# Patient Record
Sex: Female | Born: 1950 | Race: White | Hispanic: No | Smoking: Former smoker
Health system: Southern US, Community
[De-identification: ages and names within clinical notes are randomized; demographics above are authoritative.]

## PROBLEM LIST (undated history)

## (undated) ENCOUNTER — Emergency Department (HOSPITAL_COMMUNITY): Disposition: A | Discharge status: Home / Self Care | Payer: Commercial Managed Care - HMO

## (undated) DIAGNOSIS — R911 Solitary pulmonary nodule: Secondary | ICD-10-CM

## (undated) DIAGNOSIS — M19019 Primary osteoarthritis, unspecified shoulder: Secondary | ICD-10-CM

## (undated) DIAGNOSIS — F17200 Nicotine dependence, unspecified, uncomplicated: Secondary | ICD-10-CM

## (undated) DIAGNOSIS — M8580 Other specified disorders of bone density and structure, unspecified site: Secondary | ICD-10-CM

## (undated) DIAGNOSIS — Q782 Osteopetrosis: Secondary | ICD-10-CM

## (undated) DIAGNOSIS — S43429A Sprain of unspecified rotator cuff capsule, initial encounter: Secondary | ICD-10-CM

## (undated) DIAGNOSIS — J984 Other disorders of lung: Secondary | ICD-10-CM

## (undated) DIAGNOSIS — M25519 Pain in unspecified shoulder: Secondary | ICD-10-CM

## (undated) DIAGNOSIS — K589 Irritable bowel syndrome without diarrhea: Secondary | ICD-10-CM

## (undated) DIAGNOSIS — R531 Weakness: Secondary | ICD-10-CM

## (undated) DIAGNOSIS — M5416 Radiculopathy, lumbar region: Secondary | ICD-10-CM

## (undated) DIAGNOSIS — E039 Hypothyroidism, unspecified: Secondary | ICD-10-CM

## (undated) DIAGNOSIS — I499 Cardiac arrhythmia, unspecified: Secondary | ICD-10-CM

## (undated) DIAGNOSIS — R519 Headache, unspecified: Secondary | ICD-10-CM

## (undated) DIAGNOSIS — M549 Dorsalgia, unspecified: Secondary | ICD-10-CM

## (undated) DIAGNOSIS — G250 Essential tremor: Secondary | ICD-10-CM

## (undated) DIAGNOSIS — M2540 Effusion, unspecified joint: Secondary | ICD-10-CM

## (undated) DIAGNOSIS — I4719 Other supraventricular tachycardia: Secondary | ICD-10-CM

## (undated) DIAGNOSIS — E871 Hypo-osmolality and hyponatremia: Secondary | ICD-10-CM

## (undated) DIAGNOSIS — G8929 Other chronic pain: Secondary | ICD-10-CM

## (undated) DIAGNOSIS — Z888 Allergy status to other drugs, medicaments and biological substances status: Secondary | ICD-10-CM

## (undated) DIAGNOSIS — J449 Chronic obstructive pulmonary disease, unspecified: Secondary | ICD-10-CM

## (undated) DIAGNOSIS — I10 Essential (primary) hypertension: Secondary | ICD-10-CM

## (undated) DIAGNOSIS — I471 Supraventricular tachycardia, unspecified: Secondary | ICD-10-CM

## (undated) DIAGNOSIS — M2550 Pain in unspecified joint: Secondary | ICD-10-CM

## (undated) DIAGNOSIS — J690 Pneumonitis due to inhalation of food and vomit: Secondary | ICD-10-CM

## (undated) DIAGNOSIS — F419 Anxiety disorder, unspecified: Secondary | ICD-10-CM

## (undated) DIAGNOSIS — R569 Unspecified convulsions: Secondary | ICD-10-CM

## (undated) DIAGNOSIS — R1310 Dysphagia, unspecified: Secondary | ICD-10-CM

## (undated) DIAGNOSIS — F329 Major depressive disorder, single episode, unspecified: Secondary | ICD-10-CM

## (undated) DIAGNOSIS — J383 Other diseases of vocal cords: Secondary | ICD-10-CM

## (undated) DIAGNOSIS — K219 Gastro-esophageal reflux disease without esophagitis: Secondary | ICD-10-CM

## (undated) DIAGNOSIS — R51 Headache: Secondary | ICD-10-CM

## (undated) DIAGNOSIS — M51369 Other intervertebral disc degeneration, lumbar region without mention of lumbar back pain or lower extremity pain: Secondary | ICD-10-CM

## (undated) DIAGNOSIS — F32A Depression, unspecified: Secondary | ICD-10-CM

## (undated) DIAGNOSIS — M5136 Other intervertebral disc degeneration, lumbar region: Secondary | ICD-10-CM

## (undated) DIAGNOSIS — Z8709 Personal history of other diseases of the respiratory system: Secondary | ICD-10-CM

## (undated) DIAGNOSIS — F319 Bipolar disorder, unspecified: Secondary | ICD-10-CM

## (undated) DIAGNOSIS — K5900 Constipation, unspecified: Secondary | ICD-10-CM

## (undated) HISTORY — PX: BREAST SURGERY: SHX581

## (undated) HISTORY — DX: Essential (primary) hypertension: I10

## (undated) HISTORY — DX: Essential tremor: G25.0

## (undated) HISTORY — DX: Anxiety disorder, unspecified: F41.9

## (undated) HISTORY — DX: Other supraventricular tachycardia: I47.19

## (undated) HISTORY — PX: ABDOMINAL HYSTERECTOMY: SHX81

## (undated) HISTORY — PX: OTHER SURGICAL HISTORY: SHX169

## (undated) HISTORY — DX: Gastro-esophageal reflux disease without esophagitis: K21.9

## (undated) HISTORY — DX: Headache: R51

## (undated) HISTORY — DX: Supraventricular tachycardia: I47.1

## (undated) HISTORY — DX: Major depressive disorder, single episode, unspecified: F32.9

## (undated) HISTORY — PX: DENTAL SURGERY: SHX609

## (undated) HISTORY — PX: TONSILLECTOMY: SUR1361

## (undated) HISTORY — DX: Headache, unspecified: R51.9

## (undated) HISTORY — PX: FINGER SURGERY: SHX640

## (undated) HISTORY — PX: CHOLECYSTECTOMY: SHX55

## (undated) HISTORY — DX: Depression, unspecified: F32.A

## (undated) HISTORY — PX: DEEP BRAIN STIMULATOR PLACEMENT: SHX608

## (undated) HISTORY — PX: BARTHOLIN GLAND CYST EXCISION: SHX565

## (undated) HISTORY — DX: Supraventricular tachycardia, unspecified: I47.10

## (undated) HISTORY — PX: APPENDECTOMY: SHX54

## (undated) HISTORY — PX: PARTIAL HYSTERECTOMY: SHX80

## (undated) HISTORY — DX: Other diseases of vocal cords: J38.3

---

## 1999-09-24 ENCOUNTER — Inpatient Hospital Stay (HOSPITAL_COMMUNITY): Admission: AD | Admit: 1999-09-24 | Discharge: 1999-09-26 | Payer: Self-pay | Admitting: Internal Medicine

## 1999-09-24 ENCOUNTER — Encounter: Payer: Self-pay | Admitting: Internal Medicine

## 1999-09-25 ENCOUNTER — Encounter: Payer: Self-pay | Admitting: Internal Medicine

## 2000-09-10 ENCOUNTER — Encounter (INDEPENDENT_AMBULATORY_CARE_PROVIDER_SITE_OTHER): Payer: Self-pay | Admitting: Specialist

## 2000-09-10 ENCOUNTER — Inpatient Hospital Stay (HOSPITAL_COMMUNITY): Admission: AD | Admit: 2000-09-10 | Discharge: 2000-09-15 | Payer: Self-pay | Admitting: Internal Medicine

## 2000-09-10 ENCOUNTER — Encounter: Payer: Self-pay | Admitting: Internal Medicine

## 2002-04-03 ENCOUNTER — Ambulatory Visit (HOSPITAL_COMMUNITY): Admission: RE | Admit: 2002-04-03 | Discharge: 2002-04-03 | Payer: Self-pay | Admitting: Internal Medicine

## 2002-04-03 HISTORY — PX: ESOPHAGOGASTRODUODENOSCOPY (EGD) WITH ESOPHAGEAL DILATION: SHX5812

## 2003-10-13 ENCOUNTER — Emergency Department (HOSPITAL_COMMUNITY): Admission: EM | Admit: 2003-10-13 | Discharge: 2003-10-13 | Payer: Self-pay | Admitting: Emergency Medicine

## 2003-11-18 ENCOUNTER — Emergency Department (HOSPITAL_COMMUNITY): Admission: EM | Admit: 2003-11-18 | Discharge: 2003-11-18 | Payer: Self-pay | Admitting: Emergency Medicine

## 2003-11-24 ENCOUNTER — Emergency Department (HOSPITAL_COMMUNITY): Admission: EM | Admit: 2003-11-24 | Discharge: 2003-11-24 | Payer: Self-pay | Admitting: Emergency Medicine

## 2003-12-19 ENCOUNTER — Ambulatory Visit (HOSPITAL_COMMUNITY): Admission: RE | Admit: 2003-12-19 | Discharge: 2003-12-19 | Payer: Self-pay | Admitting: Family Medicine

## 2004-01-16 ENCOUNTER — Ambulatory Visit (HOSPITAL_COMMUNITY): Admission: RE | Admit: 2004-01-16 | Discharge: 2004-01-16 | Payer: Self-pay | Admitting: Internal Medicine

## 2004-01-16 HISTORY — PX: COLONOSCOPY: SHX174

## 2004-02-26 ENCOUNTER — Ambulatory Visit: Payer: Self-pay | Admitting: Family Medicine

## 2004-02-28 ENCOUNTER — Emergency Department (HOSPITAL_COMMUNITY): Admission: EM | Admit: 2004-02-28 | Discharge: 2004-02-28 | Payer: Self-pay | Admitting: *Deleted

## 2004-02-29 ENCOUNTER — Ambulatory Visit: Payer: Self-pay | Admitting: Family Medicine

## 2004-03-05 ENCOUNTER — Ambulatory Visit: Payer: Self-pay | Admitting: Family Medicine

## 2004-04-18 ENCOUNTER — Ambulatory Visit: Payer: Self-pay | Admitting: Family Medicine

## 2004-04-25 ENCOUNTER — Ambulatory Visit: Payer: Self-pay | Admitting: Internal Medicine

## 2004-05-09 ENCOUNTER — Ambulatory Visit: Payer: Self-pay | Admitting: Family Medicine

## 2004-05-12 ENCOUNTER — Ambulatory Visit: Payer: Self-pay | Admitting: Family Medicine

## 2004-06-24 ENCOUNTER — Ambulatory Visit: Payer: Self-pay | Admitting: Family Medicine

## 2004-09-22 ENCOUNTER — Ambulatory Visit: Payer: Self-pay | Admitting: Family Medicine

## 2004-10-01 ENCOUNTER — Ambulatory Visit: Payer: Self-pay | Admitting: Family Medicine

## 2004-10-02 ENCOUNTER — Emergency Department (HOSPITAL_COMMUNITY): Admission: EM | Admit: 2004-10-02 | Discharge: 2004-10-03 | Payer: Self-pay | Admitting: Emergency Medicine

## 2004-10-29 ENCOUNTER — Ambulatory Visit: Payer: Self-pay | Admitting: Internal Medicine

## 2004-12-31 ENCOUNTER — Ambulatory Visit: Payer: Self-pay | Admitting: Family Medicine

## 2005-01-07 ENCOUNTER — Ambulatory Visit: Payer: Self-pay | Admitting: Family Medicine

## 2005-02-02 ENCOUNTER — Ambulatory Visit: Payer: Self-pay | Admitting: Family Medicine

## 2005-03-23 ENCOUNTER — Ambulatory Visit: Payer: Self-pay | Admitting: Family Medicine

## 2005-03-24 ENCOUNTER — Emergency Department (HOSPITAL_COMMUNITY): Admission: EM | Admit: 2005-03-24 | Discharge: 2005-03-24 | Payer: Self-pay | Admitting: Emergency Medicine

## 2005-03-27 ENCOUNTER — Ambulatory Visit (HOSPITAL_COMMUNITY): Admission: RE | Admit: 2005-03-27 | Discharge: 2005-03-27 | Payer: Self-pay | Admitting: Family Medicine

## 2005-04-01 ENCOUNTER — Ambulatory Visit: Payer: Self-pay | Admitting: Family Medicine

## 2005-04-03 ENCOUNTER — Emergency Department (HOSPITAL_COMMUNITY): Admission: EM | Admit: 2005-04-03 | Discharge: 2005-04-03 | Payer: Self-pay | Admitting: Emergency Medicine

## 2005-04-08 ENCOUNTER — Ambulatory Visit: Payer: Self-pay | Admitting: Internal Medicine

## 2005-04-16 ENCOUNTER — Ambulatory Visit: Payer: Self-pay | Admitting: Family Medicine

## 2005-04-24 ENCOUNTER — Ambulatory Visit: Payer: Self-pay | Admitting: Internal Medicine

## 2005-06-03 ENCOUNTER — Ambulatory Visit: Payer: Self-pay | Admitting: Family Medicine

## 2005-08-05 ENCOUNTER — Ambulatory Visit: Payer: Self-pay | Admitting: Family Medicine

## 2005-09-14 ENCOUNTER — Ambulatory Visit: Payer: Self-pay | Admitting: Family Medicine

## 2005-10-05 ENCOUNTER — Ambulatory Visit: Payer: Self-pay | Admitting: Family Medicine

## 2005-10-05 ENCOUNTER — Ambulatory Visit: Payer: Self-pay | Admitting: Internal Medicine

## 2005-10-16 ENCOUNTER — Ambulatory Visit: Payer: Self-pay | Admitting: Family Medicine

## 2005-11-02 ENCOUNTER — Ambulatory Visit: Payer: Self-pay | Admitting: Family Medicine

## 2005-11-23 ENCOUNTER — Ambulatory Visit: Payer: Self-pay | Admitting: Family Medicine

## 2006-02-01 ENCOUNTER — Ambulatory Visit: Payer: Self-pay | Admitting: Family Medicine

## 2006-03-08 ENCOUNTER — Ambulatory Visit: Payer: Self-pay | Admitting: Family Medicine

## 2006-04-07 ENCOUNTER — Ambulatory Visit: Payer: Self-pay | Admitting: Internal Medicine

## 2006-05-10 ENCOUNTER — Ambulatory Visit: Payer: Self-pay | Admitting: Family Medicine

## 2006-06-07 ENCOUNTER — Ambulatory Visit: Payer: Self-pay | Admitting: Family Medicine

## 2006-06-09 ENCOUNTER — Ambulatory Visit: Payer: Self-pay | Admitting: Family Medicine

## 2006-07-05 ENCOUNTER — Ambulatory Visit: Payer: Self-pay | Admitting: Family Medicine

## 2006-07-07 ENCOUNTER — Ambulatory Visit: Payer: Self-pay | Admitting: Family Medicine

## 2006-07-08 ENCOUNTER — Emergency Department (HOSPITAL_COMMUNITY): Admission: EM | Admit: 2006-07-08 | Discharge: 2006-07-08 | Payer: Self-pay | Admitting: Emergency Medicine

## 2006-07-09 ENCOUNTER — Ambulatory Visit: Payer: Self-pay | Admitting: Family Medicine

## 2006-07-12 ENCOUNTER — Ambulatory Visit: Payer: Self-pay | Admitting: Internal Medicine

## 2006-07-15 ENCOUNTER — Ambulatory Visit: Payer: Self-pay | Admitting: Family Medicine

## 2006-07-16 ENCOUNTER — Emergency Department (HOSPITAL_COMMUNITY): Admission: EM | Admit: 2006-07-16 | Discharge: 2006-07-16 | Payer: Self-pay | Admitting: Emergency Medicine

## 2006-07-19 ENCOUNTER — Ambulatory Visit: Payer: Self-pay | Admitting: Internal Medicine

## 2006-07-19 ENCOUNTER — Inpatient Hospital Stay (HOSPITAL_COMMUNITY): Admission: AD | Admit: 2006-07-19 | Discharge: 2006-07-23 | Payer: Self-pay | Admitting: Internal Medicine

## 2006-08-17 ENCOUNTER — Ambulatory Visit: Payer: Self-pay | Admitting: Internal Medicine

## 2006-09-30 ENCOUNTER — Ambulatory Visit: Payer: Self-pay | Admitting: Family Medicine

## 2006-12-21 ENCOUNTER — Ambulatory Visit: Payer: Self-pay | Admitting: Internal Medicine

## 2007-04-19 DIAGNOSIS — F411 Generalized anxiety disorder: Secondary | ICD-10-CM | POA: Insufficient documentation

## 2007-04-19 DIAGNOSIS — G252 Other specified forms of tremor: Secondary | ICD-10-CM

## 2007-04-19 DIAGNOSIS — K219 Gastro-esophageal reflux disease without esophagitis: Secondary | ICD-10-CM | POA: Insufficient documentation

## 2007-04-19 DIAGNOSIS — J3089 Other allergic rhinitis: Secondary | ICD-10-CM

## 2007-04-19 DIAGNOSIS — E119 Type 2 diabetes mellitus without complications: Secondary | ICD-10-CM | POA: Insufficient documentation

## 2007-04-19 DIAGNOSIS — J302 Other seasonal allergic rhinitis: Secondary | ICD-10-CM | POA: Insufficient documentation

## 2007-04-19 DIAGNOSIS — G250 Essential tremor: Secondary | ICD-10-CM | POA: Insufficient documentation

## 2007-04-19 DIAGNOSIS — J441 Chronic obstructive pulmonary disease with (acute) exacerbation: Secondary | ICD-10-CM | POA: Insufficient documentation

## 2007-04-20 ENCOUNTER — Ambulatory Visit: Payer: Self-pay | Admitting: Internal Medicine

## 2007-07-05 ENCOUNTER — Ambulatory Visit: Payer: Self-pay | Admitting: Cardiology

## 2007-08-23 ENCOUNTER — Ambulatory Visit: Payer: Self-pay | Admitting: Internal Medicine

## 2007-08-25 ENCOUNTER — Ambulatory Visit: Payer: Self-pay | Admitting: Cardiovascular Disease

## 2007-09-04 DIAGNOSIS — I10 Essential (primary) hypertension: Secondary | ICD-10-CM | POA: Insufficient documentation

## 2007-09-05 ENCOUNTER — Encounter: Payer: Self-pay | Admitting: Cardiovascular Disease

## 2007-09-05 ENCOUNTER — Encounter (HOSPITAL_COMMUNITY): Admission: RE | Admit: 2007-09-05 | Discharge: 2007-10-05 | Payer: Self-pay | Admitting: Cardiovascular Disease

## 2007-09-05 ENCOUNTER — Ambulatory Visit: Payer: Self-pay | Admitting: Cardiology

## 2007-10-11 ENCOUNTER — Emergency Department (HOSPITAL_COMMUNITY): Admission: EM | Admit: 2007-10-11 | Discharge: 2007-10-11 | Payer: Self-pay | Admitting: Emergency Medicine

## 2007-12-17 ENCOUNTER — Encounter: Payer: Self-pay | Admitting: Internal Medicine

## 2007-12-19 ENCOUNTER — Ambulatory Visit: Payer: Self-pay | Admitting: Internal Medicine

## 2008-01-23 ENCOUNTER — Telehealth (INDEPENDENT_AMBULATORY_CARE_PROVIDER_SITE_OTHER): Payer: Self-pay | Admitting: *Deleted

## 2008-01-27 ENCOUNTER — Ambulatory Visit: Payer: Self-pay | Admitting: Internal Medicine

## 2008-02-13 ENCOUNTER — Ambulatory Visit: Payer: Self-pay | Admitting: Cardiology

## 2008-02-25 ENCOUNTER — Ambulatory Visit: Payer: Self-pay | Admitting: Cardiology

## 2008-02-26 ENCOUNTER — Ambulatory Visit: Payer: Self-pay | Admitting: Internal Medicine

## 2008-02-26 ENCOUNTER — Inpatient Hospital Stay (HOSPITAL_COMMUNITY): Admission: AD | Admit: 2008-02-26 | Discharge: 2008-02-28 | Payer: Self-pay | Admitting: Internal Medicine

## 2008-03-16 ENCOUNTER — Telehealth: Payer: Self-pay | Admitting: Internal Medicine

## 2008-03-23 ENCOUNTER — Ambulatory Visit: Payer: Self-pay | Admitting: Internal Medicine

## 2008-03-27 ENCOUNTER — Ambulatory Visit: Payer: Self-pay | Admitting: Internal Medicine

## 2008-06-15 ENCOUNTER — Encounter: Payer: Self-pay | Admitting: Cardiology

## 2008-06-18 ENCOUNTER — Ambulatory Visit: Payer: Self-pay | Admitting: Internal Medicine

## 2008-06-22 ENCOUNTER — Encounter: Payer: Self-pay | Admitting: Internal Medicine

## 2008-06-22 ENCOUNTER — Ambulatory Visit: Payer: Self-pay | Admitting: Internal Medicine

## 2008-06-22 DIAGNOSIS — E785 Hyperlipidemia, unspecified: Secondary | ICD-10-CM | POA: Insufficient documentation

## 2008-06-22 DIAGNOSIS — R002 Palpitations: Secondary | ICD-10-CM | POA: Insufficient documentation

## 2008-06-22 LAB — CBC WITH AUTOMATED DIFF
ABS. BASOPHILS: 0 10*3/uL (ref 0.0–0.1)
ABS. EOSINOPHILS: 0.2 10*3/uL (ref 0.0–0.4)
ABS. LYMPHOCYTES: 2.6 10*3/uL (ref 0.8–3.5)
ABS. MONOCYTES: 0.5 10*3/uL (ref 0.0–1.0)
ABS. NEUTROPHILS: 5 10*3/uL (ref 1.8–8.0)
BASOPHILS: 1 % (ref 0–1)
EOSINOPHILS: 3 % (ref 0–7)
HCT: 45.3 % (ref 35.0–47.0)
HGB: 15.5 g/dL (ref 11.5–16.0)
LYMPHOCYTES: 31 % (ref 12–49)
MCH: 33.7 PG (ref 26.0–34.0)
MCHC: 34.2 g/dL (ref 30.0–36.5)
MCV: 98.5 FL (ref 80.0–99.0)
MONOCYTES: 6 % (ref 5–13)
NEUTROPHILS: 59 % (ref 32–75)
PLATELET: 321 10*3/uL (ref 150–400)
RBC: 4.6 M/uL (ref 3.80–5.20)
RDW: 13 % (ref 11.5–14.5)
WBC: 8.4 10*3/uL (ref 3.6–11.0)

## 2008-06-22 LAB — URINALYSIS W/ RFLX MICROSCOPIC
Bilirubin: NEGATIVE
Blood: NEGATIVE
Glucose: NEGATIVE MG/DL
Ketone: NEGATIVE MG/DL
Leukocyte Esterase: NEGATIVE
Nitrites: NEGATIVE
Protein: NEGATIVE MG/DL
Specific gravity: 1.018 (ref 1.003–1.030)
Urobilinogen: 0.2 EU/DL (ref 0.2–1.0)
pH (UA): 6.5 (ref 5.0–8.0)

## 2008-06-23 LAB — METABOLIC PANEL, BASIC
Anion gap: 11 mmol/L (ref 5–15)
BUN/Creatinine ratio: 20 (ref 12–20)
BUN: 14 MG/DL (ref 6–20)
CO2: 28 MMOL/L (ref 21–32)
Calcium: 9.7 MG/DL (ref 8.5–10.1)
Chloride: 103 MMOL/L (ref 97–108)
Creatinine: 0.7 MG/DL (ref 0.6–1.3)
GFR est AA: >60 mL/min/{1.73_m2} (ref >60)
GFR est non-AA: >60 mL/min/{1.73_m2} (ref >60)
Glucose: 78 MG/DL (ref 50–100)
Potassium: 4.5 MMOL/L (ref 3.5–5.1)
Sodium: 142 MMOL/L (ref 136–145)

## 2008-06-24 LAB — CULTURE, URINE
Colonies Counted: <1000
Colony Count: <1000
Culture result:: NO GROWTH
Culture: NO GROWTH

## 2008-07-02 ENCOUNTER — Emergency Department (HOSPITAL_COMMUNITY): Admission: EM | Admit: 2008-07-02 | Discharge: 2008-07-02 | Payer: Self-pay | Admitting: Emergency Medicine

## 2008-07-03 NOTE — Op Note (Signed)
Name: Diane Riggs, Diane Riggs  MR #: [id number] Surgeon: Benjamine Sprague. Melody Haver, MD  Account #: [id number] Surgery Date: 07/03/2008  DOB: Mar 27, 1951  Age: 58 Location: 3W 359 A     OPERATIVE REPORT      ATTENDING SURGEON: Santa Lighter, MD    PREOPERATIVE DIAGNOSIS: Cystocele.    POSTOPERATIVE DIAGNOSES:  1. Cystocele.  2. Incomplete vault prolapse.    PROCEDURE:  1. Cystocele repair with uphold masses.  2. Bilateral sacrospinous ligament suspension.    ANESTHESIA: General.    ESTIMATED BLOOD LOSS: Less than 100 mL.    FLUIDS: A liter of crystalloid.    URINE OUTPUT: Not measured.    SPECIMENS: None.    COMPLICATIONS: None.    FINDINGS: Grade 3 cystocele with incomplete vault prolapse stage II to  III.    INDICATIONS: This is a 58 year old G1, P70 female, status post hysterectomy,  who presents today for primary repair of symptomatic prolapse. The patient  suffers from progressive bulging. She has some emptying problems. She  underwent urodynamics and was found to need a midurethral sling, which will  be done today by Dr. Chilton Si. Office examination reveals a large anterior  cystocele.    DESCRIPTION OF THE PROCEDURE: The initial step in this procedure is  placement of her sling, which is dictated separately by Dr. Chilton Si.    Once the sling was completed, the repair was begun. Of note, examination  under anesthesia prior to sling revealed that in addition to the large  cystocele, she had a moderate degree of vaginal vault prolapse down to  about stage II to III. The repair was begun by removing the weighted  speculum and using the LoneStar retractor for vaginal wall retraction. The  bladder was drained and then clamped off. The dissection of the vaginal  wall was begun by grasping the midline of the vagina with Allis clamps and  then infiltrated with 0.5% Marcaine and epinephrine. The initial incision  was made transversely at the level of the bladder neck. Dissection was   begun here to dissect the bladder off the anterior vaginal wall. However,  her dissection planes are difficult to evaluate from this type of incision  and therefore rather than extending it, I am going to dissect upwards to  the sling incision, which was done and opened in the midline. Sharp and  blunt dissections are then used to dissect the bladder off of a  full-thickness vaginal wall laterally to the pubic rami and proximally to  the cuff of the vagina. Bleeding points along the way were controlled with  cautery. With careful dissection, the paravaginal spaces are entered on  either side until the ischial spines can be palpated which are quite  prominent as she is a very thin woman and her sacrospinous ligaments are in  great shape easily palpated on either side. The uphold mesh is selected  since she will need for vault support and using the Capio suture device,  the introducer arms were placed into the ligaments, approximately 2 cm  medial to the spine with direct palpation and tagged. Rectal examination  was performed with an over glove and no foreign bodies noted. Gloves are  changed. The bottom of the mesh was anchored to the vaginal apex with  resected separate interrupted 0 Vicryl sutures. The mesh arms were then  brought into the ligament after withdrawing the vaginal wall from the  LoneStar retractor. With sequential tightening, the mesh was brought under  proper location  avoiding too much tension. Once I am satisfied here, the  plastic sleeves and sutures were removed from both arms. The apex of the  mesh was attached to the midline of the pubocervical fascia with a single 0  Vicryl suture. The area was now copiously irrigated. There was a small  amount of plication needed anterior to the mesh, which was done with 2  separate interrupted 2-0 Tycron sutures. The antibiotic irrigation was  performed. A 5 mL of FloSeal 2-1/2 into each paravaginal space was used to   assure hemostasis. The area of dissection was actually fairly dry at this  point. The vaginal wall was trimmed only slightly in the midline to even up  both edges and then closed with a running locking 2-0 Vicryl suture. The  vagina was irrigated and then packed with vaginal packing and Cleocin  cream. The Foley catheter was attached to the bag. All sponge and  instrument counts were correct. She tolerated the procedure well and left  the operating room awake in stable condition.          E-Signed By  Benjamine Sprague. Melody Haver, MD 07/08/2008 16:25  Benjamine Sprague. Melody Haver, MD    cc: Margit Banda. Adela Ports, M.D.   Benjamine Sprague. Melody Haver, MD        LMT/FI3; D: 07/03/2008 11:15 A; T: 07/03/2008 12:55 P; Doc# [id number]; Job#  [id number]

## 2008-07-03 NOTE — Op Note (Signed)
Name: Diane Riggs, Diane Riggs  MR #: [id number] Surgeon: Elenora Gamma. Chilton Si, M.D.  Account #: [id number] Surgery Date: 07/03/2008  DOB: 1951-03-11  Age: 58 Location: [facility]     OPERATIVE REPORT      PREOPERATIVE DIAGNOSIS: Incontinence.    POSTOPERATIVE DIAGNOSIS: Incontinence.    PROCEDURE: Solyx midurethral sling with polypropylene mesh.    ANESTHESIA: General.    COMPLICATIONS: None.    SPECIMENS: None.    FINDINGS: As below.    ESTIMATED BLOOD LOSS: 25 mL.  INDICATIONS: This is a 58 year old female with a history of incontinence  demonstrated on Urodynamic study. She has gynecologic issues as well.  She comes now for this combined procedure by myself and Dr Janee Morn. She  is aware of the risks, options, and alternatives. We have discussed this  in the office, as well as the preoperative area. She understands and  wishes to proceed.    PROCEDURE: In the operating room, general anesthesia was induced with no  complications. She had received IV antibiotics in the preoperative area.  DVT prophylaxis was used. She was placed in the dorsal lithotomy position  and prepped and draped in sterile fashion. A Foley catheter was placed. A  Lone Star retractor was used for better visualization. I infiltrated the  anterior vaginal wall with Marcaine with epinephrine. I then incised the  anterior vaginal wall in the midline going from the midurethral area down  towards the bladder. Using a combination of sharp and blunt dissection, I  dissected the anterior vaginal wall off the underlying tissues until I was  out towards the ischiopubic ramus on the right side and on the left side.  I then took the [facility] delivery device and I placed it in the anterior  vaginal wall incision and aimed it towards the underside of the pubic ramus  on the right side. I then slowly advanced the Solyx device until I felt to  the tip go through the obturator internus muscle. I then released the   device and left the sling in place. I then turned my attention to the  contralateral side. The sling was loaded back onto the device. At this  time, the device was placed into the underside of the left ischiopubic  rami. I advanced the device slowly until I was happy with where the  tension was on the sling. The device was then deployed, leaving the sling  in place. I checked the sling with a right angle clamp, and I was happy  with the amount of tension on it. The area was thoroughly irrigated with  antibiotic irrigation. I then placed some Surgicel packing alongside the  sling on the right and left side and began my closure. The anterior  vaginal wall was closed with a running 2-0 Vicryl suture, and this portion  of the surgery was then terminated. She tolerated the procedure well with  no complications. The case was then turned over to Dr Janee Morn.          E-Signed By  Elenora Gamma Chilton Si, M.D. 07/06/2008 07:37  Anastasia Fiedler S. Chilton Si, M.D.    cc: Elenora Gamma. Chilton Si, M.D.        LSG/FI3; D: 07/03/2008 10:14 A; T: 07/03/2008 11:15 A; Doc# [id number]; Job#  [id number]

## 2008-07-03 NOTE — Op Note (Signed)
Op Notes signed by  at 07/08/08 1625                  Author: Philomena Coursehompson, Haedyn Breau Riggs  Service: --  Author Type: Physician       Filed: 07/08/08 1625  Date of Service: 07/03/08 1255  Status: Signed          Editor: Philomena Coursehompson, Diane Riggs          <!--EPICS--> Name:      Diane Riggs, [name]:      [id number]                    Surgeon:        Benjamine SpragueLouis Riggs. Janee Riggs, [name], MD<BR> Account #:  [id number]                 Surgery Date:   07/03/2008<BR> DOB:       September 06, 1950<BR> Age:       58                           Location:       3W  359 A<BR> <BR>                              OPERATIVE REPORT<BR> <BR> <BR> ATTENDING SURGEON: Diane LighterLouis Jr Milliron,  MD<BR> <BR> PREOPERATIVE DIAGNOSIS: Cystocele.<BR> <BR> POSTOPERATIVE DIAGNOSES:<BR> 1.     Cystocele.<BR> 2.     Incomplete vault prolapse.<BR> <BR> PROCEDURE:<BR> 1.     Cystocele repair with uphold masses.<BR> 2.     Bilateral sacrospinous ligament  suspension.<BR> <BR> ANESTHESIA: General.<BR> <BR> ESTIMATED BLOOD LOSS: Less than 100 mL.<BR> <BR> FLUIDS: A liter of crystalloid.<BR> <BR> URINE OUTPUT: Not measured.<BR> <BR> SPECIMENS: None.<BR> <BR> COMPLICATIONS: None.<BR> <BR> FINDINGS: Grade 3  cystocele with incomplete vault prolapse stage II to<BR> III.<BR> <BR> INDICATIONS: This is a 58 year old G1, 82P1 female, status post hysterectomy,<BR> who presents today for primary repair of symptomatic prolapse. The patient<BR> suffers from progressive  bulging. She has some emptying problems. She<BR> underwent urodynamics and was found to need a midurethral sling, which will<BR> be done today by Dr. Chilton SiGreen. Office examination reveals a large anterior<BR> cystocele.<BR> <BR> DESCRIPTION OF THE PROCEDURE:  The initial step in this procedure is<BR> placement of her sling, which is dictated separately by Dr. Eyvonne Riggs.<BR> <BR> Once the sling was completed, the repair was begun. Of note, examination<BR> under anesthesia prior to sling revealed that in addition  to the large<BR> cystocele,  she had a moderate degree of vaginal vault prolapse down to<BR> about stage II to III. The repair was begun by removing the weighted<BR> speculum and using the LoneStar retractor for vaginal wall retraction. The<BR> bladder  was drained and then clamped off. The dissection of the vaginal<BR> wall was begun by grasping the midline of the vagina with Allis clamps and<BR> then infiltrated with 0.5% Marcaine and epinephrine. The initial incision<BR> was made transversely at the  level of the bladder neck. Dissection was<BR> begun here to dissect the bladder off the anterior vaginal wall. However,<BR> her dissection planes are difficult to evaluate from this type of incision<BR> and therefore rather than extending it, I am going  to dissect upwards to<BR> the sling incision, which was done and opened in the midline. Sharp and<BR> blunt dissections are then used to dissect the bladder off of a<BR> full-thickness vaginal wall laterally to the pubic rami and proximally to<BR> the  cuff of the vagina. Bleeding points along the way were controlled with<BR> cautery. With careful dissection, the paravaginal spaces are entered on<BR> either side until the ischial spines can be palpated which are quite<BR> prominent as she is a very  thin woman and her sacrospinous ligaments are in<BR> great shape easily palpated on either side. The uphold mesh is selected<BR> since she will need for vault support and using the Capio suture device,<BR> the introducer arms were placed into the ligaments,  approximately 2 cm<BR> medial to the spine with direct palpation and tagged. Rectal examination<BR> was performed with an over glove and no foreign bodies noted. Gloves are<BR> changed. The bottom of the mesh was anchored to the vaginal apex with<BR>  resected separate interrupted 0 Vicryl sutures. The mesh arms were then<BR> brought into the ligament after withdrawing the vaginal wall from the<BR> LoneStar retractor. With sequential tightening,  the mesh was brought under<BR> proper location avoiding  too much tension. Once I am satisfied here, the<BR> plastic sleeves and sutures were removed from both arms. The apex of the<BR> mesh was attached to the midline of the pubocervical fascia with a single 0<BR> Vicryl suture. The area was now copiously  irrigated. There was a small<BR> amount of plication needed anterior to the mesh, which was done with 2<BR> separate interrupted 2-0 Tycron sutures. The antibiotic irrigation was<BR> performed. A 5 mL of FloSeal 2-1/2 into each paravaginal space was used  to<BR> assure hemostasis. The area of dissection was actually fairly dry at this<BR> point. The vaginal wall was trimmed only slightly in the midline to even up<BR> both edges and then closed with a running locking 2-0 Vicryl suture. The<BR> vagina was  irrigated and then packed with vaginal packing and Cleocin<BR> cream. The Foley catheter was attached to the bag. All sponge and<BR> instrument counts were correct. She tolerated the procedure well and left<BR> the operating room awake in stable condition.<BR>  <BR> <BR> <BR> <BR> E-Signed By<BR> Lache Dagher Riggs. Melody Haver, MD 07/08/2008 16:25<BR> Benjamine Sprague. Janee Morn, III, MD<BR> <BR> cc:   Margit Banda. Adela Ports, Riggs.D.<BR>       Benjamine Sprague. Romano Stigger, III, MD<BR> <BR> <BR> <BR> LMT/FI3; D: 07/03/2008 11:15 A; T: 07/03/2008  12:55 P; Doc# [id number]; Job#<BR> [id number]<BR> <!--EPICE-->

## 2008-07-03 NOTE — Op Note (Signed)
Op Notes signed by  at 07/06/08 0737                  Author: Darrold Span  Service: --  Author Type: Physician            Filed:   Date of Service: 07/03/08 1115  Status: Signed          <!--EPICS--> Name:      Diane Riggs, Diane Riggs MR #:      [id number]                    Surgeon:        Diane Riggs, M.D.<BR> Account #: [id number]                  Surgery Date:   07/03/2008<BR> DOB:       [birth year]<BR> Age:       58                           Location:       [facility]<BR> <BR>                              OPERATIVE REPORT<BR> <BR> <BR> PREOPERATIVE DIAGNOSIS:   Incontinence.<BR>  <BR> POSTOPERATIVE DIAGNOSIS:   Incontinence.<BR> <BR> PROCEDURE:   Solyx midurethral sling with polypropylene mesh.<BR> <BR> ANESTHESIA:   General.<BR> <BR> COMPLICATIONS:   None.<BR> <BR> SPECIMENS:   None.<BR> <BR> FINDINGS:   As below.<BR> <BR> ESTIMATED  BLOOD LOSS:  25 mL.<BR> INDICATIONS:   This is a 58 year old female with a history of incontinence<BR> demonstrated on Urodynamic study.   She has gynecologic issues as well.<BR> She comes now for this combined procedure by myself and Dr Janee Morn.   She<BR>  is aware of the risks, options, and alternatives.  We have discussed this<BR> in the office, as well as the preoperative area.  She understands and<BR> wishes to proceed.<BR> <BR> PROCEDURE:   In the operating room, general anesthesia was induced with  no<BR> complications.  She had received IV antibiotics in the preoperative  area.<BR> DVT prophylaxis was used.   She was placed in the dorsal lithotomy position<BR> and prepped and draped in sterile fashion.  A Foley catheter was placed.  A<BR> Lone  Star retractor was used for better visualization.  I infiltrated the<BR> anterior vaginal wall with Marcaine with epinephrine.  I then incised the<BR> anterior vaginal wall in the midline going from the midurethral area down<BR> towards the bladder.   Using a combination of sharp and blunt dissection, I<BR> dissected the  anterior vaginal wall off the underlying tissues until I was<BR> out towards the ischiopubic ramus on the right side and on the left side.<BR> I then took the [facility] delivery device  and I placed it in the anterior<BR> vaginal wall incision and aimed it towards the underside of the pubic ramus<BR> on the right side.  I then slowly advanced the Solyx device until I felt to<BR> the tip go through the obturator internus muscle.  I then  released the<BR> device and left the sling in place.  I then turned my attention to the<BR> contralateral side.  The sling was loaded back onto the device.  At this<BR> time, the device was placed into the underside of the left ischiopubic<BR> rami.   I advanced the device slowly until I was happy with where the<BR> tension was on the sling.  The device  was then deployed, leaving the sling<BR> in place.  I checked the sling with a right angle clamp, and I was happy<BR> with the amount of tension on  it.  The area was thoroughly irrigated with<BR> antibiotic irrigation.  I then placed some Surgicel packing alongside the<BR> sling on the right and left side and began my closure.  The anterior<BR> vaginal wall was closed with a running 2-0 Vicryl suture,  and this portion<BR> of the surgery was then terminated.  She tolerated the procedure well with<BR> no complications.  The case was then turned over to Dr Janee Morn.<BR> <BR> <BR> <BR> <BR> E-Signed By<BR> Diane Riggs, M.D. 07/06/2008 07:37<BR> Diane Riggs, M.D.<BR> <BR> cc:   Diane Riggs, M.D.<BR> <BR> <BR> <BR> LSG/FI3; D: 07/03/2008 10:14 A; T: 07/03/2008 11:15 A; Doc# [id number]; Job#<BR> [id number]<BR> <!--EPICE-->

## 2008-07-04 ENCOUNTER — Encounter: Payer: Self-pay | Admitting: Cardiology

## 2008-07-04 LAB — CBC WITH AUTOMATED DIFF
ABS. BASOPHILS: 0 10*3/uL (ref 0.0–0.1)
ABS. EOSINOPHILS: 0 10*3/uL (ref 0.0–0.4)
ABS. LYMPHOCYTES: 2.1 10*3/uL (ref 0.8–3.5)
ABS. MONOCYTES: 1 10*3/uL (ref 0.0–1.0)
ABS. NEUTROPHILS: 7.9 10*3/uL (ref 1.8–8.0)
BASOPHILS: 0 % (ref 0–1)
EOSINOPHILS: 0 % (ref 0–7)
HCT: 35.7 % (ref 35.0–47.0)
HGB: 12.3 g/dL (ref 11.5–16.0)
LYMPHOCYTES: 19 % (ref 12–49)
MCH: 33.4 PG (ref 26.0–34.0)
MCHC: 34.5 g/dL (ref 30.0–36.5)
MCV: 97 FL (ref 80.0–99.0)
MONOCYTES: 9 % (ref 5–13)
NEUTROPHILS: 72 % (ref 32–75)
PLATELET: 231 10*3/uL (ref 150–400)
RBC: 3.68 M/uL — ABNORMAL LOW (ref 3.80–5.20)
RDW: 12.8 % (ref 11.5–14.5)
WBC: 11 10*3/uL (ref 3.6–11.0)

## 2008-07-04 NOTE — Discharge Summary (Signed)
Name: Diane Riggs, OELKERS Admitted: 07/03/2008  MR #: [id number] Discharged: 07/04/2008  Account #: [id number] DOB: 30-Sep-1950  Physician: Benjamine Sprague. Melody Haver, MD Age 58     DISCHARGE SUMMARY        PRINCIPAL DIAGNOSES:  1. Cystocele.  2. Incomplete vault prolapse.  3. Incontinence.    PRINCIPAL PROCEDURES:  1. Cystocele repair with Uphold mesh.  2. Bilateral sacrospinous ligament suspension.  3. Solyx midurethral sling.    HISTORY OF PRESENT ILLNESS: Please refer to the H and P in the chart.    HOSPITAL COURSE: The patient was admitted to [facility] on  07/03/2008, at which time she underwent the above repairs by myself and Dr.  Darrold Span. Postoperatively, she has done great. She has been afebrile  with stable vital signs throughout. Packing was removed on postoperative  day #1 and bleeding has been minimal. Hemoglobin returned at 12.3 g/dL.  She has passed her voiding trial and is on a regular diet. Pain is well  controlled.    DISPOSITION: To home.    CONDITION: Her condition is stable.    FOLLOWUP: Her followup will be in 4 weeks in the office.    MEDICINES ON DISCHARGE:  1. Percocet 1 to 2 every 4 hours as needed for pain.  2. Cipro 500 mg 2 times a day for 7 days.  3. She is encouraged to use a stool softener.            E-Signed By  Benjamine Sprague. Melody Haver, MD 07/08/2008 16:25    Benjamine Sprague. Melody Haver, MD    cc: Benjamine Sprague. Melody Haver, MD        LMT/FI3; D: 07/04/2008 8:05 A; T: 07/04/2008 10:20 P; DOC# [id number]; Job#  [id number]

## 2008-07-07 ENCOUNTER — Encounter: Payer: Self-pay | Admitting: Cardiology

## 2008-07-25 ENCOUNTER — Telehealth: Payer: Self-pay | Admitting: Internal Medicine

## 2008-08-24 ENCOUNTER — Ambulatory Visit: Payer: Self-pay | Admitting: Cardiology

## 2008-08-30 ENCOUNTER — Ambulatory Visit: Payer: Self-pay | Admitting: Cardiology

## 2008-08-30 ENCOUNTER — Encounter: Payer: Self-pay | Admitting: Cardiology

## 2008-09-06 ENCOUNTER — Ambulatory Visit: Payer: Self-pay | Admitting: Cardiology

## 2008-11-14 ENCOUNTER — Encounter: Payer: Self-pay | Admitting: Cardiology

## 2008-11-20 ENCOUNTER — Encounter: Payer: Self-pay | Admitting: Cardiology

## 2008-11-20 ENCOUNTER — Ambulatory Visit: Payer: Self-pay | Admitting: Cardiology

## 2008-11-20 DIAGNOSIS — F329 Major depressive disorder, single episode, unspecified: Secondary | ICD-10-CM | POA: Insufficient documentation

## 2008-12-17 ENCOUNTER — Ambulatory Visit: Payer: Self-pay | Admitting: Internal Medicine

## 2009-01-06 ENCOUNTER — Encounter: Payer: Self-pay | Admitting: Cardiology

## 2009-03-18 ENCOUNTER — Ambulatory Visit: Payer: Self-pay | Admitting: Cardiology

## 2009-06-17 ENCOUNTER — Ambulatory Visit: Payer: Self-pay | Admitting: Internal Medicine

## 2009-06-24 ENCOUNTER — Ambulatory Visit: Payer: Self-pay | Admitting: Cardiology

## 2009-06-24 DIAGNOSIS — R000 Tachycardia, unspecified: Secondary | ICD-10-CM | POA: Insufficient documentation

## 2009-12-16 ENCOUNTER — Ambulatory Visit: Payer: Self-pay | Admitting: Internal Medicine

## 2010-05-06 NOTE — Assessment & Plan Note (Signed)
Summary: 6 months/apc   Primary Provider/Referring Provider:  [name] (Health Dept)  CC:  6 month follow up visit-asthma and allergies; "doing good"..  History of Present Illness: 12/17/08- Asthma, allergic rhinitis, VCD/ Hx SVT/ ablation Since I last saw her she was hosp with dyspnea and tachypnea in March and has maintained close f/u with her cardiologist in [place]. Now out of work several months. That helps her dyspnea/ VCD component.  Wheeze, especially in afternoons. Had had very occasional heartburn but no strangle or choke. Perfumes and strong smells are triggers. She has 3 dogs and a cat. Has her neb solution, but almost out of Proair and has been out of Advair.  June 17, 2009- Asthma, allergic rhinitis, VCD/ Hx SVT/ablation Since last here had CXR 01/06/09- WNL, NAD. Has had f/u with cardiology for rhythm. Notes occasional palpitation. She denies dyspnea with this and she has learned to relax til it resolves. Continues metoprolol. Went to ER twice this winter with "bad bouts of bronchitis" treated with Z pak. She needed her nebulizer then, otherwise averaging about once every 2 months. Aware of more postnasal drip now in pollen season. Uses benadryl up to three times a day which can make her drowsy enough not to drive.  Not working so has no insurance. Gets some meds through health department.  December 16, 2009- Asthma, allergic rhinitis, VCD, Hx SVT/ ablation, DM One ER visit for asthma at [facility]  one month ago - got solumedrol and neb, no prednisone.. 2 weeks ago had EGD- no respiratory problems.  Stress was probable trigger for ER visit. WTW on flu vax.  Some reflux. Chest stays clear unless she gets bad enough she goes to ER. Uses neb or rescue inhaler less than once/ week. Continues Advair two times a day.     Asthma History    Initial Asthma Severity Rating:    Age range: 12+ years    Symptoms: 0-2 days/week    Nighttime Awakenings: 0-2/month    Interferes w/ normal  activity: no limitations    SABA use (not for EIB): 0-2 days/week    Asthma Severity Assessment: Intermittent   Preventive Screening-Counseling & Management  Alcohol-Tobacco     Smoking Status: quit     Year Quit: 1987  Current Medications (verified): 1)  Advair Diskus 250-50 Mcg/dose  Misc (Fluticasone-Salmeterol) .... Inhale 1 Puff Two Times A Day 2)  Proventil Hfa 108 (90 Base) Mcg/act Aers (Albuterol Sulfate) .... 2 Puffs Four Times A Day As Needed 3)  Benadryl 25 Mg  Tabs (Diphenhydramine Hcl) .Marland Kitchen.. 1 Tabs At Bedtime As Needed 4)  Duoneb 2.5-0.5 Mg/62ml  Soln (Albuterol-Ipratropium) .... Four Times A Day As Needed 5)  Bayer Aspirin 325 Mg Tabs (Aspirin) .... Take 1 Tablet By Mouth Once A Day 6)  Metoprolol Tartrate 50 Mg Tabs (Metoprolol Tartrate) .... Take One Tablet By Mouth Twice A Day 7)  Zantac 150mg  .... 1 By Mouth Two Times A Day As Needed 8)  Tylenol Pm Extra Strength 500-25 Mg Tabs (Diphenhydramine-Apap (Sleep)) .... Per Bottle 9)  Humalog 100 Unit/ml Soln (Insulin Lispro (Human)) .... Use As Directed 10)  Lantus 100 Unit/ml Soln (Insulin Glargine) .... Use As Directed 11)  Benicar Hct 40-25 Mg Tabs (Olmesartan Medoxomil-Hctz) .... Take 1 Tablet By Mouth Once A Day 12)  Zoloft 100 Mg Tabs (Sertraline Hcl) .... Take 1 By Mouth Once Daily 13)  Metformin Hcl 500 Mg Tabs (Metformin Hcl) .... Take 1 By Mouth Two Times A Day 14)  Tessalon 200 Mg Caps (Benzonatate) .... Take 1 By Mouth Three Times A Day As Needed 15)  Otic Edge 5.4-1.4-0.0097 % Soln (Antipyrine-Benzocaine-Polycos) .Marland Kitchen.. 1-2 Drops in Ear Three Times A Day As Needed Pain 16)  Coricidin Hbp Cough/cold 4-30 Mg Tabs (Chlorpheniramine-Dm) .... As Needed Cough  Allergies (verified): 1)  ! 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 HYPERLIPIDEMIA-MIXED (ICD-272.4) ESSENTIAL HYPERTENSION (ICD-401.9) * VOCAL CORD DYSFUNCTION DIABETES MELLITUS  (ICD-250.00) ESOPHAGEAL REFLUX (ICD-530.81) TREMOR, ESSENTIAL (ICD-333.1) ANXIETY (ICD-300.00) ALLERGIC RHINITIS (ICD-477.9) ASTHMA (ICD-493.90)  Past Surgical History: Last updated: 06/22/2008 Tonsils R Breast cyst L ankle ligament repair Left elbow repair after fall Partial hysterectomy Cholescystectomy C-section Ablation for AVNRT     Family History: Last updated: 02-Feb-2008 Father  died age 62 from lung  cancer Mother died  age 77 from COPD, MRSA pneumonia 1 brother alive age 21  hx of copd Sister died 53yo- smoker, ?heart  Social History: Last updated: 12/17/2008 married 1 child living 1child died brain tumor at age 63 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  The patient denies shortness of breath with activity, shortness of breath at rest, productive cough, non-productive cough, coughing up blood, chest pain, indigestion, loss of appetite, weight change, abdominal pain, difficulty swallowing, sore throat, tooth/dental problems, headaches, nasal congestion/difficulty breathing through nose, sneezing, rash, change in color of mucus, and fever.    Vital Signs:  Patient profile:   60 year old female Height:      64 inches Weight:      214.13 pounds BMI:     36.89 O2 Sat:      99 % on Room air Pulse rate:   102 / minute BP sitting:   110 / 60  (right arm) Cuff size:   large  Vitals Entered By: Reynaldo Minium CMA (December 16, 2009 9:15 AM)  O2 Flow:  Room air CC: 6 month follow up visit-asthma and allergies; "doing good".   Physical Exam  Additional Exam:  General: A/Ox3; pleasant and cooperative, NAD, better mood, cheerful, overweight  SKIN: no rash,  lesions NODES: no lymphadenopathy HEENT: [place]/AT, EOM- WNL, Conjuctivae- clear, PERRLA, TM-WNL, Nose- clear, wet, Throat- clear and wnl,  tremulous vocal pattern NECK: Supple w/ fair ROM, JVD- none, normal carotid impulses w/o bruits Thyroid-  CHEST: Clear to P&A, no wheeze, HEART: rhythm is now regular, no m/g/r heard ABDOMEN: overweight ZOX:WRUE, nl pulses, no edema  NEURO: Grossly intact to observation      Impression & Recommendations:  Problem # 1:  ASTHMA (ICD-493.90) Good control and medication use. Episodic flares have been mostly stress/ anxiety issues. The ER tries to avoid prednisone because of her blood sugar.  Discussed flu vaccine and she chose to get it today after all Asks refill cough syrup Asks help for acute nervous issues- Has been borrowing from her husband.Caren Hazy Zoloft at 100 mg daily. Discussed Xanax.  Problem # 2:  ALLERGIC RHINITIS (ICD-477.9)  Good control now. Her updated medication list for this problem includes:    Benadryl 25 Mg Tabs (Diphenhydramine hcl) .Marland Kitchen... 1 tabs at bedtime as needed  Problem # 3:  ANXIETY (ICD-300.00)  As above, we are going to trysome xanax. discussed tolerance and dependency concerns. This will be  for occasional use only.We were frank about the importance of acute stress and anxiety issues as trigger for her attacks of "asthma" Her updated medication list for this problem includes:    Zoloft 100 Mg Tabs (Sertraline hcl) .Marland Kitchen... Take 1 by mouth once daily    Alprazolam 0.5 Mg Tabs (Alprazolam) .Marland Kitchen... 1 three times a day as needed nerves  Medications Added to Medication List This Visit: 1)  Proventil Hfa 108 (90 Base) Mcg/act Aers (Albuterol sulfate) .... 2 puffs four times a day as needed 2)  Metoprolol Tartrate 50 Mg Tabs (Metoprolol tartrate) .... Take one tablet by mouth twice a day 3)  Zoloft 100 Mg Tabs (Sertraline hcl) .... Take 1 by mouth once daily 4)  Alprazolam 0.5 Mg Tabs (Alprazolam) .Marland Kitchen.. 1 three times a day as  needed nerves 5)  Promethazine-codeine 6.25-10 Mg/62ml Syrp (Promethazine-codeine) .Marland Kitchen.. 1 teaspoon four times a day as needed cough  Other Orders: Admin 1st Vaccine (16109) Flu Vaccine 40yrs + (60454) Est. Patient Level III (09811)  Patient Instructions: 1)  Please schedule a follow-up appointment in 6 months. 2)  Scripts for alprazolam and for prometh/ coeine cough syrup 3)  Flu vax Prescriptions: PROMETHAZINE-CODEINE 6.25-10 MG/5ML SYRP (PROMETHAZINE-CODEINE) 1 teaspoon four times a day as needed cough  #200 ml x 0   Entered and Authorized by:   Waymon Budge MD   Signed by:   Waymon Budge MD on 12/16/2009   Method used:   Print then Give to Patient   RxID:   [id number] ALPRAZOLAM 0.5 MG TABS (ALPRAZOLAM) 1 three times a day as needed nerves  #30 x 3   Entered and Authorized by:   Waymon Budge MD   Signed by:   Waymon Budge MD on 12/16/2009   Method used:   Print then Give to Patient   RxID:   [id number]    Flu Vaccine Consent Questions     Do you have a history of severe allergic reactions to this vaccine? no    Any prior history of allergic reactions to egg and/or gelatin? no    Do you have a sensitivity to the preservative Thimersol? no    Do you have a past history of Guillan-Barre Syndrome? no    Do you currently have an acute febrile illness? no    Have you ever had a severe reaction to latex? no    Vaccine information given and explained to patient? yes    Are you currently pregnant? no    Lot Number:[id number]   Exp Date:10/04/2010   Site Given  Right Deltoid IMbflu Elizabeth Miyamoto RN  December 16, 2009 10:05 AM

## 2010-05-06 NOTE — Assessment & Plan Note (Signed)
Summary: rov 6 months///kp   Primary Provider/Referring Provider:  [name]  CC:  6 month follow up visit.  History of Present Illness:  06/18/08- Asthma, allergic rhinitis, VCD/, Hx SVT/ablation Had ablation done in November and did well through winter but spent a night a [place] this past weekend for substernal pain and tachycardia. Discharged on increased metoprolol. Some reflux/GERD and some post nasal drip with yellow geen sputum for which she is now on a Z pak. Otherwise no significant respiratory problem this winter.Needs refill lorazepam.  12/17/08- Asthma, allergic rhinitis, VCD/ Hx SVT/ ablation Since I last saw her she was hosp with dyspnea and tachypnea in March and has maintained close f/u with her cardiologist in [place]. Now out of work several months. That helps her dyspnea/ VCD component.  Wheeze, especially in afternoons. Had had very occasional heartburn but no strangle or choke. Perfumes and strong smells are triggers. She has 3 dogs and a cat. Has her neb solution, but almost out of Proair and has been out of Advair.  June 17, 2009- Asthma, allergic rhinitis, VCD/ Hx SVT/ablation Since last here had CXR 01/06/09- WNL, NAD. Has had f/u with cardiology for rhythm. Notes occasional palpitation. She denies dyspnea with this and she has learned to relax til it resolves. Continues metoprolol. Went to ER twice this winter with "bad bouts of bronchitis" treated with Z pak. She needed her nebulizer then, otherwise averaging about once every 2 months. Aware of more postnasal drip now in pollen season. Uses benadryl up to three times a day which can make her drowsy enough not to drive.  Not working so has no insurance. Gets some meds through health department.    Current Medications (verified): 1)  Advair Diskus 250-50 Mcg/dose  Misc (Fluticasone-Salmeterol) .... Inhale 1 Puff Two Times A Day 2)  Proair Hfa 108 (90 Base) Mcg/act Aers (Albuterol Sulfate) .... 2 Puffs Four Times A  Day As Needed 3)  Benadryl 25 Mg  Tabs (Diphenhydramine Hcl) .Marland Kitchen.. 1 Tabs At Bedtime As Needed 4)  Duoneb 2.5-0.5 Mg/49ml  Soln (Albuterol-Ipratropium) .... Four Times A Day As Needed 5)  Bayer Aspirin 325 Mg Tabs (Aspirin) .... Take 1 Tablet By Mouth Once A Day 6)  Metoprolol Tartrate 50 Mg Tabs (Metoprolol Tartrate) .... Take One Tablet By Mouth Twice A Day 7)  Zantac 150mg  .... 1 By Mouth Two Times A Day As Needed 8)  Tylenol Pm Extra Strength 500-25 Mg Tabs (Diphenhydramine-Apap (Sleep)) .... Per Bottle 9)  Humalog 100 Unit/ml Soln (Insulin Lispro (Human)) .... Use As Directed 10)  Lantus 100 Unit/ml Soln (Insulin Glargine) .... Use As Directed 11)  Benicar Hct 40-25 Mg Tabs (Olmesartan Medoxomil-Hctz) .... Take 1 Tablet By Mouth Once A Day 12)  Zoloft 50 Mg Tabs (Sertraline Hcl) .... Take 1 By Mouth Once Daily 13)  Metformin Hcl 500 Mg Tabs (Metformin Hcl) .... Take 1 By Mouth Two Times A Day 14)  Tessalon 200 Mg Caps (Benzonatate) .... Take 1 By Mouth Three Times A Day As Needed  Allergies (verified): 1)  ! 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 HYPERLIPIDEMIA-MIXED (ICD-272.4) ESSENTIAL HYPERTENSION (ICD-401.9) * VOCAL CORD DYSFUNCTION DIABETES MELLITUS (ICD-250.00) ESOPHAGEAL REFLUX (ICD-530.81) TREMOR, ESSENTIAL (ICD-333.1) ANXIETY (ICD-300.00) ALLERGIC RHINITIS (ICD-477.9) ASTHMA (ICD-493.90)  Past Surgical History: Last updated: 06/22/2008 Tonsils R Breast cyst L ankle ligament repair Left elbow repair after fall Partial hysterectomy Cholescystectomy C-section Ablation for AVNRT  Family History: Last updated: 2008-02-02 Father  died age 65 from lung  cancer Mother died  age 18 from COPD, MRSA pneumonia 1 brother alive age 39  hx of copd Sister died 62yo- smoker, ?heart  Social History: Last updated: 12/17/2008 married 1 child living 1child died brain tumor at age 70 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    Vital Signs:  Patient profile:   60 year old female Height:      64 inches Weight:      208.13 pounds BMI:     35.85 O2 Sat:      97 % on Room air Pulse rate:   94 / minute BP sitting:   118 / 72  (left arm) Cuff size:   regular  Vitals Entered By: Reynaldo Minium CMA (June 17, 2009 9:10 AM)  O2 Flow:  Room air  Physical Exam  Additional Exam:  General: A/Ox3; pleasant and cooperative, NAD, better mood, cheerful, overweight  SKIN: no rash, lesions NODES: no lymphadenopathy HEENT: [place]/AT, EOM- WNL, Conjuctivae- clear, PERRLA, TM-WNL, Nose- clear, wet, Throat- clear and wnl,  tremulous vocal pattern NECK: Supple w/ fair ROM, JVD- none, normal carotid impulses w/o bruits Thyroid-  CHEST: Clear to P&A, no wheeze, HEART: rhythm is now regular, no m/g/r heard ABDOMEN: ZOX:WRUE, nl pulses, no edema  NEURO: Grossly intact to observation      Impression & Recommendations:  Problem # 1:  ASTHMA (ICD-493.90) Fair control with viral pattern episodic flares this winter now resolved. We discussed access to meds.  Problem # 2:  ALLERGIC RHINITIS (ICD-477.9)  Seasonal exacerbation of rhinitis. We discussed available otc nonsedating antinhistamines. Her updated medication list for this problem includes:    Benadryl 25 Mg Tabs (Diphenhydramine hcl) .Marland Kitchen... 1 tabs at bedtime as needed  Medications Added to Medication List This Visit: 1)  Zoloft 50 Mg Tabs (Sertraline hcl) .... Take 1 by mouth once  daily 2)  Metformin Hcl 500 Mg Tabs (Metformin hcl) .... Take 1 by mouth two times a day 3)  Tessalon 200 Mg Caps (Benzonatate) .... Take 1 by mouth three times a day as needed 4)  Benzonatate 100 Mg Caps (Benzonatate) .Marland Kitchen.. 1-2 four times a day as needed cough 5)  Otic Edge 5.4-1.4-0.0097 % Soln (Antipyrine-benzocaine-polycos) .Marland Kitchen.. 1-2 drops in ear three times a day as needed pain  Other Orders: Est. Patient Level II (45409)  Patient Instructions: 1)  Please schedule a follow-up appointment in 6 months. 2)  Consider nonsedating otc antihistamines as alternatives to benadryl 3)    either claritin / loratadine or allegra/ fexofenadine 4)  Consider otc decongestant Sudafed-PE if needed for earache 5)  Script for eardrops for earache if needed. Prescriptions: OTIC EDGE 5.4-1.4-0.0097 % SOLN (ANTIPYRINE-BENZOCAINE-POLYCOS) 1-2 drops in ear three times a day as needed pain  #1 vial x prn   Entered and Authorized by:   Waymon Budge MD   Signed by:   Waymon Budge MD on 06/17/2009   Method used:   Print then Give to Patient   RxID:   [id number]

## 2010-05-06 NOTE — Assessment & Plan Note (Signed)
Summary: 3 mo fu per march reminsder-srs   Visit Type:  Follow-up Primary Provider:  [name] [facility])  CC:  palpitations.  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 The patient also has a history of depression. She reports no chest pain. She does have some tightness when she coughs. She reports some sinus problems. After starting Zoloft she had significant increase in appetite. Has occasional palpitations typically 2 times a week, just lasting a few minutes without any symptoms of chest pain short of breath or presyncope. From cardiac perspective is otherwise stable. Shows significant improvement in her mood disorder.  Preventive Screening-Counseling & Management  Alcohol-Tobacco     Smoking Status: quit  Comments: quit smoking 20+ ys, smoked off/on for about 10-15 yrs   Current Medications (verified): 1)  Advair Diskus 250-50 Mcg/dose  Misc (Fluticasone-Salmeterol) .... Inhale 1 Puff Two Times A Day 2)  Proair Hfa 108 (90 Base) Mcg/act Aers (Albuterol Sulfate) .... 2 Puffs Four Times A Day As Needed 3)  Benadryl 25 Mg  Tabs (Diphenhydramine Hcl) .Marland Kitchen.. 1 Tabs At Bedtime As Needed 4)  Duoneb 2.5-0.5 Mg/60ml  Soln (Albuterol-Ipratropium) .... Four Times A Day As Needed 5)  Bayer Aspirin 325 Mg Tabs (Aspirin) .... Take 1 Tablet By Mouth Once A Day 6)  Metoprolol Tartrate 50 Mg Tabs (Metoprolol Tartrate) .... Take One Tablet By Mouth Twice A Day 7)  Zantac 150mg  .... 1 By Mouth Two Times A Day As Needed 8)  Tylenol Pm Extra Strength 500-25 Mg Tabs (Diphenhydramine-Apap (Sleep)) .... Per Bottle 9)  Humalog 100 Unit/ml Soln (Insulin Lispro (Human)) .... Use As Directed 10)  Lantus 100 Unit/ml Soln (Insulin Glargine) .... Use As Directed 11)  Benicar Hct 40-25 Mg Tabs (Olmesartan Medoxomil-Hctz) .... Take 1 Tablet By Mouth Once A Day 12)  Zoloft 50 Mg Tabs (Sertraline Hcl) ....  Take 1 By Mouth Once Daily 13)  Metformin Hcl 500 Mg Tabs (Metformin Hcl) .... Take 1 By Mouth Two Times A Day 14)  Tessalon 200 Mg Caps (Benzonatate) .... Take 1 By Mouth Three Times A Day As Needed 15)  Benzonatate 100 Mg Caps (Benzonatate) .Marland Kitchen.. 1-2 Four Times A Day As Needed Cough 16)  Otic Edge 5.4-1.4-0.0097 % Soln (Antipyrine-Benzocaine-Polycos) .Marland Kitchen.. 1-2 Drops in Ear Three Times A Day As Needed Pain 17)  Coricidin Hbp Cough/cold 4-30 Mg Tabs (Chlorpheniramine-Dm) .... As Needed Cough  Allergies: 1)  ! Tylox  Comments:  Nurse/Medical Assistant: The patient's medications were reviewed with the patient and were updated in the Medication List. Pt brought a list of medications to office visit.  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) ANXIETY (ICD-300.00) ALLERGIC RHINITIS (ICD-477.9) ASTHMA (ICD-493.90)  Past Surgical History: Last updated: 06/22/2008 Tonsils R Breast cyst L ankle ligament repair Left elbow repair after fall Partial hysterectomy Cholescystectomy C-section Ablation for AVNRT     Family History: Last updated: 02/25/2008 Father  died age 62 from lung  cancer Mother died  age 36 from COPD, MRSA pneumonia 1 brother alive age 52  hx of copd Sister died 32yo- smoker, ?heart  Social History: Last updated: 12/17/2008 married 1 child living 1child died brain tumor at age 57 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  The  patient denies fatigue, malaise, fever, weight gain/loss, vision loss, decreased hearing, hoarseness, chest pain, shortness of breath, prolonged cough, wheezing, sleep apnea, coughing up blood, abdominal pain, blood in stool, nausea, vomiting, diarrhea, heartburn, incontinence, blood in urine, muscle weakness, joint pain, leg swelling, rash, skin lesions, headache, fainting, dizziness, depression, anxiety, enlarged lymph nodes, easy bruising or bleeding, and environmental allergies.    Vital Signs:  Patient profile:   60 year old female Height:      64 inches Weight:      207.50 pounds BMI:     35.75 Pulse rate:   82 / minute BP sitting:   114 / 77  (left arm) Cuff size:   large  Vitals Entered By: Cyril Loosen, RN, BSN (June 24, 2009 8:53 AM)  Nutrition Counseling: Patient's BMI is greater than 25 and therefore counseled on weight management options. CC: palpitations Comments Pt states she's having some fast heartbeats about twice per week. She states this occurs when she walks a little bit and then sits down.    Physical Exam  Additional Exam:  General: Well-developed, well-nourished in no distress head: Normocephalic and atraumatic eyes PERRLA/EOMI intact, conjunctiva and lids normal nose: No deformity or lesions mouth normal dentition, normal posterior pharynx neck: Supple, no JVD.  No masses, thyromegaly or abnormal cervical nodes lungs: Normal breath sounds bilaterally without wheezing.  Normal percussion heart: regular rate and rhythm with normal S1 and S2, no S3 or S4.  PMI is normal.  No pathological murmurs abdomen: Normal bowel sounds, abdomen is soft and nontender without masses, organomegaly or hernias noted.  No hepatosplenomegaly musculoskeletal: Back normal, normal gait muscle strength and tone normal pulsus: Pulse is normal in all 4 extremities Extremities: No peripheral pitting edema neurologic: Alert and oriented x 3 skin: Intact without lesions or  rashes cervical nodes: No significant adenopathy psychologic: Normal affect    Impression & Recommendations:  Problem # 1:  DEPRESSION (ICD-311) Assessment Improved  Problem # 2:  PALPITATIONS (ICD-785.1) the patient has rare palpitations. No definite clinical evidence that she has recurrent arrhythmias. No further workup is required for the time being Her updated medication list for this problem includes:    Bayer Aspirin 325 Mg Tabs (Aspirin) .Marland Kitchen... Take 1 tablet by mouth once a day    Metoprolol Tartrate 50 Mg Tabs (Metoprolol tartrate) .Marland Kitchen... Take one tablet by mouth twice a day  Problem # 3:  ESSENTIAL HYPERTENSION (ICD-401.9) Assessment: Improved  Her updated medication list for this problem includes:    Bayer Aspirin 325 Mg Tabs (Aspirin) .Marland Kitchen... Take 1 tablet by mouth once a day    Metoprolol Tartrate 50 Mg Tabs (Metoprolol tartrate) .Marland Kitchen... Take one tablet by mouth twice a day    Benicar Hct 40-25 Mg Tabs (Olmesartan medoxomil-hctz) .Marland Kitchen... 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 Patient status post ablation. Her updated medication list for this problem includes:    Bayer Aspirin 325 Mg Tabs (Aspirin) .Marland Kitchen... Take 1 tablet by mouth once a day    Metoprolol Tartrate 50 Mg Tabs (Metoprolol tartrate) .Marland Kitchen... Take one tablet by mouth twice a day  Patient Instructions:  1)  Your physician recommends that you continue on your current medications as directed. Please refer to the Current Medication list given to you today. 2)  Follow up in  1 year.

## 2010-06-15 NOTE — Progress Notes (Unsigned)
  Subjective:    Patient ID: [name], female    DOB: 05-10-1950, 60 y.o.   MRN: [id number]  HPI    Review of Systems     Objective:   Physical Exam        Assessment & Plan:

## 2010-06-16 ENCOUNTER — Ambulatory Visit (INDEPENDENT_AMBULATORY_CARE_PROVIDER_SITE_OTHER): Payer: Self-pay | Admitting: Internal Medicine

## 2010-06-16 ENCOUNTER — Encounter: Payer: Self-pay | Admitting: Internal Medicine

## 2010-06-16 DIAGNOSIS — J309 Allergic rhinitis, unspecified: Secondary | ICD-10-CM

## 2010-06-16 DIAGNOSIS — F411 Generalized anxiety disorder: Secondary | ICD-10-CM

## 2010-06-16 DIAGNOSIS — J45909 Unspecified asthma, uncomplicated: Secondary | ICD-10-CM

## 2010-06-16 DIAGNOSIS — B370 Candidal stomatitis: Secondary | ICD-10-CM

## 2010-06-16 DIAGNOSIS — F458 Other somatoform disorders: Secondary | ICD-10-CM

## 2010-06-17 DIAGNOSIS — F458 Other somatoform disorders: Secondary | ICD-10-CM | POA: Insufficient documentation

## 2010-06-20 ENCOUNTER — Encounter: Payer: Self-pay | Admitting: *Deleted

## 2010-06-24 NOTE — Assessment & Plan Note (Signed)
Summary: 6 month rov   Primary Provider/Referring Provider:  [name] (Health Dept)  CC:  6 month follow up visit-asthma and allergies; still losing voice at times..  History of Present Illness: June 17, 2009- Asthma, allergic rhinitis, VCD/ Hx SVT/ablation Since last here had CXR 01/06/09- WNL, NAD. Has had f/u with cardiology for rhythm. Notes occasional palpitation. She denies dyspnea with this and she has learned to relax til it resolves. Continues metoprolol. Went to ER twice this winter with "bad bouts of bronchitis" treated with Z pak. She needed her nebulizer then, otherwise averaging about once every 2 months. Aware of more postnasal drip now in pollen season. Uses benadryl up to three times a day which can make her drowsy enough not to drive.  Not working so has no insurance. Gets some meds through health department.  December 16, 2009- Asthma, allergic rhinitis, VCD, Hx SVT/ ablation, DM One ER visit for asthma at [facility] one month ago - got solumedrol and neb, no prednisone.. 2 weeks ago had EGD- no respiratory problems.  Stress was probable trigger for ER visit. WTW on flu vax.  Some reflux. Chest stays clear unless she gets bad enough she goes to ER. Uses neb or rescue inhaler less than once/ week. Continues Advair two times a day.  June 16, 2010- Asthma, allergic rhinitis, VCD, Hx SVT/ ablation, DM Nurse-CC: 6 month follow up visit-asthma and allergies; still losing voice at times. Mid December had a cold. Resolved after bronchitis/ asthma flares off and on through the winter. [facility]/ Bronchitis in Feb. Treated with steroids, Z pak, nebs. Left now with residual laryngitis. Prilosec is controlling heartburn. She has to take time off from church choir. Coughing some clear phlegm, not tight or wheezing. Sinus drip, no headache. Finished prednisone in February. No palpitations..Not needing proventil rescue or her neb at all in last 2 weeks, continuing advair  250.    Asthma History    Asthma Control Assessment:    Age range: 12+ years    Symptoms: 0-2 days/week    Nighttime Awakenings: 0-2/month    Interferes w/ normal activity: no limitations    SABA use (not for EIB): 0-2 days/week    Asthma Control Assessment: Well Controlled   Preventive Screening-Counseling & Management  Alcohol-Tobacco     Smoking Status: quit     Year Quit: 1987  Current Medications (verified): 1)  Advair Diskus 250-50 Mcg/dose  Misc (Fluticasone-Salmeterol) .... Inhale 1 Puff Two Times A Day 2)  Proventil Hfa 108 (90 Base) Mcg/act Aers (Albuterol Sulfate) .... 2 Puffs Four Times A Day As Needed 3)  Duoneb 2.5-0.5 Mg/13ml  Soln (Albuterol-Ipratropium) .... Four Times A Day As Needed 4)  Bayer Aspirin 325 Mg Tabs (Aspirin) .... Take 1 Tablet By Mouth Once A Day 5)  Metoprolol Tartrate 50 Mg Tabs (Metoprolol Tartrate) .... Take One Tablet By Mouth Twice A Day 6)  Humalog 100 Unit/ml Soln (Insulin Lispro (Human)) .... Use As Directed 7)  Lantus 100 Unit/ml Soln (Insulin Glargine) .... Use As Directed 8)  Benicar Hct 40-25 Mg Tabs (Olmesartan Medoxomil-Hctz) .... Take 1 Tablet By Mouth Once A Day 9)  Zoloft 100 Mg Tabs (Sertraline Hcl) .... Take 1 By Mouth Once Daily 10)  Metformin Hcl 500 Mg Tabs (Metformin Hcl) .... Take 1 By Mouth Two Times A Day 11)  Alprazolam 0.5 Mg Tabs (Alprazolam) .Marland Kitchen.. 1 Three Times A Day As Needed Nerves 12)  Flexeril 10 Mg Tabs (Cyclobenzaprine Hcl) .... Take  1 By Mouth Three Times A Day 13)  Levothyroxine Sodium 25 Mcg Tabs (Levothyroxine Sodium) .... Take 1 By Mouth Once Daily 14)  Fish Oil 1000 Mg Caps (Omega-3 Fatty Acids) .... Take 1 By Mouth Two Times A Day 15)  Nyquil 60-7.09-02-998 Mg/81ml Liqd (Pseudoeph-Doxylamine-Dm-Apap) .... As Needed 16)  Ibuprofen 800 Mg Tabs (Ibuprofen) .... Take As Needed  Allergies: 1)  ! Tylox 2)  ! 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 HYPERLIPIDEMIA-MIXED (ICD-272.4) ESSENTIAL HYPERTENSION (ICD-401.9) * VOCAL CORD DYSFUNCTION DIABETES MELLITUS (ICD-250.00) ESOPHAGEAL REFLUX (ICD-530.81) TREMOR, ESSENTIAL (ICD-333.1) ANXIETY (ICD-300.00) ALLERGIC RHINITIS (ICD-477.9) ASTHMA (ICD-493.90)  Past Surgical History: Last updated: 06/22/2008 Tonsils R Breast cyst L ankle ligament repair Left elbow repair after fall Partial hysterectomy Cholescystectomy C-section Ablation for AVNRT     Family History: Last updated: 2008-02-22 Father  died age 45 from lung  cancer Mother died  age 70 from COPD, MRSA pneumonia 1 brother alive age 58  hx of copd Sister died 19yo- smoker, ?heart  Social History: Last updated: 12/17/2008 married 1 child living 1child died brain tumor at age 17 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  The patient denies shortness of breath at rest, non-productive cough, coughing up blood, chest pain, irregular heartbeats, acid heartburn, indigestion, loss of appetite, abdominal pain, difficulty swallowing, sore throat, tooth/dental problems, headaches, nasal congestion/difficulty breathing through nose, and sneezing.         Weight gain on swteroids. Clear mucus. Hoarse  Vital Signs:  Patient profile:   60 year old female Height:      64 inches Weight:      217 pounds BMI:     37.38 O2 Sat:      99 % on Room air Pulse rate:   112 / minute BP sitting:   132 / 70  (right arm) Cuff size:   large  Vitals Entered By: Reynaldo Minium CMA (June 16, 2010 9:02 AM)  O2 Flow:  Room air CC: 6 month follow up visit-asthma and allergies; still losing voice at  times.   Physical Exam  Additional Exam:  General: A/Ox3; pleasant and cooperative, NAD, better mood, cheerful, overweight - increase since last visit SKIN: no rash, lesions NODES: no lymphadenopathy HEENT: [place]/AT, EOM- WNL, Conjuctivae- clear, PERRLA, TM-WNL, Nose- clear, wet, Throat- mild yeast,  tremulous vocal pattern NECK: Supple w/ fair ROM, JVD- none, normal carotid impulses w/o bruits Thyroid- . 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 Hoarse now- will treat as mild thrush with diflucan tabs. Consider Advair powder hoarseness.  Orders: Est. Patient Level III (78469)  Problem # 2:  ASTHMA (ICD-493.90) Recurrent viral infections this winter, now resolving. Current meds are ok. Consider if best dx is more bronchitis than asthma. There has been a  significant anxiety overlay.  Problem # 3:  ALLERGIC RHINITIS (ICD-477.9)  Seasonal rhinitis with increased drip may be starting now. Discussed use of antihistamines.  The following medications were removed from the medication list:    Benadryl 25 Mg Tabs (Diphenhydramine hcl) .Marland Kitchen... 1 tabs at bedtime as needed  Problem # 4:  ANXIETY (ICD-300.00)  In fair balance today.  Her updated medication list for this problem includes:    Zoloft 100 Mg Tabs (Sertraline hcl) .Marland Kitchen... Take 1 by mouth once daily    Alprazolam 0.5 Mg Tabs (Alprazolam) .Marland Kitchen... 1 three times a day as needed nerves  Problem # 5:  THRUSH (ICD-112.0)  Discussed mouth care. Give diflucan.   Orders: Est. Patient Level III (47829)  Medications Added to Medication List This Visit: 1)  Flexeril 10 Mg Tabs (Cyclobenzaprine hcl) .... Take 1  by mouth three times a day 2)  Levothyroxine Sodium 25 Mcg Tabs (Levothyroxine sodium) .... Take 1 by mouth once daily 3)  Fish Oil 1000 Mg Caps (Omega-3 fatty acids) .... Take 1 by mouth two times a day 4)  Nyquil 60-7.09-02-998 Mg/55ml Liqd (Pseudoeph-doxylamine-dm-apap) .... As needed 5)  Ibuprofen 800 Mg Tabs (Ibuprofen) .... Take as needed 6)  Fluconazole 150 Mg Tabs (Fluconazole) .Marland Kitchen.. 1 daily x 3 days for thrush  Patient Instructions: 1)  Please schedule a follow-up appointment in 6 months. 2)  Try using Advair before breakfast and supper, to help minimize yeast/ thrush issues.  3)  Continue present meds. Allegra/ fexofenadine is fine as an antihistamine. 4)  Script for diflucan pills for thrush.  Prescriptions: FLUCONAZOLE 150 MG TABS (FLUCONAZOLE) 1 daily x 3 days for thrush  #3 x o   Entered and Authorized by:   Waymon Budge MD   Signed by:   Waymon Budge MD on 06/16/2010   Method used:   Print then Give to Patient   RxID:   [id number]

## 2010-07-02 ENCOUNTER — Encounter: Payer: Self-pay | Admitting: Cardiology

## 2010-07-02 ENCOUNTER — Telehealth: Payer: Self-pay | Admitting: Internal Medicine

## 2010-07-02 ENCOUNTER — Ambulatory Visit (INDEPENDENT_AMBULATORY_CARE_PROVIDER_SITE_OTHER): Payer: Self-pay | Admitting: Cardiology

## 2010-07-02 DIAGNOSIS — E785 Hyperlipidemia, unspecified: Secondary | ICD-10-CM

## 2010-07-02 DIAGNOSIS — I498 Other specified cardiac arrhythmias: Secondary | ICD-10-CM

## 2010-07-02 DIAGNOSIS — J45909 Unspecified asthma, uncomplicated: Secondary | ICD-10-CM

## 2010-07-02 MED ORDER — PREDNISONE 10 MG PO TABS: ORAL_TABLET | ORAL | Status: DC

## 2010-07-02 NOTE — Assessment & Plan Note (Signed)
The patient has significant wheezing.  However she is on treatment with steroids and doxycycline.  I told the patient however that if she has worsening shortness of breath that she should be seen as soon as possible by Dr. Maple Hudson or go to the emergency room.

## 2010-07-02 NOTE — Assessment & Plan Note (Signed)
No recurrent arrhythmias.Continue medical therapy

## 2010-07-02 NOTE — Telephone Encounter (Signed)
Spoke with pt and she c/o increased SOB, wheezing, lots of chest congestion, bad cough w/ yellow phlem, low grade fever on and off running 99.5. Pt states she has been in the hospital 3 times since Feb and was recently d/c'd Sunday from [facility] and she was dx with bronchitis and asthma. Pt has been taking her advair 1 puff twice a day, using her proair 2-3 times a day, albuterol neb 4 times a day. Pt requesting recs from Dr. Maple Hudson on what to do. Pt states she feels like she is just getting worse. Please advise thanks  Allergies  Allergen Reactions  . Guaifenesin     REACTION: asthma attacks  . Oxycodone-Acetaminophen   . Mucinex    Carver Fila, CMA

## 2010-07-02 NOTE — Telephone Encounter (Signed)
Called and advised pt of cdy recs. Pt verbalized understanding and had no furthur questions. Advised pt if she gets worse then to give [place] a call back. She verbalized understanding  Carver Fila, CMA

## 2010-07-02 NOTE — Assessment & Plan Note (Signed)
The patient will need a lipid panel.  This will be ordered.

## 2010-07-02 NOTE — Patient Instructions (Signed)
The current medical regimen is effective;  continue present plan and medications.  Follow up in 6 months

## 2010-07-02 NOTE — Progress Notes (Signed)
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 The patient also has a history of depression. She has a long-standing history of tobacco use. The patient also has a history of hypertension. The patient was last seen a year ago and was doing well. The patientDoes report some increased shortness of breath over the last several weeks.  She is are several hospitalizations for bronchitis and asthma.  She has some active wheezing today.  She has contacted Dr. Maple Hudson and has been prescribed prednisone and doxycycline. From a cardiac standpoint however she is doing well.  She denies any chest pain or palpitations.  Allergies  Allergen Reactions  . Guaifenesin     REACTION: asthma attacks  . Oxycodone-Acetaminophen   . Mucinex     Current Outpatient Prescriptions on File Prior to Visit  Medication Sig Dispense Refill  . Antipyrine-Benzocaine-Polycos (OTIC EDGE) 5.4-1.4-0.0097 % SOLN 1-2 drops to ear three times daily as needed       . aspirin 325 MG tablet Take 325 mg by mouth daily.        . Fluticasone-Salmeterol (ADVAIR DISKUS) 250-50 MCG/DOSE AEPB Inhale 1 puff into the lungs every 12 (twelve) hours.        . insulin glargine (LANTUS) 100 UNIT/ML injection Use as directed        . insulin lispro (HUMALOG) 100 UNIT/ML injection Use as directed        . olmesartan-hydrochlorothiazide (BENICAR HCT) 40-25 MG per tablet Take 1 tablet by mouth daily.        . sertraline (ZOLOFT) 100 MG tablet Take 100 mg by mouth daily.        Marland Kitchen DISCONTD: ALPRAZolam (XANAX) 0.5 MG tablet Take one by mouth three times daily        . DISCONTD: benzonatate (TESSALON) 200 MG capsule Take 200 mg by mouth 3 (three) times daily as needed.        Marland Kitchen DISCONTD: Chlorpheniramine-DM (CORICIDIN COUGH/COLD) 4-30 MG TABS As needed        . DISCONTD: diphenhydrAMINE (BENADRYL) 25 MG tablet Take one by mouth daily at bedtime as needed        . DISCONTD:  diphenhydramine-acetaminophen (TYLENOL PM EXTRA STRENGTH) 25-500 MG TABS Take 1 tablet by mouth at bedtime as needed.        Marland Kitchen DISCONTD: ipratropium-albuterol (DUONEB) 0.5-2.5 (3) MG/3ML SOLN Take 3 mLs by nebulization.        Marland Kitchen DISCONTD: metFORMIN (GLUCOPHAGE) 500 MG tablet Take 500 mg by mouth 2 (two) times daily with a meal.        . DISCONTD: metoprolol (LOPRESSOR) 50 MG tablet Take 50 mg by mouth 2 (two) times daily.        Marland Kitchen DISCONTD: promethazine-codeine (PHENERGAN WITH CODEINE) 6.25-10 MG/5ML syrup 1 teaspoon four times daily as needed cough       . DISCONTD: ranitidine (ZANTAC) 150 MG capsule Take 150 mg by mouth 2 (two) times daily.          Past Medical History  Diagnosis Date  . Hyperlipidemia   . Hypertension   . Diabetes mellitus   . Asthma   . AV nodal re-entry tachycardia     s/p slow pathway ablation  . Vocal cord dysfunction   . Esophageal reflux   . Tremor, essential   . Anxiety   . Allergic rhinitis     Past Surgical History  Procedure Date  . Tonsillectomy   .  Right breast cyst   . Left ankle ligament repair   . Left elbow repair   . Partial hysterectomy   . Cholecystectomy   . Cesarean section   . Ablation for avnrt     Family History  Problem Relation Age of Onset  . Lung cancer Father     DIED AGE 35 LUNG CA  . COPD Mother     DIED AGE 71,MRSA,COPD,PNEUMONIA  . Pneumonia Mother     DIED AGE 71,MRSA,COPD,PNEUMONIA  . COPD Brother     AGE 41  . Heart disease Sister     DIED AGE 39, SMOKER,?HEART    History   Social History  . Marital Status: Married    Spouse Name: N/A    Number of Children: N/A  . Years of Education: N/A   Occupational History  . Not on file.   Social History Main Topics  . Smoking status: Former Games developer  . Smokeless tobacco: Former Neurosurgeon    Quit date: 04/06/1985  . Alcohol Use: No     NO LONGER  . Drug Use: No  . Sexually Active: Not on file   Other Topics Concern  . 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 The remainder of the 18  point review of systems is negative   PHYSICAL EXAM BP 104/71  Pulse 109  Ht 5\' 4"  (1.626 m)  Wt 216 lb (97.977 kg)  BMI 37.08 kg/m2 General: Well-developed, well-nourished in no distress Head: Normocephalic and atraumatic Eyes:PERRLA/EOMI intact, conjunctiva and lids normal Ears: No deformity or lesions Mouth:normal dentition, normal posterior pharynx Neck: Supple, no JVD.  No masses, thyromegaly or abnormal cervical nodes Lungs: wheezing bilaterally with prolonged experience. Cardiac: regular rate and rhythm with normal S1 and S2, no S3 or S4.  PMI is normal.  No pathological murmurs Abdomen: Normal bowel sounds, abdomen is soft and nontender without masses, organomegaly or hernias noted.  No hepatosplenomegaly MSK: Back normal, normal gait muscle strength and tone normal Vascular: Pulse is normal in all 4 extremities Extremities: No peripheral pitting edema Neurologic: Alert and oriented x 3 Skin: Intact without lesions or rashes Lymphatics: No significant adenopathy Psychologic: Normal affect   ECG:EKG performed one week ago demonstrates normal sinus rhythm with no acute ischemic changes.  ASSESSMENT AND PLAN

## 2010-07-02 NOTE — Telephone Encounter (Signed)
Per CDY-suggest Prednisone 10mg  #25 take 1 by mouth daily to stabilize airway no refills.

## 2010-07-17 LAB — CBC
HCT: 42.4 % (ref 36.0–46.0)
Hemoglobin: 14.6 g/dL (ref 12.0–15.0)
MCHC: 34.4 g/dL (ref 30.0–36.0)
MCV: 91 fL (ref 78.0–100.0)
Platelets: 227 10*3/uL (ref 150–400)
RBC: 4.66 MIL/uL (ref 3.87–5.11)
RDW: 13 % (ref 11.5–15.5)
WBC: 7.8 10*3/uL (ref 4.0–10.5)

## 2010-07-17 LAB — DIFFERENTIAL
Basophils Absolute: 0 10*3/uL (ref 0.0–0.1)
Basophils Relative: 0 % (ref 0–1)
Eosinophils Absolute: 0 10*3/uL (ref 0.0–0.7)
Eosinophils Relative: 0 % (ref 0–5)
Lymphocytes Relative: 27 % (ref 12–46)
Lymphs Abs: 2.1 10*3/uL (ref 0.7–4.0)
Monocytes Absolute: 0.3 10*3/uL (ref 0.1–1.0)
Monocytes Relative: 3 % (ref 3–12)
Neutro Abs: 5.5 10*3/uL (ref 1.7–7.7)
Neutrophils Relative %: 70 % (ref 43–77)

## 2010-07-17 LAB — BASIC METABOLIC PANEL
BUN: 7 mg/dL (ref 6–23)
CO2: 23 mEq/L (ref 19–32)
Calcium: 9.6 mg/dL (ref 8.4–10.5)
Chloride: 102 mEq/L (ref 96–112)
Creatinine, Ser: 0.92 mg/dL (ref 0.4–1.2)
GFR calc Af Amer: >60 mL/min (ref >60)
GFR calc non Af Amer: >60 mL/min (ref >60)
Glucose, Bld: 445 mg/dL — ABNORMAL HIGH (ref 70–99)
Potassium: 4.4 mEq/L (ref 3.5–5.1)
Sodium: 140 mEq/L (ref 135–145)

## 2010-08-19 NOTE — Assessment & Plan Note (Signed)
[facility]                          [facility] OFFICE NOTE   [name], [name]                     MRN:          [id number]  DATE:08/24/2008                            DOB:          November 27, 1950    REFERRING PHYSICIAN:  Delaney Meigs, M.D.   HISTORY OF PRESENT ILLNESS:  The patient is a 60 year old female with a  history of AV nodal ablation last year in 2009.  She saw Dr. Johney Frame  recently for a followup because of 3 episodes of heart racing.  She  underwent a CardioNet monitor.  We do not have the official report, but  reviewing centricity, it appears that the nurse called with normal  readings on a CardioNet monitor.  The patient states that she has had no  further problems.  The patient was continued on metoprolol by Dr.  Johney Frame.  In the interim, she also has required hospitalization for an  asthma exacerbation.  However, the patient was noncompliant with her  medical regimen and was not taking her Advair on a daily basis.  Today,  she is not wheezing.  She has no chest pain or shortness of breath.  She  denies any orthopnea or PND.   MEDICATIONS:  The patient is an extensive medication list which  includes;  1. Metformin 1000 mg p.o. b.i.d.  2. Metoprolol 50 p.o. b.i.d.  3. Glipizide ER 10 mg p.o. daily.  4. Januvia 100 mg p.o. daily.  5. Norvasc 10 mg p.o. daily.  6. Mysoline 50 mg p.o. t.i.d.  7. Aspirin 325 daily.  8. Topamax 150 mg p.o. daily.   PHYSICAL EXAMINATION:  VITAL SIGNS:  Blood pressure is 115/97, heart  rate is 97, and weight is 175 pounds.  HEENT:  Pupils are isocoric.  Conjunctivae are clear.  NECK:  Supple.  Normal carotid upstroke and no carotid bruits.  LUNGS:  Clear breath sounds bilaterally.  HEART:  Regular rate and rhythm.  Normal S1 and S2.  No murmur, rubs, or  gallops. The patient has an S4.  ABDOMEN:  Soft, nontender.  No rebound or guarding.  Good bowel sounds.  EXTREMITIES:  No cyanosis,  clubbing, or edema.  NEURO:  The patient is alert, oriented, and grossly nonfocal.   PROBLEM LIST:  1. Tachy palpitations.  2. History of atrioventricular nodal reentrant tachycardia ablation.      February 27, 2008.  3. Normal left ventricular systolic function.  4. Hypertension versus white coat hypertension.   PLAN:  1. We will obtain the echocardiogram as the patient has an S4 on exam      which is likely secondary to hypertension.  Her blood pressure was      hypertensive at the last time in Dr. Jenel Lucks office also.  We will      have the patient to come back for an RN visit to recheck her blood      pressure.  I do anticipate that she will need an additional      diuretic for blood pressure lowering.  2. The patient under prior ischemia workup which was negative.  3. The patient has had asthma exacerbation and maybe beta-blocker may      not be the optimal choice for her.  She is on Norvasc and we      potentially could switch this to Cardizem or verapamil and use this      both for her arrhythmia as well as for hypertension.  We will make      the decision during the next clinic visit.     Learta Codding, MD,FACC  Electronically Signed    GED/MedQ  DD: 08/24/2008  DT: 08/24/2008  Job #: [id number]   cc:   Delaney Meigs, M.D.

## 2010-08-19 NOTE — Discharge Summary (Signed)
[name], [name]              ACCOUNT NO.:  [id number]   MEDICAL RECORD NO.:  [id number]          PATIENT TYPE:  INP   LOCATION:  3707                         FACILITY:  [facility]   PHYSICIAN:  Hillis Range, MD       DATE OF BIRTH:  May 01, 1950   DATE OF ADMISSION:  02/26/2008  DATE OF DISCHARGE:  02/28/2008                               DISCHARGE SUMMARY   FINAL DIAGNOSES:  1. Discharging day #1 status post electrophysiology study,      radiofrequency catheter ablation of inducible atrioventricular      nodal reentry tachycardia.      a.     Dual atrioventricular nodal physiology.      b.     Successful slow pathway ablation.      c.     No inducible arrhythmia after ablation.  2. Admitted and transferred from [facility] with symptomatic      tachy-palpitation.      a.     Presyncope, heart racing, chest pain, and diaphoresis.      b.     Adenosine cardioversion in the emergency room.   SECONDARY DIAGNOSES:  1. Diabetes.  2. Hypertension.  3. Chronic asthma.  4. Chronic benign tremor.  5. Caffeine ingestion.  6. Elevated TSH this admission of 6.433.  Troponin I studies were      negative 0.03 then 0.04.   PROCEDURE:  On February 27, 2008, electrophysiology study with finding  of classic atrioventricular nodal reentry tachycardia, successful slow  pathway ablation with no inducible arrhythmia, Dr. Hillis Range.   The patient goes home with aspirin and her other medications, to follow  up with Dr. Johney [name] in 4 weeks.   BRIEF HISTORY:  [name] is a 60 year old female.  She has had  intermittent palpitations for the last 10 years.  They have never been  captured; however, at work on Saturday, February 25, 2008, she had  tachy-palpitations, which did not terminate, in addition they caused  chest pain, diaphoresis, and presyncope.  She notes that she has noted  these palpitations becoming more frequent in the last 2 months.  They  usually last about 15 minutes, but now  they have lasted longer.  On  Saturday, February 25, 2008, they did not terminate even after 1 hour  and she called for assistance.  She actually required adenosine  cardioversion in the emergency room.  The patient has had no recurrence  of her tachy-palpitation while here at [facility], but they  were inducible at EP study and they were ablatable.  The patient  discharging postprocedure day #1.   DISCHARGE MEDICATIONS:  1. Amlodipine 5 mg daily.  2. Avapro 150 mg daily.  3. Enteric-coated aspirin 325 mg daily, a new medication.  4. Topiramate 100 mg tablets 1 to 1-1/2 half tablets daily.  5. Amitriptyline 25 mg tablets 2 tablets at bedtime.  6. Primidone 250 mg 1 tablet daily at bedtime.  7. Simvastatin.  The patient is unsure whether she takes 10 or 20 mg.      We are telling her to take  it as she has been doing and at bedtime      daily.  8. Glipizide XL 10 mg daily.  9. Metformin hydrochloride 500 mg tablets 2 tablets in the morning, 2      tablets in the evening.  10.Lorazepam 1 mg twice daily as needed.  11.Januvia 100 mg daily.  12.Advair 250/50 one puff twice daily.  13.Multivitamin daily.  14.Calcium with vitamin D 600 mg daily.  15.To stop metoprolol.  16.Darvocet-N 100 1-2 tablets every 4-6 hours as needed for pain.  17.Vicodin 7.5/500 one to two tablets every 4-6 hours as needed for      pain.  The patient is given excuse to return to work on March 05, 2008, with no restrictions except for no heavy lifting for 2      weeks.   She follows up at [facility] [address]  [place] Dr. Johney [name] on Tuesday, March 27, 2008, at 9:45.   LABORATORY STUDIES THIS ADMISSION:  Hemoglobin 14, hematocrit 41.4,  white cells 8.5, platelets of 259.  Serum electrolytes, sodium 141,  potassium 4.3, chloride 106, carbonate 27, BUN 7, creatinine 0.74,  glucose 245.  Protime 13.5, INR is 1.  Alkaline phosphatase is 80,  SGOT  is 44, SGPT is 57.  Troponin I studies 0.04, then 0.03.  Once again, the  TSH was 6.433, free T4 was 0.86 a little low as a matter of fact, and  the T4 is 2.6 in the range between 2.3 and 4.2.  This could be a  subclinical hypothyroidism, and the patient may benefit from  levothyroxine therapy.      [name], [place]      Hillis Range, MD  Electronically Signed    GM/MEDQ  D:  02/28/2008  T:  02/28/2008  Job:  [id number]   cc:   Delaney Meigs, M.D.

## 2010-08-19 NOTE — Assessment & Plan Note (Signed)
[facility]                             PULMONARY OFFICE NOTE   KHAYLEE, MCEVOY                     MRN:          [id number]  DATE:08/17/2006                            DOB:          [birth year]    PROBLEMS:  1. Asthma.  2. Allergic rhinitis.  3. Anxiety.  4. Vocal cord dysfunction.  5. Central tremor.  6. Esophageal reflux.  7. Diabetes.   HISTORY:  Unfortunately, she fell breaking her arm and needing surgery  at [facility] described as nerve block and mild sedation. She did well with no  respiratory problems related to the surgery and she says that now her  breathing is really good.   MEDICATIONS:  Her medication list is reviewed and essentially stable,  significant for Advair 500/50 one puff b.i.d., Tessalon-Perles,  albuterol rescue inhaler, and home nebulizer with DuoNeb is rarely  needed. Drug intolerant of TYLOX.   OBJECTIVE:  Weight 177 pounds, blood pressure 134/72, pulse 125, room  air saturation 100%. Lungs were very clear. There was a sling on her  left arm. Heart sounds regular, a little rapid, but without murmur or  gallop. No neck vein distension or strider. Nasal airway not congested.  No peripheral edema.   IMPRESSION:  Asthma is currently stable. I am pleased that she did well  from a respiratory standpoint through her sedation and surgery.   PLAN:  Schedule return in 4 months, earlier p.r.n.     Clinton D. Maple Hudson, MD, Tonny Bollman, FACP  Electronically Signed    CDY/MedQ  DD: 08/18/2006  DT: 08/19/2006  Job #: [id number]   cc:   Delaney Meigs, M.D.

## 2010-08-19 NOTE — Op Note (Signed)
NAME:  [name], [name]              ACCOUNT NO.:  [id number]   MEDICAL RECORD NO.:  [id number]          PATIENT TYPE:  INP   LOCATION:  3707                         FACILITY:  [facility]   PHYSICIAN:  Hillis Range, MD       DATE OF BIRTH:  06/03/1950   DATE OF PROCEDURE:  DATE OF DISCHARGE:                               OPERATIVE REPORT   SURGEON:  Hillis Range, MD   PREPROCEDURE DIAGNOSIS:  Supraventricular tachycardia.   POSTPROCEDURE DIAGNOSIS:  Atrioventricular nodal reentrant tachycardia.   PROCEDURES:  1. Comprehensive electrophysiology study.  2. Induction of tachycardia with pacing.  3. Coronary sinus pacing and recording.  4. Mapping of supraventricular tachycardia.  5. Radiofrequency ablation of supraventricular tachycardia.   DESCRIPTION OF PROCEDURE:  Informed written consent was obtained and the  patient was brought to the Electrophysiology Lab in a fasting state.  She was adequately sedated with intravenous Versed and fentanyl as  outlined in the nursing report.  The patient's right neck and groin were  prepped and draped in the usual sterile fashion by the EP Lab staff.  Using a percutaneous Seldinger technique, one 6-French hemostasis sheath  was placed into the right internal jugular vein.  A 6-French curve  Damato catheter was introduced through the right internal jugular vein  and advanced into the coronary sinus for recording and pacing from this  location.  Using a percutaneous Seldinger technique, two 6-French and  one 8-French hemostasis sheaths were placed into the right common  femoral vein.  Two 6-French quadripolar Josephson catheters were  introduced into the right common femoral vein and advanced into the His  bundle and right ventricular apex positions respectively.  The patient  presented to the Electrophysiology Lab in normal sinus rhythm.  Her AH  interval measured 96 milliseconds with an HV interval of 39  milliseconds.  The RR interval measured 581  milliseconds with a PR  interval of 157 milliseconds, a QRS duration of 87 milliseconds and a QT  interval of 362 milliseconds.  Ventricular pacing was performed which  revealed midline concentric decremental VA conduction with a VA  Wenckebach cycle length of 250 milliseconds.  Ventricular extra stimulus  testing was performed which revealed midline concentric decremental  conduction with no inducible arrhythmias or AH jump.  The ERP was less  than 500/250 milliseconds.  Rapid atrial pacing was then performed which  revealed decremental AV conduction with PR greater than RR and  tachycardia induced.  The tachycardia cycle length was 284 milliseconds  with 1:1 AV conduction.  The VA time measured 0 milliseconds with the  earliest retrograde atrial activation recorded from the His electrode.  PVCs were delivered during His refractoriness which did not advance the  local A or affect the tachycardia.  The tachycardia was therefore felt  to represent classic AV nodal reentrant tachycardia with antegrade  conduction over the slow AV nodal pathway and retrograde conduction over  the fast pathway.  Atrial extra stimulus testing was then performed  which revealed decremental AV conduction with a slow and gradual  presumed AH jump though this was not clearly  present.  Double echo beats  were noted.  The atrial ERP was 500/200 milliseconds and 550/200  milliseconds.  A Biosense Webster 7-French Celsius 4-mm ablation  catheter was introduced through the right common femoral vein and  advanced into the right atrium.  Electroanatomical mapping of Koch's  triangle was performed which revealed a normal size triangle.  The  ablation catheter was positioned at site 9 in Koch's triangle.  A single  radiofrequency application was delivered at 50 watts with a target  temperature of 60 degrees for 60 seconds.  During ablation accelerated  junctional rhythm was observed with intact VA conduction.  Following   ablation, rapid atrial pacing was again performed which revealed no  evidence of PR greater than RR and no tachycardia induced, the AV  Wenckebach cycle length was 330 milliseconds.  Atrial extra stimulus  testing was again performed which revealed decremental AV conduction  with no AH jump or echo beats.  The AV nodal ERP was 450/260  milliseconds.  The patient was observed and rapid atrial pacing was  again performed which revealed no evidence of PR greater than RR with no  tachycardias induced and an AV Wenckebach cycle length of 340  milliseconds.  The procedure was therefore considered completed.  All  catheters were removed and the sheaths were aspirated and flushed.  The  sheaths were removed and hemostasis was assured.  There were no early  apparent complications.   CONCLUSIONS:  1. Normal sinus rhythm upon presentation.  2. Dual AV nodal physiology with inducible AV nodal reentrant      tachycardia.  3. No evidence of accessory pathways.  4. Successful radiofrequency ablation of the slow AV nodal pathway.  5. No inducible arrhythmias following ablation.  6. No early apparent complications.      Hillis Range, MD  Electronically Signed     JA/MEDQ  D:  02/27/2008  T:  02/28/2008  Job:  [id number]   cc:   Wylene Simmer, M.D.

## 2010-08-19 NOTE — Assessment & Plan Note (Signed)
[facility]                       [place] [facility] OFFICE NOTE   Elizabeth, [name]                     MRN:          [id number]  DATE:08/25/2007                            DOB:          [birth year]    The patient is referred for relative tachycardia, palpitations, dyspnea,  multiple coronary risk factors, including hypercholesterolemia,  hypertension and diabetes.   The patient was seen at the [facility] ER on Aug 21, 2007.  She had  awakened with rapid heartbeat.  Apparently, she felt that her heart rate  was in excess of 130.  She has a bit of a medical background.   The patient is bothered by palpitations every week or two.  They are  mostly at night.  There are nonexertional.  She does have significant  diabetes for the last 6 years, it is poorly controlled.  She does not  test her sugar the way she should.  Random sugars at home have been 185.  I do not have a recent hemoglobin A1c on her.  I suspect she has  autonomic dysfunction from poor blood sugar control and vagus nerve  issues.   She also has a history of asthma.  Her inhalers tend to make her  palpitations worse.  She sees Dr. Maple Hudson for this.  She has not had any  recent prednisone tapers.   The patient had a stress test by Dr. Andee Lineman in March of this past year,  which was read as normal.   However, she is a longstanding diabetic with exertional dyspnea, and I  explained to her I do not think that a treadmill test alone was  sensitive enough to rule out coronary disease.   In regards to her palpitations, I told we would get her an event  monitor.  They do not seem to be progressing, but they are intermittent.  When she gets them, there are no long runs, they are flip-flopp0ed, so  they are not associated with diaphoresis, shortness of breath or  presyncope.  Again, they occur mostly at night and sound fairly benign.  I suspect her basal heart rate is somewhat high due to her  being  overweight, her asthma and diabetes.  The patient was seen in the ER.  She ruled out for myocardial infarction.  There were no arrhythmias.  She was discharged home for observation.  Thyroid studies at that time  were also normal with a free T4 of only 5.5.  Even in the ER, her  glucose was elevated at 136.   REVIEW OF SYSTEMS:  Otherwise negative.   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  She has had previous cholecystectomy,  hysterectomy, tonsillectomy, previous right breast cyst.  She broke her  left elbow and was seen at [facility] recently for this, it is slow to heal.   The patient is happily married.  She has one daughter, she will be a  grandparent in November.  She works at [employer].  She does not smoke or  drink.  Her  husband house with her today.  They seem to have a good  relationship.   FAMILY HISTORY:  Noncontributory.   CURRENT MEDICATIONS:  1. Januvia 100 mg a day.  2. Glipizide 10 mg a day.  3. Avapro 75 a day.  4. Metformin 1 gm b.i.d.  5. Simvastatin 10 a day.  6. Mysoline 50 t.i.d.  7. Nexium 40 a day   ALLERGIES:  1. TYLOX.  2. OXYCODONE.  3. QUESTION TO OPIATE AGONIST.   PHYSICAL EXAMINATION:  GENERAL:  Remarkable for an overweight white  female in no distress.  Affect is jovial.  VITAL SIGNS:  Her weight is 181, blood pressure is 150/90, pulse is 98  and regular, afebrile, respiratory rate 14.  HEENT:  Unremarkable.  NECK:  Carotids are normal without bruit.  No lymphadenopathy,  thyromegaly or JVP elevation.  LUNGS:  Clear diaphragmatic motion.  No  wheezing.  CARDIOVASCULAR:  S1-S2 with normal heart sounds.  PMI normal.  ABDOMEN:  Protuberant.  Bowel sounds positive.  No AAA, no bruit.  No  hepatosplenomegaly or hepatojugular reflux.  Previous C-section scar, as  well as appendectomy scar and cholecystectomy  scar.  EXTREMITIES:  Distal pulses are intact.  No edema.  She has weakness in  the left arm from her olecranon fracture and has a soft brace on it.  NEURO:  Nonfocal.  SKIN:  Warm and dry.   EKG essentially shows sinus rhythm with no abnormalities.  I again  reviewed all of her ER work from Aug 21, 2007, reviewed lab work and  EKGs from [facility] stay.   IMPRESSION:  1. Relative tachycardia, likely due to vagus nerve problem from      uncontrolled diabetes.  She had poor heart rate variability during      my exam.  No indication for beta-blocker at this time.  Check 21-      day event monitor.  2. Uncontrolled diabetes.  I suspect her hemoglobin A1c is running 9.      I encouraged her to follow-up with Dr. Lysbeth Galas.  She needs better      dietary therapy and better structure for her monitoring.  Again, I      am fairly certain that this is part of the reason for high resting      tachycardia.  3. Chronic obstructive pulmonary disease/asthma.  This is part of the      reason I would like to avoid beta-blockers.  She particular gets      worse in the springtime.  Follow-up with Dr. Maple Hudson.  Continue      Advair and Astelin.  Try to avoid prednisone tapers as needed.      Consider baseline PFTs pre and post bronchodilator.  4. Hypertension, currently well-controlled.  Continue Avapro in lieu      of longstanding diabetes.  She probably needs to have a 24-hour      urine protein check to see if it is an adequate dose to decrease      microalbuminuria.  5. Hypercholesterolemia.  Continue Zocor.  Lipid and liver profile in      6 months.   Given the patient's dyspnea with relative tachycardia, she will have a 2-  D echocardiogram to assess RV and LV function.  As indicated previously,  she had a treadmill test in March.  I do not think this is sensitive  enough to rule out significant coronary disease in a longstanding  diabetic.  She will  come back for a stress Myoview.   As long as these  two tests are low-risk, she will follow up with primary care MD.  We  will see her back in a couple months to reassess her event monitor.     Noralyn Pick. Eden Emms, MD, [facility]  Electronically Signed    PCN/MedQ  DD: 08/25/2007  DT: 08/25/2007  Job #: [id number]   cc:   Delaney Meigs, M.D.

## 2010-08-19 NOTE — Consult Note (Signed)
[name], [name]              ACCOUNT NO.:  [id number]   MEDICAL RECORD NO.:  [id number]          PATIENT TYPE:  INP   LOCATION:  3707                         FACILITY:  [facility]   PHYSICIAN:  Hillis Range, MD       DATE OF BIRTH:  06-Nov-1950   DATE OF CONSULTATION:  DATE OF DISCHARGE:                                 CONSULTATION   REASON FOR CONSULTATION:  Supraventricular tachycardia.   HISTORY OF PRESENT ILLNESS:  Ms. Scherer is a very pleasant 60 year old  female with a history of hypertension, hyperlipidemia, diabetes, and  newly-diagnosed supraventricular tachycardia.  The patient reports that  over the past 10 years, she has had episodes of abrupt onset and  termination of heart racing.  She notes that these episodes most  frequently occur at night while resting, but have also occurred during  the day.  She is unaware of any triggers or precipitants for her  tachycardia.  She has previously tried vagal maneuvers to terminate her  tachycardia.  She notes that typically she will assume in a recumbent  position and allow it to terminate on its own.  She reports spontaneous  termination of tachycardia typically within 10 or 15 minutes.  Over the  past 6 weeks, she has had increasing frequency and duration of  palpitations.  Most recently, she reports that yesterday while working  at [employer], she developed abrupt onset of heart racing with associated  diaphoresis, chest discomfort, and presyncope.  EMS was called and upon  their presentation, she was documented to have a narrow complex short RP  tachycardia at 215 beats per minute.  The patient received intravenous  adenosine which immediately terminated her tachycardia.  She reports  immediate relief of chest discomfort and presyncopal symptoms.  She was  admitted to [facility] and observed overnight without further  arrhythmias.  She ruled out for myocardial infarction with serial  cardiac markers.  She was initiated on  metoprolol 25 mg twice daily.  No  significant EKG changes were observed.  The patient was evaluated by Dr.  Phillips Odor who recommended EP consultation.  The patient is therefore  transferred to [facility] for EP consultation and further  evaluation.  Presently, the patient reports doing well.  She denies  chest discomfort, shortness of breath, palpitations, presyncope, or  syncope.  Of note, the patient reports multiple episodes of atypical  chest discomfort over the past month for which she was previously  admitted to [facility], most recently February 13, 2008, through  February 14, 2008.  A GXT Myoview was obtained which revealed a  preserved ejection fraction of 72% with no perfusion abnormalities.  She  was diagnosed with gastroesophageal reflux and has been initiated on  proton pump inhibitors with significant improvement in her chest  discomfort.   PAST MEDICAL HISTORY:  1. Supraventricular tachycardia (as above).  2. Diabetes mellitus.  3. Hypertension.  4. Hyperlipidemia.  5. Benign essential tremor.  6. Asthma.  7. Gastroesophageal reflux disease.  8. Atypical chest pain (as above).   PAST SURGICAL HISTORY:  Tonsillectomy, removal of right breast  cyst  which was benign, cholecystectomy, partial hysterectomy, C-section, left  ankle fracture and repair x2, left elbow fracture and repair x2.   SOCIAL HISTORY:  The [place]  [place]ouse.  She works part-time at [employer].  She denies  tobacco, alcohol, or drug use.   FAMILY HISTORY:  The patient's sister died suddenly at age 60 in 2008/02/12.   REVIEW OF SYSTEMS:  All systems were reviewed and negative except as  outlined in the HPI above.   MEDICATIONS:  1. Metoprolol 25 mg b.i.d.  2. Humalog insulin sliding scale.  3. Metformin 1000 mg b.i.d.  4. Januvia 100 mg daily.  5. Glipizide 10 mg b.i.d.  6. Multivitamin daily.  7. Calcium plus vitamin D 600 mg  daily.  8. Amitriptyline 20 mg nightly.  9. Simvastatin 10 mg nightly.  10.Mysoline 250 mg nightly.  11.Advair 250/50 b.i.d.  12.Darvocet q.6 h. p.r.n.  13.Avapro 150 mg daily.   ALLERGIES:  TYLOX causes itching, though she does tolerate Vicodin.   PHYSICAL EXAMINATION:  VITALS:  Blood pressure of 130/79, heart rate  101, respirations 20, temperature 98.0, sats 98% on room air.  GENERAL:  The patient is a well-appearing obese female in no acute  distress.  She is alert and oriented x3.  HEENT:  Normocephalic, atraumatic.  Sclerae clear.  Conjunctivae pink.  Oropharynx clear.  NECK:  Supple.  No JVD, lymphadenopathy, or bruits.  LUNGS:  Clear to auscultation bilaterally.  HEART:  Regular rate and rhythm.  No murmurs, rubs, or gallops.  GASTROINTESTINAL:  Soft, nontender, nondistended.  Positive bowel  sounds.  EXTREMITIES:  No clubbing, cyanosis, or edema.  NEUROLOGIC:  Strength and sensation appear to be intact.  The patient  has a diffuse essential tremor as well as mild dysarthria.  SKIN:  No ecchymosis or laceration.  MUSCULOSKELETAL:  No deformity or atrophy.  PSYCHIATRIC:  Euthymic mood.  Flat affect.   LABORATORY DATA:  Pending.   IMPRESSION:  Ms. Gerety is a very pleasant 60 year old female with newly  diagnosed supraventricular tachycardia.  She also has hypertension,  hyperlipidemia, diabetes, and atypical chest pain.  A recent Myoview  revealed evidence of ischemia.  The patient reports increasing symptoms  of supraventricular tachycardia recently and wishes to consider catheter  ablation.   PLAN:  Therapeutic strategies for supraventricular tachycardia including  both, medicine and catheter-based therapies were discussed in detail  with the patient today.  Risks, benefits, and alternatives to EP study  and radiofrequency ablation were also discussed in detail.  These risks  include but are not limited to bleeding, vascular  damage, pneumothorax, pericardial  effusion, perforation, AV nodal block  with need for pacemaker, death, heart attack, and stroke.  The patient  understands these risks and wishes to proceed.  We will therefore  schedule the patient for EP study and radiofrequency ablation for  supraventricular tachycardia at the next available time.      Hillis Range, MD  Electronically Signed     JA/MEDQ  D:  02/26/2008  T:  02/27/2008  Job:  [id number]

## 2010-08-19 NOTE — Discharge Summary (Signed)
[name], Elizabeth              ACCOUNT NO.:  [id number]   MEDICAL RECORD NO.:  [id number]          PATIENT TYPE:  INP   LOCATION:  3707                         FACILITY:  [facility]   PHYSICIAN:  [name], PA   DATE OF BIRTH:  1950-11-04   DATE OF ADMISSION:  02/26/2008  DATE OF DISCHARGE:  02/28/2008                               DISCHARGE SUMMARY   ADDENDUM   I would like to mention this patient has allergy to TYLOX, earliest  intolerance of It.   This addendum considers a medication change.  The patient has been  fairly tachycardic and in fact her heart rate will advance into the 120s  to 130s with minimal activity.  At the time of discharge February 28, 2008, she had been placed on metoprolol 25 mg twice a day at [facility].  This had not been continued here.  She had a successful  ablation of AVNRT on February 27, 2008, but has been tachycardiac, and  as I said with activity, her heart rate will bound over 100 beats per  minute, which is what it is at resting.  I am going to continue  metoprolol 25 mg twice daily and hold on the Avapro.  Her Avapro dose  was 150, so I have communicated with this patient and given her a  prescription for this.  Her medication is metoprolol 25 mg twice daily  holding Avapro.      [name], PA     GM/MEDQ  D:  02/28/2008  T:  02/28/2008  Job:  [id number]   cc:   Antonieta Loveless [facility]  Delaney Meigs, M.D.

## 2010-08-19 NOTE — Assessment & Plan Note (Signed)
[facility]                             PULMONARY OFFICE NOTE   [name], [name]                     MRN:          [id number]  DATE:12/21/2006                            DOB:          12/29/1950    PROBLEMS:  1. Asthma.  2. Allergic rhinitis.  3. Anxiety.  4. Vocal cord dysfunction.  5. Essential tremor.  6. Esophageal reflux.  7. Diabetes.   HISTORY:  She had hurt her left arm and had surgery without problems  earlier this year.  She is now going for revision surgery at [facility].  She  says breathing has been well controlled but she has bothersome sinus  drainage posteriorly.  She has settled on b.i.d. Benadryl as her drug  regimen of preference.  She has been out of work all summer due to her  arm.  Using Advair 250, down from Advair 500/50.   MEDICATIONS:  1. Zyrtec 10 mg.  2. Advair 250.  3. Metformin 1000 mg b.i.d.  4. Glipizide ER 5 mg.  5. Januvia 50 mg.  6. Astelin b.i.d. p.r.n.  7. Zocor 20 mg.  8. Nexium 40 mg.  9. Mysoline 50 mg t.i.d.  10.Avapro.  11.Oxycodone.  12.Lexapro.  13.Albuterol rescue inhaler.  14.Home nebulizer with DuoNeb.   DRUG INTOLERANCES:  TYLOX.   OBJECTIVE:  VITAL SIGNS:  Weight 197 pounds, blood pressure 132/88,  pulse 112, room air saturation 98%.  GENERAL APPEARANCE:  She is joking and laughing easily, not in distress.  LUNGS:  Lung fields are really quite clear today.  She does have a  significant tremor in the hands with head bob.  CARDIOVASCULAR:  Heart sounds are regular, a little rapid without murmur  or gallop.   IMPRESSION:  1. Rhinitis.  2. Asthma under good control.  3. Esophageal reflux.  4. Traumatic injury left arm for further surgical repair at [facility].   PLAN:  1. Sample of Nasacort AQ for trial in comparison with what she had      been using.  2. We refilled her Advair 250/50.  3. Schedule return in four months, earlier p.r.n.     Clinton D. Maple Hudson, MD, Tonny Bollman, FACP  Electronically Signed    CDY/MedQ  DD: 12/21/2006  DT: 12/22/2006  Job #: [id number]   cc:   Delaney Meigs, M.D.

## 2010-08-19 NOTE — Letter (Signed)
March 27, 2008    Delaney Meigs, MD  [address]  [place], [place] [address]   RE:  [name], [name]  MRN:  [id number]  /  DOB:  08-24-1950   Dear Dr. Lysbeth [name]:   It is my pleasure to write to you regarding electrophysiology followup  of [name].  As you recall, she is a very pleasant 60 year old  female with a history of hypertension, diabetes, atypical chest pain,  and recently diagnosed AV nodal reentrant tachycardia.  She presented  with supraventricular tachycardia in mid November.  I performed EP study  on November 23, which revealed classic AV nodal reentrant tachycardia.  She underwent successful ablation of the slow AV nodal pathway at that  time.  She reports doing very well since ablation.  She denies any  further symptomatic episodes of heart racing or palpitations.  She  reports having 1 episode of chest pain recently and presented to the  emergency department, where she was found to have sinus rhythm with  negative cardiac markers.  She was initiated on Nexium and has had no  further chest discomfort since that time.  She denies shortness of  breath,  orthopnea, PND, presyncope, syncope, or other concerns today.   PROBLEM LIST:  1. 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  2. Atypical chest pain with a graded exercise test Myoview on November      10 which revealed an ejection fraction of 72% with no perfusion      abnormalities.  3. Gastroesophageal reflux disease.  4. Diabetes mellitus.  5. Hypertension.  6. Hyperlipidemia.  7. Benign essential tremor.   CURRENT MEDICATIONS:  1. Amlodipine 5 mg daily.  2. Aspirin 325 mg daily.  3. Topamax 150 mg daily.  4. Amitriptyline 50 mg nightly.  5. Primidone 250 mg nightly.  6. Simvastatin 20 mg nightly.  7. Metoprolol 25 mg nightly.  8. Glipizide XL 10 mg daily.  9.  Metformin 500 mg b.i.d.  10.Lorazepam 1 mg daily.  11.Januvia 100 mg daily.  12.Calcium plus vitamin D daily.  13.Multivitamin daily.  14.Mucinex b.i.d.  15.Nexium 40 mg daily.   PHYSICAL EXAMINATION:  VITAL SIGNS:  Blood pressure 122/90, heart rate  100, respirations 18, and weight 180 pounds.  GENERAL:  The patient is a well-appearing female in no acute distress.  She is alert and oriented x3.  HEENT:  Normocephalic and atraumatic.  Sclerae are clear.  Conjunctivae  are pink.  Oropharynx is clear.  NECK:  Supple.  No JVD, lymphadenopathy, or bruits.  LUNGS:  Clear to auscultation bilaterally.  HEART:  Regular rate and rhythm.  No murmurs, rubs, or gallops.  GI:  Soft, nontender, and nondistended.  Positive bowel sounds.  EXTREMITIES:  No clubbing, cyanosis, or edema.  NEUROLOGIC:  The patient has a benign resting tremor, otherwise  unremarkable.  SKIN:  No ecchymosis or laceration.  MUSCULOSKELETAL:  No deformity or atrophy.   EKG reveals sinus rhythm at 100 beats per minute with the PR interval of  136 milliseconds and no ST/T-wave changes.   IMPRESSION:  [name] is a very pleasant 60 year old female with a  history of atypical chest pain, hypertension, diabetes, and recent  atrioventricular nodal reentrant tachycardia status post ablation.  She  appears to be doing quite well following ablation with no postprocedural  complications.  She has had  no further symptomatic supraventricular  tachycardia.  I feel quite certain that her atrioventricular nodal  reentrant tachycardia has been cured.  Her atypical chest pain has  resolved with Nexium.   PLAN:  1. No medication changes were made today.  The patient has a mild      resting tachycardia, which she has had for many years.  We could      consider increasing her metoprolol to 50 mg nightly and this may      actually improve her resting tremor as well.  As she is presently      asymptomatic and doing quite well, I did not  make this change      today.  She will follow up with Dr. Lysbeth [name] as previously scheduled.  2. The patient is aware that she may return to my clinic at any time      should further cardiac issues arise.   Dr. Lysbeth [name], thank you again for the opportunity of participating in the  care of [name].  Please feel free to contact me if I can be of  further assistance.    Sincerely,      Hillis Range, MD  Electronically Signed    JA/MedQ  DD: 03/27/2008  DT: 03/27/2008  Job #: [id number]

## 2010-08-19 NOTE — Discharge Summary (Signed)
NAME:  [name], [name]              ACCOUNT NO.:  [id number]   MEDICAL RECORD NO.:  [id number]          PATIENT TYPE:  INP   LOCATION:  5525                         FACILITY:  [facility]   PHYSICIAN:  Clinton D. Maple Hudson, MD, FCCP, FACPDATE OF BIRTH:  Aug 15[birth year] 1948   DATE OF ADMISSION:  07/19/2006  DATE OF DISCHARGE:  07/23/2006                               DISCHARGE SUMMARY   DISCHARGE DIAGNOSES:  1. Asthmatic bronchitis with acute exacerbation.  2. Vocal cord dysfunction syndrome.  3. Anxiety with depression.  4. Diabetes mellitus type 2, non-insulin-dependent, adult-onset.  5. Esophageal reflux.  6. Chronic essential tremor.  7. Essential hypertension.  8. Hypercholesterolemia.  9. Allergic rhinitis.   HISTORY OF PRESENT ILLNESS:  A 60 year old, white female, former smoker  who presented through the office with 3-4 weeks of increasing cough and  wheeze which had not responded to outpatient therapy including steroids  and antibiotics.  She had been to the [facility] a few  days prior and then again 3 days prior to admission for nebulizer  treatments and steroid therapy.  She had noted persistent head  congestion and at admission was taking prednisone with recent Zithromax,  Cipro and doxycycline.  She thought the hot weather was on of her  triggers.  She has known history of vocal cord dysfunction, anxiety and  depression which complicates her management.   PAST MEDICAL HISTORY:  1. Multiple hospitalizations and emergency room visits for asthma.  2. Bronchoscopy showing mucus plugs and bronchitis.  3. Upper endoscopy with mild esophagitis.  4. Type 2 insulin-dependent diabetes.  5. Anxiety/depression.  6. Essential tremor.  7. Esophageal reflux.  8. Allergic rhinitis with positive allergy testing.   PAST SURGICAL HISTORY:  1. C-section section.  2. Resection of cyst on her leg.  3. Bilateral benign breast cyst.  4. Cholecystectomy.  5. Partial hysterectomy.  6.  Bartholin's cyst.  7. Two ligament repairs of the left ankle.  8. Resection of a ganglion on the wrist.  9. Tonsillectomy.   PHYSICAL EXAMINATION:  VITAL SIGNS:  On admission, BP 160/120, room air  saturation 97%.  GENERAL:  Tearfulness.  LUNGS:  Harsh cough with wheeze and scattered rhonchi.  HEART:  Normal heart sounds, no edema.  NEUROLOGIC:  Chronic tremor.   HOSPITAL COURSE:  She was given intravenous steroids and antibiotics,  nebulized bronchodilators, prophylactic DVT coverage with Lovenox,  sliding scale insulin coverage with moderate scale.  A barium swallow  did not show obvious problems.  Blood pressure was treated with Diovan.  At discharge, she had improved substantially.  Blood sugars were running  a little high, around 281, as she tapered steroids.  Blood pressure was  135/91.   LABORATORY DATA AND X-RAY FINDINGS:  Pulmonary function tests were  within normal limits.  Limited CT scan of the sinuses showed minimal  right-sided maxillary sinus disease and right-sided ethmoid sinus  disease, otherwise clear.  Barium swallow showed a widely patent distal  esophageal mucosal ring ([name] ring) thought unlikely to be causing  symptoms and a widely patent A-ring with a small Zenker's diverticulum  with no reflux demonstrated.   Admission white blood count was 9400 with hemoglobin of 13.4 and 83%  neutrophils.  Stool for occult blood was negative.  Coagulation studies  were normal.  Admission glucose 291 with recent outpatient steroids, BUN  of 10, creatinine to 0.72.  Liver enzymes normal.  Liver enzymes normal.  Hemoglobin A1c was 7.8.  IgE 90.6.  Urinalysis negative for sputum, grew  normal flora.   DIET:  Diabetic diet.   DISCHARGE MEDICATIONS:  1. Metformin 1000 mg b.i.d.  2. Glipizide 5 mg.  3. Avapro 150 mg to replace Benazepril.  4. Januvia 50 mg.  5. Mysoline 50 mg x3.  6. Nexium 40 mg.  7. Zocor 20 mg.  8. Zyrtec 10 mg p.r.n. or Benadryl.  9.  Prednisone taper over 8 days from 40 mg.  10.Phenergan with codeine cough syrup 1 tsp q.6 h. p.r.n.  11.Advair 500/50 one puff b.i.d.  12.Albuterol two puffs q. 4 h. p.r.n.  13.Astelin nasal spray once or twice b.i.d. p.r.n.  14.Home nebulizer with DuoNeb up to four times a day p.r.n.  15.Zithromax (Z-Pak) to hold.  16.Tessalon Perles 100 mg to take one or two every 6 hours as needed.   FOLLOW UP:  Follow up with Dr. Jetty Duhamel in 3 weeks and with Dr.  Joette Catching as scheduled for primary care followup.      Clinton D. Maple Hudson, MD, Tonny Bollman, FACP  Electronically Signed     CDY/MEDQ  D:  09/22/2006  T:  09/23/2006  Job:  [id number]   cc:   Delaney Meigs, M.D.

## 2010-08-22 NOTE — H&P (Signed)
[place]. [facility]  Patient:    [name], [name]                     MRN: [id number] Adm. Date:  [id number] Attending:  Young, Copy                         History and Physical  ADMITTING DIAGNOSES:  1. Unstable asthma.  2. Possible intermittent reflux.  3. Essential tremor.  HISTORY OF PRESENT ILLNESS: This patient is a 60 year old asthmatic diabetic who has been calling the office and coming in repeatedly over the last several weeks because of attacks of wheezing and dyspnea at home on outpatient therapy, which has been fairly aggressive.  She had asthma as a child which improved during her 35s and 30s.  She has been worse with more frequent episodes, especially in the last six years.  Typical triggers have included strong odors and stress, seasonal allergens - especially in the spring and fall, and winter time colds.  Shes and her husband both notice she is consistently worse after eating but she has not felt much reflux while taking chronic Prevacid.  Allergy skin testing was positive but she was on allergy vaccine about 20 years ago for allergic rhinitis.  She has had multiple hospitalizations, especially at [facility], in the last 1-1/2 years and many emergency room visits there.  Work-up reportedly included an upper endoscopy which showed, "a little inflammation", and bronchoscopy which showed mucus plugs.  She has not recognized particular problems with aspirin.  At one time she questioned [place] food but she and her husband both feel that is not a significant trigger.  MEDICATIONS:  1. Flovent 220 2 puffs b.i.d.  2. Serevent inhalers 2 puffs b.i.d.  (She prefers these over Advair).  3. Home nebulizer with albuterol and Atrovent p.r.n.  4. Albuterol inhaler.  5. Prozac 20 mg q.d.  6. Amaryl 2 mg q.d.  7. Prevacid 30 mg q.d.  8. Uniphyl 400 mg q.d. was started just for trial in the last two days.  9. Phenergan with codeine  occasionally for cough. 10. Vioxx occasionally for arthritic ankle pain.  ALLERGIES: TYLOX causes "inner itching", but tolerates other narcotics.  FAMILY HISTORY: Emelia Loron was a smoker and died of lung cancer.  Father was a smoker and died of lung cancer.  Mother is alive with diabetes and essential tremor.  One sister has diabetes.  One daughter is alive and well at age 44. One son died of a brain tumor.  SOCIAL HISTORY: Married, working at [employer] around [employer] but says she has no problems at work except that it is hot.  She is a former Psychiatrist.  She quit smoking several years ago.  REVIEW OF SYSTEMS: Sinus drainage and seasonal rhinitis.  Ribs sore from coughing but no other chest pain or palpitations.  Coughing fits bring up plugs of clear mucus.  No recognized reflux or obvious aspiration.  Sleeps on one pillow.  Without ankle edema.  Occasionally loose stools.  She suggests she may have been considered once to have irritable bowel syndrome.  Hot flashes, not on hormones.  Sore ankles with history of arthritis.  PAST MEDICAL HISTORY:  1. Depression.  2. Essential tremor.  3. Non-insulin dependent diabetes.  4. Bronchoscopy in [place], [place] for mucus plugs in spring 2001.  5. Upper endoscopy showing "a little inflammation".  No history of positive PPD.  No history of heart disease, hypertension, cancer, deep vein thrombosis, pulmonary embolus, or thyroid problems.  PAST SURGICAL HISTORY:  1. Cyst on left leg.  2. Tonsillectomy.  3. Breast cysts, which were benign.  4. Gallbladder.  4. Cesarean section.  5. Partial hysterectomy.  6. Bartholin cyst.  7. Two ligament repairs on ankles.  PHYSICAL EXAMINATION:  VITAL SIGNS: Temperature 99.2 degrees, pulse 117-120 and regular, respirations 22 per minute, blood pressure 139/92.  Room air oxygen saturation 98%.  GENERAL: Alert, maybe mildly anxious.  Pleasant and cooperative woman. Husband is  with [place] during interview and examination.  SKIN: Clear.  LYMPHATICS: No adenopathy found.  HEENT: Head atraumatic.  Gross vision and hearing intact.  PERRLA.  EOMI. Oral mucosa normal.  Tongue midline.  Speech clear.  Nasal airways clear. NECK: Supple.  Brief stridor intermittently heard, relieved by relaxation.  No bruits. JVD 2 cm sitting upright.  Normal thyroid.  BREAST: Without dominant mass.  CHEST: Prolonged expiratory phase, mild grunting, minimal cough.  No wheeze, rales or rhonchi.  Respiratory effort is unlabored.  HEART: Heart sounds regular without murmur or gallop.  ABDOMEN: Soft, nontender, without hepatosplenomegaly.  PELVIC/RECTAL: Not done, not indicated at this time.  EXTREMITIES: No clubbing or cyanosis or peripheral edema.  NEUROLOGIC: Mild tremor is exaggerated with intention and she has to take care not to spill her drink.  LABORATORY DATA: Chest x-ray is clear, within normal limits.  IMPRESSION/PLAN: Episodic asthma/attacks of wheezing and dyspnea at home.  I am not sure if there is an anxiety component, and I think she may have some vocal cord dysfunction aggravated by her essential tremor.  I do wonder about intermittent reflux possibly also aggravated by the effect of her essential tremor on vocal cord stability.  I would like to see what a barium swallow for reflux assessment looks like and we are going to get pulmonary function tests while she is here.  We will manage her with stable bronchodilators including nebulizer treatments and IV Solu-Medrol, and watch for the pattern of respiratory discomfort. DD:  09/24/99 TD:  09/25/99 Job: [id number]

## 2010-08-22 NOTE — H&P (Signed)
[place]. [facility]  Patient:    [name], [name]                     MRN: [id number] Adm. Date:  [id number] Attending:  Young, Copy                         History and Physical  ADMITTING DIAGNOSES: 1. Asthmatic bronchitis with status asthmaticus. 2. Diabetes type 2, non-insulin dependent. 3. Recent antibiotic-related colitis, resolving. 4. Anxiety with depression and vocal cord dysfunction. 5. Essential tremor.  CHIEF COMPLAINT:  Asthma attack, short of breath.  HISTORY OF PRESENT ILLNESS:  This 60 year old white female has been having increasing nasal congestion, chest tightness, wheezing dyspnea, and yellowing sputum over the last week or week and a half.  She had gotten a nebulizer treatment a few days ago at her primary physicians.  She called me yesterday to discuss treatment options if she continued active use of her own meds, and she was given an albuterol nebulizer treatment with Depo-Medrol 80 mg IM and _________ this morning.  She called back this afternoon reporting repeated episodes of wheezing and chest tightness with only partial benefit from home treatment and is admitted to stabilize a suspected viral triggered exacerbation.  REVIEW OF SYSTEMS:  Tired, coughing, short of breath, waking at night with coughing.  No palpitation, chest pain, or blood.  No definite fever, chills, or sweats.  Weight has been stable.  Tussive chest, soreness, occasional reflux, recent diarrhea blamed on an antibiotic, now resolving.  No leg pain, calf tenderness, or edema.  Chronic mild stiffness, left ankle.  Low back strain syndrome since April.  ALLERGIC:  No medication allergy.  Recent colitis and diarrhea attributed to Cefzil.  PAST MEDICAL HISTORY:  Asthmatic bronchitis with multiple hospitalizations and emergency room visits, off and on since the 1980s.  Childhood asthma.  Last hospitalized here with asthmatic bronchitis exacerbation in June  of 2001, at which time she had a normal modified barium swallow.  Other previous workup has included a bronchoscopy showing mucous plugs and bronchitis, upper endoscopy showing mild esophagitis.  Diabetes, non-insulin dependent, type 2. Anxiety and depression with vocal cord dysfunction, essential tremor, gastroesophageal reflux symptoms, diet-related.  Allergic rhinitis with positive allergies, skin testing for common allergens several years ago.  SURGICAL HISTORY:  C-section, cyst on leg, bilateral benign breast cysts, cholecystectomy, partial hysterectomy, Bartholins cyst, two ligament repairs, left ankle, ganglion on wrist, tonsils.  REVIEW OF SYSTEMS:  No history of heart disease, cancer, DVT, or pulmonary embolism.  SOCIAL HISTORY:  Married, one grown daughter, former Garment/textile technologist, now working for [employer].  Husband works also at a [employer].  She quit smoking many years ago.  FAMILY HISTORY:  Grandfather died of lung cancer, father died of lung cancer, mother living with diabetes, one sister diabetic, one daughter alive and well. A son died in infancy of brain tumor.  HOME MEDICATIONS:  1. Nebulizer with albuterol and Atrovent.  2. Flovent 220.  3. Atrovent inhaler.  4. Allegra.  5. Singulair.  6. Nexium 40 mg q.d.  7. Xanax 0.5 mg t.i.d. p.r.n. for anxiety and for tremor.  8. Restoril 15 mg q.h.s. p.r.n. sleep.  9. Amaryl 4 mg q.d. 10. Actose 15 mg q.d. 11. Zanaflex 4 mg b.i.d. p.r.n. 12. Recently she has also taken Rynatan, Tussi-12, Nasonex.  TRIGGERS:  Viral syndromes, stress, fatigue, smoke exposure.  Seasonal rhinitis  with pollen exposure.  PHYSICAL EXAMINATION:  VITAL SIGNS:  Temperature 98, pulse 103, blood pressure 132/65, room air saturation 96%.  Admission CBG 147.  GENERAL APPEARANCE:  Alert, obese white female, oriented, cooperative.  SKIN:  No rash.  ADENOPATHY:  None found.  HEENT:  Atraumatic, contact lenses.   PERRLA.  Conjunctivae clear.  Mucoid post nasal drip, hoarse.  Slightly red posterior pharynx.  Watery rhinorrhea anteriorly without polyps.  No stridor.  NECK:  Neck veins distended 1 cm.  Normal thyroid.  CHEST:  Wheezy cough, some pursed-lip breathing, coughing up creamy yellow sputum, bilateral and expiratory wheezes heard best posteriorly in the bases.  HEART:  Regular rhythm, normal S1, S2, no murmur or gallop.  ABDOMEN:  Nontender.  Bowel sounds present, no hepatosplenomegaly.  BREASTS/GENITOURINARY/RECTAL:  Not examined, no complaints, not indicated.  EXTREMITIES:  Essential tremor, regular pulses, no cyanosis, clubbing, or edema.  Calves are firm.  Negative Homans.  No cords.  IMPRESSION:  Asthmatic bronchitis, failing to respond to outpatient therapy with appropriate bronchodilator and maintenance anti-inflammatory components. Probable trigger was a viral illness, although I am not sure about current ozone atmosphere warnings as a component.  She has failed to clear over the past week with repeated interventions and is admitted to stabilize anticipating use of intravenous steroids, regular nebulizer therapy, and evaluation for additional needs.  Because of her recent antibiotic colitis which may have been C. difficile, I want to avoid antibiotic use if possible. Diabetes is usually adequately controlled on oral therapy, but is likely to flare on steroids and we were covering with sliding scale insulin.  Her history of anxiety, depression, and vocal cord dysfunction is understood.  I will continue her Xanax, anticipating she may be somewhat worse in hospital with medications, and this will be addressed p.r.n.  The viral pulmonary will be covered this weekend. DD:  09/10/00 TD:  09/11/00 Job: [id number]

## 2010-08-22 NOTE — Discharge Summary (Signed)
[place]  Patient:    [name], [name]                     MRN: [id number] Adm. Date:  [id number] Disc. Date: [id number] Attending:  Jetty Duhamel Driver CC:         Dr. Kirstie Peri, [place], [place]  Genene Churn. Sherin Quarry, M.D., [place], [place]   Discharge Summary  DISCHARGE DIAGNOSES: 1. Asthma with status asthmaticus. 2. Diabetes mellitus type 2, non-insulin dependent, adult onset. 3. Depression with anxiety. 4. Essential tremor. 5. Gastritis. 6. Esophagitis without hemorrhage.  BRIEF HISTORY:  A 60 year old white female with chronic asthma, allergic rhinitis and remote smoking history who was admitted with one week history of increasing nasal congestion, chest tightness, wheezing, dyspnea, and yellow sputum.  She had received outpatient nebulizer treatments and Cortisone at two different physician offices in the past week and called reporting repeated episodes of exacerbation during the afternoon only partially benefiting from her home medications including nebulizer and she was admitted for stabilization.  She reported a recent colitis with diarrhea attributed to antibiotic therapy with Cefzil, which had just recently returned to normal.  PAST MEDICAL HISTORY:  Significant for recurrent episodic asthmatic bronchitis since the 1980s, with childhood asthma; previous hospitalization for asthmatic bronchitis in June of 2001.  Non-insulin-dependent type 2 diabetes, anxiety and depression with a component of vocal cord dysfunction, essential tremor and gastroesophageal reflux.  Allergic rhinitis, history of back strain syndrome. Surgeries for C-section, benign breast cyst, cholecystectomy, partial hysterectomy, ligament repairs on ankle, ganglion cyst.  ADMISSION PHYSICAL EXAMINATION:  Significant for temperature 98, pulse 103. Watery rhinorrhea, wheezy cough, pursed lips, sputum creamy yellow in color and expiratory wheeze in the bases.  Normal heart  sounds.  Essential tremor without clubbing, cyanosis, or edema.  HOSPITAL COURSE:  She was begun on aerosol nebulizers, intravenous steroids, sliding scale insulin coverage for steroid aggravation of her diabetes, anxiolytics, antireflux measures.  She was seen in consultation by Genene Churn. Sherin Quarry, M.D., with complaints of heartburn and mild dysphagia despite Protonix and underwent upper endoscopy demonstrating distal esophagitis without stricture, prepyloric antral gastritis with CLOtest pending.  She gradually improved and therapies were converted to p.o. as appropriate. She received education on a GERD and diabetic diet.  She was tearful discussing problems with mood including marital and family stress and was directed to her local mental health clinic for support.  She was discharged significantly improved to return to outpatient follow-up.  LABORATORY:  Pulmonary function testing was done while on intravenous Solu-Medrol with recent bronchodilator nebulizers which completely cleared wheezing right at the time of testing.  Spirometry before and after bronchodilator measured lung volumes and diffusion capacity were all normal consistent with history of asthma.  EKG showed normal sinus rhythm, normal EKG.  Chest x-ray showed normal cardiac silhouette, clear lung fields with stable degenerative changes of the thoracic spine.  White blood cell count rose from 5900 on admission to 14,200 on steroids, hemoglobin 13.4, platelet count normal.  Occult blood in stool negative. Chemistry panel normal, although glucose rose from 104 to 223 on steroid therapy.  Liver enzymes were normal.  Urinalysis unremarkable.  Sputum was rejected by the lab.  Helicobacter study negative for urease.  DISCHARGE PLANS:  1. Home nebulizer with albuterol and Atrovent q.i.d. p.r.n.  2. Flovent 220 two puffs b.i.d.  3. Atrovent or albuterol metered dose inhaler q.4h. p.r.n.  4. Serevent two puffs b.i.d.  5.  Singulair 10 mg  q.d.  6. Actos 15 mg q.d.  7. Amaryl 4 mg q.d.  8. Prednisone taper from 30 mg to quit over six days.  9. Xanax 0.5 mg #100 with one refill, take one q.6h. p.r.n. only if     really needed. 10. Prozac 20 mg p.o. q.d. 11. Temazepam 15 mg q.h.s. p.r.n. sleep. 12. Nexium 40 mg q.d. 13. Reglan 5 mg a.c. and h.s. 14. Zanaflex 4 mg q.8h. p.r.n. for back pain.  DIET:  An 1800 calorie, ADA, GERD diet.  FOLLOW-UP:  This is suggested with local mental health clinic.  Primary follow-up with Dr. Kirstie Peri with [facility] for diabetes management.  Follow-up as needed with Dr. Tasia Catchings for GI.  Follow-up with Clinton D. Maple Hudson, M.D., as scheduled and pending. DD:  09/27/00 TD:  09/28/00 Job: [id number]

## 2010-08-22 NOTE — Op Note (Signed)
NAME:  [name], [name]              ACCOUNT NO.:  [id number]   MEDICAL RECORD NO.:  [id number]          PATIENT TYPE:  AMB   LOCATION:  DAY                           FACILITY:  [facility]   PHYSICIAN:  R. Roetta Sessions, M.D. DATE OF BIRTH:  1950/10/24   DATE OF PROCEDURE:  01/16/2004  DATE OF DISCHARGE:                                 OPERATIVE REPORT   PROCEDURE:  Screening colonoscopy.   INDICATIONS:  The patient is a 60 year old lady who comes for screening  colonoscopy.  She has intermittent constipation, diarrhea consistent with  her prior diagnosis of irritable bowel syndrome.  No rectal bleeding.  She  states she may have had a colonoscopy many years ago.  No family history of  colorectal neoplasia.  Colonoscopy is now being done as a screening  maneuver.  This approach has been discussed with the patient.  The potential  risks, benefits and alternatives have been reviewed, questions answered and  she is agreeable.   DESCRIPTION OF PROCEDURE:  Oxygen saturation, blood pressure, pulse and  respiration were monitored throughout the entire procedure.  Conscious  sedation with IV Versed and Demerol in incremental doses.  The instrument  was the Olympus videochip system.   FINDINGS:  Digital rectal exam revealed no abnormalities.   ENDOSCOPIC FINDINGS:  Prep was good.  Rectum:  Examination of the rectal mucosa including retroflexion in the anal  verge revealed no abnormalities.  Colon:  Colonic mucosa was surveyed from the rectosigmoid junction through  the left, transverse, right colon to the area of the appendiceal orifice,  ileocecal valve and cecum. These structures were well seen and photographed  for the record.  From this level, the scope was slowly withdrawn.  All  previously mentioned mucosal surfaces were again seen.  The colonic mucosa  appeared normal.  The patient tolerated the procedure well and was reactive  after endoscopy.   IMPRESSION:  1.  Normal rectum.  2.   Normal colon.   RECOMMENDATIONS:  Repeat screening colonoscopy in 10 years.     Otelia Sergeant   RMR/MEDQ  D:  01/16/2004  T:  01/16/2004  Job:  [id number]

## 2010-08-22 NOTE — Discharge Summary (Signed)
[place]  Patient:    [name], [name]                     MRN: [id number] Adm. Date:  [id number] Disc. Date: [id number] Attending:  Young, Copy CC:         Clinton D. Maple Hudson, M.D.             Dr. Elwin Sleight, [facility], [place], [place]                           Discharge Summary  DISCHARGE DIAGNOSES: 1. Asthmatic bronchitis with acute exacerbation. 2. Allergic rhinitis. 3. Depression. 4. Essential tremor. 5. Diabetes mellitus type 2, non-insulin dependent.  HISTORY OF PRESENT ILLNESS:  This is a 60 year old white female with chronic asthma and a remote smoking history who was admitted in an effort to break a pattern of repeated flares of wheezing at home on aggressive medical therapy including several courses of oral steroids.  She had had several hospital visits especially to [facility] in the last year or so and had had bronchoscopy at [facility] described as showing mucous plugs consistent with asthma.  There had been question of esophageal reflux because a common pattern with her flares had been an association with meals and eating. Reportedly an upper endoscopy at [facility] had shown some inflammation. Aspirin sensitivity has not been identified.  Atopy has been demonstrated in the past with allergy skin testing done many years ago at which time she was on allergy vaccine for allergic rhinitis.  Calls to the office, unscheduled office visits both reported attacks of wheezing confirmed by her husband. They had become progressively more frequent in the last few weeks.  Outpatient therapy with Serevent, Flovent 220, home nebulizer with Atrovent and albuterol, Uniphyl and prednisone taper therapy had not broke this pattern. She was admitted in an effort to stabilize and find a better treatment regimen.  PAST MEDICAL HISTORY:  Depression.  Marked essential tremor. Non-insulin-dependent diabetes mellitus.  No history of  heart disease, deep venous thrombosis or pulmonary embolism.  PHYSICAL EXAMINATION:  LUNGS:  Prolonged expiratory phase and mild grunting/autopeep with minimal cough and no overt wheezing at the time she finally got a hospital bed assignment.  No stridor on exam, although this was questioned initially.  HEART:  Heart sounds were normal.  Please see dictated History and Physical for details.  HOSPITAL COURSE:  She was treated with intravenous Solu-Medrol and regularly scheduled nebulized bronchodilators.  Her diabetes was managed with oral agents and sliding scale insulin was available.  She received diabetes teaching and met with a diabetes coordinator.  Pulmonary function testing, were obtained as speech therapy evaluated modified barium swallow seeking evidence for aspiration and reflux.  These are described below under laboratory findings.  She called for active nebulizer treatments when needed and was able to abort one or two episodes in the hospital.  She was felt to have reached maximum hospital benefit with nothing more that we could learn at this point.  Another detailed discussion of potential triggers including ways to manage stress, environmental controls and proper use of medications were all reviewed.  She was discharged having reached maximum hospital benefit and will return to the office for followup.  LABORATORY DATA AND X-RAY FINDINGS:  EKG showed sinus rhythm with sinus arrhythmia.  Chest x-ray showed clear lung size, normal heart and normal pulmonary vasculature with minimal mid thoracic  spine degenerative changes. Pulmonary function testing showed normal spirometry after bronchodilator and being on Solu-Medrol without response to additional bronchodilator.  Measured total lung capacity and residual volume were normal.  Diffusion capacity was normal.  Dictated for barium swallow has not reached the chart, but speech therapist note indicated there was no risk for  aspiration and that regular diet was acceptable.  No laryngeal penetration was seen.  Speech therapist obtained similar history of patient reporting sometimes coughing up food and respiratory distress after eating.  White blood count was 9200, normal platelet count, differential for eosinophil count was not reported.  Serum chemistry normal except for admission glucose at 230.  Albumin was 3.8.  Liver functions were normal.  Theophylline level was subtherapeutic at 5.8. Urinalysis was positive for glucose, negative for ketones and blood, negative for protein.  Sputum showed normal flora with rare gram-positive cocci in pairs.  DIET:  1800 calorie ADA diet.  SPECIAL INSTRUCTIONS:  Return to work on Monday, September 29, 1999.  FOLLOWUP:  Keep follow-up appointment with Dr. Maple Hudson scheduled on October 10, 1999.  Keep follow-up appointment scheduled with her primary physician. Diabetic followup with Dr. Elwin Sleight.  Specific issue of diabetes control, worsened on prednisone, was reviewed with Ms. Basaldua and her husband.  DISCHARGE MEDICATIONS: 1. Flovent 220 two puffs b.i.d. 2. Serevent two puffs b.i.d. 3. Home nebulizer with Atrovent and/or albuterol four times daily if needed,    or use albuterol/Atrovent meter dose inhaler four times daily as needed. 4. Prozac 20 mg q.d. 5. Amaryl 2 mg q.d. 6. Prevacid 30 mg q.d. 7. Uniphyl 400 mg q.d. 8. Prednisone six day taper from 30 mg daily using 10 mg tablets. 9. Clonazepam 0.5 mg #60 with one refill to take one b.i.d. p.r.n. for    anxiety. DD:  10/14/99 TD:  10/14/99 Job: [id number]

## 2010-08-22 NOTE — Assessment & Plan Note (Signed)
[facility]                             PULMONARY OFFICE NOTE   [name], [name]                     MRN:          [id number]  DATE:07/12/2006                            DOB:          May 31, 1950    PROBLEM:  1. Asthma.  2. Allergic rhinitis.  3. Anxiety.  4. Vocal cord dysfunction.  5. Essential tremor.  6. Esophageal reflux.  7. Diabetes.   HISTORY:  Three weeks of increasing head and chest congestion blamed on  pollen.  She finished a prednisone taper and is now on doxycycline with  4 days more to go.  Over the weekend, she went for a nebulizer treatment  and IV Solu-Medrol at [facility].  Nasal congestion has been  yellow and thick with no fever or sore throat, occasional ache posterior  to the left ear.   MEDICATION:  1. Metformin 1000 mg b.i.d.  2. Advair 500/50.  3. Lexapro 10 mg.  4. Benazepril.  5. Mysoline t.i.d.  6. Januvia.  7. Astelin nasal spray.  8. P.R.N. use of Ativan 0.5 mg.  9. Proventil rescue inhaler.  10.Albuterol by home nebulizer.  11.Phenergan with codeine.  12.Benadryl.   DRUG INTOLERANCE:  TYLOX.   OBJECTIVE:  VITAL SIGNS:  Weight 183 pounds.  Blood pressure 162/110,  pulse regular at 102, room air saturation of 97%.  HEENT:  There is moderate nasal congestion.  Ears are negative.  CHEST:  A congested cough, a little more wheezy on the right.  No  dullness.  She is not using accessory muscles.  There is no adenopathy  or stridor.   IMPRESSION:  Asthma exacerbation with bronchitis and rhinitis.   PLAN:  1. Depo-Medrol 80 mg IM.  2. Finish doxycycline.  3. She is given samples of Advair 500/50 and 250/50 that we had      available.  4. Phenergan with codeine 5 mL q.6h. p.r.n.  5. Schedule return in 6 months; earlier p.r.n.     Clinton D. Maple Hudson, MD, Tonny Bollman, FACP  Electronically Signed    CDY/MedQ  DD: 07/12/2006  DT: 07/13/2006  Job #: [id number]   cc:   Delaney Meigs, M.D.

## 2010-08-22 NOTE — Consult Note (Signed)
NAME:  [name], [name] NO.:  [id number]   MEDICAL RECORD NO.:  [id number]                   PATIENT TYPE:   LOCATION:                                       FACILITY:   PHYSICIAN:  R. Roetta Sessions, M.D.              DATE OF BIRTH:  10-20-1950   DATE OF CONSULTATION:  03/15/2002  DATE OF DISCHARGE:                                   CONSULTATION   PRIMARY CARE PHYSICIAN:  Dr. Sherryll Burger.   CHIEF COMPLAINT:  Problem swallowing.   HISTORY OF PRESENT ILLNESS:  The patient is a pleasant 60 year old Caucasian  female patient of Dr. Sherryll Burger who presents today at his request for further  evaluation of dysphagia.  She has had intermittent episodes of dysphagia  throughout the years requiring a couple of endoscopies with dilatation.  She  said that approximately one to two years ago she had an EGD by Dr. Sherin Quarry  for abdominal pain.  In November 2003 she was eating a steak at [employer]  and felt the steak lodge in her esophagus.  She had to go to the bathroom  and vomit it out as she could not get it down.  She has had no more  recurrences since then.  She has been very careful to chew her food well.  She has no dysphagia to pills or liquids.  She has typical heartburn  symptoms well controlled on Prevacid and antireflux measures.  She recently  elevated the head of her bed on bricks in order to help with her reflux.  She was on Advil and aspirin previously but was asked to stop this recently  by Dr. Sherryll Burger.  She has been hospital one time this year for asthma; this was  in March 2003.  She denies any nausea or vomiting, abdominal pain,  constipation, diarrhea, melena, or rectal bleeding.   CURRENT MEDICATIONS:  1. Glucophage 5/500 two tablets q.d.  2. Singulair 30 mg q.d.  3. Prevacid 30 mg q.d.  4. Flovent 220 two puffs b.i.d.  5. Atrovent p.r.n.  6. Cough syrup p.r.n.   ALLERGIES:  TYLOX; however, she states she can take codeine.   PAST MEDICAL HISTORY:  1. Type 2 diabetes mellitus.  2. Asthmatic bronchitis.  3. Gastroesophageal reflux disease.  4. History of depression.  5. Essential tremor.   PAST SURGICAL HISTORY:  She has had a couple of surgeries on her right ankle  - one for a ligament repair.  She has had three breast biopsies - two on the  right, one on the left - which were benign.  She has had a tonsillectomy,  hysterectomy, Bartholin cyst, cholecystectomy.   FAMILY HISTORY:  Negative for chronic GI illnesses or colorectal cancer.   SOCIAL HISTORY:  She is married.  She works for [employer].  She is a former x-  Engineer, structural.  No tobacco or alcohol use.   REVIEW OF SYSTEMS:  Please see HPI for GI.  GENERAL:  Weight has been  stable.  CARDIOPULMONARY:  Denies any chest pain.  She required  hospitalization once this year for her asthma.  GENITOURINARY:  Denies any  dysuria or urinary frequency or hematuria.   PHYSICAL EXAMINATION:  VITAL SIGNS:  Weight 197, blood pressure 158/88,  pulse 80.  GENERAL:  Pleasant, well-nourished, well-developed Caucasian female in no  acute distress.  SKIN:  Warm and dry, no jaundice.  HEENT:  Conjunctivae are pink, sclerae nonicteric.  Oropharyngeal mucosa  moist and pink.  No lesions, erythema, or exudate.  She has a fine tremor.  NECK:  No lymphadenopathy or thyromegaly or carotid bruits.  CHEST:  Lungs clear to auscultation.  CARDIAC:  Reveals regular rate and rhythm, normal S1, S2.  No murmurs, rubs,  or gallops.  ABDOMEN:  Positive bowel sounds, soft, nontender, nondistended.  No  organomegaly or masses.  EXTREMITIES:  No edema.   IMPRESSION:  The patient is a pleasant 60 year old lady who has chronic  gastroesophageal reflux disease.  She recently developed a food impaction  which she was able to dislodge on her own.  I suspect that she has an  esophageal stricture or ring.  I discussed EGD with dilatation as far as  risks, alternative, and benefits and the patient is agreeable to  proceed.  She would like to postpone until after Christmas.  I discussed with her that  she needs to be careful with what foods she eats and make sure she chews  them very thoroughly.  If she develops any recurrent symptoms she should  proceed to the emergency department.   PLAN:  1. EGD with dilatation in the near future.  2. She will continue Prevacid 30 mg daily for now.  3. Discussed colonoscopy for colorectal cancer screening.  She will let us     know when she is ready to schedule.     Tana Coast, Pricilla Larsson, M.D.    LL/MEDQ  D:  03/15/2002  T:  03/15/2002  Job:  [id number]   cc:   Dr. Sherryll Burger

## 2010-08-22 NOTE — H&P (Signed)
NAME:  [name], [name]              ACCOUNT NO.:  [id number]   MEDICAL RECORD NO.:  [id number]          PATIENT TYPE:  INP   LOCATION:  5525                         FACILITY:  [facility]   PHYSICIAN:  Clinton D. Maple Hudson, MD, FCCP, FACPDATE OF BIRTH:  [birth year]   DATE OF ADMISSION:  07/19/2006  DATE OF DISCHARGE:                              HISTORY & PHYSICAL   ADMISSION DIAGNOSES:  1. Asthmatic bronchitis with exacerbation.  2. Vocal cord dysfunction syndrome.  3. Anxiety with depression.  4. Diabetes type 2, non-insulin dependent.  5. Essential tremor.  6. Esophageal reflux.   HISTORY:  A 60 year old white female former smoker admitted from the  office with a chief complaint of cough and wheeze.  History:  Over the  last 3-4 weeks she has had persistent wheezing dyspnea with cough.  She  was seen in the office April 7 at which time she gave a history of 3  weeks of increased chest congestion for which then she was taking a  prednisone taper and doxycycline.  She had [facility] to [facility]  emergency [facility] but was still experiencing active disruptive cough with  thick yellow sputum.  She returns today saying that 3 days ago she had  made another [facility] emergency trip at which time she had gotten  nebulizer treatments, cortisone and had a chest x-ray which was  normal.  She thinks she may have a sinusitis because of persistent  head congestion.  She is currently on a 5-day 50 mg per day prednisone  regimen and has taken Zithromax, Cipro and doxycycline.  She is also  taking Benadryl and Phenergan with codeine.  She blames the heat at work  as a possible aggravating factor.   HOME MEDICATIONS:  Advair 500/50, one puff b.i.d., Zyrtec 10 mg,  prednisone currently on 10 mg times five daily, benazepril 10 mg,  metformin 1000 mg b.i.d., glipizide ER 5 mg, Januvia 50 mg, Astelin one  or 2 sprays each nostril b.i.d.  p.r.n., Zocor 20 mg, Nexium 40 mg,  Mysoline 50 mg t.i.d. for essential  tremor, albuterol 2 puffs rescue  inhaler q. 4 hours p.r.n., Benadryl 50 mg at bedtime p.r.n., Phenergan  with Codeine cough syrup q. 6 h p.r.n., home nebulizer machine with  DuoNeb used q.i.d. p.r.n.   ALLERGIES:  Drug intolerance of TYLOX.   REVIEW OF SYSTEMS:  Head congestion, thick yellow nasal discharge,  postnasal drainage.  Hard disruptive coughing which is preventing sleep.  Wheeze.  No fever or chills, blood, grossly purulent discharge, chest  pain, palpitation or ankle edema.  No recent rash.   PAST HISTORY:  Asthma and bronchitis with multiple hospitalizations and  emergency room visits going back to the 1980s of childhood asthma.  Bronchoscopy showing mucus plugs and bronchitis.  Upper endoscopy  demonstrating mild esophagitis.  Diabetes, non-insulin-dependent type 2.  Anxiety and depression with vocal cord dysfunction, essential tremor,  esophageal reflux, allergic rhinitis with positive allergy skin testing  in the past.   PAST SURGICAL HISTORY:  C-section.  Resection of a cyst on her leg.  Bilateral benign breast cyst,  cholecystectomy, partial hysterectomy,  Bartholin cyst, two ligament repairs of the left ankle, ganglion on  wrist, tonsillectomy.   SOCIAL HISTORY:  Married.  Quit smoking many years ago.  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  Husband also  works at a [employer].   FAMILY HISTORY:  Grandfather died of lung cancer.  Father died of lung  cancer.  Mother diabetic.  One sister diabetic.  One daughter alive and  well.  A son died in infancy of brain tumor.  Identified triggers:  Viral syndrome, stress,  fatigue, smoke exposure and seasonal rhinitis  with pollen exposure.   OBJECTIVE:  VITAL SIGNS:  Weight 185 pounds, BP 160/120, pulse 117, room  air saturation 97%.  GENERAL APPEARANCE:  Overweight, tearful woman who became much happier  very quickly when I agreed to admit her.  SKIN:  No rash.   Adenopathy:  None at neck, shoulders or axillae.  HEENT:  Oral mucosa clear.  Nasal airway unobstructed.  Conjunctivae  clear.  NECK:  Neck veins not distended.  No stridor or thyromegaly.  CHEST:  Harsh cough, bilateral wheeze, scattered rhonchi.  No dullness.  Work of breathing not labored during quiet conversation.  HEART:  Heart sounds regular without murmur or gallop.  ABDOMEN:  Soft, nontender without hepatosplenomegaly.  BREASTS, PELVIC AND RECTAL:  Not done at this time.  EXTREMITIES:  No cyanosis, clubbing or edema.  Chronic resting tremor.   IMPRESSION:  Sustained exacerbation of asthma and bronchitis.  Not clear  if the initial trigger was viral or allergic but with multiple emergency  room visits, courses of steroids and antibiotics with need to get this  pattern broken.   PLAN:  She is being admitted for intravenous steroids and antibiotics,  nebulized bronchodilators, recheck of labs and reassessment of available  strategies.      Clinton D. Maple Hudson, MD, Tonny Bollman, FACP  Electronically Signed     CDY/MEDQ  D:  07/19/2006  T:  07/20/2006  Job:  [id number]   cc:   Delaney Meigs, M.D.

## 2010-08-22 NOTE — Op Note (Signed)
NAME:  [name], [name]                        ACCOUNT NO.:  [id number]   MEDICAL RECORD NO.:  [id number]                   PATIENT TYPE:  AMB   LOCATION:  DAY                                  FACILITY:  [facility]   PHYSICIAN:  R. Roetta Sessions, M.D.              DATE OF BIRTH:  [birth year]   DATE OF PROCEDURE:  04/03/2002  DATE OF DISCHARGE:                                 OPERATIVE REPORT   PROCEDURE PERFORMED:  Esophagoscopy and Maloney dilation.   ENDOSCOPIST:  Jonathon Bellows, M.D.   INDICATIONS FOR PROCEDURE:  The patient is a 60 year old lady with  longstanding gastroesophageal reflux disease, intermittent  ____________  solids and clinically, she has had a food impaction on at least a couple of  occasions.  Reflux symptoms were again well controlled on Prevacid 30 mg  orally daily.  EG is now being done to further evaluate her dysphagia.  The  approach has been discussed with the patient previously and again the  benefits and potential risks, benefits and alternatives have been reviewed,  questions answered and she has agreed.   DESCRIPTION OF PROCEDURE:  Oxygen saturations, blood pressure, pulse and  respirations were monitored throughout the entire procedure.   CONSCIOUS SEDATION:  Versed 4 mg IV, Demerol 75 mg IV in divided doses.   INSTRUMENT:  Olympus video chip adult gastroscope.   FINDINGS:  Examination of the tubular esophagus revealed a Schatski's ring.  The esophageal mucosa otherwise appeared normal.  EG junction was traversed.   STOMACH:  Gastric cavity was empty and insufflated well with air.  Thorough  examination of the gastric mucosa including retroflex view of the proximal  stomach, esophagogastric junction demonstrated no abnormalities.  The  pylorus was patent and easily traversed.   DUODENUM:  The duodenum, the bulb and second portion appeared normal.   THERAPY/DIAGNOSTIC MANEUVERS PERFORMED:  A 56 French Maloney dilator was  passed ____________ with  ease. There was no blood return on dilator.  Look-  back revealed the ring had been ruptured with a mucosal tear across the ring  at the 3 o'clock position ___________.  There were no apparent complications  related to passage of the dilator.  The patient tolerated the procedure well  and was reacted in the endoscopy unit.   IMPRESSION:  1. Schatski's ring, otherwise normal esophagus, status post dilation as     described above.  2. Normal stomach.  3. Normal D1, D2.    RECOMMENDATIONS:  1. Advance diet slowly today beginning with a clear liquid lunch.  2. Continue Prevacid 30 mg orally daily.  3. Follow-up with Dr. Sherryll Burger as needed.                                               Jonathon Bellows,  M.D.    RMR/MEDQ  D:  04/03/2002  T:  04/03/2002  Job:  [id number]   cc:   Dr. Sherryll Burger, [facility]

## 2010-08-22 NOTE — Assessment & Plan Note (Signed)
[facility]                             PULMONARY OFFICE NOTE   Elizabeth, [name]                     MRN:          [id number]  DATE:04/07/2006                            DOB:          [birth year]    PROBLEM:  1. Asthma.  2. Allergic rhinitis.  3. Anxiety.  4. Vocal chord dysfunction.  5. Essential tremor.  6. Esophageal reflux.  7. Diabetes.   HISTORY:  A 6 month followup.  She says she has been doing quite well.  She is using her Advair 500/50 only on a p.r.n. basis now, and has been  controlling postnasal drainage and its tendency to cause cough with  either Benadryl or Coricidin HBP.  She continues to work at [employer].  There have been no recent flare ups, although she has had some episodes  of asthma over the year.  She did get her flu shot.  She asks refills of  cough syrup and Ativan.  She will get her next mammogram with regular  physical exam at Dr. Joyce Copa later, but asks that I check her while she  is here.   MEDICATION:  1. Metformin 1000 mg b.i.d.  2. Advair 500/50 being used only intermittently now.  3. Lexapro 10 mg.  4. Benazepril.  5. Coricidin or Benadryl.  6. Ativan 0.5 mg b.i.d. p.r.n.  7. Albuterol inhaler.  8. Albuterol by nebulizer p.r.n.  9. Phenergan with codeine cough syrup.   DRUG INTOLERANCE:  DRUG INTOLERANT OF TYLOX.   OBJECTIVE:  Weight 185 pounds.  BP 128/92, pulse regular 97, room air  saturation 98%.  Nasal airway is clear, there is no stridor, no neck vein distension.  Her chest is quiet and clear today without cough or wheeze.  BREASTS:  Without dominant mass or discharge.  I do not feel enlargement of liver or spleen.  HEART:  Sounds are regular without murmur.  She has her usual slight tremulous quality to voice and a minimal  resting tremor in the hands, but overall looks quite comfortable.   IMPRESSION:  1. Chronic asthma with a very significant component of anxiety and      stress  related vocal chord dysfunction.  Now under much better      control.  2. We have also assumed that reflux was a triggering factor, and this      seems to be under control.   PLAN:  We discussed p.r.n. use of Advair, but agreed that she was doing  well and we would leave her to manage this as she has been.  Phenergan  with codeine 300 ml 5 ml q.6 h.prn, and Ativan 0.5 mg #30 one b.i.d.  p.r.n. anxiety were refilled.  Scheduled return 6 months, earlier p.r.n.     Clinton D. Maple Hudson, MD, Tonny Bollman, FACP  Electronically Signed    CDY/MedQ  DD: 04/07/2006  DT: 04/07/2006  Job #: [id number]   cc:   Delaney Meigs, M.D.

## 2010-12-17 ENCOUNTER — Encounter: Payer: Self-pay | Admitting: Internal Medicine

## 2010-12-17 ENCOUNTER — Ambulatory Visit (INDEPENDENT_AMBULATORY_CARE_PROVIDER_SITE_OTHER): Payer: Self-pay | Admitting: Internal Medicine

## 2010-12-17 VITALS — BP 122/88 | HR 102 | Ht 64.0 in | Wt 216.0 lb

## 2010-12-17 DIAGNOSIS — K219 Gastro-esophageal reflux disease without esophagitis: Secondary | ICD-10-CM

## 2010-12-17 DIAGNOSIS — J309 Allergic rhinitis, unspecified: Secondary | ICD-10-CM

## 2010-12-17 DIAGNOSIS — Z23 Encounter for immunization: Secondary | ICD-10-CM

## 2010-12-17 DIAGNOSIS — J45909 Unspecified asthma, uncomplicated: Secondary | ICD-10-CM

## 2010-12-17 NOTE — Assessment & Plan Note (Signed)
Minor seasonal flare- controlled

## 2010-12-17 NOTE — Patient Instructions (Signed)
Flu vax  Be carefull to sit upright enough for long enough after you eat so as to prevent reflux  Try an otc antihistamine like allegra/ fexofenadine for any allergy drainage- see if it helps the hoarseness.

## 2010-12-17 NOTE — Assessment & Plan Note (Addendum)
I think she has sufficient meds. Her observation is about a recent increase in symptoms. She will make more effort to stay upright for awhile after meals and pay attention to reflux precautions.  Flu vax discussed.

## 2010-12-17 NOTE — Progress Notes (Signed)
Subjective:    Patient ID: [name], female    DOB: 1950/10/07, 60 y.o.   MRN: [id number]  HPI 12/17/10- 85 yoF former smoker followed for allergic rhinitis, asthma, complicated by anxiety, GERD, DM, tachycardia, tremor, HBP Last here June 16, 2010 More wheeze in last 5 days especially after eating when she sits partly back in a recliner. Proventil rescue helps. Has little need for her nebulizer and continues bid Advair.  No overt reflux and not waking at night with cough or choke. . Some hoarseness and sinus drainage. 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    Review of Systems Constitutional:   No-   weight loss, night sweats, fevers, chills, fatigue, lassitude. HEENT:   No-  headaches, difficulty swallowing, tooth/dental problems, sore throat,       No-  sneezing, itching, ear ache, nasal congestion, post nasal drip,  CV:  No-   chest pain, orthopnea, PND, swelling in lower extremities, anasarca,                                  dizziness, palpitations Resp: No-   shortness of breath with exertion or at rest.              No-   productive cough,  No non-productive cough,  No-  coughing up of blood.              No-   change in color of mucus.  + wheezing.   Skin: No-   rash or lesions. GI:  No-   heartburn, indigestion, abdominal pain, nausea, vomiting, diarrhea,                 change in bowel habits, loss of appetite GU: No-   dysuria, change in color of urine, no urgency or frequency.  No- flank pain. MS:  No-   joint pain or swelling.  No- decreased range of motion.  No- back pain. Neuro-  Psych:  No- change in mood or affect. No depression or anxiety.  No memory loss. She    Objective:   Physical Exam General- Alert, Oriented, Affect-mildly anxious, Distress- none acute    overweight Skin- rash-none, lesions- none, excoriation- none Lymphadenopathy- none Head- atraumatic            Eyes- Gross vision intact, PERRLA, conjunctivae clear secretions           Ears- Hearing, canals normal            Nose- Clear, No-Septal dev, mucus, polyps, erosion, perforation             Throat- Mallampati II , mucosa clear , drainage- none, tonsils- atrophic Neck- flexible , trachea midline, no stridor , thyroid nl, carotid no bruit Chest - symmetrical excursion , unlabored           Heart/CV- RRR , no murmur , no gallop  , no rub, nl s1 s2                           - JVD- none , edema- none, stasis changes- none, varices- none           Lung- clear to P&A, wheeze- minor- at right scapula, cough- none , dullness-none, rub- none           Chest wall-  Abd- tender-no, distended-no, bowel sounds-present, HSM- no Br/ Gen/  Rectal- Not done, not indicated Extrem- cyanosis- none, clubbing, none, atrophy- none, strength- nl Neuro- grossly intact to observation except for tremor/ head bob and mild spastic dysphonia.         Assessment & Plan:

## 2010-12-21 NOTE — Assessment & Plan Note (Signed)
GERD precautions reeducated

## 2011-01-01 LAB — POCT I-STAT, CHEM 8
BUN: 6
Calcium, Ion: 1.17
Chloride: 104
Creatinine, Ser: 0.7
Glucose, Bld: 138 — ABNORMAL HIGH
HCT: 41
Hemoglobin: 13.9
Potassium: 4
Sodium: 139
TCO2: 24

## 2011-01-06 LAB — COMPREHENSIVE METABOLIC PANEL
ALT: 57 — ABNORMAL HIGH
AST: 44 — ABNORMAL HIGH
Albumin: 4.2
Alkaline Phosphatase: 80
BUN: 7
CO2: 27
Calcium: 9.9
Chloride: 106
Creatinine, Ser: 0.74
GFR calc Af Amer: >60
GFR calc non Af Amer: >60
Glucose, Bld: 245 — ABNORMAL HIGH
Potassium: 4.3
Sodium: 141
Total Bilirubin: 0.4
Total Protein: 7

## 2011-01-06 LAB — CBC
HCT: 41.4
Hemoglobin: 14
MCHC: 33.9
MCV: 90.6
Platelets: 259
RBC: 4.57
RDW: 12.8
WBC: 8.5

## 2011-01-06 LAB — CARDIAC PANEL(CRET KIN+CKTOT+MB+TROPI)
CK, MB: 1.6
CK, MB: 1.8
Relative Index: INVALID
Relative Index: INVALID
Total CK: 66
Total CK: 66
Troponin I: 0.03
Troponin I: 0.04

## 2011-01-06 LAB — GLUCOSE, CAPILLARY
Glucose-Capillary: 111 — ABNORMAL HIGH
Glucose-Capillary: 163 — ABNORMAL HIGH
Glucose-Capillary: 189 — ABNORMAL HIGH
Glucose-Capillary: 198 — ABNORMAL HIGH
Glucose-Capillary: 229 — ABNORMAL HIGH
Glucose-Capillary: 247 — ABNORMAL HIGH
Glucose-Capillary: 263 — ABNORMAL HIGH
Glucose-Capillary: 273 — ABNORMAL HIGH

## 2011-01-06 LAB — TSH: TSH: 6.433 — ABNORMAL HIGH

## 2011-01-06 LAB — T3, FREE: T3, Free: 2.6 (ref 2.3–4.2)

## 2011-01-06 LAB — T4, FREE: Free T4: 0.86 — ABNORMAL LOW

## 2011-01-06 LAB — PROTIME-INR
INR: 1
Prothrombin Time: 13.5

## 2011-01-06 LAB — APTT: aPTT: 28

## 2011-02-12 ENCOUNTER — Encounter: Payer: Self-pay | Admitting: Cardiology

## 2011-02-12 ENCOUNTER — Ambulatory Visit (INDEPENDENT_AMBULATORY_CARE_PROVIDER_SITE_OTHER): Payer: Self-pay | Admitting: Cardiology

## 2011-02-12 DIAGNOSIS — I498 Other specified cardiac arrhythmias: Secondary | ICD-10-CM

## 2011-02-12 DIAGNOSIS — E785 Hyperlipidemia, unspecified: Secondary | ICD-10-CM

## 2011-02-12 DIAGNOSIS — E119 Type 2 diabetes mellitus without complications: Secondary | ICD-10-CM

## 2011-02-12 DIAGNOSIS — I10 Essential (primary) hypertension: Secondary | ICD-10-CM

## 2011-02-12 NOTE — Assessment & Plan Note (Signed)
Blood pressure stable continue current medical therapy

## 2011-02-12 NOTE — Patient Instructions (Signed)
Continue all current medications. Your physician wants you to follow up in: 6 months.  You will receive a reminder letter in the mail one-two months in advance.  If you don't receive a letter, please call our office to schedule the follow up appointment   

## 2011-02-12 NOTE — Assessment & Plan Note (Signed)
Lipid panel and LFTs will also be obtained by the health department

## 2011-02-12 NOTE — Progress Notes (Signed)
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 She has had no recurrent palpitations. She is doing well from a cardiac perspective. She denies any chest pain. She has no shortness of breath orthopnea PND. She reports that her blood sugars been poorly controlled. She also is due for a lipid panel LFTs but she wants to have this done at the health department.  PMH: reviewed and listed in Problem List in Electronic Records (and see below)  Allergies/SH/FHX : available in Electronic Records for review  Medications: Current Outpatient Prescriptions  Medication Sig Dispense Refill  . albuterol (PROVENTIL) (2.5 MG/3ML) 0.083% nebulizer solution Take 2.5 mg by nebulization every 6 (six) hours as needed.        Marland Kitchen albuterol (PROVENTIL) 90 MCG/ACT inhaler Inhale 2 puffs into the lungs every 6 (six) hours as needed.        . Antipyrine-Benzocaine-Polycos (OTIC EDGE) 5.4-1.4-0.0097 % SOLN 1-2 drops to ear three times daily as needed       . aspirin 325 MG tablet Take 325 mg by mouth daily.        . cyclobenzaprine (FLEXERIL) 10 MG tablet Take one by mouth twice daily as needed       . Fluticasone-Salmeterol (ADVAIR DISKUS) 250-50 MCG/DOSE AEPB Inhale 1 puff into the lungs every 12 (twelve) hours.        . gabapentin (NEURONTIN) 300 MG capsule Take 900 mg by mouth at bedtime.        Marland Kitchen HYDROcodone-acetaminophen (VICODIN) 5-500 MG per tablet Take 1-2 tablets by mouth every 4 (four) hours as needed.        Marland Kitchen ibuprofen (ADVIL,MOTRIN) 800 MG tablet Take 800 mg by mouth every 8 (eight) hours as needed.        . insulin glargine (LANTUS) 100 UNIT/ML injection Use as directed        . insulin lispro (HUMALOG) 100 UNIT/ML injection Use as directed        . levothyroxine (SYNTHROID, LEVOTHROID) 25 MCG tablet Take 25 mcg by mouth daily.        . metFORMIN  (GLUCOPHAGE) 1000 MG tablet Take 1,000 mg by mouth 2 (two) times daily with a meal.        . metoprolol (TOPROL-XL) 50 MG 24 hr tablet Take one by mouth twice daily        . olmesartan-hydrochlorothiazide (BENICAR HCT) 40-25 MG per tablet Take 1 tablet by mouth daily.        . Omega-3 Fatty Acids (FISH OIL) 1000 MG CAPS Take one by mouth daily        . sertraline (ZOLOFT) 100 MG tablet Take 100 mg by mouth daily.          ROS: No nausea or vomiting. No fever or chills.No melena or hematochezia.No bleeding.No claudication  Physical Exam: BP 109/72  Pulse 98  Ht 5\' 4"  (1.626 m)  Wt 218 lb (98.884 kg)  BMI 37.42 kg/m2 General: Overweight white female in no distress Neck: Normal carotid upstroke. No carotid bruits. JVP is 5 cm. No thyromegaly Lungs: Clear breath sounds bilaterally. No wheezing Cardiac: Regular rate and rhythm with normal S1-S2 no murmur rubs or gallops Vascular: No edema. Normal dorsalis pedis and posterior tibial pulses Skin: Warm and dry  12lead ECG: Limited bedside ECHO:N/A   Assessment and Plan

## 2011-02-12 NOTE — Assessment & Plan Note (Signed)
No recurrent palpitations. Stable.

## 2011-02-12 NOTE — Assessment & Plan Note (Signed)
According to the patient poorly controlled but she will followup with the health department.

## 2011-04-13 ENCOUNTER — Telehealth: Payer: Self-pay | Admitting: Internal Medicine

## 2011-04-13 MED ORDER — PROMETHAZINE-CODEINE 6.25-10 MG/5ML PO SYRP: 5.0000 mL | ORAL_SOLUTION | Freq: Four times a day (QID) | ORAL | Status: DC | PRN

## 2011-04-13 NOTE — Telephone Encounter (Signed)
I spoke with pt and made her aware of CDY recs. She was fine w/ the cough syrup being called in. I have called this into [facility] in [place] per pt request. Nothing further was needed

## 2011-04-13 NOTE — Telephone Encounter (Signed)
I spoke with pt and she c/o cough w/ light yellow phlem, wheezing, chest tx, loss of appetite, left ear pain. Runny nose, PND, nasal congestion x 2 weeks. Pt denies any fever, chills, sweats, nausea, vomiting. Pt was seen by [facility] 1 week for bronchitis. Pt was given Levaquin IV and a  pred taper. Pt is currently on pred 20 mg QD and is taking equate tussin DM per bottle. Pt states she has had a zpak and bactrim as well w/o relief. Pt is requesting further recs from Dr. Maple Hudson. Please advise thanks  Allergies  Allergen Reactions  . Guaifenesin     REACTION: asthma attacks  . Oxycodone-Acetaminophen   . Mucinex     [facility] [place]

## 2011-04-13 NOTE — Telephone Encounter (Signed)
Per CY-okay to give Phenergan with codeine # 200 ml take 1 tsp every 6 hours as needed for cough no refills.

## 2011-04-16 ENCOUNTER — Telehealth: Payer: Self-pay | Admitting: Internal Medicine

## 2011-04-16 NOTE — Telephone Encounter (Signed)
I spoke with pt and she states she wanted to be seen either today or tomorrow for cough and wheezing. Pt states it's not getting any better. Since cdy had an opening tomorrow at 3:45 pt states she would like to come in at that time.

## 2011-04-17 ENCOUNTER — Encounter: Payer: Self-pay | Admitting: Internal Medicine

## 2011-04-17 ENCOUNTER — Ambulatory Visit (INDEPENDENT_AMBULATORY_CARE_PROVIDER_SITE_OTHER): Payer: Self-pay | Admitting: Internal Medicine

## 2011-04-17 VITALS — BP 112/74 | HR 105 | Ht 64.0 in | Wt 212.4 lb

## 2011-04-17 DIAGNOSIS — J45909 Unspecified asthma, uncomplicated: Secondary | ICD-10-CM

## 2011-04-17 DIAGNOSIS — E119 Type 2 diabetes mellitus without complications: Secondary | ICD-10-CM

## 2011-04-17 MED ORDER — BENZONATATE 200 MG PO CAPS: 200.0000 mg | ORAL_CAPSULE | Freq: Three times a day (TID) | ORAL | Status: AC | PRN

## 2011-04-17 MED ORDER — IPRATROPIUM-ALBUTEROL 0.5-2.5 (3) MG/3ML IN SOLN: 3.0000 mL | RESPIRATORY_TRACT | Status: DC | PRN

## 2011-04-17 MED ORDER — LEVALBUTEROL HCL 0.63 MG/3ML IN NEBU
0.6300 mg | INHALATION_SOLUTION | Freq: Once | RESPIRATORY_TRACT | Status: AC
  Administered 2011-04-17: 0.63 mg via RESPIRATORY_TRACT

## 2011-04-17 MED ORDER — METHYLPREDNISOLONE ACETATE 80 MG/ML IJ SUSP
80.0000 mg | Freq: Once | INTRAMUSCULAR | Status: AC
  Administered 2011-04-17: 80 mg via INTRAMUSCULAR

## 2011-04-17 NOTE — Progress Notes (Signed)
Patient ID: [name], female    DOB: [birth year], 61 y.o.   MRN: [id number]  HPI 12/17/10- 61 yoF former smoker followed for allergic rhinitis, asthma, complicated by anxiety, GERD, DM, tachycardia, tremor, HBP Last here June 16, 2010 More wheeze in last 5 days especially after eating when she sits partly back in a recliner. Proventil rescue helps. Has little need for her nebulizer and continues bid Advair.  No overt reflux and not waking at night with cough or choke. . Some hoarseness and sinus drainage. 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   04/17/11- 61 yoF former smoker followed for allergic rhinitis, asthma, complicated by anxiety, GERD, DM, tachycardia, tremor, HBP Has had flu vaccine. Hospitalized briefly at [facility] around January 3 for exacerbation of COPD with acute bronchitis and asthma, uncontrolled diabetes type 2, HBP and peripheral neuropathy. She had been fighting an exacerbation of asthmatic bronchitis since around December 21. Treated with Bactrim then Zithromax and Levaquin. She may be a little worse now than she was at the time of discharge, based on persistent cough with light yellow sputum mostly in the mornings. She ends her prednisone taper as of tomorrow. Has cough syrup. Low-grade fever 99 4. Now denies sore throat, chest pain, nodes, GI upset. Glucose was elevated on steroids. She manages her own insulin, supervised by the health department. She is retired from [employer]. Living with husband.  Review of Systems-see HPIConstitutional:   No-   weight loss, night sweats, fevers, chills, fatigue, lassitude. HEENT:   No-  headaches, difficulty swallowing, tooth/dental problems, sore throat,       No-  sneezing, itching, ear ache, nasal congestion, post nasal drip,  CV:  No-   chest pain, orthopnea, PND, swelling in lower extremities, anasarca, dizziness, palpitations Resp: + shortness of breath with exertion or at rest.              No-    productive cough,  N+ non-productive cough,  No-  coughing up of blood.              No-   change in color of mucus.  + wheezing.   Skin: No-   rash or lesions. GI:  No-   heartburn, indigestion, abdominal pain, nausea, vomiting, diarrhea,                 change in bowel habits, loss of appetite GU: MS:  No-   joint pain or swelling.  No- decreased range of motion.  No- back pain. Neuro-  Psych:  No- change in mood or affect. No depression or anxiety.  No memory loss.     Objective:   Physical Exam General- Alert, Oriented, Affect-mildly anxious, Distress- none acute    overweight Skin- rash-none, lesions- none, excoriation- none Lymphadenopathy- none Head- atraumatic            Eyes- Gross vision intact, PERRLA, conjunctivae clear secretions            Ears- Hearing, canals normal            Nose- Clear, No-Septal dev, mucus, polyps, erosion, perforation             Throat- Mallampati II , mucosa clear , drainage- none, tonsils- atrophic Neck- flexible , trachea midline, no stridor , thyroid nl, carotid no bruit Chest - symmetrical excursion , unlabored           Heart/CV- RRR , no murmur , no gallop  ,  no rub, nl s1 s2                           - JVD- none , edema- none, stasis changes- none, varices- none           Lung- wheezy cough exaggerated by forced end expiration with some upper airway pseudowheeze , dullness-none, rub- none           Chest wall-  Abd- tender-no, distended-no, bowel sounds-present, HSM- no Br/ Gen/ Rectal- Not done, not indicated Extrem- cyanosis- none, clubbing, none, atrophy- none, strength- nl Neuro- grossly intact to observation except for tremor/ head bob and mild spastic dysphonia.

## 2011-04-17 NOTE — Patient Instructions (Signed)
Scripts for benzonatate pearls for cough, and for Duoneb nebulizer solution  Depo 80       Kamila, please don't let your blood sugar get out of control  Neb Xop 0.63

## 2011-04-19 NOTE — Assessment & Plan Note (Signed)
Recent flare with hospitalization, almost certainly viral event at this time of year. She had had several antibiotics and is not describing purulent discharge. We discussed steroids and her diabetic control. Plan-continue adequate fluids, Mucinex, benzonatate Perles, Depo-Medrol. At her request we are providing DuoNeb nebulizer solution.

## 2011-04-19 NOTE — Assessment & Plan Note (Signed)
She describes glucose as high as 500 during treatment for her recent hospital illness. We discussed the impact of proposed Depo-Medrol today, and asked her to check back early with the health department for her diabetes management.

## 2011-04-20 ENCOUNTER — Telehealth: Payer: Self-pay | Admitting: Internal Medicine

## 2011-04-20 MED ORDER — DOXYCYCLINE HYCLATE 100 MG PO CAPS: ORAL_CAPSULE | ORAL | Status: DC

## 2011-04-20 NOTE — Telephone Encounter (Signed)
I spoke with pt and she c/o cough w/ yellow phlem, chest tightness, wheezing, nasal congestion, tamp of 100.0, chest congestion and chills. Pt denies any nausea, vomiting, body aches. She is taking phenergan w/ codeine cough syrup, benzonatate, and using her duoneb as directed. Pt is requesting further recs. Please advise Dr. Maple Hudson, thanks  Allergies  Allergen Reactions  . Guaifenesin     REACTION: asthma attacks  . Oxycodone-Acetaminophen   . Mucinex

## 2011-04-20 NOTE — Telephone Encounter (Signed)
Per CY-okay to give Doxycycline 100 mg #8 take 2 today then 1 daily until gone no refills.  

## 2011-04-20 NOTE — Telephone Encounter (Signed)
Patient aware of Rx for Doxycycline and will call if her sxs do not improve or seek emergency help if sxs get worse.

## 2011-04-23 ENCOUNTER — Telehealth: Payer: Self-pay | Admitting: Internal Medicine

## 2011-04-23 NOTE — Telephone Encounter (Signed)
I returned her call. She has had Zpak, then Bactrim, Levaquin. We discussed trying amoxacilin, then decided to wait. Let her finish doxy, use her neb and remaining cough syrup.  She will pick up sample Dulera 200 2 puffs then rinse, twice daily, to use instead of Advair. I note she was very conversational with unlabored breathing and no cough while talking with me on phone.

## 2011-04-23 NOTE — Telephone Encounter (Signed)
Pt saw CY for a sick visit on 04/17/11---prescribed tessalon perles, depo injection and xopenex neb tx.  Pt then called on 04/20/11 stating she wasn't feeling any better and was prescribed doxycycline.  Pt calls today stating she has 3 tabs left of the doxycycline.  Pt states she is still running a fever- most recently was 100.6.  Also c/o coughing up yellow sputum.  States the cough is "real deep and she will have to double over" to get the mucus up.  Pt states she continues to take the phenergan w/ codeine which helps the cough some but her cough is worse at night and is keeping her up.  Pt is still on her inhaler and doing neb tx every 4 hours.   Also c/o sore throat, hoarseness, and increased sob and tightness in her chest.  Pt states little nasal congestion/drainage.  States her peak flow reading most recently was 250 and on her "good days" she is 350-400. Pt is just wanting to give CY an update and if he had any recs for her.  CY, please advise.  Thanks! Allergies  Allergen Reactions  . Guaifenesin     REACTION: asthma attacks  . Oxycodone-Acetaminophen   . Mucinex

## 2011-04-28 ENCOUNTER — Telehealth: Payer: Self-pay | Admitting: Internal Medicine

## 2011-04-28 MED ORDER — PROMETHAZINE-CODEINE 6.25-10 MG/5ML PO SYRP: 5.0000 mL | ORAL_SOLUTION | Freq: Four times a day (QID) | ORAL | Status: DC | PRN

## 2011-04-28 NOTE — Telephone Encounter (Signed)
I spoke with pt and is aware rx for the cough syrup has been called in. Nothing further was needed. Rx was called into [facility] per pt request.

## 2011-04-28 NOTE — Telephone Encounter (Signed)
I spoke with pt and she is requesting refill on her phenergan w/ codeine. Pt states she is still coughing up yellow phlem. Denies any fever, nausea, vomiting. She states she is "just coughing". Please advise Dr. Maple Hudson, thanks  Allergies  Allergen Reactions  . Guaifenesin     REACTION: asthma attacks  . Oxycodone-Acetaminophen   . Mucinex      [facility] [place]

## 2011-04-28 NOTE — Telephone Encounter (Signed)
OK to refill cough syrup 

## 2011-04-29 ENCOUNTER — Telehealth: Payer: Self-pay | Admitting: Internal Medicine

## 2011-04-29 NOTE — Telephone Encounter (Signed)
I spoke with [facility] and was advised they did receive rx and it was ready for p/u. --lmomtcb x1 to make pt aware

## 2011-04-29 NOTE — Telephone Encounter (Signed)
I spoke with pt and made her aware and she states she has already picked it up. Nothing further was needed

## 2011-04-29 NOTE — Telephone Encounter (Signed)
Pt returning call can be reached at [phone].[name]

## 2011-05-04 ENCOUNTER — Telehealth: Payer: Self-pay | Admitting: Internal Medicine

## 2011-05-04 MED ORDER — PREDNISONE 10 MG PO TABS: 10.0000 mg | ORAL_TABLET | Freq: Every day | ORAL | Status: DC

## 2011-05-04 NOTE — Telephone Encounter (Signed)
Spoke with pt and advised that rx for prednisone was sent and she is aware to try gaviscon. CDY had called and spoke with her.

## 2011-05-04 NOTE — Telephone Encounter (Signed)
Spoke with pt. She states that her cough is still not improving. She was seen on 04/17/11 and given depo and neb tx, then we prescribed doxycycline for her on 04/20/11. She also called on 1/17 and CDY spoke with her and changed her from advair to [place]. Then called on 1/17 and we called in promethazine with codeine syrup. She states that none of this haas helped and has now coughed so much that her ribs and chest feels sore.She states that the cough is prod with minimal clear sputum. CDY has no openings. Please advise, thanks! Allergies  Allergen Reactions  . Guaifenesin     REACTION: asthma attacks  . Oxycodone-Acetaminophen   . Mucinex

## 2011-05-04 NOTE — Telephone Encounter (Signed)
Order prednisone 10 mg, # 15, 1 daily  Suggest Gaviscon liquid for heart burn.

## 2011-05-11 ENCOUNTER — Telehealth: Payer: Self-pay | Admitting: Internal Medicine

## 2011-05-11 ENCOUNTER — Encounter: Payer: Self-pay | Admitting: Internal Medicine

## 2011-05-11 ENCOUNTER — Ambulatory Visit (INDEPENDENT_AMBULATORY_CARE_PROVIDER_SITE_OTHER): Payer: Self-pay | Admitting: Internal Medicine

## 2011-05-11 VITALS — BP 108/74 | HR 101 | Ht 64.0 in | Wt 215.0 lb

## 2011-05-11 DIAGNOSIS — F458 Other somatoform disorders: Secondary | ICD-10-CM

## 2011-05-11 DIAGNOSIS — J441 Chronic obstructive pulmonary disease with (acute) exacerbation: Secondary | ICD-10-CM

## 2011-05-11 MED ORDER — AMOXICILLIN 500 MG PO CAPS: 500.0000 mg | ORAL_CAPSULE | Freq: Three times a day (TID) | ORAL | Status: AC

## 2011-05-11 MED ORDER — PROMETHAZINE-CODEINE 6.25-10 MG/5ML PO SYRP: 5.0000 mL | ORAL_SOLUTION | Freq: Four times a day (QID) | ORAL | Status: AC | PRN

## 2011-05-11 NOTE — Patient Instructions (Signed)
Scripts to hold for amoxacillin and cough syrup  Finish prednisone then stop  Suggest you take a regular acid blocker like Prilosec, omeprazole, famotidine  Once daily before meal.  Ok to take Gaviscon when needed
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
## 2011-05-11 NOTE — Telephone Encounter (Signed)
I spoke with the pt and she has had an ongoing cough x 1 month that is not improving. Appt set for CY today at 2pm. Carron Curie, CMA

## 2011-05-13 NOTE — Assessment & Plan Note (Signed)
Vocal cord dysfunction/anxiety component beyond any bronchospastic physiology.

## 2011-05-13 NOTE — Assessment & Plan Note (Signed)
Physical findings are not striking. There is a component of emotional stress with vocal cord dysfunction. Plan-finished prednisone and stop. Refilled cough syrup. Prescription for amoxicillin. Take acid blocker regularly before meal. Continue working with counselor

## 2011-06-17 ENCOUNTER — Ambulatory Visit: Payer: Self-pay | Admitting: Internal Medicine

## 2011-07-06 ENCOUNTER — Encounter: Payer: Self-pay | Admitting: Cardiology

## 2011-07-06 ENCOUNTER — Ambulatory Visit (INDEPENDENT_AMBULATORY_CARE_PROVIDER_SITE_OTHER): Payer: Self-pay | Admitting: Cardiology

## 2011-07-06 VITALS — BP 111/81 | HR 99 | Ht 64.5 in | Wt 198.5 lb

## 2011-07-06 DIAGNOSIS — R000 Tachycardia, unspecified: Secondary | ICD-10-CM

## 2011-07-06 DIAGNOSIS — I498 Other specified cardiac arrhythmias: Secondary | ICD-10-CM

## 2011-07-06 DIAGNOSIS — R0602 Shortness of breath: Secondary | ICD-10-CM

## 2011-07-06 DIAGNOSIS — I10 Essential (primary) hypertension: Secondary | ICD-10-CM

## 2011-07-06 MED ORDER — METOPROLOL SUCCINATE ER 50 MG PO TB24: ORAL_TABLET | ORAL | Status: DC

## 2011-07-06 NOTE — Patient Instructions (Signed)
   Increase Toprol XL to 75mg  daily  Echo If the results of your test are normal or stable, you will receive a letter.  If they are abnormal, the nurse will contact you by phone. Your physician wants you to follow up in: 6 months.  You will receive a reminder letter in the mail one-two months in advance.  If you don't receive a letter, please call our office to schedule the follow up appointment

## 2011-07-08 ENCOUNTER — Encounter: Payer: Self-pay | Admitting: Cardiology

## 2011-07-08 NOTE — Assessment & Plan Note (Signed)
Patient describes slow pathway ablation.  However she still has occasional palpitations which last 5-10 min.  We will increase Toprol-XL to 75 min. By mouth daily

## 2011-07-08 NOTE — Progress Notes (Signed)
Peyton Bottoms, MD, [facility] Board Certified in Adult Cardiovascular Medicine,Internal Medicine and Critical Care Medicine    CC: followup patient with history of AV nodal ablation and now with recurrent palpitations.  HPI:  The patient reports some recurrent palpitations.  There are no associated chest pain shortness of breath orthopnea or PND.  She also has not noticed any decline in her exercise tolerance.  Overall she's been doing well.  Early in the year she struggled with some bronchitis and some shortness of breath but was under the care of Dr. Maple Hudson.  She is doing better from this perspective.  Blood work is being done at the health department. Otherwise she reports no cardiovascular symptoms  PMH: reviewed and listed in Problem List in Electronic Records (and see below) Past Medical History  Diagnosis Date  . Hyperlipidemia   . Hypertension   . Diabetes mellitus   . Asthma   . 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 Vocal cord dysfunction   . Esophageal reflux   . Tremor, essential   . Anxiety   . Allergic rhinitis    Past Surgical History  Procedure Date  . Tonsillectomy   . Right breast cyst   . Left ankle ligament repair   . Left elbow repair   . Partial hysterectomy   . Cholecystectomy   . Cesarean section   . Ablation for avnrt     Allergies/SH/FHX : available in Electronic Records for review  Allergies  Allergen Reactions  . Guaifenesin     REACTION: asthma attacks  . Tylox   . Mucinex    History   Social History  . Marital Status: Married    Spouse Name: N/A    Number of Children: N/A  . Years of Education: N/A   Occupational History  . Not on file.   Social History Main Topics  . Smoking status: Former Smoker -- 0.3 packs/day for 10 years    Types: Cigarettes    Quit date: 04/06/1985  . Smokeless tobacco: Never Used  . Alcohol Use: No     very rarely have a drink  . Drug Use: No  .  Sexually Active: Not on file   Other Topics Concern  . 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 Lung cancer Father     DIED AGE 44 LUNG CA  . COPD Mother     DIED AGE 19,MRSA,COPD,PNEUMONIA  . Pneumonia Mother     DIED AGE 19,MRSA,COPD,PNEUMONIA  . COPD Brother     AGE 108  . Heart disease Sister     DIED AGE 33, SMOKER,?HEART    Medications: Current Outpatient Prescriptions  Medication Sig Dispense Refill  . albuterol (PROVENTIL) (2.5 MG/3ML) 0.083% nebulizer solution Take 2.5 mg by nebulization every 6 (six) hours as needed.        Marland Kitchen albuterol (PROVENTIL) 90 MCG/ACT inhaler Inhale 2 puffs into the lungs every 6 (six) hours as needed.        . Antipyrine-Benzocaine-Polycos (OTIC EDGE) 5.4-1.4-0.0097 % SOLN 1-2 drops to ear three times daily as needed       . aspirin 325 MG tablet Take 325 mg by mouth daily.       . cyclobenzaprine (FLEXERIL) 10 MG tablet  Take one by mouth twice daily as needed       . gabapentin (NEURONTIN) 300 MG capsule Take 900 mg by mouth at bedtime.        Marland Kitchen ibuprofen (ADVIL,MOTRIN) 800 MG tablet Take 800 mg by mouth every 8 (eight) hours as needed.        . insulin detemir (LEVEMIR) 100 UNIT/ML injection Inject 50 Units into the skin at bedtime.      . insulin lispro (HUMALOG) 100 UNIT/ML injection Inject 20 Units into the skin 3 (three) times daily before meals. Use as directed       . ipratropium-albuterol (DUONEB) 0.5-2.5 (3) MG/3ML SOLN Take 3 mLs by nebulization every 4 (four) hours as needed.  25 mL  prn  . levothyroxine (SYNTHROID, LEVOTHROID) 25 MCG tablet Take 50 mcg by mouth daily.       . metFORMIN (GLUCOPHAGE) 1000 MG tablet Take 1,000 mg by mouth 2 (two) times daily with a meal.        . Mometasone Furo-Formoterol Fum (DULERA) 200-5 MCG/ACT AERO Inhale 1 puff into the lungs 2 (two)  times daily.      . Multiple Vitamin (MULTIVITAMIN) tablet Take 1 tablet by mouth daily.      Marland Kitchen olmesartan-hydrochlorothiazide (BENICAR HCT) 40-25 MG per tablet Take 1 tablet by mouth daily.        . Omega-3 Fatty Acids (FISH OIL) 1000 MG CAPS Take one by mouth daily        . sertraline (ZOLOFT) 100 MG tablet Take 100 mg by mouth daily.        . metoprolol succinate (TOPROL-XL) 50 MG 24 hr tablet Take 1 1/2 tabs (75mg ) by mouth daily  45 tablet  6    ROS: No nausea or vomiting. No fever or chills.No melena or hematochezia.No bleeding.No claudication  Physical Exam: BP 111/81  Pulse 99  Ht 5' 4.5" (1.638 m)  Wt 198 lb 8 oz (90.039 kg)  BMI 33.55 kg/m2 General:well-nourished white female in no distress. Neck:normal carotid upstrokes no carotid bruit Lungs:clear breath sounds bilaterally.no wheezing Cardiac:regular rate and rhythm normal S1-S2.  No murmurs rubs or gallops Vascular:no edema.  Normal distal pulses Skin:warm and dry Physcologic:normal affect  12lead ECG:not obtained Limited bedside ECHO:N/A No images are attached to the encounter.   Assessment and Plan  AV NODAL REENTRY TACHYCARDIA Patient describes slow pathway ablation.  However she still has occasional palpitations which last 5-10 min.  We will increase Toprol-XL to 75 min. By mouth daily  ESSENTIAL HYPERTENSION Patient will need followup echocardiogram to evaluate structural heart disease.  Recently she has complained of some shortness of breath, although this may be in the setting of bronchitis.    Patient Active Problem List  Diagnoses  . DIABETES MELLITUS  . HYPERLIPIDEMIA-MIXED  . ANXIETY  . DEPRESSION  . TREMOR, ESSENTIAL  . ESSENTIAL HYPERTENSION  . AV NODAL REENTRY TACHYCARDIA  . ALLERGIC RHINITIS  . Chronic asthmatic bronchitis with acute exacerbation  . ESOPHAGEAL REFLUX  . PALPITATIONS  . THRUSH  . 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

## 2011-07-08 NOTE — Assessment & Plan Note (Signed)
Patient will need followup echocardiogram to evaluate structural heart disease.  Recently she has complained of some shortness of breath, although this may be in the setting of bronchitis.

## 2011-07-16 ENCOUNTER — Other Ambulatory Visit: Payer: Self-pay

## 2011-07-16 ENCOUNTER — Other Ambulatory Visit (INDEPENDENT_AMBULATORY_CARE_PROVIDER_SITE_OTHER): Payer: Self-pay

## 2011-07-16 DIAGNOSIS — R000 Tachycardia, unspecified: Secondary | ICD-10-CM

## 2011-07-16 DIAGNOSIS — R0602 Shortness of breath: Secondary | ICD-10-CM

## 2011-07-20 ENCOUNTER — Ambulatory Visit: Payer: Self-pay | Admitting: Internal Medicine

## 2011-07-20 ENCOUNTER — Encounter: Payer: Self-pay | Admitting: *Deleted

## 2011-10-05 ENCOUNTER — Telehealth: Payer: Self-pay | Admitting: Internal Medicine

## 2011-10-05 NOTE — Telephone Encounter (Signed)
A sample of dulera was left at front along with patient assistance forms. Carron Curie, CMA

## 2011-11-10 ENCOUNTER — Encounter: Payer: Self-pay | Admitting: Internal Medicine

## 2011-11-10 ENCOUNTER — Ambulatory Visit (INDEPENDENT_AMBULATORY_CARE_PROVIDER_SITE_OTHER): Payer: Self-pay | Admitting: Internal Medicine

## 2011-11-10 VITALS — BP 122/80 | HR 95 | Ht 64.0 in | Wt 215.6 lb

## 2011-11-10 DIAGNOSIS — B370 Candidal stomatitis: Secondary | ICD-10-CM

## 2011-11-10 DIAGNOSIS — J441 Chronic obstructive pulmonary disease with (acute) exacerbation: Secondary | ICD-10-CM

## 2011-11-10 DIAGNOSIS — J309 Allergic rhinitis, unspecified: Secondary | ICD-10-CM

## 2011-11-10 MED ORDER — PROMETHAZINE-CODEINE 6.25-10 MG/5ML PO SYRP
5.0000 mL | ORAL_SOLUTION | ORAL | Status: DC | PRN
Start: 2011-11-10 — End: 2012-03-10

## 2011-11-10 MED ORDER — OTIC EDGE 5.4-1.4-0.0097 % OT SOLN: OTIC | Status: DC

## 2011-11-10 NOTE — Patient Instructions (Addendum)
Scripts for ear drops and cough syrup  Sample Dulera 200 if available  Ok to run a dehumidifier in your home if it is too musty and mildewy  Please call as needed
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
## 2011-11-16 NOTE — Assessment & Plan Note (Signed)
Good control now Dulera. We discussed medication use.

## 2011-11-16 NOTE — Assessment & Plan Note (Signed)
Discussed mouth care been using steroid inhaler, made worse because she is diabetic. She may need a spacer tube. If she continues to remain under control, we can reduce the strength of her Dulera.

## 2011-11-16 NOTE — Assessment & Plan Note (Signed)
Ear discomfort maybe eustachian tube dysfunction. External canals and TMs look okay. Plan- Auralgan ear drops

## 2011-12-22 ENCOUNTER — Ambulatory Visit (INDEPENDENT_AMBULATORY_CARE_PROVIDER_SITE_OTHER): Payer: Self-pay | Admitting: Physician Assistant

## 2011-12-22 ENCOUNTER — Encounter: Payer: Self-pay | Admitting: Physician Assistant

## 2011-12-22 VITALS — BP 124/84 | HR 98 | Ht 64.5 in | Wt 211.0 lb

## 2011-12-22 DIAGNOSIS — E039 Hypothyroidism, unspecified: Secondary | ICD-10-CM

## 2011-12-22 DIAGNOSIS — I10 Essential (primary) hypertension: Secondary | ICD-10-CM

## 2011-12-22 DIAGNOSIS — E119 Type 2 diabetes mellitus without complications: Secondary | ICD-10-CM

## 2011-12-22 DIAGNOSIS — I498 Other specified cardiac arrhythmias: Secondary | ICD-10-CM

## 2011-12-22 NOTE — Assessment & Plan Note (Signed)
Continues to do well on current medication regimen, with decreased episodes of breakthrough palpitations following up titration of Toprol dose, at time of last OV. Reassess clinical status in 6 months, at which time she will establish with Dr. Diona Browner, here in our [place] clinic.

## 2011-12-22 NOTE — Assessment & Plan Note (Signed)
Followed by primary M.D. Would consider adding a cholesterol lowering agent, for primary prevention.

## 2011-12-22 NOTE — Assessment & Plan Note (Signed)
Followed by primary M.D. 

## 2011-12-22 NOTE — Progress Notes (Signed)
Primary Cardiologist: Simona Huh, MD (new)    HPI: Scheduled six-month followup.  When last seen in April, Dr. Andee Lineman ordered a followup echocardiogram to further evaluate complaint of dyspnea.   - 2-D echo: EF 65%, moderate LVH, normal wall motion, no significant valvular abnormalities  Additionally, he increased her Toprol to 75 mg daily, for treatment of occasional breakthrough palpitations.  Clinically, she reports continued success, since undergoing successful RF ablation in 2009. Since her last visit, she states that she has only had one breakthrough episode of tachycardia palpitations, essentially asymptomatic, lasting less than 5 minutes in duration.   Allergies  Allergen Reactions  . Guaifenesin     REACTION: asthma attacks  . Oxycodone-Acetaminophen   . Guaifenesin Er     Current Outpatient Prescriptions  Medication Sig Dispense Refill  . albuterol (PROVENTIL) (2.5 MG/3ML) 0.083% nebulizer solution Take 2.5 mg by nebulization every 6 (six) hours as needed.        . Antipyrine-Benzocaine-Polycos (OTIC EDGE) 5.4-1.4-0.0097 % SOLN 1-2 drops to ear three times daily as needed  1 Bottle  prn  . aspirin 81 MG tablet Take 81 mg by mouth daily.      . fexofenadine (ALLEGRA) 180 MG tablet Take 180 mg by mouth daily.      Marland Kitchen gabapentin (NEURONTIN) 300 MG capsule Take 600 mg by mouth at bedtime.       Marland Kitchen ibuprofen (ADVIL,MOTRIN) 800 MG tablet Take 800 mg by mouth every 8 (eight) hours as needed.        . insulin detemir (LEVEMIR) 100 UNIT/ML injection Inject 55 Units into the skin at bedtime.       . insulin lispro (HUMALOG) 100 UNIT/ML injection Inject 30 Units into the skin 3 (three) times daily before meals. Use as directed       . levothyroxine (SYNTHROID, LEVOTHROID) 25 MCG tablet Take 75 mcg by mouth daily.       . metFORMIN (GLUCOPHAGE) 1000 MG tablet Take 1,000 mg by mouth 2 (two) times daily with a meal.        . metoprolol succinate (TOPROL-XL) 50 MG 24 hr tablet Take 1  1/2 tabs (75mg ) by mouth daily  45 tablet  6  . Mometasone Furo-Formoterol Fum (DULERA) 200-5 MCG/ACT AERO Inhale 1 puff into the lungs 2 (two) times daily.      . Multiple Vitamin (MULTIVITAMIN) tablet Take 1 tablet by mouth daily.      Marland Kitchen olmesartan-hydrochlorothiazide (BENICAR HCT) 40-25 MG per tablet Take 1 tablet by mouth daily.        . Omega-3 Fatty Acids (FISH OIL) 1000 MG CAPS Take one by mouth daily        . promethazine-codeine (PHENERGAN WITH CODEINE) 6.25-10 MG/5ML syrup Take 5 mLs by mouth every 4 (four) hours as needed for cough.  180 mL  1  . sertraline (ZOLOFT) 100 MG tablet Take 100 mg by mouth daily.        . traMADol (ULTRAM) 50 MG tablet Take 50 mg by mouth every 4 (four) hours as needed.        Past Medical History  Diagnosis Date  . Hyperlipidemia   . Hypertension   . Diabetes mellitus   . Asthma   . 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 Hillis Range, residual palpitations  . Vocal cord dysfunction   . Esophageal reflux   . Tremor, essential   . Anxiety   . Allergic rhinitis  Past Surgical History  Procedure Date  . Tonsillectomy   . Right breast cyst   . Left ankle ligament repair   . Left elbow repair   . Partial hysterectomy   . Cholecystectomy   . Cesarean section   . Ablation for avnrt     History   Social History  . Marital Status: Married    Spouse Name: N/A    Number of Children: N/A  . Years of Education: N/A   Occupational History  . Not on file.   Social History Main Topics  . Smoking status: Former Smoker -- 0.3 packs/day for 10 years    Types: Cigarettes    Quit date: 04/06/1985  . Smokeless tobacco: Never Used  . Alcohol Use: No     very rarely have a drink  . Drug Use: No  . Sexually Active: Not on file   Other Topics Concern  . 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 Lung cancer Father     DIED AGE 59 LUNG CA  . COPD Mother     DIED AGE 68,MRSA,COPD,PNEUMONIA  . Pneumonia Mother     DIED AGE 68,MRSA,COPD,PNEUMONIA  . COPD Brother     AGE 11  . Heart disease Sister     DIED AGE 70, SMOKER,?HEART    ROS: no nausea, vomiting; no fever, chills; no melena, hematochezia; no claudication  PHYSICAL EXAM: BP 124/84  Pulse 98  Ht 5' 4.5" (1.638 m)  Wt 211 lb (95.709 kg)  BMI 35.66 kg/m2  SpO2 96% GENERAL: 61 year old female, obese; NAD HEENT: NCAT, PERRLA, EOMI; sclera clear; no xanthelasma NECK: palpable bilateral carotid pulses, no bruits; no JVD; no TM LUNGS: CTA bilaterally CARDIAC: RRR (S1, S2); no significant murmurs; no rubs or gallops ABDOMEN: Protuberant EXTREMETIES: no significant peripheral edema SKIN: warm/dry; no obvious rash/lesions MUSCULOSKELETAL: no joint deformity NEURO: no focal deficit; NL affect   EKG:    ASSESSMENT & PLAN:  AV NODAL REENTRY TACHYCARDIA Continues to do well on current medication regimen, with decreased episodes of breakthrough palpitations following up titration of Toprol dose, at time of last OV. Reassess clinical status in 6 months, at which time she will establish with Dr. Diona Browner, here in our [place] clinic.  ESSENTIAL HYPERTENSION Followed by primary M.D.  DIABETES MELLITUS Followed by primary M.D. Would consider adding a cholesterol lowering agent, for primary prevention.  Hypothyroidism Followed by primary M.D.    Gene Sloan Galentine, PAC

## 2011-12-22 NOTE — Patient Instructions (Signed)
Continue all current medications. Your physician wants you to follow up in: 6 months.  You will receive a reminder letter in the mail one-two months in advance.  If you don't receive a letter, please call our office to schedule the follow up appointment   

## 2012-01-01 ENCOUNTER — Ambulatory Visit: Payer: Self-pay | Admitting: Cardiology

## 2012-01-18 ENCOUNTER — Encounter

## 2012-01-21 ENCOUNTER — Encounter

## 2012-03-08 ENCOUNTER — Telehealth: Payer: Self-pay | Admitting: Internal Medicine

## 2012-03-08 NOTE — Telephone Encounter (Signed)
Error.[name] ° °

## 2012-03-10 ENCOUNTER — Encounter: Payer: Self-pay | Admitting: Internal Medicine

## 2012-03-10 ENCOUNTER — Ambulatory Visit (INDEPENDENT_AMBULATORY_CARE_PROVIDER_SITE_OTHER): Payer: Self-pay | Admitting: Internal Medicine

## 2012-03-10 VITALS — BP 126/72 | HR 106 | Ht 64.0 in | Wt 214.2 lb

## 2012-03-10 DIAGNOSIS — J441 Chronic obstructive pulmonary disease with (acute) exacerbation: Secondary | ICD-10-CM

## 2012-03-10 DIAGNOSIS — G252 Other specified forms of tremor: Secondary | ICD-10-CM

## 2012-03-10 DIAGNOSIS — J45901 Unspecified asthma with (acute) exacerbation: Secondary | ICD-10-CM

## 2012-03-10 DIAGNOSIS — G250 Essential tremor: Secondary | ICD-10-CM

## 2012-03-10 DIAGNOSIS — K219 Gastro-esophageal reflux disease without esophagitis: Secondary | ICD-10-CM

## 2012-03-10 MED ORDER — PROMETHAZINE-CODEINE 6.25-10 MG/5ML PO SYRP: 5.0000 mL | ORAL_SOLUTION | ORAL | Status: DC | PRN

## 2012-03-10 NOTE — Patient Instructions (Addendum)
Cough syrup refilled  Dulera can be used 1-2 puffs, then rinse mouth, twice daily.   Try taking 2 puffs each morning for awhile to see if that helps your cough around lunch time.  Please call as needed
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
## 2012-03-15 ENCOUNTER — Telehealth: Payer: Self-pay | Admitting: Internal Medicine

## 2012-03-15 MED ORDER — PREDNISONE 20 MG PO TABS: 20.0000 mg | ORAL_TABLET | Freq: Every day | ORAL | Status: DC

## 2012-03-15 NOTE — Telephone Encounter (Signed)
Pt already has phenergan with codeine cough syrup and this is not helping. She was given rx for this at last OV on 03-10-12. Please advise. Carron Curie, CMA

## 2012-03-15 NOTE — Telephone Encounter (Signed)
Per CY-can she take phenergan with codeine? If so then give Phenergan with codeine cough syrup #[id number] 1 tsp every 6 hours prn no refills.

## 2012-03-15 NOTE — Telephone Encounter (Signed)
Last OV 03-10-12. Pt states she has been taking dulera 2 puffs twice daily since last OV and she does not see any improvement in her cough. She states her cough is not as bad first thing in the morning, but it gets much worse at lunchtime and in the evening. She states the dulera has not helped this at all. Pt states she is taking all other meds as directed and using cough syrup at night. Please advise. Carron Curie, CMA Allergies  Allergen Reactions  . Guaifenesin     REACTION: asthma attacks  . Oxycodone-Acetaminophen   . Guaifenesin Er

## 2012-03-15 NOTE — Telephone Encounter (Signed)
Recommend work on the reflux triggered part of her cough. Suggest otc omeprazole twice daily for the next week.  What else does she think might help?

## 2012-03-15 NOTE — Telephone Encounter (Signed)
Pt already taking omeprazole twice a day requesting prednisone.

## 2012-03-15 NOTE — Telephone Encounter (Signed)
Per cy  Prednisone 20 mg # 5 for 5 days  rx sent and pt is aware

## 2012-03-20 NOTE — Assessment & Plan Note (Addendum)
Probable recent viral pattern exacerbation. I think she was trying to save money by using [facility] infrequently. She agrees to increase to 2 puffs, twice daily until this episode clears. We can refill cough syrup. We discussed prednisone which I want to avoid because of her brittle diabetes.

## 2012-03-20 NOTE — Assessment & Plan Note (Signed)
Reviewed reflux precautions 

## 2012-03-20 NOTE — Assessment & Plan Note (Signed)
This includes a component of spastic dysphonia/vocal cord dysfunction

## 2012-04-14 ENCOUNTER — Encounter

## 2012-04-18 MED ADMIN — ioversol (OPTIRAY) 350 mg iodine/mL contrast solution 100 mL: INTRAVENOUS | @ 16:00:00 | NDC 00019133311

## 2012-05-17 ENCOUNTER — Ambulatory Visit: Payer: Self-pay | Admitting: Internal Medicine

## 2012-05-26 ENCOUNTER — Telehealth: Payer: Self-pay | Admitting: Internal Medicine

## 2012-05-26 MED ORDER — MOMETASONE FURO-FORMOTEROL FUM 200-5 MCG/ACT IN AERO: 1.0000 | INHALATION_SPRAY | Freq: Two times a day (BID) | RESPIRATORY_TRACT | Status: DC

## 2012-05-26 NOTE — Telephone Encounter (Signed)
1 sample of dulera 200/5 placed at front for pick up.  Pt aware and voiced no further questions or concerns at this time.

## 2012-06-29 ENCOUNTER — Ambulatory Visit: Payer: Self-pay | Admitting: Cardiology

## 2012-07-12 ENCOUNTER — Telehealth: Payer: Self-pay | Admitting: Internal Medicine

## 2012-07-12 MED ORDER — MOMETASONE FURO-FORMOTEROL FUM 100-5 MCG/ACT IN AERO: 2.0000 | INHALATION_SPRAY | Freq: Two times a day (BID) | RESPIRATORY_TRACT | Status: DC

## 2012-07-12 NOTE — Telephone Encounter (Signed)
Per CY-okay to sample Dulera 100/5 2 puffs then rinse BID.

## 2012-07-12 NOTE — Telephone Encounter (Signed)
I spoke with pt. She is requesting a sample of dulera. She has not called for a sample since feb 2014. Please advise Dr. Maple Hudson thanks Last OV 03/10/12 Pending 09/08/12

## 2012-07-12 NOTE — Telephone Encounter (Signed)
1 sample dulera 100 left up for the pt  Pt aware

## 2012-08-11 ENCOUNTER — Ambulatory Visit (INDEPENDENT_AMBULATORY_CARE_PROVIDER_SITE_OTHER): Payer: BC Managed Care – PPO | Admitting: Family Medicine

## 2012-08-11 ENCOUNTER — Encounter: Payer: Self-pay | Admitting: Family Medicine

## 2012-08-11 ENCOUNTER — Ambulatory Visit: Payer: Self-pay | Admitting: Family Medicine

## 2012-08-11 VITALS — BP 121/81 | HR 88 | Temp 98.7°F | Resp 16 | Ht 64.5 in | Wt 199.8 lb

## 2012-08-11 DIAGNOSIS — E039 Hypothyroidism, unspecified: Secondary | ICD-10-CM

## 2012-08-11 DIAGNOSIS — E119 Type 2 diabetes mellitus without complications: Secondary | ICD-10-CM

## 2012-08-11 DIAGNOSIS — G250 Essential tremor: Secondary | ICD-10-CM

## 2012-08-11 DIAGNOSIS — I10 Essential (primary) hypertension: Secondary | ICD-10-CM

## 2012-08-11 DIAGNOSIS — G252 Other specified forms of tremor: Secondary | ICD-10-CM

## 2012-08-11 MED ORDER — ALBUTEROL SULFATE (2.5 MG/3ML) 0.083% IN NEBU: 2.5000 mg | INHALATION_SOLUTION | Freq: Four times a day (QID) | RESPIRATORY_TRACT | Status: DC | PRN

## 2012-08-11 MED ORDER — PRIMIDONE 50 MG PO TABS: ORAL_TABLET | ORAL | Status: DC

## 2012-08-20 ENCOUNTER — Emergency Department (HOSPITAL_COMMUNITY)
Admission: EM | Admit: 2012-08-20 | Discharge: 2012-08-20 | Disposition: A | Discharge status: Home / Self Care | Payer: BC Managed Care – PPO | Attending: Emergency Medicine | Admitting: Emergency Medicine

## 2012-08-20 ENCOUNTER — Encounter: Payer: Self-pay | Admitting: Family Medicine

## 2012-08-20 ENCOUNTER — Encounter (HOSPITAL_COMMUNITY): Payer: Self-pay

## 2012-08-20 DIAGNOSIS — J441 Chronic obstructive pulmonary disease with (acute) exacerbation: Secondary | ICD-10-CM | POA: Insufficient documentation

## 2012-08-20 DIAGNOSIS — Z7982 Long term (current) use of aspirin: Secondary | ICD-10-CM | POA: Insufficient documentation

## 2012-08-20 DIAGNOSIS — Z8669 Personal history of other diseases of the nervous system and sense organs: Secondary | ICD-10-CM | POA: Insufficient documentation

## 2012-08-20 DIAGNOSIS — Z8709 Personal history of other diseases of the respiratory system: Secondary | ICD-10-CM | POA: Insufficient documentation

## 2012-08-20 DIAGNOSIS — F411 Generalized anxiety disorder: Secondary | ICD-10-CM | POA: Insufficient documentation

## 2012-08-20 DIAGNOSIS — K219 Gastro-esophageal reflux disease without esophagitis: Secondary | ICD-10-CM | POA: Insufficient documentation

## 2012-08-20 DIAGNOSIS — E785 Hyperlipidemia, unspecified: Secondary | ICD-10-CM | POA: Insufficient documentation

## 2012-08-20 DIAGNOSIS — J449 Chronic obstructive pulmonary disease, unspecified: Secondary | ICD-10-CM

## 2012-08-20 DIAGNOSIS — Z87891 Personal history of nicotine dependence: Secondary | ICD-10-CM | POA: Insufficient documentation

## 2012-08-20 DIAGNOSIS — Z79899 Other long term (current) drug therapy: Secondary | ICD-10-CM | POA: Insufficient documentation

## 2012-08-20 DIAGNOSIS — E119 Type 2 diabetes mellitus without complications: Secondary | ICD-10-CM | POA: Insufficient documentation

## 2012-08-20 DIAGNOSIS — IMO0002 Reserved for concepts with insufficient information to code with codable children: Secondary | ICD-10-CM | POA: Insufficient documentation

## 2012-08-20 DIAGNOSIS — I10 Essential (primary) hypertension: Secondary | ICD-10-CM | POA: Insufficient documentation

## 2012-08-20 DIAGNOSIS — Z794 Long term (current) use of insulin: Secondary | ICD-10-CM | POA: Insufficient documentation

## 2012-08-20 MED ORDER — ALBUTEROL SULFATE (5 MG/ML) 0.5% IN NEBU
10.0000 mg | INHALATION_SOLUTION | Freq: Once | RESPIRATORY_TRACT | Status: AC
  Administered 2012-08-20: 10 mg via RESPIRATORY_TRACT
  Filled 2012-08-20: qty 2

## 2012-08-20 MED ORDER — DOXYCYCLINE HYCLATE 100 MG PO TABS
100.0000 mg | ORAL_TABLET | Freq: Once | ORAL | Status: AC
  Administered 2012-08-20: 100 mg via ORAL
  Filled 2012-08-20: qty 1

## 2012-08-20 MED ORDER — ALBUTEROL SULFATE (5 MG/ML) 0.5% IN NEBU
2.5000 mg | INHALATION_SOLUTION | Freq: Once | RESPIRATORY_TRACT | Status: AC
  Administered 2012-08-20: 2.5 mg via RESPIRATORY_TRACT
  Filled 2012-08-20: qty 0.5

## 2012-08-20 MED ORDER — IPRATROPIUM BROMIDE 0.02 % IN SOLN
0.5000 mg | Freq: Once | RESPIRATORY_TRACT | Status: AC
  Administered 2012-08-20: 0.5 mg via RESPIRATORY_TRACT
  Filled 2012-08-20: qty 2.5

## 2012-08-20 MED ORDER — DOXYCYCLINE HYCLATE 100 MG PO CAPS: 100.0000 mg | ORAL_CAPSULE | Freq: Two times a day (BID) | ORAL | Status: DC

## 2012-08-20 MED ORDER — DEXAMETHASONE SODIUM PHOSPHATE 4 MG/ML IJ SOLN
10.0000 mg | Freq: Once | INTRAMUSCULAR | Status: AC
  Administered 2012-08-20: 10 mg via INTRAMUSCULAR
  Filled 2012-08-20: qty 3

## 2012-08-20 NOTE — Assessment & Plan Note (Signed)
Obtain old records for recent lab info.

## 2012-08-20 NOTE — Assessment & Plan Note (Addendum)
Ween off neurontin--1 tab po qhs x 1 wk and then stop. Will do trial of primidone 50 mg qd, titrate up by 50mg  q week until improvement is noted.  Therapeutic expectations and side effect profile of medication discussed today.  Patient's questions answered.

## 2012-08-20 NOTE — ED Notes (Signed)
Pt presents with c/o productive cough x 3 days that started with a sore throat that started 24 hrs prior to the cough. Pt has noted forced expiratory wheezing. Pt reports being seen and treated at [facility] yesterday with Oral steroids, IV fluid and chest x-ray that was "normal" per pt. No SOB noted as history and examine was being completed.

## 2012-08-20 NOTE — Assessment & Plan Note (Signed)
Stable/normal here today. Continue current meds. Obtain old records for recent lab info.

## 2012-08-20 NOTE — Assessment & Plan Note (Signed)
Poor control but working hard on diet. Her latest insulin changes are not clear. Labs obtained fairly recently per pt report, so I won't get any today--will await old records.

## 2012-08-20 NOTE — ED Provider Notes (Signed)
History  This chart was scribed for Joya Gaskins, MD by Shari Heritage, ED Scribe. The patient was seen in room APA08/APA08. Patient's care was started at 1110.   CSN: [id number]  Arrival date & time 08/20/12  1046   First MD Initiated Contact with Patient 08/20/12 1110      Chief Complaint  Patient presents with  . Wheezing  . Cough     The history is provided by the patient. No language interpreter was used.    HPI Comments: ADRIELLE POLAKOWSKI is a 62 y.o. female with history of COPD who presents to the Emergency Department complaining of constant wheezing and moderate constant productive cough that began 3 days ago. Cough is productive of thick, green and yellow sputum. Patient has also had a sore throat for the past 3 days. There is associated dyspnea on exertion and chest discomfort with cough. Patient was seen at [facility] for this problem yesterday where she was given oral prednisone and breathing treatments. She states that she has been using medicines as prescribed, but her symptoms have worsened. She denies vomiting, diarrhea, abdominal pain, leg swelling, calf pain, extremity weakness. Patient has never been admitted to the ICU for COPD exacerbation. Patient's other medical history includes hyperlipidemia, hypertension, diabetes, reflux and anxiety. She does not smoke.  She reports that she had CXR yesterday that "Was normal"   Past Medical History  Diagnosis Date  . Hyperlipidemia   . Hypertension   . Diabetes mellitus   . Asthma   . 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 Hillis Range, residual palpitations  . Vocal cord dysfunction   . Esophageal reflux   . Tremor, essential     Neurontin 600 mg qhs x 1 yr--no help.  Inderal prior to this was helpful for 10 yrs then stopped working.  . Anxiety   . Allergic rhinitis     Past Surgical History  Procedure Laterality Date  . Tonsillectomy    . Right breast cyst    . Left ankle  ligament repair    . Left elbow repair    . Partial hysterectomy    . Cholecystectomy    . Cesarean section    . Ablation for avnrt      Family History  Problem Relation Age of Onset  . Lung cancer Father     DIED AGE 43 LUNG CA  . COPD Mother     DIED AGE 59,MRSA,COPD,PNEUMONIA  . Pneumonia Mother     DIED AGE 59,MRSA,COPD,PNEUMONIA  . COPD Brother     AGE 20  . Heart disease Sister     DIED AGE 58, SMOKER,?HEART    History  Substance Use Topics  . Smoking status: Former Smoker -- 0.30 packs/day for 10 years    Types: Cigarettes    Quit date: 04/06/1985  . Smokeless tobacco: Never Used  . Alcohol Use: No     Comment: very rarely have a drink    OB History   Grav Para Term Preterm Abortions TAB SAB Ect Mult Living                  Review of Systems A complete 10 system review of systems was obtained and all systems are negative except as noted in the HPI and PMH.   Allergies  Guaifenesin; Oxycodone-acetaminophen; and Guaifenesin er  Home Medications   Current Outpatient Rx  Name  Route  Sig  Dispense  Refill  .  albuterol (PROVENTIL) (2.5 MG/3ML) 0.083% nebulizer solution   Nebulization   Take 3 mLs (2.5 mg total) by nebulization every 6 (six) hours as needed.   75 mL   1   . Antipyrine-Benzocaine-Polycos (OTIC EDGE) 5.4-1.4-0.0097 % SOLN      1-2 drops to ear three times daily as needed   1 Bottle   prn   . aspirin 81 MG tablet   Oral   Take 81 mg by mouth daily.         . cyclobenzaprine (FLEXERIL) 10 MG tablet   Oral   Take 5-10 mg by mouth 3 (three) times daily as needed for muscle spasms.         . fexofenadine (ALLEGRA) 180 MG tablet   Oral   Take 180 mg by mouth daily as needed.          Marland Kitchen ibuprofen (ADVIL,MOTRIN) 800 MG tablet   Oral   Take 800 mg by mouth every 8 (eight) hours as needed.           . insulin detemir (LEVEMIR) 100 UNIT/ML injection   Subcutaneous   Inject 25 Units into the skin at bedtime.          .  insulin NPH-regular (NOVOLIN 70/30) (70-30) 100 UNIT/ML injection   Subcutaneous   Inject 18 Units into the skin 3 (three) times daily with meals.         Marland Kitchen levothyroxine (SYNTHROID, LEVOTHROID) 25 MCG tablet   Oral   Take 75 mcg by mouth daily.          . metFORMIN (GLUCOPHAGE) 1000 MG tablet   Oral   Take 1,000 mg by mouth 2 (two) times daily with a meal.           . metoprolol succinate (TOPROL-XL) 50 MG 24 hr tablet   Oral   Take 50 mg by mouth 2 (two) times daily. Take 1 1/2 tabs (75mg ) by mouth daily         . mometasone-formoterol (DULERA) 200-5 MCG/ACT AERO   Inhalation   Inhale 1-2 puffs into the lungs 2 (two) times daily.   1 Inhaler   0   . Multiple Vitamin (MULTIVITAMIN) tablet   Oral   Take 1 tablet by mouth daily.         Marland Kitchen olmesartan-hydrochlorothiazide (BENICAR HCT) 40-25 MG per tablet   Oral   Take 1 tablet by mouth daily.           . Omega-3 Fatty Acids (FISH OIL) 1000 MG CAPS      Take one by mouth daily           . primidone (MYSOLINE) 50 MG tablet      1 tab po qhs x 7d, then 2 tabs po qhs x 7d, then 1 tab po qAM and 2 tabs po qhs x 7d, then 2 tabs po bid   120 tablet   0   . sertraline (ZOLOFT) 100 MG tablet   Oral   Take 100 mg by mouth daily.           . traZODone (DESYREL) 100 MG tablet   Oral   Take 50 mg by mouth at bedtime.         . Triamcinolone Acetonide (NASACORT ALLERGY 24HR NA)   Nasal   Place 2 sprays into the nose daily.           Triage Vitals: BP 120/87  Pulse 91  Temp(Src) 98.3 F (  36.8 C) (Oral)  Resp 26  Ht 5\' 4"  (1.626 m)  Wt 199 lb (90.266 kg)  BMI 34.14 kg/m2  SpO2 100%  Physical Exam CONSTITUTIONAL: Well developed/well nourished HEAD: Normocephalic/atraumatic EYES: EOMI/PERRL ENMT: Mucous membranes moist NECK: supple no meningeal signs SPINE:entire spine nontender CV: S1/S2 noted, no murmurs/rubs/gallops noted Chest - tender to palpation LUNGS: bilateral wheezes, no apparent  distress ABDOMEN: soft, nontender, no rebound or guarding GU:no cva tenderness NEURO: Pt is awake/alert, moves all extremitiesx4 EXTREMITIES: pulses normal, full ROM, no peripheral edema SKIN: warm, color normal PSYCH: no abnormalities of mood noted   ED Course  Procedures DIAGNOSTIC STUDIES: Oxygen Saturation is 100% on room air, normal by my interpretation.    COORDINATION OF CARE: 12:16 PM- Patient informed of current plan for treatment and evaluation and agrees with plan at this time.   Pt requested steroid "shot" as she only took half dose of prednisone today She responded well to nebs.  She feels improved, no hypoxia.  Due to change in sputum, will start antibioitcs No further imaging as she reports CXR yesterday at [facility] hospital    MDM  Nursing notes including past medical history and social history reviewed and considered in documentation      Date: 08/20/2012  Rate: 87  Rhythm: normal sinus rhythm  QRS Axis: normal  Intervals: normal  ST/T Wave abnormalities: normal  Conduction Disutrbances:none  Narrative Interpretation:   Old EKG Reviewed: unchanged    I personally performed the services described in this documentation, which was scribed in my presence. The recorded information has been reviewed and is accurate.      Joya Gaskins, MD 08/20/12 1440

## 2012-08-20 NOTE — ED Notes (Signed)
Pt reports being sick since Wednesday w/ sore throat and cough, today has been wheezing and breathing treatments are not working, coughing up thick yellow/green mucus no fever.

## 2012-08-20 NOTE — ED Notes (Signed)
Pt ambulated in hallway without difficulty. SAO2 >95% on room air. No wheezing or SOB noted. EDP notified.

## 2012-08-20 NOTE — Progress Notes (Signed)
Office Note 08/20/2012  CC:  Chief Complaint  Patient presents with  . Establish Care    NP to establish.    HPI:  [name] is a 62 y.o. White female who is here with her husband today to establish care. Patient's most recent primary MD: [facility]. Old records in EPIC/[facility] EMR were reviewed prior to or during today's visit.  Reviewed history/meds.   Describes some poorly controlled DM but she denies any known micro or macrovasc complications from DM.  Last HbA1c was 9+% and she says she did some tighter dietary restrictions in response to this.  Fastings 130s lately, 2H PP's 150-170 range.    Hx of chronic LBP with intermittent periods of radiating pain down back of leg to mid hamstring level--could be either leg.  Denies leg paresthesias or weakness.  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  Says flexeril and heat help her back a lot.  Says asthma has been well controlled.  Long hx of essential tremor, apparently never has responded to beta blocker.  Most recent med tried has been neurontin and pt denies any improvement on it (x many months).   Past Medical History  Diagnosis Date  . Hyperlipidemia   . Hypertension   . Diabetes mellitus   . Asthma   . 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 Hillis Range, residual palpitations  . Vocal cord dysfunction   . Esophageal reflux   . Tremor, essential     Neurontin 600 mg qhs x 1 yr--no help.  Inderal prior to this was helpful for 10 yrs then stopped working.  . Anxiety   . Allergic rhinitis     Past Surgical History  Procedure Laterality Date  . Tonsillectomy    . Right breast cyst    . Left ankle ligament repair    . Left elbow repair    . Partial hysterectomy    . Cholecystectomy    . Cesarean section    . Ablation for avnrt    . Colonoscopy  pre 2004 approx    Dr. Oretha Caprice  .  Esophagogastroduodenoscopy (egd) with esophageal dilation  multiple    Dr. Oretha Caprice    Family History  Problem Relation Age of Onset  . Lung cancer Father     DIED AGE 32 LUNG CA  . COPD Mother     DIED AGE 96,MRSA,COPD,PNEUMONIA  . Pneumonia Mother     DIED AGE 96,MRSA,COPD,PNEUMONIA  . COPD Brother     AGE 6  . Heart disease Sister     DIED AGE 100, SMOKER,?HEART    History   Social History  . Marital Status: Married    Spouse Name: N/A    Number of Children: N/A  . Years of Education: N/A   Occupational History  . Not on file.   Social History Main Topics  . Smoking status: Former Smoker -- 0.30 packs/day for 10 years    Types: Cigarettes    Quit date: 04/06/1985  . Smokeless tobacco: Never Used  . Alcohol Use: No     Comment: very rarely have a drink  . Drug Use: No  . Sexually Active: No   Other Topics Concern  . Not on file   Social History Narrative   Married, 1 daughte, living.  Lives with husband in [place], [place].   1 child died brain tumor at age  7.   Two grandchildren.   Works at [employer], patient currently lost her job.   Retired 2011.   No tobacco.   Alcohol: none in 30 yrs.  Distant history of heavy use.   No drug use.    Outpatient Encounter Prescriptions as of 08/11/2012  Medication Sig Dispense Refill  . aspirin 81 MG tablet Take 81 mg by mouth daily.      . cyclobenzaprine (FLEXERIL) 10 MG tablet Take 5-10 mg by mouth 3 (three) times daily as needed for muscle spasms.      Marland Kitchen ibuprofen (ADVIL,MOTRIN) 800 MG tablet Take 800 mg by mouth every 8 (eight) hours as needed for pain.       Marland Kitchen insulin detemir (LEVEMIR) 100 UNIT/ML injection Inject 25 Units into the skin at bedtime.       . insulin NPH-regular (NOVOLIN 70/30) (70-30) 100 UNIT/ML injection Inject 18 Units into the skin 3 (three) times daily with meals.      . metFORMIN (GLUCOPHAGE) 1000 MG tablet Take 1,000 mg by mouth 2 (two) times daily with a meal.        .  mometasone-formoterol (DULERA) 200-5 MCG/ACT AERO Inhale 1-2 puffs into the lungs 2 (two) times daily.  1 Inhaler  0  . Multiple Vitamin (MULTIVITAMIN) tablet Take 1 tablet by mouth daily.      Marland Kitchen olmesartan-hydrochlorothiazide (BENICAR HCT) 40-25 MG per tablet Take 1 tablet by mouth daily.        . Omega-3 Fatty Acids (FISH OIL) 1000 MG CAPS Take 1 capsule by mouth daily. Take one by mouth daily       . sertraline (ZOLOFT) 100 MG tablet Take 100 mg by mouth daily.        . traZODone (DESYREL) 100 MG tablet Take 50 mg by mouth at bedtime.      . Triamcinolone Acetonide (NASACORT ALLERGY 24HR NA) Place 2 sprays into the nose daily.      . [DISCONTINUED] albuterol (PROVENTIL) (2.5 MG/3ML) 0.083% nebulizer solution Take 2.5 mg by nebulization every 6 (six) hours as needed.        . [DISCONTINUED] albuterol (PROVENTIL) (2.5 MG/3ML) 0.083% nebulizer solution Take 3 mLs (2.5 mg total) by nebulization every 6 (six) hours as needed.  75 mL  1  . [DISCONTINUED] Antipyrine-Benzocaine-Polycos (OTIC EDGE) 5.4-1.4-0.0097 % SOLN 1-2 drops to ear three times daily as needed  1 Bottle  prn  . [DISCONTINUED] fexofenadine (ALLEGRA) 180 MG tablet Take 180 mg by mouth daily as needed.       . [DISCONTINUED] gabapentin (NEURONTIN) 300 MG capsule Take 600 mg by mouth at bedtime.       . [DISCONTINUED] levothyroxine (SYNTHROID, LEVOTHROID) 25 MCG tablet Take 75 mcg by mouth daily.       . [DISCONTINUED] metoprolol succinate (TOPROL-XL) 50 MG 24 hr tablet Take 1 1/2 tabs (75mg ) by mouth daily  45 tablet  6  . [DISCONTINUED] metoprolol succinate (TOPROL-XL) 50 MG 24 hr tablet Take 50 mg by mouth 2 (two) times daily. Take 1 1/2 tabs (75mg ) by mouth daily      . primidone (MYSOLINE) 50 MG tablet 1 tab po qhs x 7d, then 2 tabs po qhs x 7d, then 1 tab po qAM and 2 tabs po qhs x 7d, then 2 tabs po bid  120 tablet  0  . [DISCONTINUED] insulin lispro (HUMALOG) 100 UNIT/ML injection Inject 30 Units into the skin 3 (three) times daily  before meals. Use as directed       . [  DISCONTINUED] mometasone-formoterol (DULERA) 100-5 MCG/ACT AERO Inhale 2 puffs into the lungs 2 (two) times daily.  1 Inhaler  0  . [DISCONTINUED] predniSONE (DELTASONE) 20 MG tablet Take 1 tablet (20 mg total) by mouth daily.  5 tablet  0  . [DISCONTINUED] promethazine-codeine (PHENERGAN WITH CODEINE) 6.25-10 MG/5ML syrup Take 5 mLs by mouth every 4 (four) hours as needed for cough.  180 mL  1   No facility-administered encounter medications on file as of 08/11/2012.    Allergies  Allergen Reactions  . Guaifenesin     REACTION: asthma attacks  . Oxycodone-Acetaminophen     TYLOX.  . Guaifenesin Er     ROS Review of Systems  Constitutional: Negative for fever and fatigue.  HENT: Negative for congestion and sore throat.   Eyes: Negative for visual disturbance.  Respiratory: Negative for cough.   Cardiovascular: Negative for chest pain.  Gastrointestinal: Negative for nausea and abdominal pain.  Genitourinary: Negative for dysuria and vaginal bleeding.  Musculoskeletal: Negative for joint swelling.  Skin: Negative for rash.  Neurological: Negative for weakness and headaches.  Hematological: Negative for adenopathy.    PE; Blood pressure 121/81, pulse 88, temperature 98.7 F (37.1 C), temperature source Oral, resp. rate 16, height 5' 4.5" (1.638 m), weight 199 lb 12 oz (90.606 kg), SpO2 98.00%. Gen: Alert, well appearing.  Patient is oriented to person, place, time, and situation. ENT:   Eyes: no injection, icteris, swelling, or exudate.  EOMI, PERRLA. Nose: no drainage or turbinate edema/swelling.  No injection or focal lesion.  Mouth: lips without lesion/swelling.  Oral mucosa pink and moist.   Oropharynx without erythema, exudate, or swelling.  Neck - No masses or thyromegaly or limitation in range of motion CV: RRR, no m/r/g.   LUNGS: CTA bilat, nonlabored resps, good aeration in all lung fields. ABD: soft, ND/NT. EXT: no clubbing,  cyanosis, or edema.   Pertinent labs:  None today  ASSESSMENT AND PLAN:   New pt: obtain old records.  TREMOR, ESSENTIAL Ween off neurontin--1 tab po qhs x 1 wk and then stop. Will do trial of primidone 50 mg qd, titrate up by 50mg  q week until improvement is noted.  Therapeutic expectations and side effect profile of medication discussed today.  Patient's questions answered.   DIABETES MELLITUS Poor control but working hard on diet. Her latest insulin changes are not clear. Labs obtained fairly recently per pt report, so I won't get any today--will await old records.  ESSENTIAL HYPERTENSION Stable/normal here today. Continue current meds. Obtain old records for recent lab info.  Hypothyroidism Obtain old records for recent lab info.   An After Visit Summary was printed and given to the patient.  Spent 30 min with pt today, with >50% of this time spent in counseling and care coordination regarding the above problems.  Return for f/u 3-4 wks for eval of back pain.

## 2012-08-24 ENCOUNTER — Encounter (HOSPITAL_COMMUNITY): Payer: Self-pay | Admitting: Emergency Medicine

## 2012-08-24 ENCOUNTER — Emergency Department (HOSPITAL_COMMUNITY): Payer: BC Managed Care – PPO

## 2012-08-24 ENCOUNTER — Emergency Department (HOSPITAL_COMMUNITY)
Admission: EM | Admit: 2012-08-24 | Discharge: 2012-08-24 | Disposition: A | Discharge status: Home / Self Care | Payer: BC Managed Care – PPO | Attending: Emergency Medicine | Admitting: Emergency Medicine

## 2012-08-24 DIAGNOSIS — Z794 Long term (current) use of insulin: Secondary | ICD-10-CM | POA: Insufficient documentation

## 2012-08-24 DIAGNOSIS — IMO0002 Reserved for concepts with insufficient information to code with codable children: Secondary | ICD-10-CM | POA: Insufficient documentation

## 2012-08-24 DIAGNOSIS — Z8679 Personal history of other diseases of the circulatory system: Secondary | ICD-10-CM | POA: Insufficient documentation

## 2012-08-24 DIAGNOSIS — Z8719 Personal history of other diseases of the digestive system: Secondary | ICD-10-CM | POA: Insufficient documentation

## 2012-08-24 DIAGNOSIS — Z862 Personal history of diseases of the blood and blood-forming organs and certain disorders involving the immune mechanism: Secondary | ICD-10-CM | POA: Insufficient documentation

## 2012-08-24 DIAGNOSIS — J45909 Unspecified asthma, uncomplicated: Secondary | ICD-10-CM

## 2012-08-24 DIAGNOSIS — Z8709 Personal history of other diseases of the respiratory system: Secondary | ICD-10-CM | POA: Insufficient documentation

## 2012-08-24 DIAGNOSIS — J45901 Unspecified asthma with (acute) exacerbation: Secondary | ICD-10-CM | POA: Insufficient documentation

## 2012-08-24 DIAGNOSIS — I10 Essential (primary) hypertension: Secondary | ICD-10-CM | POA: Insufficient documentation

## 2012-08-24 DIAGNOSIS — Z8669 Personal history of other diseases of the nervous system and sense organs: Secondary | ICD-10-CM | POA: Insufficient documentation

## 2012-08-24 DIAGNOSIS — Z7982 Long term (current) use of aspirin: Secondary | ICD-10-CM | POA: Insufficient documentation

## 2012-08-24 DIAGNOSIS — Z9089 Acquired absence of other organs: Secondary | ICD-10-CM | POA: Insufficient documentation

## 2012-08-24 DIAGNOSIS — Z87891 Personal history of nicotine dependence: Secondary | ICD-10-CM | POA: Insufficient documentation

## 2012-08-24 DIAGNOSIS — K219 Gastro-esophageal reflux disease without esophagitis: Secondary | ICD-10-CM | POA: Insufficient documentation

## 2012-08-24 DIAGNOSIS — F411 Generalized anxiety disorder: Secondary | ICD-10-CM | POA: Insufficient documentation

## 2012-08-24 DIAGNOSIS — E119 Type 2 diabetes mellitus without complications: Secondary | ICD-10-CM | POA: Insufficient documentation

## 2012-08-24 DIAGNOSIS — Z8639 Personal history of other endocrine, nutritional and metabolic disease: Secondary | ICD-10-CM | POA: Insufficient documentation

## 2012-08-24 DIAGNOSIS — Z79899 Other long term (current) drug therapy: Secondary | ICD-10-CM | POA: Insufficient documentation

## 2012-08-24 MED ORDER — SODIUM CHLORIDE 0.9 % IV BOLUS (SEPSIS)
500.0000 mL | Freq: Once | INTRAVENOUS | Status: AC
  Administered 2012-08-24: 500 mL via INTRAVENOUS

## 2012-08-24 MED ORDER — IPRATROPIUM BROMIDE 0.02 % IN SOLN
0.5000 mg | Freq: Once | RESPIRATORY_TRACT | Status: AC
  Administered 2012-08-24: 0.5 mg via RESPIRATORY_TRACT
  Filled 2012-08-24: qty 2.5

## 2012-08-24 MED ORDER — PREDNISONE 20 MG PO TABS: ORAL_TABLET | ORAL | Status: DC

## 2012-08-24 MED ORDER — METHYLPREDNISOLONE SODIUM SUCC 125 MG IJ SOLR
125.0000 mg | Freq: Once | INTRAMUSCULAR | Status: AC
  Administered 2012-08-24: 125 mg via INTRAVENOUS
  Filled 2012-08-24: qty 2

## 2012-08-24 MED ORDER — ALBUTEROL SULFATE (5 MG/ML) 0.5% IN NEBU
5.0000 mg | INHALATION_SOLUTION | Freq: Once | RESPIRATORY_TRACT | Status: AC
  Administered 2012-08-24: 5 mg via RESPIRATORY_TRACT
  Filled 2012-08-24: qty 1

## 2012-08-24 NOTE — ED Notes (Signed)
Pt c/o cough and SOB x1 week. Pt states she was seen at [facility] last Friday and given breathing tx. Pt states symptoms have not improved. Pt states cough is productive with clear mucus.

## 2012-08-24 NOTE — ED Provider Notes (Signed)
History     CSN: [id number]  Arrival date & time 08/24/12  [name]   First MD Initiated Contact with Patient 08/24/12 1936      Chief Complaint  Patient presents with  . Shortness of Breath    (Consider location/radiation/quality/duration/timing/severity/associated sxs/prior treatment) HPI.... asthma attack, dyspnea for several days .      Seen at [facility] ER on Saturday for same and sent home.  Symptoms have persisted. Exertion makes symptoms worse. No fever, sweats, chills.  She has been using her inhaler medicine.  Past Medical History  Diagnosis Date  . Hyperlipidemia   . Hypertension   . Diabetes mellitus   . Asthma   . 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 Hillis Range, residual palpitations  . Vocal cord dysfunction   . Esophageal reflux   . Tremor, essential     Neurontin 600 mg qhs x 1 yr--no help.  Inderal prior to this was helpful for 10 yrs then stopped working.  . Anxiety   . Allergic rhinitis     Past Surgical History  Procedure Laterality Date  . Tonsillectomy    . Right breast cyst    . Left ankle ligament repair    . Left elbow repair    . Partial hysterectomy    . Cholecystectomy    . Cesarean section    . Ablation for avnrt    . Colonoscopy  pre 2004 approx    Dr. Oretha Caprice  . Esophagogastroduodenoscopy (egd) with esophageal dilation  multiple    Dr. Oretha Caprice    Family History  Problem Relation Age of Onset  . Lung cancer Father     DIED AGE 59 LUNG CA  . COPD Mother     DIED AGE 70,MRSA,COPD,PNEUMONIA  . Pneumonia Mother     DIED AGE 70,MRSA,COPD,PNEUMONIA  . COPD Brother     AGE 52  . Heart disease Sister     DIED AGE 77, SMOKER,?HEART    History  Substance Use Topics  . Smoking status: Former Smoker -- 0.30 packs/day for 10 years    Types: Cigarettes    Quit date: 04/06/1985  . Smokeless tobacco: Never Used  . Alcohol Use: No     Comment: very rarely have a drink    OB History    Grav Para Term Preterm Abortions TAB SAB Ect Mult Living                  Review of Systems  All other systems reviewed and are negative.    Allergies  Guaifenesin; Oxycodone-acetaminophen; and Guaifenesin er  Home Medications   Current Outpatient Rx  Name  Route  Sig  Dispense  Refill  . albuterol (PROVENTIL) (2.5 MG/3ML) 0.083% nebulizer solution   Nebulization   Take 2.5 mg by nebulization every 6 (six) hours as needed for wheezing or shortness of breath.         Marland Kitchen aspirin EC 81 MG tablet   Oral   Take 81 mg by mouth daily.         . diphenhydrAMINE (BENADRYL) 25 MG tablet   Oral   Take 25 mg by mouth at bedtime as needed for allergies.         Marland Kitchen gabapentin (NEURONTIN) 100 MG capsule   Oral   Take 100 mg by mouth at bedtime.         . insulin detemir (LEVEMIR) 100 UNIT/ML injection   Subcutaneous  Inject 25 Units into the skin at bedtime.          . insulin NPH-regular (NOVOLIN 70/30) (70-30) 100 UNIT/ML injection   Subcutaneous   Inject 18 Units into the skin 3 (three) times daily with meals.         Marland Kitchen levothyroxine (SYNTHROID, LEVOTHROID) 75 MCG tablet   Oral   Take 75 mcg by mouth daily before breakfast.         . metFORMIN (GLUCOPHAGE) 1000 MG tablet   Oral   Take 1,000 mg by mouth 2 (two) times daily with a meal.           . metoprolol succinate (TOPROL-XL) 50 MG 24 hr tablet   Oral   Take 50 mg by mouth 2 (two) times daily. Take with or immediately following a meal.         . mometasone-formoterol (DULERA) 200-5 MCG/ACT AERO   Inhalation   Inhale 1-2 puffs into the lungs 2 (two) times daily.   1 Inhaler   0   . Multiple Vitamin (MULTIVITAMIN) tablet   Oral   Take 1 tablet by mouth daily.         Marland Kitchen olmesartan-hydrochlorothiazide (BENICAR HCT) 40-25 MG per tablet   Oral   Take 1 tablet by mouth daily.           . Omega-3 Fatty Acids (FISH OIL) 1000 MG CAPS   Oral   Take 1 capsule by mouth daily. Take one by mouth  daily          . sertraline (ZOLOFT) 100 MG tablet   Oral   Take 100 mg by mouth daily.           . Triamcinolone Acetonide (NASACORT ALLERGY 24HR NA)   Nasal   Place 2 sprays into the nose daily.         . cyclobenzaprine (FLEXERIL) 10 MG tablet   Oral   Take 5 mg by mouth 3 (three) times daily as needed for muscle spasms.          Marland Kitchen doxycycline (VIBRAMYCIN) 100 MG capsule   Oral   Take 1 capsule (100 mg total) by mouth 2 (two) times daily.   14 capsule   0   . ibuprofen (ADVIL,MOTRIN) 800 MG tablet   Oral   Take 800 mg by mouth every 8 (eight) hours as needed for pain.          . predniSONE (DELTASONE) 20 MG tablet      3 tabs po daily x 3 days, then 2 tabs x 3 days, then 1.5 tabs x 3 days, then 1 tab x 3 days, then 0.5 tabs x 3 days   27 tablet   0   . primidone (MYSOLINE) 50 MG tablet      1 tab po qhs x 7d, then 2 tabs po qhs x 7d, then 1 tab po qAM and 2 tabs po qhs x 7d, then 2 tabs po bid   120 tablet   0     BP 108/65  Pulse 104  Temp(Src) 97.7 F (36.5 C) (Oral)  Resp 20  Ht 5' 4.5" (1.638 m)  Wt 199 lb (90.266 kg)  BMI 33.64 kg/m2  SpO2 99%  Physical Exam  Nursing note and vitals reviewed. Constitutional: She is oriented to person, place, and time. She appears well-developed and well-nourished.  HENT:  Head: Normocephalic and atraumatic.  Eyes: Conjunctivae and EOM are normal. Pupils are equal, round, and  reactive to light.  Neck: Normal range of motion. Neck supple.  Cardiovascular: Normal rate, regular rhythm and normal heart sounds.   Pulmonary/Chest: Effort normal.  Expiratory wheeze  Abdominal: Soft. Bowel sounds are normal.  Musculoskeletal: Normal range of motion.  Neurological: She is alert and oriented to person, place, and time.  Skin: Skin is warm and dry.  Psychiatric: She has a normal mood and affect.    ED Course  Procedures (including critical care time)  Labs Reviewed - No data to display Dg Chest 2  View  08/24/2012   *RADIOLOGY REPORT*  Clinical Data: Shortness of breath.  Wheezing.  CHEST - 2 VIEW  Comparison:  08/19/2012  Findings:  The heart size and mediastinal contours are within normal limits.  Both lungs are clear.  The visualized skeletal structures are unremarkable.  IMPRESSION: No active cardiopulmonary disease.   Original Report Authenticated By: Myles Rosenthal, M.D.     1. Asthma       MDM  Patient feels much better after nebulizer treatment and IV steroids.   Chest x-ray negative for pneumonia. Vital signs are normal including oxygenation.   Discharge meds include prednisone taper         Donnetta Hutching, MD 08/24/12 2228

## 2012-08-24 NOTE — ED Notes (Signed)
Patient complaining of shortness of breath that started last week with cold like symptoms. States worsening shortness of breath today with no improvement using home neb treatments.

## 2012-09-01 ENCOUNTER — Ambulatory Visit: Payer: BC Managed Care – PPO | Admitting: Family Medicine

## 2012-09-01 ENCOUNTER — Encounter: Payer: Self-pay | Admitting: Family Medicine

## 2012-09-01 ENCOUNTER — Ambulatory Visit (INDEPENDENT_AMBULATORY_CARE_PROVIDER_SITE_OTHER): Payer: BC Managed Care – PPO | Admitting: Family Medicine

## 2012-09-01 VITALS — BP 105/69 | HR 108 | Temp 98.7°F | Resp 18 | Wt 191.8 lb

## 2012-09-01 DIAGNOSIS — J45901 Unspecified asthma with (acute) exacerbation: Secondary | ICD-10-CM

## 2012-09-01 DIAGNOSIS — IMO0001 Reserved for inherently not codable concepts without codable children: Secondary | ICD-10-CM | POA: Insufficient documentation

## 2012-09-01 DIAGNOSIS — I10 Essential (primary) hypertension: Secondary | ICD-10-CM

## 2012-09-01 DIAGNOSIS — E039 Hypothyroidism, unspecified: Secondary | ICD-10-CM

## 2012-09-01 DIAGNOSIS — B370 Candidal stomatitis: Secondary | ICD-10-CM

## 2012-09-01 DIAGNOSIS — G250 Essential tremor: Secondary | ICD-10-CM

## 2012-09-01 DIAGNOSIS — J4540 Moderate persistent asthma, uncomplicated: Secondary | ICD-10-CM | POA: Insufficient documentation

## 2012-09-01 DIAGNOSIS — G252 Other specified forms of tremor: Secondary | ICD-10-CM

## 2012-09-01 DIAGNOSIS — J449 Chronic obstructive pulmonary disease, unspecified: Secondary | ICD-10-CM

## 2012-09-01 DIAGNOSIS — L03019 Cellulitis of unspecified finger: Secondary | ICD-10-CM

## 2012-09-01 LAB — COMPREHENSIVE METABOLIC PANEL
ALT: 21 U/L (ref 0–35)
AST: 20 U/L (ref 0–37)
Albumin: 4 g/dL (ref 3.5–5.2)
Alkaline Phosphatase: 75 U/L (ref 39–117)
BUN: 15 mg/dL (ref 6–23)
CO2: 25 mEq/L (ref 19–32)
Calcium: 9.6 mg/dL (ref 8.4–10.5)
Chloride: 96 mEq/L (ref 96–112)
Creatinine, Ser: 1 mg/dL (ref 0.4–1.2)
GFR: 60.05 mL/min (ref >60.00)
Glucose, Bld: 428 mg/dL — ABNORMAL HIGH (ref 70–99)
Potassium: 4.2 mEq/L (ref 3.5–5.1)
Sodium: 133 mEq/L — ABNORMAL LOW (ref 135–145)
Total Bilirubin: 0.5 mg/dL (ref 0.3–1.2)
Total Protein: 7.2 g/dL (ref 6.0–8.3)

## 2012-09-01 LAB — TSH: TSH: 0.92 u[IU]/mL (ref 0.35–5.50)

## 2012-09-01 LAB — CBC WITH DIFFERENTIAL/PLATELET
Basophils Absolute: 0 10*3/uL (ref 0.0–0.1)
Basophils Relative: 0.3 % (ref 0.0–3.0)
Eosinophils Absolute: 0.1 10*3/uL (ref 0.0–0.7)
Eosinophils Relative: 1.1 % (ref 0.0–5.0)
HCT: 38.7 % (ref 36.0–46.0)
Hemoglobin: 13.4 g/dL (ref 12.0–15.0)
Lymphocytes Relative: 27.4 % (ref 12.0–46.0)
Lymphs Abs: 3.5 10*3/uL (ref 0.7–4.0)
MCHC: 34.5 g/dL (ref 30.0–36.0)
MCV: 87.5 fl (ref 78.0–100.0)
Monocytes Absolute: 0.7 10*3/uL (ref 0.1–1.0)
Monocytes Relative: 5.8 % (ref 3.0–12.0)
Neutro Abs: 8.3 10*3/uL — ABNORMAL HIGH (ref 1.4–7.7)
Neutrophils Relative %: 65.4 % (ref 43.0–77.0)
Platelets: 278 10*3/uL (ref 150.0–400.0)
RBC: 4.43 Mil/uL (ref 3.87–5.11)
RDW: 13.5 % (ref 11.5–14.6)
WBC: 12.7 10*3/uL — ABNORMAL HIGH (ref 4.5–10.5)

## 2012-09-01 LAB — HEMOGLOBIN A1C: Hgb A1c MFr Bld: 10.8 % — ABNORMAL HIGH (ref 4.6–6.5)

## 2012-09-01 MED ORDER — INSULIN PEN NEEDLE 32G X 6 MM MISC: Status: DC

## 2012-09-01 MED ORDER — NYSTATIN 100000 UNIT/ML MT SUSP: OROMUCOSAL | Status: DC

## 2012-09-01 NOTE — Assessment & Plan Note (Signed)
Start mysoline trial now.

## 2012-09-01 NOTE — Assessment & Plan Note (Signed)
Nystatin suspension, 5ml qid swish, gargle, and spit x 10-14d.

## 2012-09-01 NOTE — Progress Notes (Signed)
OFFICE NOTE  09/01/2012  CC:  Chief Complaint  Patient presents with  . Follow-up    3-4 wk [Back Pain]; Post Ed: 05.21.14 Ashtma attack  . Sore Throat    Pt c/o sore throat w/hoarseness  . Finger pain    Pt c/o pain Left middle finger beside nailbed x4 days     HPI: Patient is a 62 y.o. Caucasian female who is here for 3 wk f/u of essential tremor and DM.  Still have not received any old records from prior MD office. Since last visit she has been to ED x 2 for asthma/COPD flare, was put on prednisone x 5d.  She had to return 1 wk later, was given IV steroid but refused any oral steroids after this b/c she was fearful it would raise her sugar too much--she has been out of strips until yesterday and last nights cbg was 594.  She took 30 units of 70/30 and shortly after this she took 10 units of levemir.  This morning her cbg was 435.   She was rx'd doxycycline but did not get this b/c she says it cost too much.   I rx'd her mysoline last visit but she just purchased this yesterday and hasn't started it yet.   Overall, from a pulm standpoint she feels improved, still some coughing but no SOB/wheezing currently.  No chest pain. Says middle finger on left hand has a little redness and tenderness around the nail for the last couple of days.  She has not done any soaks to the area.  Also, has tick bite on back of neck x 2 wks ago, wants me to look at it today.  No pain.  No myalgias or fevers or HA's.   Pertinent PMH:  Past Medical History  Diagnosis Date  . Hyperlipidemia   . Hypertension   . Diabetes mellitus   . Asthma   . 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 Hillis Range, residual palpitations  . Vocal cord dysfunction   . Esophageal reflux   . Tremor, essential     Neurontin 600 mg qhs x 1 yr--no help.  Inderal prior to this was helpful for 10 yrs then stopped working.  . Anxiety   . Allergic rhinitis    Past Surgical History  Procedure  Laterality Date  . Tonsillectomy    . Right breast cyst    . Left ankle ligament repair    . Left elbow repair    . Partial hysterectomy    . Cholecystectomy    . Cesarean section    . Ablation for avnrt    . Colonoscopy  pre 2004 approx    Dr. Oretha Caprice  . Esophagogastroduodenoscopy (egd) with esophageal dilation  multiple    Dr. Oretha Caprice    MEDS:  Outpatient Prescriptions Prior to Visit  Medication Sig Dispense Refill  . albuterol (PROVENTIL) (2.5 MG/3ML) 0.083% nebulizer solution Take 2.5 mg by nebulization every 6 (six) hours as needed for wheezing or shortness of breath.      Marland Kitchen aspirin EC 81 MG tablet Take 81 mg by mouth daily.      . cyclobenzaprine (FLEXERIL) 10 MG tablet Take 5 mg by mouth 3 (three) times daily as needed for muscle spasms.       . diphenhydrAMINE (BENADRYL) 25 MG tablet Take 25 mg by mouth at bedtime as needed for allergies.      Marland Kitchen gabapentin (NEURONTIN) 100 MG capsule  Take 100 mg by mouth at bedtime.      Marland Kitchen ibuprofen (ADVIL,MOTRIN) 800 MG tablet Take 800 mg by mouth every 8 (eight) hours as needed for pain.       Marland Kitchen insulin detemir (LEVEMIR) 100 UNIT/ML injection Inject 25 Units into the skin at bedtime.       . insulin NPH-regular (NOVOLIN 70/30) (70-30) 100 UNIT/ML injection Inject 18 Units into the skin 3 (three) times daily with meals.      Marland Kitchen levothyroxine (SYNTHROID, LEVOTHROID) 75 MCG tablet Take 75 mcg by mouth daily before breakfast.      . metFORMIN (GLUCOPHAGE) 1000 MG tablet Take 1,000 mg by mouth 2 (two) times daily with a meal.        . metoprolol succinate (TOPROL-XL) 50 MG 24 hr tablet Take 50 mg by mouth 2 (two) times daily. Take with or immediately following a meal.      . mometasone-formoterol (DULERA) 200-5 MCG/ACT AERO Inhale 1-2 puffs into the lungs 2 (two) times daily.  1 Inhaler  0  . Multiple Vitamin (MULTIVITAMIN) tablet Take 1 tablet by mouth daily.      Marland Kitchen olmesartan-hydrochlorothiazide (BENICAR HCT) 40-25 MG per tablet Take 1  tablet by mouth daily.        . Omega-3 Fatty Acids (FISH OIL) 1000 MG CAPS Take 1 capsule by mouth daily. Take one by mouth daily       . sertraline (ZOLOFT) 100 MG tablet Take 100 mg by mouth daily.        . Triamcinolone Acetonide (NASACORT ALLERGY 24HR NA) Place 2 sprays into the nose daily.      Marland Kitchen doxycycline (VIBRAMYCIN) 100 MG capsule Take 1 capsule (100 mg total) by mouth 2 (two) times daily.  14 capsule  0  . predniSONE (DELTASONE) 20 MG tablet 3 tabs po daily x 3 days, then 2 tabs x 3 days, then 1.5 tabs x 3 days, then 1 tab x 3 days, then 0.5 tabs x 3 days  27 tablet  0  . primidone (MYSOLINE) 50 MG tablet 1 tab po qhs x 7d, then 2 tabs po qhs x 7d, then 1 tab po qAM and 2 tabs po qhs x 7d, then 2 tabs po bid  120 tablet  0   No facility-administered medications prior to visit.    PE: Blood pressure 105/69, pulse 108, temperature 98.7 F (37.1 C), temperature source Oral, resp. rate 18, weight 191 lb 12 oz (86.977 kg), SpO2 96.00%. Gen: Alert, well appearing.  Patient is oriented to person, place, time, and situation. Resting tremor in entire upper body noted. ENT: Ears: EACs clear, normal epithelium.  TMs with good light reflex and landmarks bilaterally.  Eyes: no injection, icteris, swelling, or exudate.  EOMI, PERRLA. Nose: no drainage or turbinate edema/swelling.  No injection or focal lesion.  Mouth: lips without lesion/swelling.  Oral mucosa pink and moist.  Tongue has diffuse white film partially adherent to it--mostly on posterior 1/3rd of tongue. Oropharynx without erythema, exudate, or swelling.  Neck - No masses or thyromegaly or limitation in range of motion CV: RRR, no m/r/g.   LUNGS: CTA bilat, nonlabored resps, good aeration in all lung fields. SKIN: back of neck with 1 mm pink papules with central ulceration c/w healing insect bite.  No tenderness, exudate, swelling, or surrounding erythema.  No induration. Left hand middle finger: medial skin border of nail with a  bit of erythema and a pinpoint of lucency/blanching to palpation.  No  discharge from underneath skin or nail.  No significant swelling.   IMPRESSION AND PLAN:  Type II or unspecified type diabetes mellitus without mention of complication, uncontrolled D/C insulin 70/30. D/C 70/30 insulin. Increase your levemir to 40 Units every night at bedtime. Give 10 units of novolog (pen) with every meal. Check your sugar fasting in the morning and again right before each meal x 1 week. F/u in my office in 1 wk.     Thrush, oral Nystatin suspension, 5ml qid swish, gargle, and spit x 10-14d.  TREMOR, ESSENTIAL Start mysoline trial now.  Asthmatic bronchitis This seems to be resolving. No new meds rx'd for this today.  Paronychia of third finger, left Very early in development. Encouraged warm water soaks frequently. Signs/symptoms to call or return for were reviewed and pt expressed understanding.   Will obtain CBC, CMET, TSH, and HbA1c today.  An After Visit Summary was printed and given to the patient.  FOLLOW UP:  Keep f/u appt for 1 wk from now

## 2012-09-01 NOTE — Assessment & Plan Note (Signed)
D/C insulin 70/30. D/C 70/30 insulin. Increase your levemir to 40 Units every night at bedtime. Give 10 units of novolog (pen) with every meal. Check your sugar fasting in the morning and again right before each meal x 1 week. F/u in my office in 1 wk.

## 2012-09-01 NOTE — Assessment & Plan Note (Signed)
Very early in development. Encouraged warm water soaks frequently. Signs/symptoms to call or return for were reviewed and pt expressed understanding.

## 2012-09-01 NOTE — Assessment & Plan Note (Signed)
This seems to be resolving. No new meds rx'd for this today.

## 2012-09-01 NOTE — Patient Instructions (Addendum)
Increase your levemir to 40 Units every night at bedtime. Give 10 units of novolog (pen) with every meal. Check your sugar fasting in the morning and again right before each meal x 1 week. F/u in my office in 1 wk.

## 2012-09-07 ENCOUNTER — Encounter: Payer: Self-pay | Admitting: Family Medicine

## 2012-09-07 ENCOUNTER — Ambulatory Visit (INDEPENDENT_AMBULATORY_CARE_PROVIDER_SITE_OTHER): Payer: BC Managed Care – PPO | Admitting: Family Medicine

## 2012-09-07 VITALS — BP 114/75 | HR 105 | Temp 98.3°F | Resp 16 | Wt 190.5 lb

## 2012-09-07 DIAGNOSIS — IMO0001 Reserved for inherently not codable concepts without codable children: Secondary | ICD-10-CM

## 2012-09-07 DIAGNOSIS — J45901 Unspecified asthma with (acute) exacerbation: Secondary | ICD-10-CM

## 2012-09-07 DIAGNOSIS — G250 Essential tremor: Secondary | ICD-10-CM

## 2012-09-07 DIAGNOSIS — G252 Other specified forms of tremor: Secondary | ICD-10-CM

## 2012-09-07 DIAGNOSIS — J4541 Moderate persistent asthma with (acute) exacerbation: Secondary | ICD-10-CM

## 2012-09-07 NOTE — Progress Notes (Signed)
OFFICE NOTE  09/07/2012  CC:  Chief Complaint  Patient presents with  . Follow-up    3-4 wk [A&E of Back pain]; pt c/o continued cough w/phlegm yellow-green in color     HPI: Patient is a 62 y.o. Caucasian female who is here for 1 wk f/u DM 2, recent hx of lingering asthmatic bronchitis illness. Last visit we changed her insulin to 40 U levemir qhs and novolog pen 10 units qAC.   Avg fasting over the last week is 275, avg any other time of day is still 350 or so. Started mysolin for tremor and feels improved, no side effect.  She increases her dose tonight. Cough gradually improving, still occ wheezing requiring proventil (which she uses daily).   NO fever or ST.  Pertinent PMH:  Past Medical History  Diagnosis Date  . Hyperlipidemia   . Hypertension   . Diabetes mellitus   . Asthma   . 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 Hillis Range, residual palpitations  . Vocal cord dysfunction   . Esophageal reflux   . Tremor, essential     Neurontin 600 mg qhs x 1 yr--no help.  Inderal prior to this was helpful for 10 yrs then stopped working.  . Anxiety   . Allergic rhinitis    Past surgical, social, and family history reviewed and no changes noted since last office visit.  MEDS:  Outpatient Prescriptions Prior to Visit  Medication Sig Dispense Refill  . albuterol (PROVENTIL) (2.5 MG/3ML) 0.083% nebulizer solution Take 2.5 mg by nebulization every 6 (six) hours as needed for wheezing or shortness of breath.      Marland Kitchen aspirin EC 81 MG tablet Take 81 mg by mouth daily.      . cyclobenzaprine (FLEXERIL) 10 MG tablet Take 5 mg by mouth 3 (three) times daily as needed for muscle spasms.       . diphenhydrAMINE (BENADRYL) 25 MG tablet Take 25 mg by mouth at bedtime as needed for allergies.      Marland Kitchen gabapentin (NEURONTIN) 100 MG capsule Take 100 mg by mouth at bedtime.      Marland Kitchen ibuprofen (ADVIL,MOTRIN) 800 MG tablet Take 800 mg by mouth every 8 (eight) hours  as needed for pain.       Marland Kitchen insulin detemir (LEVEMIR) 100 UNIT/ML injection Inject 25 Units into the skin at bedtime.       . Insulin Pen Needle 32G X 6 MM MISC Use with novolog pen injections tid  90 each  6  . levothyroxine (SYNTHROID, LEVOTHROID) 75 MCG tablet Take 75 mcg by mouth daily before breakfast.      . metFORMIN (GLUCOPHAGE) 1000 MG tablet Take 1,000 mg by mouth 2 (two) times daily with a meal.        . metoprolol succinate (TOPROL-XL) 50 MG 24 hr tablet Take 50 mg by mouth 2 (two) times daily. Take with or immediately following a meal.      . mometasone-formoterol (DULERA) 200-5 MCG/ACT AERO Inhale 1-2 puffs into the lungs 2 (two) times daily.  1 Inhaler  0  . Multiple Vitamin (MULTIVITAMIN) tablet Take 1 tablet by mouth daily.      Marland Kitchen nystatin (MYCOSTATIN) 100000 UNIT/ML suspension 1 tsp swish, gargle, and spit qid  240 mL  0  . olmesartan-hydrochlorothiazide (BENICAR HCT) 40-25 MG per tablet Take 1 tablet by mouth daily.        . Omega-3 Fatty Acids (FISH  OIL) 1000 MG CAPS Take 1 capsule by mouth daily. Take one by mouth daily       . primidone (MYSOLINE) 50 MG tablet 1 tab po qhs x 7d, then 2 tabs po qhs x 7d, then 1 tab po qAM and 2 tabs po qhs x 7d, then 2 tabs po bid  120 tablet  0  . sertraline (ZOLOFT) 100 MG tablet Take 100 mg by mouth daily.        . Triamcinolone Acetonide (NASACORT ALLERGY 24HR NA) Place 2 sprays into the nose daily.      Marland Kitchen doxycycline (VIBRAMYCIN) 100 MG capsule Take 1 capsule (100 mg total) by mouth 2 (two) times daily.  14 capsule  0  . predniSONE (DELTASONE) 20 MG tablet 3 tabs po daily x 3 days, then 2 tabs x 3 days, then 1.5 tabs x 3 days, then 1 tab x 3 days, then 0.5 tabs x 3 days  27 tablet  0   No facility-administered medications prior to visit.    PE: Blood pressure 114/75, pulse 105, temperature 98.3 F (36.8 C), temperature source Oral, resp. rate 16, weight 190 lb 8 oz (86.41 kg), SpO2 99.00%. Gen: Alert, well appearing.  Patient is  oriented to person, place, time, and situation. CV: RRR, no m/r/g.   LUNGS: CTA bilat, nonlabored resps, good aeration in all lung fields. Mild UE tremor with hands outstretched.  No tremor in arms with hands resting on thighs.   Head and neck with mild rhythmic tremor.  IMPRESSION AND PLAN:  1) DM 2, numbers slightly improved with change in insulin regimen recently. Increase levemir to 46 U qhs and novolog to 18 U SQ qAC.  Bring glucose log again for review in 2 wks. Urine microalb/cr and foot exam next o/v.  2) Acute bronchitis--resolving.  Continue dulera, use albut only prn.  3) Essential tremor: improving on mysoline.  FOLLOW UP: 2 wks

## 2012-09-07 NOTE — Patient Instructions (Signed)
Increase Levemir to 46 U every night. Increase Novolog to 18 U SQ at each meal.

## 2012-09-08 ENCOUNTER — Encounter: Payer: Self-pay | Admitting: Internal Medicine

## 2012-09-08 ENCOUNTER — Ambulatory Visit (INDEPENDENT_AMBULATORY_CARE_PROVIDER_SITE_OTHER): Payer: BC Managed Care – PPO | Admitting: Internal Medicine

## 2012-09-08 VITALS — BP 120/76 | HR 99 | Ht 64.0 in | Wt 192.0 lb

## 2012-09-08 DIAGNOSIS — J4540 Moderate persistent asthma, uncomplicated: Secondary | ICD-10-CM

## 2012-09-08 DIAGNOSIS — J45909 Unspecified asthma, uncomplicated: Secondary | ICD-10-CM

## 2012-09-08 MED ORDER — LORAZEPAM 0.5 MG PO TABS: 0.5000 mg | ORAL_TABLET | Freq: Three times a day (TID) | ORAL | Status: DC

## 2012-09-08 MED ORDER — PROMETHAZINE-CODEINE 6.25-10 MG/5ML PO SYRP
5.0000 mL | ORAL_SOLUTION | ORAL | Status: DC | PRN
Start: 2012-09-08 — End: 2012-10-05
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
## 2012-09-08 NOTE — Patient Instructions (Addendum)
Samples Suprax 400 mg, 1 daily x 3 days  Samples Dulera 100   2 puffs then rinse mouth, twice daily  Script lorazepam   1 every 8 hours only if needed for nerves  Script for cough syrup

## 2012-09-13 ENCOUNTER — Telehealth: Payer: Self-pay | Admitting: Internal Medicine

## 2012-09-13 MED ORDER — BENZONATATE 200 MG PO CAPS: 200.0000 mg | ORAL_CAPSULE | Freq: Four times a day (QID) | ORAL | Status: DC | PRN

## 2012-09-13 MED ORDER — AZELASTINE-FLUTICASONE 137-50 MCG/ACT NA SUSP: 1.0000 | Freq: Every day | NASAL | Status: DC

## 2012-09-13 NOTE — Telephone Encounter (Signed)
Called spoke with patient, advised of CY's recs as stated below.  Pt okay with these recommendations and verbalized her understanding - will pick up Dymista sample this afternoon.  Sample documented per protocol.  Tessalon rx sent to verified pharmacy.  Pt advised to call if we can do anything further for her.  Will sign off.

## 2012-09-13 NOTE — Telephone Encounter (Signed)
Per CY-offer sample of Dymista 1-2 sprays in each nostril QHS and RX Tessalon 200 mg #30 take 1 po every 6 hours prn cough no refills.

## 2012-09-13 NOTE — Telephone Encounter (Signed)
I spoke with pt. She c/o cough w. Clear phlem, nasal congestion, chest congestion, lots of sinus drainage, slight wheezing this AM. She has been taking benadryl and taking her phenergan w/ codeine cough syrup. Please advise Dr. Maple Hudson thanks Last OV 09/08/12 Pending 03/10/13 Allergies  Allergen Reactions  . Guaifenesin     REACTION: asthma attacks  . Oxycodone-Acetaminophen     TYLOX.  . Guaifenesin Er

## 2012-09-16 ENCOUNTER — Emergency Department (HOSPITAL_COMMUNITY)
Admission: EM | Admit: 2012-09-16 | Discharge: 2012-09-16 | Disposition: A | Discharge status: Home / Self Care | Payer: BC Managed Care – PPO | Attending: Emergency Medicine | Admitting: Emergency Medicine

## 2012-09-16 ENCOUNTER — Encounter (HOSPITAL_COMMUNITY): Payer: Self-pay | Admitting: *Deleted

## 2012-09-16 DIAGNOSIS — Z794 Long term (current) use of insulin: Secondary | ICD-10-CM | POA: Insufficient documentation

## 2012-09-16 DIAGNOSIS — Z79899 Other long term (current) drug therapy: Secondary | ICD-10-CM | POA: Insufficient documentation

## 2012-09-16 DIAGNOSIS — K219 Gastro-esophageal reflux disease without esophagitis: Secondary | ICD-10-CM | POA: Insufficient documentation

## 2012-09-16 DIAGNOSIS — Z87891 Personal history of nicotine dependence: Secondary | ICD-10-CM | POA: Insufficient documentation

## 2012-09-16 DIAGNOSIS — IMO0002 Reserved for concepts with insufficient information to code with codable children: Secondary | ICD-10-CM | POA: Insufficient documentation

## 2012-09-16 DIAGNOSIS — Z7982 Long term (current) use of aspirin: Secondary | ICD-10-CM | POA: Insufficient documentation

## 2012-09-16 DIAGNOSIS — J45901 Unspecified asthma with (acute) exacerbation: Secondary | ICD-10-CM | POA: Insufficient documentation

## 2012-09-16 DIAGNOSIS — R059 Cough, unspecified: Secondary | ICD-10-CM | POA: Insufficient documentation

## 2012-09-16 DIAGNOSIS — E119 Type 2 diabetes mellitus without complications: Secondary | ICD-10-CM | POA: Insufficient documentation

## 2012-09-16 DIAGNOSIS — Z8679 Personal history of other diseases of the circulatory system: Secondary | ICD-10-CM | POA: Insufficient documentation

## 2012-09-16 DIAGNOSIS — Z8669 Personal history of other diseases of the nervous system and sense organs: Secondary | ICD-10-CM | POA: Insufficient documentation

## 2012-09-16 DIAGNOSIS — I10 Essential (primary) hypertension: Secondary | ICD-10-CM | POA: Insufficient documentation

## 2012-09-16 DIAGNOSIS — Z8709 Personal history of other diseases of the respiratory system: Secondary | ICD-10-CM | POA: Insufficient documentation

## 2012-09-16 DIAGNOSIS — Z9089 Acquired absence of other organs: Secondary | ICD-10-CM | POA: Insufficient documentation

## 2012-09-16 DIAGNOSIS — E785 Hyperlipidemia, unspecified: Secondary | ICD-10-CM | POA: Insufficient documentation

## 2012-09-16 DIAGNOSIS — F411 Generalized anxiety disorder: Secondary | ICD-10-CM | POA: Insufficient documentation

## 2012-09-16 DIAGNOSIS — R05 Cough: Secondary | ICD-10-CM | POA: Insufficient documentation

## 2012-09-16 MED ORDER — PREDNISONE 20 MG PO TABS: ORAL_TABLET | ORAL | Status: DC

## 2012-09-16 MED ORDER — PREDNISONE 50 MG PO TABS
60.0000 mg | ORAL_TABLET | Freq: Once | ORAL | Status: AC
  Administered 2012-09-16: 60 mg via ORAL
  Filled 2012-09-16: qty 1

## 2012-09-16 MED ORDER — IPRATROPIUM BROMIDE 0.02 % IN SOLN
0.5000 mg | Freq: Once | RESPIRATORY_TRACT | Status: AC
  Administered 2012-09-16: 0.5 mg via RESPIRATORY_TRACT
  Filled 2012-09-16: qty 2.5

## 2012-09-16 MED ORDER — ALBUTEROL SULFATE (5 MG/ML) 0.5% IN NEBU: 2.5000 mg | INHALATION_SOLUTION | Freq: Once | RESPIRATORY_TRACT | Status: DC

## 2012-09-16 MED ORDER — ALBUTEROL SULFATE (5 MG/ML) 0.5% IN NEBU
10.0000 mg | INHALATION_SOLUTION | Freq: Once | RESPIRATORY_TRACT | Status: AC
  Administered 2012-09-16: 10 mg via RESPIRATORY_TRACT
  Filled 2012-09-16: qty 2

## 2012-09-16 NOTE — ED Notes (Signed)
Asthma attack started approx 1 hr PTA, states used inhaler with minimal relief.  States has also taken tessalon pearles.

## 2012-09-16 NOTE — ED Provider Notes (Signed)
History    This chart was scribed for Hurman Horn, MD by Quintella Reichert, ED scribe.  This patient was seen in room [facility] and the patient's care was started at 3:18 PM.   CSN: [id number]  Arrival date & time 09/16/12  1455      Chief Complaint  Patient presents with  . Asthma     The history is provided by the patient. No language interpreter was used.    HPI Comments: [name] is a 62 y.o. female who presents to the Emergency Department complaining of 1 hour of sudden-onset asthma exacerbation symptoms, including SOB, wheezing and cough.  Pt has h/o asthma and DM and was last seen here for wheezing 3 weeks ago.  At that visit she was given breathing treatment, with relief of symptoms.  She states that today she walked out into her garden and had a sudden onset of symptoms which she speculates was triggered by allergies.  She has attempted to treat symptoms with her albuterol inhaler, without relief.   She fever, emesis, diarrhea, CP, hallucinations, confusion, or any other symptoms.   She denies changes to appetite, fluid intake, or number of BMs. Pt denies h/o heart failure.       Past Medical History  Diagnosis Date  . Hyperlipidemia   . Hypertension   . Diabetes mellitus   . Asthma   . 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 Hillis Range, residual palpitations  . Vocal cord dysfunction   . Esophageal reflux   . Tremor, essential     Neurontin 600 mg qhs x 1 yr--no help.  Inderal prior to this was helpful for 10 yrs then stopped working.  . Anxiety   . Allergic rhinitis     Past Surgical History  Procedure Laterality Date  . Tonsillectomy    . Right breast cyst    . Left ankle ligament repair    . Left elbow repair    . Partial hysterectomy    . Cholecystectomy    . Cesarean section    . Ablation for avnrt    . Colonoscopy  pre 2004 approx    Dr. Oretha Caprice  . Esophagogastroduodenoscopy (egd) with esophageal  dilation  multiple    Dr. Oretha Caprice    Family History  Problem Relation Age of Onset  . Lung cancer Father     DIED AGE 18 LUNG CA  . COPD Mother     DIED AGE 49,MRSA,COPD,PNEUMONIA  . Pneumonia Mother     DIED AGE 49,MRSA,COPD,PNEUMONIA  . COPD Brother     AGE 76  . Heart disease Sister     DIED AGE 44, SMOKER,?HEART    History  Substance Use Topics  . Smoking status: Former Smoker -- 0.30 packs/day for 10 years    Types: Cigarettes    Quit date: 04/06/1985  . Smokeless tobacco: Never Used  . Alcohol Use: No     Comment: very rarely have a drink    OB History   Grav Para Term Preterm Abortions TAB SAB Ect Mult Living                  Review of Systems 10 Systems reviewed and all are negative for acute change except as noted in the HPI.    Allergies  Guaifenesin; Oxycodone-acetaminophen; and Guaifenesin er  Home Medications   Current Outpatient Rx  Name  Route  Sig  Dispense  Refill  .  albuterol (PROAIR HFA) 108 (90 BASE) MCG/ACT inhaler   Inhalation   Inhale 2 puffs into the lungs every 6 (six) hours as needed for wheezing.         Marland Kitchen aspirin EC 81 MG tablet   Oral   Take 81 mg by mouth daily.         . benzonatate (TESSALON) 200 MG capsule   Oral   Take 1 capsule (200 mg total) by mouth every 6 (six) hours as needed for cough.   30 capsule   0   . diphenhydrAMINE (BENADRYL) 25 MG tablet   Oral   Take 25 mg by mouth at bedtime as needed for allergies.         Marland Kitchen insulin detemir (LEVEMIR) 100 UNIT/ML injection   Subcutaneous   Inject 36 Units into the skin at bedtime.          Marland Kitchen levothyroxine (SYNTHROID, LEVOTHROID) 75 MCG tablet   Oral   Take 75 mcg by mouth daily before breakfast.         . LORazepam (ATIVAN) 0.5 MG tablet   Oral   Take 1 tablet (0.5 mg total) by mouth every 8 (eight) hours. If needed for nerves   30 tablet   0   . metFORMIN (GLUCOPHAGE) 1000 MG tablet   Oral   Take 1,000 mg by mouth 2 (two) times daily  with a meal.           . metoprolol succinate (TOPROL-XL) 50 MG 24 hr tablet   Oral   Take 50 mg by mouth 2 (two) times daily. Take with or immediately following a meal.         . mometasone-formoterol (DULERA) 200-5 MCG/ACT AERO   Inhalation   Inhale 1-2 puffs into the lungs 2 (two) times daily.   1 Inhaler   0   . Multiple Vitamin (MULTIVITAMIN) tablet   Oral   Take 1 tablet by mouth daily.         Marland Kitchen nystatin (MYCOSTATIN) 100000 UNIT/ML suspension   Oral   Take 500,000 Units by mouth 4 (four) times daily. 1 tsp swish, gargle, and spit qid         . olmesartan-hydrochlorothiazide (BENICAR HCT) 40-25 MG per tablet   Oral   Take 1 tablet by mouth daily.           . Omega-3 Fatty Acids (FISH OIL) 1000 MG CAPS   Oral   Take 1 capsule by mouth daily. Take one by mouth daily          . primidone (MYSOLINE) 50 MG tablet      1 tab po qhs x 7d, then 2 tabs po qhs x 7d, then 1 tab po qAM and 2 tabs po qhs x 7d, then 2 tabs po bid   120 tablet   0   . promethazine-codeine (PHENERGAN WITH CODEINE) 6.25-10 MG/5ML syrup   Oral   Take 5 mLs by mouth every 4 (four) hours as needed for cough.   180 mL   1   . sertraline (ZOLOFT) 100 MG tablet   Oral   Take 100 mg by mouth daily.           Marland Kitchen albuterol (PROVENTIL) (2.5 MG/3ML) 0.083% nebulizer solution   Nebulization   Take 2.5 mg by nebulization every 6 (six) hours as needed for wheezing or shortness of breath.         . EXPIRED: Azelastine-Fluticasone (DYMISTA) 137-50 MCG/ACT SUSP  Nasal   Place 1-2 sprays into the nose at bedtime.   1 Bottle   0   . cyclobenzaprine (FLEXERIL) 10 MG tablet   Oral   Take 5 mg by mouth 3 (three) times daily as needed for muscle spasms.          Marland Kitchen ibuprofen (ADVIL,MOTRIN) 800 MG tablet   Oral   Take 800 mg by mouth every 8 (eight) hours as needed for pain.          . predniSONE (DELTASONE) 20 MG tablet      3 tabs po daily x 2 days starting 14Jun2014   6 tablet    0   . Triamcinolone Acetonide (NASACORT ALLERGY 24HR NA)   Nasal   Place 2 sprays into the nose daily.           BP 125/72  Pulse 97  Temp(Src) 98.9 F (37.2 C) (Oral)  Resp 18  Ht 5\' 4"  (1.626 m)  Wt 188 lb (85.276 kg)  BMI 32.25 kg/m2  SpO2 97%  Physical Exam  Nursing note and vitals reviewed. Constitutional:  Awake, alert, nontoxic appearance.  HENT:  Head: Atraumatic.  Eyes: Right eye exhibits no discharge. Left eye exhibits no discharge.  Neck: Neck supple.  Pulmonary/Chest: She is in respiratory distress (Mild at rest). She has wheezes (Diffuse expiratory wheezes). She exhibits no tenderness.  Scattered rhonchi, no crackles, no rales, no accessory muscle use.  Abdominal: Soft. Bowel sounds are normal. There is no tenderness. There is no rebound.  Musculoskeletal: She exhibits no tenderness.  Baseline ROM, no obvious new focal weakness.  Neurological:  Mental status and motor strength appears baseline for patient and situation.  Skin: No rash noted.  Psychiatric: She has a normal mood and affect.    ED Course  Procedures (including critical care time)  DIAGNOSTIC STUDIES: Oxygen Saturation is 97% on room air, normal by my interpretation.    COORDINATION OF CARE: 3:25 PM-Discussed treatment plan which includes 10 mg nebulizer treatment and and 60 mg prednisone treatment with pt at bedside and pt agreed to plan.   Patient understands and agrees with initial ED impression and plan with expectations set for ED visit.  5:38 PM-Patient feels improved after observation and treatment in ED. Lungs clear, pulse ox normal (room air 95%),  Patient informed of clinical course, understands medical decision-making process, and agrees with plan.   Labs Reviewed - No data to display No results found.   1. Asthma attack       MDM  I doubt any other EMC precluding discharge at this time including, but not necessarily limited to the following:  ACS, SBI,  anaphylaxis.   I personally performed the services described in this documentation, which was scribed in my presence. The recorded information has been reviewed and is accurate.       Hurman Horn, MD 09/17/12 1329

## 2012-09-20 ENCOUNTER — Emergency Department (HOSPITAL_COMMUNITY)
Admission: EM | Admit: 2012-09-20 | Discharge: 2012-09-20 | Disposition: A | Discharge status: Home / Self Care | Payer: BC Managed Care – PPO | Attending: Emergency Medicine | Admitting: Emergency Medicine

## 2012-09-20 ENCOUNTER — Encounter (HOSPITAL_COMMUNITY): Payer: Self-pay | Admitting: Emergency Medicine

## 2012-09-20 ENCOUNTER — Telehealth: Payer: Self-pay | Admitting: *Deleted

## 2012-09-20 DIAGNOSIS — Z87891 Personal history of nicotine dependence: Secondary | ICD-10-CM | POA: Insufficient documentation

## 2012-09-20 DIAGNOSIS — F411 Generalized anxiety disorder: Secondary | ICD-10-CM | POA: Insufficient documentation

## 2012-09-20 DIAGNOSIS — J4521 Mild intermittent asthma with (acute) exacerbation: Secondary | ICD-10-CM

## 2012-09-20 DIAGNOSIS — Z794 Long term (current) use of insulin: Secondary | ICD-10-CM | POA: Insufficient documentation

## 2012-09-20 DIAGNOSIS — Z8709 Personal history of other diseases of the respiratory system: Secondary | ICD-10-CM | POA: Insufficient documentation

## 2012-09-20 DIAGNOSIS — E785 Hyperlipidemia, unspecified: Secondary | ICD-10-CM | POA: Insufficient documentation

## 2012-09-20 DIAGNOSIS — Z7982 Long term (current) use of aspirin: Secondary | ICD-10-CM | POA: Insufficient documentation

## 2012-09-20 DIAGNOSIS — I10 Essential (primary) hypertension: Secondary | ICD-10-CM | POA: Insufficient documentation

## 2012-09-20 DIAGNOSIS — Z9089 Acquired absence of other organs: Secondary | ICD-10-CM | POA: Insufficient documentation

## 2012-09-20 DIAGNOSIS — IMO0002 Reserved for concepts with insufficient information to code with codable children: Secondary | ICD-10-CM | POA: Insufficient documentation

## 2012-09-20 DIAGNOSIS — E119 Type 2 diabetes mellitus without complications: Secondary | ICD-10-CM | POA: Insufficient documentation

## 2012-09-20 DIAGNOSIS — R059 Cough, unspecified: Secondary | ICD-10-CM | POA: Insufficient documentation

## 2012-09-20 DIAGNOSIS — J45901 Unspecified asthma with (acute) exacerbation: Secondary | ICD-10-CM | POA: Insufficient documentation

## 2012-09-20 DIAGNOSIS — Z8679 Personal history of other diseases of the circulatory system: Secondary | ICD-10-CM | POA: Insufficient documentation

## 2012-09-20 DIAGNOSIS — R05 Cough: Secondary | ICD-10-CM | POA: Insufficient documentation

## 2012-09-20 DIAGNOSIS — J3489 Other specified disorders of nose and nasal sinuses: Secondary | ICD-10-CM | POA: Insufficient documentation

## 2012-09-20 DIAGNOSIS — Z8669 Personal history of other diseases of the nervous system and sense organs: Secondary | ICD-10-CM | POA: Insufficient documentation

## 2012-09-20 DIAGNOSIS — K219 Gastro-esophageal reflux disease without esophagitis: Secondary | ICD-10-CM | POA: Insufficient documentation

## 2012-09-20 DIAGNOSIS — Z79899 Other long term (current) drug therapy: Secondary | ICD-10-CM | POA: Insufficient documentation

## 2012-09-20 MED ORDER — AZITHROMYCIN 250 MG PO TABS: 250.0000 mg | ORAL_TABLET | Freq: Every day | ORAL | Status: DC

## 2012-09-20 MED ORDER — PREDNISONE 10 MG PO TABS: ORAL_TABLET | ORAL | Status: DC

## 2012-09-20 MED ORDER — IPRATROPIUM BROMIDE 0.02 % IN SOLN
0.5000 mg | Freq: Once | RESPIRATORY_TRACT | Status: AC
  Administered 2012-09-20: 0.5 mg via RESPIRATORY_TRACT
  Filled 2012-09-20: qty 2.5

## 2012-09-20 MED ORDER — ALBUTEROL SULFATE (5 MG/ML) 0.5% IN NEBU
2.5000 mg | INHALATION_SOLUTION | Freq: Once | RESPIRATORY_TRACT | Status: AC
  Administered 2012-09-20: 2.5 mg via RESPIRATORY_TRACT
  Filled 2012-09-20: qty 0.5

## 2012-09-20 MED ORDER — AZITHROMYCIN 250 MG PO TABS
500.0000 mg | ORAL_TABLET | Freq: Once | ORAL | Status: AC
  Administered 2012-09-20: 500 mg via ORAL
  Filled 2012-09-20: qty 2

## 2012-09-20 NOTE — Telephone Encounter (Signed)
Last OV 09-08-12. Pt states she has been to the ER since last visit on 09-16-12 due to asthma attack. She states they prescribed her prednisone 60mg  x 3 days. Pt states while she was on this she felt much better and was able to sing in the choir on Sunday. Pt states today she is back to having increased cough, wheezing, SOB, and chest tightness. Pt states she is using nebulizer's as directed, taking benadryl, nasal spray, avoiding triggers, and trying to stay cool. Pt feels she needs more prednisone. Please advise. Carron Curie, CMA Allergies  Allergen Reactions  . Guaifenesin     MUCINEX REACTION: asthma attacks  . Oxycodone-Acetaminophen     TYLOX.  . Guaifenesin Er

## 2012-09-20 NOTE — Telephone Encounter (Signed)
Pt aware and rx sent. Ardell Makarewicz, CMA  

## 2012-09-20 NOTE — ED Notes (Signed)
Patient complaining of cough and shortness of breath starting this evening. States she was seen here for asthma attack on Friday and was better until tonight.

## 2012-09-20 NOTE — ED Notes (Signed)
Patient ambulated to bathroom with no difficulty or assistance. No complaints of shortness of breath with ambulation.

## 2012-09-20 NOTE — ED Provider Notes (Signed)
History     CSN: [id number]  Arrival date & time 09/20/12  0005   First MD Initiated Contact with Patient 09/20/12 0041      Chief Complaint  Patient presents with  . Cough  . Shortness of Breath    (Consider location/radiation/quality/duration/timing/severity/associated sxs/prior treatment) HPI HPI Comments: [name] is a 62 y.o. female who presents to the Emergency Department complaining of cough, upper airway congestion without relief of a home nebulizer treatment. Was seen in the ER on Friday and given prednisone and continues on prednisone. Today she went outside and tonight developed wheezing and upper airway congestion. Denies fever, chills,  Shortness of breath.  PCP Dr. Milinda Cave Past Medical History  Diagnosis Date  . Hyperlipidemia   . Hypertension   . Diabetes mellitus   . Asthma   . 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 Hillis Range, residual palpitations  . Vocal cord dysfunction   . Esophageal reflux   . Tremor, essential     Neurontin 600 mg qhs x 1 yr--no help.  Inderal prior to this was helpful for 10 yrs then stopped working.  . Anxiety   . Allergic rhinitis     Past Surgical History  Procedure Laterality Date  . Tonsillectomy    . Right breast cyst    . Left ankle ligament repair    . Left elbow repair    . Partial hysterectomy    . Cholecystectomy    . Cesarean section    . Ablation for avnrt    . Colonoscopy  pre 2004 approx    Dr. Oretha Caprice  . Esophagogastroduodenoscopy (egd) with esophageal dilation  multiple    Dr. Oretha Caprice    Family History  Problem Relation Age of Onset  . Lung cancer Father     DIED AGE 39 LUNG CA  . COPD Mother     DIED AGE 67,MRSA,COPD,PNEUMONIA  . Pneumonia Mother     DIED AGE 67,MRSA,COPD,PNEUMONIA  . COPD Brother     AGE 78  . Heart disease Sister     DIED AGE 77, SMOKER,?HEART    History  Substance Use Topics  . Smoking status: Former Smoker --  0.30 packs/day for 10 years    Types: Cigarettes    Quit date: 04/06/1985  . Smokeless tobacco: Never Used  . Alcohol Use: No     Comment: very rarely have a drink    OB History   Grav Para Term Preterm Abortions TAB SAB Ect Mult Living                  Review of Systems  Constitutional: Negative for fever.       10 Systems reviewed and are negative for acute change except as noted in the HPI.  HENT: Negative for congestion.   Eyes: Negative for discharge and redness.  Respiratory: Positive for cough. Negative for shortness of breath.   Cardiovascular: Negative for chest pain.  Gastrointestinal: Negative for vomiting and abdominal pain.  Musculoskeletal: Negative for back pain.  Skin: Negative for rash.  Neurological: Negative for syncope, numbness and headaches.  Psychiatric/Behavioral:       No behavior change.    Allergies  Guaifenesin; Oxycodone-acetaminophen; and Guaifenesin er  Home Medications   Current Outpatient Rx  Name  Route  Sig  Dispense  Refill  . albuterol (PROAIR HFA) 108 (90 BASE) MCG/ACT inhaler   Inhalation   Inhale 2 puffs into the  lungs every 6 (six) hours as needed for wheezing.         Marland Kitchen albuterol (PROVENTIL) (2.5 MG/3ML) 0.083% nebulizer solution   Nebulization   Take 2.5 mg by nebulization every 6 (six) hours as needed for wheezing or shortness of breath.         Marland Kitchen aspirin EC 81 MG tablet   Oral   Take 81 mg by mouth daily.         Marland Kitchen EXPIRED: Azelastine-Fluticasone (DYMISTA) 137-50 MCG/ACT SUSP   Nasal   Place 1-2 sprays into the nose at bedtime.   1 Bottle   0   . benzonatate (TESSALON) 200 MG capsule   Oral   Take 1 capsule (200 mg total) by mouth every 6 (six) hours as needed for cough.   30 capsule   0   . cyclobenzaprine (FLEXERIL) 10 MG tablet   Oral   Take 5 mg by mouth 3 (three) times daily as needed for muscle spasms.          . diphenhydrAMINE (BENADRYL) 25 MG tablet   Oral   Take 25 mg by mouth at bedtime  as needed for allergies.         Marland Kitchen ibuprofen (ADVIL,MOTRIN) 800 MG tablet   Oral   Take 800 mg by mouth every 8 (eight) hours as needed for pain.          Marland Kitchen insulin detemir (LEVEMIR) 100 UNIT/ML injection   Subcutaneous   Inject 36 Units into the skin at bedtime.          Marland Kitchen levothyroxine (SYNTHROID, LEVOTHROID) 75 MCG tablet   Oral   Take 75 mcg by mouth daily before breakfast.         . LORazepam (ATIVAN) 0.5 MG tablet   Oral   Take 1 tablet (0.5 mg total) by mouth every 8 (eight) hours. If needed for nerves   30 tablet   0   . metFORMIN (GLUCOPHAGE) 1000 MG tablet   Oral   Take 1,000 mg by mouth 2 (two) times daily with a meal.           . metoprolol succinate (TOPROL-XL) 50 MG 24 hr tablet   Oral   Take 50 mg by mouth 2 (two) times daily. Take with or immediately following a meal.         . mometasone-formoterol (DULERA) 200-5 MCG/ACT AERO   Inhalation   Inhale 1-2 puffs into the lungs 2 (two) times daily.   1 Inhaler   0   . Multiple Vitamin (MULTIVITAMIN) tablet   Oral   Take 1 tablet by mouth daily.         Marland Kitchen nystatin (MYCOSTATIN) 100000 UNIT/ML suspension   Oral   Take 500,000 Units by mouth 4 (four) times daily. 1 tsp swish, gargle, and spit qid         . olmesartan-hydrochlorothiazide (BENICAR HCT) 40-25 MG per tablet   Oral   Take 1 tablet by mouth daily.           . Omega-3 Fatty Acids (FISH OIL) 1000 MG CAPS   Oral   Take 1 capsule by mouth daily. Take one by mouth daily          . predniSONE (DELTASONE) 20 MG tablet      3 tabs po daily x 2 days starting [id number]   6 tablet   0   . primidone (MYSOLINE) 50 MG tablet      1  tab po qhs x 7d, then 2 tabs po qhs x 7d, then 1 tab po qAM and 2 tabs po qhs x 7d, then 2 tabs po bid   120 tablet   0   . promethazine-codeine (PHENERGAN WITH CODEINE) 6.25-10 MG/5ML syrup   Oral   Take 5 mLs by mouth every 4 (four) hours as needed for cough.   180 mL   1   . sertraline (ZOLOFT)  100 MG tablet   Oral   Take 100 mg by mouth daily.           . Triamcinolone Acetonide (NASACORT ALLERGY 24HR NA)   Nasal   Place 2 sprays into the nose daily.           BP 134/89  Temp(Src) 98.6 F (37 C) (Oral)  Resp 24  Ht 5' 4.5" (1.638 m)  Wt 188 lb (85.276 kg)  BMI 31.78 kg/m2  SpO2 100%  Physical Exam  Nursing note and vitals reviewed. Constitutional: She appears well-developed and well-nourished.  Awake, alert, nontoxic appearance.  HENT:  Head: Normocephalic and atraumatic.  Eyes: EOM are normal. Pupils are equal, round, and reactive to light.  Neck: Neck supple.  Cardiovascular: Normal rate and intact distal pulses.   Pulmonary/Chest: Effort normal. She exhibits no tenderness.  Upper airway coarse breath sounds with some wheezing. coughing  Abdominal: Soft. Bowel sounds are normal. There is no tenderness. There is no rebound.  Musculoskeletal: She exhibits no tenderness.  Baseline ROM, no obvious new focal weakness.  Neurological:  Mental status and motor strength appears baseline for patient and situation.  Skin: No rash noted.  Psychiatric: She has a normal mood and affect.    ED Course  Procedures (including critical care time)    0154 Patient was given albuterol/atrovent with relief of the upper airway congestion and wheezing. Cough settled down. Given zithromax. Breath sounds are clear. MDM  Patient with upper aireay congestion and slight wheezing. Given albuterol/atrovent with relief. Given zithromax. Pt stable in ED with no significant deterioration in condition.The patient appears reasonably screened and/or stabilized for discharge and I doubt any other medical condition or other [facility] requiring further screening, evaluation, or treatment in the ED at this time prior to discharge.  MDM Reviewed: nursing note and vitals          Nicoletta Dress. Colon Branch, MD 09/20/12 0157

## 2012-09-20 NOTE — Telephone Encounter (Signed)
Per CY-okay to give Prednisone 10mg #20 take 4 x 2 days, 3 x 2 days, 2 x 2 days, 1 x 2 days, then stop no refills.  

## 2012-09-21 ENCOUNTER — Telehealth: Payer: Self-pay | Admitting: Family Medicine

## 2012-09-21 ENCOUNTER — Ambulatory Visit: Payer: BC Managed Care – PPO | Admitting: Family Medicine

## 2012-09-21 NOTE — Telephone Encounter (Signed)
Patient called for a novolog flex pen sample.  Okay per provider.  Sample is in fridge with patients name and DOB waiting for pick up.

## 2012-09-22 NOTE — Assessment & Plan Note (Signed)
There is a component of exacerbation of reactive airways/bronchitis. An important part of her respiratory symptoms is anxiety Plan- sample Suprax 400 mg, cough syrup, sample Dulera 100 with medication talk

## 2012-09-25 ENCOUNTER — Telehealth: Payer: Self-pay | Admitting: Pulmonary Disease

## 2012-09-25 NOTE — Telephone Encounter (Signed)
Pt with h/o asthma, and has been having increased symptoms recently.  In ER middle of month and Dr. Maple Hudson recently called in another round of prednisone.  She has cough up slightly tinted yellow mucus with scant blood smaller than the size of a pencil eraser.  No BRB.  She does not feel that she has a chest cold, no congestion, no documented fever ,and does have sinus congestion with postnasal drip.  Is on meds for this.  She has no increased sob currently.  I offered to treat this like an acute bronchitis, but she would like to stay off abx if possible.  I have asked her to monitor this, and to call if worsens or if this does not resolve.

## 2012-10-03 ENCOUNTER — Encounter: Payer: Self-pay | Admitting: Family Medicine

## 2012-10-03 ENCOUNTER — Ambulatory Visit (INDEPENDENT_AMBULATORY_CARE_PROVIDER_SITE_OTHER): Payer: BC Managed Care – PPO | Admitting: Family Medicine

## 2012-10-03 VITALS — BP 122/79 | HR 103 | Temp 98.0°F | Resp 18 | Ht 63.5 in | Wt 191.0 lb

## 2012-10-03 DIAGNOSIS — IMO0001 Reserved for inherently not codable concepts without codable children: Secondary | ICD-10-CM

## 2012-10-03 DIAGNOSIS — F331 Major depressive disorder, recurrent, moderate: Secondary | ICD-10-CM

## 2012-10-03 DIAGNOSIS — F329 Major depressive disorder, single episode, unspecified: Secondary | ICD-10-CM

## 2012-10-03 DIAGNOSIS — F4310 Post-traumatic stress disorder, unspecified: Secondary | ICD-10-CM

## 2012-10-03 DIAGNOSIS — G250 Essential tremor: Secondary | ICD-10-CM

## 2012-10-03 DIAGNOSIS — F341 Dysthymic disorder: Secondary | ICD-10-CM

## 2012-10-03 MED ORDER — SERTRALINE HCL 100 MG PO TABS: ORAL_TABLET | ORAL | Status: DC

## 2012-10-03 MED ORDER — PRIMIDONE 50 MG PO TABS: ORAL_TABLET | ORAL | Status: DC

## 2012-10-03 NOTE — Patient Instructions (Addendum)
Increase your levemir to 50 units subQ at bedtime. Increase your meal time insulin to 22 units each meal.
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
## 2012-10-04 ENCOUNTER — Telehealth: Payer: Self-pay | Admitting: Family Medicine

## 2012-10-04 NOTE — Assessment & Plan Note (Addendum)
Hx of recurrent major depression, current episode moderate. 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  Referral to [facility] ordered today for counseling services.

## 2012-10-04 NOTE — Assessment & Plan Note (Addendum)
Ongoing poor control. Encouraged better dietary compliance, increased activtity. Increase levemir to 50 U qhs and increase novolog (one sample pen given today) to 22 U sub Q qAC. Will do foot exam and urine microalb/cr at future f/u visit, as this visit was largely taken up by discussion of her depression and psychosocial plight.

## 2012-10-04 NOTE — Assessment & Plan Note (Signed)
Improving significantly on mysoline 100 mg bid. Increase to 150 mg bid.

## 2012-10-04 NOTE — Telephone Encounter (Signed)
Agree/noted. 

## 2012-10-04 NOTE — Telephone Encounter (Signed)
Pt called today stating that she forgot two insulin shots today and her am metformin.  Glucose results reading 403 at 3:06pm.  Pt was advise per Dr. Milinda Cave to take morning medication now and continue to take medications as directed QHS. Pt states that she feels okay and she understands how to take her meds now.  Pt is to check glucose after dinner, at bedtime, and in the am and call us with the results.

## 2012-10-05 ENCOUNTER — Encounter: Payer: Self-pay | Admitting: Internal Medicine

## 2012-10-05 ENCOUNTER — Ambulatory Visit (INDEPENDENT_AMBULATORY_CARE_PROVIDER_SITE_OTHER): Payer: BC Managed Care – PPO | Admitting: Internal Medicine

## 2012-10-05 VITALS — BP 102/70 | HR 106 | Ht 64.5 in | Wt 189.3 lb

## 2012-10-05 DIAGNOSIS — J45909 Unspecified asthma, uncomplicated: Secondary | ICD-10-CM

## 2012-10-05 DIAGNOSIS — J4520 Mild intermittent asthma, uncomplicated: Secondary | ICD-10-CM

## 2012-10-05 MED ORDER — PROMETHAZINE-CODEINE 6.25-10 MG/5ML PO SYRP: 5.0000 mL | ORAL_SOLUTION | ORAL | Status: DC | PRN

## 2012-10-05 MED ORDER — AMOXICILLIN-POT CLAVULANATE 875-125 MG PO TABS: 1.0000 | ORAL_TABLET | Freq: Two times a day (BID) | ORAL | Status: DC

## 2012-10-05 MED ORDER — LORAZEPAM 0.5 MG PO TABS: 0.5000 mg | ORAL_TABLET | Freq: Three times a day (TID) | ORAL | Status: DC
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
## 2012-10-05 NOTE — Patient Instructions (Addendum)
Script for augmentin ( amoxacillin clavulinate)  Script for ativan and cough syurup

## 2012-10-05 NOTE — Telephone Encounter (Signed)
Patient called with meter readings 10/04/12 3:06PM 403, took morning meds & insulin 22 units of novolog then at 5:23PM 235 took shot before she ate 7:35PM 57 ate reeces cup went up to 82 ate 2nd reeces cup 9:10PM 95 ate cornbread salad 10:19AM 181. 10/05/12 12:37AM levimir & took night meds ate fruit 285 3:48AM 256 6:21AM 289 took 25 units since her sugars were so low. She is realizes how important her blood sugar levels are. Patient has taken out restraining order on her husband. Patient is requesting a CB.

## 2012-10-08 ENCOUNTER — Emergency Department (HOSPITAL_COMMUNITY): Payer: BC Managed Care – PPO

## 2012-10-08 ENCOUNTER — Encounter (HOSPITAL_COMMUNITY): Payer: Self-pay

## 2012-10-08 ENCOUNTER — Emergency Department (HOSPITAL_COMMUNITY)
Admission: EM | Admit: 2012-10-08 | Discharge: 2012-10-08 | Disposition: A | Discharge status: Home / Self Care | Payer: BC Managed Care – PPO | Attending: Emergency Medicine | Admitting: Emergency Medicine

## 2012-10-08 DIAGNOSIS — J45909 Unspecified asthma, uncomplicated: Secondary | ICD-10-CM | POA: Insufficient documentation

## 2012-10-08 DIAGNOSIS — Z90710 Acquired absence of both cervix and uterus: Secondary | ICD-10-CM | POA: Insufficient documentation

## 2012-10-08 DIAGNOSIS — Z8639 Personal history of other endocrine, nutritional and metabolic disease: Secondary | ICD-10-CM | POA: Insufficient documentation

## 2012-10-08 DIAGNOSIS — Z87891 Personal history of nicotine dependence: Secondary | ICD-10-CM | POA: Insufficient documentation

## 2012-10-08 DIAGNOSIS — Z862 Personal history of diseases of the blood and blood-forming organs and certain disorders involving the immune mechanism: Secondary | ICD-10-CM | POA: Insufficient documentation

## 2012-10-08 DIAGNOSIS — Z794 Long term (current) use of insulin: Secondary | ICD-10-CM | POA: Insufficient documentation

## 2012-10-08 DIAGNOSIS — Z8679 Personal history of other diseases of the circulatory system: Secondary | ICD-10-CM | POA: Insufficient documentation

## 2012-10-08 DIAGNOSIS — Z7982 Long term (current) use of aspirin: Secondary | ICD-10-CM | POA: Insufficient documentation

## 2012-10-08 DIAGNOSIS — Z9889 Other specified postprocedural states: Secondary | ICD-10-CM | POA: Insufficient documentation

## 2012-10-08 DIAGNOSIS — Z8669 Personal history of other diseases of the nervous system and sense organs: Secondary | ICD-10-CM | POA: Insufficient documentation

## 2012-10-08 DIAGNOSIS — IMO0002 Reserved for concepts with insufficient information to code with codable children: Secondary | ICD-10-CM | POA: Insufficient documentation

## 2012-10-08 DIAGNOSIS — R21 Rash and other nonspecific skin eruption: Secondary | ICD-10-CM | POA: Insufficient documentation

## 2012-10-08 DIAGNOSIS — R002 Palpitations: Secondary | ICD-10-CM | POA: Insufficient documentation

## 2012-10-08 DIAGNOSIS — I10 Essential (primary) hypertension: Secondary | ICD-10-CM | POA: Insufficient documentation

## 2012-10-08 DIAGNOSIS — R059 Cough, unspecified: Secondary | ICD-10-CM | POA: Insufficient documentation

## 2012-10-08 DIAGNOSIS — Z8719 Personal history of other diseases of the digestive system: Secondary | ICD-10-CM | POA: Insufficient documentation

## 2012-10-08 DIAGNOSIS — F411 Generalized anxiety disorder: Secondary | ICD-10-CM | POA: Insufficient documentation

## 2012-10-08 DIAGNOSIS — Z79899 Other long term (current) drug therapy: Secondary | ICD-10-CM | POA: Insufficient documentation

## 2012-10-08 DIAGNOSIS — R1031 Right lower quadrant pain: Secondary | ICD-10-CM | POA: Insufficient documentation

## 2012-10-08 DIAGNOSIS — Z8709 Personal history of other diseases of the respiratory system: Secondary | ICD-10-CM | POA: Insufficient documentation

## 2012-10-08 DIAGNOSIS — E119 Type 2 diabetes mellitus without complications: Secondary | ICD-10-CM | POA: Insufficient documentation

## 2012-10-08 DIAGNOSIS — R05 Cough: Secondary | ICD-10-CM | POA: Insufficient documentation

## 2012-10-08 DIAGNOSIS — R197 Diarrhea, unspecified: Secondary | ICD-10-CM | POA: Insufficient documentation

## 2012-10-08 DIAGNOSIS — Z9089 Acquired absence of other organs: Secondary | ICD-10-CM | POA: Insufficient documentation

## 2012-10-08 LAB — BASIC METABOLIC PANEL
BUN: 9 mg/dL (ref 6–23)
CO2: 28 mEq/L (ref 19–32)
Calcium: 9.2 mg/dL (ref 8.4–10.5)
Chloride: 91 mEq/L — ABNORMAL LOW (ref 96–112)
Creatinine, Ser: 0.71 mg/dL (ref 0.50–1.10)
GFR calc Af Amer: >90 mL/min (ref >90)
GFR calc non Af Amer: 90 mL/min — ABNORMAL LOW (ref >90)
Glucose, Bld: 165 mg/dL — ABNORMAL HIGH (ref 70–99)
Potassium: 3.9 mEq/L (ref 3.5–5.1)
Sodium: 126 mEq/L — ABNORMAL LOW (ref 135–145)

## 2012-10-08 LAB — CBC WITH DIFFERENTIAL/PLATELET
Basophils Absolute: 0 10*3/uL (ref 0.0–0.1)
Basophils Relative: 0 % (ref 0–1)
Eosinophils Absolute: 0.2 10*3/uL (ref 0.0–0.7)
Eosinophils Relative: 3 % (ref 0–5)
HCT: 32.9 % — ABNORMAL LOW (ref 36.0–46.0)
Hemoglobin: 11.6 g/dL — ABNORMAL LOW (ref 12.0–15.0)
Lymphocytes Relative: 39 % (ref 12–46)
Lymphs Abs: 2.8 10*3/uL (ref 0.7–4.0)
MCH: 30.4 pg (ref 26.0–34.0)
MCHC: 35.3 g/dL (ref 30.0–36.0)
MCV: 86.1 fL (ref 78.0–100.0)
Monocytes Absolute: 0.5 10*3/uL (ref 0.1–1.0)
Monocytes Relative: 6 % (ref 3–12)
Neutro Abs: 3.8 10*3/uL (ref 1.7–7.7)
Neutrophils Relative %: 52 % (ref 43–77)
Platelets: 254 10*3/uL (ref 150–400)
RBC: 3.82 MIL/uL — ABNORMAL LOW (ref 3.87–5.11)
RDW: 12.9 % (ref 11.5–15.5)
WBC: 7.4 10*3/uL (ref 4.0–10.5)

## 2012-10-08 MED ORDER — LOPERAMIDE HCL 2 MG PO TABS: 2.0000 mg | ORAL_TABLET | Freq: Four times a day (QID) | ORAL | Status: DC | PRN

## 2012-10-08 NOTE — ED Notes (Signed)
Pt states she has had six diarrhea stools in the past one and half hrs. Denies other symptoms

## 2012-10-08 NOTE — ED Notes (Signed)
No further stools

## 2012-10-08 NOTE — ED Provider Notes (Signed)
History  This chart was scribed for Shelda Jakes, MD by Ardelia Mems, ED Scribe. This patient was seen in room APA15/[facility]15 and the patient's care was started at 5:02 PM.  CSN: [id number]  Arrival date & time 10/08/12  1647   Chief Complaint  Patient presents with  . Diarrhea    The history is provided by the patient. No language interpreter was used.   HPI Comments: [name] is a 62 y.o. female who presents to the Emergency Department complaining of persistent diarrhea. Pt states that she has had 6 episodes of diarrhea stools in the past 1.5 hours. Pt denies blood in these bowel movements. She reports associated mild, dull RLQ abdominal pain described as cramping. Pt states that she felt fine prior to the sudden onset of diarrhea earlier today. She also states that she is currently taking Augmentin which she began taking 3 nights ago for bronchitis. Pt states that she has taken Augmentin prior to now and she does not remember if she had diarrhea as a side effect. Pt states that she has eaten peanut butter in an attempt to relieve her diarrhea, without success. Pt states that she has not had a BM since arriving at the ED. Pt has an itchy rash on her right forearm which she states has been present for 1 month. Pt states that she takes 81 mg of ASA daily and she denies taking Coumadin. She denies fever, nausea, vomiting or any other symptoms.  PCP- Dr. Nicoletta Ba   Past Medical History  Diagnosis Date  . Hyperlipidemia   . Hypertension   . Diabetes mellitus   . Asthma   . 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 Hillis Range, residual palpitations  . Vocal cord dysfunction   . Esophageal reflux   . Tremor, essential     Neurontin 600 mg qhs x 1 yr--no help.  Inderal prior to this was helpful for 10 yrs then stopped working.  . Anxiety   . Allergic rhinitis    Past Surgical History  Procedure Laterality Date  . Tonsillectomy    .  Right breast cyst    . Left ankle ligament repair    . Left elbow repair    . Partial hysterectomy    . Cholecystectomy    . Cesarean section    . Ablation for avnrt    . Colonoscopy  pre 2004 approx    Dr. Oretha Caprice  . Esophagogastroduodenoscopy (egd) with esophageal dilation  multiple    Dr. Oretha Caprice   Family History  Problem Relation Age of Onset  . Lung cancer Father     DIED AGE 36 LUNG CA  . COPD Mother     DIED AGE 54,MRSA,COPD,PNEUMONIA  . Pneumonia Mother     DIED AGE 54,MRSA,COPD,PNEUMONIA  . COPD Brother     AGE 37  . Heart disease Sister     DIED AGE 108, SMOKER,?HEART   History  Substance Use Topics  . Smoking status: Former Smoker -- 0.30 packs/day for 10 years    Types: Cigarettes    Quit date: 04/06/1985  . Smokeless tobacco: Never Used  . Alcohol Use: No     Comment: very rarely have a drink   OB History   Grav Para Term Preterm Abortions TAB SAB Ect Mult Living                 Review of Systems  Constitutional: Negative for  fever and chills.  HENT: Negative for congestion, sore throat and rhinorrhea.   Eyes: Negative for visual disturbance.  Respiratory: Positive for cough. Negative for shortness of breath.   Cardiovascular: Positive for palpitations. Negative for chest pain and leg swelling.  Gastrointestinal: Positive for abdominal pain and diarrhea. Negative for nausea and vomiting.  Genitourinary: Negative for dysuria.  Musculoskeletal: Negative for myalgias.  Skin: Positive for rash.  Neurological: Negative for headaches.  Hematological: Does not bruise/bleed easily.  Psychiatric/Behavioral: Negative for confusion.  A complete 10 system review of systems was obtained and all systems are negative except as noted in the HPI and PMH.   Allergies  Guaifenesin; Oxycodone-acetaminophen; and Guaifenesin er  Home Medications   Current Outpatient Rx  Name  Route  Sig  Dispense  Refill  . albuterol (PROAIR HFA) 108 (90 BASE) MCG/ACT  inhaler   Inhalation   Inhale 2 puffs into the lungs every 6 (six) hours as needed for wheezing.         Marland Kitchen albuterol (PROVENTIL) (2.5 MG/3ML) 0.083% nebulizer solution   Nebulization   Take 2.5 mg by nebulization every 6 (six) hours as needed for wheezing or shortness of breath.         Marland Kitchen amoxicillin-clavulanate (AUGMENTIN) 875-125 MG per tablet   Oral   Take 1 tablet by mouth 2 (two) times daily.   14 tablet   0   . aspirin EC 81 MG tablet   Oral   Take 81 mg by mouth daily.         Marland Kitchen EXPIRED: Azelastine-Fluticasone (DYMISTA) 137-50 MCG/ACT SUSP   Nasal   Place 1-2 sprays into the nose at bedtime.   1 Bottle   0   . cyclobenzaprine (FLEXERIL) 10 MG tablet   Oral   Take 5 mg by mouth 3 (three) times daily as needed for muscle spasms.          . diphenhydrAMINE (BENADRYL) 25 MG tablet   Oral   Take 25 mg by mouth at bedtime as needed for allergies.         Marland Kitchen ibuprofen (ADVIL,MOTRIN) 800 MG tablet   Oral   Take 800 mg by mouth every 8 (eight) hours as needed for pain.          . Insulin Aspart (NOVOLOG FLEXPEN [place])   Subcutaneous   Inject 22 Units into the skin 3 (three) times daily before meals.          . insulin detemir (LEVEMIR) 100 UNIT/ML injection   Subcutaneous   Inject 50 Units into the skin at bedtime.          Marland Kitchen levothyroxine (SYNTHROID, LEVOTHROID) 75 MCG tablet   Oral   Take 75 mcg by mouth daily before breakfast.         . LORazepam (ATIVAN) 0.5 MG tablet   Oral   Take 1 tablet (0.5 mg total) by mouth every 8 (eight) hours. If needed for nerves   30 tablet   2   . metFORMIN (GLUCOPHAGE) 1000 MG tablet   Oral   Take 1,000 mg by mouth 2 (two) times daily with a meal.           . metoprolol succinate (TOPROL-XL) 50 MG 24 hr tablet   Oral   Take 50 mg by mouth 2 (two) times daily. Take with or immediately following a meal.         . mometasone-formoterol (DULERA) 200-5 MCG/ACT AERO   Inhalation  Inhale 1-2 puffs into the  lungs 2 (two) times daily.   1 Inhaler   0   . Multiple Vitamin (MULTIVITAMIN) tablet   Oral   Take 1 tablet by mouth daily.         Marland Kitchen olmesartan-hydrochlorothiazide (BENICAR HCT) 40-25 MG per tablet   Oral   Take 1 tablet by mouth daily.           . Omega-3 Fatty Acids (FISH OIL) 1000 MG CAPS   Oral   Take 1 capsule by mouth daily. Take one by mouth daily          . primidone (MYSOLINE) 50 MG tablet      3 tabs po bid   180 tablet   5   . promethazine-codeine (PHENERGAN WITH CODEINE) 6.25-10 MG/5ML syrup   Oral   Take 5 mLs by mouth every 4 (four) hours as needed for cough.   180 mL   1   . sertraline (ZOLOFT) 100 MG tablet      2 tabs po qhs   60 tablet   5   . Triamcinolone Acetonide (NASACORT ALLERGY 24HR NA)   Nasal   Place 2 sprays into the nose daily.          Triage Vitals: BP 107/62  Pulse 92  Temp(Src) 98.1 F (36.7 C) (Oral)  Resp 20  Ht 5' 4.5" (1.638 m)  Wt 189 lb (85.73 kg)  BMI 31.95 kg/m2  SpO2 97%  Physical Exam  Nursing note and vitals reviewed. Cardiovascular: Normal rate, regular rhythm, normal heart sounds and intact distal pulses.   No murmur heard. No ankle swelling.  Pulmonary/Chest: Effort normal and breath sounds normal. No respiratory distress. She has no wheezes.  Abdominal: Soft. Bowel sounds are normal.  Mild RLQ tenderness without guarding.     ED Course  Procedures (including critical care time)  DIAGNOSTIC STUDIES: Oxygen Saturation is 97% on RA, normal by my interpretation.    COORDINATION OF CARE: 5:05 PM- Pt advised of plan for treatment and pt agrees.   Labs Reviewed  CBC WITH DIFFERENTIAL - Abnormal; Notable for the following:    RBC 3.82 (*)    Hemoglobin 11.6 (*)    HCT 32.9 (*)    All other components within normal limits  BASIC METABOLIC PANEL - Abnormal; Notable for the following:    Sodium 126 (*)    Chloride 91 (*)    Glucose, Bld 165 (*)    GFR calc non Af Amer 90 (*)    All other  components within normal limits  CLOSTRIDIUM DIFFICILE BY PCR  STOOL CULTURE   Results for orders placed during the hospital encounter of 10/08/12  CBC WITH DIFFERENTIAL      Result Value Range   WBC 7.4  4.0 - 10.5 K/uL   RBC 3.82 (*) 3.87 - 5.11 MIL/uL   Hemoglobin 11.6 (*) 12.0 - 15.0 g/dL   HCT 16.1 (*) 09.6 - 04.5 %   MCV 86.1  78.0 - 100.0 fL   MCH 30.4  26.0 - 34.0 pg   MCHC 35.3  30.0 - 36.0 g/dL   RDW 40.9  81.1 - 91.4 %   Platelets 254  150 - 400 K/uL   Neutrophils Relative % 52  43 - 77 %   Neutro Abs 3.8  1.7 - 7.7 K/uL   Lymphocytes Relative 39  12 - 46 %   Lymphs Abs 2.8  0.7 - 4.0 K/uL   Monocytes  Relative 6  3 - 12 %   Monocytes Absolute 0.5  0.1 - 1.0 K/uL   Eosinophils Relative 3  0 - 5 %   Eosinophils Absolute 0.2  0.0 - 0.7 K/uL   Basophils Relative 0  0 - 1 %   Basophils Absolute 0.0  0.0 - 0.1 K/uL  BASIC METABOLIC PANEL      Result Value Range   Sodium 126 (*) 135 - 145 mEq/L   Potassium 3.9  3.5 - 5.1 mEq/L   Chloride 91 (*) 96 - 112 mEq/L   CO2 28  19 - 32 mEq/L   Glucose, Bld 165 (*) 70 - 99 mg/dL   BUN 9  6 - 23 mg/dL   Creatinine, Ser 4.09  0.50 - 1.10 mg/dL   Calcium 9.2  8.4 - 81.1 mg/dL   GFR calc non Af Amer 90 (*) >90 mL/min   GFR calc Af Amer >90  >90 mL/min     Dg Abd Acute W/chest  10/08/2012   *RADIOLOGY REPORT*  Clinical Data: Diarrhea, right lower quadrant pain  ACUTE ABDOMEN SERIES (ABDOMEN 2 VIEW & CHEST 1 VIEW)  Comparison: Prior acute abdominal series 07/11/2011  Findings: The lungs are clear.  Stable cardiac and mediastinal contours.  No pneumothorax or effusion.  No free air on the upright view.  There are multiple small fluid air levels throughout the colon.  The bowel gas pattern is not obstructed.  Gas and stool noted as far distally as the rectum.  No acute osseous abnormality.  There is focal degenerative disc disease at L4-L5 with associated mild levoconvex scoliosis. Degenerative osteitis pubis noted in the pubic symphysis.   IMPRESSION:  1.  No acute cardiopulmonary disease. 2.  Nonobstructed bowel gas pattern.  Multiple small air fluid levels in the colon are nonspecific without evidence of distension.   Original Report Authenticated By: Malachy Moan, M.D.    No diagnosis found.  MDM  Diarrhea resolved. Workup in the emergency part without significant leukocytosis or evidence of bowel structure. Patient's abdomen soft nontender. Since patient's on Augmentin had some concerns about the development of C. difficile. But would've expected the diarrhea to continue. This is a possibility however and able to get a stool sample for C. difficile for stool culture. Patient wants to go home will discharge home to followup with her record Dr. Sherryll Burger return for any new or worse symptoms. Will treat with Imodium. Patient is on the Augmentin for COPD bronchitis.        I personally performed the services described in this documentation, which was scribed in my presence. The recorded information has been reviewed and is accurate.     Shelda Jakes, MD 10/08/12 [phone]

## 2012-10-10 NOTE — Telephone Encounter (Signed)
Recommend pt continue current gluc monitoring. Ask her if she had typical hypoglycemic symptoms when her sugar was 57. Ask if she SKIPPED eating that day after she took her morning meds and her 22 units of novolog. From what I can see from most of the info she gave, her sugars are still too high and she needs more insulin. Tell her that I recommend going up on her levemir by 4 more units and continue this dose every night until further notice.  Also, ask about her sleep/wake pattern, b/c it seems like her "mornings" are kind of late in the day, plus she is checking in the middle of the night more than I would expect.----thx

## 2012-10-12 ENCOUNTER — Telehealth: Payer: Self-pay | Admitting: Family Medicine

## 2012-10-12 NOTE — Telephone Encounter (Signed)
05/10/1950 °

## 2012-10-12 NOTE — Telephone Encounter (Signed)
Patient Information:  Caller Name: Amarise  Phone: [phone]  Patient: Quandra, Fedorchak  Gender: Female  DOB: Jul 09, 1950  Age: 62 Years  PCP: Earley Favor [facility])  Office Follow Up:  Does the office need to follow up with this patient?: Yes  Instructions For The Office: Sama states she received a message to call Misty Stanley. Note for Tiarna not seen in EPIC. Please call Claris Che.   Symptoms  Reason For Call & Symptoms: Rushie states she received a call from [place] at [facility] Cailie returning call to [place]. No message for Jimeka noted in [place]. PLEASE CALL Shiya AT [phone]. States she is at home.  Reviewed Health History In EMR: Yes  Reviewed Medications In EMR: Yes  Reviewed Allergies In EMR: Yes  Reviewed Surgeries / Procedures: Yes  Date of Onset of Symptoms: Unknown  Guideline(s) Used:  No Protocol Available - Information Only  Disposition Per Guideline:   Discuss with PCP and Callback by Nurse Today  Reason For Disposition Reached:   Nursing judgment  Advice Given:  N/A  Patient Will Follow Care Advice:  YES

## 2012-10-12 NOTE — Telephone Encounter (Signed)
Noted  

## 2012-10-12 NOTE — Telephone Encounter (Signed)
Patient did not skip any meals, her blood sugar level is up and down at times, and patient states she wakes up between 7 and 8 am in the morning. Patient advised about Levemir change and is agreeable.

## 2012-10-13 ENCOUNTER — Other Ambulatory Visit: Payer: Self-pay

## 2012-10-22 NOTE — Assessment & Plan Note (Signed)
Recent exacerbation Plan-Ativan, Augmentin and cough syrup with medication discussion

## 2012-10-24 ENCOUNTER — Telehealth: Payer: Self-pay | Admitting: Family Medicine

## 2012-10-24 DIAGNOSIS — R079 Chest pain, unspecified: Secondary | ICD-10-CM

## 2012-10-26 ENCOUNTER — Ambulatory Visit (INDEPENDENT_AMBULATORY_CARE_PROVIDER_SITE_OTHER): Payer: BC Managed Care – PPO | Admitting: Cardiology

## 2012-10-26 ENCOUNTER — Encounter: Payer: Self-pay | Admitting: Cardiology

## 2012-10-26 VITALS — BP 122/79 | HR 100 | Ht 64.0 in | Wt 191.6 lb

## 2012-10-26 DIAGNOSIS — R002 Palpitations: Secondary | ICD-10-CM

## 2012-10-26 NOTE — Progress Notes (Signed)
HPI The patient presents as a new patient to me. She previously saw Dr. Andee Lineman in our practice. She has a history of reentrant AV nodal tachycardia which was ablated. She has not had any significant tachypalpitations. She rarely gets isolated beats. She was apparently in the emergency room at [facility] a few nights ago. She had chest discomfort. This was mid burning chest discomfort. It happens more when she leaned over. It radiated between her shoulder blades. I do not have access to these records but she was given Prilosec and told he was not cardiac discomfort. She does household chores without bringing on any symptoms. She otherwise denies any chest pressure, neck or arm discomfort. She's not reported palpitations, presyncope or syncope. She has no PND or orthopnea. Of note I did review a stress perfusion study from 2009 which was negative for any evidence of ischemia.  Allergies  Allergen Reactions  . Guaifenesin     MUCINEX REACTION: asthma attacks  . Oxycodone-Acetaminophen     TYLOX.  . Guaifenesin Er     Current Outpatient Prescriptions  Medication Sig Dispense Refill  . albuterol (PROAIR HFA) 108 (90 BASE) MCG/ACT inhaler Inhale 2 puffs into the lungs every 6 (six) hours as needed for wheezing.      Marland Kitchen albuterol (PROVENTIL) (2.5 MG/3ML) 0.083% nebulizer solution Take 2.5 mg by nebulization every 6 (six) hours as needed for wheezing or shortness of breath.      Marland Kitchen aspirin EC 81 MG tablet Take 81 mg by mouth daily.      . Azelastine-Fluticasone (DYMISTA) 137-50 MCG/ACT SUSP Place 2 sprays into the nose at bedtime.      . cyclobenzaprine (FLEXERIL) 10 MG tablet Take 5 mg by mouth 3 (three) times daily as needed for muscle spasms.       . diphenhydrAMINE (BENADRYL) 25 MG tablet Take 25 mg by mouth at bedtime as needed for allergies.      Marland Kitchen ibuprofen (ADVIL,MOTRIN) 800 MG tablet Take 800 mg by mouth every 8 (eight) hours as needed for pain.       . Insulin Aspart (NOVOLOG FLEXPEN [place])  Inject 22 Units into the skin 3 (three) times daily before meals.       . insulin detemir (LEVEMIR) 100 UNIT/ML injection Inject 50 Units into the skin at bedtime.       Marland Kitchen levothyroxine (SYNTHROID, LEVOTHROID) 75 MCG tablet Take 75 mcg by mouth daily before breakfast.      . LORazepam (ATIVAN) 0.5 MG tablet Take 1 tablet (0.5 mg total) by mouth every 8 (eight) hours. If needed for nerves  30 tablet  2  . metFORMIN (GLUCOPHAGE) 1000 MG tablet Take 1,000 mg by mouth 2 (two) times daily with a meal.        . metoprolol succinate (TOPROL-XL) 50 MG 24 hr tablet Take 50 mg by mouth 2 (two) times daily. Take with or immediately following a meal.      . mometasone-formoterol (DULERA) 200-5 MCG/ACT AERO Inhale 1-2 puffs into the lungs 2 (two) times daily.  1 Inhaler  0  . Multiple Vitamin (MULTIVITAMIN) tablet Take 1 tablet by mouth daily.      Marland Kitchen olmesartan-hydrochlorothiazide (BENICAR HCT) 40-25 MG per tablet Take 1 tablet by mouth daily.        . Omega-3 Fatty Acids (FISH OIL) 1000 MG CAPS Take 1 capsule by mouth daily. Take one by mouth daily       . primidone (MYSOLINE) 50 MG tablet Take 150  mg by mouth 2 (two) times daily.      . ranitidine (ZANTAC) 150 MG capsule Take 150 mg by mouth 2 (two) times daily.      . sertraline (ZOLOFT) 100 MG tablet Take 200 mg by mouth at bedtime.       No current facility-administered medications for this visit.    Past Medical History  Diagnosis Date  . Hyperlipidemia   . Hypertension   . Diabetes mellitus   . Asthma   . 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 Hillis Range, residual palpitations  . Vocal cord dysfunction   . Esophageal reflux   . Tremor, essential     Neurontin 600 mg qhs x 1 yr--no help.  Inderal prior to this was helpful for 10 yrs then stopped working.  . Anxiety   . Allergic rhinitis     Past Surgical History  Procedure Laterality Date  . Tonsillectomy    . Right breast cyst    . Left ankle  ligament repair    . Left elbow repair    . Partial hysterectomy    . Cholecystectomy    . Cesarean section    . Ablation for avnrt    . Colonoscopy  pre 2004 approx    Dr. Oretha Caprice  . Esophagogastroduodenoscopy (egd) with esophageal dilation  multiple    Dr. Oretha Caprice    ROS:  Dizzy.  Otherwise as stated in the HPI and negative for all other systems.  PHYSICAL EXAM BP 122/79  Pulse 100  Ht 5\' 4"  (1.626 m)  Wt 191 lb 9.6 oz (86.909 kg)  BMI 32.87 kg/m2 GENERAL:  Well appearing HEENT:  Pupils equal round and reactive, fundi not visualized, oral mucosa unremarkable NECK:  No jugular venous distention, waveform within normal limits, carotid upstroke brisk and symmetric, no bruits, no thyromegaly LYMPHATICS:  No cervical, inguinal adenopathy LUNGS:  Clear to auscultation bilaterally BACK:  No CVA tenderness CHEST:  Unremarkable HEART:  PMI not displaced or sustained,S1 and S2 within normal limits, no S3, no S4, no clicks, no rubs, no murmurs ABD:  Flat, positive bowel sounds normal in frequency in pitch, no bruits, no rebound, no guarding, no midline pulsatile mass, no hepatomegaly, no splenomegaly EXT:  2 plus pulses throughout, no edema, no cyanosis no clubbing SKIN:  No rashes no nodules NEURO:  Cranial nerves II through XII grossly intact, motor grossly intact throughout PSYCH:  Cognitively intact, oriented to person place and time   ASSESSMENT AND PLAN  AV NODE REENTRANT TACHYCARDIA:  No further therapy.    HTN:  The blood pressure is at target. No change in medications is indicated. We will continue with therapeutic lifestyle changes (TLC).  CHEST PAIN:  Likely GI.  I will bring the patient back for a POET (Plain Old Exercise Test). This will allow me to screen for obstructive coronary disease, risk stratify and very importantly provide a prescription for exercise.

## 2012-10-26 NOTE — Patient Instructions (Addendum)
The current medical regimen is effective;  continue present plan and medications.  Your physician has requested that you have an exercise tolerance test. For further information please visit [email or web address]. Please also follow instruction sheet, as given.  Follow up will be based on these results. 

## 2012-10-31 ENCOUNTER — Ambulatory Visit: Payer: BC Managed Care – PPO | Admitting: Family Medicine

## 2012-10-31 ENCOUNTER — Encounter: Payer: Self-pay | Admitting: Family Medicine

## 2012-10-31 ENCOUNTER — Ambulatory Visit (INDEPENDENT_AMBULATORY_CARE_PROVIDER_SITE_OTHER): Payer: BC Managed Care – PPO | Admitting: Family Medicine

## 2012-10-31 VITALS — BP 122/79 | HR 93 | Temp 98.8°F | Resp 18 | Ht 63.5 in | Wt 191.0 lb

## 2012-10-31 DIAGNOSIS — K222 Esophageal obstruction: Secondary | ICD-10-CM

## 2012-10-31 DIAGNOSIS — K219 Gastro-esophageal reflux disease without esophagitis: Secondary | ICD-10-CM

## 2012-10-31 DIAGNOSIS — K2970 Gastritis, unspecified, without bleeding: Secondary | ICD-10-CM

## 2012-10-31 DIAGNOSIS — IMO0001 Reserved for inherently not codable concepts without codable children: Secondary | ICD-10-CM

## 2012-10-31 DIAGNOSIS — K2990 Gastroduodenitis, unspecified, without bleeding: Secondary | ICD-10-CM

## 2012-10-31 MED ORDER — OMEPRAZOLE 20 MG PO CPDR: DELAYED_RELEASE_CAPSULE | ORAL | Status: DC

## 2012-10-31 NOTE — Progress Notes (Signed)
OFFICE NOTE  10/31/2012  CC:  Chief Complaint  Patient presents with  . Hospitalization Follow-up  . Abdominal Pain  . Diarrhea     HPI: Patient is a 62 y.o. Caucasian female who is here for 1 mo f/u depression, DM 2. Sugar had gone to 400+ and went to ED at [facility] and got IVF.   She still is very upset about being separated with husband, plus had recently had sweets b/c it was her birthday.  Then, she went back to [facility] for chest and abd complaints 1 week ago.  Work-up was done (no records available at this time). Dx'd with GERD. Describes stomach pain across entire upper abd region onset about 2 wks ago on, brought on by stress and helped some by milk.  Says omeprazole hasn't helped.  Occ difficulty with swallowing but not consistently.  Has hx of GER and esoph stricture and has had esoph dilatation, most recent was close to 10 yrs ago per her recollection. Up until most recent ED visit she was on zantac 150mg  bid.  They switched her to omeprazole 20mg  qd. She has had less appetite lately.  Eating hurts in upper abd some.  Some alternating constip/diarrhea lately.  No n/v at all.  Regarding her depression which has been worse lately secondary to split with her husband, she is struggling still but says she is also thinking about removing the restraining order against her husband and reconciling with him. We made referral to [facility] last o/v and due to insurance problems she ended up returning to [facility] [place] [facility] b/c it was free.  However, she stopped doing this b/c she felt bad since she was likely going to reconcile with her husband.  Since ED visit her glucoses are much improved ("100s-200s").  Says humalog has been helping since we added this a couple of months ago.  Pertinent PMH:  Past Medical History  Diagnosis Date  . Hyperlipidemia   . Hypertension   . Diabetes mellitus   . Asthma   . 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 Hillis Range, residual palpitations  . Vocal cord dysfunction   . Esophageal reflux   . Tremor, essential     Neurontin 600 mg qhs x 1 yr--no help.  Inderal prior to this was helpful for 10 yrs then stopped working.  . Anxiety   . Allergic rhinitis    Past Surgical History  Procedure Laterality Date  . Tonsillectomy    . Right breast cyst    . Left ankle ligament repair    . Left elbow repair    . Partial hysterectomy    . Cholecystectomy    . Cesarean section    . Ablation for avnrt    . Colonoscopy  pre 2004 approx    Dr. Oretha Caprice  . Esophagogastroduodenoscopy (egd) with esophageal dilation  multiple    Dr. Oretha Caprice   Past family and social history reviewed and there are no changes since the patient's last office visit with me.  MEDS:  Outpatient Prescriptions Prior to Visit  Medication Sig Dispense Refill  . albuterol (PROAIR HFA) 108 (90 BASE) MCG/ACT inhaler Inhale 2 puffs into the lungs every 6 (six) hours as needed for wheezing.      Marland Kitchen albuterol (PROVENTIL) (2.5 MG/3ML) 0.083% nebulizer solution Take 2.5 mg by nebulization every 6 (six) hours as needed for wheezing or shortness of breath.      Marland Kitchen aspirin EC  81 MG tablet Take 81 mg by mouth daily.      . Azelastine-Fluticasone (DYMISTA) 137-50 MCG/ACT SUSP Place 2 sprays into the nose at bedtime.      . cyclobenzaprine (FLEXERIL) 10 MG tablet Take 5 mg by mouth 3 (three) times daily as needed for muscle spasms.       . diphenhydrAMINE (BENADRYL) 25 MG tablet Take 25 mg by mouth at bedtime as needed for allergies.      Marland Kitchen ibuprofen (ADVIL,MOTRIN) 800 MG tablet Take 800 mg by mouth every 8 (eight) hours as needed for pain.       . Insulin Aspart (NOVOLOG FLEXPEN [place]) Inject 22 Units into the skin 3 (three) times daily before meals.       . insulin detemir (LEVEMIR) 100 UNIT/ML injection Inject 50 Units into the skin at bedtime.       Marland Kitchen levothyroxine (SYNTHROID, LEVOTHROID) 75 MCG tablet Take 75 mcg by mouth daily before  breakfast.      . LORazepam (ATIVAN) 0.5 MG tablet Take 1 tablet (0.5 mg total) by mouth every 8 (eight) hours. If needed for nerves  30 tablet  2  . metFORMIN (GLUCOPHAGE) 1000 MG tablet Take 1,000 mg by mouth 2 (two) times daily with a meal.        . metoprolol succinate (TOPROL-XL) 50 MG 24 hr tablet Take 50 mg by mouth 2 (two) times daily. Take with or immediately following a meal.      . mometasone-formoterol (DULERA) 200-5 MCG/ACT AERO Inhale 1-2 puffs into the lungs 2 (two) times daily.  1 Inhaler  0  . Multiple Vitamin (MULTIVITAMIN) tablet Take 1 tablet by mouth daily.      Marland Kitchen olmesartan-hydrochlorothiazide (BENICAR HCT) 40-25 MG per tablet Take 1 tablet by mouth daily.        . Omega-3 Fatty Acids (FISH OIL) 1000 MG CAPS Take 1 capsule by mouth daily. Take one by mouth daily       . primidone (MYSOLINE) 50 MG tablet Take 150 mg by mouth 2 (two) times daily.      . ranitidine (ZANTAC) 150 MG capsule Take 150 mg by mouth 2 (two) times daily.      . sertraline (ZOLOFT) 100 MG tablet Take 200 mg by mouth at bedtime.       No facility-administered medications prior to visit.    PE: Blood pressure 122/79, pulse 93, temperature 98.8 F (37.1 C), temperature source Temporal, resp. rate 18, height 5' 3.5" (1.613 m), weight 191 lb (86.637 kg), SpO2 98.00%. Gen: Alert, well appearing.  Patient is oriented to person, place, time, and situation. CV: RRR LUNGS: CTA bilat, nonlabored resps. ABD: soft, nondistended but rotund/obese.  Mild diffuse TTP across entire epigastric region but most prominent in midline/subxyphoid region.  No mass or palpable HSM.  BS normal.  IMPRESSION AND PLAN:  1) Recurrent GERD/gastritis, possibly some esoph stricture recurrence as well. Increase omeprazole to 20mg  bid and we'll get her back in to see her GI MD at [facility] ASAP. No zantac at this time.  2) DM 2, control erratic at times due to dietary noncompliance and acute stress. Overall I think  she is much improved over the last 2 mo since getting on mealtime insulin + levemir hs regimen. Continue current dosing (50 Levemir hs, 22 U mealtime insulin with each meal). She is going to work on arranging an eye exam.   Next o/v in 1 mo will do repeat HbA1c, also foot  exam and urine microalb/cr.  3) Depression, mostly situational.  I think she is going to get back with her husband. No change in meds.  Gave pt emotional encouragement today.  FOLLOW UP: 27mo

## 2012-11-01 ENCOUNTER — Telehealth: Payer: Self-pay | Admitting: Family Medicine

## 2012-11-01 DIAGNOSIS — K222 Esophageal obstruction: Secondary | ICD-10-CM | POA: Insufficient documentation

## 2012-11-01 DIAGNOSIS — K2970 Gastritis, unspecified, without bleeding: Secondary | ICD-10-CM | POA: Insufficient documentation

## 2012-11-01 DIAGNOSIS — K2990 Gastroduodenitis, unspecified, without bleeding: Secondary | ICD-10-CM | POA: Insufficient documentation

## 2012-11-01 NOTE — Telephone Encounter (Signed)
Patient wanted to let Dr. Milinda Cave know that her "NPO sugars" were 141 this morning.

## 2012-11-01 NOTE — Telephone Encounter (Signed)
Noted  

## 2012-11-02 ENCOUNTER — Ambulatory Visit: Payer: BC Managed Care – PPO | Admitting: Family Medicine

## 2012-11-03 ENCOUNTER — Ambulatory Visit (INDEPENDENT_AMBULATORY_CARE_PROVIDER_SITE_OTHER): Payer: BC Managed Care – PPO | Admitting: Gastroenterology

## 2012-11-03 ENCOUNTER — Encounter: Payer: Self-pay | Admitting: Gastroenterology

## 2012-11-03 VITALS — BP 124/75 | HR 95 | Temp 98.2°F | Ht 64.5 in | Wt 190.4 lb

## 2012-11-03 DIAGNOSIS — K5900 Constipation, unspecified: Secondary | ICD-10-CM | POA: Insufficient documentation

## 2012-11-03 DIAGNOSIS — K219 Gastro-esophageal reflux disease without esophagitis: Secondary | ICD-10-CM | POA: Insufficient documentation

## 2012-11-03 DIAGNOSIS — K589 Irritable bowel syndrome without diarrhea: Secondary | ICD-10-CM | POA: Insufficient documentation

## 2012-11-03 MED ORDER — DEXLANSOPRAZOLE 60 MG PO CPDR: 60.0000 mg | DELAYED_RELEASE_CAPSULE | Freq: Every day | ORAL | Status: DC

## 2012-11-03 MED ORDER — LINACLOTIDE 145 MCG PO CAPS: 145.0000 ug | ORAL_CAPSULE | Freq: Every day | ORAL | Status: DC

## 2012-11-03 NOTE — Assessment & Plan Note (Signed)
With history of IBS, no concerning features. Trial Linzess 145 mcg daily. Next colonoscopy due in Oct 2015.

## 2012-11-03 NOTE — Assessment & Plan Note (Signed)
62 year old female with severe GERD despite Prilosec BID and Zantac BID, associated with epigastric and LUQ pain for several weeks. Possible incidence of melena in the setting of intermittent Ibuprofen use, although she states NSAIDs are rare. She has been under significant stress at home and reports loss of appetite and self-reported weight loss of 30 lbs. She does not appear acutely ill, and likely refractory GERD is secondary to diet and behavior patterns; however, abdominal pain concerning for gastritis or PUD. Gallbladder absent. She does note sensation of food "sticking at xiphoid process"; history of [name]'s ring s/p dilation in 2003, which was the date of her last upper GI evaluation.  At this point, we will trial Dexilant and proceed with EGD+/-ED with Dr. Jena Gauss. The risks and benefits were discussed with verbalization of understanding by patient.  Avoid NSAIDs

## 2012-11-03 NOTE — Patient Instructions (Addendum)
For now, stop Prilosec and Zantac. Start taking Dexilant 1 capsule each morning. This is for reflux. I have provided samples and sent a prescription to your pharmacy.  For your bowel habits: start taking Linzess 1 capsule each morning, 30 minutes before breakfast. It must be on an empty stomach to avoid diarrhea. This is to help regulate your bowel habits.  We have scheduled you for an upper endoscopy and possible dilation with Dr. Jena Gauss in the near future.  Your next colonoscopy is due in 2015.

## 2012-11-03 NOTE — Progress Notes (Signed)
  Primary Care Physician:  MCGOWEN,PHILIP H, MD Primary Gastroenterologist:  Dr. [name]   Chief Complaint  Patient presents with  . Gastrophageal Reflux    HPI:   62-year-old female presents today at the request of Dr. Philip McGowen secondary to recurrent dysphagia, GERD. She has a history of [name]'s ring s/p dilation in Dec 2003 by Dr. [name]. She is not due for a colonoscopy until Oct 2015.   On Prilosec 20 mg BID. 150 mg Ranitidine BID. No improvement in symptoms. Notes breakthrough reflux. Epigastric/LUQ pain present for about 1-2 weeks, GERD 3-4 weeks. Abdominal pain persistent. Drinking coffee flared symptoms this morning. Thinks she may have seen black,tarry stool on one occasion. Feels like food gets stuck right at the "xiphoid process" about twice a month. Occasional Ibuprofen. No aspirin powders. Notes loss of appetite. States since March she has lost 30 lbs; husband had open heart surgery, causing significant stress. No N/V.   IBS "acting up". Under enormous amount of stress. Denies profuse diarrhea. Goes for a few days without a BM then will have a "session with the commode" ending with loose stool. Trying to avoid foods that "set her off". Notes lower abdominal pressure secondary to constipation. Trends towards constipation.   Past Medical History  Diagnosis Date  . Hyperlipidemia   . Hypertension   . Diabetes mellitus   . Asthma   . 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 James Allred, residual palpitations  . Vocal cord dysfunction   . Esophageal reflux   . Tremor, essential     Neurontin 600 mg qhs x 1 yr--no help.  Inderal prior to this was helpful for 10 yrs then stopped working.  . Anxiety   . Allergic rhinitis     Past Surgical History  Procedure Laterality Date  . Tonsillectomy    . Right breast cyst      benign  . Left ankle ligament repair    . Left elbow repair    . Partial hysterectomy    . Cholecystectomy    .  Cesarean section    . Ablation for avnrt    . Colonoscopy  01/16/2004    RMR:Normal rectum/colon  . Esophagogastroduodenoscopy (egd) with esophageal dilation  04/03/2002    RMR:Schatski's ring, otherwise normal esophagus, status post dilation with 54 F/Normal stomach    Current Outpatient Prescriptions  Medication Sig Dispense Refill  . albuterol (PROAIR HFA) 108 (90 BASE) MCG/ACT inhaler Inhale 2 puffs into the lungs every 6 (six) hours as needed for wheezing.      . albuterol (PROVENTIL) (2.5 MG/3ML) 0.083% nebulizer solution Take 2.5 mg by nebulization every 6 (six) hours as needed for wheezing or shortness of breath.      . aspirin EC 81 MG tablet Take 81 mg by mouth daily.      . Azelastine-Fluticasone (DYMISTA) 137-50 MCG/ACT SUSP Place 2 sprays into the nose at bedtime.      . cyclobenzaprine (FLEXERIL) 10 MG tablet Take 5 mg by mouth 3 (three) times daily as needed for muscle spasms.       . Insulin Aspart (NOVOLOG FLEXPEN [place]) Inject 22 Units into the skin 3 (three) times daily before meals.       . insulin detemir (LEVEMIR) 100 UNIT/ML injection Inject 50 Units into the skin at bedtime.       . levothyroxine (SYNTHROID, LEVOTHROID) 75 MCG tablet Take 75 mcg by mouth daily before breakfast.      .   LORazepam (ATIVAN) 0.5 MG tablet Take 1 tablet (0.5 mg total) by mouth every 8 (eight) hours. If needed for nerves  30 tablet  2  . metFORMIN (GLUCOPHAGE) 1000 MG tablet Take 1,000 mg by mouth 2 (two) times daily with a meal.        . metoprolol succinate (TOPROL-XL) 50 MG 24 hr tablet Take 50 mg by mouth 2 (two) times daily. Take with or immediately following a meal.      . olmesartan-hydrochlorothiazide (BENICAR HCT) 40-25 MG per tablet Take 1 tablet by mouth daily.        . omeprazole (PRILOSEC) 20 MG capsule 1 tab po bid  60 capsule  1  . primidone (MYSOLINE) 50 MG tablet Take 150 mg by mouth 2 (two) times daily.      . ranitidine (ZANTAC) 150 MG capsule Take 150 mg by mouth 2 (two)  times daily.      . sertraline (ZOLOFT) 100 MG tablet Take 200 mg by mouth at bedtime.      . dexlansoprazole (DEXILANT) 60 MG capsule Take 1 capsule (60 mg total) by mouth daily.  30 capsule  3  . Linaclotide (LINZESS) 145 MCG CAPS Take 1 capsule (145 mcg total) by mouth daily.  30 capsule  3   No current facility-administered medications for this visit.    Allergies as of 11/03/2012 - Review Complete 11/03/2012  Allergen Reaction Noted  . Guaifenesin    . Oxycodone-acetaminophen  05/11/2011  . Guaifenesin er      Family History  Problem Relation Age of Onset  . Lung cancer Father     DIED AGE 58 LUNG CA  . COPD Mother     DIED AGE 88,MRSA,COPD,PNEUMONIA  . Pneumonia Mother     DIED AGE 88,MRSA,COPD,PNEUMONIA  . COPD Brother     AGE 60  . Heart disease Sister     DIED AGE 62, SMOKER,?HEART  . Colon cancer Neg Hx     History   Social History  . Marital Status: Married    Spouse Name: N/A    Number of Children: N/A  . Years of Education: N/A   Occupational History  . Not on file.   Social History Main Topics  . Smoking status: Former Smoker -- 0.30 packs/day for 10 years    Types: Cigarettes    Quit date: 04/06/1985  . Smokeless tobacco: Never Used  . Alcohol Use: No  . Drug Use: No  . Sexually Active: No   Other Topics Concern  . Not on file   Social History Narrative   Married, 1 daughte, living.  Lives with husband in [place], [place].   1 child died brain tumor at age 7.   Two grandchildren.   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   Retired 2011.   No tobacco.   Alcohol: none in 30 yrs.  Distant history of heavy use.   No drug use.    Review of Systems: Negative unless mentioned in HPI   Physical Exam: BP 124/75  Pulse 95  Temp(Src) 98.2 F (36.8 C) (Oral)  Ht 5' 4.5" (1.638 m)  Wt 190 lb 6.4 oz (86.365 kg)  BMI 32.19 kg/m2 General:   Alert and oriented. Pleasant and cooperative. Well-nourished and well-developed.   Head:  Normocephalic and atraumatic. Eyes:  Without icterus, sclera clear and conjunctiva pink.  Ears:  Normal auditory acuity. Nose:  No deformity, discharge,  or lesions. Mouth:  No deformity or lesions, oral   mucosa pink.  Neck:  Supple, without mass or thyromegaly. Lungs:  Clear to auscultation bilaterally. No wheezes, rales, or rhonchi. No distress.  Heart:  S1, S2 present without murmurs appreciated.  Abdomen:  +BS, soft, mild TTP epigastric region/LUQ and non-distended. No HSM noted. No guarding or rebound. No masses appreciated.  Rectal:  Deferred  Msk:  Symmetrical without gross deformities. Normal posture. Extremities:  Without clubbing or edema. Neurologic:  Alert and  oriented x4;  grossly normal neurologically. Skin:  Intact without significant lesions or rashes. Cervical Nodes:  No significant cervical adenopathy. Psych:  Alert and cooperative. Normal mood and affect.    

## 2012-11-04 ENCOUNTER — Other Ambulatory Visit: Payer: Self-pay | Admitting: Family Medicine

## 2012-11-04 MED ORDER — METFORMIN HCL 1000 MG PO TABS: 1000.0000 mg | ORAL_TABLET | Freq: Two times a day (BID) | ORAL | Status: DC

## 2012-11-05 ENCOUNTER — Encounter (HOSPITAL_COMMUNITY): Payer: Self-pay | Admitting: Emergency Medicine

## 2012-11-05 ENCOUNTER — Emergency Department (HOSPITAL_COMMUNITY)
Admission: EM | Admit: 2012-11-05 | Discharge: 2012-11-05 | Disposition: A | Discharge status: Home / Self Care | Payer: BC Managed Care – PPO | Attending: Emergency Medicine | Admitting: Emergency Medicine

## 2012-11-05 ENCOUNTER — Encounter (HOSPITAL_COMMUNITY): Payer: Self-pay

## 2012-11-05 DIAGNOSIS — H9209 Otalgia, unspecified ear: Secondary | ICD-10-CM | POA: Insufficient documentation

## 2012-11-05 DIAGNOSIS — Z7982 Long term (current) use of aspirin: Secondary | ICD-10-CM | POA: Insufficient documentation

## 2012-11-05 DIAGNOSIS — G8929 Other chronic pain: Secondary | ICD-10-CM | POA: Insufficient documentation

## 2012-11-05 DIAGNOSIS — Z87891 Personal history of nicotine dependence: Secondary | ICD-10-CM | POA: Insufficient documentation

## 2012-11-05 DIAGNOSIS — R059 Cough, unspecified: Secondary | ICD-10-CM | POA: Insufficient documentation

## 2012-11-05 DIAGNOSIS — Z79899 Other long term (current) drug therapy: Secondary | ICD-10-CM | POA: Insufficient documentation

## 2012-11-05 DIAGNOSIS — R05 Cough: Secondary | ICD-10-CM | POA: Insufficient documentation

## 2012-11-05 DIAGNOSIS — Z8669 Personal history of other diseases of the nervous system and sense organs: Secondary | ICD-10-CM | POA: Insufficient documentation

## 2012-11-05 DIAGNOSIS — J3489 Other specified disorders of nose and nasal sinuses: Secondary | ICD-10-CM | POA: Insufficient documentation

## 2012-11-05 DIAGNOSIS — Z862 Personal history of diseases of the blood and blood-forming organs and certain disorders involving the immune mechanism: Secondary | ICD-10-CM | POA: Insufficient documentation

## 2012-11-05 DIAGNOSIS — Z8709 Personal history of other diseases of the respiratory system: Secondary | ICD-10-CM | POA: Insufficient documentation

## 2012-11-05 DIAGNOSIS — F411 Generalized anxiety disorder: Secondary | ICD-10-CM | POA: Insufficient documentation

## 2012-11-05 DIAGNOSIS — Z8679 Personal history of other diseases of the circulatory system: Secondary | ICD-10-CM | POA: Insufficient documentation

## 2012-11-05 DIAGNOSIS — Z794 Long term (current) use of insulin: Secondary | ICD-10-CM | POA: Insufficient documentation

## 2012-11-05 DIAGNOSIS — K219 Gastro-esophageal reflux disease without esophagitis: Secondary | ICD-10-CM | POA: Insufficient documentation

## 2012-11-05 DIAGNOSIS — H9319 Tinnitus, unspecified ear: Secondary | ICD-10-CM | POA: Insufficient documentation

## 2012-11-05 DIAGNOSIS — Z8639 Personal history of other endocrine, nutritional and metabolic disease: Secondary | ICD-10-CM | POA: Insufficient documentation

## 2012-11-05 DIAGNOSIS — E119 Type 2 diabetes mellitus without complications: Secondary | ICD-10-CM | POA: Insufficient documentation

## 2012-11-05 DIAGNOSIS — H9202 Otalgia, left ear: Secondary | ICD-10-CM

## 2012-11-05 DIAGNOSIS — J029 Acute pharyngitis, unspecified: Secondary | ICD-10-CM | POA: Insufficient documentation

## 2012-11-05 DIAGNOSIS — J45909 Unspecified asthma, uncomplicated: Secondary | ICD-10-CM | POA: Insufficient documentation

## 2012-11-05 DIAGNOSIS — R109 Unspecified abdominal pain: Secondary | ICD-10-CM | POA: Insufficient documentation

## 2012-11-05 DIAGNOSIS — R42 Dizziness and giddiness: Secondary | ICD-10-CM | POA: Insufficient documentation

## 2012-11-05 DIAGNOSIS — I10 Essential (primary) hypertension: Secondary | ICD-10-CM | POA: Insufficient documentation

## 2012-11-05 MED ORDER — MECLIZINE HCL 25 MG PO TABS: 25.0000 mg | ORAL_TABLET | Freq: Four times a day (QID) | ORAL | Status: DC | PRN

## 2012-11-05 MED ORDER — INSULIN LISPRO PROT & LISPRO (75-25 MIX) 100 UNIT/ML KWIKPEN: 22.0000 [IU] | PEN_INJECTOR | Freq: Three times a day (TID) | SUBCUTANEOUS | Status: DC

## 2012-11-05 NOTE — ED Notes (Signed)
Pt c/o waking up with dizziness/left ear pain and sore throat at 0200 this am. Pt also concerned about getting a prescription for a humalog pen.

## 2012-11-05 NOTE — ED Provider Notes (Signed)
CSN: [id number]     Arrival date & time 11/05/12  1140 History    This chart was scribed for No att. providers found,  by Ashley Jacobs, ED Scribe. The patient was seen in room APA17/APA17 and the patient's care was started at 1:02 PM   Chief Complaint  Patient presents with  . Dizziness  . Otalgia  . Sore Throat    Patient is a 62 y.o. female presenting with ear pain and pharyngitis.  Otalgia Associated symptoms: abdominal pain, congestion and sore throat   Associated symptoms: no diarrhea and no fever   Sore Throat Associated symptoms include abdominal pain. Pertinent negatives include no chest pain.   HPI Comments: [name] is a 62 y.o. female who presents to the Emergency Department complaining of dizziness, left otalagia, and sore throat. Pt reports the dizziness present over the last two months she had 1-2 episodes per week episodes of otalgia and vertigo dizziness last several minutes at a time worse with position changes. Pt reports waking up the morning pta with a sore throat. Pt report constant chronic tinnitis and denies any hearing loss. Pt reports that she has an endoscopy tomorrow. The dizzy spell tends to last several minutes with accompanied with motion sensation and denies nausea or light headedness during these events. Pt reports that she have one episode a day however the one last night lasted 15 min.  Pt mentions she has a hx supraventricular tachycardia. PT denies cp, cough, diarrhea,. Pt reports mild chronic stable abdominal pain and chronic cough and nasal congestion. No trauma, headache, focal weak/numb/ataxia/incoordination/or change speech/vision/swallow/understanding.  Past Medical History  Diagnosis Date  . Hyperlipidemia   . Hypertension   . Diabetes mellitus   . Asthma   . 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 Hillis Range, residual palpitations  . Vocal cord dysfunction   . Esophageal reflux   . Tremor,  essential     Neurontin 600 mg qhs x 1 yr--no help.  Inderal prior to this was helpful for 10 yrs then stopped working.  . Anxiety   . Allergic rhinitis    Past Surgical History  Procedure Laterality Date  . Tonsillectomy    . Right breast cyst      benign  . Left ankle ligament repair    . Left elbow repair    . Partial hysterectomy    . Cholecystectomy    . Cesarean section    . Ablation for avnrt    . Colonoscopy  01/16/2004    WUJ:WJXBJY rectum/colon  . Esophagogastroduodenoscopy (egd) with esophageal dilation  04/03/2002    NWG:[id number]'M ring, otherwise normal esophagus, status post dilation with 56 F/Normal stomach   Family History  Problem Relation Age of Onset  . Lung cancer Father     DIED AGE 2 LUNG CA  . COPD Mother     DIED AGE 8,MRSA,COPD,PNEUMONIA  . Pneumonia Mother     DIED AGE 8,MRSA,COPD,PNEUMONIA  . COPD Brother     AGE 18  . Heart disease Sister     DIED AGE 33, SMOKER,?HEART  . Colon cancer Neg Hx    History  Substance Use Topics  . Smoking status: Former Smoker -- 0.30 packs/day for 10 years    Types: Cigarettes    Quit date: 04/06/1985  . Smokeless tobacco: Never Used  . Alcohol Use: No   OB History   Grav Para Term Preterm Abortions TAB SAB Ect Mult  Living                 Review of Systems  Constitutional: Negative for fever and appetite change.  HENT: Positive for ear pain (left), congestion and sore throat.   Cardiovascular: Negative for chest pain.  Gastrointestinal: Positive for abdominal pain. Negative for nausea and diarrhea.  Neurological: Positive for dizziness.  Psychiatric/Behavioral: Negative for confusion.  10 Systems reviewed and are negative for acute change except as noted in the HPI.  Allergies  Guaifenesin; Oxycodone-acetaminophen; and Guaifenesin er  Home Medications   Current Outpatient Rx  Name  Route  Sig  Dispense  Refill  . albuterol (PROAIR HFA) 108 (90 BASE) MCG/ACT inhaler   Inhalation   Inhale 2  puffs into the lungs every 6 (six) hours as needed for wheezing.         Marland Kitchen albuterol (PROVENTIL) (2.5 MG/3ML) 0.083% nebulizer solution   Nebulization   Take 2.5 mg by nebulization every 6 (six) hours as needed for wheezing or shortness of breath.         Marland Kitchen aspirin EC 81 MG tablet   Oral   Take 81 mg by mouth daily.         . Azelastine-Fluticasone (DYMISTA) 137-50 MCG/ACT SUSP   Nasal   Place 2 sprays into the nose at bedtime.         . cyclobenzaprine (FLEXERIL) 10 MG tablet   Oral   Take 5 mg by mouth 3 (three) times daily as needed for muscle spasms.          Marland Kitchen dexlansoprazole (DEXILANT) 60 MG capsule   Oral   Take 1 capsule (60 mg total) by mouth daily.   30 capsule   3   . DiphenhydrAMINE HCl (BENADRYL PO)   Oral   Take 1 tablet by mouth at bedtime as needed (allergies).         . Insulin Lispro Prot & Lispro (HUMALOG MIX 75/25 KWIKPEN [place])   Subcutaneous   Inject 22 Units into the skin 3 (three) times daily before meals.         Marland Kitchen levothyroxine (SYNTHROID, LEVOTHROID) 75 MCG tablet   Oral   Take 75 mcg by mouth daily before breakfast.         . mometasone (NASONEX) 50 MCG/ACT nasal spray   Nasal   Place 2 sprays into the nose daily.         . insulin detemir (LEVEMIR) 100 UNIT/ML injection   Subcutaneous   Inject 50 Units into the skin at bedtime.          . Insulin Lispro Prot & Lispro (HUMALOG MIX 75/25 KWIKPEN) (75-25) 100 UNIT/ML SUPN   Subcutaneous   Inject 22 Units into the skin 3 (three) times daily.   1 pen   0   . Linaclotide (LINZESS) 145 MCG CAPS   Oral   Take 1 capsule (145 mcg total) by mouth daily.   30 capsule   3   . LORazepam (ATIVAN) 0.5 MG tablet   Oral   Take 1 tablet (0.5 mg total) by mouth every 8 (eight) hours. If needed for nerves   30 tablet   2   . meclizine (ANTIVERT) 25 MG tablet   Oral   Take 1 tablet (25 mg total) by mouth every 6 (six) hours as needed for dizziness.   20 tablet   0   .  metFORMIN (GLUCOPHAGE) 1000 MG tablet   Oral   Take  1 tablet (1,000 mg total) by mouth 2 (two) times daily with a meal.   60 tablet   3   . metoprolol succinate (TOPROL-XL) 50 MG 24 hr tablet   Oral   Take 50 mg by mouth 2 (two) times daily. Take with or immediately following a meal.         . mometasone-formoterol (DULERA) 100-5 MCG/ACT AERO   Inhalation   Inhale 1 puff into the lungs 2 (two) times daily.         Marland Kitchen olmesartan-hydrochlorothiazide (BENICAR HCT) 40-25 MG per tablet   Oral   Take 1 tablet by mouth daily.           . primidone (MYSOLINE) 50 MG tablet   Oral   Take 150 mg by mouth 2 (two) times daily.         . promethazine-codeine (PHENERGAN WITH CODEINE) 6.25-10 MG/5ML syrup   Oral   Take 5 mLs by mouth 2 (two) times daily as needed for cough.         . Pseudoeph-Doxylamine-DM-APAP (NYQUIL PO)   Oral   Take 5 mLs by mouth at bedtime as needed (sinus drainage).         . sertraline (ZOLOFT) 100 MG tablet   Oral   Take 200 mg by mouth at bedtime.          BP 121/76  Pulse 88  Temp(Src) 98.8 F (37.1 C)  Resp 18  Ht 5' 4.5" (1.638 m)  Wt 189 lb (85.73 kg)  BMI 31.95 kg/m2  SpO2 100% Physical Exam  Nursing note and vitals reviewed. Constitutional:  Awake, alert, nontoxic appearance with baseline speech for patient.  HENT:  Head: Atraumatic.  Mouth/Throat: Oropharynx is clear and moist. No oropharyngeal exudate.  TM clear bilaterally Several beats of lateral nystagmus Neg test of skew No multi-directional Nystagmus   Eyes: EOM are normal. Pupils are equal, round, and reactive to light. Right eye exhibits no discharge. Left eye exhibits no discharge.  Neck: Neck supple.  Cardiovascular: Normal rate and regular rhythm.   No murmur heard. Pulmonary/Chest: Effort normal and breath sounds normal. No stridor. No respiratory distress. She has no wheezes. She has no rales. She exhibits no tenderness.  Abdominal: Soft. Bowel sounds are  normal. She exhibits no mass. There is tenderness (minmal diffused abdominal pain ). There is no rebound.  Minimal chronic baseline tenderness  Musculoskeletal: She exhibits no tenderness.  Baseline ROM, moves extremities with no obvious new focal weakness.  Lymphadenopathy:    She has no cervical adenopathy.  Neurological: She is alert.  Awake, alert, cooperative and aware of situation; motor strength bilaterally; sensation normal to light touch bilaterally; peripheral visual fields full to confrontation; no facial asymmetry; tongue midline; major cranial nerves appear intact; no pronator drift, normal finger to nose bilaterally, baseline gait without new ataxia.  Skin: No rash noted.  Psychiatric: She has a normal mood and affect.    ED Course  DIAGNOSTIC STUDIES: Oxygen Saturation is 100% on room air, normal by my interpretation.    COORDINATION OF CARE: 1:14 PM Patient understands and agrees with initial ED impression and plan with expectations set for ED visit. Patient informed of clinical course, understand medical decision-making process, and agree with plan.  Procedures (including critical care time)  Labs Reviewed - No data to display No results found. 1. Otalgia, left   2. Vertigo     MDM  I doubt any other EMC precluding discharge at this time  including, but not necessarily limited to the following:SAH, CVA, SBI. I personally performed the services described in this documentation, which was scribed in my presence. The recorded information has been reviewed and is accurate.    Hurman Horn, MD 11/05/12 2009

## 2012-11-05 NOTE — ED Notes (Signed)
Pt c/o sore throat, left earache, sinus issues, dizziness that started a few days ago, is concerned because pt is scheduled to have endoscopy performed Tuesday, pt also concerned because her pcp would not refill her insulin pen,

## 2012-11-05 NOTE — ED Notes (Signed)
Dr. Bednar at bedside,  

## 2012-11-08 ENCOUNTER — Encounter (HOSPITAL_COMMUNITY): Payer: Self-pay | Admitting: *Deleted

## 2012-11-08 ENCOUNTER — Ambulatory Visit (HOSPITAL_COMMUNITY)
Admission: RE | Admit: 2012-11-08 | Discharge: 2012-11-08 | Disposition: A | Discharge status: Home / Self Care | Payer: BC Managed Care – PPO | Source: Ambulatory Visit | Attending: Internal Medicine | Admitting: Internal Medicine

## 2012-11-08 ENCOUNTER — Encounter (HOSPITAL_COMMUNITY)
Admission: RE | Disposition: A | Discharge status: Home / Self Care | Payer: Self-pay | Source: Ambulatory Visit | Attending: Internal Medicine

## 2012-11-08 DIAGNOSIS — K219 Gastro-esophageal reflux disease without esophagitis: Secondary | ICD-10-CM

## 2012-11-08 DIAGNOSIS — R1310 Dysphagia, unspecified: Secondary | ICD-10-CM

## 2012-11-08 DIAGNOSIS — K5900 Constipation, unspecified: Secondary | ICD-10-CM

## 2012-11-08 DIAGNOSIS — K449 Diaphragmatic hernia without obstruction or gangrene: Secondary | ICD-10-CM

## 2012-11-08 DIAGNOSIS — K222 Esophageal obstruction: Secondary | ICD-10-CM

## 2012-11-08 HISTORY — DX: Hypo-osmolality and hyponatremia: E87.1

## 2012-11-08 HISTORY — PX: ESOPHAGOGASTRODUODENOSCOPY (EGD) WITH ESOPHAGEAL DILATION: SHX5812

## 2012-11-08 LAB — GLUCOSE, CAPILLARY
Glucose-Capillary: 213 mg/dL — ABNORMAL HIGH (ref 70–99)
Glucose-Capillary: 171 mg/dL — ABNORMAL HIGH (ref 70–99)

## 2012-11-08 SURGERY — ESOPHAGOGASTRODUODENOSCOPY (EGD) WITH ESOPHAGEAL DILATION
Anesthesia: Moderate Sedation

## 2012-11-08 MED ORDER — ONDANSETRON HCL 4 MG/2ML IJ SOLN
INTRAMUSCULAR | Status: AC
  Filled 2012-11-08: qty 2

## 2012-11-08 MED ORDER — SODIUM CHLORIDE 0.9 % IV SOLN
INTRAVENOUS | Status: DC
  Administered 2012-11-08: 11:00:00 via INTRAVENOUS

## 2012-11-08 MED ORDER — BUTAMBEN-TETRACAINE-BENZOCAINE 2-2-14 % EX AERO
INHALATION_SPRAY | CUTANEOUS | Status: DC | PRN
  Administered 2012-11-08: 2 via TOPICAL

## 2012-11-08 MED ORDER — MEPERIDINE HCL 100 MG/ML IJ SOLN
INTRAMUSCULAR | Status: AC
  Filled 2012-11-08: qty 1

## 2012-11-08 MED ORDER — STERILE WATER FOR IRRIGATION IR SOLN
Status: DC | PRN
  Administered 2012-11-08: 12:00:00

## 2012-11-08 MED ORDER — ONDANSETRON HCL 4 MG/2ML IJ SOLN
INTRAMUSCULAR | Status: DC | PRN
  Administered 2012-11-08: 4 mg via INTRAVENOUS

## 2012-11-08 MED ORDER — MEPERIDINE HCL 100 MG/ML IJ SOLN
INTRAMUSCULAR | Status: DC | PRN
  Administered 2012-11-08: 25 mg via INTRAVENOUS
  Administered 2012-11-08: 50 mg via INTRAVENOUS

## 2012-11-08 MED ORDER — MIDAZOLAM HCL 5 MG/5ML IJ SOLN
INTRAMUSCULAR | Status: AC
  Filled 2012-11-08: qty 10

## 2012-11-08 MED ORDER — MIDAZOLAM HCL 5 MG/5ML IJ SOLN
INTRAMUSCULAR | Status: DC | PRN
  Administered 2012-11-08: 1 mg via INTRAVENOUS
  Administered 2012-11-08 (×2): 2 mg via INTRAVENOUS

## 2012-11-08 NOTE — Interval H&P Note (Signed)
History and Physical Interval Note:  11/08/2012 11:29 AM  [name]  has presented today for surgery, with the diagnosis of GERD and Constipation  The various methods of treatment have been discussed with the patient and family. After consideration of risks, benefits and other options for treatment, the patient has consented to  Procedure(s) with comments: ESOPHAGOGASTRODUODENOSCOPY (EGD) WITH ESOPHAGEAL DILATION (N/A) - 11:30 as a surgical intervention .  The patient's history has been reviewed, patient examined, no change in status, stable for surgery.  I have reviewed the patient's chart and labs.  Questions were answered to the patient's satisfaction.      EGD with possible esophageal dilation per plan.The risks, benefits, limitations, alternatives and imponderables have been reviewed with the patient. Potential for esophageal dilation, biopsy, etc. have also been reviewed.  Questions have been answered. All parties agreeable.   [name]

## 2012-11-08 NOTE — Op Note (Signed)
[facility] [address] [place], [address]   ENDOSCOPY PROCEDURE REPORT  PATIENT: [name], [name]  MR#: [id number] BIRTHDATE: 09/12/1950 , 62  yrs. old GENDER: Female ENDOSCOPIST: R.  Roetta Sessions, MD FACP FACG REFERRED BY:  Earley Favor, M.D. PROCEDURE DATE:  11/08/2012 PROCEDURE:     EGD with Elease Hashimoto dilation  INDICATIONS:    Recurrent esophageal dysphagia; history of known [name]'s ring  INFORMED CONSENT:   The risks, benefits, limitations, alternatives and imponderables have been discussed.  The potential for biopsy, esophogeal dilation, etc. have also been reviewed.  Questions have been answered.  All parties agreeable.  Please see the history and physical in the medical record for more information.  MEDICATIONS:      Versed 4 mg IV and Demerol 75 mg IV in divided doses. Zofran 4 mg IV. Cetacaine spray.  DESCRIPTION OF PROCEDURE:   The EG-[id number] ([id number])  endoscope was introduced through the mouth and advanced to the second portion of the duodenum without difficulty or limitations.  The mucosal surfaces were surveyed very carefully during advancement of the scope and upon withdrawal.  Retroflexion view of the proximal stomach and esophagogastric junction was performed.      FINDINGS: Noncritical [name]'s ring; otherwise, the remainder of the esophageal mucosa appeared normal. Stomach empty. Small hiatal hernia. Abnormal gastric mucosa. Patent pylorus. Normal first and second portion of the duodenum  THERAPEUTIC / DIAGNOSTIC MANEUVERS PERFORMED:  A 56 French Maloney dilator was passed to full insertion easily. A look back revealed her ring have been nicely ruptured. There was a superficial tear just above the EG junction. No apparent complication.   COMPLICATIONS:  None  IMPRESSION: [name]'s ring  -  Status post dilation as described above. Hiatal hernia.  RECOMMENDATIONS:  Continue Dexilant 60 mg daily. Office visit with [place] in  one year.  A separate issue, Linzess causing a little diarrhea. I've advised her to back off to 145 mcg every other day    _______________________________ R. Roetta Sessions, MD FACP [facility] eSigned:  R. Roetta Sessions, MD FACP [facility] 11/08/2012 12:07 PM     CC:  PATIENT NAME:  Polly, Barner MR#: [id number]

## 2012-11-08 NOTE — H&P (View-Only) (Signed)
Primary Care Physician:  Jeoffrey Massed, MD Primary Gastroenterologist:  Dr. Jena Gauss   Chief Complaint  Patient presents with  . Gastrophageal Reflux    HPI:   62 year old female presents today at the request of Dr. Nicoletta Ba secondary to recurrent dysphagia, GERD. She has a history of [name]'s ring s/p dilation in Dec 2003 by Dr. Jena Gauss. She is not due for a colonoscopy until Oct 2015.   On Prilosec 20 mg BID. 150 mg Ranitidine BID. No improvement in symptoms. Notes breakthrough reflux. Epigastric/LUQ pain present for about 1-2 weeks, GERD 3-4 weeks. Abdominal pain persistent. Drinking coffee flared symptoms this morning. Thinks she may have seen black,tarry stool on one occasion. Feels like food gets stuck right at the "xiphoid process" about twice a month. Occasional Ibuprofen. No aspirin powders. Notes loss of appetite. States since March she has lost 30 lbs; husband had open heart surgery, causing significant stress. No N/V.   IBS "acting up". Under enormous amount of stress. Denies profuse diarrhea. Goes for a few days without a BM then will have a "session with the commode" ending with loose stool. Trying to avoid foods that "set her off". Notes lower abdominal pressure secondary to constipation. Trends towards constipation.   Past Medical History  Diagnosis Date  . Hyperlipidemia   . Hypertension   . Diabetes mellitus   . Asthma   . 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 Hillis Range, residual palpitations  . Vocal cord dysfunction   . Esophageal reflux   . Tremor, essential     Neurontin 600 mg qhs x 1 yr--no help.  Inderal prior to this was helpful for 10 yrs then stopped working.  . Anxiety   . Allergic rhinitis     Past Surgical History  Procedure Laterality Date  . Tonsillectomy    . Right breast cyst      benign  . Left ankle ligament repair    . Left elbow repair    . Partial hysterectomy    . Cholecystectomy    .  Cesarean section    . Ablation for avnrt    . Colonoscopy  01/16/2004    ZOX:WRUEAV rectum/colon  . Esophagogastroduodenoscopy (egd) with esophageal dilation  04/03/2002    WUJ:[id number]'G ring, otherwise normal esophagus, status post dilation with 56 F/Normal stomach    Current Outpatient Prescriptions  Medication Sig Dispense Refill  . albuterol (PROAIR HFA) 108 (90 BASE) MCG/ACT inhaler Inhale 2 puffs into the lungs every 6 (six) hours as needed for wheezing.      Marland Kitchen albuterol (PROVENTIL) (2.5 MG/3ML) 0.083% nebulizer solution Take 2.5 mg by nebulization every 6 (six) hours as needed for wheezing or shortness of breath.      Marland Kitchen aspirin EC 81 MG tablet Take 81 mg by mouth daily.      . Azelastine-Fluticasone (DYMISTA) 137-50 MCG/ACT SUSP Place 2 sprays into the nose at bedtime.      . cyclobenzaprine (FLEXERIL) 10 MG tablet Take 5 mg by mouth 3 (three) times daily as needed for muscle spasms.       . Insulin Aspart (NOVOLOG FLEXPEN [place]) Inject 22 Units into the skin 3 (three) times daily before meals.       . insulin detemir (LEVEMIR) 100 UNIT/ML injection Inject 50 Units into the skin at bedtime.       Marland Kitchen levothyroxine (SYNTHROID, LEVOTHROID) 75 MCG tablet Take 75 mcg by mouth daily before breakfast.      .  LORazepam (ATIVAN) 0.5 MG tablet Take 1 tablet (0.5 mg total) by mouth every 8 (eight) hours. If needed for nerves  30 tablet  2  . metFORMIN (GLUCOPHAGE) 1000 MG tablet Take 1,000 mg by mouth 2 (two) times daily with a meal.        . metoprolol succinate (TOPROL-XL) 50 MG 24 hr tablet Take 50 mg by mouth 2 (two) times daily. Take with or immediately following a meal.      . olmesartan-hydrochlorothiazide (BENICAR HCT) 40-25 MG per tablet Take 1 tablet by mouth daily.        Marland Kitchen omeprazole (PRILOSEC) 20 MG capsule 1 tab po bid  60 capsule  1  . primidone (MYSOLINE) 50 MG tablet Take 150 mg by mouth 2 (two) times daily.      . ranitidine (ZANTAC) 150 MG capsule Take 150 mg by mouth 2 (two)  times daily.      . sertraline (ZOLOFT) 100 MG tablet Take 200 mg by mouth at bedtime.      Marland Kitchen dexlansoprazole (DEXILANT) 60 MG capsule Take 1 capsule (60 mg total) by mouth daily.  30 capsule  3  . Linaclotide (LINZESS) 145 MCG CAPS Take 1 capsule (145 mcg total) by mouth daily.  30 capsule  3   No current facility-administered medications for this visit.    Allergies as of 11/03/2012 - Review Complete 11/03/2012  Allergen Reaction Noted  . Guaifenesin    . Oxycodone-acetaminophen  05/11/2011  . Guaifenesin er      Family History  Problem Relation Age of Onset  . Lung cancer Father     DIED AGE 58 LUNG CA  . COPD Mother     DIED AGE 20,MRSA,COPD,PNEUMONIA  . Pneumonia Mother     DIED AGE 20,MRSA,COPD,PNEUMONIA  . COPD Brother     AGE 75  . Heart disease Sister     DIED AGE 52, SMOKER,?HEART  . Colon cancer Neg Hx     History   Social History  . Marital Status: Married    Spouse Name: N/A    Number of Children: N/A  . Years of Education: N/A   Occupational History  . Not on file.   Social History Main Topics  . Smoking status: Former Smoker -- 0.30 packs/day for 10 years    Types: Cigarettes    Quit date: 04/06/1985  . Smokeless tobacco: Never Used  . Alcohol Use: No  . Drug Use: No  . Sexually Active: No   Other Topics Concern  . Not on file   Social History Narrative   Married, 1 daughte, living.  Lives with husband in [place], [place].   1 child died brain tumor at age 44.   Two grandchildren.   Works at [employer], patient currently lost her job.   Retired 2011.   No tobacco.   Alcohol: none in 30 yrs.  Distant history of heavy use.   No drug use.    Review of Systems: Negative unless mentioned in HPI   Physical Exam: BP 124/75  Pulse 95  Temp(Src) 98.2 F (36.8 C) (Oral)  Ht 5' 4.5" (1.638 m)  Wt 190 lb 6.4 oz (86.365 kg)  BMI 32.19 kg/m2 General:   Alert and oriented. Pleasant and cooperative. Well-nourished and well-developed.   Head:  Normocephalic and atraumatic. Eyes:  Without icterus, sclera clear and conjunctiva pink.  Ears:  Normal auditory acuity. Nose:  No deformity, discharge,  or lesions. Mouth:  No deformity or lesions, oral  mucosa pink.  Neck:  Supple, without mass or thyromegaly. Lungs:  Clear to auscultation bilaterally. No wheezes, rales, or rhonchi. No distress.  Heart:  S1, S2 present without murmurs appreciated.  Abdomen:  +BS, soft, mild TTP epigastric region/LUQ and non-distended. No HSM noted. No guarding or rebound. No masses appreciated.  Rectal:  Deferred  Msk:  Symmetrical without gross deformities. Normal posture. Extremities:  Without clubbing or edema. Neurologic:  Alert and  oriented x4;  grossly normal neurologically. Skin:  Intact without significant lesions or rashes. Cervical Nodes:  No significant cervical adenopathy. Psych:  Alert and cooperative. Normal mood and affect.

## 2012-11-09 ENCOUNTER — Telehealth: Payer: Self-pay | Admitting: Internal Medicine

## 2012-11-09 ENCOUNTER — Encounter (HOSPITAL_COMMUNITY): Payer: Self-pay | Admitting: Internal Medicine

## 2012-11-09 NOTE — Telephone Encounter (Signed)
Spoke with patient-- Patient states she had Upper Endo done yesterday Patient says she is now coughing up thick yellow mucus Patient requesting to be seen by Dr. Maple Hudson  Patient scheduled for [facility] Thuirsday 11/10/12 @ 10am Nothing further needed at this time

## 2012-11-10 ENCOUNTER — Telehealth: Payer: Self-pay | Admitting: Internal Medicine

## 2012-11-10 ENCOUNTER — Encounter: Payer: Self-pay | Admitting: Internal Medicine

## 2012-11-10 ENCOUNTER — Ambulatory Visit (INDEPENDENT_AMBULATORY_CARE_PROVIDER_SITE_OTHER): Payer: BC Managed Care – PPO | Admitting: Internal Medicine

## 2012-11-10 ENCOUNTER — Telehealth: Payer: Self-pay | Admitting: Family Medicine

## 2012-11-10 VITALS — BP 110/70 | HR 98 | Ht 64.5 in | Wt 192.8 lb

## 2012-11-10 DIAGNOSIS — K219 Gastro-esophageal reflux disease without esophagitis: Secondary | ICD-10-CM

## 2012-11-10 DIAGNOSIS — J302 Other seasonal allergic rhinitis: Secondary | ICD-10-CM

## 2012-11-10 DIAGNOSIS — J45909 Unspecified asthma, uncomplicated: Secondary | ICD-10-CM

## 2012-11-10 DIAGNOSIS — F329 Major depressive disorder, single episode, unspecified: Secondary | ICD-10-CM

## 2012-11-10 DIAGNOSIS — J309 Allergic rhinitis, unspecified: Secondary | ICD-10-CM

## 2012-11-10 MED ORDER — AZELASTINE-FLUTICASONE 137-50 MCG/ACT NA SUSP: 2.0000 | Freq: Every day | NASAL | Status: DC

## 2012-11-10 NOTE — Telephone Encounter (Signed)
OK, but any specific reason given why she wants to switch?

## 2012-11-10 NOTE — Telephone Encounter (Signed)
Pt states that she spoke with [name] yesterday about an Endocrinologist that [facility] would rec her to see. Pt cannot remember name of the Dr recommended and there is nothing documented in pt chart or OV note.   Katie, can you please contact pt with this information. Thanks.
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
## 2012-11-10 NOTE — Patient Instructions (Addendum)
Sample Dymista nasal spray     1-2 puffs each nostril once daily at bedtime  Try Allegra/ fexofenadine as an antihistamine stronger than Claritin, if needed for allergy/ postnasal drip  Try a liquid antacid like Maalox, Mylanta or Gaviscon as needed

## 2012-11-10 NOTE — Progress Notes (Signed)
Cc PCP 

## 2012-11-10 NOTE — Telephone Encounter (Signed)
Spoke with [name] and she stated that she told the pt to go downstairs to see PCP and her PCP would have to send her to the endocrinologist.  Called and spoke with pt and she is aware of recs.  Nothing further is needed.

## 2012-11-10 NOTE — Telephone Encounter (Signed)
Patient and her husband are requesting to switch from Dr. Milinda Cave to [facility], please advise

## 2012-11-10 NOTE — Telephone Encounter (Signed)
ok 

## 2012-11-11 NOTE — Telephone Encounter (Signed)
Patients say that they don't think you are listening to their health concerns

## 2012-11-11 NOTE — Telephone Encounter (Signed)
OK. Thanks.

## 2012-11-11 NOTE — Telephone Encounter (Signed)
[name] are aware and have appts with Dr. Yetta Barre in Oct.

## 2012-11-14 ENCOUNTER — Telehealth: Payer: Self-pay | Admitting: Internal Medicine

## 2012-11-14 DIAGNOSIS — F329 Major depressive disorder, single episode, unspecified: Secondary | ICD-10-CM

## 2012-11-14 DIAGNOSIS — F32A Depression, unspecified: Secondary | ICD-10-CM

## 2012-11-14 NOTE — Telephone Encounter (Signed)
Spoke with pt and notified that referral was made  She verbalized understanding and states nothing further needed

## 2012-11-14 NOTE — Telephone Encounter (Signed)
Suggest referral to Dr Archer Asa, noting patient's counselor requested Psy referral for depression.

## 2012-11-14 NOTE — Telephone Encounter (Signed)
Spoke to pt. She states that taking Zoloft is not controlling her depression enough. Antony Blackbird is a Veterinary surgeon at [employer]. Pt's PCP is Dr. Yetta Barre at [facility], but she has not seen him yet. Her first appointment is not until October 2014. She is requesting that [facility] refer to a psychiatrist so that she can get her depression under control.   Last OV 11/10/2012 Pending OV 01/10/2013  CY - please advise.

## 2012-11-21 ENCOUNTER — Encounter: Payer: BC Managed Care – PPO | Admitting: Nurse Practitioner

## 2012-11-23 ENCOUNTER — Encounter: Payer: BC Managed Care – PPO | Admitting: Physician Assistant

## 2012-11-23 NOTE — Assessment & Plan Note (Signed)
Inadequate control with Nasonex, occasional antihistamine, saline rinse. Plan-trial of Allegra, sample of Dymista

## 2012-11-23 NOTE — Assessment & Plan Note (Signed)
She is associating wheezing and chest tightness with her heartburn symptoms. Plan-follow directions from GI but can also try a liquid antacid

## 2012-11-23 NOTE — Assessment & Plan Note (Signed)
Active insignificant by symptom report. I don't have the report from recent endoscopy

## 2012-11-23 NOTE — Assessment & Plan Note (Signed)
Chronic anxiety and depression; she is seeing a Veterinary surgeon

## 2012-11-25 ENCOUNTER — Emergency Department (HOSPITAL_COMMUNITY)
Admission: EM | Admit: 2012-11-25 | Discharge: 2012-11-25 | Disposition: A | Discharge status: Home / Self Care | Payer: BC Managed Care – PPO | Attending: Emergency Medicine | Admitting: Emergency Medicine

## 2012-11-25 ENCOUNTER — Encounter (HOSPITAL_COMMUNITY): Payer: Self-pay

## 2012-11-25 DIAGNOSIS — Z7982 Long term (current) use of aspirin: Secondary | ICD-10-CM | POA: Insufficient documentation

## 2012-11-25 DIAGNOSIS — Z8669 Personal history of other diseases of the nervous system and sense organs: Secondary | ICD-10-CM | POA: Insufficient documentation

## 2012-11-25 DIAGNOSIS — IMO0002 Reserved for concepts with insufficient information to code with codable children: Secondary | ICD-10-CM | POA: Insufficient documentation

## 2012-11-25 DIAGNOSIS — Z9889 Other specified postprocedural states: Secondary | ICD-10-CM | POA: Insufficient documentation

## 2012-11-25 DIAGNOSIS — Z8679 Personal history of other diseases of the circulatory system: Secondary | ICD-10-CM | POA: Insufficient documentation

## 2012-11-25 DIAGNOSIS — F411 Generalized anxiety disorder: Secondary | ICD-10-CM | POA: Insufficient documentation

## 2012-11-25 DIAGNOSIS — Z8709 Personal history of other diseases of the respiratory system: Secondary | ICD-10-CM | POA: Insufficient documentation

## 2012-11-25 DIAGNOSIS — Z8639 Personal history of other endocrine, nutritional and metabolic disease: Secondary | ICD-10-CM | POA: Insufficient documentation

## 2012-11-25 DIAGNOSIS — Z87891 Personal history of nicotine dependence: Secondary | ICD-10-CM | POA: Insufficient documentation

## 2012-11-25 DIAGNOSIS — I10 Essential (primary) hypertension: Secondary | ICD-10-CM | POA: Insufficient documentation

## 2012-11-25 DIAGNOSIS — M79672 Pain in left foot: Secondary | ICD-10-CM

## 2012-11-25 DIAGNOSIS — Z794 Long term (current) use of insulin: Secondary | ICD-10-CM | POA: Insufficient documentation

## 2012-11-25 DIAGNOSIS — Z79899 Other long term (current) drug therapy: Secondary | ICD-10-CM | POA: Insufficient documentation

## 2012-11-25 DIAGNOSIS — Z862 Personal history of diseases of the blood and blood-forming organs and certain disorders involving the immune mechanism: Secondary | ICD-10-CM | POA: Insufficient documentation

## 2012-11-25 DIAGNOSIS — J45909 Unspecified asthma, uncomplicated: Secondary | ICD-10-CM | POA: Insufficient documentation

## 2012-11-25 DIAGNOSIS — E119 Type 2 diabetes mellitus without complications: Secondary | ICD-10-CM | POA: Insufficient documentation

## 2012-11-25 DIAGNOSIS — K219 Gastro-esophageal reflux disease without esophagitis: Secondary | ICD-10-CM | POA: Insufficient documentation

## 2012-11-25 DIAGNOSIS — M25579 Pain in unspecified ankle and joints of unspecified foot: Secondary | ICD-10-CM | POA: Insufficient documentation

## 2012-11-25 MED ORDER — HYDROCODONE-ACETAMINOPHEN 5-325 MG PO TABS: 1.0000 | ORAL_TABLET | ORAL | Status: DC | PRN

## 2012-11-25 NOTE — ED Notes (Addendum)
Pain lt foot for 2 weeks. No injury, thinks is due to a heel spur.  Says her foot hurts all the time , but has been worse with wt bearing.  Good pedal pulses

## 2012-11-25 NOTE — ED Notes (Signed)
Pt states she thinks she had a heel spur to left heel. Painful today

## 2012-11-25 NOTE — ED Provider Notes (Signed)
CSN: [id number]     Arrival date & time 11/25/12  1302 History     First MD Initiated Contact with Patient 11/25/12 1333     Chief Complaint  Patient presents with  . Foot Pain    Patient is a 62 y.o. female presenting with lower extremity pain. The history is provided by the patient.  Foot Pain This is a recurrent problem. The current episode started more than 1 week ago. The problem occurs constantly. The problem has been gradually worsening. Pertinent negatives include no chest pain. The symptoms are aggravated by walking. The symptoms are relieved by rest.    Past Medical History  Diagnosis Date  . Hyperlipidemia   . Hypertension   . Diabetes mellitus   . Asthma   . 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 Hillis Range, residual palpitations  . Vocal cord dysfunction   . Esophageal reflux   . Tremor, essential     Neurontin 600 mg qhs x 1 yr--no help.  Inderal prior to this was helpful for 10 yrs then stopped working.  . Anxiety   . Allergic rhinitis   . Low sodium syndrome    Past Surgical History  Procedure Laterality Date  . Tonsillectomy    . Right breast cyst      benign  . Left ankle ligament repair    . Left elbow repair    . Partial hysterectomy    . Cholecystectomy    . Cesarean section    . Ablation for avnrt    . Colonoscopy  01/16/2004    JYN:WGNFAO rectum/colon  . Esophagogastroduodenoscopy (egd) with esophageal dilation  04/03/2002    ZHY:QMVHQION'G ring, otherwise normal esophagus, status post dilation with 56 F/Normal stomach  . Bartholin gland cyst excision    . Esophagogastroduodenoscopy (egd) with esophageal dilation N/A 11/08/2012    Procedure: ESOPHAGOGASTRODUODENOSCOPY (EGD) WITH ESOPHAGEAL DILATION;  Surgeon: Corbin Ade, MD;  Location: [facility];  Service: Endoscopy;  Laterality: N/A;  11:30  . Abdominal hysterectomy     Family History  Problem Relation Age of Onset  . Lung cancer Father     DIED  AGE 24 LUNG CA  . COPD Mother     DIED AGE 40,MRSA,COPD,PNEUMONIA  . Pneumonia Mother     DIED AGE 40,MRSA,COPD,PNEUMONIA  . Supraventricular tachycardia Mother   . COPD Brother     AGE 71  . Heart disease Sister     DIED AGE 45, SMOKER,?HEART  . Colon cancer Neg Hx    History  Substance Use Topics  . Smoking status: Former Smoker -- 0.30 packs/day for 10 years    Types: Cigarettes    Quit date: 04/06/1985  . Smokeless tobacco: Never Used  . Alcohol Use: No   OB History   Grav Para Term Preterm Abortions TAB SAB Ect Mult Living                 Review of Systems  Constitutional: Negative for fever.  Cardiovascular: Negative for chest pain.  Musculoskeletal: Positive for arthralgias.    Allergies  Guaifenesin; Oxycodone-acetaminophen; and Guaifenesin er  Home Medications   Current Outpatient Rx  Name  Route  Sig  Dispense  Refill  . albuterol (PROAIR HFA) 108 (90 BASE) MCG/ACT inhaler   Inhalation   Inhale 2 puffs into the lungs every 6 (six) hours as needed for wheezing.         Marland Kitchen albuterol (PROVENTIL) (  2.5 MG/3ML) 0.083% nebulizer solution   Nebulization   Take 2.5 mg by nebulization every 6 (six) hours as needed for wheezing or shortness of breath.         Marland Kitchen aspirin EC 81 MG tablet   Oral   Take 81 mg by mouth daily.         . Azelastine-Fluticasone (DYMISTA) 137-50 MCG/ACT SUSP   Each Nare   Place 2 sprays into both nostrils at bedtime.   1 Bottle   0   . cyclobenzaprine (FLEXERIL) 10 MG tablet   Oral   Take 5 mg by mouth 3 (three) times daily as needed for muscle spasms.          . DiphenhydrAMINE HCl (BENADRYL PO)   Oral   Take 1 tablet by mouth at bedtime as needed (allergies).         Marland Kitchen HYDROcodone-acetaminophen (NORCO/VICODIN) 5-325 MG per tablet   Oral   Take 1 tablet by mouth every 4 (four) hours as needed for pain.   5 tablet   0   . insulin detemir (LEVEMIR) 100 UNIT/ML injection   Subcutaneous   Inject 50 Units into the  skin at bedtime.          . Insulin Lispro Prot & Lispro (HUMALOG MIX 75/25 KWIKPEN Falkner)   Subcutaneous   Inject 22 Units into the skin 3 (three) times daily before meals.         . Insulin Lispro Prot & Lispro (HUMALOG MIX 75/25 KWIKPEN) (75-25) 100 UNIT/ML SUPN   Subcutaneous   Inject 22 Units into the skin 3 (three) times daily.   1 pen   0   . levothyroxine (SYNTHROID, LEVOTHROID) 75 MCG tablet   Oral   Take 75 mcg by mouth daily before breakfast.         . Linaclotide (LINZESS) 145 MCG CAPS   Oral   Take 1 capsule (145 mcg total) by mouth daily.   30 capsule   3   . LORazepam (ATIVAN) 0.5 MG tablet   Oral   Take 1 tablet (0.5 mg total) by mouth every 8 (eight) hours. If needed for nerves   30 tablet   2   . meclizine (ANTIVERT) 25 MG tablet   Oral   Take 1 tablet (25 mg total) by mouth every 6 (six) hours as needed for dizziness.   20 tablet   0   . metFORMIN (GLUCOPHAGE) 1000 MG tablet   Oral   Take 1 tablet (1,000 mg total) by mouth 2 (two) times daily with a meal.   60 tablet   3   . metoprolol succinate (TOPROL-XL) 50 MG 24 hr tablet   Oral   Take 50 mg by mouth 2 (two) times daily. Take with or immediately following a meal.         . mometasone (NASONEX) 50 MCG/ACT nasal spray   Nasal   Place 2 sprays into the nose daily.         . mometasone-formoterol (DULERA) 100-5 MCG/ACT AERO   Inhalation   Inhale 1 puff into the lungs 2 (two) times daily.         Marland Kitchen olmesartan-hydrochlorothiazide (BENICAR HCT) 40-25 MG per tablet   Oral   Take 1 tablet by mouth daily.           . primidone (MYSOLINE) 50 MG tablet   Oral   Take 150 mg by mouth 2 (two) times daily.         Marland Kitchen  promethazine-codeine (PHENERGAN WITH CODEINE) 6.25-10 MG/5ML syrup   Oral   Take 5 mLs by mouth 2 (two) times daily as needed for cough.         . Pseudoeph-Doxylamine-DM-APAP (NYQUIL PO)   Oral   Take 5 mLs by mouth at bedtime as needed (sinus drainage).          . sertraline (ZOLOFT) 100 MG tablet   Oral   Take 200 mg by mouth at bedtime.          BP 125/60  Pulse 89  Temp(Src) 97.5 F (36.4 C) (Oral)  Resp 18  Ht 5' 4.5" (1.638 m)  Wt 192 lb (87.091 kg)  BMI 32.46 kg/m2  SpO2 100% Physical Exam CONSTITUTIONAL: Well developed/well nourished HEAD: Normocephalic/atraumatic ENMT: Mucous membranes moist NECK: supple no meningeal signs CV: S1/S2 noted, no murmurs/rubs/gallops noted LUNGS: Lungs are clear to auscultation bilaterally, no apparent distress ABDOMEN: soft, nontender, no rebound or guarding NEURO: Pt is awake/alert, moves all extremitiesx4 EXTREMITIES: pulses normal, full ROM Tenderness along plantar surface of left foot.  No puncture wounds or erythema noted.  Distal pulses equal/intact.  There is no tenderness/edema to dorsal surface of foot.  Full ROM of both ankles No calf tenderness noted SKIN: warm, color normal PSYCH: no abnormalities of mood noted  ED Course   Procedures 1. Left foot pain     MDM  Nursing notes including past medical history and social history reviewed and considered in documentation  Pt reports h/o heel spurs, no recent injury Will place in postop shoe, pain meds and ortho followup Do not feel further imaging/testing needed  Joya Gaskins, MD 11/25/12 1443

## 2012-11-30 ENCOUNTER — Ambulatory Visit (INDEPENDENT_AMBULATORY_CARE_PROVIDER_SITE_OTHER): Payer: BC Managed Care – PPO | Admitting: Endocrinology

## 2012-11-30 ENCOUNTER — Encounter: Payer: Self-pay | Admitting: Endocrinology

## 2012-11-30 VITALS — BP 126/72 | HR 80 | Ht 64.0 in | Wt 189.0 lb

## 2012-11-30 DIAGNOSIS — IMO0001 Reserved for inherently not codable concepts without codable children: Secondary | ICD-10-CM

## 2012-11-30 MED ORDER — INSULIN DETEMIR 100 UNIT/ML FLEXPEN: 50.0000 [IU] | PEN_INJECTOR | Freq: Every day | SUBCUTANEOUS | Status: DC

## 2012-11-30 MED ORDER — INSULIN LISPRO 100 UNIT/ML (KWIKPEN): 22.0000 [IU] | PEN_INJECTOR | Freq: Three times a day (TID) | SUBCUTANEOUS | Status: DC

## 2012-11-30 NOTE — Patient Instructions (Addendum)
good diet and exercise habits significanly improve the control of your diabetes.  please let me know if you wish to be referred to a dietician.  high blood sugar is very risky to your health.  you should see an eye doctor every year.  You are at higher than average risk for pneumonia and hepatitis-B.  You should be vaccinated against both.   controlling your blood pressure and cholesterol drastically reduces the damage diabetes does to your body.  this also applies to quitting smoking.  please discuss these with your doctor.  check your blood sugar 2 times a day.  vary the time of day when you check, between before the 3 meals, and at bedtime.  also check if you have symptoms of your blood sugar being too high or too low.  please keep a record of the readings and bring it to your next appointment here.  please call us sooner if your blood sugar goes below 70, or if you have a lot of readings over 200. Please continue the same levemir, and: Chang the 75/25 to humalog, 22 units 3 times a day (just before each meal). Please come back for a follow-up appointment in 1-2 weeks.

## 2012-11-30 NOTE — Progress Notes (Signed)
Subjective:    Patient ID: [name], female    DOB: 06-21-1950, 62 y.o.   MRN: [id number]  HPI pt states 20 years h/o dm.  she has mild if any neuropathy of the lower extremities; she is unaware of any associated chronic complications.  she has been on insulin x 2 years.  pt says his diet and exercise are "improved." She describes her cbg's as extremely variable.  She has mild hypoglycemia almost daily, in the afternoon. Past Medical History  Diagnosis Date  . Hyperlipidemia   . Hypertension   . Diabetes mellitus   . Asthma   . 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 Hillis Range, residual palpitations  . Vocal cord dysfunction   . Esophageal reflux   . Tremor, essential     Neurontin 600 mg qhs x 1 yr--no help.  Inderal prior to this was helpful for 10 yrs then stopped working.  . Anxiety   . Allergic rhinitis   . Low sodium syndrome     Past Surgical History  Procedure Laterality Date  . Tonsillectomy    . Right breast cyst      benign  . Left ankle ligament repair    . Left elbow repair    . Partial hysterectomy    . Cholecystectomy    . Cesarean section    . Ablation for avnrt    . Colonoscopy  01/16/2004    WUJ:[id number] rectum/colon  . Esophagogastroduodenoscopy (egd) with esophageal dilation  04/03/2002    NWG:[id number]'M ring, otherwise normal esophagus, status post dilation with 56 F/Normal stomach  . Bartholin gland cyst excision    . Esophagogastroduodenoscopy (egd) with esophageal dilation N/A 11/08/2012    Procedure: ESOPHAGOGASTRODUODENOSCOPY (EGD) WITH ESOPHAGEAL DILATION;  Surgeon: Corbin Ade, MD;  Location: [facility];  Service: Endoscopy;  Laterality: N/A;  11:30  . Abdominal hysterectomy      History   Social History  . Marital Status: Married    Spouse Name: N/A    Number of Children: N/A  . Years of Education: N/A   Occupational History  . Not on file.   Social History Main Topics  . Smoking  status: Former Smoker -- 0.30 packs/day for 10 years    Types: Cigarettes    Quit date: 04/06/1985  . Smokeless tobacco: Never Used  . Alcohol Use: No  . Drug Use: No  . Sexual Activity: No   Other Topics Concern  . Not on file   Social History Narrative   Married, 1 daughte, living.  Lives with husband in [place], [place].   1 child died brain tumor at age 38.   Two grandchildren.   Works at [employer], patient currently lost her job.   Retired 2011.   No tobacco.   Alcohol: none in 30 yrs.  Distant history of heavy use.   No drug use.    Current Outpatient Prescriptions on File Prior to Visit  Medication Sig Dispense Refill  . albuterol (PROAIR HFA) 108 (90 BASE) MCG/ACT inhaler Inhale 2 puffs into the lungs every 6 (six) hours as needed for wheezing.      Marland Kitchen albuterol (PROVENTIL) (2.5 MG/3ML) 0.083% nebulizer solution Take 2.5 mg by nebulization every 6 (six) hours as needed for wheezing or shortness of breath.      Marland Kitchen aspirin EC 81 MG tablet Take 81 mg by mouth daily.      . Azelastine-Fluticasone (DYMISTA) 137-50 MCG/ACT  SUSP Place 2 sprays into both nostrils at bedtime.  1 Bottle  0  . cyclobenzaprine (FLEXERIL) 10 MG tablet Take 5 mg by mouth 3 (three) times daily as needed for muscle spasms.       . DiphenhydrAMINE HCl (BENADRYL PO) Take 1 tablet by mouth at bedtime as needed (allergies).      Marland Kitchen HYDROcodone-acetaminophen (NORCO/VICODIN) 5-325 MG per tablet Take 1 tablet by mouth every 4 (four) hours as needed for pain.  5 tablet  0  . insulin detemir (LEVEMIR) 100 UNIT/ML injection Inject 50 Units into the skin at bedtime.       . Insulin Lispro Prot & Lispro (HUMALOG MIX 75/25 KWIKPEN Maskell) Inject 22 Units into the skin 3 (three) times daily before meals.      . Insulin Lispro Prot & Lispro (HUMALOG MIX 75/25 KWIKPEN) (75-25) 100 UNIT/ML SUPN Inject 22 Units into the skin 3 (three) times daily.  1 pen  0  . levothyroxine (SYNTHROID, LEVOTHROID) 75 MCG tablet Take 75 mcg by  mouth daily before breakfast.      . Linaclotide (LINZESS) 145 MCG CAPS Take 1 capsule (145 mcg total) by mouth daily.  30 capsule  3  . LORazepam (ATIVAN) 0.5 MG tablet Take 1 tablet (0.5 mg total) by mouth every 8 (eight) hours. If needed for nerves  30 tablet  2  . meclizine (ANTIVERT) 25 MG tablet Take 1 tablet (25 mg total) by mouth every 6 (six) hours as needed for dizziness.  20 tablet  0  . metFORMIN (GLUCOPHAGE) 1000 MG tablet Take 1 tablet (1,000 mg total) by mouth 2 (two) times daily with a meal.  60 tablet  3  . metoprolol succinate (TOPROL-XL) 50 MG 24 hr tablet Take 50 mg by mouth 2 (two) times daily. Take with or immediately following a meal.      . mometasone (NASONEX) 50 MCG/ACT nasal spray Place 2 sprays into the nose daily.      . mometasone-formoterol (DULERA) 100-5 MCG/ACT AERO Inhale 1 puff into the lungs 2 (two) times daily.      Marland Kitchen olmesartan-hydrochlorothiazide (BENICAR HCT) 40-25 MG per tablet Take 1 tablet by mouth daily.        . primidone (MYSOLINE) 50 MG tablet Take 150 mg by mouth 2 (two) times daily.      . promethazine-codeine (PHENERGAN WITH CODEINE) 6.25-10 MG/5ML syrup Take 5 mLs by mouth 2 (two) times daily as needed for cough.      . Pseudoeph-Doxylamine-DM-APAP (NYQUIL PO) Take 5 mLs by mouth at bedtime as needed (sinus drainage).      . sertraline (ZOLOFT) 100 MG tablet Take 200 mg by mouth at bedtime.       No current facility-administered medications on file prior to visit.    Allergies  Allergen Reactions  . Guaifenesin     MUCINEX REACTION: asthma attacks  . Oxycodone-Acetaminophen     TYLOX.  . Guaifenesin Er    Family History  Problem Relation Age of Onset  . Lung cancer Father     DIED AGE 30 LUNG CA  . COPD Mother     DIED AGE 70,MRSA,COPD,PNEUMONIA  . Pneumonia Mother     DIED AGE 70,MRSA,COPD,PNEUMONIA  . Supraventricular tachycardia Mother   . COPD Brother     AGE 41  . Heart disease Sister     DIED AGE 62, SMOKER,?HEART  . Colon  cancer Neg Hx   DM: both parents and 1 sib  BP 126/72  Pulse 80  Ht 5\' 4"  (1.626 m)  Wt 189 lb (85.73 kg)  BMI 32.43 kg/m2  SpO2 98%  Review of Systems denies blurry vision, headache, chest pain, sob, n/v, urinary frequency, cramps, excessive diaphoresis, memory loss.  She has pain at her left heel.  She has lost weight (30 lbs x 5 months).  She has depression, rhinorrhea, easy bruising, and menopausal sxs.    Objective:   Physical Exam VS: see vs page GEN: no distress HEAD: head: no deformity eyes: no periorbital swelling, no proptosis external nose and ears are normal mouth: no lesion seen NECK: supple, thyroid is not enlarged CHEST WALL: no deformity LUNGS:  Clear to auscultation CV: reg rate and rhythm, no murmur ABD: abdomen is soft, nontender.  no hepatosplenomegaly.  not distended.  no hernia MUSCULOSKELETAL: muscle bulk and strength are grossly normal.  no obvious joint swelling.  gait is normal and steady. PULSES: no carotid bruit NEURO:  cn 2-12 grossly intact.   readily moves all 4's.   SKIN:  Normal texture and temperature.  No rash or suspicious lesion is visible.   NODES:  None palpable at the neck PSYCH: alert, oriented x3.  Does not appear anxious nor depressed.  Lab Results  Component Value Date   HGBA1C 10.8* 09/01/2012      Assessment & Plan:  DM: she needs increased rx.  This insulin regimen was chosen from multiple options, as it best matches her insulin to her changing requirements throughout the day.  The benefits of glycemic control must be weighed against the risks of hypoglycemia.   Depression: this complicates the rx of DM Weight loss, possibly due to hyperglycemia

## 2012-12-01 ENCOUNTER — Telehealth: Payer: Self-pay | Admitting: Internal Medicine

## 2012-12-01 ENCOUNTER — Ambulatory Visit: Payer: BC Managed Care – PPO | Admitting: Family Medicine

## 2012-12-01 ENCOUNTER — Telehealth: Payer: Self-pay | Admitting: Endocrinology

## 2012-12-01 MED ORDER — AMOXICILLIN 500 MG PO CAPS: 500.0000 mg | ORAL_CAPSULE | Freq: Three times a day (TID) | ORAL | Status: DC

## 2012-12-01 NOTE — Telephone Encounter (Signed)
Pt aware of recs. RX called in.  

## 2012-12-01 NOTE — Telephone Encounter (Signed)
Called pt and advised her per Dr Elvera Lennox to take 15 grams of carbs every 15 min until sugars >100, then continue to check every 30-60 min unitl stable   and not trending down. Do not take more than 10 units of Humalog with dinner tonight, and this only if sugars >120 before dinner. Advised pt to call us in the AM with her readings. Pt understood.

## 2012-12-01 NOTE — Telephone Encounter (Signed)
Per CY-lets give patient PLAIN(not Augmentin) Amoxicillin 500 mg #21 take 1 po TID no refills.

## 2012-12-01 NOTE — Telephone Encounter (Signed)
Take 15 grams of carbs every 15 minutes until sugars >100, then continue to check every 30-60 min until stable and not trending down. Do not take more than 10 units of Humalog with dinner tonight, and this only if sugars >120 before dinner.

## 2012-12-01 NOTE — Telephone Encounter (Signed)
Called and spoke with pt and she stated that she is running a low grade fever.  Bronchitis going on for about 1 month.  Pt stated that she is coughing with yellow sputum.  X 2 days.  Wheezing with cough.  Pt stated that she is waiting on a couple of calls before she uses her nebulizer meds.  She stated that Dr. Everardo All changed her insulin and she wanted to see about this medication before she takes the breathing tx.  CY please advise. Thanks  Allergies  Allergen Reactions  . Guaifenesin     MUCINEX REACTION: asthma attacks  . Oxycodone-Acetaminophen     TYLOX.  . Guaifenesin Er     Last ov--11-10-2012 Next ov--01/10/2013

## 2012-12-01 NOTE — Telephone Encounter (Signed)
Pt saw Dr Everardo All yesterday and said after she took a shot her sugar dropped to 67 and needs to know what to do tonight. Please advise in Dr George Hugh absence.

## 2012-12-02 ENCOUNTER — Emergency Department (HOSPITAL_COMMUNITY)
Admission: EM | Admit: 2012-12-02 | Discharge: 2012-12-02 | Disposition: A | Discharge status: Home / Self Care | Payer: BC Managed Care – PPO | Attending: Emergency Medicine | Admitting: Emergency Medicine

## 2012-12-02 ENCOUNTER — Encounter (HOSPITAL_COMMUNITY): Payer: Self-pay | Admitting: *Deleted

## 2012-12-02 DIAGNOSIS — Z792 Long term (current) use of antibiotics: Secondary | ICD-10-CM | POA: Insufficient documentation

## 2012-12-02 DIAGNOSIS — J45909 Unspecified asthma, uncomplicated: Secondary | ICD-10-CM | POA: Insufficient documentation

## 2012-12-02 DIAGNOSIS — Z8639 Personal history of other endocrine, nutritional and metabolic disease: Secondary | ICD-10-CM | POA: Insufficient documentation

## 2012-12-02 DIAGNOSIS — Z87891 Personal history of nicotine dependence: Secondary | ICD-10-CM | POA: Insufficient documentation

## 2012-12-02 DIAGNOSIS — J40 Bronchitis, not specified as acute or chronic: Secondary | ICD-10-CM

## 2012-12-02 DIAGNOSIS — Z7982 Long term (current) use of aspirin: Secondary | ICD-10-CM | POA: Insufficient documentation

## 2012-12-02 DIAGNOSIS — Z862 Personal history of diseases of the blood and blood-forming organs and certain disorders involving the immune mechanism: Secondary | ICD-10-CM | POA: Insufficient documentation

## 2012-12-02 DIAGNOSIS — M79609 Pain in unspecified limb: Secondary | ICD-10-CM | POA: Insufficient documentation

## 2012-12-02 DIAGNOSIS — Z8709 Personal history of other diseases of the respiratory system: Secondary | ICD-10-CM | POA: Insufficient documentation

## 2012-12-02 DIAGNOSIS — Z79899 Other long term (current) drug therapy: Secondary | ICD-10-CM | POA: Insufficient documentation

## 2012-12-02 DIAGNOSIS — Z8679 Personal history of other diseases of the circulatory system: Secondary | ICD-10-CM | POA: Insufficient documentation

## 2012-12-02 DIAGNOSIS — F411 Generalized anxiety disorder: Secondary | ICD-10-CM | POA: Insufficient documentation

## 2012-12-02 DIAGNOSIS — R609 Edema, unspecified: Secondary | ICD-10-CM | POA: Insufficient documentation

## 2012-12-02 DIAGNOSIS — E119 Type 2 diabetes mellitus without complications: Secondary | ICD-10-CM | POA: Insufficient documentation

## 2012-12-02 DIAGNOSIS — K219 Gastro-esophageal reflux disease without esophagitis: Secondary | ICD-10-CM | POA: Insufficient documentation

## 2012-12-02 DIAGNOSIS — Z794 Long term (current) use of insulin: Secondary | ICD-10-CM | POA: Insufficient documentation

## 2012-12-02 DIAGNOSIS — Z8669 Personal history of other diseases of the nervous system and sense organs: Secondary | ICD-10-CM | POA: Insufficient documentation

## 2012-12-02 DIAGNOSIS — IMO0002 Reserved for concepts with insufficient information to code with codable children: Secondary | ICD-10-CM | POA: Insufficient documentation

## 2012-12-02 DIAGNOSIS — I10 Essential (primary) hypertension: Secondary | ICD-10-CM | POA: Insufficient documentation

## 2012-12-02 MED ORDER — LEVOFLOXACIN 750 MG PO TABS
750.0000 mg | ORAL_TABLET | Freq: Once | ORAL | Status: AC
  Administered 2012-12-02: 750 mg via ORAL
  Filled 2012-12-02: qty 1

## 2012-12-02 MED ORDER — HYDROCODONE-ACETAMINOPHEN 5-325 MG PO TABS: 1.0000 | ORAL_TABLET | ORAL | Status: DC | PRN

## 2012-12-02 MED ORDER — HYDROCODONE-ACETAMINOPHEN 5-325 MG PO TABS
1.0000 | ORAL_TABLET | Freq: Once | ORAL | Status: AC
  Administered 2012-12-02: 1 via ORAL
  Filled 2012-12-02: qty 1

## 2012-12-02 MED ORDER — LEVOFLOXACIN 750 MG PO TABS: 750.0000 mg | ORAL_TABLET | Freq: Every day | ORAL | Status: DC

## 2012-12-02 MED ORDER — OLMESARTAN MEDOXOMIL-HCTZ 40-25 MG PO TABS: 1.0000 | ORAL_TABLET | Freq: Every day | ORAL | Status: DC

## 2012-12-02 NOTE — ED Notes (Addendum)
Pt c/o bilateral ankle swelling and left heel pain. Pt also c/o cough (with yellow sputum) which she called her PCP and he called her a script for Amoxicillin. Pt has been out of her binicar x 3 days

## 2012-12-03 ENCOUNTER — Telehealth: Payer: Self-pay | Admitting: Internal Medicine

## 2012-12-03 MED ORDER — FUROSEMIDE 20 MG PO TABS: ORAL_TABLET | ORAL | Status: DC

## 2012-12-03 NOTE — Telephone Encounter (Signed)
On call- She was at [facility] ER last night for bronchitis. Feet swelling. They re Rx'd Benicar 40/25 that she was out of, but insurance changed and new insurance wants prior [place]. Instead, on weekend, I offered few days of lasix and she can call office for PA next week.

## 2012-12-03 NOTE — ED Provider Notes (Signed)
CSN: [id number]     Arrival date & time 12/02/12  2006 History   First MD Initiated Contact with Patient 12/02/12 2030     Chief Complaint  Patient presents with  . Joint Swelling  . Foot Pain  . Cough   (Consider location/radiation/quality/duration/timing/severity/associated sxs/prior Treatment) HPI.... productive cough for several days. Patient was seen by primary care doctor who wrote a prescription for amoxicillin. She says this does not work for her. She is also out of her blood pressure medicine called Benicar HCT.  No fever, chills, rusty sputum. Levaquin has helped her in the past. Minimal lower extremity edema. Severity is mild. No chest pain or dyspnea.  Past Medical History  Diagnosis Date  . Hyperlipidemia   . Hypertension   . Diabetes mellitus   . Asthma   . 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 Hillis Range, residual palpitations  . Vocal cord dysfunction   . Esophageal reflux   . Tremor, essential     Neurontin 600 mg qhs x 1 yr--no help.  Inderal prior to this was helpful for 10 yrs then stopped working.  . Anxiety   . Allergic rhinitis   . Low sodium syndrome    Past Surgical History  Procedure Laterality Date  . Tonsillectomy    . Right breast cyst      benign  . Left ankle ligament repair    . Left elbow repair    . Partial hysterectomy    . Cholecystectomy    . Cesarean section    . Ablation for avnrt    . Colonoscopy  01/16/2004    QMV:HQIONG rectum/colon  . Esophagogastroduodenoscopy (egd) with esophageal dilation  04/03/2002    EXB:[id number] ring, otherwise normal esophagus, status post dilation with 56 F/Normal stomach  . Bartholin gland cyst excision    . Esophagogastroduodenoscopy (egd) with esophageal dilation N/A 11/08/2012    Procedure: ESOPHAGOGASTRODUODENOSCOPY (EGD) WITH ESOPHAGEAL DILATION;  Surgeon: Corbin Ade, MD;  Location: [facility];  Service: Endoscopy;  Laterality: N/A;  11:30  . Abdominal  hysterectomy     Family History  Problem Relation Age of Onset  . Lung cancer Father     DIED AGE 72 LUNG CA  . COPD Mother     DIED AGE 79,MRSA,COPD,PNEUMONIA  . Pneumonia Mother     DIED AGE 79,MRSA,COPD,PNEUMONIA  . Supraventricular tachycardia Mother   . COPD Brother     AGE 33  . Heart disease Sister     DIED AGE 39, SMOKER,?HEART  . Colon cancer Neg Hx    History  Substance Use Topics  . Smoking status: Former Smoker -- 0.30 packs/day for 10 years    Types: Cigarettes    Quit date: 04/06/1985  . Smokeless tobacco: Never Used  . Alcohol Use: No   OB History   Grav Para Term Preterm Abortions TAB SAB Ect Mult Living                 Review of Systems  All other systems reviewed and are negative.    Allergies  Guaifenesin; Oxycodone-acetaminophen; and Guaifenesin er  Home Medications   Current Outpatient Rx  Name  Route  Sig  Dispense  Refill  . albuterol (PROAIR HFA) 108 (90 BASE) MCG/ACT inhaler   Inhalation   Inhale 2 puffs into the lungs every 6 (six) hours as needed for wheezing.         Marland Kitchen amoxicillin (AMOXIL) 500  MG capsule   Oral   Take 1 capsule (500 mg total) by mouth 3 (three) times daily.   21 capsule   0   . aspirin EC 81 MG tablet   Oral   Take 81 mg by mouth every morning.          . Azelastine-Fluticasone (DYMISTA) 137-50 MCG/ACT SUSP   Each Nare   Place 2 sprays into both nostrils at bedtime.   1 Bottle   0   . dexlansoprazole (DEXILANT) 60 MG capsule   Oral   Take 60 mg by mouth daily.         . diphenhydrAMINE (SOMINEX) 25 MG tablet   Oral   Take 25-50 mg by mouth at bedtime as needed for allergies or sleep.         Marland Kitchen HYDROcodone-acetaminophen (NORCO/VICODIN) 5-325 MG per tablet   Oral   Take 1 tablet by mouth every 4 (four) hours as needed for pain.   5 tablet   0   . Insulin Detemir (LEVEMIR FLEXTOUCH) 100 UNIT/ML SOPN   Subcutaneous   Inject 50 Units into the skin at bedtime.   10 pen   11   . insulin  lispro (HUMALOG KWIKPEN) 100 UNIT/ML SOPN   Subcutaneous   Inject 10-22 Units into the skin 3 (three) times daily. Takes 22 units twice daily and takes 10 units at supper         . levothyroxine (SYNTHROID, LEVOTHROID) 75 MCG tablet   Oral   Take 75 mcg by mouth daily before breakfast.         . LORazepam (ATIVAN) 0.5 MG tablet   Oral   Take 1 tablet (0.5 mg total) by mouth every 8 (eight) hours. If needed for nerves   30 tablet   2   . metoprolol succinate (TOPROL-XL) 50 MG 24 hr tablet   Oral   Take 50 mg by mouth 2 (two) times daily. Take with or immediately following a meal.         . mometasone (NASONEX) 50 MCG/ACT nasal spray   Nasal   Place 2 sprays into the nose every morning.          . mometasone-formoterol (DULERA) 100-5 MCG/ACT AERO   Inhalation   Inhale 1 puff into the lungs 2 (two) times daily.         Marland Kitchen olmesartan-hydrochlorothiazide (BENICAR HCT) 40-25 MG per tablet   Oral   Take 1 tablet by mouth daily.           . primidone (MYSOLINE) 50 MG tablet   Oral   Take 150 mg by mouth 2 (two) times daily.         . promethazine-codeine (PHENERGAN WITH CODEINE) 6.25-10 MG/5ML syrup   Oral   Take 5 mLs by mouth 2 (two) times daily as needed for cough.         . Pseudoeph-Doxylamine-DM-APAP (NYQUIL PO)   Oral   Take 5 mLs by mouth at bedtime as needed (sinus drainage).         . sertraline (ZOLOFT) 100 MG tablet   Oral   Take 200 mg by mouth at bedtime.         Marland Kitchen albuterol (PROVENTIL) (2.5 MG/3ML) 0.083% nebulizer solution   Nebulization   Take 2.5 mg by nebulization every 6 (six) hours as needed for wheezing or shortness of breath.         . cyclobenzaprine (FLEXERIL) 10 MG tablet   Oral  Take 5 mg by mouth 3 (three) times daily as needed for muscle spasms.          . furosemide (LASIX) 20 MG tablet      1 daily as needed- diuretic   10 tablet   0   . HYDROcodone-acetaminophen (NORCO) 5-325 MG per tablet   Oral   Take 1  tablet by mouth every 4 (four) hours as needed for pain.   15 tablet   0   . levofloxacin (LEVAQUIN) 750 MG tablet   Oral   Take 1 tablet (750 mg total) by mouth daily. X 7 days   7 tablet   0   . olmesartan-hydrochlorothiazide (BENICAR HCT) 40-25 MG per tablet   Oral   Take 1 tablet by mouth daily.   30 tablet   1    BP 144/76  Pulse 78  Temp(Src) 98.8 F (37.1 C) (Oral)  Resp 20  Ht 5\' 4"  (1.626 m)  Wt 184 lb (83.462 kg)  BMI 31.57 kg/m2  SpO2 99% Physical Exam  Nursing note and vitals reviewed. Constitutional: She is oriented to person, place, and time. She appears well-developed and well-nourished.  HENT:  Head: Normocephalic and atraumatic.  Eyes: Conjunctivae and EOM are normal. Pupils are equal, round, and reactive to light.  Neck: Normal range of motion. Neck supple.  Cardiovascular: Normal rate, regular rhythm and normal heart sounds.   Pulmonary/Chest: Effort normal and breath sounds normal.  Abdominal: Soft. Bowel sounds are normal.  Musculoskeletal: Normal range of motion.  Neurological: She is alert and oriented to person, place, and time.  Skin: Skin is warm and dry.  1+ peripheral edema  Psychiatric: She has a normal mood and affect.    ED Course  Procedures (including critical care time) Labs Review Labs Reviewed - No data to display Imaging Review No results found.  MDM   1. Bronchitis    Patient is nontoxic. Good oxygenation. Rx Levaquin for persistent bronchitis.    Donnetta Hutching, MD 12/03/12 [phone]

## 2012-12-04 ENCOUNTER — Emergency Department (HOSPITAL_COMMUNITY)
Admission: EM | Admit: 2012-12-04 | Discharge: 2012-12-04 | Disposition: A | Discharge status: Home / Self Care | Payer: BC Managed Care – PPO | Attending: Emergency Medicine | Admitting: Emergency Medicine

## 2012-12-04 ENCOUNTER — Emergency Department (HOSPITAL_COMMUNITY): Payer: BC Managed Care – PPO

## 2012-12-04 ENCOUNTER — Encounter (HOSPITAL_COMMUNITY): Payer: Self-pay | Admitting: Emergency Medicine

## 2012-12-04 DIAGNOSIS — Z87891 Personal history of nicotine dependence: Secondary | ICD-10-CM | POA: Insufficient documentation

## 2012-12-04 DIAGNOSIS — J309 Allergic rhinitis, unspecified: Secondary | ICD-10-CM | POA: Insufficient documentation

## 2012-12-04 DIAGNOSIS — Z885 Allergy status to narcotic agent status: Secondary | ICD-10-CM | POA: Insufficient documentation

## 2012-12-04 DIAGNOSIS — J3489 Other specified disorders of nose and nasal sinuses: Secondary | ICD-10-CM | POA: Insufficient documentation

## 2012-12-04 DIAGNOSIS — F411 Generalized anxiety disorder: Secondary | ICD-10-CM | POA: Insufficient documentation

## 2012-12-04 DIAGNOSIS — R079 Chest pain, unspecified: Secondary | ICD-10-CM | POA: Insufficient documentation

## 2012-12-04 DIAGNOSIS — K219 Gastro-esophageal reflux disease without esophagitis: Secondary | ICD-10-CM | POA: Insufficient documentation

## 2012-12-04 DIAGNOSIS — Z794 Long term (current) use of insulin: Secondary | ICD-10-CM | POA: Insufficient documentation

## 2012-12-04 DIAGNOSIS — E119 Type 2 diabetes mellitus without complications: Secondary | ICD-10-CM | POA: Insufficient documentation

## 2012-12-04 DIAGNOSIS — J441 Chronic obstructive pulmonary disease with (acute) exacerbation: Secondary | ICD-10-CM | POA: Insufficient documentation

## 2012-12-04 DIAGNOSIS — J45901 Unspecified asthma with (acute) exacerbation: Secondary | ICD-10-CM | POA: Insufficient documentation

## 2012-12-04 DIAGNOSIS — Z7982 Long term (current) use of aspirin: Secondary | ICD-10-CM | POA: Insufficient documentation

## 2012-12-04 DIAGNOSIS — I10 Essential (primary) hypertension: Secondary | ICD-10-CM | POA: Insufficient documentation

## 2012-12-04 DIAGNOSIS — Z79899 Other long term (current) drug therapy: Secondary | ICD-10-CM | POA: Insufficient documentation

## 2012-12-04 DIAGNOSIS — E785 Hyperlipidemia, unspecified: Secondary | ICD-10-CM | POA: Insufficient documentation

## 2012-12-04 DIAGNOSIS — M7989 Other specified soft tissue disorders: Secondary | ICD-10-CM | POA: Insufficient documentation

## 2012-12-04 DIAGNOSIS — G250 Essential tremor: Secondary | ICD-10-CM | POA: Insufficient documentation

## 2012-12-04 DIAGNOSIS — Z888 Allergy status to other drugs, medicaments and biological substances status: Secondary | ICD-10-CM | POA: Insufficient documentation

## 2012-12-04 LAB — BASIC METABOLIC PANEL
BUN: 6 mg/dL (ref 6–23)
CO2: 24 mEq/L (ref 19–32)
Calcium: 9.5 mg/dL (ref 8.4–10.5)
Chloride: 99 mEq/L (ref 96–112)
Creatinine, Ser: 0.64 mg/dL (ref 0.50–1.10)
GFR calc Af Amer: >90 mL/min (ref >90)
GFR calc non Af Amer: >90 mL/min (ref >90)
Glucose, Bld: 257 mg/dL — ABNORMAL HIGH (ref 70–99)
Potassium: 3.9 mEq/L (ref 3.5–5.1)
Sodium: 136 mEq/L (ref 135–145)

## 2012-12-04 LAB — CBC
HCT: 35.2 % — ABNORMAL LOW (ref 36.0–46.0)
Hemoglobin: 12.3 g/dL (ref 12.0–15.0)
MCH: 30.4 pg (ref 26.0–34.0)
MCHC: 34.9 g/dL (ref 30.0–36.0)
MCV: 86.9 fL (ref 78.0–100.0)
Platelets: 274 10*3/uL (ref 150–400)
RBC: 4.05 MIL/uL (ref 3.87–5.11)
RDW: 12.9 % (ref 11.5–15.5)
WBC: 8 10*3/uL (ref 4.0–10.5)

## 2012-12-04 LAB — POCT I-STAT TROPONIN I: Troponin i, poc: 0 ng/mL (ref 0.00–0.08)

## 2012-12-04 LAB — GLUCOSE, CAPILLARY: Glucose-Capillary: 271 mg/dL — ABNORMAL HIGH (ref 70–99)

## 2012-12-04 LAB — PRO B NATRIURETIC PEPTIDE: Pro B Natriuretic peptide (BNP): 411.3 pg/mL — ABNORMAL HIGH (ref 0–125)

## 2012-12-04 MED ORDER — ALBUTEROL SULFATE (5 MG/ML) 0.5% IN NEBU
2.5000 mg | INHALATION_SOLUTION | RESPIRATORY_TRACT | Status: DC
  Administered 2012-12-04: 2.5 mg via RESPIRATORY_TRACT
  Filled 2012-12-04: qty 0.5

## 2012-12-04 MED ORDER — ALBUTEROL SULFATE (5 MG/ML) 0.5% IN NEBU
5.0000 mg | INHALATION_SOLUTION | Freq: Once | RESPIRATORY_TRACT | Status: AC
  Administered 2012-12-04: 5 mg via RESPIRATORY_TRACT
  Filled 2012-12-04: qty 1

## 2012-12-04 MED ORDER — PREDNISONE 20 MG PO TABS
60.0000 mg | ORAL_TABLET | Freq: Once | ORAL | Status: AC
  Administered 2012-12-04: 60 mg via ORAL
  Filled 2012-12-04: qty 3

## 2012-12-04 MED ORDER — PREDNISONE 20 MG PO TABS: 40.0000 mg | ORAL_TABLET | Freq: Every day | ORAL | Status: DC

## 2012-12-04 MED ORDER — IPRATROPIUM BROMIDE 0.02 % IN SOLN
0.5000 mg | RESPIRATORY_TRACT | Status: DC
  Administered 2012-12-04: 0.5 mg via RESPIRATORY_TRACT
  Filled 2012-12-04: qty 2.5

## 2012-12-04 NOTE — ED Provider Notes (Signed)
The patient has a history of COPD as well as diabetes hypertension, asthma. She has been having increased coughing with yellow sputum, recently started on Levaquin but has had persistent and increasing wheezing. On exam she has diffuse mild expiratory wheezing, speaks in full sentences, I have personally seen and evaluated and interpreted her chest x-ray which shows no signs of acute infiltrates, pneumothorax or mediastinal abnormalities. She was given prednisone, instructed to continue antibiotics, nebulizer treatments which improved her shortness of breath considerably and is stable for discharge.  Medical screening examination/treatment/procedure(s) were conducted as a shared visit with non-physician practitioner(s) and myself.  I personally evaluated the patient during the encounter.  Clinical Impression: COPD exacerbation   Vida Roller, MD 12/04/12 2358

## 2012-12-04 NOTE — ED Notes (Signed)
CBG 271  

## 2012-12-04 NOTE — ED Provider Notes (Signed)
CSN: [id number]     Arrival date & time 12/04/12  1635 History   First MD Initiated Contact with Patient 12/04/12 1716     Chief Complaint  Patient presents with  . Asthma  . Wheezing  . Chest Pain    HPI  [name] is a 62 year old female with a PMH of asthma, COPD, DM, HTN, HLD, GERD, essential tremor, anxiety, and allergic rhinitis who presents to the ED for evaluation of chest pain, wheezing, and asthma.  Patient states that she had her esophagus dilated about a month and a half ago has been having a productive cough ever since.  She states that she's been coughing up a "cup and a half" of yellow sputum with no blood. She also complains of some sharp midsternal chest pain without radiation and with coughing only.  She denies any chest pain at rest or currently.  She states she has SOB with coughing with no SOB at rest.  She states that she did two albuterol treatments at home with minimal relief.  She states that she called her pulmonologist Dr. Jetty Duhamel and he suggested she go to the emergency room if she is this symptomatic.  She states that she was prescribed antibiotics amoxicillin on 12/01/12, but was later changed to Levaquin on 12/02/12.  She states she's been taking her antibiotics but did miss one dose this morning.  She also states that 3 days ago she ran out of her Benicar and was recently started on furosemide 20 mg which she started yesterday. She states that her leg edema currently is "worse than usual," however she did not take her furosemide today because she did not want to consistently urinate during church. No calf cramping or tenderness bilaterally. Patient also complains of some sinus congestion and rhinorrhea. Denies any fever, chills, change in appetite or activity, abdominal pain, nausea, vomiting, diarrhea, constipation, dysuria, headache, dizziness, or lightheadedness.  She states that she has not had any COPD or asthma exacerbation since last spring.  She states  she has not been on steroids since that time. She states that she's had to be hospitalized in the past for her COPD and asthma, however today she is not as bad as last time.      Past Medical History  Diagnosis Date  . Hyperlipidemia   . Hypertension   . Diabetes mellitus   . Asthma   . 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 Hillis Range, residual palpitations  . Vocal cord dysfunction   . Esophageal reflux   . Tremor, essential     Neurontin 600 mg qhs x 1 yr--no help.  Inderal prior to this was helpful for 10 yrs then stopped working.  . Anxiety   . Allergic rhinitis   . Low sodium syndrome    Past Surgical History  Procedure Laterality Date  . Tonsillectomy    . Right breast cyst      benign  . Left ankle ligament repair    . Left elbow repair    . Partial hysterectomy    . Cholecystectomy    . Cesarean section    . Ablation for avnrt    . Colonoscopy  01/16/2004    WUJ:WJXBJY rectum/colon  . Esophagogastroduodenoscopy (egd) with esophageal dilation  04/03/2002    NWG:NFAOZHYQ'M ring, otherwise normal esophagus, status post dilation with 56 F/Normal stomach  . Bartholin gland cyst excision    . Esophagogastroduodenoscopy (egd)  with esophageal dilation N/A 11/08/2012    Procedure: ESOPHAGOGASTRODUODENOSCOPY (EGD) WITH ESOPHAGEAL DILATION;  Surgeon: Corbin Ade, MD;  Location: [facility];  Service: Endoscopy;  Laterality: N/A;  11:30  . Abdominal hysterectomy     Family History  Problem Relation Age of Onset  . Lung cancer Father     DIED AGE 34 LUNG CA  . COPD Mother     DIED AGE 75,MRSA,COPD,PNEUMONIA  . Pneumonia Mother     DIED AGE 75,MRSA,COPD,PNEUMONIA  . Supraventricular tachycardia Mother   . COPD Brother     AGE 91  . Heart disease Sister     DIED AGE 73, SMOKER,?HEART  . Colon cancer Neg Hx    History  Substance Use Topics  . Smoking status: Former Smoker -- 0.30 packs/day for 10 years    Types: Cigarettes     Quit date: 04/06/1985  . Smokeless tobacco: Never Used  . Alcohol Use: No   OB History   Grav Para Term Preterm Abortions TAB SAB Ect Mult Living                 Review of Systems  Constitutional: Negative for fever, chills, activity change, appetite change and fatigue.  HENT: Positive for congestion, rhinorrhea and sinus pressure. Negative for ear pain, sore throat, neck pain and neck stiffness.   Eyes: Negative for visual disturbance.  Respiratory: Positive for cough and shortness of breath.   Cardiovascular: Positive for chest pain and leg swelling.  Gastrointestinal: Negative for nausea, vomiting, abdominal pain, diarrhea and constipation.  Genitourinary: Negative for dysuria.  Musculoskeletal: Negative for back pain.  Skin: Negative for wound.  Neurological: Negative for dizziness, syncope, weakness, light-headedness, numbness and headaches.    Allergies  Guaifenesin; Oxycodone-acetaminophen; and Guaifenesin er  Home Medications   Current Outpatient Rx  Name  Route  Sig  Dispense  Refill  . albuterol (PROAIR HFA) 108 (90 BASE) MCG/ACT inhaler   Inhalation   Inhale 2 puffs into the lungs every 6 (six) hours as needed for wheezing.         Marland Kitchen albuterol (PROVENTIL) (2.5 MG/3ML) 0.083% nebulizer solution   Nebulization   Take 2.5 mg by nebulization every 6 (six) hours as needed for wheezing or shortness of breath.         Marland Kitchen amoxicillin (AMOXIL) 500 MG capsule   Oral   Take 1 capsule (500 mg total) by mouth 3 (three) times daily.   21 capsule   0   . aspirin EC 81 MG tablet   Oral   Take 81 mg by mouth every morning.          . Azelastine-Fluticasone (DYMISTA) 137-50 MCG/ACT SUSP   Each Nare   Place 2 sprays into both nostrils at bedtime.   1 Bottle   0   . cyclobenzaprine (FLEXERIL) 10 MG tablet   Oral   Take 5 mg by mouth 3 (three) times daily as needed for muscle spasms.          Marland Kitchen dexlansoprazole (DEXILANT) 60 MG capsule   Oral   Take 60 mg by  mouth daily.         . diphenhydrAMINE (SOMINEX) 25 MG tablet   Oral   Take 25-50 mg by mouth at bedtime as needed for allergies or sleep.         . furosemide (LASIX) 20 MG tablet      1 daily as needed- diuretic   10 tablet   0   .  HYDROcodone-acetaminophen (NORCO) 5-325 MG per tablet   Oral   Take 1 tablet by mouth every 4 (four) hours as needed for pain.   15 tablet   0   . HYDROcodone-acetaminophen (NORCO/VICODIN) 5-325 MG per tablet   Oral   Take 1 tablet by mouth every 4 (four) hours as needed for pain.   5 tablet   0   . Insulin Detemir (LEVEMIR FLEXTOUCH) 100 UNIT/ML SOPN   Subcutaneous   Inject 50 Units into the skin at bedtime.   10 pen   11   . insulin lispro (HUMALOG KWIKPEN) 100 UNIT/ML SOPN   Subcutaneous   Inject 10-22 Units into the skin 3 (three) times daily. Takes 22 units twice daily and takes 10 units at supper         . levofloxacin (LEVAQUIN) 750 MG tablet   Oral   Take 1 tablet (750 mg total) by mouth daily. X 7 days   7 tablet   0   . levothyroxine (SYNTHROID, LEVOTHROID) 75 MCG tablet   Oral   Take 75 mcg by mouth daily before breakfast.         . LORazepam (ATIVAN) 0.5 MG tablet   Oral   Take 1 tablet (0.5 mg total) by mouth every 8 (eight) hours. If needed for nerves   30 tablet   2   . metoprolol succinate (TOPROL-XL) 50 MG 24 hr tablet   Oral   Take 50 mg by mouth 2 (two) times daily. Take with or immediately following a meal.         . mometasone (NASONEX) 50 MCG/ACT nasal spray   Nasal   Place 2 sprays into the nose every morning.          . mometasone-formoterol (DULERA) 100-5 MCG/ACT AERO   Inhalation   Inhale 1 puff into the lungs 2 (two) times daily.         Marland Kitchen olmesartan-hydrochlorothiazide (BENICAR HCT) 40-25 MG per tablet   Oral   Take 1 tablet by mouth daily.           Marland Kitchen olmesartan-hydrochlorothiazide (BENICAR HCT) 40-25 MG per tablet   Oral   Take 1 tablet by mouth daily.   30 tablet   1    . primidone (MYSOLINE) 50 MG tablet   Oral   Take 150 mg by mouth 2 (two) times daily.         . promethazine-codeine (PHENERGAN WITH CODEINE) 6.25-10 MG/5ML syrup   Oral   Take 5 mLs by mouth 2 (two) times daily as needed for cough.         . Pseudoeph-Doxylamine-DM-APAP (NYQUIL PO)   Oral   Take 5 mLs by mouth at bedtime as needed (sinus drainage).         . sertraline (ZOLOFT) 100 MG tablet   Oral   Take 200 mg by mouth at bedtime.          BP 154/78  Pulse 102  Temp(Src) 97.9 F (36.6 C) (Oral)  Resp 18  Ht 5' 4.5" (1.638 m)  Wt 170 lb (77.111 kg)  BMI 28.74 kg/m2  SpO2 94%  Filed Vitals:   12/04/12 1900 12/04/12 1915 12/04/12 1945 12/04/12 1946  BP: 142/81 132/72 135/70 135/70  Pulse: 99 95 96 101  Temp:      TempSrc:      Resp:    22  Height:      Weight:      SpO2: 100% 100% 100%  100%    Physical Exam  Nursing note and vitals reviewed. Constitutional: She is oriented to person, place, and time. She appears well-developed and well-nourished. No distress.  Resting tremor throughout.  HENT:  Head: Normocephalic and atraumatic.  Right Ear: External ear normal.  Left Ear: External ear normal.  Nose: Nose normal.  Mouth/Throat: Oropharynx is clear and moist. No oropharyngeal exudate.  Eyes: Conjunctivae are normal. Pupils are equal, round, and reactive to light. Right eye exhibits no discharge. Left eye exhibits no discharge.  Neck: Normal range of motion. Neck supple.  Cardiovascular: Normal rate, regular rhythm, normal heart sounds and intact distal pulses.  Exam reveals no gallop and no friction rub.   No murmur heard. Pulmonary/Chest: Effort normal. No respiratory distress. She has wheezes. She has no rales. She exhibits no tenderness.  Expiratory wheezing throughout   Abdominal: Soft. She exhibits no distension. There is no tenderness.  Musculoskeletal: Normal range of motion. She exhibits edema. She exhibits no tenderness.  1+ pitting  bilateral leg edema bilaterally  Neurological: She is alert and oriented to person, place, and time.  Skin: Skin is warm and dry. She is not diaphoretic.    ED Course  Procedures (including critical care time) Labs Review Labs Reviewed  CBC  BASIC METABOLIC PANEL  PRO B NATRIURETIC PEPTIDE  POCT I-STAT TROPONIN I   Imaging Review No results found.  Results for orders placed during the hospital encounter of 12/04/12  CBC      Result Value Range   WBC 8.0  4.0 - 10.5 K/uL   RBC 4.05  3.87 - 5.11 MIL/uL   Hemoglobin 12.3  12.0 - 15.0 g/dL   HCT 16.1 (*) 09.6 - 04.5 %   MCV 86.9  78.0 - 100.0 fL   MCH 30.4  26.0 - 34.0 pg   MCHC 34.9  30.0 - 36.0 g/dL   RDW 40.9  81.1 - 91.4 %   Platelets 274  150 - 400 K/uL  BASIC METABOLIC PANEL      Result Value Range   Sodium 136  135 - 145 mEq/L   Potassium 3.9  3.5 - 5.1 mEq/L   Chloride 99  96 - 112 mEq/L   CO2 24  19 - 32 mEq/L   Glucose, Bld 257 (*) 70 - 99 mg/dL   BUN 6  6 - 23 mg/dL   Creatinine, Ser 7.82  0.50 - 1.10 mg/dL   Calcium 9.5  8.4 - 95.6 mg/dL   GFR calc non Af Amer >90  >90 mL/min   GFR calc Af Amer >90  >90 mL/min  PRO B NATRIURETIC PEPTIDE      Result Value Range   Pro B Natriuretic peptide (BNP) 411.3 (*) 0 - 125 pg/mL  GLUCOSE, CAPILLARY      Result Value Range   Glucose-Capillary 271 (*) 70 - 99 mg/dL   Comment 1 Documented in Chart     Comment 2 Notify RN    POCT I-STAT TROPONIN I      Result Value Range   Troponin i, poc 0.00  0.00 - 0.08 ng/mL   Comment 3                  DG Chest 2 View (if patient has fever and/or COPD) (Final result)  Result time: 12/04/12 17:35:22    Final result by Rad Results In Interface (12/04/12 17:35:22)    Narrative:   *RADIOLOGY REPORT*  Clinical Data: Shortness of breath, wheezing, cough  and history of asthma.  CHEST - 2 VIEW  Comparison: 08/24/2012  Findings: The heart size and mediastinal contours are within normal limits. Both lungs are clear. The  visualized skeletal structures are unremarkable.  IMPRESSION: No active disease.   Original Report Authenticated By: Irish Lack, M.D.         Date: 12/04/2012  Rate: 100  Rhythm: normal sinus rhythm  QRS Axis: normal  Intervals: normal  ST/T Wave abnormalities: normal  Conduction Disutrbances:none  Narrative Interpretation:   Old EKG Reviewed: 08/20/2012 - unchanged    MDM   1. Asthma exacerbation     [name] is a 62 year old female with a PMH of asthma, COPD, DM, HTN, HLD, GERD, essential tremor, anxiety, and allergic rhinitis who presents to the ED for evaluation of chest pain, wheezing, and asthma.  Troponin, chest x-ray, CBC, BMP, and BNP ordered to further evaluate.  Albuterol given for wheezing per RN protocol.  Duoneb ordered.  60 mg prednisone ordered.     Rechecks  7:41 PM = Lungs clear to auscultation after DuoNeb and prednisone.  Patient states she feels much better.  She states she is ready for discharge.     Etiology of wheezing possibly due to and asthma/COPD exacerbation vs. bronchitis. Patient was given an albuterol and DuoNeb treatment in the emergency room and had significant improvement in her symptoms.  She also received prednisone in the emergency department and was prescribed a prednisone 40 mg pack x 5 days.  Patient is currently on Levaquin and was instructed to continue to take her antibiotics. She was also instructed to continued to albuterol treatments every 4 hours. She states that she has enough albuterol at home. She was also instructed to take her Lasix and insulin when she gets home.  She was instructed to continue to take her medications as scheduled. Patient's chest pain is likely musculoskeletal in nature secondary to coughing.  Patient's troponin was negative and her EKG showed no acute ischemic changes. Her chest x-ray was negative for an acute cardiopulmonary process.  Patient's BNP was elevated in the low 400's, however, her  symptoms appeared to be more pulmonary in nature rather than cardiac.  Patient was instructed to followup with her primary care provider in the next one to two days if her symptoms are not improving.  Patient is afebrile emergency department and remained in no acute distress. She was instructed to return to the emergency department if she has any change or worsening chest pain, shortness of breath not relieved by inhalers, fever, hemoptysis, weakness, unilateral swelling or other concerns.  She will be driven home by her husband was in the emergency department with her. She was in agreement with discharge and plan   Final impressions: 1. Asthma exacerbation     Greer Ee Jerran Tappan PA-C    Jillyn Ledger, [place] 12/04/12 2032

## 2012-12-04 NOTE — ED Notes (Signed)
Pt with audible inspiratory wheezing onset this afternoon. Pt currently taking furosemide for fluid in her ankles.

## 2012-12-06 ENCOUNTER — Telehealth: Payer: Self-pay | Admitting: Internal Medicine

## 2012-12-06 ENCOUNTER — Telehealth: Payer: Self-pay | Admitting: Endocrinology

## 2012-12-06 MED ORDER — LEVOTHYROXINE SODIUM 75 MCG PO TABS: 75.0000 ug | ORAL_TABLET | Freq: Every day | ORAL | Status: DC

## 2012-12-06 NOTE — Telephone Encounter (Signed)
atc na x1 

## 2012-12-06 NOTE — Telephone Encounter (Signed)
Pt called back. Advised her that her PCP would need to be the one to refill her BP meds. States that she talked to CY over the weekend and he said that he would refill this for her.  CY  - please advise. Thanks.

## 2012-12-06 NOTE — Telephone Encounter (Signed)
Per CY-"I gave her a short course of lasix, but I can't take on general care or BP management". Thanks.

## 2012-12-06 NOTE — Telephone Encounter (Signed)
i spoke with pt and she stated she has someone who can do this. She needed nothing further

## 2012-12-12 ENCOUNTER — Encounter (HOSPITAL_COMMUNITY): Payer: Self-pay | Admitting: Psychiatry

## 2012-12-12 ENCOUNTER — Telehealth: Payer: Self-pay | Admitting: Internal Medicine

## 2012-12-12 ENCOUNTER — Ambulatory Visit (INDEPENDENT_AMBULATORY_CARE_PROVIDER_SITE_OTHER): Payer: BC Managed Care – PPO | Admitting: Psychiatry

## 2012-12-12 VITALS — BP 140/81 | Ht 64.0 in | Wt 185.0 lb

## 2012-12-12 DIAGNOSIS — F411 Generalized anxiety disorder: Secondary | ICD-10-CM

## 2012-12-12 MED ORDER — PROMETHAZINE-CODEINE 6.25-10 MG/5ML PO SYRP: 5.0000 mL | ORAL_SOLUTION | Freq: Two times a day (BID) | ORAL | Status: DC | PRN

## 2012-12-12 MED ORDER — LORAZEPAM 1 MG PO TABS: 1.0000 mg | ORAL_TABLET | Freq: Three times a day (TID) | ORAL | Status: DC

## 2012-12-12 MED ORDER — DULOXETINE HCL 60 MG PO CPEP: 60.0000 mg | ORAL_CAPSULE | Freq: Every day | ORAL | Status: DC

## 2012-12-12 NOTE — Telephone Encounter (Signed)
I spoke with pt. She reports she went to ED 12/04/12. She was giving abx and prednisone. She c/o a lot of hard coughing and bringing up thick, foamy yellow tint phlem. She had to call 911 this AM d/t having CP and chest heaviness. They were told she was having chest wall pain d/t all of her hard coughing. Pt states she has ran out of her phenergan w/ codeine cough syrup. She is wanting to know if Dr. Maple Hudson wants to see her and/or if he wants to call in cough syrup for her. Please advise thanks Last OV 11/10/12 Pending 01/10/13 Allergies  Allergen Reactions  . Guaifenesin     MUCINEX REACTION: asthma attacks  . Oxycodone-Acetaminophen     TYLOX.  . Guaifenesin Er

## 2012-12-12 NOTE — Telephone Encounter (Signed)
I spoke with pt. She stated she needed to see Dr. Maple Hudson sooner than next available. He had an opening on Thursday at 1:45. She stated that would be fine and needed nothing further. appt made.

## 2012-12-12 NOTE — Telephone Encounter (Signed)
Spoke with pt and notified of recs per [facility] Rx was faxed to pharm
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
## 2012-12-12 NOTE — Telephone Encounter (Signed)
Ok to refill her phen/cod cough syrup, hoping to save her a visit.

## 2012-12-14 ENCOUNTER — Ambulatory Visit (INDEPENDENT_AMBULATORY_CARE_PROVIDER_SITE_OTHER): Payer: BC Managed Care – PPO | Admitting: Orthopedic Surgery

## 2012-12-14 ENCOUNTER — Encounter: Payer: Self-pay | Admitting: Orthopedic Surgery

## 2012-12-14 VITALS — BP 133/83 | Ht 64.5 in | Wt 179.0 lb

## 2012-12-14 DIAGNOSIS — M722 Plantar fascial fibromatosis: Secondary | ICD-10-CM | POA: Insufficient documentation

## 2012-12-14 NOTE — Addendum Note (Signed)
Addended by: Fuller Canada E on: 12/14/2012 10:39 AM   Modules accepted: Level of Service

## 2012-12-14 NOTE — Patient Instructions (Addendum)
You have received a steroid shot. 15% of patients experience increased pain at the injection site with in the next 24 hours. This is best treated with ice and tylenol extra strength 2 tabs every 8 hours. If you are still having pain please call the office.    Wear heel cups   Plantar Fasciitis Plantar fasciitis is a common condition that causes foot pain. It is soreness (inflammation) of the band of tough fibrous tissue on the bottom of the foot that runs from the heel bone (calcaneus) to the ball of the foot. The cause of this soreness may be from excessive standing, poor fitting shoes, running on hard surfaces, being overweight, having an abnormal walk, or overuse (this is common in runners) of the painful foot or feet. It is also common in aerobic exercise dancers and ballet dancers. SYMPTOMS  Most people with plantar fasciitis complain of:  Severe pain in the morning on the bottom of their foot especially when taking the first steps out of bed. This pain recedes after a few minutes of walking.  Severe pain is experienced also during walking following a long period of inactivity.  Pain is worse when walking barefoot or up stairs DIAGNOSIS   Your caregiver will diagnose this condition by examining and feeling your foot.  Special tests such as X-rays of your foot, are usually not needed. PREVENTION   Consult a sports medicine professional before beginning a new exercise program.  Walking programs offer a good workout. With walking there is a lower chance of overuse injuries common to runners. There is less impact and less jarring of the joints.  Begin all new exercise programs slowly. If problems or pain develop, decrease the amount of time or distance until you are at a comfortable level.  Wear good shoes and replace them regularly.  Stretch your foot and the heel cords at the back of the ankle (Achilles tendon) both before and after exercise.  Run or exercise on even surfaces that  are not hard. For example, asphalt is better than pavement.  Do not run barefoot on hard surfaces.  If using a treadmill, vary the incline.  Do not continue to workout if you have foot or joint problems. Seek professional help if they do not improve. HOME CARE INSTRUCTIONS   Avoid activities that cause you pain until you recover.  Use ice or cold packs on the problem or painful areas after working out.  Only take over-the-counter or prescription medicines for pain, discomfort, or fever as directed by your caregiver.  Soft shoe inserts or athletic shoes with air or gel sole cushions may be helpful.  If problems continue or become more severe, consult a sports medicine caregiver or your own health care provider. Cortisone is a potent anti-inflammatory medication that may be injected into the painful area. You can discuss this treatment with your caregiver. MAKE SURE YOU:   Understand these instructions.  Will watch your condition.  Will get help right away if you are not doing well or get worse. Document Released: 12/16/2000 Document Revised: 06/15/2011 Document Reviewed: 02/15/2008 [facility] Patient Information 2014 [place], [place].

## 2012-12-14 NOTE — Progress Notes (Signed)
Patient ID: [name], female   DOB: 15-Oct-1950, 62 y.o.   MRN: [id number]  Chief Complaint  Patient presents with  . Foot Pain    2 months heel pain left foot   HISTORY: 62 years old female history of left heel pain 2001 status post lateral ankle reconstruction which sounds like a peroneal tendon repair presents with a two-month history of recurrent left heel pain plantar aspect of the heel which is worse with walking. Pain level is 10 out of 10 timing is constant description is sharp. Previous treatment in 2001 she had an injection other than that she's been using ice and Ace bandage and she has been cold inserts  Review of systems is positive in all modalities with the highlights as follows chills T. watering chest pain wheezing heartburn frequency joint pain rash numbness nervousness anxiety depression easy bruising excessive thirst and seasonal allergies  Past Medical History  Diagnosis Date  . Hyperlipidemia   . Hypertension   . Diabetes mellitus   . Asthma   . 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 Hillis Range, residual palpitations  . Vocal cord dysfunction   . Esophageal reflux   . Tremor, essential     Neurontin 600 mg qhs x 1 yr--no help.  Inderal prior to this was helpful for 10 yrs then stopped working.  . Anxiety   . Allergic rhinitis   . Low sodium syndrome   . Depression    Past Surgical History  Procedure Laterality Date  . Tonsillectomy    . Right breast cyst      benign  . Left ankle ligament repair    . Left elbow repair    . Partial hysterectomy    . Cholecystectomy    . Cesarean section    . Ablation for avnrt    . Colonoscopy  01/16/2004    WUJ:[id number] rectum/colon  . Esophagogastroduodenoscopy (egd) with esophageal dilation  04/03/2002    NWG:[id number] ring, otherwise normal esophagus, status post dilation with 56 F/Normal stomach  . Bartholin gland cyst excision    . Esophagogastroduodenoscopy (egd) with  esophageal dilation N/A 11/08/2012    Procedure: ESOPHAGOGASTRODUODENOSCOPY (EGD) WITH ESOPHAGEAL DILATION;  Surgeon: Corbin Ade, MD;  Location: [facility];  Service: Endoscopy;  Laterality: N/A;  11:30  . Abdominal hysterectomy     BP 133/83  Ht 5' 4.5" (1.638 m)  Wt 179 lb (81.194 kg)  BMI 30.26 kg/m2   Vital signs are stable as recorded  General appearance is normal, body habitus medium frame  The patient is alert and oriented x 3  The patient's mood and affect are normal  Gait assessment: No significant abnormalities no assistive devices  The cardiovascular exam reveals normal pulses and temperature without edema or  swelling.    The sensory exam is normal to palpation pressure and position  There are no pathologic reflexes.  Balance is normal.   Exam of the left foot  Inspection left foot and ankle exam shows a scar in the lateral ankle consistent with a peroneal tendon reconstruction she has some mild swelling there. She has some tenderness in the Achilles and retrocalcaneal bursa and point tenderness over the plantar fascia calcaneal insertion. Range of motion at the ankle remains normal ankle is stable she has no strength deficits no atrophy skin is otherwise normal.  An x-ray from several years ago shows talonavicular arthritis small plantar fascia protrusion without significant spur and  retrocalcaneal spur  Diagnosis plantar fasciitis recommend  heel cups  Continue ice and exercise followup in 6 weeks we did give her an injection as noted below  Procedure injection plantar fascia  Verbal consent was obtained   Time out completed   The left foot was injected  Under sterile conditions the plantar fascia  was injected with Depomedrol 40 mg / ml (1 ml) and lidocaine 1% (4 ml)  There were no complications

## 2012-12-15 ENCOUNTER — Telehealth: Payer: Self-pay | Admitting: *Deleted

## 2012-12-15 ENCOUNTER — Encounter: Payer: Self-pay | Admitting: Endocrinology

## 2012-12-15 ENCOUNTER — Ambulatory Visit (INDEPENDENT_AMBULATORY_CARE_PROVIDER_SITE_OTHER): Payer: BC Managed Care – PPO | Admitting: Internal Medicine

## 2012-12-15 ENCOUNTER — Ambulatory Visit (INDEPENDENT_AMBULATORY_CARE_PROVIDER_SITE_OTHER): Payer: BC Managed Care – PPO | Admitting: Endocrinology

## 2012-12-15 ENCOUNTER — Encounter: Payer: Self-pay | Admitting: Internal Medicine

## 2012-12-15 VITALS — BP 130/72 | HR 114 | Temp 98.4°F | Wt 182.4 lb

## 2012-12-15 VITALS — BP 132/80 | HR 80 | Ht 65.0 in | Wt 180.0 lb

## 2012-12-15 DIAGNOSIS — J4520 Mild intermittent asthma, uncomplicated: Secondary | ICD-10-CM

## 2012-12-15 DIAGNOSIS — IMO0001 Reserved for inherently not codable concepts without codable children: Secondary | ICD-10-CM

## 2012-12-15 DIAGNOSIS — J45909 Unspecified asthma, uncomplicated: Secondary | ICD-10-CM

## 2012-12-15 DIAGNOSIS — B370 Candidal stomatitis: Secondary | ICD-10-CM

## 2012-12-15 MED ORDER — FLUCONAZOLE 150 MG PO TABS: 150.0000 mg | ORAL_TABLET | Freq: Once | ORAL | Status: DC
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
## 2012-12-15 NOTE — Telephone Encounter (Signed)
Pt called to speak to this nurse to explain why her apt had to be moved, I advised per increase activity in the hospital we had to reschedule her apt with Dr. Purvis Sheffield to the 25th, the pt understood and accepted an apt with [facility] NP on 12-23-12 at 2:30pm, apologized for inconvience pt understood

## 2012-12-15 NOTE — Patient Instructions (Addendum)
check your blood sugar 2 times a day.  vary the time of day when you check, between before the 3 meals, and at bedtime.  also check if you have symptoms of your blood sugar being too high or too low.  please keep a record of the readings and bring it to your next appointment here.  please call us sooner if your blood sugar goes below 70, or if you have a lot of readings over 200. Please continue the same levemir Please reduce the humalog to 3 times a day (just before each meal) 22-15-22 units. Take 4 extra units for any blood sugar in the 200's, and 8 extra for any over 300.  Please come back for a follow-up appointment in 2 weeks.

## 2012-12-15 NOTE — Progress Notes (Signed)
Subjective:    Patient ID: [name], female    DOB: May 05, 1950, 62 y.o.   MRN: [id number]  HPI pt returns for f/u of insulin-requiring DM (dx'ed 1994; she has mild if any neuropathy of the lower extremities; she is unaware of any associated chronic complications.  she has been on insulin since 2012).  She finished a course of prednisone yesterday.  cbg's have been in the 300's.  Prior to the prednisone, she had a cbg of 70 in the afternoon. Past Medical History  Diagnosis Date  . Hyperlipidemia   . Hypertension   . Diabetes mellitus   . Asthma   . 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 Hillis Range, residual palpitations  . Vocal cord dysfunction   . Esophageal reflux   . Tremor, essential     Neurontin 600 mg qhs x 1 yr--no help.  Inderal prior to this was helpful for 10 yrs then stopped working.  . Anxiety   . Allergic rhinitis   . Low sodium syndrome   . Depression     Past Surgical History  Procedure Laterality Date  . Tonsillectomy    . Right breast cyst      benign  . Left ankle ligament repair    . Left elbow repair    . Partial hysterectomy    . Cholecystectomy    . Cesarean section    . Ablation for avnrt    . Colonoscopy  01/16/2004    WUJ:WJXBJY rectum/colon  . Esophagogastroduodenoscopy (egd) with esophageal dilation  04/03/2002    NWG:[id number]'M ring, otherwise normal esophagus, status post dilation with 56 F/Normal stomach  . Bartholin gland cyst excision    . Esophagogastroduodenoscopy (egd) with esophageal dilation N/A 11/08/2012    Procedure: ESOPHAGOGASTRODUODENOSCOPY (EGD) WITH ESOPHAGEAL DILATION;  Surgeon: Corbin Ade, MD;  Location: [facility];  Service: Endoscopy;  Laterality: N/A;  11:30  . Abdominal hysterectomy      History   Social History  . Marital Status: Married    Spouse Name: N/A    Number of Children: N/A  . Years of Education: N/A   Occupational History  . Not on file.   Social  History Main Topics  . Smoking status: Former Smoker -- 0.30 packs/day for 10 years    Types: Cigarettes    Quit date: 04/06/1985  . Smokeless tobacco: Never Used  . Alcohol Use: No  . Drug Use: No  . Sexual Activity: No   Other Topics Concern  . Not on file   Social History Narrative   Married, 1 daughte, living.  Lives with husband in [place], [place].   1 child died brain tumor at age 44.   Two grandchildren.   Works at [employer], patient currently lost her job.   Retired 2011.   No tobacco.   Alcohol: none in 30 yrs.  Distant history of heavy use.   No drug use.    Current Outpatient Prescriptions on File Prior to Visit  Medication Sig Dispense Refill  . albuterol (PROAIR HFA) 108 (90 BASE) MCG/ACT inhaler Inhale 2 puffs into the lungs every 6 (six) hours as needed for wheezing.      Marland Kitchen albuterol (PROVENTIL) (2.5 MG/3ML) 0.083% nebulizer solution Take 2.5 mg by nebulization every 6 (six) hours as needed for wheezing or shortness of breath.      Marland Kitchen aspirin EC 81 MG tablet Take 81 mg by mouth every morning.       Marland Kitchen  cyclobenzaprine (FLEXERIL) 10 MG tablet Take 5 mg by mouth 3 (three) times daily as needed for muscle spasms.       Marland Kitchen dexlansoprazole (DEXILANT) 60 MG capsule Take 60 mg by mouth daily.      . diphenhydrAMINE (SOMINEX) 25 MG tablet Take 25-50 mg by mouth at bedtime as needed for allergies or sleep.      . DULoxetine (CYMBALTA) 60 MG capsule Take 1 capsule (60 mg total) by mouth daily.  30 capsule  2  . furosemide (LASIX) 20 MG tablet 1 daily as needed- diuretic  10 tablet  0  . HYDROcodone-acetaminophen (NORCO) 5-325 MG per tablet Take 1 tablet by mouth every 4 (four) hours as needed for pain.  15 tablet  0  . Insulin Detemir (LEVEMIR FLEXTOUCH) 100 UNIT/ML SOPN Inject 50 Units into the skin at bedtime.  10 pen  11  . levothyroxine (SYNTHROID, LEVOTHROID) 75 MCG tablet Take 1 tablet (75 mcg total) by mouth daily before breakfast.  30 tablet  0  . LORazepam  (ATIVAN) 1 MG tablet Take 1 tablet (1 mg total) by mouth 3 (three) times daily.  90 tablet  2  . metoprolol succinate (TOPROL-XL) 50 MG 24 hr tablet Take 50 mg by mouth 2 (two) times daily. Take with or immediately following a meal.      . mometasone (NASONEX) 50 MCG/ACT nasal spray Place 2 sprays into the nose every morning.       . mometasone-formoterol (DULERA) 100-5 MCG/ACT AERO Inhale 1 puff into the lungs 2 (two) times daily.      Marland Kitchen olmesartan-hydrochlorothiazide (BENICAR HCT) 40-25 MG per tablet Take 1 tablet by mouth daily.  30 tablet  1  . primidone (MYSOLINE) 50 MG tablet Take 150 mg by mouth 2 (two) times daily.      . promethazine-codeine (PHENERGAN WITH CODEINE) 6.25-10 MG/5ML syrup Take 5 mLs by mouth 2 (two) times daily as needed for cough.  120 mL  0  . Pseudoeph-Doxylamine-DM-APAP (NYQUIL PO) Take 5 mLs by mouth at bedtime as needed (sinus drainage).      . sertraline (ZOLOFT) 100 MG tablet Take 100 mg by mouth at bedtime.        No current facility-administered medications on file prior to visit.    Allergies  Allergen Reactions  . Guaifenesin     MUCINEX REACTION: asthma attacks  . Oxycodone-Acetaminophen     TYLOX.  . Guaifenesin Er     Family History  Problem Relation Age of Onset  . Lung cancer Father     DIED AGE 50 LUNG CA  . Alcohol abuse Father   . Anxiety disorder Father   . Depression Father   . COPD Mother     DIED AGE 37,MRSA,COPD,PNEUMONIA  . Pneumonia Mother     DIED AGE 37,MRSA,COPD,PNEUMONIA  . Supraventricular tachycardia Mother   . Anxiety disorder Mother   . Depression Mother   . Alcohol abuse Mother   . COPD Brother     AGE 76  . Heart disease Sister     DIED AGE 4, SMOKER,?HEART  . Colon cancer Neg Hx     BP 132/80  Pulse 80  Ht 5\' 5"  (1.651 m)  Wt 180 lb (81.647 kg)  BMI 29.95 kg/m2  SpO2 97%  Review of Systems denies hypoglycemia.  She has gained weight.      Objective:   Physical Exam VITAL SIGNS:  See vs  page GENERAL: no distress SKIN:  Insulin injection sites  at the anterior abdomen are normal, except for a few ecchymoses.     Assessment & Plan:  DM: prior to the prednisone, she was found to probably need less insulin at lunch.  However, now she needs increased rx. Asthma: the prednisone complicates the rx of her DM, but she needs it.

## 2012-12-15 NOTE — Patient Instructions (Addendum)
Script for diflucan for yeast   Ok to continue your regular asthma meds

## 2012-12-16 ENCOUNTER — Encounter (HOSPITAL_COMMUNITY): Payer: Self-pay | Admitting: Emergency Medicine

## 2012-12-16 ENCOUNTER — Emergency Department (HOSPITAL_COMMUNITY)
Admission: EM | Admit: 2012-12-16 | Discharge: 2012-12-17 | Disposition: A | Discharge status: Home / Self Care | Payer: BC Managed Care – PPO | Attending: Emergency Medicine | Admitting: Emergency Medicine

## 2012-12-16 DIAGNOSIS — R4182 Altered mental status, unspecified: Secondary | ICD-10-CM | POA: Insufficient documentation

## 2012-12-16 DIAGNOSIS — F3289 Other specified depressive episodes: Secondary | ICD-10-CM | POA: Insufficient documentation

## 2012-12-16 DIAGNOSIS — Z8709 Personal history of other diseases of the respiratory system: Secondary | ICD-10-CM | POA: Insufficient documentation

## 2012-12-16 DIAGNOSIS — Z87891 Personal history of nicotine dependence: Secondary | ICD-10-CM | POA: Insufficient documentation

## 2012-12-16 DIAGNOSIS — F329 Major depressive disorder, single episode, unspecified: Secondary | ICD-10-CM | POA: Insufficient documentation

## 2012-12-16 DIAGNOSIS — IMO0002 Reserved for concepts with insufficient information to code with codable children: Secondary | ICD-10-CM | POA: Insufficient documentation

## 2012-12-16 DIAGNOSIS — Z8719 Personal history of other diseases of the digestive system: Secondary | ICD-10-CM | POA: Insufficient documentation

## 2012-12-16 DIAGNOSIS — R3915 Urgency of urination: Secondary | ICD-10-CM | POA: Insufficient documentation

## 2012-12-16 DIAGNOSIS — Z862 Personal history of diseases of the blood and blood-forming organs and certain disorders involving the immune mechanism: Secondary | ICD-10-CM | POA: Insufficient documentation

## 2012-12-16 DIAGNOSIS — F411 Generalized anxiety disorder: Secondary | ICD-10-CM | POA: Insufficient documentation

## 2012-12-16 DIAGNOSIS — I10 Essential (primary) hypertension: Secondary | ICD-10-CM | POA: Insufficient documentation

## 2012-12-16 DIAGNOSIS — Z8639 Personal history of other endocrine, nutritional and metabolic disease: Secondary | ICD-10-CM | POA: Insufficient documentation

## 2012-12-16 DIAGNOSIS — J45909 Unspecified asthma, uncomplicated: Secondary | ICD-10-CM | POA: Insufficient documentation

## 2012-12-16 DIAGNOSIS — E119 Type 2 diabetes mellitus without complications: Secondary | ICD-10-CM | POA: Insufficient documentation

## 2012-12-16 DIAGNOSIS — E871 Hypo-osmolality and hyponatremia: Secondary | ICD-10-CM | POA: Insufficient documentation

## 2012-12-16 DIAGNOSIS — R197 Diarrhea, unspecified: Secondary | ICD-10-CM | POA: Insufficient documentation

## 2012-12-16 DIAGNOSIS — Z79899 Other long term (current) drug therapy: Secondary | ICD-10-CM | POA: Insufficient documentation

## 2012-12-16 DIAGNOSIS — R350 Frequency of micturition: Secondary | ICD-10-CM | POA: Insufficient documentation

## 2012-12-16 NOTE — ED Notes (Signed)
Pt c/o diarrhea, thirstiness, dizziness and confused.

## 2012-12-16 NOTE — ED Provider Notes (Signed)
CSN: [id number]     Arrival date & time 12/16/12  2316 History   First MD Initiated Contact with Patient 12/16/12 2337     Chief Complaint  Patient presents with  . Dizziness  . Altered Mental Status   (Consider location/radiation/quality/duration/timing/severity/associated sxs/prior Treatment) Patient is a 62 y.o. female presenting with altered mental status. The history is provided by the patient.  Altered Mental Status She had onset yesterday of diarrhea and being very thirsty. There's been associated urinary frequency and urgency but no dysuria. She denies nausea or vomiting. Symptoms have continued today although diarrhea has eased and she doesn't feel like she is going to have any more diarrhea. She's also noticed some dizziness which is worse when she walks around a corner. She feels a little of balance with some sense of the room spinning. She has vague complaints of confusion but cannot explain exactly how she is confused. She is concerned because she missed her lunchtime dose of Humalog. Evening blood sugar was 202. She denies fever, chills, sweats. She denies chest pain, heaviness, tightness, pressure. There is mild abdominal cramping and she rates pain at 3/10. She has not taken any medication to try and help her symptoms.  Past Medical History  Diagnosis Date  . Hyperlipidemia   . Hypertension   . Diabetes mellitus   . Asthma   . 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 Hillis Range, residual palpitations  . Vocal cord dysfunction   . Esophageal reflux   . Tremor, essential     Neurontin 600 mg qhs x 1 yr--no help.  Inderal prior to this was helpful for 10 yrs then stopped working.  . Anxiety   . Allergic rhinitis   . Low sodium syndrome   . Depression    Past Surgical History  Procedure Laterality Date  . Tonsillectomy    . Right breast cyst      benign  . Left ankle ligament repair    . Left elbow repair    . Partial hysterectomy     . Cholecystectomy    . Cesarean section    . Ablation for avnrt    . Colonoscopy  01/16/2004    XLK:[id number] rectum/colon  . Esophagogastroduodenoscopy (egd) with esophageal dilation  04/03/2002    VOZ:[id number] ring, otherwise normal esophagus, status post dilation with 56 F/Normal stomach  . Bartholin gland cyst excision    . Esophagogastroduodenoscopy (egd) with esophageal dilation N/A 11/08/2012    Procedure: ESOPHAGOGASTRODUODENOSCOPY (EGD) WITH ESOPHAGEAL DILATION;  Surgeon: Corbin Ade, MD;  Location: [facility];  Service: Endoscopy;  Laterality: N/A;  11:30  . Abdominal hysterectomy     Family History  Problem Relation Age of Onset  . Lung cancer Father     DIED AGE 38 LUNG CA  . Alcohol abuse Father   . Anxiety disorder Father   . Depression Father   . COPD Mother     DIED AGE 51,MRSA,COPD,PNEUMONIA  . Pneumonia Mother     DIED AGE 51,MRSA,COPD,PNEUMONIA  . Supraventricular tachycardia Mother   . Anxiety disorder Mother   . Depression Mother   . Alcohol abuse Mother   . COPD Brother     AGE 79  . Heart disease Sister     DIED AGE 20, SMOKER,?HEART  . Colon cancer Neg Hx    History  Substance Use Topics  . Smoking status: Former Smoker -- 0.30 packs/day for 10 years  Types: Cigarettes    Quit date: 04/06/1985  . Smokeless tobacco: Never Used  . Alcohol Use: No   OB History   Grav Para Term Preterm Abortions TAB SAB Ect Mult Living                 Review of Systems  All other systems reviewed and are negative.    Allergies  Guaifenesin; Oxycodone-acetaminophen; and Guaifenesin er  Home Medications   Current Outpatient Rx  Name  Route  Sig  Dispense  Refill  . albuterol (PROAIR HFA) 108 (90 BASE) MCG/ACT inhaler   Inhalation   Inhale 2 puffs into the lungs every 6 (six) hours as needed for wheezing.         Marland Kitchen albuterol (PROVENTIL) (2.5 MG/3ML) 0.083% nebulizer solution   Nebulization   Take 2.5 mg by nebulization every 6 (six) hours as  needed for wheezing or shortness of breath.         Marland Kitchen aspirin EC 81 MG tablet   Oral   Take 81 mg by mouth every morning.          . cyclobenzaprine (FLEXERIL) 10 MG tablet   Oral   Take 5 mg by mouth 3 (three) times daily as needed for muscle spasms.          Marland Kitchen dexlansoprazole (DEXILANT) 60 MG capsule   Oral   Take 60 mg by mouth daily.         . diphenhydrAMINE (SOMINEX) 25 MG tablet   Oral   Take 25-50 mg by mouth at bedtime as needed for allergies or sleep.         . DULoxetine (CYMBALTA) 60 MG capsule   Oral   Take 1 capsule (60 mg total) by mouth daily.   30 capsule   2   . fluconazole (DIFLUCAN) 150 MG tablet   Oral   Take 1 tablet (150 mg total) by mouth once. daily   4 tablet   0   . furosemide (LASIX) 20 MG tablet      1 daily as needed- diuretic   10 tablet   0   . HYDROcodone-acetaminophen (NORCO) 5-325 MG per tablet   Oral   Take 1 tablet by mouth every 4 (four) hours as needed for pain.   15 tablet   0   . Insulin Detemir (LEVEMIR FLEXTOUCH) 100 UNIT/ML SOPN   Subcutaneous   Inject 50 Units into the skin at bedtime.   10 pen   11   . Insulin Lispro, Human, (HUMALOG KWIKPEN [place])      3 times a day (just before each meal) 22-15-22 units.         Marland Kitchen levothyroxine (SYNTHROID, LEVOTHROID) 75 MCG tablet   Oral   Take 1 tablet (75 mcg total) by mouth daily before breakfast.   30 tablet   0   . LORazepam (ATIVAN) 1 MG tablet   Oral   Take 1 tablet (1 mg total) by mouth 3 (three) times daily.   90 tablet   2   . metoprolol succinate (TOPROL-XL) 50 MG 24 hr tablet   Oral   Take 50 mg by mouth 2 (two) times daily. Take with or immediately following a meal.         . mometasone (NASONEX) 50 MCG/ACT nasal spray   Nasal   Place 2 sprays into the nose every morning.          . mometasone-formoterol (DULERA) 100-5 MCG/ACT AERO  Inhalation   Inhale 1 puff into the lungs 2 (two) times daily.         Marland Kitchen  olmesartan-hydrochlorothiazide (BENICAR HCT) 40-25 MG per tablet   Oral   Take 1 tablet by mouth daily.   30 tablet   1   . primidone (MYSOLINE) 50 MG tablet   Oral   Take 150 mg by mouth 2 (two) times daily.         . promethazine-codeine (PHENERGAN WITH CODEINE) 6.25-10 MG/5ML syrup   Oral   Take 5 mLs by mouth 2 (two) times daily as needed for cough.   120 mL   0   . Pseudoeph-Doxylamine-DM-APAP (NYQUIL PO)   Oral   Take 5 mLs by mouth at bedtime as needed (sinus drainage).         . sertraline (ZOLOFT) 100 MG tablet   Oral   Take 100 mg by mouth at bedtime.           BP 133/74  Pulse 96  Temp(Src) 98.2 F (36.8 C) (Oral)  Resp 18  Ht 5' 4.5" (1.638 m)  Wt 180 lb (81.647 kg)  BMI 30.43 kg/m2  SpO2 99% Physical Exam  Nursing note and vitals reviewed.  62 year old female, resting comfortably and in no acute distress. Vital signs are normal. Oxygen saturation is 99%, which is normal. Head is normocephalic and atraumatic. PERRLA, EOMI. Oropharynx is clear. Neck is nontender and supple without adenopathy or JVD. Back is nontender and there is no CVA tenderness. Lungs are clear without rales, wheezes, or rhonchi. Chest is nontender. Heart has regular rate and rhythm without murmur. Abdomen is soft, flat, with mild tenderness in the right midabdomen. There is no rebound or guarding. There are no masses or hepatosplenomegaly and peristalsis is normoactive. Extremities have no cyanosis or edema, full range of motion is present. Skin is warm and dry without rash. Neurologic: Mental status is normal, cranial nerves are intact, there are no motor or sensory deficits. Dizziness is not affected by head movement. Gait is not tested.  ED Course  Procedures (including critical care time) Labs Review Results for orders placed during the hospital encounter of 12/16/12  URINALYSIS, ROUTINE W REFLEX MICROSCOPIC      Result Value Range   Color, Urine YELLOW  YELLOW    APPearance CLEAR  CLEAR   Specific Gravity, Urine <1.005 (*) 1.005 - 1.030   pH 6.5  5.0 - 8.0   Glucose, UA NEGATIVE  NEGATIVE mg/dL   Hgb urine dipstick NEGATIVE  NEGATIVE   Bilirubin Urine NEGATIVE  NEGATIVE   Ketones, ur NEGATIVE  NEGATIVE mg/dL   Protein, ur NEGATIVE  NEGATIVE mg/dL   Urobilinogen, UA 0.2  0.0 - 1.0 mg/dL   Nitrite NEGATIVE  NEGATIVE   Leukocytes, UA NEGATIVE  NEGATIVE  CBC WITH DIFFERENTIAL      Result Value Range   WBC 8.4  4.0 - 10.5 K/uL   RBC 3.84 (*) 3.87 - 5.11 MIL/uL   Hemoglobin 11.5 (*) 12.0 - 15.0 g/dL   HCT 19.1 (*) 47.8 - 29.5 %   MCV 87.5  78.0 - 100.0 fL   MCH 29.9  26.0 - 34.0 pg   MCHC 34.2  30.0 - 36.0 g/dL   RDW 62.1  30.8 - 65.7 %   Platelets 232  150 - 400 K/uL   Neutrophils Relative % 52  43 - 77 %   Neutro Abs 4.3  1.7 - 7.7 K/uL   Lymphocytes  Relative 36  12 - 46 %   Lymphs Abs 3.0  0.7 - 4.0 K/uL   Monocytes Relative 7  3 - 12 %   Monocytes Absolute 0.6  0.1 - 1.0 K/uL   Eosinophils Relative 6 (*) 0 - 5 %   Eosinophils Absolute 0.5  0.0 - 0.7 K/uL   Basophils Relative 0  0 - 1 %   Basophils Absolute 0.0  0.0 - 0.1 K/uL  COMPREHENSIVE METABOLIC PANEL      Result Value Range   Sodium 127 (*) 135 - 145 mEq/L   Potassium 3.7  3.5 - 5.1 mEq/L   Chloride 91 (*) 96 - 112 mEq/L   CO2 27  19 - 32 mEq/L   Glucose, Bld 211 (*) 70 - 99 mg/dL   BUN 6  6 - 23 mg/dL   Creatinine, Ser 1.61  0.50 - 1.10 mg/dL   Calcium 9.0  8.4 - 09.6 mg/dL   Total Protein 6.6  6.0 - 8.3 g/dL   Albumin 3.7  3.5 - 5.2 g/dL   AST 15  0 - 37 U/L   ALT 16  0 - 35 U/L   Alkaline Phosphatase 77  39 - 117 U/L   Total Bilirubin 0.2 (*) 0.3 - 1.2 mg/dL   GFR calc non Af Amer >90  >90 mL/min   GFR calc Af Amer >90  >90 mL/min  LACTIC ACID, PLASMA      Result Value Range   Lactic Acid, Venous 1.2  0.5 - 2.2 mmol/L   ECG shows normal sinus rhythm with a rate of 92, no ectopy. Normal axis. Normal P wave. Normal QRS. Normal intervals. Normal ST and T waves.  Impression: normal ECG. When compared with ECG of 12/05/1998 410, no significant changes are seen.  MDM  No diagnosis found. Diarrhea of uncertain cause but seems to have resolved. Mild abdominal pain presumably from the same cause of her diarrhea. Vague confusion of uncertain cause. She does relate a history of hyponatremia and review of prior records shows that her low sodium was 126. Screening labs will be obtained as well as urinalysis. Orthostatic vital signs will be checked.  Workup is significant only for sodium of 127. Orthostatic vital signs show no drop in blood pressure or rise in pulse and she was asymptomatic during orthostatic testing. She does have mild to moderate hyperglycemia which probably accounts for her thirst. She's given a bolus of normal saline and is discharged with instructions to follow up with PCP in the next week. She is to return if symptoms are getting worse.  Dione Booze, MD 12/17/12 [phone]

## 2012-12-17 LAB — CBC WITH DIFFERENTIAL/PLATELET
Basophils Absolute: 0 10*3/uL (ref 0.0–0.1)
Basophils Relative: 0 % (ref 0–1)
Eosinophils Absolute: 0.5 10*3/uL (ref 0.0–0.7)
Eosinophils Relative: 6 % — ABNORMAL HIGH (ref 0–5)
HCT: 33.6 % — ABNORMAL LOW (ref 36.0–46.0)
Hemoglobin: 11.5 g/dL — ABNORMAL LOW (ref 12.0–15.0)
Lymphocytes Relative: 36 % (ref 12–46)
Lymphs Abs: 3 10*3/uL (ref 0.7–4.0)
MCH: 29.9 pg (ref 26.0–34.0)
MCHC: 34.2 g/dL (ref 30.0–36.0)
MCV: 87.5 fL (ref 78.0–100.0)
Monocytes Absolute: 0.6 10*3/uL (ref 0.1–1.0)
Monocytes Relative: 7 % (ref 3–12)
Neutro Abs: 4.3 10*3/uL (ref 1.7–7.7)
Neutrophils Relative %: 52 % (ref 43–77)
Platelets: 232 10*3/uL (ref 150–400)
RBC: 3.84 MIL/uL — ABNORMAL LOW (ref 3.87–5.11)
RDW: 12.7 % (ref 11.5–15.5)
WBC: 8.4 10*3/uL (ref 4.0–10.5)

## 2012-12-17 LAB — URINALYSIS, ROUTINE W REFLEX MICROSCOPIC
Bilirubin Urine: NEGATIVE
Glucose, UA: NEGATIVE mg/dL
Hgb urine dipstick: NEGATIVE
Ketones, ur: NEGATIVE mg/dL
Leukocytes, UA: NEGATIVE
Nitrite: NEGATIVE
Protein, ur: NEGATIVE mg/dL
Specific Gravity, Urine: <1.005 — ABNORMAL LOW (ref 1.005–1.030)
Urobilinogen, UA: 0.2 mg/dL (ref 0.0–1.0)
pH: 6.5 (ref 5.0–8.0)

## 2012-12-17 LAB — COMPREHENSIVE METABOLIC PANEL
ALT: 16 U/L (ref 0–35)
AST: 15 U/L (ref 0–37)
Albumin: 3.7 g/dL (ref 3.5–5.2)
Alkaline Phosphatase: 77 U/L (ref 39–117)
BUN: 6 mg/dL (ref 6–23)
CO2: 27 mEq/L (ref 19–32)
Calcium: 9 mg/dL (ref 8.4–10.5)
Chloride: 91 mEq/L — ABNORMAL LOW (ref 96–112)
Creatinine, Ser: 0.58 mg/dL (ref 0.50–1.10)
GFR calc Af Amer: >90 mL/min (ref >90)
GFR calc non Af Amer: >90 mL/min (ref >90)
Glucose, Bld: 211 mg/dL — ABNORMAL HIGH (ref 70–99)
Potassium: 3.7 mEq/L (ref 3.5–5.1)
Sodium: 127 mEq/L — ABNORMAL LOW (ref 135–145)
Total Bilirubin: 0.2 mg/dL — ABNORMAL LOW (ref 0.3–1.2)
Total Protein: 6.6 g/dL (ref 6.0–8.3)

## 2012-12-17 LAB — LACTIC ACID, PLASMA: Lactic Acid, Venous: 1.2 mmol/L (ref 0.5–2.2)

## 2012-12-17 MED ORDER — SODIUM CHLORIDE 0.9 % IV BOLUS (SEPSIS)
1000.0000 mL | Freq: Once | INTRAVENOUS | Status: AC
  Administered 2012-12-17: 1000 mL via INTRAVENOUS

## 2012-12-22 ENCOUNTER — Ambulatory Visit (INDEPENDENT_AMBULATORY_CARE_PROVIDER_SITE_OTHER): Payer: BC Managed Care – PPO | Admitting: Family Medicine

## 2012-12-22 ENCOUNTER — Encounter: Payer: Self-pay | Admitting: Family Medicine

## 2012-12-22 ENCOUNTER — Telehealth: Payer: Self-pay | Admitting: Family Medicine

## 2012-12-22 VITALS — BP 144/90 | HR 88 | Wt 185.0 lb

## 2012-12-22 DIAGNOSIS — M722 Plantar fascial fibromatosis: Secondary | ICD-10-CM

## 2012-12-22 NOTE — Progress Notes (Signed)
  I'm seeing this patient by the request  of:  Dr. Yetta Barre  CC: Left heel pain  HPI: Patient is a 62 year old female coming in with left heel pain. Patient states that she's had this history pain intermittently since 2001. Patient did have a posterior lateral ankle reconstruction about that time. Patient states over the course of the last month it has seem to be worsening significantly. Patient did go see an orthopedic surgeon recently and did have a steroid injection into the left heel. Patient states that this has not made any significant improvement at this time. Patient states that she continues to have pain on the medial aspect of her heel that is worse in the morning. Patient states she also has some severe sharp pain after taking the first of the morning as well as after sitting for a long amount of time. Patient states it does feel better when she doesn't icing. Patient puts the pain 10 out of 10.  Past medical, surgical, family and social history reviewed. Medications reviewed all in the electronic medical record.   Review of Systems: No headache, visual changes, nausea, vomiting, diarrhea, constipation, dizziness, abdominal pain, skin rash, fevers, chills, night sweats, weight loss, swollen lymph nodes, body aches, joint swelling, muscle aches, chest pain, shortness of breath, mood changes.   Objective:    Blood pressure 144/90, pulse 88, weight 185 lb (83.915 kg), SpO2 97.00%.   General: No apparent distress alert and oriented x3 mood and affect normal, dressed appropriately. Patient does have a resting tremor HEENT: Pupils equal, extraocular movements intact Respiratory: Patient's speak in full sentences and does not appear short of breath Cardiovascular: No lower extremity edema, non tender, no erythema Skin: Warm dry intact with no signs of infection or rash on extremities or on axial skeleton. Abdomen: Soft nontender Neuro: Cranial nerves II through XII are intact, neurovascularly  intact in all extremities with 2+ DTRs and 2+ pulses. Lymph: No lymphadenopathy of posterior or anterior cervical chain or axillae bilaterally.  Gait normal with good balance and coordination.  MSK: Non tender with full range of motion and good stability and symmetric strength and tone of shoulders, elbows, wrist, hip, knees bilaterally.  Foot exam Normal inspection with no visable or palpable fat pad atrophy and no visible swelling/erythema. Patient is tender at medial insertion of plantar fascia into calcaneus. Great toe motion: Mild hallux rigidus on the left side compared to the contralateral side Arch shape: Patient has some mild breakdown the transverse and longitudinal bilateral Other foot breakdown: None  Musculoskeletal ultrasound was performed and interpreted by me today. Patient's ultrasound shows she does have some hypoechoic changes at the insertion of the plantar fascia on the calcaneus. There is a tear appreciated. This measures approximately 0.2 mm in length. Patient does have some mild neovascularization occurring. Patient fashion measures 0.81cm   Impression and Recommendations:     This case required medical decision making of moderate complexity.

## 2012-12-22 NOTE — Assessment & Plan Note (Addendum)
had injection back on December 14, 2012 by Dr. Romeo Apple Plantar Fascitis: We reviewed that stretching is critically important to the treatment of PF. Reviewed footwear. Rigid soles have been shown to help with PF. Night splints can help. Reviewed rehab of stretching and calf raises.  Could benefit from a corticosteroid injection, orthotics, or other measures if conservative treatment fails. Due to tear in patient's ambulation patient was put in a Cam Walker for the next 2-4 weeks. Patient will come back again at that time and will transition more to a shoe. We'll discuss over-the-counter orthotics and possibly formal physical therapy at that time. She'll follow up in 4 weeks

## 2012-12-22 NOTE — Patient Instructions (Addendum)
Very nice to meet you Please read handouts on Plantar Fascitis.  STRETCHING and Strengthening program critically important.  Strengthening on foot and calf muscles as seen in handout. Calf raises, 2 legged, then 1 legged. Foot massage with tennis ball. Ice baths 2 times a day Wear the boot for next 4 weeks.   Towel Scrunches: get a towel or hand towel, use toes to pick up and scrunch up the towel.  Marble pick-ups, practice picking up marbles with toes and placing into a cup  NEEDS TO BE DONE EVERY DAY  Recommended over the counter insoles. Spenco  A rigid shoe with good arch support helps: Dansko (great), Randel Pigg, Merrell No easily bendable shoes.   Tuli's heel cups  Come back and see me in 4 weeks.

## 2012-12-22 NOTE — Telephone Encounter (Signed)
12/22/2012  Pt. Wants to know what should be taken for pain.  Pt stated that they have Presricption Ibuprofen at home, but was wondering she they needed to take anything else.

## 2012-12-23 ENCOUNTER — Ambulatory Visit (INDEPENDENT_AMBULATORY_CARE_PROVIDER_SITE_OTHER): Payer: BC Managed Care – PPO | Admitting: Adult Health

## 2012-12-23 ENCOUNTER — Encounter: Payer: Self-pay | Admitting: Adult Health

## 2012-12-23 VITALS — BP 103/71 | HR 100 | Ht 64.5 in | Wt 186.0 lb

## 2012-12-23 DIAGNOSIS — J4520 Mild intermittent asthma, uncomplicated: Secondary | ICD-10-CM

## 2012-12-23 DIAGNOSIS — I10 Essential (primary) hypertension: Secondary | ICD-10-CM

## 2012-12-23 DIAGNOSIS — IMO0001 Reserved for inherently not codable concepts without codable children: Secondary | ICD-10-CM

## 2012-12-23 DIAGNOSIS — I498 Other specified cardiac arrhythmias: Secondary | ICD-10-CM

## 2012-12-23 DIAGNOSIS — J45909 Unspecified asthma, uncomplicated: Secondary | ICD-10-CM

## 2012-12-23 MED ORDER — OLMESARTAN MEDOXOMIL 40 MG PO TABS: 40.0000 mg | ORAL_TABLET | Freq: Every day | ORAL | Status: DC

## 2012-12-23 NOTE — Patient Instructions (Addendum)
Your physician recommends that you schedule a follow-up appointment in: 3 MONTHS WITH KL NP/ONE WEEK NURSE VISIT  Your physician recommends that you return for lab work in: ONE WEEK FOR BMET The patient's paper medical record is not available during this visit. It has been removed from this office and cannot be located.  Your physician has recommended you make the following change in your medication:   1) STOP TAKING BENICAR/HCTZ 2) START TAKING BENICAR 40MG 

## 2012-12-23 NOTE — Telephone Encounter (Signed)
Left detailed msg on pt's vmail.  

## 2012-12-23 NOTE — Assessment & Plan Note (Signed)
She has had a bout of bronchitis with frequent coughing with some chest wall pain associated. Will continue follow up with PCP.

## 2012-12-23 NOTE — Addendum Note (Signed)
Addended by: Derry Lory A on: 12/23/2012 04:18 PM   Modules accepted: Orders

## 2012-12-23 NOTE — Progress Notes (Signed)
Name: [name]    DOB: [birth year]  Age: 62 y.o.  MR#: [id number]       PCP:  Sanda Linger, MD      Insurance: Payor: BLUE CROSS BLUE SHIELD / Plan: BCBS [place] PPO / Product Type: *No Product type* /   CC:    Chief Complaint  Patient presents with  . Tachycardia    AV nodal    VS Filed Vitals:   12/23/12 1359  BP: 105/71  Pulse: 93  Height: 5' 4.5" (1.638 m)  Weight: 186 lb (84.369 kg)    Weights Current Weight  12/23/12 186 lb (84.369 kg)  12/22/12 185 lb (83.915 kg)  12/16/12 180 lb (81.647 kg)    Blood Pressure  BP Readings from Last 3 Encounters:  12/23/12 105/71  12/22/12 144/90  12/17/12 135/76     Admit date:  (Not on file) Last encounter with RMR:  Visit date not found   Allergy Guaifenesin; Oxycodone-acetaminophen; and Guaifenesin er  Current Outpatient Prescriptions  Medication Sig Dispense Refill  . albuterol (PROAIR HFA) 108 (90 BASE) MCG/ACT inhaler Inhale 2 puffs into the lungs every 6 (six) hours as needed for wheezing.      Marland Kitchen albuterol (PROVENTIL) (2.5 MG/3ML) 0.083% nebulizer solution Take 2.5 mg by nebulization every 6 (six) hours as needed for wheezing or shortness of breath.      Marland Kitchen aspirin EC 81 MG tablet Take 81 mg by mouth every morning.       . cyclobenzaprine (FLEXERIL) 10 MG tablet Take 5 mg by mouth 3 (three) times daily as needed for muscle spasms.       Marland Kitchen dexlansoprazole (DEXILANT) 60 MG capsule Take 60 mg by mouth daily.      . diphenhydrAMINE (SOMINEX) 25 MG tablet Take 25-50 mg by mouth at bedtime as needed for allergies or sleep.      . DULoxetine (CYMBALTA) 60 MG capsule Take 1 capsule (60 mg total) by mouth daily.  30 capsule  2  . HYDROcodone-acetaminophen (NORCO) 5-325 MG per tablet Take 1 tablet by mouth every 4 (four) hours as needed for pain.  15 tablet  0  . Insulin Detemir (LEVEMIR FLEXTOUCH) 100 UNIT/ML SOPN Inject 50 Units into the skin at bedtime.  10 pen  11  . Insulin Lispro, Human, (HUMALOG KWIKPEN [place]) 3 times a day  (just before each meal) 22-15-22 units.      Marland Kitchen levothyroxine (SYNTHROID, LEVOTHROID) 75 MCG tablet Take 1 tablet (75 mcg total) by mouth daily before breakfast.  30 tablet  0  . LORazepam (ATIVAN) 1 MG tablet Take 1 tablet (1 mg total) by mouth 3 (three) times daily.  90 tablet  2  . metoprolol succinate (TOPROL-XL) 50 MG 24 hr tablet Take 50 mg by mouth 2 (two) times daily. Take with or immediately following a meal.      . mometasone (NASONEX) 50 MCG/ACT nasal spray Place 2 sprays into the nose every morning.       . mometasone-formoterol (DULERA) 100-5 MCG/ACT AERO Inhale 1 puff into the lungs 2 (two) times daily.      Marland Kitchen olmesartan-hydrochlorothiazide (BENICAR HCT) 40-25 MG per tablet Take 1 tablet by mouth daily.  30 tablet  1  . primidone (MYSOLINE) 50 MG tablet Take 150 mg by mouth 2 (two) times daily.      . promethazine-codeine (PHENERGAN WITH CODEINE) 6.25-10 MG/5ML syrup Take 5 mLs by mouth 2 (two) times daily as needed for cough.  120 mL  0  . Pseudoeph-Doxylamine-DM-APAP (NYQUIL PO) Take 5 mLs by mouth at bedtime as needed (sinus drainage).      . sertraline (ZOLOFT) 100 MG tablet Take 100 mg by mouth at bedtime.        No current facility-administered medications for this visit.    Discontinued Meds:    Medications Discontinued During This Encounter  Medication Reason  . fluconazole (DIFLUCAN) 150 MG tablet Error  . furosemide (LASIX) 20 MG tablet Error    Patient Active Problem List   Diagnosis Date Noted  . Plantar fasciitis, left 12/14/2012  . GERD (gastroesophageal reflux disease) 11/03/2012  . IBS (irritable bowel syndrome) 11/03/2012  . Unspecified constipation 11/03/2012  . Esophageal stenosis 11/01/2012  . Unspecified gastritis and gastroduodenitis without mention of hemorrhage 11/01/2012  . Moderate intermittent asthma 09/01/2012  . Hypothyroidism 12/22/2011  . 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 DEPRESSION 11/20/2008  . HYPERLIPIDEMIA-MIXED 06/22/2008   . PALPITATIONS 06/22/2008  . ESSENTIAL HYPERTENSION 09/04/2007  . Type II or unspecified type diabetes mellitus without mention of complication, uncontrolled 04/19/2007  . ANXIETY 04/19/2007  . TREMOR, ESSENTIAL 04/19/2007  . Seasonal and perennial allergic rhinitis 04/19/2007  . ESOPHAGEAL REFLUX 04/19/2007    LABS    Component Value Date/Time   NA 127* 12/17/2012 0001   NA 136 12/04/2012 1642   NA 126* 10/08/2012 1744   K 3.7 12/17/2012 0001   K 3.9 12/04/2012 1642   K 3.9 10/08/2012 1744   CL 91* 12/17/2012 0001   CL 99 12/04/2012 1642   CL 91* 10/08/2012 1744   CO2 27 12/17/2012 0001   CO2 24 12/04/2012 1642   CO2 28 10/08/2012 1744   GLUCOSE 211* 12/17/2012 0001   GLUCOSE 257* 12/04/2012 1642   GLUCOSE 165* 10/08/2012 1744   BUN 6 12/17/2012 0001   BUN 6 12/04/2012 1642   BUN 9 10/08/2012 1744   CREATININE 0.58 12/17/2012 0001   CREATININE 0.64 12/04/2012 1642   CREATININE 0.71 10/08/2012 1744   CALCIUM 9.0 12/17/2012 0001   CALCIUM 9.5 12/04/2012 1642   CALCIUM 9.2 10/08/2012 1744   GFRNONAA >90 12/17/2012 0001   GFRNONAA >90 12/04/2012 1642   GFRNONAA 90* 10/08/2012 1744   GFRAA >90 12/17/2012 0001   GFRAA >90 12/04/2012 1642   GFRAA >90 10/08/2012 1744   CMP     Component Value Date/Time   NA 127* 12/17/2012 0001   K 3.7 12/17/2012 0001   CL 91* 12/17/2012 0001   CO2 27 12/17/2012 0001   GLUCOSE 211* 12/17/2012 0001   BUN 6 12/17/2012 0001   CREATININE 0.58 12/17/2012 0001   CALCIUM 9.0 12/17/2012 0001   PROT 6.6 12/17/2012 0001   ALBUMIN 3.7 12/17/2012 0001   AST 15 12/17/2012 0001   ALT 16 12/17/2012 0001   ALKPHOS 77 12/17/2012 0001   BILITOT 0.2* 12/17/2012 0001   GFRNONAA >90 12/17/2012 0001   GFRAA >90 12/17/2012 0001       Component Value Date/Time   WBC 8.4 12/17/2012 0001   WBC 8.0 12/04/2012 1642   WBC 7.4 10/08/2012 1744   HGB 11.5* 12/17/2012 0001   HGB 12.3 12/04/2012 1642   HGB 11.6* 10/08/2012 1744   HCT 33.6* 12/17/2012 0001   HCT 35.2* 12/04/2012 1642   HCT 32.9* 10/08/2012 1744    MCV 87.5 12/17/2012 0001   MCV 86.9 12/04/2012 1642   MCV 86.1 10/08/2012 1744    Lipid Panel  No results found for this basename: chol, trig, hdl, cholhdl,  vldl, ldlcalc    ABG    Component Value Date/Time   TCO2 24 10/11/2007 0132     Lab Results  Component Value Date   TSH 0.92 09/01/2012   BNP (last 3 results)  Recent Labs  12/04/12 1659  PROBNP 411.3*   Cardiac Panel (last 3 results) No results found for this basename: CKTOTAL, CKMB, TROPONINI, RELINDX,  in the last 72 hours  Iron/TIBC/Ferritin No results found for this basename: iron, tibc, ferritin     EKG Orders placed during the hospital encounter of 12/16/12  . ED EKG  . ED EKG  . EKG 12-LEAD  . EKG 12-LEAD  . EKG     Prior Assessment and Plan Problem List as of 12/23/2012   ESSENTIAL HYPERTENSION   Last Assessment & Plan   08/11/2012 Office Visit Written 08/20/2012  6:21 PM by Jeoffrey Massed, MD     Stable/normal here today. Continue current meds. Obtain old records for recent lab info.    Type II or unspecified type diabetes mellitus without mention of complication, uncontrolled   Last Assessment & Plan   10/03/2012 Office Visit Edited 10/04/2012  2:58 PM by Jeoffrey Massed, MD     Ongoing poor control. Encouraged better dietary compliance, increased activtity. Increase levemir to 50 U qhs and increase novolog (one sample pen given today) to 22 U sub Q qAC. Will do foot exam and urine microalb/cr at future f/u visit, as this visit was largely taken up by discussion of her depression and psychosocial plight.    HYPERLIPIDEMIA-MIXED   Last Assessment & Plan   02/12/2011 Office Visit Written 02/12/2011 12:44 PM by June Leap, MD     Lipid panel and LFTs will also be obtained by the health department    ANXIETY   DEPRESSION   Last Assessment & Plan   11/10/2012 Office Visit Written 11/23/2012 12:44 PM by Waymon Budge, MD     Chronic anxiety and depression; she is seeing a counselor    TREMOR,  ESSENTIAL   Last Assessment & Plan   10/03/2012 Office Visit Written 10/04/2012  2:59 PM by Jeoffrey Massed, MD     Improving significantly on mysoline 100 mg bid. Increase to 150 mg bid.    AV NODAL REENTRY TACHYCARDIA   Last Assessment & Plan   12/22/2011 Office Visit Written 12/22/2011 10:25 AM by Prescott Parma, PA     Continues to do well on current medication regimen, with decreased episodes of breakthrough palpitations following up titration of Toprol dose, at time of last OV. Reassess clinical status in 6 months, at which time she will establish with Dr. Diona Browner, here in our [place] clinic.    Seasonal and perennial allergic rhinitis   Last Assessment & Plan   11/10/2012 Office Visit Written 11/23/2012 12:45 PM by Waymon Budge, MD     Inadequate control with Nasonex, occasional antihistamine, saline rinse. Plan-trial of Allegra, sample of Dymista    ESOPHAGEAL REFLUX   Last Assessment & Plan   03/10/2012 Office Visit Written 03/20/2012  4:01 PM by Waymon Budge, MD     Reviewed reflux precautions    PALPITATIONS   Hypothyroidism   Last Assessment & Plan   08/11/2012 Office Visit Written 08/20/2012  6:22 PM by Jeoffrey Massed, MD     Obtain old records for recent lab info.    Moderate intermittent asthma   Last Assessment & Plan   11/10/2012 Office Visit Written 11/23/2012  12:47 PM by Waymon Budge, MD     She is associating wheezing and chest tightness with her heartburn symptoms. Plan-follow directions from GI but can also try a liquid antacid    Esophageal stenosis   Unspecified gastritis and gastroduodenitis without mention of hemorrhage   GERD (gastroesophageal reflux disease)   Last Assessment & Plan   11/10/2012 Office Visit Written 11/23/2012 12:47 PM by Waymon Budge, MD     Active insignificant by symptom report. I don't have the report from recent endoscopy    IBS (irritable bowel syndrome)   Unspecified constipation   Last Assessment & Plan   11/03/2012 Office Visit  Written 11/03/2012  4:56 PM by Nira Retort, NP     With history of IBS, no concerning features. Trial Linzess 145 mcg daily. Next colonoscopy due in Oct 2015.     Plantar fasciitis, left   Last Assessment & Plan   12/22/2012 Office Visit Edited 12/22/2012 11:01 AM by Judi Saa, DO     had injection back on December 14, 2012 by Dr. Romeo Apple Plantar Fascitis: We reviewed that stretching is critically important to the treatment of PF. Reviewed footwear. Rigid soles have been shown to help with PF. Night splints can help. Reviewed rehab of stretching and calf raises.  Could benefit from a corticosteroid injection, orthotics, or other measures if conservative treatment fails. Due to tear in patient's ambulation patient was put in a Cam Walker for the next 2-4 weeks. Patient will come back again at that time and will transition more to a shoe. We'll discuss over-the-counter orthotics and possibly formal physical therapy at that time. She'll follow up in 4 weeks        Imaging: Dg Chest 2 View (if Patient Has Fever And/or Copd)  12/04/2012   *RADIOLOGY REPORT*  Clinical Data:  Shortness of breath, wheezing, cough and history of asthma.  CHEST - 2 VIEW  Comparison: 08/24/2012  Findings: The heart size and mediastinal contours are within normal limits.  Both lungs are clear.  The visualized skeletal structures are unremarkable.  IMPRESSION: No active disease.   Original Report Authenticated By: Irish Lack, M.D.

## 2012-12-23 NOTE — Telephone Encounter (Signed)
Please tell her Ibuprofen would be fine.  Could add tylenol as well.

## 2012-12-23 NOTE — Progress Notes (Signed)
HPI: [name] is a 62 year old patient formerly seen by Dr. Andee Lineman, with followup per Dr. [place] Lions, in the [place] office. 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 She was last seen by Dr. Diona Browner on 10/26/2012. Chest complaints of chest pain at that time which he believed was likely GI.      She has a history of hypertension, diabetes on insulin, anxiety, and depression. The patient was recently seen in the emergency room on 12/16/2012 with dizziness, diarrhea and altered mental status. She was found  to be negative for UTI, infection, but was found to be hyponatremic with a sodium of 127. She was given a bolus of normal saline and discharged home with followup with primary care physician.   She comes today without significant symptoms. She has some chest wall pain associated with bronchitis and coughing.   Allergies  Allergen Reactions  . Guaifenesin     MUCINEX REACTION: asthma attacks  . Oxycodone-Acetaminophen     TYLOX.  . Guaifenesin Er     Current Outpatient Prescriptions  Medication Sig Dispense Refill  . albuterol (PROAIR HFA) 108 (90 BASE) MCG/ACT inhaler Inhale 2 puffs into the lungs every 6 (six) hours as needed for wheezing.      Marland Kitchen albuterol (PROVENTIL) (2.5 MG/3ML) 0.083% nebulizer solution Take 2.5 mg by nebulization every 6 (six) hours as needed for wheezing or shortness of breath.      Marland Kitchen aspirin EC 81 MG tablet Take 81 mg by mouth every morning.       . cyclobenzaprine (FLEXERIL) 10 MG tablet Take 5 mg by mouth 3 (three) times daily as needed for muscle spasms.       Marland Kitchen dexlansoprazole (DEXILANT) 60 MG capsule Take 60 mg by mouth daily.      . diphenhydrAMINE (SOMINEX) 25 MG tablet Take 25-50 mg by mouth at bedtime as needed for allergies or sleep.      . DULoxetine (CYMBALTA) 60 MG capsule Take 1 capsule (60 mg total) by mouth daily.  30 capsule  2  . HYDROcodone-acetaminophen (NORCO) 5-325 MG per tablet Take 1 tablet by  mouth every 4 (four) hours as needed for pain.  15 tablet  0  . Insulin Detemir (LEVEMIR FLEXTOUCH) 100 UNIT/ML SOPN Inject 50 Units into the skin at bedtime.  10 pen  11  . Insulin Lispro, Human, (HUMALOG KWIKPEN [place]) 3 times a day (just before each meal) 22-15-22 units.      Marland Kitchen levothyroxine (SYNTHROID, LEVOTHROID) 75 MCG tablet Take 1 tablet (75 mcg total) by mouth daily before breakfast.  30 tablet  0  . LORazepam (ATIVAN) 1 MG tablet Take 1 tablet (1 mg total) by mouth 3 (three) times daily.  90 tablet  2  . metoprolol succinate (TOPROL-XL) 50 MG 24 hr tablet Take 50 mg by mouth 2 (two) times daily. Take with or immediately following a meal.      . mometasone (NASONEX) 50 MCG/ACT nasal spray Place 2 sprays into the nose every morning.       . mometasone-formoterol (DULERA) 100-5 MCG/ACT AERO Inhale 1 puff into the lungs 2 (two) times daily.      Marland Kitchen olmesartan-hydrochlorothiazide (BENICAR HCT) 40-25 MG per tablet Take 1 tablet by mouth daily.  30 tablet  1  . primidone (MYSOLINE) 50 MG tablet Take 150 mg by mouth 2 (two) times daily.      . promethazine-codeine (PHENERGAN WITH CODEINE) 6.25-10 MG/5ML syrup Take  5 mLs by mouth 2 (two) times daily as needed for cough.  120 mL  0  . Pseudoeph-Doxylamine-DM-APAP (NYQUIL PO) Take 5 mLs by mouth at bedtime as needed (sinus drainage).      . sertraline (ZOLOFT) 100 MG tablet Take 100 mg by mouth at bedtime.        No current facility-administered medications for this visit.    Past Medical History  Diagnosis Date  . Hyperlipidemia   . Hypertension   . Diabetes mellitus   . Asthma   . 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 Hillis Range, residual palpitations  . Vocal cord dysfunction   . Esophageal reflux   . Tremor, essential     Neurontin 600 mg qhs x 1 yr--no help.  Inderal prior to this was helpful for 10 yrs then stopped working.  . Anxiety   . Allergic rhinitis   . Low sodium syndrome   . Depression      Past Surgical History  Procedure Laterality Date  . Tonsillectomy    . Right breast cyst      benign  . Left ankle ligament repair    . Left elbow repair    . Partial hysterectomy    . Cholecystectomy    . Cesarean section    . Ablation for avnrt    . Colonoscopy  01/16/2004    WUJ:[id number] rectum/colon  . Esophagogastroduodenoscopy (egd) with esophageal dilation  04/03/2002    NWG:[id number]'M ring, otherwise normal esophagus, status post dilation with 54 F/Normal stomach  . Bartholin gland cyst excision    . Esophagogastroduodenoscopy (egd) with esophageal dilation N/A 11/08/2012    Procedure: ESOPHAGOGASTRODUODENOSCOPY (EGD) WITH ESOPHAGEAL DILATION;  Surgeon: Corbin Ade, MD;  Location: [facility];  Service: Endoscopy;  Laterality: N/A;  11:30  . Abdominal hysterectomy      ROS: Review of systems complete and found to be negative unless listed above  PHYSICAL EXAM BP 105/71  Pulse 93  Ht 5' 4.5" (1.638 m)  Wt 186 lb (84.369 kg)  BMI 31.45 kg/m2  General: Well developed, well nourished, in no acute distress Head: Eyes PERRLA, No xanthomas.   Normal cephalic and atramatic  Lungs: Clear bilaterally to auscultation and percussion. Heart: HRRR S1 S2, without MRG.  Pulses are 2+ & equal.            No carotid bruit. No JVD.  No abdominal bruits. No femoral bruits. Abdomen: Bowel sounds are positive, abdomen soft and non-tender without masses or                  Hernia's noted. Msk:  Back normal, normal gait. Normal strength and tone for age. Extremities: No clubbing, cyanosis or edema.  DP +1 Neuro: Alert and oriented X 3.Essential tremor is noted. Psych:  Good affect, responds appropriately    ASSESSMENT AND PLAN

## 2012-12-23 NOTE — Telephone Encounter (Signed)
Please advise 

## 2012-12-23 NOTE — Assessment & Plan Note (Signed)
No complaints about her heart racing or palpitations. Will continue BB as directed.

## 2012-12-23 NOTE — Addendum Note (Signed)
Addended by: Derry Lory A on: 12/23/2012 03:30 PM   Modules accepted: Orders

## 2012-12-23 NOTE — Assessment & Plan Note (Signed)
She is hypotensive today. She is not found to be orthostatic per BP's completed in the office. I will discontinue the HCTZ portion of Benicar,, and have follow up BMET completed next week.  She will otherwise continue current medications. She will have BP check again in one week for ongoing assessment.

## 2012-12-24 NOTE — Assessment & Plan Note (Signed)
Plan-Diflucan 

## 2012-12-24 NOTE — Assessment & Plan Note (Signed)
There is an upper airway component now probably from thrush. Watching to see if psychiatrist helps her and reduces frequency of ER visits for respiratory complaints. Plan-Diflucan

## 2012-12-26 ENCOUNTER — Other Ambulatory Visit: Payer: Self-pay | Admitting: *Deleted

## 2012-12-26 MED ORDER — OLMESARTAN MEDOXOMIL 40 MG PO TABS: 40.0000 mg | ORAL_TABLET | Freq: Every day | ORAL | Status: DC

## 2012-12-27 ENCOUNTER — Telehealth: Payer: Self-pay | Admitting: Cardiology

## 2012-12-27 LAB — BASIC METABOLIC PANEL
BUN: 12 mg/dL (ref 6–23)
CO2: 29 mEq/L (ref 19–32)
Calcium: 9.1 mg/dL (ref 8.4–10.5)
Chloride: 95 mEq/L — ABNORMAL LOW (ref 96–112)
Creat: 0.7 mg/dL (ref 0.50–1.10)
Glucose, Bld: 509 mg/dL (ref 70–99)
Potassium: 4.7 mEq/L (ref 3.5–5.3)
Sodium: 131 mEq/L — ABNORMAL LOW (ref 135–145)

## 2012-12-27 NOTE — Telephone Encounter (Signed)
Lab reported critical value, BG at 509 earlier today at 10 am. I called patient home, no one picked up, but left message:  Instructed pt and family to re-check blood glucose, if still elevated, take diabetes medication, if remains elevated, to go ER for evaluation. If no measures to check BG or have symptoms of ABG pain, dizziness, altered mental status and etc, patient is to come to ER ASAP.    Haydee Salter, MD Cardiology Fellow

## 2012-12-29 ENCOUNTER — Ambulatory Visit: Payer: BC Managed Care – PPO | Admitting: Cardiovascular Disease

## 2012-12-29 ENCOUNTER — Telehealth: Payer: Self-pay | Admitting: *Deleted

## 2012-12-29 ENCOUNTER — Ambulatory Visit (INDEPENDENT_AMBULATORY_CARE_PROVIDER_SITE_OTHER): Payer: BC Managed Care – PPO | Admitting: Endocrinology

## 2012-12-29 ENCOUNTER — Telehealth: Payer: Self-pay | Admitting: Internal Medicine

## 2012-12-29 ENCOUNTER — Telehealth: Payer: Self-pay | Admitting: Cardiovascular Disease

## 2012-12-29 ENCOUNTER — Encounter: Payer: Self-pay | Admitting: Endocrinology

## 2012-12-29 VITALS — BP 136/80 | HR 90

## 2012-12-29 DIAGNOSIS — IMO0001 Reserved for inherently not codable concepts without codable children: Secondary | ICD-10-CM

## 2012-12-29 MED ORDER — CEFUROXIME AXETIL 500 MG PO TABS: 500.0000 mg | ORAL_TABLET | Freq: Two times a day (BID) | ORAL | Status: DC

## 2012-12-29 MED ORDER — PREDNISONE 10 MG PO TABS: ORAL_TABLET | ORAL | Status: DC

## 2012-12-29 NOTE — Telephone Encounter (Signed)
ATC, NA and no voicemail. Carron Curie, CMA'

## 2012-12-29 NOTE — Telephone Encounter (Signed)
Spoke with Patient-- Pt c/o increased SOB, wheezing, congestion with dark yellow mucus, chest tightness/pain from cough x 1.5 weeks.  [facility] [phone]  Allergies  Allergen Reactions  . Guaifenesin     MUCINEX REACTION: asthma attacks  . Oxycodone-Acetaminophen     TYLOX.  . Guaifenesin Er     Please advise Dr Sherene Sires since CY is out of office today. Thanks.

## 2012-12-29 NOTE — Telephone Encounter (Signed)
Pt aware of recs. RX's have been called in

## 2012-12-29 NOTE — Telephone Encounter (Signed)
Patient had BP checked at Dr.Ellison's office this AM.  States that it was 130/90 approx. Please return call. / tgs

## 2012-12-29 NOTE — Telephone Encounter (Signed)
Can call in prescription for following:  1) cefuroxime 500 mg bid for 7 days, dispense 14 with no refills.  2) Prednisone 10 mg pills >> 2 pills for 2 days, 1 pill for 2 days, 1/2 pill for 2 days.  Dispense 7 with no refills.  She should call back and schedule ROV if not improved.

## 2012-12-29 NOTE — Telephone Encounter (Signed)
Pt called states she is still experiencing 8/10 pain while walking.  States she is wearing the boot.  Further states the Ibuprofen is not effective.  Please advise

## 2012-12-29 NOTE — Telephone Encounter (Signed)
Received BP recording of 130/90. This nurse verbally advised NP of the reading. She states that we can cancel the BP appointment. No changes at this time per NP.

## 2012-12-29 NOTE — Progress Notes (Signed)
Subjective:    Patient ID: [name], female    DOB: June 26, 1950, 62 y.o.   MRN: [id number]  HPI pt returns for f/u of insulin-requiring DM (dx'ed 1994; she has mild if any neuropathy of the lower extremities; she is unaware of any associated chronic complications.  she has been on insulin since 2012).  She has not been on any more steroids since last ov.  no cbg record, but states cbg's vary from 200-400.  It is in general higher as the day goes on.   Past Medical History  Diagnosis Date  . Hyperlipidemia   . Hypertension   . Diabetes mellitus   . Asthma   . 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 Hillis Range, residual palpitations  . Vocal cord dysfunction   . Esophageal reflux   . Tremor, essential     Neurontin 600 mg qhs x 1 yr--no help.  Inderal prior to this was helpful for 10 yrs then stopped working.  . Anxiety   . Allergic rhinitis   . Low sodium syndrome   . Depression     Past Surgical History  Procedure Laterality Date  . Tonsillectomy    . Right breast cyst      benign  . Left ankle ligament repair    . Left elbow repair    . Partial hysterectomy    . Cholecystectomy    . Cesarean section    . Ablation for avnrt    . Colonoscopy  01/16/2004    JYN:[id number] rectum/colon  . Esophagogastroduodenoscopy (egd) with esophageal dilation  04/03/2002    [id number]:[id number] ring, otherwise normal esophagus, status post dilation with 54 F/Normal stomach  . Bartholin gland cyst excision    . Esophagogastroduodenoscopy (egd) with esophageal dilation N/A 11/08/2012    Procedure: ESOPHAGOGASTRODUODENOSCOPY (EGD) WITH ESOPHAGEAL DILATION;  Surgeon: Corbin Ade, MD;  Location: [facility];  Service: Endoscopy;  Laterality: N/A;  11:30  . Abdominal hysterectomy      History   Social History  . Marital Status: Married    Spouse Name: N/A    Number of Children: N/A  . Years of Education: N/A   Occupational History  . Not on file.    Social History Main Topics  . Smoking status: Former Smoker -- 0.30 packs/day for 10 years    Types: Cigarettes    Quit date: 04/06/1985  . Smokeless tobacco: Never Used  . Alcohol Use: No  . Drug Use: No  . Sexual Activity: No   Other Topics Concern  . Not on file   Social History Narrative   Married, 1 daughte, living.  Lives with husband in [place], [place].   1 child died brain tumor at age 36.   Two grandchildren.   Works at [employer], patient currently lost her job.   Retired 2011.   No tobacco.   Alcohol: none in 30 yrs.  Distant history of heavy use.   No drug use.    Current Outpatient Prescriptions on File Prior to Visit  Medication Sig Dispense Refill  . albuterol (PROAIR HFA) 108 (90 BASE) MCG/ACT inhaler Inhale 2 puffs into the lungs every 6 (six) hours as needed for wheezing.      Marland Kitchen albuterol (PROVENTIL) (2.5 MG/3ML) 0.083% nebulizer solution Take 2.5 mg by nebulization every 6 (six) hours as needed for wheezing or shortness of breath.      Marland Kitchen aspirin EC 81 MG tablet Take 81  mg by mouth every morning.       . cyclobenzaprine (FLEXERIL) 10 MG tablet Take 5 mg by mouth 3 (three) times daily as needed for muscle spasms.       Marland Kitchen dexlansoprazole (DEXILANT) 60 MG capsule Take 60 mg by mouth daily.      . diphenhydrAMINE (SOMINEX) 25 MG tablet Take 25-50 mg by mouth at bedtime as needed for allergies or sleep.      . DULoxetine (CYMBALTA) 60 MG capsule Take 1 capsule (60 mg total) by mouth daily.  30 capsule  2  . HYDROcodone-acetaminophen (NORCO) 5-325 MG per tablet Take 1 tablet by mouth every 4 (four) hours as needed for pain.  15 tablet  0  . Insulin Detemir (LEVEMIR FLEXTOUCH) 100 UNIT/ML SOPN Inject 50 Units into the skin at bedtime.  10 pen  11  . Insulin Lispro, Human, (HUMALOG KWIKPEN [place]) Inject 30 Units into the skin 3 (three) times daily with meals.       Marland Kitchen levothyroxine (SYNTHROID, LEVOTHROID) 75 MCG tablet Take 1 tablet (75 mcg total) by mouth daily  before breakfast.  30 tablet  0  . LORazepam (ATIVAN) 1 MG tablet Take 1 tablet (1 mg total) by mouth 3 (three) times daily.  90 tablet  2  . metoprolol succinate (TOPROL-XL) 50 MG 24 hr tablet Take 50 mg by mouth 2 (two) times daily. Take with or immediately following a meal.      . mometasone (NASONEX) 50 MCG/ACT nasal spray Place 2 sprays into the nose every morning.       . mometasone-formoterol (DULERA) 100-5 MCG/ACT AERO Inhale 1 puff into the lungs 2 (two) times daily.      Marland Kitchen olmesartan (BENICAR) 40 MG tablet Take 1 tablet (40 mg total) by mouth daily.  90 tablet  1  . olmesartan-hydrochlorothiazide (BENICAR HCT) 40-25 MG per tablet Take 1 tablet by mouth daily.  30 tablet  1  . primidone (MYSOLINE) 50 MG tablet Take 150 mg by mouth 2 (two) times daily.      . promethazine-codeine (PHENERGAN WITH CODEINE) 6.25-10 MG/5ML syrup Take 5 mLs by mouth 2 (two) times daily as needed for cough.  120 mL  0  . Pseudoeph-Doxylamine-DM-APAP (NYQUIL PO) Take 5 mLs by mouth at bedtime as needed (sinus drainage).      . sertraline (ZOLOFT) 100 MG tablet Take 100 mg by mouth at bedtime.        No current facility-administered medications on file prior to visit.    Allergies  Allergen Reactions  . Guaifenesin     MUCINEX REACTION: asthma attacks  . Oxycodone-Acetaminophen     TYLOX.  . Guaifenesin Er     Family History  Problem Relation Age of Onset  . Lung cancer Father     DIED AGE 29 LUNG CA  . Alcohol abuse Father   . Anxiety disorder Father   . Depression Father   . COPD Mother     DIED AGE 63,MRSA,COPD,PNEUMONIA  . Pneumonia Mother     DIED AGE 63,MRSA,COPD,PNEUMONIA  . Supraventricular tachycardia Mother   . Anxiety disorder Mother   . Depression Mother   . Alcohol abuse Mother   . COPD Brother     AGE 56  . Heart disease Sister     DIED AGE 69, SMOKER,?HEART  . Colon cancer Neg Hx    BP 136/80  Pulse 90  SpO2 97%  Review of Systems denies hypoglycemia.  She has lost  weight.  Objective:   Physical Exam VITAL SIGNS:  See vs page GENERAL: no distress     Assessment & Plan:  DM: she needs increased rx, but she may also need a simpler regimen.  However, this insulin regimen was chosen from multiple options, as it best matches her insulin to her changing requirements throughout the day.  The benefits of glycemic control must be weighed against the risks of hypoglycemia.   Depression: this complicates the rx of DM.

## 2012-12-29 NOTE — Patient Instructions (Addendum)
check your blood sugar 2 times a day.  vary the time of day when you check, between before the 3 meals, and at bedtime.  also check if you have symptoms of your blood sugar being too high or too low.  please keep a record of the readings and bring it to your next appointment here.  please call us sooner if your blood sugar goes below 70, or if you have a lot of readings over 200. Please continue the same levemir.   Please increase the humalog to 30 units 3 times a day (just before each meal).   Please come back for a follow-up appointment in 2 weeks.

## 2012-12-30 ENCOUNTER — Telehealth: Payer: Self-pay | Admitting: Internal Medicine

## 2012-12-30 ENCOUNTER — Telehealth: Payer: Self-pay | Admitting: *Deleted

## 2012-12-30 NOTE — Telephone Encounter (Signed)
Spoke to pt in hallway. NP gave pt 1 month samples of Benicar 40 mg till prior authorization is approved. Will call pt when we hear back about prior authorization.

## 2012-12-30 NOTE — Telephone Encounter (Signed)
[id number]. Spoke with [name] and she states she sent the forms off 1 week ago and that it takes 2 weeks. She states that the pt kept the [employer] with the number on it to contact the company. Carron Curie, CMA

## 2012-12-30 NOTE — Telephone Encounter (Signed)
Pt stopped by today to ask again about her benicar Rx refill that needs preauthorization has been done yet? Pt is very concerned and has talk with a nurse just about everyday because she doesn't want to end up in hospital because her potassium is low. Please call the patient back ASAP.

## 2013-01-01 NOTE — Telephone Encounter (Signed)
Please call and tell her that is unfortunate, probably should be seen and I can change medications. Please advise patient to make an appointment this week.

## 2013-01-02 ENCOUNTER — Telehealth: Payer: Self-pay | Admitting: Internal Medicine

## 2013-01-02 MED ORDER — NAPROXEN 500 MG PO TABS: 500.0000 mg | ORAL_TABLET | Freq: Two times a day (BID) | ORAL | Status: DC

## 2013-01-02 NOTE — Telephone Encounter (Signed)
ATC patient, no answer [id number] 

## 2013-01-02 NOTE — Telephone Encounter (Signed)
lmomtcb x1 

## 2013-01-02 NOTE — Telephone Encounter (Signed)
Please call her back.  Sent in naproxen due to hx of heart and DM.  Please tell her as well make sure she is doing the exercises and ice baths.  If not better in a week she can come in earlier.

## 2013-01-02 NOTE — Telephone Encounter (Signed)
Returning call.[name] ° °

## 2013-01-02 NOTE — Telephone Encounter (Signed)
Spoke with pt advised of MDs message 

## 2013-01-02 NOTE — Telephone Encounter (Signed)
Pt called stated that her heel is still hurting and pt was wondering if Dr. Katrinka Blazing can give her something to help with this pain. Please advise. Pt saw Dr. Katrinka Blazing about 2 week ago.

## 2013-01-02 NOTE — Telephone Encounter (Signed)
Spoke with patient, advised of below Patient verbalized understanding and nothing further needed at this time

## 2013-01-02 NOTE — Telephone Encounter (Signed)
Please advise 

## 2013-01-09 ENCOUNTER — Encounter (HOSPITAL_COMMUNITY): Payer: Self-pay | Admitting: Psychiatry

## 2013-01-09 ENCOUNTER — Ambulatory Visit (INDEPENDENT_AMBULATORY_CARE_PROVIDER_SITE_OTHER): Payer: BC Managed Care – PPO | Admitting: Psychiatry

## 2013-01-09 VITALS — BP 140/76 | Ht 64.0 in | Wt 196.0 lb

## 2013-01-09 DIAGNOSIS — F4310 Post-traumatic stress disorder, unspecified: Secondary | ICD-10-CM

## 2013-01-09 DIAGNOSIS — F411 Generalized anxiety disorder: Secondary | ICD-10-CM
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
## 2013-01-10 ENCOUNTER — Other Ambulatory Visit: Payer: Self-pay

## 2013-01-10 ENCOUNTER — Other Ambulatory Visit: Payer: BC Managed Care – PPO

## 2013-01-10 ENCOUNTER — Encounter: Payer: Self-pay | Admitting: Internal Medicine

## 2013-01-10 ENCOUNTER — Ambulatory Visit (INDEPENDENT_AMBULATORY_CARE_PROVIDER_SITE_OTHER): Payer: BC Managed Care – PPO | Admitting: Internal Medicine

## 2013-01-10 VITALS — BP 148/80 | HR 98 | Ht 64.5 in | Wt 192.6 lb

## 2013-01-10 DIAGNOSIS — J4520 Mild intermittent asthma, uncomplicated: Secondary | ICD-10-CM

## 2013-01-10 DIAGNOSIS — B370 Candidal stomatitis: Secondary | ICD-10-CM

## 2013-01-10 DIAGNOSIS — J309 Allergic rhinitis, unspecified: Secondary | ICD-10-CM

## 2013-01-10 DIAGNOSIS — J45909 Unspecified asthma, uncomplicated: Secondary | ICD-10-CM

## 2013-01-10 DIAGNOSIS — J302 Other seasonal allergic rhinitis: Secondary | ICD-10-CM

## 2013-01-10 DIAGNOSIS — IMO0001 Reserved for inherently not codable concepts without codable children: Secondary | ICD-10-CM

## 2013-01-10 MED ORDER — FLUTICASONE-SALMETEROL 115-21 MCG/ACT IN AERO: INHALATION_SPRAY | RESPIRATORY_TRACT | Status: DC

## 2013-01-10 MED ORDER — INSULIN LISPRO 100 UNIT/ML ~~LOC~~ SOLN: 30.0000 [IU] | Freq: Three times a day (TID) | SUBCUTANEOUS | Status: DC

## 2013-01-10 MED ORDER — AEROCHAMBER MV MISC: Status: DC
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
## 2013-01-10 NOTE — Patient Instructions (Addendum)
Sample x 2 Advair HFA 115/21    2 puffs  Through spacer tube,  then rinse mouth well, twice daily every day  Order script aerochamber  Order- lab- Allergy profile  Dx allergic asthma

## 2013-01-11 ENCOUNTER — Telehealth: Payer: Self-pay | Admitting: Internal Medicine

## 2013-01-11 LAB — ALLERGY FULL PROFILE
Allergen, D pternoyssinus,d7: 1.48 kU/L — ABNORMAL HIGH
Allergen,Goose feathers, e70: <0.1 kU/L
Alternaria Alternata: <0.1 kU/L
Aspergillus fumigatus, m3: <0.1 kU/L
Bahia Grass: 0.72 kU/L — ABNORMAL HIGH
Bermuda Grass: 0.65 kU/L — ABNORMAL HIGH
Box Elder IgE: 0.46 kU/L — ABNORMAL HIGH
Candida Albicans: <0.1 kU/L
Cat Dander: <0.1 kU/L
Common Ragweed: 0.48 kU/L — ABNORMAL HIGH
Curvularia lunata: <0.1 kU/L
D. farinae: 2.5 kU/L — ABNORMAL HIGH
Dog Dander: 0.29 kU/L — ABNORMAL HIGH
Elm IgE: 1.22 kU/L — ABNORMAL HIGH
Fescue: 0.52 kU/L — ABNORMAL HIGH
G005 Rye, Perennial: 0.57 kU/L — ABNORMAL HIGH
G009 Red Top: 0.52 kU/L — ABNORMAL HIGH
Goldenrod: 0.21 kU/L — ABNORMAL HIGH
Helminthosporium halodes: <0.1 kU/L
House Dust Hollister: 0.24 kU/L — ABNORMAL HIGH
IgE (Immunoglobulin E), Serum: 166 IU/mL (ref 0.0–180.0)
Lamb's Quarters: 0.43 kU/L — ABNORMAL HIGH
Oak: 0.61 kU/L — ABNORMAL HIGH
Plantain: 0.38 kU/L — ABNORMAL HIGH
Stemphylium Botryosum: <0.1 kU/L
Sycamore Tree: 0.58 kU/L — ABNORMAL HIGH
Timothy Grass: 0.64 kU/L — ABNORMAL HIGH

## 2013-01-11 NOTE — Telephone Encounter (Signed)
Notes Recorded by Waymon Budge, MD on 01/11/2013 at 1:03 PM Allergy antibodies are broadly elevated for common inhaled allergens. This may point to some additional treatment options we can discus at next ov

## 2013-01-12 ENCOUNTER — Encounter: Payer: Self-pay | Admitting: Internal Medicine

## 2013-01-12 ENCOUNTER — Ambulatory Visit (INDEPENDENT_AMBULATORY_CARE_PROVIDER_SITE_OTHER): Payer: BC Managed Care – PPO | Admitting: Internal Medicine

## 2013-01-12 ENCOUNTER — Ambulatory Visit: Payer: Self-pay | Admitting: Endocrinology

## 2013-01-12 VITALS — BP 120/60 | HR 86 | Ht 64.5 in | Wt 192.0 lb

## 2013-01-12 DIAGNOSIS — J302 Other seasonal allergic rhinitis: Secondary | ICD-10-CM

## 2013-01-12 DIAGNOSIS — J4520 Mild intermittent asthma, uncomplicated: Secondary | ICD-10-CM

## 2013-01-12 DIAGNOSIS — J309 Allergic rhinitis, unspecified: Secondary | ICD-10-CM

## 2013-01-12 DIAGNOSIS — J45909 Unspecified asthma, uncomplicated: Secondary | ICD-10-CM

## 2013-01-12 DIAGNOSIS — IMO0001 Reserved for inherently not codable concepts without codable children: Secondary | ICD-10-CM

## 2013-01-12 NOTE — Telephone Encounter (Signed)
lmomtcb for pt 

## 2013-01-12 NOTE — Patient Instructions (Signed)
The allergy blood test  Suggests that we might want to look at trying Xolair shots in the future, as discussed.  Please call as needed
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
## 2013-01-13 ENCOUNTER — Other Ambulatory Visit: Payer: Self-pay | Admitting: Endocrinology

## 2013-01-13 NOTE — Telephone Encounter (Signed)
Pt called back and she is aware of lab results from her OV with CY.  Nothing further is needed.

## 2013-01-13 NOTE — Telephone Encounter (Signed)
lmomtcb  

## 2013-01-17 ENCOUNTER — Ambulatory Visit: Payer: Self-pay | Admitting: Endocrinology

## 2013-01-18 ENCOUNTER — Encounter: Payer: Self-pay | Admitting: Endocrinology

## 2013-01-18 ENCOUNTER — Ambulatory Visit (INDEPENDENT_AMBULATORY_CARE_PROVIDER_SITE_OTHER): Payer: BC Managed Care – PPO | Admitting: Internal Medicine

## 2013-01-18 ENCOUNTER — Encounter: Payer: Self-pay | Admitting: Internal Medicine

## 2013-01-18 ENCOUNTER — Ambulatory Visit (INDEPENDENT_AMBULATORY_CARE_PROVIDER_SITE_OTHER): Payer: BC Managed Care – PPO | Admitting: Endocrinology

## 2013-01-18 ENCOUNTER — Telehealth: Payer: Self-pay | Admitting: Internal Medicine

## 2013-01-18 ENCOUNTER — Other Ambulatory Visit (INDEPENDENT_AMBULATORY_CARE_PROVIDER_SITE_OTHER): Payer: BC Managed Care – PPO

## 2013-01-18 VITALS — BP 127/82 | HR 85 | Temp 97.6°F | Resp 16 | Ht 64.5 in | Wt 191.8 lb

## 2013-01-18 VITALS — BP 134/80 | HR 90

## 2013-01-18 DIAGNOSIS — D510 Vitamin B12 deficiency anemia due to intrinsic factor deficiency: Secondary | ICD-10-CM

## 2013-01-18 DIAGNOSIS — E039 Hypothyroidism, unspecified: Secondary | ICD-10-CM

## 2013-01-18 DIAGNOSIS — IMO0001 Reserved for inherently not codable concepts without codable children: Secondary | ICD-10-CM

## 2013-01-18 DIAGNOSIS — I10 Essential (primary) hypertension: Secondary | ICD-10-CM

## 2013-01-18 DIAGNOSIS — R7401 Elevation of levels of liver transaminase levels: Secondary | ICD-10-CM | POA: Insufficient documentation

## 2013-01-18 DIAGNOSIS — E785 Hyperlipidemia, unspecified: Secondary | ICD-10-CM

## 2013-01-18 DIAGNOSIS — Z1231 Encounter for screening mammogram for malignant neoplasm of breast: Secondary | ICD-10-CM | POA: Insufficient documentation

## 2013-01-18 LAB — URINALYSIS, ROUTINE W REFLEX MICROSCOPIC
Bilirubin Urine: NEGATIVE
Hgb urine dipstick: NEGATIVE
Ketones, ur: NEGATIVE
Nitrite: NEGATIVE
Specific Gravity, Urine: 1.015 (ref 1.000–1.030)
Total Protein, Urine: NEGATIVE
Urine Glucose: NEGATIVE
Urobilinogen, UA: 0.2 (ref 0.0–1.0)
pH: 7 (ref 5.0–8.0)

## 2013-01-18 LAB — CBC WITH DIFFERENTIAL/PLATELET
Basophils Absolute: 0 10*3/uL (ref 0.0–0.1)
Basophils Relative: 0.3 % (ref 0.0–3.0)
Eosinophils Absolute: 0.4 10*3/uL (ref 0.0–0.7)
Eosinophils Relative: 5.4 % — ABNORMAL HIGH (ref 0.0–5.0)
HCT: 34.3 % — ABNORMAL LOW (ref 36.0–46.0)
Hemoglobin: 11.6 g/dL — ABNORMAL LOW (ref 12.0–15.0)
Lymphocytes Relative: 33.4 % (ref 12.0–46.0)
Lymphs Abs: 2.5 10*3/uL (ref 0.7–4.0)
MCHC: 34 g/dL (ref 30.0–36.0)
MCV: 88.5 fl (ref 78.0–100.0)
Monocytes Absolute: 0.4 10*3/uL (ref 0.1–1.0)
Monocytes Relative: 5 % (ref 3.0–12.0)
Neutro Abs: 4.1 10*3/uL (ref 1.4–7.7)
Neutrophils Relative %: 55.9 % (ref 43.0–77.0)
Platelets: 213 10*3/uL (ref 150.0–400.0)
RBC: 3.87 Mil/uL (ref 3.87–5.11)
RDW: 14 % (ref 11.5–14.6)
WBC: 7.4 10*3/uL (ref 4.5–10.5)

## 2013-01-18 LAB — COMPREHENSIVE METABOLIC PANEL
ALT: 130 U/L — ABNORMAL HIGH (ref 0–35)
AST: 52 U/L — ABNORMAL HIGH (ref 0–37)
Albumin: 3.7 g/dL (ref 3.5–5.2)
Alkaline Phosphatase: 136 U/L — ABNORMAL HIGH (ref 39–117)
BUN: 7 mg/dL (ref 6–23)
CO2: 26 mEq/L (ref 19–32)
Calcium: 8.8 mg/dL (ref 8.4–10.5)
Chloride: 103 mEq/L (ref 96–112)
Creatinine, Ser: 0.7 mg/dL (ref 0.4–1.2)
GFR: 89.48 mL/min (ref >60.00)
Glucose, Bld: 177 mg/dL — ABNORMAL HIGH (ref 70–99)
Potassium: 3.9 mEq/L (ref 3.5–5.1)
Sodium: 137 mEq/L (ref 135–145)
Total Bilirubin: 0.4 mg/dL (ref 0.3–1.2)
Total Protein: 6.7 g/dL (ref 6.0–8.3)

## 2013-01-18 LAB — FERRITIN: Ferritin: 18.7 ng/mL (ref 10.0–291.0)

## 2013-01-18 LAB — HM COLONOSCOPY: HM Colonoscopy: NORMAL

## 2013-01-18 LAB — TSH: TSH: 1.69 u[IU]/mL (ref 0.35–5.50)

## 2013-01-18 LAB — LIPID PANEL
Cholesterol: 187 mg/dL (ref 0–200)
HDL: 79.7 mg/dL (ref >39.00)
LDL Cholesterol: 88 mg/dL (ref 0–99)
Total CHOL/HDL Ratio: 2
Triglycerides: 96 mg/dL (ref 0.0–149.0)
VLDL: 19.2 mg/dL (ref 0.0–40.0)

## 2013-01-18 LAB — IBC PANEL
Iron: 34 ug/dL — ABNORMAL LOW (ref 42–145)
Saturation Ratios: 10.1 % — ABNORMAL LOW (ref 20.0–50.0)
Transferrin: 239.9 mg/dL (ref 212.0–360.0)

## 2013-01-18 LAB — VITAMIN B12: Vitamin B-12: 383 pg/mL (ref 211–911)

## 2013-01-18 LAB — HEMOGLOBIN A1C: Hgb A1c MFr Bld: 9.5 % — ABNORMAL HIGH (ref 4.6–6.5)

## 2013-01-18 LAB — FOLATE: Folate: >24.8 ng/mL (ref >5.9)

## 2013-01-18 MED ORDER — SITAGLIPTIN PHOSPHATE 100 MG PO TABS: 100.0000 mg | ORAL_TABLET | Freq: Every day | ORAL | Status: DC

## 2013-01-18 MED ORDER — DAPAGLIFLOZIN PROPANEDIOL 10 MG PO TABS: 1.0000 | ORAL_TABLET | Freq: Every day | ORAL | Status: DC

## 2013-01-18 NOTE — Progress Notes (Signed)
Subjective:    Patient ID: [name], female    DOB: 1950-09-26, 62 y.o.   MRN: [id number]  Diabetes She presents for her follow-up diabetic visit. She has type 2 diabetes mellitus. The initial diagnosis of diabetes was made 5 years ago. Her disease course has been stable. Hypoglycemia symptoms include tremors. Pertinent negatives for hypoglycemia include no dizziness, headaches, seizures or speech difficulty. Associated symptoms include polydipsia, polyphagia and polyuria. Pertinent negatives for diabetes include no blurred vision, no chest pain, no fatigue, no foot paresthesias, no foot ulcerations, no visual change, no weakness and no weight loss. There are no hypoglycemic complications. Symptoms are worsening. Current diabetic treatment includes intensive insulin program and insulin injections. She is compliant with treatment all of the time. Her weight is stable. She is following a generally unhealthy diet. When asked about meal planning, she reported none. She has not had a previous visit with a dietician. She participates in exercise intermittently. Her home blood glucose trend is increasing rapidly. An ACE inhibitor/angiotensin II receptor blocker is being taken. She does not see a podiatrist.Eye exam is not current.      Review of Systems  Constitutional: Negative.  Negative for weight loss, diaphoresis, appetite change and fatigue.  HENT: Negative.   Eyes: Negative.  Negative for blurred vision.  Respiratory: Negative.  Negative for shortness of breath and stridor.   Cardiovascular: Negative.  Negative for chest pain, palpitations and leg swelling.  Gastrointestinal: Negative.  Negative for constipation.  Endocrine: Positive for polydipsia, polyphagia and polyuria.  Genitourinary: Negative.   Musculoskeletal: Negative.  Negative for back pain, gait problem, joint swelling, neck pain and neck stiffness.  Skin: Negative.   Allergic/Immunologic: Negative.  Negative for  environmental allergies, food allergies and immunocompromised state.  Neurological: Positive for tremors. Negative for dizziness, seizures, syncope, speech difficulty, weakness, light-headedness, numbness and headaches.  Hematological: Negative.  Negative for adenopathy. Does not bruise/bleed easily.  Psychiatric/Behavioral: Negative.        Objective:   Physical Exam  Vitals reviewed. Constitutional: She is oriented to person, place, and time. She appears well-developed and well-nourished. No distress.  HENT:  Head: Normocephalic and atraumatic.  Mouth/Throat: No oropharyngeal exudate.  Eyes: Conjunctivae are normal. Right eye exhibits no discharge. Left eye exhibits no discharge. No scleral icterus.  Neck: Normal range of motion. Neck supple. No JVD present. No tracheal deviation present. No thyromegaly present.  Cardiovascular: Normal rate, regular rhythm, normal heart sounds and intact distal pulses.  Exam reveals no gallop and no friction rub.   No murmur heard. Pulmonary/Chest: Effort normal and breath sounds normal. No stridor. No respiratory distress. She has no wheezes. She has no rales. She exhibits no tenderness.  Abdominal: Soft. Bowel sounds are normal. She exhibits no distension and no mass. There is no tenderness. There is no rebound and no guarding.  Musculoskeletal: Normal range of motion. She exhibits edema (trace edema in BLE). She exhibits no tenderness.  Lymphadenopathy:    She has no cervical adenopathy.  Neurological: She is alert and oriented to person, place, and time. She has normal strength. She displays tremor. She displays no atrophy. No cranial nerve deficit or sensory deficit. She exhibits normal muscle tone. She displays a negative Romberg sign. She displays no seizure activity. Coordination and gait normal.  Skin: Skin is warm and dry. No rash noted. She is not diaphoretic. No erythema. No pallor.  Psychiatric: She has a normal mood and affect. Her behavior  is normal. Judgment and thought  content normal.     Lab Results  Component Value Date   WBC 8.4 12/17/2012   HGB 11.5* 12/17/2012   HCT 33.6* 12/17/2012   PLT 232 12/17/2012   GLUCOSE 509* 12/27/2012   ALT 16 12/17/2012   AST 15 12/17/2012   NA 131* 12/27/2012   K 4.7 12/27/2012   CL 95* 12/27/2012   CREATININE 0.70 12/27/2012   BUN 12 12/27/2012   CO2 29 12/27/2012   TSH 0.92 09/01/2012   INR 1.0 02/27/2008   HGBA1C 10.8* 09/01/2012       Assessment & Plan:

## 2013-01-18 NOTE — Telephone Encounter (Signed)
Late entry: Called by Caller nurse last night at 9 pm as pt had a low CBG (53) at 5 pm after waking up from a nap. She drank milk >> CBG 112. She did not take dinnertime Humalog (she is on Humalog 30 tid) but did eat dinner >> CBG 345, and repeat 295 around 9 pm. Pt calling to see if she can take her Levemir 50 units. She has an appt with dr Everardo All in am today. Advised to: - take the Levemir hs dose - check sugar at bedtime to make sure not getting very high

## 2013-01-18 NOTE — Patient Instructions (Signed)
Type 2 Diabetes Mellitus, Adult Type 2 diabetes mellitus, often simply referred to as type 2 diabetes, is a long-lasting (chronic) disease. In type 2 diabetes, the pancreas does not make enough insulin (a hormone), the cells are less responsive to the insulin that is made (insulin resistance), or both. Normally, insulin moves sugars from food into the tissue cells. The tissue cells use the sugars for energy. The lack of insulin or the lack of normal response to insulin causes excess sugars to build up in the blood instead of going into the tissue cells. As a result, high blood sugar (hyperglycemia) develops. The effect of high sugar (glucose) levels can cause many complications. Type 2 diabetes was also previously called adult-onset diabetes but it can occur at any age.  RISK FACTORS  A person is predisposed to developing type 2 diabetes if someone in the family has the disease and also has one or more of the following primary risk factors:  Overweight.  An inactive lifestyle.  A history of consistently eating high-calorie foods. Maintaining a normal weight and regular physical activity can reduce the chance of developing type 2 diabetes. SYMPTOMS  A person with type 2 diabetes may not show symptoms initially. The symptoms of type 2 diabetes appear slowly. The symptoms include:  Increased thirst (polydipsia).  Increased urination (polyuria).  Increased urination during the night (nocturia).  Weight loss. This weight loss may be rapid.  Frequent, recurring infections.  Tiredness (fatigue).  Weakness.  Vision changes, such as blurred vision.  Fruity smell to your breath.  Abdominal pain.  Nausea or vomiting.  Cuts or bruises which are slow to heal.  Tingling or numbness in the hands or feet. DIAGNOSIS Type 2 diabetes is frequently not diagnosed until complications of diabetes are present. Type 2 diabetes is diagnosed when symptoms or complications are present and when blood  glucose levels are increased. Your blood glucose level may be checked by one or more of the following blood tests:  A fasting blood glucose test. You will not be allowed to eat for at least 8 hours before a blood sample is taken.  A random blood glucose test. Your blood glucose is checked at any time of the day regardless of when you ate.  A hemoglobin A1c blood glucose test. A hemoglobin A1c test provides information about blood glucose control over the previous 3 months.  An oral glucose tolerance test (OGTT). Your blood glucose is measured after you have not eaten (fasted) for 2 hours and then after you drink a glucose-containing beverage. TREATMENT   You may need to take insulin or diabetes medicine daily to keep blood glucose levels in the desired range.  You will need to match insulin dosing with exercise and healthy food choices. The treatment goal is to maintain the before meal blood sugar (preprandial glucose) level at 70 130 mg/dL. HOME CARE INSTRUCTIONS   Have your hemoglobin A1c level checked twice a year.  Perform daily blood glucose monitoring as directed by your caregiver.  Monitor urine ketones when you are ill and as directed by your caregiver.  Take your diabetes medicine or insulin as directed by your caregiver to maintain your blood glucose levels in the desired range.  Never run out of diabetes medicine or insulin. It is needed every day.  Adjust insulin based on your intake of carbohydrates. Carbohydrates can raise blood glucose levels but need to be included in your diet. Carbohydrates provide vitamins, minerals, and fiber which are an essential part of   a healthy diet. Carbohydrates are found in fruits, vegetables, whole grains, dairy products, legumes, and foods containing added sugars.    Eat healthy foods. Alternate 3 meals with 3 snacks.  Lose weight if overweight.  Carry a medical alert card or wear your medical alert jewelry.  Carry a 15 gram  carbohydrate snack with you at all times to treat low blood glucose (hypoglycemia). Some examples of 15 gram carbohydrate snacks include:  Glucose tablets, 3 or 4   Glucose gel, 15 gram tube  Raisins, 2 tablespoons (24 grams)  Jelly beans, 6  Animal crackers, 8  Regular pop, 4 ounces (120 mL)  Gummy treats, 9  Recognize hypoglycemia. Hypoglycemia occurs with blood glucose levels of 70 mg/dL and below. The risk for hypoglycemia increases when fasting or skipping meals, during or after intense exercise, and during sleep. Hypoglycemia symptoms can include:  Tremors or shakes.  Decreased ability to concentrate.  Sweating.  Increased heart rate.  Headache.  Dry mouth.  Hunger.  Irritability.  Anxiety.  Restless sleep.  Altered speech or coordination.  Confusion.  Treat hypoglycemia promptly. If you are alert and able to safely swallow, follow the 15:15 rule:  Take 15 20 grams of rapid-acting glucose or carbohydrate. Rapid-acting options include glucose gel, glucose tablets, or 4 ounces (120 mL) of fruit juice, regular soda, or low fat milk.  Check your blood glucose level 15 minutes after taking the glucose.  Take 15 20 grams more of glucose if the repeat blood glucose level is still 70 mg/dL or below.  Eat a meal or snack within 1 hour once blood glucose levels return to normal.    Be alert to polyuria and polydipsia which are early signs of hyperglycemia. An early awareness of hyperglycemia allows for prompt treatment. Treat hyperglycemia as directed by your caregiver.  Engage in at least 150 minutes of moderate-intensity physical activity a week, spread over at least 3 days of the week or as directed by your caregiver. In addition, you should engage in resistance exercise at least 2 times a week or as directed by your caregiver.  Adjust your medicine and food intake as needed if you start a new exercise or sport.  Follow your sick day plan at any time you  are unable to eat or drink as usual.  Avoid tobacco use.  Limit alcohol intake to no more than 1 drink per day for nonpregnant women and 2 drinks per day for men. You should drink alcohol only when you are also eating food. Talk with your caregiver whether alcohol is safe for you. Tell your caregiver if you drink alcohol several times a week.  Follow up with your caregiver regularly.  Schedule an eye exam soon after the diagnosis of type 2 diabetes and then annually.  Perform daily skin and foot care. Examine your skin and feet daily for cuts, bruises, redness, nail problems, bleeding, blisters, or sores. A foot exam by a caregiver should be done annually.  Brush your teeth and gums at least twice a day and floss at least once a day. Follow up with your dentist regularly.  Share your diabetes management plan with your workplace or school.  Stay up-to-date with immunizations.  Learn to manage stress.  Obtain ongoing diabetes education and support as needed.  Participate in, or seek rehabilitation as needed to maintain or improve independence and quality of life. Request a physical or occupational therapy referral if you are having foot or hand numbness or difficulties with grooming,   dressing, eating, or physical activity. SEEK MEDICAL CARE IF:   You are unable to eat food or drink fluids for more than 6 hours.  You have nausea and vomiting for more than 6 hours.  Your blood glucose level is over 240 mg/dL.  There is a change in mental status.  You develop an additional serious illness.  You have diarrhea for more than 6 hours.  You have been sick or have had a fever for a couple of days and are not getting better.  You have pain during any physical activity.  SEEK IMMEDIATE MEDICAL CARE IF:  You have difficulty breathing.  You have moderate to large ketone levels. MAKE SURE YOU:  Understand these instructions.  Will watch your condition.  Will get help right away if  you are not doing well or get worse. Document Released: 03/23/2005 Document Revised: 12/16/2011 Document Reviewed: 10/20/2011 [employer] Patient Information 2014 [employer].  

## 2013-01-18 NOTE — Progress Notes (Signed)
Subjective:    Patient ID: [name], female    DOB: 12-15-1950, 62 y.o.   MRN: [id number]  HPI pt returns for f/u of insulin-requiring DM (dx'ed 1994; she has mild if any neuropathy of the lower extremities; she is unaware of any associated chronic complications; she has been on insulin since 2012).  She has not been on any more steroids since last ov.  no cbg record, but states cbg's vary from 53 (afternoon) to 200's (hs and am).  Past Medical History  Diagnosis Date  . Hyperlipidemia   . Hypertension   . Diabetes mellitus   . Asthma   . 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 Hillis Range, residual palpitations  . Vocal cord dysfunction   . Esophageal reflux   . Tremor, essential     Neurontin 600 mg qhs x 1 yr--no help.  Inderal prior to this was helpful for 10 yrs then stopped working.  . Anxiety   . Allergic rhinitis   . Low sodium syndrome   . Depression     Past Surgical History  Procedure Laterality Date  . Tonsillectomy    . Right breast cyst      benign  . Left ankle ligament repair    . Left elbow repair    . Partial hysterectomy    . Cholecystectomy    . Cesarean section    . Ablation for avnrt    . Colonoscopy  01/16/2004    VHQ:IONGEX rectum/colon  . Esophagogastroduodenoscopy (egd) with esophageal dilation  04/03/2002    BMW:UXLKGMWN'U ring, otherwise normal esophagus, status post dilation with 56 F/Normal stomach  . Bartholin gland cyst excision    . Esophagogastroduodenoscopy (egd) with esophageal dilation N/A 11/08/2012    Procedure: ESOPHAGOGASTRODUODENOSCOPY (EGD) WITH ESOPHAGEAL DILATION;  Surgeon: Corbin Ade, MD;  Location: [facility];  Service: Endoscopy;  Laterality: N/A;  11:30  . Abdominal hysterectomy      History   Social History  . Marital Status: Married    Spouse Name: N/A    Number of Children: N/A  . Years of Education: N/A   Occupational History  . Not on file.   Social History  Main Topics  . Smoking status: Former Smoker -- 0.30 packs/day for 10 years    Types: Cigarettes    Quit date: 04/06/1985  . Smokeless tobacco: Never Used  . Alcohol Use: No  . Drug Use: No  . Sexual Activity: No   Other Topics Concern  . Not on file   Social History Narrative   Married, 1 daughte, living.  Lives with husband in [place], [place].   1 child died brain tumor at age 6.   Two grandchildren.   Works at [employer], patient currently lost her job.   Retired 2011.   No tobacco.   Alcohol: none in 30 yrs.  Distant history of heavy use.   No drug use.    Current Outpatient Prescriptions on File Prior to Visit  Medication Sig Dispense Refill  . albuterol (PROAIR HFA) 108 (90 BASE) MCG/ACT inhaler Inhale 2 puffs into the lungs every 6 (six) hours as needed for wheezing.      Marland Kitchen albuterol (PROVENTIL) (2.5 MG/3ML) 0.083% nebulizer solution Take 2.5 mg by nebulization every 6 (six) hours as needed for wheezing or shortness of breath.      Marland Kitchen aspirin EC 81 MG tablet Take 81 mg by mouth every morning.       Marland Kitchen  cyclobenzaprine (FLEXERIL) 10 MG tablet Take 5 mg by mouth 3 (three) times daily as needed for muscle spasms.       Marland Kitchen dexlansoprazole (DEXILANT) 60 MG capsule Take 60 mg by mouth daily.      . DULoxetine (CYMBALTA) 60 MG capsule Take 1 capsule (60 mg total) by mouth daily.  30 capsule  2  . fluticasone-salmeterol (ADVAIR HFA) 115-21 MCG/ACT inhaler 2 puffs then rinse mouth, twice daily  2 Inhaler  0  . Insulin Detemir (LEVEMIR FLEXTOUCH) 100 UNIT/ML SOPN Inject 50 Units into the skin at bedtime.  10 pen  11  . Insulin Lispro, Human, (HUMALOG KWIKPEN [place]) Inject 30 Units into the skin 3 (three) times daily with meals.       Marland Kitchen levothyroxine (SYNTHROID, LEVOTHROID) 75 MCG tablet TAKE ONE TABLET BY MOUTH IN THE MORNING BEFORE BREAKFAST  30 tablet  0  . LORazepam (ATIVAN) 1 MG tablet Take 1 tablet (1 mg total) by mouth 3 (three) times daily.  90 tablet  2  . metoprolol  succinate (TOPROL-XL) 50 MG 24 hr tablet Take 50 mg by mouth 2 (two) times daily. Take with or immediately following a meal.      . mometasone (NASONEX) 50 MCG/ACT nasal spray Place 2 sprays into the nose every morning.       . naproxen (NAPROSYN) 500 MG tablet Take 1 tablet (500 mg total) by mouth 2 (two) times daily with a meal.  60 tablet  3  . olmesartan (BENICAR) 40 MG tablet Take 1 tablet (40 mg total) by mouth daily.  90 tablet  1  . primidone (MYSOLINE) 50 MG tablet Take 150 mg by mouth 2 (two) times daily.      . promethazine-codeine (PHENERGAN WITH CODEINE) 6.25-10 MG/5ML syrup Take 5 mLs by mouth 2 (two) times daily as needed for cough.  120 mL  0  . Pseudoeph-Doxylamine-DM-APAP (NYQUIL PO) Take 5 mLs by mouth at bedtime as needed (sinus drainage).      . sertraline (ZOLOFT) 100 MG tablet Take 100 mg by mouth at bedtime.       Marland Kitchen Spacer/Aero-Holding Chambers (AEROCHAMBER MV) inhaler Use as instructed  1 each  0  . Spacer/Aero-Holding Chambers (AEROCHAMBER MV) inhaler Use as instructed  1 each  0  . Dapagliflozin Propanediol (FARXIGA) 10 MG TABS Take 1 tablet by mouth daily.  90 tablet  3  . sitaGLIPtin (JANUVIA) 100 MG tablet Take 1 tablet (100 mg total) by mouth daily.  90 tablet  3   No current facility-administered medications on file prior to visit.    Allergies  Allergen Reactions  . Metformin And Related     diarrhea  . Guaifenesin     MUCINEX REACTION: asthma attacks  . Oxycodone-Acetaminophen     TYLOX.  . Guaifenesin Er     Family History  Problem Relation Age of Onset  . Lung cancer Father     DIED AGE 20 LUNG CA  . Alcohol abuse Father   . Anxiety disorder Father   . Depression Father   . COPD Mother     DIED AGE 68,MRSA,COPD,PNEUMONIA  . Pneumonia Mother     DIED AGE 68,MRSA,COPD,PNEUMONIA  . Supraventricular tachycardia Mother   . Anxiety disorder Mother   . Depression Mother   . Alcohol abuse Mother   . COPD Brother     AGE 37  . Heart disease  Sister     DIED AGE 26, SMOKER,?HEART  . Colon cancer Neg  Hx     BP 134/80  Pulse 90  SpO2 95%  Review of Systems Denies LOC and weight change.     Objective:   Physical Exam VITAL SIGNS:  See vs page GENERAL: no distress SKIN:  Insulin injection sites at the anterior abdomen are normal   Lab Results  Component Value Date   HGBA1C 9.5* 01/18/2013      Assessment & Plan:  DM: she needs increased rx, but she may also need a simpler regimen.  However, this insulin regimen was chosen from multiple options, as it best matches her insulin to her changing requirements throughout the day.  The benefits of glycemic control must be weighed against the risks of hypoglycemia.   Depression: this complicates the rx of DM.

## 2013-01-18 NOTE — Patient Instructions (Addendum)
check your blood sugar 2 times a day.  vary the time of day when you check, between before the 3 meals, and at bedtime.  also check if you have symptoms of your blood sugar being too high or too low.  please keep a record of the readings and bring it to your next appointment here.  please call us sooner if your blood sugar goes below 70, or if you have a lot of readings over 200.   blood tests are being requested for you today.  We'll contact you with results. Please continue the same levemir, and: Please increase the humalog to 3 times a day (just before each meal) 30-25-40 units Please come back for a follow-up appointment in 1 month.

## 2013-01-19 ENCOUNTER — Telehealth: Payer: Self-pay | Admitting: Internal Medicine

## 2013-01-19 NOTE — Telephone Encounter (Signed)
Pt called stated that her blood sugar drop down to 42 last night at 8:15 pm. Pt is very concern and was wondering what can she do about this. Pt stated that she is on 3 different medications. Please advise

## 2013-01-19 NOTE — Telephone Encounter (Signed)
Please verify this was after evening meal If so, please reduce the supper insulin to 30 units

## 2013-01-19 NOTE — Telephone Encounter (Signed)
Pt advised.

## 2013-01-19 NOTE — Telephone Encounter (Signed)
I agree with the reduction in the insulin dose

## 2013-01-23 ENCOUNTER — Encounter

## 2013-01-24 ENCOUNTER — Encounter: Payer: Self-pay | Admitting: Internal Medicine

## 2013-01-24 ENCOUNTER — Encounter

## 2013-01-24 NOTE — Assessment & Plan Note (Addendum)
Her blood sugar is not well controlled so I have asked her to add [place] and farxiga She was referred for an eye exam

## 2013-01-24 NOTE — Assessment & Plan Note (Signed)
Her TSH is on the normal range 

## 2013-01-24 NOTE — Assessment & Plan Note (Signed)
Her LDL goal has been met

## 2013-01-24 NOTE — Assessment & Plan Note (Signed)
Plan- allergy profile for update

## 2013-01-24 NOTE — Assessment & Plan Note (Signed)
I will recheck her CBC and will look at her vitamin levels as well 

## 2013-01-24 NOTE — Assessment & Plan Note (Signed)
Her BP is well controlled Lytes and renal function are stable 

## 2013-01-24 NOTE — Assessment & Plan Note (Signed)
There is a pseudowheeze component as she squeezes to exhale. We will try Advair HFA to get away from powder and allow use of spacer to reduce thrush

## 2013-01-24 NOTE — Assessment & Plan Note (Signed)
I have asked her to have an U/S done to see if she has fatty liver, mass, obstructive process, etc.

## 2013-01-24 NOTE — Assessment & Plan Note (Signed)
Recurrent problem in this diabetic getting steroidal meds.  Plan emphasize mouth care. Use spacer with steroid inhalers.

## 2013-01-26 ENCOUNTER — Encounter: Payer: Self-pay | Admitting: Family Medicine

## 2013-01-26 ENCOUNTER — Ambulatory Visit (INDEPENDENT_AMBULATORY_CARE_PROVIDER_SITE_OTHER): Payer: BC Managed Care – PPO | Admitting: Family Medicine

## 2013-01-26 ENCOUNTER — Ambulatory Visit: Payer: Self-pay | Admitting: Orthopedic Surgery

## 2013-01-26 VITALS — BP 136/82 | HR 88

## 2013-01-26 DIAGNOSIS — M722 Plantar fascial fibromatosis: Secondary | ICD-10-CM

## 2013-01-26 NOTE — Patient Instructions (Signed)
You are doing great Continue exercises 3 times a week Ice baths 20 minutes after a long day.  Boot only with a lot of walking the next 2 weeks otherwise shoe is fine.  Try not to walk barefoot still for another month.  Come back in 6 weeks.

## 2013-01-26 NOTE — Assessment & Plan Note (Signed)
Patient's tear has seemed to improve significantly. Patient symptomatically is significantly better as well. Patient encouraged to do exercises at least 3 times a week as well as icing 20 minutes nightly. Discuss allowing pain to be her guide if worsens to go back into the boot. Patient will followup again in 6 weeks.

## 2013-01-26 NOTE — Progress Notes (Signed)
  Follow up  CC: Left heel pain follow up  HPI: Patient is a 62 year old female coming in with left heel pain. Patient was diagnosed with a plantar fascia tear previously. Patient states that she is approximately 9200% better. She continues to wear the postop boot. Patient states that she's not having a throbbing at night, does not take the medicines. Patient does state that she has not been doing the exercises on a regular basis and also has not been icing. Patient is still seem to improve very quickly and is very happy with the results.  Past medical, surgical, family and social history reviewed. Medications reviewed all in the electronic medical record.   Review of Systems: No headache, visual changes, nausea, vomiting, diarrhea, constipation, dizziness, abdominal pain, skin rash, fevers, chills, night sweats, weight loss, swollen lymph nodes, body aches, joint swelling, muscle aches, chest pain, shortness of breath, mood changes.   Objective:    Blood pressure 136/82, pulse 88, SpO2 95.00%.   General: No apparent distress alert and oriented x3 mood and affect normal, dressed appropriately. Patient does have a resting tremor HEENT: Pupils equal, extraocular movements intact Respiratory: Patient's speak in full sentences and does not appear short of breath Cardiovascular: No lower extremity edema, non tender, no erythema Skin: Warm dry intact with no signs of infection or rash on extremities or on axial skeleton. Abdomen: Soft nontender Neuro: Cranial nerves II through XII are intact, neurovascularly intact in all extremities with 2+ DTRs and 2+ pulses. Lymph: No lymphadenopathy of posterior or anterior cervical chain or axillae bilaterally.  Gait normal with good balance and coordination.  MSK: Non tender with full range of motion and good stability and symmetric strength and tone of shoulders, elbows, wrist, hip, knees bilaterally.  Foot exam Normal inspection with no visable or palpable  fat pad atrophy and no visible swelling/erythema. Patient is still mildly tender at medial insertion of plantar fascia into calcaneus. Great toe motion: Mild hallux rigidus on the left side compared to the contralateral side Arch shape: Patient has some mild breakdown the transverse and longitudinal bilateral Other foot breakdown: None  Musculoskeletal ultrasound was performed and interpreted by me today. Patient's ultrasound shows she does have some hypoechoic changes at the insertion of the plantar fascia on the calcaneus. There is a tear appreciated. This measures approximately 0.05 mm in length which is improved from 0.2. PF measure 0.62 now which is improved.    Impression and Recommendations:     This case required medical decision making of moderate complexity.

## 2013-01-28 NOTE — Assessment & Plan Note (Signed)
She is clearer today than I have seen in quite a while. Advair is helping. She is a potential Xolair candidate in the future if it can be financed

## 2013-01-28 NOTE — Assessment & Plan Note (Signed)
Emphasis on environmental precautions as educated. Steroid inhaler and antihistamines when appropriate.

## 2013-01-30 MED ADMIN — ioversol (OPTIRAY) 320 mg iodine/mL contrast injection 100 mL: INTRAVENOUS | @ 12:00:00 | NDC 00019132311

## 2013-02-07 ENCOUNTER — Ambulatory Visit
Admission: RE | Admit: 2013-02-07 | Discharge: 2013-02-07 | Disposition: A | Discharge status: Home / Self Care | Payer: BC Managed Care – PPO | Source: Ambulatory Visit | Attending: Internal Medicine | Admitting: Internal Medicine

## 2013-02-07 DIAGNOSIS — R7401 Elevation of levels of liver transaminase levels: Secondary | ICD-10-CM

## 2013-02-22 ENCOUNTER — Other Ambulatory Visit: Payer: Self-pay | Admitting: Endocrinology

## 2013-02-23 ENCOUNTER — Ambulatory Visit (INDEPENDENT_AMBULATORY_CARE_PROVIDER_SITE_OTHER): Payer: BC Managed Care – PPO | Admitting: Endocrinology

## 2013-02-23 ENCOUNTER — Encounter: Payer: Self-pay | Admitting: Endocrinology

## 2013-02-23 VITALS — BP 128/56 | HR 92 | Temp 99.2°F | Resp 16 | Ht 65.0 in | Wt 185.5 lb

## 2013-02-23 DIAGNOSIS — IMO0001 Reserved for inherently not codable concepts without codable children: Secondary | ICD-10-CM

## 2013-02-23 MED ORDER — INSULIN DETEMIR 100 UNIT/ML FLEXPEN: 130.0000 [IU] | PEN_INJECTOR | SUBCUTANEOUS | Status: DC

## 2013-02-23 NOTE — Patient Instructions (Addendum)
check your blood sugar 2 times a day.  vary the time of day when you check, between before the 3 meals, and at bedtime.  also check if you have symptoms of your blood sugar being too high or too low.  please keep a record of the readings and bring it to your next appointment here.  please call us sooner if your blood sugar goes below 70, or if you have a lot of readings over 200.   blood tests are being requested for you today.  We'll contact you with results. Please increase levemir to 130 units each morning, and: Please stop taking the humalog. It is ok to stop taking the 2 diabetes pills if you wish.  Please come back for a follow-up appointment in 1 week.

## 2013-02-23 NOTE — Progress Notes (Signed)
Subjective:    Patient ID: [name], female    DOB: 03-11-1951, 62 y.o.   MRN: [id number]  HPI pt returns for f/u of insulin-requiring DM (dx'ed 1994, on a routine blood test; she has mild if any neuropathy of the lower extremities; she is unaware of any associated chronic complications; she has been on insulin since 2012).  She has not been on any more steroids since last ov. She takes a widely varying dosage of humalog.  farxiga and [place] were recently added.  However, pt says she does not wish to take, due to copays.   Past Medical History  Diagnosis Date  . Hyperlipidemia   . Hypertension   . Diabetes mellitus   . Asthma   . 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 Hillis Range, residual palpitations  . Vocal cord dysfunction   . Esophageal reflux   . Tremor, essential     Neurontin 600 mg qhs x 1 yr--no help.  Inderal prior to this was helpful for 10 yrs then stopped working.  . Anxiety   . Allergic rhinitis   . Low sodium syndrome   . Depression     Past Surgical History  Procedure Laterality Date  . Tonsillectomy    . Right breast cyst      benign  . Left ankle ligament repair    . Left elbow repair    . Partial hysterectomy    . Cholecystectomy    . Cesarean section    . Ablation for avnrt    . Colonoscopy  01/16/2004    WUJ:[id number] rectum/colon  . Esophagogastroduodenoscopy (egd) with esophageal dilation  04/03/2002    NWG:[id number]'M ring, otherwise normal esophagus, status post dilation with 56 F/Normal stomach  . Bartholin gland cyst excision    . Esophagogastroduodenoscopy (egd) with esophageal dilation N/A 11/08/2012    Procedure: ESOPHAGOGASTRODUODENOSCOPY (EGD) WITH ESOPHAGEAL DILATION;  Surgeon: Corbin Ade, MD;  Location: [facility];  Service: Endoscopy;  Laterality: N/A;  11:30  . Abdominal hysterectomy      History   Social History  . Marital Status: Married    Spouse Name: N/A    Number of Children:  N/A  . Years of Education: N/A   Occupational History  . Not on file.   Social History Main Topics  . Smoking status: Former Smoker -- 0.30 packs/day for 10 years    Types: Cigarettes    Quit date: 04/06/1985  . Smokeless tobacco: Never Used  . Alcohol Use: No  . Drug Use: No  . Sexual Activity: No   Other Topics Concern  . Not on file   Social History Narrative   Married, 1 daughte, living.  Lives with husband in [place], [place].   1 child died brain tumor at age 78.   Two grandchildren.   Works at [employer], patient currently lost her job.   Retired 2011.   No tobacco.   Alcohol: none in 30 yrs.  Distant history of heavy use.   No drug use.    Current Outpatient Prescriptions on File Prior to Visit  Medication Sig Dispense Refill  . albuterol (PROAIR HFA) 108 (90 BASE) MCG/ACT inhaler Inhale 2 puffs into the lungs every 6 (six) hours as needed for wheezing.      Marland Kitchen albuterol (PROVENTIL) (2.5 MG/3ML) 0.083% nebulizer solution Take 2.5 mg by nebulization every 6 (six) hours as needed for wheezing or shortness of breath.      Marland Kitchen  aspirin EC 81 MG tablet Take 81 mg by mouth every morning.       . cyclobenzaprine (FLEXERIL) 10 MG tablet Take 5 mg by mouth 3 (three) times daily as needed for muscle spasms.       Marland Kitchen dexlansoprazole (DEXILANT) 60 MG capsule Take 60 mg by mouth daily.      . DULoxetine (CYMBALTA) 60 MG capsule Take 1 capsule (60 mg total) by mouth daily.  30 capsule  2  . fluticasone-salmeterol (ADVAIR HFA) 115-21 MCG/ACT inhaler 2 puffs then rinse mouth, twice daily  2 Inhaler  0  . levothyroxine (SYNTHROID, LEVOTHROID) 75 MCG tablet TAKE ONE TABLET BY MOUTH BEFORE BREAKFAST  30 tablet  6  . LORazepam (ATIVAN) 1 MG tablet Take 1 tablet (1 mg total) by mouth 3 (three) times daily.  90 tablet  2  . metoprolol succinate (TOPROL-XL) 50 MG 24 hr tablet Take 50 mg by mouth 2 (two) times daily. Take with or immediately following a meal.      . mometasone (NASONEX) 50  MCG/ACT nasal spray Place 2 sprays into the nose every morning.       . naproxen (NAPROSYN) 500 MG tablet Take 1 tablet (500 mg total) by mouth 2 (two) times daily with a meal.  60 tablet  3  . olmesartan (BENICAR) 40 MG tablet Take 1 tablet (40 mg total) by mouth daily.  90 tablet  1  . primidone (MYSOLINE) 50 MG tablet Take 150 mg by mouth 2 (two) times daily.      . Pseudoeph-Doxylamine-DM-APAP (NYQUIL PO) Take 5 mLs by mouth at bedtime as needed (sinus drainage).      . sertraline (ZOLOFT) 100 MG tablet Take 100 mg by mouth at bedtime.       Marland Kitchen Spacer/Aero-Holding Chambers (AEROCHAMBER MV) inhaler Use as instructed  1 each  0   No current facility-administered medications on file prior to visit.    Allergies  Allergen Reactions  . Metformin And Related     diarrhea  . Guaifenesin     MUCINEX REACTION: asthma attacks  . Oxycodone-Acetaminophen     TYLOX.  . Guaifenesin Er     Family History  Problem Relation Age of Onset  . Lung cancer Father     DIED AGE 89 LUNG CA  . Alcohol abuse Father   . Anxiety disorder Father   . Depression Father   . COPD Mother     DIED AGE 3,MRSA,COPD,PNEUMONIA  . Pneumonia Mother     DIED AGE 3,MRSA,COPD,PNEUMONIA  . Supraventricular tachycardia Mother   . Anxiety disorder Mother   . Depression Mother   . Alcohol abuse Mother   . COPD Brother     AGE 31  . Heart disease Sister     DIED AGE 45, SMOKER,?HEART  . Colon cancer Neg Hx     BP 128/56  Pulse 92  Temp(Src) 99.2 F (37.3 C) (Oral)  Resp 16  Ht 5\' 5"  (1.651 m)  Wt 185 lb 8 oz (84.142 kg)  BMI 30.87 kg/m2  Review of Systems She has mild hypoglycemia approx once a week, usually in the afternoon.      Objective:   Physical Exam VITAL SIGNS:  See vs page GENERAL: no distress   Lab Results  Component Value Date   HGBA1C 9.5* 01/18/2013      Assessment & Plan:  DM: ongoing poor control.  She needs a simpler insulin regimen.  Depression: this complicates the rx of  DM.

## 2013-02-28 ENCOUNTER — Telehealth: Payer: Self-pay | Admitting: *Deleted

## 2013-02-28 NOTE — Telephone Encounter (Signed)
Please see dr Yetta Barre about the diarrhea, as it is most likely a coincidence

## 2013-02-28 NOTE — Telephone Encounter (Signed)
Pt states last week you took her off her humalog and put her on 130 units of levemir, she said she has been having diarrhea since she started on this.  CB # [id number].

## 2013-03-06 DIAGNOSIS — Z8709 Personal history of other diseases of the respiratory system: Secondary | ICD-10-CM

## 2013-03-06 HISTORY — DX: Personal history of other diseases of the respiratory system: Z87.09

## 2013-03-07 ENCOUNTER — Encounter: Payer: Self-pay | Admitting: Family Medicine

## 2013-03-07 ENCOUNTER — Encounter: Payer: Self-pay | Admitting: Endocrinology

## 2013-03-07 ENCOUNTER — Ambulatory Visit (INDEPENDENT_AMBULATORY_CARE_PROVIDER_SITE_OTHER): Payer: BC Managed Care – PPO | Admitting: Family Medicine

## 2013-03-07 ENCOUNTER — Ambulatory Visit (INDEPENDENT_AMBULATORY_CARE_PROVIDER_SITE_OTHER): Payer: BC Managed Care – PPO | Admitting: Endocrinology

## 2013-03-07 VITALS — BP 118/70 | HR 87 | Temp 99.3°F | Ht 63.0 in | Wt 190.4 lb

## 2013-03-07 VITALS — BP 114/78 | HR 101

## 2013-03-07 DIAGNOSIS — G5701 Lesion of sciatic nerve, right lower limb: Secondary | ICD-10-CM

## 2013-03-07 DIAGNOSIS — IMO0001 Reserved for inherently not codable concepts without codable children: Secondary | ICD-10-CM

## 2013-03-07 DIAGNOSIS — G5700 Lesion of sciatic nerve, unspecified lower limb: Secondary | ICD-10-CM

## 2013-03-07 MED ORDER — CYCLOBENZAPRINE HCL 10 MG PO TABS: 5.0000 mg | ORAL_TABLET | Freq: Three times a day (TID) | ORAL | Status: DC | PRN

## 2013-03-07 MED ORDER — INSULIN DETEMIR 100 UNIT/ML FLEXPEN: 180.0000 [IU] | PEN_INJECTOR | SUBCUTANEOUS | Status: DC

## 2013-03-07 NOTE — Progress Notes (Signed)
Pre-visit discussion using our clinic review tool. No additional management support is needed unless otherwise documented below in the visit note.  

## 2013-03-07 NOTE — Assessment & Plan Note (Signed)
Piriformis Syndrome  Using an anatomical model, reviewed with the patient the structures involved and how they related to diagnosis. The patient indicated understanding.   The patient was given a handout from Dr. Ailene Ards book "The Sports Medicine Patient Advisor" describing the anatomy and rehabilitation of the following condition: Piriformis Syndrome  Also given a handout with more extensive Piriformis stretching, hip flexor and abductor strengthening, ham stretching  Rec deep massage, explained self-massage with ball See patient instruction RTC in 4 weeks if not better than will do PT and ? Injection.

## 2013-03-07 NOTE — Patient Instructions (Signed)
Very nice to see you Scientist, research (life sciences).  PIRIFORMIS SYNDROME REHAB 1. Work on pretzel stretching, shoulder back and leg draped in front. 3-5 sets, 30 sec.. 2. hip abductor rotations. standing, hip flexion and rotation outward then inward. 3 sets, 15 reps. when can do comfortably, add ankle weights starting at 2 pounds.  3. cross over stretching - shoulder back to ground, same side leg crossover. 3-5 sets for 30 min..  4. SINK STRETCH - YOU CAN DO THIS WHENEVER YOU WANT DURING THE DAY  Tennis ball underneath area in buttocks - on a hard surface underneath Try this and come back in 4 weeks.  Naproxen daily for 1 week.

## 2013-03-07 NOTE — Progress Notes (Signed)
Subjective:    Patient ID: [name], female    DOB: 09/01/1950, 62 y.o.   MRN: [id number]  HPI pt returns for f/u of insulin-requiring DM (dx'ed 1994, on a routine blood test; she has mild if any neuropathy of the lower extremities; she is unaware of any associated chronic complications; she has been on insulin since 2012).  In November of 2014, she was changed to a simpler qd insulin regimen, which she says she prefers.  she brings a record of her cbg's which i have reviewed today.  It varies from 157 (after exercise) to 300.  There is otherwise no trend throughout the day.   Past Medical History  Diagnosis Date  . Hyperlipidemia   . Hypertension   . Diabetes mellitus   . Asthma   . 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 Hillis Range, residual palpitations  . Vocal cord dysfunction   . Esophageal reflux   . Tremor, essential     Neurontin 600 mg qhs x 1 yr--no help.  Inderal prior to this was helpful for 10 yrs then stopped working.  . Anxiety   . Allergic rhinitis   . Low sodium syndrome   . Depression     Past Surgical History  Procedure Laterality Date  . Tonsillectomy    . Right breast cyst      benign  . Left ankle ligament repair    . Left elbow repair    . Partial hysterectomy    . Cholecystectomy    . Cesarean section    . Ablation for avnrt    . Colonoscopy  01/16/2004    YQM:VHQION rectum/colon  . Esophagogastroduodenoscopy (egd) with esophageal dilation  04/03/2002    GEX:[id number]'M ring, otherwise normal esophagus, status post dilation with 56 F/Normal stomach  . Bartholin gland cyst excision    . Esophagogastroduodenoscopy (egd) with esophageal dilation N/A 11/08/2012    Procedure: ESOPHAGOGASTRODUODENOSCOPY (EGD) WITH ESOPHAGEAL DILATION;  Surgeon: Corbin Ade, MD;  Location: [facility];  Service: Endoscopy;  Laterality: N/A;  11:30  . Abdominal hysterectomy      History   Social History  . Marital  Status: Married    Spouse Name: N/A    Number of Children: N/A  . Years of Education: N/A   Occupational History  . Not on file.   Social History Main Topics  . Smoking status: Former Smoker -- 0.30 packs/day for 10 years    Types: Cigarettes    Quit date: 04/06/1985  . Smokeless tobacco: Never Used  . Alcohol Use: No  . Drug Use: No  . Sexual Activity: No   Other Topics Concern  . Not on file   Social History Narrative   Married, 1 daughte, living.  Lives with husband in [place], [place].   1 child died brain tumor at age 65.   Two grandchildren.   Works at [employer], patient currently lost her job.   Retired 2011.   No tobacco.   Alcohol: none in 30 yrs.  Distant history of heavy use.   No drug use.    Current Outpatient Prescriptions on File Prior to Visit  Medication Sig Dispense Refill  . albuterol (PROAIR HFA) 108 (90 BASE) MCG/ACT inhaler Inhale 2 puffs into the lungs every 6 (six) hours as needed for wheezing.      Marland Kitchen albuterol (PROVENTIL) (2.5 MG/3ML) 0.083% nebulizer solution Take 2.5 mg by nebulization every 6 (six)  hours as needed for wheezing or shortness of breath.      Marland Kitchen aspirin EC 81 MG tablet Take 81 mg by mouth every morning.       . cyclobenzaprine (FLEXERIL) 10 MG tablet Take 0.5 tablets (5 mg total) by mouth 3 (three) times daily as needed for muscle spasms.  30 tablet  1  . dexlansoprazole (DEXILANT) 60 MG capsule Take 60 mg by mouth daily.      . DULoxetine (CYMBALTA) 60 MG capsule Take 1 capsule (60 mg total) by mouth daily.  30 capsule  2  . fluticasone-salmeterol (ADVAIR HFA) 115-21 MCG/ACT inhaler 2 puffs then rinse mouth, twice daily  2 Inhaler  0  . levothyroxine (SYNTHROID, LEVOTHROID) 75 MCG tablet TAKE ONE TABLET BY MOUTH BEFORE BREAKFAST  30 tablet  6  . LORazepam (ATIVAN) 1 MG tablet Take 1 tablet (1 mg total) by mouth 3 (three) times daily.  90 tablet  2  . metoprolol succinate (TOPROL-XL) 50 MG 24 hr tablet Take 50 mg by mouth 2 (two)  times daily. Take with or immediately following a meal.      . mometasone (NASONEX) 50 MCG/ACT nasal spray Place 2 sprays into the nose every morning.       . naproxen (NAPROSYN) 500 MG tablet Take 1 tablet (500 mg total) by mouth 2 (two) times daily with a meal.  60 tablet  3  . olmesartan (BENICAR) 40 MG tablet Take 1 tablet (40 mg total) by mouth daily.  90 tablet  1  . primidone (MYSOLINE) 50 MG tablet Take 150 mg by mouth 2 (two) times daily.      . Pseudoeph-Doxylamine-DM-APAP (NYQUIL PO) Take 5 mLs by mouth at bedtime as needed (sinus drainage).      . sertraline (ZOLOFT) 100 MG tablet Take 100 mg by mouth at bedtime.       Marland Kitchen Spacer/Aero-Holding Chambers (AEROCHAMBER MV) inhaler Use as instructed  1 each  0   No current facility-administered medications on file prior to visit.    Allergies  Allergen Reactions  . Metformin And Related     diarrhea  . Guaifenesin     MUCINEX REACTION: asthma attacks  . Oxycodone-Acetaminophen     TYLOX.  . Guaifenesin Er     Family History  Problem Relation Age of Onset  . Lung cancer Father     DIED AGE 23 LUNG CA  . Alcohol abuse Father   . Anxiety disorder Father   . Depression Father   . COPD Mother     DIED AGE 56,MRSA,COPD,PNEUMONIA  . Pneumonia Mother     DIED AGE 56,MRSA,COPD,PNEUMONIA  . Supraventricular tachycardia Mother   . Anxiety disorder Mother   . Depression Mother   . Alcohol abuse Mother   . COPD Brother     AGE 64  . Heart disease Sister     DIED AGE 59, SMOKER,?HEART  . Colon cancer Neg Hx    BP 118/70  Pulse 87  Temp(Src) 99.3 F (37.4 C) (Oral)  Ht 5\' 3"  (1.6 m)  Wt 190 lb 6 oz (86.354 kg)  BMI 33.73 kg/m2  SpO2 97%  Review of Systems denies hypoglycemia and weight change.      Objective:   Physical Exam VITAL SIGNS:  See vs page GENERAL: no distress PSYCH: Alert and oriented x 3.  Does not appear anxious nor depressed.  Lab Results  Component Value Date   HGBA1C 9.5* 01/18/2013  Assessment & Plan:  DM: This insulin regimen was chosen from multiple options, for its simplicity.  The benefits of glycemic control must be weighed against the risks of hypoglycemia.  She needs increased rx. A-V nodal dysrhythmia: in this context, she should avoid hypoglycemia.

## 2013-03-07 NOTE — Progress Notes (Signed)
  Follow up  CC: Left heel pain follow up  new back pain  HPI: Patient is a very pleasant 62 year old female who I seen before for plantar fascial tear that she says is completely resolved at this time. Patient is having no pain whatsoever. Patient is coming in with a new problem though of right sided back pain. Patient states that this is in the low back and seems to radiate into her buttocks. Patient states from time to time it seems tissue down her leg. Patient has been given a diagnosis of sciatica previously. Patient states that this seems to get better and get worse intermittently. Patient states wearing the Cam Walker seem to exacerbate this problem. Patient denies any numbness or weakness. States that the pain is worse with sitting or going upstairs. Patient has not tried many modalities but states that the muscle relaxants that she has seems to be helpful. Patient that the pain is 6/10 denies any nighttime awakening.  Past medical, surgical, family and social history reviewed. Medications reviewed all in the electronic medical record.   Review of Systems: No headache, visual changes, nausea, vomiting, diarrhea, constipation, dizziness, abdominal pain, skin rash, fevers, chills, night sweats, weight loss, swollen lymph nodes, body aches, joint swelling, muscle aches, chest pain, shortness of breath, mood changes.   Objective:    Blood pressure 114/78, pulse 101, SpO2 94.00%.   General: No apparent distress alert and oriented x3 mood and affect normal, dressed appropriately. Patient does have a resting tremor HEENT: Pupils equal, extraocular movements intact Respiratory: Patient's speak in full sentences and does not appear short of breath Cardiovascular: No lower extremity edema, non tender, no erythema Skin: Warm dry intact with no signs of infection or rash on extremities or on axial skeleton. Abdomen: Soft nontender Neuro: Cranial nerves II through XII are intact, neurovascularly  intact in all extremities with 2+ DTRs and 2+ pulses. Lymph: No lymphadenopathy of posterior or anterior cervical chain or axillae bilaterally.  Gait mild wide-base MSK: Non tender with full range of motion and good stability and symmetric strength and tone of shoulders, elbows, wrist,  knees bilaterally.  Back Exam:  Inspection: Unremarkable  Motion: Flexion 35 deg, Extension 35 deg, Side Bending to 35 deg bilaterally,  Rotation to 45 deg bilaterally  SLR laying: Negative  XSLR laying: Negative  Palpable tenderness: Positive over piriformis. FABER: Positive. Sensory change: Gross sensation intact to all lumbar and sacral dermatomes.  Reflexes: 2+ at both patellar tendons, 2+ at achilles tendons, Babinski's downgoing.  Strength at foot  Plantar-flexion: 5/5 Dorsi-flexion: 5/5 Eversion: 5/5 Inversion: 5/5  Leg strength  Quad: 5/5 Hamstring: 5/5 Hip flexor: 5/5 Hip abductors: 3/5  Gait unremarkable.    Impression and Recommendations:     This case required medical decision making of moderate complexity.

## 2013-03-07 NOTE — Patient Instructions (Addendum)
check your blood sugar 2 times a day.  vary the time of day when you check, between before the 3 meals, and at bedtime.  also check if you have symptoms of your blood sugar being too high or too low.  please keep a record of the readings and bring it to your next appointment here.  please call us sooner if your blood sugar goes below 70, or if you have a lot of readings over 200.   Please increase levemir to 180 units each morning.  However, if you are going to be active that day, take just 140 units. Please come back for a follow-up appointment in January.

## 2013-03-09 ENCOUNTER — Ambulatory Visit (INDEPENDENT_AMBULATORY_CARE_PROVIDER_SITE_OTHER): Payer: BC Managed Care – PPO | Admitting: Psychiatry

## 2013-03-09 ENCOUNTER — Encounter (HOSPITAL_COMMUNITY): Payer: Self-pay | Admitting: Psychiatry

## 2013-03-09 VITALS — BP 130/80 | Ht 63.0 in | Wt 192.0 lb

## 2013-03-09 DIAGNOSIS — F411 Generalized anxiety disorder: Secondary | ICD-10-CM

## 2013-03-09 MED ORDER — DULOXETINE HCL 60 MG PO CPEP: 60.0000 mg | ORAL_CAPSULE | Freq: Every day | ORAL | Status: DC

## 2013-03-09 MED ORDER — LORAZEPAM 1 MG PO TABS: 1.0000 mg | ORAL_TABLET | Freq: Three times a day (TID) | ORAL | Status: DC

## 2013-03-09 MED ORDER — SERTRALINE HCL 100 MG PO TABS: ORAL_TABLET | ORAL | Status: DC
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
## 2013-03-10 ENCOUNTER — Encounter: Payer: Self-pay | Admitting: Internal Medicine

## 2013-03-10 ENCOUNTER — Ambulatory Visit (INDEPENDENT_AMBULATORY_CARE_PROVIDER_SITE_OTHER): Payer: BC Managed Care – PPO | Admitting: Internal Medicine

## 2013-03-10 ENCOUNTER — Ambulatory Visit
Admission: RE | Admit: 2013-03-10 | Discharge: 2013-03-10 | Disposition: A | Discharge status: Home / Self Care | Payer: BC Managed Care – PPO | Source: Ambulatory Visit | Attending: Internal Medicine | Admitting: Internal Medicine

## 2013-03-10 VITALS — BP 122/62 | HR 89 | Ht 64.5 in | Wt 192.8 lb

## 2013-03-10 DIAGNOSIS — J45909 Unspecified asthma, uncomplicated: Secondary | ICD-10-CM

## 2013-03-10 DIAGNOSIS — Z1231 Encounter for screening mammogram for malignant neoplasm of breast: Secondary | ICD-10-CM

## 2013-03-10 DIAGNOSIS — IMO0001 Reserved for inherently not codable concepts without codable children: Secondary | ICD-10-CM

## 2013-03-10 DIAGNOSIS — J4520 Mild intermittent asthma, uncomplicated: Secondary | ICD-10-CM

## 2013-03-10 LAB — HM MAMMOGRAPHY: HM Mammogram: NORMAL

## 2013-03-10 MED ORDER — FLUTICASONE-SALMETEROL 115-21 MCG/ACT IN AERO: INHALATION_SPRAY | RESPIRATORY_TRACT | Status: DC
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
## 2013-03-10 NOTE — Patient Instructions (Signed)
Sample and refill script for Advair 115 HFA    2 puffs then rinse mouth, twice daily  Please call as needed

## 2013-03-17 ENCOUNTER — Ambulatory Visit (INDEPENDENT_AMBULATORY_CARE_PROVIDER_SITE_OTHER): Payer: BC Managed Care – PPO | Admitting: Cardiovascular Disease

## 2013-03-17 ENCOUNTER — Encounter: Payer: Self-pay | Admitting: Cardiovascular Disease

## 2013-03-17 VITALS — BP 135/77 | HR 80 | Ht 64.0 in | Wt 190.0 lb

## 2013-03-17 DIAGNOSIS — I498 Other specified cardiac arrhythmias: Secondary | ICD-10-CM

## 2013-03-17 DIAGNOSIS — I10 Essential (primary) hypertension: Secondary | ICD-10-CM

## 2013-03-17 DIAGNOSIS — I471 Supraventricular tachycardia: Secondary | ICD-10-CM

## 2013-03-17 DIAGNOSIS — E119 Type 2 diabetes mellitus without complications: Secondary | ICD-10-CM

## 2013-03-17 MED ORDER — OLMESARTAN MEDOXOMIL 40 MG PO TABS: 40.0000 mg | ORAL_TABLET | Freq: Every day | ORAL | Status: DC

## 2013-03-17 NOTE — Progress Notes (Signed)
Patient ID: [name], female   DOB: 07-10-1950, 62 y.o.   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 Johney Frame in 2009. She also has a history of hypertension, hyperlipidemia and diabetes mellitus. She has had chest pain in the past which was deemed gastrointestinal in etiology. It was recommended she undergo an exercise treadmill stress test by Dr. Antoine Poche in July of this year, but I do not see that she ever followed up on this.  She is actually doing very well, and denies chest pain, shortness of breath and palpitations. Her husband has had a gastrointestinal illness apparently, and has been depressed. She said that she only gets palpitations when she is upset with her husband. She ran out of Benicar a short while ago but her blood pressure is within normal limits.    Allergies  Allergen Reactions  . Metformin And Related     diarrhea  . Guaifenesin     MUCINEX REACTION: asthma attacks  . Oxycodone-Acetaminophen     TYLOX.  . Guaifenesin Er     Current Outpatient Prescriptions  Medication Sig Dispense Refill  . albuterol (PROAIR HFA) 108 (90 BASE) MCG/ACT inhaler Inhale 2 puffs into the lungs every 6 (six) hours as needed for wheezing.      Marland Kitchen albuterol (PROVENTIL) (2.5 MG/3ML) 0.083% nebulizer solution Take 2.5 mg by nebulization every 6 (six) hours as needed for wheezing or shortness of breath.      Marland Kitchen aspirin EC 81 MG tablet Take 81 mg by mouth every morning.       . cyclobenzaprine (FLEXERIL) 10 MG tablet Take 0.5 tablets (5 mg total) by mouth 3 (three) times daily as needed for muscle spasms.  30 tablet  1  . DULoxetine (CYMBALTA) 60 MG capsule Take 1 capsule (60 mg total) by mouth daily.  30 capsule  2  . fluticasone-salmeterol (ADVAIR HFA) 115-21 MCG/ACT inhaler 2 puffs then rinse mouth, twice daily  1 Inhaler  prn  . Insulin Detemir 100 UNIT/ML SOPN Inject 180 Units into the skin every  morning. And pen needles 1/day  20 pen  11  . levothyroxine (SYNTHROID, LEVOTHROID) 75 MCG tablet TAKE ONE TABLET BY MOUTH BEFORE BREAKFAST  30 tablet  6  . LORazepam (ATIVAN) 1 MG tablet Take 1 tablet (1 mg total) by mouth 3 (three) times daily.  90 tablet  2  . metoprolol succinate (TOPROL-XL) 50 MG 24 hr tablet Take 50 mg by mouth 2 (two) times daily. Take with or immediately following a meal.      . mometasone (NASONEX) 50 MCG/ACT nasal spray Place 2 sprays into the nose every morning.       . naproxen (NAPROSYN) 500 MG tablet Take 1 tablet (500 mg total) by mouth 2 (two) times daily with a meal.  60 tablet  3  . primidone (MYSOLINE) 50 MG tablet Take 150 mg by mouth 2 (two) times daily.      . Pseudoeph-Doxylamine-DM-APAP (NYQUIL PO) Take 5 mLs by mouth at bedtime as needed (sinus drainage).      . sertraline (ZOLOFT) 100 MG tablet Take and one and one half tablets at bedtime  45 tablet  3  . Spacer/Aero-Holding Chambers (AEROCHAMBER MV) inhaler Use as instructed  1 each  0  . olmesartan (BENICAR) 40 MG tablet Take 1 tablet (40 mg total) by mouth daily.  90 tablet  1  No current facility-administered medications for this visit.    Past Medical History  Diagnosis Date  . Hyperlipidemia   . Hypertension   . Diabetes mellitus   . Asthma   . 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 Hillis Range, residual palpitations  . Vocal cord dysfunction   . Esophageal reflux   . Tremor, essential     Neurontin 600 mg qhs x 1 yr--no help.  Inderal prior to this was helpful for 10 yrs then stopped working.  . Anxiety   . Allergic rhinitis   . Low sodium syndrome   . Depression     Past Surgical History  Procedure Laterality Date  . Tonsillectomy    . Right breast cyst      benign  . Left ankle ligament repair    . Left elbow repair    . Partial hysterectomy    . Cholecystectomy    . Cesarean section    . Ablation for avnrt    . Colonoscopy   01/16/2004    WRU:EAVWUJ rectum/colon  . Esophagogastroduodenoscopy (egd) with esophageal dilation  04/03/2002    WJX:[id number]'A ring, otherwise normal esophagus, status post dilation with 56 F/Normal stomach  . Bartholin gland cyst excision    . Esophagogastroduodenoscopy (egd) with esophageal dilation N/A 11/08/2012    Procedure: ESOPHAGOGASTRODUODENOSCOPY (EGD) WITH ESOPHAGEAL DILATION;  Surgeon: Corbin Ade, MD;  Location: [facility];  Service: Endoscopy;  Laterality: N/A;  11:30  . Abdominal hysterectomy      History   Social History  . Marital Status: Married    Spouse Name: N/A    Number of Children: N/A  . Years of Education: N/A   Occupational History  . Not on file.   Social History Main Topics  . Smoking status: Former Smoker -- 0.30 packs/day for 10 years    Types: Cigarettes    Quit date: 04/06/1985  . Smokeless tobacco: Never Used  . Alcohol Use: No  . Drug Use: No  . Sexual Activity: No   Other Topics Concern  . Not on file   Social History Narrative   Married, 1 daughte, living.  Lives with husband in [place], [place].   1 child died brain tumor at age 48.   Two grandchildren.   Works at [employer], patient currently lost her job.   Retired 2011.   No tobacco.   Alcohol: none in 30 yrs.  Distant history of heavy use.   No drug use.     Filed Vitals:   03/17/13 0840  BP: 135/77  Pulse: 80  Height: 5\' 4"  (1.626 m)  Weight: 190 lb (86.183 kg)    PHYSICAL EXAM General: NAD Neck: No JVD, no thyromegaly or thyroid nodule.  Lungs: Clear to auscultation bilaterally with normal respiratory effort. CV: Nondisplaced PMI.  Heart regular S1/S2, no S3/S4, no murmur.  No peripheral edema.  No carotid bruit.  Normal pedal pulses.  Abdomen: Soft, nontender, no hepatosplenomegaly, no distention.  Neurologic: Alert and oriented x 3.  Psych: Normal affect. Extremities: No clubbing or cyanosis.   ECG: reviewed and available in electronic  records.      ASSESSMENT AND PLAN: 1. AVNRT s/p ablation in 2009: symptomatically stable without recurrences. No further treatment is necessary. 2. HTN: will refill Benicar, given its protective effects on the kidneys given her diabetes. BP controlled today.   Prentice Docker, M.D., F.A.C.C.

## 2013-03-17 NOTE — Patient Instructions (Signed)
Your physician wants you to follow-up in: ONE YEAR You will receive a reminder letter in the mail two months in advance. If you don't receive a letter, please call our office to schedule the follow-up appointment.  

## 2013-03-20 ENCOUNTER — Telehealth: Payer: Self-pay | Admitting: Cardiovascular Disease

## 2013-03-20 ENCOUNTER — Other Ambulatory Visit: Payer: Self-pay

## 2013-03-20 MED ORDER — OLMESARTAN MEDOXOMIL 40 MG PO TABS: 40.0000 mg | ORAL_TABLET | Freq: Every day | ORAL | Status: DC

## 2013-03-20 NOTE — Telephone Encounter (Signed)
Called pt pharmacy to receive a PA, was given a different number to call as [phone] opt 3, then opt 1 this nurse contacted number given, given BC# [id number], rep reviewed pt account and advised she will fax over the form to be completed by the provider, will complete once received

## 2013-03-20 NOTE — Telephone Encounter (Signed)
[name] is a [facility] patient please have staff do PA

## 2013-03-20 NOTE — Telephone Encounter (Signed)
Received fax refill request  Rx # [id number] Medication:  Benicar 40 mg tab Qty 90 Sig:  Take one tablet by mouth once daily Physician:  Purvis Sheffield  **PA required call [phone]

## 2013-03-29 NOTE — Telephone Encounter (Signed)
Received call from [facility] stating returning call to nurse Selena Batten.  In regards to PA denial.  They will not be in the office on Friday 03/31/13, however can call back on Monday to [phone], use reference key # [id number].

## 2013-03-31 NOTE — Telephone Encounter (Signed)
Called number provided and noted business closed for the holiday, will call again Monday

## 2013-04-02 NOTE — Assessment & Plan Note (Signed)
Better control recently Plan-refill Advair

## 2013-04-04 MED ORDER — LOSARTAN POTASSIUM 50 MG PO TABS: 50.0000 mg | ORAL_TABLET | Freq: Every day | ORAL | Status: DC

## 2013-04-04 NOTE — Telephone Encounter (Signed)
Received fax to advise the pt needs to have tried Losartan, or combo of Losartan HCTZ, and many other medications the pt has not tried per review of medication history, pt confirmed she is out of the samples we gave her, noted 20mg  samples placed up front to double for pt to consume 40mg  once daily, pt will pick up today and pt made aware that Dr. Purvis Sheffield is out of the office this week therefore we will have Lorin Picket NP review if the pt needs to try another medication, pt understood and paperwork placed on [facility] desk for review.

## 2013-04-04 NOTE — Telephone Encounter (Signed)
Noted this nurse out of office on Monday, called number provided, spoke with rep Luna Kitchens and she noted the denial on 03-24-13 of the pt benicar, call then transferred to [facility] to advise she will fax over a list of medications that the pt will need to have tried prior in order to receive the benicar to see if the PA denial could be overturned, this nurse will review the fax and send back in once completed

## 2013-04-04 NOTE — Telephone Encounter (Signed)
Received written instructions from Lorin Picket NP for the pt to start Losartan 50mg  once daily, pt made aware and new medication sent via escribe to pharmacy advised

## 2013-04-05 ENCOUNTER — Encounter (HOSPITAL_COMMUNITY): Payer: Self-pay | Admitting: Emergency Medicine

## 2013-04-05 ENCOUNTER — Inpatient Hospital Stay (HOSPITAL_COMMUNITY)
Admission: EM | Admit: 2013-04-05 | Discharge: 2013-04-12 | DRG: 637 | Disposition: A | Discharge status: Home / Self Care | Payer: BC Managed Care – PPO | Attending: Internal Medicine | Admitting: Internal Medicine

## 2013-04-05 ENCOUNTER — Emergency Department (HOSPITAL_COMMUNITY)
Admission: EM | Admit: 2013-04-05 | Discharge: 2013-04-05 | Disposition: A | Discharge status: Home / Self Care | Payer: BC Managed Care – PPO | Attending: Emergency Medicine | Admitting: Emergency Medicine

## 2013-04-05 ENCOUNTER — Emergency Department (HOSPITAL_COMMUNITY): Payer: BC Managed Care – PPO

## 2013-04-05 DIAGNOSIS — E785 Hyperlipidemia, unspecified: Secondary | ICD-10-CM | POA: Diagnosis present

## 2013-04-05 DIAGNOSIS — Z91199 Patient's noncompliance with other medical treatment and regimen due to unspecified reason: Secondary | ICD-10-CM

## 2013-04-05 DIAGNOSIS — Z22322 Carrier or suspected carrier of Methicillin resistant Staphylococcus aureus: Secondary | ICD-10-CM

## 2013-04-05 DIAGNOSIS — J45909 Unspecified asthma, uncomplicated: Secondary | ICD-10-CM | POA: Insufficient documentation

## 2013-04-05 DIAGNOSIS — Z794 Long term (current) use of insulin: Secondary | ICD-10-CM | POA: Insufficient documentation

## 2013-04-05 DIAGNOSIS — F329 Major depressive disorder, single episode, unspecified: Secondary | ICD-10-CM | POA: Diagnosis present

## 2013-04-05 DIAGNOSIS — J383 Other diseases of vocal cords: Secondary | ICD-10-CM | POA: Diagnosis present

## 2013-04-05 DIAGNOSIS — Z79899 Other long term (current) drug therapy: Secondary | ICD-10-CM

## 2013-04-05 DIAGNOSIS — N751 Abscess of Bartholin's gland: Secondary | ICD-10-CM | POA: Insufficient documentation

## 2013-04-05 DIAGNOSIS — F411 Generalized anxiety disorder: Secondary | ICD-10-CM | POA: Insufficient documentation

## 2013-04-05 DIAGNOSIS — R569 Unspecified convulsions: Secondary | ICD-10-CM

## 2013-04-05 DIAGNOSIS — Z7982 Long term (current) use of aspirin: Secondary | ICD-10-CM | POA: Insufficient documentation

## 2013-04-05 DIAGNOSIS — G9340 Encephalopathy, unspecified: Secondary | ICD-10-CM

## 2013-04-05 DIAGNOSIS — F3289 Other specified depressive episodes: Secondary | ICD-10-CM | POA: Insufficient documentation

## 2013-04-05 DIAGNOSIS — D72829 Elevated white blood cell count, unspecified: Secondary | ICD-10-CM | POA: Diagnosis present

## 2013-04-05 DIAGNOSIS — IMO0002 Reserved for concepts with insufficient information to code with codable children: Secondary | ICD-10-CM | POA: Insufficient documentation

## 2013-04-05 DIAGNOSIS — E119 Type 2 diabetes mellitus without complications: Secondary | ICD-10-CM | POA: Insufficient documentation

## 2013-04-05 DIAGNOSIS — I10 Essential (primary) hypertension: Secondary | ICD-10-CM | POA: Insufficient documentation

## 2013-04-05 DIAGNOSIS — R102 Pelvic and perineal pain: Secondary | ICD-10-CM

## 2013-04-05 DIAGNOSIS — Z9119 Patient's noncompliance with other medical treatment and regimen: Secondary | ICD-10-CM

## 2013-04-05 DIAGNOSIS — N750 Cyst of Bartholin's gland: Secondary | ICD-10-CM

## 2013-04-05 DIAGNOSIS — N179 Acute kidney failure, unspecified: Secondary | ICD-10-CM

## 2013-04-05 DIAGNOSIS — Z87891 Personal history of nicotine dependence: Secondary | ICD-10-CM | POA: Insufficient documentation

## 2013-04-05 DIAGNOSIS — E039 Hypothyroidism, unspecified: Secondary | ICD-10-CM

## 2013-04-05 DIAGNOSIS — G250 Essential tremor: Secondary | ICD-10-CM | POA: Insufficient documentation

## 2013-04-05 DIAGNOSIS — E876 Hypokalemia: Secondary | ICD-10-CM | POA: Diagnosis present

## 2013-04-05 DIAGNOSIS — K219 Gastro-esophageal reflux disease without esophagitis: Secondary | ICD-10-CM

## 2013-04-05 DIAGNOSIS — E1010 Type 1 diabetes mellitus with ketoacidosis without coma: Principal | ICD-10-CM | POA: Diagnosis present

## 2013-04-05 DIAGNOSIS — E1110 Type 2 diabetes mellitus with ketoacidosis without coma: Secondary | ICD-10-CM

## 2013-04-05 HISTORY — DX: Unspecified convulsions: R56.9

## 2013-04-05 LAB — URINALYSIS, ROUTINE W REFLEX MICROSCOPIC
Bilirubin Urine: NEGATIVE
Glucose, UA: >1000 mg/dL — AB
Ketones, ur: >80 mg/dL — AB
Leukocytes, UA: NEGATIVE
Nitrite: NEGATIVE
Protein, ur: 100 mg/dL — AB
Specific Gravity, Urine: >1.03 — ABNORMAL HIGH (ref 1.005–1.030)
Urobilinogen, UA: 0.2 mg/dL (ref 0.0–1.0)
pH: 5.5 (ref 5.0–8.0)

## 2013-04-05 LAB — CBC WITH DIFFERENTIAL/PLATELET
Basophils Absolute: 0 10*3/uL (ref 0.0–0.1)
Basophils Relative: 0 % (ref 0–1)
Eosinophils Absolute: 0 10*3/uL (ref 0.0–0.7)
Eosinophils Relative: 0 % (ref 0–5)
HCT: 41.8 % (ref 36.0–46.0)
Hemoglobin: 14.5 g/dL (ref 12.0–15.0)
Lymphocytes Relative: 14 % (ref 12–46)
Lymphs Abs: 2.1 10*3/uL (ref 0.7–4.0)
MCH: 30.1 pg (ref 26.0–34.0)
MCHC: 34.7 g/dL (ref 30.0–36.0)
MCV: 86.7 fL (ref 78.0–100.0)
Monocytes Absolute: 0.4 10*3/uL (ref 0.1–1.0)
Monocytes Relative: 3 % (ref 3–12)
Neutro Abs: 12.5 10*3/uL — ABNORMAL HIGH (ref 1.7–7.7)
Neutrophils Relative %: 83 % — ABNORMAL HIGH (ref 43–77)
Platelets: 237 10*3/uL (ref 150–400)
RBC: 4.82 MIL/uL (ref 3.87–5.11)
RDW: 12.7 % (ref 11.5–15.5)
WBC: 15 10*3/uL — ABNORMAL HIGH (ref 4.0–10.5)

## 2013-04-05 LAB — MAGNESIUM: Magnesium: 1.8 mg/dL (ref 1.5–2.5)

## 2013-04-05 LAB — BLOOD GAS, ARTERIAL
Acid-base deficit: 10.8 mmol/L — ABNORMAL HIGH (ref 0.0–2.0)
Bicarbonate: 14.3 mEq/L — ABNORMAL LOW (ref 20.0–24.0)
Drawn by: 38235
FIO2: 0.21 %
O2 Saturation: 96.1 %
Patient temperature: 37
TCO2: 13 mmol/L (ref 0–100)
pCO2 arterial: 30 mmHg — ABNORMAL LOW (ref 35.0–45.0)
pH, Arterial: 7.3 — ABNORMAL LOW (ref 7.350–7.450)
pO2, Arterial: 87 mmHg (ref 80.0–100.0)

## 2013-04-05 LAB — RAPID URINE DRUG SCREEN, HOSP PERFORMED
Amphetamines: NOT DETECTED
Barbiturates: POSITIVE — AB
Benzodiazepines: NOT DETECTED
Cocaine: NOT DETECTED
Opiates: POSITIVE — AB
Tetrahydrocannabinol: NOT DETECTED

## 2013-04-05 LAB — COMPREHENSIVE METABOLIC PANEL
ALT: 10 U/L (ref 0–35)
AST: 12 U/L (ref 0–37)
Albumin: 4.4 g/dL (ref 3.5–5.2)
Alkaline Phosphatase: 144 U/L — ABNORMAL HIGH (ref 39–117)
BUN: 12 mg/dL (ref 6–23)
CO2: 15 mEq/L — ABNORMAL LOW (ref 19–32)
Calcium: 9.4 mg/dL (ref 8.4–10.5)
Chloride: 93 mEq/L — ABNORMAL LOW (ref 96–112)
Creatinine, Ser: 0.62 mg/dL (ref 0.50–1.10)
GFR calc Af Amer: >90 mL/min (ref >90)
GFR calc non Af Amer: >90 mL/min (ref >90)
Glucose, Bld: 532 mg/dL — ABNORMAL HIGH (ref 70–99)
Potassium: 4 mEq/L (ref 3.7–5.3)
Sodium: 136 mEq/L — ABNORMAL LOW (ref 137–147)
Total Bilirubin: 0.4 mg/dL (ref 0.3–1.2)
Total Protein: 8.2 g/dL (ref 6.0–8.3)

## 2013-04-05 LAB — GLUCOSE, CAPILLARY: Glucose-Capillary: 462 mg/dL — ABNORMAL HIGH (ref 70–99)

## 2013-04-05 LAB — LACTIC ACID, PLASMA: Lactic Acid, Venous: 4.8 mmol/L — ABNORMAL HIGH (ref 0.5–2.2)

## 2013-04-05 LAB — URINE MICROSCOPIC-ADD ON

## 2013-04-05 LAB — TROPONIN I: Troponin I: <0.3 ng/mL (ref <0.30)

## 2013-04-05 LAB — SALICYLATE LEVEL: Salicylate Lvl: <2 mg/dL — ABNORMAL LOW (ref 2.8–20.0)

## 2013-04-05 LAB — ETHANOL: Alcohol, Ethyl (B): <11 mg/dL (ref 0–11)

## 2013-04-05 LAB — ACETAMINOPHEN LEVEL: Acetaminophen (Tylenol), Serum: <15 ug/mL (ref 10–30)

## 2013-04-05 MED ORDER — LORAZEPAM 2 MG/ML IJ SOLN
1.0000 mg | Freq: Once | INTRAMUSCULAR | Status: AC
  Administered 2013-04-05: 2 mg via INTRAVENOUS

## 2013-04-05 MED ORDER — LIDOCAINE HCL (PF) 2 % IJ SOLN
10.0000 mL | Freq: Once | INTRAMUSCULAR | Status: AC
  Administered 2013-04-05: 10 mL via INTRADERMAL
  Filled 2013-04-05: qty 10

## 2013-04-05 MED ORDER — LORAZEPAM 2 MG/ML IJ SOLN
1.0000 mg | Freq: Once | INTRAMUSCULAR | Status: AC
  Administered 2013-04-05: 1 mg via INTRAVENOUS

## 2013-04-05 MED ORDER — SODIUM CHLORIDE 0.9 % IV SOLN
INTRAVENOUS | Status: DC
  Administered 2013-04-06: 3.6 [IU]/h via INTRAVENOUS
  Filled 2013-04-05: qty 1

## 2013-04-05 MED ORDER — PIPERACILLIN-TAZOBACTAM 3.375 G IVPB 30 MIN
3.3750 g | Freq: Once | INTRAVENOUS | Status: AC
  Administered 2013-04-05: 3.375 g via INTRAVENOUS
  Filled 2013-04-05 (×2): qty 50

## 2013-04-05 MED ORDER — LORAZEPAM 2 MG/ML IJ SOLN
INTRAMUSCULAR | Status: AC
  Filled 2013-04-05: qty 1

## 2013-04-05 MED ORDER — SODIUM CHLORIDE 0.9 % IV SOLN
1000.0000 mL | INTRAVENOUS | Status: DC
  Administered 2013-04-06: 1000 mL via INTRAVENOUS

## 2013-04-05 MED ORDER — POTASSIUM CHLORIDE 10 MEQ/100ML IV SOLN
10.0000 meq | INTRAVENOUS | Status: AC
  Administered 2013-04-05 – 2013-04-06 (×3): 10 meq via INTRAVENOUS
  Filled 2013-04-05 (×3): qty 100

## 2013-04-05 MED ORDER — MORPHINE SULFATE 4 MG/ML IJ SOLN
6.0000 mg | Freq: Once | INTRAMUSCULAR | Status: AC
  Administered 2013-04-05: 6 mg via INTRAMUSCULAR
  Filled 2013-04-05: qty 2

## 2013-04-05 MED ORDER — SODIUM CHLORIDE 0.9 % IV BOLUS (SEPSIS)
1000.0000 mL | Freq: Once | INTRAVENOUS | Status: AC
  Administered 2013-04-05: 1000 mL via INTRAVENOUS

## 2013-04-05 MED ORDER — HYDROCODONE-ACETAMINOPHEN 5-325 MG PO TABS: 2.0000 | ORAL_TABLET | ORAL | Status: DC | PRN

## 2013-04-05 MED ORDER — SODIUM CHLORIDE 0.9 % IV BOLUS (SEPSIS)
1000.0000 mL | Freq: Once | INTRAVENOUS | Status: AC
  Administered 2013-04-06: 1000 mL via INTRAVENOUS

## 2013-04-05 MED ORDER — VANCOMYCIN HCL IN DEXTROSE 1-5 GM/200ML-% IV SOLN
1000.0000 mg | Freq: Once | INTRAVENOUS | Status: AC
  Administered 2013-04-06: 1000 mg via INTRAVENOUS
  Filled 2013-04-05: qty 200

## 2013-04-05 MED ORDER — LORAZEPAM 2 MG/ML IJ SOLN
INTRAMUSCULAR | Status: AC
  Administered 2013-04-05: 2 mg via INTRAVENOUS
  Filled 2013-04-05: qty 1

## 2013-04-05 NOTE — ED Notes (Signed)
Patient seen in this ER today for Bartholin gland abcess.  Given MSO4 prior to d/c.  Approximately 30 minutes after return home, family reported patient became confused, urinating in floor.  Upon EMS arrival, patient combative.  Pulled IV out, cursing, disoriented.  CBG on site read "HIGH"

## 2013-04-05 NOTE — ED Notes (Signed)
Husband here, states pt started acting "strange" around 7pm, confused, "jibber jabber" speech, called 911after she sat on floor and he could not get her up. Does not describe a seizure episode.

## 2013-04-05 NOTE — Telephone Encounter (Signed)
Close Encounter 

## 2013-04-05 NOTE — ED Notes (Signed)
Pt reports has abscess on vagina for over 1 week.

## 2013-04-05 NOTE — ED Provider Notes (Signed)
CSN: [id number]     Arrival date & time 04/05/13  1415 History   This chart was scribed for Enid Skeens, MD, by Yevette Edwards, ED Scribe. This patient was seen in room APA03/[facility]03 and the patient's care was started at 3:51 PM. First MD Initiated Contact with Patient 04/05/13 1539     Chief Complaint  Patient presents with  . Abscess    The history is provided by the patient. No language interpreter was used.   HPI Comments: [name] is a 62 y.o. female, with a h/o DM and HTN, who presents to the Emergency Department complaining of a suspected abscess to the right lower labia which has been present for approximately a week. She reports pain associated with the site. The pt has a h/o a Bartholian cyst, and she reports the current symptoms are similar to the previous Bartholin cyst. She denies vaginal discharge. She denies a h/o MRSA. The pt is a non-smoker.   Past Medical History  Diagnosis Date  . Hyperlipidemia   . Hypertension   . Diabetes mellitus   . Asthma   . 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 Hillis Range, residual palpitations  . Vocal cord dysfunction   . Esophageal reflux   . Tremor, essential     Neurontin 600 mg qhs x 1 yr--no help.  Inderal prior to this was helpful for 10 yrs then stopped working.  . Anxiety   . Allergic rhinitis   . Low sodium syndrome   . Depression    Past Surgical History  Procedure Laterality Date  . Tonsillectomy    . Right breast cyst      benign  . Left ankle ligament repair    . Left elbow repair    . Partial hysterectomy    . Cholecystectomy    . Cesarean section    . Ablation for avnrt    . Colonoscopy  01/16/2004    WGN:FAOZHY rectum/colon  . Esophagogastroduodenoscopy (egd) with esophageal dilation  04/03/2002    QMV:HQIONGEX'B ring, otherwise normal esophagus, status post dilation with 56 F/Normal stomach  . Bartholin gland cyst excision    . Esophagogastroduodenoscopy (egd)  with esophageal dilation N/A 11/08/2012    Procedure: ESOPHAGOGASTRODUODENOSCOPY (EGD) WITH ESOPHAGEAL DILATION;  Surgeon: Corbin Ade, MD;  Location: [facility];  Service: Endoscopy;  Laterality: N/A;  11:30  . Abdominal hysterectomy     Family History  Problem Relation Age of Onset  . Lung cancer Father     DIED AGE 33 LUNG CA  . Alcohol abuse Father   . Anxiety disorder Father   . Depression Father   . COPD Mother     DIED AGE 37,MRSA,COPD,PNEUMONIA  . Pneumonia Mother     DIED AGE 37,MRSA,COPD,PNEUMONIA  . Supraventricular tachycardia Mother   . Anxiety disorder Mother   . Depression Mother   . Alcohol abuse Mother   . COPD Brother     AGE 84  . Heart disease Sister     DIED AGE 18, SMOKER,?HEART  . Colon cancer Neg Hx    History  Substance Use Topics  . Smoking status: Former Smoker -- 0.30 packs/day for 10 years    Types: Cigarettes    Quit date: 04/06/1985  . Smokeless tobacco: Never Used  . Alcohol Use: No   No OB history provided.  Review of Systems  Constitutional: Negative for fever and chills.  HENT: Negative for sore  throat.   Respiratory: Positive for cough.   Gastrointestinal: Negative for nausea, vomiting and abdominal pain.  Genitourinary: Negative for vaginal discharge.  Skin: Positive for wound. Negative for rash.    Allergies  Metformin and related; Guaifenesin; Oxycodone-acetaminophen; and Guaifenesin er  Home Medications   Current Outpatient Rx  Name  Route  Sig  Dispense  Refill  . albuterol (PROAIR HFA) 108 (90 BASE) MCG/ACT inhaler   Inhalation   Inhale 2 puffs into the lungs every 6 (six) hours as needed for wheezing.         Marland Kitchen albuterol (PROVENTIL) (2.5 MG/3ML) 0.083% nebulizer solution   Nebulization   Take 2.5 mg by nebulization every 6 (six) hours as needed for wheezing or shortness of breath.         Marland Kitchen aspirin EC 81 MG tablet   Oral   Take 81 mg by mouth every morning.          . cyclobenzaprine (FLEXERIL) 10 MG  tablet   Oral   Take 0.5 tablets (5 mg total) by mouth 3 (three) times daily as needed for muscle spasms.   30 tablet   1   . DULoxetine (CYMBALTA) 60 MG capsule   Oral   Take 1 capsule (60 mg total) by mouth daily.   30 capsule   2   . fluticasone-salmeterol (ADVAIR HFA) 115-21 MCG/ACT inhaler      2 puffs then rinse mouth, twice daily   1 Inhaler   prn   . Insulin Detemir 100 UNIT/ML SOPN   Subcutaneous   Inject 180 Units into the skin every morning. And pen needles 1/day   20 pen   11   . levothyroxine (SYNTHROID, LEVOTHROID) 75 MCG tablet      TAKE ONE TABLET BY MOUTH BEFORE BREAKFAST   30 tablet   6   . LORazepam (ATIVAN) 1 MG tablet   Oral   Take 1 tablet (1 mg total) by mouth 3 (three) times daily.   90 tablet   2   . losartan (COZAAR) 50 MG tablet   Oral   Take 1 tablet (50 mg total) by mouth daily.   30 tablet   6   . metoprolol succinate (TOPROL-XL) 50 MG 24 hr tablet   Oral   Take 50 mg by mouth 2 (two) times daily. Take with or immediately following a meal.         . mometasone (NASONEX) 50 MCG/ACT nasal spray   Nasal   Place 2 sprays into the nose every morning.          . naproxen (NAPROSYN) 500 MG tablet   Oral   Take 1 tablet (500 mg total) by mouth 2 (two) times daily with a meal.   60 tablet   3   . olmesartan (BENICAR) 40 MG tablet   Oral   Take 1 tablet (40 mg total) by mouth daily.   90 tablet   2   . primidone (MYSOLINE) 50 MG tablet   Oral   Take 150 mg by mouth 2 (two) times daily.         . Pseudoeph-Doxylamine-DM-APAP (NYQUIL PO)   Oral   Take 5 mLs by mouth at bedtime as needed (sinus drainage).         . sertraline (ZOLOFT) 100 MG tablet      Take and one and one half tablets at bedtime   45 tablet   3   . Spacer/Aero-Holding Chambers (AEROCHAMBER  MV) inhaler      Use as instructed   1 each   0    There were no vitals taken for this visit. Physical Exam  Nursing note and vitals  reviewed. Constitutional: She is oriented to person, place, and time. She appears well-developed and well-nourished. No distress.  HENT:  Head: Normocephalic and atraumatic.  Eyes: EOM are normal.  Neck: Neck supple. No tracheal deviation present.  Cardiovascular: Normal rate, regular rhythm and normal heart sounds.   Pulmonary/Chest: Effort normal and breath sounds normal. No respiratory distress.  Abdominal: Soft. There is no tenderness.  Genitourinary:  Mild swelling and induration of right lower labia consistent with a Bartholin cyst. Mild erythema. No discharge.   Musculoskeletal: Normal range of motion.  Neurological: She is alert and oriented to person, place, and time.  Skin: Skin is warm and dry.  Psychiatric: She has a normal mood and affect. Her behavior is normal.    ED Course  Procedures (including critical care time) INCISION AND DRAINAGE Performed by: Enid Skeens Consent: Verbal consent obtained. Risks and benefits: risks, benefits and alternatives were discussed Type: abscess  Body area: right labia majora, bartholins abscess Anesthesia: local infiltration Incision was made with a scalpel. Local anesthetic: lidocaine Anesthetic total: 5 ml Complexity: complex Blunt dissection to break up loculations Drainage: 5 cc purulence, word catheter placed  Patient tolerance: Patient tolerated the procedure well with no immediate complications.     DIAGNOSTIC STUDIES: Oxygen Saturation is 99% on RA, normal by my interpretation.    COORDINATION OF CARE:  3:56 PM- Discussed treatment plan with patient, and the patient agreed to the plan.   Labs Review Labs Reviewed - No data to display Imaging Review No results found.  EKG Interpretation   None       MDM  No diagnosis found.   I personally performed the services described in this documentation, which was scribed in my presence. The recorded information has been reviewed and is  accurate.  Bartholins abscess.  No fevers or systemic sxs. I and D with drain in ED. Irrigated in ED.  Discussed fup with GYN, pt agrees.   Pain meds in ED and for home.  Results and differential diagnosis were discussed with the patient. Close follow up outpatient was discussed, patient comfortable with the plan.   Diagnosis: Bartholins abscess    Enid Skeens, MD 04/05/13 2137

## 2013-04-05 NOTE — ED Provider Notes (Signed)
CSN: [id number]     Arrival date & time 04/05/13  2154 History   First MD Initiated Contact with Patient 04/05/13 2226     Chief Complaint  Patient presents with  . Altered Mental Status  . Hyperglycemia   (Consider location/radiation/quality/duration/timing/severity/associated sxs/prior Treatment) HPI  62 year old female brought in by EMS with encephalopathy. Patient was seen in emergency room earlier today and had an incision and drainage of a Bartholin cyst. She was discharged in her usual state of health. Her husband satets she began to act confused shortly after getting home. They're were on their way to a function at church for the [facility] when she began getting very agitated in the car. He brought her back home and the behavior continued to worsen until she eventually fell and he could not get her back up. She did not voice any specific complaints to him. No ingestions that he is aware of. Currently she is extremely confused and combative and unable to provide any useful history.   Past Medical History  Diagnosis Date  . Hyperlipidemia   . Hypertension   . Diabetes mellitus   . Asthma   . 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 Hillis Range, residual palpitations  . Vocal cord dysfunction   . Esophageal reflux   . Tremor, essential     Neurontin 600 mg qhs x 1 yr--no help.  Inderal prior to this was helpful for 10 yrs then stopped working.  . Anxiety   . Allergic rhinitis   . Low sodium syndrome   . Depression    Past Surgical History  Procedure Laterality Date  . Tonsillectomy    . Right breast cyst      benign  . Left ankle ligament repair    . Left elbow repair    . Partial hysterectomy    . Cholecystectomy    . Cesarean section    . Ablation for avnrt    . Colonoscopy  01/16/2004    WUJ:WJXBJY rectum/colon  . Esophagogastroduodenoscopy (egd) with esophageal dilation  04/03/2002    NWG:NFAOZHYQ'M ring, otherwise normal  esophagus, status post dilation with 56 F/Normal stomach  . Bartholin gland cyst excision    . Esophagogastroduodenoscopy (egd) with esophageal dilation N/A 11/08/2012    Procedure: ESOPHAGOGASTRODUODENOSCOPY (EGD) WITH ESOPHAGEAL DILATION;  Surgeon: Corbin Ade, MD;  Location: [facility];  Service: Endoscopy;  Laterality: N/A;  11:30  . Abdominal hysterectomy     Family History  Problem Relation Age of Onset  . Lung cancer Father     DIED AGE 68 LUNG CA  . Alcohol abuse Father   . Anxiety disorder Father   . Depression Father   . COPD Mother     DIED AGE 79,MRSA,COPD,PNEUMONIA  . Pneumonia Mother     DIED AGE 79,MRSA,COPD,PNEUMONIA  . Supraventricular tachycardia Mother   . Anxiety disorder Mother   . Depression Mother   . Alcohol abuse Mother   . COPD Brother     AGE 45  . Heart disease Sister     DIED AGE 28, SMOKER,?HEART  . Colon cancer Neg Hx    History  Substance Use Topics  . Smoking status: Former Smoker -- 0.30 packs/day for 10 years    Types: Cigarettes    Quit date: 04/06/1985  . Smokeless tobacco: Never Used  . Alcohol Use: No   OB History   Grav Para Term Preterm Abortions TAB SAB Ect Mult  Living                 Review of Systems Level 5 caveat applies because pt encephalopathic.   Allergies  Metformin and related; Guaifenesin; Oxycodone-acetaminophen; and Guaifenesin er  Home Medications   Current Outpatient Rx  Name  Route  Sig  Dispense  Refill  . albuterol (PROAIR HFA) 108 (90 BASE) MCG/ACT inhaler   Inhalation   Inhale 2 puffs into the lungs every 6 (six) hours as needed for wheezing.         Marland Kitchen albuterol (PROVENTIL) (2.5 MG/3ML) 0.083% nebulizer solution   Nebulization   Take 2.5 mg by nebulization every 6 (six) hours as needed for wheezing or shortness of breath.         Marland Kitchen aspirin EC 81 MG tablet   Oral   Take 81 mg by mouth every morning.          . cyclobenzaprine (FLEXERIL) 10 MG tablet   Oral   Take 0.5 tablets (5 mg  total) by mouth 3 (three) times daily as needed for muscle spasms.   30 tablet   1   . DULoxetine (CYMBALTA) 60 MG capsule   Oral   Take 1 capsule (60 mg total) by mouth daily.   30 capsule   2   . HYDROcodone-acetaminophen (NORCO) 5-325 MG per tablet   Oral   Take 2 tablets by mouth every 4 (four) hours as needed.   10 tablet   0   . Insulin Detemir 100 UNIT/ML SOPN   Subcutaneous   Inject 180 Units into the skin every morning. And pen needles 1/day   20 pen   11   . levothyroxine (SYNTHROID, LEVOTHROID) 75 MCG tablet   Oral   Take 75 mcg by mouth daily before breakfast.         . LORazepam (ATIVAN) 1 MG tablet   Oral   Take 1 tablet (1 mg total) by mouth 3 (three) times daily.   90 tablet   2   . losartan (COZAAR) 50 MG tablet   Oral   Take 1 tablet (50 mg total) by mouth daily.   30 tablet   6   . metoprolol succinate (TOPROL-XL) 50 MG 24 hr tablet   Oral   Take 50 mg by mouth 2 (two) times daily. Take with or immediately following a meal.         . mometasone (NASONEX) 50 MCG/ACT nasal spray   Nasal   Place 2 sprays into the nose every morning.          . olmesartan (BENICAR) 40 MG tablet   Oral   Take 1 tablet (40 mg total) by mouth daily.   90 tablet   2   . primidone (MYSOLINE) 50 MG tablet   Oral   Take 150 mg by mouth 2 (two) times daily.         . Pseudoeph-Doxylamine-DM-APAP (NYQUIL PO)   Oral   Take 5 mLs by mouth at bedtime as needed (sinus drainage).         . sertraline (ZOLOFT) 100 MG tablet   Oral   Take 150 mg by mouth daily.         Marland Kitchen Spacer/Aero-Holding Chambers (AEROCHAMBER MV) inhaler      Use as instructed   1 each   0    BP 163/82  Pulse 139  Resp 17  SpO2 97% Physical Exam  Nursing note and vitals reviewed. Constitutional:  She appears distressed.  The patient is very confused and combative. Swearing and requiring physical restraint.   HENT:  Head: Normocephalic and atraumatic.  Dried blood noted at  the lips and on the teeth. No source of the blood was identified but could not visualize entire oropharynx because of pt's attempts to bite. Dry mucus membranes.   Eyes: Conjunctivae are normal. Right eye exhibits no discharge. Left eye exhibits no discharge.  Neck: Neck supple.  Cardiovascular: Regular rhythm and normal heart sounds.  Exam reveals no gallop and no friction rub.   No murmur heard. tachycardia  Pulmonary/Chest: Effort normal and breath sounds normal. No respiratory distress.  Abdominal: Soft. She exhibits no distension. There is no tenderness.  Obese.   Genitourinary:  Small wound to inferior aspect of the right labia majora consistent with recent incision and drainage. I did not visualize a word catheter.   Musculoskeletal: She exhibits no edema and no tenderness.  Neurological:  Confused. Generalized shaking, most noticeable in upper extremities.  Not following commands. Purposeful movements. Moving all extremities.   Skin: Skin is warm and dry.    ED Course  Procedures (including critical care time)  CRITICAL CARE Performed by: Raeford Razor  Total critical care time: 35 minutes  Critical care time was exclusive of separately billable procedures and treating other patients. Critical care was necessary to treat or prevent imminent or life-threatening deterioration. Critical care was time spent personally by me on the following activities: development of treatment plan with patient and/or surrogate as well as nursing, discussions with consultants, evaluation of patient's response to treatment, examination of patient, obtaining history from patient or surrogate, ordering and performing treatments and interventions, ordering and review of laboratory studies, ordering and review of radiographic studies, pulse oximetry and re-evaluation of patient's condition.   Labs Review Labs Reviewed  COMPREHENSIVE METABOLIC PANEL - Abnormal; Notable for the following:    Sodium 136  (*)    Chloride 93 (*)    CO2 15 (*)    Glucose, Bld 532 (*)    Alkaline Phosphatase 144 (*)    All other components within normal limits  GLUCOSE, CAPILLARY - Abnormal; Notable for the following:    Glucose-Capillary 462 (*)    All other components within normal limits  CBC WITH DIFFERENTIAL - Abnormal; Notable for the following:    WBC 15.0 (*)    Neutrophils Relative % 83 (*)    Neutro Abs 12.5 (*)    All other components within normal limits  URINALYSIS, ROUTINE W REFLEX MICROSCOPIC - Abnormal; Notable for the following:    Specific Gravity, Urine >1.030 (*)    Glucose, UA >1000 (*)    Hgb urine dipstick SMALL (*)    Ketones, ur >80 (*)    Protein, ur 100 (*)    All other components within normal limits  BLOOD GAS, ARTERIAL - Abnormal; Notable for the following:    pH, Arterial 7.300 (*)    pCO2 arterial 30.0 (*)    Bicarbonate 14.3 (*)    Acid-base deficit 10.8 (*)    All other components within normal limits  URINE RAPID DRUG SCREEN (HOSP PERFORMED) - Abnormal; Notable for the following:    Opiates POSITIVE (*)    Barbiturates POSITIVE (*)    All other components within normal limits  SALICYLATE LEVEL - Abnormal; Notable for the following:    Salicylate Lvl <2.0 (*)    All other components within normal limits  URINE MICROSCOPIC-ADD ON - Abnormal; Notable  for the following:    Bacteria, UA FEW (*)    All other components within normal limits  LACTIC ACID, PLASMA - Abnormal; Notable for the following:    Lactic Acid, Venous 4.8 (*)    All other components within normal limits  GLUCOSE, CAPILLARY - Abnormal; Notable for the following:    Glucose-Capillary 416 (*)    All other components within normal limits  CULTURE, BLOOD (ROUTINE X 2)  CULTURE, BLOOD (ROUTINE X 2)  MAGNESIUM  ETHANOL  ACETAMINOPHEN LEVEL  TROPONIN I   Imaging Review Ct Head Wo Contrast  04/05/2013   CLINICAL DATA:  Seizure.  Altered mental status.  Hyperglycemia.  EXAM: CT HEAD WITHOUT  CONTRAST  TECHNIQUE: Contiguous axial images were obtained from the base of the skull through the vertex without intravenous contrast.  COMPARISON:  08/16/2003  FINDINGS: Sinuses/Soft tissues: Motion degradation, most marked on the 1st attempt. Clear paranasal sinuses and mastoid air cells.  Intracranial: Mild cerebral atrophy for age. No mass lesion, hemorrhage, hydrocephalus, acute infarct, intra-axial, or extra-axial fluid collection.  IMPRESSION: 1. Motion degraded exam. 2. Given this factor, no acute intracranial abnormality. 3. Cerebral atrophy.   Electronically Signed   By: Jeronimo Greaves M.D.   On: 04/05/2013 23:15   EKG:  Rhythm: sinus tach Vent. rate 122 BPM PR interval 148 ms QRS duration 80 ms QT/QTc 328/467 ms ST segments: nsst   EKG Interpretation   None       MDM   1. DKA (diabetic ketoacidoses)    62 year old female with altered metal status. Workup is consistent for diabetic ketoacidosis. Patient with severe generalized tremor. Her husband states she has a baseline tremor similar to this but not quite this pronounced. May be exacerbated by current illness. She is encephalopathic, but purposeful. I do not think that she is seizing. Fluid resuscitation began. Insulin gtt. Blood cultures/abx with severity of illness and just had I&D of abscess. Infection likely percipitant. Needs admit.     Raeford Razor, MD 04/06/13 [phone]

## 2013-04-06 ENCOUNTER — Inpatient Hospital Stay (HOSPITAL_COMMUNITY): Payer: BC Managed Care – PPO

## 2013-04-06 DIAGNOSIS — N750 Cyst of Bartholin's gland: Secondary | ICD-10-CM

## 2013-04-06 DIAGNOSIS — I10 Essential (primary) hypertension: Secondary | ICD-10-CM

## 2013-04-06 DIAGNOSIS — E1110 Type 2 diabetes mellitus with ketoacidosis without coma: Secondary | ICD-10-CM | POA: Diagnosis present

## 2013-04-06 DIAGNOSIS — G9340 Encephalopathy, unspecified: Secondary | ICD-10-CM

## 2013-04-06 DIAGNOSIS — E039 Hypothyroidism, unspecified: Secondary | ICD-10-CM

## 2013-04-06 LAB — BASIC METABOLIC PANEL
BUN: 7 mg/dL (ref 6–23)
BUN: 6 mg/dL (ref 6–23)
BUN: 7 mg/dL (ref 6–23)
BUN: 7 mg/dL (ref 6–23)
CO2: 16 mEq/L — ABNORMAL LOW (ref 19–32)
CO2: 18 mEq/L — ABNORMAL LOW (ref 19–32)
CO2: 19 mEq/L (ref 19–32)
CO2: 19 mEq/L (ref 19–32)
Calcium: 8.4 mg/dL (ref 8.4–10.5)
Calcium: 8.4 mg/dL (ref 8.4–10.5)
Calcium: 8.5 mg/dL (ref 8.4–10.5)
Calcium: 8.6 mg/dL (ref 8.4–10.5)
Chloride: 103 mEq/L (ref 96–112)
Chloride: 102 mEq/L (ref 96–112)
Chloride: 109 mEq/L (ref 96–112)
Chloride: 109 mEq/L (ref 96–112)
Creatinine, Ser: 0.54 mg/dL (ref 0.50–1.10)
Creatinine, Ser: 0.53 mg/dL (ref 0.50–1.10)
Creatinine, Ser: 0.67 mg/dL (ref 0.50–1.10)
Creatinine, Ser: 0.68 mg/dL (ref 0.50–1.10)
GFR calc Af Amer: >90 mL/min (ref >90)
GFR calc Af Amer: >90 mL/min (ref >90)
GFR calc Af Amer: >90 mL/min (ref >90)
GFR calc Af Amer: >90 mL/min (ref >90)
GFR calc non Af Amer: >90 mL/min (ref >90)
GFR calc non Af Amer: >90 mL/min (ref >90)
GFR calc non Af Amer: >90 mL/min (ref >90)
GFR calc non Af Amer: >90 mL/min (ref >90)
Glucose, Bld: 253 mg/dL — ABNORMAL HIGH (ref 70–99)
Glucose, Bld: 296 mg/dL — ABNORMAL HIGH (ref 70–99)
Glucose, Bld: 143 mg/dL — ABNORMAL HIGH (ref 70–99)
Glucose, Bld: 147 mg/dL — ABNORMAL HIGH (ref 70–99)
Potassium: 3.9 mEq/L (ref 3.7–5.3)
Potassium: 3.8 mEq/L (ref 3.7–5.3)
Potassium: 3.3 mEq/L — ABNORMAL LOW (ref 3.7–5.3)
Potassium: 3.1 mEq/L — ABNORMAL LOW (ref 3.7–5.3)
Sodium: 139 mEq/L (ref 137–147)
Sodium: 137 mEq/L (ref 137–147)
Sodium: 141 mEq/L (ref 137–147)
Sodium: 140 mEq/L (ref 137–147)

## 2013-04-06 LAB — GLUCOSE, CAPILLARY
Glucose-Capillary: 130 mg/dL — ABNORMAL HIGH (ref 70–99)
Glucose-Capillary: 239 mg/dL — ABNORMAL HIGH (ref 70–99)
Glucose-Capillary: 244 mg/dL — ABNORMAL HIGH (ref 70–99)
Glucose-Capillary: 284 mg/dL — ABNORMAL HIGH (ref 70–99)
Glucose-Capillary: 292 mg/dL — ABNORMAL HIGH (ref 70–99)
Glucose-Capillary: 309 mg/dL — ABNORMAL HIGH (ref 70–99)
Glucose-Capillary: 416 mg/dL — ABNORMAL HIGH (ref 70–99)
Glucose-Capillary: 434 mg/dL — ABNORMAL HIGH (ref 70–99)

## 2013-04-06 LAB — CBC
HCT: 37.3 % (ref 36.0–46.0)
Hemoglobin: 13.1 g/dL (ref 12.0–15.0)
MCH: 30.4 pg (ref 26.0–34.0)
MCHC: 35.1 g/dL (ref 30.0–36.0)
MCV: 86.5 fL (ref 78.0–100.0)
Platelets: 202 10*3/uL (ref 150–400)
RBC: 4.31 MIL/uL (ref 3.87–5.11)
RDW: 12.7 % (ref 11.5–15.5)
WBC: 14.1 10*3/uL — ABNORMAL HIGH (ref 4.0–10.5)

## 2013-04-06 LAB — TSH: TSH: 1.618 u[IU]/mL (ref 0.350–4.500)

## 2013-04-06 LAB — RPR: RPR Ser Ql: NONREACTIVE

## 2013-04-06 LAB — MRSA PCR SCREENING: MRSA by PCR: POSITIVE — AB

## 2013-04-06 LAB — PROCALCITONIN: Procalcitonin: <0.1 ng/mL

## 2013-04-06 LAB — VITAMIN B12: Vitamin B-12: 416 pg/mL (ref 211–911)

## 2013-04-06 MED ORDER — DEXTROSE 50 % IV SOLN: 25.0000 mL | INTRAVENOUS | Status: DC | PRN

## 2013-04-06 MED ORDER — INSULIN REGULAR HUMAN 100 UNIT/ML IJ SOLN
INTRAMUSCULAR | Status: DC
  Administered 2013-04-06: 4.3 [IU]/h via INTRAVENOUS
  Filled 2013-04-06: qty 1

## 2013-04-06 MED ORDER — HALOPERIDOL LACTATE 5 MG/ML IJ SOLN
INTRAMUSCULAR | Status: AC
  Filled 2013-04-06: qty 1

## 2013-04-06 MED ORDER — HALOPERIDOL LACTATE 5 MG/ML IJ SOLN
INTRAMUSCULAR | Status: AC
  Administered 2013-04-06: 5 mg via INTRAMUSCULAR
  Filled 2013-04-06: qty 1

## 2013-04-06 MED ORDER — HEPARIN SODIUM (PORCINE) 5000 UNIT/ML IJ SOLN
5000.0000 [IU] | Freq: Three times a day (TID) | INTRAMUSCULAR | Status: DC
  Administered 2013-04-06 – 2013-04-12 (×19): 5000 [IU] via SUBCUTANEOUS
  Filled 2013-04-06 (×21): qty 1

## 2013-04-06 MED ORDER — SODIUM CHLORIDE 0.9 % IV SOLN: INTRAVENOUS | Status: DC

## 2013-04-06 MED ORDER — VANCOMYCIN HCL 10 G IV SOLR: 1250.0000 mg | Freq: Two times a day (BID) | INTRAVENOUS | Status: DC

## 2013-04-06 MED ORDER — CHLORHEXIDINE GLUCONATE CLOTH 2 % EX PADS
6.0000 | MEDICATED_PAD | Freq: Every day | CUTANEOUS | Status: AC
  Administered 2013-04-06 – 2013-04-10 (×5): 6 via TOPICAL

## 2013-04-06 MED ORDER — DEXTROSE-NACL 5-0.45 % IV SOLN
INTRAVENOUS | Status: DC
  Administered 2013-04-06 – 2013-04-07 (×2): via INTRAVENOUS

## 2013-04-06 MED ORDER — SODIUM CHLORIDE 0.9 % IV SOLN
INTRAVENOUS | Status: DC
  Administered 2013-04-06: 04:00:00 via INTRAVENOUS

## 2013-04-06 MED ORDER — MUPIROCIN 2 % EX OINT
1.0000 "application " | TOPICAL_OINTMENT | Freq: Two times a day (BID) | CUTANEOUS | Status: AC
  Administered 2013-04-06 – 2013-04-10 (×10): 1 via NASAL
  Filled 2013-04-06 (×2): qty 22

## 2013-04-06 MED ORDER — PIPERACILLIN-TAZOBACTAM 3.375 G IVPB
3.3750 g | Freq: Three times a day (TID) | INTRAVENOUS | Status: DC
Start: 2013-04-06 — End: 2013-04-10
  Administered 2013-04-06 – 2013-04-10 (×13): 3.375 g via INTRAVENOUS
  Filled 2013-04-06 (×17): qty 50

## 2013-04-06 MED ORDER — LABETALOL HCL 5 MG/ML IV SOLN
10.0000 mg | INTRAVENOUS | Status: DC | PRN
  Administered 2013-04-06 – 2013-04-09 (×4): 10 mg via INTRAVENOUS
  Filled 2013-04-06 (×4): qty 4

## 2013-04-06 MED ORDER — VANCOMYCIN HCL 10 G IV SOLR
1250.0000 mg | Freq: Two times a day (BID) | INTRAVENOUS | Status: DC
  Administered 2013-04-06 – 2013-04-09 (×6): 1250 mg via INTRAVENOUS
  Filled 2013-04-06 (×7): qty 1250

## 2013-04-06 MED ORDER — ALBUTEROL SULFATE (2.5 MG/3ML) 0.083% IN NEBU: 2.5000 mg | INHALATION_SOLUTION | RESPIRATORY_TRACT | Status: DC | PRN

## 2013-04-06 MED ORDER — LEVOTHYROXINE SODIUM 100 MCG IV SOLR
37.5000 ug | Freq: Every day | INTRAVENOUS | Status: DC
  Administered 2013-04-06 – 2013-04-08 (×3): 37.5 ug via INTRAVENOUS
  Filled 2013-04-06 (×3): qty 5

## 2013-04-06 MED ORDER — DEXMEDETOMIDINE HCL IN NACL 200 MCG/50ML IV SOLN
0.4000 ug/kg/h | INTRAVENOUS | Status: DC
  Administered 2013-04-06: 0.7 ug/kg/h via INTRAVENOUS
  Administered 2013-04-06: 1 ug/kg/h via INTRAVENOUS
  Administered 2013-04-06: 0.7 ug/kg/h via INTRAVENOUS
  Administered 2013-04-06: 1 ug/kg/h via INTRAVENOUS
  Administered 2013-04-06: 0.2 ug/kg/h via INTRAVENOUS
  Administered 2013-04-07: 1.2 ug/kg/h via INTRAVENOUS
  Filled 2013-04-06 (×4): qty 50
  Filled 2013-04-06: qty 100
  Filled 2013-04-06 (×3): qty 50

## 2013-04-06 MED ORDER — HALOPERIDOL LACTATE 5 MG/ML IJ SOLN
5.0000 mg | Freq: Once | INTRAMUSCULAR | Status: AC
  Administered 2013-04-06: 5 mg via INTRAVENOUS

## 2013-04-06 MED ORDER — SODIUM CHLORIDE 0.9 % IV SOLN: INTRAVENOUS | Status: AC

## 2013-04-06 MED ORDER — HALOPERIDOL LACTATE 5 MG/ML IJ SOLN
5.0000 mg | Freq: Once | INTRAMUSCULAR | Status: AC
  Administered 2013-04-06: 5 mg via INTRAMUSCULAR

## 2013-04-06 MED ORDER — POTASSIUM CHLORIDE 10 MEQ/100ML IV SOLN
10.0000 meq | INTRAVENOUS | Status: AC
  Administered 2013-04-06 (×4): 10 meq via INTRAVENOUS
  Filled 2013-04-06: qty 100

## 2013-04-06 MED ORDER — SODIUM CHLORIDE 0.9 % IV SOLN
1250.0000 mg | Freq: Two times a day (BID) | INTRAVENOUS | Status: DC
  Administered 2013-04-06: 1250 mg via INTRAVENOUS
  Filled 2013-04-06 (×2): qty 1250

## 2013-04-06 MED ORDER — ALBUTEROL SULFATE (2.5 MG/3ML) 0.083% IN NEBU: 2.5000 mg | INHALATION_SOLUTION | Freq: Four times a day (QID) | RESPIRATORY_TRACT | Status: DC | PRN

## 2013-04-06 MED ORDER — LABETALOL HCL 5 MG/ML IV SOLN
INTRAVENOUS | Status: AC
  Filled 2013-04-06: qty 4

## 2013-04-06 NOTE — Progress Notes (Signed)
Inpatient Diabetes Program Recommendations  AACE/ADA: New Consensus Statement on Inpatient Glycemic Control (2013)  Target Ranges:  Prepandial:   less than 140 mg/dL      Peak postprandial:   less than 180 mg/dL (1-2 hours)      Critically ill patients:  140 - 180 mg/dL   Reason for Visit: DKA  Results for [name], [name] (MRN [id number]) as of 04/06/2013 18:56  Ref. Range 04/06/2013 03:06 04/06/2013 05:06 04/06/2013 06:12 04/06/2013 07:29 04/06/2013 12:48  Glucose-Capillary Latest Range: 70-99 mg/dL 292 (H) 244 (H) 239 (H) 284 (H) 130 (H)  Results for [name], [name] (MRN [id number]) as of 04/06/2013 18:56  Ref. Range 04/06/2013 05:35 04/06/2013 07:30 04/06/2013 14:01  Sodium Latest Range: 137-147 mEq/L 139 137 141  Potassium Latest Range: 3.7-5.3 mEq/L 3.9 3.8 3.3 (L)  Chloride Latest Range: 96-112 mEq/L 103 102 109  CO2 Latest Range: 19-32 mEq/L 16 (L) 18 (L) 19  BUN Latest Range: 6-23 mg/dL 7 6 7   Creatinine Latest Range: 0.50-1.10 mg/dL 0.54 0.53 0.67  Calcium Latest Range: 8.4-10.5 mg/dL 8.4 8.4 8.5  GFR calc non Af Amer Latest Range: >90 mL/min >90 >90 >90  GFR calc Af Amer Latest Range: >90 mL/min >90 >90 >90  Glucose Latest Range: 70-99 mg/dL 253 (H) 296 (H) 143 (H)    Inpatient Diabetes Program Recommendations Insulin - Basal: When ready to transition to SQ insulin, give Lantus 12 units (.15units/kg) at least 1 hour prior to discontinuation of insulin drip Correction (SSI): Novolog moderate correction as soon as IV insulin stopped, then Q4 hours HgbA1C: Need HgbA1C to assess glycemic control prior to hospitalization Diet: When advanced, CHO mod med  Note: Will follow while inpatient. Thank you. Lorenda Peck, RD, LDN, CDE Inpatient Diabetes Coordinator [phone]

## 2013-04-06 NOTE — Progress Notes (Signed)
ANTIBIOTIC CONSULT NOTE - INITIAL  Pharmacy Consult for Vancomycin and Zosyn  Indication: rule out sepsis  Allergies  Allergen Reactions  . Metformin And Related     diarrhea  . Guaifenesin     MUCINEX REACTION: asthma attacks  . Oxycodone-Acetaminophen     TYLOX.  . Guaifenesin Er     Patient Measurements: Height: 5\' 7"  (170.2 cm) Weight: 186 lb 11.7 oz (84.7 kg) IBW/kg (Calculated) : 61.6  Vital Signs: Temp: 98.7 F (37.1 C) (01/01 0313) Temp src: Oral (01/01 0313) BP: 200/131 mmHg (01/01 0345) Pulse Rate: 133 (01/01 0345) Intake/Output from previous day: 12/31 0701 - 01/01 0700 In: 150 [I.V.:150] Out: 2200 [Urine:2200] Intake/Output from this shift: Total I/O In: 150 [I.V.:150] Out: 2200 [Urine:2200]  Labs:  Recent Labs  04/05/13 2200 04/05/13 2225  WBC  --  15.0*  HGB  --  14.5  PLT  --  237  CREATININE 0.62  --    Estimated Creatinine Clearance: 79.4 ml/min (by C-G formula based on Cr of 0.62). No results found for this basename: VANCOTROUGH, Corlis Leak, VANCORANDOM, [place], [place], [place], [place], TOBRAPEAK, TOBRARND, AMIKACINPEAK, AMIKACINTROU, AMIKACIN,  in the last 72 hours   Microbiology: Recent Results (from the past 720 hour(s))  CULTURE, BLOOD (ROUTINE X 2)     Status: None   Collection Time    04/05/13 11:15 PM      Result Value Range Status   Specimen Description Blood BLOOD LEFT HAND   Final   Special Requests BOTTLES DRAWN AEROBIC AND ANAEROBIC 5CC   Final   Culture PENDING   Incomplete   Report Status PENDING   Incomplete  CULTURE, BLOOD (ROUTINE X 2)     Status: None   Collection Time    04/05/13 11:34 PM      Result Value Range Status   Specimen Description Blood BLOOD LEFT HAND   Final   Special Requests BOTTLES DRAWN AEROBIC AND ANAEROBIC 5CC   Final   Culture PENDING   Incomplete   Report Status PENDING   Incomplete    Medical History: Past Medical History  Diagnosis Date  . Hyperlipidemia   . Hypertension    . Diabetes mellitus   . Asthma   . 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 [name], residual palpitations  . Vocal cord dysfunction   . Esophageal reflux   . Tremor, essential     Neurontin 600 mg qhs x 1 yr--no help.  Inderal prior to this was helpful for 10 yrs then stopped working.  . Anxiety   . Allergic rhinitis   . Low sodium syndrome   . Depression     Medications:  Albuterol  ASA  Flexeril  Cymbalta  Norco  Levemir  Synthroid  Ativan  Cozaar  Toprol XL   Nasonex  Primidone  Zoloft    Assessment: 63 yo female with AMS, possible sepsis for empiric antibiotics.  Vancomycin 1 g IV given in ED at midnight.  Goal of Therapy:  Vancomycin trough level 15-20 mcg/ml  Plan:  Vancomycin 1250 mg IV q12h Zosyn 3.375 g IV q8h   Caryl Pina 04/06/2013,4:10 AM

## 2013-04-06 NOTE — Consult Note (Signed)
Reason for Consult:Altered mental status Referring Physician: Halford Chessman  CC: Confusion  HPI: [name] is an 63 y.o. female with hx of DM who had Bartholin cyst I&D w/o event on 04/05/13 and was discharged to home. Later husband noted the patient shaking with possible witnessed seizure and then a progressive change in mental status. Pt brought to [facility] ER and was noted to have elevated blood sugar with concern for DKA. She was transferred to [facility] for further assessment.  Mental status has remained impaired.  No further questionable seizure activity noted.  Patient has been markedly hypertensive as well.  No fever noted.     Past Medical History  Diagnosis Date  . Hyperlipidemia   . Hypertension   . Diabetes mellitus   . Asthma   . 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 [name], residual palpitations  . Vocal cord dysfunction   . Esophageal reflux   . Tremor, essential     Neurontin 600 mg qhs x 1 yr--no help.  Inderal prior to this was helpful for 10 yrs then stopped working.  . Anxiety   . Allergic rhinitis   . Low sodium syndrome   . Depression     Past Surgical History  Procedure Laterality Date  . Tonsillectomy    . Right breast cyst      benign  . Left ankle ligament repair    . Left elbow repair    . Partial hysterectomy    . Cholecystectomy    . Cesarean section    . Ablation for avnrt    . Colonoscopy  01/16/2004    [id number] rectum/colon  . Esophagogastroduodenoscopy (egd) with esophageal dilation  04/03/2002    [id number] ring, otherwise normal esophagus, status post dilation with 56 F/Normal stomach  . Bartholin gland cyst excision    . Esophagogastroduodenoscopy (egd) with esophageal dilation N/A 11/08/2012    Procedure: ESOPHAGOGASTRODUODENOSCOPY (EGD) WITH ESOPHAGEAL DILATION;  Surgeon: Daneil Dolin, MD;  Location: [facility];  Service: Endoscopy;  Laterality: N/A;  11:30  . Abdominal hysterectomy      Family  History  Problem Relation Age of Onset  . Lung cancer Father     DIED AGE 48 LUNG CA  . Alcohol abuse Father   . Anxiety disorder Father   . Depression Father   . COPD Mother     DIED AGE 30,MRSA,COPD,PNEUMONIA  . Pneumonia Mother     DIED AGE 30,MRSA,COPD,PNEUMONIA  . Supraventricular tachycardia Mother   . Anxiety disorder Mother   . Depression Mother   . Alcohol abuse Mother   . COPD Brother     AGE 53  . Heart disease Sister     DIED AGE 54, SMOKER,?HEART  . Colon cancer Neg Hx     Social History:  reports that she quit smoking about 28 years ago. Her smoking use included Cigarettes. She has a 3 pack-year smoking history. She has never used smokeless tobacco. She reports that she does not drink alcohol or use illicit drugs.  Allergies  Allergen Reactions  . Metformin And Related     diarrhea  . Guaifenesin     MUCINEX REACTION: asthma attacks  . Oxycodone-Acetaminophen     TYLOX.  . Guaifenesin Er     Medications:  I have reviewed the patient's current medications. Prior to Admission:  Prescriptions prior to admission  Medication Sig Dispense Refill  . albuterol (PROAIR HFA) 108 (90 BASE) MCG/ACT  inhaler Inhale 2 puffs into the lungs every 6 (six) hours as needed for wheezing.      Marland Kitchen albuterol (PROVENTIL) (2.5 MG/3ML) 0.083% nebulizer solution Take 2.5 mg by nebulization every 6 (six) hours as needed for wheezing or shortness of breath.      Marland Kitchen aspirin EC 81 MG tablet Take 81 mg by mouth every morning.       . cyclobenzaprine (FLEXERIL) 10 MG tablet Take 0.5 tablets (5 mg total) by mouth 3 (three) times daily as needed for muscle spasms.  30 tablet  1  . DULoxetine (CYMBALTA) 60 MG capsule Take 1 capsule (60 mg total) by mouth daily.  30 capsule  2  . HYDROcodone-acetaminophen (NORCO) 5-325 MG per tablet Take 2 tablets by mouth every 4 (four) hours as needed.  10 tablet  0  . Insulin Detemir 100 UNIT/ML SOPN Inject 180 Units into the skin every morning. And pen  needles 1/day  20 pen  11  . levothyroxine (SYNTHROID, LEVOTHROID) 75 MCG tablet Take 75 mcg by mouth daily before breakfast.      . LORazepam (ATIVAN) 1 MG tablet Take 1 tablet (1 mg total) by mouth 3 (three) times daily.  90 tablet  2  . losartan (COZAAR) 50 MG tablet Take 1 tablet (50 mg total) by mouth daily.  30 tablet  6  . metoprolol succinate (TOPROL-XL) 50 MG 24 hr tablet Take 50 mg by mouth 2 (two) times daily. Take with or immediately following a meal.      . mometasone (NASONEX) 50 MCG/ACT nasal spray Place 2 sprays into the nose every morning.       . olmesartan (BENICAR) 40 MG tablet Take 1 tablet (40 mg total) by mouth daily.  90 tablet  2  . primidone (MYSOLINE) 50 MG tablet Take 150 mg by mouth 2 (two) times daily.      . Pseudoeph-Doxylamine-DM-APAP (NYQUIL PO) Take 5 mLs by mouth at bedtime as needed (sinus drainage).      . sertraline (ZOLOFT) 100 MG tablet Take 150 mg by mouth daily.      Marland Kitchen Spacer/Aero-Holding Chambers (AEROCHAMBER MV) inhaler Use as instructed  1 each  0   Scheduled: . heparin  5,000 Units Subcutaneous Q8H  . labetalol      . levothyroxine  37.5 mcg Intravenous Daily  . piperacillin-tazobactam (ZOSYN)  IV  3.375 g Intravenous Q8H  . vancomycin  1,250 mg Intravenous Q12H    ROS: Unable to obtain  Physical Examination: Blood pressure 160/85, pulse 125, temperature 98.7 F (37.1 C), temperature source Oral, resp. rate 25, height 5\' 7"  (1.702 m), weight 84.7 kg (186 lb 11.7 oz), SpO2 98.00%.  Neurologic Examination Mental Status: Patient is lying in bed with eyes closed.  With stimulation she becomes agitated.  Uncooperative.  Does not follow commands.  No speech.    Cranial Nerves: II: patient does not respond to confrontation bilaterally, pupils right 3 mm, left 3 mm,and reactive bilaterally III,IV,VI: doll's response absent bilaterally.  V,VII: corneal reflex reduced bilaterally.  Appears to have some left facial weakness  VIII: patient does not  respond to verbal stimuli IX,X: gag reflex unable to test, XI: trapezius strength unable to test bilaterally XII: tongue strength unable to test Motor: Moves all extremities against gravity with no obvious focality noted.   Sensory: Respond to noxious stimuli in all extremities. Deep Tendon Reflexes:  2+ throughout Plantars: Questionable upgoing plantar on the right. Left downgoing Cerebellar: Unable to perform  Laboratory Studies:   Basic Metabolic Panel:  Recent Labs Lab 04/05/13 2200 04/05/13 2225  NA 136*  --   K 4.0  --   CL 93*  --   CO2 15*  --   GLUCOSE 532*  --   BUN 12  --   CREATININE 0.62  --   CALCIUM 9.4  --   MG  --  1.8    Liver Function Tests:  Recent Labs Lab 04/05/13 2200  AST 12  ALT 10  ALKPHOS 144*  BILITOT 0.4  PROT 8.2  ALBUMIN 4.4   No results found for this basename: LIPASE, AMYLASE,  in the last 168 hours No results found for this basename: AMMONIA,  in the last 168 hours  CBC:  Recent Labs Lab 04/05/13 2225  WBC 15.0*  NEUTROABS 12.5*  HGB 14.5  HCT 41.8  MCV 86.7  PLT 237    Cardiac Enzymes:  Recent Labs Lab 04/05/13 2225  TROPONINI <0.30    BNP: No components found with this basename: POCBNP,   CBG:  Recent Labs Lab 04/06/13 0003 04/06/13 0103 04/06/13 0201 04/06/13 0306 04/06/13 0506  GLUCAP 416* 434* 309* 292* 244*    Microbiology: Results for orders placed during the hospital encounter of 04/05/13  CULTURE, BLOOD (ROUTINE X 2)     Status: None   Collection Time    04/05/13 11:15 PM      Result Value Range Status   Specimen Description Blood BLOOD LEFT HAND   Final   Special Requests BOTTLES DRAWN AEROBIC AND ANAEROBIC 5CC   Final   Culture PENDING   Incomplete   Report Status PENDING   Incomplete  CULTURE, BLOOD (ROUTINE X 2)     Status: None   Collection Time    04/05/13 11:34 PM      Result Value Range Status   Specimen Description Blood BLOOD LEFT HAND   Final   Special Requests  BOTTLES DRAWN AEROBIC AND ANAEROBIC 5CC   Final   Culture PENDING   Incomplete   Report Status PENDING   Incomplete  MRSA PCR SCREENING     Status: Abnormal   Collection Time    04/06/13  3:11 AM      Result Value Range Status   MRSA by PCR POSITIVE (*) NEGATIVE Final   Comment:            The GeneXpert MRSA Assay (FDA     approved for NASAL specimens     only), is one component of a     comprehensive MRSA colonization     surveillance program. It is not     intended to diagnose MRSA     infection nor to guide or     monitor treatment for     MRSA infections.     RESULT CALLED TO, READ BACK BY AND VERIFIED WITH:     TURNER,D RN [phone] AT 3762 SKEEN,P    Coagulation Studies: No results found for this basename: LABPROT, INR,  in the last 72 hours  Urinalysis:  Recent Labs Lab 04/05/13 2242  COLORURINE YELLOW  LABSPEC >1.030*  PHURINE 5.5  GLUCOSEU >1000*  HGBUR SMALL*  BILIRUBINUR NEGATIVE  KETONESUR >80*  PROTEINUR 100*  UROBILINOGEN 0.2  NITRITE NEGATIVE  LEUKOCYTESUR NEGATIVE    Lipid Panel:     Component Value Date/Time   CHOL 187 01/18/2013 1451   TRIG 96.0 01/18/2013 1451   HDL 79.70 01/18/2013 1451   CHOLHDL 2 01/18/2013 1451  VLDL 19.2 01/18/2013 1451   LDLCALC 88 01/18/2013 1451    HgbA1C:  Lab Results  Component Value Date   HGBA1C 9.5* 01/18/2013    Urine Drug Screen:     Component Value Date/Time   LABOPIA POSITIVE* 04/05/2013 2242   COCAINSCRNUR NONE DETECTED 04/05/2013 2242   LABBENZ NONE DETECTED 04/05/2013 2242   AMPHETMU NONE DETECTED 04/05/2013 2242   THCU NONE DETECTED 04/05/2013 2242   LABBARB POSITIVE* 04/05/2013 2242    Alcohol Level:  Recent Labs Lab 04/05/13 2225  ETH <11    Other results: EKG: sinus tachycardia at 128bpm.  Imaging: Ct Head Wo Contrast  04/05/2013   CLINICAL DATA:  Seizure.  Altered mental status.  Hyperglycemia.  EXAM: CT HEAD WITHOUT CONTRAST  TECHNIQUE: Contiguous axial images were obtained  from the base of the skull through the vertex without intravenous contrast.  COMPARISON:  08/16/2003  FINDINGS: Sinuses/Soft tissues: Motion degradation, most marked on the 1st attempt. Clear paranasal sinuses and mastoid air cells.  Intracranial: Mild cerebral atrophy for age. No mass lesion, hemorrhage, hydrocephalus, acute infarct, intra-axial, or extra-axial fluid collection.  IMPRESSION: 1. Motion degraded exam. 2. Given this factor, no acute intracranial abnormality. 3. Cerebral atrophy.   Electronically Signed   By: Abigail Miyamoto M.D.   On: 04/05/2013 23:15     Assessment/Plan: 63 year old female with altered mental status.  Etiology unclear at this time.  Only mild focality on neurological  examination.  Head CT reviewed and shows no acute changes.  White blood cell count elevated.  BP markedly elevated as well.  Patient also with questionable seizure.  Can not rule out PRES.  Infection remains on the differential as well despite no fever, would consider herpes encephalitis.  Patient on Zosyn and Vanc.    Recommendations: 1.  Seizure precautions 2.  EEG.  No indication for antiepileptic therapy at this time.   3.  Repeat head CT in 24-48 hours.  If patient becomes less uncooperative would consider MRI. 4.  Repeat WBC count.  If remains elevated would consider Acyclovir 5.  Agree with continued BP control. 6.  Agree with continued addressing of blood glucose 7.  Will continue to follow with you.   Alexis Goodell, MD [facility] [phone] 04/06/2013, 6:28 AM

## 2013-04-06 NOTE — H&P (Signed)
PULMONARY / CRITICAL CARE MEDICINE  Name: [name] MRN: [id number] DOB: [birth year]    ADMISSION DATE:  04/05/2013 CONSULTATION DATE:  04/06/2013  REFERRING MD :  [facility] EDP PRIMARY SERVICE:  PCCM  CHIEF COMPLAINT:  DKA  BRIEF PATIENT DESCRIPTION:  63 yo female with hx of DM had Bartholin cyst I&D w/o event and d/c home.  Later husband shaking with possible witnessed seizure and then progressive change in mental status.  Pt brought to [facility] ER and was noted to have elevated blood sugar with concern for DKA.  She was transferred to [facility] for further assessment.  SIGNIFICANT EVENTS: 12/31  Underwent Bartholin cyst I&D and returned with acute encephalopathy and DKA 01/01  Transferred to [facility], neuro consulted  STUDIES:  12/31  Head CT >>> nad  LINES / TUBES: Foley 01/01 >>>  CULTURES: 12/31 Blood >>>  ANTIBIOTICS: Zosyn 01/01 >>> Vancomycin 01/01 >>>  The patient is encephalopathic and unable to provide history, which was obtained for available medical records.  HISTORY OF PRESENT ILLNESS:   63 yo female present to ER on 12/31 with Rt labia abscess.  She had I&D in ER, and given morphine.  Thirty minutes after returning home family reports she became confused and urinated on floor.  She was then reported to have shaking with possible seizure witnessed by her husband.  She was brought back to ER at [facility] and noted to be very combative.  She was found to have elevated CBG with concern for DKA.  She had CT head which was unrevealing, but she needed 4 mg ativan to undergo scanning.  She was transferred to  [facility] assessment.  PAST MEDICAL HISTORY :  Past Medical History  Diagnosis Date  . Hyperlipidemia   . Hypertension   . Diabetes mellitus   . Asthma   . 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 [name], residual palpitations  . Vocal cord dysfunction   . Esophageal reflux   . Tremor, essential     Neurontin 600 mg qhs x 1 yr--no  help.  Inderal prior to this was helpful for 10 yrs then stopped working.  . Anxiety   . Allergic rhinitis   . Low sodium syndrome   . Depression    Past Surgical History  Procedure Laterality Date  . Tonsillectomy    . Right breast cyst      benign  . Left ankle ligament repair    . Left elbow repair    . Partial hysterectomy    . Cholecystectomy    . Cesarean section    . Ablation for avnrt    . Colonoscopy  01/16/2004    [id number] rectum/colon  . Esophagogastroduodenoscopy (egd) with esophageal dilation  04/03/2002    NWG:[id number] ring, otherwise normal esophagus, status post dilation with 56 F/Normal stomach  . Bartholin gland cyst excision    . Esophagogastroduodenoscopy (egd) with esophageal dilation N/A 11/08/2012    Procedure: ESOPHAGOGASTRODUODENOSCOPY (EGD) WITH ESOPHAGEAL DILATION;  Surgeon: Daneil Dolin, MD;  Location: [facility];  Service: Endoscopy;  Laterality: N/A;  11:30  . Abdominal hysterectomy     Prior to Admission medications   Medication Sig Start Date End Date Taking? Authorizing Provider  albuterol (PROAIR HFA) 108 (90 BASE) MCG/ACT inhaler Inhale 2 puffs into the lungs every 6 (six) hours as needed for wheezing.    Historical Provider, MD  albuterol (PROVENTIL) (2.5 MG/3ML) 0.083% nebulizer solution Take 2.5 mg  by nebulization every 6 (six) hours as needed for wheezing or shortness of breath.    Historical Provider, MD  aspirin EC 81 MG tablet Take 81 mg by mouth every morning.     Historical Provider, MD  cyclobenzaprine (FLEXERIL) 10 MG tablet Take 0.5 tablets (5 mg total) by mouth 3 (three) times daily as needed for muscle spasms. 03/07/13   Lyndal Pulley, DO  DULoxetine (CYMBALTA) 60 MG capsule Take 1 capsule (60 mg total) by mouth daily. 03/09/13 03/09/14  Levonne Spiller, MD  HYDROcodone-acetaminophen (NORCO) 5-325 MG per tablet Take 2 tablets by mouth every 4 (four) hours as needed. 04/05/13   Mariea Clonts, MD  Insulin Detemir 100 UNIT/ML SOPN  Inject 180 Units into the skin every morning. And pen needles 1/day 03/07/13   Renato Shin, MD  levothyroxine (SYNTHROID, LEVOTHROID) 75 MCG tablet Take 75 mcg by mouth daily before breakfast.    Historical Provider, MD  LORazepam (ATIVAN) 1 MG tablet Take 1 tablet (1 mg total) by mouth 3 (three) times daily. 03/09/13 03/09/14  Levonne Spiller, MD  losartan (COZAAR) 50 MG tablet Take 1 tablet (50 mg total) by mouth daily. 04/04/13   Lendon Colonel, NP  metoprolol succinate (TOPROL-XL) 50 MG 24 hr tablet Take 50 mg by mouth 2 (two) times daily. Take with or immediately following a meal.    Historical Provider, MD  mometasone (NASONEX) 50 MCG/ACT nasal spray Place 2 sprays into the nose every morning.     Historical Provider, MD  olmesartan (BENICAR) 40 MG tablet Take 1 tablet (40 mg total) by mouth daily. 03/20/13   Herminio Commons, MD  primidone (MYSOLINE) 50 MG tablet Take 150 mg by mouth 2 (two) times daily.    Historical Provider, MD  Pseudoeph-Doxylamine-DM-APAP (NYQUIL PO) Take 5 mLs by mouth at bedtime as needed (sinus drainage).    Historical Provider, MD  sertraline (ZOLOFT) 100 MG tablet Take 150 mg by mouth daily.    Historical Provider, MD  Spacer/Aero-Holding Chambers (AEROCHAMBER MV) inhaler Use as instructed 01/10/13   Deneise Lever, MD   Allergies  Allergen Reactions  . Metformin And Related     diarrhea  . Guaifenesin     MUCINEX REACTION: asthma attacks  . Oxycodone-Acetaminophen     TYLOX.  . Guaifenesin Er    FAMILY HISTORY:  Family History  Problem Relation Age of Onset  . Lung cancer Father     DIED AGE 61 LUNG CA  . Alcohol abuse Father   . Anxiety disorder Father   . Depression Father   . COPD Mother     DIED AGE 50,MRSA,COPD,PNEUMONIA  . Pneumonia Mother     DIED AGE 50,MRSA,COPD,PNEUMONIA  . Supraventricular tachycardia Mother   . Anxiety disorder Mother   . Depression Mother   . Alcohol abuse Mother   . COPD Brother     AGE 56  . Heart disease  Sister     DIED AGE 67, SMOKER,?HEART  . Colon cancer Neg Hx    SOCIAL HISTORY:  reports that she quit smoking about 28 years ago. Her smoking use included Cigarettes. She has a 3 pack-year smoking history. She has never used smokeless tobacco. She reports that she does not drink alcohol or use illicit drugs.  REVIEW OF SYSTEMS:  Unable to provide.  INTERVAL HISTORY:  VITAL SIGNS: Temp:  [98.7 F (37.1 C)] 98.7 F (37.1 C) (01/01 0313) Pulse Rate:  [126-139] 132 (01/01 0313) Resp:  [16-28] 28 (  01/01 0313) BP: (132-181)/(82-119) 181/97 mmHg (01/01 0313) SpO2:  [97 %-100 %] 98 % (01/01 0313) Weight:  [86.183 kg (190 lb)] 86.183 kg (190 lb) (01/01 0004) 99% room air  INTAKE / OUTPUT: Intake/Output     12/31 0701 - 01/01 0700   I.V. (mL/kg) 150 (1.7)   Total Intake(mL/kg) 150 (1.7)   Urine (mL/kg/hr) 2200   Total Output 2200   Net -2050         PHYSICAL EXAMINATION: General: Ill appearing, tremulous Neuro: somnolent, randomly grabbing, mumbles with stimulation, positive gag reflex HEENT: pupils reactive, no sinus tenderness, difficult to assess oral cavity due to uncooperative pt but no obvious lesions, no LAN Cardiovascular:  Regular, tachycardic Lungs: decreased breath sounds, no wheeze Abdomen:  Soft, non tender, + bowel sounds Musculoskeletal:  No edema Skin:  Intact  LABS: CBC  Recent Labs Lab 04/05/13 2225  WBC 15.0*  HGB 14.5  HCT 41.8  PLT 237   Coag's No results found for this basename: APTT, INR,  in the last 168 hours BMET  Recent Labs Lab 04/05/13 2200  NA 136*  K 4.0  CL 93*  CO2 15*  BUN 12  CREATININE 0.62  GLUCOSE 532*   Electrolytes  Recent Labs Lab 04/05/13 2200 04/05/13 2225  CALCIUM 9.4  --   MG  --  1.8   Sepsis Markers  Recent Labs Lab 04/05/13 2315  LATICACIDVEN 4.8*   ABG  Recent Labs Lab 04/05/13 2235  PHART 7.300*  PCO2ART 30.0*  PO2ART 87.0   Liver Enzymes  Recent Labs Lab 04/05/13 2200  AST 12   ALT 10  ALKPHOS 144*  BILITOT 0.4  ALBUMIN 4.4   Cardiac Enzymes  Recent Labs Lab 04/05/13 2225  TROPONINI <0.30   Glucose  Recent Labs Lab 04/05/13 2206 04/06/13 0003 04/06/13 0103 04/06/13 0201  GLUCAP 462* 416* 434* 309*   Imaging Ct Head Wo Contrast  04/05/2013   CLINICAL DATA:  Seizure.  Altered mental status.  Hyperglycemia.  EXAM: CT HEAD WITHOUT CONTRAST  TECHNIQUE: Contiguous axial images were obtained from the base of the skull through the vertex without intravenous contrast.  COMPARISON:  08/16/2003  FINDINGS: Sinuses/Soft tissues: Motion degradation, most marked on the 1st attempt. Clear paranasal sinuses and mastoid air cells.  Intracranial: Mild cerebral atrophy for age. No mass lesion, hemorrhage, hydrocephalus, acute infarct, intra-axial, or extra-axial fluid collection.  IMPRESSION: 1. Motion degraded exam. 2. Given this factor, no acute intracranial abnormality. 3. Cerebral atrophy.   Electronically Signed   By: Abigail Miyamoto M.D.   On: 04/05/2013 23:15     ASSESSMENT / PLAN:  PULMONARY A:   At risk for needing artificial airway. H/o vocal cord dysfunction, asthma. P:   Goal SpO2>92 Supplemental oxygen Monitor clinically Preadmission Albuterol PRN F/u CXR  CARDIOVASCULAR A:  Hypertension. Hx of AV nodal re-entry tachycardia. P: Goal BP<160/90 Labetalol PRN Preadmission ASA, Cozaar, Toprol, Benicar when able  RENAL A: Metabolic acidosis secondary to DKA. Hypomagnesemia. P:   Trend BMP q2h x 4, then q4h Fluids per DKA protocol  GASTROINTESTINAL A:   Nutrition. P:   NPO  HEMATOLOGIC A: Leukocytosis P:  Trend CBC Heparin SQ  INFECTIOUS A:   Suspected soft tissue infection after Bartholin cyst I&D. P:   Vancomycin / Zosyn Blood cx PCT  ENDOCRINE  A: DM. DKA. Hypothyroidism >> TSH from 01/18/13 was 1.69. P:   DKA protocol IV levothyroxine  NEUROLOGIC A:   Acute encephalopathy ? Cause >> DKA likely  contributing,  but unlikely to be sole reason. ?report of seizure prior to admission. Preadmission medication effects? P: Hold Flexeril, Hydrocodone, Ativan, Primidone, Zoloft, Cymbalta EEG May need MRI Neurology consulted   No family available at the time of my examination.  CC time 45 minutes.  Chesley Mires, MD [facility] Pulmonary/Critical Care 04/06/2013, 4:50 AM Pager:  [phone] After 3pm call: [phone]

## 2013-04-06 NOTE — ED Notes (Signed)
Dr. Wilson Singer informed of elevated blood pressure. No furthter orders given

## 2013-04-07 ENCOUNTER — Ambulatory Visit: Payer: BC Managed Care – PPO | Admitting: Family Medicine

## 2013-04-07 ENCOUNTER — Inpatient Hospital Stay (HOSPITAL_COMMUNITY): Payer: BC Managed Care – PPO

## 2013-04-07 ENCOUNTER — Encounter (HOSPITAL_COMMUNITY): Payer: Self-pay

## 2013-04-07 LAB — GLUCOSE, CAPILLARY
Glucose-Capillary: 116 mg/dL — ABNORMAL HIGH (ref 70–99)
Glucose-Capillary: 119 mg/dL — ABNORMAL HIGH (ref 70–99)
Glucose-Capillary: 119 mg/dL — ABNORMAL HIGH (ref 70–99)
Glucose-Capillary: 125 mg/dL — ABNORMAL HIGH (ref 70–99)
Glucose-Capillary: 125 mg/dL — ABNORMAL HIGH (ref 70–99)
Glucose-Capillary: 126 mg/dL — ABNORMAL HIGH (ref 70–99)
Glucose-Capillary: 134 mg/dL — ABNORMAL HIGH (ref 70–99)
Glucose-Capillary: 144 mg/dL — ABNORMAL HIGH (ref 70–99)
Glucose-Capillary: 145 mg/dL — ABNORMAL HIGH (ref 70–99)
Glucose-Capillary: 148 mg/dL — ABNORMAL HIGH (ref 70–99)
Glucose-Capillary: 153 mg/dL — ABNORMAL HIGH (ref 70–99)
Glucose-Capillary: 154 mg/dL — ABNORMAL HIGH (ref 70–99)
Glucose-Capillary: 155 mg/dL — ABNORMAL HIGH (ref 70–99)
Glucose-Capillary: 156 mg/dL — ABNORMAL HIGH (ref 70–99)
Glucose-Capillary: 163 mg/dL — ABNORMAL HIGH (ref 70–99)
Glucose-Capillary: 166 mg/dL — ABNORMAL HIGH (ref 70–99)
Glucose-Capillary: 172 mg/dL — ABNORMAL HIGH (ref 70–99)
Glucose-Capillary: 173 mg/dL — ABNORMAL HIGH (ref 70–99)
Glucose-Capillary: 228 mg/dL — ABNORMAL HIGH (ref 70–99)
Glucose-Capillary: 242 mg/dL — ABNORMAL HIGH (ref 70–99)
Glucose-Capillary: 297 mg/dL — ABNORMAL HIGH (ref 70–99)
Glucose-Capillary: 367 mg/dL — ABNORMAL HIGH (ref 70–99)

## 2013-04-07 LAB — BASIC METABOLIC PANEL
BUN: 8 mg/dL (ref 6–23)
BUN: 7 mg/dL (ref 6–23)
BUN: 6 mg/dL (ref 6–23)
BUN: 6 mg/dL (ref 6–23)
CO2: 16 mEq/L — ABNORMAL LOW (ref 19–32)
CO2: 18 mEq/L — ABNORMAL LOW (ref 19–32)
CO2: 19 mEq/L (ref 19–32)
CO2: 18 mEq/L — ABNORMAL LOW (ref 19–32)
Calcium: 8.4 mg/dL (ref 8.4–10.5)
Calcium: 8.6 mg/dL (ref 8.4–10.5)
Calcium: 8.6 mg/dL (ref 8.4–10.5)
Calcium: 8.5 mg/dL (ref 8.4–10.5)
Chloride: 106 mEq/L (ref 96–112)
Chloride: 110 mEq/L (ref 96–112)
Chloride: 109 mEq/L (ref 96–112)
Chloride: 107 mEq/L (ref 96–112)
Creatinine, Ser: 0.74 mg/dL (ref 0.50–1.10)
Creatinine, Ser: 0.76 mg/dL (ref 0.50–1.10)
Creatinine, Ser: 0.73 mg/dL (ref 0.50–1.10)
Creatinine, Ser: 0.75 mg/dL (ref 0.50–1.10)
GFR calc Af Amer: >90 mL/min (ref >90)
GFR calc Af Amer: >90 mL/min (ref >90)
GFR calc Af Amer: >90 mL/min (ref >90)
GFR calc Af Amer: >90 mL/min (ref >90)
GFR calc non Af Amer: 88 mL/min — ABNORMAL LOW (ref >90)
GFR calc non Af Amer: 87 mL/min — ABNORMAL LOW (ref >90)
GFR calc non Af Amer: 88 mL/min — ABNORMAL LOW (ref >90)
GFR calc non Af Amer: 88 mL/min — ABNORMAL LOW (ref >90)
Glucose, Bld: 302 mg/dL — ABNORMAL HIGH (ref 70–99)
Glucose, Bld: 152 mg/dL — ABNORMAL HIGH (ref 70–99)
Glucose, Bld: 133 mg/dL — ABNORMAL HIGH (ref 70–99)
Glucose, Bld: 169 mg/dL — ABNORMAL HIGH (ref 70–99)
Potassium: 3.8 mEq/L (ref 3.7–5.3)
Potassium: 3.4 mEq/L — ABNORMAL LOW (ref 3.7–5.3)
Potassium: 3.5 mEq/L — ABNORMAL LOW (ref 3.7–5.3)
Potassium: 3.4 mEq/L — ABNORMAL LOW (ref 3.7–5.3)
Sodium: 140 mEq/L (ref 137–147)
Sodium: 144 mEq/L (ref 137–147)
Sodium: 142 mEq/L (ref 137–147)
Sodium: 141 mEq/L (ref 137–147)

## 2013-04-07 LAB — CBC
HCT: 39.2 % (ref 36.0–46.0)
Hemoglobin: 14 g/dL (ref 12.0–15.0)
MCH: 30.8 pg (ref 26.0–34.0)
MCHC: 35.7 g/dL (ref 30.0–36.0)
MCV: 86.2 fL (ref 78.0–100.0)
Platelets: 163 10*3/uL (ref 150–400)
RBC: 4.55 MIL/uL (ref 3.87–5.11)
RDW: 12.9 % (ref 11.5–15.5)
WBC: 10.5 10*3/uL (ref 4.0–10.5)

## 2013-04-07 LAB — COMPREHENSIVE METABOLIC PANEL
ALT: 10 U/L (ref 0–35)
AST: 27 U/L (ref 0–37)
Albumin: 3.4 g/dL — ABNORMAL LOW (ref 3.5–5.2)
Alkaline Phosphatase: 100 U/L (ref 39–117)
BUN: 7 mg/dL (ref 6–23)
CO2: 19 mEq/L (ref 19–32)
Calcium: 8.7 mg/dL (ref 8.4–10.5)
Chloride: 108 mEq/L (ref 96–112)
Creatinine, Ser: 0.72 mg/dL (ref 0.50–1.10)
GFR calc Af Amer: >90 mL/min (ref >90)
GFR calc non Af Amer: 89 mL/min — ABNORMAL LOW (ref >90)
Glucose, Bld: 124 mg/dL — ABNORMAL HIGH (ref 70–99)
Potassium: 4.1 mEq/L (ref 3.7–5.3)
Sodium: 140 mEq/L (ref 137–147)
Total Bilirubin: 0.6 mg/dL (ref 0.3–1.2)
Total Protein: 6.6 g/dL (ref 6.0–8.3)

## 2013-04-07 LAB — PHOSPHORUS: Phosphorus: 2 mg/dL — ABNORMAL LOW (ref 2.3–4.6)

## 2013-04-07 LAB — FOLATE RBC: RBC Folate: 507 ng/mL (ref >280)

## 2013-04-07 LAB — KETONES, QUALITATIVE: Acetone, Bld: NEGATIVE

## 2013-04-07 LAB — LACTIC ACID, PLASMA: Lactic Acid, Venous: 1.4 mmol/L (ref 0.5–2.2)

## 2013-04-07 LAB — PROCALCITONIN: Procalcitonin: <0.1 ng/mL

## 2013-04-07 LAB — MAGNESIUM: Magnesium: 1.6 mg/dL (ref 1.5–2.5)

## 2013-04-07 MED ORDER — INSULIN ASPART 100 UNIT/ML ~~LOC~~ SOLN
2.0000 [IU] | SUBCUTANEOUS | Status: DC
  Administered 2013-04-07: 4 [IU] via SUBCUTANEOUS
  Administered 2013-04-07: 6 [IU] via SUBCUTANEOUS
  Administered 2013-04-07: 4 [IU] via SUBCUTANEOUS
  Administered 2013-04-07: 2 [IU] via SUBCUTANEOUS
  Administered 2013-04-07: 6 [IU] via SUBCUTANEOUS

## 2013-04-07 MED ORDER — DEXMEDETOMIDINE HCL IN NACL 400 MCG/100ML IV SOLN
0.4000 ug/kg/h | INTRAVENOUS | Status: DC
  Administered 2013-04-07 (×2): 1.2 ug/kg/h via INTRAVENOUS
  Filled 2013-04-07 (×3): qty 100

## 2013-04-07 NOTE — Progress Notes (Addendum)
NEURO HOSPITALIST PROGRESS NOTE   SUBJECTIVE:                                                                                                                        Alert and awake. Calmer today, but still on restrains and requiring high dose Precedex. Offers no neurological complains. On Zosyn, vancomycin. EEG pending.   OBJECTIVE:                                                                                                                           Vital signs in last 24 hours: Temp:  [96.6 F (35.9 C)-100.1 F (37.8 C)] 99.1 F (37.3 C) (01/02 0754) Pulse Rate:  [74-121] 96 (01/02 0800) Resp:  [18-30] 21 (01/02 0800) BP: (104-186)/(53-154) 128/64 mmHg (01/02 0800) SpO2:  [93 %-100 %] 99 % (01/02 0800)  Intake/Output from previous day: 01/01 0701 - 01/02 0700 In: 3544.1 [I.V.:2844.1; IV Piggyback:700] Out: 2425 [Urine:2425] Intake/Output this shift: Total I/O In: 125.4 [I.V.:125.4] Out: 45 [Urine:45] Nutritional status: NPO  Past Medical History  Diagnosis Date  . Hyperlipidemia   . Hypertension   . Diabetes mellitus   . Asthma   . 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 [name], residual palpitations  . Vocal cord dysfunction   . Esophageal reflux   . Tremor, essential     Neurontin 600 mg qhs x 1 yr--no help.  Inderal prior to this was helpful for 10 yrs then stopped working.  . Anxiety   . Allergic rhinitis   . Low sodium syndrome   . Depression     Neurologic Exam:  Mental Status: Alert, awake, oriented x 4, thought content appropriate.  Speech fluent without evidence of aphasia.  Able to follow 3 step commands without difficulty. Cranial Nerves: II: Discs flat bilaterally; Visual fields grossly normal, pupils equal, round, reactive to light and accommodation III,IV, VI: ptosis not present, extra-ocular motions intact bilaterally V,VII: smile symmetric, facial light touch  sensation normal bilaterally VIII: hearing normal bilaterally IX,X: gag reflex present XI: bilateral shoulder shrug XII: midline tongue extension without atrophy or fasciculations  Motor: Right : Upper extremity   5/5    Left:  Upper extremity   5/5  Lower extremity   5/5     Lower extremity   5/5 Tone and bulk:normal tone throughout; no atrophy noted Sensory: Pinprick and light touch intact throughout, bilaterally Deep Tendon Reflexes:  Right: Upper Extremity   Left: Upper extremity   biceps (C-5 to C-6) 2/4   biceps (C-5 to C-6) 2/4 tricep (C7) 2/4    triceps (C7) 2/4 Brachioradialis (C6) 2/4  Brachioradialis (C6) 2/4  Lower Extremity Lower Extremity  quadriceps (L-2 to L-4) 2/4   quadriceps (L-2 to L-4) 2/4 Achilles (S1) 2/4   Achilles (S1) 2/4  Plantars: Right: downgoing   Left: downgoing Cerebellar: Unable to test. Gait:  Unable to test. CV: pulses palpable throughout    Lab Results: Lab Results  Component Value Date/Time   CHOL 187 01/18/2013  2:51 PM   Lipid Panel No results found for this basename: CHOL, TRIG, HDL, CHOLHDL, VLDL, LDLCALC,  in the last 72 hours  Studies/Results: Ct Head Wo Contrast  04/05/2013   CLINICAL DATA:  Seizure.  Altered mental status.  Hyperglycemia.  EXAM: CT HEAD WITHOUT CONTRAST  TECHNIQUE: Contiguous axial images were obtained from the base of the skull through the vertex without intravenous contrast.  COMPARISON:  08/16/2003  FINDINGS: Sinuses/Soft tissues: Motion degradation, most marked on the 1st attempt. Clear paranasal sinuses and mastoid air cells.  Intracranial: Mild cerebral atrophy for age. No mass lesion, hemorrhage, hydrocephalus, acute infarct, intra-axial, or extra-axial fluid collection.  IMPRESSION: 1. Motion degraded exam. 2. Given this factor, no acute intracranial abnormality. 3. Cerebral atrophy.   Electronically Signed   By: Abigail Miyamoto M.D.   On: 04/05/2013 23:15   Dg Chest Port 1 View  04/06/2013   CLINICAL  DATA:  Atelectasis  EXAM: PORTABLE CHEST - 1 VIEW  COMPARISON:  Prior chest x-ray 12/04/2012  FINDINGS: The heart size and mediastinal contours are within normal limits. Both lungs are clear. The visualized skeletal structures are unremarkable.  IMPRESSION: No active disease.   Electronically Signed   By: Jacqulynn Cadet M.D.   On: 04/06/2013 09:13    MEDICATIONS                                                                                                                       I have reviewed the patient's current medications.  ASSESSMENT/PLAN:                                                                                                           Resolving acute encephalopathy, multifactorial,  with initial concern for seizures, PRES, encephalitis,  DKA. She seems to be doing better at this moment. EEG pending. Still with bursts of agitation and doubt she will be able to have MRI. Will follow up.  Dorian Pod, MD [facility] [phone]  04/07/2013, 8:21 AM Patient is remarkably improved today.EEG consistent with mild encephalopathy. No further neurological intervention. Will sign off. Donzetta Kohut Sharrie Self,MD

## 2013-04-07 NOTE — Progress Notes (Addendum)
Name: [name] MRN: [id number] DOB: [birth year]    ADMISSION DATE:  04/05/2013 CONSULTATION DATE:  04/06/2013  REFERRING MD :  AP EDP PRIMARY SERVICE: PCCM  CHIEF COMPLAINT:  DKA  BRIEF PATIENT DESCRIPTION:  63 yo female with hx of DM had Bartholin cyst I&D w/o event and d/c home. Later husband reports shaking with progressive change in mental status unclear if seizure as pt with bleeding tongue but no loss of conscious per husband. Blood pressures fluctuated in elevation with max 200/131 recorded thus PRES in differential. Pt brought to [facility] ER and was noted to have elevated blood sugar with concern for DKA. She was transferred to [facility] for further assessment.   SIGNIFICANT EVENTS:  12/31 Underwent Bartholin cyst I&D and returned with acute encephalopathy and DKA  01/01 Transferred to [facility], neuro consulted   STUDIES:  12/31 Head CT >>> nad   LINES / TUBES:  Foley 01/01 >>>   CULTURES:  12/31 Blood >>> ngtd x 2d  ANTIBIOTICS:  Zosyn 01/01 >>>  Vancomycin 01/01 >>>   Prior to Admission medications   Medication Sig Start Date End Date Taking? Authorizing Provider  albuterol (PROAIR HFA) 108 (90 BASE) MCG/ACT inhaler Inhale 2 puffs into the lungs every 6 (six) hours as needed for wheezing.   Yes Historical Provider, MD  albuterol (PROVENTIL) (2.5 MG/3ML) 0.083% nebulizer solution Take 2.5 mg by nebulization every 6 (six) hours as needed for wheezing or shortness of breath.   Yes Historical Provider, MD  aspirin EC 81 MG tablet Take 81 mg by mouth every morning.    Yes Historical Provider, MD  cyclobenzaprine (FLEXERIL) 10 MG tablet Take 0.5 tablets (5 mg total) by mouth 3 (three) times daily as needed for muscle spasms. 03/07/13  Yes Lyndal Pulley, DO  DULoxetine (CYMBALTA) 60 MG capsule Take 1 capsule (60 mg total) by mouth daily. 03/09/13 03/09/14 Yes Levonne Spiller, MD  HYDROcodone-acetaminophen (NORCO) 5-325 MG per tablet Take 2 tablets by mouth every 4 (four) hours  as needed. 04/05/13  Yes Mariea Clonts, MD  Insulin Detemir 100 UNIT/ML SOPN Inject 180 Units into the skin every morning. And pen needles 1/day 03/07/13  Yes Renato Shin, MD  levothyroxine (SYNTHROID, LEVOTHROID) 75 MCG tablet Take 75 mcg by mouth daily before breakfast.   Yes Historical Provider, MD  LORazepam (ATIVAN) 1 MG tablet Take 1 tablet (1 mg total) by mouth 3 (three) times daily. 03/09/13 03/09/14 Yes Levonne Spiller, MD  losartan (COZAAR) 50 MG tablet Take 1 tablet (50 mg total) by mouth daily. 04/04/13  Yes Lendon Colonel, NP  metoprolol succinate (TOPROL-XL) 50 MG 24 hr tablet Take 50 mg by mouth 2 (two) times daily. Take with or immediately following a meal.   Yes Historical Provider, MD  mometasone (NASONEX) 50 MCG/ACT nasal spray Place 2 sprays into the nose every morning.    Yes Historical Provider, MD  olmesartan (BENICAR) 40 MG tablet Take 1 tablet (40 mg total) by mouth daily. 03/20/13  Yes Herminio Commons, MD  primidone (MYSOLINE) 50 MG tablet Take 150 mg by mouth 2 (two) times daily.   Yes Historical Provider, MD  Pseudoeph-Doxylamine-DM-APAP (NYQUIL PO) Take 5 mLs by mouth at bedtime as needed (sinus drainage).   Yes Historical Provider, MD  sertraline (ZOLOFT) 100 MG tablet Take 150 mg by mouth daily.   Yes Historical Provider, MD   Allergies  Allergen Reactions  . Metformin And Related     diarrhea  .  Guaifenesin     MUCINEX REACTION: asthma attacks  . Oxycodone-Acetaminophen     TYLOX.  . Guaifenesin Er       SUBJECTIVE: Overnight with continued agitation and combativeness requiring Precedex. This morning pt appropriate and cooperate while being weaned from sedation.  Reports that she had missed 3 days of insulin because she "ran out' of her medicine.  VITAL SIGNS: Temp:  [96.6 F (35.9 C)-99.6 F (37.6 C)] 99.1 F (37.3 C) (01/02 0754) Pulse Rate:  [74-109] 96 (01/02 0800) Resp:  [18-27] 21 (01/02 0800) BP: (113-186)/(53-154) 128/64 mmHg (01/02  0800) SpO2:  [93 %-100 %] 99 % (01/02 0800)       INTAKE / OUTPUT: Intake/Output     01/01 0701 - 01/02 0700 01/02 0701 - 01/03 0700   I.V. (mL/kg) 2844.1 (33.6) 125.4 (1.5)   IV Piggyback 700    Total Intake(mL/kg) 3544.1 (41.8) 125.4 (1.5)   Urine (mL/kg/hr) 2425 (1.2) 145 (0.4)   Total Output 2425 145   Net +1119.1 -19.6          PHYSICAL EXAMINATION: General: Well-developed, well-nourished, WF, initially sleeping with husband at bedside, in no acute distress; Head: Normocephalic, atraumatic. Eyes: PERRLA, EOMI, No signs of anemia or jaundice. Neck: supple Lungs: Normal respiratory effort. Clear to auscultation bilaterally from apices to bases without crackles or wheezes appreciated. Heart: normal rate, regular rhythm, normal S1 and S2, no gallop, murmur, or rubs appreciated. Abdomen: BS normoactive. Soft, Nondistended, non-tender. No masses or organomegaly appreciated. Extremities: No pretibial edema, distal pulses intact Neurologic: alert and oriented to self and husband, in loose restraints, appropriate behavior on exam this morning   LABS:  CBC  Recent Labs Lab 04/05/13 2225 04/06/13 0535 04/07/13 0228  WBC 15.0* 14.1* 10.5  HGB 14.5 13.1 14.0  HCT 41.8 37.3 39.2  PLT 237 202 163   BMET  Recent Labs Lab 04/06/13 1928 04/07/13 0228 04/07/13 0705  NA 140 140 140  K 3.1* 4.1 3.8  CL 109 108 106  CO2 19 19 16*  BUN 7 7 8   CREATININE 0.68 0.72 0.74  GLUCOSE 147* 124* 302*   Electrolytes  Recent Labs Lab 04/05/13 2200 04/05/13 2225  04/06/13 1928 04/07/13 0228 04/07/13 0705  CALCIUM 9.4  --   < > 8.6 8.7 8.4  MG  --  1.8  --   --  1.6  --   PHOS  --   --   --   --  2.0*  --   < > = values in this interval not displayed. Sepsis Markers  Recent Labs Lab 04/05/13 2315 04/06/13 0535 04/07/13 0228  LATICACIDVEN 4.8*  --   --   PROCALCITON  --  <0.10 <0.10   ABG  Recent Labs Lab 04/05/13 2235  PHART 7.300*  PCO2ART 30.0*  PO2ART 87.0    Liver Enzymes  Recent Labs Lab 04/05/13 2200 04/07/13 0228  AST 12 27  ALT 10 10  ALKPHOS 144* 100  BILITOT 0.4 0.6  ALBUMIN 4.4 3.4*   Cardiac Enzymes  Recent Labs Lab 04/05/13 2225  TROPONINI <0.30   Glucose  Recent Labs Lab 04/06/13 2359 04/07/13 0105 04/07/13 0154 04/07/13 0321 04/07/13 0426 04/07/13 0749  GLUCAP 126* 119* 116* 134* 163* 242*    Imaging Ct Head Wo Contrast  04/05/2013   CLINICAL DATA:  Seizure.  Altered mental status.  Hyperglycemia.  EXAM: CT HEAD WITHOUT CONTRAST  TECHNIQUE: Contiguous axial images were obtained from the base of the skull through  the vertex without intravenous contrast.  COMPARISON:  08/16/2003  FINDINGS: Sinuses/Soft tissues: Motion degradation, most marked on the 1st attempt. Clear paranasal sinuses and mastoid air cells.  Intracranial: Mild cerebral atrophy for age. No mass lesion, hemorrhage, hydrocephalus, acute infarct, intra-axial, or extra-axial fluid collection.  IMPRESSION: 1. Motion degraded exam. 2. Given this factor, no acute intracranial abnormality. 3. Cerebral atrophy.   Electronically Signed   By: Abigail Miyamoto M.D.   On: 04/05/2013 23:15   Dg Chest Port 1 View  04/06/2013   CLINICAL DATA:  Atelectasis  EXAM: PORTABLE CHEST - 1 VIEW  COMPARISON:  Prior chest x-ray 12/04/2012  FINDINGS: The heart size and mediastinal contours are within normal limits. Both lungs are clear. The visualized skeletal structures are unremarkable.  IMPRESSION: No active disease.   Electronically Signed   By: Jacqulynn Cadet M.D.   On: 04/06/2013 09:13   CXR: no active disease  ASSESSMENT / PLAN:  PULMONARY  A:  H/o vocal cord dysfunction, asthma.  P:  Goal SpO2>92  Supplemental oxygen  Monitor clinically  Preadmission Albuterol PRN   CARDIOVASCULAR  A:  Hypertension.  At goal today in setting of precedex, no further need for prn labetalol since yesterday Hx of AV nodal re-entry tachycardia.  P:  Goal BP<160/90  Labetalol  PRN  Cont to hold Preadmission Cozaar, Toprol for now Consider resuming home regimen ASA  RENAL  A:  Metabolic acidosis secondary to DKA Hypomagnesemia.  P:  Trend BMP q2h x 4, then q4h  Fluids per DKA protocol    GASTROINTESTINAL  A:  Nutrition.  P:  NPO   HEMATOLOGIC  A:  Leukocytosis  P:  Trend CBC  Heparin SQ   INFECTIOUS  A:  Suspected soft tissue infection after Bartholin cyst I&D.  P:  Vancomycin / Zosyn  Follow Blood cx  PCT   ENDOCRINE  A:  DM.  DKA, had run out of her insulin x 3 days. Of off insulin gtt last night though still with anion gap 13-->18-->16 Hypothyroidism >> TSH from 01/18/13 was 1.69.  P:  Check lactic acid and ketones Consider resuming insulin gtt ie DKA protocol  Cont IV levothyroxine while NPO  NEUROLOGIC  A:  Acute encephalopathy: Cause likely multifactorial in setting of DKA, possible soft skin infection and hypertensive emergency, and preadmission medication. She had received 1 dose of morphine for I&D and discharged without incident.  EEG without activity.  P:  Cont Hold Flexeril, Hydrocodone, Ativan, Primidone, Zoloft, Cymbalta  Appreciate Neurology recommendations Will defer MRI decision to Neurology   TODAY'S SUMMARY: HD#2 for Ms. Geck transferred from [facility] with confusion and DKA in setting of recent I&D of Bartholian vaginal cyst and missing insulin doses for past 3 days after "running out". Had been highly combative requiring Precedex for sedation, this morning with much improvement in agitation and mental clarity. EEG w/o seizure activity. CT of head degraded by motion. Once weaned from Otho fully today, plan to send to SDU for resolution of DKA.   Dorian Heckle, MD Internal Medicine Residient, PGY3  04/07/2013, 10:52 AM   Attending:  I have seen and examined the patient with nurse practitioner/resident and agree with the note above.   Still needing precedex for encephalopathy this morning but  improving.  PRES syndrome?  Morphine effect? Multifactorial from DKA, morphine? Unclear etiology  but improving with supportive care.  CC time 35 minutes  Jillyn Hidden PCCM Pager: [phone] Cell: [phone] If no response, call [phone]

## 2013-04-07 NOTE — Progress Notes (Signed)
Utilization review completed. Allisyn Kunz, RN, BSN. 

## 2013-04-07 NOTE — Progress Notes (Signed)
EEG completed at bedside 

## 2013-04-07 NOTE — Procedures (Signed)
EEG report.  Brief clinical history:  63 years old with altered mental status that is progressively improving. No prior history of frank epileptic seizures.  Technique: this is a 17 channel routine scalp EEG performed at the bedside with bipolar and monopolar montages arranged in accordance to the international 10/20 system of electrode placement. One channel was dedicated to EKG recording.  The study was performed during wakefulness and drowsiness. No activating procedures.  Description: the patient falls rapidly into the drowsy state, but for the most part there is evidence of diffuse, continuous, monomorphic theta slowing that doesn't follow an ictal pattern.  No epileptiform discharges noted. EKG showed sinus rhythm.  Impression: the overall findings are consistent with a mild encephalopathy, non specific as to cause. No electrographic seizures seen. Can not entirely exclude intermixed normal sleep architecture throughout the entire recording. Clinical correlation is advised.  Dorian Pod, MD

## 2013-04-08 DIAGNOSIS — K219 Gastro-esophageal reflux disease without esophagitis: Secondary | ICD-10-CM

## 2013-04-08 LAB — BASIC METABOLIC PANEL
BUN: 5 mg/dL — ABNORMAL LOW (ref 6–23)
BUN: 4 mg/dL — ABNORMAL LOW (ref 6–23)
BUN: 4 mg/dL — ABNORMAL LOW (ref 6–23)
BUN: 4 mg/dL — ABNORMAL LOW (ref 6–23)
BUN: 4 mg/dL — ABNORMAL LOW (ref 6–23)
BUN: 4 mg/dL — ABNORMAL LOW (ref 6–23)
CO2: 16 mEq/L — ABNORMAL LOW (ref 19–32)
CO2: 19 mEq/L (ref 19–32)
CO2: 19 mEq/L (ref 19–32)
CO2: 19 mEq/L (ref 19–32)
CO2: 17 mEq/L — ABNORMAL LOW (ref 19–32)
CO2: 19 mEq/L (ref 19–32)
Calcium: 8.5 mg/dL (ref 8.4–10.5)
Calcium: 8.6 mg/dL (ref 8.4–10.5)
Calcium: 8.5 mg/dL (ref 8.4–10.5)
Calcium: 8.8 mg/dL (ref 8.4–10.5)
Calcium: 8.6 mg/dL (ref 8.4–10.5)
Calcium: 8.3 mg/dL — ABNORMAL LOW (ref 8.4–10.5)
Chloride: 106 mEq/L (ref 96–112)
Chloride: 109 mEq/L (ref 96–112)
Chloride: 109 mEq/L (ref 96–112)
Chloride: 107 mEq/L (ref 96–112)
Chloride: 107 mEq/L (ref 96–112)
Chloride: 107 mEq/L (ref 96–112)
Creatinine, Ser: 0.65 mg/dL (ref 0.50–1.10)
Creatinine, Ser: 0.66 mg/dL (ref 0.50–1.10)
Creatinine, Ser: 0.65 mg/dL (ref 0.50–1.10)
Creatinine, Ser: 0.6 mg/dL (ref 0.50–1.10)
Creatinine, Ser: 0.58 mg/dL (ref 0.50–1.10)
Creatinine, Ser: 0.62 mg/dL (ref 0.50–1.10)
GFR calc Af Amer: >90 mL/min (ref >90)
GFR calc Af Amer: >90 mL/min (ref >90)
GFR calc Af Amer: >90 mL/min (ref >90)
GFR calc Af Amer: >90 mL/min (ref >90)
GFR calc Af Amer: >90 mL/min (ref >90)
GFR calc Af Amer: >90 mL/min (ref >90)
GFR calc non Af Amer: >90 mL/min (ref >90)
GFR calc non Af Amer: >90 mL/min (ref >90)
GFR calc non Af Amer: >90 mL/min (ref >90)
GFR calc non Af Amer: >90 mL/min (ref >90)
GFR calc non Af Amer: >90 mL/min (ref >90)
GFR calc non Af Amer: >90 mL/min (ref >90)
Glucose, Bld: 260 mg/dL — ABNORMAL HIGH (ref 70–99)
Glucose, Bld: 234 mg/dL — ABNORMAL HIGH (ref 70–99)
Glucose, Bld: 104 mg/dL — ABNORMAL HIGH (ref 70–99)
Glucose, Bld: 143 mg/dL — ABNORMAL HIGH (ref 70–99)
Glucose, Bld: 184 mg/dL — ABNORMAL HIGH (ref 70–99)
Glucose, Bld: 264 mg/dL — ABNORMAL HIGH (ref 70–99)
Potassium: 2.9 mEq/L — CL (ref 3.7–5.3)
Potassium: 3 mEq/L — ABNORMAL LOW (ref 3.7–5.3)
Potassium: 3 mEq/L — ABNORMAL LOW (ref 3.7–5.3)
Potassium: 2.9 mEq/L — CL (ref 3.7–5.3)
Potassium: 3.1 mEq/L — ABNORMAL LOW (ref 3.7–5.3)
Potassium: 3 mEq/L — ABNORMAL LOW (ref 3.7–5.3)
Sodium: 140 mEq/L (ref 137–147)
Sodium: 144 mEq/L (ref 137–147)
Sodium: 143 mEq/L (ref 137–147)
Sodium: 142 mEq/L (ref 137–147)
Sodium: 142 mEq/L (ref 137–147)
Sodium: 141 mEq/L (ref 137–147)

## 2013-04-08 LAB — GLUCOSE, CAPILLARY
Glucose-Capillary: 104 mg/dL — ABNORMAL HIGH (ref 70–99)
Glucose-Capillary: 124 mg/dL — ABNORMAL HIGH (ref 70–99)
Glucose-Capillary: 124 mg/dL — ABNORMAL HIGH (ref 70–99)
Glucose-Capillary: 125 mg/dL — ABNORMAL HIGH (ref 70–99)
Glucose-Capillary: 142 mg/dL — ABNORMAL HIGH (ref 70–99)
Glucose-Capillary: 142 mg/dL — ABNORMAL HIGH (ref 70–99)
Glucose-Capillary: 143 mg/dL — ABNORMAL HIGH (ref 70–99)
Glucose-Capillary: 179 mg/dL — ABNORMAL HIGH (ref 70–99)
Glucose-Capillary: 189 mg/dL — ABNORMAL HIGH (ref 70–99)
Glucose-Capillary: 193 mg/dL — ABNORMAL HIGH (ref 70–99)
Glucose-Capillary: 206 mg/dL — ABNORMAL HIGH (ref 70–99)
Glucose-Capillary: 221 mg/dL — ABNORMAL HIGH (ref 70–99)
Glucose-Capillary: 249 mg/dL — ABNORMAL HIGH (ref 70–99)
Glucose-Capillary: 252 mg/dL — ABNORMAL HIGH (ref 70–99)
Glucose-Capillary: 255 mg/dL — ABNORMAL HIGH (ref 70–99)
Glucose-Capillary: 255 mg/dL — ABNORMAL HIGH (ref 70–99)
Glucose-Capillary: 271 mg/dL — ABNORMAL HIGH (ref 70–99)

## 2013-04-08 LAB — PROCALCITONIN: Procalcitonin: <0.1 ng/mL

## 2013-04-08 MED ORDER — LEVOTHYROXINE SODIUM 75 MCG PO TABS
75.0000 ug | ORAL_TABLET | Freq: Every day | ORAL | Status: DC
  Administered 2013-04-09 – 2013-04-12 (×4): 75 ug via ORAL
  Filled 2013-04-08 (×5): qty 1

## 2013-04-08 MED ORDER — INSULIN GLARGINE 100 UNIT/ML ~~LOC~~ SOLN
15.0000 [IU] | SUBCUTANEOUS | Status: DC
  Administered 2013-04-08: 15 [IU] via SUBCUTANEOUS
  Filled 2013-04-08 (×3): qty 0.15

## 2013-04-08 MED ORDER — SODIUM CHLORIDE 0.9 % IV SOLN
INTRAVENOUS | Status: DC
  Administered 2013-04-08: 2.1 [IU]/h via INTRAVENOUS
  Filled 2013-04-08: qty 1

## 2013-04-08 MED ORDER — POTASSIUM CHLORIDE CRYS ER 20 MEQ PO TBCR
40.0000 meq | EXTENDED_RELEASE_TABLET | Freq: Two times a day (BID) | ORAL | Status: AC
  Administered 2013-04-08 (×2): 40 meq via ORAL
  Filled 2013-04-08 (×2): qty 2

## 2013-04-08 MED ORDER — INSULIN ASPART 100 UNIT/ML ~~LOC~~ SOLN
0.0000 [IU] | Freq: Three times a day (TID) | SUBCUTANEOUS | Status: DC
  Administered 2013-04-08: 7 [IU] via SUBCUTANEOUS

## 2013-04-08 MED ORDER — ASPIRIN 325 MG PO TABS
325.0000 mg | ORAL_TABLET | Freq: Every day | ORAL | Status: DC
  Administered 2013-04-08 – 2013-04-12 (×5): 325 mg via ORAL
  Filled 2013-04-08 (×5): qty 1

## 2013-04-08 MED ORDER — POTASSIUM CHLORIDE 10 MEQ/100ML IV SOLN
INTRAVENOUS | Status: AC
  Filled 2013-04-08: qty 100

## 2013-04-08 MED ORDER — POTASSIUM CHLORIDE 10 MEQ/100ML IV SOLN
INTRAVENOUS | Status: AC
  Administered 2013-04-08: 10 meq
  Filled 2013-04-08: qty 100

## 2013-04-08 MED ORDER — POTASSIUM CHLORIDE 10 MEQ/100ML IV SOLN
10.0000 meq | INTRAVENOUS | Status: AC
  Administered 2013-04-08 (×3): 10 meq via INTRAVENOUS
  Filled 2013-04-08 (×2): qty 100

## 2013-04-08 MED ORDER — ACETAMINOPHEN 325 MG PO TABS
650.0000 mg | ORAL_TABLET | Freq: Four times a day (QID) | ORAL | Status: DC | PRN
  Administered 2013-04-08 – 2013-04-10 (×2): 650 mg via ORAL
  Filled 2013-04-08 (×2): qty 2

## 2013-04-08 MED ORDER — DEXTROSE 10 % IV SOLN: INTRAVENOUS | Status: DC | PRN

## 2013-04-08 NOTE — Progress Notes (Signed)
eLink Physician-Brief Progress Note Patient Name: [name] DOB: 02/26/1951 MRN: [id number]  Date of Service  04/08/2013   HPI/Events of Note   K 2.9.  eICU Interventions  KCl 40 meq IV over 4 hours through PIV.      YACOUB,WESAM 04/08/2013, 12:40 AM

## 2013-04-08 NOTE — Progress Notes (Signed)
eLink Physician-Brief Progress Note Patient Name: MYRIKAL MESSMER DOB: Aug 12, 1950 MRN: [id number]  Date of Service  04/08/2013   HPI/Events of Note   Called multiple times regarding blood sugar control.    eICU Interventions  Rounding physician left orders for insulin drip if remains elevated, will continue as ordered by rounding MD, please address in AM during rounds.      YACOUB,WESAM 04/08/2013, 12:55 AM

## 2013-04-08 NOTE — Progress Notes (Signed)
Name: [name] MRN: [id number] DOB: 1950-11-05    ADMISSION DATE:  04/05/2013 CONSULTATION DATE:  04/06/2013  REFERRING MD :  AP EDP PRIMARY SERVICE: PCCM  CHIEF COMPLAINT:  DKA  BRIEF PATIENT DESCRIPTION:  63 y/o female with hx of DM had Bartholin cyst I&D w/o event and d/c home. Later husband reports shaking with progressive change in mental status unclear if seizure as pt with bleeding tongue but no loss of conscious per husband. Blood pressures fluctuated in elevation with max 200/131 recorded thus PRES in differential. Pt brought to [facility] ER and was noted to have elevated blood sugar with concern for DKA. She was transferred to [facility] for further assessment.   SIGNIFICANT EVENTS:  12/31 Underwent Bartholin cyst I&D and returned with acute encephalopathy and DKA  01/01 Transferred to [facility], neuro consulted   STUDIES:  12/31 Head CT >>> nad   LINES / TUBES:  Foley 01/01 >>>   CULTURES:  12/31 Blood >>> ngtd x 2d  ANTIBIOTICS:  Zosyn 01/01 >>>  Vancomycin 01/01 >>>  SUBJECTIVE: Pt reports feeling much better.  Wants to eat.   VITAL SIGNS: Temp:  [98.5 F (36.9 C)-99.6 F (37.6 C)] 98.8 F (37.1 C) (01/03 0744) Pulse Rate:  [97-122] 106 (01/03 0900) Resp:  [16-28] 23 (01/03 1100) BP: (44-175)/(11-111) 95/24 mmHg (01/03 1100) SpO2:  [97 %-100 %] 100 % (01/03 0900) Weight:  [179 lb 3.7 oz (81.3 kg)-182 lb 1.6 oz (82.6 kg)] 179 lb 3.7 oz (81.3 kg) (01/03 0500)  INTAKE / OUTPUT: Intake/Output     01/02 0701 - 01/03 0700 01/03 0701 - 01/04 0700   I.V. (mL/kg) 2345.4 (28.8) 6.6 (0.1)   IV Piggyback 450    Total Intake(mL/kg) 2795.4 (34.4) 6.6 (0.1)   Urine (mL/kg/hr) 1605 (0.8)    Total Output 1605     Net +1190.4 +6.6        Stool Occurrence 2 x      PHYSICAL EXAMINATION: General: Well-developed, well-nourished, WF, in NAD Head: Normocephalic, atraumatic. Eyes: PERRLA, EOMI Neck: supple Lungs: Normal respiratory effort. Clear to auscultation  bilaterally from apices to bases without crackles or wheezes  Heart: normal rate, regular rhythm, normal S1 and S2, no m/r/g Abdomen: BS normoactive. Soft, Nondistended, non-tender. No masses or organomegaly appreciated. Extremities: No pretibial edema, distal pulses intact Neurologic: AAOx4, speech clear, MAE  LABS:  CBC  Recent Labs Lab 04/05/13 2225 04/06/13 0535 04/07/13 0228  WBC 15.0* 14.1* 10.5  HGB 14.5 13.1 14.0  HCT 41.8 37.3 39.2  PLT 237 202 163   BMET  Recent Labs Lab 04/08/13 0345 04/08/13 0755 04/08/13 1055  NA 144 143 142  K 3.0* 3.0* 2.9*  CL 109 109 107  CO2 19 19 19   BUN 4* 4* 4*  CREATININE 0.66 0.65 0.60  GLUCOSE 234* 104* 143*   Electrolytes  Recent Labs Lab 04/05/13 2200 04/05/13 2225  04/07/13 0228  04/08/13 0345 04/08/13 0755 04/08/13 1055  CALCIUM 9.4  --   < > 8.7  < > 8.6 8.5 8.8  MG  --  1.8  --  1.6  --   --   --   --   PHOS  --   --   --  2.0*  --   --   --   --   < > = values in this interval not displayed. Sepsis Markers  Recent Labs Lab 04/05/13 2315 04/06/13 0535 04/07/13 0228 04/07/13 1400 04/08/13 0345  LATICACIDVEN 4.8*  --   --  1.4  --   PROCALCITON  --  <0.10 <0.10  --  <0.10   ABG  Recent Labs Lab 04/05/13 2235  PHART 7.300*  PCO2ART 30.0*  PO2ART 87.0   Liver Enzymes  Recent Labs Lab 04/05/13 2200 04/07/13 0228  AST 12 27  ALT 10 10  ALKPHOS 144* 100  BILITOT 0.4 0.6  ALBUMIN 4.4 3.4*   Cardiac Enzymes  Recent Labs Lab 04/05/13 2225  TROPONINI <0.30   Glucose  Recent Labs Lab 04/08/13 0523 04/08/13 0702 04/08/13 0754 04/08/13 0852 04/08/13 0959 04/08/13 1106  GLUCAP 189* 142* 104* 124* 125* 142*    Imaging No results found.  CXR: no active disease  ASSESSMENT / PLAN:  PULMONARY  A:  H/o vocal cord dysfunction, asthma.  P:  Goal SpO2>92  Supplemental oxygen  Monitor clinically  Preadmission Albuterol PRN   CARDIOVASCULAR  A:  Hypertension.  At goal today in  setting of precedex, no further need for prn labetalol since yesterday Hx of AV nodal re-entry tachycardia.  P:  Goal BP<160/90  Labetalol PRN  Cont to hold Preadmission Cozaar, Toprol for now ASA  RENAL  A:  Metabolic acidosis secondary to DKA Hypomagnesemia Hypokalemia P:  Trend BMP q4h, consider reduce in am 1/3  Fluids per DKA protocol -->D51/2NS at 75, transitioning off insulin gtt Replace K  GASTROINTESTINAL  A:  Nutrition.  P:  NPO   HEMATOLOGIC  A:  Leukocytosis  P:  Trend CBC  Heparin SQ   INFECTIOUS  A:  Suspected soft tissue infection after Bartholin cyst I&D.  P:  Vancomycin / Zosyn  Follow Blood cx  PCT   ENDOCRINE  A:  DM.  DKA, had run out of her insulin x 3 days. On/off insulin gtt, anion gap 16 Hypothyroidism >>TSH from 01/18/13 was 1.69 P:  Transition to oral synthroid   NEUROLOGIC  A:  Acute encephalopathy: Cause likely multifactorial in setting of DKA, possible soft skin infection and hypertensive emergency, and preadmission medication. She had received 1 dose of morphine for I&D and discharged without incident.  EEG without activity.  P:  Cont Hold Flexeril, Hydrocodone, Ativan, Primidone, Zoloft, Cymbalta  Appreciate Neurology recommendations Will defer MRI decision to Neurology  Transfer to [facility] as of 0700 1/4.  PCCM will sign off.    Noe Gens, NP-C [facility] Pgr: [phone] or [phone]  Attending:  I have seen and examined the patient with nurse practitioner/resident and agree with the note above.   Jillyn Hidden PCCM Pager: [phone] Cell: [phone] If no response, call [phone]

## 2013-04-09 DIAGNOSIS — G9340 Encephalopathy, unspecified: Secondary | ICD-10-CM | POA: Diagnosis present

## 2013-04-09 DIAGNOSIS — N750 Cyst of Bartholin's gland: Secondary | ICD-10-CM | POA: Diagnosis present

## 2013-04-09 LAB — BASIC METABOLIC PANEL
BUN: 3 mg/dL — ABNORMAL LOW (ref 6–23)
BUN: 3 mg/dL — ABNORMAL LOW (ref 6–23)
BUN: 3 mg/dL — ABNORMAL LOW (ref 6–23)
BUN: 3 mg/dL — ABNORMAL LOW (ref 6–23)
BUN: 3 mg/dL — ABNORMAL LOW (ref 6–23)
BUN: 3 mg/dL — ABNORMAL LOW (ref 6–23)
CO2: 18 mEq/L — ABNORMAL LOW (ref 19–32)
CO2: 19 mEq/L (ref 19–32)
CO2: 19 mEq/L (ref 19–32)
CO2: 19 mEq/L (ref 19–32)
CO2: 20 mEq/L (ref 19–32)
CO2: 20 mEq/L (ref 19–32)
Calcium: 8.3 mg/dL — ABNORMAL LOW (ref 8.4–10.5)
Calcium: 8.3 mg/dL — ABNORMAL LOW (ref 8.4–10.5)
Calcium: 8.3 mg/dL — ABNORMAL LOW (ref 8.4–10.5)
Calcium: 8.5 mg/dL (ref 8.4–10.5)
Calcium: 8.5 mg/dL (ref 8.4–10.5)
Calcium: 8.6 mg/dL (ref 8.4–10.5)
Chloride: 105 mEq/L (ref 96–112)
Chloride: 105 mEq/L (ref 96–112)
Chloride: 107 mEq/L (ref 96–112)
Chloride: 107 mEq/L (ref 96–112)
Chloride: 109 mEq/L (ref 96–112)
Chloride: 109 mEq/L (ref 96–112)
Creatinine, Ser: 0.54 mg/dL (ref 0.50–1.10)
Creatinine, Ser: 0.56 mg/dL (ref 0.50–1.10)
Creatinine, Ser: 0.63 mg/dL (ref 0.50–1.10)
Creatinine, Ser: 0.63 mg/dL (ref 0.50–1.10)
Creatinine, Ser: 0.65 mg/dL (ref 0.50–1.10)
Creatinine, Ser: 0.77 mg/dL (ref 0.50–1.10)
GFR calc Af Amer: >90 mL/min (ref >90)
GFR calc Af Amer: >90 mL/min (ref >90)
GFR calc Af Amer: >90 mL/min (ref >90)
GFR calc Af Amer: >90 mL/min (ref >90)
GFR calc Af Amer: >90 mL/min (ref >90)
GFR calc Af Amer: >90 mL/min (ref >90)
GFR calc non Af Amer: 87 mL/min — ABNORMAL LOW (ref >90)
GFR calc non Af Amer: >90 mL/min (ref >90)
GFR calc non Af Amer: >90 mL/min (ref >90)
GFR calc non Af Amer: >90 mL/min (ref >90)
GFR calc non Af Amer: >90 mL/min (ref >90)
GFR calc non Af Amer: >90 mL/min (ref >90)
Glucose, Bld: 132 mg/dL — ABNORMAL HIGH (ref 70–99)
Glucose, Bld: 159 mg/dL — ABNORMAL HIGH (ref 70–99)
Glucose, Bld: 180 mg/dL — ABNORMAL HIGH (ref 70–99)
Glucose, Bld: 196 mg/dL — ABNORMAL HIGH (ref 70–99)
Glucose, Bld: 213 mg/dL — ABNORMAL HIGH (ref 70–99)
Glucose, Bld: 215 mg/dL — ABNORMAL HIGH (ref 70–99)
Potassium: 2.9 mEq/L — CL (ref 3.7–5.3)
Potassium: 3 mEq/L — ABNORMAL LOW (ref 3.7–5.3)
Potassium: 3.1 mEq/L — ABNORMAL LOW (ref 3.7–5.3)
Potassium: 3.2 mEq/L — ABNORMAL LOW (ref 3.7–5.3)
Potassium: 3.3 mEq/L — ABNORMAL LOW (ref 3.7–5.3)
Potassium: 3.9 mEq/L (ref 3.7–5.3)
Sodium: 139 mEq/L (ref 137–147)
Sodium: 139 mEq/L (ref 137–147)
Sodium: 141 mEq/L (ref 137–147)
Sodium: 142 mEq/L (ref 137–147)
Sodium: 142 mEq/L (ref 137–147)
Sodium: 143 mEq/L (ref 137–147)

## 2013-04-09 LAB — GLUCOSE, CAPILLARY
Glucose-Capillary: 124 mg/dL — ABNORMAL HIGH (ref 70–99)
Glucose-Capillary: 129 mg/dL — ABNORMAL HIGH (ref 70–99)
Glucose-Capillary: 129 mg/dL — ABNORMAL HIGH (ref 70–99)
Glucose-Capillary: 138 mg/dL — ABNORMAL HIGH (ref 70–99)
Glucose-Capillary: 140 mg/dL — ABNORMAL HIGH (ref 70–99)
Glucose-Capillary: 152 mg/dL — ABNORMAL HIGH (ref 70–99)
Glucose-Capillary: 153 mg/dL — ABNORMAL HIGH (ref 70–99)
Glucose-Capillary: 156 mg/dL — ABNORMAL HIGH (ref 70–99)
Glucose-Capillary: 161 mg/dL — ABNORMAL HIGH (ref 70–99)
Glucose-Capillary: 164 mg/dL — ABNORMAL HIGH (ref 70–99)
Glucose-Capillary: 164 mg/dL — ABNORMAL HIGH (ref 70–99)
Glucose-Capillary: 166 mg/dL — ABNORMAL HIGH (ref 70–99)
Glucose-Capillary: 173 mg/dL — ABNORMAL HIGH (ref 70–99)
Glucose-Capillary: 181 mg/dL — ABNORMAL HIGH (ref 70–99)
Glucose-Capillary: 183 mg/dL — ABNORMAL HIGH (ref 70–99)
Glucose-Capillary: 185 mg/dL — ABNORMAL HIGH (ref 70–99)
Glucose-Capillary: 186 mg/dL — ABNORMAL HIGH (ref 70–99)
Glucose-Capillary: 188 mg/dL — ABNORMAL HIGH (ref 70–99)
Glucose-Capillary: 189 mg/dL — ABNORMAL HIGH (ref 70–99)
Glucose-Capillary: 195 mg/dL — ABNORMAL HIGH (ref 70–99)
Glucose-Capillary: 197 mg/dL — ABNORMAL HIGH (ref 70–99)
Glucose-Capillary: 199 mg/dL — ABNORMAL HIGH (ref 70–99)
Glucose-Capillary: 205 mg/dL — ABNORMAL HIGH (ref 70–99)

## 2013-04-09 MED ORDER — SODIUM CHLORIDE 0.9 % IV SOLN
INTRAVENOUS | Status: DC
  Administered 2013-04-09: 4.2 [IU]/h via INTRAVENOUS
  Administered 2013-04-09: 2.1 [IU]/h via INTRAVENOUS
  Administered 2013-04-09: 6.2 [IU]/h via INTRAVENOUS
  Administered 2013-04-09: 4.8 [IU]/h via INTRAVENOUS
  Administered 2013-04-09: 1.3 [IU]/h via INTRAVENOUS
  Administered 2013-04-09: 3.9 [IU]/h via INTRAVENOUS
  Administered 2013-04-09: 1.3 [IU]/h via INTRAVENOUS
  Administered 2013-04-09: 4.2 [IU]/h via INTRAVENOUS
  Administered 2013-04-09: 5 [IU]/h via INTRAVENOUS
  Administered 2013-04-09 – 2013-04-10 (×2): 2.7 [IU]/h via INTRAVENOUS
  Administered 2013-04-10: 3.4 [IU]/h via INTRAVENOUS
  Filled 2013-04-09: qty 1

## 2013-04-09 MED ORDER — METOPROLOL SUCCINATE 12.5 MG HALF TABLET
12.5000 mg | ORAL_TABLET | Freq: Two times a day (BID) | ORAL | Status: DC
  Administered 2013-04-09 (×2): 12.5 mg via ORAL
  Filled 2013-04-09 (×4): qty 1

## 2013-04-09 MED ORDER — LEVOTHYROXINE SODIUM 75 MCG PO TABS
75.0000 ug | ORAL_TABLET | Freq: Every day | ORAL | Status: DC
  Filled 2013-04-09: qty 1

## 2013-04-09 MED ORDER — DULOXETINE HCL 60 MG PO CPEP
60.0000 mg | ORAL_CAPSULE | Freq: Every day | ORAL | Status: DC
  Administered 2013-04-09 – 2013-04-12 (×4): 60 mg via ORAL
  Filled 2013-04-09 (×4): qty 1

## 2013-04-09 MED ORDER — INSULIN DETEMIR 100 UNIT/ML FLEXPEN: 100.0000 [IU] | PEN_INJECTOR | SUBCUTANEOUS | Status: DC

## 2013-04-09 MED ORDER — SERTRALINE HCL 50 MG PO TABS
150.0000 mg | ORAL_TABLET | Freq: Every day | ORAL | Status: DC
  Administered 2013-04-09 – 2013-04-10 (×2): 150 mg via ORAL
  Administered 2013-04-11: 50 mg via ORAL
  Administered 2013-04-12: 150 mg via ORAL
  Filled 2013-04-09 (×4): qty 1

## 2013-04-09 MED ORDER — POTASSIUM CHLORIDE CRYS ER 20 MEQ PO TBCR
40.0000 meq | EXTENDED_RELEASE_TABLET | Freq: Once | ORAL | Status: AC
  Administered 2013-04-09: 40 meq via ORAL
  Filled 2013-04-09: qty 2

## 2013-04-09 MED ORDER — GI COCKTAIL ~~LOC~~
30.0000 mL | Freq: Three times a day (TID) | ORAL | Status: DC | PRN
  Filled 2013-04-09: qty 30

## 2013-04-09 MED ORDER — LORAZEPAM 1 MG PO TABS
1.0000 mg | ORAL_TABLET | Freq: Three times a day (TID) | ORAL | Status: DC | PRN
  Administered 2013-04-09: 1 mg via ORAL
  Filled 2013-04-09: qty 1

## 2013-04-09 MED ORDER — CYCLOBENZAPRINE HCL 5 MG PO TABS: 5.0000 mg | ORAL_TABLET | Freq: Three times a day (TID) | ORAL | Status: DC | PRN

## 2013-04-09 MED ORDER — POTASSIUM CHLORIDE CRYS ER 20 MEQ PO TBCR: 40.0000 meq | EXTENDED_RELEASE_TABLET | Freq: Once | ORAL | Status: DC

## 2013-04-09 MED ORDER — INSULIN DETEMIR 100 UNIT/ML ~~LOC~~ SOLN
100.0000 [IU] | Freq: Once | SUBCUTANEOUS | Status: AC
  Administered 2013-04-10: 100 [IU] via SUBCUTANEOUS
  Filled 2013-04-09: qty 1

## 2013-04-09 MED ORDER — PRIMIDONE 50 MG PO TABS
150.0000 mg | ORAL_TABLET | Freq: Two times a day (BID) | ORAL | Status: DC
  Administered 2013-04-09 – 2013-04-12 (×7): 150 mg via ORAL
  Filled 2013-04-09 (×8): qty 3

## 2013-04-09 MED ORDER — KCL IN DEXTROSE-NACL 20-5-0.9 MEQ/L-%-% IV SOLN
INTRAVENOUS | Status: DC
  Administered 2013-04-09 (×2): via INTRAVENOUS
  Filled 2013-04-09 (×4): qty 1000

## 2013-04-09 NOTE — Progress Notes (Signed)
Chest pain without nausea or vomitting. Vitals stable. 12 lead EKG done. Dr Wynelle Cleveland made aware. GI cocktail given as ordered and patient felt better , pain free. Will continue to monitor. Patient sinus rhythm on the monitor.

## 2013-04-09 NOTE — Progress Notes (Signed)
ANTIBIOTIC CONSULT NOTE - Follow-up  Pharmacy Consult for Vancomycin and Zosyn  Indication: rule out sepsis  Allergies  Allergen Reactions  . Metformin And Related     diarrhea  . Guaifenesin     MUCINEX REACTION: asthma attacks  . Oxycodone-Acetaminophen     TYLOX.  . Guaifenesin Er     Patient Measurements: Height: 5\' 7"  (170.2 cm) Weight: 175 lb 11.3 oz (79.7 kg) IBW/kg (Calculated) : 61.6  Vital Signs: Temp: 98.2 F (36.8 C) (01/04 0733) Temp src: Oral (01/04 0733) BP: 146/75 mmHg (01/04 0900) Pulse Rate: 96 (01/04 0800) Intake/Output from previous day: 01/03 0701 - 01/04 0700 In: 1411.2 [I.V.:773.7; IV Piggyback:637.5] Out: 1575 [STMHD:6222] Intake/Output from this shift: Total I/O In: 6.5 [I.V.:6.5] Out: 300 [Urine:300]  Labs:  Recent Labs  04/07/13 0228  04/08/13 2349 04/09/13 0227 04/09/13 0818  WBC 10.5  --   --   --   --   HGB 14.0  --   --   --   --   PLT 163  --   --   --   --   CREATININE 0.72  < > 0.56 0.54 0.65  < > = values in this interval not displayed. Estimated Creatinine Clearance: 77.2 ml/min (by C-G formula based on Cr of 0.65). No results found for this basename: VANCOTROUGH, Corlis Leak, VANCORANDOM, [place], GENTPEAK, [place], [place], TOBRAPEAK, TOBRARND, AMIKACINPEAK, AMIKACINTROU, AMIKACIN,  in the last 72 hours   Microbiology: Recent Results (from the past 720 hour(s))  CULTURE, BLOOD (ROUTINE X 2)     Status: None   Collection Time    04/05/13 11:15 PM      Result Value Range Status   Specimen Description BLOOD LEFT HAND   Final   Special Requests BOTTLES DRAWN AEROBIC AND ANAEROBIC 5CC   Final   Culture NO GROWTH 4 DAYS   Final   Report Status PENDING   Incomplete  CULTURE, BLOOD (ROUTINE X 2)     Status: None   Collection Time    04/05/13 11:34 PM      Result Value Range Status   Specimen Description BLOOD LEFT HAND   Final   Special Requests BOTTLES DRAWN AEROBIC AND ANAEROBIC 5CC   Final   Culture NO GROWTH  4 DAYS   Final   Report Status PENDING   Incomplete  MRSA PCR SCREENING     Status: Abnormal   Collection Time    04/06/13  3:11 AM      Result Value Range Status   MRSA by PCR POSITIVE (*) NEGATIVE Final   Comment:            The GeneXpert MRSA Assay (FDA     approved for NASAL specimens     only), is one component of a     comprehensive MRSA colonization     surveillance program. It is not     intended to diagnose MRSA     infection nor to guide or     monitor treatment for     MRSA infections.     RESULT CALLED TO, READ BACK BY AND VERIFIED WITH:     TURNER,D RN [phone] AT [id number] SKEEN,P   Assessment: 61 yof admitted with DKA and AMS s/p Bartholin cyst I&D. She continues on D#4 of broad-spectrum antibiotics including zosyn and vancomycin. Pt is afebrile and WBC is WNL. PCR is undetectable and pt reports feeling much better. Cultures remains negative.   Vanc 1/1>> Zosyn 1/1>>  12/31 Blood -  NGTD 1/1 MRSA - POS  Goal of Therapy:  Vancomycin trough level 15-20 mcg/ml  Plan:  1. Continue vanc and zosyn as ordered - MD please consider de-escalating therapy 2. F/u renal fxn, C&S, clinical status and trough at SS 3. Will check a vancomycin trough tonight if continued  Salome Arnt, PharmD, BCPS Pager # [phone] 04/09/2013 11:09 AM

## 2013-04-09 NOTE — Progress Notes (Addendum)
[facility] Progress Note [place] TEAM 1 - Stepdown/ICU TEAM   [name] [id number]:[id number] DOB: 22-Mar-1951 DOA: 04/05/2013 PCP: Scarlette Calico, MD  Brief narrative: 63 y/o female with hx of DM had Bartholin cyst I&D w/o event and d/c home. Later husband reports shaking with progressive change in mental status unclear if seizure as pt with bleeding tongue but no loss of conscious per husband. Blood pressures fluctuated in elevation with max 200/131 recorded thus PRES in differential. Pt brought to [facility] ER and was noted to have elevated blood sugar with concern for DKA. She was transferred to [facility] for further assessment.    Subjective: No significant complaints  Assessment/Plan: Principal Problem:   DKA (diabetic ketoacidoses) - Insulin infusion needed to be resumed last night- cont to follow Bmet for resolution of acidosis - will resume detemir at 100 U today   Active Problems:   TREMOR, ESSENTIAL - resume Mysoline    ESSENTIAL HYPERTENSION - hold diuretics    Hypothyroidism - resume synthroid    Bartholin's cyst - I and D on 12/31 by ER - d/c Vanc- cont Zosyn for now    Acute encephalopathy - head CT negative- EEG reveals mild encepahlopathy - Neuro consulted - improving able to remember her meds- does not recall having I and D of cyst and instead believes it drained on its own (records from ER reveal that I and D was done)    Code Status: Full  Family Communication: none Disposition Plan: to be determined  Consultants: PCCM  Procedures: 12/31- I and D of Bartholins cyst  Antibiotics: Zosyn 01/01 >>>  Vancomycin 01/01 >>>   DVT prophylaxis: Heparin  Objective: Blood pressure 176/109, pulse 107, temperature 98 F (36.7 C), temperature source Oral, resp. rate 25, height 5\' 7"  (1.702 m), weight 79.7 kg (175 lb 11.3 oz), SpO2 100.00%.  Intake/Output Summary (Last 24 hours) at 04/09/13 1757 Last data filed at 04/09/13 1500  Gross per 24 hour   Intake 1090.75 ml  Output   1650 ml  Net -559.25 ml     Exam: General: No acute respiratory distress- tremors of head, hands and arms Lungs: Clear to auscultation bilaterally without wheezes or crackles Cardiovascular: Regular rate and rhythm without murmur gallop or rub normal S1 and S2 Abdomen: Nontender, nondistended, soft, bowel sounds positive, no rebound, no ascites, no appreciable mass Extremities: No significant cyanosis, clubbing, or edema bilateral lower extremities Genitals: no signs of residual cyst- candida dermatitis in groin noted   Data Reviewed: Basic Metabolic Panel:  Recent Labs Lab 04/05/13 2200 04/05/13 2225  04/06/13 1928 04/07/13 0228  04/08/13 2349 04/09/13 0227 04/09/13 0818 04/09/13 1208 04/09/13 1519  NA 136*  --   < > 140 140  < > 139 139 143 141 142  K 4.0  --   < > 3.1* 4.1  < > 3.3* 3.9 3.1* 2.9* 3.2*  CL 93*  --   < > 109 108  < > 105 105 109 107 107  CO2 15*  --   < > 19 19  < > 18* 19 19 19 20   GLUCOSE 532*  --   < > 147* 124*  < > 213* 215* 132* 196* 180*  BUN 12  --   < > 7 7  < > 3* 3* 3* 3* 3*  CREATININE 0.62  --   < > 0.68 0.72  < > 0.56 0.54 0.65 0.63 0.63  CALCIUM 9.4  --   < >  8.6 8.7  < > 8.3* 8.3* 8.5 8.3* 8.6  MG  --  1.8  --   --  1.6  --   --   --   --   --   --   PHOS  --   --   --   --  2.0*  --   --   --   --   --   --   < > = values in this interval not displayed. Liver Function Tests:  Recent Labs Lab 04/05/13 2200 04/07/13 0228  AST 12 27  ALT 10 10  ALKPHOS 144* 100  BILITOT 0.4 0.6  PROT 8.2 6.6  ALBUMIN 4.4 3.4*   No results found for this basename: LIPASE, AMYLASE,  in the last 168 hours No results found for this basename: AMMONIA,  in the last 168 hours CBC:  Recent Labs Lab 04/05/13 2225 04/06/13 0535 04/07/13 0228  WBC 15.0* 14.1* 10.5  NEUTROABS 12.5*  --   --   HGB 14.5 13.1 14.0  HCT 41.8 37.3 39.2  MCV 86.7 86.5 86.2  PLT 237 202 163   Cardiac Enzymes:  Recent Labs Lab  04/05/13 2225  TROPONINI <0.30   BNP (last 3 results)  Recent Labs  12/04/12 1659  PROBNP 411.3*   CBG:  Recent Labs Lab 04/09/13 1238 04/09/13 1335 04/09/13 1449 04/09/13 1612 04/09/13 1724  GLUCAP 185* 153* 183* 173* 156*    Recent Results (from the past 240 hour(s))  CULTURE, BLOOD (ROUTINE X 2)     Status: None   Collection Time    04/05/13 11:15 PM      Result Value Range Status   Specimen Description BLOOD LEFT HAND   Final   Special Requests BOTTLES DRAWN AEROBIC AND ANAEROBIC 5CC   Final   Culture NO GROWTH 4 DAYS   Final   Report Status PENDING   Incomplete  CULTURE, BLOOD (ROUTINE X 2)     Status: None   Collection Time    04/05/13 11:34 PM      Result Value Range Status   Specimen Description BLOOD LEFT HAND   Final   Special Requests BOTTLES DRAWN AEROBIC AND ANAEROBIC 5CC   Final   Culture NO GROWTH 4 DAYS   Final   Report Status PENDING   Incomplete  MRSA PCR SCREENING     Status: Abnormal   Collection Time    04/06/13  3:11 AM      Result Value Range Status   MRSA by PCR POSITIVE (*) NEGATIVE Final   Comment:            The GeneXpert MRSA Assay (FDA     approved for NASAL specimens     only), is one component of a     comprehensive MRSA colonization     surveillance program. It is not     intended to diagnose MRSA     infection nor to guide or     monitor treatment for     MRSA infections.     RESULT CALLED TO, READ BACK BY AND VERIFIED WITH:     TURNER,D RN [phone] AT [id number] SKEEN,P     Studies:  Recent x-ray studies have been reviewed in detail by the Attending Physician  Scheduled Meds:  Scheduled Meds: . aspirin  325 mg Oral Daily  . Chlorhexidine Gluconate Cloth  6 each Topical Q0600  . DULoxetine  60 mg Oral Daily  . heparin  5,000 Units  Subcutaneous Q8H  . levothyroxine  75 mcg Oral QAC breakfast  . metoprolol succinate  12.5 mg Oral BID  . mupirocin ointment  1 application Nasal BID  . piperacillin-tazobactam (ZOSYN)  IV   3.375 g Intravenous Q8H  . primidone  150 mg Oral BID  . sertraline  150 mg Oral Daily  . vancomycin  1,250 mg Intravenous Q12H   Continuous Infusions: . dextrose 5 % and 0.9 % NaCl with KCl 20 mEq/L 100 mL/hr at 04/09/13 1140  . insulin (NOVOLIN-R) infusion 4.8 Units/hr (04/09/13 1726)    Time spent on care of this patient: >35 min   Debbe Odea, MD  [facility] Office  [phone] Pager - Text Page per Amion as per below:  On-Call/Text Page:      [email or web address]      password [email or web address]  If 7PM-7AM, please contact night-coverage [email or web address] Password [email or web address] 04/09/2013, 5:57 PM   LOS: 4 days

## 2013-04-10 LAB — BASIC METABOLIC PANEL
BUN: 3 mg/dL — ABNORMAL LOW (ref 6–23)
BUN: 3 mg/dL — ABNORMAL LOW (ref 6–23)
CO2: 21 mEq/L (ref 19–32)
CO2: 21 mEq/L (ref 19–32)
Calcium: 8.7 mg/dL (ref 8.4–10.5)
Calcium: 8.7 mg/dL (ref 8.4–10.5)
Chloride: 111 mEq/L (ref 96–112)
Chloride: 112 mEq/L (ref 96–112)
Creatinine, Ser: 0.88 mg/dL (ref 0.50–1.10)
Creatinine, Ser: 1 mg/dL (ref 0.50–1.10)
GFR calc Af Amer: 67 mL/min — ABNORMAL LOW (ref >90)
GFR calc Af Amer: 79 mL/min — ABNORMAL LOW (ref >90)
GFR calc non Af Amer: 58 mL/min — ABNORMAL LOW (ref >90)
GFR calc non Af Amer: 68 mL/min — ABNORMAL LOW (ref >90)
Glucose, Bld: 136 mg/dL — ABNORMAL HIGH (ref 70–99)
Glucose, Bld: 171 mg/dL — ABNORMAL HIGH (ref 70–99)
Potassium: 3.2 mEq/L — ABNORMAL LOW (ref 3.7–5.3)
Potassium: 3.2 mEq/L — ABNORMAL LOW (ref 3.7–5.3)
Sodium: 147 mEq/L (ref 137–147)
Sodium: 147 mEq/L (ref 137–147)

## 2013-04-10 LAB — CULTURE, BLOOD (ROUTINE X 2)
Culture: NO GROWTH
Culture: NO GROWTH

## 2013-04-10 LAB — GLUCOSE, CAPILLARY
Glucose-Capillary: 104 mg/dL — ABNORMAL HIGH (ref 70–99)
Glucose-Capillary: 127 mg/dL — ABNORMAL HIGH (ref 70–99)
Glucose-Capillary: 145 mg/dL — ABNORMAL HIGH (ref 70–99)
Glucose-Capillary: 146 mg/dL — ABNORMAL HIGH (ref 70–99)
Glucose-Capillary: 147 mg/dL — ABNORMAL HIGH (ref 70–99)
Glucose-Capillary: 177 mg/dL — ABNORMAL HIGH (ref 70–99)
Glucose-Capillary: 196 mg/dL — ABNORMAL HIGH (ref 70–99)
Glucose-Capillary: 225 mg/dL — ABNORMAL HIGH (ref 70–99)
Glucose-Capillary: 59 mg/dL — ABNORMAL LOW (ref 70–99)
Glucose-Capillary: 81 mg/dL (ref 70–99)

## 2013-04-10 MED ORDER — DEXTROSE 10 % IV SOLN
INTRAVENOUS | Status: DC | PRN
Start: 2013-04-10 — End: 2013-04-10

## 2013-04-10 MED ORDER — INSULIN ASPART 100 UNIT/ML ~~LOC~~ SOLN
10.0000 [IU] | Freq: Three times a day (TID) | SUBCUTANEOUS | Status: DC
  Administered 2013-04-10: 10 [IU] via SUBCUTANEOUS

## 2013-04-10 MED ORDER — METOPROLOL SUCCINATE ER 25 MG PO TB24
25.0000 mg | ORAL_TABLET | Freq: Two times a day (BID) | ORAL | Status: DC
  Administered 2013-04-10 – 2013-04-11 (×3): 25 mg via ORAL
  Filled 2013-04-10 (×4): qty 1

## 2013-04-10 MED ORDER — INSULIN GLARGINE 100 UNIT/ML ~~LOC~~ SOLN
35.0000 [IU] | SUBCUTANEOUS | Status: DC
  Administered 2013-04-10: 35 [IU] via SUBCUTANEOUS
  Filled 2013-04-10: qty 0.35

## 2013-04-10 MED ORDER — INSULIN ASPART 100 UNIT/ML ~~LOC~~ SOLN
6.0000 [IU] | Freq: Three times a day (TID) | SUBCUTANEOUS | Status: DC
  Administered 2013-04-11 – 2013-04-12 (×5): 6 [IU] via SUBCUTANEOUS

## 2013-04-10 MED ORDER — INSULIN ASPART 100 UNIT/ML ~~LOC~~ SOLN
2.0000 [IU] | SUBCUTANEOUS | Status: DC
  Administered 2013-04-10: 6 [IU] via SUBCUTANEOUS
  Administered 2013-04-10: 4 [IU] via SUBCUTANEOUS

## 2013-04-10 MED ORDER — INSULIN DETEMIR 100 UNIT/ML ~~LOC~~ SOLN
160.0000 [IU] | Freq: Every day | SUBCUTANEOUS | Status: DC
  Administered 2013-04-11 – 2013-04-12 (×2): 160 [IU] via SUBCUTANEOUS
  Filled 2013-04-10 (×2): qty 1.6

## 2013-04-10 MED ORDER — INSULIN ASPART 100 UNIT/ML ~~LOC~~ SOLN
0.0000 [IU] | Freq: Three times a day (TID) | SUBCUTANEOUS | Status: DC
  Administered 2013-04-10: 3 [IU] via SUBCUTANEOUS
  Administered 2013-04-11: 7 [IU] via SUBCUTANEOUS
  Administered 2013-04-11: 4 [IU] via SUBCUTANEOUS
  Administered 2013-04-12: 11 [IU] via SUBCUTANEOUS
  Administered 2013-04-12: 3 [IU] via SUBCUTANEOUS

## 2013-04-10 MED ORDER — POTASSIUM CHLORIDE CRYS ER 20 MEQ PO TBCR
40.0000 meq | EXTENDED_RELEASE_TABLET | Freq: Two times a day (BID) | ORAL | Status: AC
  Administered 2013-04-10 – 2013-04-11 (×3): 40 meq via ORAL
  Filled 2013-04-10 (×3): qty 2

## 2013-04-10 NOTE — Progress Notes (Signed)
Hypoglycemic Event  CBG: 59 at 1553  Treatment: 15 GM carbohydrate snack  Symptoms: Pale and Hungry  Follow-up CBG: Time:1608 CBG Result:81  Possible Reasons for Event: Medication regimen: Change in insulin requirements and Change in activity  Comments/MD notified:Dr. Thereasa Solo notified, Medication adjustments made     [name], [name]  Remember to initiate Hypoglycemia Order Set & complete

## 2013-04-10 NOTE — Progress Notes (Addendum)
[facility] Progress Note  TEAM 1 - Stepdown/ICU TEAM   MANDI MATTIOLI QMV:[id number] DOB: 01/22/1951 DOA: 04/05/2013 PCP: Scarlette Calico, MD  Brief narrative: 63 y/o female with hx of DM who had a Bartholin cyst I&D w/o event and was d/c home. Later her husband reported shaking with progressive change in mental status (unclear if seizure as pt with bleeding tongue but no loss of conscious per husband). Blood pressure was noted to be markedly elevated at 200/131 thus PRES in differential. Pt was taken to [place] and was noted to have elevated blood sugar with concern for DKA. She was transferred to [facility] for further assessment.   SIGNIFICANT EVENTS:  12/31 Underwent Bartholin cyst I&D and returned with acute encephalopathy and DKA 12/31 Head CT >>> nad  1/01 Transferred to [facility], neuro consulted   Subjective: Pt is alert and conversant.  She denies n/v, abdom pain, f/c, cp, sob, or ha.  She is hungry.    Assessment/Plan:  DKA in severely uncontrolled DM had run out of her insulin x 3 days at time of presentation - has been on/off insulin gtt while in hospital - has very high insulin requirement at home (reportedly 180 of levemir daily) - CBG is climbing again, but bicarb remains normal - pt admits to her "usual" CBG at home being "around 250"  HYPERTENSION BP not at goal - adjust tx and follow trend  Hypothyroidism Cont synthroid  Bartholin's cyst I and D on 12/31 by ER - no wound culture in system - wound essentialy healed on exam today - will stop abx and follow clinically   Acute encephalopathy head CT negative - EEG revealed mild encephalopathy - Neuro has seen - improving steadily   Hypomagnesemia  Recheck in AM  Hypokalemia Cont to replace today and follow trend   Hx of AV nodal re-entry tachycardia Stable at present  H/o vocal cord dysfunction vs/ asthma Stable at present  TREMOR, ESSENTIAL Mysoline  MRSA screen + Contact precautions  Code  Status: FULL Family Communication: none Disposition Plan: stable for transfer to medical bed - follow CBG very closely   Consultants: PCCM >> TRH  Antibiotics: Zosyn 1/01 >>> 1/5 Vancomycin 1/01 >>> 1/4  DVT prophylaxis: SQ Heparin  Objective: Blood pressure 141/80, pulse 100, temperature 98.1 F (36.7 C), temperature source Oral, resp. rate 22, height 5\' 7"  (1.702 m), weight 79.6 kg (175 lb 7.8 oz), SpO2 100.00%.  Intake/Output Summary (Last 24 hours) at 04/10/13 0943 Last data filed at 04/10/13 0900  Gross per 24 hour  Intake 2728.57 ml  Output    350 ml  Net 2378.57 ml   Exam: General: No acute respiratory distress - resting tremors of head, hands and arms Lungs: Clear to auscultation bilaterally without wheezes or crackles Cardiovascular: Regular rate and rhythm without murmur gallop or rub normal S1 and S2 Abdomen: Nontender, nondistended, soft, bowel sounds positive, no rebound, no ascites, no appreciable mass Extremities: No significant cyanosis, clubbing, or edema bilateral lower extremities Genitals: no signs of residual cyst with no erythema or drainage  Data Reviewed: Basic Metabolic Panel:  Recent Labs Lab 04/05/13 2200 04/05/13 2225  04/06/13 1928 04/07/13 0228  04/09/13 1208 04/09/13 1519 04/09/13 1825 04/09/13 2330 04/10/13 0350  NA 136*  --   < > 140 140  < > 141 142 142 147 147  K 4.0  --   < > 3.1* 4.1  < > 2.9* 3.2* 3.0* 3.2* 3.2*  CL 93*  --   < >  109 108  < > 107 107 109 112 111  CO2 15*  --   < > 19 19  < > 19 20 20 21 21   GLUCOSE 532*  --   < > 147* 124*  < > 196* 180* 159* 136* 171*  BUN 12  --   < > 7 7  < > 3* 3* 3* 3* 3*  CREATININE 0.62  --   < > 0.68 0.72  < > 0.63 0.63 0.77 0.88 1.00  CALCIUM 9.4  --   < > 8.6 8.7  < > 8.3* 8.6 8.5 8.7 8.7  MG  --  1.8  --   --  1.6  --   --   --   --   --   --   PHOS  --   --   --   --  2.0*  --   --   --   --   --   --   < > = values in this interval not displayed.  Liver Function  Tests:  Recent Labs Lab 04/05/13 2200 04/07/13 0228  AST 12 27  ALT 10 10  ALKPHOS 144* 100  BILITOT 0.4 0.6  PROT 8.2 6.6  ALBUMIN 4.4 3.4*   CBC:  Recent Labs Lab 04/05/13 2225 04/06/13 0535 04/07/13 0228  WBC 15.0* 14.1* 10.5  NEUTROABS 12.5*  --   --   HGB 14.5 13.1 14.0  HCT 41.8 37.3 39.2  MCV 86.7 86.5 86.2  PLT 237 202 163   CBG:  Recent Labs Lab 04/10/13 0153 04/10/13 0259 04/10/13 0413 04/10/13 0546 04/10/13 0820  GLUCAP 145* 147* 196* 177* 225*    Recent Results (from the past 240 hour(s))  CULTURE, BLOOD (ROUTINE X 2)     Status: None   Collection Time    04/05/13 11:15 PM      Result Value Range Status   Specimen Description BLOOD LEFT HAND   Final   Special Requests BOTTLES DRAWN AEROBIC AND ANAEROBIC 5CC   Final   Culture NO GROWTH 4 DAYS   Final   Report Status PENDING   Incomplete  CULTURE, BLOOD (ROUTINE X 2)     Status: None   Collection Time    04/05/13 11:34 PM      Result Value Range Status   Specimen Description BLOOD LEFT HAND   Final   Special Requests BOTTLES DRAWN AEROBIC AND ANAEROBIC 5CC   Final   Culture NO GROWTH 4 DAYS   Final   Report Status PENDING   Incomplete  MRSA PCR SCREENING     Status: Abnormal   Collection Time    04/06/13  3:11 AM      Result Value Range Status   MRSA by PCR POSITIVE (*) NEGATIVE Final   Comment:            The GeneXpert MRSA Assay (FDA     approved for NASAL specimens     only), is one component of a     comprehensive MRSA colonization     surveillance program. It is not     intended to diagnose MRSA     infection nor to guide or     monitor treatment for     MRSA infections.     RESULT CALLED TO, READ BACK BY AND VERIFIED WITH:     TURNER,D RN [phone] AT [id number] SKEEN,P     Studies:  Recent x-ray studies have been reviewed in detail  by the Attending Physician  Scheduled Meds:  Scheduled Meds: . aspirin  325 mg Oral Daily  . DULoxetine  60 mg Oral Daily  . heparin  5,000 Units  Subcutaneous Q8H  . insulin aspart  0-20 Units Subcutaneous TID WC  . insulin aspart  10 Units Subcutaneous TID WC  . [START ON 04/11/2013] insulin detemir  160 Units Subcutaneous Daily  . levothyroxine  75 mcg Oral QAC breakfast  . metoprolol succinate  12.5 mg Oral BID  . mupirocin ointment  1 application Nasal BID  . piperacillin-tazobactam (ZOSYN)  IV  3.375 g Intravenous Q8H  . potassium chloride  40 mEq Oral Once  . primidone  150 mg Oral BID  . sertraline  150 mg Oral Daily    Time spent on care of this patient: >35 min   Cherene Altes, MD  [facility]  Office  [phone] Pager - Text Page per Shea Evans as per below:  On-Call/Text Page:      [email or web address]      password [email or web address]  If 7PM-7AM, please contact night-coverage [email or web address] Password [email or web address] 04/10/2013, 9:43 AM   LOS: 5 days

## 2013-04-11 DIAGNOSIS — N179 Acute kidney failure, unspecified: Secondary | ICD-10-CM

## 2013-04-11 LAB — COMPREHENSIVE METABOLIC PANEL
ALT: 11 U/L (ref 0–35)
AST: 14 U/L (ref 0–37)
Albumin: 3 g/dL — ABNORMAL LOW (ref 3.5–5.2)
Alkaline Phosphatase: 93 U/L (ref 39–117)
BUN: 8 mg/dL (ref 6–23)
CO2: 21 mEq/L (ref 19–32)
Calcium: 8.2 mg/dL — ABNORMAL LOW (ref 8.4–10.5)
Chloride: 105 mEq/L (ref 96–112)
Creatinine, Ser: 1.32 mg/dL — ABNORMAL HIGH (ref 0.50–1.10)
GFR calc Af Amer: 48 mL/min — ABNORMAL LOW (ref >90)
GFR calc non Af Amer: 42 mL/min — ABNORMAL LOW (ref >90)
Glucose, Bld: 145 mg/dL — ABNORMAL HIGH (ref 70–99)
Potassium: 3.7 mEq/L (ref 3.7–5.3)
Sodium: 140 mEq/L (ref 137–147)
Total Bilirubin: 0.3 mg/dL (ref 0.3–1.2)
Total Protein: 6.2 g/dL (ref 6.0–8.3)

## 2013-04-11 LAB — CBC
HCT: 37.2 % (ref 36.0–46.0)
Hemoglobin: 13 g/dL (ref 12.0–15.0)
MCH: 30.2 pg (ref 26.0–34.0)
MCHC: 34.9 g/dL (ref 30.0–36.0)
MCV: 86.5 fL (ref 78.0–100.0)
Platelets: 185 10*3/uL (ref 150–400)
RBC: 4.3 MIL/uL (ref 3.87–5.11)
RDW: 13.4 % (ref 11.5–15.5)
WBC: 9.6 10*3/uL (ref 4.0–10.5)

## 2013-04-11 LAB — MAGNESIUM: Magnesium: 1.5 mg/dL (ref 1.5–2.5)

## 2013-04-11 LAB — GLUCOSE, CAPILLARY
Glucose-Capillary: 100 mg/dL — ABNORMAL HIGH (ref 70–99)
Glucose-Capillary: 115 mg/dL — ABNORMAL HIGH (ref 70–99)
Glucose-Capillary: 128 mg/dL — ABNORMAL HIGH (ref 70–99)
Glucose-Capillary: 161 mg/dL — ABNORMAL HIGH (ref 70–99)
Glucose-Capillary: 183 mg/dL — ABNORMAL HIGH (ref 70–99)

## 2013-04-11 MED ORDER — ASPIRIN EC 81 MG PO TBEC: 81.0000 mg | DELAYED_RELEASE_TABLET | Freq: Every morning | ORAL | Status: DC

## 2013-04-11 MED ORDER — FLUTICASONE PROPIONATE 50 MCG/ACT NA SUSP
1.0000 | Freq: Every day | NASAL | Status: DC
  Administered 2013-04-11 – 2013-04-12 (×2): 1 via NASAL
  Filled 2013-04-11: qty 16

## 2013-04-11 MED ORDER — MAGNESIUM SULFATE 40 MG/ML IJ SOLN
2.0000 g | Freq: Once | INTRAMUSCULAR | Status: AC
  Administered 2013-04-11: 2 g via INTRAVENOUS
  Filled 2013-04-11: qty 50

## 2013-04-11 MED ORDER — METOPROLOL SUCCINATE ER 50 MG PO TB24: 50.0000 mg | ORAL_TABLET | Freq: Two times a day (BID) | ORAL | Status: DC

## 2013-04-11 MED ORDER — METOPROLOL SUCCINATE ER 25 MG PO TB24
25.0000 mg | ORAL_TABLET | Freq: Two times a day (BID) | ORAL | Status: DC
  Administered 2013-04-11 – 2013-04-12 (×2): 25 mg via ORAL
  Filled 2013-04-11 (×4): qty 1

## 2013-04-11 NOTE — Progress Notes (Addendum)
[facility] Progress Note [place] TEAM 1 - Stepdown/ICU TEAM   [name] [id number] DOB: [birth year] DOA: 04/05/2013 PCP: Scarlette Calico, MD  Brief narrative: 63 y/o female with hx of DM had Bartholin cyst I&D w/o event and d/c home. Later husband reports shaking with progressive change in mental status unclear if seizure as pt with bleeding tongue but no loss of conscious per husband. Blood pressures fluctuated in elevation with max 200/131 recorded thus PRES in differential. Pt brought to [facility] ER and was noted to have elevated blood sugar with concern for DKA. She was transferred to [facility] for further assessment.    Subjective: No significant complaints- discussed need for Novolog upon discharge - she is agreeable to it.   Assessment/Plan: Principal Problem:   DKA (diabetic ketoacidoses) with uncontrolled DM - last A1c 9/5 on 01/18/13 - DKA recurred after initial d/c of insulin infusion- now off on insulin infusion- - Detemir resumed at 160 U (take 180 at home) -also administering Novolog with meals now - pt willing to go home with Novolog pre-meal - sugars well controlled thus far on current doses  Active Problems:  ARF - Cr noted to go up today - does not appear to be pre-renal - no offeding meds noted - recheck in AM    TREMOR, ESSENTIAL - resume Mysoline    ESSENTIAL HYPERTENSION - hold diuretics    Hypothyroidism - resume synthroid    Bartholin's cyst - I and D on 12/31 by ER - d/c Lucianne Lei 1/4 - d/c'd Zosyn yesterday - no evidence of recurrence    Acute encephalopathy - head CT negative- EEG reveals mild encepahlopathy - Neuro consulted - improving able to remember her meds- does not recall having I and D of cyst and instead believes it drained on its own (records from ER reveal that I and D was done)  Hypokalemia - Has borderline K+ at 3.5 therefore will give another dose of KCL today  Hypomagnesemia  - also borderline- replace and recheck  in AM  Hx of AV nodal re-entry tachycardia  Stable at present   H/o vocal cord dysfunction vs/ asthma  Stable at present  MRSA screen +  Contact precautions  Depression - taking both Cymbalta and Zoloft per her psychiatrist   Code Status: Full  Family Communication: discussed with husband Disposition Plan: to be determined- begin to ambulate  Consultants: PCCM  Procedures: 12/31- I and D of Bartholins cyst  Antibiotics: Zosyn 01/01 >>>  Vancomycin 01/01 >>>   DVT prophylaxis: Heparin  Objective: Blood pressure 156/120, pulse 88, temperature 98.5 F (36.9 C), temperature source Oral, resp. rate 15, height 5\' 7"  (1.702 m), weight 81.2 kg (179 lb 0.2 oz), SpO2 99.00%.  Intake/Output Summary (Last 24 hours) at 04/11/13 1038 Last data filed at 04/11/13 0000  Gross per 24 hour  Intake      0 ml  Output      2 ml  Net     -2 ml     Exam: General: No acute respiratory distress- tremors of head, hands and arms Lungs: Clear to auscultation bilaterally without wheezes or crackles Cardiovascular: Regular rate and rhythm without murmur gallop or rub normal S1 and S2 Abdomen: Nontender, nondistended, soft, bowel sounds positive, no rebound, no ascites, no appreciable mass Extremities: No significant cyanosis, clubbing, or edema bilateral lower extremities Genitals: no signs of residual cyst- candida dermatitis in groin noted   Data Reviewed: Basic Metabolic Panel:  Recent Labs Lab 04/05/13 2200  04/05/13 2225  04/06/13 1928 04/07/13 0228  04/09/13 1519 04/09/13 1825 04/09/13 2330 04/10/13 0350 04/11/13 0350  NA 136*  --   < > 140 140  < > 142 142 147 147 140  K 4.0  --   < > 3.1* 4.1  < > 3.2* 3.0* 3.2* 3.2* 3.7  CL 93*  --   < > 109 108  < > 107 109 112 111 105  CO2 15*  --   < > 19 19  < > 20 20 21 21 21   GLUCOSE 532*  --   < > 147* 124*  < > 180* 159* 136* 171* 145*  BUN 12  --   < > 7 7  < > 3* 3* 3* 3* 8  CREATININE 0.62  --   < > 0.68 0.72  < > 0.63  0.77 0.88 1.00 1.32*  CALCIUM 9.4  --   < > 8.6 8.7  < > 8.6 8.5 8.7 8.7 8.2*  MG  --  1.8  --   --  1.6  --   --   --   --   --  1.5  PHOS  --   --   --   --  2.0*  --   --   --   --   --   --   < > = values in this interval not displayed. Liver Function Tests:  Recent Labs Lab 04/05/13 2200 04/07/13 0228 04/11/13 0350  AST 12 27 14   ALT 10 10 11   ALKPHOS 144* 100 93  BILITOT 0.4 0.6 0.3  PROT 8.2 6.6 6.2  ALBUMIN 4.4 3.4* 3.0*   No results found for this basename: LIPASE, AMYLASE,  in the last 168 hours No results found for this basename: AMMONIA,  in the last 168 hours CBC:  Recent Labs Lab 04/05/13 2225 04/06/13 0535 04/07/13 0228 04/11/13 0350  WBC 15.0* 14.1* 10.5 9.6  NEUTROABS 12.5*  --   --   --   HGB 14.5 13.1 14.0 13.0  HCT 41.8 37.3 39.2 37.2  MCV 86.7 86.5 86.2 86.5  PLT 237 202 163 185   Cardiac Enzymes:  Recent Labs Lab 04/05/13 2225  TROPONINI <0.30   BNP (last 3 results)  Recent Labs  12/04/12 1659  PROBNP 411.3*   CBG:  Recent Labs Lab 04/10/13 1553 04/10/13 1638 04/10/13 1920 04/11/13 0012 04/11/13 0747  GLUCAP 59* 81 104* 161* 128*    Recent Results (from the past 240 hour(s))  CULTURE, BLOOD (ROUTINE X 2)     Status: None   Collection Time    04/05/13 11:15 PM      Result Value Range Status   Specimen Description BLOOD LEFT HAND   Final   Special Requests BOTTLES DRAWN AEROBIC AND ANAEROBIC 5CC   Final   Culture NO GROWTH 5 DAYS   Final   Report Status 04/10/2013 FINAL   Final  CULTURE, BLOOD (ROUTINE X 2)     Status: None   Collection Time    04/05/13 11:34 PM      Result Value Range Status   Specimen Description BLOOD LEFT HAND   Final   Special Requests BOTTLES DRAWN AEROBIC AND ANAEROBIC 5CC   Final   Culture NO GROWTH 5 DAYS   Final   Report Status 04/10/2013 FINAL   Final  MRSA PCR SCREENING     Status: Abnormal   Collection Time    04/06/13  3:11 AM  Result Value Range Status   MRSA by PCR POSITIVE (*)  NEGATIVE Final   Comment:            The GeneXpert MRSA Assay (FDA     approved for NASAL specimens     only), is one component of a     comprehensive MRSA colonization     surveillance program. It is not     intended to diagnose MRSA     infection nor to guide or     monitor treatment for     MRSA infections.     RESULT CALLED TO, READ BACK BY AND VERIFIED WITH:     TURNER,D RN [phone] AT [id number] SKEEN,P     Studies:  Recent x-ray studies have been reviewed in detail by the Attending Physician  Scheduled Meds:  Scheduled Meds: . aspirin  325 mg Oral Daily  . DULoxetine  60 mg Oral Daily  . heparin  5,000 Units Subcutaneous Q8H  . insulin aspart  0-20 Units Subcutaneous TID WC  . insulin aspart  6 Units Subcutaneous TID WC  . insulin detemir  160 Units Subcutaneous Daily  . levothyroxine  75 mcg Oral QAC breakfast  . magnesium sulfate 1 - 4 g bolus IVPB  2 g Intravenous Once  . metoprolol succinate  25 mg Oral BID  . potassium chloride  40 mEq Oral BID  . primidone  150 mg Oral BID  . sertraline  150 mg Oral Daily   Continuous Infusions:    Time spent on care of this patient: >35 min   Debbe Odea, MD  [facility] Office  [phone] Pager - Text Page per Shea Evans as per below:  On-Call/Text Page:      [email or web address]      password [email or web address]  If 7PM-7AM, please contact night-coverage [email or web address] Password [email or web address] 04/11/2013, 10:38 AM   LOS: 6 days

## 2013-04-11 NOTE — Progress Notes (Signed)
1300 Past medical history and present hospitalization course reported to receiving nurse [phone]. All questions answered and all belongings transported with patient via wheelchair. Visitor ambulating with patient to [phone].

## 2013-04-12 LAB — BASIC METABOLIC PANEL
BUN: 13 mg/dL (ref 6–23)
CO2: 21 mEq/L (ref 19–32)
Calcium: 9 mg/dL (ref 8.4–10.5)
Chloride: 107 mEq/L (ref 96–112)
Creatinine, Ser: 1.35 mg/dL — ABNORMAL HIGH (ref 0.50–1.10)
GFR calc Af Amer: 47 mL/min — ABNORMAL LOW (ref >90)
GFR calc non Af Amer: 41 mL/min — ABNORMAL LOW (ref >90)
Glucose, Bld: 187 mg/dL — ABNORMAL HIGH (ref 70–99)
Potassium: 3.9 mEq/L (ref 3.7–5.3)
Sodium: 143 mEq/L (ref 137–147)

## 2013-04-12 LAB — GLUCOSE, CAPILLARY
Glucose-Capillary: 204 mg/dL — ABNORMAL HIGH (ref 70–99)
Glucose-Capillary: 281 mg/dL — ABNORMAL HIGH (ref 70–99)
Glucose-Capillary: 140 mg/dL — ABNORMAL HIGH (ref 70–99)

## 2013-04-12 LAB — MAGNESIUM: Magnesium: 2 mg/dL (ref 1.5–2.5)

## 2013-04-12 MED ORDER — INSULIN DETEMIR 100 UNIT/ML ~~LOC~~ SOLN: 160.0000 [IU] | Freq: Every day | SUBCUTANEOUS | Status: DC

## 2013-04-12 MED ORDER — INSULIN ASPART 100 UNIT/ML ~~LOC~~ SOLN: 6.0000 [IU] | Freq: Three times a day (TID) | SUBCUTANEOUS | Status: DC

## 2013-04-12 NOTE — Discharge Summary (Signed)
DISCHARGE SUMMARY  [name]  MR#: [id number]  DOB:[birth year]  Date of Admission: 04/05/2013 Date of Discharge: 04/12/2013  Attending Physician:MCCLUNG,JEFFREY T  Patient's [id number] Ronnald Ramp, MD  Consults:  PCCM >> TRH  Neuro  Disposition: D/C home   Follow-up Appts:     Follow-up Information   Follow up with Scarlette Calico, MD. Schedule an appointment as soon as possible for a visit in 5 days.   Specialty:  Internal Medicine   Contact information:   [address] [place] [address] [phone]      Tests Needing Follow-up: - Recheck of renal function is suggested - d/c crt 1.35 - Evaluation of CBG control is suggested   Discharge Diagnoses: DKA in severely uncontrolled DM  HYPERTENSION  Hypothyroidism  Bartholin's cyst  Acute encephalopathy   Hypomagnesemia  Hypokalemia   Hx of AV nodal re-entry tachycardia  H/o vocal cord dysfunction vs/ asthma  TREMOR, ESSENTIAL  MRSA screen +   Initial presentation: 63 y/o female with hx of DM who had a Bartholin cyst I&D w/o event and was d/c home. Later her husband reported shaking with progressive change in mental status (unclear if seizure as pt with bleeding tongue but no loss of conscious per husband). Blood pressure was noted to be markedly elevated at 200/131 thus PRES in differential. Pt was taken to [place] and was noted to have elevated blood sugar with concern for DKA. She was transferred to [facility] for further assessment  Hospital Course:  SIGNIFICANT EVENTS:  12/31 Underwent Bartholin cyst I&D and returned with acute encephalopathy and DKA  12/31 Head CT >>> nad  1/01 Transferred to [facility], neuro consulted   DKA in severely uncontrolled DM  had run out of her insulin x 3 days at time of presentation - was on/off insulin gtt while in hospital - has very high insulin requirement at home (reportedly 180 of levemir daily)  - pt admits to her "usual" CBG at home being "around  250" along w/ dietary noncompliance - CBG much better w/ adjustment in dosing - further titration likely to be required in outpt f/u - pt educate on importance of close control and dietary compliance   Acute renal insuff crt 0.54 at admit, but climbed to peak of 1.35 - lytes stable - suspected acute insult due to severe DKA associated hemodynamic instability - suggest recheck in 5-7 days - avoiding/stopping ACE/ARB for now due to this issue  HYPERTENSION  BP reasonably well controlled at time of d/c    Hypothyroidism  Cont synthroid   Bartholin's cyst  reproted I and D on 12/31 by ER - no wound culture in system - wound essentialy healed on exam - has completed a full course of abx tx   Acute encephalopathy  head CT negative - EEG revealed mild encephalopathy - Neuro has seen - improved steadily to baseline mental status at time of d/c - d/c home w/ husband   Hypomagnesemia  Resolved w/ Mg 2.0 at time of d/c   Hypokalemia  Resolved w/ K+ 3.9 at time of d/c   Hx of AV nodal re-entry tachycardia  Stable during hospitalization   H/o vocal cord dysfunction vs/ asthma  Stable during hospitalization   TREMOR, ESSENTIAL  Mysoline   MRSA screen +  Contact precautions     Medication List    STOP taking these medications       HYDROcodone-acetaminophen 5-325 MG per tablet  Commonly known as:  NORCO     losartan 50 MG tablet  Commonly known as:  COZAAR     NYQUIL PO     olmesartan 40 MG tablet  Commonly known as:  BENICAR      TAKE these medications       albuterol (2.5 MG/3ML) 0.083% nebulizer solution  Commonly known as:  PROVENTIL  Take 2.5 mg by nebulization every 6 (six) hours as needed for wheezing or shortness of breath.     PROAIR HFA 108 (90 BASE) MCG/ACT inhaler  Generic drug:  albuterol  Inhale 2 puffs into the lungs every 6 (six) hours as needed for wheezing.     aspirin EC 81 MG tablet  Take 81 mg by mouth every morning.     cyclobenzaprine 10 MG  tablet  Commonly known as:  FLEXERIL  Take 0.5 tablets (5 mg total) by mouth 3 (three) times daily as needed for muscle spasms.     DULoxetine 60 MG capsule  Commonly known as:  CYMBALTA  Take 1 capsule (60 mg total) by mouth daily.     insulin aspart 100 UNIT/ML injection  Commonly known as:  novoLOG  Inject 6 Units into the skin 3 (three) times daily with meals.     insulin detemir 100 UNIT/ML injection  Commonly known as:  LEVEMIR  Inject 1.6 mLs (160 Units total) into the skin daily.     levothyroxine 75 MCG tablet  Commonly known as:  SYNTHROID, LEVOTHROID  Take 75 mcg by mouth daily before breakfast.     LORazepam 1 MG tablet  Commonly known as:  ATIVAN  Take 1 tablet (1 mg total) by mouth 3 (three) times daily.     metoprolol succinate 50 MG 24 hr tablet  Commonly known as:  TOPROL-XL  Take 50 mg by mouth 2 (two) times daily. Take with or immediately following a meal.     mometasone 50 MCG/ACT nasal spray  Commonly known as:  NASONEX  Place 2 sprays into the nose every morning.     primidone 50 MG tablet  Commonly known as:  MYSOLINE  Take 150 mg by mouth 2 (two) times daily.     sertraline 100 MG tablet  Commonly known as:  ZOLOFT  Take 150 mg by mouth daily.       Day of Discharge BP 126/72  Pulse 97  Temp(Src) 98 F (36.7 C) (Oral)  Resp 16  Ht 5\' 7"  (1.702 m)  Wt 81.6 kg (179 lb 14.3 oz)  BMI 28.17 kg/m2  SpO2 100%  Physical Exam: General: No acute respiratory distress Lungs: Clear to auscultation bilaterally without wheezes or crackles Cardiovascular: Regular rate and rhythm without murmur gallop or rub normal S1 and S2 Abdomen: Nontender, nondistended, soft, bowel sounds positive, no rebound, no ascites, no appreciable mass Extremities: No significant cyanosis, clubbing, or edema bilateral lower extremities  Results for orders placed during the hospital encounter of 04/05/13 (from the past 24 hour(s))  GLUCOSE, CAPILLARY     Status: Abnormal    Collection Time    04/11/13  5:02 PM      Result Value Range   Glucose-Capillary 183 (*) 70 - 99 mg/dL  GLUCOSE, CAPILLARY     Status: Abnormal   Collection Time    04/11/13  9:59 PM      Result Value Range   Glucose-Capillary 100 (*) 70 - 99 mg/dL   Comment 1 Notify RN     Comment 2 Documented in Chart  BASIC METABOLIC PANEL     Status: Abnormal   Collection Time    04/12/13  5:20 AM      Result Value Range   Sodium 143  137 - 147 mEq/L   Potassium 3.9  3.7 - 5.3 mEq/L   Chloride 107  96 - 112 mEq/L   CO2 21  19 - 32 mEq/L   Glucose, Bld 187 (*) 70 - 99 mg/dL   BUN 13  6 - 23 mg/dL   Creatinine, Ser 1.35 (*) 0.50 - 1.10 mg/dL   Calcium 9.0  8.4 - 10.5 mg/dL   GFR calc non Af Amer 41 (*) >90 mL/min   GFR calc Af Amer 47 (*) >90 mL/min  MAGNESIUM     Status: None   Collection Time    04/12/13  5:20 AM      Result Value Range   Magnesium 2.0  1.5 - 2.5 mg/dL  GLUCOSE, CAPILLARY     Status: Abnormal   Collection Time    04/12/13  5:48 AM      Result Value Range   Glucose-Capillary 204 (*) 70 - 99 mg/dL  GLUCOSE, CAPILLARY     Status: Abnormal   Collection Time    04/12/13  7:35 AM      Result Value Range   Glucose-Capillary 281 (*) 70 - 99 mg/dL  GLUCOSE, CAPILLARY     Status: Abnormal   Collection Time    04/12/13 12:21 PM      Result Value Range   Glucose-Capillary 140 (*) 70 - 99 mg/dL    Time spent in discharge (includes decision making & examination of pt): >35 minutes  04/12/2013, 1:33 PM   Cherene Altes, MD [facility] Office  [phone] Pager [phone]  On-Call/Text Page:      [email or web address]      password [facility]

## 2013-04-12 NOTE — Discharge Instructions (Signed)
Diabetic Ketoacidosis °Diabetic ketoacidosis (DKA) is a life-threatening complication of type 1 diabetes. It must be quickly recognized and treated. Treatment requires hospitalization. °CAUSES  °When there is no insulin in the body, glucose (sugar) cannot be used and the body breaks down fat for energy. When fat breaks down, acids (ketones) build up in the blood. Very high levels of glucose and high levels of acids lead to severe loss of body fluids (dehydration) and other dangerous chemical changes. This stresses your vital organs and can cause coma or death. °SYMPTOMS  °· Tiredness (fatigue). °· Weight loss. °· Excessive thirst. °· Ketones in the urine. °· Lightheadedness. °· Fruity or sweet smell on your breath. °· Excessive urination. °· Visual changes. °· Confusion or irritability. °· Feeling sick to your stomach (nauseous) or vomiting. °· Rapid breathing. °· Stomachache or belly (abdominal) pain. °DIAGNOSIS  °Your caregiver will diagnose DKA based on your history, physical exam, and blood tests. Your caregiver will check if there is another illness present which caused you to go into DKA. Most of this will be done quickly in an emergency room. °TREATMENT  °· Fluid replacement to correct dehydration. °· Insulin. °· Correction of electrolytes, such as potassium and sodium. °· Medicines (antibiotics) that kill germs for infections. °PREVENTION °· Always take your insulin. Do not skip your insulin injections. °· If you are ill, treat yourself quickly. Your body often needs more insulin to fight the illness. °· Check your blood glucose regularly. °· Check urine ketones if your blood glucose is greater than 240 milligrams per deciliter (mg/dl). °· Do not used expired or outdated insulin. °· If your blood glucose is high, drink plenty of fluids. This helps flush out ketones. °HOME CARE INSTRUCTIONS  °· If you are ill, follow the advice of your caregiver. °· To prevent loss of body fluids (dehydration), drink enough  water and fluids to keep your urine clear or pale yellow. °· If you cannot eat, alternate between drinking fluids with sugar (soda, juices, flavored gelatin) and salty fluids (broth, bouillon). °· If you can eat, follow your usual diet and drink sugar-free liquids (water, diet drinks). °· Always take your usual dose of insulin. If you cannot eat, or your glucose is getting too low, call your caregiver for further instructions. °· Continue to monitor your blood or urine ketones every 3 to 4 hours around the clock. Set your alarm clock or have someone wake you up. If you are too sick, have someone test it for you. °· Rest and avoid exercise. °SEEK MEDICAL CARE IF:  °· You have ketones in your urine or your blood glucose is higher than a level your caregiver suggests. You may need extra insulin. Call your caregiver if you need advice on adjusting your insulin. °· You cannot drink at least a tablespoon of fluid every 15 to 20 minutes. °· You have been throwing up for more than 2 hours. °· You have symptoms of DKA: °· Fruity smelling breath. °· Breathing faster or slower. °· Becoming very sleepy. °SEEK IMMEDIATE MEDICAL CARE IF:  °· You have signs of dehydration: °· Decreased urination. °· Increased thirst. °· Dry skin and mouth. °· Lightheadedness. °· Your blood glucose is very high (as advised by your caregiver) twice in a row. °· You or your child has an oral temperature above 102° F (38.9° C), not controlled by medicine. °· You pass out. °· You have chest pain and/or trouble breathing. °· You have a sudden, severe headache. °· You have sudden   weakness in one arm and/or one leg. °· You have sudden difficulty speaking and/or swallowing. °· You develop vomiting and/or diarrhea that is getting worse after 3 to 4 hours. °· You have abdominal pain. °MAKE SURE YOU:  °· Understand these instructions. °· Will watch your condition. °· Will get help right away if you are not doing well or get worse. °Document Released:  03/20/2000 Document Revised: 06/15/2011 Document Reviewed: 09/26/2008 °[employer]® Patient Information ©2014 [employer]. ° °

## 2013-04-12 NOTE — Progress Notes (Signed)
Discharge instructions and prescriptions for novolog and levemir given to pt.  Instructions explained to pt. And pt.'s husband.  Both verbalized understanding and had no questions.  IV removed.  Pt. Discharged to home with husband. Syliva Overman

## 2013-04-13 ENCOUNTER — Ambulatory Visit: Payer: Self-pay | Admitting: Endocrinology

## 2013-04-13 DIAGNOSIS — Z0289 Encounter for other administrative examinations: Secondary | ICD-10-CM

## 2013-04-17 ENCOUNTER — Encounter: Payer: Self-pay | Admitting: Family Medicine

## 2013-04-17 ENCOUNTER — Ambulatory Visit (INDEPENDENT_AMBULATORY_CARE_PROVIDER_SITE_OTHER): Payer: BC Managed Care – PPO | Admitting: Family Medicine

## 2013-04-17 VITALS — BP 136/78 | HR 98 | Temp 97.8°F | Resp 16 | Wt 181.8 lb

## 2013-04-17 DIAGNOSIS — G5700 Lesion of sciatic nerve, unspecified lower limb: Secondary | ICD-10-CM

## 2013-04-17 DIAGNOSIS — G5701 Lesion of sciatic nerve, right lower limb: Secondary | ICD-10-CM

## 2013-04-17 NOTE — Patient Instructions (Signed)
Good to see you Start the exercises again and do most days of the week They will call you to set up physical therapy.   Wear the tennis ball in the back right pocket, even in church.  Come back again in 4 weeks.  Piriformis Syndrome with Rehab Piriformis syndrome is a condition the affects the nervous system in the area of the hip, and is characterized by pain and possibly a loss of feeling in the backside (posterior) thigh that may extend down the entire length of the leg. The symptoms are caused by an increase in pressure on the sciatic nerve by the piriformis muscle, which is on the back of the hip and is responsible for externally rotating the hip. The sciatic nerve and its branches connect to much of the leg. Normally the sciatic nerve runs between the piriformis muscle and other muscles. However, in certain individuals the nerve runs through the muscle, which causes an increase in pressure on the nerve and results in the symptoms of piriformis syndrome. SYMPTOMS   Pain, tingling, numbness, or burning in the back of the thigh that may also extend down the entire leg.  Occasionally, tenderness in the buttock.  Loss of function of the leg.  Pain that worsens when using the piriformis muscle (running, jumping, or stairs).  Pain that increases with prolonged sitting.  Pain that is lessened by laying flat on the back. CAUSES   Piriformis syndrome is the result of an increase in pressure placed on the sciatic nerve. Often times piriformis syndrome is an overuse injury.  Stress placed on the nerve from a sudden increase in the intensity, frequency, or duration of training.  Compensation of other extremity injuries. RISK INCREASES WITH:  Sports that involve the piriformis muscle (running, walking or jumping).  You are born with (congenital) a defect in which the sciatic nerve passes through the muscle. PREVENTION  Warm up and stretch properly before activity.  Allow for adequate  recovery between workouts.  Maintain physical fitness:  Strength, flexibility, and endurance.  Cardiovascular fitness. PROGNOSIS  If treated properly, then the symptoms of piriformis syndrome usually resolve in 2 to 6 weeks. RELATED COMPLICATIONS   Persistent and possibly permanent pain and numbness in the lower extremity.  Weakness of the extremity that may progress to disability and inability to compete. TREATMENT  The most effective treatment for piriformis syndrome is rest from any activities that aggravate the symptoms. Ice and pain medication may help reduce pain and inflammation. The use of strengthening and stretching exercises may help reduce pain with activity. These exercises may be performed at home or with a therapist. A referral to a therapist may be given for further evaluation and treatment, such as ultrasound. Corticosteroid injections may be given to reduce inflammation that is causing pressure to be placed on the sciatic nerve. If non-surgical (conservative) treatment is unsuccessful, then surgery may be recommended.  MEDICATION   If pain medication is necessary, then nonsteroidal anti-inflammatory medications, such as aspirin and ibuprofen, or other minor pain relievers, such as acetaminophen, are often recommended.  Do not take pain medication for 7 days before surgery.  Prescription pain relievers may be given if deemed necessary by your caregiver. Use only as directed and only as much as you need.  Corticosteroid injections may be given by your caregiver. These injections should be reserved for the most serious cases, because they may only be given a certain number of times. HEAT AND COLD:   Cold treatment (icing) relieves  pain and reduces inflammation. Cold treatment should be applied for 10 to 15 minutes every 2 to 3 hours for inflammation and pain and immediately after any activity that aggravates your symptoms. Use ice packs or massage the area with a piece of  ice (ice massage).  Heat treatment may be used prior to performing the stretching and strengthening activities prescribed by your caregiver, physical therapist, or athletic trainer. Use a heat pack or soak the injury in warm water. SEEK IMMEDIATE MEDICAL CARE IF:  Treatment seems to offer no benefit, or the condition worsens.  Any medications produce adverse side effects. EXERCISES RANGE OF MOTION (ROM) AND STRETCHING EXERCISES - Piriformis Syndrome These exercises may help you when beginning to rehabilitate your injury. Your symptoms may resolve with or without further involvement from your physician, physical therapist or athletic trainer. While completing these exercises, remember:   Restoring tissue flexibility helps normal motion to return to the joints. This allows healthier, less painful movement and activity.  An effective stretch should be held for at least 30 seconds.  A stretch should never be painful. You should only feel a gentle lengthening or release in the stretched tissue. STRETCH - Hip Rotators  Lie on your back on a firm surface. Grasp your right / left knee with your right / left hand and your ankle with your opposite hand.  Keeping your hips and shoulders firmly planted, gently pull your right / left knee and rotate your lower leg toward your opposite shoulder until you feel a stretch in your buttocks.  Hold this stretch for __________ seconds. Repeat this stretch __________ times. Complete this stretch __________ times per day. STRETCH  Iliotibial Band  On the floor or bed, lie on your side so your right / left leg is on top. Bend your knee and grab your ankle.  Slowly bring your knee back so that your thigh is in line with your trunk. Keep your heel at your buttocks and gently arch your back so your head, shoulders and hips line up.  Slowly lower your leg so that your knee approaches the floor/bed until you feel a gentle stretch on the outside of your right /  left thigh. If you do not feel a stretch and your knee will not fall farther, place the heel of your opposite foot on top of your knee and pull your thigh down farther.  Hold this stretch for __________ seconds. Repeat __________ times. Complete __________ times per day. STRENGTHENING EXERCISES - Piriformis Syndrome  These are some of the caregiver again or until your symptoms are resolved. Remember:   Strong muscles with good endurance tolerate stress better.  Do the exercises as initially prescribed by your caregiver. Progress slowly with each exercise, gradually increasing the number of repetitions and weight used under their guidance. STRENGTH - Hip Abductors, Straight Leg Raises Be aware of your form throughout the entire exercise so that you exercise the correct muscles. Sloppy form means that you are not strengthening the correct muscles.  Lie on your side so that your head, shoulders, knee and hip line up. You may bend your lower knee to help maintain your balance. Your right / left leg should be on top.  Roll your hips slightly forward, so that your hips are stacked directly over each other and your right / left knee is facing forward.  Lift your top leg up 4-6 inches, leading with your heel. Be sure that your foot does not drift forward or that your knee  does not roll toward the ceiling.  Hold this position for __________ seconds. You should feel the muscles in your outer hip lifting (you may not notice this until your leg begins to tire).  Slowly lower your leg to the starting position. Allow the muscles to fully relax before beginning the next repetition. Repeat __________ times. Complete this exercise __________ times per day.  STRENGTH - Hip Abductors, Quadriped  On a firm, lightly padded surface, position yourself on your hands and knees. Your hands should be directly below your shoulders and your knees should be directly below your hips.  Keeping your right / left knee  bent, lift your leg out to the side. Keep your legs level and in line with your shoulders.  Position yourself on your hands and knees.  Hold for __________ seconds.  Keeping your trunk steady and your hips level, slowly lower your leg to the starting position. Repeat __________ times. Complete this exercise __________ times per day.  STRENGTH - Hip Abductors, Standing  Tie one end of a rubber exercise band/tubing to a secure surface (table, pole) and tie a loop at the other end.  Place the loop around your right / left ankle. Keeping your ankle with the band directly opposite of the secured end, step away until there is tension in the tube/band.  Hold onto a chair as needed for balance.  Keeping your back upright, your shoulders over your hips, and your toes pointing forward, lift your right / left leg out to your side. Be sure to lift your leg with your hip muscles. Do not "throw" your leg or tip your body to lift your leg.  Slowly and with control, return to the starting position. Repeat exercise __________ times. Complete this exercise __________ times per day.  Document Released: 03/23/2005 Document Revised: 09/22/2011 Document Reviewed: 07/05/2008 [facility] Patient Information 2014 [place], [place].

## 2013-04-17 NOTE — Assessment & Plan Note (Signed)
Discussed with patient at length again. Patient given home exercises again to start him to do most days a week. Because of her muscle loss like her to go to formal physical therapy for strengthening conditioning as well. Referral placed for [place] Patient will come back in 4 weeks for further evaluation. I would like to avoid any type of injection and patient secondary to her fragile diabetes.

## 2013-04-17 NOTE — Progress Notes (Signed)
  Follow up  CC: Right sided piriformis followup  HPI: Patient is a very pleasant 63 year old female who I seen previously coming in for piriformis followup. Unfortunately over the course last week she has been hospitalized for a diabetic ketoacidosis. Patient states this has caused her not to do the exercises on a regular basis. Patient states that she has just started doing more walking recently. Patient actually states it is the first time she has got [place] in quite a while. Patient states that the pain is still there and does not seem to be better but has not done any interventions we discussed previously.  Past medical, surgical, family and social history reviewed. Medications reviewed all in the electronic medical record.   Review of Systems: No headache, visual changes, nausea, vomiting, diarrhea, constipation, dizziness, abdominal pain, skin rash, fevers, chills, night sweats, weight loss, swollen lymph nodes, body aches, joint swelling, muscle aches, chest pain, shortness of breath, mood changes.   Objective:    Blood pressure 136/78, pulse 98, temperature 97.8 F (36.6 C), temperature source Oral, resp. rate 16, weight 181 lb 12.8 oz (82.464 kg), SpO2 96.00%.   General: No apparent distress alert and oriented x3 mood and affect normal, dressed appropriately. Patient does have a resting tremor HEENT: Pupils equal, extraocular movements intact Respiratory: Patient's speak in full sentences and does not appear short of breath Cardiovascular: No lower extremity edema, non tender, no erythema Skin: Warm dry intact with no signs of infection or rash on extremities or on axial skeleton. Abdomen: Soft nontender Neuro: Cranial nerves II through XII are intact, neurovascularly intact in all extremities with 2+ DTRs and 2+ pulses. Lymph: No lymphadenopathy of posterior or anterior cervical chain or axillae bilaterally.  Gait mild wide-base MSK: Non tender with full range of motion and good  stability and symmetric strength and tone of shoulders, elbows, wrist,  knees bilaterally.  Back Exam:  Inspection: Unremarkable  Motion: Flexion 35 deg, Extension 35 deg, Side Bending to 35 deg bilaterally,  Rotation to 45 deg bilaterally  SLR laying: Negative  XSLR laying: Negative  Palpable tenderness: Positive over piriformis. FABER: Positive actually tighter than previous exam Sensory change: Gross sensation intact to all lumbar and sacral dermatomes.  Reflexes: 2+ at both patellar tendons, 2+ at achilles tendons, Babinski's downgoing.  Strength at foot  Plantar-flexion: 5/5 Dorsi-flexion: 5/5 Eversion: 5/5 Inversion: 5/5  Leg strength  Quad: 5/5 Hamstring: 5/5 Hip flexor: 5/5 Hip abductors: 3/5  Gait unremarkable. Patient's quadriceps seem to be moderately atrophied compared to previous visit    Impression and Recommendations:     This case required medical decision making of moderate complexity.

## 2013-04-19 ENCOUNTER — Encounter: Payer: Self-pay | Admitting: Internal Medicine

## 2013-04-19 ENCOUNTER — Other Ambulatory Visit (INDEPENDENT_AMBULATORY_CARE_PROVIDER_SITE_OTHER): Payer: BC Managed Care – PPO

## 2013-04-19 ENCOUNTER — Ambulatory Visit (INDEPENDENT_AMBULATORY_CARE_PROVIDER_SITE_OTHER): Payer: BC Managed Care – PPO | Admitting: Internal Medicine

## 2013-04-19 VITALS — BP 160/100 | HR 94 | Temp 97.1°F | Resp 16 | Wt 175.0 lb

## 2013-04-19 DIAGNOSIS — IMO0001 Reserved for inherently not codable concepts without codable children: Secondary | ICD-10-CM

## 2013-04-19 DIAGNOSIS — N179 Acute kidney failure, unspecified: Secondary | ICD-10-CM

## 2013-04-19 DIAGNOSIS — E1165 Type 2 diabetes mellitus with hyperglycemia: Principal | ICD-10-CM

## 2013-04-19 DIAGNOSIS — E039 Hypothyroidism, unspecified: Secondary | ICD-10-CM

## 2013-04-19 DIAGNOSIS — I10 Essential (primary) hypertension: Secondary | ICD-10-CM

## 2013-04-19 DIAGNOSIS — E876 Hypokalemia: Secondary | ICD-10-CM

## 2013-04-19 LAB — HEMOGLOBIN A1C: Hgb A1c MFr Bld: 10.3 % — ABNORMAL HIGH (ref 4.6–6.5)

## 2013-04-19 LAB — GLUCOSE, POCT (MANUAL RESULT ENTRY): POC Glucose: 418 mg/dl — AB (ref 70–99)

## 2013-04-19 LAB — BASIC METABOLIC PANEL
BUN: 13 mg/dL (ref 6–23)
CO2: 26 mEq/L (ref 19–32)
Calcium: 9.4 mg/dL (ref 8.4–10.5)
Chloride: 100 mEq/L (ref 96–112)
Creatinine, Ser: 1.2 mg/dL (ref 0.4–1.2)
GFR: 46.22 mL/min — ABNORMAL LOW (ref >60.00)
Glucose, Bld: 451 mg/dL — ABNORMAL HIGH (ref 70–99)
Potassium: 3.1 mEq/L — ABNORMAL LOW (ref 3.5–5.1)
Sodium: 137 mEq/L (ref 135–145)

## 2013-04-19 MED ORDER — OLMESARTAN MEDOXOMIL 40 MG PO TABS: 40.0000 mg | ORAL_TABLET | Freq: Every day | ORAL | Status: AC

## 2013-04-19 MED ORDER — POTASSIUM CHLORIDE CRYS ER 20 MEQ PO TBCR: 20.0000 meq | EXTENDED_RELEASE_TABLET | Freq: Two times a day (BID) | ORAL | Status: DC

## 2013-04-19 NOTE — Patient Instructions (Signed)

## 2013-04-19 NOTE — Progress Notes (Signed)
Subjective:    Patient ID: [name], female    DOB: Aug 26, 1950, 63 y.o.   MRN: [id number]  Diabetes She presents for her follow-up diabetic visit. She has type 1 diabetes mellitus. Her disease course has been fluctuating. There are no hypoglycemic associated symptoms. Pertinent negatives for hypoglycemia include no dizziness or headaches. Associated symptoms include polydipsia, polyphagia and polyuria. Pertinent negatives for diabetes include no blurred vision, no chest pain, no fatigue, no foot paresthesias, no foot ulcerations, no visual change, no weakness and no weight loss. There are no hypoglycemic complications. Diabetic complications include nephropathy. Current diabetic treatment includes intensive insulin program and insulin injections. She is compliant with treatment none of the time (she has been out of her insulins for 1 week, unable to afford them). Her weight is stable. She is following a generally healthy diet. Meal planning includes avoidance of concentrated sweets. She has not had a previous visit with a dietician. She never participates in exercise. Her breakfast blood glucose range is generally >200 mg/dl. Her lunch blood glucose range is generally >200 mg/dl. Her dinner blood glucose range is generally >200 mg/dl. Her highest blood glucose is >200 mg/dl. Her overall blood glucose range is >200 mg/dl. An ACE inhibitor/angiotensin II receptor blocker is contraindicated (she has not taken Benicar since discharge). She does not see a podiatrist.Eye exam is current.      Review of Systems  Constitutional: Negative.  Negative for fever, chills, weight loss, diaphoresis, appetite change and fatigue.  HENT: Negative.   Eyes: Negative.  Negative for blurred vision.  Respiratory: Negative.  Negative for cough, choking, chest tightness, shortness of breath, wheezing and stridor.   Cardiovascular: Negative.  Negative for chest pain, palpitations and leg swelling.  Gastrointestinal:  Negative.  Negative for nausea, vomiting, abdominal pain, diarrhea and constipation.  Endocrine: Positive for polydipsia, polyphagia and polyuria.  Genitourinary: Positive for frequency. Negative for dysuria, urgency and hematuria.  Musculoskeletal: Negative.   Skin: Negative.   Allergic/Immunologic: Negative.   Neurological: Negative.  Negative for dizziness, weakness, light-headedness and headaches.  Hematological: Negative.  Negative for adenopathy. Does not bruise/bleed easily.  Psychiatric/Behavioral: Negative.        Objective:   Physical Exam  Vitals reviewed. Constitutional: She is oriented to person, place, and time. She appears well-developed and well-nourished. No distress.  HENT:  Head: Normocephalic and atraumatic.  Mouth/Throat: Oropharynx is clear and moist. No oropharyngeal exudate.  Eyes: Conjunctivae are normal. Right eye exhibits no discharge. Left eye exhibits no discharge. No scleral icterus.  Neck: Normal range of motion. Neck supple. No JVD present. No tracheal deviation present. No thyromegaly present.  Cardiovascular: Normal rate, regular rhythm, normal heart sounds and intact distal pulses.  Exam reveals no gallop and no friction rub.   No murmur heard. Pulmonary/Chest: Effort normal and breath sounds normal. No stridor. No respiratory distress. She has no wheezes. She has no rales. She exhibits no tenderness.  Abdominal: Soft. Bowel sounds are normal. She exhibits no distension and no mass. There is no tenderness. There is no rebound and no guarding.  Musculoskeletal: Normal range of motion. She exhibits no edema and no tenderness.  Lymphadenopathy:    She has no cervical adenopathy.  Neurological: She is oriented to person, place, and time.  Skin: Skin is warm and dry. No rash noted. She is not diaphoretic. No erythema. No pallor.     Lab Results  Component Value Date   WBC 9.6 04/11/2013   HGB 13.0 04/11/2013  HCT 37.2 04/11/2013   PLT 185 04/11/2013    GLUCOSE 187* 04/12/2013   CHOL 187 01/18/2013   TRIG 96.0 01/18/2013   HDL 79.70 01/18/2013   LDLCALC 88 01/18/2013   ALT 11 04/11/2013   AST 14 04/11/2013   NA 143 04/12/2013   K 3.9 04/12/2013   CL 107 04/12/2013   CREATININE 1.35* 04/12/2013   BUN 13 04/12/2013   CO2 21 04/12/2013   TSH 1.618 04/06/2013   INR 1.0 02/27/2008   HGBA1C 9.5* 01/18/2013       Assessment & Plan:

## 2013-04-19 NOTE — Progress Notes (Signed)
Pre visit review using our clinic review tool, if applicable. No additional management support is needed unless otherwise documented below in the visit note. 

## 2013-04-21 ENCOUNTER — Ambulatory Visit: Payer: Self-pay | Admitting: Endocrinology

## 2013-04-21 ENCOUNTER — Encounter: Payer: Self-pay | Admitting: Internal Medicine

## 2013-04-21 NOTE — Assessment & Plan Note (Signed)
Her blood sugar is not well controlled due to financial issues so I gave her samples of her insulins today

## 2013-04-21 NOTE — Assessment & Plan Note (Signed)
Her TSH is on the normal range 

## 2013-04-21 NOTE — Assessment & Plan Note (Signed)
Her BP is not well controlled I have asked her to restart Benicar

## 2013-04-28 ENCOUNTER — Telehealth: Payer: Self-pay | Admitting: Endocrinology

## 2013-04-28 NOTE — Telephone Encounter (Signed)
Pt called and requested a call back.

## 2013-04-28 NOTE — Telephone Encounter (Signed)
Caller: Coy/Patient; PCP: Renato Shin (Adults only); CB#: [phone]; Call regarding Low blood sugar, pt doesn t know if she should take her insulin ;  Since being in hospital 1/7 with high blood sugars has been taking Levamir 160 units QAM.  Then takes 6 units regular insulin with each meal.  Was suppposed to receive a sliding scale for meals but was not given this.  Blood sugars in the mornings usually running just below 200.   Yesterday 1/22 had Regular Insulin 6 units at breakfast -- forgot to take the 6 units at Lunch -- took the 6 units again at [employer]. This am 1/23 Blood sugar is 125 and usually not this low.  Concern about taking Levamir 160 units now - unsure is she should take this much.  Please review.  Contact patient at  [phone].

## 2013-04-28 NOTE — Telephone Encounter (Signed)
Please continue same levemir Please d/c novolog Ov next week.   Call sooner for any cbg below 70

## 2013-04-28 NOTE — Telephone Encounter (Signed)
Pt informed and is coming in for a office visit on the 30th.

## 2013-05-04 ENCOUNTER — Other Ambulatory Visit: Payer: Self-pay

## 2013-05-04 MED ORDER — METOPROLOL SUCCINATE ER 50 MG PO TB24: 50.0000 mg | ORAL_TABLET | Freq: Two times a day (BID) | ORAL | Status: DC

## 2013-05-05 ENCOUNTER — Ambulatory Visit (INDEPENDENT_AMBULATORY_CARE_PROVIDER_SITE_OTHER): Payer: BC Managed Care – PPO | Admitting: Family Medicine

## 2013-05-05 ENCOUNTER — Encounter: Payer: Self-pay | Admitting: Family Medicine

## 2013-05-05 ENCOUNTER — Ambulatory Visit (INDEPENDENT_AMBULATORY_CARE_PROVIDER_SITE_OTHER): Payer: BC Managed Care – PPO | Admitting: Endocrinology

## 2013-05-05 ENCOUNTER — Encounter: Payer: Self-pay | Admitting: Endocrinology

## 2013-05-05 VITALS — BP 140/78 | HR 74 | Temp 98.9°F | Resp 18 | Wt 178.0 lb

## 2013-05-05 VITALS — BP 132/82 | HR 121 | Temp 97.6°F | Ht 67.0 in | Wt 177.0 lb

## 2013-05-05 DIAGNOSIS — IMO0001 Reserved for inherently not codable concepts without codable children: Secondary | ICD-10-CM

## 2013-05-05 DIAGNOSIS — E1165 Type 2 diabetes mellitus with hyperglycemia: Principal | ICD-10-CM

## 2013-05-05 DIAGNOSIS — M654 Radial styloid tenosynovitis [de Quervain]: Secondary | ICD-10-CM | POA: Insufficient documentation

## 2013-05-05 NOTE — Progress Notes (Signed)
Follow up  CC: Right sided piriformis followup New problem with right wrist  HPI: Patient is a very pleasant 63 year old female who I seen previously coming in for piriformis followup. She states that the pain has completely resolved as long as she does the exercises 3-4 times a week. Patient is very happy with the results and denies any pain radiation down the leg or any numbness. Patient denies any new symptoms and can rest comfortably as well as ambulate without any significant pain.  Patient is coming in with a new problem of right wrist pain. Patient states it just hurts with certain range of motion which usually means when she cannot points her thumb down she has some discomfort on the radial aspect of her wrist. Patient denies any radiation of pain or any numbness. Patient states that the pain is mostly sharp. Denies any loss of strength. Patient rates the pain as 4/10 in severity and can continue her regular exercises and daily activities without any significant stoppage.   Past medical, surgical, family and social history reviewed. Medications reviewed all in the electronic medical record.   Review of Systems: No headache, visual changes, nausea, vomiting, diarrhea, constipation, dizziness, abdominal pain, skin rash, fevers, chills, night sweats, weight loss, swollen lymph nodes, body aches, joint swelling, muscle aches, chest pain, shortness of breath, mood changes.   Objective:    Blood pressure 140/78, pulse 74, temperature 98.9 F (37.2 C), temperature source Oral, resp. rate 18, weight 178 lb 0.6 oz (80.758 kg), SpO2 98.00%.   General: No apparent distress alert and oriented x3 mood and affect normal, dressed appropriately. Patient does have a resting tremor HEENT: Pupils equal, extraocular movements intact Respiratory: Patient's speak in full sentences and does not appear short of breath Cardiovascular: No lower extremity edema, non tender, no erythema Skin: Warm dry intact  with no signs of infection or rash on extremities or on axial skeleton. Abdomen: Soft nontender Neuro: Cranial nerves II through XII are intact, neurovascularly intact in all extremities with 2+ DTRs and 2+ pulses. Lymph: No lymphadenopathy of posterior or anterior cervical chain or axillae bilaterally.  Gait mild wide-base MSK: Non tender with full range of motion and good stability and symmetric strength and tone of shoulders, elbows,   knees bilaterally.  Back Exam:  Inspection: Unremarkable  Motion: Flexion 35 deg, Extension 35 deg, Side Bending to 35 deg bilaterally,  Rotation to 45 deg bilaterally  SLR laying: Negative  XSLR laying: Negative  Palpable tenderness: Positive over piriformis. FABER: Negative  Sensory change: Gross sensation intact to all lumbar and sacral dermatomes.  Reflexes: 2+ at both patellar tendons, 2+ at achilles tendons, Babinski's downgoing.  Strength at foot  Plantar-flexion: 5/5 Dorsi-flexion: 5/5 Eversion: 5/5 Inversion: 5/5  Leg strength  Quad: 5/5 Hamstring: 5/5 Hip flexor: 5/5 Hip abductors: 3/5  Gait unremarkable.  Wrist: Right Inspection normal with no visible erythema or swelling. ROM smooth and normal with good flexion and extension and ulnar/radial deviation that is symmetrical with opposite wrist. Patient does have mild discomfort with radial deviation Palpation is normal over metacarpals, navicular, lunate, and TFCC; tendons without tenderness/ swelling No snuffbox tenderness. No tenderness over Canal of [place]. Strength 5/5 in all directions without pain. Equivocal Finkelstein, but negative tinel's and phalens. Negative Watson's test. Contralateral wrist unremarkable    Impression and Recommendations:     This case required medical decision making of moderate complexity. Spent greater than 25 minutes with patient face-to-face and had greater than 50% of counseling  including as described above in assessment and plan.

## 2013-05-05 NOTE — Assessment & Plan Note (Signed)
The patient does have what appears to be some mild de Quervain's tenosynovitis as well as potentially some underlying CMC osteoarthritis. Due to technical difficulties unable to do ultrasound today. Patient was fitted into a thumb spica splint today she'll wear daily and nightly for the next week and then nightly thereafter for another week. Patient was given a home exercise program to start in one week. We discussed anti-inflammatories and other medicines that could be beneficial including topical over-the-counter medications. Patient will come back again in 3 weeks for further evaluation we'll hopefully do an ultrasound to further evaluate.

## 2013-05-05 NOTE — Progress Notes (Signed)
Pre-visit discussion using our clinic review tool. No additional management support is needed unless otherwise documented below in the visit note.  

## 2013-05-05 NOTE — Patient Instructions (Signed)
Good to see you Wear the brace day and night for the next week then only at night for another week Ice 20 minutes 2 times a day can help Try exercises 3 times a week Try arnica gel. Ask at [facility] or otherwise at [facility].  See you on the 17th.

## 2013-05-05 NOTE — Progress Notes (Signed)
Subjective:    Patient ID: [name], female    DOB: 1950-09-27, 63 y.o.   MRN: [id number]  HPI pt returns for f/u of insulin-requiring DM (dx'ed 1994, on a routine blood test; she has mild if any neuropathy of the lower extremities; she is unaware of any associated chronic complications; she has been on insulin since 2012).  In November of 2014, she was changed to a simpler qd insulin regimen, due to poor results with multiple daily injections).  She was admitted to hospital with DKA a few weeks ago, after she ran out of insulin.  She says this was because her insurance did not pay for it.  Pt says this has been resolved, and insurance is now paying without any problem.  She feels much better now.  She called and reported mild hypoglycemia.  Therefore, her levemir was reduced to 160 units qam.  she brings a record of her cbg's which i have reviewed today.  Since the insulin reduction, it varies from 95-200, but most are in the low to mid-100's.  There is no trend throughout the day.   Past Medical History  Diagnosis Date  . Hyperlipidemia   . Hypertension   . Diabetes mellitus   . Asthma   . 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 [name], residual palpitations  . Vocal cord dysfunction   . Esophageal reflux   . Tremor, essential     Neurontin 600 mg qhs x 1 yr--no help.  Inderal prior to this was helpful for 10 yrs then stopped working.  . Anxiety   . Allergic rhinitis   . Low sodium syndrome   . Depression     Past Surgical History  Procedure Laterality Date  . Tonsillectomy    . Right breast cyst      benign  . Left ankle ligament repair    . Left elbow repair    . Partial hysterectomy    . Cholecystectomy    . Cesarean section    . Ablation for avnrt    . Colonoscopy  01/16/2004    [id number] rectum/colon  . Esophagogastroduodenoscopy (egd) with esophageal dilation  04/03/2002    [id number] ring, otherwise normal  esophagus, status post dilation with 54 F/Normal stomach  . Bartholin gland cyst excision    . Esophagogastroduodenoscopy (egd) with esophageal dilation N/A 11/08/2012    Procedure: ESOPHAGOGASTRODUODENOSCOPY (EGD) WITH ESOPHAGEAL DILATION;  Surgeon: Daneil Dolin, MD;  Location: [facility];  Service: Endoscopy;  Laterality: N/A;  11:30  . Abdominal hysterectomy      History   Social History  . Marital Status: Married    Spouse Name: N/A    Number of Children: N/A  . Years of Education: N/A   Occupational History  . Not on file.   Social History Main Topics  . Smoking status: Former Smoker -- 0.30 packs/day for 10 years    Types: Cigarettes    Quit date: 04/06/1985  . Smokeless tobacco: Never Used  . Alcohol Use: No  . Drug Use: No  . Sexual Activity: No   Other Topics Concern  . Not on file   Social History Narrative   Married, 1 daughte, living.  Lives with husband in [place], [place].   1 child died brain tumor at age 56.   Two grandchildren.   Works at [employer], patient currently lost her job.   Retired 2011.   No tobacco.  Alcohol: none in 30 yrs.  Distant history of heavy use.   No drug use.    Current Outpatient Prescriptions on File Prior to Visit  Medication Sig Dispense Refill  . albuterol (PROAIR HFA) 108 (90 BASE) MCG/ACT inhaler Inhale 2 puffs into the lungs every 6 (six) hours as needed for wheezing.      Marland Kitchen albuterol (PROVENTIL) (2.5 MG/3ML) 0.083% nebulizer solution Take 2.5 mg by nebulization every 6 (six) hours as needed for wheezing or shortness of breath.      Marland Kitchen aspirin EC 81 MG tablet Take 81 mg by mouth every morning.       . cyclobenzaprine (FLEXERIL) 10 MG tablet Take 0.5 tablets (5 mg total) by mouth 3 (three) times daily as needed for muscle spasms.  30 tablet  1  . DULoxetine (CYMBALTA) 60 MG capsule Take 1 capsule (60 mg total) by mouth daily.  30 capsule  2  . insulin detemir (LEVEMIR) 100 UNIT/ML injection Inject 1.6 mLs (160  Units total) into the skin daily.  10 mL  1  . levothyroxine (SYNTHROID, LEVOTHROID) 75 MCG tablet Take 75 mcg by mouth daily before breakfast.      . LORazepam (ATIVAN) 1 MG tablet Take 1 tablet (1 mg total) by mouth 3 (three) times daily.  90 tablet  2  . metoprolol succinate (TOPROL-XL) 50 MG 24 hr tablet Take 1 tablet (50 mg total) by mouth 2 (two) times daily. Take with or immediately following a meal.  60 tablet  11  . mometasone (NASONEX) 50 MCG/ACT nasal spray Place 2 sprays into the nose every morning.       . olmesartan (BENICAR) 40 MG tablet Take 1 tablet (40 mg total) by mouth daily.  90 tablet  2  . potassium chloride SA (K-DUR,KLOR-CON) 20 MEQ tablet Take 1 tablet (20 mEq total) by mouth 2 (two) times daily.  60 tablet  1  . primidone (MYSOLINE) 50 MG tablet Take 150 mg by mouth 2 (two) times daily.      . sertraline (ZOLOFT) 100 MG tablet Take 150 mg by mouth daily.       No current facility-administered medications on file prior to visit.    Allergies  Allergen Reactions  . Metformin And Related     diarrhea  . Guaifenesin     MUCINEX REACTION: asthma attacks  . Morphine And Related Other (See Comments)    Pt. States med makes her crazy  . Oxycodone-Acetaminophen     TYLOX.  . Guaifenesin Er     Family History  Problem Relation Age of Onset  . Lung cancer Father     DIED AGE 68 LUNG CA  . Alcohol abuse Father   . Anxiety disorder Father   . Depression Father   . COPD Mother     DIED AGE 7,MRSA,COPD,PNEUMONIA  . Pneumonia Mother     DIED AGE 7,MRSA,COPD,PNEUMONIA  . Supraventricular tachycardia Mother   . Anxiety disorder Mother   . Depression Mother   . Alcohol abuse Mother   . COPD Brother     AGE 25  . Heart disease Sister     DIED AGE 27, SMOKER,?HEART  . Colon cancer Neg Hx     BP 132/82  Pulse 121  Temp(Src) 97.6 F (36.4 C) (Oral)  Ht 5\' 7"  (1.702 m)  Wt 177 lb (80.287 kg)  BMI 27.72 kg/m2  SpO2 96%  Review of Systems Denies any  further LOC.  Denies weight change  Objective:   Physical Exam VITAL SIGNS:  See vs page GENERAL: no distress      Assessment & Plan:  DM: This insulin regimen was chosen from multiple options, for its simplicity.  The benefits of glycemic control must be weighed against the risks of hypoglycemia.  She needs increased rx.   A-V nodal dysrhythmia: in this context, she should avoid hypoglycemia.

## 2013-05-05 NOTE — Patient Instructions (Addendum)
check your blood sugar 2 times a day.  vary the time of day when you check, between before the 3 meals, and at bedtime.  also check if you have symptoms of your blood sugar being too high or too low.  please keep a record of the readings and bring it to your next appointment here.  please call us sooner if your blood sugar goes below 70, or if you have a lot of readings over 200.   Please increase levemir to 160 units each morning.  However, if you are going to be active that day, take just 140 units. On this type of insulin schedule, you should eat meals on a regular schedule.  If a meal is missed or significantly delayed, your blood sugar could go low.  Please come back for a follow-up appointment in 3 months.

## 2013-05-10 ENCOUNTER — Ambulatory Visit (INDEPENDENT_AMBULATORY_CARE_PROVIDER_SITE_OTHER): Payer: BC Managed Care – PPO | Admitting: Psychiatry

## 2013-05-10 ENCOUNTER — Encounter (HOSPITAL_COMMUNITY): Payer: Self-pay | Admitting: Psychiatry

## 2013-05-10 VITALS — Ht 64.0 in | Wt 180.0 lb

## 2013-05-10 DIAGNOSIS — F411 Generalized anxiety disorder: Secondary | ICD-10-CM

## 2013-05-10 DIAGNOSIS — F4310 Post-traumatic stress disorder, unspecified: Secondary | ICD-10-CM

## 2013-05-10 DIAGNOSIS — F332 Major depressive disorder, recurrent severe without psychotic features: Secondary | ICD-10-CM

## 2013-05-10 MED ORDER — DULOXETINE HCL 60 MG PO CPEP: 60.0000 mg | ORAL_CAPSULE | Freq: Every day | ORAL | Status: DC

## 2013-05-10 MED ORDER — LORAZEPAM 1 MG PO TABS: 1.0000 mg | ORAL_TABLET | Freq: Three times a day (TID) | ORAL | Status: DC

## 2013-05-10 MED ORDER — SERTRALINE HCL 100 MG PO TABS: 150.0000 mg | ORAL_TABLET | Freq: Every day | ORAL | Status: DC
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
## 2013-05-22 ENCOUNTER — Ambulatory Visit: Payer: Self-pay | Admitting: Internal Medicine

## 2013-05-23 ENCOUNTER — Ambulatory Visit: Payer: Self-pay | Admitting: Family Medicine

## 2013-05-23 ENCOUNTER — Ambulatory Visit: Payer: Self-pay | Admitting: Internal Medicine

## 2013-05-29 ENCOUNTER — Encounter

## 2013-06-01 ENCOUNTER — Ambulatory Visit: Payer: Self-pay | Admitting: Family Medicine

## 2013-06-01 ENCOUNTER — Ambulatory Visit: Payer: Self-pay | Admitting: Internal Medicine

## 2013-06-05 ENCOUNTER — Encounter

## 2013-06-06 ENCOUNTER — Telehealth: Payer: Self-pay | Admitting: Endocrinology

## 2013-06-06 MED ORDER — INSULIN PEN NEEDLE 32G X 4 MM MISC: Status: DC

## 2013-06-06 NOTE — Telephone Encounter (Signed)
Pt would like a RX called into [facility]  Pen needles levemir flex pen  Ok to leave a message on answering machine after 5:30 pm   Call Back: [phone]  Thank You :)

## 2013-06-06 NOTE — Telephone Encounter (Signed)
Called pt. Pen needles given for a 90 day supply.

## 2013-06-07 ENCOUNTER — Telehealth: Payer: Self-pay | Admitting: Endocrinology

## 2013-06-07 NOTE — Telephone Encounter (Signed)
Pt state the instructions are wrong on her pen needles.

## 2013-06-08 ENCOUNTER — Ambulatory Visit (INDEPENDENT_AMBULATORY_CARE_PROVIDER_SITE_OTHER): Payer: BC Managed Care – PPO | Admitting: Internal Medicine

## 2013-06-08 ENCOUNTER — Encounter: Payer: Self-pay | Admitting: Internal Medicine

## 2013-06-08 VITALS — BP 130/72 | HR 96 | Ht 64.5 in | Wt 185.8 lb

## 2013-06-08 DIAGNOSIS — J209 Acute bronchitis, unspecified: Secondary | ICD-10-CM

## 2013-06-08 DIAGNOSIS — J45901 Unspecified asthma with (acute) exacerbation: Secondary | ICD-10-CM

## 2013-06-08 DIAGNOSIS — J4520 Mild intermittent asthma, uncomplicated: Secondary | ICD-10-CM

## 2013-06-08 DIAGNOSIS — E1165 Type 2 diabetes mellitus with hyperglycemia: Secondary | ICD-10-CM

## 2013-06-08 DIAGNOSIS — IMO0001 Reserved for inherently not codable concepts without codable children: Secondary | ICD-10-CM

## 2013-06-08 DIAGNOSIS — J45909 Unspecified asthma, uncomplicated: Secondary | ICD-10-CM

## 2013-06-08 DIAGNOSIS — J441 Chronic obstructive pulmonary disease with (acute) exacerbation: Secondary | ICD-10-CM

## 2013-06-08 MED ORDER — DOXYCYCLINE HYCLATE 100 MG PO TABS: ORAL_TABLET | ORAL | Status: DC

## 2013-06-08 MED ORDER — METHYLPREDNISOLONE ACETATE 80 MG/ML IJ SUSP
80.0000 mg | Freq: Once | INTRAMUSCULAR | Status: AC
  Administered 2013-06-08: 80 mg via INTRAMUSCULAR

## 2013-06-08 MED ORDER — PROMETHAZINE-CODEINE 6.25-10 MG/5ML PO SYRP: 5.0000 mL | ORAL_SOLUTION | Freq: Two times a day (BID) | ORAL | Status: DC | PRN

## 2013-06-08 MED ORDER — LEVALBUTEROL HCL 0.63 MG/3ML IN NEBU
0.6300 mg | INHALATION_SOLUTION | Freq: Once | RESPIRATORY_TRACT | Status: AC
  Administered 2013-06-08: 0.63 mg via RESPIRATORY_TRACT
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
## 2013-06-08 NOTE — Telephone Encounter (Signed)
Pharmacy contacted and needles will be filled.

## 2013-06-08 NOTE — Patient Instructions (Signed)
Neb xop 0.63  Depo 80  Script sent for doxycycline  Script printed for prometh codeine cough syrup  Please call as needed

## 2013-06-08 NOTE — Telephone Encounter (Signed)
Pharmacy notified.

## 2013-06-09 ENCOUNTER — Telehealth: Payer: Self-pay | Admitting: *Deleted

## 2013-06-09 NOTE — Telephone Encounter (Signed)
i only see pt for DM, and this drug is outside the scope of my practice

## 2013-06-09 NOTE — Telephone Encounter (Signed)
Patient phoned [facility] triage line requesting refill on primodone.  Please advise.  CB# [phone]

## 2013-06-12 ENCOUNTER — Telehealth: Payer: Self-pay | Admitting: Internal Medicine

## 2013-06-12 MED ORDER — PREDNISONE 20 MG PO TABS: 20.0000 mg | ORAL_TABLET | Freq: Every day | ORAL | Status: DC

## 2013-06-12 MED ORDER — PRIMIDONE 50 MG PO TABS: 150.0000 mg | ORAL_TABLET | Freq: Two times a day (BID) | ORAL | Status: DC

## 2013-06-12 NOTE — Telephone Encounter (Signed)
Medication approved and sent to pharmacy

## 2013-06-12 NOTE — Telephone Encounter (Signed)
Pt ware of recs. rx sent. Nothing further needed

## 2013-06-12 NOTE — Telephone Encounter (Signed)
Per CY-offer prednisone 20 mg #7 take 1 po qd no refills. Thanks.

## 2013-06-12 NOTE — Telephone Encounter (Signed)
Spoke with [name]. She c/o prod cough w/ yellow phlem, wheezing, slight chest tx, nasal congestion, PND. No increase SOB. She is still on doxy and taking promethazine w/ codeine cough syrup. No f/c/s/n/v. Please advise Dr. Annamaria Boots thanks  Allergies  Allergen Reactions  . Metformin And Related     diarrhea  . Guaifenesin     MUCINEX REACTION: asthma attacks  . Morphine And Related Other (See Comments)    [name]. States med makes her crazy  . Oxycodone-Acetaminophen     TYLOX.  . Guaifenesin Er     Current Outpatient Prescriptions on File Prior to Visit  Medication Sig Dispense Refill  . albuterol (PROAIR HFA) 108 (90 BASE) MCG/ACT inhaler Inhale 2 puffs into the lungs every 6 (six) hours as needed for wheezing.      Marland Kitchen albuterol (PROVENTIL) (2.5 MG/3ML) 0.083% nebulizer solution Take 2.5 mg by nebulization every 6 (six) hours as needed for wheezing or shortness of breath.      Marland Kitchen aspirin EC 81 MG tablet Take 81 mg by mouth every morning.       . cyclobenzaprine (FLEXERIL) 10 MG tablet Take 0.5 tablets (5 mg total) by mouth 3 (three) times daily as needed for muscle spasms.  30 tablet  1  . doxycycline (VIBRA-TABS) 100 MG tablet 2 today then one daily  8 tablet  1  . DULoxetine (CYMBALTA) 60 MG capsule Take 1 capsule (60 mg total) by mouth daily.  30 capsule  2  . fluticasone-salmeterol (ADVAIR HFA) 115-21 MCG/ACT inhaler Inhale 2 puffs into the lungs 2 (two) times daily.      . insulin detemir (LEVEMIR) 100 UNIT/ML injection Inject 1.6 mLs (160 Units total) into the skin daily.  10 mL  1  . Insulin Pen Needle (CAREFINE PEN NEEDLES) 32G X 4 MM MISC Use 2 times a day.  200 each  0  . levothyroxine (SYNTHROID, LEVOTHROID) 75 MCG tablet Take 75 mcg by mouth daily before breakfast.      . LORazepam (ATIVAN) 1 MG tablet Take 1 tablet (1 mg total) by mouth 3 (three) times daily.  90 tablet  2  . metoprolol succinate (TOPROL-XL) 50 MG 24 hr tablet Take 1 tablet (50 mg total) by mouth 2 (two) times daily. Take  with or immediately following a meal.  60 tablet  11  . mometasone (NASONEX) 50 MCG/ACT nasal spray Place 2 sprays into the nose every morning.       . naproxen (NAPROSYN) 500 MG tablet       . potassium chloride SA (K-DUR,KLOR-CON) 20 MEQ tablet Take 1 tablet (20 mEq total) by mouth 2 (two) times daily.  60 tablet  1  . primidone (MYSOLINE) 50 MG tablet Take 3 tablets (150 mg total) by mouth 2 (two) times daily.  180 tablet  5  . promethazine-codeine (PHENERGAN WITH CODEINE) 6.25-10 MG/5ML syrup Take 5 mLs by mouth 2 (two) times daily as needed for cough.  120 mL  0  . sertraline (ZOLOFT) 100 MG tablet Take 1.5 tablets (150 mg total) by mouth daily.  45 tablet  2   No current facility-administered medications on file prior to visit.

## 2013-06-14 ENCOUNTER — Encounter: Payer: Self-pay | Admitting: Family Medicine

## 2013-06-14 ENCOUNTER — Encounter: Payer: Self-pay | Admitting: Internal Medicine

## 2013-06-14 ENCOUNTER — Other Ambulatory Visit (INDEPENDENT_AMBULATORY_CARE_PROVIDER_SITE_OTHER): Payer: BC Managed Care – PPO

## 2013-06-14 ENCOUNTER — Ambulatory Visit (INDEPENDENT_AMBULATORY_CARE_PROVIDER_SITE_OTHER): Payer: BC Managed Care – PPO | Admitting: Family Medicine

## 2013-06-14 ENCOUNTER — Ambulatory Visit (INDEPENDENT_AMBULATORY_CARE_PROVIDER_SITE_OTHER): Payer: BC Managed Care – PPO | Admitting: Internal Medicine

## 2013-06-14 VITALS — BP 134/84 | HR 99 | Temp 98.6°F | Resp 16 | Ht 64.0 in | Wt 184.6 lb

## 2013-06-14 VITALS — BP 134/84 | HR 80 | Temp 98.6°F | Resp 16 | Ht 64.0 in | Wt 184.4 lb

## 2013-06-14 DIAGNOSIS — M654 Radial styloid tenosynovitis [de Quervain]: Secondary | ICD-10-CM

## 2013-06-14 DIAGNOSIS — G250 Essential tremor: Secondary | ICD-10-CM

## 2013-06-14 DIAGNOSIS — M189 Osteoarthritis of first carpometacarpal joint, unspecified: Secondary | ICD-10-CM

## 2013-06-14 DIAGNOSIS — G252 Other specified forms of tremor: Secondary | ICD-10-CM

## 2013-06-14 DIAGNOSIS — E876 Hypokalemia: Secondary | ICD-10-CM

## 2013-06-14 DIAGNOSIS — I10 Essential (primary) hypertension: Secondary | ICD-10-CM

## 2013-06-14 DIAGNOSIS — E1165 Type 2 diabetes mellitus with hyperglycemia: Secondary | ICD-10-CM

## 2013-06-14 DIAGNOSIS — IMO0001 Reserved for inherently not codable concepts without codable children: Secondary | ICD-10-CM

## 2013-06-14 DIAGNOSIS — M19049 Primary osteoarthritis, unspecified hand: Secondary | ICD-10-CM

## 2013-06-14 LAB — BASIC METABOLIC PANEL
BUN: 16 mg/dL (ref 6–23)
CO2: 26 mEq/L (ref 19–32)
Calcium: 8.9 mg/dL (ref 8.4–10.5)
Chloride: 98 mEq/L (ref 96–112)
Creatinine, Ser: 0.8 mg/dL (ref 0.4–1.2)
GFR: 72.41 mL/min (ref >60.00)
Glucose, Bld: 284 mg/dL — ABNORMAL HIGH (ref 70–99)
Potassium: 4 mEq/L (ref 3.5–5.1)
Sodium: 133 mEq/L — ABNORMAL LOW (ref 135–145)

## 2013-06-14 LAB — MAGNESIUM: Magnesium: 2 mg/dL (ref 1.5–2.5)

## 2013-06-14 NOTE — Patient Instructions (Signed)
Hypokalemia Hypokalemia means that the amount of potassium in the blood is lower than normal.Potassium is a chemical, called an electrolyte, that helps regulate the amount of fluid in the body. It also stimulates muscle contraction and helps nerves function properly.Most of the body's potassium is inside of cells, and only a very small amount is in the blood. Because the amount in the blood is so small, minor changes can be life-threatening. CAUSES  Antibiotics.  Diarrhea or vomiting.  Using laxatives too much, which can cause diarrhea.  Chronic kidney disease.  Water pills (diuretics).  Eating disorders (bulimia).  Low magnesium level.  Sweating a lot. SIGNS AND SYMPTOMS  Weakness.  Constipation.  Fatigue.  Muscle cramps.  Mental confusion.  Skipped heartbeats or irregular heartbeat (palpitations).  Tingling or numbness. DIAGNOSIS  Your health care provider can diagnose hypokalemia with blood tests. In addition to checking your potassium level, your health care provider may also check other lab tests. TREATMENT Hypokalemia can be treated with potassium supplements taken by mouth or adjustments in your current medicines. If your potassium level is very low, you may need to get potassium through a vein (IV) and be monitored in the hospital. A diet high in potassium is also helpful. Foods high in potassium are:  Nuts, such as peanuts and pistachios.  Seeds, such as sunflower seeds and pumpkin seeds.  Peas, lentils, and lima beans.  Whole grain and bran cereals and breads.  Fresh fruit and vegetables, such as apricots, avocado, bananas, cantaloupe, kiwi, oranges, tomatoes, asparagus, and potatoes.  Orange and tomato juices.  Red meats.  Fruit yogurt. HOME CARE INSTRUCTIONS  Take all medicines as prescribed by your health care provider.  Maintain a healthy diet by including nutritious food, such as fruits, vegetables, nuts, whole grains, and lean meats.  If  you are taking a laxative, be sure to follow the directions on the label. SEEK MEDICAL CARE IF:  Your weakness gets worse.  You feel your heart pounding or racing.  You are vomiting or having diarrhea.  You are diabetic and having trouble keeping your blood glucose in the normal range. SEEK IMMEDIATE MEDICAL CARE IF:  You have chest pain, shortness of breath, or dizziness.  You are vomiting or having diarrhea for more than 2 days.  You faint. MAKE SURE YOU:   Understand these instructions.  Will watch your condition.  Will get help right away if you are not doing well or get worse. Document Released: 03/23/2005 Document Revised: 01/11/2013 Document Reviewed: 09/23/2012 ExitCare Patient Information 2014 [employer].  

## 2013-06-14 NOTE — Progress Notes (Signed)
   Subjective:    Patient ID: [name], female    DOB: [birth year], 63 y.o.   MRN: [id number]  Hypertension Pertinent negatives include no chest pain, headaches, neck pain, palpitations or shortness of breath. Agents associated with hypertension include steroids and NSAIDs. Past treatments include angiotensin blockers. The current treatment provides significant improvement. There are no compliance problems.       Review of Systems  Constitutional: Negative.  Negative for fever, chills, diaphoresis, appetite change and fatigue.  HENT: Negative.   Eyes: Negative.   Respiratory: Negative.  Negative for choking, chest tightness, shortness of breath, wheezing and stridor.   Cardiovascular: Negative.  Negative for chest pain, palpitations and leg swelling.  Gastrointestinal: Negative.  Negative for nausea, vomiting, abdominal pain, diarrhea, constipation and blood in stool.  Endocrine: Negative.   Genitourinary: Negative.   Musculoskeletal: Positive for arthralgias. Negative for back pain, myalgias, neck pain and neck stiffness.  Skin: Negative.   Allergic/Immunologic: Negative.   Neurological: Positive for tremors. Negative for dizziness, weakness, light-headedness and headaches.  Hematological: Negative.  Negative for adenopathy. Does not bruise/bleed easily.  Psychiatric/Behavioral: Negative.        Objective:   Physical Exam  Vitals reviewed. Constitutional: She is oriented to person, place, and time. She appears well-developed and well-nourished.  Non-toxic appearance. She does not have a sickly appearance. She does not appear ill. No distress.  HENT:  Head: Normocephalic and atraumatic.  Mouth/Throat: Oropharynx is clear and moist. No oropharyngeal exudate.  Eyes: Conjunctivae are normal. Right eye exhibits no discharge. Left eye exhibits no discharge. No scleral icterus.  Neck: Normal range of motion. Neck supple. No JVD present. No tracheal deviation present. No  thyromegaly present.  Cardiovascular: Normal rate, regular rhythm, normal heart sounds and intact distal pulses.  Exam reveals no gallop and no friction rub.   No murmur heard. Pulmonary/Chest: Effort normal and breath sounds normal. No stridor. No respiratory distress. She has no wheezes. She has no rales. She exhibits no tenderness.  Abdominal: Soft. Bowel sounds are normal. She exhibits no distension and no mass. There is no tenderness. There is no rebound and no guarding.  Musculoskeletal: Normal range of motion. She exhibits no edema and no tenderness.  Lymphadenopathy:    She has no cervical adenopathy.  Neurological: She is alert and oriented to person, place, and time. She has normal strength. She displays tremor. She displays no atrophy. No cranial nerve deficit or sensory deficit. She exhibits normal muscle tone. She displays no seizure activity. Gait abnormal.  Skin: Skin is warm and dry. No rash noted. She is not diaphoretic. No erythema. No pallor.     Lab Results  Component Value Date   WBC 9.6 04/11/2013   HGB 13.0 04/11/2013   HCT 37.2 04/11/2013   PLT 185 04/11/2013   GLUCOSE 451* 04/19/2013   CHOL 187 01/18/2013   TRIG 96.0 01/18/2013   HDL 79.70 01/18/2013   LDLCALC 88 01/18/2013   ALT 11 04/11/2013   AST 14 04/11/2013   NA 137 04/19/2013   K 3.1* 04/19/2013   CL 100 04/19/2013   CREATININE 1.2 04/19/2013   BUN 13 04/19/2013   CO2 26 04/19/2013   TSH 1.618 04/06/2013   INR 1.0 02/27/2008   HGBA1C 10.3* 04/19/2013       Assessment & Plan:

## 2013-06-14 NOTE — Progress Notes (Signed)
Pre visit review using our clinic review tool, if applicable. No additional management support is needed unless otherwise documented below in the visit note. 

## 2013-06-14 NOTE — Patient Instructions (Signed)
Good to see you i am glad the therapy helped.  I did an injection today and I think it will help.  Ice 20 minutes 1-2 times a day.  Wear brace only when you need it.  Come back if not perfect in 3-4 weeks.

## 2013-06-15 DIAGNOSIS — M189 Osteoarthritis of first carpometacarpal joint, unspecified: Secondary | ICD-10-CM | POA: Insufficient documentation

## 2013-06-15 MED ORDER — OLMESARTAN MEDOXOMIL 40 MG PO TABS: 40.0000 mg | ORAL_TABLET | Freq: Every day | ORAL | Status: DC

## 2013-06-15 NOTE — Progress Notes (Signed)
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 Patient was put in a brace previously. He should has been going to physical therapy and has noticed some improvement. Patient continues to be icing as well but states that she is only made moderate improvement overall. Patient denies any radiation of the hand or any numbness. Patient states though that the pain seems to be more specific in points of the [facility] joint. Patient found it difficult to do things such as opening up hands right now with this hand secondary to the pain.   Past medical, surgical, family and social history reviewed. Medications reviewed all in the electronic medical record.   Review of Systems: No headache, visual changes, nausea, vomiting, diarrhea, constipation, dizziness, abdominal pain, skin rash, fevers, chills, night sweats, weight loss, swollen lymph nodes, body aches, joint swelling, muscle aches, chest pain, shortness of breath, mood changes.   Objective:    Blood pressure 134/84, pulse 99, temperature 98.6 F (37 C), temperature source Oral, resp. rate 16, height 5\' 4"  (1.626 m), weight 184 lb 9.6 oz (83.734 kg), SpO2 94.00%.   General: No apparent distress alert and oriented x3 mood and affect normal, dressed appropriately. Patient does have a resting tremor HEENT: Pupils equal, extraocular movements intact Respiratory: Patient's speak in full sentences and does not appear short of breath Cardiovascular: No lower extremity edema, non tender, no erythema Skin: Warm dry intact with no signs of infection or rash on extremities or on axial skeleton. Abdomen: Soft nontender Neuro: Cranial nerves II through XII are intact, neurovascularly intact in all extremities with 2+ DTRs and 2+ pulses. Lymph: No lymphadenopathy of posterior or anterior cervical chain or axillae bilaterally.  Gait mild wide-base MSK: Non tender with full range of motion and good  stability and symmetric strength and tone of shoulders, elbows,   knees bilaterally.   Wrist: Right Inspection normal with no visible erythema or swelling. ROM smooth and normal with good flexion and extension and ulnar/radial deviation that is symmetrical with opposite wrist. Patient does have mild discomfort with radial deviation Palpation is normal over metacarpals, navicular, lunate, and TFCC; tendons without tenderness/ swelling Patient does have tenderness over the [facility] joint and positive compression force causes pain. No snuffbox tenderness. No tenderness over [place]. Strength 5/5 in all directions without pain. Equivocal Finkelstein, but negative tinel's and phalens. Negative Watson's test. Contralateral wrist unremarkable  MSK US performed of: Right This study was ordered, performed, and interpreted by Charlann Boxer D.O.  Wrist: All extensor compartments visualized and tendons all normal in appearance without fraying, tears, or sheath effusions. No effusion seen. TFCC intact. Scapholunate ligament intact. Carpal tunnel visualized and median nerve area normal, flexor tendons all normal in appearance without fraying, tears, or sheath effusions. Power doppler signal normal. Patient though does have severe osteoarthritic changes of the [facility] sure  IMPRESSION:  CMC arthritis  Procedure note After verbal consent patient was prepped with alcohol swabs and with a 25-gauge 1 inch needle was injected under ultrasound guidance into the [facility] joint with 0.5 cc of 0.5% Marcaine and 0.5 cc of Kenalog 40 mg/dL. Patient tolerated the procedure well with near complete resolution of pain immediately. Postinjection instructions given.    Impression and Recommendations:     This case required medical decision making of moderate complexity. Spent greater than 25 minutes with patient face-to-face and had greater than 50% of counseling including as described above  in assessment and plan.

## 2013-06-15 NOTE — Assessment & Plan Note (Signed)
Patient is doing remarkably well overall. Patient was given an injection today which did nearly complete patient's resolution of pain. Patient is going to continue to wear the brace for the next 48 hours and can wear it as needed. We discussed icing protocol. Patient was given some their use he range of motion exercises to do at home. Patient will then follow up again in 3-4 weeks for further evaluation.

## 2013-06-15 NOTE — Assessment & Plan Note (Signed)
She wants to see [name] about this

## 2013-06-15 NOTE — Assessment & Plan Note (Signed)
Her BP is well controlled on the ARB and beta/blocker

## 2013-06-15 NOTE — Assessment & Plan Note (Signed)
Her blood sugars are not well controlled but she tells me that she is not able to get any better control than this

## 2013-06-15 NOTE — Assessment & Plan Note (Signed)
Her K+ level is normal now 

## 2013-06-19 ENCOUNTER — Encounter: Payer: Self-pay | Admitting: Neurology

## 2013-06-27 ENCOUNTER — Encounter: Payer: Self-pay | Admitting: Internal Medicine

## 2013-06-27 ENCOUNTER — Ambulatory Visit (INDEPENDENT_AMBULATORY_CARE_PROVIDER_SITE_OTHER): Payer: BC Managed Care – PPO | Admitting: Neurology

## 2013-06-27 ENCOUNTER — Encounter: Payer: Self-pay | Admitting: Neurology

## 2013-06-27 VITALS — BP 168/96 | HR 100 | Resp 18 | Ht 64.5 in | Wt 187.0 lb

## 2013-06-27 DIAGNOSIS — R251 Tremor, unspecified: Secondary | ICD-10-CM

## 2013-06-27 DIAGNOSIS — G252 Other specified forms of tremor: Secondary | ICD-10-CM

## 2013-06-27 DIAGNOSIS — R259 Unspecified abnormal involuntary movements: Secondary | ICD-10-CM

## 2013-06-27 DIAGNOSIS — G250 Essential tremor: Secondary | ICD-10-CM

## 2013-06-27 DIAGNOSIS — E1165 Type 2 diabetes mellitus with hyperglycemia: Secondary | ICD-10-CM

## 2013-06-27 DIAGNOSIS — IMO0001 Reserved for inherently not codable concepts without codable children: Secondary | ICD-10-CM

## 2013-06-27 NOTE — Progress Notes (Signed)
Subjective:    [name] was seen in consultation in the movement disorder clinic at the request of Scarlette Calico, MD.  The evaluation is for tremor.  The patient is a 63 y.o. right handed female with a history of tremor.  She has had tremor since high school (or even before) but it has gotten worse with time.   She states that she was at [facility] as a child and was dx with ET.  She doesn't remember if she was started on medication as a child.  She last saw a neurologist in [place] in 2009 (not a [facility]).  She has been on primidone 150 mg bid for over 10 years but not as long as 20 years.  She is also on toprol but that is for SVT.  She doesn't remember being on other tremor medications.  Both hands shake equally.  She has trouble writing.  She has noted head and vocal tremor for a few years.  There is family hx of tremor in her mother and maybe in father.  No tremor in her children.    Affected by caffeine:  yes (drinks at least 3 cups of coffee in the AM) Affected by alcohol:  yes (decreases it) Affected by stress:  yes Affected by fatigue:  yes Spills soup if on spoon:  yes Spills glass of liquid if full:  yes (uses lids) Affects ADL's (tying shoes, brushing teeth, etc):  yes, some trouble tying shoes  The patient does mention that on 04/05/2013 she may have had a seizure.  I reviewed medical records related to this.  She presented to the emergency room with DKA and a blood pressure of 200/131.  According to records, the patient was not found unresponsive, but the patient states that her husband told her that she was unresponsive.  Nonetheless, she states that she had also ran out of all of her medications, including her blood pressure medication and her primidone, for several days prior to the event.  She had an EEG that was mildly slow, but no evidence of epileptiform activity.  She has had no further events.  Current/Previously tried tremor medications: ativan (takes very rarely for  anxiety), primidone, toprol for svt  Current medications that may exacerbate tremor: albuterol nebulizer (uses it 1-2 times per month and it increases tremor)  Outside reports reviewed: historical medical records and referral letter/letters.  Allergies  Allergen Reactions  . Metformin And Related     diarrhea  . Guaifenesin     MUCINEX REACTION: asthma attacks  . Morphine And Related Other (See Comments)    Pt. States med makes her crazy  . Oxycodone-Acetaminophen     TYLOX.  . Guaifenesin Er     Current Outpatient Prescriptions on File Prior to Visit  Medication Sig Dispense Refill  . albuterol (PROAIR HFA) 108 (90 BASE) MCG/ACT inhaler Inhale 2 puffs into the lungs every 6 (six) hours as needed for wheezing.      Marland Kitchen albuterol (PROVENTIL) (2.5 MG/3ML) 0.083% nebulizer solution Take 2.5 mg by nebulization every 6 (six) hours as needed for wheezing or shortness of breath.      Marland Kitchen aspirin EC 81 MG tablet Take 81 mg by mouth every morning.       . cyclobenzaprine (FLEXERIL) 10 MG tablet Take 0.5 tablets (5 mg total) by mouth 3 (three) times daily as needed for muscle spasms.  30 tablet  1  . DULoxetine (CYMBALTA) 60 MG capsule Take 1 capsule (60 mg total)  by mouth daily.  30 capsule  2  . fluticasone-salmeterol (ADVAIR HFA) 115-21 MCG/ACT inhaler Inhale 2 puffs into the lungs 2 (two) times daily.      . insulin detemir (LEVEMIR) 100 UNIT/ML injection Inject 1.6 mLs (160 Units total) into the skin daily.  10 mL  1  . Insulin Pen Needle (CAREFINE PEN NEEDLES) 32G X 4 MM MISC Use 2 times a day.  200 each  0  . levothyroxine (SYNTHROID, LEVOTHROID) 75 MCG tablet Take 75 mcg by mouth daily before breakfast.      . LORazepam (ATIVAN) 1 MG tablet Take 1 tablet (1 mg total) by mouth 3 (three) times daily.  90 tablet  2  . metoprolol succinate (TOPROL-XL) 50 MG 24 hr tablet Take 1 tablet (50 mg total) by mouth 2 (two) times daily. Take with or immediately following a meal.  60 tablet  11  .  mometasone (NASONEX) 50 MCG/ACT nasal spray Place 2 sprays into the nose every morning.       . naproxen (NAPROSYN) 500 MG tablet       . olmesartan (BENICAR) 40 MG tablet Take 1 tablet (40 mg total) by mouth daily.  90 tablet  3  . potassium chloride SA (K-DUR,KLOR-CON) 20 MEQ tablet Take 1 tablet (20 mEq total) by mouth 2 (two) times daily.  60 tablet  1  . primidone (MYSOLINE) 50 MG tablet Take 3 tablets (150 mg total) by mouth 2 (two) times daily.  180 tablet  5  . promethazine-codeine (PHENERGAN WITH CODEINE) 6.25-10 MG/5ML syrup Take 5 mLs by mouth 2 (two) times daily as needed for cough.  120 mL  0  . sertraline (ZOLOFT) 100 MG tablet Take 1.5 tablets (150 mg total) by mouth daily.  45 tablet  2   No current facility-administered medications on file prior to visit.    Past Medical History  Diagnosis Date  . Hyperlipidemia   . Hypertension   . Diabetes mellitus   . Asthma   . 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 [name], residual palpitations  . Vocal cord dysfunction   . Esophageal reflux   . Tremor, essential     Neurontin 600 mg qhs x 1 yr--no help.  Inderal prior to this was helpful for 10 yrs then stopped working.  . Anxiety   . Allergic rhinitis   . Low sodium syndrome   . Depression     Past Surgical History  Procedure Laterality Date  . Tonsillectomy    . Right breast cyst      benign  . Left ankle ligament repair    . Left elbow repair    . Partial hysterectomy    . Cholecystectomy    . Cesarean section    . Ablation for avnrt    . Colonoscopy  01/16/2004    [id number] rectum/colon  . Esophagogastroduodenoscopy (egd) with esophageal dilation  04/03/2002    [id number] ring, otherwise normal esophagus, status post dilation with 54 F/Normal stomach  . Bartholin gland cyst excision    . Esophagogastroduodenoscopy (egd) with esophageal dilation N/A 11/08/2012    Procedure: ESOPHAGOGASTRODUODENOSCOPY (EGD) WITH  ESOPHAGEAL DILATION;  Surgeon: Daneil Dolin, MD;  Location: [facility];  Service: Endoscopy;  Laterality: N/A;  11:30    History   Social History  . Marital Status: Married    Spouse Name: N/A    Number of Children: N/A  . Years of Education:  N/A   Occupational History  . retired    Social History Main Topics  . Smoking status: Former Smoker -- 0.30 packs/day for 10 years    Types: Cigarettes    Quit date: 04/06/1985  . Smokeless tobacco: Never Used  . Alcohol Use: No  . Drug Use: No  . Sexual Activity: No   Other Topics Concern  . Not on file   Social History Narrative   Married, 1 daughte, living.  Lives with husband in [place], [place].   1 child died brain tumor at age 25.   Two grandchildren.   Works at [employer], patient currently lost her job.   Retired 2011.   No tobacco.   Alcohol: none in 30 yrs.  Distant history of heavy use.   No drug use.    Family Status  Relation Status Death Age  . Father Deceased     lung cancer, diabetes  . Mother Deceased     COPD, diabetes  . Son Deceased 7    brain tumor (neuroblastoma)  . Sister Deceased     blood clot  . Daughter Alive     healthy    Review of Systems A complete 10 system ROS was obtained and was negative apart from what is mentioned.   Objective:   VITALS:   Filed Vitals:   06/27/13 0955  BP: 168/96  Pulse: 100  Resp: 18  Height: 5' 4.5" (1.638 m)  Weight: 187 lb (84.823 kg)   Gen:  Appears stated age and in NAD. HEENT:  Normocephalic, atraumatic. The mucous membranes are moist. The superficial temporal arteries are without ropiness or tenderness. Cardiovascular: Regular rate and rhythm. Lungs: Clear to auscultation bilaterally. Neck: There are no carotid bruits noted bilaterally.  NEUROLOGICAL:  Orientation:  The patient is alert and oriented x 3.  Recent and remote memory are intact.  Attention span and concentration are normal.  Able to name objects and repeat without  trouble.  Fund of knowledge is appropriate Cranial nerves: There is good facial symmetry. The pupils are equal round and reactive to light bilaterally. Fundoscopic exam is attempted but the disc margins are not well visualized bilaterally. Extraocular muscles are intact and visual fields are full to confrontational testing. Speech is fluent and clear. There is a vocal tremor and just slight pseudobulbar quality to the voice.  Soft palate rises symmetrically and there is no tongue deviation. Hearing is intact to conversational tone. Tone: Tone is good throughout. Sensation: Sensation is intact to light touch and pinprick throughout (facial, trunk, extremities). Vibration is markedly decreased distally. There is no extinction with double simultaneous stimulation. There is no sensory dermatomal level identified. Coordination:  The patient has no dysdiadichokinesia or dysmetria. Motor: Strength is 5/5 in the bilateral upper and lower extremities.  Shoulder shrug is equal bilaterally.  There is no pronator drift.  There are no fasciculations noted. DTR's: Deep tendon reflexes are 2/4 at the bilateral biceps, triceps, brachioradialis, patella and absent at the bilateral achilles.  Plantar responses are downgoing bilaterally. Gait and Station: The patient is able to ambulate without difficulty. The patient is able to heel toe walk without any difficulty. The patient is able to ambulate in a tandem fashion. The patient is able to stand in the Romberg position.   MOVEMENT EXAM: Tremor:  There is tremor in the UE, noted most significantly with action.  The patient is not able to draw Archimedes spirals without significant difficulty.  There is head tremor at  rest that is complex head titubation.  The patient is not able to pour water from one glass to another without spilling it.  LABS:  Lab Results  Component Value Date   HGBA1C 10.3* 04/19/2013     Lab Results  Component Value Date   TSH 1.618 04/06/2013     Lab Results  Component Value Date   VITAMINB12 416 04/06/2013      Assessment/Plan:   1.  Essential Tremor.  -This is evidenced by the symmetrical nature and longstanding hx of gradually getting worse.  It is quite severe.  She is already on high dose primidone as well as a beta blocker.  I talked to her about pathophysiology, prognosis and various treatment options.  I am not sure that we will ever get good control over her tremor with medication alone.  She and I talked about DBS.  We talked extensively about the logistics of DBS as well as risks and benefits.  We spent a good amount of time talking about the risk of infection, especially given her diabetes.  We talked about the importance of diabetic control.  I talked to her about the fact that she would need medical clearance for the surgery.  She is interested and would like to look into this option.  I will, therefore, send her for neuropsychologic testing.  I would like to send her for MRI, but the patient believes that the metal rod in her left arm is not MRI compatible.  She will let me know.  If not, then we certainly can proceed with CT.  Greater than 50% of 80 min visit in counseling, as above, and coordinating care.  Patient education in the form of handouts and a DVD was provided.  I offered to let her talk to a patient who has had DBS as well as a [employer] representative, but she did not feel that this was necessary. 2.  Encephalopathy in 03/2013  -IF a seizure was involved, it was likely due to a combination of stopping the primidone "cold [place]", a BP of 200/131 (? PRES), and DKA.  She has been event free since then and no AED are recommended.   3.  Follow up in next few months, sooner should new neurologic issues arise.

## 2013-06-27 NOTE — Assessment & Plan Note (Signed)
Probably  Triggered exacerbation.  Plan fluids, nebulizer xopenex, depomedrol, doxycycline.

## 2013-06-27 NOTE — Assessment & Plan Note (Signed)
Managed elsewhere 

## 2013-06-27 NOTE — Patient Instructions (Signed)
1. You have been referred to Neuro Psych with Dr Nathanial Rancher. They will call you directly to schedule an appointment.  Please call [phone] if you do not hear from them.  2. Please let us know if you can have an MRI.  3. Follow up 2 months.

## 2013-07-10 ENCOUNTER — Ambulatory Visit: Payer: Self-pay | Admitting: Internal Medicine

## 2013-07-10 ENCOUNTER — Telehealth: Payer: Self-pay | Admitting: Neurology

## 2013-07-10 NOTE — Telephone Encounter (Signed)
Called number below with no answer and no machine to leave message.

## 2013-07-10 NOTE — Telephone Encounter (Signed)
Spoke with Kenney Houseman and she is faxing information about elbow implant to see if patient okay to have MR.

## 2013-07-10 NOTE — Telephone Encounter (Signed)
Please call Tonya w/ Dr. Johna Sheriff office. Re: elbow implant. CB# [phone] / Sherri S.

## 2013-07-11 NOTE — Telephone Encounter (Signed)
Information received and forwarded to Dr Tat to see if safe to use for MR to see if we can change scheduled CT to MR.

## 2013-07-11 NOTE — Telephone Encounter (Signed)
Called [facility] to check on saftety of CoCr and Polyethelyne for MR- they are checking on this and will call me back.

## 2013-07-11 NOTE — Telephone Encounter (Signed)
[name] called back from [place] and stated that [name] reviewed and stated this was safe for MR. Dr Tat- would you like me to schedule MR now or wait?

## 2013-07-11 NOTE — Telephone Encounter (Signed)
Per Dr Tat will wait to schedule MR til after Neuro/psych testing.

## 2013-07-13 ENCOUNTER — Telehealth: Payer: Self-pay | Admitting: Neurology

## 2013-07-13 NOTE — Telephone Encounter (Signed)
Spoke with patient and made her aware the information about her elbow implant was reviewed by radiology and they stated this was safe for MRI. We will schedule after we receive results of neuro psych testing.

## 2013-07-13 NOTE — Telephone Encounter (Signed)
Pt states that she can not have a MRI but can have CT Scan please call [phone]

## 2013-07-17 NOTE — Telephone Encounter (Signed)
Error

## 2013-07-24 ENCOUNTER — Encounter

## 2013-08-01 ENCOUNTER — Telehealth: Payer: Self-pay | Admitting: Internal Medicine

## 2013-08-01 NOTE — Telephone Encounter (Signed)
Pt c/o prod cough (yellow), ribcage sore, some sob and wheezing.  Denies fever.  Gave appt with TP on 08/03/13 at 2:30

## 2013-08-03 ENCOUNTER — Encounter: Payer: Self-pay | Admitting: Endocrinology

## 2013-08-03 ENCOUNTER — Other Ambulatory Visit: Payer: Self-pay | Admitting: Neurology

## 2013-08-03 ENCOUNTER — Ambulatory Visit (INDEPENDENT_AMBULATORY_CARE_PROVIDER_SITE_OTHER): Payer: BC Managed Care – PPO | Admitting: Endocrinology

## 2013-08-03 ENCOUNTER — Ambulatory Visit (INDEPENDENT_AMBULATORY_CARE_PROVIDER_SITE_OTHER): Payer: BC Managed Care – PPO | Admitting: Adult Health

## 2013-08-03 ENCOUNTER — Encounter: Payer: Self-pay | Admitting: Adult Health

## 2013-08-03 VITALS — BP 154/98 | HR 107 | Temp 98.8°F | Ht 64.5 in | Wt 198.0 lb

## 2013-08-03 VITALS — BP 122/88 | HR 99 | Temp 98.2°F | Ht 64.0 in | Wt 195.0 lb

## 2013-08-03 DIAGNOSIS — E1165 Type 2 diabetes mellitus with hyperglycemia: Principal | ICD-10-CM

## 2013-08-03 DIAGNOSIS — G250 Essential tremor: Secondary | ICD-10-CM

## 2013-08-03 DIAGNOSIS — J069 Acute upper respiratory infection, unspecified: Secondary | ICD-10-CM | POA: Insufficient documentation

## 2013-08-03 DIAGNOSIS — IMO0001 Reserved for inherently not codable concepts without codable children: Secondary | ICD-10-CM

## 2013-08-03 LAB — HEMOGLOBIN A1C: Hgb A1c MFr Bld: 8.6 % — ABNORMAL HIGH (ref 4.6–6.5)

## 2013-08-03 MED ORDER — LEVALBUTEROL HCL 0.63 MG/3ML IN NEBU
0.6300 mg | INHALATION_SOLUTION | Freq: Once | RESPIRATORY_TRACT | Status: AC
  Administered 2013-08-03: 0.63 mg via RESPIRATORY_TRACT

## 2013-08-03 MED ORDER — PROMETHAZINE-CODEINE 6.25-10 MG/5ML PO SYRP: 5.0000 mL | ORAL_SOLUTION | Freq: Two times a day (BID) | ORAL | Status: DC | PRN

## 2013-08-03 MED ORDER — INSULIN PEN NEEDLE 32G X 4 MM MISC: Status: DC

## 2013-08-03 MED ORDER — AZITHROMYCIN 250 MG PO TABS: ORAL_TABLET | ORAL | Status: AC

## 2013-08-03 MED ORDER — ALBUTEROL SULFATE HFA 108 (90 BASE) MCG/ACT IN AERS: 2.0000 | INHALATION_SPRAY | Freq: Four times a day (QID) | RESPIRATORY_TRACT | Status: DC | PRN

## 2013-08-03 MED ORDER — INSULIN DETEMIR 100 UNIT/ML ~~LOC~~ SOLN: 220.0000 [IU] | Freq: Every day | SUBCUTANEOUS | Status: DC
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
## 2013-08-03 NOTE — Assessment & Plan Note (Signed)
URI with mild asthma flare  xopenex neb x 1   Plan  Zpack take as directed.  Mucinex DM Twice daily  As needed  Cough/congestion  Claritin 10mg  At bedtime  As needed  Drainage  Phenergan VC cough syrup 1 tsp every 6hr as needed.  Please contact office for sooner follow up if symptoms do not improve or worsen or seek emergency care  Follow up Dr. Annamaria Boots  As planned and As needed

## 2013-08-03 NOTE — Addendum Note (Signed)
Addended by: Parke Poisson E on: 08/03/2013 02:35 PM   Modules accepted: Orders

## 2013-08-03 NOTE — Patient Instructions (Addendum)
check your blood sugar 2 times a day.  vary the time of day when you check, between before the 3 meals, and at bedtime.  also check if you have symptoms of your blood sugar being too high or too low.  please keep a record of the readings and bring it to your next appointment here.  please call us sooner if your blood sugar goes below 70, or if you have a lot of readings over 200.   Please increase levemir to 220 units each morning.  However, if you are going to be active that day, take just 140 units. On this type of insulin schedule, you should eat meals on a regular schedule.  If a meal is missed or significantly delayed, your blood sugar could go low.  Please come back for a follow-up appointment in 3 months.  blood tests are being requested for you today.  We'll contact you with results.

## 2013-08-03 NOTE — Progress Notes (Signed)
Subjective:    Patient ID: [name], female    DOB: [birth year], 63 y.o.   MRN: [id number]  HPI pt returns for f/u of insulin-requiring DM (dx'ed 1994, on a routine blood test; she has mild if any neuropathy of the lower extremities; she is unaware of any associated chronic complications; she has been on insulin since 2012; in November of 2014, she was changed to a simpler qd insulin regimen, due to poor results with multiple daily injections; she was admitted to hospital with DKA in early 2015 after she ran out of insulin; she says this was because her insurance did not pay for it, but this problem has been resolved).  no cbg record, but states cbg's vary from 200-350.  There is no trend throughout the day.   Past Medical History  Diagnosis Date  . Hyperlipidemia   . Hypertension   . Diabetes mellitus   . Asthma   . 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 [name], residual palpitations  . Vocal cord dysfunction   . Esophageal reflux   . Tremor, essential     Neurontin 600 mg qhs x 1 yr--no help.  Inderal prior to this was helpful for 10 yrs then stopped working.  . Anxiety   . Allergic rhinitis   . Low sodium syndrome   . Depression     Past Surgical History  Procedure Laterality Date  . Tonsillectomy    . Right breast cyst      benign  . Left ankle ligament repair    . Left elbow repair    . Partial hysterectomy    . Cholecystectomy    . Cesarean section    . Ablation for avnrt    . Colonoscopy  01/16/2004    OXB:DZHGDJ rectum/colon  . Esophagogastroduodenoscopy (egd) with esophageal dilation  04/03/2002    MEQ:ASTMHDQQ'I ring, otherwise normal esophagus, status post dilation with 56 F/Normal stomach  . Bartholin gland cyst excision    . Esophagogastroduodenoscopy (egd) with esophageal dilation N/A 11/08/2012    Procedure: ESOPHAGOGASTRODUODENOSCOPY (EGD) WITH ESOPHAGEAL DILATION;  Surgeon: Daneil Dolin, MD;  Location: [facility]  ENDO SUITE;  Service: Endoscopy;  Laterality: N/A;  11:30    History   Social History  . Marital Status: Married    Spouse Name: N/A    Number of Children: N/A  . Years of Education: N/A   Occupational History  . retired    Social History Main Topics  . Smoking status: Former Smoker -- 0.30 packs/day for 10 years    Types: Cigarettes    Quit date: 04/06/1985  . Smokeless tobacco: Never Used  . Alcohol Use: No  . Drug Use: No  . Sexual Activity: No   Other Topics Concern  . Not on file   Social History Narrative   Married, 1 daughte, living.  Lives with husband in [place], [place].   1 child died brain tumor at age 42.   Two grandchildren.   Works at [employer], patient currently lost her job.   Retired 2011.   No tobacco.   Alcohol: none in 30 yrs.  Distant history of heavy use.   No drug use.    Current Outpatient Prescriptions on File Prior to Visit  Medication Sig Dispense Refill  . albuterol (PROVENTIL) (2.5 MG/3ML) 0.083% nebulizer solution Take 2.5 mg by nebulization every 6 (six) hours as needed for wheezing or shortness of breath.      Marland Kitchen  aspirin EC 81 MG tablet Take 81 mg by mouth every morning.       . cyclobenzaprine (FLEXERIL) 10 MG tablet Take 0.5 tablets (5 mg total) by mouth 3 (three) times daily as needed for muscle spasms.  30 tablet  1  . DULoxetine (CYMBALTA) 60 MG capsule Take 1 capsule (60 mg total) by mouth daily.  30 capsule  2  . fluticasone-salmeterol (ADVAIR HFA) 115-21 MCG/ACT inhaler Inhale 2 puffs into the lungs 2 (two) times daily.      Marland Kitchen levothyroxine (SYNTHROID, LEVOTHROID) 75 MCG tablet Take 75 mcg by mouth daily before breakfast.      . metoprolol succinate (TOPROL-XL) 50 MG 24 hr tablet Take 1 tablet (50 mg total) by mouth 2 (two) times daily. Take with or immediately following a meal.  60 tablet  11  . mometasone (NASONEX) 50 MCG/ACT nasal spray Place 2 sprays into the nose every morning.       . naproxen (NAPROSYN) 500 MG tablet        . olmesartan (BENICAR) 40 MG tablet Take 1 tablet (40 mg total) by mouth daily.  90 tablet  3  . potassium chloride SA (K-DUR,KLOR-CON) 20 MEQ tablet Take 1 tablet (20 mEq total) by mouth 2 (two) times daily.  60 tablet  1  . primidone (MYSOLINE) 50 MG tablet Take 3 tablets (150 mg total) by mouth 2 (two) times daily.  180 tablet  5  . sertraline (ZOLOFT) 100 MG tablet Take 1.5 tablets (150 mg total) by mouth daily.  45 tablet  2   No current facility-administered medications on file prior to visit.    Allergies  Allergen Reactions  . Metformin And Related     diarrhea  . Guaifenesin     MUCINEX REACTION: asthma attacks  . Morphine And Related Other (See Comments)    Pt. States med makes her crazy  . Oxycodone-Acetaminophen     TYLOX.  . Guaifenesin Er     Family History  Problem Relation Age of Onset  . Lung cancer Father     DIED AGE 35 LUNG CA  . Alcohol abuse Father   . Anxiety disorder Father   . Depression Father   . COPD Mother     DIED AGE 61,MRSA,COPD,PNEUMONIA  . Pneumonia Mother     DIED AGE 61,MRSA,COPD,PNEUMONIA  . Supraventricular tachycardia Mother   . Anxiety disorder Mother   . Depression Mother   . Alcohol abuse Mother   . COPD Brother     AGE 45  . Heart disease Sister     DIED AGE 81, SMOKER,?HEART  . Colon cancer Neg Hx     BP 122/88  Pulse 99  Temp(Src) 98.2 F (36.8 C) (Oral)  Ht 5\' 4"  (1.626 m)  Wt 195 lb (88.451 kg)  BMI 33.46 kg/m2  SpO2 95%  Review of Systems She denies hypoglycemia.  She reports weight gain    Objective:   Physical Exam VITAL SIGNS:  See vs page GENERAL: no distress   Lab Results  Component Value Date   HGBA1C 8.6* 08/03/2013       Assessment & Plan:  DM: she needs increased rx.  She can't safely get her a1c below 7%, given this regimen, which does match insulin to her changing needs throughout the day. Weight gain: this complicates the rx of DM.

## 2013-08-03 NOTE — Patient Instructions (Signed)
Zpack take as directed.  Mucinex DM Twice daily  As needed  Cough/congestion  Claritin 10mg  At bedtime  As needed  Drainage  Phenergan VC cough syrup 1 tsp every 6hr as needed.  Please contact office for sooner follow up if symptoms do not improve or worsen or seek emergency care  Follow up Dr. Annamaria Boots  As planned and As needed

## 2013-08-07 ENCOUNTER — Ambulatory Visit (INDEPENDENT_AMBULATORY_CARE_PROVIDER_SITE_OTHER): Payer: BC Managed Care – PPO | Admitting: Psychiatry

## 2013-08-07 ENCOUNTER — Telehealth: Payer: Self-pay | Admitting: Neurology

## 2013-08-07 ENCOUNTER — Encounter (HOSPITAL_COMMUNITY): Payer: Self-pay | Admitting: Psychiatry

## 2013-08-07 VITALS — BP 140/88 | Ht 64.0 in | Wt 193.0 lb

## 2013-08-07 DIAGNOSIS — F4310 Post-traumatic stress disorder, unspecified: Secondary | ICD-10-CM

## 2013-08-07 DIAGNOSIS — F411 Generalized anxiety disorder: Secondary | ICD-10-CM

## 2013-08-07 DIAGNOSIS — F332 Major depressive disorder, recurrent severe without psychotic features: Secondary | ICD-10-CM

## 2013-08-07 MED ORDER — DULOXETINE HCL 60 MG PO CPEP: 60.0000 mg | ORAL_CAPSULE | Freq: Every day | ORAL | Status: DC

## 2013-08-07 MED ORDER — LORAZEPAM 1 MG PO TABS: 1.0000 mg | ORAL_TABLET | Freq: Three times a day (TID) | ORAL | Status: DC | PRN

## 2013-08-07 MED ORDER — SERTRALINE HCL 100 MG PO TABS: 150.0000 mg | ORAL_TABLET | Freq: Every day | ORAL | Status: DC

## 2013-08-07 NOTE — Telephone Encounter (Signed)
Patient made aware of MR appt.

## 2013-08-07 NOTE — Telephone Encounter (Signed)
Left message on machine for patient to call back.
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
## 2013-08-07 NOTE — Telephone Encounter (Signed)
MR 3T protocol Scheduled on 08/14/13 at 1 pm. Arrive at 12:45 pm. Tried to call patient to make her aware with no answer. Will try again later.

## 2013-08-09 ENCOUNTER — Telehealth: Payer: Self-pay | Admitting: Endocrinology

## 2013-08-09 MED ORDER — INSULIN NPH (HUMAN) (ISOPHANE) 100 UNIT/ML ~~LOC~~ SUSP: 130.0000 [IU] | SUBCUTANEOUS | Status: DC

## 2013-08-09 NOTE — Telephone Encounter (Signed)
Pt called stating she will not have any insurance coverage until the first of June. She is not going to be able and afford her insulin. Pt was advised of new [facility] policy that samples were no longer going to be kept at the office. Wanted to know if any changes could be made so her medication would not be as expensive. Please advise, Thanks!

## 2013-08-09 NOTE — Telephone Encounter (Signed)
Cheapest alternative is NPH.  It is not a 1-for-1 conversion, so we have to guess at how much.  i have sent a prescription to your pharmacy.  Please come back for ov at the beginning of June.

## 2013-08-09 NOTE — Telephone Encounter (Signed)
Patient states she has had a glitch in her insurance and wants to know if we have any samples   Thank You :)

## 2013-08-09 NOTE — Telephone Encounter (Signed)
Patent informed that insulin was changed via voice mail.

## 2013-08-10 ENCOUNTER — Telehealth: Payer: Self-pay | Admitting: Neurology

## 2013-08-10 NOTE — Telephone Encounter (Signed)
Patient made aware referral sent to Dr Vertell Limber to discuss DBS.

## 2013-08-14 ENCOUNTER — Ambulatory Visit (HOSPITAL_COMMUNITY): Admission: RE | Admit: 2013-08-14 | Payer: BC Managed Care – PPO | Source: Ambulatory Visit

## 2013-08-15 ENCOUNTER — Telehealth: Payer: Self-pay | Admitting: *Deleted

## 2013-08-15 MED ORDER — "INSULIN SYRINGE-NEEDLE U-100 30G X 1/2"" 0.5 ML MISC": Status: DC

## 2013-08-15 NOTE — Telephone Encounter (Signed)
Rx sent for syringes.

## 2013-08-23 ENCOUNTER — Other Ambulatory Visit: Payer: Self-pay | Admitting: Neurology

## 2013-08-23 ENCOUNTER — Ambulatory Visit (HOSPITAL_COMMUNITY)
Admission: RE | Admit: 2013-08-23 | Discharge: 2013-08-23 | Disposition: A | Discharge status: Home / Self Care | Payer: BC Managed Care – PPO | Source: Ambulatory Visit | Attending: Neurology | Admitting: Neurology

## 2013-08-23 DIAGNOSIS — G250 Essential tremor: Secondary | ICD-10-CM

## 2013-08-23 DIAGNOSIS — G252 Other specified forms of tremor: Secondary | ICD-10-CM

## 2013-08-23 DIAGNOSIS — Z01818 Encounter for other preprocedural examination: Secondary | ICD-10-CM | POA: Insufficient documentation

## 2013-08-23 LAB — POCT I-STAT CREATININE: Creatinine, Ser: 1 mg/dL (ref 0.50–1.10)

## 2013-08-23 MED ORDER — GADOBENATE DIMEGLUMINE 529 MG/ML IV SOLN
20.0000 mL | Freq: Once | INTRAVENOUS | Status: AC
  Administered 2013-08-23: 17 mL via INTRAVENOUS

## 2013-08-24 ENCOUNTER — Telehealth: Payer: Self-pay | Admitting: Endocrinology

## 2013-08-24 NOTE — Telephone Encounter (Signed)
Called pt. She states that her blood sugar is good in the mornings, but drops low in the 60's in the evenings. Pt reports sugars have been this way for about 1 week. Pt is taking 130 units of Novolin.  Please advise, Thanks!

## 2013-08-24 NOTE — Telephone Encounter (Signed)
Pts advised.

## 2013-08-24 NOTE — Telephone Encounter (Signed)
Ok, please reduce to 120 units qam.  i'll see you next time.

## 2013-08-24 NOTE — Telephone Encounter (Signed)
Patient states 130 units novolin in am and blood sugar is dropping to 65  Please advise patient   Thank you

## 2013-08-25 ENCOUNTER — Telehealth: Payer: Self-pay | Admitting: *Deleted

## 2013-08-25 NOTE — Telephone Encounter (Signed)
See below and please advise if insulin adjustments should be made. Pt is currently taking 130 units Humulin.  Thank you!

## 2013-08-25 NOTE — Telephone Encounter (Signed)
Sugar drop to 57 this afternoon has not checked since had Orange juice

## 2013-08-25 NOTE — Telephone Encounter (Signed)
Pt advised.

## 2013-08-25 NOTE — Telephone Encounter (Signed)
Please reduce to 120 units qam

## 2013-08-29 ENCOUNTER — Ambulatory Visit (INDEPENDENT_AMBULATORY_CARE_PROVIDER_SITE_OTHER): Payer: BC Managed Care – PPO | Admitting: Neurology

## 2013-08-29 ENCOUNTER — Encounter: Payer: Self-pay | Admitting: Neurology

## 2013-08-29 ENCOUNTER — Encounter: Payer: Self-pay | Admitting: Endocrinology

## 2013-08-29 ENCOUNTER — Ambulatory Visit (INDEPENDENT_AMBULATORY_CARE_PROVIDER_SITE_OTHER): Payer: BC Managed Care – PPO | Admitting: Endocrinology

## 2013-08-29 ENCOUNTER — Telehealth: Payer: Self-pay | Admitting: Neurology

## 2013-08-29 VITALS — BP 150/100 | HR 130 | Ht 64.5 in | Wt 190.0 lb

## 2013-08-29 VITALS — BP 120/90 | HR 127 | Temp 98.8°F | Ht 64.0 in | Wt 189.0 lb

## 2013-08-29 DIAGNOSIS — G252 Other specified forms of tremor: Secondary | ICD-10-CM

## 2013-08-29 DIAGNOSIS — IMO0001 Reserved for inherently not codable concepts without codable children: Secondary | ICD-10-CM

## 2013-08-29 DIAGNOSIS — G250 Essential tremor: Secondary | ICD-10-CM

## 2013-08-29 DIAGNOSIS — E1165 Type 2 diabetes mellitus with hyperglycemia: Principal | ICD-10-CM

## 2013-08-29 NOTE — Progress Notes (Signed)
Subjective:    [name] was seen in consultation in the movement disorder clinic at the request of Scarlette Calico, MD.  The evaluation is for tremor.  The patient is a 63 y.o. right handed female with a history of tremor.  She has had tremor since high school (or even before) but it has gotten worse with time.   She states that she was at [facility] as a child and was dx with ET.  She doesn't remember if she was started on medication as a child.  She last saw a neurologist in [place] in 2009 (not a [facility]).  She has been on primidone 150 mg bid for over 10 years but not as long as 20 years.  She is also on toprol but that is for SVT.  She doesn't remember being on other tremor medications.  Both hands shake equally.  She has trouble writing.  She has noted head and vocal tremor for a few years.  There is family hx of tremor in her mother and maybe in father.  No tremor in her children.    Affected by caffeine:  yes (drinks at least 3 cups of coffee in the AM) Affected by alcohol:  yes (decreases it) Affected by stress:  yes Affected by fatigue:  yes Spills soup if on spoon:  yes Spills glass of liquid if full:  yes (uses lids) Affects ADL's (tying shoes, brushing teeth, etc):  yes, some trouble tying shoes  The patient does mention that on 04/05/2013 she may have had a seizure.  I reviewed medical records related to this.  She presented to the emergency room with DKA and a blood pressure of 200/131.  According to records, the patient was not found unresponsive, but the patient states that her husband told her that she was unresponsive.  Nonetheless, she states that she had also ran out of all of her medications, including her blood pressure medication and her primidone, for several days prior to the event.  She had an EEG that was mildly slow, but no evidence of epileptiform activity.  She has had no further events.  08/29/13 update:  The patient returns today for followup.  Since our last visit, the  patient did have neuropsychometric testing with Dr. Conley Canal.  I did review this.  There was no evidence of cognitive dysfunction that was suggestive of a dementia.  Her anxiety was well treated.  I reviewed her notes with Dr. Loanne Drilling as well.  She did have a history of DKA in early 2015 because she ran out of her insulin.  Her A1c's have been better controlled.  Her A1c earlier in the year was 10.3 and her latest A1c was 8.6.  She does express that she has been trying to get it under better control because she would like to proceed with DBS surgery.  She does state that Saturday morning her blood sugar dropped to 24, but she was still fully awake and aware.  Her pulse rate was very high today, but she states she has run out of her Toprol.  She did see Dr. Loanne Drilling just prior to our visit today and he changed her diabetic medications as her levemir was very expensive.  Current/Previously tried tremor medications: ativan (takes very rarely for anxiety), primidone, toprol for svt  Current medications that may exacerbate tremor: albuterol nebulizer (uses it 1-2 times per month and it increases tremor)  Outside reports reviewed: historical medical records and referral letter/letters.  Allergies  Allergen Reactions  .  Metformin And Related     diarrhea  . Guaifenesin     MUCINEX REACTION: asthma attacks  . Morphine And Related Other (See Comments)    Pt. States med makes her crazy  . Oxycodone-Acetaminophen     TYLOX.  . Guaifenesin Er     Current Outpatient Prescriptions on File Prior to Visit  Medication Sig Dispense Refill  . albuterol (PROAIR HFA) 108 (90 BASE) MCG/ACT inhaler Inhale 2 puffs into the lungs every 6 (six) hours as needed for wheezing or shortness of breath.  8.5 g  5  . albuterol (PROVENTIL) (2.5 MG/3ML) 0.083% nebulizer solution Take 2.5 mg by nebulization every 6 (six) hours as needed for wheezing or shortness of breath.      Marland Kitchen aspirin EC 81 MG tablet Take 81 mg by mouth every  morning.       . cyclobenzaprine (FLEXERIL) 10 MG tablet Take 0.5 tablets (5 mg total) by mouth 3 (three) times daily as needed for muscle spasms.  30 tablet  1  . diphenhydrAMINE (SOMINEX) 25 MG tablet Take 25 mg by mouth as needed for sleep.      . DULoxetine (CYMBALTA) 60 MG capsule Take 1 capsule (60 mg total) by mouth daily.  30 capsule  2  . fluticasone-salmeterol (ADVAIR HFA) 115-21 MCG/ACT inhaler Inhale 2 puffs into the lungs 2 (two) times daily.      . Insulin Syringe-Needle U-100 (B-D INS SYRINGE 0.5CC/30GX1/2") 30G X 1/2" 0.5 ML MISC Use 1 per day to inject insulin.  100 each  1  . levothyroxine (SYNTHROID, LEVOTHROID) 75 MCG tablet Take 75 mcg by mouth daily before breakfast.      . LORazepam (ATIVAN) 1 MG tablet Take 1 tablet (1 mg total) by mouth 3 (three) times daily as needed.  90 tablet  2  . metoprolol succinate (TOPROL-XL) 50 MG 24 hr tablet Take 1 tablet (50 mg total) by mouth 2 (two) times daily. Take with or immediately following a meal.  60 tablet  11  . mometasone (NASONEX) 50 MCG/ACT nasal spray Place 2 sprays into the nose every morning.       . naproxen (NAPROSYN) 500 MG tablet       . olmesartan (BENICAR) 40 MG tablet Take 1 tablet (40 mg total) by mouth daily.  90 tablet  3  . primidone (MYSOLINE) 50 MG tablet Take 3 tablets (150 mg total) by mouth 2 (two) times daily.  180 tablet  5  . promethazine-codeine (PHENERGAN WITH CODEINE) 6.25-10 MG/5ML syrup Take 5 mLs by mouth 2 (two) times daily as needed for cough.  120 mL  0  . sertraline (ZOLOFT) 100 MG tablet Take 1.5 tablets (150 mg total) by mouth daily.  45 tablet  2   No current facility-administered medications on file prior to visit.    Past Medical History  Diagnosis Date  . Hyperlipidemia   . Hypertension   . Diabetes mellitus   . Asthma   . 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 [name], residual palpitations  . Vocal cord dysfunction   . Esophageal reflux     . Tremor, essential     Neurontin 600 mg qhs x 1 yr--no help.  Inderal prior to this was helpful for 10 yrs then stopped working.  . Anxiety   . Allergic rhinitis   . Low sodium syndrome   . Depression     Past Surgical History  Procedure  Laterality Date  . Tonsillectomy    . Right breast cyst      benign  . Left ankle ligament repair    . Left elbow repair    . Partial hysterectomy    . Cholecystectomy    . Cesarean section    . Ablation for avnrt    . Colonoscopy  01/16/2004    DGL:OVFIEP rectum/colon  . Esophagogastroduodenoscopy (egd) with esophageal dilation  04/03/2002    PIR:[id number]'A ring, otherwise normal esophagus, status post dilation with 56 F/Normal stomach  . Bartholin gland cyst excision    . Esophagogastroduodenoscopy (egd) with esophageal dilation N/A 11/08/2012    Procedure: ESOPHAGOGASTRODUODENOSCOPY (EGD) WITH ESOPHAGEAL DILATION;  Surgeon: Daneil Dolin, MD;  Location: [facility];  Service: Endoscopy;  Laterality: N/A;  11:30    History   Social History  . Marital Status: Married    Spouse Name: N/A    Number of Children: N/A  . Years of Education: N/A   Occupational History  . retired    Social History Main Topics  . Smoking status: Former Smoker -- 0.30 packs/day for 10 years    Types: Cigarettes    Quit date: 04/06/1985  . Smokeless tobacco: Never Used  . Alcohol Use: No  . Drug Use: No  . Sexual Activity: No   Other Topics Concern  . Not on file   Social History Narrative   Married, 1 daughte, living.  Lives with husband in [place], [place].   1 child died brain tumor at age 8.   Two grandchildren.   Works at [employer], patient currently lost her job.   Retired 2011.   No tobacco.   Alcohol: none in 30 yrs.  Distant history of heavy use.   No drug use.    Family Status  Relation Status Death Age  . Father Deceased     lung cancer, diabetes  . Mother Deceased     COPD, diabetes  . Son Deceased 7    brain tumor  (neuroblastoma)  . Sister Deceased     blood clot  . Daughter Alive     healthy    Review of Systems A complete 10 system ROS was obtained and was negative apart from what is mentioned.   Objective:   VITALS:   Filed Vitals:   08/29/13 0915  BP: 150/100  Pulse: 130  Height: 5' 4.5" (1.638 m)  Weight: 190 lb (86.183 kg)   Gen:  Appears stated age and in NAD. HEENT:  Normocephalic, atraumatic. The mucous membranes are moist. The superficial temporal arteries are without ropiness or tenderness. Cardiovascular: Regular rate and rhythm. Lungs: Clear to auscultation bilaterally. Neck: There are no carotid bruits noted bilaterally.  NEUROLOGICAL:  Orientation:  The patient is alert and oriented x 3.  Recent and remote memory are intact.  Attention span and concentration are normal.  Able to name objects and repeat without trouble.  Fund of knowledge is appropriate Cranial nerves: There is good facial symmetry. The pupils are equal round and reactive to light bilaterally. Fundoscopic exam is attempted but the disc margins are not well visualized bilaterally. Extraocular muscles are intact and visual fields are full to confrontational testing. Speech is fluent and clear. There is a vocal tremor and just slight pseudobulbar quality to the voice.  Soft palate rises symmetrically and there is no tongue deviation. Hearing is intact to conversational tone. Tone: Tone is good throughout. Sensation: Sensation is intact to light touch touch throughout. Coordination:  The patient has no dysdiadichokinesia or dysmetria. Motor: Strength is 5/5 in the bilateral upper and lower extremities.  Shoulder shrug is equal bilaterally.  There is no pronator drift.  There are no fasciculations noted. Gait and Station: The patient is able to ambulate without difficulty.   MOVEMENT EXAM: Tremor:  There is tremor in the UE, noted most significantly with action.  The patient is not able to draw Archimedes spirals  without significant difficulty.  There is head tremor at rest that is complex head titubation.    LABS:  Lab Results  Component Value Date   HGBA1C 8.6* 08/03/2013     Lab Results  Component Value Date   TSH 1.618 04/06/2013   Lab Results  Component Value Date   VITAMINB12 416 04/06/2013      Assessment/Plan:   1.  Essential Tremor.  -This is evidenced by the symmetrical nature and longstanding hx of gradually getting worse.  It is quite severe.  She and I talked about surgical details today.  We talked about the importance of controlling her diabetes.  We talked about risks of surgical infections.  We talked about risks and benefits of surgery.  We talked about the risks and benefits of unilateral versus bilateral.  We talked about risks of worsening balance with a bilateral procedure.  However, with a unilateral procedure we likely will not get control over head tremor.  In addition, he certainly will not get control over her left hand tremor if we only do the dominant hand, and her left hand is arguably the more severe hand.  She will think about all these issues.  She has an upcoming appointment with Dr. Vertell Limber.  Over the next few months, I want her to work hard on getting her diabetes under good control.  I would anticipate possible surgery at the very end of summer or early fall if Dr. Vertell Limber and her other doctors are agreeable.  She was given a handout on DBS and the start fix system today. 2.  Encephalopathy in 03/2013  -IF a seizure was involved, it was likely due to a combination of stopping the primidone "cold [place]", a BP of 200/131 (? PRES), and DKA.  She has been event free since then and no AED are recommended.   3.  Follow up in next few months, sooner should new neurologic issues arise.

## 2013-08-29 NOTE — Patient Instructions (Addendum)
check your blood sugar 2 times a day.  vary the time of day when you check, between before the 3 meals, and at bedtime.  also check if you have symptoms of your blood sugar being too high or too low.  please keep a record of the readings and bring it to your next appointment here.  please call us sooner if your blood sugar goes below 70, or if you have a lot of readings over 200.   Please increase levemir to 70 units each morning, and 20 units in the evening.  This reduction will enable you to maximize your diet efforts. On this type of insulin schedule, you should eat meals on a regular schedule.  If a meal is missed or significantly delayed, your blood sugar could go low.  Please come back for a follow-up appointment in 2 weeks.

## 2013-08-29 NOTE — Progress Notes (Signed)
Subjective:    Patient ID: [name], female    DOB: [birth year], 63 y.o.   MRN: [id number]  HPI pt returns for f/u of insulin-requiring DM (dx'ed 1994, on a routine blood test; she has mild if any neuropathy of the lower extremities; she is unaware of any associated chronic complications; she has been on insulin since 2012; in November of 2014, she was changed to a simpler qd insulin regimen, due to poor results with multiple daily injections; she was admitted to hospital with DKA in early 2015 after she ran out of insulin; she says this was because her insurance did not pay for it, but this problem has been resolved; she takes human insulin, due to cost; she has never had GDM or pancreatitis).  Last week, she called [place] to report an episode of severe hypoglycemia (pt called 911 herself).  Insulin was reduced to 120 units daily.  She had another episode despite the reduction.  She feels these episodes are due to a renewed dietary effort, in turn due to her desire to be treated for her tremor.  she brings a record of her cbg's which i have reviewed today.  It varies from 50-300.  It is highest in am, and lowest in the afternoon.   Past Medical History  Diagnosis Date  . Hyperlipidemia   . Hypertension   . Diabetes mellitus   . Asthma   . 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 [name], residual palpitations  . Vocal cord dysfunction   . Esophageal reflux   . Tremor, essential     Neurontin 600 mg qhs x 1 yr--no help.  Inderal prior to this was helpful for 10 yrs then stopped working.  . Anxiety   . Allergic rhinitis   . Low sodium syndrome   . Depression     Past Surgical History  Procedure Laterality Date  . Tonsillectomy    . Right breast cyst      benign  . Left ankle ligament repair    . Left elbow repair    . Partial hysterectomy    . Cholecystectomy    . Cesarean section    . Ablation for avnrt    . Colonoscopy  01/16/2004   UVO:[id number] rectum/colon  . Esophagogastroduodenoscopy (egd) with esophageal dilation  04/03/2002    IHK:[id number]'O ring, otherwise normal esophagus, status post dilation with 56 F/Normal stomach  . Bartholin gland cyst excision    . Esophagogastroduodenoscopy (egd) with esophageal dilation N/A 11/08/2012    Procedure: ESOPHAGOGASTRODUODENOSCOPY (EGD) WITH ESOPHAGEAL DILATION;  Surgeon: Daneil Dolin, MD;  Location: [facility];  Service: Endoscopy;  Laterality: N/A;  11:30    History   Social History  . Marital Status: Married    Spouse Name: N/A    Number of Children: N/A  . Years of Education: N/A   Occupational History  . retired    Social History Main Topics  . Smoking status: Former Smoker -- 0.30 packs/day for 10 years    Types: Cigarettes    Quit date: 04/06/1985  . Smokeless tobacco: Never Used  . Alcohol Use: No  . Drug Use: No  . Sexual Activity: No   Other Topics Concern  . Not on file   Social History Narrative   Married, 1 daughte, living.  Lives with husband in [name], [place].   1 child died brain tumor at age 31.   Two grandchildren.   Works  at [employer], patient currently lost her job.   Retired 2011.   No tobacco.   Alcohol: none in 30 yrs.  Distant history of heavy use.   No drug use.    Current Outpatient Prescriptions on File Prior to Visit  Medication Sig Dispense Refill  . albuterol (PROAIR HFA) 108 (90 BASE) MCG/ACT inhaler Inhale 2 puffs into the lungs every 6 (six) hours as needed for wheezing or shortness of breath.  8.5 g  5  . albuterol (PROVENTIL) (2.5 MG/3ML) 0.083% nebulizer solution Take 2.5 mg by nebulization every 6 (six) hours as needed for wheezing or shortness of breath.      Marland Kitchen aspirin EC 81 MG tablet Take 81 mg by mouth every morning.       . cyclobenzaprine (FLEXERIL) 10 MG tablet Take 0.5 tablets (5 mg total) by mouth 3 (three) times daily as needed for muscle spasms.  30 tablet  1  . diphenhydrAMINE (SOMINEX) 25 MG  tablet Take 25 mg by mouth as needed for sleep.      . DULoxetine (CYMBALTA) 60 MG capsule Take 1 capsule (60 mg total) by mouth daily.  30 capsule  2  . fluticasone-salmeterol (ADVAIR HFA) 115-21 MCG/ACT inhaler Inhale 2 puffs into the lungs 2 (two) times daily.      . Insulin Syringe-Needle U-100 (B-D INS SYRINGE 0.5CC/30GX1/2") 30G X 1/2" 0.5 ML MISC Use 1 per day to inject insulin.  100 each  1  . levothyroxine (SYNTHROID, LEVOTHROID) 75 MCG tablet Take 75 mcg by mouth daily before breakfast.      . LORazepam (ATIVAN) 1 MG tablet Take 1 tablet (1 mg total) by mouth 3 (three) times daily as needed.  90 tablet  2  . metoprolol succinate (TOPROL-XL) 50 MG 24 hr tablet Take 1 tablet (50 mg total) by mouth 2 (two) times daily. Take with or immediately following a meal.  60 tablet  11  . mometasone (NASONEX) 50 MCG/ACT nasal spray Place 2 sprays into the nose every morning.       . naproxen (NAPROSYN) 500 MG tablet       . olmesartan (BENICAR) 40 MG tablet Take 1 tablet (40 mg total) by mouth daily.  90 tablet  3  . primidone (MYSOLINE) 50 MG tablet Take 3 tablets (150 mg total) by mouth 2 (two) times daily.  180 tablet  5  . promethazine-codeine (PHENERGAN WITH CODEINE) 6.25-10 MG/5ML syrup Take 5 mLs by mouth 2 (two) times daily as needed for cough.  120 mL  0  . sertraline (ZOLOFT) 100 MG tablet Take 1.5 tablets (150 mg total) by mouth daily.  45 tablet  2  . ranitidine (ZANTAC) 150 MG tablet Take 150 mg by mouth daily.       No current facility-administered medications on file prior to visit.    Allergies  Allergen Reactions  . Metformin And Related     diarrhea  . Guaifenesin     MUCINEX REACTION: asthma attacks  . Morphine And Related Other (See Comments)    Pt. States med makes her crazy  . Oxycodone-Acetaminophen     TYLOX.  . Guaifenesin Er     Family History  Problem Relation Age of Onset  . Lung cancer Father     DIED AGE 76 LUNG CA  . Alcohol abuse Father   . Anxiety  disorder Father   . Depression Father   . COPD Mother     DIED AGE 97,MRSA,COPD,PNEUMONIA  .  Pneumonia Mother     DIED AGE 94,MRSA,COPD,PNEUMONIA  . Supraventricular tachycardia Mother   . Anxiety disorder Mother   . Depression Mother   . Alcohol abuse Mother   . COPD Brother     AGE 28  . Heart disease Sister     DIED AGE 38, SMOKER,?HEART  . Colon cancer Neg Hx     BP 120/90  Pulse 127  Temp(Src) 98.8 F (37.1 C) (Oral)  Ht 5\' 4"  (1.626 m)  Wt 189 lb (85.73 kg)  BMI 32.43 kg/m2  SpO2 97%  Review of Systems She denies weight change and LOC.     Objective:   Physical Exam VITAL SIGNS:  See vs page GENERAL: no distress Pulses: dorsalis pedis intact bilat.   Feet: no deformity. normal color and temp.  no edema Skin:  no ulcer on the feet.   Neuro: sensation is intact to touch on the feet  Lab Results  Component Value Date   HGBA1C 8.6* 08/03/2013       Assessment & Plan:  DM: severe exacerbation of hypoglycemia, due to insulin.   Tremor: pt's renewed dietary effort is causing hypoglycemia.  We'll further reduce the insulin.    Patient Instructions  check your blood sugar 2 times a day.  vary the time of day when you check, between before the 3 meals, and at bedtime.  also check if you have symptoms of your blood sugar being too high or too low.  please keep a record of the readings and bring it to your next appointment here.  please call us sooner if your blood sugar goes below 70, or if you have a lot of readings over 200.   Please increase levemir to 70 units each morning, and 20 units in the evening.  This reduction will enable you to maximize your diet efforts. On this type of insulin schedule, you should eat meals on a regular schedule.  If a meal is missed or significantly delayed, your blood sugar could go low.  Please come back for a follow-up appointment in 2 weeks.

## 2013-08-29 NOTE — Patient Instructions (Signed)
Deep Brain Stimulation  Is it the right choice for me?   What is Deep Brain Stimulation (DBS) Surgery?  DBS is a surgical procedure used to treat symptoms of Essential Tremor (ET). It involves the implantation of an electrode into the brain either on one side or both sides. The area of the brain that is typically targeted in ET is the ventral intermediate (VIM) nucleus of the thalamus.   How does DBS work?  The tremor in ET is due to an overactive and oscillating circuit in the brain. With DBS, electricity is sent down the electrodes into this area of the brain to disrupt this abnormal circuit, thereby blocking the tremor. It is important to remember, however, that DBS does not "cure" the disease, but rather is a method of treating the symptoms.   What is involved in the procedure?  The surgical procedure involves the implantation of either one (to treat tremor on one side of the body) or two (to treat tremor on both sides of the body) electrodes. The electrodes are connected to a wire, which is then connected to a generator (either one or two) in the chest. The generator (and wires) are placed under the skin similar to a cardiac pacemaker, thus the device itself is not visible. There will,  however, be a visible lump under the skin where the pacemaker is placed. There will also be small bumps on the scalp where the electrode is secured to the skull.   What symptoms can I expect DBS to treat?  In ET the main symptom is the tremor which the DBS system will treat.   What is the downside to having DBS surgery?  There are 2 major factors that need to be considered prior to having surgery, 1) Risks involved, and 2) the "inconvenience factor".   What are the risks of surgery?  Because the surgery involves introducing a foreign object into the brain, there are inherent risks that are present. First, there is a very small risk, approximately 1-2% chance, of having bleeding into the brain causing  symptoms similar to that of a stroke. Secondly, there is a 5-10% chance of having an infection related to the procedure. If the device gets infected, then the treatment usually requires that the infected hardware be removed temporarily while antibiotics are given. After the infection is resolved, then the hardware needs to be re-implanted. This would not leave the patient with permanent problems, but it is easy to understand how disappointed someone might be if they have to go through the surgery again. Symptoms of infection include redness, swelling, or pain around the device. Another risk of brain surgery includes possible seizure. Seizures are abnormal electrical discharges of brain cells. If a seizure occurs, it is almost always at the time of operation. It may require temporarily being treated with seizure medications, but this is typically only short term. Very rarely (much less than 1%) does the infection become more serious and involve the brain.  In some patients with ET we will place electrodes on both sides of the brain to treat tremor on both sides of the body. In these patients there is a 30% chance of causing speech or balance problems when both sides are fully activated. To get rid of this side effect, the intensity of stimulation usually needs to be lowered in one of the electrodes. If this is done, the side effect may well resolve, but some of the tremor may return.  If the electrodes are perfectly placed   and programmed, we can expect an impressive improvement in your tremor. If they are close, but not quite perfectly placed, then there may be some residual tremor  which does not resolve with treatment. This is the main reason we go to such lengths the day of the electrode implantation to be sure we have the electrode optimally placed.   What is the "inconvenience factor"?  Unfortunately, undergoing DBS surgery is a process involving multiple steps. Even prior to surgery, you will have several  visits (which are explained in detail on the subsequent page). The surgery itself takes place in three separate parts. About a week prior to insertion of the electrodes, you will be seen in an ambulatory surgery center to put in markers into the skull, called fiducials. This allows [place] to plan the surgery and to better localize the area in which we will operate. One week later, stage 1 of the procedure will be done in which the electrodes are implanted. Approximately one week later, stage 2 of the surgery will be done in which the generator (battery) is inserted. Weeks later, programming of the device will take place. Programming is fine-tuned over a series of clinic visits, initially 1-2x per week, and then eventually less frequent. Overall, you can expect at least 8-10 visits prior to seeing benefit from DBS surgery.   Is DBS the right choice for you?  As you can now see, there are many things that carefully need to be considered when making this decision. The ultimate decision is yours to make. ET is not a life threatening disease. It can be very disabling and significantly interfere with one's quality of life. It is our job to provide you with all the pros and cons that can help you make the right choice for you. The main question is whether the tremor is so bothersome to you that it is worth it to take a small but significant risk to treat it. We hope this information will guide you in your decision.   Pre-Operative Visits:  1) During this visit your exam will be videotaped with your consent. We will discuss the details of the surgery and answer any more questions you might have.  2) Neuropsychological Testing. This is standard testing in all potential candidates to help determine those patients that may be at high risk for developing worsening cognition from the procedure. This is a long clinic visit (multiple hours) and can be quite exhausting.  3) Pre-Operative MRI. If you are deemed to be a good  surgical candidate based on the above 2 visits, you will need to have MRI imaging done to be used in the surgical planning process. You will need someone to accompany you to this visit that can drive, as sometimes it is necessary to sedate you for the MRI in order to get adequate pictures.  4) You will also have an appointment with the neurosurgeon who works with [name]. His name is Dr. Vertell Limber.  What to expect regarding the surgery itself:  1. The first step involves placement of the fiducial pins. You are given 5 local injections of anesthetic (numbing medication). Next, 5 pins are screwed into the skull. Following the placement of the pins, you will be transferred down to have a head CT scan. This is used in planning for the surgery. This step is generally done one week prior to scheduled surgery.  2. One week later, you will arrive to the pre-op area OFF of any tremor medications that may  have been prescribed. You will meet nursing and anesthesia staff. A catheter may be placed into the bladder.  3. You will have the sense of "hurry up and wait" multiple times throughout the day, but it is extremely important to remain as patient as possible. It is during these times that we are busy "behind the scenes" doing the surgical planning with all of the imaging scans that you've had done.  4. You are then brought into the OR suite and placed in a "beach chair"-like position. You will not be under general anesthesia. We need you to be awake during certain parts to allow [place] to do important testing. Because you are awake and having brain surgery, it is not surprising that this is very anxiety provoking and scary! We understand this and will help you through it to the best of our abilities. The actual surgical procedure is not painful. It is done with local anesthetic agents. However, the procedure can take up to 6-8 hours, and it is expected that you'll become uncomfortable. We try to minimize any sedating  medications, but will give you pain medicines if needed.  5. You will have a bad haircut, but it will grow back!  6. Following the surgery, you will stay overnight in the hospital for observation.  7. The following day, you will have a very special kind brain MRI scan to allow [place] to evaluate the placement of the electrodes and also to make sure there were no bleeding complications. Provided there are no complications, you will be discharged home the day after surgery.    It is extremely important to remember that after having DBS surgery, you will no longer be able to have a typical MRI scan. This can lead to heating of the electrode wires causing serious burns to the brain and even death.   8. About 1 weeks later you will return for an outpatient surgery that lasts 1-2 hours during which the generator(s) will be placed. You will go home on the same day as the surgery. You will find that you are more uncomfortable after this surgery then your first surgery. You will be given medications to help with this. The pain from this surgery usually resolves in 2-3 days.

## 2013-08-29 NOTE — Telephone Encounter (Signed)
Pt called wanting to let [name] know that she would like to do the stim trial on both sides instead of one.

## 2013-09-12 ENCOUNTER — Encounter: Payer: Self-pay | Admitting: Endocrinology

## 2013-09-12 ENCOUNTER — Telehealth: Payer: Self-pay | Admitting: Neurology

## 2013-09-12 ENCOUNTER — Ambulatory Visit (INDEPENDENT_AMBULATORY_CARE_PROVIDER_SITE_OTHER): Payer: BC Managed Care – PPO | Admitting: Endocrinology

## 2013-09-12 VITALS — BP 118/88 | HR 90 | Temp 98.3°F | Ht 64.0 in | Wt 191.0 lb

## 2013-09-12 DIAGNOSIS — E1165 Type 2 diabetes mellitus with hyperglycemia: Principal | ICD-10-CM

## 2013-09-12 DIAGNOSIS — IMO0001 Reserved for inherently not codable concepts without codable children: Secondary | ICD-10-CM

## 2013-09-12 NOTE — Telephone Encounter (Signed)
Pt stopped by the office this morning wanting to inform you that Dr. Loanne Drilling confirmed with her that she is  Okay to have her deep brain stim.

## 2013-09-12 NOTE — Telephone Encounter (Signed)
FYI

## 2013-09-12 NOTE — Patient Instructions (Addendum)
check your blood sugar 2 times a day.  vary the time of day when you check, between before the 3 meals, and at bedtime.  also check if you have symptoms of your blood sugar being too high or too low.  please keep a record of the readings and bring it to your next appointment here.  please call us sooner if your blood sugar goes below 70, or if you have a lot of readings over 200.  Please feel free to make comments on the record. Please continue NPH insulin, 90 units each morning, and 20 units in the evening.   On this type of insulin schedule, you should eat meals on a regular schedule.  If a meal is missed or significantly delayed, your blood sugar could go low.  Please come back for a follow-up appointment in 1 month.   i am fine with you going ahead with your surgery.

## 2013-09-12 NOTE — Progress Notes (Signed)
Subjective:    Patient ID: [name], female    DOB: [birth year], 63 y.o.   MRN: [id number]  HPI pt returns for f/u of insulin-requiring DM (dx'ed 1994, on a routine blood test; she has mild if any neuropathy of the lower extremities; she is unaware of any associated chronic complications; she has been on insulin since 2012; in November of 2014, she was changed to a simpler qd insulin regimen, due to poor results with multiple daily injections; she was admitted to hospital with DKA in early 2015 after she ran out of insulin; she says this was because her insurance did not pay for it, but this problem has been resolved; she takes human insulin, due to cost; she has never had GDM or pancreatitis; her last episode of severe hypoglycemia was in may of 2015 (twice), due to an aggressive dietary effort).   Pt says she takes 90 units qam, and 20 units qpm.  she brings a record of her cbg's which i have reviewed today.  It varies from 78-311 (both in the afternoon)  Past Medical History  Diagnosis Date  . Hyperlipidemia   . Hypertension   . Diabetes mellitus   . Asthma   . 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 [name], residual palpitations  . Vocal cord dysfunction   . Esophageal reflux   . Tremor, essential     Neurontin 600 mg qhs x 1 yr--no help.  Inderal prior to this was helpful for 10 yrs then stopped working.  . Anxiety   . Allergic rhinitis   . Low sodium syndrome   . Depression     Past Surgical History  Procedure Laterality Date  . Tonsillectomy    . Right breast cyst      benign  . Left ankle ligament repair    . Left elbow repair    . Partial hysterectomy    . Cholecystectomy    . Cesarean section    . Ablation for avnrt    . Colonoscopy  01/16/2004    BPZ:WCHENI rectum/colon  . Esophagogastroduodenoscopy (egd) with esophageal dilation  04/03/2002    DPO:EUMPNTIR'W ring, otherwise normal esophagus, status post dilation with  54 F/Normal stomach  . Bartholin gland cyst excision    . Esophagogastroduodenoscopy (egd) with esophageal dilation N/A 11/08/2012    Procedure: ESOPHAGOGASTRODUODENOSCOPY (EGD) WITH ESOPHAGEAL DILATION;  Surgeon: Daneil Dolin, MD;  Location: [facility];  Service: Endoscopy;  Laterality: N/A;  11:30    History   Social History  . Marital Status: Married    Spouse Name: N/A    Number of Children: N/A  . Years of Education: N/A   Occupational History  . retired    Social History Main Topics  . Smoking status: Former Smoker -- 0.30 packs/day for 10 years    Types: Cigarettes    Quit date: 04/06/1985  . Smokeless tobacco: Never Used  . Alcohol Use: No  . Drug Use: No  . Sexual Activity: No   Other Topics Concern  . Not on file   Social History Narrative   Married, 1 daughte, living.  Lives with husband in [place], [place].   1 child died brain tumor at age 47.   Two grandchildren.   Works at [employer], patient currently lost her job.   Retired 2011.   No tobacco.   Alcohol: none in 30 yrs.  Distant history of heavy use.   No  drug use.    Current Outpatient Prescriptions on File Prior to Visit  Medication Sig Dispense Refill  . albuterol (PROAIR HFA) 108 (90 BASE) MCG/ACT inhaler Inhale 2 puffs into the lungs every 6 (six) hours as needed for wheezing or shortness of breath.  8.5 g  5  . albuterol (PROVENTIL) (2.5 MG/3ML) 0.083% nebulizer solution Take 2.5 mg by nebulization every 6 (six) hours as needed for wheezing or shortness of breath.      Marland Kitchen aspirin EC 81 MG tablet Take 81 mg by mouth every morning.       . cyclobenzaprine (FLEXERIL) 10 MG tablet Take 0.5 tablets (5 mg total) by mouth 3 (three) times daily as needed for muscle spasms.  30 tablet  1  . diphenhydrAMINE (SOMINEX) 25 MG tablet Take 25 mg by mouth as needed for sleep.      . DULoxetine (CYMBALTA) 60 MG capsule Take 1 capsule (60 mg total) by mouth daily.  30 capsule  2  . fluticasone-salmeterol  (ADVAIR HFA) 115-21 MCG/ACT inhaler Inhale 2 puffs into the lungs 2 (two) times daily.      . insulin NPH Human (HUMULIN N,NOVOLIN N) 100 UNIT/ML injection 90 units each morning, and 20 units in the evening      . Insulin Syringe-Needle U-100 (B-D INS SYRINGE 0.5CC/30GX1/2") 30G X 1/2" 0.5 ML MISC Use 1 per day to inject insulin.  100 each  1  . levothyroxine (SYNTHROID, LEVOTHROID) 75 MCG tablet Take 75 mcg by mouth daily before breakfast.      . LORazepam (ATIVAN) 1 MG tablet Take 1 tablet (1 mg total) by mouth 3 (three) times daily as needed.  90 tablet  2  . metoprolol succinate (TOPROL-XL) 50 MG 24 hr tablet Take 1 tablet (50 mg total) by mouth 2 (two) times daily. Take with or immediately following a meal.  60 tablet  11  . mometasone (NASONEX) 50 MCG/ACT nasal spray Place 2 sprays into the nose every morning.       . naproxen (NAPROSYN) 500 MG tablet       . olmesartan (BENICAR) 40 MG tablet Take 1 tablet (40 mg total) by mouth daily.  90 tablet  3  . primidone (MYSOLINE) 50 MG tablet Take 3 tablets (150 mg total) by mouth 2 (two) times daily.  180 tablet  5  . promethazine-codeine (PHENERGAN WITH CODEINE) 6.25-10 MG/5ML syrup Take 5 mLs by mouth 2 (two) times daily as needed for cough.  120 mL  0  . ranitidine (ZANTAC) 150 MG tablet Take 150 mg by mouth daily.      . sertraline (ZOLOFT) 100 MG tablet Take 1.5 tablets (150 mg total) by mouth daily.  45 tablet  2   No current facility-administered medications on file prior to visit.    Allergies  Allergen Reactions  . Metformin And Related     diarrhea  . Guaifenesin     MUCINEX REACTION: asthma attacks  . Morphine And Related Other (See Comments)    Pt. States med makes her crazy  . Oxycodone-Acetaminophen     TYLOX.  . Guaifenesin Er     Family History  Problem Relation Age of Onset  . Lung cancer Father     DIED AGE 74 LUNG CA  . Alcohol abuse Father   . Anxiety disorder Father   . Depression Father   . COPD Mother      DIED AGE 33,MRSA,COPD,PNEUMONIA  . Pneumonia Mother     DIED  AGE 62,MRSA,COPD,PNEUMONIA  . Supraventricular tachycardia Mother   . Anxiety disorder Mother   . Depression Mother   . Alcohol abuse Mother   . COPD Brother     AGE 15  . Heart disease Sister     DIED AGE 1, SMOKER,?HEART  . Colon cancer Neg Hx     BP 118/88  Pulse 90  Temp(Src) 98.3 F (36.8 C) (Oral)  Ht 5\' 4"  (1.626 m)  Wt 191 lb (86.637 kg)  BMI 32.77 kg/m2  SpO2 95%    Review of Systems She has regained a few lbs.  She denies hypoglycemia.      Objective:   Physical Exam VITAL SIGNS:  See vs page GENERAL: no distress PSYCH: Alert and well-oriented.  Does not appear anxious nor depressed.  Lab Results  Component Value Date   HGBA1C 8.6* 08/03/2013      Assessment & Plan:  DM: uncertain control.  For now, i'll have to accept her word that cbg's are well-controlled, and continue the same rx.   Noncompliance with insulin dosing: I'll work around this as best I can. Tremor: despite the above, there is no reason to delay surgery.      Patient is advised the following: Patient Instructions  check your blood sugar 2 times a day.  vary the time of day when you check, between before the 3 meals, and at bedtime.  also check if you have symptoms of your blood sugar being too high or too low.  please keep a record of the readings and bring it to your next appointment here.  please call us sooner if your blood sugar goes below 70, or if you have a lot of readings over 200.  Please feel free to make comments on the record. Please continue NPH insulin, 90 units each morning, and 20 units in the evening.   On this type of insulin schedule, you should eat meals on a regular schedule.  If a meal is missed or significantly delayed, your blood sugar could go low.  Please come back for a follow-up appointment in 1 month.   i am fine with you going ahead with your surgery.

## 2013-09-21 ENCOUNTER — Other Ambulatory Visit (HOSPITAL_COMMUNITY): Payer: Self-pay | Admitting: Neurosurgery

## 2013-09-21 ENCOUNTER — Other Ambulatory Visit: Payer: Self-pay | Admitting: Neurosurgery

## 2013-09-21 DIAGNOSIS — G252 Other specified forms of tremor: Principal | ICD-10-CM

## 2013-09-21 DIAGNOSIS — G250 Essential tremor: Secondary | ICD-10-CM

## 2013-10-10 ENCOUNTER — Ambulatory Visit (INDEPENDENT_AMBULATORY_CARE_PROVIDER_SITE_OTHER): Payer: Medicare Other | Admitting: Internal Medicine

## 2013-10-10 ENCOUNTER — Encounter: Payer: Self-pay | Admitting: Internal Medicine

## 2013-10-10 ENCOUNTER — Encounter

## 2013-10-10 VITALS — BP 128/62 | HR 91 | Ht 64.5 in | Wt 195.2 lb

## 2013-10-10 DIAGNOSIS — J45909 Unspecified asthma, uncomplicated: Secondary | ICD-10-CM

## 2013-10-10 DIAGNOSIS — J309 Allergic rhinitis, unspecified: Secondary | ICD-10-CM

## 2013-10-10 DIAGNOSIS — J3089 Other allergic rhinitis: Secondary | ICD-10-CM

## 2013-10-10 DIAGNOSIS — J302 Other seasonal allergic rhinitis: Secondary | ICD-10-CM

## 2013-10-10 DIAGNOSIS — IMO0001 Reserved for inherently not codable concepts without codable children: Secondary | ICD-10-CM

## 2013-10-10 DIAGNOSIS — K219 Gastro-esophageal reflux disease without esophagitis: Secondary | ICD-10-CM

## 2013-10-10 DIAGNOSIS — J4520 Mild intermittent asthma, uncomplicated: Secondary | ICD-10-CM

## 2013-10-10 MED ORDER — FLUTICASONE-SALMETEROL 115-21 MCG/ACT IN AERO: 2.0000 | INHALATION_SPRAY | Freq: Two times a day (BID) | RESPIRATORY_TRACT | Status: DC

## 2013-10-10 NOTE — Assessment & Plan Note (Signed)
Reminded of reflux precautions 
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
## 2013-10-10 NOTE — Patient Instructions (Signed)
Your breathing is stable. This is a good time to go ahead with planned surgery   Script sent refilling the Advair HFA maintenance inhaler  Please call as needed, and good luck with your surgery!

## 2013-10-10 NOTE — Assessment & Plan Note (Signed)
Generally quite well controlled

## 2013-10-10 NOTE — Assessment & Plan Note (Signed)
She sounds very clear today. She describes a mild sense of congestion and occasional trace yellow phlegm, but she usually has this description. She looks as good as I have seen her over the last year. She is stable at this time for necessary surgery, recognizing potential for bronchospasm or pneumonia, especially if she aspirates. Plan-Advair HFA maintenance inhaler refill. Clear for planned surgery with opportunity to call for pulmonary consultation if necessary

## 2013-10-12 ENCOUNTER — Encounter: Payer: Self-pay | Admitting: Endocrinology

## 2013-10-12 ENCOUNTER — Telehealth: Payer: Self-pay

## 2013-10-12 ENCOUNTER — Ambulatory Visit (INDEPENDENT_AMBULATORY_CARE_PROVIDER_SITE_OTHER): Payer: Medicare Other | Admitting: Endocrinology

## 2013-10-12 VITALS — BP 142/92 | HR 104 | Temp 98.7°F | Ht 64.5 in | Wt 193.0 lb

## 2013-10-12 DIAGNOSIS — E041 Nontoxic single thyroid nodule: Secondary | ICD-10-CM | POA: Diagnosis not present

## 2013-10-12 MED ORDER — INSULIN NPH ISOPHANE & REGULAR (70-30) 100 UNIT/ML ~~LOC~~ SUSP: SUBCUTANEOUS | Status: DC

## 2013-10-12 NOTE — Telephone Encounter (Signed)
ok 

## 2013-10-12 NOTE — Progress Notes (Signed)
Subjective:    Patient ID: [name], female    DOB: Sep 25, 1950, 63 y.o.   MRN: [id number]  HPI pt returns for f/u of insulin-requiring DM (dx'ed 1994, on a routine blood test; she has mild if any neuropathy of the lower extremities; she is unaware of any associated chronic complications; she has been on insulin since 2012; in November of 2014, she was changed to a simpler qd insulin regimen, due to poor results with multiple daily injections; she was admitted to hospital with DKA in early 2015 after she ran out of insulin; she says this was because her insurance did not pay for it, but this problem has been resolved; she takes human insulin, due to cost; she has never had GDM or pancreatitis; her last episode of severe hypoglycemia was in may of 2015 (twice), due to an aggressive dietary effort).  In early 2015, she went on an aggressive diet, and her insulin requirement went down   Pt says she takes 90 units qam, and 20 units qpm.  She will have surgery next month.  she brings a record of her cbg's which i have reviewed today.  It varies from 90-250.  It is lowest in the afternoon, and in the middle of the night. Past Medical History  Diagnosis Date  . Hyperlipidemia   . Hypertension   . Diabetes mellitus   . Asthma   . 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 [name], residual palpitations  . Vocal cord dysfunction   . Esophageal reflux   . Tremor, essential     Neurontin 600 mg qhs x 1 yr--no help.  Inderal prior to this was helpful for 10 yrs then stopped working.  . Anxiety   . Allergic rhinitis   . Low sodium syndrome   . Depression     Past Surgical History  Procedure Laterality Date  . Tonsillectomy    . Right breast cyst      benign  . Left ankle ligament repair    . Left elbow repair    . Partial hysterectomy    . Cholecystectomy    . Cesarean section    . Ablation for avnrt    . Colonoscopy  01/16/2004    [facility]:CWCBJS  rectum/colon  . Esophagogastroduodenoscopy (egd) with esophageal dilation  04/03/2002    [facility]:[id number]'V ring, otherwise normal esophagus, status post dilation with 56 F/Normal stomach  . Bartholin gland cyst excision    . Esophagogastroduodenoscopy (egd) with esophageal dilation N/A 11/08/2012    Procedure: ESOPHAGOGASTRODUODENOSCOPY (EGD) WITH ESOPHAGEAL DILATION;  Surgeon: Daneil Dolin, MD;  Location: [facility];  Service: Endoscopy;  Laterality: N/A;  11:30    History   Social History  . Marital Status: Married    Spouse Name: N/A    Number of Children: N/A  . Years of Education: N/A   Occupational History  . retired    Social History Main Topics  . Smoking status: Former Smoker -- 0.30 packs/day for 10 years    Types: Cigarettes    Quit date: 04/06/1985  . Smokeless tobacco: Never Used  . Alcohol Use: No  . Drug Use: No  . Sexual Activity: No   Other Topics Concern  . Not on file   Social History Narrative   Married, 1 daughte, living.  Lives with husband in [place], [place].   1 child died brain tumor at age 54.   Two grandchildren.   Works  at [employer], patient currently lost her job.   Retired 2011.   No tobacco.   Alcohol: none in 30 yrs.  Distant history of heavy use.   No drug use.    Current Outpatient Prescriptions on File Prior to Visit  Medication Sig Dispense Refill  . albuterol (PROAIR HFA) 108 (90 BASE) MCG/ACT inhaler Inhale 2 puffs into the lungs every 6 (six) hours as needed for wheezing or shortness of breath.  8.5 g  5  . albuterol (PROVENTIL) (2.5 MG/3ML) 0.083% nebulizer solution Take 2.5 mg by nebulization every 6 (six) hours as needed for wheezing or shortness of breath.      Marland Kitchen aspirin EC 81 MG tablet Take 81 mg by mouth every morning.       . cyclobenzaprine (FLEXERIL) 10 MG tablet Take 0.5 tablets (5 mg total) by mouth 3 (three) times daily as needed for muscle spasms.  30 tablet  1  . diphenhydrAMINE (SOMINEX) 25 MG tablet Take 25  mg by mouth as needed for sleep.      . DULoxetine (CYMBALTA) 60 MG capsule Take 1 capsule (60 mg total) by mouth daily.  30 capsule  2  . fluticasone-salmeterol (ADVAIR HFA) 115-21 MCG/ACT inhaler Inhale 2 puffs into the lungs 2 (two) times daily.  1 Inhaler  prn  . Insulin Syringe-Needle U-100 (B-D INS SYRINGE 0.5CC/30GX1/2") 30G X 1/2" 0.5 ML MISC Use 1 per day to inject insulin.  100 each  1  . levothyroxine (SYNTHROID, LEVOTHROID) 75 MCG tablet Take 75 mcg by mouth daily before breakfast.      . LORazepam (ATIVAN) 1 MG tablet Take 1 tablet (1 mg total) by mouth 3 (three) times daily as needed.  90 tablet  2  . metoprolol succinate (TOPROL-XL) 50 MG 24 hr tablet Take 1 tablet (50 mg total) by mouth 2 (two) times daily. Take with or immediately following a meal.  60 tablet  11  . mometasone (NASONEX) 50 MCG/ACT nasal spray Place 2 sprays into the nose every morning.       . naproxen (NAPROSYN) 500 MG tablet       . olmesartan (BENICAR) 40 MG tablet Take 1 tablet (40 mg total) by mouth daily.  90 tablet  3  . primidone (MYSOLINE) 50 MG tablet Take 3 tablets (150 mg total) by mouth 2 (two) times daily.  180 tablet  5  . promethazine-codeine (PHENERGAN WITH CODEINE) 6.25-10 MG/5ML syrup Take 5 mLs by mouth 2 (two) times daily as needed for cough.  120 mL  0  . ranitidine (ZANTAC) 150 MG tablet Take 150 mg by mouth daily.      . sertraline (ZOLOFT) 100 MG tablet Take 1.5 tablets (150 mg total) by mouth daily.  45 tablet  2   No current facility-administered medications on file prior to visit.    Allergies  Allergen Reactions  . Metformin And Related     diarrhea  . Guaifenesin     MUCINEX REACTION: asthma attacks  . Morphine And Related Other (See Comments)    Pt. States med makes her crazy  . Oxycodone-Acetaminophen     TYLOX.  . Guaifenesin Er     Family History  Problem Relation Age of Onset  . Lung cancer Father     DIED AGE 34 LUNG CA  . Alcohol abuse Father   . Anxiety  disorder Father   . Depression Father   . COPD Mother     DIED AGE 99,MRSA,COPD,PNEUMONIA  .  Pneumonia Mother     DIED AGE 19,MRSA,COPD,PNEUMONIA  . Supraventricular tachycardia Mother   . Anxiety disorder Mother   . Depression Mother   . Alcohol abuse Mother   . COPD Brother     AGE 90  . Heart disease Sister     DIED AGE 45, SMOKER,?HEART  . Colon cancer Neg Hx     BP 142/92  Pulse 104  Temp(Src) 98.7 F (37.1 C) (Oral)  Ht 5' 4.5" (1.638 m)  Wt 193 lb (87.544 kg)  BMI 32.63 kg/m2  SpO2 97%    Review of Systems She denies hypoglycemia.  She does not notice any nodule at the anterior neck.    Objective:   Physical Exam VITAL SIGNS:  See vs page GENERAL: no distress Neck: 2 cm right thyroid nodule   Lab Results  Component Value Date   HGBA1C 8.6* 08/03/2013   Lab Results  Component Value Date   TSH 1.618 04/06/2013       Assessment & Plan:  DM: moderate exacerbation: Based on the pattern of her cbg's, she needs 70/30, rather than NPH alone. Thyroid nodule, new   Patient is advised the following: Patient Instructions  check your blood sugar 2 times a day.  vary the time of day when you check, between before the 3 meals, and at bedtime.  also check if you have symptoms of your blood sugar being too high or too low.  please keep a record of the readings and bring it to your next appointment here.  please call us sooner if your blood sugar goes below 70, or if you have a lot of readings over 200.  Please feel free to make comments on the record. Please change NPH insulin to "70/30," 90 units with breakfast, and 20 units with the evening meal.  i have sent a prescription to your pharmacy   On this type of insulin schedule, you should eat meals on a regular schedule.  If a meal is missed or significantly delayed, your blood sugar could go low.  Please come back for a follow-up appointment in 1 month.   i am fine with you going ahead with your surgery.   Let's check  a thyroid ultrasound.  you will receive a phone call, about a day and time for an appointment. Based on the results, you may need a biopsy, which we can do here in the office.  It is easy, and practically painless.

## 2013-10-12 NOTE — Telephone Encounter (Signed)
Received a request from pt's pharmacy asking if we could change Humulin 70/30 to Novolin 70/30 due to insurance. Ok to change? Thanks!

## 2013-10-12 NOTE — Patient Instructions (Addendum)
check your blood sugar 2 times a day.  vary the time of day when you check, between before the 3 meals, and at bedtime.  also check if you have symptoms of your blood sugar being too high or too low.  please keep a record of the readings and bring it to your next appointment here.  please call us sooner if your blood sugar goes below 70, or if you have a lot of readings over 200.  Please feel free to make comments on the record. Please change NPH insulin to "70/30," 90 units with breakfast, and 20 units with the evening meal.  i have sent a prescription to your pharmacy   On this type of insulin schedule, you should eat meals on a regular schedule.  If a meal is missed or significantly delayed, your blood sugar could go low.  Please come back for a follow-up appointment in 1 month.   i am fine with you going ahead with your surgery.   Let's check a thyroid ultrasound.  you will receive a phone call, about a day and time for an appointment. Based on the results, you may need a biopsy, which we can do here in the office.  It is easy, and practically painless.

## 2013-10-13 MED ORDER — INSULIN NPH ISOPHANE & REGULAR (70-30) 100 UNIT/ML ~~LOC~~ SUSP: SUBCUTANEOUS | Status: DC

## 2013-10-13 NOTE — Telephone Encounter (Signed)
Rx sent to pharmacy   

## 2013-10-17 ENCOUNTER — Ambulatory Visit
Admission: RE | Admit: 2013-10-17 | Discharge: 2013-10-17 | Disposition: A | Discharge status: Home / Self Care | Payer: BC Managed Care – PPO | Source: Ambulatory Visit | Attending: Endocrinology | Admitting: Endocrinology

## 2013-10-17 DIAGNOSIS — E041 Nontoxic single thyroid nodule: Secondary | ICD-10-CM

## 2013-10-18 ENCOUNTER — Telehealth: Payer: Self-pay | Admitting: Endocrinology

## 2013-10-18 NOTE — Telephone Encounter (Signed)
Contacted pt and informed of [place] results.

## 2013-10-18 NOTE — Telephone Encounter (Signed)
Requesting lab results pelase call back

## 2013-10-23 ENCOUNTER — Ambulatory Visit: Payer: Self-pay | Admitting: Internal Medicine

## 2013-10-27 ENCOUNTER — Other Ambulatory Visit (HOSPITAL_COMMUNITY)
Admission: RE | Admit: 2013-10-27 | Discharge: 2013-10-27 | Disposition: A | Discharge status: Home / Self Care | Payer: Medicare Other | Source: Ambulatory Visit | Attending: Endocrinology | Admitting: Endocrinology

## 2013-10-27 ENCOUNTER — Encounter: Payer: Self-pay | Admitting: Endocrinology

## 2013-10-27 ENCOUNTER — Ambulatory Visit (INDEPENDENT_AMBULATORY_CARE_PROVIDER_SITE_OTHER): Payer: Medicare Other | Admitting: Endocrinology

## 2013-10-27 VITALS — BP 136/98 | HR 96 | Temp 98.6°F | Ht 64.5 in | Wt 196.0 lb

## 2013-10-27 DIAGNOSIS — E1165 Type 2 diabetes mellitus with hyperglycemia: Secondary | ICD-10-CM

## 2013-10-27 DIAGNOSIS — IMO0001 Reserved for inherently not codable concepts without codable children: Secondary | ICD-10-CM | POA: Diagnosis not present

## 2013-10-27 DIAGNOSIS — E041 Nontoxic single thyroid nodule: Secondary | ICD-10-CM | POA: Insufficient documentation

## 2013-10-27 LAB — MICROALBUMIN / CREATININE URINE RATIO
Creatinine,U: 26.1 mg/dL
Microalb Creat Ratio: 2.7 mg/g (ref 0.0–30.0)
Microalb, Ur: 0.7 mg/dL (ref 0.0–1.9)

## 2013-10-27 LAB — HEMOGLOBIN A1C: Hgb A1c MFr Bld: 8 % — ABNORMAL HIGH (ref 4.6–6.5)

## 2013-10-27 NOTE — Progress Notes (Signed)
Subjective:    Patient ID: [name], female    DOB: 1950/05/29, 63 y.o.   MRN: [id number]  HPI pt returns for f/u of insulin-requiring DM (dx'ed 1994, on a routine blood test; she has mild if any neuropathy of the lower extremities; she is unaware of any associated chronic complications; she has been on insulin since 2012; in November of 2014, she was changed to a simpler qd insulin regimen, due to poor results with multiple daily injections; she was admitted to hospital with DKA in early 2015 after she ran out of insulin; she says this was because her insurance did not pay for it, but this problem has been resolved; she takes human insulin, due to cost; she has never had GDM or pancreatitis; her last episode of severe hypoglycemia was in may of 2015 (twice), due to an aggressive dietary effort).  In early 2015, she went on an aggressive diet, and her insulin requirement went down   Just yesterday, she finished the NPH, and changed to 70/30.  She will have surgery soon for tremor. Past Medical History  Diagnosis Date  . Hyperlipidemia   . Hypertension   . Diabetes mellitus   . Asthma   . 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 [name], residual palpitations  . Vocal cord dysfunction   . Esophageal reflux   . Tremor, essential     Neurontin 600 mg qhs x 1 yr--no help.  Inderal prior to this was helpful for 10 yrs then stopped working.  . Anxiety   . Allergic rhinitis   . Low sodium syndrome   . Depression     Past Surgical History  Procedure Laterality Date  . Tonsillectomy    . Right breast cyst      benign  . Left ankle ligament repair    . Left elbow repair    . Partial hysterectomy    . Cholecystectomy    . Cesarean section    . Ablation for avnrt    . Colonoscopy  01/16/2004    GYK:ZLDJTT rectum/colon  . Esophagogastroduodenoscopy (egd) with esophageal dilation  04/03/2002    SVX:[id number]'P ring, otherwise normal esophagus,  status post dilation with 56 F/Normal stomach  . Bartholin gland cyst excision    . Esophagogastroduodenoscopy (egd) with esophageal dilation N/A 11/08/2012    Procedure: ESOPHAGOGASTRODUODENOSCOPY (EGD) WITH ESOPHAGEAL DILATION;  Surgeon: Daneil Dolin, MD;  Location: [facility];  Service: Endoscopy;  Laterality: N/A;  11:30    History   Social History  . Marital Status: Married    Spouse Name: N/A    Number of Children: N/A  . Years of Education: N/A   Occupational History  . retired    Social History Main Topics  . Smoking status: Former Smoker -- 0.30 packs/day for 10 years    Types: Cigarettes    Quit date: 04/06/1985  . Smokeless tobacco: Never Used  . Alcohol Use: No  . Drug Use: No  . Sexual Activity: No   Other Topics Concern  . Not on file   Social History Narrative   Married, 1 daughte, living.  Lives with husband in [place], [place].   1 child died brain tumor at age 18.   Two grandchildren.   Works at [employer], patient currently lost her job.   Retired 2011.   No tobacco.   Alcohol: none in 30 yrs.  Distant history of heavy use.   No  drug use.    Current Outpatient Prescriptions on File Prior to Visit  Medication Sig Dispense Refill  . albuterol (PROAIR HFA) 108 (90 BASE) MCG/ACT inhaler Inhale 2 puffs into the lungs every 6 (six) hours as needed for wheezing or shortness of breath.  8.5 g  5  . albuterol (PROVENTIL) (2.5 MG/3ML) 0.083% nebulizer solution Take 2.5 mg by nebulization every 6 (six) hours as needed for wheezing or shortness of breath.      Marland Kitchen aspirin EC 81 MG tablet Take 81 mg by mouth every morning.       . cyclobenzaprine (FLEXERIL) 10 MG tablet Take 0.5 tablets (5 mg total) by mouth 3 (three) times daily as needed for muscle spasms.  30 tablet  1  . diphenhydrAMINE (SOMINEX) 25 MG tablet Take 25 mg by mouth as needed for sleep.      . DULoxetine (CYMBALTA) 60 MG capsule Take 1 capsule (60 mg total) by mouth daily.  30 capsule  2  .  fluticasone-salmeterol (ADVAIR HFA) 115-21 MCG/ACT inhaler Inhale 2 puffs into the lungs 2 (two) times daily.  1 Inhaler  prn  . insulin NPH-regular Human (NOVOLIN 70/30) (70-30) 100 UNIT/ML injection 90 units with breakfast and 20 units with evening meal and syringes 2/day.  4 vial  2  . Insulin Syringe-Needle U-100 (B-D INS SYRINGE 0.5CC/30GX1/2") 30G X 1/2" 0.5 ML MISC Use 1 per day to inject insulin.  100 each  1  . levothyroxine (SYNTHROID, LEVOTHROID) 75 MCG tablet Take 75 mcg by mouth daily before breakfast.      . LORazepam (ATIVAN) 1 MG tablet Take 1 tablet (1 mg total) by mouth 3 (three) times daily as needed.  90 tablet  2  . metoprolol succinate (TOPROL-XL) 50 MG 24 hr tablet Take 1 tablet (50 mg total) by mouth 2 (two) times daily. Take with or immediately following a meal.  60 tablet  11  . mometasone (NASONEX) 50 MCG/ACT nasal spray Place 2 sprays into the nose every morning.       . naproxen (NAPROSYN) 500 MG tablet       . olmesartan (BENICAR) 40 MG tablet Take 1 tablet (40 mg total) by mouth daily.  90 tablet  3  . primidone (MYSOLINE) 50 MG tablet Take 3 tablets (150 mg total) by mouth 2 (two) times daily.  180 tablet  5  . promethazine-codeine (PHENERGAN WITH CODEINE) 6.25-10 MG/5ML syrup Take 5 mLs by mouth 2 (two) times daily as needed for cough.  120 mL  0  . ranitidine (ZANTAC) 150 MG tablet Take 150 mg by mouth daily.      . sertraline (ZOLOFT) 100 MG tablet Take 1.5 tablets (150 mg total) by mouth daily.  45 tablet  2   No current facility-administered medications on file prior to visit.    Allergies  Allergen Reactions  . Metformin And Related     diarrhea  . Guaifenesin     MUCINEX REACTION: asthma attacks  . Morphine And Related Other (See Comments)    Pt. States med makes her crazy  . Oxycodone-Acetaminophen     TYLOX.  . Guaifenesin Er     Family History  Problem Relation Age of Onset  . Lung cancer Father     DIED AGE 40 LUNG CA  . Alcohol abuse  Father   . Anxiety disorder Father   . Depression Father   . COPD Mother     DIED AGE 50,MRSA,COPD,PNEUMONIA  . Pneumonia Mother  DIED AGE 47,MRSA,COPD,PNEUMONIA  . Supraventricular tachycardia Mother   . Anxiety disorder Mother   . Depression Mother   . Alcohol abuse Mother   . COPD Brother     AGE 71  . Heart disease Sister     DIED AGE 2, SMOKER,?HEART  . Colon cancer Neg Hx     BP 136/98  Pulse 96  Temp(Src) 98.6 F (37 C) (Oral)  Ht 5' 4.5" (1.638 m)  Wt 196 lb (88.905 kg)  BMI 33.14 kg/m2  SpO2 96%   Review of Systems She denies hypoglycemia and weight change.      Objective:   Physical Exam Pulses: dorsalis pedis intact bilat.   Feet: no deformity. normal color and temp.  no edema Skin:  no ulcer on the feet.   Neuro: sensation is intact to touch on the feet   Lab Results  Component Value Date   HGBA1C 8.0* 10/27/2013       Assessment & Plan:  DM: mild exacerbation.  She will change to 70/30.    Procedure: thyroid needle bx: consent obtained, signed form on chart The area is first sprayed with cooling agent local: xylocaine 2%, with epinephrine prep: alcohol pad 4 bxs are done with 25 and 01X needles no complications   Patient is advised the following: Patient Instructions  check your blood sugar 2 times a day.  vary the time of day when you check, between before the 3 meals, and at bedtime.  also check if you have symptoms of your blood sugar being too high or too low.  please keep a record of the readings and bring it to your next appointment here.  please call us sooner if your blood sugar goes below 70, or if you have a lot of readings over 200.  Please feel free to make comments on the record. Please continue "70/30" insulin: 90 units with breakfast, and 20 units with the evening meal.   On this type of insulin schedule, you should eat meals on a regular schedule.  If a meal is missed or significantly delayed, your blood sugar could go  low.  Please come back for a follow-up appointment in 3 months.   i am fine with you going ahead with your surgery.   blood tests are being requested for you today.  We'll contact you with results of this and your biopsy.

## 2013-10-27 NOTE — Patient Instructions (Addendum)
check your blood sugar 2 times a day.  vary the time of day when you check, between before the 3 meals, and at bedtime.  also check if you have symptoms of your blood sugar being too high or too low.  please keep a record of the readings and bring it to your next appointment here.  please call us sooner if your blood sugar goes below 70, or if you have a lot of readings over 200.  Please feel free to make comments on the record. Please continue "70/30" insulin: 90 units with breakfast, and 20 units with the evening meal.   On this type of insulin schedule, you should eat meals on a regular schedule.  If a meal is missed or significantly delayed, your blood sugar could go low.  Please come back for a follow-up appointment in 3 months.   i am fine with you going ahead with your surgery.   blood tests are being requested for you today.  We'll contact you with results of this and your biopsy.

## 2013-10-30 ENCOUNTER — Encounter: Payer: Self-pay | Admitting: Internal Medicine

## 2013-10-30 ENCOUNTER — Ambulatory Visit (INDEPENDENT_AMBULATORY_CARE_PROVIDER_SITE_OTHER): Payer: Medicare Other | Admitting: Internal Medicine

## 2013-10-30 ENCOUNTER — Telehealth: Payer: Self-pay | Admitting: Endocrinology

## 2013-10-30 ENCOUNTER — Ambulatory Visit (INDEPENDENT_AMBULATORY_CARE_PROVIDER_SITE_OTHER)
Admission: RE | Admit: 2013-10-30 | Discharge: 2013-10-30 | Disposition: A | Discharge status: Home / Self Care | Payer: Medicare Other | Source: Ambulatory Visit | Attending: Internal Medicine | Admitting: Internal Medicine

## 2013-10-30 ENCOUNTER — Ambulatory Visit (INDEPENDENT_AMBULATORY_CARE_PROVIDER_SITE_OTHER): Payer: Medicare Other | Admitting: Neurology

## 2013-10-30 ENCOUNTER — Encounter (HOSPITAL_COMMUNITY): Payer: Self-pay | Admitting: Psychiatry

## 2013-10-30 ENCOUNTER — Encounter: Payer: Self-pay | Admitting: Neurology

## 2013-10-30 VITALS — BP 112/76 | HR 94 | Temp 98.5°F | Resp 16 | Ht 65.4 in | Wt 195.0 lb

## 2013-10-30 VITALS — BP 132/80 | HR 88 | Resp 18 | Ht 64.5 in | Wt 195.0 lb

## 2013-10-30 DIAGNOSIS — E785 Hyperlipidemia, unspecified: Secondary | ICD-10-CM

## 2013-10-30 DIAGNOSIS — K219 Gastro-esophageal reflux disease without esophagitis: Secondary | ICD-10-CM

## 2013-10-30 DIAGNOSIS — I10 Essential (primary) hypertension: Secondary | ICD-10-CM | POA: Diagnosis not present

## 2013-10-30 DIAGNOSIS — M545 Low back pain, unspecified: Secondary | ICD-10-CM

## 2013-10-30 DIAGNOSIS — IMO0001 Reserved for inherently not codable concepts without codable children: Secondary | ICD-10-CM

## 2013-10-30 DIAGNOSIS — G252 Other specified forms of tremor: Principal | ICD-10-CM

## 2013-10-30 DIAGNOSIS — M48061 Spinal stenosis, lumbar region without neurogenic claudication: Secondary | ICD-10-CM | POA: Insufficient documentation

## 2013-10-30 DIAGNOSIS — E1165 Type 2 diabetes mellitus with hyperglycemia: Secondary | ICD-10-CM

## 2013-10-30 DIAGNOSIS — G250 Essential tremor: Secondary | ICD-10-CM | POA: Diagnosis not present

## 2013-10-30 DIAGNOSIS — E038 Other specified hypothyroidism: Secondary | ICD-10-CM | POA: Diagnosis not present

## 2013-10-30 DIAGNOSIS — M47817 Spondylosis without myelopathy or radiculopathy, lumbosacral region: Secondary | ICD-10-CM | POA: Diagnosis not present

## 2013-10-30 MED ORDER — HYDROCODONE-ACETAMINOPHEN 5-325 MG PO TABS: 1.0000 | ORAL_TABLET | Freq: Four times a day (QID) | ORAL | Status: DC | PRN

## 2013-10-30 MED ORDER — LOSARTAN POTASSIUM 100 MG PO TABS: 100.0000 mg | ORAL_TABLET | Freq: Every day | ORAL | Status: DC

## 2013-10-30 NOTE — Assessment & Plan Note (Signed)
She has left radicular s/s but her exam is normal Will check a plain film and will consider getting an MRI done For now will control the pain with norco in addition to flexeril and naproxen

## 2013-10-30 NOTE — Progress Notes (Signed)
Subjective:    [name] was seen in consultation in the movement disorder clinic at the request of Scarlette Calico, MD.  The evaluation is for tremor.  The patient is a 63 y.o. right handed female with a history of tremor.  She has had tremor since high school (or even before) but it has gotten worse with time.   She states that she was at [facility] as a child and was dx with ET.  She doesn't remember if she was started on medication as a child.  She last saw a neurologist in [place] in 2009 (not a [facility]).  She has been on primidone 150 mg bid for over 10 years but not as long as 20 years.  She is also on toprol but that is for SVT.  She doesn't remember being on other tremor medications.  Both hands shake equally.  She has trouble writing.  She has noted head and vocal tremor for a few years.  There is family hx of tremor in her mother and maybe in father.  No tremor in her children.    Affected by caffeine:  yes (drinks at least 3 cups of coffee in the AM) Affected by alcohol:  yes (decreases it) Affected by stress:  yes Affected by fatigue:  yes Spills soup if on spoon:  yes Spills glass of liquid if full:  yes (uses lids) Affects ADL's (tying shoes, brushing teeth, etc):  yes, some trouble tying shoes  The patient does mention that on 04/05/2013 she may have had a seizure.  I reviewed medical records related to this.  She presented to the emergency room with DKA and a blood pressure of 200/131.  According to records, the patient was not found unresponsive, but the patient states that her husband told her that she was unresponsive.  Nonetheless, she states that she had also ran out of all of her medications, including her blood pressure medication and her primidone, for several days prior to the event.  She had an EEG that was mildly slow, but no evidence of epileptiform activity.  She has had no further events.  08/29/13 update:  The patient returns today for followup.  Since our last visit, the  patient did have neuropsychometric testing with Dr. Conley Canal.  I did review this.  There was no evidence of cognitive dysfunction that was suggestive of a dementia.  Her anxiety was well treated.  I reviewed her notes with Dr. Loanne Drilling as well.  She did have a history of DKA in early 2015 because she ran out of her insulin.  Her A1c's have been better controlled.  Her A1c earlier in the year was 10.3 and her latest A1c was 8.6.  She does express that she has been trying to get it under better control because she would like to proceed with DBS surgery.  She does state that Saturday morning her blood sugar dropped to 24, but she was still fully awake and aware.  Her pulse rate was very high today, but she states she has run out of her Toprol.  She did see Dr. Loanne Drilling just prior to our visit today and he changed her diabetic medications as her levemir was very expensive.  10/30/13 update: The patient was seen today in followup, accompanied by her husband who supplements the history.  The patient has seen Dr. Vertell Limber.  She is scheduled for surgery on August 25.  She did have a thyroid nodule biopsied today.  The ultrasound demonstrated an inhomogenous nodule,  but Dr. Loanne Drilling felt that she was cleared for surgery from his standpoint, as did Dr. Annamaria Boots.  The patient would like a bilateral procedure.  She states that she just does too much with both of her hands to just do her dominant hand.  Current/Previously tried tremor medications: ativan (takes very rarely for anxiety), primidone, toprol for svt  Current medications that may exacerbate tremor: albuterol nebulizer (uses it 1-2 times per month and it increases tremor)  Outside reports reviewed: historical medical records and referral letter/letters.  Allergies  Allergen Reactions  . Metformin And Related     diarrhea  . Guaifenesin     MUCINEX REACTION: asthma attacks  . Morphine And Related Other (See Comments)    Pt. States med makes her crazy  .  Oxycodone-Acetaminophen     TYLOX.  . Guaifenesin Er     Current Outpatient Prescriptions on File Prior to Visit  Medication Sig Dispense Refill  . albuterol (PROAIR HFA) 108 (90 BASE) MCG/ACT inhaler Inhale 2 puffs into the lungs every 6 (six) hours as needed for wheezing or shortness of breath.  8.5 g  5  . albuterol (PROVENTIL) (2.5 MG/3ML) 0.083% nebulizer solution Take 2.5 mg by nebulization every 6 (six) hours as needed for wheezing or shortness of breath.      Marland Kitchen aspirin EC 81 MG tablet Take 81 mg by mouth every morning.       . cyclobenzaprine (FLEXERIL) 10 MG tablet Take 0.5 tablets (5 mg total) by mouth 3 (three) times daily as needed for muscle spasms.  30 tablet  1  . diphenhydrAMINE (SOMINEX) 25 MG tablet Take 25 mg by mouth as needed for sleep.      . DULoxetine (CYMBALTA) 60 MG capsule Take 1 capsule (60 mg total) by mouth daily.  30 capsule  2  . fluticasone-salmeterol (ADVAIR HFA) 115-21 MCG/ACT inhaler Inhale 2 puffs into the lungs 2 (two) times daily.      . Insulin Syringe-Needle U-100 (B-D INS SYRINGE 0.5CC/30GX1/2") 30G X 1/2" 0.5 ML MISC Use 1 per day to inject insulin.  100 each  1  . levothyroxine (SYNTHROID, LEVOTHROID) 75 MCG tablet Take 75 mcg by mouth daily before breakfast.      . LORazepam (ATIVAN) 1 MG tablet Take 1 tablet (1 mg total) by mouth 3 (three) times daily as needed.  90 tablet  2  . metoprolol succinate (TOPROL-XL) 50 MG 24 hr tablet Take 1 tablet (50 mg total) by mouth 2 (two) times daily. Take with or immediately following a meal.  60 tablet  11  . mometasone (NASONEX) 50 MCG/ACT nasal spray Place 2 sprays into the nose every morning.       . naproxen (NAPROSYN) 500 MG tablet       . olmesartan (BENICAR) 40 MG tablet Take 1 tablet (40 mg total) by mouth daily.  90 tablet  3  . primidone (MYSOLINE) 50 MG tablet Take 3 tablets (150 mg total) by mouth 2 (two) times daily.  180 tablet  5  . promethazine-codeine (PHENERGAN WITH CODEINE) 6.25-10 MG/5ML  syrup Take 5 mLs by mouth 2 (two) times daily as needed for cough.  120 mL  0  . sertraline (ZOLOFT) 100 MG tablet Take 1.5 tablets (150 mg total) by mouth daily.  45 tablet  2   No current facility-administered medications on file prior to visit.    Past Medical History  Diagnosis Date  . Hyperlipidemia   . Hypertension   .  Diabetes mellitus   . Asthma   . 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 [name], residual palpitations  . Vocal cord dysfunction   . Esophageal reflux   . Tremor, essential     Neurontin 600 mg qhs x 1 yr--no help.  Inderal prior to this was helpful for 10 yrs then stopped working.  . Anxiety   . Allergic rhinitis   . Low sodium syndrome   . Depression     Past Surgical History  Procedure Laterality Date  . Tonsillectomy    . Right breast cyst      benign  . Left ankle ligament repair    . Left elbow repair    . Partial hysterectomy    . Cholecystectomy    . Cesarean section    . Ablation for avnrt    . Colonoscopy  01/16/2004    YQM:VHQION rectum/colon  . Esophagogastroduodenoscopy (egd) with esophageal dilation  04/03/2002    GEX:[id number]'M ring, otherwise normal esophagus, status post dilation with 56 F/Normal stomach  . Bartholin gland cyst excision    . Esophagogastroduodenoscopy (egd) with esophageal dilation N/A 11/08/2012    Procedure: ESOPHAGOGASTRODUODENOSCOPY (EGD) WITH ESOPHAGEAL DILATION;  Surgeon: Daneil Dolin, MD;  Location: [facility];  Service: Endoscopy;  Laterality: N/A;  11:30    History   Social History  . Marital Status: Married    Spouse Name: N/A    Number of Children: N/A  . Years of Education: N/A   Occupational History  . retired    Social History Main Topics  . Smoking status: Former Smoker -- 0.30 packs/day for 10 years    Types: Cigarettes    Quit date: 04/06/1985  . Smokeless tobacco: Never Used  . Alcohol Use: No  . Drug Use: No  . Sexual Activity: No    Other Topics Concern  . Not on file   Social History Narrative   Married, 1 daughte, living.  Lives with husband in [place], [place].   1 child died brain tumor at age 53.   Two grandchildren.   Works at [employer], patient currently lost her job.   Retired 2011.   No tobacco.   Alcohol: none in 30 yrs.  Distant history of heavy use.   No drug use.    Family Status  Relation Status Death Age  . Father Deceased     lung cancer, diabetes  . Mother Deceased     COPD, diabetes  . Son Deceased 7    brain tumor (neuroblastoma)  . Sister Deceased     blood clot  . Daughter Alive     healthy    Review of Systems A complete 10 system ROS was obtained and was negative apart from what is mentioned.  Patient was videotaped with her consent today.   Objective:   VITALS:   Filed Vitals:   08/29/13 0915  BP: 150/100  Pulse: 130  Height: 5' 4.5" (1.638 m)  Weight: 190 lb (86.183 kg)   Gen:  Appears stated age and in NAD. HEENT:  Normocephalic, atraumatic. The mucous membranes are moist. The superficial temporal arteries are without ropiness or tenderness. Cardiovascular: Regular rate and rhythm. Lungs: Clear to auscultation bilaterally. Neck: There are no carotid bruits noted bilaterally.  NEUROLOGICAL:  Orientation:  The patient is alert and oriented x 3.  Recent and remote memory are intact.  Attention span and concentration are normal.  Able to name  objects and repeat without trouble.  Fund of knowledge is appropriate Cranial nerves: There is good facial symmetry. The pupils are equal round and reactive to light bilaterally. Fundoscopic exam is attempted but the disc margins are not well visualized bilaterally. Extraocular muscles are intact and visual fields are full to confrontational testing. Speech is fluent and clear. There is a vocal tremor and just slight pseudobulbar quality to the voice.  Soft palate rises symmetrically and there is no tongue deviation.  Hearing is intact to conversational tone. Tone: Tone is good throughout. Sensation: Sensation is intact to light touch touch throughout. Coordination:  The patient has no dysdiadichokinesia or dysmetria. Motor: Strength is 5/5 in the bilateral upper and lower extremities.  Shoulder shrug is equal bilaterally.  There is no pronator drift.  There are no fasciculations noted. Gait and Station: The patient is able to ambulate without difficulty.   MOVEMENT EXAM: Tremor:  There is tremor in the UE, noted most significantly with action.  The patient is not able to draw Archimedes spirals without significant difficulty.  There is head tremor at rest that is complex head titubation.    LABS:  Lab Results  Component Value Date   HGBA1C 8.6* 08/03/2013     Lab Results  Component Value Date   TSH 1.618 04/06/2013   Lab Results  Component Value Date   VITAMINB12 416 04/06/2013      Assessment/Plan:   1.  Essential Tremor.  -This is evidenced by the symmetrical nature and longstanding hx of gradually getting worse.  It is quite severe.  She and I talked about surgical details today. We talked about risks of surgical infections.  We talked about risks and benefits of surgery.  We talked about the risks and benefits of unilateral versus bilateral.  We talked about risks of worsening balance with a bilateral procedure.  She understands, but has decided on a bilateral procedure nonetheless.  She asked multiple questions and answering them to the best of my ability.  She was given a weaning schedule for all of her medications prior to surgery, but particularly the primidone which will need to be weaned slowly.  -Preoperative video was done today.  -Greater than 50% of the 40 minute visit in counseling. 2.  Encephalopathy in 03/2013  -IF a seizure was involved, it was likely due to a combination of stopping the primidone "cold [place]", a BP of 200/131 (? PRES), and DKA.  She has been event free since then  and no AED are recommended.

## 2013-10-30 NOTE — Progress Notes (Signed)
Pre visit review using our clinic review tool, if applicable. No additional management support is needed unless otherwise documented below in the visit note. 

## 2013-10-30 NOTE — Telephone Encounter (Signed)
Patient would like Megan to call her with her test results    Thank You

## 2013-10-30 NOTE — Patient Instructions (Signed)
Back Pain, Adult Low back pain is very common. About 1 in 5 people have back pain.The cause of low back pain is rarely dangerous. The pain often gets better over time.About half of people with a sudden onset of back pain feel better in just 2 weeks. About 8 in 10 people feel better by 6 weeks.  CAUSES Some common causes of back pain include:  Strain of the muscles or ligaments supporting the spine.  Wear and tear (degeneration) of the spinal discs.  Arthritis.  Direct injury to the back. DIAGNOSIS Most of the time, the direct cause of low back pain is not known.However, back pain can be treated effectively even when the exact cause of the pain is unknown.Answering your caregiver's questions about your overall health and symptoms is one of the most accurate ways to make sure the cause of your pain is not dangerous. If your caregiver needs more information, he or she may order lab work or imaging tests (X-rays or MRIs).However, even if imaging tests show changes in your back, this usually does not require surgery. HOME CARE INSTRUCTIONS For many people, back pain returns.Since low back pain is rarely dangerous, it is often a condition that people can learn to manageon their own.   Remain active. It is stressful on the back to sit or stand in one place. Do not sit, drive, or stand in one place for more than 30 minutes at a time. Take short walks on level surfaces as soon as pain allows.Try to increase the length of time you walk each day.  Do not stay in bed.Resting more than 1 or 2 days can delay your recovery.  Do not avoid exercise or work.Your body is made to move.It is not dangerous to be active, even though your back may hurt.Your back will likely heal faster if you return to being active before your pain is gone.  Pay attention to your body when you bend and lift. Many people have less discomfortwhen lifting if they bend their knees, keep the load close to their bodies,and  avoid twisting. Often, the most comfortable positions are those that put less stress on your recovering back.  Find a comfortable position to sleep. Use a firm mattress and lie on your side with your knees slightly bent. If you lie on your back, put a pillow under your knees.  Only take over-the-counter or prescription medicines as directed by your caregiver. Over-the-counter medicines to reduce pain and inflammation are often the most helpful.Your caregiver may prescribe muscle relaxant drugs.These medicines help dull your pain so you can more quickly return to your normal activities and healthy exercise.  Put ice on the injured area.  Put ice in a plastic bag.  Place a towel between your skin and the bag.  Leave the ice on for 15-20 minutes, 03-04 times a day for the first 2 to 3 days. After that, ice and heat may be alternated to reduce pain and spasms.  Ask your caregiver about trying back exercises and gentle massage. This may be of some benefit.  Avoid feeling anxious or stressed.Stress increases muscle tension and can worsen back pain.It is important to recognize when you are anxious or stressed and learn ways to manage it.Exercise is a great option. SEEK MEDICAL CARE IF:  You have pain that is not relieved with rest or medicine.  You have pain that does not improve in 1 week.  You have new symptoms.  You are generally not feeling well. SEEK   IMMEDIATE MEDICAL CARE IF:   You have pain that radiates from your back into your legs.  You develop new bowel or bladder control problems.  You have unusual weakness or numbness in your arms or legs.  You develop nausea or vomiting.  You develop abdominal pain.  You feel faint. Document Released: 03/23/2005 Document Revised: 09/22/2011 Document Reviewed: 07/25/2013 ExitCare Patient Information 2015 [employer]. This information is not intended to replace advice given to you by your health care provider. Make sure you  discuss any questions you have with your health care provider.  

## 2013-10-30 NOTE — Patient Instructions (Addendum)
1. Decrease Primidone 50 mg as follows:  Starting 11/16/13 - Take 3 tablets in the AM, 2 tablets in the PM 11/18/13 - Take 2 tablets in the AM, 2 tablets in the PM 11/20/13 - Take 2 tablets in the AM, 1 tablet in the PM 11/22/13 - Take 1 tablet in the AM, 1 tablet in the PM 11/24/13 - Take 1 tablet in the AM 11/26/13- DO NOT TAKE ANYMORE PRIMIDONE  2. Do not take ANY Ativan starting 11/27/2013 until after surgery  3. Do not take ANY Toprol on 11/28/2013 until after surgery

## 2013-10-30 NOTE — Telephone Encounter (Signed)
Unable to reach pt will try again at a later time.  

## 2013-10-30 NOTE — Assessment & Plan Note (Signed)
Her BP is well controlled Will change benicar to losartan due to the cost

## 2013-10-30 NOTE — Progress Notes (Signed)
Subjective:    Patient ID: [name], female    DOB: November 03, 1950, 63 y.o.   MRN: [id number]  Back Pain This is a new problem. The current episode started 1 to 4 weeks ago. The problem occurs intermittently. The problem is unchanged. The pain is present in the lumbar spine. The quality of the pain is described as aching, shooting and stabbing. The pain radiates to the left thigh. The pain is at a severity of 4/10. The pain is moderate. The pain is worse during the day. The symptoms are aggravated by bending and position. Associated symptoms include leg pain (left). Pertinent negatives include no abdominal pain, bladder incontinence, bowel incontinence, chest pain, dysuria, fever, headaches, numbness, paresis, paresthesias, pelvic pain, perianal numbness, tingling, weakness or weight loss. Risk factors include lack of exercise and obesity. She has tried NSAIDs and muscle relaxant for the symptoms. The treatment provided mild relief.      Review of Systems  Constitutional: Negative.  Negative for fever, chills, weight loss, appetite change and fatigue.  HENT: Negative.   Eyes: Negative.   Respiratory: Negative.  Negative for cough, choking, chest tightness, shortness of breath and stridor.   Cardiovascular: Negative.  Negative for chest pain, palpitations and leg swelling.  Gastrointestinal: Negative.  Negative for nausea, abdominal pain, diarrhea, constipation and bowel incontinence.  Endocrine: Negative.  Negative for polydipsia, polyphagia and polyuria.  Genitourinary: Negative.  Negative for bladder incontinence, dysuria and pelvic pain.  Musculoskeletal: Positive for back pain. Negative for arthralgias, gait problem, joint swelling, myalgias, neck pain and neck stiffness.  Skin: Negative.   Allergic/Immunologic: Negative.   Neurological: Positive for tremors. Negative for tingling, weakness, numbness, headaches and paresthesias.  Hematological: Negative.  Negative for adenopathy. Does  not bruise/bleed easily.  Psychiatric/Behavioral: Negative.        Objective:   Physical Exam  Vitals reviewed. Constitutional: She is oriented to person, place, and time. She appears well-developed and well-nourished. No distress.  HENT:  Head: Normocephalic and atraumatic.  Mouth/Throat: Oropharynx is clear and moist. No oropharyngeal exudate.  Eyes: Conjunctivae are normal. Right eye exhibits no discharge. Left eye exhibits no discharge. No scleral icterus.  Neck: Normal range of motion. Neck supple. No JVD present. No tracheal deviation present. No thyromegaly present.  Cardiovascular: Normal rate, regular rhythm, normal heart sounds and intact distal pulses.  Exam reveals no gallop and no friction rub.   No murmur heard. Pulmonary/Chest: Effort normal and breath sounds normal. No stridor. No respiratory distress. She has no wheezes. She has no rales. She exhibits no tenderness.  Abdominal: Soft. Bowel sounds are normal. She exhibits no distension and no mass. There is no tenderness. There is no rebound and no guarding.  Musculoskeletal: Normal range of motion. She exhibits no edema and no tenderness.       Lumbar back: Normal. She exhibits normal range of motion, no tenderness, no bony tenderness, no swelling, no edema, no deformity, no laceration, no pain, no spasm and normal pulse.  Lymphadenopathy:    She has no cervical adenopathy.  Neurological: She is alert and oriented to person, place, and time. She has normal strength. She displays no atrophy, no tremor and normal reflexes. No cranial nerve deficit or sensory deficit. She exhibits normal muscle tone. She displays a negative Romberg sign. She displays no seizure activity. Coordination and gait normal.  Reflex Scores:      Tricep reflexes are 1+ on the right side and 1+ on the left side.  Bicep reflexes are 1+ on the right side and 1+ on the left side.      Brachioradialis reflexes are 1+ on the right side and 1+ on the left  side.      Patellar reflexes are 1+ on the right side and 1+ on the left side.      Achilles reflexes are 1+ on the right side and 1+ on the left side. Skin: Skin is warm and dry. No rash noted. She is not diaphoretic. No erythema. No pallor.  Psychiatric: She has a normal mood and affect. Her behavior is normal. Judgment and thought content normal.      Lab Results  Component Value Date   WBC 9.6 04/11/2013   HGB 13.0 04/11/2013   HCT 37.2 04/11/2013   PLT 185 04/11/2013   GLUCOSE 284* 06/14/2013   CHOL 187 01/18/2013   TRIG 96.0 01/18/2013   HDL 79.70 01/18/2013   LDLCALC 88 01/18/2013   ALT 11 04/11/2013   AST 14 04/11/2013   NA 133* 06/14/2013   K 4.0 06/14/2013   CL 98 06/14/2013   CREATININE 1.00 08/23/2013   BUN 16 06/14/2013   CO2 26 06/14/2013   TSH 1.618 04/06/2013   INR 1.0 02/27/2008   HGBA1C 8.0* 10/27/2013   MICROALBUR 0.7 10/27/2013      Assessment & Plan:

## 2013-10-30 NOTE — Assessment & Plan Note (Signed)
Her TSH has been in the normal range

## 2013-10-30 NOTE — Telephone Encounter (Signed)
Left voice mail advised of lab results.

## 2013-10-31 ENCOUNTER — Encounter: Payer: Self-pay | Admitting: Internal Medicine

## 2013-11-01 ENCOUNTER — Telehealth: Payer: Self-pay | Admitting: *Deleted

## 2013-11-01 NOTE — Telephone Encounter (Signed)
Left msg on triage requesting results back on the spine xray. Called pt back inform her md has mail out xray results on yesterday, but did give md response on the letter...[name]

## 2013-11-02 ENCOUNTER — Encounter (HOSPITAL_COMMUNITY): Payer: Self-pay | Admitting: Emergency Medicine

## 2013-11-02 ENCOUNTER — Emergency Department (HOSPITAL_COMMUNITY)
Admission: EM | Admit: 2013-11-02 | Discharge: 2013-11-02 | Disposition: A | Discharge status: Home / Self Care | Payer: Medicare Other | Attending: Emergency Medicine | Admitting: Emergency Medicine

## 2013-11-02 DIAGNOSIS — Z794 Long term (current) use of insulin: Secondary | ICD-10-CM | POA: Diagnosis not present

## 2013-11-02 DIAGNOSIS — Z79899 Other long term (current) drug therapy: Secondary | ICD-10-CM | POA: Diagnosis not present

## 2013-11-02 DIAGNOSIS — Z7982 Long term (current) use of aspirin: Secondary | ICD-10-CM | POA: Insufficient documentation

## 2013-11-02 DIAGNOSIS — G8929 Other chronic pain: Secondary | ICD-10-CM | POA: Diagnosis not present

## 2013-11-02 DIAGNOSIS — Z8739 Personal history of other diseases of the musculoskeletal system and connective tissue: Secondary | ICD-10-CM | POA: Insufficient documentation

## 2013-11-02 DIAGNOSIS — M549 Dorsalgia, unspecified: Secondary | ICD-10-CM | POA: Insufficient documentation

## 2013-11-02 DIAGNOSIS — F3289 Other specified depressive episodes: Secondary | ICD-10-CM | POA: Insufficient documentation

## 2013-11-02 DIAGNOSIS — F329 Major depressive disorder, single episode, unspecified: Secondary | ICD-10-CM | POA: Diagnosis not present

## 2013-11-02 DIAGNOSIS — E119 Type 2 diabetes mellitus without complications: Secondary | ICD-10-CM | POA: Insufficient documentation

## 2013-11-02 DIAGNOSIS — K219 Gastro-esophageal reflux disease without esophagitis: Secondary | ICD-10-CM | POA: Insufficient documentation

## 2013-11-02 DIAGNOSIS — F411 Generalized anxiety disorder: Secondary | ICD-10-CM | POA: Insufficient documentation

## 2013-11-02 DIAGNOSIS — I10 Essential (primary) hypertension: Secondary | ICD-10-CM | POA: Insufficient documentation

## 2013-11-02 DIAGNOSIS — Z8669 Personal history of other diseases of the nervous system and sense organs: Secondary | ICD-10-CM | POA: Insufficient documentation

## 2013-11-02 DIAGNOSIS — Z87891 Personal history of nicotine dependence: Secondary | ICD-10-CM | POA: Insufficient documentation

## 2013-11-02 DIAGNOSIS — J45909 Unspecified asthma, uncomplicated: Secondary | ICD-10-CM | POA: Insufficient documentation

## 2013-11-02 DIAGNOSIS — Z791 Long term (current) use of non-steroidal anti-inflammatories (NSAID): Secondary | ICD-10-CM | POA: Insufficient documentation

## 2013-11-02 DIAGNOSIS — M5430 Sciatica, unspecified side: Secondary | ICD-10-CM | POA: Insufficient documentation

## 2013-11-02 DIAGNOSIS — M545 Low back pain: Secondary | ICD-10-CM

## 2013-11-02 HISTORY — DX: Other intervertebral disc degeneration, lumbar region without mention of lumbar back pain or lower extremity pain: M51.369

## 2013-11-02 HISTORY — DX: Dorsalgia, unspecified: M54.9

## 2013-11-02 HISTORY — DX: Radiculopathy, lumbar region: M54.16

## 2013-11-02 HISTORY — DX: Other intervertebral disc degeneration, lumbar region: M51.36

## 2013-11-02 HISTORY — DX: Other chronic pain: G89.29

## 2013-11-02 MED ORDER — CYCLOBENZAPRINE HCL 10 MG PO TABS
10.0000 mg | ORAL_TABLET | Freq: Once | ORAL | Status: AC
  Administered 2013-11-02: 10 mg via ORAL
  Filled 2013-11-02: qty 1

## 2013-11-02 MED ORDER — HYDROCODONE-ACETAMINOPHEN 5-325 MG PO TABS
1.0000 | ORAL_TABLET | Freq: Once | ORAL | Status: AC
  Administered 2013-11-02: 1 via ORAL
  Filled 2013-11-02: qty 1

## 2013-11-02 NOTE — ED Notes (Signed)
Having back and leg pain.  Have a history of back pain.

## 2013-11-02 NOTE — Discharge Instructions (Signed)
°Emergency Department Resource Guide °1) Find a Doctor and Pay Out of Pocket °Although you won't have to find out who is covered by your insurance plan, it is a good idea to ask around and get recommendations. You will then need to call the office and see if the doctor you have chosen will accept you as a new patient and what types of options they offer for patients who are self-pay. Some doctors offer discounts or will set up payment plans for their patients who do not have insurance, but you will need to ask so you aren't surprised when you get to your appointment. ° °2) Contact Your Local Health Department °Not all health departments have doctors that can see patients for sick visits, but many do, so it is worth a call to see if yours does. If you don't know where your local health department is, you can check in your phone book. The CDC also has a tool to help you locate your state's health department, and many state websites also have listings of all of their local health departments. ° °3) Find a Walk-in Clinic °If your illness is not likely to be very severe or complicated, you may want to try a walk in clinic. These are popping up all over the country in pharmacies, drugstores, and shopping centers. They're usually staffed by nurse practitioners or physician assistants that have been trained to treat common illnesses and complaints. They're usually fairly quick and inexpensive. However, if you have serious medical issues or chronic medical problems, these are probably not your best option. ° °No Primary Care Doctor: °- Call [facility] at  832-8000 - they can help you locate a primary care doctor that  accepts your insurance, provides certain services, etc. °- Physician Referral Service- 1-800-533-3463 ° °Chronic Pain Problems: °Organization         Address  Phone   Notes  °[facility]  ([phone]) [phone] Patients need to be referred by their primary care doctor.  ° °Medication  Assistance: °Organization         Address  Phone   Notes  °[place] [facility] [address] °[place], [place] [address] (336) 641-8030 --Must be a resident of [place] °-- Must have NO insurance coverage whatsoever (no Medicaid/ Medicare, etc.) °-- The pt. MUST have a primary care doctor that directs their care regularly and follows them in the community °  °[facility]  (866) 331-1348   °[employer]  (888) 892-1162   ° °Agencies that provide inexpensive medical care: °Organization         Address  Phone   Notes  °[facility]  (336) 832-8035   °[facility]    (336) 832-7272   °[facility] [address] °[place], [place] [address] (336) 832-4777   °[facility] [address], °[place] (336) 271-4999   °[facility]    (336) 373-0678   °[facility]    (336) 272-1050   °[facility] ° [address], [place] Phone:  (336) 832-4444, Fax:  (336) 832-4440 Hours of Operation:  9 am - 6 pm, M-F.  Also accepts Medicaid/Medicare and self-pay.  °[facility] ° [address],  Phone: (336) [phone], Fax: (336) [phone]. Hours of Operation:  8:30 am - 5:30 pm, M-F.  Also accepts Medicaid and self-pay.  °[facility] [place] [address], [place] Phone: (336) 878-6027   °[facility] [address], [place], [place] (336)723-1848, Ext. 123 Mondays & Thursdays: 7-9 AM.  First 15 patients are seen on a first come, first serve basis. °  ° °Medicaid-accepting [place] Providers: ° °Organization         Address  Phone   Notes  °[facility] [address], [place] (336) [phone] Also accepts self-pay patients.  °[facility] [address], [place] ° (336) 856-9996   °[facility] [address], [place]  (336) 288-8857   °[facility] [address] [place] [address], [place] (336) 299-7000   °Veita Bland [address], [place]  ° (336) 373-1557 Only accepts [place] Access Medicaid patients after they have their name applied to their card.  ° °Self-Pay (no insurance) in [place]: ° °Organization         Address  Phone   Notes  °Sickle Cell Patients, [facility] [address], [place] (336) 832-1970   °[facility] [address], [place] (336) 832-4400   ° [facility] ° [address], [place] (336) 992-4800   °[facility]/Dr. Osei-Bonsu ° [address] [place] [address], [place] or [address], [place] (336) [phone] Phone number for both [place] and [place] locations is the same.  °[facility] [address], [place] (336) 299-0000   °[facility] [address] [place] [address], [place] or [address] (336) 852-7530 °(336) 878-2260   °[facility] [address], [place] (336) 350-1642, phone; (336) 294-5005, fax Sees patients 1st and 3rd Saturday of every month.  Must not qualify for public or private insurance (i.e. Medicaid, Medicare, [place] Health Choice, Veterans' Benefits) • Household income should be no more than 200% of the poverty level •The clinic cannot treat you if you are pregnant or think you are pregnant • Sexually transmitted diseases are not treated at the clinic.  ° ° °Dental Care: °Organization         Address  Phone  Notes  °[place] [facility] [address], [place] (336) 641-6152 Accepts children up to age 21 who are enrolled in Medicaid or [place] Health Choice; pregnant women with a Medicaid card; and children who have applied for Medicaid or [place] Health Choice, but were declined, whose parents can pay a reduced fee at time of service.  °[place]  [facility] [place]  [address], [place] (336) 641-7733 Accepts children up to age 21 who are enrolled in Medicaid or [place] Health Choice; pregnant women with a Medicaid card; and children who have applied for Medicaid or [place] Health Choice, but were declined, whose parents can pay a reduced fee at time of service.  °[facility] ° [address], [place] (336) 641-4533 Patients are seen by appointment only. Walk-ins are not accepted. [facility] will see patients 18 years of age and older. °Monday - Tuesday (8am-5pm) °Most Wednesdays (8:30-5pm) °$30 per visit, cash only  °[facility] ° [address], [place] (336) 641-4533 Patients are seen by appointment only. Walk-ins are not accepted. [facility] will see patients 18 years of age and older. °One   Wednesday Evening (Monthly: Volunteer Based).  $30 per visit, cash only  °[facility]  (919) 537-3737 for adults; Children under age 4, call Graduate Pediatric Dentistry at ([phone]) [phone]. Children aged 4-14, please call ([phone]) [phone] to request a pediatric application. ° Dental services are provided in all areas of dental care including fillings, crowns and bridges, complete and partial dentures, implants, gum treatment, root canals, and extractions. Preventive care is also provided. Treatment is provided to both adults and children. °Patients are selected via a lottery and there is often a waiting list. °  °[facility] [address], °[place] ° (336) 763-8833 [email or web address] °  °[facility] [address], [place], [place] (336)723-1848, Ext. 123 Second and Fourth Thursday of each month, opens at 6:30 AM; Clinic ends at 9 AM.  Patients are seen on a first-come first-served basis, and a limited number are seen during each clinic.  ° °[facility] ° [address], [place], [place] (336) 723-7904    Eligibility Requirements °You must have lived in [place], [place], or [place] counties for at least the last three months. °  You cannot be eligible for state or federal sponsored healthcare insurance, including Veterans Administration, Medicaid, or Medicare. °  You generally cannot be eligible for healthcare insurance through your employer.  °  How to apply: °Eligibility screenings are held every Tuesday and Wednesday afternoon from 1:00 pm until 4:00 pm. You do not need an appointment for the interview!  °[facility] [address], [place], [place] 336-631-2330   °[facility]  336-342-8273   °[facility]  336-703-3100   °[facility]  336-570-6415   ° °Behavioral Health Resources in the Community: °Intensive Outpatient Programs °Organization         Address  Phone  Notes  °[facility] [address], [place], [place] 336-878-6098   °[facility] [address], [place], [place] 336-832-9800   °[facility] [address], [place], [place] ° 336-882-2125   °[facility] [address],  °[place], [place] 1-800-853-5163 or 336-641-4981   °Substance Abuse Resources °Organization         Address  Phone  Notes  °[facility]  336-882-2125   °[facility]  336-784-9470   °[facility]  336-285-9073   °[facility]  336-845-3988   °Residential & Outpatient Substance Abuse Program  1-800-659-3381   °Psychological Services °Organization         Address  Phone  Notes  °[facility]  336- 832-9600   °[facility]  336- 378-7881   °[facility] [address], [place] 1-800-853-5163 or 336-641-4981   ° °Mobile Crisis Teams °Organization         Address  Phone  Notes  °[facility], Mobile Crisis Care Unit  1-877-626-1772   °Assertive °[facility] ° [address]  [place], [place] 336-834-9664   °Sharon DeEsch [address] °[place] 336-554-5454   ° °Self-Help/Support Groups °Organization         Address  Phone             Notes  °[facility] [place] - variety of support groups  336- 373-1402 Call for more information  °Narcotics Anonymous (NA), [facility] [address], °[place]  2 meetings at this location  ° °  Residential Treatment Programs Organization         Address  Phone  Notes  [facility] [address]    [place]  1-2340939984   [facility]  [address], [place] [address], [place], [place]   [place], [place] (678)[phone] Admissions: 8am-3pm M-F  [facility] [place] [address]    [place], [place] 734-287-6811   [facility] [address] [place], [place], [place]   [facility] [address],  [place], [place]   [facility] - Intensive Outpatient [address], [place], [place]   [facility] ([place].,  [place], [place] 1-631 883 7941 or (314) 299-6876   [facility] [address], [place], [place] Accepts Medicaid  [facility] [address]  [place] 1-970-686-7939 Substance Abuse/Addiction Treatment   [facility] Organization         Address  Phone  Notes  [facility]  7601859835   Domenic Schwab, PhD [address] [place], [place]   315-360-0248 or 805-014-6559   [place], [place] [phone]   [facility] [address] [place], [place] 904-721-3818 Insurance/Medicaid/sponsorship through [facility] [address]                                    [place], [place] [phone] [place] [address], [place] [phone]    Dr. Adele Schilder  ([phone])[phone]   [facility] Dept. 1) [address] [place] 2) [place] 3)  [address], [place] [phone]  ([phone]) [phone]   [place] 307-086-5135 or (618)[phone] (After Hours)       Fill the prescriptions written by your family doctor and take them as directed.  Apply moist heat or ice to the area(s) of discomfort, for 15 minutes at a time, several times per day for the next few days.  Do not fall asleep on a heating or ice pack.  Call your regular medical doctor today to schedule a follow up appointment this week. Call your Neurosurgeon today to schedule a follow up appointment within the next week. Return to the Emergency Department immediately if worsening.

## 2013-11-02 NOTE — ED Provider Notes (Signed)
CSN: [id number]     Arrival date & time 11/02/13  0721 History   First MD Initiated Contact with Patient 11/02/13 0732     Chief Complaint  Patient presents with  . Back Pain  . Leg Pain      HPI Pt was seen at 0730. Per pt, c/o gradual onset and persistence of constant acute flair of her chronic low back "pain" for the past 3 to 4 weeks. States the pain radiates into her left buttocks/thigh area. Denies any change in her pain pattern.  Pain worsens with palpation of the area and body position changes. Denies incont/retention of bowel or bladder, no saddle anesthesia, no focal motor weakness, no tingling/numbness in extremities, no fevers, no injury, no abd pain. The patient has a significant history of similar symptoms previously, recently being evaluated for this complaint and multiple prior evals for same. Pt was evaluated by her PMD 3 days ago for same, dx "arthritis," and rx norco, flexeril, and naprosyn. Pt states she "hasn't had time to fill the prescriptions yet," so she came to the ED for "a dose of medicines."     Neurosurgeon: Dr. Vertell Limber Past Medical History  Diagnosis Date  . Hyperlipidemia   . Hypertension   . Diabetes mellitus   . Asthma   . 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 [name], residual palpitations  . Vocal cord dysfunction   . Esophageal reflux   . Tremor, essential     Neurontin 600 mg qhs x 1 yr--no help.  Inderal prior to this was helpful for 10 yrs then stopped working.  . Anxiety   . Allergic rhinitis   . Low sodium syndrome   . Depression   . Chronic back pain   . Lumbar radiculopathy   . DDD (degenerative disc disease), lumbar    Past Surgical History  Procedure Laterality Date  . Tonsillectomy    . Right breast cyst      benign  . Left ankle ligament repair    . Left elbow repair    . Partial hysterectomy    . Cholecystectomy    . Cesarean section    . Ablation for avnrt    . Colonoscopy   01/16/2004    XKG:YJEHUD rectum/colon  . Esophagogastroduodenoscopy (egd) with esophageal dilation  04/03/2002    JSH:FWYOVZCH'Y ring, otherwise normal esophagus, status post dilation with 56 F/Normal stomach  . Bartholin gland cyst excision    . Esophagogastroduodenoscopy (egd) with esophageal dilation N/A 11/08/2012    Procedure: ESOPHAGOGASTRODUODENOSCOPY (EGD) WITH ESOPHAGEAL DILATION;  Surgeon: Daneil Dolin, MD;  Location: [facility];  Service: Endoscopy;  Laterality: N/A;  11:30   Family History  Problem Relation Age of Onset  . Lung cancer Father     DIED AGE 54 LUNG CA  . Alcohol abuse Father   . Anxiety disorder Father   . Depression Father   . COPD Mother     DIED AGE 40,MRSA,COPD,PNEUMONIA  . Pneumonia Mother     DIED AGE 40,MRSA,COPD,PNEUMONIA  . Supraventricular tachycardia Mother   . Anxiety disorder Mother   . Depression Mother   . Alcohol abuse Mother   . COPD Brother     AGE 44  . Heart disease Sister     DIED AGE 60, SMOKER,?HEART  . Colon cancer Neg Hx    History  Substance Use Topics  . Smoking status: Former Smoker -- 0.30 packs/day for 10 years  Types: Cigarettes    Quit date: 04/06/1985  . Smokeless tobacco: Never Used  . Alcohol Use: No    Review of Systems ROS: Statement: All systems negative except as marked or noted in the HPI; Constitutional: Negative for fever and chills. ; ; Eyes: Negative for eye pain, redness and discharge. ; ; ENMT: Negative for ear pain, hoarseness, nasal congestion, sinus pressure and sore throat. ; ; Cardiovascular: Negative for chest pain, palpitations, diaphoresis, dyspnea and peripheral edema. ; ; Respiratory: Negative for cough, wheezing and stridor. ; ; Gastrointestinal: Negative for nausea, vomiting, diarrhea, abdominal pain, blood in stool, hematemesis, jaundice and rectal bleeding. . ; ; Genitourinary: Negative for dysuria, flank pain and hematuria. ; ; Musculoskeletal: +LBP. Negative for neck pain. Negative for  swelling and trauma.; ; Skin: Negative for pruritus, rash, abrasions, blisters, bruising and skin lesion.; ; Neuro: Negative for headache, lightheadedness and neck stiffness. Negative for weakness, altered level of consciousness , altered mental status, extremity weakness, paresthesias, involuntary movement, seizure and syncope.      Allergies  Metformin and related; Guaifenesin; Morphine and related; Oxycodone-acetaminophen; and Guaifenesin er  Home Medications   Prior to Admission medications   Medication Sig Start Date End Date Taking? Authorizing Provider  albuterol (PROAIR HFA) 108 (90 BASE) MCG/ACT inhaler Inhale 2 puffs into the lungs every 6 (six) hours as needed for wheezing or shortness of breath. 08/03/13   Tammy S Parrett, NP  albuterol (PROVENTIL) (2.5 MG/3ML) 0.083% nebulizer solution Take 2.5 mg by nebulization every 6 (six) hours as needed for wheezing or shortness of breath.    Historical Provider, MD  aspirin EC 81 MG tablet Take 81 mg by mouth every morning.     Historical Provider, MD  cyclobenzaprine (FLEXERIL) 10 MG tablet Take 0.5 tablets (5 mg total) by mouth 3 (three) times daily as needed for muscle spasms. 03/07/13   Lyndal Pulley, DO  diphenhydrAMINE (SOMINEX) 25 MG tablet Take 25 mg by mouth as needed for sleep.    Historical Provider, MD  DULoxetine (CYMBALTA) 60 MG capsule Take 1 capsule (60 mg total) by mouth daily. 08/07/13 08/07/14  Levonne Spiller, MD  fluticasone-salmeterol (ADVAIR HFA) [phone] MCG/ACT inhaler Inhale 2 puffs into the lungs 2 (two) times daily. 10/10/13   Deneise Lever, MD  HYDROcodone-acetaminophen (NORCO/VICODIN) 5-325 MG per tablet Take 1 tablet by mouth every 6 (six) hours as needed for moderate pain. 10/30/13   Janith Lima, MD  insulin NPH-regular Human (NOVOLIN 70/30) (70-30) 100 UNIT/ML injection 90 units with breakfast and 20 units with evening meal and syringes 2/day. 10/13/13   Renato Shin, MD  Insulin Syringe-Needle U-100 (B-D INS SYRINGE  0.5CC/30GX1/2") 30G X 1/2" 0.5 ML MISC Use 1 per day to inject insulin. 08/15/13   Renato Shin, MD  levothyroxine (SYNTHROID, LEVOTHROID) 75 MCG tablet Take 75 mcg by mouth daily before breakfast.    Historical Provider, MD  LORazepam (ATIVAN) 1 MG tablet Take 1 mg by mouth as needed. 08/07/13 08/07/14  Levonne Spiller, MD  losartan (COZAAR) 100 MG tablet Take 1 tablet (100 mg total) by mouth daily. 10/30/13   Janith Lima, MD  metoprolol succinate (TOPROL-XL) 50 MG 24 hr tablet Take 1 tablet (50 mg total) by mouth 2 (two) times daily. Take with or immediately following a meal. 05/04/13   Janith Lima, MD  mometasone (NASONEX) 50 MCG/ACT nasal spray Place 2 sprays into the nose every morning.     Historical Provider, MD  naproxen (NAPROSYN)  500 MG tablet  05/02/13   Historical Provider, MD  primidone (MYSOLINE) 50 MG tablet Take 3 tablets (150 mg total) by mouth 2 (two) times daily. 06/12/13   Janith Lima, MD  promethazine-codeine (PHENERGAN WITH CODEINE) 6.25-10 MG/5ML syrup Take 5 mLs by mouth as needed for cough. 08/03/13   Tammy S Parrett, NP  ranitidine (ZANTAC) 150 MG tablet Take 150 mg by mouth daily.    Historical Provider, MD  sertraline (ZOLOFT) 100 MG tablet Take 1.5 tablets (150 mg total) by mouth daily. 08/07/13   Levonne Spiller, MD   BP 163/78  Pulse 113  Temp(Src) 97.9 F (36.6 C) (Oral)  Resp 20  SpO2 100% Physical Exam 0735: Physical examination:  Nursing notes reviewed; Vital signs and O2 SAT reviewed;  Constitutional: Well developed, Well nourished, Well hydrated, In no acute distress; Head:  Normocephalic, atraumatic; Eyes: EOMI, PERRL, No scleral icterus; ENMT: Mouth and pharynx normal, Mucous membranes moist; Neck: Supple, Full range of motion, No lymphadenopathy; Cardiovascular: Regular rate and rhythm, No gallop; Respiratory: Breath sounds clear & equal bilaterally, No wheezes.  Speaking full sentences with ease, Normal respiratory effort/excursion; Chest: Nontender, Movement  normal; Abdomen: Soft, Nontender, Nondistended, Normal bowel sounds; Genitourinary: No CVA tenderness; Spine:  No midline CS, TS, LS tenderness. +TTP left lumbar paraspinal muscles.;; Extremities: Pulses normal, No tenderness, No edema, No calf edema or asymmetry.; Neuro: AA&Ox3, Major CN grossly intact.  Speech clear. No gross focal motor or sensory deficits in extremities. Climbs on and off stretcher easily by herself. Gait steady. Strength 5/5 equal bilat UE's and LE's, including great toe dorsiflexion.  DTR 1-2/4 equal bilat UE's and LE's.  No gross sensory deficits.  Neg straight leg raises bilat.; Skin: Color normal, Warm, Dry.    ED Course  Procedures  MDM  MDM Reviewed: previous chart, nursing note and vitals Reviewed previous: x-ray     Dg Lumbar Spine Complete 10/30/2013   CLINICAL DATA:  Left low back pain with sciatica for 2 weeks  EXAM: LUMBAR SPINE - COMPLETE 4+ VIEW  COMPARISON:  None.  FINDINGS: Mild dextroscoliosis lower lumbar spine. At L4-5, there is degenerative disc disease of moderate severity particularly along the medial margin which is along the inner curvature of the scoliosis. There is bilateral L4-5 and L5-S1 facet sclerosis. There is normal anterior-posterior alignment. There is mild L2-3 and L3-4 degenerative disc disease.  IMPRESSION: Scoliosis with degenerative changes.   Electronically Signed   By: Skipper Cliche M.D.   On: 10/30/2013 16:19    0755:  Pt has been evaluated by her PMD for same 3 days ago, received rx norco but did not get it filled. EPIC chart reviewed: exam today c/w ofc exam. Pt came to the ED "just for a dose of medicines until I can get to the pharmacy to fill my rx." Requesting dose of flexeril and norco. Medication dosed. Pt wants to go home now.  Pt encouraged to fill her prescriptions and f/u with her PMD and Neurosurgeon for good continuity of care and control of her pain.  Verb understanding.    Alfonzo Feller, DO 11/05/13 1545

## 2013-11-07 ENCOUNTER — Ambulatory Visit (INDEPENDENT_AMBULATORY_CARE_PROVIDER_SITE_OTHER): Payer: No Typology Code available for payment source | Admitting: Psychiatry

## 2013-11-07 ENCOUNTER — Ambulatory Visit (INDEPENDENT_AMBULATORY_CARE_PROVIDER_SITE_OTHER): Payer: Medicare Other

## 2013-11-07 ENCOUNTER — Encounter (HOSPITAL_COMMUNITY): Payer: Self-pay | Admitting: Psychiatry

## 2013-11-07 ENCOUNTER — Telehealth: Payer: Self-pay | Admitting: Endocrinology

## 2013-11-07 ENCOUNTER — Ambulatory Visit (INDEPENDENT_AMBULATORY_CARE_PROVIDER_SITE_OTHER): Payer: Medicare Other | Admitting: Family Medicine

## 2013-11-07 ENCOUNTER — Encounter: Payer: Self-pay | Admitting: Family Medicine

## 2013-11-07 ENCOUNTER — Ambulatory Visit (HOSPITAL_COMMUNITY): Payer: Self-pay | Admitting: Psychiatry

## 2013-11-07 ENCOUNTER — Encounter: Payer: Self-pay | Admitting: Internal Medicine

## 2013-11-07 VITALS — BP 130/90 | Ht 65.0 in | Wt 193.0 lb

## 2013-11-07 VITALS — BP 136/84 | HR 115 | Ht 65.0 in | Wt 194.0 lb

## 2013-11-07 DIAGNOSIS — M5416 Radiculopathy, lumbar region: Secondary | ICD-10-CM

## 2013-11-07 DIAGNOSIS — M25559 Pain in unspecified hip: Secondary | ICD-10-CM

## 2013-11-07 DIAGNOSIS — IMO0002 Reserved for concepts with insufficient information to code with codable children: Secondary | ICD-10-CM

## 2013-11-07 DIAGNOSIS — M25552 Pain in left hip: Secondary | ICD-10-CM

## 2013-11-07 DIAGNOSIS — F411 Generalized anxiety disorder: Secondary | ICD-10-CM

## 2013-11-07 DIAGNOSIS — M7062 Trochanteric bursitis, left hip: Secondary | ICD-10-CM | POA: Insufficient documentation

## 2013-11-07 DIAGNOSIS — M545 Low back pain, unspecified: Secondary | ICD-10-CM

## 2013-11-07 DIAGNOSIS — M76899 Other specified enthesopathies of unspecified lower limb, excluding foot: Secondary | ICD-10-CM

## 2013-11-07 MED ORDER — DULOXETINE HCL 60 MG PO CPEP: 60.0000 mg | ORAL_CAPSULE | Freq: Every day | ORAL | Status: DC

## 2013-11-07 MED ORDER — LORAZEPAM 1 MG PO TABS: 1.0000 mg | ORAL_TABLET | Freq: Every day | ORAL | Status: DC | PRN

## 2013-11-07 MED ORDER — SERTRALINE HCL 100 MG PO TABS: 150.0000 mg | ORAL_TABLET | Freq: Every day | ORAL | Status: DC

## 2013-11-07 NOTE — Progress Notes (Signed)
CC: Left hip pain and back  HPI: Patient is coming in with back pain as well as left hip pain. Patient has been seen by primary care provider and was given hydrocodone for potential lumbar radiculopathy. Patient was sent for x-rays which were reviewed by me. Patient's x-ray show severe osteoarthritic changes at multiple levels especially L4-L5 and L5-S1.    Past medical, surgical, family and social history reviewed. Medications reviewed all in the electronic medical record.   Review of Systems: No headache, visual changes, nausea, vomiting, diarrhea, constipation, dizziness, abdominal pain, skin rash, fevers, chills, night sweats, weight loss, swollen lymph nodes, body aches, joint swelling, muscle aches, chest pain, shortness of breath, mood changes.   Objective:    Blood pressure 136/84, pulse 115, height 5\' 5"  (1.651 m), weight 194 lb (87.998 kg), SpO2 95.00%.   General: No apparent distress alert and oriented x3 mood and affect normal, dressed appropriately. Patient does have a resting tremor HEENT: Pupils equal, extraocular movements intact Respiratory: Patient's speak in full sentences and does not appear short of breath Cardiovascular: No lower extremity edema, non tender, no erythema Skin: Warm dry intact with no signs of infection or rash on extremities or on axial skeleton. Abdomen: Soft nontender Neuro: Cranial nerves II through XII are intact, neurovascularly intact in all extremities with 2+ DTRs and 2+ pulses. Lymph: No lymphadenopathy of posterior or anterior cervical chain or axillae bilaterally.  Gait mild wide-base MSK: Non tender with full range of motion and good stability and symmetric strength and tone of shoulders, elbows,   knees bilaterally.  Back Exam:  Inspection: Unremarkable  Motion: Flexion 35 deg, Extension 35 deg, Side Bending to 35 deg bilaterally,  Rotation to 45 deg bilaterally  SLR laying: Moderately positive left in the radiation of the S1 nerve  root XSLR laying: Negative  Palpable tenderness: Positive over piriformis and severely over the greater trochanteric bursa FABER: Positive actually tighter than previous exam  Sensory change: Gross sensation intact to all lumbar and sacral dermatomes.  Reflexes: 2+ at both patellar tendons, 2+ at achilles tendons, Babinski's downgoing.  Strength at foot  Plantar-flexion: 5/5 Dorsi-flexion: 5/5 Eversion: 5/5 Inversion: 5/5  Leg strength  Quad: 5/5 Hamstring: 5/5 Hip flexor: 5/5 Hip abductors: 3/5  Gait unremarkable.  Patient's quadriceps seem to be moderately atrophied compared to previous visit   Procedure: Real-time Ultrasound Guided Injection of left  greater trochanteric bursitis secondary to patient's body habitus Device: GE Logiq E  Ultrasound guided injection is preferred based studies that show increased duration, increased effect, greater accuracy, decreased procedural pain, increased response rate, and decreased cost with ultrasound guided versus blind injection.  Verbal informed consent obtained.  Time-out conducted.  Noted no overlying erythema, induration, or other signs of local infection.  Skin prepped in a sterile fashion.  Local anesthesia: Topical Ethyl chloride.  With sterile technique and under real time ultrasound guidance:  Greater trochanteric area was visualized and patient's bursa was noted. A 22-gauge 3 inch needle was inserted and 4 cc of 0.5% Marcaine and 1 cc of Kenalog 40 mg/dL was injected. Pictures taken Completed without difficulty  Pain immediately resolved suggesting accurate placement of the medication.  Advised to call if fevers/chills, erythema, induration, drainage, or persistent bleeding.  Images permanently stored and available for review in the ultrasound unit.  Impression: Technically successful ultrasound guided injection.     Impression and Recommendations:     This case required medical decision making of moderate complexity. Spent  greater  than 25 minutes with patient face-to-face and had greater than 50% of counseling including as described above in assessment and plan.

## 2013-11-07 NOTE — Assessment & Plan Note (Signed)
Patient did respond very well to injection today. I do think that there is likely radicular symptoms from her back that could also be contributing to the discomfort. Patient will do some icing and topical anti-inflammatories. Patient and will try the home exercises that was given to her. Patient will followup again but has other more pressing medical issues in the near future.

## 2013-11-07 NOTE — Telephone Encounter (Signed)
Pt was told to call if Blood sugars have been above 200 for 3 to 5.
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
## 2013-11-07 NOTE — Assessment & Plan Note (Signed)
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 Discuss home exercises and was given a handout. We also discussed over-the-counter medications. I do think further imaging is warranted secondary to the radicular symptoms. In addition to this patient is also having other surgeries in the near future that would make is unable to have an MRI. Patient is having some atrophy of the quadriceps which also increases the likelihood of [place] needing to have this MRI for further evaluation. Patient will have this scheduled. Patient did respond fairly well to a greater trochanteric injection today which makes me optimistic that hopefully most of this is muscular skeletal. We discussed with patient not taking the hydrocodone with Tylenol. Patient will try the range of motion exercises 3 times a week as well as the icing protocol and come back for further evaluation.

## 2013-11-07 NOTE — Patient Instructions (Signed)
Good to see you! I wish you the best with the upcoming the arthritis.  Gave you an injection today.  Ice 20 minutes 2 times daily. Usually after activity and before bed. Exercises 3 times a week for hip and back.  We will get MRI of back to have in case we need to do epidural .  See me when you need me.

## 2013-11-07 NOTE — Telephone Encounter (Signed)
See below pt's blood sugars have been above 200 for 3 to 5 days. Currently taking 90 units of novolin with breakfast and 20 units with her evening meal. Could you please advise during Dr. Cordelia Pen absence. Thanks!

## 2013-11-07 NOTE — Telephone Encounter (Signed)
I need a little more details - when are the sugars high? Any lows?

## 2013-11-08 NOTE — Telephone Encounter (Signed)
Pt called [facility] after hours on 8/4. Pt stated in report. Pt received a steroid injection on 8/4 and her sugar increased to 433. Call a nurse spoke with Dr. Kennieth Rad and was instructed to increase her novolin to 30 units. Called and lvom for pt call back to discuss about changes that were made and to follow up with her blood sugar readings.

## 2013-11-08 NOTE — Telephone Encounter (Signed)
Could you please see below and advised pt states that she has been having high reading of 200 every morning for the past two weeks, Pt is have deep brain stimulation and would like to get her blood sugars under better control. Pt is taking 90 units of Novolin in the morning and 30 units in the evening per Dr. Raliegh Ip last night when the pt call [facility].  Thanks!

## 2013-11-09 ENCOUNTER — Telehealth: Payer: Self-pay | Admitting: *Deleted

## 2013-11-09 MED ORDER — GABAPENTIN 100 MG PO CAPS: ORAL_CAPSULE | ORAL | Status: DC

## 2013-11-09 NOTE — Telephone Encounter (Signed)
She can increase her evening dose by 10 units to 40

## 2013-11-09 NOTE — Telephone Encounter (Signed)
Pt stated she is having severe pain in her hip & leg. She has taking the hydrocodone, but its not helping. She stated her MRI has been set up for the 12th. Wanting md recommendation on the increase pain...Johny Chess

## 2013-11-09 NOTE — Telephone Encounter (Signed)
I am sorry she is hurting.  If she would like I would do Gabapentin would start with 100mg  at night and increase in 3 days to 200mg  and then 300mg  3 days after that.  Called patient and talked to her. Discussed side effects but patient is willing to try.

## 2013-11-09 NOTE — Telephone Encounter (Signed)
Notified pt with md response pt agreed to start taking the gabapentin. Wanting med sent to [facility] in [place]...Johny Chess

## 2013-11-10 NOTE — H&P (Addendum)
PAT Pre-Op History & Physical    Patient: Diane Riggs                  MRN: [id number]          SSN: [id number]  Date of Birth: May 28, 1950          Age: 63 y.o.             Sex: female                Subjective:   Patient is a 63 y.o. Caucasian female who presents with history of chronic bilateral shoulder pain L>R. Failed PT, cortisone injections, and Tramadol use. Right hand dominant. She reports that she injured both shoulders 1 year ago when she was renovating her garage.  The patient was evaluated in surgeon's office and it was determined that the most appropriate plan of care is to proceed with surgical intervention.  Patient's PCP Murvin NatalJohn M Daniel III, MD (General)            Past Medical History   Diagnosis Date   ??? Cancer [facility]([name])      breast CA 2008      Past Surgical History   Procedure Laterality Date   ??? Hx gyn  1980     Partial Hysterectomy   ??? Hx gyn  2002     BSO   ??? Hx gyn  2010     Cystocele and rectocele repair/ bladder sling   ??? Hx orthopaedic  1999     Elbow for tendonitis   ??? Hx orthopaedic Left 2013     Carpal Tunnel Release, distal thumb arthroplasty   ??? Hx cataract removal Bilateral    ??? Hx cholecystectomy  2006   ??? Pr abdomen surgery proc unlisted  2006     Umbilical hernia repair   ??? Pr breast surgery procedure unlisted Right      Breast lumpectomy 2008   ??? Pr breast surgery procedure unlisted Bilateral      Mastectomy      Prior to Admission medications    Medication Sig Start Date End Date Taking? Authorizing Provider   rosuvastatin (CRESTOR) 10 mg tablet Take 10 mg by mouth nightly.   Yes Historical Provider   dicyclomine (BENTYL) 20 mg tablet Take 20 mg by mouth three (3) times daily as needed.   Yes Historical Provider   flunisolide (NASAREL) 25 mcg (0.025 %) spry 1 Spray by Both Nostrils route daily.   Yes Historical Provider   calcitonin, salmon, (MIACALCIN) nasal 1 Spray by IntraNASal route daily.   Yes Historical Provider    aspirin delayed-release 81 mg tablet Take 81 mg by mouth daily.   Yes Historical Provider   CALCIUM CARBONATE/VITAMIN D3 (CALCIUM-D PO) Take 1,200 mg by mouth daily.   Yes Historical Provider   omega-3 fatty acids-vitamin e (FISH OIL) 1,000 mg cap Take 1 Cap by mouth daily.   Yes Historical Provider   cholecalciferol (VITAMIN D3) 1,000 unit tablet Take 1,000 Units by mouth daily.   Yes Historical Provider   Bifidobacterium Infantis (ALIGN) 4 mg cap Take 1 Tab by mouth nightly.   Yes Historical Provider   multivitamin (ONE A DAY) tablet Take 1 Tab by mouth daily.   Yes Historical Provider     Current Outpatient Prescriptions   Medication Sig   ??? rosuvastatin (CRESTOR) 10 mg tablet Take 10 mg by mouth nightly.   ??? dicyclomine (BENTYL) 20 mg tablet Take 20 mg  by mouth three (3) times daily as needed.   ??? flunisolide (NASAREL) 25 mcg (0.025 %) spry 1 Spray by Both Nostrils route daily.   ??? calcitonin, salmon, (MIACALCIN) nasal 1 Spray by IntraNASal route daily.   ??? aspirin delayed-release 81 mg tablet Take 81 mg by mouth daily.   ??? CALCIUM CARBONATE/VITAMIN D3 (CALCIUM-D PO) Take 1,200 mg by mouth daily.   ??? omega-3 fatty acids-vitamin e (FISH OIL) 1,000 mg cap Take 1 Cap by mouth daily.   ??? cholecalciferol (VITAMIN D3) 1,000 unit tablet Take 1,000 Units by mouth daily.   ??? Bifidobacterium Infantis (ALIGN) 4 mg cap Take 1 Tab by mouth nightly.   ??? multivitamin (ONE A DAY) tablet Take 1 Tab by mouth daily.     No current facility-administered medications for this encounter.      Allergies   Allergen Reactions   ??? Ampicillin Rash   ??? Sulfa (Sulfonamide Antibiotics) Nausea Only      History   Substance Use Topics   ??? Smoking status: Current Every Day Smoker -- 1.00 packs/day for 30 years   ??? Smokeless tobacco: Never Used   ??? Alcohol Use: 0.6 oz/week     1 Glasses of wine per week      Comment: glass of wine a day      Family History   Problem Relation Age of Onset   ??? Heart Disease Mother    ??? Cancer Mother      breast    ??? Heart Disease Father         Review of Systems    Patient denies chest pain, tightness, pain radiating down left arm, palpitations, dizziness, lightheadedness, fevers, chills. Patient denies shortness of breath, wheezing, diarrhea, constipation, abdominal pain. Patient denies urinary problems, dysuria, hesitancy, urgency. Denies skin breakdown, rashes, insect bites or open area.        Objective:   Patient Vitals for the past 8 hrs:   BP Temp Pulse Resp SpO2 Height Weight   11/10/13 1434 118/59 mmHg 97.5 ??F (36.4 ??C) 69 16 97 % 5\' 3"  (1.6 m) 40.597 kg (89 lb 8 oz)     Temp (24hrs), Avg:97.5 ??F (36.4 ??C), Min:97.5 ??F (36.4 ??C), Max:97.5 ??F (36.4 ??C)      Physical Exam:     General: Alert, cooperative, no distress, appears stated age.   Eyes: Conjunctivae/corneas clear. EOMs intact.   Ears: Normal TMs and external ear canals both ears.   Nose: Nares normal.   Mouth/Throat: Lips, mucosa, and tongue normal. Teeth and gums normal.   Neck: Supple, symmetrical, trachea midline.   Back: Symmetric   Lungs: Clear to auscultation bilaterally.   Heart: Regular rate and rhythm, S1, S2 normal, no murmur, click, rub or gallop.   Abdomen: Soft, non-tender. Bowel sounds normal. No masses, No                      Organumegaly.   Musculoskeletal:  Decreased ROM in left shoulder   Extremities:Extremities normal, atraumatic, no cyanosis or edema.  Calfs                                 Supple, non tender to palpation.   Pulses: 2+ and symmetric on upper extremities.  CR  <2 seconds   Skin: Skin color, texture, turgor normal.  No rashes or lesions.   Lymph nodes: No adenopathy.   Neurologic: CNII-XII  grossly intact.  Neurovascularly intact distally.    Labs: No results found for this or any previous visit (from the past 24 hour(s)).    Assessment:       Chronic bilateral shoulder pain, L>R      Plan:     Scheduled for left shoulder arthroscopy, AC joint resection, subacromial decompression and rotator cuff repair     Ampicillin causes a rash. Faxed to Dr. Leandrew Koyanagi office and I requested that Dr. Rodney Langton be asked if he wants to order an alternative pre-op antibiotic.   11-13-13- received new orders for new antibiotic order-  Clindamycin 600 mg IV     .    Diego Cory, NP

## 2013-11-13 ENCOUNTER — Telehealth: Payer: Self-pay | Admitting: Endocrinology

## 2013-11-13 ENCOUNTER — Encounter: Payer: Self-pay | Admitting: Endocrinology

## 2013-11-13 ENCOUNTER — Ambulatory Visit (INDEPENDENT_AMBULATORY_CARE_PROVIDER_SITE_OTHER): Payer: Medicare Other | Admitting: Endocrinology

## 2013-11-13 VITALS — BP 134/84 | HR 90 | Temp 98.2°F | Ht 65.0 in | Wt 194.0 lb

## 2013-11-13 DIAGNOSIS — IMO0001 Reserved for inherently not codable concepts without codable children: Secondary | ICD-10-CM

## 2013-11-13 DIAGNOSIS — E1165 Type 2 diabetes mellitus with hyperglycemia: Principal | ICD-10-CM

## 2013-11-13 MED ORDER — GLUCOSE BLOOD VI STRP: 1.0000 | ORAL_STRIP | Freq: Two times a day (BID) | Status: DC

## 2013-11-13 NOTE — Telephone Encounter (Signed)
Pt seen by Dr. Loanne Drilling on 8/10. Insulin dosage addressed during OV.

## 2013-11-13 NOTE — Progress Notes (Signed)
Subjective:    Patient ID: [name], female    DOB: 1950-09-16, 63 y.o.   MRN: [id number]  HPI pt returns for f/u of insulin-requiring DM (dx'ed 1994, on a routine blood test; she has mild if any neuropathy of the lower extremities; she is unaware of any associated chronic complications; she has been on insulin since 2012; in November of 2014, she was changed to a simpler insulin regimen, due to poor results with multiple daily injections; she was admitted to hospital with DKA in early 2015 after she ran out of insulin; she says this was because her insurance did not pay for it, but this problem has been resolved; she takes human insulin, due to cost; she has never had GDM or pancreatitis; her last episode of severe hypoglycemia was in may of 2015 (twice), due to an aggressive dietary effort).  In early 2015, she went on an aggressive diet, and her insulin requirement went down   She will have surgery soon for tremor. she brings a record of her cbg's which i have reviewed today.  It is persistently in the 200's (despits increasing pm insulin to 30 units).  There is no trend throughout the day, except it is slightly higher in am.   She reports 1 week of slight pain at the left thigh, but no assoc numbness.   Past Medical History  Diagnosis Date  . Hyperlipidemia   . Hypertension   . Diabetes mellitus   . Asthma   . 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 [name], residual palpitations  . Vocal cord dysfunction   . Esophageal reflux   . Tremor, essential     Neurontin 600 mg qhs x 1 yr--no help.  Inderal prior to this was helpful for 10 yrs then stopped working.  . Anxiety   . Allergic rhinitis   . Low sodium syndrome   . Depression   . Chronic back pain   . Lumbar radiculopathy   . DDD (degenerative disc disease), lumbar     Past Surgical History  Procedure Laterality Date  . Tonsillectomy    . Right breast cyst      benign  . Left  ankle ligament repair    . Left elbow repair    . Partial hysterectomy    . Cholecystectomy    . Cesarean section    . Ablation for avnrt    . Colonoscopy  01/16/2004    LYY:TKPTWS rectum/colon  . Esophagogastroduodenoscopy (egd) with esophageal dilation  04/03/2002    FKC:LEXNTZGY'F ring, otherwise normal esophagus, status post dilation with 56 F/Normal stomach  . Bartholin gland cyst excision    . Esophagogastroduodenoscopy (egd) with esophageal dilation N/A 11/08/2012    Procedure: ESOPHAGOGASTRODUODENOSCOPY (EGD) WITH ESOPHAGEAL DILATION;  Surgeon: Daneil Dolin, MD;  Location: [facility];  Service: Endoscopy;  Laterality: N/A;  11:30    History   Social History  . Marital Status: Married    Spouse Name: N/A    Number of Children: N/A  . Years of Education: N/A   Occupational History  . retired    Social History Main Topics  . Smoking status: Former Smoker -- 0.30 packs/day for 10 years    Types: Cigarettes    Quit date: 04/06/1985  . Smokeless tobacco: Never Used  . Alcohol Use: No  . Drug Use: No  . Sexual Activity: No   Other Topics Concern  . Not on file  Social History Narrative   Married, 1 daughte, living.  Lives with husband in [place], [place].   1 child died brain tumor at age 62.   Two grandchildren.   Works at [employer], patient currently lost her job.   Retired 2011.   No tobacco.   Alcohol: none in 30 yrs.  Distant history of heavy use.   No drug use.    Current Outpatient Prescriptions on File Prior to Visit  Medication Sig Dispense Refill  . albuterol (PROAIR HFA) 108 (90 BASE) MCG/ACT inhaler Inhale 2 puffs into the lungs every 6 (six) hours as needed for wheezing or shortness of breath.  8.5 g  5  . albuterol (PROVENTIL) (2.5 MG/3ML) 0.083% nebulizer solution Take 2.5 mg by nebulization every 6 (six) hours as needed for wheezing or shortness of breath.      Marland Kitchen aspirin EC 81 MG tablet Take 81 mg by mouth every morning.       .  cyclobenzaprine (FLEXERIL) 10 MG tablet Take 0.5 tablets (5 mg total) by mouth 3 (three) times daily as needed for muscle spasms.  30 tablet  1  . diphenhydrAMINE (SOMINEX) 25 MG tablet Take 25 mg by mouth as needed for sleep.      . DULoxetine (CYMBALTA) 60 MG capsule Take 1 capsule (60 mg total) by mouth daily.  30 capsule  2  . fluticasone-salmeterol (ADVAIR HFA) 115-21 MCG/ACT inhaler Inhale 2 puffs into the lungs 2 (two) times daily.  1 Inhaler  prn  . gabapentin (NEURONTIN) 100 MG capsule 1 pill nightly for 3 days, then 2 pills nightly for 3 nights then 3 pills nightly therafter.  90 capsule  3  . HYDROcodone-acetaminophen (NORCO/VICODIN) 5-325 MG per tablet Take 1 tablet by mouth every 6 (six) hours as needed for moderate pain.  65 tablet  0  . Insulin Syringe-Needle U-100 (B-D INS SYRINGE 0.5CC/30GX1/2") 30G X 1/2" 0.5 ML MISC Use 1 per day to inject insulin.  100 each  1  . levothyroxine (SYNTHROID, LEVOTHROID) 75 MCG tablet Take 75 mcg by mouth daily before breakfast.      . LORazepam (ATIVAN) 1 MG tablet Take 1 tablet (1 mg total) by mouth daily as needed.  30 tablet  2  . losartan (COZAAR) 100 MG tablet Take 1 tablet (100 mg total) by mouth daily.  90 tablet  3  . metoprolol succinate (TOPROL-XL) 50 MG 24 hr tablet Take 1 tablet (50 mg total) by mouth 2 (two) times daily. Take with or immediately following a meal.  60 tablet  11  . mometasone (NASONEX) 50 MCG/ACT nasal spray Place 2 sprays into the nose every morning.       . naproxen (NAPROSYN) 500 MG tablet       . primidone (MYSOLINE) 50 MG tablet Take 3 tablets (150 mg total) by mouth 2 (two) times daily.  180 tablet  5  . promethazine-codeine (PHENERGAN WITH CODEINE) 6.25-10 MG/5ML syrup Take 5 mLs by mouth as needed for cough.      . ranitidine (ZANTAC) 150 MG tablet Take 150 mg by mouth daily.      . sertraline (ZOLOFT) 100 MG tablet Take 1.5 tablets (150 mg total) by mouth daily.  45 tablet  2   No current facility-administered  medications on file prior to visit.    Allergies  Allergen Reactions  . Metformin And Related     diarrhea  . Guaifenesin     MUCINEX REACTION: asthma attacks  .  Morphine And Related Other (See Comments)    Pt. States med makes her crazy  . Oxycodone-Acetaminophen     TYLOX.  . Guaifenesin Er     Family History  Problem Relation Age of Onset  . Lung cancer Father     DIED AGE 58 LUNG CA  . Alcohol abuse Father   . Anxiety disorder Father   . Depression Father   . COPD Mother     DIED AGE 77,MRSA,COPD,PNEUMONIA  . Pneumonia Mother     DIED AGE 77,MRSA,COPD,PNEUMONIA  . Supraventricular tachycardia Mother   . Anxiety disorder Mother   . Depression Mother   . Alcohol abuse Mother   . COPD Brother     AGE 10  . Heart disease Sister     DIED AGE 76, SMOKER,?HEART  . Colon cancer Neg Hx     BP 134/84  Pulse 90  Temp(Src) 98.2 F (36.8 C) (Oral)  Ht 5\' 5"  (1.651 m)  Wt 194 lb (87.998 kg)  BMI 32.28 kg/m2  SpO2 95%  Review of Systems She denies hypoglycemia and n/v.      Objective:   Physical Exam VITAL SIGNS:  See vs page GENERAL: no distress left thigh: no erythema/warmth/rash. Gait: normal, except she slightly favors LLE.         Assessment & Plan:  DM: mild exacerbation Thigh pain, possibly radicular.     Patient is advised the following: Patient Instructions  check your blood sugar 2 times a day.  vary the time of day when you check, between before the 3 meals, and at bedtime.  also check if you have symptoms of your blood sugar being too high or too low.  please keep a record of the readings and bring it to your next appointment here.  please call us sooner if your blood sugar goes below 70, or if you have a lot of readings over 200.  Please feel free to make comments on the record. Please increase "70/30" insulin to 90 units with breakfast, and 40 units with the evening meal.   On this type of insulin schedule, you should eat meals on a regular  schedule.  If a meal is missed or significantly delayed, your blood sugar could go low.  Please come back for a follow-up appointment in 3 months.   i am fine with you going ahead with your surgery.   Please continue the same pain medication.  Call dr Ronnald Ramp if the pain persists.

## 2013-11-13 NOTE — Patient Instructions (Addendum)
check your blood sugar 2 times a day.  vary the time of day when you check, between before the 3 meals, and at bedtime.  also check if you have symptoms of your blood sugar being too high or too low.  please keep a record of the readings and bring it to your next appointment here.  please call us sooner if your blood sugar goes below 70, or if you have a lot of readings over 200.  Please feel free to make comments on the record. Please increase "70/30" insulin to 90 units with breakfast, and 40 units with the evening meal.   On this type of insulin schedule, you should eat meals on a regular schedule.  If a meal is missed or significantly delayed, your blood sugar could go low.  Please come back for a follow-up appointment in 3 months.   i am fine with you going ahead with your surgery.   Please continue the same pain medication.  Call dr Ronnald Ramp if the pain persists.

## 2013-11-13 NOTE — Telephone Encounter (Signed)
Pts surgery on 11/21/13.

## 2013-11-15 ENCOUNTER — Ambulatory Visit
Admission: RE | Admit: 2013-11-15 | Discharge: 2013-11-15 | Disposition: A | Discharge status: Home / Self Care | Payer: Medicare Other | Source: Ambulatory Visit | Attending: Family Medicine | Admitting: Family Medicine

## 2013-11-15 ENCOUNTER — Other Ambulatory Visit: Payer: Self-pay | Admitting: *Deleted

## 2013-11-15 DIAGNOSIS — M5416 Radiculopathy, lumbar region: Secondary | ICD-10-CM

## 2013-11-16 ENCOUNTER — Telehealth: Payer: Self-pay | Admitting: Internal Medicine

## 2013-11-16 ENCOUNTER — Encounter (HOSPITAL_COMMUNITY): Payer: Self-pay | Admitting: Pharmacy Technician

## 2013-11-16 ENCOUNTER — Telehealth: Payer: Self-pay | Admitting: *Deleted

## 2013-11-16 NOTE — Telephone Encounter (Signed)
Noted  

## 2013-11-16 NOTE — Telephone Encounter (Signed)
Left msg on triage wanting to inform Dr. [name] she will not be able to have the epidural due to VVS surgery that she is going to have done on Monday with Dr. Vertell Limber...[name]

## 2013-11-16 NOTE — Telephone Encounter (Signed)
Spoke w/ pt. He reports she is going to have her 1st surgery done on 11/21/13-deep brain stimulation. FYI

## 2013-11-17 NOTE — Telephone Encounter (Signed)
Unfortunate but probably the best priority

## 2013-11-20 ENCOUNTER — Encounter: Payer: Self-pay | Admitting: Internal Medicine

## 2013-11-20 ENCOUNTER — Encounter (HOSPITAL_COMMUNITY): Payer: Self-pay

## 2013-11-20 ENCOUNTER — Ambulatory Visit (INDEPENDENT_AMBULATORY_CARE_PROVIDER_SITE_OTHER): Payer: Medicare Other | Admitting: Internal Medicine

## 2013-11-20 ENCOUNTER — Encounter (HOSPITAL_COMMUNITY)
Admission: RE | Admit: 2013-11-20 | Discharge: 2013-11-20 | Disposition: A | Discharge status: Home / Self Care | Payer: Medicare Other | Source: Ambulatory Visit | Attending: Neurosurgery | Admitting: Neurosurgery

## 2013-11-20 ENCOUNTER — Encounter (HOSPITAL_COMMUNITY)
Admission: RE | Admit: 2013-11-20 | Discharge: 2013-11-20 | Disposition: A | Discharge status: Home / Self Care | Payer: Medicare Other | Source: Ambulatory Visit | Attending: Anesthesiology | Admitting: Anesthesiology

## 2013-11-20 ENCOUNTER — Other Ambulatory Visit: Payer: Self-pay | Admitting: Neurosurgery

## 2013-11-20 VITALS — BP 130/74 | HR 93 | Temp 98.1°F | Resp 16 | Ht 64.5 in | Wt 192.0 lb

## 2013-11-20 DIAGNOSIS — G250 Essential tremor: Secondary | ICD-10-CM | POA: Diagnosis present

## 2013-11-20 DIAGNOSIS — M48061 Spinal stenosis, lumbar region without neurogenic claudication: Secondary | ICD-10-CM

## 2013-11-20 DIAGNOSIS — I10 Essential (primary) hypertension: Secondary | ICD-10-CM

## 2013-11-20 HISTORY — DX: Dysphagia, unspecified: R13.10

## 2013-11-20 HISTORY — DX: Weakness: R53.1

## 2013-11-20 HISTORY — DX: Hypothyroidism, unspecified: E03.9

## 2013-11-20 HISTORY — DX: Constipation, unspecified: K59.00

## 2013-11-20 HISTORY — DX: Personal history of other diseases of the respiratory system: Z87.09

## 2013-11-20 HISTORY — DX: Unspecified convulsions: R56.9

## 2013-11-20 HISTORY — DX: Pain in unspecified joint: M25.50

## 2013-11-20 HISTORY — DX: Effusion, unspecified joint: M25.40

## 2013-11-20 LAB — CBC
HCT: 41 % (ref 36.0–46.0)
Hemoglobin: 13.8 g/dL (ref 12.0–15.0)
MCH: 30.3 pg (ref 26.0–34.0)
MCHC: 33.7 g/dL (ref 30.0–36.0)
MCV: 90.1 fL (ref 78.0–100.0)
Platelets: 241 10*3/uL (ref 150–400)
RBC: 4.55 MIL/uL (ref 3.87–5.11)
RDW: 13.2 % (ref 11.5–15.5)
WBC: 10.3 10*3/uL (ref 4.0–10.5)

## 2013-11-20 LAB — BASIC METABOLIC PANEL
Anion gap: 11 (ref 5–15)
BUN: 9 mg/dL (ref 6–23)
CO2: 27 mEq/L (ref 19–32)
Calcium: 9.4 mg/dL (ref 8.4–10.5)
Chloride: 101 mEq/L (ref 96–112)
Creatinine, Ser: 0.74 mg/dL (ref 0.50–1.10)
GFR calc Af Amer: >90 mL/min (ref >90)
GFR calc non Af Amer: 87 mL/min — ABNORMAL LOW (ref >90)
Glucose, Bld: 52 mg/dL — ABNORMAL LOW (ref 70–99)
Potassium: 3.8 mEq/L (ref 3.7–5.3)
Sodium: 139 mEq/L (ref 137–147)

## 2013-11-20 MED ORDER — HYDROCODONE-ACETAMINOPHEN 5-325 MG PO TABS: 1.0000 | ORAL_TABLET | Freq: Four times a day (QID) | ORAL | Status: DC | PRN

## 2013-11-20 NOTE — Progress Notes (Signed)
Pre visit review using our clinic review tool, if applicable. No additional management support is needed unless otherwise documented below in the visit note. 

## 2013-11-20 NOTE — Pre-Procedure Instructions (Signed)
[name]  11/20/2013   Your procedure is scheduled on:  Tues, Aug 25 @ 7:30 AM  Report to [facility]  at 5:30 AM.  Call this number if you have problems the morning of surgery: [phone]   Remember:   Do not eat food or drink liquids after midnight.   Take these medicines the morning of surgery with A SIP OF WATER: Albuterol<Bring Your Inhaler With You>,Cymbalta(Duloxetine),Pain Pill(if needed),Synthroid(Levothyroxine),Ativan(Lorazepam),Metoprolol(Toprol),Nasonex(Mometasone-if needed),Mysoline(Primidone),Zantac(Ranitidine),and Zoloft(Sertraline)               Stop taking your Naproxen and Aspirin. No Goody's,BC's,Ibuprofen,Fish Oil,or any Herbal Medications   Do not wear jewelry, make-up or nail polish.  Do not wear lotions, powders, or perfumes. You may wear deodorant.  Do not shave 48 hours prior to surgery.   Do not bring valuables to the hospital.  [facility] is not responsible                  for any belongings or valuables.               Contacts, dentures or bridgework may not be worn into surgery.  Leave suitcase in the car. After surgery it may be brought to your room.  For patients admitted to the hospital, discharge time is determined by your                treatment team.               Patients discharged the day of surgery will not be allowed to drive  home.    Special Instructions:  [facility] - Preparing for Surgery  Before surgery, you can play an important role.  Because skin is not sterile, your skin needs to be as free of germs as possible.  You can reduce the number of germs on you skin by washing with CHG (chlorahexidine gluconate) soap before surgery.  CHG is an antiseptic cleaner which kills germs and bonds with the skin to continue killing germs even after washing.  Please DO NOT use if you have an allergy to CHG or antibacterial soaps.  If your skin becomes reddened/irritated stop using the CHG and inform your nurse when you arrive at Short  Stay.  Do not shave (including legs and underarms) for at least 48 hours prior to the first CHG shower.  You may shave your face.  Please follow these instructions carefully:   1.  Shower with CHG Soap the night before surgery and the                                morning of Surgery.  2.  If you choose to wash your hair, wash your hair first as usual with your       normal shampoo.  3.  After you shampoo, rinse your hair and body thoroughly to remove the                      Shampoo.  4.  Use CHG as you would any other liquid soap.  You can apply chg directly       to the skin and wash gently with scrungie or a clean washcloth.  5.  Apply the CHG Soap to your body ONLY FROM THE NECK DOWN.        Do not use on open wounds or open sores.  Avoid contact with your eyes,  ears, mouth and genitals (private parts).  Wash genitals (private parts)       with your normal soap.  6.  Wash thoroughly, paying special attention to the area where your surgery        will be performed.  7.  Thoroughly rinse your body with warm water from the neck down.  8.  DO NOT shower/wash with your normal soap after using and rinsing off       the CHG Soap.  9.  Pat yourself dry with a clean towel.            10.  Wear clean pajamas.            11.  Place clean sheets on your bed the night of your first shower and do not        sleep with pets.  Day of Surgery  Do not apply any lotions/deoderants the morning of surgery.  Please wear clean clothes to the hospital/surgery center.     Please read over the following fact sheets that you were given: Pain Booklet, Coughing and Deep Breathing and Surgical Site Infection Prevention

## 2013-11-20 NOTE — Progress Notes (Signed)
Requested orders for 12/08/13 surgery from [facility]

## 2013-11-20 NOTE — Progress Notes (Addendum)
Fasting blood sugar runs 150

## 2013-11-20 NOTE — Patient Instructions (Signed)
Back Pain, Adult Low back pain is very common. About 1 in 5 people have back pain.The cause of low back pain is rarely dangerous. The pain often gets better over time.About half of people with a sudden onset of back pain feel better in just 2 weeks. About 8 in 10 people feel better by 6 weeks.  CAUSES Some common causes of back pain include:  Strain of the muscles or ligaments supporting the spine.  Wear and tear (degeneration) of the spinal discs.  Arthritis.  Direct injury to the back. DIAGNOSIS Most of the time, the direct cause of low back pain is not known.However, back pain can be treated effectively even when the exact cause of the pain is unknown.Answering your caregiver's questions about your overall health and symptoms is one of the most accurate ways to make sure the cause of your pain is not dangerous. If your caregiver needs more information, he or she may order lab work or imaging tests (X-rays or MRIs).However, even if imaging tests show changes in your back, this usually does not require surgery. HOME CARE INSTRUCTIONS For many people, back pain returns.Since low back pain is rarely dangerous, it is often a condition that people can learn to manageon their own.   Remain active. It is stressful on the back to sit or stand in one place. Do not sit, drive, or stand in one place for more than 30 minutes at a time. Take short walks on level surfaces as soon as pain allows.Try to increase the length of time you walk each day.  Do not stay in bed.Resting more than 1 or 2 days can delay your recovery.  Do not avoid exercise or work.Your body is made to move.It is not dangerous to be active, even though your back may hurt.Your back will likely heal faster if you return to being active before your pain is gone.  Pay attention to your body when you bend and lift. Many people have less discomfortwhen lifting if they bend their knees, keep the load close to their bodies,and  avoid twisting. Often, the most comfortable positions are those that put less stress on your recovering back.  Find a comfortable position to sleep. Use a firm mattress and lie on your side with your knees slightly bent. If you lie on your back, put a pillow under your knees.  Only take over-the-counter or prescription medicines as directed by your caregiver. Over-the-counter medicines to reduce pain and inflammation are often the most helpful.Your caregiver may prescribe muscle relaxant drugs.These medicines help dull your pain so you can more quickly return to your normal activities and healthy exercise.  Put ice on the injured area.  Put ice in a plastic bag.  Place a towel between your skin and the bag.  Leave the ice on for 15-20 minutes, 03-04 times a day for the first 2 to 3 days. After that, ice and heat may be alternated to reduce pain and spasms.  Ask your caregiver about trying back exercises and gentle massage. This may be of some benefit.  Avoid feeling anxious or stressed.Stress increases muscle tension and can worsen back pain.It is important to recognize when you are anxious or stressed and learn ways to manage it.Exercise is a great option. SEEK MEDICAL CARE IF:  You have pain that is not relieved with rest or medicine.  You have pain that does not improve in 1 week.  You have new symptoms.  You are generally not feeling well. SEEK   IMMEDIATE MEDICAL CARE IF:   You have pain that radiates from your back into your legs.  You develop new bowel or bladder control problems.  You have unusual weakness or numbness in your arms or legs.  You develop nausea or vomiting.  You develop abdominal pain.  You feel faint. Document Released: 03/23/2005 Document Revised: 09/22/2011 Document Reviewed: 07/25/2013 ExitCare Patient Information 2015 [employer]. This information is not intended to replace advice given to you by your health care provider. Make sure you  discuss any questions you have with your health care provider.  

## 2013-11-20 NOTE — Progress Notes (Addendum)
  Cardiologist is with [facility] visit in 2014  Echo report in epic from 2009/2013  Stress test in epic from 2009  EKG in epic from 04-05-13  Denies ever having a heart cath  Denies CXR in past yr  Medical Md is Dr.Thomas Ronnald Ramp

## 2013-11-21 ENCOUNTER — Encounter (HOSPITAL_COMMUNITY): Payer: Self-pay | Admitting: Anesthesiology

## 2013-11-21 ENCOUNTER — Encounter (HOSPITAL_COMMUNITY): Payer: Self-pay | Admitting: Emergency Medicine

## 2013-11-21 ENCOUNTER — Ambulatory Visit (HOSPITAL_COMMUNITY)
Admission: RE | Admit: 2013-11-21 | Discharge: 2013-11-21 | Disposition: A | Discharge status: Home / Self Care | Payer: Medicare Other | Source: Ambulatory Visit | Attending: Neurosurgery | Admitting: Neurosurgery

## 2013-11-21 DIAGNOSIS — G250 Essential tremor: Secondary | ICD-10-CM | POA: Insufficient documentation

## 2013-11-21 DIAGNOSIS — G252 Other specified forms of tremor: Principal | ICD-10-CM

## 2013-11-21 NOTE — Assessment & Plan Note (Signed)
Her BP is well controlled 

## 2013-11-21 NOTE — Progress Notes (Signed)
Subjective:    Patient ID: [name], female    DOB: Jan 03, 1951, 63 y.o.   MRN: [id number]  Back Pain This is a recurrent problem. The current episode started more than 1 year ago. The problem has been gradually worsening since onset. The pain is present in the lumbar spine. The quality of the pain is described as stabbing. The pain radiates to the left thigh. The pain is at a severity of 6/10. The pain is moderate. The pain is worse during the day. The symptoms are aggravated by bending and position. Pertinent negatives include no abdominal pain, bladder incontinence, bowel incontinence, chest pain, dysuria, fever, headaches, leg pain, numbness, paresis, paresthesias, pelvic pain, perianal numbness, tingling, weakness or weight loss. She has tried analgesics for the symptoms. The treatment provided moderate relief.      Review of Systems  Constitutional: Negative.  Negative for fever, chills, weight loss, diaphoresis, appetite change and fatigue.  HENT: Negative.   Eyes: Negative.   Respiratory: Negative.  Negative for cough, choking, chest tightness, shortness of breath and stridor.   Cardiovascular: Negative.  Negative for chest pain, palpitations and leg swelling.  Gastrointestinal: Negative.  Negative for nausea, vomiting, abdominal pain, diarrhea, constipation, blood in stool and bowel incontinence.  Endocrine: Negative.   Genitourinary: Negative.  Negative for bladder incontinence, dysuria and pelvic pain.  Musculoskeletal: Positive for back pain. Negative for arthralgias, myalgias and neck pain.  Skin: Negative for rash.  Allergic/Immunologic: Negative.   Neurological: Positive for tremors. Negative for dizziness, tingling, weakness, numbness, headaches and paresthesias.  Hematological: Negative.  Negative for adenopathy. Does not bruise/bleed easily.  Psychiatric/Behavioral: Negative.        Objective:   Physical Exam  Vitals reviewed. Constitutional: She is oriented to  person, place, and time. She appears well-developed and well-nourished. No distress.  HENT:  Head: Normocephalic and atraumatic.  Mouth/Throat: Oropharynx is clear and moist. No oropharyngeal exudate.  Eyes: Conjunctivae are normal. Right eye exhibits no discharge. Left eye exhibits no discharge. No scleral icterus.  Neck: Normal range of motion. Neck supple. No JVD present. No tracheal deviation present. No thyromegaly present.  Cardiovascular: Normal rate, regular rhythm, normal heart sounds and intact distal pulses.  Exam reveals no gallop and no friction rub.   No murmur heard. Pulmonary/Chest: Effort normal and breath sounds normal. No stridor. No respiratory distress. She has no wheezes. She has no rales. She exhibits no tenderness.  Abdominal: Soft. Bowel sounds are normal. She exhibits no distension and no mass. There is no tenderness. There is no rebound and no guarding.  Musculoskeletal: Normal range of motion. She exhibits no edema and no tenderness.  Lymphadenopathy:    She has no cervical adenopathy.  Neurological: She is alert and oriented to person, place, and time. She has normal strength. She displays tremor. She displays no atrophy and normal reflexes. No cranial nerve deficit or sensory deficit. She exhibits normal muscle tone. She displays a negative Romberg sign. Coordination and gait normal.  Reflex Scores:      Tricep reflexes are 1+ on the right side and 1+ on the left side.      Bicep reflexes are 1+ on the right side and 1+ on the left side.      Brachioradialis reflexes are 1+ on the right side and 1+ on the left side.      Patellar reflexes are 1+ on the right side and 1+ on the left side.      Achilles reflexes are  1+ on the right side and 1+ on the left side. Neg SLR in BLE  Skin: Skin is warm and dry. No rash noted. She is not diaphoretic. No erythema. No pallor.          Assessment & Plan:

## 2013-11-21 NOTE — Assessment & Plan Note (Signed)
She will cont norco as needed for pain She wants to see if ESI will help so I have referred her to pain management

## 2013-11-22 NOTE — Progress Notes (Signed)
Anesthesia Chart Review:  Patient is a 63 year old female scheduled for left deep brain stimulator placement on 11/28/13 by Dr. Vertell Limber.  History includes former smoker, AV nodal re-entry tachycardia s/p ablation '09 (Dr. Rayann Heman), HTN, DM2, anxiety, depression, dysphagia, vocal cord dysfunction, essential tremor, DDD, chronic back pain, low sodium syndrome, hypothyroidism, asthma, GERD, question of seizure 04/05/13 in the setting of Bartholin's abscess s/p I&D with medication non-compliance (ran out of insulin and Primidone) and presented to the ED and was admitted for DKA and marked hypertension. PCP is Dr. Scarlette Calico, last visit 11/20/13. HR was 93 bpm at that appointment. Endocrinologist is Dr. Dwyane Dee. Her last visit with cardiology was with Dr. Bronson Ing on 03/17/13 with no testing ordered at that visit.  Meds include Novolin 70/30, Toprol, primidone, Zantac, Zoloft, losartan, Synthroid, ASA, ProAir, Ativan, Cymbalta, Neurontin.  EKG on 04/05/13 showed ST at 122 bpm (EKG done during ED evaluation for Bartholins abscess ).  HR on 11/20/13 was 93 bpm. BP 130/74. She in on b-blocker therapy which she was instructed to take on the morning of surgery.  Echo on 07/16/11 showed: - Left ventricle: The cavity size was normal. Wall thickness was increased in a pattern of moderate LVH. The estimated ejection fraction was 65%. Wall motion was normal; there were no regional wall motion abnormalities. - Right ventricle: The cavity size was mildly dilated. Systolic function was normal.  Nuclear stress test on 09/05/07 showed: Normal stress nuclear myocardial study revealing impaired exercise capacity, no stress - induced EKG abnormalities, normal left ventricular size, normal left ventricular systolic function and normal myocardial perfusion.   CXR on 11/20/13 showed: Elevation of the right hemidiaphragm. Right basilar atelectasis or infiltrate. No pulmonary edema. (She was actually evaluated by Dr. Scarlette Calico on the  day of this CXR with negative respiratory ROS for cough, chest tightness, SOB, stridor. Negative F/C, diaphoresis, chest pain. O2 sat 95-98%.  Base on lack of symptoms, I would anticipate that CXR findings are more related to atelectasis associated with her right hemidiaphragm elevation. Because the right hemidiaphragm finding appeared new, I routed report to Dr. Scarlette Calico and called the report to Janett Billow at Dr. Melven Sartorius office.)  Preoperative labs noted.  Glucose was 52.  She will get a fasting CBG on arrival.    Further evaluation by her assigned anesthesiologist on the day of surgery.  If no acute cardiopulmonary issues then I would anticipate that she could proceed as planned.  George Hugh [facility] Short Stay Center/Anesthesiology Phone [phone] 11/22/2013 11:59 AM

## 2013-11-23 ENCOUNTER — Inpatient Hospital Stay: Payer: MEDICARE

## 2013-11-23 MED ORDER — LIDOCAINE (PF) 10 MG/ML (1 %) IJ SOLN
10 mg/mL (1 %) | INTRAMUSCULAR | Status: DC | PRN
Start: 2013-11-23 — End: 2013-11-23

## 2013-11-23 MED ORDER — ONDANSETRON (PF) 4 MG/2 ML INJECTION
4 mg/2 mL | INTRAMUSCULAR | Status: DC | PRN
Start: 2013-11-23 — End: 2013-11-23

## 2013-11-23 MED ORDER — BUPIVACAINE (PF) 0.125%  400 ML LOCAL INFILTRATION, ELAS PUMP
Status: DC
Start: 2013-11-23 — End: 2013-11-23
  Administered 2013-11-23: 14:00:00

## 2013-11-23 MED ORDER — HYDROMORPHONE (PF) 1 MG/ML IJ SOLN
1 mg/mL | INTRAMUSCULAR | Status: DC | PRN
Start: 2013-11-23 — End: 2013-11-23

## 2013-11-23 MED ADMIN — fentaNYL citrate (PF) injection: INTRAVENOUS | @ 11:00:00 | NDC 10019003727

## 2013-11-23 MED ADMIN — fentaNYL citrate (PF) injection: INTRAVENOUS | @ 12:00:00 | NDC 10019003727

## 2013-11-23 MED ADMIN — midazolam (VERSED) injection: INTRAVENOUS | @ 11:00:00 | NDC 00409230517

## 2013-11-23 MED ADMIN — bupivacaine-EPINEPHrine (PF) 0.5 %-1:200,000 50 mL, lidocaine-EPINEPHrine 1 %-1:100,000 20 mL solution: @ 13:00:00 | NDC 00409904502

## 2013-11-23 MED ADMIN — lactated ringers infusion: INTRAVENOUS | @ 11:00:00 | NDC 00409795309

## 2013-11-23 MED ADMIN — fentaNYL citrate (PF) injection: INTRAVENOUS | @ 12:00:00 | NDC 68258301001

## 2013-11-23 MED ADMIN — clindamycin (CLEOCIN) 600mg D5W 50mL IVPB (premix): INTRAVENOUS | @ 12:00:00 | NDC 47781062094

## 2013-11-23 MED ADMIN — EPINEPHrine HCl (PF) 1 mL in lactated ringers 3,000 mL Irrigation: @ 13:00:00 | NDC 00409795309

## 2013-11-23 MED ADMIN — propofol (DIPRIVAN) 10 mg/mL injection: INTRAVENOUS | @ 12:00:00 | NDC 63323026920

## 2013-11-23 MED ADMIN — dexamethasone (DECADRON) 4 mg/mL injection: INTRAVENOUS | @ 13:00:00 | NDC 63323016501

## 2013-11-23 MED ADMIN — ropivacaine (PF) (NAROPIN) 5 mg/mL (0.5 %) injection: PERINEURAL | @ 11:00:00 | NDC 00074575701

## 2013-11-23 MED ADMIN — ropivacaine (PF) (NAROPIN) 5 mg/mL (0.5 %) injection: EPIDURAL | @ 11:00:00 | NDC 63323028630

## 2013-11-23 MED ADMIN — fentaNYL citrate (PF) injection: INTRAVENOUS | @ 13:00:00 | NDC 10019003727

## 2013-11-23 MED ADMIN — ondansetron (ZOFRAN) injection: INTRAVENOUS | @ 13:00:00 | NDC 00641607825

## 2013-11-23 MED ADMIN — midazolam (VERSED) injection: INTRAVENOUS | @ 12:00:00 | NDC 66758001802

## 2013-11-23 MED ADMIN — lactated ringers infusion: INTRAVENOUS | @ 13:00:00 | NDC 00338011704

## 2013-11-23 MED ADMIN — midazolam (VERSED) injection: INTRAVENOUS | @ 12:00:00 | NDC 00409230517

## 2013-11-23 MED ADMIN — dexamethasone (DECADRON) 4 mg/mL injection: INTRAVENOUS | @ 13:00:00 | NDC 00779755160

## 2013-11-23 MED FILL — LIDOCAINE-EPINEPHRINE 1 %-1:100,000 IJ SOLN: 1 %-:00,000 | INTRAMUSCULAR | Qty: 20

## 2013-11-23 MED FILL — SUCCINYLCHOLINE CHLORIDE 100 MG/5 ML (20 MG/ML) IV SYRINGE: 100 mg/5 mL (20 mg/mL) | INTRAVENOUS | Qty: 5

## 2013-11-23 MED FILL — CLINDAMYCIN PHOSPHATE 150 MG/ML IJ SOLN: 150 mg/mL | INTRAMUSCULAR | Qty: 4

## 2013-11-23 MED FILL — MIDAZOLAM 1 MG/ML IJ SOLN: 1 mg/mL | INTRAMUSCULAR | Qty: 5

## 2013-11-23 MED FILL — FENTANYL CITRATE (PF) 50 MCG/ML IJ SOLN: 50 mcg/mL | INTRAMUSCULAR | Qty: 5

## 2013-11-23 MED FILL — LACTATED RINGERS IV: INTRAVENOUS | Qty: 1000

## 2013-11-23 MED FILL — LIDOCAINE (PF) 20 MG/ML (2 %) IV SYRINGE: 100 mg/5 mL (2 %) | INTRAVENOUS | Qty: 5

## 2013-11-23 MED FILL — BUPIVACAINE-EPINEPHRINE (PF) 0.5 %-1:200,000 IJ SOLN: 0.5 %-1:200,000 | INTRAMUSCULAR | Qty: 30

## 2013-11-23 MED FILL — ONDANSETRON (PF) 4 MG/2 ML INJECTION: 4 mg/2 mL | INTRAMUSCULAR | Qty: 2

## 2013-11-23 MED FILL — BD POSIFLUSH NORMAL SALINE 0.9 % INJECTION SYRINGE: INTRAMUSCULAR | Qty: 10

## 2013-11-23 MED FILL — BUPIVACAINE (PF) 0.125%  400 ML LOCAL INFILTRATION, ELAS PUMP: Qty: 400

## 2013-11-23 MED FILL — DIPRIVAN 10 MG/ML INTRAVENOUS EMULSION: 10 mg/mL | INTRAVENOUS | Qty: 20

## 2013-11-23 MED FILL — ARTIFICIAL TEARS (PETROLATUM/MINERAL OIL) 83 %-15 % EYE OINTMENT: 83-15 % | OPHTHALMIC | Qty: 3.5

## 2013-11-23 MED FILL — EPINEPHRINE 1 MG/ML IJ SOLN: 1 mg/mL | INTRAMUSCULAR | Qty: 30

## 2013-11-23 MED FILL — ROCURONIUM 10 MG/ML IV: 10 mg/mL | INTRAVENOUS | Qty: 5

## 2013-11-23 MED FILL — DIPRIVAN 10 MG/ML INTRAVENOUS EMULSION: 10 mg/mL | INTRAVENOUS | Qty: 50

## 2013-11-23 MED FILL — DEXAMETHASONE SODIUM PHOSPHATE 4 MG/ML IJ SOLN: 4 mg/mL | INTRAMUSCULAR | Qty: 1

## 2013-11-23 MED FILL — DEXAMETHASONE SODIUM PHOSPHATE 4 MG/ML IJ SOLN: 4 mg/mL | INTRAMUSCULAR | Qty: 2

## 2013-11-23 NOTE — Anesthesia Pre-Procedure Evaluation (Signed)
Anesthetic History   No history of anesthetic complications            Review of Systems / Medical History  Patient summary reviewed and pertinent labs reviewed    Pulmonary          Smoker         Neuro/Psych   Within defined limits           Cardiovascular  Within defined limits                Exercise tolerance: >4 METS     GI/Hepatic/Renal               Comments: IBS Endo/Other  Within defined limits           Other Findings              Physical Exam    Airway  Mallampati: I  TM Distance: 4 - 6 cm  Neck ROM: normal range of motion   Mouth opening: Normal     Cardiovascular    Rhythm: regular  Rate: normal         Dental  No notable dental hx       Pulmonary  Breath sounds clear to auscultation               Abdominal         Other Findings            Anesthetic Plan    ASA: 2  Anesthesia type: general      Post-op pain plan if not by surgeon: peripheral nerve block continuous      Anesthetic plan and risks discussed with: Patient

## 2013-11-23 NOTE — Anesthesia Post-Procedure Evaluation (Signed)
Post-Anesthesia Evaluation and Assessment    Patient: Diane Riggs MRN: [id number]  SSN: [id number]    Date of Birth: Sep 02, 1950  Age: 63 y.o.  Sex: female       Cardiovascular Function/Vital Signs  Visit Vitals   Item Reading   ??? BP 144/67 mmHg   ??? Pulse 78   ??? Temp 36.7 ??C (98 ??F)   ??? Resp 21   ??? Ht 5\' 3"  (1.6 m)   ??? Wt 40.115 kg (88 lb 7 oz)   ??? BMI 15.67 kg/m2   ??? SpO2 96%       Patient is status post general, regional anesthesia for Procedure(s):  LEFT SHOULDER ARTHROSCOPY, AC JOINT RESECTION, DEBRIDEMENT LONG HEAD OF BICEP STUMP, ROTATOR CUFF REPAIR(SCALENE W/ON-Q).    Nausea/Vomiting: None    Postoperative hydration reviewed and adequate.    Pain:  Pain Scale 1: Numeric (0 - 10) (taking Percocet; using Active Ice.) (11/24/13 1433)  Pain Intensity 1: 0 (11/24/13 1433)   Managed    Neurological Status:   Neuro (WDL): Within Defined Limits (11/23/13 1005)  Neuro  Neurologic State: Alert;Appropriate for age (11/23/13 1005)  LUE Motor Response: Weak;No movement to any stimulus;Pharmacologically paralyzed;Numbness (11/23/13 1005)   At baseline    Mental Status and Level of Consciousness: Alert and oriented     Pulmonary Status:   O2 Device: Room air (11/23/13 0935)   Adequate oxygenation and airway patent    Complications related to anesthesia: None    Post-anesthesia assessment completed. No concerns    Signed By: Hayden RasmussenWilliam C Jarrin Staley, MD     November 25, 2013

## 2013-11-23 NOTE — Progress Notes (Signed)
POST ANESTHESIA CARE DISCHARGE NOTE    Diane Riggs was   discharged     via   wheel chair     To private vehicle    .     Patient was escorted by   volunteer     .   Patient verbalized   appreciation and was very pleased with care received throughout their stay.  Patient was discharged in   pleasant mood   .   Pain at discharge/transfer was    0 /10.    Discharge, medication and follow-up instructions were verbalized as understood prior to discharge.    All personal belongings have been returned to patient, and patient/family verbally confirm receiving belongings as all present.    Ricci Barker BSN RN-BC

## 2013-11-23 NOTE — Brief Op Note (Signed)
BRIEF OPERATIVE NOTE    Date of Procedure: 11/23/2013   Preoperative Diagnosis: ROTATOR CUFF TEAR, AC JOINT ARTHRITIS; IMPINGEMENT; LONG HEAD BICEP TEAR  Postoperative Diagnosis: ROTATOR CUFF TEAR, AC JOINT ARTHRITIS; IMPINGEMENT; LONG HEAD BICEP TEAR    Procedure(s):  LEFT SHOULDER ARTHROSCOPY, AC JOINT RESECTION, DEBRIDEMENT LONG HEAD OF BICEP STUMP, ROTATOR CUFF REPAIR(SCALENE W/ON-Q)  Surgeon(s) and Role:     * Quentin Ore, MD - Primary       Newt Lukes- PAC Asst  Anesthesia: Other   Estimated Blood Loss: less than 10cc  Specimens: * No specimens in log *   Findings: 1 tendon RC tear and LHB tear  Complications: none  Implants:   Implant Name Type Inv. Item Serial No. Manufacturer Lot No. LRB No. Used Action   DEPUY 5.5MM HEALIX ADVANCE KNOTLESS PEEK ANCHOR   N/A [employer] [id number] Left 1 Implanted   ANCHOR SUT ADV KNTLSS PK 4.75 -- HEALIX - SN/A  ANCHOR SUT ADV KNTLSS PK 4.75 -- HEALIX N/A [employer] MITEK [id number] Left 1 Implanted   ANCHOR SUT ROT CUF KNTLS MAG -- OPUS MAGNUM - SN/A   ANCHOR SUT ROT CUF KNTLS MAG -- OPUS MAGNUM N/A [employer] [id number] Left 1 Implanted

## 2013-11-23 NOTE — Anesthesia Procedure Notes (Addendum)
Peripheral Block    Start time: 11/23/2013 6:55 AM  End time: 11/23/2013 7:10 AM  Block Type: left interscalene  Reason for block: at surgeon's request and post-op pain management  Staffing  Anesthesiologist: Quy Lotts CHRISTIAN  Resident/CRNA: BROOKS, JENNIFER M  Performed by: anesthesiologist   Prep  Risks and benefits discussed with the patient and plans are to proceed  Site marked, Timeout performed, 06:55  Monitoring: continuous pulse ox, frequent vital sign checks, heart rate, responsive to questions and oxygen  Injection Technique: continuous  Procedures: ultrasound guided  Patient was placed in supine position  Prep Solution(s): chlorhexidine  Region: interscalene  Needle  Needle: 18G Tuohy  Needle localization: nerve stimulator and ultrasound guidance  Minimal motor response <0.5 mA and >0.3 mA  Medication Injected: 30mL 0.5% ropivacaine    Assessment  Injection Assessment: incremental injection every 5 mL, local visualized surrounding nerve on ultrasound, negative aspiration for blood and no intravascular symptoms  Patient tolerated without any apparent complications    11/23/2013  8:31 AM  I have reviewed and agree with all aforementioned block procedure note documentation.   Shira Bobst C Marleena Shubert, MD

## 2013-11-23 NOTE — Other (Signed)
PACU IN REPORT FROM ANESTHESIA    Verbal report received from [facility]   CRNA   +SRNA    following Other for Procedure(s) (LRB):  LEFT SHOULDER ARTHROSCOPY, AC JOINT RESECTION, DEBRIDEMENT LONG HEAD OF BICEP STUMP, ROTATOR CUFF REPAIR(SCALENE W/ON-Q) (Left).    Note the anesthesia record for medications given intraoperatively.    Brief Initial Visual Assessment:    Airway is:   Patent    Respiratory pattern is:    Even, Non-labored.   Patient is:  alert and oriented X 3 (Person, Place and Time.)    Skin is:   Pink, Warm and Dry.     Membranes are:    Pink and Moist.    Patient is in:    No Acute Discomfort.    0 /10 pain using  Verbal Numeric Scale.    - Note E-MAR for medications administered.    Note assessments documented in flowsheets;any assessment variants to be found in comments or narrative perioperative nurse notes.       Post-anesthesia care now assumed, record signed by Ricci Barker BSN, RN-BC

## 2013-11-23 NOTE — Op Note (Signed)
Name:      [place]wood, [place] K                                          Surgeon:        Iantha Fallen, MD  Account #: [id number]                 Surgery Date:   11/23/2013  DOB:       1950/12/28  Age:       63                           Location:                                 OPERATIVE REPORT      PREOPERATIVE DIAGNOSES:  1. Left shoulder rotator cuff tear.  2. Acromioclavicular joint impingement.  3. Subacromial impingement.    POSTOPERATIVE DIAGNOSES:  1. Left shoulder rotator cuff tear.  2. Acromioclavicular joint impingement.  3. Subacromial impingement.  4. Tear of long head of biceps with large stump in the center of the joint.      PROCEDURES PERFORMED:  1. Left shoulder arthroscopic subacromial decompression.  2. Arthroscopic acromioclavicular joint resection.  3. Arthroscopic rotator cuff repair.  4. Arthroscopic debridement of long head biceps tear.    SURGEON: Quentin Ore, MD    ASSISTANT: Newt Lukes, PA-C    ANESTHESIA: Scalene block and general.    ESTIMATED BLOOD LOSS: Less than 20 mL.    COMPLICATIONS: None.    INDICATIONS: The patient is a woman who had a left shoulder injury,  underwent physical therapy and injection for rotator cuff pain and tear and  ultimately an MRI showed a clear tear along with a full-thickness tear of  long head of the biceps. The patient, after failure of conservative  management, agreed to move forward with arthroscopic repair as well as AC  joint resection and subacromial decompression.    DESCRIPTION OF PROCEDURE: The patient was brought to the sitting  beach-chair-position after scalene anesthesia was achieved and general  anesthesia was achieved as well and exam under anesthesia revealed no  evidence of instability or laxity or contracture.    After prepping and draping, the scope was placed in posterior portal, the  glenohumeral joint evaluated. The anterior-inferior posterior labrum were   intact. The middle of the joint was taken up with the stump of the biceps  which was ripped in the middle of the biceps tendon, instead of its origin  on the superior labrum. We therefore cut it from the superior labrum and  then excised this piece that was stuck in the joint. The patient's  subscapularis had very minimal, less than 10% undersurface tearing. This  was just trimmed with electrocautery. The patient's supraspinatus had a  full-thickness tear with no retraction. We debrided the greater tuberosity  footprint and then prepared that for repair.    The scope was taken out of the glenohumeral joint, brought to the  subacromial space and first we performed limited bursectomy, followed by  acromioplasty. The [facility] joint was markedly narrowed and through an anterior  portal, we took away 2 mm of the lateral acromion and 4-5 mm of the lateral  clavicle so that was 2-3 mm  of medial acromion and then 4-5 mm of the  lateral clavicle to get a 1 cm AC joint space. Having completed that, we  easily mobilized the tendon back to its anatomic footprint. We placed 3  incline mattress sutures, 2 were brought down to a DePuy Mitek screw and  anchor and one was brought to an Opus metallic rotator cuff anchor. Having  completed repair, we had good restitution of the anatomic footprint when  viewed from the subacromial space.    We irrigated copiously, closed the wounds. The patient went to the recovery  room in good condition. There were no complications.          Iantha Fallen, MD    cc:   Iantha Fallen, MD        KRZ/wmx; D: 11/23/2013 09:26 A; T: 11/23/2013 09:45 A; Doc# [id number]; Job#  [id number]

## 2013-11-23 NOTE — Anesthesia Procedure Notes (Signed)
Peripheral Block    Start time: 11/23/2013 6:55 AM  End time: 11/23/2013 7:10 AM  Block Type: left interscalene  Reason for block: at surgeon's request and post-op pain management  Staffing  Anesthesiologist: Aris Everts CHRISTIAN  Resident/CRNA: Crawford Givens  Performed by: anesthesiologist   Prep  Risks and benefits discussed with the patient and plans are to proceed  Site marked, Timeout performed, 06:55  Monitoring: continuous pulse ox, frequent vital sign checks, heart rate, responsive to questions and oxygen  Injection Technique: continuous  Procedures: ultrasound guided  Patient was placed in supine position  Prep Solution(s): chlorhexidine  Region: interscalene  Needle  Needle: 18G Tuohy  Needle localization: nerve stimulator and ultrasound guidance  Minimal motor response <0.5 mA and >0.3 mA  Medication Injected: 30mL 0.5% ropivacaine    Assessment  Injection Assessment: incremental injection every 5 mL, local visualized surrounding nerve on ultrasound, negative aspiration for blood and no intravascular symptoms  Patient tolerated without any apparent complications    11/23/2013  8:31 AM  I have reviewed and agree with all aforementioned block procedure note documentation.   Hayden Rasmussen, MD

## 2013-11-23 NOTE — Op Note (Signed)
Name:      Diane Riggs, Diane Riggs                                          Surgeon:        Iantha FallenKenneth R Jantz Main, MD  Account #: [id number]                 Surgery Date:   11/23/2013  DOB:       20-Apr-1950  Age:       63                           Location:                                 OPERATIVE REPORT      PREOPERATIVE DIAGNOSES:  1. Left shoulder rotator cuff tear.  2. Acromioclavicular joint impingement.  3. Subacromial impingement.    POSTOPERATIVE DIAGNOSES:  1. Left shoulder rotator cuff tear.  2. Acromioclavicular joint impingement.  3. Subacromial impingement.  4. Tear of long head of biceps with large stump in the center of the joint.      PROCEDURES PERFORMED:  1. Left shoulder arthroscopic subacromial decompression.  2. Arthroscopic acromioclavicular joint resection.  3. Arthroscopic rotator cuff repair.  4. Arthroscopic debridement of long head biceps tear.    SURGEON: Quentin OreKenneth Marva Hendryx, MD    ASSISTANT: Newt LukesStephanie Burkhardt, PA-C    ANESTHESIA: Scalene block and general.    ESTIMATED BLOOD LOSS: Less than 20 mL.    COMPLICATIONS: None.    INDICATIONS: The patient is a woman who had a left shoulder injury,  underwent physical therapy and injection for rotator cuff pain and tear and  ultimately an MRI showed a clear tear along with a full-thickness tear of  long head of the biceps. The patient, after failure of conservative  management, agreed to move forward with arthroscopic repair as well as AC  joint resection and subacromial decompression.    DESCRIPTION OF PROCEDURE: The patient was brought to the sitting  beach-chair-position after scalene anesthesia was achieved and general  anesthesia was achieved as well and exam under anesthesia revealed no  evidence of instability or laxity or contracture.    After prepping and draping, the scope was placed in posterior portal, the  glenohumeral joint evaluated. The anterior-inferior posterior labrum were  intact. The middle of the joint was taken up with the stump of  the biceps  which was ripped in the middle of the biceps tendon, instead of its origin  on the superior labrum. We therefore cut it from the superior labrum and  then excised this piece that was stuck in the joint. The patient's  subscapularis had very minimal, less than 10% undersurface tearing. This  was just trimmed with electrocautery. The patient's supraspinatus had a  full-thickness tear with no retraction. We debrided the greater tuberosity  footprint and then prepared that for repair.    The scope was taken out of the glenohumeral joint, brought to the  subacromial space and first we performed limited bursectomy, followed by  acromioplasty. [facility] and through an anterior  portal, we took away 2 mm of the lateral acromion and 4-5 mm of the lateral  clavicle so that was 2-3 mm  of medial acromion and then 4-5 mm of the  lateral clavicle to get a 1 cm AC joint space. Having completed that, we  easily mobilized the tendon back to its anatomic footprint. We placed 3  incline mattress sutures, 2 were brought down to a DePuy Mitek screw and  anchor and one was brought to an Opus metallic rotator cuff anchor. Having  completed repair, we had good restitution of the anatomic footprint when  viewed from the subacromial space.    We irrigated copiously, closed the wounds. The patient went to the recovery  room in good condition. There were no complications.          Iantha Fallen, MD    cc:   Iantha Fallen, MD        KRZ/wmx; D: 11/23/2013 09:26 A; T: 11/23/2013 09:45 A; Doc# [id number]; Job#  [id number]

## 2013-11-24 ENCOUNTER — Encounter (HOSPITAL_COMMUNITY): Payer: Self-pay | Admitting: Pharmacy Technician

## 2013-11-25 MED FILL — NAROPIN (PF) 5 MG/ML (0.5 %) INJECTION SOLUTION: 5 mg/mL (0. %) | INTRAMUSCULAR | Qty: 30

## 2013-11-25 MED FILL — DIPRIVAN 10 MG/ML INTRAVENOUS EMULSION: 10 mg/mL | INTRAVENOUS | Qty: 150

## 2013-11-25 MED FILL — DEXAMETHASONE SODIUM PHOSPHATE 4 MG/ML IJ SOLN: 4 mg/mL | INTRAMUSCULAR | Qty: 8

## 2013-11-25 MED FILL — ONDANSETRON (PF) 4 MG/2 ML INJECTION: 4 mg/2 mL | INTRAMUSCULAR | Qty: 4

## 2013-11-27 ENCOUNTER — Other Ambulatory Visit: Payer: Self-pay | Admitting: Neurology

## 2013-11-27 DIAGNOSIS — G250 Essential tremor: Secondary | ICD-10-CM

## 2013-11-27 MED ORDER — CEFAZOLIN SODIUM-DEXTROSE 2-3 GM-% IV SOLR: 2.0000 g | INTRAVENOUS | Status: DC

## 2013-11-28 ENCOUNTER — Encounter (HOSPITAL_COMMUNITY): Payer: Self-pay

## 2013-11-28 ENCOUNTER — Ambulatory Visit (HOSPITAL_COMMUNITY): Admission: RE | Admit: 2013-11-28 | Payer: Medicare Other | Source: Ambulatory Visit | Admitting: Neurosurgery

## 2013-11-28 ENCOUNTER — Encounter (HOSPITAL_COMMUNITY): Admission: RE | Payer: Self-pay | Source: Ambulatory Visit

## 2013-11-28 SURGERY — SUBTHALAMIC STIMULATOR INSERTION
Anesthesia: General | Laterality: Left

## 2013-12-03 MED ORDER — CEFAZOLIN SODIUM-DEXTROSE 2-3 GM-% IV SOLR: 2.0000 g | INTRAVENOUS | Status: DC

## 2013-12-04 ENCOUNTER — Inpatient Hospital Stay (HOSPITAL_COMMUNITY): Payer: Medicare Other | Admitting: Anesthesiology

## 2013-12-04 ENCOUNTER — Encounter (HOSPITAL_COMMUNITY): Payer: Medicare Other | Admitting: Anesthesiology

## 2013-12-04 ENCOUNTER — Encounter (HOSPITAL_COMMUNITY)
Admission: RE | Disposition: A | Discharge status: Home / Self Care | Payer: Self-pay | Source: Ambulatory Visit | Attending: Neurosurgery

## 2013-12-04 ENCOUNTER — Inpatient Hospital Stay (HOSPITAL_COMMUNITY)
Admission: RE | Admit: 2013-12-04 | Discharge: 2013-12-05 | DRG: 027 | Disposition: A | Discharge status: Home / Self Care | Payer: Medicare Other | Source: Ambulatory Visit | Attending: Neurosurgery | Admitting: Neurosurgery

## 2013-12-04 ENCOUNTER — Encounter (HOSPITAL_COMMUNITY): Payer: Self-pay | Admitting: Anesthesiology

## 2013-12-04 ENCOUNTER — Other Ambulatory Visit: Payer: Self-pay | Admitting: Neurology

## 2013-12-04 DIAGNOSIS — J45909 Unspecified asthma, uncomplicated: Secondary | ICD-10-CM | POA: Diagnosis present

## 2013-12-04 DIAGNOSIS — F3289 Other specified depressive episodes: Secondary | ICD-10-CM | POA: Diagnosis present

## 2013-12-04 DIAGNOSIS — Z794 Long term (current) use of insulin: Secondary | ICD-10-CM

## 2013-12-04 DIAGNOSIS — M129 Arthropathy, unspecified: Secondary | ICD-10-CM | POA: Diagnosis present

## 2013-12-04 DIAGNOSIS — E079 Disorder of thyroid, unspecified: Secondary | ICD-10-CM | POA: Diagnosis present

## 2013-12-04 DIAGNOSIS — E119 Type 2 diabetes mellitus without complications: Secondary | ICD-10-CM | POA: Diagnosis present

## 2013-12-04 DIAGNOSIS — Z87891 Personal history of nicotine dependence: Secondary | ICD-10-CM | POA: Diagnosis not present

## 2013-12-04 DIAGNOSIS — G250 Essential tremor: Secondary | ICD-10-CM | POA: Diagnosis present

## 2013-12-04 DIAGNOSIS — K219 Gastro-esophageal reflux disease without esophagitis: Secondary | ICD-10-CM | POA: Diagnosis present

## 2013-12-04 DIAGNOSIS — F411 Generalized anxiety disorder: Secondary | ICD-10-CM | POA: Diagnosis present

## 2013-12-04 DIAGNOSIS — F329 Major depressive disorder, single episode, unspecified: Secondary | ICD-10-CM | POA: Diagnosis present

## 2013-12-04 DIAGNOSIS — G252 Other specified forms of tremor: Principal | ICD-10-CM

## 2013-12-04 DIAGNOSIS — R259 Unspecified abnormal involuntary movements: Secondary | ICD-10-CM | POA: Diagnosis present

## 2013-12-04 HISTORY — PX: SUBTHALAMIC STIMULATOR INSERTION: SHX5375

## 2013-12-04 LAB — SURGICAL PCR SCREEN
MRSA, PCR: NEGATIVE
Staphylococcus aureus: NEGATIVE

## 2013-12-04 LAB — GLUCOSE, CAPILLARY
Glucose-Capillary: 267 mg/dL — ABNORMAL HIGH (ref 70–99)
Glucose-Capillary: 213 mg/dL — ABNORMAL HIGH (ref 70–99)
Glucose-Capillary: 161 mg/dL — ABNORMAL HIGH (ref 70–99)

## 2013-12-04 SURGERY — SUBTHALAMIC STIMULATOR INSERTION
Anesthesia: General | Laterality: Bilateral

## 2013-12-04 MED ORDER — FAMOTIDINE 10 MG PO TABS
10.0000 mg | ORAL_TABLET | Freq: Every day | ORAL | Status: DC
  Administered 2013-12-05: 10 mg via ORAL
  Filled 2013-12-04: qty 1

## 2013-12-04 MED ORDER — ALBUTEROL SULFATE HFA 108 (90 BASE) MCG/ACT IN AERS: 2.0000 | INHALATION_SPRAY | Freq: Four times a day (QID) | RESPIRATORY_TRACT | Status: DC | PRN

## 2013-12-04 MED ORDER — MUPIROCIN 2 % EX OINT
TOPICAL_OINTMENT | CUTANEOUS | Status: AC
Start: 2013-12-04 — End: 2013-12-04
  Administered 2013-12-04: 1
  Filled 2013-12-04: qty 22

## 2013-12-04 MED ORDER — INSULIN ASPART 100 UNIT/ML ~~LOC~~ SOLN
SUBCUTANEOUS | Status: DC | PRN
  Administered 2013-12-04: 8 [IU] via SUBCUTANEOUS

## 2013-12-04 MED ORDER — ALBUTEROL SULFATE (2.5 MG/3ML) 0.083% IN NEBU: 2.5000 mg | INHALATION_SOLUTION | Freq: Four times a day (QID) | RESPIRATORY_TRACT | Status: DC | PRN

## 2013-12-04 MED ORDER — GLYCOPYRROLATE 0.2 MG/ML IJ SOLN
INTRAMUSCULAR | Status: AC
  Filled 2013-12-04: qty 3

## 2013-12-04 MED ORDER — LORAZEPAM 0.5 MG PO TABS: 1.0000 mg | ORAL_TABLET | Freq: Every day | ORAL | Status: DC | PRN

## 2013-12-04 MED ORDER — LIDOCAINE-EPINEPHRINE 1 %-1:100000 IJ SOLN
INTRAMUSCULAR | Status: DC | PRN
  Administered 2013-12-04: 20 mL

## 2013-12-04 MED ORDER — DULOXETINE HCL 60 MG PO CPEP
60.0000 mg | ORAL_CAPSULE | Freq: Every day | ORAL | Status: DC
  Administered 2013-12-04 – 2013-12-05 (×2): 60 mg via ORAL
  Filled 2013-12-04 (×2): qty 1

## 2013-12-04 MED ORDER — BUPIVACAINE HCL 0.5 % IJ SOLN
INTRAMUSCULAR | Status: DC | PRN
  Administered 2013-12-04: 30 mL

## 2013-12-04 MED ORDER — DIAZEPAM 5 MG PO TABS: 5.0000 mg | ORAL_TABLET | Freq: Four times a day (QID) | ORAL | Status: DC | PRN

## 2013-12-04 MED ORDER — MENTHOL 3 MG MT LOZG: 1.0000 | LOZENGE | OROMUCOSAL | Status: DC | PRN

## 2013-12-04 MED ORDER — SODIUM CHLORIDE 0.9 % IJ SOLN
3.0000 mL | INTRAMUSCULAR | Status: DC | PRN
Start: 2013-12-04 — End: 2013-12-05

## 2013-12-04 MED ORDER — HYDROCODONE-ACETAMINOPHEN 5-325 MG PO TABS: 1.0000 | ORAL_TABLET | Freq: Four times a day (QID) | ORAL | Status: DC | PRN

## 2013-12-04 MED ORDER — NEOSTIGMINE METHYLSULFATE 10 MG/10ML IV SOLN
INTRAVENOUS | Status: AC
  Filled 2013-12-04: qty 1

## 2013-12-04 MED ORDER — MORPHINE SULFATE 2 MG/ML IJ SOLN: 1.0000 mg | INTRAMUSCULAR | Status: DC | PRN

## 2013-12-04 MED ORDER — ONDANSETRON HCL 4 MG/2ML IJ SOLN: 4.0000 mg | INTRAMUSCULAR | Status: DC | PRN

## 2013-12-04 MED ORDER — BISACODYL 10 MG RE SUPP
10.0000 mg | Freq: Every day | RECTAL | Status: DC | PRN
Start: 2013-12-04 — End: 2013-12-05

## 2013-12-04 MED ORDER — HEMOSTATIC AGENTS (NO CHARGE) OPTIME
TOPICAL | Status: DC | PRN
  Administered 2013-12-04 (×4): 1 via TOPICAL

## 2013-12-04 MED ORDER — HYDROMORPHONE HCL PF 1 MG/ML IJ SOLN
0.2500 mg | INTRAMUSCULAR | Status: DC | PRN
Start: 2013-12-04 — End: 2013-12-04

## 2013-12-04 MED ORDER — METOPROLOL SUCCINATE ER 50 MG PO TB24
50.0000 mg | ORAL_TABLET | Freq: Every day | ORAL | Status: DC
  Administered 2013-12-04: 50 mg via ORAL
  Filled 2013-12-04: qty 1

## 2013-12-04 MED ORDER — LEVOTHYROXINE SODIUM 75 MCG PO TABS
75.0000 ug | ORAL_TABLET | Freq: Every day | ORAL | Status: DC
  Administered 2013-12-05: 75 ug via ORAL
  Filled 2013-12-04 (×2): qty 1

## 2013-12-04 MED ORDER — FENTANYL CITRATE 0.05 MG/ML IJ SOLN
INTRAMUSCULAR | Status: DC | PRN
  Administered 2013-12-04: 25 ug via INTRAVENOUS

## 2013-12-04 MED ORDER — POLYETHYLENE GLYCOL 3350 17 G PO PACK
17.0000 g | PACK | Freq: Every day | ORAL | Status: DC | PRN
Start: 2013-12-04 — End: 2013-12-05
  Filled 2013-12-04: qty 1

## 2013-12-04 MED ORDER — SODIUM CHLORIDE 0.9 % IJ SOLN
3.0000 mL | Freq: Two times a day (BID) | INTRAMUSCULAR | Status: DC
  Administered 2013-12-04 – 2013-12-05 (×2): 3 mL via INTRAVENOUS

## 2013-12-04 MED ORDER — KCL IN DEXTROSE-NACL 20-5-0.45 MEQ/L-%-% IV SOLN
INTRAVENOUS | Status: DC
  Filled 2013-12-04 (×3): qty 1000

## 2013-12-04 MED ORDER — 0.9 % SODIUM CHLORIDE (POUR BTL) OPTIME
TOPICAL | Status: DC | PRN
  Administered 2013-12-04: 1000 mL

## 2013-12-04 MED ORDER — CEFAZOLIN SODIUM 1-5 GM-% IV SOLN
1.0000 g | Freq: Three times a day (TID) | INTRAVENOUS | Status: AC
  Administered 2013-12-04 – 2013-12-05 (×2): 1 g via INTRAVENOUS
  Filled 2013-12-04 (×2): qty 50

## 2013-12-04 MED ORDER — INSULIN ASPART 100 UNIT/ML ~~LOC~~ SOLN: 0.0000 [IU] | Freq: Three times a day (TID) | SUBCUTANEOUS | Status: DC

## 2013-12-04 MED ORDER — PRIMIDONE 50 MG PO TABS
150.0000 mg | ORAL_TABLET | Freq: Two times a day (BID) | ORAL | Status: DC
  Administered 2013-12-04 – 2013-12-05 (×2): 150 mg via ORAL
  Filled 2013-12-04 (×3): qty 3

## 2013-12-04 MED ORDER — INSULIN ASPART 100 UNIT/ML ~~LOC~~ SOLN: 0.0000 [IU] | Freq: Every day | SUBCUTANEOUS | Status: DC

## 2013-12-04 MED ORDER — FENTANYL CITRATE 0.05 MG/ML IJ SOLN
INTRAMUSCULAR | Status: AC
  Filled 2013-12-04: qty 5

## 2013-12-04 MED ORDER — HYDROCODONE-ACETAMINOPHEN 5-325 MG PO TABS: 1.0000 | ORAL_TABLET | ORAL | Status: DC | PRN

## 2013-12-04 MED ORDER — ONDANSETRON HCL 4 MG/2ML IJ SOLN
INTRAMUSCULAR | Status: AC
  Filled 2013-12-04: qty 2

## 2013-12-04 MED ORDER — HYDRALAZINE HCL 20 MG/ML IJ SOLN
INTRAMUSCULAR | Status: DC | PRN
  Administered 2013-12-04: 4 mg via INTRAVENOUS

## 2013-12-04 MED ORDER — PROPOFOL INFUSION 10 MG/ML OPTIME
INTRAVENOUS | Status: DC | PRN
  Administered 2013-12-04: 125 ug/kg/min via INTRAVENOUS

## 2013-12-04 MED ORDER — HYDROMORPHONE HCL PF 1 MG/ML IJ SOLN: 0.5000 mg | INTRAMUSCULAR | Status: DC | PRN

## 2013-12-04 MED ORDER — METOPROLOL SUCCINATE ER 50 MG PO TB24
50.0000 mg | ORAL_TABLET | Freq: Two times a day (BID) | ORAL | Status: DC
  Administered 2013-12-04 – 2013-12-05 (×2): 50 mg via ORAL
  Filled 2013-12-04 (×3): qty 1

## 2013-12-04 MED ORDER — BACITRACIN ZINC 500 UNIT/GM EX OINT
TOPICAL_OINTMENT | CUTANEOUS | Status: DC | PRN
  Administered 2013-12-04: 1 via TOPICAL

## 2013-12-04 MED ORDER — DOCUSATE SODIUM 100 MG PO CAPS
100.0000 mg | ORAL_CAPSULE | Freq: Two times a day (BID) | ORAL | Status: DC
  Administered 2013-12-04 – 2013-12-05 (×2): 100 mg via ORAL
  Filled 2013-12-04 (×3): qty 1

## 2013-12-04 MED ORDER — FLUTICASONE PROPIONATE 50 MCG/ACT NA SUSP
1.0000 | Freq: Every day | NASAL | Status: DC
  Filled 2013-12-04: qty 16

## 2013-12-04 MED ORDER — ACETAMINOPHEN 325 MG PO TABS: 650.0000 mg | ORAL_TABLET | ORAL | Status: DC | PRN

## 2013-12-04 MED ORDER — FLEET ENEMA 7-19 GM/118ML RE ENEM
1.0000 | ENEMA | Freq: Once | RECTAL | Status: AC | PRN
  Filled 2013-12-04: qty 1

## 2013-12-04 MED ORDER — ACETAMINOPHEN 650 MG RE SUPP: 650.0000 mg | RECTAL | Status: DC | PRN

## 2013-12-04 MED ORDER — ALUM & MAG HYDROXIDE-SIMETH 200-200-20 MG/5ML PO SUSP: 30.0000 mL | Freq: Four times a day (QID) | ORAL | Status: DC | PRN

## 2013-12-04 MED ORDER — SENNA 8.6 MG PO TABS
1.0000 | ORAL_TABLET | Freq: Two times a day (BID) | ORAL | Status: DC
  Administered 2013-12-04 – 2013-12-05 (×2): 8.6 mg via ORAL
  Filled 2013-12-04 (×3): qty 1

## 2013-12-04 MED ORDER — PROPOFOL 10 MG/ML IV BOLUS
INTRAVENOUS | Status: AC
  Filled 2013-12-04: qty 20

## 2013-12-04 MED ORDER — LACTATED RINGERS IV SOLN
INTRAVENOUS | Status: DC | PRN
  Administered 2013-12-04: 13:00:00 via INTRAVENOUS

## 2013-12-04 MED ORDER — INSULIN ASPART 100 UNIT/ML ~~LOC~~ SOLN
0.0000 [IU] | Freq: Three times a day (TID) | SUBCUTANEOUS | Status: DC
  Administered 2013-12-04: 5 [IU] via SUBCUTANEOUS
  Administered 2013-12-05 (×2): 8 [IU] via SUBCUTANEOUS

## 2013-12-04 MED ORDER — ASPIRIN EC 81 MG PO TBEC
81.0000 mg | DELAYED_RELEASE_TABLET | Freq: Every morning | ORAL | Status: DC
  Administered 2013-12-05: 81 mg via ORAL
  Filled 2013-12-04: qty 1

## 2013-12-04 MED ORDER — GABAPENTIN 300 MG PO CAPS
300.0000 mg | ORAL_CAPSULE | Freq: Every day | ORAL | Status: DC
  Administered 2013-12-04: 300 mg via ORAL
  Filled 2013-12-04 (×2): qty 1

## 2013-12-04 MED ORDER — PHENOL 1.4 % MT LIQD: 1.0000 | OROMUCOSAL | Status: DC | PRN

## 2013-12-04 MED ORDER — SERTRALINE HCL 50 MG PO TABS
150.0000 mg | ORAL_TABLET | Freq: Every day | ORAL | Status: DC
  Administered 2013-12-04 – 2013-12-05 (×2): 150 mg via ORAL
  Filled 2013-12-04 (×2): qty 1

## 2013-12-04 MED ORDER — THROMBIN 20000 UNITS EX SOLR
CUTANEOUS | Status: DC | PRN
  Administered 2013-12-04: 14:00:00 via TOPICAL

## 2013-12-04 MED ORDER — ZOLPIDEM TARTRATE 5 MG PO TABS: 5.0000 mg | ORAL_TABLET | Freq: Every evening | ORAL | Status: DC | PRN

## 2013-12-04 MED ORDER — HYDRALAZINE HCL 20 MG/ML IJ SOLN
INTRAMUSCULAR | Status: AC
Start: 2013-12-04 — End: 2013-12-04
  Filled 2013-12-04: qty 1

## 2013-12-04 MED ORDER — LOSARTAN POTASSIUM 50 MG PO TABS
100.0000 mg | ORAL_TABLET | Freq: Every day | ORAL | Status: DC
  Administered 2013-12-05: 100 mg via ORAL
  Filled 2013-12-04: qty 2

## 2013-12-04 MED ORDER — LACTATED RINGERS IV SOLN
INTRAVENOUS | Status: DC
Start: 2013-12-04 — End: 2013-12-04

## 2013-12-04 MED ORDER — CEFAZOLIN SODIUM-DEXTROSE 2-3 GM-% IV SOLR
INTRAVENOUS | Status: AC
  Administered 2013-12-04 (×2): 2 g via INTRAVENOUS
  Filled 2013-12-04: qty 50

## 2013-12-04 MED ORDER — INSULIN ASPART 100 UNIT/ML ~~LOC~~ SOLN
SUBCUTANEOUS | Status: AC
  Filled 2013-12-04: qty 1

## 2013-12-04 SURGICAL SUPPLY — 67 items
BANDAGE ADH SHEER 1  50/CT (GAUZE/BANDAGES/DRESSINGS) ×8 IMPLANT
BENZOIN TINCTURE PRP APPL 2/3 (GAUZE/BANDAGES/DRESSINGS) IMPLANT
BIT DRILL NEURO 2X3.1 SFT TUCH (MISCELLANEOUS) IMPLANT
BLADE SURG 11 STRL SS (BLADE) ×4 IMPLANT
BLADE SURG ROTATE 9660 (MISCELLANEOUS) IMPLANT
BNDG GAUZE ELAST 4 BULKY (GAUZE/BANDAGES/DRESSINGS) ×4 IMPLANT
BOOT SUTURE AID YELLOW STND (SUTURE) IMPLANT
CABLE MER (MISCELLANEOUS) ×2 IMPLANT
CANISTER SUCT 3000ML (MISCELLANEOUS) ×2 IMPLANT
CLIP RANEY DISP (INSTRUMENTS) ×8 IMPLANT
CONT SPEC 4OZ CLIKSEAL STRL BL (MISCELLANEOUS) ×2 IMPLANT
DRAPE POUCH INSTRU U-SHP 10X18 (DRAPES) ×2 IMPLANT
DRAPE STERI IOBAN 125X83 (DRAPES) IMPLANT
DRILL NEURO 2X3.1 SOFT TOUCH (MISCELLANEOUS)
DRSG OPSITE 4X5.5 SM (GAUZE/BANDAGES/DRESSINGS) ×4 IMPLANT
DRSG TELFA 3X8 NADH (GAUZE/BANDAGES/DRESSINGS) ×4 IMPLANT
DURAPREP 26ML APPLICATOR (WOUND CARE) ×2 IMPLANT
DURAPREP 6ML APPLICATOR 50/CS (WOUND CARE) IMPLANT
DURASEAL APPLICATOR TIP (TIP) ×8 IMPLANT
DURASEAL SPINE SEALANT 3ML (MISCELLANEOUS) ×8 IMPLANT
ELECT CAUTERY BLADE 6.4 (BLADE) ×2 IMPLANT
GAUZE SPONGE 4X4 12PLY STRL (GAUZE/BANDAGES/DRESSINGS) IMPLANT
GAUZE SPONGE 4X4 16PLY XRAY LF (GAUZE/BANDAGES/DRESSINGS) IMPLANT
GLOVE BIO SURGEON STRL SZ8 (GLOVE) ×2 IMPLANT
GLOVE BIOGEL PI IND STRL 8 (GLOVE) ×2 IMPLANT
GLOVE BIOGEL PI IND STRL 8.5 (GLOVE) ×2 IMPLANT
GLOVE BIOGEL PI INDICATOR 8 (GLOVE) ×2
GLOVE BIOGEL PI INDICATOR 8.5 (GLOVE) ×2
GLOVE ECLIPSE 8.0 STRL XLNG CF (GLOVE) ×4 IMPLANT
GLOVE EXAM NITRILE LRG STRL (GLOVE) IMPLANT
GLOVE EXAM NITRILE MD LF STRL (GLOVE) IMPLANT
GLOVE EXAM NITRILE XL STR (GLOVE) IMPLANT
GLOVE EXAM NITRILE XS STR PU (GLOVE) IMPLANT
GOWN STRL REUS W/ TWL LRG LVL3 (GOWN DISPOSABLE) IMPLANT
GOWN STRL REUS W/ TWL XL LVL3 (GOWN DISPOSABLE) IMPLANT
GOWN STRL REUS W/TWL 2XL LVL3 (GOWN DISPOSABLE) IMPLANT
GOWN STRL REUS W/TWL LRG LVL3 (GOWN DISPOSABLE)
GOWN STRL REUS W/TWL XL LVL3 (GOWN DISPOSABLE)
KIT ACCESSORY (KITS) ×1
KIT ACCESSORY NEUROSURG SU (KITS) ×1 IMPLANT
KIT BASIN OR (CUSTOM PROCEDURE TRAY) ×2 IMPLANT
KIT ROOM TURNOVER OR (KITS) ×2 IMPLANT
LEAD DBS (Neuro Prosthesis/Implant) ×4 IMPLANT
MARKER SKIN DUAL TIP RULER LAB (MISCELLANEOUS) ×4 IMPLANT
NEEDLE HYPO 25X1 1.5 SAFETY (NEEDLE) ×4 IMPLANT
NEEDLE SPNL 18GX3.5 QUINCKE PK (NEEDLE) IMPLANT
NEEDLE SPNL 22GX3.5 QUINCKE BK (NEEDLE) IMPLANT
NS IRRIG 1000ML POUR BTL (IV SOLUTION) ×2 IMPLANT
PACK LAMINECTOMY NEURO (CUSTOM PROCEDURE TRAY) ×2 IMPLANT
PAD ARMBOARD 7.5X6 YLW CONV (MISCELLANEOUS) ×4 IMPLANT
PERFORATOR LRG  14-11MM (BIT) ×2
PERFORATOR LRG 14-11MM (BIT) ×2 IMPLANT
PLATEFORM ARRAY DZAP LEADPOINT (MISCELLANEOUS) ×1
PLATFORM ARRAY DZAP LEADPOINT (MISCELLANEOUS) ×1 IMPLANT
PLATFORM BILATERAL SEQUENTIAL (MISCELLANEOUS) ×2 IMPLANT
SET SINGLE IT STRL (KITS) ×2 IMPLANT
SPONGE SURGIFOAM ABS GEL SZ50 (HEMOSTASIS) ×2 IMPLANT
STAPLER SKIN PROX WIDE 3.9 (STAPLE) ×2 IMPLANT
SUT ETHILON 3 0 PS 1 (SUTURE) IMPLANT
SUT SILK 2 0 TIES 10X30 (SUTURE) ×4 IMPLANT
SUT VIC AB 2-0 CP2 18 (SUTURE) ×2 IMPLANT
SYR CONTROL 10ML LL (SYRINGE) ×4 IMPLANT
TOWEL OR 17X24 6PK STRL BLUE (TOWEL DISPOSABLE) ×2 IMPLANT
TOWEL OR 17X26 10 PK STRL BLUE (TOWEL DISPOSABLE) ×2 IMPLANT
TRAY FOLEY CATH 14FRSI W/METER (CATHETERS) IMPLANT
TUBE CONNECTING 12X1/4 (SUCTIONS) ×2 IMPLANT
WATER STERILE IRR 1000ML POUR (IV SOLUTION) ×2 IMPLANT

## 2013-12-04 NOTE — Brief Op Note (Signed)
12/04/2013  6:55 PM  PATIENT:  [name]  63 y.o. female  PRE-OPERATIVE DIAGNOSIS:  Essential Tremor  POST-OPERATIVE DIAGNOSIS:  Essential Tremor   PROCEDURE:  Procedure(s) with comments: Bilateral Deep brain stimulator placement (Bilateral) - Bilateral Deep brain stimulator placement VIM Thalamus  SURGEON:  Surgeon(s) and Role:    * [name], MD - Primary    * Eustace Quail Tat, DO - Assisting  PHYSICIAN ASSISTANT:   ASSISTANTS: Poteat, RN   ANESTHESIA:   IV sedation  EBL:  Total I/O In: 44 [P.O.:360; I.V.:600] Out: 100 [Blood:100]  BLOOD ADMINISTERED:none  DRAINS: none   LOCAL MEDICATIONS USED:  LIDOCAINE   SPECIMEN:  No Specimen  DISPOSITION OF SPECIMEN:  N/A  COUNTS:  YES  TOURNIQUET:  * No tourniquets in log *  DICTATION: DICTATION:   Indications: Patient is a 63 year old woman with Essential Tremor it was elected to take her to surgery for bilateral VIM Thalamus deep brain stimulator electrode placement  Procedure: Preoperative planing was performed with volumetricCT and placement of 4 fiducial markers in the skull followed by CT of the brain also obtained volumetrically. These were then exported to create Starfix head frame with planned targeting of VIM nucleus electrodes was performed. The patient was brought to the operating room and placed in a semi-Fowler's position with her neck and stabilized in a neck holder. This was affixed to the [place] adapter. Her scalp was then prepped with DuraPrep and subsequently draped with an Ioban drape.with the fiducials and the skin was infiltrated with local lidocaine.  The areas of planned incision were then infiltrated with local lidocaine with epinephrine. The stepoffs were connected and the Starfix frame was assembled.  The entry points were then marked.  2 curvilinear incisions were made centered on the bilateral entry points. An elevator was used to clear pericranium from the skull. The perforator it was then  used to produce two 14 mm bur holes. The dura was coagulated with bipolar electrocautery. Initially we operated on the left and subsequently on the right. After opening the dura and a localizing the entry point the stylette and outer cannula were inserted into the brain. DuraSeal was placed to prevent CSF leakage at each entry site. Microelectrode recordings were then performed and  we had very good recordings from the VIM. Subsequently the stimulating electrode was placed and the patient had significant improvement in tremor on the right side of the body but had significant side effects to higher voltages. We then made two additional passes, initially laterally, then medially, but never had recordings as good as the initial tract.  We then elected to place the stimulating electrode in its original position and control side effects with changing stimulating parameters. The electrode was then locked into position with the stimlock cap. The redundant electrode was circularized and tunneled under the scalp. The assembly was reassembled on the right.  There was good microelectrode recordings for 5 mm of VIM and after placing the stimulating electrode the patient had good control of tremor on the left side of her body.  We therefore elected to place this electrode and locked into position and the stereotactic assembly was disassembled. Both electrodes were tunneled to the posterior scalp and locked into position. The wounds were then irrigated and closed with 2-0 Vicryl sutures and staples. The fiducials were removed and staples were placed over each of these sites. The head was washed and then sterile occlusive dressings were placed. The patient was taken to recovery having tolerated  her procedure without difficulty or untoward effect. Please refer to detailed microelectrode recordings from [name] for more specifics of the positioning of the electrodes. These are included in her Epic note from the detailed neural  monitoring and physical exam assessment during the surgery.  PLAN OF CARE: Admit to inpatient   PATIENT DISPOSITION:  PACU - hemodynamically stable.   Delay start of Pharmacological VTE agent (>24hrs) due to surgical blood loss or risk of bleeding: yes

## 2013-12-04 NOTE — Progress Notes (Signed)
Awake, alert, conversant.  Doing well.  Baseline tremor.

## 2013-12-04 NOTE — Anesthesia Preprocedure Evaluation (Addendum)
Anesthesia Evaluation  Patient identified by MRN, date of birth, ID band Patient awake    Reviewed: Allergy & Precautions, H&P , NPO status , Patient's Chart, lab work & pertinent test results, reviewed documented beta blocker date and time   Airway Mallampati: III TM Distance: >3 FB Neck ROM: Full    Dental no notable dental hx. (+) Teeth Intact, Poor Dentition, Dental Advisory Given   Pulmonary asthma , former smoker,  breath sounds clear to auscultation  Pulmonary exam normal       Cardiovascular hypertension, Pt. on medications and Pt. on home beta blockers Rhythm:Regular Rate:Normal     Neuro/Psych Seizures -, Well Controlled,  Anxiety Depression    GI/Hepatic Neg liver ROS, GERD-  Medicated and Controlled,  Endo/Other  diabetes, Type 1, Insulin DependentHypothyroidism   Renal/GU negative Renal ROS  negative genitourinary   Musculoskeletal   Abdominal   Peds  Hematology negative hematology ROS (+)   Anesthesia Other Findings   Reproductive/Obstetrics negative OB ROS                          Anesthesia Physical Anesthesia Plan  ASA: III  Anesthesia Plan: General   Post-op Pain Management:    Induction: Intravenous  Airway Management Planned: Oral ETT  Additional Equipment:   Intra-op Plan:   Post-operative Plan: Extubation in OR  Informed Consent: I have reviewed the patients History and Physical, chart, labs and discussed the procedure including the risks, benefits and alternatives for the proposed anesthesia with the patient or authorized representative who has indicated his/her understanding and acceptance.   Dental advisory given  Plan Discussed with: CRNA  Anesthesia Plan Comments:         Anesthesia Quick Evaluation

## 2013-12-04 NOTE — Procedures (Signed)
Preoperative diagnosis:  Essential tremor Postoperative diagnosis:  Essential tremor Surgeon:  [name] Neurologist:  [name]  CPT codes: [phone] (first hour) [phone] (for each additional hour) Indication for procedure: Determination of optimal electrode position for deep brain stimulation therapy.    Description of procedure:  Following the incision and burr hole placement, a recording microelectrode was slowly advanced into the brain via a motorized drive.  Beginning approximately 10 mm above the target, recordings were periodically made, and the resultant brain activity was described on the neurophysiologic recording worksheet.  Relevant samples of the recordings were saved for subsequent off line analysis.  A total of 3 recording passes were obtained on the left side of the brain.    5 mm of thalamus was recorded from the left side of the brain along with 1.9-1YN of zona incerta. Attempts were made at moving the lead lateral and medial to the target position, but recordings of thalamus were not robust in those locations and it was decided to go back to the target site.  Following the recording, the recording electrode was replaced by a stimulating electrode by Dr. Vertell Limber.  Test stimulations were then obtained.  Active contacts that were used and the response to stimulation, including both adverse effects and therapeutic effects, were recorded on the neurophysiologic stimulation worksheet.  In brief, there was transient paresthesias of the lip and hand at therapeutic voltage.  Just above therapeutic voltages, the patient had sustained diplopia.  A target of mirror image of the initial side was selected for the contralateral side and one recording pass was obtained on the right side of the brain.  5 mm of thalamus were recorded from the right side of the brain along with another 8-2.9 mm of zona incerta.    The recording electrode was again replaced by a stimulating electrode and test  stimulations were obtained.  Again, there was marked improvement of tremor at therapeutic voltages with diplopia only occuring at the highest contact (3) at therapeutic voltages.  Final electrode position was determined and the electrode was secured in place by Dr. Vertell Limber on each side.  Alonza Bogus, DO [facility] Director of Movement Disorders

## 2013-12-04 NOTE — Anesthesia Postprocedure Evaluation (Signed)
  Anesthesia Post-op Note  Patient: [name]  Procedure(s) Performed: Procedure(s) with comments: Bilateral Deep brain stimulator placement (Bilateral) - Bilateral Deep brain stimulator placement  Patient Location: PACU  Anesthesia Type: MAC  Level of Consciousness: awake and alert   Airway and Oxygen Therapy: Patient Spontanous Breathing  Post-op Pain: none  Post-op Assessment: Post-op Vital signs reviewed, Patient's Cardiovascular Status Stable and Respiratory Function Stable  Post-op Vital Signs: Reviewed  Filed Vitals:   12/04/13 1723  BP: 143/83  Pulse: 103  Temp:   Resp: 16    Complications: No apparent anesthesia complications

## 2013-12-04 NOTE — Interval H&P Note (Signed)
History and Physical Interval Note:  12/04/2013 12:53 PM  [name]  has presented today for surgery, with the diagnosis of Tremor  The various methods of treatment have been discussed with the patient and family. After consideration of risks, benefits and other options for treatment, the patient has consented to  Procedure(s) with comments: Bilateral Deep brain stimulator placement (Bilateral) - Bilateral Deep brain stimulator placement as a surgical intervention .  The patient's history has been reviewed, patient examined, no change in status, stable for surgery.  I have reviewed the patient's chart and labs.  Questions were answered to the patient's satisfaction.     Laurinda Carreno D

## 2013-12-04 NOTE — Op Note (Signed)
12/04/2013  6:55 PM  PATIENT:  [name]  63 y.o. female  PRE-OPERATIVE DIAGNOSIS:  Essential Tremor  POST-OPERATIVE DIAGNOSIS:  Essential Tremor   PROCEDURE:  Procedure(s) with comments: Bilateral Deep brain stimulator placement (Bilateral) - Bilateral Deep brain stimulator placement VIM Thalamus  SURGEON:  Surgeon(s) and Role:    * Elizabeth Levine, MD - Primary    * Elizabeth Quail Tat, DO - Assisting  PHYSICIAN ASSISTANT:   ASSISTANTS: Poteat, RN   ANESTHESIA:   IV sedation  EBL:  Total I/O In: 47 [P.O.:360; I.V.:600] Out: 100 [Blood:100]  BLOOD ADMINISTERED:none  DRAINS: none   LOCAL MEDICATIONS USED:  LIDOCAINE   SPECIMEN:  No Specimen  DISPOSITION OF SPECIMEN:  N/A  COUNTS:  YES  TOURNIQUET:  * No tourniquets in log *  DICTATION: DICTATION:   Indications: Patient is a 63 year old woman with Essential Tremor it was elected to take her to surgery for bilateral VIM Thalamus deep brain stimulator electrode placement  Procedure: Preoperative planing was performed with volumetricCT and placement of 4 fiducial markers in the skull followed by CT of the brain also obtained volumetrically. These were then exported to create Starfix head frame with planned targeting of VIM nucleus electrodes was performed. The patient was brought to the operating room and placed in a semi-Fowler's position with her neck and stabilized in a neck holder. This was affixed to the [place] adapter. Her scalp was then prepped with DuraPrep and subsequently draped with an Ioban drape.with the fiducials and the skin was infiltrated with local lidocaine.  The areas of planned incision were then infiltrated with local lidocaine with epinephrine. The stepoffs were connected and the Starfix frame was assembled.  The entry points were then marked.  2 curvilinear incisions were made centered on the bilateral entry points. An elevator was used to clear pericranium from the skull. The perforator it was then  used to produce two 14 mm bur holes. The dura was coagulated with bipolar electrocautery. Initially we operated on the left and subsequently on the right. After opening the dura and a localizing the entry point the stylette and outer cannula were inserted into the brain. DuraSeal was placed to prevent CSF leakage at each entry site. Microelectrode recordings were then performed and  we had very good recordings from the VIM. Subsequently the stimulating electrode was placed and the patient had significant improvement in tremor on the right side of the body but had significant side effects to higher voltages. We then made two additional passes, initially laterally, then medially, but never had recordings as good as the initial tract.  We then elected to place the stimulating electrode in its original position and control side effects with changing stimulating parameters. The electrode was then locked into position with the stimlock cap. The redundant electrode was circularized and tunneled under the scalp. The assembly was reassembled on the right.  There was good microelectrode recordings for 5 mm of VIM and after placing the stimulating electrode the patient had good control of tremor on the left side of her body.  We therefore elected to place this electrode and locked into position and the stereotactic assembly was disassembled. Both electrodes were tunneled to the posterior scalp and locked into position. The wounds were then irrigated and closed with 2-0 Vicryl sutures and staples. The fiducials were removed and staples were placed over each of these sites. The head was washed and then sterile occlusive dressings were placed. The patient was taken to recovery having tolerated  her procedure without difficulty or untoward effect. Please refer to detailed microelectrode recordings from Dr. Carles [name] for more specifics of the positioning of the electrodes. These are included in her Epic note from the detailed neural  monitoring and physical exam assessment during the surgery.  PLAN OF CARE: Admit to inpatient   PATIENT DISPOSITION:  PACU - hemodynamically stable.   Delay start of Pharmacological VTE agent (>24hrs) due to surgical blood loss or risk of bleeding: yes

## 2013-12-04 NOTE — Plan of Care (Signed)
Problem: Consults Goal: Diagnosis - Spinal Surgery Outcome: Completed/Met Date Met:  12/04/13 BILATERAL DEEP BRAIN STIMULATOR PLACEMENT

## 2013-12-04 NOTE — Progress Notes (Signed)
Patient ID: [name], female   DOB: 25-Jul-1950, 63 y.o.   MRN: [id number] Alert, conversant. MAEW. PEARL. Drsgs intact, dry over staples. Pt verbalizes understanding of plan for MRI tomorrow and d/c to home likely soon after. Spoke with husband as well who verbalizes understanding & agreement.  Pt preparing for transfer to 3500 at present.  Verdis Prime RN, BSN

## 2013-12-04 NOTE — Transfer of Care (Signed)
Immediate Anesthesia Transfer of Care Note  Patient: [name]  Procedure(s) Performed: Procedure(s) with comments: Bilateral Deep brain stimulator placement (Bilateral) - Bilateral Deep brain stimulator placement  Patient Location: PACU  Anesthesia Type MAC  Level of Consciousness: awake, alert  and oriented  Airway & Oxygen Therapy: Patient Spontanous Breathing  Post-op Assessment: Report given to PACU RN and Post -op Vital signs reviewed and stable  Post vital signs: Reviewed and stable  Complications: No apparent anesthesia complications

## 2013-12-04 NOTE — H&P (Signed)
[address] [place], [place] [address] Phone: [phone]   Patient ID:   [id number] Patient: [name]  Date of Birth: 1950-11-07 Visit Type: Office Visit   Date: 09/18/2013 11:30 AM Provider: Marchia Meiers. Vertell Limber MD   This 63 year old female presents for Tremor.  History of Present Illness: 1.  Tremor  Patient is a delightful 63 year old woman who works as an Engineering geologist at [employer] with comorbidities of GERD, insulin-dependent diabetes mellitus and asthma who has been diagnosed with essential tremor since the 1960s.  She says her mother had this as well as did her father.  She has problems with tremulousness of speech and head tremor as well as right greater than left upper extremity tremor.  The patient says that the tremor is now quite disabling to her and is interfering with her ability to function.  She has visited with Dr. Kingsley Callander Occasions.  She Is Felt to Be a Good Candidate for VIM Deep Brain Stimulation.  The patient wishes to go ahead with bilateral stimulator placement but if can only be approved on a unilateral basis she would like it done on the left for right handed symptoms.  She is reviewed EVD and met with the [employer] representative and has decided she wishes a good with surgery and comes to discuss this in detail and review the surgical procedure with me.        PAST MEDICAL/SURGICAL HISTORY   (Detailed)  Disease/disorder Onset Date Management Date Comments    Cholecystectomy 1991     Partial hysterectomy      C-section    Anxiety      Arthritis      Asthma      Depression      Diabetes mellitus      Thyroid disease          PAST MEDICAL HISTORY, SURGICAL HISTORY, FAMILY HISTORY, SOCIAL HISTORY AND REVIEW OF SYSTEMS I have reviewed the patient's past medical, surgical, family and social history as well as the comprehensive review of systems as included on the [facility] history form dated 09/18/2013,  which I have signed.  Family History  (Detailed)  Relationship Family Member Name Deceased Age at Death Condition Onset Age Cause of Death      Family history of Diabetes mellitus  N   SOCIAL HISTORY  (Detailed) Tobacco use reviewed. Preferred language is Unknown.   Smoking status: Former smoker.  SMOKING STATUS Use Status Type Smoking Status Usage Per Day Years Used Total Pack Years  yes  Former smoker       HOME ENVIRONMENT/SAFETY The patient has not fallen in the last year.        MEDICATIONS(added, continued or stopped this visit):   Started Medication Directions Instruction Stopped   Advair HFA 115 mcg-21 mcg/actuation aerosol inhaler inhale 2 puff by inhalation route 2 times every day in the morning and evening     albuterol sulfate 2.5 mg/3 mL (0.083 %) solution for nebulization inhale 3 milliliters by nebulization route  every 6 hours as needed for wheezing or shortness of breath     aspirin 81 mg tablet,delayed release take 1 tablet by oral route  every day     Benicar 40 mg tablet take 1 tablet by oral route  every day     cyclobenzaprine 10 mg tablet take 0.5 tablet by oral route 3 times every day as needed     diphenhydramine 25 mg tablet take  1 tablet by oral route  every day at bedtime as needed     duloxetine 60 mg capsule,delayed release take 1 capsule by oral route  every day     Humulin N 100 unit/mL subcutaneous suspension Inject 90 units each morning and 20 units in the evening     levothyroxine 75 mcg tablet take 1 tablet by oral route  every day     lorazepam 1 mg tablet take 1 tablet by oral route 3 times every day as needed     metoprolol succinate ER 50 mg tablet,extended release 24 hr take 1 tablet by oral route 2 times every day     naproxen 500 mg tablet Take as directed     Nasonex 50 mcg/actuation Spray spray 2 spray by intranasal route  every day in each nostril     primidone 50 mg tablet take 3 tablet by oral route 2 times every day      ProAir HFA 90 mcg/actuation aerosol inhaler inhale 2 puff by inhalation route  every 6 hours as needed     promethazine-codeine 6.25 mg-10 mg/5 mL syrup take 5 milliliter by oral route 2 times every day as needed for cough     ranitidine 150 mg tablet take 1 tablet by oral route  every day     sertraline 100 mg tablet take 1.5 tablets by oral route  every day      ALLERGIES:   Vitals Date Temp F BP Pulse Ht In Wt Lb BMI BSA Pain Score  09/18/2013  134/81 92 64.5 193 32.62       PHYSICAL EXAM General Level of Distress: no acute distress Overall Appearance: normal  Head and Face  Right Left  Fundoscopic Exam:  normal normal    Cardiovascular Cardiac: regular rate and rhythm without murmur  Right Left  Carotid Pulses: normal normal  Respiratory Lungs: clear to auscultation  Neurological Orientation: normal Recent and Remote Memory: normal Attention Span and Concentration:   normal Language: normal Fund of Knowledge: normal  Right Left Sensation: normal normal Upper Extremity Coordination: normal normal  Lower Extremity Coordination: normal normal  Musculoskeletal Gait and Station: normal  Right Left Upper Extremity Muscle Strength: normal normal Lower Extremity Muscle Strength: normal normal Upper Extremity Muscle Tone:  normal normal Lower Extremity Muscle Tone: normal normal  Motor Strength Upper and lower extremity motor strength was tested in the clinically pertinent muscles.     Deep Tendon Reflexes  Right Left Biceps: normal normal Triceps: normal normal Brachiloradialis: normal normal Patellar: normal normal Achilles: normal normal  Cranial Nerves II. Optic Nerve/Visual Fields: normal III. Oculomotor: normal IV. Trochlear: normal V. Trigeminal: normal VI. Abducens: normal VII. Facial: normal VIII. Acoustic/Vestibular: normal IX. Glossopharyngeal: normal X. Vagus: normal XI. Spinal Accessory: normal XII. Hypoglossal: normal  Motor and  other Tests Lhermittes: negative Rhomberg: negative Pronator drift: absent     Right Left Hoffman's: normal normal Clonus: normal normal Babinski: normal normal   Additional Findings:  Patient has significant tremor both of her head as well as tremulous speech and right greater than left upper extremity tremors per patient although to my examination the left upper extremity appears to be more affected today than the right.  She says generally it is much more bothersome on the right than the left.  Her writing was assessed and is clearly affected by tremor.      IMPRESSION Severe bilateral upper extremity greater than lower extremity tremor due to essential tremor.  Patient  wishes to proceed with bilateral VIM DBS placement for tremor.  Completed Orders (this encounter) Order Details Reason Side Interpretation Result Initial Treatment Date Region  Lifestyle education regarding diet Encouraged to eat a well balanced diet and follow up with primary care physician.         Assessment/Plan # Detail Type Description   1. Assessment Tremor (781.0).       2. Assessment Diabetes (250.00).       3. Assessment BMI 32.0-32.9,ADULT (V85.32).   Plan Orders Today's instructions / counseling include(s) Lifestyle education regarding diet.         Fall Risk Plan The patient has not fallen in the last year.  Proceed with Star fix fiducial placement in preparation for bilateral VIM DBS surgery.  Risks and benefits were discussed in detail with the patient and she wishes to proceed  Orders: Instruction(s)/Education: Assessment Instruction  V85.32 Lifestyle education regarding diet             Provider:  Marchia Meiers. Vertell Limber MD  09/24/2013 03:13 PM Dictation edited by: Marchia Meiers. Vertell Limber    CC Providers: [name] [address] [place], [place]  [id number] ----------------------------------------------------------------------------------------------------------------------------------------------------------------------         Electronically signed by Marchia Meiers Vertell Limber MD on 09/24/2013 03:13 PM

## 2013-12-05 ENCOUNTER — Inpatient Hospital Stay (HOSPITAL_COMMUNITY): Payer: Medicare Other

## 2013-12-05 ENCOUNTER — Encounter (HOSPITAL_COMMUNITY): Payer: Self-pay | Admitting: Neurosurgery

## 2013-12-05 LAB — POCT I-STAT GLUCOSE
Glucose, Bld: 225 mg/dL — ABNORMAL HIGH (ref 70–99)
Operator id: 212621

## 2013-12-05 LAB — GLUCOSE, CAPILLARY
Glucose-Capillary: 288 mg/dL — ABNORMAL HIGH (ref 70–99)
Glucose-Capillary: 275 mg/dL — ABNORMAL HIGH (ref 70–99)
Glucose-Capillary: 293 mg/dL — ABNORMAL HIGH (ref 70–99)

## 2013-12-05 LAB — HEMOGLOBIN A1C
Hgb A1c MFr Bld: 8.1 % — ABNORMAL HIGH (ref <5.7)
Mean Plasma Glucose: 186 mg/dL — ABNORMAL HIGH (ref <117)

## 2013-12-05 MED ORDER — HYDROCODONE-ACETAMINOPHEN 5-325 MG PO TABS: 1.0000 | ORAL_TABLET | ORAL | Status: DC | PRN

## 2013-12-05 NOTE — Progress Notes (Signed)
Patient doing well. D/C home after MRI

## 2013-12-05 NOTE — Discharge Summary (Signed)
Physician Discharge Summary  Patient ID: [name] MRN: [id number] DOB/AGE: [birth year] 63 y.o.  Admit date: 12/04/2013 Discharge date: 12/05/2013  Admission Diagnoses:  Discharge Diagnoses:  Active Problems:   Essential tremor   Discharged Condition: good  Hospital Course: [name] was admitted for bilateral deep brain stimulator lead placement for dx Essential Tremor. Following uncomplicated surgery, she recovered nicely & transferred to 3500 for overnight observation.   Consults: None  Significant Diagnostic Studies: radiology: MRI day 1 post-op for lead placement verification  Treatments: surgery: Bilateral Deep brain stimulator placement (Bilateral) - Bilateral Deep brain stimulator placement VIM Thalamus   Discharge Exam: Blood pressure 156/70, pulse 105, temperature 98.8 F (37.1 C), temperature source Oral, resp. rate 18, weight 87.091 kg (192 lb), SpO2 95.00%. Alert, conversant. Without c/o pain or discomfort. Scalp drsgs intact, dry.  PEARL. MAEW.     Disposition: 01-Home or Self Care  Staples will be removed in OR with next stage (IPG placement) already scheduled. Ok for pt to remove dressings at home & cleanse scalp/incisions with mild soap/shampoo, leaving clean & dry.       Medication List    ASK your doctor about these medications       albuterol 108 (90 BASE) MCG/ACT inhaler  Commonly known as:  PROAIR HFA  Inhale 2 puffs into the lungs every 6 (six) hours as needed for wheezing or shortness of breath.     albuterol (2.5 MG/3ML) 0.083% nebulizer solution  Commonly known as:  PROVENTIL  Take 2.5 mg by nebulization every 6 (six) hours as needed for wheezing or shortness of breath.     aspirin EC 81 MG tablet  Take 81 mg by mouth every morning.     DULoxetine 60 MG capsule  Commonly known as:  CYMBALTA  Take 1 capsule (60 mg total) by mouth daily.     gabapentin 100 MG capsule  Commonly known as:  NEURONTIN  Take 300 mg by mouth at  bedtime.     glucose blood test strip  Commonly known as:  ONE TOUCH ULTRA TEST  1 each by Other route 2 (two) times daily. And lancets 2/day 250.01     HYDROcodone-acetaminophen 5-325 MG per tablet  Commonly known as:  NORCO/VICODIN  Take 1-2 tablets by mouth every 6 (six) hours as needed for moderate pain.     insulin NPH-regular Human (70-30) 100 UNIT/ML injection  Commonly known as:  NOVOLIN 70/30  Inject 40-90 Units into the skin 2 (two) times daily with a meal. 90 units with breakfast and 40 units with evening meal and syringes 2/day.     Insulin Syringe-Needle U-100 30G X 1/2" 0.5 ML Misc  Commonly known as:  B-D INS SYRINGE 0.5CC/30GX1/2"  Use 1 per day to inject insulin.     levothyroxine 75 MCG tablet  Commonly known as:  SYNTHROID, LEVOTHROID  Take 75 mcg by mouth daily before breakfast.     LORazepam 1 MG tablet  Commonly known as:  ATIVAN  Take 1 mg by mouth daily as needed for anxiety.     losartan 100 MG tablet  Commonly known as:  COZAAR  Take 1 tablet (100 mg total) by mouth daily.     metoprolol succinate 50 MG 24 hr tablet  Commonly known as:  TOPROL-XL  Take 1 tablet (50 mg total) by mouth 2 (two) times daily. Take with or immediately following a meal.     mometasone 50 MCG/ACT nasal spray  Commonly known as:  NASONEX  Place 2 sprays into the nose daily as needed (for allergies).     naproxen 500 MG tablet  Commonly known as:  NAPROSYN  Take 500 mg by mouth 2 (two) times daily as needed (for pain).     primidone 50 MG tablet  Commonly known as:  MYSOLINE  Take 3 tablets (150 mg total) by mouth 2 (two) times daily.     ranitidine 150 MG tablet  Commonly known as:  ZANTAC  Take 150 mg by mouth daily.     sertraline 100 MG tablet  Commonly known as:  ZOLOFT  Take 1.5 tablets (150 mg total) by mouth daily.         Signed: Verdis Prime 12/05/2013, 7:30 AM

## 2013-12-05 NOTE — Progress Notes (Signed)
Subjective: Patient reports "I feel alright"  Objective: Vital signs in last 24 hours: Temp:  [98.3 F (36.8 C)-99.5 F (37.5 C)] 98.8 F (37.1 C) (09/01 0416) Pulse Rate:  [95-110] 105 (09/01 0416) Resp:  [16-22] 18 (09/01 0416) BP: (130-167)/(53-95) 156/70 mmHg (09/01 0416) SpO2:  [94 %-99 %] 95 % (09/01 0416) Weight:  [87.091 kg (192 lb)] 87.091 kg (192 lb) (08/31 1019)  Intake/Output from previous day: 08/31 0701 - 09/01 0700 In: 960 [P.O.:360; I.V.:600] Out: 100 [Blood:100] Intake/Output this shift:    Alert, conversant. Without c/o pain or discomfort. Scalp drsgs intact, dry.  PEARL. MAEW.   Lab Results: No results found for this basename: WBC, HGB, HCT, PLT,  in the last 72 hours BMET No results found for this basename: NA, K, CL, CO2, GLUCOSE, BUN, CREATININE, CALCIUM,  in the last 72 hours  Studies/Results: No results found.  Assessment/Plan: Stable   LOS: 1 day  MRI today for DBS electrode placement verification. Plan to d/c home after MRI. Staples will be removed in OR with next stage (IPG placement) already scheduled. Ok for pt to remove dressings at home & cleanse scalp/incisions with mild soap/shampoo, leaving clean & dry.    [name] 12/05/2013, 7:26 AM

## 2013-12-05 NOTE — Progress Notes (Signed)
Occupational Therapy Evaluation Patient Details Name: [name] MRN: [id number] DOB: 03-30-1951 Today's Date: 12/05/2013    History of Present Illness [name] was admitted for bilateral deep brain stimulator lead placement for dx Essential Tremor   Clinical Impression   Completed all education with pt. AE for self feeding given to pt. Pt will have adequate S for home D/C. No DME needs. Ready to D/C when medically stable.     Follow Up Recommendations  No OT follow up;Supervision - Intermittent    Equipment Recommendations  None recommended by OT    Recommendations for Other Services       Precautions / Restrictions Precautions Precautions: Fall      Mobility Bed Mobility Overal bed mobility: Modified Independent                Transfers Overall transfer level: Modified independent   Transfers: Sit to/from Stand Sit to Stand: Supervision         General transfer comment: supervision for safety with decreased balance    Balance Overall balance assessment: Needs assistance   Sitting balance-Leahy Scale: Good       Standing balance-Leahy Scale: Fair Standing balance comment: S due to increased period of time in bed                            ADL Overall ADL's : At baseline                                       General ADL Comments: Pt issued weighted utensils and weighted cup with lid to assist with self feedig and decrease spillage. Pt reports that new utensils and cup help.     Vision                     Perception     Praxis      Pertinent Vitals/Pain Pain Assessment: 0-10 Pain Score: 2  Pain Location: head Pain Descriptors / Indicators: Aching Pain Intervention(s): Limited activity within patient's tolerance     Hand Dominance Right   Extremity/Trunk Assessment Upper Extremity Assessment Upper Extremity Assessment: RUE deficits/detail;LUE deficits/detail RUE Coordination: decreased  fine motor;decreased gross motor (difficulty with coordination/motor control due to tremors BU) LUE Coordination: decreased fine motor;decreased gross motor   Lower Extremity Assessment Lower Extremity Assessment: Overall WFL for tasks assessed   Cervical / Trunk Assessment Cervical / Trunk Assessment: Normal   Communication Communication Communication: No difficulties   Cognition Arousal/Alertness: Awake/alert Behavior During Therapy: WFL for tasks assessed/performed Overall Cognitive Status: No family/caregiver present to determine baseline cognitive functioning (most likely at baseline) Area of Impairment: Safety/judgement         Safety/Judgement: Decreased awareness of deficits         General Comments       Exercises       Shoulder Instructions      Home Living Family/patient expects to be discharged to:: Private residence Living Arrangements: Spouse/significant other Available Help at Discharge: Family;Available 24 hours/day Type of Home: House Home Access: Level entry     Home Layout: One level     Bathroom Shower/Tub: Teacher, early years/pre: Standard Bathroom Accessibility: Yes How Accessible: Accessible via walker Home Equipment: None          Prior Functioning/Environment Level of Independence: Independent  Comments: spouse does the cooking and cleaning    OT Diagnosis:     OT Problem List:     OT Treatment/Interventions:      OT Goals(Current goals can be found in the care plan section) Acute Rehab OT Goals Patient Stated Goal: return home   OT Frequency:     Barriers to D/C:            Co-evaluation              End of Session Nurse Communication: Mobility status  Activity Tolerance: Patient tolerated treatment well Patient left: in chair;with call bell/phone within reach   Time: 8003-4917 OT Time Calculation (min): 16 min Charges:  OT General Charges $OT Visit: 1 Procedure OT  Evaluation $Initial OT Evaluation Tier I: 1 Procedure OT Treatments $Self Care/Home Management : 8-22 mins G-Codes:    Keni Elison,HILLARY 12-23-2013, 9:10 AM   Maurie Boettcher, OTR/L  [phone] 2013-12-23

## 2013-12-05 NOTE — Progress Notes (Signed)
Utilization review completed.  

## 2013-12-05 NOTE — Progress Notes (Signed)
Inpatient Diabetes Program Recommendations  AACE/ADA: New Consensus Statement on Inpatient Glycemic Control (2013)  Target Ranges:  Prepandial:   less than 140 mg/dL      Peak postprandial:   less than 180 mg/dL (1-2 hours)      Critically ill patients:  140 - 180 mg/dL   Results for [name], [name] (MRN [id number]) as of 12/05/2013 14:19  Ref. Range 12/04/2013 10:14 12/04/2013 18:22 12/04/2013 21:50 12/05/2013 08:08 12/05/2013 12:01  Glucose-Capillary Latest Range: 70-99 mg/dL 267 (H) 213 (H) 161 (H) 288 (H) 275 (H)   Diabetes history: DM2 Outpatient Diabetes medications: Novolin 70/30 90 units with breakfast, Novolin 70/30 40 units with supper Current orders for Inpatient glycemic control: Novolog 0-15 units AC, Novolog 0-5 units HS  Inpatient Diabetes Program Recommendations Insulin - Basal: If patient is not discharged today as planned, please consider ordering low dose basal insulin; recommend starting with Levemir 17 units Q24H (based on 87 kg x 0.2 units).  Thanks, Barnie Alderman, RN, MSN, CCRN Diabetes Coordinator Inpatient Diabetes Program [phone] (Team Pager) [phone] ([facility] office) [phone] [facility] office)

## 2013-12-05 NOTE — Discharge Instructions (Signed)
Wound Care Leave Dressing alone. You may shower. Do not scrub directly on incision.  Do not put any creams, lotions, or ointments on incision. Activity Walk each and every day, increasing distance each day. No lifting greater than 5 lbs.   Diet Resume your normal diet.   Call Your Doctor If Any of These Occur Redness, drainage, or swelling at the wound.  Temperature greater than 101 degrees. Severe pain not relieved by pain medication. Headache that is not going away. Incision starts to come apart. Follow Up Appt Call [phone]  for problems.  If you have any hardware placed in your spine, you will need an x-ray before your appointment.

## 2013-12-05 NOTE — Evaluation (Signed)
Physical Therapy Evaluation Patient Details Name: [name] MRN: [id number] DOB: 1950/10/19 Today's Date: 12/05/2013   History of Present Illness  [name] was admitted for bilateral deep brain stimulator lead placement for dx Essential Tremor  Clinical Impression  Pt very pleasant with continued tremor of bil UE, impaired balance and decreased safety awareness. Pt educated for RW use, balance and plan. Pt will benefit from acute therapy to maximize balance, function, gait and independence to decrease burden of care.     Follow Up Recommendations Outpatient PT, neurorehabilitation    Equipment Recommendations  Rolling walker with 5" wheels;3in1 (PT)    Recommendations for Other Services OT consult     Precautions / Restrictions Precautions Precautions: Fall      Mobility  Bed Mobility Overal bed mobility: Modified Independent                Transfers Overall transfer level: Needs assistance   Transfers: Sit to/from Stand Sit to Stand: Supervision         General transfer comment: supervision for safety with decreased balance  Ambulation/Gait Ambulation/Gait assistance: Min guard Ambulation Distance (Feet): 600 Feet Assistive device: Rolling walker (2 wheeled) Gait Pattern/deviations: Step-through pattern;Decreased stride length;Shuffle   Gait velocity interpretation: Below normal speed for age/gender General Gait Details: generally unsteady gait with improved stabiltiy with use of RW, cues for position in RW, safety and directional cues  Stairs            Wheelchair Mobility    Modified Rankin (Stroke Patients Only)       Balance Overall balance assessment: Needs assistance   Sitting balance-Leahy Scale: Good       Standing balance-Leahy Scale: Fair                               Pertinent Vitals/Pain Pain Assessment: 0-10 Pain Score: 4  Pain Location: frontal lobe Pain Descriptors / Indicators: Aching Pain  Intervention(s): Repositioned;Premedicated before session    Home Living Family/patient expects to be discharged to:: Private residence Living Arrangements: Spouse/significant other Available Help at Discharge: Family;Available 24 hours/day Type of Home: House Home Access: Level entry     Home Layout: One level Home Equipment: None      Prior Function Level of Independence: Independent         Comments: spouse does the cooking and cleaning     Hand Dominance        Extremity/Trunk Assessment   Upper Extremity Assessment: Defer to OT evaluation (decreased fine motor)           Lower Extremity Assessment: Overall WFL for tasks assessed (decreased sensation L2 myotome LLE)      Cervical / Trunk Assessment: Normal  Communication   Communication: No difficulties  Cognition Arousal/Alertness: Awake/alert Behavior During Therapy: Flat affect Overall Cognitive Status: Impaired/Different from baseline Area of Impairment: Safety/judgement         Safety/Judgement: Decreased awareness of deficits          General Comments      Exercises        Assessment/Plan    PT Assessment Patient needs continued PT services  PT Diagnosis Abnormality of gait   PT Problem List Decreased balance;Decreased mobility;Decreased safety awareness;Decreased knowledge of use of DME  PT Treatment Interventions Gait training;Functional mobility training;Therapeutic activities;Balance training;DME instruction;Patient/family education   PT Goals (Current goals can be found in the Care Plan section) Acute Rehab PT Goals Patient  Stated Goal: return home  PT Goal Formulation: With patient Time For Goal Achievement: 12/12/13 Potential to Achieve Goals: Good    Frequency Min 5X/week   Barriers to discharge        Co-evaluation               End of Session Equipment Utilized During Treatment: Gait belt Activity Tolerance: Patient tolerated treatment well Patient left:  in chair;with call bell/phone within reach Nurse Communication: Mobility status;Precautions         Time: 7915-0569 PT Time Calculation (min): 18 min   Charges:   PT Evaluation $Initial PT Evaluation Tier I: 1 Procedure PT Treatments $Gait Training: 8-22 mins   PT G CodesMelford Aase 12/05/2013, 8:53 AM Elwyn Reach, [place]

## 2013-12-07 ENCOUNTER — Encounter (HOSPITAL_COMMUNITY): Payer: Self-pay | Admitting: Neurosurgery

## 2013-12-14 ENCOUNTER — Encounter (HOSPITAL_COMMUNITY): Payer: Self-pay | Admitting: *Deleted

## 2013-12-15 ENCOUNTER — Inpatient Hospital Stay (HOSPITAL_COMMUNITY): Payer: Medicare Other | Admitting: Anesthesiology

## 2013-12-15 ENCOUNTER — Encounter (HOSPITAL_COMMUNITY): Payer: Self-pay | Admitting: *Deleted

## 2013-12-15 ENCOUNTER — Encounter (HOSPITAL_COMMUNITY): Payer: Medicare Other | Admitting: Anesthesiology

## 2013-12-15 ENCOUNTER — Observation Stay (HOSPITAL_COMMUNITY)
Admission: RE | Admit: 2013-12-15 | Discharge: 2013-12-16 | Disposition: A | Discharge status: Home / Self Care | Payer: Medicare Other | Source: Ambulatory Visit | Attending: Neurosurgery | Admitting: Neurosurgery

## 2013-12-15 ENCOUNTER — Encounter (HOSPITAL_COMMUNITY)
Admission: RE | Disposition: A | Discharge status: Home / Self Care | Payer: Self-pay | Source: Ambulatory Visit | Attending: Neurosurgery

## 2013-12-15 DIAGNOSIS — Z7982 Long term (current) use of aspirin: Secondary | ICD-10-CM | POA: Diagnosis not present

## 2013-12-15 DIAGNOSIS — R251 Tremor, unspecified: Secondary | ICD-10-CM

## 2013-12-15 DIAGNOSIS — K219 Gastro-esophageal reflux disease without esophagitis: Secondary | ICD-10-CM | POA: Insufficient documentation

## 2013-12-15 DIAGNOSIS — F3289 Other specified depressive episodes: Secondary | ICD-10-CM | POA: Diagnosis not present

## 2013-12-15 DIAGNOSIS — Z794 Long term (current) use of insulin: Secondary | ICD-10-CM | POA: Diagnosis not present

## 2013-12-15 DIAGNOSIS — Z87891 Personal history of nicotine dependence: Secondary | ICD-10-CM | POA: Insufficient documentation

## 2013-12-15 DIAGNOSIS — J45909 Unspecified asthma, uncomplicated: Secondary | ICD-10-CM | POA: Insufficient documentation

## 2013-12-15 DIAGNOSIS — G252 Other specified forms of tremor: Principal | ICD-10-CM

## 2013-12-15 DIAGNOSIS — E119 Type 2 diabetes mellitus without complications: Secondary | ICD-10-CM | POA: Insufficient documentation

## 2013-12-15 DIAGNOSIS — Z9089 Acquired absence of other organs: Secondary | ICD-10-CM | POA: Insufficient documentation

## 2013-12-15 DIAGNOSIS — F329 Major depressive disorder, single episode, unspecified: Secondary | ICD-10-CM | POA: Insufficient documentation

## 2013-12-15 DIAGNOSIS — Z79899 Other long term (current) drug therapy: Secondary | ICD-10-CM | POA: Insufficient documentation

## 2013-12-15 DIAGNOSIS — I10 Essential (primary) hypertension: Secondary | ICD-10-CM | POA: Insufficient documentation

## 2013-12-15 DIAGNOSIS — G250 Essential tremor: Secondary | ICD-10-CM | POA: Diagnosis present

## 2013-12-15 HISTORY — PX: PULSE GENERATOR IMPLANT: SHX5370

## 2013-12-15 LAB — GLUCOSE, CAPILLARY
Glucose-Capillary: 282 mg/dL — ABNORMAL HIGH (ref 70–99)
Glucose-Capillary: 229 mg/dL — ABNORMAL HIGH (ref 70–99)
Glucose-Capillary: 215 mg/dL — ABNORMAL HIGH (ref 70–99)
Glucose-Capillary: 216 mg/dL — ABNORMAL HIGH (ref 70–99)
Glucose-Capillary: 220 mg/dL — ABNORMAL HIGH (ref 70–99)
Glucose-Capillary: 184 mg/dL — ABNORMAL HIGH (ref 70–99)

## 2013-12-15 LAB — CBC
HCT: 35.9 % — ABNORMAL LOW (ref 36.0–46.0)
Hemoglobin: 12.3 g/dL (ref 12.0–15.0)
MCH: 30.8 pg (ref 26.0–34.0)
MCHC: 34.3 g/dL (ref 30.0–36.0)
MCV: 90 fL (ref 78.0–100.0)
Platelets: 260 10*3/uL (ref 150–400)
RBC: 3.99 MIL/uL (ref 3.87–5.11)
RDW: 13.2 % (ref 11.5–15.5)
WBC: 6.9 10*3/uL (ref 4.0–10.5)

## 2013-12-15 LAB — BASIC METABOLIC PANEL
Anion gap: 14 (ref 5–15)
BUN: 13 mg/dL (ref 6–23)
CO2: 24 mEq/L (ref 19–32)
Calcium: 9.2 mg/dL (ref 8.4–10.5)
Chloride: 102 mEq/L (ref 96–112)
Creatinine, Ser: 0.69 mg/dL (ref 0.50–1.10)
GFR calc Af Amer: >90 mL/min (ref >90)
GFR calc non Af Amer: 89 mL/min — ABNORMAL LOW (ref >90)
Glucose, Bld: 305 mg/dL — ABNORMAL HIGH (ref 70–99)
Potassium: 4.3 mEq/L (ref 3.7–5.3)
Sodium: 140 mEq/L (ref 137–147)

## 2013-12-15 SURGERY — BILATERAL PULSE GENERATOR IMPLANT
Anesthesia: General | Site: Head | Laterality: Bilateral

## 2013-12-15 MED ORDER — ACETAMINOPHEN 650 MG RE SUPP: 650.0000 mg | RECTAL | Status: DC | PRN

## 2013-12-15 MED ORDER — NAPROXEN 500 MG PO TABS
500.0000 mg | ORAL_TABLET | Freq: Two times a day (BID) | ORAL | Status: DC | PRN
  Filled 2013-12-15: qty 1

## 2013-12-15 MED ORDER — NEOSTIGMINE METHYLSULFATE 10 MG/10ML IV SOLN
INTRAVENOUS | Status: DC | PRN
  Administered 2013-12-15: 3 mg via INTRAVENOUS

## 2013-12-15 MED ORDER — METOPROLOL SUCCINATE ER 50 MG PO TB24
50.0000 mg | ORAL_TABLET | Freq: Once | ORAL | Status: AC
  Administered 2013-12-15: 50 mg via ORAL
  Filled 2013-12-15: qty 1

## 2013-12-15 MED ORDER — BUPIVACAINE HCL (PF) 0.5 % IJ SOLN
INTRAMUSCULAR | Status: DC | PRN
Start: 2013-12-15 — End: 2013-12-15
  Administered 2013-12-15: 7 mL
  Administered 2013-12-15: 5 mL

## 2013-12-15 MED ORDER — DULOXETINE HCL 60 MG PO CPEP
60.0000 mg | ORAL_CAPSULE | Freq: Every day | ORAL | Status: DC
  Administered 2013-12-15 – 2013-12-16 (×2): 60 mg via ORAL
  Filled 2013-12-15 (×2): qty 1

## 2013-12-15 MED ORDER — SODIUM CHLORIDE 0.9 % IJ SOLN: 3.0000 mL | INTRAMUSCULAR | Status: DC | PRN

## 2013-12-15 MED ORDER — VANCOMYCIN HCL 1000 MG IV SOLR
INTRAVENOUS | Status: AC
  Filled 2013-12-15: qty 2000

## 2013-12-15 MED ORDER — BACITRACIN ZINC 500 UNIT/GM EX OINT
TOPICAL_OINTMENT | CUTANEOUS | Status: DC | PRN
  Administered 2013-12-15: 1 via TOPICAL

## 2013-12-15 MED ORDER — BISACODYL 10 MG RE SUPP: 10.0000 mg | Freq: Every day | RECTAL | Status: DC | PRN

## 2013-12-15 MED ORDER — FLEET ENEMA 7-19 GM/118ML RE ENEM
1.0000 | ENEMA | Freq: Once | RECTAL | Status: AC | PRN
  Filled 2013-12-15: qty 1

## 2013-12-15 MED ORDER — MIDAZOLAM HCL 2 MG/2ML IJ SOLN
INTRAMUSCULAR | Status: AC
  Filled 2013-12-15: qty 2

## 2013-12-15 MED ORDER — PHENOL 1.4 % MT LIQD: 1.0000 | OROMUCOSAL | Status: DC | PRN

## 2013-12-15 MED ORDER — INSULIN ASPART PROT & ASPART (70-30 MIX) 100 UNIT/ML ~~LOC~~ SUSP
40.0000 [IU] | Freq: Every day | SUBCUTANEOUS | Status: DC
  Administered 2013-12-15: 40 [IU] via SUBCUTANEOUS
  Filled 2013-12-15: qty 10

## 2013-12-15 MED ORDER — PROPOFOL 10 MG/ML IV BOLUS
INTRAVENOUS | Status: DC | PRN
  Administered 2013-12-15: 120 mg via INTRAVENOUS

## 2013-12-15 MED ORDER — NEOSTIGMINE METHYLSULFATE 10 MG/10ML IV SOLN
INTRAVENOUS | Status: AC
  Filled 2013-12-15: qty 1

## 2013-12-15 MED ORDER — MIDAZOLAM HCL 5 MG/5ML IJ SOLN
INTRAMUSCULAR | Status: DC | PRN
  Administered 2013-12-15: 1 mg via INTRAVENOUS

## 2013-12-15 MED ORDER — PHENYLEPHRINE HCL 10 MG/ML IJ SOLN
INTRAMUSCULAR | Status: DC | PRN
  Administered 2013-12-15: 80 ug via INTRAVENOUS

## 2013-12-15 MED ORDER — KCL IN DEXTROSE-NACL 20-5-0.45 MEQ/L-%-% IV SOLN
INTRAVENOUS | Status: DC
  Filled 2013-12-15 (×3): qty 1000

## 2013-12-15 MED ORDER — GLYCOPYRROLATE 0.2 MG/ML IJ SOLN
INTRAMUSCULAR | Status: DC | PRN
  Administered 2013-12-15: .6 mg via INTRAVENOUS

## 2013-12-15 MED ORDER — MORPHINE SULFATE 2 MG/ML IJ SOLN: 1.0000 mg | INTRAMUSCULAR | Status: DC | PRN

## 2013-12-15 MED ORDER — FLUTICASONE PROPIONATE 50 MCG/ACT NA SUSP
1.0000 | Freq: Every day | NASAL | Status: DC
  Filled 2013-12-15: qty 16

## 2013-12-15 MED ORDER — FENTANYL CITRATE 0.05 MG/ML IJ SOLN
INTRAMUSCULAR | Status: DC | PRN
  Administered 2013-12-15 (×3): 50 ug via INTRAVENOUS

## 2013-12-15 MED ORDER — SODIUM CHLORIDE 0.9 % IV SOLN: 250.0000 mL | INTRAVENOUS | Status: DC

## 2013-12-15 MED ORDER — FAMOTIDINE 20 MG PO TABS
20.0000 mg | ORAL_TABLET | Freq: Every day | ORAL | Status: DC
  Administered 2013-12-15 – 2013-12-16 (×2): 20 mg via ORAL
  Filled 2013-12-15 (×2): qty 1

## 2013-12-15 MED ORDER — SERTRALINE HCL 50 MG PO TABS
150.0000 mg | ORAL_TABLET | Freq: Every day | ORAL | Status: DC
  Administered 2013-12-15 – 2013-12-16 (×2): 150 mg via ORAL
  Filled 2013-12-15 (×2): qty 1

## 2013-12-15 MED ORDER — LACTATED RINGERS IV SOLN
INTRAVENOUS | Status: DC
  Administered 2013-12-15: 11:00:00 via INTRAVENOUS

## 2013-12-15 MED ORDER — GLYCOPYRROLATE 0.2 MG/ML IJ SOLN
INTRAMUSCULAR | Status: AC
  Filled 2013-12-15: qty 2

## 2013-12-15 MED ORDER — MEPERIDINE HCL 25 MG/ML IJ SOLN: 6.2500 mg | INTRAMUSCULAR | Status: DC | PRN

## 2013-12-15 MED ORDER — ONDANSETRON HCL 4 MG/2ML IJ SOLN: 4.0000 mg | INTRAMUSCULAR | Status: DC | PRN

## 2013-12-15 MED ORDER — SODIUM CHLORIDE 0.9 % IJ SOLN
3.0000 mL | Freq: Two times a day (BID) | INTRAMUSCULAR | Status: DC
  Administered 2013-12-15: 3 mL via INTRAVENOUS

## 2013-12-15 MED ORDER — HYDROCODONE-ACETAMINOPHEN 5-325 MG PO TABS: 1.0000 | ORAL_TABLET | ORAL | Status: DC | PRN

## 2013-12-15 MED ORDER — GABAPENTIN 300 MG PO CAPS
300.0000 mg | ORAL_CAPSULE | Freq: Every day | ORAL | Status: DC
  Administered 2013-12-15: 300 mg via ORAL
  Filled 2013-12-15 (×2): qty 1

## 2013-12-15 MED ORDER — LOSARTAN POTASSIUM 50 MG PO TABS
100.0000 mg | ORAL_TABLET | Freq: Every day | ORAL | Status: DC
  Administered 2013-12-16: 100 mg via ORAL
  Filled 2013-12-15 (×2): qty 2

## 2013-12-15 MED ORDER — ACETAMINOPHEN 325 MG PO TABS: 650.0000 mg | ORAL_TABLET | ORAL | Status: DC | PRN

## 2013-12-15 MED ORDER — EPHEDRINE SULFATE 50 MG/ML IJ SOLN
INTRAMUSCULAR | Status: DC | PRN
  Administered 2013-12-15 (×2): 10 mg via INTRAVENOUS

## 2013-12-15 MED ORDER — ROCURONIUM BROMIDE 50 MG/5ML IV SOLN
INTRAVENOUS | Status: AC
  Filled 2013-12-15: qty 1

## 2013-12-15 MED ORDER — LORAZEPAM 0.5 MG PO TABS: 1.0000 mg | ORAL_TABLET | Freq: Every day | ORAL | Status: DC | PRN

## 2013-12-15 MED ORDER — DOCUSATE SODIUM 100 MG PO CAPS
100.0000 mg | ORAL_CAPSULE | Freq: Two times a day (BID) | ORAL | Status: DC
  Administered 2013-12-15 – 2013-12-16 (×2): 100 mg via ORAL
  Filled 2013-12-15 (×3): qty 1

## 2013-12-15 MED ORDER — HYDROCODONE-ACETAMINOPHEN 5-325 MG PO TABS: 1.0000 | ORAL_TABLET | Freq: Four times a day (QID) | ORAL | Status: DC | PRN

## 2013-12-15 MED ORDER — CEFAZOLIN SODIUM 1-5 GM-% IV SOLN
1.0000 g | Freq: Three times a day (TID) | INTRAVENOUS | Status: AC
  Administered 2013-12-15 – 2013-12-16 (×2): 1 g via INTRAVENOUS
  Filled 2013-12-15 (×3): qty 50

## 2013-12-15 MED ORDER — ROCURONIUM BROMIDE 100 MG/10ML IV SOLN
INTRAVENOUS | Status: DC | PRN
  Administered 2013-12-15: 35 mg via INTRAVENOUS
  Administered 2013-12-15: 10 mg via INTRAVENOUS

## 2013-12-15 MED ORDER — LEVOTHYROXINE SODIUM 75 MCG PO TABS
75.0000 ug | ORAL_TABLET | Freq: Every day | ORAL | Status: DC
  Administered 2013-12-16: 75 ug via ORAL
  Filled 2013-12-15 (×2): qty 1

## 2013-12-15 MED ORDER — 0.9 % SODIUM CHLORIDE (POUR BTL) OPTIME
TOPICAL | Status: DC | PRN
  Administered 2013-12-15: 1000 mL

## 2013-12-15 MED ORDER — HYDROCODONE-ACETAMINOPHEN 5-325 MG PO TABS
1.0000 | ORAL_TABLET | ORAL | Status: DC | PRN
  Administered 2013-12-15 – 2013-12-16 (×2): 2 via ORAL
  Filled 2013-12-15 (×2): qty 2

## 2013-12-15 MED ORDER — ALBUTEROL SULFATE (2.5 MG/3ML) 0.083% IN NEBU: 2.5000 mg | INHALATION_SOLUTION | Freq: Four times a day (QID) | RESPIRATORY_TRACT | Status: DC | PRN

## 2013-12-15 MED ORDER — METOPROLOL SUCCINATE ER 50 MG PO TB24
50.0000 mg | ORAL_TABLET | Freq: Two times a day (BID) | ORAL | Status: DC
  Administered 2013-12-15 – 2013-12-16 (×2): 50 mg via ORAL
  Filled 2013-12-15 (×3): qty 1

## 2013-12-15 MED ORDER — VANCOMYCIN HCL 1000 MG IV SOLR
INTRAVENOUS | Status: DC | PRN
  Administered 2013-12-15: 1000 mg via TOPICAL

## 2013-12-15 MED ORDER — ZOLPIDEM TARTRATE 5 MG PO TABS: 5.0000 mg | ORAL_TABLET | Freq: Every evening | ORAL | Status: DC | PRN

## 2013-12-15 MED ORDER — PROPOFOL 10 MG/ML IV BOLUS
INTRAVENOUS | Status: AC
  Filled 2013-12-15: qty 20

## 2013-12-15 MED ORDER — INSULIN ASPART PROT & ASPART (70-30 MIX) 100 UNIT/ML ~~LOC~~ SUSP
90.0000 [IU] | Freq: Every day | SUBCUTANEOUS | Status: DC
  Administered 2013-12-16: 90 [IU] via SUBCUTANEOUS
  Filled 2013-12-15: qty 10

## 2013-12-15 MED ORDER — ARTIFICIAL TEARS OP OINT
TOPICAL_OINTMENT | OPHTHALMIC | Status: AC
  Filled 2013-12-15: qty 3.5

## 2013-12-15 MED ORDER — LACTATED RINGERS IV SOLN
INTRAVENOUS | Status: DC | PRN
  Administered 2013-12-15 (×3): via INTRAVENOUS

## 2013-12-15 MED ORDER — ONDANSETRON HCL 4 MG/2ML IJ SOLN
INTRAMUSCULAR | Status: AC
  Filled 2013-12-15: qty 2

## 2013-12-15 MED ORDER — MENTHOL 3 MG MT LOZG: 1.0000 | LOZENGE | OROMUCOSAL | Status: DC | PRN

## 2013-12-15 MED ORDER — FENTANYL CITRATE 0.05 MG/ML IJ SOLN: 25.0000 ug | INTRAMUSCULAR | Status: DC | PRN

## 2013-12-15 MED ORDER — ASPIRIN EC 81 MG PO TBEC
81.0000 mg | DELAYED_RELEASE_TABLET | Freq: Every morning | ORAL | Status: DC
  Administered 2013-12-16: 81 mg via ORAL
  Filled 2013-12-15: qty 1

## 2013-12-15 MED ORDER — CEFAZOLIN SODIUM-DEXTROSE 2-3 GM-% IV SOLR
INTRAVENOUS | Status: AC
  Filled 2013-12-15: qty 50

## 2013-12-15 MED ORDER — PRIMIDONE 50 MG PO TABS
150.0000 mg | ORAL_TABLET | Freq: Two times a day (BID) | ORAL | Status: DC
  Administered 2013-12-15 – 2013-12-16 (×2): 150 mg via ORAL
  Filled 2013-12-15 (×3): qty 3

## 2013-12-15 MED ORDER — LIDOCAINE-EPINEPHRINE 1 %-1:100000 IJ SOLN
INTRAMUSCULAR | Status: DC | PRN
  Administered 2013-12-15: 5 mL
  Administered 2013-12-15: 7 mL

## 2013-12-15 MED ORDER — CEFAZOLIN SODIUM-DEXTROSE 2-3 GM-% IV SOLR
INTRAVENOUS | Status: DC | PRN
  Administered 2013-12-15: 2 g via INTRAVENOUS

## 2013-12-15 MED ORDER — POLYETHYLENE GLYCOL 3350 17 G PO PACK
17.0000 g | PACK | Freq: Every day | ORAL | Status: DC | PRN
  Filled 2013-12-15: qty 1

## 2013-12-15 MED ORDER — FENTANYL CITRATE 0.05 MG/ML IJ SOLN
INTRAMUSCULAR | Status: AC
  Filled 2013-12-15: qty 5

## 2013-12-15 MED ORDER — ONDANSETRON HCL 4 MG/2ML IJ SOLN
INTRAMUSCULAR | Status: DC | PRN
Start: 2013-12-15 — End: 2013-12-15
  Administered 2013-12-15: 4 mg via INTRAVENOUS

## 2013-12-15 MED ORDER — ALBUTEROL SULFATE HFA 108 (90 BASE) MCG/ACT IN AERS: 2.0000 | INHALATION_SPRAY | Freq: Four times a day (QID) | RESPIRATORY_TRACT | Status: DC | PRN

## 2013-12-15 MED ORDER — LIDOCAINE HCL (CARDIAC) 20 MG/ML IV SOLN
INTRAVENOUS | Status: AC
  Filled 2013-12-15: qty 5

## 2013-12-15 SURGICAL SUPPLY — 54 items
CANISTER SUCT 3000ML (MISCELLANEOUS) ×2 IMPLANT
CORDS BIPOLAR (ELECTRODE) ×2 IMPLANT
DERMABOND ADHESIVE PROPEN (GAUZE/BANDAGES/DRESSINGS) ×2
DERMABOND ADVANCED .7 DNX6 (GAUZE/BANDAGES/DRESSINGS) ×2 IMPLANT
DRAPE CAMERA VIDEO/LASER (DRAPES) ×4 IMPLANT
DRAPE INCISE IOBAN 66X45 STRL (DRAPES) ×2 IMPLANT
DRAPE ORTHO SPLIT 77X108 STRL (DRAPES) ×2
DRAPE POUCH INSTRU U-SHP 10X18 (DRAPES) ×4 IMPLANT
DRAPE SURG ORHT 6 SPLT 77X108 (DRAPES) ×2 IMPLANT
DRSG OPSITE 4X5.5 SM (GAUZE/BANDAGES/DRESSINGS) ×4 IMPLANT
DRSG OPSITE POSTOP 4X6 (GAUZE/BANDAGES/DRESSINGS) ×4 IMPLANT
DRSG TELFA 3X8 NADH (GAUZE/BANDAGES/DRESSINGS) ×4 IMPLANT
DURAPREP 26ML APPLICATOR (WOUND CARE) ×6 IMPLANT
ELECT CAUTERY BLADE 6.4 (BLADE) ×4 IMPLANT
ELECT REM PT RETURN 9FT ADLT (ELECTROSURGICAL) ×2
ELECTRODE REM PT RTRN 9FT ADLT (ELECTROSURGICAL) ×1 IMPLANT
GAUZE SPONGE 4X4 16PLY XRAY LF (GAUZE/BANDAGES/DRESSINGS) ×4 IMPLANT
GLOVE BIO SURGEON STRL SZ8 (GLOVE) ×4 IMPLANT
GLOVE BIOGEL PI IND STRL 8 (GLOVE) ×4 IMPLANT
GLOVE BIOGEL PI IND STRL 8.5 (GLOVE) ×2 IMPLANT
GLOVE BIOGEL PI INDICATOR 8 (GLOVE) ×4
GLOVE BIOGEL PI INDICATOR 8.5 (GLOVE) ×2
GLOVE ECLIPSE 8.0 STRL XLNG CF (GLOVE) ×4 IMPLANT
GLOVE EXAM NITRILE LRG STRL (GLOVE) IMPLANT
GLOVE EXAM NITRILE MD LF STRL (GLOVE) IMPLANT
GLOVE EXAM NITRILE XL STR (GLOVE) IMPLANT
GLOVE EXAM NITRILE XS STR PU (GLOVE) IMPLANT
GOWN STRL REUS W/ TWL LRG LVL3 (GOWN DISPOSABLE) ×2 IMPLANT
GOWN STRL REUS W/ TWL XL LVL3 (GOWN DISPOSABLE) ×1 IMPLANT
GOWN STRL REUS W/TWL 2XL LVL3 (GOWN DISPOSABLE) ×6 IMPLANT
GOWN STRL REUS W/TWL LRG LVL3 (GOWN DISPOSABLE) ×2
GOWN STRL REUS W/TWL XL LVL3 (GOWN DISPOSABLE) ×1
KIT BASIN OR (CUSTOM PROCEDURE TRAY) ×2 IMPLANT
KIT ROOM TURNOVER OR (KITS) ×2 IMPLANT
NEEDLE HYPO 25X1 1.5 SAFETY (NEEDLE) ×2 IMPLANT
NEUROSTIM OCTOPOLAR ~~LOC~~ 60X55 (Neuro Prosthesis/Implant) ×4 IMPLANT
NEUROSTIM PROGRAMMER 2.2X3.7 (Neuro Prosthesis/Implant) ×2 IMPLANT
NS IRRIG 1000ML POUR BTL (IV SOLUTION) ×2 IMPLANT
PACK EENT II TURBAN DRAPE (CUSTOM PROCEDURE TRAY) ×2 IMPLANT
PACK LAMINECTOMY NEURO (CUSTOM PROCEDURE TRAY) ×2 IMPLANT
PAD ARMBOARD 7.5X6 YLW CONV (MISCELLANEOUS) ×6 IMPLANT
PATIENT PROGRAMMER ×2 IMPLANT
PENCIL BUTTON HOLSTER BLD 10FT (ELECTRODE) ×2 IMPLANT
STAPLER SKIN PROX WIDE 3.9 (STAPLE) ×2 IMPLANT
SUT ETHILON 3 0 PS 1 (SUTURE) ×2 IMPLANT
SUT SILK 2 0 TIES 10X30 (SUTURE) ×2 IMPLANT
SUT VIC AB 2-0 CP2 18 (SUTURE) ×6 IMPLANT
SUT VIC AB 3-0 SH 8-18 (SUTURE) ×6 IMPLANT
TOOL TUNNELING (INSTRUMENTS) ×4 IMPLANT
TOWEL OR 17X24 6PK STRL BLUE (TOWEL DISPOSABLE) ×4 IMPLANT
TOWEL OR 17X26 10 PK STRL BLUE (TOWEL DISPOSABLE) ×4 IMPLANT
UNDERPAD 30X30 INCONTINENT (UNDERPADS AND DIAPERS) ×2 IMPLANT
WATER STERILE IRR 1000ML POUR (IV SOLUTION) ×2 IMPLANT
extension kit ×4 IMPLANT

## 2013-12-15 NOTE — Progress Notes (Signed)
Spoke with Dr.Manny about pt being instructed not to take Metoprolol;orders received to give it to her with a sip of water

## 2013-12-15 NOTE — H&P (Signed)
[facility] [place], [place] [address] Phone: [phone]   Patient ID:   [id number] Patient: [name]  Date of Birth: [birth year] Visit Type: Office Visit   Date: 09/18/2013 11:30 AM Provider: Marchia Meiers. Vertell Limber MD   This 63 year old female presents for Tremor.  History of Present Illness: 1.  Tremor  Patient is a delightful 63 year old woman who works as an Engineering geologist at [employer] with comorbidities of GERD, insulin-dependent diabetes mellitus and asthma who has been diagnosed with essential tremor since the 1960s.  She says her mother had this as well as did her father.  She has problems with tremulousness of speech and head tremor as well as right greater than left upper extremity tremor.  The patient says that the tremor is now quite disabling to her and is interfering with her ability to function.  She has visited with Dr. Kingsley Callander Occasions.  She Is Felt to Be a Good Candidate for VIM Deep Brain Stimulation.  The patient wishes to go ahead with bilateral stimulator placement but if can only be approved on a unilateral basis she would like it done on the left for right handed symptoms.  She is reviewed EVD and met with the [employer] representative and has decided she wishes a good with surgery and comes to discuss this in detail and review the surgical procedure with me.        PAST MEDICAL/SURGICAL HISTORY   (Detailed)  Disease/disorder Onset Date Management Date Comments    Cholecystectomy 1991     Partial hysterectomy      C-section    Anxiety      Arthritis      Asthma      Depression      Diabetes mellitus      Thyroid disease          PAST MEDICAL HISTORY, SURGICAL HISTORY, FAMILY HISTORY, SOCIAL HISTORY AND REVIEW OF SYSTEMS I have reviewed the patient's past medical, surgical, family and social history as well as the comprehensive review of systems as included on the [facility] history form dated  09/18/2013, which I have signed.  Family History  (Detailed)  Relationship Family Member Name Deceased Age at Death Condition Onset Age Cause of Death      Family history of Diabetes mellitus  N   SOCIAL HISTORY  (Detailed) Tobacco use reviewed. Preferred language is Unknown.   Smoking status: Former smoker.  SMOKING STATUS Use Status Type Smoking Status Usage Per Day Years Used Total Pack Years  yes  Former smoker       HOME ENVIRONMENT/SAFETY The patient has not fallen in the last year.        MEDICATIONS(added, continued or stopped this visit):   Started Medication Directions Instruction Stopped   Advair HFA 115 mcg-21 mcg/actuation aerosol inhaler inhale 2 puff by inhalation route 2 times every day in the morning and evening     albuterol sulfate 2.5 mg/3 mL (0.083 %) solution for nebulization inhale 3 milliliters by nebulization route  every 6 hours as needed for wheezing or shortness of breath     aspirin 81 mg tablet,delayed release take 1 tablet by oral route  every day     Benicar 40 mg tablet take 1 tablet by oral route  every day     cyclobenzaprine 10 mg tablet take 0.5 tablet by oral route 3 times every day as needed     diphenhydramine 25 mg tablet take  1 tablet by oral route  every day at bedtime as needed     duloxetine 60 mg capsule,delayed release take 1 capsule by oral route  every day     Humulin N 100 unit/mL subcutaneous suspension Inject 90 units each morning and 20 units in the evening     levothyroxine 75 mcg tablet take 1 tablet by oral route  every day     lorazepam 1 mg tablet take 1 tablet by oral route 3 times every day as needed     metoprolol succinate ER 50 mg tablet,extended release 24 hr take 1 tablet by oral route 2 times every day     naproxen 500 mg tablet Take as directed     Nasonex 50 mcg/actuation Spray spray 2 spray by intranasal route  every day in each nostril     primidone 50 mg tablet take 3 tablet by oral route 2 times every  day     ProAir HFA 90 mcg/actuation aerosol inhaler inhale 2 puff by inhalation route  every 6 hours as needed     promethazine-codeine 6.25 mg-10 mg/5 mL syrup take 5 milliliter by oral route 2 times every day as needed for cough     ranitidine 150 mg tablet take 1 tablet by oral route  every day     sertraline 100 mg tablet take 1.5 tablets by oral route  every day      ALLERGIES:   Vitals Date Temp F BP Pulse Ht In Wt Lb BMI BSA Pain Score  09/18/2013  134/81 92 64.5 193 32.62       PHYSICAL EXAM General Level of Distress: no acute distress Overall Appearance: normal  Head and Face  Right Left  Fundoscopic Exam:  normal normal    Cardiovascular Cardiac: regular rate and rhythm without murmur  Right Left  Carotid Pulses: normal normal  Respiratory Lungs: clear to auscultation  Neurological Orientation: normal Recent and Remote Memory: normal Attention Span and Concentration:   normal Language: normal Fund of Knowledge: normal  Right Left Sensation: normal normal Upper Extremity Coordination: normal normal  Lower Extremity Coordination: normal normal  Musculoskeletal Gait and Station: normal  Right Left Upper Extremity Muscle Strength: normal normal Lower Extremity Muscle Strength: normal normal Upper Extremity Muscle Tone:  normal normal Lower Extremity Muscle Tone: normal normal  Motor Strength Upper and lower extremity motor strength was tested in the clinically pertinent muscles.     Deep Tendon Reflexes  Right Left Biceps: normal normal Triceps: normal normal Brachiloradialis: normal normal Patellar: normal normal Achilles: normal normal  Cranial Nerves II. Optic Nerve/Visual Fields: normal III. Oculomotor: normal IV. Trochlear: normal V. Trigeminal: normal VI. Abducens: normal VII. Facial: normal VIII. Acoustic/Vestibular: normal IX. Glossopharyngeal: normal X. Vagus: normal XI. Spinal Accessory: normal XII.  Hypoglossal: normal  Motor and other Tests Lhermittes: negative Rhomberg: negative Pronator drift: absent     Right Left Hoffman's: normal normal Clonus: normal normal Babinski: normal normal   Additional Findings:  Patient has significant tremor both of her head as well as tremulous speech and right greater than left upper extremity tremors per patient although to my examination the left upper extremity appears to be more affected today than the right.  She says generally it is much more bothersome on the right than the left.  Her writing was assessed and is clearly affected by tremor.      IMPRESSION Severe bilateral upper extremity greater than lower extremity tremor due to essential tremor.  Patient  wishes to proceed with bilateral VIM DBS placement for tremor.  Completed Orders (this encounter) Order Details Reason Side Interpretation Result Initial Treatment Date Region  Lifestyle education regarding diet Encouraged to eat a well balanced diet and follow up with primary care physician.         Assessment/Plan # Detail Type Description   1. Assessment Tremor (781.0).       2. Assessment Diabetes (250.00).       3. Assessment BMI 32.0-32.9,ADULT (V85.32).   Plan Orders Today's instructions / counseling include(s) Lifestyle education regarding diet.         Fall Risk Plan The patient has not fallen in the last year.  Proceed with Star fix fiducial placement in preparation for bilateral VIM DBS surgery.  Risks and benefits were discussed in detail with the patient and she wishes to proceed  Orders: Instruction(s)/Education: Assessment Instruction  V85.32 Lifestyle education regarding diet             Provider:  Marchia Meiers. Vertell Limber MD  09/24/2013 03:13 PM Dictation edited by: Marchia Meiers. Vertell Limber    CC Providers: [name] [address] [place], [place]  [id number] ----------------------------------------------------------------------------------------------------------------------------------------------------------------------         Electronically signed by Marchia Meiers Vertell Limber MD on 09/24/2013 03:13 PM

## 2013-12-15 NOTE — Brief Op Note (Signed)
12/15/2013  4:59 PM  PATIENT:  [name]  63 y.o. female  PRE-OPERATIVE DIAGNOSIS:  Tremor, s/p Stage I DBS Bilateral VIM stimulator placement  POST-OPERATIVE DIAGNOSIS:  Tremor, s/p Stage I DBS Bilateral VIM stimulator placement   PROCEDURE:  Procedure(s) with comments: BILATERAL PULSE GENERATOR IMPLANT (Bilateral) - BILATERAL with extensions  SURGEON:  Surgeon(s) and Role:    * [name], MD - Primary  PHYSICIAN ASSISTANT:   ASSISTANTS: Poteat, RN   ANESTHESIA:   general  EBL:  Total I/O In: 1500 [I.V.:1500] Out: -   BLOOD ADMINISTERED:none  DRAINS: none   LOCAL MEDICATIONS USED:  LIDOCAINE   SPECIMEN:  No Specimen  DISPOSITION OF SPECIMEN:  N/A  COUNTS:  YES  TOURNIQUET:  * No tourniquets in log *  DICTATION: Patient has implanted bilateral subthalamic stimulator electrodes having recently completed DBS Stage I and now presents for placement of lead extensions and IPG implantation.  PROCEDURE: Patient was brought to the operating room and GETA anesthesia was induced.  Right upper chest, scalp, neck were prepped with betadine scrub and Duraprep.  Area of planned incision was infiltrated with lidocaine.  Scalp incision was made and the lead extensions were exposed. An incision was made in the right upper chest and a pocket was created.  Extension tunnel was made from scalp to pocket.  [place] IPG was placed and attached to lead extension, which in turn were connected to cranial lead and torqued appropriately.  Covering boots was placed.  The IPG  was placed in the pocket.  Wounds were irrigated with vancomycin.  Incisions were closed with 2-0 Vicryl and 3-0 vicryl sutures at the pocket and 2-0 vicryl at the scalp with staples. and dressed with a sterile occlusive dressing.  The patient was then repositioned and the left sided lead and IPG were placed. Left upper chest, scalp, neck were prepped with betadine scrub and Duraprep.  Area of planned incision was infiltrated  with lidocaine.  Scalp incision was made and the lead extension was exposed. An incision was made in the left upper chest and a pocket was created.  Extension tunnel was made from scalp to pocket.  [place] IPG was placed and attached to lead extension, which in turn were connected to cranial lead and torqued appropriately.  Covering boots was placed.  The IPG  was placed in the pocket.  Wounds were irrigated with vancomycin.  Incisions were closed with 2-0 Vicryl and 3-0 vicryl sutures at the pocket and 2-0 vicryl at the scalp with staples. and dressed with a sterile occlusive dressing. Counts were correct at the end of the case.  PLAN OF CARE: Admit for overnight observation  PATIENT DISPOSITION:  PACU - hemodynamically stable.   Delay start of Pharmacological VTE agent (>24hrs) due to surgical blood loss or risk of bleeding: yes

## 2013-12-15 NOTE — H&P (View-Only) (Signed)
Awake, alert, conversant.  Doing well.  Baseline tremor.

## 2013-12-15 NOTE — Transfer of Care (Signed)
Immediate Anesthesia Transfer of Care Note  Patient: [name]  Procedure(s) Performed: Procedure(s) with comments: BILATERAL PULSE GENERATOR IMPLANT (Bilateral) - BILATERAL  Patient Location: PACU  Anesthesia Type:General  Level of Consciousness: awake, alert  and oriented  Airway & Oxygen Therapy: Patient Spontanous Breathing and Patient connected to nasal cannula oxygen  Post-op Assessment: Report given to PACU RN and Post -op Vital signs reviewed and stable  Post vital signs: Reviewed and stable  Complications: No apparent anesthesia complications

## 2013-12-15 NOTE — Progress Notes (Signed)
Dr.Tat's office told pt not to take her Metoprolol this morning

## 2013-12-15 NOTE — Plan of Care (Signed)
Problem: Consults Goal: Diagnosis - Spinal Surgery Outcome: Completed/Met Date Met:  12/15/13 BILATERAL PULSE GENERATOR IMPLANT.

## 2013-12-15 NOTE — Progress Notes (Signed)
Awake, alert, conversant.  MAEW.  Doing well.  

## 2013-12-15 NOTE — Op Note (Signed)
12/15/2013  4:59 PM  PATIENT:  [name]  63 y.o. female  PRE-OPERATIVE DIAGNOSIS:  Tremor, s/p Stage I DBS Bilateral VIM stimulator placement  POST-OPERATIVE DIAGNOSIS:  Tremor, s/p Stage I DBS Bilateral VIM stimulator placement   PROCEDURE:  Procedure(s) with comments: BILATERAL PULSE GENERATOR IMPLANT (Bilateral) - BILATERAL with extensions  SURGEON:  Surgeon(s) and Role:    * [name], MD - Primary  PHYSICIAN ASSISTANT:   ASSISTANTS: Poteat, RN   ANESTHESIA:   general  EBL:  Total I/O In: 1500 [I.V.:1500] Out: -   BLOOD ADMINISTERED:none  DRAINS: none   LOCAL MEDICATIONS USED:  LIDOCAINE   SPECIMEN:  No Specimen  DISPOSITION OF SPECIMEN:  N/A  COUNTS:  YES  TOURNIQUET:  * No tourniquets in log *  DICTATION: Patient has implanted bilateral subthalamic stimulator electrodes having recently completed DBS Stage I and now presents for placement of lead extensions and IPG implantation.  PROCEDURE: Patient was brought to the operating room and GETA anesthesia was induced.  Right upper chest, scalp, neck were prepped with betadine scrub and Duraprep.  Area of planned incision was infiltrated with lidocaine.  Scalp incision was made and the lead extensions were exposed. An incision was made in the right upper chest and a pocket was created.  Extension tunnel was made from scalp to pocket.  [place] IPG was placed and attached to lead extension, which in turn were connected to cranial lead and torqued appropriately.  Covering boots was placed.  The IPG  was placed in the pocket.  Wounds were irrigated with vancomycin.  Incisions were closed with 2-0 Vicryl and 3-0 vicryl sutures at the pocket and 2-0 vicryl at the scalp with staples. and dressed with a sterile occlusive dressing.  The patient was then repositioned and the left sided lead and IPG were placed. Left upper chest, scalp, neck were prepped with betadine scrub and Duraprep.  Area of planned incision was infiltrated  with lidocaine.  Scalp incision was made and the lead extension was exposed. An incision was made in the left upper chest and a pocket was created.  Extension tunnel was made from scalp to pocket.  [place] IPG was placed and attached to lead extension, which in turn were connected to cranial lead and torqued appropriately.  Covering boots was placed.  The IPG  was placed in the pocket.  Wounds were irrigated with vancomycin.  Incisions were closed with 2-0 Vicryl and 3-0 vicryl sutures at the pocket and 2-0 vicryl at the scalp with staples. and dressed with a sterile occlusive dressing. Counts were correct at the end of the case.  PLAN OF CARE: Admit for overnight observation  PATIENT DISPOSITION:  PACU - hemodynamically stable.   Delay start of Pharmacological VTE agent (>24hrs) due to surgical blood loss or risk of bleeding: yes

## 2013-12-15 NOTE — Anesthesia Procedure Notes (Signed)
Procedure Name: Intubation Date/Time: 12/15/2013 2:41 PM Performed by: Eligha Bridegroom Pre-anesthesia Checklist: Patient identified, Timeout performed, Emergency Drugs available, Suction available and Patient being monitored Patient Re-evaluated:Patient Re-evaluated prior to inductionOxygen Delivery Method: Circle system utilized Preoxygenation: Pre-oxygenation with 100% oxygen Intubation Type: IV induction Ventilation: Mask ventilation without difficulty Laryngoscope Size: Mac and 4 Grade View: Grade II Number of attempts: 1 Airway Equipment and Method: Stylet Placement Confirmation: ETT inserted through vocal cords under direct vision,  breath sounds checked- equal and bilateral and positive ETCO2 Secured at: 21 cm Tube secured with: Tape Dental Injury: Teeth and Oropharynx as per pre-operative assessment

## 2013-12-15 NOTE — Anesthesia Preprocedure Evaluation (Addendum)
Anesthesia Evaluation  Patient identified by MRN, date of birth, ID band Patient awake    Reviewed: H&P , NPO status , Patient's Chart, lab work & pertinent test results  Airway Mallampati: II TM Distance: >3 FB     Dental  (+) Dental Advisory Given, Poor Dentition   Pulmonary asthma (inhaler about once per week) , former smoker,  breath sounds clear to auscultation        Cardiovascular hypertension, Pt. on medications Dysrhythmias: ablation in past. Rhythm:Regular Rate:Normal  ECHO 2013  EF 65%   Neuro/Psych Anxiety Depression    GI/Hepatic GERD-  Medicated,  Endo/Other  diabetes, Poorly Controlled, Type 2, Insulin Dependent  Renal/GU      Musculoskeletal   Abdominal (+) + obese,   Peds  Hematology   Anesthesia Other Findings   Reproductive/Obstetrics                          Anesthesia Physical Anesthesia Plan  ASA: II  Anesthesia Plan:    Post-op Pain Management:    Induction:   Airway Management Planned:   Additional Equipment:   Intra-op Plan:   Post-operative Plan:   Informed Consent:   Plan Discussed with:   Anesthesia Plan Comments:         Anesthesia Quick Evaluation

## 2013-12-15 NOTE — Interval H&P Note (Signed)
History and Physical Interval Note:  12/15/2013 7:33 AM  [name]  has presented today for surgery, with the diagnosis of Tremor  The various methods of treatment have been discussed with the patient and family. After consideration of risks, benefits and other options for treatment, the patient has consented to  Procedure(s) with comments: BILATERAL PULSE GENERATOR IMPLANT (Bilateral) - BILATERAL as a surgical intervention .  The patient's history has been reviewed, patient examined, no change in status, stable for surgery.  I have reviewed the patient's chart and labs.  Questions were answered to the patient's satisfaction.     Kyston Gonce D

## 2013-12-16 DIAGNOSIS — G252 Other specified forms of tremor: Secondary | ICD-10-CM | POA: Diagnosis not present

## 2013-12-16 DIAGNOSIS — G250 Essential tremor: Secondary | ICD-10-CM | POA: Diagnosis not present

## 2013-12-16 LAB — GLUCOSE, CAPILLARY: Glucose-Capillary: 181 mg/dL — ABNORMAL HIGH (ref 70–99)

## 2013-12-16 MED ORDER — HYDROCODONE-ACETAMINOPHEN 5-325 MG PO TABS: 1.0000 | ORAL_TABLET | ORAL | Status: DC | PRN

## 2013-12-16 NOTE — Discharge Summary (Signed)
Physician Discharge Summary  Patient ID: [name] MRN: [id number] DOB/AGE: [birth year] 65 y.o.  Admit date: 12/15/2013 Discharge date: 12/16/2013  Admission Diagnoses: Tremor    Discharge Diagnoses: Tremor   Discharged Condition: good  Hospital Course: The patient was admitted on 12/15/2013 and taken to the operating room where the patient underwent battery placement. The patient tolerated the procedure well and was taken to the recovery room and then to the floor in stable condition. The hospital course was routine. There were no complications. The wound remained clean dry and intact. Pt had appropriate head and chest soreness. No complaints of arm pain or new N/T/W. The patient remained afebrile with stable vital signs, and tolerated a regular diet. The patient continued to increase activities, and pain was well controlled with oral pain medications.   Consults: None  Significant Diagnostic Studies:  Results for orders placed during the hospital encounter of 12/15/13  CBC      Result Value Ref Range   WBC 6.9  4.0 - 10.5 K/uL   RBC 3.99  3.87 - 5.11 MIL/uL   Hemoglobin 12.3  12.0 - 15.0 g/dL   HCT 35.9 (*) 36.0 - 46.0 %   MCV 90.0  78.0 - 100.0 fL   MCH 30.8  26.0 - 34.0 pg   MCHC 34.3  30.0 - 36.0 g/dL   RDW 13.2  11.5 - 15.5 %   Platelets 260  150 - 400 K/uL  BASIC METABOLIC PANEL      Result Value Ref Range   Sodium 140  137 - 147 mEq/L   Potassium 4.3  3.7 - 5.3 mEq/L   Chloride 102  96 - 112 mEq/L   CO2 24  19 - 32 mEq/L   Glucose, Bld 305 (*) 70 - 99 mg/dL   BUN 13  6 - 23 mg/dL   Creatinine, Ser 0.69  0.50 - 1.10 mg/dL   Calcium 9.2  8.4 - 10.5 mg/dL   GFR calc non Af Amer 89 (*) >90 mL/min   GFR calc Af Amer >90  >90 mL/min   Anion gap 14  5 - 15  GLUCOSE, CAPILLARY      Result Value Ref Range   Glucose-Capillary 282 (*) 70 - 99 mg/dL  GLUCOSE, CAPILLARY      Result Value Ref Range   Glucose-Capillary 229 (*) 70 - 99 mg/dL  GLUCOSE, CAPILLARY   Result Value Ref Range   Glucose-Capillary 215 (*) 70 - 99 mg/dL  GLUCOSE, CAPILLARY      Result Value Ref Range   Glucose-Capillary 216 (*) 70 - 99 mg/dL   Comment 1 Notify RN    GLUCOSE, CAPILLARY      Result Value Ref Range   Glucose-Capillary 220 (*) 70 - 99 mg/dL  GLUCOSE, CAPILLARY      Result Value Ref Range   Glucose-Capillary 184 (*) 70 - 99 mg/dL  GLUCOSE, CAPILLARY      Result Value Ref Range   Glucose-Capillary 181 (*) 70 - 99 mg/dL   Comment 1 Documented in Chart      Dg Chest 2 View  11/20/2013   CLINICAL DATA:  Pre-admission, hypertension  EXAM: CHEST  2 VIEW  COMPARISON:  04/06/2013  FINDINGS: Cardiomediastinal silhouette is stable. There is elevation of the right hemidiaphragm. Right basilar atelectasis or infiltrate. No pulmonary edema. Mild degenerative changes thoracic spine.  IMPRESSION: Elevation of the right hemidiaphragm. Right basilar atelectasis or infiltrate. No pulmonary edema.   Electronically Signed   By:  Lahoma Crocker M.D.   On: 11/20/2013 10:50   Ct Head Wo Contrast  11/21/2013   CLINICAL DATA:  Essential tremor  EXAM: CT HEAD WITHOUT CONTRAST  TECHNIQUE: Contiguous axial images were obtained from the base of the skull through the vertex without intravenous contrast.  COMPARISON:  MRI 08/23/2013.  CT 04/05/2013  FINDINGS: Metal fiducial markers were placed into the frontal and parietal bone bilaterally prior to the scan. No intracranial extension.  Cerebral volume normal for age. Age-appropriate atrophy is present. Mild chronic microvascular ischemia in the white matter.  Negative for acute infarct.  Negative for hemorrhage or mass.  IMPRESSION: Satisfactory placement of frontal and parietal fiducial markers bilaterally.  Mild atrophy and mild chronic microvascular ischemia. No acute intracranial abnormality.   Electronically Signed   By: Franchot Gallo M.D.   On: 11/21/2013 13:34   Mr Brain Wo Contrast  12/05/2013   CLINICAL DATA:  63 year old female status post  deep brain stimulator lead placement. Subsequent encounter.  EXAM: MRI HEAD WITHOUT CONTRAST  TECHNIQUE: Multiplanar, multiecho pulse sequences of the brain and surrounding structures were obtained without intravenous contrast.  COMPARISON:  Preoperative Head CT 11/21/2013. Preoperative Brain MRI 08/23/2013.  FINDINGS: Susceptibility artifact around the calvarium which may correspond to fiducials and/or other scalp hardware.  Small burr holes at the vertex with bilateral deep brain stimulator leads tracking to the subthalamic region bilaterally. No signal changes along their course to suggest hemorrhage. No associated mass effect. And otherwise stable cerebral morphology compared to 08/23/13.  IMPRESSION: Satisfactory postoperative appearance of the brain status post bilateral deep brain stimulator electrodes.   Electronically Signed   By: Lars Pinks M.D.   On: 12/05/2013 16:53    Antibiotics:  Anti-infectives   Start     Dose/Rate Route Frequency Ordered Stop   12/15/13 2000  ceFAZolin (ANCEF) IVPB 1 g/50 mL premix     1 g 100 mL/hr over 30 Minutes Intravenous Every 8 hours 12/15/13 1800 12/16/13 0606   12/15/13 1540  vancomycin (VANCOCIN) powder  Status:  Discontinued       As needed 12/15/13 1541 12/15/13 1708   12/15/13 1413  vancomycin (VANCOCIN) 1000 MG powder    Comments:  Loreli Dollar   : cabinet override      12/15/13 1413 12/16/13 0229      Discharge Exam: Blood pressure 140/77, pulse 94, temperature 98.7 F (37.1 C), temperature source Oral, resp. rate 18, height 5' 4.5" (1.638 m), weight 87.091 kg (192 lb), SpO2 93.00%. Neurologic: Grossly normal with stable tremor Incision clean down into  Discharge Medications:     Medication List         albuterol 108 (90 BASE) MCG/ACT inhaler  Commonly known as:  PROAIR HFA  Inhale 2 puffs into the lungs every 6 (six) hours as needed for wheezing or shortness of breath.     albuterol (2.5 MG/3ML) 0.083% nebulizer solution  Commonly  known as:  PROVENTIL  Take 2.5 mg by nebulization every 6 (six) hours as needed for wheezing or shortness of breath.     aspirin EC 81 MG tablet  Take 81 mg by mouth every morning.     DULoxetine 60 MG capsule  Commonly known as:  CYMBALTA  Take 1 capsule (60 mg total) by mouth daily.     gabapentin 100 MG capsule  Commonly known as:  NEURONTIN  Take 300 mg by mouth at bedtime.     glucose blood test strip  Commonly known as:  ONE TOUCH ULTRA TEST  1 each by Other route 2 (two) times daily. And lancets 2/day 250.01     HYDROcodone-acetaminophen 5-325 MG per tablet  Commonly known as:  NORCO/VICODIN  Take 1-2 tablets by mouth every 6 (six) hours as needed for moderate pain.     HYDROcodone-acetaminophen 5-325 MG per tablet  Commonly known as:  NORCO/VICODIN  Take 1-2 tablets by mouth every 4 (four) hours as needed (mild pain).     insulin NPH-regular Human (70-30) 100 UNIT/ML injection  Commonly known as:  NOVOLIN 70/30  Inject 40-90 Units into the skin 2 (two) times daily with a meal. 90 units with breakfast and 40 units with evening meal and syringes 2/day.     Insulin Syringe-Needle U-100 30G X 1/2" 0.5 ML Misc  Commonly known as:  B-D INS SYRINGE 0.5CC/30GX1/2"  Use 1 per day to inject insulin.     levothyroxine 75 MCG tablet  Commonly known as:  SYNTHROID, LEVOTHROID  Take 75 mcg by mouth daily before breakfast.     LORazepam 1 MG tablet  Commonly known as:  ATIVAN  Take 1 mg by mouth daily as needed for anxiety.     losartan 100 MG tablet  Commonly known as:  COZAAR  Take 1 tablet (100 mg total) by mouth daily.     metoprolol succinate 50 MG 24 hr tablet  Commonly known as:  TOPROL-XL  Take 1 tablet (50 mg total) by mouth 2 (two) times daily. Take with or immediately following a meal.     mometasone 50 MCG/ACT nasal spray  Commonly known as:  NASONEX  Place 2 sprays into the nose daily as needed (for allergies).     naproxen 500 MG tablet  Commonly known  as:  NAPROSYN  Take 500 mg by mouth 2 (two) times daily as needed (for pain).     primidone 50 MG tablet  Commonly known as:  MYSOLINE  Take 3 tablets (150 mg total) by mouth 2 (two) times daily.     ranitidine 150 MG tablet  Commonly known as:  ZANTAC  Take 150 mg by mouth daily.     sertraline 100 MG tablet  Commonly known as:  ZOLOFT  Take 1.5 tablets (150 mg total) by mouth daily.        Disposition: Home   Final Dx: Battery placement      Discharge Instructions   Call MD for:  difficulty breathing, headache or visual disturbances    Complete by:  As directed      Call MD for:  persistant nausea and vomiting    Complete by:  As directed      Call MD for:  redness, tenderness, or signs of infection (pain, swelling, redness, odor or green/yellow discharge around incision site)    Complete by:  As directed      Call MD for:  severe uncontrolled pain    Complete by:  As directed      Call MD for:  temperature >100.4    Complete by:  As directed      Diet - low sodium heart healthy    Complete by:  As directed      Discharge instructions    Complete by:  As directed   No strenuous activity, no driving, May bathe normally     Increase activity slowly    Complete by:  As directed      Remove dressing in 48 hours    Complete by:  As directed               Signed: Misao Fackrell S 12/16/2013, 8:56 AM

## 2013-12-16 NOTE — Progress Notes (Addendum)
Patient alert and oriented, mae's well, voiding adequate amount of urine, swallowing without difficulty, no c/o pain. Patient discharged home with family. Script and discharged instructions given to patient. Patient and family stated understanding of d/c instructions given and agree to call the office to schedule f/u appointment. RN informed patient and spouse of follow-up appointment with MD in 10 days for suture removal. Conley Rolls I RN

## 2013-12-16 NOTE — Evaluation (Addendum)
Occupational Therapy Evaluation Patient Details Name: [name] MRN: [id number] DOB: 16-Jan-1951 Today's Date: 12/16/2013    History of Present Illness [name] was admitted for bilateral deep brain stimulator lead placement for dx Essential Tremor, had previous deep brain stimulater placed on 12/05/13.    Clinical Impression   Pt s/p above. Education provided to pt and spouse. No further OT needs.     Follow Up Recommendations  No OT follow up;Supervision - Intermittent    Equipment Recommendations  3 in 1 bedside comode;Other (comment) (AE); RW   Recommendations for Other Services       Precautions / Restrictions Precautions Precautions: Fall Restrictions Weight Bearing Restrictions: No      Mobility Bed Mobility        General bed mobility comments: not assessed  Transfers Overall transfer level: Needs assistance   Transfers: Sit to/from Stand Sit to Stand: Min guard                 ADL Overall ADL's : Needs assistance/impaired                 Upper Body Dressing : Minimal assistance;Sitting;Standing   Lower Body Dressing: Minimal assistance;Sit to/from stand;With adaptive equipment   Toilet Transfer: Min guard;Ambulation;RW (chair/bed)       Tub/ Shower Transfer: Minimal assistance;Ambulation;Rolling walker   Functional mobility during ADLs: Min guard;Rolling walker (ambulated with and without walker) General ADL Comments: Educated on safety tips for home (use of bag on walker, sitting for LB ADLs, clutter/cords, safe shoewear). Recommended spouse be with her for shower transfer and educated on safe technique. Educated on use of 3 in 1 for shower chair.  Educated on AE for LB ADLs and pt practiced with sockaid.     Vision                     Perception     Praxis      Pertinent Vitals/Pain Pain Assessment: No/denies pain     Hand Dominance Right   Extremity/Trunk Assessment Upper Extremity Assessment Upper  Extremity Assessment: RUE deficits/detail;LUE deficits/detail RUE Deficits / Details: tremors RUE Coordination: decreased fine motor LUE Deficits / Details: tremors LUE Coordination: decreased fine motor   Lower Extremity Assessment Lower Extremity Assessment: Defer to PT evaluation   Cervical / Trunk Assessment Cervical / Trunk Assessment: Normal   Communication Communication Communication: No difficulties   Cognition Arousal/Alertness: Awake/alert Behavior During Therapy: WFL for tasks assessed/performed Overall Cognitive Status: Within Functional Limits for tasks assessed            General Comments       Exercises       Shoulder Instructions      Home Living Family/patient expects to be discharged to:: Private residence Living Arrangements: Spouse/significant other Available Help at Discharge: Family;Available 24 hours/day Type of Home: House Home Access: Level entry     Home Layout: One level     Bathroom Shower/Tub: Teacher, early years/pre: Standard (sink close) Bathroom Accessibility: Yes How Accessible: Accessible via walker Home Equipment:  (has weighted cup; AE utensils)          Prior Functioning/Environment Level of Independence: Independent        Comments: spouse does the cooking and cleaning    OT Diagnosis:     OT Problem List:     OT Treatment/Interventions:      OT Goals(Current goals can be found in the care plan section)  OT Frequency:     Barriers to D/C:            Co-evaluation              End of Session Equipment Utilized During Treatment: Gait belt;Rolling walker  Activity Tolerance: Patient tolerated treatment well Patient left:  In bed; with family/visitor present  Nurse communication: DME needs   Time: [phone] OT Time Calculation (min): 21 min Charges:  OT General Charges $OT Visit: 1 Procedure OT Evaluation $Initial OT Evaluation Tier I: 1 Procedure OT Treatments $Self Care/Home  Management : 8-22 mins G-Codes: OT G-codes **NOT FOR INPATIENT CLASS** Functional Assessment Tool Used: clinical judgment Functional Limitation: Self care Self Care Current Status (M7672): At least 20 percent but less than 40 percent impaired, limited or restricted Self Care Goal Status (C9470): At least 20 percent but less than 40 percent impaired, limited or restricted Self Care Discharge Status [phone]): At least 20 percent but less than 40 percent impaired, limited or restricted  Benito Mccreedy OTR/L [phone] 12/16/2013, 10:43 AM

## 2013-12-16 NOTE — Evaluation (Signed)
Physical Therapy Evaluation Patient Details Name: [name] MRN: [id number] DOB: [birth year] Today's Date: 12/16/2013   History of Present Illness  Nadira Single was admitted for bilateral deep brain stimulator lead placement for dx Essential Tremor, had previous deep brain stimulater placed on 12/05/13.   Clinical Impression  Patient demonstrates some deficits in mobility and balance secondary to UE tremors. Patient educated on safety with mobility. Patient will benefit from follow up outpatient PT services to address deficits indicated and improve overall independence.     Follow Up Recommendations Outpatient PT, neurorehabiliation    Equipment Recommendations       Recommendations for Other Services OT consult     Precautions / Restrictions Precautions Precautions: Fall Restrictions Weight Bearing Restrictions: No      Mobility  Bed Mobility Overal bed mobility: Modified Independent                Transfers Overall transfer level: Modified independent     Sit to Stand: Supervision         General transfer comment: supervision for safety with decreased balance  Ambulation/Gait Ambulation/Gait assistance: Supervision;Min guard Ambulation Distance (Feet): 620 Feet Assistive device: Rolling walker (2 wheeled) Gait Pattern/deviations: Step-through pattern;Decreased stride length;Shuffle;Drifts right/left   Gait velocity interpretation: Below normal speed for age/gender General Gait Details: instability noted with mabulation, reliance on RW to self correct balance checks. VCs for positioning and postural cues.  Stairs            Wheelchair Mobility    Modified Rankin (Stroke Patients Only)       Balance     Sitting balance-Leahy Scale: Good       Standing balance-Leahy Scale: Fair                               Pertinent Vitals/Pain Pain Assessment: No/denies pain    Home Living Family/patient expects to be discharged  to:: Private residence Living Arrangements: Spouse/significant other Available Help at Discharge: Family;Available 24 hours/day Type of Home: House Home Access: Level entry     Home Layout: One level Home Equipment: None      Prior Function Level of Independence: Independent         Comments: spouse does the cooking and cleaning     Hand Dominance   Dominant Hand: Right    Extremity/Trunk Assessment   Upper Extremity Assessment: Defer to OT evaluation           Lower Extremity Assessment: Overall WFL for tasks assessed      Cervical / Trunk Assessment: Normal  Communication   Communication: No difficulties  Cognition Arousal/Alertness: Awake/alert Behavior During Therapy: WFL for tasks assessed/performed Overall Cognitive Status: No family/caregiver present to determine baseline cognitive functioning (most likely at baseline) Area of Impairment: Safety/judgement         Safety/Judgement: Decreased awareness of deficits          General Comments      Exercises        Assessment/Plan    PT Assessment Patient needs continued PT services  PT Diagnosis Abnormality of gait   PT Problem List Decreased balance;Decreased mobility;Decreased safety awareness;Decreased knowledge of use of DME  PT Treatment Interventions Gait training;Functional mobility training;Therapeutic activities;Balance training;DME instruction;Patient/family education   PT Goals (Current goals can be found in the Care Plan section) Acute Rehab PT Goals Patient Stated Goal: return home  PT Goal Formulation: With patient Time For Goal Achievement:  12/12/13 Potential to Achieve Goals: Good    Frequency Min 5X/week   Barriers to discharge        Co-evaluation               End of Session Equipment Utilized During Treatment: Gait belt Activity Tolerance: Patient tolerated treatment well Patient left: in bed;with call bell/phone within reach Nurse Communication:  Mobility status;Precautions         Time: 0709-0730 PT Time Calculation (min): 21 min   Charges:   PT Evaluation $Initial PT Evaluation Tier I: 1 Procedure PT Treatments $Gait Training: 8-22 mins   PT G CodesDuncan Dull 12/16/2013, 7:36 AM Alben Deeds, PT DPT  [phone]

## 2013-12-18 NOTE — Anesthesia Postprocedure Evaluation (Signed)
  Anesthesia Post-op Note  Patient: [name]  Procedure(s) Performed: Procedure(s) with comments: BILATERAL PULSE GENERATOR IMPLANT (Bilateral) - BILATERAL  Patient Location: PACU  Anesthesia Type:General  Level of Consciousness: awake and alert   Airway and Oxygen Therapy: Patient Spontanous Breathing  Post-op Pain: mild  Post-op Assessment: Post-op Vital signs reviewed, Patient's Cardiovascular Status Stable and Respiratory Function Stable  Post-op Vital Signs: Reviewed  Filed Vitals:   12/16/13 0802  BP: 140/77  Pulse: 94  Temp: 37.1 C  Resp: 18    Complications: No apparent anesthesia complications

## 2013-12-20 NOTE — Addendum Note (Signed)
Addendum created 12/20/13 1306 by Alexis Frock, MD   Modules edited: Anesthesia Attestations

## 2013-12-21 ENCOUNTER — Encounter (HOSPITAL_COMMUNITY): Payer: Self-pay | Admitting: Neurosurgery

## 2013-12-28 ENCOUNTER — Other Ambulatory Visit: Payer: Self-pay | Admitting: Endocrinology

## 2014-01-01 ENCOUNTER — Encounter: Payer: Self-pay | Admitting: Neurology

## 2014-01-01 ENCOUNTER — Ambulatory Visit (INDEPENDENT_AMBULATORY_CARE_PROVIDER_SITE_OTHER): Payer: Medicare Other | Admitting: Neurology

## 2014-01-01 VITALS — BP 142/86 | HR 105 | Ht 64.5 in | Wt 192.8 lb

## 2014-01-01 DIAGNOSIS — R29898 Other symptoms and signs involving the musculoskeletal system: Secondary | ICD-10-CM

## 2014-01-01 DIAGNOSIS — R296 Repeated falls: Secondary | ICD-10-CM

## 2014-01-01 DIAGNOSIS — Z9181 History of falling: Secondary | ICD-10-CM

## 2014-01-01 DIAGNOSIS — G252 Other specified forms of tremor: Secondary | ICD-10-CM

## 2014-01-01 DIAGNOSIS — G250 Essential tremor: Secondary | ICD-10-CM

## 2014-01-01 NOTE — Progress Notes (Signed)
Subjective:   [name] was seen in f/u today.  DBS surgery to the bilateral VIM done 12/04/13 with bilateral generator placement on 12/15/13.  She has had several falls.  With several of these, she picked up something and fell when bending over.  The L leg is also giving way and seems a bit weak.  She states that there is some numbness in the top of the left leg as well.  There has been some back pain as well.  With each fall, the left leg ends up underneath her.  Tremor has been better.  Speech stable.   Admits that she has been scratching at the battery area and it has been red because of it.     Allergies  Allergen Reactions  . Metformin And Related Diarrhea  . Guaifenesin Other (See Comments)    MUCINEX REACTION: asthma attacks  . Morphine And Related Other (See Comments)    Pt. States med makes her crazy  . Oxycodone-Acetaminophen Itching    TYLOX. Caused internal itching per patient   . Guaifenesin Er Other (See Comments)    MUCINEX REACTION: asthma attacks    Outpatient Encounter Prescriptions as of 01/01/2014  Medication Sig  . albuterol (PROAIR HFA) 108 (90 BASE) MCG/ACT inhaler Inhale 2 puffs into the lungs every 6 (six) hours as needed for wheezing or shortness of breath.  Marland Kitchen albuterol (PROVENTIL) (2.5 MG/3ML) 0.083% nebulizer solution Take 2.5 mg by nebulization every 6 (six) hours as needed for wheezing or shortness of breath.  Marland Kitchen aspirin EC 81 MG tablet Take 81 mg by mouth every morning.   . DULoxetine (CYMBALTA) 60 MG capsule Take 1 capsule (60 mg total) by mouth daily.  Marland Kitchen gabapentin (NEURONTIN) 100 MG capsule Take 300 mg by mouth at bedtime.  Marland Kitchen glucose blood (ONE TOUCH ULTRA TEST) test strip 1 each by Other route 2 (two) times daily. And lancets 2/day 250.01  . HYDROcodone-acetaminophen (NORCO/VICODIN) 5-325 MG per tablet Take 1-2 tablets by mouth every 6 (six) hours as needed for moderate pain.  Marland Kitchen HYDROcodone-acetaminophen (NORCO/VICODIN) 5-325 MG per tablet Take 1-2  tablets by mouth every 4 (four) hours as needed (mild pain).  . insulin NPH-regular Human (NOVOLIN 70/30) (70-30) 100 UNIT/ML injection Inject 40-90 Units into the skin 2 (two) times daily with a meal. 90 units with breakfast and 40 units with evening meal and syringes 2/day.  . Insulin Syringe-Needle U-100 (B-D INS SYRINGE 0.5CC/30GX1/2") 30G X 1/2" 0.5 ML MISC Use 1 per day to inject insulin.  Marland Kitchen levothyroxine (SYNTHROID, LEVOTHROID) 75 MCG tablet Take 75 mcg by mouth daily before breakfast.  . levothyroxine (SYNTHROID, LEVOTHROID) 75 MCG tablet TAKE ONE TABLET BY MOUTH IN THE MORNING BEFORE BREAKFAST  . LORazepam (ATIVAN) 1 MG tablet Take 1 mg by mouth daily as needed for anxiety.  Marland Kitchen losartan (COZAAR) 100 MG tablet Take 1 tablet (100 mg total) by mouth daily.  . metoprolol succinate (TOPROL-XL) 50 MG 24 hr tablet Take 1 tablet (50 mg total) by mouth 2 (two) times daily. Take with or immediately following a meal.  . mometasone (NASONEX) 50 MCG/ACT nasal spray Place 2 sprays into the nose daily as needed (for allergies).   . naproxen (NAPROSYN) 500 MG tablet Take 500 mg by mouth 2 (two) times daily as needed (for pain).   . primidone (MYSOLINE) 50 MG tablet Take 3 tablets (150 mg total) by mouth 2 (two) times daily.  . ranitidine (ZANTAC) 150 MG tablet Take 150 mg by  mouth daily.  . sertraline (ZOLOFT) 100 MG tablet Take 1.5 tablets (150 mg total) by mouth daily.    Past Medical History  Diagnosis Date  . 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 [name], residual palpitations  . Vocal cord dysfunction   . Tremor, essential     takes Mysoline and Neurontin daily.  . Allergic rhinitis     uses Nasonex daily as needed  . Low sodium syndrome   . Chronic back pain     reason unknown  . Lumbar radiculopathy   . DDD (degenerative disc disease), lumbar   . Esophageal reflux     takes Zantac daily  . Hypertension     takes Losartan daily  . Anxiety      takes Ativan daily as needed  . Hypothyroidism     takes Synthroid daily  . Depression     takes Zoloft daily as well as Cymbalta  . Diabetes mellitus     takes NOvolin daily  . Asthma     Albuterol inhaler and Neb daily as needed  . History of bronchitis Dec 2014  . Seizures 04-05-13    one time ran out of Primidone and had stopped it cold [place]  . Weakness     in left arm  . Joint pain   . Joint swelling     left ankle  . Dysphagia   . Constipation     Past Surgical History  Procedure Laterality Date  . Tonsillectomy    . Right breast cyst      benign  . Left ankle ligament repair      x 2  . Left elbow repair    . Partial hysterectomy    . Cholecystectomy    . Cesarean section    . Ablation for avnrt    . Colonoscopy  01/16/2004    WEX:HBZJIR rectum/colon  . Esophagogastroduodenoscopy (egd) with esophageal dilation  04/03/2002    CVE:[id number] ring, otherwise normal esophagus, status post dilation with 54 F/Normal stomach  . Bartholin gland cyst excision      x 2  . Esophagogastroduodenoscopy (egd) with esophageal dilation N/A 11/08/2012    Procedure: ESOPHAGOGASTRODUODENOSCOPY (EGD) WITH ESOPHAGEAL DILATION;  Surgeon: Daneil Dolin, MD;  Location: [facility];  Service: Endoscopy;  Laterality: N/A;  11:30  . Subthalamic stimulator insertion Bilateral 12/04/2013    Procedure: Bilateral Deep brain stimulator placement;  Surgeon: [name], MD;  Location: [facility];  Service: Neurosurgery;  Laterality: Bilateral;  Bilateral Deep brain stimulator placement  . Pulse generator implant Bilateral 12/15/2013    Procedure: BILATERAL PULSE GENERATOR IMPLANT;  Surgeon: [name], MD;  Location: [facility];  Service: Neurosurgery;  Laterality: Bilateral;  BILATERAL    History   Social History  . Marital Status: Married    Spouse Name: N/A    Number of Children: N/A  . Years of Education: N/A   Occupational History  . retired    Social History Main Topics    . Smoking status: Former Smoker -- 0.30 packs/day for 10 years    Types: Cigarettes  . Smokeless tobacco: Never Used     Comment: quit smoking 25+yrs ago  . Alcohol Use: No     Comment: no alcohol in 44yrs  . Drug Use: No  . Sexual Activity: No   Other Topics Concern  . Not on file   Social History Narrative   Married, 1  daughte, living.  Lives with husband in [place], [place].   1 child died brain tumor at age 51.   Two grandchildren.   Works at [employer], patient currently lost her job.   Retired 2011.   No tobacco.   Alcohol: none in 30 yrs.  Distant history of heavy use.   No drug use.    Family Status  Relation Status Death Age  . Father Deceased     lung cancer, diabetes  . Mother Deceased     COPD, diabetes  . Son Deceased 7    brain tumor (neuroblastoma)  . Sister Deceased     blood clot  . Daughter Alive     healthy    Review of Systems A complete 10 system ROS was obtained and was negative apart from what is mentioned.   Objective:   VITALS:   Filed Vitals:   01/01/14 1344  BP: 142/86  Pulse: 105  Height: 5' 4.5" (1.638 m)  Weight: 192 lb 12.8 oz (87.454 kg)  SpO2: 96%   Gen: Appears stated age and in NAD.  HEENT: Normocephalic.  Scalp incisions are healing.  No drainage.  Mild erythema.  The mucous membranes are moist. The superficial temporal arteries are without ropiness or tenderness.  Cardiovascular: Regular rate and rhythm.  Lungs: Clear to auscultation bilaterally.  Neck: There are no carotid bruits noted bilaterally.  Skin:  R chest incision healing well.  L chest incision with significant erythema but no drainage or crepitance.    NEUROLOGICAL:  Orientation: The patient is alert and oriented x 3.  Cranial nerves: There is good facial symmetry.  Speech is fluent and clear. There is a vocal tremor and just slight pseudobulbar quality to the voice. Soft palate rises symmetrically and there is no tongue deviation. Hearing is intact to  conversational tone.  Tone: Tone is good throughout.  Sensation: Sensation is decreased over the L anterior medial and lateral thigh (no particular dermatomal level though) compared to the R Coordination: The patient has no dysdiadichokinesia or dysmetria.  Motor: Strength is 5/5 in the bilateral upper and R lower extremities.  Strength in the proximal LLE is 3/5 and is 5-/5 distally.  Shoulder shrug is equal bilaterally. There is no pronator drift. There are no fasciculations noted.  Gait and Station: The patient is not particularly unstable today (states that she isn't when on level surface) DTR's:  2-/4 at the bilateral biceps, triceps, brachioradialis, right patella, absent at the left patella and bilateral Achilles MOVEMENT EXAM:  Tremor: There is tremor in the UE, noted most significantly with action.There is head tremor at rest that is complex head titubation.   DBS programming was performed today which is described in more detail in a separate programming procedural notes.  In brief, there was marked improvement in tremor, especially on the right, after programming today.  She was able to pour water following activation of the device without spilling it.  She drew Archimedes spirals much better than in the past.     Assessment/Plan:   1.  Essential Tremor.  -This is evidenced by the symmetrical nature and longstanding hx of gradually getting worse.  -The patient is status post bilateral VIM DBS on 12/04/2013 with bilateral generator placement on 12/15/2013.  Postoperative imaging indicates that leads are perhaps just slightly too lateral and more inferior than anticipated.  -DBS device was activated today.  She had very good results with activation on the right hemibody (left brain), being able to  pour water from one glass to another with virtually no spilling of the water.  On the left hemibody, activation was complicated by dyskinesia, but reprogramming the device improved this and the  patient was significantly improved once she left.  -I did not change her tremor medications today, but I do plan to start weaning those next visit.  She was given a followup visit for one week.  -I. am worried about the left chest erythema.  She states that Dr. Vertell Limber saw this last week.  I told her if she should develop fever or any further spreading of erythema, she should call Dr. Melven Sartorius office immediately.  She was also told to stop itching, as she indicated that was what was causing the erythema.  Worried about this given her uncontrolled DM. 2.  multiple falls.  -I do not think that these are related to the DBS device.  This happened prior to activation of the DBS.  I think that this is either related to radiculopathy or diabetic amyotrophy.  She will have an EMG.  -refer to PT

## 2014-01-01 NOTE — Procedures (Signed)
DBS Programming was performed.    Total time spent programming was 120 minutes.  Device was initially off, as this was a new start.  The device was turned on.  Soft start was confirmed to be on.  Impedences were checked and were within normal limits.  Battery was checked and was determined to be functioning normally and not near the end of life.  Final settings were as follows:  Left brain electrode:     1-2+           ; Amplitude  1.7   V   ; Pulse width 90 microseconds;   Frequency   140   Hz.  Right brain electrode:     2-1+          ; Amplitude   1.5  V ;  Pulse width 60  microseconds;  Frequency   130    Hz. (Details of the right brain were electrode are recorded on a separate neurophysiologic worksheet.  Multiple settings were tried.  This included monopolar and bipolar settings.  The patient had dyskinesia on several settings and speech changes and facial pull on other settings.  Tremor was not fully controlled when she left today, but she was better than when she came in and was a side effect free so we left her at this setting and will reevaluate this next week)

## 2014-01-04 ENCOUNTER — Telehealth: Payer: Self-pay | Admitting: Neurology

## 2014-01-04 NOTE — Telephone Encounter (Signed)
Called patient and she states that she has not heard from [facility] about physical therapy. Referral faxed to [facility] at [phone] with confirmation received. They will contact patient to set this up.

## 2014-01-04 NOTE — Telephone Encounter (Signed)
Pt would like to talk to someone about physical therapy [phone]

## 2014-01-08 ENCOUNTER — Telehealth: Payer: Self-pay | Admitting: Neurology

## 2014-01-08 ENCOUNTER — Encounter (HOSPITAL_COMMUNITY): Payer: Self-pay | Admitting: Neurosurgery

## 2014-01-08 DIAGNOSIS — R296 Repeated falls: Secondary | ICD-10-CM

## 2014-01-08 NOTE — Telephone Encounter (Signed)
Prescription written for rolling walker with seat faxed to [facility] at [phone] with confirmation received per patient request.

## 2014-01-08 NOTE — Telephone Encounter (Signed)
Pt called requesting to speak to a nurse regarding getting a script for her To order a rolling chair. Please call pt # [phone]

## 2014-01-09 ENCOUNTER — Ambulatory Visit: Payer: Medicare Other | Admitting: Neurology

## 2014-01-10 ENCOUNTER — Encounter: Payer: Self-pay | Admitting: Neurology

## 2014-01-10 ENCOUNTER — Ambulatory Visit (INDEPENDENT_AMBULATORY_CARE_PROVIDER_SITE_OTHER): Payer: Medicare Other | Admitting: Neurology

## 2014-01-10 ENCOUNTER — Telehealth: Payer: Self-pay | Admitting: Neurology

## 2014-01-10 ENCOUNTER — Ambulatory Visit: Payer: Self-pay | Admitting: Internal Medicine

## 2014-01-10 VITALS — BP 128/72 | HR 100 | Ht 64.5 in | Wt 193.0 lb

## 2014-01-10 DIAGNOSIS — L03313 Cellulitis of chest wall: Secondary | ICD-10-CM

## 2014-01-10 DIAGNOSIS — Z9689 Presence of other specified functional implants: Secondary | ICD-10-CM

## 2014-01-10 DIAGNOSIS — G250 Essential tremor: Secondary | ICD-10-CM

## 2014-01-10 DIAGNOSIS — M5416 Radiculopathy, lumbar region: Secondary | ICD-10-CM | POA: Insufficient documentation

## 2014-01-10 DIAGNOSIS — Z9889 Other specified postprocedural states: Secondary | ICD-10-CM

## 2014-01-10 DIAGNOSIS — R29898 Other symptoms and signs involving the musculoskeletal system: Secondary | ICD-10-CM

## 2014-01-10 MED ORDER — CEPHALEXIN 500 MG PO CAPS: 500.0000 mg | ORAL_CAPSULE | Freq: Three times a day (TID) | ORAL | Status: DC

## 2014-01-10 NOTE — Procedures (Signed)
DBS Programming was performed.    Total time spent programming was 45 minutes.  Device was initially off, as this was a new start.  The device was turned on.  Soft start was confirmed to be on.  Impedences were checked and were within normal limits.  Battery was checked and was determined to be functioning normally and not near the end of life.  Final settings were as follows:  Left brain electrode:     1-2+           ; Amplitude  2.0   V   ; Pulse width 90 microseconds;   Frequency   140   Hz.  Right brain electrode:     2-1+          ; Amplitude   1.8  V ;  Pulse width 60  microseconds;  Frequency   130    Hz.

## 2014-01-10 NOTE — Procedures (Signed)
[facility] Neurology  [address]  [place], [place] [address] Tel: [phone] Fax:  [phone] Test Date:  01/10/2014  Patient: [name] DOB: 04-24-1950 Physician: Narda Amber, DO  Sex: Female Height: 5' 4.5" Ref Phys: Alonza Bogus  ID#: [id number] Temp: 33.0C Technician: Laureen Ochs R. NCS T.   Patient Complaints: This is a 63 year old female presenting for evaluation of left leg weakness and gait difficulty.  NCV & EMG Findings: Extensive electrodiagnostic testing of the left lower extremity and additional studies of the right reveals:  1. Bilateral sural sensory responses are within normal limits. The left superficial peroneal sensory response is mildly reduced in may be due to a proximally lying dorsal root ganglion, however in early large fiber neuropathy cannot be excluded.  The right superficial peroneal sensory response is borderline normal. 2. The left peroneal motor response recording at the extensor digitorum brevis is markedly reduced in may be due to known surgical procedures on this foot. The left peroneal motor response is within normal limits when recording at the tibialis anterior. Bilateral tibial and the right peroneal motor responses are within normal limits. 3. Active on chronic motor axon loss changes are seen affecting the L3-L4 myotomes on the left. Chronic motor axon loss changes are seen affecting the L5 myotomes bilaterally, but to a much less degree.  Impression: 1. Subacute radiculopathy affecting the left L4 > L3 nerve roots/segments, moderate in degree electrically. 2. Chronic L5 radiculopathy affecting bilateral lower extremities, very mild in degree electrically. 3. 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   ___________________________ Narda Amber, DO    Nerve Conduction Studies Anti Sensory Summary Table   Site NR Peak (ms) Norm Peak (ms) P-T  Amp (V) Norm P-T Amp  Left Sup Peroneal Anti Sensory (Ant Lat Mall)  12 cm    3.0 <4 5.1 >6  Right Sup Peroneal Anti Sensory (Ant Lat Mall)  12 cm    3.1 <4 6.7 >6  Left Sural Anti Sensory (Lat Mall)  Calf    4.0 <4.4 7.9 >6  Right Sural Anti Sensory (Lat Mall)  Calf    3.7 <4.4 7.7 >6   Motor Summary Table   Site NR Onset (ms) Norm Onset (ms) O-P Amp (mV) Norm O-P Amp Site1 Site2 Delta-0 (ms) Dist (cm) Vel (m/s) Norm Vel (m/s)  Left Peroneal Motor (Ext Dig Brev)  Ankle    5.7 <5.5 0.2 >2 B Fib Ankle 8.3 29.0 35 >41  B Fib    14.0  0.2  Poplt B Fib 2.3 9.0 39 >41  Poplt    16.3  0.2         Right Peroneal Motor (Ext Dig Brev)  Ankle    3.9 <5.5 3.5 >2 B Fib Ankle 7.7 32.0 42 >41  B Fib    11.6  3.2  Poplt B Fib 2.1 10.0 48 >41  Poplt    13.7  3.0         Left Peroneal TA Motor (Tib Ant)  Fib Head    3.4 <4.2 4.7  Poplit Fib Head 1.8 8.5 47 >40.5  Poplit    5.2 <5.7 4.4         Right Peroneal TA Motor (Tib Ant)  Fib Head    3.4 <4.2 6.1  Poplit Fib Head 1.8 10.0 56 >40.5  Poplit    5.2 <5.7 5.7         Left  Tibial Motor (Abd Hall Brev)  Ankle    4.5 <6.1 3.0 >3.0 Knee Ankle 9.6 36.5 38 >41  Knee    14.1  1.8         Right Tibial Motor (Abd Hall Brev)  Ankle    5.1 <6.1 3.4 >3.0 Knee Ankle 9.1 37.0 41 >41  Knee    14.2  2.7          EMG   Side Muscle Ins Act Fibs Psw Fasc Number Recrt Dur Dur. Amp Amp. Poly Poly. Comment  Left AntTibialis Nml Nml Nml Nml 1- Mod-V Few 1+ Nml Nml Nml Nml N/A  Left Gastroc Nml Nml Nml Nml Nml Nml Nml Nml Nml Nml Nml Nml N/A  Left Flex Dig Long Nml Nml Nml Nml 1- Mod-R Few 1+ Nml Nml Nml Nml N/A  Left RectFemoris Nml Nml 1+ Nml 1- Rapid Some 1+ Some 1+ Nml Nml N/A  Left VastusLat Nml Nml 1+ Nml 1- Rapid Some 1+ Some 1+ Nml Nml N/A  Left GluteusMed Nml Nml Nml Nml 1- Mod-R Few 1+ Nml Nml Nml Nml N/A  Left Iliacus Nml Nml Nml Nml Nml Nml Nml Nml Nml Nml Nml Nml N/A  Left AdductorLong Nml 1+ Nml Nml 2- Rapid Some 1+ Some 1+ Nml Nml N/A  Left  Lumbo Parasp Low Nml Nml Nml Nml Nml Nml Nml Nml Nml Nml Nml Nml N/A  Right AntTibialis Nml Nml Nml Nml 1- Mod Few 1+ Nml Nml Nml Nml N/A  Right Flex Dig Long Nml Nml Nml Nml 1- Mod Few 1+ Nml Nml Nml Nml N/A  Right RectFemoris Nml Nml Nml Nml Nml Nml Nml Nml Nml Nml Nml Nml N/A  Right Gastroc Nml Nml Nml Nml Nml Nml Nml Nml Nml Nml Nml Nml N/A  Right AdductorLong Nml Nml Nml Nml Nml Nml Nml Nml Nml Nml Nml Nml N/A      Waveforms:

## 2014-01-10 NOTE — Progress Notes (Signed)
Subjective:   [name] was seen in f/u today.  DBS surgery to the bilateral VIM done 12/04/13 with bilateral generator placement on 12/15/13.  She has had several falls.  With several of these, she picked up something and fell when bending over.  The L leg is also giving way and seems a bit weak.  She states that there is some numbness in the top of the left leg as well.  There has been some back pain as well.  With each fall, the left leg ends up underneath her.  Tremor has been better.  Speech stable.   Admits that she has been scratching at the battery area and it has been red because of it.    01/10/14 update:  Overall, pt has been doing well.  She had another fall since last visit.   After she got the walker, she hasn't fallen since.   Is very happy with the DBS surgery.   Pt states that she can carry a cup with open lids now.  Has back pain and L thigh pain.  Had EMG today.  D/W [name].  Acute radiculopathy, primarily of L4.   Allergies  Allergen Reactions  . Metformin And Related Diarrhea  . Guaifenesin Other (See Comments)    MUCINEX REACTION: asthma attacks  . Morphine And Related Other (See Comments)    Pt. States med makes her crazy  . Oxycodone-Acetaminophen Itching    TYLOX. Caused internal itching per patient   . Guaifenesin Er Other (See Comments)    MUCINEX REACTION: asthma attacks    Outpatient Encounter Prescriptions as of 01/10/2014  Medication Sig  . albuterol (PROAIR HFA) 108 (90 BASE) MCG/ACT inhaler Inhale 2 puffs into the lungs every 6 (six) hours as needed for wheezing or shortness of breath.  Marland Kitchen albuterol (PROVENTIL) (2.5 MG/3ML) 0.083% nebulizer solution Take 2.5 mg by nebulization every 6 (six) hours as needed for wheezing or shortness of breath.  Marland Kitchen aspirin EC 81 MG tablet Take 81 mg by mouth every morning.   . DULoxetine (CYMBALTA) 60 MG capsule Take 1 capsule (60 mg total) by mouth daily.  Marland Kitchen gabapentin (NEURONTIN) 100 MG capsule Take 300 mg by mouth at  bedtime.  Marland Kitchen glucose blood (ONE TOUCH ULTRA TEST) test strip 1 each by Other route 2 (two) times daily. And lancets 2/day 250.01  . HYDROcodone-acetaminophen (NORCO/VICODIN) 5-325 MG per tablet Take 1-2 tablets by mouth every 6 (six) hours as needed for moderate pain.  Marland Kitchen insulin NPH-regular Human (NOVOLIN 70/30) (70-30) 100 UNIT/ML injection Inject 40-90 Units into the skin 2 (two) times daily with a meal. 90 units with breakfast and 40 units with evening meal and syringes 2/day.  . Insulin Syringe-Needle U-100 (B-D INS SYRINGE 0.5CC/30GX1/2") 30G X 1/2" 0.5 ML MISC Use 1 per day to inject insulin.  Marland Kitchen levothyroxine (SYNTHROID, LEVOTHROID) 75 MCG tablet Take 75 mcg by mouth daily before breakfast.  . levothyroxine (SYNTHROID, LEVOTHROID) 75 MCG tablet TAKE ONE TABLET BY MOUTH IN THE MORNING BEFORE BREAKFAST  . LORazepam (ATIVAN) 1 MG tablet Take 1 mg by mouth daily as needed for anxiety.  Marland Kitchen losartan (COZAAR) 100 MG tablet Take 1 tablet (100 mg total) by mouth daily.  . metoprolol succinate (TOPROL-XL) 50 MG 24 hr tablet Take 1 tablet (50 mg total) by mouth 2 (two) times daily. Take with or immediately following a meal.  . mometasone (NASONEX) 50 MCG/ACT nasal spray Place 2 sprays into the nose daily as needed (for  allergies).   . naproxen (NAPROSYN) 500 MG tablet Take 500 mg by mouth 2 (two) times daily as needed (for pain).   . primidone (MYSOLINE) 50 MG tablet Take 3 tablets (150 mg total) by mouth 2 (two) times daily.  . ranitidine (ZANTAC) 150 MG tablet Take 150 mg by mouth daily.  . sertraline (ZOLOFT) 100 MG tablet Take 1.5 tablets (150 mg total) by mouth daily.  . [DISCONTINUED] HYDROcodone-acetaminophen (NORCO/VICODIN) 5-325 MG per tablet Take 1-2 tablets by mouth every 4 (four) hours as needed (mild pain).    Past Medical History  Diagnosis Date  . AV nodal re-entry tachycardia     s/p slow pathway ablation, 11/09, by [name], residual palpitations  . Vocal cord dysfunction   .  Tremor, essential     takes Mysoline and Neurontin daily.  . Allergic rhinitis     uses Nasonex daily as needed  . Low sodium syndrome   . Chronic back pain     reason unknown  . Lumbar radiculopathy   . DDD (degenerative disc disease), lumbar   . Esophageal reflux     takes Zantac daily  . Hypertension     takes Losartan daily  . Anxiety     takes Ativan daily as needed  . Hypothyroidism     takes Synthroid daily  . Depression     takes Zoloft daily as well as Cymbalta  . Diabetes mellitus     takes NOvolin daily  . Asthma     Albuterol inhaler and Neb daily as needed  . History of bronchitis Dec 2014  . Seizures 04-05-13    one time ran out of Primidone and had stopped it cold [place]  . Weakness     in left arm  . Joint pain   . Joint swelling     left ankle  . Dysphagia   . Constipation     Past Surgical History  Procedure Laterality Date  . Tonsillectomy    . Right breast cyst      benign  . Left ankle ligament repair      x 2  . Left elbow repair    . Partial hysterectomy    . Cholecystectomy    . Cesarean section    . Ablation for avnrt    . Colonoscopy  01/16/2004    PPI:RJJOAC rectum/colon  . Esophagogastroduodenoscopy (egd) with esophageal dilation  04/03/2002    ZYS:[id number]'U ring, otherwise normal esophagus, status post dilation with 54 F/Normal stomach  . Bartholin gland cyst excision      x 2  . Esophagogastroduodenoscopy (egd) with esophageal dilation N/A 11/08/2012    Procedure: ESOPHAGOGASTRODUODENOSCOPY (EGD) WITH ESOPHAGEAL DILATION;  Surgeon: Elizabeth Dolin, MD;  [facility];  Service: Endoscopy;  Laterality: N/A;  11:30  . Subthalamic stimulator insertion Bilateral 12/04/2013    Procedure: Bilateral Deep brain stimulator placement;  Surgeon: Elizabeth Levine, MD;  Location: [place] [facility];  Service: Neurosurgery;  Laterality: Bilateral;  Bilateral Deep brain stimulator placement  . Pulse generator implant Bilateral 12/15/2013     Procedure: BILATERAL PULSE GENERATOR IMPLANT;  Surgeon: Elizabeth Levine, MD;  Location: [place] [facility];  Service: Neurosurgery;  Laterality: Bilateral;  BILATERAL    History   Social History  . Marital Status: Married    Spouse Name: N/A    Number of Children: N/A  . Years of Education: N/A   Occupational History  . retired    Social History Main Topics  .  Smoking status: Former Smoker -- 0.30 packs/day for 10 years    Types: Cigarettes  . Smokeless tobacco: Never Used     Comment: quit smoking 25+yrs ago  . Alcohol Use: No     Comment: no alcohol in 91yrs  . Drug Use: No  . Sexual Activity: No   Other Topics Concern  . Not on file   Social History Narrative   Married, 1 daughte, living.  Lives with husband in [place], [place].   1 child died brain tumor at age 8.   Two grandchildren.   Works at [employer], patient currently lost her job.   Retired 2011.   No tobacco.   Alcohol: none in 30 yrs.  Distant history of heavy use.   No drug use.    Family Status  Relation Status Death Age  . Father Deceased     lung cancer, diabetes  . Mother Deceased     COPD, diabetes  . Son Deceased 7    brain tumor (neuroblastoma)  . Sister Deceased     blood clot  . Daughter Alive     healthy    Review of Systems A complete 10 system ROS was obtained and was negative apart from what is mentioned.   Objective:   VITALS:   Filed Vitals:   01/10/14 1156  BP: 128/72  Pulse: 100  Height: 5' 4.5" (1.638 m)  Weight: 193 lb (87.544 kg)   Gen: Appears stated age and in NAD.  HEENT: Normocephalic.  Scalp incisions are well healed.  The mucous membranes are moist. The superficial temporal arteries are without ropiness or tenderness.  Cardiovascular: Regular rate and rhythm.  Lungs: Clear to auscultation bilaterally.  Neck: There are no carotid bruits noted bilaterally.  Skin:  R chest incision healing well.  L chest incision with significant erythema but no  drainage.  NEUROLOGICAL:  Orientation: The patient is alert and oriented x 3.  Cranial nerves: There is good facial symmetry.  Speech is fluent and clear. There is a vocal tremor and just slight pseudobulbar quality to the voice. Soft palate rises symmetrically and there is no tongue deviation. Hearing is intact to conversational tone.  Tone: Tone is good throughout.  Sensation: Sensation is decreased over the L anterior medial and lateral thigh (no particular dermatomal level though) compared to the R Coordination: The patient has no dysdiadichokinesia or dysmetria.  Motor: Strength is 5/5 in the bilateral upper and R lower extremities.  Strength in the proximal LLE is 3/5 and is 5-/5 distally.  Shoulder shrug is equal bilaterally. There is no pronator drift. There are no fasciculations noted.  Gait and Station: The patient is not particularly unstable today (states that she isn't when on level surface) DTR's:  2-/4 at the bilateral biceps, triceps, brachioradialis, right patella, absent at the left patella and bilateral Achilles MOVEMENT EXAM:  Tremor: There is tremor in the UE, noted most significantly with action.There is head tremor at rest that is complex head titubation.   DBS programming was performed today which is described in more detail in a separate programming procedural notes.  In brief, there was marked improvement in tremor, especially on the right, after programming today.  She was able to pour water following activation of the device without spilling it.  She drew Archimedes spirals much better than in the past and wrote her name better that even last visit.      Assessment/Plan:   1.  Essential Tremor.  -This is  evidenced by the symmetrical nature and longstanding hx of gradually getting worse.  -The patient is status post bilateral VIM DBS on 12/04/2013 with bilateral generator placement on 12/15/2013.  Postoperative imaging indicates that leads are perhaps just slightly too  lateral and more inferior than anticipated.  -DBS device was programmed again today.  Neither side turned up high but R sided tremor well controlled.  Programming more challenging on the R brain (L body) due to lead position and SE with programming of dyskinesia and face pull.  Programming device slowly on that side and pt happy with results.  -I did not change her tremor medications today, and planned to start today but opted to hold that.   2.  Cellulitis, L battery site  -I. am worried about the left chest erythema.  Spoke with Dr. Vertell Limber.  Asked me to start pt on Keflex 500 tid and he will see her Friday.  Again stressed to pt importance of not touching or picking at battery site.  She understands that entire system will have to be removed if becomes infected. 3.  L4 radiculopathy  -EMG done today.  Radiculopathy is acute to subacute and active.  Talked with Dr. Vertell Limber about this and pt to see him on Friday. 4.  F/u with me in next 8 weeks.

## 2014-01-10 NOTE — Telephone Encounter (Addendum)
Left message on machine for patient to call back. To make her aware we have called in Keflex for her to start. She will have an appt with Dr Melven Sartorius office on Friday and Hildred Alamin will call her to set up a time. Awaiting call back to make her aware.  Also needs appt moved to last of the morning for follow up.

## 2014-01-11 NOTE — Telephone Encounter (Signed)
Left message on machine for patient to call back.

## 2014-01-11 NOTE — Telephone Encounter (Signed)
Patient made aware. Appt r/s to 11:15 am.

## 2014-01-11 NOTE — Telephone Encounter (Signed)
Pt is returning call to [facility]. CB# [phone] / Sherri S.

## 2014-01-21 ENCOUNTER — Emergency Department (HOSPITAL_COMMUNITY): Payer: Medicare Other

## 2014-01-21 ENCOUNTER — Encounter (HOSPITAL_COMMUNITY): Payer: Self-pay | Admitting: Emergency Medicine

## 2014-01-21 ENCOUNTER — Observation Stay (HOSPITAL_COMMUNITY)
Admission: EM | Admit: 2014-01-21 | Discharge: 2014-01-22 | Disposition: A | Discharge status: Home / Self Care | Payer: Medicare Other | Attending: Internal Medicine | Admitting: Internal Medicine

## 2014-01-21 DIAGNOSIS — T886XXA Anaphylactic reaction due to adverse effect of correct drug or medicament properly administered, initial encounter: Principal | ICD-10-CM | POA: Insufficient documentation

## 2014-01-21 DIAGNOSIS — E871 Hypo-osmolality and hyponatremia: Secondary | ICD-10-CM | POA: Insufficient documentation

## 2014-01-21 DIAGNOSIS — IMO0001 Reserved for inherently not codable concepts without codable children: Secondary | ICD-10-CM

## 2014-01-21 DIAGNOSIS — J3089 Other allergic rhinitis: Secondary | ICD-10-CM

## 2014-01-21 DIAGNOSIS — F419 Anxiety disorder, unspecified: Secondary | ICD-10-CM | POA: Diagnosis not present

## 2014-01-21 DIAGNOSIS — M654 Radial styloid tenosynovitis [de Quervain]: Secondary | ICD-10-CM

## 2014-01-21 DIAGNOSIS — Y9389 Activity, other specified: Secondary | ICD-10-CM | POA: Insufficient documentation

## 2014-01-21 DIAGNOSIS — G250 Essential tremor: Secondary | ICD-10-CM | POA: Diagnosis not present

## 2014-01-21 DIAGNOSIS — M5136 Other intervertebral disc degeneration, lumbar region: Secondary | ICD-10-CM | POA: Diagnosis not present

## 2014-01-21 DIAGNOSIS — M48061 Spinal stenosis, lumbar region without neurogenic claudication: Secondary | ICD-10-CM

## 2014-01-21 DIAGNOSIS — E119 Type 2 diabetes mellitus without complications: Secondary | ICD-10-CM

## 2014-01-21 DIAGNOSIS — K219 Gastro-esophageal reflux disease without esophagitis: Secondary | ICD-10-CM | POA: Insufficient documentation

## 2014-01-21 DIAGNOSIS — K222 Esophageal obstruction: Secondary | ICD-10-CM

## 2014-01-21 DIAGNOSIS — Z79899 Other long term (current) drug therapy: Secondary | ICD-10-CM | POA: Diagnosis not present

## 2014-01-21 DIAGNOSIS — E041 Nontoxic single thyroid nodule: Secondary | ICD-10-CM

## 2014-01-21 DIAGNOSIS — Z792 Long term (current) use of antibiotics: Secondary | ICD-10-CM | POA: Diagnosis not present

## 2014-01-21 DIAGNOSIS — K589 Irritable bowel syndrome without diarrhea: Secondary | ICD-10-CM

## 2014-01-21 DIAGNOSIS — I10 Essential (primary) hypertension: Secondary | ICD-10-CM

## 2014-01-21 DIAGNOSIS — Y9289 Other specified places as the place of occurrence of the external cause: Secondary | ICD-10-CM | POA: Diagnosis not present

## 2014-01-21 DIAGNOSIS — G8929 Other chronic pain: Secondary | ICD-10-CM | POA: Diagnosis not present

## 2014-01-21 DIAGNOSIS — R000 Tachycardia, unspecified: Secondary | ICD-10-CM

## 2014-01-21 DIAGNOSIS — Z888 Allergy status to other drugs, medicaments and biological substances status: Secondary | ICD-10-CM

## 2014-01-21 DIAGNOSIS — T783XXA Angioneurotic edema, initial encounter: Secondary | ICD-10-CM

## 2014-01-21 DIAGNOSIS — K5900 Constipation, unspecified: Secondary | ICD-10-CM | POA: Insufficient documentation

## 2014-01-21 DIAGNOSIS — F329 Major depressive disorder, single episode, unspecified: Secondary | ICD-10-CM | POA: Insufficient documentation

## 2014-01-21 DIAGNOSIS — Z7982 Long term (current) use of aspirin: Secondary | ICD-10-CM | POA: Insufficient documentation

## 2014-01-21 DIAGNOSIS — J4540 Moderate persistent asthma, uncomplicated: Secondary | ICD-10-CM | POA: Diagnosis present

## 2014-01-21 DIAGNOSIS — J45909 Unspecified asthma, uncomplicated: Secondary | ICD-10-CM | POA: Insufficient documentation

## 2014-01-21 DIAGNOSIS — J383 Other diseases of vocal cords: Secondary | ICD-10-CM | POA: Diagnosis not present

## 2014-01-21 DIAGNOSIS — Z87891 Personal history of nicotine dependence: Secondary | ICD-10-CM | POA: Diagnosis not present

## 2014-01-21 DIAGNOSIS — G252 Other specified forms of tremor: Secondary | ICD-10-CM

## 2014-01-21 DIAGNOSIS — Z794 Long term (current) use of insulin: Secondary | ICD-10-CM | POA: Diagnosis not present

## 2014-01-21 DIAGNOSIS — M7062 Trochanteric bursitis, left hip: Secondary | ICD-10-CM

## 2014-01-21 DIAGNOSIS — I471 Supraventricular tachycardia: Secondary | ICD-10-CM | POA: Diagnosis not present

## 2014-01-21 DIAGNOSIS — Z9689 Presence of other specified functional implants: Secondary | ICD-10-CM

## 2014-01-21 DIAGNOSIS — R7401 Elevation of levels of liver transaminase levels: Secondary | ICD-10-CM

## 2014-01-21 DIAGNOSIS — T464X1A Poisoning by angiotensin-converting-enzyme inhibitors, accidental (unintentional), initial encounter: Secondary | ICD-10-CM | POA: Diagnosis not present

## 2014-01-21 DIAGNOSIS — R740 Nonspecific elevation of levels of transaminase and lactic acid dehydrogenase [LDH]: Secondary | ICD-10-CM

## 2014-01-21 DIAGNOSIS — E039 Hypothyroidism, unspecified: Secondary | ICD-10-CM | POA: Diagnosis not present

## 2014-01-21 DIAGNOSIS — M5416 Radiculopathy, lumbar region: Secondary | ICD-10-CM

## 2014-01-21 DIAGNOSIS — J302 Other seasonal allergic rhinitis: Secondary | ICD-10-CM

## 2014-01-21 DIAGNOSIS — E785 Hyperlipidemia, unspecified: Secondary | ICD-10-CM

## 2014-01-21 DIAGNOSIS — T464X5A Adverse effect of angiotensin-converting-enzyme inhibitors, initial encounter: Secondary | ICD-10-CM

## 2014-01-21 DIAGNOSIS — R251 Tremor, unspecified: Secondary | ICD-10-CM

## 2014-01-21 DIAGNOSIS — J4520 Mild intermittent asthma, uncomplicated: Secondary | ICD-10-CM

## 2014-01-21 DIAGNOSIS — M722 Plantar fascial fibromatosis: Secondary | ICD-10-CM

## 2014-01-21 HISTORY — DX: Allergy status to other drugs, medicaments and biological substances status: Z88.8

## 2014-01-21 LAB — BASIC METABOLIC PANEL
Anion gap: 13 (ref 5–15)
BUN: 17 mg/dL (ref 6–23)
CO2: 25 mEq/L (ref 19–32)
Calcium: 9.1 mg/dL (ref 8.4–10.5)
Chloride: 96 mEq/L (ref 96–112)
Creatinine, Ser: 0.79 mg/dL (ref 0.50–1.10)
GFR calc Af Amer: >90 mL/min (ref >90)
GFR calc non Af Amer: 86 mL/min — ABNORMAL LOW (ref >90)
Glucose, Bld: 331 mg/dL — ABNORMAL HIGH (ref 70–99)
Potassium: 3.7 mEq/L (ref 3.7–5.3)
Sodium: 134 mEq/L — ABNORMAL LOW (ref 137–147)

## 2014-01-21 LAB — CBC WITH DIFFERENTIAL/PLATELET
Basophils Absolute: 0 10*3/uL (ref 0.0–0.1)
Basophils Relative: 0 % (ref 0–1)
Eosinophils Absolute: 0.3 10*3/uL (ref 0.0–0.7)
Eosinophils Relative: 4 % (ref 0–5)
HCT: 38.3 % (ref 36.0–46.0)
Hemoglobin: 12.9 g/dL (ref 12.0–15.0)
Lymphocytes Relative: 27 % (ref 12–46)
Lymphs Abs: 2.4 10*3/uL (ref 0.7–4.0)
MCH: 30.6 pg (ref 26.0–34.0)
MCHC: 33.7 g/dL (ref 30.0–36.0)
MCV: 91 fL (ref 78.0–100.0)
Monocytes Absolute: 0.5 10*3/uL (ref 0.1–1.0)
Monocytes Relative: 6 % (ref 3–12)
Neutro Abs: 5.6 10*3/uL (ref 1.7–7.7)
Neutrophils Relative %: 63 % (ref 43–77)
Platelets: 225 10*3/uL (ref 150–400)
RBC: 4.21 MIL/uL (ref 3.87–5.11)
RDW: 12.5 % (ref 11.5–15.5)
WBC: 8.9 10*3/uL (ref 4.0–10.5)

## 2014-01-21 LAB — GLUCOSE, CAPILLARY: Glucose-Capillary: 257 mg/dL — ABNORMAL HIGH (ref 70–99)

## 2014-01-21 MED ORDER — AMLODIPINE BESYLATE 5 MG PO TABS
10.0000 mg | ORAL_TABLET | Freq: Every day | ORAL | Status: DC
Start: 2014-01-21 — End: 2014-01-22
  Administered 2014-01-21 – 2014-01-22 (×2): 10 mg via ORAL
  Filled 2014-01-21 (×2): qty 2

## 2014-01-21 MED ORDER — METOPROLOL SUCCINATE ER 50 MG PO TB24
50.0000 mg | ORAL_TABLET | Freq: Two times a day (BID) | ORAL | Status: DC
  Administered 2014-01-21 – 2014-01-22 (×2): 50 mg via ORAL
  Filled 2014-01-21 (×2): qty 1

## 2014-01-21 MED ORDER — METHYLPREDNISOLONE SODIUM SUCC 125 MG IJ SOLR
125.0000 mg | Freq: Once | INTRAMUSCULAR | Status: AC
  Administered 2014-01-21: 125 mg via INTRAVENOUS
  Filled 2014-01-21: qty 2

## 2014-01-21 MED ORDER — ALBUTEROL SULFATE (2.5 MG/3ML) 0.083% IN NEBU: 3.0000 mL | INHALATION_SOLUTION | Freq: Four times a day (QID) | RESPIRATORY_TRACT | Status: DC | PRN

## 2014-01-21 MED ORDER — DOCUSATE SODIUM 100 MG PO CAPS: 100.0000 mg | ORAL_CAPSULE | Freq: Every day | ORAL | Status: DC | PRN

## 2014-01-21 MED ORDER — KETAMINE HCL 10 MG/ML IJ SOLN
1.5000 mg/kg | Freq: Once | INTRAMUSCULAR | Status: DC
  Filled 2014-01-21: qty 1

## 2014-01-21 MED ORDER — SODIUM CHLORIDE 0.9 % IJ SOLN
3.0000 mL | Freq: Two times a day (BID) | INTRAMUSCULAR | Status: DC
  Administered 2014-01-22: 3 mL via INTRAVENOUS

## 2014-01-21 MED ORDER — ASPIRIN EC 81 MG PO TBEC
81.0000 mg | DELAYED_RELEASE_TABLET | Freq: Every morning | ORAL | Status: DC
  Administered 2014-01-22: 81 mg via ORAL
  Filled 2014-01-21: qty 1

## 2014-01-21 MED ORDER — ALBUTEROL SULFATE (2.5 MG/3ML) 0.083% IN NEBU: 2.5000 mg | INHALATION_SOLUTION | Freq: Four times a day (QID) | RESPIRATORY_TRACT | Status: DC | PRN

## 2014-01-21 MED ORDER — FAMOTIDINE 20 MG PO TABS
20.0000 mg | ORAL_TABLET | Freq: Two times a day (BID) | ORAL | Status: DC
  Administered 2014-01-21 – 2014-01-22 (×2): 20 mg via ORAL
  Filled 2014-01-21 (×2): qty 1

## 2014-01-21 MED ORDER — SUCCINYLCHOLINE CHLORIDE 20 MG/ML IJ SOLN
1.5000 mg/kg | Freq: Once | INTRAMUSCULAR | Status: DC
  Filled 2014-01-21: qty 1

## 2014-01-21 MED ORDER — DIPHENHYDRAMINE HCL 50 MG/ML IJ SOLN
25.0000 mg | Freq: Once | INTRAMUSCULAR | Status: AC
  Administered 2014-01-21: 25 mg via INTRAVENOUS
  Filled 2014-01-21: qty 1

## 2014-01-21 MED ORDER — EPINEPHRINE 0.3 MG/0.3ML IJ SOAJ
0.3000 mg | Freq: Once | INTRAMUSCULAR | Status: DC
  Filled 2014-01-21: qty 0.3

## 2014-01-21 MED ORDER — INSULIN ASPART PROT & ASPART (70-30 MIX) 100 UNIT/ML ~~LOC~~ SUSP
40.0000 [IU] | Freq: Every day | SUBCUTANEOUS | Status: DC
  Filled 2014-01-21: qty 10

## 2014-01-21 MED ORDER — ACETAMINOPHEN 650 MG RE SUPP: 650.0000 mg | Freq: Four times a day (QID) | RECTAL | Status: DC | PRN

## 2014-01-21 MED ORDER — DIPHENHYDRAMINE HCL 50 MG/ML IJ SOLN: 25.0000 mg | Freq: Four times a day (QID) | INTRAMUSCULAR | Status: DC | PRN

## 2014-01-21 MED ORDER — INSULIN ASPART PROT & ASPART (70-30 MIX) 100 UNIT/ML ~~LOC~~ SUSP
90.0000 [IU] | Freq: Every day | SUBCUTANEOUS | Status: DC
  Administered 2014-01-22: 90 [IU] via SUBCUTANEOUS
  Filled 2014-01-21: qty 10

## 2014-01-21 MED ORDER — INSULIN ASPART 100 UNIT/ML ~~LOC~~ SOLN: 0.0000 [IU] | Freq: Three times a day (TID) | SUBCUTANEOUS | Status: DC

## 2014-01-21 MED ORDER — ACETAMINOPHEN 325 MG PO TABS: 650.0000 mg | ORAL_TABLET | Freq: Four times a day (QID) | ORAL | Status: DC | PRN

## 2014-01-21 MED ORDER — INSULIN ASPART 100 UNIT/ML ~~LOC~~ SOLN
0.0000 [IU] | Freq: Every day | SUBCUTANEOUS | Status: DC
  Administered 2014-01-21: 3 [IU] via SUBCUTANEOUS

## 2014-01-21 MED ORDER — SODIUM CHLORIDE 0.9 % IV SOLN
INTRAVENOUS | Status: AC
  Administered 2014-01-21: 21:00:00 via INTRAVENOUS

## 2014-01-21 MED ORDER — SODIUM CHLORIDE 0.9 % IV BOLUS (SEPSIS)
1000.0000 mL | Freq: Once | INTRAVENOUS | Status: AC
  Administered 2014-01-21: 1000 mL via INTRAVENOUS

## 2014-01-21 MED ORDER — EPINEPHRINE HCL 1 MG/ML IJ SOLN
INTRAMUSCULAR | Status: AC
  Filled 2014-01-21: qty 1

## 2014-01-21 MED ORDER — GABAPENTIN 100 MG PO CAPS
200.0000 mg | ORAL_CAPSULE | Freq: Every day | ORAL | Status: DC
  Administered 2014-01-21: 200 mg via ORAL
  Filled 2014-01-21: qty 2

## 2014-01-21 MED ORDER — ENOXAPARIN SODIUM 40 MG/0.4ML ~~LOC~~ SOLN
40.0000 mg | SUBCUTANEOUS | Status: DC
  Administered 2014-01-21: 40 mg via SUBCUTANEOUS
  Filled 2014-01-21: qty 0.4

## 2014-01-21 MED ORDER — ONDANSETRON HCL 4 MG/2ML IJ SOLN: 4.0000 mg | Freq: Four times a day (QID) | INTRAMUSCULAR | Status: DC | PRN

## 2014-01-21 MED ORDER — DULOXETINE HCL 60 MG PO CPEP
60.0000 mg | ORAL_CAPSULE | Freq: Every day | ORAL | Status: DC
  Administered 2014-01-22: 60 mg via ORAL
  Filled 2014-01-21: qty 1

## 2014-01-21 MED ORDER — METHYLPREDNISOLONE SODIUM SUCC 125 MG IJ SOLR
60.0000 mg | Freq: Two times a day (BID) | INTRAMUSCULAR | Status: DC
  Administered 2014-01-21: 60 mg via INTRAVENOUS
  Filled 2014-01-21: qty 2

## 2014-01-21 MED ORDER — LEVOTHYROXINE SODIUM 75 MCG PO TABS
75.0000 ug | ORAL_TABLET | Freq: Every day | ORAL | Status: DC
  Administered 2014-01-22: 75 ug via ORAL
  Filled 2014-01-21: qty 1

## 2014-01-21 MED ORDER — HYDROCODONE-ACETAMINOPHEN 5-325 MG PO TABS: 1.0000 | ORAL_TABLET | Freq: Four times a day (QID) | ORAL | Status: DC | PRN

## 2014-01-21 MED ORDER — ALUM & MAG HYDROXIDE-SIMETH 200-200-20 MG/5ML PO SUSP: 30.0000 mL | Freq: Four times a day (QID) | ORAL | Status: DC | PRN

## 2014-01-21 MED ORDER — EPINEPHRINE HCL 1 MG/ML IJ SOLN
0.3000 mg | Freq: Once | INTRAMUSCULAR | Status: AC
Start: 2014-01-21 — End: 2014-01-22
  Administered 2014-01-21: 0.3 mg via INTRAMUSCULAR

## 2014-01-21 MED ORDER — ONDANSETRON HCL 4 MG PO TABS: 4.0000 mg | ORAL_TABLET | Freq: Four times a day (QID) | ORAL | Status: DC | PRN

## 2014-01-21 MED ORDER — CEPHALEXIN 500 MG PO CAPS
500.0000 mg | ORAL_CAPSULE | Freq: Three times a day (TID) | ORAL | Status: DC
  Administered 2014-01-21 – 2014-01-22 (×2): 500 mg via ORAL
  Filled 2014-01-21 (×2): qty 1

## 2014-01-21 NOTE — ED Notes (Signed)
Patient reports tongue swelling that started at 1700 tonight. Also reports difficulty swallowing/painful swallowing earlier. Denies shortness of breath, trouble breathing, or trouble swallowing at this time.

## 2014-01-21 NOTE — ED Provider Notes (Signed)
CSN: [id number]     Arrival date & time 01/21/14  1850 History  This chart was scribed for Mariea Clonts, MD by Molli Posey, ED Scribe. This patient was seen in room APA04/APA04 and the patient's care was started 7:03 PM.    Chief Complaint  Patient presents with  . Angioedema     The history is provided by the patient. No language interpreter was used.   HPI Comments: Jolisa Intriago is a 63 y.o. female with a history of DM, HTN and asthma who presents to the Emergency Department complaining that her tongue is swollen with onset of 2.5 hours ago. She states that she is on lisinopril and took some this morning. She reports the swelling is gradually worse and has an associated voice change. She states she has left leg weakness but reports that is her baseline. She states that she had trouble swallowing earlier but not currently. She reports no CP, SOB, abomdinal pain or diarrhea as associated symptoms.   PCP Ronnald Ramp   Past Medical History  Diagnosis Date  . 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 [name], residual palpitations  . Vocal cord dysfunction   . Tremor, essential     takes Mysoline and Neurontin daily.  . Allergic rhinitis     uses Nasonex daily as needed  . Low sodium syndrome   . Chronic back pain     reason unknown  . Lumbar radiculopathy   . DDD (degenerative disc disease), lumbar   . Esophageal reflux     takes Zantac daily  . Hypertension     takes Losartan daily  . Anxiety     takes Ativan daily as needed  . Hypothyroidism     takes Synthroid daily  . Depression     takes Zoloft daily as well as Cymbalta  . Diabetes mellitus     takes NOvolin daily  . Asthma     Albuterol inhaler and Neb daily as needed  . History of bronchitis Dec 2014  . Seizures 04-05-13    one time ran out of Primidone and had stopped it cold [place]  . Weakness     in left arm  . Joint pain   . Joint swelling     left ankle  .  Dysphagia   . Constipation   . Allergy to angiotensin receptor blockers (ARB) 01/22/2014    Angioedema   Past Surgical History  Procedure Laterality Date  . Tonsillectomy    . Right breast cyst      benign  . Left ankle ligament repair      x 2  . Left elbow repair    . Partial hysterectomy    . Cholecystectomy    . Cesarean section    . Ablation for avnrt    . Colonoscopy  01/16/2004    HQI:ONGEXB rectum/colon  . Esophagogastroduodenoscopy (egd) with esophageal dilation  04/03/2002    MWU:[id number]'U ring, otherwise normal esophagus, status post dilation with 56 F/Normal stomach  . Bartholin gland cyst excision      x 2  . Esophagogastroduodenoscopy (egd) with esophageal dilation N/A 11/08/2012    Procedure: ESOPHAGOGASTRODUODENOSCOPY (EGD) WITH ESOPHAGEAL DILATION;  Surgeon: Daneil Dolin, MD;  [facility];  Service: Endoscopy;  Laterality: N/A;  11:30  . Subthalamic stimulator insertion Bilateral 12/04/2013    Procedure: Bilateral Deep brain stimulator placement;  Surgeon: [name], MD;  Location: [place] [facility];  Service: Neurosurgery;  Laterality: Bilateral;  Bilateral Deep brain stimulator placement  . Pulse generator implant Bilateral 12/15/2013    Procedure: BILATERAL PULSE GENERATOR IMPLANT;  Surgeon: [name], MD;  Location: [place] [facility];  Service: Neurosurgery;  Laterality: Bilateral;  BILATERAL   Family History  Problem Relation Age of Onset  . Lung cancer Father     DIED AGE 65 LUNG CA  . Alcohol abuse Father   . Anxiety disorder Father   . Depression Father   . COPD Mother     DIED AGE 51,MRSA,COPD,PNEUMONIA  . Pneumonia Mother     DIED AGE 51,MRSA,COPD,PNEUMONIA  . Supraventricular tachycardia Mother   . Anxiety disorder Mother   . Depression Mother   . Alcohol abuse Mother   . COPD Brother     AGE 18  . Heart disease Sister     DIED AGE 34, SMOKER,?HEART  . Colon cancer Neg Hx    History  Substance Use Topics  . Smoking status: Former  Smoker -- 0.30 packs/day for 10 years    Types: Cigarettes  . Smokeless tobacco: Never Used     Comment: quit smoking 25+yrs ago  . Alcohol Use: No     Comment: no alcohol in 49yrs   OB History   Grav Para Term Preterm Abortions TAB SAB Ect Mult Living                 Review of Systems  HENT: Positive for trouble swallowing and voice change.   Respiratory: Negative for shortness of breath and stridor.   Gastrointestinal: Negative for abdominal pain and diarrhea.  All other systems reviewed and are negative.     Allergies  Losartan; Metformin and related; Guaifenesin; Morphine and related; Oxycodone-acetaminophen; and Guaifenesin er  Home Medications   Prior to Admission medications   Medication Sig Start Date End Date Taking? Authorizing Provider  albuterol (PROAIR HFA) 108 (90 BASE) MCG/ACT inhaler Inhale 2 puffs into the lungs every 6 (six) hours as needed for wheezing or shortness of breath. 08/03/13  Yes Tammy S Parrett, NP  aspirin EC 81 MG tablet Take 81 mg by mouth every morning.    Yes Historical Provider, MD  cephALEXin (KEFLEX) 500 MG capsule Take 1 capsule (500 mg total) by mouth 3 (three) times daily. 01/10/14  Yes Rebecca S Tat, DO  DULoxetine (CYMBALTA) 60 MG capsule Take 1 capsule (60 mg total) by mouth daily. 11/07/13 11/07/14 Yes Levonne Spiller, MD  gabapentin (NEURONTIN) 100 MG capsule Take 200 mg by mouth at bedtime.    Yes Historical Provider, MD  HYDROcodone-acetaminophen (NORCO/VICODIN) 5-325 MG per tablet Take 1-2 tablets by mouth every 6 (six) hours as needed for moderate pain. 11/20/13  Yes Janith Lima, MD  insulin NPH-regular Human (NOVOLIN 70/30) (70-30) 100 UNIT/ML injection Inject 40-90 Units into the skin 2 (two) times daily with a meal. 90 units with breakfast and 40 units with evening meal and syringes 2/day. 10/13/13  Yes Renato Shin, MD  levothyroxine (SYNTHROID, LEVOTHROID) 75 MCG tablet Take 75 mcg by mouth daily before breakfast.   Yes Historical  Provider, MD  metoprolol succinate (TOPROL-XL) 50 MG 24 hr tablet Take 1 tablet (50 mg total) by mouth 2 (two) times daily. Take with or immediately following a meal. 05/04/13  Yes Janith Lima, MD  primidone (MYSOLINE) 50 MG tablet Take 3 tablets (150 mg total) by mouth 2 (two) times daily. 06/12/13  Yes Janith Lima, MD  ranitidine (ZANTAC) 150 MG  tablet Take 150 mg by mouth 2 (two) times daily.    Yes Historical Provider, MD  albuterol (PROVENTIL) (2.5 MG/3ML) 0.083% nebulizer solution Take 2.5 mg by nebulization every 6 (six) hours as needed for wheezing or shortness of breath.    Historical Provider, MD  amLODipine (NORVASC) 10 MG tablet Take 1 tablet (10 mg total) by mouth daily. 01/22/14   Radene Gunning, NP  predniSONE (DELTASONE) 50 MG tablet One tablet PO daily for 4 days 01/29/14   Sharyon Cable, MD   BP 153/89  Pulse 100  Temp(Src) 98.4 F (36.9 C) (Oral)  Resp 20  Ht 5' 4.5" (1.638 m)  Wt 196 lb 6.4 oz (89.086 kg)  BMI 33.20 kg/m2  SpO2 98% Physical Exam  Nursing note and vitals reviewed. Constitutional: She is oriented to person, place, and time. She appears well-developed and well-nourished.  HENT:  Head: Normocephalic and atraumatic.  Swelling in posterior pharynx Change in voice Swelling in tongue   Eyes: Pupils are equal, round, and reactive to light.  Cardiovascular: Normal rate, regular rhythm and normal heart sounds.   Pulmonary/Chest: Effort normal. No respiratory distress. She has no wheezes. She has no rales.  Abdominal: She exhibits no distension.  Musculoskeletal: Normal range of motion. She exhibits no edema.  Neurological: She is alert and oriented to person, place, and time.  Skin: Skin is warm and dry.  Psychiatric: She has a normal mood and affect.    ED Course  Procedures  CRITICAL CARE Performed by: Mariea Clonts   Total critical care time: 35 min  Critical care time was exclusive of separately billable procedures and treating other  patients.  Critical care was necessary to treat or prevent imminent or life-threatening deterioration.  Critical care was time spent personally by me on the following activities: development of treatment plan with patient and/or surrogate as well as nursing, discussions with consultants, evaluation of patient's response to treatment, examination of patient, obtaining history from patient or surrogate, ordering and performing treatments and interventions, ordering and review of laboratory studies, ordering and review of radiographic studies, pulse oximetry and re-evaluation of patient's condition.  DIAGNOSTIC STUDIES: Oxygen Saturation is 100% on RA, normal by my interpretation.    COORDINATION OF CARE: 7:11 PM Discussed treatment plan with pt at bedside and pt agreed to plan.  7:50 PM Patient reports she is feeling better. States she thinks her tongue swelling is improving.    Labs Review Labs Reviewed  BASIC METABOLIC PANEL - Abnormal; Notable for the following:    Sodium 134 (*)    Glucose, Bld 331 (*)    GFR calc non Af Amer 86 (*)    All other components within normal limits  BASIC METABOLIC PANEL - Abnormal; Notable for the following:    Glucose, Bld 324 (*)    All other components within normal limits  GLUCOSE, CAPILLARY - Abnormal; Notable for the following:    Glucose-Capillary 257 (*)    All other components within normal limits  GLUCOSE, CAPILLARY - Abnormal; Notable for the following:    Glucose-Capillary 306 (*)    All other components within normal limits  HEMOGLOBIN A1C - Abnormal; Notable for the following:    Hemoglobin A1C 7.0 (*)    Mean Plasma Glucose 154 (*)    All other components within normal limits  GLUCOSE, CAPILLARY - Abnormal; Notable for the following:    Glucose-Capillary 201 (*)    All other components within normal limits  CBC  WITH DIFFERENTIAL  CBC  TSH    Imaging Review Dg Chest 2 View (if Patient Has Fever And/or Copd)  01/29/2014    CLINICAL DATA:  Increased shortness of breath and wheezing. Initial encounter.  EXAM: CHEST  2 VIEW  COMPARISON:  01/21/2014  FINDINGS: No cardiomegaly.  Negative aortic contours.  There is worsening opacification the right base with bandlike opacity. Morphology suggests multi segment atelectasis. The right diaphragm remains elevated. No edema, consolidation, effusion, or pneumothorax.  Deep brain stimulator bilaterally with battery pack in the anterior chest wall.  IMPRESSION: Right basilar atelectasis, increased from 01/21/2014.   Electronically Signed   By: Jorje Guild M.D.   On: 01/29/2014 00:53     EKG Interpretation None      MDM   Final diagnoses:  Angioedema  ACE inhibitor-aggravated angioedema, initial encounter   I personally performed the services described in this documentation, which was scribed in my presence. The recorded information has been reviewed and is accurate.  Initially we planned for intubation however patient's symptoms continue to improve. Decision made to not intubate into monitor very closely, patient comfortable that plan. Discussed discontinuing her blood pressure medication. The patients results and plan were reviewed and discussed.   Any x-rays performed were personally reviewed by myself.   Differential diagnosis were considered with the presenting HPI.  Medications  sodium chloride 0.9 % bolus 1,000 mL (1,000 mLs Intravenous New Bag/Given 01/21/14 1920)  methylPREDNISolone sodium succinate (SOLU-MEDROL) 125 mg/2 mL injection 125 mg (125 mg Intravenous Given 01/21/14 1928)  diphenhydrAMINE (BENADRYL) injection 25 mg (25 mg Intravenous Given 01/21/14 1928)  EPINEPHrine (ADRENALIN) injection 0.3 mg ( Intramuscular Duplicate 60/73/71 0626)  0.9 %  sodium chloride infusion ( Intravenous New Bag/Given 01/21/14 2030)    Filed Vitals:   01/22/14 0756 01/22/14 1212 01/22/14 1213 01/22/14 1456  BP: 155/86 154/87  153/89  Pulse: 104 105 101 100  Temp:  98.9 F (37.2 C)   98.4 F (36.9 C)  TempSrc:      Resp: 20   20  Height:      Weight:      SpO2: 97%   98%    Final diagnoses:  Angioedema  ACE inhibitor-aggravated angioedema, initial encounter    Admission/ observation were discussed with the admitting physician, patient and/or family and they are comfortable with the plan.    Mariea Clonts, MD 01/29/14 [phone]

## 2014-01-21 NOTE — H&P (Signed)
History and Physical  [name] WFU:[id number] DOB: 07-07-1950 DOA: 01/21/2014  Referring physician: Dr Reather Converse, ED physician PCP: Scarlette Calico, MD   Chief Complaint: Tongue swelling  HPI: [name] is a 63 y.o. female  With a history of diabetes type 2, hyperlipidemia, hypertension, hypothyroidism, essential tremor who is status post deep brain stimulator surgery that took place on 12/04/2013. She underwent an additional surgery on   12/15/2013 for insertion of the pulse generator. Due to her hypertension, she has been on Cozaar for several years. Patient took her medications as she normally does. She awoke from a nap around 3:30 PM and noticed that her tongue was mildly swollen and that she was having slight difficulty speaking. She went to dinner around 5:30 and noticed that her symptoms continued to get worse. Her husband states that she was also speaking differently. There is no facial droop or slurred speech. There were no provoking or palliating factors. Due to the symptoms follow she presented to the hospital for evaluation. He received epinephrine, Benadryl, Solu-Medrol in the emergency department for angioedema and the patient noticed that her symptoms have greatly improved since receiving the medications.   Review of Systems:   Pt complains of weakness, numbness, pain in her left leg which started after her recent surgeries.  She is being evaluated by neurology who believes it to be from a pinched nerve root.  Pt denies any  fevers, chills, nausea, vomiting, shortness of breath, difficulty breathing, wheezing, palpitations, abdominal pain.  Review of systems are otherwise negative  Past Medical History  Diagnosis Date  . 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 [name], residual palpitations  . Vocal cord dysfunction   . Tremor, essential     takes Mysoline and Neurontin daily.  . Allergic rhinitis     uses Nasonex daily as needed    . Low sodium syndrome   . Chronic back pain     reason unknown  . Lumbar radiculopathy   . DDD (degenerative disc disease), lumbar   . Esophageal reflux     takes Zantac daily  . Hypertension     takes Losartan daily  . Anxiety     takes Ativan daily as needed  . Hypothyroidism     takes Synthroid daily  . Depression     takes Zoloft daily as well as Cymbalta  . Diabetes mellitus     takes NOvolin daily  . Asthma     Albuterol inhaler and Neb daily as needed  . History of bronchitis Dec 2014  . Seizures 04-05-13    one time ran out of Primidone and had stopped it cold [place]  . Weakness     in left arm  . Joint pain   . Joint swelling     left ankle  . Dysphagia   . Constipation    Past Surgical History  Procedure Laterality Date  . Tonsillectomy    . Right breast cyst      benign  . Left ankle ligament repair      x 2  . Left elbow repair    . Partial hysterectomy    . Cholecystectomy    . Cesarean section    . Ablation for avnrt    . Colonoscopy  01/16/2004    KGU:RKYHCW rectum/colon  . Esophagogastroduodenoscopy (egd) with esophageal dilation  04/03/2002    CBJ:SEGBTDVV'O ring, otherwise normal esophagus, status post dilation with 54 F/Normal stomach  .  Bartholin gland cyst excision      x 2  . Esophagogastroduodenoscopy (egd) with esophageal dilation N/A 11/08/2012    Procedure: ESOPHAGOGASTRODUODENOSCOPY (EGD) WITH ESOPHAGEAL DILATION;  Surgeon: Daneil Dolin, MD;  Location: [facility];  Service: Endoscopy;  Laterality: N/A;  11:30  . Subthalamic stimulator insertion Bilateral 12/04/2013    Procedure: Bilateral Deep brain stimulator placement;  Surgeon: [name], MD;  Location: [place] [facility];  Service: Neurosurgery;  Laterality: Bilateral;  Bilateral Deep brain stimulator placement  . Pulse generator implant Bilateral 12/15/2013    Procedure: BILATERAL PULSE GENERATOR IMPLANT;  Surgeon: [name], MD;  Location: [place] [facility];  Service: Neurosurgery;   Laterality: Bilateral;  BILATERAL   Social History:  reports that she has quit smoking. Her smoking use included Cigarettes. She has a 3 pack-year smoking history. She has never used smokeless tobacco. She reports that she does not drink alcohol or use illicit drugs.  Patient lives at at home & is able to participate in activities of daily living  Allergies  Allergen Reactions  . Metformin And Related Diarrhea  . Guaifenesin Other (See Comments)    MUCINEX REACTION: asthma attacks  . Morphine And Related Other (See Comments)    Pt. States med makes her crazy  . Oxycodone-Acetaminophen Itching    TYLOX. Caused internal itching per patient   . Guaifenesin Er Other (See Comments)    MUCINEX REACTION: asthma attacks    Family History  Problem Relation Age of Onset  . Lung cancer Father     DIED AGE 29 LUNG CA  . Alcohol abuse Father   . Anxiety disorder Father   . Depression Father   . COPD Mother     DIED AGE 25,MRSA,COPD,PNEUMONIA  . Pneumonia Mother     DIED AGE 25,MRSA,COPD,PNEUMONIA  . Supraventricular tachycardia Mother   . Anxiety disorder Mother   . Depression Mother   . Alcohol abuse Mother   . COPD Brother     AGE 48  . Heart disease Sister     DIED AGE 86, SMOKER,?HEART  . Colon cancer Neg Hx       Prior to Admission medications   Medication Sig Start Date End Date Taking? Authorizing Provider  albuterol (PROAIR HFA) 108 (90 BASE) MCG/ACT inhaler Inhale 2 puffs into the lungs every 6 (six) hours as needed for wheezing or shortness of breath. 08/03/13  Yes Tammy S Parrett, NP  aspirin EC 81 MG tablet Take 81 mg by mouth every morning.    Yes Historical Provider, MD  cephALEXin (KEFLEX) 500 MG capsule Take 1 capsule (500 mg total) by mouth 3 (three) times daily. 01/10/14  Yes Rebecca S Tat, DO  DULoxetine (CYMBALTA) 60 MG capsule Take 1 capsule (60 mg total) by mouth daily. 11/07/13 11/07/14 Yes Levonne Spiller, MD  gabapentin (NEURONTIN) 100 MG capsule Take 200 mg by  mouth at bedtime.    Yes Historical Provider, MD  HYDROcodone-acetaminophen (NORCO/VICODIN) 5-325 MG per tablet Take 1-2 tablets by mouth every 6 (six) hours as needed for moderate pain. 11/20/13  Yes Janith Lima, MD  insulin NPH-regular Human (NOVOLIN 70/30) (70-30) 100 UNIT/ML injection Inject 40-90 Units into the skin 2 (two) times daily with a meal. 90 units with breakfast and 40 units with evening meal and syringes 2/day. 10/13/13  Yes Renato Shin, MD  levothyroxine (SYNTHROID, LEVOTHROID) 75 MCG tablet Take 75 mcg by mouth daily before breakfast.   Yes Historical Provider, MD  losartan (COZAAR) 100 MG  tablet Take 1 tablet (100 mg total) by mouth daily. 10/30/13  Yes Janith Lima, MD  metoprolol succinate (TOPROL-XL) 50 MG 24 hr tablet Take 1 tablet (50 mg total) by mouth 2 (two) times daily. Take with or immediately following a meal. 05/04/13  Yes Janith Lima, MD  primidone (MYSOLINE) 50 MG tablet Take 3 tablets (150 mg total) by mouth 2 (two) times daily. 06/12/13  Yes Janith Lima, MD  ranitidine (ZANTAC) 150 MG tablet Take 150 mg by mouth 2 (two) times daily.    Yes Historical Provider, MD  albuterol (PROVENTIL) (2.5 MG/3ML) 0.083% nebulizer solution Take 2.5 mg by nebulization every 6 (six) hours as needed for wheezing or shortness of breath.    Historical Provider, MD    Physical Exam: BP 137/102  Pulse 121  Temp(Src) 98.6 F (37 C) (Oral)  Resp 21  Ht 5' 4.5" (1.638 m)  Wt 87.544 kg (193 lb)  BMI 32.63 kg/m2  SpO2 100%  General:  elderly Caucasian female. Awake and alert and oriented x3. No acute cardiopulmonary distress.  Eyes: Pupils equal, round, reactive to light. Extraocular muscles are intact. Sclerae anicteric and noninjected.  ENT: External auditory canals are patent and tympanic membranes reflect a good cone of light. Moist mucosal membranes. No mucosal lesions. Neck: Neck supple without lymphadenopathy. No carotid bruits. No masses palpated.  Cardiovascular:  Regular rate with normal S1-S2 sounds. No murmurs, rubs, gallops auscultated. No JVD.  Respiratory: Good respiratory effort with no wheezes, rales, rhonchi. Lungs clear to auscultation bilaterally.  Abdomen: Soft, nontender, nondistended.  active bowel sounds. No masses or hepatosplenomegaly  Skin: Dry, warm to touch. 2+ dorsalis pedis and radial pulses. Musculoskeletal: No calf or leg pain. All major joints not erythematous nontender.  Psychiatric: Intact judgment and insight.  Neurologic: No focal neurological deficits. Cranial nerves II through XII are grossly intact. a resting tremor is visibly noticeable. Strength is intact            Labs on Admission:  Basic Metabolic Panel:  Recent Labs Lab 01/21/14 1912  NA 134*  K 3.7  CL 96  CO2 25  GLUCOSE 331*  BUN 17  CREATININE 0.79  CALCIUM 9.1   Liver Function Tests: No results found for this basename: AST, ALT, ALKPHOS, BILITOT, PROT, ALBUMIN,  in the last 168 hours No results found for this basename: LIPASE, AMYLASE,  in the last 168 hours No results found for this basename: AMMONIA,  in the last 168 hours CBC:  Recent Labs Lab 01/21/14 1912  WBC 8.9  NEUTROABS 5.6  HGB 12.9  HCT 38.3  MCV 91.0  PLT 225   Cardiac Enzymes: No results found for this basename: CKTOTAL, CKMB, CKMBINDEX, TROPONINI,  in the last 168 hours  BNP (last 3 results) No results found for this basename: PROBNP,  in the last 8760 hours CBG: No results found for this basename: GLUCAP,  in the last 168 hours  Radiological Exams on Admission: Dg Chest Port 1 View  01/21/2014   CLINICAL DATA:  Swelling of the tongue beginning tonight.  EXAM: PORTABLE CHEST - 1 VIEW  COMPARISON:  11/20/2013  FINDINGS: There is a neurostimulator pack projecting over the upper aspect of both sides of the chest. Heart size is normal. The left lung is clear. There is chronic elevation of the right hemidiaphragm with chronic volume loss at the right base. No active  process evident.  IMPRESSION: Chronic elevation of the right hemidiaphragm with chronic volume loss  of the right lung base.  Newly seen bilateral neurostimulator devices projecting over the upper aspect of both sides of the chest.   Electronically Signed   By: Nelson Chimes M.D.   On: 01/21/2014 19:26    Assessment/Plan Present on Admission:  . ACE inhibitor-aggravated angioedema   #1 angioedema - ARB related   admit for observation on telemetry. Will hold her Cozaar and replace with amlodipine. Additionally, we'll continue the Solu-Medrol and Benadryl to help with any residual symptoms. She is currently tolerating liquids without difficulty, will start diet.   #2 diabetes mellitus Continue home insulin regimen with sliding scale and CBGs prior to meals.  #3 hypertension Start amlodipine and hold Cozaar. Continue metoprolol.  #4 hypothyroidism Continue levothyroxin  DVT prophylaxis:  Lovenox  Consultants:  none  Code Status:  full code  Family Communication:  husband in room   Disposition Plan:  home following resolution of angioedema  Time spent:  50 minutes  [place], [place] [facility] Pager [phone]  **Disclaimer: This note may have been dictated with voice recognition software. Similar sounding words can inadvertently be transcribed and this note may contain transcription errors which may not have been corrected upon publication of note.**

## 2014-01-21 NOTE — Progress Notes (Signed)
Intubation tray and crash cart opened, everything is ready waiting for intubation if necessary. Patient talking states she feels better on 4lpm/[place] Sats 100%

## 2014-01-22 ENCOUNTER — Encounter (HOSPITAL_COMMUNITY): Payer: Self-pay | Admitting: Internal Medicine

## 2014-01-22 DIAGNOSIS — R251 Tremor, unspecified: Secondary | ICD-10-CM

## 2014-01-22 DIAGNOSIS — M5416 Radiculopathy, lumbar region: Secondary | ICD-10-CM

## 2014-01-22 DIAGNOSIS — T783XXA Angioneurotic edema, initial encounter: Secondary | ICD-10-CM

## 2014-01-22 DIAGNOSIS — Z888 Allergy status to other drugs, medicaments and biological substances status: Secondary | ICD-10-CM

## 2014-01-22 DIAGNOSIS — T886XXA Anaphylactic reaction due to adverse effect of correct drug or medicament properly administered, initial encounter: Secondary | ICD-10-CM | POA: Diagnosis not present

## 2014-01-22 DIAGNOSIS — R000 Tachycardia, unspecified: Secondary | ICD-10-CM | POA: Diagnosis present

## 2014-01-22 DIAGNOSIS — E119 Type 2 diabetes mellitus without complications: Secondary | ICD-10-CM

## 2014-01-22 HISTORY — DX: Allergy status to other drugs, medicaments and biological substances: Z88.8

## 2014-01-22 LAB — BASIC METABOLIC PANEL
Anion gap: 14 (ref 5–15)
BUN: 12 mg/dL (ref 6–23)
CO2: 24 mEq/L (ref 19–32)
Calcium: 9.5 mg/dL (ref 8.4–10.5)
Chloride: 101 mEq/L (ref 96–112)
Creatinine, Ser: 0.55 mg/dL (ref 0.50–1.10)
GFR calc Af Amer: >90 mL/min (ref >90)
GFR calc non Af Amer: >90 mL/min (ref >90)
Glucose, Bld: 324 mg/dL — ABNORMAL HIGH (ref 70–99)
Potassium: 4.2 mEq/L (ref 3.7–5.3)
Sodium: 139 mEq/L (ref 137–147)

## 2014-01-22 LAB — GLUCOSE, CAPILLARY
Glucose-Capillary: 306 mg/dL — ABNORMAL HIGH (ref 70–99)
Glucose-Capillary: 201 mg/dL — ABNORMAL HIGH (ref 70–99)

## 2014-01-22 LAB — CBC
HCT: 39.9 % (ref 36.0–46.0)
Hemoglobin: 13.7 g/dL (ref 12.0–15.0)
MCH: 30.7 pg (ref 26.0–34.0)
MCHC: 34.3 g/dL (ref 30.0–36.0)
MCV: 89.5 fL (ref 78.0–100.0)
Platelets: 265 10*3/uL (ref 150–400)
RBC: 4.46 MIL/uL (ref 3.87–5.11)
RDW: 12.3 % (ref 11.5–15.5)
WBC: 6.9 10*3/uL (ref 4.0–10.5)

## 2014-01-22 LAB — HEMOGLOBIN A1C
Hgb A1c MFr Bld: 7 % — ABNORMAL HIGH (ref <5.7)
Mean Plasma Glucose: 154 mg/dL — ABNORMAL HIGH (ref <117)

## 2014-01-22 LAB — TSH: TSH: 1.69 u[IU]/mL (ref 0.350–4.500)

## 2014-01-22 MED ORDER — METHYLPREDNISOLONE SODIUM SUCC 40 MG IJ SOLR
40.0000 mg | Freq: Two times a day (BID) | INTRAMUSCULAR | Status: DC
  Administered 2014-01-22: 40 mg via INTRAVENOUS
  Filled 2014-01-22: qty 1

## 2014-01-22 MED ORDER — AMLODIPINE BESYLATE 10 MG PO TABS: 10.0000 mg | ORAL_TABLET | Freq: Every day | ORAL | Status: DC

## 2014-01-22 MED ORDER — INSULIN ASPART 100 UNIT/ML ~~LOC~~ SOLN: 0.0000 [IU] | Freq: Every day | SUBCUTANEOUS | Status: DC

## 2014-01-22 MED ORDER — BISACODYL 5 MG PO TBEC: 5.0000 mg | DELAYED_RELEASE_TABLET | Freq: Every day | ORAL | Status: DC | PRN

## 2014-01-22 MED ORDER — PREDNISONE 10 MG PO TABS: ORAL_TABLET | ORAL | Status: DC

## 2014-01-22 MED ORDER — INSULIN ASPART 100 UNIT/ML ~~LOC~~ SOLN
0.0000 [IU] | Freq: Three times a day (TID) | SUBCUTANEOUS | Status: DC
  Administered 2014-01-22: 15 [IU] via SUBCUTANEOUS
  Administered 2014-01-22: 7 [IU] via SUBCUTANEOUS

## 2014-01-22 MED ORDER — PRIMIDONE 50 MG PO TABS
150.0000 mg | ORAL_TABLET | Freq: Two times a day (BID) | ORAL | Status: DC
Start: 2014-01-22 — End: 2014-01-22
  Administered 2014-01-22: 150 mg via ORAL
  Filled 2014-01-22 (×5): qty 3

## 2014-01-22 MED ORDER — DIPHENHYDRAMINE HCL 12.5 MG/5ML PO ELIX: 12.5000 mg | ORAL_SOLUTION | Freq: Two times a day (BID) | ORAL | Status: DC

## 2014-01-22 MED ORDER — DIPHENHYDRAMINE HCL 12.5 MG/5ML PO ELIX
12.5000 mg | ORAL_SOLUTION | Freq: Three times a day (TID) | ORAL | Status: DC
Start: 2014-01-22 — End: 2014-01-22
  Administered 2014-01-22 (×2): 12.5 mg via ORAL
  Filled 2014-01-22 (×2): qty 5

## 2014-01-22 MED FILL — Medication: Qty: 1 | Status: AC

## 2014-01-22 NOTE — Discharge Summary (Signed)
Physician Discharge Summary  [name] WVP:[id number] DOB: 04-30-1950 DOA: 01/21/2014  PCP: Elizabeth Calico, MD  Admit date: 01/21/2014 Discharge date: 01/22/2014  Time spent: 40 minutes  Recommendations for Outpatient Follow-up:  1. Follow up with PCP in 1-2 weeks for evaluation of symptoms. Recommend tracking BP as anti-hypertensive medications changed.  2. Of note, patient instructed to not take ARB or ACE inhibitor medication  Discharge Diagnoses:  Principal Problem:   Angioedema Active Problems:   Type 2 diabetes mellitus without complications   TREMOR, ESSENTIAL   Essential hypertension   Tachyarrhythmia   Hypothyroidism   Moderate intermittent asthma without complication   Allergy to angiotensin receptor blockers (ARB)   Sinus tachycardia   Discharge Condition: stable  Diet recommendation: carb modified  Filed Weights   01/21/14 1857 01/21/14 2100  Weight: 87.544 kg (193 lb) 89.086 kg (196 lb 6.4 oz)    History of present illness:   [name] patient with a history of diabetes type 2, hyperlipidemia, hypertension, hypothyroidism, essential tremor who is status post deep brain stimulator surgery that took place on 12/04/2013. She underwent an additional surgery on 12/15/2013 for insertion of the pulse generator. Due to her hypertension, she had been on Cozaar for several years. Patient took her medications as she normally does. She awoke from a nap around 3:30 PM on 01/21/14 and noticed that her tongue was mildly swollen and that she was having slight difficulty speaking. She went to dinner around 5:30 and noticed that her symptoms continued to get worse. Her husband stated that she was also speaking differently. There was no facial droop or slurred speech. There were no provoking or palliating factors. She presented to the hospital for evaluation. SHe received epinephrine, Benadryl, Solu-Medrol in the emergency department for angioedema and the patient noticed that her  symptoms greatly improved since receiving the medications   Hospital Course:  #1 angioedema -  Most likely ARB related. She never experienced airway compromise.  She was provided with solumedrol and benadryl and quickly improved. Her Cozaar was discontinued and replaced with amlodipine. At time of discharge angioedema resolved and she was tolerating liquids without difficulty. Will continue prednisone for 2 more days at discharge. Recommend follow up with PCP 1-2 weeks.   #2 diabetes mellitus: CBG range 201-306. Likely related to steroids. Continue home insulin regimen   #3 hypertension: fair control. Amlodipine started and Cozaar discontinued due to #1. Continue metoprolol. Recommend close OP follow up with PCP for optimal control  #4 hypothyroidism: Continue levothyroxin  #5. Sinus tachycardia: mild at discharge. Likely related to missed metoprolol. Recommend OP follow up.    Procedures:  none  Consultations:  none  Discharge Exam: Filed Vitals:   01/22/14 1213  BP:   Pulse: 101  Temp:   Resp:     General: well nourished appears comfortable  Cardiovascular: RRR No MGR No LE edema Respiratory: normal effort BS clear bilaterally no wheeze Head: mucus membranes mouth moist/pink. No swelling of tongue.   Discharge Instructions You were cared for by a hospitalist during your hospital stay. If you have any questions about your discharge medications or the care you received while you were in the hospital after you are discharged, you can call the unit and asked to speak with the hospitalist on call if the hospitalist that took care of you is not available. Once you are discharged, your primary care physician will handle any further medical issues. Please note that NO REFILLS for any discharge medications will be authorized  once you are discharged, as it is imperative that you return to your primary care physician (or establish a relationship with a primary care physician if you do  not have one) for your aftercare needs so that they can reassess your need for medications and monitor your lab values.   Current Discharge Medication List    START taking these medications   Details  amLODipine (NORVASC) 10 MG tablet Take 1 tablet (10 mg total) by mouth daily. Qty: 30 tablet, Refills: 0    diphenhydrAMINE (BENADRYL) 12.5 MG/5ML elixir Take 5 mLs (12.5 mg total) by mouth every 12 (twelve) hours. For 2 days starting 01/23/14. Qty: 120 mL, Refills: 0    predniSONE (DELTASONE) 10 MG tablet Take 1 tab every 12 hours for 2 days starting 01/23/14. Qty: 4 tablet, Refills: 0      CONTINUE these medications which have NOT CHANGED   Details  albuterol (PROAIR HFA) 108 (90 BASE) MCG/ACT inhaler Inhale 2 puffs into the lungs every 6 (six) hours as needed for wheezing or shortness of breath. Qty: 8.5 g, Refills: 5    aspirin EC 81 MG tablet Take 81 mg by mouth every morning.     cephALEXin (KEFLEX) 500 MG capsule Take 1 capsule (500 mg total) by mouth 3 (three) times daily. Qty: 30 capsule, Refills: 0    DULoxetine (CYMBALTA) 60 MG capsule Take 1 capsule (60 mg total) by mouth daily. Qty: 30 capsule, Refills: 2    gabapentin (NEURONTIN) 100 MG capsule Take 200 mg by mouth at bedtime.     HYDROcodone-acetaminophen (NORCO/VICODIN) 5-325 MG per tablet Take 1-2 tablets by mouth every 6 (six) hours as needed for moderate pain. Qty: 100 tablet, Refills: 0   Associated Diagnoses: Spinal stenosis of lumbar region    insulin NPH-regular Human (NOVOLIN 70/30) (70-30) 100 UNIT/ML injection Inject 40-90 Units into the skin 2 (two) times daily with a meal. 90 units with breakfast and 40 units with evening meal and syringes 2/day.    levothyroxine (SYNTHROID, LEVOTHROID) 75 MCG tablet Take 75 mcg by mouth daily before breakfast.    metoprolol succinate (TOPROL-XL) 50 MG 24 hr tablet Take 1 tablet (50 mg total) by mouth 2 (two) times daily. Take with or immediately following a  meal. Qty: 60 tablet, Refills: 11    primidone (MYSOLINE) 50 MG tablet Take 3 tablets (150 mg total) by mouth 2 (two) times daily. Qty: 180 tablet, Refills: 5    ranitidine (ZANTAC) 150 MG tablet Take 150 mg by mouth 2 (two) times daily.     albuterol (PROVENTIL) (2.5 MG/3ML) 0.083% nebulizer solution Take 2.5 mg by nebulization every 6 (six) hours as needed for wheezing or shortness of breath.      STOP taking these medications     losartan (COZAAR) 100 MG tablet        Allergies  Allergen Reactions  . Metformin And Related Diarrhea  . Guaifenesin Other (See Comments)    MUCINEX REACTION: asthma attacks  . Morphine And Related Other (See Comments)    Pt. States med makes her crazy  . Oxycodone-Acetaminophen Itching    TYLOX. Caused internal itching per patient   . Guaifenesin Er Other (See Comments)    MUCINEX REACTION: asthma attacks   Follow-up Information   Follow up with Elizabeth Calico, MD In 2 weeks. (for evaluation of symptoms. recommend tracking BP as antihypertensive meds changed due to angioedema)    Specialty:  Internal Medicine   Contact information:   [address] [address] [phone]        The results of significant diagnostics from this hospitalization (including imaging, microbiology, ancillary and laboratory) are listed below for reference.    Significant Diagnostic Studies: Dg Chest Port 1 View  01/21/2014   CLINICAL DATA:  Swelling of the tongue beginning tonight.  EXAM: PORTABLE CHEST - 1 VIEW  COMPARISON:  11/20/2013  FINDINGS: There is a neurostimulator pack projecting over the upper aspect of both sides of the chest. Heart size is normal. The left lung is clear. There is chronic elevation of the right hemidiaphragm with chronic volume loss at the right base. No active process evident.  IMPRESSION: Chronic elevation of the right hemidiaphragm with chronic volume loss of the right lung base.  Newly seen bilateral  neurostimulator devices projecting over the upper aspect of both sides of the chest.   Electronically Signed   By: Nelson Chimes M.D.   On: 01/21/2014 19:26    Microbiology: No results found for this or any previous visit (from the past 240 hour(s)).   Labs: Basic Metabolic Panel:  Recent Labs Lab 01/21/14 1912 01/22/14 0549  NA 134* 139  K 3.7 4.2  CL 96 101  CO2 25 24  GLUCOSE 331* 324*  BUN 17 12  CREATININE 0.79 0.55  CALCIUM 9.1 9.5   Liver Function Tests: No results found for this basename: AST, ALT, ALKPHOS, BILITOT, PROT, ALBUMIN,  in the last 168 hours No results found for this basename: LIPASE, AMYLASE,  in the last 168 hours No results found for this basename: AMMONIA,  in the last 168 hours CBC:  Recent Labs Lab 01/21/14 1912 01/22/14 0549  WBC 8.9 6.9  NEUTROABS 5.6  --   HGB 12.9 13.7  HCT 38.3 39.9  MCV 91.0 89.5  PLT 225 265   Cardiac Enzymes: No results found for this basename: CKTOTAL, CKMB, CKMBINDEX, TROPONINI,  in the last 168 hours BNP: BNP (last 3 results) No results found for this basename: PROBNP,  in the last 8760 hours CBG:  Recent Labs Lab 01/21/14 2233 01/22/14 0726 01/22/14 1150  GLUCAP 257* 306* 201*       Signed:  Dyanne Carrel M  [facility] 01/22/2014, 2:41 PM

## 2014-01-22 NOTE — Progress Notes (Signed)
1600 Pt d/c instructions given via verbal instructions with teach back. Pt confirmed understanding of scheduling F/U apt with Dr.Jones within 2wks regarding BP medications. IV catheters removed from RIGHT distal FA & RIGHT AC. Tele equipment removed from pt. Hard Rx's for Norvasc & Prednisone given to pt to take to personal pharmacy. Pt understands not to take BP medications in the ARB or ACE Inhibitor category ever again per d/c instructions. Pt escorted to vehicle via w/c by this Probation officer, husband to drive home.

## 2014-01-22 NOTE — Discharge Instructions (Signed)
Do not take medications classified as ARB or ACE inhibitor.

## 2014-01-22 NOTE — Discharge Summary (Signed)
The patient was seen and examined. Her chart and vital signs were reviewed. She was discussed with nurse practitioner, Ms. Renard Hamper. Agree with her assessment and plan with additions below:   Discharge diagnoses: 1. Angioedema secondary to angioedema-losartan. Angioedema resolved. 2. Hypertension. Amlodipine added to antihypertensive medication regimen. 3. Diabetes mellitus, type II with increase in CBG secondary to steroids. Hemoglobin A1c was pending at the time of discharge. 4. Hypothyroidism. TSH was within normal limits at 1.69. 5. Chronic essential tremor, status post deep brain stimulator surgery in August 2015 and insertion of pulse generator September 2015. 6. Chronic asthma, remains stable. 7. Tachyarrhythmia-history of AV nodal reentry.

## 2014-01-24 ENCOUNTER — Telehealth: Payer: Self-pay | Admitting: *Deleted

## 2014-01-24 NOTE — Telephone Encounter (Signed)
Pt return call bck completed TCM call below.../lmb  Transition Care Management Follow-up Telephone Call  How have you been since you were released from the hospital? Pt states she is doing much better   Do you understand why you were in the hospital? YES, she stated she understood why she was admitted   Do you understand the discharge instrcutions? YES, she understood instructions we went over summary again   Items Reviewed:   Medications reviewed: YES, reviewed  Allergies reviewed: YES, reviewed &  Updated she can not take losartan anymore med causes her tongue to swell & airway to close  Dietary changes reviewed: No  Referrals reviewed: No referral needed   Functional Questionnaire:   Activities of Daily Living (ADLs):   She states she is independent in the following: bathing and hygiene and feeding, clothing herself, walking She states she doesn't require any assistance    Any transportation issues/concerns?: NO   Any patient concerns? NO   Confirmed importance and date/time of follow-up visits scheduled: YES, made appt for 11/3 with Dr. Ronnald Ramp. Advise pt to make sure she keep appt   Confirmed with patient if condition begins to worsen call PCP or go to the ER.  Patient was given the Call-a-Nurse line [phone]: YES

## 2014-01-24 NOTE — Telephone Encounter (Signed)
Called pt concerning TCM no answer LMOM (home) to return call...[name]

## 2014-01-29 ENCOUNTER — Emergency Department (HOSPITAL_COMMUNITY)
Admission: EM | Admit: 2014-01-29 | Discharge: 2014-01-29 | Disposition: A | Discharge status: Home / Self Care | Payer: Medicare Other | Attending: Emergency Medicine | Admitting: Emergency Medicine

## 2014-01-29 ENCOUNTER — Emergency Department (HOSPITAL_COMMUNITY): Payer: Medicare Other

## 2014-01-29 ENCOUNTER — Ambulatory Visit: Payer: Self-pay | Admitting: Endocrinology

## 2014-01-29 ENCOUNTER — Encounter (HOSPITAL_COMMUNITY): Payer: Self-pay | Admitting: Emergency Medicine

## 2014-01-29 ENCOUNTER — Telehealth: Payer: Self-pay | Admitting: Internal Medicine

## 2014-01-29 DIAGNOSIS — Z8719 Personal history of other diseases of the digestive system: Secondary | ICD-10-CM | POA: Diagnosis not present

## 2014-01-29 DIAGNOSIS — E039 Hypothyroidism, unspecified: Secondary | ICD-10-CM | POA: Insufficient documentation

## 2014-01-29 DIAGNOSIS — Z792 Long term (current) use of antibiotics: Secondary | ICD-10-CM | POA: Diagnosis not present

## 2014-01-29 DIAGNOSIS — Z7982 Long term (current) use of aspirin: Secondary | ICD-10-CM | POA: Insufficient documentation

## 2014-01-29 DIAGNOSIS — F329 Major depressive disorder, single episode, unspecified: Secondary | ICD-10-CM | POA: Diagnosis not present

## 2014-01-29 DIAGNOSIS — K219 Gastro-esophageal reflux disease without esophagitis: Secondary | ICD-10-CM | POA: Diagnosis not present

## 2014-01-29 DIAGNOSIS — J45909 Unspecified asthma, uncomplicated: Secondary | ICD-10-CM | POA: Diagnosis present

## 2014-01-29 DIAGNOSIS — Z8739 Personal history of other diseases of the musculoskeletal system and connective tissue: Secondary | ICD-10-CM | POA: Insufficient documentation

## 2014-01-29 DIAGNOSIS — Z87891 Personal history of nicotine dependence: Secondary | ICD-10-CM | POA: Insufficient documentation

## 2014-01-29 DIAGNOSIS — R251 Tremor, unspecified: Secondary | ICD-10-CM | POA: Insufficient documentation

## 2014-01-29 DIAGNOSIS — Z7952 Long term (current) use of systemic steroids: Secondary | ICD-10-CM | POA: Diagnosis not present

## 2014-01-29 DIAGNOSIS — J45901 Unspecified asthma with (acute) exacerbation: Secondary | ICD-10-CM | POA: Diagnosis not present

## 2014-01-29 DIAGNOSIS — F419 Anxiety disorder, unspecified: Secondary | ICD-10-CM | POA: Diagnosis not present

## 2014-01-29 DIAGNOSIS — G40909 Epilepsy, unspecified, not intractable, without status epilepticus: Secondary | ICD-10-CM | POA: Insufficient documentation

## 2014-01-29 DIAGNOSIS — Z79899 Other long term (current) drug therapy: Secondary | ICD-10-CM | POA: Insufficient documentation

## 2014-01-29 DIAGNOSIS — Z794 Long term (current) use of insulin: Secondary | ICD-10-CM | POA: Diagnosis not present

## 2014-01-29 DIAGNOSIS — R739 Hyperglycemia, unspecified: Secondary | ICD-10-CM

## 2014-01-29 DIAGNOSIS — I10 Essential (primary) hypertension: Secondary | ICD-10-CM | POA: Insufficient documentation

## 2014-01-29 DIAGNOSIS — E1165 Type 2 diabetes mellitus with hyperglycemia: Secondary | ICD-10-CM | POA: Insufficient documentation

## 2014-01-29 LAB — CBG MONITORING, ED: Glucose-Capillary: 303 mg/dL — ABNORMAL HIGH (ref 70–99)

## 2014-01-29 MED ORDER — PREDNISONE 50 MG PO TABS: ORAL_TABLET | ORAL | Status: DC

## 2014-01-29 MED ORDER — ALBUTEROL SULFATE (2.5 MG/3ML) 0.083% IN NEBU
2.5000 mg | INHALATION_SOLUTION | Freq: Once | RESPIRATORY_TRACT | Status: AC
  Administered 2014-01-29: 2.5 mg via RESPIRATORY_TRACT
  Filled 2014-01-29: qty 3

## 2014-01-29 MED ORDER — PREDNISONE 50 MG PO TABS
60.0000 mg | ORAL_TABLET | Freq: Once | ORAL | Status: AC
  Administered 2014-01-29: 60 mg via ORAL
  Filled 2014-01-29 (×2): qty 1

## 2014-01-29 MED ORDER — IPRATROPIUM-ALBUTEROL 0.5-2.5 (3) MG/3ML IN SOLN
3.0000 mL | Freq: Once | RESPIRATORY_TRACT | Status: AC
  Administered 2014-01-29: 3 mL via RESPIRATORY_TRACT
  Filled 2014-01-29: qty 3

## 2014-01-29 MED ORDER — ALBUTEROL SULFATE (2.5 MG/3ML) 0.083% IN NEBU
5.0000 mg | INHALATION_SOLUTION | Freq: Once | RESPIRATORY_TRACT | Status: AC
  Administered 2014-01-29: 5 mg via RESPIRATORY_TRACT
  Filled 2014-01-29: qty 6

## 2014-01-29 NOTE — Discharge Instructions (Signed)

## 2014-01-29 NOTE — ED Notes (Signed)
Patient c/o increased shortness of breath and wheezing since yesterday.

## 2014-01-29 NOTE — Telephone Encounter (Signed)
Called and spoke to pt. Appt made for CY at 10/29. Pt verbalized understanding and denied any further questions or concerns at this time.

## 2014-01-29 NOTE — ED Provider Notes (Signed)
CSN: [id number]     Arrival date & time 01/29/14  0001 History  This chart was scribed for Sharyon Cable, MD by Molli Posey, ED Scribe. This patient was seen in room APA18/APA18 and the patient's care was started 12:25 AM.    Chief Complaint  Patient presents with  . Asthma    The history is provided by the patient. No language interpreter was used.  HPI Comments: [name] is a 63 y.o. female who presents to the Emergency Department complaining of constant SOB that started yesterday. She reports associated wheezing and cough as symptoms. She reports she has been using her inhaler but it has failed to relieve her symptoms. She denies fevers, CP, back pain, vomiting and leg swelling as symptoms. She reports no modifying factors at this time.   PCP Ronnald Ramp   Past Medical History  Diagnosis Date  . 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 [name], residual palpitations  . Vocal cord dysfunction   . Tremor, essential     takes Mysoline and Neurontin daily.  . Allergic rhinitis     uses Nasonex daily as needed  . Low sodium syndrome   . Chronic back pain     reason unknown  . Lumbar radiculopathy   . DDD (degenerative disc disease), lumbar   . Esophageal reflux     takes Zantac daily  . Hypertension     takes Losartan daily  . Anxiety     takes Ativan daily as needed  . Hypothyroidism     takes Synthroid daily  . Depression     takes Zoloft daily as well as Cymbalta  . Diabetes mellitus     takes NOvolin daily  . Asthma     Albuterol inhaler and Neb daily as needed  . History of bronchitis Dec 2014  . Seizures 04-05-13    one time ran out of Primidone and had stopped it cold [place]  . Weakness     in left arm  . Joint pain   . Joint swelling     left ankle  . Dysphagia   . Constipation   . Allergy to angiotensin receptor blockers (ARB) 01/22/2014    Angioedema   Past Surgical History  Procedure Laterality Date  .  Tonsillectomy    . Right breast cyst      benign  . Left ankle ligament repair      x 2  . Left elbow repair    . Partial hysterectomy    . Cholecystectomy    . Cesarean section    . Ablation for avnrt    . Colonoscopy  01/16/2004    WUJ:[id number] rectum/colon  . Esophagogastroduodenoscopy (egd) with esophageal dilation  04/03/2002    NWG:[id number]'M ring, otherwise normal esophagus, status post dilation with 56 F/Normal stomach  . Bartholin gland cyst excision      x 2  . Esophagogastroduodenoscopy (egd) with esophageal dilation N/A 11/08/2012    Procedure: ESOPHAGOGASTRODUODENOSCOPY (EGD) WITH ESOPHAGEAL DILATION;  Surgeon: Daneil Dolin, MD;  Location: [facility];  Service: Endoscopy;  Laterality: N/A;  11:30  . Subthalamic stimulator insertion Bilateral 12/04/2013    Procedure: Bilateral Deep brain stimulator placement;  Surgeon: [name], MD;  Location: [place] [facility];  Service: Neurosurgery;  Laterality: Bilateral;  Bilateral Deep brain stimulator placement  . Pulse generator implant Bilateral 12/15/2013    Procedure: BILATERAL PULSE GENERATOR IMPLANT;  Surgeon: [name], MD;  Location: [facility];  Service: Neurosurgery;  Laterality: Bilateral;  BILATERAL   Family History  Problem Relation Age of Onset  . Lung cancer Father     DIED AGE 75 LUNG CA  . Alcohol abuse Father   . Anxiety disorder Father   . Depression Father   . COPD Mother     DIED AGE 22,MRSA,COPD,PNEUMONIA  . Pneumonia Mother     DIED AGE 22,MRSA,COPD,PNEUMONIA  . Supraventricular tachycardia Mother   . Anxiety disorder Mother   . Depression Mother   . Alcohol abuse Mother   . COPD Brother     AGE 3  . Heart disease Sister     DIED AGE 48, SMOKER,?HEART  . Colon cancer Neg Hx    History  Substance Use Topics  . Smoking status: Former Smoker -- 0.30 packs/day for 10 years    Types: Cigarettes  . Smokeless tobacco: Never Used     Comment: quit smoking 25+yrs ago  . Alcohol Use: No      Comment: no alcohol in 3yrs   OB History   Grav Para Term Preterm Abortions TAB SAB Ect Mult Living                 Review of Systems  Constitutional: Negative for fever.  Respiratory: Positive for shortness of breath and wheezing.   Cardiovascular: Negative for chest pain and leg swelling.  Gastrointestinal: Negative for vomiting and abdominal pain.  Musculoskeletal: Negative for back pain.  Neurological: Positive for tremors.  All other systems reviewed and are negative.     Allergies  Losartan; Metformin and related; Guaifenesin; Morphine and related; Oxycodone-acetaminophen; and Guaifenesin er  Home Medications   Prior to Admission medications   Medication Sig Start Date End Date Taking? Authorizing Provider  albuterol (PROAIR HFA) 108 (90 BASE) MCG/ACT inhaler Inhale 2 puffs into the lungs every 6 (six) hours as needed for wheezing or shortness of breath. 08/03/13   Tammy S Parrett, NP  albuterol (PROVENTIL) (2.5 MG/3ML) 0.083% nebulizer solution Take 2.5 mg by nebulization every 6 (six) hours as needed for wheezing or shortness of breath.    Historical Provider, MD  amLODipine (NORVASC) 10 MG tablet Take 1 tablet (10 mg total) by mouth daily. 01/22/14   Radene Gunning, NP  aspirin EC 81 MG tablet Take 81 mg by mouth every morning.     Historical Provider, MD  cephALEXin (KEFLEX) 500 MG capsule Take 1 capsule (500 mg total) by mouth 3 (three) times daily. 01/10/14   Eustace Quail Tat, DO  DULoxetine (CYMBALTA) 60 MG capsule Take 1 capsule (60 mg total) by mouth daily. 11/07/13 11/07/14  Levonne Spiller, MD  gabapentin (NEURONTIN) 100 MG capsule Take 200 mg by mouth at bedtime.     Historical Provider, MD  HYDROcodone-acetaminophen (NORCO/VICODIN) 5-325 MG per tablet Take 1-2 tablets by mouth every 6 (six) hours as needed for moderate pain. 11/20/13   Janith Lima, MD  insulin NPH-regular Human (NOVOLIN 70/30) (70-30) 100 UNIT/ML injection Inject 40-90 Units into the skin 2 (two) times  daily with a meal. 90 units with breakfast and 40 units with evening meal and syringes 2/day. 10/13/13   Renato Shin, MD  levothyroxine (SYNTHROID, LEVOTHROID) 75 MCG tablet Take 75 mcg by mouth daily before breakfast.    Historical Provider, MD  metoprolol succinate (TOPROL-XL) 50 MG 24 hr tablet Take 1 tablet (50 mg total) by mouth 2 (two) times daily. Take with or immediately following a meal. 05/04/13  Janith Lima, MD  predniSONE (DELTASONE) 10 MG tablet Take 1 tab every 12 hours for 2 days starting 01/23/14. 01/22/14   Radene Gunning, NP  primidone (MYSOLINE) 50 MG tablet Take 3 tablets (150 mg total) by mouth 2 (two) times daily. 06/12/13   Janith Lima, MD  ranitidine (ZANTAC) 150 MG tablet Take 150 mg by mouth 2 (two) times daily.     Historical Provider, MD   BP 154/85  Pulse 120  Temp(Src) 98.9 F (37.2 C) (Oral)  Resp 24  Ht 5\' 4"  (1.626 m)  Wt 193 lb (87.544 kg)  BMI 33.11 kg/m2  SpO2 95% Physical Exam  Nursing note and vitals reviewed. CONSTITUTIONAL: Well developed/well nourished HEAD: Normocephalic/atraumatic EYES: EOMI/PERRL ENMT: Mucous membranes moist, no angioedema noted NECK: supple no meningeal signs SPINE:entire spine nontender CV: S1/S2 noted, no murmurs/rubs/gallops noted LUNGS: Bilateral wheezing.  ABDOMEN: soft, nontender, no rebound or guarding GU:no cva tenderness NEURO: Pt is awake/alert, moves all extremitiesx4, bilateral upper extremity tremor noted EXTREMITIES: pulses normal, full ROM, no LE edema noted SKIN: warm, color normal PSYCH: no abnormalities of mood noted  ED Course  Procedures  DIAGNOSTIC STUDIES: Oxygen Saturation is 95% on RA, normal by my interpretation.    COORDINATION OF CARE: 12:30 AM Discussed treatment plan with pt at bedside and pt agreed to plan. 1:38 AM Pt feeling improved but still has wheeze Will give another albuterol treatment She is otherwise at her baseline and well appearing She understands need to closely  monitor glucose at home while taking prednisone 2:36 AM Pt improved Her wheeze has resolved She denies CP She is not hypoxic (pulse ox >95% on RA) She is tachycardic, though I suspect from albuterol and she reports h/o "white coat syndrome" I have low suspicion for acute PE/ACS She will continue albuterol at home.  Will use prednisone for 4 days and we discussed need to closely monitor glucose at home and may need to adjust her insulin Labs Review Labs Reviewed  CBG MONITORING, ED - Abnormal; Notable for the following:    Glucose-Capillary 303 (*)    All other components within normal limits    Imaging Review Dg Chest 2 View (if Patient Has Fever And/or Copd)  01/29/2014   CLINICAL DATA:  Increased shortness of breath and wheezing. Initial encounter.  EXAM: CHEST  2 VIEW  COMPARISON:  01/21/2014  FINDINGS: No cardiomegaly.  Negative aortic contours.  There is worsening opacification the right base with bandlike opacity. Morphology suggests multi segment atelectasis. The right diaphragm remains elevated. No edema, consolidation, effusion, or pneumothorax.  Deep brain stimulator bilaterally with battery pack in the anterior chest wall.  IMPRESSION: Right basilar atelectasis, increased from 01/21/2014.   Electronically Signed   By: Jorje Guild M.D.   On: 01/29/2014 00:53     EKG Interpretation   Date/Time:  Monday January 29 2014 00:44:07 EDT Ventricular Rate:  116 PR Interval:  155 QRS Duration: 79 QT Interval:  324 QTC Calculation: 450 R Axis:   80 Text Interpretation:  Sinus tachycardia ECG OTHERWISE WITHIN NORMAL LIMITS  No significant change since last tracing Confirmed by Christy Gentles  MD, Elenore Rota  [phone]) on 01/29/2014 1:03:38 AM      MDM   Final diagnoses:  Hyperglycemia  Asthma attack    Nursing notes including past medical history and social history reviewed and considered in documentation xrays reviewed and considered Labs/vital reviewed and considered   I  personally performed the services described in this documentation,  which was scribed in my presence. The recorded information has been reviewed and is accurate.      Sharyon Cable, MD 01/29/14 [phone]

## 2014-01-30 ENCOUNTER — Emergency Department (HOSPITAL_COMMUNITY)
Admission: EM | Admit: 2014-01-30 | Discharge: 2014-01-30 | Disposition: A | Discharge status: Home / Self Care | Payer: Medicare Other | Attending: Emergency Medicine | Admitting: Emergency Medicine

## 2014-01-30 ENCOUNTER — Encounter (HOSPITAL_COMMUNITY): Payer: Self-pay | Admitting: Emergency Medicine

## 2014-01-30 DIAGNOSIS — Z79899 Other long term (current) drug therapy: Secondary | ICD-10-CM | POA: Insufficient documentation

## 2014-01-30 DIAGNOSIS — I10 Essential (primary) hypertension: Secondary | ICD-10-CM | POA: Insufficient documentation

## 2014-01-30 DIAGNOSIS — E039 Hypothyroidism, unspecified: Secondary | ICD-10-CM | POA: Insufficient documentation

## 2014-01-30 DIAGNOSIS — Z7982 Long term (current) use of aspirin: Secondary | ICD-10-CM | POA: Insufficient documentation

## 2014-01-30 DIAGNOSIS — Z792 Long term (current) use of antibiotics: Secondary | ICD-10-CM | POA: Insufficient documentation

## 2014-01-30 DIAGNOSIS — G8929 Other chronic pain: Secondary | ICD-10-CM | POA: Diagnosis not present

## 2014-01-30 DIAGNOSIS — Z794 Long term (current) use of insulin: Secondary | ICD-10-CM | POA: Insufficient documentation

## 2014-01-30 DIAGNOSIS — E860 Dehydration: Secondary | ICD-10-CM

## 2014-01-30 DIAGNOSIS — G40909 Epilepsy, unspecified, not intractable, without status epilepticus: Secondary | ICD-10-CM | POA: Diagnosis not present

## 2014-01-30 DIAGNOSIS — J45909 Unspecified asthma, uncomplicated: Secondary | ICD-10-CM | POA: Insufficient documentation

## 2014-01-30 DIAGNOSIS — R739 Hyperglycemia, unspecified: Secondary | ICD-10-CM

## 2014-01-30 DIAGNOSIS — G250 Essential tremor: Secondary | ICD-10-CM | POA: Insufficient documentation

## 2014-01-30 DIAGNOSIS — Z87891 Personal history of nicotine dependence: Secondary | ICD-10-CM | POA: Diagnosis not present

## 2014-01-30 DIAGNOSIS — K219 Gastro-esophageal reflux disease without esophagitis: Secondary | ICD-10-CM | POA: Insufficient documentation

## 2014-01-30 DIAGNOSIS — M5136 Other intervertebral disc degeneration, lumbar region: Secondary | ICD-10-CM | POA: Diagnosis not present

## 2014-01-30 DIAGNOSIS — F329 Major depressive disorder, single episode, unspecified: Secondary | ICD-10-CM | POA: Diagnosis not present

## 2014-01-30 DIAGNOSIS — R05 Cough: Secondary | ICD-10-CM | POA: Diagnosis not present

## 2014-01-30 DIAGNOSIS — T783XXA Angioneurotic edema, initial encounter: Secondary | ICD-10-CM

## 2014-01-30 DIAGNOSIS — F419 Anxiety disorder, unspecified: Secondary | ICD-10-CM | POA: Insufficient documentation

## 2014-01-30 DIAGNOSIS — E1165 Type 2 diabetes mellitus with hyperglycemia: Secondary | ICD-10-CM | POA: Diagnosis present

## 2014-01-30 LAB — COMPREHENSIVE METABOLIC PANEL
ALT: 27 U/L (ref 0–35)
AST: 24 U/L (ref 0–37)
Albumin: 3.7 g/dL (ref 3.5–5.2)
Alkaline Phosphatase: 125 U/L — ABNORMAL HIGH (ref 39–117)
Anion gap: 14 (ref 5–15)
BUN: 20 mg/dL (ref 6–23)
CO2: 23 mEq/L (ref 19–32)
Calcium: 8.9 mg/dL (ref 8.4–10.5)
Chloride: 91 mEq/L — ABNORMAL LOW (ref 96–112)
Creatinine, Ser: 0.72 mg/dL (ref 0.50–1.10)
GFR calc Af Amer: >90 mL/min (ref >90)
GFR calc non Af Amer: 88 mL/min — ABNORMAL LOW (ref >90)
Glucose, Bld: 428 mg/dL — ABNORMAL HIGH (ref 70–99)
Potassium: 4.7 mEq/L (ref 3.7–5.3)
Sodium: 128 mEq/L — ABNORMAL LOW (ref 137–147)
Total Bilirubin: <0.2 mg/dL — ABNORMAL LOW (ref 0.3–1.2)
Total Protein: 7 g/dL (ref 6.0–8.3)

## 2014-01-30 LAB — CBG MONITORING, ED
Glucose-Capillary: 426 mg/dL — ABNORMAL HIGH (ref 70–99)
Glucose-Capillary: 361 mg/dL — ABNORMAL HIGH (ref 70–99)
Glucose-Capillary: 341 mg/dL — ABNORMAL HIGH (ref 70–99)
Glucose-Capillary: 338 mg/dL — ABNORMAL HIGH (ref 70–99)
Glucose-Capillary: 230 mg/dL — ABNORMAL HIGH (ref 70–99)

## 2014-01-30 LAB — CBC WITH DIFFERENTIAL/PLATELET
Basophils Absolute: 0 10*3/uL (ref 0.0–0.1)
Basophils Relative: 0 % (ref 0–1)
Eosinophils Absolute: 0 10*3/uL (ref 0.0–0.7)
Eosinophils Relative: 0 % (ref 0–5)
HCT: 38.4 % (ref 36.0–46.0)
Hemoglobin: 12.7 g/dL (ref 12.0–15.0)
Lymphocytes Relative: 28 % (ref 12–46)
Lymphs Abs: 1.6 10*3/uL (ref 0.7–4.0)
MCH: 29.7 pg (ref 26.0–34.0)
MCHC: 33.1 g/dL (ref 30.0–36.0)
MCV: 89.7 fL (ref 78.0–100.0)
Monocytes Absolute: 0.6 10*3/uL (ref 0.1–1.0)
Monocytes Relative: 10 % (ref 3–12)
Neutro Abs: 3.6 10*3/uL (ref 1.7–7.7)
Neutrophils Relative %: 62 % (ref 43–77)
Platelets: 203 10*3/uL (ref 150–400)
RBC: 4.28 MIL/uL (ref 3.87–5.11)
RDW: 12.6 % (ref 11.5–15.5)
WBC: 5.7 10*3/uL (ref 4.0–10.5)

## 2014-01-30 MED ORDER — INSULIN ASPART 100 UNIT/ML ~~LOC~~ SOLN
10.0000 [IU] | Freq: Once | SUBCUTANEOUS | Status: AC
  Administered 2014-01-30: 10 [IU] via SUBCUTANEOUS
  Filled 2014-01-30: qty 1

## 2014-01-30 MED ORDER — SODIUM CHLORIDE 0.9 % IV BOLUS (SEPSIS)
500.0000 mL | Freq: Once | INTRAVENOUS | Status: AC
  Administered 2014-01-30: 500 mL via INTRAVENOUS

## 2014-01-30 MED ORDER — INSULIN ASPART 100 UNIT/ML ~~LOC~~ SOLN
5.0000 [IU] | Freq: Once | SUBCUTANEOUS | Status: AC
Start: 2014-01-30 — End: 2014-01-30
  Administered 2014-01-30: 5 [IU] via SUBCUTANEOUS
  Filled 2014-01-30: qty 1

## 2014-01-30 MED ORDER — ALBUTEROL SULFATE HFA 108 (90 BASE) MCG/ACT IN AERS: 1.0000 | INHALATION_SPRAY | Freq: Four times a day (QID) | RESPIRATORY_TRACT | Status: DC | PRN

## 2014-01-30 NOTE — ED Notes (Signed)
cbg is 361

## 2014-01-30 NOTE — Discharge Instructions (Signed)
PLEASE STOP YOUR STEROIDS USE YOUR ALBUTEROL FREQUENTLY FOR YOUR COUGH BE SURE TO CHECK YOUR BLOOD SUGAR FREQUENTLY.

## 2014-01-30 NOTE — ED Notes (Signed)
Pt. reports elevated blood sugar this evening =502 , states feeling thirsty , currently taking Prednisone prescribed for her asthma.

## 2014-01-30 NOTE — ED Notes (Signed)
CBG results given to [place] by B. Yolanda Bonine, EMT 426 mg/dL

## 2014-01-30 NOTE — ED Notes (Signed)
Dr. Wickline is at the bedside 

## 2014-01-30 NOTE — ED Provider Notes (Signed)
CSN: [id number]     Arrival date & time 01/30/14  0120 History   First MD Initiated Contact with Patient 01/30/14 0441     Chief Complaint  Patient presents with  . Hyperglycemia     Patient is a 63 y.o. female presenting with hyperglycemia. The history is provided by the patient.  Hyperglycemia Severity:  Moderate Onset quality:  Gradual Duration:  1 day Timing:  Constant Progression:  Worsening Chronicity:  New Diabetes status:  Controlled with insulin Context comment:  Prednisone use Relieved by:  Nothing Associated symptoms: increased thirst and polyuria   Associated symptoms: no abdominal pain, no chest pain, no dizziness, no fever and no vomiting   patient has been taking prednisone for her recent asthma flare (has taken two doses) and noted her glucose was elevated.  She also felt increased thirst. No fever/vomiting No abd pain No worsening SOB.   She has no other new symptoms   Past Medical History  Diagnosis Date  . 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 [name], residual palpitations  . Vocal cord dysfunction   . Tremor, essential     takes Mysoline and Neurontin daily.  . Allergic rhinitis     uses Nasonex daily as needed  . Low sodium syndrome   . Chronic back pain     reason unknown  . Lumbar radiculopathy   . DDD (degenerative disc disease), lumbar   . Esophageal reflux     takes Zantac daily  . Hypertension     takes Losartan daily  . Anxiety     takes Ativan daily as needed  . Hypothyroidism     takes Synthroid daily  . Depression     takes Zoloft daily as well as Cymbalta  . Diabetes mellitus     takes NOvolin daily  . Asthma     Albuterol inhaler and Neb daily as needed  . History of bronchitis Dec 2014  . Seizures 04-05-13    one time ran out of Primidone and had stopped it cold [place]  . Weakness     in left arm  . Joint pain   . Joint swelling     left ankle  . Dysphagia   . Constipation    . Allergy to angiotensin receptor blockers (ARB) 01/22/2014    Angioedema   Past Surgical History  Procedure Laterality Date  . Tonsillectomy    . Right breast cyst      benign  . Left ankle ligament repair      x 2  . Left elbow repair    . Partial hysterectomy    . Cholecystectomy    . Cesarean section    . Ablation for avnrt    . Colonoscopy  01/16/2004    ERD:EYCXKG rectum/colon  . Esophagogastroduodenoscopy (egd) with esophageal dilation  04/03/2002    YJE:HUDJSHFW'Y ring, otherwise normal esophagus, status post dilation with 56 F/Normal stomach  . Bartholin gland cyst excision      x 2  . Esophagogastroduodenoscopy (egd) with esophageal dilation N/A 11/08/2012    Procedure: ESOPHAGOGASTRODUODENOSCOPY (EGD) WITH ESOPHAGEAL DILATION;  Surgeon: Daneil Dolin, MD;  Location: [facility];  Service: Endoscopy;  Laterality: N/A;  11:30  . Subthalamic stimulator insertion Bilateral 12/04/2013    Procedure: Bilateral Deep brain stimulator placement;  Surgeon: [name], MD;  Location: [place] [facility];  Service: Neurosurgery;  Laterality: Bilateral;  Bilateral Deep brain stimulator placement  .  Pulse generator implant Bilateral 12/15/2013    Procedure: BILATERAL PULSE GENERATOR IMPLANT;  Surgeon: [name], MD;  Location: [facility];  Service: Neurosurgery;  Laterality: Bilateral;  BILATERAL   Family History  Problem Relation Age of Onset  . Lung cancer Father     DIED AGE 20 LUNG CA  . Alcohol abuse Father   . Anxiety disorder Father   . Depression Father   . COPD Mother     DIED AGE 23,MRSA,COPD,PNEUMONIA  . Pneumonia Mother     DIED AGE 23,MRSA,COPD,PNEUMONIA  . Supraventricular tachycardia Mother   . Anxiety disorder Mother   . Depression Mother   . Alcohol abuse Mother   . COPD Brother     AGE 21  . Heart disease Sister     DIED AGE 29, SMOKER,?HEART  . Colon cancer Neg Hx    History  Substance Use Topics  . Smoking status: Former Smoker -- 0.30 packs/day for  10 years    Types: Cigarettes  . Smokeless tobacco: Never Used     Comment: quit smoking 25+yrs ago  . Alcohol Use: No     Comment: no alcohol in 62yrs   OB History   Grav Para Term Preterm Abortions TAB SAB Ect Mult Living                 Review of Systems  Constitutional: Negative for fever.  Respiratory: Positive for cough.   Cardiovascular: Negative for chest pain.  Gastrointestinal: Negative for vomiting and abdominal pain.  Endocrine: Positive for polydipsia and polyuria.  Neurological: Negative for dizziness and weakness.  All other systems reviewed and are negative.     Allergies  Losartan; Metformin and related; Guaifenesin; Morphine and related; Oxycodone-acetaminophen; and Guaifenesin er  Home Medications   Prior to Admission medications   Medication Sig Start Date End Date Taking? Authorizing Provider  albuterol (PROAIR HFA) 108 (90 BASE) MCG/ACT inhaler Inhale 2 puffs into the lungs every 6 (six) hours as needed for wheezing or shortness of breath. 08/03/13  Yes Tammy S Parrett, NP  albuterol (PROVENTIL) (2.5 MG/3ML) 0.083% nebulizer solution Take 2.5 mg by nebulization every 6 (six) hours as needed for wheezing or shortness of breath.   Yes Historical Provider, MD  amLODipine (NORVASC) 10 MG tablet Take 1 tablet (10 mg total) by mouth daily. 01/22/14  Yes Radene Gunning, NP  aspirin EC 81 MG tablet Take 81 mg by mouth every morning.    Yes Historical Provider, MD  cephALEXin (KEFLEX) 500 MG capsule Take 1 capsule (500 mg total) by mouth 3 (three) times daily. 01/10/14  Yes Rebecca S Tat, DO  DULoxetine (CYMBALTA) 60 MG capsule Take 1 capsule (60 mg total) by mouth daily. 11/07/13 11/07/14 Yes Levonne Spiller, MD  gabapentin (NEURONTIN) 100 MG capsule Take 300 mg by mouth at bedtime.   Yes Historical Provider, MD  HYDROcodone-acetaminophen (NORCO/VICODIN) 5-325 MG per tablet Take 1-2 tablets by mouth every 6 (six) hours as needed for moderate pain. 11/20/13  Yes Janith Lima, MD  insulin NPH-regular Human (NOVOLIN 70/30) (70-30) 100 UNIT/ML injection Inject 40-90 Units into the skin 2 (two) times daily with a meal. 90 units with breakfast and 40 units with evening meal and syringes 2/day. 10/13/13  Yes Renato Shin, MD  levothyroxine (SYNTHROID, LEVOTHROID) 75 MCG tablet Take 75 mcg by mouth daily before breakfast.   Yes Historical Provider, MD  metoprolol succinate (TOPROL-XL) 50 MG 24 hr tablet Take 1 tablet (50 mg total)  by mouth 2 (two) times daily. Take with or immediately following a meal. 05/04/13  Yes Janith Lima, MD  primidone (MYSOLINE) 50 MG tablet Take 3 tablets (150 mg total) by mouth 2 (two) times daily. 06/12/13  Yes Janith Lima, MD  ranitidine (ZANTAC) 150 MG tablet Take 150 mg by mouth 2 (two) times daily.    Yes Historical Provider, MD   BP 145/81  Pulse 93  Temp(Src) 98 F (36.7 C) (Oral)  Resp 21  Ht 5' 4.5" (1.638 m)  Wt 193 lb (87.544 kg)  BMI 32.63 kg/m2  SpO2 99% Physical Exam CONSTITUTIONAL: Well developed/well nourished HEAD: Normocephalic/atraumatic EYES: EOMI/PERRL ENMT: Mucous membranes moist NECK: supple no meningeal signs SPINE:entire spine nontender CV: S1/S2 noted, no murmurs/rubs/gallops noted LUNGS: scattered wheeze noted, no apparent distress ABDOMEN: soft, nontender, no rebound or guarding NEURO: Pt is awake/alert, moves all extremitiesx4, tremor noted bilateral UE EXTREMITIES: pulses normal, full ROM SKIN: warm, color normal PSYCH: no abnormalities of mood noted  ED Course  Procedures  Labs Review Labs Reviewed  COMPREHENSIVE METABOLIC PANEL - Abnormal; Notable for the following:    Sodium 128 (*)    Chloride 91 (*)    Glucose, Bld 428 (*)    Alkaline Phosphatase 125 (*)    Total Bilirubin <0.2 (*)    GFR calc non Af Amer 88 (*)    All other components within normal limits  CBG MONITORING, ED - Abnormal; Notable for the following:    Glucose-Capillary 426 (*)    All other components within normal  limits  CBG MONITORING, ED - Abnormal; Notable for the following:    Glucose-Capillary 361 (*)    All other components within normal limits  CBG MONITORING, ED - Abnormal; Notable for the following:    Glucose-Capillary 341 (*)    All other components within normal limits  CBG MONITORING, ED - Abnormal; Notable for the following:    Glucose-Capillary 338 (*)    All other components within normal limits  CBC WITH DIFFERENTIAL   Pt with steroid induced hyperlgycemia without acidosis She is well appearing, no distress IV fluids and insulin have been ordered  6:56 AM PT IMPROVED SHE IS WELL APPEARING ADVISED TO D/C PREDNISONE   MDM   Final diagnoses:  Hyperglycemia  Dehydration    Nursing notes including past medical history and social history reviewed and considered in documentation Labs/vital reviewed and considered     Sharyon Cable, MD 01/30/14 [phone]

## 2014-01-30 NOTE — ED Notes (Signed)
Discussed CBG of 230 and plan of care with Dr. Christy Gentles. Prepare for patient to be discharged.

## 2014-02-01 ENCOUNTER — Ambulatory Visit (INDEPENDENT_AMBULATORY_CARE_PROVIDER_SITE_OTHER): Payer: Medicare Other | Admitting: Internal Medicine

## 2014-02-01 VITALS — BP 142/80 | HR 106 | Ht 64.5 in | Wt 197.8 lb

## 2014-02-01 DIAGNOSIS — J4520 Mild intermittent asthma, uncomplicated: Secondary | ICD-10-CM | POA: Diagnosis not present

## 2014-02-01 DIAGNOSIS — E119 Type 2 diabetes mellitus without complications: Secondary | ICD-10-CM | POA: Diagnosis not present

## 2014-02-01 DIAGNOSIS — IMO0001 Reserved for inherently not codable concepts without codable children: Secondary | ICD-10-CM

## 2014-02-01 MED ORDER — LEVALBUTEROL HCL 0.63 MG/3ML IN NEBU: 0.6300 mg | INHALATION_SOLUTION | Freq: Once | RESPIRATORY_TRACT | Status: DC

## 2014-02-01 MED ORDER — ALBUTEROL SULFATE (2.5 MG/3ML) 0.083% IN NEBU: 2.5000 mg | INHALATION_SOLUTION | Freq: Four times a day (QID) | RESPIRATORY_TRACT | Status: DC | PRN

## 2014-02-01 MED ORDER — FLUTTER DEVI: Status: DC
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
## 2014-02-01 NOTE — Patient Instructions (Signed)
Order- Flutter device    Blow through 4 times per set, 4 sets per day to help clear congestion  Neb xop 0.63  Deep breaths and coughing from time to time will also help you re-expand that lung

## 2014-02-02 ENCOUNTER — Ambulatory Visit (INDEPENDENT_AMBULATORY_CARE_PROVIDER_SITE_OTHER): Payer: Medicare Other | Admitting: Endocrinology

## 2014-02-02 ENCOUNTER — Encounter: Payer: Self-pay | Admitting: Endocrinology

## 2014-02-02 VITALS — BP 132/86 | HR 107 | Temp 97.9°F | Ht 64.5 in | Wt 197.0 lb

## 2014-02-02 DIAGNOSIS — E119 Type 2 diabetes mellitus without complications: Secondary | ICD-10-CM

## 2014-02-02 NOTE — Patient Instructions (Addendum)
check your blood sugar 2 times a day.  vary the time of day when you check, between before the 3 meals, and at bedtime.  also check if you have symptoms of your blood sugar being too high or too low.  please keep a record of the readings and bring it to your next appointment here.  please call us sooner if your blood sugar goes below 70, or if you have a lot of readings over 200.  Please feel free to make comments on the record. Please continue "70/30" insulin, 90 units with breakfast, and 40 units with the evening meal.   For during and 1 week after any steroids (shot or pills), take extra insulin:  If in the 200's, 10 extra.  If in the 300's, take 20 extra.  if over 400, please call us.  On this type of insulin schedule, you should eat meals on a regular schedule.  If a meal is missed or significantly delayed, your blood sugar could go low.  Please come back for a follow-up appointment in 3 months.

## 2014-02-02 NOTE — Progress Notes (Signed)
Subjective:    Patient ID: [name], female    DOB: 02-19-1951, 63 y.o.   MRN: [id number]  HPI  Pt returns for f/u of diabetes mellitus: DM type: adult-onset type 1 [id number] Complications: none Therapy: insulin since 2012 GDM: never DKA: once, in early 2015 after she ran out of insulin.   Severe hypoglycemia:  last episode was in may of 2015 (twice), due to aggressive dietary efforts Pancreatitis: never Other: in November of 2014, she was changed to a simpler insulin regimen, due to poor results with multiple daily injections Interval history: She was recently rx'ed prednisone for asthma exacerbation, but she finished 4 days ago.  She was seen in ER for severe hyperglycemia, but not DKA.   no cbg record, but states cbg's have improved to the 200's.   Past Medical History  Diagnosis Date  . 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 [name], residual palpitations  . Vocal cord dysfunction   . Tremor, essential     takes Mysoline and Neurontin daily.  . Allergic rhinitis     uses Nasonex daily as needed  . Low sodium syndrome   . Chronic back pain     reason unknown  . Lumbar radiculopathy   . DDD (degenerative disc disease), lumbar   . Esophageal reflux     takes Zantac daily  . Hypertension     takes Losartan daily  . Anxiety     takes Ativan daily as needed  . Hypothyroidism     takes Synthroid daily  . Depression     takes Zoloft daily as well as Cymbalta  . Diabetes mellitus     takes NOvolin daily  . Asthma     Albuterol inhaler and Neb daily as needed  . History of bronchitis Dec 2014  . Seizures 04-05-13    one time ran out of Primidone and had stopped it cold [place]  . Weakness     in left arm  . Joint pain   . Joint swelling     left ankle  . Dysphagia   . Constipation   . Allergy to angiotensin receptor blockers (ARB) 01/22/2014    Angioedema    Past Surgical History  Procedure Laterality Date  .  Tonsillectomy    . Right breast cyst      benign  . Left ankle ligament repair      x 2  . Left elbow repair    . Partial hysterectomy    . Cholecystectomy    . Cesarean section    . Ablation for avnrt    . Colonoscopy  01/16/2004    DEY:CXKGYJ rectum/colon  . Esophagogastroduodenoscopy (egd) with esophageal dilation  04/03/2002    EHU:[id number] ring, otherwise normal esophagus, status post dilation with 54 F/Normal stomach  . Bartholin gland cyst excision      x 2  . Esophagogastroduodenoscopy (egd) with esophageal dilation N/A 11/08/2012    Procedure: ESOPHAGOGASTRODUODENOSCOPY (EGD) WITH ESOPHAGEAL DILATION;  Surgeon: Daneil Dolin, MD;  Location: [facility] ENDO SUITE;  Service: Endoscopy;  Laterality: N/A;  11:30  . Subthalamic stimulator insertion Bilateral 12/04/2013    Procedure: Bilateral Deep brain stimulator placement;  Surgeon: [name], MD;  Location: [place] [facility]URO ORS;  Service: Neurosurgery;  Laterality: Bilateral;  Bilateral Deep brain stimulator placement  . Pulse generator implant Bilateral 12/15/2013    Procedure: BILATERAL PULSE GENERATOR IMPLANT;  Surgeon: [name], MD;  Location: [place]  ORS;  Service: Neurosurgery;  Laterality: Bilateral;  BILATERAL    History   Social History  . Marital Status: Married    Spouse Name: N/A    Number of Children: N/A  . Years of Education: N/A   Occupational History  . retired    Social History Main Topics  . Smoking status: Former Smoker -- 0.30 packs/day for 10 years    Types: Cigarettes  . Smokeless tobacco: Never Used     Comment: quit smoking 25+yrs ago  . Alcohol Use: No     Comment: no alcohol in 13yrs  . Drug Use: No  . Sexual Activity: No   Other Topics Concern  . Not on file   Social History Narrative   Married, 1 daughte, living.  Lives with husband in [place], [place].   1 child died brain tumor at age 26.   Two grandchildren.   Works at [employer], patient currently lost her job.   Retired 2011.     No tobacco.   Alcohol: none in 30 yrs.  Distant history of heavy use.   No drug use.    Current Outpatient Prescriptions on File Prior to Visit  Medication Sig Dispense Refill  . albuterol (PROAIR HFA) 108 (90 BASE) MCG/ACT inhaler Inhale 2 puffs into the lungs every 6 (six) hours as needed for wheezing or shortness of breath. 8.5 g 5  . albuterol (PROVENTIL) (2.5 MG/3ML) 0.083% nebulizer solution Take 3 mLs (2.5 mg total) by nebulization every 6 (six) hours as needed for wheezing or shortness of breath. 360 mL 3  . amLODipine (NORVASC) 10 MG tablet Take 1 tablet (10 mg total) by mouth daily. 30 tablet 0  . aspirin EC 81 MG tablet Take 81 mg by mouth every morning.     . DULoxetine (CYMBALTA) 60 MG capsule Take 1 capsule (60 mg total) by mouth daily. 30 capsule 2  . gabapentin (NEURONTIN) 100 MG capsule Take 300 mg by mouth at bedtime.    Marland Kitchen HYDROcodone-acetaminophen (NORCO/VICODIN) 5-325 MG per tablet Take 1-2 tablets by mouth every 6 (six) hours as needed for moderate pain. 100 tablet 0  . insulin NPH-regular Human (NOVOLIN 70/30) (70-30) 100 UNIT/ML injection 90 units with breakfast and 40 units with evening meal and syringes 2/day.    . levothyroxine (SYNTHROID, LEVOTHROID) 75 MCG tablet Take 75 mcg by mouth daily before breakfast.    . metoprolol succinate (TOPROL-XL) 50 MG 24 hr tablet Take 1 tablet (50 mg total) by mouth 2 (two) times daily. Take with or immediately following a meal. 60 tablet 11  . primidone (MYSOLINE) 50 MG tablet Take 3 tablets (150 mg total) by mouth 2 (two) times daily. 180 tablet 5  . ranitidine (ZANTAC) 150 MG tablet Take 150 mg by mouth 2 (two) times daily.     Marland Kitchen Respiratory Therapy Supplies (FLUTTER) DEVI Blow through 4 times per set, 4 sets daily 1 each 0   Current Facility-Administered Medications on File Prior to Visit  Medication Dose Route Frequency Provider Last Rate Last Dose  . levalbuterol (XOPENEX) nebulizer solution 0.63 mg  0.63 mg Nebulization  Once Deneise Lever, MD        Allergies  Allergen Reactions  . Losartan     Angioedema   . Metformin And Related Diarrhea  . Guaifenesin Other (See Comments)    MUCINEX REACTION: asthma attacks  . Morphine And Related Other (See Comments)    Pt. States med makes her crazy  . Oxycodone-Acetaminophen  Itching    TYLOX. Caused internal itching per patient   . Guaifenesin Er Other (See Comments)    MUCINEX REACTION: asthma attacks    Family History  Problem Relation Age of Onset  . Lung cancer Father     DIED AGE 15 LUNG CA  . Alcohol abuse Father   . Anxiety disorder Father   . Depression Father   . COPD Mother     DIED AGE 60,MRSA,COPD,PNEUMONIA  . Pneumonia Mother     DIED AGE 60,MRSA,COPD,PNEUMONIA  . Supraventricular tachycardia Mother   . Anxiety disorder Mother   . Depression Mother   . Alcohol abuse Mother   . COPD Brother     AGE 56  . Heart disease Sister     DIED AGE 57, SMOKER,?HEART  . Colon cancer Neg Hx     BP 132/86 mmHg  Pulse 107  Temp(Src) 97.9 F (36.6 C) (Oral)  Ht 5' 4.5" (1.638 m)  Wt 197 lb (89.359 kg)  BMI 33.31 kg/m2  SpO2 92%   Review of Systems She denies n/v/d, hypoglycemia, and numbness.      Objective:   Physical Exam VITAL SIGNS:  See vs page GENERAL: no distress Pulses: dorsalis pedis intact bilat.   Feet: no deformity.  no edema Skin:  no ulcer on the feet.  normal color and temp. Neuro: sensation is intact to touch on the feet.   Lab Results  Component Value Date   HGBA1C 7.0* 01/22/2014      Assessment & Plan:  Asthma: this is causing temporary severe hyperglycemia.   DM: well-controlled, except for the above. Noncompliance with cbg recording: I'll work around this as best I can.  Patient is advised the following: Patient Instructions  check your blood sugar 2 times a day.  vary the time of day when you check, between before the 3 meals, and at bedtime.  also check if you have symptoms of your blood sugar  being too high or too low.  please keep a record of the readings and bring it to your next appointment here.  please call us sooner if your blood sugar goes below 70, or if you have a lot of readings over 200.  Please feel free to make comments on the record. Please continue "70/30" insulin, 90 units with breakfast, and 40 units with the evening meal.   For during and 1 week after any steroids (shot or pills), take extra insulin:  If in the 200's, 10 extra.  If in the 300's, take 20 extra.  if over 400, please call us.  On this type of insulin schedule, you should eat meals on a regular schedule.  If a meal is missed or significantly delayed, your blood sugar could go low.  Please come back for a follow-up appointment in 3 months.

## 2014-02-05 ENCOUNTER — Other Ambulatory Visit: Payer: Self-pay | Admitting: Endocrinology

## 2014-02-05 NOTE — Telephone Encounter (Signed)
Please advise if ok to refill med is listed under historical provider. Thanks!

## 2014-02-06 ENCOUNTER — Inpatient Hospital Stay: Payer: Medicare Other | Admitting: Internal Medicine

## 2014-02-07 ENCOUNTER — Encounter (HOSPITAL_COMMUNITY): Payer: Self-pay | Admitting: Psychiatry

## 2014-02-07 ENCOUNTER — Ambulatory Visit (INDEPENDENT_AMBULATORY_CARE_PROVIDER_SITE_OTHER): Payer: Medicare Other | Admitting: Psychiatry

## 2014-02-07 VITALS — BP 155/121 | HR 101 | Ht 64.5 in | Wt 196.0 lb

## 2014-02-07 DIAGNOSIS — F411 Generalized anxiety disorder: Secondary | ICD-10-CM

## 2014-02-07 DIAGNOSIS — F4310 Post-traumatic stress disorder, unspecified: Secondary | ICD-10-CM

## 2014-02-07 MED ORDER — SERTRALINE HCL 100 MG PO TABS: ORAL_TABLET | ORAL | Status: DC

## 2014-02-07 MED ORDER — DULOXETINE HCL 60 MG PO CPEP: 60.0000 mg | ORAL_CAPSULE | Freq: Every day | ORAL | Status: DC
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
## 2014-02-08 ENCOUNTER — Other Ambulatory Visit: Payer: Self-pay | Admitting: Internal Medicine

## 2014-02-09 ENCOUNTER — Other Ambulatory Visit: Payer: Self-pay | Admitting: Endocrinology

## 2014-02-09 NOTE — Telephone Encounter (Signed)
Pt needs refill on the levothyroxin she takes 75 mcg daily in the AM please call into [facility] thank you

## 2014-02-09 NOTE — Telephone Encounter (Signed)
Please advise refill? Rx levothyroxine is listed as historical provider.

## 2014-02-12 ENCOUNTER — Ambulatory Visit (INDEPENDENT_AMBULATORY_CARE_PROVIDER_SITE_OTHER): Payer: Medicare Other | Admitting: Internal Medicine

## 2014-02-12 ENCOUNTER — Encounter: Payer: Self-pay | Admitting: Internal Medicine

## 2014-02-12 ENCOUNTER — Other Ambulatory Visit: Payer: Self-pay | Admitting: Endocrinology

## 2014-02-12 ENCOUNTER — Other Ambulatory Visit (INDEPENDENT_AMBULATORY_CARE_PROVIDER_SITE_OTHER): Payer: Medicare Other

## 2014-02-12 VITALS — BP 130/88 | HR 94 | Temp 98.5°F | Ht 64.5 in | Wt 197.0 lb

## 2014-02-12 DIAGNOSIS — M48061 Spinal stenosis, lumbar region without neurogenic claudication: Secondary | ICD-10-CM

## 2014-02-12 DIAGNOSIS — E119 Type 2 diabetes mellitus without complications: Secondary | ICD-10-CM

## 2014-02-12 DIAGNOSIS — I10 Essential (primary) hypertension: Secondary | ICD-10-CM

## 2014-02-12 DIAGNOSIS — Z23 Encounter for immunization: Secondary | ICD-10-CM

## 2014-02-12 DIAGNOSIS — J202 Acute bronchitis due to streptococcus: Secondary | ICD-10-CM

## 2014-02-12 DIAGNOSIS — M180 Bilateral primary osteoarthritis of first carpometacarpal joints: Secondary | ICD-10-CM

## 2014-02-12 DIAGNOSIS — E038 Other specified hypothyroidism: Secondary | ICD-10-CM

## 2014-02-12 DIAGNOSIS — M4806 Spinal stenosis, lumbar region: Secondary | ICD-10-CM

## 2014-02-12 LAB — BASIC METABOLIC PANEL
BUN: 15 mg/dL (ref 6–23)
CO2: 26 mEq/L (ref 19–32)
Calcium: 8.9 mg/dL (ref 8.4–10.5)
Chloride: 101 mEq/L (ref 96–112)
Creatinine, Ser: 0.7 mg/dL (ref 0.4–1.2)
GFR: 93.8 mL/min (ref >60.00)
Glucose, Bld: 158 mg/dL — ABNORMAL HIGH (ref 70–99)
Potassium: 4 mEq/L (ref 3.5–5.1)
Sodium: 136 mEq/L (ref 135–145)

## 2014-02-12 LAB — LIPID PANEL
Cholesterol: 198 mg/dL (ref 0–200)
HDL: 68.2 mg/dL (ref >39.00)
LDL Cholesterol: 94 mg/dL (ref 0–99)
NonHDL: 129.8
Total CHOL/HDL Ratio: 3
Triglycerides: 180 mg/dL — ABNORMAL HIGH (ref 0.0–149.0)
VLDL: 36 mg/dL (ref 0.0–40.0)

## 2014-02-12 MED ORDER — HYDROCODONE-ACETAMINOPHEN 5-325 MG PO TABS: 1.0000 | ORAL_TABLET | Freq: Four times a day (QID) | ORAL | Status: DC | PRN

## 2014-02-12 MED ORDER — LEVOTHYROXINE SODIUM 75 MCG PO TABS: 75.0000 ug | ORAL_TABLET | Freq: Every day | ORAL | Status: DC

## 2014-02-12 NOTE — Progress Notes (Signed)
Subjective:    Patient ID: [name], female    DOB: [birth year], 63 y.o.   MRN: [id number]  Cough This is a recurrent problem. The current episode started 1 to 4 weeks ago. The problem has been unchanged. The problem occurs every few hours. The cough is productive of purulent sputum. Pertinent negatives include no chest pain, chills, ear congestion, ear pain, fever, headaches, heartburn, hemoptysis, myalgias, nasal congestion, postnasal drip, rash, rhinorrhea, sore throat, shortness of breath, sweats, weight loss or wheezing. Nothing aggravates the symptoms. She has tried OTC cough suppressant for the symptoms. The treatment provided moderate relief.      Review of Systems  Constitutional: Negative.  Negative for fever, chills, weight loss, diaphoresis, appetite change and fatigue.  HENT: Negative.  Negative for congestion, ear pain, postnasal drip, rhinorrhea, sinus pressure, sore throat, trouble swallowing and voice change.   Eyes: Negative.   Respiratory: Positive for cough. Negative for apnea, hemoptysis, choking, chest tightness, shortness of breath and wheezing.   Cardiovascular: Negative.  Negative for chest pain, palpitations and leg swelling.  Gastrointestinal: Negative.  Negative for heartburn, nausea, vomiting, abdominal pain, diarrhea, constipation and blood in stool.  Endocrine: Negative.  Negative for polydipsia, polyphagia and polyuria.  Genitourinary: Negative.   Musculoskeletal: Positive for back pain and arthralgias. Negative for myalgias, joint swelling, gait problem, neck pain and neck stiffness.  Skin: Negative.  Negative for rash.  Allergic/Immunologic: Negative.   Neurological: Negative.  Negative for headaches.  Hematological: Negative.  Negative for adenopathy. Does not bruise/bleed easily.  Psychiatric/Behavioral: Negative.        Objective:   Physical Exam  Constitutional: She is oriented to person, place, and time. She appears well-developed and  well-nourished.  Non-toxic appearance. She does not have a sickly appearance. She does not appear ill. No distress.  HENT:  Head: Normocephalic and atraumatic.  Mouth/Throat: Oropharynx is clear and moist. No oropharyngeal exudate.  Eyes: Conjunctivae are normal. Right eye exhibits no discharge. Left eye exhibits no discharge. No scleral icterus.  Neck: Normal range of motion. Neck supple. No JVD present. No tracheal deviation present. No thyromegaly present.  Cardiovascular: Normal rate, regular rhythm, normal heart sounds and intact distal pulses.  Exam reveals no gallop and no friction rub.   No murmur heard. Pulmonary/Chest: Effort normal and breath sounds normal. No stridor. No tachypnea. No respiratory distress. She has no decreased breath sounds. She has no wheezes. She has no rhonchi. She has no rales. She exhibits no tenderness.  Abdominal: Soft. Bowel sounds are normal. She exhibits no distension and no mass. There is no tenderness. There is no rebound and no guarding.  Musculoskeletal: Normal range of motion. She exhibits no edema or tenderness.  Lymphadenopathy:    She has no cervical adenopathy.  Neurological: She is oriented to person, place, and time.  Skin: Skin is warm and dry. No rash noted. She is not diaphoretic. No erythema. No pallor.  Vitals reviewed.     Lab Results  Component Value Date   WBC 5.7 01/30/2014   HGB 12.7 01/30/2014   HCT 38.4 01/30/2014   PLT 203 01/30/2014   GLUCOSE 428* 01/30/2014   CHOL 187 01/18/2013   TRIG 96.0 01/18/2013   HDL 79.70 01/18/2013   LDLCALC 88 01/18/2013   ALT 27 01/30/2014   AST 24 01/30/2014   NA 128* 01/30/2014   K 4.7 01/30/2014   CL 91* 01/30/2014   CREATININE 0.72 01/30/2014   BUN 20 01/30/2014   CO2 23  01/30/2014   TSH 1.690 01/22/2014   INR 1.0 02/27/2008   HGBA1C 7.0* 01/22/2014   MICROALBUR 0.7 10/27/2013      Assessment & Plan:

## 2014-02-12 NOTE — Patient Instructions (Signed)
Acute Bronchitis °Bronchitis is inflammation of the airways that extend from the windpipe into the lungs (bronchi). The inflammation often causes mucus to develop. This leads to a cough, which is the most common symptom of bronchitis.  °In acute bronchitis, the condition usually develops suddenly and goes away over time, usually in a couple weeks. Smoking, allergies, and asthma can make bronchitis worse. Repeated episodes of bronchitis may cause further lung problems.  °CAUSES °Acute bronchitis is most often caused by the same virus that causes a cold. The virus can spread from person to person (contagious) through coughing, sneezing, and touching contaminated objects. °SIGNS AND SYMPTOMS  °· Cough.   °· Fever.   °· Coughing up mucus.   °· Body aches.   °· Chest congestion.   °· Chills.   °· Shortness of breath.   °· Sore throat.   °DIAGNOSIS  °Acute bronchitis is usually diagnosed through a physical exam. Your health care Kambrea Carrasco will also ask you questions about your medical history. Tests, such as chest X-rays, are sometimes done to rule out other conditions.  °TREATMENT  °Acute bronchitis usually goes away in a couple weeks. Oftentimes, no medical treatment is necessary. Medicines are sometimes given for relief of fever or cough. Antibiotic medicines are usually not needed but may be prescribed in certain situations. In some cases, an inhaler may be recommended to help reduce shortness of breath and control the cough. A cool mist vaporizer may also be used to help thin bronchial secretions and make it easier to clear the chest.  °HOME CARE INSTRUCTIONS °· Get plenty of rest.   °· Drink enough fluids to keep your urine clear or pale yellow (unless you have a medical condition that requires fluid restriction). Increasing fluids may help thin your respiratory secretions (sputum) and reduce chest congestion, and it will prevent dehydration.   °· Take medicines only as directed by your health care Piercen Covino. °· If  you were prescribed an antibiotic medicine, finish it all even if you start to feel better. °· Avoid smoking and secondhand smoke. Exposure to cigarette smoke or irritating chemicals will make bronchitis worse. If you are a smoker, consider using nicotine gum or skin patches to help control withdrawal symptoms. Quitting smoking will help your lungs heal faster.   °· Reduce the chances of another bout of acute bronchitis by washing your hands frequently, avoiding people with cold symptoms, and trying not to touch your hands to your mouth, nose, or eyes.   °· Keep all follow-up visits as directed by your health care Derwin Reddy.   °SEEK MEDICAL CARE IF: °Your symptoms do not improve after 1 week of treatment.  °SEEK IMMEDIATE MEDICAL CARE IF: °· You develop an increased fever or chills.   °· You have chest pain.   °· You have severe shortness of breath. °· You have bloody sputum.   °· You develop dehydration. °· You faint or repeatedly feel like you are going to pass out. °· You develop repeated vomiting. °· You develop a severe headache. °MAKE SURE YOU:  °· Understand these instructions. °· Will watch your condition. °· Will get help right away if you are not doing well or get worse. °Document Released: 04/30/2004 Document Revised: 08/07/2013 Document Reviewed: 09/13/2012 °Exit[employer]® Patient Information ©2015 [employer]. This information is not intended to replace advice given to you by your health care Perkins Molina. Make sure you discuss any questions you have with your health care Tesla Bochicchio. ° °

## 2014-02-12 NOTE — Progress Notes (Signed)
Pre visit review using our clinic review tool, if applicable. No additional management support is needed unless otherwise documented below in the visit note. 

## 2014-02-13 ENCOUNTER — Telehealth: Payer: Self-pay | Admitting: Internal Medicine

## 2014-02-13 ENCOUNTER — Ambulatory Visit: Payer: Self-pay | Admitting: Endocrinology

## 2014-02-13 MED ORDER — AZITHROMYCIN 250 MG PO TABS: 250.0000 mg | ORAL_TABLET | ORAL | Status: DC

## 2014-02-13 MED ORDER — CEFUROXIME AXETIL 500 MG PO TABS: 500.0000 mg | ORAL_TABLET | Freq: Two times a day (BID) | ORAL | Status: DC

## 2014-02-13 NOTE — Telephone Encounter (Signed)
Rx was sent to pharm  Pt aware  Nothing further needed  She will call for appt if not improving

## 2014-02-13 NOTE — Telephone Encounter (Signed)
Called pt. C/o prod cough-thick yellow phlem and chest tx from coughing. She is taking promethazine w/ codeine cough syrup for cough. Please advise Dr. Annamaria Boots thanks  Allergies  Allergen Reactions  . Losartan     Angioedema   . Metformin And Related Diarrhea  . Guaifenesin Other (See Comments)    MUCINEX REACTION: asthma attacks  . Morphine And Related Other (See Comments)    Pt. States med makes her crazy  . Oxycodone-Acetaminophen Itching    TYLOX. Caused internal itching per patient   . Guaifenesin Er Other (See Comments)    MUCINEX REACTION: asthma attacks     Current Outpatient Prescriptions on File Prior to Visit  Medication Sig Dispense Refill  . albuterol (PROAIR HFA) 108 (90 BASE) MCG/ACT inhaler Inhale 2 puffs into the lungs every 6 (six) hours as needed for wheezing or shortness of breath. 8.5 g 5  . albuterol (PROVENTIL) (2.5 MG/3ML) 0.083% nebulizer solution Take 3 mLs (2.5 mg total) by nebulization every 6 (six) hours as needed for wheezing or shortness of breath. 360 mL 3  . amLODipine (NORVASC) 10 MG tablet Take 1 tablet (10 mg total) by mouth daily. 30 tablet 0  . aspirin EC 81 MG tablet Take 81 mg by mouth every morning.     . cefUROXime (CEFTIN) 500 MG tablet Take 1 tablet (500 mg total) by mouth 2 (two) times daily. 20 tablet 0  . DULoxetine (CYMBALTA) 60 MG capsule Take 1 capsule (60 mg total) by mouth daily. 30 capsule 2  . gabapentin (NEURONTIN) 100 MG capsule Take 300 mg by mouth at bedtime.    Marland Kitchen HYDROcodone-acetaminophen (NORCO/VICODIN) 5-325 MG per tablet Take 1-2 tablets by mouth every 6 (six) hours as needed for moderate pain. 100 tablet 0  . insulin NPH-regular Human (NOVOLIN 70/30) (70-30) 100 UNIT/ML injection 90 units with breakfast and 40 units with evening meal and syringes 2/day.    . levothyroxine (SYNTHROID, LEVOTHROID) 75 MCG tablet Take 1 tablet (75 mcg total) by mouth daily before breakfast. 90 tablet 1  . metoprolol succinate (TOPROL-XL) 50 MG 24  hr tablet Take 1 tablet (50 mg total) by mouth 2 (two) times daily. Take with or immediately following a meal. 60 tablet 11  . NOVOLIN 70/30 RELION (70-30) 100 UNIT/ML injection INJECT 90  UNITS WITH BREAKFAST SUBCUTANEOUSLY AND 20 UNITS  WITH THE EVENING MEAL 40 mL 2  . primidone (MYSOLINE) 50 MG tablet TAKE THREE TABLETS BY MOUTH TWICE DAILY 180 tablet 3  . ranitidine (ZANTAC) 150 MG tablet Take 150 mg by mouth 2 (two) times daily.     Marland Kitchen Respiratory Therapy Supplies (FLUTTER) DEVI Blow through 4 times per set, 4 sets daily 1 each 0  . sertraline (ZOLOFT) 100 MG tablet Take one and one half tablets daily 45 tablet 2   Current Facility-Administered Medications on File Prior to Visit  Medication Dose Route Frequency Provider Last Rate Last Dose  . levalbuterol (XOPENEX) nebulizer solution 0.63 mg  0.63 mg Nebulization Once Deneise Lever, MD

## 2014-02-13 NOTE — Assessment & Plan Note (Signed)
Her BP is well controlled 

## 2014-02-13 NOTE — Telephone Encounter (Signed)
Offer Zpak 

## 2014-02-13 NOTE — Assessment & Plan Note (Signed)
I will treat the infection with ceftin 

## 2014-02-13 NOTE — Assessment & Plan Note (Signed)
Her blood sugars are well controlled I have asked her to have an eye exam done

## 2014-02-13 NOTE — Assessment & Plan Note (Signed)
She will continue norco as needed for pain 

## 2014-02-15 ENCOUNTER — Telehealth: Payer: Self-pay | Admitting: Internal Medicine

## 2014-02-15 ENCOUNTER — Inpatient Hospital Stay: Admit: 2014-02-15 | Payer: MEDICARE | Attending: Internal Medicine | Primary: Internal Medicine

## 2014-02-15 ENCOUNTER — Encounter

## 2014-02-15 DIAGNOSIS — Z720 Tobacco use: Secondary | ICD-10-CM

## 2014-02-15 MED ORDER — AMOXICILLIN-POT CLAVULANATE 875-125 MG PO TABS: 1.0000 | ORAL_TABLET | Freq: Two times a day (BID) | ORAL | Status: DC

## 2014-02-15 NOTE — Telephone Encounter (Signed)
If this feels to her like her asthma, then we may need to find a way to see her.  If a different antibiotic makes sense to her, then offer augmentin 875, # 14, 1 twice daily Suggest also Mucinex to thin mucus some.

## 2014-02-15 NOTE — Telephone Encounter (Signed)
PT states that she has 2 days left of Zpak and still no better.  C/o pain in right chest that goes around to back, worse with cough or laugh.  Non prod cough worse at night, chest congestion, rattling in chest.  Denies fever or sob.  Taking Phenergan with Codeine syrup and using flutter valve.  Please advise.  Allergies  Allergen Reactions  . Losartan     Angioedema   . Metformin And Related Diarrhea  . Guaifenesin Other (See Comments)    MUCINEX REACTION: asthma attacks  . Morphine And Related Other (See Comments)    Pt. States med makes her crazy  . Oxycodone-Acetaminophen Itching    TYLOX. Caused internal itching per patient   . Guaifenesin Er Other (See Comments)    MUCINEX REACTION: asthma attacks    Current Outpatient Prescriptions on File Prior to Visit  Medication Sig Dispense Refill  . albuterol (PROAIR HFA) 108 (90 BASE) MCG/ACT inhaler Inhale 2 puffs into the lungs every 6 (six) hours as needed for wheezing or shortness of breath. 8.5 g 5  . albuterol (PROVENTIL) (2.5 MG/3ML) 0.083% nebulizer solution Take 3 mLs (2.5 mg total) by nebulization every 6 (six) hours as needed for wheezing or shortness of breath. 360 mL 3  . amLODipine (NORVASC) 10 MG tablet Take 1 tablet (10 mg total) by mouth daily. 30 tablet 0  . aspirin EC 81 MG tablet Take 81 mg by mouth every morning.     Marland Kitchen azithromycin (ZITHROMAX) 250 MG tablet Take 1 tablet (250 mg total) by mouth as directed. 6 tablet 0  . DULoxetine (CYMBALTA) 60 MG capsule Take 1 capsule (60 mg total) by mouth daily. 30 capsule 2  . gabapentin (NEURONTIN) 100 MG capsule Take 300 mg by mouth at bedtime.    Marland Kitchen HYDROcodone-acetaminophen (NORCO/VICODIN) 5-325 MG per tablet Take 1-2 tablets by mouth every 6 (six) hours as needed for moderate pain. 100 tablet 0  . insulin NPH-regular Human (NOVOLIN 70/30) (70-30) 100 UNIT/ML injection 90 units with breakfast and 40 units with evening meal and syringes 2/day.    . levothyroxine (SYNTHROID,  LEVOTHROID) 75 MCG tablet Take 1 tablet (75 mcg total) by mouth daily before breakfast. 90 tablet 1  . metoprolol succinate (TOPROL-XL) 50 MG 24 hr tablet Take 1 tablet (50 mg total) by mouth 2 (two) times daily. Take with or immediately following a meal. 60 tablet 11  . NOVOLIN 70/30 RELION (70-30) 100 UNIT/ML injection INJECT 90  UNITS WITH BREAKFAST SUBCUTANEOUSLY AND 20 UNITS  WITH THE EVENING MEAL 40 mL 2  . primidone (MYSOLINE) 50 MG tablet TAKE THREE TABLETS BY MOUTH TWICE DAILY 180 tablet 3  . ranitidine (ZANTAC) 150 MG tablet Take 150 mg by mouth 2 (two) times daily.     Marland Kitchen Respiratory Therapy Supplies (FLUTTER) DEVI Blow through 4 times per set, 4 sets daily 1 each 0  . sertraline (ZOLOFT) 100 MG tablet Take one and one half tablets daily 45 tablet 2   Current Facility-Administered Medications on File Prior to Visit  Medication Dose Route Frequency Provider Last Rate Last Dose  . levalbuterol (XOPENEX) nebulizer solution 0.63 mg  0.63 mg Nebulization Once Deneise Lever, MD

## 2014-02-15 NOTE — Telephone Encounter (Signed)
Called spoke with pt. Aware of recs. She wants to try the ABX and see if it helps. I have sent this in. Nothing further needed

## 2014-02-19 ENCOUNTER — Encounter (HOSPITAL_COMMUNITY): Payer: Self-pay

## 2014-02-19 ENCOUNTER — Emergency Department (HOSPITAL_COMMUNITY)
Admission: EM | Admit: 2014-02-19 | Discharge: 2014-02-19 | Disposition: A | Discharge status: Home / Self Care | Payer: Medicare Other | Attending: Emergency Medicine | Admitting: Emergency Medicine

## 2014-02-19 ENCOUNTER — Emergency Department (HOSPITAL_COMMUNITY): Payer: Medicare Other

## 2014-02-19 DIAGNOSIS — E039 Hypothyroidism, unspecified: Secondary | ICD-10-CM | POA: Diagnosis not present

## 2014-02-19 DIAGNOSIS — Z792 Long term (current) use of antibiotics: Secondary | ICD-10-CM | POA: Insufficient documentation

## 2014-02-19 DIAGNOSIS — K219 Gastro-esophageal reflux disease without esophagitis: Secondary | ICD-10-CM | POA: Insufficient documentation

## 2014-02-19 DIAGNOSIS — G8929 Other chronic pain: Secondary | ICD-10-CM | POA: Insufficient documentation

## 2014-02-19 DIAGNOSIS — R05 Cough: Secondary | ICD-10-CM

## 2014-02-19 DIAGNOSIS — Z87891 Personal history of nicotine dependence: Secondary | ICD-10-CM | POA: Insufficient documentation

## 2014-02-19 DIAGNOSIS — F419 Anxiety disorder, unspecified: Secondary | ICD-10-CM | POA: Diagnosis not present

## 2014-02-19 DIAGNOSIS — Z7982 Long term (current) use of aspirin: Secondary | ICD-10-CM | POA: Insufficient documentation

## 2014-02-19 DIAGNOSIS — Z794 Long term (current) use of insulin: Secondary | ICD-10-CM | POA: Insufficient documentation

## 2014-02-19 DIAGNOSIS — E119 Type 2 diabetes mellitus without complications: Secondary | ICD-10-CM | POA: Insufficient documentation

## 2014-02-19 DIAGNOSIS — J45909 Unspecified asthma, uncomplicated: Secondary | ICD-10-CM | POA: Insufficient documentation

## 2014-02-19 DIAGNOSIS — M5416 Radiculopathy, lumbar region: Secondary | ICD-10-CM | POA: Insufficient documentation

## 2014-02-19 DIAGNOSIS — R0789 Other chest pain: Secondary | ICD-10-CM

## 2014-02-19 DIAGNOSIS — F329 Major depressive disorder, single episode, unspecified: Secondary | ICD-10-CM | POA: Insufficient documentation

## 2014-02-19 DIAGNOSIS — G40909 Epilepsy, unspecified, not intractable, without status epilepticus: Secondary | ICD-10-CM | POA: Diagnosis not present

## 2014-02-19 DIAGNOSIS — R059 Cough, unspecified: Secondary | ICD-10-CM

## 2014-02-19 DIAGNOSIS — R079 Chest pain, unspecified: Secondary | ICD-10-CM

## 2014-02-19 DIAGNOSIS — M5136 Other intervertebral disc degeneration, lumbar region: Secondary | ICD-10-CM | POA: Diagnosis not present

## 2014-02-19 DIAGNOSIS — Z79899 Other long term (current) drug therapy: Secondary | ICD-10-CM | POA: Insufficient documentation

## 2014-02-19 DIAGNOSIS — I10 Essential (primary) hypertension: Secondary | ICD-10-CM | POA: Insufficient documentation

## 2014-02-19 DIAGNOSIS — R251 Tremor, unspecified: Secondary | ICD-10-CM | POA: Insufficient documentation

## 2014-02-19 LAB — I-STAT TROPONIN, ED: Troponin i, poc: 0 ng/mL (ref 0.00–0.08)

## 2014-02-19 LAB — BASIC METABOLIC PANEL
Anion gap: 15 (ref 5–15)
BUN: 16 mg/dL (ref 6–23)
CO2: 23 mEq/L (ref 19–32)
Calcium: 9.2 mg/dL (ref 8.4–10.5)
Chloride: 97 mEq/L (ref 96–112)
Creatinine, Ser: 0.91 mg/dL (ref 0.50–1.10)
GFR calc Af Amer: 75 mL/min — ABNORMAL LOW (ref >90)
GFR calc non Af Amer: 65 mL/min — ABNORMAL LOW (ref >90)
Glucose, Bld: 338 mg/dL — ABNORMAL HIGH (ref 70–99)
Potassium: 4.4 mEq/L (ref 3.7–5.3)
Sodium: 135 mEq/L — ABNORMAL LOW (ref 137–147)

## 2014-02-19 LAB — CBC
HCT: 38.9 % (ref 36.0–46.0)
Hemoglobin: 13.5 g/dL (ref 12.0–15.0)
MCH: 30.1 pg (ref 26.0–34.0)
MCHC: 34.7 g/dL (ref 30.0–36.0)
MCV: 86.8 fL (ref 78.0–100.0)
Platelets: 209 10*3/uL (ref 150–400)
RBC: 4.48 MIL/uL (ref 3.87–5.11)
RDW: 12.4 % (ref 11.5–15.5)
WBC: 9.7 10*3/uL (ref 4.0–10.5)

## 2014-02-19 MED ORDER — HYDROCODONE-ACETAMINOPHEN 5-325 MG PO TABS
2.0000 | ORAL_TABLET | Freq: Once | ORAL | Status: AC
  Administered 2014-02-19: 2 via ORAL
  Filled 2014-02-19: qty 2

## 2014-02-19 MED ORDER — NAPROXEN 500 MG PO TABS: 500.0000 mg | ORAL_TABLET | Freq: Two times a day (BID) | ORAL | Status: DC

## 2014-02-19 NOTE — ED Provider Notes (Signed)
CSN: [id number]     Arrival date & time 02/19/14  1436 History   First MD Initiated Contact with Patient 02/19/14 1842     Chief Complaint  Patient presents with  . Chest Pain  . Shortness of Breath     (Consider location/radiation/quality/duration/timing/severity/associated sxs/prior Treatment) HPI [name] is a 63 y.o. female with history of hypertension, diabetes, asthma who comes in for evaluation of cough and right-sided chest pain. Patient has had cough for the past 2 weeks. She was evaluated by the emergency Department in any pain in and placed on an antibiotic. Chest x-ray at that time showed atelectasis in the right lower lobe. She also reports having seen her pulmonologist since then and changed her antibiotic to Augmentin. She is taking Augmentin for one week and has one week remaining. She is concerned today that she has a lingering cough and chest pain that is exacerbated when she coughs. She characterizes the pain as a sharpness and localizes the pain directly under her right breast. She has Vicodin at home which she says helps with the chest pain. She has not tried any other medications. She denies any fevers, shortness of breath, abdominal pain, n/v/c/d at this time  Past Medical History  Diagnosis Date  . 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 [name], residual palpitations  . Vocal cord dysfunction   . Tremor, essential     takes Mysoline and Neurontin daily.  . Allergic rhinitis     uses Nasonex daily as needed  . Low sodium syndrome   . Chronic back pain     reason unknown  . Lumbar radiculopathy   . DDD (degenerative disc disease), lumbar   . Esophageal reflux     takes Zantac daily  . Hypertension     takes Losartan daily  . Anxiety     takes Ativan daily as needed  . Hypothyroidism     takes Synthroid daily  . Depression     takes Zoloft daily as well as Cymbalta  . Diabetes mellitus     takes NOvolin daily   . Asthma     Albuterol inhaler and Neb daily as needed  . History of bronchitis Dec 2014  . Seizures 04-05-13    one time ran out of Primidone and had stopped it cold [place]  . Weakness     in left arm  . Joint pain   . Joint swelling     left ankle  . Dysphagia   . Constipation   . Allergy to angiotensin receptor blockers (ARB) 01/22/2014    Angioedema   Past Surgical History  Procedure Laterality Date  . Tonsillectomy    . Right breast cyst      benign  . Left ankle ligament repair      x 2  . Left elbow repair    . Partial hysterectomy    . Cholecystectomy    . Cesarean section    . Ablation for avnrt    . Colonoscopy  01/16/2004    BVQ:XIHWTU rectum/colon  . Esophagogastroduodenoscopy (egd) with esophageal dilation  04/03/2002    UEK:[id number] ring, otherwise normal esophagus, status post dilation with 56 F/Normal stomach  . Bartholin gland cyst excision      x 2  . Esophagogastroduodenoscopy (egd) with esophageal dilation N/A 11/08/2012    Procedure: ESOPHAGOGASTRODUODENOSCOPY (EGD) WITH ESOPHAGEAL DILATION;  Surgeon: Daneil Dolin, MD;  Location: [facility];  Service:  Endoscopy;  Laterality: N/A;  11:30  . Subthalamic stimulator insertion Bilateral 12/04/2013    Procedure: Bilateral Deep brain stimulator placement;  Surgeon: [name], MD;  Location: [place] [facility];  Service: Neurosurgery;  Laterality: Bilateral;  Bilateral Deep brain stimulator placement  . Pulse generator implant Bilateral 12/15/2013    Procedure: BILATERAL PULSE GENERATOR IMPLANT;  Surgeon: [name], MD;  Location: [place] [facility];  Service: Neurosurgery;  Laterality: Bilateral;  BILATERAL   Family History  Problem Relation Age of Onset  . Lung cancer Father     DIED AGE 23 LUNG CA  . Alcohol abuse Father   . Anxiety disorder Father   . Depression Father   . COPD Mother     DIED AGE 14,MRSA,COPD,PNEUMONIA  . Pneumonia Mother     DIED AGE 14,MRSA,COPD,PNEUMONIA  . Supraventricular  tachycardia Mother   . Anxiety disorder Mother   . Depression Mother   . Alcohol abuse Mother   . COPD Brother     AGE 70  . Heart disease Sister     DIED AGE 67, SMOKER,?HEART  . Colon cancer Neg Hx    History  Substance Use Topics  . Smoking status: Former Smoker -- 0.30 packs/day for 10 years    Types: Cigarettes  . Smokeless tobacco: Never Used     Comment: quit smoking 25+yrs ago  . Alcohol Use: No     Comment: no alcohol in 60yrs   OB History    No data available     Review of Systems  Constitutional: Negative for fever.  HENT: Negative for sore throat.   Eyes: Negative for visual disturbance.  Respiratory: Positive for cough. Negative for shortness of breath.   Cardiovascular: Negative for chest pain.  Gastrointestinal: Negative for abdominal pain.  Endocrine: Negative for polyuria.  Genitourinary: Negative for dysuria.  Musculoskeletal:       Chest pain  Skin: Negative for rash.  Neurological: Negative for headaches.      Allergies  Losartan; Metformin and related; Guaifenesin; Morphine and related; Oxycodone-acetaminophen; and Guaifenesin er  Home Medications   Prior to Admission medications   Medication Sig Start Date End Date Taking? Authorizing Provider  albuterol (PROAIR HFA) 108 (90 BASE) MCG/ACT inhaler Inhale 2 puffs into the lungs every 6 (six) hours as needed for wheezing or shortness of breath. 08/03/13  Yes Tammy S Parrett, NP  albuterol (PROVENTIL) (2.5 MG/3ML) 0.083% nebulizer solution Take 3 mLs (2.5 mg total) by nebulization every 6 (six) hours as needed for wheezing or shortness of breath. 02/01/14  Yes Deneise Lever, MD  amLODipine (NORVASC) 10 MG tablet Take 1 tablet (10 mg total) by mouth daily. 01/22/14  Yes Lezlie Octave Black, NP  amoxicillin-clavulanate (AUGMENTIN) 875-125 MG per tablet Take 1 tablet by mouth 2 (two) times daily. 02/15/14  Yes Deneise Lever, MD  aspirin EC 81 MG tablet Take 81 mg by mouth every morning.    Yes Historical  Provider, MD  DULoxetine (CYMBALTA) 60 MG capsule Take 1 capsule (60 mg total) by mouth daily. 02/07/14 02/07/15 Yes Levonne Spiller, MD  gabapentin (NEURONTIN) 100 MG capsule Take 300 mg by mouth at bedtime.   Yes Historical Provider, MD  HYDROcodone-acetaminophen (NORCO/VICODIN) 5-325 MG per tablet Take 1-2 tablets by mouth every 6 (six) hours as needed for moderate pain. 02/12/14  Yes Janith Lima, MD  insulin NPH-regular Human (NOVOLIN 70/30) (70-30) 100 UNIT/ML injection 90 units with breakfast and 40 units with evening meal and syringes 2/day.  10/13/13  Yes Renato Shin, MD  levothyroxine (SYNTHROID, LEVOTHROID) 75 MCG tablet Take 1 tablet (75 mcg total) by mouth daily before breakfast. 02/12/14  Yes Janith Lima, MD  metoprolol succinate (TOPROL-XL) 50 MG 24 hr tablet Take 1 tablet (50 mg total) by mouth 2 (two) times daily. Take with or immediately following a meal. 05/04/13  Yes Janith Lima, MD  NOVOLIN 70/30 RELION (70-30) 100 UNIT/ML injection INJECT 90  UNITS WITH BREAKFAST SUBCUTANEOUSLY AND 20 UNITS  WITH THE EVENING MEAL 02/12/14  Yes Renato Shin, MD  primidone (MYSOLINE) 50 MG tablet TAKE THREE TABLETS BY MOUTH TWICE DAILY 02/08/14  Yes Janith Lima, MD  ranitidine (ZANTAC) 150 MG tablet Take 150 mg by mouth 2 (two) times daily.    Yes Historical Provider, MD  sertraline (ZOLOFT) 100 MG tablet Take one and one half tablets daily Patient taking differently: Take 150 mg by mouth daily.  02/07/14  Yes Levonne Spiller, MD  azithromycin (ZITHROMAX) 250 MG tablet Take 1 tablet (250 mg total) by mouth as directed. 02/13/14   Deneise Lever, MD   BP 148/100 mmHg  Pulse 109  Temp(Src) 97.8 F (36.6 C) (Oral)  Resp 24  Ht 5' 5.5" (1.664 m)  Wt 197 lb (89.359 kg)  BMI 32.27 kg/m2  SpO2 95% Physical Exam  Constitutional: She is oriented to person, place, and time. She appears well-developed and well-nourished.  HENT:  Head: Normocephalic and atraumatic.  Mouth/Throat: Oropharynx is clear  and moist.  Eyes: Conjunctivae are normal. Pupils are equal, round, and reactive to light. Right eye exhibits no discharge. Left eye exhibits no discharge. No scleral icterus.  Neck: Neck supple.  Cardiovascular: Normal rate, regular rhythm and normal heart sounds.   Pulmonary/Chest: Effort normal and breath sounds normal. No respiratory distress. She has no wheezes. She has no rales.  Abdominal: Soft. There is no tenderness.  Musculoskeletal: She exhibits no tenderness.  Point tenderness along the intercostal spaces below the right breast. Patient remarks this is the "exact pain". No other obvious deformities, step-offs or lesions appreciated  Neurological: She is alert and oriented to person, place, and time.  Cranial Nerves II-XII grossly intact  Skin: Skin is warm and dry. No rash noted.  Psychiatric: She has a normal mood and affect.  Nursing note and vitals reviewed.   ED Course  Procedures (including critical care time) Labs Review Labs Reviewed  BASIC METABOLIC PANEL - Abnormal; Notable for the following:    Sodium 135 (*)    Glucose, Bld 338 (*)    GFR calc non Af Amer 65 (*)    GFR calc Af Amer 75 (*)    All other components within normal limits  CBC  I-STAT TROPOININ, ED    Imaging Review Dg Chest 2 View  02/19/2014   CLINICAL DATA:  Right-sided pain.  EXAM: CHEST  2 VIEW  COMPARISON:  Radiograph since 09/23/2013  FINDINGS: Bilateral stimulators in the anterior chest wall.  Normal cardiac silhouette. There is elevation of the right hemidiaphragm with associated atelectasis. Left lung base is clear. No pulmonary edema pneumothorax.  IMPRESSION: Chronic elevation of the right hemidiaphragm with associated atelectasis. No significant change from prior.   Electronically Signed   By: Suzy Bouchard M.D.   On: 02/19/2014 15:56     EKG Interpretation None      MDM  Vitals stable - WNL -afebrile, pt not tachycardic on my exam. Pt resting comfortably in ED.  PE not  concerning  for acute or emergent pathology. Labwork noncontributory Imaging shows no acute cardiopulmonary pathology Chest discomfort likely MSK in nature as pain reproducible on PE. Will provide symptomatic support with anti-tussives. Incentive Spirometry given.   Will DC with Delsym and Naproxen for pain/inflammation management. Can continue home Vicodin Discussed f/u with PCP and return precautions, pt very amenable to plan. Pt stable, in good condition and is appropriate for discharge Prior to patient discharge, I discussed and reviewed this case with Dr.Linker  Final diagnoses:  Cough  Right-sided chest wall pain       Verl Dicker, PA-C 02/20/14 [place], MD 02/24/14 [id number]

## 2014-02-19 NOTE — ED Notes (Signed)
Pt with right sided rib pain.  Pt sts she was seen at [facility] and given antibiotics and takes percocet for the pain with relief.  Pt sts pain is present although she has been taking her antibiotics as prescribed for her atelectasis; reports coughing up yellow sputum. Nad.

## 2014-02-19 NOTE — Discharge Instructions (Signed)

## 2014-02-20 ENCOUNTER — Ambulatory Visit: Payer: Medicare Other | Admitting: Internal Medicine

## 2014-03-02 ENCOUNTER — Ambulatory Visit (INDEPENDENT_AMBULATORY_CARE_PROVIDER_SITE_OTHER): Payer: Medicare Other | Admitting: Internal Medicine

## 2014-03-02 ENCOUNTER — Encounter: Payer: Self-pay | Admitting: Internal Medicine

## 2014-03-02 VITALS — BP 100/68 | HR 106 | Ht 64.5 in | Wt 203.6 lb

## 2014-03-02 DIAGNOSIS — E119 Type 2 diabetes mellitus without complications: Secondary | ICD-10-CM

## 2014-03-02 DIAGNOSIS — J4541 Moderate persistent asthma with (acute) exacerbation: Secondary | ICD-10-CM

## 2014-03-02 DIAGNOSIS — J209 Acute bronchitis, unspecified: Secondary | ICD-10-CM | POA: Insufficient documentation

## 2014-03-02 DIAGNOSIS — Z23 Encounter for immunization: Secondary | ICD-10-CM

## 2014-03-02 MED ORDER — PROMETHAZINE-CODEINE 6.25-10 MG/5ML PO SYRP: 5.0000 mL | ORAL_SOLUTION | Freq: Four times a day (QID) | ORAL | Status: DC | PRN

## 2014-03-02 MED ORDER — LEVOFLOXACIN 500 MG PO TABS: 500.0000 mg | ORAL_TABLET | Freq: Every day | ORAL | Status: DC
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
## 2014-03-02 NOTE — Patient Instructions (Signed)
Prevnar 13 pneumonia vaccine  Script sent for levaquin antibiotic  Script printed for cough syrup  Please call as needed  You can cancel the January appointment

## 2014-03-02 NOTE — Assessment & Plan Note (Signed)
Discussed desire to minimize steroid therapy if possible, for better blood sugar control

## 2014-03-02 NOTE — Assessment & Plan Note (Signed)
We hope to avoid steroids, which may be required to keep her from another emergency room visit, because of her diabetes Plan-Levaquin, Prevnar 13 with appropriate discussion

## 2014-03-08 ENCOUNTER — Ambulatory Visit: Payer: Medicare Other | Admitting: Neurology

## 2014-03-08 ENCOUNTER — Ambulatory Visit (INDEPENDENT_AMBULATORY_CARE_PROVIDER_SITE_OTHER): Payer: Medicare Other | Admitting: Neurology

## 2014-03-08 ENCOUNTER — Encounter: Payer: Self-pay | Admitting: Neurology

## 2014-03-08 VITALS — BP 130/70 | HR 92 | Ht 64.5 in | Wt 203.0 lb

## 2014-03-08 DIAGNOSIS — Z9889 Other specified postprocedural states: Secondary | ICD-10-CM

## 2014-03-08 DIAGNOSIS — Z9689 Presence of other specified functional implants: Secondary | ICD-10-CM

## 2014-03-08 DIAGNOSIS — G250 Essential tremor: Secondary | ICD-10-CM

## 2014-03-08 NOTE — Procedures (Signed)
DBS Programming was performed.    Total time spent programming was 45 minutes.  Device was initially off, as this was a new start.  The device was turned on.  Soft start was confirmed to be on.  Impedences were checked and were within normal limits.  Battery was checked and was determined to be functioning normally and not near the end of life.  Final settings were as follows:  Left brain electrode:     1-2+           ; Amplitude  2.3   V   ; Pulse width 90 microseconds;   Frequency   130   Hz.  Right brain electrode:     2-1+          ; Amplitude   2.3  V ;  Pulse width 60  microseconds;  Frequency   130    Hz.

## 2014-03-08 NOTE — Progress Notes (Signed)
Subjective:   [name] was seen in f/u today.  DBS surgery to the bilateral VIM done 12/04/13 with bilateral generator placement on 12/15/13.  She has had several falls.  With several of these, she picked up something and fell when bending over.  The L leg is also giving way and seems a bit weak.  She states that there is some numbness in the top of the left leg as well.  There has been some back pain as well.  With each fall, the left leg ends up underneath her.  Tremor has been better.  Speech stable.   Admits that she has been scratching at the battery area and it has been red because of it.    01/10/14 update:  Overall, pt has been doing well.  She had another fall since last visit.   After she got the walker, she hasn't fallen since.   Is very happy with the DBS surgery.   Pt states that she can carry a cup with open lids now.  Has back pain and L thigh pain.  Had EMG today.  D/W Dr. Posey Pronto.  Acute radiculopathy, primarily of L4.  03/08/14 update:  The patient is seen back in follow-up, accompanied by her husband who supplements the history.  Records since last visit were reviewed.  The patient was seen in the emergency room on 01/21/2014 secondary to angioedema, which was presumed secondary to her lisinopril which was discontinued.  She was seen back in the emergency room on 01/29/2014 with an asthma attack and the next day she was seen there with high blood sugars due to the prednisone that was given for her asthma attack.  She was again seen in the emergency room on 02/19/2014 with chest pain, that was ultimately felt musculoskeletal secondary to coughing.  In regards to tremor, the patient states that she has overall been doing well.  She is happy with results of surgery.  She does note some facial tremor.  Pt does report that she had an injection in her back since last visit, done by Dr. Vertell Limber.  She states that her back is doing much better but now the same knee is giving out.  She fell Saturday  as wasn't using her cane or walker and her knee gave out.    Allergies  Allergen Reactions  . Losartan     Angioedema   . Metformin And Related Diarrhea  . Morphine And Related Other (See Comments)    Pt. States med makes her crazy  . Oxycodone-Acetaminophen Itching    TYLOX. Caused internal itching per patient   . Guaifenesin Er Other (See Comments)    MUCINEX REACTION: asthma attacks    Outpatient Encounter Prescriptions as of 03/08/2014  Medication Sig  . albuterol (PROAIR HFA) 108 (90 BASE) MCG/ACT inhaler Inhale 2 puffs into the lungs every 6 (six) hours as needed for wheezing or shortness of breath.  Marland Kitchen albuterol (PROVENTIL) (2.5 MG/3ML) 0.083% nebulizer solution Take 3 mLs (2.5 mg total) by nebulization every 6 (six) hours as needed for wheezing or shortness of breath.  Marland Kitchen amLODipine (NORVASC) 10 MG tablet Take 1 tablet (10 mg total) by mouth daily.  Marland Kitchen aspirin EC 81 MG tablet Take 81 mg by mouth every morning.   . DULoxetine (CYMBALTA) 60 MG capsule Take 1 capsule (60 mg total) by mouth daily.  Marland Kitchen gabapentin (NEURONTIN) 100 MG capsule Take 300 mg by mouth at bedtime.  Marland Kitchen HYDROcodone-acetaminophen (NORCO/VICODIN) 5-325 MG per tablet Take  1-2 tablets by mouth every 6 (six) hours as needed for moderate pain.  Marland Kitchen insulin NPH-regular Human (NOVOLIN 70/30) (70-30) 100 UNIT/ML injection 90 units with breakfast and 40 units with evening meal and syringes 2/day.  . levofloxacin (LEVAQUIN) 500 MG tablet Take 1 tablet (500 mg total) by mouth daily.  Marland Kitchen levothyroxine (SYNTHROID, LEVOTHROID) 75 MCG tablet Take 1 tablet (75 mcg total) by mouth daily before breakfast.  . metoprolol succinate (TOPROL-XL) 50 MG 24 hr tablet Take 1 tablet (50 mg total) by mouth 2 (two) times daily. Take with or immediately following a meal.  . primidone (MYSOLINE) 50 MG tablet TAKE THREE TABLETS BY MOUTH TWICE DAILY  . promethazine-codeine (PHENERGAN WITH CODEINE) 6.25-10 MG/5ML syrup Take 5 mLs by mouth every 6 (six)  hours as needed for cough.  . ranitidine (ZANTAC) 150 MG tablet Take 150 mg by mouth 2 (two) times daily.   . sertraline (ZOLOFT) 100 MG tablet Take one and one half tablets daily (Patient taking differently: Take 150 mg by mouth daily. )    Past Medical History  Diagnosis Date  . 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 [name], residual palpitations  . Vocal cord dysfunction   . Tremor, essential     takes Mysoline and Neurontin daily.  . Allergic rhinitis     uses Nasonex daily as needed  . Low sodium syndrome   . Chronic back pain     reason unknown  . Lumbar radiculopathy   . DDD (degenerative disc disease), lumbar   . Esophageal reflux     takes Zantac daily  . Hypertension     takes Losartan daily  . Anxiety     takes Ativan daily as needed  . Hypothyroidism     takes Synthroid daily  . Depression     takes Zoloft daily as well as Cymbalta  . Diabetes mellitus     takes NOvolin daily  . Asthma     Albuterol inhaler and Neb daily as needed  . History of bronchitis Dec 2014  . Seizures 04-05-13    one time ran out of Primidone and had stopped it cold [place]  . Weakness     in left arm  . Joint pain   . Joint swelling     left ankle  . Dysphagia   . Constipation   . Allergy to angiotensin receptor blockers (ARB) 01/22/2014    Angioedema    Past Surgical History  Procedure Laterality Date  . Tonsillectomy    . Right breast cyst      benign  . Left ankle ligament repair      x 2  . Left elbow repair    . Partial hysterectomy    . Cholecystectomy    . Cesarean section    . Ablation for avnrt    . Colonoscopy  01/16/2004    GHW:EXHBZJ rectum/colon  . Esophagogastroduodenoscopy (egd) with esophageal dilation  04/03/2002    IRC:VELFYBOF'B ring, otherwise normal esophagus, status post dilation with 54 F/Normal stomach  . Bartholin gland cyst excision      x 2  . Esophagogastroduodenoscopy (egd) with esophageal  dilation N/A 11/08/2012    Procedure: ESOPHAGOGASTRODUODENOSCOPY (EGD) WITH ESOPHAGEAL DILATION;  Surgeon: Daneil Dolin, MD;  Location: [facility] ENDO [facility]ITE;  Service: Endoscopy;  Laterality: N/A;  11:30  . Subthalamic stimulator insertion Bilateral 12/04/2013    Procedure: Bilateral Deep brain stimulator placement;  Surgeon: [name],  MD;  Location: [place] NEURO ORS;  Service: Neurosurgery;  Laterality: Bilateral;  Bilateral Deep brain stimulator placement  . Pulse generator implant Bilateral 12/15/2013    Procedure: BILATERAL PULSE GENERATOR IMPLANT;  Surgeon: [name], MD;  Location: [place] NEURO ORS;  Service: Neurosurgery;  Laterality: Bilateral;  BILATERAL    History   Social History  . Marital Status: Married    Spouse Name: N/A    Number of Children: N/A  . Years of Education: N/A   Occupational History  . retired    Social History Main Topics  . Smoking status: Former Smoker -- 0.30 packs/day for 10 years    Types: Cigarettes  . Smokeless tobacco: Never Used     Comment: quit smoking 25+yrs ago  . Alcohol Use: No     Comment: no alcohol in 82yrs  . Drug Use: No  . Sexual Activity: No   Other Topics Concern  . Not on file   Social History Narrative   Married, 1 daughte, living.  Lives with husband in [place], [place].   1 child died brain tumor at age 26.   Two grandchildren.   Works at [employer], patient currently lost her job.   Retired 2011.   No tobacco.   Alcohol: none in 30 yrs.  Distant history of heavy use.   No drug use.    Family Status  Relation Status Death Age  . Father Deceased     lung cancer, diabetes  . Mother Deceased     COPD, diabetes  . Son Deceased 7    brain tumor (neuroblastoma)  . Sister Deceased     blood clot  . Daughter Alive     healthy    Review of Systems A complete 10 system ROS was obtained and was negative apart from what is mentioned.   Objective:   VITALS:   Filed Vitals:   03/08/14 1111  BP: 130/70  Pulse: 92    Height: 5' 4.5" (1.638 m)  Weight: 203 lb (92.08 kg)   Gen: Appears stated age and in NAD.  HEENT: Normocephalic.  Scalp incisions are well healed.  The mucous membranes are moist. The superficial temporal arteries are without ropiness or tenderness.  Cardiovascular: Regular rate and rhythm.  Lungs: Clear to auscultation bilaterally.  Neck: There are no carotid bruits noted bilaterally.  Skin: incisions are all well healed now  NEUROLOGICAL:  Orientation: The patient is alert and oriented x 3.  Cranial nerves: There is good facial symmetry.  Speech is fluent and clear. There is a vocal tremor and just slight pseudobulbar quality to the voice. Soft palate rises symmetrically and there is no tongue deviation. Hearing is intact to conversational tone.  Tone: Tone is good throughout.  Sensation: Sensation is decreased over the L anterior medial and lateral thigh (no particular dermatomal level though) compared to the R Coordination: The patient has no dysdiadichokinesia or dysmetria.  Motor: Strength is at least antigravity x 4 Gait and Station: The patient is not particularly unstable today (states that she isn't when on level surface) DTR's:  2-/4 at the bilateral biceps, triceps, brachioradialis, right patella, absent at the left patella and bilateral Achilles MOVEMENT EXAM:  Tremor: There is tremor in the UE, most evident on the left with intention.There is head tremor at rest that is complex head titubation.   DBS programming was performed today which is described in more detail in a separate programming procedural notes.  In brief, there was improvement following  programming of the L tremor and she was able to write better with the R hand.   Assessment/Plan:   1.  Essential Tremor.  -This is evidenced by the symmetrical nature and longstanding hx of gradually getting worse.  -The patient is status post bilateral VIM DBS on 12/04/2013 with bilateral generator placement on 12/15/2013.   Postoperative imaging indicates that leads are perhaps just slightly too lateral and more inferior than anticipated.  -DBS device was programmed again today.  Neither side turned up high but R sided tremor well controlled.  Programming more challenging on the R brain (L body) due to lead position and SE with programming of dyskinesia and face pull.  Programming device slowly on that side and pt happy with results.  -decrease primidone from 150 mg bid to 100 mg bid 2.  Cellulitis, L battery site  -resolved after antibiotics 3.  L4 radiculopathy  -Sx's better after injections.   Still some falls but pt thinks that is related to knee issues 4.  F/u with me in next 8 weeks.

## 2014-03-09 ENCOUNTER — Telehealth: Payer: Self-pay | Admitting: Internal Medicine

## 2014-03-09 NOTE — Telephone Encounter (Signed)
Sorry she had the reaction. The rest of this should fade without doing anything more. Sounds as if she had a good strong immune response and should get good protection. We don't plan on repeating pneumonia vaccine for her.

## 2014-03-09 NOTE — Telephone Encounter (Signed)
Pt aware of recs.  Nothing further needed. 

## 2014-03-09 NOTE — Telephone Encounter (Signed)
Called and spoke with pt and she stated that she got the prevnar shot last week---she stated that at first her arm was red and very sore but this has gone away.  Now she has some swelling and has a knot in the center of where the vaccine was given.  Pt wanted to see if she needed to do anything about this.  CY please advise thanks  Allergies  Allergen Reactions  . Losartan     Angioedema   . Metformin And Related Diarrhea  . Morphine And Related Other (See Comments)    Pt. States med makes her crazy  . Oxycodone-Acetaminophen Itching    TYLOX. Caused internal itching per patient   . Guaifenesin Er Other (See Comments)    MUCINEX REACTION: asthma attacks    Current Outpatient Prescriptions on File Prior to Visit  Medication Sig Dispense Refill  . albuterol (PROAIR HFA) 108 (90 BASE) MCG/ACT inhaler Inhale 2 puffs into the lungs every 6 (six) hours as needed for wheezing or shortness of breath. 8.5 g 5  . albuterol (PROVENTIL) (2.5 MG/3ML) 0.083% nebulizer solution Take 3 mLs (2.5 mg total) by nebulization every 6 (six) hours as needed for wheezing or shortness of breath. 360 mL 3  . amLODipine (NORVASC) 10 MG tablet Take 1 tablet (10 mg total) by mouth daily. 30 tablet 0  . aspirin EC 81 MG tablet Take 81 mg by mouth every morning.     . DULoxetine (CYMBALTA) 60 MG capsule Take 1 capsule (60 mg total) by mouth daily. 30 capsule 2  . gabapentin (NEURONTIN) 100 MG capsule Take 300 mg by mouth at bedtime.    Marland Kitchen HYDROcodone-acetaminophen (NORCO/VICODIN) 5-325 MG per tablet Take 1-2 tablets by mouth every 6 (six) hours as needed for moderate pain. 100 tablet 0  . insulin NPH-regular Human (NOVOLIN 70/30) (70-30) 100 UNIT/ML injection 90 units with breakfast and 40 units with evening meal and syringes 2/day.    . levofloxacin (LEVAQUIN) 500 MG tablet Take 1 tablet (500 mg total) by mouth daily. 7 tablet 0  . levothyroxine (SYNTHROID, LEVOTHROID) 75 MCG tablet Take 1 tablet (75 mcg total) by mouth  daily before breakfast. 90 tablet 1  . metoprolol succinate (TOPROL-XL) 50 MG 24 hr tablet Take 1 tablet (50 mg total) by mouth 2 (two) times daily. Take with or immediately following a meal. 60 tablet 11  . primidone (MYSOLINE) 50 MG tablet TAKE THREE TABLETS BY MOUTH TWICE DAILY 180 tablet 3  . promethazine-codeine (PHENERGAN WITH CODEINE) 6.25-10 MG/5ML syrup Take 5 mLs by mouth every 6 (six) hours as needed for cough. 240 mL 0  . ranitidine (ZANTAC) 150 MG tablet Take 150 mg by mouth 2 (two) times daily.     . sertraline (ZOLOFT) 100 MG tablet Take one and one half tablets daily (Patient taking differently: Take 150 mg by mouth daily. ) 45 tablet 2   Current Facility-Administered Medications on File Prior to Visit  Medication Dose Route Frequency Provider Last Rate Last Dose  . levalbuterol (XOPENEX) nebulizer solution 0.63 mg  0.63 mg Nebulization Once Deneise Lever, MD

## 2014-03-23 ENCOUNTER — Other Ambulatory Visit: Payer: Self-pay | Admitting: Family Medicine

## 2014-03-23 NOTE — Telephone Encounter (Signed)
Called pt to verify pt's refill request. Unable to leave a msg.

## 2014-03-26 ENCOUNTER — Other Ambulatory Visit: Payer: Self-pay | Admitting: *Deleted

## 2014-03-26 MED ORDER — AMLODIPINE BESYLATE 10 MG PO TABS: 10.0000 mg | ORAL_TABLET | Freq: Every day | ORAL | Status: DC

## 2014-03-26 NOTE — Telephone Encounter (Signed)
Left msg on triage needing new rx sent on amlodipine. Called pt back verified which pharmacy inform will send to [place]...[name]

## 2014-03-29 NOTE — Telephone Encounter (Signed)
Pt is now seeing neurology. Refill denied.

## 2014-04-05 ENCOUNTER — Encounter (HOSPITAL_COMMUNITY): Payer: Self-pay | Admitting: Cardiology

## 2014-04-05 ENCOUNTER — Emergency Department (HOSPITAL_COMMUNITY): Payer: Medicare Other

## 2014-04-05 ENCOUNTER — Emergency Department (HOSPITAL_COMMUNITY)
Admission: EM | Admit: 2014-04-05 | Discharge: 2014-04-05 | Disposition: A | Discharge status: Home / Self Care | Payer: Medicare Other | Attending: Emergency Medicine | Admitting: Emergency Medicine

## 2014-04-05 DIAGNOSIS — Z79899 Other long term (current) drug therapy: Secondary | ICD-10-CM | POA: Diagnosis not present

## 2014-04-05 DIAGNOSIS — Z87891 Personal history of nicotine dependence: Secondary | ICD-10-CM | POA: Diagnosis not present

## 2014-04-05 DIAGNOSIS — I10 Essential (primary) hypertension: Secondary | ICD-10-CM | POA: Insufficient documentation

## 2014-04-05 DIAGNOSIS — F419 Anxiety disorder, unspecified: Secondary | ICD-10-CM | POA: Diagnosis not present

## 2014-04-05 DIAGNOSIS — J45909 Unspecified asthma, uncomplicated: Secondary | ICD-10-CM | POA: Diagnosis not present

## 2014-04-05 DIAGNOSIS — R531 Weakness: Secondary | ICD-10-CM | POA: Diagnosis not present

## 2014-04-05 DIAGNOSIS — Z792 Long term (current) use of antibiotics: Secondary | ICD-10-CM | POA: Diagnosis not present

## 2014-04-05 DIAGNOSIS — W1839XA Other fall on same level, initial encounter: Secondary | ICD-10-CM | POA: Insufficient documentation

## 2014-04-05 DIAGNOSIS — E039 Hypothyroidism, unspecified: Secondary | ICD-10-CM | POA: Diagnosis not present

## 2014-04-05 DIAGNOSIS — Y9289 Other specified places as the place of occurrence of the external cause: Secondary | ICD-10-CM | POA: Diagnosis not present

## 2014-04-05 DIAGNOSIS — M5416 Radiculopathy, lumbar region: Secondary | ICD-10-CM

## 2014-04-05 DIAGNOSIS — Y9389 Activity, other specified: Secondary | ICD-10-CM | POA: Insufficient documentation

## 2014-04-05 DIAGNOSIS — K219 Gastro-esophageal reflux disease without esophagitis: Secondary | ICD-10-CM | POA: Insufficient documentation

## 2014-04-05 DIAGNOSIS — G8929 Other chronic pain: Secondary | ICD-10-CM | POA: Diagnosis not present

## 2014-04-05 DIAGNOSIS — E119 Type 2 diabetes mellitus without complications: Secondary | ICD-10-CM | POA: Diagnosis not present

## 2014-04-05 DIAGNOSIS — W19XXXA Unspecified fall, initial encounter: Secondary | ICD-10-CM

## 2014-04-05 DIAGNOSIS — S8992XA Unspecified injury of left lower leg, initial encounter: Secondary | ICD-10-CM | POA: Diagnosis present

## 2014-04-05 DIAGNOSIS — S3992XA Unspecified injury of lower back, initial encounter: Secondary | ICD-10-CM | POA: Diagnosis not present

## 2014-04-05 DIAGNOSIS — Y998 Other external cause status: Secondary | ICD-10-CM | POA: Insufficient documentation

## 2014-04-05 DIAGNOSIS — F329 Major depressive disorder, single episode, unspecified: Secondary | ICD-10-CM | POA: Insufficient documentation

## 2014-04-05 DIAGNOSIS — R29898 Other symptoms and signs involving the musculoskeletal system: Secondary | ICD-10-CM

## 2014-04-05 DIAGNOSIS — M25562 Pain in left knee: Secondary | ICD-10-CM

## 2014-04-05 DIAGNOSIS — G40909 Epilepsy, unspecified, not intractable, without status epilepticus: Secondary | ICD-10-CM | POA: Diagnosis not present

## 2014-04-05 DIAGNOSIS — Z7982 Long term (current) use of aspirin: Secondary | ICD-10-CM | POA: Diagnosis not present

## 2014-04-05 MED ORDER — PREDNISONE 50 MG PO TABS: 60.0000 mg | ORAL_TABLET | Freq: Once | ORAL | Status: DC

## 2014-04-05 MED ORDER — HYDROCODONE-ACETAMINOPHEN 5-325 MG PO TABS
1.0000 | ORAL_TABLET | Freq: Once | ORAL | Status: AC
  Administered 2014-04-05: 1 via ORAL
  Filled 2014-04-05: qty 1

## 2014-04-05 MED ORDER — PREDNISONE 20 MG PO TABS
40.0000 mg | ORAL_TABLET | Freq: Once | ORAL | Status: AC
  Administered 2014-04-05: 40 mg via ORAL
  Filled 2014-04-05: qty 2

## 2014-04-05 MED ORDER — HYDROCODONE-ACETAMINOPHEN 5-325 MG PO TABS: 1.0000 | ORAL_TABLET | ORAL | Status: DC | PRN

## 2014-04-05 MED ORDER — PREDNISONE 10 MG PO TABS: ORAL_TABLET | ORAL | Status: DC

## 2014-04-05 NOTE — Discharge Instructions (Signed)
Knee Pain The knee is the complex joint between your thigh and your lower leg. It is made up of bones, tendons, ligaments, and cartilage. The bones that make up the knee are:  The femur in the thigh.  The tibia and fibula in the lower leg.  The patella or kneecap riding in the groove on the lower femur. CAUSES  Knee pain is a common complaint with many causes. A few of these causes are:  Injury, such as:  A ruptured ligament or tendon injury.  Torn cartilage.  Medical conditions, such as:  Gout  Arthritis  Infections  Overuse, over training, or overdoing a physical activity. Knee pain can be minor or severe. Knee pain can accompany debilitating injury. Minor knee problems often respond well to self-care measures or get well on their own. More serious injuries may need medical intervention or even surgery. SYMPTOMS The knee is complex. Symptoms of knee problems can vary widely. Some of the problems are:  Pain with movement and weight bearing.  Swelling and tenderness.  Buckling of the knee.  Inability to straighten or extend your knee.  Your knee locks and you cannot straighten it.  Warmth and redness with pain and fever.  Deformity or dislocation of the kneecap. DIAGNOSIS  Determining what is wrong may be very straight forward such as when there is an injury. It can also be challenging because of the complexity of the knee. Tests to make a diagnosis may include:  Your caregiver taking a history and doing a physical exam.  Routine X-rays can be used to rule out other problems. X-rays will not reveal a cartilage tear. Some injuries of the knee can be diagnosed by:  Arthroscopy a surgical technique by which a small video camera is inserted through tiny incisions on the sides of the knee. This procedure is used to examine and repair internal knee joint problems. Tiny instruments can be used during arthroscopy to repair the torn knee cartilage (meniscus).  Arthrography  is a radiology technique. A contrast liquid is directly injected into the knee joint. Internal structures of the knee joint then become visible on X-ray film.  An MRI scan is a non X-ray radiology procedure in which magnetic fields and a computer produce two- or three-dimensional images of the inside of the knee. Cartilage tears are often visible using an MRI scanner. MRI scans have largely replaced arthrography in diagnosing cartilage tears of the knee.  Blood work.  Examination of the fluid that helps to lubricate the knee joint (synovial fluid). This is done by taking a sample out using a needle and a syringe. TREATMENT The treatment of knee problems depends on the cause. Some of these treatments are:  Depending on the injury, proper casting, splinting, surgery, or physical therapy care will be needed.  Give yourself adequate recovery time. Do not overuse your joints. If you begin to get sore during workout routines, back off. Slow down or do fewer repetitions.  For repetitive activities such as cycling or running, maintain your strength and nutrition.  Alternate muscle groups. For example, if you are a weight lifter, work the upper body on one day and the lower body the next.  Either tight or weak muscles do not give the proper support for your knee. Tight or weak muscles do not absorb the stress placed on the knee joint. Keep the muscles surrounding the knee strong.  Take care of mechanical problems.  If you have flat feet, orthotics or special shoes may help.  See your caregiver if you need help. °¨ Arch supports, sometimes with wedges on the inner or outer aspect of the heel, can help. These can shift pressure away from the side of the knee most bothered by osteoarthritis. °¨ A brace called an "unloader" brace also may be used to help ease the pressure on the most arthritic side of the knee. °· If your caregiver has prescribed crutches, braces, wraps or ice, use as directed. The acronym  for this is PRICE. This means protection, rest, ice, compression, and elevation. °· Nonsteroidal anti-inflammatory drugs (NSAIDs), can help relieve pain. But if taken immediately after an injury, they may actually increase swelling. Take NSAIDs with food in your stomach. Stop them if you develop stomach problems. Do not take these if you have a history of ulcers, stomach pain, or bleeding from the bowel. Do not take without your caregiver's approval if you have problems with fluid retention, heart failure, or kidney problems. °· For ongoing knee problems, physical therapy may be helpful. °· Glucosamine and chondroitin are over-the-counter dietary supplements. Both may help relieve the pain of osteoarthritis in the knee. These medicines are different from the usual anti-inflammatory drugs. Glucosamine may decrease the rate of cartilage destruction. °· Injections of a corticosteroid drug into your knee joint may help reduce the symptoms of an arthritis flare-up. They may provide pain relief that lasts a few months. You may have to wait a few months between injections. The injections do have a small increased risk of infection, water retention, and elevated blood sugar levels. °· Hyaluronic acid injected into damaged joints may ease pain and provide lubrication. These injections may work by reducing inflammation. A series of shots may give relief for as long as 6 months. °· Topical painkillers. Applying certain ointments to your skin may help relieve the pain and stiffness of osteoarthritis. Ask your pharmacist for suggestions. Many over the-counter products are approved for temporary relief of arthritis pain. °· In some countries, doctors often prescribe topical NSAIDs for relief of chronic conditions such as arthritis and tendinitis. A review of treatment with NSAID creams found that they worked as well as oral medications but without the serious side effects. °PREVENTION °· Maintain a healthy weight. Extra pounds  put more strain on your joints. °· Get strong, stay limber. Weak muscles are a common cause of knee injuries. Stretching is important. Include flexibility exercises in your workouts. °· Be smart about exercise. If you have osteoarthritis, chronic knee pain or recurring injuries, you may need to change the way you exercise. This does not mean you have to stop being active. If your knees ache after jogging or playing basketball, consider switching to swimming, water aerobics, or other low-impact activities, at least for a few days a week. Sometimes limiting high-impact activities will provide relief. °· Make sure your shoes fit well. Choose footwear that is right for your sport. °· Protect your knees. Use the proper gear for knee-sensitive activities. Use kneepads when playing volleyball or laying carpet. Buckle your seat belt every time you drive. Most shattered kneecaps occur in car accidents. °· Rest when you are tired. °SEEK MEDICAL CARE IF:  °You have knee pain that is continual and does not seem to be getting better.  °SEEK IMMEDIATE MEDICAL CARE IF:  °Your knee joint feels hot to the touch and you have a high fever. °MAKE SURE YOU:  °· Understand these instructions. °· Will watch your condition. °· Will get help right away if you are not   doing well or get worse. Document Released: 01/18/2007 Document Revised: 06/15/2011 Document Reviewed: 01/18/2007 [facility] Patient Information 2015 [place], [place]. This information is not intended to replace advice given to you by your health care provider. Make sure you discuss any questions you have with your health care provider.  Lumbosacral Radiculopathy Lumbosacral radiculopathy is a pinched nerve or nerves in the low back (lumbosacral area). When this happens you may have weakness in your legs and may not be able to stand on your toes. You may have pain going down into your legs. There may be difficulties with walking normally. There are many causes of this  problem. Sometimes this may happen from an injury, or simply from arthritis or boney problems. It may also be caused by other illnesses such as diabetes. If there is no improvement after treatment, further studies may be done to find the exact cause. DIAGNOSIS  X-rays may be needed if the problems become long standing. Electromyograms may be done. This study is one in which the working of nerves and muscles is studied. HOME CARE INSTRUCTIONS   Applications of ice packs may be helpful. Ice can be used in a plastic bag with a towel around it to prevent frostbite to skin. This may be used every 2 hours for 20 to 30 minutes, or as needed, while awake, or as directed by your caregiver.  Only take over-the-counter or prescription medicines for pain, discomfort, or fever as directed by your caregiver.  If physical therapy was prescribed, follow your caregiver's directions. SEEK IMMEDIATE MEDICAL CARE IF:   You have pain not controlled with medications.  You seem to be getting worse rather than better.  You develop increasing weakness in your legs.  You develop loss of bowel or bladder control.  You have difficulty with walking or balance, or develop clumsiness in the use of your legs.  You have a fever. MAKE SURE YOU:   Understand these instructions.  Will watch your condition.  Will get help right away if you are not doing well or get worse. Document Released: 03/23/2005 Document Revised: 06/15/2011 Document Reviewed: 11/11/2007 [facility] Patient Information 2015 [place], [place]. This information is not intended to replace advice given to you by your health care provider. Make sure you discuss any questions you have with your health care provider.   Take your next dose of prednisone tomorrow evening. Follow up with Dr. Vertell Limber as scheduled.  You may take the hydrocodone prescribed for pain relief.  This will make you drowsy - do not drive within 4 hours of taking this medication.

## 2014-04-05 NOTE — ED Notes (Addendum)
C/o left leg pain.  States she has been falling a lot lately, her left leg has been giving away with her.   States this feels like nerve pain.  Last time she fell was one week ago.

## 2014-04-05 NOTE — ED Provider Notes (Signed)
CSN: [id number]     Arrival date & time 04/05/14  1257 History   First MD Initiated Contact with Patient 04/05/14 1259     Chief Complaint  Patient presents with  . Leg Pain     (Consider location/radiation/quality/duration/timing/severity/associated sxs/prior Treatment) The history is provided by the patient and the spouse.   [name] is a 63 y.o. female with a history of known lumbar degenerative disk disease, essential tremor with placement of a deep brain stimulator by Dr Vertell Limber, and lumbar radiculopathy with chronic left upper lateral thigh numbness which has been progressing and has been causing increasing episodes of falls.    She describes her left knee buckles causing her to collapse and describes no less than 15 falls over the past several months.  She usually lands with her left leg underneath her and presents today mainly do to left knee pain secondary to a fall incurred last week..  She also endorses urinary incontinence, but not consistently, stating she can control her bladder overnight, but frequently waits too long to empty her bladder during the day until it has become urgent to the point of incontinence.  She denies any symptoms of dysuria and is aware of urgency prior to micturition.  She is followed by [name] and also Dr. Vertell Limber who is aware of her current symptoms.  She reports having an MRI of her lumbar spine after her last visit with Dr. Vertell Limber in October showing she has a "pinched nerve" and anticipates an office visit with him in 2 weeks to set up lumbar surgery.  She is currently using a wheelchair and walker in the interim, for safer mobility.  She takes hydrocodone for pain relief which she ran out of yesterday.  Past Medical History  Diagnosis Date  . 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 [name], residual palpitations  . Vocal cord dysfunction   . Tremor, essential     takes Mysoline and Neurontin daily.  . Allergic  rhinitis     uses Nasonex daily as needed  . Low sodium syndrome   . Chronic back pain     reason unknown  . Lumbar radiculopathy   . DDD (degenerative disc disease), lumbar   . Esophageal reflux     takes Zantac daily  . Hypertension     takes Losartan daily  . Anxiety     takes Ativan daily as needed  . Hypothyroidism     takes Synthroid daily  . Depression     takes Zoloft daily as well as Cymbalta  . Diabetes mellitus     takes NOvolin daily  . Asthma     Albuterol inhaler and Neb daily as needed  . History of bronchitis Dec 2014  . Seizures 04-05-13    one time ran out of Primidone and had stopped it cold [place]  . Weakness     in left arm  . Joint pain   . Joint swelling     left ankle  . Dysphagia   . Constipation   . Allergy to angiotensin receptor blockers (ARB) 01/22/2014    Angioedema   Past Surgical History  Procedure Laterality Date  . Tonsillectomy    . Right breast cyst      benign  . Left ankle ligament repair      x 2  . Left elbow repair    . Partial hysterectomy    . Cholecystectomy    .  Cesarean section    . Ablation for avnrt    . Colonoscopy  01/16/2004    DJS:HFWYOV rectum/colon  . Esophagogastroduodenoscopy (egd) with esophageal dilation  04/03/2002    ZCH:YIFOYDXA'J ring, otherwise normal esophagus, status post dilation with 56 F/Normal stomach  . Bartholin gland cyst excision      x 2  . Esophagogastroduodenoscopy (egd) with esophageal dilation N/A 11/08/2012    Procedure: ESOPHAGOGASTRODUODENOSCOPY (EGD) WITH ESOPHAGEAL DILATION;  Surgeon: Daneil Dolin, MD;  Location: [facility];  Service: Endoscopy;  Laterality: N/A;  11:30  . Subthalamic stimulator insertion Bilateral 12/04/2013    Procedure: Bilateral Deep brain stimulator placement;  Surgeon: [name], MD;  Location: [place] [facility];  Service: Neurosurgery;  Laterality: Bilateral;  Bilateral Deep brain stimulator placement  . Pulse generator implant Bilateral 12/15/2013     Procedure: BILATERAL PULSE GENERATOR IMPLANT;  Surgeon: [name], MD;  Location: [place] [facility];  Service: Neurosurgery;  Laterality: Bilateral;  BILATERAL   Family History  Problem Relation Age of Onset  . Lung cancer Father     DIED AGE 30 LUNG CA  . Alcohol abuse Father   . Anxiety disorder Father   . Depression Father   . COPD Mother     DIED AGE 106,MRSA,COPD,PNEUMONIA  . Pneumonia Mother     DIED AGE 106,MRSA,COPD,PNEUMONIA  . Supraventricular tachycardia Mother   . Anxiety disorder Mother   . Depression Mother   . Alcohol abuse Mother   . COPD Brother     AGE 24  . Heart disease Sister     DIED AGE 84, SMOKER,?HEART  . Colon cancer Neg Hx    History  Substance Use Topics  . Smoking status: Former Smoker -- 0.30 packs/day for 10 years    Types: Cigarettes  . Smokeless tobacco: Never Used     Comment: quit smoking 25+yrs ago  . Alcohol Use: No     Comment: no alcohol in 59yrs   OB History    No data available     Review of Systems  Constitutional: Negative for fever.  Respiratory: Negative for shortness of breath.   Cardiovascular: Negative for chest pain and leg swelling.  Gastrointestinal: Negative for abdominal pain, constipation and abdominal distention.  Genitourinary: Negative for dysuria, urgency, frequency, flank pain, decreased urine volume and difficulty urinating.  Musculoskeletal: Positive for back pain and arthralgias. Negative for joint swelling and gait problem.  Skin: Negative for rash.  Neurological: Positive for weakness and numbness.      Allergies  Losartan; Metformin and related; Morphine and related; and Oxycodone-acetaminophen  Home Medications   Prior to Admission medications   Medication Sig Start Date End Date Taking? Authorizing Provider  albuterol (PROAIR HFA) 108 (90 BASE) MCG/ACT inhaler Inhale 2 puffs into the lungs every 6 (six) hours as needed for wheezing or shortness of breath. 08/03/13   Tammy S Parrett, NP  albuterol  (PROVENTIL) (2.5 MG/3ML) 0.083% nebulizer solution Take 3 mLs (2.5 mg total) by nebulization every 6 (six) hours as needed for wheezing or shortness of breath. 02/01/14   Deneise Lever, MD  amLODipine (NORVASC) 10 MG tablet Take 1 tablet (10 mg total) by mouth daily. 03/26/14   Janith Lima, MD  aspirin EC 81 MG tablet Take 81 mg by mouth every morning.     Historical Provider, MD  DULoxetine (CYMBALTA) 60 MG capsule Take 1 capsule (60 mg total) by mouth daily. 02/07/14 02/07/15  Levonne Spiller, MD  gabapentin (NEURONTIN) 100  MG capsule Take 300 mg by mouth at bedtime.    Historical Provider, MD  HYDROcodone-acetaminophen (NORCO/VICODIN) 5-325 MG per tablet Take 1-2 tablets by mouth every 6 (six) hours as needed for moderate pain. 02/12/14   Janith Lima, MD  insulin NPH-regular Human (NOVOLIN 70/30) (70-30) 100 UNIT/ML injection 90 units with breakfast and 40 units with evening meal and syringes 2/day. 10/13/13   Renato Shin, MD  levofloxacin (LEVAQUIN) 500 MG tablet Take 1 tablet (500 mg total) by mouth daily. 03/02/14   Deneise Lever, MD  levothyroxine (SYNTHROID, LEVOTHROID) 75 MCG tablet Take 1 tablet (75 mcg total) by mouth daily before breakfast. 02/12/14   Janith Lima, MD  metoprolol succinate (TOPROL-XL) 50 MG 24 hr tablet Take 1 tablet (50 mg total) by mouth 2 (two) times daily. Take with or immediately following a meal. 05/04/13   Janith Lima, MD  primidone (MYSOLINE) 50 MG tablet TAKE THREE TABLETS BY MOUTH TWICE DAILY 02/08/14   Janith Lima, MD  promethazine-codeine [facility] WITH CODEINE) 6.25-10 MG/5ML syrup Take 5 mLs by mouth every 6 (six) hours as needed for cough. 03/02/14   Deneise Lever, MD  ranitidine (ZANTAC) 150 MG tablet Take 150 mg by mouth 2 (two) times daily.     Historical Provider, MD  sertraline (ZOLOFT) 100 MG tablet Take one and one half tablets daily Patient taking differently: Take 150 mg by mouth daily.  02/07/14   Levonne Spiller, MD   BP 147/77 mmHg   Pulse 97  Temp(Src) 98.8 F (37.1 C) (Oral)  Resp 16  Ht 5' 4.5" (1.638 m)  Wt 213 lb (96.616 kg)  BMI 36.01 kg/m2  SpO2 99% Physical Exam  Constitutional: She appears well-developed and well-nourished.  HENT:  Head: Normocephalic.  Eyes: Conjunctivae are normal.  Neck: Normal range of motion. Neck supple.  Cardiovascular: Normal rate and intact distal pulses.   Pulses:      Dorsalis pedis pulses are 2+ on the right side, and 2+ on the left side.  Pedal pulses normal.  Pulmonary/Chest: Effort normal.  Abdominal: Soft. Bowel sounds are normal. She exhibits no distension and no mass.  Musculoskeletal: Normal range of motion. She exhibits no edema.       Left knee: She exhibits no swelling, no effusion, no ecchymosis, no deformity, normal alignment, no LCL laxity, normal meniscus and no MCL laxity. Tenderness found. Lateral joint line tenderness noted. No patellar tendon tenderness noted.       Lumbar back: She exhibits tenderness. She exhibits no swelling, no edema and no spasm.       Back:  ttp along left paralumbar area. Increased pain lateral knee joint with varus strain.  Neurological: She is alert. She has normal strength. She displays no atrophy and no tremor. No sensory deficit. Gait normal.  Reflex Scores:      Patellar reflexes are 1+ on the right side and 0 on the left side.      Achilles reflexes are 1+ on the right side and 1+ on the left side. No strength deficit noted in knee and ankle flexor and extensor muscle groups.  She does have proximal weakness in her left leg, she is unable to SLR with the left.  Decreased sensation to fine touch left lateral thigh.  Equal grip strength.   Skin: Skin is warm and dry.  Psychiatric: She has a normal mood and affect.  Nursing note and vitals reviewed.   ED Course  Procedures (including critical  care time) Labs Review Labs Reviewed - No data to display  Imaging Review Ct Lumbar Spine Wo Contrast  04/05/2014   CLINICAL  DATA:  Multiple falls lately.  Left leg pain.  EXAM: CT LUMBAR SPINE WITHOUT CONTRAST  TECHNIQUE: Multidetector CT imaging of the lumbar spine was performed without intravenous contrast administration. Multiplanar CT image reconstructions were also generated.  COMPARISON:  Lumbar spine plain film 10/30/2013  FINDINGS: Normal alignment. Degenerative disc disease changes at L4-5 with disc space narrowing and vacuum disc. No visible disc herniations by CT. No fracture. There appears to be mild bilateral neural foraminal narrowing at L4-5 due to facet disease.  IMPRESSION: Mild bilateral L4-5 neural foraminal narrowing due to degenerative facet disease and bony overgrowth.  No visible disc herniation.  MRI would be more sensitive for disc disease and nerve impingement.   Electronically Signed   By: Rolm Baptise M.D.   On: 04/05/2014 16:44   Dg Knee Complete 4 Views Left  04/05/2014   CLINICAL DATA:  Left knee pain. Recent falls. Most recent fall 1 week ago. Initial encounter.  EXAM: LEFT KNEE - COMPLETE 4+ VIEW  COMPARISON:  None.  FINDINGS: No acute bony abnormality. Specifically, no fracture, subluxation, or dislocation. Soft tissues are intact. No joint effusion. Joint spaces are maintained. Early spurring.  IMPRESSION: No acute bony abnormality.   Electronically Signed   By: Rolm Baptise M.D.   On: 04/05/2014 14:05     EKG Interpretation None      MDM   Final diagnoses:  Fall  Proximal leg weakness    Pt with worsened weakness in left hip flexor muscles with known lumbar ddd.  At her last office visit with [name], exam revealed she was able to flex her left hip against gravity, unable to do so today. Reflexes today similar to prior exam.  Pt was seen by Dr Wyvonnia Dusky, who suggested CT of lumbar spine (with brain stimulator, batteries in chest, MRI deferred).  Plan consult with neurosurgery once results obtained.  Pt ambulated in dept with assistance to bathroom, but was then able to ambulate in  bathroom and back by herself.  6:04 PM Call from Dr Annette Stable regarding pt.  He reviewed her MRI (completed in August showing advanced spondlylolisthesis) along with CT scan today.  He recommends steroid dose pack, pain medication and f/u as planned in 2 weeks.  Pt was prescribed this along with hydrocodone.      Evalee Jefferson, PA-C 04/05/14 1807  Evalee Jefferson, PA-C 04/05/14 1813  Ezequiel Essex, MD 04/05/14 [phone]

## 2014-04-09 ENCOUNTER — Ambulatory Visit (INDEPENDENT_AMBULATORY_CARE_PROVIDER_SITE_OTHER): Payer: Medicare Other | Admitting: Internal Medicine

## 2014-04-09 ENCOUNTER — Ambulatory Visit (INDEPENDENT_AMBULATORY_CARE_PROVIDER_SITE_OTHER)
Admission: RE | Admit: 2014-04-09 | Discharge: 2014-04-09 | Disposition: A | Discharge status: Home / Self Care | Payer: Medicare Other | Source: Ambulatory Visit | Attending: Internal Medicine | Admitting: Internal Medicine

## 2014-04-09 ENCOUNTER — Ambulatory Visit: Payer: Medicare Other | Admitting: Internal Medicine

## 2014-04-09 ENCOUNTER — Encounter: Payer: Self-pay | Admitting: Internal Medicine

## 2014-04-09 VITALS — BP 130/84 | HR 96 | Temp 98.4°F | Ht 64.5 in | Wt 210.0 lb

## 2014-04-09 DIAGNOSIS — S6991XA Unspecified injury of right wrist, hand and finger(s), initial encounter: Secondary | ICD-10-CM

## 2014-04-09 DIAGNOSIS — E119 Type 2 diabetes mellitus without complications: Secondary | ICD-10-CM

## 2014-04-09 DIAGNOSIS — M48061 Spinal stenosis, lumbar region without neurogenic claudication: Secondary | ICD-10-CM

## 2014-04-09 DIAGNOSIS — S6990XA Unspecified injury of unspecified wrist, hand and finger(s), initial encounter: Secondary | ICD-10-CM | POA: Insufficient documentation

## 2014-04-09 MED ORDER — HYDROCODONE-ACETAMINOPHEN 5-325 MG PO TABS: 1.0000 | ORAL_TABLET | Freq: Four times a day (QID) | ORAL | Status: DC | PRN

## 2014-04-09 MED ORDER — GLUCOSE BLOOD VI STRP: ORAL_STRIP | Status: DC

## 2014-04-09 NOTE — Progress Notes (Signed)
Informed patient that hand is not broken

## 2014-04-09 NOTE — Patient Instructions (Signed)
Hand Contusion °A hand contusion is a deep bruise on your hand area. Contusions are the result of an injury that caused bleeding under the skin. The contusion may turn blue, purple, or yellow. Minor injuries will give you a painless contusion, but more severe contusions may stay painful and swollen for a few weeks. °CAUSES  °A contusion is usually caused by a blow, trauma, or direct force to an area of the body. °SYMPTOMS  °· Swelling and redness of the injured area. °· Discoloration of the injured area. °· Tenderness and soreness of the injured area. °· Pain. °DIAGNOSIS  °The diagnosis can be made by taking a history and performing a physical exam. An X-ray, CT scan, or MRI may be needed to determine if there were any associated injuries, such as broken bones (fractures). °TREATMENT  °Often, the best treatment for a hand contusion is resting, elevating, icing, and applying cold compresses to the injured area. Over-the-counter medicines may also be recommended for pain control. °HOME CARE INSTRUCTIONS  °· Put ice on the injured area. °¨ Put ice in a plastic bag. °¨ Place a towel between your skin and the bag. °¨ Leave the ice on for 15-20 minutes, 03-04 times a day. °· Only take over-the-counter or prescription medicines as directed by your caregiver. Your caregiver may recommend avoiding anti-inflammatory medicines (aspirin, ibuprofen, and naproxen) for 48 hours because these medicines may increase bruising. °· If told, use an elastic wrap as directed. This can help reduce swelling. You may remove the wrap for sleeping, showering, and bathing. If your fingers become numb, cold, or blue, take the wrap off and reapply it more loosely. °· Elevate your hand with pillows to reduce swelling. °· Avoid overusing your hand if it is painful. °SEEK IMMEDIATE MEDICAL CARE IF:  °· You have increased redness, swelling, or pain in your hand. °· Your swelling or pain is not relieved with medicines. °· You have loss of feeling in  your hand or are unable to move your fingers. °· Your hand turns cold or blue. °· You have pain when you move your fingers. °· Your hand becomes warm to the touch. °· Your contusion does not improve in 2 days. °MAKE SURE YOU:  °· Understand these instructions. °· Will watch your condition. °· Will get help right away if you are not doing well or get worse. °Document Released: 09/12/2001 Document Revised: 12/16/2011 Document Reviewed: 09/14/2011 °Exit[employer]® Patient Information ©2015 [employer]. This information is not intended to replace advice given to you by your health care provider. Make sure you discuss any questions you have with your health care provider. ° °

## 2014-04-09 NOTE — Progress Notes (Signed)
   Subjective:    Patient ID: [name], female    DOB: 11-02-1950, 64 y.o.   MRN: [id number]  Hand Injury  The incident occurred 2 days ago. The incident occurred at home. The injury mechanism was a fall. The pain is present in the right hand. The quality of the pain is described as aching. The pain does not radiate. The pain is at a severity of 1/10. The pain is mild. The pain has been constant since the incident. Pertinent negatives include no chest pain, muscle weakness, numbness or tingling. She has tried acetaminophen for the symptoms. The treatment provided significant relief.      Review of Systems  Cardiovascular: Negative for chest pain.  Neurological: Negative for tingling and numbness.  All other systems reviewed and are negative.      Objective:   Physical Exam  Constitutional: She is oriented to person, place, and time. She appears well-developed and well-nourished. No distress.  HENT:  Head: Normocephalic and atraumatic.  Mouth/Throat: Oropharynx is clear and moist. No oropharyngeal exudate.  Eyes: Conjunctivae are normal. Right eye exhibits no discharge. Left eye exhibits no discharge. No scleral icterus.  Neck: Normal range of motion. Neck supple. No JVD present. No tracheal deviation present. No thyromegaly present.  Cardiovascular: Normal rate, regular rhythm, normal heart sounds and intact distal pulses.  Exam reveals no gallop and no friction rub.   No murmur heard. Pulmonary/Chest: Effort normal and breath sounds normal. No stridor. No respiratory distress. She has no wheezes. She has no rales. She exhibits no tenderness.  Abdominal: Soft. Bowel sounds are normal. She exhibits no distension and no mass. There is no tenderness. There is no rebound and no guarding.  Musculoskeletal: Normal range of motion. She exhibits no edema or tenderness.       Right hand: She exhibits bony tenderness, deformity and swelling. She exhibits normal range of motion, normal  two-point discrimination and normal capillary refill. Normal sensation noted. Normal strength noted.       Hands: Lymphadenopathy:    She has no cervical adenopathy.  Neurological: She is oriented to person, place, and time.  Skin: Skin is warm and dry. No rash noted. She is not diaphoretic. No erythema. No pallor.  Vitals reviewed.         Assessment & Plan:

## 2014-04-09 NOTE — Assessment & Plan Note (Signed)
The exam shows a contusion/abrasion The plain film is normal She will keep the area clean, dry, and protected She will RICE She will apply neosporin TID She will cont norco as needed for the pain

## 2014-04-10 ENCOUNTER — Telehealth: Payer: Self-pay | Admitting: Internal Medicine

## 2014-04-10 ENCOUNTER — Other Ambulatory Visit: Payer: Self-pay | Admitting: Internal Medicine

## 2014-04-10 DIAGNOSIS — E119 Type 2 diabetes mellitus without complications: Secondary | ICD-10-CM

## 2014-04-10 MED ORDER — GLUCOSE BLOOD VI STRP: ORAL_STRIP | Status: DC

## 2014-04-10 NOTE — Telephone Encounter (Signed)
Received call from patient stating that Pierz needed a DX code for blood glucose test strips. Pharmacy faxed sheet to [place], had direce to add DX E11.9 and sign. Faxed back to him. Attempted to call patient back but no answer.

## 2014-04-11 ENCOUNTER — Ambulatory Visit: Payer: Medicare Other | Admitting: Neurology

## 2014-04-11 ENCOUNTER — Other Ambulatory Visit: Payer: Self-pay | Admitting: *Deleted

## 2014-04-18 ENCOUNTER — Other Ambulatory Visit: Payer: Self-pay | Admitting: Neurosurgery

## 2014-04-23 ENCOUNTER — Encounter (HOSPITAL_COMMUNITY): Payer: Self-pay

## 2014-04-23 ENCOUNTER — Telehealth: Payer: Self-pay | Admitting: *Deleted

## 2014-04-23 ENCOUNTER — Encounter (HOSPITAL_COMMUNITY)
Admission: RE | Admit: 2014-04-23 | Discharge: 2014-04-23 | Disposition: A | Discharge status: Home / Self Care | Payer: Medicare Other | Source: Ambulatory Visit | Attending: Neurosurgery | Admitting: Neurosurgery

## 2014-04-23 ENCOUNTER — Other Ambulatory Visit: Payer: Self-pay | Admitting: Internal Medicine

## 2014-04-23 DIAGNOSIS — G4730 Sleep apnea, unspecified: Secondary | ICD-10-CM

## 2014-04-23 DIAGNOSIS — G4733 Obstructive sleep apnea (adult) (pediatric): Secondary | ICD-10-CM | POA: Insufficient documentation

## 2014-04-23 HISTORY — DX: Cardiac arrhythmia, unspecified: I49.9

## 2014-04-23 LAB — TYPE AND SCREEN
ABO/RH(D): O POS
Antibody Screen: NEGATIVE

## 2014-04-23 LAB — SURGICAL PCR SCREEN
MRSA, PCR: NEGATIVE
Staphylococcus aureus: NEGATIVE

## 2014-04-23 LAB — CBC
HCT: 40.6 % (ref 36.0–46.0)
Hemoglobin: 14.1 g/dL (ref 12.0–15.0)
MCH: 30.5 pg (ref 26.0–34.0)
MCHC: 34.7 g/dL (ref 30.0–36.0)
MCV: 87.7 fL (ref 78.0–100.0)
Platelets: 198 10*3/uL (ref 150–400)
RBC: 4.63 MIL/uL (ref 3.87–5.11)
RDW: 13.3 % (ref 11.5–15.5)
WBC: 7.5 10*3/uL (ref 4.0–10.5)

## 2014-04-23 LAB — BASIC METABOLIC PANEL
Anion gap: 10 (ref 5–15)
BUN: 7 mg/dL (ref 6–23)
CO2: 25 mmol/L (ref 19–32)
Calcium: 9 mg/dL (ref 8.4–10.5)
Chloride: 102 mEq/L (ref 96–112)
Creatinine, Ser: 0.74 mg/dL (ref 0.50–1.10)
GFR calc Af Amer: >90 mL/min (ref >90)
GFR calc non Af Amer: 87 mL/min — ABNORMAL LOW (ref >90)
Glucose, Bld: 207 mg/dL — ABNORMAL HIGH (ref 70–99)
Potassium: 4.4 mmol/L (ref 3.5–5.1)
Sodium: 137 mmol/L (ref 135–145)

## 2014-04-23 LAB — ABO/RH: ABO/RH(D): O POS

## 2014-04-23 MED ORDER — CEFAZOLIN SODIUM-DEXTROSE 2-3 GM-% IV SOLR
2.0000 g | INTRAVENOUS | Status: AC
  Administered 2014-04-24: 3 g via INTRAVENOUS
  Filled 2014-04-23: qty 50

## 2014-04-23 NOTE — Pre-Procedure Instructions (Signed)
[name]  04/23/2014   Your procedure is scheduled on:  Tuesday, Jan. 19th   Report to [facility] Admitting at 11:15 AM.             (Arrival time is per your surgeon's request)   Call this number if you have problems the morning of surgery: [phone]   Remember:   Do not eat food or drink liquids after midnight tonight.   Take these medicines the morning of surgery with A SIP OF WATER: Norvasc, Cymbalta, Norco, Levothyroxine, Metoprolol, Zantac.  Please use your inhalers the morning of surgery.              DO NOT TAKE ANY DIABETES MEDICATION THE MORNING OF SURGERY   Do not wear jewelry, make-up or nail polish.  Do not wear lotions, powders, or perfumes. You may not wear deodorant.  Do not shave 48 hours prior to surgery.    Do not bring valuables to the hospital.  [facility] is not responsible for any belongings or valuables.               Contacts, dentures or bridgework may not be worn into surgery.  Leave suitcase in the car. After surgery it may be brought to your room.  For patients admitted to the hospital, discharge time is determined by your treatment team.    Name and phone number of your driver:    Special Instructions: "Preparing for Surgery" instruction sheet.   Please read over the following fact sheets that you were given: Pain Booklet, Coughing and Deep Breathing, Blood Transfusion Information, MRSA Information and Surgical Site Infection Prevention

## 2014-04-23 NOTE — Progress Notes (Signed)
   04/23/14 1104  OBSTRUCTIVE SLEEP APNEA  Have you ever been diagnosed with sleep apnea through a sleep study? No  Do you snore loudly (loud enough to be heard through closed doors)?  1  Do you often feel tired, fatigued, or sleepy during the daytime? (thinks its d/t the vicodin)  Has anyone observed you stop breathing during your sleep? 0  Do you have, or are you being treated for high blood pressure? 1  BMI more than 35 kg/m2? 1  Age over 66 years old? 1  Neck circumference greater than 40 cm/16 inches? 1  Gender: 0  Obstructive Sleep Apnea Score 5  Score 4 or greater  Results sent to PCP

## 2014-04-23 NOTE — Telephone Encounter (Signed)
Routed Dr. Court Joy last office note to Debbie at [facility] as requested. Pt was last seen December 2014 in [place] office

## 2014-04-23 NOTE — Progress Notes (Addendum)
Patient sees [name] @ [facility] for her tremors.  LOV was in 03/2014. Dr. Rayann Heman did an ablation for her SVT.  Denies any problems since and hasn't been back to see him in awhile. Sees Dr Reather Converse in [place].  I have requested her last LOV with him.  DA

## 2014-04-24 ENCOUNTER — Encounter (HOSPITAL_COMMUNITY)
Admission: RE | Disposition: A | Discharge status: Home / Self Care | Payer: Self-pay | Source: Ambulatory Visit | Attending: Neurosurgery

## 2014-04-24 ENCOUNTER — Inpatient Hospital Stay (HOSPITAL_COMMUNITY): Payer: Medicare Other | Admitting: Anesthesiology

## 2014-04-24 ENCOUNTER — Inpatient Hospital Stay (HOSPITAL_COMMUNITY): Payer: Medicare Other

## 2014-04-24 ENCOUNTER — Encounter (HOSPITAL_COMMUNITY): Payer: Self-pay | Admitting: *Deleted

## 2014-04-24 ENCOUNTER — Inpatient Hospital Stay (HOSPITAL_COMMUNITY)
Admission: RE | Admit: 2014-04-24 | Discharge: 2014-04-26 | DRG: 460 | Disposition: A | Discharge status: Home / Self Care | Payer: Medicare Other | Source: Ambulatory Visit | Attending: Neurosurgery | Admitting: Neurosurgery

## 2014-04-24 DIAGNOSIS — E119 Type 2 diabetes mellitus without complications: Secondary | ICD-10-CM | POA: Diagnosis present

## 2014-04-24 DIAGNOSIS — G250 Essential tremor: Secondary | ICD-10-CM | POA: Diagnosis present

## 2014-04-24 DIAGNOSIS — I10 Essential (primary) hypertension: Secondary | ICD-10-CM | POA: Diagnosis present

## 2014-04-24 DIAGNOSIS — G4730 Sleep apnea, unspecified: Secondary | ICD-10-CM | POA: Diagnosis present

## 2014-04-24 DIAGNOSIS — K219 Gastro-esophageal reflux disease without esophagitis: Secondary | ICD-10-CM | POA: Diagnosis present

## 2014-04-24 DIAGNOSIS — M5440 Lumbago with sciatica, unspecified side: Secondary | ICD-10-CM | POA: Diagnosis present

## 2014-04-24 DIAGNOSIS — M4806 Spinal stenosis, lumbar region: Principal | ICD-10-CM | POA: Diagnosis present

## 2014-04-24 DIAGNOSIS — Z87891 Personal history of nicotine dependence: Secondary | ICD-10-CM | POA: Diagnosis not present

## 2014-04-24 DIAGNOSIS — M5489 Other dorsalgia: Secondary | ICD-10-CM | POA: Diagnosis present

## 2014-04-24 DIAGNOSIS — J45909 Unspecified asthma, uncomplicated: Secondary | ICD-10-CM | POA: Diagnosis present

## 2014-04-24 DIAGNOSIS — M419 Scoliosis, unspecified: Secondary | ICD-10-CM | POA: Diagnosis present

## 2014-04-24 DIAGNOSIS — E669 Obesity, unspecified: Secondary | ICD-10-CM | POA: Diagnosis present

## 2014-04-24 DIAGNOSIS — Z794 Long term (current) use of insulin: Secondary | ICD-10-CM | POA: Diagnosis not present

## 2014-04-24 DIAGNOSIS — Z6835 Body mass index (BMI) 35.0-35.9, adult: Secondary | ICD-10-CM

## 2014-04-24 DIAGNOSIS — Z419 Encounter for procedure for purposes other than remedying health state, unspecified: Secondary | ICD-10-CM

## 2014-04-24 HISTORY — PX: MAXIMUM ACCESS (MAS)POSTERIOR LUMBAR INTERBODY FUSION (PLIF) 1 LEVEL: SHX6368

## 2014-04-24 LAB — GLUCOSE, CAPILLARY
Glucose-Capillary: 201 mg/dL — ABNORMAL HIGH (ref 70–99)
Glucose-Capillary: 169 mg/dL — ABNORMAL HIGH (ref 70–99)
Glucose-Capillary: 176 mg/dL — ABNORMAL HIGH (ref 70–99)
Glucose-Capillary: 195 mg/dL — ABNORMAL HIGH (ref 70–99)

## 2014-04-24 SURGERY — FOR MAXIMUM ACCESS (MAS) POSTERIOR LUMBAR INTERBODY FUSION (PLIF) 1 LEVEL
Anesthesia: General | Site: Back

## 2014-04-24 MED ORDER — DULOXETINE HCL 60 MG PO CPEP
60.0000 mg | ORAL_CAPSULE | Freq: Every day | ORAL | Status: DC
  Administered 2014-04-24 – 2014-04-26 (×3): 60 mg via ORAL
  Filled 2014-04-24 (×3): qty 1

## 2014-04-24 MED ORDER — LIDOCAINE HCL (CARDIAC) 20 MG/ML IV SOLN
INTRAVENOUS | Status: AC
  Filled 2014-04-24: qty 5

## 2014-04-24 MED ORDER — PANTOPRAZOLE SODIUM 40 MG PO TBEC: 40.0000 mg | DELAYED_RELEASE_TABLET | Freq: Every day | ORAL | Status: DC

## 2014-04-24 MED ORDER — FENTANYL CITRATE 0.05 MG/ML IJ SOLN
INTRAMUSCULAR | Status: AC
  Filled 2014-04-24: qty 5

## 2014-04-24 MED ORDER — PANTOPRAZOLE SODIUM 40 MG IV SOLR
40.0000 mg | Freq: Every day | INTRAVENOUS | Status: DC
  Filled 2014-04-24: qty 40

## 2014-04-24 MED ORDER — LIDOCAINE HCL (CARDIAC) 20 MG/ML IV SOLN
INTRAVENOUS | Status: DC | PRN
  Administered 2014-04-24: 100 mg via INTRAVENOUS

## 2014-04-24 MED ORDER — SENNA 8.6 MG PO TABS
1.0000 | ORAL_TABLET | Freq: Two times a day (BID) | ORAL | Status: DC
  Administered 2014-04-24 – 2014-04-26 (×4): 8.6 mg via ORAL
  Filled 2014-04-24 (×4): qty 1

## 2014-04-24 MED ORDER — FAMOTIDINE 10 MG PO TABS
10.0000 mg | ORAL_TABLET | Freq: Every day | ORAL | Status: DC
  Administered 2014-04-25 – 2014-04-26 (×2): 10 mg via ORAL
  Filled 2014-04-24 (×2): qty 1

## 2014-04-24 MED ORDER — SODIUM CHLORIDE 0.9 % IJ SOLN: 3.0000 mL | INTRAMUSCULAR | Status: DC | PRN

## 2014-04-24 MED ORDER — ALBUTEROL SULFATE (2.5 MG/3ML) 0.083% IN NEBU: 2.5000 mg | INHALATION_SOLUTION | Freq: Four times a day (QID) | RESPIRATORY_TRACT | Status: DC | PRN

## 2014-04-24 MED ORDER — CEFAZOLIN SODIUM 1-5 GM-% IV SOLN
1.0000 g | Freq: Three times a day (TID) | INTRAVENOUS | Status: AC
  Administered 2014-04-24 – 2014-04-25 (×2): 1 g via INTRAVENOUS
  Filled 2014-04-24 (×2): qty 50

## 2014-04-24 MED ORDER — MORPHINE SULFATE 2 MG/ML IJ SOLN
1.0000 mg | INTRAMUSCULAR | Status: DC | PRN
  Administered 2014-04-24: 2 mg via INTRAVENOUS
  Filled 2014-04-24: qty 1

## 2014-04-24 MED ORDER — HYDROMORPHONE HCL 1 MG/ML IJ SOLN
INTRAMUSCULAR | Status: AC
  Filled 2014-04-24: qty 1

## 2014-04-24 MED ORDER — POTASSIUM CHLORIDE IN NACL 20-0.9 MEQ/L-% IV SOLN: INTRAVENOUS | Status: DC

## 2014-04-24 MED ORDER — METHOCARBAMOL 500 MG PO TABS
500.0000 mg | ORAL_TABLET | Freq: Four times a day (QID) | ORAL | Status: DC | PRN
  Administered 2014-04-26: 500 mg via ORAL
  Filled 2014-04-24: qty 1

## 2014-04-24 MED ORDER — LACTATED RINGERS IV SOLN
INTRAVENOUS | Status: DC | PRN
  Administered 2014-04-24: 13:00:00 via INTRAVENOUS

## 2014-04-24 MED ORDER — BISACODYL 10 MG RE SUPP: 10.0000 mg | Freq: Every day | RECTAL | Status: DC | PRN

## 2014-04-24 MED ORDER — MENTHOL 3 MG MT LOZG: 1.0000 | LOZENGE | OROMUCOSAL | Status: DC | PRN

## 2014-04-24 MED ORDER — HYDROCODONE-ACETAMINOPHEN 5-325 MG PO TABS: 1.0000 | ORAL_TABLET | Freq: Four times a day (QID) | ORAL | Status: DC | PRN

## 2014-04-24 MED ORDER — ZOLPIDEM TARTRATE 5 MG PO TABS: 5.0000 mg | ORAL_TABLET | Freq: Every evening | ORAL | Status: DC | PRN

## 2014-04-24 MED ORDER — SUCCINYLCHOLINE CHLORIDE 20 MG/ML IJ SOLN
INTRAMUSCULAR | Status: DC | PRN
  Administered 2014-04-24: 120 mg via INTRAVENOUS

## 2014-04-24 MED ORDER — MIDAZOLAM HCL 5 MG/5ML IJ SOLN
INTRAMUSCULAR | Status: DC | PRN
  Administered 2014-04-24: 2 mg via INTRAVENOUS

## 2014-04-24 MED ORDER — INSULIN ASPART 100 UNIT/ML ~~LOC~~ SOLN
0.0000 [IU] | Freq: Three times a day (TID) | SUBCUTANEOUS | Status: DC
  Administered 2014-04-25: 8 [IU] via SUBCUTANEOUS
  Administered 2014-04-25 – 2014-04-26 (×3): 5 [IU] via SUBCUTANEOUS
  Administered 2014-04-26: 8 [IU] via SUBCUTANEOUS

## 2014-04-24 MED ORDER — INSULIN ASPART 100 UNIT/ML ~~LOC~~ SOLN
4.0000 [IU] | Freq: Three times a day (TID) | SUBCUTANEOUS | Status: DC
  Administered 2014-04-25 – 2014-04-26 (×5): 4 [IU] via SUBCUTANEOUS

## 2014-04-24 MED ORDER — POLYETHYLENE GLYCOL 3350 17 G PO PACK
17.0000 g | PACK | Freq: Every day | ORAL | Status: DC | PRN
  Administered 2014-04-26: 17 g via ORAL
  Filled 2014-04-24: qty 1

## 2014-04-24 MED ORDER — METOPROLOL SUCCINATE ER 25 MG PO TB24
50.0000 mg | ORAL_TABLET | Freq: Two times a day (BID) | ORAL | Status: DC
  Administered 2014-04-24 – 2014-04-26 (×4): 50 mg via ORAL
  Filled 2014-04-24 (×4): qty 2

## 2014-04-24 MED ORDER — LEVOTHYROXINE SODIUM 50 MCG PO TABS
75.0000 ug | ORAL_TABLET | Freq: Every day | ORAL | Status: DC
  Administered 2014-04-25 – 2014-04-26 (×2): 75 ug via ORAL
  Filled 2014-04-24 (×4): qty 1

## 2014-04-24 MED ORDER — ARTIFICIAL TEARS OP OINT
TOPICAL_OINTMENT | OPHTHALMIC | Status: AC
  Filled 2014-04-24: qty 3.5

## 2014-04-24 MED ORDER — MIDAZOLAM HCL 2 MG/2ML IJ SOLN
INTRAMUSCULAR | Status: AC
Start: 2014-04-24 — End: 2014-04-24
  Filled 2014-04-24: qty 2

## 2014-04-24 MED ORDER — PHENOL 1.4 % MT LIQD: 1.0000 | OROMUCOSAL | Status: DC | PRN

## 2014-04-24 MED ORDER — PHENYLEPHRINE HCL 10 MG/ML IJ SOLN
10.0000 mg | INTRAMUSCULAR | Status: DC | PRN
  Administered 2014-04-24: 50 ug/min via INTRAVENOUS

## 2014-04-24 MED ORDER — SODIUM CHLORIDE 0.9 % IV SOLN: 250.0000 mL | INTRAVENOUS | Status: DC

## 2014-04-24 MED ORDER — BUPIVACAINE LIPOSOME 1.3 % IJ SUSP
INTRAMUSCULAR | Status: DC | PRN
  Administered 2014-04-24: 20 mL

## 2014-04-24 MED ORDER — AMLODIPINE BESYLATE 10 MG PO TABS
10.0000 mg | ORAL_TABLET | Freq: Every day | ORAL | Status: DC
  Administered 2014-04-25 – 2014-04-26 (×2): 10 mg via ORAL
  Filled 2014-04-24 (×2): qty 1

## 2014-04-24 MED ORDER — PROPOFOL 10 MG/ML IV BOLUS
INTRAVENOUS | Status: DC | PRN
  Administered 2014-04-24: 200 mg via INTRAVENOUS

## 2014-04-24 MED ORDER — ACETAMINOPHEN 325 MG PO TABS: 650.0000 mg | ORAL_TABLET | ORAL | Status: DC | PRN

## 2014-04-24 MED ORDER — HYDROMORPHONE HCL 1 MG/ML IJ SOLN
0.2500 mg | INTRAMUSCULAR | Status: DC | PRN
  Administered 2014-04-24 (×2): 0.5 mg via INTRAVENOUS

## 2014-04-24 MED ORDER — BUPIVACAINE HCL (PF) 0.5 % IJ SOLN
INTRAMUSCULAR | Status: DC | PRN
  Administered 2014-04-24: 5 mL

## 2014-04-24 MED ORDER — PHENYLEPHRINE HCL 10 MG/ML IJ SOLN
INTRAMUSCULAR | Status: DC | PRN
  Administered 2014-04-24: 120 ug via INTRAVENOUS
  Administered 2014-04-24: 80 ug via INTRAVENOUS

## 2014-04-24 MED ORDER — BUPIVACAINE LIPOSOME 1.3 % IJ SUSP
20.0000 mL | Freq: Once | INTRAMUSCULAR | Status: DC
  Filled 2014-04-24: qty 20

## 2014-04-24 MED ORDER — LIDOCAINE-EPINEPHRINE 1 %-1:100000 IJ SOLN
INTRAMUSCULAR | Status: DC | PRN
  Administered 2014-04-24: 5 mL

## 2014-04-24 MED ORDER — ONDANSETRON HCL 4 MG/2ML IJ SOLN
INTRAMUSCULAR | Status: DC | PRN
  Administered 2014-04-24: 4 mg via INTRAVENOUS

## 2014-04-24 MED ORDER — INSULIN ASPART 100 UNIT/ML ~~LOC~~ SOLN
0.0000 [IU] | Freq: Every day | SUBCUTANEOUS | Status: DC
  Administered 2014-04-25: 5 [IU] via SUBCUTANEOUS

## 2014-04-24 MED ORDER — PANTOPRAZOLE SODIUM 40 MG PO TBEC
40.0000 mg | DELAYED_RELEASE_TABLET | Freq: Every day | ORAL | Status: DC
  Administered 2014-04-24 – 2014-04-25 (×2): 40 mg via ORAL
  Filled 2014-04-24: qty 1

## 2014-04-24 MED ORDER — PROPOFOL 10 MG/ML IV BOLUS
INTRAVENOUS | Status: AC
  Filled 2014-04-24: qty 20

## 2014-04-24 MED ORDER — PRIMIDONE 50 MG PO TABS
150.0000 mg | ORAL_TABLET | Freq: Two times a day (BID) | ORAL | Status: DC
  Administered 2014-04-24 – 2014-04-26 (×4): 150 mg via ORAL
  Filled 2014-04-24 (×5): qty 3

## 2014-04-24 MED ORDER — SERTRALINE HCL 100 MG PO TABS
100.0000 mg | ORAL_TABLET | Freq: Every day | ORAL | Status: DC
  Administered 2014-04-24 – 2014-04-25 (×2): 100 mg via ORAL
  Filled 2014-04-24 (×2): qty 1

## 2014-04-24 MED ORDER — LACTATED RINGERS IV SOLN
INTRAVENOUS | Status: DC
  Administered 2014-04-24: 11:00:00 via INTRAVENOUS

## 2014-04-24 MED ORDER — ROCURONIUM BROMIDE 50 MG/5ML IV SOLN
INTRAVENOUS | Status: AC
  Filled 2014-04-24: qty 1

## 2014-04-24 MED ORDER — HYDROCODONE-ACETAMINOPHEN 5-325 MG PO TABS
1.0000 | ORAL_TABLET | ORAL | Status: DC | PRN
  Administered 2014-04-24 – 2014-04-26 (×9): 2 via ORAL
  Filled 2014-04-24 (×9): qty 2

## 2014-04-24 MED ORDER — DOCUSATE SODIUM 100 MG PO CAPS
100.0000 mg | ORAL_CAPSULE | Freq: Two times a day (BID) | ORAL | Status: DC
  Administered 2014-04-24 – 2014-04-26 (×4): 100 mg via ORAL
  Filled 2014-04-24 (×4): qty 1

## 2014-04-24 MED ORDER — PROMETHAZINE HCL 25 MG/ML IJ SOLN: 6.2500 mg | INTRAMUSCULAR | Status: DC | PRN

## 2014-04-24 MED ORDER — 0.9 % SODIUM CHLORIDE (POUR BTL) OPTIME
TOPICAL | Status: DC | PRN
  Administered 2014-04-24: 1000 mL

## 2014-04-24 MED ORDER — SODIUM CHLORIDE 0.9 % IJ SOLN
3.0000 mL | Freq: Two times a day (BID) | INTRAMUSCULAR | Status: DC
  Administered 2014-04-25: 3 mL via INTRAVENOUS

## 2014-04-24 MED ORDER — FENTANYL CITRATE 0.05 MG/ML IJ SOLN
INTRAMUSCULAR | Status: DC | PRN
  Administered 2014-04-24 (×4): 50 ug via INTRAVENOUS
  Administered 2014-04-24: 100 ug via INTRAVENOUS

## 2014-04-24 MED ORDER — GABAPENTIN 300 MG PO CAPS
300.0000 mg | ORAL_CAPSULE | Freq: Two times a day (BID) | ORAL | Status: DC
  Administered 2014-04-24 – 2014-04-26 (×4): 300 mg via ORAL
  Filled 2014-04-24 (×4): qty 1

## 2014-04-24 MED ORDER — EPHEDRINE SULFATE 50 MG/ML IJ SOLN
INTRAMUSCULAR | Status: DC | PRN
  Administered 2014-04-24: 10 mg via INTRAVENOUS
  Administered 2014-04-24: 15 mg via INTRAVENOUS
  Administered 2014-04-24: 10 mg via INTRAVENOUS
  Administered 2014-04-24: 15 mg via INTRAVENOUS
  Administered 2014-04-24: 5 mg via INTRAVENOUS

## 2014-04-24 MED ORDER — FLEET ENEMA 7-19 GM/118ML RE ENEM: 1.0000 | ENEMA | Freq: Once | RECTAL | Status: AC | PRN

## 2014-04-24 MED ORDER — ALBUMIN HUMAN 5 % IV SOLN
INTRAVENOUS | Status: DC | PRN
  Administered 2014-04-24 (×2): via INTRAVENOUS

## 2014-04-24 MED ORDER — ACETAMINOPHEN 650 MG RE SUPP: 650.0000 mg | RECTAL | Status: DC | PRN

## 2014-04-24 MED ORDER — THROMBIN 20000 UNITS EX SOLR
CUTANEOUS | Status: DC | PRN
  Administered 2014-04-24: 16:00:00 via TOPICAL

## 2014-04-24 MED ORDER — ALUM & MAG HYDROXIDE-SIMETH 200-200-20 MG/5ML PO SUSP: 30.0000 mL | Freq: Four times a day (QID) | ORAL | Status: DC | PRN

## 2014-04-24 MED ORDER — ONDANSETRON HCL 4 MG/2ML IJ SOLN: 4.0000 mg | INTRAMUSCULAR | Status: DC | PRN

## 2014-04-24 MED ORDER — METHOCARBAMOL 1000 MG/10ML IJ SOLN
500.0000 mg | Freq: Four times a day (QID) | INTRAVENOUS | Status: DC | PRN
  Filled 2014-04-24: qty 5

## 2014-04-24 MED ORDER — PANTOPRAZOLE SODIUM 40 MG PO TBEC
40.0000 mg | DELAYED_RELEASE_TABLET | Freq: Every day | ORAL | Status: DC
  Filled 2014-04-24: qty 1

## 2014-04-24 MED ORDER — ASPIRIN EC 81 MG PO TBEC
81.0000 mg | DELAYED_RELEASE_TABLET | Freq: Every morning | ORAL | Status: DC
  Administered 2014-04-25 – 2014-04-26 (×2): 81 mg via ORAL
  Filled 2014-04-24 (×2): qty 1

## 2014-04-24 SURGICAL SUPPLY — 83 items
BENZOIN TINCTURE PRP APPL 2/3 (GAUZE/BANDAGES/DRESSINGS) ×2 IMPLANT
BLADE CLIPPER SURG (BLADE) IMPLANT
BONE MATRIX OSTEOCEL PRO MED (Bone Implant) ×2 IMPLANT
BUR MATCHSTICK NEURO 3.0 LAGG (BURR) ×2 IMPLANT
BUR ROUND FLUTED 5 RND (BURR) ×2 IMPLANT
CAGE PLIF 8X9X23-12 LUMBAR (Cage) ×4 IMPLANT
CANISTER SUCT 3000ML (MISCELLANEOUS) ×2 IMPLANT
CLIP NEUROVISION LG (CLIP) ×2 IMPLANT
CONT SPEC 4OZ CLIKSEAL STRL BL (MISCELLANEOUS) ×4 IMPLANT
COVER BACK TABLE 24X17X13 BIG (DRAPES) IMPLANT
COVER BACK TABLE 60X90IN (DRAPES) ×2 IMPLANT
DECANTER SPIKE VIAL GLASS SM (MISCELLANEOUS) ×2 IMPLANT
DRAPE C-ARM 42X72 X-RAY (DRAPES) ×2 IMPLANT
DRAPE C-ARMOR (DRAPES) ×2 IMPLANT
DRAPE LAPAROTOMY 100X72X124 (DRAPES) ×2 IMPLANT
DRAPE POUCH INSTRU U-SHP 10X18 (DRAPES) ×2 IMPLANT
DRAPE SURG 17X23 STRL (DRAPES) ×2 IMPLANT
DRSG OPSITE POSTOP 4X6 (GAUZE/BANDAGES/DRESSINGS) ×2 IMPLANT
DRSG TELFA 3X8 NADH (GAUZE/BANDAGES/DRESSINGS) IMPLANT
DURAPREP 26ML APPLICATOR (WOUND CARE) ×2 IMPLANT
ELECT BLADE 4.0 EZ CLEAN MEGAD (MISCELLANEOUS) ×2
ELECT REM PT RETURN 9FT ADLT (ELECTROSURGICAL) ×2
ELECTRODE BLDE 4.0 EZ CLN MEGD (MISCELLANEOUS) ×1 IMPLANT
ELECTRODE REM PT RTRN 9FT ADLT (ELECTROSURGICAL) ×1 IMPLANT
EVACUATOR 1/8 PVC DRAIN (DRAIN) IMPLANT
GAUZE SPONGE 4X4 12PLY STRL (GAUZE/BANDAGES/DRESSINGS) IMPLANT
GAUZE SPONGE 4X4 16PLY XRAY LF (GAUZE/BANDAGES/DRESSINGS) IMPLANT
GLOVE BIO SURGEON STRL SZ8 (GLOVE) ×2 IMPLANT
GLOVE BIOGEL PI IND STRL 7.5 (GLOVE) ×2 IMPLANT
GLOVE BIOGEL PI IND STRL 8 (GLOVE) ×1 IMPLANT
GLOVE BIOGEL PI IND STRL 8.5 (GLOVE) ×1 IMPLANT
GLOVE BIOGEL PI INDICATOR 7.5 (GLOVE) ×2
GLOVE BIOGEL PI INDICATOR 8 (GLOVE) ×1
GLOVE BIOGEL PI INDICATOR 8.5 (GLOVE) ×1
GLOVE ECLIPSE 8.0 STRL XLNG CF (GLOVE) ×4 IMPLANT
GLOVE EXAM NITRILE LRG STRL (GLOVE) IMPLANT
GLOVE EXAM NITRILE MD LF STRL (GLOVE) IMPLANT
GLOVE EXAM NITRILE XL STR (GLOVE) IMPLANT
GLOVE EXAM NITRILE XS STR PU (GLOVE) IMPLANT
GLOVE SURG SS PI 7.0 STRL IVOR (GLOVE) ×6 IMPLANT
GOWN STRL REUS W/ TWL LRG LVL3 (GOWN DISPOSABLE) IMPLANT
GOWN STRL REUS W/ TWL XL LVL3 (GOWN DISPOSABLE) ×3 IMPLANT
GOWN STRL REUS W/TWL 2XL LVL3 (GOWN DISPOSABLE) ×2 IMPLANT
GOWN STRL REUS W/TWL LRG LVL3 (GOWN DISPOSABLE)
GOWN STRL REUS W/TWL XL LVL3 (GOWN DISPOSABLE) ×3
KIT BASIN OR (CUSTOM PROCEDURE TRAY) ×2 IMPLANT
KIT NEEDLE NVM5 EMG ELECT (KITS) ×1 IMPLANT
KIT NEEDLE NVM5 EMG ELECTRODE (KITS) ×1
KIT POSITION SURG JACKSON T1 (MISCELLANEOUS) ×2 IMPLANT
KIT ROOM TURNOVER OR (KITS) ×2 IMPLANT
LIQUID BAND (GAUZE/BANDAGES/DRESSINGS) ×2 IMPLANT
MILL MEDIUM DISP (BLADE) ×2 IMPLANT
NEEDLE HYPO 21X1.5 SAFETY (NEEDLE) ×2 IMPLANT
NEEDLE HYPO 25X1 1.5 SAFETY (NEEDLE) ×2 IMPLANT
NEEDLE SPNL 18GX3.5 QUINCKE PK (NEEDLE) IMPLANT
NS IRRIG 1000ML POUR BTL (IV SOLUTION) ×2 IMPLANT
PACK LAMINECTOMY NEURO (CUSTOM PROCEDURE TRAY) ×2 IMPLANT
PAD ARMBOARD 7.5X6 YLW CONV (MISCELLANEOUS) ×6 IMPLANT
PATTIES SURGICAL .5 X.5 (GAUZE/BANDAGES/DRESSINGS) IMPLANT
PATTIES SURGICAL .5 X1 (DISPOSABLE) IMPLANT
PATTIES SURGICAL 1X1 (DISPOSABLE) IMPLANT
ROD 35MM (Rod) ×4 IMPLANT
SCREW LOCK (Screw) ×4 IMPLANT
SCREW LOCK FXNS SPNE MAS PL (Screw) ×4 IMPLANT
SCREW PLIF MAS 5.5X35 LUMBAR (Screw) ×4 IMPLANT
SCREW SHANKS 5.5X35 (Screw) ×4 IMPLANT
SCREW TULIP 5.5 (Screw) ×4 IMPLANT
SPONGE LAP 4X18 X RAY DECT (DISPOSABLE) IMPLANT
SPONGE SURGIFOAM ABS GEL 100 (HEMOSTASIS) ×2 IMPLANT
STAPLER SKIN PROX WIDE 3.9 (STAPLE) IMPLANT
STRIP CLOSURE SKIN 1/2X4 (GAUZE/BANDAGES/DRESSINGS) IMPLANT
SUT VIC AB 1 CT1 18XBRD ANBCTR (SUTURE) ×2 IMPLANT
SUT VIC AB 1 CT1 8-18 (SUTURE) ×2
SUT VIC AB 2-0 CT1 18 (SUTURE) ×4 IMPLANT
SUT VIC AB 3-0 SH 8-18 (SUTURE) ×4 IMPLANT
SYR 20CC LL (SYRINGE) ×2 IMPLANT
SYR 20ML ECCENTRIC (SYRINGE) ×2 IMPLANT
SYR 5ML LL (SYRINGE) IMPLANT
TOWEL OR 17X24 6PK STRL BLUE (TOWEL DISPOSABLE) ×2 IMPLANT
TOWEL OR 17X26 10 PK STRL BLUE (TOWEL DISPOSABLE) ×2 IMPLANT
TRAP SPECIMEN MUCOUS 40CC (MISCELLANEOUS) ×2 IMPLANT
TRAY FOLEY CATH 14FRSI W/METER (CATHETERS) ×2 IMPLANT
WATER STERILE IRR 1000ML POUR (IV SOLUTION) ×2 IMPLANT

## 2014-04-24 NOTE — Anesthesia Postprocedure Evaluation (Signed)
  Anesthesia Post-op Note  Patient: [name]  Procedure(s) Performed: Procedure(s) with comments: Lumbar four-five Maximum access Surgery posterior lumbar interbody fusion (N/A) - Lumbar four-five Maximum access Surgery posterior lumbar interbody fusion  Patient Location: PACU  Anesthesia Type:General  Level of Consciousness: awake, alert  and oriented  Airway and Oxygen Therapy: Patient Spontanous Breathing and Patient connected to nasal cannula oxygen  Post-op Pain: mild  Post-op Assessment: Post-op Vital signs reviewed, Patient's Cardiovascular Status Stable, Respiratory Function Stable, Patent Airway and Pain level controlled  Post-op Vital Signs: stable  Last Vitals:  Filed Vitals:   04/24/14 1806  BP:   Pulse:   Temp: 37 C  Resp:     Complications: No apparent anesthesia complications

## 2014-04-24 NOTE — H&P (Signed)
[address] [place], [place] [address] Phone: [phone]   Patient ID:   [id number] Patient: [name]  Date of Birth: 12/24/1950 Visit Type: Office Visit   Date: 04/18/2014 09:00 AM Provider: Marchia Meiers. Vertell Limber MD   This 64 year old female presents for back pain.  History of Present Illness: 1.  back pain  Pt returns for review of CT & discussion of surgical options.  She notes falls continue as left leg gives way.   Gabapentin 123m iii BID  CT on [place]  The patient fell in early December and went to an event [facility] where a CT scan of the lumbar spine was obtained and also plain x-rays of her right hand.  The CT scan did not show any major new pathology in the lumbar spine but did comment on persistent changes at the L4-L5 level.  The hand x-rays were negative.  Patient continues to have falls at home and is currently in a wheelchair.  I reviewed her imaging studies and her old MRI and her physical examination today.  She complains that her left leg continues to give way.  She appears to have left hip flexor weakness however on examination the patient is able to stand and walk about the examining room and is able to stand on her right leg with support although she is quite unsteady and is able to lift her left leg.  She does have significant quadriceps weakness on the left.  I believe this relates to spondylosis and stenosis with foraminal stenosis at L4 L5 on the left.  In addition she has stenosis at L4 L5 causing L5 radicular component but her predominant complaint relates to the L4 nerve root compression.  She does have quadriceps weakness on the left.  This is interfering with ability to stand and function.  Her tremor appears to be relatively stable and her deep brain stimulators are functioning properly.  She denies any left arm symptoms whatsoever.      Medical/Surgical/Interim History Reviewed, no change.  Last detailed document  date:09/18/2013.   PAST MEDICAL HISTORY, SURGICAL HISTORY, FAMILY HISTORY, SOCIAL HISTORY AND REVIEW OF SYSTEMS I have reviewed the patient's past medical, surgical, family and social history as well as the comprehensive review of systems as included on the [facility] history form dated 09/18/2013, which I have signed.  Family History: Reviewed, no changes.  Last detailed document: 09/18/2013.   Social History: Tobacco use reviewed. Reviewed, no changes. Last detailed document date: 09/18/2013.      MEDICATIONS(added, continued or stopped this visit):   Started Medication Directions Instruction Stopped   Advair HFA 115 mcg-21 mcg/actuation aerosol inhaler inhale 2 puff by inhalation route 2 times every day in the morning and evening     albuterol sulfate 2.5 mg/3 mL (0.083 %) solution for nebulization inhale 3 milliliters by nebulization route  every 6 hours as needed for wheezing or shortness of breath     aspirin 81 mg tablet,delayed release take 1 tablet by oral route  every day     Benicar 40 mg tablet take 1 tablet by oral route  every day     cyclobenzaprine 10 mg tablet take 0.5 tablet by oral route 3 times every day as needed     diphenhydramine 25 mg tablet take 1 tablet by oral route  every day at bedtime as needed     duloxetine 60 mg capsule,delayed release take 1 capsule by oral route  every  day    03/26/2014 gabapentin 100 mg capsule take 3 capsule by oral route  BID     Humulin N 100 unit/mL subcutaneous suspension Inject 90 units each morning and 20 units in the evening    01/17/2014 Keflex 500 mg capsule take 1 capsule by oral route 3 times every day for 10 days     levothyroxine 75 mcg tablet take 1 tablet by oral route  every day     lorazepam 1 mg tablet take 1 tablet by oral route 3 times every day as needed     metoprolol succinate ER 50 mg tablet,extended release 24 hr take 1 tablet by oral route 2 times every day     naproxen 500 mg  tablet Take as directed     Nasonex 50 mcg/actuation Spray spray 2 spray by intranasal route  every day in each nostril     Norvasc take 1 tablet by oral route  every day     primidone 50 mg tablet take 3 tablet by oral route 2 times every day     ProAir HFA 90 mcg/actuation aerosol inhaler inhale 2 puff by inhalation route  every 6 hours as needed     promethazine-codeine 6.25 mg-10 mg/5 mL syrup take 5 milliliter by oral route 2 times every day as needed for cough     ranitidine 150 mg tablet take 1 tablet by oral route  every day     sertraline 100 mg tablet take 1.5 tablets by oral route  every day      ALLERGIES:  Ingredient Reaction Medication Name Comment  LOSARTAN Anaphylaxis    Reviewed, no changes.    Vitals Date Temp F BP Pulse Ht In Wt Lb BMI BSA Pain Score  04/18/2014  148/91 108 64.5 207 34.98  8/10        IMPRESSION Based on the patient's persistent left leg weakness which actually appears to be worsening and the significant left L4 nerve root compression in the foramen along with severe stenosis and spondylosis at this level I recommend proceeding with L4 L5 decompression and fusion.  She has some degenerative changes at L3 L4 and L5-S1 levels but neither of these appear to be actively symptomatic and I do not think these need to be addressed.  I do not believe that she has true hip flexor weakness which is related to another level in her spine and believe that the majority of her symptoms related to the L4 L5 level.  I do think she'll require some rehabilitation afterwards in terms of her general deconditioning and multiple medical problems.  Completed Orders (this encounter) Order Details Reason Side Interpretation Result Initial Treatment Date Region  Hypertension education Follow up with primary care physician.        Lifestyle education regarding diet Encouraged to eat a well balanced diet and follow up with primary care physician.         Assessment/Plan #  Detail Type Description   1. Assessment Spinal stenosis of lumbar region (M48.06).       2. Assessment Radiculopathy, lumbar region (M54.16).       3. Assessment Low back pain, unspecified back pain laterality, with sciatica presence unspecified (M54.5).       4. Assessment Scoliosis (and kyphoscoliosis), idiopathic (M41.20).       5. Assessment Essential (primary) hypertension (I10).       6. Assessment Body mass index (BMI) 34.0-34.9, adult (Z68.34).   Plan Orders Today's instructions / counseling  include(s) Lifestyle education regarding diet.         Pain Assessment/Treatment Pain Scale: 8/10. Method: Numeric Pain Intensity Scale. Location: back. Onset: 10/17/2013. Duration: varies. Quality: discomforting. Pain Assessment/Treatment follow-up plan of care: Patient is taking medications as prescribed..  Fall Risk Plan The Patient has fallen 2 times in the last year.  Falls risk follow-up plan of care: Assisted devices: Advised patient to use handrails..  Patient wishes to proceed with L4 L5 and MAS PLIF for left leg weakness including quadriceps weakness.  Patient understands risks and benefits of surgery and wishes to proceed.  This is scheduled for 04/24/14.  She is fitted for LSO brace today.  Preoperative nursing teaching was performed.  Orders: Diagnostic Procedures: Assessment Procedure  M48.06 MAS PLIF L4-5  Instruction(s)/Education: Assessment Instruction  I10 Hypertension education  Z68.34 Lifestyle education regarding diet             Provider:  Marchia Meiers. Vertell Limber MD  04/18/2014 01:53 PM Dictation edited by: Marchia Meiers. Vertell Limber    CC Providers: [name] [address] [place], [place] [address]         ----------------------------------------------------------------------------------------------------------------------------------------------------------------------         Electronically signed by Marchia Meiers Vertell Limber MD on 04/22/2014 05:19  PM  > [address] [place], [place] [address] Phone: [phone]   Patient ID:   [id number] Patient: [name]  Date of Birth: [birth year] Visit Type: Office Visit   Date: 09/18/2013 11:30 AM Provider: Marchia Meiers. Vertell Limber MD   This 64 year old female presents for Tremor.  History of Present Illness: 1.  Tremor  Patient is a delightful 64 year old woman who works as an Engineering geologist at [employer] with comorbidities of GERD, insulin-dependent diabetes mellitus and asthma who has been diagnosed with essential tremor since the 1960s.  She says her mother had this as well as did her father.  She has problems with tremulousness of speech and head tremor as well as right greater than left upper extremity tremor.  The patient says that the tremor is now quite disabling to her and is interfering with her ability to function.  She has visited with Dr. Kingsley Callander Occasions.  She Is Felt to Be a Good Candidate for VIM Deep Brain Stimulation.  The patient wishes to go ahead with bilateral stimulator placement but if can only be approved on a unilateral basis she would like it done on the left for right handed symptoms.  She is reviewed EVD and met with the [employer] representative and has decided she wishes a good with surgery and comes to discuss this in detail and review the surgical procedure with me.        PAST MEDICAL/SURGICAL HISTORY   (Detailed)  Disease/disorder Onset Date Management Date Comments    Cholecystectomy 1991     Partial hysterectomy      C-section    Anxiety      Arthritis      Asthma      Depression      Diabetes mellitus      Thyroid disease          PAST MEDICAL HISTORY, SURGICAL HISTORY, FAMILY HISTORY, SOCIAL HISTORY AND REVIEW OF SYSTEMS I have reviewed the patient's past medical, surgical, family and social history as well as the comprehensive review of systems as included on the [facility] history form dated  09/18/2013, which I have signed.  Family History  (Detailed)  Relationship Family Member Name Deceased  Age at Death Condition Onset Age Cause of Death      Family history of Diabetes mellitus  N   SOCIAL HISTORY  (Detailed) Tobacco use reviewed. Preferred language is Unknown.   Smoking status: Former smoker.  SMOKING STATUS Use Status Type Smoking Status Usage Per Day Years Used Total Pack Years  yes  Former smoker       HOME ENVIRONMENT/SAFETY The patient has not fallen in the last year.        MEDICATIONS(added, continued or stopped this visit):   Started Medication Directions Instruction Stopped   Advair HFA 115 mcg-21 mcg/actuation aerosol inhaler inhale 2 puff by inhalation route 2 times every day in the morning and evening     albuterol sulfate 2.5 mg/3 mL (0.083 %) solution for nebulization inhale 3 milliliters by nebulization route  every 6 hours as needed for wheezing or shortness of breath     aspirin 81 mg tablet,delayed release take 1 tablet by oral route  every day     Benicar 40 mg tablet take 1 tablet by oral route  every day     cyclobenzaprine 10 mg tablet take 0.5 tablet by oral route 3 times every day as needed     diphenhydramine 25 mg tablet take 1 tablet by oral route  every day at bedtime as needed     duloxetine 60 mg capsule,delayed release take 1 capsule by oral route  every day     Humulin N 100 unit/mL subcutaneous suspension Inject 90 units each morning and 20 units in the evening     levothyroxine 75 mcg tablet take 1 tablet by oral route  every day     lorazepam 1 mg tablet take 1 tablet by oral route 3 times every day as needed     metoprolol succinate ER 50 mg tablet,extended release 24 hr take 1 tablet by oral route 2 times every day     naproxen 500 mg tablet Take as directed     Nasonex 50 mcg/actuation Spray spray 2 spray by intranasal route  every day in each nostril     primidone 50 mg tablet take 3 tablet by oral route 2 times every  day     ProAir HFA 90 mcg/actuation aerosol inhaler inhale 2 puff by inhalation route  every 6 hours as needed     promethazine-codeine 6.25 mg-10 mg/5 mL syrup take 5 milliliter by oral route 2 times every day as needed for cough     ranitidine 150 mg tablet take 1 tablet by oral route  every day     sertraline 100 mg tablet take 1.5 tablets by oral route  every day      ALLERGIES:   Vitals Date Temp F BP Pulse Ht In Wt Lb BMI BSA Pain Score  09/18/2013  134/81 92 64.5 193 32.62       PHYSICAL EXAM General Level of Distress: no acute distress Overall Appearance: normal  Head and Face  Right Left  Fundoscopic Exam:  normal normal    Cardiovascular Cardiac: regular rate and rhythm without murmur  Right Left  Carotid Pulses: normal normal  Respiratory Lungs: clear to auscultation  Neurological Orientation: normal Recent and Remote Memory: normal Attention Span and Concentration:   normal Language: normal Fund of Knowledge: normal  Right Left Sensation: normal normal Upper Extremity Coordination: normal normal  Lower Extremity Coordination: normal normal  Musculoskeletal Gait and Station: normal  Right Left Upper Extremity Muscle Strength: normal normal Lower  Extremity Muscle Strength: normal normal Upper Extremity Muscle Tone:  normal normal Lower Extremity Muscle Tone: normal normal  Motor Strength Upper and lower extremity motor strength was tested in the clinically pertinent muscles.     Deep Tendon Reflexes  Right Left Biceps: normal normal Triceps: normal normal Brachiloradialis: normal normal Patellar: normal normal Achilles: normal normal  Cranial Nerves II. Optic Nerve/Visual Fields: normal III. Oculomotor: normal IV. Trochlear: normal V. Trigeminal: normal VI. Abducens: normal VII. Facial: normal VIII. Acoustic/Vestibular: normal IX. Glossopharyngeal: normal X. Vagus: normal XI. Spinal Accessory: normal XII.  Hypoglossal: normal  Motor and other Tests Lhermittes: negative Rhomberg: negative Pronator drift: absent     Right Left Hoffman's: normal normal Clonus: normal normal Babinski: normal normal   Additional Findings:  Patient has significant tremor both of her head as well as tremulous speech and right greater than left upper extremity tremors per patient although to my examination the left upper extremity appears to be more affected today than the right.  She says generally it is much more bothersome on the right than the left.  Her writing was assessed and is clearly affected by tremor.      IMPRESSION Severe bilateral upper extremity greater than lower extremity tremor due to essential tremor.  Patient wishes to proceed with bilateral VIM DBS placement for tremor.  Completed Orders (this encounter) Order Details Reason Side Interpretation Result Initial Treatment Date Region  Lifestyle education regarding diet Encouraged to eat a well balanced diet and follow up with primary care physician.         Assessment/Plan # Detail Type Description   1. Assessment Tremor (781.0).       2. Assessment Diabetes (250.00).       3. Assessment BMI 32.0-32.9,ADULT (V85.32).   Plan Orders Today's instructions / counseling include(s) Lifestyle education regarding diet.         Fall Risk Plan The patient has not fallen in the last year.  Proceed with Star fix fiducial placement in preparation for bilateral VIM DBS surgery.  Risks and benefits were discussed in detail with the patient and she wishes to proceed  Orders: Instruction(s)/Education: Assessment Instruction  V85.32 Lifestyle education regarding diet             Provider:  Marchia Meiers. Vertell Limber MD  09/24/2013 03:13 PM Dictation edited by: Marchia Meiers. Vertell Limber    CC Providers: [name] [place], [place]  [address] ----------------------------------------------------------------------------------------------------------------------------------------------------------------------         Electronically signed by Marchia Meiers Vertell Limber MD on 09/24/2013 03:13 PM

## 2014-04-24 NOTE — Interval H&P Note (Signed)
History and Physical Interval Note:  04/24/2014 10:00 AM  [name]  has presented today for surgery, with the diagnosis of Spinal stenosis of lumbar region, Scoliosis, Lumbar radiculopathy, Low back pain  The various methods of treatment have been discussed with the patient and family. After consideration of risks, benefits and other options for treatment, the patient has consented to  Procedure(s) with comments: L4-5 Maximum access posterior lumbar interbody fusion (N/A) - L4-5 Maximum access posterior lumbar interbody fusion as a surgical intervention .  The patient's history has been reviewed, patient examined, no change in status, stable for surgery.  I have reviewed the patient's chart and labs.  Questions were answered to the patient's satisfaction.     [name]

## 2014-04-24 NOTE — Op Note (Signed)
04/24/2014  4:48 PM  PATIENT:  [name]  64 y.o. female  PRE-OPERATIVE DIAGNOSIS:  Spinal stenosis of lumbar region, Scoliosis, Lumbar radiculopathy, Low back pain L 45 level  POST-OPERATIVE DIAGNOSIS:  Spinal stenosis of lumbar region, Scoliosis, Lumbar radiculopathy, Low back pain L 45 level  PROCEDURE:  Procedure(s) with comments: Lumbar four-five Maximum access Surgery posterior lumbar interbody fusion (N/A) - Lumbar four-five Maximum access Surgery posterior lumbar interbody fusion with PEK Cages (Decompression greater than for standard PLIF procedure) with pedicle screw fixation and posterolateral arthrodesis with local autograft/allograft  SURGEON:  Surgeon(s) and Role:    * [name], MD - Primary  PHYSICIAN ASSISTANT: Kritzer, MD  ASSISTANTS: Poteat, RN   ANESTHESIA:   general  EBL:  Total I/O In: 1500 [I.V.:1000; IV Piggyback:500] Out: 75 [Urine:75]  BLOOD ADMINISTERED:none  DRAINS: none   LOCAL MEDICATIONS USED:  MARCAINE     SPECIMEN:  No Specimen  DISPOSITION OF SPECIMEN:  N/A  COUNTS:  YES  TOURNIQUET:  * No tourniquets in log *  DICTATION: Patient is a 64 year old with spondylosis , stenosis, scoliosis,  severe back and left lower extremity pain and weakness at L4/5 levels of the lumbar spine. It was elected to take her to surgery for MASPLIF L 45 level with posterolateral arthrodesis.  Procedure:   Following uncomplicated induction of GETA, and placement of electrodes for neural monitoring, patient was turned into a prone position on the Grant tableand using AP  fluoroscopy the area of planned incision was marked, prepped with betadine scrub and Duraprep, then draped. Exposure was performed of facet joint complex at L 45 level and the MAS retractor was placed.5.5 x 35 mm cortical Nuvasive screws were placed at L 4 bilaterally according to standard landmarks using neural monitoring.  A total laminectomy of L 4 was then performed with  disarticulation of facets.  This bone was saved for grafting, combined with Osteocel after being run through bone mill and was placed in bone packing device.  Thorough discectomy was performed bilaterally at L 45 and the endplates were prepared for grafting.  Decompression was performed of all neural elements, particularly of the L 4 nerve root on the left.  Decompression was greater than typically performed in standard PLIF procedure.  23 x 8 x 12 degree cages were placed in the interspace and positioning was confirmed with AP and lateral fluoroscopy.  10 cc of autograft/Osteocel was packed in the interspace medial to the second cage.   Remaining screws were placed at L 5 (5.5 x 35 mm) and 35 mm rods were placed.   And the screws were locked and torqued.Final Xrays showed well positioned implants and screw fixation. The posterolateral region was packed with remaining 10 cc of autograft on the right of midline. The wounds were irrigated and then closed with 1, 2-0 and 3-0 Vicryl stitches. Long-acting marcaine was instilled in the subcutaneous tissues. Sterile occlusive dressing was placed with Dermabond. The patient was then extubated in the operating room and taken to recovery in stable and satisfactory condition having tolerated her operation well. Counts were correct at the end of the case.  PLAN OF CARE: Admit to inpatient   PATIENT DISPOSITION:  PACU - hemodynamically stable.   Delay start of Pharmacological VTE agent (>24hrs) due to surgical blood loss or risk of bleeding: yes

## 2014-04-24 NOTE — Transfer of Care (Signed)
Immediate Anesthesia Transfer of Care Note  Patient: [name]  Procedure(s) Performed: Procedure(s) with comments: Lumbar four-five Maximum access Surgery posterior lumbar interbody fusion (N/A) - Lumbar four-five Maximum access Surgery posterior lumbar interbody fusion  Patient Location: PACU  Anesthesia Type:General  Level of Consciousness: awake, alert  and oriented  Airway & Oxygen Therapy: Patient Spontanous Breathing  Post-op Assessment: Report given to PACU RN  Post vital signs: Reviewed and stable  Complications: No apparent anesthesia complications

## 2014-04-24 NOTE — Anesthesia Preprocedure Evaluation (Addendum)
Anesthesia Evaluation  Patient identified by MRN, date of birth, ID band Patient awake    Reviewed: Allergy & Precautions, NPO status   History of Anesthesia Complications Negative for: history of anesthetic complications  Airway Mallampati: II   Neck ROM: Full    Dental  (+) Teeth Intact, Caps, Dental Advidsory Given   Pulmonary asthma , sleep apnea , former smoker,  breath sounds clear to auscultation        Cardiovascular hypertension, Rhythm:Regular Rate:Normal     Neuro/Psych Seizures -,   Neuromuscular disease    GI/Hepatic GERD-  ,  Endo/Other  diabetesHypothyroidism Morbid obesity  Renal/GU      Musculoskeletal  (+) Arthritis -,   Abdominal (+) + obese,   Peds  Hematology   Anesthesia Other Findings   Reproductive/Obstetrics                            Anesthesia Physical Anesthesia Plan  ASA: III  Anesthesia Plan: General   Post-op Pain Management:    Induction: Intravenous  Airway Management Planned: Oral ETT  Additional Equipment:   Intra-op Plan:   Post-operative Plan: Extubation in OR  Informed Consent: I have reviewed the patients History and Physical, chart, labs and discussed the procedure including the risks, benefits and alternatives for the proposed anesthesia with the patient or authorized representative who has indicated his/her understanding and acceptance.   Dental advisory given and Dental Advisory Given  Plan Discussed with: CRNA, Surgeon and Anesthesiologist  Anesthesia Plan Comments:        Anesthesia Quick Evaluation

## 2014-04-24 NOTE — Brief Op Note (Signed)
04/24/2014  4:48 PM  PATIENT:  [name]  64 y.o. female  PRE-OPERATIVE DIAGNOSIS:  Spinal stenosis of lumbar region, Scoliosis, Lumbar radiculopathy, Low back pain L 45 level  POST-OPERATIVE DIAGNOSIS:  Spinal stenosis of lumbar region, Scoliosis, Lumbar radiculopathy, Low back pain L 45 level  PROCEDURE:  Procedure(s) with comments: Lumbar four-five Maximum access Surgery posterior lumbar interbody fusion (N/A) - Lumbar four-five Maximum access Surgery posterior lumbar interbody fusion with PEK Cages (Decompression greater than for standard PLIF procedure) with pedicle screw fixation and posterolateral arthrodesis with local autograft/allograft  SURGEON:  Surgeon(s) and Role:    * [name], MD - Primary  PHYSICIAN ASSISTANT: Kritzer, MD  ASSISTANTS: Poteat, RN   ANESTHESIA:   general  EBL:  Total I/O In: 1500 [I.V.:1000; IV Piggyback:500] Out: 75 [Urine:75]  BLOOD ADMINISTERED:none  DRAINS: none   LOCAL MEDICATIONS USED:  MARCAINE     SPECIMEN:  No Specimen  DISPOSITION OF SPECIMEN:  N/A  COUNTS:  YES  TOURNIQUET:  * No tourniquets in log *  DICTATION: Patient is a 64 year old with spondylosis , stenosis, scoliosis,  severe back and left lower extremity pain and weakness at L4/5 levels of the lumbar spine. It was elected to take her to surgery for MASPLIF L 45 level with posterolateral arthrodesis.  Procedure:   Following uncomplicated induction of GETA, and placement of electrodes for neural monitoring, patient was turned into a prone position on the [place] tableand using AP  fluoroscopy the area of planned incision was marked, prepped with betadine scrub and Duraprep, then draped. Exposure was performed of facet joint complex at L 45 level and the MAS retractor was placed.5.5 x 35 mm cortical Nuvasive screws were placed at L 4 bilaterally according to standard landmarks using neural monitoring.  A total laminectomy of L 4 was then performed with  disarticulation of facets.  This bone was saved for grafting, combined with Osteocel after being run through bone mill and was placed in bone packing device.  Thorough discectomy was performed bilaterally at L 45 and the endplates were prepared for grafting.  Decompression was performed of all neural elements, particularly of the L 4 nerve root on the left.  Decompression was greater than typically performed in standard PLIF procedure.  23 x 8 x 12 degree cages were placed in the interspace and positioning was confirmed with AP and lateral fluoroscopy.  10 cc of autograft/Osteocel was packed in the interspace medial to the second cage.   Remaining screws were placed at L 5 (5.5 x 35 mm) and 35 mm rods were placed.   And the screws were locked and torqued.Final Xrays showed well positioned implants and screw fixation. The posterolateral region was packed with remaining 10 cc of autograft on the right of midline. The wounds were irrigated and then closed with 1, 2-0 and 3-0 Vicryl stitches. Long-acting marcaine was instilled in the subcutaneous tissues. Sterile occlusive dressing was placed with Dermabond. The patient was then extubated in the operating room and taken to recovery in stable and satisfactory condition having tolerated her operation well. Counts were correct at the end of the case.  PLAN OF CARE: Admit to inpatient   PATIENT DISPOSITION:  PACU - hemodynamically stable.   Delay start of Pharmacological VTE agent (>24hrs) due to surgical blood loss or risk of bleeding: yes

## 2014-04-24 NOTE — Anesthesia Procedure Notes (Signed)
Procedure Name: Intubation Date/Time: 04/24/2014 2:18 PM Performed by: Neldon Newport Pre-anesthesia Checklist: Patient identified, Timeout performed, Emergency Drugs available, Suction available and Patient being monitored Patient Re-evaluated:Patient Re-evaluated prior to inductionOxygen Delivery Method: Circle system utilized Preoxygenation: Pre-oxygenation with 100% oxygen Intubation Type: IV induction and Rapid sequence Ventilation: Mask ventilation without difficulty Laryngoscope Size: Mac and 3 Grade View: Grade I Tube type: Oral Tube size: 7.0 mm Number of attempts: 1 Placement Confirmation: positive ETCO2,  ETT inserted through vocal cords under direct vision and breath sounds checked- equal and bilateral Secured at: 22 cm Tube secured with: Tape Dental Injury: Teeth and Oropharynx as per pre-operative assessment

## 2014-04-24 NOTE — Progress Notes (Signed)
Awake, alert, conversant.  Stable exam.  Still weak left quadricep.

## 2014-04-25 ENCOUNTER — Encounter (HOSPITAL_COMMUNITY): Payer: Self-pay | Admitting: Neurosurgery

## 2014-04-25 LAB — GLUCOSE, CAPILLARY
Glucose-Capillary: 246 mg/dL — ABNORMAL HIGH (ref 70–99)
Glucose-Capillary: 296 mg/dL — ABNORMAL HIGH (ref 70–99)
Glucose-Capillary: 213 mg/dL — ABNORMAL HIGH (ref 70–99)
Glucose-Capillary: 382 mg/dL — ABNORMAL HIGH (ref 70–99)

## 2014-04-25 NOTE — Evaluation (Signed)
Occupational Therapy Evaluation Patient Details Name: [name] MRN: [id number] DOB: 06-Dec-1950 Today's Date: 04/25/2014    History of Present Illness s/p Lumbar four-five Maximum access Surgery posterior lumbar interbody fusion   Clinical Impression   Patient required assistance with LB ADLs and functional mobility PTA secondary to LLE weakness and back pain. Patient currently functioning at up to a total assist level for LB ADLs and min assist for functional mobility/transfers. Patient will benefit from acute OT to increase overall independence in the areas of ADLs, functional mobility, education on back precautions, education on donning/doffing lumbar corset, overall safety using AD/AE/DME in order to safely discharge home with husband.     Follow Up Recommendations  Home health OT;Supervision/Assistance - 24 hour    Equipment Recommendations  None recommended by OT    Recommendations for Other Services  None at this time     Precautions / Restrictions Precautions Precautions: Back;Fall Precaution Comments: Educarted patient on no BAT and handout given Restrictions Weight Bearing Restrictions: No      Mobility Bed Mobility Overal bed mobility: Needs Assistance Bed Mobility: Supine to Sit     Supine to sit: HOB elevated;Min assist     General bed mobility comments: using bed rails  Transfers Overall transfer level: Needs assistance Equipment used: Rolling walker (2 wheeled) Transfers: Sit to/from [facility] Sit to Stand: Min assist Stand pivot transfers: Min assist       General transfer comment: cues for correct hand placement and to maintain back precautions    Balance Overall balance assessment: Needs assistance Sitting-balance support: Single extremity supported Sitting balance-Leahy Scale: Fair     Standing balance support: Bilateral upper extremity supported Standing balance-Leahy Scale: Fair     ADL Overall ADL's : Needs  assistance/impaired Eating/Feeding: Independent   Grooming: Sitting;Set up   Upper Body Bathing: Set up;Sitting;Supervision/ safety   Lower Body Bathing: Moderate assistance;Sit to/from stand;Cueing for safety;Cueing for back precautions   Upper Body Dressing : Set up;Sitting;Supervision/safety   Lower Body Dressing: Total assistance;Sit to/from stand;Cueing for safety;Adhering to back precautions   Toilet Transfer: Minimal assistance;BSC;RW;Cueing for safety   Toileting- Clothing Manipulation and Hygiene: Min guard;Sit to/from stand;Cueing for back precautions;Cueing for safety       Functional mobility during ADLs: Minimal assistance;Cueing for safety;Rolling walker General ADL Comments: Educated patient on back precautions in functional setting. Patient with tremors throughout [place], but stated this has improved since September. Patient requried assistance with LB ADLs PTA secondary to pain. Educated patient on use of AE to increase independence with this. Patient limited by pain and has a history of falls secondary to LLE weakness and decreased overall balance.                Pertinent Vitals/Pain Pain Assessment: 0-10 Pain Score: 8  Pain Location: low back Pain Descriptors / Indicators: Aching Pain Intervention(s): Monitored during session;RN gave pain meds during session;Repositioned     Hand Dominance Right   Extremity/Trunk Assessment Upper Extremity Assessment Upper Extremity Assessment: Generalized weakness   Lower Extremity Assessment Lower Extremity Assessment: Defer to PT evaluation       Communication Communication Communication: No difficulties   Cognition Arousal/Alertness: Awake/alert Behavior During Therapy: WFL for tasks assessed/performed Overall Cognitive Status: Within Functional Limits for tasks assessed             Home Living Family/patient expects to be discharged to:: Private residence Living Arrangements: Spouse/significant  other Available Help at Discharge: Family;Available 24 hours/day Type of Home: Apartment  Home Access: Stairs to enter Entrance Stairs-Number of Steps: 2, 1 to side walk & 1 to porch; patient reports landlord has built ramps Entrance Stairs-Rails: None Home Layout: One level     Bathroom Shower/Tub: Tub/shower unit;Curtain (clawfoot tub)   Bathroom Toilet: Standard Bathroom Accessibility: Yes   Home Equipment: Environmental consultant - 2 wheels;Walker - 4 wheels;Bedside commode;Wheelchair - manual          Prior Functioning/Environment Level of Independence: Needs assistance  Gait / Transfers Assistance Needed: Patient reports husband assisted with w/c transfers and occasionally assist with in/out of bed since November ADL's / Homemaking Assistance Needed: Patient reports her husband assists with transfer in/out tub/shower, donning/doffing socks & shoes since November   Comments: spouse does the cooking and cleaning    OT Diagnosis: Generalized weakness;Acute pain   OT Problem List: Decreased strength;Decreased activity tolerance;Impaired balance (sitting and/or standing);Decreased coordination;Decreased safety awareness;Decreased knowledge of use of DME or AE;Decreased knowledge of precautions;Pain   OT Treatment/Interventions: Self-care/ADL training;Energy conservation;DME and/or AE instruction;Therapeutic activities;Patient/family education;Balance training    OT Goals(Current goals can be found in the care plan section) Acute Rehab OT Goals Patient Stated Goal: go home OT Goal Formulation: With patient Time For Goal Achievement: 05/09/14 Potential to Achieve Goals: Good ADL Goals Pt Will Perform Lower Body Bathing: with adaptive equipment;sit to/from stand;with supervision Pt Will Perform Lower Body Dressing: with min assist;with adaptive equipment;sit to/from stand Pt Will Transfer to Toilet: with supervision;ambulating Pt Will Perform Toileting - Clothing Manipulation and hygiene: with  supervision;sit to/from stand Additional ADL Goal #1: Patient will adhere to 3/3 back precautions during ADL and functional mobility/transfers independently  Additional ADL Goal #2: Patient will don/doff brace independently in prep for ADL and functional mobility/transfers  OT Frequency: Min 2X/week   Barriers to D/C: None known at this time          End of Session Equipment Utilized During Treatment: Rolling walker;Back brace Nurse Communication: Mobility status  Activity Tolerance: Patient tolerated treatment well Patient left: in chair;with call bell/phone within reach;with nursing/sitter in room   Time: 0813-0851 OT Time Calculation (min): 38 min Charges:  OT General Charges $OT Visit: 1 Procedure OT Evaluation $Initial OT Evaluation Tier I: 1 Procedure OT Treatments $Self Care/Home Management : 23-37 mins  Larkyn Greenberger , MS, OTR/L, CLT Pager: [phone]  04/25/2014, 9:00 AM

## 2014-04-25 NOTE — Progress Notes (Signed)
UR complete.  Shan Valdes RN, MSN 

## 2014-04-25 NOTE — Evaluation (Signed)
Physical Therapy Evaluation Patient Details Name: [name] MRN: [id number] DOB: [birth year] Today's Date: 04/25/2014   History of Present Illness  s/p Lumbar four-five Maximum access Surgery posterior lumbar interbody fusion  Clinical Impression  Patient is s/p above surgery resulting in functional limitations due to the deficits listed below (see PT Problem List). Pt requiring min A for mobility currently, ambulated 57' with RW, left in SL in bed. Patient will benefit from skilled PT to increase their independence and safety with mobility to allow discharge to the venue listed below.       Follow Up Recommendations Home health PT;Supervision for mobility/OOB    Equipment Recommendations  None recommended by PT    Recommendations for Other Services       Precautions / Restrictions Precautions Precautions: Back;Fall Precaution Booklet Issued: Yes (comment) Precaution Comments: reviewed precautions and proper posture Required Braces or Orthoses: Spinal Brace Spinal Brace: Lumbar corset;Applied in sitting position Restrictions Weight Bearing Restrictions: No      Mobility  Bed Mobility Overal bed mobility: Needs Assistance Bed Mobility: Sit to Supine     Supine to sit: HOB elevated;Min assist Sit to supine: Min assist   General bed mobility comments: min A to get left leg into bed, pt did a good job staying on her side until legs in bed and log rolling  Transfers Overall transfer level: Needs assistance Equipment used: Rolling walker (2 wheeled) Transfers: Sit to/from [facility] Sit to Stand: Min assist Stand pivot transfers: Min assist       General transfer comment: cues for hand placement  Ambulation/Gait Ambulation/Gait assistance: Min assist Ambulation Distance (Feet): 80 Feet Assistive device: Rolling walker (2 wheeled) Gait Pattern/deviations: Decreased step length - left;Decreased weight shift to left;Decreased stance time -  left Gait velocity: decreased Gait velocity interpretation: Below normal speed for age/gender General Gait Details: significant wt through UE's to offset wt from LLE, no buckling noted left knee  Stairs            Wheelchair Mobility    Modified Rankin (Stroke Patients Only)       Balance Overall balance assessment: Needs assistance Sitting-balance support: No upper extremity supported Sitting balance-Leahy Scale: Good     Standing balance support: Bilateral upper extremity supported Standing balance-Leahy Scale: Poor Standing balance comment: requires UE support to maintain standing at this point due to LLE weakness                             Pertinent Vitals/Pain Pain Assessment: 0-10 Pain Score: 8  Pain Location: back Pain Descriptors / Indicators: Aching Pain Intervention(s): Monitored during session    Home Living Family/patient expects to be discharged to:: Private residence Living Arrangements: Spouse/significant other Available Help at Discharge: Family;Available 24 hours/day Type of Home: Apartment Home Access: Stairs to enter Entrance Stairs-Rails: None Entrance Stairs-Number of Steps: 2, 1 to side walk & 1 to porch; patient reports landlord has built ramps Home Layout: One level Home Equipment: Environmental consultant - 2 wheels;Walker - 4 wheels;Bedside commode;Wheelchair - manual      Prior Function Level of Independence: Needs assistance   Gait / Transfers Assistance Needed: Patient reports husband assisted with w/c transfers and occasionally assist with in/out of bed since November  ADL's / Homemaking Assistance Needed: Patient reports her husband assists with transfer in/out tub/shower, donning/doffing socks & shoes since November  Comments: spouse does the cooking and cleaning     Hand Dominance  Dominant Hand: Right    Extremity/Trunk Assessment   Upper Extremity Assessment: Defer to OT evaluation           Lower Extremity  Assessment: LLE deficits/detail   LLE Deficits / Details: knee ext 2-/5, hip flex 2-/5, knee flex 3/5  Cervical / Trunk Assessment: Normal  Communication   Communication: No difficulties  Cognition Arousal/Alertness: Awake/alert Behavior During Therapy: WFL for tasks assessed/performed Overall Cognitive Status: History of cognitive impairments - at baseline                      General Comments      Exercises General Exercises - Lower Extremity Ankle Circles/Pumps: AROM;Both;10 reps;Supine      Assessment/Plan    PT Assessment Patient needs continued PT services  PT Diagnosis Difficulty walking;Abnormality of gait;Acute pain;Generalized weakness   PT Problem List Decreased strength;Decreased range of motion;Decreased activity tolerance;Decreased balance;Decreased mobility;Decreased cognition;Decreased knowledge of use of DME;Decreased knowledge of precautions;Pain  PT Treatment Interventions DME instruction;Gait training;Stair training;Functional mobility training;Therapeutic activities;Therapeutic exercise;Balance training;Patient/family education   PT Goals (Current goals can be found in the Care Plan section) Acute Rehab PT Goals Patient Stated Goal: go home PT Goal Formulation: With patient Time For Goal Achievement: 05/02/14 Potential to Achieve Goals: Good    Frequency Min 5X/week   Barriers to discharge        Co-evaluation               End of Session Equipment Utilized During Treatment: Gait belt;Back brace Activity Tolerance: Patient tolerated treatment well Patient left: in bed;with bed alarm set;with call bell/phone within reach;with family/visitor present Nurse Communication: Mobility status         Time: 0938-1829 PT Time Calculation (min) (ACUTE ONLY): 22 min   Charges:   PT Evaluation $Initial PT Evaluation Tier I: 1 Procedure PT Treatments $Gait Training: 8-22 mins   PT G Codes:       Leighton Roach, PT  Acute Rehab  Services  [phone]  Leighton Roach 04/25/2014, 11:35 AM

## 2014-04-25 NOTE — Progress Notes (Signed)
Subjective: Patient reports doing well with less leg pain and weakness  Objective: Vital signs in last 24 hours: Temp:  [97.8 F (36.6 C)-98.7 F (37.1 C)] 98.2 F (36.8 C) (01/20 0129) Pulse Rate:  [87-112] 112 (01/20 0129) Resp:  [15-18] 16 (01/20 0129) BP: (110-143)/(64-97) 123/64 mmHg (01/20 0129) SpO2:  [90 %-95 %] 91 % (01/20 0129) Weight:  [94.348 kg (208 lb)] 94.348 kg (208 lb) (01/19 1030)  Intake/Output from previous day: 01/19 0701 - 01/20 0700 In: 2000 [I.V.:1500; IV Piggyback:500] Out: 413 [Urine:875] Intake/Output this shift:    Physical Exam: Still some quadriceps weakness on the left, but strength is improved. Dressing CDI.  Otherwise full strength both lower extremities.  Lab Results:  Recent Labs  04/23/14 1131  WBC 7.5  HGB 14.1  HCT 40.6  PLT 198   BMET  Recent Labs  04/23/14 1131  NA 137  K 4.4  CL 102  CO2 25  GLUCOSE 207*  BUN 7  CREATININE 0.74  CALCIUM 9.0    Studies/Results: Dg Lumbar Spine 2-3 Views  04/24/2014   CLINICAL DATA:  L4-5 PLIF.  EXAM: DG C-ARM 61-120 MIN; LUMBAR SPINE - 2-3 VIEW  COMPARISON:  Lumbar spine CT 04/05/2014  FINDINGS: Lumbar numbering continue from prior CT. Two intraoperative spot fluoroscopic images of the lumbar spine are provided. Numbering is performed on the lateral image and demonstrates performance of L4-5 PLIF. Posterior soft tissue retractors are noted.  IMPRESSION: Intraoperative images during L4-5 PLIF.   Electronically Signed   By: Logan Bores   On: 04/24/2014 16:49   Dg C-arm 1-60 Min  04/24/2014   CLINICAL DATA:  L4-5 PLIF.  EXAM: DG C-ARM 61-120 MIN; LUMBAR SPINE - 2-3 VIEW  COMPARISON:  Lumbar spine CT 04/05/2014  FINDINGS: Lumbar numbering continue from prior CT. Two intraoperative spot fluoroscopic images of the lumbar spine are provided. Numbering is performed on the lateral image and demonstrates performance of L4-5 PLIF. Posterior soft tissue retractors are noted.  IMPRESSION:  Intraoperative images during L4-5 PLIF.   Electronically Signed   By: Logan Bores   On: 04/24/2014 16:49    Assessment/Plan: Mobilize with PT today.  Doing well.  Anticipate D/C in AM.    LOS: 1 day    Maile Linford D, MD 04/25/2014, 7:48 AM

## 2014-04-25 NOTE — Progress Notes (Signed)
Inpatient Diabetes Program Recommendations  AACE/ADA: New Consensus Statement on Inpatient Glycemic Control (2013)  Target Ranges:  Prepandial:   less than 140 mg/dL      Peak postprandial:   less than 180 mg/dL (1-2 hours)      Critically ill patients:  140 - 180 mg/dL   Reason for Assessment:  Results for SHERMA, VANMETRE (MRN [id number]) as of 04/25/2014 11:27  Ref. Range 04/24/2014 10:38 04/24/2014 13:59 04/24/2014 17:32 04/24/2014 21:18 04/25/2014 06:35  Glucose-Capillary Latest Range: 70-99 mg/dL 201 (H) 169 (H) 176 (H) 195 (H) 246 (H)   Diabetes history: Type 2 diabetes Outpatient Diabetes medications: Novolin 70/30 90 units q AM, and 40 units q PM Current orders for Inpatient glycemic control:  Novolog moderate tid with meals, Novolog 4 units tid with meals  MD please restart at least 1/2 of patient's home dose of 70/30.  Consider 70/30 45 units with breakfast and 20 units 70/30 with supper. Note that if 70/30 resumed, will need to discontinue Novolog meal coverage 4 units tid.  Called and discussed with RN.  Thanks, Adah Perl, RN, BC-ADM Inpatient Diabetes Coordinator Pager [phone]

## 2014-04-26 LAB — GLUCOSE, CAPILLARY
Glucose-Capillary: 272 mg/dL — ABNORMAL HIGH (ref 70–99)
Glucose-Capillary: 224 mg/dL — ABNORMAL HIGH (ref 70–99)

## 2014-04-26 NOTE — Progress Notes (Signed)
Occupational Therapy Treatment Patient Details Name: [name] MRN: [id number] DOB: 06-Mar-1951 Today's Date: 04/26/2014    History of present illness s/p Lumbar four-five Maximum access Surgery posterior lumbar interbody fusion   OT comments  Patient progressing towards goals, continue plan of care for now. Patient continues to need education on use of AE to increase independence, education on adherence to back precautions, education on donning/doffing of lumbar corset. Patient reports that her husband is available 24/7 for supervision/assistance prn.     Follow Up Recommendations  Home health OT;Supervision/Assistance - 24 hour    Equipment Recommendations  None recommended by OT    Recommendations for Other Services  None at this time    Precautions / Restrictions Precautions Precautions: Back;Fall Precaution Booklet Issued: Yes (comment) Precaution Comments: able to verbalize 3/3 precautions, requries cues to adhere to precautions in functional setting Required Braces or Orthoses: Spinal Brace Spinal Brace: Lumbar corset;Applied in sitting position Restrictions Weight Bearing Restrictions: No       Mobility Bed Mobility Overal bed mobility: Needs Assistance Bed Mobility: Supine to Sit;Rolling;Sit to Supine Rolling: Supervision   Supine to sit: Supervision Sit to supine: Min assist (patient required assistance with getting LLE into bed)   General bed mobility comments: Using bed rails, min verbal cues for correct log roll technique in order to maintain back precautions  Transfers Overall transfer level: Needs assistance Equipment used: Rolling walker (2 wheeled) Transfers: Sit to/from Stand Sit to Stand: Min assist Stand pivot transfers: Min assist       General transfer comment: cues for hand placement and sequencing    Balance - Per PT note Overall balance assessment: Needs assistance Sitting-balance support: No upper extremity supported Sitting  balance-Leahy Scale: Good     Standing balance support: Bilateral upper extremity supported;During functional activity Standing balance-Leahy Scale: Good Standing balance comment: able to accept perturbbation and maintain midline      ADL Overall ADL's : Needs assistance/impaired Eating/Feeding: Independent   Grooming: Sitting;Set up   Upper Body Bathing: Set up;Sitting;Supervision/ safety   Lower Body Bathing: Minimal assistance;Cueing for safety;Sit to/from stand;With adaptive equipment;Adhering to back precautions   Upper Body Dressing : Set up;Sitting;Supervision/safety   Lower Body Dressing: Moderate assistance;Cueing for safety;Sit to/from stand;With adaptive equipment;Adhering to back precautions   Toilet Transfer: Minimal assistance;BSC;RW;Cueing for safety           Functional mobility during ADLs: Minimal assistance;Cueing for safety;Rolling walker General ADL Comments: Patient required min verbal cues in order to adhere to back precautions. Demonstrated use of reacher, sock aid, LH shoe horn, and LH sponge for LB ADLs. Patient return demonstrated use of reacher, sock aid in order to complete LB dressing tasks. Patient return demonstrated donning/doffing of lumbar corset as well. Patient continues to need education on use of equipment, donning/doffing of lumbar corset, and adherence to back precautions                Cognition   Behavior During Therapy: [facility] for tasks assessed/performed Overall Cognitive Status: Within Functional Limits for tasks assessed                Pertinent Vitals/ Pain       Pain Assessment: 0-10 Pain Score: 5  Pain Location: back Pain Descriptors / Indicators: Sore Pain Intervention(s): Monitored during session;Repositioned         Frequency Min 2X/week     Progress Toward Goals  OT Goals(current goals can now be found in the care plan section)  Progress  towards OT goals: Progressing toward goals  Acute Rehab OT  Goals Patient Stated Goal: go home  Plan Discharge plan remains appropriate       End of Session Equipment Utilized During Treatment: Rolling walker;Back brace   Activity Tolerance Patient tolerated treatment well   Patient Left in bed;with call bell/phone within reach;with family/visitor present;with bed alarm set   Nurse Communication Mobility status     Time: 1009-1030 OT Time Calculation (min): 21 min  Charges: OT General Charges $OT Visit: 1 Procedure OT Treatments $Self Care/Home Management : 8-22 mins  Katyra Tomassetti , MS, OTR/L, CLT Pager: [phone]  04/26/2014, 10:40 AM

## 2014-04-26 NOTE — Progress Notes (Signed)
Physical Therapy Treatment Patient Details Name: [name] MRN: [id number] DOB: 09-23-1950 Today's Date: 04/26/2014    History of Present Illness s/p Lumbar four-five Maximum access Surgery posterior lumbar interbody fusion    PT Comments    Pt. Did very well maneuvering in tight spaces. Pain was 8/10. Pt. Being d/c'd apartment and will have assistance from husband.   Follow Up Recommendations  Home health PT;Supervision for mobility/OOB     Equipment Recommendations  None recommended by PT    Recommendations for Other Services       Precautions / Restrictions Precautions Precautions: Back;Fall Precaution Booklet Issued: Yes (comment) Precaution Comments: able to verbalize 3/3 precautions Required Braces or Orthoses: Spinal Brace Spinal Brace: Lumbar corset;Applied in sitting position Restrictions Weight Bearing Restrictions: No    Mobility  Bed Mobility Overal bed mobility: Needs Assistance Bed Mobility: Sit to Supine;Rolling       Sit to supine: Min assist   General bed mobility comments: min A to get left leg into bed, pt did a good job staying on her side until legs in bed and log rolling  Transfers Overall transfer level: Needs assistance Equipment used: Rolling walker (2 wheeled) Transfers: Sit to/from [facility] Sit to Stand: Min assist Stand pivot transfers: Min assist       General transfer comment: cues for hand placement and sequencing  Ambulation/Gait Ambulation/Gait assistance: Min guard Ambulation Distance (Feet): 90 Feet Assistive device: Rolling walker (2 wheeled) Gait Pattern/deviations: Decreased weight shift to left;Decreased step length - left;Antalgic;Step-to pattern Gait velocity: decreased Gait velocity interpretation: Below normal speed for age/gender General Gait Details: took her into tight hallways to simulate home environment; able to plan majority, ran into doorway x1   Stairs             Wheelchair Mobility    Modified Rankin (Stroke Patients Only)       Balance Overall balance assessment: Needs assistance Sitting-balance support: No upper extremity supported Sitting balance-Leahy Scale: Good     Standing balance support: Bilateral upper extremity supported;During functional activity Standing balance-Leahy Scale: Good Standing balance comment: able to accept perturbbation and maintain midline                    Cognition Arousal/Alertness: Awake/alert Behavior During Therapy: WFL for tasks assessed/performed Overall Cognitive Status: History of cognitive impairments - at baseline                      Exercises      General Comments        Pertinent Vitals/Pain Pain Assessment: 0-10 Pain Score: 8  Pain Location: back Pain Descriptors / Indicators: Sore Pain Intervention(s): Monitored during session    Home Living                      Prior Function            PT Goals (current goals can now be found in the care plan section) Progress towards PT goals: Progressing toward goals    Frequency  Min 5X/week    PT Plan Current plan remains appropriate    Co-evaluation             End of Session Equipment Utilized During Treatment: Gait belt;Back brace Activity Tolerance: Patient tolerated treatment well Patient left: in bed;with call bell/phone within reach;with bed alarm set     Time: 4098-1191 PT Time Calculation (min) (ACUTE ONLY): 26 min  Charges:  G Codes:      Jodi Geralds, [place] 04/26/2014, 9:27 AM

## 2014-04-26 NOTE — Progress Notes (Signed)
Subjective: Patient reports "I feel fine! And look, my leg is stronger"  Objective: Vital signs in last 24 hours: Temp:  [98.2 F (36.8 C)-100 F (37.8 C)] 99.3 F (37.4 C) (01/21 1044) Pulse Rate:  [95-108] 101 (01/21 1044) Resp:  [16-20] 20 (01/21 1044) BP: (112-146)/(61-74) 131/70 mmHg (01/21 1044) SpO2:  [91 %-100 %] 100 % (01/21 1044)  Intake/Output from previous day: 01/20 0701 - 01/21 0700 In: 483 [P.O.:480; I.V.:3] Out: -  Intake/Output this shift:    Alert, up in chair. Pt pleased with resolution of leg pain. Only reports lumbar pain after walking. Incision without erythema swelling, or drainage, beneath honeycomb drsg. Good strength RLE, improving strength LLE (hip flexor).   Lab Results: No results for input(s): WBC, HGB, HCT, PLT in the last 72 hours. BMET No results for input(s): NA, K, CL, CO2, GLUCOSE, BUN, CREATININE, CALCIUM in the last 72 hours.  Studies/Results: Dg Lumbar Spine 2-3 Views  04/24/2014   CLINICAL DATA:  L4-5 PLIF.  EXAM: DG C-ARM 61-120 MIN; LUMBAR SPINE - 2-3 VIEW  COMPARISON:  Lumbar spine CT 04/05/2014  FINDINGS: Lumbar numbering continue from prior CT. Two intraoperative spot fluoroscopic images of the lumbar spine are provided. Numbering is performed on the lateral image and demonstrates performance of L4-5 PLIF. Posterior soft tissue retractors are noted.  IMPRESSION: Intraoperative images during L4-5 PLIF.   Electronically Signed   By: Logan Bores   On: 04/24/2014 16:49   Dg C-arm 1-60 Min  04/24/2014   CLINICAL DATA:  L4-5 PLIF.  EXAM: DG C-ARM 61-120 MIN; LUMBAR SPINE - 2-3 VIEW  COMPARISON:  Lumbar spine CT 04/05/2014  FINDINGS: Lumbar numbering continue from prior CT. Two intraoperative spot fluoroscopic images of the lumbar spine are provided. Numbering is performed on the lateral image and demonstrates performance of L4-5 PLIF. Posterior soft tissue retractors are noted.  IMPRESSION: Intraoperative images during L4-5 PLIF.    Electronically Signed   By: Logan Bores   On: 04/24/2014 16:49    Assessment/Plan: Improving   LOS: 2 days  Ok per DrStern to d/c IV & d/c to home. Rx's for Norco & Flexeril to chart for home use PRN. Pt verbalizes understanding of d/c instructions & agrees to call office for 4 week post-op appt.   Verdis Prime 04/26/2014, 1:04 PM

## 2014-04-26 NOTE — Discharge Summary (Signed)
Physician Discharge Summary  Patient ID: [name] MRN: [id number] DOB/AGE: 66/12/1950 66 y.o.  Admit date: 04/24/2014 Discharge date: 04/26/2014  Admission Diagnoses: Spinal stenosis of lumbar region, Scoliosis, Lumbar radiculopathy, Low back pain L 45 level   Discharge Diagnoses: Spinal stenosis of lumbar region, Scoliosis, Lumbar radiculopathy, Low back pain L 45 level s/p Lumbar four-five Maximum access Surgery posterior lumbar interbody fusion (N/A) - Lumbar four-five Maximum access Surgery posterior lumbar interbody fusion with PEK Cages (Decompression greater than for standard PLIF procedure) with pedicle screw fixation and posterolateral arthrodesis with local autograft/allograft  Active Problems:   Lumbar scoliosis   Discharged Condition: good  Hospital Course: [name] was admitted for surgery with dx stenosis, scoliosis, radiculopathy.  Following uncomplicated surgery, she recovered well in Neuro PACU, and transferring to 4N for nursing care and therapies. She has progressed nicely.  Consults: None  Significant Diagnostic Studies: radiology: X-Ray: intra-operative  Treatments: surgery: Lumbar four-five Maximum access Surgery posterior lumbar interbody fusion (N/A) - Lumbar four-five Maximum access Surgery posterior lumbar interbody fusion with PEK Cages (Decompression greater than for standard PLIF procedure) with pedicle screw fixation and posterolateral arthrodesis with local autograft/allograft   Discharge Exam: Blood pressure 131/70, pulse 101, temperature 99.3 F (37.4 C), temperature source Oral, resp. rate 20, height 5' 4.5" (1.638 m), weight 94.348 kg (208 lb), SpO2 100 %. Alert, up in chair. Pt pleased with resolution of leg pain. Only reports lumbar pain after walking. Incision without erythema swelling, or drainage, beneath honeycomb drsg. Good strength RLE, improving strength LLE (hip flexor).    Disposition: 01-Home or [place] per DrStern to d/c  IV & d/c to home. Rx's for Norco & Flexeril to chart for home use PRN. Pt verbalizes understanding of d/c instructions & agrees to call office for 4 week post-op appt.      Medication List    ASK your doctor about these medications        albuterol 108 (90 BASE) MCG/ACT inhaler  Commonly known as:  PROAIR HFA  Inhale 2 puffs into the lungs every 6 (six) hours as needed for wheezing or shortness of breath.     albuterol (2.5 MG/3ML) 0.083% nebulizer solution  Commonly known as:  PROVENTIL  Take 3 mLs (2.5 mg total) by nebulization every 6 (six) hours as needed for wheezing or shortness of breath.     amLODipine 10 MG tablet  Commonly known as:  NORVASC  Take 1 tablet (10 mg total) by mouth daily.     aspirin EC 81 MG tablet  Take 81 mg by mouth every morning.     DULoxetine 60 MG capsule  Commonly known as:  CYMBALTA  Take 1 capsule (60 mg total) by mouth daily.     FISH OIL PO  Take 1 capsule by mouth daily.     gabapentin 100 MG capsule  Commonly known as:  NEURONTIN  Take 300 mg by mouth 2 (two) times daily.     glucose blood test strip  - Use TID  -   - E11.9     HYDROcodone-acetaminophen 5-325 MG per tablet  Commonly known as:  NORCO/VICODIN  Take 1-2 tablets by mouth every 6 (six) hours as needed for moderate pain.     insulin NPH-regular Human (70-30) 100 UNIT/ML injection  Commonly known as:  NOVOLIN 70/30  Inject 40-90 Units into the skin 2 (two) times daily with a meal. 90 units with breakfast and 40 units with evening meal and syringes 2/day.  levothyroxine 75 MCG tablet  Commonly known as:  SYNTHROID, LEVOTHROID  Take 1 tablet (75 mcg total) by mouth daily before breakfast.     metoprolol succinate 50 MG 24 hr tablet  Commonly known as:  TOPROL-XL  Take 1 tablet (50 mg total) by mouth 2 (two) times daily. Take with or immediately following a meal.     MULTIVITAMIN PO  Take 1 tablet by mouth daily.     predniSONE 10 MG tablet  Commonly known  as:  DELTASONE  6, 5, 4, 3, 2 then 1 tablet by mouth daily for 6 days total.     predniSONE 10 MG tablet  Commonly known as:  DELTASONE  Tapered dose 40 mg x 1 day, 30 mg x 2 days, 20 mg x 2 days, then 10 mg x 2 days.     primidone 50 MG tablet  Commonly known as:  MYSOLINE  TAKE THREE TABLETS BY MOUTH TWICE DAILY     ranitidine 150 MG tablet  Commonly known as:  ZANTAC  Take 150 mg by mouth 2 (two) times daily.     sertraline 100 MG tablet  Commonly known as:  ZOLOFT  Take one and one half tablets daily         Signed: Verdis Prime 04/26/2014, 1:27 PM

## 2014-04-27 NOTE — Care Management Note (Signed)
    Page 1 of 1   04/27/2014     10:13:07 AM CARE MANAGEMENT NOTE 04/27/2014  Patient:  [name], [name]   Account Number:  [id number]  Date Initiated:  04/27/2014  Documentation initiated by:  Lorne Skeens  Subjective/Objective Assessment:   Patient was admitted for surgery.  Lives at home with husband.     Action/Plan:   Will follow for discharge needs pending PT/OT evals and physician orders.   Anticipated DC Date:  04/26/2014   Anticipated DC Plan:  [place]  CM consult      Choice offered to / List presented to:  C-1 Patient        Reserve arranged  [place].   Status of service:  Completed, signed off Medicare Important Message given?  NA - LOS <3 / Initial given by admissions (If response is "NO", the following Medicare IM given date fields will be blank) Date Medicare IM given:   Medicare IM given by:   Date Additional Medicare IM given:   Additional Medicare IM given by:    Discharge Disposition:  [place]  Per UR Regulation:  Reviewed for med. necessity/level of care/duration of stay  If discussed at [place] of Stay Meetings, dates discussed:    Comments:  04/26/14  Lorne Skeens RN, MSN, CM- Met with patient and husband to discuss home health needs.  Patient is agreeable and has chosen [facility], as they have used them in the past.  Miranda with [facility] was notified and has accepted the referral for discharge home today.

## 2014-05-03 ENCOUNTER — Telehealth: Payer: Self-pay | Admitting: Endocrinology

## 2014-05-03 MED ORDER — INSULIN NPH ISOPHANE & REGULAR (70-30) 100 UNIT/ML ~~LOC~~ SUSP: 40.0000 [IU] | Freq: Two times a day (BID) | SUBCUTANEOUS | Status: DC

## 2014-05-03 NOTE — Telephone Encounter (Signed)
New rx needed for humulin 70/30 please call into [facility] in [place]

## 2014-05-03 NOTE — Telephone Encounter (Signed)
Rx sent to pharmacy   

## 2014-05-04 ENCOUNTER — Telehealth: Payer: Self-pay

## 2014-05-04 ENCOUNTER — Ambulatory Visit: Payer: Medicare Other | Admitting: Endocrinology

## 2014-05-04 MED ORDER — INSULIN NPH ISOPHANE & REGULAR (70-30) 100 UNIT/ML ~~LOC~~ SUSP: SUBCUTANEOUS | Status: DC

## 2014-05-04 NOTE — Telephone Encounter (Signed)
ok 

## 2014-05-04 NOTE — Telephone Encounter (Signed)
Rx sent to pharmacy   

## 2014-05-04 NOTE — Telephone Encounter (Signed)
Received notice from pt's pharmacy that the Novolin 70/30 is no longer covered. Ok to change to Humulin 70/30? Thanks!

## 2014-05-07 ENCOUNTER — Telehealth: Payer: Self-pay | Admitting: Neurology

## 2014-05-07 NOTE — Telephone Encounter (Signed)
Pt canceled her 05-10-14 appt due to her having had back surgery and will call back to resch

## 2014-05-10 ENCOUNTER — Ambulatory Visit (HOSPITAL_COMMUNITY): Payer: Self-pay | Admitting: Psychiatry

## 2014-05-10 ENCOUNTER — Ambulatory Visit: Payer: Self-pay | Admitting: Neurology

## 2014-05-12 ENCOUNTER — Encounter (HOSPITAL_COMMUNITY): Payer: Self-pay | Admitting: Emergency Medicine

## 2014-05-12 ENCOUNTER — Emergency Department (HOSPITAL_COMMUNITY)
Admission: EM | Admit: 2014-05-12 | Discharge: 2014-05-12 | Disposition: A | Discharge status: Home / Self Care | Payer: Medicare Other | Attending: Emergency Medicine | Admitting: Emergency Medicine

## 2014-05-12 DIAGNOSIS — Z8739 Personal history of other diseases of the musculoskeletal system and connective tissue: Secondary | ICD-10-CM | POA: Diagnosis not present

## 2014-05-12 DIAGNOSIS — S8992XA Unspecified injury of left lower leg, initial encounter: Secondary | ICD-10-CM | POA: Insufficient documentation

## 2014-05-12 DIAGNOSIS — J45909 Unspecified asthma, uncomplicated: Secondary | ICD-10-CM | POA: Diagnosis not present

## 2014-05-12 DIAGNOSIS — I10 Essential (primary) hypertension: Secondary | ICD-10-CM | POA: Diagnosis not present

## 2014-05-12 DIAGNOSIS — K219 Gastro-esophageal reflux disease without esophagitis: Secondary | ICD-10-CM | POA: Insufficient documentation

## 2014-05-12 DIAGNOSIS — Z9889 Other specified postprocedural states: Secondary | ICD-10-CM | POA: Insufficient documentation

## 2014-05-12 DIAGNOSIS — Z7951 Long term (current) use of inhaled steroids: Secondary | ICD-10-CM | POA: Insufficient documentation

## 2014-05-12 DIAGNOSIS — Y998 Other external cause status: Secondary | ICD-10-CM | POA: Diagnosis not present

## 2014-05-12 DIAGNOSIS — E039 Hypothyroidism, unspecified: Secondary | ICD-10-CM | POA: Insufficient documentation

## 2014-05-12 DIAGNOSIS — W1839XA Other fall on same level, initial encounter: Secondary | ICD-10-CM | POA: Diagnosis not present

## 2014-05-12 DIAGNOSIS — F329 Major depressive disorder, single episode, unspecified: Secondary | ICD-10-CM | POA: Diagnosis not present

## 2014-05-12 DIAGNOSIS — Z7982 Long term (current) use of aspirin: Secondary | ICD-10-CM | POA: Diagnosis not present

## 2014-05-12 DIAGNOSIS — F419 Anxiety disorder, unspecified: Secondary | ICD-10-CM | POA: Insufficient documentation

## 2014-05-12 DIAGNOSIS — Y9389 Activity, other specified: Secondary | ICD-10-CM | POA: Insufficient documentation

## 2014-05-12 DIAGNOSIS — E119 Type 2 diabetes mellitus without complications: Secondary | ICD-10-CM | POA: Insufficient documentation

## 2014-05-12 DIAGNOSIS — Z79899 Other long term (current) drug therapy: Secondary | ICD-10-CM | POA: Diagnosis not present

## 2014-05-12 DIAGNOSIS — G8929 Other chronic pain: Secondary | ICD-10-CM | POA: Insufficient documentation

## 2014-05-12 DIAGNOSIS — M79606 Pain in leg, unspecified: Secondary | ICD-10-CM

## 2014-05-12 DIAGNOSIS — Z87891 Personal history of nicotine dependence: Secondary | ICD-10-CM | POA: Insufficient documentation

## 2014-05-12 DIAGNOSIS — Y9289 Other specified places as the place of occurrence of the external cause: Secondary | ICD-10-CM | POA: Diagnosis not present

## 2014-05-12 LAB — BASIC METABOLIC PANEL
Anion gap: 6 (ref 5–15)
BUN: 9 mg/dL (ref 6–23)
CO2: 29 mmol/L (ref 19–32)
Calcium: 8.8 mg/dL (ref 8.4–10.5)
Chloride: 99 mmol/L (ref 96–112)
Creatinine, Ser: 0.81 mg/dL (ref 0.50–1.10)
GFR calc Af Amer: 86 mL/min — ABNORMAL LOW (ref >90)
GFR calc non Af Amer: 75 mL/min — ABNORMAL LOW (ref >90)
Glucose, Bld: 239 mg/dL — ABNORMAL HIGH (ref 70–99)
Potassium: 3.4 mmol/L — ABNORMAL LOW (ref 3.5–5.1)
Sodium: 134 mmol/L — ABNORMAL LOW (ref 135–145)

## 2014-05-12 LAB — MAGNESIUM: Magnesium: 1.8 mg/dL (ref 1.5–2.5)

## 2014-05-12 MED ORDER — CYCLOBENZAPRINE HCL 10 MG PO TABS
5.0000 mg | ORAL_TABLET | Freq: Once | ORAL | Status: AC
  Administered 2014-05-12: 5 mg via ORAL
  Filled 2014-05-12: qty 1

## 2014-05-12 MED ORDER — METHOCARBAMOL 500 MG PO TABS: 500.0000 mg | ORAL_TABLET | Freq: Two times a day (BID) | ORAL | Status: DC

## 2014-05-12 MED ORDER — POTASSIUM CHLORIDE CRYS ER 20 MEQ PO TBCR
40.0000 meq | EXTENDED_RELEASE_TABLET | Freq: Once | ORAL | Status: AC
  Administered 2014-05-12: 40 meq via ORAL
  Filled 2014-05-12: qty 2

## 2014-05-12 NOTE — Discharge Instructions (Signed)

## 2014-05-12 NOTE — ED Notes (Signed)
Patient with no complaints at this time. Respirations even and unlabored. Skin warm/dry. Discharge instructions reviewed with patient at this time. Patient given opportunity to voice concerns/ask questions. Patient discharged at this time and left Emergency Department with steady gait.   

## 2014-05-12 NOTE — ED Notes (Signed)
Patient with c/o bilateral leg pain x 2 weeks, worse since Friday. Had back surgery (believed to be fusion) 3 weeks ago. Able to ambulate with assistance. Alert/oriented x 4. No bowel/bladder incontinence.

## 2014-05-12 NOTE — ED Provider Notes (Signed)
CSN: [id number]     Arrival date & time 05/12/14  0930 History  This chart was scribed for Tanna Furry, MD by Evelene Croon, ED Scribe. This patient was seen in room APA05/APA05 and the patient's care was started 10:22 AM.    Chief Complaint  Patient presents with  . Leg Pain    The history is provided by the patient. No language interpreter was used.     HPI Comments:  [name] is a 64 y.o. female with a h/o lower back surgery ~3 weeks ago who presents to the Emergency Department complaining of sharp pain to her BLE for a few days. She notes pain starts in her hips and radiates down the front of her lower extremities. She also states pain is worse at night and when she first wakes. She reports residual LLE weakness following back surgery and 2 accidental falls due to LLE weakness. She denies urinary/bowel incontinence, back pain and acute focal weakness. She is currently taking Vicodin with moderate relief of pain but  notes she has to take it frequently and is almost out.     Past Medical History  Diagnosis Date  . 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 [name], residual palpitations  . Vocal cord dysfunction   . Tremor, essential     takes Mysoline and Neurontin daily.  . Allergic rhinitis     uses Nasonex daily as needed  . Low sodium syndrome   . Chronic back pain     reason unknown  . Lumbar radiculopathy   . DDD (degenerative disc disease), lumbar   . Esophageal reflux     takes Zantac daily  . Hypertension     takes Losartan daily  . Anxiety     takes Ativan daily as needed  . Hypothyroidism     takes Synthroid daily  . Depression     takes Zoloft daily as well as Cymbalta  . Diabetes mellitus     takes NOvolin daily  . Asthma     Albuterol inhaler and Neb daily as needed  . History of bronchitis Dec 2014  . Seizures 04-05-13    one time ran out of Primidone and had stopped it cold [place]  . Weakness     in left  arm  . Joint pain   . Joint swelling     left ankle  . Dysphagia   . Constipation   . Allergy to angiotensin receptor blockers (ARB) 01/22/2014    Angioedema  . 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 Rayann Heman 2007   Past Surgical History  Procedure Laterality Date  . Tonsillectomy    . Right breast cyst      benign  . Left ankle ligament repair      x 2  . Left elbow repair    . Partial hysterectomy    . Cholecystectomy    . Cesarean section    . Ablation for avnrt    . Colonoscopy  01/16/2004    KZS:WFUXNA rectum/colon  . Esophagogastroduodenoscopy (egd) with esophageal dilation  04/03/2002    TFT:[id number]'Y ring, otherwise normal esophagus, status post dilation with 54 F/Normal stomach  . Bartholin gland cyst excision      x 2  . Esophagogastroduodenoscopy (egd) with esophageal dilation N/A 11/08/2012    Procedure: ESOPHAGOGASTRODUODENOSCOPY (EGD) WITH ESOPHAGEAL DILATION;  Surgeon: Daneil Dolin, MD;  Location: [facility];  Service: Endoscopy;  Laterality: N/A;  11:30  . Subthalamic stimulator insertion Bilateral 12/04/2013    Procedure: Bilateral Deep brain stimulator placement;  Surgeon: [name], MD;  Location: [place] [facility];  Service: Neurosurgery;  Laterality: Bilateral;  Bilateral Deep brain stimulator placement  . Pulse generator implant Bilateral 12/15/2013    Procedure: BILATERAL PULSE GENERATOR IMPLANT;  Surgeon: [name], MD;  Location: [place] [facility];  Service: Neurosurgery;  Laterality: Bilateral;  BILATERAL  . Maximum access (mas)posterior lumbar interbody fusion (plif) 1 level N/A 04/24/2014    Procedure: Lumbar four-five Maximum access Surgery posterior lumbar interbody fusion;  Surgeon: [name], MD;  Location: [place] [facility];  Service: Neurosurgery;  Laterality: N/A;  Lumbar four-five Maximum access Surgery posterior lumbar interbody fusion   Family History  Problem Relation Age of Onset  . Lung cancer Father     DIED AGE 1 LUNG CA  . Alcohol  abuse Father   . Anxiety disorder Father   . Depression Father   . COPD Mother     DIED AGE 74,MRSA,COPD,PNEUMONIA  . Pneumonia Mother     DIED AGE 74,MRSA,COPD,PNEUMONIA  . Supraventricular tachycardia Mother   . Anxiety disorder Mother   . Depression Mother   . Alcohol abuse Mother   . COPD Brother     AGE 54  . Heart disease Sister     DIED AGE 51, SMOKER,?HEART  . Colon cancer Neg Hx    History  Substance Use Topics  . Smoking status: Former Smoker -- 0.30 packs/day for 10 years    Types: Cigarettes    Quit date: 04/23/1993  . Smokeless tobacco: Never Used     Comment: quit smoking 25+yrs ago  . Alcohol Use: No     Comment: no alcohol in 64yrs   OB History    No data available     Review of Systems  Constitutional: Negative for fever, chills, diaphoresis, appetite change and fatigue.  HENT: Negative for mouth sores, sore throat and trouble swallowing.   Eyes: Negative for visual disturbance.  Respiratory: Negative for cough, chest tightness, shortness of breath and wheezing.   Cardiovascular: Negative for chest pain.  Gastrointestinal: Negative for nausea, vomiting, abdominal pain, diarrhea and abdominal distention.  Endocrine: Negative for polydipsia, polyphagia and polyuria.  Genitourinary: Negative for dysuria, frequency and hematuria.  Musculoskeletal: Positive for myalgias. Negative for gait problem.  Skin: Negative for color change, pallor and rash.  Neurological: Negative for dizziness, syncope, light-headedness and headaches.  Hematological: Does not bruise/bleed easily.  Psychiatric/Behavioral: Negative for behavioral problems and confusion.  All other systems reviewed and are negative.     Allergies  Losartan; Metformin and related; Morphine and related; and Oxycodone-acetaminophen  Home Medications   Prior to Admission medications   Medication Sig Start Date End Date Taking? Authorizing Provider  albuterol (PROAIR HFA) 108 (90 BASE) MCG/ACT  inhaler Inhale 2 puffs into the lungs every 6 (six) hours as needed for wheezing or shortness of breath. 08/03/13  Yes Tammy S Parrett, NP  albuterol (PROVENTIL) (2.5 MG/3ML) 0.083% nebulizer solution Take 3 mLs (2.5 mg total) by nebulization every 6 (six) hours as needed for wheezing or shortness of breath. 02/01/14  Yes Deneise Lever, MD  amLODipine (NORVASC) 10 MG tablet Take 1 tablet (10 mg total) by mouth daily. 03/26/14  Yes Janith Lima, MD  aspirin EC 81 MG tablet Take 81 mg by mouth every morning.    Yes Historical Provider, MD  DULoxetine (CYMBALTA) 60 MG capsule Take 1  capsule (60 mg total) by mouth daily. 02/07/14 02/07/15 Yes Levonne Spiller, MD  fluticasone-salmeterol (ADVAIR HFA) [phone] MCG/ACT inhaler Inhale 2 puffs into the lungs 2 (two) times daily.   Yes Historical Provider, MD  gabapentin (NEURONTIN) 100 MG capsule Take 300 mg by mouth 2 (two) times daily.    Yes Historical Provider, MD  HYDROcodone-acetaminophen (NORCO/VICODIN) 5-325 MG per tablet Take 1-2 tablets by mouth every 6 (six) hours as needed for moderate pain. Patient taking differently: Take 2 tablets by mouth every 4 (four) hours as needed for moderate pain.  04/09/14  Yes Janith Lima, MD  insulin NPH-regular Human (HUMULIN 70/30) (70-30) 100 UNIT/ML injection Inject 90 units with breakfast and 40 units with evening meal. 2 syringes/day 05/04/14  Yes Renato Shin, MD  levothyroxine (SYNTHROID, LEVOTHROID) 75 MCG tablet Take 1 tablet (75 mcg total) by mouth daily before breakfast. 02/12/14  Yes Janith Lima, MD  metoprolol succinate (TOPROL-XL) 50 MG 24 hr tablet Take 1 tablet (50 mg total) by mouth 2 (two) times daily. Take with or immediately following a meal. 05/04/13  Yes Janith Lima, MD  Multiple Vitamins-Minerals (MULTIVITAMIN PO) Take 1 tablet by mouth daily.   Yes Historical Provider, MD  Omega-3 Fatty Acids (FISH OIL PO) Take 1 capsule by mouth daily.   Yes Historical Provider, MD  primidone (MYSOLINE) 50 MG  tablet TAKE THREE TABLETS BY MOUTH TWICE DAILY Patient taking differently: TAKE 100 mg BY MOUTH TWICE DAILY 02/08/14  Yes Janith Lima, MD  ranitidine (ZANTAC) 150 MG tablet Take 150 mg by mouth 2 (two) times daily.    Yes Historical Provider, MD  sertraline (ZOLOFT) 100 MG tablet Take one and one half tablets daily Patient taking differently: Take 100 mg by mouth at bedtime.  02/07/14  Yes Levonne Spiller, MD  glucose blood test strip Use TID  E11.9 04/10/14   Biagio Borg, MD  methocarbamol (ROBAXIN) 500 MG tablet Take 1 tablet (500 mg total) by mouth 2 (two) times daily. 05/12/14   Tanna Furry, MD  predniSONE (DELTASONE) 10 MG tablet 6, 5, 4, 3, 2 then 1 tablet by mouth daily for 6 days total. Patient not taking: Reported on 05/12/2014 04/05/14   Evalee Jefferson, PA-C  predniSONE (DELTASONE) 10 MG tablet Tapered dose 40 mg x 1 day, 30 mg x 2 days, 20 mg x 2 days, then 10 mg x 2 days. Patient not taking: Reported on 04/20/2014 04/05/14   Evalee Jefferson, PA-C   BP 151/77 mmHg  Pulse 99  Temp(Src) 98.3 F (36.8 C) (Oral)  Resp 18  Ht 5\' 4"  (1.626 m)  Wt 206 lb (93.441 kg)  BMI 35.34 kg/m2  SpO2 97% Physical Exam  Constitutional: She is oriented to person, place, and time. She appears well-developed and well-nourished. No distress.  HENT:  Head: Normocephalic.  Eyes: Conjunctivae are normal. Pupils are equal, round, and reactive to light. No scleral icterus.  Neck: Normal range of motion. Neck supple. No thyromegaly present.  Cardiovascular: Normal rate and regular rhythm.  Exam reveals no gallop and no friction rub.   No murmur heard. Pulmonary/Chest: Effort normal and breath sounds normal. No respiratory distress. She has no wheezes. She has no rales.  Abdominal: Soft. Bowel sounds are normal. She exhibits no distension. There is no tenderness. There is no rebound.  Musculoskeletal:  Describes pain to anterior shin to anterior thigh; not radicular  TTP left 5th metatarsal  Neurological: She is alert  and oriented to person,  place, and time. She displays normal reflexes.  Nml symmetric DTRs and sensation  Nml 5/5 strength RLE; 4/5 strength LLE Symmetric resting tremor bilaterally  Skin: Skin is warm and dry. No rash noted.  Psychiatric: She has a normal mood and affect. Her behavior is normal.  Nursing note and vitals reviewed.   ED Course  Procedures   DIAGNOSTIC STUDIES:  Oxygen Saturation is 100% on RA, normal by my interpretation.    COORDINATION OF CARE:  10:27 AM Will check potassium levels and other blood work. Will also administer muscle relaxer. Discussed treatment plan with pt at bedside and pt agreed to plan.  Labs Review Labs Reviewed  BASIC METABOLIC PANEL - Abnormal; Notable for the following:    Sodium 134 (*)    Potassium 3.4 (*)    Glucose, Bld 239 (*)    GFR calc non Af Amer 75 (*)    GFR calc Af Amer 86 (*)    All other components within normal limits  MAGNESIUM    Imaging Review No results found.   EKG Interpretation None      MDM   Final diagnoses:  Pain of lower extremity, unspecified laterality  I personally performed the services described in this documentation, which was scribed in my presence. The recorded information has been reviewed and is accurate.  Patient has a normal neurological exam. Symptoms do not follow a dermatome. Do not feel this represents cauda equina or surgical consultation. Surgical site appears excellent. She has good strength and sensation with exception of her chronic left leg subtle weakness which she describes as normal today. Intact DTRs. Plan is Robaxin, home, continue current pain medication.  Tanna Furry, MD 05/12/14 1322

## 2014-05-17 DIAGNOSIS — T1490 Injury, unspecified: Secondary | ICD-10-CM | POA: Diagnosis not present

## 2014-05-19 ENCOUNTER — Other Ambulatory Visit: Payer: Self-pay | Admitting: Internal Medicine

## 2014-05-25 ENCOUNTER — Ambulatory Visit: Payer: Medicare Other | Admitting: Endocrinology

## 2014-05-30 ENCOUNTER — Encounter: Payer: Self-pay | Admitting: Internal Medicine

## 2014-05-30 ENCOUNTER — Other Ambulatory Visit (INDEPENDENT_AMBULATORY_CARE_PROVIDER_SITE_OTHER): Payer: Medicare Other

## 2014-05-30 ENCOUNTER — Ambulatory Visit (INDEPENDENT_AMBULATORY_CARE_PROVIDER_SITE_OTHER): Payer: Medicare Other | Admitting: Internal Medicine

## 2014-05-30 VITALS — BP 162/86 | HR 109 | Temp 98.3°F | Resp 16 | Wt 214.0 lb

## 2014-05-30 DIAGNOSIS — I10 Essential (primary) hypertension: Secondary | ICD-10-CM

## 2014-05-30 DIAGNOSIS — E876 Hypokalemia: Secondary | ICD-10-CM

## 2014-05-30 DIAGNOSIS — E039 Hypothyroidism, unspecified: Secondary | ICD-10-CM

## 2014-05-30 DIAGNOSIS — E119 Type 2 diabetes mellitus without complications: Secondary | ICD-10-CM

## 2014-05-30 DIAGNOSIS — B354 Tinea corporis: Secondary | ICD-10-CM | POA: Insufficient documentation

## 2014-05-30 LAB — MAGNESIUM: Magnesium: 1.8 mg/dL (ref 1.5–2.5)

## 2014-05-30 LAB — URINALYSIS, ROUTINE W REFLEX MICROSCOPIC
Bilirubin Urine: NEGATIVE
Hgb urine dipstick: NEGATIVE
Ketones, ur: NEGATIVE
Leukocytes, UA: NEGATIVE
Nitrite: NEGATIVE
RBC / HPF: NONE SEEN (ref 0–?)
Specific Gravity, Urine: 1.01 (ref 1.000–1.030)
Total Protein, Urine: NEGATIVE
Urine Glucose: >=1000 — AB
Urobilinogen, UA: 0.2 (ref 0.0–1.0)
pH: 6.5 (ref 5.0–8.0)

## 2014-05-30 LAB — BASIC METABOLIC PANEL
BUN: 8 mg/dL (ref 6–23)
CO2: 28 mEq/L (ref 19–32)
Calcium: 9 mg/dL (ref 8.4–10.5)
Chloride: 99 mEq/L (ref 96–112)
Creatinine, Ser: 0.83 mg/dL (ref 0.40–1.20)
GFR: 73.19 mL/min (ref >60.00)
Glucose, Bld: 279 mg/dL — ABNORMAL HIGH (ref 70–99)
Potassium: 3.9 mEq/L (ref 3.5–5.1)
Sodium: 135 mEq/L (ref 135–145)

## 2014-05-30 LAB — HEMOGLOBIN A1C: Hgb A1c MFr Bld: 8.3 % — ABNORMAL HIGH (ref 4.6–6.5)

## 2014-05-30 LAB — TSH: TSH: 0.8 u[IU]/mL (ref 0.35–4.50)

## 2014-05-30 MED ORDER — DULOXETINE HCL 60 MG PO CPEP: 60.0000 mg | ORAL_CAPSULE | Freq: Every day | ORAL | Status: DC

## 2014-05-30 MED ORDER — KETOCONAZOLE 2 % EX CREA: 1.0000 "application " | TOPICAL_CREAM | Freq: Every day | CUTANEOUS | Status: DC

## 2014-05-30 MED ORDER — TRIAMTERENE-HCTZ 37.5-25 MG PO CAPS: 1.0000 | ORAL_CAPSULE | Freq: Every day | ORAL | Status: DC

## 2014-05-30 NOTE — Assessment & Plan Note (Signed)
She has developed quite a bit of pitting edema, she admits to some dietary indiscretions Will stop the CCB to help with this Will start a diuretic to control the BP and reduce the edema Will check her labs today to screen for secondary causes of HTN and edema

## 2014-05-30 NOTE — Assessment & Plan Note (Signed)
Her blood sugars are not well controlled She has been having trouble with her insulin regimen and she wants to learn more about a sliding scale regimen for BS control Will refer for diabetic education

## 2014-05-30 NOTE — Assessment & Plan Note (Signed)
Her TSH is on the normal range Will remain on the current dose

## 2014-05-30 NOTE — Progress Notes (Signed)
Pre visit review using our clinic review tool, if applicable. No additional management support is needed unless otherwise documented below in the visit note. 

## 2014-05-30 NOTE — Progress Notes (Signed)
Subjective:    Patient ID: [name], female    DOB: 1950-11-26, 64 y.o.   MRN: [id number]  Hypertension This is a chronic problem. The current episode started more than 1 year ago. The problem is unchanged. The problem is controlled. Associated symptoms include malaise/fatigue and peripheral edema. Pertinent negatives include no anxiety, blurred vision, chest pain, headaches, neck pain, orthopnea, palpitations, PND, shortness of breath or sweats. Past treatments include calcium channel blockers. The current treatment provides mild improvement. Compliance problems include exercise and diet.  Hypertensive end-organ damage includes a thyroid problem.  Diabetes Pertinent negatives for hypoglycemia include no headaches or sweats. Associated symptoms include fatigue. Pertinent negatives for diabetes include no blurred vision, no chest pain, no polydipsia, no polyphagia and no polyuria.      Review of Systems  Constitutional: Positive for malaise/fatigue and fatigue. Negative for fever, chills, diaphoresis and appetite change.  HENT: Negative.   Eyes: Negative.  Negative for blurred vision.  Respiratory: Negative.  Negative for cough, choking, chest tightness, shortness of breath and stridor.   Cardiovascular: Negative.  Negative for chest pain, palpitations, orthopnea, leg swelling and PND.  Gastrointestinal: Negative.  Negative for nausea, vomiting, abdominal pain, diarrhea, constipation and blood in stool.  Endocrine: Negative.  Negative for polydipsia, polyphagia and polyuria.  Genitourinary: Negative.   Musculoskeletal: Negative.  Negative for myalgias, back pain, arthralgias and neck pain.  Skin: Positive for rash.       She has an itchy red rash under her rt breast  Allergic/Immunologic: Negative.   Neurological: Negative.  Negative for headaches.  Hematological: Negative.  Negative for adenopathy. Does not bruise/bleed easily.  Psychiatric/Behavioral: Negative.          Objective:   Physical Exam  Constitutional: She is oriented to person, place, and time. She appears well-developed and well-nourished. No distress.  HENT:  Head: Normocephalic and atraumatic.  Mouth/Throat: Oropharynx is clear and moist. No oropharyngeal exudate.  Eyes: Conjunctivae are normal. Right eye exhibits no discharge. Left eye exhibits no discharge. No scleral icterus.  Neck: Normal range of motion. Neck supple. No JVD present. No tracheal deviation present. No thyromegaly present.  Cardiovascular: Normal rate, regular rhythm, normal heart sounds and intact distal pulses.  Exam reveals no gallop and no friction rub.   No murmur heard. Pulmonary/Chest: Effort normal and breath sounds normal. No stridor. No respiratory distress. She has no wheezes. She has no rales. She exhibits deformity. She exhibits no tenderness.    Abdominal: Soft. Bowel sounds are normal. She exhibits no distension. There is no tenderness. There is no rebound and no guarding.  Musculoskeletal: Normal range of motion. She exhibits edema (2+ pitting edema on BLE). She exhibits no tenderness.  Lymphadenopathy:    She has no cervical adenopathy.  Neurological: She is oriented to person, place, and time.  Skin: Skin is warm and dry. No rash noted. She is not diaphoretic. No erythema. No pallor.  Vitals reviewed.    Lab Results  Component Value Date   WBC 7.5 04/23/2014   HGB 14.1 04/23/2014   HCT 40.6 04/23/2014   PLT 198 04/23/2014   GLUCOSE 239* 05/12/2014   CHOL 198 02/12/2014   TRIG 180.0* 02/12/2014   HDL 68.20 02/12/2014   LDLCALC 94 02/12/2014   ALT 27 01/30/2014   AST 24 01/30/2014   NA 134* 05/12/2014   K 3.4* 05/12/2014   CL 99 05/12/2014   CREATININE 0.81 05/12/2014   BUN 9 05/12/2014   CO2 29 05/12/2014  TSH 1.690 01/22/2014   INR 1.0 02/27/2008   HGBA1C 7.0* 01/22/2014   MICROALBUR 0.7 10/27/2013       Assessment & Plan:

## 2014-05-30 NOTE — Patient Instructions (Signed)

## 2014-05-30 NOTE — Assessment & Plan Note (Signed)
Will treat with ketoconazole cream 

## 2014-05-30 NOTE — Assessment & Plan Note (Addendum)
I will recheck her K+ level and will treat if needed, will also check for Mg++ defic Will start dyazide to help with this as well

## 2014-05-31 ENCOUNTER — Telehealth: Payer: Self-pay | Admitting: Internal Medicine

## 2014-05-31 NOTE — Telephone Encounter (Signed)
Would like a call back in regards to lab results.

## 2014-05-31 NOTE — Telephone Encounter (Signed)
Pt aware of results, she is going to call the diabetic educator today

## 2014-06-02 ENCOUNTER — Telehealth: Payer: Self-pay | Admitting: Pulmonary Disease

## 2014-06-02 NOTE — Telephone Encounter (Signed)
C/o feet swelling for last one week.  Saw primary last week and changed norvasc to dyazide.   I asked her to discuss this with him She had some increased sob today transiently, and resolved with albuterol.  She is back to baseline currently.  I asked her to stay on advair, and let us know if she is having breathing issues.

## 2014-06-04 ENCOUNTER — Encounter (HOSPITAL_COMMUNITY): Payer: Self-pay | Admitting: Emergency Medicine

## 2014-06-04 ENCOUNTER — Emergency Department (HOSPITAL_COMMUNITY)
Admission: EM | Admit: 2014-06-04 | Discharge: 2014-06-04 | Disposition: A | Discharge status: Home / Self Care | Payer: Medicare Other | Attending: Emergency Medicine | Admitting: Emergency Medicine

## 2014-06-04 DIAGNOSIS — Z79899 Other long term (current) drug therapy: Secondary | ICD-10-CM | POA: Insufficient documentation

## 2014-06-04 DIAGNOSIS — E039 Hypothyroidism, unspecified: Secondary | ICD-10-CM | POA: Insufficient documentation

## 2014-06-04 DIAGNOSIS — I499 Cardiac arrhythmia, unspecified: Secondary | ICD-10-CM | POA: Insufficient documentation

## 2014-06-04 DIAGNOSIS — Z794 Long term (current) use of insulin: Secondary | ICD-10-CM | POA: Insufficient documentation

## 2014-06-04 DIAGNOSIS — K219 Gastro-esophageal reflux disease without esophagitis: Secondary | ICD-10-CM | POA: Diagnosis not present

## 2014-06-04 DIAGNOSIS — F419 Anxiety disorder, unspecified: Secondary | ICD-10-CM | POA: Insufficient documentation

## 2014-06-04 DIAGNOSIS — G8929 Other chronic pain: Secondary | ICD-10-CM | POA: Diagnosis not present

## 2014-06-04 DIAGNOSIS — J45909 Unspecified asthma, uncomplicated: Secondary | ICD-10-CM | POA: Diagnosis not present

## 2014-06-04 DIAGNOSIS — K5900 Constipation, unspecified: Secondary | ICD-10-CM | POA: Diagnosis not present

## 2014-06-04 DIAGNOSIS — M79605 Pain in left leg: Secondary | ICD-10-CM | POA: Insufficient documentation

## 2014-06-04 DIAGNOSIS — M2550 Pain in unspecified joint: Secondary | ICD-10-CM | POA: Diagnosis not present

## 2014-06-04 DIAGNOSIS — I10 Essential (primary) hypertension: Secondary | ICD-10-CM | POA: Insufficient documentation

## 2014-06-04 DIAGNOSIS — M79604 Pain in right leg: Secondary | ICD-10-CM | POA: Insufficient documentation

## 2014-06-04 DIAGNOSIS — Z7951 Long term (current) use of inhaled steroids: Secondary | ICD-10-CM | POA: Diagnosis not present

## 2014-06-04 DIAGNOSIS — E119 Type 2 diabetes mellitus without complications: Secondary | ICD-10-CM | POA: Insufficient documentation

## 2014-06-04 DIAGNOSIS — Z7982 Long term (current) use of aspirin: Secondary | ICD-10-CM | POA: Diagnosis not present

## 2014-06-04 DIAGNOSIS — G40909 Epilepsy, unspecified, not intractable, without status epilepticus: Secondary | ICD-10-CM | POA: Insufficient documentation

## 2014-06-04 DIAGNOSIS — F329 Major depressive disorder, single episode, unspecified: Secondary | ICD-10-CM | POA: Diagnosis not present

## 2014-06-04 DIAGNOSIS — Z87891 Personal history of nicotine dependence: Secondary | ICD-10-CM | POA: Diagnosis not present

## 2014-06-04 MED ORDER — KETOROLAC TROMETHAMINE 30 MG/ML IJ SOLN
30.0000 mg | Freq: Once | INTRAMUSCULAR | Status: AC
  Administered 2014-06-04: 30 mg via INTRAMUSCULAR
  Filled 2014-06-04: qty 1

## 2014-06-04 MED ORDER — DIAZEPAM 5 MG/ML IJ SOLN
5.0000 mg | Freq: Once | INTRAMUSCULAR | Status: AC
  Administered 2014-06-04: 5 mg via INTRAMUSCULAR
  Filled 2014-06-04: qty 2

## 2014-06-04 NOTE — ED Notes (Signed)
Pt c/o chronic bilateral leg pain. States it has increased of the last couple of days.

## 2014-06-04 NOTE — Discharge Instructions (Signed)
Please talk to your primary care doctor or Dr Vertell Limber about your leg pain to decide what medication you need.

## 2014-06-04 NOTE — ED Notes (Signed)
Pt reports taking her last dose of Vicodin (10/325 mg) at 23:15 last evening.

## 2014-06-04 NOTE — ED Provider Notes (Addendum)
CSN: [id number]     Arrival date & time 06/04/14  0129 History   First MD Initiated Contact with Patient 06/04/14 [phone]     Chief Complaint  Patient presents with  . Leg Pain     (Consider location/radiation/quality/duration/timing/severity/associated sxs/prior Treatment) HPI  Patient initially told me she started having severe bilateral leg pain after she had her back surgery a month ago. However on further discussion she has had the pain for over 6 months. She was seen by Dr. Vertell Limber, her neurosurgeon 2 weeks ago. He started her on tizanidine and Vicodin which she states she's taking however her pain is getting worse. She states she was seen by [name], neurology and had a nerve conduction test done which showed she did not have peripheral neuropathy. She states that was done about 2 months ago. She states Dr. Vertell Limber does not feel the pain is due to her back surgery. She states she was having "pinched nerve" pain prior to her back surgery and that has resolved. She states the pain is in her whole leg from the hip to her toes.  PCP Dr Gordy Savers Neurosurgery Dr Vertell Limber Neurology Dr Tat  Past Medical History  Diagnosis Date  . 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 [name], residual palpitations  . Vocal cord dysfunction   . Tremor, essential     takes Mysoline and Neurontin daily.  . Allergic rhinitis     uses Nasonex daily as needed  . Low sodium syndrome   . Chronic back pain     reason unknown  . Lumbar radiculopathy   . DDD (degenerative disc disease), lumbar   . Esophageal reflux     takes Zantac daily  . Hypertension     takes Losartan daily  . Anxiety     takes Ativan daily as needed  . Hypothyroidism     takes Synthroid daily  . Depression     takes Zoloft daily as well as Cymbalta  . Diabetes mellitus     takes NOvolin daily  . Asthma     Albuterol inhaler and Neb daily as needed  . History of bronchitis Dec 2014  . Seizures  04-05-13    one time ran out of Primidone and had stopped it cold [place]  . Weakness     in left arm  . Joint pain   . Joint swelling     left ankle  . Dysphagia   . Constipation   . Allergy to angiotensin receptor blockers (ARB) 01/22/2014    Angioedema  . 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 Rayann Heman 2007   Past Surgical History  Procedure Laterality Date  . Tonsillectomy    . Right breast cyst      benign  . Left ankle ligament repair      x 2  . Left elbow repair    . Partial hysterectomy    . Cholecystectomy    . Cesarean section    . Ablation for avnrt    . Colonoscopy  01/16/2004    QIH:KVQQVZ rectum/colon  . Esophagogastroduodenoscopy (egd) with esophageal dilation  04/03/2002    DGL:OVFIEPPI'R ring, otherwise normal esophagus, status post dilation with 54 F/Normal stomach  . Bartholin gland cyst excision      x 2  . Esophagogastroduodenoscopy (egd) with esophageal dilation N/A 11/08/2012    Procedure: ESOPHAGOGASTRODUODENOSCOPY (EGD) WITH ESOPHAGEAL DILATION;  Surgeon: Daneil Dolin, MD;  Location: [facility];  Service: Endoscopy;  Laterality: N/A;  11:30  . Subthalamic stimulator insertion Bilateral 12/04/2013    Procedure: Bilateral Deep brain stimulator placement;  Surgeon: [name], MD;  Location: [place] [facility];  Service: Neurosurgery;  Laterality: Bilateral;  Bilateral Deep brain stimulator placement  . Pulse generator implant Bilateral 12/15/2013    Procedure: BILATERAL PULSE GENERATOR IMPLANT;  Surgeon: [name], MD;  Location: [place] [facility];  Service: Neurosurgery;  Laterality: Bilateral;  BILATERAL  . Maximum access (mas)posterior lumbar interbody fusion (plif) 1 level N/A 04/24/2014    Procedure: Lumbar four-five Maximum access Surgery posterior lumbar interbody fusion;  Surgeon: [name], MD;  Location: [place] [facility];  Service: Neurosurgery;  Laterality: N/A;  Lumbar four-five Maximum access Surgery posterior lumbar interbody fusion   Family  History  Problem Relation Age of Onset  . Lung cancer Father     DIED AGE 59 LUNG CA  . Alcohol abuse Father   . Anxiety disorder Father   . Depression Father   . COPD Mother     DIED AGE 67,MRSA,COPD,PNEUMONIA  . Pneumonia Mother     DIED AGE 67,MRSA,COPD,PNEUMONIA  . Supraventricular tachycardia Mother   . Anxiety disorder Mother   . Depression Mother   . Alcohol abuse Mother   . COPD Brother     AGE 24  . Heart disease Sister     DIED AGE 68, SMOKER,?HEART  . Colon cancer Neg Hx    History  Substance Use Topics  . Smoking status: Former Smoker -- 0.30 packs/day for 10 years    Types: Cigarettes    Quit date: 04/23/1993  . Smokeless tobacco: Never Used     Comment: quit smoking 25+yrs ago  . Alcohol Use: No     Comment: no alcohol in 41yrs   Lives at home Lives with spouse  OB History    No data available     Review of Systems  All other systems reviewed and are negative.     Allergies  Losartan; Metformin and related; Morphine and related; and Oxycodone-acetaminophen  Home Medications   Prior to Admission medications   Medication Sig Start Date End Date Taking? Authorizing Provider  albuterol (PROAIR HFA) 108 (90 BASE) MCG/ACT inhaler Inhale 2 puffs into the lungs every 6 (six) hours as needed for wheezing or shortness of breath. 08/03/13   Tammy S Parrett, NP  albuterol (PROVENTIL) (2.5 MG/3ML) 0.083% nebulizer solution Take 3 mLs (2.5 mg total) by nebulization every 6 (six) hours as needed for wheezing or shortness of breath. 02/01/14   Deneise Lever, MD  aspirin EC 81 MG tablet Take 81 mg by mouth every morning.     Historical Provider, MD  DULoxetine (CYMBALTA) 60 MG capsule Take 1 capsule (60 mg total) by mouth daily. 05/30/14 05/30/15  Janith Lima, MD  fluticasone-salmeterol (ADVAIR HFA) [id number] MCG/ACT inhaler Inhale 2 puffs into the lungs 2 (two) times daily.    Historical Provider, MD  gabapentin (NEURONTIN) 100 MG capsule Take 300 mg by mouth 2  (two) times daily.     Historical Provider, MD  glucose blood test strip Use TID  E11.9 04/10/14   Biagio Borg, MD  HYDROcodone-acetaminophen [facility]) 10-325 MG per tablet Take 1 tablet by mouth every 6 (six) hours as needed.    Historical Provider, MD  insulin NPH-regular Human (HUMULIN 70/30) (70-30) 100 UNIT/ML injection Inject 90 units with breakfast and 40 units with evening meal. 2 syringes/day 05/04/14  Renato Shin, MD  ketoconazole (NIZORAL) 2 % cream Apply 1 application topically daily. 05/30/14   Janith Lima, MD  levothyroxine (SYNTHROID, LEVOTHROID) 75 MCG tablet Take 1 tablet (75 mcg total) by mouth daily before breakfast. 02/12/14   Janith Lima, MD  methocarbamol (ROBAXIN) 500 MG tablet Take 1 tablet (500 mg total) by mouth 2 (two) times daily. 05/12/14   Tanna Furry, MD  metoprolol succinate (TOPROL-XL) 50 MG 24 hr tablet TAKE ONE TABLET BY MOUTH TWICE DAILY, TAKE WITH A MEAL OR IMMEDIATELY FOLLOWING A MEAL. 05/20/14   Janith Lima, MD  Multiple Vitamins-Minerals (MULTIVITAMIN PO) Take 1 tablet by mouth daily.    Historical Provider, MD  Omega-3 Fatty Acids (FISH OIL PO) Take 1 capsule by mouth daily.    Historical Provider, MD  primidone (MYSOLINE) 50 MG tablet TAKE THREE TABLETS BY MOUTH TWICE DAILY Patient taking differently: TAKE 100 mg BY MOUTH TWICE DAILY 02/08/14   Janith Lima, MD  ranitidine (ZANTAC) 150 MG tablet Take 150 mg by mouth 2 (two) times daily.     Historical Provider, MD  sertraline (ZOLOFT) 100 MG tablet Take one and one half tablets daily Patient taking differently: Take 100 mg by mouth at bedtime.  02/07/14   Levonne Spiller, MD  tiZANidine (ZANAFLEX) 4 MG tablet Take 4 mg by mouth 3 (three) times daily.    Historical Provider, MD  triamterene-hydrochlorothiazide (DYAZIDE) 37.5-25 MG per capsule Take 1 each (1 capsule total) by mouth daily. 05/30/14   Janith Lima, MD   BP 147/79 mmHg  Pulse 88  Temp(Src) 99.2 F (37.3 C) (Oral)  Resp 24  Ht 5' 4.5"  (1.638 m)  Wt 213 lb (96.616 kg)  BMI 36.01 kg/m2  SpO2 99%  Vital signs normal   Physical Exam  Constitutional: She is oriented to person, place, and time. She appears well-developed and well-nourished.  Non-toxic appearance. She does not appear ill. No distress.  HENT:  Head: Normocephalic and atraumatic.  Right Ear: External ear normal.  Left Ear: External ear normal.  Nose: Nose normal. No mucosal edema or rhinorrhea.  Mouth/Throat: Mucous membranes are normal. No dental abscesses or uvula swelling.  Eyes: Conjunctivae and EOM are normal. Pupils are equal, round, and reactive to light.  Neck: Full passive range of motion without pain.  Pulmonary/Chest: Effort normal. No respiratory distress. She has no rhonchi. She exhibits no crepitus.  Abdominal: Normal appearance.  Musculoskeletal: Normal range of motion. She exhibits no edema or tenderness.  Moves all extremities well. Patient has diffuse pain to palpation of her lower extremities bilaterally. There is no edema noted. There is no focal tenderness found.  Neurological: She is alert and oriented to person, place, and time. She has normal strength. No cranial nerve deficit.  Patient has a mild intention tremor  Skin: Skin is warm, dry and intact. No rash noted. No erythema. No pallor.  Psychiatric: She has a normal mood and affect. Her speech is normal and behavior is normal. Her mood appears not anxious.  Nursing note and vitals reviewed.   ED Course  Procedures (including critical care time)  Medications  ketorolac (TORADOL) 30 MG/ML injection 30 mg (30 mg Intramuscular Given 06/04/14 0512)  diazepam (VALIUM) injection 5 mg (5 mg Intramuscular Given 06/04/14 7124)   Review of the [place] shows patient has been getting hydrocodone 5/325 on a regular basis between her primary care doctor and her neurosurgeon. She received #100 tablets on January 4, #60  tablets on January 21, and on February 10 she received #60  hydrocodone 10/325. She has received 7 narcotic prescriptions since October 7.  Labs Review Labs Reviewed - No data to display  Imaging Review No results found.   EKG Interpretation None      MDM   Final diagnoses:  Leg pain, diffuse, left  Leg pain, diffuse, right    Plan discharge  Rolland Porter, MD, Alanson Aly, MD 06/04/14 9622  Janice Norrie, MD 06/04/14 [phone]

## 2014-06-04 NOTE — ED Notes (Signed)
MD at bedside. 

## 2014-06-04 NOTE — ED Notes (Signed)
Pt able to ambulate from wheelchair to bed with stand-by assist.

## 2014-06-07 ENCOUNTER — Ambulatory Visit (INDEPENDENT_AMBULATORY_CARE_PROVIDER_SITE_OTHER)
Admission: RE | Admit: 2014-06-07 | Discharge: 2014-06-07 | Disposition: A | Discharge status: Home / Self Care | Payer: Medicare Other | Source: Ambulatory Visit | Attending: Internal Medicine | Admitting: Internal Medicine

## 2014-06-07 ENCOUNTER — Ambulatory Visit (INDEPENDENT_AMBULATORY_CARE_PROVIDER_SITE_OTHER): Payer: Medicare Other | Admitting: Internal Medicine

## 2014-06-07 ENCOUNTER — Encounter: Payer: Self-pay | Admitting: Internal Medicine

## 2014-06-07 VITALS — BP 120/68 | HR 95 | Temp 98.3°F | Resp 16 | Ht 64.5 in | Wt 213.0 lb

## 2014-06-07 DIAGNOSIS — S79922A Unspecified injury of left thigh, initial encounter: Secondary | ICD-10-CM

## 2014-06-07 DIAGNOSIS — S79929A Unspecified injury of unspecified thigh, initial encounter: Secondary | ICD-10-CM | POA: Insufficient documentation

## 2014-06-07 NOTE — Assessment & Plan Note (Signed)
Exam and plain films are normal This is consistent with contusion/strain She will cont the current meds for pain

## 2014-06-07 NOTE — Progress Notes (Signed)
Pre visit review using our clinic review tool, if applicable. No additional management support is needed unless otherwise documented below in the visit note. 

## 2014-06-07 NOTE — Progress Notes (Signed)
Subjective:    Patient ID: [name], female    DOB: [birth year], 64 y.o.   MRN: [id number]  HPI  She hurt her left thigh 2 days ago when she fell, her left leg felt weak and gave away, she has had left leg weakness and instability since she had back surgery 2 months ago. Her NS is away of the left leg weakness and she is doing therapy for this.  Review of Systems  Constitutional: Negative for fever, chills, diaphoresis, appetite change and fatigue.  HENT: Negative.   Eyes: Negative.   Respiratory: Negative.  Negative for cough, choking, chest tightness, shortness of breath and stridor.   Cardiovascular: Negative.  Negative for chest pain, palpitations and leg swelling.  Gastrointestinal: Negative.  Negative for nausea, vomiting, abdominal pain, diarrhea, constipation and blood in stool.  Endocrine: Negative.   Genitourinary: Negative.   Musculoskeletal: Positive for arthralgias. Negative for myalgias, back pain, joint swelling, gait problem, neck pain and neck stiffness.  Skin: Negative.   Allergic/Immunologic: Negative.   Neurological: Positive for weakness (in her left leg).  Hematological: Negative.  Negative for adenopathy. Does not bruise/bleed easily.  Psychiatric/Behavioral: Negative.        Objective:   Physical Exam  Constitutional: She is oriented to person, place, and time. She appears well-developed and well-nourished. No distress.  HENT:  Head: Normocephalic and atraumatic.  Mouth/Throat: Oropharynx is clear and moist. No oropharyngeal exudate.  Eyes: Conjunctivae are normal. Right eye exhibits no discharge. Left eye exhibits no discharge. No scleral icterus.  Neck: Normal range of motion. Neck supple. No JVD present. No tracheal deviation present. No thyromegaly present.  Cardiovascular: Normal rate, regular rhythm, normal heart sounds and intact distal pulses.  Exam reveals no gallop and no friction rub.   No murmur heard. Pulmonary/Chest: Effort normal and  breath sounds normal. No stridor. No respiratory distress. She has no wheezes. She has no rales. She exhibits no tenderness.  Abdominal: Soft. Bowel sounds are normal. She exhibits no distension and no mass. There is no tenderness. There is no rebound and no guarding.  Musculoskeletal: Normal range of motion. She exhibits no edema or tenderness.       Right knee: Normal.       Left knee: Normal.       Left upper leg: Normal. She exhibits no tenderness, no bony tenderness, no swelling, no edema, no deformity and no laceration.  Lymphadenopathy:    She has no cervical adenopathy.  Neurological: She is oriented to person, place, and time. She has normal strength. She displays no atrophy and no tremor. No cranial nerve deficit or sensory deficit. She exhibits abnormal muscle tone. She displays a negative Romberg sign. She displays no seizure activity.  LLE is weak  Skin: Skin is warm and dry. No rash noted. She is not diaphoretic. No erythema. No pallor.  Vitals reviewed.    Lab Results  Component Value Date   WBC 7.5 04/23/2014   HGB 14.1 04/23/2014   HCT 40.6 04/23/2014   PLT 198 04/23/2014   GLUCOSE 279* 05/30/2014   CHOL 198 02/12/2014   TRIG 180.0* 02/12/2014   HDL 68.20 02/12/2014   LDLCALC 94 02/12/2014   ALT 27 01/30/2014   AST 24 01/30/2014   NA 135 05/30/2014   K 3.9 05/30/2014   CL 99 05/30/2014   CREATININE 0.83 05/30/2014   BUN 8 05/30/2014   CO2 28 05/30/2014   TSH 0.80 05/30/2014   INR 1.0 02/27/2008  HGBA1C 8.3* 05/30/2014   MICROALBUR 0.7 10/27/2013       Assessment & Plan:

## 2014-06-07 NOTE — Patient Instructions (Signed)
Contusion °A contusion is a deep bruise. Contusions are the result of an injury that caused bleeding under the skin. The contusion may turn blue, purple, or yellow. Minor injuries will give you a painless contusion, but more severe contusions may stay painful and swollen for a few weeks.  °CAUSES  °A contusion is usually caused by a blow, trauma, or direct force to an area of the body. °SYMPTOMS  °· Swelling and redness of the injured area. °· Bruising of the injured area. °· Tenderness and soreness of the injured area. °· Pain. °DIAGNOSIS  °The diagnosis can be made by taking a history and physical exam. An X-ray, CT scan, or MRI may be needed to determine if there were any associated injuries, such as fractures. °TREATMENT  °Specific treatment will depend on what area of the body was injured. In general, the best treatment for a contusion is resting, icing, elevating, and applying cold compresses to the injured area. Over-the-counter medicines may also be recommended for pain control. Ask your caregiver what the best treatment is for your contusion. °HOME CARE INSTRUCTIONS  °· Put ice on the injured area. °¨ Put ice in a plastic bag. °¨ Place a towel between your skin and the bag. °¨ Leave the ice on for 15-20 minutes, 3-4 times a day, or as directed by your health care provider. °· Only take over-the-counter or prescription medicines for pain, discomfort, or fever as directed by your caregiver. Your caregiver may recommend avoiding anti-inflammatory medicines (aspirin, ibuprofen, and naproxen) for 48 hours because these medicines may increase bruising. °· Rest the injured area. °· If possible, elevate the injured area to reduce swelling. °SEEK IMMEDIATE MEDICAL CARE IF:  °· You have increased bruising or swelling. °· You have pain that is getting worse. °· Your swelling or pain is not relieved with medicines. °MAKE SURE YOU:  °· Understand these instructions. °· Will watch your condition. °· Will get help right  away if you are not doing well or get worse. °Document Released: 12/31/2004 Document Revised: 03/28/2013 Document Reviewed: 01/26/2011 °[employer]® Patient Information ©2015 [employer]. This information is not intended to replace advice given to you by your health care provider. Make sure you discuss any questions you have with your health care provider. ° °

## 2014-06-08 ENCOUNTER — Telehealth: Payer: Self-pay | Admitting: Internal Medicine

## 2014-06-08 NOTE — Telephone Encounter (Signed)
Pt called in and wanted to know the results of the her xray that she had yesterday?

## 2014-06-11 ENCOUNTER — Encounter: Payer: Medicare Other | Attending: Internal Medicine | Admitting: Nutrition

## 2014-06-11 ENCOUNTER — Encounter: Payer: Self-pay | Admitting: Nutrition

## 2014-06-11 VITALS — Ht 64.0 in

## 2014-06-11 DIAGNOSIS — Z794 Long term (current) use of insulin: Secondary | ICD-10-CM | POA: Insufficient documentation

## 2014-06-11 DIAGNOSIS — E118 Type 2 diabetes mellitus with unspecified complications: Secondary | ICD-10-CM | POA: Diagnosis not present

## 2014-06-11 DIAGNOSIS — Z713 Dietary counseling and surveillance: Secondary | ICD-10-CM | POA: Insufficient documentation

## 2014-06-11 DIAGNOSIS — IMO0002 Reserved for concepts with insufficient information to code with codable children: Secondary | ICD-10-CM

## 2014-06-11 DIAGNOSIS — E1165 Type 2 diabetes mellitus with hyperglycemia: Secondary | ICD-10-CM

## 2014-06-11 NOTE — Progress Notes (Signed)
  Medical Nutrition Therapy:  Appt start time: 5573 end time:  1630.  Assessment:  Primary concerns today: Diabetes. Lives with her husband. Her husband does all the cooking and shopping. She comes in a wheelchair. Unable to stand due to recent back surgery. Has a tremor and disconnected speech. Denies history of stroke. PMH:  limited. Most recent A1C > 8%. Meter log avg: 245 mg/cl. Taking 90 units 70/30 in am and 40 units 70/30 in pm. Allergic to Metformin.  Preferred Learning Style:    Auditory  Visual  Hands on   Learning Readiness:   Not ready   Ready  Change in progress   MEDICATIONS: See list  DIETARY INTAKE:  24-hr recall:  B ( AM): Egg Biscuit  nk ( AM): none  L ( PM): nutrition site meal,  Snk ( PM): none usually D ( PM): [place] stew and pimento cheese sandwich on wheat, Diet CF Coke, Unsweet tea or coffee Snk ( PM): fruit sometimes Beverages: water, diet sods  Usual physical activity: limited due to being in wheel chair.   Estimated energy needs: 1200 calories 135 g carbohydrates 90 g protein 33 g fat  Progress Towards Goal(s):  In progress.   Nutritional Diagnosis:  NB-1.1 Food and nutrition-related knowledge deficit As related to Diabetes.  As evidenced by A1C > 8%.    Intervention:  Nutrition counseling and diabetes education provided on diet, meal planning, CHO counting, My Plate,  target ranges for blood sugars, treatment of hyper/hypoglcyemia and symptoms, and complications from DM .  Goals:  Follow Diabetes Meal Plan as instructed  Eat 3 meals daily   Increase water intake to 4-5 bottles of water per day.  Increase fresh fruits and vegetables daily.  Avoid cakes, cookies, pies, sweets, junk food, sodas,diet sodas and juices.  Avoid snacks between meals unless having a low blood sugar   Limit carbohydrate intake to 30-45  grams carbohydrate/meals  Monitor glucose levels in am and at bedtime daily.  Talk to PCP about referral to  Dr. Dorris Fetch Endocrinologist to help with DM management  Bring glucose log to your next nutrition visit  Get A1C down to 7% in three months.    Teaching Method Utilized:  Visual Auditory Hands on  Handouts given during visit include: Plate MethodCarb Counting   Food Label handouts Meal Plan Card  Barriers to learning/adherence to lifestyle change: limited mobility, in wheelchair and ? PMH  Demonstrated degree of understanding via:  Teach Back   Monitoring/Evaluation:  Dietary intake, exercise, meal planning, SBG, and body weight in 1 month(s).

## 2014-06-11 NOTE — Addendum Note (Signed)
Addended by: Janith Lima on: 06/11/2014 03:58 PM   Modules accepted: Level of Service

## 2014-06-11 NOTE — Patient Instructions (Signed)
Goals:  Follow Diabetes Meal Plan as instructed  Eat 3 meals daily   Increase water intake to 4-5 bottles of water per day.  Increase fresh fruits and vegetables daily.  Avoid cakes, cookies, pies, sweets, junk food, sodas,diet sodas and juices.  Avoid snacks between meals unless having a low blood sugar   Limit carbohydrate intake to 30-45  grams carbohydrate/meals  Monitor glucose levels in am and at bedtime daily.  Talk to PCP about referral to Dr. Dorris Fetch Endocrinologist to help with DM management  Bring glucose log to your next nutrition visit  Get A1C down to 7% in three months.

## 2014-06-13 ENCOUNTER — Telehealth: Payer: Self-pay | Admitting: *Deleted

## 2014-06-13 DIAGNOSIS — E119 Type 2 diabetes mellitus without complications: Secondary | ICD-10-CM

## 2014-06-13 NOTE — Telephone Encounter (Signed)
Let msg on triage stating needing md to send a referral to Diabetic Specialist Dr. Dorris Fetch in [place]. There # [phone]...[name]

## 2014-06-19 ENCOUNTER — Ambulatory Visit: Payer: Self-pay | Admitting: Internal Medicine

## 2014-06-20 ENCOUNTER — Ambulatory Visit: Payer: Self-pay | Admitting: Internal Medicine

## 2014-06-29 DIAGNOSIS — T1490 Injury, unspecified: Secondary | ICD-10-CM | POA: Diagnosis not present

## 2014-07-02 ENCOUNTER — Ambulatory Visit (INDEPENDENT_AMBULATORY_CARE_PROVIDER_SITE_OTHER): Payer: Medicare Other | Admitting: Internal Medicine

## 2014-07-02 ENCOUNTER — Encounter: Payer: Self-pay | Admitting: Internal Medicine

## 2014-07-02 VITALS — BP 120/70 | HR 88 | Temp 98.3°F | Resp 16 | Ht 64.5 in | Wt 204.0 lb

## 2014-07-02 VITALS — BP 112/62 | HR 92 | Ht 64.5 in | Wt 204.2 lb

## 2014-07-02 DIAGNOSIS — J4520 Mild intermittent asthma, uncomplicated: Secondary | ICD-10-CM

## 2014-07-02 DIAGNOSIS — I10 Essential (primary) hypertension: Secondary | ICD-10-CM | POA: Diagnosis not present

## 2014-07-02 DIAGNOSIS — J309 Allergic rhinitis, unspecified: Secondary | ICD-10-CM

## 2014-07-02 DIAGNOSIS — IMO0001 Reserved for inherently not codable concepts without codable children: Secondary | ICD-10-CM

## 2014-07-02 DIAGNOSIS — K219 Gastro-esophageal reflux disease without esophagitis: Secondary | ICD-10-CM

## 2014-07-02 DIAGNOSIS — E038 Other specified hypothyroidism: Secondary | ICD-10-CM | POA: Diagnosis not present

## 2014-07-02 DIAGNOSIS — E119 Type 2 diabetes mellitus without complications: Secondary | ICD-10-CM

## 2014-07-02 DIAGNOSIS — J302 Other seasonal allergic rhinitis: Secondary | ICD-10-CM

## 2014-07-02 DIAGNOSIS — J3089 Other allergic rhinitis: Principal | ICD-10-CM

## 2014-07-02 MED ORDER — CANAGLIFLOZIN 300 MG PO TABS: 300.0000 mg | ORAL_TABLET | Freq: Every day | ORAL | Status: DC

## 2014-07-02 MED ORDER — INSULIN GLARGINE 300 UNIT/ML ~~LOC~~ SOPN: 50.0000 [IU] | PEN_INJECTOR | Freq: Every day | SUBCUTANEOUS | Status: DC

## 2014-07-02 MED ORDER — FLUTICASONE-SALMETEROL 115-21 MCG/ACT IN AERO: 2.0000 | INHALATION_SPRAY | Freq: Two times a day (BID) | RESPIRATORY_TRACT | Status: DC

## 2014-07-02 MED ORDER — INSULIN LISPRO 200 UNIT/ML ~~LOC~~ SOPN: 5.0000 [IU] | PEN_INJECTOR | Freq: Three times a day (TID) | SUBCUTANEOUS | Status: DC

## 2014-07-02 NOTE — Progress Notes (Signed)
Pre visit review using our clinic review tool, if applicable. No additional management support is needed unless otherwise documented below in the visit note. 

## 2014-07-02 NOTE — Progress Notes (Signed)
Subjective:    Patient ID: [name], female    DOB: 05-25-1950, 64 y.o.   MRN: [id number]  HPI Comments: Her blood sugars have not been well controlled, consistently over 200. She wants to change her regimen for blood sugar control. She saw a diabetic educator and was told that she was using an old type of insulin that needed to be changed to a newer insulin regimen - she request toujeo.  Diabetes She presents for her follow-up diabetic visit. She has type 2 diabetes mellitus. Her disease course has been stable. There are no hypoglycemic associated symptoms. Associated symptoms include polydipsia and polyphagia. Pertinent negatives for diabetes include no blurred vision, no chest pain, no fatigue, no foot paresthesias, no foot ulcerations, no polyuria, no visual change, no weakness and no weight loss. There are no hypoglycemic complications. Symptoms are stable. There are no diabetic complications. Current diabetic treatment includes intensive insulin program and insulin injections. She is compliant with treatment most of the time. Her weight is stable. She never participates in exercise. There is no change in her home blood glucose trend. Her breakfast blood glucose range is generally >200 mg/dl. Her lunch blood glucose range is generally >200 mg/dl. Her dinner blood glucose range is generally >200 mg/dl. Her highest blood glucose is >200 mg/dl. Her overall blood glucose range is >200 mg/dl. An ACE inhibitor/angiotensin II receptor blocker is contraindicated. She does not see a podiatrist.Eye exam is not current.      Review of Systems  Constitutional: Negative.  Negative for fever, chills, weight loss, diaphoresis, appetite change and fatigue.  HENT: Negative.   Eyes: Negative.  Negative for blurred vision.  Respiratory: Negative.  Negative for cough, choking, chest tightness, shortness of breath and stridor.   Cardiovascular: Negative.  Negative for chest pain, palpitations and leg  swelling.  Gastrointestinal: Negative.  Negative for nausea, vomiting, abdominal pain, diarrhea, constipation and blood in stool.  Endocrine: Positive for polydipsia and polyphagia. Negative for polyuria.  Genitourinary: Negative.   Musculoskeletal: Positive for back pain and arthralgias. Negative for myalgias, joint swelling, gait problem, neck pain and neck stiffness.  Skin: Negative.   Allergic/Immunologic: Negative.   Neurological: Negative.  Negative for weakness.  Hematological: Negative.  Negative for adenopathy. Does not bruise/bleed easily.  Psychiatric/Behavioral: Negative.        Objective:   Physical Exam  Constitutional: She is oriented to person, place, and time. She appears well-developed and well-nourished. No distress.  HENT:  Head: Normocephalic and atraumatic.  Mouth/Throat: Oropharynx is clear and moist. No oropharyngeal exudate.  Eyes: Conjunctivae are normal. Right eye exhibits no discharge. Left eye exhibits no discharge. No scleral icterus.  Neck: Normal range of motion. Neck supple. No JVD present. No tracheal deviation present. No thyromegaly present.  Cardiovascular: Normal rate, regular rhythm, normal heart sounds and intact distal pulses.  Exam reveals no gallop and no friction rub.   No murmur heard. Pulmonary/Chest: Effort normal and breath sounds normal. No stridor. No respiratory distress. She has no wheezes. She has no rales. She exhibits no tenderness.  Abdominal: Soft. Bowel sounds are normal. She exhibits no distension and no mass. There is no tenderness. There is no rebound and no guarding.  Musculoskeletal: Normal range of motion. She exhibits no edema or tenderness.  Lymphadenopathy:    She has no cervical adenopathy.  Neurological: She is oriented to person, place, and time.  Skin: Skin is warm and dry. No rash noted. She is not diaphoretic. No erythema. No  pallor.  Psychiatric: She has a normal mood and affect. Her behavior is normal. Judgment  and thought content normal.  Vitals reviewed.    Lab Results  Component Value Date   WBC 7.5 04/23/2014   HGB 14.1 04/23/2014   HCT 40.6 04/23/2014   PLT 198 04/23/2014   GLUCOSE 279* 05/30/2014   CHOL 198 02/12/2014   TRIG 180.0* 02/12/2014   HDL 68.20 02/12/2014   LDLCALC 94 02/12/2014   ALT 27 01/30/2014   AST 24 01/30/2014   NA 135 05/30/2014   K 3.9 05/30/2014   CL 99 05/30/2014   CREATININE 0.83 05/30/2014   BUN 8 05/30/2014   CO2 28 05/30/2014   TSH 0.80 05/30/2014   INR 1.0 02/27/2008   HGBA1C 8.3* 05/30/2014   MICROALBUR 0.7 10/27/2013       Assessment & Plan:

## 2014-07-02 NOTE — Patient Instructions (Signed)

## 2014-07-02 NOTE — Patient Instructions (Signed)
If you will let us see the denial letters from your insurance, to help figure out why there has been a problem with billing.  We can continue present meds.  Sample Advair HFA [id number]   2 puffs then rinse mouth, twice daily

## 2014-07-02 NOTE — Assessment & Plan Note (Signed)
I think she would benefit from starting an SGLT-2 inhibitor to help lower her blood sugar Will also change her insulin from NPH to a basal with toujeo and bolus with humalog

## 2014-07-02 NOTE — Assessment & Plan Note (Signed)
Her BP is well controlled Lytes and renal function are stable 
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
## 2014-07-02 NOTE — Assessment & Plan Note (Signed)
Her recent TSh was in the normal range Will cont the current dose of synthroid

## 2014-07-03 ENCOUNTER — Telehealth: Payer: Self-pay | Admitting: Internal Medicine

## 2014-07-03 NOTE — Telephone Encounter (Signed)
Spoke with the pt  She states that she was seen by CDY 07/02/14 and forgot what new BP med she had been started on  She is calling to report that she is taking triam/hct 37.5/25 mg once dail  I have added to her med list  Nothing further needed  Will forward to CDY to make him aware

## 2014-07-18 ENCOUNTER — Telehealth: Payer: Self-pay | Admitting: Cardiovascular Disease

## 2014-07-18 NOTE — Telephone Encounter (Signed)
Returning someones call 

## 2014-07-18 NOTE — Telephone Encounter (Signed)
This pt has not been seen since 03/2013 by Dr. Bronson Ing and that was in [place] office. Not sure why this was routed. Spoke with pt and pulled upcoming appts, pt was aware of upcoming appt with other physicians. Stated she wanted to wait and schedule f/u appt with DR. Koneswaran as soon as car was fixed and she had transportation. Says she will call back.

## 2014-07-20 ENCOUNTER — Ambulatory Visit: Payer: Self-pay | Admitting: Internal Medicine

## 2014-07-23 ENCOUNTER — Other Ambulatory Visit: Payer: Self-pay | Admitting: *Deleted

## 2014-07-23 NOTE — Telephone Encounter (Signed)
Received fax pt reauesting refill on her Relion Novolin 70/30. Med was d/c 07/02/14 pt should be taking Toujeo 50 units daily & Humalog 5 units TID. Fax request back to [facility] with response...[name]

## 2014-07-26 ENCOUNTER — Ambulatory Visit: Payer: Self-pay | Admitting: Nutrition

## 2014-07-30 ENCOUNTER — Other Ambulatory Visit: Payer: Self-pay | Admitting: Internal Medicine

## 2014-08-01 ENCOUNTER — Telehealth: Payer: Self-pay | Admitting: Internal Medicine

## 2014-08-01 NOTE — Telephone Encounter (Signed)
lmtcb for pt.  

## 2014-08-02 NOTE — Telephone Encounter (Signed)
Noted  

## 2014-08-02 NOTE — Telephone Encounter (Signed)
[phone], pt cb

## 2014-08-02 NOTE — Telephone Encounter (Signed)
FYI for Dr Annamaria Boots - Pt states she has not forgotten about the paper she was going to bring by regarding her insurance not paying [place] due to problems with dx codes.  She states her car is in the shop and will bring it by as soon as she can.

## 2014-08-05 DIAGNOSIS — T1490 Injury, unspecified: Secondary | ICD-10-CM | POA: Diagnosis not present

## 2014-08-07 ENCOUNTER — Encounter: Payer: Self-pay | Admitting: Internal Medicine

## 2014-08-07 NOTE — Assessment & Plan Note (Signed)
Recent exacerbation brought back under control after hospital stay. We discussed need to minimize systemic steroids being given as treatment for her asthmatic bronchitis

## 2014-08-07 NOTE — Assessment & Plan Note (Signed)
Her pattern shifts between intermittent uncomplicated and moderate persistent, affected heavily by virus infections and her ability to afford medication. Plan-sample Advair HFA for trial with discussion

## 2014-08-07 NOTE — Assessment & Plan Note (Signed)
Not noticing a significant seasonal exacerbation at this time in the spring pollen season. We discussed use of antihistamines and nasal steroid sprays were appropriate.

## 2014-08-07 NOTE — Assessment & Plan Note (Signed)
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 Swallowing counseling done again.

## 2014-08-07 NOTE — Assessment & Plan Note (Addendum)
She is having sustained mild exacerbation of an asthmatic bronchitis with trace yellow sputum. Recent hospital stay was primarily about hyperglycemia. Not clear that infection was the trigger. We need to manage her breathing with minimal systemic steroids if possible. Plan-nebulizer treatment today with Xopenex, emphasize pulmonary toilet by adding flutter device and basic pulmonary hygiene as discussed. Continue current bronchodilators.

## 2014-08-19 ENCOUNTER — Emergency Department (HOSPITAL_COMMUNITY)
Admission: EM | Admit: 2014-08-19 | Discharge: 2014-08-20 | Disposition: A | Discharge status: Home / Self Care | Payer: Medicare Other | Attending: Emergency Medicine | Admitting: Emergency Medicine

## 2014-08-19 ENCOUNTER — Encounter (HOSPITAL_COMMUNITY): Payer: Self-pay | Admitting: *Deleted

## 2014-08-19 DIAGNOSIS — D039 Melanoma in situ, unspecified: Secondary | ICD-10-CM | POA: Diagnosis not present

## 2014-08-19 DIAGNOSIS — E1165 Type 2 diabetes mellitus with hyperglycemia: Secondary | ICD-10-CM | POA: Diagnosis not present

## 2014-08-19 DIAGNOSIS — F329 Major depressive disorder, single episode, unspecified: Secondary | ICD-10-CM | POA: Insufficient documentation

## 2014-08-19 DIAGNOSIS — Z7982 Long term (current) use of aspirin: Secondary | ICD-10-CM | POA: Insufficient documentation

## 2014-08-19 DIAGNOSIS — G8929 Other chronic pain: Secondary | ICD-10-CM | POA: Diagnosis not present

## 2014-08-19 DIAGNOSIS — K219 Gastro-esophageal reflux disease without esophagitis: Secondary | ICD-10-CM | POA: Insufficient documentation

## 2014-08-19 DIAGNOSIS — Z79899 Other long term (current) drug therapy: Secondary | ICD-10-CM | POA: Diagnosis not present

## 2014-08-19 DIAGNOSIS — R109 Unspecified abdominal pain: Secondary | ICD-10-CM

## 2014-08-19 DIAGNOSIS — I10 Essential (primary) hypertension: Secondary | ICD-10-CM | POA: Insufficient documentation

## 2014-08-19 DIAGNOSIS — R1013 Epigastric pain: Secondary | ICD-10-CM | POA: Diagnosis not present

## 2014-08-19 DIAGNOSIS — R000 Tachycardia, unspecified: Secondary | ICD-10-CM | POA: Insufficient documentation

## 2014-08-19 DIAGNOSIS — F419 Anxiety disorder, unspecified: Secondary | ICD-10-CM | POA: Diagnosis not present

## 2014-08-19 DIAGNOSIS — Z794 Long term (current) use of insulin: Secondary | ICD-10-CM | POA: Insufficient documentation

## 2014-08-19 DIAGNOSIS — Z91040 Latex allergy status: Secondary | ICD-10-CM | POA: Insufficient documentation

## 2014-08-19 DIAGNOSIS — G40909 Epilepsy, unspecified, not intractable, without status epilepticus: Secondary | ICD-10-CM | POA: Diagnosis not present

## 2014-08-19 DIAGNOSIS — K5900 Constipation, unspecified: Secondary | ICD-10-CM | POA: Insufficient documentation

## 2014-08-19 DIAGNOSIS — Z87828 Personal history of other (healed) physical injury and trauma: Secondary | ICD-10-CM | POA: Insufficient documentation

## 2014-08-19 DIAGNOSIS — J45909 Unspecified asthma, uncomplicated: Secondary | ICD-10-CM | POA: Insufficient documentation

## 2014-08-19 DIAGNOSIS — Z87891 Personal history of nicotine dependence: Secondary | ICD-10-CM | POA: Insufficient documentation

## 2014-08-19 DIAGNOSIS — R739 Hyperglycemia, unspecified: Secondary | ICD-10-CM

## 2014-08-19 DIAGNOSIS — R1033 Periumbilical pain: Secondary | ICD-10-CM | POA: Diagnosis present

## 2014-08-19 NOTE — ED Notes (Signed)
Pt brought in by [facility] for c/o abdominal pain; pt states she has taken meds for the pain with no relief, pt denies vomiting states she has had nausea

## 2014-08-20 ENCOUNTER — Emergency Department (HOSPITAL_COMMUNITY): Payer: Medicare Other

## 2014-08-20 LAB — CBC WITH DIFFERENTIAL/PLATELET
Basophils Absolute: 0 10*3/uL (ref 0.0–0.1)
Basophils Relative: 0 % (ref 0–1)
Eosinophils Absolute: 0.3 10*3/uL (ref 0.0–0.7)
Eosinophils Relative: 3 % (ref 0–5)
HCT: 41.5 % (ref 36.0–46.0)
Hemoglobin: 14.4 g/dL (ref 12.0–15.0)
Lymphocytes Relative: 33 % (ref 12–46)
Lymphs Abs: 3 10*3/uL (ref 0.7–4.0)
MCH: 31.2 pg (ref 26.0–34.0)
MCHC: 34.7 g/dL (ref 30.0–36.0)
MCV: 90 fL (ref 78.0–100.0)
Monocytes Absolute: 0.6 10*3/uL (ref 0.1–1.0)
Monocytes Relative: 7 % (ref 3–12)
Neutro Abs: 5.2 10*3/uL (ref 1.7–7.7)
Neutrophils Relative %: 57 % (ref 43–77)
Platelets: 230 10*3/uL (ref 150–400)
RBC: 4.61 MIL/uL (ref 3.87–5.11)
RDW: 13.7 % (ref 11.5–15.5)
WBC: 9.1 10*3/uL (ref 4.0–10.5)

## 2014-08-20 LAB — URINALYSIS, ROUTINE W REFLEX MICROSCOPIC
Bilirubin Urine: NEGATIVE
Glucose, UA: >1000 mg/dL — AB
Hgb urine dipstick: NEGATIVE
Ketones, ur: NEGATIVE mg/dL
Leukocytes, UA: NEGATIVE
Nitrite: NEGATIVE
Protein, ur: NEGATIVE mg/dL
Specific Gravity, Urine: <1.005 — ABNORMAL LOW (ref 1.005–1.030)
Urobilinogen, UA: 0.2 mg/dL (ref 0.0–1.0)
pH: 6 (ref 5.0–8.0)

## 2014-08-20 LAB — URINE MICROSCOPIC-ADD ON

## 2014-08-20 LAB — HEPATIC FUNCTION PANEL
ALT: 33 U/L (ref 14–54)
AST: 21 U/L (ref 15–41)
Albumin: 4.2 g/dL (ref 3.5–5.0)
Alkaline Phosphatase: 80 U/L (ref 38–126)
Bilirubin, Direct: 0.1 mg/dL (ref 0.1–0.5)
Indirect Bilirubin: 0.4 mg/dL (ref 0.3–0.9)
Total Bilirubin: 0.5 mg/dL (ref 0.3–1.2)
Total Protein: 7.2 g/dL (ref 6.5–8.1)

## 2014-08-20 LAB — BASIC METABOLIC PANEL
Anion gap: 10 (ref 5–15)
BUN: 17 mg/dL (ref 6–20)
CO2: 29 mmol/L (ref 22–32)
Calcium: 9.3 mg/dL (ref 8.9–10.3)
Chloride: 92 mmol/L — ABNORMAL LOW (ref 101–111)
Creatinine, Ser: 0.77 mg/dL (ref 0.44–1.00)
GFR calc Af Amer: >60 mL/min (ref >60)
GFR calc non Af Amer: >60 mL/min (ref >60)
Glucose, Bld: 309 mg/dL — ABNORMAL HIGH (ref 65–99)
Potassium: 4.2 mmol/L (ref 3.5–5.1)
Sodium: 131 mmol/L — ABNORMAL LOW (ref 135–145)

## 2014-08-20 LAB — CBG MONITORING, ED: Glucose-Capillary: 186 mg/dL — ABNORMAL HIGH (ref 65–99)

## 2014-08-20 LAB — TROPONIN I: Troponin I: <0.03 ng/mL (ref <0.031)

## 2014-08-20 MED ORDER — GI COCKTAIL ~~LOC~~
ORAL | Status: AC
  Filled 2014-08-20: qty 30

## 2014-08-20 MED ORDER — METOPROLOL SUCCINATE ER 50 MG PO TB24
50.0000 mg | ORAL_TABLET | Freq: Every day | ORAL | Status: DC
  Administered 2014-08-20: 50 mg via ORAL
  Filled 2014-08-20 (×2): qty 1

## 2014-08-20 MED ORDER — METOPROLOL SUCCINATE ER 50 MG PO TB24
ORAL_TABLET | ORAL | Status: AC
  Filled 2014-08-20: qty 1

## 2014-08-20 MED ORDER — GI COCKTAIL ~~LOC~~
30.0000 mL | Freq: Once | ORAL | Status: AC
  Administered 2014-08-20: 30 mL via ORAL

## 2014-08-20 MED ORDER — SODIUM CHLORIDE 0.9 % IV BOLUS (SEPSIS)
1000.0000 mL | Freq: Once | INTRAVENOUS | Status: AC
  Administered 2014-08-20: 1000 mL via INTRAVENOUS

## 2014-08-20 MED ORDER — PANTOPRAZOLE SODIUM 40 MG PO TBEC: 40.0000 mg | DELAYED_RELEASE_TABLET | Freq: Every day | ORAL | Status: DC

## 2014-08-20 MED ORDER — HYDROCODONE-ACETAMINOPHEN 5-325 MG PO TABS
2.0000 | ORAL_TABLET | Freq: Once | ORAL | Status: AC
  Administered 2014-08-20: 2 via ORAL
  Filled 2014-08-20: qty 2

## 2014-08-20 NOTE — ED Notes (Signed)
Discharge instructions given, pt demonstrated teach back and verbal understanding. No concerns voiced.  

## 2014-08-20 NOTE — ED Provider Notes (Signed)
CSN: [id number]     Arrival date & time 08/19/14  2310 History   First MD Initiated Contact with Patient 08/19/14 2324     Chief Complaint  Patient presents with  . Abdominal Pain     (Consider location/radiation/quality/duration/timing/severity/associated sxs/prior Treatment) The history is provided by the patient.   [name] is a 64 y.o. female with multiple medication problems, most pertinent for esophageal reflux, htn, DM, dysphagia and constipation presenting with a 4 day history of sharp midsternal lower chest pain which has been intermittent and worsened with meals.  Tonight she had worsening of pain which radiated into the periumbilical and LUQ and is associated with nausea but no emesis.  She does endorse being constipated, but had a bm tonight after drinking 2 large caffeinated iced teas with some but not complete improvement in her pain.  She has also taken otc antacids over the past several days with no relief of symptoms.  She denies vomiting, diaphoresis, palpitation.  She ate pinto beans for lunch today and has had increased gas, also endorses eating a caramel sundae at noon which she blames on her now elevated blood glucose.  She denies polydipsia and polyuria, denies dysuria.      Past Medical History  Diagnosis Date  . 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 [name], residual palpitations  . Vocal cord dysfunction   . Tremor, essential     takes Mysoline and Neurontin daily.  . Allergic rhinitis     uses Nasonex daily as needed  . Low sodium syndrome   . Chronic back pain     reason unknown  . Lumbar radiculopathy   . DDD (degenerative disc disease), lumbar   . Esophageal reflux     takes Zantac daily  . Hypertension     takes Losartan daily  . Anxiety     takes Ativan daily as needed  . Hypothyroidism     takes Synthroid daily  . Depression     takes Zoloft daily as well as Cymbalta  . Diabetes mellitus      takes NOvolin daily  . Asthma     Albuterol inhaler and Neb daily as needed  . History of bronchitis Dec 2014  . Seizures 04-05-13    one time ran out of Primidone and had stopped it cold [place]  . Weakness     in left arm  . Joint pain   . Joint swelling     left ankle  . Dysphagia   . Constipation   . Allergy to angiotensin receptor blockers (ARB) 01/22/2014    Angioedema  . 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 Rayann Heman 2007   Past Surgical History  Procedure Laterality Date  . Tonsillectomy    . Right breast cyst      benign  . Left ankle ligament repair      x 2  . Left elbow repair    . Partial hysterectomy    . Cholecystectomy    . Cesarean section    . Ablation for avnrt    . Colonoscopy  01/16/2004    ZCH:YIFOYD rectum/colon  . Esophagogastroduodenoscopy (egd) with esophageal dilation  04/03/2002    XAJ:OINOMVEH'M ring, otherwise normal esophagus, status post dilation with 54 F/Normal stomach  . Bartholin gland cyst excision      x 2  . Esophagogastroduodenoscopy (egd) with esophageal dilation N/A 11/08/2012    Procedure: ESOPHAGOGASTRODUODENOSCOPY (EGD)  WITH ESOPHAGEAL DILATION;  Surgeon: Daneil Dolin, MD;  Location: [facility];  Service: Endoscopy;  Laterality: N/A;  11:30  . Subthalamic stimulator insertion Bilateral 12/04/2013    Procedure: Bilateral Deep brain stimulator placement;  Surgeon: [name], MD;  Location: [place] [facility];  Service: Neurosurgery;  Laterality: Bilateral;  Bilateral Deep brain stimulator placement  . Pulse generator implant Bilateral 12/15/2013    Procedure: BILATERAL PULSE GENERATOR IMPLANT;  Surgeon: [name], MD;  Location: [place] [facility];  Service: Neurosurgery;  Laterality: Bilateral;  BILATERAL  . Maximum access (mas)posterior lumbar interbody fusion (plif) 1 level N/A 04/24/2014    Procedure: Lumbar four-five Maximum access Surgery posterior lumbar interbody fusion;  Surgeon: [name], MD;  Location: [place] [facility];   Service: Neurosurgery;  Laterality: N/A;  Lumbar four-five Maximum access Surgery posterior lumbar interbody fusion   Family History  Problem Relation Age of Onset  . Lung cancer Father     DIED AGE 38 LUNG CA  . Alcohol abuse Father   . Anxiety disorder Father   . Depression Father   . COPD Mother     DIED AGE 43,MRSA,COPD,PNEUMONIA  . Pneumonia Mother     DIED AGE 43,MRSA,COPD,PNEUMONIA  . Supraventricular tachycardia Mother   . Anxiety disorder Mother   . Depression Mother   . Alcohol abuse Mother   . COPD Brother     AGE 16  . Heart disease Sister     DIED AGE 52, SMOKER,?HEART  . Colon cancer Neg Hx    History  Substance Use Topics  . Smoking status: Former Smoker -- 0.30 packs/day for 10 years    Types: Cigarettes    Quit date: 04/23/1993  . Smokeless tobacco: Never Used     Comment: quit smoking 25+yrs ago  . Alcohol Use: No     Comment: no alcohol in 62yrs   OB History    No data available     Review of Systems  Constitutional: Negative for fever and chills.  HENT: Negative for congestion and sore throat.   Eyes: Negative.   Respiratory: Negative for chest tightness and shortness of breath.   Cardiovascular: Negative for chest pain.  Gastrointestinal: Positive for nausea, abdominal pain and constipation. Negative for vomiting.  Endocrine: Negative for polydipsia and polyphagia.  Genitourinary: Negative.  Negative for dysuria, frequency and hematuria.  Musculoskeletal: Negative for joint swelling, arthralgias and neck pain.  Skin: Negative.  Negative for rash and wound.  Neurological: Negative for dizziness, weakness, light-headedness, numbness and headaches.  Psychiatric/Behavioral: Negative.       Allergies  Losartan; Metformin and related; Latex; Morphine and related; Other; and Oxycodone-acetaminophen  Home Medications   Prior to Admission medications   Medication Sig Start Date End Date Taking? Authorizing Provider  albuterol (PROAIR HFA) 108  (90 BASE) MCG/ACT inhaler Inhale 2 puffs into the lungs every 6 (six) hours as needed for wheezing or shortness of breath. 08/03/13   Tammy S Parrett, NP  albuterol (PROVENTIL) (2.5 MG/3ML) 0.083% nebulizer solution Take 3 mLs (2.5 mg total) by nebulization every 6 (six) hours as needed for wheezing or shortness of breath. 02/01/14   Deneise Lever, MD  amLODipine (NORVASC) 10 MG tablet Take 10 mg by mouth daily.    Historical Provider, MD  aspirin EC 81 MG tablet Take 81 mg by mouth every morning.     Historical Provider, MD  canagliflozin (INVOKANA) 300 MG TABS tablet Take 300 mg by mouth daily. 07/02/14   Marcello Moores  Evalina Field, MD  DULoxetine (CYMBALTA) 60 MG capsule Take 1 capsule (60 mg total) by mouth daily. 05/30/14 05/30/15  Janith Lima, MD  fluticasone-salmeterol (ADVAIR HFA) [phone] MCG/ACT inhaler Inhale 2 puffs into the lungs 2 (two) times daily. 07/02/14   Deneise Lever, MD  gabapentin (NEURONTIN) 100 MG capsule Take 300 mg by mouth 2 (two) times daily.     Historical Provider, MD  glucose blood test strip Use TID  E11.9 04/10/14   Biagio Borg, MD  HYDROcodone-acetaminophen [facility]) 10-325 MG per tablet Take 1 tablet by mouth every 6 (six) hours as needed.    Historical Provider, MD  Insulin Glargine (TOUJEO SOLOSTAR) 300 UNIT/ML SOPN Inject 50 Units into the skin daily. 07/02/14   Janith Lima, MD  Insulin Lispro, Human, (HUMALOG KWIKPEN) 200 UNIT/ML SOPN Inject 5 Units into the skin 3 (three) times daily with meals. 07/02/14   Janith Lima, MD  ketoconazole (NIZORAL) 2 % cream Apply 1 application topically daily. 05/30/14   Janith Lima, MD  levothyroxine (SYNTHROID, LEVOTHROID) 75 MCG tablet Take 1 tablet (75 mcg total) by mouth daily before breakfast. 02/12/14   Janith Lima, MD  metoprolol succinate (TOPROL-XL) 50 MG 24 hr tablet TAKE ONE TABLET BY MOUTH TWICE DAILY, TAKE WITH A MEAL OR IMMEDIATELY FOLLOWING A MEAL. 05/20/14   Janith Lima, MD  Multiple Vitamins-Minerals (MULTIVITAMIN  PO) Take 1 tablet by mouth daily.    Historical Provider, MD  Omega-3 Fatty Acids (FISH OIL PO) Take 1 capsule by mouth daily.    Historical Provider, MD  pantoprazole (PROTONIX) 40 MG tablet Take 1 tablet (40 mg total) by mouth daily. 08/20/14   Evalee Jefferson, PA-C  primidone (MYSOLINE) 50 MG tablet TAKE THREE TABLETS BY MOUTH TWICE DAILY 07/30/14   Janith Lima, MD  ranitidine (ZANTAC) 150 MG tablet Take 150 mg by mouth 2 (two) times daily.     Historical Provider, MD  [place] 1CC/30G 30G X 5/16" 1 ML MISC  05/03/14   Historical Provider, MD  sertraline (ZOLOFT) 100 MG tablet Take one and one half tablets daily Patient taking differently: Take 100 mg by mouth at bedtime.  02/07/14   Cloria Spring, MD  tiZANidine (ZANAFLEX) 4 MG tablet Take 4 mg by mouth 3 (three) times daily.    Historical Provider, MD  triamterene-hydrochlorothiazide (MAXZIDE-25) 37.5-25 MG per tablet Take 1 tablet by mouth daily.    Historical Provider, MD   BP 131/96 mmHg  Pulse 109  Temp(Src) 99.2 F (37.3 C) (Oral)  Resp 15  Ht 5' 4.5" (1.638 m)  Wt 210 lb (95.255 kg)  BMI 35.50 kg/m2  SpO2 96% Physical Exam  Constitutional: She appears well-developed and well-nourished.  HENT:  Head: Normocephalic and atraumatic.  Mouth/Throat: No oropharyngeal exudate.  Eyes: Conjunctivae are normal.  Neck: Normal range of motion.  Cardiovascular: Normal rate, regular rhythm, normal heart sounds and intact distal pulses.   Borderline tachycardic.  Pulmonary/Chest: Effort normal and breath sounds normal. She has no wheezes.  Abdominal: Soft. Bowel sounds are normal. She exhibits no distension and no mass. There is tenderness in the epigastric area, periumbilical area and left upper quadrant. There is no rebound and no guarding.  Mild tenderness without guard or rebound.  Musculoskeletal: Normal range of motion.  Neurological: She is alert.  Skin: Skin is warm and dry.  Psychiatric: She has a normal mood and affect.   Nursing note and vitals reviewed.  ED Course  Procedures (including critical care time) Labs Review Labs Reviewed  BASIC METABOLIC PANEL - Abnormal; Notable for the following:    Sodium 131 (*)    Chloride 92 (*)    Glucose, Bld 309 (*)    All other components within normal limits  URINALYSIS, ROUTINE W REFLEX MICROSCOPIC - Abnormal; Notable for the following:    Specific Gravity, Urine <1.005 (*)    Glucose, UA >1000 (*)    All other components within normal limits  URINE MICROSCOPIC-ADD ON - Abnormal; Notable for the following:    Bacteria, UA FEW (*)    All other components within normal limits  CBC WITH DIFFERENTIAL/PLATELET  TROPONIN I  HEPATIC FUNCTION PANEL  CBG MONITORING, ED    Imaging Review Dg Abd Acute W/chest  08/20/2014   CLINICAL DATA:  Generalized abdominal pain for 1 night. Mid chest pain, nausea and cough.  EXAM: DG ABDOMEN ACUTE W/ 1V CHEST  COMPARISON:  Chest radiograph 02/19/2014  FINDINGS: Unchanged elevation of right hemidiaphragm. The cardiomediastinal contours are normal. The lungs are clear. Battery packs overlie the right and left chest wall with leads extending in the neck. There is no free intra-abdominal air. No dilated bowel loops to suggest obstruction. Moderate volume of stool throughout the colon. No radiopaque calculi. Surgical clips in the right upper quadrant of the abdomen likely from cholecystectomy. No acute osseous abnormalities are seen. Postsurgical change noted in the lower lumbar spine.  IMPRESSION: Normal bowel gas pattern with moderate volume of colonic stool.   Electronically Signed   By: Jeb Levering M.D.   On: 08/20/2014 02:32     EKG Interpretation   Date/Time:  Sunday Aug 19 2014 23:24:46 EDT Ventricular Rate:  103 PR Interval:  154 QRS Duration: 94 QT Interval:  352 QTC Calculation: 461 R Axis:   72 Text Interpretation:  Sinus tachycardia Nonspecific ST and T wave  abnormality No significant change since last tracing  19 Feb 2014 Confirmed  by [facility]  MD-I, IVA ([id number]) on 08/20/2014 1:05:27 AM      MDM   Final diagnoses:  Abdominal pain  Gastroesophageal reflux disease, esophagitis presence not specified  Constipation, unspecified constipation type  Hyperglycemia    Pt pain improved with hydrocodone and GI cocktail given with improved sx.  She is to also receive a NS bolus 1 liter to reduce blood glucose.  Plan dc once cbg less than 250.  Given her evening dose of metoprolol which is hours overdue, suspect as the source of her borderline tachycardia. She also endorses white coat syndrome and states her pulse is usually elevated during medical visits.     Patients labs and/or radiological studies were reviewed and considered during the medical decision making and disposition process.  Results were also discussed with patient.  Corrected Na+ 134.  Pt re-examined after xrays obtained, abdomen soft, nontender, no guarding.  She was prescribed protonix which she states she has been on in the past and helpful for her gerd.  She reports taking dulcolax daily for constipation.  She also has miralax at home but does not use.  Advised using miralax instead of dulcolax which can cause dependency.  Atypical 4 day h/o CP with negative troponin, ekg stable, doubt cardiac source.  Advised f/u with her pcp if sx persist or worsen.      Evalee Jefferson, PA-C 08/20/14 0239  Evalee Jefferson, PA-C 08/20/14 8341  Rolland Porter, MD 08/20/14 [phone]

## 2014-08-20 NOTE — Discharge Instructions (Signed)
Constipation Constipation is when a person:  Poops (has a bowel movement) less than 3 times a week.  Has a hard time pooping.  Has poop that is dry, hard, or bigger than normal. HOME CARE   Eat foods with a lot of fiber in them. This includes fruits, vegetables, beans, and whole grains such as brown rice.  Avoid fatty foods and foods with a lot of sugar. This includes french fries, hamburgers, cookies, candy, and soda.  If you are not getting enough fiber from food, take products with added fiber in them (supplements).  Drink enough fluid to keep your pee (urine) clear or pale yellow.  Exercise on a regular basis, or as told by your doctor.  Go to the restroom when you feel like you need to poop. Do not hold it.  Only take medicine as told by your doctor. Do not take medicines that help you poop (laxatives) without talking to your doctor first. GET HELP RIGHT AWAY IF:   You have bright red blood in your poop (stool).  Your constipation lasts more than 4 days or gets worse.  You have belly (abdominal) or butt (rectal) pain.  You have thin poop (as thin as a pencil).  You lose weight, and it cannot be explained. MAKE SURE YOU:   Understand these instructions.  Will watch your condition.  Will get help right away if you are not doing well or get worse. Document Released: 09/09/2007 Document Revised: 03/28/2013 Document Reviewed: 01/02/2013 [facility] Patient Information 2015 [place], [place]. This information is not intended to replace advice given to you by your health care provider. Make sure you discuss any questions you have with your health care provider.  Gastroesophageal Reflux Disease, Adult Gastroesophageal reflux disease (GERD) happens when acid from your stomach goes into your food pipe (esophagus). The acid can cause a burning feeling in your chest. Over time, the acid can make small holes (ulcers) in your food pipe.  HOME CARE  Ask your doctor for advice  about:  Losing weight.  Quitting smoking.  Alcohol use.  Avoid foods and drinks that make your problems worse. You may want to avoid:  Caffeine and alcohol.  Chocolate.  Mints.  Garlic and onions.  Spicy foods.  Citrus fruits, such as oranges, lemons, or limes.  Foods that contain tomato, such as sauce, chili, salsa, and pizza.  Fried and fatty foods.  Avoid lying down for 3 hours before you go to bed or before you take a nap.  Eat small meals often, instead of large meals.  Wear loose-fitting clothing. Do not wear anything tight around your waist.  Raise (elevate) the head of your bed 6 to 8 inches with wood blocks. Using extra pillows does not help.  Only take medicines as told by your doctor.  Do not take aspirin or ibuprofen. GET HELP RIGHT AWAY IF:   You have pain in your arms, neck, jaw, teeth, or back.  Your pain gets worse or changes.  You feel sick to your stomach (nauseous), throw up (vomit), or sweat (diaphoresis).  You feel short of breath, or you pass out (faint).  Your throw up is green, yellow, black, or looks like coffee grounds or blood.  Your poop (stool) is red, bloody, or black. MAKE SURE YOU:   Understand these instructions.  Will watch your condition.  Will get help right away if you are not doing well or get worse. Document Released: 09/09/2007 Document Revised: 06/15/2011 Document Reviewed: 10/10/2010 [employer] Patient  Information 2015 [place], [place]. This information is not intended to replace advice given to you by your health care provider. Make sure you discuss any questions you have with your health care provider.  High Blood Sugar High blood sugar (hyperglycemia) means that the level of sugar in your blood is higher than it should be. Signs of high blood sugar include:  Feeling thirsty.  Frequent peeing (urinating).  Feeling tired or sleepy.  Dry mouth.  Vision changes.  Feeling weak.  Feeling hungry but losing  weight.  Numbness and tingling in your hands or feet.  Headache. When you ignore these signs, your blood sugar may keep going up. These problems may get worse, and other problems may begin. HOME CARE  Check your blood sugars as told by your doctor. Write down the numbers with the date and time.  Take the right amount of insulin or diabetes pills at the right time. Write down the dose with date and time.  Refill your insulin or diabetes pills before running out.  Watch what you eat. Follow your meal plan.  Drink liquids without sugar, such as water. Check with your doctor if you have kidney or heart disease.  Follow your doctor's orders for exercise. Exercise at the same time of day.  Keep your doctor's appointments. GET HELP RIGHT AWAY IF:   You have trouble thinking or are confused.  You have fast breathing with fruity smelling breath.  You pass out (faint).  You have 2 to 3 days of high blood sugars and you do not know why.  You have chest pain.  You are feeling sick to your stomach (nauseous) or throwing up (vomiting).  You have sudden vision changes. MAKE SURE YOU:   Understand these instructions.  Will watch your condition.  Will get help right away if you are not doing well or get worse. Document Released: 01/18/2009 Document Revised: 06/15/2011 Document Reviewed: 01/18/2009 [facility] Patient Information 2015 [place], [place]. This information is not intended to replace advice given to you by your health care provider. Make sure you discuss any questions you have with your health care provider.   Use the dulcolax sparingly, once or twice weekly is ok, but not a good choice to take daily for your constipation. Miralax daily would be a much better choice.  Take the protonix as prescribed.  See your doctor for a recheck for any persistent symptoms.  Your labs and xrays, ekg are all ok today with the exception of your blood glucose being elevated. Watch this  closely.

## 2014-08-21 ENCOUNTER — Ambulatory Visit (INDEPENDENT_AMBULATORY_CARE_PROVIDER_SITE_OTHER): Payer: Medicare Other | Admitting: Internal Medicine

## 2014-08-21 ENCOUNTER — Encounter: Payer: Self-pay | Admitting: Internal Medicine

## 2014-08-21 VITALS — BP 122/84 | HR 80 | Temp 98.8°F | Resp 16 | Ht 64.5 in | Wt 191.0 lb

## 2014-08-21 DIAGNOSIS — M4806 Spinal stenosis, lumbar region: Secondary | ICD-10-CM | POA: Diagnosis not present

## 2014-08-21 DIAGNOSIS — M48061 Spinal stenosis, lumbar region without neurogenic claudication: Secondary | ICD-10-CM

## 2014-08-21 DIAGNOSIS — Z1231 Encounter for screening mammogram for malignant neoplasm of breast: Secondary | ICD-10-CM | POA: Insufficient documentation

## 2014-08-21 DIAGNOSIS — K2100 Gastro-esophageal reflux disease with esophagitis, without bleeding: Secondary | ICD-10-CM

## 2014-08-21 MED ORDER — OMEPRAZOLE 40 MG PO CPDR: 40.0000 mg | DELAYED_RELEASE_CAPSULE | Freq: Every day | ORAL | Status: DC

## 2014-08-21 MED ORDER — HYDROCODONE-ACETAMINOPHEN 10-325 MG PO TABS: 1.0000 | ORAL_TABLET | Freq: Four times a day (QID) | ORAL | Status: DC | PRN

## 2014-08-21 NOTE — Progress Notes (Signed)
Pre visit review using our clinic review tool, if applicable. No additional management support is needed unless otherwise documented below in the visit note. 

## 2014-08-21 NOTE — Patient Instructions (Signed)
Back Pain, Adult Low back pain is very common. About 1 in 5 people have back pain.The cause of low back pain is rarely dangerous. The pain often gets better over time.About half of people with a sudden onset of back pain feel better in just 2 weeks. About 8 in 10 people feel better by 6 weeks.  CAUSES Some common causes of back pain include:  Strain of the muscles or ligaments supporting the spine.  Wear and tear (degeneration) of the spinal discs.  Arthritis.  Direct injury to the back. DIAGNOSIS Most of the time, the direct cause of low back pain is not known.However, back pain can be treated effectively even when the exact cause of the pain is unknown.Answering your caregiver's questions about your overall health and symptoms is one of the most accurate ways to make sure the cause of your pain is not dangerous. If your caregiver needs more information, he or she may order lab work or imaging tests (X-rays or MRIs).However, even if imaging tests show changes in your back, this usually does not require surgery. HOME CARE INSTRUCTIONS For many people, back pain returns.Since low back pain is rarely dangerous, it is often a condition that people can learn to manageon their own.   Remain active. It is stressful on the back to sit or stand in one place. Do not sit, drive, or stand in one place for more than 30 minutes at a time. Take short walks on level surfaces as soon as pain allows.Try to increase the length of time you walk each day.  Do not stay in bed.Resting more than 1 or 2 days can delay your recovery.  Do not avoid exercise or work.Your body is made to move.It is not dangerous to be active, even though your back may hurt.Your back will likely heal faster if you return to being active before your pain is gone.  Pay attention to your body when you bend and lift. Many people have less discomfortwhen lifting if they bend their knees, keep the load close to their bodies,and  avoid twisting. Often, the most comfortable positions are those that put less stress on your recovering back.  Find a comfortable position to sleep. Use a firm mattress and lie on your side with your knees slightly bent. If you lie on your back, put a pillow under your knees.  Only take over-the-counter or prescription medicines as directed by your caregiver. Over-the-counter medicines to reduce pain and inflammation are often the most helpful.Your caregiver may prescribe muscle relaxant drugs.These medicines help dull your pain so you can more quickly return to your normal activities and healthy exercise.  Put ice on the injured area.  Put ice in a plastic bag.  Place a towel between your skin and the bag.  Leave the ice on for 15-20 minutes, 03-04 times a day for the first 2 to 3 days. After that, ice and heat may be alternated to reduce pain and spasms.  Ask your caregiver about trying back exercises and gentle massage. This may be of some benefit.  Avoid feeling anxious or stressed.Stress increases muscle tension and can worsen back pain.It is important to recognize when you are anxious or stressed and learn ways to manage it.Exercise is a great option. SEEK MEDICAL CARE IF:  You have pain that is not relieved with rest or medicine.  You have pain that does not improve in 1 week.  You have new symptoms.  You are generally not feeling well. SEEK   IMMEDIATE MEDICAL CARE IF:   You have pain that radiates from your back into your legs.  You develop new bowel or bladder control problems.  You have unusual weakness or numbness in your arms or legs.  You develop nausea or vomiting.  You develop abdominal pain.  You feel faint. Document Released: 03/23/2005 Document Revised: 09/22/2011 Document Reviewed: 07/25/2013 ExitCare Patient Information 2015 [employer]. This information is not intended to replace advice given to you by your health care provider. Make sure you  discuss any questions you have with your health care provider.  

## 2014-08-22 NOTE — Progress Notes (Signed)
Subjective:  Patient ID: [name], female    DOB: 24-Aug-1950  Age: 64 y.o. MRN: [id number]  CC: Back Pain; Gastrophageal Reflux; Diabetes; and Hypothyroidism   HPI [name] presents for follow up on LBP - well controlled with norco, and [name] complains that the [name] needs a generic form of a PPI.  Outpatient Prescriptions Prior to Visit  Medication Sig Dispense Refill  . albuterol (PROAIR HFA) 108 (90 BASE) MCG/ACT inhaler Inhale 2 puffs into the lungs every 6 (six) hours as needed for wheezing or shortness of breath. 8.5 g 5  . amLODipine (NORVASC) 10 MG tablet Take 10 mg by mouth daily.    Marland Kitchen aspirin EC 81 MG tablet Take 81 mg by mouth every morning.     . canagliflozin (INVOKANA) 300 MG TABS tablet Take 300 mg by mouth daily. 90 tablet 3  . DULoxetine (CYMBALTA) 60 MG capsule Take 1 capsule (60 mg total) by mouth daily. 30 capsule 11  . fluticasone-salmeterol (ADVAIR HFA) 115-21 MCG/ACT inhaler Inhale 2 puffs into the lungs 2 (two) times daily. 1 Inhaler 0  . gabapentin (NEURONTIN) 100 MG capsule Take 300 mg by mouth 2 (two) times daily.     Marland Kitchen glucose blood test strip Use TID  E11.9 100 each 12  . Insulin Glargine (TOUJEO SOLOSTAR) 300 UNIT/ML SOPN Inject 50 Units into the skin daily. 1.5 mL 11  . Insulin Lispro, Human, (HUMALOG KWIKPEN) 200 UNIT/ML SOPN Inject 5 Units into the skin 3 (three) times daily with meals. 9 mL 3  . ketoconazole (NIZORAL) 2 % cream Apply 1 application topically daily. 60 g 2  . levothyroxine (SYNTHROID, LEVOTHROID) 75 MCG tablet Take 1 tablet (75 mcg total) by mouth daily before breakfast. 90 tablet 1  . metoprolol succinate (TOPROL-XL) 50 MG 24 hr tablet TAKE ONE TABLET BY MOUTH TWICE DAILY, TAKE WITH A MEAL OR IMMEDIATELY FOLLOWING A MEAL. 60 tablet 11  . Multiple Vitamins-Minerals (MULTIVITAMIN PO) Take 1 tablet by mouth daily.    . Omega-3 Fatty Acids (FISH OIL PO) Take 1 capsule by mouth daily.    . primidone (MYSOLINE) 50 MG tablet TAKE THREE  TABLETS BY MOUTH TWICE DAILY 180 tablet 3  . RELION INSULIN SYR 1CC/30G 30G X 5/16" 1 ML MISC   0  . sertraline (ZOLOFT) 100 MG tablet Take one and one half tablets daily (Patient taking differently: Take 100 mg by mouth at bedtime. ) 45 tablet 2  . tiZANidine (ZANAFLEX) 4 MG tablet Take 4 mg by mouth 3 (three) times daily.    Marland Kitchen albuterol (PROVENTIL) (2.5 MG/3ML) 0.083% nebulizer solution Take 3 mLs (2.5 mg total) by nebulization every 6 (six) hours as needed for wheezing or shortness of breath. 360 mL 3  . HYDROcodone-acetaminophen (NORCO) 10-325 MG per tablet Take 1 tablet by mouth every 6 (six) hours as needed.    . pantoprazole (PROTONIX) 40 MG tablet Take 1 tablet (40 mg total) by mouth daily. 30 tablet 0  . ranitidine (ZANTAC) 150 MG tablet Take 150 mg by mouth 2 (two) times daily.     Marland Kitchen triamterene-hydrochlorothiazide (MAXZIDE-25) 37.5-25 MG per tablet Take 1 tablet by mouth daily.     No facility-administered medications prior to visit.    ROS Review of Systems  Constitutional: Negative.  Negative for fever, chills, diaphoresis, appetite change and fatigue.  HENT: Negative.   Eyes: Negative.   Respiratory: Negative.  Negative for cough, choking, chest tightness, shortness of breath and stridor.   Cardiovascular:  Negative.  Negative for palpitations and leg swelling.  Gastrointestinal: Negative.  Negative for nausea, vomiting, abdominal pain, diarrhea, constipation and blood in stool.  Endocrine: Negative.  Negative for polydipsia, polyphagia and polyuria.  Genitourinary: Negative.   Musculoskeletal: Positive for back pain and arthralgias.  Skin: Negative.   Allergic/Immunologic: Negative.   Neurological: Negative.  Negative for dizziness and light-headedness.  Hematological: Negative for adenopathy. Does not bruise/bleed easily.  Psychiatric/Behavioral: Negative.     Objective:  BP 122/84 mmHg  Pulse 80  Temp(Src) 98.8 F (37.1 C) (Oral)  Resp 16  Ht 5' 4.5" (1.638 m)   Wt 191 lb (86.637 kg)  BMI 32.29 kg/m2  SpO2 96%  BP Readings from Last 3 Encounters:  08/21/14 122/84  08/20/14 131/96  07/02/14 120/70    Wt Readings from Last 3 Encounters:  08/21/14 191 lb (86.637 kg)  08/19/14 210 lb (95.255 kg)  07/02/14 204 lb (92.534 kg)    Physical Exam  Constitutional: [name] is oriented to person, place, and time. [name] appears well-developed and well-nourished.  Non-toxic appearance. [name] has a sickly appearance. [name] does not appear ill. No distress.  Wheel-chair bound  HENT:  Head: Normocephalic and atraumatic.  Mouth/Throat: Oropharynx is clear and moist. No oropharyngeal exudate.  Eyes: Conjunctivae are normal. Right eye exhibits no discharge. Left eye exhibits no discharge. No scleral icterus.  Neck: Normal range of motion. Neck supple. No JVD present. No tracheal deviation present. No thyromegaly present.  Cardiovascular: Normal rate, regular rhythm, normal heart sounds and intact distal pulses.  Exam reveals no gallop and no friction rub.   No murmur heard. Pulmonary/Chest: Effort normal and breath sounds normal. No stridor. No respiratory distress. [name] has no wheezes. [name] has no rales. [name] exhibits no tenderness.  Abdominal: Soft. Bowel sounds are normal. [name] exhibits no distension and no mass. There is no tenderness. There is no rebound and no guarding.  Musculoskeletal: Normal range of motion. [name] exhibits no edema or tenderness.  Lymphadenopathy:    [name] has no cervical adenopathy.  Neurological: [name] is oriented to person, place, and time.  Skin: Skin is warm and dry. No rash noted. [name] is not diaphoretic. No erythema. No pallor.  Psychiatric: [name] has a normal mood and affect. Her behavior is normal. Judgment and thought content normal.  Vitals reviewed.   Lab Results  Component Value Date   WBC 9.1 08/19/2014   HGB 14.4 08/19/2014   HCT 41.5 08/19/2014   PLT 230 08/19/2014   GLUCOSE 309* 08/19/2014   CHOL 198 02/12/2014   TRIG 180.0*  02/12/2014   HDL 68.20 02/12/2014   LDLCALC 94 02/12/2014   ALT 33 08/19/2014   AST 21 08/19/2014   NA 131* 08/19/2014   K 4.2 08/19/2014   CL 92* 08/19/2014   CREATININE 0.77 08/19/2014   BUN 17 08/19/2014   CO2 29 08/19/2014   TSH 0.80 05/30/2014   INR 1.0 02/27/2008   HGBA1C 8.3* 05/30/2014   MICROALBUR 0.7 10/27/2013    Dg Abd Acute W/chest  08/20/2014   CLINICAL DATA:  Generalized abdominal pain for 1 night. Mid chest pain, nausea and cough.  EXAM: DG ABDOMEN ACUTE W/ 1V CHEST  COMPARISON:  Chest radiograph 02/19/2014  FINDINGS: Unchanged elevation of right hemidiaphragm. The cardiomediastinal contours are normal. The lungs are clear. Battery packs overlie the right and left chest wall with leads extending in the neck. There is no free intra-abdominal air. No dilated bowel loops to suggest obstruction. Moderate volume of stool  throughout the colon. No radiopaque calculi. Surgical clips in the right upper quadrant of the abdomen likely from cholecystectomy. No acute osseous abnormalities are seen. Postsurgical change noted in the lower lumbar spine.  IMPRESSION: Normal bowel gas pattern with moderate volume of colonic stool.   Electronically Signed   By: Jeb Levering M.D.   On: 08/20/2014 02:32    Assessment & Plan:   [name] was seen today for back pain, gastrophageal reflux, diabetes and hypothyroidism.  Diagnoses and all orders for this visit:  Gastroesophageal reflux disease with esophagitis Orders: -     omeprazole (PRILOSEC) 40 MG capsule; Take 1 capsule (40 mg total) by mouth daily.  Spinal stenosis of lumbar region Orders: -     HYDROcodone-acetaminophen (NORCO) 10-325 MG per tablet; Take 1 tablet by mouth every 6 (six) hours as needed.  Visit for screening mammogram Orders: -     MM DIGITAL SCREENING BILATERAL; Future   I have discontinued [name]'s ranitidine, triamterene-hydrochlorothiazide, and pantoprazole. I am also having her start on omeprazole.  Additionally, I am having her maintain her aspirin EC, albuterol, gabapentin, sertraline, levothyroxine, glucose blood, Multiple Vitamins-Minerals (MULTIVITAMIN PO), Omega-3 Fatty Acids (FISH OIL PO), metoprolol succinate, tiZANidine, DULoxetine, ketoconazole, RELION INSULIN SYR 1CC/30G, amLODipine, fluticasone-salmeterol, Insulin Glargine, Insulin Lispro (Human), canagliflozin, primidone, and HYDROcodone-acetaminophen.  Meds ordered this encounter  Medications  . omeprazole (PRILOSEC) 40 MG capsule    Sig: Take 1 capsule (40 mg total) by mouth daily.    Dispense:  30 capsule    Refill:  11  . HYDROcodone-acetaminophen (NORCO) 10-325 MG per tablet    Sig: Take 1 tablet by mouth every 6 (six) hours as needed.    Dispense:  75 tablet    Refill:  0     Follow-up: Return in about 2 months (around 10/21/2014).  Scarlette Calico, MD

## 2014-08-24 ENCOUNTER — Telehealth: Payer: Self-pay | Admitting: Internal Medicine

## 2014-08-27 ENCOUNTER — Encounter: Payer: Self-pay | Admitting: Internal Medicine

## 2014-08-27 ENCOUNTER — Ambulatory Visit (INDEPENDENT_AMBULATORY_CARE_PROVIDER_SITE_OTHER): Payer: Medicare Other | Admitting: Internal Medicine

## 2014-08-27 VITALS — BP 120/74 | HR 94 | Ht 64.5 in | Wt 190.2 lb

## 2014-08-27 DIAGNOSIS — IMO0001 Reserved for inherently not codable concepts without codable children: Secondary | ICD-10-CM

## 2014-08-27 DIAGNOSIS — J4520 Mild intermittent asthma, uncomplicated: Secondary | ICD-10-CM | POA: Diagnosis not present

## 2014-08-27 DIAGNOSIS — J302 Other seasonal allergic rhinitis: Secondary | ICD-10-CM

## 2014-08-27 DIAGNOSIS — J309 Allergic rhinitis, unspecified: Secondary | ICD-10-CM | POA: Diagnosis not present

## 2014-08-27 DIAGNOSIS — J3089 Other allergic rhinitis: Secondary | ICD-10-CM

## 2014-08-27 MED ORDER — TIOTROPIUM BROMIDE MONOHYDRATE 2.5 MCG/ACT IN AERS: 2.0000 | INHALATION_SPRAY | Freq: Every day | RESPIRATORY_TRACT | Status: DC

## 2014-08-27 MED ORDER — AZELASTINE-FLUTICASONE 137-50 MCG/ACT NA SUSP: 1.0000 | Freq: Every day | NASAL | Status: DC
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
## 2014-08-27 NOTE — Patient Instructions (Addendum)
Sample Dymista nasal spray- try 1-2 puffs each nostril once daily at  Bedtime  Sample Spiriva Respimat  2 puffs, once daily. You can take this with your Advair.  Ok to use the throat lozenges and sips of liquids if they help.  You can try gargling with salt water  Consider trying otc Mucinex-DM  To loosen the mucus and calm cough  Keep September 28 appointment unless you need help sooner

## 2014-08-28 ENCOUNTER — Telehealth: Payer: Self-pay | Admitting: Internal Medicine

## 2014-08-28 NOTE — Telephone Encounter (Signed)
Spoke with pt. States that her insurance denied payment on Prevnar 13. They advised her that it had not been a year since the last PNA shot she got. Advised her that according to our records that her last PNA shot prior to the Prevnar was in 2012. She asks for a copy of her immunization record be left at the front desk for pick up. This will be placed up front. Nothing further was needed.

## 2014-08-28 NOTE — Assessment & Plan Note (Signed)
Heart to tell if she's had a recent allergic or viral upper respiratory illness causing hoarseness and some increase in her asthma with yellow sputum Plan-antihistamines and Flonase nasal spray.

## 2014-08-28 NOTE — Assessment & Plan Note (Signed)
She has not had a significant recent exacerbation. I'm concerned because for back pain and hope she doesn't have to cough much at some point. Plan-sample Spiriva Respimat

## 2014-08-29 ENCOUNTER — Encounter: Payer: Self-pay | Admitting: Neurology

## 2014-08-29 ENCOUNTER — Other Ambulatory Visit: Payer: Self-pay | Admitting: Internal Medicine

## 2014-08-29 ENCOUNTER — Ambulatory Visit (INDEPENDENT_AMBULATORY_CARE_PROVIDER_SITE_OTHER): Payer: Medicare Other | Admitting: Neurology

## 2014-08-29 VITALS — BP 120/74 | HR 92

## 2014-08-29 DIAGNOSIS — E1342 Other specified diabetes mellitus with diabetic polyneuropathy: Secondary | ICD-10-CM

## 2014-08-29 DIAGNOSIS — E1142 Type 2 diabetes mellitus with diabetic polyneuropathy: Secondary | ICD-10-CM

## 2014-08-29 DIAGNOSIS — Z9889 Other specified postprocedural states: Secondary | ICD-10-CM | POA: Diagnosis not present

## 2014-08-29 DIAGNOSIS — W19XXXD Unspecified fall, subsequent encounter: Secondary | ICD-10-CM

## 2014-08-29 DIAGNOSIS — Z9689 Presence of other specified functional implants: Secondary | ICD-10-CM

## 2014-08-29 DIAGNOSIS — G629 Polyneuropathy, unspecified: Secondary | ICD-10-CM

## 2014-08-29 DIAGNOSIS — G250 Essential tremor: Secondary | ICD-10-CM | POA: Diagnosis not present

## 2014-08-29 MED ORDER — GABAPENTIN 300 MG PO CAPS: ORAL_CAPSULE | ORAL | Status: DC

## 2014-08-29 NOTE — Progress Notes (Signed)
Subjective:   [name] was seen in f/u today.  DBS surgery to the bilateral VIM done 12/04/13 with bilateral generator placement on 12/15/13.  She has had several falls.  With several of these, she picked up something and fell when bending over.  The L leg is also giving way and seems a bit weak.  She states that there is some numbness in the top of the left leg as well.  There has been some back pain as well.  With each fall, the left leg ends up underneath her.  Tremor has been better.  Speech stable.   Admits that she has been scratching at the battery area and it has been red because of it.    01/10/14 update:  Overall, pt has been doing well.  She had another fall since last visit.   After she got the walker, she hasn't fallen since.   Is very happy with the DBS surgery.   Pt states that she can carry a cup with open lids now.  Has back pain and L thigh pain.  Had EMG today.  D/W Dr. Posey Pronto.  Acute radiculopathy, primarily of L4.  03/08/14 update:  The patient is seen back in follow-up, accompanied by her husband who supplements the history.  Records since last visit were reviewed.  The patient was seen in the emergency room on 01/21/2014 secondary to angioedema, which was presumed secondary to her lisinopril which was discontinued.  She was seen back in the emergency room on 01/29/2014 with an asthma attack and the next day she was seen there with high blood sugars due to the prednisone that was given for her asthma attack.  She was again seen in the emergency room on 02/19/2014 with chest pain, that was ultimately felt musculoskeletal secondary to coughing.  In regards to tremor, the patient states that she has overall been doing well.  She is happy with results of surgery.  She does note some facial tremor.  Pt does report that she had an injection in her back since last visit, done by Dr. Vertell Limber.  She states that her back is doing much better but now the same knee is giving out.  She fell Saturday  as wasn't using her cane or walker and her knee gave out.   08/29/14 update:  The patient is seen today in follow-up, accompanied by her husband who supplements the history.  Records were reviewed since last visit.  She has a history of essential tremor and last visit I decreased her primidone from 150 mg twice a day to 100 mg twice a day.  She has done well in that regard.  Right hand tremor is well controlled but she still has some tremor in the left hand.  She has more trouble clipping nails with the left hand.  Admits that tremor is better when she has BS under better controlled and is trying to work on that.  States that she has lost 21 lbs doing this.  Prior to that admits that she wasn't controlling BS with diet.  Since our last visit, she had back surgery for an L4 radiculopathy.  This was done by Dr. Vertell Limber on 04/24/2014.  Unfortunately, she continues to have falls, but the back pain that was present prior to surgery went away.  Dr. Vertell Limber felt perhaps the falls were caused by knee pain and she was to follow-up with Dr. Percell Miller.  She saw him about a month ago.  She had injection in  the L knee and a brace was put on it and it seemed somewhat helpful, but now the right knee is giving way.  She does think that the falls are from the knee.  She has been in the emergency room several times with complaints of leg pain, but she states that this is not the same leg pain as she had prior to surgery.  This time, the pain is in both legs and involves the whole legs.  Records suggested this was in the hips distally but she states that it is below the knees primarily and it is a stabbing pain. Dr. Vertell Limber felt that it was diabetic neuropathy.  She was in the emergency room on both February 6 and February 29 complaining about this pain.  She is also in the emergency room on May 16, but this was for chest and abdominal pain and it was relieved with hydrocodone and a GI cocktail.  She has been in physical therapy and finished  2 weeks ago.  She uses the walker most of the time but uses the wheelchair when out.     Allergies  Allergen Reactions  . Losartan     Angioedema   . Metformin And Related Diarrhea  . Latex Other (See Comments)    Other Reaction: redness, blisters  . Morphine And Related Other (See Comments)    Pt. States med makes her crazy  . Other Other (See Comments)    Other Reaction: redness, blisters  . Oxycodone-Acetaminophen Itching    TYLOX. Caused internal itching per patient     Outpatient Encounter Prescriptions as of 08/29/2014  Medication Sig  . albuterol (PROAIR HFA) 108 (90 BASE) MCG/ACT inhaler Inhale 2 puffs into the lungs every 6 (six) hours as needed for wheezing or shortness of breath.  Marland Kitchen amLODipine (NORVASC) 10 MG tablet Take 10 mg by mouth daily.  Marland Kitchen aspirin EC 81 MG tablet Take 81 mg by mouth every morning.   . Azelastine-Fluticasone 137-50 MCG/ACT SUSP Place 1-2 puffs into the nose at bedtime.  . canagliflozin (INVOKANA) 300 MG TABS tablet Take 300 mg by mouth daily.  . DULoxetine (CYMBALTA) 60 MG capsule Take 1 capsule (60 mg total) by mouth daily.  . fluticasone-salmeterol (ADVAIR HFA) 115-21 MCG/ACT inhaler Inhale 2 puffs into the lungs 2 (two) times daily.  Marland Kitchen gabapentin (NEURONTIN) 100 MG capsule Take 300 mg by mouth 2 (two) times daily.   Marland Kitchen glucose blood test strip Use TID  E11.9  . HYDROcodone-acetaminophen (NORCO) 10-325 MG per tablet Take 1 tablet by mouth every 6 (six) hours as needed.  . Insulin Glargine (TOUJEO SOLOSTAR) 300 UNIT/ML SOPN Inject 50 Units into the skin daily.  . Insulin Lispro, Human, (HUMALOG KWIKPEN) 200 UNIT/ML SOPN Inject 5 Units into the skin 3 (three) times daily with meals.  Marland Kitchen ketoconazole (NIZORAL) 2 % cream Apply 1 application topically daily.  Marland Kitchen levothyroxine (SYNTHROID, LEVOTHROID) 75 MCG tablet TAKE ONE TABLET BY MOUTH ONCE DAILY BEFORE BREAKFAST  . metoprolol succinate (TOPROL-XL) 50 MG 24 hr tablet TAKE ONE TABLET BY MOUTH TWICE  DAILY, TAKE WITH A MEAL OR IMMEDIATELY FOLLOWING A MEAL.  . Multiple Vitamins-Minerals (MULTIVITAMIN PO) Take 1 tablet by mouth daily.  . Omega-3 Fatty Acids (FISH OIL PO) Take 1 capsule by mouth daily.  Marland Kitchen omeprazole (PRILOSEC) 40 MG capsule Take 1 capsule (40 mg total) by mouth daily.  . primidone (MYSOLINE) 50 MG tablet TAKE THREE TABLETS BY MOUTH TWICE DAILY (Patient taking differently: TAKE Two TABLETS  BY MOUTH TWICE DAILY)  . RELION INSULIN SYR 1CC/30G 30G X 5/16" 1 ML MISC   . sertraline (ZOLOFT) 100 MG tablet Take one and one half tablets daily (Patient taking differently: Take 100 mg by mouth at bedtime. )  . Tiotropium Bromide Monohydrate (SPIRIVA RESPIMAT) 2.5 MCG/ACT AERS Inhale 2 puffs into the lungs daily.  Marland Kitchen tiZANidine (ZANAFLEX) 4 MG tablet Take 4 mg by mouth 3 (three) times daily.   No facility-administered encounter medications on file as of 08/29/2014.    Past Medical History  Diagnosis Date  . 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 [name], residual palpitations  . Vocal cord dysfunction   . Tremor, essential     takes Mysoline and Neurontin daily.  . Allergic rhinitis     uses Nasonex daily as needed  . Low sodium syndrome   . Chronic back pain     reason unknown  . Lumbar radiculopathy   . DDD (degenerative disc disease), lumbar   . Esophageal reflux     takes Zantac daily  . Hypertension     takes Losartan daily  . Anxiety     takes Ativan daily as needed  . Hypothyroidism     takes Synthroid daily  . Depression     takes Zoloft daily as well as Cymbalta  . Diabetes mellitus     takes NOvolin daily  . Asthma     Albuterol inhaler and Neb daily as needed  . History of bronchitis Dec 2014  . Seizures 04-05-13    one time ran out of Primidone and had stopped it cold [place]  . Weakness     in left arm  . Joint pain   . Joint swelling     left ankle  . Dysphagia   . Constipation   . Allergy to angiotensin  receptor blockers (ARB) 01/22/2014    Angioedema  . 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 Rayann Heman 2007    Past Surgical History  Procedure Laterality Date  . Tonsillectomy    . Right breast cyst      benign  . Left ankle ligament repair      x 2  . Left elbow repair    . Partial hysterectomy    . Cholecystectomy    . Cesarean section    . Ablation for avnrt    . Colonoscopy  01/16/2004    SHF:WYOVZC rectum/colon  . Esophagogastroduodenoscopy (egd) with esophageal dilation  04/03/2002    HYI:FOYDXAJO'I ring, otherwise normal esophagus, status post dilation with 54 F/Normal stomach  . Bartholin gland cyst excision      x 2  . Esophagogastroduodenoscopy (egd) with esophageal dilation N/A 11/08/2012    Procedure: ESOPHAGOGASTRODUODENOSCOPY (EGD) WITH ESOPHAGEAL DILATION;  Surgeon: Daneil Dolin, MD;  Location: [facility];  Service: Endoscopy;  Laterality: N/A;  11:30  . Subthalamic stimulator insertion Bilateral 12/04/2013    Procedure: Bilateral Deep brain stimulator placement;  Surgeon: [name], MD;  Location: [facility];  Service: Neurosurgery;  Laterality: Bilateral;  Bilateral Deep brain stimulator placement  . Pulse generator implant Bilateral 12/15/2013    Procedure: BILATERAL PULSE GENERATOR IMPLANT;  Surgeon: [name], MD;  Location: [facility];  Service: Neurosurgery;  Laterality: Bilateral;  BILATERAL  . Maximum access (mas)posterior lumbar interbody fusion (plif) 1 level N/A 04/24/2014    Procedure: Lumbar four-five Maximum access Surgery posterior lumbar interbody fusion;  Surgeon:  [name], MD;  Location: [facility];  Service: Neurosurgery;  Laterality: N/A;  Lumbar four-five Maximum access Surgery posterior lumbar interbody fusion    History   Social History  . Marital Status: Married    Spouse Name: N/A  . Number of Children: N/A  . Years of Education: N/A   Occupational History  . retired    Social History Main Topics  . Smoking status:  Former Smoker -- 0.30 packs/day for 10 years    Types: Cigarettes    Quit date: 04/23/1993  . Smokeless tobacco: Never Used     Comment: quit smoking 25+yrs ago  . Alcohol Use: No     Comment: no alcohol in 70yrs  . Drug Use: No  . Sexual Activity: No   Other Topics Concern  . Not on file   Social History Narrative   Married, 1 daughte, living.  Lives with husband in [place], [place].   1 child died brain tumor at age 46.   Two grandchildren.   Works at [employer], patient currently lost her job.   Retired 2011.   No tobacco.   Alcohol: none in 30 yrs.  Distant history of heavy use.   No drug use.    Family Status  Relation Status Death Age  . Father Deceased     lung cancer, diabetes  . Mother Deceased     COPD, diabetes  . Son Deceased 7    brain tumor (neuroblastoma)  . Sister Deceased     blood clot  . Daughter Alive     healthy    Review of Systems A complete 10 system ROS was obtained and was negative apart from what is mentioned.   Objective:   VITALS:   Filed Vitals:   08/29/14 0932  BP: 120/74  Pulse: 92   Gen: Appears stated age and in NAD.  HEENT: Normocephalic.  Scalp incisions are well healed.  The mucous membranes are moist. The superficial temporal arteries are without ropiness or tenderness.  Cardiovascular: Regular rate and rhythm.  Lungs: Clear to auscultation bilaterally.  Neck: There are no carotid bruits noted bilaterally.  Skin: incisions are all well healed now  NEUROLOGICAL:  Orientation: The patient is alert and oriented x 3.  Cranial nerves: There is good facial symmetry.  Speech is fluent and clear. There is a vocal tremor and just slight pseudobulbar quality to the voice. Soft palate rises symmetrically and there is no tongue deviation. Hearing is intact to conversational tone.  Tone: Tone is good throughout.  Sensation: Sensation is decreased over the L anterior medial and lateral thigh (no particular dermatomal level  though) compared to the R Coordination: The patient has no dysdiadichokinesia or dysmetria.  Motor: Strength is at least antigravity x 4 Gait and Station: The patient is not particularly unstable today (states that she isn't when on level surface) DTR's:  2-/4 at the bilateral biceps, triceps, brachioradialis, right patella, absent at the left patella and bilateral Achilles MOVEMENT EXAM:  Tremor: There is tremor in the UE, most evident on the left with intention..  Very little on the right although had trouble with archimedes spirals on the right pre-programming..There is head tremor at rest that is complex head titubation.   DBS programming was performed today which is described in more detail in a separate programming procedural notes.  In brief, there was improvement following programming    Assessment/Plan:   1.  Essential Tremor.  -This is evidenced by the symmetrical  nature and longstanding hx of gradually getting worse.  -The patient is status post bilateral VIM DBS on 12/04/2013 with bilateral generator placement on 12/15/2013.  Postoperative imaging indicates that leads are perhaps just slightly too lateral and more inferior than anticipated.  -DBS device was programmed again today.  Neither side turned up high but R sided tremor well controlled.  Programming more challenging on the R brain (L body) due to lead position and SE with programming of dyskinesia and face pull.  Programming device slowly on that side and pt happy with results.  -continue 100 mg bid 2.  Diabetic peripheral neuropathy  -neurontin increase - 300 mg in the AM and 600 at night.  Risks, benefits, side effects and alternative therapies were discussed.  The opportunity to ask questions was given and they were answered to the best of my ability.  The patient expressed understanding and willingness to follow the outlined treatment protocols. 3.  L4 radiculopathy  -S/P surgery and back pain better but still falling  because of knee pain.  Following with Dr. Percell Miller 4.  F/u with me in next 8 weeks.

## 2014-08-29 NOTE — Procedures (Signed)
DBS Programming was performed.    Total time spent programming was 45 minutes.  Device was confirmed to be on.  Soft start was confirmed to be on.  Impedences were checked and were within normal limits.  Battery was checked and was determined to be functioning normally and not near the end of life.  Final settings were as follows:  Left brain electrode:     1-2+           ; Amplitude  2.4   V   ; Pulse width 90 microseconds;   Frequency   140   Hz.  Right brain electrode:     2-1+          ; Amplitude   2.5  V ;  Pulse width 60  microseconds;  Frequency   130    Hz.

## 2014-08-29 NOTE — Patient Instructions (Signed)
1. Increase Gabapentin to 300 mg in the morning and 600 mg at night.

## 2014-08-31 ENCOUNTER — Telehealth: Payer: Self-pay | Admitting: Internal Medicine

## 2014-08-31 MED ORDER — PREDNISONE 10 MG PO TABS: ORAL_TABLET | ORAL | Status: DC

## 2014-08-31 NOTE — Telephone Encounter (Signed)
Offer prednisone 10 mg, # 20, 4 X 2 DAYS, 3 X 2 DAYS, 2 X 2 DAYS, 1 X 2 DAYS  

## 2014-08-31 NOTE — Telephone Encounter (Signed)
Patient notified.  Nothing further needed. 

## 2014-08-31 NOTE — Telephone Encounter (Signed)
Patient is having more asthma flare ups today.  She is wheezing a lot, has used inhaler the way she is supposed to. Coughing up a lot of clear mucus. She would like Prednisone called into [facility] in [place].   Allergies  Allergen Reactions  . Losartan     Angioedema   . Metformin And Related Diarrhea  . Latex Other (See Comments)    Other Reaction: redness, blisters  . Morphine And Related Other (See Comments)    Pt. States med makes her crazy  . Other Other (See Comments)    Other Reaction: redness, blisters  . Oxycodone-Acetaminophen Itching    TYLOX. Caused internal itching per patient    Current Outpatient Prescriptions on File Prior to Visit  Medication Sig Dispense Refill  . albuterol (PROAIR HFA) 108 (90 BASE) MCG/ACT inhaler Inhale 2 puffs into the lungs every 6 (six) hours as needed for wheezing or shortness of breath. 8.5 g 5  . amLODipine (NORVASC) 10 MG tablet Take 10 mg by mouth daily.    Marland Kitchen aspirin EC 81 MG tablet Take 81 mg by mouth every morning.     . Azelastine-Fluticasone 137-50 MCG/ACT SUSP Place 1-2 puffs into the nose at bedtime. 6 g 0  . canagliflozin (INVOKANA) 300 MG TABS tablet Take 300 mg by mouth daily. 90 tablet 3  . DULoxetine (CYMBALTA) 60 MG capsule Take 1 capsule (60 mg total) by mouth daily. 30 capsule 11  . fluticasone-salmeterol (ADVAIR HFA) 115-21 MCG/ACT inhaler Inhale 2 puffs into the lungs 2 (two) times daily. 1 Inhaler 0  . gabapentin (NEURONTIN) 300 MG capsule Take 1 tablet in the morning, 2 at night 270 capsule 1  . glucose blood test strip Use TID  E11.9 100 each 12  . HYDROcodone-acetaminophen (NORCO) 10-325 MG per tablet Take 1 tablet by mouth every 6 (six) hours as needed. 75 tablet 0  . Insulin Glargine (TOUJEO SOLOSTAR) 300 UNIT/ML SOPN Inject 50 Units into the skin daily. 1.5 mL 11  . Insulin Lispro, Human, (HUMALOG KWIKPEN) 200 UNIT/ML SOPN Inject 5 Units into the skin 3 (three) times daily with meals. 9 mL 3  . ketoconazole (NIZORAL) 2 %  cream Apply 1 application topically daily. 60 g 2  . levothyroxine (SYNTHROID, LEVOTHROID) 75 MCG tablet TAKE ONE TABLET BY MOUTH ONCE DAILY BEFORE BREAKFAST 90 tablet 2  . metoprolol succinate (TOPROL-XL) 50 MG 24 hr tablet TAKE ONE TABLET BY MOUTH TWICE DAILY, TAKE WITH A MEAL OR IMMEDIATELY FOLLOWING A MEAL. 60 tablet 11  . Multiple Vitamins-Minerals (MULTIVITAMIN PO) Take 1 tablet by mouth daily.    . Omega-3 Fatty Acids (FISH OIL PO) Take 1 capsule by mouth daily.    Marland Kitchen omeprazole (PRILOSEC) 40 MG capsule Take 1 capsule (40 mg total) by mouth daily. 30 capsule 11  . primidone (MYSOLINE) 50 MG tablet TAKE THREE TABLETS BY MOUTH TWICE DAILY (Patient taking differently: TAKE Two TABLETS BY MOUTH TWICE DAILY) 180 tablet 3  . RELION INSULIN SYR 1CC/30G 30G X 5/16" 1 ML MISC   0  . sertraline (ZOLOFT) 100 MG tablet Take one and one half tablets daily (Patient taking differently: Take 100 mg by mouth at bedtime. ) 45 tablet 2  . Tiotropium Bromide Monohydrate (SPIRIVA RESPIMAT) 2.5 MCG/ACT AERS Inhale 2 puffs into the lungs daily. 1 Inhaler 0  . tiZANidine (ZANAFLEX) 4 MG tablet Take 4 mg by mouth 3 (three) times daily.     No current facility-administered medications on file prior to visit.

## 2014-08-31 NOTE — Telephone Encounter (Signed)
Rx sent to pharmacy.  Attempted to contact patient, unable to reach patient, no voicemail. Will call back.

## 2014-09-08 ENCOUNTER — Telehealth: Payer: Self-pay | Admitting: Internal Medicine

## 2014-09-08 NOTE — Telephone Encounter (Signed)
Called reporting dry hacking cough no better on prednisone Breathing ok at rest/ no change symptoms p saba Has norco for back pain didn't know it worked for cough too  Suggested she try this and regroup early next week to sort out why coughing ? Adverse effect of advair ? Non-acid gerd?

## 2014-09-10 ENCOUNTER — Other Ambulatory Visit: Payer: Self-pay

## 2014-09-10 ENCOUNTER — Encounter (HOSPITAL_COMMUNITY): Payer: Self-pay | Admitting: *Deleted

## 2014-09-10 ENCOUNTER — Emergency Department (HOSPITAL_COMMUNITY)
Admission: EM | Admit: 2014-09-10 | Discharge: 2014-09-10 | Disposition: A | Discharge status: Home / Self Care | Payer: Medicare Other | Attending: Emergency Medicine | Admitting: Emergency Medicine

## 2014-09-10 ENCOUNTER — Emergency Department (HOSPITAL_COMMUNITY): Payer: Medicare Other

## 2014-09-10 DIAGNOSIS — I499 Cardiac arrhythmia, unspecified: Secondary | ICD-10-CM | POA: Insufficient documentation

## 2014-09-10 DIAGNOSIS — Z7952 Long term (current) use of systemic steroids: Secondary | ICD-10-CM | POA: Insufficient documentation

## 2014-09-10 DIAGNOSIS — Z79899 Other long term (current) drug therapy: Secondary | ICD-10-CM | POA: Insufficient documentation

## 2014-09-10 DIAGNOSIS — Z7982 Long term (current) use of aspirin: Secondary | ICD-10-CM | POA: Diagnosis not present

## 2014-09-10 DIAGNOSIS — Z87891 Personal history of nicotine dependence: Secondary | ICD-10-CM | POA: Diagnosis not present

## 2014-09-10 DIAGNOSIS — Z7951 Long term (current) use of inhaled steroids: Secondary | ICD-10-CM | POA: Insufficient documentation

## 2014-09-10 DIAGNOSIS — Z91040 Latex allergy status: Secondary | ICD-10-CM | POA: Insufficient documentation

## 2014-09-10 DIAGNOSIS — R600 Localized edema: Secondary | ICD-10-CM | POA: Diagnosis not present

## 2014-09-10 DIAGNOSIS — K5900 Constipation, unspecified: Secondary | ICD-10-CM | POA: Insufficient documentation

## 2014-09-10 DIAGNOSIS — J45901 Unspecified asthma with (acute) exacerbation: Secondary | ICD-10-CM | POA: Diagnosis not present

## 2014-09-10 DIAGNOSIS — E119 Type 2 diabetes mellitus without complications: Secondary | ICD-10-CM | POA: Diagnosis not present

## 2014-09-10 DIAGNOSIS — E871 Hypo-osmolality and hyponatremia: Secondary | ICD-10-CM | POA: Diagnosis not present

## 2014-09-10 DIAGNOSIS — F329 Major depressive disorder, single episode, unspecified: Secondary | ICD-10-CM | POA: Diagnosis not present

## 2014-09-10 DIAGNOSIS — Z8739 Personal history of other diseases of the musculoskeletal system and connective tissue: Secondary | ICD-10-CM | POA: Diagnosis not present

## 2014-09-10 DIAGNOSIS — R0602 Shortness of breath: Secondary | ICD-10-CM | POA: Diagnosis present

## 2014-09-10 DIAGNOSIS — Z794 Long term (current) use of insulin: Secondary | ICD-10-CM | POA: Insufficient documentation

## 2014-09-10 DIAGNOSIS — G40909 Epilepsy, unspecified, not intractable, without status epilepticus: Secondary | ICD-10-CM | POA: Insufficient documentation

## 2014-09-10 DIAGNOSIS — F419 Anxiety disorder, unspecified: Secondary | ICD-10-CM | POA: Diagnosis not present

## 2014-09-10 DIAGNOSIS — I10 Essential (primary) hypertension: Secondary | ICD-10-CM | POA: Insufficient documentation

## 2014-09-10 DIAGNOSIS — K219 Gastro-esophageal reflux disease without esophagitis: Secondary | ICD-10-CM | POA: Insufficient documentation

## 2014-09-10 DIAGNOSIS — G8929 Other chronic pain: Secondary | ICD-10-CM | POA: Insufficient documentation

## 2014-09-10 LAB — BASIC METABOLIC PANEL
Anion gap: 13 (ref 5–15)
BUN: 19 mg/dL (ref 6–20)
CO2: 25 mmol/L (ref 22–32)
Calcium: 9.2 mg/dL (ref 8.9–10.3)
Chloride: 96 mmol/L — ABNORMAL LOW (ref 101–111)
Creatinine, Ser: 0.71 mg/dL (ref 0.44–1.00)
GFR calc Af Amer: >60 mL/min (ref >60)
GFR calc non Af Amer: >60 mL/min (ref >60)
Glucose, Bld: 252 mg/dL — ABNORMAL HIGH (ref 65–99)
Potassium: 3.4 mmol/L — ABNORMAL LOW (ref 3.5–5.1)
Sodium: 134 mmol/L — ABNORMAL LOW (ref 135–145)

## 2014-09-10 LAB — CBC WITH DIFFERENTIAL/PLATELET
Basophils Absolute: 0 10*3/uL (ref 0.0–0.1)
Basophils Relative: 0 % (ref 0–1)
Eosinophils Absolute: 0.3 10*3/uL (ref 0.0–0.7)
Eosinophils Relative: 2 % (ref 0–5)
HCT: 43.2 % (ref 36.0–46.0)
Hemoglobin: 15 g/dL (ref 12.0–15.0)
Lymphocytes Relative: 33 % (ref 12–46)
Lymphs Abs: 4.5 10*3/uL — ABNORMAL HIGH (ref 0.7–4.0)
MCH: 31.5 pg (ref 26.0–34.0)
MCHC: 34.7 g/dL (ref 30.0–36.0)
MCV: 90.8 fL (ref 78.0–100.0)
Monocytes Absolute: 0.8 10*3/uL (ref 0.1–1.0)
Monocytes Relative: 6 % (ref 3–12)
Neutro Abs: 8.2 10*3/uL — ABNORMAL HIGH (ref 1.7–7.7)
Neutrophils Relative %: 59 % (ref 43–77)
Platelets: 208 10*3/uL (ref 150–400)
RBC: 4.76 MIL/uL (ref 3.87–5.11)
RDW: 13.2 % (ref 11.5–15.5)
WBC: 13.9 10*3/uL — ABNORMAL HIGH (ref 4.0–10.5)

## 2014-09-10 MED ORDER — FLUTICASONE FUROATE-VILANTEROL 200-25 MCG/INH IN AEPB: 1.0000 | INHALATION_SPRAY | Freq: Every day | RESPIRATORY_TRACT | Status: DC

## 2014-09-10 MED ORDER — IPRATROPIUM-ALBUTEROL 0.5-2.5 (3) MG/3ML IN SOLN
3.0000 mL | Freq: Once | RESPIRATORY_TRACT | Status: AC
  Administered 2014-09-10: 3 mL via RESPIRATORY_TRACT

## 2014-09-10 MED ORDER — PREDNISONE 50 MG PO TABS: ORAL_TABLET | ORAL | Status: DC

## 2014-09-10 MED ORDER — METHYLPREDNISOLONE SODIUM SUCC 125 MG IJ SOLR
125.0000 mg | Freq: Once | INTRAMUSCULAR | Status: AC
  Administered 2014-09-10: 125 mg via INTRAVENOUS

## 2014-09-10 MED ORDER — ALBUTEROL SULFATE (2.5 MG/3ML) 0.083% IN NEBU
2.5000 mg | INHALATION_SOLUTION | Freq: Once | RESPIRATORY_TRACT | Status: AC
  Administered 2014-09-10: 2.5 mg via RESPIRATORY_TRACT

## 2014-09-10 NOTE — ED Notes (Signed)
EKG given to EDP.  

## 2014-09-10 NOTE — Telephone Encounter (Signed)
Spoke with pt.  Discussed below per Dr. Annamaria Boots. Pt states she has used promethazine cough syrup in the past which helped cough.  She would like to try the [facility] first, though, in place of the Advair to see if this helps.  She is aware sample at front office for pick up and will call back with an update on Breo.  Pt verbalized understanding of instructions and voiced no further questions or concerns at this time.

## 2014-09-10 NOTE — Telephone Encounter (Signed)
Pt returning call.[name] ° °

## 2014-09-10 NOTE — ED Provider Notes (Signed)
CSN: [id number]     Arrival date & time 09/10/14  0056 History   First MD Initiated Contact with Patient 09/10/14 0118     Chief Complaint  Patient presents with  . Shortness of Breath    Patient is a 64 y.o. female presenting with shortness of breath. The history is provided by the patient.  Shortness of Breath Severity:  Moderate Onset quality:  Gradual Duration:  2 days Timing:  Intermittent Progression:  Worsening Chronicity:  Recurrent Relieved by:  Nothing Worsened by:  Nothing tried Associated symptoms: cough   Associated symptoms: no chest pain, no fever and no vomiting   Patient presents for increased cough/sob/wheezing for past 2 days She has h/o asthma and this feels similar to prior exacerbations No significant CP No fever/vomiting  Past Medical History  Diagnosis Date  . 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 [name], residual palpitations  . Vocal cord dysfunction   . Tremor, essential     takes Mysoline and Neurontin daily.  . Allergic rhinitis     uses Nasonex daily as needed  . Low sodium syndrome   . Chronic back pain     reason unknown  . Lumbar radiculopathy   . DDD (degenerative disc disease), lumbar   . Esophageal reflux     takes Zantac daily  . Hypertension     takes Losartan daily  . Anxiety     takes Ativan daily as needed  . Hypothyroidism     takes Synthroid daily  . Depression     takes Zoloft daily as well as Cymbalta  . Diabetes mellitus     takes NOvolin daily  . Asthma     Albuterol inhaler and Neb daily as needed  . History of bronchitis Dec 2014  . Seizures 04-05-13    one time ran out of Primidone and had stopped it cold [place]  . Weakness     in left arm  . Joint pain   . Joint swelling     left ankle  . Dysphagia   . Constipation   . Allergy to angiotensin receptor blockers (ARB) 01/22/2014    Angioedema  . 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 Rayann Heman 2007   Past  Surgical History  Procedure Laterality Date  . Tonsillectomy    . Right breast cyst      benign  . Left ankle ligament repair      x 2  . Left elbow repair    . Partial hysterectomy    . Cholecystectomy    . Cesarean section    . Ablation for avnrt    . Colonoscopy  01/16/2004    CWC:BJSEGB rectum/colon  . Esophagogastroduodenoscopy (egd) with esophageal dilation  04/03/2002    TDV:VOHYWVPX'T ring, otherwise normal esophagus, status post dilation with 54 F/Normal stomach  . Bartholin gland cyst excision      x 2  . Esophagogastroduodenoscopy (egd) with esophageal dilation N/A 11/08/2012    Procedure: ESOPHAGOGASTRODUODENOSCOPY (EGD) WITH ESOPHAGEAL DILATION;  Surgeon: Daneil Dolin, MD;  Location: [facility];  Service: Endoscopy;  Laterality: N/A;  11:30  . Subthalamic stimulator insertion Bilateral 12/04/2013    Procedure: Bilateral Deep brain stimulator placement;  Surgeon: [name], MD;  Location: [place] [facility];  Service: Neurosurgery;  Laterality: Bilateral;  Bilateral Deep brain stimulator placement  . Pulse generator implant Bilateral 12/15/2013    Procedure: BILATERAL PULSE GENERATOR IMPLANT;  Surgeon: [name], MD;  Location: [place] [facility];  Service: Neurosurgery;  Laterality: Bilateral;  BILATERAL  . Maximum access (mas)posterior lumbar interbody fusion (plif) 1 level N/A 04/24/2014    Procedure: Lumbar four-five Maximum access Surgery posterior lumbar interbody fusion;  Surgeon: [name], MD;  Location: [place] [facility];  Service: Neurosurgery;  Laterality: N/A;  Lumbar four-five Maximum access Surgery posterior lumbar interbody fusion   Family History  Problem Relation Age of Onset  . Lung cancer Father     DIED AGE 79 LUNG CA  . Alcohol abuse Father   . Anxiety disorder Father   . Depression Father   . COPD Mother     DIED AGE 60,MRSA,COPD,PNEUMONIA  . Pneumonia Mother     DIED AGE 60,MRSA,COPD,PNEUMONIA  . Supraventricular tachycardia Mother   . Anxiety  disorder Mother   . Depression Mother   . Alcohol abuse Mother   . COPD Brother     AGE 78  . Heart disease Sister     DIED AGE 70, SMOKER,?HEART  . Colon cancer Neg Hx    History  Substance Use Topics  . Smoking status: Former Smoker -- 0.30 packs/day for 10 years    Types: Cigarettes    Quit date: 04/23/1993  . Smokeless tobacco: Never Used     Comment: quit smoking 25+yrs ago  . Alcohol Use: No     Comment: no alcohol in 31yrs   OB History    No data available     Review of Systems  Constitutional: Negative for fever.  Respiratory: Positive for cough and shortness of breath.   Cardiovascular: Positive for leg swelling. Negative for chest pain.  Gastrointestinal: Negative for vomiting.  All other systems reviewed and are negative.     Allergies  Losartan; Metformin and related; Latex; Morphine and related; Other; and Oxycodone-acetaminophen  Home Medications   Prior to Admission medications   Medication Sig Start Date End Date Taking? Authorizing Provider  albuterol (PROAIR HFA) 108 (90 BASE) MCG/ACT inhaler Inhale 2 puffs into the lungs every 6 (six) hours as needed for wheezing or shortness of breath. 08/03/13   Tammy S Parrett, NP  amLODipine (NORVASC) 10 MG tablet Take 10 mg by mouth daily.    Historical Provider, MD  aspirin EC 81 MG tablet Take 81 mg by mouth every morning.     Historical Provider, MD  Azelastine-Fluticasone 137-50 MCG/ACT SUSP Place 1-2 puffs into the nose at bedtime. 08/27/14   Deneise Lever, MD  canagliflozin (INVOKANA) 300 MG TABS tablet Take 300 mg by mouth daily. 07/02/14   Janith Lima, MD  DULoxetine (CYMBALTA) 60 MG capsule Take 1 capsule (60 mg total) by mouth daily. 05/30/14 05/30/15  Janith Lima, MD  fluticasone-salmeterol (ADVAIR HFA) [phone] MCG/ACT inhaler Inhale 2 puffs into the lungs 2 (two) times daily. 07/02/14   Deneise Lever, MD  gabapentin (NEURONTIN) 300 MG capsule Take 1 tablet in the morning, 2 at night 08/29/14    Eustace Quail Tat, DO  glucose blood test strip Use TID  E11.9 04/10/14   Biagio Borg, MD  HYDROcodone-acetaminophen [facility]) 10-325 MG per tablet Take 1 tablet by mouth every 6 (six) hours as needed. 08/21/14   Janith Lima, MD  Insulin Glargine (TOUJEO SOLOSTAR) 300 UNIT/ML SOPN Inject 50 Units into the skin daily. 07/02/14   Janith Lima, MD  Insulin Lispro, Human, (HUMALOG KWIKPEN) 200 UNIT/ML SOPN Inject 5 Units into the skin 3 (three) times daily  with meals. 07/02/14   Janith Lima, MD  ketoconazole (NIZORAL) 2 % cream Apply 1 application topically daily. 05/30/14   Janith Lima, MD  levothyroxine (SYNTHROID, LEVOTHROID) 75 MCG tablet TAKE ONE TABLET BY MOUTH ONCE DAILY BEFORE BREAKFAST 08/29/14   Janith Lima, MD  metoprolol succinate (TOPROL-XL) 50 MG 24 hr tablet TAKE ONE TABLET BY MOUTH TWICE DAILY, TAKE WITH A MEAL OR IMMEDIATELY FOLLOWING A MEAL. 05/20/14   Janith Lima, MD  Multiple Vitamins-Minerals (MULTIVITAMIN PO) Take 1 tablet by mouth daily.    Historical Provider, MD  Omega-3 Fatty Acids (FISH OIL PO) Take 1 capsule by mouth daily.    Historical Provider, MD  omeprazole (PRILOSEC) 40 MG capsule Take 1 capsule (40 mg total) by mouth daily. 08/21/14   Janith Lima, MD  predniSONE (DELTASONE) 10 MG tablet Take 4 tablets with food x 2 days, 3 tablets x 2 days, 2 tablets x 2 days, 1 tablet x 2 days, then STOP 08/31/14   Deneise Lever, MD  primidone (MYSOLINE) 50 MG tablet TAKE THREE TABLETS BY MOUTH TWICE DAILY Patient taking differently: TAKE Two TABLETS BY MOUTH TWICE DAILY 07/30/14   Janith Lima, MD  RELION INSULIN SYR 1CC/30G 30G X 5/16" 1 ML MISC  05/03/14   Historical Provider, MD  sertraline (ZOLOFT) 100 MG tablet Take one and one half tablets daily Patient taking differently: Take 100 mg by mouth at bedtime.  02/07/14   Cloria Spring, MD  Tiotropium Bromide Monohydrate (SPIRIVA RESPIMAT) 2.5 MCG/ACT AERS Inhale 2 puffs into the lungs daily. 08/27/14   Deneise Lever, MD   tiZANidine (ZANAFLEX) 4 MG tablet Take 4 mg by mouth 3 (three) times daily.    Historical Provider, MD   BP 140/84 mmHg  Pulse 99  Temp(Src) 98.4 F (36.9 C) (Oral)  Resp 21  Ht 5' 4.5" (1.638 m)  Wt 190 lb (86.183 kg)  BMI 32.12 kg/m2  SpO2 96% Physical Exam CONSTITUTIONAL: Well developed/well nourished HEAD: Normocephalic/atraumatic EYES: EOMI/PERRL ENMT: Mucous membranes moist NECK: supple no meningeal signs SPINE/BACK:entire spine nontender CV: S1/S2 noted, no murmurs/rubs/gallops noted LUNGS: mild end expiratory wheeze noted, no apparent distress ABDOMEN: soft, nontender, no rebound or guarding, bowel sounds noted throughout abdomen GU:no cva tenderness NEURO: Pt is awake/alert/appropriate, moves all extremitiesx4.  No facial droop.   EXTREMITIES: pulses normal/equal, full ROM, no LE edema is noted SKIN: warm, color normal PSYCH: no abnormalities of mood noted, alert and oriented to situation  ED Course  Procedures   1:41 AM Will treat asthma and reassess Pt stable at this time 3:15 AM Pt feels improved She is in no distress No hypoxia She would like to be discharged Short course of prednisone ordered.  Will need to monitor glucose at home while on prednisone  Labs Review Labs Reviewed  BASIC METABOLIC PANEL - Abnormal; Notable for the following:    Sodium 134 (*)    Potassium 3.4 (*)    Chloride 96 (*)    Glucose, Bld 252 (*)    All other components within normal limits  CBC WITH DIFFERENTIAL/PLATELET - Abnormal; Notable for the following:    WBC 13.9 (*)    Neutro Abs 8.2 (*)    Lymphs Abs 4.5 (*)    All other components within normal limits    Imaging Review Dg Chest Port 1 View  09/10/2014   CLINICAL DATA:  Shortness of breath with nonproductive cough. History of seizures.  EXAM: PORTABLE CHEST -  1 VIEW  COMPARISON:  08/20/2014.  FINDINGS: The heart size and mediastinal contours are within normal limits. Both lungs are clear. The visualized skeletal  structures are unremarkable. Stable RIGHT hemidiaphragm elevation. BILATERAL deep brain stimulators appear unremarkable.  IMPRESSION: No active disease.  Stable chest.   Electronically Signed   By: Rolla Flatten M.D.   On: 09/10/2014 01:55     EKG Interpretation   Date/Time:  Monday September 10 2014 01:21:08 EDT Ventricular Rate:  100 PR Interval:  148 QRS Duration: 92 QT Interval:  355 QTC Calculation: 458 R Axis:   50 Text Interpretation:  Sinus tachycardia Borderline low voltage, extremity  leads Non-specific ST-t changes No significant change since last tracing  Confirmed by Christy Gentles  MD, Elenore Rota ([id number]) on 09/10/2014 1:38:23 AM     Medications  ipratropium-albuterol (DUONEB) 0.5-2.5 (3) MG/3ML nebulizer solution 3 mL (3 mLs Nebulization Given 09/10/14 0136)  albuterol (PROVENTIL) (2.5 MG/3ML) 0.083% nebulizer solution 2.5 mg (2.5 mg Nebulization Given 09/10/14 0136)  methylPREDNISolone sodium succinate (SOLU-MEDROL) 125 mg/2 mL injection 125 mg (125 mg Intravenous Given 09/10/14 0150)    MDM   Final diagnoses:  Asthma attack    Nursing notes including past medical history and social history reviewed and considered in documentation Previous records reviewed and considered xrays/imaging reviewed by myself and considered during evaluation Labs/vital reviewed myself and considered during evaluation     [name], MD 09/10/14 [phone]

## 2014-09-10 NOTE — ED Notes (Addendum)
Patient states that she has pain in her pelvis and back area when she lays flat or not turned. Patient repositioned and pillow placed behind back at this time.

## 2014-09-10 NOTE — Telephone Encounter (Signed)
lmtcb x1 for pt. 

## 2014-09-10 NOTE — Discharge Instructions (Signed)

## 2014-09-10 NOTE — ED Notes (Signed)
Pt brought in by [facility] for c/o sob; pt states she has been having increasing sob x 2 days;

## 2014-09-10 NOTE — Telephone Encounter (Signed)
Offer trial sample of Breo 200, 1 puff then rinse mouth well, once daily. Try this instead of Advair for comparison.  Does she have other ideas for what would help.

## 2014-09-13 ENCOUNTER — Telehealth: Payer: Self-pay | Admitting: Internal Medicine

## 2014-09-13 ENCOUNTER — Encounter: Payer: Self-pay | Admitting: Internal Medicine

## 2014-09-13 ENCOUNTER — Ambulatory Visit (INDEPENDENT_AMBULATORY_CARE_PROVIDER_SITE_OTHER): Payer: Medicare Other | Admitting: Internal Medicine

## 2014-09-13 VITALS — BP 106/74 | HR 89 | Ht 64.5 in | Wt 187.6 lb

## 2014-09-13 DIAGNOSIS — J4520 Mild intermittent asthma, uncomplicated: Secondary | ICD-10-CM | POA: Diagnosis not present

## 2014-09-13 DIAGNOSIS — IMO0001 Reserved for inherently not codable concepts without codable children: Secondary | ICD-10-CM

## 2014-09-13 MED ORDER — GUAIFENESIN-CODEINE 100-10 MG/5ML PO SYRP
5.0000 mL | ORAL_SOLUTION | Freq: Four times a day (QID) | ORAL | Status: DC | PRN
Start: 2014-09-13 — End: 2014-10-23

## 2014-09-13 MED ORDER — GUAIFENESIN 100 MG/5ML PO LIQD: 200.0000 mg | Freq: Three times a day (TID) | ORAL | Status: DC | PRN

## 2014-09-13 NOTE — Telephone Encounter (Signed)
Spoke with pt. She is under the impression you were going to call her in some cough syrup and not the Robitussin you sent to the pharmacy. She states she can buy that OTC. Please advise if you want her to do the Robitussin or if you meant to send something else.

## 2014-09-13 NOTE — Telephone Encounter (Signed)
Offer guaifenesin-codeine 100-10mg / 5 ml,  120 ml,  5 ml every 6 hours as needed for cough  Also suggest she try gargling with salt water   She can give herself a nebulizer treatment about an hour before she is due to sing

## 2014-09-13 NOTE — Telephone Encounter (Signed)
LMTCB x 1 

## 2014-09-13 NOTE — Patient Instructions (Addendum)
Script sent for cough syrup  Sample Breo Ellipta 200     1 puff then rinse mouth well, once daily  Please call as needed  Ok to use Allegra for sinus drainage  Keep the September appointment unless you need help sooner
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
## 2014-09-13 NOTE — Telephone Encounter (Signed)
Pt returning call.[name] ° °

## 2014-09-13 NOTE — Telephone Encounter (Signed)
Called made pt aware of recs. RX for cough syrup called into [facility].  Nothing further needed

## 2014-09-13 NOTE — Telephone Encounter (Signed)
LMTCB

## 2014-09-13 NOTE — Telephone Encounter (Signed)
Pt will call back

## 2014-09-17 ENCOUNTER — Telehealth: Payer: Self-pay | Admitting: Internal Medicine

## 2014-09-17 MED ORDER — AMOXICILLIN 500 MG PO CAPS: 500.0000 mg | ORAL_CAPSULE | Freq: Three times a day (TID) | ORAL | Status: DC

## 2014-09-17 NOTE — Telephone Encounter (Signed)
Suggest amoxacillin 500 mg, # 21, 1 three times daily

## 2014-09-17 NOTE — Telephone Encounter (Signed)
Spoke with pt. Reports increased wheezing and cough. Cough is producing yellow mucus. Denies chest tightness, SOB or fever. Onset was 2 weeks ago. Would like [name]'s recommendations.  Allergies  Allergen Reactions  . Losartan     Angioedema   . Metformin And Related Diarrhea  . Latex Other (See Comments)    Other Reaction: redness, blisters  . Morphine And Related Other (See Comments)    Pt. States med makes her crazy  . Other Other (See Comments)    Other Reaction: redness, blisters  . Oxycodone-Acetaminophen Itching    TYLOX. Caused internal itching per patient    Current Outpatient Prescriptions on File Prior to Visit  Medication Sig Dispense Refill  . albuterol (PROAIR HFA) 108 (90 BASE) MCG/ACT inhaler Inhale 2 puffs into the lungs every 6 (six) hours as needed for wheezing or shortness of breath. 8.5 g 5  . amLODipine (NORVASC) 10 MG tablet Take 10 mg by mouth daily.    Marland Kitchen aspirin EC 81 MG tablet Take 81 mg by mouth every morning.     . Azelastine-Fluticasone 137-50 MCG/ACT SUSP Place 1-2 puffs into the nose at bedtime. 6 g 0  . canagliflozin (INVOKANA) 300 MG TABS tablet Take 300 mg by mouth daily. 90 tablet 3  . DULoxetine (CYMBALTA) 60 MG capsule Take 1 capsule (60 mg total) by mouth daily. 30 capsule 11  . Fluticasone Furoate-Vilanterol (BREO ELLIPTA) 200-25 MCG/INH AEPB Inhale 1 puff into the lungs daily. (Patient not taking: Reported on 09/13/2014) 1 each 0  . fluticasone-salmeterol (ADVAIR HFA) 115-21 MCG/ACT inhaler Inhale 2 puffs into the lungs 2 (two) times daily. (Patient not taking: Reported on 09/13/2014) 1 Inhaler 0  . gabapentin (NEURONTIN) 300 MG capsule Take 1 tablet in the morning, 2 at night 270 capsule 1  . glucose blood test strip Use TID  E11.9 100 each 12  . guaiFENesin (ROBITUSSIN) 100 MG/5ML liquid Take 10 mLs (200 mg total) by mouth 3 (three) times daily as needed for cough. 120 mL 2  . guaiFENesin-codeine (ROBITUSSIN AC) 100-10 MG/5ML syrup Take 5 mLs by  mouth every 6 (six) hours as needed for cough. 120 mL 0  . HYDROcodone-acetaminophen (NORCO) 10-325 MG per tablet Take 1 tablet by mouth every 6 (six) hours as needed. 75 tablet 0  . Insulin Glargine (TOUJEO SOLOSTAR) 300 UNIT/ML SOPN Inject 50 Units into the skin daily. 1.5 mL 11  . Insulin Lispro, Human, (HUMALOG KWIKPEN) 200 UNIT/ML SOPN Inject 5 Units into the skin 3 (three) times daily with meals. 9 mL 3  . ketoconazole (NIZORAL) 2 % cream Apply 1 application topically daily. 60 g 2  . levothyroxine (SYNTHROID, LEVOTHROID) 75 MCG tablet TAKE ONE TABLET BY MOUTH ONCE DAILY BEFORE BREAKFAST 90 tablet 2  . metoprolol succinate (TOPROL-XL) 50 MG 24 hr tablet TAKE ONE TABLET BY MOUTH TWICE DAILY, TAKE WITH A MEAL OR IMMEDIATELY FOLLOWING A MEAL. 60 tablet 11  . Multiple Vitamins-Minerals (MULTIVITAMIN PO) Take 1 tablet by mouth daily.    . Omega-3 Fatty Acids (FISH OIL PO) Take 1 capsule by mouth daily.    Marland Kitchen omeprazole (PRILOSEC) 40 MG capsule Take 1 capsule (40 mg total) by mouth daily. 30 capsule 11  . predniSONE (DELTASONE) 50 MG tablet One tablet PO daily for 4 days (Patient not taking: Reported on 09/13/2014) 4 tablet 0  . primidone (MYSOLINE) 50 MG tablet TAKE THREE TABLETS BY MOUTH TWICE DAILY (Patient taking differently: TAKE Two TABLETS BY MOUTH TWICE DAILY) 180 tablet 3  .  RELION INSULIN SYR 1CC/30G 30G X 5/16" 1 ML MISC   0  . sertraline (ZOLOFT) 100 MG tablet Take one and one half tablets daily (Patient taking differently: Take 100 mg by mouth at bedtime. ) 45 tablet 2  . Tiotropium Bromide Monohydrate (SPIRIVA RESPIMAT) 2.5 MCG/ACT AERS Inhale 2 puffs into the lungs daily. 1 Inhaler 0  . tiZANidine (ZANAFLEX) 4 MG tablet Take 4 mg by mouth 3 (three) times daily.    Marland Kitchen triamterene-hydrochlorothiazide (DYAZIDE) 37.5-25 MG per capsule Take 1 capsule by mouth daily.  1   No current facility-administered medications on file prior to visit.    [name] - please advise. Thanks.

## 2014-09-17 NOTE — Telephone Encounter (Signed)
Pt aware of rec. RX sent in. Nothing further needed

## 2014-09-18 ENCOUNTER — Telehealth: Payer: Self-pay | Admitting: Internal Medicine

## 2014-09-18 MED ORDER — PREDNISONE 20 MG PO TABS: ORAL_TABLET | ORAL | Status: DC

## 2014-09-18 NOTE — Telephone Encounter (Signed)
Pt aware of recs. RX sent in. Nothing further needed 

## 2014-09-18 NOTE — Telephone Encounter (Signed)
Pt was seen 09/13/14 by Dr. Annamaria Boots.  She reports she is not any better. She reports she is still feeling SOB, slight prod cough (clear), wheezing, some nasal cong, PND.  She is reports she is taking the RX cough syrup robitussin TID and breo is not helping at all. She has not started allegra yet.  Wants an appt to see Dr. Annamaria Boots. Please advise thanks  Allergies  Allergen Reactions  . Losartan     Angioedema   . Metformin And Related Diarrhea  . Latex Other (See Comments)    Other Reaction: redness, blisters  . Morphine And Related Other (See Comments)    Pt. States med makes her crazy  . Other Other (See Comments)    Other Reaction: redness, blisters  . Oxycodone-Acetaminophen Itching    TYLOX. Caused internal itching per patient      Current Outpatient Prescriptions on File Prior to Visit  Medication Sig Dispense Refill  . albuterol (PROAIR HFA) 108 (90 BASE) MCG/ACT inhaler Inhale 2 puffs into the lungs every 6 (six) hours as needed for wheezing or shortness of breath. 8.5 g 5  . amLODipine (NORVASC) 10 MG tablet Take 10 mg by mouth daily.    Marland Kitchen amoxicillin (AMOXIL) 500 MG capsule Take 1 capsule (500 mg total) by mouth 3 (three) times daily. 21 capsule 0  . aspirin EC 81 MG tablet Take 81 mg by mouth every morning.     . Azelastine-Fluticasone 137-50 MCG/ACT SUSP Place 1-2 puffs into the nose at bedtime. 6 g 0  . canagliflozin (INVOKANA) 300 MG TABS tablet Take 300 mg by mouth daily. 90 tablet 3  . DULoxetine (CYMBALTA) 60 MG capsule Take 1 capsule (60 mg total) by mouth daily. 30 capsule 11  . Fluticasone Furoate-Vilanterol (BREO ELLIPTA) 200-25 MCG/INH AEPB Inhale 1 puff into the lungs daily. (Patient not taking: Reported on 09/13/2014) 1 each 0  . fluticasone-salmeterol (ADVAIR HFA) 115-21 MCG/ACT inhaler Inhale 2 puffs into the lungs 2 (two) times daily. (Patient not taking: Reported on 09/13/2014) 1 Inhaler 0  . gabapentin (NEURONTIN) 300 MG capsule Take 1 tablet in the morning, 2 at  night 270 capsule 1  . glucose blood test strip Use TID  E11.9 100 each 12  . guaiFENesin (ROBITUSSIN) 100 MG/5ML liquid Take 10 mLs (200 mg total) by mouth 3 (three) times daily as needed for cough. 120 mL 2  . guaiFENesin-codeine (ROBITUSSIN AC) 100-10 MG/5ML syrup Take 5 mLs by mouth every 6 (six) hours as needed for cough. 120 mL 0  . HYDROcodone-acetaminophen (NORCO) 10-325 MG per tablet Take 1 tablet by mouth every 6 (six) hours as needed. 75 tablet 0  . Insulin Glargine (TOUJEO SOLOSTAR) 300 UNIT/ML SOPN Inject 50 Units into the skin daily. 1.5 mL 11  . Insulin Lispro, Human, (HUMALOG KWIKPEN) 200 UNIT/ML SOPN Inject 5 Units into the skin 3 (three) times daily with meals. 9 mL 3  . ketoconazole (NIZORAL) 2 % cream Apply 1 application topically daily. 60 g 2  . levothyroxine (SYNTHROID, LEVOTHROID) 75 MCG tablet TAKE ONE TABLET BY MOUTH ONCE DAILY BEFORE BREAKFAST 90 tablet 2  . metoprolol succinate (TOPROL-XL) 50 MG 24 hr tablet TAKE ONE TABLET BY MOUTH TWICE DAILY, TAKE WITH A MEAL OR IMMEDIATELY FOLLOWING A MEAL. 60 tablet 11  . Multiple Vitamins-Minerals (MULTIVITAMIN PO) Take 1 tablet by mouth daily.    . Omega-3 Fatty Acids (FISH OIL PO) Take 1 capsule by mouth daily.    Marland Kitchen omeprazole (PRILOSEC) 40  MG capsule Take 1 capsule (40 mg total) by mouth daily. 30 capsule 11  . predniSONE (DELTASONE) 50 MG tablet One tablet PO daily for 4 days (Patient not taking: Reported on 09/13/2014) 4 tablet 0  . primidone (MYSOLINE) 50 MG tablet TAKE THREE TABLETS BY MOUTH TWICE DAILY (Patient taking differently: TAKE Two TABLETS BY MOUTH TWICE DAILY) 180 tablet 3  . RELION INSULIN SYR 1CC/30G 30G X 5/16" 1 ML MISC   0  . sertraline (ZOLOFT) 100 MG tablet Take one and one half tablets daily (Patient taking differently: Take 100 mg by mouth at bedtime. ) 45 tablet 2  . Tiotropium Bromide Monohydrate (SPIRIVA RESPIMAT) 2.5 MCG/ACT AERS Inhale 2 puffs into the lungs daily. 1 Inhaler 0  . tiZANidine (ZANAFLEX)  4 MG tablet Take 4 mg by mouth 3 (three) times daily.    Marland Kitchen triamterene-hydrochlorothiazide (DYAZIDE) 37.5-25 MG per capsule Take 1 capsule by mouth daily.  1   No current facility-administered medications on file prior to visit.

## 2014-09-18 NOTE — Telephone Encounter (Signed)
Im afraid we may not be able to do anything except to try prednisone. Would she be willing to try prednisone 20 mg daily x 6 days, then stop?

## 2014-09-21 ENCOUNTER — Telehealth: Payer: Self-pay | Admitting: Critical Care Medicine

## 2014-09-21 NOTE — Telephone Encounter (Signed)
Received a call from Ms. Bye reporting that since she started taking the prednisone for her asthma flare her blood sugar is now 347.  She was prescribed prednisone 20 mg po daily for 6 days.  She is on Lispor with meals along with glargine daily prescribed by Dr. Ronnald Ramp.  Patient by phone evaluation is in NAD, talking in complete sentences and is alert and oriented.    Plan:  I have recommended she contact Dr. Ronnald Ramp for further instructions related to her hyperglycemia.    She is in agreement with this and stated she will contact Dr. Ronnald Ramp.

## 2014-09-27 ENCOUNTER — Telehealth: Payer: Self-pay | Admitting: Internal Medicine

## 2014-09-27 NOTE — Telephone Encounter (Signed)
Ok to give samples if available

## 2014-09-27 NOTE — Telephone Encounter (Signed)
Pt requesting samples of breo. Please advise Dr. Annamaria Boots thanks

## 2014-09-28 ENCOUNTER — Emergency Department (HOSPITAL_COMMUNITY): Payer: Medicare Other

## 2014-09-28 ENCOUNTER — Encounter (HOSPITAL_COMMUNITY): Payer: Self-pay | Admitting: Emergency Medicine

## 2014-09-28 ENCOUNTER — Emergency Department (HOSPITAL_COMMUNITY)
Admission: EM | Admit: 2014-09-28 | Discharge: 2014-09-28 | Disposition: A | Discharge status: Home / Self Care | Payer: Medicare Other | Attending: Emergency Medicine | Admitting: Emergency Medicine

## 2014-09-28 DIAGNOSIS — F329 Major depressive disorder, single episode, unspecified: Secondary | ICD-10-CM | POA: Insufficient documentation

## 2014-09-28 DIAGNOSIS — E039 Hypothyroidism, unspecified: Secondary | ICD-10-CM | POA: Insufficient documentation

## 2014-09-28 DIAGNOSIS — S0990XA Unspecified injury of head, initial encounter: Secondary | ICD-10-CM | POA: Diagnosis not present

## 2014-09-28 DIAGNOSIS — Z792 Long term (current) use of antibiotics: Secondary | ICD-10-CM | POA: Diagnosis not present

## 2014-09-28 DIAGNOSIS — Y92010 Kitchen of single-family (private) house as the place of occurrence of the external cause: Secondary | ICD-10-CM | POA: Diagnosis not present

## 2014-09-28 DIAGNOSIS — E119 Type 2 diabetes mellitus without complications: Secondary | ICD-10-CM | POA: Insufficient documentation

## 2014-09-28 DIAGNOSIS — Z7982 Long term (current) use of aspirin: Secondary | ICD-10-CM | POA: Diagnosis not present

## 2014-09-28 DIAGNOSIS — F419 Anxiety disorder, unspecified: Secondary | ICD-10-CM | POA: Diagnosis not present

## 2014-09-28 DIAGNOSIS — Z79899 Other long term (current) drug therapy: Secondary | ICD-10-CM | POA: Diagnosis not present

## 2014-09-28 DIAGNOSIS — S6991XA Unspecified injury of right wrist, hand and finger(s), initial encounter: Secondary | ICD-10-CM | POA: Diagnosis not present

## 2014-09-28 DIAGNOSIS — G40909 Epilepsy, unspecified, not intractable, without status epilepticus: Secondary | ICD-10-CM | POA: Diagnosis not present

## 2014-09-28 DIAGNOSIS — Y9389 Activity, other specified: Secondary | ICD-10-CM | POA: Insufficient documentation

## 2014-09-28 DIAGNOSIS — J45909 Unspecified asthma, uncomplicated: Secondary | ICD-10-CM | POA: Insufficient documentation

## 2014-09-28 DIAGNOSIS — W19XXXA Unspecified fall, initial encounter: Secondary | ICD-10-CM

## 2014-09-28 DIAGNOSIS — S4992XA Unspecified injury of left shoulder and upper arm, initial encounter: Secondary | ICD-10-CM | POA: Diagnosis not present

## 2014-09-28 DIAGNOSIS — Z7952 Long term (current) use of systemic steroids: Secondary | ICD-10-CM | POA: Insufficient documentation

## 2014-09-28 DIAGNOSIS — Z91040 Latex allergy status: Secondary | ICD-10-CM | POA: Insufficient documentation

## 2014-09-28 DIAGNOSIS — Z794 Long term (current) use of insulin: Secondary | ICD-10-CM | POA: Diagnosis not present

## 2014-09-28 DIAGNOSIS — Y998 Other external cause status: Secondary | ICD-10-CM | POA: Insufficient documentation

## 2014-09-28 DIAGNOSIS — Z87891 Personal history of nicotine dependence: Secondary | ICD-10-CM | POA: Diagnosis not present

## 2014-09-28 DIAGNOSIS — I10 Essential (primary) hypertension: Secondary | ICD-10-CM | POA: Insufficient documentation

## 2014-09-28 DIAGNOSIS — S60221A Contusion of right hand, initial encounter: Secondary | ICD-10-CM | POA: Insufficient documentation

## 2014-09-28 DIAGNOSIS — G8929 Other chronic pain: Secondary | ICD-10-CM | POA: Diagnosis not present

## 2014-09-28 DIAGNOSIS — K219 Gastro-esophageal reflux disease without esophagitis: Secondary | ICD-10-CM | POA: Diagnosis not present

## 2014-09-28 DIAGNOSIS — S199XXA Unspecified injury of neck, initial encounter: Secondary | ICD-10-CM | POA: Diagnosis not present

## 2014-09-28 DIAGNOSIS — W01198A Fall on same level from slipping, tripping and stumbling with subsequent striking against other object, initial encounter: Secondary | ICD-10-CM | POA: Insufficient documentation

## 2014-09-28 MED ORDER — FLUTICASONE FUROATE-VILANTEROL 200-25 MCG/INH IN AEPB: 1.0000 | INHALATION_SPRAY | Freq: Every day | RESPIRATORY_TRACT | Status: DC

## 2014-09-28 NOTE — ED Provider Notes (Addendum)
CSN: [id number]     Arrival date & time 09/28/14  1947 History   This chart was scribed for [name], MD by Randa Evens, ED Scribe. This patient was seen in room APA05/AP[facility]05 and the patient's care was started at 8:05 PM.     Chief Complaint  Patient presents with  . Fall  . Hand Pain   Patient is a 64 y.o. female presenting with fall. The history is provided by the patient. No language interpreter was used.  Fall This is a new problem. The current episode started 1 to 2 hours ago. The problem occurs rarely. The problem has not changed since onset.Nothing aggravates the symptoms. Nothing relieves the symptoms. She has tried nothing for the symptoms. The treatment provided mild relief.   HPI Comments: [name] is a 64 y.o. female who presents to the Emergency Department complaining of fall onset tonight at 5:30 PM. Pt is complaining of right thumb pain, left upper arm pain, and some slight posterior neck pain.  Pt states she did hit her posterior head on her kitchen cabinets when falling. Pt states she tried to catch her fall with her out stretched arms. Pt denies HA or LOC.    Past Medical History  Diagnosis Date  . 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 [name], residual palpitations  . Vocal cord dysfunction   . Tremor, essential     takes Mysoline and Neurontin daily.  . Allergic rhinitis     uses Nasonex daily as needed  . Low sodium syndrome   . Chronic back pain     reason unknown  . Lumbar radiculopathy   . DDD (degenerative disc disease), lumbar   . Esophageal reflux     takes Zantac daily  . Hypertension     takes Losartan daily  . Anxiety     takes Ativan daily as needed  . Hypothyroidism     takes Synthroid daily  . Depression     takes Zoloft daily as well as Cymbalta  . Diabetes mellitus     takes NOvolin daily  . Asthma     Albuterol inhaler and Neb daily as needed  . History of bronchitis Dec 2014  .  Seizures 04-05-13    one time ran out of Primidone and had stopped it cold [place]  . Weakness     in left arm  . Joint pain   . Joint swelling     left ankle  . Dysphagia   . Constipation   . Allergy to angiotensin receptor blockers (ARB) 01/22/2014    Angioedema  . 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 Rayann Heman 2007   Past Surgical History  Procedure Laterality Date  . Tonsillectomy    . Right breast cyst      benign  . Left ankle ligament repair      x 2  . Left elbow repair    . Partial hysterectomy    . Cholecystectomy    . Cesarean section    . Ablation for avnrt    . Colonoscopy  01/16/2004    LEX:NTZGYF rectum/colon  . Esophagogastroduodenoscopy (egd) with esophageal dilation  04/03/2002    VCB:SWHQPRFF'M ring, otherwise normal esophagus, status post dilation with 54 F/Normal stomach  . Bartholin gland cyst excision      x 2  . Esophagogastroduodenoscopy (egd) with esophageal dilation N/A 11/08/2012    Procedure: ESOPHAGOGASTRODUODENOSCOPY (EGD) WITH ESOPHAGEAL  DILATION;  Surgeon: Daneil Dolin, MD;  Location: [facility];  Service: Endoscopy;  Laterality: N/A;  11:30  . Subthalamic stimulator insertion Bilateral 12/04/2013    Procedure: Bilateral Deep brain stimulator placement;  Surgeon: [name], MD;  Location: [place] [facility];  Service: Neurosurgery;  Laterality: Bilateral;  Bilateral Deep brain stimulator placement  . Pulse generator implant Bilateral 12/15/2013    Procedure: BILATERAL PULSE GENERATOR IMPLANT;  Surgeon: [name], MD;  Location: [place] [facility];  Service: Neurosurgery;  Laterality: Bilateral;  BILATERAL  . Maximum access (mas)posterior lumbar interbody fusion (plif) 1 level N/A 04/24/2014    Procedure: Lumbar four-five Maximum access Surgery posterior lumbar interbody fusion;  Surgeon: [name], MD;  Location: [place] [facility];  Service: Neurosurgery;  Laterality: N/A;  Lumbar four-five Maximum access Surgery posterior lumbar interbody fusion    Family History  Problem Relation Age of Onset  . Lung cancer Father     DIED AGE 72 LUNG CA  . Alcohol abuse Father   . Anxiety disorder Father   . Depression Father   . COPD Mother     DIED AGE 56,MRSA,COPD,PNEUMONIA  . Pneumonia Mother     DIED AGE 56,MRSA,COPD,PNEUMONIA  . Supraventricular tachycardia Mother   . Anxiety disorder Mother   . Depression Mother   . Alcohol abuse Mother   . COPD Brother     AGE 1  . Heart disease Sister     DIED AGE 28, SMOKER,?HEART  . Colon cancer Neg Hx    History  Substance Use Topics  . Smoking status: Former Smoker -- 0.30 packs/day for 10 years    Types: Cigarettes    Quit date: 04/23/1993  . Smokeless tobacco: Never Used     Comment: quit smoking 25+yrs ago  . Alcohol Use: No     Comment: no alcohol in 62yrs   OB History    No data available     Review of Systems  Constitutional: Negative for appetite change and fatigue.  HENT: Negative for congestion, ear discharge and sinus pressure.   Eyes: Negative for discharge.  Respiratory: Negative for cough.   Gastrointestinal: Negative for diarrhea.  Genitourinary: Negative for frequency and hematuria.  Musculoskeletal: Positive for arthralgias and neck pain. Negative for back pain.  Skin: Negative for rash.  Neurological: Negative for seizures.  Psychiatric/Behavioral: Negative for hallucinations.      Allergies  Losartan; Metformin and related; Latex; Morphine and related; Other; and Oxycodone-acetaminophen  Home Medications   Prior to Admission medications   Medication Sig Start Date End Date Taking? Authorizing Provider  albuterol (PROAIR HFA) 108 (90 BASE) MCG/ACT inhaler Inhale 2 puffs into the lungs every 6 (six) hours as needed for wheezing or shortness of breath. 08/03/13   Tammy S Parrett, NP  amLODipine (NORVASC) 10 MG tablet Take 10 mg by mouth daily.    Historical Provider, MD  amoxicillin (AMOXIL) 500 MG capsule Take 1 capsule (500 mg total) by mouth 3  (three) times daily. 09/17/14   Deneise Lever, MD  aspirin EC 81 MG tablet Take 81 mg by mouth every morning.     Historical Provider, MD  Azelastine-Fluticasone 137-50 MCG/ACT SUSP Place 1-2 puffs into the nose at bedtime. 08/27/14   Deneise Lever, MD  canagliflozin (INVOKANA) 300 MG TABS tablet Take 300 mg by mouth daily. 07/02/14   Janith Lima, MD  DULoxetine (CYMBALTA) 60 MG capsule Take 1 capsule (60 mg total) by mouth daily. 05/30/14 05/30/15  Marcello Moores  Evalina Field, MD  Fluticasone Furoate-Vilanterol (BREO ELLIPTA) 200-25 MCG/INH AEPB Inhale 1 puff into the lungs daily. 09/28/14   Deneise Lever, MD  fluticasone-salmeterol (ADVAIR HFA) 160-10 MCG/ACT inhaler Inhale 2 puffs into the lungs 2 (two) times daily. Patient not taking: Reported on 09/13/2014 07/02/14   Deneise Lever, MD  gabapentin (NEURONTIN) 300 MG capsule Take 1 tablet in the morning, 2 at night 08/29/14   Eustace Quail Tat, DO  glucose blood test strip Use TID  E11.9 04/10/14   Biagio Borg, MD  guaiFENesin (ROBITUSSIN) 100 MG/5ML liquid Take 10 mLs (200 mg total) by mouth 3 (three) times daily as needed for cough. 09/13/14   Deneise Lever, MD  guaiFENesin-codeine (ROBITUSSIN AC) 100-10 MG/5ML syrup Take 5 mLs by mouth every 6 (six) hours as needed for cough. 09/13/14   Deneise Lever, MD  HYDROcodone-acetaminophen (NORCO) 10-325 MG per tablet Take 1 tablet by mouth every 6 (six) hours as needed. 08/21/14   Janith Lima, MD  Insulin Glargine (TOUJEO SOLOSTAR) 300 UNIT/ML SOPN Inject 50 Units into the skin daily. 07/02/14   Janith Lima, MD  Insulin Lispro, Human, (HUMALOG KWIKPEN) 200 UNIT/ML SOPN Inject 5 Units into the skin 3 (three) times daily with meals. 07/02/14   Janith Lima, MD  ketoconazole (NIZORAL) 2 % cream Apply 1 application topically daily. 05/30/14   Janith Lima, MD  levothyroxine (SYNTHROID, LEVOTHROID) 75 MCG tablet TAKE ONE TABLET BY MOUTH ONCE DAILY BEFORE BREAKFAST 08/29/14   Janith Lima, MD  metoprolol succinate  (TOPROL-XL) 50 MG 24 hr tablet TAKE ONE TABLET BY MOUTH TWICE DAILY, TAKE WITH A MEAL OR IMMEDIATELY FOLLOWING A MEAL. 05/20/14   Janith Lima, MD  Multiple Vitamins-Minerals (MULTIVITAMIN PO) Take 1 tablet by mouth daily.    Historical Provider, MD  Omega-3 Fatty Acids (FISH OIL PO) Take 1 capsule by mouth daily.    Historical Provider, MD  omeprazole (PRILOSEC) 40 MG capsule Take 1 capsule (40 mg total) by mouth daily. 08/21/14   Janith Lima, MD  predniSONE (DELTASONE) 20 MG tablet Take 1 tablet once daily x 6 days 09/18/14   Deneise Lever, MD  primidone (MYSOLINE) 50 MG tablet TAKE THREE TABLETS BY MOUTH TWICE DAILY Patient taking differently: TAKE Two TABLETS BY MOUTH TWICE DAILY 07/30/14   Janith Lima, MD  RELION INSULIN SYR 1CC/30G 30G X 5/16" 1 ML MISC  05/03/14   Historical Provider, MD  sertraline (ZOLOFT) 100 MG tablet Take one and one half tablets daily Patient taking differently: Take 100 mg by mouth at bedtime.  02/07/14   Cloria Spring, MD  Tiotropium Bromide Monohydrate (SPIRIVA RESPIMAT) 2.5 MCG/ACT AERS Inhale 2 puffs into the lungs daily. 08/27/14   Deneise Lever, MD  tiZANidine (ZANAFLEX) 4 MG tablet Take 4 mg by mouth 3 (three) times daily.    Historical Provider, MD  triamterene-hydrochlorothiazide (DYAZIDE) 37.5-25 MG per capsule Take 1 capsule by mouth daily. 08/30/14   Historical Provider, MD   BP 110/76 mmHg  Pulse 106  Temp(Src) 99.1 F (37.3 C) (Oral)  Resp 18  Ht 5' 4.5" (1.638 m)  Wt 200 lb (90.719 kg)  BMI 33.81 kg/m2  SpO2 97%   Physical Exam  Constitutional: She is oriented to person, place, and time. She appears well-developed.  HENT:  Head: Normocephalic.  Eyes: Conjunctivae and EOM are normal. No scleral icterus.  Neck: Neck supple. No tracheal deviation present. No thyromegaly present.  Cardiovascular: Normal rate and regular rhythm.  Exam reveals no gallop and no friction rub.   No murmur heard. Pulmonary/Chest: No stridor. She has no wheezes.  She has no rales. She exhibits no tenderness.  Abdominal: She exhibits no distension. There is no tenderness. There is no rebound.  Musculoskeletal: Normal range of motion. She exhibits tenderness. She exhibits no edema.  Right thenar eminence of right hand tender. Mild tenderness to posterior neck.    Lymphadenopathy:    She has no cervical adenopathy.  Neurological: She is oriented to person, place, and time. She exhibits normal muscle tone. Coordination normal.  Skin: Skin is warm. No rash noted. No erythema.  Psychiatric: She has a normal mood and affect. Her behavior is normal.    ED Course  Procedures (including critical care time) DIAGNOSTIC STUDIES: Oxygen Saturation is 97% on RA, normal by my interpretation.    COORDINATION OF CARE: 8:14 PM-Discussed treatment plan with pt at bedside and pt agreed to plan.     Labs Review Labs Reviewed - No data to display  Imaging Review No results found.   EKG Interpretation None      MDM   Final diagnoses:  None      Fall,  Hand contusion  The chart was scribed for me under my direct supervision.  I personally performed the history, physical, and medical decision making and all procedures in the evaluation of this patient.[name], MD 09/28/14 [id number]  [name], MD 09/28/14 [id number]

## 2014-09-28 NOTE — ED Notes (Signed)
Pt alert & oriented x4, stable gait. Patient  given discharge instructions, paperwork & prescription(s).  Patient verbalized understanding. Pt left department in wheelchair w/ no further questions. 

## 2014-09-28 NOTE — ED Notes (Signed)
   09/28/14 2012  Musculoskeletal  Musculoskeletal (WDL) X  RUE Full movement  LUE Full movement  Pt states she fell about 1730. Says she hit the back of her head on cabinets. Neck is sore. Right thumb hurts "a lot" & left elbow pain. Pulses present & pt moving all ext.

## 2014-09-28 NOTE — Telephone Encounter (Signed)
Samples of Breo 200 placed up front Nothing further needed.

## 2014-09-28 NOTE — ED Notes (Signed)
Patient reports fell tonight at approximately 1730. States she hit her head. Also complaining of right hand, right wrist pain, and left forearm pain. Denies loss of consciousness.

## 2014-09-28 NOTE — Discharge Instructions (Signed)
Follow up with your md as needed °

## 2014-09-28 NOTE — Telephone Encounter (Signed)
Pt calling again about samples, please advise.[name]

## 2014-10-02 ENCOUNTER — Ambulatory Visit: Payer: Medicare Other | Admitting: Internal Medicine

## 2014-10-08 ENCOUNTER — Other Ambulatory Visit: Payer: Self-pay | Admitting: Endocrinology

## 2014-10-08 NOTE — Assessment & Plan Note (Signed)
There is a significant component of anxiety and vocal cord dysfunction admixed with her asthma. Not sure if she will find Breo Ellipta any more comfortable for her larynx than Advair. Plan-teach Breo use. Cough syrup as requested.

## 2014-10-11 ENCOUNTER — Encounter: Payer: Self-pay | Admitting: Adult Health

## 2014-10-11 ENCOUNTER — Ambulatory Visit (INDEPENDENT_AMBULATORY_CARE_PROVIDER_SITE_OTHER): Payer: Commercial Managed Care - HMO | Admitting: Adult Health

## 2014-10-11 VITALS — BP 110/68 | HR 88 | Ht 64.5 in | Wt 180.0 lb

## 2014-10-11 DIAGNOSIS — R600 Localized edema: Secondary | ICD-10-CM

## 2014-10-11 DIAGNOSIS — I471 Supraventricular tachycardia: Secondary | ICD-10-CM

## 2014-10-11 NOTE — Progress Notes (Deleted)
Name: [name]    DOB: 1950/08/21  Age: 64 y.o.  MR#: [id number]       PCP:  Scarlette Calico, MD      Insurance: Payor: [employer] MEDICARE / Plan: [place] HMO / Product Type: *No Product type* /   CC:    Chief Complaint  Patient presents with  . Tachycardia    AV Nodal reentrant tachycardia  . Hypertension    VS Filed Vitals:   10/11/14 1524  BP: 110/68  Pulse: 88  Height: 5' 4.5" (1.638 m)  Weight: 180 lb (81.647 kg)  SpO2: 96%    Weights Current Weight  10/11/14 180 lb (81.647 kg)  09/28/14 200 lb (90.719 kg)  09/13/14 187 lb 9.6 oz (85.095 kg)    Blood Pressure  BP Readings from Last 3 Encounters:  10/11/14 110/68  09/28/14 113/68  09/13/14 106/74     Admit date:  (Not on file) Last encounter with RMR:  Visit date not found   Allergy Losartan; Metformin and related; Latex; Morphine and related; Other; and Oxycodone-acetaminophen  Current Outpatient Prescriptions  Medication Sig Dispense Refill  . albuterol (PROAIR HFA) 108 (90 BASE) MCG/ACT inhaler Inhale 2 puffs into the lungs every 6 (six) hours as needed for wheezing or shortness of breath. 8.5 g 5  . amLODipine (NORVASC) 10 MG tablet Take 10 mg by mouth daily.    Marland Kitchen amoxicillin (AMOXIL) 500 MG capsule Take 1 capsule (500 mg total) by mouth 3 (three) times daily. 21 capsule 0  . aspirin EC 81 MG tablet Take 81 mg by mouth every morning.     . Azelastine-Fluticasone 137-50 MCG/ACT SUSP Place 1-2 puffs into the nose at bedtime. 6 g 0  . canagliflozin (INVOKANA) 300 MG TABS tablet Take 300 mg by mouth daily. 90 tablet 3  . DULoxetine (CYMBALTA) 60 MG capsule Take 1 capsule (60 mg total) by mouth daily. 30 capsule 11  . Fluticasone Furoate-Vilanterol (BREO ELLIPTA) 200-25 MCG/INH AEPB Inhale 1 puff into the lungs daily. 2 each 0  . fluticasone-salmeterol (ADVAIR HFA) 115-21 MCG/ACT inhaler Inhale 2 puffs into the lungs 2 (two) times daily. 1 Inhaler 0  . gabapentin (NEURONTIN) 300 MG capsule Take 1 tablet in  the morning, 2 at night 270 capsule 1  . glucose blood test strip Use TID  E11.9 100 each 12  . guaiFENesin (ROBITUSSIN) 100 MG/5ML liquid Take 10 mLs (200 mg total) by mouth 3 (three) times daily as needed for cough. 120 mL 2  . guaiFENesin-codeine (ROBITUSSIN AC) 100-10 MG/5ML syrup Take 5 mLs by mouth every 6 (six) hours as needed for cough. 120 mL 0  . HYDROcodone-acetaminophen (NORCO) 10-325 MG per tablet Take 1 tablet by mouth every 6 (six) hours as needed. 75 tablet 0  . Insulin Glargine (TOUJEO SOLOSTAR) 300 UNIT/ML SOPN Inject 50 Units into the skin daily. 1.5 mL 11  . Insulin Lispro, Human, (HUMALOG KWIKPEN) 200 UNIT/ML SOPN Inject 5 Units into the skin 3 (three) times daily with meals. 9 mL 3  . ketoconazole (NIZORAL) 2 % cream Apply 1 application topically daily. 60 g 2  . levothyroxine (SYNTHROID, LEVOTHROID) 75 MCG tablet TAKE ONE TABLET BY MOUTH ONCE DAILY BEFORE BREAKFAST 90 tablet 2  . metoprolol succinate (TOPROL-XL) 50 MG 24 hr tablet TAKE ONE TABLET BY MOUTH TWICE DAILY, TAKE WITH A MEAL OR IMMEDIATELY FOLLOWING A MEAL. 60 tablet 11  . Multiple Vitamins-Minerals (MULTIVITAMIN PO) Take 1 tablet by mouth daily.    . Omega-3  Fatty Acids (FISH OIL PO) Take 1 capsule by mouth daily.    Marland Kitchen omeprazole (PRILOSEC) 40 MG capsule Take 1 capsule (40 mg total) by mouth daily. 30 capsule 11  . predniSONE (DELTASONE) 20 MG tablet Take 1 tablet once daily x 6 days 6 tablet 0  . primidone (MYSOLINE) 50 MG tablet TAKE THREE TABLETS BY MOUTH TWICE DAILY (Patient taking differently: TAKE Two TABLETS BY MOUTH TWICE DAILY) 180 tablet 3  . RELION INSULIN SYR 1CC/30G 30G X 5/16" 1 ML MISC   0  . sertraline (ZOLOFT) 100 MG tablet Take one and one half tablets daily (Patient taking differently: Take 100 mg by mouth at bedtime. ) 45 tablet 2  . Tiotropium Bromide Monohydrate (SPIRIVA RESPIMAT) 2.5 MCG/ACT AERS Inhale 2 puffs into the lungs daily. 1 Inhaler 0  . tiZANidine (ZANAFLEX) 4 MG tablet Take 4 mg  by mouth 3 (three) times daily.    Marland Kitchen triamterene-hydrochlorothiazide (DYAZIDE) 37.5-25 MG per capsule Take 1 capsule by mouth daily.  1  . ULTICARE MICRO PEN NEEDLES 32G X 4 MM MISC USE TWICE DAILY 60 each 0   No current facility-administered medications for this visit.    Discontinued Meds:   There are no discontinued medications.  Patient Active Problem List   Diagnosis Date Noted  . Visit for screening mammogram 08/21/2014  . Hypokalemia 05/30/2014  . Tinea corporis 05/30/2014  . Lumbar scoliosis 04/24/2014  . Sleep apnea 04/23/2014  . Allergy to angiotensin receptor blockers (ARB) 01/22/2014  . S/P deep brain stimulator placement 01/10/2014  . Essential tremor 12/04/2013  . Greater trochanteric bursitis of left hip 11/07/2013  . Spinal stenosis of lumbar region 10/30/2013  . Right thyroid nodule 10/12/2013  . CMC arthritis, thumb, degenerative 06/15/2013  . IBS (irritable bowel syndrome) 11/03/2012  . Esophageal stenosis 11/01/2012  . Moderate intermittent asthma without complication 93/26/7124  . Hypothyroidism 12/22/2011  . Tachyarrhythmia 06/24/2009  . Hyperlipidemia with target LDL less than 100 06/22/2008  . Essential hypertension 09/04/2007  . TREMOR, ESSENTIAL 04/19/2007  . Seasonal and perennial allergic rhinitis 04/19/2007  . Esophageal reflux 04/19/2007    LABS    Component Value Date/Time   NA 134* 09/10/2014 0153   NA 131* 08/19/2014 2327   NA 135 05/30/2014 1441   K 3.4* 09/10/2014 0153   K 4.2 08/19/2014 2327   K 3.9 05/30/2014 1441   CL 96* 09/10/2014 0153   CL 92* 08/19/2014 2327   CL 99 05/30/2014 1441   CO2 25 09/10/2014 0153   CO2 29 08/19/2014 2327   CO2 28 05/30/2014 1441   GLUCOSE 252* 09/10/2014 0153   GLUCOSE 309* 08/19/2014 2327   GLUCOSE 279* 05/30/2014 1441   BUN 19 09/10/2014 0153   BUN 17 08/19/2014 2327   BUN 8 05/30/2014 1441   CREATININE 0.71 09/10/2014 0153   CREATININE 0.77 08/19/2014 2327   CREATININE 0.83 05/30/2014  1441   CREATININE 0.70 12/27/2012 1006   CALCIUM 9.2 09/10/2014 0153   CALCIUM 9.3 08/19/2014 2327   CALCIUM 9.0 05/30/2014 1441   GFRNONAA >60 09/10/2014 0153   GFRNONAA >60 08/19/2014 2327   GFRNONAA 75* 05/12/2014 1048   GFRAA >60 09/10/2014 0153   GFRAA >60 08/19/2014 2327   GFRAA 86* 05/12/2014 1048   CMP     Component Value Date/Time   NA 134* 09/10/2014 0153   K 3.4* 09/10/2014 0153   CL 96* 09/10/2014 0153   CO2 25 09/10/2014 0153   GLUCOSE 252* 09/10/2014 0153  BUN 19 09/10/2014 0153   CREATININE 0.71 09/10/2014 0153   CREATININE 0.70 12/27/2012 1006   CALCIUM 9.2 09/10/2014 0153   PROT 7.2 08/19/2014 2327   ALBUMIN 4.2 08/19/2014 2327   AST 21 08/19/2014 2327   ALT 33 08/19/2014 2327   ALKPHOS 80 08/19/2014 2327   BILITOT 0.5 08/19/2014 2327   GFRNONAA >60 09/10/2014 0153   GFRAA >60 09/10/2014 0153       Component Value Date/Time   WBC 13.9* 09/10/2014 0153   WBC 9.1 08/19/2014 2327   WBC 7.5 04/23/2014 1131   HGB 15.0 09/10/2014 0153   HGB 14.4 08/19/2014 2327   HGB 14.1 04/23/2014 1131   HCT 43.2 09/10/2014 0153   HCT 41.5 08/19/2014 2327   HCT 40.6 04/23/2014 1131   MCV 90.8 09/10/2014 0153   MCV 90.0 08/19/2014 2327   MCV 87.7 04/23/2014 1131    Lipid Panel     Component Value Date/Time   CHOL 198 02/12/2014 0935   TRIG 180.0* 02/12/2014 0935   HDL 68.20 02/12/2014 0935   CHOLHDL 3 02/12/2014 0935   VLDL 36.0 02/12/2014 0935   LDLCALC 94 02/12/2014 0935    ABG    Component Value Date/Time   PHART 7.300* 04/05/2013 2235   PCO2ART 30.0* 04/05/2013 2235   PO2ART 87.0 04/05/2013 2235   HCO3 14.3* 04/05/2013 2235   TCO2 13.0 04/05/2013 2235   ACIDBASEDEF 10.8* 04/05/2013 2235   O2SAT 96.1 04/05/2013 2235     Lab Results  Component Value Date   TSH 0.80 05/30/2014   BNP (last 3 results) No results for input(s): BNP in the last 8760 hours.  ProBNP (last 3 results) No results for input(s): PROBNP in the last 8760  hours.  Cardiac Panel (last 3 results) No results for input(s): CKTOTAL, CKMB, TROPONINI, RELINDX in the last 72 hours.  Iron/TIBC/Ferritin/ %Sat    Component Value Date/Time   IRON 34* 01/18/2013 1451   FERRITIN 18.7 01/18/2013 1451   IRONPCTSAT 10.1* 01/18/2013 1451     EKG Orders placed or performed during the hospital encounter of 09/10/14  . ED EKG  . ED EKG  . EKG     Prior Assessment and Plan Problem List as of 10/11/2014    Hyperlipidemia with target LDL less than 100   Last Assessment & Plan 01/18/2013 Office Visit Written 01/24/2013  5:30 PM by Janith Lima, MD    Her LDL goal has been met      [place] 06/14/2013 Office Visit Written 06/15/2013  1:09 PM by Janith Lima, MD    She wants to see [name] about this      Essential hypertension   Last Assessment & Plan 07/02/2014 Office Visit Written 07/02/2014  4:34 PM by Janith Lima, MD    Her BP is well controlled Lytes and renal function are stable      Tachyarrhythmia   Last Assessment & Plan 12/23/2012 Office Visit Written 12/23/2012  2:27 PM by Lendon Colonel, NP    No complaints about her heart racing or palpitations. Will continue BB as directed.       Seasonal and perennial allergic rhinitis   Last Assessment & Plan 08/27/2014 Office Visit Written 08/28/2014  9:49 PM by Deneise Lever, MD    Heart to tell if she's had a recent allergic or viral upper respiratory illness causing hoarseness and some increase in her asthma with yellow sputum Plan-antihistamines and Flonase  nasal spray.      Esophageal reflux   Last Assessment & Plan 10/10/2013 Office Visit Written 10/10/2013  9:35 PM by Deneise Lever, MD    Reminded of reflux precautions      Hypothyroidism   Last Assessment & Plan 07/02/2014 Office Visit Written 07/02/2014  4:33 PM by Janith Lima, MD    Her recent TSh was in the normal range Will cont the current dose of synthroid      Moderate intermittent  asthma without complication   Last Assessment & Plan 09/13/2014 Office Visit Written 10/08/2014  4:44 PM by Deneise Lever, MD    There is a significant component of anxiety and vocal cord dysfunction admixed with her asthma. Not sure if she will find Breo Ellipta any more comfortable for her larynx than Advair. Plan-teach Breo use. Cough syrup as requested.      Esophageal stenosis   IBS (irritable bowel syndrome)   CMC arthritis, thumb, degenerative   Last Assessment & Plan 02/12/2014 Office Visit Written 02/13/2014  4:49 PM by Janith Lima, MD    She will continue norco as needed for pain      Right thyroid nodule   Spinal stenosis of lumbar region   Last Assessment & Plan 11/20/2013 Office Visit Written 11/21/2013  6:55 AM by Janith Lima, MD    She will cont norco as needed for pain She wants to see if ESI will help so I have referred her to pain management      Greater trochanteric bursitis of left hip   Last Assessment & Plan 11/07/2013 Office Visit Written 11/07/2013  2:43 PM by Lyndal Pulley, DO    Patient did respond very well to injection today. I do think that there is likely radicular symptoms from her back that could also be contributing to the discomfort. Patient will do some icing and topical anti-inflammatories. Patient and will try the home exercises that was given to her. Patient will followup again but has other more pressing medical issues in the near future.      Essential tremor   S/P deep brain stimulator placement   Allergy to angiotensin receptor blockers (ARB)   Sleep apnea   Lumbar scoliosis   Hypokalemia   Last Assessment & Plan 05/30/2014 Office Visit Edited 05/30/2014  4:56 PM by Janith Lima, MD    I will recheck her K+ level and will treat if needed, will also check for Mg++ defic Will start dyazide to help with this as well      Tinea corporis   Last Assessment & Plan 05/30/2014 Office Visit Written 05/30/2014  4:52 PM by Janith Lima, MD    Will  treat with ketoconazole cream      Visit for screening mammogram       Imaging: Ct Head Wo Contrast  09/28/2014   CLINICAL DATA:  Status post fall. Hit back of head on cabinet. Headache and left-sided neck pain, radiating to the left shoulder and arm. Slight posterior neck pain. Initial encounter.  EXAM: CT HEAD WITHOUT CONTRAST  CT CERVICAL SPINE WITHOUT CONTRAST  TECHNIQUE: Multidetector CT imaging of the head and cervical spine was performed following the standard protocol without intravenous contrast. Multiplanar CT image reconstructions of the cervical spine were also generated.  COMPARISON:  MRI of the brain performed 12/05/2013, and CT of the head performed 11/21/2013. Neck radiographs from 12/18/2007  FINDINGS: CT HEAD FINDINGS  There is no evidence of acute  infarction, mass lesion, or intra- or extra-axial hemorrhage on CT.  Prominence of the sulci suggests mild cortical volume loss. Bilateral deep brain stimulation leads are noted. Scattered periventricular white matter change likely reflects small vessel ischemic microangiopathy. Mild cerebellar atrophy is noted.  The brainstem and fourth ventricle are within normal limits. The basal ganglia are unremarkable in appearance. The cerebral hemispheres demonstrate grossly normal gray-white differentiation. No mass effect or midline shift is seen.  There is no evidence of fracture; visualized osseous structures are unremarkable in appearance. The visualized portions of the orbits are within normal limits. The paranasal sinuses and mastoid air cells are well-aerated. No significant soft tissue abnormalities are seen.  CT CERVICAL SPINE FINDINGS  There is no evidence of fracture or subluxation. Multilevel disc space narrowing is noted along the cervical spine, with scattered anterior and posterior disc osteophyte complexes, and mild underlying facet disease. Vertebral bodies demonstrate normal height and alignment. Prevertebral soft tissues are within  normal limits.  A 1.7 cm hypodensity is noted within the right thyroid lobe. The visualized lung apices are clear. No significant soft tissue abnormalities are seen.  IMPRESSION: 1. No acute intracranial pathology seen on CT. 2. No evidence of fracture or subluxation along the cervical spine. 3. Mild cortical volume loss and scattered small vessel ischemic microangiopathy. 4. Minimal degenerative change noted along the cervical spine. 5. 1.7 cm hypodensity within the right thyroid lobe. Consider further evaluation with thyroid ultrasound. If patient is clinically hyperthyroid, consider nuclear medicine thyroid uptake and scan.   Electronically Signed   By: Garald Balding M.D.   On: 09/28/2014 21:24   Ct Cervical Spine Wo Contrast  09/28/2014   CLINICAL DATA:  Status post fall. Hit back of head on cabinet. Headache and left-sided neck pain, radiating to the left shoulder and arm. Slight posterior neck pain. Initial encounter.  EXAM: CT HEAD WITHOUT CONTRAST  CT CERVICAL SPINE WITHOUT CONTRAST  TECHNIQUE: Multidetector CT imaging of the head and cervical spine was performed following the standard protocol without intravenous contrast. Multiplanar CT image reconstructions of the cervical spine were also generated.  COMPARISON:  MRI of the brain performed 12/05/2013, and CT of the head performed 11/21/2013. Neck radiographs from 12/18/2007  FINDINGS: CT HEAD FINDINGS  There is no evidence of acute infarction, mass lesion, or intra- or extra-axial hemorrhage on CT.  Prominence of the sulci suggests mild cortical volume loss. Bilateral deep brain stimulation leads are noted. Scattered periventricular white matter change likely reflects small vessel ischemic microangiopathy. Mild cerebellar atrophy is noted.  The brainstem and fourth ventricle are within normal limits. The basal ganglia are unremarkable in appearance. The cerebral hemispheres demonstrate grossly normal gray-white differentiation. No mass effect or  midline shift is seen.  There is no evidence of fracture; visualized osseous structures are unremarkable in appearance. The visualized portions of the orbits are within normal limits. The paranasal sinuses and mastoid air cells are well-aerated. No significant soft tissue abnormalities are seen.  CT CERVICAL SPINE FINDINGS  There is no evidence of fracture or subluxation. Multilevel disc space narrowing is noted along the cervical spine, with scattered anterior and posterior disc osteophyte complexes, and mild underlying facet disease. Vertebral bodies demonstrate normal height and alignment. Prevertebral soft tissues are within normal limits.  A 1.7 cm hypodensity is noted within the right thyroid lobe. The visualized lung apices are clear. No significant soft tissue abnormalities are seen.  IMPRESSION: 1. No acute intracranial pathology seen on CT. 2. No evidence of  fracture or subluxation along the cervical spine. 3. Mild cortical volume loss and scattered small vessel ischemic microangiopathy. 4. Minimal degenerative change noted along the cervical spine. 5. 1.7 cm hypodensity within the right thyroid lobe. Consider further evaluation with thyroid ultrasound. If patient is clinically hyperthyroid, consider nuclear medicine thyroid uptake and scan.   Electronically Signed   By: Garald Balding M.D.   On: 09/28/2014 21:24   Dg Hand Complete Right  09/28/2014   CLINICAL DATA:  [place] tonight at 17:30. Hit her head. Right hand pain in the thumb. Right wrist pain.  EXAM: RIGHT HAND - COMPLETE 3+ VIEW  COMPARISON:  04/09/2014  FINDINGS: There is no evidence of fracture or dislocation. There is no evidence of arthropathy or other focal bone abnormality. Soft tissues are unremarkable.  IMPRESSION: Negative.   Electronically Signed   By: Nolon Nations M.D.   On: 09/28/2014 20:46

## 2014-10-11 NOTE — Progress Notes (Signed)
Cardiology Office Note   Date:  10/11/2014   ID:  [name], DOB April 15, 1950, MRN [id number]  PCP:  Scarlette Calico, MD  Cardiologist:  Woodroe Chen, NP   Chief Complaint  Patient presents with  . Tachycardia    AV Nodal reentrant tachycardia  . Hypertension      History of Present Illness: [name] is a 64 y.o. 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 Rayann Heman in 2009. She was last seen by Dr. Bronson Ing on 03/17/2013 and was found to be clinically stable.   She has since been seen in the ER for complaints of a fall with injury to her right hand. She did not have a fracture, only a contusion. CT scan demonstrated no evidence of fracture or subluxation. There was a 1.7 cm hypodensity noted within the right thyroid lobe.   She is complaining of mild LEE., She is wondering if she needs a diuretic. She drinks a good bit of water and admits to salty foods. She states that if she doesn't eat salt she becomes , sodium gets low and she has seizures.. She is also on amlodipine. She is sedentary due to musculoskeletal problems with knees. She is no longer on antibiotics.   Past Medical History  Diagnosis Date  . 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 [name], residual palpitations  . Vocal cord dysfunction   . Tremor, essential     takes Mysoline and Neurontin daily.  . Allergic rhinitis     uses Nasonex daily as needed  . Low sodium syndrome   . Chronic back pain     reason unknown  . Lumbar radiculopathy   . DDD (degenerative disc disease), lumbar   . Esophageal reflux     takes Zantac daily  . Hypertension     takes Losartan daily  . Anxiety     takes Ativan daily as needed  . Hypothyroidism     takes Synthroid daily  . Depression     takes Zoloft daily as well as Cymbalta  . Diabetes mellitus     takes NOvolin daily  . Asthma     Albuterol inhaler and  Neb daily as needed  . History of bronchitis Dec 2014  . Seizures 04-05-13    one time ran out of Primidone and had stopped it cold [place]  . Weakness     in left arm  . Joint pain   . Joint swelling     left ankle  . Dysphagia   . Constipation   . Allergy to angiotensin receptor blockers (ARB) 01/22/2014    Angioedema  . 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 Rayann Heman 2007    Past Surgical History  Procedure Laterality Date  . Tonsillectomy    . Right breast cyst      benign  . Left ankle ligament repair      x 2  . Left elbow repair    . Partial hysterectomy    . Cholecystectomy    . Cesarean section    . Ablation for avnrt    . Colonoscopy  01/16/2004    KWI:OXBDZH rectum/colon  . Esophagogastroduodenoscopy (egd) with esophageal dilation  04/03/2002    GDJ:MEQASTMH'D ring, otherwise normal esophagus, status post dilation with 54 F/Normal stomach  . Bartholin gland cyst excision      x 2  . Esophagogastroduodenoscopy (egd)  with esophageal dilation N/A 11/08/2012    Procedure: ESOPHAGOGASTRODUODENOSCOPY (EGD) WITH ESOPHAGEAL DILATION;  Surgeon: Daneil Dolin, MD;  Location: [facility];  Service: Endoscopy;  Laterality: N/A;  11:30  . Subthalamic stimulator insertion Bilateral 12/04/2013    Procedure: Bilateral Deep brain stimulator placement;  Surgeon: [name], MD;  Location: [facility];  Service: Neurosurgery;  Laterality: Bilateral;  Bilateral Deep brain stimulator placement  . Pulse generator implant Bilateral 12/15/2013    Procedure: BILATERAL PULSE GENERATOR IMPLANT;  Surgeon: [name], MD;  Location: [facility];  Service: Neurosurgery;  Laterality: Bilateral;  BILATERAL  . Maximum access (mas)posterior lumbar interbody fusion (plif) 1 level N/A 04/24/2014    Procedure: Lumbar four-five Maximum access Surgery posterior lumbar interbody fusion;  Surgeon: [name], MD;  Location: [facility];  Service: Neurosurgery;  Laterality: N/A;  Lumbar four-five  Maximum access Surgery posterior lumbar interbody fusion     Current Outpatient Prescriptions  Medication Sig Dispense Refill  . albuterol (PROAIR HFA) 108 (90 BASE) MCG/ACT inhaler Inhale 2 puffs into the lungs every 6 (six) hours as needed for wheezing or shortness of breath. 8.5 g 5  . amLODipine (NORVASC) 10 MG tablet Take 10 mg by mouth daily.    Marland Kitchen amoxicillin (AMOXIL) 500 MG capsule Take 1 capsule (500 mg total) by mouth 3 (three) times daily. 21 capsule 0  . aspirin EC 81 MG tablet Take 81 mg by mouth every morning.     . Azelastine-Fluticasone 137-50 MCG/ACT SUSP Place 1-2 puffs into the nose at bedtime. 6 g 0  . canagliflozin (INVOKANA) 300 MG TABS tablet Take 300 mg by mouth daily. 90 tablet 3  . DULoxetine (CYMBALTA) 60 MG capsule Take 1 capsule (60 mg total) by mouth daily. 30 capsule 11  . Fluticasone Furoate-Vilanterol (BREO ELLIPTA) 200-25 MCG/INH AEPB Inhale 1 puff into the lungs daily. 2 each 0  . fluticasone-salmeterol (ADVAIR HFA) 115-21 MCG/ACT inhaler Inhale 2 puffs into the lungs 2 (two) times daily. 1 Inhaler 0  . gabapentin (NEURONTIN) 300 MG capsule Take 1 tablet in the morning, 2 at night 270 capsule 1  . glucose blood test strip Use TID  E11.9 100 each 12  . guaiFENesin (ROBITUSSIN) 100 MG/5ML liquid Take 10 mLs (200 mg total) by mouth 3 (three) times daily as needed for cough. 120 mL 2  . guaiFENesin-codeine (ROBITUSSIN AC) 100-10 MG/5ML syrup Take 5 mLs by mouth every 6 (six) hours as needed for cough. 120 mL 0  . HYDROcodone-acetaminophen (NORCO) 10-325 MG per tablet Take 1 tablet by mouth every 6 (six) hours as needed. 75 tablet 0  . Insulin Glargine (TOUJEO SOLOSTAR) 300 UNIT/ML SOPN Inject 50 Units into the skin daily. 1.5 mL 11  . Insulin Lispro, Human, (HUMALOG KWIKPEN) 200 UNIT/ML SOPN Inject 5 Units into the skin 3 (three) times daily with meals. 9 mL 3  . ketoconazole (NIZORAL) 2 % cream Apply 1 application topically daily. 60 g 2  . levothyroxine  (SYNTHROID, LEVOTHROID) 75 MCG tablet TAKE ONE TABLET BY MOUTH ONCE DAILY BEFORE BREAKFAST 90 tablet 2  . metoprolol succinate (TOPROL-XL) 50 MG 24 hr tablet TAKE ONE TABLET BY MOUTH TWICE DAILY, TAKE WITH A MEAL OR IMMEDIATELY FOLLOWING A MEAL. 60 tablet 11  . Multiple Vitamins-Minerals (MULTIVITAMIN PO) Take 1 tablet by mouth daily.    . Omega-3 Fatty Acids (FISH OIL PO) Take 1 capsule by mouth daily.    Marland Kitchen omeprazole (PRILOSEC) 40 MG capsule Take 1 capsule (  40 mg total) by mouth daily. 30 capsule 11  . predniSONE (DELTASONE) 20 MG tablet Take 1 tablet once daily x 6 days 6 tablet 0  . primidone (MYSOLINE) 50 MG tablet TAKE THREE TABLETS BY MOUTH TWICE DAILY (Patient taking differently: TAKE Two TABLETS BY MOUTH TWICE DAILY) 180 tablet 3  . RELION INSULIN SYR 1CC/30G 30G X 5/16" 1 ML MISC   0  . sertraline (ZOLOFT) 100 MG tablet Take one and one half tablets daily (Patient taking differently: Take 100 mg by mouth at bedtime. ) 45 tablet 2  . Tiotropium Bromide Monohydrate (SPIRIVA RESPIMAT) 2.5 MCG/ACT AERS Inhale 2 puffs into the lungs daily. 1 Inhaler 0  . tiZANidine (ZANAFLEX) 4 MG tablet Take 4 mg by mouth 3 (three) times daily.    Marland Kitchen triamterene-hydrochlorothiazide (DYAZIDE) 37.5-25 MG per capsule Take 1 capsule by mouth daily.  1  . ULTICARE MICRO PEN NEEDLES 32G X 4 MM MISC USE TWICE DAILY 60 each 0   No current facility-administered medications for this visit.    Allergies:   Losartan; Metformin and related; Latex; Morphine and related; Other; and Oxycodone-acetaminophen    Social History:  The patient  reports that she quit smoking about 21 years ago. Her smoking use included Cigarettes. She has a 3 pack-year smoking history. She has never used smokeless tobacco. She reports that she does not drink alcohol or use illicit drugs.   Family History:  The patient's family history includes Alcohol abuse in her father and mother; Anxiety disorder in her father and mother; COPD in her brother  and mother; Depression in her father and mother; Heart disease in her sister; Lung cancer in her father; Pneumonia in her mother; Supraventricular tachycardia in her mother. There is no history of Colon cancer.    ROS: .   All other systems are reviewed and negative.Unless otherwise mentioned in H&P above.   PHYSICAL EXAM: VS:  BP 110/68 mmHg  Pulse 88  Ht 5' 4.5" (1.638 m)  Wt 180 lb (81.647 kg)  BMI 30.43 kg/m2  SpO2 96% , BMI Body mass index is 30.43 kg/(m^2). GEN: Well nourished, well developed, in no acute distress HEENT: normal Neck: no JVD, carotid bruits, or masses Cardiac: RRR slightly tachycardic,; no murmurs, rubs, or gallops,no edema  Respiratory:  Clear to auscultation bilaterally, normal work of breathing GI: soft, nontender, nondistended, + BS MS: no deformity or atrophy Skin: warm and dry, no rash Neuro:  Strength and sensation are intact Essential tremor. Psych: euthymic mood, full affect  Recent Labs: 05/30/2014: Magnesium 1.8; TSH 0.80 08/19/2014: ALT 33 09/10/2014: BUN 19; Creatinine, Ser 0.71; Hemoglobin 15.0; Platelets 208; Potassium 3.4*; Sodium 134*    Lipid Panel    Component Value Date/Time   CHOL 198 02/12/2014 0935   TRIG 180.0* 02/12/2014 0935   HDL 68.20 02/12/2014 0935   CHOLHDL 3 02/12/2014 0935   VLDL 36.0 02/12/2014 0935   LDLCALC 94 02/12/2014 0935      Wt Readings from Last 3 Encounters:  10/11/14 180 lb (81.647 kg)  09/28/14 200 lb (90.719 kg)  09/13/14 187 lb 9.6 oz (85.095 kg)      Other studies Reviewed: Additional studies/ records that were reviewed today include: None Review of the above records demonstrates: N/A   ASSESSMENT AND PLAN:  1.Hypertension: BP is well controlled. She does not have significant edema. WhenI remove her left knee brace there is significant indentation around the knee with mild edmea distal and proximal to the knee. There  is non-pitting edema of her ankles in dependent position. She is on two  medications that can contribute to this one being the amlodipine. I have given her reassurance and will not prescribe a diuretic with hx of hyponatremia. The edema is not significant enough to treat. She is advised to keep legs elevated when at home. Take off brace when she is in bed.  2. AV Nodal Reentrant tachycardia: HR is well controlled. She denies palpitations. No changes in her medications.   Current medicines are reviewed at length with the patient today.    Labs/ tests ordered today include: None No orders of the defined types were placed in this encounter.     Disposition:   FU with Dr. Raliegh Ip on previously scheduled appt.  Signed, Jory Sims, NP  10/11/2014 3:57 PM    [facility] [address], [place],  [address] Phone: [phone]; Fax: [phone]

## 2014-10-11 NOTE — Patient Instructions (Signed)
Your physician recommends that you schedule a follow-up appointment with Dr. Bronson Ing  Your physician recommends that you continue on your current medications as directed. Please refer to the Current Medication list given to you today.  Thank you for choosing [facility]!

## 2014-10-17 ENCOUNTER — Ambulatory Visit
Admission: RE | Admit: 2014-10-17 | Discharge: 2014-10-17 | Disposition: A | Discharge status: Home / Self Care | Payer: Commercial Managed Care - HMO | Source: Ambulatory Visit | Attending: Internal Medicine | Admitting: Internal Medicine

## 2014-10-17 DIAGNOSIS — Z1231 Encounter for screening mammogram for malignant neoplasm of breast: Secondary | ICD-10-CM

## 2014-10-18 LAB — HM MAMMOGRAPHY: HM Mammogram: NORMAL

## 2014-10-18 NOTE — Addendum Note (Signed)
Addended by: Janith Lima on: 10/18/2014 11:20 AM   Modules accepted: Miquel Dunn

## 2014-10-22 ENCOUNTER — Ambulatory Visit: Payer: Medicare Other | Admitting: Internal Medicine

## 2014-10-23 ENCOUNTER — Ambulatory Visit (INDEPENDENT_AMBULATORY_CARE_PROVIDER_SITE_OTHER): Payer: Commercial Managed Care - HMO | Admitting: Internal Medicine

## 2014-10-23 ENCOUNTER — Telehealth: Payer: Self-pay | Admitting: Internal Medicine

## 2014-10-23 ENCOUNTER — Other Ambulatory Visit (INDEPENDENT_AMBULATORY_CARE_PROVIDER_SITE_OTHER): Payer: Commercial Managed Care - HMO

## 2014-10-23 ENCOUNTER — Encounter: Payer: Self-pay | Admitting: Internal Medicine

## 2014-10-23 VITALS — BP 128/86 | HR 90 | Temp 98.7°F | Resp 16 | Ht 64.5 in | Wt 178.8 lb

## 2014-10-23 DIAGNOSIS — I10 Essential (primary) hypertension: Secondary | ICD-10-CM

## 2014-10-23 DIAGNOSIS — M48061 Spinal stenosis, lumbar region without neurogenic claudication: Secondary | ICD-10-CM

## 2014-10-23 DIAGNOSIS — M4806 Spinal stenosis, lumbar region: Secondary | ICD-10-CM

## 2014-10-23 DIAGNOSIS — E038 Other specified hypothyroidism: Secondary | ICD-10-CM

## 2014-10-23 DIAGNOSIS — E118 Type 2 diabetes mellitus with unspecified complications: Secondary | ICD-10-CM | POA: Diagnosis not present

## 2014-10-23 DIAGNOSIS — E876 Hypokalemia: Secondary | ICD-10-CM

## 2014-10-23 LAB — BASIC METABOLIC PANEL
BUN: 13 mg/dL (ref 6–23)
CO2: 25 mEq/L (ref 19–32)
Calcium: 10.3 mg/dL (ref 8.4–10.5)
Chloride: 94 mEq/L — ABNORMAL LOW (ref 96–112)
Creatinine, Ser: 0.62 mg/dL (ref 0.40–1.20)
GFR: 102.36 mL/min (ref >60.00)
Glucose, Bld: 154 mg/dL — ABNORMAL HIGH (ref 70–99)
Potassium: 3.6 mEq/L (ref 3.5–5.1)
Sodium: 132 mEq/L — ABNORMAL LOW (ref 135–145)

## 2014-10-23 LAB — TSH: TSH: 0.6 u[IU]/mL (ref 0.35–4.50)

## 2014-10-23 LAB — HEMOGLOBIN A1C: Hgb A1c MFr Bld: 8.2 % — ABNORMAL HIGH (ref 4.6–6.5)

## 2014-10-23 MED ORDER — HYDROCODONE-ACETAMINOPHEN 10-325 MG PO TABS: 1.0000 | ORAL_TABLET | Freq: Four times a day (QID) | ORAL | Status: DC | PRN

## 2014-10-23 NOTE — Telephone Encounter (Signed)
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 They just need to bring it by

## 2014-10-23 NOTE — Telephone Encounter (Signed)
Patient daughter [name] would like to know if you fill out FL2 forms, please advise

## 2014-10-23 NOTE — Progress Notes (Signed)
Pre visit review using our clinic review tool, if applicable. No additional management support is needed unless otherwise documented below in the visit note. 

## 2014-10-23 NOTE — Patient Instructions (Signed)

## 2014-10-23 NOTE — Progress Notes (Signed)
Subjective:  Patient ID: [name], female    DOB: 09-May-1950  Age: 64 y.o. MRN: [id number]  CC: Hypothyroidism; Hypertension; and Diabetes   HPI Mehek Grega presents for blood sugar check. She offers no new complaints today. She has persistent arthritis pain and requests a refill on Vicodin.  Outpatient Prescriptions Prior to Visit  Medication Sig Dispense Refill  . albuterol (PROAIR HFA) 108 (90 BASE) MCG/ACT inhaler Inhale 2 puffs into the lungs every 6 (six) hours as needed for wheezing or shortness of breath. 8.5 g 5  . aspirin EC 81 MG tablet Take 81 mg by mouth every morning.     . canagliflozin (INVOKANA) 300 MG TABS tablet Take 300 mg by mouth daily. 90 tablet 3  . DULoxetine (CYMBALTA) 60 MG capsule Take 1 capsule (60 mg total) by mouth daily. 30 capsule 11  . Fluticasone Furoate-Vilanterol (BREO ELLIPTA) 200-25 MCG/INH AEPB Inhale 1 puff into the lungs daily. 2 each 0  . gabapentin (NEURONTIN) 300 MG capsule Take 1 tablet in the morning, 2 at night (Patient taking differently: Take 300-600 mg by mouth 2 (two) times daily. Take 1 tablet in the morning, 2 at night) 270 capsule 1  . glucose blood test strip Use TID  E11.9 100 each 12  . guaiFENesin (ROBITUSSIN) 100 MG/5ML liquid Take 10 mLs (200 mg total) by mouth 3 (three) times daily as needed for cough. 120 mL 2  . Insulin Glargine (TOUJEO SOLOSTAR) 300 UNIT/ML SOPN Inject 50 Units into the skin daily. 1.5 mL 11  . Insulin Lispro, Human, (HUMALOG KWIKPEN) 200 UNIT/ML SOPN Inject 5 Units into the skin 3 (three) times daily with meals. 9 mL 3  . ketoconazole (NIZORAL) 2 % cream Apply 1 application topically daily. 60 g 2  . levothyroxine (SYNTHROID, LEVOTHROID) 75 MCG tablet TAKE ONE TABLET BY MOUTH ONCE DAILY BEFORE BREAKFAST 90 tablet 2  . Multiple Vitamins-Minerals (MULTIVITAMIN PO) Take 1 tablet by mouth daily.    . Omega-3 Fatty Acids (FISH OIL PO) Take 1 capsule by mouth daily.    Marland Kitchen omeprazole (PRILOSEC) 40 MG  capsule Take 1 capsule (40 mg total) by mouth daily. 30 capsule 11  . primidone (MYSOLINE) 50 MG tablet TAKE THREE TABLETS BY MOUTH TWICE DAILY (Patient taking differently: TAKE TWO TABLETS BY MOUTH TWICE DAILY) 180 tablet 3  . sertraline (ZOLOFT) 100 MG tablet Take one and one half tablets daily (Patient taking differently: Take 100 mg by mouth at bedtime. ) 45 tablet 2  . tiZANidine (ZANAFLEX) 4 MG tablet Take 4 mg by mouth at bedtime as needed for muscle spasms.     Marland Kitchen triamterene-hydrochlorothiazide (DYAZIDE) 37.5-25 MG per capsule Take 1 capsule by mouth daily.  1  . ULTICARE MICRO PEN NEEDLES 32G X 4 MM MISC USE TWICE DAILY 60 each 0  . amLODipine (NORVASC) 10 MG tablet Take 10 mg by mouth daily.    . Azelastine-Fluticasone 137-50 MCG/ACT SUSP Place 1-2 puffs into the nose at bedtime. 6 g 0  . fluticasone-salmeterol (ADVAIR HFA) 115-21 MCG/ACT inhaler Inhale 2 puffs into the lungs 2 (two) times daily. 1 Inhaler 0  . guaiFENesin-codeine (ROBITUSSIN AC) 100-10 MG/5ML syrup Take 5 mLs by mouth every 6 (six) hours as needed for cough. 120 mL 0  . HYDROcodone-acetaminophen (NORCO) 10-325 MG per tablet Take 1 tablet by mouth every 6 (six) hours as needed. 75 tablet 0  . metoprolol succinate (TOPROL-XL) 50 MG 24 hr tablet TAKE ONE TABLET BY MOUTH TWICE DAILY,  TAKE WITH A MEAL OR IMMEDIATELY FOLLOWING A MEAL. 60 tablet 11  . predniSONE (DELTASONE) 20 MG tablet Take 1 tablet once daily x 6 days (Patient not taking: Reported on 10/23/2014) 6 tablet 0  . RELION INSULIN SYR 1CC/30G 30G X 5/16" 1 ML MISC   0  . Tiotropium Bromide Monohydrate (SPIRIVA RESPIMAT) 2.5 MCG/ACT AERS Inhale 2 puffs into the lungs daily. (Patient not taking: Reported on 10/23/2014) 1 Inhaler 0   No facility-administered medications prior to visit.    ROS Review of Systems  Constitutional: Negative.  Negative for fever, chills, diaphoresis, appetite change and fatigue.  HENT: Negative.   Eyes: Negative.   Respiratory: Negative.   Negative for cough, choking, chest tightness and stridor.   Cardiovascular: Negative.  Negative for chest pain, palpitations and leg swelling.  Gastrointestinal: Negative.  Negative for nausea, vomiting, abdominal pain, diarrhea and constipation.  Endocrine: Positive for polydipsia. Negative for polyphagia and polyuria.  Genitourinary: Negative.  Negative for difficulty urinating.  Musculoskeletal: Positive for back pain, arthralgias and gait problem. Negative for myalgias, joint swelling, neck pain and neck stiffness.  Skin: Negative.  Negative for rash.  Allergic/Immunologic: Negative.   Neurological: Positive for tremors. Negative for dizziness, weakness and light-headedness.  Hematological: Negative.  Negative for adenopathy. Does not bruise/bleed easily.  Psychiatric/Behavioral: Negative.     Objective:  BP 128/86 mmHg  Pulse 90  Temp(Src) 98.7 F (37.1 C) (Oral)  Resp 16  Ht 5' 4.5" (1.638 m)  Wt 178 lb 12 oz (81.08 kg)  BMI 30.22 kg/m2  SpO2 96%  BP Readings from Last 3 Encounters:  10/24/14 116/81  10/23/14 128/86  10/11/14 110/68    Wt Readings from Last 3 Encounters:  10/24/14 179 lb (81.194 kg)  10/23/14 178 lb 12 oz (81.08 kg)  10/11/14 180 lb (81.647 kg)    Physical Exam  Constitutional: She is oriented to person, place, and time.  Non-toxic appearance. She does not have a sickly appearance. She does not appear ill. No distress.  HENT:  Mouth/Throat: Oropharynx is clear and moist. No oropharyngeal exudate.  Eyes: Conjunctivae are normal. Right eye exhibits no discharge. Left eye exhibits no discharge. No scleral icterus.  Neck: Normal range of motion. Neck supple. No JVD present. No tracheal deviation present. No thyromegaly present.  Cardiovascular: Normal rate, regular rhythm, normal heart sounds and intact distal pulses.  Exam reveals no gallop and no friction rub.   No murmur heard. Pulmonary/Chest: Effort normal and breath sounds normal. No stridor. No  respiratory distress. She has no wheezes. She has no rales. She exhibits no tenderness.  Abdominal: Soft. Bowel sounds are normal. She exhibits no distension and no mass. There is no tenderness. There is no rebound and no guarding.  Musculoskeletal: Normal range of motion. She exhibits no edema or tenderness.  Lymphadenopathy:    She has no cervical adenopathy.  Neurological: She is oriented to person, place, and time.  Skin: Skin is warm and dry. No rash noted. She is not diaphoretic. No erythema. No pallor.  Vitals reviewed.   Lab Results  Component Value Date   WBC 9.9 10/24/2014   HGB 13.6 10/24/2014   HCT 39.1 10/24/2014   PLT 207 10/24/2014   GLUCOSE 163* 10/24/2014   CHOL 198 02/12/2014   TRIG 180.0* 02/12/2014   HDL 68.20 02/12/2014   LDLCALC 94 02/12/2014   ALT 33 08/19/2014   AST 21 08/19/2014   NA 128* 10/24/2014   K 3.0* 10/24/2014   CL  91* 10/24/2014   CREATININE 0.62 10/24/2014   BUN 14 10/24/2014   CO2 22 10/24/2014   TSH 0.60 10/23/2014   INR 1.0 02/27/2008   HGBA1C 8.2* 10/23/2014   MICROALBUR 0.7 10/27/2013    Mm Digital Screening Bilateral  10/17/2014   CLINICAL DATA:  Screening.  EXAM: DIGITAL SCREENING BILATERAL MAMMOGRAM WITH CAD  COMPARISON:  Previous exam(s).  ACR Breast Density Category c: The breast tissue is heterogeneously dense, which may obscure small masses.  FINDINGS: There are no findings suspicious for malignancy. Images were processed with CAD.  IMPRESSION: No mammographic evidence of malignancy. A result letter of this screening mammogram will be mailed directly to the patient.  RECOMMENDATION: Screening mammogram in one year. (Code:SM-B-01Y)  BI-RADS CATEGORY  1: Negative.   Electronically Signed   By: Pamelia Hoit M.D.   On: 10/17/2014 17:15    Assessment & Plan:   Adrianna was seen today for hypothyroidism, hypertension and diabetes.  Diagnoses and all orders for this visit:  Essential hypertension- blood pressure is well  controlled. Orders: -     Basic metabolic panel; Future  Other specified hypothyroidism- her TSH is in the normal range, she will remain on the current dose. Orders: -     TSH; Future  Hypokalemia- will start a potassium supplement for this. Orders: -     Basic metabolic panel; Future  Type II diabetes mellitus with manifestations- her blood sugars are not well controlled, she will follow-up with her endocrinologist. Orders: -     Basic metabolic panel; Future -     Hemoglobin A1c; Future  Spinal stenosis of lumbar region Orders: -     HYDROcodone-acetaminophen (NORCO) 10-325 MG per tablet; Take 1 tablet by mouth every 6 (six) hours as needed. (Patient taking differently: Take 1 tablet by mouth every 6 (six) hours as needed for moderate pain. )   I have discontinued Ms. Kluge's RELION INSULIN SYR 1CC/30G, fluticasone-salmeterol, Azelastine-Fluticasone, Tiotropium Bromide Monohydrate, guaiFENesin-codeine, and predniSONE. I am also having her start on potassium chloride SA. Additionally, I am having her maintain her aspirin EC, albuterol, sertraline, glucose blood, Multiple Vitamins-Minerals (MULTIVITAMIN PO), Omega-3 Fatty Acids (FISH OIL PO), tiZANidine, DULoxetine, ketoconazole, Insulin Glargine, Insulin Lispro (Human), canagliflozin, primidone, omeprazole, levothyroxine, gabapentin, triamterene-hydrochlorothiazide, guaiFENesin, Fluticasone Furoate-Vilanterol, ULTICARE MICRO PEN NEEDLES, and HYDROcodone-acetaminophen.  Meds ordered this encounter  Medications  . HYDROcodone-acetaminophen (NORCO) 10-325 MG per tablet    Sig: Take 1 tablet by mouth every 6 (six) hours as needed.    Dispense:  75 tablet    Refill:  0  . potassium chloride SA (K-DUR,KLOR-CON) 20 MEQ tablet    Sig: Take 1 tablet (20 mEq total) by mouth once.    Dispense:  90 tablet    Refill:  1     Follow-up: Return in about 4 months (around 02/23/2015).  Scarlette Calico, MD

## 2014-10-24 ENCOUNTER — Encounter (HOSPITAL_COMMUNITY): Payer: Self-pay | Admitting: Cardiology

## 2014-10-24 ENCOUNTER — Emergency Department (HOSPITAL_COMMUNITY)
Admission: EM | Admit: 2014-10-24 | Discharge: 2014-10-24 | Disposition: A | Discharge status: Home / Self Care | Payer: Commercial Managed Care - HMO | Attending: Emergency Medicine | Admitting: Emergency Medicine

## 2014-10-24 DIAGNOSIS — Z79899 Other long term (current) drug therapy: Secondary | ICD-10-CM | POA: Insufficient documentation

## 2014-10-24 DIAGNOSIS — E119 Type 2 diabetes mellitus without complications: Secondary | ICD-10-CM | POA: Diagnosis not present

## 2014-10-24 DIAGNOSIS — Z87891 Personal history of nicotine dependence: Secondary | ICD-10-CM | POA: Insufficient documentation

## 2014-10-24 DIAGNOSIS — Z7982 Long term (current) use of aspirin: Secondary | ICD-10-CM | POA: Diagnosis not present

## 2014-10-24 DIAGNOSIS — Z7951 Long term (current) use of inhaled steroids: Secondary | ICD-10-CM | POA: Diagnosis not present

## 2014-10-24 DIAGNOSIS — G40909 Epilepsy, unspecified, not intractable, without status epilepticus: Secondary | ICD-10-CM | POA: Insufficient documentation

## 2014-10-24 DIAGNOSIS — Z794 Long term (current) use of insulin: Secondary | ICD-10-CM | POA: Diagnosis not present

## 2014-10-24 DIAGNOSIS — K219 Gastro-esophageal reflux disease without esophagitis: Secondary | ICD-10-CM | POA: Diagnosis not present

## 2014-10-24 DIAGNOSIS — Z8739 Personal history of other diseases of the musculoskeletal system and connective tissue: Secondary | ICD-10-CM | POA: Insufficient documentation

## 2014-10-24 DIAGNOSIS — F419 Anxiety disorder, unspecified: Secondary | ICD-10-CM | POA: Insufficient documentation

## 2014-10-24 DIAGNOSIS — F329 Major depressive disorder, single episode, unspecified: Secondary | ICD-10-CM | POA: Insufficient documentation

## 2014-10-24 DIAGNOSIS — I10 Essential (primary) hypertension: Secondary | ICD-10-CM | POA: Insufficient documentation

## 2014-10-24 DIAGNOSIS — E039 Hypothyroidism, unspecified: Secondary | ICD-10-CM | POA: Insufficient documentation

## 2014-10-24 DIAGNOSIS — Z91040 Latex allergy status: Secondary | ICD-10-CM | POA: Insufficient documentation

## 2014-10-24 DIAGNOSIS — R600 Localized edema: Secondary | ICD-10-CM | POA: Insufficient documentation

## 2014-10-24 DIAGNOSIS — R2243 Localized swelling, mass and lump, lower limb, bilateral: Secondary | ICD-10-CM | POA: Diagnosis present

## 2014-10-24 DIAGNOSIS — J45909 Unspecified asthma, uncomplicated: Secondary | ICD-10-CM | POA: Diagnosis not present

## 2014-10-24 DIAGNOSIS — G8929 Other chronic pain: Secondary | ICD-10-CM | POA: Insufficient documentation

## 2014-10-24 LAB — CBC
HCT: 39.1 % (ref 36.0–46.0)
Hemoglobin: 13.6 g/dL (ref 12.0–15.0)
MCH: 30.8 pg (ref 26.0–34.0)
MCHC: 34.8 g/dL (ref 30.0–36.0)
MCV: 88.7 fL (ref 78.0–100.0)
Platelets: 207 10*3/uL (ref 150–400)
RBC: 4.41 MIL/uL (ref 3.87–5.11)
RDW: 12.5 % (ref 11.5–15.5)
WBC: 9.9 10*3/uL (ref 4.0–10.5)

## 2014-10-24 LAB — BASIC METABOLIC PANEL
Anion gap: 15 (ref 5–15)
BUN: 14 mg/dL (ref 6–20)
CO2: 22 mmol/L (ref 22–32)
Calcium: 9 mg/dL (ref 8.9–10.3)
Chloride: 91 mmol/L — ABNORMAL LOW (ref 101–111)
Creatinine, Ser: 0.62 mg/dL (ref 0.44–1.00)
GFR calc Af Amer: >60 mL/min (ref >60)
GFR calc non Af Amer: >60 mL/min (ref >60)
Glucose, Bld: 163 mg/dL — ABNORMAL HIGH (ref 65–99)
Potassium: 3 mmol/L — ABNORMAL LOW (ref 3.5–5.1)
Sodium: 128 mmol/L — ABNORMAL LOW (ref 135–145)

## 2014-10-24 MED ORDER — METOPROLOL SUCCINATE ER 50 MG PO TB24
50.0000 mg | ORAL_TABLET | Freq: Every day | ORAL | Status: DC
  Administered 2014-10-24: 50 mg via ORAL
  Filled 2014-10-24 (×2): qty 1

## 2014-10-24 MED ORDER — METOPROLOL SUCCINATE ER 50 MG PO TB24: 50.0000 mg | ORAL_TABLET | Freq: Every day | ORAL | Status: DC

## 2014-10-24 MED ORDER — METOPROLOL TARTRATE 1 MG/ML IV SOLN
5.0000 mg | Freq: Once | INTRAVENOUS | Status: AC
  Administered 2014-10-24: 5 mg via INTRAVENOUS
  Filled 2014-10-24: qty 5

## 2014-10-24 NOTE — ED Notes (Signed)
Pt transported upstairs where husband is a patient in room 329

## 2014-10-24 NOTE — Discharge Instructions (Signed)
Peripheral Edema °You have swelling in your legs (peripheral edema). This swelling is due to excess accumulation of salt and water in your body. Edema may be a sign of heart, kidney or liver disease, or a side effect of a medication. It may also be due to problems in the leg veins. Elevating your legs and using special support stockings may be very helpful, if the cause of the swelling is due to poor venous circulation. Avoid long periods of standing, whatever the cause. °Treatment of edema depends on identifying the cause. Chips, pretzels, pickles and other salty foods should be avoided. Restricting salt in your diet is almost always needed. Water pills (diuretics) are often used to remove the excess salt and water from your body via urine. These medicines prevent the kidney from reabsorbing sodium. This increases urine flow. °Diuretic treatment may also result in lowering of potassium levels in your body. Potassium supplements may be needed if you have to use diuretics daily. Daily weights can help you keep track of your progress in clearing your edema. You should call your caregiver for follow up care as recommended. °SEEK IMMEDIATE MEDICAL CARE IF:  °· You have increased swelling, pain, redness, or heat in your legs. °· You develop shortness of breath, especially when lying down. °· You develop chest or abdominal pain, weakness, or fainting. °· You have a fever. °Document Released: 04/30/2004 Document Revised: 06/15/2011 Document Reviewed: 04/10/2009 °[employer]® Patient Information ©2015 [employer]. This information is not intended to replace advice given to you by your health care provider. Make sure you discuss any questions you have with your health care provider. ° °

## 2014-10-24 NOTE — ED Notes (Signed)
Out of her toprol since Monday and feels like her heart is beating fast.  History of SVT.  Also c/o lower extremity edema

## 2014-10-24 NOTE — ED Provider Notes (Signed)
CSN: [id number]     Arrival date & time 10/24/14  4132 History  This chart was scribed for Jola Schmidt, MD by Julien Nordmann, ED Scribe. This patient was seen in room APA04/APA04 and the patient's care was started at 10:00 AM.    Chief Complaint  Patient presents with  . Tachycardia  . Leg Swelling      The history is provided by the patient. No language interpreter was used.   HPI Comments: [name] is a 64 y.o. female who has a hx of IDDM, SVT and HTN to the Emergency Department complaining of gradual worsening bilateral leg swelling onset 3 days ago. She as been sleeping on the couch and has been unable to elevate her legs. Pt states she saw her PCP Dr. Ronnald Ramp yesterday for this problem but states he did not do anything for her. Pt states she has drank a lot of water but has not been urinating normally and she notes she is constantly thirsty. Pt also reports running out of toprol Monday which she takes for increased heart rate. She denies chest pain, shortness of breath, hx of blood clot in legs/lungs and hx of CHF.   Past Medical History  Diagnosis Date  . 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 [name], residual palpitations  . Vocal cord dysfunction   . Tremor, essential     takes Mysoline and Neurontin daily.  . Allergic rhinitis     uses Nasonex daily as needed  . Low sodium syndrome   . Chronic back pain     reason unknown  . Lumbar radiculopathy   . DDD (degenerative disc disease), lumbar   . Esophageal reflux     takes Zantac daily  . Hypertension     takes Losartan daily  . Anxiety     takes Ativan daily as needed  . Hypothyroidism     takes Synthroid daily  . Depression     takes Zoloft daily as well as Cymbalta  . Diabetes mellitus     takes NOvolin daily  . Asthma     Albuterol inhaler and Neb daily as needed  . History of bronchitis Dec 2014  . Seizures 04-05-13    one time ran out of Primidone and had stopped  it cold [place]  . Weakness     in left arm  . Joint pain   . Joint swelling     left ankle  . Dysphagia   . Constipation   . Allergy to angiotensin receptor blockers (ARB) 01/22/2014    Angioedema  . 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 Rayann Heman 2007   Past Surgical History  Procedure Laterality Date  . Tonsillectomy    . Right breast cyst      benign  . Left ankle ligament repair      x 2  . Left elbow repair    . Partial hysterectomy    . Cholecystectomy    . Cesarean section    . Ablation for avnrt    . Colonoscopy  01/16/2004    GMW:[id number] rectum/colon  . Esophagogastroduodenoscopy (egd) with esophageal dilation  04/03/2002    DGU:[id number] ring, otherwise normal esophagus, status post dilation with 56 F/Normal stomach  . Bartholin gland cyst excision      x 2  . Esophagogastroduodenoscopy (egd) with esophageal dilation N/A 11/08/2012    Procedure: ESOPHAGOGASTRODUODENOSCOPY (EGD) WITH ESOPHAGEAL DILATION;  Surgeon: Cristopher Estimable  [name], MD;  [facility];  Service: Endoscopy;  Laterality: N/A;  11:30  . Subthalamic stimulator insertion Bilateral 12/04/2013    Procedure: Bilateral Deep brain stimulator placement;  Surgeon: [name], MD;  Location: [facility] [facility];  Service: Neurosurgery;  Laterality: Bilateral;  Bilateral Deep brain stimulator placement  . Pulse generator implant Bilateral 12/15/2013    Procedure: BILATERAL PULSE GENERATOR IMPLANT;  Surgeon: [name], MD;  Location: [place];  Service: Neurosurgery;  Laterality: Bilateral;  BILATERAL  . Maximum access (mas)posterior lumbar interbody fusion (plif) 1 level N/A 04/24/2014    Procedure: Lumbar four-five Maximum access Surgery posterior lumbar interbody fusion;  Surgeon: [name], MD;  Location: [place] [facility];  Service: Neurosurgery;  Laterality: N/A;  Lumbar four-five Maximum access Surgery posterior lumbar interbody fusion   Family History  Problem Relation Age of Onset  . Lung cancer  Father     DIED AGE 21 LUNG CA  . Alcohol abuse Father   . Anxiety disorder Father   . Depression Father   . COPD Mother     DIED AGE 33,MRSA,COPD,PNEUMONIA  . Pneumonia Mother     DIED AGE 33,MRSA,COPD,PNEUMONIA  . Supraventricular tachycardia Mother   . Anxiety disorder Mother   . Depression Mother   . Alcohol abuse Mother   . COPD Brother     AGE 74  . Heart disease Sister     DIED AGE 12, SMOKER,?HEART  . Colon cancer Neg Hx    History  Substance Use Topics  . Smoking status: Former Smoker -- 0.30 packs/day for 10 years    Types: Cigarettes    Quit date: 04/23/1993  . Smokeless tobacco: Never Used     Comment: quit smoking 25+yrs ago  . Alcohol Use: No     Comment: no alcohol in 81yrs   OB History    No data available     Review of Systems  A complete 10 system review of systems was obtained and all systems are negative except as noted in the HPI and PMH.    Allergies  Losartan; Metformin and related; Latex; Morphine and related; Other; and Oxycodone-acetaminophen  Home Medications   Prior to Admission medications   Medication Sig Start Date End Date Taking? Authorizing Provider  albuterol (PROAIR HFA) 108 (90 BASE) MCG/ACT inhaler Inhale 2 puffs into the lungs every 6 (six) hours as needed for wheezing or shortness of breath. 08/03/13   Tammy S Parrett, NP  amLODipine (NORVASC) 10 MG tablet Take 10 mg by mouth daily.    Historical Provider, MD  aspirin EC 81 MG tablet Take 81 mg by mouth every morning.     Historical Provider, MD  canagliflozin (INVOKANA) 300 MG TABS tablet Take 300 mg by mouth daily. 07/02/14   Janith Lima, MD  DULoxetine (CYMBALTA) 60 MG capsule Take 1 capsule (60 mg total) by mouth daily. 05/30/14 05/30/15  Janith Lima, MD  Fluticasone Furoate-Vilanterol (BREO ELLIPTA) 200-25 MCG/INH AEPB Inhale 1 puff into the lungs daily. 09/28/14   Deneise Lever, MD  gabapentin (NEURONTIN) 300 MG capsule Take 1 tablet in the morning, 2 at night 08/29/14    Eustace Quail Tat, DO  glucose blood test strip Use TID  E11.9 04/10/14   Biagio Borg, MD  guaiFENesin (ROBITUSSIN) 100 MG/5ML liquid Take 10 mLs (200 mg total) by mouth 3 (three) times daily as needed for cough. 09/13/14   Deneise Lever, MD  HYDROcodone-acetaminophen (NORCO) 10-325 MG per  tablet Take 1 tablet by mouth every 6 (six) hours as needed. 10/23/14   Janith Lima, MD  Insulin Glargine (TOUJEO SOLOSTAR) 300 UNIT/ML SOPN Inject 50 Units into the skin daily. 07/02/14   Janith Lima, MD  Insulin Lispro, Human, (HUMALOG KWIKPEN) 200 UNIT/ML SOPN Inject 5 Units into the skin 3 (three) times daily with meals. 07/02/14   Janith Lima, MD  ketoconazole (NIZORAL) 2 % cream Apply 1 application topically daily. 05/30/14   Janith Lima, MD  levothyroxine (SYNTHROID, LEVOTHROID) 75 MCG tablet TAKE ONE TABLET BY MOUTH ONCE DAILY BEFORE BREAKFAST 08/29/14   Janith Lima, MD  metoprolol succinate (TOPROL-XL) 50 MG 24 hr tablet TAKE ONE TABLET BY MOUTH TWICE DAILY, TAKE WITH A MEAL OR IMMEDIATELY FOLLOWING A MEAL. 05/20/14   Janith Lima, MD  Multiple Vitamins-Minerals (MULTIVITAMIN PO) Take 1 tablet by mouth daily.    Historical Provider, MD  Omega-3 Fatty Acids (FISH OIL PO) Take 1 capsule by mouth daily.    Historical Provider, MD  omeprazole (PRILOSEC) 40 MG capsule Take 1 capsule (40 mg total) by mouth daily. 08/21/14   Janith Lima, MD  primidone (MYSOLINE) 50 MG tablet TAKE THREE TABLETS BY MOUTH TWICE DAILY Patient taking differently: TAKE Two TABLETS BY MOUTH TWICE DAILY 07/30/14   Janith Lima, MD  sertraline (ZOLOFT) 100 MG tablet Take one and one half tablets daily Patient taking differently: Take 100 mg by mouth at bedtime.  02/07/14   Cloria Spring, MD  tiZANidine (ZANAFLEX) 4 MG tablet Take 4 mg by mouth 3 (three) times daily.    Historical Provider, MD  triamterene-hydrochlorothiazide (DYAZIDE) 37.5-25 MG per capsule Take 1 capsule by mouth daily. 08/30/14   Historical Provider, MD   Flossie Buffy MICRO PEN NEEDLES 32G X 4 MM MISC USE TWICE DAILY 10/09/14   Renato Shin, MD   There were no vitals taken for this visit. Physical Exam  Constitutional: She is oriented to person, place, and time. She appears well-developed and well-nourished. No distress.  HENT:  Head: Normocephalic and atraumatic.  Eyes: EOM are normal.  Neck: Normal range of motion.  Cardiovascular: Normal rate, regular rhythm and normal heart sounds.   Pulmonary/Chest: Effort normal and breath sounds normal.  Abdominal: Soft. She exhibits no distension. There is no tenderness.  Musculoskeletal: Normal range of motion.  1+ edema bilaterally  Neurological: She is alert and oriented to person, place, and time.  Skin: Skin is warm and dry.  Psychiatric: She has a normal mood and affect. Judgment normal.  Nursing note and vitals reviewed.   ED Course  Procedures   COORDINATION OF CARE:  10:06 AM Discussed treatment plan which includes toprol and lab work with pt at bedside and pt agreed to plan.  Labs Review Labs Reviewed  BASIC METABOLIC PANEL - Abnormal; Notable for the following:    Sodium 128 (*)    Potassium 3.0 (*)    Chloride 91 (*)    Glucose, Bld 163 (*)    All other components within normal limits  CBC    Imaging Review No results found.   EKG Interpretation   Date/Time:  Wednesday October 24 2014 09:51:30 EDT Ventricular Rate:  114 PR Interval:  149 QRS Duration: 88 QT Interval:  337 QTC Calculation: 464 R Axis:   82 Text Interpretation:  Sinus tachycardia Borderline right axis deviation No  significant change was found Confirmed by Hanae Waiters  MD, Miyu Fenderson ([id number]) on  10/24/2014 9:58:34 AM  MDM   Final diagnoses:  Bilateral lower extremity edema    Patient is overall well-appearing.  Doubt DVT and pulmonary embolism.  Lungs are clear.  Chronic venous insufficiency.  Recommend compression stockings and elevation.  I suspect that her legs are more swollen now she's been  sleeping on the couch with her legs hanging down.  Recommended elevation as the primary mode of treatment for her lower extremity swelling.  I personally performed the services described in this documentation, which was scribed in my presence. The recorded information has been reviewed and is accurate.     Jola Schmidt, MD 10/24/14 [phone]

## 2014-10-25 MED ORDER — POTASSIUM CHLORIDE CRYS ER 20 MEQ PO TBCR: 20.0000 meq | EXTENDED_RELEASE_TABLET | Freq: Once | ORAL | Status: DC

## 2014-10-25 NOTE — Telephone Encounter (Signed)
Tried calling patient both number are invalid

## 2014-10-30 ENCOUNTER — Encounter: Payer: Self-pay | Admitting: Cardiovascular Disease

## 2014-10-30 ENCOUNTER — Ambulatory Visit (INDEPENDENT_AMBULATORY_CARE_PROVIDER_SITE_OTHER): Payer: Commercial Managed Care - HMO | Admitting: Cardiovascular Disease

## 2014-10-30 VITALS — BP 102/68 | HR 82 | Ht 64.0 in | Wt 174.0 lb

## 2014-10-30 DIAGNOSIS — R600 Localized edema: Secondary | ICD-10-CM

## 2014-10-30 DIAGNOSIS — I471 Supraventricular tachycardia: Secondary | ICD-10-CM | POA: Diagnosis not present

## 2014-10-30 DIAGNOSIS — Z9289 Personal history of other medical treatment: Secondary | ICD-10-CM

## 2014-10-30 DIAGNOSIS — Z87898 Personal history of other specified conditions: Secondary | ICD-10-CM

## 2014-10-30 DIAGNOSIS — I10 Essential (primary) hypertension: Secondary | ICD-10-CM | POA: Diagnosis not present

## 2014-10-30 DIAGNOSIS — R000 Tachycardia, unspecified: Secondary | ICD-10-CM | POA: Diagnosis not present

## 2014-10-30 NOTE — Patient Instructions (Signed)
Continue all current medications. Your physician wants you to follow up in:  1 year.  You will receive a reminder letter in the mail one-two months in advance.  If you don't receive a letter, please call our office to schedule the follow up appointment   

## 2014-10-30 NOTE — Progress Notes (Signed)
Patient ID: [name], female   DOB: 1951/02/21, 64 y.o.   MRN: [id number]      SUBJECTIVE: The patient returns for routine follow-up.  She was evaluated by K. Lawrence NP earlier this month. I last saw her in December 2014. She was recently evaluated in the ED for tachycardia and leg swelling and had run out of her metoprolol. It was recommended she wear compression stockings for chronic venous insufficiency and keep her legs elevated.  She has a history of AV nodal reentrant tachycardia and underwent ablation for this by Dr. Rayann Heman in 2009. She also has a history of hypertension, hyperlipidemia and diabetes mellitus. She has had chest pain in the past which was deemed gastrointestinal in etiology.  She has been noted to be drinking a significant amount of water and eating salt laden foods.  ECG on 10/24/2014 demonstrated sinus tachycardia, heart rate 114 bpm.  She no longer has leg swelling and has been able to keep her legs elevated. She denies chest pain and palpitations and is taking her metoprolol. She and her husband are considering an assisted living facility in [place].    Review of Systems: As per "subjective", otherwise negative.  Allergies  Allergen Reactions  . Losartan     Angioedema   . Metformin And Related Diarrhea  . Latex Other (See Comments)    Other Reaction: redness, blisters  . Morphine And Related Other (See Comments)    Pt. States med makes her crazy  . Other Other (See Comments)    Other Reaction: redness, blisters  . Oxycodone-Acetaminophen Itching    TYLOX. Caused internal itching per patient     Current Outpatient Prescriptions  Medication Sig Dispense Refill  . albuterol (PROAIR HFA) 108 (90 BASE) MCG/ACT inhaler Inhale 2 puffs into the lungs every 6 (six) hours as needed for wheezing or shortness of breath. 8.5 g 5  . aspirin EC 81 MG tablet Take 81 mg by mouth every morning.     . canagliflozin (INVOKANA) 300 MG TABS tablet Take 300 mg by  mouth daily. 90 tablet 3  . DULoxetine (CYMBALTA) 60 MG capsule Take 1 capsule (60 mg total) by mouth daily. 30 capsule 11  . Fluticasone Furoate-Vilanterol (BREO ELLIPTA) 200-25 MCG/INH AEPB Inhale 1 puff into the lungs daily. 2 each 0  . gabapentin (NEURONTIN) 300 MG capsule Take 1 tablet in the morning, 2 at night (Patient taking differently: Take 300-600 mg by mouth 2 (two) times daily. Take 1 tablet in the morning, 2 at night) 270 capsule 1  . glucose blood test strip Use TID  E11.9 100 each 12  . guaiFENesin (ROBITUSSIN) 100 MG/5ML liquid Take 10 mLs (200 mg total) by mouth 3 (three) times daily as needed for cough. 120 mL 2  . HYDROcodone-acetaminophen (NORCO) 10-325 MG per tablet Take 1 tablet by mouth every 6 (six) hours as needed. (Patient taking differently: Take 1 tablet by mouth every 6 (six) hours as needed for moderate pain. ) 75 tablet 0  . Insulin Glargine (TOUJEO SOLOSTAR) 300 UNIT/ML SOPN Inject 50 Units into the skin daily. 1.5 mL 11  . Insulin Lispro, Human, (HUMALOG KWIKPEN) 200 UNIT/ML SOPN Inject 5 Units into the skin 3 (three) times daily with meals. 9 mL 3  . ketoconazole (NIZORAL) 2 % cream Apply 1 application topically daily. 60 g 2  . levothyroxine (SYNTHROID, LEVOTHROID) 75 MCG tablet TAKE ONE TABLET BY MOUTH ONCE DAILY BEFORE BREAKFAST 90 tablet 2  . metoprolol succinate (TOPROL-XL) 50  MG 24 hr tablet Take 1 tablet (50 mg total) by mouth daily. Take with or immediately following a meal. 30 tablet 0  . Multiple Vitamins-Minerals (MULTIVITAMIN PO) Take 1 tablet by mouth daily.    . Omega-3 Fatty Acids (FISH OIL PO) Take 1 capsule by mouth daily.    . primidone (MYSOLINE) 50 MG tablet TAKE THREE TABLETS BY MOUTH TWICE DAILY (Patient taking differently: TAKE TWO TABLETS BY MOUTH TWICE DAILY) 180 tablet 3  . ranitidine (ZANTAC) 150 MG capsule Take 150 mg by mouth 2 (two) times daily.    . sertraline (ZOLOFT) 100 MG tablet Take one and one half tablets daily (Patient taking  differently: Take 100 mg by mouth at bedtime. ) 45 tablet 2  . tiZANidine (ZANAFLEX) 4 MG tablet Take 4 mg by mouth at bedtime as needed for muscle spasms.     Marland Kitchen triamterene-hydrochlorothiazide (DYAZIDE) 37.5-25 MG per capsule Take 1 capsule by mouth daily.  1  . ULTICARE MICRO PEN NEEDLES 32G X 4 MM MISC USE TWICE DAILY 60 each 0   No current facility-administered medications for this visit.    Past Medical History  Diagnosis Date  . 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 [name], residual palpitations  . Vocal cord dysfunction   . Tremor, essential     takes Mysoline and Neurontin daily.  . Allergic rhinitis     uses Nasonex daily as needed  . Low sodium syndrome   . Chronic back pain     reason unknown  . Lumbar radiculopathy   . DDD (degenerative disc disease), lumbar   . Esophageal reflux     takes Zantac daily  . Hypertension     takes Losartan daily  . Anxiety     takes Ativan daily as needed  . Hypothyroidism     takes Synthroid daily  . Depression     takes Zoloft daily as well as Cymbalta  . Diabetes mellitus     takes NOvolin daily  . Asthma     Albuterol inhaler and Neb daily as needed  . History of bronchitis Dec 2014  . Seizures 04-05-13    one time ran out of Primidone and had stopped it cold [place]  . Weakness     in left arm  . Joint pain   . Joint swelling     left ankle  . Dysphagia   . Constipation   . Allergy to angiotensin receptor blockers (ARB) 01/22/2014    Angioedema  . 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 Rayann Heman 2007    Past Surgical History  Procedure Laterality Date  . Tonsillectomy    . Right breast cyst      benign  . Left ankle ligament repair      x 2  . Left elbow repair    . Partial hysterectomy    . Cholecystectomy    . Cesarean section    . Ablation for avnrt    . Colonoscopy  01/16/2004    OIN:OMVEHM rectum/colon  . Esophagogastroduodenoscopy (egd) with esophageal  dilation  04/03/2002    CNO:[id number] ring, otherwise normal esophagus, status post dilation with 56 F/Normal stomach  . Bartholin gland cyst excision      x 2  . Esophagogastroduodenoscopy (egd) with esophageal dilation N/A 11/08/2012    Procedure: ESOPHAGOGASTRODUODENOSCOPY (EGD) WITH ESOPHAGEAL DILATION;  Surgeon: Daneil Dolin, MD;  [facility];  Service:  Endoscopy;  Laterality: N/A;  11:30  . Subthalamic stimulator insertion Bilateral 12/04/2013    Procedure: Bilateral Deep brain stimulator placement;  Surgeon: [name], MD;  Location: [facility] [facility];  Service: Neurosurgery;  Laterality: Bilateral;  Bilateral Deep brain stimulator placement  . Pulse generator implant Bilateral 12/15/2013    Procedure: BILATERAL PULSE GENERATOR IMPLANT;  Surgeon: [name], MD;  Location: [facility];  Service: Neurosurgery;  Laterality: Bilateral;  BILATERAL  . Maximum access (mas)posterior lumbar interbody fusion (plif) 1 level N/A 04/24/2014    Procedure: Lumbar four-five Maximum access Surgery posterior lumbar interbody fusion;  Surgeon: [name], MD;  Location: [facility];  Service: Neurosurgery;  Laterality: N/A;  Lumbar four-five Maximum access Surgery posterior lumbar interbody fusion    History   Social History  . Marital Status: Married    Spouse Name: N/A  . Number of Children: N/A  . Years of Education: N/A   Occupational History  . retired    Social History Main Topics  . Smoking status: Former Smoker -- 0.30 packs/day for 10 years    Types: Cigarettes    Quit date: 04/23/1993  . Smokeless tobacco: Never Used     Comment: quit smoking 25+yrs ago  . Alcohol Use: No     Comment: no alcohol in 6yrs  . Drug Use: No  . Sexual Activity: No   Other Topics Concern  . Not on file   Social History Narrative   Married, 1 daughte, living.  Lives with husband in [place], [place].   1 child died brain tumor at age 41.   Two grandchildren.   Works at [employer],  patient currently lost her job.   Retired 2011.   No tobacco.   Alcohol: none in 30 yrs.  Distant history of heavy use.   No drug use.     Filed Vitals:   10/30/14 1536  BP: 102/68  Pulse: 82  Height: 5\' 4"  (1.626 m)  Weight: 174 lb (78.926 kg)  SpO2: 98%    PHYSICAL EXAM General: NAD HEENT: Tremor noted. Neck: No JVD, no thyromegaly. Lungs: Clear to auscultation bilaterally with normal respiratory effort. CV: Nondisplaced PMI.  Regular rate and rhythm, normal S1/S2, no S3/S4, no murmur. No pretibial or periankle edema.  No carotid bruit.  Normal pedal pulses.  Abdomen: Soft, nontender, no distention.  Neurologic: Alert and oriented x 3.  Psych: Normal affect. Skin: Normal. Musculoskeletal: No gross deformities. Extremities: No clubbing or cyanosis.   ECG: Most recent ECG reviewed.      ASSESSMENT AND PLAN: 1. 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 Continue metoprolol for tachycardia.  2. Essential HTN: BP controlled today. No changes.  3. Leg swelling: Due to chronic venous insufficiency. No edema today. Agree with compression stocking use prn and leg elevation.  Dispo: f/u 1 year.  Kate Sable, M.D., F.A.C.C.

## 2014-11-01 ENCOUNTER — Other Ambulatory Visit: Payer: Self-pay | Admitting: Internal Medicine

## 2014-11-01 ENCOUNTER — Other Ambulatory Visit: Payer: Commercial Managed Care - HMO

## 2014-11-01 ENCOUNTER — Telehealth: Payer: Self-pay | Admitting: Internal Medicine

## 2014-11-01 DIAGNOSIS — Z111 Encounter for screening for respiratory tuberculosis: Secondary | ICD-10-CM

## 2014-11-01 MED ORDER — FLUTICASONE FUROATE-VILANTEROL 200-25 MCG/INH IN AEPB: 1.0000 | INHALATION_SPRAY | Freq: Every day | RESPIRATORY_TRACT | Status: DC

## 2014-11-01 NOTE — Telephone Encounter (Signed)
Pt in lobby Pt was in [place] and decided to come by to see if the samples were ready before she heads back to [place].  She needs samples of breo.

## 2014-11-01 NOTE — Telephone Encounter (Signed)
Pt in lobby requesting Breo 200 samples.  These have been given to pt.  Nothing further needed.

## 2014-11-03 LAB — QUANTIFERON TB GOLD ASSAY (BLOOD)
Interferon Gamma Release Assay: NEGATIVE
Mitogen value: >10 IU/mL
Quantiferon Nil Value: 0.05 IU/mL
Quantiferon Tb Ag Minus Nil Value: 0 IU/mL
TB Ag value: 0.05 IU/mL

## 2014-11-05 ENCOUNTER — Other Ambulatory Visit (INDEPENDENT_AMBULATORY_CARE_PROVIDER_SITE_OTHER): Payer: Commercial Managed Care - HMO

## 2014-11-05 ENCOUNTER — Ambulatory Visit (INDEPENDENT_AMBULATORY_CARE_PROVIDER_SITE_OTHER): Payer: Commercial Managed Care - HMO | Admitting: Internal Medicine

## 2014-11-05 ENCOUNTER — Encounter: Payer: Self-pay | Admitting: Family Medicine

## 2014-11-05 ENCOUNTER — Ambulatory Visit (INDEPENDENT_AMBULATORY_CARE_PROVIDER_SITE_OTHER): Payer: Commercial Managed Care - HMO | Admitting: Family Medicine

## 2014-11-05 VITALS — BP 116/74 | HR 83 | Ht 67.0 in | Wt 174.0 lb

## 2014-11-05 VITALS — BP 116/74 | HR 83 | Temp 98.3°F | Resp 16 | Ht 64.0 in | Wt 174.0 lb

## 2014-11-05 DIAGNOSIS — S62001A Unspecified fracture of navicular [scaphoid] bone of right wrist, initial encounter for closed fracture: Secondary | ICD-10-CM | POA: Diagnosis not present

## 2014-11-05 DIAGNOSIS — F32A Depression, unspecified: Secondary | ICD-10-CM | POA: Insufficient documentation

## 2014-11-05 DIAGNOSIS — S62009A Unspecified fracture of navicular [scaphoid] bone of unspecified wrist, initial encounter for closed fracture: Secondary | ICD-10-CM | POA: Insufficient documentation

## 2014-11-05 DIAGNOSIS — F450 Somatization disorder: Secondary | ICD-10-CM | POA: Diagnosis not present

## 2014-11-05 DIAGNOSIS — M25531 Pain in right wrist: Secondary | ICD-10-CM

## 2014-11-05 DIAGNOSIS — F329 Major depressive disorder, single episode, unspecified: Secondary | ICD-10-CM | POA: Diagnosis not present

## 2014-11-05 DIAGNOSIS — I10 Essential (primary) hypertension: Secondary | ICD-10-CM | POA: Diagnosis not present

## 2014-11-05 MED ORDER — VILAZODONE HCL 10 & 20 & 40 MG PO KIT: 1.0000 | PACK | Freq: Every day | ORAL | Status: DC

## 2014-11-05 NOTE — Progress Notes (Signed)
Pre visit review using our clinic review tool, if applicable. No additional management support is needed unless otherwise documented below in the visit note. 

## 2014-11-05 NOTE — Progress Notes (Signed)
[name] [place], [place] [address] Phone: [phone] Subjective:    CC: right wrist pain  IRW:[id number] [name] is a 64 y.o. female coming in with complaint of right wrist pain. Patient did fall approximately 3 weeks ago. Patient was seen in the emergency room and did have x-rays.x-rays are unremarkable for any type of fracture. Patient states that she continues to have pain. Patient did have a sharp pain when she has to push off. Patient states it seems to be more around her thumb as well as the wrist on the right side. Patient denies any swelling, denies any numbness, states though that the pain continues to give her difficulty. Patient states that it does affects some of her daily activity. Patient states if she tries of hold anything for a long amount of time and is very difficult. Past Medical History  Diagnosis Date  . 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 [name], residual palpitations  . Vocal cord dysfunction   . Tremor, essential     takes Mysoline and Neurontin daily.  . Allergic rhinitis     uses Nasonex daily as needed  . Low sodium syndrome   . Chronic back pain     reason unknown  . Lumbar radiculopathy   . DDD (degenerative disc disease), lumbar   . Esophageal reflux     takes Zantac daily  . Hypertension     takes Losartan daily  . Anxiety     takes Ativan daily as needed  . Hypothyroidism     takes Synthroid daily  . Depression     takes Zoloft daily as well as Cymbalta  . Diabetes mellitus     takes NOvolin daily  . Asthma     Albuterol inhaler and Neb daily as needed  . History of bronchitis Dec 2014  . Seizures 04-05-13    one time ran out of Primidone and had stopped it cold [place]  . Weakness     in left arm  . Joint pain   . Joint swelling     left ankle  . Dysphagia   . Constipation   . Allergy to angiotensin receptor blockers (ARB) 01/22/2014   Angioedema  . 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 Rayann Heman 2007   Past Surgical History  Procedure Laterality Date  . Tonsillectomy    . Right breast cyst      benign  . Left ankle ligament repair      x 2  . Left elbow repair    . Partial hysterectomy    . Cholecystectomy    . Cesarean section    . Ablation for avnrt    . Colonoscopy  01/16/2004    YPP:[id number] rectum/colon  . Esophagogastroduodenoscopy (egd) with esophageal dilation  04/03/2002    ZTI:[id number] ring, otherwise normal esophagus, status post dilation with 54 F/Normal stomach  . Bartholin gland cyst excision      x 2  . Esophagogastroduodenoscopy (egd) with esophageal dilation N/A 11/08/2012    Procedure: ESOPHAGOGASTRODUODENOSCOPY (EGD) WITH ESOPHAGEAL DILATION;  Surgeon: Daneil Dolin, MD;  Location: [facility];  Service: Endoscopy;  Laterality: N/A;  11:30  . Subthalamic stimulator insertion Bilateral 12/04/2013    Procedure: Bilateral Deep brain stimulator placement;  Surgeon: [name], MD;  Location: [facility];  Service: Neurosurgery;  Laterality: Bilateral;  Bilateral Deep brain stimulator placement  .  Pulse generator implant Bilateral 12/15/2013    Procedure: BILATERAL PULSE GENERATOR IMPLANT;  Surgeon: [name], MD;  Location: [facility];  Service: Neurosurgery;  Laterality: Bilateral;  BILATERAL  . Maximum access (mas)posterior lumbar interbody fusion (plif) 1 level N/A 04/24/2014    Procedure: Lumbar four-five Maximum access Surgery posterior lumbar interbody fusion;  Surgeon: [name], MD;  Location: [facility];  Service: Neurosurgery;  Laterality: N/A;  Lumbar four-five Maximum access Surgery posterior lumbar interbody fusion   History  Substance Use Topics  . Smoking status: Former Smoker -- 0.30 packs/day for 10 years    Types: Cigarettes    Quit date: 04/23/1993  . Smokeless tobacco: Never Used     Comment: quit smoking 25+yrs ago  . Alcohol Use: No     Comment: no alcohol in  51yrs   Allergies  Allergen Reactions  . Losartan     Angioedema   . Metformin And Related Diarrhea  . Latex Other (See Comments)    Other Reaction: redness, blisters  . Morphine And Related Other (See Comments)    Pt. States med makes her crazy  . Other Other (See Comments)    Other Reaction: redness, blisters  . Oxycodone-Acetaminophen Itching    TYLOX. Caused internal itching per patient    Family History  Problem Relation Age of Onset  . Lung cancer Father     DIED AGE 62 LUNG CA  . Alcohol abuse Father   . Anxiety disorder Father   . Depression Father   . COPD Mother     DIED AGE 47,MRSA,COPD,PNEUMONIA  . Pneumonia Mother     DIED AGE 47,MRSA,COPD,PNEUMONIA  . Supraventricular tachycardia Mother   . Anxiety disorder Mother   . Depression Mother   . Alcohol abuse Mother   . COPD Brother     AGE 54  . Heart disease Sister     DIED AGE 37, SMOKER,?HEART  . Colon cancer Neg Hx        Past medical history, social, surgical and family history all reviewed in electronic medical record.   Review of Systems: No headache, visual changes, nausea, vomiting, diarrhea, constipation, dizziness, abdominal pain, skin rash, fevers, chills, night sweats, weight loss, swollen lymph nodes, body aches, joint swelling, muscle aches, chest pain, shortness of breath, mood changes.   Objective Blood pressure 116/74, pulse 83, height 5\' 7"  (1.702 m), weight 174 lb (78.926 kg), SpO2 94 %.  General: No apparent distress alert and oriented x3 mood and affect normal, dressed appropriately. Patient does have tremor at [employer] speech impediment HEENT: Pupils equal, extraocular movements intact  Respiratory: Patient's speak in full sentences and does not appear short of breath  Cardiovascular: No lower extremity edema, non tender, no erythema  Skin: Warm dry intact with no signs of infection or rash on extremities or on axial skeleton.  Abdomen: Soft nontender  Neuro: Cranial nerves II  through XII are intact, neurovascularly intact in all extremities with 2+ DTRs and 2+ pulses.  Lymph: No lymphadenopathy of posterior or anterior cervical chain or axillae bilaterally.  Gait non-ambulatory  MSK:  Non tender with full range of motion and good stability and symmetric strength and tone of shoulders, elbows, hip, knee and ankles bilaterally.  Wrist:right Inspection normal with no visible erythema or swelling. ROM smooth and normal with good flexion and extension and ulnar/radial deviation that is symmetrical with opposite wrist. Severe tenderness to palpation over the scaphoid  snuffbox tendernessnoted. No tenderness over Canal of [place].  Strength 5/5 in all directions without pain. Negative Finkelstein, tinel's and phalens. Negative Watson's test.   MSK US performed of: right This study was ordered, performed, and interpreted by Charlann Boxer D.O.  Wrist: All extensor compartments visualized and tendons all normal in appearance without fraying, tears, or sheath effusions. No effusion seen. TFCC intact. Scapholunate ligament intact.mild degenerative changes of the scaphoid lunate joint. Patient also has what appears to be a nondisplaced fracture noted of the scaphoid distal pole. Mild hypoechoic changes noted. Carpal tunnel visualized and median nerve area normal, flexor tendons all normal in appearance without fraying, tears, or sheath effusions. Power doppler signal normal.  IMPRESSION: occult scaphoid fracture     Impression and Recommendations:     This case required medical decision making of moderate complexity.

## 2014-11-05 NOTE — Patient Instructions (Signed)
Good to see you :) Ice 20 minutes 2 times daily. Usually after activity and before bed. Wear brace day and night for next 3 weeks, can come out of the brace if needed at home See me again in 3 weeks.

## 2014-11-05 NOTE — Patient Instructions (Signed)

## 2014-11-05 NOTE — Assessment & Plan Note (Signed)
Patient does have more of a fracture that is nondisplaced. This is been 3 weeks and no significant callus formation noted on ultrasound today. Patient was put in a splint and will increase her vitamin D. Do not feel that patient would be a candidate for any surgical repair so advance imaging is not warranted at this time. Patient will come back in 3 weeks and at that time we'll make sure that there is healing.

## 2014-11-06 ENCOUNTER — Encounter: Payer: Self-pay | Admitting: Internal Medicine

## 2014-11-06 MED ORDER — METOPROLOL SUCCINATE ER 50 MG PO TB24: 50.0000 mg | ORAL_TABLET | Freq: Every day | ORAL | Status: DC

## 2014-11-06 NOTE — Progress Notes (Signed)
Subjective:  Patient ID: [name], female    DOB: 08/13/1950  Age: 64 y.o. MRN: [id number]  CC: Hypertension   HPI [name] presents for blood pressure check. She was recently seen in the emergency room for tachycardia. She had been a long-term beta blocker therapy and then was not able to buy the beta blocker, she ran out and a few days later developed tachycardia. She is back on the beta blocker and feeling well. She also requested change in antidepressants today. She has been on Zoloft and Cymbalta but says she still feels irritable and tearful. Her husband has had a good response to [place] and she wants to try that.  Outpatient Prescriptions Prior to Visit  Medication Sig Dispense Refill  . albuterol (PROAIR HFA) 108 (90 BASE) MCG/ACT inhaler Inhale 2 puffs into the lungs every 6 (six) hours as needed for wheezing or shortness of breath. 8.5 g 5  . aspirin EC 81 MG tablet Take 81 mg by mouth every morning.     . canagliflozin (INVOKANA) 300 MG TABS tablet Take 300 mg by mouth daily. 90 tablet 3  . Fluticasone Furoate-Vilanterol (BREO ELLIPTA) 200-25 MCG/INH AEPB Inhale 1 puff into the lungs daily. 2 each 0  . gabapentin (NEURONTIN) 300 MG capsule Take 1 tablet in the morning, 2 at night (Patient taking differently: Take 300-600 mg by mouth 2 (two) times daily. Take 1 tablet in the morning, 2 at night) 270 capsule 1  . glucose blood test strip Use TID  E11.9 100 each 12  . guaiFENesin (ROBITUSSIN) 100 MG/5ML liquid Take 10 mLs (200 mg total) by mouth 3 (three) times daily as needed for cough. 120 mL 2  . HYDROcodone-acetaminophen (NORCO) 10-325 MG per tablet Take 1 tablet by mouth every 6 (six) hours as needed. (Patient taking differently: Take 1 tablet by mouth every 6 (six) hours as needed for moderate pain. ) 75 tablet 0  . Insulin Glargine (TOUJEO SOLOSTAR) 300 UNIT/ML SOPN Inject 50 Units into the skin daily. 1.5 mL 11  . Insulin Lispro, Human, (HUMALOG KWIKPEN) 200  UNIT/ML SOPN Inject 5 Units into the skin 3 (three) times daily with meals. 9 mL 3  . ketoconazole (NIZORAL) 2 % cream Apply 1 application topically daily. 60 g 2  . levothyroxine (SYNTHROID, LEVOTHROID) 75 MCG tablet TAKE ONE TABLET BY MOUTH ONCE DAILY BEFORE BREAKFAST 90 tablet 2  . Multiple Vitamins-Minerals (MULTIVITAMIN PO) Take 1 tablet by mouth daily.    . Omega-3 Fatty Acids (FISH OIL PO) Take 1 capsule by mouth daily.    . primidone (MYSOLINE) 50 MG tablet TAKE THREE TABLETS BY MOUTH TWICE DAILY (Patient taking differently: TAKE TWO TABLETS BY MOUTH TWICE DAILY) 180 tablet 3  . ranitidine (ZANTAC) 150 MG capsule Take 150 mg by mouth 2 (two) times daily.    Marland Kitchen tiZANidine (ZANAFLEX) 4 MG tablet Take 4 mg by mouth at bedtime as needed for muscle spasms.     Marland Kitchen triamterene-hydrochlorothiazide (DYAZIDE) 37.5-25 MG per capsule Take 1 capsule by mouth daily.  1  . ULTICARE MICRO PEN NEEDLES 32G X 4 MM MISC USE TWICE DAILY 60 each 0  . DULoxetine (CYMBALTA) 60 MG capsule Take 1 capsule (60 mg total) by mouth daily. 30 capsule 11  . metoprolol succinate (TOPROL-XL) 50 MG 24 hr tablet Take 1 tablet (50 mg total) by mouth daily. Take with or immediately following a meal. 30 tablet 0  . sertraline (ZOLOFT) 100 MG tablet Take one and one  half tablets daily (Patient taking differently: Take 100 mg by mouth at bedtime. ) 45 tablet 2   No facility-administered medications prior to visit.    ROS Review of Systems  Constitutional: Negative.  Negative for fever, chills, diaphoresis, appetite change and fatigue.  HENT: Negative.   Eyes: Negative.   Respiratory: Negative.  Negative for cough, choking, chest tightness, shortness of breath and stridor.   Cardiovascular: Negative.  Negative for chest pain, palpitations and leg swelling.  Gastrointestinal: Negative.  Negative for abdominal pain.  Endocrine: Negative.   Genitourinary: Negative.   Musculoskeletal: Negative.   Skin: Negative.     Allergic/Immunologic: Negative.   Neurological: Negative.  Negative for dizziness, syncope, weakness and headaches.  Hematological: Negative.  Negative for adenopathy. Does not bruise/bleed easily.  Psychiatric/Behavioral: Negative.     Objective:  BP 116/74 mmHg  Pulse 83  Temp(Src) 98.3 F (36.8 C) (Oral)  Resp 16  Ht _0  (1.626 m)  Wt 174 lb (78.926 kg)  BMI 29.85 kg/m2  SpO2 97%  BP Readings from Last 3 Encounters:  11/05/14 116/74  11/05/14 116/74  10/30/14 102/68    Wt Readings from Last 3 Encounters:  11/05/14 174 lb (78.926 kg)  11/05/14 174 lb (78.926 kg)  10/30/14 174 lb (78.926 kg)    Physical Exam  Constitutional: She is oriented to person, place, and time. No distress.  HENT:  Mouth/Throat: Oropharynx is clear and moist. No oropharyngeal exudate.  Eyes: Conjunctivae are normal. Right eye exhibits no discharge. Left eye exhibits no discharge. No scleral icterus.  Neck: Normal range of motion. Neck supple. No JVD present. No tracheal deviation present. No thyromegaly present.  Cardiovascular: Normal rate, regular rhythm, normal heart sounds and intact distal pulses.  Exam reveals no gallop and no friction rub.   No murmur heard. Pulmonary/Chest: Effort normal and breath sounds normal. No stridor. No respiratory distress. She has no wheezes. She has no rales. She exhibits no tenderness.  Abdominal: Soft. Bowel sounds are normal. She exhibits no distension and no mass. There is no tenderness. There is no rebound and no guarding.  Musculoskeletal: Normal range of motion. She exhibits no edema or tenderness.  Lymphadenopathy:    She has no cervical adenopathy.  Neurological: She is oriented to person, place, and time.  Skin: Skin is warm and dry. No rash noted. She is not diaphoretic. No erythema. No pallor.  Vitals reviewed.   Lab Results  Component Value Date   WBC 9.9 10/24/2014   HGB 13.6 10/24/2014   HCT 39.1 10/24/2014   PLT 207 10/24/2014    GLUCOSE 163* 10/24/2014   CHOL 198 02/12/2014   TRIG 180.0* 02/12/2014   HDL 68.20 02/12/2014   LDLCALC 94 02/12/2014   ALT 33 08/19/2014   AST 21 08/19/2014   NA 128* 10/24/2014   K 3.0* 10/24/2014   CL 91* 10/24/2014   CREATININE 0.62 10/24/2014   BUN 14 10/24/2014   CO2 22 10/24/2014   TSH 0.60 10/23/2014   INR 1.0 02/27/2008   HGBA1C 8.2* 10/23/2014   MICROALBUR 0.7 10/27/2013    No results found.  Assessment & Plan:   Ala was seen today for hypertension.  Diagnoses and all orders for this visit:  Depression with somatization- we'll change to Viibryd her request. Orders: -     Vilazodone HCl 10 & 20 & 40 MG KIT; Take 1 tablet by mouth daily.  Essential hypertension- her blood pressure and pulse are normal, will continue the current meds  for hypertension. Orders: -     metoprolol succinate (TOPROL-XL) 50 MG 24 hr tablet; Take 1 tablet (50 mg total) by mouth daily. Take with or immediately following a meal.   I have discontinued Ms. Howry's sertraline and DULoxetine. I am also having her start on Vilazodone HCl. Additionally, I am having her maintain her aspirin EC, albuterol, glucose blood, Multiple Vitamins-Minerals (MULTIVITAMIN PO), Omega-3 Fatty Acids (FISH OIL PO), tiZANidine, ketoconazole, Insulin Glargine, Insulin Lispro (Human), canagliflozin, primidone, levothyroxine, gabapentin, triamterene-hydrochlorothiazide, guaiFENesin, ULTICARE MICRO PEN NEEDLES, HYDROcodone-acetaminophen, ranitidine, Fluticasone Furoate-Vilanterol, and metoprolol succinate.  Meds ordered this encounter  Medications  . Vilazodone HCl 10 & 20 & 40 MG KIT    Sig: Take 1 tablet by mouth daily.    Dispense:  1 kit    Refill:  0  . metoprolol succinate (TOPROL-XL) 50 MG 24 hr tablet    Sig: Take 1 tablet (50 mg total) by mouth daily. Take with or immediately following a meal.    Dispense:  30 tablet    Refill:  11     Follow-up: Return in about 4 weeks (around 12/03/2014).  Scarlette Calico, MD

## 2014-11-08 ENCOUNTER — Other Ambulatory Visit: Payer: Self-pay | Admitting: Endocrinology

## 2014-11-14 ENCOUNTER — Encounter: Payer: Self-pay | Admitting: Internal Medicine

## 2014-11-15 ENCOUNTER — Telehealth: Payer: Self-pay | Admitting: Internal Medicine

## 2014-11-15 NOTE — Telephone Encounter (Signed)
Ok hydrocodone cough syrup, 200 ml,      5 ml every 8 hours if needed for cough

## 2014-11-15 NOTE — Telephone Encounter (Signed)
atc pt X3, line busy.  Wcb.

## 2014-11-15 NOTE — Telephone Encounter (Signed)
Pt called [facility] requested I call patient back on her mobile number listed in EPIC. Pt was made aware to always call the office number(pt states she has the office number and will do so from now on). Pt wants to get Hydrocodone cough syrup rx from [facility]. Pt is aware that she will need to pick up Rx as we are not able to call Rx in. Pt states she will/can pick up on Wednesday 11-21-14 after her OV with Dr Ronnald Ramp.   Pt states she is having upper airway congestion, trouble breathing-SOB, no wheezing. Cough-productive-light yellow. Denies any fever.    [facility] Please advise. Thanks.

## 2014-11-16 NOTE — Telephone Encounter (Signed)
ATC, Line busy, wcb later

## 2014-11-19 ENCOUNTER — Telehealth: Payer: Self-pay | Admitting: Internal Medicine

## 2014-11-19 MED ORDER — HYDROCODONE-HOMATROPINE 5-1.5 MG/5ML PO SYRP
ORAL_SOLUTION | ORAL | Status: DC
Start: 2014-11-19 — End: 2014-11-21

## 2014-11-19 NOTE — Telephone Encounter (Signed)
Now is requesting med list to be printed out and signed and dated by Dr. Ronnald Ramp to send to pharmacy.

## 2014-11-19 NOTE — Telephone Encounter (Signed)
Rx printed and left at front to be picked up. Patient notified. Nothing further needed.

## 2014-11-19 NOTE — Telephone Encounter (Signed)
Fax sent.

## 2014-11-19 NOTE — Telephone Encounter (Signed)
States she needs in next 15 - 20 minutes.  Also wants to note that their facility does not take samples.

## 2014-11-19 NOTE — Telephone Encounter (Signed)
atc home #, line busy. lmtcb X1 on cell #.

## 2014-11-19 NOTE — Telephone Encounter (Signed)
Pt returned call  Cell: [phone]

## 2014-11-19 NOTE — Telephone Encounter (Signed)
Is requesting current med list to be faxed to [phone]

## 2014-11-20 ENCOUNTER — Telehealth: Payer: Self-pay | Admitting: Internal Medicine

## 2014-11-20 NOTE — Telephone Encounter (Signed)
Steffanie informed associate we needed a copy of pt med list and would correlate the appropriate med list. Associate declined. Last AVS will be sent to show what meds were continued/discontinued.

## 2014-11-20 NOTE — Telephone Encounter (Signed)
[name] from [facility] received pts med list but there is conflicting meds between pt and list Please call her this morning. She only has enough meds for this morning She can be reached at [phone]

## 2014-11-20 NOTE — Telephone Encounter (Signed)
faxed

## 2014-11-21 ENCOUNTER — Ambulatory Visit (INDEPENDENT_AMBULATORY_CARE_PROVIDER_SITE_OTHER): Payer: Commercial Managed Care - HMO | Admitting: Internal Medicine

## 2014-11-21 ENCOUNTER — Encounter: Payer: Self-pay | Admitting: Internal Medicine

## 2014-11-21 VITALS — BP 146/88 | HR 96 | Temp 98.9°F | Resp 16 | Ht 67.0 in | Wt 174.0 lb

## 2014-11-21 DIAGNOSIS — F329 Major depressive disorder, single episode, unspecified: Secondary | ICD-10-CM

## 2014-11-21 DIAGNOSIS — F450 Somatization disorder: Secondary | ICD-10-CM

## 2014-11-21 DIAGNOSIS — Z111 Encounter for screening for respiratory tuberculosis: Secondary | ICD-10-CM

## 2014-11-21 DIAGNOSIS — I10 Essential (primary) hypertension: Secondary | ICD-10-CM | POA: Diagnosis not present

## 2014-11-21 DIAGNOSIS — F32A Depression, unspecified: Secondary | ICD-10-CM

## 2014-11-21 MED ORDER — VILAZODONE HCL 40 MG PO TABS: 40.0000 mg | ORAL_TABLET | Freq: Every day | ORAL | Status: DC

## 2014-11-21 MED ORDER — KLOR-CON M20 20 MEQ PO TBCR: 20.0000 meq | EXTENDED_RELEASE_TABLET | Freq: Once | ORAL | Status: DC

## 2014-11-21 NOTE — Progress Notes (Signed)
Subjective:  Patient ID: [name], female    DOB: 1950-10-03  Age: 64 y.o. MRN: [id number]  CC: Hypertension   HPI [name] presents for for completion of paperwork for her to enter into a personal care home. She also has to have a PPD placed, she tells me the personal care home would not accept a negative blood test for tuberculosis. She has no new or different symptoms today.  Outpatient Prescriptions Prior to Visit  Medication Sig Dispense Refill  . albuterol (PROAIR HFA) 108 (90 BASE) MCG/ACT inhaler Inhale 2 puffs into the lungs every 6 (six) hours as needed for wheezing or shortness of breath. 8.5 g 5  . aspirin EC 81 MG tablet Take 81 mg by mouth every morning.     . canagliflozin (INVOKANA) 300 MG TABS tablet Take 300 mg by mouth daily. 90 tablet 3  . Fluticasone Furoate-Vilanterol (BREO ELLIPTA) 200-25 MCG/INH AEPB Inhale 1 puff into the lungs daily. 2 each 0  . gabapentin (NEURONTIN) 300 MG capsule Take 1 tablet in the morning, 2 at night (Patient taking differently: Take 300-600 mg by mouth 2 (two) times daily. Take 1 tablet in the morning, 2 at night) 270 capsule 1  . glucose blood test strip Use TID  E11.9 100 each 12  . HYDROcodone-acetaminophen (NORCO) 10-325 MG per tablet Take 1 tablet by mouth every 6 (six) hours as needed. (Patient taking differently: Take 1 tablet by mouth every 6 (six) hours as needed for moderate pain. ) 75 tablet 0  . Insulin Glargine (TOUJEO SOLOSTAR) 300 UNIT/ML SOPN Inject 50 Units into the skin daily. 1.5 mL 11  . Insulin Lispro, Human, (HUMALOG KWIKPEN) 200 UNIT/ML SOPN Inject 5 Units into the skin 3 (three) times daily with meals. 9 mL 3  . levothyroxine (SYNTHROID, LEVOTHROID) 75 MCG tablet TAKE ONE TABLET BY MOUTH ONCE DAILY BEFORE BREAKFAST 90 tablet 2  . metoprolol succinate (TOPROL-XL) 50 MG 24 hr tablet Take 1 tablet (50 mg total) by mouth daily. Take with or immediately following a meal. 30 tablet 11  . Multiple  Vitamins-Minerals (MULTIVITAMIN PO) Take 1 tablet by mouth daily.    . Omega-3 Fatty Acids (FISH OIL PO) Take 1 capsule by mouth daily.    . primidone (MYSOLINE) 50 MG tablet TAKE THREE TABLETS BY MOUTH TWICE DAILY (Patient taking differently: TAKE TWO TABLETS BY MOUTH TWICE DAILY) 180 tablet 3  . ranitidine (ZANTAC) 150 MG capsule Take 150 mg by mouth 2 (two) times daily.    Marland Kitchen tiZANidine (ZANAFLEX) 4 MG tablet Take 4 mg by mouth at bedtime as needed for muscle spasms.     Marland Kitchen triamterene-hydrochlorothiazide (DYAZIDE) 37.5-25 MG per capsule Take 1 capsule by mouth daily.  1  . Vilazodone HCl 10 & 20 & 40 MG KIT Take 1 tablet by mouth daily. 1 kit 0  . guaiFENesin (ROBITUSSIN) 100 MG/5ML liquid Take 10 mLs (200 mg total) by mouth 3 (three) times daily as needed for cough. (Patient not taking: Reported on 11/21/2014) 120 mL 2  . HYDROcodone-homatropine (HYCODAN) 5-1.5 MG/5ML syrup Take 54ms every 8 hours as needed 200 mL 0  . ketoconazole (NIZORAL) 2 % cream Apply 1 application topically daily. (Patient not taking: Reported on 11/21/2014) 60 g 2  . RELION PEN NEEDLES 32G X 4 MM MISC USE  TWICE DAILY 50 each 0   No facility-administered medications prior to visit.    ROS Review of Systems  Constitutional: Positive for fatigue. Negative for fever, chills,  diaphoresis and appetite change.  HENT: Negative.   Eyes: Negative.   Respiratory: Negative.  Negative for cough, choking, chest tightness, shortness of breath and stridor.   Cardiovascular: Negative.  Negative for chest pain, palpitations and leg swelling.  Gastrointestinal: Negative.   Endocrine: Negative.   Genitourinary: Negative.   Musculoskeletal: Positive for arthralgias.  Skin: Negative.   Allergic/Immunologic: Negative.   Neurological: Positive for tremors and weakness.  Hematological: Negative.   Psychiatric/Behavioral: Positive for dysphoric mood. Negative for suicidal ideas, hallucinations, sleep disturbance and self-injury. The  patient is nervous/anxious. The patient is not hyperactive.     Objective:  BP 146/88 mmHg  Pulse 96  Temp(Src) 98.9 F (37.2 C) (Oral)  Ht _0  (1.702 m)  Wt 174 lb (78.926 kg)  BMI 27.25 kg/m2  SpO2 97%  BP Readings from Last 3 Encounters:  11/21/14 146/88  11/05/14 116/74  11/05/14 116/74    Wt Readings from Last 3 Encounters:  11/21/14 174 lb (78.926 kg)  11/05/14 174 lb (78.926 kg)  11/05/14 174 lb (78.926 kg)    Physical Exam  Constitutional: She is oriented to person, place, and time. No distress.  HENT:  Mouth/Throat: Oropharynx is clear and moist. No oropharyngeal exudate.  Eyes: Conjunctivae are normal. Right eye exhibits no discharge. Left eye exhibits no discharge. No scleral icterus.  Neck: Normal range of motion. Neck supple. No JVD present. No tracheal deviation present. No thyromegaly present.  Cardiovascular: Normal rate, regular rhythm, normal heart sounds and intact distal pulses.  Exam reveals no gallop and no friction rub.   No murmur heard. Pulmonary/Chest: Effort normal and breath sounds normal. No stridor. No respiratory distress. She has no wheezes. She has no rales. She exhibits no tenderness.  Abdominal: Soft. Bowel sounds are normal. She exhibits no distension and no mass. There is no tenderness. There is no rebound and no guarding.  Musculoskeletal: Normal range of motion. She exhibits no edema or tenderness.  Lymphadenopathy:    She has no cervical adenopathy.  Neurological: She is oriented to person, place, and time.  Skin: Skin is warm and dry. No rash noted. She is not diaphoretic. No erythema. No pallor.    Lab Results  Component Value Date   WBC 9.9 10/24/2014   HGB 13.6 10/24/2014   HCT 39.1 10/24/2014   PLT 207 10/24/2014   GLUCOSE 163* 10/24/2014   CHOL 198 02/12/2014   TRIG 180.0* 02/12/2014   HDL 68.20 02/12/2014   LDLCALC 94 02/12/2014   ALT 33 08/19/2014   AST 21 08/19/2014   NA 128* 10/24/2014   K 3.0* 10/24/2014    CL 91* 10/24/2014   CREATININE 0.62 10/24/2014   BUN 14 10/24/2014   CO2 22 10/24/2014   TSH 0.60 10/23/2014   INR 1.0 02/27/2008   HGBA1C 8.2* 10/23/2014   MICROALBUR 0.7 10/27/2013    No results found.  Assessment & Plan:   [name] was seen today for hypertension.  Diagnoses and all orders for this visit:  Depression with somatization -     Vilazodone HCl (VIIBRYD) 40 MG TABS; Take 1 tablet (40 mg total) by mouth daily.  Visit for TB skin test -     TB Skin Test  Essential hypertension- her blood pressure is adequately well controlled, forms completed, PPD test placed, prescriptions written as requested.  Other orders -     KLOR-CON M20 20 MEQ tablet; Take 1 tablet (20 mEq total) by mouth once.   I have discontinued [name]'s  ketoconazole, guaiFENesin, Vilazodone HCl, RELION PEN NEEDLES, and HYDROcodone-homatropine. I have also changed her KLOR-CON M20. Additionally, I am having her start on Vilazodone HCl. Lastly, I am having her maintain her aspirin EC, albuterol, glucose blood, Multiple Vitamins-Minerals (MULTIVITAMIN PO), Omega-3 Fatty Acids (FISH OIL PO), tiZANidine, Insulin Glargine, Insulin Lispro (Human), canagliflozin, primidone, levothyroxine, gabapentin, triamterene-hydrochlorothiazide, HYDROcodone-acetaminophen, ranitidine, Fluticasone Furoate-Vilanterol, metoprolol succinate, and Cholecalciferol.  Meds ordered this encounter  Medications  . Cholecalciferol (HM VITAMIN D3) 4000 UNITS CAPS    Sig: Take by mouth.  . DISCONTD: KLOR-CON M20 20 MEQ tablet    Sig:   . KLOR-CON M20 20 MEQ tablet    Sig: Take 1 tablet (20 mEq total) by mouth once.    Dispense:  30 tablet    Refill:  11  . Vilazodone HCl (VIIBRYD) 40 MG TABS    Sig: Take 1 tablet (40 mg total) by mouth daily.    Dispense:  30 tablet    Refill:  11     Follow-up: Return in about 6 months (around 05/24/2015).  Scarlette Calico, MD

## 2014-11-21 NOTE — Patient Instructions (Signed)

## 2014-11-21 NOTE — Progress Notes (Signed)
Pre visit review using our clinic review tool, if applicable. No additional management support is needed unless otherwise documented below in the visit note. 

## 2014-11-22 ENCOUNTER — Encounter: Payer: Self-pay | Admitting: Internal Medicine

## 2014-11-22 ENCOUNTER — Ambulatory Visit (INDEPENDENT_AMBULATORY_CARE_PROVIDER_SITE_OTHER): Payer: Commercial Managed Care - HMO | Admitting: Internal Medicine

## 2014-11-22 VITALS — BP 130/82 | HR 93 | Ht 66.0 in | Wt 176.0 lb

## 2014-11-22 DIAGNOSIS — R251 Tremor, unspecified: Secondary | ICD-10-CM | POA: Diagnosis not present

## 2014-11-22 DIAGNOSIS — J302 Other seasonal allergic rhinitis: Secondary | ICD-10-CM

## 2014-11-22 DIAGNOSIS — IMO0001 Reserved for inherently not codable concepts without codable children: Secondary | ICD-10-CM

## 2014-11-22 DIAGNOSIS — B370 Candidal stomatitis: Secondary | ICD-10-CM

## 2014-11-22 DIAGNOSIS — B3781 Candidal esophagitis: Secondary | ICD-10-CM

## 2014-11-22 DIAGNOSIS — G250 Essential tremor: Secondary | ICD-10-CM

## 2014-11-22 DIAGNOSIS — J309 Allergic rhinitis, unspecified: Secondary | ICD-10-CM | POA: Diagnosis not present

## 2014-11-22 DIAGNOSIS — J4520 Mild intermittent asthma, uncomplicated: Secondary | ICD-10-CM

## 2014-11-22 DIAGNOSIS — G252 Other specified forms of tremor: Secondary | ICD-10-CM

## 2014-11-22 DIAGNOSIS — J3089 Other allergic rhinitis: Secondary | ICD-10-CM

## 2014-11-22 MED ORDER — NYSTATIN 100000 UNIT/ML MT SUSP: 5.0000 mL | Freq: Four times a day (QID) | OROMUCOSAL | Status: DC
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
## 2014-11-22 NOTE — Patient Instructions (Signed)
Need to avoid using sprays and aerosols such as insecticide sprays in your room at the Center because these may irritate your asthma.  Added to your medicine list for the [facility]-   Hycodan syrup script   5 ml/ 1 teaspoon every 8 hours for occasional use if needed for cough or pain  Nystatin oral suspension  5 ml/ 1 teaspoon  Swish and swallow every 6- 8 hours for 5 days at a time as needed for thrush

## 2014-11-23 ENCOUNTER — Emergency Department (HOSPITAL_COMMUNITY)
Admission: EM | Admit: 2014-11-23 | Discharge: 2014-11-24 | Disposition: A | Discharge status: Home / Self Care | Payer: Commercial Managed Care - HMO | Attending: Emergency Medicine | Admitting: Emergency Medicine

## 2014-11-23 ENCOUNTER — Encounter (HOSPITAL_COMMUNITY): Payer: Self-pay | Admitting: Emergency Medicine

## 2014-11-23 ENCOUNTER — Telehealth: Payer: Self-pay | Admitting: Internal Medicine

## 2014-11-23 DIAGNOSIS — F419 Anxiety disorder, unspecified: Secondary | ICD-10-CM | POA: Insufficient documentation

## 2014-11-23 DIAGNOSIS — Z79899 Other long term (current) drug therapy: Secondary | ICD-10-CM | POA: Insufficient documentation

## 2014-11-23 DIAGNOSIS — Z8739 Personal history of other diseases of the musculoskeletal system and connective tissue: Secondary | ICD-10-CM | POA: Insufficient documentation

## 2014-11-23 DIAGNOSIS — Z794 Long term (current) use of insulin: Secondary | ICD-10-CM | POA: Diagnosis not present

## 2014-11-23 DIAGNOSIS — G40909 Epilepsy, unspecified, not intractable, without status epilepticus: Secondary | ICD-10-CM | POA: Insufficient documentation

## 2014-11-23 DIAGNOSIS — J45909 Unspecified asthma, uncomplicated: Secondary | ICD-10-CM | POA: Diagnosis not present

## 2014-11-23 DIAGNOSIS — F329 Major depressive disorder, single episode, unspecified: Secondary | ICD-10-CM | POA: Diagnosis not present

## 2014-11-23 DIAGNOSIS — R197 Diarrhea, unspecified: Secondary | ICD-10-CM | POA: Insufficient documentation

## 2014-11-23 DIAGNOSIS — Z7951 Long term (current) use of inhaled steroids: Secondary | ICD-10-CM | POA: Diagnosis not present

## 2014-11-23 DIAGNOSIS — K219 Gastro-esophageal reflux disease without esophagitis: Secondary | ICD-10-CM | POA: Diagnosis not present

## 2014-11-23 DIAGNOSIS — G8929 Other chronic pain: Secondary | ICD-10-CM | POA: Diagnosis not present

## 2014-11-23 DIAGNOSIS — E039 Hypothyroidism, unspecified: Secondary | ICD-10-CM | POA: Insufficient documentation

## 2014-11-23 DIAGNOSIS — I10 Essential (primary) hypertension: Secondary | ICD-10-CM | POA: Insufficient documentation

## 2014-11-23 DIAGNOSIS — Z91040 Latex allergy status: Secondary | ICD-10-CM | POA: Diagnosis not present

## 2014-11-23 DIAGNOSIS — Z87891 Personal history of nicotine dependence: Secondary | ICD-10-CM | POA: Insufficient documentation

## 2014-11-23 DIAGNOSIS — Z7982 Long term (current) use of aspirin: Secondary | ICD-10-CM | POA: Diagnosis not present

## 2014-11-23 DIAGNOSIS — R110 Nausea: Secondary | ICD-10-CM | POA: Insufficient documentation

## 2014-11-23 DIAGNOSIS — B370 Candidal stomatitis: Secondary | ICD-10-CM | POA: Insufficient documentation

## 2014-11-23 DIAGNOSIS — E119 Type 2 diabetes mellitus without complications: Secondary | ICD-10-CM | POA: Diagnosis not present

## 2014-11-23 DIAGNOSIS — B3781 Candidal esophagitis: Secondary | ICD-10-CM

## 2014-11-23 LAB — COMPREHENSIVE METABOLIC PANEL
ALT: 47 U/L (ref 14–54)
AST: 41 U/L (ref 15–41)
Albumin: 4.2 g/dL (ref 3.5–5.0)
Alkaline Phosphatase: 74 U/L (ref 38–126)
Anion gap: 10 (ref 5–15)
BUN: 20 mg/dL (ref 6–20)
CO2: 25 mmol/L (ref 22–32)
Calcium: 9 mg/dL (ref 8.9–10.3)
Chloride: 99 mmol/L — ABNORMAL LOW (ref 101–111)
Creatinine, Ser: 1.07 mg/dL — ABNORMAL HIGH (ref 0.44–1.00)
GFR calc Af Amer: >60 mL/min (ref >60)
GFR calc non Af Amer: 53 mL/min — ABNORMAL LOW (ref >60)
Glucose, Bld: 273 mg/dL — ABNORMAL HIGH (ref 65–99)
Potassium: 3.6 mmol/L (ref 3.5–5.1)
Sodium: 134 mmol/L — ABNORMAL LOW (ref 135–145)
Total Bilirubin: 0.5 mg/dL (ref 0.3–1.2)
Total Protein: 7.6 g/dL (ref 6.5–8.1)

## 2014-11-23 LAB — CBC WITH DIFFERENTIAL/PLATELET
Basophils Absolute: 0 10*3/uL (ref 0.0–0.1)
Basophils Relative: 0 % (ref 0–1)
Eosinophils Absolute: 0.2 10*3/uL (ref 0.0–0.7)
Eosinophils Relative: 3 % (ref 0–5)
HCT: 42.6 % (ref 36.0–46.0)
Hemoglobin: 14.8 g/dL (ref 12.0–15.0)
Lymphocytes Relative: 34 % (ref 12–46)
Lymphs Abs: 3 10*3/uL (ref 0.7–4.0)
MCH: 31.6 pg (ref 26.0–34.0)
MCHC: 34.7 g/dL (ref 30.0–36.0)
MCV: 91 fL (ref 78.0–100.0)
Monocytes Absolute: 0.5 10*3/uL (ref 0.1–1.0)
Monocytes Relative: 5 % (ref 3–12)
Neutro Abs: 5.1 10*3/uL (ref 1.7–7.7)
Neutrophils Relative %: 58 % (ref 43–77)
Platelets: 218 10*3/uL (ref 150–400)
RBC: 4.68 MIL/uL (ref 3.87–5.11)
RDW: 12.9 % (ref 11.5–15.5)
WBC: 8.8 10*3/uL (ref 4.0–10.5)

## 2014-11-23 LAB — TB SKIN TEST
Induration: 0 mm
TB Skin Test: NEGATIVE

## 2014-11-23 MED ORDER — ONDANSETRON 8 MG PO TBDP
8.0000 mg | ORAL_TABLET | Freq: Once | ORAL | Status: AC
  Administered 2014-11-23: 8 mg via ORAL
  Filled 2014-11-23: qty 1

## 2014-11-23 NOTE — Telephone Encounter (Signed)
lmtcb

## 2014-11-23 NOTE — Assessment & Plan Note (Signed)
Not wheezing at this visit. On many occasions but she is having his vocal cord dysfunction and anxiety.

## 2014-11-23 NOTE — ED Notes (Signed)
Patient is aware that we need stool sample for C.Diff. Test. Patient is unable to have bowel movement at this time.

## 2014-11-23 NOTE — Telephone Encounter (Signed)
Patient says that she needs a letter for [facility] stating that the temperature in her room needs to be around 60-68 degrees due to her Asthma.  She says that the temperature is fine up until 3am but after 3am, it gets above 75 degrees and it gets so hot in her room that she cannot breath.   CY - Please advise.

## 2014-11-23 NOTE — ED Notes (Signed)
Patient resides at [facility]. Complaining of diarrhea since yesterday. Reports 7 episodes of loose stool since lunch time yesterday. Also complaining of nausea, denies vomiting today. Reports was vomiting yesterday. Denies abdominal pain.

## 2014-11-23 NOTE — Assessment & Plan Note (Signed)
Variable postnasal drip Plan-Claritin

## 2014-11-23 NOTE — Assessment & Plan Note (Signed)
Obvious today on exam. Managed by neurology.

## 2014-11-24 MED ORDER — ONDANSETRON 8 MG PO TBDP: 8.0000 mg | ORAL_TABLET | Freq: Three times a day (TID) | ORAL | Status: DC | PRN

## 2014-11-24 NOTE — Discharge Instructions (Signed)
Diarrhea Diarrhea is frequent loose and watery bowel movements. It can cause you to feel weak and dehydrated. Dehydration can cause you to become tired and thirsty, have a dry mouth, and have decreased urination that often is dark yellow. Diarrhea is a sign of another problem, most often an infection that will not last long. In most cases, diarrhea typically lasts 2-3 days. However, it can last longer if it is a sign of something more serious. It is important to treat your diarrhea as directed by your caregiver to lessen or prevent future episodes of diarrhea. CAUSES  Some common causes include:  Gastrointestinal infections caused by viruses, bacteria, or parasites.  Food poisoning or food allergies.  Certain medicines, such as antibiotics, chemotherapy, and laxatives.  Artificial sweeteners and fructose.  Digestive disorders. HOME CARE INSTRUCTIONS  Ensure adequate fluid intake (hydration): Have 1 cup (8 oz) of fluid for each diarrhea episode. Avoid fluids that contain simple sugars or sports drinks, fruit juices, whole milk products, and sodas. Your urine should be clear or pale yellow if you are drinking enough fluids. Hydrate with an oral rehydration solution that you can purchase at pharmacies, retail stores, and online. You can prepare an oral rehydration solution at home by mixing the following ingredients together:   - tsp table salt.   tsp baking soda.   tsp salt substitute containing potassium chloride.  1  tablespoons sugar.  1 L (34 oz) of water.  Certain foods and beverages may increase the speed at which food moves through the gastrointestinal (GI) tract. These foods and beverages should be avoided and include:  Caffeinated and alcoholic beverages.  High-fiber foods, such as raw fruits and vegetables, nuts, seeds, and whole grain breads and cereals.  Foods and beverages sweetened with sugar alcohols, such as xylitol, sorbitol, and mannitol.  Some foods may be well  tolerated and may help thicken stool including:  Starchy foods, such as rice, toast, pasta, low-sugar cereal, oatmeal, grits, baked potatoes, crackers, and bagels.  Bananas.  Applesauce.  Add probiotic-rich foods to help increase healthy bacteria in the GI tract, such as yogurt and fermented milk products.  Wash your hands well after each diarrhea episode.  Only take over-the-counter or prescription medicines as directed by your caregiver.  Take a warm bath to relieve any burning or pain from frequent diarrhea episodes. SEEK IMMEDIATE MEDICAL CARE IF:   You are unable to keep fluids down.  You have persistent vomiting.  You have blood in your stool, or your stools are black and tarry.  You do not urinate in 6-8 hours, or there is only a small amount of very dark urine.  You have abdominal pain that increases or localizes.  You have weakness, dizziness, confusion, or light-headedness.  You have a severe headache.  Your diarrhea gets worse or does not get better.  You have a fever or persistent symptoms for more than 2-3 days.  You have a fever and your symptoms suddenly get worse. MAKE SURE YOU:   Understand these instructions.  Will watch your condition.  Will get help right away if you are not doing well or get worse. Document Released: 03/13/2002 Document Revised: 08/07/2013 Document Reviewed: 11/29/2011 [facility] Patient Information 2015 [place], [place]. This information is not intended to replace advice given to you by your health care provider. Make sure you discuss any questions you have with your health care provider.   If your diarrhea persists, you should provide a stool sample as discussed and have been  given a kit to do this.  Return this to the hospitals lab.  You may want to maintain a bland diet for the next day or until your symptoms are better.

## 2014-11-25 NOTE — ED Provider Notes (Signed)
CSN: [id number]     Arrival date & time 11/23/14  2010 History   First MD Initiated Contact with Patient 11/23/14 2133     Chief Complaint  Patient presents with  . Diarrhea     (Consider location/radiation/quality/duration/timing/severity/associated sxs/prior Treatment) The history is provided by the patient and the spouse.   [name] is a 64 y.o. female presenting with a 24 hour history of nausea with emesis x 1 yesterday and 7 episodes of brown, nonbloody liquid diarrheal stools since.  Her symptoms started shortly after eating at a local restaurant yesterday for lunch.  Her husband at the same food and denies symptoms.  She denies fevers, weakness and abdominal pain except for cramping prior to having bowel movements. Her last episode of diarrhea occurred just prior to arrival here.  She has taken no medicines for her symptoms.  She does endorse taking amoxicillin within the past 30 days for bronchitis.    Past Medical History  Diagnosis Date  . 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 [name], residual palpitations  . Vocal cord dysfunction   . Tremor, essential     takes Mysoline and Neurontin daily.  . Allergic rhinitis     uses Nasonex daily as needed  . Low sodium syndrome   . Chronic back pain     reason unknown  . Lumbar radiculopathy   . DDD (degenerative disc disease), lumbar   . Esophageal reflux     takes Zantac daily  . Hypertension     takes Losartan daily  . Anxiety     takes Ativan daily as needed  . Hypothyroidism     takes Synthroid daily  . Depression     takes Zoloft daily as well as Cymbalta  . Diabetes mellitus     takes NOvolin daily  . Asthma     Albuterol inhaler and Neb daily as needed  . History of bronchitis Dec 2014  . Seizures 04-05-13    one time ran out of Primidone and had stopped it cold [place]  . Weakness     in left arm  . Joint pain   . Joint swelling     left ankle  . Dysphagia    . Constipation   . Allergy to angiotensin receptor blockers (ARB) 01/22/2014    Angioedema  . 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 Rayann Heman 2007   Past Surgical History  Procedure Laterality Date  . Tonsillectomy    . Right breast cyst      benign  . Left ankle ligament repair      x 2  . Left elbow repair    . Partial hysterectomy    . Cholecystectomy    . Cesarean section    . Ablation for avnrt    . Colonoscopy  01/16/2004    [facility]:ERDEYC rectum/colon  . Esophagogastroduodenoscopy (egd) with esophageal dilation  04/03/2002    XKG:YJEHUDJS'H ring, otherwise normal esophagus, status post dilation with 54 F/Normal stomach  . Bartholin gland cyst excision      x 2  . Esophagogastroduodenoscopy (egd) with esophageal dilation N/A 11/08/2012    Procedure: ESOPHAGOGASTRODUODENOSCOPY (EGD) WITH ESOPHAGEAL DILATION;  Surgeon: Daneil Dolin, MD;  [facility];  Service: Endoscopy;  Laterality: N/A;  11:30  . Subthalamic stimulator insertion Bilateral 12/04/2013    Procedure: Bilateral Deep brain stimulator placement;  Surgeon: [name], MD;  Location: [place] [facility];  Service: Neurosurgery;  Laterality: Bilateral;  Bilateral Deep brain stimulator placement  . Pulse generator implant Bilateral 12/15/2013    Procedure: BILATERAL PULSE GENERATOR IMPLANT;  Surgeon: [name], MD;  Location: [place] [facility] OR[facility];  Service: Neurosurgery;  Laterality: Bilateral;  BILATERAL  . Maximum access (mas)posterior lumbar interbody fusion (plif) 1 level N/A 04/24/2014    Procedure: Lumbar four-five Maximum access Surgery posterior lumbar interbody fusion;  Surgeon: [name], MD;  Location: [place] [facility]UR[facility] ORS;  Service: Neurosurgery;  Laterality: N/A;  Lumbar four-five Maximum access Surgery posterior lumbar interbody fusion   Family History  Problem Relation Age of Onset  . Lung cancer Father     DIED AGE 61 LUNG CA  . Alcohol abuse Father   . Anxiety disorder Father   . Depression Father   .  COPD Mother     DIED AGE 47,MRSA,COPD,PNEUMONIA  . Pneumonia Mother     DIED AGE 47,MRSA,COPD,PNEUMONIA  . Supraventricular tachycardia Mother   . Anxiety disorder Mother   . Depression Mother   . Alcohol abuse Mother   . COPD Brother     AGE 36  . Heart disease Sister     DIED AGE 6, SMOKER,?HEART  . Colon cancer Neg Hx    Social History  Substance Use Topics  . Smoking status: Former Smoker -- 0.30 packs/day for 10 years    Types: Cigarettes    Quit date: 04/23/1993  . Smokeless tobacco: Never Used     Comment: quit smoking 25+yrs ago  . Alcohol Use: No     Comment: no alcohol in 11yrs   OB History    No data available     Review of Systems  Constitutional: Negative for fever and chills.  HENT: Negative for congestion and sore throat.   Eyes: Negative.   Respiratory: Negative for chest tightness and shortness of breath.   Cardiovascular: Negative for chest pain.  Gastrointestinal: Positive for nausea and diarrhea. Negative for vomiting and abdominal pain.  Genitourinary: Negative.   Musculoskeletal: Negative for joint swelling, arthralgias and neck pain.  Skin: Negative.  Negative for rash and wound.  Neurological: Negative for dizziness, weakness, light-headedness, numbness and headaches.  Psychiatric/Behavioral: Negative.       Allergies  Losartan; Metformin and related; Latex; Morphine and related; Other; and Oxycodone-acetaminophen  Home Medications   Prior to Admission medications   Medication Sig Start Date End Date Taking? Authorizing Provider  albuterol (PROAIR HFA) 108 (90 BASE) MCG/ACT inhaler Inhale 2 puffs into the lungs every 6 (six) hours as needed for wheezing or shortness of breath. 08/03/13   Tammy S Parrett, NP  aspirin EC 81 MG tablet Take 81 mg by mouth every morning.     Historical Provider, MD  canagliflozin (INVOKANA) 300 MG TABS tablet Take 300 mg by mouth daily. 07/02/14   Janith Lima, MD  Cholecalciferol (HM VITAMIN D3) 4000 UNITS  CAPS Take by mouth.    Historical Provider, MD  Fluticasone Furoate-Vilanterol (BREO ELLIPTA) 200-25 MCG/INH AEPB Inhale 1 puff into the lungs daily. 11/01/14   Deneise Lever, MD  gabapentin (NEURONTIN) 300 MG capsule Take 1 tablet in the morning, 2 at night Patient taking differently: Take 300-600 mg by mouth 2 (two) times daily. Take 1 tablet in the morning, 2 at night 08/29/14   Eustace Quail Tat, DO  glucose blood test strip Use TID  E11.9 04/10/14   Biagio Borg, MD  HYDROcodone-acetaminophen [facility]) 10-325 MG per tablet Take 1 tablet by mouth  every 6 (six) hours as needed. Patient taking differently: Take 1 tablet by mouth every 6 (six) hours as needed for moderate pain.  10/23/14   Janith Lima, MD  HYDROcodone-homatropine [facility]) 5-1.5 MG/5ML syrup Take 5 mLs by mouth every 6 (six) hours as needed for cough.    Historical Provider, MD  Insulin Glargine (TOUJEO SOLOSTAR) 300 UNIT/ML SOPN Inject 50 Units into the skin daily. 07/02/14   Janith Lima, MD  Insulin Lispro, Human, (HUMALOG KWIKPEN) 200 UNIT/ML SOPN Inject 5 Units into the skin 3 (three) times daily with meals. 07/02/14   Janith Lima, MD  KLOR-CON M20 20 MEQ tablet Take 1 tablet (20 mEq total) by mouth once. 11/21/14   Janith Lima, MD  levothyroxine (SYNTHROID, LEVOTHROID) 75 MCG tablet TAKE ONE TABLET BY MOUTH ONCE DAILY BEFORE BREAKFAST 08/29/14   Janith Lima, MD  metoprolol succinate (TOPROL-XL) 50 MG 24 hr tablet Take 1 tablet (50 mg total) by mouth daily. Take with or immediately following a meal. 11/06/14   Janith Lima, MD  Multiple Vitamins-Minerals (MULTIVITAMIN PO) Take 1 tablet by mouth daily.    Historical Provider, MD  nystatin (MYCOSTATIN) 100000 UNIT/ML suspension Take 5 mLs (500,000 Units total) by mouth 4 (four) times daily. 11/22/14   Deneise Lever, MD  Omega-3 Fatty Acids (FISH OIL PO) Take 1 capsule by mouth daily.    Historical Provider, MD  ondansetron (ZOFRAN ODT) 8 MG disintegrating tablet Take 1 tablet  (8 mg total) by mouth every 8 (eight) hours as needed for nausea or vomiting. 11/24/14   Evalee Jefferson, PA-C  primidone (MYSOLINE) 50 MG tablet TAKE THREE TABLETS BY MOUTH TWICE DAILY Patient taking differently: TAKE TWO TABLETS BY MOUTH TWICE DAILY 07/30/14   Janith Lima, MD  ranitidine (ZANTAC) 150 MG capsule Take 150 mg by mouth 2 (two) times daily.    Historical Provider, MD  tiZANidine (ZANAFLEX) 4 MG tablet Take 4 mg by mouth at bedtime as needed for muscle spasms.     Historical Provider, MD  triamterene-hydrochlorothiazide (DYAZIDE) 37.5-25 MG per capsule Take 1 capsule by mouth daily. 08/30/14   Historical Provider, MD  Vilazodone HCl (VIIBRYD) 40 MG TABS Take 1 tablet (40 mg total) by mouth daily. 11/21/14   Janith Lima, MD   BP 135/73 mmHg  Pulse 90  Temp(Src) 98.2 F (36.8 C) (Oral)  Resp 20  Ht 5\' 4"  (1.626 m)  Wt 176 lb (79.833 kg)  BMI 30.20 kg/m2  SpO2 98% Physical Exam  Constitutional: She appears well-developed and well-nourished. No distress.  Talkative, appears in no distress.  HENT:  Head: Normocephalic and atraumatic.  Eyes: Conjunctivae are normal.  Neck: Normal range of motion.  Cardiovascular: Normal rate, regular rhythm, normal heart sounds and intact distal pulses.   Pulmonary/Chest: Effort normal and breath sounds normal. She has no wheezes.  Abdominal: Soft. Bowel sounds are normal. She exhibits no distension. There is no tenderness. There is no guarding.  Musculoskeletal: Normal range of motion.  Neurological: She is alert.  Skin: Skin is warm and dry.  Psychiatric: She has a normal mood and affect.  Nursing note and vitals reviewed.   ED Course  Procedures (including critical care time) Labs Review Labs Reviewed  COMPREHENSIVE METABOLIC PANEL - Abnormal; Notable for the following:    Sodium 134 (*)    Chloride 99 (*)    Glucose, Bld 273 (*)    Creatinine, Ser 1.07 (*)    GFR calc  non Af Amer 53 (*)    All other components within normal limits   CBC WITH DIFFERENTIAL/PLATELET    Imaging Review No results found. I have personally reviewed and evaluated these images and lab results as part of my medical decision-making.   EKG Interpretation None      MDM   Final diagnoses:  Diarrhea    Labs reviewed.  Pt was unable to provide stool sample for cultures/c dif. during her 5 hour stay here.  Suspect sx are from viral source.  She was encouraged bland diet, increased fluid intake,  May use imodium or pepto bismol if sx return.  Also encouraged to provide stool sample (order pending) to our lab if her sx persist.  Pt understands plan.  No abdominal discomfort, non acute abd exam prior to dc home. Pt appears stable for dc to her home (assisted living).    The patient appears reasonably screened and/or stabilized for discharge and I doubt any other medical condition or other [facility] requiring further screening, evaluation, or treatment in the ED at this time prior to discharge.     Evalee Jefferson, PA-C 11/25/14 1426  [name], MD 11/26/14 1059

## 2014-11-26 ENCOUNTER — Emergency Department (HOSPITAL_COMMUNITY): Payer: Commercial Managed Care - HMO

## 2014-11-26 ENCOUNTER — Encounter (HOSPITAL_COMMUNITY): Payer: Self-pay

## 2014-11-26 ENCOUNTER — Telehealth: Payer: Self-pay | Admitting: Internal Medicine

## 2014-11-26 ENCOUNTER — Emergency Department (HOSPITAL_COMMUNITY)
Admission: EM | Admit: 2014-11-26 | Discharge: 2014-11-26 | Disposition: A | Discharge status: Home / Self Care | Payer: Commercial Managed Care - HMO | Attending: Emergency Medicine | Admitting: Emergency Medicine

## 2014-11-26 ENCOUNTER — Ambulatory Visit: Payer: Commercial Managed Care - HMO | Admitting: Family Medicine

## 2014-11-26 DIAGNOSIS — E039 Hypothyroidism, unspecified: Secondary | ICD-10-CM | POA: Diagnosis not present

## 2014-11-26 DIAGNOSIS — K5900 Constipation, unspecified: Secondary | ICD-10-CM | POA: Diagnosis not present

## 2014-11-26 DIAGNOSIS — K219 Gastro-esophageal reflux disease without esophagitis: Secondary | ICD-10-CM | POA: Insufficient documentation

## 2014-11-26 DIAGNOSIS — Z7982 Long term (current) use of aspirin: Secondary | ICD-10-CM | POA: Insufficient documentation

## 2014-11-26 DIAGNOSIS — Z79899 Other long term (current) drug therapy: Secondary | ICD-10-CM | POA: Diagnosis not present

## 2014-11-26 DIAGNOSIS — G40909 Epilepsy, unspecified, not intractable, without status epilepticus: Secondary | ICD-10-CM | POA: Insufficient documentation

## 2014-11-26 DIAGNOSIS — I10 Essential (primary) hypertension: Secondary | ICD-10-CM | POA: Insufficient documentation

## 2014-11-26 DIAGNOSIS — J45901 Unspecified asthma with (acute) exacerbation: Secondary | ICD-10-CM | POA: Diagnosis not present

## 2014-11-26 DIAGNOSIS — Z91040 Latex allergy status: Secondary | ICD-10-CM | POA: Insufficient documentation

## 2014-11-26 DIAGNOSIS — E119 Type 2 diabetes mellitus without complications: Secondary | ICD-10-CM | POA: Diagnosis not present

## 2014-11-26 DIAGNOSIS — F419 Anxiety disorder, unspecified: Secondary | ICD-10-CM | POA: Insufficient documentation

## 2014-11-26 DIAGNOSIS — J45909 Unspecified asthma, uncomplicated: Secondary | ICD-10-CM | POA: Diagnosis present

## 2014-11-26 DIAGNOSIS — Z794 Long term (current) use of insulin: Secondary | ICD-10-CM | POA: Insufficient documentation

## 2014-11-26 DIAGNOSIS — F329 Major depressive disorder, single episode, unspecified: Secondary | ICD-10-CM | POA: Diagnosis not present

## 2014-11-26 DIAGNOSIS — Z87891 Personal history of nicotine dependence: Secondary | ICD-10-CM | POA: Insufficient documentation

## 2014-11-26 DIAGNOSIS — G8929 Other chronic pain: Secondary | ICD-10-CM | POA: Diagnosis not present

## 2014-11-26 LAB — CBC WITH DIFFERENTIAL/PLATELET
Basophils Absolute: 0 10*3/uL (ref 0.0–0.1)
Basophils Relative: 0 % (ref 0–1)
Eosinophils Absolute: 0.2 10*3/uL (ref 0.0–0.7)
Eosinophils Relative: 2 % (ref 0–5)
HCT: 40.3 % (ref 36.0–46.0)
Hemoglobin: 13.8 g/dL (ref 12.0–15.0)
Lymphocytes Relative: 36 % (ref 12–46)
Lymphs Abs: 3 10*3/uL (ref 0.7–4.0)
MCH: 31.3 pg (ref 26.0–34.0)
MCHC: 34.2 g/dL (ref 30.0–36.0)
MCV: 91.4 fL (ref 78.0–100.0)
Monocytes Absolute: 0.5 10*3/uL (ref 0.1–1.0)
Monocytes Relative: 6 % (ref 3–12)
Neutro Abs: 4.7 10*3/uL (ref 1.7–7.7)
Neutrophils Relative %: 56 % (ref 43–77)
Platelets: 193 10*3/uL (ref 150–400)
RBC: 4.41 MIL/uL (ref 3.87–5.11)
RDW: 12.8 % (ref 11.5–15.5)
WBC: 8.3 10*3/uL (ref 4.0–10.5)

## 2014-11-26 LAB — URINALYSIS, ROUTINE W REFLEX MICROSCOPIC
Bilirubin Urine: NEGATIVE
Glucose, UA: >1000 mg/dL — AB
Hgb urine dipstick: NEGATIVE
Ketones, ur: NEGATIVE mg/dL
Leukocytes, UA: NEGATIVE
Nitrite: NEGATIVE
Protein, ur: NEGATIVE mg/dL
Specific Gravity, Urine: <1.005 — ABNORMAL LOW (ref 1.005–1.030)
Urobilinogen, UA: 0.2 mg/dL (ref 0.0–1.0)
pH: 5.5 (ref 5.0–8.0)

## 2014-11-26 LAB — URINE MICROSCOPIC-ADD ON

## 2014-11-26 LAB — COMPREHENSIVE METABOLIC PANEL
ALT: 28 U/L (ref 14–54)
AST: 20 U/L (ref 15–41)
Albumin: 3.9 g/dL (ref 3.5–5.0)
Alkaline Phosphatase: 63 U/L (ref 38–126)
Anion gap: 8 (ref 5–15)
BUN: 12 mg/dL (ref 6–20)
CO2: 27 mmol/L (ref 22–32)
Calcium: 8.8 mg/dL — ABNORMAL LOW (ref 8.9–10.3)
Chloride: 101 mmol/L (ref 101–111)
Creatinine, Ser: 0.64 mg/dL (ref 0.44–1.00)
GFR calc Af Amer: >60 mL/min (ref >60)
GFR calc non Af Amer: >60 mL/min (ref >60)
Glucose, Bld: 147 mg/dL — ABNORMAL HIGH (ref 65–99)
Potassium: 3.6 mmol/L (ref 3.5–5.1)
Sodium: 136 mmol/L (ref 135–145)
Total Bilirubin: 0.4 mg/dL (ref 0.3–1.2)
Total Protein: 7 g/dL (ref 6.5–8.1)

## 2014-11-26 LAB — TROPONIN I
Troponin I: <0.03 ng/mL (ref <0.031)
Troponin I: <0.03 ng/mL (ref <0.031)

## 2014-11-26 LAB — LIPASE, BLOOD: Lipase: 19 U/L — ABNORMAL LOW (ref 22–51)

## 2014-11-26 MED ORDER — PREDNISONE 50 MG PO TABS: ORAL_TABLET | ORAL | Status: DC

## 2014-11-26 MED ORDER — ALBUTEROL SULFATE HFA 108 (90 BASE) MCG/ACT IN AERS: 2.0000 | INHALATION_SPRAY | Freq: Four times a day (QID) | RESPIRATORY_TRACT | Status: DC | PRN

## 2014-11-26 MED ORDER — ALBUTEROL SULFATE (2.5 MG/3ML) 0.083% IN NEBU
5.0000 mg | INHALATION_SOLUTION | Freq: Once | RESPIRATORY_TRACT | Status: AC
  Administered 2014-11-26: 5 mg via RESPIRATORY_TRACT
  Filled 2014-11-26: qty 6

## 2014-11-26 MED ORDER — PREDNISONE 10 MG PO TABS
60.0000 mg | ORAL_TABLET | Freq: Once | ORAL | Status: DC
  Filled 2014-11-26 (×2): qty 1

## 2014-11-26 NOTE — ED Notes (Signed)
Patient unable to ambulate. Patient states that she does not ambulate at home normally. States uses assistance and a walker.

## 2014-11-26 NOTE — ED Provider Notes (Signed)
CSN: [id number]     Arrival date & time 11/26/14  1047 History  This chart was scribed for Elizabeth Essex, MD by Erling Conte, ED Scribe. This patient was seen in room APA19/APA19 and the patient's care was started at 11:57 AM.    Chief Complaint  Patient presents with  . Asthma    The history is provided by the patient. No language interpreter was used.    HPI Comments: [name] is a 64 y.o. female with a h/o asthma, SVT and DM who presents to the Emergency Department complaining of intermittent, moderate, wheezing onset this morning. Pt reports associated SOB, rhinorrhea, and cough productive of yellow sputum. Pt notes she began to develop "sharp", centralized chest pain 30 minutes ago. She states she has not had this chest pain in the past but reports the pain is slowly resolving. She denies any cardiac h/o MIs or stent placements. Pt notes that she is having some mild constipation as well. She states she saw her pulmonologist last Wednesday for her cough symptoms and was diagnosed with thrush and given Nystatin. She states she was previously taking amoxicillin which she was compliant with. She reports at home she used her Breo inhaler with no relief. She was also given 2 puffs of her albuterol inhaler at her assisted living home with mild relief. She was last hospitalized for breathing troubles 3-4 years ago. She is former smoker. She denies any fever, chills, or abdominal pain   Past Medical History  Diagnosis Date  . 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 [name], residual palpitations  . Vocal cord dysfunction   . Tremor, essential     takes Mysoline and Neurontin daily.  . Allergic rhinitis     uses Nasonex daily as needed  . Low sodium syndrome   . Chronic back pain     reason unknown  . Lumbar radiculopathy   . DDD (degenerative disc disease), lumbar   . Esophageal reflux     takes Zantac daily  . Hypertension     takes  Losartan daily  . Anxiety     takes Ativan daily as needed  . Hypothyroidism     takes Synthroid daily  . Depression     takes Zoloft daily as well as Cymbalta  . Diabetes mellitus     takes NOvolin daily  . Asthma     Albuterol inhaler and Neb daily as needed  . History of bronchitis Dec 2014  . Seizures 04-05-13    one time ran out of Primidone and had stopped it cold [place]  . Weakness     in left arm  . Joint pain   . Joint swelling     left ankle  . Dysphagia   . Constipation   . Allergy to angiotensin receptor blockers (ARB) 01/22/2014    Angioedema  . 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 Rayann Heman 2007   Past Surgical History  Procedure Laterality Date  . Tonsillectomy    . Right breast cyst      benign  . Left ankle ligament repair      x 2  . Left elbow repair    . Partial hysterectomy    . Cholecystectomy    . Cesarean section    . Ablation for avnrt    . Colonoscopy  01/16/2004    SPQ:ZRAQTM rectum/colon  . Esophagogastroduodenoscopy (egd) with esophageal dilation  04/03/2002  JJO:[id number] ring, otherwise normal esophagus, status post dilation with 54 F/Normal stomach  . Bartholin gland cyst excision      x 2  . Esophagogastroduodenoscopy (egd) with esophageal dilation N/A 11/08/2012    Procedure: ESOPHAGOGASTRODUODENOSCOPY (EGD) WITH ESOPHAGEAL DILATION;  Surgeon: Daneil Dolin, MD;  Location[facility];  Service: Endoscopy;  Laterality: N/A;  11:30  . Subthalamic stimulator insertion Bilateral 12/04/2013    Procedure: Bilateral Deep brain stimulator placement;  Surgeon: [name], MD;  Location: [place] [facility];  Service: Neurosurgery;  Laterality: Bilateral;  Bilateral Deep brain stimulator placement  . Pulse generator implant Bilateral 12/15/2013    Procedure: BILATERAL PULSE GENERATOR IMPLANT;  Surgeon: [name], MD;  Location: [place] [facility];  Service: Neurosurgery;  Laterality: Bilateral;  BILATERAL  . Maximum access (mas)posterior lumbar  interbody fusion (plif) 1 level N/A 04/24/2014    Procedure: Lumbar four-five Maximum access Surgery posterior lumbar interbody fusion;  Surgeon: [name], MD;  Location: [place] [facility];  Service: Neurosurgery;  Laterality: N/A;  Lumbar four-five Maximum access Surgery posterior lumbar interbody fusion   Family History  Problem Relation Age of Onset  . Lung cancer Father     DIED AGE 28 LUNG CA  . Alcohol abuse Father   . Anxiety disorder Father   . Depression Father   . COPD Mother     DIED AGE 39,MRSA,COPD,PNEUMONIA  . Pneumonia Mother     DIED AGE 39,MRSA,COPD,PNEUMONIA  . Supraventricular tachycardia Mother   . Anxiety disorder Mother   . Depression Mother   . Alcohol abuse Mother   . COPD Brother     AGE 10  . Heart disease Sister     DIED AGE 45, SMOKER,?HEART  . Colon cancer Neg Hx    Social History  Substance Use Topics  . Smoking status: Former Smoker -- 0.30 packs/day for 10 years    Types: Cigarettes    Quit date: 04/23/1993  . Smokeless tobacco: Never Used     Comment: quit smoking 25+yrs ago  . Alcohol Use: No     Comment: no alcohol in 66yrs   OB History    No data available     Review of Systems  Constitutional: Negative for fever and chills.  Respiratory: Positive for cough, shortness of breath and wheezing.   Gastrointestinal: Positive for constipation. Negative for abdominal pain.  A complete 10 system review of systems was obtained and all systems are negative except as noted in the HPI and PMH.      Allergies  Losartan; Metformin and related; Latex; Morphine and related; Other; and Oxycodone-acetaminophen  Home Medications   Prior to Admission medications   Medication Sig Start Date End Date Taking? Authorizing Provider  aspirin EC 81 MG tablet Take 81 mg by mouth every morning.    Yes Historical Provider, MD  canagliflozin (INVOKANA) 300 MG TABS tablet Take 300 mg by mouth daily. 07/02/14  Yes Janith Lima, MD  Fluticasone  Furoate-Vilanterol (BREO ELLIPTA) 200-25 MCG/INH AEPB Inhale 1 puff into the lungs daily. 11/01/14  Yes Deneise Lever, MD  gabapentin (NEURONTIN) 300 MG capsule Take 1 tablet in the morning, 2 at night Patient taking differently: Take 300-600 mg by mouth 2 (two) times daily. Take 1 tablet in the morning, 2 at night 08/29/14  Yes Rebecca S Tat, DO  HYDROcodone-acetaminophen (NORCO) 10-325 MG per tablet Take 1 tablet by mouth every 6 (six) hours as needed. Patient taking differently: Take 1 tablet by mouth every  6 (six) hours as needed for moderate pain.  10/23/14  Yes Janith Lima, MD  Insulin Glargine (TOUJEO SOLOSTAR) 300 UNIT/ML SOPN Inject 50 Units into the skin daily. 07/02/14  Yes Janith Lima, MD  Insulin Lispro, Human, (HUMALOG KWIKPEN) 200 UNIT/ML SOPN Inject 5 Units into the skin 3 (three) times daily with meals. 07/02/14  Yes Janith Lima, MD  levothyroxine (SYNTHROID, LEVOTHROID) 75 MCG tablet TAKE ONE TABLET BY MOUTH ONCE DAILY BEFORE BREAKFAST 08/29/14  Yes Janith Lima, MD  metoprolol succinate (TOPROL-XL) 50 MG 24 hr tablet Take 1 tablet (50 mg total) by mouth daily. Take with or immediately following a meal. 11/06/14  Yes Janith Lima, MD  Multiple Vitamins-Minerals (MULTIVITAMIN PO) Take 1 tablet by mouth daily.   Yes Historical Provider, MD  nystatin (MYCOSTATIN) 100000 UNIT/ML suspension Take 5 mLs (500,000 Units total) by mouth 4 (four) times daily. 11/22/14  Yes Deneise Lever, MD  Omega-3 Fatty Acids (FISH OIL PO) Take 1 capsule by mouth daily.   Yes Historical Provider, MD  potassium chloride (K-DUR,KLOR-CON) 10 MEQ tablet Place 10 mEq onto the skin daily.   Yes Historical Provider, MD  primidone (MYSOLINE) 50 MG tablet TAKE THREE TABLETS BY MOUTH TWICE DAILY 07/30/14  Yes Janith Lima, MD  ranitidine (ZANTAC) 150 MG capsule Take 150 mg by mouth 2 (two) times daily.   Yes Historical Provider, MD  sertraline (ZOLOFT) 100 MG tablet Take 100 mg by mouth daily.   Yes Historical  Provider, MD  triamterene-hydrochlorothiazide (DYAZIDE) 37.5-25 MG per capsule Take 1 capsule by mouth daily. 08/30/14  Yes Historical Provider, MD  Vilazodone HCl (VIIBRYD) 40 MG TABS Take 1 tablet (40 mg total) by mouth daily. 11/21/14  Yes Janith Lima, MD  albuterol (PROVENTIL HFA;VENTOLIN HFA) 108 (90 BASE) MCG/ACT inhaler Inhale 2 puffs into the lungs every 6 (six) hours as needed for wheezing or shortness of breath. 11/26/14   Elizabeth Essex, MD  glucose blood test strip Use TID  E11.9 04/10/14   Biagio Borg, MD  HYDROcodone-homatropine [facility]) 5-1.5 MG/5ML syrup Take 5 mLs by mouth every 6 (six) hours as needed for cough.    Historical Provider, MD  KLOR-CON M20 20 MEQ tablet Take 1 tablet (20 mEq total) by mouth once. Patient not taking: Reported on 11/26/2014 11/21/14   Janith Lima, MD  ondansetron (ZOFRAN ODT) 8 MG disintegrating tablet Take 1 tablet (8 mg total) by mouth every 8 (eight) hours as needed for nausea or vomiting. Patient not taking: Reported on 11/26/2014 11/24/14   Evalee Jefferson, PA-C  predniSONE (DELTASONE) 50 MG tablet 1 tablet PO daily 11/26/14   Elizabeth Essex, MD  tiZANidine (ZANAFLEX) 4 MG tablet Take 4 mg by mouth at bedtime as needed for muscle spasms.     Historical Provider, MD   Triage Vitals: BP 111/78 mmHg  Pulse 81  Temp(Src) 98.4 F (36.9 C) (Oral)  Resp 18  Ht 5' 4.5" (1.638 m)  Wt 176 lb (79.833 kg)  BMI 29.75 kg/m2  SpO2 98%  Physical Exam  Constitutional: She is oriented to person, place, and time. She appears well-developed and well-nourished. No distress.  HENT:  Head: Normocephalic and atraumatic.  Mouth/Throat: Oropharynx is clear and moist. No oropharyngeal exudate.  Hoarse voice  Eyes: Conjunctivae and EOM are normal. Pupils are equal, round, and reactive to light.  Neck: Normal range of motion. Neck supple.  No meningismus.  Cardiovascular: Normal rate, regular rhythm, normal heart sounds  and intact distal pulses.   No murmur  heard. No leg swelling  Pulmonary/Chest: Effort normal and breath sounds normal. No respiratory distress. She has no wheezes.  Lower sternal tenderness reproducible to palpation Some forced upper airway wheeze  Abdominal: Soft. There is no tenderness. There is no rebound and no guarding.  Musculoskeletal: Normal range of motion. She exhibits no edema or tenderness.  Splint R wrist  Neurological: She is alert and oriented to person, place, and time. No cranial nerve deficit. She exhibits normal muscle tone. Coordination normal.  No ataxia on finger to nose bilaterally. No pronator drift. 5/5 strength throughout. CN 2-12 intact.Equal grip strength. Sensation intact. Essential tremor   Skin: Skin is warm.  Psychiatric: She has a normal mood and affect. Her behavior is normal.  Nursing note and vitals reviewed.   ED Course  Procedures (including critical care time)  DIAGNOSTIC STUDIES: Oxygen Saturation is 98% on RA, normal by my interpretation.    COORDINATION OF CARE: 12:23 PM- Will order CXR and x-ray of neck soft tissue. Will also order diagnostic lab work and EKG.  Pt advised of plan for treatment and pt agrees.     Labs Review Labs Reviewed  COMPREHENSIVE METABOLIC PANEL - Abnormal; Notable for the following:    Glucose, Bld 147 (*)    Calcium 8.8 (*)    All other components within normal limits  LIPASE, BLOOD - Abnormal; Notable for the following:    Lipase 19 (*)    All other components within normal limits  URINALYSIS, ROUTINE W REFLEX MICROSCOPIC (NOT AT [facility]) - Abnormal; Notable for the following:    Specific Gravity, Urine <1.005 (*)    Glucose, UA >1000 (*)    All other components within normal limits  CBC WITH DIFFERENTIAL/PLATELET  TROPONIN I  TROPONIN I  URINE MICROSCOPIC-ADD ON    Imaging Review Dg Neck Soft Tissue  11/26/2014   CLINICAL DATA:  Intermittent moderate wheezing since this morning. History of vocal cord dysfunction.  EXAM: NECK SOFT TISSUES -  1+ VIEW  COMPARISON:  CT 09/28/2014  FINDINGS: Again noted are bilateral deep brain stimulator devices. Alignment of cervical spine is within normal limits. Again noted is disc space narrowing at C4-C7. The prevertebral soft tissues are within normal limits. Normal appearance of the epiglottis. Bilateral facet arthropathy in the cervical spine. Upper lungs are clear.  IMPRESSION: No acute abnormality.  Cervical spine degenerative disease.   Electronically Signed   By: Markus Daft M.D.   On: 11/26/2014 13:03   Dg Chest 2 View  11/26/2014   CLINICAL DATA:  Wheezing since this morning. Rhinorrhea, productive cough  EXAM: CHEST  2 VIEW  COMPARISON:  09/10/2014  FINDINGS: The heart size and mediastinal contours are within normal limits. Both lungs are clear. The visualized skeletal structures are unremarkable. Power packs noted along the anterior chest wall bilaterally.  IMPRESSION: No active cardiopulmonary disease.   Electronically Signed   By: Kathreen Devoid   On: 11/26/2014 13:01   I have personally reviewed and evaluated these images and lab results as part of my medical decision-making.   EKG Interpretation   Date/Time:  Monday November 26 2014 11:52:54 EDT Ventricular Rate:  84 PR Interval:  165 QRS Duration: 91 QT Interval:  390 QTC Calculation: 461 R Axis:   61 Text Interpretation:  Sinus rhythm Consider left atrial enlargement No  significant change was found Confirmed by Wyvonnia Dusky  MD, Steffen Hase [phone]) on  11/26/2014 12:22:56 PM  MDM   Final diagnoses:  Asthma attack   wheezing, shortness of breath and cough since this morning. Feels better now after receiving nebulizer. 30 minutes ago developed central chest pain that has improved. Saw pulmonologist 3 days ago and was diagnosed with thrush.  Lungs are clear. She is not wheezing. She has chronic hoarseness. 99% on room air. Chest pain is reproducible. EKG is unchanged.  Chest x-ray negative. Troponin negative. Lungs are clear without  wheezing.  We'll treat for asthma exacerbation. Doubt PE, doubt ACS. Chest pain is reproducible and worse with palpation. Troponin is negative 2. She is in no respiratory distress, no hypoxia, no tachycardia.  Patient knows to monitor blood sugars closely while taking steroids. Return precautions discussed.  I personally performed the services described in this documentation, which was scribed in my presence. The recorded information has been reviewed and is accurate.    Elizabeth Essex, MD 11/26/14 2300

## 2014-11-26 NOTE — ED Notes (Signed)
Complain of asthma flare up. Pt talking non stop . No respiratory distress noted. Pt states she used her albuterol and her fluticasone this morning

## 2014-11-26 NOTE — Telephone Encounter (Signed)
Dr. Annamaria Boots, please advise if okay to send letter.

## 2014-11-26 NOTE — ED Notes (Addendum)
Patient started complaining of chest pain. EKG done and given to Dr. Eulis Foster.

## 2014-11-26 NOTE — Discharge Instructions (Signed)
Asthma Watch your blood sugars while you are on the steroids. Followup with your doctor. Return to the ED if you develop new or worsening symptoms Asthma is a recurring condition in which the airways tighten and narrow. Asthma can make it difficult to breathe. It can cause coughing, wheezing, and shortness of breath. Asthma episodes, also called asthma attacks, range from minor to life-threatening. Asthma cannot be cured, but medicines and lifestyle changes can help control it. CAUSES Asthma is believed to be caused by inherited (genetic) and environmental factors, but its exact cause is unknown. Asthma may be triggered by allergens, lung infections, or irritants in the air. Asthma triggers are different for each person. Common triggers include:   Animal dander.  Dust mites.  Cockroaches.  Pollen from trees or grass.  Mold.  Smoke.  Air pollutants such as dust, household cleaners, hair sprays, aerosol sprays, paint fumes, strong chemicals, or strong odors.  Cold air, weather changes, and winds (which increase molds and pollens in the air).  Strong emotional expressions such as crying or laughing hard.  Stress.  Certain medicines (such as aspirin) or types of drugs (such as beta-blockers).  Sulfites in foods and drinks. Foods and drinks that may contain sulfites include dried fruit, potato chips, and sparkling grape juice.  Infections or inflammatory conditions such as the flu, a cold, or an inflammation of the nasal membranes (rhinitis).  Gastroesophageal reflux disease (GERD).  Exercise or strenuous activity. SYMPTOMS Symptoms may occur immediately after asthma is triggered or many hours later. Symptoms include:  Wheezing.  Excessive nighttime or early morning coughing.  Frequent or severe coughing with a common cold.  Chest tightness.  Shortness of breath. DIAGNOSIS  The diagnosis of asthma is made by a review of your medical history and a physical exam. Tests may  also be performed. These may include:  Lung function studies. These tests show how much air you breathe in and out.  Allergy tests.  Imaging tests such as X-rays. TREATMENT  Asthma cannot be cured, but it can usually be controlled. Treatment involves identifying and avoiding your asthma triggers. It also involves medicines. There are 2 classes of medicine used for asthma treatment:   Controller medicines. These prevent asthma symptoms from occurring. They are usually taken every day.  Reliever or rescue medicines. These quickly relieve asthma symptoms. They are used as needed and provide short-term relief. Your health care provider will help you create an asthma action plan. An asthma action plan is a written plan for managing and treating your asthma attacks. It includes a list of your asthma triggers and how they may be avoided. It also includes information on when medicines should be taken and when their dosage should be changed. An action plan may also involve the use of a device called a peak flow meter. A peak flow meter measures how well the lungs are working. It helps you monitor your condition. HOME CARE INSTRUCTIONS   Take medicines only as directed by your health care provider. Speak with your health care provider if you have questions about how or when to take the medicines.  Use a peak flow meter as directed by your health care provider. Record and keep track of readings.  Understand and use the action plan to help minimize or stop an asthma attack without needing to seek medical care.  Control your home environment in the following ways to help prevent asthma attacks:  Do not smoke. Avoid being exposed to secondhand smoke.  Change  your heating and air conditioning filter regularly.  Limit your use of fireplaces and wood stoves.  Get rid of pests (such as roaches and mice) and their droppings.  Throw away plants if you see mold on them.  Clean your floors and dust  regularly. Use unscented cleaning products.  Try to have someone else vacuum for you regularly. Stay out of rooms while they are being vacuumed and for a short while afterward. If you vacuum, use a dust mask from a hardware store, a double-layered or microfilter vacuum cleaner bag, or a vacuum cleaner with a HEPA filter.  Replace carpet with wood, tile, or vinyl flooring. Carpet can trap dander and dust.  Use allergy-proof pillows, mattress covers, and box spring covers.  Wash bed sheets and blankets every week in hot water and dry them in a dryer.  Use blankets that are made of polyester or cotton.  Clean bathrooms and kitchens with bleach. If possible, have someone repaint the walls in these rooms with mold-resistant paint. Keep out of the rooms that are being cleaned and painted.  Wash hands frequently. SEEK MEDICAL CARE IF:   You have wheezing, shortness of breath, or a cough even if taking medicine to prevent attacks.  The colored mucus you cough up (sputum) is thicker than usual.  Your sputum changes from clear or white to yellow, green, gray, or bloody.  You have any problems that may be related to the medicines you are taking (such as a rash, itching, swelling, or trouble breathing).  You are using a reliever medicine more than 2-3 times per week.  Your peak flow is still at 50-79% of your personal best after following your action plan for 1 hour.  You have a fever. SEEK IMMEDIATE MEDICAL CARE IF:   You seem to be getting worse and are unresponsive to treatment during an asthma attack.  You are short of breath even at rest.  You get short of breath when doing very little physical activity.  You have difficulty eating, drinking, or talking due to asthma symptoms.  You develop chest pain.  You develop a fast heartbeat.  You have a bluish color to your lips or fingernails.  You are light-headed, dizzy, or faint.  Your peak flow is less than 50% of your personal  best. MAKE SURE YOU:   Understand these instructions.  Will watch your condition.  Will get help right away if you are not doing well or get worse. Document Released: 03/23/2005 Document Revised: 08/07/2013 Document Reviewed: 10/20/2012 [facility] Patient Information 2015 [place], [place]. This information is not intended to replace advice given to you by your health care provider. Make sure you discuss any questions you have with your health care provider.

## 2014-11-26 NOTE — Telephone Encounter (Signed)
Called spoke with pt. Pt was seen in ED today for asthma attack. She reports she was told she needed to f/u this week with Dr. Annamaria Boots. She wants an ASAP this week bc she is still having problems with her breathing. Please advise Dr. Annamaria Boots thanks

## 2014-11-26 NOTE — Telephone Encounter (Signed)
Slot open CY on 8.24.16 at 1100, ok per CY.   Called and spoke to pt. Informed her of the appt time and date. Pt verbalized understanding and denied any further questions or concerns at this time.

## 2014-11-27 ENCOUNTER — Telehealth: Payer: Self-pay

## 2014-11-27 ENCOUNTER — Telehealth: Payer: Self-pay | Admitting: Internal Medicine

## 2014-11-27 ENCOUNTER — Ambulatory Visit: Payer: Self-pay | Admitting: Family Medicine

## 2014-11-27 NOTE — Telephone Encounter (Signed)
Calling to inform us pt blood suagr is 505 as of noon. Was given 5 units of humalog at noon. Instructed by MD to give [place] a call back with another reading in thirty minutes to an hour

## 2014-11-27 NOTE — Telephone Encounter (Signed)
Per Dr. Ronnald Ramp verbal order was instructed to call [facility] and have nurse give pt 5 units of humalog. I spoke to [place] at [facility] at 2:01PM to inform she will call back will another blood sugar reading in an hour

## 2014-11-27 NOTE — Telephone Encounter (Signed)
Called to get additional reading pt blood sugar is now 462 as of 1:00 P.M.

## 2014-11-27 NOTE — Telephone Encounter (Signed)
Patient is requesting a referral to [facility]. Dr. Amedeo Plenty. She was seeing dr [name] before, but wishes to switch

## 2014-11-27 NOTE — Telephone Encounter (Signed)
Spoke with rebecca at [facility]. Reading at 3:00 was 417 and reading at 4:00PM was 367. Pt will receive another 5 units of humalog shortly due to meal time

## 2014-11-27 NOTE — Telephone Encounter (Signed)
Marzetta Board called back about the reading, but didn't give it to me. Can you call for Wells Guiles at [phone]

## 2014-11-28 ENCOUNTER — Encounter: Payer: Self-pay | Admitting: Internal Medicine

## 2014-11-28 ENCOUNTER — Encounter: Payer: Self-pay | Admitting: Emergency Medicine

## 2014-11-28 ENCOUNTER — Encounter: Payer: Self-pay | Admitting: *Deleted

## 2014-11-28 ENCOUNTER — Ambulatory Visit (INDEPENDENT_AMBULATORY_CARE_PROVIDER_SITE_OTHER): Payer: Commercial Managed Care - HMO | Admitting: Internal Medicine

## 2014-11-28 VITALS — BP 144/96 | HR 91 | Ht 64.5 in | Wt 174.0 lb

## 2014-11-28 DIAGNOSIS — K219 Gastro-esophageal reflux disease without esophagitis: Secondary | ICD-10-CM | POA: Diagnosis not present

## 2014-11-28 DIAGNOSIS — B370 Candidal stomatitis: Secondary | ICD-10-CM | POA: Diagnosis not present

## 2014-11-28 DIAGNOSIS — J4520 Mild intermittent asthma, uncomplicated: Secondary | ICD-10-CM

## 2014-11-28 DIAGNOSIS — IMO0001 Reserved for inherently not codable concepts without codable children: Secondary | ICD-10-CM

## 2014-11-28 DIAGNOSIS — B3781 Candidal esophagitis: Secondary | ICD-10-CM | POA: Diagnosis not present

## 2014-11-28 MED ORDER — ALBUTEROL SULFATE (2.5 MG/3ML) 0.083% IN NEBU: 2.5000 mg | INHALATION_SOLUTION | Freq: Four times a day (QID) | RESPIRATORY_TRACT | Status: DC | PRN

## 2014-11-28 MED ORDER — LEVALBUTEROL HCL 0.63 MG/3ML IN NEBU
0.6300 mg | INHALATION_SOLUTION | Freq: Once | RESPIRATORY_TRACT | Status: AC
  Administered 2014-11-28: 0.63 mg via RESPIRATORY_TRACT

## 2014-11-28 MED ORDER — PROMETHAZINE-CODEINE 6.25-10 MG/5ML PO SYRP: 5.0000 mL | ORAL_SOLUTION | Freq: Four times a day (QID) | ORAL | Status: DC | PRN

## 2014-11-28 NOTE — Telephone Encounter (Signed)
If you can print up letter for me to sign, i appreciate it.

## 2014-11-28 NOTE — Telephone Encounter (Signed)
Patient was in ED for Ashtma attack Patient scheduled to see Dr. Annamaria Boots today at 11am Patient requested letter on 8/19 ...  To Dr. Annamaria Boots to address at [facility]

## 2014-11-28 NOTE — Assessment & Plan Note (Signed)
She had chest x-ray and soft tissue imaging of neck Avoid triggers as previously indicated Reflux control Keep room temperature in her care unit at 68 or below, which she indicates is her comfort range to avoid asthma Refill albuterol nebulizer solution for her home machine with instruction to home care that she can self administer.
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
## 2014-11-28 NOTE — Patient Instructions (Addendum)
I have written on your Care sheet orders that you can be allowed to use your own nebulizer as often as every 4-6 hours yourself as needed for asthma  I suggest you ask Dr Ronnald Ramp for referral to GI to help with your esophageal reflux and LPR   Script sent for albuterol nebulizer solution to [place]  We can keep your currernt appointment    Neb xop 0.63  Script for prometh codeine cough syrup

## 2014-11-28 NOTE — Assessment & Plan Note (Signed)
Says she coughs after meals, not while eating or swallowing. Known history of GERD. Asks referral to GI and I suggested she speak with Dr. Ronnald Ramp about this when she sees him for scheduled visit next week. We have felt that reflux was a significant component of her episodic wheezy cough

## 2014-11-28 NOTE — Assessment & Plan Note (Signed)
Responded to nystatin after last visit

## 2014-11-29 ENCOUNTER — Ambulatory Visit (INDEPENDENT_AMBULATORY_CARE_PROVIDER_SITE_OTHER): Payer: Commercial Managed Care - HMO | Admitting: Neurology

## 2014-11-29 ENCOUNTER — Encounter: Payer: Self-pay | Admitting: Neurology

## 2014-11-29 VITALS — BP 104/68 | HR 84 | Ht 64.5 in | Wt 175.0 lb

## 2014-11-29 DIAGNOSIS — E1342 Other specified diabetes mellitus with diabetic polyneuropathy: Secondary | ICD-10-CM

## 2014-11-29 DIAGNOSIS — Z9689 Presence of other specified functional implants: Secondary | ICD-10-CM

## 2014-11-29 DIAGNOSIS — G250 Essential tremor: Secondary | ICD-10-CM

## 2014-11-29 DIAGNOSIS — Z9889 Other specified postprocedural states: Secondary | ICD-10-CM | POA: Diagnosis not present

## 2014-11-29 DIAGNOSIS — W19XXXD Unspecified fall, subsequent encounter: Secondary | ICD-10-CM

## 2014-11-29 DIAGNOSIS — E1142 Type 2 diabetes mellitus with diabetic polyneuropathy: Secondary | ICD-10-CM

## 2014-11-29 DIAGNOSIS — G629 Polyneuropathy, unspecified: Secondary | ICD-10-CM

## 2014-11-29 NOTE — Procedures (Signed)
DBS Programming was performed.    Total time spent programming was 40 minutes.  Device was confirmed to be on.  Soft start was confirmed to be on.  Impedences were checked and were within normal limits.  Battery was checked and was determined to be functioning normally and not near the end of life.  Final settings were as follows:  Left brain electrode:     1-2+           ; Amplitude  2.5   V   ; Pulse width 90 microseconds;   Frequency   130   Hz.  Right brain electrode:     3-2+          ; Amplitude   3.4  V ;  Pulse width 90  microseconds;  Frequency   130    Hz.

## 2014-11-29 NOTE — Progress Notes (Signed)
Subjective:   [name] was seen in f/u today.  DBS surgery to the bilateral VIM done 12/04/13 with bilateral generator placement on 12/15/13.  She has had several falls.  With several of these, she picked up something and fell when bending over.  The L leg is also giving way and seems a bit weak.  She states that there is some numbness in the top of the left leg as well.  There has been some back pain as well.  With each fall, the left leg ends up underneath her.  Tremor has been better.  Speech stable.   Admits that she has been scratching at the battery area and it has been red because of it.    01/10/14 update:  Overall, pt has been doing well.  She had another fall since last visit.   After she got the walker, she hasn't fallen since.   Is very happy with the DBS surgery.   Pt states that she can carry a cup with open lids now.  Has back pain and L thigh pain.  Had EMG today.  D/W Dr. Posey Pronto.  Acute radiculopathy, primarily of L4.  03/08/14 update:  The patient is seen back in follow-up, accompanied by her husband who supplements the history.  Records since last visit were reviewed.  The patient was seen in the emergency room on 01/21/2014 secondary to angioedema, which was presumed secondary to her lisinopril which was discontinued.  She was seen back in the emergency room on 01/29/2014 with an asthma attack and the next day she was seen there with high blood sugars due to the prednisone that was given for her asthma attack.  She was again seen in the emergency room on 02/19/2014 with chest pain, that was ultimately felt musculoskeletal secondary to coughing.  In regards to tremor, the patient states that she has overall been doing well.  She is happy with results of surgery.  She does note some facial tremor.  Pt does report that she had an injection in her back since last visit, done by Dr. Vertell Limber.  She states that her back is doing much better but now the same knee is giving out.  She fell Saturday  as wasn't using her cane or walker and her knee gave out.   08/29/14 update:  The patient is seen today in follow-up, accompanied by her husband who supplements the history.  Records were reviewed since last visit.  She has a history of essential tremor and last visit I decreased her primidone from 150 mg twice a day to 100 mg twice a day.  She has done well in that regard.  Right hand tremor is well controlled but she still has some tremor in the left hand.  She has more trouble clipping nails with the left hand.  Admits that tremor is better when she has BS under better controlled and is trying to work on that.  States that she has lost 21 lbs doing this.  Prior to that admits that she wasn't controlling BS with diet.  Since our last visit, she had back surgery for an L4 radiculopathy.  This was done by Dr. Vertell Limber on 04/24/2014.  Unfortunately, she continues to have falls, but the back pain that was present prior to surgery went away.  Dr. Vertell Limber felt perhaps the falls were caused by knee pain and she was to follow-up with Dr. Percell Miller.  She saw him about a month ago.  She had injection in  the L knee and a brace was put on it and it seemed somewhat helpful, but now the right knee is giving way.  She does think that the falls are from the knee.  She has been in the emergency room several times with complaints of leg pain, but she states that this is not the same leg pain as she had prior to surgery.  This time, the pain is in both legs and involves the whole legs.  Records suggested this was in the hips distally but she states that it is below the knees primarily and it is a stabbing pain. Dr. Vertell Limber felt that it was diabetic neuropathy.  She was in the emergency room on both February 6 and February 29 complaining about this pain.  She is also in the emergency room on May 16, but this was for chest and abdominal pain and it was relieved with hydrocodone and a GI cocktail.  She has been in physical therapy and finished  2 weeks ago.  She uses the walker most of the time but uses the wheelchair when out.    11/29/14 update:  The patient is following up today, accompanied by her husband who supplements the history.  I reviewed records since last visit.  She has been to the emergency room several times.  She was there on June 6 with an asthma attack and has been balancing her blood sugars because of the prednisone given for asthma.  She was in the emergency room after a fall on June 24.  She did not sustain any fractures in that fall and was released after workup from the emergency room.  She states that she did f/u with Dr. Charlann Boxer and was told that she had a scaphoid fx and now has an appt with Dr. Amedeo Plenty.    She was back in the emergency room on July 20 with tachycardia and lower extremity edema, but again was released from the emergency room.  Overall, patient reports her tremor has been about the same.  Last visit, I did increase her gabapentin because of diabetic peripheral neuropathy so that she was on 300 mg in the morning and 600 mg at night.  She thinks that helped, but asks me if she can go up on it a little further because of her back pain.  She states that she was using Vicodin, but the Vicodin caused her to fall and they will not let her have it during the daytime anymore.  She did move to assisted living with her husband since last visit.  She just moved last week.  She is a little homesick.  She states that when she move to assisted living, they went completely by the directions of her medications on her bottle.  Even though I had decreased her primidone in the past to 100 mg twice a day, they are now distributing to her at 150 mg twice a day again and she asked me to decrease it.   Allergies  Allergen Reactions  . Losartan     Angioedema   . Metformin And Related Diarrhea  . Latex Other (See Comments)    Other Reaction: redness, blisters  . Morphine And Related Other (See Comments)    Pt. States med  makes her crazy  . Other Other (See Comments)    Other Reaction: redness, blisters  . Oxycodone-Acetaminophen Itching    TYLOX. Caused internal itching per patient     Outpatient Encounter Prescriptions as of 11/29/2014  Medication  Sig  . albuterol (PROVENTIL HFA;VENTOLIN HFA) 108 (90 BASE) MCG/ACT inhaler Inhale 2 puffs into the lungs every 6 (six) hours as needed for wheezing or shortness of breath.  Marland Kitchen albuterol (PROVENTIL) (2.5 MG/3ML) 0.083% nebulizer solution Take 3 mLs (2.5 mg total) by nebulization every 6 (six) hours as needed for wheezing or shortness of breath.  Marland Kitchen aspirin EC 81 MG tablet Take 81 mg by mouth every morning.   . canagliflozin (INVOKANA) 300 MG TABS tablet Take 300 mg by mouth daily.  . Fluticasone Furoate-Vilanterol (BREO ELLIPTA) 200-25 MCG/INH AEPB Inhale 1 puff into the lungs daily.  Marland Kitchen gabapentin (NEURONTIN) 300 MG capsule Take 1 tablet in the morning, 2 at night (Patient taking differently: Take 300-600 mg by mouth 2 (two) times daily. Take 1 tablet in the morning, 2 at night)  . HYDROcodone-acetaminophen (NORCO) 10-325 MG per tablet Take 1 tablet by mouth every 6 (six) hours as needed. (Patient taking differently: Take 1 tablet by mouth every 6 (six) hours as needed for moderate pain. )  . Insulin Glargine (TOUJEO SOLOSTAR) 300 UNIT/ML SOPN Inject 50 Units into the skin daily.  . Insulin Lispro, Human, (HUMALOG KWIKPEN) 200 UNIT/ML SOPN Inject 5 Units into the skin 3 (three) times daily with meals.  Marland Kitchen ketoconazole (NIZORAL) 2 % cream Apply 1 application topically daily.  Marland Kitchen KLOR-CON M20 20 MEQ tablet Take 1 tablet (20 mEq total) by mouth once.  Marland Kitchen levothyroxine (SYNTHROID, LEVOTHROID) 75 MCG tablet TAKE ONE TABLET BY MOUTH ONCE DAILY BEFORE BREAKFAST  . metoprolol succinate (TOPROL-XL) 50 MG 24 hr tablet Take 1 tablet (50 mg total) by mouth daily. Take with or immediately following a meal.  . Multiple Vitamins-Minerals (MULTIVITAMIN PO) Take 1 tablet by mouth daily.    Marland Kitchen nystatin cream (MYCOSTATIN) Apply 1 application topically 2 (two) times daily.  . Omega-3 Fatty Acids (FISH OIL PO) Take 1 capsule by mouth daily.  . ondansetron (ZOFRAN ODT) 8 MG disintegrating tablet Take 1 tablet (8 mg total) by mouth every 8 (eight) hours as needed for nausea or vomiting.  . predniSONE (DELTASONE) 50 MG tablet 1 tablet PO daily  . primidone (MYSOLINE) 50 MG tablet TAKE THREE TABLETS BY MOUTH TWICE DAILY  . promethazine-codeine (PHENERGAN WITH CODEINE) 6.25-10 MG/5ML syrup Take 5 mLs by mouth every 6 (six) hours as needed for cough.  . ranitidine (ZANTAC) 150 MG capsule Take 150 mg by mouth 2 (two) times daily.  . sertraline (ZOLOFT) 100 MG tablet Take 100 mg by mouth daily.  Marland Kitchen tiZANidine (ZANAFLEX) 4 MG tablet Take 4 mg by mouth at bedtime as needed for muscle spasms.   Marland Kitchen triamterene-hydrochlorothiazide (DYAZIDE) 37.5-25 MG per capsule Take 1 capsule by mouth daily.  . Vilazodone HCl (VIIBRYD) 40 MG TABS Take 1 tablet (40 mg total) by mouth daily.  Marland Kitchen glucose blood test strip Use TID  E11.9 (Patient not taking: Reported on 11/29/2014)   No facility-administered encounter medications on file as of 11/29/2014.    Past Medical History  Diagnosis Date  . 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 [name], residual palpitations  . Vocal cord dysfunction   . Tremor, essential     takes Mysoline and Neurontin daily.  . Allergic rhinitis     uses Nasonex daily as needed  . Low sodium syndrome   . Chronic back pain     reason unknown  . Lumbar radiculopathy   . DDD (degenerative disc disease), lumbar   .  Esophageal reflux     takes Zantac daily  . Hypertension     takes Losartan daily  . Anxiety     takes Ativan daily as needed  . Hypothyroidism     takes Synthroid daily  . Depression     takes Zoloft daily as well as Cymbalta  . Diabetes mellitus     takes NOvolin daily  . Asthma     Albuterol inhaler and Neb daily as  needed  . History of bronchitis Dec 2014  . Seizures 04-05-13    one time ran out of Primidone and had stopped it cold [place]  . Weakness     in left arm  . Joint pain   . Joint swelling     left ankle  . Dysphagia   . Constipation   . Allergy to angiotensin receptor blockers (ARB) 01/22/2014    Angioedema  . 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 Rayann Heman 2007    Past Surgical History  Procedure Laterality Date  . Tonsillectomy    . Right breast cyst      benign  . Left ankle ligament repair      x 2  . Left elbow repair    . Partial hysterectomy    . Cholecystectomy    . Cesarean section    . Ablation for avnrt    . Colonoscopy  01/16/2004    XVQ:MGQQPY rectum/colon  . Esophagogastroduodenoscopy (egd) with esophageal dilation  04/03/2002    PPJ:KDTOIZTI'W ring, otherwise normal esophagus, status post dilation with 54 F/Normal stomach  . Bartholin gland cyst excision      x 2  . Esophagogastroduodenoscopy (egd) with esophageal dilation N/A 11/08/2012    Procedure: ESOPHAGOGASTRODUODENOSCOPY (EGD) WITH ESOPHAGEAL DILATION;  Surgeon: Daneil Dolin, MD;  Location: [facility];  Service: Endoscopy;  Laterality: N/A;  11:30  . Subthalamic stimulator insertion Bilateral 12/04/2013    Procedure: Bilateral Deep brain stimulator placement;  Surgeon: [name], MD;  Location: [facility];  Service: Neurosurgery;  Laterality: Bilateral;  Bilateral Deep brain stimulator placement  . Pulse generator implant Bilateral 12/15/2013    Procedure: BILATERAL PULSE GENERATOR IMPLANT;  Surgeon: [name], MD;  Location: [facility];  Service: Neurosurgery;  Laterality: Bilateral;  BILATERAL  . Maximum access (mas)posterior lumbar interbody fusion (plif) 1 level N/A 04/24/2014    Procedure: Lumbar four-five Maximum access Surgery posterior lumbar interbody fusion;  Surgeon: [name], MD;  Location: [facility];  Service: Neurosurgery;  Laterality: N/A;  Lumbar four-five Maximum  access Surgery posterior lumbar interbody fusion    Social History   Social History  . Marital Status: Married    Spouse Name: N/A  . Number of Children: N/A  . Years of Education: N/A   Occupational History  . retired    Social History Main Topics  . Smoking status: Former Smoker -- 0.30 packs/day for 10 years    Types: Cigarettes    Quit date: 04/23/1993  . Smokeless tobacco: Never Used     Comment: quit smoking 25+yrs ago  . Alcohol Use: No     Comment: no alcohol in 61yrs  . Drug Use: No  . Sexual Activity: No   Other Topics Concern  . Not on file   Social History Narrative   Married, 1 daughte, living.  Lives with husband in [place], [place].   1 child died brain tumor at age 39.   Two grandchildren.   Works at [employer]  supervisor, patient currently lost her job.   Retired 2011.   No tobacco.   Alcohol: none in 30 yrs.  Distant history of heavy use.   No drug use.    Family Status  Relation Status Death Age  . Father Deceased     lung cancer, diabetes  . Mother Deceased     COPD, diabetes  . Son Deceased 7    brain tumor (neuroblastoma)  . Sister Deceased     blood clot  . Daughter Alive     healthy    Review of Systems A complete 10 system ROS was obtained and was negative apart from what is mentioned.   Objective:   VITALS:   Filed Vitals:   11/29/14 1102  BP: 104/68  Pulse: 84  Height: 5' 4.5" (1.638 m)  Weight: 175 lb (79.379 kg)   Gen: Appears stated age and in NAD.  HEENT: Normocephalic.  Scalp incisions are well healed.  The mucous membranes are moist. The superficial temporal arteries are without ropiness or tenderness.  Cardiovascular: Regular rate and rhythm.  Lungs: Clear to auscultation bilaterally.  Neck: There are no carotid bruits noted bilaterally.  Skin: incisions are all well healed now  NEUROLOGICAL:  Orientation: The patient is alert and oriented x 3.  Cranial nerves: There is good facial symmetry.  Speech is fluent and  clear. There is a vocal tremor and just slight pseudobulbar quality to the voice. Soft palate rises symmetrically and there is no tongue deviation. Hearing is intact to conversational tone.  Tone: Tone is good throughout.  Sensation: Sensation is intact to light touch throughout. Coordination: The patient has no dysdiadichokinesia or dysmetria.  Motor: Strength is at least antigravity x 4 Gait and Station: The patient is ambulating with a Rollator today and does well with that.   MOVEMENT EXAM:  Tremor: There is no tremor of the outstretched hands.  She has very little intention tremor on the left, and some of that may be a bit ataxic.  She does have difficulty with Archimedes spirals on the left.  .There is head tremor at rest that is complex head titubation.   DBS programming was performed today which is described in more detail in a separate programming procedural notes.  In brief, there was improvement following programming    Assessment/Plan:   1.  Essential Tremor.  -This is evidenced by the symmetrical nature and longstanding hx of gradually getting worse.  -The patient is status post bilateral VIM DBS on 12/04/2013 with bilateral generator placement on 12/15/2013.  Postoperative imaging indicates that leads are perhaps just slightly too lateral and more inferior than anticipated.  -DBS device was programmed again today.  Neither side turned up high but R sided tremor well controlled.  Programming has been more challenging on the R brain (L body) due to lead position and SE with programming of dyskinesia and face pull.  Major programming changes made today on the right brain, which hopefully will result in increased efficacy.  Patient looked well when she left today.    -Decrease Mysoline again to 100 mg bid 2.  Diabetic peripheral neuropathy  -neurontin increase - 600 mg in the AM and 600 at night.  Risks, benefits, side effects and alternative therapies were discussed.  The opportunity  to ask questions was given and they were answered to the best of my ability.  The patient expressed understanding and willingness to follow the outlined treatment protocols. 3.  L4 radiculopathy  -S/P surgery  and back pain better but still falling because of knee pain and now falling because of Vicodin.  Agree with other providers that she should not be taking Vicodin during the day.  Happy to see that she is now at assisted living. 4.  F/u with me in next 3 months, sooner should new neurologic issues arise.  Much greater than 50% of this visit was spent in counseling with the patient and the family.  Total face to face time:  30 min (independent of programming time

## 2014-11-30 NOTE — Telephone Encounter (Signed)
Mcarthur Rossetti Josem Kaufmann #[id number] valid 11/30/2014 - 05/29/2015 for 6 visits. Pt is aware she can call and make her appt.

## 2014-12-03 ENCOUNTER — Telehealth: Payer: Self-pay | Admitting: Internal Medicine

## 2014-12-03 NOTE — Telephone Encounter (Signed)
Called pt at # provided above.  Spoke with [name].  Was advised pt is not currently in the building.  [name] states she will let pt know we called when she returns.

## 2014-12-04 ENCOUNTER — Ambulatory Visit: Payer: Commercial Managed Care - HMO | Admitting: Family Medicine

## 2014-12-04 MED ORDER — FEXOFENADINE HCL 180 MG PO TABS: 180.0000 mg | ORAL_TABLET | Freq: Every day | ORAL | Status: DC

## 2014-12-04 NOTE — Telephone Encounter (Signed)
Called and spoke with charge nurse from [facility] long term care Advised that per CY it was ok to stop pt's claritin and being allegra 180mg  q day Charge nurse asked if order could be sent to them stating the medication change New order faxed to [phone]  Nothing further is needed

## 2014-12-04 NOTE — Telephone Encounter (Signed)
Ok to send note advising she stop claritin and use Allegra/ fexofenadine/ 180 mg once daily while needed for allergy.

## 2014-12-04 NOTE — Telephone Encounter (Signed)
Needs note faxed to nursing home saying that patient to stop Claritin and start Allegra.  Fax: [phone]  Allergies  Allergen Reactions  . Losartan     Angioedema   . Metformin And Related Diarrhea  . Latex Other (See Comments)    Other Reaction: redness, blisters  . Morphine And Related Other (See Comments)    Pt. States med makes her crazy  . Other Other (See Comments)    Other Reaction: redness, blisters  . Oxycodone-Acetaminophen Itching    TYLOX. Caused internal itching per patient    Current Outpatient Prescriptions on File Prior to Visit  Medication Sig Dispense Refill  . albuterol (PROVENTIL HFA;VENTOLIN HFA) 108 (90 BASE) MCG/ACT inhaler Inhale 2 puffs into the lungs every 6 (six) hours as needed for wheezing or shortness of breath. 1 Inhaler 2  . albuterol (PROVENTIL) (2.5 MG/3ML) 0.083% nebulizer solution Take 3 mLs (2.5 mg total) by nebulization every 6 (six) hours as needed for wheezing or shortness of breath. 75 mL 12  . aspirin EC 81 MG tablet Take 81 mg by mouth every morning.     . canagliflozin (INVOKANA) 300 MG TABS tablet Take 300 mg by mouth daily. 90 tablet 3  . Fluticasone Furoate-Vilanterol (BREO ELLIPTA) 200-25 MCG/INH AEPB Inhale 1 puff into the lungs daily. 2 each 0  . gabapentin (NEURONTIN) 300 MG capsule Take 1 tablet in the morning, 2 at night (Patient taking differently: Take 300-600 mg by mouth 2 (two) times daily. Take 1 tablet in the morning, 2 at night) 270 capsule 1  . glucose blood test strip Use TID  E11.9 (Patient not taking: Reported on 11/29/2014) 100 each 12  . HYDROcodone-acetaminophen (NORCO) 10-325 MG per tablet Take 1 tablet by mouth every 6 (six) hours as needed. (Patient taking differently: Take 1 tablet by mouth every 6 (six) hours as needed for moderate pain. ) 75 tablet 0  . Insulin Glargine (TOUJEO SOLOSTAR) 300 UNIT/ML SOPN Inject 50 Units into the skin daily. 1.5 mL 11  . Insulin Lispro, Human, (HUMALOG KWIKPEN) 200 UNIT/ML SOPN  Inject 5 Units into the skin 3 (three) times daily with meals. 9 mL 3  . ketoconazole (NIZORAL) 2 % cream Apply 1 application topically daily.    Marland Kitchen KLOR-CON M20 20 MEQ tablet Take 1 tablet (20 mEq total) by mouth once. 30 tablet 11  . levothyroxine (SYNTHROID, LEVOTHROID) 75 MCG tablet TAKE ONE TABLET BY MOUTH ONCE DAILY BEFORE BREAKFAST 90 tablet 2  . metoprolol succinate (TOPROL-XL) 50 MG 24 hr tablet Take 1 tablet (50 mg total) by mouth daily. Take with or immediately following a meal. 30 tablet 11  . Multiple Vitamins-Minerals (MULTIVITAMIN PO) Take 1 tablet by mouth daily.    Marland Kitchen nystatin cream (MYCOSTATIN) Apply 1 application topically 2 (two) times daily.    . Omega-3 Fatty Acids (FISH OIL PO) Take 1 capsule by mouth daily.    . ondansetron (ZOFRAN ODT) 8 MG disintegrating tablet Take 1 tablet (8 mg total) by mouth every 8 (eight) hours as needed for nausea or vomiting. 20 tablet 0  . predniSONE (DELTASONE) 50 MG tablet 1 tablet PO daily 5 tablet 0  . primidone (MYSOLINE) 50 MG tablet TAKE THREE TABLETS BY MOUTH TWICE DAILY 180 tablet 3  . promethazine-codeine (PHENERGAN WITH CODEINE) 6.25-10 MG/5ML syrup Take 5 mLs by mouth every 6 (six) hours as needed for cough. 200 mL 0  . ranitidine (ZANTAC) 150 MG capsule Take 150 mg by mouth 2 (two) times daily.    Marland Kitchen  sertraline (ZOLOFT) 100 MG tablet Take 100 mg by mouth daily.    Marland Kitchen tiZANidine (ZANAFLEX) 4 MG tablet Take 4 mg by mouth at bedtime as needed for muscle spasms.     Marland Kitchen triamterene-hydrochlorothiazide (DYAZIDE) 37.5-25 MG per capsule Take 1 capsule by mouth daily.  1  . Vilazodone HCl (VIIBRYD) 40 MG TABS Take 1 tablet (40 mg total) by mouth daily. 30 tablet 11   No current facility-administered medications on file prior to visit.

## 2014-12-05 ENCOUNTER — Ambulatory Visit (INDEPENDENT_AMBULATORY_CARE_PROVIDER_SITE_OTHER): Payer: Commercial Managed Care - HMO | Admitting: Internal Medicine

## 2014-12-05 ENCOUNTER — Encounter: Payer: Self-pay | Admitting: Internal Medicine

## 2014-12-05 ENCOUNTER — Ambulatory Visit: Payer: Commercial Managed Care - HMO | Admitting: Internal Medicine

## 2014-12-05 VITALS — BP 112/80 | HR 87 | Temp 98.4°F | Resp 16 | Ht 64.0 in

## 2014-12-05 DIAGNOSIS — B3781 Candidal esophagitis: Secondary | ICD-10-CM

## 2014-12-05 DIAGNOSIS — B370 Candidal stomatitis: Secondary | ICD-10-CM

## 2014-12-05 DIAGNOSIS — K2100 Gastro-esophageal reflux disease with esophagitis, without bleeding: Secondary | ICD-10-CM

## 2014-12-05 DIAGNOSIS — Z993 Dependence on wheelchair: Secondary | ICD-10-CM

## 2014-12-05 DIAGNOSIS — E119 Type 2 diabetes mellitus without complications: Secondary | ICD-10-CM

## 2014-12-05 DIAGNOSIS — M4806 Spinal stenosis, lumbar region: Secondary | ICD-10-CM

## 2014-12-05 DIAGNOSIS — E118 Type 2 diabetes mellitus with unspecified complications: Secondary | ICD-10-CM | POA: Diagnosis not present

## 2014-12-05 DIAGNOSIS — G20 Parkinson's disease: Secondary | ICD-10-CM

## 2014-12-05 DIAGNOSIS — M48061 Spinal stenosis, lumbar region without neurogenic claudication: Secondary | ICD-10-CM

## 2014-12-05 MED ORDER — NYSTATIN 100000 UNIT/ML MT SUSP: 5.0000 mL | Freq: Four times a day (QID) | OROMUCOSAL | Status: DC

## 2014-12-05 MED ORDER — INSULIN LISPRO 200 UNIT/ML ~~LOC~~ SOPN: 10.0000 [IU] | PEN_INJECTOR | Freq: Three times a day (TID) | SUBCUTANEOUS | Status: DC

## 2014-12-05 MED ORDER — SITAGLIPTIN PHOSPHATE 100 MG PO TABS: 100.0000 mg | ORAL_TABLET | Freq: Every day | ORAL | Status: DC

## 2014-12-05 MED ORDER — HYDROCODONE-ACETAMINOPHEN 10-325 MG PO TABS: 1.0000 | ORAL_TABLET | Freq: Four times a day (QID) | ORAL | Status: DC | PRN

## 2014-12-05 MED ORDER — INSULIN GLARGINE 300 UNIT/ML ~~LOC~~ SOPN: 100.0000 [IU] | PEN_INJECTOR | Freq: Every day | SUBCUTANEOUS | Status: DC

## 2014-12-05 NOTE — Progress Notes (Signed)
Subjective:  Patient ID: [name], female    DOB: [birth year]  Age: 64 y.o. MRN: [id number]  CC: Diabetes   HPI Cecilee Rosner presents for follow-up on diabetes. For the last few weeks her blood sugar has been consistently in the 300 range. In the last 2 weeks the lowest blood sugar she has recorded has been about 192. She has been eating at [employer] consuming hamburgers, french fries, sodas, ice cream and yogurt. She has also been on oral steroids. She has developed chronic vaginal yeast infections and cannot take Invokana anymore.  Outpatient Prescriptions Prior to Visit  Medication Sig Dispense Refill  . albuterol (PROVENTIL HFA;VENTOLIN HFA) 108 (90 BASE) MCG/ACT inhaler Inhale 2 puffs into the lungs every 6 (six) hours as needed for wheezing or shortness of breath. 1 Inhaler 2  . albuterol (PROVENTIL) (2.5 MG/3ML) 0.083% nebulizer solution Take 3 mLs (2.5 mg total) by nebulization every 6 (six) hours as needed for wheezing or shortness of breath. 75 mL 12  . aspirin EC 81 MG tablet Take 81 mg by mouth every morning.     . canagliflozin (INVOKANA) 300 MG TABS tablet Take 300 mg by mouth daily. 90 tablet 3  . fexofenadine (ALLEGRA) 180 MG tablet Take 1 tablet (180 mg total) by mouth daily. 30 tablet 4  . Fluticasone Furoate-Vilanterol (BREO ELLIPTA) 200-25 MCG/INH AEPB Inhale 1 puff into the lungs daily. 2 each 0  . gabapentin (NEURONTIN) 300 MG capsule Take 1 tablet in the morning, 2 at night (Patient taking differently: Take 300-600 mg by mouth 2 (two) times daily. Take 1 tablet in the morning, 2 at night) 270 capsule 1  . glucose blood test strip Use TID  E11.9 100 each 12  . ketoconazole (NIZORAL) 2 % cream Apply 1 application topically daily.    Marland Kitchen KLOR-CON M20 20 MEQ tablet Take 1 tablet (20 mEq total) by mouth once. 30 tablet 11  . levothyroxine (SYNTHROID, LEVOTHROID) 75 MCG tablet TAKE ONE TABLET BY MOUTH ONCE DAILY BEFORE BREAKFAST 90 tablet 2  . metoprolol succinate  (TOPROL-XL) 50 MG 24 hr tablet Take 1 tablet (50 mg total) by mouth daily. Take with or immediately following a meal. 30 tablet 11  . Multiple Vitamins-Minerals (MULTIVITAMIN PO) Take 1 tablet by mouth daily.    . Omega-3 Fatty Acids (FISH OIL PO) Take 1 capsule by mouth daily.    . ondansetron (ZOFRAN ODT) 8 MG disintegrating tablet Take 1 tablet (8 mg total) by mouth every 8 (eight) hours as needed for nausea or vomiting. 20 tablet 0  . primidone (MYSOLINE) 50 MG tablet TAKE THREE TABLETS BY MOUTH TWICE DAILY 180 tablet 3  . ranitidine (ZANTAC) 150 MG capsule Take 150 mg by mouth 2 (two) times daily.    Marland Kitchen tiZANidine (ZANAFLEX) 4 MG tablet Take 4 mg by mouth at bedtime as needed for muscle spasms.     Marland Kitchen triamterene-hydrochlorothiazide (DYAZIDE) 37.5-25 MG per capsule Take 1 capsule by mouth daily.  1  . Vilazodone HCl (VIIBRYD) 40 MG TABS Take 1 tablet (40 mg total) by mouth daily. 30 tablet 11  . HYDROcodone-acetaminophen (NORCO) 10-325 MG per tablet Take 1 tablet by mouth every 6 (six) hours as needed. (Patient taking differently: Take 1 tablet by mouth every 6 (six) hours as needed for moderate pain. ) 75 tablet 0  . Insulin Glargine (TOUJEO SOLOSTAR) 300 UNIT/ML SOPN Inject 50 Units into the skin daily. 1.5 mL 11  . Insulin Lispro, Human, (HUMALOG KWIKPEN) 200  UNIT/ML SOPN Inject 5 Units into the skin 3 (three) times daily with meals. 9 mL 3  . nystatin cream (MYCOSTATIN) Apply 1 application topically 2 (two) times daily.    . predniSONE (DELTASONE) 50 MG tablet 1 tablet PO daily 5 tablet 0  . promethazine-codeine (PHENERGAN WITH CODEINE) 6.25-10 MG/5ML syrup Take 5 mLs by mouth every 6 (six) hours as needed for cough. 200 mL 0  . sertraline (ZOLOFT) 100 MG tablet Take 100 mg by mouth daily.     No facility-administered medications prior to visit.    ROS Review of Systems  Constitutional: Negative for fever, chills, diaphoresis, appetite change and fatigue.  HENT: Positive for trouble  swallowing and voice change.   Eyes: Negative.   Respiratory: Negative.  Negative for cough, choking, chest tightness, shortness of breath and stridor.   Cardiovascular: Negative.  Negative for chest pain, palpitations and leg swelling.  Gastrointestinal: Negative.  Negative for nausea, vomiting, abdominal pain, diarrhea, constipation and blood in stool.  Endocrine: Positive for polydipsia, polyphagia and polyuria.  Genitourinary: Positive for frequency and vaginal discharge. Negative for dysuria, urgency, hematuria, flank pain, decreased urine volume, difficulty urinating and vaginal pain.  Musculoskeletal: Positive for arthralgias and gait problem. Negative for myalgias and joint swelling.  Skin: Negative.  Negative for rash.  Allergic/Immunologic: Negative.   Neurological: Positive for dizziness, tremors and weakness. Negative for light-headedness and numbness.  Hematological: Negative.   Psychiatric/Behavioral: Positive for sleep disturbance and decreased concentration. Negative for suicidal ideas, behavioral problems, confusion, self-injury and dysphoric mood. The patient is nervous/anxious. The patient is not hyperactive.     Objective:  BP 112/80 mmHg  Pulse 87  Temp(Src) 98.4 F (36.9 C) (Oral)  Ht 5\' 4"  (1.626 m)  SpO2 97%  BP Readings from Last 3 Encounters:  12/05/14 112/80  11/29/14 104/68  11/28/14 144/96    Wt Readings from Last 3 Encounters:  11/29/14 175 lb (79.379 kg)  11/28/14 174 lb (78.926 kg)  11/26/14 176 lb (79.833 kg)    Physical Exam  Constitutional: She is oriented to person, place, and time. No distress.  HENT:  Nose: Nose normal.  Mouth/Throat: Oropharynx is clear and moist. No oropharyngeal exudate.  Eyes: Conjunctivae are normal. Right eye exhibits no discharge. Left eye exhibits no discharge. No scleral icterus.  Neck: Normal range of motion. Neck supple. No JVD present. No tracheal deviation present. No thyromegaly present.  Cardiovascular:  Normal rate, regular rhythm, normal heart sounds and intact distal pulses.  Exam reveals no gallop and no friction rub.   No murmur heard. Pulmonary/Chest: Effort normal and breath sounds normal. No stridor. No respiratory distress. She has no wheezes. She has no rales. She exhibits no tenderness.  Abdominal: Soft. Bowel sounds are normal. She exhibits no distension and no mass. There is no tenderness. There is no rebound and no guarding.  Musculoskeletal: Normal range of motion. She exhibits no edema or tenderness.  Lymphadenopathy:    She has no cervical adenopathy.  Neurological: She is oriented to person, place, and time.  Skin: Skin is warm and dry. No rash noted. She is not diaphoretic. No erythema. No pallor.    Lab Results  Component Value Date   WBC 8.3 11/26/2014   HGB 13.8 11/26/2014   HCT 40.3 11/26/2014   PLT 193 11/26/2014   GLUCOSE 147* 11/26/2014   CHOL 198 02/12/2014   TRIG 180.0* 02/12/2014   HDL 68.20 02/12/2014   LDLCALC 94 02/12/2014   ALT 28 11/26/2014  AST 20 11/26/2014   NA 136 11/26/2014   K 3.6 11/26/2014   CL 101 11/26/2014   CREATININE 0.64 11/26/2014   BUN 12 11/26/2014   CO2 27 11/26/2014   TSH 0.60 10/23/2014   INR 1.0 02/27/2008   HGBA1C 8.2* 10/23/2014   MICROALBUR 0.7 10/27/2013    Dg Neck Soft Tissue  11/26/2014   CLINICAL DATA:  Intermittent moderate wheezing since this morning. History of vocal cord dysfunction.  EXAM: NECK SOFT TISSUES - 1+ VIEW  COMPARISON:  CT 09/28/2014  FINDINGS: Again noted are bilateral deep brain stimulator devices. Alignment of cervical spine is within normal limits. Again noted is disc space narrowing at C4-C7. The prevertebral soft tissues are within normal limits. Normal appearance of the epiglottis. Bilateral facet arthropathy in the cervical spine. Upper lungs are clear.  IMPRESSION: No acute abnormality.  Cervical spine degenerative disease.   Electronically Signed   By: Markus Daft M.D.   On: 11/26/2014 13:03     Dg Chest 2 View  11/26/2014   CLINICAL DATA:  Wheezing since this morning. Rhinorrhea, productive cough  EXAM: CHEST  2 VIEW  COMPARISON:  09/10/2014  FINDINGS: The heart size and mediastinal contours are within normal limits. Both lungs are clear. The visualized skeletal structures are unremarkable. Power packs noted along the anterior chest wall bilaterally.  IMPRESSION: No active cardiopulmonary disease.   Electronically Signed   By: Kathreen Devoid   On: 11/26/2014 13:01    Assessment & Plan:   Torunn was seen today for diabetes.  Diagnoses and all orders for this visit:   Type II diabetes mellitus with manifestations- her blood sugars are way too high. She cannot take Invokana anymore. Will start Januvia. I decided to double up the dose of both of her insulins a Tuesday oh and the Humalog. -     Insulin Glargine (TOUJEO SOLOSTAR) 300 UNIT/ML SOPN; Inject 100 Units into the skin daily. -     sitaGLIPtin (JANUVIA) 100 MG tablet; Take 1 tablet (100 mg total) by mouth daily.  Gastroesophageal reflux disease with esophagitis- she has developed chronic laryngitis and throat clearing, she wants to see gastroenterology to see if she needs an upper endoscopy. -     Ambulatory referral to Gastroenterology  Parkinson disease -     DME Wheelchair manual -     DME Other see comment  Wheelchair bound -     DME Wheelchair manual -     DME Other see comment  Spinal stenosis of lumbar region- we'll continue Norco for pain relief. -     HYDROcodone-acetaminophen (NORCO) 10-325 MG per tablet; Take 1 tablet by mouth every 6 (six) hours as needed.  Thrush of mouth and esophagus -     nystatin (MYCOSTATIN) 100000 UNIT/ML suspension; Take 5 mLs (500,000 Units total) by mouth 4 (four) times daily.  Type 2 diabetes mellitus without complications -     Insulin Glargine (TOUJEO SOLOSTAR) 300 UNIT/ML SOPN; Inject 100 Units into the skin daily. -     Insulin Lispro, Human, (HUMALOG KWIKPEN) 200 UNIT/ML  SOPN; Inject 10 Units into the skin 3 (three) times daily with meals.   I have discontinued Ms. Reaver's predniSONE and promethazine-codeine. I have also changed her nystatin, Insulin Glargine, and Insulin Lispro (Human). Additionally, I am having her start on sitaGLIPtin. Lastly, I am having her maintain her aspirin EC, glucose blood, Multiple Vitamins-Minerals (MULTIVITAMIN PO), Omega-3 Fatty Acids (FISH OIL PO), tiZANidine, canagliflozin, primidone, levothyroxine, gabapentin, triamterene-hydrochlorothiazide, ranitidine,  Fluticasone Furoate-Vilanterol, metoprolol succinate, KLOR-CON M20, Vilazodone HCl, ondansetron, albuterol, albuterol, ketoconazole, sertraline, fexofenadine, and HYDROcodone-acetaminophen.  Meds ordered this encounter  Medications  . DISCONTD: nystatin (MYCOSTATIN) 100000 UNIT/ML suspension    Sig: Take 5 mLs by mouth 4 (four) times daily.  Marland Kitchen HYDROcodone-acetaminophen (NORCO) 10-325 MG per tablet    Sig: Take 1 tablet by mouth every 6 (six) hours as needed.    Dispense:  75 tablet    Refill:  0  . nystatin (MYCOSTATIN) 100000 UNIT/ML suspension    Sig: Take 5 mLs (500,000 Units total) by mouth 4 (four) times daily.    Dispense:  60 mL    Refill:  5  . Insulin Glargine (TOUJEO SOLOSTAR) 300 UNIT/ML SOPN    Sig: Inject 100 Units into the skin daily.    Dispense:  1.5 mL    Refill:  11  . Insulin Lispro, Human, (HUMALOG KWIKPEN) 200 UNIT/ML SOPN    Sig: Inject 10 Units into the skin 3 (three) times daily with meals.    Dispense:  9 mL    Refill:  3  . sitaGLIPtin (JANUVIA) 100 MG tablet    Sig: Take 1 tablet (100 mg total) by mouth daily.    Dispense:  30 tablet    Refill:  11     Follow-up: Return in about 3 months (around 03/06/2015).  Scarlette Calico, MD

## 2014-12-05 NOTE — Patient Instructions (Signed)

## 2014-12-05 NOTE — Progress Notes (Signed)
Pre visit review using our clinic review tool, if applicable. No additional management support is needed unless otherwise documented below in the visit note. 

## 2014-12-06 ENCOUNTER — Telehealth: Payer: Self-pay | Admitting: Internal Medicine

## 2014-12-06 ENCOUNTER — Encounter: Payer: Self-pay | Admitting: Internal Medicine

## 2014-12-06 ENCOUNTER — Telehealth: Payer: Self-pay | Admitting: *Deleted

## 2014-12-06 MED ORDER — NYSTATIN 100000 UNIT/GM EX CREA: 1.0000 "application " | TOPICAL_CREAM | Freq: Two times a day (BID) | CUTANEOUS | Status: DC

## 2014-12-06 MED ORDER — FEXOFENADINE HCL 180 MG PO TABS: 180.0000 mg | ORAL_TABLET | Freq: Every day | ORAL | Status: DC

## 2014-12-06 MED ORDER — NYSTATIN 100000 UNIT/ML MT SUSP: 5.0000 mL | Freq: Four times a day (QID) | OROMUCOSAL | Status: DC

## 2014-12-06 NOTE — Telephone Encounter (Addendum)
Called and spoke to [place] at [facility] at [phone]. I was informed they need the phone note stating [facility] is changing the claritin to allegra. New allegra rx and phone note faxed to [phone]. Wells Guiles verbalized understanding and denied any further questions or concerns at this time.

## 2014-12-06 NOTE — Telephone Encounter (Signed)
Left msg on triage stating receive escript for Mycostatin. Need to know what strength...[name]

## 2014-12-06 NOTE — Telephone Encounter (Signed)
Routing for fyi

## 2014-12-06 NOTE — Telephone Encounter (Signed)
Pt cb, please refax order asap [phone] attention [name], this was suppose to have been refaxed on 8/30, received failed cinfirmation and given to [facility] for her to pass to nurse who sent this and they still never received it.

## 2014-12-06 NOTE — Telephone Encounter (Signed)
The director of [facility] called to let you know that patient and husband [name] can not have samples there at the facility.

## 2014-12-06 NOTE — Telephone Encounter (Signed)
lmomtcb x1 

## 2014-12-06 NOTE — Telephone Encounter (Signed)
lmtcb X1 for pt. Elizabeth Jersey do you still have this, or does a new form need to be written out? See 8/29 phone note.  Thanks!

## 2014-12-06 NOTE — Telephone Encounter (Signed)
Apolonio Schneiders took care of this message in [facility] faxed the rx over; if needed please resend a new Rx to them. Thanks.

## 2014-12-06 NOTE — Telephone Encounter (Signed)
Error wrong dr.Stanley A Dalton ° ° °

## 2014-12-07 ENCOUNTER — Telehealth: Payer: Self-pay | Admitting: Internal Medicine

## 2014-12-07 NOTE — Telephone Encounter (Signed)
Needs to speak to [facility] in regards to wheelchair order.

## 2014-12-08 IMAGING — CR DG CHEST 2V
2 series · 2 of 2 positions shown · non-contrast
Comparison: 01/21/2014

CLINICAL DATA: Increased shortness of breath and wheezing. Initial
encounter.

EXAM:
CHEST  2 VIEW

[view not recorded (1 of 2)]
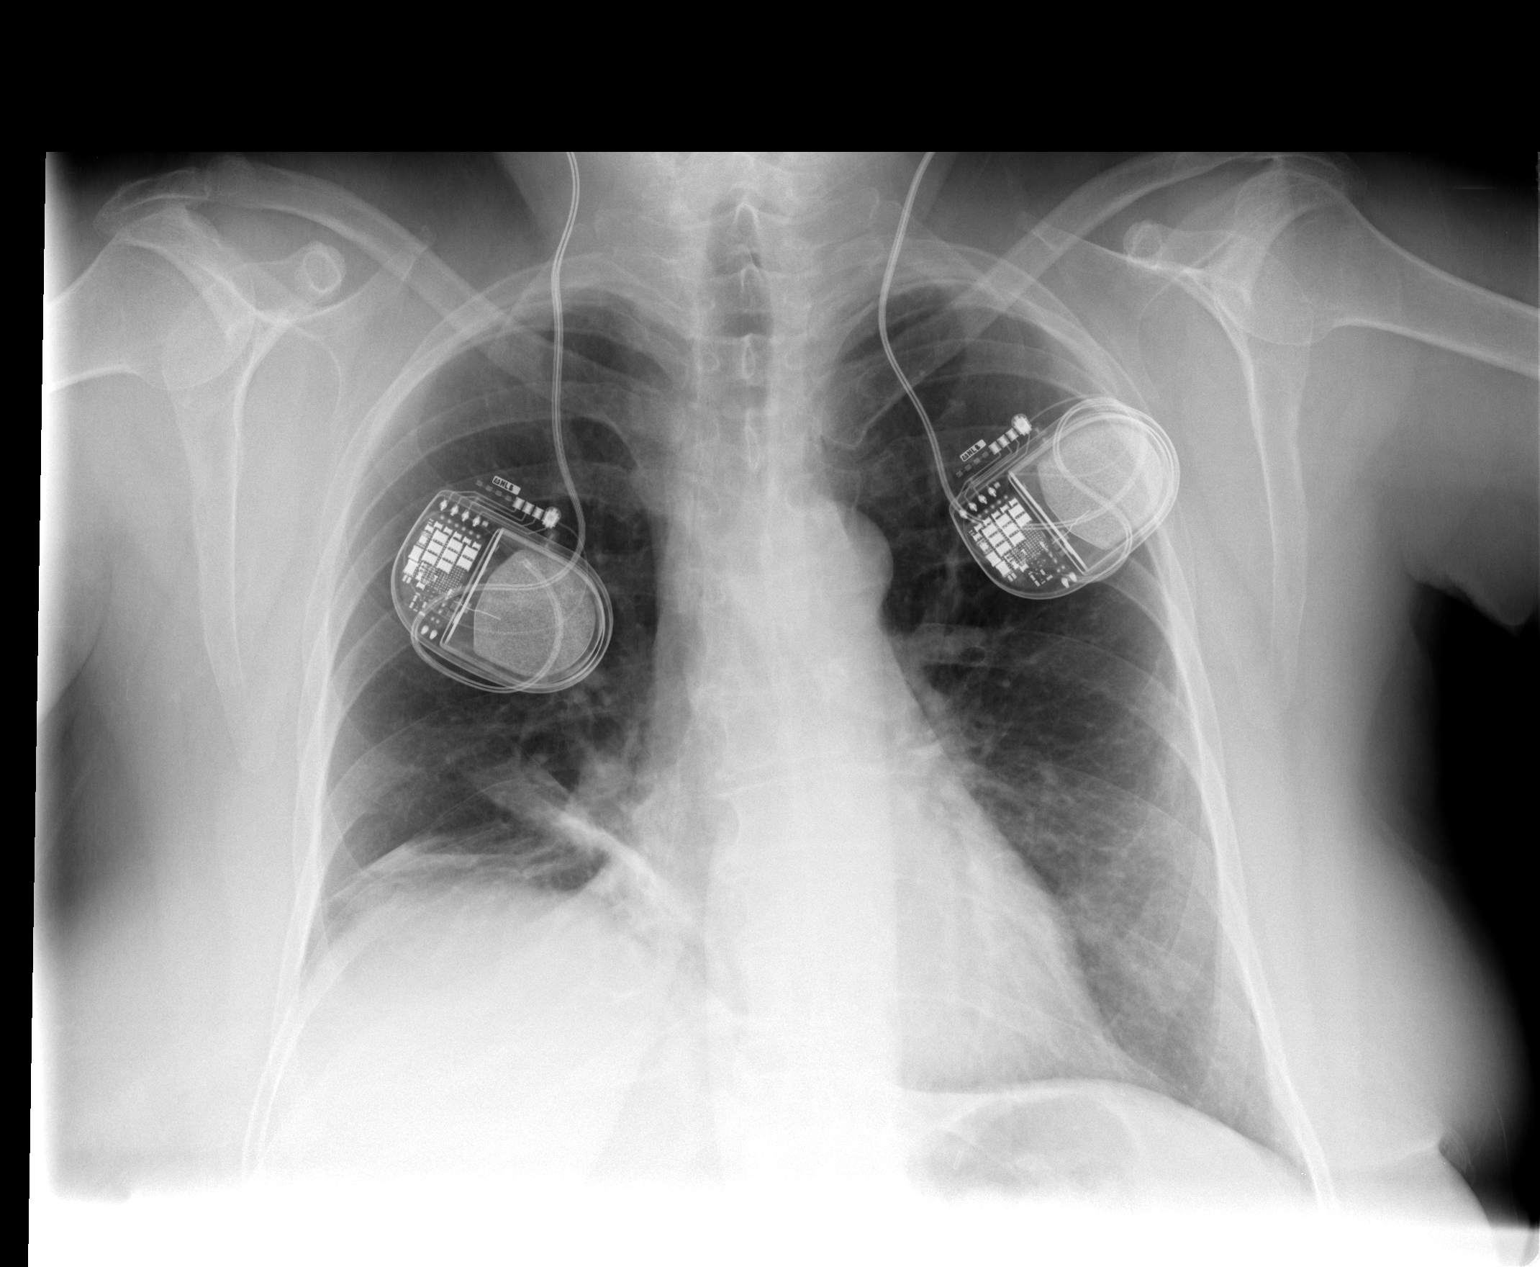

[view not recorded (2 of 2)]
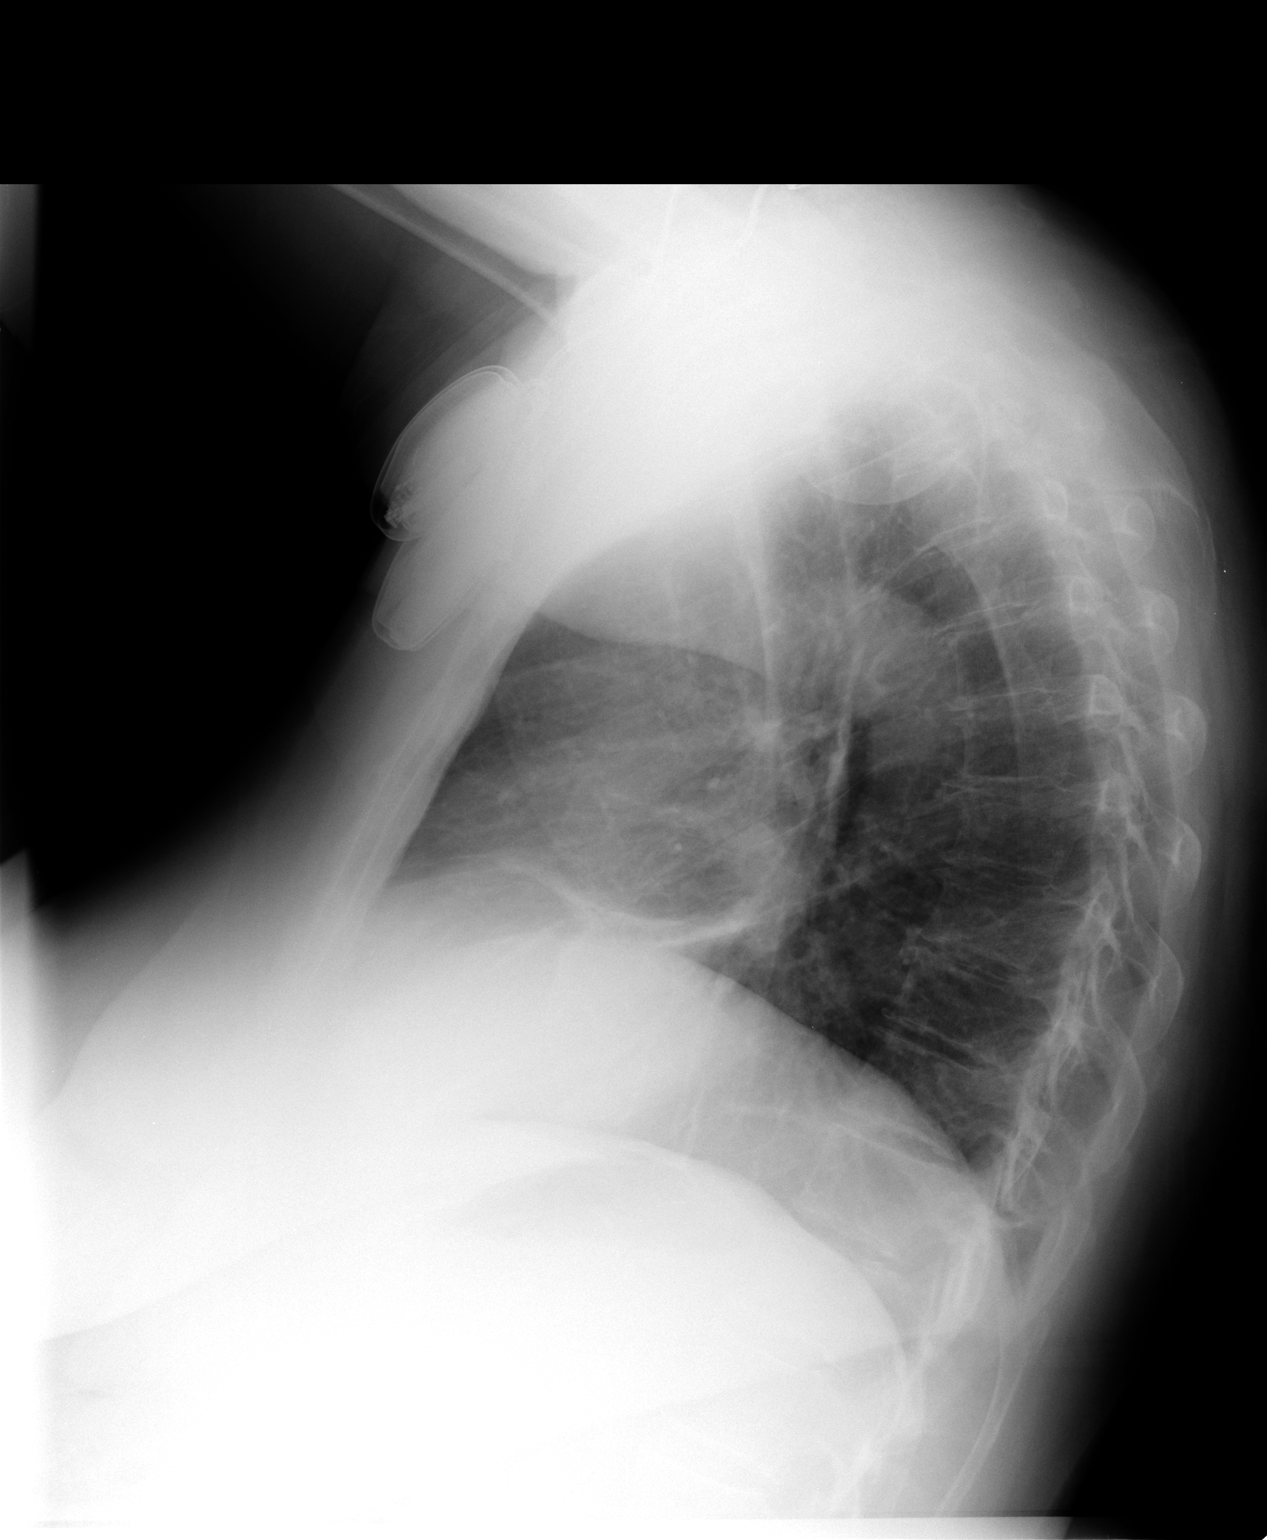

[2 of 2 positions shown; findings below may reference images not displayed]

FINDINGS: No cardiomegaly.  Negative aortic contours.

There is worsening opacification the right base with bandlike
opacity. Morphology suggests multi segment atelectasis. The right
diaphragm remains elevated. No edema, consolidation, effusion, or
pneumothorax.

Deep brain stimulator bilaterally with battery pack in the anterior
chest wall.
IMPRESSION: Right basilar atelectasis, increased from 01/21/2014.

## 2014-12-10 ENCOUNTER — Other Ambulatory Visit: Payer: Self-pay | Admitting: Internal Medicine

## 2014-12-12 NOTE — Telephone Encounter (Signed)
LMOVM. Will try again

## 2014-12-12 NOTE — Telephone Encounter (Signed)
Spoke with [name] she will be faxing over [employer] for wheelchair

## 2014-12-13 ENCOUNTER — Ambulatory Visit (INDEPENDENT_AMBULATORY_CARE_PROVIDER_SITE_OTHER): Payer: Commercial Managed Care - HMO | Admitting: Internal Medicine

## 2014-12-13 ENCOUNTER — Telehealth: Payer: Self-pay | Admitting: Internal Medicine

## 2014-12-13 ENCOUNTER — Encounter (HOSPITAL_COMMUNITY): Payer: Self-pay | Admitting: Emergency Medicine

## 2014-12-13 ENCOUNTER — Other Ambulatory Visit (INDEPENDENT_AMBULATORY_CARE_PROVIDER_SITE_OTHER): Payer: Commercial Managed Care - HMO

## 2014-12-13 ENCOUNTER — Emergency Department (HOSPITAL_COMMUNITY): Payer: Commercial Managed Care - HMO

## 2014-12-13 ENCOUNTER — Emergency Department (HOSPITAL_COMMUNITY)
Admission: EM | Admit: 2014-12-13 | Discharge: 2014-12-14 | Disposition: A | Discharge status: Home / Self Care | Payer: Commercial Managed Care - HMO | Attending: Emergency Medicine | Admitting: Emergency Medicine

## 2014-12-13 ENCOUNTER — Encounter: Payer: Self-pay | Admitting: Internal Medicine

## 2014-12-13 VITALS — BP 130/76 | HR 97 | Ht 64.0 in | Wt 179.8 lb

## 2014-12-13 DIAGNOSIS — F919 Conduct disorder, unspecified: Secondary | ICD-10-CM | POA: Insufficient documentation

## 2014-12-13 DIAGNOSIS — R079 Chest pain, unspecified: Secondary | ICD-10-CM | POA: Diagnosis present

## 2014-12-13 DIAGNOSIS — R011 Cardiac murmur, unspecified: Secondary | ICD-10-CM | POA: Diagnosis not present

## 2014-12-13 DIAGNOSIS — K219 Gastro-esophageal reflux disease without esophagitis: Secondary | ICD-10-CM

## 2014-12-13 DIAGNOSIS — Z7951 Long term (current) use of inhaled steroids: Secondary | ICD-10-CM | POA: Insufficient documentation

## 2014-12-13 DIAGNOSIS — F329 Major depressive disorder, single episode, unspecified: Secondary | ICD-10-CM | POA: Diagnosis not present

## 2014-12-13 DIAGNOSIS — Z79899 Other long term (current) drug therapy: Secondary | ICD-10-CM | POA: Insufficient documentation

## 2014-12-13 DIAGNOSIS — J45909 Unspecified asthma, uncomplicated: Secondary | ICD-10-CM | POA: Diagnosis not present

## 2014-12-13 DIAGNOSIS — F419 Anxiety disorder, unspecified: Secondary | ICD-10-CM | POA: Insufficient documentation

## 2014-12-13 DIAGNOSIS — F332 Major depressive disorder, recurrent severe without psychotic features: Secondary | ICD-10-CM | POA: Diagnosis present

## 2014-12-13 DIAGNOSIS — Z91040 Latex allergy status: Secondary | ICD-10-CM | POA: Insufficient documentation

## 2014-12-13 DIAGNOSIS — I10 Essential (primary) hypertension: Secondary | ICD-10-CM | POA: Insufficient documentation

## 2014-12-13 DIAGNOSIS — IMO0002 Reserved for concepts with insufficient information to code with codable children: Secondary | ICD-10-CM

## 2014-12-13 DIAGNOSIS — Z87891 Personal history of nicotine dependence: Secondary | ICD-10-CM | POA: Insufficient documentation

## 2014-12-13 DIAGNOSIS — G8929 Other chronic pain: Secondary | ICD-10-CM | POA: Diagnosis not present

## 2014-12-13 DIAGNOSIS — IMO0001 Reserved for inherently not codable concepts without codable children: Secondary | ICD-10-CM

## 2014-12-13 DIAGNOSIS — J4520 Mild intermittent asthma, uncomplicated: Secondary | ICD-10-CM | POA: Diagnosis not present

## 2014-12-13 DIAGNOSIS — K5900 Constipation, unspecified: Secondary | ICD-10-CM | POA: Diagnosis not present

## 2014-12-13 DIAGNOSIS — Z7982 Long term (current) use of aspirin: Secondary | ICD-10-CM | POA: Insufficient documentation

## 2014-12-13 DIAGNOSIS — E119 Type 2 diabetes mellitus without complications: Secondary | ICD-10-CM | POA: Insufficient documentation

## 2014-12-13 DIAGNOSIS — Z794 Long term (current) use of insulin: Secondary | ICD-10-CM | POA: Insufficient documentation

## 2014-12-13 DIAGNOSIS — F450 Somatization disorder: Secondary | ICD-10-CM | POA: Diagnosis not present

## 2014-12-13 DIAGNOSIS — Z654 Victim of crime and terrorism: Secondary | ICD-10-CM | POA: Diagnosis not present

## 2014-12-13 LAB — I-STAT TROPONIN, ED: Troponin i, poc: 0 ng/mL (ref 0.00–0.08)

## 2014-12-13 LAB — CBC WITH DIFFERENTIAL/PLATELET
Basophils Absolute: 0 10*3/uL (ref 0.0–0.1)
Basophils Relative: 0.3 % (ref 0.0–3.0)
Eosinophils Absolute: 0.2 10*3/uL (ref 0.0–0.7)
Eosinophils Relative: 2.3 % (ref 0.0–5.0)
HCT: 39.2 % (ref 36.0–46.0)
Hemoglobin: 13.3 g/dL (ref 12.0–15.0)
Lymphocytes Relative: 31 % (ref 12.0–46.0)
Lymphs Abs: 2.3 10*3/uL (ref 0.7–4.0)
MCHC: 33.8 g/dL (ref 30.0–36.0)
MCV: 91.9 fl (ref 78.0–100.0)
Monocytes Absolute: 0.6 10*3/uL (ref 0.1–1.0)
Monocytes Relative: 7.3 % (ref 3.0–12.0)
Neutro Abs: 4.5 10*3/uL (ref 1.4–7.7)
Neutrophils Relative %: 59.1 % (ref 43.0–77.0)
Platelets: 190 10*3/uL (ref 150.0–400.0)
RBC: 4.27 Mil/uL (ref 3.87–5.11)
RDW: 13.6 % (ref 11.5–15.5)
WBC: 7.6 10*3/uL (ref 4.0–10.5)

## 2014-12-13 LAB — BASIC METABOLIC PANEL
Anion gap: 11 (ref 5–15)
BUN: 10 mg/dL (ref 6–20)
CO2: 30 mmol/L (ref 22–32)
Calcium: 9 mg/dL (ref 8.9–10.3)
Chloride: 95 mmol/L — ABNORMAL LOW (ref 101–111)
Creatinine, Ser: 0.97 mg/dL (ref 0.44–1.00)
GFR calc Af Amer: >60 mL/min (ref >60)
GFR calc non Af Amer: 60 mL/min — ABNORMAL LOW (ref >60)
Glucose, Bld: 201 mg/dL — ABNORMAL HIGH (ref 65–99)
Potassium: 4.3 mmol/L (ref 3.5–5.1)
Sodium: 136 mmol/L (ref 135–145)

## 2014-12-13 LAB — CBC
HCT: 41 % (ref 36.0–46.0)
Hemoglobin: 13.6 g/dL (ref 12.0–15.0)
MCH: 30.9 pg (ref 26.0–34.0)
MCHC: 33.2 g/dL (ref 30.0–36.0)
MCV: 93.2 fL (ref 78.0–100.0)
Platelets: 200 K/uL (ref 150–400)
RBC: 4.4 MIL/uL (ref 3.87–5.11)
RDW: 13.4 % (ref 11.5–15.5)
WBC: 8.2 K/uL (ref 4.0–10.5)

## 2014-12-13 LAB — CBG MONITORING, ED: Glucose-Capillary: 231 mg/dL — ABNORMAL HIGH (ref 65–99)

## 2014-12-13 MED ORDER — LEVOTHYROXINE SODIUM 75 MCG PO TABS
75.0000 ug | ORAL_TABLET | Freq: Every day | ORAL | Status: DC
  Administered 2014-12-14: 75 ug via ORAL
  Filled 2014-12-13 (×2): qty 1

## 2014-12-13 MED ORDER — ALBUTEROL SULFATE HFA 108 (90 BASE) MCG/ACT IN AERS: 2.0000 | INHALATION_SPRAY | Freq: Four times a day (QID) | RESPIRATORY_TRACT | Status: DC | PRN

## 2014-12-13 MED ORDER — LINAGLIPTIN 5 MG PO TABS
5.0000 mg | ORAL_TABLET | Freq: Every day | ORAL | Status: DC
  Administered 2014-12-14: 5 mg via ORAL
  Filled 2014-12-13: qty 1

## 2014-12-13 MED ORDER — PRIMIDONE 50 MG PO TABS
150.0000 mg | ORAL_TABLET | Freq: Two times a day (BID) | ORAL | Status: DC
  Administered 2014-12-14 (×2): 150 mg via ORAL
  Filled 2014-12-13 (×3): qty 3

## 2014-12-13 MED ORDER — METOPROLOL SUCCINATE ER 25 MG PO TB24
50.0000 mg | ORAL_TABLET | Freq: Every day | ORAL | Status: DC
  Administered 2014-12-14: 50 mg via ORAL
  Filled 2014-12-13: qty 2

## 2014-12-13 MED ORDER — VILAZODONE HCL 40 MG PO TABS
40.0000 mg | ORAL_TABLET | Freq: Every day | ORAL | Status: DC
  Administered 2014-12-14: 40 mg via ORAL
  Filled 2014-12-13: qty 1

## 2014-12-13 MED ORDER — TIZANIDINE HCL 4 MG PO TABS
4.0000 mg | ORAL_TABLET | Freq: Every evening | ORAL | Status: DC | PRN
  Filled 2014-12-13: qty 1

## 2014-12-13 MED ORDER — FLUTICASONE FUROATE-VILANTEROL 200-25 MCG/INH IN AEPB: 1.0000 | INHALATION_SPRAY | Freq: Every day | RESPIRATORY_TRACT | Status: DC

## 2014-12-13 MED ORDER — NYSTATIN 100000 UNIT/ML MT SUSP
5.0000 mL | Freq: Four times a day (QID) | OROMUCOSAL | Status: DC
  Administered 2014-12-14 (×3): 500000 [IU] via ORAL
  Filled 2014-12-13 (×5): qty 5

## 2014-12-13 MED ORDER — NON FORMULARY: 100.0000 mg | Freq: Every day | Status: DC

## 2014-12-13 MED ORDER — POTASSIUM CHLORIDE CRYS ER 20 MEQ PO TBCR
20.0000 meq | EXTENDED_RELEASE_TABLET | Freq: Once | ORAL | Status: AC
  Administered 2014-12-14: 20 meq via ORAL
  Filled 2014-12-13: qty 1

## 2014-12-13 MED ORDER — TRIAMTERENE-HCTZ 37.5-25 MG PO CAPS
1.0000 | ORAL_CAPSULE | Freq: Every day | ORAL | Status: DC
  Filled 2014-12-13: qty 1

## 2014-12-13 MED ORDER — ALBUTEROL SULFATE (2.5 MG/3ML) 0.083% IN NEBU
2.5000 mg | INHALATION_SOLUTION | Freq: Four times a day (QID) | RESPIRATORY_TRACT | Status: DC | PRN
  Filled 2014-12-13 (×2): qty 3

## 2014-12-13 MED ORDER — INSULIN GLARGINE 100 UNIT/ML ~~LOC~~ SOLN
100.0000 [IU] | Freq: Every day | SUBCUTANEOUS | Status: DC
  Administered 2014-12-14: 100 [IU] via SUBCUTANEOUS
  Filled 2014-12-13: qty 1

## 2014-12-13 MED ORDER — GABAPENTIN 300 MG PO CAPS
300.0000 mg | ORAL_CAPSULE | Freq: Two times a day (BID) | ORAL | Status: DC
  Administered 2014-12-14 (×2): 300 mg via ORAL
  Filled 2014-12-13 (×2): qty 1

## 2014-12-13 MED ORDER — ONDANSETRON HCL 4 MG PO TABS: 4.0000 mg | ORAL_TABLET | Freq: Three times a day (TID) | ORAL | Status: DC | PRN

## 2014-12-13 MED ORDER — HYDROCODONE-ACETAMINOPHEN 10-325 MG PO TABS
1.0000 | ORAL_TABLET | Freq: Three times a day (TID) | ORAL | Status: DC | PRN
  Administered 2014-12-14 (×2): 1 via ORAL
  Filled 2014-12-13 (×2): qty 1

## 2014-12-13 MED ORDER — LORAZEPAM 1 MG PO TABS: 1.0000 mg | ORAL_TABLET | Freq: Three times a day (TID) | ORAL | Status: DC | PRN

## 2014-12-13 MED ORDER — ASPIRIN EC 81 MG PO TBEC
81.0000 mg | DELAYED_RELEASE_TABLET | Freq: Every morning | ORAL | Status: DC
  Administered 2014-12-14: 81 mg via ORAL
  Filled 2014-12-13: qty 1

## 2014-12-13 MED ORDER — FAMOTIDINE 20 MG PO TABS
10.0000 mg | ORAL_TABLET | Freq: Two times a day (BID) | ORAL | Status: DC
  Administered 2014-12-14 (×2): 10 mg via ORAL
  Filled 2014-12-13 (×2): qty 1

## 2014-12-13 MED ORDER — INSULIN ASPART 100 UNIT/ML ~~LOC~~ SOLN
10.0000 [IU] | Freq: Three times a day (TID) | SUBCUTANEOUS | Status: DC
  Administered 2014-12-14 (×2): 10 [IU] via SUBCUTANEOUS
  Filled 2014-12-13: qty 1

## 2014-12-13 MED ORDER — CANAGLIFLOZIN 300 MG PO TABS
300.0000 mg | ORAL_TABLET | Freq: Every day | ORAL | Status: DC
  Filled 2014-12-13: qty 300
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
## 2014-12-13 NOTE — ED Notes (Signed)
EDP at bedside evaluating pt., explained plan of care to pt.

## 2014-12-13 NOTE — Telephone Encounter (Signed)
Patient is requesting a prescription of miralax. She states that the opoid meds are constipating her severely. She asks that we fax it in to [facility] @ [phone]
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
## 2014-12-13 NOTE — Patient Instructions (Addendum)
Order- lab- CBC w diff, Allergy profile     Dx asthma, moderate intermittent  Order- office spirometry      We can continue current meds  Keep the planned referral to GI about your reflux. We think reflux is part of the trigger for your breathing troubles.  Keep planned return in February

## 2014-12-14 ENCOUNTER — Telehealth: Payer: Self-pay | Admitting: Internal Medicine

## 2014-12-14 DIAGNOSIS — F919 Conduct disorder, unspecified: Secondary | ICD-10-CM | POA: Diagnosis not present

## 2014-12-14 DIAGNOSIS — F332 Major depressive disorder, recurrent severe without psychotic features: Secondary | ICD-10-CM | POA: Diagnosis not present

## 2014-12-14 DIAGNOSIS — Z654 Victim of crime and terrorism: Secondary | ICD-10-CM | POA: Diagnosis not present

## 2014-12-14 LAB — ALLERGY FULL PROFILE
Allergen, D pternoyssinus,d7: 0.74 kU/L — ABNORMAL HIGH
Allergen,Goose feathers, e70: <0.1 kU/L
Alternaria Alternata: <0.1 kU/L
Aspergillus fumigatus, m3: <0.1 kU/L
Bahia Grass: 0.11 kU/L — ABNORMAL HIGH
Bermuda Grass: 0.13 kU/L — ABNORMAL HIGH
Box Elder IgE: 0.1 kU/L — ABNORMAL HIGH
Candida Albicans: <0.1 kU/L
Cat Dander: <0.1 kU/L
Common Ragweed: 0.11 kU/L — ABNORMAL HIGH
Curvularia lunata: <0.1 kU/L
D. farinae: 1.71 kU/L — ABNORMAL HIGH
Dog Dander: 0.2 kU/L — ABNORMAL HIGH
Elm IgE: 0.26 kU/L — ABNORMAL HIGH
Fescue: 0.12 kU/L — ABNORMAL HIGH
G005 Rye, Perennial: <0.1 kU/L
G009 Red Top: <0.1 kU/L
Goldenrod: <0.1 kU/L
Helminthosporium halodes: <0.1 kU/L
House Dust Hollister: 0.1 kU/L — ABNORMAL HIGH
IgE (Immunoglobulin E), Serum: 159 kU/L — ABNORMAL HIGH (ref <115)
Lamb's Quarters: 0.11 kU/L — ABNORMAL HIGH
Oak: 0.12 kU/L — ABNORMAL HIGH
Plantain: 0.1 kU/L — ABNORMAL HIGH
Stemphylium Botryosum: <0.1 kU/L
Sycamore Tree: 0.12 kU/L — ABNORMAL HIGH
Timothy Grass: <0.1 kU/L

## 2014-12-14 LAB — CBG MONITORING, ED
Glucose-Capillary: 245 mg/dL — ABNORMAL HIGH (ref 65–99)
Glucose-Capillary: 153 mg/dL — ABNORMAL HIGH (ref 65–99)
Glucose-Capillary: 131 mg/dL — ABNORMAL HIGH (ref 65–99)

## 2014-12-14 MED ORDER — GABAPENTIN 300 MG PO CAPS
600.0000 mg | ORAL_CAPSULE | Freq: Three times a day (TID) | ORAL | Status: DC
  Administered 2014-12-14: 600 mg via ORAL
  Filled 2014-12-14: qty 2

## 2014-12-14 NOTE — ED Notes (Signed)
All belongings logged.  Valuables to safe and clothing placed in utilities room.

## 2014-12-14 NOTE — ED Notes (Signed)
pharmqacist earl  Reported to give the lantus.  Pt does not want any inhaler etc no sib

## 2014-12-14 NOTE — ED Notes (Signed)
Pt. wearing blue scrubs and security notified to wand pt.

## 2014-12-14 NOTE — ED Notes (Signed)
Pt happy just used bedside commode.  No complaints
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
## 2014-12-14 NOTE — Telephone Encounter (Signed)
The best thing for someone to do is to take away the car and the keys.
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
## 2014-12-14 NOTE — Discharge Instructions (Signed)
Substance Abuse Treatment Programs ° °Intensive Outpatient Programs °[facility]     °[address]      °[place], [place]                   °[phone]      ° °[facility] °[address] °[place], [place] °[phone] ° °[facility]     °(Inpatient and outpatient)     °[address]           °[phone]   ° °[facility] °[phone] (Suboxone and Methadone) ° °[address]      °[place], [place] [address]      °[phone]      ° °[address] °[place], [place] °852-3033 ° °[facility] (Outpatient/Inpatient, [facility])    °(insurance only) [phone]      °       °[facility] (Groups & Residential) °[place], [place] °[phone] ° °   °[facility]     °[address]     °[place], [place]      °[phone]      ° °[facility] (for caregivers and family) °[address] °[place], [place] °336-299-4655 ° ° ° ° ° °Residential Treatment Programs °[facility]      °[address], [place], [place] [address]  °([phone]      ° °[facility] °[address] [place], [place] [address] °[phone] ° °[facility]        °[phone]      ° °[facility] °1-800-659-3381 ° °[facility]             °[address]                                         °[place], [place]                                                °877-615-2722 or 336-784-9470                              ° °[facility] [place] °[address] °[place], [address] °1.877.941.8954 ° °[facility]    °[address]      °[place], [place]     °[phone]      ° °[facility] °[address] °[place], [place] °336-285-9073 ° °[facility]   °[address]     °[place], [place] [address]     °[phone]      °Admissions: 8am-3pm M-F ° °[facility] °[address] °,  [place] °336-227-7417 ° °[facility]: Residential Program (90 Days)   °[place], [place]      °336-725-8389 or 800-758-6077    ° °[facility] °[place], [place] °(Walk in Hours over the weekend or by referral) ° °[facility] °[address], [place], [place] [address] °([phone] ° °Crisis Mobile: [facility]:  1-877-626-1772 (for crisis response 24 hours a day) °[facility] Hotline:      1-800-256-2452 °Outpatient Psychiatry and Counseling ° °[facility]: Mobile Crisis   Management 24 hours:  1-877-626-1772 ° °[facility] sliding scale fee and walk in schedule: M-F 8am-12pm/1pm-3pm °[address]  °[place], [place] [address] °[phone] ° °[facility] °[address] °[place], [place] [address] °[phone] ° °[facility] ([facility] known [facility])- new patient walk-in appointments available Monday - Friday 8am -3pm.          °[address] °[place], [place] [address] °336-676-6840 or crisis line- 336-676-6905 ° °[facility]/ Intensive Outpatient Therapy Program °[address] °[place], [place] [address] °336-832-9804 ° °[facility]                  °Crisis Services      °[phone]      °[address]     °[place], [place] [address]                ° °[facility]   °[facility] °[phone] °[address] °[place], [place] [address] ° ° °[facility]          °[address]  °[place], [place] [address]       °([phone] ° °[facility] °[address] °[place], [place] [address] °336-292-1510 ° °[facility]    °[address]    °[place], [place] [address]     °[phone]      ° °Parish McKinney, MD     °[address]     °[place] [address]     °[phone]     °  °[facility] °[address] °[place] [address] ° °[facility]     °[address]     °[place], [place]      °[phone]      ° °[facility] °Shamsher Ahluwalia, MD °[address] °[place], [place] [address] °[phone] ° °[facility]     °[address]     °[place], [place] [address]     °336-274-1237      ° °[facility] °[address] °[place], [place] [address] °336-854-4450 °Resources for Temporary Residential Assistance/Crisis Centers ° °DAY CENTERS °[facility] °M-F 8am-3pm   °[address] [place], [place] [address]   336-332-0824 °Services include: laundry, barbering, support groups, case management, phone  & computer access, showers, AA/NA mtgs, mental health/substance abuse nurse, job skills class, disability information, VA assistance, spiritual classes, etc.  ° °HOMELESS SHELTERS ° °[facility]     °[facility]   °[address], [place]     °[phone]       °       °[facility] (women and children)       °[address] °[place], [place] [address] °336-275-0820 °[email or web address] for application and process °Application Required ° °[facility]   °[address]    °[place] [address]     °[phone]       °             °[facility] °[address] °[place], [place] [address] °[phone] °336-235-0363(schedule application appt.) °Application Required ° °[facility] (women only)    °[address]     °[place],  [address]     °336-884-1039      °  Intake starts 6pm daily °Need valid ID, SSC, & Police report °[facility] [place] °[address] °[place], [place] °336-881-5420 °Application Required ° °[facility] (men only)     °[address]      °[place], [place]     °[phone]      ° °[facility] °(Pregnant women only) °[address] °[place], [place] °336-275-0206 ° °[facility]      °[address]      °[place], [place] [address]     °[phone]      °       °[facility] °[address] °[place], [place] °[phone] °90 day commitment/SA/Application process ° °[facility](men only)     °[address]     °[place], [place]     °[phone]       °Check-in at 7pm     °       °[facility] °[address] °[place], [place] [address] °336-248-6684 °Men/Women/Women and Children must be there by 7 pm ° °[facility] °[place], [place] °336-722-8721                ° °
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
## 2014-12-14 NOTE — ED Notes (Signed)
[facility] [place] .  Daughter is taking her there
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
## 2014-12-14 NOTE — ED Notes (Signed)
Notified pharmacy for pt daily medications.

## 2014-12-14 NOTE — Telephone Encounter (Signed)
Please advise 

## 2014-12-16 NOTE — ED Notes (Signed)
NOTE: PT'S VALUABLES REMAIN W/SECURITY - PT'S DAUGHTER, [place], [phone] - AWARE AND ADVISED SHE WILL REQUEST HER MOTHER-IN-LAW TO PICK THEM UP. E CRAWFORD, SECURITY, AWARE.

## 2014-12-18 ENCOUNTER — Telehealth: Payer: Self-pay | Admitting: Internal Medicine

## 2014-12-18 NOTE — Telephone Encounter (Signed)
Patient states that she would like [place] to refer her to a new psychiatrist. She prefers one in the [facility] so that they have access to epic.   Patient states that she spoke to a psychiatrist in the ER who suggested cymbalta over vybrid.  Patient also advised that she had separated from her husband and moved to: [place] [address] Ph # [phone] They will provide transportation.

## 2014-12-19 NOTE — Telephone Encounter (Signed)
Pt doesn't feel that she needs a psychiatrist now. She is feeling great since she left her husband. Pt doesn't know about the med change either. She wants to wait and see you before any med changes are made.   Pt is requesting for a letter to be sent to her new assisted living facility that states she does not take Invok[employer] anymore. If this is okay, I can type up a letter for you and send to them.  Please advise

## 2014-12-19 NOTE — Telephone Encounter (Signed)
Yes, please type up the letter

## 2014-12-25 ENCOUNTER — Ambulatory Visit (INDEPENDENT_AMBULATORY_CARE_PROVIDER_SITE_OTHER): Payer: Commercial Managed Care - HMO | Admitting: Internal Medicine

## 2014-12-25 ENCOUNTER — Encounter: Payer: Self-pay | Admitting: Internal Medicine

## 2014-12-25 VITALS — BP 140/82 | HR 86 | Temp 98.2°F | Resp 16 | Ht 64.0 in | Wt 176.0 lb

## 2014-12-25 DIAGNOSIS — M48061 Spinal stenosis, lumbar region without neurogenic claudication: Secondary | ICD-10-CM

## 2014-12-25 DIAGNOSIS — N393 Stress incontinence (female) (male): Secondary | ICD-10-CM | POA: Diagnosis not present

## 2014-12-25 DIAGNOSIS — M4806 Spinal stenosis, lumbar region: Secondary | ICD-10-CM

## 2014-12-25 DIAGNOSIS — R32 Unspecified urinary incontinence: Secondary | ICD-10-CM | POA: Insufficient documentation

## 2014-12-25 MED ORDER — INSULIN PEN NEEDLE 30G X 8 MM MISC: Status: DC

## 2014-12-25 MED ORDER — HYDROCODONE-ACETAMINOPHEN 10-325 MG PO TABS: 1.0000 | ORAL_TABLET | Freq: Four times a day (QID) | ORAL | Status: DC | PRN

## 2014-12-25 NOTE — Telephone Encounter (Signed)
[phone] Attn: MEDICINE CHANGES

## 2014-12-25 NOTE — Progress Notes (Signed)
Pre visit review using our clinic review tool, if applicable. No additional management support is needed unless otherwise documented below in the visit note. 

## 2014-12-25 NOTE — Patient Instructions (Signed)

## 2014-12-25 NOTE — Telephone Encounter (Signed)
Letter printed for PCP to sign.

## 2014-12-26 ENCOUNTER — Telehealth: Payer: Self-pay | Admitting: Internal Medicine

## 2014-12-26 ENCOUNTER — Encounter: Payer: Self-pay | Admitting: Gastroenterology

## 2014-12-26 DIAGNOSIS — N3281 Overactive bladder: Secondary | ICD-10-CM

## 2014-12-26 NOTE — Telephone Encounter (Signed)
Faxed letter

## 2014-12-26 NOTE — Telephone Encounter (Signed)
Tory from [facility] assisted [facility] called regarding he was in with patient yesterday and all patients current meds was not on the [facility] and he is wondering what the best way to get her current med list to them. Can you please call him at [phone] to discuss this.

## 2014-12-26 NOTE — Telephone Encounter (Signed)
Patient is requesting to speak with Gabby. She states she needs to discuss something she forgot about during her visit yesterday. CB# [phone]
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
## 2014-12-27 NOTE — Telephone Encounter (Signed)
Spoke with pt she states she feels like her bladder is not emptying fully which is causing the incontinence at night. An order was sent for pull ups. Pt wants to know should she make an OV?

## 2014-12-27 NOTE — Telephone Encounter (Signed)
Per Cornell Barman. Pharmacy believes things were missing off FL2 form was given on 9/20 with most current list. [facility]acy: [facility] [place] contact [place]. [phone] will call to clarify

## 2014-12-27 NOTE — Telephone Encounter (Signed)
Per pharmacy another FL2 needs to be done with doctors sig. Will obtain and fax

## 2014-12-27 NOTE — Telephone Encounter (Signed)
FL2 faxed 

## 2014-12-28 ENCOUNTER — Telehealth: Payer: Self-pay | Admitting: Internal Medicine

## 2014-12-28 MED ORDER — FLUTICASONE FUROATE-VILANTEROL 200-25 MCG/INH IN AEPB: 1.0000 | INHALATION_SPRAY | Freq: Every day | RESPIRATORY_TRACT | Status: DC

## 2014-12-28 NOTE — Telephone Encounter (Signed)
Spoke with pt. Aware RX has been sent in. Nothing further needed 

## 2014-12-30 DIAGNOSIS — N3281 Overactive bladder: Secondary | ICD-10-CM | POA: Insufficient documentation

## 2014-12-30 MED ORDER — FESOTERODINE FUMARATE ER 8 MG PO TB24: 8.0000 mg | ORAL_TABLET | Freq: Every day | ORAL | Status: DC

## 2014-12-30 NOTE — Telephone Encounter (Signed)
I sent a prescription to her pharmacy to treat this
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
## 2014-12-31 MED ORDER — FESOTERODINE FUMARATE ER 8 MG PO TB24: 8.0000 mg | ORAL_TABLET | Freq: Every day | ORAL | Status: DC

## 2014-12-31 NOTE — Telephone Encounter (Signed)
Patient called. Advised of below. She states that she lives in [place] now. The pharmacy is [facility] Address: [address] [place], [place] [address] Phone: [phone] This is now her permanent pharmacy

## 2014-12-31 NOTE — Telephone Encounter (Signed)
Resent to new pharmacy & updated pharmacy...[name]

## 2015-01-01 ENCOUNTER — Telehealth: Payer: Self-pay | Admitting: Internal Medicine

## 2015-01-01 NOTE — Telephone Encounter (Signed)
Attempted to call pt. Rang once and then line became disconnected   Will try to call again later

## 2015-01-02 ENCOUNTER — Ambulatory Visit: Payer: Self-pay | Admitting: Internal Medicine

## 2015-01-02 ENCOUNTER — Telehealth: Payer: Self-pay | Admitting: Internal Medicine

## 2015-01-02 NOTE — Telephone Encounter (Signed)
lmtcb x1 for pt. 

## 2015-01-02 NOTE — Telephone Encounter (Signed)
Spoke with pt. She reports she saw Dr. Ronnald Ramp on 12/06/14 and was told her thrush was gone from her tongue.  She wants [place] to send an order to where she is staying to d/c the nystatin mouth wash.  Also she wants [place] to write an order stating  She is able to use her inhalers on her own w/o help. Please advise Dr. Annamaria Boots thanks

## 2015-01-02 NOTE — Telephone Encounter (Signed)
yes

## 2015-01-02 NOTE — Telephone Encounter (Signed)
Ok for both

## 2015-01-02 NOTE — Telephone Encounter (Signed)
Is requesting a fax stating that patient can self administer medication.  Can fax to [phone]

## 2015-01-02 NOTE — Telephone Encounter (Signed)
Please advise if you agree.

## 2015-01-03 ENCOUNTER — Encounter: Payer: Self-pay | Admitting: Internal Medicine

## 2015-01-03 ENCOUNTER — Telehealth: Payer: Self-pay | Admitting: Internal Medicine

## 2015-01-03 ENCOUNTER — Ambulatory Visit (INDEPENDENT_AMBULATORY_CARE_PROVIDER_SITE_OTHER): Payer: Commercial Managed Care - HMO | Admitting: Internal Medicine

## 2015-01-03 VITALS — BP 138/70 | HR 92 | Ht 64.0 in | Wt 178.8 lb

## 2015-01-03 DIAGNOSIS — F450 Somatization disorder: Secondary | ICD-10-CM

## 2015-01-03 DIAGNOSIS — J4520 Mild intermittent asthma, uncomplicated: Secondary | ICD-10-CM | POA: Diagnosis not present

## 2015-01-03 DIAGNOSIS — F329 Major depressive disorder, single episode, unspecified: Secondary | ICD-10-CM

## 2015-01-03 DIAGNOSIS — IMO0001 Reserved for inherently not codable concepts without codable children: Secondary | ICD-10-CM

## 2015-01-03 DIAGNOSIS — K219 Gastro-esophageal reflux disease without esophagitis: Secondary | ICD-10-CM | POA: Diagnosis not present

## 2015-01-03 DIAGNOSIS — F32A Depression, unspecified: Secondary | ICD-10-CM

## 2015-01-03 MED ORDER — PREDNISONE 10 MG PO TABS: ORAL_TABLET | ORAL | Status: DC

## 2015-01-03 MED ORDER — SULFAMETHOXAZOLE-TRIMETHOPRIM 800-160 MG PO TABS: 1.0000 | ORAL_TABLET | Freq: Two times a day (BID) | ORAL | Status: DC

## 2015-01-03 NOTE — Telephone Encounter (Signed)
Called and spoke with patient Informed pt that per CY, he wants pt to come to office to be seen at either 11:30am or 3:30pm Pt states that she will have to find someone to take her to office and she would call office back to let us know what time  Will forward to [facility] and Dr Annamaria Boots as an Juluis Rainier
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
## 2015-01-03 NOTE — Telephone Encounter (Signed)
Per CY-have patient come in today;can use 11:15am slot or 3:30pm slot. Thanks

## 2015-01-03 NOTE — Assessment & Plan Note (Signed)
Significant component of her asthma complaints relates to her anxiety and depression.

## 2015-01-03 NOTE — Assessment & Plan Note (Signed)
Woke with wheeze. May be early flare, or she may already have it controlled. After discussion, I agreed to give smal amount for prednisone and antibiotic to hold. Discussed effects of steroids on her glucose.

## 2015-01-03 NOTE — Telephone Encounter (Signed)
Order written, signed, faxed and placed in CDY's scan folder  Spoke with pt and notified  Nothing further needed

## 2015-01-03 NOTE — Telephone Encounter (Signed)
Called spoke with pt. She is requesting to be seen. C/o wheezing x 4 am, feeling SOB, lots of prod cough (yellow phlem).  Please advise Dr. Annamaria Boots thanks  Allergies  Allergen Reactions  . Invokana [Canagliflozin] Other (See Comments)    Yeast infectios  . Losartan     Angioedema   . Metformin And Related Diarrhea  . Latex Other (See Comments)    Other Reaction: redness, blisters  . Morphine And Related Other (See Comments)    Pt. States med makes her crazy  . Other Other (See Comments)    Other Reaction: redness, blisters  . Oxycodone-Acetaminophen Itching    TYLOX. Caused internal itching per patient      Current Outpatient Prescriptions on File Prior to Visit  Medication Sig Dispense Refill  . albuterol (PROVENTIL HFA;VENTOLIN HFA) 108 (90 BASE) MCG/ACT inhaler Inhale 2 puffs into the lungs every 6 (six) hours as needed for wheezing or shortness of breath. 1 Inhaler 2  . albuterol (PROVENTIL) (2.5 MG/3ML) 0.083% nebulizer solution Take 3 mLs (2.5 mg total) by nebulization every 6 (six) hours as needed for wheezing or shortness of breath. 75 mL 12  . aspirin EC 81 MG tablet Take 81 mg by mouth every morning.     . fesoterodine (TOVIAZ) 8 MG TB24 tablet Take 1 tablet (8 mg total) by mouth daily. 30 tablet 11  . fexofenadine (ALLEGRA) 180 MG tablet Take 1 tablet (180 mg total) by mouth daily. 30 tablet 4  . Fluticasone Furoate-Vilanterol (BREO ELLIPTA) 200-25 MCG/INH AEPB Inhale 1 puff into the lungs daily. 1 each 3  . gabapentin (NEURONTIN) 300 MG capsule Take 1 tablet in the morning, 2 at night (Patient taking differently: Take 300-600 mg by mouth 2 (two) times daily. Take 1 tablet in the morning, 2 at night) 270 capsule 1  . glucose blood test strip Use TID  E11.9 100 each 12  . HYDROcodone-acetaminophen (NORCO) 10-325 MG per tablet Take 1 tablet by mouth every 6 (six) hours as needed for moderate pain. 75 tablet 0  . Insulin Glargine (TOUJEO SOLOSTAR) 300 UNIT/ML SOPN Inject 100  Units into the skin daily. 1.5 mL 11  . Insulin Lispro, Human, (HUMALOG KWIKPEN) 200 UNIT/ML SOPN Inject 10 Units into the skin 3 (three) times daily with meals. 9 mL 3  . Insulin Pen Needle (NOVOFINE) 30G X 8 MM MISC Use needle to inject insulin into the skin as directed. 200 each 2  . KLOR-CON M20 20 MEQ tablet Take 1 tablet (20 mEq total) by mouth once. 30 tablet 11  . levothyroxine (SYNTHROID, LEVOTHROID) 75 MCG tablet TAKE ONE TABLET BY MOUTH ONCE DAILY BEFORE BREAKFAST 90 tablet 2  . metoprolol succinate (TOPROL-XL) 50 MG 24 hr tablet Take 1 tablet (50 mg total) by mouth daily. Take with or immediately following a meal. 30 tablet 11  . Multiple Vitamins-Minerals (MULTIVITAMIN PO) Take 1 tablet by mouth daily.    Marland Kitchen nystatin (MYCOSTATIN) 100000 UNIT/ML suspension Take 5 mLs (500,000 Units total) by mouth 4 (four) times daily. 60 mL 5  . nystatin cream (MYCOSTATIN) Apply 1 application topically 2 (two) times daily. 30 g 5  . Omega-3 Fatty Acids (FISH OIL PO) Take 1 capsule by mouth daily.    . ondansetron (ZOFRAN ODT) 8 MG disintegrating tablet Take 1 tablet (8 mg total) by mouth every 8 (eight) hours as needed for nausea or vomiting. 20 tablet 0  . primidone (MYSOLINE) 50 MG tablet TAKE THREE TABLETS BY MOUTH TWICE  DAILY 180 tablet 3  . ranitidine (ZANTAC) 150 MG capsule Take 150 mg by mouth 2 (two) times daily.    . sitaGLIPtin (JANUVIA) 100 MG tablet Take 1 tablet (100 mg total) by mouth daily. 30 tablet 11  . tiZANidine (ZANAFLEX) 4 MG tablet Take 4 mg by mouth at bedtime as needed for muscle spasms.     Marland Kitchen triamterene-hydrochlorothiazide (DYAZIDE) 37.5-25 MG per capsule Take 1 capsule by mouth daily.  1  . Vilazodone HCl (VIIBRYD) 40 MG TABS Take 1 tablet (40 mg total) by mouth daily. 30 tablet 11   No current facility-administered medications on file prior to visit.

## 2015-01-03 NOTE — Telephone Encounter (Signed)
Patient sched for today at 11:15am per CY/KW, nothing further needed

## 2015-01-03 NOTE — Assessment & Plan Note (Signed)
Emphasized reflux precautions 

## 2015-01-03 NOTE — Patient Instructions (Signed)
Script sent for prednisone to take one daily. Don't take this until you really need it, since it will raise your sugars.  Script sent for sulfa drug antibiotic for your bronchitis

## 2015-01-04 ENCOUNTER — Telehealth: Payer: Self-pay | Admitting: Internal Medicine

## 2015-01-04 ENCOUNTER — Encounter (HOSPITAL_COMMUNITY): Payer: Self-pay | Admitting: Emergency Medicine

## 2015-01-04 ENCOUNTER — Emergency Department (HOSPITAL_COMMUNITY)
Admission: EM | Admit: 2015-01-04 | Discharge: 2015-01-04 | Disposition: A | Discharge status: Home / Self Care | Payer: Commercial Managed Care - HMO | Attending: Emergency Medicine | Admitting: Emergency Medicine

## 2015-01-04 ENCOUNTER — Emergency Department (HOSPITAL_COMMUNITY): Payer: Commercial Managed Care - HMO

## 2015-01-04 DIAGNOSIS — Z8739 Personal history of other diseases of the musculoskeletal system and connective tissue: Secondary | ICD-10-CM | POA: Diagnosis not present

## 2015-01-04 DIAGNOSIS — Z7952 Long term (current) use of systemic steroids: Secondary | ICD-10-CM | POA: Diagnosis not present

## 2015-01-04 DIAGNOSIS — F419 Anxiety disorder, unspecified: Secondary | ICD-10-CM | POA: Diagnosis not present

## 2015-01-04 DIAGNOSIS — Z87891 Personal history of nicotine dependence: Secondary | ICD-10-CM | POA: Insufficient documentation

## 2015-01-04 DIAGNOSIS — R251 Tremor, unspecified: Secondary | ICD-10-CM | POA: Diagnosis not present

## 2015-01-04 DIAGNOSIS — G40909 Epilepsy, unspecified, not intractable, without status epilepticus: Secondary | ICD-10-CM | POA: Diagnosis not present

## 2015-01-04 DIAGNOSIS — Z792 Long term (current) use of antibiotics: Secondary | ICD-10-CM | POA: Insufficient documentation

## 2015-01-04 DIAGNOSIS — E119 Type 2 diabetes mellitus without complications: Secondary | ICD-10-CM | POA: Insufficient documentation

## 2015-01-04 DIAGNOSIS — K219 Gastro-esophageal reflux disease without esophagitis: Secondary | ICD-10-CM | POA: Insufficient documentation

## 2015-01-04 DIAGNOSIS — G8929 Other chronic pain: Secondary | ICD-10-CM | POA: Diagnosis not present

## 2015-01-04 DIAGNOSIS — E039 Hypothyroidism, unspecified: Secondary | ICD-10-CM | POA: Insufficient documentation

## 2015-01-04 DIAGNOSIS — Z794 Long term (current) use of insulin: Secondary | ICD-10-CM | POA: Diagnosis not present

## 2015-01-04 DIAGNOSIS — R0789 Other chest pain: Secondary | ICD-10-CM | POA: Diagnosis not present

## 2015-01-04 DIAGNOSIS — Z79899 Other long term (current) drug therapy: Secondary | ICD-10-CM | POA: Diagnosis not present

## 2015-01-04 DIAGNOSIS — F329 Major depressive disorder, single episode, unspecified: Secondary | ICD-10-CM | POA: Diagnosis not present

## 2015-01-04 DIAGNOSIS — Z91040 Latex allergy status: Secondary | ICD-10-CM | POA: Diagnosis not present

## 2015-01-04 DIAGNOSIS — Z7982 Long term (current) use of aspirin: Secondary | ICD-10-CM | POA: Diagnosis not present

## 2015-01-04 DIAGNOSIS — Z7951 Long term (current) use of inhaled steroids: Secondary | ICD-10-CM | POA: Diagnosis not present

## 2015-01-04 DIAGNOSIS — I10 Essential (primary) hypertension: Secondary | ICD-10-CM | POA: Diagnosis not present

## 2015-01-04 DIAGNOSIS — J45909 Unspecified asthma, uncomplicated: Secondary | ICD-10-CM | POA: Insufficient documentation

## 2015-01-04 DIAGNOSIS — R079 Chest pain, unspecified: Secondary | ICD-10-CM | POA: Diagnosis present

## 2015-01-04 LAB — CBC
HCT: 36.6 % (ref 36.0–46.0)
Hemoglobin: 12.4 g/dL (ref 12.0–15.0)
MCH: 31.4 pg (ref 26.0–34.0)
MCHC: 33.9 g/dL (ref 30.0–36.0)
MCV: 92.7 fL (ref 78.0–100.0)
Platelets: 181 10*3/uL (ref 150–400)
RBC: 3.95 MIL/uL (ref 3.87–5.11)
RDW: 13.2 % (ref 11.5–15.5)
WBC: 7.5 10*3/uL (ref 4.0–10.5)

## 2015-01-04 LAB — COMPREHENSIVE METABOLIC PANEL
ALT: 33 U/L (ref 14–54)
AST: 30 U/L (ref 15–41)
Albumin: 3.8 g/dL (ref 3.5–5.0)
Alkaline Phosphatase: 72 U/L (ref 38–126)
Anion gap: 8 (ref 5–15)
BUN: 14 mg/dL (ref 6–20)
CO2: 28 mmol/L (ref 22–32)
Calcium: 8.8 mg/dL — ABNORMAL LOW (ref 8.9–10.3)
Chloride: 98 mmol/L — ABNORMAL LOW (ref 101–111)
Creatinine, Ser: 0.74 mg/dL (ref 0.44–1.00)
GFR calc Af Amer: >60 mL/min (ref >60)
GFR calc non Af Amer: >60 mL/min (ref >60)
Glucose, Bld: 243 mg/dL — ABNORMAL HIGH (ref 65–99)
Potassium: 4.3 mmol/L (ref 3.5–5.1)
Sodium: 134 mmol/L — ABNORMAL LOW (ref 135–145)
Total Bilirubin: 0.4 mg/dL (ref 0.3–1.2)
Total Protein: 6.9 g/dL (ref 6.5–8.1)

## 2015-01-04 LAB — MAGNESIUM: Magnesium: 2 mg/dL (ref 1.7–2.4)

## 2015-01-04 LAB — PROTIME-INR
INR: 0.99 (ref 0.00–1.49)
Prothrombin Time: 13.3 seconds (ref 11.6–15.2)

## 2015-01-04 LAB — TROPONIN I: Troponin I: <0.03 ng/mL (ref <0.031)

## 2015-01-04 MED ORDER — ASPIRIN 81 MG PO CHEW
324.0000 mg | CHEWABLE_TABLET | Freq: Once | ORAL | Status: DC
  Filled 2015-01-04: qty 4

## 2015-01-04 NOTE — ED Provider Notes (Signed)
CSN: [id number]     Arrival date & time 01/04/15  40 History   First MD Initiated Contact with Patient 01/04/15 1534     Chief Complaint  Patient presents with  . Chest Pain     (Consider location/radiation/quality/duration/timing/severity/associated sxs/prior Treatment) HPI  Patient presents after episode of chest pain. The patient recalls the relatively sudden onset of substernal chest pressure, after eating this morning. Symptoms lasted for possibly 2 hours, improved with rest, then with provision glycerin by EMS providers, pain was gone. Currently the patient has no chest pain, no complaints. Patient notes that she has been doing generally well at home, no recent other health issues. Patient acknowledges a history of reflux, states that the symptoms were somewhat similar, though more sustained, severe than usual.    Past Medical History  Diagnosis Date  . 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 [name], residual palpitations  . Vocal cord dysfunction   . Tremor, essential     takes Mysoline and Neurontin daily.  . Allergic rhinitis     uses Nasonex daily as needed  . Low sodium syndrome   . Chronic back pain     reason unknown  . Lumbar radiculopathy   . DDD (degenerative disc disease), lumbar   . Esophageal reflux     takes Zantac daily  . Hypertension     takes Losartan daily  . Anxiety     takes Ativan daily as needed  . Hypothyroidism     takes Synthroid daily  . Depression     takes Zoloft daily as well as Cymbalta  . Diabetes mellitus     takes NOvolin daily  . Asthma     Albuterol inhaler and Neb daily as needed  . History of bronchitis Dec 2014  . Seizures 04-05-13    one time ran out of Primidone and had stopped it cold [place]  . Weakness     in left arm  . Joint pain   . Joint swelling     left ankle  . Dysphagia   . Constipation   . Allergy to angiotensin receptor blockers (ARB) 01/22/2014   Angioedema  . 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 Rayann Heman 2007   Past Surgical History  Procedure Laterality Date  . Tonsillectomy    . Right breast cyst      benign  . Left ankle ligament repair      x 2  . Left elbow repair    . Partial hysterectomy    . Cholecystectomy    . Cesarean section    . Ablation for avnrt    . Colonoscopy  01/16/2004    YIF:OYDXAJ rectum/colon  . Esophagogastroduodenoscopy (egd) with esophageal dilation  04/03/2002    OIN:OMVEHMCN'O ring, otherwise normal esophagus, status post dilation with 54 F/Normal stomach  . Bartholin gland cyst excision      x 2  . Esophagogastroduodenoscopy (egd) with esophageal dilation N/A 11/08/2012    Procedure: ESOPHAGOGASTRODUODENOSCOPY (EGD) WITH ESOPHAGEAL DILATION;  Surgeon: Daneil Dolin, MD;  Location: [facility];  Service: Endoscopy;  Laterality: N/A;  11:30  . Subthalamic stimulator insertion Bilateral 12/04/2013    Procedure: Bilateral Deep brain stimulator placement;  Surgeon: [name], MD;  Location: [place];  Service: Neurosurgery;  Laterality: Bilateral;  Bilateral Deep brain stimulator placement  . Pulse generator implant Bilateral 12/15/2013    Procedure: BILATERAL PULSE GENERATOR IMPLANT;  Surgeon:  [name], MD;  Location: [place] [facility];  Service: Neurosurgery;  Laterality: Bilateral;  BILATERAL  . Maximum access (mas)posterior lumbar interbody fusion (plif) 1 level N/A 04/24/2014    Procedure: Lumbar four-five Maximum access Surgery posterior lumbar interbody fusion;  Surgeon: [name], MD;  Location: [place] [facility];  Service: Neurosurgery;  Laterality: N/A;  Lumbar four-five Maximum access Surgery posterior lumbar interbody fusion   Family History  Problem Relation Age of Onset  . Lung cancer Father     DIED AGE 5 LUNG CA  . Alcohol abuse Father   . Anxiety disorder Father   . Depression Father   . COPD Mother     DIED AGE 43,MRSA,COPD,PNEUMONIA  . Pneumonia Mother     DIED AGE  38,MRSA,COPD,PNEUMONIA  . Supraventricular tachycardia Mother   . Anxiety disorder Mother   . Depression Mother   . Alcohol abuse Mother   . COPD Brother     AGE 101  . Heart disease Sister     DIED AGE 18, SMOKER,?HEART  . Colon cancer Neg Hx    Social History  Substance Use Topics  . Smoking status: Former Smoker -- 0.30 packs/day for 10 years    Types: Cigarettes    Quit date: 04/23/1993  . Smokeless tobacco: Never Used     Comment: quit smoking 25+yrs ago  . Alcohol Use: No     Comment: no alcohol in 38yrs   OB History    No data available     Review of Systems  Constitutional:       Per HPI, otherwise negative  HENT:       Per HPI, otherwise negative  Respiratory:       Per HPI, otherwise negative  Cardiovascular:       Per HPI, otherwise negative  Gastrointestinal: Negative for vomiting.  Endocrine:       Negative aside from HPI  Genitourinary:       Neg aside from HPI   Musculoskeletal:       Per HPI, otherwise negative  Skin: Negative.   Neurological: Negative for syncope.       Baseline tremor, deep brain stimulator  Psychiatric/Behavioral: The patient is nervous/anxious.       Allergies  Invokana; Losartan; Metformin and related; Latex; Morphine and related; Other; and Oxycodone-acetaminophen  Home Medications   Prior to Admission medications   Medication Sig Start Date End Date Taking? Authorizing Provider  albuterol (PROVENTIL HFA;VENTOLIN HFA) 108 (90 BASE) MCG/ACT inhaler Inhale 2 puffs into the lungs every 6 (six) hours as needed for wheezing or shortness of breath. 11/26/14   Ezequiel Essex, MD  albuterol (PROVENTIL) (2.5 MG/3ML) 0.083% nebulizer solution Take 3 mLs (2.5 mg total) by nebulization every 6 (six) hours as needed for wheezing or shortness of breath. 11/28/14   Deneise Lever, MD  aspirin EC 81 MG tablet Take 81 mg by mouth every morning.     Historical Provider, MD  fesoterodine (TOVIAZ) 8 MG TB24 tablet Take 1 tablet (8 mg total)  by mouth daily. 12/31/14   Janith Lima, MD  fexofenadine (ALLEGRA) 180 MG tablet Take 1 tablet (180 mg total) by mouth daily. 12/06/14   Deneise Lever, MD  Fluticasone Furoate-Vilanterol (BREO ELLIPTA) 200-25 MCG/INH AEPB Inhale 1 puff into the lungs daily. 12/28/14   Deneise Lever, MD  gabapentin (NEURONTIN) 300 MG capsule Take 1 tablet in the morning, 2 at night Patient taking differently: Take 300-600 mg by mouth 2 (two)  times daily. Take 1 tablet in the morning, 2 at night 08/29/14   Eustace Quail Tat, DO  glucose blood test strip Use TID  E11.9 04/10/14   Biagio Borg, MD  HYDROcodone-acetaminophen [facility]) 10-325 MG per tablet Take 1 tablet by mouth every 6 (six) hours as needed for moderate pain. 12/25/14   Janith Lima, MD  Insulin Glargine (TOUJEO SOLOSTAR) 300 UNIT/ML SOPN Inject 100 Units into the skin daily. 12/05/14   Janith Lima, MD  Insulin Lispro, Human, (HUMALOG KWIKPEN) 200 UNIT/ML SOPN Inject 10 Units into the skin 3 (three) times daily with meals. 12/05/14   Janith Lima, MD  Insulin Pen Needle (NOVOFINE) 30G X 8 MM MISC Use needle to inject insulin into the skin as directed. 12/25/14   Janith Lima, MD  KLOR-CON M20 20 MEQ tablet Take 1 tablet (20 mEq total) by mouth once. 11/21/14   Janith Lima, MD  levothyroxine (SYNTHROID, LEVOTHROID) 75 MCG tablet TAKE ONE TABLET BY MOUTH ONCE DAILY BEFORE BREAKFAST 08/29/14   Janith Lima, MD  metoprolol succinate (TOPROL-XL) 50 MG 24 hr tablet Take 1 tablet (50 mg total) by mouth daily. Take with or immediately following a meal. 11/06/14   Janith Lima, MD  Multiple Vitamins-Minerals (MULTIVITAMIN PO) Take 1 tablet by mouth daily.    Historical Provider, MD  nystatin (MYCOSTATIN) 100000 UNIT/ML suspension Take 5 mLs (500,000 Units total) by mouth 4 (four) times daily. Patient not taking: Reported on 01/03/2015 12/06/14   Janith Lima, MD  nystatin cream (MYCOSTATIN) Apply 1 application topically 2 (two) times daily. 12/06/14   Janith Lima, MD  Omega-3 Fatty Acids (FISH OIL PO) Take 1 capsule by mouth daily.    Historical Provider, MD  ondansetron (ZOFRAN ODT) 8 MG disintegrating tablet Take 1 tablet (8 mg total) by mouth every 8 (eight) hours as needed for nausea or vomiting. Patient not taking: Reported on 01/03/2015 11/24/14   Evalee Jefferson, PA-C  predniSONE (DELTASONE) 10 MG tablet 1 daily as directed 01/03/15   Deneise Lever, MD  primidone (MYSOLINE) 50 MG tablet TAKE THREE TABLETS BY MOUTH TWICE DAILY 07/30/14   Janith Lima, MD  ranitidine (ZANTAC) 150 MG capsule Take 150 mg by mouth 2 (two) times daily.    Historical Provider, MD  sitaGLIPtin (JANUVIA) 100 MG tablet Take 1 tablet (100 mg total) by mouth daily. 12/05/14   Janith Lima, MD  sulfamethoxazole-trimethoprim (BACTRIM DS,SEPTRA DS) 800-160 MG tablet Take 1 tablet by mouth 2 (two) times daily. 01/03/15   Deneise Lever, MD  tiZANidine (ZANAFLEX) 4 MG tablet Take 4 mg by mouth at bedtime as needed for muscle spasms.     Historical Provider, MD  triamterene-hydrochlorothiazide (DYAZIDE) 37.5-25 MG per capsule Take 1 capsule by mouth daily. 08/30/14   Historical Provider, MD  Vilazodone HCl (VIIBRYD) 40 MG TABS Take 1 tablet (40 mg total) by mouth daily. 11/21/14   Janith Lima, MD   BP 96/63 mmHg  Pulse 91  Temp(Src) 97.8 F (36.6 C) (Oral)  Resp 12  Ht 5\' 4"  (1.626 m)  Wt 178 lb (80.74 kg)  BMI 30.54 kg/m2  SpO2 99% Physical Exam  Constitutional: She is oriented to person, place, and time. She appears well-developed and well-nourished. No distress.  HENT:  Head: Normocephalic and atraumatic.  Eyes: Conjunctivae and EOM are normal.  Cardiovascular: Normal rate and regular rhythm.   Pulmonary/Chest: Effort normal and breath sounds normal. No stridor.  No respiratory distress.  Abdominal: She exhibits no distension.  Musculoskeletal: She exhibits no edema.  Neurological: She is alert and oriented to person, place, and time. She displays tremor. No cranial  nerve deficit.  Skin: Skin is warm and dry.  Psychiatric: She has a normal mood and affect.  Nursing note and vitals reviewed.   ED Course  Procedures (including critical care time) Labs Review Labs Reviewed  COMPREHENSIVE METABOLIC PANEL - Abnormal; Notable for the following:    Sodium 134 (*)    Chloride 98 (*)    Glucose, Bld 243 (*)    Calcium 8.8 (*)    All other components within normal limits  CBC  MAGNESIUM  PROTIME-INR  TROPONIN I    Imaging Review Dg Chest 2 View  01/04/2015   CLINICAL DATA:  Chest pain  EXAM: CHEST  2 VIEW  COMPARISON:  December 13, 2014  FINDINGS: Stimulator devices are noted overlying each lung apex. There is no edema or consolidation. Heart size and pulmonary vascularity are normal. No adenopathy. No pneumothorax. No bone lesions.  IMPRESSION: No edema or consolidation.   Electronically Signed   By: Lowella Grip III M.D.   On: 01/04/2015 16:28   I have personally reviewed and evaluated these images and lab results as part of my medical decision-making.   EKG Interpretation   Date/Time:  Friday January 04 2015 15:37:57 EDT Ventricular Rate:  89 PR Interval:  164 QRS Duration: 83 QT Interval:  359 QTC Calculation: 437 R Axis:   69 Text Interpretation:  Sinus rhythm Baseline wander in lead(s) V6 Sinus  rhythm Artifact Non-specific intra-ventricular conduction delay Borderline  ECG Confirmed by Carmin Muskrat  MD [phone]) on 01/04/2015 3:52:04 PM     On repeat exam the patient is in no distress. Patient is awake, alert. We discussed all findings at length. No new complaints.  Chart review demonstrates multiple prior visits for similar concerns, with similar reassuring findings.   MDM  Elderly female with COPD presents from home after an episode of sternal discomfort. Here the patient is awake, alert, hemodynamically stable, neurologically intact, with no evidence for decompensation. Labs reassuring, with low suspicion for ongoing  coronary ischemia. No evidence for pneumonia, pneumothorax. Discharged stable condition to follow-up with primary care.   Carmin Muskrat, MD 01/04/15 [phone]

## 2015-01-04 NOTE — ED Notes (Signed)
Patient arrives via EMS from home with c/o chest pain, substernal that started this morning. Denies Radiation of pain. NSR on monitor. CBG 224. ASA 324 mg and 1 sl nitro.

## 2015-01-04 NOTE — ED Notes (Signed)
Pt states understanding of care given and follow up instructions.  Was taken to waiting room in wheel chair to wait for her ride to arrive

## 2015-01-04 NOTE — Telephone Encounter (Signed)
Letter printed, stamped and faxed.

## 2015-01-04 NOTE — ED Notes (Signed)
Pt placed on bedpan

## 2015-01-04 NOTE — Discharge Instructions (Signed)
As discussed, your evaluation today has been largely reassuring.  But, it is important that you monitor your condition carefully, and do not hesitate to return to the ED if you develop new, or concerning changes in your condition. ° °Otherwise, please follow-up with your physician for appropriate ongoing care. ° °Chest Pain (Nonspecific) °It is often hard to give a diagnosis for the cause of chest pain. There is always a chance that your pain could be related to something serious, such as a heart attack or a blood clot in the lungs. You need to follow up with your doctor. °HOME CARE °· If antibiotic medicine was given, take it as directed by your doctor. Finish the medicine even if you start to feel better. °· For the next few days, avoid activities that bring on chest pain. Continue physical activities as told by your doctor. °· Do not use any tobacco products. This includes cigarettes, chewing tobacco, and e-cigarettes. °· Avoid drinking alcohol. °· Only take medicine as told by your doctor. °· Follow your doctor's suggestions for more testing if your chest pain does not go away. °· Keep all doctor visits you made. °GET HELP IF: °· Your chest pain does not go away, even after treatment. °· You have a rash with blisters on your chest. °· You have a fever. °GET HELP RIGHT AWAY IF:  °· You have more pain or pain that spreads to your arm, neck, jaw, back, or belly (abdomen). °· You have shortness of breath. °· You cough more than usual or cough up blood. °· You have very bad back or belly pain. °· You feel sick to your stomach (nauseous) or throw up (vomit). °· You have very bad weakness. °· You pass out (faint). °· You have chills. °This is an emergency. Do not wait to see if the problems will go away. Call your local emergency services (911 in [place] Do not drive yourself to the hospital. °MAKE SURE YOU:  °· Understand these instructions. °· Will watch your condition. °· Will get help right away if you are not doing  well or get worse. °Document Released: 09/09/2007 Document Revised: 03/28/2013 Document Reviewed: 09/09/2007 °[employer]® Patient Information ©2015 [employer]. This information is not intended to replace advice given to you by your health care provider. Make sure you discuss any questions you have with your health care provider. ° °

## 2015-01-04 NOTE — Telephone Encounter (Signed)
Spoke with the pt  She states that her prednisone came in a "bubble pack" rather than a bottle  We sent 10 mg tablets to pharm  I advised her to just take 1 daily ONLY AS NEEDED per CDY  She verbalized understanding  Nothing further needed

## 2015-01-04 NOTE — ED Notes (Signed)
Patient given Diet Coke per MD approval.

## 2015-01-07 ENCOUNTER — Telehealth: Payer: Self-pay | Admitting: Internal Medicine

## 2015-01-07 ENCOUNTER — Encounter (HOSPITAL_COMMUNITY): Payer: Self-pay | Admitting: Emergency Medicine

## 2015-01-07 ENCOUNTER — Emergency Department (HOSPITAL_COMMUNITY)
Admission: EM | Admit: 2015-01-07 | Discharge: 2015-01-07 | Disposition: A | Discharge status: Home / Self Care | Payer: Commercial Managed Care - HMO | Attending: Emergency Medicine | Admitting: Emergency Medicine

## 2015-01-07 DIAGNOSIS — G8929 Other chronic pain: Secondary | ICD-10-CM | POA: Insufficient documentation

## 2015-01-07 DIAGNOSIS — Z7982 Long term (current) use of aspirin: Secondary | ICD-10-CM | POA: Diagnosis not present

## 2015-01-07 DIAGNOSIS — R0789 Other chest pain: Secondary | ICD-10-CM | POA: Diagnosis not present

## 2015-01-07 DIAGNOSIS — Z794 Long term (current) use of insulin: Secondary | ICD-10-CM | POA: Diagnosis not present

## 2015-01-07 DIAGNOSIS — R1013 Epigastric pain: Secondary | ICD-10-CM | POA: Diagnosis not present

## 2015-01-07 DIAGNOSIS — I10 Essential (primary) hypertension: Secondary | ICD-10-CM | POA: Insufficient documentation

## 2015-01-07 DIAGNOSIS — Z87891 Personal history of nicotine dependence: Secondary | ICD-10-CM | POA: Diagnosis not present

## 2015-01-07 DIAGNOSIS — F419 Anxiety disorder, unspecified: Secondary | ICD-10-CM | POA: Insufficient documentation

## 2015-01-07 DIAGNOSIS — Z7951 Long term (current) use of inhaled steroids: Secondary | ICD-10-CM | POA: Insufficient documentation

## 2015-01-07 DIAGNOSIS — E119 Type 2 diabetes mellitus without complications: Secondary | ICD-10-CM | POA: Diagnosis not present

## 2015-01-07 DIAGNOSIS — E039 Hypothyroidism, unspecified: Secondary | ICD-10-CM | POA: Diagnosis not present

## 2015-01-07 DIAGNOSIS — J45909 Unspecified asthma, uncomplicated: Secondary | ICD-10-CM | POA: Insufficient documentation

## 2015-01-07 DIAGNOSIS — Z8739 Personal history of other diseases of the musculoskeletal system and connective tissue: Secondary | ICD-10-CM | POA: Insufficient documentation

## 2015-01-07 DIAGNOSIS — G40909 Epilepsy, unspecified, not intractable, without status epilepticus: Secondary | ICD-10-CM | POA: Insufficient documentation

## 2015-01-07 DIAGNOSIS — Z7952 Long term (current) use of systemic steroids: Secondary | ICD-10-CM | POA: Diagnosis not present

## 2015-01-07 DIAGNOSIS — F329 Major depressive disorder, single episode, unspecified: Secondary | ICD-10-CM | POA: Insufficient documentation

## 2015-01-07 DIAGNOSIS — Z79899 Other long term (current) drug therapy: Secondary | ICD-10-CM | POA: Diagnosis not present

## 2015-01-07 DIAGNOSIS — Z91040 Latex allergy status: Secondary | ICD-10-CM | POA: Insufficient documentation

## 2015-01-07 DIAGNOSIS — R079 Chest pain, unspecified: Secondary | ICD-10-CM | POA: Diagnosis present

## 2015-01-07 DIAGNOSIS — K219 Gastro-esophageal reflux disease without esophagitis: Secondary | ICD-10-CM | POA: Insufficient documentation

## 2015-01-07 DIAGNOSIS — Z792 Long term (current) use of antibiotics: Secondary | ICD-10-CM | POA: Insufficient documentation

## 2015-01-07 LAB — I-STAT CHEM 8, ED
BUN: 11 mg/dL (ref 6–20)
Calcium, Ion: 1.17 mmol/L (ref 1.13–1.30)
Chloride: 99 mmol/L — ABNORMAL LOW (ref 101–111)
Creatinine, Ser: 0.8 mg/dL (ref 0.44–1.00)
Glucose, Bld: 247 mg/dL — ABNORMAL HIGH (ref 65–99)
HCT: 39 % (ref 36.0–46.0)
Hemoglobin: 13.3 g/dL (ref 12.0–15.0)
Potassium: 3.8 mmol/L (ref 3.5–5.1)
Sodium: 137 mmol/L (ref 135–145)
TCO2: 25 mmol/L (ref 0–100)

## 2015-01-07 LAB — I-STAT TROPONIN, ED: Troponin i, poc: 0 ng/mL (ref 0.00–0.08)

## 2015-01-07 MED ORDER — GI COCKTAIL ~~LOC~~
30.0000 mL | Freq: Once | ORAL | Status: AC
  Administered 2015-01-07: 30 mL via ORAL
  Filled 2015-01-07: qty 30

## 2015-01-07 MED ORDER — PROMETHAZINE-CODEINE 6.25-10 MG/5ML PO SYRP: 5.0000 mL | ORAL_SOLUTION | Freq: Four times a day (QID) | ORAL | Status: AC | PRN

## 2015-01-07 NOTE — Telephone Encounter (Signed)
Called and spoke to pt. Informed her of the rx per CY. Rx called into preferred pharmacy. Pt verbalized understanding and denied any further questions or concerns at this time.

## 2015-01-07 NOTE — ED Notes (Signed)
Pt placed on bedpan and urinated, pt off bedpan at this time.

## 2015-01-07 NOTE — Telephone Encounter (Signed)
Attempted to contact patient, line busy. wcb

## 2015-01-07 NOTE — ED Notes (Signed)
Pt made aware to return if symptoms worsen or if any life threatening symptoms occur.   

## 2015-01-07 NOTE — Telephone Encounter (Signed)
Offer prometh codeine cough syrup    200 ml,  5 ml every 6 hours prn cough

## 2015-01-07 NOTE — ED Notes (Signed)
MD at bedside. 

## 2015-01-07 NOTE — ED Provider Notes (Signed)
CSN: [id number]     Arrival date & time 01/07/15  1534 History   First MD Initiated Contact with Patient 01/07/15 1540     Chief Complaint  Patient presents with  . Chest Pain     (Consider location/radiation/quality/duration/timing/severity/associated sxs/prior Treatment) Patient is a 64 y.o. female presenting with chest pain. The history is provided by the patient.  Chest Pain Pain location:  Epigastric Pain quality: sharp   Pain radiates to:  Does not radiate Pain radiates to the back: no   Pain severity:  Mild Onset quality:  Gradual Timing:  Intermittent Progression:  Waxing and waning Chronicity:  Recurrent Context: eating   Relieved by:  Nothing Worsened by:  Nothing tried Ineffective treatments:  None tried Associated symptoms: no cough, no fever, no shortness of breath and not vomiting   Risk factors: diabetes mellitus and hypertension   Risk factors: no high cholesterol and no smoking     Past Medical History  Diagnosis Date  . 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 [name], residual palpitations  . Vocal cord dysfunction   . Tremor, essential     takes Mysoline and Neurontin daily.  . Allergic rhinitis     uses Nasonex daily as needed  . Low sodium syndrome   . Chronic back pain     reason unknown  . Lumbar radiculopathy   . DDD (degenerative disc disease), lumbar   . Esophageal reflux     takes Zantac daily  . Hypertension     takes Losartan daily  . Anxiety     takes Ativan daily as needed  . Hypothyroidism     takes Synthroid daily  . Depression     takes Zoloft daily as well as Cymbalta  . Diabetes mellitus     takes NOvolin daily  . Asthma     Albuterol inhaler and Neb daily as needed  . History of bronchitis Dec 2014  . Seizures ([place]) 04-05-13    one time ran out of Primidone and had stopped it cold [place]  . Weakness     in left arm  . Joint pain   . Joint swelling     left ankle  .  Dysphagia   . Constipation   . Allergy to angiotensin receptor blockers (ARB) 01/22/2014    Angioedema  . 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 Rayann Heman 2007   Past Surgical History  Procedure Laterality Date  . Tonsillectomy    . Right breast cyst      benign  . Left ankle ligament repair      x 2  . Left elbow repair    . Partial hysterectomy    . Cholecystectomy    . Cesarean section    . Ablation for avnrt    . Colonoscopy  01/16/2004    WNI:OEVOJJ rectum/colon  . Esophagogastroduodenoscopy (egd) with esophageal dilation  04/03/2002    [id number] ring, otherwise normal esophagus, status post dilation with 56 F/Normal stomach  . Bartholin gland cyst excision      x 2  . Esophagogastroduodenoscopy (egd) with esophageal dilation N/A 11/08/2012    Procedure: ESOPHAGOGASTRODUODENOSCOPY (EGD) WITH ESOPHAGEAL DILATION;  Surgeon: Daneil Dolin, MD;  [facility];  Service: Endoscopy;  Laterality: N/A;  11:30  . Subthalamic stimulator insertion Bilateral 12/04/2013    Procedure: Bilateral Deep brain stimulator placement;  Surgeon: [name], MD;  Location: [facility]  ORS;  Service: Neurosurgery;  Laterality: Bilateral;  Bilateral Deep brain stimulator placement  . Pulse generator implant Bilateral 12/15/2013    Procedure: BILATERAL PULSE GENERATOR IMPLANT;  Surgeon: [name], MD;  Location: [place] [facility];  Service: Neurosurgery;  Laterality: Bilateral;  BILATERAL  . Maximum access (mas)posterior lumbar interbody fusion (plif) 1 level N/A 04/24/2014    Procedure: Lumbar four-five Maximum access Surgery posterior lumbar interbody fusion;  Surgeon: [name], MD;  Location: [place] [facility] ORS;  Service: Neurosurgery;  Laterality: N/A;  Lumbar four-five Maximum access Surgery posterior lumbar interbody fusion   Family History  Problem Relation Age of Onset  . Lung cancer Father     DIED AGE 11 LUNG CA  . Alcohol abuse Father   . Anxiety disorder Father   . Depression  Father   . COPD Mother     DIED AGE 15,MRSA,COPD,PNEUMONIA  . Pneumonia Mother     DIED AGE 15,MRSA,COPD,PNEUMONIA  . Supraventricular tachycardia Mother   . Anxiety disorder Mother   . Depression Mother   . Alcohol abuse Mother   . COPD Brother     AGE 14  . Heart disease Sister     DIED AGE 34, SMOKER,?HEART  . Colon cancer Neg Hx    Social History  Substance Use Topics  . Smoking status: Former Smoker -- 0.30 packs/day for 10 years    Types: Cigarettes    Quit date: 04/23/1993  . Smokeless tobacco: Never Used     Comment: quit smoking 25+yrs ago  . Alcohol Use: No     Comment: no alcohol in 59yrs   OB History    No data available     Review of Systems  Constitutional: Negative for fever.  Respiratory: Negative for cough and shortness of breath.   Cardiovascular: Positive for chest pain.  Gastrointestinal: Negative for vomiting.  All other systems reviewed and are negative.     Allergies  Invokana; Losartan; Metformin and related; Latex; Morphine and related; Other; and Oxycodone-acetaminophen  Home Medications   Prior to Admission medications   Medication Sig Start Date End Date Taking? Authorizing Provider  albuterol (PROVENTIL HFA;VENTOLIN HFA) 108 (90 BASE) MCG/ACT inhaler Inhale 2 puffs into the lungs every 6 (six) hours as needed for wheezing or shortness of breath. 11/26/14   Ezequiel Essex, MD  albuterol (PROVENTIL) (2.5 MG/3ML) 0.083% nebulizer solution Take 3 mLs (2.5 mg total) by nebulization every 6 (six) hours as needed for wheezing or shortness of breath. 11/28/14   Deneise Lever, MD  aspirin EC 81 MG tablet Take 81 mg by mouth every morning.     Historical Provider, MD  fesoterodine (TOVIAZ) 8 MG TB24 tablet Take 1 tablet (8 mg total) by mouth daily. 12/31/14   Janith Lima, MD  fexofenadine (ALLEGRA) 180 MG tablet Take 1 tablet (180 mg total) by mouth daily. 12/06/14   Deneise Lever, MD  Fluticasone Furoate-Vilanterol (BREO ELLIPTA) 200-25  MCG/INH AEPB Inhale 1 puff into the lungs daily. 12/28/14   Deneise Lever, MD  gabapentin (NEURONTIN) 300 MG capsule Take 1 tablet in the morning, 2 at night Patient taking differently: Take 300-600 mg by mouth 2 (two) times daily. Take 1 tablet in the morning, 2 at night 08/29/14   Eustace Quail Tat, DO  glucose blood test strip Use TID  E11.9 04/10/14   Biagio Borg, MD  HYDROcodone-acetaminophen [facility]) 10-325 MG per tablet Take 1 tablet by mouth every 6 (six) hours as needed for moderate  pain. 12/25/14   Janith Lima, MD  Insulin Glargine (TOUJEO SOLOSTAR) 300 UNIT/ML SOPN Inject 100 Units into the skin daily. 12/05/14   Janith Lima, MD  Insulin Lispro, Human, (HUMALOG KWIKPEN) 200 UNIT/ML SOPN Inject 10 Units into the skin 3 (three) times daily with meals. 12/05/14   Janith Lima, MD  Insulin Pen Needle (NOVOFINE) 30G X 8 MM MISC Use needle to inject insulin into the skin as directed. 12/25/14   Janith Lima, MD  KLOR-CON M20 20 MEQ tablet Take 1 tablet (20 mEq total) by mouth once. 11/21/14   Janith Lima, MD  levothyroxine (SYNTHROID, LEVOTHROID) 75 MCG tablet TAKE ONE TABLET BY MOUTH ONCE DAILY BEFORE BREAKFAST 08/29/14   Janith Lima, MD  metoprolol succinate (TOPROL-XL) 50 MG 24 hr tablet Take 1 tablet (50 mg total) by mouth daily. Take with or immediately following a meal. 11/06/14   Janith Lima, MD  Multiple Vitamins-Minerals (MULTIVITAMIN PO) Take 1 tablet by mouth daily.    Historical Provider, MD  nystatin (MYCOSTATIN) 100000 UNIT/ML suspension Take 5 mLs (500,000 Units total) by mouth 4 (four) times daily. Patient not taking: Reported on 01/03/2015 12/06/14   Janith Lima, MD  nystatin cream (MYCOSTATIN) Apply 1 application topically 2 (two) times daily. 12/06/14   Janith Lima, MD  Omega-3 Fatty Acids (FISH OIL PO) Take 1 capsule by mouth daily.    Historical Provider, MD  ondansetron (ZOFRAN ODT) 8 MG disintegrating tablet Take 1 tablet (8 mg total) by mouth every 8 (eight) hours  as needed for nausea or vomiting. Patient not taking: Reported on 01/03/2015 11/24/14   Evalee Jefferson, PA-C  predniSONE (DELTASONE) 10 MG tablet 1 daily as directed 01/03/15   Deneise Lever, MD  primidone (MYSOLINE) 50 MG tablet TAKE THREE TABLETS BY MOUTH TWICE DAILY 07/30/14   Janith Lima, MD  promethazine-codeine [facility] WITH CODEINE) 6.25-10 MG/5ML syrup Take 5 mLs by mouth every 6 (six) hours as needed for cough. 01/07/15 01/17/15  Deneise Lever, MD  ranitidine (ZANTAC) 150 MG capsule Take 150 mg by mouth 2 (two) times daily.    Historical Provider, MD  sitaGLIPtin (JANUVIA) 100 MG tablet Take 1 tablet (100 mg total) by mouth daily. 12/05/14   Janith Lima, MD  sulfamethoxazole-trimethoprim (BACTRIM DS,SEPTRA DS) 800-160 MG tablet Take 1 tablet by mouth 2 (two) times daily. 01/03/15   Deneise Lever, MD  tiZANidine (ZANAFLEX) 4 MG tablet Take 4 mg by mouth at bedtime as needed for muscle spasms.     Historical Provider, MD  triamterene-hydrochlorothiazide (DYAZIDE) 37.5-25 MG per capsule Take 1 capsule by mouth daily. 08/30/14   Historical Provider, MD  Vilazodone HCl (VIIBRYD) 40 MG TABS Take 1 tablet (40 mg total) by mouth daily. 11/21/14   Janith Lima, MD   BP 157/92 mmHg  Pulse 95  Temp(Src) 98.2 F (36.8 C)  Resp 13  Ht 5\' 4"  (1.626 m)  Wt 179 lb (81.194 kg)  BMI 30.71 kg/m2  SpO2 99% Physical Exam  Constitutional: She is oriented to person, place, and time. She appears well-developed and well-nourished. No distress.  HENT:  Head: Normocephalic.  Eyes: Conjunctivae are normal.  Neck: Neck supple. No tracheal deviation present.  Cardiovascular: Normal rate, regular rhythm and normal heart sounds.   Pulmonary/Chest: Effort normal and breath sounds normal. No respiratory distress. She has no wheezes. She has no rales. She exhibits tenderness (at xyphoid).  Abdominal: Soft. She exhibits no  distension. There is no tenderness.  Neurological: She is alert and oriented to person,  place, and time.  Skin: Skin is warm and dry.  Psychiatric: She has a normal mood and affect.    ED Course  Procedures (including critical care time) Labs Review Labs Reviewed  I-STAT CHEM 8, ED - Abnormal; Notable for the following:    Chloride 99 (*)    Glucose, Bld 247 (*)    All other components within normal limits  I-STAT TROPOININ, ED    Imaging Review No results found. I have personally reviewed and evaluated these images and lab results as part of my medical decision-making.   EKG Interpretation   Date/Time:  Monday January 07 2015 15:37:40 EDT Ventricular Rate:  90 PR Interval:  160 QRS Duration: 93 QT Interval:  374 QTC Calculation: 458 R Axis:   80 Text Interpretation:  Sinus rhythm Normal ECG Confirmed by Trustin Chapa MD,  Quillian Quince ([id number]) on 01/07/2015 3:41:00 PM      MDM   Final diagnoses:  Chest wall pain    64 year old female with history of COPD presents with lower chest pain that is central, sharp, atypical for ACS, reproducible on exam, made worse with movement and coughing. Wells score is 0. Troponin negative, recently had chest x-ray that was unremarkable for same ongoing symptoms. Requesting GI cocktail for what she feels is ongoing reflux pain. Do not feel repeat x-rays warranted today. Plan to follow up with PCP as needed and return precautions discussed for worsening or new concerning symptoms.     Leo Grosser, MD 01/07/15 [phone]

## 2015-01-07 NOTE — Telephone Encounter (Signed)
Patient calling to get refill on cough syrup. Patient last seen 01/03/15 Next OV 05/29/15  Ok to refill?  Current Outpatient Prescriptions on File Prior to Visit  Medication Sig Dispense Refill  . albuterol (PROVENTIL HFA;VENTOLIN HFA) 108 (90 BASE) MCG/ACT inhaler Inhale 2 puffs into the lungs every 6 (six) hours as needed for wheezing or shortness of breath. 1 Inhaler 2  . albuterol (PROVENTIL) (2.5 MG/3ML) 0.083% nebulizer solution Take 3 mLs (2.5 mg total) by nebulization every 6 (six) hours as needed for wheezing or shortness of breath. 75 mL 12  . aspirin EC 81 MG tablet Take 81 mg by mouth every morning.     . fesoterodine (TOVIAZ) 8 MG TB24 tablet Take 1 tablet (8 mg total) by mouth daily. 30 tablet 11  . fexofenadine (ALLEGRA) 180 MG tablet Take 1 tablet (180 mg total) by mouth daily. 30 tablet 4  . Fluticasone Furoate-Vilanterol (BREO ELLIPTA) 200-25 MCG/INH AEPB Inhale 1 puff into the lungs daily. 1 each 3  . gabapentin (NEURONTIN) 300 MG capsule Take 1 tablet in the morning, 2 at night (Patient taking differently: Take 300-600 mg by mouth 2 (two) times daily. Take 1 tablet in the morning, 2 at night) 270 capsule 1  . glucose blood test strip Use TID  E11.9 100 each 12  . HYDROcodone-acetaminophen (NORCO) 10-325 MG per tablet Take 1 tablet by mouth every 6 (six) hours as needed for moderate pain. 75 tablet 0  . Insulin Glargine (TOUJEO SOLOSTAR) 300 UNIT/ML SOPN Inject 100 Units into the skin daily. 1.5 mL 11  . Insulin Lispro, Human, (HUMALOG KWIKPEN) 200 UNIT/ML SOPN Inject 10 Units into the skin 3 (three) times daily with meals. 9 mL 3  . Insulin Pen Needle (NOVOFINE) 30G X 8 MM MISC Use needle to inject insulin into the skin as directed. 200 each 2  . KLOR-CON M20 20 MEQ tablet Take 1 tablet (20 mEq total) by mouth once. 30 tablet 11  . levothyroxine (SYNTHROID, LEVOTHROID) 75 MCG tablet TAKE ONE TABLET BY MOUTH ONCE DAILY BEFORE BREAKFAST 90 tablet 2  . metoprolol succinate  (TOPROL-XL) 50 MG 24 hr tablet Take 1 tablet (50 mg total) by mouth daily. Take with or immediately following a meal. 30 tablet 11  . Multiple Vitamins-Minerals (MULTIVITAMIN PO) Take 1 tablet by mouth daily.    Marland Kitchen nystatin (MYCOSTATIN) 100000 UNIT/ML suspension Take 5 mLs (500,000 Units total) by mouth 4 (four) times daily. (Patient not taking: Reported on 01/03/2015) 60 mL 5  . nystatin cream (MYCOSTATIN) Apply 1 application topically 2 (two) times daily. 30 g 5  . Omega-3 Fatty Acids (FISH OIL PO) Take 1 capsule by mouth daily.    . ondansetron (ZOFRAN ODT) 8 MG disintegrating tablet Take 1 tablet (8 mg total) by mouth every 8 (eight) hours as needed for nausea or vomiting. (Patient not taking: Reported on 01/03/2015) 20 tablet 0  . predniSONE (DELTASONE) 10 MG tablet 1 daily as directed 6 tablet 0  . primidone (MYSOLINE) 50 MG tablet TAKE THREE TABLETS BY MOUTH TWICE DAILY 180 tablet 3  . ranitidine (ZANTAC) 150 MG capsule Take 150 mg by mouth 2 (two) times daily.    . sitaGLIPtin (JANUVIA) 100 MG tablet Take 1 tablet (100 mg total) by mouth daily. 30 tablet 11  . sulfamethoxazole-trimethoprim (BACTRIM DS,SEPTRA DS) 800-160 MG tablet Take 1 tablet by mouth 2 (two) times daily. 14 tablet 0  . tiZANidine (ZANAFLEX) 4 MG tablet Take 4 mg by mouth at bedtime  as needed for muscle spasms.     Marland Kitchen triamterene-hydrochlorothiazide (DYAZIDE) 37.5-25 MG per capsule Take 1 capsule by mouth daily.  1  . Vilazodone HCl (VIIBRYD) 40 MG TABS Take 1 tablet (40 mg total) by mouth daily. 30 tablet 11   No current facility-administered medications on file prior to visit.   Allergies  Allergen Reactions  . Invokana [Canagliflozin] Other (See Comments)    Yeast infectios  . Losartan     Angioedema   . Metformin And Related Diarrhea  . Latex Other (See Comments)    Other Reaction: redness, blisters  . Morphine And Related Other (See Comments)    Pt. States med makes her crazy  . Other Other (See Comments)     Other Reaction: redness, blisters  . Oxycodone-Acetaminophen Itching    TYLOX. Caused internal itching per patient

## 2015-01-07 NOTE — ED Notes (Signed)
Per EMS: Pt reports cp starting after lunch today in central chest with no radiation.  Pt describes pain as "sharp."  Pt was seen Friday for cp also and was diagnosed with "atypical chest pain." Pt alert and oriented.

## 2015-01-07 NOTE — Discharge Instructions (Signed)

## 2015-01-10 ENCOUNTER — Ambulatory Visit (INDEPENDENT_AMBULATORY_CARE_PROVIDER_SITE_OTHER): Payer: Commercial Managed Care - HMO | Admitting: Internal Medicine

## 2015-01-10 ENCOUNTER — Encounter: Payer: Self-pay | Admitting: Internal Medicine

## 2015-01-10 ENCOUNTER — Other Ambulatory Visit: Payer: Self-pay | Admitting: Internal Medicine

## 2015-01-10 VITALS — BP 110/68 | HR 102 | Temp 98.3°F | Ht 64.0 in | Wt 176.8 lb

## 2015-01-10 DIAGNOSIS — J45909 Unspecified asthma, uncomplicated: Secondary | ICD-10-CM

## 2015-01-10 DIAGNOSIS — E118 Type 2 diabetes mellitus with unspecified complications: Secondary | ICD-10-CM | POA: Diagnosis not present

## 2015-01-10 DIAGNOSIS — K219 Gastro-esophageal reflux disease without esophagitis: Secondary | ICD-10-CM | POA: Diagnosis not present

## 2015-01-10 DIAGNOSIS — R0789 Other chest pain: Secondary | ICD-10-CM | POA: Diagnosis not present

## 2015-01-10 DIAGNOSIS — Z794 Long term (current) use of insulin: Secondary | ICD-10-CM

## 2015-01-10 NOTE — Assessment & Plan Note (Signed)
Sugars have been high

## 2015-01-10 NOTE — Progress Notes (Signed)
Subjective:    Patient ID: [name], female    DOB: [birth year], 64 y.o.   MRN: [id number]  HPI She is here for a hospital follow up today.  She has a history of asthma.    She went to the ED 8/22 for intermittent moderate wheezing, sob, productive cough and chest pain.  She was ultimately diagnosed with an asthma exacerbation.  Her symptoms improved with nebulizer treatment.  She was placed on steroids.  She follows with pulmonary and sees them often.  They feel her GERD is a significant contributor to her intermittent wheeze and cough.  She takes zantac twice daily.  She was referred to GI late august.  They also feel her anxiety and depression and contributing - somatization.  She last saw pulmonary 9/29 and was experiencing wheezing.  She was given prednisone and antibiotic (to hold in case she needed it). She took only one dose of the prednisone and stopping it because she thought it caused the chest pain.    She went to the ED 9/30 for chest pain.  She was ruled out for ACS with EKG and blood work.  Her chest xray was normal.  Her symptoms improved in the ed and was d/c'd home.  She returned to the ED 10/3 for chest pain. An ekg showed no changes and her troponin was normal.   Today she feels good.  She denies chest pain, sob, wheeze, cough and fevers.  She is taking the cough syrup prescribed by pulmonary and it helps.  Her GERD is controlled as long as she eats what she should be eating.  She takes gaviscon as needed.   Her sugars have been elevated recently in the 300's.  She has been getting high sugar drinks where she lives.  She is not compliant with a diabetic diet.  She is taking her insulin as prescribed.     Medications and allergies reviewed with patient and updated if appropriate.  Past Medical History  Diagnosis Date  . 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 [name], residual palpitations  . Vocal cord dysfunction    . Tremor, essential     takes Mysoline and Neurontin daily.  . Allergic rhinitis     uses Nasonex daily as needed  . Low sodium syndrome   . Chronic back pain     reason unknown  . Lumbar radiculopathy   . DDD (degenerative disc disease), lumbar   . Esophageal reflux     takes Zantac daily  . Hypertension     takes Losartan daily  . Anxiety     takes Ativan daily as needed  . Hypothyroidism     takes Synthroid daily  . Depression     takes Zoloft daily as well as Cymbalta  . Diabetes mellitus     takes NOvolin daily  . Asthma     Albuterol inhaler and Neb daily as needed  . History of bronchitis Dec 2014  . Seizures ([place]) 04-05-13    one time ran out of Primidone and had stopped it cold [place]  . Weakness     in left arm  . Joint pain   . Joint swelling     left ankle  . Dysphagia   . Constipation   . Allergy to angiotensin receptor blockers (ARB) 01/22/2014    Angioedema  . 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 Rayann Heman 2007  Past Surgical History  Procedure Laterality Date  . Tonsillectomy    . Right breast cyst      benign  . Left ankle ligament repair      x 2  . Left elbow repair    . Partial hysterectomy    . Cholecystectomy    . Cesarean section    . Ablation for avnrt    . Colonoscopy  01/16/2004    NLG:XQJJHE rectum/colon  . Esophagogastroduodenoscopy (egd) with esophageal dilation  04/03/2002    RDE:[id number]'H ring, otherwise normal esophagus, status post dilation with 54 F/Normal stomach  . Bartholin gland cyst excision      x 2  . Esophagogastroduodenoscopy (egd) with esophageal dilation N/A 11/08/2012    Procedure: ESOPHAGOGASTRODUODENOSCOPY (EGD) WITH ESOPHAGEAL DILATION;  Surgeon: Daneil Dolin, MD;  [facility];  Service: Endoscopy;  Laterality: N/A;  11:30  . Subthalamic stimulator insertion Bilateral 12/04/2013    Procedure: Bilateral Deep brain stimulator placement;  Surgeon: [name], MD;  Location: [place] [facility]UR[facility] ORS;   Service: Neurosurgery;  Laterality: Bilateral;  Bilateral Deep brain stimulator placement  . Pulse generator implant Bilateral 12/15/2013    Procedure: BILATERAL PULSE GENERATOR IMPLANT;  Surgeon: [name], MD;  Location: [place] NEUR[facility] ORS;  Service: Neurosurgery;  Laterality: Bilateral;  BILATERAL  . Maximum access (mas)posterior lumbar interbody fusion (plif) 1 level N/A 04/24/2014    Procedure: Lumbar four-five Maximum access Surgery posterior lumbar interbody fusion;  Surgeon: [name], MD;  Location: [place] NEUR[facility] ORS;  Service: Neurosurgery;  Laterality: N/A;  Lumbar four-five Maximum access Surgery posterior lumbar interbody fusion    Social History   Social History  . Marital Status: Married    Spouse Name: N/A  . Number of Children: N/A  . Years of Education: N/A   Occupational History  . retired    Social History Main Topics  . Smoking status: Former Smoker -- 0.30 packs/day for 10 years    Types: Cigarettes    Quit date: 04/23/1993  . Smokeless tobacco: Never Used     Comment: quit smoking 25+yrs ago  . Alcohol Use: No     Comment: no alcohol in 49yrs  . Drug Use: No  . Sexual Activity: No   Other Topics Concern  . None   Social History Narrative   Married, 1 daughte, living.  Lives with husband in [place], [place].   1 child died brain tumor at age 52.   Two grandchildren.   Works at [employer], patient currently lost her job.   Retired 2011.   No tobacco.   Alcohol: none in 30 yrs.  Distant history of heavy use.   No drug use.    Review of Systems  Constitutional: Negative for fever and chills.  HENT: Positive for congestion. Negative for postnasal drip and sore throat.   Respiratory: Negative for cough, shortness of breath and wheezing.   Cardiovascular: Positive for leg swelling (left ankle swelling - chronic). Negative for palpitations.  Gastrointestinal: Negative for nausea, abdominal pain and constipation (related to vicodin - takes miralax prn).        GERD - intermittent when she eats something she shouldn't, takes gaviscon prn  Neurological: Positive for headaches (occasional witih high sugars). Negative for light-headedness.  Psychiatric/Behavioral: Positive for dysphoric mood (chronic related to loss of son years ago). Negative for suicidal ideas.       Objective:   Filed Vitals:   01/10/15 0921  BP: 110/68  Pulse: 102  Temp: 98.3  F (36.8 C)   Filed Weights   01/10/15 0921  Weight: 176 lb 12 oz (80.173 kg)   Body mass index is 30.32 kg/(m^2).   Physical Exam  Constitutional: She is oriented to person, place, and time. She appears well-developed and well-nourished. No distress.  HENT:  Head: Normocephalic and atraumatic.  Right Ear: External ear normal.  Left Ear: External ear normal.  Mouth/Throat: Oropharynx is clear and moist.  Slight white coating on tongue, but has been treated for thrush  Neck: Neck supple. No thyromegaly present.  Cardiovascular: Regular rhythm and normal heart sounds.   Slight tachycardia  Pulmonary/Chest: Effort normal and breath sounds normal. No respiratory distress. She has no wheezes. She has no rales.  Musculoskeletal: She exhibits edema (left ankle - chronic, trace edema right ankle).  Lymphadenopathy:    She has no cervical adenopathy.  Neurological: She is alert and oriented to person, place, and time.  Psychiatric: She has a normal mood and affect. Her behavior is normal.        Assessment & Plan:   She is here for follow up from the hospital/ ED visit.  She went there for chest pain.  Chest pain, unspecified She has been to the ED a few times for chest pain Work up - EKG and troponins have been negative and chest pain resolves in ED May be stress/anxiety related, related to asthma or GERD Currently asymptomatic and feeling good Unlikely cardiac in nature No further testing indicated at this time  Asthma Following closely with pulmonary She is taking her allergy  medication and cough syrup, which is helping Asymptomatic today Follow up with pulmonary as schedule  GERD Not compliant with GERD diet History of esophageal dilation Takes gaviscon as needed Has an appt with GI next week Stressed importance of GERD diet  Diabetes Sugars have been elevated She is not compliant with a diabetic diet. She will talk to the director of the food services at her assisted living facility to discuss her diet requirement with them.   I also encouraged her not to eat the food even if they bring it to her.  She will in the next 1-2 weeks if her sugars remain elevated

## 2015-01-10 NOTE — Patient Instructions (Signed)
  We have reviewed your prior records including labs and tests today.  Work on improving your diet - decrease the sugars and carbohydrates.  Talk to the director of food at your assisted living facility to make sure they are providing you with a low sugar/carbodhydrate diet.   If you sugars continue to remain high let Dr. Ronnald Ramp know.   Medications reviewed and updated.  No changes recommended at this time.

## 2015-01-12 ENCOUNTER — Emergency Department (HOSPITAL_COMMUNITY)
Admission: EM | Admit: 2015-01-12 | Discharge: 2015-01-12 | Disposition: A | Discharge status: Home / Self Care | Payer: Commercial Managed Care - HMO | Attending: Emergency Medicine | Admitting: Emergency Medicine

## 2015-01-12 ENCOUNTER — Encounter (HOSPITAL_COMMUNITY): Payer: Self-pay | Admitting: *Deleted

## 2015-01-12 ENCOUNTER — Telehealth: Payer: Self-pay | Admitting: Acute Care

## 2015-01-12 ENCOUNTER — Encounter (HOSPITAL_COMMUNITY): Payer: Self-pay | Admitting: Emergency Medicine

## 2015-01-12 ENCOUNTER — Emergency Department (HOSPITAL_COMMUNITY): Payer: Commercial Managed Care - HMO

## 2015-01-12 DIAGNOSIS — Z7982 Long term (current) use of aspirin: Secondary | ICD-10-CM | POA: Insufficient documentation

## 2015-01-12 DIAGNOSIS — E1165 Type 2 diabetes mellitus with hyperglycemia: Secondary | ICD-10-CM | POA: Insufficient documentation

## 2015-01-12 DIAGNOSIS — Z8739 Personal history of other diseases of the musculoskeletal system and connective tissue: Secondary | ICD-10-CM | POA: Diagnosis not present

## 2015-01-12 DIAGNOSIS — J45901 Unspecified asthma with (acute) exacerbation: Secondary | ICD-10-CM | POA: Insufficient documentation

## 2015-01-12 DIAGNOSIS — Z794 Long term (current) use of insulin: Secondary | ICD-10-CM | POA: Diagnosis not present

## 2015-01-12 DIAGNOSIS — R739 Hyperglycemia, unspecified: Secondary | ICD-10-CM | POA: Diagnosis present

## 2015-01-12 DIAGNOSIS — K5900 Constipation, unspecified: Secondary | ICD-10-CM | POA: Insufficient documentation

## 2015-01-12 DIAGNOSIS — G40909 Epilepsy, unspecified, not intractable, without status epilepticus: Secondary | ICD-10-CM | POA: Diagnosis not present

## 2015-01-12 DIAGNOSIS — E039 Hypothyroidism, unspecified: Secondary | ICD-10-CM | POA: Insufficient documentation

## 2015-01-12 DIAGNOSIS — J45909 Unspecified asthma, uncomplicated: Secondary | ICD-10-CM | POA: Insufficient documentation

## 2015-01-12 DIAGNOSIS — Z91040 Latex allergy status: Secondary | ICD-10-CM | POA: Insufficient documentation

## 2015-01-12 DIAGNOSIS — K219 Gastro-esophageal reflux disease without esophagitis: Secondary | ICD-10-CM | POA: Diagnosis not present

## 2015-01-12 DIAGNOSIS — F419 Anxiety disorder, unspecified: Secondary | ICD-10-CM | POA: Insufficient documentation

## 2015-01-12 DIAGNOSIS — R251 Tremor, unspecified: Secondary | ICD-10-CM | POA: Diagnosis not present

## 2015-01-12 DIAGNOSIS — F329 Major depressive disorder, single episode, unspecified: Secondary | ICD-10-CM | POA: Insufficient documentation

## 2015-01-12 DIAGNOSIS — R05 Cough: Secondary | ICD-10-CM | POA: Diagnosis present

## 2015-01-12 DIAGNOSIS — Z7951 Long term (current) use of inhaled steroids: Secondary | ICD-10-CM | POA: Insufficient documentation

## 2015-01-12 DIAGNOSIS — Z792 Long term (current) use of antibiotics: Secondary | ICD-10-CM | POA: Insufficient documentation

## 2015-01-12 DIAGNOSIS — E119 Type 2 diabetes mellitus without complications: Secondary | ICD-10-CM | POA: Insufficient documentation

## 2015-01-12 DIAGNOSIS — Z87891 Personal history of nicotine dependence: Secondary | ICD-10-CM | POA: Diagnosis not present

## 2015-01-12 DIAGNOSIS — J209 Acute bronchitis, unspecified: Secondary | ICD-10-CM | POA: Insufficient documentation

## 2015-01-12 DIAGNOSIS — J40 Bronchitis, not specified as acute or chronic: Secondary | ICD-10-CM

## 2015-01-12 DIAGNOSIS — Z79899 Other long term (current) drug therapy: Secondary | ICD-10-CM | POA: Insufficient documentation

## 2015-01-12 DIAGNOSIS — I10 Essential (primary) hypertension: Secondary | ICD-10-CM | POA: Diagnosis not present

## 2015-01-12 DIAGNOSIS — G8929 Other chronic pain: Secondary | ICD-10-CM | POA: Insufficient documentation

## 2015-01-12 LAB — I-STAT CHEM 8, ED
BUN: 10 mg/dL (ref 6–20)
Calcium, Ion: 1.19 mmol/L (ref 1.13–1.30)
Chloride: 96 mmol/L — ABNORMAL LOW (ref 101–111)
Creatinine, Ser: 0.7 mg/dL (ref 0.44–1.00)
Glucose, Bld: 289 mg/dL — ABNORMAL HIGH (ref 65–99)
HCT: 38 % (ref 36.0–46.0)
Hemoglobin: 12.9 g/dL (ref 12.0–15.0)
Potassium: 4.2 mmol/L (ref 3.5–5.1)
Sodium: 134 mmol/L — ABNORMAL LOW (ref 135–145)
TCO2: 26 mmol/L (ref 0–100)

## 2015-01-12 LAB — CBG MONITORING, ED: Glucose-Capillary: 454 mg/dL — ABNORMAL HIGH (ref 65–99)

## 2015-01-12 LAB — COMPREHENSIVE METABOLIC PANEL
ALT: 49 U/L (ref 14–54)
AST: 35 U/L (ref 15–41)
Albumin: 4.1 g/dL (ref 3.5–5.0)
Alkaline Phosphatase: 86 U/L (ref 38–126)
Anion gap: 9 (ref 5–15)
BUN: 17 mg/dL (ref 6–20)
CO2: 28 mmol/L (ref 22–32)
Calcium: 9 mg/dL (ref 8.9–10.3)
Chloride: 98 mmol/L — ABNORMAL LOW (ref 101–111)
Creatinine, Ser: 0.87 mg/dL (ref 0.44–1.00)
GFR calc Af Amer: >60 mL/min (ref >60)
GFR calc non Af Amer: >60 mL/min (ref >60)
Glucose, Bld: 454 mg/dL — ABNORMAL HIGH (ref 65–99)
Potassium: 4.8 mmol/L (ref 3.5–5.1)
Sodium: 135 mmol/L (ref 135–145)
Total Bilirubin: 0.3 mg/dL (ref 0.3–1.2)
Total Protein: 7.4 g/dL (ref 6.5–8.1)

## 2015-01-12 MED ORDER — PREDNISONE 20 MG PO TABS: ORAL_TABLET | ORAL | Status: DC

## 2015-01-12 MED ORDER — METHYLPREDNISOLONE SODIUM SUCC 125 MG IJ SOLR
125.0000 mg | Freq: Once | INTRAMUSCULAR | Status: AC
  Administered 2015-01-12: 125 mg via INTRAVENOUS
  Filled 2015-01-12: qty 2

## 2015-01-12 MED ORDER — IPRATROPIUM-ALBUTEROL 0.5-2.5 (3) MG/3ML IN SOLN
3.0000 mL | Freq: Once | RESPIRATORY_TRACT | Status: AC
  Administered 2015-01-12: 3 mL via RESPIRATORY_TRACT
  Filled 2015-01-12: qty 3

## 2015-01-12 MED ORDER — ALBUTEROL SULFATE (2.5 MG/3ML) 0.083% IN NEBU
2.5000 mg | INHALATION_SOLUTION | Freq: Once | RESPIRATORY_TRACT | Status: AC
  Administered 2015-01-12: 2.5 mg via RESPIRATORY_TRACT
  Filled 2015-01-12: qty 3

## 2015-01-12 MED ORDER — INSULIN ASPART 100 UNIT/ML ~~LOC~~ SOLN
5.0000 [IU] | Freq: Once | SUBCUTANEOUS | Status: AC
  Administered 2015-01-12: 5 [IU] via SUBCUTANEOUS
  Filled 2015-01-12: qty 1

## 2015-01-12 MED ORDER — LEVOFLOXACIN 500 MG PO TABS: 500.0000 mg | ORAL_TABLET | Freq: Every day | ORAL | Status: DC

## 2015-01-12 NOTE — ED Notes (Signed)
454 CBG

## 2015-01-12 NOTE — Discharge Instructions (Signed)
Hyperglycemia  Your blood sugar is elevated and this is likely related to the steroids you received earlier today and are taking for your bronchitis.  Keep a close eye on your blood sugar and follow up with your primary care physician for further adjustments to your insulin.  Eat a healthy diet and refrain from eating foods high in sugar/starch.    Hyperglycemia occurs when the glucose (sugar) in your blood is too high. Hyperglycemia can happen for many reasons, but it most often happens to people who do not know they have diabetes or are not managing their diabetes properly.  CAUSES  Whether you have diabetes or not, there are other causes of hyperglycemia. Hyperglycemia can occur when you have diabetes, but it can also occur in other situations that you might not be as aware of, such as: Diabetes  If you have diabetes and are having problems controlling your blood glucose, hyperglycemia could occur because of some of the following reasons:  Not following your meal plan.  Not taking your diabetes medications or not taking it properly.  Exercising less or doing less activity than you normally do.  Being sick. Pre-diabetes  This cannot be ignored. Before people develop Type 2 diabetes, they almost always have "pre-diabetes." This is when your blood glucose levels are higher than normal, but not yet high enough to be diagnosed as diabetes. Research has shown that some long-term damage to the body, especially the heart and circulatory system, may already be occurring during pre-diabetes. If you take action to manage your blood glucose when you have pre-diabetes, you may delay or prevent Type 2 diabetes from developing. Stress  If you have diabetes, you may be "diet" controlled or on oral medications or insulin to control your diabetes. However, you may find that your blood glucose is higher than usual in the hospital whether you have diabetes or not. This is often referred to as "stress  hyperglycemia." Stress can elevate your blood glucose. This happens because of hormones put out by the body during times of stress. If stress has been the cause of your high blood glucose, it can be followed regularly by your caregiver. That way he/she can make sure your hyperglycemia does not continue to get worse or progress to diabetes. Steroids  Steroids are medications that act on the infection fighting system (immune system) to block inflammation or infection. One side effect can be a rise in blood glucose. Most people can produce enough extra insulin to allow for this rise, but for those who cannot, steroids make blood glucose levels go even higher. It is not unusual for steroid treatments to "uncover" diabetes that is developing. It is not always possible to determine if the hyperglycemia will go away after the steroids are stopped. A special blood test called an A1c is sometimes done to determine if your blood glucose was elevated before the steroids were started. SYMPTOMS  Thirsty.  Frequent urination.  Dry mouth.  Blurred vision.  Tired or fatigue.  Weakness.  Sleepy.  Tingling in feet or leg. DIAGNOSIS  Diagnosis is made by monitoring blood glucose in one or all of the following ways:  A1c test. This is a chemical found in your blood.  Fingerstick blood glucose monitoring.  Laboratory results. TREATMENT  First, knowing the cause of the hyperglycemia is important before the hyperglycemia can be treated. Treatment may include, but is not be limited to:  Education.  Change or adjustment in medications.  Change or adjustment in meal plan.  Treatment for an illness, infection, etc.  More frequent blood glucose monitoring.  Change in exercise plan.  Decreasing or stopping steroids.  Lifestyle changes. HOME CARE INSTRUCTIONS   Test your blood glucose as directed.  Exercise regularly. Your caregiver will give you instructions about exercise. Pre-diabetes or  diabetes which comes on with stress is helped by exercising.  Eat wholesome, balanced meals. Eat often and at regular, fixed times. Your caregiver or nutritionist will give you a meal plan to guide your sugar intake.  Being at an ideal weight is important. If needed, losing as little as 10 to 15 pounds may help improve blood glucose levels. SEEK MEDICAL CARE IF:   You have questions about medicine, activity, or diet.  You continue to have symptoms (problems such as increased thirst, urination, or weight gain). SEEK IMMEDIATE MEDICAL CARE IF:   You are vomiting or have diarrhea.  Your breath smells fruity.  You are breathing faster or slower.  You are very sleepy or incoherent.  You have numbness, tingling, or pain in your feet or hands.  You have chest pain.  Your symptoms get worse even though you have been following your caregiver's orders.  If you have any other questions or concerns.   This information is not intended to replace advice given to you by your health care provider. Make sure you discuss any questions you have with your health care provider.   Document Released: 09/16/2000 Document Revised: 06/15/2011 Document Reviewed: 11/27/2014 Elsevier Interactive Patient Education [employer].

## 2015-01-12 NOTE — ED Notes (Addendum)
Pt from assisted living residence of [name] and brought in by EMS. Pt reports was diagnosed with bronchitis. Pt reports finished abx yesterday and used rescue nebulizer with no relief. Pt alert and oriented. nad noted. Airway patent.mild audible wheezing heard in triage.

## 2015-01-12 NOTE — Discharge Instructions (Signed)
Follow up with your md if not improving. °

## 2015-01-12 NOTE — ED Provider Notes (Signed)
CSN: [id number]     Arrival date & time 01/12/15  1122 History  By signing my name below, I, [name], attest that this documentation has been prepared under the direction and in the presence of Elizabeth Ferguson, MD. Electronically Signed: Hilda [name], ED Scribe. 01/12/2015. 11:52 AM.    Chief Complaint  Patient presents with  . Cough      Patient is a 64 y.o. female presenting with cough. The history is provided by the patient. No language interpreter was used.  Cough Cough characteristics:  Productive Sputum characteristics:  Clear, green and yellow Severity:  Mild Duration:  1 week Timing:  Constant Associated symptoms: no chest pain, no eye discharge, no headaches and no rash    HPI Comments:  [name] is a 63 y.o. female brought in by parents to the Emergency Department complaining of a worsening, intermittent productive cough that has been present for around a week. Pt states she was told she has bronchitis last week and states she finished her antibiotics yesterday. Pt states the antibiotic treatment did not improve her symptoms, and notes she has had a productive cough with yellow and green mucous for a while. Pt is a resident of an assisted living residence in [place].    Past Medical History  Diagnosis Date  . 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 [name], residual palpitations  . Vocal cord dysfunction   . Tremor, essential     takes Mysoline and Neurontin daily.  . Allergic rhinitis     uses Nasonex daily as needed  . Low sodium syndrome   . Chronic back pain     reason unknown  . Lumbar radiculopathy   . DDD (degenerative disc disease), lumbar   . Esophageal reflux     takes Zantac daily  . Hypertension     takes Losartan daily  . Anxiety     takes Ativan daily as needed  . Hypothyroidism     takes Synthroid daily  . Depression     takes Zoloft daily as well as Cymbalta  . Diabetes mellitus     takes  NOvolin daily  . Asthma     Albuterol inhaler and Neb daily as needed  . History of bronchitis Dec 2014  . Seizures ([place]) 04-05-13    one time ran out of Primidone and had stopped it cold [place]  . Weakness     in left arm  . Joint pain   . Joint swelling     left ankle  . Dysphagia   . Constipation   . Allergy to angiotensin receptor blockers (ARB) 01/22/2014    Angioedema  . 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 Rayann Heman 2007   Past Surgical History  Procedure Laterality Date  . Tonsillectomy    . Right breast cyst      benign  . Left ankle ligament repair      x 2  . Left elbow repair    . Partial hysterectomy    . Cholecystectomy    . Cesarean section    . Ablation for avnrt    . Colonoscopy  01/16/2004    YQM:GNOIBB rectum/colon  . Esophagogastroduodenoscopy (egd) with esophageal dilation  04/03/2002    CWU:[id number] ring, otherwise normal esophagus, status post dilation with 54 F/Normal stomach  . Bartholin gland cyst excision      x 2  . Esophagogastroduodenoscopy (egd) with esophageal dilation  N/A 11/08/2012    Procedure: ESOPHAGOGASTRODUODENOSCOPY (EGD) WITH ESOPHAGEAL DILATION;  Surgeon: Elizabeth Dolin, MD;  Location: [facility];  Service: Endoscopy;  Laterality: N/A;  11:30  . Subthalamic stimulator insertion Bilateral 12/04/2013    Procedure: Bilateral Deep brain stimulator placement;  Surgeon: Elizabeth Levine, MD;  Location: [place] [facility] ORS;  Service: Neurosurgery;  Laterality: Bilateral;  Bilateral Deep brain stimulator placement  . Pulse generator implant Bilateral 12/15/2013    Procedure: BILATERAL PULSE GENERATOR IMPLANT;  Surgeon: Elizabeth Levine, MD;  Location: [place] [facility]UR[facility] ORS;  Service: Neurosurgery;  Laterality: Bilateral;  BILATERAL  . Maximum access (mas)posterior lumbar interbody fusion (plif) 1 level N/A 04/24/2014    Procedure: Lumbar four-five Maximum access Surgery posterior lumbar interbody fusion;  Surgeon: Elizabeth Levine, MD;  Location: [facility] OR[facility];   Service: Neurosurgery;  Laterality: N/A;  Lumbar four-five Maximum access Surgery posterior lumbar interbody fusion   Family History  Problem Relation Age of Onset  . Lung cancer Father     DIED AGE 31 LUNG CA  . Alcohol abuse Father   . Anxiety disorder Father   . Depression Father   . COPD Mother     DIED AGE 58,MRSA,COPD,PNEUMONIA  . Pneumonia Mother     DIED AGE 58,MRSA,COPD,PNEUMONIA  . Supraventricular tachycardia Mother   . Anxiety disorder Mother   . Depression Mother   . Alcohol abuse Mother   . COPD Brother     AGE 62  . Heart disease Sister     DIED AGE 28, SMOKER,?HEART  . Colon cancer Neg Hx    Social History  Substance Use Topics  . Smoking status: Former Smoker -- 0.30 packs/day for 10 years    Types: Cigarettes    Quit date: 04/23/1993  . Smokeless tobacco: Never Used     Comment: quit smoking 25+yrs ago  . Alcohol Use: No     Comment: no alcohol in 1yrs   OB History    No data available     Review of Systems  Constitutional: Negative for appetite change and fatigue.  HENT: Negative for congestion, ear discharge and sinus pressure.   Eyes: Negative for discharge.  Respiratory: Positive for cough.   Cardiovascular: Negative for chest pain.  Gastrointestinal: Negative for abdominal pain and diarrhea.  Genitourinary: Negative for frequency and hematuria.  Musculoskeletal: Negative for back pain.  Skin: Negative for rash.  Neurological: Negative for seizures and headaches.  Psychiatric/Behavioral: Negative for hallucinations.      Allergies  Invokana; Losartan; Metformin and related; Latex; Morphine and related; Other; and Oxycodone-acetaminophen  Home Medications   Prior to Admission medications   Medication Sig Start Date End Date Taking? Authorizing Provider  albuterol (PROVENTIL HFA;VENTOLIN HFA) 108 (90 BASE) MCG/ACT inhaler Inhale 2 puffs into the lungs every 6 (six) hours as needed for wheezing or shortness of breath. 11/26/14   Ezequiel Essex, MD  albuterol (PROVENTIL) (2.5 MG/3ML) 0.083% nebulizer solution Take 3 mLs (2.5 mg total) by nebulization every 6 (six) hours as needed for wheezing or shortness of breath. 11/28/14   Deneise Lever, MD  aspirin EC 81 MG tablet Take 81 mg by mouth every morning.     Historical Provider, MD  fesoterodine (TOVIAZ) 8 MG TB24 tablet Take 1 tablet (8 mg total) by mouth daily. 12/31/14   Janith Lima, MD  fexofenadine (ALLEGRA) 180 MG tablet Take 1 tablet (180 mg total) by mouth daily. 12/06/14   Deneise Lever, MD  Fluticasone Furoate-Vilanterol (BREO ELLIPTA)  200-25 MCG/INH AEPB Inhale 1 puff into the lungs daily. 12/28/14   Deneise Lever, MD  gabapentin (NEURONTIN) 300 MG capsule Take 1 tablet in the morning, 2 at night Patient taking differently: Take 300-600 mg by mouth 2 (two) times daily. Take 1 tablet in the morning, 2 at night 08/29/14   Eustace Quail Tat, DO  glucose blood test strip Use TID  E11.9 04/10/14   Biagio Borg, MD  HYDROcodone-acetaminophen [facility]) 10-325 MG per tablet Take 1 tablet by mouth every 6 (six) hours as needed for moderate pain. 12/25/14   Janith Lima, MD  Insulin Glargine (TOUJEO SOLOSTAR) 300 UNIT/ML SOPN Inject 100 Units into the skin daily. 12/05/14   Janith Lima, MD  Insulin Lispro, Human, (HUMALOG KWIKPEN) 200 UNIT/ML SOPN Inject 10 Units into the skin 3 (three) times daily with meals. 12/05/14   Janith Lima, MD  Insulin Pen Needle (NOVOFINE) 30G X 8 MM MISC Use needle to inject insulin into the skin as directed. 12/25/14   Janith Lima, MD  KLOR-CON M20 20 MEQ tablet Take 1 tablet (20 mEq total) by mouth once. 11/21/14   Janith Lima, MD  levothyroxine (SYNTHROID, LEVOTHROID) 75 MCG tablet TAKE ONE TABLET BY MOUTH ONCE DAILY BEFORE BREAKFAST 08/29/14   Janith Lima, MD  metoprolol succinate (TOPROL-XL) 50 MG 24 hr tablet Take 1 tablet (50 mg total) by mouth daily. Take with or immediately following a meal. 11/06/14   Janith Lima, MD  Multiple  Vitamins-Minerals (MULTIVITAMIN PO) Take 1 tablet by mouth daily.    Historical Provider, MD  nystatin (MYCOSTATIN) 100000 UNIT/ML suspension Take 5 mLs (500,000 Units total) by mouth 4 (four) times daily. 12/06/14   Janith Lima, MD  nystatin cream (MYCOSTATIN) Apply 1 application topically 2 (two) times daily. 12/06/14   Janith Lima, MD  Omega-3 Fatty Acids (FISH OIL PO) Take 1 capsule by mouth daily.    Historical Provider, MD  ondansetron (ZOFRAN ODT) 8 MG disintegrating tablet Take 1 tablet (8 mg total) by mouth every 8 (eight) hours as needed for nausea or vomiting. 11/24/14   Evalee Jefferson, PA-C  primidone (MYSOLINE) 50 MG tablet TAKE THREE TABLETS BY MOUTH TWICE DAILY 07/30/14   Janith Lima, MD  promethazine-codeine (PHENERGAN WITH CODEINE) 6.25-10 MG/5ML syrup Take 5 mLs by mouth every 6 (six) hours as needed for cough. 01/07/15 01/17/15  Deneise Lever, MD  ranitidine (ZANTAC) 150 MG capsule Take 150 mg by mouth 2 (two) times daily.    Historical Provider, MD  sitaGLIPtin (JANUVIA) 100 MG tablet Take 1 tablet (100 mg total) by mouth daily. 12/05/14   Janith Lima, MD  sulfamethoxazole-trimethoprim (BACTRIM DS,SEPTRA DS) 800-160 MG tablet Take 1 tablet by mouth 2 (two) times daily. 01/03/15   Deneise Lever, MD  tiZANidine (ZANAFLEX) 4 MG tablet Take 4 mg by mouth at bedtime as needed for muscle spasms.     Historical Provider, MD  triamterene-hydrochlorothiazide (DYAZIDE) 37.5-25 MG per capsule Take 1 capsule by mouth daily. 08/30/14   Historical Provider, MD  Vilazodone HCl (VIIBRYD) 40 MG TABS Take 1 tablet (40 mg total) by mouth daily. 11/21/14   Janith Lima, MD   BP 133/79 mmHg  Pulse 86  Temp(Src) 98.2 F (36.8 C) (Oral)  Resp 18  Ht 5' 4.5" (1.638 m)  Wt 176 lb (79.833 kg)  BMI 29.75 kg/m2  SpO2 99% Physical Exam  Constitutional: She is oriented to person, place,  and time. She appears well-developed.  Tremors   HENT:  Head: Normocephalic.  Eyes: Conjunctivae and EOM are  normal. No scleral icterus.  Neck: Neck supple. No thyromegaly present.  Cardiovascular: Normal rate and regular rhythm.  Exam reveals no gallop and no friction rub.   No murmur heard. Pulmonary/Chest: No stridor. She has wheezes. She has no rales. She exhibits no tenderness.  Minimal wheezing bilaterally  Abdominal: She exhibits no distension. There is no tenderness. There is no rebound.  Musculoskeletal: Normal range of motion. She exhibits no edema.  Lymphadenopathy:    She has no cervical adenopathy.  Neurological: She is oriented to person, place, and time. She exhibits normal muscle tone. Coordination normal.  Skin: No rash noted. No erythema.  Psychiatric: She has a normal mood and affect. Her behavior is normal.    ED Course  Procedures (including critical care time)  DIAGNOSTIC STUDIES: Oxygen Saturation is 99% on room air, normal by my interpretation.    COORDINATION OF CARE: 11:48 AM Discussed treatment plan with pt at bedside and pt agreed to plan.   Labs Review Labs Reviewed - No data to display  Imaging Review No results found. I have personally reviewed and evaluated these images and lab results as part of my medical decision-making.   EKG Interpretation None      MDM   Final diagnoses:  None   Labs unremarkable including chest x-ray except for glucose elevated 200. Patient improving with albuterol and Atrovent neb treatment. Diagnosis bronchitis with bronchospasm patient will be sent home on Levaquin and prednisone    Elizabeth Ferguson, MD 01/12/15 1441

## 2015-01-12 NOTE — ED Notes (Addendum)
Pt here stating her blood sugar is 535 at around 5:40pm and she took 10 units of humalog. Pt was seen and treated here earlier today for bronchitis. CBG in triage is: 9

## 2015-01-12 NOTE — ED Notes (Signed)
Pt states understanding of care given and follow up instructions 

## 2015-01-12 NOTE — ED Provider Notes (Signed)
CSN: [id number]     Arrival date & time 01/12/15  1944 History   First MD Initiated Contact with Patient 01/12/15 2007     Chief Complaint  Patient presents with  . Hyperglycemia     (Consider location/radiation/quality/duration/timing/severity/associated sxs/prior Treatment) HPI Comments: 64 y.o. Female with history of insulin dependent diabetes, chronic back pain, asthma presents for high blood sugar.  The patient reports that tonight before supper she took her blood glucose and found it to be 535.  The patient was seen earlier today for bronchitis and treated with steroids and breathing treatments and discharged home in stable condition. Patients blood glucose at that time was elevated at 289.  After discharge the patient left and ate a high starch meal.  Before dinner tonight she checked her blood    Past Medical History  Diagnosis Date  . 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 [name], residual palpitations  . Vocal cord dysfunction   . Tremor, essential     takes Mysoline and Neurontin daily.  . Allergic rhinitis     uses Nasonex daily as needed  . Low sodium syndrome   . Chronic back pain     reason unknown  . Lumbar radiculopathy   . DDD (degenerative disc disease), lumbar   . Esophageal reflux     takes Zantac daily  . Hypertension     takes Losartan daily  . Anxiety     takes Ativan daily as needed  . Hypothyroidism     takes Synthroid daily  . Depression     takes Zoloft daily as well as Cymbalta  . Diabetes mellitus     takes NOvolin daily  . Asthma     Albuterol inhaler and Neb daily as needed  . History of bronchitis Dec 2014  . Seizures ([place]) 04-05-13    one time ran out of Primidone and had stopped it cold [place]  . Weakness     in left arm  . Joint pain   . Joint swelling     left ankle  . Dysphagia   . Constipation   . Allergy to angiotensin receptor blockers (ARB) 01/22/2014    Angioedema  .  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 Rayann Heman 2007   Past Surgical History  Procedure Laterality Date  . Tonsillectomy    . Right breast cyst      benign  . Left ankle ligament repair      x 2  . Left elbow repair    . Partial hysterectomy    . Cholecystectomy    . Cesarean section    . Ablation for avnrt    . Colonoscopy  01/16/2004    BOF:BPZWCH rectum/colon  . Esophagogastroduodenoscopy (egd) with esophageal dilation  04/03/2002    ENI:DPOEUMPN'T ring, otherwise normal esophagus, status post dilation with 56 F/Normal stomach  . Bartholin gland cyst excision      x 2  . Esophagogastroduodenoscopy (egd) with esophageal dilation N/A 11/08/2012    Procedure: ESOPHAGOGASTRODUODENOSCOPY (EGD) WITH ESOPHAGEAL DILATION;  Surgeon: Daneil Dolin, MD;  [facility];  Service: Endoscopy;  Laterality: N/A;  11:30  . Subthalamic stimulator insertion Bilateral 12/04/2013    Procedure: Bilateral Deep brain stimulator placement;  Surgeon: [name], MD;  Location: [place];  Service: Neurosurgery;  Laterality: Bilateral;  Bilateral Deep brain stimulator placement  . Pulse generator implant Bilateral 12/15/2013  Procedure: BILATERAL PULSE GENERATOR IMPLANT;  Surgeon: [name], MD;  Location: [facility];  Service: Neurosurgery;  Laterality: Bilateral;  BILATERAL  . Maximum access (mas)posterior lumbar interbody fusion (plif) 1 level N/A 04/24/2014    Procedure: Lumbar four-five Maximum access Surgery posterior lumbar interbody fusion;  Surgeon: [name], MD;  Location: [facility];  Service: Neurosurgery;  Laterality: N/A;  Lumbar four-five Maximum access Surgery posterior lumbar interbody fusion   Family History  Problem Relation Age of Onset  . Lung cancer Father     DIED AGE 57 LUNG CA  . Alcohol abuse Father   . Anxiety disorder Father   . Depression Father   . COPD Mother     DIED AGE 45,MRSA,COPD,PNEUMONIA  . Pneumonia Mother     DIED AGE  66,MRSA,COPD,PNEUMONIA  . Supraventricular tachycardia Mother   . Anxiety disorder Mother   . Depression Mother   . Alcohol abuse Mother   . COPD Brother     AGE 40  . Heart disease Sister     DIED AGE 65, SMOKER,?HEART  . Colon cancer Neg Hx    Social History  Substance Use Topics  . Smoking status: Former Smoker -- 0.30 packs/day for 10 years    Types: Cigarettes    Quit date: 04/23/1993  . Smokeless tobacco: Never Used     Comment: quit smoking 25+yrs ago  . Alcohol Use: No     Comment: no alcohol in 20yrs   OB History    No data available     Review of Systems  Constitutional: Negative for fever, chills and fatigue.  HENT: Negative for congestion, postnasal drip and rhinorrhea.   Respiratory: Negative for chest tightness.   Cardiovascular: Negative for chest pain and palpitations.  Gastrointestinal: Negative for nausea, abdominal pain and diarrhea.  Genitourinary: Negative for dysuria, urgency and frequency.  Musculoskeletal: Negative for myalgias and back pain.  Skin: Negative for rash.  Neurological: Negative for dizziness, weakness and numbness.  Hematological: Does not bruise/bleed easily.      Allergies  Invokana; Losartan; Metformin and related; Latex; Morphine and related; Other; and Oxycodone-acetaminophen  Home Medications   Prior to Admission medications   Medication Sig Start Date End Date Taking? Authorizing Provider  albuterol (PROVENTIL HFA;VENTOLIN HFA) 108 (90 BASE) MCG/ACT inhaler Inhale 2 puffs into the lungs every 6 (six) hours as needed for wheezing or shortness of breath. 11/26/14   Ezequiel Essex, MD  albuterol (PROVENTIL) (2.5 MG/3ML) 0.083% nebulizer solution Take 3 mLs (2.5 mg total) by nebulization every 6 (six) hours as needed for wheezing or shortness of breath. 11/28/14   Deneise Lever, MD  ASPIRIN LOW DOSE 81 MG EC tablet TAKE 1 TABLET BY MOUTH ONCE DAILY. 01/12/15   Janith Lima, MD  fesoterodine (TOVIAZ) 8 MG TB24 tablet Take 1  tablet (8 mg total) by mouth daily. 12/31/14   Janith Lima, MD  fexofenadine (ALLEGRA) 60 MG tablet Take 60 mg by mouth 2 (two) times daily.    Historical Provider, MD  Fluticasone Furoate-Vilanterol (BREO ELLIPTA) 200-25 MCG/INH AEPB Inhale 1 puff into the lungs daily. 12/28/14   Deneise Lever, MD  gabapentin (NEURONTIN) 300 MG capsule TAKE 1 CAPSULE BY MOUTH EVERY MORNING AND 2 CAPSULES AT BEDTIME. 01/12/15   Janith Lima, MD  glucose blood test strip Use TID  E11.9 04/10/14   Biagio Borg, MD  HYDROcodone-acetaminophen [facility]) 10-325 MG per tablet Take 1 tablet by mouth every 6 (six) hours  as needed for moderate pain. 12/25/14   Janith Lima, MD  Insulin Glargine (TOUJEO SOLOSTAR) 300 UNIT/ML SOPN Inject 100 Units into the skin daily. 12/05/14   Janith Lima, MD  Insulin Lispro, Human, (HUMALOG KWIKPEN) 200 UNIT/ML SOPN Inject 10 Units into the skin 3 (three) times daily with meals. 12/05/14   Janith Lima, MD  Insulin Pen Needle (NOVOFINE) 30G X 8 MM MISC Use needle to inject insulin into the skin as directed. 12/25/14   Janith Lima, MD  JANUVIA 100 MG tablet TAKE 1 TABLET BY MOUTH ONCE DAILY. 01/12/15   Janith Lima, MD  levofloxacin (LEVAQUIN) 500 MG tablet Take 1 tablet (500 mg total) by mouth daily. 01/12/15   [name], MD  levothyroxine (SYNTHROID, LEVOTHROID) 75 MCG tablet TAKE (1) TABLET BY MOUTH ONCE DAILY BEFORE BREAKFAST. 01/12/15   Janith Lima, MD  metoprolol succinate (TOPROL-XL) 50 MG 24 hr tablet TAKE 1 TABLET BY MOUTH ONCE DAILY. 01/12/15   Janith Lima, MD  Multiple Vitamins-Minerals (MULTIVITAMIN PO) Take 1 tablet by mouth daily.    Historical Provider, MD  nystatin (MYCOSTATIN) 100000 UNIT/ML suspension Take 5 mLs (500,000 Units total) by mouth 4 (four) times daily. Patient not taking: Reported on 01/12/2015 12/06/14   Janith Lima, MD  nystatin cream (MYCOSTATIN) Apply 1 application topically 2 (two) times daily. Patient not taking: Reported on 01/12/2015 12/06/14    Janith Lima, MD  Omega-3 Fatty Acids (FISH OIL PO) Take 1 capsule by mouth daily.    Historical Provider, MD  ondansetron (ZOFRAN ODT) 8 MG disintegrating tablet Take 1 tablet (8 mg total) by mouth every 8 (eight) hours as needed for nausea or vomiting. 11/24/14   Evalee Jefferson, PA-C  polyethylene glycol (MIRALAX / GLYCOLAX) packet Take 17 g by mouth daily as needed for mild constipation.    Historical Provider, MD  potassium chloride SA (K-DUR,KLOR-CON) 20 MEQ tablet TAKE 1 TABLET BY MOUTH ONCE DAILY. 01/12/15   Janith Lima, MD  predniSONE (DELTASONE) 20 MG tablet 2 tabs po daily x 3 days 01/12/15   [name], MD  primidone (MYSOLINE) 50 MG tablet TAKE 3 TABLETS (150mg ) BY MOUTH TWICE DAILY. 01/12/15   Janith Lima, MD  promethazine-codeine (PHENERGAN WITH CODEINE) 6.25-10 MG/5ML syrup Take 5 mLs by mouth every 6 (six) hours as needed for cough. 01/07/15 01/17/15  Deneise Lever, MD  ranitidine (ZANTAC) 150 MG capsule Take 150 mg by mouth 2 (two) times daily.    Historical Provider, MD  ranitidine (ZANTAC) 150 MG tablet TAKE 1 TABLET BY MOUTH TWICE DAILY. 01/12/15   Janith Lima, MD  sulfamethoxazole-trimethoprim (BACTRIM DS,SEPTRA DS) 800-160 MG tablet Take 1 tablet by mouth 2 (two) times daily. 01/03/15   Deneise Lever, MD  tiZANidine (ZANAFLEX) 4 MG tablet Take 4 mg by mouth at bedtime as needed for muscle spasms.     Historical Provider, MD  triamterene-hydrochlorothiazide (MAXZIDE-25) 37.5-25 MG tablet TAKE 1 TABLET BY MOUTH ONCE DAILY. 01/12/15   Janith Lima, MD  VIIBRYD 40 MG TABS TAKE 1 TABLET BY MOUTH ONCE DAILY. 01/12/15   Janith Lima, MD   BP 136/77 mmHg  Pulse 99  Temp(Src) 98.4 F (36.9 C) (Oral)  Resp 20  Ht 5' 4.5" (1.638 m)  Wt 176 lb (79.833 kg)  BMI 29.75 kg/m2  SpO2 99% Physical Exam  Constitutional: She is oriented to person, place, and time. She appears well-developed and well-nourished. No distress.  HENT:  Head: Normocephalic and atraumatic.  Right  Ear: External ear normal.  Left Ear: External ear normal.  Nose: Nose normal.  Mouth/Throat: Oropharynx is clear and moist. No oropharyngeal exudate.  Eyes: EOM are normal. Pupils are equal, round, and reactive to light.  Neck: Normal range of motion. Neck supple.  Cardiovascular: Normal rate, regular rhythm and intact distal pulses.   Pulmonary/Chest: Effort normal. No respiratory distress.  Abdominal: Soft. She exhibits no distension. There is no tenderness.  Musculoskeletal: Normal range of motion. She exhibits no edema or tenderness.  Neurological: She is alert and oriented to person, place, and time.  Skin: Skin is warm and dry. No rash noted. She is not diaphoretic.  Vitals reviewed.   ED Course  Procedures (including critical care time) Labs Review Labs Reviewed  CBG MONITORING, ED - Abnormal; Notable for the following:    Glucose-Capillary 454 (*)    All other components within normal limits    Imaging Review Dg Chest 2 View  01/12/2015   CLINICAL DATA:  PATIENT from assisted living residence [facility] and brought in by EMS. Pt reports was diagnosed with bronchitis. Pt reports finished abx yesterday and used rescue nebulizer with no relief. HISTORY OF DIABETES, HTN, ASTHMA, BRONCHITIS  EXAM: CHEST - 2 VIEW  COMPARISON:  01/04/2015  FINDINGS: Bilateral implanted generators with leads extending cephalad. Lungs clear. Heart size normal. No pneumothorax. No effusion. Minimal spurring in the mid thoracic spine.  IMPRESSION: No acute cardiopulmonary disease.   Electronically Signed   By: Lucrezia Europe M.D.   On: 01/12/2015 12:10   I have personally reviewed and evaluated these images and lab results as part of my medical decision-making.   EKG Interpretation None      MDM  Patient seen and evaluated in stable condition.  Hyperglycemia without sign of acidemia.  Patient given extra 5 units of insulin.  Hyperglycemia at least partially iatrogenic. Patient instructed to follow up with  PCP on Monday and to keep close eye on glucose.  All questions answered prior to discharge and patient discharged in stable condition. Final diagnoses:  None    1. Hyperglycemia    Harvel Quale, MD 01/12/15 2215

## 2015-01-14 ENCOUNTER — Telehealth: Payer: Self-pay

## 2015-01-14 ENCOUNTER — Ambulatory Visit: Payer: Commercial Managed Care - HMO | Admitting: Gastroenterology

## 2015-01-14 NOTE — Telephone Encounter (Signed)
Pharmacy called requesting  Verbal ok to fill omega 3. Verbal given pt cannot get OTC in nursing home for some reason. Script will be faxed for further rfs.

## 2015-01-15 ENCOUNTER — Telehealth: Payer: Self-pay | Admitting: Internal Medicine

## 2015-01-15 MED ORDER — PREDNISONE 10 MG PO TABS: ORAL_TABLET | ORAL | Status: DC

## 2015-01-15 NOTE — Telephone Encounter (Signed)
Try Please take prednisone 40 mg x1 day, then 30 mg x1 day, then 20 mg x1 day, then 10 mg x1 day, and then 5 mg x1 day and stop

## 2015-01-15 NOTE — Telephone Encounter (Signed)
Patient states she just got out of ER, she has bad case of Bronchitis. Patient was sent home from hospital with 3 day course of 40 mg of prednisone which she finishes tomorrow.  She is taking Levaquin now, but she is still wheezing.  She has been using her inhalers.  Chest tightness.  Coughing up yellow mucus.  No fever.  Patient wants to know what more she can take to help with her bronchitis so she doesn't have to go back into the ER.  Rx care - [place]

## 2015-01-15 NOTE — Telephone Encounter (Signed)
Rx sent to pharmacy. Patient notified. Nothing further needed.  

## 2015-01-18 ENCOUNTER — Ambulatory Visit (INDEPENDENT_AMBULATORY_CARE_PROVIDER_SITE_OTHER): Payer: Commercial Managed Care - HMO | Admitting: Internal Medicine

## 2015-01-18 ENCOUNTER — Telehealth: Payer: Self-pay | Admitting: Emergency Medicine

## 2015-01-18 ENCOUNTER — Encounter: Payer: Self-pay | Admitting: Internal Medicine

## 2015-01-18 ENCOUNTER — Telehealth: Payer: Self-pay | Admitting: Internal Medicine

## 2015-01-18 VITALS — BP 128/78 | HR 99 | Temp 98.3°F | Ht 64.5 in | Wt 176.0 lb

## 2015-01-18 VITALS — BP 136/84 | HR 98 | Temp 98.3°F | Resp 18 | Wt 177.0 lb

## 2015-01-18 DIAGNOSIS — E119 Type 2 diabetes mellitus without complications: Secondary | ICD-10-CM | POA: Diagnosis not present

## 2015-01-18 DIAGNOSIS — K219 Gastro-esophageal reflux disease without esophagitis: Secondary | ICD-10-CM | POA: Diagnosis not present

## 2015-01-18 DIAGNOSIS — M25572 Pain in left ankle and joints of left foot: Secondary | ICD-10-CM | POA: Diagnosis not present

## 2015-01-18 DIAGNOSIS — R05 Cough: Secondary | ICD-10-CM

## 2015-01-18 DIAGNOSIS — Z794 Long term (current) use of insulin: Secondary | ICD-10-CM

## 2015-01-18 DIAGNOSIS — J440 Chronic obstructive pulmonary disease with acute lower respiratory infection: Secondary | ICD-10-CM | POA: Diagnosis not present

## 2015-01-18 DIAGNOSIS — R058 Other specified cough: Secondary | ICD-10-CM | POA: Insufficient documentation

## 2015-01-18 MED ORDER — OMEPRAZOLE 40 MG PO CPDR: 40.0000 mg | DELAYED_RELEASE_CAPSULE | Freq: Every day | ORAL | Status: DC

## 2015-01-18 MED ORDER — FLUTTER DEVI: Status: DC

## 2015-01-18 MED ORDER — GLUCOSE BLOOD VI STRP: ORAL_STRIP | Status: DC

## 2015-01-18 NOTE — Progress Notes (Signed)
Pre visit review using our clinic review tool, if applicable. No additional management support is needed unless otherwise documented below in the visit note. 

## 2015-01-18 NOTE — Patient Instructions (Addendum)
Omeprazole 40 mg Take 30-60 min before first meal of the day and Zantac 300 mg at bedtime  GERD (REFLUX)  is an extremely common cause of respiratory symptoms just like yours , many times with no obvious heartburn at all.    It can be treated with medication, but also with lifestyle changes including elevation of the head of your bed (ideally with 6 inch  bed blocks),  Smoking cessation, avoidance of late meals, excessive alcohol, and avoid fatty foods, chocolate, peppermint, colas, red wine, and acidic juices such as orange juice.  NO MINT OR MENTHOL PRODUCTS SO NO COUGH DROPS  USE SUGARLESS CANDY INSTEAD (Jolley ranchers or Stover's or Life Savers) or even ice chips will also do - the key is to swallow to prevent all throat clearing. NO OIL BASED VITAMINS - use powdered substitutes.   Stop fish oil and Breo   For cough > use narco  1-2 every 4 hours as needed and use the flutter valve as much as you can   For breathing > use your nebulizer up to every 4 hours as needed   Keep all follow up appts with Dr Annamaria Boots

## 2015-01-18 NOTE — Progress Notes (Signed)
Subjective:    Patient ID: [name], female    DOB: 22-May-1950, 64 y.o.   MRN: [id number]  HPI She is here for hospital follow up.  I saw her on 10/6 for hospital follow up and she was feeling good.  On 10/8 she went to the ED for a productive cough with clear, yellow and green phlegm.  In the ED her glucose was elevated, but other labs were unremarkable.  Her cxr was normal.  She was given levaquin for 7 days and prednisone 40 mg daily for three days.  She was discharged, but returned to the ED that night for hyperglycemia, glucose 535 at home.  She had gone bojangles after leaving the hospital the first time.  She received 5 units of insulin in the ED and was discharged home.  She did call Pulmonary 10/11 and received a prednisone taper to help prevent recurrence.  She is still coughing and wheezing.  She coughed up a wet plug last night and was better after that.  She is using her nebulizer at home and it helps.  She denies sob and chest pain.  She denies fevers.    GERD:  She is having heartburn on a daily basis. She takes zantac.  Her heartburn typically comes after she eats.  She sees GI later this month.  Diabetes:  Her sugar was 107 this morning, yesterday afternoon it was over 300 (had too pumpkin spice creamer - sugar free), it was 102 yesterday morning, it was 328 prior to supper the day prior.  She is not compliant with a diabetic diet.   Hands are cracking:  She has cracks in her fingers.  She tries to use lotion frequently.  The cracks hurt occasionally.  She has chronic left foot and ankle pain and would like to see ortho.  Medications and allergies reviewed with patient and updated if appropriate.  Patient Active Problem List   Diagnosis Date Noted  . OAB (overactive bladder) 12/30/2014  . Incontinence in female 12/25/2014  . MDD (major depressive disorder), recurrent severe, without psychosis ([place]) 12/14/2014  . Thrush of mouth and esophagus ([place]) 11/23/2014  .  Depression with somatization 11/05/2014  . Scaphoid fracture of wrist 11/05/2014  . Type II diabetes mellitus with manifestations ([place]) 10/23/2014  . Visit for screening mammogram 08/21/2014  . Hypokalemia 05/30/2014  . Tinea corporis 05/30/2014  . Lumbar scoliosis 04/24/2014  . Sleep apnea 04/23/2014  . Allergy to angiotensin receptor blockers (ARB) 01/22/2014  . S/P deep brain stimulator placement 01/10/2014  . Essential tremor 12/04/2013  . Greater trochanteric bursitis of left hip 11/07/2013  . Spinal stenosis of lumbar region 10/30/2013  . Right thyroid nodule 10/12/2013  . CMC arthritis, thumb, degenerative 06/15/2013  . IBS (irritable bowel syndrome) 11/03/2012  . Esophageal stenosis 11/01/2012  . Moderate intermittent asthma without complication 27/25/3664  . Hypothyroidism 12/22/2011  . Hyperlipidemia with target LDL less than 100 06/22/2008  . Essential hypertension 09/04/2007  . TREMOR, ESSENTIAL 04/19/2007  . Seasonal and perennial allergic rhinitis 04/19/2007  . Esophageal reflux 04/19/2007    Past Medical History  Diagnosis Date  . 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 [name], residual palpitations  . Vocal cord dysfunction   . Tremor, essential     takes Mysoline and Neurontin daily.  . Allergic rhinitis     uses Nasonex daily as needed  . Low sodium syndrome   . Chronic  back pain     reason unknown  . Lumbar radiculopathy   . DDD (degenerative disc disease), lumbar   . Esophageal reflux     takes Zantac daily  . Hypertension     takes Losartan daily  . Anxiety     takes Ativan daily as needed  . Hypothyroidism     takes Synthroid daily  . Depression     takes Zoloft daily as well as Cymbalta  . Diabetes mellitus     takes NOvolin daily  . Asthma     Albuterol inhaler and Neb daily as needed  . History of bronchitis Dec 2014  . Seizures ([place]) 04-05-13    one time ran out of Primidone and had  stopped it cold [place]  . Weakness     in left arm  . Joint pain   . Joint swelling     left ankle  . Dysphagia   . Constipation   . Allergy to angiotensin receptor blockers (ARB) 01/22/2014    Angioedema  . 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 Rayann Heman 2007    Past Surgical History  Procedure Laterality Date  . Tonsillectomy    . Right breast cyst      benign  . Left ankle ligament repair      x 2  . Left elbow repair    . Partial hysterectomy    . Cholecystectomy    . Cesarean section    . Ablation for avnrt    . Colonoscopy  01/16/2004    VOH:YWVPXT rectum/colon  . Esophagogastroduodenoscopy (egd) with esophageal dilation  04/03/2002    GGY:IRSWNIOE'V ring, otherwise normal esophagus, status post dilation with 56 F/Normal stomach  . Bartholin gland cyst excision      x 2  . Esophagogastroduodenoscopy (egd) with esophageal dilation N/A 11/08/2012    Procedure: ESOPHAGOGASTRODUODENOSCOPY (EGD) WITH ESOPHAGEAL DILATION;  Surgeon: Daneil Dolin, MD;  [facility];  Service: Endoscopy;  Laterality: N/A;  11:30  . Subthalamic stimulator insertion Bilateral 12/04/2013    Procedure: Bilateral Deep brain stimulator placement;  Surgeon: [name], MD;  Location: [place] [facility];  Service: Neurosurgery;  Laterality: Bilateral;  Bilateral Deep brain stimulator placement  . Pulse generator implant Bilateral 12/15/2013    Procedure: BILATERAL PULSE GENERATOR IMPLANT;  Surgeon: [name], MD;  Location: [place] [facility];  Service: Neurosurgery;  Laterality: Bilateral;  BILATERAL  . Maximum access (mas)posterior lumbar interbody fusion (plif) 1 level N/A 04/24/2014    Procedure: Lumbar four-five Maximum access Surgery posterior lumbar interbody fusion;  Surgeon: [name], MD;  Location: [place] [facility];  Service: Neurosurgery;  Laterality: N/A;  Lumbar four-five Maximum access Surgery posterior lumbar interbody fusion    Social History   Social History  . Marital Status:  Married    Spouse Name: N/A  . Number of Children: N/A  . Years of Education: N/A   Occupational History  . retired    Social History Main Topics  . Smoking status: Former Smoker -- 0.30 packs/day for 10 years    Types: Cigarettes    Quit date: 04/23/1993  . Smokeless tobacco: Never Used     Comment: quit smoking 25+yrs ago  . Alcohol Use: No     Comment: no alcohol in 72yrs  . Drug Use: No  . Sexual Activity: No   Other Topics Concern  . None   Social History Narrative   Married, 1 daughte, living.  Lives with husband  in [place], [place].   1 child died brain tumor at age 78.   Two grandchildren.   Works at [employer], patient currently lost her job.   Retired 2011.   No tobacco.   Alcohol: none in 30 yrs.  Distant history of heavy use.   No drug use.    Review of Systems  Constitutional: Negative for fever and chills.  HENT: Positive for congestion, ear pain and rhinorrhea. Negative for sore throat.   Respiratory: Positive for cough and wheezing. Negative for shortness of breath.   Cardiovascular: Positive for leg swelling. Negative for chest pain and palpitations.  Gastrointestinal: Positive for constipation. Negative for abdominal pain.       GERD  Neurological: Positive for headaches. Negative for light-headedness.       Objective:   Filed Vitals:   01/18/15 0924  BP: 136/84  Pulse: 98  Temp: 98.3 F (36.8 C)  Resp: 18   Filed Weights   01/18/15 0924  Weight: 177 lb (80.287 kg)   Body mass index is 29.92 kg/(m^2).   Physical Exam  Constitutional: She appears well-developed and well-nourished. No distress.  HENT:  Head: Normocephalic and atraumatic.  Right Ear: External ear normal.  Left Ear: External ear normal.  Mouth/Throat: Oropharynx is clear and moist.  Eyes: Conjunctivae are normal.  Neck: No tracheal deviation present. No thyromegaly present.  Cardiovascular: Normal rate and regular rhythm.   Pulmonary/Chest: Effort normal. No  respiratory distress. She has wheezes (diffuse with expiration).  Abdominal: Soft. She exhibits no distension. There is no tenderness.  Musculoskeletal: She exhibits edema (1+ pitting b/l).  Lymphadenopathy:    She has no cervical adenopathy.  Skin: She is not diaphoretic.        Assessment & Plan:   Acute on chronic bronchitis She is actively wheezing and already said she plans on seeing Pulmonary today after she leaves here I will let them adjust her medications since they know her better  GERD Not controlled Start omeprazole 40 mg daily  Continue zantac  Diabetes She is not compliant with a diabetic diet - stressed the importance of a low sugar/carb diet No changes in insulin today since her fasting sugars are very good   Will refer to ortho for chronic foot / ankle pain  F/u with Dr. Ronnald Ramp as scheduled

## 2015-01-18 NOTE — Patient Instructions (Addendum)
I sent a new prescription for your heartburn called omeprazole to your pharmacy.  You should take this 30 minutes prior to a meal.  You can continue the zantac.  You are planning on seeing pulmonary today so we will let them instruct you on changes to your lung medications.  Continue to monitor you sugars closely.  You need to be more careful about what you eat - maintain a low sugar/carb diet.  No changes are needed for your insulin.

## 2015-01-18 NOTE — Assessment & Plan Note (Addendum)
Spirometry while symptomatic 12/13/14 >  No obst > try off BREO 01/18/2015   The most common causes of chronic cough in immunocompetent adults include the following: upper airway cough syndrome (UACS), previously referred to as postnasal drip syndrome (PNDS), which is caused by variety of rhinosinus conditions; (2) asthma; (3) GERD; (4) chronic bronchitis from cigarette smoking or other inhaled environmental irritants; (5) nonasthmatic eosinophilic bronchitis; and (6) bronchiectasis.   These conditions, singly or in combination, have accounted for up to 94% of the causes of chronic cough in prospective studies.   Other conditions have constituted no >6% of the causes in prospective studies These have included bronchogenic carcinoma, chronic interstitial pneumonia, sarcoidosis, left ventricular failure, ACEI-induced cough, and aspiration from a condition associated with pharyngeal dysfunction.    Chronic cough is often simultaneously caused by more than one condition. A single cause has been found from 38 to 82% of the time, multiple causes from 18 to 62%. Multiply caused cough has been the result of three diseases up to 42% of the time.       Based on hx and exam, this is most likely:  Classic Upper airway cough syndrome, so named because it's frequently impossible to sort out how much is  CR/sinusitis with freq throat clearing (which can be related to primary GERD)   vs  causing  secondary (" extra esophageal")  GERD from wide swings in gastric pressure that occur with throat clearing, often  promoting self use of mint and menthol lozenges that reduce the lower esophageal sphincter tone and exacerbate the problem further in a cyclical fashion.   These are the same pts (now being labeled as having "irritable larynx syndrome" by some cough centers) who not infrequently have a history of having failed to tolerate ace inhibitors,  dry powder inhalers or biphosphonates or report having atypical reflux  symptoms that don't respond to standard doses of PPI , and are easily confused as having aecopd or asthma flares by even experienced allergists/ pulmonologists.   The first step is to maximize acid suppression /gerd diet and eliminate cyclical coughing with tramadol/flutter valve  eliminate DPI  then regroup if the cough persists.  I had an extended discussion with the patient reviewing all relevant studies completed to date and  lasting 13m of 40 min ov   1) Explained: The standardized cough guidelines published in Chest by Lissa Morales in 2006 are still the best available and consist of a multiple step process (up to 12!) , not a single office visit,  and are intended  to address this problem logically,  with an alogrithm dependent on response to empiric treatment at  each progressive step  to determine a specific diagnosis with  minimal addtional testing needed. Therefore if adherence is an issue or can't be accurately verified,  it's very unlikely the standard evaluation and treatment will be successful here.    Furthermore, response to therapy (other than acute cough suppression, which should only be used short term with avoidance of narcotic containing cough syrups if possible), can be a gradual process for which the patient may perceive immediate benefit.  Unlike going to an eye doctor where the best perscription is almost always the first one and is immediately effective, this is almost never the case in the management of chronic cough syndromes. Therefore the patient needs to commit up front to consistently adhere to recommendations  for up to 6 weeks of therapy directed at the likely underlying problem(s) before the response  can be reasonably evaluated.     2) Each maintenance medication was reviewed in detail including most importantly the difference between maintenance and prns and under what circumstances the prns are to be triggered using an action plan format that is not reflected in  the computer generated alphabetically organized AVS.    Please see instructions for details which were reviewed in writing and the patient given a copy highlighting the part that I personally wrote and discussed at today's ov.   See instructions for specific recommendations which were reviewed directly with the patient who was given a copy with highlighter outlining the key components.
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
## 2015-01-18 NOTE — Assessment & Plan Note (Signed)
Uncontrolled, having symptoms daily Start omeprazole 40 mg daily Continue zantac Will see GI on the 28th

## 2015-01-18 NOTE — Telephone Encounter (Signed)
MW has agreed to see this pt. She did not have a scheduled appointment as primary care stated thier OV note. Pt is aware that we will see her as well. Nothing further was needed.

## 2015-01-18 NOTE — Telephone Encounter (Signed)
Rx has been faxed to [facility]. They pick up the prescriptions for residents. [phone]

## 2015-01-19 NOTE — Assessment & Plan Note (Signed)
She is to keep her follow-up appointment with GI

## 2015-01-19 NOTE — Assessment & Plan Note (Signed)
There is a mild to moderate persistent asthma component with significant anxiety and vocal cord dysfunction overlay as well as suspected reflux trigger. Plan-she has a follow-up appointment with GI about reflux and she continues to follow with neurology for her tremor and dysphonia. We will look for possibility that she might benefit from Nucala or Xolair by checking total IgE and eosinophil count

## 2015-01-19 NOTE — Assessment & Plan Note (Signed)
When she gets anxious, or upset about her marriage status or living arrangements, her respiratory complaints flare

## 2015-01-21 ENCOUNTER — Telehealth: Payer: Self-pay | Admitting: Internal Medicine

## 2015-01-21 ENCOUNTER — Encounter (HOSPITAL_COMMUNITY): Payer: Self-pay | Admitting: Emergency Medicine

## 2015-01-21 ENCOUNTER — Emergency Department (HOSPITAL_COMMUNITY)
Admission: EM | Admit: 2015-01-21 | Discharge: 2015-01-21 | Disposition: A | Discharge status: Home / Self Care | Payer: Commercial Managed Care - HMO | Attending: Emergency Medicine | Admitting: Emergency Medicine

## 2015-01-21 ENCOUNTER — Emergency Department (HOSPITAL_COMMUNITY): Payer: Commercial Managed Care - HMO

## 2015-01-21 ENCOUNTER — Telehealth: Payer: Self-pay | Admitting: *Deleted

## 2015-01-21 DIAGNOSIS — E039 Hypothyroidism, unspecified: Secondary | ICD-10-CM | POA: Insufficient documentation

## 2015-01-21 DIAGNOSIS — Z91040 Latex allergy status: Secondary | ICD-10-CM | POA: Diagnosis not present

## 2015-01-21 DIAGNOSIS — I10 Essential (primary) hypertension: Secondary | ICD-10-CM | POA: Diagnosis not present

## 2015-01-21 DIAGNOSIS — K219 Gastro-esophageal reflux disease without esophagitis: Secondary | ICD-10-CM | POA: Insufficient documentation

## 2015-01-21 DIAGNOSIS — F419 Anxiety disorder, unspecified: Secondary | ICD-10-CM | POA: Diagnosis not present

## 2015-01-21 DIAGNOSIS — G8929 Other chronic pain: Secondary | ICD-10-CM | POA: Insufficient documentation

## 2015-01-21 DIAGNOSIS — Z87891 Personal history of nicotine dependence: Secondary | ICD-10-CM | POA: Insufficient documentation

## 2015-01-21 DIAGNOSIS — F329 Major depressive disorder, single episode, unspecified: Secondary | ICD-10-CM | POA: Insufficient documentation

## 2015-01-21 DIAGNOSIS — R0602 Shortness of breath: Secondary | ICD-10-CM | POA: Diagnosis present

## 2015-01-21 DIAGNOSIS — J45901 Unspecified asthma with (acute) exacerbation: Secondary | ICD-10-CM | POA: Diagnosis not present

## 2015-01-21 DIAGNOSIS — Z7982 Long term (current) use of aspirin: Secondary | ICD-10-CM | POA: Diagnosis not present

## 2015-01-21 DIAGNOSIS — G40909 Epilepsy, unspecified, not intractable, without status epilepticus: Secondary | ICD-10-CM | POA: Diagnosis not present

## 2015-01-21 DIAGNOSIS — Z792 Long term (current) use of antibiotics: Secondary | ICD-10-CM | POA: Insufficient documentation

## 2015-01-21 DIAGNOSIS — Z794 Long term (current) use of insulin: Secondary | ICD-10-CM | POA: Insufficient documentation

## 2015-01-21 DIAGNOSIS — M48061 Spinal stenosis, lumbar region without neurogenic claudication: Secondary | ICD-10-CM

## 2015-01-21 DIAGNOSIS — E119 Type 2 diabetes mellitus without complications: Secondary | ICD-10-CM | POA: Insufficient documentation

## 2015-01-21 DIAGNOSIS — M419 Scoliosis, unspecified: Secondary | ICD-10-CM

## 2015-01-21 DIAGNOSIS — Z79899 Other long term (current) drug therapy: Secondary | ICD-10-CM | POA: Insufficient documentation

## 2015-01-21 DIAGNOSIS — K5900 Constipation, unspecified: Secondary | ICD-10-CM | POA: Diagnosis not present

## 2015-01-21 LAB — BASIC METABOLIC PANEL
Anion gap: 9 (ref 5–15)
BUN: 10 mg/dL (ref 6–20)
CO2: 29 mmol/L (ref 22–32)
Calcium: 9 mg/dL (ref 8.9–10.3)
Chloride: 96 mmol/L — ABNORMAL LOW (ref 101–111)
Creatinine, Ser: 0.67 mg/dL (ref 0.44–1.00)
GFR calc Af Amer: >60 mL/min (ref >60)
GFR calc non Af Amer: >60 mL/min (ref >60)
Glucose, Bld: 138 mg/dL — ABNORMAL HIGH (ref 65–99)
Potassium: 3.6 mmol/L (ref 3.5–5.1)
Sodium: 134 mmol/L — ABNORMAL LOW (ref 135–145)

## 2015-01-21 LAB — URINALYSIS, ROUTINE W REFLEX MICROSCOPIC
Bilirubin Urine: NEGATIVE
Glucose, UA: NEGATIVE mg/dL
Hgb urine dipstick: NEGATIVE
Ketones, ur: NEGATIVE mg/dL
Leukocytes, UA: NEGATIVE
Nitrite: NEGATIVE
Protein, ur: NEGATIVE mg/dL
Specific Gravity, Urine: 1.01 (ref 1.005–1.030)
Urobilinogen, UA: 0.2 mg/dL (ref 0.0–1.0)
pH: 6.5 (ref 5.0–8.0)

## 2015-01-21 LAB — CBC WITH DIFFERENTIAL/PLATELET
Basophils Absolute: 0 10*3/uL (ref 0.0–0.1)
Basophils Relative: 0 %
Eosinophils Absolute: 0.1 10*3/uL (ref 0.0–0.7)
Eosinophils Relative: 1 %
HCT: 38.3 % (ref 36.0–46.0)
Hemoglobin: 13 g/dL (ref 12.0–15.0)
Lymphocytes Relative: 25 %
Lymphs Abs: 2.2 10*3/uL (ref 0.7–4.0)
MCH: 31.3 pg (ref 26.0–34.0)
MCHC: 33.9 g/dL (ref 30.0–36.0)
MCV: 92.1 fL (ref 78.0–100.0)
Monocytes Absolute: 0.6 10*3/uL (ref 0.1–1.0)
Monocytes Relative: 7 %
Neutro Abs: 5.8 10*3/uL (ref 1.7–7.7)
Neutrophils Relative %: 67 %
Platelets: 170 10*3/uL (ref 150–400)
RBC: 4.16 MIL/uL (ref 3.87–5.11)
RDW: 13.4 % (ref 11.5–15.5)
WBC: 8.8 10*3/uL (ref 4.0–10.5)

## 2015-01-21 LAB — TROPONIN I: Troponin I: <0.03 ng/mL (ref <0.031)

## 2015-01-21 MED ORDER — GLUCOSE BLOOD VI STRP: 1.0000 | ORAL_STRIP | Freq: Two times a day (BID) | Status: DC

## 2015-01-21 MED ORDER — PREDNISONE 10 MG PO TABS: ORAL_TABLET | ORAL | Status: DC

## 2015-01-21 MED ORDER — ONETOUCH ULTRA 2 W/DEVICE KIT: PACK | Status: DC

## 2015-01-21 MED ORDER — PREDNISONE 50 MG PO TABS
25.0000 mg | ORAL_TABLET | Freq: Once | ORAL | Status: AC
  Administered 2015-01-21: 25 mg via ORAL
  Filled 2015-01-21: qty 1

## 2015-01-21 MED ORDER — LEVALBUTEROL HCL 1.25 MG/0.5ML IN NEBU
1.2500 mg | INHALATION_SOLUTION | Freq: Once | RESPIRATORY_TRACT | Status: AC
  Administered 2015-01-21: 1.25 mg via RESPIRATORY_TRACT
  Filled 2015-01-21: qty 0.5

## 2015-01-21 MED ORDER — ONETOUCH LANCETS MISC: Status: DC

## 2015-01-21 NOTE — Discharge Instructions (Signed)

## 2015-01-21 NOTE — ED Notes (Signed)
RT called

## 2015-01-21 NOTE — Telephone Encounter (Signed)
Called spoke with pt. She went to [facility] this AM. Was DX w/ asthma exacerbation. She reports they wanted to keep her but she refused bc she felt better. She saw Dr. Melvyn Novas on Friday for acute visit. I offered appt with TP this week but pt refused. She reports if she can't get an appt with Dr. Annamaria Boots on Friday then she doesn't want to be seen by this facility since she can't see him. Pt aware Dr. Annamaria Boots is out of the office until Wednesday to advise on a work in appt. Please advise Dr. Joni Reining

## 2015-01-21 NOTE — Telephone Encounter (Signed)
Left msg on triage pt is needing to get rx for glucose meter to check BS, and also order for Hospital bed. Sent BS monitor pls advise on order...Johny Chess

## 2015-01-21 NOTE — ED Notes (Signed)
Patient c/o dysuria while on bedpan, EDP notified.

## 2015-01-21 NOTE — ED Notes (Signed)
MD at bedside. 

## 2015-01-21 NOTE — ED Notes (Addendum)
Pt c/o of increased SOB. Pt had one neb tx and 5mg  Prednisone PTA. Pt sats 98% on room air. AOX4. Pt noted to have a dry, strong, non-productive cough.

## 2015-01-21 NOTE — ED Notes (Signed)
EMS here for transport

## 2015-01-21 NOTE — ED Notes (Signed)
D/c papers given and reviewed. Patient verbalized understanding. EMS called to transport [facility]

## 2015-01-21 NOTE — ED Notes (Signed)
Pt walked and talked with O2 stat of 97-100

## 2015-01-22 ENCOUNTER — Emergency Department (HOSPITAL_COMMUNITY)
Admission: EM | Admit: 2015-01-22 | Discharge: 2015-01-22 | Disposition: A | Discharge status: Other Acute Inpatient Hospital | Payer: Commercial Managed Care - HMO | Attending: Emergency Medicine | Admitting: Emergency Medicine

## 2015-01-22 ENCOUNTER — Encounter (HOSPITAL_COMMUNITY): Payer: Self-pay | Admitting: Emergency Medicine

## 2015-01-22 DIAGNOSIS — Z7982 Long term (current) use of aspirin: Secondary | ICD-10-CM | POA: Insufficient documentation

## 2015-01-22 DIAGNOSIS — Z794 Long term (current) use of insulin: Secondary | ICD-10-CM | POA: Diagnosis not present

## 2015-01-22 DIAGNOSIS — E039 Hypothyroidism, unspecified: Secondary | ICD-10-CM | POA: Diagnosis not present

## 2015-01-22 DIAGNOSIS — Z8739 Personal history of other diseases of the musculoskeletal system and connective tissue: Secondary | ICD-10-CM | POA: Diagnosis not present

## 2015-01-22 DIAGNOSIS — G40909 Epilepsy, unspecified, not intractable, without status epilepticus: Secondary | ICD-10-CM | POA: Diagnosis not present

## 2015-01-22 DIAGNOSIS — Z91040 Latex allergy status: Secondary | ICD-10-CM | POA: Insufficient documentation

## 2015-01-22 DIAGNOSIS — J45901 Unspecified asthma with (acute) exacerbation: Secondary | ICD-10-CM | POA: Insufficient documentation

## 2015-01-22 DIAGNOSIS — I10 Essential (primary) hypertension: Secondary | ICD-10-CM | POA: Diagnosis not present

## 2015-01-22 DIAGNOSIS — F419 Anxiety disorder, unspecified: Secondary | ICD-10-CM | POA: Insufficient documentation

## 2015-01-22 DIAGNOSIS — Z87891 Personal history of nicotine dependence: Secondary | ICD-10-CM | POA: Diagnosis not present

## 2015-01-22 DIAGNOSIS — F32A Depression, unspecified: Secondary | ICD-10-CM

## 2015-01-22 DIAGNOSIS — E119 Type 2 diabetes mellitus without complications: Secondary | ICD-10-CM | POA: Insufficient documentation

## 2015-01-22 DIAGNOSIS — K5900 Constipation, unspecified: Secondary | ICD-10-CM | POA: Insufficient documentation

## 2015-01-22 DIAGNOSIS — K219 Gastro-esophageal reflux disease without esophagitis: Secondary | ICD-10-CM | POA: Insufficient documentation

## 2015-01-22 DIAGNOSIS — F329 Major depressive disorder, single episode, unspecified: Secondary | ICD-10-CM | POA: Insufficient documentation

## 2015-01-22 DIAGNOSIS — Z79899 Other long term (current) drug therapy: Secondary | ICD-10-CM | POA: Diagnosis not present

## 2015-01-22 DIAGNOSIS — G8929 Other chronic pain: Secondary | ICD-10-CM | POA: Insufficient documentation

## 2015-01-22 DIAGNOSIS — J209 Acute bronchitis, unspecified: Secondary | ICD-10-CM

## 2015-01-22 DIAGNOSIS — R0602 Shortness of breath: Secondary | ICD-10-CM | POA: Diagnosis present

## 2015-01-22 LAB — CBC WITH DIFFERENTIAL/PLATELET
Basophils Absolute: 0 10*3/uL (ref 0.0–0.1)
Basophils Relative: 0 %
Eosinophils Absolute: 0.1 10*3/uL (ref 0.0–0.7)
Eosinophils Relative: 1 %
HCT: 37.7 % (ref 36.0–46.0)
Hemoglobin: 13 g/dL (ref 12.0–15.0)
Lymphocytes Relative: 24 %
Lymphs Abs: 2.1 10*3/uL (ref 0.7–4.0)
MCH: 31.9 pg (ref 26.0–34.0)
MCHC: 34.5 g/dL (ref 30.0–36.0)
MCV: 92.6 fL (ref 78.0–100.0)
Monocytes Absolute: 0.8 10*3/uL (ref 0.1–1.0)
Monocytes Relative: 9 %
Neutro Abs: 5.9 10*3/uL (ref 1.7–7.7)
Neutrophils Relative %: 66 %
Platelets: 186 10*3/uL (ref 150–400)
RBC: 4.07 MIL/uL (ref 3.87–5.11)
RDW: 13.4 % (ref 11.5–15.5)
WBC: 8.9 10*3/uL (ref 4.0–10.5)

## 2015-01-22 LAB — COMPREHENSIVE METABOLIC PANEL
ALT: 36 U/L (ref 14–54)
AST: 35 U/L (ref 15–41)
Albumin: 3.9 g/dL (ref 3.5–5.0)
Alkaline Phosphatase: 68 U/L (ref 38–126)
Anion gap: 9 (ref 5–15)
BUN: 11 mg/dL (ref 6–20)
CO2: 29 mmol/L (ref 22–32)
Calcium: 9.3 mg/dL (ref 8.9–10.3)
Chloride: 98 mmol/L — ABNORMAL LOW (ref 101–111)
Creatinine, Ser: 0.62 mg/dL (ref 0.44–1.00)
GFR calc Af Amer: >60 mL/min (ref >60)
GFR calc non Af Amer: >60 mL/min (ref >60)
Glucose, Bld: 137 mg/dL — ABNORMAL HIGH (ref 65–99)
Potassium: 3.4 mmol/L — ABNORMAL LOW (ref 3.5–5.1)
Sodium: 136 mmol/L (ref 135–145)
Total Bilirubin: 0.4 mg/dL (ref 0.3–1.2)
Total Protein: 7 g/dL (ref 6.5–8.1)

## 2015-01-22 LAB — RAPID URINE DRUG SCREEN, HOSP PERFORMED
Amphetamines: NOT DETECTED
Barbiturates: POSITIVE — AB
Benzodiazepines: NOT DETECTED
Cocaine: NOT DETECTED
Opiates: NOT DETECTED
Tetrahydrocannabinol: NOT DETECTED

## 2015-01-22 LAB — CBG MONITORING, ED
Glucose-Capillary: 134 mg/dL — ABNORMAL HIGH (ref 65–99)
Glucose-Capillary: 199 mg/dL — ABNORMAL HIGH (ref 65–99)

## 2015-01-22 LAB — ETHANOL: Alcohol, Ethyl (B): <5 mg/dL (ref <5)

## 2015-01-22 MED ORDER — INSULIN GLARGINE 100 UNIT/ML ~~LOC~~ SOLN
100.0000 [IU] | Freq: Every day | SUBCUTANEOUS | Status: DC
  Filled 2015-01-22: qty 1

## 2015-01-22 MED ORDER — VILAZODONE HCL 40 MG PO TABS: 40.0000 mg | ORAL_TABLET | Freq: Every day | ORAL | Status: DC

## 2015-01-22 MED ORDER — METOPROLOL SUCCINATE ER 50 MG PO TB24: 50.0000 mg | ORAL_TABLET | Freq: Every day | ORAL | Status: DC

## 2015-01-22 MED ORDER — INSULIN GLARGINE 300 UNIT/ML ~~LOC~~ SOPN: 100.0000 [IU] | PEN_INJECTOR | Freq: Every day | SUBCUTANEOUS | Status: DC

## 2015-01-22 MED ORDER — FESOTERODINE FUMARATE ER 8 MG PO TB24
8.0000 mg | ORAL_TABLET | Freq: Every day | ORAL | Status: DC
  Filled 2015-01-22: qty 1

## 2015-01-22 MED ORDER — ONDANSETRON 8 MG PO TBDP: 8.0000 mg | ORAL_TABLET | Freq: Three times a day (TID) | ORAL | Status: DC | PRN

## 2015-01-22 MED ORDER — LORATADINE 10 MG PO TABS: 10.0000 mg | ORAL_TABLET | Freq: Every day | ORAL | Status: DC

## 2015-01-22 MED ORDER — LEVOTHYROXINE SODIUM 50 MCG PO TABS: 75.0000 ug | ORAL_TABLET | Freq: Every day | ORAL | Status: DC

## 2015-01-22 MED ORDER — TRIAMTERENE-HCTZ 37.5-25 MG PO TABS: 1.0000 | ORAL_TABLET | Freq: Every day | ORAL | Status: DC

## 2015-01-22 MED ORDER — INSULIN ASPART 100 UNIT/ML ~~LOC~~ SOLN
10.0000 [IU] | Freq: Three times a day (TID) | SUBCUTANEOUS | Status: DC
  Administered 2015-01-22: 10 [IU] via SUBCUTANEOUS
  Filled 2015-01-22: qty 1

## 2015-01-22 MED ORDER — ALBUTEROL SULFATE (2.5 MG/3ML) 0.083% IN NEBU
2.5000 mg | INHALATION_SOLUTION | Freq: Four times a day (QID) | RESPIRATORY_TRACT | Status: DC | PRN
  Administered 2015-01-22 (×2): 2.5 mg via RESPIRATORY_TRACT
  Filled 2015-01-22 (×2): qty 3

## 2015-01-22 MED ORDER — TIZANIDINE HCL 4 MG PO TABS: 4.0000 mg | ORAL_TABLET | Freq: Every day | ORAL | Status: DC

## 2015-01-22 MED ORDER — INSULIN LISPRO 200 UNIT/ML ~~LOC~~ SOPN: 10.0000 [IU] | PEN_INJECTOR | Freq: Three times a day (TID) | SUBCUTANEOUS | Status: DC

## 2015-01-22 MED ORDER — PANTOPRAZOLE SODIUM 40 MG PO TBEC: 40.0000 mg | DELAYED_RELEASE_TABLET | Freq: Every day | ORAL | Status: DC

## 2015-01-22 MED ORDER — PREDNISONE 10 MG PO TABS
60.0000 mg | ORAL_TABLET | Freq: Every day | ORAL | Status: DC
  Administered 2015-01-22: 60 mg via ORAL
  Filled 2015-01-22 (×2): qty 1

## 2015-01-22 MED ORDER — LINAGLIPTIN 5 MG PO TABS
5.0000 mg | ORAL_TABLET | Freq: Every day | ORAL | Status: DC
  Filled 2015-01-22: qty 1

## 2015-01-22 NOTE — Telephone Encounter (Signed)
Called [facility] back inform md ok hospital bed. Requesting order to be fax to them @ [phone]. Generated order & fax to # given...Johny Chess

## 2015-01-22 NOTE — ED Notes (Signed)
Patient verbalizing understanding of discharge from [facility] to [facility]. Patient transferred [facility]

## 2015-01-22 NOTE — Progress Notes (Addendum)
Anderson Malta at [facility] will fax voluntary consent papers for patient to sign.  Awaiting voluntary consent from [place], per Anderson Malta, papers were faxed 15 min ago.

## 2015-01-22 NOTE — Progress Notes (Signed)
Patient referred for inpatient geriatric psych treatment at: [facility] - per [facility], accepting referrals. [facility] - per intake, fax referral for the waitlist. [facility] - per Anderson Malta, 1 adult female, 1 geriatric female and female bed. [facility] - per Vaughan Basta, fax referral. [facility] - left voicemail [facility] - per Zacarias Pontes, fax referral. [facility] - per Shirlee Limerick, fax referral for review.  At capacity: [facility] - per intake, call Darlene in am to check for beds.  CSW will continue to seek placement.  Verlon Setting, [facility] Disposition staff 01/22/2015 5:47 PM

## 2015-01-22 NOTE — ED Notes (Signed)
EMS reports they were called out for SOB. Pt 99% on arrival, no wheezing heard. EMS personnel reports pt has been talking constantly, became angry with resp assessment. Pt told EMS she took a 10 mg hydrocodone PTA.

## 2015-01-22 NOTE — Telephone Encounter (Signed)
Pt cb, stating she is needing appt ASAP, [phone]

## 2015-01-22 NOTE — Progress Notes (Signed)
Per Shirlee Limerick at [facility], to Dr. Ananias Pilgrim, bed [address]A, arrival time - anytime, call report [phone]. RN Larkin Ina aware.  Verlon Setting, [facility] Disposition staff 01/22/2015 10:43 PM
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
## 2015-01-22 NOTE — Progress Notes (Signed)
Shirlee Limerick at [facility] stated that they will be able to take her. Per Shirlee Limerick, will present case to MD and call back if patient can be accepted under [facility].  Verlon Setting, [place] Disposition staff 01/22/2015 6:46 PM
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
## 2015-01-22 NOTE — Telephone Encounter (Signed)
Dr. Ronnald Ramp Is it ok for Hospital bed...[name]

## 2015-01-22 NOTE — ED Notes (Signed)
Pt states she is having thoughts of harming herself. Was seen here yesterday for same c/o SOB and SI. Pt has hx of same complaints. Pt frequently becomes upset with family situation and becomes anxious.

## 2015-01-22 NOTE — Telephone Encounter (Signed)
yes

## 2015-01-22 NOTE — BH Assessment (Signed)
Per Laura, NP - patient meets criteria for inpatient hospitalization. CSW will seek placement.  

## 2015-01-22 NOTE — Telephone Encounter (Signed)
Spoke with pt and given appt with Dr young on Friday at 11:30 per [facility].

## 2015-01-22 NOTE — ED Notes (Signed)
Voluntary consent faxed to [facility] and Larence Penning [place] [phone]

## 2015-01-22 NOTE — ED Notes (Signed)
Pt insisting that she needs a breathing treatment.  Lungs auscultated and found to be clear.  Advised pt that her breathing treatment was not yet due.  Pt demanding to see Dr. Wyvonnia Dusky.  Informed pt that she had been cleared medically and was now being evaluated for her depression and thoughts of death.  Pt began yelling and demanding that she see someone for her breathing.  Pt states that she does not want to talk to me anymore and wants another nurse.  Cindy RN at bedside as well trying to calm patient.  Pt advised that she would have another nurse.
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
## 2015-01-22 NOTE — ED Provider Notes (Signed)
CSN: [id number]     Arrival date & time 01/21/15  5732 History   First MD Initiated Contact with Patient 01/21/15 [id number]     Chief Complaint  Patient presents with  . Shortness of Breath     (Consider location/radiation/quality/duration/timing/severity/associated sxs/prior Treatment) The history is provided by the patient.   [name] is a 64 y.o. female with past medical history as outlined below, most pertinent for a history of asthma presenting with increased cough, shortness of breath and production of thick white to clear mucus with coughing which she describes as "oysters".  She was seen by Dr. Melvyn Novas 3 days ago at which time her breathing was stable.  There is some concern that her wheezing may be acid reflux induced although she denies having any problems with reflux this am.  She took one albuterol neb tx and a 5 mg prednisone (takes daily) prior to arrival here. She denies chest pain, but endorses mid sternal pressure, noticed most when coughing, denies peripheral edema or leg pain, nausea, vomiting, palpitations or diaphoresis.  She does endorse increased tremor since using her neb tx.    Past Medical History  Diagnosis Date  . 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 [name], residual palpitations  . Vocal cord dysfunction   . Tremor, essential     takes Mysoline and Neurontin daily.  . Allergic rhinitis     uses Nasonex daily as needed  . Low sodium syndrome   . Chronic back pain     reason unknown  . Lumbar radiculopathy   . DDD (degenerative disc disease), lumbar   . Esophageal reflux     takes Zantac daily  . Hypertension     takes Losartan daily  . Anxiety     takes Ativan daily as needed  . Hypothyroidism     takes Synthroid daily  . Depression     takes Zoloft daily as well as Cymbalta  . Diabetes mellitus     takes NOvolin daily  . Asthma     Albuterol inhaler and Neb daily as needed  . History of  bronchitis Dec 2014  . Seizures ([place]) 04-05-13    one time ran out of Primidone and had stopped it cold [place]  . Weakness     in left arm  . Joint pain   . Joint swelling     left ankle  . Dysphagia   . Constipation   . Allergy to angiotensin receptor blockers (ARB) 01/22/2014    Angioedema  . 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 Rayann Heman 2007   Past Surgical History  Procedure Laterality Date  . Tonsillectomy    . Right breast cyst      benign  . Left ankle ligament repair      x 2  . Left elbow repair    . Partial hysterectomy    . Cholecystectomy    . Cesarean section    . Ablation for avnrt    . Colonoscopy  01/16/2004    [facility]:CWCBJS rectum/colon  . Esophagogastroduodenoscopy (egd) with esophageal dilation  04/03/2002    EGB:TDVVOHYW'V ring, otherwise normal esophagus, status post dilation with 54 F/Normal stomach  . Bartholin gland cyst excision      x 2  . Esophagogastroduodenoscopy (egd) with esophageal dilation N/A 11/08/2012    Procedure: ESOPHAGOGASTRODUODENOSCOPY (EGD) WITH ESOPHAGEAL DILATION;  Surgeon: Daneil Dolin, MD;  [facility];  Service: Endoscopy;  Laterality: N/A;  11:30  . Subthalamic stimulator insertion Bilateral 12/04/2013    Procedure: Bilateral Deep brain stimulator placement;  Surgeon: [name], MD;  Location: [place] [facility];  Service: Neurosurgery;  Laterality: Bilateral;  Bilateral Deep brain stimulator placement  . Pulse generator implant Bilateral 12/15/2013    Procedure: BILATERAL PULSE GENERATOR IMPLANT;  Surgeon: [name], MD;  Location: [place] [facility]UR[facility] ORS;  Service: Neurosurgery;  Laterality: Bilateral;  BILATERAL  . Maximum access (mas)posterior lumbar interbody fusion (plif) 1 level N/A 04/24/2014    Procedure: Lumbar four-five Maximum access Surgery posterior lumbar interbody fusion;  Surgeon: [name], MD;  Location: [place] [facility] ORS;  Service: Neurosurgery;  Laterality: N/A;  Lumbar four-five Maximum access Surgery  posterior lumbar interbody fusion   Family History  Problem Relation Age of Onset  . Lung cancer Father     DIED AGE 25 LUNG CA  . Alcohol abuse Father   . Anxiety disorder Father   . Depression Father   . COPD Mother     DIED AGE 39,MRSA,COPD,PNEUMONIA  . Pneumonia Mother     DIED AGE 39,MRSA,COPD,PNEUMONIA  . Supraventricular tachycardia Mother   . Anxiety disorder Mother   . Depression Mother   . Alcohol abuse Mother   . COPD Brother     AGE 40  . Heart disease Sister     DIED AGE 39, SMOKER,?HEART  . Colon cancer Neg Hx    Social History  Substance Use Topics  . Smoking status: Former Smoker -- 0.30 packs/day for 10 years    Types: Cigarettes    Quit date: 04/23/1993  . Smokeless tobacco: Never Used     Comment: quit smoking 25+yrs ago  . Alcohol Use: No     Comment: no alcohol in 22yr   OB History    Gravida Para Term Preterm AB TAB SAB Ectopic Multiple Living   '2 2 2       1     ' Review of Systems  Constitutional: Negative for fever and chills.  HENT: Negative.  Negative for congestion and sore throat.   Eyes: Negative.   Respiratory: Positive for cough, chest tightness and wheezing. Negative for shortness of breath.   Cardiovascular: Negative for chest pain and leg swelling.  Gastrointestinal: Negative for nausea, vomiting and abdominal pain.  Genitourinary: Negative.   Musculoskeletal: Negative for joint swelling, arthralgias and neck pain.  Skin: Negative.  Negative for rash and wound.  Neurological: Negative for dizziness, weakness, light-headedness, numbness and headaches.  Psychiatric/Behavioral: Negative.       Allergies  Invokana; Losartan; Metformin and related; Latex; Morphine and related; Other; and Oxycodone-acetaminophen  Home Medications   Prior to Admission medications   Medication Sig Start Date End Date Taking? Authorizing Provider  albuterol (PROVENTIL HFA;VENTOLIN HFA) 108 (90 BASE) MCG/ACT inhaler Inhale 2 puffs into the lungs  every 6 (six) hours as needed for wheezing or shortness of breath. 11/26/14  Yes SEzequiel Essex MD  albuterol (PROVENTIL) (2.5 MG/3ML) 0.083% nebulizer solution Take 3 mLs (2.5 mg total) by nebulization every 6 (six) hours as needed for wheezing or shortness of breath. 11/28/14  Yes CDeneise Lever MD  ASPIRIN LOW DOSE 81 MG EC tablet TAKE 1 TABLET BY MOUTH ONCE DAILY. 01/12/15  Yes TJanith Lima MD  fesoterodine (TOVIAZ) 8 MG TB24 tablet Take 1 tablet (8 mg total) by mouth daily. 12/31/14  Yes TJanith Lima MD  fexofenadine (ALLEGRA) 60 MG tablet Take 60 mg by mouth  2 (two) times daily.   Yes Historical Provider, MD  gabapentin (NEURONTIN) 300 MG capsule TAKE 1 CAPSULE BY MOUTH EVERY MORNING AND 2 CAPSULES AT BEDTIME. 01/12/15  Yes Janith Lima, MD  HYDROcodone-acetaminophen (NORCO) 10-325 MG per tablet Take 1 tablet by mouth every 6 (six) hours as needed for moderate pain. 12/25/14  Yes Janith Lima, MD  Insulin Glargine (TOUJEO SOLOSTAR) 300 UNIT/ML SOPN Inject 100 Units into the skin daily. 12/05/14  Yes Janith Lima, MD  Insulin Lispro, Human, (HUMALOG KWIKPEN) 200 UNIT/ML SOPN Inject 10 Units into the skin 3 (three) times daily with meals. 12/05/14  Yes Janith Lima, MD  JANUVIA 100 MG tablet TAKE 1 TABLET BY MOUTH ONCE DAILY. 01/12/15  Yes Janith Lima, MD  levofloxacin (LEVAQUIN) 500 MG tablet Take 1 tablet (500 mg total) by mouth daily. 01/12/15  Yes [name], MD  levothyroxine (SYNTHROID, LEVOTHROID) 75 MCG tablet TAKE (1) TABLET BY MOUTH ONCE DAILY BEFORE BREAKFAST. 01/12/15  Yes Janith Lima, MD  metoprolol succinate (TOPROL-XL) 50 MG 24 hr tablet TAKE 1 TABLET BY MOUTH ONCE DAILY. 01/12/15  Yes Janith Lima, MD  Multiple Vitamins-Minerals (MULTIVITAMIN PO) Take 1 tablet by mouth daily.   Yes Historical Provider, MD  omeprazole (PRILOSEC) 40 MG capsule Take 1 capsule (40 mg total) by mouth daily. 01/18/15  Yes Binnie Rail, MD  ondansetron (ZOFRAN ODT) 8 MG disintegrating  tablet Take 1 tablet (8 mg total) by mouth every 8 (eight) hours as needed for nausea or vomiting. 11/24/14  Yes Evalee Jefferson, PA-C  polyethylene glycol (MIRALAX / GLYCOLAX) packet Take 17 g by mouth daily as needed for mild constipation.   Yes Historical Provider, MD  potassium chloride SA (K-DUR,KLOR-CON) 20 MEQ tablet TAKE 1 TABLET BY MOUTH ONCE DAILY. 01/12/15  Yes Janith Lima, MD  primidone (MYSOLINE) 50 MG tablet TAKE 3 TABLETS (146m) BY MOUTH TWICE DAILY. 01/12/15  Yes TJanith Lima MD  ranitidine (ZANTAC) 150 MG tablet TAKE 1 TABLET BY MOUTH TWICE DAILY. 01/12/15  Yes TJanith Lima MD  tiZANidine (ZANAFLEX) 4 MG tablet Take 4 mg by mouth at bedtime.    Yes Historical Provider, MD  triamterene-hydrochlorothiazide (MAXZIDE-25) 37.5-25 MG tablet TAKE 1 TABLET BY MOUTH ONCE DAILY. 01/12/15  Yes TJanith Lima MD  VIIBRYD 40 MG TABS TAKE 1 TABLET BY MOUTH ONCE DAILY. 01/12/15  Yes TJanith Lima MD  Blood Glucose Monitoring Suppl (ONE TOUCH ULTRA 2) W/DEVICE KIT Use to check blood sugars daily Dx e11.9 01/21/15   TJanith Lima MD  glucose blood (ONE TOUCH TEST STRIPS) test strip 1 each by Other route 2 (two) times daily. Use to check blood sugars twice a day Dx E11.9 01/21/15   TJanith Lima MD  glucose blood test strip Use TID  E11.9 01/18/15   SBinnie Rail MD  glucose blood test strip Use TID E11.9 01/18/15   SBinnie Rail MD  Insulin Pen Needle (NOVOFINE) 30G X 8 MM MISC Use needle to inject insulin into the skin as directed. 12/25/14   TJanith Lima MD  ONE TOUCH LANCETS MISC Use to help check blood sugars twice a day Dx E11.9 01/21/15   TJanith Lima MD  predniSONE (DELTASONE) 10 MG tablet 3 tabs daily x 4 days 01/21/15   JEvalee Jefferson PA-C  Respiratory Therapy Supplies (FLUTTER) DEVI Use as directed 01/18/15   MTanda Rockers MD   BP 146/91 mmHg  Pulse  112  Temp(Src) 99.1 F (37.3 C) (Oral)  Resp 23  SpO2 100% Physical Exam  Constitutional: She appears well-developed  and well-nourished.  HENT:  Head: Normocephalic and atraumatic.  Eyes: Conjunctivae are normal.  Neck: Normal range of motion.  Cardiovascular: Normal rate, regular rhythm, normal heart sounds and intact distal pulses.   Pulmonary/Chest: Effort normal. No respiratory distress. She has wheezes. She has no rales. She exhibits no tenderness.  Expiratory wheeze throughout all lung field with prolonged respirations.  Few coarse scattered crackles which clear with cough.  Abdominal: Soft. Bowel sounds are normal. There is no tenderness.  Musculoskeletal: Normal range of motion. She exhibits no edema or tenderness.  No edema or ttp in lower extremities.  Neurological: She is alert.  Skin: Skin is warm and dry.  Psychiatric: She has a normal mood and affect.  Nursing note and vitals reviewed.   ED Course  Procedures (including critical care time) Labs Review Labs Reviewed  BASIC METABOLIC PANEL - Abnormal; Notable for the following:    Sodium 134 (*)    Chloride 96 (*)    Glucose, Bld 138 (*)    All other components within normal limits  CBC WITH DIFFERENTIAL/PLATELET  TROPONIN I  URINALYSIS, ROUTINE W REFLEX MICROSCOPIC (NOT AT [facility])    Imaging Review Dg Chest 2 View  01/21/2015  CLINICAL DATA:  Cough and shortness of breath.  Former smoker. EXAM: CHEST  2 VIEW COMPARISON:  01/12/2015 chest radiograph FINDINGS: Bilateral anterior upper chest stimulator devices are stable in configuration with partially visualized leads coursing into the neck soft tissues. Stable cardiomediastinal silhouette with normal heart size. No pneumothorax. No pleural effusion. Clear lungs, with no focal lung consolidation and no pulmonary edema. Cholecystectomy clips in the right upper quadrant of the abdomen. IMPRESSION: No active disease in the chest. Electronically Signed   By: Ilona Sorrel M.D.   On: 01/21/2015 11:01   I have personally reviewed and evaluated these images and lab results as part of my medical  decision-making.   EKG Interpretation   Date/Time:  Monday January 21 2015 09:38:27 EDT Ventricular Rate:  108 PR Interval:  167 QRS Duration: 92 QT Interval:  335 QTC Calculation: 449 R Axis:   72 Text Interpretation:  Sinus tachycardia No significant change since last  tracing Confirmed by NGUYEN, EMILY ([id number]) on 01/21/2015 10:34:15 AM      MDM   Final diagnoses:  Asthma exacerbation    Medications  predniSONE (DELTASONE) tablet 25 mg (25 mg Oral Given 01/21/15 1026)  levalbuterol (XOPENEX) nebulizer solution 1.25 mg (1.25 mg Nebulization Given 01/21/15 1049)  levalbuterol (XOPENEX) nebulizer solution 1.25 mg (1.25 mg Nebulization Given 01/21/15 1407)   Pt was given xopenex neb tx given tremor and mild tachy.  She was able to mobilize congestion after this tx, felt improved but still with expiratory wheeze, yet better aeration.  xopenex repeated x 1.  After second tx she felt much improved, no wheeze on exam, no crackles appreciated.  She ambulated without dyspnea, no desaturation.  She was given 25 mg prednisone to add to the 5 she already took today.  Prescribed additional prednisone with instructions to take 30 mg daily for 4 days, then return to her 5 mg daily.  Pt agreeable with plan and no complaint at time of dc.  Prn f/u anticipated.  Pt was also seen by Dr Alfonse Spruce during this visit.    Evalee Jefferson, PA-C 01/22/15 1249  Harvel Quale, MD 01/22/15 [phone]

## 2015-01-23 ENCOUNTER — Ambulatory Visit: Payer: Self-pay | Admitting: Internal Medicine

## 2015-01-25 ENCOUNTER — Ambulatory Visit: Payer: Self-pay | Admitting: Internal Medicine

## 2015-02-01 ENCOUNTER — Ambulatory Visit: Payer: Commercial Managed Care - HMO | Admitting: Gastroenterology

## 2015-02-04 ENCOUNTER — Telehealth: Payer: Self-pay | Admitting: Internal Medicine

## 2015-02-04 NOTE — Telephone Encounter (Signed)
Patient scheduled to come in on 02/08/15 at 11:30am per [facility].   Patient aware that she may have to wait a little longer to be seen since she is being worked in, advised to bring snack since she is diabetic Patient notified of appointment and will bring snack with her Nothing further needed. Closing encounter

## 2015-02-04 NOTE — Telephone Encounter (Signed)
Called and spoke to pt. Pt states she is unable to come in for appt with [facility] on 11.1.16 d/t transportation issues. Pt states she is able to come in Friday 11.4.16 or anytime next week.   Dr. Annamaria Boots please advise if pt can be worked in. Thanks.

## 2015-02-04 NOTE — Telephone Encounter (Signed)
Please have patient come in on Friday 02-08-15 at 11:30am; she is being worked in and could possibly have a longer wait time than usual. Thanks.

## 2015-02-05 ENCOUNTER — Ambulatory Visit: Payer: Self-pay | Admitting: Internal Medicine

## 2015-02-06 ENCOUNTER — Telehealth: Payer: Self-pay | Admitting: Internal Medicine

## 2015-02-06 DIAGNOSIS — N3281 Overactive bladder: Secondary | ICD-10-CM

## 2015-02-06 NOTE — Telephone Encounter (Signed)
Referral sent 

## 2015-02-06 NOTE — Telephone Encounter (Signed)
Patient is requesting a referral to urology. She is having trouble urinating. She states that she believes that she may need her urethra stretched again. She states that she has to walk a long way to the bathroom. She is experiencing bladder incontinence because of the time it takes to get to the bathroom.

## 2015-02-07 ENCOUNTER — Telehealth: Payer: Self-pay | Admitting: Internal Medicine

## 2015-02-07 DIAGNOSIS — E118 Type 2 diabetes mellitus with unspecified complications: Secondary | ICD-10-CM

## 2015-02-07 DIAGNOSIS — E119 Type 2 diabetes mellitus without complications: Secondary | ICD-10-CM

## 2015-02-07 MED ORDER — INSULIN LISPRO 200 UNIT/ML ~~LOC~~ SOPN: 10.0000 [IU] | PEN_INJECTOR | Freq: Three times a day (TID) | SUBCUTANEOUS | Status: DC

## 2015-02-07 MED ORDER — INSULIN PEN NEEDLE 30G X 8 MM MISC: Status: DC

## 2015-02-07 MED ORDER — INSULIN GLARGINE 300 UNIT/ML ~~LOC~~ SOPN: 100.0000 [IU] | PEN_INJECTOR | Freq: Every day | SUBCUTANEOUS | Status: DC

## 2015-02-07 NOTE — Telephone Encounter (Signed)
Patient requesting a fill on insulin and needles. She has an appt next week, but does not have enough to last her until her appt. She states that she would like to use novalog. Pharmacy is [facility] in [place].  Patient states that she needs a written document stating that she can take OTC medications. This is for her assisted living facility. She also states that this has to be on an RX pad.   She states that she wants [place] to get her med list from [facility]. They apparently have a different list than [place] that she states is more accurate? Advised that we would have to request after she fills out the form.

## 2015-02-07 NOTE — Telephone Encounter (Signed)
Dr. Ronnald Ramp is out of office. I refilled the insulin that was on her current med list. This will be pended for Dr. Ronnald Ramp for review of the letter that needs to be typed.

## 2015-02-08 ENCOUNTER — Ambulatory Visit (INDEPENDENT_AMBULATORY_CARE_PROVIDER_SITE_OTHER): Payer: Commercial Managed Care - HMO | Admitting: Internal Medicine

## 2015-02-08 ENCOUNTER — Encounter: Payer: Self-pay | Admitting: Internal Medicine

## 2015-02-08 ENCOUNTER — Telehealth: Payer: Self-pay | Admitting: Internal Medicine

## 2015-02-08 VITALS — BP 130/78 | HR 94 | Ht 64.5 in | Wt 178.0 lb

## 2015-02-08 DIAGNOSIS — G4733 Obstructive sleep apnea (adult) (pediatric): Secondary | ICD-10-CM

## 2015-02-08 DIAGNOSIS — J302 Other seasonal allergic rhinitis: Secondary | ICD-10-CM

## 2015-02-08 DIAGNOSIS — J4520 Mild intermittent asthma, uncomplicated: Secondary | ICD-10-CM

## 2015-02-08 DIAGNOSIS — IMO0001 Reserved for inherently not codable concepts without codable children: Secondary | ICD-10-CM

## 2015-02-08 DIAGNOSIS — J309 Allergic rhinitis, unspecified: Secondary | ICD-10-CM

## 2015-02-08 DIAGNOSIS — J3089 Other allergic rhinitis: Secondary | ICD-10-CM

## 2015-02-08 DIAGNOSIS — K219 Gastro-esophageal reflux disease without esophagitis: Secondary | ICD-10-CM | POA: Diagnosis not present

## 2015-02-08 MED ORDER — PANTOPRAZOLE SODIUM 20 MG PO TBEC: 20.0000 mg | DELAYED_RELEASE_TABLET | Freq: Two times a day (BID) | ORAL | Status: DC

## 2015-02-08 MED ORDER — SALINE SPRAY 0.65 % NA SOLN: NASAL | Status: DC

## 2015-02-08 MED ORDER — FEXOFENADINE HCL 60 MG PO TABS: 60.0000 mg | ORAL_TABLET | Freq: Two times a day (BID) | ORAL | Status: DC

## 2015-02-08 MED ORDER — FLUTICASONE FUROATE-VILANTEROL 100-25 MCG/INH IN AEPB: INHALATION_SPRAY | RESPIRATORY_TRACT | Status: DC

## 2015-02-08 NOTE — Patient Instructions (Addendum)
Prescription sent for Protonix 20 mg twice daily before meals to use instead of omeprazole.  Keep appointment with GI as discussed.  Printed prescriptions for Allegra and for nasal saline to use as directed  We will fax a prescription for a semi-electric hospital bed as requested  I have put Breo 100 inhaler back on your medicine list  Order schedule unattended home sleep test-DX OSA

## 2015-02-08 NOTE — Telephone Encounter (Signed)
Pt does not want vial. She wants her insulin in a pen

## 2015-02-08 NOTE — Assessment & Plan Note (Signed)
Feels better with Breo. Still has rescue inhaler and nebulizer machine.

## 2015-02-08 NOTE — Assessment & Plan Note (Signed)
Using Allegra and nasal saline spray

## 2015-02-08 NOTE — Assessment & Plan Note (Signed)
Denies history of prior sleep study. Reports loud snoring, daytime sleepiness and witnessed apneas. She depends on transport service and would have significant difficulty meeting the time schedule for an unattended sleep study because of her transport limitations. Plan-schedule unattended home sleep test

## 2015-02-08 NOTE — Telephone Encounter (Signed)
Patient is requesting levimir 10 ml vial. Did not see on current med list. Pharmacy is still the same

## 2015-02-08 NOTE — Assessment & Plan Note (Signed)
Pending return to GI. She doesn't feel well controlled with omeprazole. Preferred Protonix. Plan-Protonix twice daily before meals, semi-electric hospital bed as requested to permit sleep with head elevated
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
## 2015-02-08 NOTE — Telephone Encounter (Signed)
Requesting order to be faxed to [phone] stating that patient can self administer medication.

## 2015-02-09 ENCOUNTER — Emergency Department (HOSPITAL_COMMUNITY): Payer: Commercial Managed Care - HMO

## 2015-02-09 ENCOUNTER — Emergency Department (HOSPITAL_COMMUNITY)
Admission: EM | Admit: 2015-02-09 | Discharge: 2015-02-09 | Disposition: A | Discharge status: Home / Self Care | Payer: Commercial Managed Care - HMO | Attending: Emergency Medicine | Admitting: Emergency Medicine

## 2015-02-09 ENCOUNTER — Encounter (HOSPITAL_COMMUNITY): Payer: Self-pay

## 2015-02-09 DIAGNOSIS — Z87891 Personal history of nicotine dependence: Secondary | ICD-10-CM | POA: Diagnosis not present

## 2015-02-09 DIAGNOSIS — I10 Essential (primary) hypertension: Secondary | ICD-10-CM | POA: Diagnosis not present

## 2015-02-09 DIAGNOSIS — R251 Tremor, unspecified: Secondary | ICD-10-CM | POA: Diagnosis not present

## 2015-02-09 DIAGNOSIS — Z79899 Other long term (current) drug therapy: Secondary | ICD-10-CM | POA: Diagnosis not present

## 2015-02-09 DIAGNOSIS — E119 Type 2 diabetes mellitus without complications: Secondary | ICD-10-CM | POA: Diagnosis not present

## 2015-02-09 DIAGNOSIS — W06XXXA Fall from bed, initial encounter: Secondary | ICD-10-CM | POA: Diagnosis not present

## 2015-02-09 DIAGNOSIS — Z7951 Long term (current) use of inhaled steroids: Secondary | ICD-10-CM | POA: Insufficient documentation

## 2015-02-09 DIAGNOSIS — F329 Major depressive disorder, single episode, unspecified: Secondary | ICD-10-CM | POA: Insufficient documentation

## 2015-02-09 DIAGNOSIS — Z794 Long term (current) use of insulin: Secondary | ICD-10-CM | POA: Insufficient documentation

## 2015-02-09 DIAGNOSIS — G40909 Epilepsy, unspecified, not intractable, without status epilepticus: Secondary | ICD-10-CM | POA: Insufficient documentation

## 2015-02-09 DIAGNOSIS — G8929 Other chronic pain: Secondary | ICD-10-CM | POA: Insufficient documentation

## 2015-02-09 DIAGNOSIS — Z91040 Latex allergy status: Secondary | ICD-10-CM | POA: Diagnosis not present

## 2015-02-09 DIAGNOSIS — K5900 Constipation, unspecified: Secondary | ICD-10-CM | POA: Insufficient documentation

## 2015-02-09 DIAGNOSIS — S99922A Unspecified injury of left foot, initial encounter: Secondary | ICD-10-CM | POA: Diagnosis present

## 2015-02-09 DIAGNOSIS — S3992XA Unspecified injury of lower back, initial encounter: Secondary | ICD-10-CM | POA: Insufficient documentation

## 2015-02-09 DIAGNOSIS — Z7982 Long term (current) use of aspirin: Secondary | ICD-10-CM | POA: Diagnosis not present

## 2015-02-09 DIAGNOSIS — J45909 Unspecified asthma, uncomplicated: Secondary | ICD-10-CM | POA: Diagnosis not present

## 2015-02-09 DIAGNOSIS — S92515A Nondisplaced fracture of proximal phalanx of left lesser toe(s), initial encounter for closed fracture: Secondary | ICD-10-CM | POA: Diagnosis not present

## 2015-02-09 DIAGNOSIS — Y9289 Other specified places as the place of occurrence of the external cause: Secondary | ICD-10-CM | POA: Diagnosis not present

## 2015-02-09 DIAGNOSIS — R002 Palpitations: Secondary | ICD-10-CM | POA: Diagnosis not present

## 2015-02-09 DIAGNOSIS — K219 Gastro-esophageal reflux disease without esophagitis: Secondary | ICD-10-CM | POA: Diagnosis not present

## 2015-02-09 DIAGNOSIS — E039 Hypothyroidism, unspecified: Secondary | ICD-10-CM | POA: Insufficient documentation

## 2015-02-09 DIAGNOSIS — S92912A Unspecified fracture of left toe(s), initial encounter for closed fracture: Secondary | ICD-10-CM

## 2015-02-09 DIAGNOSIS — Y998 Other external cause status: Secondary | ICD-10-CM | POA: Diagnosis not present

## 2015-02-09 DIAGNOSIS — F419 Anxiety disorder, unspecified: Secondary | ICD-10-CM | POA: Diagnosis not present

## 2015-02-09 DIAGNOSIS — Y9389 Activity, other specified: Secondary | ICD-10-CM | POA: Insufficient documentation

## 2015-02-09 MED ORDER — HYDROCODONE-ACETAMINOPHEN 5-325 MG PO TABS
1.0000 | ORAL_TABLET | Freq: Once | ORAL | Status: AC
  Administered 2015-02-09: 1 via ORAL
  Filled 2015-02-09: qty 1

## 2015-02-09 NOTE — ED Provider Notes (Signed)
CSN: [id number]     Arrival date & time 02/09/15  0917 History   First MD Initiated Contact with Patient 02/09/15 [phone]     Chief Complaint  Patient presents with  . Toe Pain     (Consider location/radiation/quality/duration/timing/severity/associated sxs/prior Treatment) HPI Comments: Patient states that approximately 245 this morning she slid out of bed, but did not injure herself. When she was attempting to get up, her ankle rolled on her, and her weight came down on the left foot. She noted immediate swelling of the toes of the left foot and severe pain from the foot up to the mid calf area. She was unable to apply weight without severe pain even using her walker. She presents now because the pain is not improving. She tried Tylenol without success. She then tried Norco and states that this was very minimal in its help. She presents now for evaluation of this pain.  Patient is a 64 y.o. female presenting with toe pain. The history is provided by the patient.  Toe Pain Associated symptoms include arthralgias.    Past Medical History  Diagnosis Date  . 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 [name], residual palpitations  . Vocal cord dysfunction   . Tremor, essential     takes Mysoline and Neurontin daily.  . Allergic rhinitis     uses Nasonex daily as needed  . Low sodium syndrome   . Chronic back pain     reason unknown  . Lumbar radiculopathy   . DDD (degenerative disc disease), lumbar   . Esophageal reflux     takes Zantac daily  . Hypertension     takes Losartan daily  . Anxiety     takes Ativan daily as needed  . Hypothyroidism     takes Synthroid daily  . Depression     takes Zoloft daily as well as Cymbalta  . Diabetes mellitus     takes NOvolin daily  . Asthma     Albuterol inhaler and Neb daily as needed  . History of bronchitis Dec 2014  . Seizures ([place]) 04-05-13    one time ran out of Primidone and had stopped  it cold [place]  . Weakness     in left arm  . Joint pain   . Joint swelling     left ankle  . Dysphagia   . Constipation   . Allergy to angiotensin receptor blockers (ARB) 01/22/2014    Angioedema  . 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 Rayann Heman 2007   Past Surgical History  Procedure Laterality Date  . Tonsillectomy    . Right breast cyst      benign  . Left ankle ligament repair      x 2  . Left elbow repair    . Partial hysterectomy    . Cholecystectomy    . Cesarean section    . Ablation for avnrt    . Colonoscopy  01/16/2004    QMV:HQIONG rectum/colon  . Esophagogastroduodenoscopy (egd) with esophageal dilation  04/03/2002    EXB:[id number]'[id number] ring, otherwise normal esophagus, status post dilation with 54 F/Normal stomach  . Bartholin gland cyst excision      x 2  . Esophagogastroduodenoscopy (egd) with esophageal dilation N/A 11/08/2012    Procedure: ESOPHAGOGASTRODUODENOSCOPY (EGD) WITH ESOPHAGEAL DILATION;  Surgeon: Daneil Dolin, MD;  [facility];  Service: Endoscopy;  Laterality: N/A;  11:30  .  Subthalamic stimulator insertion Bilateral 12/04/2013    Procedure: Bilateral Deep brain stimulator placement;  Surgeon: [name], MD;  Location: [place] [facility];  Service: Neurosurgery;  Laterality: Bilateral;  Bilateral Deep brain stimulator placement  . Pulse generator implant Bilateral 12/15/2013    Procedure: BILATERAL PULSE GENERATOR IMPLANT;  Surgeon: [name], MD;  Location: [place] [facility];  Service: Neurosurgery;  Laterality: Bilateral;  BILATERAL  . Maximum access (mas)posterior lumbar interbody fusion (plif) 1 level N/A 04/24/2014    Procedure: Lumbar four-five Maximum access Surgery posterior lumbar interbody fusion;  Surgeon: [name], MD;  Location: [place] [facility];  Service: Neurosurgery;  Laterality: N/A;  Lumbar four-five Maximum access Surgery posterior lumbar interbody fusion   Family History  Problem Relation Age of Onset  . Lung cancer  Father     DIED AGE 60 LUNG CA  . Alcohol abuse Father   . Anxiety disorder Father   . Depression Father   . COPD Mother     DIED AGE 65,MRSA,COPD,PNEUMONIA  . Pneumonia Mother     DIED AGE 65,MRSA,COPD,PNEUMONIA  . Supraventricular tachycardia Mother   . Anxiety disorder Mother   . Depression Mother   . Alcohol abuse Mother   . COPD Brother     AGE 71  . Heart disease Sister     DIED AGE 48, SMOKER,?HEART  . Colon cancer Neg Hx    Social History  Substance Use Topics  . Smoking status: Former Smoker -- 0.30 packs/day for 10 years    Types: Cigarettes    Quit date: 04/23/1993  . Smokeless tobacco: Never Used     Comment: quit smoking 25+yrs ago  . Alcohol Use: No     Comment: no alcohol in 35yr   OB History    Gravida Para Term Preterm AB TAB SAB Ectopic Multiple Living   _0 Review of Systems  Cardiovascular: Positive for palpitations.  Musculoskeletal: Positive for back pain and arthralgias.  Neurological: Positive for tremors.  Psychiatric/Behavioral: The patient is nervous/anxious.   All other systems reviewed and are negative.     Allergies  Invokana; Losartan; Metformin and related; Latex; Morphine and related; Other; and Oxycodone-acetaminophen  Home Medications   Prior to Admission medications   Medication Sig Start Date End Date Taking? Authorizing Provider  ASPIRIN LOW DOSE 81 MG EC tablet TAKE 1 TABLET BY MOUTH ONCE DAILY. 01/12/15  Yes TJanith Lima MD  buPROPion (WELLBUTRIN XL) 150 MG 24 hr tablet Take 150 mg by mouth daily.   Yes Historical Provider, MD  fesoterodine (TOVIAZ) 8 MG TB24 tablet Take 1 tablet (8 mg total) by mouth daily. 12/31/14  Yes TJanith Lima MD  fexofenadine (ALLEGRA) 60 MG tablet Take 1 tablet (60 mg total) by mouth 2 (two) times daily. 02/08/15  Yes CDeneise Lever MD  Fluticasone Furoate-Vilanterol (BREO ELLIPTA) 100-25 MCG/INH AEPB Inhale 1 puff then rinse mouth, once daily 02/08/15  Yes Clinton D Young, MD   gabapentin (NEURONTIN) 300 MG capsule TAKE 1 CAPSULE BY MOUTH EVERY MORNING AND 2 CAPSULES AT BEDTIME. 01/12/15  Yes TJanith Lima MD  HYDROcodone-acetaminophen (NORCO) 10-325 MG per tablet Take 1 tablet by mouth every 6 (six) hours as needed for moderate pain. 12/25/14  Yes TJanith Lima MD  insulin detemir (LEVEMIR) 100 UNIT/ML injection Inject 40 Units into the skin at bedtime.   Yes Historical Provider, MD  Insulin Lispro (HUMALOG KWIKPEN) 200  UNIT/ML SOPN Inject 10 Units into the skin 3 (three) times daily with meals. 02/07/15  Yes Janith Lima, MD  lamoTRIgine (LAMICTAL) 25 MG tablet Take 25 mg by mouth daily.   Yes Historical Provider, MD  levothyroxine (SYNTHROID, LEVOTHROID) 75 MCG tablet TAKE (1) TABLET BY MOUTH ONCE DAILY BEFORE BREAKFAST. 01/12/15  Yes Janith Lima, MD  metoprolol succinate (TOPROL-XL) 50 MG 24 hr tablet TAKE 1 TABLET BY MOUTH ONCE DAILY. 01/12/15  Yes Janith Lima, MD  Multiple Vitamins-Minerals (MULTIVITAMIN PO) Take 1 tablet by mouth daily.   Yes Historical Provider, MD  pantoprazole (PROTONIX) 20 MG tablet Take 1 tablet (20 mg total) by mouth 2 (two) times daily. Before meals 02/08/15  Yes Deneise Lever, MD  potassium chloride SA (K-DUR,KLOR-CON) 20 MEQ tablet TAKE 1 TABLET BY MOUTH ONCE DAILY. 01/12/15  Yes Janith Lima, MD  primidone (MYSOLINE) 50 MG tablet TAKE 3 TABLETS ($RemoveBe'150mg'ofiGzfHfT$ ) BY MOUTH TWICE DAILY. 01/12/15  Yes Janith Lima, MD  QUEtiapine (SEROQUEL) 50 MG tablet Take 50 mg by mouth at bedtime.   Yes Historical Provider, MD  ranitidine (ZANTAC) 150 MG tablet TAKE 1 TABLET BY MOUTH TWICE DAILY. 01/12/15  Yes Janith Lima, MD  tiZANidine (ZANAFLEX) 4 MG tablet Take 4 mg by mouth at bedtime.    Yes Historical Provider, MD  triamterene-hydrochlorothiazide (MAXZIDE-25) 37.5-25 MG tablet TAKE 1 TABLET BY MOUTH ONCE DAILY. 01/12/15  Yes Janith Lima, MD  albuterol (PROVENTIL HFA;VENTOLIN HFA) 108 (90 BASE) MCG/ACT inhaler Inhale 2 puffs into the lungs every  6 (six) hours as needed for wheezing or shortness of breath. 11/26/14   Ezequiel Essex, MD  albuterol (PROVENTIL) (2.5 MG/3ML) 0.083% nebulizer solution Take 3 mLs (2.5 mg total) by nebulization every 6 (six) hours as needed for wheezing or shortness of breath. 11/28/14   Deneise Lever, MD  Blood Glucose Monitoring Suppl (ONE TOUCH ULTRA 2) W/DEVICE KIT Use to check blood sugars daily Dx e11.9 01/21/15   Janith Lima, MD  glucose blood (ONE TOUCH TEST STRIPS) test strip 1 each by Other route 2 (two) times daily. Use to check blood sugars twice a day Dx E11.9 01/21/15   Janith Lima, MD  glucose blood test strip Use TID  E11.9 01/18/15   Binnie Rail, MD  glucose blood test strip Use TID E11.9 01/18/15   Binnie Rail, MD  Insulin Glargine (TOUJEO SOLOSTAR) 300 UNIT/ML SOPN Inject 100 Units into the skin daily. Patient not taking: Reported on 02/09/2015 02/07/15   Janith Lima, MD  Insulin Pen Needle (NOVOFINE) 30G X 8 MM MISC Use needle to inject insulin into the skin as directed. 02/07/15   Janith Lima, MD  ondansetron (ZOFRAN ODT) 8 MG disintegrating tablet Take 1 tablet (8 mg total) by mouth every 8 (eight) hours as needed for nausea or vomiting. 11/24/14   Evalee Jefferson, PA-C  ONE TOUCH LANCETS MISC Use to help check blood sugars twice a day Dx E11.9 01/21/15   Janith Lima, MD  polyethylene glycol (MIRALAX / Floria Raveling) packet Take 17 g by mouth daily as needed for mild constipation.    Historical Provider, MD  Respiratory Therapy Supplies (FLUTTER) DEVI Use as directed 01/18/15   Tanda Rockers, MD  sodium chloride (OCEAN) 0.65 % SOLN nasal spray 1-2 sprays each nostril up to 4 times daily as needed 02/08/15   Deneise Lever, MD  sucralfate (CARAFATE) 1 G tablet Take 1 g by  mouth 4 (four) times daily. 02/01/15   Historical Provider, MD  VIIBRYD 40 MG TABS Take 40 mg by mouth daily. 02/08/15   Historical Provider, MD   BP 127/75 mmHg  Pulse 92  Temp(Src) 98.4 F (36.9 C) (Oral)   Resp 18  Ht _0  (1.626 m)  Wt 179 lb (81.194 kg)  BMI 30.71 kg/m2  SpO2 100% Physical Exam  Constitutional: She is oriented to person, place, and time. She appears well-developed and well-nourished.  Non-toxic appearance.  HENT:  Head: Normocephalic.  Right Ear: Tympanic membrane and external ear normal.  Left Ear: Tympanic membrane and external ear normal.  Eyes: EOM and lids are normal. Pupils are equal, round, and reactive to light.  Neck: Normal range of motion. Neck supple. Carotid bruit is not present.  Cardiovascular: Normal rate, regular rhythm, normal heart sounds, intact distal pulses and normal pulses.   Pulmonary/Chest: Breath sounds normal. No respiratory distress.  Abdominal: Soft. Bowel sounds are normal. There is no tenderness. There is no guarding.  Musculoskeletal: Normal range of motion.  The second third and fourth toes are swollen. Tender to palpation or attempted movement. There is mild swelling of the dorsum of the left foot just behind the second third and fourth toes. The Achilles tendon is intact. There is soreness to the lateral calf area, but no swelling appreciated. There is good range of motion of the knee and hip on the left. There is good range of motion of the toes, ankle, knees, and hip on the right.  Lymphadenopathy:       Head (right side): No submandibular adenopathy present.       Head (left side): No submandibular adenopathy present.    She has no cervical adenopathy.  Neurological: She is alert and oriented to person, place, and time. She has normal strength. No cranial nerve deficit or sensory deficit.  Skin: Skin is warm and dry.  Psychiatric: She has a normal mood and affect. Her speech is normal.  Nursing note and vitals reviewed.   ED Course    Procedures (including critical care time)  FRACTURE CARE LEFT FOOT. Fractures of the left second third and fourth toe demonstrated to the patient on the x-rays. The procedure was explained to the  patient in terms of which she understands for application of a Watson Jones splint, and an postoperative shoe. The patient is identified by arm band. The patient is fitted with the Watson-Jones splint and the postoperative shoe without problem. Patient tolerated the procedure without problem. The patient has pain medication at her assisted living facility. A prescription for wheelchair has been given. Labs Review Labs Reviewed - No data to display  Imaging Review Dg Foot Complete Left  02/09/2015  CLINICAL DATA:  64 year old female reportedly slipped out of bed this morning at 2:45 injuring the toes on her left foot complaining of pain. EXAM: LEFT FOOT - COMPLETE 3+ VIEW COMPARISON:  No priors. FINDINGS: Three views of the left foot demonstrate acute nondisplaced fractures through the proximal aspect of the second, third and fourth proximal phalanges. These do not appear to extend to the articular surface. Overlying soft tissues appear mildly swollen. No other acute displaced fractures are noted. Mild degenerative changes of osteoarthritis are noted, most pronounced midfoot. Small plantar calcaneal spur incidentally noted. IMPRESSION: 1. Nondisplaced fractures through the proximal aspects of the second, third and fourth proximal phalanges. Electronically Signed   By: Vinnie Langton M.D.   On: 02/09/2015 09:58   I have  personally reviewed and evaluated these images and lab results as part of my medical decision-making.   EKG Interpretation None      MDM  X-ray of the left foot reveals nondisplaced fractures of the second, third, and fourth phalanges. The patient was fitted with a Watson-Jones splint and postoperative shoe. A prescription for a wheelchair is also given to the patient. The patient has pain medication at her assisted living facility. She will follow-up with her orthopedic group in [facility] next week.    Final diagnoses:  None    **I have reviewed nursing notes,  vital signs, and all appropriate lab and imaging results for this patient.Lily Kocher, PA-C 02/09/15 1114  [name], MD 02/09/15 1500

## 2015-02-09 NOTE — ED Notes (Signed)
Pt unable to find transportation home. Request EMS be called to transport her

## 2015-02-09 NOTE — ED Notes (Signed)
Pt calling for transportation ack home

## 2015-02-09 NOTE — ED Notes (Signed)
Pt states she slipped out of bed around 0245 this morning and hit her toes on the left foot. Sates she took a norco pain pill 10/325 when she fell

## 2015-02-09 NOTE — Discharge Instructions (Signed)
You have a fracture of the second, third, and fourth toes of your left foot. Please see your orthopedic specialist in [place] some as possible. Please keep your foot elevated above your waist. Use your pudding shoe when up and about. Toe Fracture A toe fracture is a break in one of the toe bones (phalanges). HOME CARE If You Have a Cast:  Do not stick anything inside the cast to scratch your skin.  Check the skin around the cast every day. Tell your doctor about any concerns. Do not put lotion on the skin underneath the cast. You may put lotion on dry skin around the edges of the cast.  Do not put pressure on any part of the cast until it is fully hardened. This may take many hours.  Keep the cast clean and dry. Bathing  Do not take baths, swim, or use a hot tub until your doctor says that you can. Ask your doctor if you can take showers. You may only be allowed to take sponge baths for bathing.  If your doctor says that bathing and showering are okay, cover the cast or bandage (dressing) with a watertight plastic bag to protect it from water. Do not let the cast or bandage get wet. Managing Pain, Stiffness, and Swelling  If you do not have a cast, put ice on the injured area if told by your doctor:  Put ice in a plastic bag.  Place a towel between your skin and the bag.  Leave the ice on for 20 minutes, 2-3 times per day.  Move your toes often to avoid stiffness and to lessen swelling.  Raise (elevate) the injured area above the level of your heart while you are sitting or lying down. Driving  Do not drive or use heavy machinery while taking pain medicine.  Do not drive while wearing a cast on a foot that you use for driving. Activity  Return to your normal activities as told by your doctor. Ask your doctor what activities are safe for you.  Perform exercises daily as told by your doctor or therapist. Safety  Do not use your leg to support your body weight until your  doctor says that you can. Use crutches or other tools to help you move around as told by your doctor. General Instructions  If your toe was taped to a toe that is next to it (buddy taping), follow your doctor's instructions for changing the gauze and tape. Change it more often:  If the gauze and tape get wet. If this happens, dry the space between the toes.  If the gauze and tape are too tight and they cause your toe to become pale or to lose feeling (numb).  Wear a protective shoe as told by your doctor. If you were not given one, wear sturdy shoes that support your foot. Your shoes should not pinch your toes. Your shoes should not fit tightly against your toes.  Do not use any tobacco products, including cigarettes, chewing tobacco, or e-cigarettes. Tobacco can delay bone healing. If you need help quitting, ask your doctor.  Take medicines only as told by your doctor.  Keep all follow-up visits as told by your doctor. This is important. GET HELP IF:  You have a fever.  Your pain medicine is not helping.  Your toe feels cold.  You lose feeling (have numbness) in your toe.  You still have pain after one week of rest and treatment.  You still have pain after  your doctor has said that you can start walking again.  You have pain or tingling in your foot, and it is not going away.  You have loss of feeling in your foot, and it is not going away. GET HELP RIGHT AWAY IF:  You have severe pain.  You have redness or swelling (inflammation) in your toe, and it is getting worse.  You have pain or loss of feeling in your toe, and it is getting worse.  Your toe is blue.   This information is not intended to replace advice given to you by your health care provider. Make sure you discuss any questions you have with your health care provider.   Document Released: 09/09/2007 Document Revised: 08/07/2014 Document Reviewed: 01/17/2014 Elsevier Interactive Patient Education [employer].

## 2015-02-09 NOTE — ED Notes (Signed)
Pt has redness around nail on left great toe. States she cut her nail to short

## 2015-02-11 ENCOUNTER — Telehealth: Payer: Self-pay | Admitting: Internal Medicine

## 2015-02-11 NOTE — Telephone Encounter (Signed)
Called back and spoke with pt.  She is waiting for an appt with Dr. Doran Durand @[facility].  Patient states she may need surgery on one of the broken toes.  Pt would like to put a hold on the HST Order that was placed by Dr. Annamaria Boots on 02/08/15 until she knows the outcome of her appt with Dr. Doran Durand.  She will call to advise [place] once she has her ortho appt.  I will also advise Clayborne Dana, CMA/Dr. Annamaria Boots of patient's current status.

## 2015-02-11 NOTE — Telephone Encounter (Signed)
ok 

## 2015-02-11 NOTE — Telephone Encounter (Signed)
I attempted to call patient at the phone number she left but this is a fax line.  Other number listed on pt appt desk is no longer valid for this pt .

## 2015-02-11 NOTE — Telephone Encounter (Signed)
I called back to notify. They are asking for something to be faxed over with decline.

## 2015-02-11 NOTE — Telephone Encounter (Signed)
done

## 2015-02-11 NOTE — Telephone Encounter (Signed)
Dr. Ronnald Ramp declined. Not sure if Ms. Evans needs to be informed or her facility

## 2015-02-12 ENCOUNTER — Telehealth: Payer: Self-pay | Admitting: Internal Medicine

## 2015-02-12 ENCOUNTER — Encounter: Payer: Self-pay | Admitting: Internal Medicine

## 2015-02-12 NOTE — Telephone Encounter (Signed)
Rx for each has been hand written by CY and placed up front for pick up tomorrow. Please let patient know. Thanks.

## 2015-02-12 NOTE — Telephone Encounter (Signed)
Per Joellen Jersey, patient notified that hand-written rx has been left at front desk for her to pick up. Nothing further needed. Closing encounter

## 2015-02-12 NOTE — Telephone Encounter (Signed)
Spoke with pt, states she is requesting a handwritten rx for tums and gaviscon.  Pt wishes to pick up these rx's in office tomorrow.  Pt states that her assisted living facility requires her to have a rx for all medications she takes (otc or prescribed).  Neither of these meds are on her medication list.  Pt states she takes these both prn.    CY are you ok writing a rx for these medications?  Thanks!

## 2015-02-13 ENCOUNTER — Ambulatory Visit (INDEPENDENT_AMBULATORY_CARE_PROVIDER_SITE_OTHER): Payer: Commercial Managed Care - HMO | Admitting: Internal Medicine

## 2015-02-13 ENCOUNTER — Encounter: Payer: Self-pay | Admitting: Internal Medicine

## 2015-02-13 ENCOUNTER — Other Ambulatory Visit (INDEPENDENT_AMBULATORY_CARE_PROVIDER_SITE_OTHER): Payer: Commercial Managed Care - HMO

## 2015-02-13 VITALS — BP 144/82 | HR 90 | Temp 97.8°F | Resp 16 | Ht 64.0 in | Wt 176.0 lb

## 2015-02-13 DIAGNOSIS — E118 Type 2 diabetes mellitus with unspecified complications: Secondary | ICD-10-CM | POA: Diagnosis not present

## 2015-02-13 DIAGNOSIS — E038 Other specified hypothyroidism: Secondary | ICD-10-CM

## 2015-02-13 DIAGNOSIS — E785 Hyperlipidemia, unspecified: Secondary | ICD-10-CM

## 2015-02-13 DIAGNOSIS — Z794 Long term (current) use of insulin: Secondary | ICD-10-CM | POA: Diagnosis not present

## 2015-02-13 DIAGNOSIS — I10 Essential (primary) hypertension: Secondary | ICD-10-CM

## 2015-02-13 LAB — URINALYSIS, ROUTINE W REFLEX MICROSCOPIC
Bilirubin Urine: NEGATIVE
Hgb urine dipstick: NEGATIVE
Ketones, ur: NEGATIVE
Leukocytes, UA: NEGATIVE
Nitrite: NEGATIVE
RBC / HPF: NONE SEEN (ref 0–?)
Specific Gravity, Urine: 1.01 (ref 1.000–1.030)
Total Protein, Urine: NEGATIVE
Urine Glucose: NEGATIVE
Urobilinogen, UA: 0.2 (ref 0.0–1.0)
pH: 6 (ref 5.0–8.0)

## 2015-02-13 LAB — MICROALBUMIN / CREATININE URINE RATIO
Creatinine,U: 66.1 mg/dL
Microalb Creat Ratio: 2.6 mg/g (ref 0.0–30.0)
Microalb, Ur: 1.7 mg/dL (ref 0.0–1.9)

## 2015-02-13 LAB — BASIC METABOLIC PANEL
BUN: 12 mg/dL (ref 6–23)
CO2: 25 mEq/L (ref 19–32)
Calcium: 9.6 mg/dL (ref 8.4–10.5)
Chloride: 96 mEq/L (ref 96–112)
Creatinine, Ser: 0.66 mg/dL (ref 0.40–1.20)
GFR: 95.15 mL/min (ref >60.00)
Glucose, Bld: 228 mg/dL — ABNORMAL HIGH (ref 70–99)
Potassium: 4 mEq/L (ref 3.5–5.1)
Sodium: 132 mEq/L — ABNORMAL LOW (ref 135–145)

## 2015-02-13 LAB — LIPID PANEL
Cholesterol: 180 mg/dL (ref 0–200)
HDL: 64.8 mg/dL (ref >39.00)
LDL Cholesterol: 80 mg/dL (ref 0–99)
NonHDL: 115.17
Total CHOL/HDL Ratio: 3
Triglycerides: 175 mg/dL — ABNORMAL HIGH (ref 0.0–149.0)
VLDL: 35 mg/dL (ref 0.0–40.0)

## 2015-02-13 LAB — TSH: TSH: 1.18 u[IU]/mL (ref 0.35–4.50)

## 2015-02-13 LAB — HEMOGLOBIN A1C: Hgb A1c MFr Bld: 7 % — ABNORMAL HIGH (ref 4.6–6.5)

## 2015-02-13 MED ORDER — LOPERAMIDE HCL 2 MG PO CAPS: 2.0000 mg | ORAL_CAPSULE | ORAL | Status: DC | PRN

## 2015-02-13 MED ORDER — ARNICARE EX GEL: 1.0000 | Freq: Every day | CUTANEOUS | Status: DC

## 2015-02-13 MED ORDER — INSULIN DETEMIR 100 UNIT/ML FLEXPEN: 50.0000 [IU] | PEN_INJECTOR | Freq: Every day | SUBCUTANEOUS | Status: DC

## 2015-02-13 MED ORDER — BOOST DIABETIC PO LIQD: 1.0000 | Freq: Every day | ORAL | Status: DC

## 2015-02-13 MED ORDER — NEOMYCIN-POLYMYXIN-PRAMOXINE 1 % EX CREA: TOPICAL_CREAM | Freq: Three times a day (TID) | CUTANEOUS | Status: DC

## 2015-02-13 MED ORDER — INSULIN PEN NEEDLE 30G X 8 MM MISC: Status: DC

## 2015-02-13 NOTE — Telephone Encounter (Signed)
Rec'd from [facility] forward 49 pages to [place]

## 2015-02-13 NOTE — Progress Notes (Signed)
Subjective:  Patient ID: [name], female    DOB: 25-Jul-1950  Age: 64 y.o. MRN: [id number]  CC: Hypertension; Hyperlipidemia; and Diabetes   HPI [name] presents for follow-up on hypertension and high cholesterol and diabetes. Since I last saw her she has injured her left foot, she was seen in emergency room and has broken toes. She complains of bruising and swelling and says she can't put weight on the left foot. She has an appointment soon with an orthopedist to evaluate and treat this. She offers no other new complaints today.  Outpatient Prescriptions Prior to Visit  Medication Sig Dispense Refill  . albuterol (PROVENTIL HFA;VENTOLIN HFA) 108 (90 BASE) MCG/ACT inhaler Inhale 2 puffs into the lungs every 6 (six) hours as needed for wheezing or shortness of breath. 1 Inhaler 2  . albuterol (PROVENTIL) (2.5 MG/3ML) 0.083% nebulizer solution Take 3 mLs (2.5 mg total) by nebulization every 6 (six) hours as needed for wheezing or shortness of breath. 75 mL 12  . ASPIRIN LOW DOSE 81 MG EC tablet TAKE 1 TABLET BY MOUTH ONCE DAILY. 30 tablet 11  . Blood Glucose Monitoring Suppl (ONE TOUCH ULTRA 2) W/DEVICE KIT Use to check blood sugars daily Dx e11.9 1 each 0  . buPROPion (WELLBUTRIN XL) 150 MG 24 hr tablet Take 150 mg by mouth daily.    . fesoterodine (TOVIAZ) 8 MG TB24 tablet Take 1 tablet (8 mg total) by mouth daily. 30 tablet 11  . fexofenadine (ALLEGRA) 60 MG tablet Take 1 tablet (60 mg total) by mouth 2 (two) times daily. 60 tablet 11  . Fluticasone Furoate-Vilanterol (BREO ELLIPTA) 100-25 MCG/INH AEPB Inhale 1 puff then rinse mouth, once daily 60 each 11  . gabapentin (NEURONTIN) 300 MG capsule TAKE 1 CAPSULE BY MOUTH EVERY MORNING AND 2 CAPSULES AT BEDTIME. 90 capsule 11  . glucose blood (ONE TOUCH TEST STRIPS) test strip 1 each by Other route 2 (two) times daily. Use to check blood sugars twice a day Dx E11.9 100 each 3  . glucose blood test strip Use TID  E11.9 100 each  12  . glucose blood test strip Use TID E11.9 100 each 12  . HYDROcodone-acetaminophen (NORCO) 10-325 MG per tablet Take 1 tablet by mouth every 6 (six) hours as needed for moderate pain. 75 tablet 0  . Insulin Lispro (HUMALOG KWIKPEN) 200 UNIT/ML SOPN Inject 10 Units into the skin 3 (three) times daily with meals. 9 mL 3  . lamoTRIgine (LAMICTAL) 25 MG tablet Take 25 mg by mouth daily.    Marland Kitchen levothyroxine (SYNTHROID, LEVOTHROID) 75 MCG tablet TAKE (1) TABLET BY MOUTH ONCE DAILY BEFORE BREAKFAST. 30 tablet 5  . metoprolol succinate (TOPROL-XL) 50 MG 24 hr tablet TAKE 1 TABLET BY MOUTH ONCE DAILY. 30 tablet 11  . Multiple Vitamins-Minerals (MULTIVITAMIN PO) Take 1 tablet by mouth daily.    . ondansetron (ZOFRAN ODT) 8 MG disintegrating tablet Take 1 tablet (8 mg total) by mouth every 8 (eight) hours as needed for nausea or vomiting. 20 tablet 0  . ONE TOUCH LANCETS MISC Use to help check blood sugars twice a day Dx E11.9 200 each 0  . polyethylene glycol (MIRALAX / GLYCOLAX) packet Take 17 g by mouth daily as needed for mild constipation.    . potassium chloride SA (K-DUR,KLOR-CON) 20 MEQ tablet TAKE 1 TABLET BY MOUTH ONCE DAILY. 30 tablet 11  . primidone (MYSOLINE) 50 MG tablet TAKE 3 TABLETS (16m) BY MOUTH TWICE DAILY. 180 tablet 5  .  QUEtiapine (SEROQUEL) 50 MG tablet Take 50 mg by mouth at bedtime.    Marland Kitchen Respiratory Therapy Supplies (FLUTTER) DEVI Use as directed 1 each 0  . sodium chloride (OCEAN) 0.65 % SOLN nasal spray 1-2 sprays each nostril up to 4 times daily as needed 30 mL prn  . sucralfate (CARAFATE) 1 G tablet Take 1 g by mouth 4 (four) times daily.    Marland Kitchen tiZANidine (ZANAFLEX) 4 MG tablet Take 4 mg by mouth at bedtime.     . triamterene-hydrochlorothiazide (MAXZIDE-25) 37.5-25 MG tablet TAKE 1 TABLET BY MOUTH ONCE DAILY. 30 tablet 11  . VIIBRYD 40 MG TABS Take 40 mg by mouth daily.    . insulin detemir (LEVEMIR) 100 UNIT/ML injection Inject 40 Units into the skin at bedtime.    .  Insulin Pen Needle (NOVOFINE) 30G X 8 MM MISC Use needle to inject insulin into the skin as directed. 200 each 2  . Insulin Glargine (TOUJEO SOLOSTAR) 300 UNIT/ML SOPN Inject 100 Units into the skin daily. (Patient not taking: Reported on 02/13/2015) 1.5 mL 11  . pantoprazole (PROTONIX) 20 MG tablet Take 1 tablet (20 mg total) by mouth 2 (two) times daily. Before meals 60 tablet 11  . ranitidine (ZANTAC) 150 MG tablet TAKE 1 TABLET BY MOUTH TWICE DAILY. (Patient not taking: Reported on 02/13/2015) 60 tablet 11   No facility-administered medications prior to visit.    ROS Review of Systems  Constitutional: Negative.   HENT: Negative.   Eyes: Negative.   Respiratory: Negative.  Negative for cough, choking, chest tightness, shortness of breath and stridor.   Cardiovascular: Negative.  Negative for chest pain, palpitations and leg swelling.  Gastrointestinal: Negative.  Negative for nausea, vomiting, abdominal pain, diarrhea and constipation.  Endocrine: Negative.  Negative for polyphagia and polyuria.  Genitourinary: Negative.   Musculoskeletal: Positive for arthralgias. Negative for myalgias and back pain.  Skin: Negative.  Negative for rash.  Allergic/Immunologic: Negative.   Neurological: Negative.  Negative for dizziness.  Hematological: Negative for adenopathy. Does not bruise/bleed easily.  Psychiatric/Behavioral: Negative.     Objective:  BP 144/82 mmHg  Pulse 105  Temp(Src) 98.7 F (37.1 C) (Oral)  Resp 16  Ht _0  (1.626 m)  Wt 175 lb (79.379 kg)  BMI 30.02 kg/m2  SpO2 97%  BP Readings from Last 3 Encounters:  02/13/15 144/82  02/09/15 128/87  02/08/15 130/78    Wt Readings from Last 3 Encounters:  02/13/15 175 lb (79.379 kg)  02/09/15 179 lb (81.194 kg)  02/08/15 178 lb (80.74 kg)    Physical Exam  Constitutional: She is oriented to person, place, and time. No distress.  HENT:  Mouth/Throat: Oropharynx is clear and moist. No oropharyngeal exudate.  Eyes:  Conjunctivae are normal. Right eye exhibits no discharge. Left eye exhibits no discharge. No scleral icterus.  Neck: Normal range of motion. Neck supple. No JVD present. No tracheal deviation present. No thyromegaly present.  Cardiovascular: Normal rate, regular rhythm, normal heart sounds and intact distal pulses.  Exam reveals no gallop and no friction rub.   No murmur heard. Pulmonary/Chest: Effort normal and breath sounds normal. No stridor. No respiratory distress. She has no wheezes. She has no rales. She exhibits no tenderness.  Abdominal: Soft. Bowel sounds are normal. She exhibits no distension and no mass. There is no tenderness. There is no rebound and no guarding.  Musculoskeletal: Normal range of motion. She exhibits no edema or tenderness.       Feet:  Lymphadenopathy:    She has no cervical adenopathy.  Neurological: She is oriented to person, place, and time.  Skin: Skin is warm and dry. No rash noted. She is not diaphoretic. No erythema. No pallor.  Psychiatric: She has a normal mood and affect. Her behavior is normal. Judgment and thought content normal.  Vitals reviewed.   Lab Results  Component Value Date   WBC 8.9 01/22/2015   HGB 13.0 01/22/2015   HCT 37.7 01/22/2015   PLT 186 01/22/2015   GLUCOSE 228* 02/13/2015   CHOL 180 02/13/2015   TRIG 175.0* 02/13/2015   HDL 64.80 02/13/2015   LDLCALC 80 02/13/2015   ALT 36 01/22/2015   AST 35 01/22/2015   NA 132* 02/13/2015   K 4.0 02/13/2015   CL 96 02/13/2015   CREATININE 0.66 02/13/2015   BUN 12 02/13/2015   CO2 25 02/13/2015   TSH 1.18 02/13/2015   INR 0.99 01/04/2015   HGBA1C 7.0* 02/13/2015   MICROALBUR 1.7 02/13/2015    Dg Foot Complete Left  02/09/2015  CLINICAL DATA:  64 year old female reportedly slipped out of bed this morning at 2:45 injuring the toes on her left foot complaining of pain. EXAM: LEFT FOOT - COMPLETE 3+ VIEW COMPARISON:  No priors. FINDINGS: Three views of the left foot demonstrate  acute nondisplaced fractures through the proximal aspect of the second, third and fourth proximal phalanges. These do not appear to extend to the articular surface. Overlying soft tissues appear mildly swollen. No other acute displaced fractures are noted. Mild degenerative changes of osteoarthritis are noted, most pronounced midfoot. Small plantar calcaneal spur incidentally noted. IMPRESSION: 1. Nondisplaced fractures through the proximal aspects of the second, third and fourth proximal phalanges. Electronically Signed   By: Vinnie Langton M.D.   On: 02/09/2015 09:58    Assessment & Plan:   Nargis was seen today for hypertension, hyperlipidemia and diabetes.  Diagnoses and all orders for this visit:  Essential hypertension- her blood pressure is adequately well controlled, electrolytes and renal function are stable. -     Basic metabolic panel; Future -     Urinalysis, Routine w reflex microscopic (not at [facility]); Future  Other specified hypothyroidism- her TSH is in the normal range, she will stay on the current dose of Synthroid.  Type 2 diabetes mellitus with complication, with long-term current use of insulin ([place])- her blood sugars are adequately well controlled, her renal function is stable. -     Microalbumin / creatinine urine ratio; Future -     Hemoglobin A1c; Future -     Insulin Detemir (LEVEMIR FLEXPEN) 100 UNIT/ML Pen; Inject 50 Units into the skin daily at 10 pm. -     Insulin Pen Needle (NOVOFINE) 30G X 8 MM MISC; Use needle to inject insulin into the skin as directed.  Hyperlipidemia with target LDL less than 100- she has achieved her LDL goal and is doing well on the statin. -     Lipid panel; Future -     TSH; Future  Other orders -     neomycin-polymyxin-pramoxine (NEOSPORIN PLUS) 1 % cream; Apply topically 3 (three) times daily. -     loperamide (IMODIUM) 2 MG capsule; Take 1 capsule (2 mg total) by mouth as needed for diarrhea or loose stools. -     Homeopathic  Products (ARNICARE) GEL; Apply 1 Act topically daily. -     Nutritional Supplements (BOOST DIABETIC) LIQD; Take 1 Act by mouth daily.  I have discontinued  [name]'s ranitidine, Insulin Glargine, and insulin detemir. I am also having her start on neomycin-polymyxin-pramoxine, loperamide, ARNICARE, BOOST DIABETIC, and Insulin Detemir. Additionally, I am having her maintain her Multiple Vitamins-Minerals (MULTIVITAMIN PO), tiZANidine, ondansetron, albuterol, albuterol, HYDROcodone-acetaminophen, fesoterodine, gabapentin, ASPIRIN LOW DOSE, metoprolol succinate, potassium chloride SA, primidone, triamterene-hydrochlorothiazide, levothyroxine, polyethylene glycol, glucose blood, glucose blood, FLUTTER, ONE TOUCH ULTRA 2, glucose blood, ONE TOUCH LANCETS, Insulin Lispro, buPROPion, lamoTRIgine, QUEtiapine, Fluticasone Furoate-Vilanterol, fexofenadine, sodium chloride, VIIBRYD, sucralfate, pantoprazole, JANUVIA, and Insulin Pen Needle.  Meds ordered this encounter  Medications  . pantoprazole (PROTONIX) 40 MG tablet    Sig:   . JANUVIA 100 MG tablet    Sig:   . neomycin-polymyxin-pramoxine (NEOSPORIN PLUS) 1 % cream    Sig: Apply topically 3 (three) times daily.    Dispense:  14.2 g    Refill:  11  . loperamide (IMODIUM) 2 MG capsule    Sig: Take 1 capsule (2 mg total) by mouth as needed for diarrhea or loose stools.    Dispense:  30 capsule    Refill:  11  . Homeopathic Products (ARNICARE) GEL    Sig: Apply 1 Act topically daily.    Dispense:  75 g    Refill:  11  . Nutritional Supplements (BOOST DIABETIC) LIQD    Sig: Take 1 Act by mouth daily.    Dispense:  237 mL    Refill:  11  . Insulin Detemir (LEVEMIR FLEXPEN) 100 UNIT/ML Pen    Sig: Inject 50 Units into the skin daily at 10 pm.    Dispense:  15 mL    Refill:  11  . Insulin Pen Needle (NOVOFINE) 30G X 8 MM MISC    Sig: Use needle to inject insulin into the skin as directed.    Dispense:  200 each    Refill:  2     Follow-up:  Return in about 4 months (around 06/13/2015).  Scarlette Calico, MD

## 2015-02-13 NOTE — Patient Instructions (Signed)

## 2015-02-14 ENCOUNTER — Encounter: Payer: Self-pay | Admitting: Internal Medicine

## 2015-02-14 ENCOUNTER — Other Ambulatory Visit: Payer: Self-pay | Admitting: Internal Medicine

## 2015-02-14 NOTE — Telephone Encounter (Signed)
Please advise. LEt me know if you are okay with these

## 2015-02-14 NOTE — Telephone Encounter (Signed)
yes

## 2015-02-14 NOTE — Telephone Encounter (Signed)
Patient called back in to advise that she needs a script for no rinse shampoo, because her toes can not get wet.

## 2015-02-14 NOTE — Telephone Encounter (Signed)
Pt states she needs handwritten scripts for Monostat and Biofreeze.  She states Dr. Joellyn Haff has written her Biofreeze before and she wants to know if she should call him for that

## 2015-02-16 MED ORDER — MICONAZOLE NITRATE 1200 & 2 MG & % VA KIT: 1.0000 | PACK | Freq: Once | VAGINAL | Status: DC

## 2015-02-16 NOTE — Telephone Encounter (Signed)
ok 

## 2015-02-18 ENCOUNTER — Telehealth: Payer: Self-pay | Admitting: Internal Medicine

## 2015-02-18 NOTE — Telephone Encounter (Signed)
Spoke with pt. States that she needs a new hospital bed. The order needs to be sent to [facility] with the diagnosis code on it.  CY - are you okay with making this order? Thanks.

## 2015-02-18 NOTE — Telephone Encounter (Signed)
I have completred the form for her hospital bed and given to [facility] to fax

## 2015-02-19 ENCOUNTER — Telehealth: Payer: Self-pay | Admitting: Internal Medicine

## 2015-02-19 NOTE — Telephone Encounter (Signed)
Forms were faxed back yesterday- I had placed them in the to be faxed stack at front.

## 2015-02-19 NOTE — Telephone Encounter (Signed)
ATC pt line busy x 3 [facility] HST is already ordered in [place].

## 2015-02-19 NOTE — Telephone Encounter (Signed)
Katie, please advise. Thanks.  

## 2015-02-20 ENCOUNTER — Emergency Department (HOSPITAL_COMMUNITY)
Admission: EM | Admit: 2015-02-20 | Discharge: 2015-02-20 | Disposition: A | Discharge status: Home / Self Care | Payer: Commercial Managed Care - HMO | Attending: Emergency Medicine | Admitting: Emergency Medicine

## 2015-02-20 ENCOUNTER — Telehealth: Payer: Self-pay | Admitting: Internal Medicine

## 2015-02-20 ENCOUNTER — Encounter (HOSPITAL_COMMUNITY): Payer: Self-pay | Admitting: Emergency Medicine

## 2015-02-20 DIAGNOSIS — K219 Gastro-esophageal reflux disease without esophagitis: Secondary | ICD-10-CM | POA: Insufficient documentation

## 2015-02-20 DIAGNOSIS — Z8679 Personal history of other diseases of the circulatory system: Secondary | ICD-10-CM | POA: Diagnosis not present

## 2015-02-20 DIAGNOSIS — E1165 Type 2 diabetes mellitus with hyperglycemia: Secondary | ICD-10-CM | POA: Insufficient documentation

## 2015-02-20 DIAGNOSIS — Z79899 Other long term (current) drug therapy: Secondary | ICD-10-CM | POA: Insufficient documentation

## 2015-02-20 DIAGNOSIS — Z91040 Latex allergy status: Secondary | ICD-10-CM | POA: Diagnosis not present

## 2015-02-20 DIAGNOSIS — N390 Urinary tract infection, site not specified: Secondary | ICD-10-CM | POA: Diagnosis not present

## 2015-02-20 DIAGNOSIS — G40909 Epilepsy, unspecified, not intractable, without status epilepticus: Secondary | ICD-10-CM | POA: Diagnosis not present

## 2015-02-20 DIAGNOSIS — J45909 Unspecified asthma, uncomplicated: Secondary | ICD-10-CM | POA: Insufficient documentation

## 2015-02-20 DIAGNOSIS — F419 Anxiety disorder, unspecified: Secondary | ICD-10-CM | POA: Insufficient documentation

## 2015-02-20 DIAGNOSIS — Z7951 Long term (current) use of inhaled steroids: Secondary | ICD-10-CM | POA: Diagnosis not present

## 2015-02-20 DIAGNOSIS — Z794 Long term (current) use of insulin: Secondary | ICD-10-CM | POA: Diagnosis not present

## 2015-02-20 DIAGNOSIS — G8929 Other chronic pain: Secondary | ICD-10-CM | POA: Insufficient documentation

## 2015-02-20 DIAGNOSIS — F329 Major depressive disorder, single episode, unspecified: Secondary | ICD-10-CM | POA: Diagnosis not present

## 2015-02-20 DIAGNOSIS — Z7982 Long term (current) use of aspirin: Secondary | ICD-10-CM | POA: Insufficient documentation

## 2015-02-20 DIAGNOSIS — E039 Hypothyroidism, unspecified: Secondary | ICD-10-CM | POA: Insufficient documentation

## 2015-02-20 DIAGNOSIS — K5900 Constipation, unspecified: Secondary | ICD-10-CM | POA: Diagnosis not present

## 2015-02-20 DIAGNOSIS — R739 Hyperglycemia, unspecified: Secondary | ICD-10-CM

## 2015-02-20 DIAGNOSIS — Z9049 Acquired absence of other specified parts of digestive tract: Secondary | ICD-10-CM | POA: Diagnosis not present

## 2015-02-20 DIAGNOSIS — Z87891 Personal history of nicotine dependence: Secondary | ICD-10-CM | POA: Diagnosis not present

## 2015-02-20 LAB — URINALYSIS, ROUTINE W REFLEX MICROSCOPIC
Bilirubin Urine: NEGATIVE
Glucose, UA: NEGATIVE mg/dL
Hgb urine dipstick: NEGATIVE
Ketones, ur: NEGATIVE mg/dL
Nitrite: POSITIVE — AB
Protein, ur: NEGATIVE mg/dL
Specific Gravity, Urine: 1.01 (ref 1.005–1.030)
pH: 6 (ref 5.0–8.0)

## 2015-02-20 LAB — URINE MICROSCOPIC-ADD ON: RBC / HPF: NONE SEEN RBC/hpf (ref 0–5)

## 2015-02-20 LAB — I-STAT CHEM 8, ED
BUN: 17 mg/dL (ref 6–20)
Calcium, Ion: 1.16 mmol/L (ref 1.13–1.30)
Chloride: 100 mmol/L — ABNORMAL LOW (ref 101–111)
Creatinine, Ser: 0.8 mg/dL (ref 0.44–1.00)
Glucose, Bld: 211 mg/dL — ABNORMAL HIGH (ref 65–99)
HCT: 35 % — ABNORMAL LOW (ref 36.0–46.0)
Hemoglobin: 11.9 g/dL — ABNORMAL LOW (ref 12.0–15.0)
Potassium: 3.4 mmol/L — ABNORMAL LOW (ref 3.5–5.1)
Sodium: 138 mmol/L (ref 135–145)
TCO2: 23 mmol/L (ref 0–100)

## 2015-02-20 LAB — CBG MONITORING, ED: Glucose-Capillary: 214 mg/dL — ABNORMAL HIGH (ref 65–99)

## 2015-02-20 MED ORDER — CEPHALEXIN 500 MG PO CAPS: 500.0000 mg | ORAL_CAPSULE | Freq: Four times a day (QID) | ORAL | Status: DC

## 2015-02-20 NOTE — Telephone Encounter (Signed)
[place] [facility]  Patient Name: [name]  DOB: [birth year]    Initial Comment Blood sugar 310, very thirsty   Nurse Assessment  Nurse: Harlow Mares, RN, Suanne Marker Date/Time Eilene Ghazi Time): 02/20/2015 5:39:21 PM  Confirm and document reason for call. If symptomatic, describe symptoms. ---Blood sugar 310, very thirsty. Reports that her BS has been running around 200 lately. Reports that she takes 40U Lavamir at hs and 10U Humalog tid before meals.  Has the patient traveled out of the country within the last 30 days? ---No  Does the patient have any new or worsening symptoms? ---Yes  Will a triage be completed? ---Yes  Related visit to physician within the last 2 weeks? ---Yes  Does the PT have any chronic conditions? (i.e. diabetes, asthma, etc.) ---Yes  List chronic conditions. ---diabetes; asthma; GERD;  Is this a behavioral health call? ---No     Guidelines    Guideline Title Affirmed Question Affirmed Notes  Diabetes - High Blood Sugar [1] Blood glucose > 300 mg/dl (16.5 mmol/l) AND [2] two or more times in a row    Final Disposition User   Call PCP Now Harlow Mares, RN, Rhonda    Comments  Directive advises to have patient go to [facility]/ED if blood sugar over 300 x2. Caller reports her blood sugar was 297 earlier today and is now 310. Caller reports she will go to ED.   Referrals  REFERRED TO PCP OFFICE  GO TO FACILITY OTHER - SPECIFY   Disagree/Comply: Comply

## 2015-02-20 NOTE — ED Provider Notes (Signed)
CSN: [id number]     Arrival date & time 02/20/15  1936 History   First MD Initiated Contact with Patient 02/20/15 1939     Chief Complaint  Patient presents with  . Hyperglycemia      HPI Pt was seen at [place]. Per EMS and pt report, c/o gradual onset and persistence of multiple intermittent episodes of "high blood sugars" at home for the past 1 to 2 weeks. Pt states today at lunch her CBG was "297" and after she went to a funeral this afternoon it was "310." Pt states her PMD told her to come to the ED for evaluation "if my sugars were over 300." Denies any other complaints. Denies CP/SOB, no cough, no abd pain, no N/V/D, no back pain, no fevers, no rash.    Past Medical History  Diagnosis Date  . 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 [name], residual palpitations  . Vocal cord dysfunction   . Tremor, essential     takes Mysoline and Neurontin daily.  . Allergic rhinitis     uses Nasonex daily as needed  . Low sodium syndrome   . Chronic back pain     reason unknown  . Lumbar radiculopathy   . DDD (degenerative disc disease), lumbar   . Esophageal reflux     takes Zantac daily  . Hypertension     takes Losartan daily  . Anxiety     takes Ativan daily as needed  . Hypothyroidism     takes Synthroid daily  . Depression     takes Zoloft daily as well as Cymbalta  . Diabetes mellitus     takes NOvolin daily  . Asthma     Albuterol inhaler and Neb daily as needed  . History of bronchitis Dec 2014  . Seizures ([place]) 04-05-13    one time ran out of Primidone and had stopped it cold [place]  . Weakness     in left arm  . Joint pain   . Joint swelling     left ankle  . Dysphagia   . Constipation   . Allergy to angiotensin receptor blockers (ARB) 01/22/2014    Angioedema  . 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 Rayann Heman 2007   Past Surgical History  Procedure Laterality Date  . Tonsillectomy    . Right breast cyst       benign  . Left ankle ligament repair      x 2  . Left elbow repair    . Partial hysterectomy    . Cholecystectomy    . Cesarean section    . Ablation for avnrt    . Colonoscopy  01/16/2004    GMW:NUUVOZ rectum/colon  . Esophagogastroduodenoscopy (egd) with esophageal dilation  04/03/2002    DGU:[id number]'V ring, otherwise normal esophagus, status post dilation with 54 F/Normal stomach  . Bartholin gland cyst excision      x 2  . Esophagogastroduodenoscopy (egd) with esophageal dilation N/A 11/08/2012    ZDG:[id number]'I ring s/p dilation/HH  . Subthalamic stimulator insertion Bilateral 12/04/2013    Procedure: Bilateral Deep brain stimulator placement;  Surgeon: [name], MD;  Location: [facility];  Service: Neurosurgery;  Laterality: Bilateral;  Bilateral Deep brain stimulator placement  . Pulse generator implant Bilateral 12/15/2013    Procedure: BILATERAL PULSE GENERATOR IMPLANT;  Surgeon: [name], MD;  Location: [place] [facility];  Service: Neurosurgery;  Laterality: Bilateral;  BILATERAL  . Maximum access (mas)posterior lumbar interbody fusion (plif) 1 level N/A 04/24/2014    Procedure: Lumbar four-five Maximum access Surgery posterior lumbar interbody fusion;  Surgeon: [name], MD;  Location: [place] [facility];  Service: Neurosurgery;  Laterality: N/A;  Lumbar four-five Maximum access Surgery posterior lumbar interbody fusion   Family History  Problem Relation Age of Onset  . Lung cancer Father     DIED AGE 62 LUNG CA  . Alcohol abuse Father   . Anxiety disorder Father   . Depression Father   . COPD Mother     DIED AGE 103,MRSA,COPD,PNEUMONIA  . Pneumonia Mother     DIED AGE 103,MRSA,COPD,PNEUMONIA  . Supraventricular tachycardia Mother   . Anxiety disorder Mother   . Depression Mother   . Alcohol abuse Mother   . COPD Brother     AGE 60  . Heart disease Sister     DIED AGE 54, SMOKER,?HEART  . Colon cancer Neg Hx    Social History  Substance Use Topics  . Smoking  status: Former Smoker -- 0.30 packs/day for 10 years    Types: Cigarettes    Quit date: 04/23/1993  . Smokeless tobacco: Never Used     Comment: quit smoking 25+yrs ago  . Alcohol Use: No     Comment: no alcohol in 70yr   OB History    Gravida Para Term Preterm AB TAB SAB Ectopic Multiple Living   _0 Review of Systems ROS: Statement: All systems negative except as marked or noted in the HPI; Constitutional: Negative for fever and chills. ; ; Eyes: Negative for eye pain, redness and discharge. ; ; ENMT: Negative for ear pain, hoarseness, nasal congestion, sinus pressure and sore throat. ; ; Cardiovascular: Negative for chest pain, palpitations, diaphoresis, dyspnea and peripheral edema. ; ; Respiratory: Negative for cough, wheezing and stridor. ; ; Gastrointestinal: Negative for nausea, vomiting, diarrhea, abdominal pain, blood in stool, hematemesis, jaundice and rectal bleeding. . ; ; Genitourinary: Negative for dysuria, flank pain and hematuria. ; ; Musculoskeletal: Negative for back pain and neck pain. Negative for swelling and trauma.; ; Skin: Negative for pruritus, rash, abrasions, blisters, bruising and skin lesion.; ; Neuro: Negative for headache, lightheadedness and neck stiffness. Negative for weakness, altered level of consciousness , altered mental status, extremity weakness, paresthesias, involuntary movement, seizure and syncope.      Allergies  Invokana; Losartan; Metformin and related; Latex; Morphine and related; Other; and Oxycodone-acetaminophen  Home Medications   Prior to Admission medications   Medication Sig Start Date End Date Taking? Authorizing Provider  ASPIRIN LOW DOSE 81 MG EC tablet TAKE 1 TABLET BY MOUTH ONCE DAILY. 01/12/15  Yes TJanith Lima MD  buPROPion (WELLBUTRIN XL) 150 MG 24 hr tablet Take 150 mg by mouth daily.   Yes Historical Provider, MD  fesoterodine (TOVIAZ) 8 MG TB24 tablet Take 1 tablet (8 mg total) by mouth daily. 12/31/14  Yes  TJanith Lima MD  fexofenadine (ALLEGRA) 60 MG tablet Take 1 tablet (60 mg total) by mouth 2 (two) times daily. 02/08/15  Yes CDeneise Lever MD  Fluticasone Furoate-Vilanterol (BREO ELLIPTA) 100-25 MCG/INH AEPB Inhale 1 puff then rinse mouth, once daily 02/08/15  Yes Clinton D Young, MD  gabapentin (NEURONTIN) 300 MG capsule TAKE 1 CAPSULE BY MOUTH EVERY MORNING AND 2 CAPSULES AT BEDTIME. 01/12/15  Yes TJanith Lima MD  Homeopathic Products (ARNICARE) GEL  Apply 1 Act topically daily. 02/13/15  Yes Janith Lima, MD  Insulin Detemir (LEVEMIR FLEXPEN) 100 UNIT/ML Pen Inject 50 Units into the skin daily at 10 pm. Patient taking differently: Inject 40 Units into the skin daily at 10 pm.  02/13/15  Yes Janith Lima, MD  Insulin Lispro (HUMALOG KWIKPEN) 200 UNIT/ML SOPN Inject 10 Units into the skin 3 (three) times daily with meals. 02/07/15  Yes Janith Lima, MD  lamoTRIgine (LAMICTAL) 25 MG tablet Take 25 mg by mouth daily.   Yes Historical Provider, MD  levothyroxine (SYNTHROID, LEVOTHROID) 75 MCG tablet TAKE (1) TABLET BY MOUTH ONCE DAILY BEFORE BREAKFAST. 01/12/15  Yes Janith Lima, MD  metoprolol succinate (TOPROL-XL) 50 MG 24 hr tablet TAKE 1 TABLET BY MOUTH ONCE DAILY. 01/12/15  Yes Janith Lima, MD  Multiple Vitamins-Minerals (MULTIVITAMIN PO) Take 1 tablet by mouth daily.   Yes Historical Provider, MD  neomycin-polymyxin-pramoxine (NEOSPORIN PLUS) 1 % cream Apply topically 3 (three) times daily. 02/13/15  Yes Janith Lima, MD  Nutritional Supplements (BOOST DIABETIC) LIQD Take 1 Act by mouth daily. 02/13/15  Yes Janith Lima, MD  pantoprazole (PROTONIX) 40 MG tablet Take 40 mg by mouth daily.  02/08/15  Yes Historical Provider, MD  potassium chloride SA (K-DUR,KLOR-CON) 20 MEQ tablet TAKE 1 TABLET BY MOUTH ONCE DAILY. 01/12/15  Yes Janith Lima, MD  primidone (MYSOLINE) 50 MG tablet TAKE 3 TABLETS (182m) BY MOUTH TWICE DAILY. 01/12/15  Yes TJanith Lima MD  QUEtiapine (SEROQUEL) 50 MG  tablet Take 50 mg by mouth at bedtime.   Yes Historical Provider, MD  ranitidine (ZANTAC) 150 MG tablet Take 150 mg by mouth 2 (two) times daily. 02/08/15  Yes Historical Provider, MD  sucralfate (CARAFATE) 1 G tablet Take 1 g by mouth 4 (four) times daily. 02/01/15  Yes Historical Provider, MD  tiZANidine (ZANAFLEX) 4 MG tablet Take 4 mg by mouth at bedtime.    Yes Historical Provider, MD  triamterene-hydrochlorothiazide (MAXZIDE-25) 37.5-25 MG tablet TAKE 1 TABLET BY MOUTH ONCE DAILY. 01/12/15  Yes TJanith Lima MD  albuterol (PROVENTIL HFA;VENTOLIN HFA) 108 (90 BASE) MCG/ACT inhaler Inhale 2 puffs into the lungs every 6 (six) hours as needed for wheezing or shortness of breath. 11/26/14   SEzequiel Essex MD  albuterol (PROVENTIL) (2.5 MG/3ML) 0.083% nebulizer solution Take 3 mLs (2.5 mg total) by nebulization every 6 (six) hours as needed for wheezing or shortness of breath. 11/28/14   CDeneise Lever MD  Blood Glucose Monitoring Suppl (ONE TOUCH ULTRA 2) W/DEVICE KIT Use to check blood sugars daily Dx e11.9 01/21/15   TJanith Lima MD  glucose blood (ONE TOUCH TEST STRIPS) test strip 1 each by Other route 2 (two) times daily. Use to check blood sugars twice a day Dx E11.9 01/21/15   TJanith Lima MD  glucose blood test strip Use TID  E11.9 01/18/15   SBinnie Rail MD  glucose blood test strip Use TID E11.9 01/18/15   SBinnie Rail MD  HYDROcodone-acetaminophen (NORCO) 10-325 MG per tablet Take 1 tablet by mouth every 6 (six) hours as needed for moderate pain. 12/25/14   TJanith Lima MD  Insulin Pen Needle (NOVOFINE) 30G X 8 MM MISC Use needle to inject insulin into the skin as directed. 02/13/15   TJanith Lima MD  loperamide (IMODIUM) 2 MG capsule Take 1 capsule (2 mg total) by mouth as needed for diarrhea or loose stools. 02/13/15  Janith Lima, MD  miconazole (MONISTAT 1 COMBINATION PACK) kit Place 1 each vaginally once. Patient not taking: Reported on 02/20/2015 02/16/15    Janith Lima, MD  ondansetron (ZOFRAN ODT) 8 MG disintegrating tablet Take 1 tablet (8 mg total) by mouth every 8 (eight) hours as needed for nausea or vomiting. 11/24/14   Evalee Jefferson, PA-C  ONE TOUCH LANCETS MISC Use to help check blood sugars twice a day Dx E11.9 01/21/15   Janith Lima, MD  polyethylene glycol (MIRALAX / Floria Raveling) packet Take 17 g by mouth daily as needed for mild constipation.    Historical Provider, MD  Respiratory Therapy Supplies (FLUTTER) DEVI Use as directed 01/18/15   Tanda Rockers, MD  sodium chloride (OCEAN) 0.65 % SOLN nasal spray 1-2 sprays each nostril up to 4 times daily as needed 02/08/15   Deneise Lever, MD   BP 127/71 mmHg  Pulse 104  Temp(Src) 98.3 F (36.8 C) (Oral)  Resp 20  Ht 5' 4.5" (1.638 m)  Wt 172 lb (78.019 kg)  BMI 29.08 kg/m2  SpO2 99% Physical Exam  1945: Physical examination:  Nursing notes reviewed; Vital signs and O2 SAT reviewed;  Constitutional: Well developed, Well nourished, Well hydrated, In no acute distress; Head:  Normocephalic, atraumatic; Eyes: EOMI, PERRL, No scleral icterus; ENMT: Mouth and pharynx normal, Mucous membranes moist; Neck: Supple, Full range of motion, No lymphadenopathy; Cardiovascular: Regular rate and rhythm, No gallop; Respiratory: Breath sounds clear & equal bilaterally, No wheezes.  Speaking full sentences with ease, Normal respiratory effort/excursion; Chest: Nontender, Movement normal; Abdomen: Soft, Nontender, Nondistended, Normal bowel sounds; Genitourinary: No CVA tenderness; Extremities: Pulses normal, No tenderness, No edema, No calf edema or asymmetry.; Neuro: AA&Ox3, Major CN grossly intact. No facial droop. Speech clear. No gross focal motor in extremities.; Skin: Color normal, Warm, Dry.   ED Course  Procedures (including critical care time) Labs Review   Imaging Review  I have personally reviewed and evaluated these images and lab results as part of my medical decision-making.   EKG  Interpretation None      MDM  MDM Reviewed: previous chart, nursing note and vitals Reviewed previous: labs Interpretation: labs     Results for orders placed or performed during the hospital encounter of 02/20/15  Urinalysis, Routine w reflex microscopic  Result Value Ref Range   Color, Urine YELLOW YELLOW   APPearance CLEAR CLEAR   Specific Gravity, Urine 1.010 1.005 - 1.030   pH 6.0 5.0 - 8.0   Glucose, UA NEGATIVE NEGATIVE mg/dL   Hgb urine dipstick NEGATIVE NEGATIVE   Bilirubin Urine NEGATIVE NEGATIVE   Ketones, ur NEGATIVE NEGATIVE mg/dL   Protein, ur NEGATIVE NEGATIVE mg/dL   Nitrite POSITIVE (A) NEGATIVE   Leukocytes, UA SMALL (A) NEGATIVE  Urine microscopic-add on  Result Value Ref Range   Squamous Epithelial / LPF 0-5 (A) NONE SEEN   WBC, UA 6-30 0 - 5 WBC/hpf   RBC / HPF NONE SEEN 0 - 5 RBC/hpf   Bacteria, UA MANY (A) NONE SEEN  CBG monitoring, ED  Result Value Ref Range   Glucose-Capillary 214 (H) 65 - 99 mg/dL  I-stat Chem 8, ED  Result Value Ref Range   Sodium 138 135 - 145 mmol/L   Potassium 3.4 (L) 3.5 - 5.1 mmol/L   Chloride 100 (L) 101 - 111 mmol/L   BUN 17 6 - 20 mg/dL   Creatinine, Ser 0.80 0.44 - 1.00 mg/dL   Glucose, Bld  211 (H) 65 - 99 mg/dL   Calcium, Ion 1.16 1.13 - 1.30 mmol/L   TCO2 23 0 - 100 mmol/L   Hemoglobin 11.9 (L) 12.0 - 15.0 g/dL   HCT 35.0 (L) 36.0 - 46.0 %    2105:  Pt now does c/o urinary frequency. +UTI, UC pending; tx keflex. Workup reassuring. No hyperglycemia while in the ED, not acidotic. Pt states she would like to go home now. Dx and testing d/w pt.  Questions answered.  Verb understanding, agreeable to d/c home with outpt f/u.    Francine Graven, DO 02/24/15 Sharilyn Sites

## 2015-02-20 NOTE — ED Notes (Signed)
Pt states understanding of care given and follow up instructions.  Ambulated from ED  

## 2015-02-20 NOTE — ED Notes (Signed)
Patient assisted to bedside commode, tolerated well.

## 2015-02-20 NOTE — Discharge Instructions (Signed)
°Emergency Department Resource Guide °1) Find a Doctor and Pay Out of Pocket °Although you won't have to find out who is covered by your insurance plan, it is a good idea to ask around and get recommendations. You will then need to call the office and see if the doctor you have chosen will accept you as a new patient and what types of options they offer for patients who are self-pay. Some doctors offer discounts or will set up payment plans for their patients who do not have insurance, but you will need to ask so you aren't surprised when you get to your appointment. ° °2) Contact Your Local Health Department °Not all health departments have doctors that can see patients for sick visits, but many do, so it is worth a call to see if yours does. If you don't know where your local health department is, you can check in your phone book. The CDC also has a tool to help you locate your state's health department, and many state websites also have listings of all of their local health departments. ° °3) Find a Walk-in Clinic °If your illness is not likely to be very severe or complicated, you may want to try a walk in clinic. These are popping up all over the country in pharmacies, drugstores, and shopping centers. They're usually staffed by nurse practitioners or physician assistants that have been trained to treat common illnesses and complaints. They're usually fairly quick and inexpensive. However, if you have serious medical issues or chronic medical problems, these are probably not your best option. ° °No Primary Care Doctor: °- Call [facility] at  832-8000 - they can help you locate a primary care doctor that  accepts your insurance, provides certain services, etc. °- Physician Referral Service- 1-800-533-3463 ° °Chronic Pain Problems: °Organization         Address  Phone   Notes  ° [facility]  (336) 297-2271 Patients need to be referred by their primary care doctor.  ° °Medication  Assistance: °Organization         Address  Phone   Notes  °[place] [facility] [address] °[place], [place] [address] (336) 641-8030 --Must be a resident of [place] °-- Must have NO insurance coverage whatsoever (no Medicaid/ Medicare, etc.) °-- The pt. MUST have a primary care doctor that directs their care regularly and follows them in the community °  °[facility]  (866) 331-1348   °[employer]  (888) 892-1162   ° °Agencies that provide inexpensive medical care: °Organization         Address  Phone   Notes  °[facility]  (336) 832-8035   °[facility]    (336) 832-7272   °[facility] [address] °[place], [place] [address] (336) 832-4777   °[facility] [address], °[place] (336) 271-4999   °[facility]    (336) 373-0678   °[facility]    (336) 272-1050   °[facility] ° [address], [place] Phone:  (336) 832-4444, Fax:  (336) 832-4440 Hours of Operation:  9 am - 6 pm, M-F.  Also accepts Medicaid/Medicare and self-pay.  °[facility] ° [address], [place] Phone: (336) [phone], Fax: (336) [phone]. Hours of Operation:  8:30 am - 5:30 pm, M-F.  Also accepts Medicaid and self-pay.  °[facility] [place] [address], [place] Phone: (336) 878-6027   °[facility] [address], [place], [place] (336)723-1848, Ext. 123 Mondays & Thursdays: 7-9 AM.  First 15 patients are seen on a first come, first serve basis. °  ° °Medicaid-accepting [place] Providers: ° °Organization         Address  Phone   Notes  °[facility] [address], [place] (336) 641-2100 Also accepts self-pay patients.  °[facility] [address], [place] ° (336) 856-9996   °[facility] [address], [place]  (336) 288-8857   °[facility] [address] [place] [address], [place] (336) 299-7000   °Veita Bland [address], [place]  ° (336) 373-1557 Only accepts [place] Access Medicaid patients after they have their name applied to their card.  ° °Self-Pay (no insurance) in [place]: ° °Organization         Address  Phone   Notes  °Sickle Cell Patients, [facility] [address], [place] (336) 832-1970   °[facility] [address], [place] (336) 832-4400   °[facility] ° [address], [place] (336) 992-4800   °[facility]/Dr. Osei-Bonsu ° [address] [place] [address], [place] or [address], [place] (336) 841-8500 Phone number for both [place] and [place] locations is the same.  °[facility] [address], [place] (336) 299-0000   °[facility] [address] [place] [address], [place] or [address] (336) 852-7530 °(336) 878-2260   °[facility] [address], [place] (336) 350-1642, phone; (336) 294-5005, fax Sees patients 1st and 3rd Saturday of every month.  Must not qualify for public or private insurance (i.e. Medicaid, Medicare, [place] Health Choice, Veterans' Benefits) • Household income should be no more than 200% of the poverty level •The clinic cannot treat you if you are pregnant or think you are pregnant • Sexually transmitted diseases are not treated at the clinic.  ° ° °Dental Care: °Organization         Address  Phone  Notes  °[place] [facility] [address], [place] (336) 641-6152 Accepts children up to age 21 who are enrolled in Medicaid or [place] Health Choice; pregnant women with a Medicaid card; and children who have applied for Medicaid or [place] Health Choice, but were declined, whose parents can pay a reduced fee at time of service.  °[place]  [facility] [place]  [address], [place] (336) 641-7733 Accepts children up to age 21 who are enrolled in Medicaid or [place] Health Choice; pregnant women with a Medicaid card; and children who have applied for Medicaid or [place] Health Choice, but were declined, whose parents can pay a reduced fee at time of service.  °[facility] ° [address], [place] (336) 641-4533 Patients are seen by appointment only. Walk-ins are not accepted. [facility] will see patients 18 years of age and older. °Monday - Tuesday (8am-5pm) °Most Wednesdays (8:30-5pm) °$30 per visit, cash only  °[facility] ° [address], [place] (336) 641-4533 Patients are seen by appointment only. Walk-ins are not accepted. [facility] will see patients 18 years of age and older. °One   Wednesday Evening (Monthly: Volunteer Based).  $30 per visit, cash only  °[facility]  (919) 537-3737 for adults; Children under age 4, call Graduate Pediatric Dentistry at (919) [phone]. Children aged 4-14, please call (919) [phone] to request a pediatric application. ° Dental services are provided in all areas of dental care including fillings, crowns and bridges, complete and partial dentures, implants, gum treatment, root canals, and extractions. Preventive care is also provided. Treatment is provided to both adults and children. °Patients are selected via a lottery and there is often a waiting list. °  °[facility] [address], °[place] ° (336) 763-8833 [email or web address].drcivils.[email or web address] °  °[facility] [address], [place], [place] (336)723-1848, Ext. 123 Second and Fourth Thursday of each month, opens at 6:30 AM; Clinic ends at 9 AM.  Patients are seen on a first-come first-served basis, and a limited number are seen during each clinic.  ° °[facility] ° [address], [place], [place] (336) 723-7904    Eligibility Requirements °You must have lived in [place], [place], or [place] counties for at least the last three months. °  You cannot be eligible for state or federal sponsored healthcare insurance, including Veterans Administration, Medicaid, or Medicare. °  You generally cannot be eligible for healthcare insurance through your employer.  °  How to apply: °Eligibility screenings are held every Tuesday and Wednesday afternoon from 1:00 pm until 4:00 pm. You do not need an appointment for the interview!  °[facility] [address], [place], [place] 336-631-2330   °[facility]  336-342-8273   °[place]  336-703-3100   °[facility]  336-570-6415   ° °Behavioral Health Resources in the Community: °Intensive Outpatient Programs °Organization         Address  Phone  Notes  °[facility] [address], [place], [place] 336-878-6098   °[facility] [address], [place], [place] 336-832-9800   °[facility] [address], [place], [place] ° 336-882-2125   °[facility] [address],  °[place], [place] 1-800-853-5163 or 336-641-4981   °Substance Abuse Resources °Organization         Address  Phone  Notes  °[facility]  336-882-2125   °[facility]  336-784-9470   °[facility]  336-285-9073   °[facility]  336-845-3988   °Residential & Outpatient Substance Abuse Program  1-800-659-3381   °Psychological Services °Organization         Address  Phone  Notes  °[facility]  336- 832-9600   °[facility]  336- 378-7881   °[facility] [address], [place] 1-800-853-5163 or 336-641-4981   ° °Mobile Crisis Teams °Organization         Address  Phone  Notes  °[facility], Mobile Crisis Care Unit  1-877-626-1772   °Assertive °[facility] ° [address]  [place], [place] 336-834-9664   °Sharon DeEsch [address] °[place] 336-554-5454   ° °Self-Help/Support Groups °Organization         Address  Phone             Notes  °[facility] of  - variety of support groups  336- 373-1402 Call for more information  °Narcotics Anonymous (NA), [facility] [address], °[place]   2 meetings at this location  ° °  Residential Treatment Programs Organization         Address  Phone  Notes  [facility] [address],    [place]  1-539-865-6749   [facility]  [address], [place] [id number], [place], [place]   [place], [place] [phone] Admissions: 8am-3pm M-F  [facility] [place] [address],    [place], [place] [id number]   [facility] [address] [name], [place], [place]   [facility] [address]  [place], [place]   [facility] Intensive Outpatient [address], [place], [place]   [facility] ([place]  [place], [place] 1-845-782-4547 or 540-761-7723   [facility] [address] [place], [place] Accepts Medicaid  [facility] [address]  [place] 1-(385) 684-8401 Substance Abuse/Addiction Treatment   [facility] Organization         Address  Phone  Notes  [facility]  236-220-0901   Domenic Schwab, PhD [address] [place], [place]   940-054-7427 or 825-822-7232   [place], [place] (681) 379-7295   [facility] [address] [place], [place] 984-225-8925 Insurance/Medicaid/sponsorship through [facility] and Families [address]                                    [place], [place] [phone] [place] [address], [place] [phone]    Dr. Adele Schilder  [phone]   [facility] Dept. 1) [address] [place] 2) [place] 3)  [address], [place] [phone]  [phone]   [place] (365)435-6615 or [phone] (After Hours)      Take the prescription as directed.  Keep a diary of your blood sugars at home and show this to your regular medical doctor at your follow up appointment. Call your regular medical doctor tomorrow to schedule a follow up appointment within the next 2 days.  Return to the Emergency Department immediately sooner if worsening.

## 2015-02-20 NOTE — ED Notes (Signed)
Pt c/o having blood sugars from mid 200s to 300 for about 1.5 weeks.

## 2015-02-21 ENCOUNTER — Other Ambulatory Visit: Payer: Self-pay

## 2015-02-21 ENCOUNTER — Ambulatory Visit (INDEPENDENT_AMBULATORY_CARE_PROVIDER_SITE_OTHER): Payer: Commercial Managed Care - HMO | Admitting: Gastroenterology

## 2015-02-21 ENCOUNTER — Encounter: Payer: Self-pay | Admitting: Gastroenterology

## 2015-02-21 VITALS — BP 134/85 | HR 93 | Temp 98.0°F | Ht 64.5 in | Wt 177.2 lb

## 2015-02-21 DIAGNOSIS — K219 Gastro-esophageal reflux disease without esophagitis: Secondary | ICD-10-CM | POA: Diagnosis not present

## 2015-02-21 DIAGNOSIS — Z1211 Encounter for screening for malignant neoplasm of colon: Secondary | ICD-10-CM | POA: Insufficient documentation

## 2015-02-21 MED ORDER — PEG 3350-KCL-NA BICARB-NACL 420 G PO SOLR: 4000.0000 mL | Freq: Once | ORAL | Status: DC

## 2015-02-21 NOTE — Telephone Encounter (Signed)
Patient is scheduled to come March 07, 2015 to pick up HST machine from me and is aware of appt.

## 2015-02-21 NOTE — Progress Notes (Signed)
  Referring Provider: Jones, Thomas L, MD Primary Care Physician:  Thomas Jones, MD  Primary GI: Dr. [name]   Chief Complaint  Patient presents with  . Gastroesophageal Reflux    HPI:   [name] is a 64 y.o. female presenting today with a history of GERD and constipation, presenting with reflux concerns. Actually due for routine screening colonoscopy now. Last EGD in 2014 with [name]'s ring s/p dilation.    Intermittent constipation but takes Miralax, which works for her. If eats oatmeal, she doesn't need Miralax. Stays at [facility] in [place]. In interim from last visit, has had deep brain stimulation for essential tremors, which has been successful. However, it affected her balance.   Reflux not controlled well. Was prescribed Protonix for "next cycle of drugs". On Prilosec and Zantac. Trying to watch spicy foods. Feels like esophagus has spasms a few times but not recently. Has noted some vague esophageal dysphagia recently. States last week she was hurting by her "xiphoid process". Vague epigastric discomfort. No pain with eating unless eating the greens at the home with vinegar. No N/V.   States she has a cluster of hemorrhoids. No rectal bleeding.   Past Medical History  Diagnosis Date  . 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 James Allred, residual palpitations  . Vocal cord dysfunction   . Tremor, essential     takes Mysoline and Neurontin daily.  . Allergic rhinitis     uses Nasonex daily as needed  . Low sodium syndrome   . Chronic back pain     reason unknown  . Lumbar radiculopathy   . DDD (degenerative disc disease), lumbar   . Esophageal reflux     takes Zantac daily  . Hypertension     takes Losartan daily  . Anxiety     takes Ativan daily as needed  . Hypothyroidism     takes Synthroid daily  . Depression     takes Zoloft daily as well as Cymbalta  . Diabetes mellitus     takes NOvolin daily  . Asthma     Albuterol inhaler and Neb daily as needed  . History of bronchitis Dec 2014  . Seizures ([name]) 04-05-13    one time ran out of Primidone and had stopped it cold turkey  . Weakness     in left arm  . Joint pain   . Joint swelling     left ankle  . Dysphagia   . Constipation   . Allergy to angiotensin receptor blockers (ARB) 01/22/2014    Angioedema  . 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 Allred 2007    Past Surgical History  Procedure Laterality Date  . Tonsillectomy    . Right breast cyst      benign  . Left ankle ligament repair      x 2  . Left elbow repair    . Partial hysterectomy    . Cholecystectomy    . Cesarean section    . Ablation for avnrt    . Colonoscopy  01/16/2004    RMR:Normal rectum/colon  . Esophagogastroduodenoscopy (egd) with esophageal dilation  04/03/2002    RMR:Schatski's ring, otherwise normal esophagus, status post dilation with 54 F/Normal stomach  . Bartholin gland cyst excision      x 2  . Esophagogastroduodenoscopy (egd) with esophageal dilation N/A 11/08/2012    Dr. [name]:schatzki's ring s/p dilation/hiatal hernia  .   Subthalamic stimulator insertion Bilateral 12/04/2013    Procedure: Bilateral Deep brain stimulator placement;  Surgeon: Joseph Stern, MD;  Location: [facility];  Service: Neurosurgery;  Laterality: Bilateral;  Bilateral Deep brain stimulator placement  . Pulse generator implant Bilateral 12/15/2013    Procedure: BILATERAL PULSE GENERATOR IMPLANT;  Surgeon: Joseph Stern, MD;  Location: [facility];  Service: Neurosurgery;  Laterality: Bilateral;  BILATERAL  . Maximum access (mas)posterior lumbar interbody fusion (plif) 1 level N/A 04/24/2014    Procedure: Lumbar four-five Maximum access Surgery posterior lumbar interbody fusion;  Surgeon: Joseph Stern, MD;  Location: [facility];  Service: Neurosurgery;  Laterality: N/A;  Lumbar four-five Maximum access Surgery posterior lumbar interbody fusion    Current Outpatient  Prescriptions  Medication Sig Dispense Refill  . albuterol (PROVENTIL HFA;VENTOLIN HFA) 108 (90 BASE) MCG/ACT inhaler Inhale 2 puffs into the lungs every 6 (six) hours as needed for wheezing or shortness of breath. 1 Inhaler 2  . albuterol (PROVENTIL) (2.5 MG/3ML) 0.083% nebulizer solution Take 3 mLs (2.5 mg total) by nebulization every 6 (six) hours as needed for wheezing or shortness of breath. 75 mL 12  . ASPIRIN LOW DOSE 81 MG EC tablet TAKE 1 TABLET BY MOUTH ONCE DAILY. 30 tablet 11  . Blood Glucose Monitoring Suppl (ONE TOUCH ULTRA 2) W/DEVICE KIT Use to check blood sugars daily Dx e11.9 1 each 0  . buPROPion (WELLBUTRIN XL) 150 MG 24 hr tablet Take 150 mg by mouth daily.    . cephALEXin (KEFLEX) 500 MG capsule Take 1 capsule (500 mg total) by mouth 4 (four) times daily. 40 capsule 0  . fesoterodine (TOVIAZ) 8 MG TB24 tablet Take 1 tablet (8 mg total) by mouth daily. 30 tablet 11  . fexofenadine (ALLEGRA) 60 MG tablet Take 1 tablet (60 mg total) by mouth 2 (two) times daily. 60 tablet 11  . Fluticasone Furoate-Vilanterol (BREO ELLIPTA) 100-25 MCG/INH AEPB Inhale 1 puff then rinse mouth, once daily 60 each 11  . gabapentin (NEURONTIN) 300 MG capsule TAKE 1 CAPSULE BY MOUTH EVERY MORNING AND 2 CAPSULES AT BEDTIME. 90 capsule 11  . glucose blood (ONE TOUCH TEST STRIPS) test strip 1 each by Other route 2 (two) times daily. Use to check blood sugars twice a day Dx E11.9 100 each 3  . glucose blood test strip Use TID  E11.9 100 each 12  . glucose blood test strip Use TID E11.9 100 each 12  . Homeopathic Products (ARNICARE) GEL Apply 1 Act topically daily. 75 g 11  . HYDROcodone-acetaminophen (NORCO) 10-325 MG per tablet Take 1 tablet by mouth every 6 (six) hours as needed for moderate pain. 75 tablet 0  . Insulin Detemir (LEVEMIR FLEXPEN) 100 UNIT/ML Pen Inject 50 Units into the skin daily at 10 pm. (Patient taking differently: Inject 40 Units into the skin daily at 10 pm. ) 15 mL 11  . Insulin  Lispro (HUMALOG KWIKPEN) 200 UNIT/ML SOPN Inject 10 Units into the skin 3 (three) times daily with meals. 9 mL 3  . Insulin Pen Needle (NOVOFINE) 30G X 8 MM MISC Use needle to inject insulin into the skin as directed. 200 each 2  . lamoTRIgine (LAMICTAL) 25 MG tablet Take 25 mg by mouth daily.    . levothyroxine (SYNTHROID, LEVOTHROID) 75 MCG tablet TAKE (1) TABLET BY MOUTH ONCE DAILY BEFORE BREAKFAST. 30 tablet 5  . loperamide (IMODIUM) 2 MG capsule Take 1 capsule (2 mg total) by mouth as needed for diarrhea or loose   stools. 30 capsule 11  . metoprolol succinate (TOPROL-XL) 50 MG 24 hr tablet TAKE 1 TABLET BY MOUTH ONCE DAILY. 30 tablet 11  . miconazole (MONISTAT 1 COMBINATION PACK) kit Place 1 each vaginally once. 1 each 11  . Multiple Vitamins-Minerals (MULTIVITAMIN PO) Take 1 tablet by mouth daily.    . neomycin-polymyxin-pramoxine (NEOSPORIN PLUS) 1 % cream Apply topically 3 (three) times daily. 14.2 g 11  . Nutritional Supplements (BOOST DIABETIC) LIQD Take 1 Act by mouth daily. 237 mL 11  . ondansetron (ZOFRAN ODT) 8 MG disintegrating tablet Take 1 tablet (8 mg total) by mouth every 8 (eight) hours as needed for nausea or vomiting. 20 tablet 0  . ONE TOUCH LANCETS MISC Use to help check blood sugars twice a day Dx E11.9 200 each 0  . pantoprazole (PROTONIX) 40 MG tablet Take 40 mg by mouth daily.     . polyethylene glycol (MIRALAX / GLYCOLAX) packet Take 17 g by mouth daily as needed for mild constipation.    . potassium chloride SA (K-DUR,KLOR-CON) 20 MEQ tablet TAKE 1 TABLET BY MOUTH ONCE DAILY. 30 tablet 11  . primidone (MYSOLINE) 50 MG tablet TAKE 3 TABLETS (150mg) BY MOUTH TWICE DAILY. 180 tablet 5  . QUEtiapine (SEROQUEL) 50 MG tablet Take 50 mg by mouth at bedtime.    . ranitidine (ZANTAC) 150 MG tablet Take 150 mg by mouth 2 (two) times daily.    . Respiratory Therapy Supplies (FLUTTER) DEVI Use as directed 1 each 0  . sodium chloride (OCEAN) 0.65 % SOLN nasal spray 1-2 sprays each  nostril up to 4 times daily as needed 30 mL prn  . sucralfate (CARAFATE) 1 G tablet Take 1 g by mouth 4 (four) times daily.    . tiZANidine (ZANAFLEX) 4 MG tablet Take 4 mg by mouth at bedtime.     . triamterene-hydrochlorothiazide (MAXZIDE-25) 37.5-25 MG tablet TAKE 1 TABLET BY MOUTH ONCE DAILY. 30 tablet 11   No current facility-administered medications for this visit.    Allergies as of 02/21/2015 - Review Complete 02/20/2015  Allergen Reaction Noted  . Invokana [canagliflozin] Other (See Comments) 12/05/2014  . Losartan  01/22/2014  . Metformin and related Diarrhea 01/18/2013  . Latex Other (See Comments) 07/02/2014  . Morphine and related Other (See Comments) 04/11/2013  . Other Other (See Comments) 07/02/2014  . Oxycodone-acetaminophen Itching 05/11/2011    Family History  Problem Relation Age of Onset  . Lung cancer Father     DIED AGE 58 LUNG CA  . Alcohol abuse Father   . Anxiety disorder Father   . Depression Father   . COPD Mother     DIED AGE 88,MRSA,COPD,PNEUMONIA  . Pneumonia Mother     DIED AGE 88,MRSA,COPD,PNEUMONIA  . Supraventricular tachycardia Mother   . Anxiety disorder Mother   . Depression Mother   . Alcohol abuse Mother   . COPD Brother     AGE 60  . Heart disease Sister     DIED AGE 62, SMOKER,?HEART  . Colon cancer Neg Hx     Social History   Social History  . Marital Status: Married    Spouse Name: N/A  . Number of Children: N/A  . Years of Education: N/A   Occupational History  . retired    Social History Main Topics  . Smoking status: Former Smoker -- 0.30 packs/day for 10 years    Types: Cigarettes    Quit date: 04/23/1993  . Smokeless tobacco: Never Used       Comment: quit smoking 25+yrs ago  . Alcohol Use: No     Comment: no alcohol in 30yrs  . Drug Use: No  . Sexual Activity: No   Other Topics Concern  . None   Social History Narrative   Married, 1 daughte, living.  Lives with husband in [place], [place].   1 child died brain  tumor at age 7.   Two grandchildren.   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   Retired 2011.   No tobacco.   Alcohol: none in 30 yrs.  Distant history of heavy use.   No drug use.    Review of Systems: Gen: see HPI  CV: Denies chest pain, palpitations, syncope, peripheral edema, and claudication. Resp: Denies dyspnea at rest, cough, wheezing, coughing up blood, and pleurisy. GI: see HPI  Derm: Denies rash, itching, dry skin Psych: Denies depression, anxiety, memory loss, confusion. No homicidal or suicidal ideation.  Heme: Denies bruising, bleeding, and enlarged lymph nodes.  Physical Exam: BP 134/85 mmHg  Pulse 93  Temp(Src) 98 F (36.7 C) (Oral)  Ht 5' 4.5" (1.638 m)  Wt 177 lb 3.2 oz (80.377 kg)  BMI 29.96 kg/m2 General:   Alert and oriented. No distress noted. Pleasant and cooperative.  Head:  Normocephalic and atraumatic. Eyes:  Conjuctiva clear without scleral icterus. Mouth:  Oral mucosa pink and moist. Good dentition. No lesions. Heart:  S1, S2 present without murmurs, rubs, or gallops. Regular rate and rhythm. Abdomen:  +BS, soft, non-tender and non-distended. No rebound or guarding. No HSM or masses noted. Msk:  Symmetrical without gross deformities. Normal posture. Extremities:  Left lower extremity in boot  Neurologic:  Alert and  oriented x4;  grossly normal neurologically. Psych:  Alert and cooperative. Normal mood and affect.  

## 2015-02-21 NOTE — Patient Instructions (Signed)
We have scheduled you for a colonoscopy and upper endoscopy with Dr. Gala Romney.  Continue to eat oatmeal each morning, if you can :)   I will reach out to Dr. Annamaria Boots to let him know that you are having this procedure.

## 2015-02-21 NOTE — Telephone Encounter (Signed)
Called and spoke to pt. Pt stated she has already been scheduled for sleep study, per pt's chart it does not state pt has been scheduled yet. Pt stated she spoke with [facility] regarding the sleep study.  Dawn, please advise. Thanks.

## 2015-02-22 ENCOUNTER — Other Ambulatory Visit: Payer: Self-pay

## 2015-02-22 MED ORDER — QUETIAPINE FUMARATE 50 MG PO TABS: 50.0000 mg | ORAL_TABLET | Freq: Every day | ORAL | Status: DC

## 2015-02-22 MED ORDER — BUPROPION HCL ER (XL) 150 MG PO TB24: 150.0000 mg | ORAL_TABLET | Freq: Every day | ORAL | Status: DC

## 2015-02-22 MED ORDER — LAMOTRIGINE 25 MG PO TABS: 25.0000 mg | ORAL_TABLET | Freq: Every day | ORAL | Status: DC

## 2015-02-22 MED ORDER — SUCRALFATE 1 G PO TABS: 1.0000 g | ORAL_TABLET | Freq: Four times a day (QID) | ORAL | Status: DC

## 2015-02-22 NOTE — Patient Instructions (Signed)
[name]  02/22/2015     @PREFPERIOPPHARMACY @   Your procedure is scheduled on 03/04/2015  Report to [facility] at 7:45 A.M.  Call this number if you have problems the morning of surgery:  [phone]   Remember:  Do not eat food or drink liquids after midnight.  Take these medicines the morning of surgery with A SIP OF WATER See attached list   Do not wear jewelry, make-up or nail polish.  Do not wear lotions, powders, or perfumes.  You may wear deodorant.  Do not shave 48 hours prior to surgery.  Men may shave face and neck.  Do not bring valuables to the hospital.  [facility] is not responsible for any belongings or valuables.  Contacts, dentures or bridgework may not be worn into surgery.  Leave your suitcase in the car.  After surgery it may be brought to your room.  For patients admitted to the hospital, discharge time will be determined by your treatment team.  Patients discharged the day of surgery will not be allowed to drive home.    Please read over the following fact sheets that you were given. Anesthesia Post-op Instructions     PATIENT INSTRUCTIONS POST-ANESTHESIA  IMMEDIATELY FOLLOWING SURGERY:  Do not drive or operate machinery for the first twenty four hours after surgery.  Do not make any important decisions for twenty four hours after surgery or while taking narcotic pain medications or sedatives.  If you develop intractable nausea and vomiting or a severe headache please notify your doctor immediately.  FOLLOW-UP:  Please make an appointment with your surgeon as instructed. You do not need to follow up with anesthesia unless specifically instructed to do so.  WOUND CARE INSTRUCTIONS (if applicable):  Keep a dry clean dressing on the anesthesia/puncture wound site if there is drainage.  Once the wound has quit draining you may leave it open to air.  Generally you should leave the bandage intact for twenty four hours unless there is drainage.  If  the epidural site drains for more than 36-48 hours please call the anesthesia department.  QUESTIONS?:  Please feel free to call your physician or the hospital operator if you have any questions, and they will be happy to assist you.      Esophagogastroduodenoscopy Esophagogastroduodenoscopy (EGD) is a procedure that is used to examine the lining of the esophagus, stomach, and first part of the small intestine (duodenum). A long, flexible, lighted tube with a camera attached (endoscope) is inserted down the throat to view these organs. This procedure is done to detect problems or abnormalities, such as inflammation, bleeding, ulcers, or growths, in order to treat them. The procedure lasts 5-20 minutes. It is usually an outpatient procedure, but it may need to be performed in a hospital in emergency cases. LET [facility] CARE PROVIDER KNOW ABOUT:  Any allergies you have.  All medicines you are taking, including vitamins, herbs, eye drops, creams, and over-the-counter medicines.  Previous problems you or members of your family have had with the use of anesthetics.  Any blood disorders you have.  Previous surgeries you have had.  Medical conditions you have. RISKS AND COMPLICATIONS Generally, this is a safe procedure. However, problems can occur and include:  Infection.  Bleeding.  Tearing (perforation) of the esophagus, stomach, or duodenum.  Difficulty breathing or not being able to breathe.  Excessive sweating.  Spasms of the larynx.  Slowed heartbeat.  Low blood pressure. BEFORE THE PROCEDURE  Do  not eat or drink anything after midnight on the night before the procedure or as directed by your health care provider.  Do not take your regular medicines before the procedure if your health care provider asks you not to. Ask your health care provider about changing or stopping those medicines.  If you wear dentures, be prepared to remove them before the procedure.  Arrange  for someone to drive you home after the procedure. PROCEDURE  A numbing medicine (local anesthetic) may be sprayed in your throat for comfort and to stop you from gagging or coughing.  You will have an IV tube inserted in a vein in your hand or arm. You will receive medicines and fluids through this tube.  You will be given a medicine to relax you (sedative).  A pain reliever will be given through the IV tube.  A mouth guard may be placed in your mouth to protect your teeth and to keep you from biting on the endoscope.  You will be asked to lie on your left side.  The endoscope will be inserted down your throat and into your esophagus, stomach, and duodenum.  Air will be put through the endoscope to allow your health care provider to clearly view the lining of your esophagus.  The lining of your esophagus, stomach, and duodenum will be examined. During the exam, your health care provider may:  Remove tissue to be examined under a microscope (biopsy) for inflammation, infection, or other medical problems.  Remove growths.  Remove objects (foreign bodies) that are stuck.  Treat any bleeding with medicines or other devices that stop tissues from bleeding (hot cautery, clipping devices).  Widen (dilate) or stretch narrowed areas of your esophagus and stomach.  The endoscope will be withdrawn. AFTER THE PROCEDURE  You will be taken to a recovery area for observation. Your blood pressure, heart rate, breathing rate, and blood oxygen level will be monitored often until the medicines you were given have worn off.  Do not eat or drink anything until the numbing medicine has worn off and your gag reflex has returned. You may choke.  Your health care provider should be able to discuss his or her findings with you. It will take longer to discuss the test results if any biopsies were taken.   This information is not intended to replace advice given to you by your health care provider. Make  sure you discuss any questions you have with your health care provider.   Document Released: 07/24/2004 Document Revised: 04/13/2014 Document Reviewed: 02/24/2012 Elsevier Interactive Patient Education 2016 [employer]. Colonoscopy A colonoscopy is an exam to look at the entire large intestine (colon). This exam can help find problems such as tumors, polyps, inflammation, and areas of bleeding. The exam takes about 1 hour.  LET [facility] CARE PROVIDER KNOW ABOUT:   Any allergies you have.  All medicines you are taking, including vitamins, herbs, eye drops, creams, and over-the-counter medicines.  Previous problems you or members of your family have had with the use of anesthetics.  Any blood disorders you have.  Previous surgeries you have had.  Medical conditions you have. RISKS AND COMPLICATIONS  Generally, this is a safe procedure. However, as with any procedure, complications can occur. Possible complications include:  Bleeding.  Tearing or rupture of the colon wall.  Reaction to medicines given during the exam.  Infection (rare). BEFORE THE PROCEDURE   Ask your health care provider about changing or stopping your regular medicines.  You may be prescribed an oral bowel prep. This involves drinking a large amount of medicated liquid, starting the day before your procedure. The liquid will cause you to have multiple loose stools until your stool is almost clear or light green. This cleans out your colon in preparation for the procedure.  Do not eat or drink anything else once you have started the bowel prep, unless your health care provider tells you it is safe to do so.  Arrange for someone to drive you home after the procedure. PROCEDURE   You will be given medicine to help you relax (sedative).  You will lie on your side with your knees bent.  A long, flexible tube with a light and camera on the end (colonoscope) will be inserted through the rectum and into the  colon. The camera sends video back to a computer screen as it moves through the colon. The colonoscope also releases carbon dioxide gas to inflate the colon. This helps your health care provider see the area better.  During the exam, your health care provider may take a small tissue sample (biopsy) to be examined under a microscope if any abnormalities are found.  The exam is finished when the entire colon has been viewed. AFTER THE PROCEDURE   Do not drive for 24 hours after the exam.  You may have a small amount of blood in your stool.  You may pass moderate amounts of gas and have mild abdominal cramping or bloating. This is caused by the gas used to inflate your colon during the exam.  Ask when your test results will be ready and how you will get your results. Make sure you get your test results.   This information is not intended to replace advice given to you by your health care provider. Make sure you discuss any questions you have with your health care provider.   Document Released: 03/20/2000 Document Revised: 01/11/2013 Document Reviewed: 11/28/2012 Elsevier Interactive Patient Education [employer].

## 2015-02-23 ENCOUNTER — Encounter (HOSPITAL_COMMUNITY): Payer: Self-pay | Admitting: Emergency Medicine

## 2015-02-23 ENCOUNTER — Emergency Department (HOSPITAL_COMMUNITY)
Admission: EM | Admit: 2015-02-23 | Discharge: 2015-02-23 | Disposition: A | Discharge status: Home / Self Care | Payer: Commercial Managed Care - HMO | Attending: Emergency Medicine | Admitting: Emergency Medicine

## 2015-02-23 ENCOUNTER — Emergency Department (HOSPITAL_COMMUNITY): Payer: Commercial Managed Care - HMO

## 2015-02-23 DIAGNOSIS — Z794 Long term (current) use of insulin: Secondary | ICD-10-CM | POA: Insufficient documentation

## 2015-02-23 DIAGNOSIS — J45909 Unspecified asthma, uncomplicated: Secondary | ICD-10-CM | POA: Insufficient documentation

## 2015-02-23 DIAGNOSIS — M545 Low back pain, unspecified: Secondary | ICD-10-CM

## 2015-02-23 DIAGNOSIS — E119 Type 2 diabetes mellitus without complications: Secondary | ICD-10-CM | POA: Insufficient documentation

## 2015-02-23 DIAGNOSIS — Z79899 Other long term (current) drug therapy: Secondary | ICD-10-CM | POA: Insufficient documentation

## 2015-02-23 DIAGNOSIS — G8929 Other chronic pain: Secondary | ICD-10-CM | POA: Insufficient documentation

## 2015-02-23 DIAGNOSIS — Z8739 Personal history of other diseases of the musculoskeletal system and connective tissue: Secondary | ICD-10-CM | POA: Diagnosis not present

## 2015-02-23 DIAGNOSIS — Z87891 Personal history of nicotine dependence: Secondary | ICD-10-CM | POA: Diagnosis not present

## 2015-02-23 DIAGNOSIS — Y998 Other external cause status: Secondary | ICD-10-CM | POA: Insufficient documentation

## 2015-02-23 DIAGNOSIS — Z7951 Long term (current) use of inhaled steroids: Secondary | ICD-10-CM | POA: Insufficient documentation

## 2015-02-23 DIAGNOSIS — K219 Gastro-esophageal reflux disease without esophagitis: Secondary | ICD-10-CM | POA: Insufficient documentation

## 2015-02-23 DIAGNOSIS — W1839XA Other fall on same level, initial encounter: Secondary | ICD-10-CM | POA: Diagnosis not present

## 2015-02-23 DIAGNOSIS — S3992XA Unspecified injury of lower back, initial encounter: Secondary | ICD-10-CM | POA: Diagnosis not present

## 2015-02-23 DIAGNOSIS — F329 Major depressive disorder, single episode, unspecified: Secondary | ICD-10-CM | POA: Diagnosis not present

## 2015-02-23 DIAGNOSIS — Y9289 Other specified places as the place of occurrence of the external cause: Secondary | ICD-10-CM | POA: Diagnosis not present

## 2015-02-23 DIAGNOSIS — Z792 Long term (current) use of antibiotics: Secondary | ICD-10-CM | POA: Diagnosis not present

## 2015-02-23 DIAGNOSIS — G250 Essential tremor: Secondary | ICD-10-CM | POA: Diagnosis not present

## 2015-02-23 DIAGNOSIS — K5900 Constipation, unspecified: Secondary | ICD-10-CM | POA: Diagnosis not present

## 2015-02-23 DIAGNOSIS — Y9389 Activity, other specified: Secondary | ICD-10-CM | POA: Insufficient documentation

## 2015-02-23 DIAGNOSIS — E039 Hypothyroidism, unspecified: Secondary | ICD-10-CM | POA: Diagnosis not present

## 2015-02-23 DIAGNOSIS — F419 Anxiety disorder, unspecified: Secondary | ICD-10-CM | POA: Insufficient documentation

## 2015-02-23 DIAGNOSIS — I10 Essential (primary) hypertension: Secondary | ICD-10-CM | POA: Diagnosis not present

## 2015-02-23 LAB — URINE CULTURE: Culture: >=100000

## 2015-02-23 NOTE — ED Notes (Addendum)
Pt states she fell onto her buttocks from standing position. denies hitting head/loc. EDP at bedside upon arrival. Pt c/o lower back and left knee pain.

## 2015-02-23 NOTE — ED Provider Notes (Signed)
CSN: [id number]     Arrival date & time 02/23/15  1554 History   First MD Initiated Contact with Patient 02/23/15 1554     Chief Complaint  Patient presents with  . Fall     (Consider location/radiation/quality/duration/timing/severity/associated sxs/prior Treatment) HPI  H/o balance issues, fell today while standing up and fell backwards onto sacrum with resulting sacral/coccyx pain. No symptoms elsewhere. Long history of broken bones as well. Did not hit head. No loc. No other associated symptoms.   Past Medical History  Diagnosis Date  . 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 [name], residual palpitations  . Vocal cord dysfunction   . Tremor, essential     takes Mysoline and Neurontin daily.  . Allergic rhinitis     uses Nasonex daily as needed  . Low sodium syndrome   . Chronic back pain     reason unknown  . Lumbar radiculopathy   . DDD (degenerative disc disease), lumbar   . Esophageal reflux     takes Zantac daily  . Hypertension     takes Losartan daily  . Anxiety     takes Ativan daily as needed  . Hypothyroidism     takes Synthroid daily  . Depression     takes Zoloft daily as well as Cymbalta  . Diabetes mellitus     takes NOvolin daily  . Asthma     Albuterol inhaler and Neb daily as needed  . History of bronchitis Dec 2014  . Seizures ([place]) 04-05-13    one time ran out of Primidone and had stopped it cold [place]  . Weakness     in left arm  . Joint pain   . Joint swelling     left ankle  . Dysphagia   . Constipation   . Allergy to angiotensin receptor blockers (ARB) 01/22/2014    Angioedema  . 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 Rayann Heman 2007   Past Surgical History  Procedure Laterality Date  . Tonsillectomy    . Right breast cyst      benign  . Left ankle ligament repair      x 2  . Left elbow repair    . Partial hysterectomy    . Cholecystectomy    . Cesarean section    . Ablation  for avnrt    . Colonoscopy  01/16/2004    QHU:TMLYYT rectum/colon  . Esophagogastroduodenoscopy (egd) with esophageal dilation  04/03/2002    KPT:WSFKCLEX'N ring, otherwise normal esophagus, status post dilation with 54 F/Normal stomach  . Bartholin gland cyst excision      x 2  . Esophagogastroduodenoscopy (egd) with esophageal dilation N/A 11/08/2012    Dr. [name]:schatzki's ring s/p dilation/hiatal hernia  . Subthalamic stimulator insertion Bilateral 12/04/2013    Procedure: Bilateral Deep brain stimulator placement;  Surgeon: [name], MD;  Location: [place] [facility];  Service: Neurosurgery;  Laterality: Bilateral;  Bilateral Deep brain stimulator placement  . Pulse generator implant Bilateral 12/15/2013    Procedure: BILATERAL PULSE GENERATOR IMPLANT;  Surgeon: [name], MD;  Location: [place] [facility]UR[facility] ORS;  Service: Neurosurgery;  Laterality: Bilateral;  BILATERAL  . Maximum access (mas)posterior lumbar interbody fusion (plif) 1 level N/A 04/24/2014    Procedure: Lumbar four-five Maximum access Surgery posterior lumbar interbody fusion;  Surgeon: [name], MD;  Location: [place] [facility] ORS;  Service: Neurosurgery;  Laterality: N/A;  Lumbar four-five Maximum access Surgery posterior  lumbar interbody fusion   Family History  Problem Relation Age of Onset  . Lung cancer Father     DIED AGE 26 LUNG CA  . Alcohol abuse Father   . Anxiety disorder Father   . Depression Father   . COPD Mother     DIED AGE 33,MRSA,COPD,PNEUMONIA  . Pneumonia Mother     DIED AGE 33,MRSA,COPD,PNEUMONIA  . Supraventricular tachycardia Mother   . Anxiety disorder Mother   . Depression Mother   . Alcohol abuse Mother   . COPD Brother     AGE 9  . Heart disease Sister     DIED AGE 71, SMOKER,?HEART  . Colon cancer Neg Hx    Social History  Substance Use Topics  . Smoking status: Former Smoker -- 0.30 packs/day for 10 years    Types: Cigarettes    Quit date: 04/23/1993  . Smokeless tobacco: Never Used      Comment: quit smoking 25+yrs ago  . Alcohol Use: No     Comment: no alcohol in 37yr   OB History    Gravida Para Term Preterm AB TAB SAB Ectopic Multiple Living   _0 Review of Systems  HENT: Negative for sneezing and sore throat.   Eyes: Negative for pain.  Gastrointestinal: Negative for abdominal pain and blood in stool.  Endocrine: Negative for polydipsia and polyuria.  Musculoskeletal: Positive for back pain. Negative for myalgias, joint swelling and neck pain.  Neurological: Negative for light-headedness.  All other systems reviewed and are negative.     Allergies  Invokana; Losartan; Metformin and related; Latex; Morphine and related; Other; and Oxycodone-acetaminophen  Home Medications   Prior to Admission medications   Medication Sig Start Date End Date Taking? Authorizing Provider  ASPIRIN LOW DOSE 81 MG EC tablet TAKE 1 TABLET BY MOUTH ONCE DAILY. 01/12/15  Yes TJanith Lima MD  buPROPion (WELLBUTRIN XL) 150 MG 24 hr tablet Take 1 tablet (150 mg total) by mouth daily. 02/22/15  Yes TJanith Lima MD  cephALEXin (KEFLEX) 500 MG capsule Take 1 capsule (500 mg total) by mouth 4 (four) times daily. 02/20/15  Yes KFrancine Graven DO  fesoterodine (TOVIAZ) 8 MG TB24 tablet Take 1 tablet (8 mg total) by mouth daily. 12/31/14  Yes TJanith Lima MD  fexofenadine (ALLEGRA) 60 MG tablet Take 1 tablet (60 mg total) by mouth 2 (two) times daily. 02/08/15  Yes CDeneise Lever MD  Fluticasone Furoate-Vilanterol (BREO ELLIPTA) 100-25 MCG/INH AEPB Inhale 1 puff then rinse mouth, once daily 02/08/15  Yes Clinton D Young, MD  gabapentin (NEURONTIN) 300 MG capsule TAKE 1 CAPSULE BY MOUTH EVERY MORNING AND 2 CAPSULES AT BEDTIME. 01/12/15  Yes TJanith Lima MD  Homeopathic Products (ARNICARE) GEL Apply 1 Act topically daily. 02/13/15  Yes TJanith Lima MD  HYDROcodone-acetaminophen (NORCO) 10-325 MG per tablet Take 1 tablet by mouth every 6 (six) hours as needed for  moderate pain. 12/25/14  Yes TJanith Lima MD  Insulin Detemir (LEVEMIR FLEXPEN) 100 UNIT/ML Pen Inject 50 Units into the skin daily at 10 pm. 02/13/15  Yes TJanith Lima MD  Insulin Lispro (HUMALOG KWIKPEN) 200 UNIT/ML SOPN Inject 10 Units into the skin 3 (three) times daily with meals. 02/07/15  Yes TJanith Lima MD  lamoTRIgine (LAMICTAL) 25 MG tablet Take 1 tablet (25 mg total) by mouth daily. 02/22/15  Yes TJanith Lima MD  levothyroxine (SYNTHROID, LEVOTHROID) 75 MCG tablet TAKE (1) TABLET BY MOUTH ONCE DAILY BEFORE BREAKFAST. 01/12/15  Yes Janith Lima, MD  metoprolol succinate (TOPROL-XL) 50 MG 24 hr tablet TAKE 1 TABLET BY MOUTH ONCE DAILY. 01/12/15  Yes Janith Lima, MD  Multiple Vitamins-Minerals (MULTIVITAMIN PO) Take 1 tablet by mouth daily.   Yes Historical Provider, MD  neomycin-polymyxin-pramoxine (NEOSPORIN PLUS) 1 % cream Apply topically 3 (three) times daily. 02/13/15  Yes Janith Lima, MD  pantoprazole (PROTONIX) 40 MG tablet Take 40 mg by mouth daily.  02/08/15  Yes Historical Provider, MD  polyethylene glycol (MIRALAX / GLYCOLAX) packet Take 17 g by mouth daily as needed for mild constipation.   Yes Historical Provider, MD  potassium chloride SA (K-DUR,KLOR-CON) 20 MEQ tablet TAKE 1 TABLET BY MOUTH ONCE DAILY. 01/12/15  Yes Janith Lima, MD  primidone (MYSOLINE) 50 MG tablet TAKE 3 TABLETS (132m) BY MOUTH TWICE DAILY. 01/12/15  Yes TJanith Lima MD  QUEtiapine (SEROQUEL) 50 MG tablet Take 1 tablet (50 mg total) by mouth at bedtime. 02/22/15  Yes TJanith Lima MD  ranitidine (ZANTAC) 150 MG tablet Take 150 mg by mouth 2 (two) times daily. 02/08/15  Yes Historical Provider, MD  sodium chloride (OCEAN) 0.65 % SOLN nasal spray 1-2 sprays each nostril up to 4 times daily as needed 02/08/15  Yes Clinton D Young, MD  sucralfate (CARAFATE) 1 G tablet Take 1 tablet (1 g total) by mouth 4 (four) times daily. 02/22/15  Yes TJanith Lima MD  tiZANidine (ZANAFLEX) 4 MG tablet  Take 4 mg by mouth at bedtime.    Yes Historical Provider, MD  triamterene-hydrochlorothiazide (MAXZIDE-25) 37.5-25 MG tablet TAKE 1 TABLET BY MOUTH ONCE DAILY. 01/12/15  Yes TJanith Lima MD  albuterol (PROVENTIL HFA;VENTOLIN HFA) 108 (90 BASE) MCG/ACT inhaler Inhale 2 puffs into the lungs every 6 (six) hours as needed for wheezing or shortness of breath. 11/26/14   SEzequiel Essex MD  albuterol (PROVENTIL) (2.5 MG/3ML) 0.083% nebulizer solution Take 3 mLs (2.5 mg total) by nebulization every 6 (six) hours as needed for wheezing or shortness of breath. 11/28/14   CDeneise Lever MD  Blood Glucose Monitoring Suppl (ONE TOUCH ULTRA 2) W/DEVICE KIT Use to check blood sugars daily Dx e11.9 01/21/15   TJanith Lima MD  glucose blood (ONE TOUCH TEST STRIPS) test strip 1 each by Other route 2 (two) times daily. Use to check blood sugars twice a day Dx E11.9 01/21/15   TJanith Lima MD  glucose blood test strip Use TID  E11.9 01/18/15   SBinnie Rail MD  glucose blood test strip Use TID E11.9 01/18/15   SBinnie Rail MD  Insulin Pen Needle (NOVOFINE) 30G X 8 MM MISC Use needle to inject insulin into the skin as directed. 02/13/15   TJanith Lima MD  loperamide (IMODIUM) 2 MG capsule Take 1 capsule (2 mg total) by mouth as needed for diarrhea or loose stools. 02/13/15   TJanith Lima MD  miconazole (MONISTAT 1 COMBINATION PACK) kit Place 1 each vaginally once. 02/16/15   TJanith Lima MD  Nutritional Supplements (BOOST DIABETIC) LIQD Take 1 Act by mouth daily. 02/13/15   TJanith Lima MD  ondansetron (ZOFRAN ODT) 8 MG disintegrating tablet Take 1 tablet (8 mg total) by mouth every 8 (eight) hours as needed for nausea or vomiting. 11/24/14   JEvalee Jefferson PA-C  ONE TOUCH LANCETS MISC Use to help check  blood sugars twice a day Dx E11.9 01/21/15   Janith Lima, MD  polyethylene glycol-electrolytes (NULYTELY/GOLYTELY) 420 G solution Take 4,000 mLs by mouth once. 02/21/15   Orvil Feil, NP   Respiratory Therapy Supplies (FLUTTER) DEVI Use as directed 01/18/15   Tanda Rockers, MD   BP 148/88 mmHg  Pulse 83  Temp(Src) 98.3 F (36.8 C)  Resp 16  Ht _0  (1.626 m)  Wt 172 lb (78.019 kg)  BMI 29.51 kg/m2  SpO2 100% Physical Exam  Constitutional: She is oriented to person, place, and time. She appears well-developed and well-nourished.  HENT:  Head: Normocephalic and atraumatic.  Neck: Normal range of motion.  Cardiovascular: Normal rate and regular rhythm.   Pulmonary/Chest: No stridor. No respiratory distress.  Abdominal: She exhibits no distension.  Musculoskeletal: Normal range of motion. She exhibits tenderness (around lower sacrum).  Neurological: She is alert and oriented to person, place, and time. No cranial nerve deficit. Coordination normal.  Normal lwoer extremity strength and sensation. Boot to left ankle prior to arrival. Aside from that has normal gait, ability to stand and transfer.   Nursing note and vitals reviewed.   ED Course  Procedures (including critical care time) Labs Review Labs Reviewed - No data to display  Imaging Review Dg Lumbar Spine Complete  02/23/2015  CLINICAL DATA:  Initial encounter for Fall, lost her balance and fell today, most pain at tailbone EXAM: [place] 4+ VIEW COMPARISON:  10/02/2014 FINDINGS: Five lumbar type vertebral bodies. L4-5 trans pedicle screw fixation. Cholecystectomy. Sacroiliac joints are symmetric. No acute hardware complication. Maintenance of vertebral body height and alignment. Degenerative disc disease including at L5-S1 and L3-4. IMPRESSION: Postsurgical changes and spondylosis.  No acute osseous abnormality. Electronically Signed   By: Abigail Miyamoto M.D.   On: 02/23/2015 17:02   Dg Pelvis 1-2 Views  02/23/2015  CLINICAL DATA:  Initial encounter for Fall, lost her balance and fell today, most pain at tailbone EXAM: PELVIS - 1-2 VIEW COMPARISON:  04/05/2014 CT FINDINGS: L4-5 fixation. Femoral  heads are located. Sacroiliac joints are symmetric. No acute fracture. IMPRESSION: No acute osseous abnormality. Electronically Signed   By: Abigail Miyamoto M.D.   On: 02/23/2015 16:58   Dg Sacrum/coccyx  02/23/2015  CLINICAL DATA:  Initial encounter for Fall, lost her balance and fell today, most pain at tailbone EXAM: SACRUM AND COCCYX - 2+ VIEW COMPARISON:  Pelvic films of same date. FINDINGS: Sacroiliac joints are symmetric. L4-5 fixation. Normal appearance of the coccyx on the lateral view. L5-S1 degenerative disc disease. IMPRESSION: No acute osseous abnormality. Electronically Signed   By: Abigail Miyamoto M.D.   On: 02/23/2015 16:59   I have personally reviewed and evaluated these images and lab results as part of my medical decision-making.   EKG Interpretation None      MDM   Final diagnoses:  Midline low back pain without sciatica   Fall with resulting sacral pain. Will eval with xr's xr's negative. Will dc on NSAIDs for pain control.       Merrily Pew, MD 02/23/15 [phone]

## 2015-02-24 ENCOUNTER — Telehealth: Payer: Self-pay | Admitting: Internal Medicine

## 2015-02-24 NOTE — Telephone Encounter (Signed)
Patient call me evening of November 18. Upcoming colonoscopy soon. She requested her fleets enema preparation be sent electronically to [facility] ASAP so she would have it by the time of her colonoscopy prep period.  Please get this prescription called in or send in expeditiously first of this week so she'll be squared away for her upcoming colonoscopy.

## 2015-02-25 ENCOUNTER — Telehealth: Payer: Self-pay | Admitting: Internal Medicine

## 2015-02-25 ENCOUNTER — Encounter (HOSPITAL_COMMUNITY): Payer: Self-pay

## 2015-02-25 ENCOUNTER — Encounter (HOSPITAL_COMMUNITY)
Admission: RE | Admit: 2015-02-25 | Discharge: 2015-02-25 | Disposition: A | Discharge status: Home / Self Care | Payer: Commercial Managed Care - HMO | Source: Ambulatory Visit | Attending: Internal Medicine | Admitting: Internal Medicine

## 2015-02-25 ENCOUNTER — Telehealth (HOSPITAL_COMMUNITY): Payer: Self-pay

## 2015-02-25 DIAGNOSIS — Z01812 Encounter for preprocedural laboratory examination: Secondary | ICD-10-CM | POA: Insufficient documentation

## 2015-02-25 LAB — CBC
HCT: 43.2 % (ref 36.0–46.0)
Hemoglobin: 13.2 g/dL (ref 12.0–15.0)
MCH: 31.9 pg (ref 26.0–34.0)
MCHC: 30.6 g/dL (ref 30.0–36.0)
MCV: 104.3 fL — ABNORMAL HIGH (ref 78.0–100.0)
Platelets: 168 10*3/uL (ref 150–400)
RBC: 4.14 MIL/uL (ref 3.87–5.11)
RDW: 13.6 % (ref 11.5–15.5)
WBC: 11.8 10*3/uL — ABNORMAL HIGH (ref 4.0–10.5)

## 2015-02-25 LAB — BASIC METABOLIC PANEL
Anion gap: 10 (ref 5–15)
BUN: 15 mg/dL (ref 6–20)
CO2: 26 mmol/L (ref 22–32)
Calcium: 9.1 mg/dL (ref 8.9–10.3)
Chloride: 94 mmol/L — ABNORMAL LOW (ref 101–111)
Creatinine, Ser: 0.7 mg/dL (ref 0.44–1.00)
GFR calc Af Amer: >60 mL/min (ref >60)
GFR calc non Af Amer: >60 mL/min (ref >60)
Glucose, Bld: 201 mg/dL — ABNORMAL HIGH (ref 65–99)
Potassium: 4.2 mmol/L (ref 3.5–5.1)
Sodium: 130 mmol/L — ABNORMAL LOW (ref 135–145)

## 2015-02-25 MED ORDER — BISACODYL 10 MG RE SUPP: 10.0000 mg | RECTAL | Status: DC | PRN

## 2015-02-25 NOTE — Telephone Encounter (Signed)
I spoke with [name] at the pharmacy, she said the pts prep was delivered to her on 02/21/15. She is having constipation issues this morning and is going to use the fleets enema from her prep and get another enema to replace it. She has a pre-op appt today

## 2015-02-25 NOTE — Telephone Encounter (Signed)
Post ED Visit - Positive Culture Follow-up  Culture report reviewed by antimicrobial stewardship pharmacist:  []  Elenor Quinones, Pharm.D. []  Heide Guile, Pharm.D., BCPS []  Parks Neptune, Pharm.D. []  Alycia Rossetti, Pharm.D., BCPS []  [place], [place].D., BCPS, AAHIVP []  Legrand Como, Pharm.D., BCPS, AAHIVP [x]  Cassie Nicole Kindred, Pharm.D. []  Stephens November, Pharm.D.  Positive urine culture, >/=100,000 colonies -> E Coli Treated with Cephalexin, organism sensitive to the same and no further patient follow-up is required at this time.  Dortha Kern 02/25/2015, 3:13 AM

## 2015-02-25 NOTE — Telephone Encounter (Signed)
done

## 2015-02-25 NOTE — Telephone Encounter (Signed)
Pt isn't has been trying to pass a bowel movement and is in need for a suppository ASAP [facility]acy [facility] in [place].

## 2015-02-25 NOTE — Telephone Encounter (Signed)
Patient has an appointment this afternoon at the hospital and has been unable to pass a bowel movement for about an hour. Patient was wanting a suppository called in ASAP if possible. Please advise.[name]

## 2015-02-27 ENCOUNTER — Telehealth: Payer: Self-pay | Admitting: Internal Medicine

## 2015-02-27 ENCOUNTER — Telehealth: Payer: Self-pay | Admitting: Emergency Medicine

## 2015-02-27 ENCOUNTER — Encounter: Payer: Self-pay | Admitting: Internal Medicine

## 2015-02-27 ENCOUNTER — Ambulatory Visit (INDEPENDENT_AMBULATORY_CARE_PROVIDER_SITE_OTHER): Payer: Commercial Managed Care - HMO | Admitting: Internal Medicine

## 2015-02-27 VITALS — BP 130/88 | HR 93 | Temp 98.1°F | Resp 16 | Ht 64.0 in | Wt 174.0 lb

## 2015-02-27 DIAGNOSIS — E118 Type 2 diabetes mellitus with unspecified complications: Secondary | ICD-10-CM | POA: Diagnosis not present

## 2015-02-27 DIAGNOSIS — I10 Essential (primary) hypertension: Secondary | ICD-10-CM | POA: Diagnosis not present

## 2015-02-27 DIAGNOSIS — E038 Other specified hypothyroidism: Secondary | ICD-10-CM | POA: Diagnosis not present

## 2015-02-27 DIAGNOSIS — Z794 Long term (current) use of insulin: Secondary | ICD-10-CM

## 2015-02-27 LAB — HM COLONOSCOPY

## 2015-02-27 MED ORDER — DRY SHAMPOO EX AERO: 1.0000 | INHALATION_SPRAY | CUTANEOUS | Status: DC | PRN

## 2015-02-27 MED ORDER — PANTOPRAZOLE SODIUM 40 MG PO TBEC: 40.0000 mg | DELAYED_RELEASE_TABLET | Freq: Every day | ORAL | Status: DC

## 2015-02-27 MED ORDER — EQ MAXI OVERNIGHT EXTRA HEAVY PADS: MEDICATED_PAD | Status: DC

## 2015-02-27 MED ORDER — BOOST DIABETIC PO LIQD: 1.0000 | Freq: Every day | ORAL | Status: DC

## 2015-02-27 NOTE — Telephone Encounter (Signed)
Done

## 2015-02-27 NOTE — Progress Notes (Signed)
Pre visit review using our clinic review tool, if applicable. No additional management support is needed unless otherwise documented below in the visit note. 

## 2015-02-27 NOTE — Patient Instructions (Signed)

## 2015-02-27 NOTE — Progress Notes (Signed)
PA# [id number] FOR TCS AND EGD

## 2015-02-27 NOTE — Telephone Encounter (Signed)
[place] [facility] - [facility] Patient Name: [name] DOB: 12-07-1950 Initial Comment Caller states she is needing to speak with the physician on-call because Dr. Ronnald Ramp told her she can take one of her meds from 20 units to 10 units and she wants to eat a thanksgiving meal and wants to know what she should do. Nurse Assessment Nurse: Martyn Ehrich RN, Felicia Date/Time (Eastern Time): 02/27/2015 7:20:58 PM Confirm and document reason for call. If symptomatic, describe symptoms. ---PT is in assisted living and she saw Dr. Ronnald Ramp today. She told MD that her blood sugars have been in the 200 ranges. Blood sugar before evening meal was 222 at 4:52 pm, and 218 before lunch and before breakfast it was 217. MD asked how far she felt comfortable to adjusting her humalog to. now she take 10 units humalog before each meal. MD today said she could take 20 units before a meal but was not specific how many meals or which meal but when she saw the fax from the MD it said 10 units before each meal. She is going to a wonderful meal tomorrow at church (for thanksgiving) She is afraid she will end up in the [place]. She went to ER Weds related to her blood sugar and again for her asthma. Right now it has not been 2 hours since she ate. She saw MD after lunch today. No new symptoms. Has the patient traveled out of the country within the last 30 days? ---No Does the patient have any new or worsening symptoms? ---Yes Will a triage be completed? ---Yes Related visit to physician within the last 2 weeks? ---Yes Does the PT have any chronic conditions? (i.e. diabetes, asthma, etc.) ---Yes List chronic conditions. ---asthma, DM, acid reflux, chronic back pain Is this a behavioral health call? ---No Guidelines Guideline Title Affirmed Question Affirmed NotesFinal Disposition User Clinical Call Gaddy, RN, Felicia Comments Pt has never taken 20  units of humalog before a meal. - Suggested instead of [place] calling MD about increasing insulin - she make up a reasonable plate when she has thanksgiving lunch at church and then duplicate the plate to take home and eat it for evening meal - for example a serving of [place]. 2 to 3 - 1/2 cup servings of starches - without sugar ([place] dressing or fruit) and a cup of green vegetable - avoid desert and avoid marshmellow and avoid sweet tea - take home a plate of the same to eat for evening meal and she agreed. Referenced http://professional.diabetes.org/Comments pel/ALL-ABOUT-CARBOHYDRATE-COUNTING-ENGLISH which states eat about 45-60 gm carbohydrate at each meal (her wt is 170# and is NOT on coumadin) - she also states she has had training in diabetic diet and understood what nurse was saying Call Id: [id number]

## 2015-02-27 NOTE — Assessment & Plan Note (Addendum)
64 year old female with chronic GERD, worsening despite daily Prilosec. Recently switched to Protonix by other provider but has not started this yet as it has not been delivered. Some vague dysphagia, dyspepsia symptoms noted. Patient requesting EGD. Last in 2014 with [name]'s ring s/p dilation. Likely needs better dietary/behavior modification, and trial of different PPI; however, it is not unreasonable to repeat EGD due to refractory GERD, recurrent dysphagia and vague abdominal pain. Likely low yield but will be thorough. Agree with trial of Protonix.   Proceed with upper endoscopy +/- dilation in the near future with Dr. Gala Romney. The risks, benefits, and alternatives have been discussed in detail with patient. They have stated understanding and desire to proceed.  PROPOFOL due to polypharmacy As of note, patient asked that I clear this procedure with Dr. Annamaria Boots (pulmonologist). I have sent a message in epic, and he is aware with no concerns regarding procedure, as I had expected.

## 2015-02-27 NOTE — Telephone Encounter (Signed)
Patient needs an order faxed for the change in humalog (told to increase to 20 3x per day) to her facility   Fax # [phone] attn ann

## 2015-02-27 NOTE — Progress Notes (Signed)
Subjective:  Patient ID: [name], female    DOB: [birth year]  Age: 64 y.o. MRN: [id number]  CC: Hypertension; Hypothyroidism; and Diabetes   HPI [name] presents for f/up - she offers no new complaints today.  Outpatient Prescriptions Prior to Visit  Medication Sig Dispense Refill  . albuterol (PROVENTIL HFA;VENTOLIN HFA) 108 (90 BASE) MCG/ACT inhaler Inhale 2 puffs into the lungs every 6 (six) hours as needed for wheezing or shortness of breath. 1 Inhaler 2  . albuterol (PROVENTIL) (2.5 MG/3ML) 0.083% nebulizer solution Take 3 mLs (2.5 mg total) by nebulization every 6 (six) hours as needed for wheezing or shortness of breath. 75 mL 12  . ASPIRIN LOW DOSE 81 MG EC tablet TAKE 1 TABLET BY MOUTH ONCE DAILY. 30 tablet 11  . bisacodyl (DULCOLAX) 10 MG suppository Place 1 suppository (10 mg total) rectally as needed for moderate constipation. 12 suppository 0  . Blood Glucose Monitoring Suppl (ONE TOUCH ULTRA 2) W/DEVICE KIT Use to check blood sugars daily Dx e11.9 1 each 0  . buPROPion (WELLBUTRIN XL) 150 MG 24 hr tablet Take 1 tablet (150 mg total) by mouth daily. 30 tablet 1  . cephALEXin (KEFLEX) 500 MG capsule Take 1 capsule (500 mg total) by mouth 4 (four) times daily. 40 capsule 0  . fesoterodine (TOVIAZ) 8 MG TB24 tablet Take 1 tablet (8 mg total) by mouth daily. 30 tablet 11  . fexofenadine (ALLEGRA) 60 MG tablet Take 1 tablet (60 mg total) by mouth 2 (two) times daily. 60 tablet 11  . Fluticasone Furoate-Vilanterol (BREO ELLIPTA) 100-25 MCG/INH AEPB Inhale 1 puff then rinse mouth, once daily 60 each 11  . gabapentin (NEURONTIN) 300 MG capsule TAKE 1 CAPSULE BY MOUTH EVERY MORNING AND 2 CAPSULES AT BEDTIME. 90 capsule 11  . glucose blood (ONE TOUCH TEST STRIPS) test strip 1 each by Other route 2 (two) times daily. Use to check blood sugars twice a day Dx E11.9 100 each 3  . glucose blood test strip Use TID  E11.9 100 each 12  . glucose blood test strip Use TID E11.9  100 each 12  . Homeopathic Products (ARNICARE) GEL Apply 1 Act topically daily. 75 g 11  . HYDROcodone-acetaminophen (NORCO) 10-325 MG per tablet Take 1 tablet by mouth every 6 (six) hours as needed for moderate pain. 75 tablet 0  . Insulin Detemir (LEVEMIR FLEXPEN) 100 UNIT/ML Pen Inject 50 Units into the skin daily at 10 pm. (Patient taking differently: Inject 50 Units into the skin. Daily at 8 pm) 15 mL 11  . Insulin Lispro (HUMALOG KWIKPEN) 200 UNIT/ML SOPN Inject 10 Units into the skin 3 (three) times daily with meals. 9 mL 3  . Insulin Pen Needle (NOVOFINE) 30G X 8 MM MISC Use needle to inject insulin into the skin as directed. 200 each 2  . lamoTRIgine (LAMICTAL) 25 MG tablet Take 1 tablet (25 mg total) by mouth daily. 30 tablet 1  . levothyroxine (SYNTHROID, LEVOTHROID) 75 MCG tablet TAKE (1) TABLET BY MOUTH ONCE DAILY BEFORE BREAKFAST. 30 tablet 5  . loperamide (IMODIUM) 2 MG capsule Take 1 capsule (2 mg total) by mouth as needed for diarrhea or loose stools. 30 capsule 11  . metoprolol succinate (TOPROL-XL) 50 MG 24 hr tablet TAKE 1 TABLET BY MOUTH ONCE DAILY. 30 tablet 11  . miconazole (MONISTAT 1 COMBINATION PACK) kit Place 1 each vaginally once. 1 each 11  . Multiple Vitamins-Minerals (MULTIVITAMIN PO) Take 1 tablet by mouth daily.    Marland Kitchen  neomycin-polymyxin-pramoxine (NEOSPORIN PLUS) 1 % cream Apply topically 3 (three) times daily. 14.2 g 11  . Omega-3 Fatty Acids (FISH OIL PO) Take 1 capsule by mouth daily.    Marland Kitchen omeprazole (PRILOSEC) 20 MG capsule Take 20 mg by mouth daily.    . ondansetron (ZOFRAN ODT) 8 MG disintegrating tablet Take 1 tablet (8 mg total) by mouth every 8 (eight) hours as needed for nausea or vomiting. 20 tablet 0  . ONE TOUCH LANCETS MISC Use to help check blood sugars twice a day Dx E11.9 200 each 0  . polyethylene glycol (MIRALAX / GLYCOLAX) packet Take 17 g by mouth daily as needed for mild constipation.    . polyethylene glycol-electrolytes (NULYTELY/GOLYTELY)  420 G solution Take 4,000 mLs by mouth once. 4000 mL 0  . potassium chloride SA (K-DUR,KLOR-CON) 20 MEQ tablet TAKE 1 TABLET BY MOUTH ONCE DAILY. 30 tablet 11  . primidone (MYSOLINE) 50 MG tablet TAKE 3 TABLETS (151m) BY MOUTH TWICE DAILY. 180 tablet 5  . QUEtiapine (SEROQUEL) 50 MG tablet Take 1 tablet (50 mg total) by mouth at bedtime. 30 tablet 1  . ranitidine (ZANTAC) 150 MG tablet Take 150 mg by mouth at bedtime.     .Marland KitchenRespiratory Therapy Supplies (FLUTTER) DEVI Use as directed 1 each 0  . sodium chloride (OCEAN) 0.65 % SOLN nasal spray 1-2 sprays each nostril up to 4 times daily as needed 30 mL prn  . sucralfate (CARAFATE) 1 G tablet Take 1 tablet (1 g total) by mouth 4 (four) times daily. 120 tablet 1  . tiZANidine (ZANAFLEX) 4 MG tablet Take 4 mg by mouth at bedtime.     . triamterene-hydrochlorothiazide (MAXZIDE-25) 37.5-25 MG tablet TAKE 1 TABLET BY MOUTH ONCE DAILY. 30 tablet 11  . Nutritional Supplements (BOOST DIABETIC) LIQD Take 1 Act by mouth daily. (Patient not taking: Reported on 02/25/2015) 237 mL 11  . pantoprazole (PROTONIX) 40 MG tablet Take 40 mg by mouth daily.      No facility-administered medications prior to visit.    ROS Review of Systems  Constitutional: Negative.  Negative for fever, chills, diaphoresis, appetite change and fatigue.  HENT: Positive for trouble swallowing.   Eyes: Negative.   Respiratory: Negative.  Negative for cough, choking, chest tightness, shortness of breath and stridor.   Cardiovascular: Negative.  Negative for chest pain, palpitations and leg swelling.  Gastrointestinal: Positive for constipation. Negative for nausea, vomiting, abdominal pain, diarrhea and blood in stool.  Endocrine: Negative.   Genitourinary: Negative.   Musculoskeletal: Positive for arthralgias and gait problem. Negative for back pain.  Skin: Negative.  Negative for pallor and rash.  Allergic/Immunologic: Negative.   Neurological: Positive for tremors.    Hematological: Negative.  Negative for adenopathy. Does not bruise/bleed easily.  Psychiatric/Behavioral: Positive for sleep disturbance. Negative for suicidal ideas, dysphoric mood and agitation. The patient is not nervous/anxious.     Objective:  BP 130/88 mmHg  Pulse 93  Temp(Src) 98.1 F (36.7 C) (Oral)  Ht '5\' 4"'  (1.626 m)  Wt 174 lb (78.926 kg)  BMI 29.85 kg/m2  SpO2 97%  BP Readings from Last 3 Encounters:  02/27/15 130/88  02/25/15 141/99  02/23/15 148/88    Wt Readings from Last 3 Encounters:  02/27/15 174 lb (78.926 kg)  02/25/15 172 lb (78.019 kg)  02/23/15 172 lb (78.019 kg)    Physical Exam  Constitutional: She is oriented to person, place, and time. No distress.  HENT:  Head: Normocephalic and atraumatic.  Mouth/Throat:  Oropharynx is clear and moist. No oropharyngeal exudate.  Eyes: Conjunctivae are normal. Right eye exhibits no discharge. Left eye exhibits no discharge. No scleral icterus.  Neck: Normal range of motion. Neck supple. No JVD present. No tracheal deviation present. No thyromegaly present.  Cardiovascular: Normal rate, regular rhythm, normal heart sounds and intact distal pulses.  Exam reveals no gallop and no friction rub.   No murmur heard. Pulmonary/Chest: Effort normal and breath sounds normal. No stridor. No respiratory distress. She has no wheezes. She has no rales. She exhibits no tenderness.  Abdominal: Soft. Bowel sounds are normal. She exhibits no distension and no mass. There is no tenderness. There is no rebound and no guarding.  Musculoskeletal: Normal range of motion. She exhibits no edema or tenderness.  Lymphadenopathy:    She has no cervical adenopathy.  Neurological: She is oriented to person, place, and time.  Skin: Skin is warm and dry. No rash noted. She is not diaphoretic. No erythema. No pallor.  Vitals reviewed.   Lab Results  Component Value Date   WBC 11.8* 02/25/2015   HGB 13.2 02/25/2015   HCT 43.2 02/25/2015    PLT 168 02/25/2015   GLUCOSE 201* 02/25/2015   CHOL 180 02/13/2015   TRIG 175.0* 02/13/2015   HDL 64.80 02/13/2015   LDLCALC 80 02/13/2015   ALT 36 01/22/2015   AST 35 01/22/2015   NA 130* 02/25/2015   K 4.2 02/25/2015   CL 94* 02/25/2015   CREATININE 0.70 02/25/2015   BUN 15 02/25/2015   CO2 26 02/25/2015   TSH 1.18 02/13/2015   INR 0.99 01/04/2015   HGBA1C 7.0* 02/13/2015   MICROALBUR 1.7 02/13/2015    No results found.  Assessment & Plan:   Jordayn was seen today for hypertension, hypothyroidism and diabetes.  Diagnoses and all orders for this visit:  Type 2 diabetes mellitus with complication, with long-term current use of insulin ([place]) -     Ambulatory referral to Ophthalmology  Other orders -     Cancel: Shampoos (DRY SHAMPOO) AERO; Apply 1 spray topically as needed. -     pantoprazole (PROTONIX) 40 MG tablet; Take 1 tablet (40 mg total) by mouth daily. -     Nutritional Supplements (BOOST DIABETIC) LIQD; Take 1 Act by mouth daily. -     Shampoos (DRY SHAMPOO) AERO; Apply 1 spray topically as needed. -     Sanitary Napkins & Tampons (EQ MAXI OVERNIGHT EXTRA HEAVY) PADS; Use as needed  I have changed Ms. Mcatee's pantoprazole. I am also having her start on Dry Shampoo and EQ MAXI OVERNIGHT EXTRA HEAVY. Additionally, I am having her maintain her Multiple Vitamins-Minerals (MULTIVITAMIN PO), tiZANidine, ondansetron, albuterol, albuterol, HYDROcodone-acetaminophen, fesoterodine, gabapentin, ASPIRIN LOW DOSE, metoprolol succinate, potassium chloride SA, primidone, triamterene-hydrochlorothiazide, levothyroxine, polyethylene glycol, glucose blood, glucose blood, FLUTTER, ONE TOUCH ULTRA 2, glucose blood, ONE TOUCH LANCETS, Insulin Lispro, Fluticasone Furoate-Vilanterol, fexofenadine, sodium chloride, neomycin-polymyxin-pramoxine, loperamide, ARNICARE, Insulin Detemir, Insulin Pen Needle, miconazole, ranitidine, cephALEXin, polyethylene glycol-electrolytes, buPROPion,  lamoTRIgine, sucralfate, QUEtiapine, bisacodyl, omeprazole, Omega-3 Fatty Acids (FISH OIL PO), GAVILYTE-G, and BOOST DIABETIC.  Meds ordered this encounter  Medications  . GAVILYTE-G 236 G solution    Sig:   . pantoprazole (PROTONIX) 40 MG tablet    Sig: Take 1 tablet (40 mg total) by mouth daily.    Dispense:  90 tablet    Refill:  0  . Nutritional Supplements (BOOST DIABETIC) LIQD    Sig: Take 1 Act by mouth daily.  Dispense:  237 mL    Refill:  11  . Shampoos (DRY SHAMPOO) AERO    Sig: Apply 1 spray topically as needed.    Dispense:  142 g    Refill:  4  . Sanitary Napkins & Tampons (EQ MAXI OVERNIGHT EXTRA HEAVY) PADS    Sig: Use as needed    Dispense:  20 each    Refill:  5     Follow-up: Return in about 4 months (around 06/27/2015).  Scarlette Calico, MD

## 2015-02-27 NOTE — Assessment & Plan Note (Signed)
Due for routine screening now. Last in Oct 2005. No concerning lower GI symptoms, and chronic constipation appears to respond quite well with mainly dietary intervention of oatmeal daily. Occasional Miralax use noted. May want to consider Linzess in future if worsening of constipation, but she reports good results with oatmeal daily.   Proceed with TCS with Dr. Gala Romney in near future: the risks, benefits, and alternatives have been discussed with the patient in detail. The patient states understanding and desires to proceed. PROPOFOL due to polypharmacy

## 2015-02-28 NOTE — Assessment & Plan Note (Signed)
Her last TSh was in the normal range Will cont synthroid at the current dose

## 2015-02-28 NOTE — Assessment & Plan Note (Signed)
Her BP is well controlled 

## 2015-03-01 ENCOUNTER — Telehealth: Payer: Self-pay | Admitting: *Deleted

## 2015-03-01 NOTE — Telephone Encounter (Signed)
-----   Message from Kara Mead sent at 02/22/2015  2:52 PM EST ----- Regarding: HST F/U appt [name], [place] is scheduled to pick up HST from me 03/07/15 but she is not scheduled to see Dr. Annamaria Boots again until 05/29/15.  Does she need a 2 wk f/u after the HST?    Thanks, [employer]

## 2015-03-01 NOTE — Telephone Encounter (Signed)
Spoke with patient-she can see CY on Friday 03-22-2015 at 11:30am. Pt requested that CY give her a verbal okay to have endo and colonoscopy on Monday 03/04/15. CY informed me that he rec'd forms for clearance last week and this has already been faxed back. Pt is aware and no further questions or concerns at this time.

## 2015-03-01 NOTE — Telephone Encounter (Signed)
Patient will need OV in 2 weeks after 03/07/15 for HST follow up per [place]. Where can we place on schedule?   Katie - please advise.

## 2015-03-04 ENCOUNTER — Ambulatory Visit (HOSPITAL_COMMUNITY): Payer: Commercial Managed Care - HMO | Admitting: Anesthesiology

## 2015-03-04 ENCOUNTER — Ambulatory Visit (HOSPITAL_BASED_OUTPATIENT_CLINIC_OR_DEPARTMENT_OTHER)
Admission: RE | Admit: 2015-03-04 | Discharge: 2015-03-04 | Disposition: A | Discharge status: Home / Self Care | Payer: Commercial Managed Care - HMO | Source: Ambulatory Visit | Attending: Internal Medicine | Admitting: Internal Medicine

## 2015-03-04 ENCOUNTER — Encounter (HOSPITAL_COMMUNITY): Payer: Self-pay | Admitting: *Deleted

## 2015-03-04 ENCOUNTER — Encounter (HOSPITAL_COMMUNITY)
Admission: RE | Disposition: A | Discharge status: Home / Self Care | Payer: Self-pay | Source: Ambulatory Visit | Attending: Internal Medicine

## 2015-03-04 ENCOUNTER — Emergency Department (HOSPITAL_COMMUNITY)
Admission: EM | Admit: 2015-03-04 | Discharge: 2015-03-04 | Disposition: A | Discharge status: Home / Self Care | Payer: Commercial Managed Care - HMO | Attending: Emergency Medicine | Admitting: Emergency Medicine

## 2015-03-04 ENCOUNTER — Encounter (HOSPITAL_COMMUNITY): Payer: Self-pay | Admitting: Emergency Medicine

## 2015-03-04 DIAGNOSIS — K625 Hemorrhage of anus and rectum: Secondary | ICD-10-CM | POA: Insufficient documentation

## 2015-03-04 DIAGNOSIS — K3189 Other diseases of stomach and duodenum: Secondary | ICD-10-CM | POA: Diagnosis not present

## 2015-03-04 DIAGNOSIS — R1310 Dysphagia, unspecified: Secondary | ICD-10-CM | POA: Insufficient documentation

## 2015-03-04 DIAGNOSIS — K222 Esophageal obstruction: Secondary | ICD-10-CM | POA: Diagnosis not present

## 2015-03-04 DIAGNOSIS — K648 Other hemorrhoids: Secondary | ICD-10-CM | POA: Insufficient documentation

## 2015-03-04 DIAGNOSIS — Z87891 Personal history of nicotine dependence: Secondary | ICD-10-CM | POA: Insufficient documentation

## 2015-03-04 DIAGNOSIS — K5900 Constipation, unspecified: Secondary | ICD-10-CM | POA: Insufficient documentation

## 2015-03-04 DIAGNOSIS — D124 Benign neoplasm of descending colon: Secondary | ICD-10-CM | POA: Diagnosis not present

## 2015-03-04 DIAGNOSIS — Z794 Long term (current) use of insulin: Secondary | ICD-10-CM | POA: Insufficient documentation

## 2015-03-04 DIAGNOSIS — F419 Anxiety disorder, unspecified: Secondary | ICD-10-CM | POA: Insufficient documentation

## 2015-03-04 DIAGNOSIS — Z1211 Encounter for screening for malignant neoplasm of colon: Secondary | ICD-10-CM | POA: Insufficient documentation

## 2015-03-04 DIAGNOSIS — G250 Essential tremor: Secondary | ICD-10-CM | POA: Insufficient documentation

## 2015-03-04 DIAGNOSIS — J45909 Unspecified asthma, uncomplicated: Secondary | ICD-10-CM | POA: Diagnosis not present

## 2015-03-04 DIAGNOSIS — Z801 Family history of malignant neoplasm of trachea, bronchus and lung: Secondary | ICD-10-CM | POA: Insufficient documentation

## 2015-03-04 DIAGNOSIS — K449 Diaphragmatic hernia without obstruction or gangrene: Secondary | ICD-10-CM | POA: Insufficient documentation

## 2015-03-04 DIAGNOSIS — E039 Hypothyroidism, unspecified: Secondary | ICD-10-CM | POA: Insufficient documentation

## 2015-03-04 DIAGNOSIS — I10 Essential (primary) hypertension: Secondary | ICD-10-CM | POA: Insufficient documentation

## 2015-03-04 DIAGNOSIS — K219 Gastro-esophageal reflux disease without esophagitis: Secondary | ICD-10-CM | POA: Insufficient documentation

## 2015-03-04 DIAGNOSIS — F329 Major depressive disorder, single episode, unspecified: Secondary | ICD-10-CM | POA: Insufficient documentation

## 2015-03-04 DIAGNOSIS — Z860100 Personal history of colon polyps, unspecified: Secondary | ICD-10-CM | POA: Insufficient documentation

## 2015-03-04 DIAGNOSIS — K2970 Gastritis, unspecified, without bleeding: Secondary | ICD-10-CM | POA: Diagnosis not present

## 2015-03-04 DIAGNOSIS — Q394 Esophageal web: Secondary | ICD-10-CM | POA: Diagnosis not present

## 2015-03-04 DIAGNOSIS — Z7982 Long term (current) use of aspirin: Secondary | ICD-10-CM | POA: Insufficient documentation

## 2015-03-04 DIAGNOSIS — K5730 Diverticulosis of large intestine without perforation or abscess without bleeding: Secondary | ICD-10-CM | POA: Insufficient documentation

## 2015-03-04 DIAGNOSIS — Z79899 Other long term (current) drug therapy: Secondary | ICD-10-CM | POA: Diagnosis not present

## 2015-03-04 DIAGNOSIS — E119 Type 2 diabetes mellitus without complications: Secondary | ICD-10-CM | POA: Diagnosis not present

## 2015-03-04 HISTORY — PX: BIOPSY: SHX5522

## 2015-03-04 HISTORY — PX: ESOPHAGOGASTRODUODENOSCOPY (EGD) WITH PROPOFOL: SHX5813

## 2015-03-04 HISTORY — PX: POLYPECTOMY: SHX5525

## 2015-03-04 HISTORY — PX: COLONOSCOPY WITH PROPOFOL: SHX5780

## 2015-03-04 HISTORY — PX: ESOPHAGEAL DILATION: SHX303

## 2015-03-04 LAB — CBC WITH DIFFERENTIAL/PLATELET
Basophils Absolute: 0 10*3/uL (ref 0.0–0.1)
Basophils Relative: 0 %
Eosinophils Absolute: 0.1 10*3/uL (ref 0.0–0.7)
Eosinophils Relative: 1 %
HCT: 34.8 % — ABNORMAL LOW (ref 36.0–46.0)
Hemoglobin: 12.3 g/dL (ref 12.0–15.0)
Lymphocytes Relative: 29 %
Lymphs Abs: 2.3 10*3/uL (ref 0.7–4.0)
MCH: 31.1 pg (ref 26.0–34.0)
MCHC: 35.3 g/dL (ref 30.0–36.0)
MCV: 88.1 fL (ref 78.0–100.0)
Monocytes Absolute: 0.5 10*3/uL (ref 0.1–1.0)
Monocytes Relative: 6 %
Neutro Abs: 5 10*3/uL (ref 1.7–7.7)
Neutrophils Relative %: 64 %
Platelets: 172 10*3/uL (ref 150–400)
RBC: 3.95 MIL/uL (ref 3.87–5.11)
RDW: 12.5 % (ref 11.5–15.5)
WBC: 7.9 10*3/uL (ref 4.0–10.5)

## 2015-03-04 LAB — BASIC METABOLIC PANEL
Anion gap: 9 (ref 5–15)
BUN: 9 mg/dL (ref 6–20)
CO2: 28 mmol/L (ref 22–32)
Calcium: 8.6 mg/dL — ABNORMAL LOW (ref 8.9–10.3)
Chloride: 95 mmol/L — ABNORMAL LOW (ref 101–111)
Creatinine, Ser: 0.64 mg/dL (ref 0.44–1.00)
GFR calc Af Amer: >60 mL/min (ref >60)
GFR calc non Af Amer: >60 mL/min (ref >60)
Glucose, Bld: 202 mg/dL — ABNORMAL HIGH (ref 65–99)
Potassium: 3 mmol/L — ABNORMAL LOW (ref 3.5–5.1)
Sodium: 132 mmol/L — ABNORMAL LOW (ref 135–145)

## 2015-03-04 LAB — TYPE AND SCREEN
ABO/RH(D): O POS
Antibody Screen: NEGATIVE

## 2015-03-04 LAB — GLUCOSE, CAPILLARY
Glucose-Capillary: 179 mg/dL — ABNORMAL HIGH (ref 65–99)
Glucose-Capillary: 154 mg/dL — ABNORMAL HIGH (ref 65–99)

## 2015-03-04 SURGERY — COLONOSCOPY WITH PROPOFOL
Anesthesia: Monitor Anesthesia Care

## 2015-03-04 MED ORDER — FENTANYL CITRATE (PF) 100 MCG/2ML IJ SOLN
25.0000 ug | INTRAMUSCULAR | Status: AC
  Administered 2015-03-04 (×2): 25 ug via INTRAVENOUS

## 2015-03-04 MED ORDER — ONDANSETRON HCL 4 MG/2ML IJ SOLN
INTRAMUSCULAR | Status: AC
  Filled 2015-03-04: qty 2

## 2015-03-04 MED ORDER — PROPOFOL 10 MG/ML IV BOLUS
INTRAVENOUS | Status: AC
  Filled 2015-03-04: qty 20

## 2015-03-04 MED ORDER — ONDANSETRON HCL 4 MG/2ML IJ SOLN: 4.0000 mg | Freq: Once | INTRAMUSCULAR | Status: DC | PRN

## 2015-03-04 MED ORDER — PROPOFOL 500 MG/50ML IV EMUL
INTRAVENOUS | Status: DC | PRN
  Administered 2015-03-04 (×2): 75 ug/kg/min via INTRAVENOUS
  Administered 2015-03-04: 10:00:00 via INTRAVENOUS

## 2015-03-04 MED ORDER — ONDANSETRON HCL 4 MG/2ML IJ SOLN
4.0000 mg | Freq: Once | INTRAMUSCULAR | Status: AC
  Administered 2015-03-04: 4 mg via INTRAVENOUS

## 2015-03-04 MED ORDER — MIDAZOLAM HCL 2 MG/2ML IJ SOLN
1.0000 mg | INTRAMUSCULAR | Status: DC | PRN
  Administered 2015-03-04: 2 mg via INTRAVENOUS

## 2015-03-04 MED ORDER — MIDAZOLAM HCL 2 MG/2ML IJ SOLN: 1.0000 mg | INTRAMUSCULAR | Status: DC | PRN

## 2015-03-04 MED ORDER — POTASSIUM CHLORIDE CRYS ER 20 MEQ PO TBCR
40.0000 meq | EXTENDED_RELEASE_TABLET | Freq: Once | ORAL | Status: AC
  Administered 2015-03-04: 40 meq via ORAL
  Filled 2015-03-04: qty 2

## 2015-03-04 MED ORDER — MIDAZOLAM HCL 2 MG/2ML IJ SOLN
INTRAMUSCULAR | Status: AC
  Filled 2015-03-04: qty 2

## 2015-03-04 MED ORDER — FENTANYL CITRATE (PF) 100 MCG/2ML IJ SOLN
INTRAMUSCULAR | Status: AC
  Filled 2015-03-04: qty 2

## 2015-03-04 MED ORDER — FENTANYL CITRATE (PF) 100 MCG/2ML IJ SOLN: 25.0000 ug | INTRAMUSCULAR | Status: DC | PRN

## 2015-03-04 MED ORDER — LIDOCAINE VISCOUS 2 % MT SOLN
15.0000 mL | Freq: Once | OROMUCOSAL | Status: AC
  Administered 2015-03-04: 15 mL via OROMUCOSAL
  Filled 2015-03-04: qty 15

## 2015-03-04 MED ORDER — LACTATED RINGERS IV SOLN
INTRAVENOUS | Status: DC
  Administered 2015-03-04: 09:00:00 via INTRAVENOUS

## 2015-03-04 MED ORDER — PROPOFOL 10 MG/ML IV BOLUS
INTRAVENOUS | Status: AC
  Filled 2015-03-04: qty 40

## 2015-03-04 MED ORDER — STERILE WATER FOR IRRIGATION IR SOLN
Status: DC | PRN
  Administered 2015-03-04: 1000 mL

## 2015-03-04 MED ORDER — MIDAZOLAM HCL 5 MG/5ML IJ SOLN
INTRAMUSCULAR | Status: DC | PRN
  Administered 2015-03-04: 2 mg via INTRAVENOUS

## 2015-03-04 SURGICAL SUPPLY — 13 items
BLOCK BITE 60FR ADLT L/F BLUE (MISCELLANEOUS) ×3 IMPLANT
FLOOR PAD 36X40 (MISCELLANEOUS) ×3
FORCEPS BIOP RAD 4 LRG CAP 4 (CUTTING FORCEPS) ×3 IMPLANT
FORMALIN 10 PREFIL 20ML (MISCELLANEOUS) ×9 IMPLANT
KIT ENDO PROCEDURE PEN (KITS) ×6 IMPLANT
MANIFOLD NEPTUNE II (INSTRUMENTS) ×3 IMPLANT
PAD FLOOR 36X40 (MISCELLANEOUS) ×2 IMPLANT
SNARE ROTATE MED OVAL 20MM (MISCELLANEOUS) ×3 IMPLANT
SYR 50ML LL SCALE MARK (SYRINGE) ×3 IMPLANT
TRAP SPECIMEN MUCOUS 40CC (MISCELLANEOUS) ×3 IMPLANT
TUBING INSUFFLATOR CO2MPACT (TUBING) ×3 IMPLANT
TUBING IRRIGATION ENDOGATOR (MISCELLANEOUS) ×3 IMPLANT
WATER STERILE IRR 1000ML POUR (IV SOLUTION) ×3 IMPLANT

## 2015-03-04 NOTE — Transfer of Care (Signed)
Immediate Anesthesia Transfer of Care Note  Patient: [name]  Procedure(s) Performed: Procedure(s): COLONOSCOPY WITH PROPOFOL at cecum at 1011; withdrawal time=51minutes (N/A) ESOPHAGOGASTRODUODENOSCOPY (EGD) WITH PROPOFOL Procedure #1 (N/A) ESOPHAGEAL DILATION WITH 54FR MALONEY DILATOR (N/A) BIOPSY (Duodenal, Gastric) POLYPECTOMY (descending colon)  Patient Location: PACU  Anesthesia Type:MAC  Level of Consciousness: awake, alert , oriented and patient cooperative  Airway & Oxygen Therapy: Patient Spontanous Breathing and Patient connected to face mask oxygen  Post-op Assessment: Report given to RN, Post -op Vital signs reviewed and stable and Patient moving all extremities  Post vital signs: Reviewed and stable  Last Vitals:  Filed Vitals:   03/04/15 0920 03/04/15 0925  BP: 144/84 128/78  Resp: 12 17    Complications: No apparent anesthesia complications

## 2015-03-04 NOTE — Anesthesia Postprocedure Evaluation (Signed)
Anesthesia Post Note  Patient: [name]  Procedure(s) Performed: Procedure(s) (LRB): COLONOSCOPY WITH PROPOFOL at cecum at 1011; withdrawal time=71minutes (N/A) ESOPHAGOGASTRODUODENOSCOPY (EGD) WITH PROPOFOL Procedure #1 (N/A) ESOPHAGEAL DILATION WITH 54FR MALONEY DILATOR (N/A) BIOPSY (Duodenal, Gastric) POLYPECTOMY (descending colon)  Patient location during evaluation: PACU Anesthesia Type: MAC Level of consciousness: awake and alert Pain management: pain level controlled Vital Signs Assessment: post-procedure vital signs reviewed and stable Respiratory status: spontaneous breathing and nonlabored ventilation Cardiovascular status: stable Anesthetic complications: no    Last Vitals:  Filed Vitals:   03/04/15 0925 03/04/15 1030  BP: 128/78 114/61  Pulse:  100  Temp:  36.7 C  Resp: 17 14    Last Pain: There were no vitals filed for this visit.               Haward Pope J

## 2015-03-04 NOTE — Telephone Encounter (Signed)
Patient called and informed me that she was passing blood per rectum. She denied abdominal pain or postural symptoms. She EGD  And Colonoscopy earlier today by Dr. Gala Romney. Patient was advised to go to ER at [facility] and ER MD was notified.

## 2015-03-04 NOTE — ED Notes (Signed)
Pt states she had on bowel movement today after colonoscopy and stool was dark and bloody per pt.

## 2015-03-04 NOTE — Interval H&P Note (Signed)
History and Physical Interval Note:  03/04/2015 9:23 AM  [name]  has presented today for surgery, with the diagnosis of GERD, screening  The various methods of treatment have been discussed with the patient and family. After consideration of risks, benefits and other options for treatment, the patient has consented to  Procedure(s): COLONOSCOPY WITH PROPOFOL (N/A) ESOPHAGOGASTRODUODENOSCOPY (EGD) WITH PROPOFOL (N/A) as a surgical intervention .  The patient's history has been reviewed, patient examined, no change in status, stable for surgery.  I have reviewed the patient's chart and labs.  Questions were answered to the patient's satisfaction.     Robert [name]  Pt with dysphagia as well; o/w no change.  Plan for EGD w ED and screening TCS per plan.  The risks, benefits, limitations, imponderables and alternatives regarding both EGD and colonoscopy have been reviewed with the patient. Questions have been answered. All parties agreeable.

## 2015-03-04 NOTE — Progress Notes (Signed)
CC'D TO PCP °

## 2015-03-04 NOTE — Op Note (Signed)
[facility] [address] [place], [address]   ENDOSCOPY PROCEDURE REPORT  PATIENT: [name], [name]  MR#: [id number] BIRTHDATE: 04-May-1950 , 79  yrs. old GENDER: female ENDOSCOPIST: R.  Garfield Cornea, MD FACP FACG REFERRED BY:  Janith Lima, M.D. PROCEDURE DATE:  March 05, 2015 PROCEDURE:  EGD w/ biopsy and Maloney dilation of esophagus INDICATIONS:  esophageal dysphagia; refractory GERD. MEDICATIONS: Deep sedation per Dr.  [facility] ASA CLASS:      Class III  CONSENT: The risks, benefits, limitations, alternatives and imponderables have been discussed.  The potential for biopsy, esophogeal dilation, etc. have also been reviewed.  Questions have been answered.  All parties agreeable.  Please see the history and physical in the medical record for more information.  DESCRIPTION OF PROCEDURE: After the risks benefits and alternatives of the procedure were thoroughly explained, informed consent was obtained.  The    endoscope was introduced through the mouth and advanced to the second portion of the duodenum , limited by Without limitations. The instrument was slowly withdrawn as the mucosa was fully examined. Estimated blood loss is zero unless otherwise noted in this procedure report.    Noncritical [name]'s ring.  Somewhat patulous EG junction.  No esophagitis.  No Barrett's epithelium seen.  Stomach empty.  2 cm hiatal hernia.  Scattered erosions.  No ulcer or infiltrating process.  Patent pylorus.  Examination of bulb and second portion revealed multiple erosions only.  The scope was withdrawn and a 54 [place] Maloney dilator was passed to full insertion easily.  A look back revealed no apparent complication related to this maneuver.  Subsequently, biopsies of the abnormal gastric and duodenal mucosa taken.  Retroflexed views revealed as previously described.     The scope was then withdrawn from the patient and the procedure  completed.  COMPLICATIONS: There were no immediate complications. EBL 3 mL ENDOSCOPIC IMPRESSION: Noncritical. [name]'s ring?"status post Maloney dilation.   Hiatal hernia. Abnormal stomach and duodenum as described?"status post biopsy  RECOMMENDATIONS: Further recommendations to follow pending review of pathology report. See colonoscopy report.  REPEAT EXAM:  eSigned:  R. Garfield Cornea, MD Rosalita Chessman [facility] March 05, 2015 10:29 AM    CC:  CPT CODES: ICD CODES:  The ICD and CPT codes recommended by this software are interpretations from the data that the clinical staff has captured with the software.  The verification of the translation of this report to the ICD and CPT codes and modifiers is the sole responsibility of the health care institution and practicing physician where this report was generated.  [employer]. will not be held responsible for the validity of the ICD and CPT codes included on this report.  AMA assumes no liability for data contained or not contained herein. CPT is a Designer, television/film set of the [employer].  PATIENT NAME:  [name], [name] MR#: [id number]

## 2015-03-04 NOTE — Discharge Instructions (Signed)

## 2015-03-04 NOTE — Op Note (Signed)
[facility] [address] [place], [address]   COLONOSCOPY PROCEDURE REPORT  PATIENT: Elizabeth, [name]  MR#: [id number] BIRTHDATE: 1950/06/28 , 44  yrs. old GENDER: female ENDOSCOPIST: R.  Garfield Cornea, MD FACP [facility] REFERRED [id number] Evalina Field, M.D. PROCEDURE DATE:  06-Mar-2015 PROCEDURE:   Colonoscopy with snare polypectomy INDICATIONS:Average risk colorectal cancer screening examination. MEDICATIONS: Deep sedation per Dr.  [facility] CLASS:       Class II  CONSENT: The risks, benefits, alternatives and imponderables including but not limited to bleeding, perforation as well as the possibility of a missed lesion have been reviewed.  The potential for biopsy, lesion removal, etc. have also been discussed. Questions have been answered.  All parties agreeable.  Please see the history and physical in the medical record for more information.  DESCRIPTION OF PROCEDURE:   After the risks benefits and alternatives of the procedure were thoroughly explained, informed consent was obtained.  The digital rectal exam revealed no abnormalities of the rectum.   The     endoscope was introduced through the anus and advanced to the cecum, which was identified by both the appendix and ileocecal valve. No adverse events experienced.   The quality of the prep was adequate  The instrument was then slowly withdrawn as the colon was fully examined. Estimated blood loss is zero unless otherwise noted in this procedure report.      COLON FINDINGS: Prominent internal hemorrhoids; otherwise normal-appearing rectum.  Pancolonic diverticula; (1) 6 mm polyp in the mid descending segment; otherwise, the remainder of the colonic mucosa appeared normal.  Retroflexion was performed. .  Withdrawal time=13 minutes 0 seconds.  The scope was withdrawn and the procedure completed. COMPLICATIONS: There were no immediate complications. EBL 3 mL ENDOSCOPIC  IMPRESSION: Internal hemorrhoids. Pancolonic diverticulosis. Colonic polyp?"removed as described above.  RECOMMENDATIONS: Follow-up on pathology. Outpatient office visit for hemorrhoid banding per patient request. See EGD report.  eSigned:  R. Garfield Cornea, MD [facility] 2015-03-06 10:34 AM   cc:  CPT CODES: ICD CODES:  The ICD and CPT codes recommended by this software are interpretations from the data that the clinical staff has captured with the software.  The verification of the translation of this report to the ICD and CPT codes and modifiers is the sole responsibility of the health care institution and practicing physician where this report was generated.  [place]. will not be held responsible for the validity of the ICD and CPT codes included on this report.  AMA assumes no liability for data contained or not contained herein. CPT is a Designer, television/film set of the [employer].  PATIENT NAME:  Elizabeth, [name] MR#: [id number]

## 2015-03-04 NOTE — Discharge Instructions (Signed)
Colonoscopy Discharge Instructions  Read the instructions outlined below and refer to this sheet in the next few weeks. These discharge instructions provide you with general information on caring for yourself after you leave the hospital. Your doctor may also give you specific instructions. While your treatment has been planned according to the most current medical practices available, unavoidable complications occasionally occur. If you have any problems or questions after discharge, call Dr. Gala Romney at [phone]. ACTIVITY  You may resume your regular activity, but move at a slower pace for the next 24 hours.   Take frequent rest periods for the next 24 hours.   Walking will help get rid of the air and reduce the bloated feeling in your belly (abdomen).   No driving for 24 hours (because of the medicine (anesthesia) used during the test).    Do not sign any important legal documents or operate any machinery for 24 hours (because of the anesthesia used during the test).  NUTRITION  Drink plenty of fluids.   You may resume your normal diet as instructed by your doctor.   Begin with a light meal and progress to your normal diet. Heavy or fried foods are harder to digest and may make you feel sick to your stomach (nauseated).   Avoid alcoholic beverages for 24 hours or as instructed.  MEDICATIONS  You may resume your normal medications unless your doctor tells you otherwise.  WHAT YOU CAN EXPECT TODAY  Some feelings of bloating in the abdomen.   Passage of more gas than usual.   Spotting of blood in your stool or on the toilet paper.  IF YOU HAD POLYPS REMOVED DURING THE COLONOSCOPY:  No aspirin products for 7 days or as instructed.   No alcohol for 7 days or as instructed.   Eat a soft diet for the next 24 hours.  FINDING OUT THE RESULTS OF YOUR TEST Not all test results are available during your visit. If your test results are not back during the visit, make an appointment  with your caregiver to find out the results. Do not assume everything is normal if you have not heard from your caregiver or the medical facility. It is important for you to follow up on all of your test results.  SEEK IMMEDIATE MEDICAL ATTENTION IF:  You have more than a spotting of blood in your stool.   Your belly is swollen (abdominal distention).   You are nauseated or vomiting.   You have a temperature over 101.  You have abdominal pain or discomfort that is severe or gets worse throughout the day. EGD Discharge instructions Please read the instructions outlined below and refer to this sheet in the next few weeks. These discharge instructions provide you with general information on caring for yourself after you leave the hospital. Your doctor may also give you specific instructions. While your treatment has been planned according to the most current medical practices available, unavoidable complications occasionally occur. If you have any problems or questions after discharge, please call your doctor. ACTIVITY You may resume your regular activity but move at a slower pace for the next 24 hours.  Take frequent rest periods for the next 24 hours.  Walking will help expel (get rid of) the air and reduce the bloated feeling in your abdomen.  No driving for 24 hours (because of the anesthesia (medicine) used during the test).  You may shower.  Do not sign any important legal documents or operate any machinery for 24  hours (because of the anesthesia used during the test).  NUTRITION Drink plenty of fluids.  You may resume your normal diet.  Begin with a light meal and progress to your normal diet.  Avoid alcoholic beverages for 24 hours or as instructed by your caregiver.  MEDICATIONS You may resume your normal medications unless your caregiver tells you otherwise.  WHAT YOU CAN EXPECT TODAY You may experience abdominal discomfort such as a feeling of fullness or gas pains.   FOLLOW-UP Your doctor will discuss the results of your test with you.  SEEK IMMEDIATE MEDICAL ATTENTION IF ANY OF THE FOLLOWING OCCUR: Excessive nausea (feeling sick to your stomach) and/or vomiting.  Severe abdominal pain and distention (swelling).  Trouble swallowing.  Temperature over 101 F (37.8 C).  Rectal bleeding or vomiting of blood.     Diverticulosis and colon polyp information provided  Further recommendations to follow pending review of pathology report  Office visit with me in 4-6 weeks for hemorrhoid banding  Diverticulosis Diverticulosis is the condition that develops when small pouches (diverticula) form in the wall of your colon. Your colon, or large intestine, is where water is absorbed and stool is formed. The pouches form when the inside layer of your colon pushes through weak spots in the outer layers of your colon. CAUSES  No one knows exactly what causes diverticulosis. RISK FACTORS  Being older than 87. Your risk for this condition increases with age. Diverticulosis is rare in people younger than 40 years. By age 53, almost everyone has it.  Eating a low-fiber diet.  Being frequently constipated.  Being overweight.  Not getting enough exercise.  Smoking.  Taking over-the-counter pain medicines, like aspirin and ibuprofen. SYMPTOMS  Most people with diverticulosis do not have symptoms. DIAGNOSIS  Because diverticulosis often has no symptoms, health care providers often discover the condition during an exam for other colon problems. In many cases, a health care provider will diagnose diverticulosis while using a flexible scope to examine the colon (colonoscopy). TREATMENT  If you have never developed an infection related to diverticulosis, you may not need treatment. If you have had an infection before, treatment may include:  Eating more fruits, vegetables, and grains.  Taking a fiber supplement.  Taking a live bacteria supplement  (probiotic).  Taking medicine to relax your colon. HOME CARE INSTRUCTIONS   Drink at least 6-8 glasses of water each day to prevent constipation.  Try not to strain when you have a bowel movement.  Keep all follow-up appointments. If you have had an infection before:  Increase the fiber in your diet as directed by your health care provider or dietitian.  Take a dietary fiber supplement if your health care provider approves.  Only take medicines as directed by your health care provider. SEEK MEDICAL CARE IF:   You have abdominal pain.  You have bloating.  You have cramps.  You have not gone to the bathroom in 3 days. SEEK IMMEDIATE MEDICAL CARE IF:   Your pain gets worse.  Yourbloating becomes very bad.  You have a fever or chills, and your symptoms suddenly get worse.  You begin vomiting.  You have bowel movements that are bloody or black. MAKE SURE YOU:  Understand these instructions.  Will watch your condition.  Will get help right away if you are not doing well or get worse.   This information is not intended to replace advice given to you by your health care provider. Make sure you discuss any questions  you have with your health care provider.   Document Released: 12/19/2003 Document Revised: 03/28/2013 Document Reviewed: 02/15/2013 Elsevier Interactive Patient Education 2016 Else[employer]   Colon Polyps Polyps are lumps of extra tissue growing inside the body. Polyps can grow in the large intestine (colon). Most colon polyps are noncancerous (benign). However, some colon polyps can become cancerous over time. Polyps that are larger than a pea may be harmful. To be safe, caregivers remove and test all polyps. CAUSES  Polyps form when mutations in the genes cause your cells to grow and divide even though no more tissue is needed. RISK FACTORS There are a number of risk factors that can increase your chances of getting colon polyps. They include:  Being  older than 50 years.  Family history of colon polyps or colon cancer.  Long-term colon diseases, such as colitis or Crohn disease.  Being overweight.  Smoking.  Being inactive.  Drinking too much alcohol. SYMPTOMS  Most small polyps do not cause symptoms. If symptoms are present, they may include:  Blood in the stool. The stool may look dark red or black.  Constipation or diarrhea that lasts longer than 1 week. DIAGNOSIS People often do not know they have polyps until their caregiver finds them during a regular checkup. Your caregiver can use 4 tests to check for polyps:  Digital rectal exam. The caregiver wears gloves and feels inside the rectum. This test would find polyps only in the rectum.  Barium enema. The caregiver puts a liquid called barium into your rectum before taking X-rays of your colon. Barium makes your colon look white. Polyps are dark, so they are easy to see in the X-ray pictures.  Sigmoidoscopy. A thin, flexible tube (sigmoidoscope) is placed into your rectum. The sigmoidoscope has a light and tiny camera in it. The caregiver uses the sigmoidoscope to look at the last third of your colon.  Colonoscopy. This test is like sigmoidoscopy, but the caregiver looks at the entire colon. This is the most common method for finding and removing polyps. TREATMENT  Any polyps will be removed during a sigmoidoscopy or colonoscopy. The polyps are then tested for cancer. PREVENTION  To help lower your risk of getting more colon polyps:  Eat plenty of fruits and vegetables. Avoid eating fatty foods.  Do not smoke.  Avoid drinking alcohol.  Exercise every day.  Lose weight if recommended by your caregiver.  Eat plenty of calcium and folate. Foods that are rich in calcium include milk, cheese, and broccoli. Foods that are rich in folate include chickpeas, kidney beans, and spinach. HOME CARE INSTRUCTIONS Keep all follow-up appointments as directed by your caregiver.  You may need periodic exams to check for polyps. SEEK MEDICAL CARE IF: You notice bleeding during a bowel movement.   This information is not intended to replace advice given to you by your health care provider. Make sure you discuss any questions you have with your health care provider.   Document Released: 12/18/2003 Document Revised: 04/13/2014 Document Reviewed: 06/02/2011 Elsevier Interactive Patient Education [employer].

## 2015-03-04 NOTE — ED Provider Notes (Signed)
CSN: [id number]     Arrival date & time 03/04/15  2114 History  By signing my name below, I, [name], attest that this documentation has been prepared under the direction and in the presence of [name], MD. Electronically Signed: [name], ED Scribe. 03/04/2015. 9:48 PM.    Chief Complaint  Patient presents with  . Rectal Bleeding   Patient is a 64 y.o. female presenting with hematochezia. The history is provided by the patient. No language interpreter was used.  Rectal Bleeding Quality:  Black and tarry Amount:  Moderate Timing:  Rare Progression:  Unable to specify Chronicity:  New Relieved by:  None tried Worsened by:  Nothing tried Ineffective treatments:  None tried Associated symptoms: no abdominal pain, no dizziness and no light-headedness    HPI Comments: [name] is a 64 y.o. female with a PMHX of DM and HTN who presents to the Emergency Department complaining of rectal bleeding onset this evening. Pt states she had a routine colonoscopy done earlier today, where she had one polyp removed. She notes she also had an endoscopy done to widen her esophagus and obtain a biopsy sample form her stomach at that time. She states she later noticed bloody ("tarry-looking") stool with her first BM after the procedures. She reports associated fatigue. She notes she takes 81 mg Aspirin regularly, which she did not take today. She denies taking any blood thinners.  Pt denies LOC, dizziness, lightheadedness, vaginal bleeding, hematuria, and abdominal pain.   Past Medical History  Diagnosis Date  . 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 [name], residual palpitations  . Vocal cord dysfunction   . Tremor, essential     takes Mysoline and Neurontin daily.  . Allergic rhinitis     uses Nasonex daily as needed  . Low sodium syndrome   . Chronic back pain     reason unknown  . Lumbar radiculopathy   . DDD (degenerative  disc disease), lumbar   . Esophageal reflux     takes Zantac daily  . Hypertension     takes Losartan daily  . Anxiety     takes Ativan daily as needed  . Hypothyroidism     takes Synthroid daily  . Depression     takes Zoloft daily as well as Cymbalta  . Diabetes mellitus     takes NOvolin daily  . Asthma     Albuterol inhaler and Neb daily as needed  . History of bronchitis Dec 2014  . Seizures ([place]) 04-05-13    one time ran out of Primidone and had stopped it cold [place]  . Weakness     in left arm  . Joint pain   . Joint swelling     left ankle  . Dysphagia   . Constipation   . Allergy to angiotensin receptor blockers (ARB) 01/22/2014    Angioedema  . 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 Rayann Heman 2007   Past Surgical History  Procedure Laterality Date  . Tonsillectomy    . Right breast cyst      benign  . Left ankle ligament repair      x 2  . Left elbow repair    . Partial hysterectomy    . Cholecystectomy    . Cesarean section    . Ablation for avnrt    . Colonoscopy  01/16/2004    WIO:MBTDHR rectum/colon  . Esophagogastroduodenoscopy (egd)  with esophageal dilation  04/03/2002    RSW:NIOEVOJJ'K ring, otherwise normal esophagus, status post dilation with 54 F/Normal stomach  . Bartholin gland cyst excision      x 2  . Esophagogastroduodenoscopy (egd) with esophageal dilation N/A 11/08/2012    Dr. [name]:schatzki's ring s/p dilation/hiatal hernia  . Subthalamic stimulator insertion Bilateral 12/04/2013    Procedure: Bilateral Deep brain stimulator placement;  Surgeon: [name], MD;  Location: [facility];  Service: Neurosurgery;  Laterality: Bilateral;  Bilateral Deep brain stimulator placement  . Pulse generator implant Bilateral 12/15/2013    Procedure: BILATERAL PULSE GENERATOR IMPLANT;  Surgeon: [name], MD;  Location: [facility];  Service: Neurosurgery;  Laterality: Bilateral;  BILATERAL  . Maximum access (mas)posterior lumbar interbody fusion  (plif) 1 level N/A 04/24/2014    Procedure: Lumbar four-five Maximum access Surgery posterior lumbar interbody fusion;  Surgeon: [name], MD;  Location: [facility];  Service: Neurosurgery;  Laterality: N/A;  Lumbar four-five Maximum access Surgery posterior lumbar interbody fusion   Family History  Problem Relation Age of Onset  . Lung cancer Father     DIED AGE 25 LUNG CA  . Alcohol abuse Father   . Anxiety disorder Father   . Depression Father   . COPD Mother     DIED AGE 63,MRSA,COPD,PNEUMONIA  . Pneumonia Mother     DIED AGE 63,MRSA,COPD,PNEUMONIA  . Supraventricular tachycardia Mother   . Anxiety disorder Mother   . Depression Mother   . Alcohol abuse Mother   . COPD Brother     AGE 3  . Heart disease Sister     DIED AGE 26, SMOKER,?HEART  . Colon cancer Neg Hx    Social History  Substance Use Topics  . Smoking status: Former Smoker -- 0.30 packs/day for 10 years    Types: Cigarettes    Quit date: 04/23/1993  . Smokeless tobacco: Never Used     Comment: quit smoking 25+yrs ago  . Alcohol Use: No     Comment: no alcohol in 73yr   OB History    Gravida Para Term Preterm AB TAB SAB Ectopic Multiple Living   _0 Review of Systems  Gastrointestinal: Positive for blood in stool and hematochezia. Negative for abdominal pain.  Genitourinary: Negative for hematuria and vaginal bleeding.  Neurological: Negative for dizziness, syncope and light-headedness.  All other systems reviewed and are negative.   Allergies  Invokana; Losartan; Metformin and related; Latex; Morphine and related; Other; and Oxycodone-acetaminophen  Home Medications   Prior to Admission medications   Medication Sig Start Date End Date Taking? Authorizing Provider  albuterol (PROVENTIL HFA;VENTOLIN HFA) 108 (90 BASE) MCG/ACT inhaler Inhale 2 puffs into the lungs every 6 (six) hours as needed for wheezing or shortness of breath. 11/26/14  Yes SEzequiel Essex MD  albuterol  (PROVENTIL) (2.5 MG/3ML) 0.083% nebulizer solution Take 3 mLs (2.5 mg total) by nebulization every 6 (six) hours as needed for wheezing or shortness of breath. 11/28/14  Yes CDeneise Lever MD  ASPIRIN LOW DOSE 81 MG EC tablet TAKE 1 TABLET BY MOUTH ONCE DAILY. 01/12/15  Yes TJanith Lima MD  bisacodyl (DULCOLAX) 10 MG suppository Place 1 suppository (10 mg total) rectally as needed for moderate constipation. 02/25/15  Yes TJanith Lima MD  buPROPion (WELLBUTRIN XL) 150 MG 24 hr tablet Take 1 tablet (150 mg total) by mouth daily. 02/22/15  Yes Thomas L  Ronnald Ramp, MD  fesoterodine (TOVIAZ) 8 MG TB24 tablet Take 1 tablet (8 mg total) by mouth daily. 12/31/14  Yes Janith Lima, MD  fexofenadine (ALLEGRA) 60 MG tablet Take 1 tablet (60 mg total) by mouth 2 (two) times daily. 02/08/15  Yes Deneise Lever, MD  Fluticasone Furoate-Vilanterol (BREO ELLIPTA) 100-25 MCG/INH AEPB Inhale 1 puff then rinse mouth, once daily Patient taking differently: Inhale 1 puff into the lungs daily. Inhale 1 puff then rinse mouth, once daily 02/08/15  Yes Clinton D Young, MD  gabapentin (NEURONTIN) 300 MG capsule TAKE 1 CAPSULE BY MOUTH EVERY MORNING AND 2 CAPSULES AT BEDTIME. 01/12/15  Yes Janith Lima, MD  Homeopathic Products (ARNICARE) GEL Apply 1 Act topically daily. 02/13/15  Yes Janith Lima, MD  HYDROcodone-acetaminophen (NORCO) 10-325 MG per tablet Take 1 tablet by mouth every 6 (six) hours as needed for moderate pain. 12/25/14  Yes Janith Lima, MD  Insulin Detemir (LEVEMIR FLEXPEN) 100 UNIT/ML Pen Inject 50 Units into the skin daily at 10 pm. Patient taking differently: Inject 50 Units into the skin. Daily at 8 pm 02/13/15  Yes Janith Lima, MD  Insulin Lispro (HUMALOG KWIKPEN) 200 UNIT/ML SOPN Inject 10 Units into the skin 3 (three) times daily with meals. 02/07/15  Yes Janith Lima, MD  lamoTRIgine (LAMICTAL) 25 MG tablet Take 1 tablet (25 mg total) by mouth daily. 02/22/15  Yes Janith Lima, MD   levothyroxine (SYNTHROID, LEVOTHROID) 75 MCG tablet TAKE (1) TABLET BY MOUTH ONCE DAILY BEFORE BREAKFAST. 01/12/15  Yes Janith Lima, MD  loperamide (IMODIUM) 2 MG capsule Take 1 capsule (2 mg total) by mouth as needed for diarrhea or loose stools. 02/13/15  Yes Janith Lima, MD  metoprolol succinate (TOPROL-XL) 50 MG 24 hr tablet TAKE 1 TABLET BY MOUTH ONCE DAILY. 01/12/15  Yes Janith Lima, MD  midazolam (VERSED) 2 MG/2ML SOLN injection Inject 1-2 mLs (1-2 mg total) into the vein every 5 (five) minutes as needed for sedation ((up to 6 mg)). 03/04/15  Yes Daneil Dolin, MD  Multiple Vitamins-Minerals (MULTIVITAMIN PO) Take 1 tablet by mouth daily.   Yes Historical Provider, MD  neomycin-polymyxin-pramoxine (NEOSPORIN PLUS) 1 % cream Apply topically 3 (three) times daily. 02/13/15  Yes Janith Lima, MD  Nutritional Supplements (BOOST DIABETIC) LIQD Take 1 Act by mouth daily. 02/27/15  Yes Janith Lima, MD  Omega-3 Fatty Acids (FISH OIL PO) Take 1 capsule by mouth daily.   Yes Historical Provider, MD  omeprazole (PRILOSEC) 20 MG capsule Take 20 mg by mouth daily.   Yes Historical Provider, MD  ondansetron (ZOFRAN ODT) 8 MG disintegrating tablet Take 1 tablet (8 mg total) by mouth every 8 (eight) hours as needed for nausea or vomiting. 11/24/14  Yes Evalee Jefferson, PA-C  pantoprazole (PROTONIX) 40 MG tablet Take 1 tablet (40 mg total) by mouth daily. 02/27/15  Yes Janith Lima, MD  polyethylene glycol [facility] / GLYCOLAX) packet Take 17 g by mouth daily as needed for mild constipation.   Yes Historical Provider, MD  potassium chloride SA (K-DUR,KLOR-CON) 20 MEQ tablet TAKE 1 TABLET BY MOUTH ONCE DAILY. 01/12/15  Yes Janith Lima, MD  primidone (MYSOLINE) 50 MG tablet TAKE 3 TABLETS (162m) BY MOUTH TWICE DAILY. 01/12/15  Yes TJanith Lima MD  QUEtiapine (SEROQUEL) 50 MG tablet Take 1 tablet (50 mg total) by mouth at bedtime. 02/22/15  Yes TJanith Lima MD  ranitidine (ZANTAC) 150 MG  tablet  Take 150 mg by mouth at bedtime.  02/08/15  Yes Historical Provider, MD  Sanitary Napkins & Tampons (EQ MAXI OVERNIGHT EXTRA HEAVY) PADS Use as needed Patient taking differently: 1 each daily as needed (as directed).  02/27/15  Yes Janith Lima, MD  Shampoos (DRY SHAMPOO) AERO Apply 1 spray topically as needed. Patient taking differently: Apply 1 application topically daily as needed (to cleanse hair/scalp).  02/27/15  Yes Janith Lima, MD  sodium chloride (OCEAN) 0.65 % SOLN nasal spray 1-2 sprays each nostril up to 4 times daily as needed Patient taking differently: Place 2 sprays into both nostrils 4 (four) times daily as needed for congestion. 1-2 sprays each nostril up to 4 times daily as needed 02/08/15  Yes Clinton D Young, MD  sucralfate (CARAFATE) 1 G tablet Take 1 tablet (1 g total) by mouth 4 (four) times daily. 02/22/15  Yes Janith Lima, MD  tiZANidine (ZANAFLEX) 4 MG tablet Take 4 mg by mouth at bedtime.    Yes Historical Provider, MD  triamterene-hydrochlorothiazide (MAXZIDE-25) 37.5-25 MG tablet TAKE 1 TABLET BY MOUTH ONCE DAILY. 01/12/15  Yes Janith Lima, MD  cephALEXin (KEFLEX) 500 MG capsule Take 1 capsule (500 mg total) by mouth 4 (four) times daily. Patient not taking: Reported on 03/04/2015 02/20/15   Francine Graven, DO  GAVILYTE-G 236 G solution  02/21/15   Historical Provider, MD  miconazole (MONISTAT 1 COMBINATION PACK) kit Place 1 each vaginally once. Patient not taking: Reported on 03/04/2015 02/16/15   Janith Lima, MD  polyethylene glycol-electrolytes (NULYTELY/GOLYTELY) 420 G solution Take 4,000 mLs by mouth once. Patient not taking: Reported on 03/04/2015 02/21/15   Orvil Feil, NP  Respiratory Therapy Supplies (FLUTTER) DEVI Use as directed 01/18/15   Tanda Rockers, MD   BP 122/69 mmHg  Pulse 94  Temp(Src) 98.7 F (37.1 C) (Oral)  Resp 16  Ht 5' 4.5" (1.638 m)  Wt 172 lb (78.019 kg)  BMI 29.08 kg/m2  SpO2 93% Physical Exam CONSTITUTIONAL: Well  developed/well nourished HEAD: Normocephalic/atraumatic EYES: EOMI/PERRL ENMT: Mucous membranes moist NECK: supple no meningeal signs CV: S1/S2 noted, no murmurs/rubs/gallops noted LUNGS: Lungs are clear to auscultation bilaterally, no apparent distress ABDOMEN: soft, nontender, no rebound or guarding, bowel sounds noted throughout abdomen  RECTAL: no blood or melena noted, no abscess noted, female chaperone present Hemoccult positive per nursing NEURO: Pt is awake/alert/appropriate, moves all extremitiesx4.  No facial droop.   EXTREMITIES: pulses normal/equal, full ROM SKIN: warm, color normal PSYCH: no abnormalities of mood noted, alert and oriented to situation  ED Course  Procedures  DIAGNOSTIC STUDIES: Oxygen Saturation is 99% on RA, normal by my interpretation.    COORDINATION OF CARE: 9:40 PM - Performed rectal exam. Discussed plans to wait on diagnostic studies. Will consult with pt's doctor about results. Pt advised of plan for treatment and pt agrees. 10:40 PM  EGD from today shows schatzki ring, erosions Colonoscopy - hemorrhoids, diverticulosis, polyp that was removed 10:41 PM D/w dr Laural Golden We discussed recent EGD/colonoscopy We discussed labs We discussed patient exam, including no gross blood/melena per rectum Pt stable in the ED He will relay message to dr Gala Romney for f/u tomorrow Currently pt does not appear to have acute GI bleed necessitating admission She is not on anticoagulants No focal abd tenderness Advised to call GI tomorrow We discussed strict return precautions  Labs Review Labs Reviewed  BASIC METABOLIC PANEL - Abnormal; Notable for the following:  Sodium 132 (*)    Potassium 3.0 (*)    Chloride 95 (*)    Glucose, Bld 202 (*)    Calcium 8.6 (*)    All other components within normal limits  CBC WITH DIFFERENTIAL/PLATELET - Abnormal; Notable for the following:    HCT 34.8 (*)    All other components within normal limits  POC OCCULT BLOOD,  ED  TYPE AND SCREEN    I have personally reviewed and evaluated these  lab results as part of my medical decision-making.   Medications  potassium chloride SA (K-DUR,KLOR-CON) CR tablet 40 mEq (not administered)    MDM   Final diagnoses:  Rectal bleeding    Nursing notes including past medical history and social history reviewed and considered in documentation Labs/vital reviewed myself and considered during evaluation Previous records reviewed and considered    I personally performed the services described in this documentation, which was scribed in my presence. The recorded information has been reviewed and is accurate.      [name], MD 03/04/15 [phone]

## 2015-03-04 NOTE — Anesthesia Preprocedure Evaluation (Signed)
Anesthesia Evaluation  Patient identified by MRN, date of birth, ID band Patient awake    Reviewed: Allergy & Precautions, NPO status , Patient's Chart, lab work & pertinent test results  History of Anesthesia Complications Negative for: history of anesthetic complications  Airway Mallampati: II   Neck ROM: Full    Dental  (+) Teeth Intact, Caps, Dental Advidsory Given   Pulmonary asthma , sleep apnea , former smoker,    breath sounds clear to auscultation       Cardiovascular hypertension, Pt. on medications + dysrhythmias Supra Ventricular Tachycardia  Rhythm:Regular Rate:Normal     Neuro/Psych Seizures -,  PSYCHIATRIC DISORDERS Anxiety Depression  Neuromuscular disease    GI/Hepatic GERD  ,  Endo/Other  diabetesHypothyroidism Morbid obesity  Renal/GU      Musculoskeletal  (+) Arthritis ,   Abdominal (+) + obese,   Peds  Hematology   Anesthesia Other Findings   Reproductive/Obstetrics                             Anesthesia Physical Anesthesia Plan  ASA: III  Anesthesia Plan: MAC   Post-op Pain Management:    Induction: Intravenous  Airway Management Planned: Simple Face Mask  Additional Equipment:   Intra-op Plan:   Post-operative Plan:   Informed Consent: I have reviewed the patients History and Physical, chart, labs and discussed the procedure including the risks, benefits and alternatives for the proposed anesthesia with the patient or authorized representative who has indicated his/her understanding and acceptance.     Plan Discussed with:   Anesthesia Plan Comments:         Anesthesia Quick Evaluation

## 2015-03-04 NOTE — H&P (View-Only) (Signed)
Referring Provider: Janith Lima, MD Primary Care Physician:  Scarlette Calico, MD  Primary GI: Dr. Gala Romney   Chief Complaint  Patient presents with  . Gastroesophageal Reflux    HPI:   [name] is a 64 y.o. female presenting today with a history of GERD and constipation, presenting with reflux concerns. Actually due for routine screening colonoscopy now. Last EGD in 2014 with [name]'s ring s/p dilation.    Intermittent constipation but takes Miralax, which works for her. If eats oatmeal, she doesn't need Miralax. Stays at [facility] in [place]. In interim from last visit, has had deep brain stimulation for essential tremors, which has been successful. However, it affected her balance.   Reflux not controlled well. Was prescribed Protonix for "next cycle of drugs". On Prilosec and Zantac. Trying to watch spicy foods. Feels like esophagus has spasms a few times but not recently. Has noted some vague esophageal dysphagia recently. States last week she was hurting by her "xiphoid process". Vague epigastric discomfort. No pain with eating unless eating the greens at the home with vinegar. No N/V.   States she has a cluster of hemorrhoids. No rectal bleeding.   Past Medical History  Diagnosis Date  . 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 [name], residual palpitations  . Vocal cord dysfunction   . Tremor, essential     takes Mysoline and Neurontin daily.  . Allergic rhinitis     uses Nasonex daily as needed  . Low sodium syndrome   . Chronic back pain     reason unknown  . Lumbar radiculopathy   . DDD (degenerative disc disease), lumbar   . Esophageal reflux     takes Zantac daily  . Hypertension     takes Losartan daily  . Anxiety     takes Ativan daily as needed  . Hypothyroidism     takes Synthroid daily  . Depression     takes Zoloft daily as well as Cymbalta  . Diabetes mellitus     takes NOvolin daily  . Asthma     Albuterol inhaler and Neb daily as needed  . History of bronchitis Dec 2014  . Seizures ([place]) 04-05-13    one time ran out of Primidone and had stopped it cold [place]  . Weakness     in left arm  . Joint pain   . Joint swelling     left ankle  . Dysphagia   . Constipation   . Allergy to angiotensin receptor blockers (ARB) 01/22/2014    Angioedema  . 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 Rayann Heman 2007    Past Surgical History  Procedure Laterality Date  . Tonsillectomy    . Right breast cyst      benign  . Left ankle ligament repair      x 2  . Left elbow repair    . Partial hysterectomy    . Cholecystectomy    . Cesarean section    . Ablation for avnrt    . Colonoscopy  01/16/2004    YCX:[id number] rectum/colon  . Esophagogastroduodenoscopy (egd) with esophageal dilation  04/03/2002    UDJ:[id number] ring, otherwise normal esophagus, status post dilation with 54 F/Normal stomach  . Bartholin gland cyst excision      x 2  . Esophagogastroduodenoscopy (egd) with esophageal dilation N/A 11/08/2012    Dr. [name]:schatzki's ring s/p dilation/hiatal hernia  .  Subthalamic stimulator insertion Bilateral 12/04/2013    Procedure: Bilateral Deep brain stimulator placement;  Surgeon: [name], MD;  Location: [facility];  Service: Neurosurgery;  Laterality: Bilateral;  Bilateral Deep brain stimulator placement  . Pulse generator implant Bilateral 12/15/2013    Procedure: BILATERAL PULSE GENERATOR IMPLANT;  Surgeon: [name], MD;  Location: [facility];  Service: Neurosurgery;  Laterality: Bilateral;  BILATERAL  . Maximum access (mas)posterior lumbar interbody fusion (plif) 1 level N/A 04/24/2014    Procedure: Lumbar four-five Maximum access Surgery posterior lumbar interbody fusion;  Surgeon: [name], MD;  Location: [facility];  Service: Neurosurgery;  Laterality: N/A;  Lumbar four-five Maximum access Surgery posterior lumbar interbody fusion    Current Outpatient  Prescriptions  Medication Sig Dispense Refill  . albuterol (PROVENTIL HFA;VENTOLIN HFA) 108 (90 BASE) MCG/ACT inhaler Inhale 2 puffs into the lungs every 6 (six) hours as needed for wheezing or shortness of breath. 1 Inhaler 2  . albuterol (PROVENTIL) (2.5 MG/3ML) 0.083% nebulizer solution Take 3 mLs (2.5 mg total) by nebulization every 6 (six) hours as needed for wheezing or shortness of breath. 75 mL 12  . ASPIRIN LOW DOSE 81 MG EC tablet TAKE 1 TABLET BY MOUTH ONCE DAILY. 30 tablet 11  . Blood Glucose Monitoring Suppl (ONE TOUCH ULTRA 2) W/DEVICE KIT Use to check blood sugars daily Dx e11.9 1 each 0  . buPROPion (WELLBUTRIN XL) 150 MG 24 hr tablet Take 150 mg by mouth daily.    . cephALEXin (KEFLEX) 500 MG capsule Take 1 capsule (500 mg total) by mouth 4 (four) times daily. 40 capsule 0  . fesoterodine (TOVIAZ) 8 MG TB24 tablet Take 1 tablet (8 mg total) by mouth daily. 30 tablet 11  . fexofenadine (ALLEGRA) 60 MG tablet Take 1 tablet (60 mg total) by mouth 2 (two) times daily. 60 tablet 11  . Fluticasone Furoate-Vilanterol (BREO ELLIPTA) 100-25 MCG/INH AEPB Inhale 1 puff then rinse mouth, once daily 60 each 11  . gabapentin (NEURONTIN) 300 MG capsule TAKE 1 CAPSULE BY MOUTH EVERY MORNING AND 2 CAPSULES AT BEDTIME. 90 capsule 11  . glucose blood (ONE TOUCH TEST STRIPS) test strip 1 each by Other route 2 (two) times daily. Use to check blood sugars twice a day Dx E11.9 100 each 3  . glucose blood test strip Use TID  E11.9 100 each 12  . glucose blood test strip Use TID E11.9 100 each 12  . Homeopathic Products (ARNICARE) GEL Apply 1 Act topically daily. 75 g 11  . HYDROcodone-acetaminophen (NORCO) 10-325 MG per tablet Take 1 tablet by mouth every 6 (six) hours as needed for moderate pain. 75 tablet 0  . Insulin Detemir (LEVEMIR FLEXPEN) 100 UNIT/ML Pen Inject 50 Units into the skin daily at 10 pm. (Patient taking differently: Inject 40 Units into the skin daily at 10 pm. ) 15 mL 11  . Insulin  Lispro (HUMALOG KWIKPEN) 200 UNIT/ML SOPN Inject 10 Units into the skin 3 (three) times daily with meals. 9 mL 3  . Insulin Pen Needle (NOVOFINE) 30G X 8 MM MISC Use needle to inject insulin into the skin as directed. 200 each 2  . lamoTRIgine (LAMICTAL) 25 MG tablet Take 25 mg by mouth daily.    Marland Kitchen levothyroxine (SYNTHROID, LEVOTHROID) 75 MCG tablet TAKE (1) TABLET BY MOUTH ONCE DAILY BEFORE BREAKFAST. 30 tablet 5  . loperamide (IMODIUM) 2 MG capsule Take 1 capsule (2 mg total) by mouth as needed for diarrhea or loose  stools. 30 capsule 11  . metoprolol succinate (TOPROL-XL) 50 MG 24 hr tablet TAKE 1 TABLET BY MOUTH ONCE DAILY. 30 tablet 11  . miconazole (MONISTAT 1 COMBINATION PACK) kit Place 1 each vaginally once. 1 each 11  . Multiple Vitamins-Minerals (MULTIVITAMIN PO) Take 1 tablet by mouth daily.    . neomycin-polymyxin-pramoxine (NEOSPORIN PLUS) 1 % cream Apply topically 3 (three) times daily. 14.2 g 11  . Nutritional Supplements (BOOST DIABETIC) LIQD Take 1 Act by mouth daily. 237 mL 11  . ondansetron (ZOFRAN ODT) 8 MG disintegrating tablet Take 1 tablet (8 mg total) by mouth every 8 (eight) hours as needed for nausea or vomiting. 20 tablet 0  . ONE TOUCH LANCETS MISC Use to help check blood sugars twice a day Dx E11.9 200 each 0  . pantoprazole (PROTONIX) 40 MG tablet Take 40 mg by mouth daily.     . polyethylene glycol (MIRALAX / GLYCOLAX) packet Take 17 g by mouth daily as needed for mild constipation.    . potassium chloride SA (K-DUR,KLOR-CON) 20 MEQ tablet TAKE 1 TABLET BY MOUTH ONCE DAILY. 30 tablet 11  . primidone (MYSOLINE) 50 MG tablet TAKE 3 TABLETS (150mg) BY MOUTH TWICE DAILY. 180 tablet 5  . QUEtiapine (SEROQUEL) 50 MG tablet Take 50 mg by mouth at bedtime.    . ranitidine (ZANTAC) 150 MG tablet Take 150 mg by mouth 2 (two) times daily.    . Respiratory Therapy Supplies (FLUTTER) DEVI Use as directed 1 each 0  . sodium chloride (OCEAN) 0.65 % SOLN nasal spray 1-2 sprays each  nostril up to 4 times daily as needed 30 mL prn  . sucralfate (CARAFATE) 1 G tablet Take 1 g by mouth 4 (four) times daily.    . tiZANidine (ZANAFLEX) 4 MG tablet Take 4 mg by mouth at bedtime.     . triamterene-hydrochlorothiazide (MAXZIDE-25) 37.5-25 MG tablet TAKE 1 TABLET BY MOUTH ONCE DAILY. 30 tablet 11   No current facility-administered medications for this visit.    Allergies as of 02/21/2015 - Review Complete 02/20/2015  Allergen Reaction Noted  . Invokana [canagliflozin] Other (See Comments) 12/05/2014  . Losartan  01/22/2014  . Metformin and related Diarrhea 01/18/2013  . Latex Other (See Comments) 07/02/2014  . Morphine and related Other (See Comments) 04/11/2013  . Other Other (See Comments) 07/02/2014  . Oxycodone-acetaminophen Itching 05/11/2011    Family History  Problem Relation Age of Onset  . Lung cancer Father     DIED AGE 58 LUNG CA  . Alcohol abuse Father   . Anxiety disorder Father   . Depression Father   . COPD Mother     DIED AGE 88,MRSA,COPD,PNEUMONIA  . Pneumonia Mother     DIED AGE 88,MRSA,COPD,PNEUMONIA  . Supraventricular tachycardia Mother   . Anxiety disorder Mother   . Depression Mother   . Alcohol abuse Mother   . COPD Brother     AGE 60  . Heart disease Sister     DIED AGE 62, SMOKER,?HEART  . Colon cancer Neg Hx     Social History   Social History  . Marital Status: Married    Spouse Name: N/A  . Number of Children: N/A  . Years of Education: N/A   Occupational History  . retired    Social History Main Topics  . Smoking status: Former Smoker -- 0.30 packs/day for 10 years    Types: Cigarettes    Quit date: 04/23/1993  . Smokeless tobacco: Never Used       Comment: quit smoking 25+yrs ago  . Alcohol Use: No     Comment: no alcohol in 30yrs  . Drug Use: No  . Sexual Activity: No   Other Topics Concern  . None   Social History Narrative   Married, 1 daughte, living.  Lives with husband in [place], [place].   1 child died brain  tumor at age 7.   Two grandchildren.   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   Retired 2011.   No tobacco.   Alcohol: none in 30 yrs.  Distant history of heavy use.   No drug use.    Review of Systems: Gen: see HPI  CV: Denies chest pain, palpitations, syncope, peripheral edema, and claudication. Resp: Denies dyspnea at rest, cough, wheezing, coughing up blood, and pleurisy. GI: see HPI  Derm: Denies rash, itching, dry skin Psych: Denies depression, anxiety, memory loss, confusion. No homicidal or suicidal ideation.  Heme: Denies bruising, bleeding, and enlarged lymph nodes.  Physical Exam: BP 134/85 mmHg  Pulse 93  Temp(Src) 98 F (36.7 C) (Oral)  Ht 5' 4.5" (1.638 m)  Wt 177 lb 3.2 oz (80.377 kg)  BMI 29.96 kg/m2 General:   Alert and oriented. No distress noted. Pleasant and cooperative.  Head:  Normocephalic and atraumatic. Eyes:  Conjuctiva clear without scleral icterus. Mouth:  Oral mucosa pink and moist. Good dentition. No lesions. Heart:  S1, S2 present without murmurs, rubs, or gallops. Regular rate and rhythm. Abdomen:  +BS, soft, non-tender and non-distended. No rebound or guarding. No HSM or masses noted. Msk:  Symmetrical without gross deformities. Normal posture. Extremities:  Left lower extremity in boot  Neurologic:  Alert and  oriented x4;  grossly normal neurologically. Psych:  Alert and cooperative. Normal mood and affect.  

## 2015-03-05 ENCOUNTER — Telehealth: Payer: Self-pay | Admitting: Internal Medicine

## 2015-03-05 ENCOUNTER — Ambulatory Visit: Payer: Commercial Managed Care - HMO | Admitting: Internal Medicine

## 2015-03-05 ENCOUNTER — Telehealth: Payer: Self-pay | Admitting: General Practice

## 2015-03-05 ENCOUNTER — Encounter (HOSPITAL_COMMUNITY): Payer: Self-pay | Admitting: Internal Medicine

## 2015-03-05 ENCOUNTER — Encounter: Payer: Self-pay | Admitting: Internal Medicine

## 2015-03-05 LAB — POC OCCULT BLOOD, ED: Fecal Occult Bld: POSITIVE — AB

## 2015-03-05 MED ORDER — FLUTTER DEVI: Status: DC

## 2015-03-05 NOTE — Telephone Encounter (Signed)
Pt called in and said that we need to [facility] [place] to discontinue her vybrid

## 2015-03-05 NOTE — Telephone Encounter (Signed)
Dr. Annamaria Boots, please specify how often patient is to use Flutter Valve.

## 2015-03-05 NOTE — Telephone Encounter (Signed)
Patient states that nystatin liquid PRN and nystatin cream PRN needs to be sent to [facility] # [phone]

## 2015-03-05 NOTE — Telephone Encounter (Signed)
She should be able to stop Zantac now

## 2015-03-05 NOTE — Telephone Encounter (Signed)
She should be taking Protonix 40 mg daily as previously prescribed. May take Carafate along with Protonix. Would only use Zantac for any indigestion or reflux not controlled by the first 2 medications.

## 2015-03-05 NOTE — Addendum Note (Signed)
Addended by: Janith Lima on: 03/05/2015 04:56 PM   Modules accepted: Miquel Dunn

## 2015-03-05 NOTE — ED Notes (Signed)
Made aware that pt still in Lobby. Attempted to contact [facility] at number that was provided but was hung up on three times. Called facility back a fourth time and someone answered and stated she "didn't know who told me that" and that nobody was able to come after pt. Care person was very agitated on phone and hung up on call. Notified charge RN Joellyn Rued of this.

## 2015-03-05 NOTE — Telephone Encounter (Signed)
I appreciate the feedback. She should be fine.

## 2015-03-05 NOTE — Telephone Encounter (Signed)
  Spoke with pt and she denies any pain. States it is just a slight discomfort in the lower part of her stomach. Pt denies anymore bleeding and has been eating fine. Denies any fever.     [phone]

## 2015-03-05 NOTE — Telephone Encounter (Signed)
Patient called in stating she had bright red blood in her stool this morning and her stool was also tarry looking.  She also wanted to know if it's okay to take the generic form of Zantac, Sucralate along with her Protonix?  Patient denies having any nausea, vomiting or fever.  Please advise?

## 2015-03-05 NOTE — ED Notes (Signed)
Pt Advocate Melynda Ripple stated that she had spoken to someone at [facility] and that they were sending someone to pick up pt at 11:00. Pt discharged to [facility].

## 2015-03-05 NOTE — Telephone Encounter (Signed)
Discussed  with Dr. Laural Golden. We will check on her this morning. Had a cold snare polypectomy left colon. This should be a self limiting phenomenon.

## 2015-03-05 NOTE — Telephone Encounter (Signed)
Pt called back requesting Dr. Gala Romney tell her about her medication

## 2015-03-05 NOTE — Telephone Encounter (Signed)
Patient Returned call ° ° °

## 2015-03-05 NOTE — Telephone Encounter (Signed)
Routing to RMR. 

## 2015-03-05 NOTE — Telephone Encounter (Signed)
Pt cb, want [place] to fax to [facility] pharm in [place]

## 2015-03-05 NOTE — Telephone Encounter (Signed)
Called pt and informed of CY rec Pt voiced understanding  Nothing further is needed at this time.

## 2015-03-05 NOTE — Telephone Encounter (Signed)
Called and spoke to pt. Pt is requesting a rx be faxed to her facility stating how many times she needs to use the flutter valve. The rx will need to be faxed to [phone], [name].  Dr. Annamaria Boots please advise how many times pt needs to use flutter valve. Thanks.

## 2015-03-05 NOTE — Telephone Encounter (Signed)
Patient is currently taking Zantac, Protonix and Carafate.  She wants to know if she needs to continue taking the Zantac now that she is on the Protonix?  CY - please advise.  Current Outpatient Prescriptions on File Prior to Visit  Medication Sig Dispense Refill  . albuterol (PROVENTIL HFA;VENTOLIN HFA) 108 (90 BASE) MCG/ACT inhaler Inhale 2 puffs into the lungs every 6 (six) hours as needed for wheezing or shortness of breath. 1 Inhaler 2  . albuterol (PROVENTIL) (2.5 MG/3ML) 0.083% nebulizer solution Take 3 mLs (2.5 mg total) by nebulization every 6 (six) hours as needed for wheezing or shortness of breath. 75 mL 12  . ASPIRIN LOW DOSE 81 MG EC tablet TAKE 1 TABLET BY MOUTH ONCE DAILY. 30 tablet 11  . bisacodyl (DULCOLAX) 10 MG suppository Place 1 suppository (10 mg total) rectally as needed for moderate constipation. 12 suppository 0  . buPROPion (WELLBUTRIN XL) 150 MG 24 hr tablet Take 1 tablet (150 mg total) by mouth daily. 30 tablet 1  . cephALEXin (KEFLEX) 500 MG capsule Take 1 capsule (500 mg total) by mouth 4 (four) times daily. (Patient not taking: Reported on 03/04/2015) 40 capsule 0  . fesoterodine (TOVIAZ) 8 MG TB24 tablet Take 1 tablet (8 mg total) by mouth daily. 30 tablet 11  . fexofenadine (ALLEGRA) 60 MG tablet Take 1 tablet (60 mg total) by mouth 2 (two) times daily. 60 tablet 11  . Fluticasone Furoate-Vilanterol (BREO ELLIPTA) 100-25 MCG/INH AEPB Inhale 1 puff then rinse mouth, once daily (Patient taking differently: Inhale 1 puff into the lungs daily. Inhale 1 puff then rinse mouth, once daily) 60 each 11  . gabapentin (NEURONTIN) 300 MG capsule TAKE 1 CAPSULE BY MOUTH EVERY MORNING AND 2 CAPSULES AT BEDTIME. 90 capsule 11  . GAVILYTE-G 236 G solution     . Homeopathic Products (ARNICARE) GEL Apply 1 Act topically daily. 75 g 11  . HYDROcodone-acetaminophen (NORCO) 10-325 MG per tablet Take 1 tablet by mouth every 6 (six) hours as needed for moderate pain. 75 tablet 0  .  Insulin Detemir (LEVEMIR FLEXPEN) 100 UNIT/ML Pen Inject 50 Units into the skin daily at 10 pm. (Patient taking differently: Inject 50 Units into the skin. Daily at 8 pm) 15 mL 11  . Insulin Lispro (HUMALOG KWIKPEN) 200 UNIT/ML SOPN Inject 10 Units into the skin 3 (three) times daily with meals. 9 mL 3  . lamoTRIgine (LAMICTAL) 25 MG tablet Take 1 tablet (25 mg total) by mouth daily. 30 tablet 1  . levothyroxine (SYNTHROID, LEVOTHROID) 75 MCG tablet TAKE (1) TABLET BY MOUTH ONCE DAILY BEFORE BREAKFAST. 30 tablet 5  . loperamide (IMODIUM) 2 MG capsule Take 1 capsule (2 mg total) by mouth as needed for diarrhea or loose stools. 30 capsule 11  . metoprolol succinate (TOPROL-XL) 50 MG 24 hr tablet TAKE 1 TABLET BY MOUTH ONCE DAILY. 30 tablet 11  . miconazole (MONISTAT 1 COMBINATION PACK) kit Place 1 each vaginally once. (Patient not taking: Reported on 03/04/2015) 1 each 11  . midazolam (VERSED) 2 MG/2ML SOLN injection Inject 1-2 mLs (1-2 mg total) into the vein every 5 (five) minutes as needed for sedation ((up to 6 mg)). 1 mL 0  . Multiple Vitamins-Minerals (MULTIVITAMIN PO) Take 1 tablet by mouth daily.    Marland Kitchen neomycin-polymyxin-pramoxine (NEOSPORIN PLUS) 1 % cream Apply topically 3 (three) times daily. 14.2 g 11  . Nutritional Supplements (BOOST DIABETIC) LIQD Take 1 Act by mouth daily. 237 mL 11  . Omega-3  Fatty Acids (FISH OIL PO) Take 1 capsule by mouth daily.    . omeprazole (PRILOSEC) 20 MG capsule Take 20 mg by mouth daily.    . ondansetron (ZOFRAN ODT) 8 MG disintegrating tablet Take 1 tablet (8 mg total) by mouth every 8 (eight) hours as needed for nausea or vomiting. 20 tablet 0  . pantoprazole (PROTONIX) 40 MG tablet Take 1 tablet (40 mg total) by mouth daily. 90 tablet 0  . polyethylene glycol (MIRALAX / GLYCOLAX) packet Take 17 g by mouth daily as needed for mild constipation.    . polyethylene glycol-electrolytes (NULYTELY/GOLYTELY) 420 G solution Take 4,000 mLs by mouth once. (Patient not  taking: Reported on 03/04/2015) 4000 mL 0  . potassium chloride SA (K-DUR,KLOR-CON) 20 MEQ tablet TAKE 1 TABLET BY MOUTH ONCE DAILY. 30 tablet 11  . primidone (MYSOLINE) 50 MG tablet TAKE 3 TABLETS (150mg) BY MOUTH TWICE DAILY. 180 tablet 5  . QUEtiapine (SEROQUEL) 50 MG tablet Take 1 tablet (50 mg total) by mouth at bedtime. 30 tablet 1  . ranitidine (ZANTAC) 150 MG tablet Take 150 mg by mouth at bedtime.     . Respiratory Therapy Supplies (FLUTTER) DEVI Use as directed 1 each 0  . Sanitary Napkins & Tampons (EQ MAXI OVERNIGHT EXTRA HEAVY) PADS Use as needed (Patient taking differently: 1 each daily as needed (as directed). ) 20 each 5  . Shampoos (DRY SHAMPOO) AERO Apply 1 spray topically as needed. (Patient taking differently: Apply 1 application topically daily as needed (to cleanse hair/scalp). ) 142 g 4  . sodium chloride (OCEAN) 0.65 % SOLN nasal spray 1-2 sprays each nostril up to 4 times daily as needed (Patient taking differently: Place 2 sprays into both nostrils 4 (four) times daily as needed for congestion. 1-2 sprays each nostril up to 4 times daily as needed) 30 mL prn  . sucralfate (CARAFATE) 1 G tablet Take 1 tablet (1 g total) by mouth 4 (four) times daily. 120 tablet 1  . tiZANidine (ZANAFLEX) 4 MG tablet Take 4 mg by mouth at bedtime.     . triamterene-hydrochlorothiazide (MAXZIDE-25) 37.5-25 MG tablet TAKE 1 TABLET BY MOUTH ONCE DAILY. 30 tablet 11   No current facility-administered medications on file prior to visit.   Allergies  Allergen Reactions  . Invokana [Canagliflozin] Other (See Comments)    Yeast infectios  . Losartan     Angioedema   . Metformin And Related Diarrhea  . Latex Other (See Comments)    Other Reaction: redness, blisters  . Morphine And Related Other (See Comments)    Pt. States med makes her crazy  . Other Other (See Comments)    Other Reaction: redness, blisters  . Oxycodone-Acetaminophen Itching    TYLOX. Caused internal itching per patient      

## 2015-03-05 NOTE — Telephone Encounter (Signed)
Per Dr. Annamaria Boots:  Flutter valve to help clear airway mucus   Blow through 4 times per set and repeat 3 sets per day while needed for chest congestion  --------- [facility] request.  Attempted to contact patient, no answer, will call back.

## 2015-03-05 NOTE — ED Notes (Signed)
[facility]ed [phone] and spoke with Daphyne who proceeds to tell me that I have to quit calling her and waking her up. She then gave me the owners number [phone] which went straight to voice mail.

## 2015-03-05 NOTE — Telephone Encounter (Signed)
Order with instructions sent to [facility] per patient's request. Nothing further needed. Closing encounter

## 2015-03-05 NOTE — Telephone Encounter (Signed)
Flutter valve to help clear airway mucus                 Blow through 4 times per set and repeat 3 sets per day while needed for chest congestion

## 2015-03-05 NOTE — Telephone Encounter (Signed)
Patient requesting that instructions for Flutter Device be faxed to [facility] - fax: [phone]  Letter drafted and faxed to [facility] per patient's request. Patient notified of instructions and that fax has been sent.  Nothing further needed. Closing encounter

## 2015-03-06 ENCOUNTER — Ambulatory Visit (INDEPENDENT_AMBULATORY_CARE_PROVIDER_SITE_OTHER): Payer: Commercial Managed Care - HMO | Admitting: Neurology

## 2015-03-06 ENCOUNTER — Telehealth: Payer: Self-pay | Admitting: Internal Medicine

## 2015-03-06 ENCOUNTER — Encounter: Payer: Self-pay | Admitting: Neurology

## 2015-03-06 ENCOUNTER — Telehealth: Payer: Self-pay | Admitting: Neurology

## 2015-03-06 VITALS — BP 110/70 | HR 70 | Ht 64.5 in | Wt 174.0 lb

## 2015-03-06 DIAGNOSIS — R296 Repeated falls: Secondary | ICD-10-CM

## 2015-03-06 DIAGNOSIS — G250 Essential tremor: Secondary | ICD-10-CM | POA: Diagnosis not present

## 2015-03-06 DIAGNOSIS — E1142 Type 2 diabetes mellitus with diabetic polyneuropathy: Secondary | ICD-10-CM

## 2015-03-06 DIAGNOSIS — Z9689 Presence of other specified functional implants: Secondary | ICD-10-CM

## 2015-03-06 MED ORDER — PRIMIDONE 50 MG PO TABS: 100.0000 mg | ORAL_TABLET | Freq: Two times a day (BID) | ORAL | Status: DC

## 2015-03-06 MED ORDER — GABAPENTIN 600 MG PO TABS: 600.0000 mg | ORAL_TABLET | Freq: Two times a day (BID) | ORAL | Status: DC

## 2015-03-06 NOTE — Telephone Encounter (Signed)
Called and spoke to pt. Pt requesting rx stating pt cannot be around any scented sprays, clorox, or disinfecting spray due to her allergies. Pt aware [facility] is not available to answer until 12.1.2016.  Dr. Annamaria Boots please advise. Thanks.

## 2015-03-06 NOTE — Telephone Encounter (Signed)
Called pt home and left message with staff regarding Dr. Gala Romney recommendations

## 2015-03-06 NOTE — Patient Instructions (Addendum)
[facility]:  [place], [place] [address] ([address]  Phone: [phone]   1. Let us know when you get your boot off so we can send an order for physical therapy. 2. Decrease Primidone to 100 mg twice daily (2 tablets). 3. Change Gabapentin to 600 mg twice daily.

## 2015-03-06 NOTE — Telephone Encounter (Signed)
Okay but she told me she was going to have the doctor that takes care of her there write it
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
## 2015-03-06 NOTE — Procedures (Signed)

## 2015-03-06 NOTE — Telephone Encounter (Signed)
Patient called to ask if we could write an order that patient needs someone with her while she showers due to multiple falls in the shower and fax this to [facility] at [phone]. Please advise if okay to write.

## 2015-03-07 ENCOUNTER — Telehealth: Payer: Self-pay | Admitting: Internal Medicine

## 2015-03-07 ENCOUNTER — Emergency Department (HOSPITAL_COMMUNITY)
Admission: EM | Admit: 2015-03-07 | Discharge: 2015-03-08 | Disposition: A | Discharge status: Home / Self Care | Payer: Commercial Managed Care - HMO | Attending: Emergency Medicine | Admitting: Emergency Medicine

## 2015-03-07 ENCOUNTER — Telehealth: Payer: Self-pay

## 2015-03-07 ENCOUNTER — Encounter (HOSPITAL_COMMUNITY): Payer: Self-pay | Admitting: Emergency Medicine

## 2015-03-07 ENCOUNTER — Emergency Department (HOSPITAL_COMMUNITY): Payer: Commercial Managed Care - HMO

## 2015-03-07 DIAGNOSIS — E039 Hypothyroidism, unspecified: Secondary | ICD-10-CM | POA: Diagnosis not present

## 2015-03-07 DIAGNOSIS — Z794 Long term (current) use of insulin: Secondary | ICD-10-CM | POA: Insufficient documentation

## 2015-03-07 DIAGNOSIS — Z79899 Other long term (current) drug therapy: Secondary | ICD-10-CM | POA: Insufficient documentation

## 2015-03-07 DIAGNOSIS — F419 Anxiety disorder, unspecified: Secondary | ICD-10-CM | POA: Diagnosis not present

## 2015-03-07 DIAGNOSIS — K219 Gastro-esophageal reflux disease without esophagitis: Secondary | ICD-10-CM | POA: Insufficient documentation

## 2015-03-07 DIAGNOSIS — Z91040 Latex allergy status: Secondary | ICD-10-CM | POA: Diagnosis not present

## 2015-03-07 DIAGNOSIS — F329 Major depressive disorder, single episode, unspecified: Secondary | ICD-10-CM | POA: Diagnosis not present

## 2015-03-07 DIAGNOSIS — J45909 Unspecified asthma, uncomplicated: Secondary | ICD-10-CM | POA: Insufficient documentation

## 2015-03-07 DIAGNOSIS — Z87891 Personal history of nicotine dependence: Secondary | ICD-10-CM | POA: Insufficient documentation

## 2015-03-07 DIAGNOSIS — E119 Type 2 diabetes mellitus without complications: Secondary | ICD-10-CM | POA: Insufficient documentation

## 2015-03-07 DIAGNOSIS — I10 Essential (primary) hypertension: Secondary | ICD-10-CM | POA: Insufficient documentation

## 2015-03-07 DIAGNOSIS — Z8739 Personal history of other diseases of the musculoskeletal system and connective tissue: Secondary | ICD-10-CM | POA: Diagnosis not present

## 2015-03-07 DIAGNOSIS — Z7982 Long term (current) use of aspirin: Secondary | ICD-10-CM | POA: Diagnosis not present

## 2015-03-07 DIAGNOSIS — G8929 Other chronic pain: Secondary | ICD-10-CM | POA: Insufficient documentation

## 2015-03-07 DIAGNOSIS — K5900 Constipation, unspecified: Secondary | ICD-10-CM | POA: Diagnosis present

## 2015-03-07 LAB — URINALYSIS, ROUTINE W REFLEX MICROSCOPIC
Bilirubin Urine: NEGATIVE
Glucose, UA: NEGATIVE mg/dL
Hgb urine dipstick: NEGATIVE
Ketones, ur: NEGATIVE mg/dL
Nitrite: POSITIVE — AB
Protein, ur: NEGATIVE mg/dL
Specific Gravity, Urine: 1.01 (ref 1.005–1.030)
pH: 6 (ref 5.0–8.0)

## 2015-03-07 LAB — URINE MICROSCOPIC-ADD ON

## 2015-03-07 NOTE — ED Notes (Signed)
Patient brought in by EMS from [facility] with complaint of abdominal pain starting today and constipation. States last bowel movement was Sunday.

## 2015-03-07 NOTE — Telephone Encounter (Signed)
Note written and faxed to [facility] with confirmation received.

## 2015-03-07 NOTE — ED Provider Notes (Signed)
CSN: [id number]     Arrival date & time 03/07/15  2107 History  By signing my name below, I, [name], attest that this documentation has been prepared under the direction and in the presence of Rolland Porter, MD at 23:25. Electronically Signed: [name], Scribe. 03/07/2015. 1:07 AM.    Chief Complaint  Patient presents with  . Constipation     The history is provided by the patient. No language interpreter was used.    HPI Comments: [name] is a 64 y.o. female who presents to the Emergency Department complaining of constipation and abdominal pain onset three days ago following her colonoscopy and endoscopy. Pt states there were no findings with these procedures.  Pt reports that she has not had a bowel movement since three days ago. She reports she has been eating normally. Pt denies nausea, vomiting, fever, and trouble urinating. Pt was in the ER two days ago for black tarry stool which she states were only quarter sized areas on blood on her diaper. Pt has had a polyp removed on reviewing her EDG/colonscopy report.  PCP Dr Ronnald Ramp  Past Medical History  Diagnosis Date  . 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 [name], residual palpitations  . Vocal cord dysfunction   . Tremor, essential     takes Mysoline and Neurontin daily.  . Allergic rhinitis     uses Nasonex daily as needed  . Low sodium syndrome   . Chronic back pain     reason unknown  . Lumbar radiculopathy   . DDD (degenerative disc disease), lumbar   . Esophageal reflux     takes Zantac daily  . Hypertension     takes Losartan daily  . Anxiety     takes Ativan daily as needed  . Hypothyroidism     takes Synthroid daily  . Depression     takes Zoloft daily as well as Cymbalta  . Diabetes mellitus     takes NOvolin daily  . Asthma     Albuterol inhaler and Neb daily as needed  . History of bronchitis Dec 2014  . Seizures ([place]) 04-05-13     one time ran out of Primidone and had stopped it cold [place]  . Weakness     in left arm  . Joint pain   . Joint swelling     left ankle  . Dysphagia   . Constipation   . Allergy to angiotensin receptor blockers (ARB) 01/22/2014    Angioedema  . 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 Rayann Heman 2007   Past Surgical History  Procedure Laterality Date  . Tonsillectomy    . Right breast cyst      benign  . Left ankle ligament repair      x 2  . Left elbow repair    . Partial hysterectomy    . Cholecystectomy    . Cesarean section    . Ablation for avnrt    . Colonoscopy  01/16/2004    RDE:YCXKGY rectum/colon  . Esophagogastroduodenoscopy (egd) with esophageal dilation  04/03/2002    JEH:UDJSHFWY'O ring, otherwise normal esophagus, status post dilation with 54 F/Normal stomach  . Bartholin gland cyst excision      x 2  . Esophagogastroduodenoscopy (egd) with esophageal dilation N/A 11/08/2012    Dr. [name]:schatzki's ring s/p dilation/hiatal hernia  . Subthalamic stimulator insertion Bilateral 12/04/2013    Procedure: Bilateral  Deep brain stimulator placement;  Surgeon: [name], MD;  Location: [facility];  Service: Neurosurgery;  Laterality: Bilateral;  Bilateral Deep brain stimulator placement  . Pulse generator implant Bilateral 12/15/2013    Procedure: BILATERAL PULSE GENERATOR IMPLANT;  Surgeon: [name], MD;  Location: [facility];  Service: Neurosurgery;  Laterality: Bilateral;  BILATERAL  . Maximum access (mas)posterior lumbar interbody fusion (plif) 1 level N/A 04/24/2014    Procedure: Lumbar four-five Maximum access Surgery posterior lumbar interbody fusion;  Surgeon: [name], MD;  Location: [facility];  Service: Neurosurgery;  Laterality: N/A;  Lumbar four-five Maximum access Surgery posterior lumbar interbody fusion  . Colonoscopy with propofol N/A 03/04/2015    Procedure: COLONOSCOPY WITH PROPOFOL at cecum at 1011; withdrawal time=37mnutes;  Surgeon:  RDaneil Dolin MD;  Location: [facility];  Service: Endoscopy;  Laterality: N/A;  . Esophagogastroduodenoscopy (egd) with propofol N/A 03/04/2015    Procedure: ESOPHAGOGASTRODUODENOSCOPY (EGD) WITH PROPOFOL Procedure #1;  Surgeon: RDaneil Dolin MD;  Location: [facility];  Service: Endoscopy;  Laterality: N/A;  . Esophageal dilation N/A 03/04/2015    Procedure: ESOPHAGEAL DILATION WITH 54FR MALONEY DILATOR;  Surgeon: RDaneil Dolin MD;  Location: [facility];  Service: Endoscopy;  Laterality: N/A;  . Esophageal biopsy  03/04/2015    Procedure: BIOPSY (Duodenal, Gastric);  Surgeon: RDaneil Dolin MD;  Location: [facility];  Service: Endoscopy;;  . Polypectomy  03/04/2015    Procedure: POLYPECTOMY (descending colon);  Surgeon: RDaneil Dolin MD;  Location: [facility];  Service: Endoscopy;;   Family History  Problem Relation Age of Onset  . Lung cancer Father     DIED AGE [id number] CA  . Alcohol abuse Father   . Anxiety disorder Father   . Depression Father   . COPD Mother     DIED AGE 93,MRSA,COPD,PNEUMONIA  . Pneumonia Mother     DIED AGE 93,MRSA,COPD,PNEUMONIA  . Supraventricular tachycardia Mother   . Anxiety disorder Mother   . Depression Mother   . Alcohol abuse Mother   . COPD Brother     AGE 66 . Heart disease Sister     DIED AGE 66 SMOKER,?HEART  . Colon cancer Neg Hx    Social History  Substance Use Topics  . Smoking status: Former Smoker -- 0.30 packs/day for 10 years    Types: Cigarettes    Quit date: 04/23/1993  . Smokeless tobacco: Never Used     Comment: quit smoking 25+yrs ago  . Alcohol Use: No     Comment: no alcohol in 347yr  Lives in a facility  OB History    Gravida Para Term Preterm AB TAB SAB Ectopic Multiple Living   _0 Review of Systems  Constitutional: Negative for fever.  Gastrointestinal: Positive for abdominal pain and constipation. Negative for nausea and vomiting.  Genitourinary: Negative for difficulty urinating.  All other systems  reviewed and are negative.     Allergies  Invokana; Losartan; Metformin and related; Latex; Morphine and related; Other; and Oxycodone-acetaminophen  Home Medications   Prior to Admission medications   Medication Sig Start Date End Date Taking? Authorizing Provider  albuterol (PROVENTIL HFA;VENTOLIN HFA) 108 (90 BASE) MCG/ACT inhaler Inhale 2 puffs into the lungs every 6 (six) hours as needed for wheezing or shortness of breath. 11/26/14  Yes StEzequiel EssexMD  albuterol (PROVENTIL) (2.5 MG/3ML) 0.083% nebulizer solution Take 3 mLs (2.5 mg  total) by nebulization every 6 (six) hours as needed for wheezing or shortness of breath. 11/28/14  Yes Deneise Lever, MD  ASPIRIN LOW DOSE 81 MG EC tablet TAKE 1 TABLET BY MOUTH ONCE DAILY. 01/12/15  Yes Janith Lima, MD  bisacodyl (DULCOLAX) 10 MG suppository Place 1 suppository (10 mg total) rectally as needed for moderate constipation. 02/25/15  Yes Janith Lima, MD  buPROPion (WELLBUTRIN XL) 150 MG 24 hr tablet Take 1 tablet (150 mg total) by mouth daily. 02/22/15  Yes Janith Lima, MD  fesoterodine (TOVIAZ) 8 MG TB24 tablet Take 1 tablet (8 mg total) by mouth daily. 12/31/14  Yes Janith Lima, MD  fexofenadine (ALLEGRA) 60 MG tablet Take 1 tablet (60 mg total) by mouth 2 (two) times daily. 02/08/15  Yes Deneise Lever, MD  Fluticasone Furoate-Vilanterol (BREO ELLIPTA) 100-25 MCG/INH AEPB Inhale 1 puff then rinse mouth, once daily Patient taking differently: Inhale 1 puff into the lungs daily. Inhale 1 puff then rinse mouth, once daily 02/08/15  Yes Deneise Lever, MD  gabapentin (NEURONTIN) 600 MG tablet Take 1 tablet (600 mg total) by mouth 2 (two) times daily. 03/06/15  Yes Eustace Quail Tat, DO  Homeopathic Products (ARNICARE) GEL Apply 1 Act topically daily. 02/13/15  Yes Janith Lima, MD  HYDROcodone-acetaminophen (NORCO) 10-325 MG per tablet Take 1 tablet by mouth every 6 (six) hours as needed for moderate pain. 12/25/14  Yes Janith Lima,  MD  Insulin Detemir (LEVEMIR FLEXPEN) 100 UNIT/ML Pen Inject 50 Units into the skin daily at 10 pm. Patient taking differently: Inject 50 Units into the skin every evening. Daily at 8 pm 02/13/15  Yes Janith Lima, MD  Insulin Lispro (HUMALOG KWIKPEN) 200 UNIT/ML SOPN Inject 10 Units into the skin 3 (three) times daily with meals. 02/07/15  Yes Janith Lima, MD  lamoTRIgine (LAMICTAL) 25 MG tablet Take 1 tablet (25 mg total) by mouth daily. 02/22/15  Yes Janith Lima, MD  levothyroxine (SYNTHROID, LEVOTHROID) 75 MCG tablet TAKE (1) TABLET BY MOUTH ONCE DAILY BEFORE BREAKFAST. 01/12/15  Yes Janith Lima, MD  loperamide (IMODIUM) 2 MG capsule Take 1 capsule (2 mg total) by mouth as needed for diarrhea or loose stools. 02/13/15  Yes Janith Lima, MD  metoprolol succinate (TOPROL-XL) 50 MG 24 hr tablet TAKE 1 TABLET BY MOUTH ONCE DAILY. 01/12/15  Yes Janith Lima, MD  miconazole (MONISTAT 1 COMBINATION PACK) kit Place 1 each vaginally once. 02/16/15  Yes Janith Lima, MD  midazolam (VERSED) 2 MG/2ML SOLN injection Inject 1-2 mLs (1-2 mg total) into the vein every 5 (five) minutes as needed for sedation ((up to 6 mg)). 03/04/15  Yes Daneil Dolin, MD  Multiple Vitamins-Minerals (MULTIVITAMIN PO) Take 1 tablet by mouth daily.   Yes Historical Provider, MD  neomycin-polymyxin-pramoxine (NEOSPORIN PLUS) 1 % cream Apply topically 3 (three) times daily. 02/13/15  Yes Janith Lima, MD  Nutritional Supplements (BOOST DIABETIC) LIQD Take 1 Act by mouth daily. 02/27/15  Yes Janith Lima, MD  Omega-3 Fatty Acids (FISH OIL PO) Take 1 capsule by mouth daily.   Yes Historical Provider, MD  omeprazole (PRILOSEC) 20 MG capsule Take 20 mg by mouth daily.   Yes Historical Provider, MD  ondansetron (ZOFRAN ODT) 8 MG disintegrating tablet Take 1 tablet (8 mg total) by mouth every 8 (eight) hours as needed for nausea or vomiting. 11/24/14  Yes Evalee Jefferson, PA-C  pantoprazole (PROTONIX) 40  MG tablet Take 1 tablet  (40 mg total) by mouth daily. 02/27/15  Yes Janith Lima, MD  potassium chloride SA (K-DUR,KLOR-CON) 20 MEQ tablet TAKE 1 TABLET BY MOUTH ONCE DAILY. 01/12/15  Yes Janith Lima, MD  primidone (MYSOLINE) 50 MG tablet Take 2 tablets (100 mg total) by mouth 2 (two) times daily. 03/06/15  Yes Rebecca S Tat, DO  QUEtiapine (SEROQUEL) 50 MG tablet Take 1 tablet (50 mg total) by mouth at bedtime. 02/22/15  Yes Janith Lima, MD  ranitidine (ZANTAC) 150 MG tablet Take 150 mg by mouth at bedtime.  02/08/15  Yes Historical Provider, MD  Respiratory Therapy Supplies (FLUTTER) DEVI Blow through 4 times per set and repeat 3 sets per day while needed for chest congestion J45.20 03/05/15  Yes Deneise Lever, MD  sodium chloride (OCEAN) 0.65 % SOLN nasal spray 1-2 sprays each nostril up to 4 times daily as needed Patient taking differently: Place 2 sprays into both nostrils 4 (four) times daily as needed for congestion. 1-2 sprays each nostril up to 4 times daily as needed 02/08/15  Yes Clinton D Young, MD  sucralfate (CARAFATE) 1 G tablet Take 1 tablet (1 g total) by mouth 4 (four) times daily. 02/22/15  Yes Janith Lima, MD  tiZANidine (ZANAFLEX) 4 MG tablet Take 4 mg by mouth at bedtime.    Yes Historical Provider, MD  triamterene-hydrochlorothiazide (MAXZIDE-25) 37.5-25 MG tablet TAKE 1 TABLET BY MOUTH ONCE DAILY. 01/12/15  Yes Janith Lima, MD  polyethylene glycol [facility]) packet Take 17 g by mouth daily. 03/08/15   Rolland Porter, MD  Respiratory Therapy Supplies (FLUTTER) DEVI Use as directed 01/18/15   Tanda Rockers, MD  Sanitary Napkins & Tampons (EQ MAXI OVERNIGHT EXTRA HEAVY) PADS Use as needed Patient taking differently: 1 each daily as needed (as directed).  02/27/15   Janith Lima, MD  Shampoos (DRY SHAMPOO) AERO Apply 1 spray topically as needed. Patient taking differently: Apply 1 application topically daily as needed (to cleanse hair/scalp).  02/27/15   Janith Lima, MD   BP 140/75 mmHg   Pulse 103  Temp(Src) 98.4 F (36.9 C) (Oral)  Resp 20  Ht $R'5\' 4"'AI$  (1.626 m)  Wt 174 lb (78.926 kg)  BMI 29.85 kg/m2  SpO2 98%  Vital signs normal except tachycardia  Physical Exam  Constitutional: She is oriented to person, place, and time. She appears well-developed and well-nourished.  Non-toxic appearance. She does not appear ill. No distress.  Patient keeps falling asleep during her interview.  HENT:  Head: Normocephalic and atraumatic.  Right Ear: External ear normal.  Left Ear: External ear normal.  Nose: Nose normal. No mucosal edema or rhinorrhea.  Mouth/Throat: Oropharynx is clear and moist and mucous membranes are normal. No dental abscesses or uvula swelling.  Eyes: Conjunctivae and EOM are normal. Pupils are equal, round, and reactive to light.  Neck: Normal range of motion and full passive range of motion without pain. Neck supple.  Cardiovascular: Normal rate, regular rhythm and normal heart sounds.  Exam reveals no gallop and no friction rub.   No murmur heard. Pulmonary/Chest: Effort normal and breath sounds normal. No respiratory distress. She has no wheezes. She has no rhonchi. She has no rales. She exhibits no tenderness and no crepitus.  Abdominal: Soft. Normal appearance and bowel sounds are normal. She exhibits no distension. There is no tenderness. There is no rebound and no guarding.  Musculoskeletal: Normal range of motion. She exhibits  no edema or tenderness.  Moves all extremities well.   Neurological: She is alert and oriented to person, place, and time. She has normal strength. No cranial nerve deficit.  Skin: Skin is warm, dry and intact. No rash noted. No erythema. No pallor.  Psychiatric: She has a normal mood and affect. Her speech is normal and behavior is normal. Her mood appears not anxious.  Nursing note and vitals reviewed.   ED Course  Procedures   Medications  magnesium citrate solution 1 Bottle (not administered)  magnesium citrate  solution (296 mLs  Given 03/08/15 0339)    DIAGNOSTIC STUDIES:    Oxygen Saturation is 98% on RA, normal by my interpretation.   COORDINATION OF CARE:  12:12 AM  Discussed treatment plan with pt at bedside and pt agreed to plan.   Patient remained in no distress during her ED visit. She can take MiraLAX as an outpatient. She requested magnesium citrate in the ED which she was given.  Patient is a frequent ED visitor, she's had 17 visits in the past 6 months.   Labs Review Results for orders placed or performed during the hospital encounter of 03/07/15  Urinalysis, Routine w reflex microscopic (not at [facility])  Result Value Ref Range   Color, Urine YELLOW YELLOW   APPearance CLEAR CLEAR   Specific Gravity, Urine 1.010 1.005 - 1.030   pH 6.0 5.0 - 8.0   Glucose, UA NEGATIVE NEGATIVE mg/dL   Hgb urine dipstick NEGATIVE NEGATIVE   Bilirubin Urine NEGATIVE NEGATIVE   Ketones, ur NEGATIVE NEGATIVE mg/dL   Protein, ur NEGATIVE NEGATIVE mg/dL   Nitrite POSITIVE (A) NEGATIVE   Leukocytes, UA SMALL (A) NEGATIVE  Urine microscopic-add on  Result Value Ref Range   Squamous Epithelial / LPF 0-5 (A) NONE SEEN   WBC, UA 6-30 0 - 5 WBC/hpf   RBC / HPF 0-5 0 - 5 RBC/hpf   Bacteria, UA MANY (A) NONE SEEN  Comprehensive metabolic panel  Result Value Ref Range   Sodium 135 135 - 145 mmol/L   Potassium 3.9 3.5 - 5.1 mmol/L   Chloride 102 101 - 111 mmol/L   CO2 27 22 - 32 mmol/L   Glucose, Bld 161 (H) 65 - 99 mg/dL   BUN 12 6 - 20 mg/dL   Creatinine, Ser 0.54 0.44 - 1.00 mg/dL   Calcium 8.8 (L) 8.9 - 10.3 mg/dL   Total Protein 6.6 6.5 - 8.1 g/dL   Albumin 3.7 3.5 - 5.0 g/dL   AST 28 15 - 41 U/L   ALT 43 14 - 54 U/L   Alkaline Phosphatase 90 38 - 126 U/L   Total Bilirubin 0.6 0.3 - 1.2 mg/dL   GFR calc non Af Amer >60 >60 mL/min   GFR calc Af Amer >60 >60 mL/min   Anion gap 6 5 - 15  CBC with Differential  Result Value Ref Range   WBC 8.9 4.0 - 10.5 K/uL   RBC 3.90 3.87 - 5.11 MIL/uL    Hemoglobin 12.1 12.0 - 15.0 g/dL   HCT 35.1 (L) 36.0 - 46.0 %   MCV 90.0 78.0 - 100.0 fL   MCH 31.0 26.0 - 34.0 pg   MCHC 34.5 30.0 - 36.0 g/dL   RDW 12.9 11.5 - 15.5 %   Platelets 177 150 - 400 K/uL   Neutrophils Relative % 60 %   Neutro Abs 5.3 1.7 - 7.7 K/uL   Lymphocytes Relative 32 %   Lymphs Abs 2.9  0.7 - 4.0 K/uL   Monocytes Relative 5 %   Monocytes Absolute 0.5 0.1 - 1.0 K/uL   Eosinophils Relative 3 %   Eosinophils Absolute 0.2 0.0 - 0.7 K/uL   Basophils Relative 0 %   Basophils Absolute 0.0 0.0 - 0.1 K/uL   Laboratory interpretation all normal except possible uti, urine culture sent.    Imaging Review Dg Abd Acute W/chest  03/07/2015  CLINICAL DATA:  Lower abdominal pain and constipation since colonoscopy with polypectomy on 03/04/2015. EXAM: DG ABDOMEN ACUTE W/ 1V CHEST COMPARISON:  02/23/2015 FINDINGS: There is a generous volume colonic stool. There is no bowel obstruction or perforation. No biliary or urinary calculi. Upright view of the chest is negative for significant cardiopulmonary abnormality. Prior cholecystectomy. Prior posterior lumbar decompression with instrumented fusion. IMPRESSION: Negative abdominal radiographs.  No acute cardiopulmonary disease. Electronically Signed   By: Andreas Newport M.D.   On: 03/07/2015 22:16   I have personally reviewed and evaluated these images and lab results as part of my medical decision-making.   EKG Interpretation None      MDM   Final diagnoses:  Constipation, unspecified constipation type    New Prescriptions   POLYETHYLENE GLYCOL (MIRALAX) PACKET    Take 17 g by mouth daily.    Plan discharge   I personally performed the services described in this documentation, which was scribed in my presence. The recorded information has been reviewed and considered.  Rolland Porter, MD, Barbette Or, MD 03/08/15 [phone]

## 2015-03-07 NOTE — Telephone Encounter (Signed)
Called pt and was not able to leave message. 

## 2015-03-07 NOTE — Telephone Encounter (Signed)
Please advise to d/c or continue this med

## 2015-03-07 NOTE — Telephone Encounter (Signed)
Use MiraLAX 17 g orally once to twice daily as needed

## 2015-03-07 NOTE — Telephone Encounter (Signed)
Pt called earlier today (11:00 am) and states she is constipated. She states she has suppositories to use if needed. Wanted to know if she should take one.  I advised her to do so if she felt she needed too.   Pt is calling now stating she is still feeling constipated after using suppository with limited results.  Please advise

## 2015-03-07 NOTE — Telephone Encounter (Signed)
Yes to all these requests

## 2015-03-07 NOTE — Telephone Encounter (Signed)
Needs referral to be sent to [facility].  Patient has appt jan 12th at 10:30 with Dr. Wendie Simmer.  Patient will be having her diabetic eye exam.

## 2015-03-08 ENCOUNTER — Telehealth: Payer: Self-pay | Admitting: *Deleted

## 2015-03-08 ENCOUNTER — Telehealth: Payer: Self-pay | Admitting: Internal Medicine

## 2015-03-08 ENCOUNTER — Encounter: Payer: Self-pay | Admitting: Internal Medicine

## 2015-03-08 DIAGNOSIS — G4733 Obstructive sleep apnea (adult) (pediatric): Secondary | ICD-10-CM

## 2015-03-08 LAB — CBC WITH DIFFERENTIAL/PLATELET
Basophils Absolute: 0 10*3/uL (ref 0.0–0.1)
Basophils Relative: 0 %
Eosinophils Absolute: 0.2 10*3/uL (ref 0.0–0.7)
Eosinophils Relative: 3 %
HCT: 35.1 % — ABNORMAL LOW (ref 36.0–46.0)
Hemoglobin: 12.1 g/dL (ref 12.0–15.0)
Lymphocytes Relative: 32 %
Lymphs Abs: 2.9 10*3/uL (ref 0.7–4.0)
MCH: 31 pg (ref 26.0–34.0)
MCHC: 34.5 g/dL (ref 30.0–36.0)
MCV: 90 fL (ref 78.0–100.0)
Monocytes Absolute: 0.5 10*3/uL (ref 0.1–1.0)
Monocytes Relative: 5 %
Neutro Abs: 5.3 10*3/uL (ref 1.7–7.7)
Neutrophils Relative %: 60 %
Platelets: 177 10*3/uL (ref 150–400)
RBC: 3.9 MIL/uL (ref 3.87–5.11)
RDW: 12.9 % (ref 11.5–15.5)
WBC: 8.9 10*3/uL (ref 4.0–10.5)

## 2015-03-08 LAB — COMPREHENSIVE METABOLIC PANEL
ALT: 43 U/L (ref 14–54)
AST: 28 U/L (ref 15–41)
Albumin: 3.7 g/dL (ref 3.5–5.0)
Alkaline Phosphatase: 90 U/L (ref 38–126)
Anion gap: 6 (ref 5–15)
BUN: 12 mg/dL (ref 6–20)
CO2: 27 mmol/L (ref 22–32)
Calcium: 8.8 mg/dL — ABNORMAL LOW (ref 8.9–10.3)
Chloride: 102 mmol/L (ref 101–111)
Creatinine, Ser: 0.54 mg/dL (ref 0.44–1.00)
GFR calc Af Amer: >60 mL/min (ref >60)
GFR calc non Af Amer: >60 mL/min (ref >60)
Glucose, Bld: 161 mg/dL — ABNORMAL HIGH (ref 65–99)
Potassium: 3.9 mmol/L (ref 3.5–5.1)
Sodium: 135 mmol/L (ref 135–145)
Total Bilirubin: 0.6 mg/dL (ref 0.3–1.2)
Total Protein: 6.6 g/dL (ref 6.5–8.1)

## 2015-03-08 MED ORDER — POLYETHYLENE GLYCOL 3350 17 G PO PACK: 17.0000 g | PACK | Freq: Every day | ORAL | Status: DC

## 2015-03-08 MED ORDER — MAGNESIUM CITRATE PO SOLN: 1.0000 | Freq: Once | ORAL | Status: DC

## 2015-03-08 MED ORDER — MAGNESIUM CITRATE PO SOLN
ORAL | Status: AC
  Administered 2015-03-08: 296 mL
  Filled 2015-03-08: qty 296

## 2015-03-08 NOTE — Telephone Encounter (Signed)
Pt aware that someone will be contacting her to schedule a new appt for sleep study HST order cancelled and Split Night NPSG has been ordered.  Will send to [facility] to ensure this is scheduled and also that the patient is directed to schedule a 2 week f/u after this is scheduled.

## 2015-03-08 NOTE — ED Notes (Signed)
Notifed assisted center of pt discharge, was informed that pt "had to be transported via EMS, because" they have no staff to come pick her up.

## 2015-03-08 NOTE — Telephone Encounter (Signed)
error 

## 2015-03-08 NOTE — Telephone Encounter (Signed)
[name] with [facility] needs to be contacted with these scripts.  Her phone number is [phone] fax [phone] Also, needs ranitidine, vybrid, januvia . . . . Needs to know if this is discontinued or if she is suppose to keep taking.  If she is, script needs to go to pharmacy, [place].  rx care fax [phone]

## 2015-03-08 NOTE — Telephone Encounter (Signed)
splitnight study 06/11/15 Joellen Jersey

## 2015-03-08 NOTE — Telephone Encounter (Signed)
Okay to order split protocol NPSG at [facility], to be read by me, for DX OSA Okay to cancel order for HST, okay to cancel her pending appointment but please reschedule a definite follow-up office appointment with me about 2 weeks after the sleep study is done-check with Joellen Jersey about any questions about scheduling my appointment

## 2015-03-08 NOTE — Telephone Encounter (Signed)
Called and spoke with patient. She states she needs rx sent to pharmacy so that [facility] can give her instructions on the flutter valve. I spoke with Dessie with [facility] and explained to her that the rx was sent to [facility] in [place]. She verified that it was the pharmacy that [facility] uses. Dessie states she will contact pharmacy so that [facility] can receive flutter valve instructions. I explained to her if she had any other questions to call our office back. Both patient and Dessie voiced understanding and had no further questions. Nothing further needed.

## 2015-03-08 NOTE — Telephone Encounter (Signed)
-----   Message from Kara Mead sent at 03/07/2015  9:25 AM EST ----- [name] now has MCD secondary which will not cover a CPAP if pt has the HST originally ordered.  Pt is aware of this and agreeable to have in lab study at [facility].  If you will let Dr. Annamaria Boots know and let me know when the Order has been placed, I will contact Ms. Spaid.  Also do you have a way to cancel the HST order?  If not I can just put a note in the Order that we had to do an in lab d/t pt's ins.   Also, she has an appt on 03/22/15 to f/u with Dr. Annamaria Boots to get HST results.  I am guessing you will want to cancel that and reschedule her after the in lab study?  Just let me know and I will call Ms. Hubbs.  Thanks!!

## 2015-03-08 NOTE — Telephone Encounter (Signed)
Mcarthur Rossetti Josem Kaufmann [id number] valid 03/08/2015 - 09/04/2015 for 6 visits

## 2015-03-08 NOTE — Telephone Encounter (Signed)
Patient's insurance will not cover HST... Okay to do Split Study? Please advise.

## 2015-03-08 NOTE — ED Notes (Signed)
Discharge instructions given, pt demonstrated teach back and verbal understanding. No concerns voiced.  Assisted living center notified.

## 2015-03-08 NOTE — Discharge Instructions (Signed)
Get miralax and put one dose or 17 g in 8 ounces of water,  take 1 dose every 30 minutes for 2-3 hours or until you  get good results and then once or twice daily to prevent constipation.  ° °

## 2015-03-11 ENCOUNTER — Telehealth: Payer: Self-pay | Admitting: Internal Medicine

## 2015-03-11 NOTE — Telephone Encounter (Signed)
Patient called to verify time of her banding on 04/12/2015 and asked if we could mail her a diet sheet for things she can eat and foods to avoid for people with diverticulosis. I told her we would print her one and I would put it in the mail today. JL if you can get this for me, I will mail it this afternoon.

## 2015-03-11 NOTE — Telephone Encounter (Signed)
I see in chart where letter was drawn up on 12/2..  I do not see any documentation as to whether or not patient has received letter. Attempted to contact patient to see if she has received letter.   Phone rang several times, then buzzed like fax machine. Will call back.

## 2015-03-11 NOTE — Telephone Encounter (Signed)
[facility] PT NA. Line rang and then would D/C. Tried 2 more times and same thing happened. WCB

## 2015-03-11 NOTE — Telephone Encounter (Signed)
Routing to refill box for Rantidine

## 2015-03-11 NOTE — Telephone Encounter (Signed)
Have her return a completed hemorrhoid banding questionnaire ahead of time if feasible And sent her info on banding

## 2015-03-11 NOTE — Telephone Encounter (Signed)
Pt Left voicemail requesting a RX for the Ranitidine PRN.

## 2015-03-12 ENCOUNTER — Encounter: Payer: Self-pay | Admitting: Internal Medicine

## 2015-03-12 ENCOUNTER — Encounter (HOSPITAL_COMMUNITY): Payer: Self-pay | Admitting: Internal Medicine

## 2015-03-12 MED ORDER — RANITIDINE HCL 150 MG PO TABS: 150.0000 mg | ORAL_TABLET | Freq: Every day | ORAL | Status: DC

## 2015-03-12 NOTE — Telephone Encounter (Signed)
Pt states that she has not received this letter as of yet, will call us later this week if not received.  Will keep open to follow up

## 2015-03-12 NOTE — Telephone Encounter (Signed)
Done

## 2015-03-12 NOTE — Telephone Encounter (Signed)
Called and spoke with nurse at [facility] - [phone] States that the patient does not need any of these refilled at this time. The patient is not giving herself her own medications nor is she allowed to keep them in her room -- this recently changed. The nurse stated that all her meds are in a bag where they have access to them and she does not need anything at this time. Flutter device was already taken care of and patient given instructions. Nothing further needed.

## 2015-03-12 NOTE — Addendum Note (Signed)
Addended by: Orvil Feil on: 03/12/2015 02:47 PM   Modules accepted: Orders

## 2015-03-14 NOTE — Telephone Encounter (Signed)
Attempted to call pt. Line was busy. Will try back. 

## 2015-03-15 ENCOUNTER — Telehealth: Payer: Self-pay | Admitting: Internal Medicine

## 2015-03-15 ENCOUNTER — Other Ambulatory Visit: Payer: Self-pay | Admitting: Internal Medicine

## 2015-03-15 NOTE — Telephone Encounter (Signed)
Called and spoke with pt Pt states that she needs a letter from [facility] stating that she is able to administer her own OTC meds like her breathing tx and nasal spray Informed pt of conversation that was had on 03-12-2015 with Assisted Living facility stating that she was not allowed to do so based on their assessment   Virl Cagey, CMA at 03/12/2015 12:06 PM     Status: Signed       Expand All Collapse All   Called and spoke with nurse at [place] - [phone] States that the patient does not need any of these refilled at this time. The patient is not giving herself her own medications nor is she allowed to keep them in her room -- this recently changed. The nurse stated that all her meds are in a bag where they have access to them and she does not need anything at this time. Flutter device was already taken care of and patient given instructions. Nothing further needed.         Pt stated that she has been doing her own meds for years and there should be no reason why she can not do them herself  Dr Annamaria Boots, do you want a letter submitted for pt to continue to administer her own OTC med? Please advise

## 2015-03-15 NOTE — Telephone Encounter (Signed)
Spoke with [name]. Gave advisement on Zantac script. Was given sig by NP on 12/6. Was given d/c advisement on vybrid by Dr. Ronnald Ramp on 11/29 will fax letter on this matter. It looks like Januvia was d/c on 11/16. Pt was seen in ED on that day.

## 2015-03-16 ENCOUNTER — Emergency Department (HOSPITAL_COMMUNITY)
Admission: EM | Admit: 2015-03-16 | Discharge: 2015-03-16 | Disposition: A | Discharge status: Home / Self Care | Payer: Commercial Managed Care - HMO | Attending: Emergency Medicine | Admitting: Emergency Medicine

## 2015-03-16 ENCOUNTER — Emergency Department (HOSPITAL_COMMUNITY): Payer: Commercial Managed Care - HMO

## 2015-03-16 ENCOUNTER — Encounter (HOSPITAL_COMMUNITY): Payer: Self-pay | Admitting: Emergency Medicine

## 2015-03-16 DIAGNOSIS — K219 Gastro-esophageal reflux disease without esophagitis: Secondary | ICD-10-CM | POA: Insufficient documentation

## 2015-03-16 DIAGNOSIS — Z7982 Long term (current) use of aspirin: Secondary | ICD-10-CM | POA: Diagnosis not present

## 2015-03-16 DIAGNOSIS — Z8739 Personal history of other diseases of the musculoskeletal system and connective tissue: Secondary | ICD-10-CM | POA: Diagnosis not present

## 2015-03-16 DIAGNOSIS — Z91040 Latex allergy status: Secondary | ICD-10-CM | POA: Insufficient documentation

## 2015-03-16 DIAGNOSIS — K5900 Constipation, unspecified: Secondary | ICD-10-CM | POA: Diagnosis not present

## 2015-03-16 DIAGNOSIS — F419 Anxiety disorder, unspecified: Secondary | ICD-10-CM | POA: Insufficient documentation

## 2015-03-16 DIAGNOSIS — F329 Major depressive disorder, single episode, unspecified: Secondary | ICD-10-CM | POA: Insufficient documentation

## 2015-03-16 DIAGNOSIS — I10 Essential (primary) hypertension: Secondary | ICD-10-CM | POA: Insufficient documentation

## 2015-03-16 DIAGNOSIS — Z79899 Other long term (current) drug therapy: Secondary | ICD-10-CM | POA: Insufficient documentation

## 2015-03-16 DIAGNOSIS — R0602 Shortness of breath: Secondary | ICD-10-CM | POA: Diagnosis present

## 2015-03-16 DIAGNOSIS — E119 Type 2 diabetes mellitus without complications: Secondary | ICD-10-CM | POA: Diagnosis not present

## 2015-03-16 DIAGNOSIS — E039 Hypothyroidism, unspecified: Secondary | ICD-10-CM | POA: Insufficient documentation

## 2015-03-16 DIAGNOSIS — G8929 Other chronic pain: Secondary | ICD-10-CM | POA: Diagnosis not present

## 2015-03-16 DIAGNOSIS — M549 Dorsalgia, unspecified: Secondary | ICD-10-CM | POA: Diagnosis not present

## 2015-03-16 DIAGNOSIS — H538 Other visual disturbances: Secondary | ICD-10-CM | POA: Diagnosis not present

## 2015-03-16 DIAGNOSIS — G250 Essential tremor: Secondary | ICD-10-CM | POA: Insufficient documentation

## 2015-03-16 DIAGNOSIS — Z794 Long term (current) use of insulin: Secondary | ICD-10-CM | POA: Insufficient documentation

## 2015-03-16 DIAGNOSIS — Z87891 Personal history of nicotine dependence: Secondary | ICD-10-CM | POA: Insufficient documentation

## 2015-03-16 DIAGNOSIS — Z7951 Long term (current) use of inhaled steroids: Secondary | ICD-10-CM | POA: Insufficient documentation

## 2015-03-16 DIAGNOSIS — J45901 Unspecified asthma with (acute) exacerbation: Secondary | ICD-10-CM | POA: Diagnosis not present

## 2015-03-16 LAB — CBG MONITORING, ED: Glucose-Capillary: 214 mg/dL — ABNORMAL HIGH (ref 65–99)

## 2015-03-16 MED ORDER — IPRATROPIUM-ALBUTEROL 0.5-2.5 (3) MG/3ML IN SOLN
3.0000 mL | Freq: Once | RESPIRATORY_TRACT | Status: AC
  Administered 2015-03-16: 3 mL via RESPIRATORY_TRACT
  Filled 2015-03-16: qty 3

## 2015-03-16 MED ORDER — PREDNISONE 10 MG PO TABS: 40.0000 mg | ORAL_TABLET | Freq: Every day | ORAL | Status: DC

## 2015-03-16 MED ORDER — PREDNISONE 10 MG PO TABS
60.0000 mg | ORAL_TABLET | Freq: Once | ORAL | Status: AC
  Administered 2015-03-16: 60 mg via ORAL
  Filled 2015-03-16: qty 1

## 2015-03-16 NOTE — Discharge Instructions (Signed)
Asthma, Adult Asthma is a condition of the lungs in which the airways tighten and narrow. Asthma can make it hard to breathe. Asthma cannot be cured, but medicine and lifestyle changes can help control it. Asthma may be started (triggered) by:  Animal skin flakes (dander).  Dust.  Cockroaches.  Pollen.  Mold.  Smoke.  Cleaning products.  Hair sprays or aerosol sprays.  Paint fumes or strong smells.  Cold air, weather changes, and winds.  Crying or laughing hard.  Stress.  Certain medicines or drugs.  Foods, such as dried fruit, potato chips, and sparkling grape juice.  Infections or conditions (colds, flu).  Exercise.  Certain medical conditions or diseases.  Exercise or tiring activities. HOME CARE   Take medicine as told by your doctor.  Use a peak flow meter as told by your doctor. A peak flow meter is a tool that measures how well the lungs are working.  Record and keep track of the peak flow meter's readings.  Understand and use the asthma action plan. An asthma action plan is a written plan for taking care of your asthma and treating your attacks.  To help prevent asthma attacks:  Do not smoke. Stay away from secondhand smoke.  Change your heating and air conditioning filter often.  Limit your use of fireplaces and wood stoves.  Get rid of pests (such as roaches and mice) and their droppings.  Throw away plants if you see mold on them.  Clean your floors. Dust regularly. Use cleaning products that do not smell.  Have someone vacuum when you are not home. Use a vacuum cleaner with a HEPA filter if possible.  Replace carpet with wood, tile, or vinyl flooring. Carpet can trap animal skin flakes and dust.  Use allergy-proof pillows, mattress covers, and box spring covers.  Wash bed sheets and blankets every week in hot water and dry them in a dryer.  Use blankets that are made of polyester or cotton.  Clean bathrooms and kitchens with bleach.  If possible, have someone repaint the walls in these rooms with mold-resistant paint. Keep out of the rooms that are being cleaned and painted.  Wash hands often. GET HELP IF:  You have make a whistling sound when breaking (wheeze), have shortness of breath, or have a cough even if taking medicine to prevent attacks.  The colored mucus you cough up (sputum) is thicker than usual.  The colored mucus you cough up changes from clear or white to yellow, green, gray, or bloody.  You have problems from the medicine you are taking such as:  A rash.  Itching.  Swelling.  Trouble breathing.  You need reliever medicines more than 2-3 times a week.  Your peak flow measurement is still at 50-79% of your personal best after following the action plan for 1 hour.  You have a fever. GET HELP RIGHT AWAY IF:   You seem to be worse and are not responding to medicine during an asthma attack.  You are short of breath even at rest.  You get short of breath when doing very little activity.  You have trouble eating, drinking, or talking.  You have chest pain.  You have a fast heartbeat.  Your lips or fingernails start to turn blue.  You are light-headed, dizzy, or faint.  Your peak flow is less than 50% of your personal best.   This information is not intended to replace advice given to you by your health care provider. Make sure  you discuss any questions you have with your health care provider.     Make an appointment to follow-up with your regular doctor. Continue usual asthma medications. Take prednisone as directed for the next 5 days. Return for any new or worse symptoms.  Chest x-ray was negative.   Document Released: 09/09/2007 Document Revised: 12/12/2014 Document Reviewed: 10/20/2012 Elsevier Interactive Patient Education [employer].

## 2015-03-16 NOTE — ED Provider Notes (Signed)
CSN: [id number]     Arrival date & time 03/16/15  1014 History  By signing my name below, I, [name], attest that this documentation has been prepared under the direction and in the presence of Elizabeth Sorrow, MD. Electronically Signed: Hilda [name], ED Scribe. 03/16/2015. 10:56 AM.    Chief Complaint  Patient presents with  . Shortness of Breath      Patient is a 64 y.o. female presenting with shortness of breath. The history is provided by the patient. No language interpreter was used.  Shortness of Breath Severity:  Moderate Onset quality:  Gradual Duration:  3 days Timing:  Constant Chronicity:  Recurrent Relieved by:  Inhaler Worsened by:  Deep breathing Associated symptoms: cough, headaches and wheezing   Associated symptoms: no abdominal pain, no fever, no rash and no vomiting    HPI Comments: [name] is a 64 y.o. female with a hx of asthma who presents to the Emergency Department complaining of constant, recurrent SOB with associated congestion and cough that has been present for a few days. Pt also reports having central chest pain induced by deep breathing, but reports the wheezing she had this morning has improved after taking doses of her inhaler and nebulizer treatment. Pt reports that her cough has been productive with yellow sputum as well.   Past Medical History  Diagnosis Date  . 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 [name], residual palpitations  . Vocal cord dysfunction   . Tremor, essential     takes Mysoline and Neurontin daily.  . Allergic rhinitis     uses Nasonex daily as needed  . Low sodium syndrome   . Chronic back pain     reason unknown  . Lumbar radiculopathy   . DDD (degenerative disc disease), lumbar   . Esophageal reflux     takes Zantac daily  . Hypertension     takes Losartan daily  . Anxiety     takes Ativan daily as needed  . Hypothyroidism     takes Synthroid daily   . Depression     takes Zoloft daily as well as Cymbalta  . Diabetes mellitus     takes NOvolin daily  . Asthma     Albuterol inhaler and Neb daily as needed  . History of bronchitis Dec 2014  . Seizures ([place]) 04-05-13    one time ran out of Primidone and had stopped it cold [place]  . Weakness     in left arm  . Joint pain   . Joint swelling     left ankle  . Dysphagia   . Constipation   . Allergy to angiotensin receptor blockers (ARB) 01/22/2014    Angioedema  . 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 Rayann Heman 2007   Past Surgical History  Procedure Laterality Date  . Tonsillectomy    . Right breast cyst      benign  . Left ankle ligament repair      x 2  . Left elbow repair    . Partial hysterectomy    . Cholecystectomy    . Cesarean section    . Ablation for avnrt    . Colonoscopy  01/16/2004    WUJ:WJXBJY rectum/colon  . Esophagogastroduodenoscopy (egd) with esophageal dilation  04/03/2002    NWG:NFAOZHYQ'M ring, otherwise normal esophagus, status post dilation with 56 F/Normal stomach  . Bartholin gland cyst excision  x 2  . Esophagogastroduodenoscopy (egd) with esophageal dilation N/A 11/08/2012    Dr. [name]:schatzki's ring s/p dilation/hiatal hernia  . Subthalamic stimulator insertion Bilateral 12/04/2013    Procedure: Bilateral Deep brain stimulator placement;  Surgeon: [name], MD;  Location: [place] NEURO ORS;  Service: Neurosurgery;  Laterality: Bilateral;  Bilateral Deep brain stimulator placement  . Pulse generator implant Bilateral 12/15/2013    Procedure: BILATERAL PULSE GENERATOR IMPLANT;  Surgeon: [name], MD;  Location: [place] NEURO ORS;  Service: Neurosurgery;  Laterality: Bilateral;  BILATERAL  . Maximum access (mas)posterior lumbar interbody fusion (plif) 1 level N/A 04/24/2014    Procedure: Lumbar four-five Maximum access Surgery posterior lumbar interbody fusion;  Surgeon: [name], MD;  Location: [place] NEURO ORS;  Service: Neurosurgery;   Laterality: N/A;  Lumbar four-five Maximum access Surgery posterior lumbar interbody fusion  . Colonoscopy with propofol N/A 03/04/2015    Procedure: COLONOSCOPY WITH PROPOFOL at cecum at 1011; withdrawal time=56mnutes;  Surgeon: RDaneil Dolin MD;  Location: [facility];  Service: Endoscopy;  Laterality: N/A;  . Esophagogastroduodenoscopy (egd) with propofol N/A 03/04/2015    Procedure: ESOPHAGOGASTRODUODENOSCOPY (EGD) WITH PROPOFOL Procedure #1;  Surgeon: RDaneil Dolin MD;  Location: [facility];  Service: Endoscopy;  Laterality: N/A;  . Esophageal dilation N/A 03/04/2015    Procedure: ESOPHAGEAL DILATION WITH 54FR MALONEY DILATOR;  Surgeon: RDaneil Dolin MD;  Location: [facility];  Service: Endoscopy;  Laterality: N/A;  . Esophageal biopsy  03/04/2015    Procedure: BIOPSY (Duodenal, Gastric);  Surgeon: RDaneil Dolin MD;  Location: [facility];  Service: Endoscopy;;  . Polypectomy  03/04/2015    Procedure: POLYPECTOMY (descending colon);  Surgeon: RDaneil Dolin MD;  Location: [facility];  Service: Endoscopy;;   Family History  Problem Relation Age of Onset  . Lung cancer Father     DIED AGE 1813LUNG CA  . Alcohol abuse Father   . Anxiety disorder Father   . Depression Father   . COPD Mother     DIED AGE 49,MRSA,COPD,PNEUMONIA  . Pneumonia Mother     DIED AGE 49,MRSA,COPD,PNEUMONIA  . Supraventricular tachycardia Mother   . Anxiety disorder Mother   . Depression Mother   . Alcohol abuse Mother   . COPD Brother     AGE 10152 . Heart disease Sister     DIED AGE 101550 SMOKER,?HEART  . Colon cancer Neg Hx    Social History  Substance Use Topics  . Smoking status: Former Smoker -- 0.30 packs/day for 10 years    Types: Cigarettes    Quit date: 04/23/1993  . Smokeless tobacco: Never Used     Comment: quit smoking 25+yrs ago  . Alcohol Use: No     Comment: no alcohol in 380yr  OB History    Gravida Para Term Preterm AB TAB SAB Ectopic Multiple Living   _0 Review of Systems   Constitutional: Negative for fever and chills.  HENT: Positive for congestion.   Eyes: Positive for visual disturbance.  Respiratory: Positive for cough, shortness of breath and wheezing.   Cardiovascular: Negative for leg swelling.  Gastrointestinal: Negative for nausea, vomiting, abdominal pain and diarrhea.  Musculoskeletal: Positive for back pain.  Skin: Negative for rash.  Neurological: Positive for headaches.  Hematological: Does not bruise/bleed easily.      Allergies  Invokana; Losartan; Metformin and related; Latex; Morphine and related; Other; and Oxycodone-acetaminophen  Home Medications  Prior to Admission medications   Medication Sig Start Date End Date Taking? Authorizing Provider  albuterol (PROVENTIL HFA;VENTOLIN HFA) 108 (90 BASE) MCG/ACT inhaler Inhale 2 puffs into the lungs every 6 (six) hours as needed for wheezing or shortness of breath. 11/26/14  Yes Ezequiel Essex, MD  albuterol (PROVENTIL) (2.5 MG/3ML) 0.083% nebulizer solution Take 3 mLs (2.5 mg total) by nebulization every 6 (six) hours as needed for wheezing or shortness of breath. 11/28/14  Yes Deneise Lever, MD  ASPIRIN LOW DOSE 81 MG EC tablet TAKE 1 TABLET BY MOUTH ONCE DAILY. 01/12/15  Yes Janith Lima, MD  bisacodyl (DULCOLAX) 10 MG suppository Place 1 suppository (10 mg total) rectally as needed for moderate constipation. 02/25/15  Yes Janith Lima, MD  buPROPion (WELLBUTRIN XL) 150 MG 24 hr tablet Take 1 tablet (150 mg total) by mouth daily. 02/22/15  Yes Janith Lima, MD  fesoterodine (TOVIAZ) 8 MG TB24 tablet Take 1 tablet (8 mg total) by mouth daily. 12/31/14  Yes Janith Lima, MD  fexofenadine (ALLEGRA) 60 MG tablet Take 1 tablet (60 mg total) by mouth 2 (two) times daily. 02/08/15  Yes Deneise Lever, MD  Fluticasone Furoate-Vilanterol (BREO ELLIPTA) 100-25 MCG/INH AEPB Inhale 1 puff then rinse mouth, once daily Patient taking differently: Inhale 1 puff into the lungs daily. Inhale 1 puff  then rinse mouth, once daily 02/08/15  Yes Deneise Lever, MD  gabapentin (NEURONTIN) 600 MG tablet Take 1 tablet (600 mg total) by mouth 2 (two) times daily. 03/06/15  Yes Rebecca S Tat, DO  HUMALOG KWIKPEN 200 UNIT/ML SOPN INJECT 10 UNITS SUBCUTANEOUSLY 3 TIMES DAILY WITH MEALS. 03/16/15  Yes Janith Lima, MD  Insulin Detemir (LEVEMIR FLEXPEN) 100 UNIT/ML Pen Inject 50 Units into the skin daily at 10 pm. Patient taking differently: Inject 50 Units into the skin every evening. Daily at 8 pm 02/13/15  Yes Janith Lima, MD  lamoTRIgine (LAMICTAL) 25 MG tablet Take 1 tablet (25 mg total) by mouth daily. 02/22/15  Yes Janith Lima, MD  levothyroxine (SYNTHROID, LEVOTHROID) 75 MCG tablet TAKE (1) TABLET BY MOUTH ONCE DAILY BEFORE BREAKFAST. 01/12/15  Yes Janith Lima, MD  metoprolol succinate (TOPROL-XL) 50 MG 24 hr tablet TAKE 1 TABLET BY MOUTH ONCE DAILY. 01/12/15  Yes Janith Lima, MD  neomycin-polymyxin-pramoxine (NEOSPORIN PLUS) 1 % cream Apply topically 3 (three) times daily. 02/13/15  Yes Janith Lima, MD  nystatin (MYCOSTATIN) 100000 UNIT/ML suspension Take 5 mLs by mouth 4 (four) times daily.   Yes Historical Provider, MD  nystatin cream (MYCOSTATIN) Apply 1 application topically 2 (two) times daily.   Yes Historical Provider, MD  Omega-3 Fatty Acids (FISH OIL PO) Take 1 capsule by mouth daily.   Yes Historical Provider, MD  pantoprazole (PROTONIX) 40 MG tablet Take 1 tablet (40 mg total) by mouth daily. 02/27/15  Yes Janith Lima, MD  polyethylene glycol [facility]) packet Take 17 g by mouth daily. 03/08/15  Yes Rolland Porter, MD  potassium chloride SA (K-DUR,KLOR-CON) 20 MEQ tablet TAKE 1 TABLET BY MOUTH ONCE DAILY. 01/12/15  Yes Janith Lima, MD  primidone (MYSOLINE) 50 MG tablet Take 2 tablets (100 mg total) by mouth 2 (two) times daily. 03/06/15  Yes Rebecca S Tat, DO  QUEtiapine (SEROQUEL) 50 MG tablet Take 1 tablet (50 mg total) by mouth at bedtime. 02/22/15  Yes Janith Lima, MD   ranitidine (ZANTAC) 150 MG tablet Take 1 tablet (150 mg  total) by mouth at bedtime. 03/12/15  Yes Orvil Feil, NP  Respiratory Therapy Supplies (FLUTTER) DEVI Use as directed 01/18/15  Yes Tanda Rockers, MD  Respiratory Therapy Supplies (FLUTTER) DEVI Blow through 4 times per set and repeat 3 sets per day while needed for chest congestion J45.20 03/05/15  Yes Deneise Lever, MD  sucralfate (CARAFATE) 1 G tablet Take 1 tablet (1 g total) by mouth 4 (four) times daily. 02/22/15  Yes Janith Lima, MD  triamterene-hydrochlorothiazide (MAXZIDE-25) 37.5-25 MG tablet TAKE 1 TABLET BY MOUTH ONCE DAILY. 01/12/15  Yes Janith Lima, MD  Homeopathic Products (ARNICARE) GEL Apply 1 Act topically daily. Patient not taking: Reported on 03/16/2015 02/13/15   Janith Lima, MD  HYDROcodone-acetaminophen [facility]) 10-325 MG per tablet Take 1 tablet by mouth every 6 (six) hours as needed for moderate pain. Patient not taking: Reported on 03/16/2015 12/25/14   Janith Lima, MD  loperamide (IMODIUM) 2 MG capsule Take 1 capsule (2 mg total) by mouth as needed for diarrhea or loose stools. Patient not taking: Reported on 03/16/2015 02/13/15   Janith Lima, MD  miconazole (MONISTAT 1 COMBINATION PACK) kit Place 1 each vaginally once. Patient not taking: Reported on 03/16/2015 02/16/15   Janith Lima, MD  midazolam (VERSED) 2 MG/2ML SOLN injection Inject 1-2 mLs (1-2 mg total) into the vein every 5 (five) minutes as needed for sedation ((up to 6 mg)). Patient not taking: Reported on 03/16/2015 03/04/15   Daneil Dolin, MD  Nutritional Supplements (BOOST DIABETIC) LIQD Take 1 Act by mouth daily. Patient not taking: Reported on 03/16/2015 02/27/15   Janith Lima, MD  ondansetron (ZOFRAN ODT) 8 MG disintegrating tablet Take 1 tablet (8 mg total) by mouth every 8 (eight) hours as needed for nausea or vomiting. Patient not taking: Reported on 03/16/2015 11/24/14   Evalee Jefferson, PA-C  predniSONE (DELTASONE) 10 MG tablet  Take 4 tablets (40 mg total) by mouth daily. 03/16/15   Elizabeth Sorrow, MD  Sanitary Napkins & Tampons (EQ MAXI OVERNIGHT EXTRA HEAVY) PADS Use as needed Patient not taking: Reported on 03/16/2015 02/27/15   Janith Lima, MD  Shampoos (DRY SHAMPOO) AERO Apply 1 spray topically as needed. Patient not taking: Reported on 03/16/2015 02/27/15   Janith Lima, MD  sodium chloride (OCEAN) 0.65 % SOLN nasal spray 1-2 sprays each nostril up to 4 times daily as needed Patient not taking: Reported on 03/16/2015 02/08/15   Deneise Lever, MD   BP 118/79 mmHg  Pulse 99  Temp(Src) 98.4 F (36.9 C) (Oral)  Resp 20  Ht 5' 4.5" (1.638 m)  Wt 78.019 kg  BMI 29.08 kg/m2  SpO2 100% Physical Exam  Constitutional: She is oriented to person, place, and time. She appears well-developed and well-nourished.  HENT:  Head: Normocephalic and atraumatic.  Mucous membranes moist  Eyes: Pupils are equal, round, and reactive to light.  Sclera clear  Cardiovascular: Normal rate.   Pulmonary/Chest: Effort normal.  Lungs clear bilaterally  Abdominal: Bowel sounds are normal. She exhibits no distension. There is no tenderness.  Musculoskeletal: She exhibits no edema.  No leg swelling  Neurological: She is alert and oriented to person, place, and time.  Skin: Skin is warm and dry.  Psychiatric: She has a normal mood and affect.  Nursing note and vitals reviewed.   ED Course  Procedures (including critical care time)  DIAGNOSTIC STUDIES: Oxygen Saturation is 99% on room air, normal by my  interpretation.    COORDINATION OF CARE: 10:45 AM Discussed treatment plan with pt at bedside and pt agreed to plan.   Labs Review Labs Reviewed  CBG MONITORING, ED - Abnormal; Notable for the following:    Glucose-Capillary 214 (*)    All other components within normal limits    Imaging Review Dg Chest 2 View  03/16/2015  CLINICAL DATA:  Shortness of Breath starting this morning after eating breakfast EXAM:  CHEST  2 VIEW COMPARISON:  03/07/2015 FINDINGS: Cardiomediastinal silhouette is stable. No acute infiltrate or pleural effusion. No pulmonary edema. Mild degenerative changes mid thoracic spine again noted. Stimulator devices overlying upper chest wall bilaterally again noted. IMPRESSION: No active cardiopulmonary disease. Electronically Signed   By: Lahoma Crocker M.D.   On: 03/16/2015 13:29   I have personally reviewed and evaluated these images and lab results as part of my medical decision-making.   EKG Interpretation   Date/Time:  Saturday March 16 2015 10:19:18 EST Ventricular Rate:  100 PR Interval:  167 QRS Duration: 90 QT Interval:  343 QTC Calculation: 442 R Axis:   90 Text Interpretation:  Sinus tachycardia Borderline right axis deviation  Borderline T wave abnormalities No significant change since last tracing  Confirmed by Taraya Steward  MD, [place] ([id number]) on 03/16/2015 10:22:04 AM      MDM   Final diagnoses:  Asthma attack    She with a history of asthma. No wheezing here with patient felt like she was having shortness or breath this morning at breakfast her assisted living facility gave her a nebulizer treatment with some improvement with patient wanted a second one they don't have orders for second one that closer she was brought in for evaluation. Chest x-rays negative shows no evidence of any pneumonia pneumothorax or pulmonary edema. Patient still without any wheezing. Blood sugar at 200. Patient will be treated with a short course of prednisone she says this usually helps because she was having breathing problems for the past few days. Patient overall feels much better.    I personally performed the services described in this documentation, which was scribed in my presence. The recorded information has been reviewed and is accurate.       Elizabeth Sorrow, MD 03/16/15 1343

## 2015-03-16 NOTE — ED Notes (Addendum)
Patient brought in via EMS from an assisted living facility. Alert and oriented. Airway patent. Patient reports becoming short of breath this morning after eating breakfast. Per patient used inhaler and 1 neb treatment with no improvement. Denies chest pain but states "IT is sore from coughing." Patient states that her coughing is productive with thick yellow sputum. Patient unsure of any fevers.

## 2015-03-17 ENCOUNTER — Telehealth: Payer: Self-pay | Admitting: Family Medicine

## 2015-03-17 NOTE — Telephone Encounter (Signed)
Received call from team health nurse. She received a call from the patient's ALF stating that patient's blood sugar has been elevated this evening. Notes it was elevated to 280 at 10:10 PM. Patient had received 10 units of Humalog after eating dinner when her blood sugar was 279. She then received her dose of 50 units of Levemir prior to bedtime. They rechecked her blood sugar at midnight and her blood sugar was 247. This had improved from 280 prior to receiving Levemir. The team health nurse called back to the nursing facility to find out what her blood sugars have been running on a daily basis and it seems as though they've been running in the 120-220 range on a daily basis over the last several days. I reviewed the chart and it appears that she was in the ED earlier yesterday for an asthma exacerbation and her blood sugar was 214. The last several weeks of sugars noted in our system range from 154-214. It appears that she has been placed on prednisone for an asthma exacerbation and this could be contributing to her sugars being they are. Notes currently no symptoms of hyperglycemia. Advised that given improvement following Levemir that they should recheck the blood sugar in 4 hours. And then again in the morning. If her blood sugar is greater than 300 or if she develops any symptoms of hyperglycemia (tremor, shakiness, sweating, confusion) which were outlined they should call back for further instructions. If she has no symptoms of hyperglycemia and blood sugar continues to be less than 300 they should continue to monitor. They should continue her insulin regimen as they have been at this time. We'll forward to the patient's PCP as an FYI.

## 2015-03-18 NOTE — Telephone Encounter (Signed)
Letter signed by Dr. Annamaria Boots and faxed to verified fax # provided by pt.  Pt aware and voiced no further questions or concerns at this time.

## 2015-03-18 NOTE — Telephone Encounter (Signed)
Pt states she has not received this letter yet and would like this faxed to her @ [phone] Letter printed and placed on CY cart to be signed. Once signed please fax. Thanks.

## 2015-03-19 ENCOUNTER — Telehealth: Payer: Self-pay | Admitting: Neurology

## 2015-03-19 ENCOUNTER — Ambulatory Visit (INDEPENDENT_AMBULATORY_CARE_PROVIDER_SITE_OTHER): Payer: Commercial Managed Care - HMO | Admitting: Internal Medicine

## 2015-03-19 ENCOUNTER — Telehealth: Payer: Self-pay | Admitting: Internal Medicine

## 2015-03-19 ENCOUNTER — Encounter: Payer: Self-pay | Admitting: Internal Medicine

## 2015-03-19 ENCOUNTER — Other Ambulatory Visit: Payer: Self-pay | Admitting: Internal Medicine

## 2015-03-19 ENCOUNTER — Telehealth: Payer: Self-pay | Admitting: *Deleted

## 2015-03-19 VITALS — BP 138/86 | HR 90 | Temp 98.3°F | Resp 16 | Ht 64.5 in | Wt 174.0 lb

## 2015-03-19 VITALS — BP 122/80 | HR 101 | Ht 64.5 in | Wt 168.8 lb

## 2015-03-19 DIAGNOSIS — M4806 Spinal stenosis, lumbar region: Secondary | ICD-10-CM

## 2015-03-19 DIAGNOSIS — M48061 Spinal stenosis, lumbar region without neurogenic claudication: Secondary | ICD-10-CM

## 2015-03-19 DIAGNOSIS — J302 Other seasonal allergic rhinitis: Secondary | ICD-10-CM

## 2015-03-19 DIAGNOSIS — R2689 Other abnormalities of gait and mobility: Secondary | ICD-10-CM

## 2015-03-19 DIAGNOSIS — J4540 Moderate persistent asthma, uncomplicated: Secondary | ICD-10-CM | POA: Diagnosis not present

## 2015-03-19 DIAGNOSIS — K219 Gastro-esophageal reflux disease without esophagitis: Secondary | ICD-10-CM

## 2015-03-19 DIAGNOSIS — G4733 Obstructive sleep apnea (adult) (pediatric): Secondary | ICD-10-CM

## 2015-03-19 DIAGNOSIS — J3089 Other allergic rhinitis: Secondary | ICD-10-CM

## 2015-03-19 DIAGNOSIS — G250 Essential tremor: Secondary | ICD-10-CM

## 2015-03-19 DIAGNOSIS — J309 Allergic rhinitis, unspecified: Secondary | ICD-10-CM

## 2015-03-19 MED ORDER — NYSTATIN 100000 UNIT/ML MT SUSP: 5.0000 mL | Freq: Four times a day (QID) | OROMUCOSAL | Status: DC | PRN

## 2015-03-19 MED ORDER — PROMETHAZINE-CODEINE 6.25-10 MG/5ML PO SYRP: 5.0000 mL | ORAL_SOLUTION | Freq: Four times a day (QID) | ORAL | Status: DC | PRN

## 2015-03-19 MED ORDER — HYDROCODONE-ACETAMINOPHEN 10-325 MG PO TABS: 1.0000 | ORAL_TABLET | Freq: Four times a day (QID) | ORAL | Status: DC | PRN

## 2015-03-19 MED ORDER — NEOMYCIN-POLYMYXIN-PRAMOXINE 1 % EX CREA: TOPICAL_CREAM | CUTANEOUS | Status: DC | PRN

## 2015-03-19 MED ORDER — LIDO-CAPSAICIN-MEN-METHYL SAL 0.5-0.035-5-20 % EX PTCH: 1.0000 | MEDICATED_PATCH | Freq: Every day | CUTANEOUS | Status: DC

## 2015-03-19 MED ORDER — MICONAZOLE NITRATE 1200 & 2 MG & % VA KIT: PACK | VAGINAL | Status: DC

## 2015-03-19 MED ORDER — NYSTATIN 100000 UNIT/GM EX CREA: 1.0000 "application " | TOPICAL_CREAM | Freq: Two times a day (BID) | CUTANEOUS | Status: DC | PRN

## 2015-03-19 MED ORDER — POLYETHYLENE GLYCOL 3350 17 G PO PACK: 17.0000 g | PACK | Freq: Every day | ORAL | Status: DC | PRN

## 2015-03-19 NOTE — Telephone Encounter (Signed)
Spoke with pt. She is experiencing increased SOB and wheezing. Spoke with CY and wants her added on to see him. This has been done. Nothing further was needed.

## 2015-03-19 NOTE — Telephone Encounter (Signed)
[facility] called regarding the prescription for Lido-Capsaicin-Men-Methyl Sal 0.5-0.035-5-20 % [facility] [id number]  They states there is no such patch and wanting to know what else you would like to prescribe

## 2015-03-19 NOTE — Telephone Encounter (Signed)
Patient called back and states albuterol vials for nebulizer, she needs for the letter to state that she can use it twice a day if needed and put that the vials can stay in her room. I think this is already noted but just in case.

## 2015-03-19 NOTE — Telephone Encounter (Signed)
Called and spoke with [name].  Patient says that she needs Dr. Annamaria Boots to send instructions on [place] letterhead saying that she can keep the Albuterol vials in her room and use them PRN.  Patient had another asthma attack and had to use inhaler.  Patient is having to use inhaler twice daily (if not more).  Patient is aware that Dr. Annamaria Boots is gone for the day and says okay for him to take care of this tomorrow.

## 2015-03-19 NOTE — Telephone Encounter (Signed)
Dr. Annamaria Boots, please advise on below

## 2015-03-19 NOTE — Telephone Encounter (Signed)
Keep doing what they've been doing. I saw the patient today and asked her how they were managing her blood sugars and it appears to me that they are doing a good job. She is going to have erratic blood sugars and be difficult to control.

## 2015-03-19 NOTE — Progress Notes (Signed)
Pre visit review using our clinic review tool, if applicable. No additional management support is needed unless otherwise documented below in the visit note. 

## 2015-03-19 NOTE — Telephone Encounter (Signed)
Pt called for an order with [facility] for [name], [place]/requested to put Debbie Dabs on order?//call back @ [phone]

## 2015-03-19 NOTE — Progress Notes (Signed)
Subjective:  Patient ID: [name], female    DOB: August 03, 1950  Age: 64 y.o. MRN: [id number]  CC: Asthma and Back Pain   HPI Eshal Propps presents for follow-up. She requests lidocaine patches for low back pain. She complains of intermittent wheezing but says the inhaler [facility] is helping.  Outpatient Prescriptions Prior to Visit  Medication Sig Dispense Refill  . ASPIRIN LOW DOSE 81 MG EC tablet TAKE 1 TABLET BY MOUTH ONCE DAILY. 30 tablet 11  . bisacodyl (DULCOLAX) 10 MG suppository Place 1 suppository (10 mg total) rectally as needed for moderate constipation. 12 suppository 0  . fesoterodine (TOVIAZ) 8 MG TB24 tablet Take 1 tablet (8 mg total) by mouth daily. 30 tablet 11  . fexofenadine (ALLEGRA) 60 MG tablet Take 1 tablet (60 mg total) by mouth 2 (two) times daily. 60 tablet 11  . Fluticasone Furoate-Vilanterol (BREO ELLIPTA) 100-25 MCG/INH AEPB Inhale 1 puff then rinse mouth, once daily (Patient taking differently: Inhale 1 puff into the lungs daily. Inhale 1 puff then rinse mouth, once daily) 60 each 11  . HUMALOG KWIKPEN 200 UNIT/ML SOPN INJECT 10 UNITS SUBCUTANEOUSLY 3 TIMES DAILY WITH MEALS. 15 mL 3  . Insulin Detemir (LEVEMIR FLEXPEN) 100 UNIT/ML Pen Inject 50 Units into the skin daily at 10 pm. (Patient taking differently: Inject 50 Units into the skin every evening. Daily at 8 pm) 15 mL 11  . levothyroxine (SYNTHROID, LEVOTHROID) 75 MCG tablet TAKE (1) TABLET BY MOUTH ONCE DAILY BEFORE BREAKFAST. 30 tablet 5  . loperamide (IMODIUM) 2 MG capsule Take 1 capsule (2 mg total) by mouth as needed for diarrhea or loose stools. 30 capsule 11  . metoprolol succinate (TOPROL-XL) 50 MG 24 hr tablet TAKE 1 TABLET BY MOUTH ONCE DAILY. 30 tablet 11  . Omega-3 Fatty Acids (FISH OIL PO) Take 1 capsule by mouth daily.    . ondansetron (ZOFRAN ODT) 8 MG disintegrating tablet Take 1 tablet (8 mg total) by mouth every 8 (eight) hours as needed for nausea or vomiting. 20 tablet 0  .  pantoprazole (PROTONIX) 40 MG tablet Take 1 tablet (40 mg total) by mouth daily. 90 tablet 0  . potassium chloride SA (K-DUR,KLOR-CON) 20 MEQ tablet TAKE 1 TABLET BY MOUTH ONCE DAILY. 30 tablet 11  . predniSONE (DELTASONE) 10 MG tablet Take 4 tablets (40 mg total) by mouth daily. 20 tablet 0  . primidone (MYSOLINE) 50 MG tablet Take 2 tablets (100 mg total) by mouth 2 (two) times daily. 360 tablet 1  . ranitidine (ZANTAC) 150 MG tablet Take 1 tablet (150 mg total) by mouth at bedtime. 30 tablet 5  . Respiratory Therapy Supplies (FLUTTER) DEVI Use as directed 1 each 0  . Respiratory Therapy Supplies (FLUTTER) DEVI Blow through 4 times per set and repeat 3 sets per day while needed for chest congestion J45.20 1 each 0  . sodium chloride (OCEAN) 0.65 % SOLN nasal spray 1-2 sprays each nostril up to 4 times daily as needed 30 mL prn  . triamterene-hydrochlorothiazide (MAXZIDE-25) 37.5-25 MG tablet TAKE 1 TABLET BY MOUTH ONCE DAILY. 30 tablet 11  . albuterol (PROVENTIL HFA;VENTOLIN HFA) 108 (90 BASE) MCG/ACT inhaler Inhale 2 puffs into the lungs every 6 (six) hours as needed for wheezing or shortness of breath. 1 Inhaler 2  . albuterol (PROVENTIL) (2.5 MG/3ML) 0.083% nebulizer solution Take 3 mLs (2.5 mg total) by nebulization every 6 (six) hours as needed for wheezing or shortness of breath. 75 mL 12  .  buPROPion (WELLBUTRIN XL) 150 MG 24 hr tablet Take 1 tablet (150 mg total) by mouth daily. 30 tablet 1  . gabapentin (NEURONTIN) 600 MG tablet Take 1 tablet (600 mg total) by mouth 2 (two) times daily. 180 tablet 1  . HYDROcodone-acetaminophen (NORCO) 10-325 MG per tablet Take 1 tablet by mouth every 6 (six) hours as needed for moderate pain. 75 tablet 0  . lamoTRIgine (LAMICTAL) 25 MG tablet Take 1 tablet (25 mg total) by mouth daily. 30 tablet 1  . miconazole (MONISTAT 1 COMBINATION PACK) kit Place 1 each vaginally once. 1 each 11  . midazolam (VERSED) 2 MG/2ML SOLN injection Inject 1-2 mLs (1-2 mg  total) into the vein every 5 (five) minutes as needed for sedation ((up to 6 mg)). (Patient not taking: Reported on 03/20/2015) 1 mL 0  . neomycin-polymyxin-pramoxine (NEOSPORIN PLUS) 1 % cream Apply topically 3 (three) times daily. 14.2 g 11  . nystatin (MYCOSTATIN) 100000 UNIT/ML suspension Take 5 mLs by mouth 4 (four) times daily.    Marland Kitchen nystatin cream (MYCOSTATIN) Apply 1 application topically 2 (two) times daily.    . polyethylene glycol (MIRALAX) packet Take 17 g by mouth daily. 14 each 0  . QUEtiapine (SEROQUEL) 50 MG tablet Take 1 tablet (50 mg total) by mouth at bedtime. 30 tablet 1  . sucralfate (CARAFATE) 1 G tablet Take 1 tablet (1 g total) by mouth 4 (four) times daily. (Patient taking differently: Take 1 g by mouth 4 (four) times daily -  before meals and at bedtime. ) 120 tablet 1  . Sanitary Napkins & Tampons (EQ MAXI OVERNIGHT EXTRA HEAVY) PADS Use as needed 20 each 5  . Homeopathic Products (ARNICARE) GEL Apply 1 Act topically daily. (Patient not taking: Reported on 03/20/2015) 75 g 11  . Nutritional Supplements (BOOST DIABETIC) LIQD Take 1 Act by mouth daily. (Patient not taking: Reported on 03/16/2015) 237 mL 11  . Shampoos (DRY SHAMPOO) AERO Apply 1 spray topically as needed. (Patient not taking: Reported on 03/16/2015) 142 g 4   No facility-administered medications prior to visit.    ROS Review of Systems  Constitutional: Negative.  Negative for fever, chills, diaphoresis, appetite change and fatigue.  HENT: Negative.   Eyes: Negative.   Respiratory: Positive for wheezing. Negative for cough, choking, chest tightness, shortness of breath and stridor.   Cardiovascular: Negative.  Negative for chest pain, palpitations and leg swelling.  Gastrointestinal: Negative.  Negative for nausea, vomiting, abdominal pain, diarrhea, constipation and blood in stool.  Endocrine: Negative.   Genitourinary: Negative.   Musculoskeletal: Positive for back pain. Negative for myalgias, joint  swelling, arthralgias, gait problem and neck pain.  Skin: Negative.  Negative for pallor and rash.  Allergic/Immunologic: Negative.   Neurological: Negative.  Negative for dizziness, tremors, weakness, light-headedness and numbness.  Hematological: Negative.  Negative for adenopathy. Does not bruise/bleed easily.  Psychiatric/Behavioral: Negative.     Objective:  BP 138/86 mmHg  Pulse 90  Temp(Src) 98.3 F (36.8 C) (Oral)  Resp 16  Ht 5' 4.5" (1.638 m)  Wt 174 lb (78.926 kg)  BMI 29.42 kg/m2  SpO2 97%  BP Readings from Last 3 Encounters:  03/20/15 126/78  03/19/15 122/80  03/19/15 138/86    Wt Readings from Last 3 Encounters:  03/19/15 168 lb 12.8 oz (76.567 kg)  03/19/15 174 lb (78.926 kg)  03/16/15 172 lb (78.019 kg)    Physical Exam  Constitutional: She is oriented to person, place, and time. No distress.  HENT:  Head: Normocephalic and atraumatic.  Mouth/Throat: Oropharynx is clear and moist. No oropharyngeal exudate.  Eyes: Conjunctivae are normal. Right eye exhibits no discharge. Left eye exhibits no discharge. No scleral icterus.  Neck: Normal range of motion. Neck supple. No JVD present. No tracheal deviation present. No thyromegaly present.  Cardiovascular: Normal rate, regular rhythm, normal heart sounds and intact distal pulses.  Exam reveals no gallop and no friction rub.   No murmur heard. Pulmonary/Chest: Effort normal and breath sounds normal. No stridor. No respiratory distress. She has no wheezes. She has no rales. She exhibits no tenderness.  Abdominal: Soft. Bowel sounds are normal. She exhibits no distension and no mass. There is no tenderness. There is no rebound and no guarding.  Musculoskeletal: Normal range of motion. She exhibits no edema or tenderness.  Lymphadenopathy:    She has no cervical adenopathy.  Neurological: She is oriented to person, place, and time.  Skin: Skin is warm and dry. No rash noted. She is not diaphoretic. No erythema. No  pallor.  Vitals reviewed.   Lab Results  Component Value Date   WBC 8.9 03/08/2015   HGB 12.1 03/08/2015   HCT 35.1* 03/08/2015   PLT 177 03/08/2015   GLUCOSE 161* 03/08/2015   CHOL 180 02/13/2015   TRIG 175.0* 02/13/2015   HDL 64.80 02/13/2015   LDLCALC 80 02/13/2015   ALT 43 03/08/2015   AST 28 03/08/2015   NA 135 03/08/2015   K 3.9 03/08/2015   CL 102 03/08/2015   CREATININE 0.54 03/08/2015   BUN 12 03/08/2015   CO2 27 03/08/2015   TSH 1.18 02/13/2015   INR 0.99 01/04/2015   HGBA1C 7.0* 02/13/2015   MICROALBUR 1.7 02/13/2015    Dg Chest 2 View  03/16/2015  CLINICAL DATA:  Shortness of Breath starting this morning after eating breakfast EXAM: CHEST  2 VIEW COMPARISON:  03/07/2015 FINDINGS: Cardiomediastinal silhouette is stable. No acute infiltrate or pleural effusion. No pulmonary edema. Mild degenerative changes mid thoracic spine again noted. Stimulator devices overlying upper chest wall bilaterally again noted. IMPRESSION: No active cardiopulmonary disease. Electronically Signed   By: Lahoma Crocker M.D.   On: 03/16/2015 13:29    Assessment & Plan:   Leona was seen today for asthma and back pain.  Diagnoses and all orders for this visit:  Spinal stenosis of lumbar region -     HYDROcodone-acetaminophen (NORCO) 10-325 MG tablet; Take 1 tablet by mouth every 6 (six) hours as needed for moderate pain. -     Discontinue: Lido-Capsaicin-Men-Methyl Sal 0.5-0.035-5-20 % PTCH; Apply 1 Act topically daily.  Other orders -     miconazole (MONISTAT 1 COMBINATION PACK) kit; Use as needed -     neomycin-polymyxin-pramoxine (NEOSPORIN PLUS) 1 % cream; Apply topically as needed. -     nystatin (MYCOSTATIN) 100000 UNIT/ML suspension; Take 5 mLs (500,000 Units total) by mouth 4 (four) times daily as needed. -     nystatin cream (MYCOSTATIN); Apply 1 application topically 2 (two) times daily as needed for dry skin. -     polyethylene glycol (MIRALAX) packet; Take 17 g by mouth  daily as needed for mild constipation.   I have discontinued Ms. Lange's BOOST DIABETIC and Dry Shampoo. I have also changed her miconazole, neomycin-polymyxin-pramoxine, nystatin, nystatin cream, polyethylene glycol, and HYDROcodone-acetaminophen. Additionally, I am having her maintain her ondansetron, fesoterodine, ASPIRIN LOW DOSE, metoprolol succinate, potassium chloride SA, triamterene-hydrochlorothiazide, levothyroxine, FLUTTER, Fluticasone Furoate-Vilanterol, fexofenadine, sodium chloride, loperamide, Insulin Detemir, bisacodyl, Omega-3 Fatty Acids (  FISH OIL PO), pantoprazole, EQ MAXI OVERNIGHT EXTRA HEAVY, FLUTTER, primidone, ranitidine, HUMALOG KWIKPEN, predniSONE, and Plastic Bandages 3/4".  Meds ordered this encounter  Medications  . Adhesive Bandages (PLASTIC BANDAGES 3/4") MISC    Sig:   . miconazole (MONISTAT 1 COMBINATION PACK) kit    Sig: Use as needed    Dispense:  1 each    Refill:  11  . neomycin-polymyxin-pramoxine (NEOSPORIN PLUS) 1 % cream    Sig: Apply topically as needed.    Dispense:  14.2 g    Refill:  11  . nystatin (MYCOSTATIN) 100000 UNIT/ML suspension    Sig: Take 5 mLs (500,000 Units total) by mouth 4 (four) times daily as needed.    Dispense:  60 mL    Refill:  0  . nystatin cream (MYCOSTATIN)    Sig: Apply 1 application topically 2 (two) times daily as needed for dry skin.    Dispense:  30 g    Refill:  0  . polyethylene glycol (MIRALAX) packet    Sig: Take 17 g by mouth daily as needed for mild constipation.    Dispense:  14 each    Refill:  0  . HYDROcodone-acetaminophen (NORCO) 10-325 MG tablet    Sig: Take 1 tablet by mouth every 6 (six) hours as needed for moderate pain.    Dispense:  75 tablet    Refill:  0  . DISCONTD: Lido-Capsaicin-Men-Methyl Sal 0.5-0.035-5-20 % PTCH    Sig: Apply 1 Act topically daily.    Dispense:  30 patch    Refill:  11     Follow-up: Return in about 4 months (around 07/18/2015).  Scarlette Calico, MD

## 2015-03-19 NOTE — Telephone Encounter (Signed)
Faxed msg to [facility]...[name]
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
## 2015-03-19 NOTE — Patient Instructions (Addendum)
Letter recommending you keep your respiratory meds in your room as discussed. You will need to abide by the rules of the care center where you are staying.  Script refilling cough syrup

## 2015-03-19 NOTE — Telephone Encounter (Signed)
Order sent to [facility] per patient request for Physical therapy. They will call patient to schedule.

## 2015-03-19 NOTE — Telephone Encounter (Signed)
Left msg on triage stating needing a order statng what to do when pt blood sugars are high or to low....[name]

## 2015-03-19 NOTE — Patient Instructions (Signed)

## 2015-03-20 ENCOUNTER — Other Ambulatory Visit: Payer: Self-pay | Admitting: Internal Medicine

## 2015-03-20 ENCOUNTER — Encounter (HOSPITAL_COMMUNITY): Payer: Self-pay | Admitting: Emergency Medicine

## 2015-03-20 ENCOUNTER — Other Ambulatory Visit: Payer: Self-pay | Admitting: Neurology

## 2015-03-20 ENCOUNTER — Emergency Department (HOSPITAL_COMMUNITY)
Admission: EM | Admit: 2015-03-20 | Discharge: 2015-03-20 | Disposition: A | Discharge status: Home / Self Care | Payer: Commercial Managed Care - HMO | Attending: Emergency Medicine | Admitting: Emergency Medicine

## 2015-03-20 ENCOUNTER — Telehealth: Payer: Self-pay | Admitting: Internal Medicine

## 2015-03-20 ENCOUNTER — Emergency Department (HOSPITAL_COMMUNITY): Payer: Commercial Managed Care - HMO

## 2015-03-20 DIAGNOSIS — G8929 Other chronic pain: Secondary | ICD-10-CM | POA: Insufficient documentation

## 2015-03-20 DIAGNOSIS — I10 Essential (primary) hypertension: Secondary | ICD-10-CM | POA: Insufficient documentation

## 2015-03-20 DIAGNOSIS — Z7982 Long term (current) use of aspirin: Secondary | ICD-10-CM | POA: Insufficient documentation

## 2015-03-20 DIAGNOSIS — E119 Type 2 diabetes mellitus without complications: Secondary | ICD-10-CM | POA: Insufficient documentation

## 2015-03-20 DIAGNOSIS — R0602 Shortness of breath: Secondary | ICD-10-CM | POA: Diagnosis present

## 2015-03-20 DIAGNOSIS — IMO0001 Reserved for inherently not codable concepts without codable children: Secondary | ICD-10-CM

## 2015-03-20 DIAGNOSIS — F419 Anxiety disorder, unspecified: Secondary | ICD-10-CM | POA: Diagnosis not present

## 2015-03-20 DIAGNOSIS — Z91040 Latex allergy status: Secondary | ICD-10-CM | POA: Insufficient documentation

## 2015-03-20 DIAGNOSIS — Z7952 Long term (current) use of systemic steroids: Secondary | ICD-10-CM | POA: Diagnosis not present

## 2015-03-20 DIAGNOSIS — K219 Gastro-esophageal reflux disease without esophagitis: Secondary | ICD-10-CM | POA: Diagnosis not present

## 2015-03-20 DIAGNOSIS — Z79899 Other long term (current) drug therapy: Secondary | ICD-10-CM | POA: Diagnosis not present

## 2015-03-20 DIAGNOSIS — E039 Hypothyroidism, unspecified: Secondary | ICD-10-CM | POA: Diagnosis not present

## 2015-03-20 DIAGNOSIS — Z794 Long term (current) use of insulin: Secondary | ICD-10-CM | POA: Diagnosis not present

## 2015-03-20 DIAGNOSIS — J441 Chronic obstructive pulmonary disease with (acute) exacerbation: Secondary | ICD-10-CM | POA: Insufficient documentation

## 2015-03-20 DIAGNOSIS — F329 Major depressive disorder, single episode, unspecified: Secondary | ICD-10-CM | POA: Insufficient documentation

## 2015-03-20 DIAGNOSIS — Z87891 Personal history of nicotine dependence: Secondary | ICD-10-CM | POA: Insufficient documentation

## 2015-03-20 DIAGNOSIS — Z7951 Long term (current) use of inhaled steroids: Secondary | ICD-10-CM | POA: Insufficient documentation

## 2015-03-20 DIAGNOSIS — E871 Hypo-osmolality and hyponatremia: Secondary | ICD-10-CM | POA: Insufficient documentation

## 2015-03-20 MED ORDER — LIDOCAINE 5 % EX PTCH
1.0000 | MEDICATED_PATCH | CUTANEOUS | Status: DC
Start: 2015-03-20 — End: 2016-04-21

## 2015-03-20 MED ORDER — ALBUTEROL SULFATE (2.5 MG/3ML) 0.083% IN NEBU: INHALATION_SOLUTION | RESPIRATORY_TRACT | Status: DC

## 2015-03-20 NOTE — ED Notes (Addendum)
Awaiting EMS to take patient back to facility.  Patient with no complaints at this time. Respirations even and unlabored. Skin warm/dry. Discharge instructions reviewed with patient at this time. Patient given opportunity to voice concerns/ask questions. IV removed per policy and band-aid applied to site.   Called assisted living and given discharge instructions as well.

## 2015-03-20 NOTE — Telephone Encounter (Signed)
Gabapentin refill requested. Per last office note- patient to remain on medication. Refill approved and sent to patient's pharmacy.   

## 2015-03-20 NOTE — ED Notes (Signed)
Patient left ED at this time with EMS. No distress upon departure.

## 2015-03-20 NOTE — ED Notes (Signed)
States Shortness of breath. Speaking in full sentences. No distress. 2 neb treatments PTA, pt took home Prednisone.

## 2015-03-20 NOTE — ED Provider Notes (Signed)
CSN: [id number]     Arrival date & time 03/20/15  7793 History  By signing my name below, I, [name], attest that this documentation has been prepared under the direction and in the presence of [name], MD. Electronically Signed: Jolayne [name], Scribe. 03/20/2015. 10:48 AM.    Chief Complaint  Patient presents with  . Shortness of Breath    Patient is a 64 y.o. female presenting with shortness of breath. The history is provided by the patient (pt complains of SOB). No language interpreter was used.  Shortness of Breath Severity:  Mild Onset quality:  Sudden Duration:  2 hours Timing:  Constant Progression:  Unchanged Chronicity:  New Relieved by: breathing treatment in the ED. Worsened by:  Nothing tried Ineffective treatments:  Inhaler Associated symptoms: sputum production and wheezing   Associated symptoms: no abdominal pain, no chest pain, no cough, no headaches and no rash     HPI Comments: [name] is a 64 y.o. female who presents to the Emergency Department complaining of SOB and occurrence of an asthma attack this morning. Pt also reports sinus drainage and congestion with production of clear sputum. She reports use of albuterol at her house, albuterol in the ambulance, and a duo nebulizer before arriving to the ED. She also reports using 2 puffs of her albuterol rescue inhaler before each treatment she received. Pt's SOB has now resolved since treatment but she complains of her asthma attacks being a recurrent issue. Pt was previously prescribed benadryl with no relief.   Past Medical History  Diagnosis Date  . 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 [name], residual palpitations  . Vocal cord dysfunction   . Tremor, essential     takes Mysoline and Neurontin daily.  . Allergic rhinitis     uses Nasonex daily as needed  . Low sodium syndrome   . Chronic back pain     reason unknown  . Lumbar  radiculopathy   . DDD (degenerative disc disease), lumbar   . Esophageal reflux     takes Zantac daily  . Hypertension     takes Losartan daily  . Anxiety     takes Ativan daily as needed  . Hypothyroidism     takes Synthroid daily  . Depression     takes Zoloft daily as well as Cymbalta  . Diabetes mellitus     takes NOvolin daily  . Asthma     Albuterol inhaler and Neb daily as needed  . History of bronchitis Dec 2014  . Seizures ([place]) 04-05-13    one time ran out of Primidone and had stopped it cold [place]  . Weakness     in left arm  . Joint pain   . Joint swelling     left ankle  . Dysphagia   . Constipation   . Allergy to angiotensin receptor blockers (ARB) 01/22/2014    Angioedema  . 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 Rayann Heman 2007   Past Surgical History  Procedure Laterality Date  . Tonsillectomy    . Right breast cyst      benign  . Left ankle ligament repair      x 2  . Left elbow repair    . Partial hysterectomy    . Cholecystectomy    . Cesarean section    . Ablation for avnrt    . Colonoscopy  01/16/2004  XNT:ZGYFVC rectum/colon  . Esophagogastroduodenoscopy (egd) with esophageal dilation  04/03/2002    BSW:[id number]'W ring, otherwise normal esophagus, status post dilation with 54 F/Normal stomach  . Bartholin gland cyst excision      x 2  . Esophagogastroduodenoscopy (egd) with esophageal dilation N/A 11/08/2012    Dr. [name]:schatzki's ring s/p dilation/hiatal hernia  . Subthalamic stimulator insertion Bilateral 12/04/2013    Procedure: Bilateral Deep brain stimulator placement;  Surgeon: [name], MD;  Location: [place] [facility];  Service: Neurosurgery;  Laterality: Bilateral;  Bilateral Deep brain stimulator placement  . Pulse generator implant Bilateral 12/15/2013    Procedure: BILATERAL PULSE GENERATOR IMPLANT;  Surgeon: [name], MD;  Location: [place] [facility];  Service: Neurosurgery;  Laterality: Bilateral;  BILATERAL  . Maximum access  (mas)posterior lumbar interbody fusion (plif) 1 level N/A 04/24/2014    Procedure: Lumbar four-five Maximum access Surgery posterior lumbar interbody fusion;  Surgeon: [name], MD;  Location: [place] [facility];  Service: Neurosurgery;  Laterality: N/A;  Lumbar four-five Maximum access Surgery posterior lumbar interbody fusion  . Colonoscopy with propofol N/A 03/04/2015    Procedure: COLONOSCOPY WITH PROPOFOL at cecum at 1011; withdrawal time=9mnutes;  Surgeon: RDaneil Dolin MD;  Location: [facility];  Service: Endoscopy;  Laterality: N/A;  . Esophagogastroduodenoscopy (egd) with propofol N/A 03/04/2015    Procedure: ESOPHAGOGASTRODUODENOSCOPY (EGD) WITH PROPOFOL Procedure #1;  Surgeon: RDaneil Dolin MD;  Location: [facility];  Service: Endoscopy;  Laterality: N/A;  . Esophageal dilation N/A 03/04/2015    Procedure: ESOPHAGEAL DILATION WITH 54FR MALONEY DILATOR;  Surgeon: RDaneil Dolin MD;  Location: [facility];  Service: Endoscopy;  Laterality: N/A;  . Esophageal biopsy  03/04/2015    Procedure: BIOPSY (Duodenal, Gastric);  Surgeon: RDaneil Dolin MD;  Location: [facility];  Service: Endoscopy;;  . Polypectomy  03/04/2015    Procedure: POLYPECTOMY (descending colon);  Surgeon: RDaneil Dolin MD;  Location: [facility];  Service: Endoscopy;;   Family History  Problem Relation Age of Onset  . Lung cancer Father     DIED AGE [id number] CA  . Alcohol abuse Father   . Anxiety disorder Father   . Depression Father   . COPD Mother     DIED AGE 56,MRSA,COPD,PNEUMONIA  . Pneumonia Mother     DIED AGE 56,MRSA,COPD,PNEUMONIA  . Supraventricular tachycardia Mother   . Anxiety disorder Mother   . Depression Mother   . Alcohol abuse Mother   . COPD Brother     AGE 32643 . Heart disease Sister     DIED AGE 32619 SMOKER,?HEART  . Colon cancer Neg Hx    Social History  Substance Use Topics  . Smoking status: Former Smoker -- 0.30 packs/day for 10 years    Types: Cigarettes    Quit date: 04/23/1993  . Smokeless  tobacco: Never Used     Comment: quit smoking 25+yrs ago  . Alcohol Use: No     Comment: no alcohol in 384yr  OB History    Gravida Para Term Preterm AB TAB SAB Ectopic Multiple Living   _0 Review of Systems  Constitutional: Negative for appetite change and fatigue.  HENT: Positive for congestion. Negative for ear discharge and sinus pressure.        Sinus drainage with clear sputum  Eyes: Negative for discharge.  Respiratory: Positive for sputum production, shortness of breath and wheezing. Negative for cough.  Cardiovascular: Negative for chest pain.  Gastrointestinal: Negative for abdominal pain and diarrhea.  Genitourinary: Negative for frequency and hematuria.  Musculoskeletal: Negative for back pain.  Skin: Negative for rash.  Neurological: Negative for seizures and headaches.  Psychiatric/Behavioral: Negative for hallucinations.  All other systems reviewed and are negative.   Allergies  Invokana; Losartan; Metformin and related; Latex; Morphine and related; Other; and Oxycodone-acetaminophen  Home Medications   Prior to Admission medications   Medication Sig Start Date End Date Taking? Authorizing Provider  Adhesive Bandages (PLASTIC BANDAGES 3/4") MISC  03/08/15   Historical Provider, MD  albuterol (PROVENTIL HFA;VENTOLIN HFA) 108 (90 BASE) MCG/ACT inhaler Inhale 2 puffs into the lungs every 6 (six) hours as needed for wheezing or shortness of breath. 11/26/14   Ezequiel Essex, MD  albuterol (PROVENTIL) (2.5 MG/3ML) 0.083% nebulizer solution Take 3 mLs (2.5 mg total) by nebulization every 6 (six) hours as needed for wheezing or shortness of breath. 11/28/14   Deneise Lever, MD  ASPIRIN LOW DOSE 81 MG EC tablet TAKE 1 TABLET BY MOUTH ONCE DAILY. 01/12/15   Janith Lima, MD  bisacodyl (DULCOLAX) 10 MG suppository Place 1 suppository (10 mg total) rectally as needed for moderate constipation. 02/25/15   Janith Lima, MD  buPROPion (WELLBUTRIN XL) 150 MG  24 hr tablet Take 1 tablet (150 mg total) by mouth daily. 02/22/15   Janith Lima, MD  fesoterodine (TOVIAZ) 8 MG TB24 tablet Take 1 tablet (8 mg total) by mouth daily. 12/31/14   Janith Lima, MD  fexofenadine (ALLEGRA) 60 MG tablet Take 1 tablet (60 mg total) by mouth 2 (two) times daily. 02/08/15   Deneise Lever, MD  Fluticasone Furoate-Vilanterol (BREO ELLIPTA) 100-25 MCG/INH AEPB Inhale 1 puff then rinse mouth, once daily Patient taking differently: Inhale 1 puff into the lungs daily. Inhale 1 puff then rinse mouth, once daily 02/08/15   Deneise Lever, MD  gabapentin (NEURONTIN) 600 MG tablet Take 1 tablet (600 mg total) by mouth 2 (two) times daily. 03/06/15   Eustace Quail Tat, DO  Homeopathic Products (ARNICARE) GEL Apply 1 Act topically daily. 02/13/15   Janith Lima, MD  HUMALOG KWIKPEN 200 UNIT/ML SOPN INJECT 10 UNITS SUBCUTANEOUSLY 3 TIMES DAILY WITH MEALS. 03/16/15   Janith Lima, MD  HYDROcodone-acetaminophen (NORCO) 10-325 MG tablet Take 1 tablet by mouth every 6 (six) hours as needed for moderate pain. 03/19/15   Janith Lima, MD  Insulin Detemir (LEVEMIR FLEXPEN) 100 UNIT/ML Pen Inject 50 Units into the skin daily at 10 pm. Patient taking differently: Inject 50 Units into the skin every evening. Daily at 8 pm 02/13/15   Janith Lima, MD  lamoTRIgine (LAMICTAL) 25 MG tablet Take 1 tablet (25 mg total) by mouth daily. 02/22/15   Janith Lima, MD  levothyroxine (SYNTHROID, LEVOTHROID) 75 MCG tablet TAKE (1) TABLET BY MOUTH ONCE DAILY BEFORE BREAKFAST. 01/12/15   Janith Lima, MD  lidocaine (LIDODERM) 5 % Place 1 patch onto the skin daily. Remove & Discard patch within 12 hours or as directed by MD 03/20/15   Janith Lima, MD  loperamide (IMODIUM) 2 MG capsule Take 1 capsule (2 mg total) by mouth as needed for diarrhea or loose stools. 02/13/15   Janith Lima, MD  metoprolol succinate (TOPROL-XL) 50 MG 24 hr tablet TAKE 1 TABLET BY MOUTH ONCE DAILY. 01/12/15   Janith Lima,  MD  miconazole (MONISTAT 1 COMBINATION PACK) kit Use as  needed 03/19/15   Janith Lima, MD  midazolam (VERSED) 2 MG/2ML SOLN injection Inject 1-2 mLs (1-2 mg total) into the vein every 5 (five) minutes as needed for sedation ((up to 6 mg)). 03/04/15   Daneil Dolin, MD  neomycin-polymyxin-pramoxine (NEOSPORIN PLUS) 1 % cream Apply topically as needed. 03/19/15   Janith Lima, MD  nystatin (MYCOSTATIN) 100000 UNIT/ML suspension Take 5 mLs (500,000 Units total) by mouth 4 (four) times daily as needed. 03/19/15   Janith Lima, MD  nystatin cream (MYCOSTATIN) Apply 1 application topically 2 (two) times daily as needed for dry skin. 03/19/15   Janith Lima, MD  Omega-3 Fatty Acids (FISH OIL PO) Take 1 capsule by mouth daily.    Historical Provider, MD  ondansetron (ZOFRAN ODT) 8 MG disintegrating tablet Take 1 tablet (8 mg total) by mouth every 8 (eight) hours as needed for nausea or vomiting. 11/24/14   Evalee Jefferson, PA-C  pantoprazole (PROTONIX) 40 MG tablet Take 1 tablet (40 mg total) by mouth daily. 02/27/15   Janith Lima, MD  polyethylene glycol [facility]) packet Take 17 g by mouth daily as needed for mild constipation. 03/19/15   Janith Lima, MD  potassium chloride SA (K-DUR,KLOR-CON) 20 MEQ tablet TAKE 1 TABLET BY MOUTH ONCE DAILY. 01/12/15   Janith Lima, MD  predniSONE (DELTASONE) 10 MG tablet Take 4 tablets (40 mg total) by mouth daily. 03/16/15   Fredia Sorrow, MD  primidone (MYSOLINE) 50 MG tablet Take 2 tablets (100 mg total) by mouth 2 (two) times daily. 03/06/15   Eustace Quail Tat, DO  promethazine-codeine (PHENERGAN WITH CODEINE) 6.25-10 MG/5ML syrup Take 5 mLs by mouth every 6 (six) hours as needed for cough. 03/19/15   Deneise Lever, MD  QUEtiapine (SEROQUEL) 50 MG tablet Take 1 tablet (50 mg total) by mouth at bedtime. 02/22/15   Janith Lima, MD  ranitidine (ZANTAC) 150 MG tablet Take 1 tablet (150 mg total) by mouth at bedtime. 03/12/15   Orvil Feil, NP  Respiratory  Therapy Supplies (FLUTTER) DEVI Use as directed 01/18/15   Tanda Rockers, MD  Respiratory Therapy Supplies (FLUTTER) DEVI Blow through 4 times per set and repeat 3 sets per day while needed for chest congestion J45.20 03/05/15   Deneise Lever, MD  Sanitary Napkins & Tampons (EQ MAXI OVERNIGHT EXTRA HEAVY) PADS Use as needed 02/27/15   Janith Lima, MD  sodium chloride (OCEAN) 0.65 % SOLN nasal spray 1-2 sprays each nostril up to 4 times daily as needed 02/08/15   Deneise Lever, MD  sucralfate (CARAFATE) 1 G tablet Take 1 tablet (1 g total) by mouth 4 (four) times daily. Patient taking differently: Take 1 g by mouth 4 (four) times daily -  before meals and at bedtime.  02/22/15   Janith Lima, MD  tiZANidine (ZANAFLEX) 4 MG tablet TAKE 1 TABLET BY MOUTH AT BEDTIME AS NEEDED FOR MUSCLE SPASMS. 03/19/15   Janith Lima, MD  triamterene-hydrochlorothiazide (MAXZIDE-25) 37.5-25 MG tablet TAKE 1 TABLET BY MOUTH ONCE DAILY. 01/12/15   Janith Lima, MD   BP 142/83 mmHg  Pulse 103  Temp(Src) 98.2 F (36.8 C) (Oral)  Resp 18  SpO2 97% Physical Exam  Constitutional: She is oriented to person, place, and time. She appears well-developed.  HENT:  Head: Normocephalic.  Eyes: Conjunctivae and EOM are normal. No scleral icterus.  Neck: Neck supple. No thyromegaly present.  Cardiovascular: Normal rate and regular  rhythm.  Exam reveals no gallop and no friction rub.   No murmur heard. Pulmonary/Chest: No stridor. She has no wheezes. She has no rales. She exhibits no tenderness.  Minimal wheezing  Abdominal: She exhibits no distension. There is no tenderness. There is no rebound.  Musculoskeletal: Normal range of motion. She exhibits no edema.  Lymphadenopathy:    She has no cervical adenopathy.  Neurological: She is oriented to person, place, and time. She exhibits normal muscle tone. Coordination normal.  Skin: No rash noted. No erythema.  Psychiatric: She has a normal mood and affect. Her  behavior is normal.    ED Course  Procedures  DIAGNOSTIC STUDIES:    Oxygen Saturation is 97% on RA, normal by my interpretation.   COORDINATION OF CARE:  10:34 AM Will order chest x-ray. Discussed treatment plan with pt at bedside and pt agreed to plan.   Imaging Review Dg Chest 2 View  03/20/2015  CLINICAL DATA:  64 year old female with shortness of breath and productive cough for 2 months. Subsequent encounter. COPD EXAM: CHEST  2 VIEW COMPARISON:  03/16/2015 and earlier. FINDINGS: Seated upright AP and lateral views of the chest. 2 generator device is in the upper chest with leads extending cephalad appear stable as before. Stable lung volumes, within normal limits. Normal cardiac size and mediastinal contours. Visualized tracheal air column is within normal limits. No pneumothorax, pulmonary edema, pleural effusion or acute pulmonary opacity. Stable mild increased interstitial markings. Osteopenia. No acute osseous abnormality identified. Stable cholecystectomy clips. IMPRESSION: No acute cardiopulmonary abnormality. Electronically Signed   By: Genevie Ann M.D.   On: 03/20/2015 11:09   I have personally reviewed and evaluated these images as part of my medical decision-making.   EKG Interpretation   Date/Time:  Wednesday March 20 2015 10:04:15 EST Ventricular Rate:  104 PR Interval:  176 QRS Duration: 83 QT Interval:  332 QTC Calculation: 437 R Axis:   77 Text Interpretation:  Sinus tachycardia Borderline T abnormalities,  lateral leads Confirmed by Wilmon Conover  MD, Makalynn Berwanger [phone]) on 03/20/2015  1:34:26 PM      MDM   Final diagnoses:  None     Patient with COPD exacerbation. Patient improved with treatment. Patient prescribed albuterol nebulized treatments.  The chart was scribed for me under my direct supervision.  I personally performed the history, physical, and medical decision making and all procedures in the evaluation of this patient.[name],  MD 03/20/15 [phone]

## 2015-03-20 NOTE — Telephone Encounter (Signed)
Noted  

## 2015-03-20 NOTE — Telephone Encounter (Signed)
Called pt at # provided. Was advised pt went to ED at [facility]. Will make Dr. Annamaria Boots aware.

## 2015-03-20 NOTE — Discharge Instructions (Signed)
Follow up with your md as needed.  Use albuterol neb treatments every 4 hours as needed.  You may keep this medicine in your room

## 2015-03-20 NOTE — Telephone Encounter (Signed)
Please advise 

## 2015-03-20 NOTE — ED Notes (Signed)
Crackers and diet coke given per request.

## 2015-03-20 NOTE — Telephone Encounter (Signed)
changed

## 2015-03-21 ENCOUNTER — Emergency Department (HOSPITAL_COMMUNITY): Payer: Commercial Managed Care - HMO

## 2015-03-21 ENCOUNTER — Telehealth: Payer: Self-pay

## 2015-03-21 ENCOUNTER — Telehealth: Payer: Self-pay | Admitting: Internal Medicine

## 2015-03-21 ENCOUNTER — Emergency Department (HOSPITAL_COMMUNITY)
Admission: EM | Admit: 2015-03-21 | Discharge: 2015-03-21 | Disposition: A | Discharge status: Home / Self Care | Payer: Commercial Managed Care - HMO | Attending: Emergency Medicine | Admitting: Emergency Medicine

## 2015-03-21 ENCOUNTER — Telehealth: Payer: Self-pay | Admitting: Neurology

## 2015-03-21 DIAGNOSIS — Z8739 Personal history of other diseases of the musculoskeletal system and connective tissue: Secondary | ICD-10-CM | POA: Insufficient documentation

## 2015-03-21 DIAGNOSIS — Z79899 Other long term (current) drug therapy: Secondary | ICD-10-CM | POA: Diagnosis not present

## 2015-03-21 DIAGNOSIS — Z91040 Latex allergy status: Secondary | ICD-10-CM | POA: Insufficient documentation

## 2015-03-21 DIAGNOSIS — Z792 Long term (current) use of antibiotics: Secondary | ICD-10-CM | POA: Insufficient documentation

## 2015-03-21 DIAGNOSIS — F419 Anxiety disorder, unspecified: Secondary | ICD-10-CM | POA: Insufficient documentation

## 2015-03-21 DIAGNOSIS — Z7951 Long term (current) use of inhaled steroids: Secondary | ICD-10-CM | POA: Insufficient documentation

## 2015-03-21 DIAGNOSIS — G250 Essential tremor: Secondary | ICD-10-CM | POA: Diagnosis not present

## 2015-03-21 DIAGNOSIS — Z87891 Personal history of nicotine dependence: Secondary | ICD-10-CM | POA: Diagnosis not present

## 2015-03-21 DIAGNOSIS — Z7982 Long term (current) use of aspirin: Secondary | ICD-10-CM | POA: Diagnosis not present

## 2015-03-21 DIAGNOSIS — K219 Gastro-esophageal reflux disease without esophagitis: Secondary | ICD-10-CM | POA: Insufficient documentation

## 2015-03-21 DIAGNOSIS — I10 Essential (primary) hypertension: Secondary | ICD-10-CM | POA: Diagnosis not present

## 2015-03-21 DIAGNOSIS — E039 Hypothyroidism, unspecified: Secondary | ICD-10-CM | POA: Diagnosis not present

## 2015-03-21 DIAGNOSIS — J441 Chronic obstructive pulmonary disease with (acute) exacerbation: Secondary | ICD-10-CM | POA: Diagnosis not present

## 2015-03-21 DIAGNOSIS — E119 Type 2 diabetes mellitus without complications: Secondary | ICD-10-CM | POA: Insufficient documentation

## 2015-03-21 DIAGNOSIS — K5900 Constipation, unspecified: Secondary | ICD-10-CM | POA: Insufficient documentation

## 2015-03-21 DIAGNOSIS — Z794 Long term (current) use of insulin: Secondary | ICD-10-CM | POA: Diagnosis not present

## 2015-03-21 DIAGNOSIS — G8929 Other chronic pain: Secondary | ICD-10-CM | POA: Insufficient documentation

## 2015-03-21 DIAGNOSIS — Z7952 Long term (current) use of systemic steroids: Secondary | ICD-10-CM | POA: Insufficient documentation

## 2015-03-21 DIAGNOSIS — F329 Major depressive disorder, single episode, unspecified: Secondary | ICD-10-CM | POA: Insufficient documentation

## 2015-03-21 DIAGNOSIS — R0602 Shortness of breath: Secondary | ICD-10-CM | POA: Diagnosis present

## 2015-03-21 LAB — CBC
HCT: 37.5 % (ref 36.0–46.0)
Hemoglobin: 12.7 g/dL (ref 12.0–15.0)
MCH: 30.6 pg (ref 26.0–34.0)
MCHC: 33.9 g/dL (ref 30.0–36.0)
MCV: 90.4 fL (ref 78.0–100.0)
Platelets: 169 10*3/uL (ref 150–400)
RBC: 4.15 MIL/uL (ref 3.87–5.11)
RDW: 12.7 % (ref 11.5–15.5)
WBC: 9.3 10*3/uL (ref 4.0–10.5)

## 2015-03-21 LAB — CBG MONITORING, ED: Glucose-Capillary: 279 mg/dL — ABNORMAL HIGH (ref 65–99)

## 2015-03-21 LAB — BASIC METABOLIC PANEL
Anion gap: 10 (ref 5–15)
BUN: 9 mg/dL (ref 6–20)
CO2: 25 mmol/L (ref 22–32)
Calcium: 9.3 mg/dL (ref 8.9–10.3)
Chloride: 97 mmol/L — ABNORMAL LOW (ref 101–111)
Creatinine, Ser: 0.83 mg/dL (ref 0.44–1.00)
GFR calc Af Amer: >60 mL/min (ref >60)
GFR calc non Af Amer: >60 mL/min (ref >60)
Glucose, Bld: 294 mg/dL — ABNORMAL HIGH (ref 65–99)
Potassium: 4.3 mmol/L (ref 3.5–5.1)
Sodium: 132 mmol/L — ABNORMAL LOW (ref 135–145)

## 2015-03-21 MED ORDER — IPRATROPIUM BROMIDE 0.02 % IN SOLN
0.5000 mg | Freq: Once | RESPIRATORY_TRACT | Status: AC
Start: 2015-03-21 — End: 2015-03-21
  Administered 2015-03-21: 0.5 mg via RESPIRATORY_TRACT
  Filled 2015-03-21: qty 2.5

## 2015-03-21 MED ORDER — ALBUTEROL SULFATE (2.5 MG/3ML) 0.083% IN NEBU
5.0000 mg | INHALATION_SOLUTION | Freq: Once | RESPIRATORY_TRACT | Status: AC
  Administered 2015-03-21: 5 mg via RESPIRATORY_TRACT
  Filled 2015-03-21: qty 6

## 2015-03-21 MED ORDER — LEVOFLOXACIN IN D5W 500 MG/100ML IV SOLN
500.0000 mg | Freq: Once | INTRAVENOUS | Status: DC
  Filled 2015-03-21: qty 100

## 2015-03-21 MED ORDER — IPRATROPIUM BROMIDE 0.02 % IN SOLN
0.5000 mg | Freq: Once | RESPIRATORY_TRACT | Status: AC
  Administered 2015-03-21: 0.5 mg via RESPIRATORY_TRACT
  Filled 2015-03-21: qty 2.5

## 2015-03-21 MED ORDER — LEVOFLOXACIN 500 MG PO TABS: 500.0000 mg | ORAL_TABLET | Freq: Every day | ORAL | Status: DC

## 2015-03-21 MED ORDER — LEVOFLOXACIN 500 MG PO TABS
500.0000 mg | ORAL_TABLET | Freq: Once | ORAL | Status: AC
  Administered 2015-03-21: 500 mg via ORAL
  Filled 2015-03-21: qty 1

## 2015-03-21 NOTE — Telephone Encounter (Signed)
Spoke with pt. She was seen in ED at [facility] for asthma exacerbation yesterday. She is requesting an appt to see Dr. Annamaria Boots on 12/21-12/22. Please advise Dr. Annamaria Boots thanks

## 2015-03-21 NOTE — ED Notes (Signed)
Pt arrives from assisted living in [place] reporting SOB.  Pt reports using inhalers,breathing tx, using steroids, without relief.  Pt reports productive cough.  Pt able to speak in full sentences upon arrival.  Resp e/u.

## 2015-03-21 NOTE — Telephone Encounter (Signed)
PA approved for lidocaine patch

## 2015-03-21 NOTE — Telephone Encounter (Signed)
Patient requested Lidocaine patches. Aware this was written by Dr Ronnald Ramp. She will call back if needed.

## 2015-03-21 NOTE — Discharge Instructions (Signed)
Return to the ED with any concerns including difficulty breathing despite using albuterol every 4 hours, not drinking fluids, decreased urine output, vomiting and not able to keep down liquids or medications, decreased level of alertness/lethargy, or any other alarming symptoms °

## 2015-03-21 NOTE — Telephone Encounter (Signed)
Pt decided to go to ER. She has been having to many asthma attacks.

## 2015-03-21 NOTE — Telephone Encounter (Signed)
RX care called regarding insurance needing a PA on lidocaine (LIDODERM) 5 % [id number]

## 2015-03-21 NOTE — Telephone Encounter (Signed)
Currently admitted Will close encounter

## 2015-03-21 NOTE — Telephone Encounter (Signed)
PA initiated via covermymeds. PA for lidocaine patches. [id number]

## 2015-03-21 NOTE — ED Provider Notes (Signed)
CSN: [id number]     Arrival date & time 03/21/15  1422 History   First MD Initiated Contact with Patient 03/21/15 1503     Chief Complaint  Patient presents with  . Shortness of Breath     (Consider location/radiation/quality/duration/timing/severity/associated sxs/prior Treatment) HPI   Pt presenting with c/o wheezing and cough.  She has had symptoms ongoing for the past 4 days. Was seen in the ED 2 times this week for same symptoms.  4 days ago was started on prednisone.  States she has had 2 albuterol nebs today and still feels that she is wheezing.  Low grade fever today of 99.8.  Cough is productive of yellow sputum.  CXR yesterday did not show a pneumonia.  She stats her asthma exacerbations have been more frequent lately.  No chest pain.  No leg swelling.  Has continued to eat and drink normally.  There are no other associated systemic symptoms, there are no other alleviating or modifying factors.   Past Medical History  Diagnosis Date  . 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 [name], residual palpitations  . Vocal cord dysfunction   . Tremor, essential     takes Mysoline and Neurontin daily.  . Allergic rhinitis     uses Nasonex daily as needed  . Low sodium syndrome   . Chronic back pain     reason unknown  . Lumbar radiculopathy   . DDD (degenerative disc disease), lumbar   . Esophageal reflux     takes Zantac daily  . Hypertension     takes Losartan daily  . Anxiety     takes Ativan daily as needed  . Hypothyroidism     takes Synthroid daily  . Depression     takes Zoloft daily as well as Cymbalta  . Diabetes mellitus     takes NOvolin daily  . Asthma     Albuterol inhaler and Neb daily as needed  . History of bronchitis Dec 2014  . Seizures ([place]) 04-05-13    one time ran out of Primidone and had stopped it cold [place]  . Weakness     in left arm  . Joint pain   . Joint swelling     left ankle  . Dysphagia    . Constipation   . Allergy to angiotensin receptor blockers (ARB) 01/22/2014    Angioedema  . 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 Rayann Heman 2007   Past Surgical History  Procedure Laterality Date  . Tonsillectomy    . Right breast cyst      benign  . Left ankle ligament repair      x 2  . Left elbow repair    . Partial hysterectomy    . Cholecystectomy    . Cesarean section    . Ablation for avnrt    . Colonoscopy  01/16/2004    HQI:ONGEXB rectum/colon  . Esophagogastroduodenoscopy (egd) with esophageal dilation  04/03/2002    MWU:XLKGMWNU'U ring, otherwise normal esophagus, status post dilation with 56 F/Normal stomach  . Bartholin gland cyst excision      x 2  . Esophagogastroduodenoscopy (egd) with esophageal dilation N/A 11/08/2012    Dr. [name]:schatzki's ring s/p dilation/hiatal hernia  . Subthalamic stimulator insertion Bilateral 12/04/2013    Procedure: Bilateral Deep brain stimulator placement;  Surgeon: [name], MD;  Location: [place] [facility];  Service: Neurosurgery;  Laterality: Bilateral;  Bilateral Deep  brain stimulator placement  . Pulse generator implant Bilateral 12/15/2013    Procedure: BILATERAL PULSE GENERATOR IMPLANT;  Surgeon: [name], MD;  Location: [place] [facility];  Service: Neurosurgery;  Laterality: Bilateral;  BILATERAL  . Maximum access (mas)posterior lumbar interbody fusion (plif) 1 level N/A 04/24/2014    Procedure: Lumbar four-five Maximum access Surgery posterior lumbar interbody fusion;  Surgeon: [name], MD;  Location: [place] [facility];  Service: Neurosurgery;  Laterality: N/A;  Lumbar four-five Maximum access Surgery posterior lumbar interbody fusion  . Colonoscopy with propofol N/A 03/04/2015    Procedure: COLONOSCOPY WITH PROPOFOL at cecum at 1011; withdrawal time=25mnutes;  Surgeon: RDaneil Dolin MD;  Location: [facility];  Service: Endoscopy;  Laterality: N/A;  . Esophagogastroduodenoscopy (egd) with propofol N/A 03/04/2015    Procedure:  ESOPHAGOGASTRODUODENOSCOPY (EGD) WITH PROPOFOL Procedure #1;  Surgeon: RDaneil Dolin MD;  Location: [facility];  Service: Endoscopy;  Laterality: N/A;  . Esophageal dilation N/A 03/04/2015    Procedure: ESOPHAGEAL DILATION WITH 54FR MALONEY DILATOR;  Surgeon: RDaneil Dolin MD;  Location: [facility];  Service: Endoscopy;  Laterality: N/A;  . Esophageal biopsy  03/04/2015    Procedure: BIOPSY (Duodenal, Gastric);  Surgeon: RDaneil Dolin MD;  Location: [facility];  Service: Endoscopy;;  . Polypectomy  03/04/2015    Procedure: POLYPECTOMY (descending colon);  Surgeon: RDaneil Dolin MD;  Location: [facility];  Service: Endoscopy;;   Family History  Problem Relation Age of Onset  . Lung cancer Father     DIED AGE [id number]  . Alcohol abuse Father   . Anxiety disorder Father   . Depression Father   . COPD Mother     DIED AGE 31,MRSA,COPD,PNEUMONIA  . Pneumonia Mother     DIED AGE 31,MRSA,COPD,PNEUMONIA  . Supraventricular tachycardia Mother   . Anxiety disorder Mother   . Depression Mother   . Alcohol abuse Mother   . COPD Brother     AGE 66 . Heart disease Sister     DIED AGE 66 SMOKER,?HEART  . Colon cancer Neg Hx    Social History  Substance Use Topics  . Smoking status: Former Smoker -- 0.30 packs/day for 10 years    Types: Cigarettes    Quit date: 04/23/1993  . Smokeless tobacco: Never Used     Comment: quit smoking 25+yrs ago  . Alcohol Use: No     Comment: no alcohol in 341yr  OB History    Gravida Para Term Preterm AB TAB SAB Ectopic Multiple Living   '2 2 2       1     ' Review of Systems  ROS reviewed and all otherwise negative except for mentioned in HPI    Allergies  Invokana; Losartan; Metformin and related; Latex; Morphine and related; Other; and Oxycodone-acetaminophen  Home Medications   Prior to Admission medications   Medication Sig Start Date End Date Taking? Authorizing Provider  albuterol (PROVENTIL) (2.5 MG/3ML) 0.083% nebulizer solution Use every 4  hours as needed.  Pt may keep this medicine in her room 03/20/15  Yes JoMilton FergusonMD  ASPIRIN LOW DOSE 81 MG EC tablet TAKE 1 TABLET BY MOUTH ONCE DAILY. 01/12/15  Yes ThJanith LimaMD  buPROPion (WELLBUTRIN XL) 150 MG 24 hr tablet TAKE 1 TABLET BY MOUTH ONCE A DAY. 03/20/15  Yes ThJanith LimaMD  fesoterodine (TOVIAZ) 8 MG TB24 tablet Take 1 tablet (8 mg total) by mouth daily. 12/31/14  Yes Thomas L  Ronnald Ramp, MD  fexofenadine (ALLEGRA) 60 MG tablet Take 1 tablet (60 mg total) by mouth 2 (two) times daily. 02/08/15  Yes Deneise Lever, MD  Fluticasone Furoate-Vilanterol (BREO ELLIPTA) 100-25 MCG/INH AEPB Inhale 1 puff then rinse mouth, once daily Patient taking differently: Inhale 1 puff into the lungs daily. Inhale 1 puff then rinse mouth, once daily 02/08/15  Yes Clinton D Young, MD  gabapentin (NEURONTIN) 600 MG tablet TAKE (1) TABLET BY MOUTH TWICE DAILY. 03/20/15  Yes Rebecca S Tat, DO  HUMALOG KWIKPEN 200 UNIT/ML SOPN INJECT 10 UNITS SUBCUTANEOUSLY 3 TIMES DAILY WITH MEALS. 03/16/15  Yes Janith Lima, MD  Insulin Detemir (LEVEMIR FLEXPEN) 100 UNIT/ML Pen Inject 50 Units into the skin daily at 10 pm. Patient taking differently: Inject 50 Units into the skin every evening. Daily at 8 pm 02/13/15  Yes Janith Lima, MD  lamoTRIgine (LAMICTAL) 25 MG tablet TAKE 1 TABLET BY MOUTH ONCE A DAY. 03/20/15  Yes Janith Lima, MD  levothyroxine (SYNTHROID, LEVOTHROID) 75 MCG tablet TAKE (1) TABLET BY MOUTH ONCE DAILY BEFORE BREAKFAST. 01/12/15  Yes Janith Lima, MD  metoprolol succinate (TOPROL-XL) 50 MG 24 hr tablet TAKE 1 TABLET BY MOUTH ONCE DAILY. 01/12/15  Yes Janith Lima, MD  Omega-3 Fatty Acids (FISH OIL PO) Take 1 capsule by mouth daily.   Yes Historical Provider, MD  pantoprazole (PROTONIX) 40 MG tablet Take 1 tablet (40 mg total) by mouth daily. 02/27/15  Yes Janith Lima, MD  potassium chloride SA (K-DUR,KLOR-CON) 20 MEQ tablet TAKE 1 TABLET BY MOUTH ONCE DAILY. 01/12/15  Yes Janith Lima, MD  predniSONE (DELTASONE) 10 MG tablet Take 4 tablets (40 mg total) by mouth daily. 03/16/15  Yes Fredia Sorrow, MD  primidone (MYSOLINE) 50 MG tablet Take 2 tablets (100 mg total) by mouth 2 (two) times daily. 03/06/15  Yes Rebecca S Tat, DO  QUEtiapine (SEROQUEL) 50 MG tablet TAKE (1) TABLET BY MOUTH AT BEDTIME. 03/20/15  Yes Janith Lima, MD  ranitidine (ZANTAC) 150 MG tablet Take 1 tablet (150 mg total) by mouth at bedtime. 03/12/15  Yes Orvil Feil, NP  sucralfate (CARAFATE) 1 G tablet TAKE 1 TABLET BY MOUTH FOUR TIMES A DAY. 03/20/15  Yes Janith Lima, MD  triamterene-hydrochlorothiazide (MAXZIDE-25) 37.5-25 MG tablet TAKE 1 TABLET BY MOUTH ONCE DAILY. 01/12/15  Yes Janith Lima, MD  Adhesive Bandages (PLASTIC BANDAGES 3/4") MISC  03/08/15   Historical Provider, MD  bisacodyl (DULCOLAX) 10 MG suppository Place 1 suppository (10 mg total) rectally as needed for moderate constipation. 02/25/15   Janith Lima, MD  HYDROcodone-acetaminophen (NORCO) 10-325 MG tablet Take 1 tablet by mouth every 6 (six) hours as needed for moderate pain. 03/19/15   Janith Lima, MD  levofloxacin (LEVAQUIN) 500 MG tablet Take 1 tablet (500 mg total) by mouth daily. 03/21/15   Alfonzo Beers, MD  lidocaine (LIDODERM) 5 % Place 1 patch onto the skin daily. Remove & Discard patch within 12 hours or as directed by MD 03/20/15   Janith Lima, MD  loperamide (IMODIUM) 2 MG capsule Take 1 capsule (2 mg total) by mouth as needed for diarrhea or loose stools. 02/13/15   Janith Lima, MD  miconazole (MONISTAT 1 COMBINATION PACK) kit Use as needed 03/19/15   Janith Lima, MD  neomycin-polymyxin-pramoxine (NEOSPORIN PLUS) 1 % cream Apply topically as needed. 03/19/15   Janith Lima, MD  nystatin (MYCOSTATIN) 100000 UNIT/ML suspension Take  5 mLs (500,000 Units total) by mouth 4 (four) times daily as needed. 03/19/15   Janith Lima, MD  nystatin cream (MYCOSTATIN) Apply 1 application topically 2 (two) times  daily as needed for dry skin. 03/19/15   Janith Lima, MD  ondansetron (ZOFRAN ODT) 8 MG disintegrating tablet Take 1 tablet (8 mg total) by mouth every 8 (eight) hours as needed for nausea or vomiting. 11/24/14   Evalee Jefferson, PA-C  polyethylene glycol [facility]) packet Take 17 g by mouth daily as needed for mild constipation. 03/19/15   Janith Lima, MD  promethazine-codeine (PHENERGAN WITH CODEINE) 6.25-10 MG/5ML syrup Take 5 mLs by mouth every 6 (six) hours as needed for cough. 03/19/15   Deneise Lever, MD  Respiratory Therapy Supplies (FLUTTER) DEVI Use as directed 01/18/15   Tanda Rockers, MD  Respiratory Therapy Supplies (FLUTTER) DEVI Blow through 4 times per set and repeat 3 sets per day while needed for chest congestion J45.20 03/05/15   Deneise Lever, MD  Sanitary Napkins & Tampons (EQ MAXI OVERNIGHT EXTRA HEAVY) PADS Use as needed 02/27/15   Janith Lima, MD  sodium chloride (OCEAN) 0.65 % SOLN nasal spray 1-2 sprays each nostril up to 4 times daily as needed 02/08/15   Deneise Lever, MD  tiZANidine (ZANAFLEX) 4 MG tablet TAKE 1 TABLET BY MOUTH AT BEDTIME AS NEEDED FOR MUSCLE SPASMS. 03/19/15   Janith Lima, MD   BP 120/65 mmHg  Pulse 113  Resp 15  SpO2 95%  Vitals reviewed Physical Exam  Physical Examination: General appearance - alert, well appearing, and in no distress Mental status - alert, oriented to person, place, and time Eyes - no conjunctival injection, no scleral icterus Mouth - mucous membranes moist, pharynx normal without lesions Chest - BSS, bilateral coarse wheezing and transmitted upper airway sounds, normal respiratory effort, Heart - normal rate, regular rhythm, normal S1, S2, no murmurs, rubs, clicks or gallops Abdomen - soft, nontender, nondistended, no masses or organomegaly Neurological - alert, oriented x 3, halting speech c/w her vocal cord dysfunction Extremities - peripheral pulses normal, no pedal edema, no clubbing or cyanosis Skin - normal  coloration and turgor, no rashes  ED Course  Procedures (including critical care time) Labs Review Labs Reviewed  BASIC METABOLIC PANEL - Abnormal; Notable for the following:    Sodium 132 (*)    Chloride 97 (*)    Glucose, Bld 294 (*)    All other components within normal limits  CBG MONITORING, ED - Abnormal; Notable for the following:    Glucose-Capillary 279 (*)    All other components within normal limits  CBC    Imaging Review Dg Chest 2 View  03/21/2015  CLINICAL DATA:  Productive cough and wheezing with fever EXAM: CHEST - 2 VIEW COMPARISON:  03/20/2015 FINDINGS: The heart size and mediastinal contours are within normal limits. Both lungs are clear. The visualized skeletal structures are unremarkable. Stimulating devices are again seen. IMPRESSION: No active disease. Electronically Signed   By: Inez Catalina M.D.   On: 03/21/2015 15:43   Dg Chest 2 View  03/20/2015  CLINICAL DATA:  64 year old female with shortness of breath and productive cough for 2 months. Subsequent encounter. COPD EXAM: CHEST  2 VIEW COMPARISON:  03/16/2015 and earlier. FINDINGS: Seated upright AP and lateral views of the chest. 2 generator device is in the upper chest with leads extending cephalad appear stable as before. Stable lung volumes, within normal limits. Normal  cardiac size and mediastinal contours. Visualized tracheal air column is within normal limits. No pneumothorax, pulmonary edema, pleural effusion or acute pulmonary opacity. Stable mild increased interstitial markings. Osteopenia. No acute osseous abnormality identified. Stable cholecystectomy clips. IMPRESSION: No acute cardiopulmonary abnormality. Electronically Signed   By: Genevie Ann M.D.   On: 03/20/2015 11:09   I have personally reviewed and evaluated these images and lab results as part of my medical decision-making.   EKG Interpretation None      MDM   Final diagnoses:  COPD exacerbation ([place])    Pt presenting with c/o  continued wheezing and cough.  Has been using albuterol, today is 4th day of prednisone.  She is not in any distress, but does have some ongoing wheezing which improved after 2 duonebs in the ED.  CXR again reassuring.  Pt started on levaquin in case of developing pneumonia.  She is well appearing, not hypoxic with normal respiratory effort.  She is working on getting an appointment with her pulmonologist, Dr. Annamaria Boots.  Discharged with strict return precautions.  Pt agreeable with plan.   Alfonzo Beers, MD 03/21/15 [phone]

## 2015-03-21 NOTE — Telephone Encounter (Signed)
Pt called to request a back pn med refill/ call back @ [phone]

## 2015-03-22 ENCOUNTER — Ambulatory Visit: Payer: Self-pay | Admitting: Internal Medicine

## 2015-03-22 ENCOUNTER — Telehealth: Payer: Self-pay | Admitting: Internal Medicine

## 2015-03-22 ENCOUNTER — Emergency Department (HOSPITAL_COMMUNITY)
Admission: EM | Admit: 2015-03-22 | Discharge: 2015-03-22 | Disposition: A | Discharge status: Home / Self Care | Payer: Commercial Managed Care - HMO | Attending: Emergency Medicine | Admitting: Emergency Medicine

## 2015-03-22 ENCOUNTER — Encounter (HOSPITAL_COMMUNITY): Payer: Self-pay | Admitting: Emergency Medicine

## 2015-03-22 ENCOUNTER — Telehealth: Payer: Self-pay

## 2015-03-22 DIAGNOSIS — E039 Hypothyroidism, unspecified: Secondary | ICD-10-CM | POA: Insufficient documentation

## 2015-03-22 DIAGNOSIS — G8929 Other chronic pain: Secondary | ICD-10-CM | POA: Insufficient documentation

## 2015-03-22 DIAGNOSIS — Z87891 Personal history of nicotine dependence: Secondary | ICD-10-CM | POA: Insufficient documentation

## 2015-03-22 DIAGNOSIS — F419 Anxiety disorder, unspecified: Secondary | ICD-10-CM | POA: Diagnosis not present

## 2015-03-22 DIAGNOSIS — K219 Gastro-esophageal reflux disease without esophagitis: Secondary | ICD-10-CM | POA: Insufficient documentation

## 2015-03-22 DIAGNOSIS — F329 Major depressive disorder, single episode, unspecified: Secondary | ICD-10-CM | POA: Insufficient documentation

## 2015-03-22 DIAGNOSIS — I10 Essential (primary) hypertension: Secondary | ICD-10-CM | POA: Insufficient documentation

## 2015-03-22 DIAGNOSIS — J45901 Unspecified asthma with (acute) exacerbation: Secondary | ICD-10-CM | POA: Insufficient documentation

## 2015-03-22 DIAGNOSIS — J45909 Unspecified asthma, uncomplicated: Secondary | ICD-10-CM | POA: Insufficient documentation

## 2015-03-22 DIAGNOSIS — J4541 Moderate persistent asthma with (acute) exacerbation: Secondary | ICD-10-CM

## 2015-03-22 DIAGNOSIS — R0602 Shortness of breath: Secondary | ICD-10-CM | POA: Diagnosis present

## 2015-03-22 DIAGNOSIS — Z91040 Latex allergy status: Secondary | ICD-10-CM | POA: Insufficient documentation

## 2015-03-22 DIAGNOSIS — Z79899 Other long term (current) drug therapy: Secondary | ICD-10-CM | POA: Diagnosis not present

## 2015-03-22 DIAGNOSIS — E119 Type 2 diabetes mellitus without complications: Secondary | ICD-10-CM | POA: Insufficient documentation

## 2015-03-22 DIAGNOSIS — Z794 Long term (current) use of insulin: Secondary | ICD-10-CM | POA: Insufficient documentation

## 2015-03-22 LAB — CBC WITH DIFFERENTIAL/PLATELET
Basophils Absolute: 0 10*3/uL (ref 0.0–0.1)
Basophils Relative: 0 %
Eosinophils Absolute: 0 10*3/uL (ref 0.0–0.7)
Eosinophils Relative: 0 %
HCT: 37.5 % (ref 36.0–46.0)
Hemoglobin: 12.7 g/dL (ref 12.0–15.0)
Lymphocytes Relative: 6 %
Lymphs Abs: 0.6 10*3/uL — ABNORMAL LOW (ref 0.7–4.0)
MCH: 31 pg (ref 26.0–34.0)
MCHC: 33.9 g/dL (ref 30.0–36.0)
MCV: 91.5 fL (ref 78.0–100.0)
Monocytes Absolute: 0.2 10*3/uL (ref 0.1–1.0)
Monocytes Relative: 2 %
Neutro Abs: 8.9 10*3/uL — ABNORMAL HIGH (ref 1.7–7.7)
Neutrophils Relative %: 92 %
Platelets: 166 10*3/uL (ref 150–400)
RBC: 4.1 MIL/uL (ref 3.87–5.11)
RDW: 12.8 % (ref 11.5–15.5)
WBC: 9.8 10*3/uL (ref 4.0–10.5)

## 2015-03-22 LAB — BASIC METABOLIC PANEL
Anion gap: 11 (ref 5–15)
BUN: 8 mg/dL (ref 6–20)
CO2: 24 mmol/L (ref 22–32)
Calcium: 9.3 mg/dL (ref 8.9–10.3)
Chloride: 96 mmol/L — ABNORMAL LOW (ref 101–111)
Creatinine, Ser: 0.69 mg/dL (ref 0.44–1.00)
GFR calc Af Amer: >60 mL/min (ref >60)
GFR calc non Af Amer: >60 mL/min (ref >60)
Glucose, Bld: 284 mg/dL — ABNORMAL HIGH (ref 65–99)
Potassium: 4.1 mmol/L (ref 3.5–5.1)
Sodium: 131 mmol/L — ABNORMAL LOW (ref 135–145)

## 2015-03-22 MED ORDER — MAGNESIUM SULFATE 2 GM/50ML IV SOLN
2.0000 g | Freq: Once | INTRAVENOUS | Status: AC
  Administered 2015-03-22: 2 g via INTRAVENOUS
  Filled 2015-03-22: qty 50

## 2015-03-22 MED ORDER — IPRATROPIUM-ALBUTEROL 0.5-2.5 (3) MG/3ML IN SOLN
3.0000 mL | Freq: Once | RESPIRATORY_TRACT | Status: AC
  Administered 2015-03-22: 3 mL via RESPIRATORY_TRACT
  Filled 2015-03-22: qty 3

## 2015-03-22 MED ORDER — SODIUM CHLORIDE 0.9 % IV BOLUS (SEPSIS)
500.0000 mL | Freq: Once | INTRAVENOUS | Status: AC
  Administered 2015-03-22: 500 mL via INTRAVENOUS

## 2015-03-22 NOTE — Telephone Encounter (Signed)
PA initiated via covermymeds for lidocaine 5% patches. PA key is [id number]

## 2015-03-22 NOTE — Assessment & Plan Note (Signed)
I again reviewed reflux precautions

## 2015-03-22 NOTE — Telephone Encounter (Signed)
Pt went to ED 12/14 and 12/15. She is now wanting a follow up with Dr. Annamaria Boots next week. Please advise thanks

## 2015-03-22 NOTE — Discharge Instructions (Signed)
Read the information below.  Use the prescribed medication as directed.  You may return to the Emergency Department at any time for worsening condition or any new symptoms that concern you.  Please follow up with your pulmonologist.  Take the Levaquin as prescribed and use your breathing treatments at home as needed for relief.  If you develop worsening shortness of breath, uncontrolled wheezing, severe chest pain, or high fevers return for a recheck.       Asthma, Adult Asthma is a condition of the lungs in which the airways tighten and narrow. Asthma can make it hard to breathe. Asthma cannot be cured, but medicine and lifestyle changes can help control it. Asthma may be started (triggered) by:  Animal skin flakes (dander).  Dust.  Cockroaches.  Pollen.  Mold.  Smoke.  Cleaning products.  Hair sprays or aerosol sprays.  Paint fumes or strong smells.  Cold air, weather changes, and winds.  Crying or laughing hard.  Stress.  Certain medicines or drugs.  Foods, such as dried fruit, potato chips, and sparkling grape juice.  Infections or conditions (colds, flu).  Exercise.  Certain medical conditions or diseases.  Exercise or tiring activities. HOME CARE   Take medicine as told by your doctor.  Use a peak flow meter as told by your doctor. A peak flow meter is a tool that measures how well the lungs are working.  Record and keep track of the peak flow meter's readings.  Understand and use the asthma action plan. An asthma action plan is a written plan for taking care of your asthma and treating your attacks.  To help prevent asthma attacks:  Do not smoke. Stay away from secondhand smoke.  Change your heating and air conditioning filter often.  Limit your use of fireplaces and wood stoves.  Get rid of pests (such as roaches and mice) and their droppings.  Throw away plants if you see mold on them.  Clean your floors. Dust regularly. Use cleaning products  that do not smell.  Have someone vacuum when you are not home. Use a vacuum cleaner with a HEPA filter if possible.  Replace carpet with wood, tile, or vinyl flooring. Carpet can trap animal skin flakes and dust.  Use allergy-proof pillows, mattress covers, and box spring covers.  Wash bed sheets and blankets every week in hot water and dry them in a dryer.  Use blankets that are made of polyester or cotton.  Clean bathrooms and kitchens with bleach. If possible, have someone repaint the walls in these rooms with mold-resistant paint. Keep out of the rooms that are being cleaned and painted.  Wash hands often. GET HELP IF:  You have make a whistling sound when breaking (wheeze), have shortness of breath, or have a cough even if taking medicine to prevent attacks.  The colored mucus you cough up (sputum) is thicker than usual.  The colored mucus you cough up changes from clear or white to yellow, green, gray, or bloody.  You have problems from the medicine you are taking such as:  A rash.  Itching.  Swelling.  Trouble breathing.  You need reliever medicines more than 2-3 times a week.  Your peak flow measurement is still at 50-79% of your personal best after following the action plan for 1 hour.  You have a fever. GET HELP RIGHT AWAY IF:   You seem to be worse and are not responding to medicine during an asthma attack.  You are short of  breath even at rest.  You get short of breath when doing very little activity.  You have trouble eating, drinking, or talking.  You have chest pain.  You have a fast heartbeat.  Your lips or fingernails start to turn blue.  You are light-headed, dizzy, or faint.  Your peak flow is less than 50% of your personal best.   This information is not intended to replace advice given to you by your health care provider. Make sure you discuss any questions you have with your health care provider.   Document Released: 09/09/2007  Document Revised: 12/12/2014 Document Reviewed: 10/20/2012 Elsevier Interactive Patient Education [employer].

## 2015-03-22 NOTE — ED Notes (Signed)
Pt seen yesterday for COPD exaserbation. Pt still feeling SOB- "feels tight in her throat". CBG 294, sats upper 90's on room air. 2.5mg  abuterol and duoneb, 125mg  solu medrol, given by EMS. BP 143/75, HR 103

## 2015-03-22 NOTE — ED Provider Notes (Signed)
CSN: [id number]     Arrival date & time 03/22/15  1113 History   First MD Initiated Contact with Patient 03/22/15 1132     Chief Complaint  Patient presents with  . Shortness of Breath     (Consider location/radiation/quality/duration/timing/severity/associated sxs/prior Treatment) HPI   Pt with hx asthma, AV node reentry tachycardia s/p ablation with 20 ED visits in 6 months, including 4 in the past week p/w SOB, wheezing, chest tightness that worsened this morning around 4:30am.  She has used her albuterol inhaler twice, albuterol nebs x 2, Breo inhaler, 80m prednisone (5th dose out of 5), took first dose of Levaquin last night.  Cough has been productive of green sputum.   Has been having trouble with wheezing and SOB since October when her assisted living began using Clorox to clean.  Denies fevers, leg swelling.  Denies any other concerns at this time.   Has seen her PCP Dr TScarlette Calicoand her pulmonologist Dr YAnnamaria Bootsas well as multiple ED visits for similar symptoms.  Last ED visit was yesterday.  She is not on home O2, states her O2 is usually in the 90s on room air.   Past Medical History  Diagnosis Date  . [place] nodal re[place] tachy[place]     s/p slow pathway ablation, 11/09, by Dr. JThompson Grayer residual palpitations  . Vocal cord dysfunction   . Tremor, essential     takes Mysoline and Neurontin daily.  . Allergic rhinitis     uses Nasonex daily as needed  . Low sodium syndrome   . Chronic back pain     reason unknown  . Lumbar radiculopathy   . DDD (degenerative disc disease), lumbar   . Esophageal reflux     takes Zantac daily  . Hypertension     takes Losartan daily  . Anxiety     takes Ativan daily as needed  . Hypothyroidism     takes Synthroid daily  . Depression     takes Zoloft daily as well as Cymbalta  . Diabetes mellitus     takes NOvolin daily  . Asthma     Albuterol inhaler and Neb daily as needed  . History of bronchitis Dec 2014  . Seizures  ([place]    one time ran out of Primidone and had stopped it cold [place] . Weakness     in left arm  . Joint pain   . Joint swelling     left ankle  . Dysphagia   . Constipation   . Allergy to angiotensin receptor blockers (ARB) 01/22/2014    Angioedema  . 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 ARayann Heman2007   Past Surgical History  Procedure Laterality Date  . Tonsillectomy    . Right breast cyst      benign  . Left ankle ligament repair      x 2  . Left elbow repair    . Partial hysterectomy    . Cholecystectomy    . Cesarean section    . Ablation for avnrt    . Colonoscopy  01/16/2004    RWUJ:WJXBJYrectum/colon  . Esophagogastroduodenoscopy (egd) with esophageal dilation  04/03/2002    RNWG:NFAOZHYQ'Mring, otherwise normal esophagus, status post dilation with 54 F/Normal stomach  . Bartholin gland cyst excision      x 2  . Esophagogastroduodenoscopy (egd) with esophageal dilation N/A 11/08/2012    Dr. [name]:schatzki's ring s/p dilation/hiatal hernia  . Subthalamic  stimulator insertion Bilateral 12/04/2013    Procedure: Bilateral Deep brain stimulator placement;  Surgeon: [name], MD;  Location: [place] [facility];  Service: Neurosurgery;  Laterality: Bilateral;  Bilateral Deep brain stimulator placement  . Pulse generator implant Bilateral 12/15/2013    Procedure: BILATERAL PULSE GENERATOR IMPLANT;  Surgeon: [name], MD;  Location: [place] [facility];  Service: Neurosurgery;  Laterality: Bilateral;  BILATERAL  . Maximum access (mas)posterior lumbar interbody fusion (plif) 1 level N/A 04/24/2014    Procedure: Lumbar four-five Maximum access Surgery posterior lumbar interbody fusion;  Surgeon: [name], MD;  Location: [place] [facility];  Service: Neurosurgery;  Laterality: N/A;  Lumbar four-five Maximum access Surgery posterior lumbar interbody fusion  . Colonoscopy with propofol N/A 03/04/2015    Procedure: COLONOSCOPY WITH PROPOFOL at cecum at 1011; withdrawal  time=71mnutes;  Surgeon: RDaneil Dolin MD;  Location: [facility];  Service: Endoscopy;  Laterality: N/A;  . Esophagogastroduodenoscopy (egd) with propofol N/A 03/04/2015    Procedure: ESOPHAGOGASTRODUODENOSCOPY (EGD) WITH PROPOFOL Procedure #1;  Surgeon: RDaneil Dolin MD;  Location: [facility];  Service: Endoscopy;  Laterality: N/A;  . Esophageal dilation N/A 03/04/2015    Procedure: ESOPHAGEAL DILATION WITH 54FR MALONEY DILATOR;  Surgeon: RDaneil Dolin MD;  Location: [facility];  Service: Endoscopy;  Laterality: N/A;  . Esophageal biopsy  03/04/2015    Procedure: BIOPSY (Duodenal, Gastric);  Surgeon: RDaneil Dolin MD;  Location: [facility];  Service: Endoscopy;;  . Polypectomy  03/04/2015    Procedure: POLYPECTOMY (descending colon);  Surgeon: RDaneil Dolin MD;  Location: [facility];  Service: Endoscopy;;   Family History  Problem Relation Age of Onset  . Lung cancer Father     DIED AGE [id number] CA  . Alcohol abuse Father   . Anxiety disorder Father   . Depression Father   . COPD Mother     DIED AGE 42,MRSA,COPD,PNEUMONIA  . Pneumonia Mother     DIED AGE 42,MRSA,COPD,PNEUMONIA  . Supraventricular tachycardia Mother   . Anxiety disorder Mother   . Depression Mother   . Alcohol abuse Mother   . COPD Brother     AGE 66 . Heart disease Sister     DIED AGE 66 SMOKER,?HEART  . Colon cancer Neg Hx    Social History  Substance Use Topics  . Smoking status: Former Smoker -- 0.30 packs/day for 10 years    Types: Cigarettes    Quit date: 04/23/1993  . Smokeless tobacco: Never Used     Comment: quit smoking 25+yrs ago  . Alcohol Use: No     Comment: no alcohol in 382yr  OB History    Gravida Para Term Preterm AB TAB SAB Ectopic Multiple Living   _0 Review of Systems  All other systems reviewed and are negative.     Allergies  Invokana; Losartan; Metformin and related; Latex; Morphine and related; Other; and Oxycodone-acetaminophen  Home Medications   Prior to  Admission medications   Medication Sig Start Date End Date Taking? Authorizing Provider  Adhesive Bandages (PLASTIC BANDAGES 3/4") MISC  03/08/15   Historical Provider, MD  albuterol (PROVENTIL) (2.5 MG/3ML) 0.083% nebulizer solution Use every 4 hours as needed.  Pt may keep this medicine in her room 03/20/15   JoMilton FergusonMD  ASPIRIN LOW DOSE 81 MG EC tablet TAKE 1 TABLET BY MOUTH ONCE DAILY. 01/12/15   ThJanith LimaMD  bisacodyl (  DULCOLAX) 10 MG suppository Place 1 suppository (10 mg total) rectally as needed for moderate constipation. 02/25/15   Janith Lima, MD  buPROPion (WELLBUTRIN XL) 150 MG 24 hr tablet TAKE 1 TABLET BY MOUTH ONCE A DAY. 03/20/15   Janith Lima, MD  fesoterodine (TOVIAZ) 8 MG TB24 tablet Take 1 tablet (8 mg total) by mouth daily. 12/31/14   Janith Lima, MD  fexofenadine (ALLEGRA) 60 MG tablet Take 1 tablet (60 mg total) by mouth 2 (two) times daily. 02/08/15   Deneise Lever, MD  Fluticasone Furoate-Vilanterol (BREO ELLIPTA) 100-25 MCG/INH AEPB Inhale 1 puff then rinse mouth, once daily Patient taking differently: Inhale 1 puff into the lungs daily. Inhale 1 puff then rinse mouth, once daily 02/08/15   Deneise Lever, MD  gabapentin (NEURONTIN) 600 MG tablet TAKE (1) TABLET BY MOUTH TWICE DAILY. 03/20/15   Rebecca S Tat, DO  HUMALOG KWIKPEN 200 UNIT/ML SOPN INJECT 10 UNITS SUBCUTANEOUSLY 3 TIMES DAILY WITH MEALS. 03/16/15   Janith Lima, MD  HYDROcodone-acetaminophen (NORCO) 10-325 MG tablet Take 1 tablet by mouth every 6 (six) hours as needed for moderate pain. 03/19/15   Janith Lima, MD  Insulin Detemir (LEVEMIR FLEXPEN) 100 UNIT/ML Pen Inject 50 Units into the skin daily at 10 pm. Patient taking differently: Inject 50 Units into the skin every evening. Daily at 8 pm 02/13/15   Janith Lima, MD  lamoTRIgine (LAMICTAL) 25 MG tablet TAKE 1 TABLET BY MOUTH ONCE A DAY. 03/20/15   Janith Lima, MD  levofloxacin (LEVAQUIN) 500 MG tablet Take 1 tablet (500 mg  total) by mouth daily. 03/21/15   Alfonzo Beers, MD  levothyroxine (SYNTHROID, LEVOTHROID) 75 MCG tablet TAKE (1) TABLET BY MOUTH ONCE DAILY BEFORE BREAKFAST. 01/12/15   Janith Lima, MD  lidocaine (LIDODERM) 5 % Place 1 patch onto the skin daily. Remove & Discard patch within 12 hours or as directed by MD 03/20/15   Janith Lima, MD  loperamide (IMODIUM) 2 MG capsule Take 1 capsule (2 mg total) by mouth as needed for diarrhea or loose stools. 02/13/15   Janith Lima, MD  metoprolol succinate (TOPROL-XL) 50 MG 24 hr tablet TAKE 1 TABLET BY MOUTH ONCE DAILY. 01/12/15   Janith Lima, MD  miconazole (MONISTAT 1 COMBINATION PACK) kit Use as needed 03/19/15   Janith Lima, MD  neomycin-polymyxin-pramoxine (NEOSPORIN PLUS) 1 % cream Apply topically as needed. 03/19/15   Janith Lima, MD  nystatin (MYCOSTATIN) 100000 UNIT/ML suspension Take 5 mLs (500,000 Units total) by mouth 4 (four) times daily as needed. 03/19/15   Janith Lima, MD  nystatin cream (MYCOSTATIN) Apply 1 application topically 2 (two) times daily as needed for dry skin. 03/19/15   Janith Lima, MD  Omega-3 Fatty Acids (FISH OIL PO) Take 1 capsule by mouth daily.    Historical Provider, MD  ondansetron (ZOFRAN ODT) 8 MG disintegrating tablet Take 1 tablet (8 mg total) by mouth every 8 (eight) hours as needed for nausea or vomiting. 11/24/14   Evalee Jefferson, PA-C  pantoprazole (PROTONIX) 40 MG tablet Take 1 tablet (40 mg total) by mouth daily. 02/27/15   Janith Lima, MD  polyethylene glycol [facility]) packet Take 17 g by mouth daily as needed for mild constipation. 03/19/15   Janith Lima, MD  potassium chloride SA (K-DUR,KLOR-CON) 20 MEQ tablet TAKE 1 TABLET BY MOUTH ONCE DAILY. 01/12/15   Janith Lima, MD  predniSONE (DELTASONE)  10 MG tablet Take 4 tablets (40 mg total) by mouth daily. 03/16/15   Fredia Sorrow, MD  primidone (MYSOLINE) 50 MG tablet Take 2 tablets (100 mg total) by mouth 2 (two) times daily. 03/06/15   Eustace Quail Tat, DO  promethazine-codeine (PHENERGAN WITH CODEINE) 6.25-10 MG/5ML syrup Take 5 mLs by mouth every 6 (six) hours as needed for cough. 03/19/15   Deneise Lever, MD  QUEtiapine (SEROQUEL) 50 MG tablet TAKE (1) TABLET BY MOUTH AT BEDTIME. 03/20/15   Janith Lima, MD  ranitidine (ZANTAC) 150 MG tablet Take 1 tablet (150 mg total) by mouth at bedtime. 03/12/15   Orvil Feil, NP  Respiratory Therapy Supplies (FLUTTER) DEVI Use as directed 01/18/15   Tanda Rockers, MD  Respiratory Therapy Supplies (FLUTTER) DEVI Blow through 4 times per set and repeat 3 sets per day while needed for chest congestion J45.20 03/05/15   Deneise Lever, MD  Sanitary Napkins & Tampons (EQ MAXI OVERNIGHT EXTRA HEAVY) PADS Use as needed 02/27/15   Janith Lima, MD  sodium chloride (OCEAN) 0.65 % SOLN nasal spray 1-2 sprays each nostril up to 4 times daily as needed 02/08/15   Deneise Lever, MD  sucralfate (CARAFATE) 1 G tablet TAKE 1 TABLET BY MOUTH FOUR TIMES A DAY. 03/20/15   Janith Lima, MD  tiZANidine (ZANAFLEX) 4 MG tablet TAKE 1 TABLET BY MOUTH AT BEDTIME AS NEEDED FOR MUSCLE SPASMS. 03/19/15   Janith Lima, MD  triamterene-hydrochlorothiazide (MAXZIDE-25) 37.5-25 MG tablet TAKE 1 TABLET BY MOUTH ONCE DAILY. 01/12/15   Janith Lima, MD   BP 132/85 mmHg  Pulse 102  Temp(Src) 98.4 F (36.9 C) (Oral)  Resp 20  Ht _0  (1.626 m)  Wt 74.39 kg  BMI 28.14 kg/m2  SpO2 97% Physical Exam  Constitutional: She appears well-developed and well-nourished. No distress.  HENT:  Head: Normocephalic and atraumatic.  Neck: Neck supple.  Cardiovascular: Normal rate and regular rhythm.   Pulmonary/Chest: Effort normal. No respiratory distress. She has wheezes. She has no rales.  Diffuse expiratory wheezing  Abdominal: Soft. She exhibits no distension. There is no tenderness. There is no rebound and no guarding.  Musculoskeletal: She exhibits no edema.  Neurological: She is alert.  Skin: She is not diaphoretic.   Nursing note and vitals reviewed.   ED Course  Procedures (including critical care time) Labs Review Labs Reviewed  BASIC METABOLIC PANEL - Abnormal; Notable for the following:    Sodium 131 (*)    Chloride 96 (*)    Glucose, Bld 284 (*)    All other components within normal limits  CBC WITH DIFFERENTIAL/PLATELET - Abnormal; Notable for the following:    Neutro Abs 8.9 (*)    Lymphs Abs 0.6 (*)    All other components within normal limits    Imaging Review Dg Chest 2 View  03/21/2015  CLINICAL DATA:  Productive cough and wheezing with fever EXAM: CHEST - 2 VIEW COMPARISON:  03/20/2015 FINDINGS: The heart size and mediastinal contours are within normal limits. Both lungs are clear. The visualized skeletal structures are unremarkable. Stimulating devices are again seen. IMPRESSION: No active disease. Electronically Signed   By: Inez Catalina M.D.   On: 03/21/2015 15:43   I have personally reviewed and evaluated these images and lab results as part of my medical decision-making.   EKG Interpretation None       ED ECG REPORT   Date: 03/22/2015  Rate: 103  Rhythm: sinus tachycardia  QRS Axis: normal  Intervals: normal  ST/T Wave abnormalities: nonspecific T wave changes  Conduction Disutrbances:none  Narrative Interpretation:   Old EKG Reviewed: none available  I have personally reviewed the EKG tracing and agree with the computerized printout as noted.   1:35 PM Pt reports improvement.  She now has very mild end expiratory wheezes only with forced expiration.  Pt discussed with Dr Lita Mains who will also see the patient.    MDM   Final diagnoses:  Asthma exacerbation attacks, moderate persistent    Afebrile nontoxic patient with asthma exacerbation presenting with continued symptoms.  Has been seen in ED 4 times in the past week.   She was started on Levaquin last night (has taken single dose).  Finished last dose of 5 day prednisone course today.  Nebs, IV  solu-medrol, IV magnesium given with improvement.  Recent pulmonology notes reviewed from visit 3 days ago indicating patient's asthma has potential emotional/stress and GERD components.  Pt also has close follow up with Dr Annamaria Boots (pulmonology) scheduled and they have followed her recent ED visits and prescriptions, per notes recommend continuing with Levaquin, no new steroids. Pt feeling much better after treatment in ED.  Discussed pt with Dr Lita Mains. Pt has had 3 CXR this week and I did not repeat this given patient's history, physical, and quick improvement with treatment.  D/C home.  Discussed result, findings, treatment, and follow up  with patient.  Pt given return precautions.  Pt verbalizes understanding and agrees with plan.        Clayton Bibles, PA-C 03/22/15 1447  Julianne Rice, MD 03/26/15 [phone]

## 2015-03-22 NOTE — Assessment & Plan Note (Signed)
Insurance denied coverage for an unattended home sleep test. We will need to reschedule her for an attended overnight sleep study.

## 2015-03-22 NOTE — Telephone Encounter (Signed)
Pt's nurse at adult [facility]) calling back Spoke with [place] who reported pt's breathing has progressively worsened x4 days with increased SOB, wheezing, tightness, prod cough with thick green mucus.  Denies any f/c/s, hemoptysis, head congestion, PND.  Dessie reported pt went to [facility] ED on 12/13 and [facility] ED on 12/14.  Nebs were given and Levaquin 500mg  x9days yesterday.  Asked Dessie if prednisone was given and she reported "No."  Levaquin has not yet been started but [place] stated will fax rx as soon as the pharmacy opens today.  Advised Dessie will speak with CY directly and if pt's breathing worsens prior to speaking again, to call 911.  Dessie voiced her understanding.  [phone] or [phone]  Discussed with CY: okay to start prednisone 20mg  QD for 3 days and offer ov any day next week in hold slot.  Called spoke with [facility] and discussed CY's recs.  Dessie then reported that pt is on prednisone as of 12/13 - rx'd at [facility] ED for 40mg  QD x5 days.  Advised Dessie will need to speak with CY again and will call her back.    Spoke with CY: unable to give more prednisone, would recommend filling the levaquin ASAP and scheduling visit for next week.  Called spoke with [facility] who reported pt had her call 911 again and they are there now.  Pt is being taken to [facility] ED.  Appt scheduled with CDY for 12.19.16 @ 1115.  Dessie did mention concern about pt's mental stability and I informed her I would let CDY know of this new development.  Nothing further needed at this time; will sign off.

## 2015-03-22 NOTE — Telephone Encounter (Signed)
Calling back about appointment and worsening breathing.

## 2015-03-22 NOTE — Telephone Encounter (Signed)
[phone], pt cb to apologize states she just really needs to be seen on 12/22 (FYI)as not able to get number earlier due to patient hanging up in my face

## 2015-03-22 NOTE — Telephone Encounter (Signed)
I called pt and was advised by nurse pt was not in as she was still at the ED. WCB

## 2015-03-22 NOTE — Assessment & Plan Note (Signed)
Much of this asthma/bronchitis pattern appears associated with anxiety, emotional stress and probably some reflux. I talked with her again about management using her routine meds without heading straight to the emergency room repeatedly.  Plan-letter given her permission to use her own self administered respiratory medications and her room with goal partly of giving her more personal control. We can evaluate again for possible use of Nucala or Xolair if needed.

## 2015-03-22 NOTE — Assessment & Plan Note (Signed)
Occasionally has fexofenadine 60 mg each morning and Benadryl 25 mg each evening for trial when needed

## 2015-03-23 ENCOUNTER — Telehealth: Payer: Self-pay | Admitting: Critical Care Medicine

## 2015-03-23 ENCOUNTER — Encounter (HOSPITAL_COMMUNITY): Payer: Self-pay | Admitting: *Deleted

## 2015-03-23 ENCOUNTER — Emergency Department (HOSPITAL_COMMUNITY)
Admission: EM | Admit: 2015-03-23 | Discharge: 2015-03-23 | Disposition: A | Discharge status: Home / Self Care | Payer: Commercial Managed Care - HMO | Attending: Emergency Medicine | Admitting: Emergency Medicine

## 2015-03-23 DIAGNOSIS — Z7951 Long term (current) use of inhaled steroids: Secondary | ICD-10-CM | POA: Insufficient documentation

## 2015-03-23 DIAGNOSIS — Z794 Long term (current) use of insulin: Secondary | ICD-10-CM | POA: Insufficient documentation

## 2015-03-23 DIAGNOSIS — K219 Gastro-esophageal reflux disease without esophagitis: Secondary | ICD-10-CM | POA: Diagnosis not present

## 2015-03-23 DIAGNOSIS — G8929 Other chronic pain: Secondary | ICD-10-CM | POA: Insufficient documentation

## 2015-03-23 DIAGNOSIS — Z79899 Other long term (current) drug therapy: Secondary | ICD-10-CM | POA: Insufficient documentation

## 2015-03-23 DIAGNOSIS — F329 Major depressive disorder, single episode, unspecified: Secondary | ICD-10-CM | POA: Diagnosis not present

## 2015-03-23 DIAGNOSIS — F419 Anxiety disorder, unspecified: Secondary | ICD-10-CM | POA: Diagnosis not present

## 2015-03-23 DIAGNOSIS — E039 Hypothyroidism, unspecified: Secondary | ICD-10-CM | POA: Insufficient documentation

## 2015-03-23 DIAGNOSIS — R0602 Shortness of breath: Secondary | ICD-10-CM | POA: Diagnosis present

## 2015-03-23 DIAGNOSIS — J449 Chronic obstructive pulmonary disease, unspecified: Secondary | ICD-10-CM | POA: Insufficient documentation

## 2015-03-23 DIAGNOSIS — Z7982 Long term (current) use of aspirin: Secondary | ICD-10-CM | POA: Insufficient documentation

## 2015-03-23 DIAGNOSIS — Z7952 Long term (current) use of systemic steroids: Secondary | ICD-10-CM | POA: Insufficient documentation

## 2015-03-23 DIAGNOSIS — E871 Hypo-osmolality and hyponatremia: Secondary | ICD-10-CM | POA: Insufficient documentation

## 2015-03-23 DIAGNOSIS — Z87891 Personal history of nicotine dependence: Secondary | ICD-10-CM | POA: Diagnosis not present

## 2015-03-23 DIAGNOSIS — E119 Type 2 diabetes mellitus without complications: Secondary | ICD-10-CM | POA: Insufficient documentation

## 2015-03-23 DIAGNOSIS — I10 Essential (primary) hypertension: Secondary | ICD-10-CM | POA: Insufficient documentation

## 2015-03-23 DIAGNOSIS — Z91040 Latex allergy status: Secondary | ICD-10-CM | POA: Diagnosis not present

## 2015-03-23 DIAGNOSIS — IMO0001 Reserved for inherently not codable concepts without codable children: Secondary | ICD-10-CM

## 2015-03-23 LAB — CBG MONITORING, ED: Glucose-Capillary: 251 mg/dL — ABNORMAL HIGH (ref 65–99)

## 2015-03-23 MED ORDER — ALBUTEROL SULFATE (2.5 MG/3ML) 0.083% IN NEBU
2.5000 mg | INHALATION_SOLUTION | Freq: Once | RESPIRATORY_TRACT | Status: AC
  Administered 2015-03-23: 2.5 mg via RESPIRATORY_TRACT
  Filled 2015-03-23: qty 3

## 2015-03-23 MED ORDER — IPRATROPIUM-ALBUTEROL 0.5-2.5 (3) MG/3ML IN SOLN
3.0000 mL | Freq: Once | RESPIRATORY_TRACT | Status: AC
  Administered 2015-03-23: 3 mL via RESPIRATORY_TRACT
  Filled 2015-03-23: qty 3

## 2015-03-23 MED ORDER — IPRATROPIUM-ALBUTEROL 0.5-2.5 (3) MG/3ML IN SOLN
RESPIRATORY_TRACT | Status: AC
  Administered 2015-03-23: 3 mL
  Filled 2015-03-23: qty 3

## 2015-03-23 NOTE — Discharge Instructions (Signed)
Take your albuterol neb treatment every 4 hours for 2 days except at night when sleeping

## 2015-03-23 NOTE — Telephone Encounter (Signed)
Received a page from this pt, no answer when I rang home phone.  msg said pt was having difficulty breathing.  May page [place] back

## 2015-03-23 NOTE — ED Notes (Signed)
Notified [facility] to transport patient.

## 2015-03-23 NOTE — ED Notes (Addendum)
Per EMS, pt from [facility] assisted living called EMS due to cough, congestion for the past 2 weeks. Lung sounds clear. Pt was seen at [facility] for same yesterday. Pt has hx of COPD and asthma, states she tried her albuterol inhaler, nebulizer and phenergan cough syrup without relief.

## 2015-03-23 NOTE — ED Provider Notes (Signed)
CSN: [id number]     Arrival date & time 03/23/15  1127 History   First MD Initiated Contact with Patient 03/23/15 1147     Chief Complaint  Patient presents with  . Cough  . Nasal Congestion  . Shortness of Breath     (Consider location/radiation/quality/duration/timing/severity/associated sxs/prior Treatment) Patient is a 64 y.o. [name] presenting with cough and shortness of breath. The history is provided by the patient (The patient states she was having some shortness of breath.).  Cough Cough characteristics:  Non-productive Severity:  Mild Onset quality:  Gradual Timing:  Constant Progression:  Waxing and waning Chronicity:  Recurrent Context: not animal exposure   Associated symptoms: shortness of breath and wheezing   Associated symptoms: no chest pain, no eye discharge, no headaches and no rash   Shortness of Breath Associated symptoms: cough and wheezing   Associated symptoms: no abdominal pain, no chest pain, no headaches and no rash     Past Medical History  Diagnosis Date  . 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 [name], residual palpitations  . Vocal cord dysfunction   . Tremor, essential     takes Mysoline and Neurontin daily.  . Allergic rhinitis     uses Nasonex daily as needed  . Low sodium syndrome   . Chronic back pain     reason unknown  . Lumbar radiculopathy   . DDD (degenerative disc disease), lumbar   . Esophageal reflux     takes Zantac daily  . Hypertension     takes Losartan daily  . Anxiety     takes Ativan daily as needed  . Hypothyroidism     takes Synthroid daily  . Depression     takes Zoloft daily as well as Cymbalta  . Diabetes mellitus     takes NOvolin daily  . Asthma     Albuterol inhaler and Neb daily as needed  . History of bronchitis Dec 2014  . Seizures ([place]) 04-05-13    one time ran out of Primidone and had stopped it cold [place]  . Weakness     in left arm  . Joint  pain   . Joint swelling     left ankle  . Dysphagia   . Constipation   . Allergy to angiotensin receptor blockers (ARB) 01/22/2014    Angioedema  . 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 Rayann Heman 2007   Past Surgical History  Procedure Laterality Date  . Tonsillectomy    . Right breast cyst      benign  . Left ankle ligament repair      x 2  . Left elbow repair    . Partial hysterectomy    . Cholecystectomy    . Cesarean section    . Ablation for avnrt    . Colonoscopy  01/16/2004    CNO:BSJGGE rectum/colon  . Esophagogastroduodenoscopy (egd) with esophageal dilation  04/03/2002    ZMO:[id number] ring, otherwise normal esophagus, status post dilation with 54 F/Normal stomach  . Bartholin gland cyst excision      x 2  . Esophagogastroduodenoscopy (egd) with esophageal dilation N/A 11/08/2012    Dr. [name]:schatzki's ring s/p dilation/hiatal hernia  . Subthalamic stimulator insertion Bilateral 12/04/2013    Procedure: Bilateral Deep brain stimulator placement;  Surgeon: [name], MD;  Location: [facility];  Service: Neurosurgery;  Laterality: Bilateral;  Bilateral Deep brain stimulator placement  . Pulse  generator implant Bilateral 12/15/2013    Procedure: BILATERAL PULSE GENERATOR IMPLANT;  Surgeon: [name], MD;  Location: [facility];  Service: Neurosurgery;  Laterality: Bilateral;  BILATERAL  . Maximum access (mas)posterior lumbar interbody fusion (plif) 1 level N/A 04/24/2014    Procedure: Lumbar four-five Maximum access Surgery posterior lumbar interbody fusion;  Surgeon: [name], MD;  Location: [facility];  Service: Neurosurgery;  Laterality: N/A;  Lumbar four-five Maximum access Surgery posterior lumbar interbody fusion  . Colonoscopy with propofol N/A 03/04/2015    Procedure: COLONOSCOPY WITH PROPOFOL at cecum at 1011; withdrawal time=41mnutes;  Surgeon: RDaneil Dolin MD;  Location: [facility];  Service: Endoscopy;  Laterality: N/A;  .  Esophagogastroduodenoscopy (egd) with propofol N/A 03/04/2015    Procedure: ESOPHAGOGASTRODUODENOSCOPY (EGD) WITH PROPOFOL Procedure #1;  Surgeon: RDaneil Dolin MD;  Location: [facility];  Service: Endoscopy;  Laterality: N/A;  . Esophageal dilation N/A 03/04/2015    Procedure: ESOPHAGEAL DILATION WITH 54FR MALONEY DILATOR;  Surgeon: RDaneil Dolin MD;  Location: [facility];  Service: Endoscopy;  Laterality: N/A;  . Esophageal biopsy  03/04/2015    Procedure: BIOPSY (Duodenal, Gastric);  Surgeon: RDaneil Dolin MD;  Location: [facility];  Service: Endoscopy;;  . Polypectomy  03/04/2015    Procedure: POLYPECTOMY (descending colon);  Surgeon: RDaneil Dolin MD;  Location: [facility];  Service: Endoscopy;;   Family History  Problem Relation Age of Onset  . Lung cancer Father     DIED AGE [id number] CA  . Alcohol abuse Father   . Anxiety disorder Father   . Depression Father   . COPD Mother     DIED AGE 47,MRSA,COPD,PNEUMONIA  . Pneumonia Mother     DIED AGE 47,MRSA,COPD,PNEUMONIA  . Supraventricular tachycardia Mother   . Anxiety disorder Mother   . Depression Mother   . Alcohol abuse Mother   . COPD Brother     AGE 66 . Heart disease Sister     DIED AGE 66 SMOKER,?HEART  . Colon cancer Neg Hx    Social History  Substance Use Topics  . Smoking status: Former Smoker -- 0.30 packs/day for 10 years    Types: Cigarettes    Quit date: 04/23/1993  . Smokeless tobacco: Never Used     Comment: quit smoking 25+yrs ago  . Alcohol Use: No     Comment: no alcohol in 354yr  OB History    Gravida Para Term Preterm AB TAB SAB Ectopic Multiple Living   _0 Review of Systems  Constitutional: Negative for appetite change and fatigue.  HENT: Negative for congestion, ear discharge and sinus pressure.   Eyes: Negative for discharge.  Respiratory: Positive for cough, shortness of breath and wheezing.   Cardiovascular: Negative for chest pain.  Gastrointestinal: Negative for abdominal  pain and diarrhea.  Genitourinary: Negative for frequency and hematuria.  Musculoskeletal: Negative for back pain.  Skin: Negative for rash.  Neurological: Negative for seizures and headaches.  Psychiatric/Behavioral: Negative for hallucinations.      Allergies  Invokana; Losartan; Metformin and related; Latex; Morphine and related; Other; and Oxycodone-acetaminophen  Home Medications   Prior to Admission medications   Medication Sig Start Date End Date Taking? Authorizing Provider  Adhesive Bandages (PLASTIC BANDAGES 3/4") MISC  03/08/15   Historical Provider, MD  albuterol (PROVENTIL) (2.5 MG/3ML) 0.083% nebulizer solution Use every 4 hours as needed.  Pt may keep this  medicine in her room Patient taking differently: Take 2.5 mg by nebulization every 4 (four) hours as needed for wheezing or shortness of breath.  03/20/15   [name], MD  ASPIRIN LOW DOSE 81 MG EC tablet TAKE 1 TABLET BY MOUTH ONCE DAILY. 01/12/15   Janith Lima, MD  bisacodyl (DULCOLAX) 10 MG suppository Place 1 suppository (10 mg total) rectally as needed for moderate constipation. 02/25/15   Janith Lima, MD  buPROPion (WELLBUTRIN XL) 150 MG 24 hr tablet TAKE 1 TABLET BY MOUTH ONCE A DAY. 03/20/15   Janith Lima, MD  fesoterodine (TOVIAZ) 8 MG TB24 tablet Take 1 tablet (8 mg total) by mouth daily. 12/31/14   Janith Lima, MD  fexofenadine (ALLEGRA) 60 MG tablet Take 1 tablet (60 mg total) by mouth 2 (two) times daily. 02/08/15   Deneise Lever, MD  Fluticasone Furoate-Vilanterol (BREO ELLIPTA) 100-25 MCG/INH AEPB Inhale 1 puff then rinse mouth, once daily Patient taking differently: Inhale 1 puff into the lungs daily. Inhale 1 puff then rinse mouth, once daily 02/08/15   Deneise Lever, MD  gabapentin (NEURONTIN) 600 MG tablet TAKE (1) TABLET BY MOUTH TWICE DAILY. 03/20/15   Rebecca S Tat, DO  HUMALOG KWIKPEN 200 UNIT/ML SOPN INJECT 10 UNITS SUBCUTANEOUSLY 3 TIMES DAILY WITH MEALS. 03/16/15   Janith Lima,  MD  HYDROcodone-acetaminophen (NORCO) 10-325 MG tablet Take 1 tablet by mouth every 6 (six) hours as needed for moderate pain. 03/19/15   Janith Lima, MD  Insulin Detemir (LEVEMIR FLEXPEN) 100 UNIT/ML Pen Inject 50 Units into the skin daily at 10 pm. Patient taking differently: Inject 50 Units into the skin every evening. Daily at 8 pm 02/13/15   Janith Lima, MD  lamoTRIgine (LAMICTAL) 25 MG tablet TAKE 1 TABLET BY MOUTH ONCE A DAY. 03/20/15   Janith Lima, MD  levofloxacin (LEVAQUIN) 500 MG tablet Take 1 tablet (500 mg total) by mouth daily. Patient not taking: Reported on 03/22/2015 03/21/15   Alfonzo Beers, MD  levothyroxine (SYNTHROID, LEVOTHROID) Elizabeth MCG tablet TAKE (1) TABLET BY MOUTH ONCE DAILY BEFORE BREAKFAST. 01/12/15   Janith Lima, MD  lidocaine (LIDODERM) 5 % Place 1 patch onto the skin daily. Remove & Discard patch within 12 hours or as directed by MD 03/20/15   Janith Lima, MD  loperamide (IMODIUM) 2 MG capsule Take 1 capsule (2 mg total) by mouth as needed for diarrhea or loose stools. 02/13/15   Janith Lima, MD  metoprolol succinate (TOPROL-XL) 50 MG 24 hr tablet TAKE 1 TABLET BY MOUTH ONCE DAILY. 01/12/15   Janith Lima, MD  miconazole (MONISTAT 1 COMBINATION PACK) kit Use as needed Patient not taking: Reported on 03/22/2015 03/19/15   Janith Lima, MD  neomycin-polymyxin-pramoxine (NEOSPORIN PLUS) 1 % cream Apply topically as needed. 03/19/15   Janith Lima, MD  nystatin (MYCOSTATIN) 100000 UNIT/ML suspension Take 5 mLs (500,000 Units total) by mouth 4 (four) times daily as needed. 03/19/15   Janith Lima, MD  nystatin cream (MYCOSTATIN) Apply 1 application topically 2 (two) times daily as needed for dry skin. 03/19/15   Janith Lima, MD  Omega-3 Fatty Acids (FISH OIL PO) Take 1 capsule by mouth daily.    Historical Provider, MD  ondansetron (ZOFRAN ODT) 8 MG disintegrating tablet Take 1 tablet (8 mg total) by mouth every 8 (eight) hours as needed for nausea  or vomiting. Patient not taking: Reported on 03/22/2015 11/24/14  Evalee Jefferson, PA-C  pantoprazole (PROTONIX) 20 MG tablet Take 20 mg by mouth 2 (two) times daily. 03/20/15   Historical Provider, MD  pantoprazole (PROTONIX) 40 MG tablet Take 1 tablet (40 mg total) by mouth daily. 02/27/15   Janith Lima, MD  polyethylene glycol [facility]) packet Take 17 g by mouth daily as needed for mild constipation. 03/19/15   Janith Lima, MD  potassium chloride SA (K-DUR,KLOR-CON) 20 MEQ tablet TAKE 1 TABLET BY MOUTH ONCE DAILY. 01/12/15   Janith Lima, MD  predniSONE (DELTASONE) 10 MG tablet Take 4 tablets (40 mg total) by mouth daily. 03/16/15   Fredia Sorrow, MD  primidone (MYSOLINE) 50 MG tablet Take 2 tablets (100 mg total) by mouth 2 (two) times daily. 03/06/15   Eustace Quail Tat, DO  PROAIR HFA 108 (90 BASE) MCG/ACT inhaler Take 2 puffs by mouth every 6 (six) hours as needed. 12/27/14   Historical Provider, MD  promethazine-codeine (PHENERGAN WITH CODEINE) 6.25-10 MG/5ML syrup Take 5 mLs by mouth every 6 (six) hours as needed for cough. 03/19/15   Deneise Lever, MD  QUEtiapine (SEROQUEL) 50 MG tablet TAKE (1) TABLET BY MOUTH AT BEDTIME. 03/20/15   Janith Lima, MD  ranitidine (ZANTAC) 150 MG tablet Take 1 tablet (150 mg total) by mouth at bedtime. 03/12/15   Orvil Feil, NP  Respiratory Therapy Supplies (FLUTTER) DEVI Use as directed 01/18/15   Tanda Rockers, MD  Respiratory Therapy Supplies (FLUTTER) DEVI Blow through 4 times per set and repeat 3 sets per day while needed for chest congestion J45.20 03/05/15   Deneise Lever, MD  Sanitary Napkins & Tampons (EQ MAXI OVERNIGHT EXTRA HEAVY) PADS Use as needed 02/27/15   Janith Lima, MD  sodium chloride (OCEAN) 0.65 % SOLN nasal spray 1-2 sprays each nostril up to 4 times daily as needed 02/08/15   Deneise Lever, MD  sucralfate (CARAFATE) 1 G tablet TAKE 1 TABLET BY MOUTH FOUR TIMES A DAY. 03/20/15   Janith Lima, MD  tiZANidine (ZANAFLEX) 4 MG  tablet TAKE 1 TABLET BY MOUTH AT BEDTIME AS NEEDED FOR MUSCLE SPASMS. 03/19/15   Janith Lima, MD  triamterene-hydrochlorothiazide (MAXZIDE-25) 37.5-25 MG tablet TAKE 1 TABLET BY MOUTH ONCE DAILY. 01/12/15   Janith Lima, MD   BP 116/77 mmHg  Pulse 99  Temp(Src) 98.5 F (36.9 C) (Oral)  Resp 20  SpO2 95% Physical Exam  Constitutional: She is oriented to person, place, and time. She appears well-developed.  HENT:  Head: Normocephalic.  Eyes: Conjunctivae and EOM are normal. No scleral icterus.  Neck: Neck supple. No thyromegaly present.  Cardiovascular: Normal rate and regular rhythm.  Exam reveals no gallop and no friction rub.   No murmur heard. Pulmonary/Chest: No stridor. She has wheezes. She has no rales. She exhibits no tenderness.  Abdominal: She exhibits no distension. There is no tenderness. There is no rebound.  Musculoskeletal: Normal range of motion. She exhibits no edema.  Lymphadenopathy:    She has no cervical adenopathy.  Neurological: She is oriented to person, place, and time. She exhibits normal muscle tone. Coordination normal.  Skin: No rash noted. No erythema.  Psychiatric: She has a normal mood and affect. Her behavior is normal.    ED Course  Procedures (including critical care time) Labs Review Labs Reviewed - No data to display  Imaging Review Dg Chest 2 View  03/21/2015  CLINICAL DATA:  Productive cough and wheezing with fever EXAM:  CHEST - 2 VIEW COMPARISON:  03/20/2015 FINDINGS: The heart size and mediastinal contours are within normal limits. Both lungs are clear. The visualized skeletal structures are unremarkable. Stimulating devices are again seen. IMPRESSION: No active disease. Electronically Signed   By: Inez Catalina M.D.   On: 03/21/2015 15:43   I have personally reviewed and evaluated these images and lab results as part of my medical decision-making.   EKG Interpretation None      MDM   Final diagnoses:  None    Patient with  COPD and mild exacerbation. She has been to the emergency department 4 days in a row has been worked up thoroughly and treated appropriately. Patient was given 2 more neb treatment here in emergency department has improved patient was instructed and written instructions were given to use her neb treatment every 4 hours for the next 2 days whether she needs it or not.    [name], MD 03/23/15 [id number]

## 2015-03-24 ENCOUNTER — Telehealth: Payer: Self-pay | Admitting: Pulmonary Disease

## 2015-03-24 NOTE — Telephone Encounter (Signed)
64 yr old female with chronic bronchitis and resident of assisted living calling in for persistent bronchitis symptoms.  Seen in ED 3 times recently and sent home each time.  Persistent cough with sputum.  Presently on albuterol nebs every 4 hrs prn, breo daily, and abx.  She is not in distress.  Able to speak clearly w/o cough or wheezing.  She does not have fever or chills.  I asked her to continue with present treatment and call clinic tomorrow for appt.  She understands and states she will call for appt.

## 2015-03-25 ENCOUNTER — Other Ambulatory Visit: Payer: Commercial Managed Care - HMO

## 2015-03-25 ENCOUNTER — Encounter: Payer: Self-pay | Admitting: Internal Medicine

## 2015-03-25 ENCOUNTER — Telehealth: Payer: Self-pay | Admitting: Internal Medicine

## 2015-03-25 ENCOUNTER — Ambulatory Visit: Payer: Commercial Managed Care - HMO | Admitting: Internal Medicine

## 2015-03-25 ENCOUNTER — Telehealth: Payer: Self-pay

## 2015-03-25 ENCOUNTER — Ambulatory Visit: Payer: Self-pay | Admitting: Internal Medicine

## 2015-03-25 VITALS — BP 126/80 | HR 101 | Ht 64.5 in | Wt 167.8 lb

## 2015-03-25 DIAGNOSIS — J209 Acute bronchitis, unspecified: Secondary | ICD-10-CM

## 2015-03-25 MED ORDER — ARFORMOTEROL TARTRATE 15 MCG/2ML IN NEBU
15.0000 ug | INHALATION_SOLUTION | Freq: Once | RESPIRATORY_TRACT | Status: AC
  Administered 2015-03-25: 15 ug via RESPIRATORY_TRACT

## 2015-03-25 MED ORDER — METHYLPREDNISOLONE ACETATE 80 MG/ML IJ SUSP
80.0000 mg | Freq: Once | INTRAMUSCULAR | Status: AC
  Administered 2015-03-25: 80 mg via INTRAMUSCULAR

## 2015-03-25 MED ORDER — ARFORMOTEROL TARTRATE 15 MCG/2ML IN NEBU: 15.0000 ug | INHALATION_SOLUTION | Freq: Two times a day (BID) | RESPIRATORY_TRACT | Status: DC

## 2015-03-25 MED ORDER — ALUM HYDROXIDE-MAG CARBONATE 95-358 MG/15ML PO SUSP: ORAL | Status: DC

## 2015-03-25 NOTE — Telephone Encounter (Signed)
Spoke with [facility]- was told that they rec'd the fax I sent today for Brovana neb and Gaviscon; no need to send again. The main issue they are having is with insurance covering [place]. They are faxing over PA information to our office.   I spoke with [place](PCC)-we can send Rx with order to [facility] (based on [employer]) and they can get Brovana neb med under patients medical benefits. Will need to inform patient of this and place order and give Rx to [facility] to send order.    I attempted to contact patient and unable to reach her at this time. Will need to tray again Tuesday 03-26-15.

## 2015-03-25 NOTE — Telephone Encounter (Signed)
Pt called and states that she needs Vicente Males or Dr. Gala Romney to fax over a letter stating that it is ok to take her carafate an hour before her scheduled time.  If so the letter can be faxed to [place] @[phone]  Please advise

## 2015-03-25 NOTE — Patient Instructions (Addendum)
Neb Brovana here  Order- Her DME [facility] in [place] nebs twice daily, # 60,   Ref prn     Dx asthma moderate persistent  Script printed for Gaviscon which works better for you than Maalox  I suspect repeated reflux is a trigger for your asthmatic bronchitis. Please see if Dr Gala Romney can help with this.  Depo 80  Sputum cx- routine C&S (on levaquin)    Dx acute bronchitis

## 2015-03-25 NOTE — Telephone Encounter (Signed)
Pt in office now for OV with CY. Will sign off.
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
## 2015-03-25 NOTE — Telephone Encounter (Signed)
Called and spoke with nurse - Pt is on her way to our office to be seen with CY (scheduled appt 03/25/15) Nothing further needed.

## 2015-03-27 ENCOUNTER — Telehealth: Payer: Self-pay | Admitting: Internal Medicine

## 2015-03-27 LAB — RESPIRATORY CULTURE OR RESPIRATORY AND SPUTUM CULTURE: Organism ID, Bacteria: NORMAL

## 2015-03-27 NOTE — Telephone Encounter (Signed)
lmtcb

## 2015-03-27 NOTE — Telephone Encounter (Signed)
Called and spoke with the patient. I reviewed results and recs per CY. Patient voiced understanding and had no further questions. Nothing further needed.

## 2015-03-27 NOTE — Telephone Encounter (Signed)
Pt is requesting the results from her sputum culture from 03/25/15. These results are in [place].  CY - please advise. Thanks.

## 2015-03-27 NOTE — Telephone Encounter (Signed)
Sputum culture just growing routine normal airway flora

## 2015-03-28 ENCOUNTER — Telehealth: Payer: Self-pay | Admitting: Internal Medicine

## 2015-03-28 ENCOUNTER — Encounter: Payer: Self-pay | Admitting: Internal Medicine

## 2015-03-28 ENCOUNTER — Ambulatory Visit (INDEPENDENT_AMBULATORY_CARE_PROVIDER_SITE_OTHER): Payer: Commercial Managed Care - HMO | Admitting: Internal Medicine

## 2015-03-28 VITALS — BP 108/80 | HR 93 | Temp 98.3°F | Resp 16 | Ht 64.5 in | Wt 170.0 lb

## 2015-03-28 DIAGNOSIS — J4540 Moderate persistent asthma, uncomplicated: Secondary | ICD-10-CM | POA: Diagnosis not present

## 2015-03-28 MED ORDER — IPRATROPIUM-ALBUTEROL 0.5-2.5 (3) MG/3ML IN SOLN: 3.0000 mL | Freq: Four times a day (QID) | RESPIRATORY_TRACT | Status: DC

## 2015-03-28 MED ORDER — SALINE SPRAY 0.65 % NA SOLN: NASAL | Status: DC

## 2015-03-28 NOTE — Telephone Encounter (Signed)
Rx has been sent in. Nothing further was needed. 

## 2015-03-28 NOTE — Telephone Encounter (Signed)
Called spoke with pt. Aware to d/c the brovana. RX for duoneb will be sent to [facility]. Nothing further needed

## 2015-03-28 NOTE — Progress Notes (Signed)
Pre visit review using our clinic review tool, if applicable. No additional management support is needed unless otherwise documented below in the visit note. 

## 2015-03-28 NOTE — Progress Notes (Signed)
Subjective:  Patient ID: [name], female    DOB: [birth year]  Age: 64 y.o. MRN: [id number]  CC: Cough   HPI [name] presents for an asthma follow-up. She was recently seen at an urgent care center for flareup of asthma and has been placed on Levaquin, prednisone and Phenergan with codeine. She is feeling much better with the use of inhalers as well. She has an improving cough that is productive of a thin, milky phlegm.  Outpatient Prescriptions Prior to Visit  Medication Sig Dispense Refill  . Adhesive Bandages (PLASTIC BANDAGES 3/4") MISC     . albuterol (PROVENTIL) (2.5 MG/3ML) 0.083% nebulizer solution Use every 4 hours as needed.  Pt may keep this medicine in her room (Patient taking differently: Take 2.5 mg by nebulization every 4 (four) hours as needed for wheezing or shortness of breath. ) 30 vial 1  . aluminum hydroxide-magnesium carbonate (GAVISCON) 95-358 MG/15ML SUSP 2 tablespoons every 6 hours for indigestion 355 mL prn  . ASPIRIN LOW DOSE 81 MG EC tablet TAKE 1 TABLET BY MOUTH ONCE DAILY. 30 tablet 11  . bisacodyl (DULCOLAX) 10 MG suppository Place 1 suppository (10 mg total) rectally as needed for moderate constipation. 12 suppository 0  . buPROPion (WELLBUTRIN XL) 150 MG 24 hr tablet TAKE 1 TABLET BY MOUTH ONCE A DAY. 90 tablet 3  . calcium carbonate (TUMS - DOSED IN MG ELEMENTAL CALCIUM) 500 MG chewable tablet Chew 500-1,000 mg by mouth every 6 (six) hours as needed for indigestion or heartburn.    . fesoterodine (TOVIAZ) 8 MG TB24 tablet Take 1 tablet (8 mg total) by mouth daily. 30 tablet 11  . fexofenadine (ALLEGRA) 60 MG tablet Take 1 tablet (60 mg total) by mouth 2 (two) times daily. 60 tablet 11  . Fluticasone Furoate-Vilanterol (BREO ELLIPTA) 100-25 MCG/INH AEPB Inhale 1 puff then rinse mouth, once daily (Patient taking differently: Inhale 1 puff into the lungs daily. Inhale 1 puff then rinse mouth, once daily) 60 each 11  . gabapentin (NEURONTIN) 600 MG  tablet TAKE (1) TABLET BY MOUTH TWICE DAILY. 60 tablet 5  . HUMALOG KWIKPEN 200 UNIT/ML SOPN INJECT 10 UNITS SUBCUTANEOUSLY 3 TIMES DAILY WITH MEALS. 15 mL 3  . HYDROcodone-acetaminophen (NORCO) 10-325 MG tablet Take 1 tablet by mouth every 6 (six) hours as needed for moderate pain. 75 tablet 0  . Insulin Detemir (LEVEMIR FLEXPEN) 100 UNIT/ML Pen Inject 50 Units into the skin daily at 10 pm. (Patient taking differently: Inject 50 Units into the skin every evening. Daily at 8 pm) 15 mL 11  . lamoTRIgine (LAMICTAL) 25 MG tablet TAKE 1 TABLET BY MOUTH ONCE A DAY. 90 tablet 3  . levofloxacin (LEVAQUIN) 500 MG tablet Take 1 tablet (500 mg total) by mouth daily. 9 tablet 0  . levothyroxine (SYNTHROID, LEVOTHROID) 75 MCG tablet TAKE (1) TABLET BY MOUTH ONCE DAILY BEFORE BREAKFAST. 30 tablet 5  . lidocaine (LIDODERM) 5 % Place 1 patch onto the skin daily. Remove & Discard patch within 12 hours or as directed by MD 90 patch 3  . loperamide (IMODIUM) 2 MG capsule Take 1 capsule (2 mg total) by mouth as needed for diarrhea or loose stools. 30 capsule 11  . Menthol, Topical Analgesic, (BIOFREEZE EX) Apply 1 application topically daily as needed. Apply topically to hands as needed    . metoprolol succinate (TOPROL-XL) 50 MG 24 hr tablet TAKE 1 TABLET BY MOUTH ONCE DAILY. 30 tablet 11  . miconazole (MONISTAT 1 COMBINATION  PACK) kit Use as needed 1 each 11  . Multiple Vitamins-Minerals (THERA-M) TABS Take 1 tablet by mouth daily.    Marland Kitchen neomycin-polymyxin-pramoxine (NEOSPORIN PLUS) 1 % cream Apply topically as needed. (Patient taking differently: Apply 1 application topically 3 (three) times daily. ) 14.2 g 11  . nystatin (MYCOSTATIN) 100000 UNIT/ML suspension Take 5 mLs (500,000 Units total) by mouth 4 (four) times daily as needed. 60 mL 0  . nystatin cream (MYCOSTATIN) Apply 1 application topically 2 (two) times daily as needed for dry skin. 30 g 0  . Omega-3 Fatty Acids (FISH OIL PO) Take 1 capsule by mouth daily.     . ondansetron (ZOFRAN ODT) 8 MG disintegrating tablet Take 1 tablet (8 mg total) by mouth every 8 (eight) hours as needed for nausea or vomiting. 20 tablet 0  . OVER THE COUNTER MEDICATION Place 1 suppository rectally daily as needed (constipation). Laxative suppository    . pantoprazole (PROTONIX) 20 MG tablet Take 20 mg by mouth 2 (two) times daily.    . polyethylene glycol (MIRALAX) packet Take 17 g by mouth daily as needed for mild constipation. 14 each 0  . potassium chloride SA (K-DUR,KLOR-CON) 20 MEQ tablet TAKE 1 TABLET BY MOUTH ONCE DAILY. 30 tablet 11  . predniSONE (DELTASONE) 10 MG tablet Take 4 tablets (40 mg total) by mouth daily. 20 tablet 0  . primidone (MYSOLINE) 50 MG tablet Take 2 tablets (100 mg total) by mouth 2 (two) times daily. 360 tablet 1  . PROAIR HFA 108 (90 BASE) MCG/ACT inhaler Take 2 puffs by mouth every 6 (six) hours as needed for wheezing or shortness of breath.     . promethazine-codeine (PHENERGAN WITH CODEINE) 6.25-10 MG/5ML syrup Take 5 mLs by mouth every 6 (six) hours as needed for cough. 200 mL 0  . QUEtiapine (SEROQUEL) 50 MG tablet TAKE (1) TABLET BY MOUTH AT BEDTIME. 90 tablet 3  . ranitidine (ZANTAC) 150 MG tablet Take 1 tablet (150 mg total) by mouth at bedtime. 30 tablet 5  . Respiratory Therapy Supplies (FLUTTER) DEVI Use as directed 1 each 0  . Respiratory Therapy Supplies (FLUTTER) DEVI Blow through 4 times per set and repeat 3 sets per day while needed for chest congestion J45.20 1 each 0  . Sanitary Napkins & Tampons (EQ MAXI OVERNIGHT EXTRA HEAVY) PADS Use as needed 20 each 5  . sucralfate (CARAFATE) 1 G tablet TAKE 1 TABLET BY MOUTH FOUR TIMES A DAY. 120 tablet 5  . tiZANidine (ZANAFLEX) 4 MG tablet TAKE 1 TABLET BY MOUTH AT BEDTIME AS NEEDED FOR MUSCLE SPASMS. 30 tablet 11  . triamterene-hydrochlorothiazide (MAXZIDE-25) 37.5-25 MG tablet TAKE 1 TABLET BY MOUTH ONCE DAILY. 30 tablet 11  . alum & mag hydroxide-simeth (MAALOX/MYLANTA) 200-200-20  MG/5ML suspension Take 15-30 mLs by mouth every 6 (six) hours as needed for indigestion or heartburn.    Marland Kitchen arformoterol (BROVANA) 15 MCG/2ML NEBU Take 2 mLs (15 mcg total) by nebulization 2 (two) times daily. 120 mL 6  . sodium chloride (OCEAN) 0.65 % SOLN nasal spray 1-2 sprays each nostril up to 4 times daily as needed 30 mL prn   No facility-administered medications prior to visit.    ROS Review of Systems  Constitutional: Negative.  Negative for fever, chills, diaphoresis, appetite change and fatigue.  HENT: Negative.  Negative for congestion, facial swelling, sore throat and trouble swallowing.   Eyes: Negative.   Respiratory: Positive for cough and wheezing. Negative for apnea, choking, chest tightness, shortness  of breath and stridor.   Cardiovascular: Negative.  Negative for chest pain, palpitations and leg swelling.  Gastrointestinal: Negative.  Negative for nausea, vomiting, abdominal pain, diarrhea and constipation.  Endocrine: Negative.   Genitourinary: Negative.   Musculoskeletal: Negative.  Negative for myalgias, back pain and arthralgias.  Skin: Negative.  Negative for color change and rash.  Allergic/Immunologic: Negative.   Neurological: Negative.  Negative for dizziness, syncope, weakness, light-headedness and numbness.  Hematological: Negative.  Negative for adenopathy. Does not bruise/bleed easily.  Psychiatric/Behavioral: Negative.     Objective:  BP 108/80 mmHg  Pulse 93  Temp(Src) 98.3 F (36.8 C) (Oral)  Resp 16  Ht 5' 4.5" (1.638 m)  Wt 170 lb (77.111 kg)  BMI 28.74 kg/m2  SpO2 98%  BP Readings from Last 3 Encounters:  03/28/15 108/80  03/25/15 126/80  03/23/15 133/68    Wt Readings from Last 3 Encounters:  03/28/15 170 lb (77.111 kg)  03/25/15 167 lb 12.8 oz (76.114 kg)  03/22/15 164 lb (74.39 kg)    Physical Exam  Constitutional: She is oriented to person, place, and time.  Non-toxic appearance. She does not have a sickly appearance. She  does not appear ill. No distress.  HENT:  Mouth/Throat: Oropharynx is clear and moist. No oropharyngeal exudate.  Eyes: Conjunctivae are normal. Right eye exhibits no discharge. Left eye exhibits no discharge. No scleral icterus.  Neck: Normal range of motion. Neck supple. No JVD present. No tracheal deviation present. No thyromegaly present.  Cardiovascular: Normal rate, regular rhythm, normal heart sounds and intact distal pulses.  Exam reveals no gallop and no friction rub.   No murmur heard. Pulmonary/Chest: Effort normal. No accessory muscle usage or stridor. No tachypnea. No respiratory distress. She has no decreased breath sounds. She has no wheezes. She has rhonchi in the right middle field. She has no rales. She exhibits no tenderness.  There are a few, scattered, bilateral rhonchi. She has good air movement  Abdominal: Soft. Bowel sounds are normal. She exhibits no distension and no mass. There is no tenderness. There is no rebound and no guarding.  Musculoskeletal: Normal range of motion. She exhibits no edema or tenderness.  Lymphadenopathy:    She has no cervical adenopathy.  Neurological: She is oriented to person, place, and time.  Skin: Skin is warm and dry. No rash noted. She is not diaphoretic. No erythema. No pallor.  Vitals reviewed.   Lab Results  Component Value Date   WBC 9.8 03/22/2015   HGB 12.7 03/22/2015   HCT 37.5 03/22/2015   PLT 166 03/22/2015   GLUCOSE 284* 03/22/2015   CHOL 180 02/13/2015   TRIG 175.0* 02/13/2015   HDL 64.80 02/13/2015   LDLCALC 80 02/13/2015   ALT 43 03/08/2015   AST 28 03/08/2015   NA 131* 03/22/2015   K 4.1 03/22/2015   CL 96* 03/22/2015   CREATININE 0.69 03/22/2015   BUN 8 03/22/2015   CO2 24 03/22/2015   TSH 1.18 02/13/2015   INR 0.99 01/04/2015   HGBA1C 7.0* 02/13/2015   MICROALBUR 1.7 02/13/2015    No results found.  Assessment & Plan:   [name] was seen today for cough.  Diagnoses and all orders for this  visit:  Asthma, moderate persistent, poorly-controlled- marked improvement noted.  She will complete the course of Levaquin, she will continue the prednisone taper, she will continue to use her inhalers as needed.   I have discontinued [name]'s alum & mag hydroxide-simeth. I am also having  her maintain her ondansetron, fesoterodine, ASPIRIN LOW DOSE, metoprolol succinate, potassium chloride SA, triamterene-hydrochlorothiazide, levothyroxine, FLUTTER, Fluticasone Furoate-Vilanterol, fexofenadine, loperamide, Insulin Detemir, bisacodyl, Omega-3 Fatty Acids (FISH OIL PO), EQ MAXI OVERNIGHT EXTRA HEAVY, FLUTTER, primidone, ranitidine, HUMALOG KWIKPEN, predniSONE, Plastic Bandages 3/4", miconazole, neomycin-polymyxin-pramoxine, nystatin, nystatin cream, polyethylene glycol, HYDROcodone-acetaminophen, tiZANidine, promethazine-codeine, lidocaine, buPROPion, gabapentin, lamoTRIgine, QUEtiapine, sucralfate, albuterol, levofloxacin, PROAIR HFA, pantoprazole, THERA-M, (Menthol, Topical Analgesic, (BIOFREEZE EX)), calcium carbonate, OVER THE COUNTER MEDICATION, and aluminum hydroxide-magnesium carbonate.  No orders of the defined types were placed in this encounter.     Follow-up: Return in about 4 months (around 07/27/2015).  Scarlette Calico, MD

## 2015-03-28 NOTE — Telephone Encounter (Signed)
Patient says that she spoke with pharmacy.  Insurance says they will not pay for Brovana.  Patient wants to know if she can get DuoNeb instead and fax a hardcopy to [facility]. She said she had pharmacy run through the [employer] and the insurance will pay for DuoNeb.  Allergies  Allergen Reactions  . Invokana [Canagliflozin] Other (See Comments)    Yeast infectios  . Losartan     Angioedema   . Metformin And Related Diarrhea  . Latex Other (See Comments)    Other Reaction: redness, blisters  . Morphine And Related Other (See Comments)    Pt. States med makes her crazy  . Other Other (See Comments)    Other Reaction: redness, blisters  . Oxycodone-Acetaminophen Itching    TYLOX. Caused internal itching per patient    Current Outpatient Prescriptions on File Prior to Visit  Medication Sig Dispense Refill  . Adhesive Bandages (PLASTIC BANDAGES 3/4") MISC     . albuterol (PROVENTIL) (2.5 MG/3ML) 0.083% nebulizer solution Use every 4 hours as needed.  Pt may keep this medicine in her room (Patient taking differently: Take 2.5 mg by nebulization every 4 (four) hours as needed for wheezing or shortness of breath. ) 30 vial 1  . aluminum hydroxide-magnesium carbonate (GAVISCON) 95-358 MG/15ML SUSP 2 tablespoons every 6 hours for indigestion 355 mL prn  . arformoterol (BROVANA) 15 MCG/2ML NEBU Take 2 mLs (15 mcg total) by nebulization 2 (two) times daily. 120 mL 6  . ASPIRIN LOW DOSE 81 MG EC tablet TAKE 1 TABLET BY MOUTH ONCE DAILY. 30 tablet 11  . bisacodyl (DULCOLAX) 10 MG suppository Place 1 suppository (10 mg total) rectally as needed for moderate constipation. 12 suppository 0  . buPROPion (WELLBUTRIN XL) 150 MG 24 hr tablet TAKE 1 TABLET BY MOUTH ONCE A DAY. 90 tablet 3  . calcium carbonate (TUMS - DOSED IN MG ELEMENTAL CALCIUM) 500 MG chewable tablet Chew 500-1,000 mg by mouth every 6 (six) hours as needed for indigestion or heartburn.    . fesoterodine (TOVIAZ) 8 MG TB24 tablet Take 1 tablet  (8 mg total) by mouth daily. 30 tablet 11  . fexofenadine (ALLEGRA) 60 MG tablet Take 1 tablet (60 mg total) by mouth 2 (two) times daily. 60 tablet 11  . Fluticasone Furoate-Vilanterol (BREO ELLIPTA) 100-25 MCG/INH AEPB Inhale 1 puff then rinse mouth, once daily (Patient taking differently: Inhale 1 puff into the lungs daily. Inhale 1 puff then rinse mouth, once daily) 60 each 11  . gabapentin (NEURONTIN) 600 MG tablet TAKE (1) TABLET BY MOUTH TWICE DAILY. 60 tablet 5  . HUMALOG KWIKPEN 200 UNIT/ML SOPN INJECT 10 UNITS SUBCUTANEOUSLY 3 TIMES DAILY WITH MEALS. 15 mL 3  . HYDROcodone-acetaminophen (NORCO) 10-325 MG tablet Take 1 tablet by mouth every 6 (six) hours as needed for moderate pain. 75 tablet 0  . Insulin Detemir (LEVEMIR FLEXPEN) 100 UNIT/ML Pen Inject 50 Units into the skin daily at 10 pm. (Patient taking differently: Inject 50 Units into the skin every evening. Daily at 8 pm) 15 mL 11  . lamoTRIgine (LAMICTAL) 25 MG tablet TAKE 1 TABLET BY MOUTH ONCE A DAY. 90 tablet 3  . levofloxacin (LEVAQUIN) 500 MG tablet Take 1 tablet (500 mg total) by mouth daily. 9 tablet 0  . levothyroxine (SYNTHROID, LEVOTHROID) 75 MCG tablet TAKE (1) TABLET BY MOUTH ONCE DAILY BEFORE BREAKFAST. 30 tablet 5  . lidocaine (LIDODERM) 5 % Place 1 patch onto the skin daily. Remove & Discard patch  within 12 hours or as directed by MD 90 patch 3  . loperamide (IMODIUM) 2 MG capsule Take 1 capsule (2 mg total) by mouth as needed for diarrhea or loose stools. 30 capsule 11  . Menthol, Topical Analgesic, (BIOFREEZE EX) Apply 1 application topically daily as needed. Apply topically to hands as needed    . metoprolol succinate (TOPROL-XL) 50 MG 24 hr tablet TAKE 1 TABLET BY MOUTH ONCE DAILY. 30 tablet 11  . miconazole (MONISTAT 1 COMBINATION PACK) kit Use as needed 1 each 11  . Multiple Vitamins-Minerals (THERA-M) TABS Take 1 tablet by mouth daily.    Marland Kitchen neomycin-polymyxin-pramoxine (NEOSPORIN PLUS) 1 % cream Apply topically  as needed. (Patient taking differently: Apply 1 application topically 3 (three) times daily. ) 14.2 g 11  . nystatin (MYCOSTATIN) 100000 UNIT/ML suspension Take 5 mLs (500,000 Units total) by mouth 4 (four) times daily as needed. 60 mL 0  . nystatin cream (MYCOSTATIN) Apply 1 application topically 2 (two) times daily as needed for dry skin. 30 g 0  . Omega-3 Fatty Acids (FISH OIL PO) Take 1 capsule by mouth daily.    . ondansetron (ZOFRAN ODT) 8 MG disintegrating tablet Take 1 tablet (8 mg total) by mouth every 8 (eight) hours as needed for nausea or vomiting. 20 tablet 0  . OVER THE COUNTER MEDICATION Place 1 suppository rectally daily as needed (constipation). Laxative suppository    . pantoprazole (PROTONIX) 20 MG tablet Take 20 mg by mouth 2 (two) times daily.    . polyethylene glycol (MIRALAX) packet Take 17 g by mouth daily as needed for mild constipation. 14 each 0  . potassium chloride SA (K-DUR,KLOR-CON) 20 MEQ tablet TAKE 1 TABLET BY MOUTH ONCE DAILY. 30 tablet 11  . predniSONE (DELTASONE) 10 MG tablet Take 4 tablets (40 mg total) by mouth daily. 20 tablet 0  . primidone (MYSOLINE) 50 MG tablet Take 2 tablets (100 mg total) by mouth 2 (two) times daily. 360 tablet 1  . PROAIR HFA 108 (90 BASE) MCG/ACT inhaler Take 2 puffs by mouth every 6 (six) hours as needed for wheezing or shortness of breath.     . promethazine-codeine (PHENERGAN WITH CODEINE) 6.25-10 MG/5ML syrup Take 5 mLs by mouth every 6 (six) hours as needed for cough. 200 mL 0  . QUEtiapine (SEROQUEL) 50 MG tablet TAKE (1) TABLET BY MOUTH AT BEDTIME. 90 tablet 3  . ranitidine (ZANTAC) 150 MG tablet Take 1 tablet (150 mg total) by mouth at bedtime. 30 tablet 5  . Respiratory Therapy Supplies (FLUTTER) DEVI Use as directed 1 each 0  . Respiratory Therapy Supplies (FLUTTER) DEVI Blow through 4 times per set and repeat 3 sets per day while needed for chest congestion J45.20 1 each 0  . Sanitary Napkins & Tampons (EQ MAXI OVERNIGHT  EXTRA HEAVY) PADS Use as needed 20 each 5  . sodium chloride (OCEAN) 0.65 % SOLN nasal spray 1-2 sprays each nostril up to 4 times daily as needed for sinus congestion or dryness 30 mL 2  . sucralfate (CARAFATE) 1 G tablet TAKE 1 TABLET BY MOUTH FOUR TIMES A DAY. 120 tablet 5  . tiZANidine (ZANAFLEX) 4 MG tablet TAKE 1 TABLET BY MOUTH AT BEDTIME AS NEEDED FOR MUSCLE SPASMS. 30 tablet 11  . triamterene-hydrochlorothiazide (MAXZIDE-25) 37.5-25 MG tablet TAKE 1 TABLET BY MOUTH ONCE DAILY. 30 tablet 11   No current facility-administered medications on file prior to visit.

## 2015-03-28 NOTE — Telephone Encounter (Signed)
lmtcb X2 for pt.  

## 2015-03-28 NOTE — Patient Instructions (Signed)

## 2015-03-28 NOTE — Telephone Encounter (Signed)
Ok to d/c [place] order  [place] to send Rx Dunoneb, # 150 ml, 1 neb 4 times daily if needed, refill x 12

## 2015-03-28 NOTE — Telephone Encounter (Signed)
Dessie from [employer] called stating the prescriptions for  Ketoconazole . Nystatin biofreeze needs to say what they are needed for

## 2015-03-28 NOTE — Telephone Encounter (Signed)
Called [facility] and advised them that we D/C'd Brovana and started on DuoNeb. Patient notified that [facility] has been notified. Nothing further needed.

## 2015-03-29 ENCOUNTER — Telehealth: Payer: Self-pay | Admitting: Internal Medicine

## 2015-03-29 NOTE — Telephone Encounter (Signed)
This was taken care of yesterday. Pt brovana was d/c'd and rx'd duoneb. Will sign off

## 2015-03-29 NOTE — Telephone Encounter (Signed)
Patient called me today. Stating having quite a bit early morning reflux. Wants to take Carafate 30 minutes before meals and not just before meals. Only taking Protonix once daily. She is scheduled to see [place] back next month. [facility]. I attempted to change the order verbally to 30 minutes before meals and at bedtime on the Carafate; and increase Protonix to 40 mg twice daily. She stated she required written order within 1 hour. It was not a position to fax that information. So, I elected to write a new written prescriptions as follows: Carafate-dispense 60 doses take 1 g by mouth 30 minutes before meals and at bedtime; Protonix 40 mg tablets 1 twice a day dispense 60 take 1 twice daily with 2 refills. Prescriptions being sent to [facility] from  the endoscopy unit at [facility] since office is closed for Christmas.

## 2015-03-30 ENCOUNTER — Telehealth: Payer: Self-pay | Admitting: Internal Medicine

## 2015-03-30 ENCOUNTER — Telehealth: Payer: Self-pay | Admitting: Pulmonary Disease

## 2015-03-30 NOTE — Telephone Encounter (Signed)
[name] at [facility] [id number] reported duplicate scripts for sucralfate and carafate. Patient gets anxious and broadcasts her med requests to multiple providers.

## 2015-03-30 NOTE — Telephone Encounter (Signed)
Pt was seen by Dr. Annamaria Boots and reports she was given scripts for pantoprazole and sucralfate pill.  She was advised to f/u with Dr. Gala Romney with GI due to concern about reflux contributing to her asthma.  She ad sucralfate liquid called into her pharmacy.  She is confused about what to do with her medications.  I advised her to continue pantoprazole.  I advised her that she should pick whether she preferred to take pill or liquid form of sucralfate and just use that form >> she should not take both pill and liquid form of sucralfate at same time.  She expressed understanding of these instructions.

## 2015-04-01 MED ORDER — PREDNISONE 10 MG PO TABS: ORAL_TABLET | ORAL | Status: DC

## 2015-04-01 NOTE — Telephone Encounter (Signed)
She called answering service this morning complaining of ongoing cough with mucus production. No fevers, chills, or chest pain.  No dyspnea.  I called in prednisone.  Advised she go to urgent care or ER if she has dyspnea, chest pain, or fever.   Asked her to call our office to let us know how she is doing later this week.  Roselie Awkward, MD [facility] Pager: [phone] Cell: [phone] After 3pm or if no response, call [phone]

## 2015-04-02 ENCOUNTER — Telehealth: Payer: Self-pay | Admitting: Internal Medicine

## 2015-04-02 MED ORDER — PREDNISONE 10 MG PO TABS: ORAL_TABLET | ORAL | Status: DC

## 2015-04-02 NOTE — Telephone Encounter (Signed)
ATC PT at # provided, line rang numerous times, NA and no VM. WCB

## 2015-04-02 NOTE — Telephone Encounter (Signed)
Rx has been sent again. Attempted to contact pt. No answer. Will try back.

## 2015-04-02 NOTE — Telephone Encounter (Signed)
Pt calling stating that prednisone wasn't called in, please advise.[name]

## 2015-04-02 NOTE — Telephone Encounter (Signed)
Spoke with pt. She spoke with Dr. Lake Bells yesterday and he called in prednisone for her. Pt reports she is still not feeling better. C/o prod cough (yellow mucus) and is having some wheezing. SOB unchanged. Pt also requesting a work in appt with Dr. Annamaria Boots sooner than her next appt scheduled in February.  Also wants an order from Dr. Annamaria Boots to d/c tablet form of carafate. Also wants to know if he wants her to continue on tums, gavascon and mylanta?  Please advise Dr. Annamaria Boots thanks  Allergies  Allergen Reactions  . Invokana [Canagliflozin] Other (See Comments)    Yeast infectios  . Losartan     Angioedema   . Metformin And Related Diarrhea  . Latex Other (See Comments)    Other Reaction: redness, blisters  . Morphine And Related Other (See Comments)    Pt. States med makes her crazy  . Other Other (See Comments)    Other Reaction: redness, blisters  . Oxycodone-Acetaminophen Itching    TYLOX. Caused internal itching per patient      Current Outpatient Prescriptions on File Prior to Visit  Medication Sig Dispense Refill  . Adhesive Bandages (PLASTIC BANDAGES 3/4") MISC     . albuterol (PROVENTIL) (2.5 MG/3ML) 0.083% nebulizer solution Use every 4 hours as needed.  Pt may keep this medicine in her room (Patient taking differently: Take 2.5 mg by nebulization every 4 (four) hours as needed for wheezing or shortness of breath. ) 30 vial 1  . aluminum hydroxide-magnesium carbonate (GAVISCON) 95-358 MG/15ML SUSP 2 tablespoons every 6 hours for indigestion 355 mL prn  . ASPIRIN LOW DOSE 81 MG EC tablet TAKE 1 TABLET BY MOUTH ONCE DAILY. 30 tablet 11  . bisacodyl (DULCOLAX) 10 MG suppository Place 1 suppository (10 mg total) rectally as needed for moderate constipation. 12 suppository 0  . buPROPion (WELLBUTRIN XL) 150 MG 24 hr tablet TAKE 1 TABLET BY MOUTH ONCE A DAY. 90 tablet 3  . calcium carbonate (TUMS - DOSED IN MG ELEMENTAL CALCIUM) 500 MG chewable tablet Chew 500-1,000 mg by mouth every  6 (six) hours as needed for indigestion or heartburn.    . fesoterodine (TOVIAZ) 8 MG TB24 tablet Take 1 tablet (8 mg total) by mouth daily. 30 tablet 11  . fexofenadine (ALLEGRA) 60 MG tablet Take 1 tablet (60 mg total) by mouth 2 (two) times daily. 60 tablet 11  . Fluticasone Furoate-Vilanterol (BREO ELLIPTA) 100-25 MCG/INH AEPB Inhale 1 puff then rinse mouth, once daily (Patient taking differently: Inhale 1 puff into the lungs daily. Inhale 1 puff then rinse mouth, once daily) 60 each 11  . gabapentin (NEURONTIN) 600 MG tablet TAKE (1) TABLET BY MOUTH TWICE DAILY. 60 tablet 5  . HUMALOG KWIKPEN 200 UNIT/ML SOPN INJECT 10 UNITS SUBCUTANEOUSLY 3 TIMES DAILY WITH MEALS. 15 mL 3  . HYDROcodone-acetaminophen (NORCO) 10-325 MG tablet Take 1 tablet by mouth every 6 (six) hours as needed for moderate pain. 75 tablet 0  . Insulin Detemir (LEVEMIR FLEXPEN) 100 UNIT/ML Pen Inject 50 Units into the skin daily at 10 pm. (Patient taking differently: Inject 50 Units into the skin every evening. Daily at 8 pm) 15 mL 11  . ipratropium-albuterol (DUONEB) 0.5-2.5 (3) MG/3ML SOLN Take 3 mLs by nebulization 4 (four) times daily. Dx code J45.50 150 mL 12  . lamoTRIgine (LAMICTAL) 25 MG tablet TAKE 1 TABLET BY MOUTH ONCE A DAY. 90 tablet 3  . levofloxacin (LEVAQUIN) 500 MG tablet Take 1 tablet (500 mg total) by  mouth daily. 9 tablet 0  . levothyroxine (SYNTHROID, LEVOTHROID) 75 MCG tablet TAKE (1) TABLET BY MOUTH ONCE DAILY BEFORE BREAKFAST. 30 tablet 5  . lidocaine (LIDODERM) 5 % Place 1 patch onto the skin daily. Remove & Discard patch within 12 hours or as directed by MD 90 patch 3  . loperamide (IMODIUM) 2 MG capsule Take 1 capsule (2 mg total) by mouth as needed for diarrhea or loose stools. 30 capsule 11  . Menthol, Topical Analgesic, (BIOFREEZE EX) Apply 1 application topically daily as needed. Apply topically to hands as needed    . metoprolol succinate (TOPROL-XL) 50 MG 24 hr tablet TAKE 1 TABLET BY MOUTH ONCE  DAILY. 30 tablet 11  . miconazole (MONISTAT 1 COMBINATION PACK) kit Use as needed 1 each 11  . Multiple Vitamins-Minerals (THERA-M) TABS Take 1 tablet by mouth daily.    Marland Kitchen neomycin-polymyxin-pramoxine (NEOSPORIN PLUS) 1 % cream Apply topically as needed. (Patient taking differently: Apply 1 application topically 3 (three) times daily. ) 14.2 g 11  . nystatin (MYCOSTATIN) 100000 UNIT/ML suspension Take 5 mLs (500,000 Units total) by mouth 4 (four) times daily as needed. 60 mL 0  . nystatin cream (MYCOSTATIN) Apply 1 application topically 2 (two) times daily as needed for dry skin. 30 g 0  . Omega-3 Fatty Acids (FISH OIL PO) Take 1 capsule by mouth daily.    . ondansetron (ZOFRAN ODT) 8 MG disintegrating tablet Take 1 tablet (8 mg total) by mouth every 8 (eight) hours as needed for nausea or vomiting. 20 tablet 0  . OVER THE COUNTER MEDICATION Place 1 suppository rectally daily as needed (constipation). Laxative suppository    . pantoprazole (PROTONIX) 20 MG tablet Take 20 mg by mouth 2 (two) times daily.    . polyethylene glycol (MIRALAX) packet Take 17 g by mouth daily as needed for mild constipation. 14 each 0  . potassium chloride SA (K-DUR,KLOR-CON) 20 MEQ tablet TAKE 1 TABLET BY MOUTH ONCE DAILY. 30 tablet 11  . predniSONE (DELTASONE) 10 MG tablet Take 4 tablets (40 mg total) by mouth daily. 20 tablet 0  . predniSONE (DELTASONE) 10 MG tablet Take 48m po daily for 3 days, then take 330mpo daily for 3 days, then take 2058mo daily for two days, then take 63m59m daily for 2 days 27 tablet 0  . primidone (MYSOLINE) 50 MG tablet Take 2 tablets (100 mg total) by mouth 2 (two) times daily. 360 tablet 1  . PROAIR HFA 108 (90 BASE) MCG/ACT inhaler Take 2 puffs by mouth every 6 (six) hours as needed for wheezing or shortness of breath.     . promethazine-codeine (PHENERGAN WITH CODEINE) 6.25-10 MG/5ML syrup Take 5 mLs by mouth every 6 (six) hours as needed for cough. 200 mL 0  . QUEtiapine (SEROQUEL)  50 MG tablet TAKE (1) TABLET BY MOUTH AT BEDTIME. 90 tablet 3  . ranitidine (ZANTAC) 150 MG tablet Take 1 tablet (150 mg total) by mouth at bedtime. 30 tablet 5  . Respiratory Therapy Supplies (FLUTTER) DEVI Use as directed 1 each 0  . Respiratory Therapy Supplies (FLUTTER) DEVI Blow through 4 times per set and repeat 3 sets per day while needed for chest congestion J45.20 1 each 0  . Sanitary Napkins & Tampons (EQ MAXI OVERNIGHT EXTRA HEAVY) PADS Use as needed 20 each 5  . sodium chloride (OCEAN) 0.65 % SOLN nasal spray 1-2 sprays each nostril up to 4 times daily as needed for sinus congestion or  dryness 30 mL 2  . sucralfate (CARAFATE) 1 G tablet TAKE 1 TABLET BY MOUTH FOUR TIMES A DAY. 120 tablet 5  . tiZANidine (ZANAFLEX) 4 MG tablet TAKE 1 TABLET BY MOUTH AT BEDTIME AS NEEDED FOR MUSCLE SPASMS. 30 tablet 11  . triamterene-hydrochlorothiazide (MAXZIDE-25) 37.5-25 MG tablet TAKE 1 TABLET BY MOUTH ONCE DAILY. 30 tablet 11   No current facility-administered medications on file prior to visit.

## 2015-04-02 NOTE — Telephone Encounter (Signed)
Pt has called back needing a referral to [facility] . [phone] and her appointment is 1/4 at 1:45

## 2015-04-02 NOTE — Telephone Encounter (Signed)
Needs [facility] referral to Dr. Ila Mcgill (podiatrist) for toe nail cut because patient has diabetes.  Phone number to office is [phone] at [facility].  Patient has appointment 1/9 and 9:15am.

## 2015-04-02 NOTE — Telephone Encounter (Signed)
Called and spoke with patient. Informed her of CY's recs. She stated that she would contact her PCP and call back if she has any further questions. Pt voiced understanding and had no further questions. Nothing further needed.

## 2015-04-02 NOTE — Telephone Encounter (Signed)
Asthmatic bronchitis with cough and yellow sputum is almost certainly viral. Don't expect antibiotic to help. It will need to run its course and she may start feeling better towards the end of this week. Keep well hydrated and continue the routine asthma meds. Please ask her to have Dr Ronnald Ramp- PCP- manage her acid blocker therapy.

## 2015-04-05 ENCOUNTER — Ambulatory Visit: Payer: Commercial Managed Care - HMO | Admitting: Podiatry

## 2015-04-05 ENCOUNTER — Other Ambulatory Visit: Payer: Self-pay | Admitting: Internal Medicine

## 2015-04-05 NOTE — Telephone Encounter (Signed)
Referral to Dr. Paulla Dolly done. We do not do referrals for dentists. That is separate coverage.

## 2015-04-07 HISTORY — PX: HEMORRHOID BANDING: SHX5850

## 2015-04-08 ENCOUNTER — Encounter (HOSPITAL_COMMUNITY): Payer: Self-pay | Admitting: Cardiology

## 2015-04-08 ENCOUNTER — Emergency Department (HOSPITAL_COMMUNITY): Payer: Commercial Managed Care - HMO

## 2015-04-08 ENCOUNTER — Emergency Department (HOSPITAL_COMMUNITY)
Admission: EM | Admit: 2015-04-08 | Discharge: 2015-04-08 | Disposition: A | Discharge status: Home / Self Care | Payer: Commercial Managed Care - HMO | Attending: Emergency Medicine | Admitting: Emergency Medicine

## 2015-04-08 DIAGNOSIS — K219 Gastro-esophageal reflux disease without esophagitis: Secondary | ICD-10-CM | POA: Diagnosis not present

## 2015-04-08 DIAGNOSIS — E119 Type 2 diabetes mellitus without complications: Secondary | ICD-10-CM | POA: Insufficient documentation

## 2015-04-08 DIAGNOSIS — Z794 Long term (current) use of insulin: Secondary | ICD-10-CM | POA: Insufficient documentation

## 2015-04-08 DIAGNOSIS — I10 Essential (primary) hypertension: Secondary | ICD-10-CM | POA: Insufficient documentation

## 2015-04-08 DIAGNOSIS — Z9889 Other specified postprocedural states: Secondary | ICD-10-CM | POA: Insufficient documentation

## 2015-04-08 DIAGNOSIS — R079 Chest pain, unspecified: Secondary | ICD-10-CM | POA: Diagnosis present

## 2015-04-08 DIAGNOSIS — Z7982 Long term (current) use of aspirin: Secondary | ICD-10-CM | POA: Diagnosis not present

## 2015-04-08 DIAGNOSIS — Z87891 Personal history of nicotine dependence: Secondary | ICD-10-CM | POA: Diagnosis not present

## 2015-04-08 DIAGNOSIS — R251 Tremor, unspecified: Secondary | ICD-10-CM | POA: Insufficient documentation

## 2015-04-08 DIAGNOSIS — G8929 Other chronic pain: Secondary | ICD-10-CM | POA: Insufficient documentation

## 2015-04-08 DIAGNOSIS — Z7951 Long term (current) use of inhaled steroids: Secondary | ICD-10-CM | POA: Insufficient documentation

## 2015-04-08 DIAGNOSIS — Z79899 Other long term (current) drug therapy: Secondary | ICD-10-CM | POA: Insufficient documentation

## 2015-04-08 DIAGNOSIS — E039 Hypothyroidism, unspecified: Secondary | ICD-10-CM | POA: Diagnosis not present

## 2015-04-08 DIAGNOSIS — Z8739 Personal history of other diseases of the musculoskeletal system and connective tissue: Secondary | ICD-10-CM | POA: Insufficient documentation

## 2015-04-08 DIAGNOSIS — F419 Anxiety disorder, unspecified: Secondary | ICD-10-CM | POA: Insufficient documentation

## 2015-04-08 DIAGNOSIS — F329 Major depressive disorder, single episode, unspecified: Secondary | ICD-10-CM | POA: Diagnosis not present

## 2015-04-08 DIAGNOSIS — J441 Chronic obstructive pulmonary disease with (acute) exacerbation: Secondary | ICD-10-CM | POA: Diagnosis not present

## 2015-04-08 DIAGNOSIS — Z91040 Latex allergy status: Secondary | ICD-10-CM | POA: Diagnosis not present

## 2015-04-08 DIAGNOSIS — K5900 Constipation, unspecified: Secondary | ICD-10-CM | POA: Diagnosis not present

## 2015-04-08 LAB — COMPREHENSIVE METABOLIC PANEL
ALT: 41 U/L (ref 14–54)
AST: 31 U/L (ref 15–41)
Albumin: 3.8 g/dL (ref 3.5–5.0)
Alkaline Phosphatase: 92 U/L (ref 38–126)
Anion gap: 8 (ref 5–15)
BUN: 12 mg/dL (ref 6–20)
CO2: 29 mmol/L (ref 22–32)
Calcium: 9 mg/dL (ref 8.9–10.3)
Chloride: 99 mmol/L — ABNORMAL LOW (ref 101–111)
Creatinine, Ser: 0.64 mg/dL (ref 0.44–1.00)
GFR calc Af Amer: >60 mL/min (ref >60)
GFR calc non Af Amer: >60 mL/min (ref >60)
Glucose, Bld: 165 mg/dL — ABNORMAL HIGH (ref 65–99)
Potassium: 3.6 mmol/L (ref 3.5–5.1)
Sodium: 136 mmol/L (ref 135–145)
Total Bilirubin: 0.4 mg/dL (ref 0.3–1.2)
Total Protein: 6.7 g/dL (ref 6.5–8.1)

## 2015-04-08 LAB — CBC
HCT: 38.2 % (ref 36.0–46.0)
Hemoglobin: 12.9 g/dL (ref 12.0–15.0)
MCH: 30.7 pg (ref 26.0–34.0)
MCHC: 33.8 g/dL (ref 30.0–36.0)
MCV: 91 fL (ref 78.0–100.0)
Platelets: 162 10*3/uL (ref 150–400)
RBC: 4.2 MIL/uL (ref 3.87–5.11)
RDW: 12.8 % (ref 11.5–15.5)
WBC: 6.5 10*3/uL (ref 4.0–10.5)

## 2015-04-08 LAB — PROTIME-INR
INR: 1.05 (ref 0.00–1.49)
Prothrombin Time: 13.9 seconds (ref 11.6–15.2)

## 2015-04-08 LAB — I-STAT TROPONIN, ED: Troponin i, poc: 0 ng/mL (ref 0.00–0.08)

## 2015-04-08 MED ORDER — SODIUM CHLORIDE 0.9 % IV SOLN
1000.0000 mL | INTRAVENOUS | Status: DC
  Administered 2015-04-08: 1000 mL via INTRAVENOUS

## 2015-04-08 MED ORDER — GI COCKTAIL ~~LOC~~
30.0000 mL | Freq: Once | ORAL | Status: AC
  Administered 2015-04-08: 30 mL via ORAL
  Filled 2015-04-08: qty 30

## 2015-04-08 NOTE — ED Notes (Addendum)
Chest  pain times 1 hour.  CBG 224  C/o increased wheezing and sob.

## 2015-04-08 NOTE — ED Provider Notes (Signed)
CSN: [id number]     Arrival date & time 04/08/15  1002 History  By signing my name below, I, Rayna Sexton, attest that this documentation has been prepared under the direction and in the presence of Dorie Rank, MD. Electronically Signed: Rayna Sexton, ED Scribe. 04/08/2015. 10:44 AM.     Chief Complaint  Patient presents with  . Chest Pain   The history is provided by the patient. No language interpreter was used.     HPI Comments: [name] is a 65 y.o. female with a hx of DM, COPD, and dysrhythmia who presents to the Emergency Department complaining of an episode of sharp, moderate, central CP which occurred this morning s/p eating. She notes a radiation of pain to her left and right chest with associated SOB. Pt confirms that her CP has somewhat alleviated. Pt notes taking a duoneb and using her albuterol inhaler PTA which provided some mild relief. Pt denies any n/v or other associated symptoms at this time.    Past Medical History  Diagnosis Date  . 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 [name], residual palpitations  . Vocal cord dysfunction   . Tremor, essential     takes Mysoline and Neurontin daily.  . Allergic rhinitis     uses Nasonex daily as needed  . Low sodium syndrome   . Chronic back pain     reason unknown  . Lumbar radiculopathy   . DDD (degenerative disc disease), lumbar   . Esophageal reflux     takes Zantac daily  . Hypertension     takes Losartan daily  . Anxiety     takes Ativan daily as needed  . Hypothyroidism     takes Synthroid daily  . Depression     takes Zoloft daily as well as Cymbalta  . Diabetes mellitus     takes NOvolin daily  . Asthma     Albuterol inhaler and Neb daily as needed  . History of bronchitis Dec 2014  . Seizures ([place]) 04-05-13    one time ran out of Primidone and had stopped it cold [place]  . Weakness     in left arm  . Joint pain   . Joint swelling     left ankle   . Dysphagia   . Constipation   . Allergy to angiotensin receptor blockers (ARB) 01/22/2014    Angioedema  . 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 Rayann Heman 2007   Past Surgical History  Procedure Laterality Date  . Tonsillectomy    . Right breast cyst      benign  . Left ankle ligament repair      x 2  . Left elbow repair    . Partial hysterectomy    . Cholecystectomy    . Cesarean section    . Ablation for avnrt    . Colonoscopy  01/16/2004    [id number] rectum/colon  . Esophagogastroduodenoscopy (egd) with esophageal dilation  04/03/2002    [id number] ring, otherwise normal esophagus, status post dilation with 54 F/Normal stomach  . Bartholin gland cyst excision      x 2  . Esophagogastroduodenoscopy (egd) with esophageal dilation N/A 11/08/2012    Dr. [name]:schatzki's ring s/p dilation/hiatal hernia  . Subthalamic stimulator insertion Bilateral 12/04/2013    Procedure: Bilateral Deep brain stimulator placement;  Surgeon: [name], MD;  Location: [facility];  Service: Neurosurgery;  Laterality: Bilateral;  Bilateral Deep brain stimulator placement  . Pulse generator implant Bilateral 12/15/2013    Procedure: BILATERAL PULSE GENERATOR IMPLANT;  Surgeon: [name], MD;  Location: [facility];  Service: Neurosurgery;  Laterality: Bilateral;  BILATERAL  . Maximum access (mas)posterior lumbar interbody fusion (plif) 1 level N/A 04/24/2014    Procedure: Lumbar four-five Maximum access Surgery posterior lumbar interbody fusion;  Surgeon: [name], MD;  Location: [facility];  Service: Neurosurgery;  Laterality: N/A;  Lumbar four-five Maximum access Surgery posterior lumbar interbody fusion  . Colonoscopy with propofol N/A 03/04/2015    Procedure: COLONOSCOPY WITH PROPOFOL at cecum at 1011; withdrawal time=16minutes;  Surgeon: Daneil Dolin, MD;  Location: [facility];  Service: Endoscopy;  Laterality: N/A;  . Esophagogastroduodenoscopy (egd) with propofol N/A 03/04/2015     Procedure: ESOPHAGOGASTRODUODENOSCOPY (EGD) WITH PROPOFOL Procedure #1;  Surgeon: Daneil Dolin, MD;  Location: [facility];  Service: Endoscopy;  Laterality: N/A;  . Esophageal dilation N/A 03/04/2015    Procedure: ESOPHAGEAL DILATION WITH 54FR MALONEY DILATOR;  Surgeon: Daneil Dolin, MD;  Location: [facility];  Service: Endoscopy;  Laterality: N/A;  . Esophageal biopsy  03/04/2015    Procedure: BIOPSY (Duodenal, Gastric);  Surgeon: Daneil Dolin, MD;  Location: [facility];  Service: Endoscopy;;  . Polypectomy  03/04/2015    Procedure: POLYPECTOMY (descending colon);  Surgeon: Daneil Dolin, MD;  Location: [facility];  Service: Endoscopy;;   Family History  Problem Relation Age of Onset  . Lung cancer Father     DIED AGE 50 LUNG CA  . Alcohol abuse Father   . Anxiety disorder Father   . Depression Father   . COPD Mother     DIED AGE 27,MRSA,COPD,PNEUMONIA  . Pneumonia Mother     DIED AGE 27,MRSA,COPD,PNEUMONIA  . Supraventricular tachycardia Mother   . Anxiety disorder Mother   . Depression Mother   . Alcohol abuse Mother   . COPD Brother     AGE 66  . Heart disease Sister     DIED AGE 70, SMOKER,?HEART  . Colon cancer Neg Hx    Social History  Substance Use Topics  . Smoking status: Former Smoker -- 0.30 packs/day for 10 years    Types: Cigarettes    Quit date: 04/23/1993  . Smokeless tobacco: Never Used     Comment: quit smoking 25+yrs ago  . Alcohol Use: No     Comment: no alcohol in 80yrs   OB History    Gravida Para Term Preterm AB TAB SAB Ectopic Multiple Living   2 2 2       1      Review of Systems  Respiratory: Positive for shortness of breath.   Cardiovascular: Positive for chest pain.  Gastrointestinal: Negative for nausea and vomiting.  All other systems reviewed and are negative.   Allergies  Invokana; Losartan; Metformin and related; Latex; Morphine and related; Other; and Oxycodone-acetaminophen  Home Medications   Prior to Admission medications    Medication Sig Start Date End Date Taking? Authorizing Provider  albuterol (PROVENTIL) (2.5 MG/3ML) 0.083% nebulizer solution Use every 4 hours as needed.  Pt may keep this medicine in her room Patient taking differently: Take 2.5 mg by nebulization every 4 (four) hours as needed for wheezing or shortness of breath.  03/20/15  Yes [name], MD  aluminum hydroxide-magnesium carbonate (GAVISCON) 95-358 MG/15ML SUSP 2 tablespoons every 6 hours for indigestion 03/25/15  Yes Deneise Lever, MD  ASPIRIN LOW DOSE 81 MG  EC tablet TAKE 1 TABLET BY MOUTH ONCE DAILY. 01/12/15  Yes Janith Lima, MD  bisacodyl (DULCOLAX) 10 MG suppository Place 1 suppository (10 mg total) rectally as needed for moderate constipation. 02/25/15  Yes Janith Lima, MD  canagliflozin (INVOKANA) 300 MG TABS tablet Take 300 mg by mouth daily before breakfast.   Yes Historical Provider, MD  Fluticasone Furoate-Vilanterol (BREO ELLIPTA) 100-25 MCG/INH AEPB Inhale 1 puff then rinse mouth, once daily Patient taking differently: Inhale 1 puff into the lungs daily. Inhale 1 puff then rinse mouth, once daily 02/08/15  Yes Deneise Lever, MD  gabapentin (NEURONTIN) 300 MG capsule Take 300-600 mg by mouth 2 (two) times daily. 1 capsule in the morning and 2 at bedtime   Yes Historical Provider, MD  guaiFENesin (ROBITUSSIN) 100 MG/5ML liquid Take 200 mg by mouth 3 (three) times daily as needed for cough.   Yes Historical Provider, MD  HYDROcodone-acetaminophen (NORCO) 10-325 MG tablet Take 1 tablet by mouth every 6 (six) hours as needed for moderate pain. 03/19/15  Yes Janith Lima, MD  Insulin Glargine (TOUJEO SOLOSTAR) 300 UNIT/ML SOPN Inject 50 Units into the skin daily.   Yes Historical Provider, MD  insulin lispro (HUMALOG) 100 UNIT/ML injection Inject 5 Units into the skin 3 (three) times daily before meals.   Yes Historical Provider, MD  ipratropium-albuterol (DUONEB) 0.5-2.5 (3) MG/3ML SOLN Take 3 mLs by nebulization 4 (four)  times daily. Dx code J45.50 03/28/15  Yes Deneise Lever, MD  levothyroxine (SYNTHROID, LEVOTHROID) 75 MCG tablet TAKE (1) TABLET BY MOUTH ONCE DAILY BEFORE BREAKFAST. 01/12/15  Yes Janith Lima, MD  lidocaine (LIDODERM) 5 % Place 1 patch onto the skin daily. Remove & Discard patch within 12 hours or as directed by MD 03/20/15  Yes Janith Lima, MD  metoprolol succinate (TOPROL-XL) 50 MG 24 hr tablet TAKE 1 TABLET BY MOUTH ONCE DAILY. 01/12/15  Yes Janith Lima, MD  Multiple Vitamins-Minerals (MULTIVITAMINS THER. W/MINERALS) TABS tablet Take 1 tablet by mouth daily.   Yes Historical Provider, MD  Omega-3 Fatty Acids (FISH OIL PO) Take 1 capsule by mouth daily.   Yes Historical Provider, MD  primidone (MYSOLINE) 50 MG tablet Take 2 tablets (100 mg total) by mouth 2 (two) times daily. Patient taking differently: Take 150 mg by mouth 2 (two) times daily.  03/06/15  Yes Rebecca S Tat, DO  PROAIR HFA 108 (90 BASE) MCG/ACT inhaler Take 2 puffs by mouth every 6 (six) hours as needed for wheezing or shortness of breath.  12/27/14  Yes Historical Provider, MD  ranitidine (ZANTAC) 150 MG capsule Take 150 mg by mouth 2 (two) times daily.   Yes Historical Provider, MD  tiZANidine (ZANAFLEX) 4 MG tablet TAKE 1 TABLET BY MOUTH AT BEDTIME AS NEEDED FOR MUSCLE SPASMS. 03/19/15  Yes Janith Lima, MD  triamterene-hydrochlorothiazide (MAXZIDE-25) 37.5-25 MG tablet TAKE 1 TABLET BY MOUTH ONCE DAILY. 01/12/15  Yes Janith Lima, MD  Vilazodone HCl (VIIBRYD) 40 MG TABS Take 40 mg by mouth daily.   Yes Historical Provider, MD   BP 117/68 mmHg  Pulse 87  Temp(Src) 98.5 F (36.9 C) (Oral)  Resp 22  Ht 5\' 4"  (1.626 m)  Wt 81.194 kg  BMI 30.71 kg/m2  SpO2 96% Physical Exam  Constitutional: She appears well-developed and well-nourished. No distress.  HENT:  Head: Normocephalic and atraumatic.  Right Ear: External ear normal.  Left Ear: External ear normal.  Eyes: Conjunctivae are normal. Right  eye exhibits no  discharge. Left eye exhibits no discharge. No scleral icterus.  Neck: Neck supple. No tracheal deviation present.  Cardiovascular: Normal rate, regular rhythm and intact distal pulses.   Pulmonary/Chest: Effort normal and breath sounds normal. No stridor. No respiratory distress. She has no wheezes. She has no rales.  Abdominal: Soft. Bowel sounds are normal. She exhibits no distension. There is no tenderness. There is no rebound and no guarding.  Musculoskeletal: She exhibits no edema or tenderness.  Neurological: She is alert. She has normal strength. She displays tremor. No cranial nerve deficit (no facial droop, extraocular movements intact, no slurred speech) or sensory deficit. She exhibits normal muscle tone. She displays no seizure activity. Coordination normal.  Slow speech  Skin: Skin is warm and dry. No rash noted.  Psychiatric: She has a normal mood and affect.  Nursing note and vitals reviewed.   ED Course  Procedures  DIAGNOSTIC STUDIES: Oxygen Saturation is 99% on RA, normal by my interpretation.    COORDINATION OF CARE: 10:42 AM Pt presents today due to CP. Discussed next steps with pt and she agreed to the plan.   Labs Review Labs Reviewed  COMPREHENSIVE METABOLIC PANEL - Abnormal; Notable for the following:    Chloride 99 (*)    Glucose, Bld 165 (*)    All other components within normal limits  CBC  PROTIME-INR  I-STAT TROPOININ, ED  Randolm Idol, ED    Imaging Review Dg Chest 2 View  04/08/2015  CLINICAL DATA:  Chest pain since this morning. EXAM: CHEST  2 VIEW COMPARISON:  03/21/2015 FINDINGS: The heart size and mediastinal contours are within normal limits. Both lungs are clear. The visualized skeletal structures are unremarkable. Bilateral battery packs for stimulator device noted. Telemetry leads overlie the chest. IMPRESSION: No active cardiopulmonary disease. Electronically Signed   By: Misty Stanley M.D.   On: 04/08/2015 11:43     EKG  Interpretation   Date/Time:  Monday April 08 2015 10:19:30 EST Ventricular Rate:  94 PR Interval:  167 QRS Duration: 87 QT Interval:  353 QTC Calculation: 441 R Axis:   63 Text Interpretation:  Sinus rhythm No significant change since last  tracing Confirmed by Britian Jentz  MD-J, Reiana Poteet ([id number]) on 04/08/2015 10:33:42 AM      MDM   Final diagnoses:  Gastroesophageal reflux disease, esophagitis presence not specified    The patient's symptoms are suggestive of esophageal reflux. She has had this type of problem in the past. She was given a dose of a GI cocktail with good relief. The patient's laboratory tests and chest x-ray are reassuring. EKG is unchanged. I doubt that her symptoms are related to acute coronary syndrome, pulmonary embolism or other emergency medical condition  At this time there does not appear to be any evidence of an acute emergency medical condition and the patient appears stable for discharge with appropriate outpatient follow up.  I personally performed the services described in this documentation, which was scribed in my presence.  The recorded information has been reviewed and is accurate.    Dorie Rank, MD 04/08/15 1330

## 2015-04-08 NOTE — Discharge Instructions (Signed)

## 2015-04-09 ENCOUNTER — Encounter: Payer: Self-pay | Admitting: Internal Medicine

## 2015-04-09 ENCOUNTER — Telehealth: Payer: Self-pay | Admitting: Internal Medicine

## 2015-04-09 ENCOUNTER — Ambulatory Visit (INDEPENDENT_AMBULATORY_CARE_PROVIDER_SITE_OTHER): Payer: Commercial Managed Care - HMO | Admitting: Internal Medicine

## 2015-04-09 VITALS — BP 118/70 | HR 82 | Temp 98.1°F | Resp 16 | Ht 64.0 in | Wt 173.0 lb

## 2015-04-09 DIAGNOSIS — M4806 Spinal stenosis, lumbar region: Secondary | ICD-10-CM

## 2015-04-09 DIAGNOSIS — M48061 Spinal stenosis, lumbar region without neurogenic claudication: Secondary | ICD-10-CM

## 2015-04-09 DIAGNOSIS — K2100 Gastro-esophageal reflux disease with esophagitis, without bleeding: Secondary | ICD-10-CM

## 2015-04-09 DIAGNOSIS — I10 Essential (primary) hypertension: Secondary | ICD-10-CM

## 2015-04-09 MED ORDER — SUCRALFATE 1 G PO TABS: 1.0000 g | ORAL_TABLET | Freq: Three times a day (TID) | ORAL | Status: DC

## 2015-04-09 MED ORDER — HYDROCODONE-ACETAMINOPHEN 10-325 MG PO TABS: 1.0000 | ORAL_TABLET | Freq: Four times a day (QID) | ORAL | Status: DC | PRN

## 2015-04-09 NOTE — Patient Instructions (Signed)

## 2015-04-09 NOTE — Progress Notes (Signed)
Pre visit review using our clinic review tool, if applicable. No additional management support is needed unless otherwise documented below in the visit note. 

## 2015-04-09 NOTE — Assessment & Plan Note (Signed)
She will cont to f/up with her GI doctor about this

## 2015-04-09 NOTE — Telephone Encounter (Signed)
lmtcb x1 for pt. 

## 2015-04-09 NOTE — Telephone Encounter (Signed)
Ms. Bily came back in the office and requested that the fax be sent with a hard copy fax and not electronically.

## 2015-04-09 NOTE — Progress Notes (Signed)
Subjective:  Patient ID: [name], female    DOB: [birth year]  Age: 65 y.o. MRN: [id number]  CC: Gastroesophageal Reflux and Back Pain   HPI [name] presents for follow-up after a recent ER visit. One day prior to today she was seen in the ER for stabbing pain in her sternum. She now attributes it to her esophagus. She has a history of esophageal spasm and stenosis. After being seen in emergency room yesterday she realized that she was not receiving her Carafate accurately. Now that she has been dosed appropriately she feels like the pain and spasm in her esophagus has resolved. She feels well today and offers no complaints. She requests a refill on hydrocodone for back pain.  Outpatient Prescriptions Prior to Visit  Medication Sig Dispense Refill  . albuterol (PROVENTIL) (2.5 MG/3ML) 0.083% nebulizer solution Use every 4 hours as needed.  Pt may keep this medicine in her room (Patient taking differently: Take 2.5 mg by nebulization every 4 (four) hours as needed for wheezing or shortness of breath. ) 30 vial 1  . aluminum hydroxide-magnesium carbonate (GAVISCON) 95-358 MG/15ML SUSP 2 tablespoons every 6 hours for indigestion 355 mL prn  . ASPIRIN LOW DOSE 81 MG EC tablet TAKE 1 TABLET BY MOUTH ONCE DAILY. 30 tablet 11  . bisacodyl (DULCOLAX) 10 MG suppository Place 1 suppository (10 mg total) rectally as needed for moderate constipation. 12 suppository 0  . canagliflozin (INVOKANA) 300 MG TABS tablet Take 300 mg by mouth daily before breakfast.    . Fluticasone Furoate-Vilanterol (BREO ELLIPTA) 100-25 MCG/INH AEPB Inhale 1 puff then rinse mouth, once daily (Patient taking differently: Inhale 1 puff into the lungs daily. Inhale 1 puff then rinse mouth, once daily) 60 each 11  . gabapentin (NEURONTIN) 300 MG capsule Take 300-600 mg by mouth 2 (two) times daily. 1 capsule in the morning and 2 at bedtime    . guaiFENesin (ROBITUSSIN) 100 MG/5ML liquid Take 200 mg by mouth 3 (three) times  daily as needed for cough.    . Insulin Glargine (TOUJEO SOLOSTAR) 300 UNIT/ML SOPN Inject 50 Units into the skin daily.    . insulin lispro (HUMALOG) 100 UNIT/ML injection Inject 5 Units into the skin 3 (three) times daily before meals.    Marland Kitchen ipratropium-albuterol (DUONEB) 0.5-2.5 (3) MG/3ML SOLN Take 3 mLs by nebulization 4 (four) times daily. Dx code J45.50 150 mL 12  . levothyroxine (SYNTHROID, LEVOTHROID) 75 MCG tablet TAKE (1) TABLET BY MOUTH ONCE DAILY BEFORE BREAKFAST. 30 tablet 5  . lidocaine (LIDODERM) 5 % Place 1 patch onto the skin daily. Remove & Discard patch within 12 hours or as directed by MD 90 patch 3  . metoprolol succinate (TOPROL-XL) 50 MG 24 hr tablet TAKE 1 TABLET BY MOUTH ONCE DAILY. 30 tablet 11  . Multiple Vitamins-Minerals (MULTIVITAMINS THER. W/MINERALS) TABS tablet Take 1 tablet by mouth daily.    Marland Kitchen nystatin (MYCOSTATIN) 100000 UNIT/ML suspension TAKE 5 MLS BY MOUTH 4 TIMES DAILY AS NEEDED. 60 mL 11  . Omega-3 Fatty Acids (FISH OIL PO) Take 1 capsule by mouth daily.    . primidone (MYSOLINE) 50 MG tablet Take 2 tablets (100 mg total) by mouth 2 (two) times daily. (Patient taking differently: Take 150 mg by mouth 2 (two) times daily. ) 360 tablet 1  . PROAIR HFA 108 (90 BASE) MCG/ACT inhaler Take 2 puffs by mouth every 6 (six) hours as needed for wheezing or shortness of breath.     Marland Kitchen  ranitidine (ZANTAC) 150 MG capsule Take 150 mg by mouth 2 (two) times daily.    Marland Kitchen tiZANidine (ZANAFLEX) 4 MG tablet TAKE 1 TABLET BY MOUTH AT BEDTIME AS NEEDED FOR MUSCLE SPASMS. 30 tablet 11  . triamterene-hydrochlorothiazide (MAXZIDE-25) 37.5-25 MG tablet TAKE 1 TABLET BY MOUTH ONCE DAILY. 30 tablet 11  . Vilazodone HCl (VIIBRYD) 40 MG TABS Take 40 mg by mouth daily.    Marland Kitchen HYDROcodone-acetaminophen (NORCO) 10-325 MG tablet Take 1 tablet by mouth every 6 (six) hours as needed for moderate pain. 75 tablet 0   No facility-administered medications prior to visit.    ROS Review of Systems    Constitutional: Negative.  Negative for fever, chills, diaphoresis, appetite change and fatigue.  HENT: Negative.  Negative for congestion, sinus pressure, sore throat, trouble swallowing and voice change.   Eyes: Negative.   Respiratory: Negative.  Negative for cough, choking, chest tightness, shortness of breath and stridor.   Cardiovascular: Negative.  Negative for chest pain, palpitations and leg swelling.  Gastrointestinal: Negative.  Negative for nausea, vomiting, abdominal pain, diarrhea, constipation and blood in stool.  Endocrine: Negative.   Genitourinary: Negative.  Negative for dysuria, urgency, frequency, decreased urine volume and difficulty urinating.  Musculoskeletal: Positive for back pain. Negative for myalgias, joint swelling and arthralgias.  Skin: Negative.   Allergic/Immunologic: Negative.   Neurological: Negative.   Hematological: Negative.  Negative for adenopathy. Does not bruise/bleed easily.  Psychiatric/Behavioral: Negative.     Objective:  BP 118/70 mmHg  Pulse 82  Temp(Src) 98.1 F (36.7 C) (Oral)  Resp 16  Ht 5\' 4"  (1.626 m)  Wt 173 lb (78.472 kg)  BMI 29.68 kg/m2  SpO2 97%  BP Readings from Last 3 Encounters:  04/09/15 118/70  04/08/15 117/68  03/28/15 108/80    Wt Readings from Last 3 Encounters:  04/09/15 173 lb (78.472 kg)  04/08/15 179 lb (81.194 kg)  03/28/15 170 lb (77.111 kg)    Physical Exam  Constitutional: She is oriented to person, place, and time. No distress.  HENT:  Head: Normocephalic and atraumatic.  Mouth/Throat: Oropharynx is clear and moist. No oropharyngeal exudate.  Eyes: Conjunctivae are normal. Right eye exhibits no discharge. Left eye exhibits no discharge. No scleral icterus.  Neck: Normal range of motion. Neck supple. No JVD present. No tracheal deviation present. No thyromegaly present.  Cardiovascular: Normal rate, regular rhythm, normal heart sounds and intact distal pulses.  Exam reveals no gallop and no  friction rub.   No murmur heard. Pulmonary/Chest: Effort normal and breath sounds normal. No stridor. No respiratory distress. She has no wheezes. She has no rales. She exhibits no tenderness.  Abdominal: Soft. Bowel sounds are normal. She exhibits no distension and no mass. There is no tenderness. There is no rebound and no guarding.  Musculoskeletal: She exhibits no edema or tenderness.  Lymphadenopathy:    She has no cervical adenopathy.  Neurological: She is oriented to person, place, and time.  Skin: Skin is warm and dry. No rash noted. She is not diaphoretic. No erythema. No pallor.  Vitals reviewed.   Lab Results  Component Value Date   WBC 6.5 04/08/2015   HGB 12.9 04/08/2015   HCT 38.2 04/08/2015   PLT 162 04/08/2015   GLUCOSE 165* 04/08/2015   CHOL 180 02/13/2015   TRIG 175.0* 02/13/2015   HDL 64.80 02/13/2015   LDLCALC 80 02/13/2015   ALT 41 04/08/2015   AST 31 04/08/2015   NA 136 04/08/2015   K 3.6  04/08/2015   CL 99* 04/08/2015   CREATININE 0.64 04/08/2015   BUN 12 04/08/2015   CO2 29 04/08/2015   TSH 1.18 02/13/2015   INR 1.05 04/08/2015   HGBA1C 7.0* 02/13/2015   MICROALBUR 1.7 02/13/2015    Dg Chest 2 View  04/08/2015  CLINICAL DATA:  Chest pain since this morning. EXAM: CHEST  2 VIEW COMPARISON:  03/21/2015 FINDINGS: The heart size and mediastinal contours are within normal limits. Both lungs are clear. The visualized skeletal structures are unremarkable. Bilateral battery packs for stimulator device noted. Telemetry leads overlie the chest. IMPRESSION: No active cardiopulmonary disease. Electronically Signed   By: Misty Stanley M.D.   On: 04/08/2015 11:43    Assessment & Plan:   Raul was seen today for gastroesophageal reflux and back pain.  Diagnoses and all orders for this visit:  Spinal stenosis of lumbar region -     HYDROcodone-acetaminophen (NORCO) 10-325 MG tablet; Take 1 tablet by mouth every 6 (six) hours as needed for moderate  pain.  Other orders -     sucralfate (CARAFATE) 1 g tablet; Take 1 tablet (1 g total) by mouth 4 (four) times daily -  before meals and at bedtime. Pt is to take an hour before meals   I have discontinued Ms. Takahashi's CARAFATE. I have also changed her sucralfate. Additionally, I am having her maintain her ASPIRIN LOW DOSE, metoprolol succinate, triamterene-hydrochlorothiazide, levothyroxine, Fluticasone Furoate-Vilanterol, bisacodyl, primidone, tiZANidine, lidocaine, albuterol, PROAIR HFA, aluminum hydroxide-magnesium carbonate, ipratropium-albuterol, nystatin, gabapentin, insulin lispro, canagliflozin, guaiFENesin, Insulin Glargine, multivitamins ther. w/minerals, Omega-3 Fatty Acids (FISH OIL PO), ranitidine, Vilazodone HCl, ACCU-CHEK AVIVA PLUS, and HYDROcodone-acetaminophen.  Meds ordered this encounter  Medications  . DISCONTD: CARAFATE 1 GM/10ML suspension    Sig:   . sucralfate (CARAFATE) 1 g tablet    Sig: Take 1 tablet (1 g total) by mouth 4 (four) times daily -  before meals and at bedtime. Pt is to take an hour before meals    Dispense:  120 tablet    Refill:  1  . ACCU-CHEK AVIVA PLUS test strip    Sig:   . HYDROcodone-acetaminophen (NORCO) 10-325 MG tablet    Sig: Take 1 tablet by mouth every 6 (six) hours as needed for moderate pain.    Dispense:  75 tablet    Refill:  0     Follow-up: No Follow-up on file.  Scarlette Calico, MD

## 2015-04-09 NOTE — Assessment & Plan Note (Signed)
Her BP is well controlled 

## 2015-04-10 NOTE — Telephone Encounter (Signed)
lmtcb

## 2015-04-11 NOTE — Telephone Encounter (Signed)
Pt calling stating that they need a hard copy of med's faxed to them @ [phone] she said lindsay would know what she was talking about, said that she had spoken to her.Hillery Hunter

## 2015-04-11 NOTE — Telephone Encounter (Signed)
Spoke with pt. Advised her that we are still waiting on CY's response.  CY - please advise. Thanks.

## 2015-04-11 NOTE — Telephone Encounter (Signed)
Ok to refill prometh codeine cough syrup. Please advise her to use it sparingly, only if really needed- can be habit forming.  Will send letter later.

## 2015-04-11 NOTE — Telephone Encounter (Signed)
Spoke with pt. She is requesting a refill on Phenergan-Codeine cough syrup. This was last filled on 03/19/15. She is also needing a letter to written stating that she can keep her albuterol and duoneb in her room. The letter also needs to state that she can self administer her albuterol and duoneb. CY please advise. Thanks.

## 2015-04-11 NOTE — Telephone Encounter (Signed)
LMTCB x1 for pt.  

## 2015-04-12 ENCOUNTER — Telehealth: Payer: Self-pay | Admitting: Internal Medicine

## 2015-04-12 ENCOUNTER — Encounter: Payer: Self-pay | Admitting: Internal Medicine

## 2015-04-12 MED ORDER — LORATADINE 10 MG PO TABS: 10.0000 mg | ORAL_TABLET | Freq: Every day | ORAL | Status: DC | PRN

## 2015-04-12 MED ORDER — PROMETHAZINE-CODEINE 6.25-10 MG/5ML PO SYRP: 5.0000 mL | ORAL_SOLUTION | Freq: Four times a day (QID) | ORAL | Status: DC | PRN

## 2015-04-12 NOTE — Telephone Encounter (Signed)
1 

## 2015-04-12 NOTE — Telephone Encounter (Deleted)
Spoke with pt. She is aware that we are going to send over her prescription and letter. While speaking to her she reports increased coughing with production of milky mucus and wheezing. Wants to know if CY will call something in.  CY - please advise.  Thanks.

## 2015-04-12 NOTE — Telephone Encounter (Signed)
Spoke with pt. Reports increased coughing with production of milky mucus and wheezing. There is another message about a refill on promethazine-codeine cough syrup. Wants to know if CY will call something in.  Allergies  Allergen Reactions  . Invokana [Canagliflozin] Other (See Comments)    Yeast infectios  . Losartan     Angioedema   . Metformin And Related Diarrhea  . Latex Other (See Comments)    Other Reaction: redness, blisters  . Morphine And Related Other (See Comments)    Pt. States med makes her crazy  . Other Other (See Comments)    Other Reaction: redness, blisters  . Oxycodone-Acetaminophen Itching    TYLOX. Caused internal itching per patient    Current Outpatient Prescriptions on File Prior to Visit  Medication Sig Dispense Refill  . ACCU-CHEK AVIVA PLUS test strip     . albuterol (PROVENTIL) (2.5 MG/3ML) 0.083% nebulizer solution Use every 4 hours as needed.  Pt may keep this medicine in her room (Patient taking differently: Take 2.5 mg by nebulization every 4 (four) hours as needed for wheezing or shortness of breath. ) 30 vial 1  . aluminum hydroxide-magnesium carbonate (GAVISCON) 95-358 MG/15ML SUSP 2 tablespoons every 6 hours for indigestion 355 mL prn  . ASPIRIN LOW DOSE 81 MG EC tablet TAKE 1 TABLET BY MOUTH ONCE DAILY. 30 tablet 11  . bisacodyl (DULCOLAX) 10 MG suppository Place 1 suppository (10 mg total) rectally as needed for moderate constipation. 12 suppository 0  . canagliflozin (INVOKANA) 300 MG TABS tablet Take 300 mg by mouth daily before breakfast.    . Fluticasone Furoate-Vilanterol (BREO ELLIPTA) 100-25 MCG/INH AEPB Inhale 1 puff then rinse mouth, once daily (Patient taking differently: Inhale 1 puff into the lungs daily. Inhale 1 puff then rinse mouth, once daily) 60 each 11  . gabapentin (NEURONTIN) 300 MG capsule Take 300-600 mg by mouth 2 (two) times daily. 1 capsule in the morning and 2 at bedtime    . guaiFENesin (ROBITUSSIN) 100 MG/5ML liquid  Take 200 mg by mouth 3 (three) times daily as needed for cough.    Marland Kitchen HYDROcodone-acetaminophen (NORCO) 10-325 MG tablet Take 1 tablet by mouth every 6 (six) hours as needed for moderate pain. 75 tablet 0  . Insulin Glargine (TOUJEO SOLOSTAR) 300 UNIT/ML SOPN Inject 50 Units into the skin daily.    . insulin lispro (HUMALOG) 100 UNIT/ML injection Inject 5 Units into the skin 3 (three) times daily before meals.    Marland Kitchen ipratropium-albuterol (DUONEB) 0.5-2.5 (3) MG/3ML SOLN Take 3 mLs by nebulization 4 (four) times daily. Dx code J45.50 150 mL 12  . levothyroxine (SYNTHROID, LEVOTHROID) 75 MCG tablet TAKE (1) TABLET BY MOUTH ONCE DAILY BEFORE BREAKFAST. 30 tablet 5  . lidocaine (LIDODERM) 5 % Place 1 patch onto the skin daily. Remove & Discard patch within 12 hours or as directed by MD 90 patch 3  . metoprolol succinate (TOPROL-XL) 50 MG 24 hr tablet TAKE 1 TABLET BY MOUTH ONCE DAILY. 30 tablet 11  . Multiple Vitamins-Minerals (MULTIVITAMINS THER. W/MINERALS) TABS tablet Take 1 tablet by mouth daily.    Marland Kitchen nystatin (MYCOSTATIN) 100000 UNIT/ML suspension TAKE 5 MLS BY MOUTH 4 TIMES DAILY AS NEEDED. 60 mL 11  . Omega-3 Fatty Acids (FISH OIL PO) Take 1 capsule by mouth daily.    . primidone (MYSOLINE) 50 MG tablet Take 2 tablets (100 mg total) by mouth 2 (two) times daily. (Patient taking differently: Take 150 mg by mouth 2 (two) times daily. )  360 tablet 1  . PROAIR HFA 108 (90 BASE) MCG/ACT inhaler Take 2 puffs by mouth every 6 (six) hours as needed for wheezing or shortness of breath.     . ranitidine (ZANTAC) 150 MG capsule Take 150 mg by mouth 2 (two) times daily.    . sucralfate (CARAFATE) 1 g tablet Take 1 tablet (1 g total) by mouth 4 (four) times daily -  before meals and at bedtime. Pt is to take an hour before meals 120 tablet 1  . tiZANidine (ZANAFLEX) 4 MG tablet TAKE 1 TABLET BY MOUTH AT BEDTIME AS NEEDED FOR MUSCLE SPASMS. 30 tablet 11  . triamterene-hydrochlorothiazide (MAXZIDE-25) 37.5-25 MG  tablet TAKE 1 TABLET BY MOUTH ONCE DAILY. 30 tablet 11  . Vilazodone HCl (VIIBRYD) 40 MG TABS Take 40 mg by mouth daily.     No current facility-administered medications on file prior to visit.

## 2015-04-12 NOTE — Telephone Encounter (Signed)
She asks for antihistamine- offer Rx loratadine 10 mg, # 30, 1 daily as needed- antihistamine, refill prn

## 2015-04-12 NOTE — Telephone Encounter (Signed)
Pt calling stating that the order for the cough syrup has to be mailed to her, please advise.[name]

## 2015-04-12 NOTE — Telephone Encounter (Signed)
Pt is aware of recs. RX sent in. rx for phenergan mailed to her. Nothing further needed

## 2015-04-12 NOTE — Telephone Encounter (Signed)
  Per TE dated 04/09/15:   Deneise Lever, MD at 04/11/2015 4:24 PM     Status: Signed       Expand All Collapse All   Ok to refill prometh codeine cough syrup. Please advise her to use it sparingly, only if really needed- can be habit forming.  Will send letter later.       Called to advised patient that we are going to refill her cough syrup, patient requesting an antihystamine to be called in as well.  Dr. Annamaria Boots, please advise.

## 2015-04-12 NOTE — Telephone Encounter (Signed)
Duplicate TE.Marland Kitchen See TE dated 04/12/15 Closing this encounter

## 2015-04-12 NOTE — Telephone Encounter (Signed)
Pt calling about rx  Again.[name]

## 2015-04-15 ENCOUNTER — Other Ambulatory Visit: Payer: Self-pay | Admitting: Internal Medicine

## 2015-04-15 ENCOUNTER — Ambulatory Visit: Payer: Commercial Managed Care - HMO | Admitting: Podiatry

## 2015-04-15 ENCOUNTER — Telehealth: Payer: Self-pay | Admitting: Internal Medicine

## 2015-04-15 MED ORDER — FLUTICASONE FUROATE-VILANTEROL 100-25 MCG/INH IN AEPB: INHALATION_SPRAY | RESPIRATORY_TRACT | Status: DC

## 2015-04-15 MED ORDER — SALINE SPRAY 0.65 % NA SOLN: 1.0000 | NASAL | Status: DC | PRN

## 2015-04-15 MED ORDER — PROAIR HFA 108 (90 BASE) MCG/ACT IN AERS: 2.0000 | INHALATION_SPRAY | Freq: Four times a day (QID) | RESPIRATORY_TRACT | Status: DC | PRN

## 2015-04-15 NOTE — Telephone Encounter (Signed)
Dupree as requested to send script order dc'ng Allegra  Ok to Order Patient can self-administer benadryl 25 mg every 8 hours if needed antihistamine

## 2015-04-15 NOTE — Telephone Encounter (Signed)
Called and spoke to pt. Pt requested Breo and albuterol hfa refills sent to [facility]. Both rx's sent to preferred pharmacy. Pt also requested a written prescription be faxed to [facility] stating to discontinue the Allegra. Pt states she now uses just the Benadryl and it is effective in controlling her allergies.   Dr. Annamaria Boots please advise. Thanks.   Allergies  Allergen Reactions  . Invokana [Canagliflozin] Other (See Comments)    Yeast infectios  . Losartan     Angioedema   . Metformin And Related Diarrhea  . Latex Other (See Comments)    Other Reaction: redness, blisters  . Morphine And Related Other (See Comments)    Pt. States med makes her crazy  . Other Other (See Comments)    Other Reaction: redness, blisters  . Oxycodone-Acetaminophen Itching    TYLOX. Caused internal itching per patient     Current Outpatient Prescriptions on File Prior to Visit  Medication Sig Dispense Refill  . ACCU-CHEK AVIVA PLUS test strip     . albuterol (PROVENTIL) (2.5 MG/3ML) 0.083% nebulizer solution Use every 4 hours as needed.  Pt may keep this medicine in her room (Patient taking differently: Take 2.5 mg by nebulization every 4 (four) hours as needed for wheezing or shortness of breath. ) 30 vial 1  . aluminum hydroxide-magnesium carbonate (GAVISCON) 95-358 MG/15ML SUSP 2 tablespoons every 6 hours for indigestion 355 mL prn  . ASPIRIN LOW DOSE 81 MG EC tablet TAKE 1 TABLET BY MOUTH ONCE DAILY. 30 tablet 11  . bisacodyl (DULCOLAX) 10 MG suppository Place 1 suppository (10 mg total) rectally as needed for moderate constipation. 12 suppository 0  . canagliflozin (INVOKANA) 300 MG TABS tablet Take 300 mg by mouth daily before breakfast.    . gabapentin (NEURONTIN) 300 MG capsule Take 300-600 mg by mouth 2 (two) times daily. 1 capsule in the morning and 2 at bedtime    . guaiFENesin (ROBITUSSIN) 100 MG/5ML liquid Take 200 mg by mouth 3 (three) times daily as needed for cough.    Marland Kitchen HYDROcodone-acetaminophen  (NORCO) 10-325 MG tablet Take 1 tablet by mouth every 6 (six) hours as needed for moderate pain. 75 tablet 0  . Insulin Glargine (TOUJEO SOLOSTAR) 300 UNIT/ML SOPN Inject 50 Units into the skin daily.    . insulin lispro (HUMALOG) 100 UNIT/ML injection Inject 5 Units into the skin 3 (three) times daily before meals.    Marland Kitchen ipratropium-albuterol (DUONEB) 0.5-2.5 (3) MG/3ML SOLN Take 3 mLs by nebulization 4 (four) times daily. Dx code J45.50 150 mL 12  . levothyroxine (SYNTHROID, LEVOTHROID) 75 MCG tablet TAKE (1) TABLET BY MOUTH ONCE DAILY BEFORE BREAKFAST. 30 tablet 5  . lidocaine (LIDODERM) 5 % Place 1 patch onto the skin daily. Remove & Discard patch within 12 hours or as directed by MD 90 patch 3  . loratadine (CLARITIN) 10 MG tablet Take 1 tablet (10 mg total) by mouth daily as needed for allergies. 30 tablet prn  . metoprolol succinate (TOPROL-XL) 50 MG 24 hr tablet TAKE 1 TABLET BY MOUTH ONCE DAILY. 30 tablet 11  . Multiple Vitamins-Minerals (MULTIVITAMINS THER. W/MINERALS) TABS tablet Take 1 tablet by mouth daily.    Marland Kitchen nystatin (MYCOSTATIN) 100000 UNIT/ML suspension TAKE 5 MLS BY MOUTH 4 TIMES DAILY AS NEEDED. 60 mL 11  . Omega-3 Fatty Acids (FISH OIL PO) Take 1 capsule by mouth daily.    . primidone (MYSOLINE) 50 MG tablet Take 2 tablets (100 mg total) by mouth 2 (two) times  daily. (Patient taking differently: Take 150 mg by mouth 2 (two) times daily. ) 360 tablet 1  . promethazine-codeine (PHENERGAN WITH CODEINE) 6.25-10 MG/5ML syrup Take 5 mLs by mouth every 6 (six) hours as needed for cough. 200 mL 0  . ranitidine (ZANTAC) 150 MG capsule Take 150 mg by mouth 2 (two) times daily.    . sucralfate (CARAFATE) 1 g tablet Take 1 tablet (1 g total) by mouth 4 (four) times daily -  before meals and at bedtime. Pt is to take an hour before meals 120 tablet 1  . tiZANidine (ZANAFLEX) 4 MG tablet TAKE 1 TABLET BY MOUTH AT BEDTIME AS NEEDED FOR MUSCLE SPASMS. 30 tablet 11  .  triamterene-hydrochlorothiazide (MAXZIDE-25) 37.5-25 MG tablet TAKE 1 TABLET BY MOUTH ONCE DAILY. 30 tablet 11  . Vilazodone HCl (VIIBRYD) 40 MG TABS Take 40 mg by mouth daily.     No current facility-administered medications on file prior to visit.

## 2015-04-15 NOTE — Telephone Encounter (Signed)
Called spoke with patient and informed her that [facility] okayed for the dc order for the fexofenadine to be sent to [facility].  Pt stated she does not need an order to okay for her to self-administer the Benadryl - she believes she already has one on file.  Pt does request saline nasal spray be sent to the pharmacy as well and does not need a self-administer order for this as she also thinks she has one on file for the saline.  Saline not on med list, ok per Joellen Jersey to add/refill.  Pt stated she has had "2 attacks today" but everything CY has recommended for her to do has worked.  She is aware to call the office if this changes.    [facility] in [place] - spoke with pharmacist Ernestine Mcmurray to dc the fexofenadine (was not on med list, so unable to remove) Saline spray sent to requested pharmacy electronically Nothing further needed; will sign off

## 2015-04-16 ENCOUNTER — Encounter: Payer: Commercial Managed Care - HMO | Admitting: Internal Medicine

## 2015-04-16 ENCOUNTER — Telehealth: Payer: Self-pay | Admitting: Internal Medicine

## 2015-04-16 ENCOUNTER — Other Ambulatory Visit: Payer: Self-pay | Admitting: Internal Medicine

## 2015-04-16 NOTE — Telephone Encounter (Signed)
Called spoke with patient who is inquiring about her cough syrup rx from the 1.6.17 phone encounter Advised pt that her rx was placed in the mail at about 4:30pm on 1.6.17 - the mail goes out at 3:30, so it likely was not mailed until yesterday Pt okay with this and voiced her understanding  She did ask for a self-administration order for her Proair faxed to [phone] Dr Annamaria Boots, is this okay to do?  Thank you.

## 2015-04-16 NOTE — Telephone Encounter (Signed)
Ok to self-administer albuterol inhaler every 4-6 hours as needed

## 2015-04-16 NOTE — Telephone Encounter (Signed)
rx faxed to below verified fax #.  Nothing further needed.

## 2015-04-18 LAB — HM DIABETES EYE EXAM

## 2015-04-19 ENCOUNTER — Telehealth: Payer: Self-pay | Admitting: Internal Medicine

## 2015-04-19 NOTE — Telephone Encounter (Signed)
Per CY-patient can come in today to be seen by TP; held spot made for this time. Thanks.

## 2015-04-19 NOTE — Telephone Encounter (Signed)
Called spoke with pt. She went to ED at [facility] Pt was given ZPAK and prednisone. She reports to let Dr. Annamaria Boots know she is a "tough old lady and she will be okay" FYI for Dr. Annamaria Boots and I also made him aware.

## 2015-04-19 NOTE — Telephone Encounter (Signed)
Pt calling requesting an OV today - states that her cough and congested has not improved since last OV 03/25/15 Pt c/o wheezing, congested cough with clear mucus and SOB. States that she is doing everything as directed , taking all meds but is not getting any better.  Please advise Dr Annamaria Boots. Thanks.  Allergies  Allergen Reactions  . Invokana [Canagliflozin] Other (See Comments)    Yeast infectios  . Losartan     Angioedema   . Metformin And Related Diarrhea  . Latex Other (See Comments)    Other Reaction: redness, blisters  . Morphine And Related Other (See Comments)    Pt. States med makes her crazy  . Other Other (See Comments)    Other Reaction: redness, blisters  . Oxycodone-Acetaminophen Itching    TYLOX. Caused internal itching per patient      Medication List       This list is accurate as of: 04/19/15  9:25 AM.  Always use your most recent med list.               ACCU-CHEK AVIVA PLUS test strip  Generic drug:  glucose blood     albuterol (2.5 MG/3ML) 0.083% nebulizer solution  Commonly known as:  PROVENTIL  Use every 4 hours as needed.  Pt may keep this medicine in her room     PROAIR HFA 108 (90 Base) MCG/ACT inhaler  Generic drug:  albuterol  Inhale 2 puffs into the lungs every 6 (six) hours as needed for wheezing or shortness of breath.     aluminum hydroxide-magnesium carbonate 95-358 MG/15ML Susp  Commonly known as:  GAVISCON  2 tablespoons every 6 hours for indigestion     ASPIRIN LOW DOSE 81 MG EC tablet  Generic drug:  aspirin  TAKE 1 TABLET BY MOUTH ONCE DAILY.     bisacodyl 10 MG suppository  Commonly known as:  DULCOLAX  Place 1 suppository (10 mg total) rectally as needed for moderate constipation.     BREO ELLIPTA 100-25 MCG/INH Aepb  Generic drug:  Fluticasone Furoate-Vilanterol  INHALE 1 PUFF INTO LUNGS ONCE DAILY.     FISH OIL PO  Take 1 capsule by mouth daily.     Omega 3 1000 MG Caps  TAKE (1) CAPSULE BY MOUTH ONCE DAILY.     gabapentin 300 MG capsule  Commonly known as:  NEURONTIN  Take 300-600 mg by mouth 2 (two) times daily. 1 capsule in the morning and 2 at bedtime     guaiFENesin 100 MG/5ML liquid  Commonly known as:  ROBITUSSIN  Take 200 mg by mouth 3 (three) times daily as needed for cough.     HYDROcodone-acetaminophen 10-325 MG tablet  Commonly known as:  NORCO  Take 1 tablet by mouth every 6 (six) hours as needed for moderate pain.     insulin lispro 100 UNIT/ML injection  Commonly known as:  HUMALOG  Inject 5 Units into the skin 3 (three) times daily before meals.     INVOKANA 300 MG Tabs tablet  Generic drug:  canagliflozin  Take 300 mg by mouth daily before breakfast.     ipratropium-albuterol 0.5-2.5 (3) MG/3ML Soln  Commonly known as:  DUONEB  Take 3 mLs by nebulization 4 (four) times daily. Dx code J45.50     levothyroxine 75 MCG tablet  Commonly known as:  SYNTHROID, LEVOTHROID  TAKE (1) TABLET BY MOUTH ONCE DAILY BEFORE BREAKFAST.     lidocaine 5 %  Commonly known as:  LIDODERM  Place 1 patch onto the skin daily. Remove & Discard patch within 12 hours or as directed by MD     loratadine 10 MG tablet  Commonly known as:  CLARITIN  Take 1 tablet (10 mg total) by mouth daily as needed for allergies.     metoprolol succinate 50 MG 24 hr tablet  Commonly known as:  TOPROL-XL  TAKE 1 TABLET BY MOUTH ONCE DAILY.     multivitamins ther. w/minerals Tabs tablet  TAKE ONE TABLET BY MOUTH ONCE DAILY.     nystatin 100000 UNIT/ML suspension  Commonly known as:  MYCOSTATIN  TAKE 5 MLS BY MOUTH 4 TIMES DAILY AS NEEDED.     primidone 50 MG tablet  Commonly known as:  MYSOLINE  Take 2 tablets (100 mg total) by mouth 2 (two) times daily.     promethazine-codeine 6.25-10 MG/5ML syrup  Commonly known as:  PHENERGAN with CODEINE  Take 5 mLs by mouth every 6 (six) hours as needed for cough.     ranitidine 150 MG capsule  Commonly known as:  ZANTAC  Take 150 mg by mouth 2 (two) times  daily.     sodium chloride 0.65 % Soln nasal spray  Commonly known as:  OCEAN  Place 1 spray into both nostrils as needed for congestion.     sucralfate 1 g tablet  Commonly known as:  CARAFATE  Take 1 tablet (1 g total) by mouth 4 (four) times daily -  before meals and at bedtime. Pt is to take an hour before meals     tiZANidine 4 MG tablet  Commonly known as:  ZANAFLEX  TAKE 1 TABLET BY MOUTH AT BEDTIME AS NEEDED FOR MUSCLE SPASMS.     TOUJEO SOLOSTAR 300 UNIT/ML Sopn  Generic drug:  Insulin Glargine  Inject 50 Units into the skin daily.     triamterene-hydrochlorothiazide 37.5-25 MG tablet  Commonly known as:  MAXZIDE-25  TAKE 1 TABLET BY MOUTH ONCE DAILY.     Vilazodone HCl 40 MG Tabs  Commonly known as:  VIIBRYD  Take 40 mg by mouth daily.

## 2015-04-19 NOTE — Telephone Encounter (Signed)
Called pt to schedule appt, was informed by an unnamed woman that she had left approx 3 minutes ago to head to the ED for these symptoms.  Nothing further needed at this time.

## 2015-04-22 ENCOUNTER — Ambulatory Visit (INDEPENDENT_AMBULATORY_CARE_PROVIDER_SITE_OTHER): Payer: Commercial Managed Care - HMO | Admitting: Internal Medicine

## 2015-04-22 ENCOUNTER — Encounter: Payer: Self-pay | Admitting: Internal Medicine

## 2015-04-22 ENCOUNTER — Telehealth: Payer: Self-pay | Admitting: *Deleted

## 2015-04-22 VITALS — BP 130/80 | HR 90 | Temp 98.0°F | Resp 16 | Ht 64.0 in | Wt 169.0 lb

## 2015-04-22 DIAGNOSIS — E038 Other specified hypothyroidism: Secondary | ICD-10-CM | POA: Diagnosis not present

## 2015-04-22 DIAGNOSIS — Z794 Long term (current) use of insulin: Secondary | ICD-10-CM | POA: Diagnosis not present

## 2015-04-22 DIAGNOSIS — I10 Essential (primary) hypertension: Secondary | ICD-10-CM | POA: Diagnosis not present

## 2015-04-22 DIAGNOSIS — E118 Type 2 diabetes mellitus with unspecified complications: Secondary | ICD-10-CM | POA: Diagnosis not present

## 2015-04-22 NOTE — Telephone Encounter (Addendum)
Pt called states she takes [facility], and they require a call 6 weeks prior to an appt, unless contacted by the doctor's office calls pt states she has an appt 04/25/2015 at 1045am.  Pt request the transport pick her up at [facility], [address], [place], [place].  Pt gave [facility]'s line [phone] and hit the option for [place].  I called [phone], the on-call service stated, I would need to contact their appt line [phone], the appt for a transport were not in the office today due to the holiday and requested a call back 04/23/2015 to schedule.  I informed pt I would call again tomorrow.  04/23/2015 - spoke with Stanton Kidney at [facility], she states pt is scheduled to be picked up for appt with [facility] 04/25/2015 at 1045am.

## 2015-04-22 NOTE — Progress Notes (Signed)
Subjective:  Patient ID: [name], female    DOB: 03-02-1951  Age: 65 y.o. MRN: [id number]  CC: Diabetes   HPI [name] presents for f/up - she offers no new complaints.  Outpatient Prescriptions Prior to Visit  Medication Sig Dispense Refill  . ACCU-CHEK AVIVA PLUS test strip     . albuterol (PROVENTIL) (2.5 MG/3ML) 0.083% nebulizer solution Use every 4 hours as needed.  Pt may keep this medicine in her room (Patient taking differently: Take 2.5 mg by nebulization every 4 (four) hours as needed for wheezing or shortness of breath. ) 30 vial 1  . aluminum hydroxide-magnesium carbonate (GAVISCON) 95-358 MG/15ML SUSP 2 tablespoons every 6 hours for indigestion 355 mL prn  . ASPIRIN LOW DOSE 81 MG EC tablet TAKE 1 TABLET BY MOUTH ONCE DAILY. 30 tablet 11  . bisacodyl (DULCOLAX) 10 MG suppository Place 1 suppository (10 mg total) rectally as needed for moderate constipation. 12 suppository 0  . BREO ELLIPTA 100-25 MCG/INH AEPB INHALE 1 PUFF INTO LUNGS ONCE DAILY. 30 each 11  . canagliflozin (INVOKANA) 300 MG TABS tablet Take 300 mg by mouth daily before breakfast.    . gabapentin (NEURONTIN) 300 MG capsule Take 300-600 mg by mouth 2 (two) times daily. 1 capsule in the morning and 2 at bedtime    . guaiFENesin (ROBITUSSIN) 100 MG/5ML liquid Take 200 mg by mouth 3 (three) times daily as needed for cough.    Marland Kitchen HYDROcodone-acetaminophen (NORCO) 10-325 MG tablet Take 1 tablet by mouth every 6 (six) hours as needed for moderate pain. 75 tablet 0  . Insulin Glargine (TOUJEO SOLOSTAR) 300 UNIT/ML SOPN Inject 50 Units into the skin daily.    . insulin lispro (HUMALOG) 100 UNIT/ML injection Inject 5 Units into the skin 3 (three) times daily before meals.    Marland Kitchen ipratropium-albuterol (DUONEB) 0.5-2.5 (3) MG/3ML SOLN Take 3 mLs by nebulization 4 (four) times daily. Dx code J45.50 150 mL 12  . levothyroxine (SYNTHROID, LEVOTHROID) 75 MCG tablet TAKE (1) TABLET BY MOUTH ONCE DAILY BEFORE BREAKFAST.  30 tablet 5  . lidocaine (LIDODERM) 5 % Place 1 patch onto the skin daily. Remove & Discard patch within 12 hours or as directed by MD 90 patch 3  . loratadine (CLARITIN) 10 MG tablet Take 1 tablet (10 mg total) by mouth daily as needed for allergies. 30 tablet prn  . metoprolol succinate (TOPROL-XL) 50 MG 24 hr tablet TAKE 1 TABLET BY MOUTH ONCE DAILY. 30 tablet 11  . Multiple Vitamins-Minerals (MULTIVITAMINS THER. W/MINERALS) TABS tablet TAKE ONE TABLET BY MOUTH ONCE DAILY. 30 each 11  . nystatin (MYCOSTATIN) 100000 UNIT/ML suspension TAKE 5 MLS BY MOUTH 4 TIMES DAILY AS NEEDED. 60 mL 11  . Omega 3 1000 MG CAPS TAKE (1) CAPSULE BY MOUTH ONCE DAILY. 30 capsule 11  . Omega-3 Fatty Acids (FISH OIL PO) Take 1 capsule by mouth daily.    . primidone (MYSOLINE) 50 MG tablet Take 2 tablets (100 mg total) by mouth 2 (two) times daily. (Patient taking differently: Take 150 mg by mouth 2 (two) times daily. ) 360 tablet 1  . PROAIR HFA 108 (90 Base) MCG/ACT inhaler Inhale 2 puffs into the lungs every 6 (six) hours as needed for wheezing or shortness of breath. 1 Inhaler 5  . ranitidine (ZANTAC) 150 MG capsule Take 150 mg by mouth 2 (two) times daily.    . sodium chloride (OCEAN) 0.65 % SOLN nasal spray Place 1 spray into both nostrils as  needed for congestion. 50 mL 5  . sucralfate (CARAFATE) 1 g tablet Take 1 tablet (1 g total) by mouth 4 (four) times daily -  before meals and at bedtime. Pt is to take an hour before meals 120 tablet 1  . tiZANidine (ZANAFLEX) 4 MG tablet TAKE 1 TABLET BY MOUTH AT BEDTIME AS NEEDED FOR MUSCLE SPASMS. 30 tablet 11  . triamterene-hydrochlorothiazide (MAXZIDE-25) 37.5-25 MG tablet TAKE 1 TABLET BY MOUTH ONCE DAILY. 30 tablet 11  . Vilazodone HCl (VIIBRYD) 40 MG TABS Take 40 mg by mouth daily.    . promethazine-codeine (PHENERGAN WITH CODEINE) 6.25-10 MG/5ML syrup Take 5 mLs by mouth every 6 (six) hours as needed for cough. 200 mL 0   No facility-administered medications prior  to visit.    ROS Review of Systems  Constitutional: Positive for fatigue. Negative for fever, chills, diaphoresis and appetite change.  HENT: Negative.   Eyes: Negative.   Respiratory: Negative.  Negative for cough, choking, chest tightness, shortness of breath and stridor.   Cardiovascular: Negative.  Negative for chest pain, palpitations and leg swelling.  Gastrointestinal: Negative.  Negative for nausea, vomiting, abdominal pain, diarrhea, constipation and blood in stool.  Endocrine: Negative.   Genitourinary: Negative.  Negative for difficulty urinating.  Musculoskeletal: Negative.  Negative for myalgias, back pain, joint swelling and arthralgias.  Skin: Negative.  Negative for color change and rash.  Allergic/Immunologic: Negative.   Neurological: Positive for tremors. Negative for dizziness and light-headedness.  Hematological: Negative.  Negative for adenopathy. Does not bruise/bleed easily.  Psychiatric/Behavioral: Negative.     Objective:  BP 130/80 mmHg  Pulse 90  Temp(Src) 98 F (36.7 C) (Oral)  Resp 16  Ht 5\' 4"  (1.626 m)  Wt 169 lb (76.658 kg)  BMI 28.99 kg/m2  SpO2 97%  BP Readings from Last 3 Encounters:  04/22/15 130/80  04/09/15 118/70  04/08/15 117/68    Wt Readings from Last 3 Encounters:  04/22/15 169 lb (76.658 kg)  04/09/15 173 lb (78.472 kg)  04/08/15 179 lb (81.194 kg)    Physical Exam  Constitutional: She is oriented to person, place, and time. No distress.  HENT:  Mouth/Throat: Oropharynx is clear and moist. No oropharyngeal exudate.  Eyes: Conjunctivae are normal. Right eye exhibits no discharge. Left eye exhibits no discharge. No scleral icterus.  Neck: Normal range of motion. Neck supple. No JVD present. No tracheal deviation present. No thyromegaly present.  Cardiovascular: Normal rate, regular rhythm, normal heart sounds and intact distal pulses.  Exam reveals no gallop and no friction rub.   No murmur heard. Pulmonary/Chest: Effort  normal and breath sounds normal. No stridor. No respiratory distress. She has no wheezes. She has no rales. She exhibits no tenderness.  Abdominal: Soft. Bowel sounds are normal. She exhibits no distension and no mass. There is no tenderness. There is no rebound and no guarding.  Musculoskeletal: Normal range of motion. She exhibits no edema or tenderness.  Lymphadenopathy:    She has no cervical adenopathy.  Neurological: She is oriented to person, place, and time.  Skin: Skin is warm and dry. No rash noted. She is not diaphoretic. No erythema. No pallor.  Vitals reviewed.   Lab Results  Component Value Date   WBC 6.5 04/08/2015   HGB 12.9 04/08/2015   HCT 38.2 04/08/2015   PLT 162 04/08/2015   GLUCOSE 165* 04/08/2015   CHOL 180 02/13/2015   TRIG 175.0* 02/13/2015   HDL 64.80 02/13/2015   LDLCALC 80 02/13/2015  ALT 41 04/08/2015   AST 31 04/08/2015   NA 136 04/08/2015   K 3.6 04/08/2015   CL 99* 04/08/2015   CREATININE 0.64 04/08/2015   BUN 12 04/08/2015   CO2 29 04/08/2015   TSH 1.18 02/13/2015   INR 1.05 04/08/2015   HGBA1C 7.0* 02/13/2015   MICROALBUR 1.7 02/13/2015    Dg Chest 2 View  04/08/2015  CLINICAL DATA:  Chest pain since this morning. EXAM: CHEST  2 VIEW COMPARISON:  03/21/2015 FINDINGS: The heart size and mediastinal contours are within normal limits. Both lungs are clear. The visualized skeletal structures are unremarkable. Bilateral battery packs for stimulator device noted. Telemetry leads overlie the chest. IMPRESSION: No active cardiopulmonary disease. Electronically Signed   By: Misty Stanley M.D.   On: 04/08/2015 11:43    Assessment & Plan:   [name] was seen today for diabetes.  Diagnoses and all orders for this visit:  Essential hypertension- her BP is well controlled  Other specified hypothyroidism- her Tsh is in the normal range, will cont the current synthroid dose  Type 2 diabetes mellitus with complication, with long-term current use of  insulin ([name])- her blood sugars are well controlled  I have discontinued [name] promethazine-codeine. I am also having her maintain her ASPIRIN LOW DOSE, metoprolol succinate, triamterene-hydrochlorothiazide, levothyroxine, bisacodyl, primidone, tiZANidine, lidocaine, albuterol, aluminum hydroxide-magnesium carbonate, ipratropium-albuterol, nystatin, gabapentin, insulin lispro, canagliflozin, guaiFENesin, Insulin Glargine, Omega-3 Fatty Acids (FISH OIL PO), ranitidine, Vilazodone HCl, sucralfate, ACCU-CHEK AVIVA PLUS, HYDROcodone-acetaminophen, loratadine, PROAIR HFA, BREO ELLIPTA, sodium chloride, multivitamins ther. w/minerals, and Omega 3.  No orders of the defined types were placed in this encounter.     Follow-up: Return in about 4 months (around 08/20/2015).  Scarlette Calico, MD

## 2015-04-22 NOTE — Progress Notes (Signed)
Pre visit review using our clinic review tool, if applicable. No additional management support is needed unless otherwise documented below in the visit note. 

## 2015-04-22 NOTE — Patient Instructions (Signed)

## 2015-04-23 ENCOUNTER — Encounter: Payer: Self-pay | Admitting: Internal Medicine

## 2015-04-25 ENCOUNTER — Ambulatory Visit (INDEPENDENT_AMBULATORY_CARE_PROVIDER_SITE_OTHER): Payer: Commercial Managed Care - HMO

## 2015-04-25 ENCOUNTER — Encounter: Payer: Self-pay | Admitting: Podiatry

## 2015-04-25 ENCOUNTER — Ambulatory Visit (INDEPENDENT_AMBULATORY_CARE_PROVIDER_SITE_OTHER): Payer: Commercial Managed Care - HMO | Admitting: Podiatry

## 2015-04-25 VITALS — BP 122/78 | HR 90 | Resp 16

## 2015-04-25 DIAGNOSIS — B351 Tinea unguium: Secondary | ICD-10-CM | POA: Diagnosis not present

## 2015-04-25 DIAGNOSIS — E119 Type 2 diabetes mellitus without complications: Secondary | ICD-10-CM

## 2015-04-25 DIAGNOSIS — M79675 Pain in left toe(s): Secondary | ICD-10-CM

## 2015-04-25 DIAGNOSIS — M79604 Pain in right leg: Secondary | ICD-10-CM

## 2015-04-25 DIAGNOSIS — M79674 Pain in right toe(s): Secondary | ICD-10-CM | POA: Diagnosis not present

## 2015-04-25 DIAGNOSIS — M21371 Foot drop, right foot: Secondary | ICD-10-CM

## 2015-04-25 DIAGNOSIS — M79605 Pain in left leg: Secondary | ICD-10-CM

## 2015-04-25 NOTE — Progress Notes (Signed)
Subjective:     Patient ID: [name], female   DOB: [birth year], 65 y.o.   MRN: [id number]  HPI patient presents with pain in the outside of the left foot and bruising of the right big toe with possibility for fracture. Also presents with nail disease 1-5 both feet with long-term diabetes thick yellow brittle nails that she cannot cut   Review of Systems  All other systems reviewed and are negative.      Objective:   Physical Exam  Constitutional: She is oriented to person, place, and time.  Cardiovascular: Intact distal pulses.   Musculoskeletal: Normal range of motion.  Neurological: She is oriented to person, place, and time.  Skin: Skin is warm.  Nursing note and vitals reviewed.  neurovascular status intact muscle strength adequate range of motion within normal limits with patient noted to have discomfort in the outside left foot mild in intensity and also noted to have a bruised right big toe that was traumatized. Patient is noted to have thick yellow brittle nails 1-5 both feet with diminished DTR reflexes and sharp Dole vibratory and is also noted to have good digital perfusion and is well oriented 3     Assessment:     Traumatized right and left foot with tendinitis-like symptoms and mycotic nail infection    Plan:     H&P and x-rays reviewed of both feet. Today I render diabetic education advice and I debrided nailbeds 1-5 both feet with no iatrogenic bleeding and recommended continued care. If any changes were to occur patient's to let us know

## 2015-04-25 NOTE — Progress Notes (Signed)
   Subjective:    Patient ID: [name], female    DOB: [birth year], 65 y.o.   MRN: [id number]  HPI  Pt presents for nail debridement. She c/o left lateral foot pain with h/o fx. Right toe pain in 1st toe  Review of Systems  Musculoskeletal: Positive for back pain, arthralgias and gait problem.  All other systems reviewed and are negative.      Objective:   Physical Exam        Assessment & Plan:

## 2015-04-26 ENCOUNTER — Telehealth: Payer: Self-pay | Admitting: Internal Medicine

## 2015-04-26 NOTE — Telephone Encounter (Signed)
Left msg on triage stating needing a written order of the changes on pt insulin while she is on the prednisone. Called Desi back no answer LMOM can fax over msg will need a fax #...Johny Chess

## 2015-04-26 NOTE — Telephone Encounter (Signed)
Called pt back gave instructions to the caregiver...[name]

## 2015-04-26 NOTE — Telephone Encounter (Signed)
Not sure what she needs exactly? Can we clarify

## 2015-04-26 NOTE — Telephone Encounter (Signed)
Fine, please have her med list updated at next visit so it is current. Levemir to 60 units daily. 15 units humalog if sugars >250 pre-meal. If sugars >350 pre-meal given 20 units humalog. Call back on Monday with sugars.

## 2015-04-26 NOTE — Telephone Encounter (Signed)
See duplicate phone note Desi called back fax msg to # given...[name]

## 2015-04-26 NOTE — Telephone Encounter (Signed)
MD is out of office pls advise on call-a-nurse msg below...Johny Chess

## 2015-04-26 NOTE — Telephone Encounter (Signed)
Patient's blood sugar is 314 before breakfast.  Was given 10 units of humalog.  Would like to know if there is anything she needs to do.

## 2015-04-26 NOTE — Telephone Encounter (Signed)
Called pt to inform of md instructions. Pt states she is no longer taking Toujeo not sure why it's still on her list. Pt states she tales Huamlog 10 units three times a day, and at night she takes Levemir 50 units. Pls advise on what changes she need with these two insulins...Johny Chess

## 2015-04-26 NOTE — Telephone Encounter (Signed)
Would recommend increase in toujeo to 60 units at night time while on steroid. Recommend 10 units with meals if sugars >200 pre-meal and 15 units with meals if sugar >325 pre-meal. Call if sugars are not better Monday.

## 2015-04-26 NOTE — Telephone Encounter (Signed)
Faxed note to # given below...[name]

## 2015-04-26 NOTE — Telephone Encounter (Signed)
[facility] [place] [facility]  Patient Name: [name]  DOB: January 02, 1951    Initial Comment Caller states her BS is 314, before breakfast. She ate, and given 10 units Humlog. Now it's 325. No symptoms.   Nurse Assessment  Nurse: Wynetta Emery, RN, Baker Janus Date/Time Eilene Ghazi Time): 04/26/2015 9:17:38 AM  Confirm and document reason for call. If symptomatic, describe symptoms. You must click the next button to save text entered. ---Amarion is taking prednisone onset 1/19 at this time and has elevated blood sugar 318 this am after breakfast and insulin was 325 no s/sx  Has the patient traveled out of the country within the last 30 days? ---No  Does the patient have any new or worsening symptoms? ---Yes  Will a triage be completed? ---Yes  Related visit to physician within the last 2 weeks? ---No  Does the PT have any chronic conditions? (i.e. diabetes, asthma, etc.) ---Yes  List chronic conditions. ---Diabetes  Is this a behavioral health or substance abuse call? ---No     Guidelines    Guideline Title Affirmed Question Affirmed Notes  Diabetes - High Blood Sugar [1] Blood glucose > 300 mg/dl (16.5 mmol/l) AND [2] two or more times in a row    Final Disposition User   Call PCP Now Wynetta Emery, RN, Baker Janus    Comments  note; patient has bronchitis and is on prednisone she has had blood sugars this am of 318 and 325 attempted to give her appointment in the office but she refuses does not want to go wants to know what she needs to do while on prednisone -- (can rise the blood sugars) she is asymptomatic at this time. Can someone call and give her directives on if she needs to take extra insulin during the time on prednisone. Thanks. Baker Janus   Referrals  REFERRED TO PCP OFFICE   Disagree/Comply: Comply

## 2015-04-26 NOTE — Telephone Encounter (Signed)
[name] from [facility] called to give you a fax # [phone]

## 2015-04-29 NOTE — Telephone Encounter (Signed)
Called to speak with Dessie from [place], pt and Vevay had altercation on phone and phone hung. I was calling to see where the prednisone was given as it is not on our med list. If they are concerned with high surgar readings while being on steroid an alternative can be asked for whoever prescribed that medication. PCP is out of town

## 2015-04-29 NOTE — Telephone Encounter (Signed)
Dessie from [facility] called in to give pts blood sugar readings:  7:27am 250 1/23 8:09pm 247 1/22 5:13pm 157 1:00pm 197 7:43am 155 1/21 8:30pm 208 6:00pm 161 1:00pm 238 8:00am 208 1/20 8:18pm 180 5:46pm 223 1:04pm 258 11:24am 304

## 2015-04-29 NOTE — Telephone Encounter (Signed)
Im not sure what time she is taking her carafate but she wants to take it an hour earlier than she is now. She needs provider permission

## 2015-05-01 ENCOUNTER — Encounter: Payer: Self-pay | Admitting: Gastroenterology

## 2015-05-01 NOTE — Telephone Encounter (Signed)
Letter completed. I am unsure if this is what she needs or not. Let's just have it faxed to her facility.

## 2015-05-01 NOTE — Telephone Encounter (Signed)
Noted and will mail it to patient

## 2015-05-02 ENCOUNTER — Ambulatory Visit: Payer: Self-pay | Admitting: Internal Medicine

## 2015-05-03 ENCOUNTER — Telehealth: Payer: Self-pay | Admitting: Internal Medicine

## 2015-05-03 ENCOUNTER — Encounter: Payer: Self-pay | Admitting: Internal Medicine

## 2015-05-03 ENCOUNTER — Ambulatory Visit (INDEPENDENT_AMBULATORY_CARE_PROVIDER_SITE_OTHER): Payer: Commercial Managed Care - HMO | Admitting: Internal Medicine

## 2015-05-03 VITALS — BP 114/82 | HR 91 | Temp 97.8°F | Ht 64.5 in | Wt 170.6 lb

## 2015-05-03 DIAGNOSIS — K5900 Constipation, unspecified: Secondary | ICD-10-CM | POA: Diagnosis not present

## 2015-05-03 DIAGNOSIS — K649 Unspecified hemorrhoids: Secondary | ICD-10-CM | POA: Diagnosis not present

## 2015-05-03 DIAGNOSIS — K219 Gastro-esophageal reflux disease without esophagitis: Secondary | ICD-10-CM

## 2015-05-03 NOTE — Telephone Encounter (Signed)
Called spoke with pt. She refuses to call Dr. Gwynn Burly office. Reports they never listen to her and this is our problem. She reports if we don't take care of this then we will pay for ED visit she makes bc she has sinus issues. Please advise Dr. Annamaria Boots thanks

## 2015-05-03 NOTE — Telephone Encounter (Signed)
Pt calling back gave wrong fax # the correct one is [phone]

## 2015-05-03 NOTE — Telephone Encounter (Signed)
Can you please send requested indicating patient may take Benadryl 25 mg twice daily if needed for allergy/sinus problems.

## 2015-05-03 NOTE — Progress Notes (Signed)
GERD noted be much better on Protonix 40 mg once daily. Also takes Carafate before meals and at bedtime. Has MiraLax on hand which she uses daily to every other day for constipation.     [place] banding procedure note:  The patient presents with symptomatic internal hemorrhoids, unresponsive to maximal medical therapy, requesting rubber band ligation of her hemorrhoidal disease. All risks, benefits, and alternative forms of therapy were described and informed consent was obtained.  In the left lateral decubitus position, DRE revealed no abnormalities -  anoscopy performed which revealed a prominent right anterior column.  The decision was made to band the right anterior internal hemorrhoid; the [place] was used to perform band ligation(latex free) without complication. Digital anorectal examination was then performed to assure proper positioning of the band;  Band found to be barely on and it fell off with gentle digital manipulation. Subsequently, I obtained another latex free band;  It was deployed. Follow-up digital rectal exam within be in excellent position. No pinching or pain after deployment. The patient was discharged home without pain or other issues. Dietary and behavioral recommendations were given. Patient will return in 4 weeks for followup and possible additional banding as required.  No complications were encountered and the patient tolerated the procedure well.  Patient is continue Protonix and Carafate daily. Use MiraLax daily as needed.

## 2015-05-03 NOTE — Telephone Encounter (Signed)
Patient says that she has moved to [facility] [address] [place], [place] [id number]; [phone], Attn: Charma Igo Needs letter sent to new home saying that patient can take Benadryl for her sinuses.  Patient says that she would like it sent today because she is having problems with her sinuses and they will not give her the medication without the doctor approving it.    Allergies  Allergen Reactions  . Invokana [Canagliflozin] Other (See Comments)    Yeast infectios  . Losartan     Angioedema   . Metformin And Related Diarrhea  . Latex Other (See Comments)    Other Reaction: redness, blisters  . Morphine And Related Other (See Comments)    Pt. States med makes her crazy  . Other Other (See Comments)    Other Reaction: redness, blisters  . Oxycodone-Acetaminophen Itching    TYLOX. Caused internal itching per patient    Current Outpatient Prescriptions on File Prior to Visit  Medication Sig Dispense Refill  . ACCU-CHEK AVIVA PLUS test strip     . albuterol (PROVENTIL) (2.5 MG/3ML) 0.083% nebulizer solution Use every 4 hours as needed.  Pt may keep this medicine in her room (Patient taking differently: Take 2.5 mg by nebulization every 4 (four) hours as needed for wheezing or shortness of breath. ) 30 vial 1  . aluminum hydroxide-magnesium carbonate (GAVISCON) 95-358 MG/15ML SUSP 2 tablespoons every 6 hours for indigestion 355 mL prn  . ASPIRIN LOW DOSE 81 MG EC tablet TAKE 1 TABLET BY MOUTH ONCE DAILY. 30 tablet 11  . bisacodyl (DULCOLAX) 10 MG suppository Place 1 suppository (10 mg total) rectally as needed for moderate constipation. 12 suppository 0  . BREO ELLIPTA 100-25 MCG/INH AEPB INHALE 1 PUFF INTO LUNGS ONCE DAILY. 30 each 11  . canagliflozin (INVOKANA) 300 MG TABS tablet Take 300 mg by mouth daily before breakfast.    . gabapentin (NEURONTIN) 300 MG capsule Take 300-600 mg by mouth 2 (two) times daily. 1 capsule in the morning and 2 at bedtime    . guaiFENesin  (ROBITUSSIN) 100 MG/5ML liquid Take 200 mg by mouth 3 (three) times daily as needed for cough.    Marland Kitchen HYDROcodone-acetaminophen (NORCO) 10-325 MG tablet Take 1 tablet by mouth every 6 (six) hours as needed for moderate pain. 75 tablet 0  . insulin lispro (HUMALOG) 100 UNIT/ML injection Inject 5 Units into the skin 3 (three) times daily before meals.    Marland Kitchen ipratropium-albuterol (DUONEB) 0.5-2.5 (3) MG/3ML SOLN Take 3 mLs by nebulization 4 (four) times daily. Dx code J45.50 150 mL 12  . levothyroxine (SYNTHROID, LEVOTHROID) 75 MCG tablet TAKE (1) TABLET BY MOUTH ONCE DAILY BEFORE BREAKFAST. 30 tablet 5  . lidocaine (LIDODERM) 5 % Place 1 patch onto the skin daily. Remove & Discard patch within 12 hours or as directed by MD 90 patch 3  . loratadine (CLARITIN) 10 MG tablet Take 1 tablet (10 mg total) by mouth daily as needed for allergies. 30 tablet prn  . metoprolol succinate (TOPROL-XL) 50 MG 24 hr tablet TAKE 1 TABLET BY MOUTH ONCE DAILY. 30 tablet 11  . Multiple Vitamins-Minerals (MULTIVITAMINS THER. W/MINERALS) TABS tablet TAKE ONE TABLET BY MOUTH ONCE DAILY. 30 each 11  . nystatin (MYCOSTATIN) 100000 UNIT/ML suspension TAKE 5 MLS BY MOUTH 4 TIMES DAILY AS NEEDED. 60 mL 11  . Omega 3 1000 MG CAPS TAKE (1) CAPSULE BY MOUTH ONCE DAILY. 30 capsule 11  . Omega-3 Fatty Acids (FISH OIL  PO) Take 1 capsule by mouth daily.    . primidone (MYSOLINE) 50 MG tablet Take 2 tablets (100 mg total) by mouth 2 (two) times daily. (Patient taking differently: Take 150 mg by mouth 2 (two) times daily. ) 360 tablet 1  . PROAIR HFA 108 (90 Base) MCG/ACT inhaler Inhale 2 puffs into the lungs every 6 (six) hours as needed for wheezing or shortness of breath. 1 Inhaler 5  . ranitidine (ZANTAC) 150 MG capsule Take 150 mg by mouth 2 (two) times daily.    . sodium chloride (OCEAN) 0.65 % SOLN nasal spray Place 1 spray into both nostrils as needed for congestion. 50 mL 5  . sucralfate (CARAFATE) 1 g tablet Take 1 tablet (1 g total)  by mouth 4 (four) times daily -  before meals and at bedtime. Pt is to take an hour before meals 120 tablet 1  . tiZANidine (ZANAFLEX) 4 MG tablet TAKE 1 TABLET BY MOUTH AT BEDTIME AS NEEDED FOR MUSCLE SPASMS. 30 tablet 11  . triamterene-hydrochlorothiazide (MAXZIDE-25) 37.5-25 MG tablet TAKE 1 TABLET BY MOUTH ONCE DAILY. 30 tablet 11  . Vilazodone HCl (VIIBRYD) 40 MG TABS Take 40 mg by mouth daily.     No current facility-administered medications on file prior to visit.

## 2015-05-03 NOTE — Patient Instructions (Signed)
Avoid straining.  Benefiber 1 tablespoon twice daily  Limit toilet time to 2-5 minutes  Continue Protonix 40 mg daily  Continue Carafate-take 30 minutes before meals and at bedtime as needed  Take MiraLax 17 g orally daily as needed for constipation  Call with any interim problems  Schedule followup appointment in 4 weeks from now

## 2015-05-03 NOTE — Telephone Encounter (Signed)
She needs to get a list of her medications from her primary provider. This list needs to indicate which medications she is able to self-medicate. This is to avoid the multiple medication letters and confusion related to duplicate orders from multiple providers that caused confusion at her last facility.

## 2015-05-03 NOTE — Telephone Encounter (Signed)
Letter created and faxed to [facility] to the attn of Charma Igo and Scientist, physiological.  Faxed to [phone].   Called and advised Charma Igo as well as patient that letter has been sent. Nothing further needed.

## 2015-05-06 ENCOUNTER — Telehealth: Payer: Self-pay | Admitting: Internal Medicine

## 2015-05-06 ENCOUNTER — Encounter: Payer: Self-pay | Admitting: *Deleted

## 2015-05-06 NOTE — Telephone Encounter (Signed)
Please advise Dr Annamaria Boots if you are okay with [place] writing another letter regarding patient keeping her medications with her in her room. Pt states that she is having another asthma attack today and needs her meds. Most recent letter written 03/19/15. Patient states that she is in a new Facility and she needs the letter because they are not allowing her to have her medications with her. Most recent letter is below.Thanks.   Dear Ms. Santanna,  You are under our care for asthma/COPD and rhinitis. I recommend he be allowed to keep your PrAir HFA albuterol rescue inhaler on your person/in your room available when needed to use 2 puffs every 4 hours if needed as a rescue inhaler. Recommend you try fexofenadine 60 mg each morning and Benadryl 25 mg each evening. Recommend use nasal saline in room for use as needed. Recommend you keep your Flutter device in your room for use as directed to help mobilize secretions.     Medication List       This list is accurate as of: 05/06/15  9:12 AM.  Always use your most recent med list.               ACCU-CHEK AVIVA PLUS test strip  Generic drug:  glucose blood     albuterol (2.5 MG/3ML) 0.083% nebulizer solution  Commonly known as:  PROVENTIL  Use every 4 hours as needed.  Pt may keep this medicine in her room     PROAIR HFA 108 (90 Base) MCG/ACT inhaler  Generic drug:  albuterol  Inhale 2 puffs into the lungs every 6 (six) hours as needed for wheezing or shortness of breath.     aluminum hydroxide-magnesium carbonate 95-358 MG/15ML Susp  Commonly known as:  GAVISCON  2 tablespoons every 6 hours for indigestion     ASPIRIN LOW DOSE 81 MG EC tablet  Generic drug:  aspirin  TAKE 1 TABLET BY MOUTH ONCE DAILY.     bisacodyl 10 MG suppository  Commonly known as:  DULCOLAX  Place 1 suppository (10 mg total) rectally as needed for moderate constipation.     BREO ELLIPTA 100-25 MCG/INH Aepb  Generic drug:  Fluticasone Furoate-Vilanterol  INHALE 1 PUFF  INTO LUNGS ONCE DAILY.     FISH OIL PO  Take 1 capsule by mouth daily.     Omega 3 1000 MG Caps  TAKE (1) CAPSULE BY MOUTH ONCE DAILY.     gabapentin 300 MG capsule  Commonly known as:  NEURONTIN  Take 300-600 mg by mouth 2 (two) times daily. 1 capsule in the morning and 2 at bedtime     guaiFENesin 100 MG/5ML liquid  Commonly known as:  ROBITUSSIN  Take 200 mg by mouth 3 (three) times daily as needed for cough.     HYDROcodone-acetaminophen 10-325 MG tablet  Commonly known as:  NORCO  Take 1 tablet by mouth every 6 (six) hours as needed for moderate pain.     insulin lispro 100 UNIT/ML injection  Commonly known as:  HUMALOG  Inject 5 Units into the skin 3 (three) times daily before meals.     INVOKANA 300 MG Tabs tablet  Generic drug:  canagliflozin  Take 300 mg by mouth daily before breakfast.     ipratropium-albuterol 0.5-2.5 (3) MG/3ML Soln  Commonly known as:  DUONEB  Take 3 mLs by nebulization 4 (four) times daily. Dx code J45.50     levothyroxine 75 MCG tablet  Commonly known as:  SYNTHROID, LEVOTHROID  TAKE (1) TABLET BY MOUTH ONCE DAILY BEFORE BREAKFAST.     lidocaine 5 %  Commonly known as:  LIDODERM  Place 1 patch onto the skin daily. Remove & Discard patch within 12 hours or as directed by MD     loratadine 10 MG tablet  Commonly known as:  CLARITIN  Take 1 tablet (10 mg total) by mouth daily as needed for allergies.     metoprolol succinate 50 MG 24 hr tablet  Commonly known as:  TOPROL-XL  TAKE 1 TABLET BY MOUTH ONCE DAILY.     multivitamins ther. w/minerals Tabs tablet  TAKE ONE TABLET BY MOUTH ONCE DAILY.     nystatin 100000 UNIT/ML suspension  Commonly known as:  MYCOSTATIN  TAKE 5 MLS BY MOUTH 4 TIMES DAILY AS NEEDED.     primidone 50 MG tablet  Commonly known as:  MYSOLINE  Take 2 tablets (100 mg total) by mouth 2 (two) times daily.     ranitidine 150 MG capsule  Commonly known as:  ZANTAC  Take 150 mg by mouth 2 (two) times daily.      sodium chloride 0.65 % Soln nasal spray  Commonly known as:  OCEAN  Place 1 spray into both nostrils as needed for congestion.     sucralfate 1 g tablet  Commonly known as:  CARAFATE  Take 1 tablet (1 g total) by mouth 4 (four) times daily -  before meals and at bedtime. Pt is to take an hour before meals     tiZANidine 4 MG tablet  Commonly known as:  ZANAFLEX  TAKE 1 TABLET BY MOUTH AT BEDTIME AS NEEDED FOR MUSCLE SPASMS.     triamterene-hydrochlorothiazide 37.5-25 MG tablet  Commonly known as:  MAXZIDE-25  TAKE 1 TABLET BY MOUTH ONCE DAILY.     Vilazodone HCl 40 MG Tabs  Commonly known as:  VIIBRYD  Take 40 mg by mouth daily.       Allergies  Allergen Reactions  . Invokana [Canagliflozin] Other (See Comments)    Yeast infectios  . Losartan     Angioedema   . Metformin And Related Diarrhea  . Latex Other (See Comments)    Other Reaction: redness, blisters  . Morphine And Related Other (See Comments)    Pt. States med makes her crazy  . Other Other (See Comments)    Other Reaction: redness, blisters  . Oxycodone-Acetaminophen Itching    TYLOX. Caused internal itching per patient

## 2015-05-06 NOTE — Telephone Encounter (Signed)
Called and spoke with Manuela Schwartz at patient's living facility- unable to locate fax number, states that she will call back with this information.  Will hold in triage to ensure that the patient receives letter.

## 2015-05-06 NOTE — Telephone Encounter (Signed)
Left message for pt. Letter has been faxed. Nothing further was needed.

## 2015-05-06 NOTE — Telephone Encounter (Signed)
Ok to send note as requested. Thanks.

## 2015-05-06 NOTE — Telephone Encounter (Signed)
[phone], Luna Kitchens to provide fax number to [facility], fax number [phone]

## 2015-05-07 ENCOUNTER — Encounter: Payer: Self-pay | Admitting: Internal Medicine

## 2015-05-07 ENCOUNTER — Telehealth: Payer: Self-pay

## 2015-05-07 ENCOUNTER — Ambulatory Visit (INDEPENDENT_AMBULATORY_CARE_PROVIDER_SITE_OTHER): Payer: Commercial Managed Care - HMO | Admitting: Internal Medicine

## 2015-05-07 ENCOUNTER — Inpatient Hospital Stay: Payer: Self-pay | Admitting: Internal Medicine

## 2015-05-07 VITALS — BP 120/70 | HR 96 | Temp 98.5°F | Resp 16 | Ht 64.5 in | Wt 170.8 lb

## 2015-05-07 DIAGNOSIS — Z794 Long term (current) use of insulin: Secondary | ICD-10-CM

## 2015-05-07 DIAGNOSIS — E118 Type 2 diabetes mellitus with unspecified complications: Secondary | ICD-10-CM

## 2015-05-07 DIAGNOSIS — J4540 Moderate persistent asthma, uncomplicated: Secondary | ICD-10-CM | POA: Diagnosis not present

## 2015-05-07 MED ORDER — PREDNISONE 20 MG PO TABS
40.0000 mg | ORAL_TABLET | Freq: Every day | ORAL | Status: DC
Start: 2015-05-07 — End: 2015-05-08

## 2015-05-07 MED ORDER — GLUCERNA 1.0 CAL PO LIQD: 1.0000 | Freq: Two times a day (BID) | ORAL | Status: DC

## 2015-05-07 NOTE — Telephone Encounter (Signed)
Paperwork signed, faxed, copy sent to scan  *No charge per TJ*

## 2015-05-07 NOTE — Patient Instructions (Signed)
We have sent in the prescription for the prednisone that you can take after the 2 pills that are 50 mg. Take 2 pills daily of 20 mg pills for 5 days then take 1 pill daily for 4 days then stop.   We have sent in for the [place].   The sliding scale for your sugars:  Check sugar before meals, administer the sugar with meals. This scale will need to be adjusted after stopping prednisone  <150: 10 units humalog >150 and <250: 15 units humalog >250 and <400: 20 units humalog

## 2015-05-07 NOTE — Progress Notes (Signed)
Pre visit review using our clinic review tool, if applicable. No additional management support is needed unless otherwise documented below in the visit note. 

## 2015-05-07 NOTE — Telephone Encounter (Signed)
Personal care forms received and completed. Placed on MD's desk for signature

## 2015-05-08 ENCOUNTER — Telehealth: Payer: Self-pay | Admitting: Internal Medicine

## 2015-05-08 MED ORDER — GLUCERNA 1.0 CAL PO LIQD: 1.0000 | Freq: Two times a day (BID) | ORAL | Status: DC

## 2015-05-08 MED ORDER — PREDNISONE 20 MG PO TABS: 40.0000 mg | ORAL_TABLET | Freq: Every day | ORAL | Status: DC

## 2015-05-08 NOTE — Telephone Encounter (Signed)
[facility] called stating she received prescription requests through Mychart for predniSONE (DELTASONE) 20 MG tablet [id number] that Dr. Sharlet Salina prescribed yesterday and sliding scale insulin. They are unable to fill it through mychart. Can you please resend this medication. Not sure about the sliding scale insulin. I don't see it on her current med list.

## 2015-05-08 NOTE — Telephone Encounter (Signed)
Dr. Sharlet Salina only sent rx for prednisone & Glucerna drink. Fax both scripts back to [facility]...Johny Chess

## 2015-05-09 ENCOUNTER — Ambulatory Visit (INDEPENDENT_AMBULATORY_CARE_PROVIDER_SITE_OTHER): Payer: Commercial Managed Care - HMO | Admitting: Internal Medicine

## 2015-05-09 ENCOUNTER — Encounter: Payer: Self-pay | Admitting: Internal Medicine

## 2015-05-09 VITALS — BP 124/78 | HR 94 | Ht 64.5 in | Wt 170.2 lb

## 2015-05-09 DIAGNOSIS — J309 Allergic rhinitis, unspecified: Secondary | ICD-10-CM

## 2015-05-09 DIAGNOSIS — J302 Other seasonal allergic rhinitis: Secondary | ICD-10-CM

## 2015-05-09 DIAGNOSIS — K219 Gastro-esophageal reflux disease without esophagitis: Secondary | ICD-10-CM

## 2015-05-09 DIAGNOSIS — J4540 Moderate persistent asthma, uncomplicated: Secondary | ICD-10-CM | POA: Diagnosis not present

## 2015-05-09 DIAGNOSIS — J3089 Other allergic rhinitis: Principal | ICD-10-CM

## 2015-05-09 MED ORDER — DIPHENHYDRAMINE HCL 25 MG PO CAPS: ORAL_CAPSULE | ORAL | Status: DC

## 2015-05-09 NOTE — Assessment & Plan Note (Signed)
Mild recurrent. She is currently managing with Benadryl up to twice daily if needed. Claritin was no help.
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
## 2015-05-09 NOTE — Patient Instructions (Signed)
We went over medicines today. Good luck getting settled for the long term.

## 2015-05-09 NOTE — Assessment & Plan Note (Signed)
Reemphasized importance of reflux control, as her reflux interacts with her reactive airways disease

## 2015-05-09 NOTE — Assessment & Plan Note (Signed)
We recognize significant emotional overlay. Today she is quite clear and feeling well. I reviewed medications again with her. She does not need to be on prednisone at this time. She is capable of managing her own metered inhaler and nebulizer treatments when needed.

## 2015-05-10 ENCOUNTER — Encounter: Payer: Self-pay | Admitting: Neurology

## 2015-05-10 NOTE — Assessment & Plan Note (Signed)
Given another prescription for prednisone with taper for the next 10 days to help clear the wheezing. It is unclear to me if she always has this residual wheezing. Still complaining of SOB and wheezing so will extend. She will continue and finish levaquin. Continue breo, proair, duonebs at home as before.

## 2015-05-10 NOTE — Progress Notes (Signed)
   Subjective:    Patient ID: [name], female    DOB: 1951-03-14, 65 y.o.   MRN: [id number]  HPI The patient is a 66 YO female coming in for ER follow up (records not available for review but for COPD exacerbation/bronchitis, given levaquin and 2 days of steroids). She has been to the ER about 3-4 times this month. She is taking the levaquin now and is starting to feel better. Maybe 20% better right now. Still coughing a lot and some wheeze with walking. Denies fevers or chills. Some sinus drainage which she has all the time. Taking prednisone several times this month. This does cause her sugars to go up and she wants a sliding scale to use for her sugars.   Review of Systems  Constitutional: Positive for activity change and fatigue. Negative for fever, chills, appetite change and unexpected weight change.  HENT: Positive for congestion, postnasal drip and rhinorrhea. Negative for ear discharge, ear pain, sinus pressure, sore throat and trouble swallowing.   Eyes: Negative.   Respiratory: Positive for cough, shortness of breath and wheezing. Negative for apnea, choking and chest tightness.   Cardiovascular: Negative for chest pain, palpitations and leg swelling.  Gastrointestinal: Negative.   Endocrine:       High sugars  Musculoskeletal: Negative.       Objective:   Physical Exam  Constitutional: She is oriented to person, place, and time. She appears well-developed and well-nourished. No distress.  HENT:  Head: Normocephalic and atraumatic.  Right Ear: External ear normal.  Left Ear: External ear normal.  Oropharynx with redness and nose without crusting but with swelling of the turbinates.   Eyes: EOM are normal.  Neck: Normal range of motion.  Cardiovascular: Normal rate.   Pulmonary/Chest: Effort normal. No respiratory distress. She has wheezes. She has no rales. She exhibits no tenderness.  Diffuse expiratory wheeze which does not clear with coughing. Able to talk in  complete sentences.  Abdominal: Soft. She exhibits no distension. There is no tenderness.  Lymphadenopathy:    She has no cervical adenopathy.  Neurological: She is alert and oriented to person, place, and time.  Skin: Skin is warm and dry.   Filed Vitals:   05/07/15 1416  BP: 120/70  Pulse: 96  Temp: 98.5 F (36.9 C)  TempSrc: Oral  Resp: 16  Height: 5' 4.5" (1.638 m)  Weight: 170 lb 12.8 oz (77.474 kg)  SpO2: 97%      Assessment & Plan:

## 2015-05-10 NOTE — Assessment & Plan Note (Signed)
Sliding scale given to her based on her current insulin needs and her sugars at home with the steroids. After finishing steroids she will return to flat 10 units with meals and call back for more assistance if sugars are not controlled or start to go too low. She does have many allergies or intolerances to diabetic agents.

## 2015-05-13 ENCOUNTER — Ambulatory Visit: Payer: Commercial Managed Care - HMO | Admitting: *Deleted

## 2015-05-13 ENCOUNTER — Telehealth: Payer: Self-pay | Admitting: Internal Medicine

## 2015-05-13 DIAGNOSIS — M21371 Foot drop, right foot: Secondary | ICD-10-CM

## 2015-05-13 MED ORDER — FLUTICASONE FUROATE-VILANTEROL 100-25 MCG/INH IN AEPB: INHALATION_SPRAY | RESPIRATORY_TRACT | Status: DC

## 2015-05-13 NOTE — Telephone Encounter (Signed)
Spoke with pt. She needs a refill on Breo. This has been sent to preferred pharmacy. Nothing further was needed.

## 2015-05-13 NOTE — Progress Notes (Signed)
Patient ID: [name], female   DOB: 1950-10-24, 65 y.o.   MRN: [id number] Patient presents to be casted for a brace with [facility] Certified Pedorthist.  Patient will return in 4 weeks to be fitted.

## 2015-05-14 ENCOUNTER — Telehealth: Payer: Self-pay | Admitting: Internal Medicine

## 2015-05-14 NOTE — Telephone Encounter (Signed)
Spoke with Charma Igo at the facility per Pt request.  She reports that pt is having a hard time getting her rx for Hydrocodone from her pcp .  She is requesting an rx for Phenergan with Codeine from Dr Annamaria Boots to help with pain.  She reports that pt is not having any increase in cough or sob.  Please advise.  Allergies  Allergen Reactions  . Invokana [Canagliflozin] Other (See Comments)    Yeast infectios  . Losartan     Angioedema   . Metformin And Related Diarrhea  . Latex Other (See Comments)    Other Reaction: redness, blisters  . Other Other (See Comments)    Other Reaction: redness, blisters  . Oxycodone-Acetaminophen Itching    TYLOX. Caused internal itching per patient     Current Outpatient Prescriptions on File Prior to Visit  Medication Sig Dispense Refill  . ACCU-CHEK AVIVA PLUS test strip     . aluminum hydroxide-magnesium carbonate (GAVISCON) 95-358 MG/15ML SUSP 2 tablespoons every 6 hours for indigestion 355 mL prn  . ASPIRIN LOW DOSE 81 MG EC tablet TAKE 1 TABLET BY MOUTH ONCE DAILY. 30 tablet 11  . bisacodyl (DULCOLAX) 10 MG suppository Place 1 suppository (10 mg total) rectally as needed for moderate constipation. 12 suppository 0  . diphenhydrAMINE (BENADRYL) 25 mg capsule Take 25 mg by mouth 2 (two) times daily.    . diphenhydrAMINE (BENADRYL) 25 mg capsule 1 twice daily as needed- antihistamine 30 capsule prn  . Fluticasone Furoate-Vilanterol (BREO ELLIPTA) 100-25 MCG/INH AEPB INHALE 1 PUFF INTO LUNGS ONCE DAILY. 60 each 5  . gabapentin (NEURONTIN) 300 MG capsule Take 300-600 mg by mouth 2 (two) times daily. 1 capsule in the morning and 2 at bedtime    . guaiFENesin (ROBITUSSIN) 100 MG/5ML liquid Take 200 mg by mouth 3 (three) times daily as needed for cough. Reported on 05/07/2015    . HYDROcodone-acetaminophen (NORCO) 10-325 MG tablet Take 1 tablet by mouth every 6 (six) hours as needed for moderate pain. 75 tablet 0  . insulin lispro (HUMALOG) 100 UNIT/ML  injection Pt currently on sliding scale    . ipratropium-albuterol (DUONEB) 0.5-2.5 (3) MG/3ML SOLN Take 3 mLs by nebulization 4 (four) times daily. Dx code J45.50 150 mL 12  . levothyroxine (SYNTHROID, LEVOTHROID) 75 MCG tablet TAKE (1) TABLET BY MOUTH ONCE DAILY BEFORE BREAKFAST. 30 tablet 5  . lidocaine (LIDODERM) 5 % Place 1 patch onto the skin daily. Remove & Discard patch within 12 hours or as directed by MD 90 patch 3  . metoprolol succinate (TOPROL-XL) 50 MG 24 hr tablet TAKE 1 TABLET BY MOUTH ONCE DAILY. 30 tablet 11  . Multiple Vitamins-Minerals (MULTIVITAMINS THER. W/MINERALS) TABS tablet TAKE ONE TABLET BY MOUTH ONCE DAILY. 30 each 11  . Nutritional Supplements (GLUCERNA 1.0 CAL) LIQD Take 1 Can by mouth 2 (two) times daily. (Patient not taking: Reported on 05/09/2015) 60 Bottle 1  . nystatin (MYCOSTATIN) 100000 UNIT/ML suspension TAKE 5 MLS BY MOUTH 4 TIMES DAILY AS NEEDED. 60 mL 11  . nystatin cream (MYCOSTATIN) Apply 1 application topically 2 (two) times daily.    . Omega 3 1000 MG CAPS TAKE (1) CAPSULE BY MOUTH ONCE DAILY. 30 capsule 11  . Omega-3 Fatty Acids (FISH OIL PO) Take 1 capsule by mouth daily.    . primidone (MYSOLINE) 50 MG tablet Take 2 tablets (100 mg total) by mouth 2 (two) times daily. (Patient taking differently: Take 150 mg by mouth 2 (two) times  daily. ) 360 tablet 1  . PROAIR HFA 108 (90 Base) MCG/ACT inhaler Inhale 2 puffs into the lungs every 6 (six) hours as needed for wheezing or shortness of breath. 1 Inhaler 5  . ranitidine (ZANTAC) 150 MG capsule Take 150 mg by mouth 2 (two) times daily.    . sodium chloride (OCEAN) 0.65 % SOLN nasal spray Place 1 spray into both nostrils as needed for congestion. 50 mL 5  . sucralfate (CARAFATE) 1 g tablet Take 1 tablet (1 g total) by mouth 4 (four) times daily -  before meals and at bedtime. Pt is to take an hour before meals 120 tablet 1  . tiZANidine (ZANAFLEX) 4 MG tablet TAKE 1 TABLET BY MOUTH AT BEDTIME AS NEEDED FOR  MUSCLE SPASMS. 30 tablet 11  . triamterene-hydrochlorothiazide (MAXZIDE-25) 37.5-25 MG tablet TAKE 1 TABLET BY MOUTH ONCE DAILY. 30 tablet 11  . Vilazodone HCl (VIIBRYD) 40 MG TABS Take 40 mg by mouth daily.     No current facility-administered medications on file prior to visit.

## 2015-05-15 ENCOUNTER — Encounter: Payer: Self-pay | Admitting: Neurology

## 2015-05-15 ENCOUNTER — Ambulatory Visit (INDEPENDENT_AMBULATORY_CARE_PROVIDER_SITE_OTHER): Payer: Commercial Managed Care - HMO | Admitting: Neurology

## 2015-05-15 ENCOUNTER — Telehealth: Payer: Self-pay | Admitting: Internal Medicine

## 2015-05-15 VITALS — BP 110/62 | HR 84 | Ht 64.5 in | Wt 172.0 lb

## 2015-05-15 DIAGNOSIS — E1142 Type 2 diabetes mellitus with diabetic polyneuropathy: Secondary | ICD-10-CM

## 2015-05-15 DIAGNOSIS — G250 Essential tremor: Secondary | ICD-10-CM

## 2015-05-15 DIAGNOSIS — Z9689 Presence of other specified functional implants: Secondary | ICD-10-CM | POA: Diagnosis not present

## 2015-05-15 DIAGNOSIS — M5416 Radiculopathy, lumbar region: Secondary | ICD-10-CM | POA: Diagnosis not present

## 2015-05-15 MED ORDER — PEAK FLOW METER DEVI: Status: DC

## 2015-05-15 MED ORDER — PROMETHAZINE-CODEINE 6.25-10 MG/5ML PO SYRP: 5.0000 mL | ORAL_SOLUTION | Freq: Four times a day (QID) | ORAL | Status: DC | PRN

## 2015-05-15 NOTE — Telephone Encounter (Signed)
Ok to order peak flow meter for her asthma diagnosis  Ok prometh codeine 200 ml, 5 mo every 6 h as needed for cough

## 2015-05-15 NOTE — Telephone Encounter (Signed)
Patient called stating she needs the cough syrup because of the bug spray that they sprayed this am in her room and she is allergic to it.  She said there was a miscommunication and she wanted Dr. Annamaria Boots to know this is why she needs this. She also said do not talk to anyone else that answers the phone but her.  She asks that [place] call her at reached [phone].  She was crying and wants Mindy to know she is sorry for being irritable with her earlier.

## 2015-05-15 NOTE — Telephone Encounter (Signed)
Do we know what kind of pain she it treating? Perhaps better to accept the treatment her PCP feels is appropriate for her pain.

## 2015-05-15 NOTE — Telephone Encounter (Signed)
Called spoke with pt. She reports she lost her peak flow meter. She is wanting [place] to supply her with one and can come in on Tuesday to pick this up. Also she is requesting to pick up RX for the phenergan with codeine. As stated per previous phone message, pt wanted this bc she is having hard time getting her hydrocodone from her PCP. Pt reports this is correct but she is SOB and coughing and it is making her pain worse. She has still not been able to get her hydrocodone. Reports her SOB and cough is no worse than it has been She reports if we don't give this to her then she will find another pulmonologist's that will help her.  Please advise Dr. Annamaria Boots thanks
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
## 2015-05-15 NOTE — Telephone Encounter (Signed)
Spoke with Charma Igo When asked what type of pain pt is having she stated "she ain't in no pain"  Then she stated that the pt did c/o occ leg pain, but she is seeing her PCP today and will ask about tx options at this visit  Nothing further needed

## 2015-05-15 NOTE — Telephone Encounter (Signed)
Called and spoke with pt. Reviewed CY's recs. Pt stated that she would pick up the prometh codeine and peak flow meter on Tuesday when she comes to see Dr. Ronnald Ramp. Rx printed and signed. Rx  at the front of the office for pick up and peak flow meter rx has been printed at placed at the front of the office. Nothing further needed.

## 2015-05-15 NOTE — Procedures (Signed)
DBS Programming was performed.    Total time spent programming was 25 minutes.  Device was confirmed to be on.  Soft start was confirmed to be on.  Impedences were checked and were within normal limits.  Battery was checked and was determined to be functioning normally and not near the end of life.  Final settings were as follows:  Left brain electrode:     1-2+           ; Amplitude  2.6   V   ; Pulse width 90 microseconds;   Frequency   140   Hz.  Right brain electrode:     3-2+          ; Amplitude   3.4  V ;  Pulse width 90  microseconds;  Frequency   130    Hz.

## 2015-05-21 ENCOUNTER — Other Ambulatory Visit (INDEPENDENT_AMBULATORY_CARE_PROVIDER_SITE_OTHER): Payer: Commercial Managed Care - HMO

## 2015-05-21 ENCOUNTER — Encounter: Payer: Self-pay | Admitting: Internal Medicine

## 2015-05-21 ENCOUNTER — Telehealth: Payer: Self-pay

## 2015-05-21 ENCOUNTER — Ambulatory Visit (INDEPENDENT_AMBULATORY_CARE_PROVIDER_SITE_OTHER): Payer: Commercial Managed Care - HMO | Admitting: Internal Medicine

## 2015-05-21 VITALS — BP 124/88 | HR 105 | Temp 97.6°F | Resp 16 | Ht 64.5 in | Wt 169.0 lb

## 2015-05-21 DIAGNOSIS — M4806 Spinal stenosis, lumbar region: Secondary | ICD-10-CM | POA: Diagnosis not present

## 2015-05-21 DIAGNOSIS — E118 Type 2 diabetes mellitus with unspecified complications: Secondary | ICD-10-CM | POA: Diagnosis not present

## 2015-05-21 DIAGNOSIS — I10 Essential (primary) hypertension: Secondary | ICD-10-CM

## 2015-05-21 DIAGNOSIS — Z794 Long term (current) use of insulin: Secondary | ICD-10-CM

## 2015-05-21 DIAGNOSIS — E038 Other specified hypothyroidism: Secondary | ICD-10-CM

## 2015-05-21 DIAGNOSIS — M48061 Spinal stenosis, lumbar region without neurogenic claudication: Secondary | ICD-10-CM

## 2015-05-21 LAB — BASIC METABOLIC PANEL
BUN: 12 mg/dL (ref 6–23)
CO2: 30 mEq/L (ref 19–32)
Calcium: 9.6 mg/dL (ref 8.4–10.5)
Chloride: 98 mEq/L (ref 96–112)
Creatinine, Ser: 0.77 mg/dL (ref 0.40–1.20)
GFR: 79.58 mL/min (ref >60.00)
Glucose, Bld: 69 mg/dL — ABNORMAL LOW (ref 70–99)
Potassium: 4.4 mEq/L (ref 3.5–5.1)
Sodium: 133 mEq/L — ABNORMAL LOW (ref 135–145)

## 2015-05-21 LAB — TSH: TSH: 1.01 u[IU]/mL (ref 0.35–4.50)

## 2015-05-21 LAB — HEMOGLOBIN A1C: Hgb A1c MFr Bld: 6.8 % — ABNORMAL HIGH (ref 4.6–6.5)

## 2015-05-21 MED ORDER — HYDROCODONE-ACETAMINOPHEN 10-325 MG PO TABS: 1.0000 | ORAL_TABLET | Freq: Four times a day (QID) | ORAL | Status: DC | PRN

## 2015-05-21 NOTE — Patient Instructions (Signed)

## 2015-05-21 NOTE — Telephone Encounter (Signed)
New FL2 faxed to [facility] in [name] attn Cleon Dew with pt's demographics, insurance information, and current, medication list

## 2015-05-21 NOTE — Progress Notes (Signed)
Subjective:  Patient ID: [name], female    DOB: 11-01-1950  Age: 65 y.o. MRN: [id number]  CC: Diabetes   HPI [name] presents for a follow-up on diabetes. She tells me she has been getting excellent blood sugar control with her highest recently up to 188 on 1 day but most of her blood sugars are in the 70-127. She feels well and offers no new complaints.  Outpatient Prescriptions Prior to Visit  Medication Sig Dispense Refill  . ACCU-CHEK AVIVA PLUS test strip     . aluminum hydroxide-magnesium carbonate (GAVISCON) 95-358 MG/15ML SUSP 2 tablespoons every 6 hours for indigestion 355 mL prn  . ASPIRIN LOW DOSE 81 MG EC tablet TAKE 1 TABLET BY MOUTH ONCE DAILY. 30 tablet 11  . bisacodyl (DULCOLAX) 10 MG suppository Place 1 suppository (10 mg total) rectally as needed for moderate constipation. 12 suppository 0  . diphenhydrAMINE (BENADRYL) 25 mg capsule Take 25 mg by mouth 2 (two) times daily.    . Fluticasone Furoate-Vilanterol (BREO ELLIPTA) 100-25 MCG/INH AEPB INHALE 1 PUFF INTO LUNGS ONCE DAILY. 60 each 5  . gabapentin (NEURONTIN) 300 MG capsule Take 600 mg by mouth 2 (two) times daily.     . insulin lispro (HUMALOG) 100 UNIT/ML injection Pt currently on sliding scale    . ipratropium-albuterol (DUONEB) 0.5-2.5 (3) MG/3ML SOLN Take 3 mLs by nebulization 4 (four) times daily. Dx code J45.50 150 mL 12  . levothyroxine (SYNTHROID, LEVOTHROID) 75 MCG tablet TAKE (1) TABLET BY MOUTH ONCE DAILY BEFORE BREAKFAST. 30 tablet 5  . lidocaine (LIDODERM) 5 % Place 1 patch onto the skin daily. Remove & Discard patch within 12 hours or as directed by MD 90 patch 3  . metoprolol succinate (TOPROL-XL) 50 MG 24 hr tablet TAKE 1 TABLET BY MOUTH ONCE DAILY. 30 tablet 11  . Multiple Vitamins-Minerals (MULTIVITAMINS THER. W/MINERALS) TABS tablet TAKE ONE TABLET BY MOUTH ONCE DAILY. 30 each 11  . nystatin (MYCOSTATIN) 100000 UNIT/ML suspension TAKE 5 MLS BY MOUTH 4 TIMES DAILY AS  NEEDED. 60 mL 11  . nystatin cream (MYCOSTATIN) Apply 1 application topically 2 (two) times daily.    . Omega 3 1000 MG CAPS TAKE (1) CAPSULE BY MOUTH ONCE DAILY. 30 capsule 11  . Peak Flow Meter DEVI Use as directed 1 each 0  . primidone (MYSOLINE) 50 MG tablet Take 2 tablets (100 mg total) by mouth 2 (two) times daily. 360 tablet 1  . PROAIR HFA 108 (90 Base) MCG/ACT inhaler Inhale 2 puffs into the lungs every 6 (six) hours as needed for wheezing or shortness of breath. 1 Inhaler 5  . promethazine-codeine (PHENERGAN WITH CODEINE) 6.25-10 MG/5ML syrup Take 5 mLs by mouth every 6 (six) hours as needed for cough. 200 mL 0  . ranitidine (ZANTAC) 150 MG capsule Take 150 mg by mouth 2 (two) times daily.    . sodium chloride (OCEAN) 0.65 % SOLN nasal spray Place 1 spray into both nostrils as needed for congestion. 50 mL 5  . sucralfate (CARAFATE) 1 g tablet Take 1 tablet (1 g total) by mouth 4 (four) times daily -  before meals and at bedtime. Pt is to take an hour before meals 120 tablet 1  . tiZANidine (ZANAFLEX) 4 MG tablet TAKE 1 TABLET BY MOUTH AT BEDTIME AS NEEDED FOR MUSCLE SPASMS. 30 tablet 11  . triamterene-hydrochlorothiazide (MAXZIDE-25) 37.5-25 MG tablet TAKE 1 TABLET BY MOUTH ONCE DAILY. 30 tablet 11  . Vilazodone HCl (VIIBRYD)  40 MG TABS Take 40 mg by mouth daily.    Marland Kitchen HYDROcodone-acetaminophen (NORCO) 10-325 MG tablet Take 1 tablet by mouth every 6 (six) hours as needed for moderate pain. 75 tablet 0   No facility-administered medications prior to visit.    ROS Review of Systems  Constitutional: Negative.  Negative for appetite change, fatigue and unexpected weight change.  HENT: Negative.   Eyes: Negative.   Respiratory: Negative.  Negative for cough, choking, chest tightness, shortness of breath and stridor.   Cardiovascular: Negative.  Negative for chest pain, palpitations and leg swelling.  Gastrointestinal: Negative.  Negative for nausea, vomiting, abdominal pain, diarrhea and  constipation.  Endocrine: Negative.  Negative for polydipsia, polyphagia and polyuria.  Genitourinary: Negative.   Musculoskeletal: Positive for back pain. Negative for myalgias, joint swelling, arthralgias, gait problem and neck pain.  Skin: Negative.  Negative for color change.  Allergic/Immunologic: Negative.   Neurological: Negative.  Negative for dizziness.  Hematological: Negative.  Negative for adenopathy. Does not bruise/bleed easily.  Psychiatric/Behavioral: Negative.  Negative for sleep disturbance, dysphoric mood and decreased concentration. The patient is not nervous/anxious.     Objective:  BP 124/88 mmHg  Pulse 105  Temp(Src) 97.6 F (36.4 C) (Oral)  Ht 5' 4.5" (1.638 m)  Wt 169 lb (76.658 kg)  BMI 28.57 kg/m2  SpO2 95%  BP Readings from Last 3 Encounters:  05/21/15 124/88  05/15/15 110/62  05/09/15 124/78    Wt Readings from Last 3 Encounters:  05/21/15 169 lb (76.658 kg)  05/15/15 172 lb (78.019 kg)  05/09/15 170 lb 3.2 oz (77.202 kg)    Physical Exam  Constitutional: She is oriented to person, place, and time. No distress.  HENT:  Mouth/Throat: Oropharynx is clear and moist. No oropharyngeal exudate.  Eyes: Conjunctivae are normal. Right eye exhibits no discharge. Left eye exhibits no discharge. No scleral icterus.  Neck: Normal range of motion. Neck supple. No JVD present. No tracheal deviation present. No thyromegaly present.  Cardiovascular: Normal rate, regular rhythm, normal heart sounds and intact distal pulses.  Exam reveals no gallop and no friction rub.   No murmur heard. Pulmonary/Chest: Effort normal and breath sounds normal. No stridor. No respiratory distress. She has no wheezes. She has no rales. She exhibits no tenderness.  Abdominal: Soft. Bowel sounds are normal. She exhibits no distension and no mass. There is no tenderness. There is no rebound and no guarding.  Musculoskeletal: Normal range of motion. She exhibits no edema or tenderness.    Lymphadenopathy:    She has no cervical adenopathy.  Neurological: She is oriented to person, place, and time.  Skin: Skin is warm and dry. No rash noted. She is not diaphoretic. No erythema. No pallor.  Vitals reviewed.   Lab Results  Component Value Date   WBC 6.5 04/08/2015   HGB 12.9 04/08/2015   HCT 38.2 04/08/2015   PLT 162 04/08/2015   GLUCOSE 69* 05/21/2015   CHOL 180 02/13/2015   TRIG 175.0* 02/13/2015   HDL 64.80 02/13/2015   LDLCALC 80 02/13/2015   ALT 41 04/08/2015   AST 31 04/08/2015   NA 133* 05/21/2015   K 4.4 05/21/2015   CL 98 05/21/2015   CREATININE 0.77 05/21/2015   BUN 12 05/21/2015   CO2 30 05/21/2015   TSH 1.01 05/21/2015   INR 1.05 04/08/2015   HGBA1C 6.8* 05/21/2015   MICROALBUR 1.7 02/13/2015    Dg Chest 2 View  04/08/2015  CLINICAL DATA:  Chest pain since  this morning. EXAM: CHEST  2 VIEW COMPARISON:  03/21/2015 FINDINGS: The heart size and mediastinal contours are within normal limits. Both lungs are clear. The visualized skeletal structures are unremarkable. Bilateral battery packs for stimulator device noted. Telemetry leads overlie the chest. IMPRESSION: No active cardiopulmonary disease. Electronically Signed   By: Misty Stanley M.D.   On: 04/08/2015 11:43    Assessment & Plan:   Tanvi was seen today for diabetes.  Diagnoses and all orders for this visit:  Spinal stenosis of lumbar region- she is getting relief from her pain with Norco, I will continue -     HYDROcodone-acetaminophen (NORCO) 10-325 MG tablet; Take 1 tablet by mouth every 6 (six) hours as needed for moderate pain.  Essential hypertension- her blood pressure is well-controlled, electrolytes and renal function are stable. -     Basic metabolic panel; Future  Type 2 diabetes mellitus with complication, with long-term current use of insulin ([place])- her A1c shows that she has good blood sugar control. No changes are needed at this time. -     Basic metabolic panel;  Future -     Hemoglobin A1c; Future  Other specified hypothyroidism- her TSH is in the normal range, she will remain on the current dose of Synthroid. -     TSH; Future   I am having Ms. Galik maintain her ASPIRIN LOW DOSE, metoprolol succinate, triamterene-hydrochlorothiazide, levothyroxine, bisacodyl, primidone, tiZANidine, lidocaine, aluminum hydroxide-magnesium carbonate, ipratropium-albuterol, nystatin, gabapentin, insulin lispro, ranitidine, Vilazodone HCl, sucralfate, ACCU-CHEK AVIVA PLUS, PROAIR HFA, sodium chloride, multivitamins ther. w/minerals, Omega 3, diphenhydrAMINE, nystatin cream, fluticasone furoate-vilanterol, promethazine-codeine, Peak Flow Meter, HYDROcodone-acetaminophen, buPROPion, pantoprazole, potassium chloride SA, LEVEMIR FLEXTOUCH, TOVIAZ, lamoTRIgine, and trimethoprim.  Meds ordered this encounter  Medications  . HYDROcodone-acetaminophen (NORCO) 10-325 MG tablet    Sig: Take 1 tablet by mouth every 6 (six) hours as needed for moderate pain.    Dispense:  75 tablet    Refill:  0  . buPROPion (WELLBUTRIN XL) 150 MG 24 hr tablet    Sig:   . pantoprazole (PROTONIX) 20 MG tablet    Sig:   . DISCONTD: ranitidine (ZANTAC) 150 MG tablet    Sig:   . DISCONTD: gabapentin (NEURONTIN) 600 MG tablet    Sig:   . potassium chloride SA (K-DUR,KLOR-CON) 20 MEQ tablet    Sig:   . LEVEMIR FLEXTOUCH 100 UNIT/ML Pen    Sig:   . TOVIAZ 8 MG TB24 tablet    Sig:   . lamoTRIgine (LAMICTAL) 25 MG tablet    Sig:   . trimethoprim (TRIMPEX) 100 MG tablet    Sig:      Follow-up: Return in about 6 months (around 11/18/2015).  Scarlette Calico, MD

## 2015-05-21 NOTE — Progress Notes (Signed)
Pre visit review using our clinic review tool, if applicable. No additional management support is needed unless otherwise documented below in the visit note. 

## 2015-05-22 ENCOUNTER — Telehealth: Payer: Self-pay | Admitting: Internal Medicine

## 2015-05-22 NOTE — Telephone Encounter (Signed)
78 is a normal blood sugar. I do not recommend that she make any changes in her Levemir dose based off that number.

## 2015-05-22 NOTE — Telephone Encounter (Signed)
Patient called in. Advised of the below.   She also stated that she was told that the pharmacy didn't get the script (that [facility] faxed)  for norco. She states they told her they got one for phenegran cough syrup. Advised her that we have no record of issuing a script for cough syrup yesterday, and the norco was the script issued. She will follow up with the pharmacy  Additionally, confirmed to the patient that the [facility] was faxed per her request   Lastly,  The patient has gotten in a habit of calling in and asking for me. If i am not available she refuses to allow anyone else to help her and demands that she hold. This ties up additional staff and phones lines. i asked that she allow me to call her back to prevent this. She stated that she understood

## 2015-05-22 NOTE — Telephone Encounter (Signed)
Patient called to advise that her sugar dropped to 78 and she believes that she is on too much levimir. She states that we need to cut back on levimir. Please call patient at the # provided

## 2015-05-24 ENCOUNTER — Telehealth: Payer: Self-pay | Admitting: *Deleted

## 2015-05-24 NOTE — Telephone Encounter (Signed)
Tell her insurance denied prometh VC codeine cough syrup  Offer prometh codeine (not "VC")  200 ml,  5 ml every 6 hours if needed for cough. We can see if insurance would accept that

## 2015-05-24 NOTE — Telephone Encounter (Signed)
[employer] denied the Promethazine VC/Codeine. No alternatives given.

## 2015-05-24 NOTE — Telephone Encounter (Signed)
Called pt, states that she is now living at the [place].  Pt no longer using her local pharmacy, meds have to be called in to [phone]- [facility].  Called [facility] and spoke with pharmacist, states that pt's insurance does not cover any cough medicines, but prometh/codeine cough syrup costs about $13.  Med called in to pharmacy.  Nothing further needed.

## 2015-05-24 NOTE — Telephone Encounter (Signed)
Submitted PA for Promethazine VC/Codeine thru [facility]. Key: [id number] Pending outcome per CMM. Will await response.

## 2015-05-27 ENCOUNTER — Ambulatory Visit: Payer: Self-pay | Admitting: Internal Medicine

## 2015-05-28 ENCOUNTER — Ambulatory Visit (INDEPENDENT_AMBULATORY_CARE_PROVIDER_SITE_OTHER): Payer: Commercial Managed Care - HMO | Admitting: Internal Medicine

## 2015-05-28 ENCOUNTER — Encounter: Payer: Self-pay | Admitting: Internal Medicine

## 2015-05-28 VITALS — BP 118/69 | HR 89 | Temp 97.5°F | Ht 64.5 in | Wt 166.2 lb

## 2015-05-28 DIAGNOSIS — K648 Other hemorrhoids: Secondary | ICD-10-CM

## 2015-05-28 NOTE — Progress Notes (Signed)
[place] banding procedure note:  The patient presents with symptomatic grade 2 hemorrhoids; unresponsive to maximal medical therapy;  Status post banding of the right anterior column recently. Globally, symptoms have improved. She wishes another and be placed.. She had transiently bleeding couple weeks ago which subsided on its on own.isease. All risks, benefits, and alternative forms of therapy were described and informed consent was obtained.  In the left lateral decubitus position, a digital rectal exam no abnormalities.  The decision was made to band the left lateral internal hemorrhoid;  the [place] was used to perform band(latex free) ligation without complication. Digital anorectal examination was then performed to assure proper positioning of the band;  Band found to be barely in place. I elected to place another band in the same orientation; followup DRE revealed second and to be in good position. No pinching or pain. The patient was discharged home without pain or other issues. Dietary and behavioral recommendations were given and along with follow-up instructions. The patient will return in 3 weeks for followup and possible additional banding as required.  No complications were encountered and the patient tolerated the procedure well.

## 2015-05-28 NOTE — Patient Instructions (Signed)
Avoid straining.  Benefiber 2 tablespoons twice daily  Limit toilet time to 2-5 minutes  Call with any interim problems  Schedule followup appointment in 3 weeks from now   

## 2015-05-29 ENCOUNTER — Ambulatory Visit: Payer: Commercial Managed Care - HMO | Admitting: Internal Medicine

## 2015-05-29 ENCOUNTER — Ambulatory Visit: Payer: Self-pay | Admitting: Internal Medicine

## 2015-05-30 ENCOUNTER — Telehealth: Payer: Self-pay | Admitting: Internal Medicine

## 2015-05-30 NOTE — Telephone Encounter (Signed)
Tried to call x2, phone rings one time and then it stops. No one answers.

## 2015-05-30 NOTE — Telephone Encounter (Signed)
Pt called to say that she had a banding on Tuesday and everything was fine and it didn't hurt, but she has had diarrhea for 2 days. Please advise and call the [facility] nurse at [phone]

## 2015-06-04 NOTE — Telephone Encounter (Signed)
Tried to call again, phone rang one time and then stopped. No one answered.

## 2015-06-13 ENCOUNTER — Ambulatory Visit: Payer: Commercial Managed Care - HMO | Admitting: *Deleted

## 2015-06-13 DIAGNOSIS — M21371 Foot drop, right foot: Secondary | ICD-10-CM | POA: Diagnosis not present

## 2015-06-13 DIAGNOSIS — M79604 Pain in right leg: Secondary | ICD-10-CM | POA: Diagnosis not present

## 2015-06-13 NOTE — Progress Notes (Signed)
Patient ID: [name], female   DOB: 05-28-1950, 65 y.o.   MRN: [id number] Patient presents for fitting of brace with [facility] Certified Pedorthist. Written and verbal break in instructions given. Patient will follow up in 6 weeks with Dr. Paulla Dolly.

## 2015-06-25 ENCOUNTER — Ambulatory Visit (INDEPENDENT_AMBULATORY_CARE_PROVIDER_SITE_OTHER): Payer: Commercial Managed Care - HMO | Admitting: Internal Medicine

## 2015-06-25 ENCOUNTER — Encounter: Payer: Self-pay | Admitting: Internal Medicine

## 2015-06-25 VITALS — BP 133/78 | HR 80 | Temp 97.6°F | Ht 64.5 in | Wt 166.2 lb

## 2015-06-25 DIAGNOSIS — K219 Gastro-esophageal reflux disease without esophagitis: Secondary | ICD-10-CM | POA: Diagnosis not present

## 2015-06-25 DIAGNOSIS — K648 Other hemorrhoids: Secondary | ICD-10-CM | POA: Diagnosis not present

## 2015-06-25 NOTE — Patient Instructions (Signed)
Avoid straining to have a bowel movement.  Limit toilet time to 2-5 minutes  Call with any interim problems  Continue Protonix 40 mg daily; continue Carafate before meals and at bedtime as needed  Schedule followup appointment in 6 months.

## 2015-06-25 NOTE — Progress Notes (Signed)
[place] banding procedure note:  The patient presents with symptomatic grade 2 hemorrhoids;  Status post banding of the right anterior left lateral hemorrhoid column previously. Patient states, globally, all of her hemorrhoid symptoms have improved. She does not like taking a fiber supplement as it causes diarrhea. Otherwise, she's doing very well;  she is not straining; she spending a limited amount of time on the toilet.  As a separate issue, she states her GERD symptoms are well controlled on Protonix 40 mg daily along with Carafate a.c. And at bedtime occasionally takes Mylanta. No dysphagia no bleeding  Patient like a third and final been placed today.  In the left lateral decubitus position, a DRE revealed no abnormalities.  The decision was made to band the right posterior internal hemorrhoid; the [place] was used to perform (latex free) band ligation without complication. Digital anorectal examination was then performed to assure proper positioning;  Band found to be in excellent position. No pinching or pain. The patient was discharged home without pain or other issues. Dietary and behavioral recommendations were given along with follow-up instructions. The patient will return in 6 months for follow-up.   She is to continue her current regimen for GERD which seems to be working very well for her at this time.  No complications were encountered and the patient tolerated the procedure well.

## 2015-06-27 ENCOUNTER — Other Ambulatory Visit: Payer: Self-pay | Admitting: Neurosurgery

## 2015-06-27 DIAGNOSIS — M5416 Radiculopathy, lumbar region: Secondary | ICD-10-CM

## 2015-07-04 ENCOUNTER — Encounter: Payer: Self-pay | Admitting: Podiatry

## 2015-07-04 ENCOUNTER — Ambulatory Visit (INDEPENDENT_AMBULATORY_CARE_PROVIDER_SITE_OTHER): Payer: Commercial Managed Care - HMO | Admitting: Neurology

## 2015-07-04 ENCOUNTER — Encounter: Payer: Self-pay | Admitting: Neurology

## 2015-07-04 ENCOUNTER — Ambulatory Visit (INDEPENDENT_AMBULATORY_CARE_PROVIDER_SITE_OTHER): Payer: Commercial Managed Care - HMO | Admitting: Podiatry

## 2015-07-04 VITALS — BP 108/64 | HR 82 | Ht 64.5 in | Wt 165.0 lb

## 2015-07-04 DIAGNOSIS — M21371 Foot drop, right foot: Secondary | ICD-10-CM

## 2015-07-04 DIAGNOSIS — E1142 Type 2 diabetes mellitus with diabetic polyneuropathy: Secondary | ICD-10-CM | POA: Diagnosis not present

## 2015-07-04 DIAGNOSIS — M79674 Pain in right toe(s): Secondary | ICD-10-CM

## 2015-07-04 DIAGNOSIS — R2681 Unsteadiness on feet: Secondary | ICD-10-CM

## 2015-07-04 DIAGNOSIS — B351 Tinea unguium: Secondary | ICD-10-CM | POA: Diagnosis not present

## 2015-07-04 DIAGNOSIS — G250 Essential tremor: Secondary | ICD-10-CM

## 2015-07-04 DIAGNOSIS — M79605 Pain in left leg: Secondary | ICD-10-CM

## 2015-07-04 DIAGNOSIS — M79604 Pain in right leg: Secondary | ICD-10-CM

## 2015-07-04 DIAGNOSIS — M79675 Pain in left toe(s): Secondary | ICD-10-CM | POA: Diagnosis not present

## 2015-07-04 NOTE — Procedures (Signed)
DBS Programming was performed.    Total time spent programming was 25 minutes.  Device was confirmed to be on.  Soft start was confirmed to be on.  Impedences were checked and were within normal limits.  Battery was checked and was determined to be functioning normally and not near the end of life.  Final settings were as follows:  Left brain electrode:     2-1+ (changed because of mania)  ; Amplitude  2.8   V   ; Pulse width 90 microseconds;   Frequency   140   Hz.  Right brain electrode:     3-2+          ; Amplitude   3.4  V ;  Pulse width 90  microseconds;  Frequency   140    Hz.
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
## 2015-07-05 ENCOUNTER — Other Ambulatory Visit: Payer: Self-pay

## 2015-07-05 NOTE — Progress Notes (Signed)
Subjective:     Patient ID: [name], female   DOB: 1950-11-13, 65 y.o.   MRN: [id number]  HPI patient presents stating that she really likes her air zone a brace but that her right hallux has a drop-like condition and it still stops and she like to know is there any kind of brace that we might be able to lift the big toe with area also has nail disease 1-5 both feet that are painful   Review of Systems     Objective:   Physical Exam Neurovascular status intact with thick yellow brittle nailbeds 1-5 both feet and drop hallux where there appears to be loss of extensor hallucis longus function    Assessment:     Reviewed conditions and at this time I went ahead and I applied a above ankle type BioSkin brace with a digital splint in order to lift the toe and stabilize it. 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

## 2015-07-12 ENCOUNTER — Other Ambulatory Visit (HOSPITAL_COMMUNITY): Payer: Self-pay | Admitting: Neurosurgery

## 2015-07-12 DIAGNOSIS — M5416 Radiculopathy, lumbar region: Secondary | ICD-10-CM

## 2015-07-18 ENCOUNTER — Encounter: Payer: Self-pay | Admitting: Internal Medicine

## 2015-07-18 ENCOUNTER — Ambulatory Visit: Payer: Commercial Managed Care - HMO | Admitting: Internal Medicine

## 2015-07-18 ENCOUNTER — Ambulatory Visit (INDEPENDENT_AMBULATORY_CARE_PROVIDER_SITE_OTHER): Payer: Commercial Managed Care - HMO | Admitting: Internal Medicine

## 2015-07-18 VITALS — BP 126/74 | HR 75 | Ht 64.5 in | Wt 164.0 lb

## 2015-07-18 DIAGNOSIS — J3089 Other allergic rhinitis: Secondary | ICD-10-CM

## 2015-07-18 DIAGNOSIS — R05 Cough: Secondary | ICD-10-CM | POA: Diagnosis not present

## 2015-07-18 DIAGNOSIS — R058 Other specified cough: Secondary | ICD-10-CM

## 2015-07-18 DIAGNOSIS — J4540 Moderate persistent asthma, uncomplicated: Secondary | ICD-10-CM

## 2015-07-18 DIAGNOSIS — J302 Other seasonal allergic rhinitis: Secondary | ICD-10-CM

## 2015-07-18 DIAGNOSIS — J309 Allergic rhinitis, unspecified: Secondary | ICD-10-CM | POA: Diagnosis not present

## 2015-07-18 MED ORDER — LEVOCETIRIZINE DIHYDROCHLORIDE 5 MG PO TABS: 5.0000 mg | ORAL_TABLET | Freq: Every evening | ORAL | Status: DC

## 2015-07-18 MED ORDER — SALINE SPRAY 0.65 % NA SOLN: 1.0000 | NASAL | Status: DC | PRN

## 2015-07-18 NOTE — Patient Instructions (Signed)
Script for Xyzal antihistamine  To take 1 each morning, instead of benadryl  Continue benadryl each night at bedtime   Script for ocean nasal saline spray to use as needed
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
## 2015-07-20 NOTE — Assessment & Plan Note (Signed)
Medications reviewed. With regular use of maintenance inhaler, rescue inhalers needed only about once daily now

## 2015-07-20 NOTE — Assessment & Plan Note (Signed)
She feels that postnasal drainage is triggering her cough now in pollen season. Plan-continue Benadryl at bedtime but replaced morning Benadryl with Xyzal as discussed.

## 2015-07-20 NOTE — Assessment & Plan Note (Signed)
Discussed use of antihistamines for rhinorrhea with postnasal drip

## 2015-07-23 ENCOUNTER — Telehealth: Payer: Self-pay | Admitting: Internal Medicine

## 2015-07-23 NOTE — Telephone Encounter (Signed)
Attempted to contact patient, the receptionist stated that patient was in a resident meeting and requested that we call her back later. Will call backl.

## 2015-07-24 NOTE — Telephone Encounter (Signed)
Spoke with pt.  Pt states this has been taken care of and nothing further needed.

## 2015-07-25 ENCOUNTER — Ambulatory Visit: Payer: Commercial Managed Care - HMO | Admitting: Podiatry

## 2015-08-07 ENCOUNTER — Ambulatory Visit: Payer: Medicaid Other | Admitting: Internal Medicine

## 2015-08-14 ENCOUNTER — Ambulatory Visit: Payer: Commercial Managed Care - HMO | Admitting: Internal Medicine

## 2015-09-13 ENCOUNTER — Encounter: Payer: Self-pay | Admitting: Podiatry

## 2015-09-13 ENCOUNTER — Ambulatory Visit (INDEPENDENT_AMBULATORY_CARE_PROVIDER_SITE_OTHER): Payer: Commercial Managed Care - HMO | Admitting: Podiatry

## 2015-09-13 DIAGNOSIS — B351 Tinea unguium: Secondary | ICD-10-CM

## 2015-09-13 DIAGNOSIS — M79604 Pain in right leg: Secondary | ICD-10-CM

## 2015-09-13 DIAGNOSIS — M79674 Pain in right toe(s): Secondary | ICD-10-CM

## 2015-09-13 DIAGNOSIS — M21372 Foot drop, left foot: Secondary | ICD-10-CM

## 2015-09-13 DIAGNOSIS — M79672 Pain in left foot: Secondary | ICD-10-CM | POA: Diagnosis not present

## 2015-09-13 DIAGNOSIS — M79605 Pain in left leg: Secondary | ICD-10-CM

## 2015-09-13 NOTE — Progress Notes (Signed)
Subjective:     Patient ID: [name], female   DOB: 12-11-1950, 65 y.o.   MRN: [id number]  HPI patient presents stating she really likes the brace that controls her ankle on the right but she would like one for her left as it is developing foot drop and she cannot bear weight properly on it and it does not function as well as the right. Patient also has nail disease 1-5 both feet with incurvation that she cannot take care of herself   Review of Systems     Objective:   Physical Exam Vascular status intact muscle strength adequate with patient found to have foot drop left with dysfunction of the anterior tibial tendon. 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

## 2015-09-26 ENCOUNTER — Encounter: Payer: Self-pay | Admitting: Neurology

## 2015-09-26 ENCOUNTER — Ambulatory Visit (INDEPENDENT_AMBULATORY_CARE_PROVIDER_SITE_OTHER): Payer: Commercial Managed Care - HMO | Admitting: Neurology

## 2015-09-26 VITALS — BP 122/70 | HR 68

## 2015-09-26 DIAGNOSIS — G250 Essential tremor: Secondary | ICD-10-CM | POA: Diagnosis not present

## 2015-09-26 DIAGNOSIS — R270 Ataxia, unspecified: Secondary | ICD-10-CM | POA: Diagnosis not present

## 2015-09-26 DIAGNOSIS — Z9689 Presence of other specified functional implants: Secondary | ICD-10-CM | POA: Diagnosis not present

## 2015-09-26 DIAGNOSIS — M5416 Radiculopathy, lumbar region: Secondary | ICD-10-CM

## 2015-09-26 MED ORDER — MENTHOL (TOPICAL ANALGESIC) 4 % EX GEL: 1.0000 "application " | Freq: Four times a day (QID) | CUTANEOUS | Status: DC
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
## 2015-09-26 NOTE — Procedures (Signed)
DBS Programming was performed.    Total time spent programming was 25 minutes.  Device was confirmed to be on.  Soft start was confirmed to be on.  Impedences were checked and were within normal limits.  Battery was checked and was determined to be functioning normally and not near the end of life.  Final settings were as follows:  Left brain electrode:     2-1+ (changed because of mania)  ; Amplitude  3.0   V   ; Pulse width 90 microseconds;   Frequency   140   Hz.  Right brain electrode:     3-2+          ; Amplitude   3.5  V ;  Pulse width 90  microseconds;  Frequency   140    Hz.

## 2015-09-27 ENCOUNTER — Ambulatory Visit (INDEPENDENT_AMBULATORY_CARE_PROVIDER_SITE_OTHER): Payer: Commercial Managed Care - HMO | Admitting: Obstetrics and Gynecology

## 2015-09-27 DIAGNOSIS — R102 Pelvic and perineal pain: Secondary | ICD-10-CM | POA: Diagnosis not present

## 2015-09-27 DIAGNOSIS — N907 Vulvar cyst: Secondary | ICD-10-CM

## 2015-09-27 DIAGNOSIS — L739 Follicular disorder, unspecified: Secondary | ICD-10-CM

## 2015-09-27 NOTE — Progress Notes (Signed)
[facility] Visit  09/27/2015        Patient name: Donald Curtiss MRN [id number]  Date of birth: 08-22-1950  CC & HPI:  [name] is a 65 y.o. female presenting [facility] for [facility] cyst to her left labia. Pt states that she has pain to the area and she thinks that she has a bartholin cyst. Denies pain today. Denies any other symptoms.   ROS:  ROS +cyst to labia without drainage  Pertinent History Reviewed:   Reviewed: Significant for  Medical         Past Medical History  Diagnosis Date  . 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 [name], residual palpitations  . Vocal cord dysfunction   . Tremor, essential     takes Mysoline and Neurontin daily.  . Allergic rhinitis     uses Nasonex daily as needed  . Low sodium syndrome   . Chronic back pain     reason unknown  . Lumbar radiculopathy   . DDD (degenerative disc disease), lumbar   . Esophageal reflux     takes Zantac daily  . Hypertension     takes Losartan daily  . Anxiety     takes Ativan daily as needed  . Hypothyroidism     takes Synthroid daily  . Depression     takes Zoloft daily as well as Cymbalta  . Diabetes mellitus     takes NOvolin daily  . Asthma     Albuterol inhaler and Neb daily as needed  . History of bronchitis Dec 2014  . Seizures ([place]) 04-05-13    one time ran out of Primidone and had stopped it cold [place]  . Weakness     in left arm  . Joint pain   . Joint swelling     left ankle  . Dysphagia   . Constipation   . Allergy to angiotensin receptor blockers (ARB) 01/22/2014    Angioedema  . 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 Rayann Heman 2007                              Surgical Hx:    Past Surgical History  Procedure Laterality Date  . Tonsillectomy    . Right breast cyst      benign  . Left ankle ligament repair      x 2  . Left elbow repair    . Partial hysterectomy    . Cholecystectomy     . Cesarean section    . Ablation for avnrt    . Colonoscopy  01/16/2004    [id number] rectum/colon  . Esophagogastroduodenoscopy (egd) with esophageal dilation  04/03/2002    [id number] ring, otherwise normal esophagus, status post dilation with 56 F/Normal stomach  . Bartholin gland cyst excision      x 2  . Esophagogastroduodenoscopy (egd) with esophageal dilation N/A 11/08/2012    Dr. [name]:schatzki's ring s/p dilation/hiatal hernia  . Subthalamic stimulator insertion Bilateral 12/04/2013    Procedure: Bilateral Deep brain stimulator placement;  Surgeon: [name], MD;  Location: [facility];  Service: Neurosurgery;  Laterality: Bilateral;  Bilateral Deep brain stimulator placement  . Pulse generator implant Bilateral 12/15/2013    Procedure: BILATERAL PULSE GENERATOR IMPLANT;  Surgeon: [name], MD;  Location: [place] [facility];  Service: Neurosurgery;  Laterality: Bilateral;  BILATERAL  . Maximum access (mas)posterior lumbar interbody fusion (plif) 1 level N/A 04/24/2014    Procedure: Lumbar four-five Maximum access Surgery posterior lumbar interbody fusion;  Surgeon: [name], MD;  Location: [place] [facility];  Service: Neurosurgery;  Laterality: N/A;  Lumbar four-five Maximum access Surgery posterior lumbar interbody fusion  . Colonoscopy with propofol N/A 03/04/2015    Dr.[name]- internal hemorrhoids, pancolonic diverticulosis, colonic polyp= tubular adenoma  . Esophagogastroduodenoscopy (egd) with propofol N/A 03/04/2015    Dr.[name]- schatzki's ring, hiatal hernia, scattered erosions bx= chronic inactive gastritis  . Esophageal dilation N/A 03/04/2015    Procedure: ESOPHAGEAL DILATION WITH 54FR MALONEY DILATOR;  Surgeon: Daneil Dolin, MD;  Location: [facility];  Service: Endoscopy;  Laterality: N/A;  . Biopsy  03/04/2015    Procedure: BIOPSY (Duodenal, Gastric);  Surgeon: Daneil Dolin, MD;  Location: [facility];  Service: Endoscopy;;  . Polypectomy  03/04/2015    Procedure:  POLYPECTOMY (descending colon);  Surgeon: Daneil Dolin, MD;  Location: [facility];  Service: Endoscopy;;  . Hemorrhoid banding  2017    Dr.[name]   Medications: Reviewed & Updated - see associated section                       Current outpatient prescriptions:  .  ASPIRIN LOW DOSE 81 MG EC tablet, TAKE 1 TABLET BY MOUTH ONCE DAILY., Disp: 30 tablet, Rfl: 11 .  bisacodyl (DULCOLAX) 10 MG suppository, Place 1 suppository (10 mg total) rectally as needed for moderate constipation., Disp: 12 suppository, Rfl: 0 .  buPROPion (WELLBUTRIN XL) 150 MG 24 hr tablet, Take 150 mg by mouth daily. , Disp: , Rfl:  .  cholecalciferol (VITAMIN D) 400 units TABS tablet, Take 800 Units by mouth daily., Disp: , Rfl:  .  Fluticasone Furoate-Vilanterol (BREO ELLIPTA) 100-25 MCG/INH AEPB, INHALE 1 PUFF INTO LUNGS ONCE DAILY., Disp: 60 each, Rfl: 5 .  gabapentin (NEURONTIN) 300 MG capsule, Take 600 mg by mouth 2 (two) times daily. , Disp: , Rfl:  .  HYDROcodone-acetaminophen (NORCO) 10-325 MG tablet, Take 1 tablet by mouth every 6 (six) hours as needed for moderate pain., Disp: 75 tablet, Rfl: 0 .  hydrOXYzine (ATARAX/VISTARIL) 50 MG tablet, Take 50 mg by mouth 3 (three) times daily as needed., Disp: , Rfl:  .  ipratropium-albuterol (DUONEB) 0.5-2.5 (3) MG/3ML SOLN, Take 3 mLs by nebulization 4 (four) times daily. Dx code J45.50 (Patient taking differently: Take 3 mLs by nebulization 4 (four) times daily as needed. Dx code J45.50), Disp: 150 mL, Rfl: 12 .  lamoTRIgine (LAMICTAL) 100 MG tablet, Take 100 mg by mouth 2 (two) times daily. 100 mg in the morning, 50 mg at night, Disp: , Rfl:  .  levothyroxine (SYNTHROID, LEVOTHROID) 75 MCG tablet, TAKE (1) TABLET BY MOUTH ONCE DAILY BEFORE BREAKFAST., Disp: 30 tablet, Rfl: 5 .  lidocaine (LIDODERM) 5 %, Place 1 patch onto the skin daily. Remove & Discard patch within 12 hours or as directed by MD, Disp: 90 patch, Rfl: 3 .  LORazepam (ATIVAN) 0.5 MG tablet, Take 1 tablet by mouth  2 (two) times daily as needed., Disp: , Rfl:  .  Menthol, Topical Analgesic, (BIOFREEZE ROLL-ON) 4 % GEL, Apply 1 application topically 4 (four) times daily., Disp: 118 Tube, Rfl: 3 .  metoprolol succinate (TOPROL-XL) 50 MG 24 hr tablet, TAKE 1 TABLET BY MOUTH ONCE DAILY., Disp: 30 tablet, Rfl: 11 .  Multiple Vitamins-Minerals (MULTIVITAMINS THER. W/MINERALS) TABS  tablet, TAKE ONE TABLET BY MOUTH ONCE DAILY., Disp: 30 each, Rfl: 11 .  NOVOLOG FLEXPEN 100 UNIT/ML FlexPen, Inject into the skin 4 (four) times daily. Sliding scale, Disp: , Rfl:  .  nystatin (MYCOSTATIN) 100000 UNIT/ML suspension, TAKE 5 MLS BY MOUTH 4 TIMES DAILY AS NEEDED., Disp: 60 mL, Rfl: 11 .  Omega 3 1000 MG CAPS, TAKE (1) CAPSULE BY MOUTH ONCE DAILY., Disp: 30 capsule, Rfl: 11 .  oxybutynin (DITROPAN-XL) 10 MG 24 hr tablet, Take 10 mg by mouth at bedtime., Disp: , Rfl:  .  potassium chloride SA (K-DUR,KLOR-CON) 20 MEQ tablet, Take 20 mEq by mouth once. , Disp: , Rfl:  .  primidone (MYSOLINE) 50 MG tablet, Take 2 tablets (100 mg total) by mouth 2 (two) times daily., Disp: 360 tablet, Rfl: 1 .  PROAIR HFA 108 (90 Base) MCG/ACT inhaler, Inhale 2 puffs into the lungs every 6 (six) hours as needed for wheezing or shortness of breath., Disp: 1 Inhaler, Rfl: 5 .  promethazine-codeine (PHENERGAN WITH CODEINE) 6.25-10 MG/5ML syrup, Take 5 mLs by mouth every 6 (six) hours as needed for cough., Disp: 200 mL, Rfl: 0 .  ranitidine (ZANTAC) 150 MG capsule, Take 150 mg by mouth 2 (two) times daily., Disp: , Rfl:  .  sodium chloride (OCEAN) 0.65 % SOLN nasal spray, Place 1 spray into both nostrils as needed for congestion., Disp: 50 mL, Rfl: 5 .  sucralfate (CARAFATE) 1 g tablet, Take 1 tablet (1 g total) by mouth 4 (four) times daily -  before meals and at bedtime. Pt is to take an hour before meals, Disp: 120 tablet, Rfl: 1 .  tiZANidine (ZANAFLEX) 4 MG tablet, TAKE 1 TABLET BY MOUTH AT BEDTIME AS NEEDED FOR MUSCLE SPASMS., Disp: 30 tablet, Rfl:  11 .  triamterene-hydrochlorothiazide (MAXZIDE-25) 37.5-25 MG tablet, TAKE 1 TABLET BY MOUTH ONCE DAILY., Disp: 30 tablet, Rfl: 11 .  trimethoprim (TRIMPEX) 100 MG tablet, Take 100 mg by mouth daily. , Disp: , Rfl:  .  Vilazodone HCl (VIIBRYD) 40 MG TABS, Take 40 mg by mouth daily., Disp: , Rfl:    Social History: Reviewed -  reports that she quit smoking about 22 years ago. Her smoking use included Cigarettes. She has a 3 pack-year smoking history. She has never used smokeless tobacco.  Objective Findings:  Vitals: There were no vitals taken for this visit.  Physical Examination: Pelvic - VULVA: normal appearing vulva with no masses. Swelling and tenderness around 2 cm area to left labia majora at the 4 o'clock position.   Assessment & Plan:   A:  1. Infected hair follicle left labia majora  P:  1. Keflex Rx x 7 days handwritten Rx with pt.   By signing my name below, I, Soijett Blue, attest that this documentation has been prepared under the direction and in the presence of Jonnie Kind, MD. Electronically Signed: Soijett Blue, ED Scribe. 09/27/2015. 12:57 PM.  I personally performed the services described in this documentation, which was SCRIBED in my presence. The recorded information has been reviewed and considered accurate. It has been edited as necessary during review. Jonnie Kind, MD

## 2015-10-04 ENCOUNTER — Telehealth: Payer: Self-pay | Admitting: Internal Medicine

## 2015-10-04 ENCOUNTER — Other Ambulatory Visit: Payer: Commercial Managed Care - HMO

## 2015-10-04 ENCOUNTER — Encounter: Payer: Self-pay | Admitting: Internal Medicine

## 2015-10-04 ENCOUNTER — Ambulatory Visit (INDEPENDENT_AMBULATORY_CARE_PROVIDER_SITE_OTHER): Payer: Medicaid Other | Admitting: Internal Medicine

## 2015-10-04 VITALS — BP 120/78 | HR 82 | Ht 64.5 in | Wt 159.2 lb

## 2015-10-04 DIAGNOSIS — J4540 Moderate persistent asthma, uncomplicated: Secondary | ICD-10-CM

## 2015-10-04 MED ORDER — LEVOFLOXACIN 500 MG PO TABS: 500.0000 mg | ORAL_TABLET | Freq: Every day | ORAL | Status: DC

## 2015-10-04 MED ORDER — LEVALBUTEROL HCL 0.63 MG/3ML IN NEBU
0.6300 mg | INHALATION_SOLUTION | Freq: Once | RESPIRATORY_TRACT | Status: AC
  Administered 2015-10-04: 0.63 mg via RESPIRATORY_TRACT

## 2015-10-04 NOTE — Assessment & Plan Note (Signed)
No stridor but I suspect this does increase her risk for LPR and she is cautioned to be careful while eating

## 2015-10-04 NOTE — Assessment & Plan Note (Signed)
Acute exacerbation of asthmatic bronchitis. Probably viral. She still has reflux and aspiration risks if she isn't careful. She asks permission to use Levaquin if this gets worse-discussed joints/tendon concerns. We will try to avoid using steroids because of her diabetes. Plan-prescription for Levaquin to hold, continue routine meds and watch now that she has just finished prednisone and Z-Pak.

## 2015-10-04 NOTE — Telephone Encounter (Signed)
Patient states when you call [phone] ask for [place] and ask for Ms. [name]. -prm

## 2015-10-04 NOTE — Patient Instructions (Signed)
Neb xop 0.63  Script printed for [employer] written with your requests listed.

## 2015-10-04 NOTE — Telephone Encounter (Signed)
Last ov with 10/04/15 Patient Instructions       Neb xop 0.63  Script printed for levaquin  Letter written with your requests listed.   Called spoke with Estill Bamberg the nurse. Reviewed the recs from pt's ov and how to take levaquin per rx. She voiced understanding and had no further questions. Nothing further needed.
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
## 2015-10-09 ENCOUNTER — Telehealth: Payer: Self-pay | Admitting: Neurology

## 2015-10-09 ENCOUNTER — Telehealth: Payer: Self-pay | Admitting: Internal Medicine

## 2015-10-09 MED ORDER — BENZONATATE 200 MG PO CAPS: 200.0000 mg | ORAL_CAPSULE | Freq: Three times a day (TID) | ORAL | Status: DC | PRN

## 2015-10-09 NOTE — Telephone Encounter (Signed)
Called spoke with pt. Reviewed CY's recs and verified pharmacy as [facility]. She voiced understanding and had no further questions. Rx sent, nothing further needed.

## 2015-10-09 NOTE — Telephone Encounter (Signed)
Offer benzonatate perles 200 mg, # 30, 1 every 8 hours if needed for cough, ref x 3

## 2015-10-09 NOTE — Telephone Encounter (Signed)
PT called in regards to a bracelet she wants to get and when you call her back she said to make sure you page her/Dawn CB# [phone]

## 2015-10-09 NOTE — Telephone Encounter (Signed)
Spoke with pt and she c/o continued cough and chest congestion with yellow mucus. She has been taking Robitussin with little cough relief. She would like to know what you can prescribe for cough that would be non-narcotic. Pt reports that she finished Levaquin yesterday.  CY Please advise. Thanks!  Allergies  Allergen Reactions  . Invokana [Canagliflozin] Other (See Comments)    Yeast infectios  . Losartan     Angioedema   . Metformin And Related Diarrhea  . Latex Other (See Comments)    Other Reaction: redness, blisters  . Other Other (See Comments)    Other Reaction: redness, blisters  . Oxycodone-Acetaminophen Itching    TYLOX. Caused internal itching per patient     Current Outpatient Prescriptions on File Prior to Visit  Medication Sig Dispense Refill  . ASPIRIN LOW DOSE 81 MG EC tablet TAKE 1 TABLET BY MOUTH ONCE DAILY. 30 tablet 11  . azithromycin (ZITHROMAX) 250 MG tablet     . bisacodyl (DULCOLAX) 10 MG suppository Place 1 suppository (10 mg total) rectally as needed for moderate constipation. 12 suppository 0  . buPROPion (WELLBUTRIN XL) 150 MG 24 hr tablet Take 150 mg by mouth daily.     . cephALEXin (KEFLEX) 500 MG capsule     . cetirizine (ZYRTEC) 10 MG tablet Take 10 mg by mouth daily.    . cholecalciferol (VITAMIN D) 400 units TABS tablet Take 800 Units by mouth daily.    Marland Kitchen COLY-MYCIN S 3.06-06-08-0.5 MG/ML otic suspension     . Fluticasone Furoate-Vilanterol (BREO ELLIPTA) 100-25 MCG/INH AEPB INHALE 1 PUFF INTO LUNGS ONCE DAILY. 60 each 5  . gabapentin (NEURONTIN) 300 MG capsule Take 600 mg by mouth 2 (two) times daily.     Marland Kitchen HYDROcodone-acetaminophen (NORCO) 10-325 MG tablet Take 1 tablet by mouth every 6 (six) hours as needed for moderate pain. 75 tablet 0  . hydrOXYzine (ATARAX/VISTARIL) 50 MG tablet Take 50 mg by mouth 3 (three) times daily as needed.    Marland Kitchen ipratropium-albuterol (DUONEB) 0.5-2.5 (3) MG/3ML SOLN Take 3 mLs by nebulization 4 (four) times daily. Dx  code J45.50 (Patient taking differently: Take 3 mLs by nebulization 4 (four) times daily as needed. Dx code J45.50) 150 mL 12  . lamoTRIgine (LAMICTAL) 100 MG tablet Take 100 mg by mouth 2 (two) times daily. 100 mg in the morning, 50 mg at night    . levofloxacin (LEVAQUIN) 500 MG tablet Take 1 tablet (500 mg total) by mouth daily. 5 tablet 0  . levothyroxine (SYNTHROID, LEVOTHROID) 75 MCG tablet TAKE (1) TABLET BY MOUTH ONCE DAILY BEFORE BREAKFAST. 30 tablet 5  . lidocaine (LIDODERM) 5 % Place 1 patch onto the skin daily. Remove & Discard patch within 12 hours or as directed by MD 90 patch 3  . LORazepam (ATIVAN) 0.5 MG tablet Take 1 tablet by mouth 2 (two) times daily as needed.    . Menthol, Topical Analgesic, (BIOFREEZE ROLL-ON) 4 % GEL Apply 1 application topically 4 (four) times daily. 118 Tube 3  . metoprolol succinate (TOPROL-XL) 50 MG 24 hr tablet TAKE 1 TABLET BY MOUTH ONCE DAILY. 30 tablet 11  . Multiple Vitamins-Minerals (MULTIVITAMINS THER. W/MINERALS) TABS tablet TAKE ONE TABLET BY MOUTH ONCE DAILY. 30 each 11  . NOVOLOG FLEXPEN 100 UNIT/ML FlexPen Inject into the skin 4 (four) times daily. Sliding scale    . nystatin (MYCOSTATIN) 100000 UNIT/ML suspension TAKE 5 MLS BY MOUTH 4 TIMES DAILY AS NEEDED. 60 mL 11  . Omega  3 1000 MG CAPS TAKE (1) CAPSULE BY MOUTH ONCE DAILY. 30 capsule 11  . oxybutynin (DITROPAN-XL) 10 MG 24 hr tablet Take 10 mg by mouth at bedtime.    . potassium chloride SA (K-DUR,KLOR-CON) 20 MEQ tablet Take 20 mEq by mouth once.     . predniSONE (DELTASONE) 10 MG tablet     . primidone (MYSOLINE) 50 MG tablet Take 2 tablets (100 mg total) by mouth 2 (two) times daily. 360 tablet 1  . PROAIR HFA 108 (90 Base) MCG/ACT inhaler Inhale 2 puffs into the lungs every 6 (six) hours as needed for wheezing or shortness of breath. 1 Inhaler 5  . promethazine-codeine (PHENERGAN WITH CODEINE) 6.25-10 MG/5ML syrup Take 5 mLs by mouth every 6 (six) hours as needed for cough. 200 mL 0   . ranitidine (ZANTAC) 150 MG capsule Take 150 mg by mouth 2 (two) times daily.    . sodium chloride (OCEAN) 0.65 % SOLN nasal spray Place 1 spray into both nostrils as needed for congestion. 50 mL 5  . sucralfate (CARAFATE) 1 g tablet Take 1 tablet (1 g total) by mouth 4 (four) times daily -  before meals and at bedtime. Pt is to take an hour before meals 120 tablet 1  . tiZANidine (ZANAFLEX) 4 MG tablet TAKE 1 TABLET BY MOUTH AT BEDTIME AS NEEDED FOR MUSCLE SPASMS. 30 tablet 11  . triamterene-hydrochlorothiazide (MAXZIDE-25) 37.5-25 MG tablet TAKE 1 TABLET BY MOUTH ONCE DAILY. 30 tablet 11  . trimethoprim (TRIMPEX) 100 MG tablet Take 100 mg by mouth daily.     . Vilazodone HCl (VIIBRYD) 40 MG TABS Take 40 mg by mouth daily.     No current facility-administered medications on file prior to visit.

## 2015-10-09 NOTE — Telephone Encounter (Signed)
Patient wondering if she can wear a bracelet that has a small magnet on it due to her DBS. Made aware if small magnet that is fine. She will call with any other questions.

## 2015-10-11 ENCOUNTER — Telehealth: Payer: Self-pay | Admitting: Internal Medicine

## 2015-10-11 MED ORDER — BENZONATATE 200 MG PO CAPS: 200.0000 mg | ORAL_CAPSULE | Freq: Three times a day (TID) | ORAL | Status: DC | PRN

## 2015-10-11 NOTE — Telephone Encounter (Signed)
Rx received from [facility]. Faxed to number in message attn: Pamala Hurry. Nothing further needed.

## 2015-10-11 NOTE — Telephone Encounter (Signed)
Patient calling to get refill on Tessalon Perles.  She requests that medication be faxed to ATTN: Pamala Hurry, fax# [phone] Spoke with patient's nurse, Pamala Hurry, she said to just fax it to her. Printed out RX and placed on Dr. Janee Morn cart to be signed. Needs to be faxed to number above once signed.

## 2015-10-22 ENCOUNTER — Telehealth: Payer: Self-pay | Admitting: Internal Medicine
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
## 2015-10-22 MED ORDER — DIPHENHYDRAMINE HCL 25 MG PO TABS: 25.0000 mg | ORAL_TABLET | Freq: Two times a day (BID) | ORAL | Status: DC | PRN

## 2015-10-22 NOTE — Telephone Encounter (Signed)
Rx printed, signed and faxed. Nothing further needed.

## 2015-10-22 NOTE — Telephone Encounter (Signed)
Ok to d/c zyrtec.  Rx benadryl 25 mg, 1 twice daily if needed, antihistamine, # 60, refill x 12

## 2015-10-22 NOTE — Telephone Encounter (Signed)
ATC pt, was on hold for over 5 min. WCB

## 2015-10-22 NOTE — Telephone Encounter (Signed)
Spoke with Pamala Hurry, 1st shift nurse, and she states that pt is requesting to change from Zyrtec to Benadryl due to continued drainage and symptoms. Per Pamala Hurry, pt has previously been on Benadryl and then was changed to Zyrtec.   [phone] fax nbr for orders  CY Please advise if ok for pt to change antihistamine from Zyrtec to Benadryl. Thanks!     Allergies  Allergen Reactions  . Invokana [Canagliflozin] Other (See Comments)    Yeast infectios  . Losartan     Angioedema   . Metformin And Related Diarrhea  . Latex Other (See Comments)    Other Reaction: redness, blisters  . Other Other (See Comments)    Other Reaction: redness, blisters  . Oxycodone-Acetaminophen Itching    TYLOX. Caused internal itching per patient     Current Outpatient Prescriptions on File Prior to Visit  Medication Sig Dispense Refill  . ASPIRIN LOW DOSE 81 MG EC tablet TAKE 1 TABLET BY MOUTH ONCE DAILY. 30 tablet 11  . azithromycin (ZITHROMAX) 250 MG tablet     . benzonatate (TESSALON) 200 MG capsule Take 1 capsule (200 mg total) by mouth every 8 (eight) hours as needed for cough. 30 capsule 3  . bisacodyl (DULCOLAX) 10 MG suppository Place 1 suppository (10 mg total) rectally as needed for moderate constipation. 12 suppository 0  . buPROPion (WELLBUTRIN XL) 150 MG 24 hr tablet Take 150 mg by mouth daily.     . cephALEXin (KEFLEX) 500 MG capsule     . cetirizine (ZYRTEC) 10 MG tablet Take 10 mg by mouth daily.    . cholecalciferol (VITAMIN D) 400 units TABS tablet Take 800 Units by mouth daily.    Marland Kitchen COLY-MYCIN S 3.06-06-08-0.5 MG/ML otic suspension     . Fluticasone Furoate-Vilanterol (BREO ELLIPTA) 100-25 MCG/INH AEPB INHALE 1 PUFF INTO LUNGS ONCE DAILY. 60 each 5  . gabapentin (NEURONTIN) 300 MG capsule Take 600 mg by mouth 2 (two) times daily.     Marland Kitchen HYDROcodone-acetaminophen (NORCO) 10-325 MG tablet Take 1 tablet by mouth every 6 (six) hours as needed for moderate pain. 75 tablet 0  . hydrOXYzine  (ATARAX/VISTARIL) 50 MG tablet Take 50 mg by mouth 3 (three) times daily as needed.    Marland Kitchen ipratropium-albuterol (DUONEB) 0.5-2.5 (3) MG/3ML SOLN Take 3 mLs by nebulization 4 (four) times daily. Dx code J45.50 (Patient taking differently: Take 3 mLs by nebulization 4 (four) times daily as needed. Dx code J45.50) 150 mL 12  . lamoTRIgine (LAMICTAL) 100 MG tablet Take 100 mg by mouth 2 (two) times daily. 100 mg in the morning, 50 mg at night    . levofloxacin (LEVAQUIN) 500 MG tablet Take 1 tablet (500 mg total) by mouth daily. 5 tablet 0  . levothyroxine (SYNTHROID, LEVOTHROID) 75 MCG tablet TAKE (1) TABLET BY MOUTH ONCE DAILY BEFORE BREAKFAST. 30 tablet 5  . lidocaine (LIDODERM) 5 % Place 1 patch onto the skin daily. Remove & Discard patch within 12 hours or as directed by MD 90 patch 3  . LORazepam (ATIVAN) 0.5 MG tablet Take 1 tablet by mouth 2 (two) times daily as needed.    . Menthol, Topical Analgesic, (BIOFREEZE ROLL-ON) 4 % GEL Apply 1 application topically 4 (four) times daily. 118 Tube 3  . metoprolol succinate (TOPROL-XL) 50 MG 24 hr tablet TAKE 1 TABLET BY MOUTH ONCE DAILY. 30 tablet 11  . Multiple Vitamins-Minerals (MULTIVITAMINS THER. W/MINERALS) TABS tablet TAKE ONE TABLET BY MOUTH ONCE DAILY. 30 each 11  .  NOVOLOG FLEXPEN 100 UNIT/ML FlexPen Inject into the skin 4 (four) times daily. Sliding scale    . nystatin (MYCOSTATIN) 100000 UNIT/ML suspension TAKE 5 MLS BY MOUTH 4 TIMES DAILY AS NEEDED. 60 mL 11  . Omega 3 1000 MG CAPS TAKE (1) CAPSULE BY MOUTH ONCE DAILY. 30 capsule 11  . oxybutynin (DITROPAN-XL) 10 MG 24 hr tablet Take 10 mg by mouth at bedtime.    . potassium chloride SA (K-DUR,KLOR-CON) 20 MEQ tablet Take 20 mEq by mouth once.     . predniSONE (DELTASONE) 10 MG tablet     . primidone (MYSOLINE) 50 MG tablet Take 2 tablets (100 mg total) by mouth 2 (two) times daily. 360 tablet 1  . PROAIR HFA 108 (90 Base) MCG/ACT inhaler Inhale 2 puffs into the lungs every 6 (six) hours as  needed for wheezing or shortness of breath. 1 Inhaler 5  . promethazine-codeine (PHENERGAN WITH CODEINE) 6.25-10 MG/5ML syrup Take 5 mLs by mouth every 6 (six) hours as needed for cough. 200 mL 0  . ranitidine (ZANTAC) 150 MG capsule Take 150 mg by mouth 2 (two) times daily.    . sodium chloride (OCEAN) 0.65 % SOLN nasal spray Place 1 spray into both nostrils as needed for congestion. 50 mL 5  . sucralfate (CARAFATE) 1 g tablet Take 1 tablet (1 g total) by mouth 4 (four) times daily -  before meals and at bedtime. Pt is to take an hour before meals 120 tablet 1  . tiZANidine (ZANAFLEX) 4 MG tablet TAKE 1 TABLET BY MOUTH AT BEDTIME AS NEEDED FOR MUSCLE SPASMS. 30 tablet 11  . triamterene-hydrochlorothiazide (MAXZIDE-25) 37.5-25 MG tablet TAKE 1 TABLET BY MOUTH ONCE DAILY. 30 tablet 11  . trimethoprim (TRIMPEX) 100 MG tablet Take 100 mg by mouth daily.     . Vilazodone HCl (VIIBRYD) 40 MG TABS Take 40 mg by mouth daily.     No current facility-administered medications on file prior to visit.

## 2015-10-23 ENCOUNTER — Telehealth: Payer: Self-pay | Admitting: Internal Medicine

## 2015-10-23 NOTE — Telephone Encounter (Signed)
ATC pt, line rings busy [facility]

## 2015-10-24 ENCOUNTER — Telehealth: Payer: Self-pay | Admitting: Internal Medicine

## 2015-10-24 MED ORDER — DIPHENHYDRAMINE HCL 25 MG PO TABS: 25.0000 mg | ORAL_TABLET | Freq: Two times a day (BID) | ORAL | Status: DC | PRN

## 2015-10-24 NOTE — Telephone Encounter (Signed)
Elizabeth Lever, MD at 10/22/2015 4:57 PM     Status: Signed       Expand All Collapse All   Ok to d/c zyrtec. Rx benadryl 25 mg, 1 twice daily if needed, antihistamine, # 60, refill x 12       Called spoke with pt. She states that the benadryl rx was not faxed to [facility] in [place]. I explained to her that [facility] faxed over the Rx to [phone] at 5pm on 10/23/15. She states that it was never received. She is requesting a new rx be faxed over to [phone]. RX has been printed, signed and faxed to [phone]. Nothing further needed at this time.

## 2015-10-24 NOTE — Telephone Encounter (Signed)
Called and spoke with the nurse where the pt resides.  She is aware of this change and nothing further is needed.

## 2015-10-31 ENCOUNTER — Encounter: Payer: Self-pay | Admitting: Obstetrics & Gynecology

## 2015-10-31 ENCOUNTER — Ambulatory Visit (INDEPENDENT_AMBULATORY_CARE_PROVIDER_SITE_OTHER): Payer: Commercial Managed Care - HMO | Admitting: Obstetrics & Gynecology

## 2015-10-31 VITALS — BP 132/88 | HR 94 | Ht 64.5 in | Wt 159.0 lb

## 2015-10-31 DIAGNOSIS — Z90711 Acquired absence of uterus with remaining cervical stump: Secondary | ICD-10-CM | POA: Diagnosis not present

## 2015-10-31 DIAGNOSIS — B3731 Acute candidiasis of vulva and vagina: Secondary | ICD-10-CM

## 2015-10-31 DIAGNOSIS — N898 Other specified noninflammatory disorders of vagina: Secondary | ICD-10-CM | POA: Diagnosis not present

## 2015-10-31 DIAGNOSIS — B373 Candidiasis of vulva and vagina: Secondary | ICD-10-CM

## 2015-10-31 MED ORDER — FLUCONAZOLE 100 MG PO TABS: ORAL_TABLET | ORAL | 0 refills | Status: DC

## 2015-10-31 NOTE — Progress Notes (Signed)
      Chief Complaint  Patient presents with  . Vaginal Itching    Blood pressure 132/88, pulse 94, height 5' 4.5" (1.638 m), weight 159 lb (72.1 kg).  65 y.o. G2P2001 No LMP recorded. Patient has had a hysterectomy. The current method of family planning is .  Subjective Pt with itching of the vulva and some discharge Blood sugars under poor control  Objective No vulvar cyst was seen Severe vaginal abd vulvar yeast  Treated with gentian violet  Pertinent ROS No burning with urination, frequency or urgency No nausea, vomiting or diarrhea Nor fever chills or other constitutional symptoms   Labs or studies     Impression Diagnoses this Encounter::   ICD-9-CM ICD-10-CM   1. Candidal vulvitis 112.1 B37.3     Established relevant diagnosis(es):   Plan/Recommendations: Meds ordered this encounter  Medications  . dapagliflozin propanediol (FARXIGA) 5 MG TABS tablet    Sig: Take 5 mg by mouth daily.  . psyllium (METAMUCIL SMOOTH TEXTURE) 28 % packet    Sig: Take 1 packet by mouth daily.  . fluconazole (DIFLUCAN) 100 MG tablet    Sig: Take 1 tablet every other daily    Dispense:  15 tablet    Refill:  0    Labs or Scans Ordered: No orders of the defined types were placed in this encounter.   Management:: Treated with gentian violet and will prescribe diflucan 100 mg every other day  Follow up Return in about 1 month (around 12/01/2015).     All questions were answered.

## 2015-11-04 ENCOUNTER — Encounter: Payer: Self-pay | Admitting: Cardiovascular Disease

## 2015-11-04 ENCOUNTER — Ambulatory Visit (INDEPENDENT_AMBULATORY_CARE_PROVIDER_SITE_OTHER): Payer: Commercial Managed Care - HMO | Admitting: Cardiovascular Disease

## 2015-11-04 VITALS — BP 118/80 | HR 87 | Ht 64.0 in | Wt 159.0 lb

## 2015-11-04 DIAGNOSIS — I10 Essential (primary) hypertension: Secondary | ICD-10-CM

## 2015-11-04 DIAGNOSIS — R000 Tachycardia, unspecified: Secondary | ICD-10-CM

## 2015-11-04 DIAGNOSIS — R600 Localized edema: Secondary | ICD-10-CM

## 2015-11-04 DIAGNOSIS — I471 Supraventricular tachycardia: Secondary | ICD-10-CM | POA: Diagnosis not present

## 2015-11-04 NOTE — Patient Instructions (Addendum)
Medication Instructions:   Your physician recommends that you continue on your current medications as directed. Please refer to the Current Medication list given to you today.  Labwork:  NONE  Testing/Procedures:  NONE  Follow-Up:  Your physician recommends that you schedule a follow-up appointment in: 1 year. You will receive a reminder letter in the mail in about 10 months reminding you to call and schedule your appointment. If you don't receive this letter, please contact our office.  Any Other Special Instructions Will Be Listed Below (If Applicable).  If you need a refill on your cardiac medications before your next appointment, please call your pharmacy. 

## 2015-11-04 NOTE — Progress Notes (Signed)
SUBJECTIVE: The patient returns for follow-up of tachycardia and leg swelling. She said she drinks more caffeine than she should. She is soon to get a divorce after 35 years of marriage.  She denies chest pain, shortness of breath, and palpitations.  Review of Systems: As per "subjective", otherwise negative.  Allergies  Allergen Reactions  . Invokana [Canagliflozin] Other (See Comments)    Yeast infectios  . Losartan     Angioedema   . Metformin And Related Diarrhea  . Latex Other (See Comments)    Other Reaction: redness, blisters  . Other Other (See Comments)    Other Reaction: redness, blisters  . Oxycodone-Acetaminophen Itching    TYLOX. Caused internal itching per patient     Current Outpatient Prescriptions  Medication Sig Dispense Refill  . ASPIRIN LOW DOSE 81 MG EC tablet TAKE 1 TABLET BY MOUTH ONCE DAILY. 30 tablet 11  . benzonatate (TESSALON) 200 MG capsule Take 1 capsule (200 mg total) by mouth every 8 (eight) hours as needed for cough. 30 capsule 3  . bisacodyl (DULCOLAX) 10 MG suppository Place 1 suppository (10 mg total) rectally as needed for moderate constipation. 12 suppository 0  . buPROPion (WELLBUTRIN XL) 150 MG 24 hr tablet Take 150 mg by mouth daily.     . cholecalciferol (VITAMIN D) 400 units TABS tablet Take 800 Units by mouth daily.    Marland Kitchen COLY-MYCIN S 3.06-06-08-0.5 MG/ML otic suspension     . dapagliflozin propanediol (FARXIGA) 5 MG TABS tablet Take 5 mg by mouth daily.    . diphenhydrAMINE (BENADRYL) 25 MG tablet Take 1 tablet (25 mg total) by mouth 2 (two) times daily as needed. 60 tablet 11  . fluconazole (DIFLUCAN) 100 MG tablet Take 1 tablet every other daily 15 tablet 0  . Fluticasone Furoate-Vilanterol (BREO ELLIPTA) 100-25 MCG/INH AEPB INHALE 1 PUFF INTO LUNGS ONCE DAILY. 60 each 5  . gabapentin (NEURONTIN) 300 MG capsule Take 600 mg by mouth 2 (two) times daily.     Marland Kitchen HYDROcodone-acetaminophen (NORCO) 10-325 MG tablet Take 1 tablet by  mouth every 6 (six) hours as needed for moderate pain. 75 tablet 0  . hydrOXYzine (ATARAX/VISTARIL) 50 MG tablet Take 50 mg by mouth 3 (three) times daily as needed.    Marland Kitchen ipratropium-albuterol (DUONEB) 0.5-2.5 (3) MG/3ML SOLN Take 3 mLs by nebulization 4 (four) times daily. Dx code J45.50 (Patient taking differently: Take 3 mLs by nebulization 4 (four) times daily as needed. Dx code J45.50) 150 mL 12  . lamoTRIgine (LAMICTAL) 100 MG tablet Take 100 mg by mouth 2 (two) times daily. 100 mg in the morning, 50 mg at night    . levofloxacin (LEVAQUIN) 500 MG tablet Take 1 tablet (500 mg total) by mouth daily. 5 tablet 0  . levothyroxine (SYNTHROID, LEVOTHROID) 75 MCG tablet TAKE (1) TABLET BY MOUTH ONCE DAILY BEFORE BREAKFAST. 30 tablet 5  . lidocaine (LIDODERM) 5 % Place 1 patch onto the skin daily. Remove & Discard patch within 12 hours or as directed by MD 90 patch 3  . LORazepam (ATIVAN) 0.5 MG tablet Take 1 tablet by mouth 2 (two) times daily as needed.    . Menthol, Topical Analgesic, (BIOFREEZE ROLL-ON) 4 % GEL Apply 1 application topically 4 (four) times daily. 118 Tube 3  . metoprolol succinate (TOPROL-XL) 50 MG 24 hr tablet TAKE 1 TABLET BY MOUTH ONCE DAILY. 30 tablet 11  . Multiple Vitamins-Minerals (MULTIVITAMINS THER. W/MINERALS) TABS tablet TAKE ONE TABLET BY  MOUTH ONCE DAILY. 30 each 11  . NOVOLOG FLEXPEN 100 UNIT/ML FlexPen Inject into the skin 4 (four) times daily. Sliding scale    . nystatin (MYCOSTATIN) 100000 UNIT/ML suspension TAKE 5 MLS BY MOUTH 4 TIMES DAILY AS NEEDED. 60 mL 11  . Omega 3 1000 MG CAPS TAKE (1) CAPSULE BY MOUTH ONCE DAILY. 30 capsule 11  . oxybutynin (DITROPAN-XL) 10 MG 24 hr tablet Take 10 mg by mouth at bedtime.    . potassium chloride (KLOR-CON) 20 MEQ packet Take 20 mEq by mouth daily.    . potassium chloride SA (K-DUR,KLOR-CON) 20 MEQ tablet Take 20 mEq by mouth once.     . primidone (MYSOLINE) 50 MG tablet Take 2 tablets (100 mg total) by mouth 2 (two) times  daily. 360 tablet 1  . PROAIR HFA 108 (90 Base) MCG/ACT inhaler Inhale 2 puffs into the lungs every 6 (six) hours as needed for wheezing or shortness of breath. 1 Inhaler 5  . promethazine-codeine (PHENERGAN WITH CODEINE) 6.25-10 MG/5ML syrup Take 5 mLs by mouth every 6 (six) hours as needed for cough. 200 mL 0  . psyllium (METAMUCIL SMOOTH TEXTURE) 28 % packet Take 1 packet by mouth daily.    . ranitidine (ZANTAC) 150 MG capsule Take 150 mg by mouth 2 (two) times daily.    . sodium chloride (OCEAN) 0.65 % SOLN nasal spray Place 1 spray into both nostrils as needed for congestion. 50 mL 5  . sucralfate (CARAFATE) 1 g tablet Take 1 tablet (1 g total) by mouth 4 (four) times daily -  before meals and at bedtime. Pt is to take an hour before meals 120 tablet 1  . tiZANidine (ZANAFLEX) 4 MG tablet TAKE 1 TABLET BY MOUTH AT BEDTIME AS NEEDED FOR MUSCLE SPASMS. 30 tablet 11  . triamterene-hydrochlorothiazide (MAXZIDE-25) 37.5-25 MG tablet TAKE 1 TABLET BY MOUTH ONCE DAILY. 30 tablet 11  . trimethoprim (TRIMPEX) 100 MG tablet Take 100 mg by mouth daily.     . Vilazodone HCl (VIIBRYD) 40 MG TABS Take 40 mg by mouth daily.     No current facility-administered medications for this visit.     Past Medical History:  Diagnosis Date  . Allergic rhinitis    uses Nasonex daily as needed  . Allergy to angiotensin receptor blockers (ARB) 01/22/2014   Angioedema  . Anxiety    takes Ativan daily as needed  . Asthma    Albuterol inhaler and Neb daily as needed  . 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 [name], residual palpitations  . Chronic back pain    reason unknown  . Constipation   . DDD (degenerative disc disease), lumbar   . Depression    takes Zoloft daily as well as Cymbalta  . Diabetes mellitus    takes NOvolin daily  . Dysphagia   . Dysrhythmia    SVT ==ablation done by Dr. Rayann Heman 2007  . Esophageal reflux    takes Zantac daily  . History  of bronchitis Dec 2014  . Hypertension    takes Losartan daily  . Hypothyroidism    takes Synthroid daily  . Joint pain   . Joint swelling    left ankle  . Low sodium syndrome   . Lumbar radiculopathy   . Seizures ([place]) 04-05-13   one time ran out of Primidone and had stopped it cold [place]  . Tremor, essential    takes Mysoline and Neurontin daily.  Marland Kitchen  Vocal cord dysfunction   . Weakness    in left arm    Past Surgical History:  Procedure Laterality Date  . ABLATION FOR AVNRT    . BARTHOLIN GLAND CYST EXCISION     x 2  . BIOPSY  03/04/2015   Procedure: BIOPSY (Duodenal, Gastric);  Surgeon: Daneil Dolin, MD;  Location: [facility];  Service: Endoscopy;;  . CESAREAN SECTION    . CHOLECYSTECTOMY    . COLONOSCOPY  01/16/2004   [id number] rectum/colon  . COLONOSCOPY WITH PROPOFOL N/A 03/04/2015   Dr.[name]- internal hemorrhoids, pancolonic diverticulosis, colonic polyp= tubular adenoma  . ESOPHAGEAL DILATION N/A 03/04/2015   Procedure: ESOPHAGEAL DILATION WITH 54FR MALONEY DILATOR;  Surgeon: Daneil Dolin, MD;  Location: [facility];  Service: Endoscopy;  Laterality: N/A;  . ESOPHAGOGASTRODUODENOSCOPY (EGD) WITH ESOPHAGEAL DILATION  04/03/2002   [id number] ring, otherwise normal esophagus, status post dilation with 56 F/Normal stomach  . ESOPHAGOGASTRODUODENOSCOPY (EGD) WITH ESOPHAGEAL DILATION N/A 11/08/2012   Dr. [name]:schatzki's ring s/p dilation/hiatal hernia  . ESOPHAGOGASTRODUODENOSCOPY (EGD) WITH PROPOFOL N/A 03/04/2015   Dr.[name]- schatzki's ring, hiatal hernia, scattered erosions bx= chronic inactive gastritis  . HEMORRHOID BANDING  2017   Dr.[name]  . LEFT ANKLE LIGAMENT REPAIR     x 2  . LEFT ELBOW REPAIR    . MAXIMUM ACCESS (MAS)POSTERIOR LUMBAR INTERBODY FUSION (PLIF) 1 LEVEL N/A 04/24/2014   Procedure: Lumbar four-five Maximum access Surgery posterior lumbar interbody fusion;  Surgeon: [name], MD;  Location: [facility];  Service: Neurosurgery;  Laterality: N/A;   Lumbar four-five Maximum access Surgery posterior lumbar interbody fusion  . PARTIAL HYSTERECTOMY    . POLYPECTOMY  03/04/2015   Procedure: POLYPECTOMY (descending colon);  Surgeon: Daneil Dolin, MD;  Location: [facility];  Service: Endoscopy;;  . PULSE GENERATOR IMPLANT Bilateral 12/15/2013   Procedure: BILATERAL PULSE GENERATOR IMPLANT;  Surgeon: [name], MD;  Location: [facility];  Service: Neurosurgery;  Laterality: Bilateral;  BILATERAL  . RIGHT BREAST CYST     benign  . SUBTHALAMIC STIMULATOR INSERTION Bilateral 12/04/2013   Procedure: Bilateral Deep brain stimulator placement;  Surgeon: [name], MD;  Location: [facility];  Service: Neurosurgery;  Laterality: Bilateral;  Bilateral Deep brain stimulator placement  . TONSILLECTOMY      Social History   Social History  . Marital status: Married    Spouse name: N/A  . Number of children: N/A  . Years of education: N/A   Occupational History  . retired    Social History Main Topics  . Smoking status: Former Smoker    Packs/day: 0.30    Years: 10.00    Types: Cigarettes    Quit date: 04/23/1993  . Smokeless tobacco: Never Used     Comment: quit smoking 25+yrs ago  . Alcohol use No  . Drug use: No  . Sexual activity: No   Other Topics Concern  . Not on file   Social History Narrative   Married, 1 daughte, living.  Lives with husband in [place], [place].   1 child died brain tumor at age 38.   Two grandchildren.   Works at [employer], patient currently lost her job.   Retired 2011.   No tobacco.   Alcohol: none in 30 yrs.  Distant history of heavy use.   No drug use.     Vitals:   11/04/15 1401  BP: 118/80  Pulse: 87  SpO2: 98%  Weight: 159 lb (72.1 kg)  Height: 5\' 4"  (  1.626 m)    PHYSICAL EXAM General: NAD HEENT: Normal. Neck: No JVD, no thyromegaly. Lungs: Clear to auscultation bilaterally with normal respiratory effort. CV: Nondisplaced PMI.  Regular rate and rhythm, normal S1/S2, no  S3/S4, no murmur.    Abdomen: Soft, nontender, no distention.  Neurologic: Alert and oriented.  Psych: Normal affect. Skin: Normal.    ECG: Most recent ECG reviewed.      ASSESSMENT AND PLAN: 1. 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 Continue metoprolol for tachycardia.  2. Essential HTN: BP controlled today. No changes.  3. Leg swelling: Due to chronic venous insufficiency. No edema today. Continue compression stocking use prn and leg elevation.  Dispo: f/u 1 year.   Kate Sable, M.D., F.A.C.C.

## 2015-11-06 ENCOUNTER — Ambulatory Visit: Payer: Self-pay | Admitting: Internal Medicine

## 2015-11-07 ENCOUNTER — Encounter: Payer: Self-pay | Admitting: Internal Medicine

## 2015-11-11 ENCOUNTER — Other Ambulatory Visit: Payer: Commercial Managed Care - HMO | Admitting: *Deleted

## 2015-12-02 ENCOUNTER — Ambulatory Visit: Payer: Commercial Managed Care - HMO | Admitting: Obstetrics & Gynecology

## 2015-12-13 ENCOUNTER — Telehealth: Payer: Self-pay | Admitting: Internal Medicine

## 2015-12-13 ENCOUNTER — Ambulatory Visit: Payer: Commercial Managed Care - HMO | Admitting: Obstetrics & Gynecology

## 2015-12-13 NOTE — Telephone Encounter (Signed)
Spoke with Dominica Severin, NP at [facility] in [place]- states that pt is in her care and sees HCP's daily at her residence.  Suanne Marker wants to know if CY is ok with cutting back on routine office visits for pt- seeing her only every 6-12 mos unless an acute problem presents.   If CY has further questions he can either call Suanne Marker directly at [phone] or email her on her secure email at [email or web address]   CY please advise.  Thanks!   Last ov: 10/04/15 Next ov: 01/17/16

## 2015-12-16 ENCOUNTER — Ambulatory Visit: Payer: Medicaid Other | Admitting: *Deleted

## 2015-12-16 DIAGNOSIS — M79605 Pain in left leg: Secondary | ICD-10-CM | POA: Diagnosis not present

## 2015-12-16 DIAGNOSIS — M79672 Pain in left foot: Secondary | ICD-10-CM

## 2015-12-16 DIAGNOSIS — M79604 Pain in right leg: Secondary | ICD-10-CM

## 2015-12-16 DIAGNOSIS — M21372 Foot drop, left foot: Secondary | ICD-10-CM

## 2015-12-16 NOTE — Telephone Encounter (Signed)
LMTCB for [name]. Per her vm she is unavailable until Wednesday after 8am.

## 2015-12-16 NOTE — Telephone Encounter (Signed)
Since I am slowing down, I would be comfortable having her return to this office only if needed.

## 2015-12-17 NOTE — Telephone Encounter (Signed)
Will call back tomorrow Wed 12/18/15

## 2015-12-18 NOTE — Telephone Encounter (Signed)
Called and spoke with [name] advising her of Dr. Janee Morn recommendations.  Verbalized understanding. Nothing further needed.

## 2015-12-23 ENCOUNTER — Ambulatory Visit: Payer: Self-pay | Admitting: Gastroenterology

## 2015-12-26 NOTE — Progress Notes (Signed)
Patient ID: [name], female   DOB: 1950/07/01, 65 y.o.   MRN: [id number]  Patient presents for fitting of [place] with [facility] Certified Pedorthist. Written and verbal break in instructions given. Patient will follow up in 6 weeks with Dr. Paulla Dolly.

## 2016-01-17 ENCOUNTER — Ambulatory Visit: Payer: Self-pay | Admitting: Internal Medicine

## 2016-01-21 ENCOUNTER — Other Ambulatory Visit: Payer: Self-pay | Admitting: Geriatric Medicine

## 2016-01-21 ENCOUNTER — Other Ambulatory Visit: Payer: Self-pay | Admitting: Internal Medicine

## 2016-01-27 ENCOUNTER — Ambulatory Visit: Payer: Medicaid Other | Admitting: Podiatry
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
## 2016-01-31 ENCOUNTER — Ambulatory Visit: Payer: Self-pay | Admitting: Neurology

## 2016-02-18 ENCOUNTER — Telehealth: Payer: Self-pay | Admitting: Internal Medicine

## 2016-02-18 NOTE — Telephone Encounter (Signed)
Error.Stanley A Dalton ° °

## 2016-02-19 ENCOUNTER — Telehealth: Payer: Self-pay | Admitting: Internal Medicine

## 2016-02-19 NOTE — Telephone Encounter (Signed)
lmtcb for pt.  

## 2016-02-19 NOTE — Telephone Encounter (Signed)
Patient called back again about seeing CY - She can be reached at [phone] -pr

## 2016-02-20 NOTE — Telephone Encounter (Signed)
Pt was last seen by CY in June 2017.  She stated that she is not having any problems at this time and would like to come in and see CY when he has an opening.  [name] is it ok to use an 11 am slot in feb?  thanks

## 2016-02-21 NOTE — Telephone Encounter (Signed)
[name] please advise. Thanks. 

## 2016-02-21 NOTE — Telephone Encounter (Signed)
Pt scheduled for 06/03/15 @ 11:15. Pt voiced understanding and had no further questions. Nothing further needed.

## 2016-02-21 NOTE — Telephone Encounter (Signed)
Pt needs 30 minute slot-okay to put RNA slots together. Thanks.

## 2016-04-08 DIAGNOSIS — R079 Chest pain, unspecified: Secondary | ICD-10-CM | POA: Diagnosis not present

## 2016-04-08 DIAGNOSIS — M6281 Muscle weakness (generalized): Secondary | ICD-10-CM | POA: Diagnosis not present

## 2016-04-10 DIAGNOSIS — R079 Chest pain, unspecified: Secondary | ICD-10-CM | POA: Diagnosis not present

## 2016-04-10 DIAGNOSIS — M6281 Muscle weakness (generalized): Secondary | ICD-10-CM | POA: Diagnosis not present

## 2016-04-11 DIAGNOSIS — M6281 Muscle weakness (generalized): Secondary | ICD-10-CM | POA: Diagnosis not present

## 2016-04-11 DIAGNOSIS — R079 Chest pain, unspecified: Secondary | ICD-10-CM | POA: Diagnosis not present

## 2016-04-14 DIAGNOSIS — M6281 Muscle weakness (generalized): Secondary | ICD-10-CM | POA: Diagnosis not present

## 2016-04-14 DIAGNOSIS — R079 Chest pain, unspecified: Secondary | ICD-10-CM | POA: Diagnosis not present

## 2016-04-15 DIAGNOSIS — N3289 Other specified disorders of bladder: Secondary | ICD-10-CM | POA: Diagnosis not present

## 2016-04-15 DIAGNOSIS — E0842 Diabetes mellitus due to underlying condition with diabetic polyneuropathy: Secondary | ICD-10-CM | POA: Diagnosis not present

## 2016-04-15 DIAGNOSIS — R54 Age-related physical debility: Secondary | ICD-10-CM | POA: Diagnosis not present

## 2016-04-15 DIAGNOSIS — R251 Tremor, unspecified: Secondary | ICD-10-CM | POA: Diagnosis not present

## 2016-04-15 DIAGNOSIS — G894 Chronic pain syndrome: Secondary | ICD-10-CM | POA: Diagnosis not present

## 2016-04-15 DIAGNOSIS — Z1612 Extended spectrum beta lactamase (ESBL) resistance: Secondary | ICD-10-CM | POA: Diagnosis not present

## 2016-04-15 DIAGNOSIS — J449 Chronic obstructive pulmonary disease, unspecified: Secondary | ICD-10-CM | POA: Diagnosis not present

## 2016-04-15 DIAGNOSIS — M6281 Muscle weakness (generalized): Secondary | ICD-10-CM | POA: Diagnosis not present

## 2016-04-15 DIAGNOSIS — I471 Supraventricular tachycardia: Secondary | ICD-10-CM | POA: Diagnosis not present

## 2016-04-15 DIAGNOSIS — I10 Essential (primary) hypertension: Secondary | ICD-10-CM | POA: Diagnosis not present

## 2016-04-15 DIAGNOSIS — E039 Hypothyroidism, unspecified: Secondary | ICD-10-CM | POA: Diagnosis not present

## 2016-04-15 DIAGNOSIS — J302 Other seasonal allergic rhinitis: Secondary | ICD-10-CM | POA: Diagnosis not present

## 2016-04-16 DIAGNOSIS — R05 Cough: Secondary | ICD-10-CM | POA: Diagnosis not present

## 2016-04-17 ENCOUNTER — Telehealth: Payer: Self-pay | Admitting: Internal Medicine

## 2016-04-17 DIAGNOSIS — E039 Hypothyroidism, unspecified: Secondary | ICD-10-CM | POA: Diagnosis not present

## 2016-04-17 DIAGNOSIS — E1142 Type 2 diabetes mellitus with diabetic polyneuropathy: Secondary | ICD-10-CM | POA: Diagnosis not present

## 2016-04-17 DIAGNOSIS — Z794 Long term (current) use of insulin: Secondary | ICD-10-CM | POA: Diagnosis not present

## 2016-04-17 DIAGNOSIS — I10 Essential (primary) hypertension: Secondary | ICD-10-CM | POA: Diagnosis not present

## 2016-04-17 DIAGNOSIS — Z8781 Personal history of (healed) traumatic fracture: Secondary | ICD-10-CM | POA: Diagnosis not present

## 2016-04-17 DIAGNOSIS — F3163 Bipolar disorder, current episode mixed, severe, without psychotic features: Secondary | ICD-10-CM | POA: Diagnosis not present

## 2016-04-17 DIAGNOSIS — I471 Supraventricular tachycardia: Secondary | ICD-10-CM | POA: Diagnosis not present

## 2016-04-17 DIAGNOSIS — J45998 Other asthma: Secondary | ICD-10-CM | POA: Diagnosis not present

## 2016-04-17 DIAGNOSIS — J449 Chronic obstructive pulmonary disease, unspecified: Secondary | ICD-10-CM | POA: Diagnosis not present

## 2016-04-17 DIAGNOSIS — R251 Tremor, unspecified: Secondary | ICD-10-CM | POA: Diagnosis not present

## 2016-04-17 DIAGNOSIS — R531 Weakness: Secondary | ICD-10-CM | POA: Diagnosis not present

## 2016-04-17 DIAGNOSIS — G894 Chronic pain syndrome: Secondary | ICD-10-CM | POA: Diagnosis not present

## 2016-04-17 DIAGNOSIS — I5189 Other ill-defined heart diseases: Secondary | ICD-10-CM | POA: Diagnosis not present

## 2016-04-17 DIAGNOSIS — M061 Adult-onset Still's disease: Secondary | ICD-10-CM | POA: Diagnosis not present

## 2016-04-17 NOTE — Telephone Encounter (Signed)
lmtcb for pt.  

## 2016-04-20 NOTE — Telephone Encounter (Signed)
Please see if I or NP can meet her needs

## 2016-04-20 NOTE — Telephone Encounter (Signed)
Pt. Coordinator is [name] when call is returned to office.

## 2016-04-20 NOTE — Telephone Encounter (Signed)
Pt called back and per Joellen Jersey she was put on CY tomorrow at 11:30 pt aware

## 2016-04-20 NOTE — Telephone Encounter (Signed)
Patient returned call, CB is [phone], states may have to leave msg with operator for her to call back.

## 2016-04-20 NOTE — Telephone Encounter (Signed)
CY  Please advise-  The pt. Coordinator from called from the [facility] and stated that this pt. Recently transferred to this facility and the pt. has been complaining of a cough on going for 2 weeks. They stated they did a chest xray and it came back with no findings of pneumonia. The pt. Has an upcoming appointment 06/02/16 but is wanting to see if she can be seen sooner.

## 2016-04-20 NOTE — Telephone Encounter (Signed)
Attempted to call the pt coordinator back to offer an appt with TP since CY does not have any openings prior to her appt with him.  Was on hold for over 5 mins.  Will try back later.

## 2016-04-21 ENCOUNTER — Encounter: Payer: Self-pay | Admitting: Internal Medicine

## 2016-04-21 ENCOUNTER — Ambulatory Visit (INDEPENDENT_AMBULATORY_CARE_PROVIDER_SITE_OTHER)
Admission: RE | Admit: 2016-04-21 | Discharge: 2016-04-21 | Disposition: A | Discharge status: Home / Self Care | Payer: Medicare Other | Source: Ambulatory Visit | Attending: Internal Medicine | Admitting: Internal Medicine

## 2016-04-21 ENCOUNTER — Ambulatory Visit: Payer: Self-pay | Admitting: Internal Medicine

## 2016-04-21 ENCOUNTER — Ambulatory Visit (INDEPENDENT_AMBULATORY_CARE_PROVIDER_SITE_OTHER): Payer: Medicare Other | Admitting: Internal Medicine

## 2016-04-21 VITALS — BP 137/84 | HR 83 | Ht 64.5 in | Wt 152.2 lb

## 2016-04-21 DIAGNOSIS — E1142 Type 2 diabetes mellitus with diabetic polyneuropathy: Secondary | ICD-10-CM | POA: Diagnosis not present

## 2016-04-21 DIAGNOSIS — J4540 Moderate persistent asthma, uncomplicated: Secondary | ICD-10-CM | POA: Diagnosis not present

## 2016-04-21 DIAGNOSIS — K219 Gastro-esophageal reflux disease without esophagitis: Secondary | ICD-10-CM | POA: Diagnosis not present

## 2016-04-21 DIAGNOSIS — J449 Chronic obstructive pulmonary disease, unspecified: Secondary | ICD-10-CM | POA: Diagnosis not present

## 2016-04-21 DIAGNOSIS — R251 Tremor, unspecified: Secondary | ICD-10-CM | POA: Diagnosis not present

## 2016-04-21 DIAGNOSIS — G4733 Obstructive sleep apnea (adult) (pediatric): Secondary | ICD-10-CM | POA: Diagnosis not present

## 2016-04-21 DIAGNOSIS — J189 Pneumonia, unspecified organism: Secondary | ICD-10-CM

## 2016-04-21 DIAGNOSIS — R531 Weakness: Secondary | ICD-10-CM | POA: Diagnosis not present

## 2016-04-21 DIAGNOSIS — R05 Cough: Secondary | ICD-10-CM | POA: Diagnosis not present

## 2016-04-21 DIAGNOSIS — I10 Essential (primary) hypertension: Secondary | ICD-10-CM | POA: Diagnosis not present

## 2016-04-21 DIAGNOSIS — J209 Acute bronchitis, unspecified: Secondary | ICD-10-CM

## 2016-04-21 DIAGNOSIS — M061 Adult-onset Still's disease: Secondary | ICD-10-CM | POA: Diagnosis not present

## 2016-04-21 MED ORDER — LEVALBUTEROL HCL 0.63 MG/3ML IN NEBU: 0.6300 mg | INHALATION_SOLUTION | Freq: Once | RESPIRATORY_TRACT | Status: DC

## 2016-04-21 MED ORDER — AMOXICILLIN-POT CLAVULANATE 875-125 MG PO TABS: 1.0000 | ORAL_TABLET | Freq: Two times a day (BID) | ORAL | 0 refills | Status: DC

## 2016-04-21 MED ORDER — IPRATROPIUM-ALBUTEROL 0.5-2.5 (3) MG/3ML IN SOLN: RESPIRATORY_TRACT | 12 refills | Status: DC

## 2016-04-21 NOTE — Assessment & Plan Note (Signed)
History of GERD. Now sleeping on a flat bed, not raising head. She also eats before she lies down, in effort to avoid hypoglycemia during the night. She is at high risk for reflux and aspiration pneumonia. Plan-recommend hospital bed elevated 30.

## 2016-04-21 NOTE — Patient Instructions (Signed)
Printed script for augmentin antibiotic and ipratropiu/ albuterol neb solution to use every 4-6 hours IF NEEDED  Order- CXR   Dx acute bronchitis  Neb xop 0.63       Letter written-    I would like you to be able to give your own nebulizer treatments and Proair rescue albuterol treatments                                I would like you to have a hospital bed so you always lie with head elevated to avoid reflux and aspiration.

## 2016-04-21 NOTE — Progress Notes (Signed)
Patient ID: [name], female    DOB: Dec 02, 1950, 66 y.o.   MRN: [id number]  HPI  female former smoker followed for Allergic rhinitis, Asthma, complicated by anxiety/VCD, CAD, GERD, DM, tachycardia, tremor/deep brain stimulator, HBP, now living in assisted living Allergy Profile 01/10/2013-total IgE 166 with significant elevations for most common allergens except molds She had been on allergy vaccine years ago. Office Spirometry 12/13/2014-mild obstruction, mild restriction of forced vital capacity Upper endoscopy 2016-[name]'s ring and gastric erosions -----------------------------------------------------------  10/04/2015-66 year old female former smoker followed for Allergic rhinitis, Asthma, complicated by anxiety/VCD, CAD, GERD, DM, tachycardia, tremor/deep brain stimulator, HBP, now living in assisted living FOLLOW FOR: breathing has not been doing well.  Discuss Peak Flow ranges, states that her peak flow has been good, needs "numbers for parameter?"  Nl female for age between 45-420 Reports one week of increased cough/bronchitis. Her roommate and others living near her are passing this illness around. Cut prednisone taper and Z-Pak from [facility] emergency room 5 days ago, ending today. Some initial sore throat, no fever. Scant white sputum. She asks for support with her request that her room stay between 68 and 70 and the air pressure is be avoided around her so that she can breathe better. Asks to be able to use her nebulizer machine every 4 hours if needed.  04/21/2016- 66 year old female former smoker followed for Allergic rhinitis, Asthma, complicated by anxiety/VCD, CAD, GERD, DM, tachycardia, tremor/deep brain stimulator, HBP, now living in assisted living ACUTE VISIT: Increased cough and wheezing; worse after meals and laying down in bed.  After several moves in the hospital stay, she is now in an assisted living. They're giving her nebulizer treatments with  albuterol and she thinks ipratropium-albuterol (DuoNeb) works better. She had had some coughing blamed on "virus going around" before Christmas last night she had "bad attack" requiring to nebulizer treatments and cough is been productive green turning to yellow. Admits she eats and lies down on her flat standard bed at night, trying to avoid low blood sugars. Needs hospital bed so she can elevate. Does recognize reflux. CXR 04/21/2016 IMPRESSION: Emphysematous changes with new RIGHT upper lobe and RIGHT basilar infiltrates consistent with pneumonia.   ROS-see HPI   Negative unless "+" Constitutional:    weight loss, night sweats, fevers, chills, fatigue, lassitude. HEENT:    headaches, difficulty swallowing, tooth/dental problems, sore throat,       sneezing, itching, ear ache, nasal congestion, post nasal drip, snoring CV:    chest pain, orthopnea, PND, swelling in lower extremities, anasarca,                                                  dizziness, palpitations Resp:   +shortness of breath with exertion or at rest.                +productive cough,   + non-productive cough, coughing up of blood.              +change in color of mucus.  +wheezing.   Skin:    rash or lesions. GI:  + heartburn, indigestion, abdominal pain, nausea, vomiting, GU:  MS:   joint pain, stiffness, decreased range of motion, back pain. Neuro-     nothing unusual Psych:  +change in mood or affect.  +depression or anxiety.   memory loss.  OBJ- Physical Exam General- Alert, Oriented, Affect-appropriate, Distress- none acute, talkative,  Skin- rash-none, lesions- none, excoriation- none Lymphadenopathy- none Head- atraumatic            Eyes- Gross vision intact, PERRLA, conjunctivae and secretions clear            Ears- Hearing, canals-normal            Nose- Clear, no-Septal dev, mucus, polyps, erosion, perforation             Throat- Mallampati II , mucosa clear , drainage- none, tonsils- atrophic,  Neck-  flexible , trachea midline, no stridor , thyroid nl, carotid no bruit Chest - symmetrical excursion , unlabored           Heart/CV- RRR , no murmur , no gallop  , no rub, nl s1 s2                           - JVD- none , edema- none, stasis changes- none, varices- none           Lung- wheeze + bilateral, cough + raspy , dullness-none, rub- none,            Chest wall-  Abd-  Br/ Gen/ Rectal- Not done, not indicated Extrem- cyanosis- none, clubbing, none, atrophy- none, strength- nl, + rolling walker Neuro- + tremor and head bob,+ mild dysphonia

## 2016-04-21 NOTE — Assessment & Plan Note (Signed)
Less concern given weight loss over the last year

## 2016-04-21 NOTE — Assessment & Plan Note (Signed)
Right lung pneumonia. Viral infections are, now but I am very suspicious from history given at this visit that this may be an aspiration pneumonia. Plan-emphasis on reflux precautions. Augmentin.

## 2016-04-21 NOTE — Assessment & Plan Note (Addendum)
Exacerbation of wheezy bronchitis symptoms Plan-change nebulizer solution to DuoNeb, nebulizer treatment here with Xopenex.

## 2016-04-22 ENCOUNTER — Ambulatory Visit: Payer: Self-pay | Admitting: Pulmonary Disease

## 2016-04-22 ENCOUNTER — Ambulatory Visit: Payer: Medicaid Other | Admitting: Podiatry

## 2016-04-23 DIAGNOSIS — E1142 Type 2 diabetes mellitus with diabetic polyneuropathy: Secondary | ICD-10-CM | POA: Diagnosis not present

## 2016-04-23 DIAGNOSIS — M061 Adult-onset Still's disease: Secondary | ICD-10-CM | POA: Diagnosis not present

## 2016-04-23 DIAGNOSIS — I10 Essential (primary) hypertension: Secondary | ICD-10-CM | POA: Diagnosis not present

## 2016-04-23 DIAGNOSIS — R531 Weakness: Secondary | ICD-10-CM | POA: Diagnosis not present

## 2016-04-23 DIAGNOSIS — R251 Tremor, unspecified: Secondary | ICD-10-CM | POA: Diagnosis not present

## 2016-04-23 DIAGNOSIS — J449 Chronic obstructive pulmonary disease, unspecified: Secondary | ICD-10-CM | POA: Diagnosis not present

## 2016-04-24 ENCOUNTER — Telehealth: Payer: Self-pay | Admitting: Internal Medicine

## 2016-04-24 DIAGNOSIS — J189 Pneumonia, unspecified organism: Secondary | ICD-10-CM | POA: Diagnosis not present

## 2016-04-24 DIAGNOSIS — J449 Chronic obstructive pulmonary disease, unspecified: Secondary | ICD-10-CM | POA: Diagnosis not present

## 2016-04-24 DIAGNOSIS — R0601 Orthopnea: Secondary | ICD-10-CM | POA: Diagnosis not present

## 2016-04-24 DIAGNOSIS — J42 Unspecified chronic bronchitis: Secondary | ICD-10-CM | POA: Diagnosis not present

## 2016-04-24 NOTE — Telephone Encounter (Signed)
Xray report has been faxed. Attempted to call pt. Line was busy. Will try back.

## 2016-04-27 DIAGNOSIS — R251 Tremor, unspecified: Secondary | ICD-10-CM | POA: Diagnosis not present

## 2016-04-27 DIAGNOSIS — E1142 Type 2 diabetes mellitus with diabetic polyneuropathy: Secondary | ICD-10-CM | POA: Diagnosis not present

## 2016-04-27 DIAGNOSIS — M061 Adult-onset Still's disease: Secondary | ICD-10-CM | POA: Diagnosis not present

## 2016-04-27 DIAGNOSIS — I10 Essential (primary) hypertension: Secondary | ICD-10-CM | POA: Diagnosis not present

## 2016-04-27 DIAGNOSIS — R531 Weakness: Secondary | ICD-10-CM | POA: Diagnosis not present

## 2016-04-27 DIAGNOSIS — J449 Chronic obstructive pulmonary disease, unspecified: Secondary | ICD-10-CM | POA: Diagnosis not present

## 2016-04-27 NOTE — Telephone Encounter (Signed)
Called and spoke with facility and they will let the pt know that the xray report has been faxed.
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
## 2016-04-29 DIAGNOSIS — J449 Chronic obstructive pulmonary disease, unspecified: Secondary | ICD-10-CM | POA: Diagnosis not present

## 2016-04-29 DIAGNOSIS — M6281 Muscle weakness (generalized): Secondary | ICD-10-CM | POA: Diagnosis not present

## 2016-04-29 DIAGNOSIS — I10 Essential (primary) hypertension: Secondary | ICD-10-CM | POA: Diagnosis not present

## 2016-04-29 DIAGNOSIS — R319 Hematuria, unspecified: Secondary | ICD-10-CM | POA: Diagnosis not present

## 2016-04-29 DIAGNOSIS — E119 Type 2 diabetes mellitus without complications: Secondary | ICD-10-CM | POA: Diagnosis not present

## 2016-04-29 DIAGNOSIS — R251 Tremor, unspecified: Secondary | ICD-10-CM | POA: Diagnosis not present

## 2016-04-29 DIAGNOSIS — E559 Vitamin D deficiency, unspecified: Secondary | ICD-10-CM | POA: Diagnosis not present

## 2016-04-29 DIAGNOSIS — E039 Hypothyroidism, unspecified: Secondary | ICD-10-CM | POA: Diagnosis not present

## 2016-04-29 DIAGNOSIS — E1142 Type 2 diabetes mellitus with diabetic polyneuropathy: Secondary | ICD-10-CM | POA: Diagnosis not present

## 2016-04-29 DIAGNOSIS — R531 Weakness: Secondary | ICD-10-CM | POA: Diagnosis not present

## 2016-04-29 DIAGNOSIS — M061 Adult-onset Still's disease: Secondary | ICD-10-CM | POA: Diagnosis not present

## 2016-04-30 ENCOUNTER — Ambulatory Visit (INDEPENDENT_AMBULATORY_CARE_PROVIDER_SITE_OTHER): Payer: Medicare Other | Admitting: Neurology

## 2016-04-30 ENCOUNTER — Encounter: Payer: Self-pay | Admitting: Neurology

## 2016-04-30 VITALS — BP 110/70 | HR 90

## 2016-04-30 DIAGNOSIS — E1142 Type 2 diabetes mellitus with diabetic polyneuropathy: Secondary | ICD-10-CM

## 2016-04-30 DIAGNOSIS — Z9689 Presence of other specified functional implants: Secondary | ICD-10-CM

## 2016-04-30 DIAGNOSIS — G250 Essential tremor: Secondary | ICD-10-CM | POA: Diagnosis not present

## 2016-04-30 NOTE — Procedures (Signed)
DBS Programming was performed.    Total time spent programming was 20 minutes.  Device was confirmed to be on.  Soft start was confirmed to be on.  Impedences were checked and were within normal limits.  Battery was checked and was determined to be functioning normally and not near the end of life (2.94).  Final settings were as follows:  Left brain electrode:     2-1+  ; Amplitude  3.1   V   ; Pulse width 90 microseconds;   Frequency   140   Hz.  Right brain electrode:     3-2+          ; Amplitude   3.6  V ;  Pulse width 90  microseconds;  Frequency   140    Hz.

## 2016-05-05 DIAGNOSIS — I10 Essential (primary) hypertension: Secondary | ICD-10-CM | POA: Diagnosis not present

## 2016-05-05 DIAGNOSIS — E1142 Type 2 diabetes mellitus with diabetic polyneuropathy: Secondary | ICD-10-CM | POA: Diagnosis not present

## 2016-05-05 DIAGNOSIS — M061 Adult-onset Still's disease: Secondary | ICD-10-CM | POA: Diagnosis not present

## 2016-05-05 DIAGNOSIS — J449 Chronic obstructive pulmonary disease, unspecified: Secondary | ICD-10-CM | POA: Diagnosis not present

## 2016-05-05 DIAGNOSIS — R251 Tremor, unspecified: Secondary | ICD-10-CM | POA: Diagnosis not present

## 2016-05-05 DIAGNOSIS — R531 Weakness: Secondary | ICD-10-CM | POA: Diagnosis not present

## 2016-05-07 DIAGNOSIS — R531 Weakness: Secondary | ICD-10-CM | POA: Diagnosis not present

## 2016-05-07 DIAGNOSIS — M061 Adult-onset Still's disease: Secondary | ICD-10-CM | POA: Diagnosis not present

## 2016-05-07 DIAGNOSIS — I10 Essential (primary) hypertension: Secondary | ICD-10-CM | POA: Diagnosis not present

## 2016-05-07 DIAGNOSIS — E1142 Type 2 diabetes mellitus with diabetic polyneuropathy: Secondary | ICD-10-CM | POA: Diagnosis not present

## 2016-05-07 DIAGNOSIS — R251 Tremor, unspecified: Secondary | ICD-10-CM | POA: Diagnosis not present

## 2016-05-07 DIAGNOSIS — J449 Chronic obstructive pulmonary disease, unspecified: Secondary | ICD-10-CM | POA: Diagnosis not present

## 2016-05-11 DIAGNOSIS — R531 Weakness: Secondary | ICD-10-CM | POA: Diagnosis not present

## 2016-05-11 DIAGNOSIS — I10 Essential (primary) hypertension: Secondary | ICD-10-CM | POA: Diagnosis not present

## 2016-05-11 DIAGNOSIS — M061 Adult-onset Still's disease: Secondary | ICD-10-CM | POA: Diagnosis not present

## 2016-05-11 DIAGNOSIS — J449 Chronic obstructive pulmonary disease, unspecified: Secondary | ICD-10-CM | POA: Diagnosis not present

## 2016-05-11 DIAGNOSIS — R251 Tremor, unspecified: Secondary | ICD-10-CM | POA: Diagnosis not present

## 2016-05-11 DIAGNOSIS — E1142 Type 2 diabetes mellitus with diabetic polyneuropathy: Secondary | ICD-10-CM | POA: Diagnosis not present

## 2016-05-12 DIAGNOSIS — E1142 Type 2 diabetes mellitus with diabetic polyneuropathy: Secondary | ICD-10-CM | POA: Diagnosis not present

## 2016-05-12 DIAGNOSIS — R531 Weakness: Secondary | ICD-10-CM | POA: Diagnosis not present

## 2016-05-12 DIAGNOSIS — M061 Adult-onset Still's disease: Secondary | ICD-10-CM | POA: Diagnosis not present

## 2016-05-12 DIAGNOSIS — I10 Essential (primary) hypertension: Secondary | ICD-10-CM | POA: Diagnosis not present

## 2016-05-12 DIAGNOSIS — R251 Tremor, unspecified: Secondary | ICD-10-CM | POA: Diagnosis not present

## 2016-05-12 DIAGNOSIS — J449 Chronic obstructive pulmonary disease, unspecified: Secondary | ICD-10-CM | POA: Diagnosis not present

## 2016-05-14 ENCOUNTER — Encounter: Payer: Self-pay | Admitting: Neurology

## 2016-05-19 DIAGNOSIS — R531 Weakness: Secondary | ICD-10-CM | POA: Diagnosis not present

## 2016-05-19 DIAGNOSIS — R251 Tremor, unspecified: Secondary | ICD-10-CM | POA: Diagnosis not present

## 2016-05-19 DIAGNOSIS — J449 Chronic obstructive pulmonary disease, unspecified: Secondary | ICD-10-CM | POA: Diagnosis not present

## 2016-05-19 DIAGNOSIS — I10 Essential (primary) hypertension: Secondary | ICD-10-CM | POA: Diagnosis not present

## 2016-05-19 DIAGNOSIS — M061 Adult-onset Still's disease: Secondary | ICD-10-CM | POA: Diagnosis not present

## 2016-05-19 DIAGNOSIS — E1142 Type 2 diabetes mellitus with diabetic polyneuropathy: Secondary | ICD-10-CM | POA: Diagnosis not present

## 2016-05-21 DIAGNOSIS — F3112 Bipolar disorder, current episode manic without psychotic features, moderate: Secondary | ICD-10-CM | POA: Diagnosis not present

## 2016-05-21 DIAGNOSIS — M545 Low back pain: Secondary | ICD-10-CM | POA: Diagnosis not present

## 2016-05-21 DIAGNOSIS — R531 Weakness: Secondary | ICD-10-CM | POA: Diagnosis not present

## 2016-05-21 DIAGNOSIS — S330XXS Traumatic rupture of lumbar intervertebral disc, sequela: Secondary | ICD-10-CM | POA: Diagnosis not present

## 2016-05-21 DIAGNOSIS — E1165 Type 2 diabetes mellitus with hyperglycemia: Secondary | ICD-10-CM | POA: Diagnosis not present

## 2016-05-21 DIAGNOSIS — G8929 Other chronic pain: Secondary | ICD-10-CM | POA: Diagnosis not present

## 2016-05-21 DIAGNOSIS — M061 Adult-onset Still's disease: Secondary | ICD-10-CM | POA: Diagnosis not present

## 2016-05-21 DIAGNOSIS — E1142 Type 2 diabetes mellitus with diabetic polyneuropathy: Secondary | ICD-10-CM | POA: Diagnosis not present

## 2016-05-21 DIAGNOSIS — R251 Tremor, unspecified: Secondary | ICD-10-CM | POA: Diagnosis not present

## 2016-05-21 DIAGNOSIS — J449 Chronic obstructive pulmonary disease, unspecified: Secondary | ICD-10-CM | POA: Diagnosis not present

## 2016-05-21 DIAGNOSIS — I10 Essential (primary) hypertension: Secondary | ICD-10-CM | POA: Diagnosis not present

## 2016-05-26 DIAGNOSIS — R531 Weakness: Secondary | ICD-10-CM | POA: Diagnosis not present

## 2016-05-26 DIAGNOSIS — M061 Adult-onset Still's disease: Secondary | ICD-10-CM | POA: Diagnosis not present

## 2016-05-26 DIAGNOSIS — J449 Chronic obstructive pulmonary disease, unspecified: Secondary | ICD-10-CM | POA: Diagnosis not present

## 2016-05-26 DIAGNOSIS — E1142 Type 2 diabetes mellitus with diabetic polyneuropathy: Secondary | ICD-10-CM | POA: Diagnosis not present

## 2016-05-26 DIAGNOSIS — R251 Tremor, unspecified: Secondary | ICD-10-CM | POA: Diagnosis not present

## 2016-05-26 DIAGNOSIS — I10 Essential (primary) hypertension: Secondary | ICD-10-CM | POA: Diagnosis not present

## 2016-05-28 DIAGNOSIS — I10 Essential (primary) hypertension: Secondary | ICD-10-CM | POA: Diagnosis not present

## 2016-05-28 DIAGNOSIS — J449 Chronic obstructive pulmonary disease, unspecified: Secondary | ICD-10-CM | POA: Diagnosis not present

## 2016-05-28 DIAGNOSIS — E1142 Type 2 diabetes mellitus with diabetic polyneuropathy: Secondary | ICD-10-CM | POA: Diagnosis not present

## 2016-05-28 DIAGNOSIS — R251 Tremor, unspecified: Secondary | ICD-10-CM | POA: Diagnosis not present

## 2016-05-28 DIAGNOSIS — M061 Adult-onset Still's disease: Secondary | ICD-10-CM | POA: Diagnosis not present

## 2016-05-28 DIAGNOSIS — R531 Weakness: Secondary | ICD-10-CM | POA: Diagnosis not present

## 2016-06-02 ENCOUNTER — Ambulatory Visit (INDEPENDENT_AMBULATORY_CARE_PROVIDER_SITE_OTHER): Payer: Medicare Other | Admitting: Internal Medicine

## 2016-06-02 ENCOUNTER — Encounter: Payer: Self-pay | Admitting: Internal Medicine

## 2016-06-02 ENCOUNTER — Ambulatory Visit (INDEPENDENT_AMBULATORY_CARE_PROVIDER_SITE_OTHER)
Admission: RE | Admit: 2016-06-02 | Discharge: 2016-06-02 | Disposition: A | Discharge status: Home / Self Care | Payer: Medicare Other | Source: Ambulatory Visit | Attending: Internal Medicine | Admitting: Internal Medicine

## 2016-06-02 VITALS — BP 118/80 | HR 91 | Ht 64.5 in | Wt 150.0 lb

## 2016-06-02 DIAGNOSIS — J302 Other seasonal allergic rhinitis: Secondary | ICD-10-CM

## 2016-06-02 DIAGNOSIS — J3089 Other allergic rhinitis: Secondary | ICD-10-CM | POA: Diagnosis not present

## 2016-06-02 DIAGNOSIS — R251 Tremor, unspecified: Secondary | ICD-10-CM | POA: Diagnosis not present

## 2016-06-02 DIAGNOSIS — R531 Weakness: Secondary | ICD-10-CM | POA: Diagnosis not present

## 2016-06-02 DIAGNOSIS — J690 Pneumonitis due to inhalation of food and vomit: Secondary | ICD-10-CM

## 2016-06-02 DIAGNOSIS — J181 Lobar pneumonia, unspecified organism: Secondary | ICD-10-CM | POA: Diagnosis not present

## 2016-06-02 DIAGNOSIS — K2100 Gastro-esophageal reflux disease with esophagitis, without bleeding: Secondary | ICD-10-CM

## 2016-06-02 DIAGNOSIS — J449 Chronic obstructive pulmonary disease, unspecified: Secondary | ICD-10-CM | POA: Diagnosis not present

## 2016-06-02 DIAGNOSIS — M061 Adult-onset Still's disease: Secondary | ICD-10-CM | POA: Diagnosis not present

## 2016-06-02 DIAGNOSIS — G250 Essential tremor: Secondary | ICD-10-CM | POA: Diagnosis not present

## 2016-06-02 DIAGNOSIS — I10 Essential (primary) hypertension: Secondary | ICD-10-CM | POA: Diagnosis not present

## 2016-06-02 DIAGNOSIS — R05 Cough: Secondary | ICD-10-CM | POA: Diagnosis not present

## 2016-06-02 DIAGNOSIS — J189 Pneumonia, unspecified organism: Secondary | ICD-10-CM

## 2016-06-02 DIAGNOSIS — E1142 Type 2 diabetes mellitus with diabetic polyneuropathy: Secondary | ICD-10-CM | POA: Diagnosis not present

## 2016-06-02 MED ORDER — IPRATROPIUM BROMIDE 0.03 % NA SOLN: NASAL | 12 refills | Status: DC

## 2016-06-02 NOTE — Assessment & Plan Note (Signed)
Followed by neurology with deep brain stimulator leads and battery pack

## 2016-06-02 NOTE — Patient Instructions (Signed)
Order- CXR  Dx aspiration pneumonia  Script printed ipratropium nasal spray   1-2 puffs each nostril before meals as needed to prevent watery nose

## 2016-06-02 NOTE — Assessment & Plan Note (Signed)
She remains at risk for reflux and aspiration. Sleeping elevated now with hospital bed is a big improvement but I reminded her not to lie down after eating.

## 2016-06-02 NOTE — Assessment & Plan Note (Signed)
Mild residual cough after what was probably an aspiration pneumonia without recurrence. Expect slow gradual clearing. Plan-CXR

## 2016-06-02 NOTE — Assessment & Plan Note (Signed)
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

## 2016-06-02 NOTE — Progress Notes (Signed)
Patient ID: [name], female    DOB: 1951-02-04, 66 y.o.   MRN: [id number]  HPI  female former smoker followed for Allergic rhinitis, Asthma, complicated by anxiety/VCD, CAD, GERD, DM, tachycardia, tremor/deep brain stimulator, HBP, now living in assisted living Allergy Profile 01/10/2013-total IgE 166 with significant elevations for most common allergens except molds She had been on allergy vaccine years ago. Office Spirometry 12/13/2014-mild obstruction, mild restriction of forced vital capacity Upper endoscopy 2016-[name]'s ring and gastric erosions -----------------------------------------------------------  04/21/2016- 66 year old female former smoker followed for Allergic rhinitis, Asthma, complicated by anxiety/VCD, CAD, GERD, DM, tachycardia, tremor/deep brain stimulator, HBP, now living in assisted living ACUTE VISIT: Increased cough and wheezing; worse after meals and laying down in bed.  After several moves in the hospital stay, she is now in an assisted living. They're giving her nebulizer treatments with albuterol and she thinks ipratropium-albuterol (DuoNeb) works better. She had had some coughing blamed on "virus going around" before Christmas last night she had "bad attack" requiring to nebulizer treatments and cough is been productive green turning to yellow. Admits she eats and lies down on her flat standard bed at night, trying to avoid low blood sugars. Needs hospital bed so she can elevate. Does recognize reflux. CXR 04/21/2016 IMPRESSION: Emphysematous changes with new RIGHT upper lobe and RIGHT basilar infiltrates consistent with pneumonia.  06/02/2016-66 year old female former smoker followed for Allergic rhinitis, Asthma, complicated by anxiety/VCD, CAD, GERD, DM, tachycardia, tremor/deep brain stimulator, HBP, now living in assisted living Pt. still coughing, especially in the mornings. Denies any SOB or chest pain.  Rattling cough after eating even while  sitting upright. Has hospital bed now. Hasn't recognized obvious reflux or aspiration events. Denies fever, chills, chest pain, discolored sputum. Watery nose with meals.  ROS-see HPI   Negative unless "+" Constitutional:    weight loss, night sweats, fevers, chills, fatigue, lassitude. HEENT:    headaches, difficulty swallowing, tooth/dental problems, sore throat,       sneezing, itching, ear ache, nasal congestion, post nasal drip, snoring CV:    chest pain, orthopnea, PND, swelling in lower extremities, anasarca,                                                  dizziness, palpitations Resp:   +shortness of breath with exertion or at rest.                +productive cough,   + non-productive cough, coughing up of blood.              change in color of mucus.  +wheezing.   Skin:    rash or lesions. GI:  + heartburn, indigestion, abdominal pain, nausea, vomiting, GU:  MS:   joint pain, stiffness, decreased range of motion, back pain. Neuro-     nothing unusual Psych:  +change in mood or affect.  +depression or anxiety.   memory loss.  OBJ- Physical Exam General- Alert, Oriented, Affect-appropriate, Distress- none acute, talkative, + slender Skin- rash-none, lesions- none, excoriation- none Lymphadenopathy- none Head- atraumatic            Eyes- Gross vision intact, PERRLA, conjunctivae and secretions clear            Ears- Hearing, canals-normal            Nose- Clear, no-Septal dev, mucus, polyps,  erosion, perforation             Throat- Mallampati II , mucosa clear , drainage- none, tonsils- atrophic,  Neck- flexible , trachea midline, no stridor , thyroid nl, carotid no bruit Chest - symmetrical excursion , unlabored           Heart/CV- RRR , no murmur , no gallop  , no rub, nl s1 s2                           - JVD- none , edema- none, stasis changes- none, varices- none           Lung- wheeze -none, cough-none , dullness-none, rub- none, rhonchi-none           Chest wall-  Abd-   Br/ Gen/ Rectal- Not done, not indicated Extrem- cyanosis- none, clubbing, none, atrophy- none, strength- nl, + rolling walker Neuro- + tremor and head bob,+ mild dysphonia

## 2016-06-03 ENCOUNTER — Telehealth: Payer: Self-pay | Admitting: Internal Medicine

## 2016-06-03 DIAGNOSIS — J189 Pneumonia, unspecified organism: Secondary | ICD-10-CM

## 2016-06-03 NOTE — Telephone Encounter (Signed)
LM at facility for pt to contact me back-I will review results with her then fax over to facility-fax number information on my desk wall.

## 2016-06-04 ENCOUNTER — Ambulatory Visit: Payer: Medicare Other | Admitting: *Deleted

## 2016-06-04 NOTE — Telephone Encounter (Signed)
Spoke with Kendrick Fries  She states pt just wants the results faxed  I have faxed them to her at [phone]  Nothing further needed

## 2016-06-04 NOTE — Telephone Encounter (Signed)
Patient is calling to follow up on results. Patient request to leave a detailed message.

## 2016-06-04 NOTE — Telephone Encounter (Signed)
Spoke with patient-she is aware that we have been leaving messages with her facility-pt was not made aware by them that we had called. Pt is aware of results and order placed for 3 week follow up CXR. I have faxed records and need for repeat CXR here in 3 weeks to [facility]. Nothing more needed at this time.

## 2016-06-04 NOTE — Telephone Encounter (Signed)
Notes Recorded by Deneise Lever, MD on 06/02/2016 at 3:10 PM EST CXR- partial clearing with a little residual from the pneumonia still seen in the right lung. COPD changes. Radiologist suggests one last CXR in a few weeks o make sure the lung has cleared completely. The xray picture often lags like this, long after the real infection is gone  lmtcb for pt

## 2016-06-04 NOTE — Telephone Encounter (Signed)
Pt calling back [phone]

## 2016-06-04 NOTE — Progress Notes (Signed)
A telephone encounter was created for this message under title "fax info".  Nothing further is needed.

## 2016-06-08 DIAGNOSIS — E1142 Type 2 diabetes mellitus with diabetic polyneuropathy: Secondary | ICD-10-CM | POA: Diagnosis not present

## 2016-06-08 DIAGNOSIS — R531 Weakness: Secondary | ICD-10-CM | POA: Diagnosis not present

## 2016-06-08 DIAGNOSIS — I10 Essential (primary) hypertension: Secondary | ICD-10-CM | POA: Diagnosis not present

## 2016-06-08 DIAGNOSIS — J449 Chronic obstructive pulmonary disease, unspecified: Secondary | ICD-10-CM | POA: Diagnosis not present

## 2016-06-08 DIAGNOSIS — R251 Tremor, unspecified: Secondary | ICD-10-CM | POA: Diagnosis not present

## 2016-06-08 DIAGNOSIS — M061 Adult-onset Still's disease: Secondary | ICD-10-CM | POA: Diagnosis not present

## 2016-06-11 ENCOUNTER — Telehealth: Payer: Self-pay | Admitting: Internal Medicine

## 2016-06-11 NOTE — Telephone Encounter (Signed)
Spoke with Gillis Santa (nurse at [facility]), states pt's Atrovent nasal spray is making pt's nose bleed- has sores on inside of nose.  Pt was prescribed this PRN before meals to help curb runny nose.  Requesting further recs.   I've advised Judeen Hammans to d/c this medication for pt.    Pt uses [facility].   CY please advise on further recs.  Thanks!    Last ov: 06/02/16 Next ov: 12/01/16  Allergies  Allergen Reactions  . Invokana [Canagliflozin] Other (See Comments)    Yeast infectios  . Losartan     Angioedema   . Metformin And Related Diarrhea  . Latex Other (See Comments)    Other Reaction: redness, blisters  . Other Other (See Comments)    Other Reaction: redness, blisters  . Oxycodone-Acetaminophen Itching    TYLOX. Caused internal itching per patient    Current Outpatient Prescriptions on File Prior to Visit  Medication Sig Dispense Refill  . etodolac (LODINE) 400 MG tablet Take 400 mg by mouth 2 (two) times daily.    . Fluticasone Furoate-Vilanterol (BREO ELLIPTA) 100-25 MCG/INH AEPB INHALE 1 PUFF INTO LUNGS ONCE DAILY. 60 each 5  . gabapentin (NEURONTIN) 300 MG capsule Take 600 mg by mouth 2 (two) times daily.     Marland Kitchen HYDROcodone-acetaminophen (NORCO) 10-325 MG tablet Take 1 tablet by mouth every 6 (six) hours as needed for moderate pain. 75 tablet 0  . insulin detemir (LEVEMIR) 100 UNIT/ML injection Inject 22 Units into the skin at bedtime.    Marland Kitchen ipratropium (ATROVENT) 0.03 % nasal spray 1-2 puffs each nostril up to 3 times daily, before meals 30 mL 12  . ipratropium-albuterol (DUONEB) 0.5-2.5 (3) MG/3ML SOLN 1 neb every 4-6 hours for wheeze/ cough 360 mL 12  . lamoTRIgine (LAMICTAL) 100 MG tablet Take 100 mg by mouth 2 (two) times daily. 100 mg in the morning, 50 mg at night    . levothyroxine (SYNTHROID, LEVOTHROID) 75 MCG tablet TAKE (1) TABLET BY MOUTH ONCE DAILY BEFORE BREAKFAST. 30 tablet 5  . loratadine (CLARITIN) 10 MG tablet Take 10 mg by mouth  daily.    . metoprolol (LOPRESSOR) 50 MG tablet Take 1 tablet by mouth daily.    . Multiple Vitamins-Minerals (MULTIVITAMINS THER. W/MINERALS) TABS tablet TAKE ONE TABLET BY MOUTH ONCE DAILY. 30 each 11  . NOVOLOG FLEXPEN 100 UNIT/ML FlexPen Inject into the skin 4 (four) times daily. Sliding scale    . Omega 3 1000 MG CAPS TAKE (1) CAPSULE BY MOUTH ONCE DAILY. 30 capsule 11  . primidone (MYSOLINE) 50 MG tablet Take 2 tablets (100 mg total) by mouth 2 (two) times daily. 360 tablet 1  . sucralfate (CARAFATE) 1 g tablet     . tiZANidine (ZANAFLEX) 4 MG tablet TAKE 1 TABLET BY MOUTH AT BEDTIME AS NEEDED FOR MUSCLE SPASMS. 30 tablet 11  . Vilazodone HCl (VIIBRYD) 40 MG TABS Take 40 mg by mouth daily.     Current Facility-Administered Medications on File Prior to Visit  Medication Dose Route Frequency Provider Last Rate Last Dose  . levalbuterol (XOPENEX) nebulizer solution 0.63 mg  0.63 mg Nebulization Once Deneise Lever, MD

## 2016-06-11 NOTE — Telephone Encounter (Signed)
Attempted to contact [place] back. No answer and I could not leave a message. Will try back.

## 2016-06-11 NOTE — Telephone Encounter (Signed)
It is just overdrying her nose. Use it les often- only if really needed

## 2016-06-12 NOTE — Telephone Encounter (Signed)
Called and spoke to [place]. Informed her of the recs per CY. Judeen Hammans verbalized understanding and denied any further questions or concerns at this time.

## 2016-06-18 DIAGNOSIS — M5416 Radiculopathy, lumbar region: Secondary | ICD-10-CM | POA: Diagnosis not present

## 2016-06-18 DIAGNOSIS — M4120 Other idiopathic scoliosis, site unspecified: Secondary | ICD-10-CM | POA: Diagnosis not present

## 2016-06-22 ENCOUNTER — Ambulatory Visit: Payer: Self-pay | Admitting: Gastroenterology

## 2016-06-24 DIAGNOSIS — F3163 Bipolar disorder, current episode mixed, severe, without psychotic features: Secondary | ICD-10-CM | POA: Diagnosis not present

## 2016-06-25 DIAGNOSIS — S330XXS Traumatic rupture of lumbar intervertebral disc, sequela: Secondary | ICD-10-CM | POA: Diagnosis not present

## 2016-06-25 DIAGNOSIS — E1165 Type 2 diabetes mellitus with hyperglycemia: Secondary | ICD-10-CM | POA: Diagnosis not present

## 2016-06-25 DIAGNOSIS — K267 Chronic duodenal ulcer without hemorrhage or perforation: Secondary | ICD-10-CM | POA: Diagnosis not present

## 2016-06-25 DIAGNOSIS — J42 Unspecified chronic bronchitis: Secondary | ICD-10-CM | POA: Diagnosis not present

## 2016-07-01 ENCOUNTER — Telehealth: Payer: Self-pay | Admitting: Internal Medicine

## 2016-07-01 NOTE — Telephone Encounter (Signed)
Pt's last AVS showed she was to return to see CY in 6 months.  Per pt's cxr she had at that office visit, pt was to have a follow up cxr in approx 3 weeks. Per 2/28 phone note, this was faxed to [facility] living facility.   Called pt, lmtcb for pt.

## 2016-07-07 NOTE — Telephone Encounter (Signed)
LMTCB

## 2016-07-08 NOTE — Telephone Encounter (Signed)
Noted  

## 2016-07-08 NOTE — Telephone Encounter (Signed)
Patient returned phone call, contact # [phone].[name]

## 2016-07-08 NOTE — Telephone Encounter (Signed)
atc pt, was told by unnamed man that the correct callback number is [phone]. Called this number and made pt aware that she is due for a cxr and not a rov.  Pt states she refuses to have a cxr at [facility] as they "only do portable xrays and we know better than to order one on her because it doesn't show lungs correctly".  I advised pt that her other option would be to come in to the office to have cxr in our facilities, which pt declined.    Forwarding to CY as fyi that pt is refusing follow up cxr as recommended.

## 2016-07-08 NOTE — Telephone Encounter (Signed)
LMTCB

## 2016-07-09 DIAGNOSIS — F3163 Bipolar disorder, current episode mixed, severe, without psychotic features: Secondary | ICD-10-CM | POA: Diagnosis not present

## 2016-07-09 DIAGNOSIS — F4312 Post-traumatic stress disorder, chronic: Secondary | ICD-10-CM | POA: Diagnosis not present

## 2016-07-13 ENCOUNTER — Telehealth: Payer: Self-pay | Admitting: Internal Medicine

## 2016-07-13 NOTE — Telephone Encounter (Signed)
Attempted to contact pt, phone just rung, unable to leave a vm

## 2016-07-14 NOTE — Telephone Encounter (Signed)
Called and spoke to pt. Pt states she does not want a portable CXR she would prefer a 2V CXR to f/u after her pna. Advised pt per CY's last CXR results he recommended pt have a f/u CXR, the order has already been placed. Pt verbalized understanding and is aware to come by office and have CXR. Nothing further needed at this time.    Notes Recorded by Deneise Lever, MD on 06/02/2016 at 3:10 PM EST CXR- partial clearing with a little residual from the pneumonia still seen in the right lung. COPD changes. Radiologist suggests one last CXR in a few weeks o make sure the lung has cleared completely. The xray picture often lags like this, long after the real infection is gone.

## 2016-07-14 NOTE — Telephone Encounter (Signed)
Attempted to contact the pt but the line just keeps riniging.  Will try back later.

## 2016-07-16 DIAGNOSIS — F4312 Post-traumatic stress disorder, chronic: Secondary | ICD-10-CM | POA: Diagnosis not present

## 2016-07-16 DIAGNOSIS — F3163 Bipolar disorder, current episode mixed, severe, without psychotic features: Secondary | ICD-10-CM | POA: Diagnosis not present

## 2016-07-21 DIAGNOSIS — M545 Low back pain: Secondary | ICD-10-CM | POA: Diagnosis not present

## 2016-07-21 DIAGNOSIS — Z6828 Body mass index (BMI) 28.0-28.9, adult: Secondary | ICD-10-CM | POA: Diagnosis not present

## 2016-07-21 DIAGNOSIS — G250 Essential tremor: Secondary | ICD-10-CM | POA: Diagnosis not present

## 2016-07-21 DIAGNOSIS — I10 Essential (primary) hypertension: Secondary | ICD-10-CM | POA: Diagnosis not present

## 2016-07-21 DIAGNOSIS — M4120 Other idiopathic scoliosis, site unspecified: Secondary | ICD-10-CM | POA: Diagnosis not present

## 2016-07-21 DIAGNOSIS — M5416 Radiculopathy, lumbar region: Secondary | ICD-10-CM | POA: Diagnosis not present

## 2016-07-23 ENCOUNTER — Telehealth: Payer: Self-pay | Admitting: Neurology

## 2016-07-23 NOTE — Telephone Encounter (Signed)
Received notes from Dr. Vertell Limber dated 07/21/2016.  Patient continued to complain of back pain.  He is scheduled with the patient for repeat injections.  He started her on OxyContin, 10 mg twice a day, but also noted that he did not want this to affect her falling.

## 2016-07-24 ENCOUNTER — Encounter: Payer: Self-pay | Admitting: Podiatry

## 2016-07-24 ENCOUNTER — Telehealth: Payer: Self-pay | Admitting: Podiatry

## 2016-07-24 NOTE — Telephone Encounter (Signed)
Patient came into the office this morning to pick up brace. Dispensed brace to patient per Liliane Channel.

## 2016-07-30 DIAGNOSIS — F4312 Post-traumatic stress disorder, chronic: Secondary | ICD-10-CM | POA: Diagnosis not present

## 2016-07-30 DIAGNOSIS — F3163 Bipolar disorder, current episode mixed, severe, without psychotic features: Secondary | ICD-10-CM | POA: Diagnosis not present

## 2016-07-31 ENCOUNTER — Telehealth: Payer: Self-pay | Admitting: Internal Medicine

## 2016-07-31 ENCOUNTER — Ambulatory Visit (INDEPENDENT_AMBULATORY_CARE_PROVIDER_SITE_OTHER)
Admission: RE | Admit: 2016-07-31 | Discharge: 2016-07-31 | Disposition: A | Discharge status: Home / Self Care | Payer: Medicare Other | Source: Ambulatory Visit | Attending: Internal Medicine | Admitting: Internal Medicine

## 2016-07-31 DIAGNOSIS — J189 Pneumonia, unspecified organism: Secondary | ICD-10-CM

## 2016-07-31 NOTE — Telephone Encounter (Signed)
Called number listed, this number is to [facility], who stated that any clinical info for this pt needs to be faxed to [phone] attn: Gillis Santa.  Pt was unavailable to speak per [facility] employee.  CXR faxed to above fax.  Nothing further needed.

## 2016-08-04 ENCOUNTER — Telehealth: Payer: Self-pay | Admitting: Internal Medicine

## 2016-08-04 NOTE — Telephone Encounter (Signed)
Spoke with pt and she needed the results of her xray, which I informed her of per CY. She also requested the results per faxed to her. Fax number provided in previous message was correct. Report was printed and faxed. Noting further is needed.    Notes recorded by Deneise Lever, MD on 07/31/2016 at 1:36 PM EDT CXR- lungs are clear. No active process.

## 2016-08-05 DIAGNOSIS — E1142 Type 2 diabetes mellitus with diabetic polyneuropathy: Secondary | ICD-10-CM | POA: Diagnosis not present

## 2016-08-05 DIAGNOSIS — R4789 Other speech disturbances: Secondary | ICD-10-CM | POA: Diagnosis not present

## 2016-08-05 DIAGNOSIS — G250 Essential tremor: Secondary | ICD-10-CM | POA: Diagnosis not present

## 2016-08-05 DIAGNOSIS — J4540 Moderate persistent asthma, uncomplicated: Secondary | ICD-10-CM | POA: Diagnosis not present

## 2016-08-05 DIAGNOSIS — S91209A Unspecified open wound of unspecified toe(s) with damage to nail, initial encounter: Secondary | ICD-10-CM | POA: Diagnosis not present

## 2016-08-05 DIAGNOSIS — F319 Bipolar disorder, unspecified: Secondary | ICD-10-CM | POA: Diagnosis not present

## 2016-08-05 DIAGNOSIS — E039 Hypothyroidism, unspecified: Secondary | ICD-10-CM | POA: Diagnosis not present

## 2016-08-05 DIAGNOSIS — Z8679 Personal history of other diseases of the circulatory system: Secondary | ICD-10-CM | POA: Diagnosis not present

## 2016-08-05 DIAGNOSIS — Z9181 History of falling: Secondary | ICD-10-CM | POA: Diagnosis not present

## 2016-08-05 DIAGNOSIS — J449 Chronic obstructive pulmonary disease, unspecified: Secondary | ICD-10-CM | POA: Diagnosis not present

## 2016-08-06 DIAGNOSIS — F4312 Post-traumatic stress disorder, chronic: Secondary | ICD-10-CM | POA: Diagnosis not present

## 2016-08-06 DIAGNOSIS — F3163 Bipolar disorder, current episode mixed, severe, without psychotic features: Secondary | ICD-10-CM | POA: Diagnosis not present

## 2016-08-17 ENCOUNTER — Emergency Department (HOSPITAL_COMMUNITY)
Admission: EM | Admit: 2016-08-17 | Discharge: 2016-08-17 | Disposition: A | Discharge status: Home / Self Care | Payer: Medicare Other | Attending: Emergency Medicine | Admitting: Emergency Medicine

## 2016-08-17 ENCOUNTER — Encounter (HOSPITAL_COMMUNITY): Payer: Self-pay | Admitting: Cardiology

## 2016-08-17 ENCOUNTER — Emergency Department (HOSPITAL_COMMUNITY): Payer: Medicare Other

## 2016-08-17 DIAGNOSIS — E039 Hypothyroidism, unspecified: Secondary | ICD-10-CM | POA: Diagnosis not present

## 2016-08-17 DIAGNOSIS — E119 Type 2 diabetes mellitus without complications: Secondary | ICD-10-CM | POA: Diagnosis not present

## 2016-08-17 DIAGNOSIS — Z87891 Personal history of nicotine dependence: Secondary | ICD-10-CM | POA: Insufficient documentation

## 2016-08-17 DIAGNOSIS — I10 Essential (primary) hypertension: Secondary | ICD-10-CM | POA: Insufficient documentation

## 2016-08-17 DIAGNOSIS — J40 Bronchitis, not specified as acute or chronic: Secondary | ICD-10-CM | POA: Insufficient documentation

## 2016-08-17 DIAGNOSIS — R279 Unspecified lack of coordination: Secondary | ICD-10-CM | POA: Diagnosis not present

## 2016-08-17 DIAGNOSIS — Z79899 Other long term (current) drug therapy: Secondary | ICD-10-CM | POA: Insufficient documentation

## 2016-08-17 DIAGNOSIS — R0602 Shortness of breath: Secondary | ICD-10-CM | POA: Diagnosis present

## 2016-08-17 DIAGNOSIS — Z794 Long term (current) use of insulin: Secondary | ICD-10-CM | POA: Insufficient documentation

## 2016-08-17 DIAGNOSIS — Z7401 Bed confinement status: Secondary | ICD-10-CM | POA: Diagnosis not present

## 2016-08-17 HISTORY — DX: Pneumonitis due to inhalation of food and vomit: J69.0

## 2016-08-17 MED ORDER — DEXAMETHASONE 4 MG PO TABS
12.0000 mg | ORAL_TABLET | Freq: Once | ORAL | Status: AC
  Administered 2016-08-17: 12 mg via ORAL
  Filled 2016-08-17: qty 3

## 2016-08-17 MED ORDER — IPRATROPIUM-ALBUTEROL 0.5-2.5 (3) MG/3ML IN SOLN
3.0000 mL | Freq: Once | RESPIRATORY_TRACT | Status: AC
  Administered 2016-08-17: 3 mL via RESPIRATORY_TRACT
  Filled 2016-08-17: qty 3

## 2016-08-17 NOTE — ED Notes (Signed)
Pt eating crackers at this time. Aware we are waiting on a ride.

## 2016-08-17 NOTE — ED Provider Notes (Signed)
[facility] DEPT Provider Note   CSN: [id number] Arrival date & time: 08/17/16  1458  By signing my name below, I, [name], attest that this documentation has been prepared under the direction and in the presence of Elizabeth Manifold, MD. Electronically Signed: Sonum [name], Education administrator. 08/17/16. 5:51 PM.  History   Chief Complaint Chief Complaint  Patient presents with  . Shortness of Breath    The history is provided by the patient. No language interpreter was used.     HPI Comments: [name] is a 66 y.o. female who presents to the Emergency Department complaining of 3 days of SOB that worsened today. She has associated wheezing, chest congestion, and right sided lateral chest pain. She tried a breathing treatment with mild relief. She states the right lateral chest pain is worse with deep breathing and coughing. She notes having aspiration PNA 2 months ago and also notes lower leg swelling for the past 1-2 months. She denies fever.   Past Medical History:  Diagnosis Date  . Allergic rhinitis    uses Nasonex daily as needed  . Allergy to angiotensin receptor blockers (ARB) 01/22/2014   Angioedema  . Anxiety    takes Ativan daily as needed  . Aspiration pneumonia ([place])   . Asthma    Albuterol inhaler and Neb daily as needed  . 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 [name], residual palpitations  . Chronic back pain    reason unknown  . Constipation   . DDD (degenerative disc disease), lumbar   . Depression    takes Zoloft daily as well as Cymbalta  . Diabetes mellitus    takes NOvolin daily  . Dysphagia   . Dysrhythmia    SVT ==ablation done by Dr. Rayann Heman 2007  . Esophageal reflux    takes Zantac daily  . History of bronchitis Dec 2014  . Hypertension    takes Losartan daily  . Hypothyroidism    takes Synthroid daily  . Joint pain   . Joint swelling    left ankle  . Low sodium syndrome   . Lumbar  radiculopathy   . Seizures ([place]) 04-05-13   one time ran out of Primidone and had stopped it cold [place]  . Tremor, essential    takes Mysoline and Neurontin daily.  . Vocal cord dysfunction   . Weakness    in left arm    Patient Active Problem List   Diagnosis Date Noted  . CAP (community acquired pneumonia) 04/21/2016  . History of colonic polyps   . Diverticulosis of colon without hemorrhage   . Other hemorrhoids   . [name]'s ring   . Mucosal abnormality of stomach   . Encounter for screening colonoscopy 02/21/2015  . Upper airway cough syndrome 01/18/2015  . OAB (overactive bladder) 12/30/2014  . Incontinence in female 12/25/2014  . MDD (major depressive disorder), recurrent severe, without psychosis ([place]) 12/14/2014  . Thrush of mouth and esophagus ([place]) 11/23/2014  . Depression with somatization 11/05/2014  . Type II diabetes mellitus with manifestations ([place]) 10/23/2014  . Visit for screening mammogram 08/21/2014  . Hypokalemia 05/30/2014  . Tinea corporis 05/30/2014  . Lumbar scoliosis 04/24/2014  . Obstructive sleep apnea 04/23/2014  . Allergy to angiotensin receptor blockers (ARB) 01/22/2014  . S/P deep brain stimulator placement 01/10/2014  . Essential tremor 12/04/2013  . Greater trochanteric bursitis of left hip 11/07/2013  . Spinal stenosis of lumbar region 10/30/2013  .  Right thyroid nodule 10/12/2013  . CMC arthritis, thumb, degenerative 06/15/2013  . IBS (irritable bowel syndrome) 11/03/2012  . Esophageal stenosis 11/01/2012  . Asthma, moderate persistent, poorly-controlled 09/01/2012  . Hypothyroidism 12/22/2011  . Hyperlipidemia with target LDL less than 100 06/22/2008  . Essential hypertension 09/04/2007  . TREMOR, ESSENTIAL 04/19/2007  . Seasonal and perennial allergic rhinitis 04/19/2007  . Esophageal reflux 04/19/2007    Past Surgical History:  Procedure Laterality Date  . ABLATION FOR AVNRT    . BARTHOLIN GLAND CYST EXCISION     x 2  .  BIOPSY  03/04/2015   Procedure: BIOPSY (Duodenal, Gastric);  Surgeon: Daneil Dolin, MD;  Location: [facility];  Service: Endoscopy;;  . CESAREAN SECTION    . CHOLECYSTECTOMY    . COLONOSCOPY  01/16/2004   OMV:[facility]NO rectum/colon  . COLONOSCOPY WITH PROPOFOL N/A 03/04/2015   Dr.[name]- internal hemorrhoids, pancolonic diverticulosis, colonic polyp= tubular adenoma  . ESOPHAGEAL DILATION N/A 03/04/2015   Procedure: ESOPHAGEAL DILATION WITH 54FR MALONEY DILATOR;  Surgeon: Daneil Dolin, MD;  Location: [facility];  Service: Endoscopy;  Laterality: N/A;  . ESOPHAGOGASTRODUODENOSCOPY (EGD) WITH ESOPHAGEAL DILATION  04/03/2002   BSJ:GGEZMOQH'U ring, otherwise normal esophagus, status post dilation with 56 F/Normal stomach  . ESOPHAGOGASTRODUODENOSCOPY (EGD) WITH ESOPHAGEAL DILATION N/A 11/08/2012   Dr. [name]:schatzki's ring s/p dilation/hiatal hernia  . ESOPHAGOGASTRODUODENOSCOPY (EGD) WITH PROPOFOL N/A 03/04/2015   Dr.[name]- schatzki's ring, hiatal hernia, scattered erosions bx= chronic inactive gastritis  . HEMORRHOID BANDING  2017   Dr.[name]  . LEFT ANKLE LIGAMENT REPAIR     x 2  . LEFT ELBOW REPAIR    . MAXIMUM ACCESS (MAS)POSTERIOR LUMBAR INTERBODY FUSION (PLIF) 1 LEVEL N/A 04/24/2014   Procedure: Lumbar four-five Maximum access Surgery posterior lumbar interbody fusion;  Surgeon: [name], MD;  Location: [facility];  Service: Neurosurgery;  Laterality: N/A;  Lumbar four-five Maximum access Surgery posterior lumbar interbody fusion  . PARTIAL HYSTERECTOMY    . POLYPECTOMY  03/04/2015   Procedure: POLYPECTOMY (descending colon);  Surgeon: Daneil Dolin, MD;  Location: [facility];  Service: Endoscopy;;  . PULSE GENERATOR IMPLANT Bilateral 12/15/2013   Procedure: BILATERAL PULSE GENERATOR IMPLANT;  Surgeon: [name], MD;  Location: [facility] [place] [facility];  Service: Neurosurgery;  Laterality: Bilateral;  BILATERAL  . RIGHT BREAST CYST     benign  . SUBTHALAMIC STIMULATOR INSERTION Bilateral 12/04/2013    Procedure: Bilateral Deep brain stimulator placement;  Surgeon: [name], MD;  Location: [place] [facility] OR[facility];  Service: Neurosurgery;  Laterality: Bilateral;  Bilateral Deep brain stimulator placement  . TONSILLECTOMY      OB History    Gravida Para Term Preterm AB Living   2 2 2     1    SAB TAB Ectopic Multiple Live Births                   Home Medications    Prior to Admission medications   Medication Sig Start Date End Date Taking? Authorizing Provider  etodolac (LODINE) 400 MG tablet Take 400 mg by mouth 2 (two) times daily.    [provider]  Fluticasone Furoate-Vilanterol (BREO ELLIPTA) 100-25 MCG/INH AEPB INHALE 1 PUFF INTO LUNGS ONCE DAILY. 05/13/15   Baird Lyons D, MD  gabapentin (NEURONTIN) 300 MG capsule Take 600 mg by mouth 2 (two) times daily.     [provider]  HYDROcodone-acetaminophen (NORCO) 10-325 MG tablet Take 1 tablet by mouth every 6 (six) hours as needed for moderate pain.  05/21/15   Janith Lima, MD  insulin detemir (LEVEMIR) 100 UNIT/ML injection Inject 22 Units into the skin at bedtime.    [provider]  ipratropium (ATROVENT) 0.03 % nasal spray 1-2 puffs each nostril up to 3 times daily, before meals 06/02/16   Baird Lyons D, MD  ipratropium-albuterol (DUONEB) 0.5-2.5 (3) MG/3ML SOLN 1 neb every 4-6 hours for wheeze/ cough 04/21/16   Baird Lyons D, MD  lamoTRIgine (LAMICTAL) 100 MG tablet Take 100 mg by mouth 2 (two) times daily. 100 mg in the morning, 50 mg at night    [provider]  levothyroxine (SYNTHROID, LEVOTHROID) 75 MCG tablet TAKE (1) TABLET BY MOUTH ONCE DAILY BEFORE BREAKFAST. 01/12/15   Janith Lima, MD  loratadine (CLARITIN) 10 MG tablet Take 10 mg by mouth daily.    [provider]  metoprolol (LOPRESSOR) 50 MG tablet Take 1 tablet by mouth daily. 04/15/16   [provider]  Multiple Vitamins-Minerals (MULTIVITAMINS THER. W/MINERALS) TABS tablet TAKE ONE TABLET BY MOUTH ONCE DAILY.  04/16/15   Janith Lima, MD  NOVOLOG FLEXPEN 100 UNIT/ML FlexPen Inject into the skin 4 (four) times daily. Sliding scale 07/01/15   [provider]  Omega 3 1000 MG CAPS TAKE (1) CAPSULE BY MOUTH ONCE DAILY. 04/16/15   Janith Lima, MD  primidone (MYSOLINE) 50 MG tablet Take 2 tablets (100 mg total) by mouth 2 (two) times daily. 03/06/15   TatEustace Quail, DO  sucralfate (CARAFATE) 1 g tablet  04/24/16   [provider]  tiZANidine (ZANAFLEX) 4 MG tablet TAKE 1 TABLET BY MOUTH AT BEDTIME AS NEEDED FOR MUSCLE SPASMS. 03/19/15   Janith Lima, MD  Vilazodone HCl (VIIBRYD) 40 MG TABS Take 40 mg by mouth daily.    [provider]    Family History Family History  Problem Relation Age of Onset  . Lung cancer Father        DIED AGE 39 LUNG CA  . Alcohol abuse Father   . Anxiety disorder Father   . Depression Father   . COPD Mother        DIED AGE 45,MRSA,COPD,PNEUMONIA  . Pneumonia Mother        DIED AGE 45,MRSA,COPD,PNEUMONIA  . Supraventricular tachycardia Mother   . Anxiety disorder Mother   . Depression Mother   . Alcohol abuse Mother   . Heart disease Sister        DIED AGE 4, SMOKER,?HEART  . COPD Brother        AGE 66  . Colon cancer Neg Hx     Social History Social History  Substance Use Topics  . Smoking status: Former Smoker    Packs/day: 0.30    Years: 10.00    Types: Cigarettes    Quit date: 04/23/1993  . Smokeless tobacco: Never Used     Comment: quit smoking 25+yrs ago  . Alcohol use No     Allergies   Invokana [canagliflozin]; Losartan; Metformin and related; Latex; Other; and Oxycodone-acetaminophen   Review of Systems Review of Systems  All other systems reviewed and are negative for acute change except as noted in the HPI.   Physical Exam Updated Vital Signs BP (!) 144/87 (BP Location: Right Arm)   Pulse 88   Temp 97.9 F (36.6 C) (Oral)   Resp (!) 22   Ht 5\' 4"  (1.626 m)   Wt 154 lb (69.9 kg)   SpO2 96%   BMI  26.43 kg/m  Physical Exam  Constitutional: She is oriented to person, place, and time. She appears well-developed and well-nourished. No distress.  HENT:  Head: Normocephalic and atraumatic.  Eyes: EOM are normal.  Neck: Normal range of motion.  Cardiovascular: Normal rate, regular rhythm and normal heart sounds.   Pulmonary/Chest: Effort normal. She has wheezes.  Expiratory wheezing. Speaks in complete sentences.   Abdominal: Soft. She exhibits no distension. There is no tenderness.  Musculoskeletal: Normal range of motion.  Neurological: She is alert and oriented to person, place, and time.  Skin: Skin is warm and dry.  Psychiatric: She has a normal mood and affect. Judgment normal.  Nursing note and vitals reviewed.    ED Treatments / Results  DIAGNOSTIC STUDIES: Oxygen Saturation is 96% on RA, adequate by my interpretation.    COORDINATION OF CARE: 5:41 PM Discussed treatment plan with pt at bedside and pt agreed to plan.   Labs (all labs ordered are listed, but only abnormal results are displayed) Labs Reviewed - No data to display  EKG  EKG Interpretation None       Radiology Dg Chest 2 View  Result Date: 08/17/2016 CLINICAL DATA:  Worsening shortness of breath, right lower chest pain. EXAM: CHEST  2 VIEW COMPARISON:  07/31/2016. FINDINGS: Trachea is midline. Heart size normal. Stimulator packs project over the perihilar regions. Minimal linear scarring in the medial right lung base. Lungs are otherwise clear. No pleural fluid. IMPRESSION: No acute findings. Electronically Signed   By: Lorin Picket M.D.   On: 08/17/2016 16:05    Procedures Procedures (including critical care time)  Medications Ordered in ED Medications - No data to display   Initial Impression / Assessment and Plan / ED Course  I have reviewed the triage vital signs and the nursing notes.  Pertinent labs & imaging results that were available during my care of the patient were reviewed  by me and considered in my medical decision making (see chart for details).     65yF with dyspnea. Wheezing on exam. Afebrile. CXR clear. Doubt acute HF. WOB is not significantly increased.   Final Clinical Impressions(s) / ED Diagnoses   Final diagnoses:  Bronchitis    New Prescriptions Discharge Medication List as of 08/17/2016  6:23 PM     I personally preformed the services scribed in my presence. The recorded information has been reviewed is accurate. Elizabeth Manifold, MD.    Elizabeth Manifold, MD 08/29/16 [phone]

## 2016-08-17 NOTE — ED Triage Notes (Signed)
Worsening Sob that started this morning.  Had one  breathing treatment at home .

## 2016-08-19 ENCOUNTER — Telehealth: Payer: Self-pay | Admitting: Internal Medicine

## 2016-08-19 DIAGNOSIS — F3163 Bipolar disorder, current episode mixed, severe, without psychotic features: Secondary | ICD-10-CM | POA: Diagnosis not present

## 2016-08-19 NOTE — Telephone Encounter (Signed)
Pt is requesting a HFU with only [name]- was seen in ED on 5/14 for bronchitis.    [name] please advise if pt can be worked in to schedule.  Pt refuses to see NP or another provider.  Thanks.

## 2016-08-19 NOTE — Telephone Encounter (Signed)
Katie- next week some time, not emergent.

## 2016-08-20 DIAGNOSIS — F3163 Bipolar disorder, current episode mixed, severe, without psychotic features: Secondary | ICD-10-CM | POA: Diagnosis not present

## 2016-08-20 DIAGNOSIS — F4312 Post-traumatic stress disorder, chronic: Secondary | ICD-10-CM | POA: Diagnosis not present

## 2016-08-20 NOTE — Telephone Encounter (Signed)
Dr Annamaria Boots please advise if patient can be added on one day next week. Pt refused appt offered on Mon 08/24/16.  Thanks.  ( you have a few blocked spots on Thursday 08/27/16)

## 2016-08-20 NOTE — Telephone Encounter (Signed)
Patient called to check on appt status.  Advised it would need to be next week and nurse would call her with date and time.  She states she cannot come on Mon, 05/21.  CB is [phone].

## 2016-08-20 NOTE — Telephone Encounter (Signed)
Ok to use a blocked spot next Thursday

## 2016-08-20 NOTE — Telephone Encounter (Signed)
Spoke with [facility], Nurse refused to let me speak with the patient.  Scheduled appt for patient on 08/27/16 at 230 with CY -per CY They will call back once transportation looks at the appt to verify that this will work.  Will await call back.

## 2016-08-21 NOTE — Telephone Encounter (Signed)
Patient calling back stating she has spoken with transportation at [facility] and they can bring her to her appt on 08/27/2016 at 2:30 to see Dr. Annamaria Boots.  No call back is needed.

## 2016-08-21 NOTE — Telephone Encounter (Signed)
Noted Will sign off 

## 2016-08-24 DIAGNOSIS — M545 Low back pain: Secondary | ICD-10-CM | POA: Diagnosis not present

## 2016-08-24 DIAGNOSIS — M5416 Radiculopathy, lumbar region: Secondary | ICD-10-CM | POA: Diagnosis not present

## 2016-08-24 DIAGNOSIS — M4120 Other idiopathic scoliosis, site unspecified: Secondary | ICD-10-CM | POA: Diagnosis not present

## 2016-08-27 ENCOUNTER — Encounter: Payer: Self-pay | Admitting: Internal Medicine

## 2016-08-27 ENCOUNTER — Ambulatory Visit (INDEPENDENT_AMBULATORY_CARE_PROVIDER_SITE_OTHER): Payer: Medicare Other | Admitting: Internal Medicine

## 2016-08-27 DIAGNOSIS — J4540 Moderate persistent asthma, uncomplicated: Secondary | ICD-10-CM | POA: Diagnosis not present

## 2016-08-27 DIAGNOSIS — K219 Gastro-esophageal reflux disease without esophagitis: Secondary | ICD-10-CM

## 2016-08-27 NOTE — Assessment & Plan Note (Signed)
High risk for recurrent reflux and aspiration because of her neuromuscular problems including vocal tremor. Continued close attention to reflux precautions emphasized. It has helped significantly since she has been sleeping with head of bed elevated in an adjustable bed.

## 2016-08-27 NOTE — Assessment & Plan Note (Signed)
Nonspecific illness for which she went to ED last week, possibly viral. Steroid therapy may explain very light thrush which should clear. Acute event she describes this morning better fits possible mild aspiration event. She is markedly improved now. Meds are appropriate.

## 2016-08-27 NOTE — Patient Instructions (Addendum)
Continue present meds  Be careful not to lie down after you eat, and sleep with the head of your bed elevated, to reduce the chance of reflux and aspiration  Ok to keep the August appointment

## 2016-08-27 NOTE — Progress Notes (Signed)
Patient ID: [name], female    DOB: 03/20/1951, 66 y.o.   MRN: [id number]  HPI  female former smoker followed for Allergic rhinitis, Asthma, complicated by anxiety/VCD, CAD, GERD, DM, tachycardia, tremor/deep brain stimulator, HBP, now living in assisted living Allergy Profile 01/10/2013-total IgE 166 with significant elevations for most common allergens except molds She had been on allergy vaccine years ago. Office Spirometry 12/13/2014-mild obstruction, mild restriction of forced vital capacity Upper endoscopy 2016-[name]'s ring and gastric erosions -----------------------------------------------------------  06/02/2016-66 year old female former smoker followed for Allergic rhinitis, Asthma, complicated by anxiety/VCD, CAD, GERD, DM, tachycardia, tremor/deep brain stimulator, HBP, now living in assisted living Pt. still coughing, especially in the mornings. Denies any SOB or chest pain.  Rattling cough after eating even while sitting upright. Has hospital bed now. Hasn't recognized obvious reflux or aspiration events. Denies fever, chills, chest pain, discolored sputum. Watery nose with meals.  08/27/16-66 year old female former smoker followed for Allergic rhinitis, Asthma, complicated by anxiety/VCD, CAD, GERD, DM, tachycardia, tremor/deep brain stimulator, HBP, now living in assisted living Hospital follow up for bronchitis patient states that she is doing a little better states that she is taking 3 breathing treatments a day after coming out of the hospital patient was only seen in the ED   ED visit 08/17/16 for complaint 3 days SOB with wheeze, chest congestion, right-sided lateral chest pain. Aspiration pneumonia 2 months prior. Leg swelling times one or 2 months. O2 sat was 96%, CXR NAD. Treated with Decadron and improved. This morning had "another attack" after breakfast. Noted wheezing and coughing and felt congested. Has been using nebulizer machine 3 times daily and use it  this morning with good response. Now feels mostly back to normal. Did not recognize a choking or reflux event. Continues to sleep with head of adjustable bed elevated.   ROS-see HPI    "+" = pos Constitutional:    weight loss, night sweats, fevers, chills, fatigue, lassitude. HEENT:    headaches, difficulty swallowing, tooth/dental problems, sore throat,       sneezing, itching, ear ache, nasal congestion, post nasal drip, snoring CV:    chest pain, orthopnea, PND, swelling in lower extremities, anasarca,                                                  dizziness, palpitations Resp:   +shortness of breath with exertion or at rest.                +productive cough,   + non-productive cough, coughing up of blood.              change in color of mucus.  +wheezing.   Skin:    rash or lesions. GI:  + heartburn, indigestion, abdominal pain, nausea, vomiting, GU:  MS:   joint pain, stiffness, decreased range of motion, back pain. Neuro-    + chronic tremor Psych:  +change in mood or affect.  +depression or anxiety.   memory loss.  OBJ- Physical Exam General- Alert, Oriented, Affect-appropriate, Distress- none acute, talkative, +overweight Skin- rash-none, lesions- none, excoriation- none Lymphadenopathy- none Head- atraumatic            Eyes- Gross vision intact, PERRLA, conjunctivae and secretions clear            Ears- Hearing, canals-normal  Nose- Clear, no-Septal dev, mucus, polyps, erosion, perforation             Throat- Mallampati II , mucosa -tongue slightly coated , drainage- none, tonsils- atrophic, mild hoarseness Neck- flexible , trachea midline, no stridor , thyroid nl, carotid no bruit Chest - symmetrical excursion , unlabored           Heart/CV- RRR , no murmur , no gallop  , no rub, nl s1 s2                           - JVD- none , edema- none, stasis changes- none, varices- none           Lung- wheeze -none, cough-none , dullness-none, rub- none, rhonchi-none            Chest wall-  Abd-  Br/ Gen/ Rectal- Not done, not indicated Extrem- cyanosis- none, clubbing, none, atrophy- none, strength- nl, + rolling walker Neuro- + tremor and head bob,+ mild dysphonia

## 2016-09-03 DIAGNOSIS — F3163 Bipolar disorder, current episode mixed, severe, without psychotic features: Secondary | ICD-10-CM | POA: Diagnosis not present

## 2016-09-03 DIAGNOSIS — F4312 Post-traumatic stress disorder, chronic: Secondary | ICD-10-CM | POA: Diagnosis not present

## 2016-09-10 DIAGNOSIS — F3163 Bipolar disorder, current episode mixed, severe, without psychotic features: Secondary | ICD-10-CM | POA: Diagnosis not present

## 2016-09-10 DIAGNOSIS — F4312 Post-traumatic stress disorder, chronic: Secondary | ICD-10-CM | POA: Diagnosis not present

## 2016-09-14 ENCOUNTER — Other Ambulatory Visit (HOSPITAL_COMMUNITY): Payer: Self-pay | Admitting: Internal Medicine

## 2016-09-14 DIAGNOSIS — Z1231 Encounter for screening mammogram for malignant neoplasm of breast: Secondary | ICD-10-CM

## 2016-09-17 DIAGNOSIS — E1142 Type 2 diabetes mellitus with diabetic polyneuropathy: Secondary | ICD-10-CM | POA: Diagnosis not present

## 2016-09-17 DIAGNOSIS — G250 Essential tremor: Secondary | ICD-10-CM | POA: Diagnosis not present

## 2016-09-23 DIAGNOSIS — M545 Low back pain: Secondary | ICD-10-CM | POA: Diagnosis not present

## 2016-09-23 DIAGNOSIS — Z6829 Body mass index (BMI) 29.0-29.9, adult: Secondary | ICD-10-CM | POA: Diagnosis not present

## 2016-09-23 DIAGNOSIS — R030 Elevated blood-pressure reading, without diagnosis of hypertension: Secondary | ICD-10-CM | POA: Diagnosis not present

## 2016-09-23 DIAGNOSIS — M4120 Other idiopathic scoliosis, site unspecified: Secondary | ICD-10-CM | POA: Diagnosis not present

## 2016-09-23 DIAGNOSIS — M5416 Radiculopathy, lumbar region: Secondary | ICD-10-CM | POA: Diagnosis not present

## 2016-09-24 DIAGNOSIS — F3163 Bipolar disorder, current episode mixed, severe, without psychotic features: Secondary | ICD-10-CM | POA: Diagnosis not present

## 2016-09-24 DIAGNOSIS — F4312 Post-traumatic stress disorder, chronic: Secondary | ICD-10-CM | POA: Diagnosis not present

## 2016-09-28 ENCOUNTER — Ambulatory Visit (HOSPITAL_COMMUNITY): Payer: Self-pay

## 2016-10-01 DIAGNOSIS — S330XXS Traumatic rupture of lumbar intervertebral disc, sequela: Secondary | ICD-10-CM | POA: Diagnosis not present

## 2016-10-01 DIAGNOSIS — K267 Chronic duodenal ulcer without hemorrhage or perforation: Secondary | ICD-10-CM | POA: Diagnosis not present

## 2016-10-01 DIAGNOSIS — J42 Unspecified chronic bronchitis: Secondary | ICD-10-CM | POA: Diagnosis not present

## 2016-10-01 DIAGNOSIS — G250 Essential tremor: Secondary | ICD-10-CM | POA: Diagnosis not present

## 2016-10-08 DIAGNOSIS — F3163 Bipolar disorder, current episode mixed, severe, without psychotic features: Secondary | ICD-10-CM | POA: Diagnosis not present

## 2016-10-08 DIAGNOSIS — K219 Gastro-esophageal reflux disease without esophagitis: Secondary | ICD-10-CM | POA: Diagnosis not present

## 2016-10-08 DIAGNOSIS — B379 Candidiasis, unspecified: Secondary | ICD-10-CM | POA: Diagnosis not present

## 2016-10-08 DIAGNOSIS — K267 Chronic duodenal ulcer without hemorrhage or perforation: Secondary | ICD-10-CM | POA: Diagnosis not present

## 2016-10-08 DIAGNOSIS — J42 Unspecified chronic bronchitis: Secondary | ICD-10-CM | POA: Diagnosis not present

## 2016-10-08 DIAGNOSIS — F4312 Post-traumatic stress disorder, chronic: Secondary | ICD-10-CM | POA: Diagnosis not present
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
## 2016-10-12 ENCOUNTER — Encounter: Payer: Self-pay | Admitting: Neurology

## 2016-10-12 ENCOUNTER — Ambulatory Visit (INDEPENDENT_AMBULATORY_CARE_PROVIDER_SITE_OTHER): Payer: Medicare Other | Admitting: Neurology

## 2016-10-12 VITALS — BP 120/80 | HR 84 | Ht 64.5 in | Wt 175.0 lb

## 2016-10-12 DIAGNOSIS — Z9689 Presence of other specified functional implants: Secondary | ICD-10-CM | POA: Diagnosis not present

## 2016-10-12 DIAGNOSIS — R270 Ataxia, unspecified: Secondary | ICD-10-CM

## 2016-10-12 DIAGNOSIS — E1142 Type 2 diabetes mellitus with diabetic polyneuropathy: Secondary | ICD-10-CM

## 2016-10-12 DIAGNOSIS — G250 Essential tremor: Secondary | ICD-10-CM | POA: Diagnosis not present

## 2016-10-12 NOTE — Patient Instructions (Signed)
1.  Decrease primidone to 50 mg twice per day 2.  I will see you in 6 months! 3.  Use your walker at all times 4.  Good to see you.

## 2016-10-12 NOTE — Procedures (Signed)
DBS Programming was performed.    Total time spent programming was 20 minutes.  Device was confirmed to be on.  Soft start was confirmed to be on.  Impedences were checked and were within normal limits.  Battery was checked and was determined to be functioning normally and not near the end of life (2.91 and 2.92).  Final settings were as follows:  Left brain electrode:     2-1+  ; Amplitude  3.0   V   ; Pulse width 90 microseconds;   Frequency   130   Hz.  Right brain electrode:     3-2+          ; Amplitude   3.6  V ;  Pulse width 90  microseconds;  Frequency   140    Hz.

## 2016-10-13 DIAGNOSIS — F3163 Bipolar disorder, current episode mixed, severe, without psychotic features: Secondary | ICD-10-CM | POA: Diagnosis not present

## 2016-10-14 DIAGNOSIS — E119 Type 2 diabetes mellitus without complications: Secondary | ICD-10-CM | POA: Diagnosis not present

## 2016-10-14 DIAGNOSIS — J42 Unspecified chronic bronchitis: Secondary | ICD-10-CM | POA: Diagnosis not present

## 2016-10-14 DIAGNOSIS — E310 Autoimmune polyglandular failure: Secondary | ICD-10-CM | POA: Diagnosis not present

## 2016-10-14 DIAGNOSIS — D649 Anemia, unspecified: Secondary | ICD-10-CM | POA: Diagnosis not present

## 2016-10-14 DIAGNOSIS — E039 Hypothyroidism, unspecified: Secondary | ICD-10-CM | POA: Diagnosis not present

## 2016-10-14 DIAGNOSIS — E559 Vitamin D deficiency, unspecified: Secondary | ICD-10-CM | POA: Diagnosis not present

## 2016-10-14 DIAGNOSIS — M810 Age-related osteoporosis without current pathological fracture: Secondary | ICD-10-CM | POA: Diagnosis not present

## 2016-10-14 DIAGNOSIS — E1165 Type 2 diabetes mellitus with hyperglycemia: Secondary | ICD-10-CM | POA: Diagnosis not present

## 2016-10-15 DIAGNOSIS — F4312 Post-traumatic stress disorder, chronic: Secondary | ICD-10-CM | POA: Diagnosis not present

## 2016-10-15 DIAGNOSIS — F3163 Bipolar disorder, current episode mixed, severe, without psychotic features: Secondary | ICD-10-CM | POA: Diagnosis not present

## 2016-10-21 DIAGNOSIS — F4312 Post-traumatic stress disorder, chronic: Secondary | ICD-10-CM | POA: Diagnosis not present

## 2016-10-21 DIAGNOSIS — F3163 Bipolar disorder, current episode mixed, severe, without psychotic features: Secondary | ICD-10-CM | POA: Diagnosis not present

## 2016-10-28 ENCOUNTER — Ambulatory Visit: Payer: Self-pay | Admitting: Neurology

## 2016-10-29 DIAGNOSIS — F4312 Post-traumatic stress disorder, chronic: Secondary | ICD-10-CM | POA: Diagnosis not present

## 2016-10-29 DIAGNOSIS — F3163 Bipolar disorder, current episode mixed, severe, without psychotic features: Secondary | ICD-10-CM | POA: Diagnosis not present

## 2016-11-03 ENCOUNTER — Encounter: Payer: Self-pay | Admitting: Cardiovascular Disease

## 2016-11-03 ENCOUNTER — Ambulatory Visit (INDEPENDENT_AMBULATORY_CARE_PROVIDER_SITE_OTHER): Payer: Medicare Other | Admitting: Cardiovascular Disease

## 2016-11-03 VITALS — BP 122/70 | HR 74 | Ht 64.5 in | Wt 176.0 lb

## 2016-11-03 DIAGNOSIS — R600 Localized edema: Secondary | ICD-10-CM | POA: Diagnosis not present

## 2016-11-03 DIAGNOSIS — I10 Essential (primary) hypertension: Secondary | ICD-10-CM

## 2016-11-03 DIAGNOSIS — R000 Tachycardia, unspecified: Secondary | ICD-10-CM | POA: Diagnosis not present

## 2016-11-03 NOTE — Progress Notes (Signed)
SUBJECTIVE: The patient returns for follow-up of tachycardia and leg swelling.  Additional history includes essential tremor with deep brain stimulator placement and diabetic peripheral neuropathy.  ECG performed in the office today which I ordered and personally interpreted demonstrates normal sinus rhythm with no ischemic ST segment or T-wave abnormalities, nor any arrhythmias.  She had one episode of palpitations within the past 2 weeks and thinks it may have been dream-related. She denies exertional chest pain and dyspnea. She sees a Engineer, water once weekly.  Soc Hx: Divorced after 71 yrs of marriage. Her ex-husband is also my patient.    Review of Systems: As per "subjective", otherwise negative.  Allergies  Allergen Reactions  . Invokana [Canagliflozin] Other (See Comments)    Yeast infectios  . Losartan     Angioedema   . Metformin And Related Diarrhea  . Latex Other (See Comments)    Other Reaction: redness, blisters  . Oxycodone-Acetaminophen Itching    TYLOX. Caused internal itching per patient     Current Outpatient Prescriptions  Medication Sig Dispense Refill  . aspirin EC 81 MG tablet Take 81 mg by mouth daily.    Marland Kitchen etodolac (LODINE) 400 MG tablet Take 400 mg by mouth 2 (two) times daily.    . Fluticasone Furoate-Vilanterol (BREO ELLIPTA) 100-25 MCG/INH AEPB INHALE 1 PUFF INTO LUNGS ONCE DAILY. 60 each 5  . gabapentin (NEURONTIN) 600 MG tablet Take 600 mg by mouth 3 (three) times daily.    Marland Kitchen HYDROcodone-acetaminophen (NORCO/VICODIN) 5-325 MG tablet Take 1 tablet by mouth daily as needed.     . Insulin Glargine (BASAGLAR KWIKPEN) 100 UNIT/ML SOPN     . ipratropium (ATROVENT) 0.03 % nasal spray 1-2 puffs each nostril up to 3 times daily, before meals 30 mL 12  . ipratropium-albuterol (DUONEB) 0.5-2.5 (3) MG/3ML SOLN 1 neb every 4-6 hours for wheeze/ cough 360 mL 12  . lamoTRIgine (LAMICTAL) 100 MG tablet Take 100 mg by mouth 2 (two) times daily. 100 mg in  the morning, 50 mg at night    . levothyroxine (SYNTHROID, LEVOTHROID) 75 MCG tablet TAKE (1) TABLET BY MOUTH ONCE DAILY BEFORE BREAKFAST. 30 tablet 5  . loratadine (CLARITIN) 10 MG tablet Take 10 mg by mouth daily.    . metoprolol (LOPRESSOR) 50 MG tablet Take 1 tablet by mouth daily.    . Multiple Vitamins-Minerals (MULTIVITAMINS THER. W/MINERALS) TABS tablet TAKE ONE TABLET BY MOUTH ONCE DAILY. 30 each 11  . NOVOLOG FLEXPEN 100 UNIT/ML FlexPen Inject into the skin 4 (four) times daily. Sliding scale    . NYSTATIN powder     . Omega 3 1000 MG CAPS TAKE (1) CAPSULE BY MOUTH ONCE DAILY. 30 capsule 11  . omeprazole (PRILOSEC) 20 MG capsule Take 20 mg by mouth daily.    . OXYCONTIN 10 MG 12 hr tablet Take 10 mg by mouth every 12 (twelve) hours.     . polyethylene glycol powder (GLYCOLAX/MIRALAX) powder     . primidone (MYSOLINE) 50 MG tablet Take 2 tablets (100 mg total) by mouth 2 (two) times daily. (Patient taking differently: Take by mouth. 100 am, 50 hs) 360 tablet 1  . sucralfate (CARAFATE) 1 g tablet     . tiZANidine (ZANAFLEX) 4 MG tablet TAKE 1 TABLET BY MOUTH AT BEDTIME AS NEEDED FOR MUSCLE SPASMS. 30 tablet 11  . Vilazodone HCl (VIIBRYD) 40 MG TABS Take 40 mg by mouth daily.     Current Facility-Administered Medications  Medication Dose  Route Frequency Provider Last Rate Last Dose  . levalbuterol (XOPENEX) nebulizer solution 0.63 mg  0.63 mg Nebulization Once Deneise Lever, MD        Past Medical History:  Diagnosis Date  . Allergic rhinitis    uses Nasonex daily as needed  . Allergy to angiotensin receptor blockers (ARB) 01/22/2014   Angioedema  . Anxiety    takes Ativan daily as needed  . Aspiration pneumonia ([place])   . Asthma    Albuterol inhaler and Neb daily as needed  . 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 [name], residual palpitations  . Chronic back pain    reason unknown  . Constipation   . DDD (degenerative disc  disease), lumbar   . Depression    takes Zoloft daily as well as Cymbalta  . Diabetes mellitus    takes NOvolin daily  . Dysphagia   . Dysrhythmia    SVT ==ablation done by Dr. Rayann Heman 2007  . Esophageal reflux    takes Zantac daily  . History of bronchitis Dec 2014  . Hypertension    takes Losartan daily  . Hypothyroidism    takes Synthroid daily  . Joint pain   . Joint swelling    left ankle  . Low sodium syndrome   . Lumbar radiculopathy   . Seizures ([place]) 04-05-13   one time ran out of Primidone and had stopped it cold [place]  . Tremor, essential    takes Mysoline and Neurontin daily.  . Vocal cord dysfunction   . Weakness    in left arm    Past Surgical History:  Procedure Laterality Date  . ABLATION FOR AVNRT    . BARTHOLIN GLAND CYST EXCISION     x 2  . BIOPSY  03/04/2015   Procedure: BIOPSY (Duodenal, Gastric);  Surgeon: Daneil Dolin, MD;  Location: [facility];  Service: Endoscopy;;  . CESAREAN SECTION    . CHOLECYSTECTOMY    . COLONOSCOPY  01/16/2004   VQQ:VZDGLO rectum/colon  . COLONOSCOPY WITH PROPOFOL N/A 03/04/2015   Dr.[name]- internal hemorrhoids, pancolonic diverticulosis, colonic polyp= tubular adenoma  . ESOPHAGEAL DILATION N/A 03/04/2015   Procedure: ESOPHAGEAL DILATION WITH 54FR MALONEY DILATOR;  Surgeon: Daneil Dolin, MD;  Location: [facility];  Service: Endoscopy;  Laterality: N/A;  . ESOPHAGOGASTRODUODENOSCOPY (EGD) WITH ESOPHAGEAL DILATION  04/03/2002   VFI:EPPIRJJO'A ring, otherwise normal esophagus, status post dilation with 56 F/Normal stomach  . ESOPHAGOGASTRODUODENOSCOPY (EGD) WITH ESOPHAGEAL DILATION N/A 11/08/2012   Dr. [name]:schatzki's ring s/p dilation/hiatal hernia  . ESOPHAGOGASTRODUODENOSCOPY (EGD) WITH PROPOFOL N/A 03/04/2015   Dr.[name]- schatzki's ring, hiatal hernia, scattered erosions bx= chronic inactive gastritis  . HEMORRHOID BANDING  2017   Dr.[name]  . LEFT ANKLE LIGAMENT REPAIR     x 2  . LEFT ELBOW REPAIR    . MAXIMUM ACCESS  (MAS)POSTERIOR LUMBAR INTERBODY FUSION (PLIF) 1 LEVEL N/A 04/24/2014   Procedure: Lumbar four-five Maximum access Surgery posterior lumbar interbody fusion;  Surgeon: [name], MD;  Location: [place] [facility];  Service: Neurosurgery;  Laterality: N/A;  Lumbar four-five Maximum access Surgery posterior lumbar interbody fusion  . PARTIAL HYSTERECTOMY    . POLYPECTOMY  03/04/2015   Procedure: POLYPECTOMY (descending colon);  Surgeon: Daneil Dolin, MD;  Location: [facility];  Service: Endoscopy;;  . PULSE GENERATOR IMPLANT Bilateral 12/15/2013   Procedure: BILATERAL PULSE GENERATOR IMPLANT;  Surgeon: [name], MD;  Location: [place] [facility];  Service:  Neurosurgery;  Laterality: Bilateral;  BILATERAL  . RIGHT BREAST CYST     benign  . SUBTHALAMIC STIMULATOR INSERTION Bilateral 12/04/2013   Procedure: Bilateral Deep brain stimulator placement;  Surgeon: [name], MD;  Location: [place] [facility];  Service: Neurosurgery;  Laterality: Bilateral;  Bilateral Deep brain stimulator placement  . TONSILLECTOMY      Social History   Social History  . Marital status: Married    Spouse name: N/A  . Number of children: N/A  . Years of education: N/A   Occupational History  . retired    Social History Main Topics  . Smoking status: Former Smoker    Packs/day: 0.30    Years: 10.00    Types: Cigarettes    Quit date: 04/23/1993  . Smokeless tobacco: Never Used     Comment: quit smoking 25+yrs ago  . Alcohol use No  . Drug use: No  . Sexual activity: No   Other Topics Concern  . Not on file   Social History Narrative   Married, 1 daughte, living.  Lives with husband in [place], [place].   1 child died brain tumor at age 29.   Two grandchildren.   Works at [employer], patient currently lost her job.   Retired 2011.   No tobacco.   Alcohol: none in 30 yrs.  Distant history of heavy use.   No drug use.     Vitals:   11/03/16 1106  BP: 122/70  Pulse: 74  SpO2: 96%  Weight: 176 lb (79.8  kg)  Height: 5' 4.5" (1.638 m)    Wt Readings from Last 3 Encounters:  11/03/16 176 lb (79.8 kg)  10/12/16 175 lb (79.4 kg)  08/27/16 171 lb 12.8 oz (77.9 kg)     PHYSICAL EXAM General: NAD HEENT: Tremor noted. Neck: No JVD, no thyromegaly. Lungs: Clear to auscultation bilaterally with normal respiratory effort. CV: Nondisplaced PMI.  Regular rate and rhythm, normal S1/S2, no S3/S4, no murmur. No pretibial or periankle edema.    Abdomen: Soft, nontender, no distention.  Neurologic: Alert and oriented.  Psych: Normal affect. Skin: Normal. Musculoskeletal: No gross deformities.    ECG: Most recent ECG reviewed.   Labs: Lab Results  Component Value Date/Time   K 4.4 05/21/2015 11:57 AM   BUN 12 05/21/2015 11:57 AM   CREATININE 0.77 05/21/2015 11:57 AM   CREATININE 0.70 12/27/2012 10:06 AM   ALT 41 04/08/2015 10:55 AM   TSH 1.01 05/21/2015 11:57 AM   HGB 12.9 04/08/2015 10:55 AM     Lipids: Lab Results  Component Value Date/Time   LDLCALC 80 02/13/2015 02:55 PM   CHOL 180 02/13/2015 02:55 PM   TRIG 175.0 (H) 02/13/2015 02:55 PM   HDL 64.80 02/13/2015 02:55 PM       ASSESSMENT AND PLAN:  1. 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 Continue metoprolol for tachycardia.  2. Essential HTN: BP controlled today. No changes.  3. Leg swelling: Due to chronic venous insufficiency. No edema today. Continue compression stocking use prn and leg elevation.      Disposition: Follow up 1 yr   Kate Sable, M.D., F.A.C.C.

## 2016-11-03 NOTE — Patient Instructions (Addendum)
Medication Instructions:  Continue all current medications.  Labwork: none  Testing/Procedures: none  Follow-Up: Your physician wants you to follow up in:  1 year.  You will receive a reminder letter in the mail one-two months in advance.  If you don't receive a letter, please call our office to schedule the follow up appointment   Any Other Special Instructions Will Be Listed Below (If Applicable). Compression stockings recommended today.    If you need a refill on your cardiac medications before your next appointment, please call your pharmacy.

## 2016-11-04 ENCOUNTER — Telehealth: Payer: Self-pay | Admitting: Internal Medicine

## 2016-11-04 DIAGNOSIS — S91104A Unspecified open wound of right lesser toe(s) without damage to nail, initial encounter: Secondary | ICD-10-CM | POA: Diagnosis not present

## 2016-11-04 DIAGNOSIS — J449 Chronic obstructive pulmonary disease, unspecified: Secondary | ICD-10-CM | POA: Diagnosis not present

## 2016-11-04 DIAGNOSIS — S330XXS Traumatic rupture of lumbar intervertebral disc, sequela: Secondary | ICD-10-CM | POA: Diagnosis not present

## 2016-11-04 DIAGNOSIS — J189 Pneumonia, unspecified organism: Secondary | ICD-10-CM | POA: Diagnosis not present

## 2016-11-04 DIAGNOSIS — G250 Essential tremor: Secondary | ICD-10-CM | POA: Diagnosis not present

## 2016-11-04 NOTE — Telephone Encounter (Signed)
Code is J45.40  Left a message for the phone provided above with the code needed for medication. Will close this message.

## 2016-11-05 DIAGNOSIS — F3163 Bipolar disorder, current episode mixed, severe, without psychotic features: Secondary | ICD-10-CM | POA: Diagnosis not present

## 2016-11-05 DIAGNOSIS — F4312 Post-traumatic stress disorder, chronic: Secondary | ICD-10-CM | POA: Diagnosis not present

## 2016-11-09 DIAGNOSIS — F3163 Bipolar disorder, current episode mixed, severe, without psychotic features: Secondary | ICD-10-CM | POA: Diagnosis not present

## 2016-11-13 DIAGNOSIS — F4312 Post-traumatic stress disorder, chronic: Secondary | ICD-10-CM | POA: Diagnosis not present

## 2016-11-13 DIAGNOSIS — F3163 Bipolar disorder, current episode mixed, severe, without psychotic features: Secondary | ICD-10-CM | POA: Diagnosis not present

## 2016-11-15 ENCOUNTER — Emergency Department (HOSPITAL_COMMUNITY)
Admission: EM | Admit: 2016-11-15 | Discharge: 2016-11-17 | Disposition: A | Discharge status: Other Acute Inpatient Hospital | Payer: Medicare Other | Attending: Emergency Medicine | Admitting: Emergency Medicine

## 2016-11-15 ENCOUNTER — Encounter (HOSPITAL_COMMUNITY): Payer: Self-pay | Admitting: *Deleted

## 2016-11-15 DIAGNOSIS — R451 Restlessness and agitation: Secondary | ICD-10-CM | POA: Diagnosis not present

## 2016-11-15 DIAGNOSIS — K5900 Constipation, unspecified: Secondary | ICD-10-CM | POA: Insufficient documentation

## 2016-11-15 DIAGNOSIS — Z046 Encounter for general psychiatric examination, requested by authority: Secondary | ICD-10-CM | POA: Diagnosis not present

## 2016-11-15 DIAGNOSIS — J45909 Unspecified asthma, uncomplicated: Secondary | ICD-10-CM | POA: Diagnosis not present

## 2016-11-15 DIAGNOSIS — R51 Headache: Secondary | ICD-10-CM | POA: Diagnosis not present

## 2016-11-15 DIAGNOSIS — Z91040 Latex allergy status: Secondary | ICD-10-CM | POA: Insufficient documentation

## 2016-11-15 DIAGNOSIS — E119 Type 2 diabetes mellitus without complications: Secondary | ICD-10-CM | POA: Diagnosis not present

## 2016-11-15 DIAGNOSIS — R45851 Suicidal ideations: Secondary | ICD-10-CM | POA: Diagnosis not present

## 2016-11-15 DIAGNOSIS — Z7982 Long term (current) use of aspirin: Secondary | ICD-10-CM | POA: Diagnosis not present

## 2016-11-15 DIAGNOSIS — Z87891 Personal history of nicotine dependence: Secondary | ICD-10-CM | POA: Diagnosis not present

## 2016-11-15 DIAGNOSIS — E039 Hypothyroidism, unspecified: Secondary | ICD-10-CM | POA: Diagnosis not present

## 2016-11-15 DIAGNOSIS — Z794 Long term (current) use of insulin: Secondary | ICD-10-CM | POA: Diagnosis not present

## 2016-11-15 DIAGNOSIS — J690 Pneumonitis due to inhalation of food and vomit: Secondary | ICD-10-CM | POA: Diagnosis not present

## 2016-11-15 DIAGNOSIS — I10 Essential (primary) hypertension: Secondary | ICD-10-CM | POA: Insufficient documentation

## 2016-11-15 DIAGNOSIS — F29 Unspecified psychosis not due to a substance or known physiological condition: Secondary | ICD-10-CM | POA: Diagnosis not present

## 2016-11-15 DIAGNOSIS — Z79899 Other long term (current) drug therapy: Secondary | ICD-10-CM | POA: Insufficient documentation
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
## 2016-11-16 ENCOUNTER — Emergency Department (HOSPITAL_COMMUNITY): Payer: Medicare Other

## 2016-11-16 DIAGNOSIS — J690 Pneumonitis due to inhalation of food and vomit: Secondary | ICD-10-CM | POA: Diagnosis not present

## 2016-11-16 DIAGNOSIS — R451 Restlessness and agitation: Secondary | ICD-10-CM | POA: Diagnosis not present

## 2016-11-16 LAB — URINALYSIS, ROUTINE W REFLEX MICROSCOPIC
Bacteria, UA: NONE SEEN
Bilirubin Urine: NEGATIVE
Glucose, UA: >=500 mg/dL — AB
Hgb urine dipstick: NEGATIVE
Ketones, ur: NEGATIVE mg/dL
Leukocytes, UA: NEGATIVE
Nitrite: NEGATIVE
Protein, ur: NEGATIVE mg/dL
RBC / HPF: NONE SEEN RBC/hpf (ref 0–5)
Specific Gravity, Urine: 1.006 (ref 1.005–1.030)
pH: 6 (ref 5.0–8.0)

## 2016-11-16 LAB — CBC WITH DIFFERENTIAL/PLATELET
Basophils Absolute: 0 10*3/uL (ref 0.0–0.1)
Basophils Relative: 0 %
Eosinophils Absolute: 0.2 10*3/uL (ref 0.0–0.7)
Eosinophils Relative: 3 %
HCT: 38.4 % (ref 36.0–46.0)
Hemoglobin: 13.2 g/dL (ref 12.0–15.0)
Lymphocytes Relative: 26 %
Lymphs Abs: 2 10*3/uL (ref 0.7–4.0)
MCH: 30.8 pg (ref 26.0–34.0)
MCHC: 34.4 g/dL (ref 30.0–36.0)
MCV: 89.7 fL (ref 78.0–100.0)
Monocytes Absolute: 0.4 10*3/uL (ref 0.1–1.0)
Monocytes Relative: 5 %
Neutro Abs: 5.1 10*3/uL (ref 1.7–7.7)
Neutrophils Relative %: 66 %
Platelets: 133 10*3/uL — ABNORMAL LOW (ref 150–400)
RBC: 4.28 MIL/uL (ref 3.87–5.11)
RDW: 12.6 % (ref 11.5–15.5)
WBC: 7.8 10*3/uL (ref 4.0–10.5)

## 2016-11-16 LAB — COMPREHENSIVE METABOLIC PANEL
ALT: 17 U/L (ref 14–54)
AST: 18 U/L (ref 15–41)
Albumin: 4 g/dL (ref 3.5–5.0)
Alkaline Phosphatase: 94 U/L (ref 38–126)
Anion gap: 10 (ref 5–15)
BUN: 18 mg/dL (ref 6–20)
CO2: 25 mmol/L (ref 22–32)
Calcium: 9.2 mg/dL (ref 8.9–10.3)
Chloride: 106 mmol/L (ref 101–111)
Creatinine, Ser: 0.82 mg/dL (ref 0.44–1.00)
GFR calc Af Amer: >60 mL/min (ref >60)
GFR calc non Af Amer: >60 mL/min (ref >60)
Glucose, Bld: 318 mg/dL — ABNORMAL HIGH (ref 65–99)
Potassium: 3.7 mmol/L (ref 3.5–5.1)
Sodium: 141 mmol/L (ref 135–145)
Total Bilirubin: 0.5 mg/dL (ref 0.3–1.2)
Total Protein: 6.8 g/dL (ref 6.5–8.1)

## 2016-11-16 LAB — RAPID URINE DRUG SCREEN, HOSP PERFORMED
Amphetamines: NOT DETECTED
Barbiturates: POSITIVE — AB
Benzodiazepines: NOT DETECTED
Cocaine: NOT DETECTED
Opiates: NOT DETECTED
Tetrahydrocannabinol: NOT DETECTED

## 2016-11-16 LAB — CBG MONITORING, ED
Glucose-Capillary: 276 mg/dL — ABNORMAL HIGH (ref 65–99)
Glucose-Capillary: 190 mg/dL — ABNORMAL HIGH (ref 65–99)
Glucose-Capillary: 253 mg/dL — ABNORMAL HIGH (ref 65–99)
Glucose-Capillary: 273 mg/dL — ABNORMAL HIGH (ref 65–99)

## 2016-11-16 LAB — ETHANOL: Alcohol, Ethyl (B): <5 mg/dL (ref <5)

## 2016-11-16 MED ORDER — ALUM & MAG HYDROXIDE-SIMETH 200-200-20 MG/5ML PO SUSP
30.0000 mL | Freq: Four times a day (QID) | ORAL | Status: DC | PRN
  Administered 2016-11-16: 30 mL via ORAL
  Filled 2016-11-16: qty 30

## 2016-11-16 MED ORDER — HYDROCODONE-ACETAMINOPHEN 5-325 MG PO TABS
1.0000 | ORAL_TABLET | Freq: Every day | ORAL | Status: DC | PRN
  Administered 2016-11-16: 1 via ORAL
  Filled 2016-11-16: qty 1

## 2016-11-16 MED ORDER — GABAPENTIN 300 MG PO CAPS
600.0000 mg | ORAL_CAPSULE | Freq: Three times a day (TID) | ORAL | Status: DC
  Administered 2016-11-16 – 2016-11-17 (×5): 600 mg via ORAL
  Filled 2016-11-16 (×5): qty 2

## 2016-11-16 MED ORDER — FLUTICASONE FUROATE-VILANTEROL 100-25 MCG/INH IN AEPB
1.0000 | INHALATION_SPRAY | Freq: Every day | RESPIRATORY_TRACT | Status: DC
  Administered 2016-11-16: 09:00:00 1 via RESPIRATORY_TRACT
  Filled 2016-11-16: qty 28

## 2016-11-16 MED ORDER — ASPIRIN EC 81 MG PO TBEC
81.0000 mg | DELAYED_RELEASE_TABLET | Freq: Every day | ORAL | Status: DC
  Administered 2016-11-16 – 2016-11-17 (×2): 81 mg via ORAL
  Filled 2016-11-16 (×2): qty 1

## 2016-11-16 MED ORDER — OXYCODONE HCL ER 10 MG PO T12A
10.0000 mg | EXTENDED_RELEASE_TABLET | Freq: Two times a day (BID) | ORAL | Status: DC
  Administered 2016-11-16 – 2016-11-17 (×3): 10 mg via ORAL
  Filled 2016-11-16 (×4): qty 1

## 2016-11-16 MED ORDER — VILAZODONE HCL 40 MG PO TABS
40.0000 mg | ORAL_TABLET | Freq: Every day | ORAL | Status: DC
  Administered 2016-11-16 – 2016-11-17 (×2): 40 mg via ORAL
  Filled 2016-11-16 (×3): qty 1

## 2016-11-16 MED ORDER — LAMOTRIGINE 25 MG PO TABS
100.0000 mg | ORAL_TABLET | Freq: Two times a day (BID) | ORAL | Status: DC
  Administered 2016-11-16 – 2016-11-17 (×3): 100 mg via ORAL
  Filled 2016-11-16 (×3): qty 4

## 2016-11-16 MED ORDER — IPRATROPIUM-ALBUTEROL 0.5-2.5 (3) MG/3ML IN SOLN: 3.0000 mL | RESPIRATORY_TRACT | Status: DC | PRN

## 2016-11-16 MED ORDER — PANTOPRAZOLE SODIUM 40 MG PO TBEC
40.0000 mg | DELAYED_RELEASE_TABLET | Freq: Every day | ORAL | Status: DC
  Administered 2016-11-16 – 2016-11-17 (×2): 40 mg via ORAL
  Filled 2016-11-16 (×2): qty 1

## 2016-11-16 MED ORDER — METOPROLOL TARTRATE 50 MG PO TABS
50.0000 mg | ORAL_TABLET | Freq: Every day | ORAL | Status: DC
  Administered 2016-11-16 – 2016-11-17 (×2): 50 mg via ORAL
  Filled 2016-11-16 (×2): qty 1

## 2016-11-16 MED ORDER — IPRATROPIUM-ALBUTEROL 0.5-2.5 (3) MG/3ML IN SOLN: 3.0000 mL | RESPIRATORY_TRACT | Status: DC

## 2016-11-16 MED ORDER — INSULIN ASPART 100 UNIT/ML ~~LOC~~ SOLN
5.0000 [IU] | Freq: Once | SUBCUTANEOUS | Status: AC
  Administered 2016-11-16: 5 [IU] via SUBCUTANEOUS
  Filled 2016-11-16: qty 1

## 2016-11-16 MED ORDER — LEVOTHYROXINE SODIUM 50 MCG PO TABS
75.0000 ug | ORAL_TABLET | Freq: Every day | ORAL | Status: DC
Start: 2016-11-16 — End: 2016-11-17
  Administered 2016-11-16 – 2016-11-17 (×2): 75 ug via ORAL
  Filled 2016-11-16 (×2): qty 2

## 2016-11-16 NOTE — Progress Notes (Signed)
Inpatient Diabetes Program Recommendations  AACE/ADA: New Consensus Statement on Inpatient Glycemic Control (2015)  Target Ranges:  Prepandial:   less than 140 mg/dL      Peak postprandial:   less than 180 mg/dL (1-2 hours)      Critically ill patients:  140 - 180 mg/dL  Results for [name], [name] (MRN [id number]) as of 11/16/2016 07:44  Ref. Range 11/16/2016 02:31 11/16/2016 07:23  Glucose-Capillary Latest Ref Range: 65 - 99 mg/dL 276 (H) 190 (H)   Review of Glycemic Control  Diabetes history: DM2 Outpatient Diabetes medications: Basaglar (no documented dose or frequency on home med list) Current orders for Inpatient glycemic control: None  Inpatient Diabetes Program Recommendations: Insulin - Basal: Please consider ordeirng Lantus 8 units daily (based on 81 kg x 0.1 units). Correction (SSI): Please consider ordering CBGs with Novolog 0-9 units TID with meals and Novolog 0-5 units QHS. Diet: Please discontinue Regular diet and order Carb Modified.  Thanks, Barnie Alderman, RN, MSN, CDE Diabetes Coordinator Inpatient Diabetes Program [phone] (Team Pager from 8am to 5pm)

## 2016-11-16 NOTE — ED Notes (Signed)
Pt updated on plan of care, sitter remains at bedside,  

## 2016-11-16 NOTE — ED Notes (Signed)
Lab at bedside for blood draw,

## 2016-11-16 NOTE — ED Notes (Signed)
Tele psych being performed at present time,
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
## 2016-11-16 NOTE — ED Notes (Signed)
ED Provider at bedside. 

## 2016-11-16 NOTE — ED Notes (Signed)
Pt resting with eyes closed, sitter remains at bedside,

## 2016-11-16 NOTE — ED Notes (Signed)
Pt and pt's sitter, Maurine Simmering NT, both aware of need for urine sample. Pt will provide one when able

## 2016-11-16 NOTE — Progress Notes (Signed)
Patient meets criteria for inpatient treatment. CSW faxed referrals to the following inpatient facilities for review:  [facility], [facility], [facility], [place], [place]   TTS will continue to seek bed placement.   Radonna Ricker MSW, [place] Disposition [phone]

## 2016-11-16 NOTE — Progress Notes (Signed)
Patient asleep neb order changed to PRN, patient in for psych problems not her Asthma. Nebs are only for wheezing or sob.
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
## 2016-11-17 DIAGNOSIS — R451 Restlessness and agitation: Secondary | ICD-10-CM | POA: Diagnosis not present

## 2016-11-17 LAB — CBG MONITORING, ED
Glucose-Capillary: 227 mg/dL — ABNORMAL HIGH (ref 65–99)
Glucose-Capillary: 306 mg/dL — ABNORMAL HIGH (ref 65–99)
Glucose-Capillary: 182 mg/dL — ABNORMAL HIGH (ref 65–99)

## 2016-11-17 MED ORDER — BACITRACIN-NEOMYCIN-POLYMYXIN 400-5-5000 EX OINT
TOPICAL_OINTMENT | CUTANEOUS | Status: AC
  Filled 2016-11-17: qty 1

## 2016-11-17 MED ORDER — INSULIN ASPART 100 UNIT/ML ~~LOC~~ SOLN
8.0000 [IU] | Freq: Three times a day (TID) | SUBCUTANEOUS | Status: DC
  Administered 2016-11-17 (×2): 8 [IU] via SUBCUTANEOUS
  Filled 2016-11-17 (×2): qty 1

## 2016-11-17 MED ORDER — INSULIN GLARGINE 100 UNIT/ML ~~LOC~~ SOLN
8.0000 [IU] | Freq: Every day | SUBCUTANEOUS | Status: DC
  Administered 2016-11-17: 8 [IU] via SUBCUTANEOUS
  Filled 2016-11-17 (×2): qty 0.08

## 2016-11-17 MED ORDER — ONDANSETRON HCL 4 MG PO TABS: 4.0000 mg | ORAL_TABLET | Freq: Three times a day (TID) | ORAL | Status: DC | PRN
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
## 2016-11-17 NOTE — Progress Notes (Signed)
CSW faxed the pt's referral to [facility] for review.  Lind Covert, MSW, [place] Disposition [phone]

## 2016-11-17 NOTE — Progress Notes (Signed)
Pt accepted to [facility], Bed 147-1 per Gerald Stabs, Intake Coordinator.   Dr. Launa Grill accepting.   Nurse to Nurse report to [phone].   Patient is voluntary.   Patient may be transported by [facility] but must arrive before 11 PM.  [facility] ED Nurse, Dewitt Hoes, notified.  Areatha Keas. Judi Cong, MSW, [place] Disposition Clinical Social Work [phone] (cell) [phone] (office)

## 2016-11-17 NOTE — Progress Notes (Signed)
CSW faxed referral to [facility] for review.  Lind Covert, MSW, [place] Disposition [phone]

## 2016-11-18 DIAGNOSIS — F315 Bipolar disorder, current episode depressed, severe, with psychotic features: Secondary | ICD-10-CM | POA: Diagnosis not present

## 2016-11-19 DIAGNOSIS — F315 Bipolar disorder, current episode depressed, severe, with psychotic features: Secondary | ICD-10-CM | POA: Diagnosis not present

## 2016-11-20 DIAGNOSIS — F315 Bipolar disorder, current episode depressed, severe, with psychotic features: Secondary | ICD-10-CM | POA: Diagnosis not present

## 2016-11-21 DIAGNOSIS — F315 Bipolar disorder, current episode depressed, severe, with psychotic features: Secondary | ICD-10-CM | POA: Diagnosis not present

## 2016-11-22 DIAGNOSIS — F315 Bipolar disorder, current episode depressed, severe, with psychotic features: Secondary | ICD-10-CM | POA: Diagnosis not present

## 2016-11-23 DIAGNOSIS — F315 Bipolar disorder, current episode depressed, severe, with psychotic features: Secondary | ICD-10-CM | POA: Diagnosis not present

## 2016-11-24 DIAGNOSIS — F315 Bipolar disorder, current episode depressed, severe, with psychotic features: Secondary | ICD-10-CM | POA: Diagnosis not present

## 2016-11-25 DIAGNOSIS — F315 Bipolar disorder, current episode depressed, severe, with psychotic features: Secondary | ICD-10-CM | POA: Diagnosis not present

## 2016-11-26 DIAGNOSIS — F3163 Bipolar disorder, current episode mixed, severe, without psychotic features: Secondary | ICD-10-CM | POA: Diagnosis not present

## 2016-11-26 DIAGNOSIS — M5416 Radiculopathy, lumbar region: Secondary | ICD-10-CM | POA: Diagnosis not present

## 2016-11-26 DIAGNOSIS — F4312 Post-traumatic stress disorder, chronic: Secondary | ICD-10-CM | POA: Diagnosis not present

## 2016-11-26 DIAGNOSIS — M4120 Other idiopathic scoliosis, site unspecified: Secondary | ICD-10-CM | POA: Diagnosis not present

## 2016-12-01 ENCOUNTER — Ambulatory Visit: Payer: Self-pay | Admitting: Internal Medicine

## 2016-12-02 ENCOUNTER — Ambulatory Visit: Payer: Self-pay | Admitting: Nurse Practitioner

## 2016-12-03 ENCOUNTER — Telehealth: Payer: Self-pay | Admitting: Internal Medicine

## 2016-12-03 DIAGNOSIS — F4312 Post-traumatic stress disorder, chronic: Secondary | ICD-10-CM | POA: Diagnosis not present

## 2016-12-03 DIAGNOSIS — F3163 Bipolar disorder, current episode mixed, severe, without psychotic features: Secondary | ICD-10-CM | POA: Diagnosis not present

## 2016-12-03 NOTE — Telephone Encounter (Signed)
Pt scheduled for 12/11/16 at 11:30 with CY Unable to do any other time earlier in the week.  Nothing further needed.

## 2016-12-03 NOTE — Telephone Encounter (Signed)
Called and spoke with [name] with [facility]. [name] states pt is having more asthma flares and is requesting to be seen sooner then 01/13/17. I have offered an appointment with CY on 12/08/16 with CY. [name] declined, as transportation is  full that day.   CY please advise on sooner apt. Thanks.

## 2016-12-03 NOTE — Telephone Encounter (Signed)
We can plug her into an RN slot sometime in the next week.

## 2016-12-04 DIAGNOSIS — K5900 Constipation, unspecified: Secondary | ICD-10-CM | POA: Diagnosis not present

## 2016-12-04 DIAGNOSIS — S330XXS Traumatic rupture of lumbar intervertebral disc, sequela: Secondary | ICD-10-CM | POA: Diagnosis not present

## 2016-12-04 DIAGNOSIS — J42 Unspecified chronic bronchitis: Secondary | ICD-10-CM | POA: Diagnosis not present

## 2016-12-04 DIAGNOSIS — K267 Chronic duodenal ulcer without hemorrhage or perforation: Secondary | ICD-10-CM | POA: Diagnosis not present

## 2016-12-09 ENCOUNTER — Ambulatory Visit (INDEPENDENT_AMBULATORY_CARE_PROVIDER_SITE_OTHER): Payer: Medicare Other | Admitting: Nurse Practitioner

## 2016-12-09 ENCOUNTER — Encounter: Payer: Self-pay | Admitting: Nurse Practitioner

## 2016-12-09 VITALS — BP 116/77 | HR 85 | Temp 98.1°F | Ht 65.0 in

## 2016-12-09 DIAGNOSIS — E559 Vitamin D deficiency, unspecified: Secondary | ICD-10-CM | POA: Diagnosis not present

## 2016-12-09 DIAGNOSIS — M810 Age-related osteoporosis without current pathological fracture: Secondary | ICD-10-CM | POA: Diagnosis not present

## 2016-12-09 DIAGNOSIS — K219 Gastro-esophageal reflux disease without esophagitis: Secondary | ICD-10-CM

## 2016-12-09 DIAGNOSIS — E1165 Type 2 diabetes mellitus with hyperglycemia: Secondary | ICD-10-CM | POA: Diagnosis not present

## 2016-12-09 DIAGNOSIS — E310 Autoimmune polyglandular failure: Secondary | ICD-10-CM | POA: Diagnosis not present

## 2016-12-09 DIAGNOSIS — K581 Irritable bowel syndrome with constipation: Secondary | ICD-10-CM

## 2016-12-09 DIAGNOSIS — E119 Type 2 diabetes mellitus without complications: Secondary | ICD-10-CM | POA: Diagnosis not present

## 2016-12-09 DIAGNOSIS — D649 Anemia, unspecified: Secondary | ICD-10-CM | POA: Diagnosis not present

## 2016-12-09 DIAGNOSIS — E039 Hypothyroidism, unspecified: Secondary | ICD-10-CM | POA: Diagnosis not present

## 2016-12-09 MED ORDER — OMEPRAZOLE 20 MG PO CPDR: 20.0000 mg | DELAYED_RELEASE_CAPSULE | Freq: Two times a day (BID) | ORAL | 3 refills | Status: DC

## 2016-12-09 NOTE — Progress Notes (Signed)
Referring Provider: Abran Richard, MD Primary Care Physician:  Abran Richard, MD Primary GI:  Dr. Gala Romney  Chief Complaint  Patient presents with  . "Ulcers"    HPI:   [name] is a 66 y.o. female who presents for "ulcers" By urgent care. Urgent care notes reviewed where they indicated the patient has a history of duodenal ulcer. At the time of her visit with them 10/05/2016 she noted increased abdominal discomfort makes her feel like "her duodenal ulcer is acting up again." She is compliant with her Carafate twice a day order. Has to strain with bowel movement and requesting prescription for MiraLAX and Metamucil to help.   The patient was last seen in our office 05/03/2015 for GERD and constipation. GERD noted much improved on Protonix 40 mg daily, also with Carafate before meals and at bedtime. Uses MiraLAX every day for constipation. He underwent hemorrhoid banding at that time as well. Recommended avoid straining, Benefiber twice a day, limited toilet time for 2-5 minutes, continue Protonix, continue Carafate, take MiraLAX daily as needed, call with any follow-up problems.  Upon review of her documented past medical history and all of her upper endoscopic procedures a cannot find a history of duodenal ulcer anywhere in her chart.  Last EGD completed 03/04/2015 for dysphagia and refractory GERD symptoms which found noncritical [name]'s ring status post dilation, hiatal hernia, abnormal stomach and duodenum status post biopsy. Surgical pathology found the biopsies of the stomach to be chronic inactive gastritis. The biopsies of the duodenum were found to be duodenum because it with regenerative changes. Recommended continue Protonix 40 mg daily.  Today he states she cannot remember who told her she had an ulcer. She has a lot of reflux and belching. Drinks 1-2 sodas a month. Drinks 4 cups of coffee a day. Denies NSAIDs, ASA powders. Admits abdominal pain in the epigastric  region. Has esophageal/throat burning. Denies hematochezia, melena, fever, chills, unintentional weights loss. Has constipation on pain medications, currently on Miralax. Has a bowel movement about every 5 days, straining, hard stools when not taking Miralax, but hasn't had any since 3 weeks ago due to hospitalization (1.5 weeks) and follow-up appointments. Denies chest pain, dyspnea, dizziness, lightheadedness, syncope, near syncope. Denies any other upper or lower GI symptoms.  States Yogurt helps reflux a lot as well.  Past Medical History:  Diagnosis Date  . Allergic rhinitis    uses Nasonex daily as needed  . Allergy to angiotensin receptor blockers (ARB) 01/22/2014   Angioedema  . Anxiety    takes Ativan daily as needed  . Aspiration pneumonia ([place])   . Asthma    Albuterol inhaler and Neb daily as needed  . 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 [name], residual palpitations  . Chronic back pain    reason unknown  . Constipation   . DDD (degenerative disc disease), lumbar   . Depression    takes Zoloft daily as well as Cymbalta  . Diabetes mellitus    takes NOvolin daily  . Dysphagia   . Dysrhythmia    SVT ==ablation done by Dr. Rayann Heman 2007  . Esophageal reflux    takes Zantac daily  . History of bronchitis Dec 2014  . Hypertension    takes Losartan daily  . Hypothyroidism    takes Synthroid daily  . Joint pain   . Joint swelling    left ankle  . Low sodium syndrome   .  Lumbar radiculopathy   . Seizures ([place]) 04-05-13   one time ran out of Primidone and had stopped it cold [place]  . Tremor, essential    takes Mysoline and Neurontin daily.  . Vocal cord dysfunction   . Weakness    in left arm    Past Surgical History:  Procedure Laterality Date  . ABLATION FOR AVNRT    . BARTHOLIN GLAND CYST EXCISION     x 2  . BIOPSY  03/04/2015   Procedure: BIOPSY (Duodenal, Gastric);  Surgeon: Daneil Dolin, MD;  Location:  [facility];  Service: Endoscopy;;  . CESAREAN SECTION    . CHOLECYSTECTOMY    . COLONOSCOPY  01/16/2004   VHQ:IONGEX rectum/colon  . COLONOSCOPY WITH PROPOFOL N/A 03/04/2015   Dr.[name]- internal hemorrhoids, pancolonic diverticulosis, colonic polyp= tubular adenoma  . ESOPHAGEAL DILATION N/A 03/04/2015   Procedure: ESOPHAGEAL DILATION WITH 54FR MALONEY DILATOR;  Surgeon: Daneil Dolin, MD;  Location: [facility];  Service: Endoscopy;  Laterality: N/A;  . ESOPHAGOGASTRODUODENOSCOPY (EGD) WITH ESOPHAGEAL DILATION  04/03/2002   BMW:[id number] ring, otherwise normal esophagus, status post dilation with 56 F/Normal stomach  . ESOPHAGOGASTRODUODENOSCOPY (EGD) WITH ESOPHAGEAL DILATION N/A 11/08/2012   Dr. [name]:schatzki's ring s/p dilation/hiatal hernia  . ESOPHAGOGASTRODUODENOSCOPY (EGD) WITH PROPOFOL N/A 03/04/2015   Dr.[name]- schatzki's ring, hiatal hernia, scattered erosions bx= chronic inactive gastritis  . HEMORRHOID BANDING  2017   Dr.[name]  . LEFT ANKLE LIGAMENT REPAIR     x 2  . LEFT ELBOW REPAIR    . MAXIMUM ACCESS (MAS)POSTERIOR LUMBAR INTERBODY FUSION (PLIF) 1 LEVEL N/A 04/24/2014   Procedure: Lumbar four-five Maximum access Surgery posterior lumbar interbody fusion;  Surgeon: [name], MD;  Location: [place] [facility];  Service: Neurosurgery;  Laterality: N/A;  Lumbar four-five Maximum access Surgery posterior lumbar interbody fusion  . PARTIAL HYSTERECTOMY    . POLYPECTOMY  03/04/2015   Procedure: POLYPECTOMY (descending colon);  Surgeon: Daneil Dolin, MD;  Location: [facility];  Service: Endoscopy;;  . PULSE GENERATOR IMPLANT Bilateral 12/15/2013   Procedure: BILATERAL PULSE GENERATOR IMPLANT;  Surgeon: [name], MD;  Location: [place] [facility];  Service: Neurosurgery;  Laterality: Bilateral;  BILATERAL  . RIGHT BREAST CYST     benign  . SUBTHALAMIC STIMULATOR INSERTION Bilateral 12/04/2013   Procedure: Bilateral Deep brain stimulator placement;  Surgeon: [name], MD;  Location: [place] [facility]  [facility];  Service: Neurosurgery;  Laterality: Bilateral;  Bilateral Deep brain stimulator placement  . TONSILLECTOMY      Current Outpatient Prescriptions  Medication Sig Dispense Refill  . aspirin EC 81 MG tablet Take 81 mg by mouth daily.    . fesoterodine (TOVIAZ) 8 MG TB24 tablet Take 8 mg by mouth daily.    . Fluticasone Furoate-Vilanterol (BREO ELLIPTA) 100-25 MCG/INH AEPB INHALE 1 PUFF INTO LUNGS ONCE DAILY. 60 each 5  . gabapentin (NEURONTIN) 600 MG tablet Take 600 mg by mouth 3 (three) times daily.    Marland Kitchen HYDROcodone-acetaminophen (NORCO/VICODIN) 5-325 MG tablet Take 1 tablet by mouth daily as needed.     . Insulin Glargine (BASAGLAR KWIKPEN) 100 UNIT/ML SOPN Inject 36 Units into the skin daily at 10 pm.     . ipratropium (ATROVENT) 0.03 % nasal spray 1-2 puffs each nostril up to 3 times daily, before meals 30 mL 12  . ipratropium-albuterol (DUONEB) 0.5-2.5 (3) MG/3ML SOLN 1 neb every 4-6 hours for wheeze/ cough 360 mL 12  . lamoTRIgine (LAMICTAL) 100 MG tablet Take 100 mg by mouth 2 (  two) times daily. 100 mg in the morning, 50 mg at night    . levothyroxine (SYNTHROID, LEVOTHROID) 75 MCG tablet TAKE (1) TABLET BY MOUTH ONCE DAILY BEFORE BREAKFAST. 30 tablet 5  . metoprolol (LOPRESSOR) 50 MG tablet Take 1 tablet by mouth daily.    . Multiple Vitamins-Minerals (MULTIVITAMINS THER. W/MINERALS) TABS tablet TAKE ONE TABLET BY MOUTH ONCE DAILY. 30 each 11  . NOVOLOG FLEXPEN 100 UNIT/ML FlexPen Inject 12 Units into the skin 3 (three) times daily with meals. Sliding scale    . Omega 3 1000 MG CAPS TAKE (1) CAPSULE BY MOUTH ONCE DAILY. 30 capsule 11  . omeprazole (PRILOSEC) 20 MG capsule Take 20 mg by mouth daily.    . OXYCONTIN 10 MG 12 hr tablet Take 10 mg by mouth every 12 (twelve) hours.     . primidone (MYSOLINE) 50 MG tablet Take 2 tablets (100 mg total) by mouth 2 (two) times daily. (Patient taking differently: Take by mouth. 100 am, 50 hs) 360 tablet 1  . sucralfate (CARAFATE) 1 g tablet      . tiZANidine (ZANAFLEX) 4 MG tablet TAKE 1 TABLET BY MOUTH AT BEDTIME AS NEEDED FOR MUSCLE SPASMS. 30 tablet 11  . Vilazodone HCl (VIIBRYD) 40 MG TABS Take 40 mg by mouth daily.    Marland Kitchen loratadine (CLARITIN) 10 MG tablet Take 10 mg by mouth daily.     Current Facility-Administered Medications  Medication Dose Route Frequency Provider Last Rate Last Dose  . levalbuterol (XOPENEX) nebulizer solution 0.63 mg  0.63 mg Nebulization Once Baird Lyons D, MD        Allergies as of 12/09/2016 - Review Complete 12/09/2016  Allergen Reaction Noted  . Invokana [canagliflozin] Other (See Comments) 12/05/2014  . Losartan  01/22/2014  . Metformin and related Diarrhea 01/18/2013  . Latex Other (See Comments) 07/02/2014  . Oxycodone-acetaminophen Itching 05/11/2011    Family History  Problem Relation Age of Onset  . Lung cancer Father        DIED AGE 27 LUNG CA  . Alcohol abuse Father   . Anxiety disorder Father   . Depression Father   . COPD Mother        DIED AGE 31,MRSA,COPD,PNEUMONIA  . Pneumonia Mother        DIED AGE 31,MRSA,COPD,PNEUMONIA  . Supraventricular tachycardia Mother   . Anxiety disorder Mother   . Depression Mother   . Alcohol abuse Mother   . Heart disease Sister        DIED AGE 52, SMOKER,?HEART  . COPD Brother        AGE 89  . Colon cancer Neg Hx     Social History   Social History  . Marital status: Married    Spouse name: N/A  . Number of children: N/A  . Years of education: N/A   Occupational History  . retired    Social History Main Topics  . Smoking status: Former Smoker    Packs/day: 0.30    Years: 10.00    Types: Cigarettes    Quit date: 04/23/1993  . Smokeless tobacco: Never Used     Comment: quit smoking 25+yrs ago  . Alcohol use No  . Drug use: No  . Sexual activity: No   Other Topics Concern  . Not on file   Social History Narrative   Married, 1 daughte, living.  Lives with husband in [place], [place].   1 child died brain tumor at age 9.    Two grandchildren.  Works at [employer], patient currently lost her job.   Retired 2011.   No tobacco.   Alcohol: none in 30 yrs.  Distant history of heavy use.   No drug use.    Review of Systems: General: Negative for anorexia, weight loss, fever, chills, fatigue, weakness. Eyes: Negative for vision changes.  ENT: Negative for hoarseness, difficulty swallowing , nasal congestion. CV: Negative for chest pain, angina, palpitations, dyspnea on exertion, peripheral edema.  Respiratory: Negative for dyspnea at rest, dyspnea on exertion, cough, sputum, wheezing.  GI: See history of present illness. GU:  Negative for dysuria, hematuria, urinary incontinence, urinary frequency, nocturnal urination.  MS: Negative for joint pain, low back pain.  Derm: Negative for rash or itching.  Neuro: Negative for weakness, abnormal sensation, seizure, frequent headaches, memory loss, confusion.  Psych: Negative for anxiety, depression, suicidal ideation, hallucinations.  Endo: Negative for unusual weight change.  Heme: Negative for bruising or bleeding. Allergy: Negative for rash or hives.   Physical Exam: BP 116/77   Pulse 85   Temp 98.1 F (36.7 C) (Oral)   Ht 5\' 5"  (1.651 m)  General:   Alert and oriented. Pleasant and cooperative. Well-nourished and well-developed.  Head:  Normocephalic and atraumatic. Eyes:  Without icterus, sclera clear and conjunctiva pink.  Ears:  Normal auditory acuity. Mouth:  No deformity or lesions, oral mucosa pink.  Throat/Neck:  Supple, without mass or thyromegaly. Cardiovascular:  S1, S2 present without murmurs appreciated. Normal pulses noted. Extremities without clubbing or edema. Respiratory:  Clear to auscultation bilaterally. No wheezes, rales, or rhonchi. No distress.  Gastrointestinal:  +BS, soft, non-tender and non-distended. No HSM noted. No guarding or rebound. No masses appreciated.  Rectal:  Deferred  Musculoskalatal:  Symmetrical  without gross deformities. Normal posture. Skin:  Intact without significant lesions or rashes. Neurologic:  Alert and oriented x4;  grossly normal neurologically. Psych:  Alert and cooperative. Normal mood and affect. Heme/Lymph/Immune: No significant cervical adenopathy. No excessive bruising noted.    12/09/2016 3:30 PM   Disclaimer: This note was dictated with voice recognition software. Similar sounding words can inadvertently be transcribed and may not be corrected upon review.

## 2016-12-09 NOTE — Assessment & Plan Note (Addendum)
The patient describes worsening GERD symptoms. She does drink substantial amounts of coffee which could be worsening her symptoms. Denies NSAIDs and aspirin powders. She is currently on Protonix 20 mg once a day. I'll increase this to 20 mg twice a day. Continue using Carafate as well. Return for follow-up in 3 months.

## 2016-12-09 NOTE — Patient Instructions (Addendum)
1. I am increasing your Omeprazole (Prilosec) to twice daily (once in the morning and once in the evening) to see if this helps your reflux symptoms. 2. Take Miralax every day 3. Follow-up here in 3 months.

## 2016-12-09 NOTE — Assessment & Plan Note (Signed)
Obstipation normally well controlled with MiraLAX once a day. She has not had MiraLAX in 3 weeks due to 1-1/2 weeks of hospitalization and she is unsure why she hasn't had it since. Recommend MiraLAX every day. This does not work for her the facility can call into our office and we can recommend additional treatment options. Return for follow-up in 3 months.

## 2016-12-10 DIAGNOSIS — F3163 Bipolar disorder, current episode mixed, severe, without psychotic features: Secondary | ICD-10-CM | POA: Diagnosis not present

## 2016-12-10 DIAGNOSIS — F4312 Post-traumatic stress disorder, chronic: Secondary | ICD-10-CM | POA: Diagnosis not present

## 2016-12-10 NOTE — Progress Notes (Signed)
cc'ed to pcp °

## 2016-12-11 ENCOUNTER — Encounter: Payer: Self-pay | Admitting: Internal Medicine

## 2016-12-11 ENCOUNTER — Telehealth: Payer: Self-pay | Admitting: Internal Medicine

## 2016-12-11 ENCOUNTER — Ambulatory Visit (INDEPENDENT_AMBULATORY_CARE_PROVIDER_SITE_OTHER): Payer: Medicare Other | Admitting: Internal Medicine

## 2016-12-11 DIAGNOSIS — Z23 Encounter for immunization: Secondary | ICD-10-CM

## 2016-12-11 DIAGNOSIS — K219 Gastro-esophageal reflux disease without esophagitis: Secondary | ICD-10-CM | POA: Diagnosis not present

## 2016-12-11 DIAGNOSIS — J4540 Moderate persistent asthma, uncomplicated: Secondary | ICD-10-CM

## 2016-12-11 MED ORDER — FLUTICASONE-UMECLIDIN-VILANT 100-62.5-25 MCG/INH IN AEPB: 1.0000 | INHALATION_SPRAY | Freq: Every day | RESPIRATORY_TRACT | 6 refills | Status: DC

## 2016-12-11 MED ORDER — FLUTICASONE-UMECLIDIN-VILANT 100-62.5-25 MCG/INH IN AEPB: 1.0000 | INHALATION_SPRAY | Freq: Every day | RESPIRATORY_TRACT | 12 refills | Status: DC

## 2016-12-11 MED ORDER — FLUTICASONE-UMECLIDIN-VILANT 100-62.5-25 MCG/INH IN AEPB: 1.0000 | INHALATION_SPRAY | Freq: Every day | RESPIRATORY_TRACT | 0 refills | Status: DC

## 2016-12-11 NOTE — Assessment & Plan Note (Signed)
She hasn't recognized recent reflux or heartburn but that remains a potential trigger for her reactive airways. Reflux precautions reinforced.

## 2016-12-11 NOTE — Patient Instructions (Addendum)
Sample and printed script to try Trelegy Ellipta inhaler  Instead of Breo.     Inhale 1 puff, then rinse mouth, once daily.         If you don't like Trelegy, or insurance won't cover it, we can go back to Breo 100.  Flu vax  Hi dose  Ok to continue using the nebulizer with ipratropium/ albuterol up to every 4 hours, IF Needed  Ok to cancel the October appointment and see [place] again in about 4 months

## 2016-12-11 NOTE — Assessment & Plan Note (Signed)
Recent nonspecific exacerbation. Plan-try changing Breo to Trelegy. Because of her labile blood sugars I would rather not go up to Breo 200 if possible. If insurance will cover Trelegy we will reconsider. Flu shot as requested today.

## 2016-12-11 NOTE — Progress Notes (Signed)
Patient ID: [name], female    DOB: [birth year], 66 y.o.   MRN: [id number]  HPI  female former smoker followed for Allergic rhinitis, Asthma, complicated by anxiety/VCD, CAD, GERD, DM, tachycardia, tremor/deep brain stimulator, HBP, now living in assisted living Allergy Profile 01/10/2013-total IgE 166 with significant elevations for most common allergens except molds She had been on allergy vaccine years ago. Office Spirometry 12/13/2014-mild obstruction, mild restriction of forced vital capacity Upper endoscopy 2016-[name]'s ring and gastric erosions ----------------------------------------------------------  08/27/16-66 year old female former smoker followed for Allergic rhinitis, Asthma, complicated by anxiety/VCD, CAD, GERD, DM, tachycardia, tremor/deep brain stimulator, HBP, now living in assisted living Hospital follow up for bronchitis patient states that she is doing a little better states that she is taking 3 breathing treatments a day after coming out of the hospital patient was only seen in the ED   ED visit 08/17/16 for complaint 3 days SOB with wheeze, chest congestion, right-sided lateral chest pain. Aspiration pneumonia 2 months prior. Leg swelling times one or 2 months. O2 sat was 96%, CXR NAD. Treated with Decadron and improved. This morning had "another attack" after breakfast. Noted wheezing and coughing and felt congested. Has been using nebulizer machine 3 times daily and use it this morning with good response. Now feels mostly back to normal. Did not recognize a choking or reflux event. Continues to sleep with head of adjustable bed elevated.  12/11/16- 66 year old female former smoker followed for Allergic rhinitis, Asthma, complicated by anxiety/VCD, CAD, GERD, DM, tachycardia, tremor/deep brain stimulator, HBP, now living in assisted living ACUTE VISIT: Pt has had several asthma attacks lately-few that have woken her from her sleep. Pt has had to use inhaler and  nebulizer more often as well.  Breo 100, neb DuoNeb,    She is able to use her nebulizer machine with ipratropium-albuterol 2 or 3 times daily as needed. Does not think she is refluxing worse than usual and attributes increased wheezing/asthma in recent weeks to seasonal change. Not much sneeze or drainage. No obvious infection. She had needed behavioral health intervention for mood disorder and historically her emotional upsets have been important triggers for complaints of asthma. Today she feels much improved. CXR 11/16/16 IMPRESSION: No active cardiopulmonary disease.  ROS-see HPI    "+" = pos Constitutional:    weight loss, night sweats, fevers, chills, fatigue, lassitude. HEENT:    headaches, difficulty swallowing, tooth/dental problems, sore throat,       sneezing, itching, ear ache, nasal congestion, post nasal drip, snoring CV:    chest pain, orthopnea, PND, swelling in lower extremities, anasarca,                                                  dizziness, palpitations Resp:   +shortness of breath with exertion or at rest.                +productive cough,   + non-productive cough, coughing up of blood.              change in color of mucus.  +wheezing.   Skin:    rash or lesions. GI:  + heartburn, indigestion, abdominal pain, nausea, vomiting, GU:  MS:   joint pain, stiffness, decreased range of motion, back pain. Neuro-    + chronic tremor Psych:  +change in mood or affect.  +  depression or anxiety.   memory loss.  OBJ- Physical Exam General- Alert, Oriented, Affect-appropriate, Distress- none acute, talkative, wheelchair Skin- rash-none, lesions- none, excoriation- none Lymphadenopathy- none Head- atraumatic            Eyes- Gross vision intact, PERRLA, conjunctivae and secretions clear            Ears- Hearing, canals-normal            Nose- Clear, no-Septal dev, mucus, polyps, erosion, perforation             Throat- Mallampati II , mucosa -tongue slightly coated ,  drainage- none, tonsils- atrophic, mild hoarseness Neck- flexible , trachea midline, no stridor , thyroid nl, carotid no bruit Chest - symmetrical excursion , unlabored           Heart/CV- RRR , no murmur , no gallop  , no rub, nl s1 s2                           - JVD- none , edema- none, stasis changes- none, varices- none           Lung- wheeze + bilateral upper zone/unlabored, cough-none , dullness-none, rub- none, rhonchi-none           Chest wall-  Abd-  Br/ Gen/ Rectal- Not done, not indicated Extrem- cyanosis- none, clubbing, none, atrophy- none, strength- nl, + rolling walker Neuro- + tremor and head bob,+ mild dysphonia

## 2016-12-11 NOTE — Telephone Encounter (Signed)
Called and spoke with pts med tech and she stated that the pt came back and said that no samples were given to her and no rx.  The medication has been sent to [facility].

## 2016-12-14 ENCOUNTER — Telehealth: Payer: Self-pay | Admitting: Internal Medicine

## 2016-12-14 DIAGNOSIS — M65332 Trigger finger, left middle finger: Secondary | ICD-10-CM | POA: Diagnosis not present

## 2016-12-14 DIAGNOSIS — M79645 Pain in left finger(s): Secondary | ICD-10-CM | POA: Diagnosis not present

## 2016-12-14 MED ORDER — FLUTICASONE-UMECLIDIN-VILANT 100-62.5-25 MCG/INH IN AEPB
1.0000 | INHALATION_SPRAY | Freq: Every day | RESPIRATORY_TRACT | 12 refills | Status: DC
Start: 2016-12-14 — End: 2016-12-17

## 2016-12-14 NOTE — Telephone Encounter (Signed)
Attempted to call patient back but patient was available. Will await her call back.

## 2016-12-14 NOTE — Telephone Encounter (Signed)
Called pt, she needed a refill to [facility]. I resent the Rx because the original prescription was printed and not sent electronically. Pt understood and nothing further is needed.

## 2016-12-17 ENCOUNTER — Other Ambulatory Visit: Payer: Self-pay | Admitting: *Deleted

## 2016-12-17 DIAGNOSIS — F4312 Post-traumatic stress disorder, chronic: Secondary | ICD-10-CM | POA: Diagnosis not present

## 2016-12-17 DIAGNOSIS — F3163 Bipolar disorder, current episode mixed, severe, without psychotic features: Secondary | ICD-10-CM | POA: Diagnosis not present

## 2016-12-17 MED ORDER — FLUTICASONE FUROATE-VILANTEROL 100-25 MCG/INH IN AEPB: 1.0000 | INHALATION_SPRAY | Freq: Every day | RESPIRATORY_TRACT | 11 refills | Status: DC

## 2016-12-21 ENCOUNTER — Telehealth: Payer: Self-pay | Admitting: Internal Medicine

## 2016-12-21 MED ORDER — IPRATROPIUM-ALBUTEROL 0.5-2.5 (3) MG/3ML IN SOLN: RESPIRATORY_TRACT | 1 refills | Status: DC

## 2016-12-21 NOTE — Telephone Encounter (Signed)
Ok. Rx signed by Dr. Annamaria Boots and faxed to [name]. Nothing further is needed.

## 2016-12-21 NOTE — Telephone Encounter (Signed)
Patient called from the [facility] asking for RMR or EG to send a note on letter head that she is not to have hot pepper or spicy seasoning in her food. Fax # [phone]

## 2016-12-21 NOTE — Telephone Encounter (Signed)
Called [name] back and pt is taking the duoneb every 6 hours but she wants the Rx written for every 4 hours. CY is this ok? If so, can we fax order to [phone].

## 2016-12-21 NOTE — Telephone Encounter (Signed)
Randall Hiss, can you do a letter for her?

## 2016-12-21 NOTE — Telephone Encounter (Signed)
Yes- ok to change to every 4 hours as needed for wheeze, asthma

## 2016-12-22 ENCOUNTER — Encounter: Payer: Self-pay | Admitting: Nurse Practitioner

## 2016-12-22 NOTE — Telephone Encounter (Signed)
Letter generated and placed on your desk for her to pickup or [place] to mail (per her preference).

## 2016-12-22 NOTE — Telephone Encounter (Signed)
Pt wanted it faxed. I have faxed it to [facility].

## 2016-12-24 ENCOUNTER — Telehealth: Payer: Self-pay | Admitting: Internal Medicine

## 2016-12-24 MED ORDER — IPRATROPIUM-ALBUTEROL 0.5-2.5 (3) MG/3ML IN SOLN: RESPIRATORY_TRACT | 1 refills | Status: DC

## 2016-12-24 NOTE — Telephone Encounter (Signed)
Called and had to leave a message for [name] at [facility] to call our office back about this pt.

## 2016-12-24 NOTE — Telephone Encounter (Signed)
Called [name] and the telephone operator stated that she was gone for the day. We can try again tomorrow but will send this message to CY.

## 2016-12-24 NOTE — Telephone Encounter (Signed)
I called and spoke to another lady there with pt and she stated the Rx was supposed to go to [facility]. I advised her to tell [name] I would send it there. Nothing further is needed.

## 2016-12-24 NOTE — Telephone Encounter (Signed)
Yes- instruction at last visit was -- If insurance won't cover Trelegy, just go back to [place] as before.   [place]

## 2016-12-25 ENCOUNTER — Telehealth: Payer: Self-pay | Admitting: Internal Medicine

## 2016-12-25 NOTE — Telephone Encounter (Signed)
Attempted to call [name] again, but the staff stated that she is not in the office today. Will need to call back tomorrow.

## 2016-12-25 NOTE — Telephone Encounter (Signed)
Spoke with [name] and I advised her that the Atrovent is written for 3 times a day before meals already. I faxed her a copy of the RX to [phone].

## 2016-12-28 ENCOUNTER — Telehealth: Payer: Self-pay | Admitting: Internal Medicine

## 2016-12-28 NOTE — Telephone Encounter (Signed)
atc pt, callback number is for [facility]- was forwarded to pt's nurse, who states she is unsure of any med changes that need to be made.  Nurse will have pt call back when she is available.

## 2016-12-28 NOTE — Telephone Encounter (Signed)
Called and spoke to [place] with [facility] and informed her of the recs per CY. Zigmund Daniel verbalized understanding and denied any further questions or concerns at this time.

## 2016-12-28 NOTE — Telephone Encounter (Signed)
Spoke with pt, requesting to have duoneb rx changed from q4h to q4h prn.  Pt states she is not wheezing or sob currently and doesn't understand why she needs to take it so frequently.   Pt also requesting for her Atrovent to be written TID before meals.  I advised pt that this is how this rx is currently written.  CY ok to change duoneb rx?  Thanks.

## 2016-12-28 NOTE — Telephone Encounter (Signed)
Ok to change the neb orders to every 4 hours as needed for wheezing, shortness of breath.

## 2016-12-28 NOTE — Telephone Encounter (Signed)
Spoke with a Marine scientist at [facility]. She is aware of the RX change. Nothing else needed at time of call.

## 2016-12-31 DIAGNOSIS — M5416 Radiculopathy, lumbar region: Secondary | ICD-10-CM | POA: Diagnosis not present

## 2016-12-31 DIAGNOSIS — M4120 Other idiopathic scoliosis, site unspecified: Secondary | ICD-10-CM | POA: Diagnosis not present

## 2016-12-31 DIAGNOSIS — Z6830 Body mass index (BMI) 30.0-30.9, adult: Secondary | ICD-10-CM | POA: Diagnosis not present

## 2016-12-31 DIAGNOSIS — M545 Low back pain: Secondary | ICD-10-CM | POA: Diagnosis not present

## 2017-01-05 DIAGNOSIS — F3163 Bipolar disorder, current episode mixed, severe, without psychotic features: Secondary | ICD-10-CM | POA: Diagnosis not present

## 2017-01-06 ENCOUNTER — Telehealth: Payer: Self-pay | Admitting: Internal Medicine

## 2017-01-06 DIAGNOSIS — F4312 Post-traumatic stress disorder, chronic: Secondary | ICD-10-CM | POA: Diagnosis not present

## 2017-01-06 DIAGNOSIS — G894 Chronic pain syndrome: Secondary | ICD-10-CM | POA: Diagnosis not present

## 2017-01-06 DIAGNOSIS — F3163 Bipolar disorder, current episode mixed, severe, without psychotic features: Secondary | ICD-10-CM | POA: Diagnosis not present

## 2017-01-06 NOTE — Telephone Encounter (Signed)
[place] and spoke with Caryl Pina, receptionist. Caryl Pina states the pt's med tech wasn't available but will give her the message and will have her call us back.    Per last OV with CY on 12/11/16:  Patient Instructions   Sample and printed script to try Trelegy Ellipta inhaler  Instead of Breo.     Inhale 1 puff, then rinse mouth, once daily.         If you don't like Trelegy, or insurance won't cover it, we can go back to Breo 100.   Flu vax  Hi dose   Ok to continue using the nebulizer with ipratropium/ albuterol up to every 4 hours, IF Needed   Ok to cancel the October appointment and see [place] again in about 4 months

## 2017-01-07 DIAGNOSIS — K267 Chronic duodenal ulcer without hemorrhage or perforation: Secondary | ICD-10-CM | POA: Diagnosis not present

## 2017-01-07 DIAGNOSIS — M6281 Muscle weakness (generalized): Secondary | ICD-10-CM | POA: Diagnosis not present

## 2017-01-07 DIAGNOSIS — E1165 Type 2 diabetes mellitus with hyperglycemia: Secondary | ICD-10-CM | POA: Diagnosis not present

## 2017-01-07 DIAGNOSIS — J42 Unspecified chronic bronchitis: Secondary | ICD-10-CM | POA: Diagnosis not present

## 2017-01-08 NOTE — Telephone Encounter (Signed)
[facility] and spoke with pt's Med Neva Seat - she did receive the message from yesterday stating that pt's Duoneb is every 4 hours IF needed as reported by CY.  Alwyn Ren voiced her understanding.  Called spoke with patient to inform her of the above.  Informed patient that she will need to ASK for the duoneb if she has any wheezing/shortness of breath.  Pt voiced her understanding and appreciation.  Nothing further needed; will sign off

## 2017-01-11 DIAGNOSIS — M1811 Unilateral primary osteoarthritis of first carpometacarpal joint, right hand: Secondary | ICD-10-CM | POA: Diagnosis not present

## 2017-01-11 DIAGNOSIS — M65332 Trigger finger, left middle finger: Secondary | ICD-10-CM | POA: Diagnosis not present

## 2017-01-12 ENCOUNTER — Telehealth: Payer: Self-pay | Admitting: Internal Medicine

## 2017-01-12 MED ORDER — IPRATROPIUM BROMIDE 0.03 % NA SOLN: NASAL | 11 refills | Status: DC

## 2017-01-12 NOTE — Telephone Encounter (Signed)
Signed rx regutned to triage by CY. Faxed to below fax #.  Nothing further needed.

## 2017-01-12 NOTE — Telephone Encounter (Signed)
Ok to change order to three times daily before meals

## 2017-01-12 NOTE — Telephone Encounter (Signed)
rx printed and placed on CY's cart for signature.   

## 2017-01-12 NOTE — Telephone Encounter (Signed)
Spoke with [name] with [facility]. [name] request Rx for Atrovent nasal spray tid before meals be faxed to [phone]. Current Rx states 1-2 spray up to three times daily before meals. [name] request Atrovent be given tid scheduled.   CY please advise. Thanks.

## 2017-01-13 ENCOUNTER — Ambulatory Visit: Payer: Self-pay | Admitting: Internal Medicine

## 2017-01-15 DIAGNOSIS — J42 Unspecified chronic bronchitis: Secondary | ICD-10-CM | POA: Diagnosis not present

## 2017-01-15 DIAGNOSIS — F319 Bipolar disorder, unspecified: Secondary | ICD-10-CM | POA: Diagnosis not present

## 2017-01-15 DIAGNOSIS — M6281 Muscle weakness (generalized): Secondary | ICD-10-CM | POA: Diagnosis not present

## 2017-01-15 DIAGNOSIS — I10 Essential (primary) hypertension: Secondary | ICD-10-CM | POA: Diagnosis not present

## 2017-01-15 DIAGNOSIS — M5416 Radiculopathy, lumbar region: Secondary | ICD-10-CM | POA: Diagnosis not present

## 2017-01-15 DIAGNOSIS — E119 Type 2 diabetes mellitus without complications: Secondary | ICD-10-CM | POA: Diagnosis not present

## 2017-01-20 DIAGNOSIS — M6281 Muscle weakness (generalized): Secondary | ICD-10-CM | POA: Diagnosis not present

## 2017-01-20 DIAGNOSIS — M5416 Radiculopathy, lumbar region: Secondary | ICD-10-CM | POA: Diagnosis not present

## 2017-01-20 DIAGNOSIS — E119 Type 2 diabetes mellitus without complications: Secondary | ICD-10-CM | POA: Diagnosis not present

## 2017-01-20 DIAGNOSIS — F319 Bipolar disorder, unspecified: Secondary | ICD-10-CM | POA: Diagnosis not present

## 2017-01-20 DIAGNOSIS — I10 Essential (primary) hypertension: Secondary | ICD-10-CM | POA: Diagnosis not present

## 2017-01-20 DIAGNOSIS — J42 Unspecified chronic bronchitis: Secondary | ICD-10-CM | POA: Diagnosis not present

## 2017-01-21 DIAGNOSIS — I10 Essential (primary) hypertension: Secondary | ICD-10-CM | POA: Diagnosis not present

## 2017-01-21 DIAGNOSIS — E119 Type 2 diabetes mellitus without complications: Secondary | ICD-10-CM | POA: Diagnosis not present

## 2017-01-21 DIAGNOSIS — M5416 Radiculopathy, lumbar region: Secondary | ICD-10-CM | POA: Diagnosis not present

## 2017-01-21 DIAGNOSIS — F319 Bipolar disorder, unspecified: Secondary | ICD-10-CM | POA: Diagnosis not present

## 2017-01-21 DIAGNOSIS — J42 Unspecified chronic bronchitis: Secondary | ICD-10-CM | POA: Diagnosis not present

## 2017-01-21 DIAGNOSIS — M6281 Muscle weakness (generalized): Secondary | ICD-10-CM | POA: Diagnosis not present

## 2017-01-22 DIAGNOSIS — F3163 Bipolar disorder, current episode mixed, severe, without psychotic features: Secondary | ICD-10-CM | POA: Diagnosis not present

## 2017-01-22 DIAGNOSIS — F4312 Post-traumatic stress disorder, chronic: Secondary | ICD-10-CM | POA: Diagnosis not present

## 2017-01-22 DIAGNOSIS — F319 Bipolar disorder, unspecified: Secondary | ICD-10-CM | POA: Diagnosis not present

## 2017-01-22 DIAGNOSIS — M5416 Radiculopathy, lumbar region: Secondary | ICD-10-CM | POA: Diagnosis not present

## 2017-01-22 DIAGNOSIS — I10 Essential (primary) hypertension: Secondary | ICD-10-CM | POA: Diagnosis not present

## 2017-01-22 DIAGNOSIS — M6281 Muscle weakness (generalized): Secondary | ICD-10-CM | POA: Diagnosis not present

## 2017-01-22 DIAGNOSIS — E119 Type 2 diabetes mellitus without complications: Secondary | ICD-10-CM | POA: Diagnosis not present

## 2017-01-22 DIAGNOSIS — J42 Unspecified chronic bronchitis: Secondary | ICD-10-CM | POA: Diagnosis not present

## 2017-01-26 ENCOUNTER — Telehealth: Payer: Self-pay | Admitting: Internal Medicine

## 2017-01-26 DIAGNOSIS — F319 Bipolar disorder, unspecified: Secondary | ICD-10-CM | POA: Diagnosis not present

## 2017-01-26 DIAGNOSIS — J42 Unspecified chronic bronchitis: Secondary | ICD-10-CM | POA: Diagnosis not present

## 2017-01-26 DIAGNOSIS — I10 Essential (primary) hypertension: Secondary | ICD-10-CM | POA: Diagnosis not present

## 2017-01-26 DIAGNOSIS — E119 Type 2 diabetes mellitus without complications: Secondary | ICD-10-CM | POA: Diagnosis not present

## 2017-01-26 DIAGNOSIS — M5416 Radiculopathy, lumbar region: Secondary | ICD-10-CM | POA: Diagnosis not present

## 2017-01-26 DIAGNOSIS — M6281 Muscle weakness (generalized): Secondary | ICD-10-CM | POA: Diagnosis not present

## 2017-01-26 NOTE — Telephone Encounter (Signed)
Spoke with [name] with [facility], who states they do not have updated Rx for Atrovent on file for pt.  [name] is aware that per our records Rx for Atrovent was faxed to provided number on 01/12/17. [name] states she will check with pharmacist to see if Rx was received and will call us back with an update.

## 2017-01-27 DIAGNOSIS — J42 Unspecified chronic bronchitis: Secondary | ICD-10-CM | POA: Diagnosis not present

## 2017-01-27 DIAGNOSIS — I10 Essential (primary) hypertension: Secondary | ICD-10-CM | POA: Diagnosis not present

## 2017-01-27 DIAGNOSIS — E119 Type 2 diabetes mellitus without complications: Secondary | ICD-10-CM | POA: Diagnosis not present

## 2017-01-27 DIAGNOSIS — M5416 Radiculopathy, lumbar region: Secondary | ICD-10-CM | POA: Diagnosis not present

## 2017-01-27 DIAGNOSIS — M6281 Muscle weakness (generalized): Secondary | ICD-10-CM | POA: Diagnosis not present

## 2017-01-27 DIAGNOSIS — F319 Bipolar disorder, unspecified: Secondary | ICD-10-CM | POA: Diagnosis not present

## 2017-01-28 DIAGNOSIS — M5416 Radiculopathy, lumbar region: Secondary | ICD-10-CM | POA: Diagnosis not present

## 2017-01-28 DIAGNOSIS — J42 Unspecified chronic bronchitis: Secondary | ICD-10-CM | POA: Diagnosis not present

## 2017-01-28 DIAGNOSIS — M6281 Muscle weakness (generalized): Secondary | ICD-10-CM | POA: Diagnosis not present

## 2017-01-28 DIAGNOSIS — E119 Type 2 diabetes mellitus without complications: Secondary | ICD-10-CM | POA: Diagnosis not present

## 2017-01-28 DIAGNOSIS — I10 Essential (primary) hypertension: Secondary | ICD-10-CM | POA: Diagnosis not present

## 2017-01-28 DIAGNOSIS — F319 Bipolar disorder, unspecified: Secondary | ICD-10-CM | POA: Diagnosis not present

## 2017-01-29 DIAGNOSIS — I10 Essential (primary) hypertension: Secondary | ICD-10-CM | POA: Diagnosis not present

## 2017-01-29 DIAGNOSIS — F319 Bipolar disorder, unspecified: Secondary | ICD-10-CM | POA: Diagnosis not present

## 2017-01-29 DIAGNOSIS — M5416 Radiculopathy, lumbar region: Secondary | ICD-10-CM | POA: Diagnosis not present

## 2017-01-29 DIAGNOSIS — M6281 Muscle weakness (generalized): Secondary | ICD-10-CM | POA: Diagnosis not present

## 2017-01-29 DIAGNOSIS — E119 Type 2 diabetes mellitus without complications: Secondary | ICD-10-CM | POA: Diagnosis not present

## 2017-01-29 DIAGNOSIS — J42 Unspecified chronic bronchitis: Secondary | ICD-10-CM | POA: Diagnosis not present

## 2017-01-29 NOTE — Telephone Encounter (Signed)
ATC [facility] and speak with [name]- was transferred x 4 and placed on 5 min hold  WCB later

## 2017-02-01 DIAGNOSIS — M5416 Radiculopathy, lumbar region: Secondary | ICD-10-CM | POA: Diagnosis not present

## 2017-02-01 DIAGNOSIS — E119 Type 2 diabetes mellitus without complications: Secondary | ICD-10-CM | POA: Diagnosis not present

## 2017-02-01 DIAGNOSIS — M6281 Muscle weakness (generalized): Secondary | ICD-10-CM | POA: Diagnosis not present

## 2017-02-01 DIAGNOSIS — F3163 Bipolar disorder, current episode mixed, severe, without psychotic features: Secondary | ICD-10-CM | POA: Diagnosis not present

## 2017-02-01 DIAGNOSIS — F319 Bipolar disorder, unspecified: Secondary | ICD-10-CM | POA: Diagnosis not present

## 2017-02-01 DIAGNOSIS — J42 Unspecified chronic bronchitis: Secondary | ICD-10-CM | POA: Diagnosis not present

## 2017-02-01 DIAGNOSIS — I10 Essential (primary) hypertension: Secondary | ICD-10-CM | POA: Diagnosis not present

## 2017-02-01 NOTE — Telephone Encounter (Signed)
Spoke with [name] at [facility], states that nothing further is needed regarding pt's Atrovent rx.  Will close encounter.

## 2017-02-02 DIAGNOSIS — I10 Essential (primary) hypertension: Secondary | ICD-10-CM | POA: Diagnosis not present

## 2017-02-02 DIAGNOSIS — F319 Bipolar disorder, unspecified: Secondary | ICD-10-CM | POA: Diagnosis not present

## 2017-02-02 DIAGNOSIS — E119 Type 2 diabetes mellitus without complications: Secondary | ICD-10-CM | POA: Diagnosis not present

## 2017-02-02 DIAGNOSIS — M5416 Radiculopathy, lumbar region: Secondary | ICD-10-CM | POA: Diagnosis not present

## 2017-02-02 DIAGNOSIS — M6281 Muscle weakness (generalized): Secondary | ICD-10-CM | POA: Diagnosis not present

## 2017-02-02 DIAGNOSIS — J42 Unspecified chronic bronchitis: Secondary | ICD-10-CM | POA: Diagnosis not present

## 2017-02-03 DIAGNOSIS — M6281 Muscle weakness (generalized): Secondary | ICD-10-CM | POA: Diagnosis not present

## 2017-02-03 DIAGNOSIS — Z79899 Other long term (current) drug therapy: Secondary | ICD-10-CM | POA: Diagnosis not present

## 2017-02-03 DIAGNOSIS — R569 Unspecified convulsions: Secondary | ICD-10-CM | POA: Diagnosis not present

## 2017-02-03 DIAGNOSIS — F319 Bipolar disorder, unspecified: Secondary | ICD-10-CM | POA: Diagnosis not present

## 2017-02-03 DIAGNOSIS — D649 Anemia, unspecified: Secondary | ICD-10-CM | POA: Diagnosis not present

## 2017-02-03 DIAGNOSIS — J42 Unspecified chronic bronchitis: Secondary | ICD-10-CM | POA: Diagnosis not present

## 2017-02-03 DIAGNOSIS — E119 Type 2 diabetes mellitus without complications: Secondary | ICD-10-CM | POA: Diagnosis not present

## 2017-02-03 DIAGNOSIS — M5416 Radiculopathy, lumbar region: Secondary | ICD-10-CM | POA: Diagnosis not present

## 2017-02-03 DIAGNOSIS — I10 Essential (primary) hypertension: Secondary | ICD-10-CM | POA: Diagnosis not present

## 2017-02-04 DIAGNOSIS — M6281 Muscle weakness (generalized): Secondary | ICD-10-CM | POA: Diagnosis not present

## 2017-02-04 DIAGNOSIS — J42 Unspecified chronic bronchitis: Secondary | ICD-10-CM | POA: Diagnosis not present

## 2017-02-04 DIAGNOSIS — E119 Type 2 diabetes mellitus without complications: Secondary | ICD-10-CM | POA: Diagnosis not present

## 2017-02-04 DIAGNOSIS — I10 Essential (primary) hypertension: Secondary | ICD-10-CM | POA: Diagnosis not present

## 2017-02-04 DIAGNOSIS — M5416 Radiculopathy, lumbar region: Secondary | ICD-10-CM | POA: Diagnosis not present

## 2017-02-04 DIAGNOSIS — F319 Bipolar disorder, unspecified: Secondary | ICD-10-CM | POA: Diagnosis not present

## 2017-02-05 DIAGNOSIS — F3163 Bipolar disorder, current episode mixed, severe, without psychotic features: Secondary | ICD-10-CM | POA: Diagnosis not present

## 2017-02-05 DIAGNOSIS — F4312 Post-traumatic stress disorder, chronic: Secondary | ICD-10-CM | POA: Diagnosis not present

## 2017-02-08 DIAGNOSIS — M5416 Radiculopathy, lumbar region: Secondary | ICD-10-CM | POA: Diagnosis not present

## 2017-02-08 DIAGNOSIS — I10 Essential (primary) hypertension: Secondary | ICD-10-CM | POA: Diagnosis not present

## 2017-02-08 DIAGNOSIS — E119 Type 2 diabetes mellitus without complications: Secondary | ICD-10-CM | POA: Diagnosis not present

## 2017-02-08 DIAGNOSIS — F319 Bipolar disorder, unspecified: Secondary | ICD-10-CM | POA: Diagnosis not present

## 2017-02-08 DIAGNOSIS — J42 Unspecified chronic bronchitis: Secondary | ICD-10-CM | POA: Diagnosis not present

## 2017-02-08 DIAGNOSIS — M6281 Muscle weakness (generalized): Secondary | ICD-10-CM | POA: Diagnosis not present

## 2017-02-10 DIAGNOSIS — J42 Unspecified chronic bronchitis: Secondary | ICD-10-CM | POA: Diagnosis not present

## 2017-02-10 DIAGNOSIS — E119 Type 2 diabetes mellitus without complications: Secondary | ICD-10-CM | POA: Diagnosis not present

## 2017-02-10 DIAGNOSIS — F319 Bipolar disorder, unspecified: Secondary | ICD-10-CM | POA: Diagnosis not present

## 2017-02-10 DIAGNOSIS — I10 Essential (primary) hypertension: Secondary | ICD-10-CM | POA: Diagnosis not present

## 2017-02-10 DIAGNOSIS — M5416 Radiculopathy, lumbar region: Secondary | ICD-10-CM | POA: Diagnosis not present

## 2017-02-10 DIAGNOSIS — M6281 Muscle weakness (generalized): Secondary | ICD-10-CM | POA: Diagnosis not present

## 2017-02-12 DIAGNOSIS — J42 Unspecified chronic bronchitis: Secondary | ICD-10-CM | POA: Diagnosis not present

## 2017-02-12 DIAGNOSIS — M5416 Radiculopathy, lumbar region: Secondary | ICD-10-CM | POA: Diagnosis not present

## 2017-02-12 DIAGNOSIS — E119 Type 2 diabetes mellitus without complications: Secondary | ICD-10-CM | POA: Diagnosis not present

## 2017-02-12 DIAGNOSIS — M6281 Muscle weakness (generalized): Secondary | ICD-10-CM | POA: Diagnosis not present

## 2017-02-12 DIAGNOSIS — F319 Bipolar disorder, unspecified: Secondary | ICD-10-CM | POA: Diagnosis not present

## 2017-02-12 DIAGNOSIS — I10 Essential (primary) hypertension: Secondary | ICD-10-CM | POA: Diagnosis not present

## 2017-02-15 DIAGNOSIS — I10 Essential (primary) hypertension: Secondary | ICD-10-CM | POA: Diagnosis not present

## 2017-02-15 DIAGNOSIS — F319 Bipolar disorder, unspecified: Secondary | ICD-10-CM | POA: Diagnosis not present

## 2017-02-15 DIAGNOSIS — E119 Type 2 diabetes mellitus without complications: Secondary | ICD-10-CM | POA: Diagnosis not present

## 2017-02-15 DIAGNOSIS — M5416 Radiculopathy, lumbar region: Secondary | ICD-10-CM | POA: Diagnosis not present

## 2017-02-15 DIAGNOSIS — M6281 Muscle weakness (generalized): Secondary | ICD-10-CM | POA: Diagnosis not present

## 2017-02-15 DIAGNOSIS — J42 Unspecified chronic bronchitis: Secondary | ICD-10-CM | POA: Diagnosis not present

## 2017-02-17 DIAGNOSIS — F319 Bipolar disorder, unspecified: Secondary | ICD-10-CM | POA: Diagnosis not present

## 2017-02-17 DIAGNOSIS — M5416 Radiculopathy, lumbar region: Secondary | ICD-10-CM | POA: Diagnosis not present

## 2017-02-17 DIAGNOSIS — I10 Essential (primary) hypertension: Secondary | ICD-10-CM | POA: Diagnosis not present

## 2017-02-17 DIAGNOSIS — E119 Type 2 diabetes mellitus without complications: Secondary | ICD-10-CM | POA: Diagnosis not present

## 2017-02-17 DIAGNOSIS — M6281 Muscle weakness (generalized): Secondary | ICD-10-CM | POA: Diagnosis not present

## 2017-02-17 DIAGNOSIS — J42 Unspecified chronic bronchitis: Secondary | ICD-10-CM | POA: Diagnosis not present

## 2017-02-23 DIAGNOSIS — I10 Essential (primary) hypertension: Secondary | ICD-10-CM | POA: Diagnosis not present

## 2017-02-23 DIAGNOSIS — M6281 Muscle weakness (generalized): Secondary | ICD-10-CM | POA: Diagnosis not present

## 2017-02-23 DIAGNOSIS — F319 Bipolar disorder, unspecified: Secondary | ICD-10-CM | POA: Diagnosis not present

## 2017-02-23 DIAGNOSIS — J42 Unspecified chronic bronchitis: Secondary | ICD-10-CM | POA: Diagnosis not present

## 2017-02-23 DIAGNOSIS — M5416 Radiculopathy, lumbar region: Secondary | ICD-10-CM | POA: Diagnosis not present

## 2017-02-23 DIAGNOSIS — E119 Type 2 diabetes mellitus without complications: Secondary | ICD-10-CM | POA: Diagnosis not present

## 2017-02-26 DIAGNOSIS — I10 Essential (primary) hypertension: Secondary | ICD-10-CM | POA: Diagnosis not present

## 2017-02-26 DIAGNOSIS — M5416 Radiculopathy, lumbar region: Secondary | ICD-10-CM | POA: Diagnosis not present

## 2017-02-26 DIAGNOSIS — E119 Type 2 diabetes mellitus without complications: Secondary | ICD-10-CM | POA: Diagnosis not present

## 2017-02-26 DIAGNOSIS — M6281 Muscle weakness (generalized): Secondary | ICD-10-CM | POA: Diagnosis not present

## 2017-02-26 DIAGNOSIS — F319 Bipolar disorder, unspecified: Secondary | ICD-10-CM | POA: Diagnosis not present

## 2017-02-26 DIAGNOSIS — J42 Unspecified chronic bronchitis: Secondary | ICD-10-CM | POA: Diagnosis not present

## 2017-03-01 ENCOUNTER — Telehealth: Payer: Self-pay | Admitting: Internal Medicine

## 2017-03-01 DIAGNOSIS — M4120 Other idiopathic scoliosis, site unspecified: Secondary | ICD-10-CM | POA: Diagnosis not present

## 2017-03-01 DIAGNOSIS — F3163 Bipolar disorder, current episode mixed, severe, without psychotic features: Secondary | ICD-10-CM | POA: Diagnosis not present

## 2017-03-01 DIAGNOSIS — G894 Chronic pain syndrome: Secondary | ICD-10-CM | POA: Diagnosis not present

## 2017-03-01 DIAGNOSIS — M542 Cervicalgia: Secondary | ICD-10-CM | POA: Diagnosis not present

## 2017-03-01 NOTE — Telephone Encounter (Signed)
Ok to Rx benadryl 25 mg, # 60, 1 twice daily as needed, refill x 12

## 2017-03-01 NOTE — Telephone Encounter (Signed)
Called and spoke to [place] at [facility]. Zigmund Daniel states pt is experiencing increased nasal congestion. Zigmund Daniel does not feel that Atrovent is effective for pt, and wanted to know if pt can be given Benadryl BID.  Rx can be faxed to [phone]  [facility] please advise. Thanks.   Current Outpatient Medications on File Prior to Visit  Medication Sig Dispense Refill  . aspirin EC 81 MG tablet Take 81 mg by mouth daily.    . divalproex (DEPAKOTE) 500 MG DR tablet Take 1 tablet by mouth 2 (two) times daily.    . fesoterodine (TOVIAZ) 8 MG TB24 tablet Take 8 mg by mouth daily.    . fexofenadine (ALLEGRA) 60 MG tablet Take 60 mg by mouth 2 (two) times daily.    . fluticasone furoate-vilanterol (BREO ELLIPTA) 100-25 MCG/INH AEPB Inhale 1 puff into the lungs daily. 1 each 11  . gabapentin (NEURONTIN) 600 MG tablet Take 600 mg by mouth 3 (three) times daily.    Marland Kitchen HYDROcodone-acetaminophen (NORCO/VICODIN) 5-325 MG tablet Take 1 tablet by mouth daily as needed.     . Insulin Glargine (BASAGLAR KWIKPEN) 100 UNIT/ML SOPN Inject 36 Units into the skin daily at 10 pm.     . ipratropium (ATROVENT) 0.03 % nasal spray 1-2 puffs each nostril 3 times daily, before meals 30 mL 11  . ipratropium-albuterol (DUONEB) 0.5-2.5 (3) MG/3ML SOLN 1 neb every 4 hours for wheeze/ cough. DX: J45.40 360 mL 1  . JANUVIA 100 MG tablet Take 1 tablet by mouth daily.    Marland Kitchen levothyroxine (SYNTHROID, LEVOTHROID) 75 MCG tablet TAKE (1) TABLET BY MOUTH ONCE DAILY BEFORE BREAKFAST. 30 tablet 5  . loperamide (IMODIUM A-D) 2 MG tablet Take by mouth.    . metoprolol (LOPRESSOR) 50 MG tablet Take 1 tablet by mouth daily.    . Multiple Vitamins-Minerals (MULTIVITAMINS THER. W/MINERALS) TABS tablet TAKE ONE TABLET BY MOUTH ONCE DAILY. 30 each 11  . NOVOLOG FLEXPEN 100 UNIT/ML FlexPen Inject 12 Units into the skin 3 (three) times daily with meals. Sliding scale    . Omega 3 1000 MG CAPS TAKE (1) CAPSULE BY MOUTH ONCE DAILY. 30 capsule 11  . omeprazole  (PRILOSEC) 20 MG capsule Take 1 capsule (20 mg total) by mouth 2 (two) times daily. 60 capsule 3  . OXYCONTIN 10 MG 12 hr tablet Take 10 mg by mouth every 12 (twelve) hours.     . polyethylene glycol (MIRALAX / GLYCOLAX) packet     . potassium chloride SA (K-DUR,KLOR-CON) 20 MEQ tablet Take 1 tablet by mouth daily.    . primidone (MYSOLINE) 50 MG tablet Take 2 tablets (100 mg total) by mouth 2 (two) times daily. (Patient taking differently: Take by mouth. 100 am, 50 hs) 360 tablet 1  . QUEtiapine (SEROQUEL) 100 MG tablet Take 1 tablet by mouth at bedtime.    . sodium chloride 1 g tablet Take 1 g by mouth 2 (two) times daily.    . sucralfate (CARAFATE) 1 g tablet     . tiZANidine (ZANAFLEX) 4 MG tablet TAKE 1 TABLET BY MOUTH AT BEDTIME AS NEEDED FOR MUSCLE SPASMS. 30 tablet 11  . triamterene-hydrochlorothiazide (MAXZIDE-25) 37.5-25 MG tablet Take 1 tablet by mouth daily.     No current facility-administered medications on file prior to visit.     Allergies  Allergen Reactions  . Invokana [Canagliflozin] Other (See Comments)    Yeast infectios  . Losartan     Angioedema   . Metformin And Related  Diarrhea  . Latex Other (See Comments)    Other Reaction: redness, blisters  . Oxycodone-Acetaminophen Itching    TYLOX. Caused internal itching per patient

## 2017-03-01 NOTE — Telephone Encounter (Signed)
ATC x2 but received a busy tone. Will attempt to call back later.

## 2017-03-03 DIAGNOSIS — M5416 Radiculopathy, lumbar region: Secondary | ICD-10-CM | POA: Diagnosis not present

## 2017-03-03 DIAGNOSIS — F319 Bipolar disorder, unspecified: Secondary | ICD-10-CM | POA: Diagnosis not present

## 2017-03-03 DIAGNOSIS — M810 Age-related osteoporosis without current pathological fracture: Secondary | ICD-10-CM | POA: Diagnosis not present

## 2017-03-03 DIAGNOSIS — E119 Type 2 diabetes mellitus without complications: Secondary | ICD-10-CM | POA: Diagnosis not present

## 2017-03-03 DIAGNOSIS — M6281 Muscle weakness (generalized): Secondary | ICD-10-CM | POA: Diagnosis not present

## 2017-03-03 DIAGNOSIS — D649 Anemia, unspecified: Secondary | ICD-10-CM | POA: Diagnosis not present

## 2017-03-03 DIAGNOSIS — F310 Bipolar disorder, current episode hypomanic: Secondary | ICD-10-CM | POA: Diagnosis not present

## 2017-03-03 DIAGNOSIS — I10 Essential (primary) hypertension: Secondary | ICD-10-CM | POA: Diagnosis not present

## 2017-03-03 DIAGNOSIS — J42 Unspecified chronic bronchitis: Secondary | ICD-10-CM | POA: Diagnosis not present

## 2017-03-03 NOTE — Telephone Encounter (Signed)
lmtcb x1 for [employer] with [facility].

## 2017-03-04 DIAGNOSIS — F319 Bipolar disorder, unspecified: Secondary | ICD-10-CM | POA: Diagnosis not present

## 2017-03-04 DIAGNOSIS — M5416 Radiculopathy, lumbar region: Secondary | ICD-10-CM | POA: Diagnosis not present

## 2017-03-04 DIAGNOSIS — G894 Chronic pain syndrome: Secondary | ICD-10-CM | POA: Diagnosis not present

## 2017-03-04 DIAGNOSIS — J42 Unspecified chronic bronchitis: Secondary | ICD-10-CM | POA: Diagnosis not present

## 2017-03-04 DIAGNOSIS — S330XXS Traumatic rupture of lumbar intervertebral disc, sequela: Secondary | ICD-10-CM | POA: Diagnosis not present

## 2017-03-04 DIAGNOSIS — W050XXS Fall from non-moving wheelchair, sequela: Secondary | ICD-10-CM | POA: Diagnosis not present

## 2017-03-04 DIAGNOSIS — E119 Type 2 diabetes mellitus without complications: Secondary | ICD-10-CM | POA: Diagnosis not present

## 2017-03-04 DIAGNOSIS — E1165 Type 2 diabetes mellitus with hyperglycemia: Secondary | ICD-10-CM | POA: Diagnosis not present

## 2017-03-04 DIAGNOSIS — F3163 Bipolar disorder, current episode mixed, severe, without psychotic features: Secondary | ICD-10-CM | POA: Diagnosis not present

## 2017-03-04 DIAGNOSIS — M6281 Muscle weakness (generalized): Secondary | ICD-10-CM | POA: Diagnosis not present

## 2017-03-04 DIAGNOSIS — I10 Essential (primary) hypertension: Secondary | ICD-10-CM | POA: Diagnosis not present

## 2017-03-04 MED ORDER — DIPHENHYDRAMINE HCL 25 MG PO TABS: 25.0000 mg | ORAL_TABLET | Freq: Two times a day (BID) | ORAL | 12 refills | Status: DC | PRN

## 2017-03-04 NOTE — Telephone Encounter (Signed)
LMTCB x2 for [employer]. Since CY has approved the medication, I will print the RX once I return to [facility] and fax it to the number given above.

## 2017-03-04 NOTE — Telephone Encounter (Signed)
Pt calling about the Benadryl 3xs a day script that was to be faxed.  Fax# [phone]

## 2017-03-04 NOTE — Telephone Encounter (Signed)
Rx has been faxed to provided number. Nothing further is needed.

## 2017-03-04 NOTE — Telephone Encounter (Signed)
Pam with [facility] is aware of below message and voiced understanding. Rx has been printed and placed on CY's chart for signature.   CY please return Rx to triage once signed.

## 2017-03-08 ENCOUNTER — Emergency Department (HOSPITAL_COMMUNITY): Payer: Medicare Other

## 2017-03-08 ENCOUNTER — Encounter (HOSPITAL_COMMUNITY): Payer: Self-pay | Admitting: Cardiology

## 2017-03-08 ENCOUNTER — Other Ambulatory Visit: Payer: Self-pay

## 2017-03-08 ENCOUNTER — Emergency Department (HOSPITAL_COMMUNITY)
Admission: EM | Admit: 2017-03-08 | Discharge: 2017-03-08 | Disposition: A | Discharge status: Nursing Home (Includes ICF) | Payer: Medicare Other | Attending: Emergency Medicine | Admitting: Emergency Medicine

## 2017-03-08 DIAGNOSIS — Z794 Long term (current) use of insulin: Secondary | ICD-10-CM | POA: Insufficient documentation

## 2017-03-08 DIAGNOSIS — R0789 Other chest pain: Secondary | ICD-10-CM | POA: Diagnosis not present

## 2017-03-08 DIAGNOSIS — I10 Essential (primary) hypertension: Secondary | ICD-10-CM | POA: Insufficient documentation

## 2017-03-08 DIAGNOSIS — Y999 Unspecified external cause status: Secondary | ICD-10-CM | POA: Diagnosis not present

## 2017-03-08 DIAGNOSIS — R269 Unspecified abnormalities of gait and mobility: Secondary | ICD-10-CM | POA: Diagnosis not present

## 2017-03-08 DIAGNOSIS — R079 Chest pain, unspecified: Secondary | ICD-10-CM | POA: Diagnosis not present

## 2017-03-08 DIAGNOSIS — Z79899 Other long term (current) drug therapy: Secondary | ICD-10-CM | POA: Insufficient documentation

## 2017-03-08 DIAGNOSIS — S3993XA Unspecified injury of pelvis, initial encounter: Secondary | ICD-10-CM | POA: Diagnosis not present

## 2017-03-08 DIAGNOSIS — E119 Type 2 diabetes mellitus without complications: Secondary | ICD-10-CM | POA: Diagnosis not present

## 2017-03-08 DIAGNOSIS — S300XXA Contusion of lower back and pelvis, initial encounter: Secondary | ICD-10-CM

## 2017-03-08 DIAGNOSIS — M545 Low back pain: Secondary | ICD-10-CM | POA: Diagnosis not present

## 2017-03-08 DIAGNOSIS — Z87891 Personal history of nicotine dependence: Secondary | ICD-10-CM | POA: Diagnosis not present

## 2017-03-08 DIAGNOSIS — S299XXA Unspecified injury of thorax, initial encounter: Secondary | ICD-10-CM | POA: Diagnosis not present

## 2017-03-08 DIAGNOSIS — E039 Hypothyroidism, unspecified: Secondary | ICD-10-CM | POA: Diagnosis not present

## 2017-03-08 DIAGNOSIS — R102 Pelvic and perineal pain: Secondary | ICD-10-CM | POA: Diagnosis not present

## 2017-03-08 DIAGNOSIS — S3982XA Other specified injuries of lower back, initial encounter: Secondary | ICD-10-CM | POA: Diagnosis not present

## 2017-03-08 DIAGNOSIS — S343XXA Injury of cauda equina, initial encounter: Secondary | ICD-10-CM | POA: Insufficient documentation

## 2017-03-08 DIAGNOSIS — W050XXA Fall from non-moving wheelchair, initial encounter: Secondary | ICD-10-CM | POA: Diagnosis not present

## 2017-03-08 DIAGNOSIS — Y9389 Activity, other specified: Secondary | ICD-10-CM | POA: Diagnosis not present

## 2017-03-08 DIAGNOSIS — R262 Difficulty in walking, not elsewhere classified: Secondary | ICD-10-CM

## 2017-03-08 DIAGNOSIS — Z7982 Long term (current) use of aspirin: Secondary | ICD-10-CM | POA: Diagnosis not present

## 2017-03-08 DIAGNOSIS — Y92129 Unspecified place in nursing home as the place of occurrence of the external cause: Secondary | ICD-10-CM | POA: Diagnosis not present

## 2017-03-08 DIAGNOSIS — R0781 Pleurodynia: Secondary | ICD-10-CM | POA: Diagnosis not present

## 2017-03-08 LAB — CBC
HCT: 37.7 % (ref 36.0–46.0)
Hemoglobin: 12.9 g/dL (ref 12.0–15.0)
MCH: 31.6 pg (ref 26.0–34.0)
MCHC: 34.2 g/dL (ref 30.0–36.0)
MCV: 92.4 fL (ref 78.0–100.0)
Platelets: 180 10*3/uL (ref 150–400)
RBC: 4.08 MIL/uL (ref 3.87–5.11)
RDW: 13.1 % (ref 11.5–15.5)
WBC: 11.7 10*3/uL — ABNORMAL HIGH (ref 4.0–10.5)

## 2017-03-08 LAB — COMPREHENSIVE METABOLIC PANEL
ALT: 36 U/L (ref 14–54)
AST: 27 U/L (ref 15–41)
Albumin: 3.9 g/dL (ref 3.5–5.0)
Alkaline Phosphatase: 78 U/L (ref 38–126)
Anion gap: 8 (ref 5–15)
BUN: 11 mg/dL (ref 6–20)
CO2: 26 mmol/L (ref 22–32)
Calcium: 8.9 mg/dL (ref 8.9–10.3)
Chloride: 93 mmol/L — ABNORMAL LOW (ref 101–111)
Creatinine, Ser: 0.55 mg/dL (ref 0.44–1.00)
GFR calc Af Amer: >60 mL/min (ref >60)
GFR calc non Af Amer: >60 mL/min (ref >60)
Glucose, Bld: 174 mg/dL — ABNORMAL HIGH (ref 65–99)
Potassium: 3.7 mmol/L (ref 3.5–5.1)
Sodium: 127 mmol/L — ABNORMAL LOW (ref 135–145)
Total Bilirubin: 0.8 mg/dL (ref 0.3–1.2)
Total Protein: 6.8 g/dL (ref 6.5–8.1)

## 2017-03-08 LAB — URINALYSIS, ROUTINE W REFLEX MICROSCOPIC
Bilirubin Urine: NEGATIVE
Glucose, UA: NEGATIVE mg/dL
Hgb urine dipstick: NEGATIVE
Ketones, ur: NEGATIVE mg/dL
Leukocytes, UA: NEGATIVE
Nitrite: NEGATIVE
Protein, ur: NEGATIVE mg/dL
Specific Gravity, Urine: 1.006 (ref 1.005–1.030)
pH: 7 (ref 5.0–8.0)

## 2017-03-08 LAB — TROPONIN I: Troponin I: <0.03 ng/mL (ref <0.03)

## 2017-03-08 MED ORDER — GI COCKTAIL ~~LOC~~
30.0000 mL | Freq: Once | ORAL | Status: AC
  Administered 2017-03-08: 30 mL via ORAL
  Filled 2017-03-08: qty 30

## 2017-03-08 NOTE — ED Notes (Signed)
[facility] to transport pt

## 2017-03-08 NOTE — ED Triage Notes (Signed)
EMS called out for chest pain.  EKG normal. Pt states her chest pain started at 430 am.  Also states she fell out of her chair last Tuesday and has c/o pelvic/ sacral pain since.fall.

## 2017-03-08 NOTE — ED Notes (Signed)
Patient from [facility] per [place].

## 2017-03-08 NOTE — ED Notes (Signed)
[facility] called to let us know that someone is on the way to get Pt.

## 2017-03-08 NOTE — ED Provider Notes (Signed)
[facility] EMERGENCY DEPARTMENT Provider Note   CSN: [id number] Arrival date & time: 03/08/17  3329     History   Chief Complaint Chief Complaint  Patient presents with  . Chest Pain    HPI [name] is a 66 y.o. female.  Pt presents to the ED today with right sided cp.  Pt said she fell out of her wheelchair on Tuesday the 27th.  The pt had just gotten an epidural injection and felt weak in her legs.  Pt said she landed on her knees.  She has pain in her pelvis since the fall.  She has been able to ambulate using orthotics and a walker since the fall.  She woke up this morning with cp, but it hurts to take a breath and to move.      Past Medical History:  Diagnosis Date  . Allergic rhinitis    uses Nasonex daily as needed  . Allergy to angiotensin receptor blockers (ARB) 01/22/2014   Angioedema  . Anxiety    takes Ativan daily as needed  . Aspiration pneumonia ([place])   . Asthma    Albuterol inhaler and Neb daily as needed  . 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 [name], residual palpitations  . Chronic back pain    reason unknown  . Constipation   . DDD (degenerative disc disease), lumbar   . Depression    takes Zoloft daily as well as Cymbalta  . Diabetes mellitus    takes NOvolin daily  . Dysphagia   . Dysrhythmia    SVT ==ablation done by Dr. Rayann Heman 2007  . Esophageal reflux    takes Zantac daily  . History of bronchitis Dec 2014  . Hypertension    takes Losartan daily  . Hypothyroidism    takes Synthroid daily  . Joint pain   . Joint swelling    left ankle  . Low sodium syndrome   . Lumbar radiculopathy   . Seizures ([place]) 04-05-13   one time ran out of Primidone and had stopped it cold [place]  . Tremor, essential    takes Mysoline and Neurontin daily.  . Vocal cord dysfunction   . Weakness    in left arm    Patient Active Problem List   Diagnosis Date Noted  . CAP (community  acquired pneumonia) 04/21/2016  . History of colonic polyps   . Diverticulosis of colon without hemorrhage   . Other hemorrhoids   . [name]'s ring   . Mucosal abnormality of stomach   . Encounter for screening colonoscopy 02/21/2015  . Upper airway cough syndrome 01/18/2015  . OAB (overactive bladder) 12/30/2014  . Incontinence in female 12/25/2014  . MDD (major depressive disorder), recurrent severe, without psychosis ([place]) 12/14/2014  . Thrush of mouth and esophagus ([place]) 11/23/2014  . Depression with somatization 11/05/2014  . Type II diabetes mellitus with manifestations ([place]) 10/23/2014  . Visit for screening mammogram 08/21/2014  . Hypokalemia 05/30/2014  . Tinea corporis 05/30/2014  . Lumbar scoliosis 04/24/2014  . Obstructive sleep apnea 04/23/2014  . Allergy to angiotensin receptor blockers (ARB) 01/22/2014  . S/P deep brain stimulator placement 01/10/2014  . Essential tremor 12/04/2013  . Greater trochanteric bursitis of left hip 11/07/2013  . Spinal stenosis of lumbar region 10/30/2013  . Right thyroid nodule 10/12/2013  . CMC arthritis, thumb, degenerative 06/15/2013  . IBS (irritable bowel syndrome) 11/03/2012  . Esophageal stenosis 11/01/2012  .  Asthma, moderate persistent, poorly-controlled 09/01/2012  . Hypothyroidism 12/22/2011  . Hyperlipidemia with target LDL less than 100 06/22/2008  . Essential hypertension 09/04/2007  . TREMOR, ESSENTIAL 04/19/2007  . Seasonal and perennial allergic rhinitis 04/19/2007  . Esophageal reflux 04/19/2007    Past Surgical History:  Procedure Laterality Date  . ABLATION FOR AVNRT    . BARTHOLIN GLAND CYST EXCISION     x 2  . BIOPSY  03/04/2015   Procedure: BIOPSY (Duodenal, Gastric);  Surgeon: Daneil Dolin, MD;  Location: [facility] ORS;  Service: Endoscopy;;  . CESAREAN SECTION    . CHOLECYSTECTOMY    . COLONOSCOPY  01/16/2004   PJK:DTOIZT rectum/colon  . COLONOSCOPY WITH PROPOFOL N/A 03/04/2015   Dr.[name]- internal  hemorrhoids, pancolonic diverticulosis, colonic polyp= tubular adenoma  . ESOPHAGEAL DILATION N/A 03/04/2015   Procedure: ESOPHAGEAL DILATION WITH 54FR MALONEY DILATOR;  Surgeon: Daneil Dolin, MD;  Location: [facility] OR[facility];  Service: Endoscopy;  Laterality: N/A;  . ESOPHAGOGASTRODUODENOSCOPY (EGD) WITH ESOPHAGEAL DILATION  04/03/2002   IWP:YKDXIPJA'S ring, otherwise normal esophagus, status post dilation with 56 F/Normal stomach  . ESOPHAGOGASTRODUODENOSCOPY (EGD) WITH ESOPHAGEAL DILATION N/A 11/08/2012   Dr. [name]:schatzki's ring s/p dilation/hiatal hernia  . ESOPHAGOGASTRODUODENOSCOPY (EGD) WITH PROPOFOL N/A 03/04/2015   Dr.[name]- schatzki's ring, hiatal hernia, scattered erosions bx= chronic inactive gastritis  . HEMORRHOID BANDING  2017   Dr.[name]  . LEFT ANKLE LIGAMENT REPAIR     x 2  . LEFT ELBOW REPAIR    . MAXIMUM ACCESS (MAS)POSTERIOR LUMBAR INTERBODY FUSION (PLIF) 1 LEVEL N/A 04/24/2014   Procedure: Lumbar four-five Maximum access Surgery posterior lumbar interbody fusion;  Surgeon: [name], MD;  Location: [place] NEURO ORS;  Service: Neurosurgery;  Laterality: N/A;  Lumbar four-five Maximum access Surgery posterior lumbar interbody fusion  . PARTIAL HYSTERECTOMY    . POLYPECTOMY  03/04/2015   Procedure: POLYPECTOMY (descending colon);  Surgeon: Daneil Dolin, MD;  Location: [facility];  Service: Endoscopy;;  . PULSE GENERATOR IMPLANT Bilateral 12/15/2013   Procedure: BILATERAL PULSE GENERATOR IMPLANT;  Surgeon: [name], MD;  Location: [place] NEURO ORS;  Service: Neurosurgery;  Laterality: Bilateral;  BILATERAL  . RIGHT BREAST CYST     benign  . SUBTHALAMIC STIMULATOR INSERTION Bilateral 12/04/2013   Procedure: Bilateral Deep brain stimulator placement;  Surgeon: [name], MD;  Location: [place] NEURO ORS;  Service: Neurosurgery;  Laterality: Bilateral;  Bilateral Deep brain stimulator placement  . TONSILLECTOMY      OB History    Gravida Para Term Preterm AB Living   2 2 2     1    SAB TAB  Ectopic Multiple Live Births                   Home Medications    Prior to Admission medications   Medication Sig Start Date End Date Taking? Authorizing Provider  acetaminophen (TYLENOL) 325 MG tablet Take 650 mg by mouth 3 (three) times daily.   Yes [provider]  albuterol (PROVENTIL HFA;VENTOLIN HFA) 108 (90 Base) MCG/ACT inhaler Inhale 1-2 puffs into the lungs every 6 (six) hours as needed for wheezing or shortness of breath.   Yes [provider]  albuterol (PROVENTIL) (2.5 MG/3ML) 0.083% nebulizer solution Take 2.5 mg by nebulization every 6 (six) hours as needed for wheezing or shortness of breath.   Yes [provider]  aspirin 81 MG chewable tablet Chew 81 mg by mouth daily.   Yes [provider]  baclofen (LIORESAL) 10 MG tablet  Take 10 mg by mouth at bedtime.   Yes [provider]  diphenhydrAMINE (BENADRYL) 25 MG tablet Take 1 tablet (25 mg total) by mouth 2 (two) times daily as needed. 03/04/17  Yes Young, Tarri Fuller D, MD  divalproex (DEPAKOTE) 500 MG DR tablet Take 1 tablet by mouth 2 (two) times daily. 11/24/16 03/08/17 Yes [provider]  fluticasone furoate-vilanterol (BREO ELLIPTA) 100-25 MCG/INH AEPB Inhale 1 puff into the lungs daily. 12/17/16  Yes Young, Tarri Fuller D, MD  gabapentin (NEURONTIN) 600 MG tablet Take 600 mg by mouth at bedtime.    Yes [provider]  guaifenesin (ROBITUSSIN) 100 MG/5ML syrup Take 200 mg by mouth every 6 (six) hours as needed for cough.   Yes [provider]  HYDROcodone-acetaminophen (NORCO/VICODIN) 5-325 MG tablet Take 1 tablet by mouth daily. At 2:00 pm. 10/01/16  Yes [provider]  insulin glargine (LANTUS) 100 UNIT/ML injection Inject 30 Units into the skin at bedtime.   Yes [provider]  ipratropium (ATROVENT) 0.03 % nasal spray 1-2 puffs each nostril 3 times daily, before meals 01/12/17  Yes Young, Clinton D, MD  ipratropium-albuterol (DUONEB)  0.5-2.5 (3) MG/3ML SOLN 1 neb every 4 hours for wheeze/ cough. DX: J45.40 Patient taking differently: Take 3 mLs by nebulization every 6 (six) hours as needed (wheeze/cough). DX: J45.40 12/24/16  Yes Young, Tarri Fuller D, MD  JANUVIA 100 MG tablet Take 1 tablet by mouth daily. 11/26/16  Yes [provider]  levothyroxine (SYNTHROID, LEVOTHROID) 75 MCG tablet TAKE (1) TABLET BY MOUTH ONCE DAILY BEFORE BREAKFAST. 01/12/15  Yes Janith Lima, MD  loperamide (IMODIUM A-D) 2 MG tablet Take 2 mg by mouth 4 (four) times daily as needed for diarrhea or loose stools.    Yes [provider]  magnesium hydroxide (MILK OF MAGNESIA) 400 MG/5ML suspension Take 30 mLs by mouth daily as needed for mild constipation.   Yes [provider]  Menthol, Topical Analgesic, (BIOFREEZE) 4 % GEL Apply 1 application topically 2 (two) times daily. Apply to lower back and base of right thumb twice daily.   Yes [provider]  metoprolol (LOPRESSOR) 50 MG tablet Take 1 tablet by mouth daily. 04/15/16  Yes [provider]  mirabegron ER (MYRBETRIQ) 25 MG TB24 tablet Take 25 mg by mouth daily.   Yes [provider]  Multiple Vitamins-Minerals (MULTIVITAMINS THER. W/MINERALS) TABS tablet TAKE ONE TABLET BY MOUTH ONCE DAILY. 04/16/15  Yes Janith Lima, MD  NOVOLOG FLEXPEN 100 UNIT/ML FlexPen Inject 12 Units into the skin 3 (three) times daily with meals. Sliding scale 07/01/15  Yes [provider]  Omega 3 1000 MG CAPS TAKE (1) CAPSULE BY MOUTH ONCE DAILY. 04/16/15  Yes Janith Lima, MD  omeprazole (PRILOSEC) 20 MG capsule Take 1 capsule (20 mg total) by mouth 2 (two) times daily. 12/09/16  Yes Carlis Stable, NP  OXYCONTIN 10 MG 12 hr tablet Take 10 mg by mouth every 12 (twelve) hours.  09/23/16  Yes [provider]  polyethylene glycol (MIRALAX / GLYCOLAX) packet Take 17 g by mouth daily.  11/26/16  Yes [provider]  polyethylene glycol (MIRALAX / GLYCOLAX)  packet Take 17 g by mouth daily as needed for mild constipation.   Yes [provider]  potassium chloride SA (K-DUR,KLOR-CON) 20 MEQ tablet Take 1 tablet by mouth daily. 11/26/16  Yes [provider]  primidone (MYSOLINE) 50 MG tablet Take 2 tablets (100 mg total) by mouth 2 (two)  times daily. Patient taking differently: Take 150 mg by mouth 3 (three) times daily.  03/06/15  Yes Tat, Eustace Quail, DO  QUEtiapine (SEROQUEL) 100 MG tablet Take 1 tablet by mouth at bedtime. 11/25/16  Yes [provider]  sodium chloride (OCEAN) 0.65 % SOLN nasal spray Place 2 sprays into both nostrils 3 (three) times daily.   Yes [provider]  sodium chloride 1 g tablet Take 1 g by mouth 2 (two) times daily.   Yes [provider]  sucralfate (CARAFATE) 1 g tablet Take 1 g by mouth 2 (two) times daily.  04/24/16  Yes [provider]  triamterene-hydrochlorothiazide (MAXZIDE-25) 37.5-25 MG tablet Take 1 tablet by mouth daily. 11/26/16  Yes [provider]    Family History Family History  Problem Relation Age of Onset  . Lung cancer Father        DIED AGE 59 LUNG CA  . Alcohol abuse Father   . Anxiety disorder Father   . Depression Father   . COPD Mother        DIED AGE 38,MRSA,COPD,PNEUMONIA  . Pneumonia Mother        DIED AGE 38,MRSA,COPD,PNEUMONIA  . Supraventricular tachycardia Mother   . Anxiety disorder Mother   . Depression Mother   . Alcohol abuse Mother   . Heart disease Sister        DIED AGE 52, SMOKER,?HEART  . COPD Brother        AGE 15  . Colon cancer Neg Hx     Social History Social History   Tobacco Use  . Smoking status: Former Smoker    Packs/day: 0.30    Years: 10.00    Pack years: 3.00    Types: Cigarettes    Last attempt to quit: 04/23/1993    Years since quitting: 23.8  . Smokeless tobacco: Never Used  . Tobacco comment: quit smoking 25+yrs ago  Substance Use Topics  . Alcohol use: No    Alcohol/week: 0.0 oz    . Drug use: No     Allergies   Invokana [canagliflozin]; Losartan; Metformin and related; Latex; and Oxycodone-acetaminophen   Review of Systems Review of Systems  Musculoskeletal:       Chest wall pain Bilateral pelvis pain  All other systems reviewed and are negative.    Physical Exam Updated Vital Signs BP (!) 141/82   Pulse 87   Temp 99.3 F (37.4 C) (Oral)   Resp (!) 26   Ht 5' 4.5" (1.638 m)   Wt 81.6 kg (180 lb)   SpO2 100%   BMI 30.42 kg/m   Physical Exam  Constitutional: She is oriented to person, place, and time. She appears well-developed and well-nourished.  HENT:  Head: Normocephalic and atraumatic.  Eyes: EOM are normal. Pupils are equal, round, and reactive to light.  Neck: Normal range of motion. Neck supple.  Cardiovascular: Normal rate, regular rhythm, intact distal pulses and normal pulses.  Pulmonary/Chest: Effort normal and breath sounds normal.  Abdominal: Soft. Bowel sounds are normal.  Musculoskeletal: Normal range of motion.  Neurological: She is alert and oriented to person, place, and time.  Skin: Skin is warm and dry. Capillary refill takes less than 2 seconds.  Psychiatric: She has a normal mood and affect. Her behavior is normal.  Nursing note and vitals reviewed.    ED Treatments / Results  Labs (all labs ordered are listed, but only abnormal results are displayed) Labs Reviewed  CBC - Abnormal; Notable for  the following components:      Result Value   WBC 11.7 (*)    All other components within normal limits  COMPREHENSIVE METABOLIC PANEL - Abnormal; Notable for the following components:   Sodium 127 (*)    Chloride 93 (*)    Glucose, Bld 174 (*)    All other components within normal limits  TROPONIN I  URINALYSIS, ROUTINE W REFLEX MICROSCOPIC    EKG  EKG Interpretation  Date/Time:  Monday March 08 2017 08:26:04 EST Ventricular Rate:  91 PR Interval:    QRS Duration: 98 QT Interval:  352 QTC  Calculation: 433 R Axis:   69 Text Interpretation:  Sinus rhythm Nonspecific T abnormalities, lateral leads No significant change since last tracing Confirmed by Isla Pence [phone]) on 03/08/2017 8:55:11 AM       Radiology Dg Ribs Unilateral W/chest Right  Result Date: 03/08/2017 CLINICAL DATA:  Anterior right rib pain after fall 6 days ago. Initial encounter. EXAM: RIGHT RIBS AND CHEST - 3+ VIEW COMPARISON:  11/16/2016 chest x-ray FINDINGS: No fracture or other bone lesions are seen involving the ribs. There is no evidence of pneumothorax or pleural effusion. Lungs are clear. Normal heart size and mediastinal contours. Deep brain stimulator with bilateral battery packs over the chest. IMPRESSION: Negative. Electronically Signed   By: Monte Fantasia M.D.   On: 03/08/2017 08:21   Dg Pelvis 1-2 Views  Result Date: 03/08/2017 CLINICAL DATA:  Pain.  Recent fall EXAM: PELVIS - 1-2 VIEW COMPARISON:  CT pelvis May 04, 2015 FINDINGS: There is no evidence of pelvic fracture or dislocation. There is moderate symmetric narrowing of both hip joints. No erosive change. There is postoperative change in the lower lumbar spine. IMPRESSION: Symmetric narrowing both hip joints, moderate. Postoperative change lower lumbar spine. No acute fracture or dislocation. Electronically Signed   By: Lowella Grip III M.D.   On: 03/08/2017 08:21   Dg Sacrum/coccyx  Result Date: 03/08/2017 CLINICAL DATA:  Pain following recent fall EXAM: SACRUM AND COCCYX - 2+ VIEW COMPARISON:  CT pelvis May 04, 2015 FINDINGS: Frontal, tilt frontal, and lateral views were obtained it. There is no acute fracture or diastases. Sacroiliac joints appear symmetric bilaterally. There is postoperative change at L4 and L5 with degenerative type change in the lower lumbar spine. IMPRESSION: No fracture or diastases. Sacroiliac joints appear unremarkable. Postoperative change in the lower lumbar spine. Electronically Signed   By: Lowella Grip III M.D.   On: 03/08/2017 08:23    Procedures Procedures (including critical care time)  Medications Ordered in ED Medications  gi cocktail (Maalox,Lidocaine,Donnatal) (not administered)     Initial Impression / Assessment and Plan / ED Course  I have reviewed the triage vital signs and the nursing notes.  Pertinent labs & imaging results that were available during my care of the patient were reviewed by me and considered in my medical decision making (see chart for details).  Pt looks good.  Pt has cp when she moves or when it's touched, so this does not sound cardiac.  No fractures.  Pt has been ambulatory with walker since fall.  Pt is stable for d/c.  Final Clinical Impressions(s) / ED Diagnoses   Final diagnoses:  Chest wall pain  Ambulatory dysfunction  Contusion of coccyx, initial encounter    ED Discharge Orders    None       Isla Pence, MD 03/08/17 1115

## 2017-03-08 NOTE — ED Notes (Signed)
[facility].  Pt sitting on side of bed eating.  [place] will send someone to pick her up.

## 2017-03-10 ENCOUNTER — Ambulatory Visit: Payer: Medicare Other | Admitting: Podiatry

## 2017-03-10 DIAGNOSIS — F310 Bipolar disorder, current episode hypomanic: Secondary | ICD-10-CM | POA: Diagnosis not present

## 2017-03-10 DIAGNOSIS — E039 Hypothyroidism, unspecified: Secondary | ICD-10-CM | POA: Diagnosis not present

## 2017-03-10 DIAGNOSIS — J42 Unspecified chronic bronchitis: Secondary | ICD-10-CM | POA: Diagnosis not present

## 2017-03-10 DIAGNOSIS — E119 Type 2 diabetes mellitus without complications: Secondary | ICD-10-CM | POA: Diagnosis not present

## 2017-03-10 DIAGNOSIS — E1165 Type 2 diabetes mellitus with hyperglycemia: Secondary | ICD-10-CM | POA: Diagnosis not present

## 2017-03-10 DIAGNOSIS — D649 Anemia, unspecified: Secondary | ICD-10-CM | POA: Diagnosis not present

## 2017-03-10 DIAGNOSIS — E559 Vitamin D deficiency, unspecified: Secondary | ICD-10-CM | POA: Diagnosis not present

## 2017-03-10 DIAGNOSIS — M810 Age-related osteoporosis without current pathological fracture: Secondary | ICD-10-CM | POA: Diagnosis not present

## 2017-03-11 DIAGNOSIS — I10 Essential (primary) hypertension: Secondary | ICD-10-CM | POA: Diagnosis not present

## 2017-03-11 DIAGNOSIS — E1165 Type 2 diabetes mellitus with hyperglycemia: Secondary | ICD-10-CM | POA: Diagnosis not present

## 2017-03-11 DIAGNOSIS — M6281 Muscle weakness (generalized): Secondary | ICD-10-CM | POA: Diagnosis not present

## 2017-03-11 DIAGNOSIS — M5416 Radiculopathy, lumbar region: Secondary | ICD-10-CM | POA: Diagnosis not present

## 2017-03-11 DIAGNOSIS — R0981 Nasal congestion: Secondary | ICD-10-CM | POA: Diagnosis not present

## 2017-03-11 DIAGNOSIS — F319 Bipolar disorder, unspecified: Secondary | ICD-10-CM | POA: Diagnosis not present

## 2017-03-11 DIAGNOSIS — S330XXS Traumatic rupture of lumbar intervertebral disc, sequela: Secondary | ICD-10-CM | POA: Diagnosis not present

## 2017-03-11 DIAGNOSIS — S300XXS Contusion of lower back and pelvis, sequela: Secondary | ICD-10-CM | POA: Diagnosis not present

## 2017-03-11 DIAGNOSIS — J42 Unspecified chronic bronchitis: Secondary | ICD-10-CM | POA: Diagnosis not present

## 2017-03-11 DIAGNOSIS — E119 Type 2 diabetes mellitus without complications: Secondary | ICD-10-CM | POA: Diagnosis not present

## 2017-03-12 ENCOUNTER — Encounter: Payer: Self-pay | Admitting: Podiatry

## 2017-03-12 ENCOUNTER — Ambulatory Visit (INDEPENDENT_AMBULATORY_CARE_PROVIDER_SITE_OTHER): Payer: Medicare Other | Admitting: Podiatry

## 2017-03-12 DIAGNOSIS — M79675 Pain in left toe(s): Secondary | ICD-10-CM | POA: Diagnosis not present

## 2017-03-12 DIAGNOSIS — B351 Tinea unguium: Secondary | ICD-10-CM | POA: Diagnosis not present

## 2017-03-12 DIAGNOSIS — M79674 Pain in right toe(s): Secondary | ICD-10-CM

## 2017-03-12 NOTE — Progress Notes (Signed)
Subjective:   Patient ID: [name], female   DOB: 66 y.o.   MRN: [id number]   HPI Patient presents with nail disease 1-5 both feet that she cannot cut and history of AFO braces that she wanted checked.   ROS      Objective:  Physical Exam  Neurovascular status unchanged.  Patient found to have nail disease 1-5 both feet with incurvation and inability for her to cut along with patient noted to have AFO braces that are fitted well.     Assessment:  Mycotic nail infection with incurvation and AFO braces that appear to be well fitted.     Plan:  Debrided painful nailbeds 1-5 both feet with no iatrogenic bleeding and discussed AFO braces which we will continue with the current time

## 2017-03-13 DIAGNOSIS — M6281 Muscle weakness (generalized): Secondary | ICD-10-CM | POA: Diagnosis not present

## 2017-03-13 DIAGNOSIS — F319 Bipolar disorder, unspecified: Secondary | ICD-10-CM | POA: Diagnosis not present

## 2017-03-13 DIAGNOSIS — M5416 Radiculopathy, lumbar region: Secondary | ICD-10-CM | POA: Diagnosis not present

## 2017-03-13 DIAGNOSIS — J42 Unspecified chronic bronchitis: Secondary | ICD-10-CM | POA: Diagnosis not present

## 2017-03-13 DIAGNOSIS — E119 Type 2 diabetes mellitus without complications: Secondary | ICD-10-CM | POA: Diagnosis not present

## 2017-03-13 DIAGNOSIS — I10 Essential (primary) hypertension: Secondary | ICD-10-CM | POA: Diagnosis not present

## 2017-03-15 DIAGNOSIS — Z888 Allergy status to other drugs, medicaments and biological substances status: Secondary | ICD-10-CM | POA: Diagnosis not present

## 2017-03-15 DIAGNOSIS — G8929 Other chronic pain: Secondary | ICD-10-CM | POA: Diagnosis not present

## 2017-03-15 DIAGNOSIS — E039 Hypothyroidism, unspecified: Secondary | ICD-10-CM | POA: Diagnosis not present

## 2017-03-15 DIAGNOSIS — E1165 Type 2 diabetes mellitus with hyperglycemia: Secondary | ICD-10-CM | POA: Diagnosis not present

## 2017-03-15 DIAGNOSIS — F319 Bipolar disorder, unspecified: Secondary | ICD-10-CM | POA: Diagnosis not present

## 2017-03-15 DIAGNOSIS — Z794 Long term (current) use of insulin: Secondary | ICD-10-CM | POA: Diagnosis not present

## 2017-03-15 DIAGNOSIS — Z9889 Other specified postprocedural states: Secondary | ICD-10-CM | POA: Diagnosis not present

## 2017-03-15 DIAGNOSIS — I200 Unstable angina: Secondary | ICD-10-CM | POA: Diagnosis not present

## 2017-03-15 DIAGNOSIS — E871 Hypo-osmolality and hyponatremia: Secondary | ICD-10-CM | POA: Diagnosis not present

## 2017-03-15 DIAGNOSIS — R079 Chest pain, unspecified: Secondary | ICD-10-CM | POA: Diagnosis not present

## 2017-03-15 DIAGNOSIS — I10 Essential (primary) hypertension: Secondary | ICD-10-CM | POA: Diagnosis not present

## 2017-03-15 DIAGNOSIS — Z66 Do not resuscitate: Secondary | ICD-10-CM | POA: Diagnosis not present

## 2017-03-15 DIAGNOSIS — Z79899 Other long term (current) drug therapy: Secondary | ICD-10-CM | POA: Diagnosis not present

## 2017-03-15 DIAGNOSIS — K219 Gastro-esophageal reflux disease without esophagitis: Secondary | ICD-10-CM | POA: Diagnosis not present

## 2017-03-15 DIAGNOSIS — M549 Dorsalgia, unspecified: Secondary | ICD-10-CM | POA: Diagnosis not present

## 2017-03-15 DIAGNOSIS — E1142 Type 2 diabetes mellitus with diabetic polyneuropathy: Secondary | ICD-10-CM | POA: Diagnosis not present

## 2017-03-15 DIAGNOSIS — Z87891 Personal history of nicotine dependence: Secondary | ICD-10-CM | POA: Diagnosis not present

## 2017-03-15 DIAGNOSIS — J449 Chronic obstructive pulmonary disease, unspecified: Secondary | ICD-10-CM | POA: Diagnosis not present

## 2017-03-15 DIAGNOSIS — R251 Tremor, unspecified: Secondary | ICD-10-CM | POA: Diagnosis not present

## 2017-03-16 DIAGNOSIS — R079 Chest pain, unspecified: Secondary | ICD-10-CM | POA: Diagnosis not present

## 2017-03-17 ENCOUNTER — Ambulatory Visit: Payer: Self-pay | Admitting: Nurse Practitioner

## 2017-03-18 ENCOUNTER — Telehealth: Payer: Self-pay

## 2017-03-18 ENCOUNTER — Other Ambulatory Visit: Payer: Self-pay

## 2017-03-18 ENCOUNTER — Encounter: Payer: Self-pay | Admitting: Nurse Practitioner

## 2017-03-18 ENCOUNTER — Ambulatory Visit (INDEPENDENT_AMBULATORY_CARE_PROVIDER_SITE_OTHER): Payer: Medicare Other | Admitting: Nurse Practitioner

## 2017-03-18 ENCOUNTER — Encounter: Payer: Self-pay | Admitting: Gastroenterology

## 2017-03-18 VITALS — BP 121/77 | HR 92 | Temp 98.0°F | Ht 64.5 in

## 2017-03-18 DIAGNOSIS — K222 Esophageal obstruction: Secondary | ICD-10-CM

## 2017-03-18 DIAGNOSIS — K5903 Drug induced constipation: Secondary | ICD-10-CM | POA: Diagnosis not present

## 2017-03-18 DIAGNOSIS — K219 Gastro-esophageal reflux disease without esophagitis: Secondary | ICD-10-CM | POA: Diagnosis not present

## 2017-03-18 DIAGNOSIS — R1310 Dysphagia, unspecified: Secondary | ICD-10-CM

## 2017-03-18 DIAGNOSIS — K5900 Constipation, unspecified: Secondary | ICD-10-CM | POA: Insufficient documentation

## 2017-03-18 MED ORDER — PANTOPRAZOLE SODIUM 40 MG PO TBEC: 40.0000 mg | DELAYED_RELEASE_TABLET | Freq: Every day | ORAL | 3 refills | Status: DC

## 2017-03-18 MED ORDER — NALOXEGOL OXALATE 25 MG PO TABS: 25.0000 mg | ORAL_TABLET | Freq: Every day | ORAL | 0 refills | Status: DC

## 2017-03-18 NOTE — Addendum Note (Signed)
Addended by: Gordy Levan, ERIC A on: 03/18/2017 11:55 AM   Modules accepted: Orders

## 2017-03-18 NOTE — Telephone Encounter (Signed)
Written Rx sent to home by DS.

## 2017-03-18 NOTE — Telephone Encounter (Signed)
Pt said prescriptions should have been sent to [facility] in [place]. I have changed the pharmacy for that. Randall Hiss, can you please change. Thanks!

## 2017-03-18 NOTE — Telephone Encounter (Signed)
T/C from [place], one of the nurses at the [facility]. She said she needs a copy of the prescriptions for her records. She said please fax to her @ [phone] and she will fax to [facility]. Walden Field, NP has written the prescriptions and they have been faxed.

## 2017-03-18 NOTE — Telephone Encounter (Signed)
[place], spoke to [facility], EGD/-/+Dil w/Propofol with RMR scheduled for 05/06/17 at 10:15am. Will call back with pre-op appt.  Pre-op appt 04/29/17 at 10:00am. [place] and informed Santiago Glad. Instructions for procedure/pre-op appt letter faxed to [phone].

## 2017-03-18 NOTE — Assessment & Plan Note (Signed)
History of esophageal stenosis with last EGD in 2016.  She is having more esophageal spasms and delayed transit.  She has not had regurgitation yet.  She is requesting another endoscopy with possible dilation to further evaluate and improve her dysphasia symptoms.  No other red flag/warning signs or symptoms.  Her GERD is not well controlled and we are actively working on better control as per below.  Return for follow-up in 3 months.  Proceed with EGD with Dr. Gala Romney in near future: the risks, benefits, and alternatives have been discussed with the patient in detail. The patient states understanding and desires to proceed.  Patient has a history of bipolar disorder.  She is currently on Neurontin, hydrocodone, Myrbetriq, OxyContin, Seroquel.  Denies alcohol and drug use.  No other anticoagulants, anxiolytics, chronic pain medications, or antidepressants.  We will plan for the procedure on propofol/MAC to promote adequate sedation as was required for her last procedure.

## 2017-03-18 NOTE — Progress Notes (Signed)
Referring Provider: Abran Richard, MD Primary Care Physician:  Abran Richard, MD Primary GI:  Dr. Gala Romney  Chief Complaint  Patient presents with  . Gastroesophageal Reflux    f/u, c/o heartburn  . Constipation    HPI:   [name] is a 66 y.o. female who presents for follow-up on GERD and constipation.  The patient was last seen in our office 12/09/2016 for GERD and IBS with constipation.  Potential but unconfirmed history of duodenal ulcer.  Last EGD completed 03/04/2015 for dysphagia and refractory GERD symptoms which found noncritical [name]'s ring status post dilation, hiatal hernia, abnormal stomach and duodenum status post biopsy. Surgical pathology found the biopsies of the stomach to be chronic inactive gastritis. The biopsies of the duodenum were found to be duodenal mucosa with regenerative changes. Recommended continue Protonix 40 mg daily.  Recommended continue Protonix 40 mg daily.  At her last visit she noted a lot of reflux and belching, drinks 1-2 sodas a month and 4 cups of coffee a day.  No NSAIDs or aspirin powders.  Epigastric pain and throat burning.  Constipation on pain medications currently taking MiraLAX with a bowel movement every 5 days, straining, hard stools when not taking MiraLAX and she noted she had not been taking it for a couple weeks.  No other GI symptoms.  Recommended increasing PPI to twice daily, MiraLAX every day, decrease coffee consumption, avoid NSAIDs, avoid other triggers.  Follow-up in 3 months.  MiraLAX ineffective her nursing facility can call and we can recommend other options.  Today she states she's having continued constipation. Typically has daily bowel movements when she eats bran cereal but still with straining and hard stools. Still with GERD symptoms about every other day. Still on Omeprazole twice daily and Carafate twice daily. Denies hematochezia, melena, N/V. Is on daily ASA 81 mg. States she feels her previous PPI  (Protonix) worked better and is requesting to go back to that. She has been having solid food dysphagia with esophageal spasming and delayed transit. Denies chest pain, dyspnea, dizziness, lightheadedness, syncope, near syncope. Denies any other upper or lower GI symptoms.  She was recently in the hosptial at [facility] for chest pain/observation and ruled out for acute cardiac etiology. Only had seizure once when she ran out of her medications and stopped them. One in "many many years"  Past Medical History:  Diagnosis Date  . Allergic rhinitis    uses Nasonex daily as needed  . Allergy to angiotensin receptor blockers (ARB) 01/22/2014   Angioedema  . Anxiety    takes Ativan daily as needed  . Aspiration pneumonia ([place])   . Asthma    Albuterol inhaler and Neb daily as needed  . 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 [name], residual palpitations  . Chronic back pain    reason unknown  . Constipation   . DDD (degenerative disc disease), lumbar   . Depression    takes Zoloft daily as well as Cymbalta  . Diabetes mellitus    takes NOvolin daily  . Dysphagia   . Dysrhythmia    SVT ==ablation done by Dr. Rayann Heman 2007  . Esophageal reflux    takes Zantac daily  . History of bronchitis Dec 2014  . Hypertension    takes Losartan daily  . Hypothyroidism    takes Synthroid daily  . Joint pain   . Joint swelling    left ankle  .  Low sodium syndrome   . Lumbar radiculopathy   . Seizures ([place]) 04-05-13   one time ran out of Primidone and had stopped it cold [place]  . Tremor, essential    takes Mysoline and Neurontin daily.  . Vocal cord dysfunction   . Weakness    in left arm    Past Surgical History:  Procedure Laterality Date  . ABLATION FOR AVNRT    . BARTHOLIN GLAND CYST EXCISION     x 2  . BIOPSY  03/04/2015   Procedure: BIOPSY (Duodenal, Gastric);  Surgeon: Daneil Dolin, MD;  Location: [facility];  Service: Endoscopy;;  .  CESAREAN SECTION    . CHOLECYSTECTOMY    . COLONOSCOPY  01/16/2004   DGL:OVFIEP rectum/colon  . COLONOSCOPY WITH PROPOFOL N/A 03/04/2015   Dr.[name]- internal hemorrhoids, pancolonic diverticulosis, colonic polyp= tubular adenoma  . ESOPHAGEAL DILATION N/A 03/04/2015   Procedure: ESOPHAGEAL DILATION WITH 54FR MALONEY DILATOR;  Surgeon: Daneil Dolin, MD;  Location: [facility];  Service: Endoscopy;  Laterality: N/A;  . ESOPHAGOGASTRODUODENOSCOPY (EGD) WITH ESOPHAGEAL DILATION  04/03/2002   PIR:JJOACZYS'A ring, otherwise normal esophagus, status post dilation with 56 F/Normal stomach  . ESOPHAGOGASTRODUODENOSCOPY (EGD) WITH ESOPHAGEAL DILATION N/A 11/08/2012   Dr. [name]:schatzki's ring s/p dilation/hiatal hernia  . ESOPHAGOGASTRODUODENOSCOPY (EGD) WITH PROPOFOL N/A 03/04/2015   Dr.[name]- schatzki's ring, hiatal hernia, scattered erosions bx= chronic inactive gastritis  . HEMORRHOID BANDING  2017   Dr.[name]  . LEFT ANKLE LIGAMENT REPAIR     x 2  . LEFT ELBOW REPAIR    . MAXIMUM ACCESS (MAS)POSTERIOR LUMBAR INTERBODY FUSION (PLIF) 1 LEVEL N/A 04/24/2014   Procedure: Lumbar four-five Maximum access Surgery posterior lumbar interbody fusion;  Surgeon: [name], MD;  Location: [facility];  Service: Neurosurgery;  Laterality: N/A;  Lumbar four-five Maximum access Surgery posterior lumbar interbody fusion  . PARTIAL HYSTERECTOMY    . POLYPECTOMY  03/04/2015   Procedure: POLYPECTOMY (descending colon);  Surgeon: Daneil Dolin, MD;  Location: [facility];  Service: Endoscopy;;  . PULSE GENERATOR IMPLANT Bilateral 12/15/2013   Procedure: BILATERAL PULSE GENERATOR IMPLANT;  Surgeon: [name], MD;  Location: [place];  Service: Neurosurgery;  Laterality: Bilateral;  BILATERAL  . RIGHT BREAST CYST     benign  . SUBTHALAMIC STIMULATOR INSERTION Bilateral 12/04/2013   Procedure: Bilateral Deep brain stimulator placement;  Surgeon: [name], MD;  Location: [place];  Service: Neurosurgery;   Laterality: Bilateral;  Bilateral Deep brain stimulator placement  . TONSILLECTOMY      Current Outpatient Medications  Medication Sig Dispense Refill  . acetaminophen (TYLENOL) 325 MG tablet Take 650 mg by mouth 3 (three) times daily.    Marland Kitchen albuterol (PROVENTIL HFA;VENTOLIN HFA) 108 (90 Base) MCG/ACT inhaler Inhale 1-2 puffs into the lungs every 6 (six) hours as needed for wheezing or shortness of breath.    Marland Kitchen albuterol (PROVENTIL) (2.5 MG/3ML) 0.083% nebulizer solution Take 2.5 mg by nebulization every 6 (six) hours as needed for wheezing or shortness of breath.    Marland Kitchen aspirin 81 MG chewable tablet Chew 81 mg by mouth daily.    . baclofen (LIORESAL) 10 MG tablet Take 10 mg by mouth at bedtime.    . diphenhydrAMINE (BENADRYL) 25 MG tablet Take 1 tablet (25 mg total) by mouth 2 (two) times daily as needed. 60 tablet 12  . divalproex (DEPAKOTE) 500 MG DR tablet Take 1 tablet by mouth 2 (two) times daily.    . fluticasone furoate-vilanterol (BREO  ELLIPTA) 100-25 MCG/INH AEPB Inhale 1 puff into the lungs daily. 1 each 11  . gabapentin (NEURONTIN) 600 MG tablet Take 600 mg by mouth at bedtime. 300mg  every morning    . guaifenesin (ROBITUSSIN) 100 MG/5ML syrup Take 200 mg by mouth every 6 (six) hours as needed for cough.    Marland Kitchen HYDROcodone-acetaminophen (NORCO/VICODIN) 5-325 MG tablet Take 1 tablet by mouth daily. At 2:00 pm.    . insulin glargine (LANTUS) 100 UNIT/ML injection Inject 30 Units into the skin at bedtime.    Marland Kitchen ipratropium (ATROVENT) 0.03 % nasal spray 1-2 puffs each nostril 3 times daily, before meals 30 mL 11  . JANUVIA 100 MG tablet Take 1 tablet by mouth daily.    Marland Kitchen levothyroxine (SYNTHROID, LEVOTHROID) 75 MCG tablet TAKE (1) TABLET BY MOUTH ONCE DAILY BEFORE BREAKFAST. 30 tablet 5  . loperamide (IMODIUM A-D) 2 MG tablet Take 2 mg by mouth 4 (four) times daily as needed for diarrhea or loose stools.     . magnesium hydroxide (MILK OF MAGNESIA) 400 MG/5ML suspension Take 30 mLs by mouth  daily as needed for mild constipation.    . Menthol, Topical Analgesic, (BIOFREEZE) 4 % GEL Apply 1 application topically 2 (two) times daily. Apply to lower back and base of right thumb twice daily.    . metoprolol (LOPRESSOR) 50 MG tablet Take 1 tablet by mouth daily.    . mirabegron ER (MYRBETRIQ) 25 MG TB24 tablet Take 25 mg by mouth daily.    . Multiple Vitamins-Minerals (MULTIVITAMINS THER. W/MINERALS) TABS tablet TAKE ONE TABLET BY MOUTH ONCE DAILY. 30 each 11  . NOVOLOG FLEXPEN 100 UNIT/ML FlexPen Inject 12 Units into the skin 3 (three) times daily with meals. Sliding scale    . Omega 3 1000 MG CAPS TAKE (1) CAPSULE BY MOUTH ONCE DAILY. 30 capsule 11  . omeprazole (PRILOSEC) 20 MG capsule Take 1 capsule (20 mg total) by mouth 2 (two) times daily. 60 capsule 3  . OXYCONTIN 10 MG 12 hr tablet Take 10 mg by mouth every 12 (twelve) hours.     . polyethylene glycol (MIRALAX / GLYCOLAX) packet Take 17 g by mouth daily.     . potassium chloride SA (K-DUR,KLOR-CON) 20 MEQ tablet Take 1 tablet by mouth daily.    . primidone (MYSOLINE) 50 MG tablet Take 2 tablets (100 mg total) by mouth 2 (two) times daily. (Patient taking differently: Take 150 mg by mouth 3 (three) times daily. ) 360 tablet 1  . QUEtiapine (SEROQUEL) 100 MG tablet Take 1 tablet by mouth at bedtime.    . sodium chloride 1 g tablet Take 1 g by mouth 2 (two) times daily.    . sucralfate (CARAFATE) 1 g tablet Take 1 g by mouth 2 (two) times daily.     Marland Kitchen triamterene-hydrochlorothiazide (MAXZIDE-25) 37.5-25 MG tablet Take 1 tablet by mouth daily.    . sodium chloride (OCEAN) 0.65 % SOLN nasal spray Place 2 sprays into both nostrils 3 (three) times daily.     No current facility-administered medications for this visit.     Allergies as of 03/18/2017 - Review Complete 03/18/2017  Allergen Reaction Noted  . Invokana [canagliflozin] Other (See Comments) 12/05/2014  . Losartan  01/22/2014  . Metformin and related Diarrhea 01/18/2013  .  Latex Other (See Comments) 07/02/2014  . Oxycodone-acetaminophen Itching 05/11/2011    Family History  Problem Relation Age of Onset  . Lung cancer Father        DIED  AGE 43 LUNG CA  . Alcohol abuse Father   . Anxiety disorder Father   . Depression Father   . COPD Mother        DIED AGE 72,MRSA,COPD,PNEUMONIA  . Pneumonia Mother        DIED AGE 72,MRSA,COPD,PNEUMONIA  . Supraventricular tachycardia Mother   . Anxiety disorder Mother   . Depression Mother   . Alcohol abuse Mother   . Heart disease Sister        DIED AGE 45, SMOKER,?HEART  . COPD Brother        AGE 4  . Colon cancer Neg Hx     Social History   Socioeconomic History  . Marital status: Married    Spouse name: None  . Number of children: None  . Years of education: None  . Highest education level: None  Social Needs  . Financial resource strain: None  . Food insecurity - worry: None  . Food insecurity - inability: None  . Transportation needs - medical: None  . Transportation needs - non-medical: None  Occupational History  . Occupation: retired  Tobacco Use  . Smoking status: Former Smoker    Packs/day: 0.30    Years: 10.00    Pack years: 3.00    Types: Cigarettes    Last attempt to quit: 04/23/1993    Years since quitting: 23.9  . Smokeless tobacco: Never Used  . Tobacco comment: quit smoking 25+yrs ago  Substance and Sexual Activity  . Alcohol use: No    Alcohol/week: 0.0 oz  . Drug use: No  . Sexual activity: No  Other Topics Concern  . None  Social History Narrative   Married, 1 daughte, living.  Lives with husband in [place], [place].   1 child died brain tumor at age 64.   Two grandchildren.   Works at [employer], patient currently lost her job.   Retired 2011.   No tobacco.   Alcohol: none in 30 yrs.  Distant history of heavy use.   No drug use.    Review of Systems: General: Negative for anorexia, weight loss, fever, chills, fatigue, weakness. ENT: Negative for  hoarseness, nasal congestion. CV: Negative for chest pain, angina, palpitations, dyspnea on exertion, peripheral edema.  Respiratory: Negative for dyspnea at rest, dyspnea on exertion, cough, sputum, wheezing.  GI: See history of present illness. Endo: Negative for unusual weight change.  Heme: Negative for bruising or bleeding. Allergy: Negative for rash or hives.   Physical Exam: BP 121/77   Pulse 92   Temp 98 F (36.7 C) (Oral)   Ht 5' 4.5" (1.638 m)   BMI 30.42 kg/m  General:   Alert and oriented. Pleasant and cooperative. Well-nourished and well-developed.  Eyes:  Without icterus, sclera clear and conjunctiva pink.  Ears:  Normal auditory acuity. Cardiovascular:  S1, S2 present without murmurs appreciated. Extremities without clubbing or edema. Respiratory:  Clear to auscultation bilaterally. No wheezes, rales, or rhonchi. No distress.  Gastrointestinal:  +BS, rounded but soft, non-tender and non-distended. No HSM noted. No guarding or rebound. No masses appreciated.  Rectal:  Deferred  Musculoskalatal:  Symmetrical without gross deformities. Sitting in wheelchair. Neurologic:  Alert and oriented x4;  grossly normal neurologically. Psych:  Alert and cooperative. Normal mood and affect. Heme/Lymph/Immune: No excessive bruising noted.    03/18/2017 11:30 AM   Disclaimer: This note was dictated with voice recognition software. Similar sounding words can inadvertently be transcribed and may not be corrected upon review.

## 2017-03-18 NOTE — Progress Notes (Signed)
CC'D TO PCP °

## 2017-03-18 NOTE — Assessment & Plan Note (Signed)
The patient does have a history of IBS C.  However, she is on significant pain medications.  Her constipation is not improved with MiraLAX.  At this point I will trial her on Movantik 25 mg once daily.  Kidney function normal.  We will provide samples for 2 weeks and request a progress report for either from her or from her home administrator in 1-2 weeks.  Return for follow-up in 3 months.

## 2017-03-18 NOTE — Assessment & Plan Note (Signed)
Reflux not adequately controlled even on Prilosec twice daily.  She states Protonix worked better for her.  At this point I will have her stop Prilosec, start Protonix 40 mg once a day.  If her symptoms are not not improving we can increase to twice a day dosing.  She is requesting a letter to her group home encouraging them to feed her yogurt as this seems to help her symptoms somewhat.  Return for follow-up in 3 months.

## 2017-03-18 NOTE — Telephone Encounter (Signed)
I called [facility] and had the Movantik and Protonix cancelled.

## 2017-03-18 NOTE — Patient Instructions (Addendum)
1. Taking MiraLAX. 2. Stop taking Prilosec twice daily. 3. I have sent in a prescription for Protonix 40 mg.  Take this once a day on an empty stomach. 4. We are out of samples of Movantik 25 mg.  I have sent in a prescription for 1 month to your pharmacy to try.  Only take this while you are taking pain medicines. 5. Call in 1-2 weeks and let us know if it is helping. 6. We will schedule your procedure for you. 7. We will generate a letter to encourage yogurt consumption as this seems to help your symptoms. 8. Return for follow-up in 3 months. 9. Call if you have any questions or concerns.

## 2017-03-21 DIAGNOSIS — Z7982 Long term (current) use of aspirin: Secondary | ICD-10-CM | POA: Diagnosis not present

## 2017-03-21 DIAGNOSIS — Z794 Long term (current) use of insulin: Secondary | ICD-10-CM | POA: Diagnosis not present

## 2017-03-21 DIAGNOSIS — Z888 Allergy status to other drugs, medicaments and biological substances status: Secondary | ICD-10-CM | POA: Diagnosis not present

## 2017-03-21 DIAGNOSIS — R404 Transient alteration of awareness: Secondary | ICD-10-CM | POA: Diagnosis not present

## 2017-03-21 DIAGNOSIS — R309 Painful micturition, unspecified: Secondary | ICD-10-CM | POA: Diagnosis not present

## 2017-03-21 DIAGNOSIS — R531 Weakness: Secondary | ICD-10-CM | POA: Diagnosis not present

## 2017-03-21 DIAGNOSIS — R300 Dysuria: Secondary | ICD-10-CM | POA: Diagnosis not present

## 2017-03-21 DIAGNOSIS — I10 Essential (primary) hypertension: Secondary | ICD-10-CM | POA: Diagnosis not present

## 2017-03-21 DIAGNOSIS — Z79899 Other long term (current) drug therapy: Secondary | ICD-10-CM | POA: Diagnosis not present

## 2017-03-21 DIAGNOSIS — N390 Urinary tract infection, site not specified: Secondary | ICD-10-CM | POA: Diagnosis not present

## 2017-03-21 DIAGNOSIS — E119 Type 2 diabetes mellitus without complications: Secondary | ICD-10-CM | POA: Diagnosis not present

## 2017-03-22 ENCOUNTER — Telehealth: Payer: Self-pay | Admitting: Internal Medicine

## 2017-03-22 NOTE — Telephone Encounter (Signed)
LM with nursing staff. [name] will return call when she is available.

## 2017-03-22 NOTE — Telephone Encounter (Signed)
[phone] PLEASE CALL PATIENT, SHE WANTS TO KNOW IF SHE IS TO DISCONTINUE THE PRILOSEC AND CARAFATE    PLEASE ASK FOR KAY AT [place]

## 2017-03-25 ENCOUNTER — Telehealth: Payer: Self-pay | Admitting: *Deleted

## 2017-03-25 ENCOUNTER — Ambulatory Visit: Payer: Self-pay | Admitting: Cardiovascular Disease

## 2017-03-25 DIAGNOSIS — J42 Unspecified chronic bronchitis: Secondary | ICD-10-CM | POA: Diagnosis not present

## 2017-03-25 DIAGNOSIS — E1165 Type 2 diabetes mellitus with hyperglycemia: Secondary | ICD-10-CM | POA: Diagnosis not present

## 2017-03-25 DIAGNOSIS — S330XXS Traumatic rupture of lumbar intervertebral disc, sequela: Secondary | ICD-10-CM | POA: Diagnosis not present

## 2017-03-25 DIAGNOSIS — K5903 Drug induced constipation: Secondary | ICD-10-CM

## 2017-03-25 DIAGNOSIS — N390 Urinary tract infection, site not specified: Secondary | ICD-10-CM | POA: Diagnosis not present

## 2017-03-25 NOTE — Telephone Encounter (Signed)
Received fax from [place] [facility]. Patient Movantik 25mg  tablet is a non covered medication.

## 2017-04-01 DIAGNOSIS — M542 Cervicalgia: Secondary | ICD-10-CM | POA: Diagnosis not present

## 2017-04-01 DIAGNOSIS — M545 Low back pain: Secondary | ICD-10-CM | POA: Diagnosis not present

## 2017-04-01 DIAGNOSIS — Z6830 Body mass index (BMI) 30.0-30.9, adult: Secondary | ICD-10-CM | POA: Diagnosis not present

## 2017-04-01 DIAGNOSIS — M4120 Other idiopathic scoliosis, site unspecified: Secondary | ICD-10-CM | POA: Diagnosis not present

## 2017-04-01 DIAGNOSIS — M5416 Radiculopathy, lumbar region: Secondary | ICD-10-CM | POA: Diagnosis not present

## 2017-04-01 MED ORDER — LUBIPROSTONE 24 MCG PO CAPS: 24.0000 ug | ORAL_CAPSULE | Freq: Two times a day (BID) | ORAL | 0 refills | Status: DC

## 2017-04-01 NOTE — Telephone Encounter (Signed)
Lm with receptionist. Receptionist is going to give the message to pts nurse.

## 2017-04-01 NOTE — Telephone Encounter (Signed)
Patient resides in a nursing facility and is unable to pick up samples. Will need to send in Rx to [facility].

## 2017-04-01 NOTE — Addendum Note (Signed)
Addended by: Gordy Levan, ERIC A on: 04/01/2017 03:50 PM   Modules accepted: Orders

## 2017-04-01 NOTE — Telephone Encounter (Signed)
Rx sent to pharmacy for 30 day supply to trial. Call with results of medication.

## 2017-04-01 NOTE — Telephone Encounter (Signed)
[name] left a message and I returned her call. [name] wasn't able to speak and the receptionist will have her call back.

## 2017-04-01 NOTE — Telephone Encounter (Signed)
Ok. Let's have her pick up samples of Amitiza 24 mcg for 2 weeks to try. Call in 1-2 weeks and let us know if it's helping. Can send in Rx or change options at that point, based on patient response.

## 2017-04-01 NOTE — Telephone Encounter (Addendum)
[place] and made aware of change. I am faxing this phone note making aware of change. This needs to be faxed to [phone]. This has been faxed over to them

## 2017-04-07 NOTE — Telephone Encounter (Signed)
Closing note after several attempts to speak with pts nurse. [place] was notified of changes 03/2017 after pts appointment.

## 2017-04-08 ENCOUNTER — Telehealth: Payer: Self-pay | Admitting: Internal Medicine

## 2017-04-08 NOTE — Telephone Encounter (Signed)
Pt is aware that our office will contact her with an apt after speaking with Katie.  Will route message to KW to address on 04/09/17.

## 2017-04-08 NOTE — Telephone Encounter (Signed)
Do I have a held spot on any of those days that could be used?

## 2017-04-08 NOTE — Telephone Encounter (Signed)
[facility]-no held spots available on those days. Did you want KW to see where you can be double booked? You are double booked on most of those days already. Please advise.

## 2017-04-08 NOTE — Telephone Encounter (Signed)
Called and spoke pt. Pt states she is having asthma flares. Pt reports of chest congestion, R side chest discomfort into R rib area, prod cough with yellow mucus, nasal drainage clear in color & wheezing x2w Using ventolin once daily with some improvement.  Pt had neb treatment this morning with mild improvement.  Pt taken Robitussin once daily. Denies fever, chills, sweats or body aches.  Pt will be available for apt if needed on 01/15,01/16,01/17,02/04,02/07 or 02/21-02/25  CY please advise. Thanks  Current Outpatient Medications on File Prior to Visit  Medication Sig Dispense Refill  . acetaminophen (TYLENOL) 325 MG tablet Take 650 mg by mouth 3 (three) times daily.    Marland Kitchen albuterol (PROVENTIL HFA;VENTOLIN HFA) 108 (90 Base) MCG/ACT inhaler Inhale 1-2 puffs into the lungs every 6 (six) hours as needed for wheezing or shortness of breath.    Marland Kitchen albuterol (PROVENTIL) (2.5 MG/3ML) 0.083% nebulizer solution Take 2.5 mg by nebulization every 6 (six) hours as needed for wheezing or shortness of breath.    Marland Kitchen aspirin 81 MG chewable tablet Chew 81 mg by mouth daily.    . baclofen (LIORESAL) 10 MG tablet Take 10 mg by mouth at bedtime.    . diphenhydrAMINE (BENADRYL) 25 MG tablet Take 1 tablet (25 mg total) by mouth 2 (two) times daily as needed. 60 tablet 12  . divalproex (DEPAKOTE) 500 MG DR tablet Take 1 tablet by mouth 2 (two) times daily.    . fluticasone furoate-vilanterol (BREO ELLIPTA) 100-25 MCG/INH AEPB Inhale 1 puff into the lungs daily. 1 each 11  . gabapentin (NEURONTIN) 600 MG tablet Take 600 mg by mouth at bedtime. 300mg  every morning    . guaifenesin (ROBITUSSIN) 100 MG/5ML syrup Take 200 mg by mouth every 6 (six) hours as needed for cough.    Marland Kitchen HYDROcodone-acetaminophen (NORCO/VICODIN) 5-325 MG tablet Take 1 tablet by mouth daily. At 2:00 pm.    . insulin glargine (LANTUS) 100 UNIT/ML injection Inject 30 Units into the skin at bedtime.    Marland Kitchen ipratropium (ATROVENT) 0.03 % nasal spray 1-2  puffs each nostril 3 times daily, before meals 30 mL 11  . JANUVIA 100 MG tablet Take 1 tablet by mouth daily.    Marland Kitchen levothyroxine (SYNTHROID, LEVOTHROID) 75 MCG tablet TAKE (1) TABLET BY MOUTH ONCE DAILY BEFORE BREAKFAST. 30 tablet 5  . loperamide (IMODIUM A-D) 2 MG tablet Take 2 mg by mouth 4 (four) times daily as needed for diarrhea or loose stools.     . lubiprostone (AMITIZA) 24 MCG capsule Take 1 capsule (24 mcg total) by mouth 2 (two) times daily with a meal. 60 capsule 0  . magnesium hydroxide (MILK OF MAGNESIA) 400 MG/5ML suspension Take 30 mLs by mouth daily as needed for mild constipation.    . Menthol, Topical Analgesic, (BIOFREEZE) 4 % GEL Apply 1 application topically 2 (two) times daily. Apply to lower back and base of right thumb twice daily.    . metoprolol (LOPRESSOR) 50 MG tablet Take 1 tablet by mouth daily.    . mirabegron ER (MYRBETRIQ) 25 MG TB24 tablet Take 25 mg by mouth daily.    . Multiple Vitamins-Minerals (MULTIVITAMINS THER. W/MINERALS) TABS tablet TAKE ONE TABLET BY MOUTH ONCE DAILY. 30 each 11  . naloxegol oxalate (MOVANTIK) 25 MG TABS tablet Take 1 tablet (25 mg total) by mouth daily. 30 tablet 0  . NOVOLOG FLEXPEN 100 UNIT/ML FlexPen Inject 12 Units into the skin 3 (three) times daily with meals. Sliding scale    .  Omega 3 1000 MG CAPS TAKE (1) CAPSULE BY MOUTH ONCE DAILY. 30 capsule 11  . OXYCONTIN 10 MG 12 hr tablet Take 10 mg by mouth every 12 (twelve) hours.     . pantoprazole (PROTONIX) 40 MG tablet Take 1 tablet (40 mg total) by mouth daily. 90 tablet 3  . polyethylene glycol (MIRALAX / GLYCOLAX) packet Take 17 g by mouth daily.     . potassium chloride SA (K-DUR,KLOR-CON) 20 MEQ tablet Take 1 tablet by mouth daily.    . primidone (MYSOLINE) 50 MG tablet Take 2 tablets (100 mg total) by mouth 2 (two) times daily. (Patient taking differently: Take 150 mg by mouth 3 (three) times daily. ) 360 tablet 1  . QUEtiapine (SEROQUEL) 100 MG tablet Take 1 tablet by mouth  at bedtime.    . sodium chloride (OCEAN) 0.65 % SOLN nasal spray Place 2 sprays into both nostrils 3 (three) times daily.    . sodium chloride 1 g tablet Take 1 g by mouth 2 (two) times daily.    . sucralfate (CARAFATE) 1 g tablet Take 1 g by mouth 2 (two) times daily.     Marland Kitchen triamterene-hydrochlorothiazide (MAXZIDE-25) 37.5-25 MG tablet Take 1 tablet by mouth daily.     No current facility-administered medications on file prior to visit.     Allergies  Allergen Reactions  . Invokana [Canagliflozin] Other (See Comments)    Yeast infectios  . Losartan     Angioedema   . Metformin And Related Diarrhea  . Latex Other (See Comments)    Other Reaction: redness, blisters  . Oxycodone-Acetaminophen Itching    TYLOX. Caused internal itching per patient

## 2017-04-08 NOTE — Telephone Encounter (Signed)
[name] expects to be back tomorrow. We can let her take a look at it.

## 2017-04-12 ENCOUNTER — Telehealth: Payer: Self-pay | Admitting: Internal Medicine

## 2017-04-12 NOTE — Telephone Encounter (Signed)
The patient asked that we call her back today with appointment date time and can speak with Jeneen Rinks with this information at [phone].  I advised her I mistakenly made the appt on 01/11 (not seeing the specific dates in below message) and apologized to her numerous times.

## 2017-04-12 NOTE — Telephone Encounter (Signed)
Pt returning call about apt. Cb is [phone].

## 2017-04-12 NOTE — Telephone Encounter (Signed)
Patient returned call and appointment has been scheduled for 04/16/2017 at 11:00 am.  Patient states this appointment will work for her and she has transportation.  No call back is needed and may close message.

## 2017-04-12 NOTE — Telephone Encounter (Signed)
Spoke with [name]. Advised him that CY and Joellen Jersey are aware and we will call with an appt. He verbalized understanding. Nothing else needed at time of call.

## 2017-04-12 NOTE — Telephone Encounter (Signed)
Pt can be seen on 2-14 at 11:30am or 2-22 at 11:30am. Thanks.

## 2017-04-12 NOTE — Telephone Encounter (Signed)
Per Katie verbally- okay to double book.  lmtcb x1 for pt

## 2017-04-12 NOTE — Telephone Encounter (Signed)
Left message for pt to call us back so we can get her set up for an appt with Dr. Annamaria Boots on 05/20/17 at 11:30 or on 05/28/17 at 11:30. Will await for pt's call back.

## 2017-04-13 ENCOUNTER — Ambulatory Visit: Payer: Self-pay | Admitting: Internal Medicine

## 2017-04-13 NOTE — Telephone Encounter (Signed)
Pt is calling back that she needs an appt today [phone]

## 2017-04-13 NOTE — Telephone Encounter (Signed)
Contacted patient at [facility] told by Jackie(rep at house) that patient just sat down to eat dinner and would let her know I called and would call her back tomorrow to schedule OV.

## 2017-04-13 NOTE — Telephone Encounter (Signed)
Message will be routed to [facility].

## 2017-04-15 ENCOUNTER — Ambulatory Visit: Payer: Self-pay | Admitting: Neurology

## 2017-04-16 ENCOUNTER — Telehealth: Payer: Self-pay | Admitting: Internal Medicine

## 2017-04-16 ENCOUNTER — Telehealth: Payer: Self-pay | Admitting: Neurology

## 2017-04-16 ENCOUNTER — Telehealth: Payer: Self-pay | Admitting: *Deleted

## 2017-04-16 ENCOUNTER — Ambulatory Visit: Payer: Self-pay | Admitting: Internal Medicine

## 2017-04-16 NOTE — Telephone Encounter (Signed)
Received call from [place], [place] who is doing an investigation on patient. She states patient filed a complaint against [facility] for not taking her to her specialty appts. I answered questions, but she states she has to speak directly to provider. [phone].

## 2017-04-16 NOTE — Telephone Encounter (Signed)
Spoke with [name] and she is from [facility] and was concerned about pt missing her appts. I advised her she has an appt on 05/28/17. Nothing further is needed.

## 2017-04-16 NOTE — Telephone Encounter (Signed)
I need a release

## 2017-04-16 NOTE — Telephone Encounter (Signed)
So long as she takes her meds for tachycardia, she should be stable with respect to palpitations.

## 2017-04-16 NOTE — Telephone Encounter (Signed)
LM on private VM as requested

## 2017-04-16 NOTE — Telephone Encounter (Signed)
Called and advised Deidre Ala that Dr Tat will need a signed release form from the Pt prior to discussing any information. Deidre Ala stated she will have that sent from her main office.

## 2017-04-16 NOTE — Telephone Encounter (Signed)
[name] from [facility] care services for [facility] called regarding [name] not taking pt to f/u appts with Dr Bronson Ing for post hospital f/u. Has had to cx last 3 appt for transportation issues and has lodged a complaint to adult care services that facility is not providing transportation to her necessary provider f/u appts. Wanted provider input on potential cardiac problems that could occur with pt continuing to miss f/u appt.

## 2017-04-19 NOTE — Telephone Encounter (Signed)
[facility] and spoke with receptionist. She states she was unable to speak to anyone to regarding to pt. She states she will have someone return our call. Will await return call.

## 2017-04-19 NOTE — Telephone Encounter (Signed)
Please attempt to contact patient today and schedule OV with CY.(okay to double book) Thanks.

## 2017-04-20 ENCOUNTER — Telehealth: Payer: Self-pay

## 2017-04-20 NOTE — Telephone Encounter (Signed)
Received a call from a nurse of the state of [place] that cares for the Residents at the pts facility. The nurse asked if the pt missed an appointment since her last December appointment. Pt is scheduled for a 3 month recheck and is up to date to her with her appointments. The nurse also asked if our office received a call about pts movantik prescription. Pt was given samples and a call was received on 03/18/17. See 03/18/17 note.

## 2017-04-21 NOTE — Telephone Encounter (Signed)
Attempted to call pt but unable to speak to pt because their phone lines is down and they can only send a message back to her. I lm to call us back to make appt with CY.

## 2017-04-22 NOTE — Telephone Encounter (Signed)
Attempted to contact pt. I was told by the person who answered the phone that the pt was not available at this time. Will try back.

## 2017-04-23 NOTE — Telephone Encounter (Signed)
atc pt through [place], was advised that pt is unavailable at this time.  Asked for her to have patient return our multiple phone calls.  Will close encounter per triage protocol.

## 2017-04-26 ENCOUNTER — Emergency Department (HOSPITAL_COMMUNITY)
Admission: EM | Admit: 2017-04-26 | Discharge: 2017-04-26 | Disposition: A | Discharge status: Home / Self Care | Payer: Medicare Other | Attending: Emergency Medicine | Admitting: Emergency Medicine

## 2017-04-26 ENCOUNTER — Encounter (HOSPITAL_COMMUNITY): Payer: Self-pay | Admitting: Emergency Medicine

## 2017-04-26 ENCOUNTER — Other Ambulatory Visit: Payer: Self-pay

## 2017-04-26 DIAGNOSIS — Z79899 Other long term (current) drug therapy: Secondary | ICD-10-CM | POA: Insufficient documentation

## 2017-04-26 DIAGNOSIS — Z91040 Latex allergy status: Secondary | ICD-10-CM | POA: Diagnosis not present

## 2017-04-26 DIAGNOSIS — Z794 Long term (current) use of insulin: Secondary | ICD-10-CM | POA: Diagnosis not present

## 2017-04-26 DIAGNOSIS — I10 Essential (primary) hypertension: Secondary | ICD-10-CM | POA: Diagnosis not present

## 2017-04-26 DIAGNOSIS — J45909 Unspecified asthma, uncomplicated: Secondary | ICD-10-CM | POA: Insufficient documentation

## 2017-04-26 DIAGNOSIS — E119 Type 2 diabetes mellitus without complications: Secondary | ICD-10-CM | POA: Diagnosis not present

## 2017-04-26 DIAGNOSIS — Z87891 Personal history of nicotine dependence: Secondary | ICD-10-CM | POA: Insufficient documentation

## 2017-04-26 DIAGNOSIS — Z7982 Long term (current) use of aspirin: Secondary | ICD-10-CM | POA: Diagnosis not present

## 2017-04-26 DIAGNOSIS — K0889 Other specified disorders of teeth and supporting structures: Secondary | ICD-10-CM | POA: Diagnosis not present

## 2017-04-26 MED ORDER — AMOXICILLIN 500 MG PO CAPS: 500.0000 mg | ORAL_CAPSULE | Freq: Three times a day (TID) | ORAL | 0 refills | Status: DC

## 2017-04-26 NOTE — ED Triage Notes (Signed)
Pt c/o right sided dental pain for a while. Pt states she was given hydrocodone x 28min ago.

## 2017-04-26 NOTE — Discharge Instructions (Signed)
Schedule to see the dentist for evaluation

## 2017-04-26 NOTE — ED Notes (Signed)
Pt transported by EMS back to [facility].

## 2017-04-26 NOTE — ED Notes (Signed)
Facility called advised of pt disposition. Was advised they had no one to come pick pt up & would need EMS to return to facility.

## 2017-04-26 NOTE — ED Provider Notes (Signed)
[facility] EMERGENCY DEPARTMENT Provider Note   CSN: [id number] Arrival date & time: 04/26/17  2136     History   Chief Complaint Chief Complaint  Patient presents with  . Dental Pain    HPI [name] is a 67 y.o. female.  The history is provided by the patient. No language interpreter was used.  Dental Pain   This is a new problem. Episode onset: 2 weeks. The problem occurs constantly. The problem has been gradually worsening. The pain is moderate. She has tried acetaminophen for the symptoms. The treatment provided mild relief.   Pt has a tooth that is causing her pain.  Pt reports painful tooth in right upper mouth.   Past Medical History:  Diagnosis Date  . Allergic rhinitis    uses Nasonex daily as needed  . Allergy to angiotensin receptor blockers (ARB) 01/22/2014   Angioedema  . Anxiety    takes Ativan daily as needed  . Aspiration pneumonia ([place])   . Asthma    Albuterol inhaler and Neb daily as needed  . 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 [name], residual palpitations  . Chronic back pain    reason unknown  . Constipation   . DDD (degenerative disc disease), lumbar   . Depression    takes Zoloft daily as well as Cymbalta  . Diabetes mellitus    takes NOvolin daily  . Dysphagia   . Dysrhythmia    SVT ==ablation done by Dr. Rayann Heman 2007  . Esophageal reflux    takes Zantac daily  . History of bronchitis Dec 2014  . Hypertension    takes Losartan daily  . Hypothyroidism    takes Synthroid daily  . Joint pain   . Joint swelling    left ankle  . Low sodium syndrome   . Lumbar radiculopathy   . Seizures ([place]) 04-05-13   one time ran out of Primidone and had stopped it cold [place]  . Tremor, essential    takes Mysoline and Neurontin daily.  . Vocal cord dysfunction   . Weakness    in left arm    Patient Active Problem List   Diagnosis Date Noted  . Constipation 03/18/2017  . CAP  (community acquired pneumonia) 04/21/2016  . History of colonic polyps   . Diverticulosis of colon without hemorrhage   . Other hemorrhoids   . [name]'s ring   . Mucosal abnormality of stomach   . Encounter for screening colonoscopy 02/21/2015  . Upper airway cough syndrome 01/18/2015  . OAB (overactive bladder) 12/30/2014  . Incontinence in female 12/25/2014  . 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 Thrush of mouth and esophagus ([place]) 11/23/2014  . Depression with somatization 11/05/2014  . Type II diabetes mellitus with manifestations ([place]) 10/23/2014  . Visit for screening mammogram 08/21/2014  . Hypokalemia 05/30/2014  . Tinea corporis 05/30/2014  . Lumbar scoliosis 04/24/2014  . Obstructive sleep apnea 04/23/2014  . Allergy to angiotensin receptor blockers (ARB) 01/22/2014  . S/P deep brain stimulator placement 01/10/2014  . Essential tremor 12/04/2013  . Greater trochanteric bursitis of left hip 11/07/2013  . Spinal stenosis of lumbar region 10/30/2013  . Right thyroid nodule 10/12/2013  . CMC arthritis, thumb, degenerative 06/15/2013  . IBS (irritable bowel syndrome) 11/03/2012  . Esophageal stenosis 11/01/2012  . Asthma, moderate persistent, poorly-controlled 09/01/2012  . Hypothyroidism 12/22/2011  . Hyperlipidemia with target LDL less  than 100 06/22/2008  . Essential hypertension 09/04/2007  . TREMOR, ESSENTIAL 04/19/2007  . Seasonal and perennial allergic rhinitis 04/19/2007  . Esophageal reflux 04/19/2007    Past Surgical History:  Procedure Laterality Date  . ABLATION FOR AVNRT    . BARTHOLIN GLAND CYST EXCISION     x 2  . BIOPSY  03/04/2015   Procedure: BIOPSY (Duodenal, Gastric);  Surgeon: Daneil Dolin, MD;  Location: [facility];  Service: Endoscopy;;  . CESAREAN SECTION    . CHOLECYSTECTOMY    . COLONOSCOPY  01/16/2004   ZOX:WRUEAV rectum/colon  . COLONOSCOPY WITH PROPOFOL N/A 03/04/2015   Dr.[name]-  internal hemorrhoids, pancolonic diverticulosis, colonic polyp= tubular adenoma  . ESOPHAGEAL DILATION N/A 03/04/2015   Procedure: ESOPHAGEAL DILATION WITH 54FR MALONEY DILATOR;  Surgeon: Daneil Dolin, MD;  Location: [facility];  Service: Endoscopy;  Laterality: N/A;  . ESOPHAGOGASTRODUODENOSCOPY (EGD) WITH ESOPHAGEAL DILATION  04/03/2002   WUJ:[id number]'G ring, otherwise normal esophagus, status post dilation with 56 F/Normal stomach  . ESOPHAGOGASTRODUODENOSCOPY (EGD) WITH ESOPHAGEAL DILATION N/A 11/08/2012   Dr. [name]:schatzki's ring s/p dilation/hiatal hernia  . ESOPHAGOGASTRODUODENOSCOPY (EGD) WITH PROPOFOL N/A 03/04/2015   Dr.[name]- schatzki's ring, hiatal hernia, scattered erosions bx= chronic inactive gastritis  . HEMORRHOID BANDING  2017   Dr.[name]  . LEFT ANKLE LIGAMENT REPAIR     x 2  . LEFT ELBOW REPAIR    . MAXIMUM ACCESS (MAS)POSTERIOR LUMBAR INTERBODY FUSION (PLIF) 1 LEVEL N/A 04/24/2014   Procedure: Lumbar four-five Maximum access Surgery posterior lumbar interbody fusion;  Surgeon: [name], MD;  Location: [place] NEURO ORS;  Service: Neurosurgery;  Laterality: N/A;  Lumbar four-five Maximum access Surgery posterior lumbar interbody fusion  . PARTIAL HYSTERECTOMY    . POLYPECTOMY  03/04/2015   Procedure: POLYPECTOMY (descending colon);  Surgeon: Daneil Dolin, MD;  Location: [facility];  Service: Endoscopy;;  . PULSE GENERATOR IMPLANT Bilateral 12/15/2013   Procedure: BILATERAL PULSE GENERATOR IMPLANT;  Surgeon: [name], MD;  Location: [place] NEURO ORS;  Service: Neurosurgery;  Laterality: Bilateral;  BILATERAL  . RIGHT BREAST CYST     benign  . SUBTHALAMIC STIMULATOR INSERTION Bilateral 12/04/2013   Procedure: Bilateral Deep brain stimulator placement;  Surgeon: [name], MD;  Location: [place] NEURO ORS;  Service: Neurosurgery;  Laterality: Bilateral;  Bilateral Deep brain stimulator placement  . TONSILLECTOMY      OB History    Gravida Para Term Preterm AB Living   2 2 2     1      SAB TAB Ectopic Multiple Live Births                   Home Medications    Prior to Admission medications   Medication Sig Start Date End Date Taking? Authorizing Provider  acetaminophen (TYLENOL) 325 MG tablet Take 650 mg by mouth 3 (three) times daily.   Yes [provider]  aspirin 81 MG chewable tablet Chew 81 mg by mouth daily.   Yes [provider]  cyclobenzaprine (AMRIX) 15 MG 24 hr capsule Take 15 mg by mouth at bedtime.   Yes [provider]  divalproex (DEPAKOTE) 500 MG DR tablet Take 1 tablet by mouth 2 (two) times daily. 11/24/16 04/26/17 Yes [provider]  fluticasone furoate-vilanterol (BREO ELLIPTA) 100-25 MCG/INH AEPB Inhale 1 puff into the lungs daily. 12/17/16  Yes Young, Tarri Fuller D, MD  gabapentin (NEURONTIN) 300 MG capsule Take 300-600 mg by mouth See admin instructions. Take 300 mg in the morning and  600 mg at bedtime   Yes [provider]  guaifenesin (ROBITUSSIN) 100 MG/5ML syrup Take 200 mg by mouth every 6 (six) hours as needed for cough.   Yes [provider]  HYDROcodone-acetaminophen (NORCO/VICODIN) 5-325 MG tablet Take 1 tablet by mouth daily. At 2:00 pm. 10/01/16  Yes [provider]  insulin glargine (LANTUS) 100 UNIT/ML injection Inject 30 Units into the skin at bedtime.   Yes [provider]  ipratropium (ATROVENT) 0.03 % nasal spray 1-2 puffs each nostril 3 times daily, before meals Patient taking differently: Place 1-2 sprays into both nostrils 3 (three) times daily.  01/12/17  Yes Young, Tarri Fuller D, MD  JANUVIA 100 MG tablet Take 100 mg by mouth daily.  11/26/16  Yes [provider]  levothyroxine (SYNTHROID, LEVOTHROID) 75 MCG tablet TAKE (1) TABLET BY MOUTH ONCE DAILY BEFORE BREAKFAST. 01/12/15  Yes Janith Lima, MD  Menthol, Topical Analgesic, (BIOFREEZE) 4 % GEL Apply 1 application topically 2 (two) times daily. Apply to lower back and base of right thumb twice daily.   Yes  [provider]  metoprolol succinate (TOPROL-XL) 50 MG 24 hr tablet Take 50 mg by mouth daily. Take with or immediately following a meal.   Yes [provider]  mirabegron ER (MYRBETRIQ) 25 MG TB24 tablet Take 25 mg by mouth daily.   Yes [provider]  Multiple Vitamins-Minerals (MULTIVITAMINS THER. W/MINERALS) TABS tablet TAKE ONE TABLET BY MOUTH ONCE DAILY. 04/16/15  Yes Janith Lima, MD  NOVOLOG FLEXPEN 100 UNIT/ML FlexPen Inject 12 Units into the skin 3 (three) times daily after meals.  07/01/15  Yes [provider]  Omega 3 1000 MG CAPS TAKE (1) CAPSULE BY MOUTH ONCE DAILY. 04/16/15  Yes Janith Lima, MD  omeprazole (PRILOSEC) 20 MG capsule Take 20 mg by mouth 2 (two) times daily before a meal.   Yes [provider]  Oxycodone HCl 10 MG TABS Take 10 mg by mouth 2 (two) times daily.   Yes [provider]  polyethylene glycol (MIRALAX / GLYCOLAX) packet Take 17 g by mouth daily. May take an additional 17g as needed for constipation 11/26/16  Yes [provider]  potassium chloride SA (K-DUR,KLOR-CON) 20 MEQ tablet Take 20 mEq by mouth daily.  11/26/16  Yes [provider]  primidone (MYSOLINE) 50 MG tablet Take 2 tablets (100 mg total) by mouth 2 (two) times daily. Patient taking differently: Take 150 mg by mouth 3 (three) times daily.  03/06/15  Yes Tat, Eustace Quail, DO  QUEtiapine (SEROQUEL) 100 MG tablet Take 100 mg by mouth at bedtime.  11/25/16  Yes [provider]  sodium chloride 1 g tablet Take 1 g by mouth 2 (two) times daily.   Yes [provider]  sucralfate (CARAFATE) 1 g tablet Take 1 g by mouth 2 (two) times daily.  04/24/16  Yes [provider]  triamterene-hydrochlorothiazide (MAXZIDE-25) 37.5-25 MG tablet Take 1 tablet by mouth daily. 11/26/16  Yes [provider]  albuterol (PROVENTIL) (2.5 MG/3ML) 0.083% nebulizer solution Take 2.5 mg by nebulization every 6 (six) hours as  needed for wheezing or shortness of breath.    [provider]  diphenhydrAMINE (BENADRYL) 25 MG tablet Take 1 tablet (25 mg total) by mouth 2 (two) times daily as needed. Patient taking differently: Take 25 mg by mouth 2 (two) times daily as needed for itching or allergies.  03/04/17   Deneise Lever, MD  loperamide (IMODIUM A-D) 2  MG tablet Take 2 mg by mouth 4 (four) times daily as needed for diarrhea or loose stools.     [provider]  lubiprostone (AMITIZA) 24 MCG capsule Take 1 capsule (24 mcg total) by mouth 2 (two) times daily with a meal. Patient not taking: Reported on 04/26/2017 04/01/17   Carlis Stable, NP  magnesium hydroxide (MILK OF MAGNESIA) 400 MG/5ML suspension Take 30 mLs by mouth at bedtime as needed for mild constipation.     [provider]  naloxegol oxalate (MOVANTIK) 25 MG TABS tablet Take 1 tablet (25 mg total) by mouth daily. Patient not taking: Reported on 04/26/2017 03/18/17   Carlis Stable, NP    Family History Family History  Problem Relation Age of Onset  . Lung cancer Father        DIED AGE 66 LUNG CA  . Alcohol abuse Father   . Anxiety disorder Father   . Depression Father   . COPD Mother        DIED AGE 28,MRSA,COPD,PNEUMONIA  . Pneumonia Mother        DIED AGE 28,MRSA,COPD,PNEUMONIA  . Supraventricular tachycardia Mother   . Anxiety disorder Mother   . Depression Mother   . Alcohol abuse Mother   . Heart disease Sister        DIED AGE 105, SMOKER,?HEART  . COPD Brother        AGE 96  . Colon cancer Neg Hx     Social History Social History   Tobacco Use  . Smoking status: Former Smoker    Packs/day: 0.30    Years: 10.00    Pack years: 3.00    Types: Cigarettes    Last attempt to quit: 04/23/1993    Years since quitting: 24.0  . Smokeless tobacco: Never Used  . Tobacco comment: quit smoking 25+yrs ago  Substance Use Topics  . Alcohol use: No    Alcohol/week: 0.0 oz  . Drug use: No     Allergies     Invokana [canagliflozin]; Losartan; Metformin and related; Latex; Other; Oxycodone-acetaminophen; and Mucinex [guaifenesin er]   Review of Systems Review of Systems  All other systems reviewed and are negative.    Physical Exam Updated Vital Signs BP (!) 127/92 (BP Location: Right Arm)   Pulse 80   Temp 98.4 F (36.9 C) (Oral)   Resp 16   Ht 5' 4.5" (1.638 m)   Wt 83 kg (183 lb)   SpO2 98%   BMI 30.93 kg/m   Physical Exam  Constitutional: She is oriented to person, place, and time. She appears well-developed and well-nourished.  HENT:  Head: Normocephalic.  Right Ear: External ear normal.  Left Ear: External ear normal.  Nose: Nose normal.  Mouth/Throat: Oropharynx is clear and moist.  Large filling upper right 2nd molar,  No obvious abscess  Cardiovascular: Normal rate.  Pulmonary/Chest: Effort normal.  Neurological: She is alert and oriented to person, place, and time.  Skin: Skin is warm.  Psychiatric: She has a normal mood and affect.  Nursing note and vitals reviewed.    ED Treatments / Results  Labs (all labs ordered are listed, but only abnormal results are displayed) Labs Reviewed - No data to display  EKG  EKG Interpretation None       Radiology No results found.  Procedures Procedures (including critical care time)  Medications Ordered in ED Medications - No data to display   Initial Impression / Assessment and Plan / ED Course  I have reviewed the triage vital signs and the nursing notes.  Pertinent labs & imaging results that were available during my care of the patient were reviewed by me and considered in my medical decision making (see chart for details).     Meds ordered this encounter  Medications  . amoxicillin (AMOXIL) 500 MG capsule    Sig: Take 1 capsule (500 mg total) by mouth 3 (three) times daily.    Dispense:  30 capsule    Refill:  0    Order Specific Question:   Supervising Provider    Answer:   Noemi Chapel  [[id number]]    Final Clinical Impressions(s) / ED Diagnoses   Final diagnoses:  Pain, dental    ED Discharge Orders    None    An After Visit Summary was printed and given to the patient. Pt is on hydrocodone for pain.  Pt reports she takes every day   Sidney Ace 04/26/17 2225    Orlie Dakin, MD 04/27/17 [id number]

## 2017-04-28 NOTE — Pre-Procedure Instructions (Signed)
Patient for preop phone call for procedure to be done 05/06/2017. She is a resident at [facility], [phone]. Spoke with Katrina, caregiver, who states she does not have information from office about NPO status and diet from office. I verbally went over instructions with her and faxed information to her as well at [phone]. Copy of faxed information in chart.

## 2017-04-28 NOTE — Patient Instructions (Signed)
    [name]  04/28/2017     @PREFPERIOPPHARMACY @   Your procedure is scheduled on  05/06/2017 .  Report to [facility] at  815   A.M.  Call this number if you have problems the morning of surgery:  [phone]   Remember:  Do not eat food or drink liquids after midnight.  Take these medicines the morning of surgery with A SIP OF WATER  Depakote, neurontin, hydrocodone or oxycodone (if needed), levothyroxine, metoprolol, myrbetriq, prilosec. Use your nebulizer and your inhaler before you come.   Do not wear jewelry, make-up or nail polish.  Do not wear lotions, powders, or perfumes, or deodorant.  Do not shave 48 hours prior to surgery.  Men may shave face and neck.  Do not bring valuables to the hospital.  [facility] is not responsible for any belongings or valuables.  Contacts, dentures or bridgework may not be worn into surgery.  Leave your suitcase in the car.  After surgery it may be brought to your room.  For patients admitted to the hospital, discharge time will be determined by your treatment team.  Patients discharged the day of surgery will not be allowed to drive home.   Name and phone number of your driver:   [facility] Special instructions:  Follow the diet instructions enclosed. Take 15 untis of Lantus the night before your procedure. No Novolog, Lantus or any other medications for diabetes the morning of your procedure.  Please read over the following fact sheets that you were given. Anesthesia Post-op Instructions and Care and Recovery After Surgery

## 2017-04-29 ENCOUNTER — Encounter (HOSPITAL_COMMUNITY)
Admission: RE | Admit: 2017-04-29 | Discharge: 2017-04-29 | Disposition: A | Discharge status: Home / Self Care | Payer: Medicare Other | Source: Ambulatory Visit | Attending: Internal Medicine | Admitting: Internal Medicine

## 2017-05-03 ENCOUNTER — Ambulatory Visit: Payer: Self-pay | Admitting: Cardiovascular Disease

## 2017-05-06 ENCOUNTER — Encounter (HOSPITAL_COMMUNITY): Payer: Self-pay | Admitting: *Deleted

## 2017-05-06 ENCOUNTER — Encounter: Payer: Self-pay | Admitting: *Deleted

## 2017-05-06 ENCOUNTER — Ambulatory Visit: Payer: Self-pay | Admitting: Cardiovascular Disease

## 2017-05-06 ENCOUNTER — Ambulatory Visit (HOSPITAL_COMMUNITY): Payer: Medicare Other | Admitting: Anesthesiology

## 2017-05-06 ENCOUNTER — Ambulatory Visit (HOSPITAL_COMMUNITY)
Admission: RE | Admit: 2017-05-06 | Discharge: 2017-05-06 | Disposition: A | Discharge status: Home / Self Care | Payer: Medicare Other | Source: Ambulatory Visit | Attending: Internal Medicine | Admitting: Internal Medicine

## 2017-05-06 ENCOUNTER — Encounter (HOSPITAL_COMMUNITY)
Admission: RE | Disposition: A | Discharge status: Home / Self Care | Payer: Self-pay | Source: Ambulatory Visit | Attending: Internal Medicine

## 2017-05-06 DIAGNOSIS — J45909 Unspecified asthma, uncomplicated: Secondary | ICD-10-CM | POA: Diagnosis not present

## 2017-05-06 DIAGNOSIS — E039 Hypothyroidism, unspecified: Secondary | ICD-10-CM | POA: Insufficient documentation

## 2017-05-06 DIAGNOSIS — R1310 Dysphagia, unspecified: Secondary | ICD-10-CM | POA: Diagnosis not present

## 2017-05-06 DIAGNOSIS — Z888 Allergy status to other drugs, medicaments and biological substances status: Secondary | ICD-10-CM | POA: Insufficient documentation

## 2017-05-06 DIAGNOSIS — F419 Anxiety disorder, unspecified: Secondary | ICD-10-CM | POA: Diagnosis not present

## 2017-05-06 DIAGNOSIS — Z91040 Latex allergy status: Secondary | ICD-10-CM | POA: Insufficient documentation

## 2017-05-06 DIAGNOSIS — Z7982 Long term (current) use of aspirin: Secondary | ICD-10-CM | POA: Diagnosis not present

## 2017-05-06 DIAGNOSIS — G250 Essential tremor: Secondary | ICD-10-CM | POA: Diagnosis not present

## 2017-05-06 DIAGNOSIS — K219 Gastro-esophageal reflux disease without esophagitis: Secondary | ICD-10-CM | POA: Insufficient documentation

## 2017-05-06 DIAGNOSIS — M1990 Unspecified osteoarthritis, unspecified site: Secondary | ICD-10-CM | POA: Diagnosis not present

## 2017-05-06 DIAGNOSIS — Z885 Allergy status to narcotic agent status: Secondary | ICD-10-CM | POA: Diagnosis not present

## 2017-05-06 DIAGNOSIS — I10 Essential (primary) hypertension: Secondary | ICD-10-CM | POA: Insufficient documentation

## 2017-05-06 DIAGNOSIS — E119 Type 2 diabetes mellitus without complications: Secondary | ICD-10-CM | POA: Diagnosis not present

## 2017-05-06 DIAGNOSIS — Z6830 Body mass index (BMI) 30.0-30.9, adult: Secondary | ICD-10-CM | POA: Insufficient documentation

## 2017-05-06 DIAGNOSIS — F329 Major depressive disorder, single episode, unspecified: Secondary | ICD-10-CM | POA: Insufficient documentation

## 2017-05-06 DIAGNOSIS — M5116 Intervertebral disc disorders with radiculopathy, lumbar region: Secondary | ICD-10-CM | POA: Insufficient documentation

## 2017-05-06 DIAGNOSIS — K449 Diaphragmatic hernia without obstruction or gangrene: Secondary | ICD-10-CM | POA: Insufficient documentation

## 2017-05-06 DIAGNOSIS — Z79899 Other long term (current) drug therapy: Secondary | ICD-10-CM | POA: Insufficient documentation

## 2017-05-06 DIAGNOSIS — Z7984 Long term (current) use of oral hypoglycemic drugs: Secondary | ICD-10-CM | POA: Insufficient documentation

## 2017-05-06 DIAGNOSIS — G4730 Sleep apnea, unspecified: Secondary | ICD-10-CM | POA: Insufficient documentation

## 2017-05-06 DIAGNOSIS — Z87891 Personal history of nicotine dependence: Secondary | ICD-10-CM | POA: Diagnosis not present

## 2017-05-06 HISTORY — PX: MALONEY DILATION: SHX5535

## 2017-05-06 HISTORY — PX: ESOPHAGOGASTRODUODENOSCOPY (EGD) WITH PROPOFOL: SHX5813

## 2017-05-06 LAB — GLUCOSE, CAPILLARY
Glucose-Capillary: 132 mg/dL — ABNORMAL HIGH (ref 65–99)
Glucose-Capillary: 146 mg/dL — ABNORMAL HIGH (ref 65–99)

## 2017-05-06 SURGERY — ESOPHAGOGASTRODUODENOSCOPY (EGD) WITH PROPOFOL
Anesthesia: Monitor Anesthesia Care

## 2017-05-06 MED ORDER — FENTANYL CITRATE (PF) 100 MCG/2ML IJ SOLN
INTRAMUSCULAR | Status: AC
  Filled 2017-05-06: qty 2

## 2017-05-06 MED ORDER — CHLORHEXIDINE GLUCONATE CLOTH 2 % EX PADS: 6.0000 | MEDICATED_PAD | Freq: Once | CUTANEOUS | Status: DC

## 2017-05-06 MED ORDER — PROPOFOL 500 MG/50ML IV EMUL
INTRAVENOUS | Status: DC | PRN
  Administered 2017-05-06: 100 ug/kg/min via INTRAVENOUS

## 2017-05-06 MED ORDER — MIDAZOLAM HCL 2 MG/2ML IJ SOLN
1.0000 mg | INTRAMUSCULAR | Status: AC
  Administered 2017-05-06: 2 mg via INTRAVENOUS

## 2017-05-06 MED ORDER — MIDAZOLAM HCL 2 MG/2ML IJ SOLN
INTRAMUSCULAR | Status: AC
  Filled 2017-05-06: qty 2

## 2017-05-06 MED ORDER — LIDOCAINE VISCOUS 2 % MT SOLN
15.0000 mL | Freq: Once | OROMUCOSAL | Status: AC
  Administered 2017-05-06: 6 mL via OROMUCOSAL

## 2017-05-06 MED ORDER — PROPOFOL 10 MG/ML IV BOLUS
INTRAVENOUS | Status: AC
  Filled 2017-05-06: qty 20

## 2017-05-06 MED ORDER — FENTANYL CITRATE (PF) 100 MCG/2ML IJ SOLN
25.0000 ug | Freq: Once | INTRAMUSCULAR | Status: AC
  Administered 2017-05-06: 25 ug via INTRAVENOUS

## 2017-05-06 MED ORDER — LIDOCAINE VISCOUS 2 % MT SOLN
OROMUCOSAL | Status: AC
  Filled 2017-05-06: qty 15

## 2017-05-06 MED ORDER — MIDAZOLAM HCL 5 MG/5ML IJ SOLN
INTRAMUSCULAR | Status: DC | PRN
  Administered 2017-05-06: 2 mg via INTRAVENOUS

## 2017-05-06 MED ORDER — LACTATED RINGERS IV SOLN
INTRAVENOUS | Status: DC
  Administered 2017-05-06: 09:00:00 via INTRAVENOUS

## 2017-05-06 NOTE — Transfer of Care (Signed)
Immediate Anesthesia Transfer of Care Note  Patient: [name]  Procedure(s) Performed: ESOPHAGOGASTRODUODENOSCOPY (EGD) WITH PROPOFOL (N/A ) MALONEY DILATION (N/A )  Patient Location: PACU  Anesthesia Type:MAC  Level of Consciousness: awake and patient cooperative  Airway & Oxygen Therapy: Patient Spontanous Breathing and Patient connected to face mask oxygen  Post-op Assessment: Report given to RN, Post -op Vital signs reviewed and stable and Patient moving all extremities  Post vital signs: Reviewed and stable  Last Vitals:  Vitals:   05/06/17 0945 05/06/17 0950  BP:    Pulse:    Resp: (!) 24 (!) 24  Temp:    SpO2:      Last Pain:  Vitals:   05/06/17 1000  TempSrc:   PainSc: 0-No pain      Patients Stated Pain Goal: 4 (47/09/62 8366)  Complications: No apparent anesthesia complications

## 2017-05-06 NOTE — Discharge Instructions (Signed)
Omeprazole tablets (OTC) What is this medicine? OMEPRAZOLE (oh ME pray zol) prevents the production of acid in the stomach. It is used to treat the symptoms of heartburn. You can buy this medicine without a prescription. This product is not for long-term use, unless otherwise directed by your doctor or health care professional. This medicine may be used for other purposes; ask your health care provider or pharmacist if you have questions. COMMON BRAND NAME(S): Prilosec OTC What should I tell my health care provider before I take this medicine? They need to know if you have any of these conditions: -black or bloody stools -chest pain -difficulty swallowing -have had heartburn for over 3 months -have heartburn with dizziness, lightheadedness or sweating -liver disease -lupus -stomach pain -unexplained weight loss -vomiting with blood -wheezing -an unusual or allergic reaction to omeprazole, other medicines, foods, dyes, or preservatives -pregnant or trying to get pregnant -breast-feeding How should I use this medicine? Take this medicine by mouth. Follow the directions on the product label. If you are taking this medicine without a prescription, take one tablet every day. Do not use for longer than 14 days or repeat a course of treatment more often than every 4 months unless directed by a doctor or healthcare professional. Take your dose at regular intervals every 24 hours. Swallow the tablet whole with a drink of water. Do not crush, break or chew. This medicine works best if taken on an empty stomach 30 minutes before breakfast. If you are using this medicine with the prescription of your doctor or healthcare professional, follow the directions you were given. Do not take your medicine more often than directed. Talk to your pediatrician regarding the use of this medicine in children. Special care may be needed. Overdosage: If you think you have taken too much of this medicine contact a poison  control center or emergency room at once. NOTE: This medicine is only for you. Do not share this medicine with others. What if I miss a dose? If you miss a dose, take it as soon as you can. If it is almost time for your next dose, take only that dose. Do not take double or extra doses. What may interact with this medicine? Do not take this medicine with any of the following medications: -atazanavir -clopidogrel -nelfinavir This medicine may also interact with the following medications: -ampicillin -certain medicines for anxiety or sleep -certain medicines that treat or prevent blood clots like warfarin -cyclosporine -diazepam -digoxin -disulfiram -iron salts -methotrexate -mycophenolate mofetil -phenytoin -prescription medicine for fungal or yeast infection like itraconazole, ketoconazole, voriconazole -saquinavir -tacrolimus This list may not describe all possible interactions. Give your health care provider a list of all the medicines, herbs, non-prescription drugs, or dietary supplements you use. Also tell them if you smoke, drink alcohol, or use illegal drugs. Some items may interact with your medicine. What should I watch for while using this medicine? It can take several days before your heartburn gets better. Check with your doctor or health care professional if your condition does not start to get better, or if it gets worse. Do not treat diarrhea with over the counter products. Contact your doctor if you have diarrhea that lasts more than 2 days or if it is severe and watery. Do not treat yourself for heartburn with this medicine for more than 14 days in a row. You should only use this medicine for a 2-week treatment period once every 4 months. If your symptoms return shortly after your  therapy is complete, or within the 4 month time frame, call your doctor or health care professional. What side effects may I notice from receiving this medicine? Side effects that you should  report to your doctor or health care professional as soon as possible: -allergic reactions like skin rash, itching or hives, swelling of the face, lips, or tongue -bone, muscle or joint pain -breathing problems -chest pain or chest tightness -dark yellow or brown urine -diarrhea -dizziness -fast, irregular heartbeat -feeling faint or lightheaded -fever or sore throat -muscle spasm -palpitations -rash on cheeks or arms that gets worse in the sun -redness, blistering, peeling or loosening of the skin, including inside the mouth -seizures -tremors -unusual bleeding or bruising -unusually weak or tired -yellowing of the eyes or skin Side effects that usually do not require medical attention (report to your doctor or health care professional if they continue or are bothersome): -constipation -dry mouth -headache -loose stools -nausea This list may not describe all possible side effects. Call your doctor for medical advice about side effects. You may report side effects to FDA at 1-800-FDA-1088. Where should I keep my medicine? Keep out of the reach of children. Store at room temperature between 20 and 25 degrees C (68 and 77 degrees F). Protect from light and moisture. Throw away any unused medicine after the expiration date. NOTE: This sheet is a summary. It may not cover all possible information. If you have questions about this medicine, talk to your doctor, pharmacist, or health care provider.  2018 [employer]vier/Gold Standard (2015-04-25 13:06:31) EGD Discharge instructions Please read the instructions outlined below and refer to this sheet in the next few weeks. These discharge instructions provide you with general information on caring for yourself after you leave the hospital. Your doctor may also give you specific instructions. While your treatment has been planned according to the most current medical practices available, unavoidable complications occasionally occur. If you have  any problems or questions after discharge, please call your doctor. ACTIVITY  You may resume your regular activity but move at a slower pace for the next 24 hours.   Take frequent rest periods for the next 24 hours.   Walking will help expel (get rid of) the air and reduce the bloated feeling in your abdomen.   No driving for 24 hours (because of the anesthesia (medicine) used during the test).   You may shower.   Do not sign any important legal documents or operate any machinery for 24 hours (because of the anesthesia used during the test).  NUTRITION  Drink plenty of fluids.   You may resume your normal diet.   Begin with a light meal and progress to your normal diet.   Avoid alcoholic beverages for 24 hours or as instructed by your caregiver.  MEDICATIONS  You may resume your normal medications unless your caregiver tells you otherwise.  WHAT YOU CAN EXPECT TODAY  You may experience abdominal discomfort such as a feeling of fullness or gas pains.  FOLLOW-UP  Your doctor will discuss the results of your test with you.  SEEK IMMEDIATE MEDICAL ATTENTION IF ANY OF THE FOLLOWING OCCUR:  Excessive nausea (feeling sick to your stomach) and/or vomiting.   Severe abdominal pain and distention (swelling).   Trouble swallowing.   Temperature over 101 F (37.8 C).   Rectal bleeding or vomiting of blood.    Continue omeprazole twice daily.  Return to the office in 3 months.

## 2017-05-06 NOTE — Op Note (Signed)
[facility] Patient Name: [name] Procedure Date: 05/06/2017 9:17 AM MRN: [id number] Date of Birth: [birth year] Attending MD: Norvel Richards , MD CSN: [id number] Age: 68 Admit Type: Outpatient Procedure:                Upper GI endoscopy Indications:              Dysphagia Providers:                Norvel Richards, MD, Jeanann Lewandowsky. Sharon Seller, RN,                            Aram Candela Referring MD:             Abran Richard Medicines:                Propofol per Anesthesia Complications:            No immediate complications. Estimated Blood Loss:     Estimated blood loss: none. Procedure:                Pre-Anesthesia Assessment:                           - Prior to the procedure, a History and Physical                            was performed, and patient medications and                            allergies were reviewed. The patient's tolerance of                            previous anesthesia was also reviewed. The risks                            and benefits of the procedure and the sedation                            options and risks were discussed with the patient.                            All questions were answered, and informed consent                            was obtained. Prior Anticoagulants: The patient has                            taken no previous anticoagulant or antiplatelet                            agents. ASA Grade Assessment: III - A patient with                            severe systemic disease. After reviewing the risks  and benefits, the patient was deemed in                            satisfactory condition to undergo the procedure.                           After obtaining informed consent, the endoscope was                            passed under direct vision. Throughout the                            procedure, the patient's blood pressure, pulse, and                            oxygen saturations  were monitored continuously. The                            [id number]) scope was introduced through the                            and advanced to the second part of duodenum. The                            upper GI endoscopy was accomplished without                            difficulty. The patient tolerated the procedure                            well. Scope In: 10:04:57 AM Scope Out: 10:11:21 AM Total Procedure Duration: 0 hours 6 minutes 24 seconds  Findings:      The examined esophagus was normal. The scope was withdrawn. Dilation was       performed with a Maloney dilator with no resistance at 97 Fr. The scope       was withdrawn. Dilation was performed with a Maloney dilator with mild       resistance at 56 Fr. The dilation site was examined following endoscope       reinsertion and showed no change. Estimated blood loss: none.      A small hiatal hernia was present.      The duodenal bulb and second portion of the duodenum were normal. Impression:               - Normal esophagus. Dilated.                           - Small hiatal hernia.                           - Normal duodenal bulb and second portion of the                            duodenum.                           -  No specimens collected. Moderate Sedation:      Moderate (conscious) sedation was administered by the endoscopy nurse       and supervised by the endoscopist. The following parameters were       monitored: oxygen saturation, heart rate, blood pressure, respiratory       rate, EKG, adequacy of pulmonary ventilation, and response to care.       Total physician intraservice time was 11 minutes. Recommendation:           - Patient has a contact number available for                            emergencies. The signs and symptoms of potential                            delayed complications were discussed with the                            patient. Return to normal activities tomorrow.                             Written discharge instructions were provided to the                            patient.                           - Resume previous diet.                           - Continue present medications.                           - Await pathology results.                           - Return to GI clinic in 3 months. Procedure Code(s):        --- Professional ---                           [phone], Esophagogastroduodenoscopy, flexible,                            transoral; diagnostic, including collection of                            specimen(s) by brushing or washing, when performed                            (separate procedure)                           43450, Dilation of esophagus, by unguided sound or                            bougie, single or multiple passes  [id number], Moderate sedation services provided by the                            same physician or other qualified health care                            professional performing the diagnostic or                            therapeutic service that the sedation supports,                            requiring the presence of an independent trained                            observer to assist in the monitoring of the                            patient's level of consciousness and physiological                            status; initial 15 minutes of intraservice time,                            patient age 67 years or older Diagnosis Code(s):        --- Professional ---                           K44.9, Diaphragmatic hernia without obstruction or                            gangrene                           R13.10, Dysphagia, unspecified CPT copyright 2016 [employer]. All rights reserved. The codes documented in this report are preliminary and upon coder review may  be revised to meet current compliance requirements. Cristopher Estimable. Trayton Szabo, MD Norvel Richards, MD 05/06/2017 10:24:48 AM This  report has been signed electronically. Number of Addenda: 0

## 2017-05-06 NOTE — H&P (Signed)
@LOGO @   Primary Care Physician:  Abran Richard, MD Primary Gastroenterologist:  Dr. Gala Romney  Pre-Procedure History & Physical: HPI:  [name] is a 67 y.o. female here for recurrent esophageal dysphagia. History of a [name]'s ring. Here for repeat EGD and dilation as appropriate.  Past Medical History:  Diagnosis Date  . Allergic rhinitis    uses Nasonex daily as needed  . Allergy to angiotensin receptor blockers (ARB) 01/22/2014   Angioedema  . Anxiety    takes Ativan daily as needed  . Aspiration pneumonia ([place])   . Asthma    Albuterol inhaler and Neb daily as needed  . 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 [name], residual palpitations  . Chronic back pain    reason unknown  . Constipation   . DDD (degenerative disc disease), lumbar   . Depression    takes Zoloft daily as well as Cymbalta  . Diabetes mellitus    takes NOvolin daily  . Dysphagia   . Dysrhythmia    SVT ==ablation done by Dr. Rayann Heman 2007  . Esophageal reflux    takes Zantac daily  . History of bronchitis Dec 2014  . Hypertension    takes Losartan daily  . Hypothyroidism    takes Synthroid daily  . Joint pain   . Joint swelling    left ankle  . Low sodium syndrome   . Lumbar radiculopathy   . Seizures ([place]) 04-05-13   one time ran out of Primidone and had stopped it cold [place]  . Tremor, essential    takes Mysoline and Neurontin daily.  . Vocal cord dysfunction   . Weakness    in left arm    Past Surgical History:  Procedure Laterality Date  . ABLATION FOR AVNRT    . BARTHOLIN GLAND CYST EXCISION     x 2  . BIOPSY  03/04/2015   Procedure: BIOPSY (Duodenal, Gastric);  Surgeon: Daneil Dolin, MD;  Location: [facility];  Service: Endoscopy;;  . CESAREAN SECTION    . CHOLECYSTECTOMY    . COLONOSCOPY  01/16/2004   QQP:YPPJKD rectum/colon  . COLONOSCOPY WITH PROPOFOL N/A 03/04/2015   Dr.Hayden Kihara- internal hemorrhoids, pancolonic  diverticulosis, colonic polyp= tubular adenoma  . ESOPHAGEAL DILATION N/A 03/04/2015   Procedure: ESOPHAGEAL DILATION WITH 54FR MALONEY DILATOR;  Surgeon: Daneil Dolin, MD;  Location: [facility];  Service: Endoscopy;  Laterality: N/A;  . ESOPHAGOGASTRODUODENOSCOPY (EGD) WITH ESOPHAGEAL DILATION  04/03/2002   TOI:[id number] ring, otherwise normal esophagus, status post dilation with 56 F/Normal stomach  . ESOPHAGOGASTRODUODENOSCOPY (EGD) WITH ESOPHAGEAL DILATION N/A 11/08/2012   Dr. Dontray Haberland:schatzki's ring s/p dilation/hiatal hernia  . ESOPHAGOGASTRODUODENOSCOPY (EGD) WITH PROPOFOL N/A 03/04/2015   Dr.Elisse Pennick- schatzki's ring, hiatal hernia, scattered erosions bx= chronic inactive gastritis  . HEMORRHOID BANDING  2017   Dr.Ariella Voit  . LEFT ANKLE LIGAMENT REPAIR     x 2  . LEFT ELBOW REPAIR    . MAXIMUM ACCESS (MAS)POSTERIOR LUMBAR INTERBODY FUSION (PLIF) 1 LEVEL N/A 04/24/2014   Procedure: Lumbar four-five Maximum access Surgery posterior lumbar interbody fusion;  Surgeon: [name], MD;  Location: [place] [facility];  Service: Neurosurgery;  Laterality: N/A;  Lumbar four-five Maximum access Surgery posterior lumbar interbody fusion  . PARTIAL HYSTERECTOMY    . POLYPECTOMY  03/04/2015   Procedure: POLYPECTOMY (descending colon);  Surgeon: Daneil Dolin, MD;  Location: [facility];  Service: Endoscopy;;  . PULSE GENERATOR IMPLANT Bilateral 12/15/2013  Procedure: BILATERAL PULSE GENERATOR IMPLANT;  Surgeon: [name], MD;  Location: [name] [facility];  Service: Neurosurgery;  Laterality: Bilateral;  BILATERAL  . RIGHT BREAST CYST     benign  . SUBTHALAMIC STIMULATOR INSERTION Bilateral 12/04/2013   Procedure: Bilateral Deep brain stimulator placement;  Surgeon: [name], MD;  Location: [place] [facility];  Service: Neurosurgery;  Laterality: Bilateral;  Bilateral Deep brain stimulator placement  . TONSILLECTOMY      Prior to Admission medications   Medication Sig Start Date End Date Taking? Authorizing Provider   acetaminophen (TYLENOL) 325 MG tablet Take 650 mg by mouth 3 (three) times daily.   Yes [provider]  albuterol (PROVENTIL) (2.5 MG/3ML) 0.083% nebulizer solution Take 2.5 mg by nebulization every 6 (six) hours as needed for wheezing or shortness of breath.   Yes [provider]  amoxicillin (AMOXIL) 500 MG capsule Take 1 capsule (500 mg total) by mouth 3 (three) times daily. 04/26/17  Yes Fransico Meadow, PA-C  aspirin 81 MG chewable tablet Chew 81 mg by mouth daily.   Yes [provider]  cyclobenzaprine (AMRIX) 15 MG 24 hr capsule Take 15 mg by mouth at bedtime.   Yes [provider]  divalproex (DEPAKOTE) 500 MG DR tablet Take 1 tablet by mouth 2 (two) times daily. 11/24/16 04/26/17 Yes [provider]  fluticasone furoate-vilanterol (BREO ELLIPTA) 100-25 MCG/INH AEPB Inhale 1 puff into the lungs daily. 12/17/16  Yes Young, Tarri Fuller D, MD  gabapentin (NEURONTIN) 300 MG capsule Take 300-600 mg by mouth See admin instructions. Take 300 mg in the morning and 600 mg at bedtime   Yes [provider]  guaifenesin (ROBITUSSIN) 100 MG/5ML syrup Take 200 mg by mouth every 6 (six) hours as needed for cough.   Yes [provider]  HYDROcodone-acetaminophen (NORCO/VICODIN) 5-325 MG tablet Take 1 tablet by mouth daily. At 2:00 pm. 10/01/16  Yes [provider]  insulin glargine (LANTUS) 100 UNIT/ML injection Inject 30 Units into the skin at bedtime.   Yes [provider]  ipratropium (ATROVENT) 0.03 % nasal spray 1-2 puffs each nostril 3 times daily, before meals Patient taking differently: Place 1-2 sprays into both nostrils 3 (three) times daily.  01/12/17  Yes Young, Tarri Fuller D, MD  JANUVIA 100 MG tablet Take 100 mg by mouth daily.  11/26/16  Yes [provider]  levothyroxine (SYNTHROID, LEVOTHROID) 75 MCG tablet TAKE (1) TABLET BY MOUTH ONCE DAILY BEFORE BREAKFAST. 01/12/15  Yes Janith Lima, MD  loperamide (IMODIUM  A-D) 2 MG tablet Take 2 mg by mouth 4 (four) times daily as needed for diarrhea or loose stools.    Yes [provider]  magnesium hydroxide (MILK OF MAGNESIA) 400 MG/5ML suspension Take 30 mLs by mouth at bedtime as needed for mild constipation.    Yes [provider]  Menthol, Topical Analgesic, (BIOFREEZE) 4 % GEL Apply 1 application topically 2 (two) times daily. Apply to lower back and base of right thumb twice daily.   Yes [provider]  metoprolol succinate (TOPROL-XL) 50 MG 24 hr tablet Take 50 mg by mouth daily. Take with or immediately following a meal.   Yes [provider]  mirabegron ER (MYRBETRIQ) 25 MG TB24 tablet Take 25 mg by mouth daily.   Yes [provider]  Multiple Vitamins-Minerals (MULTIVITAMINS THER. W/MINERALS) TABS tablet TAKE ONE TABLET BY MOUTH ONCE DAILY. 04/16/15  Yes Janith Lima, MD  NOVOLOG FLEXPEN 100 UNIT/ML FlexPen Inject 12  Units into the skin 3 (three) times daily after meals.  07/01/15  Yes [provider]  Omega 3 1000 MG CAPS TAKE (1) CAPSULE BY MOUTH ONCE DAILY. 04/16/15  Yes Janith Lima, MD  omeprazole (PRILOSEC) 20 MG capsule Take 20 mg by mouth 2 (two) times daily before a meal.   Yes [provider]  Oxycodone HCl 10 MG TABS Take 10 mg by mouth 2 (two) times daily.   Yes [provider]  polyethylene glycol (MIRALAX / GLYCOLAX) packet Take 17 g by mouth daily. May take an additional 17g as needed for constipation 11/26/16  Yes [provider]  potassium chloride SA (K-DUR,KLOR-CON) 20 MEQ tablet Take 20 mEq by mouth daily.  11/26/16  Yes [provider]  primidone (MYSOLINE) 50 MG tablet Take 2 tablets (100 mg total) by mouth 2 (two) times daily. Patient taking differently: Take 150 mg by mouth 3 (three) times daily.  03/06/15  Yes Tat, Eustace Quail, DO  QUEtiapine (SEROQUEL) 100 MG tablet Take 100 mg by mouth at bedtime.  11/25/16  Yes [provider]   sodium chloride 1 g tablet Take 1 g by mouth 2 (two) times daily.   Yes [provider]  sucralfate (CARAFATE) 1 g tablet Take 1 g by mouth 2 (two) times daily.  04/24/16  Yes [provider]  triamterene-hydrochlorothiazide (MAXZIDE-25) 37.5-25 MG tablet Take 1 tablet by mouth daily. 11/26/16  Yes [provider]    Allergies as of 03/18/2017 - Review Complete 03/18/2017  Allergen Reaction Noted  . Invokana [canagliflozin] Other (See Comments) 12/05/2014  . Losartan  01/22/2014  . Metformin and related Diarrhea 01/18/2013  . Latex Other (See Comments) 07/02/2014  . Oxycodone-acetaminophen Itching 05/11/2011    Family History  Problem Relation Age of Onset  . Lung cancer Father        DIED AGE 64 LUNG CA  . Alcohol abuse Father   . Anxiety disorder Father   . Depression Father   . COPD Mother        DIED AGE 69,MRSA,COPD,PNEUMONIA  . Pneumonia Mother        DIED AGE 69,MRSA,COPD,PNEUMONIA  . Supraventricular tachycardia Mother   . Anxiety disorder Mother   . Depression Mother   . Alcohol abuse Mother   . Heart disease Sister        DIED AGE 66, SMOKER,?HEART  . COPD Brother        AGE 104  . Colon cancer Neg Hx     Social History   Socioeconomic History  . Marital status: Married    Spouse name: Not on file  . Number of children: Not on file  . Years of education: Not on file  . Highest education level: Not on file  Social Needs  . Financial resource strain: Not on file  . Food insecurity - worry: Not on file  . Food insecurity - inability: Not on file  . Transportation needs - medical: Not on file  . Transportation needs - non-medical: Not on file  Occupational History  . Occupation: retired  Tobacco Use  . Smoking status: Former Smoker    Packs/day: 0.30    Years: 10.00    Pack years: 3.00    Types: Cigarettes    Last attempt to quit: 04/23/1993    Years since quitting: 24.0  . Smokeless tobacco: Never Used  . Tobacco comment:  quit smoking 25+yrs ago  Substance and Sexual Activity  . Alcohol use: No  Alcohol/week: 0.0 oz  . Drug use: No  . Sexual activity: No  Other Topics Concern  . Not on file  Social History Narrative   Married, 1 daughte, living.  Lives with husband in [place], [place].   1 child died brain tumor at age 61.   Two grandchildren.   Works at [employer], patient currently lost her job.   Retired 2011.   No tobacco.   Alcohol: none in 30 yrs.  Distant history of heavy use.   No drug use.    Review of Systems: See HPI, otherwise negative ROS  Physical Exam: Pulse 82   Temp 98.1 F (36.7 C) (Oral)   Resp 20  General:   Alert,  Well-developed, well-nourished, pleasant and cooperative in NAD Skin:  Intact without significant lesions or rashes. Lungs:  Clear throughout to auscultation.   No wheezes, crackles, or rhonchi. No acute distress. Heart:  Regular rate and rhythm; no murmurs, clicks, rubs,  or gallops. Abdomen: Non-distended, normal bowel sounds.  Soft and nontender without appreciable mass or hepatosplenomegaly.  Pulses:  Normal pulses noted. Extremities:  Without clubbing or edema.  Impression:  67 year old lady with recurrent dysphagia. History of a  [name]'s ring   Recommendations:  I have offered the patient an EGD today.  The risks, benefits, limitations, alternatives and imponderables have been reviewed with the patient. Potential for esophageal dilation, biopsy, etc. have also been reviewed.  Questions have been answered. All parties agreeable.  Notice: This dictation was prepared with Dragon dictation along with smaller phrase technology. Any transcriptional errors that result from this process are unintentional and may not be corrected upon review.

## 2017-05-06 NOTE — Anesthesia Postprocedure Evaluation (Signed)
Anesthesia Post Note  Patient: [name]  Procedure(s) Performed: ESOPHAGOGASTRODUODENOSCOPY (EGD) WITH PROPOFOL (N/A ) MALONEY DILATION (N/A )  Patient location during evaluation: PACU Anesthesia Type: MAC Level of consciousness: awake and patient cooperative Pain management: pain level controlled Respiratory status: spontaneous breathing, nonlabored ventilation and respiratory function stable Cardiovascular status: blood pressure returned to baseline Postop Assessment: no apparent nausea or vomiting Anesthetic complications: no     Last Vitals:  Vitals:   05/06/17 0945 05/06/17 0950  BP:    Pulse:    Resp: (!) 24 (!) 24  Temp:    SpO2:      Last Pain:  Vitals:   05/06/17 1000  TempSrc:   PainSc: 0-No pain                 Talia Hoheisel J

## 2017-05-06 NOTE — Anesthesia Preprocedure Evaluation (Signed)
Anesthesia Evaluation  Patient identified by MRN, date of birth, ID band Patient awake    Reviewed: Allergy & Precautions, NPO status , Patient's Chart, lab work & pertinent test results, reviewed documented beta blocker date and time   History of Anesthesia Complications Negative for: history of anesthetic complications  Airway Mallampati: II  TM Distance: >3 FB Neck ROM: Full    Dental  (+) Caps, Dental Advidsory Given, Poor Dentition   Pulmonary asthma , sleep apnea , former smoker,    breath sounds clear to auscultation       Cardiovascular hypertension, Pt. on medications and Pt. on home beta blockers (-) angina+ dysrhythmias Supra Ventricular Tachycardia  Rhythm:Regular Rate:Normal     Neuro/Psych Seizures -,  PSYCHIATRIC DISORDERS Anxiety Depression  Neuromuscular disease    GI/Hepatic GERD  ,  Endo/Other  diabetes, Type 2Hypothyroidism Morbid obesity  Renal/GU      Musculoskeletal  (+) Arthritis ,   Abdominal (+) + obese,   Peds  Hematology   Anesthesia Other Findings Hx vocal cord dysfunction  Reproductive/Obstetrics                             Anesthesia Physical Anesthesia Plan  ASA: III  Anesthesia Plan: MAC   Post-op Pain Management:    Induction: Intravenous  PONV Risk Score and Plan:   Airway Management Planned: Simple Face Mask  Additional Equipment:   Intra-op Plan:   Post-operative Plan:   Informed Consent: I have reviewed the patients History and Physical, chart, labs and discussed the procedure including the risks, benefits and alternatives for the proposed anesthesia with the patient or authorized representative who has indicated his/her understanding and acceptance.     Plan Discussed with:   Anesthesia Plan Comments:         Anesthesia Quick Evaluation

## 2017-05-07 ENCOUNTER — Encounter: Payer: Self-pay | Admitting: Cardiovascular Disease

## 2017-05-07 ENCOUNTER — Ambulatory Visit (INDEPENDENT_AMBULATORY_CARE_PROVIDER_SITE_OTHER): Payer: Medicare Other | Admitting: Cardiovascular Disease

## 2017-05-07 ENCOUNTER — Other Ambulatory Visit: Payer: Self-pay

## 2017-05-07 VITALS — BP 130/83 | HR 80 | Ht 64.5 in | Wt 183.0 lb

## 2017-05-07 DIAGNOSIS — R600 Localized edema: Secondary | ICD-10-CM | POA: Diagnosis not present

## 2017-05-07 DIAGNOSIS — R079 Chest pain, unspecified: Secondary | ICD-10-CM

## 2017-05-07 DIAGNOSIS — Z9889 Other specified postprocedural states: Secondary | ICD-10-CM | POA: Diagnosis not present

## 2017-05-07 DIAGNOSIS — I10 Essential (primary) hypertension: Secondary | ICD-10-CM | POA: Diagnosis not present

## 2017-05-07 DIAGNOSIS — R000 Tachycardia, unspecified: Secondary | ICD-10-CM | POA: Diagnosis not present

## 2017-05-07 NOTE — Patient Instructions (Signed)
Your physician wants you to follow-up in: 1 YEAR WITH DR KONESWARAN You will receive a reminder letter in the mail two months in advance. If you don't receive a letter, please call our office to schedule the follow-up appointment.  Your physician recommends that you continue on your current medications as directed. Please refer to the Current Medication list given to you today.  Thank you for choosing [facility]!!    

## 2017-05-07 NOTE — Progress Notes (Signed)
SUBJECTIVE: The patient returns for follow-up of tachycardia and leg swelling.  Additional history includes essential tremor with deep brain stimulator placement and diabetic peripheral neuropathy.  She was reportedly evaluated for chest pain in [place] 2 months ago.  I will have to request these records.  She said she was kept overnight for observation.  She denies having undergone any cardiac testing.  She has had no chest pain since.  She has had 2 falls.  She denies palpitations, orthopnea, and paroxysmal nocturnal dyspnea.  She said she has severe GERD.  She underwent upper endoscopy yesterday and had esophageal dilatation.  I reviewed an ECG performed on 03/08/17 which demonstrated sinus rhythm and nonspecific T wave abnormalities in leads I and aVL   Soc Hx: Divorced after 34 yrs of marriage. Her ex-husband is also my patient.   Review of Systems: As per "subjective", otherwise negative.  Allergies  Allergen Reactions  . Invokana [Canagliflozin] Other (See Comments)    Yeast infectios  . Losartan     Angioedema   . Metformin And Related Diarrhea  . Latex Other (See Comments)    Other Reaction: redness, blisters  . Other Other (See Comments)    Air fresheners - on MAR  . Oxycodone-Acetaminophen Itching    TYLOX. Caused internal itching per patient   . Mucinex [Guaifenesin Er] Other (See Comments)    MUCINEX REACTION: asthma attacks    No current facility-administered medications for this visit.    No current outpatient medications on file.   Facility-Administered Medications Ordered in Other Visits  Medication Dose Route Frequency Provider Last Rate Last Dose  . Chlorhexidine Gluconate Cloth 2 % PADS 6 each  6 each Topical Once [name], Cristopher Estimable, MD       And  . Chlorhexidine Gluconate Cloth 2 % PADS 6 each  6 each Topical Once [name], Cristopher Estimable, MD      . lactated ringers infusion   Intravenous Continuous Lerry Liner, MD   Stopped at 05/06/17 1113     Past Medical History:  Diagnosis Date  . Allergic rhinitis    uses Nasonex daily as needed  . Allergy to angiotensin receptor blockers (ARB) 01/22/2014   Angioedema  . Anxiety    takes Ativan daily as needed  . Aspiration pneumonia ([place])   . Asthma    Albuterol inhaler and Neb daily as needed  . 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 [name], residual palpitations  . Chronic back pain    reason unknown  . Constipation   . DDD (degenerative disc disease), lumbar   . Depression    takes Zoloft daily as well as Cymbalta  . Diabetes mellitus    takes NOvolin daily  . Dysphagia   . Dysrhythmia    SVT ==ablation done by Dr. Rayann Heman 2007  . Esophageal reflux    takes Zantac daily  . History of bronchitis Dec 2014  . Hypertension    takes Losartan daily  . Hypothyroidism    takes Synthroid daily  . Joint pain   . Joint swelling    left ankle  . Low sodium syndrome   . Lumbar radiculopathy   . Seizures ([place]) 04-05-13   one time ran out of Primidone and had stopped it cold [place]  . Tremor, essential    takes Mysoline and Neurontin daily.  . Vocal cord dysfunction   . Weakness    in left  arm    Past Surgical History:  Procedure Laterality Date  . ABLATION FOR AVNRT    . BARTHOLIN GLAND CYST EXCISION     x 2  . BIOPSY  03/04/2015   Procedure: BIOPSY (Duodenal, Gastric);  Surgeon: Daneil Dolin, MD;  Location: [facility];  Service: Endoscopy;;  . CESAREAN SECTION    . CHOLECYSTECTOMY    . COLONOSCOPY  01/16/2004   YSA:YTKZSW rectum/colon  . COLONOSCOPY WITH PROPOFOL N/A 03/04/2015   Dr.[name]- internal hemorrhoids, pancolonic diverticulosis, colonic polyp= tubular adenoma  . ESOPHAGEAL DILATION N/A 03/04/2015   Procedure: ESOPHAGEAL DILATION WITH 54FR MALONEY DILATOR;  Surgeon: Daneil Dolin, MD;  Location: [facility];  Service: Endoscopy;  Laterality: N/A;  . ESOPHAGOGASTRODUODENOSCOPY (EGD) WITH ESOPHAGEAL DILATION   04/03/2002   FUX:[id number] ring, otherwise normal esophagus, status post dilation with 56 F/Normal stomach  . ESOPHAGOGASTRODUODENOSCOPY (EGD) WITH ESOPHAGEAL DILATION N/A 11/08/2012   Dr. [name]:schatzki's ring s/p dilation/hiatal hernia  . ESOPHAGOGASTRODUODENOSCOPY (EGD) WITH PROPOFOL N/A 03/04/2015   Dr.[name]- schatzki's ring, hiatal hernia, scattered erosions bx= chronic inactive gastritis  . HEMORRHOID BANDING  2017   Dr.[name]  . LEFT ANKLE LIGAMENT REPAIR     x 2  . LEFT ELBOW REPAIR    . MAXIMUM ACCESS (MAS)POSTERIOR LUMBAR INTERBODY FUSION (PLIF) 1 LEVEL N/A 04/24/2014   Procedure: Lumbar four-five Maximum access Surgery posterior lumbar interbody fusion;  Surgeon: [name], MD;  Location: [place] [facility];  Service: Neurosurgery;  Laterality: N/A;  Lumbar four-five Maximum access Surgery posterior lumbar interbody fusion  . PARTIAL HYSTERECTOMY    . POLYPECTOMY  03/04/2015   Procedure: POLYPECTOMY (descending colon);  Surgeon: Daneil Dolin, MD;  Location: [facility];  Service: Endoscopy;;  . PULSE GENERATOR IMPLANT Bilateral 12/15/2013   Procedure: BILATERAL PULSE GENERATOR IMPLANT;  Surgeon: [name], MD;  Location: [place] [facility];  Service: Neurosurgery;  Laterality: Bilateral;  BILATERAL  . RIGHT BREAST CYST     benign  . SUBTHALAMIC STIMULATOR INSERTION Bilateral 12/04/2013   Procedure: Bilateral Deep brain stimulator placement;  Surgeon: [name], MD;  Location: [place] [facility];  Service: Neurosurgery;  Laterality: Bilateral;  Bilateral Deep brain stimulator placement  . TONSILLECTOMY      Social History   Socioeconomic History  . Marital status: Married    Spouse name: Not on file  . Number of children: Not on file  . Years of education: Not on file  . Highest education level: Not on file  Social Needs  . Financial resource strain: Not on file  . Food insecurity - worry: Not on file  . Food insecurity - inability: Not on file  . Transportation needs - medical: Not on  file  . Transportation needs - non-medical: Not on file  Occupational History  . Occupation: retired  Tobacco Use  . Smoking status: Former Smoker    Packs/day: 0.30    Years: 10.00    Pack years: 3.00    Types: Cigarettes    Last attempt to quit: 04/23/1993    Years since quitting: 24.0  . Smokeless tobacco: Never Used  . Tobacco comment: quit smoking 25+yrs ago  Substance and Sexual Activity  . Alcohol use: No    Alcohol/week: 0.0 oz  . Drug use: No  . Sexual activity: No  Other Topics Concern  . Not on file  Social History Narrative   Married, 1 daughte, living.  Lives with husband in [place], [place].   1 child died brain tumor at age 48.  Two grandchildren.   Works at [employer], patient currently lost her job.   Retired 2011.   No tobacco.   Alcohol: none in 30 yrs.  Distant history of heavy use.   No drug use.     Vitals:   05/07/17 0933  BP: 130/83  Pulse: 80  SpO2: 97%  Weight: 183 lb (83 kg)  Height: 5' 4.5" (1.638 m)    Wt Readings from Last 3 Encounters:  04/26/17 183 lb (83 kg)  03/08/17 180 lb (81.6 kg)  12/11/16 176 lb 9.6 oz (80.1 kg)     PHYSICAL EXAM General: NAD HEENT: Tremor noted. Neck: No JVD, no thyromegaly. Lungs: Clear to auscultation bilaterally with normal respiratory effort. CV: Regular rate and rhythm, normal S1/S2, no S3/S4, no murmur.  Trace bilateral lower extremity edema.  No carotid bruit.   Abdomen: Soft, nontender, no distention.  Neurologic: Alert and oriented.  Psych: Normal affect. Skin: Normal. Musculoskeletal: No gross deformities.    ECG: Most recent ECG reviewed.   Labs: Lab Results  Component Value Date/Time   K 3.7 03/08/2017 07:07 AM   BUN 11 03/08/2017 07:07 AM   CREATININE 0.55 03/08/2017 07:07 AM   CREATININE 0.70 12/27/2012 10:06 AM   ALT 36 03/08/2017 07:07 AM   TSH 1.01 05/21/2015 11:57 AM   HGB 12.9 03/08/2017 07:07 AM     Lipids: Lab Results  Component Value Date/Time   LDLCALC  80 02/13/2015 02:55 PM   CHOL 180 02/13/2015 02:55 PM   TRIG 175.0 (H) 02/13/2015 02:55 PM   HDL 64.80 02/13/2015 02:55 PM       ASSESSMENT AND PLAN:  1. 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 Continue metoprolol for tachycardia.  2. Essential HTN: BP controlled today. No changes.  3.  Bilateral leg edema: Due to chronic venous insufficiency. No edema today.  We talked about the possible use of compression stockings.  As her symptoms are very mild, she defers at this time.  4.  Chest pain: No recurrences.  I will request records for personal review from [place].    Disposition: Follow up 1 year   Kate Sable, M.D., F.A.C.C.

## 2017-05-10 ENCOUNTER — Encounter (HOSPITAL_COMMUNITY): Payer: Self-pay | Admitting: Internal Medicine

## 2017-05-18 ENCOUNTER — Telehealth: Payer: Self-pay | Admitting: Neurology

## 2017-05-18 NOTE — Telephone Encounter (Signed)
Spoke with [facility] outpatient imaging. They are not sure why Dr. Doristine Devoid name was written as ordering provider. They will fix this mistake and forward results to [facility] who ordered the scan.   [name] Juluis Rainier.

## 2017-05-18 NOTE — Telephone Encounter (Signed)
Jade, I just got a report of MRI cervical spine back on the patient that lists me as ordering provider.  I did not order anything on her and haven't seen her since July.  Please have them correct MRI report (done at [facility]) and send to appropriate physician.

## 2017-05-28 ENCOUNTER — Ambulatory Visit (INDEPENDENT_AMBULATORY_CARE_PROVIDER_SITE_OTHER): Payer: Medicare Other | Admitting: Internal Medicine

## 2017-05-28 ENCOUNTER — Encounter: Payer: Self-pay | Admitting: Internal Medicine

## 2017-05-28 DIAGNOSIS — J3089 Other allergic rhinitis: Secondary | ICD-10-CM

## 2017-05-28 DIAGNOSIS — J4540 Moderate persistent asthma, uncomplicated: Secondary | ICD-10-CM | POA: Diagnosis not present

## 2017-05-28 DIAGNOSIS — J302 Other seasonal allergic rhinitis: Secondary | ICD-10-CM | POA: Diagnosis not present

## 2017-05-28 MED ORDER — ALBUTEROL SULFATE HFA 108 (90 BASE) MCG/ACT IN AERS: INHALATION_SPRAY | RESPIRATORY_TRACT | 12 refills | Status: DC

## 2017-05-28 NOTE — Patient Instructions (Signed)
Script printed to refill your albuterol rescue inhaler  Ok to continue other meds as before

## 2017-05-28 NOTE — Assessment & Plan Note (Signed)
She has asthma but often symptom overlap with her vocal cord dysfunction and upper airway noise. Plan-continue current meds.  Refill for her albuterol rescue inhaler.

## 2017-05-28 NOTE — Assessment & Plan Note (Signed)
Postnasal drip now likely includes a vasomotor component.  I reminded her of her Atrovent 0.03% nasal spray to help with drainage if needed.

## 2017-05-28 NOTE — Progress Notes (Signed)
Patient ID: [name], female    DOB: 07-Dec-1950, 67 y.o.   MRN: [id number]  HPI  female former smoker followed for Allergic rhinitis, Asthma, complicated by anxiety/VCD, CAD, GERD, DM, tachycardia, tremor/deep brain stimulator, HBP, now living in assisted living Allergy Profile 01/10/2013-total IgE 166 with significant elevations for most common allergens except molds She had been on allergy vaccine years ago. Office Spirometry 12/13/2014-mild obstruction, mild restriction of forced vital capacity Upper endoscopy 2016-[name]'s ring and gastric erosions ----------------------------------------------------------  12/11/16- 67 year old female former smoker followed for Allergic rhinitis, Asthma, complicated by anxiety/VCD, CAD, GERD, DM, tachycardia, tremor/deep brain stimulator, HBP, now living in assisted living ACUTE VISIT: Pt has had several asthma attacks lately-few that have woken her from her sleep. Pt has had to use inhaler and nebulizer more often as well.  Breo 100, neb DuoNeb,    She is able to use her nebulizer machine with ipratropium-albuterol 2 or 3 times daily as needed. Does not think she is refluxing worse than usual and attributes increased wheezing/asthma in recent weeks to seasonal change. Not much sneeze or drainage. No obvious infection. She had needed behavioral health intervention for mood disorder and historically her emotional upsets have been important triggers for complaints of asthma. Today she feels much improved. CXR 11/16/16 IMPRESSION: No active cardiopulmonary disease.  05/28/17-  67 year old female former smoker followed for Allergic rhinitis, Asthma, complicated by anxiety/VCD, CAD, GERD, DM, tachycardia, tremor/deep brain stimulator, HBP, now living in assisted living She reports some asthma earlier this winter, then had done very well in the last 3 weeks but feels a little tighter today.  Some bothersome postnasal drainage with no obvious infection.   Recent dental amoxicillin and she is pending more extractions. Daily Breo 100. Uses neb and rescue inhaler only occasionally. CXR 03/15/17- 1V, [place]- report only- NAD  ROS-see HPI    "+" = pos Constitutional:    weight loss, night sweats, fevers, chills, fatigue, lassitude. HEENT:    headaches, difficulty swallowing, tooth/dental problems, sore throat,       sneezing, itching, ear ache, nasal congestion, +post nasal drip, snoring CV:    chest pain, orthopnea, PND, swelling in lower extremities, anasarca,                                                  dizziness, palpitations Resp:   +shortness of breath with exertion or at rest.                +productive cough,   + non-productive cough, coughing up of blood.              change in color of mucus.  +wheezing.   Skin:    rash or lesions. GI:  + heartburn, indigestion, abdominal pain, nausea, vomiting, GU:  MS:   joint pain, stiffness, decreased range of motion, back pain. Neuro-    + chronic tremor Psych:  +change in mood or affect.  +depression or anxiety.   memory loss.  OBJ- Physical Exam General- Alert, Oriented, Affect-appropriate, Distress- none acute, talkative, wheelchair Skin- rash-none, lesions- none, excoriation- none Lymphadenopathy- none Head- atraumatic            Eyes- Gross vision intact, PERRLA, conjunctivae and secretions clear            Ears- Hearing, canals-normal  Nose- Clear, no-Septal dev, mucus, polyps, erosion, perforation             Throat- Mallampati II , mucosa -tongue slightly coated , drainage- none, tonsils- atrophic, mild hoarseness Neck- flexible , trachea midline, no stridor , thyroid nl, carotid no bruit Chest - symmetrical excursion , unlabored           Heart/CV- RRR , no murmur , no gallop  , no rub, nl s1 s2                           - JVD- none , edema- none, stasis changes- none, varices- none           Lung- wheeze + trace., cough-none , dullness-none, rub- none,  rhonchi-none           Chest wall-  Abd-  Br/ Gen/ Rectal- Not done, not indicated Extrem- cyanosis- none, clubbing, none, atrophy- none, strength- nl, + rolling walker Neuro- + tremor and head bob,+ mild dysphonia

## 2017-06-01 ENCOUNTER — Telehealth: Payer: Self-pay | Admitting: Internal Medicine

## 2017-06-01 MED ORDER — SALINE SPRAY 0.65 % NA SOLN
1.0000 | NASAL | 5 refills | Status: DC | PRN
Start: 2017-06-01 — End: 2017-06-03

## 2017-06-01 NOTE — Telephone Encounter (Signed)
Called pt who stated she was wanting to know if a letter could be written and faxed to [facility] stating that pt is able to keep the saline nasal spray in her room with her.  Dr. Annamaria Boots, please advise if you are okay with [place] writing a letter for pt.  Thanks!

## 2017-06-01 NOTE — Telephone Encounter (Signed)
Yes- ok to say that she can keep her bottle of saline nasal spray in her room for self administration ad lib as needed for dry nose.

## 2017-06-01 NOTE — Telephone Encounter (Signed)
Letter typed up and will fax it to [facility] for pt. Nothing further needed at this current time.

## 2017-06-02 ENCOUNTER — Telehealth: Payer: Self-pay | Admitting: Internal Medicine

## 2017-06-02 NOTE — Telephone Encounter (Signed)
CY completed forms and Aldona Bar is aware that I have faxed forms back to her. Sent to scan  In Epic for our records. Nothing more needed at this time.

## 2017-06-02 NOTE — Telephone Encounter (Signed)
Spoke with Joellen Jersey, we do not have the surgical clearance form. I called Dr. Parke Simmers office and they are going to refax this form to [facility] attention.

## 2017-06-03 ENCOUNTER — Telehealth: Payer: Self-pay | Admitting: Internal Medicine

## 2017-06-03 MED ORDER — SALINE SPRAY 0.65 % NA SOLN: 1.0000 | Freq: Three times a day (TID) | NASAL | 5 refills | Status: DC

## 2017-06-03 NOTE — Telephone Encounter (Signed)
rx has been updated and sent back to the pharmacy with new sig.  Nothing further is needed.

## 2017-06-04 ENCOUNTER — Ambulatory Visit (INDEPENDENT_AMBULATORY_CARE_PROVIDER_SITE_OTHER): Payer: Medicare Other | Admitting: Podiatry

## 2017-06-04 ENCOUNTER — Encounter: Payer: Self-pay | Admitting: Podiatry

## 2017-06-04 DIAGNOSIS — B351 Tinea unguium: Secondary | ICD-10-CM | POA: Diagnosis not present

## 2017-06-04 DIAGNOSIS — M79675 Pain in left toe(s): Secondary | ICD-10-CM | POA: Diagnosis not present

## 2017-06-04 DIAGNOSIS — E1142 Type 2 diabetes mellitus with diabetic polyneuropathy: Secondary | ICD-10-CM

## 2017-06-04 DIAGNOSIS — M79674 Pain in right toe(s): Secondary | ICD-10-CM

## 2017-06-04 DIAGNOSIS — M21372 Foot drop, left foot: Secondary | ICD-10-CM

## 2017-06-04 DIAGNOSIS — M21371 Foot drop, right foot: Secondary | ICD-10-CM

## 2017-06-04 NOTE — Progress Notes (Signed)
Complaint:  Visit Type: Patient returns to my office for continued preventative foot care services. Complaint: Patient states" my nails have grown long and thick and become painful to walk and wear shoes" Patient has been diagnosed with DM with neuropathy.. The patient presents for preventative foot care services. No changes to ROS.  Patient also wears AFO braces due to foot drop.  Podiatric Exam: Vascular: Deferred Sensorium: Deferred Nail Exam: Pt has thick disfigured discolored nails with subungual debris noted bilateral entire nail hallux through fifth toenails Ulcer Exam: There is no evidence of ulcer or pre-ulcerative changes or infection. Orthopedic Exam: Muscle tone and strength are WNL. No limitations in general ROM. No crepitus or effusions noted. Foot type and digits show no abnormalities. Bony prominences are unremarkable. Skin: No Porokeratosis. No infection or ulcers  Diagnosis:  Onychomycosis, , Pain in right toe, pain in left toes  Treatment & Plan Procedures and Treatment: Consent by patient was obtained for treatment procedures.   Debridement of mycotic and hypertrophic toenails, 1 through 5 bilateral and clearing of subungual debris. No ulceration, no infection noted.  Return Visit-Office Procedure: Patient instructed to return to the office for a follow up visit 3 months for continued evaluation and treatment.    Gardiner Barefoot DPM

## 2017-06-07 ENCOUNTER — Telehealth: Payer: Self-pay | Admitting: Internal Medicine

## 2017-06-07 NOTE — Telephone Encounter (Signed)
lmtcb for pt with staff at [facility].

## 2017-06-07 NOTE — Telephone Encounter (Signed)
Lm again for pt with staff at [facility].

## 2017-06-07 NOTE — Telephone Encounter (Signed)
Pt is calling back [phone]

## 2017-06-08 MED ORDER — SALINE SPRAY 0.65 % NA SOLN: NASAL | 99 refills | Status: DC

## 2017-06-08 NOTE — Telephone Encounter (Signed)
Called pt, dvised nurse Rx sent in. Nothing further is needed.

## 2017-06-08 NOTE — Telephone Encounter (Signed)
Nasal saline spray   1 bottle  1 or 2 puffs each nostril every hour for rhinitis    Refill prn

## 2017-06-08 NOTE — Telephone Encounter (Signed)
Called and spoke with patient, she is needing [place] to fax a prescription of the saline nose spray to her pharmacy with the directions on how to use it. Example 2 times a day instead of PRN. CY is this ok to do. Thanks.   Current Outpatient Medications on File Prior to Visit  Medication Sig Dispense Refill  . acetaminophen (TYLENOL) 325 MG tablet Take 650 mg by mouth 3 (three) times daily.    Marland Kitchen albuterol (PROAIR HFA) 108 (90 Base) MCG/ACT inhaler Inhale 2 puffs every 6 hours for asthma - rescue inhaler 1 Inhaler 12  . albuterol (PROVENTIL) (2.5 MG/3ML) 0.083% nebulizer solution Take 2.5 mg by nebulization every 6 (six) hours as needed for wheezing or shortness of breath.    Marland Kitchen aspirin 81 MG chewable tablet Chew 81 mg by mouth daily.    . fluticasone furoate-vilanterol (BREO ELLIPTA) 100-25 MCG/INH AEPB Inhale 1 puff into the lungs daily. 1 each 11  . gabapentin (NEURONTIN) 300 MG capsule Take 300-600 mg by mouth See admin instructions. Take 300 mg in the morning and 600 mg at bedtime    . guaifenesin (ROBITUSSIN) 100 MG/5ML syrup Take 200 mg by mouth every 6 (six) hours as needed for cough.    Marland Kitchen HYDROcodone-acetaminophen (NORCO/VICODIN) 5-325 MG tablet Take 1 tablet by mouth daily. At 2:00 pm.    . insulin glargine (LANTUS) 100 UNIT/ML injection Inject 30 Units into the skin at bedtime.    Marland Kitchen ipratropium (ATROVENT) 0.03 % nasal spray 1-2 puffs each nostril 3 times daily, before meals (Patient taking differently: Place 1-2 sprays into both nostrils 3 (three) times daily. ) 30 mL 11  . lamoTRIgine (LAMICTAL) 100 MG tablet Take 100 mg by mouth daily.    Marland Kitchen levothyroxine (SYNTHROID, LEVOTHROID) 75 MCG tablet TAKE (1) TABLET BY MOUTH ONCE DAILY BEFORE BREAKFAST. 30 tablet 5  . loperamide (IMODIUM A-D) 2 MG tablet Take 2 mg by mouth 4 (four) times daily as needed for diarrhea or loose stools.     . magnesium hydroxide (MILK OF MAGNESIA) 400 MG/5ML suspension Take 30 mLs by mouth at bedtime as needed for mild  constipation.     . Menthol, Topical Analgesic, (BIOFREEZE) 4 % GEL Apply 1 application topically 2 (two) times daily. Apply to lower back and base of right thumb twice daily.    . metoprolol succinate (TOPROL-XL) 50 MG 24 hr tablet Take 50 mg by mouth daily. Take with or immediately following a meal.    . mirabegron ER (MYRBETRIQ) 25 MG TB24 tablet Take 25 mg by mouth daily.    . Multiple Vitamins-Minerals (MULTIVITAMINS THER. W/MINERALS) TABS tablet TAKE ONE TABLET BY MOUTH ONCE DAILY. 30 each 11  . NOVOLOG FLEXPEN 100 UNIT/ML FlexPen Inject 12 Units into the skin 3 (three) times daily after meals.     . Omega 3 1000 MG CAPS TAKE (1) CAPSULE BY MOUTH ONCE DAILY. 30 capsule 11  . omeprazole (PRILOSEC) 20 MG capsule Take 20 mg by mouth 2 (two) times daily before a meal.    . Oxycodone HCl 10 MG TABS Take 10 mg by mouth 2 (two) times daily.    . polyethylene glycol (MIRALAX / GLYCOLAX) packet Take 17 g by mouth daily. May take an additional 17g as needed for constipation    . primidone (MYSOLINE) 50 MG tablet Take 2 tablets (100 mg total) by mouth 2 (two) times daily. (Patient taking differently: Take 150 mg by mouth 2 (two) times daily. ) 360 tablet 1  .  sodium chloride (OCEAN) 0.65 % SOLN nasal spray Place 1 spray into both nostrils 3 (three) times daily. 30 mL 5  . sucralfate (CARAFATE) 1 g tablet Take 1 g by mouth 2 (two) times daily.      No current facility-administered medications on file prior to visit.      Allergies  Allergen Reactions  . Invokana [Canagliflozin] Other (See Comments)    Yeast infectios  . Losartan     Angioedema   . Metformin And Related Diarrhea  . Latex Other (See Comments)    Other Reaction: redness, blisters  . Other Other (See Comments)    Air fresheners - on MAR  . Oxycodone-Acetaminophen Itching    TYLOX. Caused internal itching per patient   . Mucinex [Guaifenesin Er] Other (See Comments)    MUCINEX REACTION: asthma attacks

## 2017-06-15 NOTE — Progress Notes (Signed)
Referring Provider: Abran Richard, MD Primary Care Physician:  Abran Richard, MD Primary GI:  Dr. Gala Romney  Chief Complaint  Patient presents with  . Gastroesophageal Reflux    HPI:   [name] is a 67 y.o. female who presents for follow-up on constipation and GERD.  The patient was last seen in our office 03/18/2017 for GERD, esophageal stenosis, drug-induced constipation.  Tonic history of gastritis, GERD.  Also chronic history of constipation.  At her last visit she was having continued constipation with a bowel movement when she eats bran cereal but still straining and hard stools.  GERD symptoms persist and has breakthrough about every other day still on omeprazole twice daily and Carafate twice daily.  Not hematochezia or melena.  Felt Protonix worked better and is requesting to go back to that.  No other GI symptoms.  Recent hospitalization at [facility] for chest pain/observation which ruled out for acute cardiac etiology.  Recommended stop Prilosec, start Protonix 40 mg daily.  Start Movantik 25 mg daily.  Call in 1-2 weeks with progress report.  Schedule upper endoscopy.  Follow-up in 3 months.  Follow-up call 03/25/2017 indicated per her pharmacy Movantik is a noncovered medication.  Recommended Amitiza 24 mcg twice a day and request progress report.  Samples were provided.  EGD completed 05/06/2017 which found normal esophagus status post dilation, small hiatal hernia, normal duodenum.  Continue current medications.  Follow-up in 3 months.  Today she states she's doing well.  Her GERD symptoms are much improved.  She is on Protonix.  Constipation much improved on Amitiza 24 mcg twice daily.  She has a bowel movement about every day.  Rarely has to strain but when she does strain she will have some hemorrhoid flares.Does not have any rectal cream for hemorrhoids. Denies abdominal pain, N/V, hematochezia, melena, fever, chills, unintentional weight loss. Denies chest  pain, dyspnea, dizziness, lightheadedness, syncope, near syncope. Denies any other upper or lower GI symptoms.  Past Medical History:  Diagnosis Date  . Allergic rhinitis    uses Nasonex daily as needed  . Allergy to angiotensin receptor blockers (ARB) 01/22/2014   Angioedema  . Anxiety    takes Ativan daily as needed  . Aspiration pneumonia ([place])   . Asthma    Albuterol inhaler and Neb daily as needed  . 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 [name], residual palpitations  . Chronic back pain    reason unknown  . Constipation   . DDD (degenerative disc disease), lumbar   . Depression    takes Zoloft daily as well as Cymbalta  . Diabetes mellitus    takes NOvolin daily  . Dysphagia   . Dysrhythmia    SVT ==ablation done by Dr. Rayann Heman 2007  . Esophageal reflux    takes Zantac daily  . History of bronchitis Dec 2014  . Hypertension    takes Losartan daily  . Hypothyroidism    takes Synthroid daily  . Joint pain   . Joint swelling    left ankle  . Low sodium syndrome   . Lumbar radiculopathy   . Seizures ([place]) 04-05-13   one time ran out of Primidone and had stopped it cold [place]  . Tremor, essential    takes Mysoline and Neurontin daily.  . Vocal cord dysfunction   . Weakness    in left arm    Past Surgical History:  Procedure Laterality Date  .  ABLATION FOR AVNRT    . BARTHOLIN GLAND CYST EXCISION     x 2  . BIOPSY  03/04/2015   Procedure: BIOPSY (Duodenal, Gastric);  Surgeon: Daneil Dolin, MD;  Location: [facility];  Service: Endoscopy;;  . CESAREAN SECTION    . CHOLECYSTECTOMY    . COLONOSCOPY  01/16/2004   NLG:XQJJHE rectum/colon  . COLONOSCOPY WITH PROPOFOL N/A 03/04/2015   Dr.[name]- internal hemorrhoids, pancolonic diverticulosis, colonic polyp= tubular adenoma  . ESOPHAGEAL DILATION N/A 03/04/2015   Procedure: ESOPHAGEAL DILATION WITH 54FR MALONEY DILATOR;  Surgeon: Daneil Dolin, MD;  Location: [facility];   Service: Endoscopy;  Laterality: N/A;  . ESOPHAGOGASTRODUODENOSCOPY (EGD) WITH ESOPHAGEAL DILATION  04/03/2002   RDE:[id number]'H ring, otherwise normal esophagus, status post dilation with 56 F/Normal stomach  . ESOPHAGOGASTRODUODENOSCOPY (EGD) WITH ESOPHAGEAL DILATION N/A 11/08/2012   Dr. [name]:schatzki's ring s/p dilation/hiatal hernia  . ESOPHAGOGASTRODUODENOSCOPY (EGD) WITH PROPOFOL N/A 03/04/2015   Dr.[name]- schatzki's ring, hiatal hernia, scattered erosions bx= chronic inactive gastritis  . ESOPHAGOGASTRODUODENOSCOPY (EGD) WITH PROPOFOL N/A 05/06/2017   Procedure: ESOPHAGOGASTRODUODENOSCOPY (EGD) WITH PROPOFOL;  Surgeon: Daneil Dolin, MD;  Location: [facility];  Service: Endoscopy;  Laterality: N/A;  10:15am  . HEMORRHOID BANDING  2017   Dr.[name]  . LEFT ANKLE LIGAMENT REPAIR     x 2  . LEFT ELBOW REPAIR    . MALONEY DILATION N/A 05/06/2017   Procedure: Venia Minks DILATION;  Surgeon: Daneil Dolin, MD;  [facility];  Service: Endoscopy;  Laterality: N/A;  . MAXIMUM ACCESS (MAS)POSTERIOR LUMBAR INTERBODY FUSION (PLIF) 1 LEVEL N/A 04/24/2014   Procedure: Lumbar four-five Maximum access Surgery posterior lumbar interbody fusion;  Surgeon: [name], MD;  Location: [place] [facility] OR[facility];  Service: Neurosurgery;  Laterality: N/A;  Lumbar four-five Maximum access Surgery posterior lumbar interbody fusion  . PARTIAL HYSTERECTOMY    . POLYPECTOMY  03/04/2015   Procedure: POLYPECTOMY (descending colon);  Surgeon: Daneil Dolin, MD;  Location: [facility];  Service: Endoscopy;;  . PULSE GENERATOR IMPLANT Bilateral 12/15/2013   Procedure: BILATERAL PULSE GENERATOR IMPLANT;  Surgeon: [name], MD;  Location: [place] [facility] ORS;  Service: Neurosurgery;  Laterality: Bilateral;  BILATERAL  . RIGHT BREAST CYST     benign  . SUBTHALAMIC STIMULATOR INSERTION Bilateral 12/04/2013   Procedure: Bilateral Deep brain stimulator placement;  Surgeon: [name], MD;  Location: [place] NE[facility] ORS;  Service:  Neurosurgery;  Laterality: Bilateral;  Bilateral Deep brain stimulator placement  . TONSILLECTOMY      Current Outpatient Medications  Medication Sig Dispense Refill  . acetaminophen (TYLENOL) 325 MG tablet Take 650 mg by mouth 3 (three) times daily.    Marland Kitchen albuterol (PROAIR HFA) 108 (90 Base) MCG/ACT inhaler Inhale 2 puffs every 6 hours for asthma - rescue inhaler (Patient taking differently: Inhale 2 puffs into the lungs every 6 (six) hours. ) 1 Inhaler 12  . albuterol (PROVENTIL) (2.5 MG/3ML) 0.083% nebulizer solution Take 2.5 mg by nebulization every 6 (six) hours as needed for wheezing or shortness of breath.    Marland Kitchen aspirin 81 MG chewable tablet Chew 81 mg by mouth daily.    . diphenhydrAMINE (BENADRYL) 25 mg capsule Take 25 mg by mouth 2 (two) times daily as needed for itching or allergies.    Marland Kitchen divalproex (DEPAKOTE) 250 MG DR tablet Take 250 mg by mouth 2 (two) times daily.    . divalproex (DEPAKOTE) 500 MG DR tablet Take 500 mg by mouth 2 (two) times daily.    Marland Kitchen  fluticasone furoate-vilanterol (BREO ELLIPTA) 100-25 MCG/INH AEPB Inhale 1 puff into the lungs daily. 1 each 11  . gabapentin (NEURONTIN) 300 MG capsule Take 300-600 mg by mouth See admin instructions. Take 300 mg in the morning and 600 mg at bedtime    . guaifenesin (ROBITUSSIN) 100 MG/5ML syrup Take 200 mg by mouth every 6 (six) hours as needed for cough.    Marland Kitchen HYDROcodone-acetaminophen (NORCO/VICODIN) 5-325 MG tablet Take 1 tablet by mouth 3 (three) times daily.     . insulin glargine (LANTUS) 100 UNIT/ML injection Inject 30 Units into the skin at bedtime.    Marland Kitchen ipratropium (ATROVENT) 0.03 % nasal spray 1-2 puffs each nostril 3 times daily, before meals (Patient taking differently: Place 1-2 sprays into both nostrils 3 (three) times daily. ) 30 mL 11  . levothyroxine (SYNTHROID, LEVOTHROID) 75 MCG tablet TAKE (1) TABLET BY MOUTH ONCE DAILY BEFORE BREAKFAST. 30 tablet 5  . loperamide (IMODIUM A-D) 2 MG tablet Take 2 mg by mouth 4  (four) times daily as needed for diarrhea or loose stools.     . lubiprostone (AMITIZA) 24 MCG capsule Take 24 mcg by mouth 2 (two) times daily with a meal.    . magnesium hydroxide (MILK OF MAGNESIA) 400 MG/5ML suspension Take 30 mLs by mouth at bedtime as needed for mild constipation.     . Menthol, Topical Analgesic, (BIOFREEZE) 4 % GEL Apply 1 application topically 2 (two) times daily. Apply to lower back and base of right thumb twice daily.    . metoprolol succinate (TOPROL-XL) 50 MG 24 hr tablet Take 50 mg by mouth daily. Take with or immediately following a meal.    . mirabegron ER (MYRBETRIQ) 25 MG TB24 tablet Take 25 mg by mouth daily.    . Multiple Vitamins-Minerals (MULTIVITAMINS THER. W/MINERALS) TABS tablet TAKE ONE TABLET BY MOUTH ONCE DAILY. 30 each 11  . naloxegol oxalate (MOVANTIK) 25 MG TABS tablet Take 25 mg by mouth daily.    Marland Kitchen NOVOLOG FLEXPEN 100 UNIT/ML FlexPen Inject 8 Units into the skin 3 (three) times daily before meals.     . Omega 3 1000 MG CAPS TAKE (1) CAPSULE BY MOUTH ONCE DAILY. 30 capsule 11  . pantoprazole (PROTONIX) 40 MG tablet Take 40 mg by mouth daily.    . polyethylene glycol (MIRALAX / GLYCOLAX) packet Take 17 g by mouth daily as needed for moderate constipation.     . potassium chloride SA (K-DUR,KLOR-CON) 20 MEQ tablet Take 20 mEq by mouth daily.    . primidone (MYSOLINE) 50 MG tablet Take 2 tablets (100 mg total) by mouth 2 (two) times daily. 360 tablet 1  . QUEtiapine (SEROQUEL) 100 MG tablet Take 100 mg by mouth at bedtime.    . sitaGLIPtin (JANUVIA) 100 MG tablet Take 100 mg by mouth daily.    . sodium chloride (OCEAN) 0.65 % SOLN nasal spray Take 1-2 sprays in nostril every hour as needed for rhinitis 30 mL PRN  . sodium chloride 1 g tablet Take 1 g by mouth 2 (two) times daily.    . sucralfate (CARAFATE) 1 g tablet Take 1 g by mouth 2 (two) times daily.     Marland Kitchen triamterene-hydrochlorothiazide (MAXZIDE-25) 37.5-25 MG tablet Take 1 tablet by mouth daily.      No current facility-administered medications for this visit.     Allergies as of 06/16/2017 - Review Complete 06/16/2017  Allergen Reaction Noted  . Invokana [canagliflozin] Other (See Comments) 12/05/2014  . Losartan Other (  See Comments) 01/22/2014  . Metformin and related Diarrhea 01/18/2013  . Latex Other (See Comments) 07/02/2014  . Other Other (See Comments) 07/02/2014  . Oxycodone-acetaminophen Itching and Other (See Comments) 05/11/2011  . Mucinex [guaifenesin er] Other (See Comments)     Family History  Problem Relation Age of Onset  . Lung cancer Father        DIED AGE 91 LUNG CA  . Alcohol abuse Father   . Anxiety disorder Father   . Depression Father   . COPD Mother        DIED AGE 39,MRSA,COPD,PNEUMONIA  . Pneumonia Mother        DIED AGE 39,MRSA,COPD,PNEUMONIA  . Supraventricular tachycardia Mother   . Anxiety disorder Mother   . Depression Mother   . Alcohol abuse Mother   . Heart disease Sister        DIED AGE 45, SMOKER,?HEART  . COPD Brother        AGE 93  . Colon cancer Neg Hx     Social History   Socioeconomic History  . Marital status: Married    Spouse name: None  . Number of children: None  . Years of education: None  . Highest education level: None  Social Needs  . Financial resource strain: None  . Food insecurity - worry: None  . Food insecurity - inability: None  . Transportation needs - medical: None  . Transportation needs - non-medical: None  Occupational History  . Occupation: retired  Tobacco Use  . Smoking status: Former Smoker    Packs/day: 0.30    Years: 10.00    Pack years: 3.00    Types: Cigarettes    Last attempt to quit: 04/23/1993    Years since quitting: 24.1  . Smokeless tobacco: Never Used  . Tobacco comment: quit smoking 25+yrs ago  Substance and Sexual Activity  . Alcohol use: No    Alcohol/week: 0.0 oz  . Drug use: No  . Sexual activity: No  Other Topics Concern  . None  Social History Narrative    Married, 1 daughte, living.  Lives with husband in [place], [place].   1 child died brain tumor at age 58.   Two grandchildren.   Works at [employer], patient currently lost her job.   Retired 2011.   No tobacco.   Alcohol: none in 30 yrs.  Distant history of heavy use.   No drug use.    Review of Systems: General: Negative for anorexia, weight loss, fever, chills, fatigue, weakness. ENT: Negative for hoarseness, difficulty swallowing. CV: Negative for chest pain, angina, palpitations, peripheral edema.  Respiratory: Negative for dyspnea at rest, cough, sputum, wheezing.  GI: See history of present illness. Endo: Negative for unusual weight change. Admits difficult to control blood sugars. Neuro: Admits history of essential tremors/worse with hyperglycemia episodes. Heme: Negative for bruising or bleeding. Allergy: Negative for rash or hives.   Physical Exam: BP 107/75   Pulse 85   Temp 97.6 F (36.4 C) (Oral)   Ht 5' 4.5" (1.638 m)   Wt 182 lb 9.6 oz (82.8 kg)   BMI 30.86 kg/m  General:   Alert and oriented. Pleasant and cooperative. Well-nourished and well-developed. Slow speech (chronic). Eyes:  Without icterus, sclera clear and conjunctiva pink.  Ears:  Normal auditory acuity. Cardiovascular:  S1, S2 present without murmurs appreciated. Extremities without clubbing or edema. Respiratory:  Clear to auscultation bilaterally. No wheezes, rales, or rhonchi. No distress.  Gastrointestinal:  +BS, soft, non-tender  and non-distended. No HSM noted. No guarding or rebound. No masses appreciated.  Rectal:  Deferred  Musculoskalatal:  Symmetrical without gross deformities. Neurologic:  Alert and oriented x4;  grossly normal neurologically. Psych:  Alert and cooperative. Normal mood and affect. Heme/Lymph/Immune: No excessive bruising noted.    06/16/2017 10:31 AM   Disclaimer: This note was dictated with voice recognition software. Similar sounding words can inadvertently  be transcribed and may not be corrected upon review.

## 2017-06-15 NOTE — H&P (Signed)
HISTORY AND PHYSICAL  [name] is a 67 y.o. female patient  Referred by general dentist for multiple extractions. C/o painful teeth  No diagnosis found.  Past Medical History:  Diagnosis Date  . Allergic rhinitis    uses Nasonex daily as needed  . Allergy to angiotensin receptor blockers (ARB) 01/22/2014   Angioedema  . Anxiety    takes Ativan daily as needed  . Aspiration pneumonia ([place])   . Asthma    Albuterol inhaler and Neb daily as needed  . 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 [name], residual palpitations  . Chronic back pain    reason unknown  . Constipation   . DDD (degenerative disc disease), lumbar   . Depression    takes Zoloft daily as well as Cymbalta  . Diabetes mellitus    takes NOvolin daily  . Dysphagia   . Dysrhythmia    SVT ==ablation done by Dr. Rayann Heman 2007  . Esophageal reflux    takes Zantac daily  . History of bronchitis Dec 2014  . Hypertension    takes Losartan daily  . Hypothyroidism    takes Synthroid daily  . Joint pain   . Joint swelling    left ankle  . Low sodium syndrome   . Lumbar radiculopathy   . Seizures ([place]) 04-05-13   one time ran out of Primidone and had stopped it cold [place]  . Tremor, essential    takes Mysoline and Neurontin daily.  . Vocal cord dysfunction   . Weakness    in left arm    No current facility-administered medications for this encounter.    Current Outpatient Medications  Medication Sig Dispense Refill  . acetaminophen (TYLENOL) 325 MG tablet Take 650 mg by mouth 3 (three) times daily.    Marland Kitchen albuterol (PROAIR HFA) 108 (90 Base) MCG/ACT inhaler Inhale 2 puffs every 6 hours for asthma - rescue inhaler 1 Inhaler 12  . albuterol (PROVENTIL) (2.5 MG/3ML) 0.083% nebulizer solution Take 2.5 mg by nebulization every 6 (six) hours as needed for wheezing or shortness of breath.    Marland Kitchen aspirin 81 MG chewable tablet Chew 81 mg by mouth daily.    .  fluticasone furoate-vilanterol (BREO ELLIPTA) 100-25 MCG/INH AEPB Inhale 1 puff into the lungs daily. 1 each 11  . gabapentin (NEURONTIN) 300 MG capsule Take 300-600 mg by mouth See admin instructions. Take 300 mg in the morning and 600 mg at bedtime    . guaifenesin (ROBITUSSIN) 100 MG/5ML syrup Take 200 mg by mouth every 6 (six) hours as needed for cough.    Marland Kitchen HYDROcodone-acetaminophen (NORCO/VICODIN) 5-325 MG tablet Take 1 tablet by mouth daily. At 2:00 pm.    . insulin glargine (LANTUS) 100 UNIT/ML injection Inject 30 Units into the skin at bedtime.    Marland Kitchen ipratropium (ATROVENT) 0.03 % nasal spray 1-2 puffs each nostril 3 times daily, before meals (Patient taking differently: Place 1-2 sprays into both nostrils 3 (three) times daily. ) 30 mL 11  . lamoTRIgine (LAMICTAL) 100 MG tablet Take 100 mg by mouth daily.    Marland Kitchen levothyroxine (SYNTHROID, LEVOTHROID) 75 MCG tablet TAKE (1) TABLET BY MOUTH ONCE DAILY BEFORE BREAKFAST. 30 tablet 5  . loperamide (IMODIUM A-D) 2 MG tablet Take 2 mg by mouth 4 (four) times daily as needed for diarrhea or loose stools.     . magnesium hydroxide (MILK OF MAGNESIA) 400 MG/5ML suspension Take 30 mLs by  mouth at bedtime as needed for mild constipation.     . Menthol, Topical Analgesic, (BIOFREEZE) 4 % GEL Apply 1 application topically 2 (two) times daily. Apply to lower back and base of right thumb twice daily.    . metoprolol succinate (TOPROL-XL) 50 MG 24 hr tablet Take 50 mg by mouth daily. Take with or immediately following a meal.    . mirabegron ER (MYRBETRIQ) 25 MG TB24 tablet Take 25 mg by mouth daily.    . Multiple Vitamins-Minerals (MULTIVITAMINS THER. W/MINERALS) TABS tablet TAKE ONE TABLET BY MOUTH ONCE DAILY. 30 each 11  . NOVOLOG FLEXPEN 100 UNIT/ML FlexPen Inject 12 Units into the skin 3 (three) times daily after meals.     . Omega 3 1000 MG CAPS TAKE (1) CAPSULE BY MOUTH ONCE DAILY. 30 capsule 11  . omeprazole (PRILOSEC) 20 MG capsule Take 20 mg by mouth 2  (two) times daily before a meal.    . Oxycodone HCl 10 MG TABS Take 10 mg by mouth 2 (two) times daily.    . polyethylene glycol (MIRALAX / GLYCOLAX) packet Take 17 g by mouth daily. May take an additional 17g as needed for constipation    . primidone (MYSOLINE) 50 MG tablet Take 2 tablets (100 mg total) by mouth 2 (two) times daily. (Patient taking differently: Take 150 mg by mouth 2 (two) times daily. ) 360 tablet 1  . sodium chloride (OCEAN) 0.65 % SOLN nasal spray Take 1-2 sprays in nostril every hour as needed for rhinitis 30 mL PRN  . sucralfate (CARAFATE) 1 g tablet Take 1 g by mouth 2 (two) times daily.      Allergies  Allergen Reactions  . Invokana [Canagliflozin] Other (See Comments)    Yeast infectios  . Losartan     Angioedema   . Metformin And Related Diarrhea  . Latex Other (See Comments)    Other Reaction: redness, blisters  . Other Other (See Comments)    Air fresheners - on MAR  . Oxycodone-Acetaminophen Itching    TYLOX. Caused internal itching per patient   . Mucinex [Guaifenesin Er] Other (See Comments)    MUCINEX REACTION: asthma attacks   Active Problems:   * No active hospital problems. *  Vitals: There were no vitals taken for this visit. Lab results:No results found for this or any previous visit (from the past 71 hour(s)). Radiology Results: No results found. General appearance: alert, cooperative, moderately obese and slurred speech Head: Normocephalic, without obvious abnormality, atraumatic Eyes: negative Nose: Nares normal. Septum midline. Mucosa normal. No drainage or sinus tenderness. Throat: multiple carious teeth. no purulence, edema, trismus. Pharynx clear. Neck: no adenopathy, supple, symmetrical, trachea midline and thyroid not enlarged, symmetric, no tenderness/mass/nodules Resp: clear to auscultation bilaterally Cardio: regular rate and rhythm, S1, S2 normal, no murmur, click, rub or gallop  Assessment:Multiple nonrestorable teeth  secondary to dental caries  Plan: Multiple dental extractions with alveoloplasty. General anesthesia. Day surgery.    Diona Browner 06/15/2017

## 2017-06-16 ENCOUNTER — Encounter: Payer: Self-pay | Admitting: Nurse Practitioner

## 2017-06-16 ENCOUNTER — Ambulatory Visit (INDEPENDENT_AMBULATORY_CARE_PROVIDER_SITE_OTHER): Payer: Medicare Other | Admitting: Nurse Practitioner

## 2017-06-16 VITALS — BP 107/75 | HR 85 | Temp 97.6°F | Ht 64.5 in | Wt 182.6 lb

## 2017-06-16 DIAGNOSIS — K648 Other hemorrhoids: Secondary | ICD-10-CM

## 2017-06-16 DIAGNOSIS — K219 Gastro-esophageal reflux disease without esophagitis: Secondary | ICD-10-CM

## 2017-06-16 DIAGNOSIS — K5903 Drug induced constipation: Secondary | ICD-10-CM

## 2017-06-16 MED ORDER — HYDROCORTISONE 1 % RE CREA: 1.0000 "application " | TOPICAL_CREAM | Freq: Two times a day (BID) | RECTAL | 11 refills | Status: DC | PRN

## 2017-06-16 NOTE — Progress Notes (Signed)
cc'ed to pcp °

## 2017-06-16 NOTE — Assessment & Plan Note (Signed)
The patient notes hemorrhoid flares when she has rare constipation.  No Anusol currently on her formulary.  I will send in Anusol to apply up to twice a day for up to 10 days at a time for hemorrhoid symptoms.  Denies overt hematochezia.  Follow-up in 6 months.

## 2017-06-16 NOTE — Addendum Note (Signed)
Addended by: Gordy Levan, Khalilah Hoke A on: 06/16/2017 10:34 AM   Modules accepted: Orders

## 2017-06-16 NOTE — Assessment & Plan Note (Signed)
Constipation is significantly improved with Amitiza 24 mcg twice daily.  Recommend she continue this.  Call with any worsening symptoms, hematochezia, melena.  We will send in Anusol rectal cream for rare breakthrough constipation that causes hemorrhoid symptoms.  Follow-up in 6 months.

## 2017-06-16 NOTE — Assessment & Plan Note (Signed)
GERD much improved on Protonix.  Recommend she continue her current medications.  Follow-up in 6 months.  Call if any worsening symptoms.  She was able to verbalize/teach back my instructions.

## 2017-06-16 NOTE — Patient Instructions (Signed)
1. Continue taking your current medications. 2. Call us if you have any worsening heartburn, worsening constipation, or seen any blood in your stools. 3. I have sent in a prescription of Anusol cream to your pharmacy that the staff at your facility can apply if you do have hemorrhoid symptoms. 4. Follow-up in 6 months. 5. Call us if you have any questions or concerns.  Have a Great Summer!    At [facility] we value your feedback. You may receive a survey about your visit today. Please share your experience as we strive to create trusing relationships with our patients to provide genuine, compassionate, quality care.

## 2017-06-17 ENCOUNTER — Encounter (HOSPITAL_COMMUNITY): Payer: Self-pay | Admitting: *Deleted

## 2017-06-17 NOTE — Progress Notes (Signed)
Pt is a resident at [facility]. I spoke with Caren Griffins, med tech and pt both for pre-op call. Pt states the only cardiac history she has is SVT and has had an ablation. Pt is a type 2 diabetic. States her last A1C was 6.0 1 or 2 months ago. I faxed pre-op instructions to Caren Griffins at [phone]. Those instructions included having pt take 1/2 of her regular dose of Lantus Insulin tonight and in the AM - to take 15 units. Instructed that pt have her blood sugar checked in the AM when she gets up and every 2 hours until she leaves for the hospital. If blood sugar is 70 or below, treat with 1/2 cup of clear juice (apple or cranberry) and recheck blood sugar 15 minutes after drinking juice. If blood sugar continues to be 70 or below, call the Short Stay department and ask to speak to a nurse.

## 2017-06-17 NOTE — Anesthesia Preprocedure Evaluation (Addendum)
Anesthesia Evaluation  Patient identified by MRN, date of birth, ID band Patient awake    Reviewed: Allergy & Precautions, NPO status , Patient's Chart, lab work & pertinent test results, reviewed documented beta blocker date and time   History of Anesthesia Complications Negative for: history of anesthetic complications  Airway Mallampati: II  TM Distance: >3 FB Neck ROM: Full    Dental  (+) Caps, Dental Advidsory Given, Poor Dentition   Pulmonary asthma , sleep apnea , former smoker,    breath sounds clear to auscultation       Cardiovascular hypertension, Pt. on medications and Pt. on home beta blockers (-) angina+ dysrhythmias Supra Ventricular Tachycardia  Rhythm:Regular Rate:Normal  [id number] Study Conclusions  - Left ventricle: The cavity size was normal. Wall thickness was increased in a pattern of moderate LVH. The estimated ejection fraction was 65%. Wall motion was normal; there were no regional wall motion abnormalities. - Right ventricle: The cavity size was mildly dilated. Systolic function was normal.   Neuro/Psych Seizures -,  PSYCHIATRIC DISORDERS Anxiety Depression Deep brain stimulator for essential tremors   Neuromuscular disease    GI/Hepatic GERD  ,  Endo/Other  diabetes, Type 2Hypothyroidism Morbid obesity  Renal/GU      Musculoskeletal  (+) Arthritis ,   Abdominal (+) + obese,   Peds  Hematology negative hematology ROS (+)   Anesthesia Other Findings Hx vocal cord dysfunction  Reproductive/Obstetrics                            Anesthesia Physical  Anesthesia Plan  ASA: III  Anesthesia Plan: General   Post-op Pain Management:    Induction: Intravenous  PONV Risk Score and Plan: 2 and Ondansetron and Treatment may vary due to age or medical condition  Airway Management Planned: Oral ETT  Additional Equipment:   Intra-op Plan:   Post-operative  Plan: Extubation in OR  Informed Consent: I have reviewed the patients History and Physical, chart, labs and discussed the procedure including the risks, benefits and alternatives for the proposed anesthesia with the patient or authorized representative who has indicated his/her understanding and acceptance.     Plan Discussed with: CRNA, Anesthesiologist and Surgeon  Anesthesia Plan Comments: (  )       Anesthesia Quick Evaluation

## 2017-06-17 NOTE — Pre-Procedure Instructions (Addendum)
   [name]  06/17/2017    [name]'s procedure is scheduled on Friday, 06/18/17 at 9:30 AM.   Report to [facility] Entrance "A" Admitting Office at 7:00 AM.   Call this number if you have problems the morning of surgery: [phone]  Remember:  Patient is not to eat food or drink liquids after midnight tonight.  Take these medicines the morning of surgery with A SIP OF WATER: Acetaminophen (Tylenol), Divalproex (Depakote), Gabapentin (Neurontin), Levothyroxine (Synthroid), Metoprolol (Toprol XL), Primidone (Mysoline), Hydrocodone - if needed, Atrovent Nasal Spray, Breo Ellipta inhaler, Albuterol nebulizer - if needed, Albuterol inhaler - if needed (have patient bring this inhaler with her to the hospital)  Give patient 15 units of Lantus Insulin tonight and in the AM. Please check her blood sugar in the AM when she gets up and every 2 hours until she leaves for the hospital. If blood sugar is 70 or below, treat with 1/2 cup of clear juice (apple or cranberry) and recheck blood sugar 15 minutes after drinking juice. If blood sugar continues to be 70 or below, call the Short Stay department and ask to speak to a nurse.  Per order of Dr. Laurie Panda, Anesthesiologist/Dante Roudebush Stan Head, RN   Do not wear jewelry, make-up or nail polish.  Do not wear lotions, powders, or perfumes, or deodorant.  Do not shave 48 hours prior to surgery.  Men may shave face and neck.  Do not bring valuables to the hospital.  [facility] is not responsible for any belongings or valuables.  Contacts, dentures or bridgework may not be worn into surgery.  Leave your suitcase in the car.  After surgery it may be brought to your room.  For patients admitted to the hospital, discharge time will be determined by your treatment team.   If any questions today (06/17/17) please call me, Lilia Pro, RN at [phone]

## 2017-06-18 ENCOUNTER — Other Ambulatory Visit: Payer: Self-pay

## 2017-06-18 ENCOUNTER — Encounter (HOSPITAL_COMMUNITY)
Admission: RE | Disposition: A | Discharge status: Home / Self Care | Payer: Self-pay | Source: Ambulatory Visit | Attending: Oral Surgery

## 2017-06-18 ENCOUNTER — Encounter (HOSPITAL_COMMUNITY): Payer: Self-pay | Admitting: *Deleted

## 2017-06-18 ENCOUNTER — Ambulatory Visit (HOSPITAL_COMMUNITY): Payer: Medicare Other | Admitting: Anesthesiology

## 2017-06-18 ENCOUNTER — Ambulatory Visit (HOSPITAL_COMMUNITY)
Admission: RE | Admit: 2017-06-18 | Discharge: 2017-06-18 | Disposition: A | Discharge status: Home / Self Care | Payer: Medicare Other | Source: Ambulatory Visit | Attending: Oral Surgery | Admitting: Oral Surgery

## 2017-06-18 DIAGNOSIS — Z794 Long term (current) use of insulin: Secondary | ICD-10-CM | POA: Diagnosis not present

## 2017-06-18 DIAGNOSIS — Z79890 Hormone replacement therapy (postmenopausal): Secondary | ICD-10-CM | POA: Insufficient documentation

## 2017-06-18 DIAGNOSIS — Z6830 Body mass index (BMI) 30.0-30.9, adult: Secondary | ICD-10-CM | POA: Insufficient documentation

## 2017-06-18 DIAGNOSIS — I471 Supraventricular tachycardia: Secondary | ICD-10-CM | POA: Diagnosis not present

## 2017-06-18 DIAGNOSIS — F419 Anxiety disorder, unspecified: Secondary | ICD-10-CM | POA: Diagnosis not present

## 2017-06-18 DIAGNOSIS — K219 Gastro-esophageal reflux disease without esophagitis: Secondary | ICD-10-CM | POA: Insufficient documentation

## 2017-06-18 DIAGNOSIS — I10 Essential (primary) hypertension: Secondary | ICD-10-CM | POA: Diagnosis not present

## 2017-06-18 DIAGNOSIS — K029 Dental caries, unspecified: Secondary | ICD-10-CM | POA: Insufficient documentation

## 2017-06-18 DIAGNOSIS — Z79891 Long term (current) use of opiate analgesic: Secondary | ICD-10-CM | POA: Diagnosis not present

## 2017-06-18 DIAGNOSIS — Z79899 Other long term (current) drug therapy: Secondary | ICD-10-CM | POA: Diagnosis not present

## 2017-06-18 DIAGNOSIS — E119 Type 2 diabetes mellitus without complications: Secondary | ICD-10-CM | POA: Diagnosis not present

## 2017-06-18 DIAGNOSIS — Z87891 Personal history of nicotine dependence: Secondary | ICD-10-CM | POA: Insufficient documentation

## 2017-06-18 DIAGNOSIS — Z7982 Long term (current) use of aspirin: Secondary | ICD-10-CM | POA: Diagnosis not present

## 2017-06-18 DIAGNOSIS — G250 Essential tremor: Secondary | ICD-10-CM | POA: Diagnosis not present

## 2017-06-18 DIAGNOSIS — F329 Major depressive disorder, single episode, unspecified: Secondary | ICD-10-CM | POA: Diagnosis not present

## 2017-06-18 DIAGNOSIS — G4730 Sleep apnea, unspecified: Secondary | ICD-10-CM | POA: Diagnosis not present

## 2017-06-18 DIAGNOSIS — Z7951 Long term (current) use of inhaled steroids: Secondary | ICD-10-CM | POA: Diagnosis not present

## 2017-06-18 DIAGNOSIS — J45909 Unspecified asthma, uncomplicated: Secondary | ICD-10-CM | POA: Diagnosis not present

## 2017-06-18 DIAGNOSIS — E039 Hypothyroidism, unspecified: Secondary | ICD-10-CM | POA: Diagnosis not present

## 2017-06-18 HISTORY — PX: MULTIPLE EXTRACTIONS WITH ALVEOLOPLASTY: SHX5342

## 2017-06-18 HISTORY — DX: Chronic obstructive pulmonary disease, unspecified: J44.9

## 2017-06-18 LAB — CBC
HCT: 37.9 % (ref 36.0–46.0)
Hemoglobin: 13.1 g/dL (ref 12.0–15.0)
MCH: 31.9 pg (ref 26.0–34.0)
MCHC: 34.6 g/dL (ref 30.0–36.0)
MCV: 92.2 fL (ref 78.0–100.0)
Platelets: 168 10*3/uL (ref 150–400)
RBC: 4.11 MIL/uL (ref 3.87–5.11)
RDW: 12.8 % (ref 11.5–15.5)
WBC: 4.6 10*3/uL (ref 4.0–10.5)

## 2017-06-18 LAB — GLUCOSE, CAPILLARY
Glucose-Capillary: 147 mg/dL — ABNORMAL HIGH (ref 65–99)
Glucose-Capillary: 198 mg/dL — ABNORMAL HIGH (ref 65–99)

## 2017-06-18 LAB — BASIC METABOLIC PANEL
Anion gap: 11 (ref 5–15)
BUN: 9 mg/dL (ref 6–20)
CO2: 23 mmol/L (ref 22–32)
Calcium: 9 mg/dL (ref 8.9–10.3)
Chloride: 96 mmol/L — ABNORMAL LOW (ref 101–111)
Creatinine, Ser: 0.62 mg/dL (ref 0.44–1.00)
GFR calc Af Amer: >60 mL/min (ref >60)
GFR calc non Af Amer: >60 mL/min (ref >60)
Glucose, Bld: 163 mg/dL — ABNORMAL HIGH (ref 65–99)
Potassium: 4 mmol/L (ref 3.5–5.1)
Sodium: 130 mmol/L — ABNORMAL LOW (ref 135–145)

## 2017-06-18 LAB — HEMOGLOBIN A1C
Hgb A1c MFr Bld: 6.4 % — ABNORMAL HIGH (ref 4.8–5.6)
Mean Plasma Glucose: 136.98 mg/dL

## 2017-06-18 SURGERY — MULTIPLE EXTRACTION WITH ALVEOLOPLASTY
Anesthesia: Monitor Anesthesia Care | Site: Mouth

## 2017-06-18 MED ORDER — MEPERIDINE HCL 50 MG/ML IJ SOLN: 6.2500 mg | INTRAMUSCULAR | Status: DC | PRN

## 2017-06-18 MED ORDER — OXYMETAZOLINE HCL 0.05 % NA SOLN
NASAL | Status: DC | PRN
  Administered 2017-06-18: 1

## 2017-06-18 MED ORDER — FENTANYL CITRATE (PF) 250 MCG/5ML IJ SOLN
INTRAMUSCULAR | Status: AC
  Filled 2017-06-18: qty 5

## 2017-06-18 MED ORDER — SUGAMMADEX SODIUM 200 MG/2ML IV SOLN
INTRAVENOUS | Status: DC | PRN
  Administered 2017-06-18: 165.2 mg via INTRAVENOUS

## 2017-06-18 MED ORDER — 0.9 % SODIUM CHLORIDE (POUR BTL) OPTIME
TOPICAL | Status: DC | PRN
Start: 2017-06-18 — End: 2017-06-18
  Administered 2017-06-18: 1000 mL

## 2017-06-18 MED ORDER — ONDANSETRON HCL 4 MG/2ML IJ SOLN
INTRAMUSCULAR | Status: DC | PRN
  Administered 2017-06-18: 4 mg via INTRAVENOUS

## 2017-06-18 MED ORDER — PHENYLEPHRINE 40 MCG/ML (10ML) SYRINGE FOR IV PUSH (FOR BLOOD PRESSURE SUPPORT)
PREFILLED_SYRINGE | INTRAVENOUS | Status: DC | PRN
  Administered 2017-06-18 (×2): 80 ug via INTRAVENOUS

## 2017-06-18 MED ORDER — ONDANSETRON HCL 4 MG/2ML IJ SOLN: 4.0000 mg | Freq: Once | INTRAMUSCULAR | Status: DC | PRN

## 2017-06-18 MED ORDER — OXYMETAZOLINE HCL 0.05 % NA SOLN
NASAL | Status: AC
  Filled 2017-06-18: qty 15

## 2017-06-18 MED ORDER — FENTANYL CITRATE (PF) 100 MCG/2ML IJ SOLN: 25.0000 ug | INTRAMUSCULAR | Status: DC | PRN

## 2017-06-18 MED ORDER — LIDOCAINE-EPINEPHRINE 2 %-1:100000 IJ SOLN
INTRAMUSCULAR | Status: AC
  Filled 2017-06-18: qty 1

## 2017-06-18 MED ORDER — ROCURONIUM BROMIDE 10 MG/ML (PF) SYRINGE
PREFILLED_SYRINGE | INTRAVENOUS | Status: DC | PRN
  Administered 2017-06-18: 40 mg via INTRAVENOUS

## 2017-06-18 MED ORDER — LIDOCAINE HCL (CARDIAC) 20 MG/ML IV SOLN
INTRAVENOUS | Status: AC
  Filled 2017-06-18: qty 5

## 2017-06-18 MED ORDER — CEFAZOLIN SODIUM-DEXTROSE 2-4 GM/100ML-% IV SOLN
2.0000 g | INTRAVENOUS | Status: AC
  Administered 2017-06-18: 2 g via INTRAVENOUS
  Filled 2017-06-18: qty 100

## 2017-06-18 MED ORDER — MIDAZOLAM HCL 2 MG/2ML IJ SOLN
INTRAMUSCULAR | Status: AC
  Filled 2017-06-18: qty 2

## 2017-06-18 MED ORDER — DEXAMETHASONE SODIUM PHOSPHATE 10 MG/ML IJ SOLN
INTRAMUSCULAR | Status: DC | PRN
  Administered 2017-06-18: 5 mg via INTRAVENOUS

## 2017-06-18 MED ORDER — KETOROLAC TROMETHAMINE 30 MG/ML IJ SOLN: 30.0000 mg | Freq: Once | INTRAMUSCULAR | Status: DC | PRN

## 2017-06-18 MED ORDER — AMOXICILLIN 500 MG PO CAPS: 500.0000 mg | ORAL_CAPSULE | Freq: Three times a day (TID) | ORAL | 0 refills | Status: DC

## 2017-06-18 MED ORDER — OXYMETAZOLINE HCL 0.05 % NA SOLN
NASAL | Status: DC | PRN
  Administered 2017-06-18: 2 via NASAL

## 2017-06-18 MED ORDER — ROCURONIUM BROMIDE 10 MG/ML (PF) SYRINGE
PREFILLED_SYRINGE | INTRAVENOUS | Status: AC
  Filled 2017-06-18: qty 5

## 2017-06-18 MED ORDER — SUGAMMADEX SODIUM 200 MG/2ML IV SOLN
INTRAVENOUS | Status: AC
  Filled 2017-06-18: qty 2

## 2017-06-18 MED ORDER — LIDOCAINE 2% (20 MG/ML) 5 ML SYRINGE
INTRAMUSCULAR | Status: DC | PRN
  Administered 2017-06-18: 40 mg via INTRAVENOUS

## 2017-06-18 MED ORDER — OXYCODONE-ACETAMINOPHEN 5-325 MG PO TABS: 1.0000 | ORAL_TABLET | ORAL | 0 refills | Status: DC | PRN

## 2017-06-18 MED ORDER — ACETAMINOPHEN 160 MG/5ML PO SOLN: 325.0000 mg | ORAL | Status: DC | PRN

## 2017-06-18 MED ORDER — OXYCODONE HCL 5 MG PO TABS: 5.0000 mg | ORAL_TABLET | Freq: Once | ORAL | Status: DC | PRN

## 2017-06-18 MED ORDER — LACTATED RINGERS IV SOLN
INTRAVENOUS | Status: DC
  Administered 2017-06-18 (×2): via INTRAVENOUS

## 2017-06-18 MED ORDER — ONDANSETRON HCL 4 MG/2ML IJ SOLN
INTRAMUSCULAR | Status: AC
  Filled 2017-06-18: qty 2

## 2017-06-18 MED ORDER — PROPOFOL 10 MG/ML IV BOLUS
INTRAVENOUS | Status: DC | PRN
  Administered 2017-06-18: 160 mg via INTRAVENOUS

## 2017-06-18 MED ORDER — ACETAMINOPHEN 325 MG PO TABS: 325.0000 mg | ORAL_TABLET | ORAL | Status: DC | PRN

## 2017-06-18 MED ORDER — DEXAMETHASONE SODIUM PHOSPHATE 10 MG/ML IJ SOLN
INTRAMUSCULAR | Status: AC
  Filled 2017-06-18: qty 1

## 2017-06-18 MED ORDER — OXYCODONE HCL 5 MG/5ML PO SOLN: 5.0000 mg | Freq: Once | ORAL | Status: DC | PRN

## 2017-06-18 MED ORDER — SODIUM CHLORIDE 0.9 % IR SOLN
Status: DC | PRN
  Administered 2017-06-18: 1000 mL

## 2017-06-18 SURGICAL SUPPLY — 29 items
BUR CROSS CUT FISSURE 1.6 (BURR) ×2 IMPLANT
BUR EGG ELITE 4.0 (BURR) ×2 IMPLANT
CANISTER SUCT 3000ML PPV (MISCELLANEOUS) ×2 IMPLANT
COVER SURGICAL LIGHT HANDLE (MISCELLANEOUS) ×2 IMPLANT
CRADLE DONUT ADULT HEAD (MISCELLANEOUS) ×2 IMPLANT
DECANTER SPIKE VIAL GLASS SM (MISCELLANEOUS) IMPLANT
DRAPE U-SHAPE 76X120 STRL (DRAPES) ×2 IMPLANT
FLUID NSS /IRRIG 1000 ML XXX (MISCELLANEOUS) ×2 IMPLANT
GAUZE PACKING FOLDED 2  STR (GAUZE/BANDAGES/DRESSINGS) ×1
GAUZE PACKING FOLDED 2 STR (GAUZE/BANDAGES/DRESSINGS) ×1 IMPLANT
GLOVE BIO SURGEON STRL SZ 6.5 (GLOVE) ×2 IMPLANT
GLOVE BIO SURGEON STRL SZ7.5 (GLOVE) ×2 IMPLANT
GLOVE BIOGEL PI IND STRL 7.0 (GLOVE) IMPLANT
GLOVE BIOGEL PI INDICATOR 7.0 (GLOVE)
GOWN STRL REUS W/ TWL LRG LVL3 (GOWN DISPOSABLE) ×1 IMPLANT
GOWN STRL REUS W/ TWL XL LVL3 (GOWN DISPOSABLE) ×1 IMPLANT
GOWN STRL REUS W/TWL LRG LVL3 (GOWN DISPOSABLE) ×1
GOWN STRL REUS W/TWL XL LVL3 (GOWN DISPOSABLE) ×1
KIT BASIN OR (CUSTOM PROCEDURE TRAY) ×2 IMPLANT
KIT ROOM TURNOVER OR (KITS) ×2 IMPLANT
NEEDLE 22X1 1/2 (OR ONLY) (NEEDLE) ×4 IMPLANT
NEEDLE 27GAX1X1/2 (NEEDLE) IMPLANT
NS IRRIG 1000ML POUR BTL (IV SOLUTION) ×2 IMPLANT
PAD ARMBOARD 7.5X6 YLW CONV (MISCELLANEOUS) ×2 IMPLANT
SUT CHROMIC 3 0 PS 2 (SUTURE) ×2 IMPLANT
SYR CONTROL 10ML LL (SYRINGE) ×2 IMPLANT
TRAY ENT MC OR (CUSTOM PROCEDURE TRAY) ×2 IMPLANT
TUBING IRRIGATION (MISCELLANEOUS) ×2 IMPLANT
YANKAUER SUCT BULB TIP NO VENT (SUCTIONS) ×2 IMPLANT

## 2017-06-18 NOTE — Anesthesia Procedure Notes (Signed)
Procedure Name: Intubation Date/Time: 06/18/2017 9:38 AM Performed by: Freddie Breech, CRNA Pre-anesthesia Checklist: Patient identified, Emergency Drugs available, Suction available and Patient being monitored Patient Re-evaluated:Patient Re-evaluated prior to induction Oxygen Delivery Method: Circle System Utilized Preoxygenation: Pre-oxygenation with 100% oxygen Induction Type: IV induction Ventilation: Mask ventilation without difficulty Laryngoscope Size: 3 and Mac Grade View: Grade I Nasal Tubes: Nasal Rae and Nasal prep performed Tube size: 7.0 mm Number of attempts: 1 Placement Confirmation: ETT inserted through vocal cords under direct vision,  positive ETCO2 and breath sounds checked- equal and bilateral Secured at: 27 cm Tube secured with: Tape Dental Injury: Teeth and Oropharynx as per pre-operative assessment

## 2017-06-18 NOTE — Transfer of Care (Signed)
Immediate Anesthesia Transfer of Care Note  Patient: [name]  Procedure(s) Performed: MULTIPLE EXTRACTION (N/A Mouth)  Patient Location: PACU  Anesthesia Type:General  Level of Consciousness: drowsy and patient cooperative  Airway & Oxygen Therapy: Patient Spontanous Breathing and Patient connected to face mask oxygen  Post-op Assessment: Report given to RN, Post -op Vital signs reviewed and stable and Patient moving all extremities X 4  Post vital signs: Reviewed and stable  Last Vitals:  Vitals:   06/18/17 0728 06/18/17 1025  BP: 139/64   Pulse:  (P) 91  Resp:  (P) 15  Temp:  (!) (P) 36.4 C  SpO2:  (P) 98%    Last Pain:  Vitals:   06/18/17 1025  TempSrc:   PainSc: (P) 0-No pain      Patients Stated Pain Goal: 3 (73/53/29 [id number])  Complications: No apparent anesthesia complications

## 2017-06-18 NOTE — Op Note (Signed)
06/18/2017  10:16 AM  PATIENT:  [name]  67 y.o. female  PRE-OPERATIVE DIAGNOSIS:  non restorable teeth # 3, 6, 7, 8, 9, 10, 11, 12, 15, 18, 31  POST-OPERATIVE DIAGNOSIS:  SAME  PROCEDURE:  Procedure(s): MULTIPLE EXTRACTION teeth # 3, 6, 7, 8, 9, 10, 11, 12, 15, 18, 31; Alveoloplasty right and left maxilla  SURGEON:  Surgeon(s): Diona Browner, DDS  ANESTHESIA:   local and general  EBL:  minimal  DRAINS: none   SPECIMEN:  No Specimen  COUNTS:  YES  PLAN OF CARE: Discharge to home after PACU  PATIENT DISPOSITION:  PACU - hemodynamically stable.   PROCEDURE DETAILS: Dictation # [id number]  Gae Bon, DMD 06/18/2017 10:16 AM

## 2017-06-18 NOTE — H&P (Signed)
H&P documentation  -History and Physical Reviewed  -Patient has been re-examined  -No change in the plan of care  [name]  

## 2017-06-18 NOTE — Anesthesia Postprocedure Evaluation (Signed)
Anesthesia Post Note  Patient: [name]  Procedure(s) Performed: MULTIPLE EXTRACTION (N/A Mouth)     Patient location during evaluation: PACU Anesthesia Type: General Level of consciousness: awake and alert Pain management: pain level controlled Vital Signs Assessment: post-procedure vital signs reviewed and stable Respiratory status: spontaneous breathing, nonlabored ventilation, respiratory function stable and patient connected to nasal cannula oxygen Cardiovascular status: blood pressure returned to baseline and stable Postop Assessment: no apparent nausea or vomiting Anesthetic complications: no    Last Vitals:  Vitals:   06/18/17 1045 06/18/17 1100  BP: (!) 113/55 (!) 112/58  Pulse: 88 87  Resp: 13 13  Temp:    SpO2: 93% 95%    Last Pain:  Vitals:   06/18/17 1100  TempSrc:   PainSc: 0-No pain                 Rox Mcgriff

## 2017-06-18 NOTE — Op Note (Signed)
NAME:  [name], [name]              ACCOUNT NO.:  [id number]  MEDICAL RECORD NO.:  [id number]  LOCATION:                                 FACILITY:  PHYSICIAN:  Gae Bon, M.D.  DATE OF BIRTH:  05-08-1950  DATE OF PROCEDURE:  06/18/2017 DATE OF DISCHARGE:  06/18/2017                              OPERATIVE REPORT   PREOPERATIVE DIAGNOSIS:  Nonrestorable teeth secondary to dental caries, 3, 6, 7, 8, 9, 10, 11, 12, 15, 18, and 31.  POSTOPERATIVE DIAGNOSIS:  Nonrestorable teeth secondary to dental caries, 3, 6, 7, 8, 9, 10, 11, 12, 15, 18, and 31.  PROCEDURE:  Extraction of teeth numbers 3, 6, 7, 8, 9, 10, 11, 12, 15, 18, 31; alveoplasty of right and left maxilla.  SURGEON:  Gae Bon, MD.  ANESTHESIA:  General, nasal intubation.  DESCRIPTION OF PROCEDURE:  The patient was taken to the operating room and placed on the table in supine position.  General anesthesia was administered and a nasal endotracheal tube was placed and secured.  The eyes were protected and the patient was draped for surgery.  Time-out was performed.  The posterior pharynx was suctioned.  A throat pack was placed.  2% lidocaine with 1:100,000 epinephrine was infiltrated in an inferior alveolar block on the right and left sides and buccal and palatal infiltration around the maxillary teeth to be removed.  A bite block was placed on the right side of the mouth and a sweetheart retractor was used to retract the tongue, and a #15 blade was used to make an incision circumferentially around tooth #18 and then in the maxilla.  The incision was made proximal to tooth #15 along the alveolar crest and then carried along the alveolar crest to tooth #12 where it went circumferentially in the gingival sulcus around teeth numbers 12, 11, 10, 9, 8, and 7.  The periosteum was reflected from around these teeth.  The tissue was trimmed for closure and then the teeth were elevated with a 301 elevator and removed from  the mouth with a dental forceps.  The sockets were curetted, irrigated and then the periosteum was reflected in the maxilla to expose the alveolar crest from molar to midline.  Then, the egg-shaped bur and bone file were used to perform an alveoplasty to smooth out any irregularities.  The area was irrigated and closed with 3-0 chromic.  The bite block and sweetheart retractor were repositioned to the other side of the mouth.  A 15 blade was used to make incision around teeth numbers 3, 6, and 31.  The incision at #3 was carried along the alveolar crest to tooth #6 and then the periosteum was reflected from around these teeth.  The teeth were elevated and then tooth #31 was removed with the dental forceps and then tooth #3 fractured upon attempted removal as did #6 when the forceps were used. The periosteum was reflected to expose the tissue around the tooth and the alveolar crest and then bone was removed around the roots of tooth #3 and they were removed with root tip pick and rongeurs after removing bone around tooth #6.  The tooth was elevated and  removed with a 301 elevator.  Then, the sockets were irrigated.  The alveoplasty was performed using the egg-shaped bur and bone file and then the area was closed with 3-0 chromic.  Then, the oral cavity was irrigated and suctioned.  The throat pack was removed.  The patient was left in the care of Anesthesia for awakening, extubation and transport to recovery room for plans for discharge home through Day Surgery.  ESTIMATED BLOOD LOSS:  Minimum.  COMPLICATIONS:  None.  SPECIMENS:  None.     Gae Bon, M.D.     SMJ/MEDQ  D:  06/18/2017  T:  06/18/2017  Job:  [id number]

## 2017-06-19 ENCOUNTER — Emergency Department (HOSPITAL_COMMUNITY)
Admission: EM | Admit: 2017-06-19 | Discharge: 2017-06-19 | Disposition: A | Discharge status: Home / Self Care | Payer: Medicare Other | Attending: Emergency Medicine | Admitting: Emergency Medicine

## 2017-06-19 ENCOUNTER — Encounter (HOSPITAL_COMMUNITY): Payer: Self-pay | Admitting: Oral Surgery

## 2017-06-19 DIAGNOSIS — E119 Type 2 diabetes mellitus without complications: Secondary | ICD-10-CM | POA: Diagnosis not present

## 2017-06-19 DIAGNOSIS — E039 Hypothyroidism, unspecified: Secondary | ICD-10-CM | POA: Diagnosis not present

## 2017-06-19 DIAGNOSIS — J45909 Unspecified asthma, uncomplicated: Secondary | ICD-10-CM | POA: Diagnosis not present

## 2017-06-19 DIAGNOSIS — Z91040 Latex allergy status: Secondary | ICD-10-CM | POA: Insufficient documentation

## 2017-06-19 DIAGNOSIS — G8918 Other acute postprocedural pain: Secondary | ICD-10-CM | POA: Diagnosis not present

## 2017-06-19 DIAGNOSIS — Z7982 Long term (current) use of aspirin: Secondary | ICD-10-CM | POA: Insufficient documentation

## 2017-06-19 DIAGNOSIS — J449 Chronic obstructive pulmonary disease, unspecified: Secondary | ICD-10-CM | POA: Diagnosis not present

## 2017-06-19 DIAGNOSIS — Z87891 Personal history of nicotine dependence: Secondary | ICD-10-CM | POA: Diagnosis not present

## 2017-06-19 DIAGNOSIS — I10 Essential (primary) hypertension: Secondary | ICD-10-CM | POA: Insufficient documentation

## 2017-06-19 DIAGNOSIS — Z794 Long term (current) use of insulin: Secondary | ICD-10-CM | POA: Insufficient documentation

## 2017-06-19 DIAGNOSIS — Z79899 Other long term (current) drug therapy: Secondary | ICD-10-CM | POA: Insufficient documentation

## 2017-06-19 HISTORY — DX: Bipolar disorder, unspecified: F31.9

## 2017-06-19 LAB — I-STAT CHEM 8, ED
BUN: 8 mg/dL (ref 6–20)
Calcium, Ion: 1.15 mmol/L (ref 1.15–1.40)
Chloride: 96 mmol/L — ABNORMAL LOW (ref 101–111)
Creatinine, Ser: 0.5 mg/dL (ref 0.44–1.00)
Glucose, Bld: 88 mg/dL (ref 65–99)
HCT: 33 % — ABNORMAL LOW (ref 36.0–46.0)
Hemoglobin: 11.2 g/dL — ABNORMAL LOW (ref 12.0–15.0)
Potassium: 4 mmol/L (ref 3.5–5.1)
Sodium: 132 mmol/L — ABNORMAL LOW (ref 135–145)
TCO2: 25 mmol/L (ref 22–32)

## 2017-06-19 MED ORDER — HYDROCODONE-ACETAMINOPHEN 5-325 MG PO TABS
2.0000 | ORAL_TABLET | Freq: Once | ORAL | Status: AC
  Administered 2017-06-19: 2 via ORAL
  Filled 2017-06-19: qty 2

## 2017-06-19 MED ORDER — HYDROCODONE-ACETAMINOPHEN 5-325 MG PO TABS: 1.0000 | ORAL_TABLET | Freq: Four times a day (QID) | ORAL | 0 refills | Status: AC | PRN

## 2017-06-19 NOTE — ED Triage Notes (Signed)
Pt reports she had several teeth pulled yesterday and is concerned because she bled all night and has bruising to her cheeks.  No bleeding at this time.

## 2017-06-19 NOTE — ED Provider Notes (Signed)
Emergency Department Provider Note   I have reviewed the triage vital signs and the nursing notes.   HISTORY  Chief Complaint bleeding post op   HPI [name] is a 67 y.o. female with multiple problems as documented below presents to the emergency department today with postop pain and bleeding.  Patient had multiple teeth extracted yesterday by dentistry at [facility].  Op note says that some lidocaine was placed and postop bleeding was minimal without any complications.  Patient states that throughout the night she had quite a bit of oozing from all of her gums and she would cough and cough up blood also with blood coming out of her nose.  She had worsening pain to her bilateral maxillary sinuses and jaw.  She started noticing some bruising today it became tender to touch however the bleeding slowed down so she came here for further evaluation from the assisted living facility.  She is on Vicodin at home for back issues.  No trouble opening her jaw.  She has been eating soft foods today.  No fevers.  Bleeding has stopped at this point. No other associated or modifying symptoms.    Past Medical History:  Diagnosis Date  . Allergic rhinitis    uses Nasonex daily as needed  . Allergy to angiotensin receptor blockers (ARB) 01/22/2014   Angioedema  . Anxiety    takes Ativan daily as needed  . Aspiration pneumonia ([place])   . Asthma    Albuterol inhaler and Neb daily as needed  . 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 [name], residual palpitations  . Bipolar 1 disorder ([place])   . Chronic back pain    reason unknown  . Constipation   . COPD (chronic obstructive pulmonary disease) ([place])   . DDD (degenerative disc disease), lumbar   . Depression    takes Zoloft daily as well as Cymbalta  . Diabetes mellitus    takes NOvolin daily  . Dysphagia   . Dysrhythmia    SVT ==ablation done by Dr. Rayann Heman 2007  . Esophageal reflux    takes Zantac daily  . History of bronchitis Dec 2014  . Hypertension    takes Losartan daily  . Hypothyroidism    takes Synthroid daily  . Joint pain   . Joint swelling    left ankle  . Low sodium syndrome   . Lumbar radiculopathy   . Seizures ([place]) 04-05-13   one time ran out of Primidone and had stopped it cold [place]  . Tremor, essential    takes Mysoline and Neurontin daily.  . Vocal cord dysfunction   . Weakness    in left arm    Patient Active Problem List   Diagnosis Date Noted  . Constipation 03/18/2017  . CAP (community acquired pneumonia) 04/21/2016  . History of colonic polyps   . Diverticulosis of colon without hemorrhage   . Other hemorrhoids   . [name]'s ring   . Mucosal abnormality of stomach   . Encounter for screening colonoscopy 02/21/2015  . Upper airway cough syndrome 01/18/2015  . OAB (overactive bladder) 12/30/2014  . Incontinence in female 12/25/2014  . MDD (major depressive disorder), recurrent severe, without psychosis ([place]) 12/14/2014  . Thrush of mouth and esophagus ([place]) 11/23/2014  . Depression with somatization 11/05/2014  . Type II diabetes mellitus with manifestations ([place]) 10/23/2014  . Visit for screening mammogram 08/21/2014  . Hypokalemia 05/30/2014  . Tinea  corporis 05/30/2014  . Lumbar scoliosis 04/24/2014  . Obstructive sleep apnea 04/23/2014  . Allergy to angiotensin receptor blockers (ARB) 01/22/2014  . S/P deep brain stimulator placement 01/10/2014  . Essential tremor 12/04/2013  . Greater trochanteric bursitis of left hip 11/07/2013  . Spinal stenosis of lumbar region 10/30/2013  . Right thyroid nodule 10/12/2013  . CMC arthritis, thumb, degenerative 06/15/2013  . IBS (irritable bowel syndrome) 11/03/2012  . Esophageal stenosis 11/01/2012  . Asthma, moderate persistent, poorly-controlled 09/01/2012  . Hypothyroidism 12/22/2011  . Hyperlipidemia with target LDL less than 100 06/22/2008  . Essential hypertension  09/04/2007  . TREMOR, ESSENTIAL 04/19/2007  . Seasonal and perennial allergic rhinitis 04/19/2007  . Esophageal reflux 04/19/2007    Past Surgical History:  Procedure Laterality Date  . ABLATION FOR AVNRT    . BARTHOLIN GLAND CYST EXCISION     x 2  . BIOPSY  03/04/2015   Procedure: BIOPSY (Duodenal, Gastric);  Surgeon: Daneil Dolin, MD;  Location: [facility];  Service: Endoscopy;;  . CESAREAN SECTION    . CHOLECYSTECTOMY    . COLONOSCOPY  01/16/2004   UXL:KGMWNU rectum/colon  . COLONOSCOPY WITH PROPOFOL N/A 03/04/2015   Dr.[name]- internal hemorrhoids, pancolonic diverticulosis, colonic polyp= tubular adenoma  . ESOPHAGEAL DILATION N/A 03/04/2015   Procedure: ESOPHAGEAL DILATION WITH 54FR MALONEY DILATOR;  Surgeon: Daneil Dolin, MD;  Location: [facility];  Service: Endoscopy;  Laterality: N/A;  . ESOPHAGOGASTRODUODENOSCOPY (EGD) WITH ESOPHAGEAL DILATION  04/03/2002   UVO:[id number] ring, otherwise normal esophagus, status post dilation with 56 F/Normal stomach  . ESOPHAGOGASTRODUODENOSCOPY (EGD) WITH ESOPHAGEAL DILATION N/A 11/08/2012   Dr. [name]:schatzki's ring s/p dilation/hiatal hernia  . ESOPHAGOGASTRODUODENOSCOPY (EGD) WITH PROPOFOL N/A 03/04/2015   Dr.[name]- schatzki's ring, hiatal hernia, scattered erosions bx= chronic inactive gastritis  . ESOPHAGOGASTRODUODENOSCOPY (EGD) WITH PROPOFOL N/A 05/06/2017   Procedure: ESOPHAGOGASTRODUODENOSCOPY (EGD) WITH PROPOFOL;  Surgeon: Daneil Dolin, MD;  [facility];  Service: Endoscopy;  Laterality: N/A;  10:15am  . HEMORRHOID BANDING  2017   Dr.[name]  . LEFT ANKLE LIGAMENT REPAIR     x 2  . LEFT ELBOW REPAIR    . MALONEY DILATION N/A 05/06/2017   Procedure: Venia Minks DILATION;  Surgeon: Daneil Dolin, MD;  [facility];  Service: Endoscopy;  Laterality: N/A;  . MAXIMUM ACCESS (MAS)POSTERIOR LUMBAR INTERBODY FUSION (PLIF) 1 LEVEL N/A 04/24/2014   Procedure: Lumbar four-five Maximum access Surgery posterior lumbar  interbody fusion;  Surgeon: [name], MD;  Location: [place] [facility];  Service: Neurosurgery;  Laterality: N/A;  Lumbar four-five Maximum access Surgery posterior lumbar interbody fusion  . MULTIPLE EXTRACTIONS WITH ALVEOLOPLASTY N/A 06/18/2017   Procedure: MULTIPLE EXTRACTION;  Surgeon: Diona Browner, DDS;  Location: [place];  Service: Oral Surgery;  Laterality: N/A;  . PARTIAL HYSTERECTOMY    . POLYPECTOMY  03/04/2015   Procedure: POLYPECTOMY (descending colon);  Surgeon: Daneil Dolin, MD;  Location: [facility];  Service: Endoscopy;;  . PULSE GENERATOR IMPLANT Bilateral 12/15/2013   Procedure: BILATERAL PULSE GENERATOR IMPLANT;  Surgeon: [name], MD;  Location: [place] [facility];  Service: Neurosurgery;  Laterality: Bilateral;  BILATERAL  . RIGHT BREAST CYST     benign  . SUBTHALAMIC STIMULATOR INSERTION Bilateral 12/04/2013   Procedure: Bilateral Deep brain stimulator placement;  Surgeon: [name], MD;  Location: [place] [facility];  Service: Neurosurgery;  Laterality: Bilateral;  Bilateral Deep brain stimulator placement  . TONSILLECTOMY      Current Outpatient Rx  . Order #:  [id number] Class: Historical Med  . Order #: [id number] Class: Print  . Order #: [id number] Class: Historical Med  . Order #: [id number] Class: Print  . Order #: [id number] Class: Historical Med  . Order #: [id number] Class: Historical Med  . Order #: [id number] Class: Historical Med  . Order #: [id number] Class: Historical Med  . Order #: [id number] Class: Normal  . Order #: [id number] Class: Historical Med  . Order #: [id number] Class: Historical Med  . Order #: [id number] Class: Print  . Order #: [id number] Class: Normal  . Order #: [id number] Class: Historical Med  . Order #: [id number] Class: Print  . Order #: [id number] Class: Normal  . Order #: [id number] Class: Historical Med  . Order #: [id number] Class: Historical Med  . Order #: [id number] Class: Historical Med  . Order #: [id number] Class: Historical Med  . Order #: [id number] Class:  Historical Med  . Order #: [id number] Class: Historical Med  . Order #: [id number] Class: Normal  . Order #: [id number] Class: Historical Med  . Order #: [id number] Class: Historical Med  . Order #: [id number] Class: Normal  . Order #: [id number] Class: Print  . Order #: [id number] Class: Historical Med  . Order #: [id number] Class: Historical Med  . Order #: [id number] Class: Historical Med  . Order #: [id number] Class: Normal  . Order #: [id number] Class: Historical Med  . Order #: [id number] Class: Historical Med  . Order #: [id number] Class: Normal  . Order #: [id number] Class: Historical Med  . Order #: [id number] Class: Historical Med  . Order #: [id number] Class: Historical Med    Allergies Invokana [canagliflozin]; Losartan; Metformin and related; Latex; Other; Oxycodone-acetaminophen; and Mucinex [guaifenesin er]  Family History  Problem Relation Age of Onset  . Lung cancer Father        DIED AGE 65 LUNG CA  . Alcohol abuse Father   . Anxiety disorder Father   . Depression Father   . COPD Mother        DIED AGE 41,MRSA,COPD,PNEUMONIA  . Pneumonia Mother        DIED AGE 41,MRSA,COPD,PNEUMONIA  . Supraventricular tachycardia Mother   . Anxiety disorder Mother   . Depression Mother   . Alcohol abuse Mother   . Heart disease Sister        DIED AGE 11, SMOKER,?HEART  . COPD Brother        AGE 64  . Colon cancer Neg Hx     Social History Social History   Tobacco Use  . Smoking status: Former Smoker    Packs/day: 0.30    Years: 10.00    Pack years: 3.00    Types: Cigarettes    Last attempt to quit: 04/23/1993    Years since quitting: 24.1  . Smokeless tobacco: Never Used  . Tobacco comment: quit smoking 25+yrs ago  Substance Use Topics  . Alcohol use: No    Alcohol/week: 0.0 oz  . Drug use: No    Review of Systems  All other systems negative except as documented in the HPI. All pertinent positives and negatives as reviewed in the  HPI. ____________________________________________   PHYSICAL EXAM:  VITAL SIGNS: ED Triage Vitals  Enc Vitals Group     BP 06/19/17 1528 (!) 115/93     Pulse Rate 06/19/17 1528 85     Resp 06/19/17 1528 20     Temp 06/19/17 1528 98.3 F (36.8 C)     Temp Source 06/19/17 1528 Oral     SpO2 06/19/17 1528 100 %     Weight 06/19/17 1526 182 lb (82.6 kg)     Height 06/19/17 1526 5\' 4"  (1.626 m)  Constitutional: Alert and oriented. Well appearing and in no acute distress. Eyes: Conjunctivae are normal. PERRL. EOMI. Head: Atraumatic. Nose: No congestion/rhinnorhea. Mouth/Throat: Mucous membranes are moist.  Oropharynx with erythema. Multiple empty sockets hemostatic with clot in place. Has ecchymosis on both buccal services. Mild ttp and induration to bilateral mandibles. Opens mouth fully to cooperate with exam. No instability of midface or mandible.  Neck: No stridor.  No meningeal signs.   Cardiovascular: Normal rate, regular rhythm. Good peripheral circulation. Grossly normal heart sounds.   Respiratory: Normal respiratory effort.  No retractions. Lungs CTAB. Gastrointestinal: Soft and nontender. No distention.  Musculoskeletal: No lower extremity tenderness nor edema. No gross deformities of extremities. Neurologic:  Normal speech and language. No gross focal neurologic deficits are appreciated.  Skin:  Skin is warm, dry and intact. No rash noted.  ____________________________________________   LABS (all labs ordered are listed, but only abnormal results are displayed)  Labs Reviewed  I-STAT CHEM 8, ED - Abnormal; Notable for the following components:      Result Value   Sodium 132 (*)    Chloride 96 (*)    Hemoglobin 11.2 (*)    HCT 33.0 (*)    All other components within normal limits   ____________________________________________  PROCEDURES  Procedure(s) performed:   Procedures   ____________________________________________   INITIAL IMPRESSION /  ASSESSMENT AND PLAN / ED COURSE  Suspect normal bleeding and normal adverse effect of the surgery however she states she did bleed all night so we will check a i-STAT hemoglobin to ensure its reasonable.  We will also give her a dose of pain medicine.  Low suspicion for any complications from the surgery such as fracture at this point.  Hemoglobin slightly low but has stopped bleeding for multiple hours, doubt she will go into hemmorhagic shock. Stable for dc at this time.   Will increase pain meds for next couple days. Encouraged salt water gargles and dentistry follow up. Return here for change/worsening in symptomns.  Pertinent labs & imaging results that were available during my care of the patient were reviewed by me and considered in my medical decision making (see chart for details).  ____________________________________________  FINAL CLINICAL IMPRESSION(S) / ED DIAGNOSES  Final diagnoses:  Post-operative pain     MEDICATIONS GIVEN DURING THIS VISIT:  Medications  HYDROcodone-acetaminophen (NORCO/VICODIN) 5-325 MG per tablet 2 tablet (2 tablets Oral Given 06/19/17 1610)     NEW OUTPATIENT MEDICATIONS STARTED DURING THIS VISIT:  New Prescriptions   HYDROCODONE-ACETAMINOPHEN (NORCO/VICODIN) 5-325 MG TABLET    Take 1-2 tablets by mouth every 6 (six) hours as needed for up to 3 days.    Note:  This note was prepared with assistance of Dragon voice recognition software. Occasional wrong-word or sound-a-like substitutions may have occurred due to the inherent limitations of voice recognition software.   Merrily Pew, MD 06/19/17 [phone]

## 2017-06-19 NOTE — ED Notes (Signed)
Call to [facility], [place]. Staff "Tim Lair"  stated they will try to send someone to pick patient up soon.

## 2017-06-19 NOTE — ED Notes (Signed)
After call by CN to [facility], pt is discharged   To [facility] per [facility] request

## 2017-06-30 ENCOUNTER — Other Ambulatory Visit: Payer: Self-pay

## 2017-06-30 MED ORDER — LUBIPROSTONE 24 MCG PO CAPS: 24.0000 ug | ORAL_CAPSULE | Freq: Two times a day (BID) | ORAL | 3 refills | Status: DC

## 2017-07-02 ENCOUNTER — Telehealth: Payer: Self-pay | Admitting: Internal Medicine

## 2017-07-02 NOTE — Telephone Encounter (Signed)
Attempted to call pt but no answer.   Left message for pt to return our call x1 

## 2017-07-05 NOTE — Telephone Encounter (Signed)
Called and spoke with patient. Patient stated the her runny nose is better. Patient stated she is using her nasal spray and taking benadryl as prescribed.  Nothing further is needed at this time.

## 2017-07-09 ENCOUNTER — Ambulatory Visit (INDEPENDENT_AMBULATORY_CARE_PROVIDER_SITE_OTHER): Payer: Medicare Other | Admitting: Internal Medicine

## 2017-07-09 ENCOUNTER — Encounter: Payer: Self-pay | Admitting: Internal Medicine

## 2017-07-09 DIAGNOSIS — J3089 Other allergic rhinitis: Secondary | ICD-10-CM | POA: Diagnosis not present

## 2017-07-09 DIAGNOSIS — J302 Other seasonal allergic rhinitis: Secondary | ICD-10-CM

## 2017-07-09 DIAGNOSIS — J4540 Moderate persistent asthma, uncomplicated: Secondary | ICD-10-CM | POA: Diagnosis not present

## 2017-07-09 NOTE — Patient Instructions (Signed)
We can continue current meds  Please call if we can help 

## 2017-07-09 NOTE — Progress Notes (Signed)
Patient ID: [name], female    DOB: [birth year], 67 y.o.   MRN: [id number]  HPI  female former smoker followed for Allergic rhinitis, Asthma, complicated by anxiety/VCD, CAD, GERD, DM2, tachycardia, tremor/deep brain stimulator, HBP, now living in assisted living Allergy Profile 01/10/2013-total IgE 166 with significant elevations for most common allergens except molds She had been on allergy vaccine years ago. Office Spirometry 12/13/2014-mild obstruction, mild restriction of forced vital capacity Upper endoscopy 2016-[name]'s ring and gastric erosions ----------------------------------------------------------  05/28/17-  67 year old female former smoker followed for Allergic rhinitis, Asthma, complicated by anxiety/VCD, CAD, GERD, DM, tachycardia, tremor/deep brain stimulator, HBP, now living in assisted living She reports some asthma earlier this winter, then had done very well in the last 3 weeks but feels a little tighter today.  Some bothersome postnasal drainage with no obvious infection.  Recent dental amoxicillin and she is pending more extractions. Daily Breo 100. Uses neb and rescue inhaler only occasionally. CXR 03/15/17- 1V, [place]- report only- NAD  07/09/17-  67 year old female former smoker followed for Allergic rhinitis, Asthma, complicated by anxiety/VCD, CAD, GERD, DM, tachycardia, tremor/deep brain stimulator, HBP, now living in assisted living ----asthma- few attacks this week, inhaler is helping,  Breo 100, Neb albuterol, albuterol hfa Occasional wheeze.  Uses rescue inhaler.  No recent need for nebulizer.  Bothersome clear nasal drainage.  Uses nasal spray occasionally but does not know what it is called.  Denies fever, purulent or bloody discharge.  Usually sleeps undisturbed by her breathing. Occasional twinges right anterior lower chest wall unrelated to food and more common when sitting.  She does not know how long it has been going on but not  progressive.  ROS-see HPI    "+" = pos Constitutional:    weight loss, night sweats, fevers, chills, fatigue, lassitude. HEENT:    headaches, difficulty swallowing, tooth/dental problems, sore throat,       sneezing, itching, ear ache, nasal congestion, +post nasal drip, snoring CV:    chest pain, orthopnea, PND, swelling in lower extremities, anasarca,                                                  dizziness, palpitations Resp:   +shortness of breath with exertion or at rest.                productive cough,    non-productive cough, coughing up of blood.              change in color of mucus.  +wheezing.   Skin:    rash or lesions. GI:  + heartburn, indigestion, abdominal pain, nausea, vomiting, GU:  MS:   joint pain, stiffness, decreased range of motion, back pain. Neuro-    + chronic tremor, +unsteady/ walker Psych:  +change in mood or affect.  +depression or anxiety.   memory loss.  OBJ- Physical Exam General- Alert, Oriented, Affect-appropriate, Distress- none acute, talkative,+obese, + rolling walker Skin- rash-none, lesions- none, excoriation- none Lymphadenopathy- none Head- atraumatic            Eyes- Gross vision intact, PERRLA, conjunctivae and secretions clear            Ears- Hearing, canals-normal            Nose- Clear, no-Septal dev, mucus, polyps, erosion, perforation  Throat- Mallampati II , mucosa -tongue slightly coated , drainage- none, tonsils- atrophic,        Neck- flexible , trachea midline, no stridor , thyroid nl, carotid no bruit Chest - symmetrical excursion , unlabored           Heart/CV- RRR , no murmur , no gallop  , no rub, nl s1 s2                           - JVD- none , edema- none, stasis changes- none, varices- none           Lung- wheeze -none., cough-none , dullness-none, rub- none, rhonchi-none           Chest wall-+ pacemaker left Abd-  Br/ Gen/ Rectal- Not done, not indicated Extrem- cyanosis- none, clubbing, none, atrophy-  none, strength- nl, + rolling walker Neuro- + tremor and head bob,+ mild dysphonia, + unsteady

## 2017-07-09 NOTE — Assessment & Plan Note (Signed)
Better control recently.  She refers to "asthma attacks" but usually means she just noted a little wheeziness and used her inhaler. We discussed her medications without changes to recommend.

## 2017-07-09 NOTE — Assessment & Plan Note (Signed)
Clear postnasal drainage without symptoms suggesting sinusitis.  She does not know what prescription nasal spray she is using but says it seems to help enough.

## 2017-07-13 NOTE — Progress Notes (Signed)
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 Father  Deceased       lung cancer, diabetes  . Mother  Deceased       COPD, diabetes  . Son  Deceased at age 72       brain tumor (neuroblastoma)  . Sister  Deceased       blood clot  . Daughter  Alive       healthy  . Brother  (Not Specified)  . Neg Hx  (Not Specified)    Review of Systems A complete 10 system ROS was obtained and was negative apart from what is mentioned.   Objective:   VITALS:   Vitals:   07/15/17 1417  BP: 112/60  Pulse: 78  SpO2: 97%   Wt Readings from Last 3 Encounters:  07/09/17 180 lb 12.8 oz (82 kg)  06/19/17 182 lb (82.6 kg)  06/18/17 182 lb (82.6 kg)      Gen: Appears stated age and in NAD.   She speaks about herself in the 3rd person.   HEENT: Normocephalic.  Scalp incisions are well healed.  The mucous membranes are moist. The superficial temporal arteries are without ropiness or tenderness.    Cardiovascular: Regular rate and rhythm.  Lungs: clear today bilaterally Neck: There are  no carotid bruits noted bilaterally.  MS:  Hand in bandage due to recent surgery  NEUROLOGICAL:  Orientation: The patient is alert and oriented x 3.  Cranial nerves: There is good facial symmetry.  Speech is fluent and clear but has a pseudobulbar quality that is worse than before (just had all upper teeth pulled). Soft palate rises symmetrically and there is no tongue deviation. Hearing is intact to conversational tone.  Tone: Tone is  good throughout.  Sensation: Sensation is intact to light touch throughout. Coordination: The patient has no dysdiadichokinesia.  She has no postural tremor.  There is some head tremor.  She has ataxic component to tremor bilaterally, L more than R.   Motor: Strength is 5/5 in the UE.   Gait and Station: The patient is in her WC and opted not to ambulate today   MOVEMENT EXAM:  Tremor: as above, no postural tremor.  Some ataxic tremor.    DBS programming was performed today which is described in more detail in a separate programming procedural notes.     Assessment/Plan:   1.  Essential Tremor.  -This is evidenced by the symmetrical nature and longstanding hx of gradually getting worse.  -The patient is status post bilateral VIM DBS on 12/04/2013 with bilateral generator placement on 12/15/2013.  Postoperative imaging indicates that leads are perhaps just slightly too lateral and more inferior than anticipated.  -DBS device was programmed again today.    I had a long discussion with the patient today and ultimately we decided to go ahead and turn her device off.  I explained to her that the device can affect mood and balance.  I think she has many other reasons to have balance issues (peripheral neuropathy, radiculopathy, pain medications) but I am more concerned about the mood.  She was agreeable to letting me turn the device off on both sides for the next 4-5 weeks.  -I had previously written orders to decrease her primidone to 50 mg twice per day.  She returns today on 150 mg 3 times per day.  I am not exactly sure how that happened, nor is the patient.  I did not change it today because I turned the device off. 2.  Diabetic peripheral neuropathy  -on gabapentin with pain management 3.  L4 radiculopathy  -S/P surgery and back pain was better but now has chronic pain.  She is seeing pain management and is getting injections and on both oxycontin and vicodin.  These are contributing to walking  issues and she needs to continue using walker at ALL times.  Only walker related to not using it. 4.  F/u in 4-5 weeks.

## 2017-07-15 ENCOUNTER — Encounter: Payer: Self-pay | Admitting: Neurology

## 2017-07-15 ENCOUNTER — Ambulatory Visit (INDEPENDENT_AMBULATORY_CARE_PROVIDER_SITE_OTHER): Payer: Medicare Other | Admitting: Neurology

## 2017-07-15 VITALS — BP 112/60 | HR 78

## 2017-07-15 DIAGNOSIS — E1142 Type 2 diabetes mellitus with diabetic polyneuropathy: Secondary | ICD-10-CM | POA: Diagnosis not present

## 2017-07-15 DIAGNOSIS — G250 Essential tremor: Secondary | ICD-10-CM | POA: Diagnosis not present

## 2017-07-15 DIAGNOSIS — F319 Bipolar disorder, unspecified: Secondary | ICD-10-CM | POA: Diagnosis not present

## 2017-07-15 NOTE — Patient Instructions (Signed)
Keep track of changes in your mood, tremor, and balance. We will see you back on May 17th.

## 2017-07-15 NOTE — Procedures (Signed)
DBS Programming was performed.    Total time spent programming was 18 minutes.  Device was confirmed to be on.  Soft start was confirmed to be on.  Impedences were checked and were within normal limits.  Battery was checked and was determined to be functioning normally and not near the end of life (2.88 and 2.84).  However, interrogation revealed that in February, device was only on 73% of the time.  This was true for both sides.  Patient's device was turned on and off independently on both sides.  There was not significant return of tremor, although some did return.  Ultimately, after some discussion and trial and error with the device on and off, we decided to leave the device off for the next 4-5 weeks.  I explained to her that the device can not only affect tremor, but it also can affect mood and balance and I am concerned about this.  She was agreeable to leaving it off.  Final settings were as follows, although the device was off:  Left brain electrode:     2-1+  ; Amplitude  3.0   V   ; Pulse width 90 microseconds;   Frequency   130   Hz.  Right brain electrode:     3-2+          ; Amplitude   3.6  V ;  Pulse width 90  microseconds;  Frequency   140    Hz.

## 2017-08-17 ENCOUNTER — Telehealth: Payer: Self-pay | Admitting: Neurology

## 2017-08-17 NOTE — Progress Notes (Signed)
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 Not on file    Relationship status: Not on file  . Intimate partner violence:    Fear of current or ex partner: Not on file    Emotionally abused: Not on file    Physically abused: Not on file    Forced sexual activity: Not on file  Other Topics Concern  . Not on file  Social History Narrative   Married, 1 daughte, living.  Lives with husband in [place], [place].   1 child died brain tumor at age 72.   Two grandchildren.   Works at [employer], patient currently lost her job.   Retired 2011.   No tobacco.   Alcohol: none in 30 yrs.  Distant history of heavy use.   No drug use.    Family Status  Relation Name Status  . Father  Deceased       lung cancer, diabetes  . Mother  Deceased       COPD, diabetes  . Son  Deceased at age 67       brain tumor (neuroblastoma)  . Sister  Deceased       blood clot  . Daughter  Alive       healthy  . Brother  (Not Specified)  . Neg Hx  (Not Specified)    Review of Systems A complete 10 system ROS was obtained and was negative apart from what is mentioned.   Objective:   VITALS:  Vitals:   08/20/17 1325  BP: 112/62  Pulse: 86  SpO2: 98%   Wt Readings from Last 3 Encounters:  07/09/17 180 lb 12.8 oz (82 kg)  06/19/17 182 lb (82.6 kg)  06/18/17 182 lb (82.6 kg)      Gen: Appears stated age and in NAD.   HEENT: Normocephalic, AT.  MMM.  She is edentulous.   Cardiovascular: Regular rate and rhythm.  Lungs: clear today bilaterally Neck: There are no  carotid bruits noted bilaterally.   NEUROLOGICAL:  Orientation: The patient is alert and oriented x 3.  Cranial nerves: There is good facial symmetry.  Speech is fluent and clear but has a pseudobulbar quality. Soft palate rises symmetrically and there is no tongue deviation. Hearing is intact to conversational tone.  Tone: Tone is good throughout.  Sensation: Sensation is intact to light touch throughout. Coordination: The patient has no dysdiadichokinesia.   Motor: Strength is 5/5 in the UE.   Gait and Station: The patient is in her WC and opted not to ambulate today   MOVEMENT EXAM:  Tremor: When I walked into the room, her device was off and she was holding a magazine, and there was fairly moderate tremor on both hands.  DBS programming was performed today which is described in more detail in a separate programming procedural notes.  In brief, tremor was completely gone on the left hand after turning the device check on.  I did increase the voltage on the left brain (right body) and this did help tremor.  However, she had a little bit of myoclonus noted as well as some ataxic tremor.   Assessment/Plan:   1.  Essential Tremor.  -This is evidenced by the symmetrical nature and longstanding hx of gradually getting worse.  -The patient is status post bilateral VIM DBS on 12/04/2013 with bilateral generator placement on 12/15/2013.  Postoperative imaging indicates that leads are perhaps just slightly too lateral and more inferior than anticipated.  -I was worried that the device was contributing to mood change.  However, the device was turned off for 5 weeks and the facility reports that her mood was not only better, but that it was worse.  In addition, tremor was markedly worse and she had difficulty taking medication with water because of this.  I turned back on the device today and made adjustments, as were the programming procedure note.   -I had previously written orders to decrease her  primidone to 50 mg twice per day.  She returns today on 150 mg 3 times per day.  I am not exactly sure how that happened, nor is the patient.  I wrote a note to the facility asking them to not change this medication without my knowledge.  I decreased the medication today over 5 weeks so that she will ultimately be taking primidone, 50 mg, 2 tablets in the morning and 1 at night.  -she does have mild myoclonus now and discussed that this is likely from her gabapentin.  Discussed this with her today. 2.  Diabetic peripheral neuropathy  -on gabapentin with pain management.  Feels that neuropathy is getting worse and told her to discuss with pain management. 3.  L4 radiculopathy  -S/P surgery and back pain was better but now has chronic pain.  She is seeing pain management and is getting injections and on both oxycontin and vicodin.  These are contributing to walking issues and she needs to continue using walker at ALL times.   4.  I will see the patient back in 4 months, sooner should new neurologic issues arise.

## 2017-08-17 NOTE — Telephone Encounter (Signed)
[name], will you call group facility where pt lives and find out if any difference in mood and/or tremor since device turned off

## 2017-08-18 NOTE — Telephone Encounter (Signed)
[facility] at [phone] to inquire on patient's mood and tremors since last visit.  Spoke with Claiborne Billings who is the Retail buyer and she states that patient's mood has definitely been worse. She has been complaining daily about minor things (example- a stem was still on a carrot that was served to her) and she has been picking fights with people, cursing at them, etc. She states this has been worse in the last couple of weeks.  She transferred me to the med tech to ask about tremors. Med tech states tremors have been worse. When they give her water to take her meds she is spilling it everywhere and can barely hold it.   [name] - fyi.

## 2017-08-20 ENCOUNTER — Ambulatory Visit (INDEPENDENT_AMBULATORY_CARE_PROVIDER_SITE_OTHER): Payer: Medicare Other | Admitting: Neurology

## 2017-08-20 ENCOUNTER — Encounter: Payer: Self-pay | Admitting: Neurology

## 2017-08-20 VITALS — BP 112/62 | HR 86

## 2017-08-20 DIAGNOSIS — G250 Essential tremor: Secondary | ICD-10-CM

## 2017-08-20 NOTE — Procedures (Signed)
DBS Programming was performed.    Total time spent programming was 10 minutes.  Device was confirmed to be OFF.  Device was turned on .  Soft start was confirmed to be on.  Impedences were checked and were within normal limits.  Battery was checked and was determined to be functioning normally and not near the end of life (2.89 and 2.87).    Final settings were as follows, although the device was off:  Left brain electrode:     2-1+  ; Amplitude  3.2   V   ; Pulse width 90 microseconds;   Frequency   130   Hz.  Right brain electrode:     3-2+          ; Amplitude   3.6  V ;  Pulse width 90  microseconds;  Frequency   140    Hz.

## 2017-08-20 NOTE — Patient Instructions (Signed)
Decrease primidone to 50 mg: 2 tablets 3 times per day for 2 weeks, then 2 tablets twice per day for 2 weeks and then 2 tablets in the morning and 1 tablet at night thereafter  Please only allow me to manage the primidone

## 2017-09-04 ENCOUNTER — Emergency Department (HOSPITAL_COMMUNITY)
Admission: EM | Admit: 2017-09-04 | Discharge: 2017-09-04 | Disposition: A | Discharge status: Home / Self Care | Payer: Medicare Other | Attending: Emergency Medicine | Admitting: Emergency Medicine

## 2017-09-04 ENCOUNTER — Encounter (HOSPITAL_COMMUNITY): Payer: Self-pay

## 2017-09-04 DIAGNOSIS — Z7982 Long term (current) use of aspirin: Secondary | ICD-10-CM | POA: Diagnosis not present

## 2017-09-04 DIAGNOSIS — E119 Type 2 diabetes mellitus without complications: Secondary | ICD-10-CM | POA: Insufficient documentation

## 2017-09-04 DIAGNOSIS — J45909 Unspecified asthma, uncomplicated: Secondary | ICD-10-CM | POA: Diagnosis not present

## 2017-09-04 DIAGNOSIS — Y998 Other external cause status: Secondary | ICD-10-CM | POA: Diagnosis not present

## 2017-09-04 DIAGNOSIS — Y9389 Activity, other specified: Secondary | ICD-10-CM | POA: Diagnosis not present

## 2017-09-04 DIAGNOSIS — F419 Anxiety disorder, unspecified: Secondary | ICD-10-CM | POA: Insufficient documentation

## 2017-09-04 DIAGNOSIS — I10 Essential (primary) hypertension: Secondary | ICD-10-CM | POA: Insufficient documentation

## 2017-09-04 DIAGNOSIS — Z794 Long term (current) use of insulin: Secondary | ICD-10-CM | POA: Insufficient documentation

## 2017-09-04 DIAGNOSIS — E039 Hypothyroidism, unspecified: Secondary | ICD-10-CM | POA: Diagnosis not present

## 2017-09-04 DIAGNOSIS — S80861A Insect bite (nonvenomous), right lower leg, initial encounter: Secondary | ICD-10-CM | POA: Diagnosis present

## 2017-09-04 DIAGNOSIS — Z87891 Personal history of nicotine dependence: Secondary | ICD-10-CM | POA: Diagnosis not present

## 2017-09-04 DIAGNOSIS — W57XXXA Bitten or stung by nonvenomous insect and other nonvenomous arthropods, initial encounter: Secondary | ICD-10-CM | POA: Insufficient documentation

## 2017-09-04 DIAGNOSIS — F319 Bipolar disorder, unspecified: Secondary | ICD-10-CM | POA: Insufficient documentation

## 2017-09-04 DIAGNOSIS — Z79899 Other long term (current) drug therapy: Secondary | ICD-10-CM | POA: Insufficient documentation

## 2017-09-04 DIAGNOSIS — Z9049 Acquired absence of other specified parts of digestive tract: Secondary | ICD-10-CM | POA: Diagnosis not present

## 2017-09-04 DIAGNOSIS — Y92129 Unspecified place in nursing home as the place of occurrence of the external cause: Secondary | ICD-10-CM | POA: Diagnosis not present

## 2017-09-04 NOTE — ED Provider Notes (Signed)
[facility] EMERGENCY DEPARTMENT Provider Note   CSN: [id number] Arrival date & time: 09/04/17  1420     History   Chief Complaint Chief Complaint  Patient presents with  . Leg Pain  . Insect Bite    HPI [name] is a 67 y.o. female.  Complaint is bug bite   HPI 67 year old female.  Primary care facility.  Transferred by ambulance.  Possible bug bite.  Small red area on her leg "it itches".  Past Medical History:  Diagnosis Date  . Allergic rhinitis    uses Nasonex daily as needed  . Allergy to angiotensin receptor blockers (ARB) 01/22/2014   Angioedema  . Anxiety    takes Ativan daily as needed  . Aspiration pneumonia ([place])   . Asthma    Albuterol inhaler and Neb daily as needed  . 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 [name], residual palpitations  . Bipolar 1 disorder ([place])   . Chronic back pain    reason unknown  . Constipation   . COPD (chronic obstructive pulmonary disease) ([place])   . DDD (degenerative disc disease), lumbar   . Depression    takes Zoloft daily as well as Cymbalta  . Diabetes mellitus    takes NOvolin daily  . Dysphagia   . Dysrhythmia    SVT ==ablation done by Dr. Rayann Heman 2007  . Esophageal reflux    takes Zantac daily  . History of bronchitis Dec 2014  . Hypertension    takes Losartan daily  . Hypothyroidism    takes Synthroid daily  . Joint pain   . Joint swelling    left ankle  . Low sodium syndrome   . Lumbar radiculopathy   . Seizures ([place]) 04-05-13   one time ran out of Primidone and had stopped it cold [place]  . Tremor, essential    takes Mysoline and Neurontin daily.  . Vocal cord dysfunction   . Weakness    in left arm    Patient Active Problem List   Diagnosis Date Noted  . Constipation 03/18/2017  . CAP (community acquired pneumonia) 04/21/2016  . History of colonic polyps   . Diverticulosis of colon without hemorrhage   . Other hemorrhoids   .  [name]'s ring   . Mucosal abnormality of stomach   . Encounter for screening colonoscopy 02/21/2015  . Upper airway cough syndrome 01/18/2015  . OAB (overactive bladder) 12/30/2014  . Incontinence in female 12/25/2014  . MDD (major depressive disorder), recurrent severe, without psychosis ([place]) 12/14/2014  . Thrush of mouth and esophagus ([place]) 11/23/2014  . Depression with somatization 11/05/2014  . Type II diabetes mellitus with manifestations ([place]) 10/23/2014  . Visit for screening mammogram 08/21/2014  . Hypokalemia 05/30/2014  . Tinea corporis 05/30/2014  . Lumbar scoliosis 04/24/2014  . Obstructive sleep apnea 04/23/2014  . Allergy to angiotensin receptor blockers (ARB) 01/22/2014  . S/P deep brain stimulator placement 01/10/2014  . Essential tremor 12/04/2013  . Greater trochanteric bursitis of left hip 11/07/2013  . Spinal stenosis of lumbar region 10/30/2013  . Right thyroid nodule 10/12/2013  . CMC arthritis, thumb, degenerative 06/15/2013  . IBS (irritable bowel syndrome) 11/03/2012  . Esophageal stenosis 11/01/2012  . Asthma, moderate persistent, poorly-controlled 09/01/2012  . Hypothyroidism 12/22/2011  . Hyperlipidemia with target LDL less than 100 06/22/2008  . Essential hypertension 09/04/2007  . TREMOR, ESSENTIAL 04/19/2007  . Seasonal and perennial allergic rhinitis 04/19/2007  .  Esophageal reflux 04/19/2007    Past Surgical History:  Procedure Laterality Date  . ABLATION FOR AVNRT    . BARTHOLIN GLAND CYST EXCISION     x 2  . BIOPSY  03/04/2015   Procedure: BIOPSY (Duodenal, Gastric);  Surgeon: Daneil Dolin, MD;  Location: [facility];  Service: Endoscopy;;  . CESAREAN SECTION    . CHOLECYSTECTOMY    . COLONOSCOPY  01/16/2004   ZOX:WRUEAV rectum/colon  . COLONOSCOPY WITH PROPOFOL N/A 03/04/2015   Dr.[name]- internal hemorrhoids, pancolonic diverticulosis, colonic polyp= tubular adenoma  . ESOPHAGEAL DILATION N/A 03/04/2015   Procedure: ESOPHAGEAL  DILATION WITH 54FR MALONEY DILATOR;  Surgeon: Daneil Dolin, MD;  Location: [facility];  Service: Endoscopy;  Laterality: N/A;  . ESOPHAGOGASTRODUODENOSCOPY (EGD) WITH ESOPHAGEAL DILATION  04/03/2002   WUJ:[id number]'G ring, otherwise normal esophagus, status post dilation with 56 F/Normal stomach  . ESOPHAGOGASTRODUODENOSCOPY (EGD) WITH ESOPHAGEAL DILATION N/A 11/08/2012   Dr. [name]:schatzki's ring s/p dilation/hiatal hernia  . ESOPHAGOGASTRODUODENOSCOPY (EGD) WITH PROPOFOL N/A 03/04/2015   Dr.[name]- schatzki's ring, hiatal hernia, scattered erosions bx= chronic inactive gastritis  . ESOPHAGOGASTRODUODENOSCOPY (EGD) WITH PROPOFOL N/A 05/06/2017   Procedure: ESOPHAGOGASTRODUODENOSCOPY (EGD) WITH PROPOFOL;  Surgeon: Daneil Dolin, MD;  Location: [facility];  Service: Endoscopy;  Laterality: N/A;  10:15am  . HEMORRHOID BANDING  2017   Dr.[name]  . LEFT ANKLE LIGAMENT REPAIR     x 2  . LEFT ELBOW REPAIR    . MALONEY DILATION N/A 05/06/2017   Procedure: Venia Minks DILATION;  Surgeon: Daneil Dolin, MD;  [facility];  Service: Endoscopy;  Laterality: N/A;  . MAXIMUM ACCESS (MAS)POSTERIOR LUMBAR INTERBODY FUSION (PLIF) 1 LEVEL N/A 04/24/2014   Procedure: Lumbar four-five Maximum access Surgery posterior lumbar interbody fusion;  Surgeon: [name], MD;  Location: [facility];  Service: Neurosurgery;  Laterality: N/A;  Lumbar four-five Maximum access Surgery posterior lumbar interbody fusion  . MULTIPLE EXTRACTIONS WITH ALVEOLOPLASTY N/A 06/18/2017   Procedure: MULTIPLE EXTRACTION;  Surgeon: Diona Browner, DDS;  Location: [facility];  Service: Oral Surgery;  Laterality: N/A;  . PARTIAL HYSTERECTOMY    . POLYPECTOMY  03/04/2015   Procedure: POLYPECTOMY (descending colon);  Surgeon: Daneil Dolin, MD;  Location: [facility];  Service: Endoscopy;;  . PULSE GENERATOR IMPLANT Bilateral 12/15/2013   Procedure: BILATERAL PULSE GENERATOR IMPLANT;  Surgeon: [name], MD;  Location: [facility];  Service:  Neurosurgery;  Laterality: Bilateral;  BILATERAL  . RIGHT BREAST CYST     benign  . SUBTHALAMIC STIMULATOR INSERTION Bilateral 12/04/2013   Procedure: Bilateral Deep brain stimulator placement;  Surgeon: [name], MD;  Location: [facility];  Service: Neurosurgery;  Laterality: Bilateral;  Bilateral Deep brain stimulator placement  . TONSILLECTOMY       OB History    Gravida  2   Para  2   Term  2   Preterm      AB      Living  1     SAB      TAB      Ectopic      Multiple      Live Births               Home Medications    Prior to Admission medications   Medication Sig Start Date End Date Taking? Authorizing Provider  acetaminophen (TYLENOL) 325 MG tablet Take 650 mg by mouth 3 (three) times daily.    [provider]  albuterol [facility] HFA)  108 (90 Base) MCG/ACT inhaler Inhale 2 puffs every 6 hours for asthma - rescue inhaler Patient taking differently: Inhale 2 puffs into the lungs every 6 (six) hours.  05/28/17   Baird Lyons D, MD  albuterol (PROVENTIL) (2.5 MG/3ML) 0.083% nebulizer solution Take 2.5 mg by nebulization every 6 (six) hours as needed for wheezing or shortness of breath.    [provider]  aspirin 81 MG chewable tablet Chew 81 mg by mouth daily. Not taking until after Monday 07/12/17 due to surgery on her trigger finger    [provider]  diphenhydrAMINE (BENADRYL) 25 mg capsule Take 25 mg by mouth 2 (two) times daily as needed for itching or allergies.    [provider]  divalproex (DEPAKOTE) 250 MG DR tablet Take 250 mg by mouth 2 (two) times daily.    [provider]  divalproex (DEPAKOTE) 500 MG DR tablet Take 500 mg by mouth 2 (two) times daily.    [provider]  fluticasone furoate-vilanterol (BREO ELLIPTA) 100-25 MCG/INH AEPB Inhale 1 puff into the lungs daily. 12/17/16   Baird Lyons D, MD  gabapentin (NEURONTIN) 300 MG capsule Take 300-600 mg by mouth See admin instructions.  Take 300 mg in the morning and 600 mg at bedtime    [provider]  [facility] 10 GM/15ML SOLN  09/01/17   [provider]  guaifenesin (ROBITUSSIN) 100 MG/5ML syrup Take 200 mg by mouth every 6 (six) hours as needed for cough.    [provider]  HYDROcodone-acetaminophen (NORCO/VICODIN) 5-325 MG tablet Take 1 tablet by mouth 3 (three) times daily.    [provider]  hydrocortisone (PROCTO-PAK) 1 % CREA Apply 1 application topically 2 (two) times daily as needed (hemorrhoid flare). For up to 10 days at a time 06/16/17   Carlis Stable, NP  insulin glargine (LANTUS) 100 UNIT/ML injection Inject 30 Units into the skin at bedtime.    [provider]  ipratropium (ATROVENT) 0.03 % nasal spray 1-2 puffs each nostril 3 times daily, before meals Patient taking differently: Place 1-2 sprays into both nostrils 3 (three) times daily.  01/12/17   Deneise Lever, MD  levothyroxine (SYNTHROID, LEVOTHROID) 75 MCG tablet TAKE (1) TABLET BY MOUTH ONCE DAILY BEFORE BREAKFAST. 01/12/15   Janith Lima, MD  loperamide (IMODIUM A-D) 2 MG tablet Take 2 mg by mouth 4 (four) times daily as needed for diarrhea or loose stools.     [provider]  lubiprostone (AMITIZA) 24 MCG capsule Take 1 capsule (24 mcg total) by mouth 2 (two) times daily with a meal. 06/30/17   Annitta Needs, NP  magnesium hydroxide (MILK OF MAGNESIA) 400 MG/5ML suspension Take 30 mLs by mouth at bedtime as needed for mild constipation.     [provider]  Menthol, Topical Analgesic, (BIOFREEZE) 4 % GEL Apply 1 application topically 2 (two) times daily. Apply to lower back and base of right thumb twice daily.    [provider]  metoprolol succinate (TOPROL-XL) 50 MG 24 hr tablet Take 50 mg by mouth daily. Take with or immediately following a meal.    [provider]  mirabegron ER (MYRBETRIQ) 25 MG TB24 tablet Take 25 mg by mouth daily.    [provider]  Multiple  Vitamins-Minerals (MULTIVITAMINS THER. W/MINERALS) TABS tablet TAKE ONE TABLET BY MOUTH ONCE DAILY. 04/16/15   Janith Lima, MD  naloxegol oxalate (MOVANTIK) 25 MG TABS tablet Take 25 mg by mouth daily.  [provider]  NOVOLOG FLEXPEN 100 UNIT/ML FlexPen Inject 8 Units into the skin 3 (three) times daily before meals.  07/01/15   [provider]  Omega 3 1000 MG CAPS TAKE (1) CAPSULE BY MOUTH ONCE DAILY. 04/16/15   Janith Lima, MD  pantoprazole (PROTONIX) 40 MG tablet Take 40 mg by mouth daily.    [provider]  polyethylene glycol (MIRALAX / GLYCOLAX) packet Take 17 g by mouth daily as needed for moderate constipation.  11/26/16   [provider]  potassium chloride SA (K-DUR,KLOR-CON) 20 MEQ tablet Take 20 mEq by mouth daily.    [provider]  primidone (MYSOLINE) 50 MG tablet Take 2 tablets (100 mg total) by mouth 2 (two) times daily. Patient taking differently: Take 150 mg by mouth 3 (three) times daily.  03/06/15   Tat, Eustace Quail, DO  QUEtiapine (SEROQUEL) 100 MG tablet Take 100 mg by mouth at bedtime.    [provider]  simvastatin (ZOCOR) 5 MG tablet Take 1 tablet by mouth daily. 08/21/17   [provider]  sitaGLIPtin (JANUVIA) 100 MG tablet Take 100 mg by mouth daily.    [provider]  sodium chloride (OCEAN) 0.65 % SOLN nasal spray Take 1-2 sprays in nostril every hour as needed for rhinitis 06/08/17   Baird Lyons D, MD  sodium chloride 1 g tablet Take 1 g by mouth 2 (two) times daily.    [provider]  sucralfate (CARAFATE) 1 g tablet Take 1 g by mouth 2 (two) times daily.  04/24/16   [provider]  triamterene-hydrochlorothiazide (MAXZIDE-25) 37.5-25 MG tablet Take 1 tablet by mouth daily.    [provider]    Family History Family History  Problem Relation Age of Onset  . Lung cancer Father        DIED AGE 81 LUNG CA  . Alcohol abuse Father   . Anxiety disorder  Father   . Depression Father   . COPD Mother        DIED AGE 38,MRSA,COPD,PNEUMONIA  . Pneumonia Mother        DIED AGE 38,MRSA,COPD,PNEUMONIA  . Supraventricular tachycardia Mother   . Anxiety disorder Mother   . Depression Mother   . Alcohol abuse Mother   . Heart disease Sister        DIED AGE 58, SMOKER,?HEART  . COPD Brother        AGE 38  . Colon cancer Neg Hx     Social History Social History   Tobacco Use  . Smoking status: Former Smoker    Packs/day: 0.30    Years: 10.00    Pack years: 3.00    Types: Cigarettes    Last attempt to quit: 04/23/1993    Years since quitting: 24.3  . Smokeless tobacco: Never Used  . Tobacco comment: quit smoking 25+yrs ago  Substance Use Topics  . Alcohol use: No    Alcohol/week: 0.0 oz  . Drug use: No     Allergies   Invokana [canagliflozin]; Losartan; Metformin and related; Latex; Other; Oxycodone-acetaminophen; and Mucinex [guaifenesin er]   Review of Systems Review of Systems  Constitutional: Negative for appetite change, chills, diaphoresis, fatigue and fever.  HENT: Negative for mouth sores, sore throat and trouble swallowing.   Eyes: Negative for visual disturbance.  Respiratory: Negative for cough, chest tightness, shortness of breath and wheezing.   Cardiovascular: Negative for chest pain.  Gastrointestinal: Negative for abdominal distention, abdominal pain, diarrhea, nausea  and vomiting.  Endocrine: Negative for polydipsia, polyphagia and polyuria.  Genitourinary: Negative for dysuria, frequency and hematuria.  Musculoskeletal: Negative for gait problem.  Skin: Negative for color change, pallor and rash.       Possible bug bite  Neurological: Negative for dizziness, syncope, light-headedness and headaches.  Hematological: Does not bruise/bleed easily.  Psychiatric/Behavioral: Negative for behavioral problems and confusion.     Physical Exam Updated Vital Signs BP (!) 147/73   Pulse 66   Temp 98.1 F (36.7  C) (Oral)   Resp 18   Wt 81.6 kg (180 lb)   SpO2 100%   BMI 30.42 kg/m   Physical Exam  Constitutional: She is oriented to person, place, and time. She appears well-developed and well-nourished. No distress.  HENT:  Head: Normocephalic.  Eyes: Pupils are equal, round, and reactive to light. Conjunctivae are normal. No scleral icterus.  Neck: Normal range of motion. Neck supple. No thyromegaly present.  Cardiovascular: Normal rate and regular rhythm. Exam reveals no gallop and no friction rub.  No murmur heard. Pulmonary/Chest: Effort normal and breath sounds normal. No respiratory distress. She has no wheezes. She has no rales.  Abdominal: Soft. Bowel sounds are normal. She exhibits no distension. There is no tenderness. There is no rebound.  Musculoskeletal: Normal range of motion.  Neurological: She is alert and oriented to person, place, and time.  Skin: Skin is warm and dry. No rash noted.  Tiny red area only few millimeters.  Overlying scab.  Not vesicular.  Does not appear secondarily infected.  Not herpetic.  Not bullous  Psychiatric: She has a normal mood and affect. Her behavior is normal.     ED Treatments / Results  Labs (all labs ordered are listed, but only abnormal results are displayed) Labs Reviewed - No data to display  EKG None  Radiology No results found.  Procedures Procedures (including critical care time)  Medications Ordered in ED Medications - No data to display   Initial Impression / Assessment and Plan / ED Course  I have reviewed the triage vital signs and the nursing notes.  Pertinent labs & imaging results that were available during my care of the patient were reviewed by me and considered in my medical decision making (see chart for details).     Wound care  Final Clinical Impressions(s) / ED Diagnoses   Final diagnoses:  Insect bite of right lower leg, initial encounter    ED Discharge Orders    None       Tanna Furry,  MD 09/04/17 1448

## 2017-09-04 NOTE — ED Triage Notes (Signed)
Pt brought in from [place] due to right leg pain from spider bite approx one week ago. Pt noted to have a scabbed over area to right lower leg. Pt reports it itches

## 2017-09-04 NOTE — Discharge Instructions (Signed)
Keep covered with bandaid and antibiotic ointment.

## 2017-09-08 ENCOUNTER — Telehealth: Payer: Self-pay | Admitting: Internal Medicine

## 2017-09-08 ENCOUNTER — Telehealth: Payer: Self-pay | Admitting: Neurology

## 2017-09-08 NOTE — Telephone Encounter (Signed)
Spoke with Claiborne Billings who states Primidone was actually changed by our office ( 2 in the am, 1 in the evening) but patient got confused and thought that was done by PCP.   She did have Depakote increased to 1250 mg in the morning, 500 mg in the afternoon, 500 mg in the evening by her psychiatrist.   Juluis Rainier.

## 2017-09-08 NOTE — Telephone Encounter (Signed)
Copied from [place] [id number]. Topic: Appointment Scheduling - Scheduling Inquiry for Clinic >> Sep 08, 2017  8:58 AM Bea Graff, NT wrote: Reason for CRM: Pt calling and would like to transfer her care from Dr. Ronnald Ramp to Dr. Volanda Napoleon at [place]. She has not been seen at the [place] office since 2017. Please advise if this is ok for pt to switch providers.

## 2017-09-08 NOTE — Telephone Encounter (Signed)
Patient is requesting to transfer care from Dr Ronnald Ramp at [facility] to Dr Volanda Napoleon at [place]. Dr. Ronnald Ramp, is this okay with you? Dr. Volanda Napoleon, is this okay with you?

## 2017-09-08 NOTE — Telephone Encounter (Signed)
Yes, I am ok with this 

## 2017-09-08 NOTE — Telephone Encounter (Signed)
Patient [name] needing to let you know that her PCP took her off of some of her Primidone and started an new medication? She also wanted you to call and speak with Claiborne Billings or Zigmund Daniel regarding this change. Thanks

## 2017-09-10 ENCOUNTER — Ambulatory Visit (INDEPENDENT_AMBULATORY_CARE_PROVIDER_SITE_OTHER): Payer: Medicare Other | Admitting: Podiatry

## 2017-09-10 ENCOUNTER — Encounter: Payer: Self-pay | Admitting: Podiatry

## 2017-09-10 ENCOUNTER — Ambulatory Visit: Payer: Medicare Other | Admitting: Podiatry

## 2017-09-10 DIAGNOSIS — E1142 Type 2 diabetes mellitus with diabetic polyneuropathy: Secondary | ICD-10-CM

## 2017-09-10 DIAGNOSIS — M79674 Pain in right toe(s): Secondary | ICD-10-CM | POA: Diagnosis not present

## 2017-09-10 DIAGNOSIS — M79675 Pain in left toe(s): Secondary | ICD-10-CM

## 2017-09-10 DIAGNOSIS — B351 Tinea unguium: Secondary | ICD-10-CM

## 2017-09-13 NOTE — Progress Notes (Signed)
Subjective:   Patient ID: [name], female   DOB: 67 y.o.   MRN: [id number]   HPI Patient presents with elongated nailbeds 1-5 both feet that are thick and painful when palpated and make shoe gear difficult   ROS      Objective:  Physical Exam  Yellow brittle thick nailbeds 1-5 both feet that are painful when palpated and incurvated in the corners     Assessment:  Mycotic nail infection with pain 1-5 both feet     Plan:  Debride painful nailbeds 1-5 both feet with no iatrogenic bleeding noted

## 2017-09-13 NOTE — Telephone Encounter (Signed)
ok 

## 2017-09-14 NOTE — Telephone Encounter (Signed)
Can you help schedule this patient with Dr Volanda Napoleon?

## 2017-09-23 ENCOUNTER — Encounter: Payer: Self-pay | Admitting: Family Medicine

## 2017-09-23 NOTE — Telephone Encounter (Signed)
The phone number listed goes to the [facility]. I sent a letter to the address listed in the chart to have the patient call the office to schedule an appointment to establish care with Dr. Volanda Napoleon.

## 2017-09-30 ENCOUNTER — Emergency Department (HOSPITAL_COMMUNITY)
Admission: EM | Admit: 2017-09-30 | Discharge: 2017-10-01 | Disposition: A | Discharge status: Home / Self Care | Payer: Medicare Other | Attending: Emergency Medicine | Admitting: Emergency Medicine

## 2017-09-30 ENCOUNTER — Encounter (HOSPITAL_COMMUNITY): Payer: Self-pay | Admitting: Emergency Medicine

## 2017-09-30 ENCOUNTER — Other Ambulatory Visit: Payer: Self-pay

## 2017-09-30 ENCOUNTER — Emergency Department (HOSPITAL_COMMUNITY): Payer: Medicare Other

## 2017-09-30 DIAGNOSIS — Z91040 Latex allergy status: Secondary | ICD-10-CM | POA: Diagnosis not present

## 2017-09-30 DIAGNOSIS — I10 Essential (primary) hypertension: Secondary | ICD-10-CM | POA: Insufficient documentation

## 2017-09-30 DIAGNOSIS — Z87891 Personal history of nicotine dependence: Secondary | ICD-10-CM | POA: Diagnosis not present

## 2017-09-30 DIAGNOSIS — K5900 Constipation, unspecified: Secondary | ICD-10-CM | POA: Diagnosis not present

## 2017-09-30 DIAGNOSIS — E119 Type 2 diabetes mellitus without complications: Secondary | ICD-10-CM | POA: Insufficient documentation

## 2017-09-30 DIAGNOSIS — R1031 Right lower quadrant pain: Secondary | ICD-10-CM

## 2017-09-30 DIAGNOSIS — J449 Chronic obstructive pulmonary disease, unspecified: Secondary | ICD-10-CM | POA: Insufficient documentation

## 2017-09-30 DIAGNOSIS — Z794 Long term (current) use of insulin: Secondary | ICD-10-CM | POA: Diagnosis not present

## 2017-09-30 LAB — CBC WITH DIFFERENTIAL/PLATELET
Basophils Absolute: 0 10*3/uL (ref 0.0–0.1)
Basophils Relative: 0 %
Eosinophils Absolute: 0.2 10*3/uL (ref 0.0–0.7)
Eosinophils Relative: 3 %
HCT: 35.6 % — ABNORMAL LOW (ref 36.0–46.0)
Hemoglobin: 12.5 g/dL (ref 12.0–15.0)
Lymphocytes Relative: 45 %
Lymphs Abs: 2.7 10*3/uL (ref 0.7–4.0)
MCH: 32.5 pg (ref 26.0–34.0)
MCHC: 35.1 g/dL (ref 30.0–36.0)
MCV: 92.5 fL (ref 78.0–100.0)
Monocytes Absolute: 0.6 10*3/uL (ref 0.1–1.0)
Monocytes Relative: 9 %
Neutro Abs: 2.7 10*3/uL (ref 1.7–7.7)
Neutrophils Relative %: 43 %
Platelets: 167 10*3/uL (ref 150–400)
RBC: 3.85 MIL/uL — ABNORMAL LOW (ref 3.87–5.11)
RDW: 12.3 % (ref 11.5–15.5)
WBC: 6.1 10*3/uL (ref 4.0–10.5)

## 2017-09-30 LAB — COMPREHENSIVE METABOLIC PANEL
ALT: 22 U/L (ref 0–44)
AST: 21 U/L (ref 15–41)
Albumin: 3.6 g/dL (ref 3.5–5.0)
Alkaline Phosphatase: 64 U/L (ref 38–126)
Anion gap: 8 (ref 5–15)
BUN: 10 mg/dL (ref 8–23)
CO2: 27 mmol/L (ref 22–32)
Calcium: 8.7 mg/dL — ABNORMAL LOW (ref 8.9–10.3)
Chloride: 91 mmol/L — ABNORMAL LOW (ref 98–111)
Creatinine, Ser: 0.52 mg/dL (ref 0.44–1.00)
GFR calc Af Amer: >60 mL/min (ref >60)
GFR calc non Af Amer: >60 mL/min (ref >60)
Glucose, Bld: 150 mg/dL — ABNORMAL HIGH (ref 70–99)
Potassium: 3.8 mmol/L (ref 3.5–5.1)
Sodium: 126 mmol/L — ABNORMAL LOW (ref 135–145)
Total Bilirubin: 0.3 mg/dL (ref 0.3–1.2)
Total Protein: 6.2 g/dL — ABNORMAL LOW (ref 6.5–8.1)

## 2017-09-30 LAB — LIPASE, BLOOD: Lipase: 28 U/L (ref 11–51)

## 2017-09-30 MED ORDER — IOPAMIDOL (ISOVUE-300) INJECTION 61%
100.0000 mL | Freq: Once | INTRAVENOUS | Status: AC | PRN
  Administered 2017-10-01: 100 mL via INTRAVENOUS

## 2017-09-30 NOTE — ED Provider Notes (Signed)
[facility] EMERGENCY DEPARTMENT Provider Note   CSN: [id number] Arrival date & time: 09/30/17  2204     History   Chief Complaint Chief Complaint  Patient presents with  . Abdominal Pain    HPI [name] is a 67 y.o. female.  HPI   66yF with abdominal pain. Onset this evening. Pain is in RLQ. Constant. Worse with movement. Doesn't radiate. No n/v/d. Had hard stool earlier today. No fever. No urinary complaints. surgical history significant for c-section and partial hysterectomy.  Past Medical History:  Diagnosis Date  . Allergic rhinitis    uses Nasonex daily as needed  . Allergy to angiotensin receptor blockers (ARB) 01/22/2014   Angioedema  . Anxiety    takes Ativan daily as needed  . Aspiration pneumonia ([place])   . Asthma    Albuterol inhaler and Neb daily as needed  . 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 [name], residual palpitations  . Bipolar 1 disorder ([place])   . Chronic back pain    reason unknown  . Constipation   . COPD (chronic obstructive pulmonary disease) ([place])   . DDD (degenerative disc disease), lumbar   . Depression    takes Zoloft daily as well as Cymbalta  . Diabetes mellitus    takes NOvolin daily  . Dysphagia   . Dysrhythmia    SVT ==ablation done by Dr. Rayann Heman 2007  . Esophageal reflux    takes Zantac daily  . History of bronchitis Dec 2014  . Hypertension    takes Losartan daily  . Hypothyroidism    takes Synthroid daily  . Joint pain   . Joint swelling    left ankle  . Low sodium syndrome   . Lumbar radiculopathy   . Seizures ([place]) 04-05-13   one time ran out of Primidone and had stopped it cold [place]  . Tremor, essential    takes Mysoline and Neurontin daily.  . Vocal cord dysfunction   . Weakness    in left arm    Patient Active Problem List   Diagnosis Date Noted  . Constipation 03/18/2017  . CAP (community acquired pneumonia) 04/21/2016  . History of  colonic polyps   . Diverticulosis of colon without hemorrhage   . Other hemorrhoids   . [name]'s ring   . Mucosal abnormality of stomach   . Encounter for screening colonoscopy 02/21/2015  . Upper airway cough syndrome 01/18/2015  . OAB (overactive bladder) 12/30/2014  . Incontinence in female 12/25/2014  . MDD (major depressive disorder), recurrent severe, without psychosis ([place]) 12/14/2014  . Thrush of mouth and esophagus ([place]) 11/23/2014  . Depression with somatization 11/05/2014  . Type II diabetes mellitus with manifestations ([place]) 10/23/2014  . Visit for screening mammogram 08/21/2014  . Hypokalemia 05/30/2014  . Tinea corporis 05/30/2014  . Lumbar scoliosis 04/24/2014  . Obstructive sleep apnea 04/23/2014  . Allergy to angiotensin receptor blockers (ARB) 01/22/2014  . S/P deep brain stimulator placement 01/10/2014  . Essential tremor 12/04/2013  . Greater trochanteric bursitis of left hip 11/07/2013  . Spinal stenosis of lumbar region 10/30/2013  . Right thyroid nodule 10/12/2013  . CMC arthritis, thumb, degenerative 06/15/2013  . IBS (irritable bowel syndrome) 11/03/2012  . Esophageal stenosis 11/01/2012  . Asthma, moderate persistent, poorly-controlled 09/01/2012  . Hypothyroidism 12/22/2011  . Hyperlipidemia with target LDL less than 100 06/22/2008  . Essential hypertension 09/04/2007  . TREMOR, ESSENTIAL 04/19/2007  . Seasonal and  perennial allergic rhinitis 04/19/2007  . Esophageal reflux 04/19/2007    Past Surgical History:  Procedure Laterality Date  . ABDOMINAL HYSTERECTOMY    . ABLATION FOR AVNRT    . APPENDECTOMY    . BARTHOLIN GLAND CYST EXCISION     x 2  . BIOPSY  03/04/2015   Procedure: BIOPSY (Duodenal, Gastric);  Surgeon: Daneil Dolin, MD;  Location: [facility] ORS;  Service: Endoscopy;;  . CESAREAN SECTION    . CHOLECYSTECTOMY    . COLONOSCOPY  01/16/2004   ZYS:AYTKZS rectum/colon  . COLONOSCOPY WITH PROPOFOL N/A 03/04/2015   Dr.[name]- internal  hemorrhoids, pancolonic diverticulosis, colonic polyp= tubular adenoma  . ESOPHAGEAL DILATION N/A 03/04/2015   Procedure: ESOPHAGEAL DILATION WITH 54FR MALONEY DILATOR;  Surgeon: Daneil Dolin, MD;  Location: [facility] ORS;  Service: Endoscopy;  Laterality: N/A;  . ESOPHAGOGASTRODUODENOSCOPY (EGD) WITH ESOPHAGEAL DILATION  04/03/2002   WFU:[id number] ring, otherwise normal esophagus, status post dilation with 56 F/Normal stomach  . ESOPHAGOGASTRODUODENOSCOPY (EGD) WITH ESOPHAGEAL DILATION N/A 11/08/2012   Dr. [name]:schatzki's ring s/p dilation/hiatal hernia  . ESOPHAGOGASTRODUODENOSCOPY (EGD) WITH PROPOFOL N/A 03/04/2015   Dr.[name]- schatzki's ring, hiatal hernia, scattered erosions bx= chronic inactive gastritis  . ESOPHAGOGASTRODUODENOSCOPY (EGD) WITH PROPOFOL N/A 05/06/2017   Procedure: ESOPHAGOGASTRODUODENOSCOPY (EGD) WITH PROPOFOL;  Surgeon: Daneil Dolin, MD;  Location: [facility];  Service: Endoscopy;  Laterality: N/A;  10:15am  . HEMORRHOID BANDING  2017   Dr.[name]  . LEFT ANKLE LIGAMENT REPAIR     x 2  . LEFT ELBOW REPAIR    . MALONEY DILATION N/A 05/06/2017   Procedure: Venia Minks DILATION;  Surgeon: Daneil Dolin, MD;  Location: [facility];  Service: Endoscopy;  Laterality: N/A;  . MAXIMUM ACCESS (MAS)POSTERIOR LUMBAR INTERBODY FUSION (PLIF) 1 LEVEL N/A 04/24/2014   Procedure: Lumbar four-five Maximum access Surgery posterior lumbar interbody fusion;  Surgeon: [name], MD;  Location: [place] NEURO ORS;  Service: Neurosurgery;  Laterality: N/A;  Lumbar four-five Maximum access Surgery posterior lumbar interbody fusion  . MULTIPLE EXTRACTIONS WITH ALVEOLOPLASTY N/A 06/18/2017   Procedure: MULTIPLE EXTRACTION;  Surgeon: Diona Browner, DDS;  Location: [place];  Service: Oral Surgery;  Laterality: N/A;  . PARTIAL HYSTERECTOMY    . POLYPECTOMY  03/04/2015   Procedure: POLYPECTOMY (descending colon);  Surgeon: Daneil Dolin, MD;  Location: [facility];  Service: Endoscopy;;  . PULSE GENERATOR  IMPLANT Bilateral 12/15/2013   Procedure: BILATERAL PULSE GENERATOR IMPLANT;  Surgeon: [name], MD;  Location: [place] NEURO ORS;  Service: Neurosurgery;  Laterality: Bilateral;  BILATERAL  . RIGHT BREAST CYST     benign  . SUBTHALAMIC STIMULATOR INSERTION Bilateral 12/04/2013   Procedure: Bilateral Deep brain stimulator placement;  Surgeon: [name], MD;  Location: [place] NEURO ORS;  Service: Neurosurgery;  Laterality: Bilateral;  Bilateral Deep brain stimulator placement  . TONSILLECTOMY       OB History    Gravida  2   Para  2   Term  2   Preterm      AB      Living  1     SAB      TAB      Ectopic      Multiple      Live Births               Home Medications    Prior to Admission medications   Medication Sig Start Date End Date Taking? Authorizing Provider  acetaminophen (TYLENOL) 325 MG tablet Take 650  mg by mouth 3 (three) times daily.    [provider]  albuterol (PROAIR HFA) 108 (90 Base) MCG/ACT inhaler Inhale 2 puffs every 6 hours for asthma - rescue inhaler Patient taking differently: Inhale 2 puffs into the lungs every 6 (six) hours.  05/28/17   Baird Lyons D, MD  albuterol (PROVENTIL) (2.5 MG/3ML) 0.083% nebulizer solution Take 2.5 mg by nebulization every 6 (six) hours as needed for wheezing or shortness of breath.    [provider]  aspirin 81 MG chewable tablet Chew 81 mg by mouth daily. Not taking until after Monday 07/12/17 due to surgery on her trigger finger    [provider]  diphenhydrAMINE (BENADRYL) 25 mg capsule Take 25 mg by mouth 2 (two) times daily as needed for itching or allergies.    [provider]  divalproex (DEPAKOTE) 250 MG DR tablet Take 250 mg by mouth 2 (two) times daily.    [provider]  divalproex (DEPAKOTE) 500 MG DR tablet Take 500 mg by mouth 2 (two) times daily.    [provider]  fluticasone furoate-vilanterol (BREO ELLIPTA) 100-25 MCG/INH AEPB Inhale 1 puff into  the lungs daily. 12/17/16   Baird Lyons D, MD  gabapentin (NEURONTIN) 300 MG capsule Take 300-600 mg by mouth See admin instructions. Take 300 mg in the morning and 600 mg at bedtime    [provider]  [facility] 10 GM/15ML SOLN  09/01/17   [provider]  guaifenesin (ROBITUSSIN) 100 MG/5ML syrup Take 200 mg by mouth every 6 (six) hours as needed for cough.    [provider]  HYDROcodone-acetaminophen (NORCO/VICODIN) 5-325 MG tablet Take 1 tablet by mouth 3 (three) times daily.    [provider]  hydrocortisone (PROCTO-PAK) 1 % CREA Apply 1 application topically 2 (two) times daily as needed (hemorrhoid flare). For up to 10 days at a time 06/16/17   Carlis Stable, NP  insulin glargine (LANTUS) 100 UNIT/ML injection Inject 30 Units into the skin at bedtime.    [provider]  ipratropium (ATROVENT) 0.03 % nasal spray 1-2 puffs each nostril 3 times daily, before meals Patient taking differently: Place 1-2 sprays into both nostrils 3 (three) times daily.  01/12/17   Deneise Lever, MD  levothyroxine (SYNTHROID, LEVOTHROID) 75 MCG tablet TAKE (1) TABLET BY MOUTH ONCE DAILY BEFORE BREAKFAST. 01/12/15   Janith Lima, MD  loperamide (IMODIUM A-D) 2 MG tablet Take 2 mg by mouth 4 (four) times daily as needed for diarrhea or loose stools.     [provider]  lubiprostone (AMITIZA) 24 MCG capsule Take 1 capsule (24 mcg total) by mouth 2 (two) times daily with a meal. 06/30/17   Annitta Needs, NP  magnesium hydroxide (MILK OF MAGNESIA) 400 MG/5ML suspension Take 30 mLs by mouth at bedtime as needed for mild constipation.     [provider]  Menthol, Topical Analgesic, (BIOFREEZE) 4 % GEL Apply 1 application topically 2 (two) times daily. Apply to lower back and base of right thumb twice daily.    [provider]  metoprolol succinate (TOPROL-XL) 50 MG 24 hr tablet Take 50 mg by mouth daily. Take with or immediately following a meal.     [provider]  mirabegron ER (MYRBETRIQ) 25 MG TB24 tablet Take 25 mg by mouth daily.    [provider]  Multiple Vitamins-Minerals (MULTIVITAMINS THER. W/MINERALS) TABS tablet TAKE ONE TABLET BY MOUTH ONCE DAILY. 04/16/15   Ronnald Ramp,  Arvid Right, MD  naloxegol oxalate (MOVANTIK) 25 MG TABS tablet Take 25 mg by mouth daily.    [provider]  NOVOLOG FLEXPEN 100 UNIT/ML FlexPen Inject 8 Units into the skin 3 (three) times daily before meals.  07/01/15   [provider]  Omega 3 1000 MG CAPS TAKE (1) CAPSULE BY MOUTH ONCE DAILY. 04/16/15   Janith Lima, MD  pantoprazole (PROTONIX) 40 MG tablet Take 40 mg by mouth daily.    [provider]  polyethylene glycol (MIRALAX / GLYCOLAX) packet Take 17 g by mouth daily as needed for moderate constipation.  11/26/16   [provider]  potassium chloride SA (K-DUR,KLOR-CON) 20 MEQ tablet Take 20 mEq by mouth daily.    [provider]  primidone (MYSOLINE) 50 MG tablet Take 2 tablets (100 mg total) by mouth 2 (two) times daily. Patient taking differently: Take 150 mg by mouth 3 (three) times daily.  03/06/15   Tat, Eustace Quail, DO  QUEtiapine (SEROQUEL) 100 MG tablet Take 100 mg by mouth at bedtime.    [provider]  simvastatin (ZOCOR) 5 MG tablet Take 1 tablet by mouth daily. 08/21/17   [provider]  sitaGLIPtin (JANUVIA) 100 MG tablet Take 100 mg by mouth daily.    [provider]  sodium chloride (OCEAN) 0.65 % SOLN nasal spray Take 1-2 sprays in nostril every hour as needed for rhinitis 06/08/17   Baird Lyons D, MD  sodium chloride 1 g tablet Take 1 g by mouth 2 (two) times daily.    [provider]  sucralfate (CARAFATE) 1 g tablet Take 1 g by mouth 2 (two) times daily.  04/24/16   [provider]  triamterene-hydrochlorothiazide (MAXZIDE-25) 37.5-25 MG tablet Take 1 tablet by mouth daily.    [provider]    Family History Family  History  Problem Relation Age of Onset  . Lung cancer Father        DIED AGE 9 LUNG CA  . Alcohol abuse Father   . Anxiety disorder Father   . Depression Father   . COPD Mother        DIED AGE 45,MRSA,COPD,PNEUMONIA  . Pneumonia Mother        DIED AGE 45,MRSA,COPD,PNEUMONIA  . Supraventricular tachycardia Mother   . Anxiety disorder Mother   . Depression Mother   . Alcohol abuse Mother   . Heart disease Sister        DIED AGE 27, SMOKER,?HEART  . COPD Brother        AGE 69  . Colon cancer Neg Hx     Social History Social History   Tobacco Use  . Smoking status: Former Smoker    Packs/day: 0.30    Years: 10.00    Pack years: 3.00    Types: Cigarettes    Last attempt to quit: 04/23/1993    Years since quitting: 24.4  . Smokeless tobacco: Never Used  . Tobacco comment: quit smoking 25+yrs ago  Substance Use Topics  . Alcohol use: No    Alcohol/week: 0.0 oz  . Drug use: No     Allergies   Invokana [canagliflozin]; Losartan; Metformin and related; Latex; Other; Oxycodone-acetaminophen; and Mucinex [guaifenesin er]   Review of Systems Review of Systems  All systems reviewed and negative, other than as noted in HPI.  Physical Exam Updated Vital Signs BP 112/77 (BP Location: Right Arm)   Pulse 83   Temp 98.1 F (36.7 C) (Oral)  Resp 16   Ht 5' 4.5" (1.638 m)   Wt 83.5 kg (184 lb)   SpO2 99%   BMI 31.10 kg/m   Physical Exam  Constitutional: She appears well-developed and well-nourished. No distress.  HENT:  Head: Normocephalic and atraumatic.  Eyes: Conjunctivae are normal. Right eye exhibits no discharge. Left eye exhibits no discharge.  Neck: Neck supple.  Cardiovascular: Normal rate, regular rhythm and normal heart sounds. Exam reveals no gallop and no friction rub.  No murmur heard. Pulmonary/Chest: Effort normal and breath sounds normal. No respiratory distress.  Abdominal: Soft. She exhibits no distension. There is no tenderness.  Mild ttp  across lower abdomen, maximal in RLQ  Musculoskeletal: She exhibits no edema or tenderness.  Neurological: She is alert.  Skin: Skin is warm and dry.  Psychiatric: She has a normal mood and affect. Her behavior is normal. Thought content normal.  Nursing note and vitals reviewed.    ED Treatments / Results  Labs (all labs ordered are listed, but only abnormal results are displayed) Labs Reviewed  CBC WITH DIFFERENTIAL/PLATELET  URINALYSIS, ROUTINE W REFLEX MICROSCOPIC  COMPREHENSIVE METABOLIC PANEL  LIPASE, BLOOD    EKG None  Radiology No results found.  Procedures Procedures (including critical care time)  Medications Ordered in ED Medications - No data to display   Initial Impression / Assessment and Plan / ED Course  I have reviewed the triage vital signs and the nursing notes.  Pertinent labs & imaging results that were available during my care of the patient were reviewed by me and considered in my medical decision making (see chart for details).     66yF with RLQ pain. Mild tenderness on exam. Afebrile. Nontoxic. Declining pain meds initially. Labs, UA, CT.   Final Clinical Impressions(s) / ED Diagnoses   Final diagnoses:  Right lower quadrant abdominal pain    ED Discharge Orders    None       [name], MD 09/30/17 2344

## 2017-09-30 NOTE — ED Triage Notes (Signed)
Patient brought in by [facility]. Patient complaining of right lower abdominal pain. Patient had a bowel movement prior to arrival and stated that her stool was hard in texture. Patient states pain started around 6:30 PM. Patient conscious and alert x 4.

## 2017-10-01 DIAGNOSIS — K5900 Constipation, unspecified: Secondary | ICD-10-CM | POA: Diagnosis not present

## 2017-10-01 MED ORDER — MAGNESIUM CITRATE PO SOLN: 1.0000 | Freq: Once | ORAL | 0 refills | Status: AC

## 2017-10-01 MED ORDER — POLYETHYLENE GLYCOL 3350 17 G PO PACK: 17.0000 g | PACK | Freq: Every day | ORAL | 0 refills | Status: AC | PRN

## 2017-10-01 MED ORDER — DOCUSATE SODIUM 100 MG PO CAPS: 100.0000 mg | ORAL_CAPSULE | Freq: Every day | ORAL | 0 refills | Status: DC

## 2017-10-01 NOTE — ED Notes (Signed)
Unable to get patient's signature on pad due to computer issues. Patient transported by [employer] back to the [facility]. [place] stated that they did not have transportation to pick up the patient. Report called to [place] at the [facility].Patient's prescriptions reviewed and patient's prescriptions and clothes were given to [facility] for transport. Instructions reviewed with patient.

## 2017-10-01 NOTE — ED Provider Notes (Signed)
Patient signed out to me to follow-up on CT scan.  Patient seen and evaluated by Dr. Wilson Singer for right-sided abdominal pain.  CT scan has been performed and shows only increased right-sided colonic stool burden that might explain the pain.  No other acute findings.  Lab work is unremarkable.  Patient reassured, will discharge with treatment for constipation, follow-up with PCP.   Orpah Greek, MD 10/01/17 0111

## 2017-10-04 ENCOUNTER — Telehealth: Payer: Self-pay | Admitting: Internal Medicine

## 2017-10-04 NOTE — Telephone Encounter (Signed)
ATC x4 and received busy signal.  Will call back.  

## 2017-10-05 MED ORDER — UNABLE TO FIND: 0 refills | Status: DC

## 2017-10-05 NOTE — Telephone Encounter (Signed)
Ok to order lactose free milk, per patient request

## 2017-10-05 NOTE — Telephone Encounter (Signed)
Faxed over order for lactose free milk. Nothing further needed at this time.

## 2017-10-05 NOTE — Telephone Encounter (Signed)
Patient med tech called back; pt was put on the phone; pt would like to speak to nurse about previous message (prescription for lactose free milk) and would like to have an appt with CY only; pt requesting an appointment for next week for frequent asthma attacks and dark-yellowish phlegm.  Contact is [facility] [phone]; speak to Caren Griffins or ask for med tech on the 100 hall.

## 2017-10-05 NOTE — Telephone Encounter (Signed)
Called and spoke to med tech at [facility]. Scheduled an appointment with Dr. Annamaria Boots on 7/15 per request of patient for asthma symptoms and productive cough. Patient is requesting a prescription for lactose free milk. The fax number to send Rx is [phone].  Dr. Annamaria Boots please advise if we can sent an Rx for lactose free milk. Thanks!

## 2017-10-05 NOTE — Telephone Encounter (Signed)
Attempted to contact pt. Received a busy signal x2. Will try back. 

## 2017-10-13 ENCOUNTER — Ambulatory Visit (INDEPENDENT_AMBULATORY_CARE_PROVIDER_SITE_OTHER): Payer: Medicare Other | Admitting: Family Medicine

## 2017-10-13 ENCOUNTER — Encounter: Payer: Self-pay | Admitting: Family Medicine

## 2017-10-13 VITALS — BP 110/70 | HR 78 | Temp 98.2°F | Ht 64.0 in | Wt 183.0 lb

## 2017-10-13 DIAGNOSIS — F319 Bipolar disorder, unspecified: Secondary | ICD-10-CM

## 2017-10-13 DIAGNOSIS — E119 Type 2 diabetes mellitus without complications: Secondary | ICD-10-CM | POA: Diagnosis not present

## 2017-10-13 DIAGNOSIS — G250 Essential tremor: Secondary | ICD-10-CM

## 2017-10-13 DIAGNOSIS — K219 Gastro-esophageal reflux disease without esophagitis: Secondary | ICD-10-CM | POA: Diagnosis not present

## 2017-10-13 NOTE — Progress Notes (Signed)
Subjective:    Patient ID: [name], female    DOB: 1951-03-07, 67 y.o.   MRN: [id number]  No chief complaint on file.   HPI  Pt is a 67 yo female with pmh sig for SVT s/p ablation, tremor s/p deep brain stimulator, GERD, DM II, OSA, seasonal allergies, HTN.  Pt was seen today for chronic concerns.  Pt is a resident of [facility].  She comes to clinic with a list of meds and forms for completion.  Pt states she has been living in assisted living x 1-2 yrs.  Pt was previously seen at [facility], then at another clinic in [place].  DM II: -pt taking latus 30 units and novolog 8 units TID with meals per pt if her bs is >149. -pt states this is too much insulin for her. -there are no bs logs with the pt.  GERD: -Followed by [facility] -s/p maloney dilation 03/18/17 -Taking pantoprazole -Previous symptoms including hoarseness and cough  Essential tremor: -Followed by neurology -s/p deep brain stimulator  bipolar disorder: -Taking Zoloft and Cymbalta -Taking Ativan for anxiety  Past Medical History:  Diagnosis Date  . Allergic rhinitis    uses Nasonex daily as needed  . Allergy to angiotensin receptor blockers (ARB) 01/22/2014   Angioedema  . Anxiety    takes Ativan daily as needed  . Aspiration pneumonia ([place])   . Asthma    Albuterol inhaler and Neb daily as needed  . 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 [name], residual palpitations  . Bipolar 1 disorder ([place])   . Chronic back pain    reason unknown  . Constipation   . COPD (chronic obstructive pulmonary disease) ([place])   . DDD (degenerative disc disease), lumbar   . Depression    takes Zoloft daily as well as Cymbalta  . Diabetes mellitus    takes NOvolin daily  . Dysphagia   . Dysrhythmia    SVT ==ablation done by Dr. Rayann Heman 2007  . Esophageal reflux    takes Zantac daily  . History of bronchitis Dec 2014  . Hypertension    takes Losartan daily  . Hypothyroidism    takes Synthroid daily  . Joint pain   . Joint swelling    left ankle  . Low sodium syndrome   . Lumbar radiculopathy   . Seizures ([place]) 04-05-13   one time ran out of Primidone and had stopped it cold [place]  . Tremor, essential    takes Mysoline and Neurontin daily.  . Vocal cord dysfunction   . Weakness    in left arm    Allergies  Allergen Reactions  . Invokana [Canagliflozin] Other (See Comments)    Yeast infectios  . Losartan Other (See Comments)    Angioedema   . Metformin And Related Diarrhea  . Latex Other (See Comments)    Other Reaction: redness, blisters  . Other Other (See Comments)    Air fresheners - on MAR  . Oxycodone-Acetaminophen Itching and Other (See Comments)    TYLOX. Caused internal itching per patient   . Mucinex [Guaifenesin Er] Other (See Comments)    MUCINEX REACTION: asthma attacks    ROS General: Denies fever, chills, night sweats, changes in weight, changes in appetite HEENT: Denies headaches, ear pain, changes in vision, rhinorrhea, sore throat CV: Denies CP, palpitations, SOB, orthopnea Pulm: Denies SOB, cough, wheezing GI: Denies abdominal pain, nausea, vomiting, diarrhea,  constipation GU: Denies dysuria, hematuria, frequency, vaginal discharge Msk: Denies muscle cramps, joint pains Neuro: Denies weakness, numbness, tingling Skin: Denies rashes, bruising Psych: Denies depression, anxiety, hallucinations     Objective:    Blood pressure 110/70, pulse 78, temperature 98.2 F (36.8 C), temperature source Oral, height 5\' 4"  (1.626 m), weight 183 lb (83 kg), SpO2 96 %.   Gen. Pleasant, well-nourished, in no distress, normal affect.  Poor historian HEENT: [place]/AT, face symmetric, no scleral icterus, PERRLA, EOMI, nares patent without drainage.  TMs normal bilaterally.  No cervical lymphadenopathy. Lungs: no accessory muscle use, CTAB, no wheezes or rales Cardiovascular: RRR, no m/r/g, no  peripheral edema Abdomen: BS present Neuro:  A&Ox3, CN II-XII intact, sitting in wheelchair Skin:  Warm, no lesions/ rash   Wt Readings from Last 3 Encounters:  10/13/17 183 lb (83 kg)  09/30/17 184 lb (83.5 kg)  09/04/17 180 lb (81.6 kg)    Lab Results  Component Value Date   WBC 6.1 09/30/2017   HGB 12.5 09/30/2017   HCT 35.6 (L) 09/30/2017   PLT 167 09/30/2017   GLUCOSE 150 (H) 09/30/2017   CHOL 180 02/13/2015   TRIG 175.0 (H) 02/13/2015   HDL 64.80 02/13/2015   LDLCALC 80 02/13/2015   ALT 22 09/30/2017   AST 21 09/30/2017   NA 126 (L) 09/30/2017   K 3.8 09/30/2017   CL 91 (L) 09/30/2017   CREATININE 0.52 09/30/2017   BUN 10 09/30/2017   CO2 27 09/30/2017   TSH 1.01 05/21/2015   INR 1.05 04/08/2015   HGBA1C 6.4 (H) 06/18/2017   MICROALBUR 1.7 02/13/2015    Assessment/Plan:  Diabetes mellitus without complication ([name]) -continue lantus 30 units and novolog 8 units TID with meals -no med adjustments made as no fsbs log available.  Gastroesophageal reflux disease, esophagitis presence not specified -Continue pantoprazole -Continue follow-up [facility]  Essential tremor -s/p deep brain stimulator -Continue following with neurology  Bipolar affective disorder, remission status unspecified ([place]) -Continue following with behavioral health  Grier Mitts, MD

## 2017-10-14 ENCOUNTER — Ambulatory Visit: Payer: Self-pay | Admitting: Family Medicine

## 2017-10-14 ENCOUNTER — Telehealth: Payer: Self-pay | Admitting: *Deleted

## 2017-10-14 NOTE — Telephone Encounter (Signed)
Copied from [place] [id number]. Topic: General - Other >> Oct 14, 2017 11:16 AM Keene Breath wrote: Reason for CRM: Marylyn Ishihara from [facility] called to request a diet order for patient.  He needs this information as soon as possible.  CB# [phone].

## 2017-10-15 ENCOUNTER — Ambulatory Visit (INDEPENDENT_AMBULATORY_CARE_PROVIDER_SITE_OTHER): Payer: Medicare Other | Admitting: Internal Medicine

## 2017-10-15 ENCOUNTER — Encounter: Payer: Self-pay | Admitting: Internal Medicine

## 2017-10-15 VITALS — BP 134/80 | HR 86 | Ht 64.5 in | Wt 184.0 lb

## 2017-10-15 DIAGNOSIS — R05 Cough: Secondary | ICD-10-CM

## 2017-10-15 DIAGNOSIS — J4540 Moderate persistent asthma, uncomplicated: Secondary | ICD-10-CM | POA: Diagnosis not present

## 2017-10-15 DIAGNOSIS — R058 Other specified cough: Secondary | ICD-10-CM

## 2017-10-15 MED ORDER — LEVALBUTEROL HCL 0.63 MG/3ML IN NEBU
0.6300 mg | INHALATION_SOLUTION | Freq: Once | RESPIRATORY_TRACT | Status: AC
  Administered 2017-10-15: 0.63 mg via RESPIRATORY_TRACT

## 2017-10-15 MED ORDER — AZITHROMYCIN 250 MG PO TABS: ORAL_TABLET | ORAL | 0 refills | Status: DC

## 2017-10-15 NOTE — Patient Instructions (Signed)
Script Zpak printed        Order- neb xop 0.63     Dx exacerbation asthmatic bronchitis  Please call if we can help

## 2017-10-15 NOTE — Assessment & Plan Note (Signed)
Always some difficulty sorting out anxiety, upper airway cough/reflux/LPR, from her asthma.  I do not think she is aspirating significantly now. Plan-continued attention to reflux precautions.

## 2017-10-15 NOTE — Assessment & Plan Note (Signed)
We will treat this as an exacerbation of asthmatic bronchitis. Plan-Z-Pak, nebulizer treatment Xopenex.  Continue current meds.

## 2017-10-15 NOTE — Telephone Encounter (Signed)
Please advise. I do not see anything up front in the folder.

## 2017-10-15 NOTE — Telephone Encounter (Signed)
Marylyn Ishihara has faxed form today, checking status. Need form back today. Please advise

## 2017-10-15 NOTE — Progress Notes (Signed)
Patient ID: [name], female    DOB: 1951-01-27, 67 y.o.   MRN: [id number]  HPI  female former smoker followed for Allergic rhinitis, Asthma, complicated by anxiety/VCD, CAD, GERD, DM2, tachycardia, tremor/deep brain stimulator, HBP, now living in assisted living Allergy Profile 01/10/2013-total IgE 166 with significant elevations for most common allergens except molds She had been on allergy vaccine years ago. Office Spirometry 12/13/2014-mild obstruction, mild restriction of forced vital capacity Upper endoscopy 2016-[name]'s ring and gastric erosions ----------------------------------------------------------  07/09/17-  67 year old female former smoker followed for Allergic rhinitis, Asthma, complicated by anxiety/VCD, CAD, GERD, DM, tachycardia, tremor/deep brain stimulator, HBP, now living in assisted living ----asthma- few attacks this week, inhaler is helping,  Breo 100, Neb albuterol, albuterol hfa Occasional wheeze.  Uses rescue inhaler.  No recent need for nebulizer.  Bothersome clear nasal drainage.  Uses nasal spray occasionally but does not know what it is called.  Denies fever, purulent or bloody discharge.  Usually sleeps undisturbed by her breathing. Occasional twinges right anterior lower chest wall unrelated to food and more common when sitting.  She does not know how long it has been going on but not progressive.  10/15/2017- 67 year old female former smoker followed for Allergic rhinitis, Asthma, complicated by anxiety/VCD, CAD, GERD, DM, tachycardia, tremor/deep brain stimulator, HBP, now living in assisted living/ [place] -----Asthma: Pt is having flare ups of asthma and productive cough-yellow in color for about 2 weeks.  Breo 100, neb albuterol, Proair hfa Reports increased cough with occasional wheeze over the last 2 months.  Sputum occasionally milky or yellow.  No fever or blood.  Much sinus drainage while using Atrovent 0.03% nasal spray 3 times daily.   Benadryl twice daily.  ROS-see HPI    "+" = positive Constitutional:    weight loss, night sweats, fevers, chills, fatigue, lassitude. HEENT:    headaches, difficulty swallowing, tooth/dental problems, sore throat,       sneezing, itching, ear ache, nasal congestion, +post nasal drip, snoring CV:    chest pain, orthopnea, PND, swelling in lower extremities, anasarca,                                                  dizziness, palpitations Resp:   +shortness of breath with exertion or at rest.                +productive cough,    non-productive cough, coughing up of blood.              +change in color of mucus.  +wheezing.   Skin:    rash or lesions. GI:  + heartburn, indigestion, abdominal pain, nausea, vomiting, GU:  MS:   joint pain, stiffness, decreased range of motion, back pain. Neuro-    + chronic tremor, +unsteady/ walker Psych:  +change in mood or affect.  +depression or anxiety.   memory loss.  OBJ- Physical Exam General- Alert, Oriented, Affect-appropriate, Distress- none acute, talkative,+obese,           +wheelchair Skin- rash-none, lesions- none, excoriation- none Lymphadenopathy- none Head- atraumatic            Eyes- Gross vision intact, PERRLA, conjunctivae and secretions clear            Ears- Hearing, canals-normal            Nose- Clear,  no-Septal dev, mucus, polyps, erosion, perforation             Throat- Mallampati II , mucosa -tongue slightly coated , drainage- none, tonsils-                  atrophic, + missing teeth    Neck- flexible , trachea midline, no stridor , thyroid nl, carotid no bruit Chest - symmetrical excursion , unlabored           Heart/CV- RRR , no murmur , no gallop  , no rub, nl s1 s2                           - JVD- none , edema- none, stasis changes- none, varices- none           Lung- + coarse/ unlabored, wheeze -none., cough-none , dullness-none, rub- none, rhonchi-none           Chest wall-+ pacemaker left Abd-  Br/ Gen/ Rectal- Not  done, not indicated Extrem- cyanosis- none, clubbing, none, atrophy- none, strength- nl, + rolling walker Neuro- + tremor and head bob,+ mild dysphonia, + unsteady

## 2017-10-18 ENCOUNTER — Ambulatory Visit: Payer: Self-pay | Admitting: Internal Medicine

## 2017-10-18 NOTE — Telephone Encounter (Signed)
We received forms from [facility] on the pt, requests to complete and then fax them back. Please Advice

## 2017-10-18 NOTE — Telephone Encounter (Signed)
Please Advice 

## 2017-10-19 NOTE — Telephone Encounter (Signed)
FYI the forms do have blanks that need to be filled out.  The forms that were sent over also have need to be filled out by pt's pulmonologist or the provider who dx'd her with bronchitis recently.

## 2017-10-19 NOTE — Telephone Encounter (Signed)
Speech therapy and nursing orders to evaluate breathing and swallowing issues.  Forms faxed on 10/14/17 and they needs these back asap.  Order forms for diet as well are needed to be faxed back.   There is nothing to be filled out, the forms just need to be reviewed, signed and faxed back.    Fax# [phone]  Please advise Izora Gala. Thanks.

## 2017-10-21 NOTE — Telephone Encounter (Signed)
Called left a detailed  message for Elmyra Ricks with [facility] to call the office regarding pt home health services.

## 2017-10-21 NOTE — Telephone Encounter (Signed)
[facility] called to f/up on this request.

## 2017-10-25 ENCOUNTER — Telehealth: Payer: Self-pay | Admitting: Family Medicine

## 2017-10-25 NOTE — Telephone Encounter (Signed)
Copied from [place] [id number]. Topic: General - Other >> Oct 14, 2017 11:16 AM Keene Breath wrote: Reason for CRM: Marylyn Ishihara from [facility] called to request a diet order for patient.  He needs this information as soon as possible.  CB# [phone].  >> Oct 15, 2017  2:03 PM Alfredia Ferguson R wrote: Also requesting speech therapy, and nursing for asthmatic bronchitis.  Callback # [phone] Addison Naegeli ( she is available to pick up orders) >> Oct 25, 2017 11:49 AM Vernona Rieger wrote: Marylyn Ishihara from [facility] called and said his called dropped with whomever he was speaking with in the office. Please call back @ [phone]

## 2017-10-25 NOTE — Telephone Encounter (Signed)
Matter previously addressed.  This provider has no record of asthmatic bronchitis.  Ok to have speech for swallow study.

## 2017-10-25 NOTE — Telephone Encounter (Signed)
Pt Paperwork was faxed to Addison Naegeli with [facility]

## 2017-10-25 NOTE — Telephone Encounter (Signed)
Please Advise

## 2017-10-26 NOTE — Telephone Encounter (Signed)
Called the number given by Marylyn Ishihara and requested for them to re-fax paper work since we have not receive this.

## 2017-10-26 NOTE — Telephone Encounter (Signed)
[name] w/ [facility] is calling back in to follow up on orders. I did advise him of providers response in regards to speech. [name] says that; that is only a part of what he is requesting. He stated that they have faxed over sheets of physician orders and a diet order to provider 2 weeks ago and need this signed and faxed back for pt as soon as possible. If provider did not received please call him at [phone] ext : [phone] (office #)   Fax: [phone]

## 2017-10-26 NOTE — Telephone Encounter (Signed)
Paperwork was refaxed and confirmation received

## 2017-10-28 ENCOUNTER — Telehealth: Payer: Self-pay | Admitting: Family Medicine

## 2017-10-28 NOTE — Telephone Encounter (Signed)
Copied from [place] [id number]. Topic: General - Other >> Oct 28, 2017 12:00 PM Judyann Munson wrote: Reason for CRM: [facility] is calling to  request verbal order urine culture. He is also requesting  paperwork that was refaxed on 10-25-17, stated he hasn't received that paper work back. please advise

## 2017-10-28 NOTE — Telephone Encounter (Signed)
Copied from [place] [phone]. Topic: Quick Communication - See Telephone Encounter >> Oct 28, 2017  1:29 PM Rutherford Nail, NT wrote: CRM for notification. See Telephone encounter for: 10/28/17. Pete with [facility] calling and states that the patient is having signs and symptoms of uti. States that patient does not have not transportation for her scheduled appointment for tomorrow (10/29/17). Wanting to know if they can have an order for their nurse to come out and do a urine sample tomorrow.  CB#: [phone]

## 2017-10-29 ENCOUNTER — Ambulatory Visit: Payer: Medicare Other | Admitting: Family Medicine

## 2017-10-29 ENCOUNTER — Telehealth: Payer: Self-pay | Admitting: Family Medicine

## 2017-10-29 DIAGNOSIS — Z0289 Encounter for other administrative examinations: Secondary | ICD-10-CM

## 2017-10-29 NOTE — Telephone Encounter (Signed)
Copied from [place] [phone]. Topic: General - Other >> Oct 29, 2017 11:02 AM Keene Breath wrote: Reason for CRM: Marylyn Ishihara Director from [facility] called to get status on fax he sent to Dr. Volanda Napoleon for her signature.  He stated that he needs these forms fill out today.  Please advise.  CB# [phone].

## 2017-10-29 NOTE — Telephone Encounter (Signed)
Pt forms faxed to [facility] with [facility], confirmation received

## 2017-10-29 NOTE — Telephone Encounter (Signed)
Spoke with [place] with [facility] regarding pt needing a verbal orders for urine since pt has UTI symptoms. Pt is not able to make it to the clinic today, Laurey Arrow states that there is no nurse who can get to pt today, Advised to call [place] office and schedule pt to be seen on Saturday clinic where they can test her for uti and give Antibiotics if needed

## 2017-10-29 NOTE — Telephone Encounter (Signed)
Pt forms were refax to attention Marylyn Ishihara, confirmation received

## 2017-11-04 NOTE — Telephone Encounter (Signed)
Kim from [facility] is calling to state they have not received any order from the results she faxed over on the 11-01-17. She is needing these orders  Today. The fax number is [phone] Attention to [facility].

## 2017-11-05 ENCOUNTER — Telehealth: Payer: Self-pay | Admitting: Family Medicine

## 2017-11-05 NOTE — Telephone Encounter (Signed)
I do not see where Dr Volanda Napoleon has refilled this prescription. Okay to refill?

## 2017-11-05 NOTE — Telephone Encounter (Signed)
[name] resident care director at [facility] phone # [phone] and fax # [phone] calls for a refill request for Norco 5-325- mg take 1 tablet 3 X a day, this is not take as needed, it is given every day at scheduled times. Pt took one last night and is now out, she takes this medication for hand & back pain, wheelchair bound, has been taking for at least 1 year. If approved, please fax the actual script to above number and also can call Kyle back. There were forms filled out for dietary & standing orders for provider to sign and fax back, Marylyn Ishihara says he has not received these, can we refax. [name] may have given originally, however has since advised pt get this medication from primary care provider.

## 2017-11-05 NOTE — Telephone Encounter (Signed)
We need to confirm who has filled in past.  I would not recommend taking over refills unless her primary, Dr Volanda Napoleon, agrees.

## 2017-11-05 NOTE — Telephone Encounter (Signed)
Opened in error

## 2017-11-05 NOTE — Telephone Encounter (Signed)
Spoke with Clinical biochemist at [facility] and explained that the Hydrocodone refill will have to come from the provider who first prescribed it.

## 2017-11-05 NOTE — Telephone Encounter (Signed)
n

## 2017-11-08 NOTE — Telephone Encounter (Signed)
Pt paperwork was faxed to [facility] home care to attention Marylyn Ishihara, spoke with Maudie Mercury and confirmed that fax was received.

## 2017-11-17 ENCOUNTER — Ambulatory Visit: Payer: Self-pay | Admitting: Family Medicine

## 2017-11-17 ENCOUNTER — Emergency Department (HOSPITAL_COMMUNITY)
Admission: EM | Admit: 2017-11-17 | Discharge: 2017-11-17 | Disposition: A | Discharge status: Home / Self Care | Payer: Medicare Other | Attending: Emergency Medicine | Admitting: Emergency Medicine

## 2017-11-17 ENCOUNTER — Other Ambulatory Visit: Payer: Self-pay

## 2017-11-17 ENCOUNTER — Encounter (HOSPITAL_COMMUNITY): Payer: Self-pay

## 2017-11-17 ENCOUNTER — Emergency Department (HOSPITAL_COMMUNITY): Payer: Medicare Other

## 2017-11-17 DIAGNOSIS — Z87891 Personal history of nicotine dependence: Secondary | ICD-10-CM | POA: Insufficient documentation

## 2017-11-17 DIAGNOSIS — E039 Hypothyroidism, unspecified: Secondary | ICD-10-CM | POA: Insufficient documentation

## 2017-11-17 DIAGNOSIS — M549 Dorsalgia, unspecified: Secondary | ICD-10-CM | POA: Insufficient documentation

## 2017-11-17 DIAGNOSIS — Z79899 Other long term (current) drug therapy: Secondary | ICD-10-CM | POA: Diagnosis not present

## 2017-11-17 DIAGNOSIS — W19XXXA Unspecified fall, initial encounter: Secondary | ICD-10-CM

## 2017-11-17 DIAGNOSIS — R079 Chest pain, unspecified: Secondary | ICD-10-CM | POA: Insufficient documentation

## 2017-11-17 DIAGNOSIS — I10 Essential (primary) hypertension: Secondary | ICD-10-CM | POA: Insufficient documentation

## 2017-11-17 DIAGNOSIS — E119 Type 2 diabetes mellitus without complications: Secondary | ICD-10-CM | POA: Diagnosis not present

## 2017-11-17 DIAGNOSIS — M79602 Pain in left arm: Secondary | ICD-10-CM | POA: Diagnosis not present

## 2017-11-17 DIAGNOSIS — J45909 Unspecified asthma, uncomplicated: Secondary | ICD-10-CM | POA: Insufficient documentation

## 2017-11-17 DIAGNOSIS — Z7982 Long term (current) use of aspirin: Secondary | ICD-10-CM | POA: Insufficient documentation

## 2017-11-17 DIAGNOSIS — Z0289 Encounter for other administrative examinations: Secondary | ICD-10-CM

## 2017-11-17 DIAGNOSIS — Z794 Long term (current) use of insulin: Secondary | ICD-10-CM | POA: Insufficient documentation

## 2017-11-17 NOTE — ED Notes (Signed)
Pt transported to xray 

## 2017-11-17 NOTE — ED Notes (Signed)
Pt reports she only able to transfer at this time

## 2017-11-17 NOTE — ED Notes (Signed)
Pt noted to have bruising and abraised area to left breast. Pt also reports left upper arm pain and neck stiffness

## 2017-11-17 NOTE — ED Provider Notes (Signed)
Emergency Department Provider Note   I have reviewed the triage vital signs and the nursing notes.   HISTORY  Chief Complaint Fall   HPI [name] is a 67 y.o. female with multiple medical problems as documented below the presents to the emergency department today secondary to a fall.  Patient states that she rolled out of bed sometime around 5:00 this morning and was not able to get up.  When she was able to be assisted up she had pain in her left arm and her upper middle back.  No neurologic deficits that are new.  No chest pain, abdominal pain, extremity pain.  Did not hit her head or pass out.  No headache. No other associated or modifying symptoms.    Past Medical History:  Diagnosis Date  . Allergic rhinitis    uses Nasonex daily as needed  . Allergy to angiotensin receptor blockers (ARB) 01/22/2014   Angioedema  . Anxiety    takes Ativan daily as needed  . Aspiration pneumonia ([place])   . Asthma    Albuterol inhaler and Neb daily as needed  . 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 [name], residual palpitations  . Bipolar 1 disorder ([place])   . Chronic back pain    reason unknown  . Constipation   . COPD (chronic obstructive pulmonary disease) ([place])   . DDD (degenerative disc disease), lumbar   . Depression    takes Zoloft daily as well as Cymbalta  . Diabetes mellitus    takes NOvolin daily  . Dysphagia   . Dysrhythmia    SVT ==ablation done by Dr. Rayann Heman 2007  . Esophageal reflux    takes Zantac daily  . Frequent headaches   . History of bronchitis Dec 2014  . Hypertension    takes Losartan daily  . Hypothyroidism    takes Synthroid daily  . Joint pain   . Joint swelling    left ankle  . Low sodium syndrome   . Lumbar radiculopathy   . Seizures ([place]) 04-05-13   one time ran out of Primidone and had stopped it cold [place]  . SVT (supraventricular tachycardia) ([place])   . Tremor, essential    takes Mysoline and Neurontin daily.  . Vocal cord dysfunction   . Weakness    in left arm    Patient Active Problem List   Diagnosis Date Noted  . Constipation 03/18/2017  . CAP (community acquired pneumonia) 04/21/2016  . History of colonic polyps   . Diverticulosis of colon without hemorrhage   . Other hemorrhoids   . [name]'s ring   . Mucosal abnormality of stomach   . Encounter for screening colonoscopy 02/21/2015  . Upper airway cough syndrome 01/18/2015  . OAB (overactive bladder) 12/30/2014  . Incontinence in female 12/25/2014  . MDD (major depressive disorder), recurrent severe, without psychosis ([place]) 12/14/2014  . Thrush of mouth and esophagus ([place]) 11/23/2014  . Depression with somatization 11/05/2014  . Type II diabetes mellitus with manifestations ([place]) 10/23/2014  . Visit for screening mammogram 08/21/2014  . Hypokalemia 05/30/2014  . Tinea corporis 05/30/2014  . Lumbar scoliosis 04/24/2014  . Obstructive sleep apnea 04/23/2014  . Allergy to angiotensin receptor blockers (ARB) 01/22/2014  . S/P deep brain stimulator placement 01/10/2014  . Essential tremor 12/04/2013  . Greater trochanteric bursitis of left hip 11/07/2013  . Spinal stenosis of lumbar region 10/30/2013  . Right thyroid nodule 10/12/2013  .  CMC arthritis, thumb, degenerative 06/15/2013  . IBS (irritable bowel syndrome) 11/03/2012  . Esophageal stenosis 11/01/2012  . Asthma, moderate persistent, poorly-controlled 09/01/2012  . Hypothyroidism 12/22/2011  . Hyperlipidemia with target LDL less than 100 06/22/2008  . Essential hypertension 09/04/2007  . TREMOR, ESSENTIAL 04/19/2007  . Seasonal and perennial allergic rhinitis 04/19/2007  . Esophageal reflux 04/19/2007    Past Surgical History:  Procedure Laterality Date  . ABDOMINAL HYSTERECTOMY    . ABLATION FOR AVNRT    . APPENDECTOMY    . BARTHOLIN GLAND CYST EXCISION     x 2  . BIOPSY  03/04/2015   Procedure: BIOPSY (Duodenal,  Gastric);  Surgeon: Daneil Dolin, MD;  Location: [facility];  Service: Endoscopy;;  . BREAST SURGERY    . CESAREAN SECTION    . CHOLECYSTECTOMY    . COLONOSCOPY  01/16/2004   ZGY:FVCBSW rectum/colon  . COLONOSCOPY WITH PROPOFOL N/A 03/04/2015   Dr.[name]- internal hemorrhoids, pancolonic diverticulosis, colonic polyp= tubular adenoma  . DEEP BRAIN STIMULATOR PLACEMENT    . DENTAL SURGERY    . ESOPHAGEAL DILATION N/A 03/04/2015   Procedure: ESOPHAGEAL DILATION WITH 54FR MALONEY DILATOR;  Surgeon: Daneil Dolin, MD;  Location: [facility];  Service: Endoscopy;  Laterality: N/A;  . ESOPHAGOGASTRODUODENOSCOPY (EGD) WITH ESOPHAGEAL DILATION  04/03/2002   HQP:RFFMBWGY'K ring, otherwise normal esophagus, status post dilation with 56 F/Normal stomach  . ESOPHAGOGASTRODUODENOSCOPY (EGD) WITH ESOPHAGEAL DILATION N/A 11/08/2012   Dr. [name]:schatzki's ring s/p dilation/hiatal hernia  . ESOPHAGOGASTRODUODENOSCOPY (EGD) WITH PROPOFOL N/A 03/04/2015   Dr.[name]- schatzki's ring, hiatal hernia, scattered erosions bx= chronic inactive gastritis  . ESOPHAGOGASTRODUODENOSCOPY (EGD) WITH PROPOFOL N/A 05/06/2017   Procedure: ESOPHAGOGASTRODUODENOSCOPY (EGD) WITH PROPOFOL;  Surgeon: Daneil Dolin, MD;  [facility];  Service: Endoscopy;  Laterality: N/A;  10:15am  . FINGER SURGERY    . HEMORRHOID BANDING  2017   Dr.[name]  . LEFT ANKLE LIGAMENT REPAIR     x 2  . LEFT ELBOW REPAIR    . MALONEY DILATION N/A 05/06/2017   Procedure: Venia Minks DILATION;  Surgeon: Daneil Dolin, MD;  [facility];  Service: Endoscopy;  Laterality: N/A;  . MAXIMUM ACCESS (MAS)POSTERIOR LUMBAR INTERBODY FUSION (PLIF) 1 LEVEL N/A 04/24/2014   Procedure: Lumbar four-five Maximum access Surgery posterior lumbar interbody fusion;  Surgeon: [name], MD;  Location: [facility];  Service: Neurosurgery;  Laterality: N/A;  Lumbar four-five Maximum access Surgery posterior lumbar interbody fusion  . MULTIPLE EXTRACTIONS WITH  ALVEOLOPLASTY N/A 06/18/2017   Procedure: MULTIPLE EXTRACTION;  Surgeon: Diona Browner, DDS;  Location: [facility];  Service: Oral Surgery;  Laterality: N/A;  . PARTIAL HYSTERECTOMY    . POLYPECTOMY  03/04/2015   Procedure: POLYPECTOMY (descending colon);  Surgeon: Daneil Dolin, MD;  Location: [facility];  Service: Endoscopy;;  . PULSE GENERATOR IMPLANT Bilateral 12/15/2013   Procedure: BILATERAL PULSE GENERATOR IMPLANT;  Surgeon: [name], MD;  Location: [place];  Service: Neurosurgery;  Laterality: Bilateral;  BILATERAL  . RIGHT BREAST CYST     benign  . SUBTHALAMIC STIMULATOR INSERTION Bilateral 12/04/2013   Procedure: Bilateral Deep brain stimulator placement;  Surgeon: [name], MD;  Location: [facility];  Service: Neurosurgery;  Laterality: Bilateral;  Bilateral Deep brain stimulator placement  . TONSILLECTOMY      Current Outpatient Rx  . Order #: [id number] Class: Historical Med  . Order #: [id number] Class: Print  . Order #: [id number] Class: Historical Med  . Order #: [id number] Class: Historical Med  .  Order #: [id number] Class: Print  . Order #: [id number] Class: Historical Med  . Order #: [id number] Class: Historical Med  . Order #: [id number] Class: Historical Med  . Order #: [id number] Class: Print  . Order #: [id number] Class: Normal  . Order #: [id number] Class: Historical Med  . Order #: [id number] Class: Historical Med  . Order #: [id number] Class: Historical Med  . Order #: [id number] Class: Historical Med  . Order #: [id number] Class: Normal  . Order #: [id number] Class: Historical Med  . Order #: [id number] Class: Print  . Order #: [id number] Class: Normal  . Order #: [id number] Class: Historical Med  . Order #: [id number] Class: Normal  . Order #: [id number] Class: Historical Med  . Order #: [id number] Class: Historical Med  . Order #: [id number] Class: Historical Med  . Order #: [id number] Class: Historical Med  . Order #: [id number] Class: Normal  . Order #: [id number] Class: Historical Med  .  Order #: [id number] Class: Historical Med  . Order #: [id number] Class: Normal  . Order #: [id number] Class: Historical Med  . Order #: [id number] Class: Print  . Order #: [id number] Class: Historical Med  . Order #: [id number] Class: Normal  . Order #: [id number] Class: Historical Med  . Order #: [id number] Class: Historical Med  . Order #: [id number] Class: Historical Med  . Order #: [id number] Class: Normal  . Order #: [id number] Class: Historical Med  . Order #: [id number] Class: Historical Med  . Order #: [id number] Class: Historical Med  . Order #: [id number] Class: Print    Allergies Invokana [canagliflozin]; Losartan; Metformin and related; Latex; Other; Oxycodone-acetaminophen; and Mucinex [guaifenesin er]  Family History  Problem Relation Age of Onset  . Lung cancer Father        DIED AGE 33 LUNG CA  . Alcohol abuse Father   . Anxiety disorder Father   . Depression Father   . COPD Mother        DIED AGE 61,MRSA,COPD,PNEUMONIA  . Pneumonia Mother        DIED AGE 61,MRSA,COPD,PNEUMONIA  . Supraventricular tachycardia Mother   . Anxiety disorder Mother   . Depression Mother   . Alcohol abuse Mother   . Heart disease Sister        DIED AGE 69, SMOKER,?HEART  . COPD Brother        AGE 33  . Colon cancer Neg Hx     Social History Social History   Tobacco Use  . Smoking status: Former Smoker    Packs/day: 0.30    Years: 10.00    Pack years: 3.00    Types: Cigarettes    Last attempt to quit: 04/23/1993    Years since quitting: 24.5  . Smokeless tobacco: Never Used  . Tobacco comment: quit smoking 25+yrs ago  Substance Use Topics  . Alcohol use: No    Alcohol/week: 0.0 standard drinks  . Drug use: No    Review of Systems  All other systems negative except as documented in the HPI. All pertinent positives and negatives as reviewed in the HPI. ____________________________________________   PHYSICAL EXAM:  VITAL SIGNS: ED Triage Vitals [11/17/17 1011]  Enc Vitals Group     BP  108/66     Pulse Rate 87     Resp 20     Temp 98 F (36.7 C)     Temp Source Oral     SpO2 100 %     Weight 184 lb (83.5 kg)     Height 5\' 4"  (1.626 m)     Head Circumference      Peak Flow      Pain Score 7  Pain Loc      Pain Edu?      Excl. in [place]?     Constitutional: Alert and oriented. Well appearing and in no acute distress. Eyes: Conjunctivae are normal. PERRL. EOMI. Head: Atraumatic. Nose: No congestion/rhinnorhea. Mouth/Throat: Mucous membranes are moist.  Oropharynx non-erythematous. Neck: No stridor.  No meningeal signs.   Cardiovascular: Normal rate, regular rhythm. Good peripheral circulation. Grossly normal heart sounds.   Respiratory: Normal respiratory effort.  No retractions. Lungs CTAB. Gastrointestinal: Soft and nontender. No distention.  Musculoskeletal: She has tenderness to C7-T2 area midline.  She also has tenderness to her left chest with palpation.  She has tenderness to her left humerus with palpation as well.  Her right upper extremity bilateral lower extremities are without tenderness.  No pelvis tenderness.  No pain with range of motion of her hips. Neurologic:  Normal speech and language. No gross focal neurologic deficits are appreciated.  Skin:  Skin is warm, dry and intact. No rash noted.   ____________________________________________   RADIOLOGY  Dg Chest 2 View  Result Date: 11/17/2017 CLINICAL DATA:  Pain after frequent falls. EXAM: CHEST - 2 VIEW COMPARISON:  03/08/2017 and 11/16/2016 FINDINGS: Heart size and pulmonary vascularity are normal. Lungs are clear. No acute bone abnormality. Pulse generator implants present in the upper chest on the right and left. IMPRESSION: No acute abnormalities.  No change since the prior exam. Electronically Signed   By: Lorriane Shire M.D.   On: 11/17/2017 11:49   Dg Thoracic Spine 2 View  Result Date: 11/17/2017 CLINICAL DATA:  Pain after frequent falls. EXAM: THORACIC SPINE 2 VIEWS COMPARISON:  Chest  x-ray dated 11/16/2016 FINDINGS: There is no evidence of thoracic spine fracture. Alignment is normal. No other significant bone abnormalities are identified. IMPRESSION: Negative. Electronically Signed   By: Lorriane Shire M.D.   On: 11/17/2017 11:51   Dg Humerus Left  Result Date: 11/17/2017 CLINICAL DATA:  Left upper arm pain after frequent falls. EXAM: LEFT HUMERUS - 2+ VIEW COMPARISON:  None. FINDINGS: There is no evidence of fracture or other focal bone lesions. No dislocation. Osteopenia. Slight arthritic changes at the elbow joint. Left radial head prosthesis in place. IMPRESSION: No acute abnormalities. Electronically Signed   By: Lorriane Shire M.D.   On: 11/17/2017 11:46    ____________________________________________  INITIAL IMPRESSION / ASSESSMENT AND PLAN / ED COURSE  Basically a mechanical fall she just rolled out of bed.  Will x-ray affected body parts.  No blood thinners, no indication for head imaging. Pain controlled w/ ibuprofen  No broken bones.  Suspect a soft tissue injuries.  Stable for discharge.  Pertinent labs & imaging results that were available during my care of the patient were reviewed by me and considered in my medical decision making (see chart for details).  ____________________________________________  FINAL CLINICAL IMPRESSION(S) / ED DIAGNOSES  Final diagnoses:  Fall, initial encounter     MEDICATIONS GIVEN DURING THIS VISIT:  Medications - No data to display   NEW OUTPATIENT MEDICATIONS STARTED DURING THIS VISIT:  New Prescriptions   No medications on file    Note:  This note was prepared with assistance of Dragon voice recognition software. Occasional wrong-word or sound-a-like substitutions may have occurred due to the inherent limitations of voice recognition software.   Merrily Pew, MD 11/17/17 1453

## 2017-11-17 NOTE — ED Triage Notes (Addendum)
Pt reports she fell out of bed and got stuck between her bed and her wheelchair.  Pt c/o pain in neck and left arm.  Pt resident of [facility].  EMS reports pt has a stimulator in her brain to help with her speech.

## 2017-11-25 ENCOUNTER — Ambulatory Visit: Payer: Self-pay | Admitting: Internal Medicine

## 2017-12-16 ENCOUNTER — Encounter: Payer: Self-pay | Admitting: Nurse Practitioner

## 2017-12-16 ENCOUNTER — Ambulatory Visit (INDEPENDENT_AMBULATORY_CARE_PROVIDER_SITE_OTHER): Payer: Medicare Other | Admitting: Nurse Practitioner

## 2017-12-16 VITALS — BP 102/65 | HR 78 | Temp 97.5°F | Ht 65.0 in | Wt 185.0 lb

## 2017-12-16 DIAGNOSIS — K648 Other hemorrhoids: Secondary | ICD-10-CM

## 2017-12-16 DIAGNOSIS — K5903 Drug induced constipation: Secondary | ICD-10-CM

## 2017-12-16 DIAGNOSIS — K219 Gastro-esophageal reflux disease without esophagitis: Secondary | ICD-10-CM | POA: Diagnosis not present

## 2017-12-16 DIAGNOSIS — K222 Esophageal obstruction: Secondary | ICD-10-CM | POA: Diagnosis not present

## 2017-12-16 NOTE — Assessment & Plan Note (Signed)
Likely drug-induced constipation.  She is currently on Amitiza.  She takes lactulose and, on the rare occasion she needs it, milk of magnesia.  These tend to control her constipation well.  No symptoms that she takes her medications.  Recommend she continue her current medications and follow-up in 6 months.

## 2017-12-16 NOTE — Assessment & Plan Note (Signed)
Known hemorrhoids which are symptomatic if she has straining on the rare occasion.  She was not aware of Anusol available to her.  I recommended she ask for Anusol if she has hemorrhoid symptoms which would help her hemorrhoids.  Continue to monitor, follow-up in 6 months.

## 2017-12-16 NOTE — Assessment & Plan Note (Signed)
History of GERD.  Overall her GERD symptoms are doing well.  She only has breakthrough symptoms if certain people cook and they tend to add spicy ingredients to food.  Recommended she avoid all spicy and trigger foods.  Continue current medications.  Follow-up in 6 months.

## 2017-12-16 NOTE — Progress Notes (Signed)
Referring Provider: Abran Richard, MD Primary Care Physician:  Billie Ruddy, MD Primary GI:  Dr. Gala Romney  Chief Complaint  Patient presents with  . Gastroesophageal Reflux  . Dysphagia  . Hemorrhoids  . Constipation    takes pain med    HPI:   [name] is a 67 y.o. female who presents for follow-up on constipation and GERD.  The patient also complains of dysphasia today.  Patient was last seen in our office 06/16/2017 for the same as well as hemorrhoids.  Has a chronic history of GERD, constipation, esophageal stenosis.  Constipation is drug-induced due to narcotics.  Previously recommended Movantik which is not covered by pharmacy so Amitiza 24 mcg twice daily was sent instead.  EGD last completed 05/06/2017 which found normal esophagus status post dilation, small hiatal hernia, normal duodenum.  Recommended follow-up in 3 months and continue current medications.  At her last visit she was doing well with GERD symptoms much improved on Protonix, constipation much improved on Amitiza with a bowel movement about every day and rare straining.  In the rare instance that she does have to strain she will have a hemorrhoid flare.  Does not have any topical therapy for hemorrhoids.  No other GI symptoms.  Recommended continue current medications, Anusol cream sent to pharmacy, follow-up in 6 months.  Today she states she's doing ok overall. GERD is doing "so-so." Has GERD symptoms as often as 3 times a week, depending on "who cooks and if they put hot stuff in the food." If no spicy foods, then she doesn't have a problem. Eating yogurt has helped. Constipation is "an issue" because she takes Hydrocodone. She uses Lactulose and that tends to solve the problem and if that doesn't help she will use Milk of Magnesia. Still on Amitiza. Eats fiber foods as well. Her hemorrhoids are still there but not burning much, unless she has straining. She was unaware she had Anusol available at the  facility. Dysphagia has been an issue, has intermittent spasms of the esophagus, sometimes not often and sometimes an issue. Foods that typically get hung are potato chips. No problems with meats or breads. She will let the chips get soft enough to pass. Her upper teeth have been removed. Is awaiting dentist to do impressions for dentures. Denies abdominal pain, N/V, hematochezia, melena. Denies chest pain, dyspnea, dizziness, lightheadedness, syncope, near syncope. Denies any other upper or lower GI symptoms.  Past Medical History:  Diagnosis Date  . Allergic rhinitis    uses Nasonex daily as needed  . Allergy to angiotensin receptor blockers (ARB) 01/22/2014   Angioedema  . Anxiety    takes Ativan daily as needed  . Aspiration pneumonia ([place])   . Asthma    Albuterol inhaler and Neb daily as needed  . 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 [name], residual palpitations  . Bipolar 1 disorder ([place])   . Chronic back pain    reason unknown  . Constipation   . COPD (chronic obstructive pulmonary disease) ([place])   . DDD (degenerative disc disease), lumbar   . Depression    takes Zoloft daily as well as Cymbalta  . Diabetes mellitus    takes NOvolin daily  . Dysphagia   . Dysrhythmia    SVT ==ablation done by Dr. Rayann Heman 2007  . Esophageal reflux    takes Zantac daily  . Frequent headaches   . History of bronchitis  Dec 2014  . Hypertension    takes Losartan daily  . Hypothyroidism    takes Synthroid daily  . Joint pain   . Joint swelling    left ankle  . Low sodium syndrome   . Lumbar radiculopathy   . Seizures ([place]) 04-05-13   one time ran out of Primidone and had stopped it cold [place]  . SVT (supraventricular tachycardia) ([place])   . Tremor, essential    takes Mysoline and Neurontin daily.  . Vocal cord dysfunction   . Weakness    in left arm    Past Surgical History:  Procedure Laterality Date  . ABDOMINAL HYSTERECTOMY      . ABLATION FOR AVNRT    . APPENDECTOMY    . BARTHOLIN GLAND CYST EXCISION     x 2  . BIOPSY  03/04/2015   Procedure: BIOPSY (Duodenal, Gastric);  Surgeon: Daneil Dolin, MD;  Location: [facility];  Service: Endoscopy;;  . BREAST SURGERY    . CESAREAN SECTION    . CHOLECYSTECTOMY    . COLONOSCOPY  01/16/2004   PYP:[id number] rectum/colon  . COLONOSCOPY WITH PROPOFOL N/A 03/04/2015   Dr.[name]- internal hemorrhoids, pancolonic diverticulosis, colonic polyp= tubular adenoma  . DEEP BRAIN STIMULATOR PLACEMENT    . DENTAL SURGERY    . ESOPHAGEAL DILATION N/A 03/04/2015   Procedure: ESOPHAGEAL DILATION WITH 54FR MALONEY DILATOR;  Surgeon: Daneil Dolin, MD;  Location: [facility];  Service: Endoscopy;  Laterality: N/A;  . ESOPHAGOGASTRODUODENOSCOPY (EGD) WITH ESOPHAGEAL DILATION  04/03/2002   IZT:[id number] ring, otherwise normal esophagus, status post dilation with 56 F/Normal stomach  . ESOPHAGOGASTRODUODENOSCOPY (EGD) WITH ESOPHAGEAL DILATION N/A 11/08/2012   Dr. [name]:schatzki's ring s/p dilation/hiatal hernia  . ESOPHAGOGASTRODUODENOSCOPY (EGD) WITH PROPOFOL N/A 03/04/2015   Dr.[name]- schatzki's ring, hiatal hernia, scattered erosions bx= chronic inactive gastritis  . ESOPHAGOGASTRODUODENOSCOPY (EGD) WITH PROPOFOL N/A 05/06/2017   Procedure: ESOPHAGOGASTRODUODENOSCOPY (EGD) WITH PROPOFOL;  Surgeon: Daneil Dolin, MD;  [facility];  Service: Endoscopy;  Laterality: N/A;  10:15am  . FINGER SURGERY    . HEMORRHOID BANDING  2017   Dr.[name]  . LEFT ANKLE LIGAMENT REPAIR     x 2  . LEFT ELBOW REPAIR    . MALONEY DILATION N/A 05/06/2017   Procedure: Venia Minks DILATION;  Surgeon: Daneil Dolin, MD;  [facility];  Service: Endoscopy;  Laterality: N/A;  . MAXIMUM ACCESS (MAS)POSTERIOR LUMBAR INTERBODY FUSION (PLIF) 1 LEVEL N/A 04/24/2014   Procedure: Lumbar four-five Maximum access Surgery posterior lumbar interbody fusion;  Surgeon: [name], MD;  Location: [facility];   Service: Neurosurgery;  Laterality: N/A;  Lumbar four-five Maximum access Surgery posterior lumbar interbody fusion  . MULTIPLE EXTRACTIONS WITH ALVEOLOPLASTY N/A 06/18/2017   Procedure: MULTIPLE EXTRACTION;  Surgeon: Diona Browner, DDS;  Location: [facility];  Service: Oral Surgery;  Laterality: N/A;  . PARTIAL HYSTERECTOMY    . POLYPECTOMY  03/04/2015   Procedure: POLYPECTOMY (descending colon);  Surgeon: Daneil Dolin, MD;  Location: [facility];  Service: Endoscopy;;  . PULSE GENERATOR IMPLANT Bilateral 12/15/2013   Procedure: BILATERAL PULSE GENERATOR IMPLANT;  Surgeon: [name], MD;  Location: [facility];  Service: Neurosurgery;  Laterality: Bilateral;  BILATERAL  . RIGHT BREAST CYST     benign  . SUBTHALAMIC STIMULATOR INSERTION Bilateral 12/04/2013   Procedure: Bilateral Deep brain stimulator placement;  Surgeon: [name], MD;  Location: [facility];  Service: Neurosurgery;  Laterality: Bilateral;  Bilateral Deep brain stimulator placement  .  TONSILLECTOMY      Current Outpatient Medications  Medication Sig Dispense Refill  . acetaminophen (TYLENOL) 325 MG tablet Take 650 mg by mouth 3 (three) times daily.    Marland Kitchen albuterol (PROAIR HFA) 108 (90 Base) MCG/ACT inhaler Inhale 2 puffs every 6 hours for asthma - rescue inhaler (Patient taking differently: Inhale 2 puffs into the lungs every 6 (six) hours. ) 1 Inhaler 12  . albuterol (PROVENTIL) (2.5 MG/3ML) 0.083% nebulizer solution Take 2.5 mg by nebulization every 6 (six) hours as needed for wheezing or shortness of breath.    Marland Kitchen aspirin 81 MG chewable tablet Chew 81 mg by mouth daily. Not taking until after Monday 07/12/17 due to surgery on her trigger finger    . baclofen (LIORESAL) 10 MG tablet Take 10 mg by mouth at bedtime.    . diphenhydrAMINE (BENADRYL) 25 mg capsule Take 25 mg by mouth 2 (two) times daily as needed for itching or allergies.    Marland Kitchen divalproex (DEPAKOTE) 250 MG DR tablet Take 250 mg by mouth 2 (two) times daily.    .  divalproex (DEPAKOTE) 500 MG DR tablet Take 500 mg by mouth 2 (two) times daily.    Marland Kitchen docusate sodium (COLACE) 100 MG capsule Take 1 capsule (100 mg total) by mouth daily. 30 capsule 0  . fluticasone furoate-vilanterol (BREO ELLIPTA) 100-25 MCG/INH AEPB Inhale 1 puff into the lungs daily. 1 each 11  . gabapentin (NEURONTIN) 300 MG capsule Take 300-600 mg by mouth See admin instructions. Take 300 mg in the morning and 600 mg at bedtime    . Glucose-Vitamin C-Vitamin D (TRUEPLUS GLUCOSE) CHEW Chew by mouth as needed.    Marland Kitchen guaifenesin (ROBITUSSIN) 100 MG/5ML syrup Take 200 mg by mouth every 6 (six) hours as needed for cough.    Marland Kitchen HYDROcodone-acetaminophen (NORCO/VICODIN) 5-325 MG tablet Take 1 tablet by mouth 3 (three) times daily.    . hydrocortisone (PROCTO-PAK) 1 % CREA Apply 1 application topically 2 (two) times daily as needed (hemorrhoid flare). For up to 10 days at a time 1 Tube 11  . ibuprofen (ADVIL,MOTRIN) 200 MG tablet Take 200 mg by mouth every 6 (six) hours as needed.    . insulin glargine (LANTUS) 100 UNIT/ML injection Inject 25 Units into the skin at bedtime.     Marland Kitchen ipratropium (ATROVENT) 0.03 % nasal spray 1-2 puffs each nostril 3 times daily, before meals (Patient taking differently: Place 1-2 sprays into both nostrils 3 (three) times daily. ) 30 mL 11  . lactulose (CHRONULAC) 10 GM/15ML solution Take 10 g by mouth daily.    Marland Kitchen levothyroxine (SYNTHROID, LEVOTHROID) 75 MCG tablet TAKE (1) TABLET BY MOUTH ONCE DAILY BEFORE BREAKFAST. 30 tablet 5  . loperamide (IMODIUM A-D) 2 MG tablet Take 2 mg by mouth 4 (four) times daily as needed for diarrhea or loose stools.     . lubiprostone (AMITIZA) 24 MCG capsule Take 1 capsule (24 mcg total) by mouth 2 (two) times daily with a meal. 60 capsule 3  . magnesium hydroxide (MILK OF MAGNESIA) 400 MG/5ML suspension Take 30 mLs by mouth at bedtime as needed for mild constipation.     . Menthol, Topical Analgesic, (BIOFREEZE) 4 % GEL Apply 1 application  topically 2 (two) times daily. Apply to lower back and base of right thumb twice daily.    . metoprolol succinate (TOPROL-XL) 50 MG 24 hr tablet Take 50 mg by mouth daily. Take with or immediately following a meal.    .  mirabegron ER (MYRBETRIQ) 25 MG TB24 tablet Take 25 mg by mouth daily.    . Multiple Vitamins-Minerals (MULTIVITAMINS THER. W/MINERALS) TABS tablet TAKE ONE TABLET BY MOUTH ONCE DAILY. 30 each 11  . NOVOLOG FLEXPEN 100 UNIT/ML FlexPen Inject 8 Units into the skin 3 (three) times daily before meals.     . Omega 3 1000 MG CAPS TAKE (1) CAPSULE BY MOUTH ONCE DAILY. 30 capsule 11  . pantoprazole (PROTONIX) 40 MG tablet Take 40 mg by mouth daily.    . polyethylene glycol (MIRALAX / GLYCOLAX) packet Take 17 g by mouth daily as needed for moderate constipation. 14 each 0  . potassium chloride SA (K-DUR,KLOR-CON) 20 MEQ tablet Take 20 mEq by mouth daily.    . primidone (MYSOLINE) 50 MG tablet Take 2 tablets (100 mg total) by mouth 2 (two) times daily. (Patient taking differently: Take 150 mg by mouth 3 (three) times daily. ) 360 tablet 1  . QUEtiapine (SEROQUEL) 100 MG tablet Take 100 mg by mouth at bedtime.    . simvastatin (ZOCOR) 5 MG tablet Take 1 tablet by mouth daily.    . sitaGLIPtin (JANUVIA) 100 MG tablet Take 100 mg by mouth daily.    . sodium chloride (OCEAN) 0.65 % SOLN nasal spray Take 1-2 sprays in nostril every hour as needed for rhinitis 30 mL PRN  . sodium chloride 1 g tablet Take 1 g by mouth 2 (two) times daily.    . sucralfate (CARAFATE) 1 g tablet Take 1 g by mouth 2 (two) times daily.     Marland Kitchen triamterene-hydrochlorothiazide (MAXZIDE-25) 37.5-25 MG tablet Take 1 tablet by mouth daily.    Marland Kitchen UNABLE TO FIND Please provide patient with lactose free milk. 1 Product 0  . azithromycin (ZITHROMAX) 250 MG tablet 2 today then one daily (Patient not taking: Reported on 12/16/2017) 6 tablet 0  . GENERLAC 10 GM/15ML SOLN     . naloxegol oxalate (MOVANTIK) 25 MG TABS tablet Take 25 mg  by mouth daily.     No current facility-administered medications for this visit.     Allergies as of 12/16/2017 - Review Complete 12/16/2017  Allergen Reaction Noted  . Invokana [canagliflozin] Other (See Comments) 12/05/2014  . Losartan Other (See Comments) 01/22/2014  . Metformin and related Diarrhea 01/18/2013  . Latex Other (See Comments) 07/02/2014  . Other Other (See Comments) 07/02/2014  . Oxycodone-acetaminophen Itching and Other (See Comments) 05/11/2011  . Mucinex [guaifenesin er] Other (See Comments)     Family History  Problem Relation Age of Onset  . Lung cancer Father        DIED AGE 67 LUNG CA  . Alcohol abuse Father   . Anxiety disorder Father   . Depression Father   . COPD Mother        DIED AGE 84,MRSA,COPD,PNEUMONIA  . Pneumonia Mother        DIED AGE 84,MRSA,COPD,PNEUMONIA  . Supraventricular tachycardia Mother   . Anxiety disorder Mother   . Depression Mother   . Alcohol abuse Mother   . Heart disease Sister        DIED AGE 56, SMOKER,?HEART  . COPD Brother        AGE 13  . Colon cancer Neg Hx     Social History   Socioeconomic History  . Marital status: Legally Separated    Spouse name: Not on file  . Number of children: Not on file  . Years of education: Not on file  . Highest  education level: Bachelor's degree (e.g., BA, AB, BS)  Occupational History  . Occupation: retired  Scientific laboratory technician  . Financial resource strain: Not on file  . Food insecurity:    Worry: Not on file    Inability: Not on file  . Transportation needs:    Medical: Not on file    Non-medical: Not on file  Tobacco Use  . Smoking status: Former Smoker    Packs/day: 0.30    Years: 10.00    Pack years: 3.00    Types: Cigarettes    Last attempt to quit: 04/23/1993    Years since quitting: 24.6  . Smokeless tobacco: Never Used  . Tobacco comment: quit smoking 25+yrs ago  Substance and Sexual Activity  . Alcohol use: No    Alcohol/week: 0.0 standard drinks  . Drug  use: No  . Sexual activity: Not Currently  Lifestyle  . Physical activity:    Days per week: Not on file    Minutes per session: Not on file  . Stress: Not on file  Relationships  . Social connections:    Talks on phone: Not on file    Gets together: Not on file    Attends religious service: Not on file    Active member of club or organization: Not on file    Attends meetings of clubs or organizations: Not on file    Relationship status: Not on file  Other Topics Concern  . Not on file  Social History Narrative   Married, 1 daughte, living.  Lives with husband in [place], [place].   1 child died brain tumor at age 18.   Two grandchildren.   Works at [employer], patient currently lost her job.   Retired 2011.   No tobacco.   Alcohol: none in 30 yrs.  Distant history of heavy use.   No drug use.    Review of Systems: General: Negative for anorexia, weight loss, fever, chills, fatigue, weakness. ENT: Negative for hoarseness, difficulty swallowing. CV: Negative for chest pain, angina, palpitations, peripheral edema.  Respiratory: Negative for dyspnea at rest, cough, sputum, wheezing.  GI: See history of present illness. MS: Negative for joint pain, low back pain.  Derm: Negative for rash or itching.  Endo: Negative for unusual weight change.  Heme: Negative for bruising or bleeding. Allergy: Negative for rash or hives.   Physical Exam: BP 102/65   Pulse 78   Temp (!) 97.5 F (36.4 C) (Oral)   Ht 5\' 5"  (1.651 m)   Wt 185 lb (83.9 kg) Comment: pt stated  BMI 30.79 kg/m  General:   Alert and oriented. Pleasant and cooperative. Well-nourished and well-developed. Sitting in wheelchair. Speech slurred chronically  Eyes:  Without icterus, sclera clear and conjunctiva pink.  Ears:  Normal auditory acuity. Cardiovascular:  S1, S2 present without murmurs appreciated. Extremities without clubbing or edema. Respiratory:  Clear to auscultation bilaterally. No wheezes, rales,  or rhonchi. No distress.  Gastrointestinal:  +BS, soft, non-tender and non-distended. No HSM noted. No guarding or rebound. No masses appreciated.  Rectal:  Deferred  Musculoskalatal:  Symmetrical without gross deformities. Neurologic:  Alert and oriented x4;  grossly normal neurologically. Psych:  Alert and cooperative. Normal mood and affect. Heme/Lymph/Immune: No excessive bruising noted.    12/16/2017 11:21 AM   Disclaimer: This note was dictated with voice recognition software. Similar sounding words can inadvertently be transcribed and may not be corrected upon review.

## 2017-12-16 NOTE — Assessment & Plan Note (Signed)
History of dysphasia.  She rarely has symptoms currently.  Typically her only symptoms occur when she eats potato chips.  I recommend she try to crush these into small pieces and drink plenty of fluids with them if she is going to eat them.  No other symptoms otherwise.  Continue to monitor, follow-up in 6 months.

## 2017-12-16 NOTE — Patient Instructions (Signed)
1. Continue your current medications. 2. If you are going to eat potato chips question up into small pieces and make sure you are drinking plenty of fluids when you eat them. 3. If you have hemorrhoid symptoms you can ask for the Anusol cream to help. 4. Try to avoid spicy foods as much as possible as this seems to make your heartburn worse. 5. Return for follow-up in 6 months. 6. Call us if you have any problems before then.  At [facility] we value your feedback. You may receive a survey about your visit today. Please share your experience as we strive to create trusting relationships with our patients to provide genuine, compassionate, quality care.  We appreciate your understanding and patience as we review any laboratory studies, imaging, and other diagnostic tests that are ordered as we care for you. Our office policy is 5 business days for review of these results, and any emergent or urgent results are addressed in a timely manner for your best interest. If you do not hear from our office in 1 week, please contact us.   We also encourage the use of MyChart, which contains your medical information for your review as well. If you are not enrolled in this feature, an access code is on this after visit summary for your convenience. Thank you for allowing [place] to be involved in your care.  It was great to see you today!  I hope you have a great summer!!

## 2017-12-16 NOTE — Progress Notes (Signed)
cc'ed to pcp °

## 2017-12-17 ENCOUNTER — Ambulatory Visit (INDEPENDENT_AMBULATORY_CARE_PROVIDER_SITE_OTHER): Payer: Medicare Other | Admitting: Podiatry

## 2017-12-17 ENCOUNTER — Encounter: Payer: Self-pay | Admitting: Podiatry

## 2017-12-17 DIAGNOSIS — M79675 Pain in left toe(s): Secondary | ICD-10-CM | POA: Diagnosis not present

## 2017-12-17 DIAGNOSIS — M79674 Pain in right toe(s): Secondary | ICD-10-CM

## 2017-12-17 DIAGNOSIS — B351 Tinea unguium: Secondary | ICD-10-CM

## 2017-12-18 NOTE — Progress Notes (Signed)
Subjective:   Patient ID: [name], female   DOB: 67 y.o.   MRN: [id number]   HPI Patient presents with painful nailbeds 1-5 both feet that are thick and she cannot cut that she is in wheelchair.  She also has a small abrasion on her right ankle that is being treated with local wound care at the facility that she is at and is not giving her problems   ROS      Objective:  Physical Exam  Neurovascular status unchanged with thick yellow brittle nailbeds 1-5 both feet and small abrasion right lateral ankle that is localized with no drainage noted     Assessment:  Mycotic nail infection with pain 1-5 both feet and small abrasion right ankle     Plan:  H&P debrided nailbeds 1-5 both feet with no iatrogenic bleeding and utilized Neosporin on the area which she will continue to do at home that should heal uneventfully.  Patient will be seen back for routine care or earlier if any issues should occur

## 2017-12-24 ENCOUNTER — Telehealth: Payer: Self-pay | Admitting: *Deleted

## 2017-12-24 NOTE — Telephone Encounter (Signed)
[facility] asked if our doctors ordered clincamycin for pt and I reviewed Meds & Orders and med history and we did not.

## 2017-12-24 NOTE — Telephone Encounter (Signed)
[facility] called states he needs to speak to someone about a resident, but did not leave a name.

## 2017-12-28 ENCOUNTER — Ambulatory Visit (INDEPENDENT_AMBULATORY_CARE_PROVIDER_SITE_OTHER): Payer: Medicare Other | Admitting: Neurology

## 2017-12-28 ENCOUNTER — Encounter

## 2017-12-28 ENCOUNTER — Encounter: Payer: Self-pay | Admitting: Neurology

## 2017-12-28 VITALS — BP 100/64 | HR 80

## 2017-12-28 DIAGNOSIS — R2681 Unsteadiness on feet: Secondary | ICD-10-CM

## 2017-12-28 DIAGNOSIS — G8929 Other chronic pain: Secondary | ICD-10-CM

## 2017-12-28 DIAGNOSIS — G250 Essential tremor: Secondary | ICD-10-CM

## 2017-12-28 DIAGNOSIS — M545 Low back pain: Secondary | ICD-10-CM

## 2017-12-28 NOTE — Progress Notes (Signed)
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 (PLIF) 1 LEVEL N/A 04/24/2014   Procedure: Lumbar four-five Maximum access Surgery posterior lumbar interbody fusion;  Surgeon: [name], MD;  Location: [facility];  Service: Neurosurgery;  Laterality: N/A;  Lumbar four-five Maximum access Surgery posterior lumbar interbody fusion  . MULTIPLE EXTRACTIONS WITH ALVEOLOPLASTY N/A 06/18/2017   Procedure: MULTIPLE EXTRACTION;  Surgeon: Diona Browner, DDS;  Location: [facility];  Service: Oral Surgery;  Laterality: N/A;  . PARTIAL HYSTERECTOMY    . POLYPECTOMY  03/04/2015   Procedure: POLYPECTOMY (descending colon);  Surgeon: Daneil Dolin, MD;  Location: [facility];  Service: Endoscopy;;  . PULSE GENERATOR IMPLANT Bilateral 12/15/2013   Procedure: BILATERAL PULSE GENERATOR IMPLANT;  Surgeon: [name], MD;  Location: [facility];  Service: Neurosurgery;  Laterality: Bilateral;  BILATERAL  . RIGHT BREAST CYST     benign  . SUBTHALAMIC STIMULATOR INSERTION Bilateral 12/04/2013   Procedure: Bilateral Deep brain stimulator placement;  Surgeon: [name], MD;  Location: [facility];  Service: Neurosurgery;  Laterality: Bilateral;  Bilateral Deep brain stimulator placement  . TONSILLECTOMY      Social History   Socioeconomic History  . Marital status: Legally Separated    Spouse name: Not on file  . Number of children: Not on file  . Years of education: Not on file  . Highest education level: Bachelor's degree (e.g., BA, AB, BS)  Occupational History  . Occupation: retired  Scientific laboratory technician  . Financial resource strain: Not  on file  . Food insecurity:  Worry: Not on file    Inability: Not on file  . Transportation needs:    Medical: Not on file    Non-medical: Not on file  Tobacco Use  . Smoking status: Former Smoker    Packs/day: 0.30    Years: 10.00    Pack years: 3.00    Types: Cigarettes    Last attempt to quit: 04/23/1993    Years since quitting: 24.6  . Smokeless tobacco: Never Used  . Tobacco comment: quit smoking 25+yrs ago  Substance and Sexual Activity  . Alcohol use: No    Alcohol/week: 0.0 standard drinks  . Drug use: No  . Sexual activity: Not Currently  Lifestyle  . Physical activity:    Days per week: Not on file    Minutes per session: Not on file  . Stress: Not on file  Relationships  . Social connections:    Talks on phone: Not on file    Gets together: Not on file    Attends religious service: Not on file    Active member of club or organization: Not on file    Attends meetings of clubs or organizations: Not on file    Relationship status: Not on file  . Intimate partner violence:    Fear of current or ex partner: Not on file    Emotionally abused: Not on file    Physically abused: Not on file    Forced sexual activity: Not on file  Other Topics Concern  . Not on file  Social History Narrative   Married, 1 daughte, living.  Lives with husband in [place], [place].   1 child died brain tumor at age 56.   Two grandchildren.   Works at [employer], patient currently lost her job.   Retired 2011.   No tobacco.   Alcohol: none in 30 yrs.  Distant history of heavy use.   No drug use.    Family Status  Relation Name Status  . Father  Deceased       lung cancer, diabetes  . Mother  Deceased       COPD, diabetes  . Son  Deceased at age 41       brain tumor (neuroblastoma)  . Sister  Deceased       blood clot  . Daughter  Alive       healthy  . Brother  (Not Specified)  . Neg Hx  (Not Specified)    Review of Systems Review of Systems  Constitutional:  Negative.   HENT: Negative.   Eyes: Negative.   Respiratory: Positive for shortness of breath (intermittent, had asthma attack this AM).   Cardiovascular: Negative.   Musculoskeletal: Positive for back pain.  Skin: Negative.   Neurological: Positive for tremors.      Objective:   VITALS:   Vitals:   12/28/17 1421  BP: 100/64  Pulse: 80  SpO2: 96%   Wt Readings from Last 3 Encounters:  12/16/17 185 lb (83.9 kg)  11/17/17 184 lb (83.5 kg)  10/15/17 184 lb (83.5 kg)      Gen: Appears stated age and in NAD.   HEENT: Normocephalic, AT.  MMM.  She is edentulous.   Cardiovascular: Regular rate and rhythm.  Lungs: clear today bilaterally Neck: There are no carotid bruits noted bilaterally.   NEUROLOGICAL:  Orientation: The patient is alert and oriented x 3.  Cranial nerves: There is good facial symmetry.  Speech is fluent and clear but has a pseudobulbar  quality. Soft palate rises symmetrically and there is no tongue deviation. Hearing is intact to conversational tone.  Tone: Tone is good throughout.  Sensation: Sensation is intact to light touch throughout. Coordination: The patient has no dysdiadichokinesia.   Motor: Strength is 5/5 in the UE.   Gait and Station: The patient is in her WC and opted not to ambulate today as she states that she really doesn't do that anymore   MOVEMENT EXAM:  Tremor: very little tremor of the L hand.  Mild to mod on the right but is an ataxic component to it.  DBS programming was performed today which is described in more detail in a separate programming procedural notes.  In brief, tremor was improved following programming   Assessment/Plan:   1.  Essential Tremor.  -This is evidenced by the symmetrical nature and longstanding hx of gradually getting worse.  -The patient is status post bilateral VIM DBS on 12/04/2013 with bilateral generator placement on 12/15/2013.  Postoperative imaging indicates that leads are perhaps just slightly too  lateral and more inferior than anticipated.  -I was worried that the device was contributing to mood change.  However, the device was turned off for 5 weeks and the facility reports that her mood was not only better, but that it was worse.  In addition, tremor was markedly worse and she had difficulty taking medication with water because of this.  I turned back on the device today and made adjustments, as described in the programming procedure note.   -decrease primidone to 1 tablet daily over the next 2 weeks  -she does have mild myoclonus now and discussed that this is likely from her gabapentin.  Discussed this with her today. 2.  Diabetic peripheral neuropathy  -on gabapentin with pain management.  Feels that neuropathy is getting worse and told her to discuss with pain management. 3.  L4 radiculopathy  -S/P surgery and back pain was better but has returned and on opioids still 4.  Multifactorial balance d/o  -needs to get up with assistance or walker at all times.    -due to ET, meds (esp narcotics), back pain, diabetic peripheral neuropathy 5.  F/u 5 months

## 2017-12-28 NOTE — Procedures (Signed)
DBS Programming was performed.    Total time spent programming was 25 minutes.  Device was confirmed to be on  Soft start was confirmed to be on.  Impedences were checked and were within normal limits.  Battery was checked and was determined to be functioning normally and not near the end of life (2.84 and 2.78).    Final settings were as follows, although the device was off:  Left brain electrode:     2-1+  ; Amplitude  3.8   V   ; Pulse width 90 microseconds;   Frequency   140   Hz.  Right brain electrode:     3-2+          ; Amplitude   3.7  V ;  Pulse width 90  microseconds;  Frequency   140    Hz.

## 2018-01-05 ENCOUNTER — Ambulatory Visit: Payer: Self-pay | Admitting: Primary Care

## 2018-01-11 ENCOUNTER — Ambulatory Visit (INDEPENDENT_AMBULATORY_CARE_PROVIDER_SITE_OTHER)
Admission: RE | Admit: 2018-01-11 | Discharge: 2018-01-11 | Disposition: A | Discharge status: Home / Self Care | Payer: Medicare Other | Source: Ambulatory Visit | Attending: Primary Care | Admitting: Primary Care

## 2018-01-11 ENCOUNTER — Encounter: Payer: Self-pay | Admitting: Primary Care

## 2018-01-11 ENCOUNTER — Ambulatory Visit (INDEPENDENT_AMBULATORY_CARE_PROVIDER_SITE_OTHER): Payer: Medicare Other | Admitting: Primary Care

## 2018-01-11 DIAGNOSIS — J4541 Moderate persistent asthma with (acute) exacerbation: Secondary | ICD-10-CM | POA: Diagnosis not present

## 2018-01-11 DIAGNOSIS — J209 Acute bronchitis, unspecified: Secondary | ICD-10-CM

## 2018-01-11 MED ORDER — PREDNISONE 10 MG PO TABS: ORAL_TABLET | ORAL | 0 refills | Status: DC

## 2018-01-11 MED ORDER — PROMETHAZINE-CODEINE 6.25-10 MG/5ML PO SYRP: 5.0000 mL | ORAL_SOLUTION | Freq: Four times a day (QID) | ORAL | 0 refills | Status: DC | PRN

## 2018-01-11 MED ORDER — FLUTTER DEVI: 0 refills | Status: DC

## 2018-01-11 MED ORDER — LEVALBUTEROL HCL 0.63 MG/3ML IN NEBU
0.6300 mg | INHALATION_SOLUTION | RESPIRATORY_TRACT | Status: AC
  Administered 2018-01-11: 0.63 mg via RESPIRATORY_TRACT

## 2018-01-11 NOTE — Progress Notes (Signed)
@Patient  ID: [name], female    DOB: 1950-12-25, 67 y.o.   MRN: [id number]  Chief Complaint  Patient presents with  . Acute Visit    wheezing, cough with yellow mucus and green plugs x2 weeks, SOB with exertion, pain in rib cage     Referring provider: Abran Richard, MD  HPI: 67 year old female, former smoker quit 1995. PMH hypertension, moderate persistent asthma, OSA, seasonal allergies, anxiety, GERD, DM2, tremor with deep brain stimulator. Patient of Dr. Annamaria Boots, last seen on 10/15/17 and treated for asthmatic bronchitis with z-pack and xopenex treatment. Lives in assisted living. Maintained on Breo 100, albuterol neb and proair hfa.   01/11/2018 Patient presents today for acute visit with complaints of asthma symptoms. Experiencing wheezing, sob and cough with yellow/green mucus x2 weeks. Continues Breo 1 puff daily as prescribed. She has required rescue inhaler/ albuterol neb 4-5 times within the past 2 weeks. Nebulizer helps. Patient requesting to see Dr. Annamaria Boots today, in room to speak with her.     Allergies  Allergen Reactions  . Invokana [Canagliflozin] Other (See Comments)    Yeast infectios  . Losartan Other (See Comments)    Angioedema   . Metformin And Related Diarrhea  . Latex Other (See Comments)    Other Reaction: redness, blisters  . Other Other (See Comments)    Air fresheners - on MAR  . Oxycodone-Acetaminophen Itching and Other (See Comments)    TYLOX. Caused internal itching per patient   . Mucinex [Guaifenesin Er] Other (See Comments)    MUCINEX REACTION: asthma attacks    Immunization History  Administered Date(s) Administered  . Influenza Split 12/17/2010, 02/16/2012, 12/27/2012, 11/29/2014  . Influenza Whole 12/16/2009  . Influenza, High Dose Seasonal PF 12/11/2016  . Influenza,inj,Quad PF,6+ Mos 01/18/2013  . Influenza-Unspecified 12/19/2013, 11/30/2015  . PPD Test 11/21/2014  . Pneumococcal Conjugate-13 03/02/2014  . Pneumococcal  Polysaccharide-23 02/08/2004, 04/06/2010  . Tdap 11/21/2008  . Zoster 02/12/2014, 11/28/2014    Past Medical History:  Diagnosis Date  . Allergic rhinitis    uses Nasonex daily as needed  . Allergy to angiotensin receptor blockers (ARB) 01/22/2014   Angioedema  . Anxiety    takes Ativan daily as needed  . Aspiration pneumonia ([place])   . Asthma    Albuterol inhaler and Neb daily as needed  . 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 [name], residual palpitations  . Bipolar 1 disorder ([place])   . Chronic back pain    reason unknown  . Constipation   . COPD (chronic obstructive pulmonary disease) ([place])   . DDD (degenerative disc disease), lumbar   . Depression    takes Zoloft daily as well as Cymbalta  . Diabetes mellitus    takes NOvolin daily  . Dysphagia   . Dysrhythmia    SVT ==ablation done by Dr. Rayann Heman 2007  . Esophageal reflux    takes Zantac daily  . Frequent headaches   . History of bronchitis Dec 2014  . Hypertension    takes Losartan daily  . Hypothyroidism    takes Synthroid daily  . Joint pain   . Joint swelling    left ankle  . Low sodium syndrome   . Lumbar radiculopathy   . Seizures ([place]) 04-05-13   one time ran out of Primidone and had stopped it cold [place]  . SVT (supraventricular tachycardia) ([place])   . Tremor, essential    takes  Mysoline and Neurontin daily.  . Vocal cord dysfunction   . Weakness    in left arm    Tobacco History: Social History   Tobacco Use  Smoking Status Former Smoker  . Packs/day: 0.30  . Years: 10.00  . Pack years: 3.00  . Types: Cigarettes  . Last attempt to quit: 04/23/1993  . Years since quitting: 24.7  Smokeless Tobacco Never Used  Tobacco Comment   quit smoking 25+yrs ago   Counseling given: Not Answered Comment: quit smoking 25+yrs ago   Outpatient Medications Prior to Visit  Medication Sig Dispense Refill  . albuterol (PROAIR HFA) 108 (90 Base) MCG/ACT  inhaler Inhale 2 puffs every 6 hours for asthma - rescue inhaler (Patient taking differently: Inhale 2 puffs into the lungs every 6 (six) hours. ) 1 Inhaler 12  . albuterol (PROVENTIL) (2.5 MG/3ML) 0.083% nebulizer solution Take 2.5 mg by nebulization every 6 (six) hours as needed for wheezing or shortness of breath.    Marland Kitchen aspirin 81 MG chewable tablet Chew 81 mg by mouth daily. Not taking until after Monday 07/12/17 due to surgery on her trigger finger    . baclofen (LIORESAL) 10 MG tablet Take 10 mg by mouth at bedtime.    . diphenhydrAMINE (BENADRYL) 25 mg capsule Take 25 mg by mouth 2 (two) times daily as needed for itching or allergies.    Marland Kitchen divalproex (DEPAKOTE) 250 MG DR tablet Take 250 mg by mouth daily. In the morning with 500 mg dose    . divalproex (DEPAKOTE) 500 MG DR tablet Take 500 mg by mouth 3 (three) times daily.     Marland Kitchen docusate sodium (COLACE) 100 MG capsule Take 1 capsule (100 mg total) by mouth daily. 30 capsule 0  . fesoterodine (TOVIAZ) 8 MG TB24 tablet Toviaz 8 mg tablet,extended release    . fluticasone furoate-vilanterol (BREO ELLIPTA) 100-25 MCG/INH AEPB Inhale 1 puff into the lungs daily. 1 each 11  . gabapentin (NEURONTIN) 300 MG capsule Take 300-600 mg by mouth See admin instructions. Take 300 mg in the morning and 600 mg at bedtime    . GENERLAC 10 GM/15ML SOLN     . Glucose-Vitamin C-Vitamin D (TRUEPLUS GLUCOSE) CHEW Chew by mouth as needed.    Marland Kitchen guaifenesin (ROBITUSSIN) 100 MG/5ML syrup Take 200 mg by mouth every 6 (six) hours as needed for cough.    Marland Kitchen HYDROcodone-acetaminophen (NORCO/VICODIN) 5-325 MG tablet Take 1 tablet by mouth 3 (three) times daily.    . hydrocortisone (PROCTO-PAK) 1 % CREA Apply 1 application topically 2 (two) times daily as needed (hemorrhoid flare). For up to 10 days at a time 1 Tube 11  . ibuprofen (ADVIL,MOTRIN) 200 MG tablet Take 200 mg by mouth every 6 (six) hours as needed.    . insulin glargine (LANTUS) 100 UNIT/ML injection Inject 25 Units  into the skin at bedtime.     Marland Kitchen ipratropium (ATROVENT) 0.03 % nasal spray 1-2 puffs each nostril 3 times daily, before meals (Patient taking differently: Place 1-2 sprays into both nostrils 3 (three) times daily. ) 30 mL 11  . lactulose (CHRONULAC) 10 GM/15ML solution Take 10 g by mouth daily.    Marland Kitchen lamoTRIgine (LAMICTAL) 100 MG tablet lamotrigine 100 mg tablet    . levothyroxine (SYNTHROID, LEVOTHROID) 75 MCG tablet TAKE (1) TABLET BY MOUTH ONCE DAILY BEFORE BREAKFAST. 30 tablet 5  . loperamide (IMODIUM A-D) 2 MG tablet Take 2 mg by mouth 4 (four) times daily as needed for diarrhea or  loose stools.     . lubiprostone (AMITIZA) 24 MCG capsule Take 1 capsule (24 mcg total) by mouth 2 (two) times daily with a meal. 60 capsule 3  . magnesium hydroxide (MILK OF MAGNESIA) 400 MG/5ML suspension Take 30 mLs by mouth at bedtime as needed for mild constipation.     . Menthol, Topical Analgesic, (BIOFREEZE) 4 % GEL Apply 1 application topically 2 (two) times daily. Apply to lower back and base of right thumb twice daily.    . metFORMIN (GLUCOPHAGE) 1000 MG tablet Take by mouth.    . metoprolol succinate (TOPROL-XL) 50 MG 24 hr tablet Take 50 mg by mouth daily. Take with or immediately following a meal.    . metoprolol tartrate (LOPRESSOR) 50 MG tablet metoprolol tartrate 50 mg tablet    . mirabegron ER (MYRBETRIQ) 25 MG TB24 tablet Take 25 mg by mouth daily.    . mometasone-formoterol (DULERA) 200-5 MCG/ACT AERO 2 puffs and 1 puff at night    . Multiple Vitamins-Minerals (MULTIVITAMINS THER. W/MINERALS) TABS tablet TAKE ONE TABLET BY MOUTH ONCE DAILY. 30 each 11  . naloxegol oxalate (MOVANTIK) 25 MG TABS tablet Take 25 mg by mouth daily.    Marland Kitchen NOVOLOG FLEXPEN 100 UNIT/ML FlexPen Inject 8 Units into the skin 3 (three) times daily before meals.     Marland Kitchen olmesartan-hydrochlorothiazide (BENICAR HCT) 40-25 MG tablet Take by mouth.    . Omega 3 1000 MG CAPS TAKE (1) CAPSULE BY MOUTH ONCE DAILY. 30 capsule 11  .  pantoprazole (PROTONIX) 40 MG tablet Take 40 mg by mouth daily.    . polyethylene glycol (MIRALAX / GLYCOLAX) packet Take 17 g by mouth daily as needed for moderate constipation. 14 each 0  . potassium chloride SA (K-DUR,KLOR-CON) 20 MEQ tablet Take 20 mEq by mouth daily.    . primidone (MYSOLINE) 50 MG tablet Take 2 tablets (100 mg total) by mouth 2 (two) times daily. (Patient taking differently: Take by mouth. 2 in the morning 1 at night) 360 tablet 1  . QUEtiapine (SEROQUEL) 100 MG tablet Take 100 mg by mouth at bedtime.    . simvastatin (ZOCOR) 5 MG tablet Take 1 tablet by mouth daily.    . sitaGLIPtin (JANUVIA) 100 MG tablet Take 100 mg by mouth daily.    . sodium chloride (OCEAN) 0.65 % SOLN nasal spray Take 1-2 sprays in nostril every hour as needed for rhinitis 30 mL PRN  . sodium chloride 1 g tablet Take 1 g by mouth 2 (two) times daily.    . sucralfate (CARAFATE) 1 g tablet Take 1 g by mouth 2 (two) times daily.     Marland Kitchen triamterene-hydrochlorothiazide (MAXZIDE-25) 37.5-25 MG tablet Take 1 tablet by mouth daily.    Marland Kitchen acetaminophen (TYLENOL) 325 MG tablet Take 650 mg by mouth 3 (three) times daily.     No facility-administered medications prior to visit.     Review of Systems  Review of Systems  Constitutional: Negative.   HENT: Positive for congestion.   Eyes: Negative.   Respiratory: Positive for cough, shortness of breath and wheezing. Negative for apnea, choking, chest tightness and stridor.   Cardiovascular: Negative.     Physical Exam  BP 140/76 (BP Location: Right Arm, Cuff Size: Normal)   Pulse (!) 113   Temp 98.9 F (37.2 C)   Ht 5\' 5"  (1.651 m)   Wt 182 lb 12.8 oz (82.9 kg)   SpO2 96%   BMI 30.42 kg/m  Physical Exam  Constitutional: She is oriented to person, place, and time. She appears well-developed and well-nourished.  HENT:  Head: Normocephalic and atraumatic.  Eyes: Pupils are equal, round, and reactive to light. EOM are normal.  Neck: Normal range of  motion. Neck supple.  Cardiovascular: Normal rate and regular rhythm.  Pulmonary/Chest: Effort normal. No respiratory distress. She has wheezes.  Scattered exp wheeze and rhonchi   Musculoskeletal:  In [facility]  Neurological: She is alert and oriented to person, place, and time.  +tremor   Skin: Skin is warm and dry.  Psychiatric: Her behavior is normal.     Lab Results:  CBC    Component Value Date/Time   WBC 6.1 09/30/2017 2325   RBC 3.85 (L) 09/30/2017 2325   HGB 12.5 09/30/2017 2325   HCT 35.6 (L) 09/30/2017 2325   PLT 167 09/30/2017 2325   MCV 92.5 09/30/2017 2325   MCH 32.5 09/30/2017 2325   MCHC 35.1 09/30/2017 2325   RDW 12.3 09/30/2017 2325   LYMPHSABS 2.7 09/30/2017 2325   MONOABS 0.6 09/30/2017 2325   EOSABS 0.2 09/30/2017 2325   BASOSABS 0.0 09/30/2017 2325    BMET    Component Value Date/Time   NA 126 (L) 09/30/2017 2325   K 3.8 09/30/2017 2325   CL 91 (L) 09/30/2017 2325   CO2 27 09/30/2017 2325   GLUCOSE 150 (H) 09/30/2017 2325   BUN 10 09/30/2017 2325   CREATININE 0.52 09/30/2017 2325   CREATININE 0.70 12/27/2012 1006   CALCIUM 8.7 (L) 09/30/2017 2325   GFRNONAA >60 09/30/2017 2325   GFRAA >60 09/30/2017 2325    BNP No results found for: BNP  ProBNP    Component Value Date/Time   PROBNP 411.3 (H) 12/04/2012 1659    Imaging: No results found.   Assessment & Plan:   Moderate persistent asthma with acute bronchitis and acute exacerbation - Acute mild asthma exacerbation with bronchitis - Needs prednisone 20mg  x 5days  - Received Xopenex treatment x1 today - Flutter valve three times a day  - Promethazine with codeine q 6 hours prn cough with safe precautions (patient has used before in the past) - Checking CXR, consider abx if needed  - FU in 1 week with Dr. Kaleen Mask, NP 01/11/2018

## 2018-01-11 NOTE — Patient Instructions (Addendum)
Testing: Checking CXR today  Office treatment: Given Xopenex neb treatment   Recommendations: Prednisone 20mg  x 5 days (sent to pharmacy) Use delsym cough syrup twice daily  Flutter valve three times a day   Uses Albuterol neb treatment every 6 hours as needed for wheezing/shortness of breath

## 2018-01-11 NOTE — Addendum Note (Signed)
Addended by: Karmen Stabs on: 01/11/2018 12:11 PM   Modules accepted: Orders

## 2018-01-11 NOTE — Addendum Note (Signed)
Addended by: Karmen Stabs on: 01/11/2018 10:46 AM   Modules accepted: Orders

## 2018-01-11 NOTE — Assessment & Plan Note (Addendum)
-   Acute mild asthma exacerbation with bronchitis - Needs prednisone 20mg  x 5days  - Received Xopenex treatment x1 today - Flutter valve three times a day  - Promethazine with codeine q 6 hours prn cough with safe precautions (patient has used before in the past) - Checking CXR, consider abx if needed  - FU in 1 week with Dr. Annamaria Boots

## 2018-01-12 ENCOUNTER — Telehealth: Payer: Self-pay | Admitting: Primary Care

## 2018-01-12 NOTE — Telephone Encounter (Signed)
From pt's OV with Derl Barrow, NP 01/11/18: Recommendations: Prednisone 20mg  x 5 days (sent to pharmacy) Use delsym cough syrup twice daily  Flutter valve three times a day   Uses Albuterol neb treatment every 6 hours as needed for wheezing/shortness of breath   meds that were sent to pt's pharmacy 01/11/18: -Prednisone 10mg  for pt to take 2 tabs x5 days -promethazine-codeine cough syrup for pt to take 11mls every 6 hours as needed for cough -flutter device for pt to use 3 times daily  [facility] and spoke with Alwyn Ren to verify the meds that were prescribed for pt. Also stated to Alwyn Ren that pt should have been sent home with an AVS that has all the written instructions on it from the office visit. Alwyn Ren stated she would check with pt to see if she has the AVS and if she needed anything else from [place] she would call us.  Nothing further needed.

## 2018-01-18 ENCOUNTER — Encounter: Payer: Self-pay | Admitting: Primary Care

## 2018-01-18 ENCOUNTER — Ambulatory Visit (INDEPENDENT_AMBULATORY_CARE_PROVIDER_SITE_OTHER): Payer: Medicare Other | Admitting: Primary Care

## 2018-01-18 DIAGNOSIS — J4541 Moderate persistent asthma with (acute) exacerbation: Secondary | ICD-10-CM | POA: Diagnosis not present

## 2018-01-18 DIAGNOSIS — J4540 Moderate persistent asthma, uncomplicated: Secondary | ICD-10-CM

## 2018-01-18 DIAGNOSIS — J209 Acute bronchitis, unspecified: Secondary | ICD-10-CM

## 2018-01-18 NOTE — Patient Instructions (Addendum)
Doing better Vital signs stable  Continue BREO once daily in morning  Use Flutter valve prn chest congestion Albuterol nebulizer every 6 hours as needed for sob/wheezing   D/c lactate free diet order  Ok to take PWC cough syrup 50ml every 6 hours prn cough (Do not combine with other pain medication. Confirmed with patient twice that she has taken in the past with no reaction)  Follow up in January with Dr. Annamaria Boots, or sooner if symptoms return or worsen

## 2018-01-18 NOTE — Assessment & Plan Note (Signed)
Continue BREO once daily in morning  Use Flutter valve prn chest congestion Albuterol nebulizer every 6 hours as needed for sob/wheezing

## 2018-01-18 NOTE — Assessment & Plan Note (Addendum)
-   Improved, completed prednisone course - CXR with no acute findings

## 2018-01-18 NOTE — Progress Notes (Signed)
@Patient  ID: [name], female    DOB: 1950/04/10, 67 y.o.   MRN: [id number]  Chief Complaint  Patient presents with  . Follow-up    Some SOB, using neb with improvement    Referring provider: Abran Richard, MD  HPI: 67 year old female, former smoker quit 1995. PMH hypertension, moderate persistent asthma, OSA, seasonal allergies, anxiety, GERD, DM2, tremor with deep brain stimulator. Patient of Dr. Annamaria Boots, last seen on 10/15/17 and treated for asthmatic bronchitis with z-pack and xopenex treatment. Lives in assisted living. Maintained on Breo 100, albuterol neb and proair hfa.   01/11/2018 Patient presents today for acute visit with complaints of asthma symptoms. Experiencing wheezing, sob and cough with yellow/green mucus x2 weeks. Continues Breo 1 puff daily as prescribed. She has required rescue inhaler/ albuterol neb 4-5 times within the past 2 weeks. Nebulizer helps. Patient requesting to see Dr. Annamaria Boots today, in room to speak with her. Given RX for prednisone x 5 days, flutter valve, albuterol neb q6hr prn and PWC.   01/18/2018 Patient presents today for 1 week follow-up. Feeling better, cough has improved. Finished prednisone course. Using flutter valve and nebulizer prn and thinks its helping. Denies sob. Baseline upper airway wheeze. Sinus drainage/running. Using nasal spray. Asking for lactate diet to be discontinued, she can eat cheese and yogurt. Requesting Dr. Annamaria Boots come in at the end of visit to see her.    Allergies  Allergen Reactions  . Invokana [Canagliflozin] Other (See Comments)    Yeast infectios  . Losartan Other (See Comments)    Angioedema   . Metformin And Related Diarrhea  . Latex Other (See Comments)    Other Reaction: redness, blisters  . Other Other (See Comments)    Air fresheners - on MAR  . Oxycodone-Acetaminophen Itching and Other (See Comments)    TYLOX. Caused internal itching per patient   . Mucinex [Guaifenesin Er] Other (See  Comments)    MUCINEX REACTION: asthma attacks    Immunization History  Administered Date(s) Administered  . Influenza Split 12/17/2010, 02/16/2012, 12/27/2012, 11/29/2014  . Influenza Whole 12/16/2009  . Influenza, High Dose Seasonal PF 12/11/2016  . Influenza,inj,Quad PF,6+ Mos 01/18/2013  . Influenza,inj,quad, With Preservative 01/04/2017  . Influenza-Unspecified 12/19/2013, 11/30/2015  . PPD Test 11/21/2014  . Pneumococcal Conjugate-13 03/02/2014  . Pneumococcal Polysaccharide-23 02/08/2004, 04/06/2010  . Tdap 11/21/2008  . Zoster 02/12/2014, 11/28/2014    Past Medical History:  Diagnosis Date  . Allergic rhinitis    uses Nasonex daily as needed  . Allergy to angiotensin receptor blockers (ARB) 01/22/2014   Angioedema  . Anxiety    takes Ativan daily as needed  . Aspiration pneumonia ([place])   . Asthma    Albuterol inhaler and Neb daily as needed  . 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 [name], residual palpitations  . Bipolar 1 disorder ([place])   . Chronic back pain    reason unknown  . Constipation   . COPD (chronic obstructive pulmonary disease) ([place])   . DDD (degenerative disc disease), lumbar   . Depression    takes Zoloft daily as well as Cymbalta  . Diabetes mellitus    takes NOvolin daily  . Dysphagia   . Dysrhythmia    SVT ==ablation done by Dr. Rayann Heman 2007  . Esophageal reflux    takes Zantac daily  . Frequent headaches   . History of bronchitis Dec 2014  . Hypertension  takes Losartan daily  . Hypothyroidism    takes Synthroid daily  . Joint pain   . Joint swelling    left ankle  . Low sodium syndrome   . Lumbar radiculopathy   . Seizures ([place]) 04-05-13   one time ran out of Primidone and had stopped it cold [place]  . SVT (supraventricular tachycardia) ([place])   . Tremor, essential    takes Mysoline and Neurontin daily.  . Vocal cord dysfunction   . Weakness    in left arm    Tobacco  History: Social History   Tobacco Use  Smoking Status Former Smoker  . Packs/day: 0.30  . Years: 10.00  . Pack years: 3.00  . Types: Cigarettes  . Last attempt to quit: 04/23/1993  . Years since quitting: 24.7  Smokeless Tobacco Never Used  Tobacco Comment   quit smoking 25+yrs ago   Counseling given: Not Answered Comment: quit smoking 25+yrs ago   Outpatient Medications Prior to Visit  Medication Sig Dispense Refill  . acetaminophen (TYLENOL) 325 MG tablet Take 650 mg by mouth 3 (three) times daily.    Marland Kitchen albuterol (PROAIR HFA) 108 (90 Base) MCG/ACT inhaler Inhale 2 puffs every 6 hours for asthma - rescue inhaler (Patient taking differently: Inhale 2 puffs into the lungs every 6 (six) hours. ) 1 Inhaler 12  . albuterol (PROVENTIL) (2.5 MG/3ML) 0.083% nebulizer solution Take 2.5 mg by nebulization every 6 (six) hours as needed for wheezing or shortness of breath.    Marland Kitchen aspirin 81 MG chewable tablet Chew 81 mg by mouth daily. Not taking until after Monday 07/12/17 due to surgery on her trigger finger    . baclofen (LIORESAL) 10 MG tablet Take 10 mg by mouth at bedtime.    . diphenhydrAMINE (BENADRYL) 25 mg capsule Take 25 mg by mouth 2 (two) times daily as needed for itching or allergies.    Marland Kitchen divalproex (DEPAKOTE) 250 MG DR tablet Take 250 mg by mouth daily. In the morning with 500 mg dose    . divalproex (DEPAKOTE) 500 MG DR tablet Take 500 mg by mouth 3 (three) times daily.     Marland Kitchen docusate sodium (COLACE) 100 MG capsule Take 1 capsule (100 mg total) by mouth daily. 30 capsule 0  . fesoterodine (TOVIAZ) 8 MG TB24 tablet Toviaz 8 mg tablet,extended release    . fluticasone furoate-vilanterol (BREO ELLIPTA) 100-25 MCG/INH AEPB Inhale 1 puff into the lungs daily. 1 each 11  . gabapentin (NEURONTIN) 300 MG capsule Take 300-600 mg by mouth See admin instructions. Take 300 mg in the morning and 600 mg at bedtime    . GENERLAC 10 GM/15ML SOLN     . Glucose-Vitamin C-Vitamin D (TRUEPLUS GLUCOSE)  CHEW Chew by mouth as needed.    Marland Kitchen guaifenesin (ROBITUSSIN) 100 MG/5ML syrup Take 200 mg by mouth every 6 (six) hours as needed for cough.    Marland Kitchen HYDROcodone-acetaminophen (NORCO/VICODIN) 5-325 MG tablet Take 1 tablet by mouth 3 (three) times daily.    . hydrocortisone (PROCTO-PAK) 1 % CREA Apply 1 application topically 2 (two) times daily as needed (hemorrhoid flare). For up to 10 days at a time 1 Tube 11  . ibuprofen (ADVIL,MOTRIN) 200 MG tablet Take 200 mg by mouth every 6 (six) hours as needed.    . insulin glargine (LANTUS) 100 UNIT/ML injection Inject 25 Units into the skin at bedtime.     Marland Kitchen ipratropium (ATROVENT) 0.03 % nasal spray 1-2 puffs each nostril 3 times daily,  before meals (Patient taking differently: Place 1-2 sprays into both nostrils 3 (three) times daily. ) 30 mL 11  . lactulose (CHRONULAC) 10 GM/15ML solution Take 10 g by mouth daily.    Marland Kitchen lamoTRIgine (LAMICTAL) 100 MG tablet lamotrigine 100 mg tablet    . levothyroxine (SYNTHROID, LEVOTHROID) 75 MCG tablet TAKE (1) TABLET BY MOUTH ONCE DAILY BEFORE BREAKFAST. 30 tablet 5  . loperamide (IMODIUM A-D) 2 MG tablet Take 2 mg by mouth 4 (four) times daily as needed for diarrhea or loose stools.     . lubiprostone (AMITIZA) 24 MCG capsule Take 1 capsule (24 mcg total) by mouth 2 (two) times daily with a meal. 60 capsule 3  . magnesium hydroxide (MILK OF MAGNESIA) 400 MG/5ML suspension Take 30 mLs by mouth at bedtime as needed for mild constipation.     . Menthol, Topical Analgesic, (BIOFREEZE) 4 % GEL Apply 1 application topically 2 (two) times daily. Apply to lower back and base of right thumb twice daily.    . metFORMIN (GLUCOPHAGE) 1000 MG tablet Take by mouth.    . metoprolol succinate (TOPROL-XL) 50 MG 24 hr tablet Take 50 mg by mouth daily. Take with or immediately following a meal.    . metoprolol tartrate (LOPRESSOR) 50 MG tablet metoprolol tartrate 50 mg tablet    . mirabegron ER (MYRBETRIQ) 25 MG TB24 tablet Take 25 mg by  mouth daily.    . Multiple Vitamins-Minerals (MULTIVITAMINS THER. W/MINERALS) TABS tablet TAKE ONE TABLET BY MOUTH ONCE DAILY. 30 each 11  . naloxegol oxalate (MOVANTIK) 25 MG TABS tablet Take 25 mg by mouth daily.    Marland Kitchen NOVOLOG FLEXPEN 100 UNIT/ML FlexPen Inject 8 Units into the skin 3 (three) times daily before meals.     Marland Kitchen olmesartan-hydrochlorothiazide (BENICAR HCT) 40-25 MG tablet Take by mouth.    . Omega 3 1000 MG CAPS TAKE (1) CAPSULE BY MOUTH ONCE DAILY. 30 capsule 11  . pantoprazole (PROTONIX) 40 MG tablet Take 40 mg by mouth daily.    . polyethylene glycol (MIRALAX / GLYCOLAX) packet Take 17 g by mouth daily as needed for moderate constipation. 14 each 0  . potassium chloride SA (K-DUR,KLOR-CON) 20 MEQ tablet Take 20 mEq by mouth daily.    . primidone (MYSOLINE) 50 MG tablet Take 2 tablets (100 mg total) by mouth 2 (two) times daily. (Patient taking differently: Take by mouth. 2 in the morning 1 at night) 360 tablet 1  . promethazine-codeine (PHENERGAN WITH CODEINE) 6.25-10 MG/5ML syrup Take 5 mLs by mouth every 6 (six) hours as needed for cough. 118 mL 0  . QUEtiapine (SEROQUEL) 100 MG tablet Take 100 mg by mouth at bedtime.    Marland Kitchen Respiratory Therapy Supplies (FLUTTER) DEVI Use flutter device 3 times a day 1 each 0  . simvastatin (ZOCOR) 5 MG tablet Take 1 tablet by mouth daily.    . sitaGLIPtin (JANUVIA) 100 MG tablet Take 100 mg by mouth daily.    . sodium chloride (OCEAN) 0.65 % SOLN nasal spray Take 1-2 sprays in nostril every hour as needed for rhinitis 30 mL PRN  . sodium chloride 1 g tablet Take 1 g by mouth 2 (two) times daily.    . sucralfate (CARAFATE) 1 g tablet Take 1 g by mouth 2 (two) times daily.     Marland Kitchen triamterene-hydrochlorothiazide (MAXZIDE-25) 37.5-25 MG tablet Take 1 tablet by mouth daily.    . mometasone-formoterol (DULERA) 200-5 MCG/ACT AERO 2 puffs and 1 puff at night    .  predniSONE (DELTASONE) 10 MG tablet Take 2 tabs x 5 days 10 tablet 0   No  facility-administered medications prior to visit.     Review of Systems  Review of Systems  Constitutional: Negative.   HENT: Positive for postnasal drip.   Respiratory: Positive for wheezing. Negative for cough and shortness of breath.   Skin: Negative.     Physical Exam  BP 114/74 (BP Location: Right Arm, Cuff Size: Normal)   Pulse 80   Temp 97.9 F (36.6 C)   Ht 5\' 5"  (1.651 m)   Wt 183 lb 6.4 oz (83.2 kg)   SpO2 98%   BMI 30.52 kg/m  Physical Exam  Constitutional: She is oriented to person, place, and time. She appears well-developed and well-nourished. No distress.  HENT:  Head: Normocephalic and atraumatic.  Eyes: Pupils are equal, round, and reactive to light. EOM are normal.  Neck: Normal range of motion. Neck supple.  Cardiovascular: Normal rate and regular rhythm.  Pulmonary/Chest:  Mostly clear, upper lobe exp wheeze. NO resp distress or accessory muscle use  Musculoskeletal:  In [facility]  Neurological: She is alert and oriented to person, place, and time.  Skin: Skin is warm and dry.  Psychiatric: She has a normal mood and affect. Her behavior is normal. Judgment and thought content normal.     Lab Results:  CBC    Component Value Date/Time   WBC 6.1 09/30/2017 2325   RBC 3.85 (L) 09/30/2017 2325   HGB 12.5 09/30/2017 2325   HCT 35.6 (L) 09/30/2017 2325   PLT 167 09/30/2017 2325   MCV 92.5 09/30/2017 2325   MCH 32.5 09/30/2017 2325   MCHC 35.1 09/30/2017 2325   RDW 12.3 09/30/2017 2325   LYMPHSABS 2.7 09/30/2017 2325   MONOABS 0.6 09/30/2017 2325   EOSABS 0.2 09/30/2017 2325   BASOSABS 0.0 09/30/2017 2325    BMET    Component Value Date/Time   NA 126 (L) 09/30/2017 2325   K 3.8 09/30/2017 2325   CL 91 (L) 09/30/2017 2325   CO2 27 09/30/2017 2325   GLUCOSE 150 (H) 09/30/2017 2325   BUN 10 09/30/2017 2325   CREATININE 0.52 09/30/2017 2325   CREATININE 0.70 12/27/2012 1006   CALCIUM 8.7 (L) 09/30/2017 2325   GFRNONAA >60 09/30/2017 2325    GFRAA >60 09/30/2017 2325    BNP No results found for: BNP  ProBNP    Component Value Date/Time   PROBNP 411.3 (H) 12/04/2012 1659    Imaging: Dg Chest 2 View  Result Date: 01/11/2018 CLINICAL DATA:  Cough, chest congestion, shortness of breath, and wheezing for the past 2 days. History of asthma, COPD, former smoker, previous episodes of pneumonia. EXAM: CHEST - 2 VIEW COMPARISON:  Chest x-ray of November 17, 2017 FINDINGS: The lungs are well-expanded and clear. The heart and pulmonary vascularity are normal. The mediastinum is normal in width. There is calcification in the wall of the aortic arch. Nerve stimulator generator is a are present in the pectoral regions bilaterally. The bony structures are unremarkable. IMPRESSION: There is no active cardiopulmonary disease. Thoracic aortic atherosclerosis. Electronically Signed   By: David  [place]ique M.D.   On: 01/11/2018 10:33     Assessment & Plan:   Moderate persistent asthma with acute bronchitis and acute exacerbation - Improved, completed prednisone course - CXR with no acute findings   Asthma, moderate persistent, poorly-controlled Continue BREO once daily in morning  Use Flutter valve prn chest congestion Albuterol nebulizer every 6 hours  as needed for sob/wheezing   Regular follow-up scheduled for Jan 2020 with Dr. Kaleen Mask, NP 01/18/2018

## 2018-01-20 ENCOUNTER — Ambulatory Visit: Payer: Self-pay | Admitting: Internal Medicine

## 2018-01-21 ENCOUNTER — Telehealth: Payer: Self-pay | Admitting: Internal Medicine

## 2018-01-21 NOTE — Telephone Encounter (Signed)
Spoke with [place] and notified her of recommendations per Dr Annamaria Boots  She verbalized understanding and asked that we fax her documentation of this  I asked if okay to sent this telephone note and she stated that was fine  Faxed to [phone]  Nothing further needed

## 2018-01-21 NOTE — Telephone Encounter (Signed)
Recommend cancel the prometh codeine cough syrup. Substitute Delsym cough syrup, 5 -10 ml every 12 hours if needed

## 2018-01-21 NOTE — Telephone Encounter (Signed)
Spoke to [place] with [facility].  Katrina states pt was prescribed Promethazine at 01/18/18 OV, however pt was also recently prescribed hydrocodone.  Katrina states per the pharmacist, pt is taking too much codeine.  Katrina is requesting recommendations.   CY please advise, as Eust[place]io Maize is unavailable. Thanks  Current Outpatient Medications on File Prior to Visit  Medication Sig Dispense Refill  . acetaminophen (TYLENOL) 325 MG tablet Take 650 mg by mouth 3 (three) times daily.    Marland Kitchen albuterol (PROAIR HFA) 108 (90 Base) MCG/ACT inhaler Inhale 2 puffs every 6 hours for asthma - rescue inhaler (Patient taking differently: Inhale 2 puffs into the lungs every 6 (six) hours. ) 1 Inhaler 12  . albuterol (PROVENTIL) (2.5 MG/3ML) 0.083% nebulizer solution Take 2.5 mg by nebulization every 6 (six) hours as needed for wheezing or shortness of breath.    Marland Kitchen aspirin 81 MG chewable tablet Chew 81 mg by mouth daily. Not taking until after Monday 07/12/17 due to surgery on her trigger finger    . baclofen (LIORESAL) 10 MG tablet Take 10 mg by mouth at bedtime.    . diphenhydrAMINE (BENADRYL) 25 mg capsule Take 25 mg by mouth 2 (two) times daily as needed for itching or allergies.    Marland Kitchen divalproex (DEPAKOTE) 250 MG DR tablet Take 250 mg by mouth daily. In the morning with 500 mg dose    . divalproex (DEPAKOTE) 500 MG DR tablet Take 500 mg by mouth 3 (three) times daily.     Marland Kitchen docusate sodium (COLACE) 100 MG capsule Take 1 capsule (100 mg total) by mouth daily. 30 capsule 0  . fesoterodine (TOVIAZ) 8 MG TB24 tablet Toviaz 8 mg tablet,extended release    . fluticasone furoate-vilanterol (BREO ELLIPTA) 100-25 MCG/INH AEPB Inhale 1 puff into the lungs daily. 1 each 11  . gabapentin (NEURONTIN) 300 MG capsule Take 300-600 mg by mouth See admin instructions. Take 300 mg in the morning and 600 mg at bedtime    . GENERLAC 10 GM/15ML SOLN     . Glucose-Vitamin C-Vitamin D (TRUEPLUS GLUCOSE) CHEW Chew by mouth as needed.     Marland Kitchen guaifenesin (ROBITUSSIN) 100 MG/5ML syrup Take 200 mg by mouth every 6 (six) hours as needed for cough.    Marland Kitchen HYDROcodone-acetaminophen (NORCO/VICODIN) 5-325 MG tablet Take 1 tablet by mouth 3 (three) times daily.    . hydrocortisone (PROCTO-PAK) 1 % CREA Apply 1 application topically 2 (two) times daily as needed (hemorrhoid flare). For up to 10 days at a time 1 Tube 11  . ibuprofen (ADVIL,MOTRIN) 200 MG tablet Take 200 mg by mouth every 6 (six) hours as needed.    . insulin glargine (LANTUS) 100 UNIT/ML injection Inject 25 Units into the skin at bedtime.     Marland Kitchen ipratropium (ATROVENT) 0.03 % nasal spray 1-2 puffs each nostril 3 times daily, before meals (Patient taking differently: Place 1-2 sprays into both nostrils 3 (three) times daily. ) 30 mL 11  . lactulose (CHRONULAC) 10 GM/15ML solution Take 10 g by mouth daily.    Marland Kitchen lamoTRIgine (LAMICTAL) 100 MG tablet lamotrigine 100 mg tablet    . levothyroxine (SYNTHROID, LEVOTHROID) 75 MCG tablet TAKE (1) TABLET BY MOUTH ONCE DAILY BEFORE BREAKFAST. 30 tablet 5  . loperamide (IMODIUM A-D) 2 MG tablet Take 2 mg by mouth 4 (four) times daily as needed for diarrhea or loose stools.     . lubiprostone (AMITIZA) 24 MCG capsule Take 1 capsule (24 mcg total) by mouth 2 (  two) times daily with a meal. 60 capsule 3  . magnesium hydroxide (MILK OF MAGNESIA) 400 MG/5ML suspension Take 30 mLs by mouth at bedtime as needed for mild constipation.     . Menthol, Topical Analgesic, (BIOFREEZE) 4 % GEL Apply 1 application topically 2 (two) times daily. Apply to lower back and base of right thumb twice daily.    . metFORMIN (GLUCOPHAGE) 1000 MG tablet Take by mouth.    . metoprolol succinate (TOPROL-XL) 50 MG 24 hr tablet Take 50 mg by mouth daily. Take with or immediately following a meal.    . metoprolol tartrate (LOPRESSOR) 50 MG tablet metoprolol tartrate 50 mg tablet    . mirabegron ER (MYRBETRIQ) 25 MG TB24 tablet Take 25 mg by mouth daily.    . Multiple  Vitamins-Minerals (MULTIVITAMINS THER. W/MINERALS) TABS tablet TAKE ONE TABLET BY MOUTH ONCE DAILY. 30 each 11  . naloxegol oxalate (MOVANTIK) 25 MG TABS tablet Take 25 mg by mouth daily.    Marland Kitchen NOVOLOG FLEXPEN 100 UNIT/ML FlexPen Inject 8 Units into the skin 3 (three) times daily before meals.     Marland Kitchen olmesartan-hydrochlorothiazide (BENICAR HCT) 40-25 MG tablet Take by mouth.    . Omega 3 1000 MG CAPS TAKE (1) CAPSULE BY MOUTH ONCE DAILY. 30 capsule 11  . pantoprazole (PROTONIX) 40 MG tablet Take 40 mg by mouth daily.    . polyethylene glycol (MIRALAX / GLYCOLAX) packet Take 17 g by mouth daily as needed for moderate constipation. 14 each 0  . potassium chloride SA (K-DUR,KLOR-CON) 20 MEQ tablet Take 20 mEq by mouth daily.    . primidone (MYSOLINE) 50 MG tablet Take 2 tablets (100 mg total) by mouth 2 (two) times daily. (Patient taking differently: Take by mouth. 2 in the morning 1 at night) 360 tablet 1  . promethazine-codeine (PHENERGAN WITH CODEINE) 6.25-10 MG/5ML syrup Take 5 mLs by mouth every 6 (six) hours as needed for cough. 118 mL 0  . QUEtiapine (SEROQUEL) 100 MG tablet Take 100 mg by mouth at bedtime.    Marland Kitchen Respiratory Therapy Supplies (FLUTTER) DEVI Use flutter device 3 times a day 1 each 0  . simvastatin (ZOCOR) 5 MG tablet Take 1 tablet by mouth daily.    . sitaGLIPtin (JANUVIA) 100 MG tablet Take 100 mg by mouth daily.    . sodium chloride (OCEAN) 0.65 % SOLN nasal spray Take 1-2 sprays in nostril every hour as needed for rhinitis 30 mL PRN  . sodium chloride 1 g tablet Take 1 g by mouth 2 (two) times daily.    . sucralfate (CARAFATE) 1 g tablet Take 1 g by mouth 2 (two) times daily.     Marland Kitchen triamterene-hydrochlorothiazide (MAXZIDE-25) 37.5-25 MG tablet Take 1 tablet by mouth daily.     No current facility-administered medications on file prior to visit.     Allergies  Allergen Reactions  . Invokana [Canagliflozin] Other (See Comments)    Yeast infectios  . Losartan Other (See  Comments)    Angioedema   . Metformin And Related Diarrhea  . Latex Other (See Comments)    Other Reaction: redness, blisters  . Other Other (See Comments)    Air fresheners - on MAR  . Oxycodone-Acetaminophen Itching and Other (See Comments)    TYLOX. Caused internal itching per patient   . Mucinex [Guaifenesin Er] Other (See Comments)    MUCINEX REACTION: asthma attacks

## 2018-02-09 ENCOUNTER — Telehealth: Payer: Self-pay | Admitting: Neurology

## 2018-02-09 ENCOUNTER — Telehealth: Payer: Self-pay | Admitting: Internal Medicine

## 2018-02-09 NOTE — Telephone Encounter (Signed)
Katina from [facility] is calling on this patient in regards to wanting to check to make sure prescription is correct. She has to take the Primidone 50mg  once in AM and once in PM then to decrease to only once in the AM. Please call her to let her know if this correct at [phone] or fax to [phone]. Thanks!

## 2018-02-09 NOTE — Telephone Encounter (Signed)
Tried to call but [place] unavailable. I was passed to several people before I decided to fax information to them directly. Note faxed to number provided letting them know she should be on Primidone once daily.

## 2018-02-09 NOTE — Telephone Encounter (Addendum)
I have checked CY's look at and folder up front, it does not appear that Rx request has been received.  Called and spoke to [place] with [facility], and requested that she refax request to our office.  Will leave message in triage, until fax is received.

## 2018-02-10 NOTE — Telephone Encounter (Signed)
Noted by triage. Will close encounter, as nothing further is needed.  

## 2018-02-10 NOTE — Telephone Encounter (Signed)
Form received and placed with CY for clarification and signature. Once signed we will fax back and notify [facility].

## 2018-02-10 NOTE — Telephone Encounter (Signed)
KW have you seen this form signed from [place] yet. Please advise.

## 2018-02-10 NOTE — Telephone Encounter (Signed)
Form has been completed and faxed back as requested. Forms are in CY's scan folder. Thanks.

## 2018-02-14 ENCOUNTER — Encounter (HOSPITAL_BASED_OUTPATIENT_CLINIC_OR_DEPARTMENT_OTHER): Payer: Medicare Other | Attending: Internal Medicine

## 2018-02-14 DIAGNOSIS — E11622 Type 2 diabetes mellitus with other skin ulcer: Secondary | ICD-10-CM | POA: Diagnosis not present

## 2018-02-14 DIAGNOSIS — Z9682 Presence of neurostimulator: Secondary | ICD-10-CM | POA: Insufficient documentation

## 2018-02-14 DIAGNOSIS — J449 Chronic obstructive pulmonary disease, unspecified: Secondary | ICD-10-CM | POA: Diagnosis not present

## 2018-02-14 DIAGNOSIS — G250 Essential tremor: Secondary | ICD-10-CM | POA: Diagnosis not present

## 2018-02-14 DIAGNOSIS — I10 Essential (primary) hypertension: Secondary | ICD-10-CM | POA: Diagnosis not present

## 2018-02-14 DIAGNOSIS — L97311 Non-pressure chronic ulcer of right ankle limited to breakdown of skin: Secondary | ICD-10-CM | POA: Diagnosis not present

## 2018-02-14 DIAGNOSIS — Z794 Long term (current) use of insulin: Secondary | ICD-10-CM | POA: Insufficient documentation

## 2018-02-14 DIAGNOSIS — G4730 Sleep apnea, unspecified: Secondary | ICD-10-CM | POA: Insufficient documentation

## 2018-02-14 DIAGNOSIS — Z87891 Personal history of nicotine dependence: Secondary | ICD-10-CM | POA: Diagnosis not present

## 2018-03-02 DIAGNOSIS — E11622 Type 2 diabetes mellitus with other skin ulcer: Secondary | ICD-10-CM | POA: Diagnosis not present

## 2018-03-10 ENCOUNTER — Encounter (HOSPITAL_BASED_OUTPATIENT_CLINIC_OR_DEPARTMENT_OTHER): Payer: Medicare Other | Attending: Internal Medicine

## 2018-03-10 DIAGNOSIS — L97312 Non-pressure chronic ulcer of right ankle with fat layer exposed: Secondary | ICD-10-CM | POA: Insufficient documentation

## 2018-03-10 DIAGNOSIS — J449 Chronic obstructive pulmonary disease, unspecified: Secondary | ICD-10-CM | POA: Insufficient documentation

## 2018-03-10 DIAGNOSIS — F17200 Nicotine dependence, unspecified, uncomplicated: Secondary | ICD-10-CM | POA: Insufficient documentation

## 2018-03-10 DIAGNOSIS — Z794 Long term (current) use of insulin: Secondary | ICD-10-CM | POA: Insufficient documentation

## 2018-03-10 DIAGNOSIS — I10 Essential (primary) hypertension: Secondary | ICD-10-CM | POA: Insufficient documentation

## 2018-03-10 DIAGNOSIS — E11622 Type 2 diabetes mellitus with other skin ulcer: Secondary | ICD-10-CM | POA: Diagnosis present

## 2018-03-10 DIAGNOSIS — G4730 Sleep apnea, unspecified: Secondary | ICD-10-CM | POA: Diagnosis not present

## 2018-03-17 ENCOUNTER — Encounter: Payer: Self-pay | Admitting: Internal Medicine

## 2018-03-17 ENCOUNTER — Ambulatory Visit (INDEPENDENT_AMBULATORY_CARE_PROVIDER_SITE_OTHER): Payer: Medicare Other | Admitting: Internal Medicine

## 2018-03-17 DIAGNOSIS — R05 Cough: Secondary | ICD-10-CM | POA: Diagnosis not present

## 2018-03-17 DIAGNOSIS — J4540 Moderate persistent asthma, uncomplicated: Secondary | ICD-10-CM

## 2018-03-17 DIAGNOSIS — R058 Other specified cough: Secondary | ICD-10-CM

## 2018-03-17 DIAGNOSIS — E11622 Type 2 diabetes mellitus with other skin ulcer: Secondary | ICD-10-CM | POA: Diagnosis not present

## 2018-03-17 MED ORDER — DOXYCYCLINE HYCLATE 100 MG PO TABS: ORAL_TABLET | ORAL | 0 refills | Status: DC

## 2018-03-17 NOTE — Progress Notes (Signed)
Patient ID: [name], female    DOB: 07-12-1950, 67 y.o.   MRN: [id number]  HPI  female former smoker followed for Allergic rhinitis, Asthma, complicated by anxiety/VCD, CAD, GERD, DM2, tachycardia, tremor/deep brain stimulator, HBP, now living in assisted living Allergy Profile 01/10/2013-total IgE 166 with significant elevations for most common allergens except molds She had been on allergy vaccine years ago. Office Spirometry 12/13/2014-mild obstruction, mild restriction of forced vital capacity Upper endoscopy 2016-[name]'s ring and gastric erosions ---------------------------------------------------------- 10/15/2017- 67 year old female former smoker followed for Allergic rhinitis, Asthma, complicated by anxiety/VCD, CAD, GERD, DM, tachycardia, tremor/deep brain stimulator, HBP, now living in assisted living/ [place] -----Asthma: Pt is having flare ups of asthma and productive cough-yellow in color for about 2 weeks.  Breo 100, neb albuterol, Proair hfa Reports increased cough with occasional wheeze over the last 2 months.  Sputum occasionally milky or yellow.  No fever or blood.  Much sinus drainage while using Atrovent 0.03% nasal spray 3 times daily.  Benadryl twice daily.  03/17/2018- 67 year old female former smoker followed for Allergic rhinitis, Asthma, complicated by anxiety/VCD, CAD, GERD, DM, tachycardia, tremor/deep brain stimulator, HBP, now living in assisted living/ [facility] Asthmatic bronchitis exacerbation treated by NP here in October. -----Cough-yellow in color x 1 week and SOB with wheezing.  Atrovent nasal spray, Breo 100, neb albuterol, ProAir hfa Says she feels a little "rough" over the last for 5 days with increased cough, some yellow/green mucous plugs.  Denies fever, sore throat, shortness of breath. CXR 01/11/2018-  IMPRESSION: There is no active cardiopulmonary disease. Thoracic aortic atherosclerosis.  ROS-see HPI    "+" =  positive Constitutional:    weight loss, night sweats, fevers, chills, fatigue, lassitude. HEENT:    headaches, difficulty swallowing, tooth/dental problems, sore throat,       sneezing, itching, ear ache, nasal congestion, +post nasal drip, snoring CV:    chest pain, orthopnea, PND, swelling in lower extremities, anasarca,                                                 dizziness, palpitations Resp:   +shortness of breath with exertion or at rest.                +productive cough,    non-productive cough, coughing up of blood.              +change in color of mucus.  +wheezing.   Skin:    rash or lesions. GI:  + heartburn, indigestion, abdominal pain, nausea, vomiting, GU:  MS:   joint pain, stiffness, decreased range of motion, back pain. Neuro-    + chronic tremor, +unsteady/ walker Psych:  +change in mood or affect.  +depression or anxiety.   memory loss.  OBJ- Physical Exam General- Alert, Oriented, Affect-appropriate, Distress- none acute, talkative,+obese,           +wheelchair Skin- rash-none, lesions- none, excoriation- none Lymphadenopathy- none Head- atraumatic            Eyes- Gross vision intact, PERRLA, conjunctivae and secretions clear            Ears- Hearing, canals-normal            Nose- Clear, no-Septal dev, mucus, polyps, erosion, perforation             Throat- Mallampati II ,  mucosa -tongue slightly coated , drainage- none, tonsils-                  atrophic, + missing teeth    Neck- flexible , trachea midline, no stridor , thyroid nl, carotid no bruit Chest - symmetrical excursion , unlabored           Heart/CV- RRR , no murmur , no gallop  , no rub, nl s1 s2                           - JVD- none , edema- none, stasis changes- none, varices- none           Lung-  unlabored, wheeze + bilateral., cough-none , dullness-none, rub- none, rhonchi-none           Chest wall-+ pacemaker  (deep brain stimulator) left Abd-  Br/ Gen/ Rectal- Not done, not indicated Extrem-  cyanosis- none, clubbing, none, atrophy- none, strength- nl,  Neuro- + tremor and head bob,+ mild dysphonia, + unsteady

## 2018-03-17 NOTE — Patient Instructions (Signed)
Script sent for doxycycline antibiotic  Order- neb xop 0.63  Ok to continue present meds  Please call if we can help

## 2018-03-18 ENCOUNTER — Ambulatory Visit (INDEPENDENT_AMBULATORY_CARE_PROVIDER_SITE_OTHER): Payer: Medicare Other | Admitting: Podiatry

## 2018-03-18 DIAGNOSIS — B351 Tinea unguium: Secondary | ICD-10-CM

## 2018-03-18 DIAGNOSIS — M79674 Pain in right toe(s): Secondary | ICD-10-CM

## 2018-03-18 DIAGNOSIS — M79675 Pain in left toe(s): Secondary | ICD-10-CM

## 2018-03-18 DIAGNOSIS — E1142 Type 2 diabetes mellitus with diabetic polyneuropathy: Secondary | ICD-10-CM

## 2018-03-18 NOTE — Assessment & Plan Note (Signed)
She is wheezing now, consistent with her impression that she is had an exacerbation in the last 3 to 4 days.  I cannot tell that she has an overt infection, without a better if we cover her through the rainy weather and upcoming holidays by treating with doxycycline now.  She is up-to-date with flu vaccine. Plan-doxycycline, nebulizer treatment with Xopenex 0.63 today

## 2018-03-18 NOTE — Assessment & Plan Note (Signed)
She gets episodes of cough occasionally which may include a reflux component.  She gets anxious with these, but has been doing better through the summer. Plan-emphasis on reflux precautions.

## 2018-03-20 NOTE — Progress Notes (Signed)
Subjective:   Patient ID: [name], female   DOB: 67 y.o.   MRN: [id number]   HPI Patient presents with painful nailbeds 1-5 both feet that she cannot cut and they are thick and incurvated   ROS      Objective:  Physical Exam  Neurovascular status intact with thick yellow brittle nailbeds 1-5 both feet that are painful     Assessment:  Chronic mycotic nail infection with pain 1-5 both feet     Plan:  Debride painful nailbeds 1-5 both feet with no iatrogenic bleeding noted

## 2018-03-24 DIAGNOSIS — E11622 Type 2 diabetes mellitus with other skin ulcer: Secondary | ICD-10-CM | POA: Diagnosis not present

## 2018-04-01 DIAGNOSIS — E11622 Type 2 diabetes mellitus with other skin ulcer: Secondary | ICD-10-CM | POA: Diagnosis not present

## 2018-04-08 ENCOUNTER — Encounter (HOSPITAL_BASED_OUTPATIENT_CLINIC_OR_DEPARTMENT_OTHER): Payer: Medicare Other | Attending: Internal Medicine

## 2018-04-08 DIAGNOSIS — J449 Chronic obstructive pulmonary disease, unspecified: Secondary | ICD-10-CM | POA: Diagnosis not present

## 2018-04-08 DIAGNOSIS — G4730 Sleep apnea, unspecified: Secondary | ICD-10-CM | POA: Insufficient documentation

## 2018-04-08 DIAGNOSIS — Z794 Long term (current) use of insulin: Secondary | ICD-10-CM | POA: Diagnosis not present

## 2018-04-08 DIAGNOSIS — E11622 Type 2 diabetes mellitus with other skin ulcer: Secondary | ICD-10-CM | POA: Diagnosis present

## 2018-04-08 DIAGNOSIS — I10 Essential (primary) hypertension: Secondary | ICD-10-CM | POA: Diagnosis not present

## 2018-04-08 DIAGNOSIS — L97312 Non-pressure chronic ulcer of right ankle with fat layer exposed: Secondary | ICD-10-CM | POA: Diagnosis not present

## 2018-04-11 ENCOUNTER — Ambulatory Visit: Payer: Self-pay | Admitting: Primary Care

## 2018-04-15 DIAGNOSIS — E11622 Type 2 diabetes mellitus with other skin ulcer: Secondary | ICD-10-CM | POA: Diagnosis not present

## 2018-04-18 ENCOUNTER — Ambulatory Visit: Payer: Self-pay | Admitting: Internal Medicine

## 2018-04-20 ENCOUNTER — Ambulatory Visit (INDEPENDENT_AMBULATORY_CARE_PROVIDER_SITE_OTHER): Payer: Medicare Other | Admitting: Internal Medicine

## 2018-04-20 ENCOUNTER — Ambulatory Visit (INDEPENDENT_AMBULATORY_CARE_PROVIDER_SITE_OTHER)
Admission: RE | Admit: 2018-04-20 | Discharge: 2018-04-20 | Disposition: A | Discharge status: Home / Self Care | Payer: Medicare Other | Source: Ambulatory Visit | Attending: Internal Medicine | Admitting: Internal Medicine

## 2018-04-20 ENCOUNTER — Ambulatory Visit: Payer: Self-pay | Admitting: Internal Medicine

## 2018-04-20 ENCOUNTER — Encounter: Payer: Self-pay | Admitting: Internal Medicine

## 2018-04-20 VITALS — BP 122/64 | HR 81 | Ht 64.5 in | Wt 173.0 lb

## 2018-04-20 DIAGNOSIS — J42 Unspecified chronic bronchitis: Secondary | ICD-10-CM | POA: Diagnosis not present

## 2018-04-20 DIAGNOSIS — K222 Esophageal obstruction: Secondary | ICD-10-CM | POA: Diagnosis not present

## 2018-04-20 DIAGNOSIS — J4540 Moderate persistent asthma, uncomplicated: Secondary | ICD-10-CM | POA: Diagnosis not present

## 2018-04-20 LAB — CBC WITH DIFFERENTIAL/PLATELET
Basophils Absolute: 0 10*3/uL (ref 0.0–0.1)
Basophils Relative: 0.2 % (ref 0.0–3.0)
Eosinophils Absolute: 0.2 10*3/uL (ref 0.0–0.7)
Eosinophils Relative: 4.1 % (ref 0.0–5.0)
HCT: 36.9 % (ref 36.0–46.0)
Hemoglobin: 12.8 g/dL (ref 12.0–15.0)
Lymphocytes Relative: 41.2 % (ref 12.0–46.0)
Lymphs Abs: 2.2 10*3/uL (ref 0.7–4.0)
MCHC: 34.6 g/dL (ref 30.0–36.0)
MCV: 95.3 fl (ref 78.0–100.0)
Monocytes Absolute: 0.4 10*3/uL (ref 0.1–1.0)
Monocytes Relative: 7 % (ref 3.0–12.0)
Neutro Abs: 2.5 10*3/uL (ref 1.4–7.7)
Neutrophils Relative %: 47.5 % (ref 43.0–77.0)
Platelets: 103 10*3/uL — ABNORMAL LOW (ref 150.0–400.0)
RBC: 3.88 Mil/uL (ref 3.87–5.11)
RDW: 13.5 % (ref 11.5–15.5)
WBC: 5.3 10*3/uL (ref 4.0–10.5)

## 2018-04-20 NOTE — Assessment & Plan Note (Signed)
There is some variation from day-to-day but I think she is within her baseline normal range and cannot tell that she has an active infection now.  She is not tender over the right lower rib cage or right upper abdomen, has no rub or obvious consolidation. Plan-continue current meds, defer antibiotic, order CXR and CBC with differential

## 2018-04-20 NOTE — Addendum Note (Signed)
Addended by: Suzzanne Cloud E on: 04/20/2018 11:15 AM   Modules accepted: Orders

## 2018-04-20 NOTE — Patient Instructions (Signed)
Ok to continue current meds  Order- CXR    Dx chronic bronchitis               Lab- CBC w diff  I don't think you need an antibiotic today. If you get worse, please call.

## 2018-04-20 NOTE — Progress Notes (Signed)
Patient ID: [name], female    DOB: 1950-10-14, 68 y.o.   MRN: [id number]  HPI  female former smoker followed for Allergic rhinitis, Asthma, complicated by anxiety/VCD, CAD, GERD, DM2, tachycardia, tremor/deep brain stimulator, HBP, now living in assisted living Allergy Profile 01/10/2013-total IgE 166 with significant elevations for most common allergens except molds She had been on allergy vaccine years ago. Office Spirometry 12/13/2014-mild obstruction, mild restriction of forced vital capacity Upper endoscopy 2016-[name]'s ring and gastric erosions ----------------------------------------------------------  03/17/2018- 68 year old female former smoker followed for Allergic rhinitis, Asthma, complicated by anxiety/VCD, CAD, GERD, DM, tachycardia, tremor/deep brain stimulator, HBP, now living in assisted living/ [facility] Asthmatic bronchitis exacerbation treated by NP here in October. -----Cough-yellow in color x 1 week and SOB with wheezing.  Atrovent nasal spray, Breo 100, neb albuterol, ProAir hfa Says she feels a little "rough" over the last for 5 days with increased cough, some yellow/green mucous plugs.  Denies fever, sore throat, shortness of breath. CXR 01/11/2018-  IMPRESSION: There is no active cardiopulmonary disease. Thoracic aortic atherosclerosis.  04/20/2018- 68 year old female former smoker followed for Allergic rhinitis, Asthma, complicated by anxiety/VCD, CAD, GERD, DM, tachycardia, tremor/deep brain stimulator, HBP, now living in assisted living/ [facility] Recent upper endoscopy-normal with dilatation done -----Asthma: Wheezing and congestion with cough-yellow in color.  Pro-air HFA, neb albuterol, Breo 100, Atrovent 0.03% nasal spray, Variable cough-not new.  She does not think she has an infection.  Has noted some tussive soreness right lower lateral rib area intermittently over the past month.  Soreness is associated with cough and does not bother  her during sleep.  No fever, sore throat, blood or GI changes. Treated at wound center for pressure sore right lateral malleolus.  ROS-see HPI    "+" = positive Constitutional:    weight loss, night sweats, fevers, chills, fatigue, lassitude. HEENT:    headaches, difficulty swallowing, tooth/dental problems, sore throat,       sneezing, itching, ear ache, nasal congestion, +post nasal drip, snoring CV:    chest pain, orthopnea, PND, swelling in lower extremities, anasarca,                                                 dizziness, palpitations Resp:   +shortness of breath with exertion or at rest.                +productive cough,    non-productive cough, coughing up of blood.              +change in color of mucus.  +wheezing.   Skin:    rash or lesions. GI:  + heartburn, indigestion, abdominal pain, nausea, vomiting, GU:  MS:   joint pain, stiffness, decreased range of motion, back pain. Neuro-    + chronic tremor, +unsteady/ walker Psych:  +change in mood or affect.  +depression or anxiety.   memory loss.  OBJ- Physical Exam General- Alert, Oriented, Affect-appropriate, Distress- none acute, talkative,+obese,           +wheelchair Skin- rash-none, lesions- none, excoriation- none Lymphadenopathy- none Head- atraumatic            Eyes- Gross vision intact, PERRLA, conjunctivae and secretions clear            Ears- Hearing, canals-normal  Nose- Clear, no-Septal dev, mucus, polyps, erosion, perforation             Throat- Mallampati II , mucosa -tongue slightly coated , drainage- none, tonsils-                  atrophic, + missing teeth    Neck- flexible , trachea midline, no stridor , thyroid nl, carotid no bruit Chest - symmetrical excursion , unlabored           Heart/CV- RRR , no murmur , no gallop  , no rub, nl s1 s2                           - JVD- none , edema- none, stasis changes- none, varices- none           Lung-  unlabored, wheeze -none, cough-none ,  dullness-none, rub- none, + few                           crackles right posterior base           Chest wall-+ pacemaker  (deep brain stimulator) left Abd-  Br/ Gen/ Rectal- Not done, not indicated Extrem- cyanosis- none, clubbing, none, atrophy- none, strength- nl,  Neuro- + tremor and head bob,+ mild dysphonia, + unsteady

## 2018-04-20 NOTE — Assessment & Plan Note (Signed)
Esophageal dilatation was done according to her most recent EGD report.  She has not recognized recent significant reflux or aspiration event.  Her tremor and instability make reflux more problematic.  I reemphasized reflux precautions with her today.

## 2018-04-22 ENCOUNTER — Other Ambulatory Visit (HOSPITAL_COMMUNITY): Payer: Self-pay | Admitting: *Deleted

## 2018-04-22 ENCOUNTER — Other Ambulatory Visit (HOSPITAL_COMMUNITY)
Admit: 2018-04-22 | Discharge: 2018-04-22 | Disposition: A | Discharge status: Home / Self Care | Payer: Medicare Other | Source: Other Acute Inpatient Hospital | Attending: Internal Medicine | Admitting: Internal Medicine

## 2018-04-22 DIAGNOSIS — L97311 Non-pressure chronic ulcer of right ankle limited to breakdown of skin: Secondary | ICD-10-CM | POA: Insufficient documentation

## 2018-04-22 DIAGNOSIS — E11622 Type 2 diabetes mellitus with other skin ulcer: Secondary | ICD-10-CM | POA: Diagnosis not present

## 2018-04-26 ENCOUNTER — Other Ambulatory Visit: Payer: Self-pay | Admitting: Obstetrics & Gynecology

## 2018-04-27 ENCOUNTER — Other Ambulatory Visit (HOSPITAL_COMMUNITY): Payer: Self-pay | Admitting: Obstetrics & Gynecology

## 2018-04-27 LAB — AEROBIC CULTURE W GRAM STAIN (SUPERFICIAL SPECIMEN)
Culture: NO GROWTH
Gram Stain: NONE SEEN

## 2018-04-29 DIAGNOSIS — E11622 Type 2 diabetes mellitus with other skin ulcer: Secondary | ICD-10-CM | POA: Diagnosis not present

## 2018-05-03 ENCOUNTER — Other Ambulatory Visit (HOSPITAL_COMMUNITY): Payer: Self-pay | Admitting: Internal Medicine

## 2018-05-03 ENCOUNTER — Ambulatory Visit (HOSPITAL_COMMUNITY)
Admission: RE | Admit: 2018-05-03 | Discharge: 2018-05-03 | Disposition: A | Discharge status: Home / Self Care | Payer: Medicare Other | Source: Ambulatory Visit | Attending: Internal Medicine | Admitting: Internal Medicine

## 2018-05-03 DIAGNOSIS — L97311 Non-pressure chronic ulcer of right ankle limited to breakdown of skin: Secondary | ICD-10-CM | POA: Diagnosis present

## 2018-05-06 DIAGNOSIS — E11622 Type 2 diabetes mellitus with other skin ulcer: Secondary | ICD-10-CM | POA: Diagnosis not present

## 2018-05-12 ENCOUNTER — Encounter: Payer: Self-pay | Admitting: Neurology

## 2018-05-13 ENCOUNTER — Encounter (HOSPITAL_BASED_OUTPATIENT_CLINIC_OR_DEPARTMENT_OTHER): Payer: Medicare Other | Attending: Physician Assistant

## 2018-05-13 DIAGNOSIS — Z794 Long term (current) use of insulin: Secondary | ICD-10-CM | POA: Insufficient documentation

## 2018-05-13 DIAGNOSIS — G4730 Sleep apnea, unspecified: Secondary | ICD-10-CM | POA: Insufficient documentation

## 2018-05-13 DIAGNOSIS — L97312 Non-pressure chronic ulcer of right ankle with fat layer exposed: Secondary | ICD-10-CM | POA: Insufficient documentation

## 2018-05-13 DIAGNOSIS — J449 Chronic obstructive pulmonary disease, unspecified: Secondary | ICD-10-CM | POA: Insufficient documentation

## 2018-05-13 DIAGNOSIS — E11622 Type 2 diabetes mellitus with other skin ulcer: Secondary | ICD-10-CM | POA: Insufficient documentation

## 2018-05-13 DIAGNOSIS — I10 Essential (primary) hypertension: Secondary | ICD-10-CM | POA: Insufficient documentation

## 2018-05-18 DIAGNOSIS — Z794 Long term (current) use of insulin: Secondary | ICD-10-CM | POA: Diagnosis not present

## 2018-05-18 DIAGNOSIS — J449 Chronic obstructive pulmonary disease, unspecified: Secondary | ICD-10-CM | POA: Diagnosis not present

## 2018-05-18 DIAGNOSIS — E11622 Type 2 diabetes mellitus with other skin ulcer: Secondary | ICD-10-CM | POA: Diagnosis present

## 2018-05-18 DIAGNOSIS — L97312 Non-pressure chronic ulcer of right ankle with fat layer exposed: Secondary | ICD-10-CM | POA: Diagnosis not present

## 2018-05-18 DIAGNOSIS — G4730 Sleep apnea, unspecified: Secondary | ICD-10-CM | POA: Diagnosis not present

## 2018-05-18 DIAGNOSIS — I10 Essential (primary) hypertension: Secondary | ICD-10-CM | POA: Diagnosis not present

## 2018-05-24 NOTE — Progress Notes (Signed)
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 ESOPHAGOGASTRODUODENOSCOPY (EGD) WITH PROPOFOL N/A 03/04/2015   Dr.[name]- schatzki's ring, hiatal hernia, scattered erosions bx= chronic inactive gastritis  . ESOPHAGOGASTRODUODENOSCOPY (EGD) WITH PROPOFOL N/A 05/06/2017   Procedure: ESOPHAGOGASTRODUODENOSCOPY (EGD) WITH PROPOFOL;  Surgeon: Daneil Dolin, MD;  [facility];  Service: Endoscopy;  Laterality: N/A;  10:15am  . FINGER SURGERY    . HEMORRHOID BANDING  2017   Dr.[name]  . LEFT ANKLE LIGAMENT REPAIR     x 2  . LEFT ELBOW REPAIR    . MALONEY DILATION N/A 05/06/2017   Procedure: Venia Minks DILATION;  Surgeon: Daneil Dolin, MD;  [facility];  Service: Endoscopy;  Laterality: N/A;  . MAXIMUM ACCESS (MAS)POSTERIOR LUMBAR INTERBODY FUSION (PLIF) 1 LEVEL N/A 04/24/2014   Procedure: Lumbar four-five Maximum access Surgery posterior lumbar interbody fusion;  Surgeon: [name], MD;  Location: [place] [facility];  Service: Neurosurgery;  Laterality: N/A;  Lumbar four-five Maximum access Surgery posterior lumbar interbody fusion  . MULTIPLE EXTRACTIONS WITH ALVEOLOPLASTY N/A 06/18/2017   Procedure: MULTIPLE EXTRACTION;  Surgeon: Diona Browner, DDS;  Location: [facility];  Service: Oral Surgery;  Laterality: N/A;  . PARTIAL HYSTERECTOMY    . POLYPECTOMY  03/04/2015   Procedure: POLYPECTOMY (descending colon);  Surgeon: Daneil Dolin, MD;  [facility];  Service: Endoscopy;;  . PULSE GENERATOR IMPLANT Bilateral 12/15/2013   Procedure: BILATERAL PULSE GENERATOR IMPLANT;  Surgeon: [name], MD;  Location: [place]  [facility];  Service: Neurosurgery;  Laterality: Bilateral;  BILATERAL  . RIGHT BREAST CYST  benign  . SUBTHALAMIC STIMULATOR INSERTION Bilateral 12/04/2013   Procedure: Bilateral Deep brain stimulator placement;  Surgeon: [name], MD;  Location: [place] [facility];  Service: Neurosurgery;  Laterality: Bilateral;  Bilateral Deep brain stimulator placement  . TONSILLECTOMY      Social History   Socioeconomic History  . Marital status: Legally Separated    Spouse name: Not on file  . Number of children: Not on file  . Years of education: Not on file  . Highest education level: Bachelor's degree (e.g., BA, AB, BS)  Occupational History  . Occupation: retired  Scientific laboratory technician  . Financial resource strain: Not on file  . Food insecurity:    Worry: Not on file    Inability: Not on file  . Transportation needs:    Medical: Not on file    Non-medical: Not on file  Tobacco Use  . Smoking status: Former Smoker    Packs/day: 0.30    Years: 10.00    Pack years: 3.00    Types: Cigarettes    Last attempt to quit: 04/23/1993    Years since quitting: 25.1  . Smokeless tobacco: Never Used  . Tobacco comment: quit smoking 25+yrs ago  Substance and Sexual Activity  . Alcohol use: No    Alcohol/week: 0.0 standard drinks  . Drug use: No  . Sexual activity: Not Currently  Lifestyle  . Physical activity:    Days per week: Not on file    Minutes per session: Not on file  . Stress: Not on file  Relationships  . Social connections:    Talks on phone: Not on file    Gets together: Not on file    Attends religious service: Not on file    Active member of club or organization: Not on file    Attends meetings of clubs or organizations: Not on file    Relationship status: Not on file  . Intimate partner violence:    Fear of current or ex partner: Not on file    Emotionally abused: Not on file    Physically abused: Not on file    Forced sexual activity: Not on file  Other Topics Concern  .  Not on file  Social History Narrative   Married, 1 daughte, living.  Lives with husband in [place], [place].   1 child died brain tumor at age 89.   Two grandchildren.   Works at [employer], patient currently lost her job.   Retired 2011.   No tobacco.   Alcohol: none in 30 yrs.  Distant history of heavy use.   No drug use.    Family Status  Relation Name Status  . Father  Deceased       lung cancer, diabetes  . Mother  Deceased       COPD, diabetes  . Son  Deceased at age 36       brain tumor (neuroblastoma)  . Sister  Deceased       blood clot  . Daughter  Alive       healthy  . Brother  (Not Specified)  . Neg Hx  (Not Specified)    Review of Systems Review of Systems  Constitutional: Negative.   HENT: Negative.   Eyes: Positive for redness (some irritation that she attributes to allergies).  Respiratory: Positive for shortness of breath (intermittent with asthma).   Cardiovascular: Negative.   Gastrointestinal: Negative.   Genitourinary: Negative.   Skin: Negative.   Endo/Heme/Allergies: Negative.  Psychiatric/Behavioral: Negative.       Objective:   VITALS:   Vitals:   05/26/18 0926  BP: 128/64  Pulse: 90  SpO2: 94%   Wt Readings from Last 3 Encounters:  04/20/18 173 lb (78.5 kg)  03/17/18 173 lb (78.5 kg)  01/18/18 183 lb 6.4 oz (83.2 kg)      Gen: Appears stated age and in NAD.   HEENT: Normocephalic, AT.  MMM.  She is edentulous.   Cardiovascular: Regular rate and rhythm.  Lungs: clear today bilaterally Neck: There are no carotid bruits noted bilaterally.   NEUROLOGICAL:  Orientation: The patient is alert and oriented x 3.  Cranial nerves: There is good facial symmetry.  Speech is fluent and clear but has a pseudobulbar quality. Soft palate rises symmetrically and there is no tongue deviation. Hearing is intact to conversational tone.  Tone: Tone is good throughout.  Sensation: Sensation is intact to light touch  throughout. Coordination: The patient has no dysdiadichokinesia.   Motor: Strength is 5/5 in the UE.   Gait and Station: The patient is in her WC and opted not to ambulate today as she states that she really doesn't do that anymore   MOVEMENT EXAM:  Tremor: Patient has tremor of the bilateral upper extremities, but has a significant ataxic component to it as well.  The right is significantly worse than the left.  DBS programming was performed today which is described in more detail in a separate programming procedural notes.  In brief, tremor was markedly improved on the right following programming.  I did not reprogram the left body (right brain, primarily because of the fact that battery was end-of-life.   Assessment/Plan:   1.  Essential Tremor.  -This is evidenced by the symmetrical nature and longstanding hx of gradually getting worse.  -The patient is status post bilateral VIM DBS on 12/04/2013 with bilateral generator placement on 12/15/2013.  Postoperative imaging indicates that leads are perhaps just slightly too lateral and more inferior than anticipated.  -I was worried that the device was contributing to mood change.  However, the device was turned off for 5 weeks and the facility reports that her mood was not only better, but that it was worse.  In addition, tremor was markedly worse.  I spent a significant amount of time reprogramming the device today on the left brain, and noted significant improvement in tremor.  I changed her from a bipolar to monopolar configuration, which seemed to help.  -Reiterated to the patient that Depakote and quetiapine both can contribute to tremor.  -She asked me to sign a prescription for new weighted utensils, which I did.  -Sent a referral to Dr. Vertell Limber to change her battery, given that it is end-of-life. 2.  Diabetic peripheral neuropathy  -on gabapentin with pain management.  Feels that neuropathy is getting worse and told her to discuss with pain  management. 3.  L4 radiculopathy  -S/P surgery and back pain was better but has returned and on opioids still 4.  Multifactorial balance d/o  -There is no longer walking except with physical therapy. 5.  I will see her back in the next 8 weeks, after her battery change that has been completed.

## 2018-05-26 ENCOUNTER — Encounter: Payer: Self-pay | Admitting: Neurology

## 2018-05-26 ENCOUNTER — Other Ambulatory Visit (HOSPITAL_COMMUNITY): Payer: Self-pay | Admitting: Internal Medicine

## 2018-05-26 ENCOUNTER — Telehealth: Payer: Self-pay | Admitting: Neurology

## 2018-05-26 ENCOUNTER — Ambulatory Visit (INDEPENDENT_AMBULATORY_CARE_PROVIDER_SITE_OTHER): Payer: Medicare Other | Admitting: Neurology

## 2018-05-26 VITALS — BP 128/64 | HR 90

## 2018-05-26 DIAGNOSIS — G250 Essential tremor: Secondary | ICD-10-CM | POA: Diagnosis not present

## 2018-05-26 DIAGNOSIS — E11622 Type 2 diabetes mellitus with other skin ulcer: Secondary | ICD-10-CM | POA: Diagnosis not present

## 2018-05-26 DIAGNOSIS — L98499 Non-pressure chronic ulcer of skin of other sites with unspecified severity: Secondary | ICD-10-CM

## 2018-05-26 MED ORDER — AMBULATORY NON FORMULARY MEDICATION: 0 refills | Status: DC

## 2018-05-26 NOTE — Procedures (Signed)
DBS Programming was performed.    Total time spent programming was 40 minutes.  Device was confirmed to be on  Soft start was confirmed to be on.  Impedences were checked and were within normal limits.  Battery was checked and was determined to be at end of life on the right only (2.57 on the right and 2.77 on the left).    Final settings were as follows, after multiple settings were tried on the left brain:  Left brain electrode:     1-C+  ; Amplitude  2.9   V   ; Pulse width 60 microseconds;   Frequency   130   Hz.  Right brain electrode:     3-2+          ; Amplitude   3.7  V ;  Pulse width 90  microseconds;  Frequency   140    Hz.

## 2018-05-26 NOTE — Patient Instructions (Signed)
1. We are referring you to Dr Vertell Limber at [facility]. They will call you directly to set up an appointment date and time. If you do not hear from them they can be contacted directly at [phone].

## 2018-05-26 NOTE — Telephone Encounter (Signed)
Referral faxed to [facility] at [phone] with confirmation received. They will contact the patient to schedule.  

## 2018-05-30 ENCOUNTER — Ambulatory Visit (HOSPITAL_COMMUNITY)
Admission: RE | Admit: 2018-05-30 | Discharge: 2018-05-30 | Disposition: A | Discharge status: Home / Self Care | Payer: Medicare Other | Source: Ambulatory Visit | Attending: Surgery | Admitting: Surgery

## 2018-05-30 DIAGNOSIS — L98499 Non-pressure chronic ulcer of skin of other sites with unspecified severity: Secondary | ICD-10-CM | POA: Diagnosis not present

## 2018-05-31 ENCOUNTER — Telehealth: Payer: Self-pay | Admitting: Neurology

## 2018-05-31 NOTE — Telephone Encounter (Signed)
Received note from Dr. Melven Sartorius office that patient has an appt on 06/27/2018 at 11:45 am.

## 2018-06-02 ENCOUNTER — Encounter: Payer: Self-pay | Admitting: Neurology

## 2018-06-02 DIAGNOSIS — E11622 Type 2 diabetes mellitus with other skin ulcer: Secondary | ICD-10-CM | POA: Diagnosis not present

## 2018-06-06 ENCOUNTER — Encounter: Payer: Self-pay | Admitting: Cardiovascular Disease

## 2018-06-06 ENCOUNTER — Ambulatory Visit (INDEPENDENT_AMBULATORY_CARE_PROVIDER_SITE_OTHER): Payer: Medicare Other | Admitting: Cardiovascular Disease

## 2018-06-06 VITALS — BP 133/82 | HR 85 | Ht 64.0 in | Wt 194.0 lb

## 2018-06-06 DIAGNOSIS — R600 Localized edema: Secondary | ICD-10-CM

## 2018-06-06 DIAGNOSIS — R000 Tachycardia, unspecified: Secondary | ICD-10-CM | POA: Diagnosis not present

## 2018-06-06 DIAGNOSIS — Z9889 Other specified postprocedural states: Secondary | ICD-10-CM | POA: Diagnosis not present

## 2018-06-06 DIAGNOSIS — I10 Essential (primary) hypertension: Secondary | ICD-10-CM

## 2018-06-06 NOTE — Patient Instructions (Signed)
Medication Instructions:  Continue all current medications.   Follow-Up: Your physician wants you to follow up in:  1 year.  You will receive a reminder letter in the mail one-two months in advance.  If you don't receive a letter, please call our office to schedule the follow up appointment   Any Other Special Instructions Will Be Listed Below (If Applicable).  If you need a refill on your cardiac medications before your next appointment, please call your pharmacy.

## 2018-06-06 NOTE — Progress Notes (Signed)
SUBJECTIVE:  The patient returns for follow-up of tachycardia and leg swelling.  Additional history includes essential tremor with deep brain stimulator placementanddiabetic peripheral neuropathy as well as GERD and esophageal dilatation.  She has not been in the hospital recently.  She denies chest pain and palpitations.  She has had some recent issues with asthma and does use albuterol and Breo.  She denies orthopnea and paroxysmal nocturnal dyspnea.   Soc Hx: Divorced after 65 yrs of marriage. Her ex-husband is also my patient.  Review of Systems: As per "subjective", otherwise negative.  Allergies  Allergen Reactions  . Invokana [Canagliflozin] Other (See Comments)    Yeast infectios  . Losartan Other (See Comments)    Angioedema   . Metformin And Related Diarrhea  . Latex Other (See Comments)    Other Reaction: redness, blisters  . Other Other (See Comments)    Air fresheners - on MAR  . Oxycodone-Acetaminophen Itching and Other (See Comments)    TYLOX. Caused internal itching per patient   . Mucinex [Guaifenesin Er] Other (See Comments)    MUCINEX REACTION: asthma attacks    Current Outpatient Medications  Medication Sig Dispense Refill  . acetaminophen (TYLENOL) 325 MG tablet Take 650 mg by mouth 3 (three) times daily.    Marland Kitchen albuterol (PROAIR HFA) 108 (90 Base) MCG/ACT inhaler Inhale 2 puffs every 6 hours for asthma - rescue inhaler (Patient taking differently: Inhale 2 puffs into the lungs every 6 (six) hours. ) 1 Inhaler 12  . albuterol (PROVENTIL) (2.5 MG/3ML) 0.083% nebulizer solution Take 2.5 mg by nebulization every 6 (six) hours as needed for wheezing or shortness of breath.    Marland Kitchen alum & mag hydroxide-simeth (MAALOX/MYLANTA) 200-200-20 MG/5ML suspension Take by mouth every 6 (six) hours as needed for indigestion or heartburn.    . AMBULATORY NON FORMULARY MEDICATION [place], Spoon, and Knife  DX: G25.0 1 Device 0  . aspirin 81 MG chewable tablet  Chew 81 mg by mouth daily. Not taking until after Monday 07/12/17 due to surgery on her trigger finger    . baclofen (LIORESAL) 10 MG tablet Take 10 mg by mouth at bedtime.    . divalproex (DEPAKOTE) 250 MG DR tablet Take 250 mg by mouth daily. In the morning with 500 mg dose    . divalproex (DEPAKOTE) 500 MG DR tablet Take 500 mg by mouth 3 (three) times daily.     Marland Kitchen docusate sodium (COLACE) 100 MG capsule Take 1 capsule (100 mg total) by mouth daily. 30 capsule 0  . fluticasone furoate-vilanterol (BREO ELLIPTA) 100-25 MCG/INH AEPB Inhale 1 puff into the lungs daily. 1 each 11  . gabapentin (NEURONTIN) 300 MG capsule Take 300-600 mg by mouth See admin instructions. Take 300 mg in the morning and 600 mg at bedtime    . GENERLAC 10 GM/15ML SOLN     . Glucose-Vitamin C-Vitamin D (TRUEPLUS GLUCOSE) CHEW Chew by mouth as needed.    Marland Kitchen guaifenesin (ROBITUSSIN) 100 MG/5ML syrup Take 200 mg by mouth every 6 (six) hours as needed for cough.    Marland Kitchen HYDROcodone-acetaminophen (NORCO/VICODIN) 5-325 MG tablet Take 1 tablet by mouth 3 (three) times daily.    . hydrocortisone cream 1 % Apply 1 application topically 2 (two) times daily.    Marland Kitchen ibuprofen (ADVIL,MOTRIN) 200 MG tablet Take 200 mg by mouth every 6 (six) hours as needed.    . insulin glargine (LANTUS) 100 UNIT/ML injection Inject 25 Units into the skin  at bedtime.     Marland Kitchen ipratropium (ATROVENT) 0.03 % nasal spray 1-2 puffs each nostril 3 times daily, before meals (Patient taking differently: Place 1-2 sprays into both nostrils 3 (three) times daily. ) 30 mL 11  . lactulose (CHRONULAC) 10 GM/15ML solution Take by mouth 3 (three) times daily.    Marland Kitchen levothyroxine (SYNTHROID, LEVOTHROID) 75 MCG tablet TAKE (1) TABLET BY MOUTH ONCE DAILY BEFORE BREAKFAST. 30 tablet 5  . loperamide (IMODIUM A-D) 2 MG tablet Take 2 mg by mouth 4 (four) times daily as needed for diarrhea or loose stools.     . magnesium hydroxide (MILK OF MAGNESIA) 400 MG/5ML suspension Take 30 mLs by  mouth at bedtime as needed for mild constipation.     . Menthol, Topical Analgesic, (BIOFREEZE) 4 % GEL Apply 1 application topically 2 (two) times daily. Apply to lower back and base of right thumb twice daily.    . metoprolol succinate (TOPROL-XL) 50 MG 24 hr tablet Take 50 mg by mouth daily. Take with or immediately following a meal.    . Multiple Vitamins-Minerals (MULTIVITAMINS THER. W/MINERALS) TABS tablet TAKE ONE TABLET BY MOUTH ONCE DAILY. 30 each 11  . NOVOLOG FLEXPEN 100 UNIT/ML FlexPen Inject 8 Units into the skin 3 (three) times daily before meals.     . Omega 3 1000 MG CAPS TAKE (1) CAPSULE BY MOUTH ONCE DAILY. 30 capsule 11  . pantoprazole (PROTONIX) 40 MG tablet Take 40 mg by mouth daily.    . polyethylene glycol (MIRALAX / GLYCOLAX) packet Take 17 g by mouth daily as needed for moderate constipation. 14 each 0  . potassium chloride SA (K-DUR,KLOR-CON) 20 MEQ tablet Take 20 mEq by mouth daily.    . primidone (MYSOLINE) 50 MG tablet Take 50 mg by mouth at bedtime.    Marland Kitchen QUEtiapine (SEROQUEL) 100 MG tablet Take 100 mg by mouth at bedtime.    Marland Kitchen Respiratory Therapy Supplies (FLUTTER) DEVI Use flutter device 3 times a day 1 each 0  . simvastatin (ZOCOR) 5 MG tablet Take 1 tablet by mouth daily.    . sitaGLIPtin (JANUVIA) 100 MG tablet Take 100 mg by mouth daily.    . sodium chloride (OCEAN) 0.65 % SOLN nasal spray Take 1-2 sprays in nostril every hour as needed for rhinitis 30 mL PRN  . sodium chloride 1 g tablet Take 1 g by mouth 2 (two) times daily.    . sucralfate (CARAFATE) 1 g tablet Take 1 g by mouth 2 (two) times daily.      No current facility-administered medications for this visit.     Past Medical History:  Diagnosis Date  . Allergic rhinitis    uses Nasonex daily as needed  . Allergy to angiotensin receptor blockers (ARB) 01/22/2014   Angioedema  . Anxiety    takes Ativan daily as needed  . Aspiration pneumonia ([place])   . Asthma    Albuterol inhaler and Neb daily  as needed  . 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 [name], residual palpitations  . Bipolar 1 disorder ([place])   . Chronic back pain    reason unknown  . Constipation   . COPD (chronic obstructive pulmonary disease) ([place])   . DDD (degenerative disc disease), lumbar   . Depression    takes Zoloft daily as well as Cymbalta  . Diabetes mellitus    takes NOvolin daily  . Dysphagia   . Dysrhythmia  SVT ==ablation done by Dr. Rayann Heman 2007  . Esophageal reflux    takes Zantac daily  . Frequent headaches   . History of bronchitis Dec 2014  . Hypertension    takes Losartan daily  . Hypothyroidism    takes Synthroid daily  . Joint pain   . Joint swelling    left ankle  . Low sodium syndrome   . Lumbar radiculopathy   . Seizures ([place]) 04-05-13   one time ran out of Primidone and had stopped it cold [place]  . SVT (supraventricular tachycardia) ([place])   . Tremor, essential    takes Mysoline and Neurontin daily.  . Vocal cord dysfunction   . Weakness    in left arm    Past Surgical History:  Procedure Laterality Date  . ABDOMINAL HYSTERECTOMY    . ABLATION FOR AVNRT    . APPENDECTOMY    . BARTHOLIN GLAND CYST EXCISION     x 2  . BIOPSY  03/04/2015   Procedure: BIOPSY (Duodenal, Gastric);  Surgeon: Daneil Dolin, MD;  [facility];  Service: Endoscopy;;  . BREAST SURGERY    . CESAREAN SECTION    . CHOLECYSTECTOMY    . COLONOSCOPY  01/16/2004   [facility]:XNATFT rectum/colon  . COLONOSCOPY WITH PROPOFOL N/A 03/04/2015   Dr.[name]- internal hemorrhoids, pancolonic diverticulosis, colonic polyp= tubular adenoma  . DEEP BRAIN STIMULATOR PLACEMENT    . DENTAL SURGERY    . ESOPHAGEAL DILATION N/A 03/04/2015   Procedure: ESOPHAGEAL DILATION WITH 54FR MALONEY DILATOR;  Surgeon: Daneil Dolin, MD;  Location[facility];  Service: Endoscopy;  Laterality: N/A;  . ESOPHAGOGASTRODUODENOSCOPY (EGD) WITH ESOPHAGEAL DILATION  04/03/2002    [facility]U:[id number]'W ring, otherwise normal esophagus, status post dilation with 56 F/Normal stomach  . ESOPHAGOGASTRODUODENOSCOPY (EGD) WITH ESOPHAGEAL DILATION N/A 11/08/2012   Dr. [name]:schatzki's ring s/p dilation/hiatal hernia  . ESOPHAGOGASTRODUODENOSCOPY (EGD) WITH PROPOFOL N/A 03/04/2015   Dr.[name]- schatzki's ring, hiatal hernia, scattered erosions bx= chronic inactive gastritis  . ESOPHAGOGASTRODUODENOSCOPY (EGD) WITH PROPOFOL N/A 05/06/2017   Procedure: ESOPHAGOGASTRODUODENOSCOPY (EGD) WITH PROPOFOL;  Surgeon: Daneil Dolin, MD;  [facility];  Service: Endoscopy;  Laterality: N/A;  10:15am  . FINGER SURGERY    . HEMORRHOID BANDING  2017   Dr.[name]  . LEFT ANKLE LIGAMENT REPAIR     x 2  . LEFT ELBOW REPAIR    . MALONEY DILATION N/A 05/06/2017   Procedure: Venia Minks DILATION;  Surgeon: Daneil Dolin, MD;  [facility];  Service: Endoscopy;  Laterality: N/A;  . MAXIMUM ACCESS (MAS)POSTERIOR LUMBAR INTERBODY FUSION (PLIF) 1 LEVEL N/A 04/24/2014   Procedure: Lumbar four-five Maximum access Surgery posterior lumbar interbody fusion;  Surgeon: [name], MD;  Location: [place] [facility];  Service: Neurosurgery;  Laterality: N/A;  Lumbar four-five Maximum access Surgery posterior lumbar interbody fusion  . MULTIPLE EXTRACTIONS WITH ALVEOLOPLASTY N/A 06/18/2017   Procedure: MULTIPLE EXTRACTION;  Surgeon: Diona Browner, DDS;  Location: [facility];  Service: Oral Surgery;  Laterality: N/A;  . PARTIAL HYSTERECTOMY    . POLYPECTOMY  03/04/2015   Procedure: POLYPECTOMY (descending colon);  Surgeon: Daneil Dolin, MD;  [facility];  Service: Endoscopy;;  . PULSE GENERATOR IMPLANT Bilateral 12/15/2013   Procedure: BILATERAL PULSE GENERATOR IMPLANT;  Surgeon: [name], MD;  Location: [facility];  Service: Neurosurgery;  Laterality: Bilateral;  BILATERAL  . RIGHT BREAST CYST     benign  . SUBTHALAMIC STIMULATOR INSERTION Bilateral 12/04/2013   Procedure: Bilateral Deep  brain  stimulator placement;  Surgeon: [name], MD;  Location: [facility];  Service: Neurosurgery;  Laterality: Bilateral;  Bilateral Deep brain stimulator placement  . TONSILLECTOMY      Social History   Socioeconomic History  . Marital status: Legally Separated    Spouse name: Not on file  . Number of children: Not on file  . Years of education: Not on file  . Highest education level: Bachelor's degree (e.g., BA, AB, BS)  Occupational History  . Occupation: retired  Scientific laboratory technician  . Financial resource strain: Not on file  . Food insecurity:    Worry: Not on file    Inability: Not on file  . Transportation needs:    Medical: Not on file    Non-medical: Not on file  Tobacco Use  . Smoking status: Former Smoker    Packs/day: 0.30    Years: 10.00    Pack years: 3.00    Types: Cigarettes    Last attempt to quit: 04/23/1993    Years since quitting: 25.1  . Smokeless tobacco: Never Used  . Tobacco comment: quit smoking 25+yrs ago  Substance and Sexual Activity  . Alcohol use: No    Alcohol/week: 0.0 standard drinks  . Drug use: No  . Sexual activity: Not Currently  Lifestyle  . Physical activity:    Days per week: Not on file    Minutes per session: Not on file  . Stress: Not on file  Relationships  . Social connections:    Talks on phone: Not on file    Gets together: Not on file    Attends religious service: Not on file    Active member of club or organization: Not on file    Attends meetings of clubs or organizations: Not on file    Relationship status: Not on file  . Intimate partner violence:    Fear of current or ex partner: Not on file    Emotionally abused: Not on file    Physically abused: Not on file    Forced sexual activity: Not on file  Other Topics Concern  . Not on file  Social History Narrative   Married, 1 daughte, living.  Lives with husband in [place], [place].   1 child died brain tumor at age 30.   Two grandchildren.   Works at [employer], patient currently lost her job.   Retired 2011.   No tobacco.   Alcohol: none in 30 yrs.  Distant history of heavy use.   No drug use.     Vitals:   06/06/18 1308  BP: 133/82  Pulse: 85  SpO2: 98%  Weight: 194 lb (88 kg)  Height: 5\' 4"  (1.626 m)    Wt Readings from Last 3 Encounters:  06/06/18 194 lb (88 kg)  04/20/18 173 lb (78.5 kg)  03/17/18 173 lb (78.5 kg)     PHYSICAL EXAM General: NAD HEENT: Mild head tremor noted. Neck: No JVD, no thyromegaly. Lungs: Diffusely diminished breath sounds with scattered expiratory wheezes. CV: Regular rate and rhythm, normal S1/S2, no S3/S4, no murmur. No significant lower extremity edema.  Abdomen: Soft, nontender, no distention.  Neurologic: Alert and oriented.  Psych: Normal affect. Skin: Normal. Musculoskeletal: No gross deformities.    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 Symptomatically stable without recurrences. Continue metoprolol for tachycardia.  2. Essential HTN: BP controlled today. No changes.  3.  Bilateral leg edema: Due to chronic venous insufficiency. No edema today.  We previously talked about the possible use of compression stockings but she deferred.    Disposition: Follow up 1 yr   Kate Sable, M.D., F.A.C.C.

## 2018-06-07 ENCOUNTER — Encounter (HOSPITAL_BASED_OUTPATIENT_CLINIC_OR_DEPARTMENT_OTHER): Payer: Medicare Other | Attending: Internal Medicine

## 2018-06-07 DIAGNOSIS — J449 Chronic obstructive pulmonary disease, unspecified: Secondary | ICD-10-CM | POA: Insufficient documentation

## 2018-06-07 DIAGNOSIS — Z794 Long term (current) use of insulin: Secondary | ICD-10-CM | POA: Diagnosis not present

## 2018-06-07 DIAGNOSIS — G4730 Sleep apnea, unspecified: Secondary | ICD-10-CM | POA: Diagnosis not present

## 2018-06-07 DIAGNOSIS — I10 Essential (primary) hypertension: Secondary | ICD-10-CM | POA: Insufficient documentation

## 2018-06-07 DIAGNOSIS — E11622 Type 2 diabetes mellitus with other skin ulcer: Secondary | ICD-10-CM | POA: Diagnosis present

## 2018-06-07 DIAGNOSIS — L97312 Non-pressure chronic ulcer of right ankle with fat layer exposed: Secondary | ICD-10-CM | POA: Insufficient documentation

## 2018-06-07 DIAGNOSIS — Z87891 Personal history of nicotine dependence: Secondary | ICD-10-CM | POA: Diagnosis not present

## 2018-06-09 ENCOUNTER — Other Ambulatory Visit: Payer: Self-pay | Admitting: Neurosurgery

## 2018-06-11 NOTE — H&P (View-Only) (Signed)
Patient ID:   [id number] Patient: [name]  Date of Birth: 1950/09/19 Visit Type: Office Visit   Date: 06/08/2018 12:30 PM Provider: Marchia Meiers. Vertell [place] MD   This 68 year old female presents for Essential tremor.  HISTORY OF PRESENT ILLNESS:  1.  Essential tremor  Patient returns to discuss replacement of deep brain stimulator implanted pulse generator.  Interrogation by [name] at on the 20th revealed 2.57 on the right and 2.77 on the left. Patient endorses that her DBS has significantly reduced her tremor.   12/05/2013 bilateral DBS for essential tremor         Medical/Surgical/Interim History Reviewed, no change.  Last detailed document date:09/18/2013.     Family History:  Reviewed, no changes.  Last detailed document date:09/18/2013.   Social History: Reviewed, no changes. Last detailed document date: 09/18/2013.    MEDICATIONS: (added, continued or stopped this visit) Started Medication Directions Instruction Stopped   albuterol sulfate 2.5 mg/3 mL (0.083 %) solution for nebulization inhale 3 milliliters by nebulization route  every 6 hours as needed for wheezing or shortness of breath     aspirin 81 mg tablet,delayed release take 1 tablet by oral route  every day     baclofen 10 mg tablet take 1 tablet by oral route at bedtime     Banophen 25 mg capsule take 1 capsule by oral route  every 4 - 6 hours as needed     Biofreeze (menthol) 4 % topical gel      Breo Ellipta 100 mcg-25 mcg/dose powder for inhalation inhale 1 puff by inhalation route  every day at the same time each day     Carafate 1 gram tablet take 1 tablet by oral route 4 times every day on an empty stomach 1 hour before meals and at bedtime     divalproex ER 500 mg tablet,extended release 24 hr take 4 tablet by oral route  every day     docusate sodium 100 mg capsule take 1 capsule by oral route  every day at bedtime as needed     Farxiga 5 mg tablet take 1 tablet by oral route  every day in the  morning     Fish Oil 1,000 mg (120 mg-180 mg) capsule      gabapentin 300 mg capsule take 1 capsule by oral route 3 times every day    03/02/2018 hydrocodone 5 mg-acetaminophen 325 mg tablet take 1 tablet by oral route  every 8 hours as needed for pain (DNF 03/19/18)    03/02/2018 hydrocodone 5 mg-acetaminophen 325 mg tablet take 1 tablet by oral route  every 8 hours as needed for pain (DNF 04/18/18)    05/05/2018 hydrocodone 7.5 mg-acetaminophen 325 mg tablet take 1 tablet by oral route  every 8 hours as needed for pain (DNF until 05/18/2018)     Januvia 100 mg tablet take 1 tablet by oral route  every day     Lamictal 100 mg tablet take 1 tablet by oral route  every day     levothyroxine 75 mcg tablet take 1 tablet by oral route  every day     loperamide 2 mg tablet      loratadine 10 mg tablet take 1 tablet by oral route  every day     lorazepam 1 mg tablet take 1 tablet by oral route 3 times every day as needed     metoprolol succinate ER 50 mg tablet,extended release 24 hr take 1 tablet by  oral route 2 times every day     Milk of Magnesia take 30 milliliter by oral route  every day as needed, followed by a full glass (8 oz) of liquid     Mintox take 10 milliliter by oral route between meals and at bedtime as needed     nitroglycerin 0.4 mg sublingual tablet place 1 tablet by sublingual route at 1st sign of attack; may repeat every 5 minutes up to 3 tabs; if norelief seek medical help     Novolog Flexpen 100 unit/mL subcutaneous inject by subcutaneous route per prescriber's instructions. Insulin dosing requires individualization.     omeprazole 20 mg capsule,delayed release take 1 capsule by oral route  every day 30 minutes to 1 hour before a meal     oxybutynin chloride ER 10 mg tablet,extended release 24 hr take 1 tablet by oral route  every day     pantoprazole 40 mg tablet,delayed release take 1 tablet by oral route  every day     polyethylene glycol 3350 17 gram/dose oral powder take (17G)   by oral route  every day mixed with 8 oz. water, juice, soda, coffee or tea     potassium chloride ER 20 mEq tablet,extended release take 1 tablet by oral route 2 times every day with food     primidone 50 mg tablet take 3 tablet by oral route 2 times every day     ProAir HFA 90 mcg/actuation aerosol inhaler inhale 2 puff by inhalation route  every 6 hours as needed     quetiapine 100 mg tablet take 1 tablet by oral route  every day     ranitidine 150 mg tablet take 1 tablet by oral route  every day     Robafen take 10 milliliter by oral route  every 4 hours as needed     simvastatin      sodium chloride 1 gram tablet      tizanidine 2 mg capsule take 2 capsule by oral route  at bedtime     triamterene 37.5 mg-hydrochlorothiazide 25 mg tablet take 1 tablet by oral route  every day on Mon, Wed, Fri     Vistaril 50 mg capsule take 1 capsule by oral route 4 times every day     Vitamin D3 400 unit tablet        ALLERGIES: Ingredient Reaction Medication Name Comment  MORPHINE Hives/Skin Rash    ACETAMINOPHEN Hives/skin rash Tylox   OXYCODONE HCL Hives/skin rash Tylox   LOSARTAN Anaphylaxis        PHYSICAL EXAM:   Vitals Date Temp F BP Pulse Ht In Wt Lb BMI BSA Pain Score  06/08/2018  155/90 83 64.5 194 32.79  10/10      IMPRESSION:   Recommended proceeding with bilateral IPG replacement - IPG replacement scheduled for 06/24/18.  PLAN:  1. Bilateral IPG replacement scheduled for 06/24/18 2. Follow-up 3 weeks after surgery  Orders: Instruction(s)/Education: Assessment Instruction   Tobacco cessation counseling  R03.0 Hypertension education  [phone] Lifestyle education regarding diet   Completed Orders (this encounter) Order Details Reason Side Interpretation Result Initial Treatment Date Region  Tobacco cessation counseling         Hypertension education Patient taking medication as prescribed        Lifestyle education regarding diet Patient encouraged to eat a well  balance diet         Assessment/Plan   # Detail Type Description   1. Assessment Status  post deep brain stimulator placement (Z96.89).       2. Assessment End of battery life of deep brain stimulator (Z45.42).       3. Assessment Essential tremor (G25.0).       4. Assessment Elevated blood-pressure reading, w/o diagnosis of htn (R03.0).       5. Assessment Body mass index (BMI) 32.0-32.9, adult (D28.97).   Plan Orders Today's instructions / counseling include(s) Lifestyle education regarding diet. Clinical information/comments: Patient encouraged to eat a well balance diet.         Pain Management Plan Pain Scale: 10/10. Method: Numeric Pain Intensity Scale. Location: Tremors. Onset: 10/17/2013. Duration: varies. Quality: dull. Pain management follow-up plan of care: Patient taking medication as prescribed.              Provider:  Marchia Meiers. Vertell Limber MD  06/08/2018 06:29 PM Dictation edited by: Mirian Mo    CC Providers: PCP  None   Alonza Bogus  [address] [place], [place] [address]

## 2018-06-11 NOTE — H&P (Signed)
Patient ID:   [id number] Patient: [name]  Date of Birth: 1950-07-27 Visit Type: Office Visit   Date: 06/08/2018 12:30 PM Provider: Marchia Meiers. Vertell Limber MD   This 68 year old female presents for Essential tremor.  HISTORY OF PRESENT ILLNESS:  1.  Essential tremor  Patient returns to discuss replacement of deep brain stimulator implanted pulse generator.  Interrogation by [name] at on the 20th revealed 2.57 on the right and 2.77 on the left. Patient endorses that her DBS has significantly reduced her tremor.   12/05/2013 bilateral DBS for essential tremor         Medical/Surgical/Interim History Reviewed, no change.  Last detailed document date:09/18/2013.     Family History:  Reviewed, no changes.  Last detailed document date:09/18/2013.   Social History: Reviewed, no changes. Last detailed document date: 09/18/2013.    MEDICATIONS: (added, continued or stopped this visit) Started Medication Directions Instruction Stopped   albuterol sulfate 2.5 mg/3 mL (0.083 %) solution for nebulization inhale 3 milliliters by nebulization route  every 6 hours as needed for wheezing or shortness of breath     aspirin 81 mg tablet,delayed release take 1 tablet by oral route  every day     baclofen 10 mg tablet take 1 tablet by oral route at bedtime     Banophen 25 mg capsule take 1 capsule by oral route  every 4 - 6 hours as needed     Biofreeze (menthol) 4 % topical gel      Breo Ellipta 100 mcg-25 mcg/dose powder for inhalation inhale 1 puff by inhalation route  every day at the same time each day     Carafate 1 gram tablet take 1 tablet by oral route 4 times every day on an empty stomach 1 hour before meals and at bedtime     divalproex ER 500 mg tablet,extended release 24 hr take 4 tablet by oral route  every day     docusate sodium 100 mg capsule take 1 capsule by oral route  every day at bedtime as needed     Farxiga 5 mg tablet take 1 tablet by oral route  every day in the  morning     Fish Oil 1,000 mg (120 mg-180 mg) capsule      gabapentin 300 mg capsule take 1 capsule by oral route 3 times every day    03/02/2018 hydrocodone 5 mg-acetaminophen 325 mg tablet take 1 tablet by oral route  every 8 hours as needed for pain (DNF 03/19/18)    03/02/2018 hydrocodone 5 mg-acetaminophen 325 mg tablet take 1 tablet by oral route  every 8 hours as needed for pain (DNF 04/18/18)    05/05/2018 hydrocodone 7.5 mg-acetaminophen 325 mg tablet take 1 tablet by oral route  every 8 hours as needed for pain (DNF until 05/18/2018)     Januvia 100 mg tablet take 1 tablet by oral route  every day     Lamictal 100 mg tablet take 1 tablet by oral route  every day     levothyroxine 75 mcg tablet take 1 tablet by oral route  every day     loperamide 2 mg tablet      loratadine 10 mg tablet take 1 tablet by oral route  every day     lorazepam 1 mg tablet take 1 tablet by oral route 3 times every day as needed     metoprolol succinate ER 50 mg tablet,extended release 24 hr take 1 tablet by  oral route 2 times every day     Milk of Magnesia take 30 milliliter by oral route  every day as needed, followed by a full glass (8 oz) of liquid     Mintox take 10 milliliter by oral route between meals and at bedtime as needed     nitroglycerin 0.4 mg sublingual tablet place 1 tablet by sublingual route at 1st sign of attack; may repeat every 5 minutes up to 3 tabs; if norelief seek medical help     Novolog Flexpen 100 unit/mL subcutaneous inject by subcutaneous route per prescriber's instructions. Insulin dosing requires individualization.     omeprazole 20 mg capsule,delayed release take 1 capsule by oral route  every day 30 minutes to 1 hour before a meal     oxybutynin chloride ER 10 mg tablet,extended release 24 hr take 1 tablet by oral route  every day     pantoprazole 40 mg tablet,delayed release take 1 tablet by oral route  every day     polyethylene glycol 3350 17 gram/dose oral powder take (17G)   by oral route  every day mixed with 8 oz. water, juice, soda, coffee or tea     potassium chloride ER 20 mEq tablet,extended release take 1 tablet by oral route 2 times every day with food     primidone 50 mg tablet take 3 tablet by oral route 2 times every day     ProAir HFA 90 mcg/actuation aerosol inhaler inhale 2 puff by inhalation route  every 6 hours as needed     quetiapine 100 mg tablet take 1 tablet by oral route  every day     ranitidine 150 mg tablet take 1 tablet by oral route  every day     Robafen take 10 milliliter by oral route  every 4 hours as needed     simvastatin      sodium chloride 1 gram tablet      tizanidine 2 mg capsule take 2 capsule by oral route  at bedtime     triamterene 37.5 mg-hydrochlorothiazide 25 mg tablet take 1 tablet by oral route  every day on Mon, Wed, Fri     Vistaril 50 mg capsule take 1 capsule by oral route 4 times every day     Vitamin D3 400 unit tablet        ALLERGIES: Ingredient Reaction Medication Name Comment  MORPHINE Hives/Skin Rash    ACETAMINOPHEN Hives/skin rash Tylox   OXYCODONE HCL Hives/skin rash Tylox   LOSARTAN Anaphylaxis        PHYSICAL EXAM:   Vitals Date Temp F BP Pulse Ht In Wt Lb BMI BSA Pain Score  06/08/2018  155/90 83 64.5 194 32.79  10/10      IMPRESSION:   Recommended proceeding with bilateral IPG replacement - IPG replacement scheduled for 06/24/18.  PLAN:  1. Bilateral IPG replacement scheduled for 06/24/18 2. Follow-up 3 weeks after surgery  Orders: Instruction(s)/Education: Assessment Instruction   Tobacco cessation counseling  R03.0 Hypertension education  [phone] Lifestyle education regarding diet   Completed Orders (this encounter) Order Details Reason Side Interpretation Result Initial Treatment Date Region  Tobacco cessation counseling         Hypertension education Patient taking medication as prescribed        Lifestyle education regarding diet Patient encouraged to eat a well  balance diet         Assessment/Plan   # Detail Type Description   1. Assessment Status  post deep brain stimulator placement (Z96.89).       2. Assessment End of battery life of deep brain stimulator (Z45.42).       3. Assessment Essential tremor (G25.0).       4. Assessment Elevated blood-pressure reading, w/o diagnosis of htn (R03.0).       5. Assessment Body mass index (BMI) 32.0-32.9, adult (M75.44).   Plan Orders Today's instructions / counseling include(s) Lifestyle education regarding diet. Clinical information/comments: Patient encouraged to eat a well balance diet.         Pain Management Plan Pain Scale: 10/10. Method: Numeric Pain Intensity Scale. Location: Tremors. Onset: 10/17/2013. Duration: varies. Quality: dull. Pain management follow-up plan of care: Patient taking medication as prescribed.              Provider:  Marchia Meiers. Vertell Lim[place] MD  06/08/2018 06:29 PM Dictation edited by: Mirian Mo    CC Providers: PCP  None   Alonza Bogus  [address] [place], [place] [address]

## 2018-06-16 ENCOUNTER — Ambulatory Visit (INDEPENDENT_AMBULATORY_CARE_PROVIDER_SITE_OTHER): Payer: Medicare Other | Admitting: Nurse Practitioner

## 2018-06-16 ENCOUNTER — Encounter: Payer: Self-pay | Admitting: Nurse Practitioner

## 2018-06-16 ENCOUNTER — Other Ambulatory Visit: Payer: Self-pay

## 2018-06-16 VITALS — BP 157/95 | HR 78 | Temp 98.4°F | Ht 64.0 in | Wt 194.0 lb

## 2018-06-16 DIAGNOSIS — K219 Gastro-esophageal reflux disease without esophagitis: Secondary | ICD-10-CM

## 2018-06-16 DIAGNOSIS — K222 Esophageal obstruction: Secondary | ICD-10-CM

## 2018-06-16 DIAGNOSIS — K5903 Drug induced constipation: Secondary | ICD-10-CM

## 2018-06-16 NOTE — Assessment & Plan Note (Signed)
GERD symptoms doing well no she eats trigger foods.  Recommend she avoid triggers.  Continue PPI.  Continue other medications and follow-up in 6 months.

## 2018-06-16 NOTE — Assessment & Plan Note (Signed)
Constipation responds well to lactulose and milk of magnesia.  The only time she cannot take these as if she has doctor's appointments.  However, she is able to take it when she gets home.  Recommend she continue her current medications as they seem to work well for her.  Follow-up in 6 months.

## 2018-06-16 NOTE — Assessment & Plan Note (Signed)
Chronic dysphasia issues with a recent EGD within the past year.  Does not have issues if she keeps plenty of fluids on hand.  Recommend she keep adequate fluids with meals to help prevent dysphasia symptoms.  Follow-up in 6 months.

## 2018-06-16 NOTE — Progress Notes (Signed)
cc'ed to pcp °

## 2018-06-16 NOTE — Progress Notes (Signed)
Referring Provider: Billie Ruddy, MD Primary Care Physician:  Abran Richard, MD Primary GI:  Dr. Oneida Alar  Chief Complaint  Patient presents with   Gastroesophageal Reflux    better, watches what she eats   Dysphagia    occ    HPI:   [name] is a 68 y.o. female who presents for follow-up on GERD and dysphasia.  The patient was last seen in our office 12/16/2017 for GERD, constipation, esophageal stenosis, hemorrhoids.  Chronic history of GERD, constipation, esophageal stenosis.  Constipation drug-induced due to narcotics.  Previous EGD 05/06/2017 which found normal esophagus status post dilation, small hiatal hernia.  GERD previously responded to Protonix.  At her last visit GERD symptoms as often as 3 times a week depending on "who cooks and what they put in the food especially hot stuff."  No problems if no spicy foods.  Uses lactulose for constipation versus milk of magnesia.  Still on Amitiza.  Eats a lot of fiber as well.  Noted intermittent spasms and dysphasia.  Typically has dysphasia with potato chips.  No issues with meats or breads.  Recommended continue current medications, break potato chips in the very small pieces if she is going to eat those and drink lots of fluids with them, Anusol as needed for hemorrhoid symptoms, avoid spicy foods, follow-up in 6 months.  Today she states she's doing ok overall. GERD doing well on PPI. Constipation is being a problem, taking Lactulose and MoM; doesn't take if she has a doctors appointment to avoid an accident and she has had several appointments lately. Tomorrow she has 2 appointments back-to-back in [place]. When she does use lactulose it works well. Eats a lot of fiber. Swallowing issues still a problem but this has been helped if she drinks adequate fluids; if drinks adequate fluids with foods dysphagia is not an issue. Feels esophageal spasm a few times which lasts only a few seconds. Denies abdominal pain, N/V,  hematochezia, melena. Denies chest pain, dyspnea, dizziness, lightheadedness, syncope, near syncope. Denies any other upper or lower GI symptoms.  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 [name], residual palpitations   Bipolar 1 disorder ([place])    Chronic back pain    reason unknown   Constipation    COPD (chronic obstructive pulmonary disease) ([place])    DDD (degenerative disc disease), lumbar    Depression    takes Zoloft daily as well as Cymbalta   Diabetes mellitus    takes NOvolin daily   Dysphagia    Dysrhythmia    SVT ==ablation done by Dr. Rayann Heman 2007   Esophageal reflux    takes Zantac daily   Frequent headaches    History of bronchitis Dec 2014   Hypertension    takes Losartan daily   Hypothyroidism    takes Synthroid daily   Joint pain    Joint swelling    left ankle   Low sodium syndrome    Lumbar radiculopathy    Seizures ([place]) 04-05-13   one time ran out of Primidone and had stopped it  cold [place]   SVT (supraventricular tachycardia) ([name])    Tremor, essential    takes Mysoline and Neurontin daily.   Vocal cord dysfunction    Weakness    in left arm    Past Surgical History:  Procedure Laterality Date   ABDOMINAL HYSTERECTOMY     ABLATION FOR AVNRT     APPENDECTOMY     BARTHOLIN GLAND CYST EXCISION     x 2   BIOPSY  03/04/2015   Procedure: BIOPSY (Duodenal, Gastric);  Surgeon: Daneil Dolin, MD;  Location: [facility];  Service: Endoscopy;;   BREAST SURGERY     CESAREAN SECTION     CHOLECYSTECTOMY     COLONOSCOPY  01/16/2004   HFW:YOVZCH rectum/colon   COLONOSCOPY WITH  PROPOFOL N/A 03/04/2015   Dr.[name]- internal hemorrhoids, pancolonic diverticulosis, colonic polyp= tubular adenoma   DEEP BRAIN STIMULATOR PLACEMENT     DENTAL SURGERY     ESOPHAGEAL DILATION N/A 03/04/2015   Procedure: ESOPHAGEAL DILATION WITH 54FR MALONEY DILATOR;  Surgeon: Daneil Dolin, MD;  Location: [facility];  Service: Endoscopy;  Laterality: N/A;   ESOPHAGOGASTRODUODENOSCOPY (EGD) WITH ESOPHAGEAL DILATION  04/03/2002   YIF:OYDXAJOI'N ring, otherwise normal esophagus, status post dilation with 56 F/Normal stomach   ESOPHAGOGASTRODUODENOSCOPY (EGD) WITH ESOPHAGEAL DILATION N/A 11/08/2012   Dr. [name]:schatzki's ring s/p dilation/hiatal hernia   ESOPHAGOGASTRODUODENOSCOPY (EGD) WITH PROPOFOL N/A 03/04/2015   Dr.[name]- schatzki's ring, hiatal hernia, scattered erosions bx= chronic inactive gastritis   ESOPHAGOGASTRODUODENOSCOPY (EGD) WITH PROPOFOL N/A 05/06/2017   Procedure: ESOPHAGOGASTRODUODENOSCOPY (EGD) WITH PROPOFOL;  Surgeon: Daneil Dolin, MD;  [facility];  Service: Endoscopy;  Laterality: N/A;  10:15am   FINGER SURGERY     HEMORRHOID BANDING  2017   Dr.[name]   LEFT ANKLE LIGAMENT REPAIR     x 2   LEFT ELBOW REPAIR     MALONEY DILATION N/A 05/06/2017   Procedure: MALONEY DILATION;  Surgeon: Daneil Dolin, MD;  [facility];  Service: Endoscopy;  Laterality: N/A;   MAXIMUM ACCESS (MAS)POSTERIOR LUMBAR INTERBODY FUSION (PLIF) 1 LEVEL N/A 04/24/2014   Procedure: Lumbar four-five Maximum access Surgery posterior lumbar interbody fusion;  Surgeon: [name], MD;  Location: [facility] [facility];  Service: Neurosurgery;  Laterality: N/A;  Lumbar four-five Maximum access Surgery posterior lumbar interbody fusion   MULTIPLE EXTRACTIONS WITH ALVEOLOPLASTY N/A 06/18/2017   Procedure: MULTIPLE EXTRACTION;  Surgeon: Diona Browner, DDS;  Location: [place];  Service: Oral Surgery;  Laterality: N/A;   PARTIAL HYSTERECTOMY     POLYPECTOMY  03/04/2015   Procedure:  POLYPECTOMY (descending colon);  Surgeon: Daneil Dolin, MD;  Location: [facility];  Service: Endoscopy;;   PULSE GENERATOR IMPLANT Bilateral 12/15/2013   Procedure: BILATERAL PULSE GENERATOR IMPLANT;  Surgeon: [name], MD;  Location: [facility] [facility];  Service: Neurosurgery;  Laterality: Bilateral;  BILATERAL   RIGHT BREAST CYST     benign   SUBTHALAMIC STIMULATOR INSERTION Bilateral 12/04/2013   Procedure: Bilateral Deep brain stimulator placement;  Surgeon: [name], MD;  Location: [place] [facility];  Service: Neurosurgery;  Laterality: Bilateral;  Bilateral Deep brain stimulator placement   TONSILLECTOMY      Current Outpatient Medications  Medication Sig Dispense Refill   albuterol (PROAIR HFA) 108 (90 Base) MCG/ACT inhaler Inhale 2 puffs every 6 hours for asthma - rescue inhaler (Patient taking differently: Inhale 2 puffs into the lungs every 6 (six) hours as needed. ) 1 Inhaler 12   albuterol (PROVENTIL) (2.5 MG/3ML) 0.083%  nebulizer solution Take 2.5 mg by nebulization every 6 (six) hours as needed for wheezing or shortness of breath.     alum & mag hydroxide-simeth (MAALOX/MYLANTA) 200-200-20 MG/5ML suspension Take by mouth every 6 (six) hours as needed for indigestion or heartburn.     AMBULATORY NON FORMULARY MEDICATION [place], Spoon, and Knife  DX: G25.0 1 Device 0   aspirin 81 MG chewable tablet Chew 81 mg by mouth daily. Not taking until after Monday 07/12/17 due to surgery on her trigger finger     baclofen (LIORESAL) 10 MG tablet Take 10 mg by mouth at bedtime.     Carboxymethylcellulose Sod PF (REFRESH PLUS) 0.5 % SOLN Apply 1 drop to eye 2 (two) times daily.     divalproex (DEPAKOTE) 500 MG DR tablet Take 500 mg by mouth 3 (three) times daily.      docusate sodium (COLACE) 100 MG capsule Take 1 capsule (100 mg total) by mouth daily. 30 capsule 0   fluticasone furoate-vilanterol (BREO ELLIPTA) 100-25 MCG/INH AEPB Inhale 1 puff into the lungs daily. 1 each 11    gabapentin (NEURONTIN) 300 MG capsule Take 300 mg by mouth daily. Take 300 mg in the morning and 600 mg at bedtime      GENERLAC 10 GM/15ML SOLN      Glucose-Vitamin C-Vitamin D (TRUEPLUS GLUCOSE) CHEW Chew by mouth as needed.     guaifenesin (ROBITUSSIN) 100 MG/5ML syrup Take 200 mg by mouth every 6 (six) hours as needed for cough.     HYDROcodone-acetaminophen (NORCO) 7.5-325 MG tablet Take 1 tablet by mouth every 8 (eight) hours as needed for moderate pain.     HYDROcodone-acetaminophen (NORCO/VICODIN) 5-325 MG tablet Take 1 tablet by mouth 3 (three) times daily.     hydrocortisone cream 1 % Apply 1 application topically as needed.      ibuprofen (ADVIL,MOTRIN) 200 MG tablet Take 200 mg by mouth every 6 (six) hours as needed.     insulin glargine (LANTUS) 100 UNIT/ML injection Inject 20 Units into the skin at bedtime.      ipratropium (ATROVENT) 0.03 % nasal spray 1-2 puffs each nostril 3 times daily, before meals (Patient taking differently: Place 1-2 sprays into both nostrils 3 (three) times daily. ) 30 mL 11   lactulose (CHRONULAC) 10 GM/15ML solution Take 10 g by mouth 3 (three) times daily.      levothyroxine (SYNTHROID, LEVOTHROID) 75 MCG tablet TAKE (1) TABLET BY MOUTH ONCE DAILY BEFORE BREAKFAST. 30 tablet 5   loperamide (IMODIUM A-D) 2 MG tablet Take 2 mg by mouth 4 (four) times daily as needed for diarrhea or loose stools.      magnesium hydroxide (MILK OF MAGNESIA) 400 MG/5ML suspension Take 30 mLs by mouth at bedtime as needed for mild constipation.      Menthol, Topical Analgesic, (BIOFREEZE) 4 % GEL Apply 1 application topically 2 (two) times daily. Apply to lower back and base of right thumb twice daily.     metoprolol succinate (TOPROL-XL) 50 MG 24 hr tablet Take 50 mg by mouth daily. Take with or immediately following a meal.     Multiple Vitamins-Minerals (MULTIVITAMINS THER. W/MINERALS) TABS tablet TAKE ONE TABLET BY MOUTH ONCE DAILY. 30 each 11   NOVOLOG  FLEXPEN 100 UNIT/ML FlexPen Inject 8 Units into the skin 3 (three) times daily before meals.      Omega 3 1000 MG CAPS TAKE (1) CAPSULE BY MOUTH ONCE DAILY. 30 capsule 11   pantoprazole (PROTONIX) 40  MG tablet Take 40 mg by mouth daily.     polyethylene glycol (MIRALAX / GLYCOLAX) packet Take 17 g by mouth daily as needed for moderate constipation. 14 each 0   potassium chloride SA (K-DUR,KLOR-CON) 20 MEQ tablet Take 20 mEq by mouth daily.     primidone (MYSOLINE) 50 MG tablet Take 50 mg by mouth at bedtime.     QUEtiapine (SEROQUEL) 100 MG tablet Take 100 mg by mouth at bedtime.     simvastatin (ZOCOR) 5 MG tablet Take 1 tablet by mouth daily.     sitaGLIPtin (JANUVIA) 100 MG tablet Take 100 mg by mouth daily.     sodium chloride (OCEAN) 0.65 % SOLN nasal spray Take 1-2 sprays in nostril every hour as needed for rhinitis 30 mL PRN   sodium chloride 1 g tablet Take 1 g by mouth 2 (two) times daily.     sucralfate (CARAFATE) 1 g tablet Take 1 g by mouth 2 (two) times daily.      acetaminophen (TYLENOL) 325 MG tablet Take 650 mg by mouth 3 (three) times daily.     divalproex (DEPAKOTE) 250 MG DR tablet Take 250 mg by mouth 3 (three) times daily.      Respiratory Therapy Supplies (FLUTTER) DEVI Use flutter device 3 times a day (Patient not taking: Reported on 06/16/2018) 1 each 0   No current facility-administered medications for this visit.     Allergies as of 06/16/2018 - Review Complete 06/16/2018  Allergen Reaction Noted   Invokana [canagliflozin] Other (See Comments) 12/05/2014   Losartan Other (See Comments) 01/22/2014   Metformin and related Diarrhea 01/18/2013   Latex Other (See Comments) 07/02/2014   Other Other (See Comments) 07/02/2014   Oxycodone-acetaminophen Itching and Other (See Comments) 05/11/2011   Mucinex [guaifenesin er] Other (See Comments)     Family History  Problem Relation Age of Onset   Lung cancer Father        DIED AGE 31 LUNG CA    Alcohol abuse Father    Anxiety disorder Father    Depression Father    COPD Mother        DIED AGE 62,MRSA,COPD,PNEUMONIA   Pneumonia Mother        DIED AGE 62,MRSA,COPD,PNEUMONIA   Supraventricular tachycardia Mother    Anxiety disorder Mother    Depression Mother    Alcohol abuse Mother    Heart disease Sister        DIED AGE 36, SMOKER,?HEART   COPD Brother        AGE 62   Colon cancer Neg Hx     Social History   Socioeconomic History   Marital status: Legally Separated    Spouse name: Not on file   Number of children: Not on file   Years of education: Not on file   Highest education level: Bachelor's degree (e.g., BA, AB, BS)  Occupational History   Occupation: retired  Scientist, product/process development strain: Not on file   Food insecurity:    Worry: Not on file    Inability: Not on file   Transportation needs:    Medical: Not on file    Non-medical: Not on file  Tobacco Use   Smoking status: Former Smoker    Packs/day: 0.30    Years: 10.00    Pack years: 3.00    Types: Cigarettes    Last attempt to quit: 04/23/1993    Years since quitting: 25.1   Smokeless tobacco: Never Used  Tobacco comment: quit smoking 25+yrs ago  Substance and Sexual Activity   Alcohol use: No    Alcohol/week: 0.0 standard drinks   Drug use: No   Sexual activity: Not Currently  Lifestyle   Physical activity:    Days per week: Not on file    Minutes per session: Not on file   Stress: Not on file  Relationships   Social connections:    Talks on phone: Not on file    Gets together: Not on file    Attends religious service: Not on file    Active member of club or organization: Not on file    Attends meetings of clubs or organizations: Not on file    Relationship status: Not on file  Other Topics Concern   Not on file  Social History Narrative   Married, 1 daughte, living.  Lives with husband in [place], [place].   1 child died brain tumor at age 8.    Two grandchildren.   Works at [employer], patient currently lost her job.   Retired 2011.   No tobacco.   Alcohol: none in 30 yrs.  Distant history of heavy use.   No drug use.    Review of Systems: Complete ROS negative except as per HPI.   Physical Exam: BP (!) 157/95    Pulse 78    Temp 98.4 F (36.9 C) (Oral)    Ht 5\' 4"  (1.626 m)    Wt 194 lb (88 kg) Comment: stated by patient   BMI 33.30 kg/m  General:   Alert and oriented. Pleasant and cooperative. Well-nourished and well-developed.  Eyes:  Without icterus, sclera clear and conjunctiva pink.  Ears:  Normal auditory acuity. Cardiovascular:  S1, S2 present without murmurs appreciated. Extremities without clubbing or edema. Respiratory:  Clear to auscultation bilaterally. No wheezes, rales, or rhonchi. No distress.  Gastrointestinal:  +BS, soft, non-tender and non-distended. No HSM noted. No guarding or rebound. No masses appreciated.  Rectal:  Deferred  Musculoskalatal:  Symmetrical without gross deformities. Neurologic:  Alert and oriented x4;  grossly normal neurologically. Psych:  Alert and cooperative. Normal mood and affect. Heme/Lymph/Immune: No excessive bruising noted.    06/16/2018 11:42 AM   Disclaimer: This note was dictated with voice recognition software. Similar sounding words can inadvertently be transcribed and may not be corrected upon review.

## 2018-06-16 NOTE — Patient Instructions (Addendum)
Your health issues we discussed today were:   Swallowing issues and heartburn: 1. Avoid specific foods that make these worse 2. Continue your current medications 3. Keep plenty of fluids with you while eating to help prevent swallowing problems  Constipation: 1. Continue taking lactulose and milk of magnesia 2. If you have a doctor's appointment, you can take these when you return after the appointment  Overall I recommend:  1. Follow-up in 6 months 2. Call us if you have any questions or concerns.    Because of recent events of COVID-19 ("Coronavirus"), follow CDC recommendations:  1. Wash your hand frequently 2. Avoid touching your face 3. Stay away from people who are sick 4. If you have symptoms such as fever, cough, shortness of breath then call your healthcare provider for further guidance 5. If you are sick, STAY AT HOME unless otherwise directed by your healthcare provider.    At [facility] we value your feedback. You may receive a survey about your visit today. Please share your experience as we strive to create trusting relationships with our patients to provide genuine, compassionate, quality care.  We appreciate your understanding and patience as we review any laboratory studies, imaging, and other diagnostic tests that are ordered as we care for you. Our office policy is 5 business days for review of these results, and any emergent or urgent results are addressed in a timely manner for your best interest. If you do not hear from our office in 1 week, please contact us.   We also encourage the use of MyChart, which contains your medical information for your review as well. If you are not enrolled in this feature, an access code is on this after visit summary for your convenience. Thank you for allowing [place] to be involved in your care.  It was great to see you today!  I hope you have a great day!!

## 2018-06-17 ENCOUNTER — Encounter: Payer: Self-pay | Admitting: Podiatry

## 2018-06-17 ENCOUNTER — Other Ambulatory Visit: Payer: Self-pay

## 2018-06-17 ENCOUNTER — Ambulatory Visit (INDEPENDENT_AMBULATORY_CARE_PROVIDER_SITE_OTHER): Payer: Medicare Other | Admitting: Podiatry

## 2018-06-17 DIAGNOSIS — B351 Tinea unguium: Secondary | ICD-10-CM | POA: Diagnosis not present

## 2018-06-17 DIAGNOSIS — M79674 Pain in right toe(s): Secondary | ICD-10-CM | POA: Diagnosis not present

## 2018-06-17 DIAGNOSIS — M79675 Pain in left toe(s): Secondary | ICD-10-CM

## 2018-06-17 DIAGNOSIS — E11622 Type 2 diabetes mellitus with other skin ulcer: Secondary | ICD-10-CM | POA: Diagnosis not present

## 2018-06-17 DIAGNOSIS — E1142 Type 2 diabetes mellitus with diabetic polyneuropathy: Secondary | ICD-10-CM

## 2018-06-17 NOTE — Patient Instructions (Signed)
Diabetes Mellitus and Foot Care Foot care is an important part of your health, especially when you have diabetes. Diabetes may cause you to have problems because of poor blood flow (circulation) to your feet and legs, which can cause your skin to:  Become thinner and drier.  Break more easily.  Heal more slowly.  Peel and crack. You may also have nerve damage (neuropathy) in your legs and feet, causing decreased feeling in them. This means that you may not notice minor injuries to your feet that could lead to more serious problems. Noticing and addressing any potential problems early is the best way to prevent future foot problems. How to care for your feet Foot hygiene  Wash your feet daily with warm water and mild soap. Do not use hot water. Then, pat your feet and the areas between your toes until they are completely dry. Do not soak your feet as this can dry your skin.  Trim your toenails straight across. Do not dig under them or around the cuticle. File the edges of your nails with an emery board or nail file.  Apply a moisturizing lotion or petroleum jelly to the skin on your feet and to dry, brittle toenails. Use lotion that does not contain alcohol and is unscented. Do not apply lotion between your toes. Shoes and socks  Wear clean socks or stockings every day. Make sure they are not too tight. Do not wear knee-high stockings since they may decrease blood flow to your legs.  Wear shoes that fit properly and have enough cushioning. Always look in your shoes before you put them on to be sure there are no objects inside.  To break in new shoes, wear them for just a few hours a day. This prevents injuries on your feet. Wounds, scrapes, corns, and calluses  Check your feet daily for blisters, cuts, bruises, sores, and redness. If you cannot see the bottom of your feet, use a mirror or ask someone for help.  Do not cut corns or calluses or try to remove them with medicine.  If you  find a minor scrape, cut, or break in the skin on your feet, keep it and the skin around it clean and dry. You may clean these areas with mild soap and water. Do not clean the area with peroxide, alcohol, or iodine.  If you have a wound, scrape, corn, or callus on your foot, look at it several times a day to make sure it is healing and not infected. Check for: ? Redness, swelling, or pain. ? Fluid or blood. ? Warmth. ? Pus or a bad smell. General instructions  Do not cross your legs. This may decrease blood flow to your feet.  Do not use heating pads or hot water bottles on your feet. They may burn your skin. If you have lost feeling in your feet or legs, you may not know this is happening until it is too late.  Protect your feet from hot and cold by wearing shoes, such as at the beach or on hot pavement.  Schedule a complete foot exam at least once a year (annually) or more often if you have foot problems. If you have foot problems, report any cuts, sores, or bruises to your health care provider immediately. Contact a health care provider if:  You have a medical condition that increases your risk of infection and you have any cuts, sores, or bruises on your feet.  You have an injury that is not   healing.  You have redness on your legs or feet.  You feel burning or tingling in your legs or feet.  You have pain or cramps in your legs and feet.  Your legs or feet are numb.  Your feet always feel cold.  You have pain around a toenail. Get help right away if:  You have a wound, scrape, corn, or callus on your foot and: ? You have pain, swelling, or redness that gets worse. ? You have fluid or blood coming from the wound, scrape, corn, or callus. ? Your wound, scrape, corn, or callus feels warm to the touch. ? You have pus or a bad smell coming from the wound, scrape, corn, or callus. ? You have a fever. ? You have a red line going up your leg. Summary  Check your feet every day  for cuts, sores, red spots, swelling, and blisters.  Moisturize feet and legs daily.  Wear shoes that fit properly and have enough cushioning.  If you have foot problems, report any cuts, sores, or bruises to your health care provider immediately.  Schedule a complete foot exam at least once a year (annually) or more often if you have foot problems. This information is not intended to replace advice given to you by your health care provider. Make sure you discuss any questions you have with your health care provider. Document Released: 03/20/2000 Document Revised: 05/05/2017 Document Reviewed: 04/24/2016 Elsevier Interactive Patient Education  2019 [employer]  Onychomycosis/Fungal Toenails  WHAT IS IT? An infection that lies within the keratin of your nail plate that is caused by a fungus.  WHY ME? Fungal infections affect all ages, sexes, races, and creeds.  There may be many factors that predispose you to a fungal infection such as age, coexisting medical conditions such as diabetes, or an autoimmune disease; stress, medications, fatigue, genetics, etc.  Bottom line: fungus thrives in a warm, moist environment and your shoes offer such a location.  IS IT CONTAGIOUS? Theoretically, yes.  You do not want to share shoes, nail clippers or files with someone who has fungal toenails.  Walking around barefoot in the same room or sleeping in the same bed is unlikely to transfer the organism.  It is important to realize, however, that fungus can spread easily from one nail to the next on the same foot.  HOW DO WE TREAT THIS?  There are several ways to treat this condition.  Treatment may depend on many factors such as age, medications, pregnancy, liver and kidney conditions, etc.  It is best to ask your doctor which options are available to you.  1. No treatment.   Unlike many other medical concerns, you can live with this condition.  However for many people this can be a painful condition and  may lead to ingrown toenails or a bacterial infection.  It is recommended that you keep the nails cut short to help reduce the amount of fungal nail. 2. Topical treatment.  These range from herbal remedies to prescription strength nail lacquers.  About 40-50% effective, topicals require twice daily application for approximately 9 to 12 months or until an entirely new nail has grown out.  The most effective topicals are medical grade medications available through physicians offices. 3. Oral antifungal medications.  With an 80-90% cure rate, the most common oral medication requires 3 to 4 months of therapy and stays in your system for a year as the new nail grows out.  Oral antifungal medications do require   blood work to make sure it is a safe drug for you.  A liver function panel will be performed prior to starting the medication and after the first month of treatment.  It is important to have the blood work performed to avoid any harmful side effects.  In general, this medication safe but blood work is required. 4. Laser Therapy.  This treatment is performed by applying a specialized laser to the affected nail plate.  This therapy is noninvasive, fast, and non-painful.  It is not covered by insurance and is therefore, out of pocket.  The results have been very good with a 80-95% cure rate.  [facility] is the only practice in the area to offer this therapy. 5. Permanent Nail Avulsion.  Removing the entire nail so that a new nail will not grow back. 

## 2018-06-19 ENCOUNTER — Encounter (HOSPITAL_COMMUNITY): Payer: Self-pay | Admitting: Vascular Surgery

## 2018-06-23 ENCOUNTER — Other Ambulatory Visit: Payer: Self-pay | Admitting: Neurosurgery

## 2018-06-23 NOTE — Anesthesia Preprocedure Evaluation (Addendum)
Anesthesia Evaluation  Patient identified by MRN, date of birth, ID band Patient awake    Reviewed: Allergy & Precautions, NPO status , Patient's Chart, lab work & pertinent test results  Airway Mallampati: I  TM Distance: >3 FB Neck ROM: Full    Dental  (+) Edentulous Upper   Pulmonary asthma , sleep apnea , COPD,  COPD inhaler, former smoker,    breath sounds clear to auscultation       Cardiovascular hypertension, Pt. on home beta blockers + dysrhythmias  Rhythm:Regular Rate:Normal     Neuro/Psych  Headaches, Seizures -,  Anxiety Depression Bipolar Disorder    GI/Hepatic Neg liver ROS, GERD  Medicated,  Endo/Other  diabetes, Type 2, Insulin Dependent, Oral Hypoglycemic AgentsHypothyroidism   Renal/GU negative Renal ROS     Musculoskeletal  (+) Arthritis ,   Abdominal (+) + obese,   Peds  Hematology negative hematology ROS (+)   Anesthesia Other Findings   Reproductive/Obstetrics                         Anesthesia Physical Anesthesia Plan  ASA: III  Anesthesia Plan: General   Post-op Pain Management:    Induction: Intravenous  PONV Risk Score and Plan: 4 or greater and Ondansetron, Dexamethasone and Treatment may vary due to age or medical condition  Airway Management Planned: Oral ETT  Additional Equipment: None  Intra-op Plan:   Post-operative Plan: Extubation in OR  Informed Consent: I have reviewed the patients History and Physical, chart, labs and discussed the procedure including the risks, benefits and alternatives for the proposed anesthesia with the patient or authorized representative who has indicated his/her understanding and acceptance.       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 Per note 06/06/18 she is "symptomatically stable without recurrences". Advised 47yr f/u.   TTE 2013 showed EF  65%, no significant valvular abnormalities. )    Anesthesia Quick Evaluation

## 2018-06-23 NOTE — Progress Notes (Signed)
[name] lives at [facility]. I spoke to a care giver who reports that patient or any resident or the driver has:  Cough Fever ( >100.4) Runny nose Sore Throat Difficulty breathing/shortness of breath Travel in past  2 weeks  Travel to  Been around anyone who has been tested or diagnosed with [facility] Virus  [name] saw cardiologist 06/06/2018/, Dr Kennieth Francois- an EKG was not found. PCP is Dr Yong Channel at [facility], patient was seen there 06/16/2018 and had labs, have requested last office note, labs, EKG.  [name] reports that  Is a pivot with assistance, denies chest pain, is alert and oriented. CBG's have been running in the 130's.  I sent written instruction regarding Insulin, oral agents for diabetes and instructions regarding CBGs in am and treatment of CBG less than 70.

## 2018-06-23 NOTE — Pre-Procedure Instructions (Addendum)
    [name]  06/23/2018     Your procedure is scheduled on Friday,  June 24, 2018.  Report to [facility] Admitting at 8: 30 A.M.   Call this number if you have problems the morning of surgery:  [phone]- pre- surgery desk  [place], take 10_ units of __Lantus_insulin.   Do not take oral diabetes medicines (pills) the morning of surgery. - Hold sitaGLIPtin (JANUVIA)   Remember:  Do not eat or drink after midnight.   Take these medicines the morning of surgery with A SIP OF WATER :  divalproex (DEPAKOTE) fluticasone furoate-vilanterol (BREO ELLIPTA)  gabapentin (NEURONTIN) levothyroxine (SYNTHROID, LEVOTHROID)  pantoprazole (PROTONIX) primidone (MYSOLINE)  May use Eye Drops and Nasal Spray  o Check blood sugar the morning of your surgery when you wake up and every 2 hours until you get to the Short Stay unit. . If your blood sugar is less than 70 mg/dL, you will need to treat for low blood sugar: o Treat a low blood sugar (less than 70 mg/dL) 4 glucose tablets. o  Recheck blood sugar in 15 minutes after treatment (to make sure it is greater than 70 mg/dL). If your blood sugar is not greater than 70 mg/dL on recheck, call [phone] o  for further instructions. . Report your blood sugar to the short stay nurse when you get to Short Stay.  >>>>>Please send patient's Medication Record with medications administrated documentation. ( this information is required prior to OR. This includes medications that may have been on hold for surgery)<<<<<   Do not wear jewelry, make-up or nail polish.  Do not wear lotions, powders, or perfumes, or deodorant.  Do not shave 48 hours prior to surgery.  Men may shave face and neck.  Do not bring valuables to the hospital.  [facility] is not responsible for any belongings or valuables.  Orders are given by [facility]

## 2018-06-24 ENCOUNTER — Other Ambulatory Visit: Payer: Self-pay

## 2018-06-24 ENCOUNTER — Encounter (HOSPITAL_COMMUNITY): Admission: RE | Payer: Self-pay | Source: Home / Self Care

## 2018-06-24 ENCOUNTER — Ambulatory Visit (HOSPITAL_COMMUNITY): Admission: RE | Admit: 2018-06-24 | Payer: Medicare Other | Source: Home / Self Care | Admitting: Neurosurgery

## 2018-06-24 ENCOUNTER — Ambulatory Visit (HOSPITAL_COMMUNITY): Payer: Medicare Other | Admitting: Physician Assistant

## 2018-06-24 ENCOUNTER — Encounter (HOSPITAL_COMMUNITY): Payer: Self-pay

## 2018-06-24 ENCOUNTER — Ambulatory Visit (HOSPITAL_COMMUNITY)
Admission: RE | Admit: 2018-06-24 | Discharge: 2018-06-24 | Disposition: A | Discharge status: Skilled Nursing Facility | Payer: Medicare Other | Attending: Neurosurgery | Admitting: Neurosurgery

## 2018-06-24 ENCOUNTER — Encounter (HOSPITAL_COMMUNITY)
Admission: RE | Disposition: A | Discharge status: Skilled Nursing Facility | Payer: Self-pay | Source: Home / Self Care | Attending: Neurosurgery

## 2018-06-24 DIAGNOSIS — Z794 Long term (current) use of insulin: Secondary | ICD-10-CM | POA: Diagnosis not present

## 2018-06-24 DIAGNOSIS — K219 Gastro-esophageal reflux disease without esophagitis: Secondary | ICD-10-CM | POA: Diagnosis not present

## 2018-06-24 DIAGNOSIS — F418 Other specified anxiety disorders: Secondary | ICD-10-CM | POA: Insufficient documentation

## 2018-06-24 DIAGNOSIS — G250 Essential tremor: Secondary | ICD-10-CM | POA: Insufficient documentation

## 2018-06-24 DIAGNOSIS — I10 Essential (primary) hypertension: Secondary | ICD-10-CM | POA: Insufficient documentation

## 2018-06-24 DIAGNOSIS — G4730 Sleep apnea, unspecified: Secondary | ICD-10-CM | POA: Diagnosis not present

## 2018-06-24 DIAGNOSIS — E039 Hypothyroidism, unspecified: Secondary | ICD-10-CM | POA: Diagnosis not present

## 2018-06-24 DIAGNOSIS — Z7982 Long term (current) use of aspirin: Secondary | ICD-10-CM | POA: Insufficient documentation

## 2018-06-24 DIAGNOSIS — Z79899 Other long term (current) drug therapy: Secondary | ICD-10-CM | POA: Diagnosis not present

## 2018-06-24 DIAGNOSIS — J449 Chronic obstructive pulmonary disease, unspecified: Secondary | ICD-10-CM | POA: Diagnosis not present

## 2018-06-24 DIAGNOSIS — Z87891 Personal history of nicotine dependence: Secondary | ICD-10-CM | POA: Insufficient documentation

## 2018-06-24 DIAGNOSIS — F319 Bipolar disorder, unspecified: Secondary | ICD-10-CM | POA: Insufficient documentation

## 2018-06-24 DIAGNOSIS — E119 Type 2 diabetes mellitus without complications: Secondary | ICD-10-CM | POA: Diagnosis not present

## 2018-06-24 DIAGNOSIS — Z79890 Hormone replacement therapy (postmenopausal): Secondary | ICD-10-CM | POA: Insufficient documentation

## 2018-06-24 DIAGNOSIS — Z4542 Encounter for adjustment and management of neuropacemaker (brain) (peripheral nerve) (spinal cord): Secondary | ICD-10-CM | POA: Insufficient documentation

## 2018-06-24 HISTORY — PX: SUBTHALAMIC STIMULATOR BATTERY REPLACEMENT: SHX5405

## 2018-06-24 LAB — TYPE AND SCREEN
ABO/RH(D): O POS
Antibody Screen: NEGATIVE

## 2018-06-24 LAB — BASIC METABOLIC PANEL
Anion gap: 8 (ref 5–15)
BUN: 11 mg/dL (ref 8–23)
CO2: 27 mmol/L (ref 22–32)
Calcium: 9 mg/dL (ref 8.9–10.3)
Chloride: 103 mmol/L (ref 98–111)
Creatinine, Ser: 0.72 mg/dL (ref 0.44–1.00)
GFR calc Af Amer: >60 mL/min (ref >60)
GFR calc non Af Amer: >60 mL/min (ref >60)
Glucose, Bld: 94 mg/dL (ref 70–99)
Potassium: 4 mmol/L (ref 3.5–5.1)
Sodium: 138 mmol/L (ref 135–145)

## 2018-06-24 LAB — GLUCOSE, CAPILLARY
Glucose-Capillary: 86 mg/dL (ref 70–99)
Glucose-Capillary: 93 mg/dL (ref 70–99)
Glucose-Capillary: 84 mg/dL (ref 70–99)

## 2018-06-24 SURGERY — SUBTHALAMIC STIMULATOR BATTERY REPLACEMENT
Anesthesia: General | Site: Chest | Laterality: Bilateral

## 2018-06-24 SURGERY — SUBTHALAMIC STIMULATOR BATTERY REPLACEMENT
Anesthesia: General | Laterality: Bilateral

## 2018-06-24 MED ORDER — DEXAMETHASONE SODIUM PHOSPHATE 10 MG/ML IJ SOLN
INTRAMUSCULAR | Status: DC | PRN
  Administered 2018-06-24: 6 mg via INTRAVENOUS

## 2018-06-24 MED ORDER — LACTATED RINGERS IV SOLN: INTRAVENOUS | Status: DC

## 2018-06-24 MED ORDER — LACTATED RINGERS IV SOLN
INTRAVENOUS | Status: DC
  Administered 2018-06-24 (×2): via INTRAVENOUS

## 2018-06-24 MED ORDER — ACETAMINOPHEN 10 MG/ML IV SOLN: 1000.0000 mg | Freq: Once | INTRAVENOUS | Status: DC | PRN

## 2018-06-24 MED ORDER — FENTANYL CITRATE (PF) 250 MCG/5ML IJ SOLN
INTRAMUSCULAR | Status: AC
  Filled 2018-06-24: qty 5

## 2018-06-24 MED ORDER — LIDOCAINE-EPINEPHRINE 1 %-1:100000 IJ SOLN
INTRAMUSCULAR | Status: AC
  Filled 2018-06-24: qty 1

## 2018-06-24 MED ORDER — VANCOMYCIN HCL 1000 MG IV SOLR
INTRAVENOUS | Status: DC | PRN
  Administered 2018-06-24: 1000 mg via TOPICAL

## 2018-06-24 MED ORDER — PROMETHAZINE HCL 25 MG/ML IJ SOLN: 6.2500 mg | INTRAMUSCULAR | Status: DC | PRN

## 2018-06-24 MED ORDER — MIDAZOLAM HCL 2 MG/2ML IJ SOLN
INTRAMUSCULAR | Status: AC
  Filled 2018-06-24: qty 2

## 2018-06-24 MED ORDER — CEFAZOLIN SODIUM-DEXTROSE 2-4 GM/100ML-% IV SOLN
INTRAVENOUS | Status: AC
  Filled 2018-06-24: qty 100

## 2018-06-24 MED ORDER — BUPIVACAINE HCL (PF) 0.5 % IJ SOLN
INTRAMUSCULAR | Status: AC
  Filled 2018-06-24: qty 30

## 2018-06-24 MED ORDER — LIDOCAINE 2% (20 MG/ML) 5 ML SYRINGE
INTRAMUSCULAR | Status: AC
  Filled 2018-06-24: qty 5

## 2018-06-24 MED ORDER — SUCCINYLCHOLINE CHLORIDE 200 MG/10ML IV SOSY
PREFILLED_SYRINGE | INTRAVENOUS | Status: AC
  Filled 2018-06-24: qty 10

## 2018-06-24 MED ORDER — PROPOFOL 10 MG/ML IV BOLUS
INTRAVENOUS | Status: DC | PRN
  Administered 2018-06-24: 150 mg via INTRAVENOUS

## 2018-06-24 MED ORDER — SUCCINYLCHOLINE CHLORIDE 20 MG/ML IJ SOLN
INTRAMUSCULAR | Status: DC | PRN
  Administered 2018-06-24: 120 mg via INTRAVENOUS

## 2018-06-24 MED ORDER — ACETAMINOPHEN 160 MG/5ML PO SOLN: 325.0000 mg | Freq: Once | ORAL | Status: DC

## 2018-06-24 MED ORDER — VANCOMYCIN HCL 1000 MG IV SOLR
INTRAVENOUS | Status: AC
  Filled 2018-06-24: qty 1000

## 2018-06-24 MED ORDER — FENTANYL CITRATE (PF) 250 MCG/5ML IJ SOLN
INTRAMUSCULAR | Status: DC | PRN
  Administered 2018-06-24: 25 ug via INTRAVENOUS
  Administered 2018-06-24: 50 ug via INTRAVENOUS

## 2018-06-24 MED ORDER — PHENYLEPHRINE HCL 10 MG/ML IJ SOLN
INTRAMUSCULAR | Status: DC | PRN
  Administered 2018-06-24: 40 ug via INTRAVENOUS
  Administered 2018-06-24: 80 ug via INTRAVENOUS
  Administered 2018-06-24 (×3): 40 ug via INTRAVENOUS
  Administered 2018-06-24: 4 ug via INTRAVENOUS

## 2018-06-24 MED ORDER — CHLORHEXIDINE GLUCONATE CLOTH 2 % EX PADS: 6.0000 | MEDICATED_PAD | Freq: Once | CUTANEOUS | Status: DC

## 2018-06-24 MED ORDER — LIDOCAINE-EPINEPHRINE 1 %-1:100000 IJ SOLN
INTRAMUSCULAR | Status: DC | PRN
  Administered 2018-06-24: 5 mL

## 2018-06-24 MED ORDER — BACITRACIN ZINC 500 UNIT/GM EX OINT
TOPICAL_OINTMENT | CUTANEOUS | Status: AC
  Filled 2018-06-24: qty 28.35

## 2018-06-24 MED ORDER — CEFAZOLIN SODIUM-DEXTROSE 2-4 GM/100ML-% IV SOLN
2.0000 g | INTRAVENOUS | Status: AC
  Administered 2018-06-24: 2 g via INTRAVENOUS

## 2018-06-24 MED ORDER — LIDOCAINE HCL (CARDIAC) PF 100 MG/5ML IV SOSY
PREFILLED_SYRINGE | INTRAVENOUS | Status: DC | PRN
  Administered 2018-06-24: 100 mg via INTRATRACHEAL

## 2018-06-24 MED ORDER — MEPERIDINE HCL 50 MG/ML IJ SOLN: 6.2500 mg | INTRAMUSCULAR | Status: DC | PRN

## 2018-06-24 MED ORDER — ROCURONIUM BROMIDE 50 MG/5ML IV SOSY
PREFILLED_SYRINGE | INTRAVENOUS | Status: AC
  Filled 2018-06-24: qty 5

## 2018-06-24 MED ORDER — BUPIVACAINE HCL (PF) 0.5 % IJ SOLN
INTRAMUSCULAR | Status: DC | PRN
  Administered 2018-06-24: 5 mL

## 2018-06-24 MED ORDER — FENTANYL CITRATE (PF) 100 MCG/2ML IJ SOLN: 25.0000 ug | INTRAMUSCULAR | Status: DC | PRN

## 2018-06-24 MED ORDER — ONDANSETRON HCL 4 MG/2ML IJ SOLN
INTRAMUSCULAR | Status: DC | PRN
  Administered 2018-06-24: 4 mg via INTRAVENOUS

## 2018-06-24 MED ORDER — ACETAMINOPHEN 325 MG PO TABS: 325.0000 mg | ORAL_TABLET | Freq: Once | ORAL | Status: DC

## 2018-06-24 MED ORDER — 0.9 % SODIUM CHLORIDE (POUR BTL) OPTIME
TOPICAL | Status: DC | PRN
  Administered 2018-06-24: 1000 mL

## 2018-06-24 SURGICAL SUPPLY — 42 items
CANISTER SUCT 3000ML PPV (MISCELLANEOUS) ×2 IMPLANT
CARTRIDGE OIL MAESTRO DRILL (MISCELLANEOUS) IMPLANT
COVER WAND RF STERILE (DRAPES) IMPLANT
DECANTER SPIKE VIAL GLASS SM (MISCELLANEOUS) ×2 IMPLANT
DERMABOND ADVANCED (GAUZE/BANDAGES/DRESSINGS) ×2
DERMABOND ADVANCED .7 DNX12 (GAUZE/BANDAGES/DRESSINGS) ×2 IMPLANT
DIFFUSER DRILL AIR PNEUMATIC (MISCELLANEOUS) IMPLANT
DRAPE LAPAROTOMY 100X72 PEDS (DRAPES) IMPLANT
DRAPE POUCH INSTRU U-SHP 10X18 (DRAPES) IMPLANT
DRSG OPSITE POSTOP 3X4 (GAUZE/BANDAGES/DRESSINGS) ×4 IMPLANT
DURAPREP 26ML APPLICATOR (WOUND CARE) ×2 IMPLANT
GAUZE 4X4 16PLY RFD (DISPOSABLE) IMPLANT
GLOVE BIO SURGEON STRL SZ 6.5 (GLOVE) ×2 IMPLANT
GLOVE BIO SURGEON STRL SZ8 (GLOVE) ×2 IMPLANT
GLOVE BIOGEL PI IND STRL 6.5 (GLOVE) ×1 IMPLANT
GLOVE BIOGEL PI IND STRL 8 (GLOVE) ×1 IMPLANT
GLOVE BIOGEL PI IND STRL 8.5 (GLOVE) ×1 IMPLANT
GLOVE BIOGEL PI INDICATOR 6.5 (GLOVE) ×1
GLOVE BIOGEL PI INDICATOR 8 (GLOVE) ×1
GLOVE BIOGEL PI INDICATOR 8.5 (GLOVE) ×1
GLOVE ECLIPSE 8.0 STRL XLNG CF (GLOVE) ×2 IMPLANT
GLOVE EXAM NITRILE XL STR (GLOVE) IMPLANT
GOWN STRL REUS W/ TWL LRG LVL3 (GOWN DISPOSABLE) ×1 IMPLANT
GOWN STRL REUS W/ TWL XL LVL3 (GOWN DISPOSABLE) ×1 IMPLANT
GOWN STRL REUS W/TWL 2XL LVL3 (GOWN DISPOSABLE) ×2 IMPLANT
GOWN STRL REUS W/TWL LRG LVL3 (GOWN DISPOSABLE) ×1
GOWN STRL REUS W/TWL XL LVL3 (GOWN DISPOSABLE) ×1
KIT BASIN OR (CUSTOM PROCEDURE TRAY) ×2 IMPLANT
KIT TURNOVER KIT B (KITS) ×2 IMPLANT
NEEDLE HYPO 25X1 1.5 SAFETY (NEEDLE) ×2 IMPLANT
NEUROSTIM OCTOPOLAR ~~LOC~~ 60X55 (Neuro Prosthesis/Implant) ×4 IMPLANT
NS IRRIG 1000ML POUR BTL (IV SOLUTION) ×2 IMPLANT
OIL CARTRIDGE MAESTRO DRILL (MISCELLANEOUS)
PACK LAMINECTOMY NEURO (CUSTOM PROCEDURE TRAY) ×2 IMPLANT
PAD ARMBOARD 7.5X6 YLW CONV (MISCELLANEOUS) ×6 IMPLANT
PROGRAMMER ACTIVA DBS HANDSET (NEUROSIGN) ×2 IMPLANT
Patient Programmer ×2 IMPLANT
SUT VIC AB 2-0 CP2 18 (SUTURE) ×4 IMPLANT
SUT VIC AB 3-0 SH 8-18 (SUTURE) ×2 IMPLANT
TOWEL GREEN STERILE (TOWEL DISPOSABLE) ×2 IMPLANT
TOWEL GREEN STERILE FF (TOWEL DISPOSABLE) ×2 IMPLANT
WATER STERILE IRR 1000ML POUR (IV SOLUTION) ×2 IMPLANT

## 2018-06-24 NOTE — Brief Op Note (Signed)
06/24/2018  12:34 PM  PATIENT:  [name]  68 y.o. female  PRE-OPERATIVE DIAGNOSIS:  End of battery life for deep brain stimulator, bilateral IPGs  POST-OPERATIVE DIAGNOSIS: End of battery life for deep brain stimulator, bilateral IPGs  PROCEDURE:  Procedure(s) with comments: Bilateral implantable pulse generator battery change (Bilateral) - bilateral  SURGEON:  Surgeon(s) and Role:    [name], MD - Primary  PHYSICIAN ASSISTANT:   ASSISTANTS: Poteat, RN   ANESTHESIA:   general  EBL:  10 mL   BLOOD ADMINISTERED:none  DRAINS: none   LOCAL MEDICATIONS USED:  MARCAINE    and LIDOCAINE   SPECIMEN:  No Specimen  DISPOSITION OF SPECIMEN:  N/A  COUNTS:  YES  TOURNIQUET:  * No tourniquets in log *  DICTATION: Patient has implanted VIM thalamic stimulator electrodes and IPGs, which are now depleted.  It was elected for patient to undergo bilateral IPG revision.  PROCEDURE: Patient was brought to the operating room and given general anesthesia.  Bilateral upper chest was prepped with betadine scrub and Duraprep.  Area of planned incision was infiltrated with lidocaine.  We initially exchanged the right IPG and then the left. Prior incisions were reopened and the old IPGs were externalized.  New IPGs were connected and placed in the pockets.  Wounds were irrigated with  vancomycin. Then irrigated once more.  Incisions were closed with 2-0 Vicryl and 3-0 vicryl sutures and dressed with a sterile occlusive dressing.  Counts were correct at the end of the case.   PLAN OF CARE: Discharge to home after PACU  PATIENT DISPOSITION:  PACU - hemodynamically stable.   Delay start of Pharmacological VTE agent (>24hrs) due to surgical blood loss or risk of bleeding: yes

## 2018-06-24 NOTE — Op Note (Signed)
06/24/2018  12:34 PM  PATIENT:  Alice Reichert  68 y.o. female  PRE-OPERATIVE DIAGNOSIS:  End of battery life for deep brain stimulator, bilateral IPGs  POST-OPERATIVE DIAGNOSIS: End of battery life for deep brain stimulator, bilateral IPGs  PROCEDURE:  Procedure(s) with comments: Bilateral implantable pulse generator battery change (Bilateral) - bilateral  SURGEON:  Surgeon(s) and Role:    [name], MD - Primary  PHYSICIAN ASSISTANT:   ASSISTANTS: Poteat, RN   ANESTHESIA:   general  EBL:  10 mL   BLOOD ADMINISTERED:none  DRAINS: none   LOCAL MEDICATIONS USED:  MARCAINE    and LIDOCAINE   SPECIMEN:  No Specimen  DISPOSITION OF SPECIMEN:  N/A  COUNTS:  YES  TOURNIQUET:  * No tourniquets in log *  DICTATION: Patient has implanted VIM thalamic stimulator electrodes and IPGs, which are now depleted.  It was elected for patient to undergo bilateral IPG revision.  PROCEDURE: Patient was brought to the operating room and given general anesthesia.  Bilateral upper chest was prepped with betadine scrub and Duraprep.  Area of planned incision was infiltrated with lidocaine.  We initially exchanged the right IPG and then the left. Prior incisions were reopened and the old IPGs were externalized.  New IPGs were connected and placed in the pockets.  Wounds were irrigated with  vancomycin. Then irrigated once more.  Incisions were closed with 2-0 Vicryl and 3-0 vicryl sutures and dressed with a sterile occlusive dressing.  Counts were correct at the end of the case.   PLAN OF CARE: Discharge to home after PACU  PATIENT DISPOSITION:  PACU - hemodynamically stable.   Delay start of Pharmacological VTE agent (>24hrs) due to surgical blood loss or risk of bleeding: yes

## 2018-06-24 NOTE — Transfer of Care (Signed)
Immediate Anesthesia Transfer of Care Note  Patient: [name]  Procedure(s) Performed: Bilateral implantable pulse generator battery change (Bilateral Chest)  Patient Location: PACU  Anesthesia Type:General  Level of Consciousness: awake and patient cooperative  Airway & Oxygen Therapy: Patient Spontanous Breathing  Post-op Assessment: Report given to RN and Post -op Vital signs reviewed and stable  Post vital signs: Reviewed and stable  Last Vitals:  Vitals Value Taken Time  BP 97/70 06/24/2018 12:39 PM  Temp    Pulse 82 06/24/2018 12:39 PM  Resp 13 06/24/2018 12:39 PM  SpO2 99 % 06/24/2018 12:39 PM  Vitals shown include unvalidated device data.  Last Pain:  Vitals:   06/24/18 0917  TempSrc:   PainSc: 0-No pain      Patients Stated Pain Goal: 4 (43/60/67 7034)  Complications: No apparent anesthesia complications

## 2018-06-24 NOTE — Interval H&P Note (Signed)
History and Physical Interval Note:  06/24/2018 11:24 AM  [name]  has presented today for surgery, with the diagnosis of End of battery life for deep brain stimulator.  The various methods of treatment have been discussed with the patient and family. After consideration of risks, benefits and other options for treatment, the patient has consented to  Procedure(s) with comments: Bilateral implantable pulse generator battery change (Bilateral) - Bilateral implantable pulse generator battery change as a surgical intervention.  The patient's history has been reviewed, patient examined, no change in status, stable for surgery.  I have reviewed the patient's chart and labs.  Questions were answered to the patient's satisfaction.     Peggyann Shoals

## 2018-06-24 NOTE — Anesthesia Postprocedure Evaluation (Signed)
Anesthesia Post Note  Patient: [name]  Procedure(s) Performed: Bilateral implantable pulse generator battery change (Bilateral Chest)     Patient location during evaluation: PACU Anesthesia Type: General Level of consciousness: awake and alert Pain management: pain level controlled Vital Signs Assessment: post-procedure vital signs reviewed and stable Respiratory status: spontaneous breathing, nonlabored ventilation, respiratory function stable and patient connected to nasal cannula oxygen Cardiovascular status: blood pressure returned to baseline and stable Postop Assessment: no apparent nausea or vomiting Anesthetic complications: no    Last Vitals:  Vitals:   06/24/18 1255 06/24/18 1340  BP: (!) 128/46 (!) 142/88  Pulse: 79 79  Resp: 19 15  Temp:  36.4 C  SpO2: 99% 97%    Last Pain:  Vitals:   06/24/18 1340  TempSrc:   PainSc: 0-No pain                 Effie Berkshire

## 2018-06-24 NOTE — Discharge Instructions (Signed)
Schedule follow up appointment with Dr. Vertell Limber in 3 weeks. Office number [phone]

## 2018-06-24 NOTE — Anesthesia Procedure Notes (Signed)
Procedure Name: Intubation Date/Time: 06/24/2018 11:40 AM Performed by: Effie Berkshire, MD Pre-anesthesia Checklist: Patient identified, Emergency Drugs available, Suction available, Patient being monitored and Timeout performed Patient Re-evaluated:Patient Re-evaluated prior to induction Oxygen Delivery Method: Circle system utilized Preoxygenation: Pre-oxygenation with 100% oxygen Induction Type: IV induction Ventilation: Mask ventilation without difficulty Laryngoscope Size: Mac and 3 Grade View: Grade I Tube type: Oral Tube size: 7.0 mm Number of attempts: 1 Airway Equipment and Method: Stylet Placement Confirmation: ETT inserted through vocal cords under direct vision,  positive ETCO2,  CO2 detector and breath sounds checked- equal and bilateral Secured at: 20 cm Tube secured with: Tape

## 2018-06-27 ENCOUNTER — Encounter: Payer: Self-pay | Admitting: Podiatry

## 2018-06-27 ENCOUNTER — Telehealth: Payer: Self-pay | Admitting: Internal Medicine

## 2018-06-27 DIAGNOSIS — E11622 Type 2 diabetes mellitus with other skin ulcer: Secondary | ICD-10-CM | POA: Diagnosis not present

## 2018-06-27 NOTE — Progress Notes (Signed)
Subjective: [name] presents today with painful, thick toenails 1-5 b/l that she cannot cut and which interfere with daily activities.  Pain is aggravated when wearing enclosed shoe gear.  Patient has wound right lower leg and is receiving wound care once weekly. She has appointment on today.  Abran Richard, MD is her PCP.    Current Outpatient Medications:  .  albuterol (PROAIR HFA) 108 (90 Base) MCG/ACT inhaler, Inhale 2 puffs every 6 hours for asthma - rescue inhaler (Patient taking differently: Inhale 2 puffs into the lungs every 6 (six) hours as needed for shortness of breath. ), Disp: 1 Inhaler, Rfl: 12 .  albuterol (PROVENTIL) (2.5 MG/3ML) 0.083% nebulizer solution, Take 2.5 mg by nebulization every 6 (six) hours as needed for wheezing or shortness of breath., Disp: , Rfl:  .  alum & mag hydroxide-simeth (MAALOX/MYLANTA) 200-200-20 MG/5ML suspension, Take 30 mLs by mouth as needed for indigestion or heartburn. Max 4 doses per 24 hours, Disp: , Rfl:  .  AMBULATORY NON FORMULARY MEDICATION, [place], Spoon, and Knife  DX: G25.0, Disp: 1 Device, Rfl: 0 .  aspirin 81 MG chewable tablet, Chew 81 mg by mouth daily. , Disp: , Rfl:  .  baclofen (LIORESAL) 10 MG tablet, Take 10 mg by mouth at bedtime., Disp: , Rfl:  .  Carboxymethylcellulose Sod PF (REFRESH PLUS) 0.5 % SOLN, Place 1 drop into both eyes 2 (two) times daily. , Disp: , Rfl:  .  divalproex (DEPAKOTE) 500 MG DR tablet, Take 500 mg by mouth 3 (three) times daily. , Disp: , Rfl:  .  docusate sodium (COLACE) 100 MG capsule, Take 1 capsule (100 mg total) by mouth daily., Disp: 30 capsule, Rfl: 0 .  fluticasone furoate-vilanterol (BREO ELLIPTA) 100-25 MCG/INH AEPB, Inhale 1 puff into the lungs daily., Disp: 1 each, Rfl: 11 .  gabapentin (NEURONTIN) 300 MG capsule, Take 300-600 mg by mouth See admin instructions. Take 300 mg in the morning and 600 mg at bedtime, Disp: , Rfl:  .  glucose 4 GM chewable tablet, Chew 20 g by  mouth as needed for low blood sugar (for fsbs < 40)., Disp: , Rfl:  .  guaifenesin (ROBITUSSIN) 100 MG/5ML syrup, Take 200 mg by mouth every 6 (six) hours as needed for cough., Disp: , Rfl:  .  HYDROcodone-acetaminophen (NORCO) 7.5-325 MG tablet, Take 1 tablet by mouth every 8 (eight) hours as needed for moderate pain., Disp: , Rfl:  .  HYDROcodone-acetaminophen (NORCO/VICODIN) 5-325 MG tablet, Take 1 tablet by mouth 3 (three) times daily., Disp: , Rfl:  .  hydrocortisone (PROCTOCORT) 1 % CREA, Apply 1 application topically 2 (two) times daily as needed (hemorrhoid flare)., Disp: , Rfl:  .  ibuprofen (ADVIL,MOTRIN) 200 MG tablet, Take 200 mg by mouth every 6 (six) hours as needed for fever, headache or moderate pain. , Disp: , Rfl:  .  insulin glargine (LANTUS) 100 UNIT/ML injection, Inject 20 Units into the skin at bedtime. , Disp: , Rfl:  .  ipratropium (ATROVENT) 0.03 % nasal spray, 1-2 puffs each nostril 3 times daily, before meals (Patient taking differently: Place 1 spray into both nostrils 3 (three) times daily. ), Disp: 30 mL, Rfl: 11 .  lactulose (CHRONULAC) 10 GM/15ML solution, Take 10 g by mouth daily. , Disp: , Rfl:  .  levothyroxine (SYNTHROID, LEVOTHROID) 75 MCG tablet, TAKE (1) TABLET BY MOUTH ONCE DAILY BEFORE BREAKFAST. (Patient taking differently: Take 75 mcg by mouth daily before breakfast. ), Disp: 30  tablet, Rfl: 5 .  loperamide (IMODIUM A-D) 2 MG tablet, Take 2 mg by mouth 4 (four) times daily as needed for diarrhea or loose stools. , Disp: , Rfl:  .  magnesium hydroxide (MILK OF MAGNESIA) 400 MG/5ML suspension, Take 30 mLs by mouth at bedtime as needed for mild constipation. , Disp: , Rfl:  .  Menthol, Topical Analgesic, (BIOFREEZE) 4 % GEL, Apply 1 application topically 2 (two) times daily. Apply to lower back and base of right thumb twice daily., Disp: , Rfl:  .  metoprolol succinate (TOPROL-XL) 50 MG 24 hr tablet, Take 50 mg by mouth daily. Take with or immediately following a  meal., Disp: , Rfl:  .  Multiple Vitamins-Minerals (MULTIVITAMINS THER. W/MINERALS) TABS tablet, TAKE ONE TABLET BY MOUTH ONCE DAILY. (Patient taking differently: Take 1 tablet by mouth daily. ), Disp: 30 each, Rfl: 11 .  neomycin-bacitracin-polymyxin (NEOSPORIN) ointment, Apply 1 application topically as needed for wound care., Disp: , Rfl:  .  NOVOLOG FLEXPEN 100 UNIT/ML FlexPen, Inject 8 Units into the skin 3 (three) times daily before meals. , Disp: , Rfl:  .  Omega 3 1000 MG CAPS, TAKE (1) CAPSULE BY MOUTH ONCE DAILY. (Patient taking differently: Take 1,000 mg by mouth daily. ), Disp: 30 capsule, Rfl: 11 .  pantoprazole (PROTONIX) 40 MG tablet, Take 40 mg by mouth daily., Disp: , Rfl:  .  Polyethyl Glycol-Propyl Glycol (SYSTANE ULTRA) 0.4-0.3 % SOLN, Place 1 drop into both eyes 4 (four) times daily as needed (dry eyes)., Disp: , Rfl:  .  polyethylene glycol (MIRALAX / GLYCOLAX) packet, Take 17 g by mouth daily as needed for moderate constipation., Disp: 14 each, Rfl: 0 .  potassium chloride SA (K-DUR,KLOR-CON) 20 MEQ tablet, Take 20 mEq by mouth daily., Disp: , Rfl:  .  primidone (MYSOLINE) 50 MG tablet, Take 50 mg by mouth daily. , Disp: , Rfl:  .  QUEtiapine (SEROQUEL) 100 MG tablet, Take 100 mg by mouth at bedtime., Disp: , Rfl:  .  Respiratory Therapy Supplies (FLUTTER) DEVI, Use flutter device 3 times a day (Patient not taking: Reported on 06/16/2018), Disp: 1 each, Rfl: 0 .  simvastatin (ZOCOR) 5 MG tablet, Take 5 mg by mouth at bedtime. , Disp: , Rfl:  .  sitaGLIPtin (JANUVIA) 100 MG tablet, Take 100 mg by mouth daily., Disp: , Rfl:  .  sodium chloride (OCEAN) 0.65 % SOLN nasal spray, Take 1-2 sprays in nostril every hour as needed for rhinitis (Patient taking differently: Place 1 spray into both nostrils every hour as needed for congestion. ), Disp: 30 mL, Rfl: PRN .  sodium chloride 1 g tablet, Take 1 g by mouth 2 (two) times daily., Disp: , Rfl:  .  sucralfate (CARAFATE) 1 g tablet,  Take 1 g by mouth 2 (two) times daily. , Disp: , Rfl:   Allergies  Allergen Reactions  . Invokana [Canagliflozin] Other (See Comments)    Yeast infectios  . Losartan Other (See Comments)    Angioedema   . Metformin And Related Diarrhea  . Latex Other (See Comments)    Other Reaction: redness, blisters  . Other Other (See Comments)    Air fresheners - on MAR  . Oxycodone-Acetaminophen Itching and Other (See Comments)    TYLOX. Caused internal itching per patient   . Mucinex [Guaifenesin Er] Other (See Comments)    MUCINEX REACTION: asthma attacks    Objective:  Vascular Examination: Capillary refill time immediate x 10 digits.  Dorsalis  pedis and Posterior tibial pulses palpable b/l.  Digital hair sparse x 10 digits.  Skin temperature gradient WNL b/l.  Dermatological Examination: Skin with normal turgor, texture and tone b/l.  Toenails 1-5 b/l discolored, thick, dystrophic with subungual debris and pain with palpation to nailbeds due to thickness of nails.  Dressing right leg clean, dry and intact.  Musculoskeletal: Muscle strength 5/5 to all LE muscle groups  No gross bony deformities b/l.  No pain, crepitus or joint limitation noted with ROM.   Neurological: Sensation decreased with 10 gram monofilament.  Assessment: Painful onychomycosis toenails 1-5 b/l  NIDDM with neuropathy  Plan: 1. Toenails 1-5 b/l were debrided in length and girth without iatrogenic bleeding. 2. Patient to continue soft, supportive shoe gear daily. 3. Patient to report any pedal injuries to medical professional immediately. 4. Follow up 3 months.  5. Patient/POA to call should there be a concern in the interim.

## 2018-06-27 NOTE — Telephone Encounter (Signed)
Called patient unable to reach and unable to leave voicemail as phone kept ringing.

## 2018-06-28 NOTE — Telephone Encounter (Signed)
[place] to speak with pt and spoke with her [facility] stating that pt had called our office requesting a Rx of regular cough meds to be sent to pharmacy for her. Per [place], they use the house stock of cough meds they have there to provide for pt when needed.  Nothing further needed.

## 2018-07-04 ENCOUNTER — Telehealth: Payer: Self-pay | Admitting: Internal Medicine

## 2018-07-04 MED ORDER — AZITHROMYCIN 1 G PO PACK: 1.0000 g | PACK | Freq: Once | ORAL | 0 refills | Status: AC

## 2018-07-04 MED ORDER — DEXTROMETHORPHAN POLISTIREX ER 30 MG/5ML PO SUER: 30.0000 mg | Freq: Two times a day (BID) | ORAL | 1 refills | Status: DC | PRN

## 2018-07-04 NOTE — Telephone Encounter (Signed)
Sent Delsym as well per CY. Sharyn Lull Is aware nothing further needed at this time.

## 2018-07-04 NOTE — Telephone Encounter (Signed)
Offer Zpak    250 mg, # 6, 2 today then one daily 

## 2018-07-04 NOTE — Telephone Encounter (Signed)
[name] From [facility] that the patient for a week and half now has been coughing up yellow mucus and sob. She does not have a fever at this time.  They ask that she not be sent in  medcation  narcotic in due to the patient drug seeking for this type of medication.   Cy please advise

## 2018-07-05 DIAGNOSIS — E11622 Type 2 diabetes mellitus with other skin ulcer: Secondary | ICD-10-CM | POA: Diagnosis not present

## 2018-07-14 ENCOUNTER — Encounter (HOSPITAL_BASED_OUTPATIENT_CLINIC_OR_DEPARTMENT_OTHER): Payer: Medicare Other | Attending: Internal Medicine

## 2018-07-14 DIAGNOSIS — E11622 Type 2 diabetes mellitus with other skin ulcer: Secondary | ICD-10-CM | POA: Diagnosis not present

## 2018-07-14 DIAGNOSIS — J449 Chronic obstructive pulmonary disease, unspecified: Secondary | ICD-10-CM | POA: Diagnosis not present

## 2018-07-14 DIAGNOSIS — I10 Essential (primary) hypertension: Secondary | ICD-10-CM | POA: Insufficient documentation

## 2018-07-14 DIAGNOSIS — G4730 Sleep apnea, unspecified: Secondary | ICD-10-CM | POA: Insufficient documentation

## 2018-07-14 DIAGNOSIS — L97311 Non-pressure chronic ulcer of right ankle limited to breakdown of skin: Secondary | ICD-10-CM | POA: Insufficient documentation

## 2018-07-20 ENCOUNTER — Ambulatory Visit: Payer: Self-pay | Admitting: Internal Medicine

## 2018-07-21 DIAGNOSIS — E11622 Type 2 diabetes mellitus with other skin ulcer: Secondary | ICD-10-CM | POA: Diagnosis not present

## 2018-07-25 ENCOUNTER — Telehealth: Payer: Self-pay | Admitting: Internal Medicine

## 2018-07-25 ENCOUNTER — Encounter: Payer: Self-pay | Admitting: Neurology

## 2018-07-25 MED ORDER — DEXTROMETHORPHAN POLISTIREX ER 30 MG/5ML PO SUER: 30.0000 mg | Freq: Two times a day (BID) | ORAL | 5 refills | Status: DC | PRN

## 2018-07-25 NOTE — Telephone Encounter (Signed)
Dr. Annamaria Boots is it ok to authorize refill on Delsym? Last auth was 07/04/18 and it appeared to have 1 refill.

## 2018-07-25 NOTE — Telephone Encounter (Signed)
Ok to refill now, total 6 months

## 2018-07-25 NOTE — Telephone Encounter (Signed)
Refill of delsym has been sent to preferred pharmacy for pt. [facility] and spoke with Sharyn Lull letting her know the Rx was refilled for pt. Sharyn Lull expressed understanding. Nothing further needed.

## 2018-08-04 DIAGNOSIS — E11622 Type 2 diabetes mellitus with other skin ulcer: Secondary | ICD-10-CM | POA: Diagnosis not present

## 2018-08-11 ENCOUNTER — Telehealth: Payer: Self-pay | Admitting: Internal Medicine

## 2018-08-11 NOTE — Telephone Encounter (Signed)
[facility] receptionist was unable to get a nurse or administrator on the phone. Left to call back

## 2018-08-11 NOTE — Telephone Encounter (Signed)
[facility] [phone] not able to reach anyone. The automated vmail came on, after that no one picked up, the line remained silent.

## 2018-08-11 NOTE — Telephone Encounter (Signed)
When we reach them/ tomorrow, ok to offer doxycycline 100 mg, # 8, 2 today then one daily

## 2018-08-11 NOTE — Telephone Encounter (Signed)
Dr. Annamaria Boots I attempted to call [facility]. I'm sure you are familiar with this patient. Nurse called c/o of cough with green mucus. No fever/body aches/chills.  Pt was given delsym with refills on 3/30 and zpak.

## 2018-08-12 MED ORDER — DOXYCYCLINE HYCLATE 100 MG PO TABS: ORAL_TABLET | ORAL | 0 refills | Status: DC

## 2018-08-12 NOTE — Telephone Encounter (Signed)
[employer] and spoke to [employer]. Offered Doxy 100 mg and confirmed it would be sent to [place] on file. Nothing further needed.

## 2018-08-18 ENCOUNTER — Encounter (HOSPITAL_BASED_OUTPATIENT_CLINIC_OR_DEPARTMENT_OTHER): Payer: Medicare Other | Attending: Internal Medicine

## 2018-08-18 DIAGNOSIS — J449 Chronic obstructive pulmonary disease, unspecified: Secondary | ICD-10-CM | POA: Insufficient documentation

## 2018-08-18 DIAGNOSIS — Z794 Long term (current) use of insulin: Secondary | ICD-10-CM | POA: Diagnosis not present

## 2018-08-18 DIAGNOSIS — E11622 Type 2 diabetes mellitus with other skin ulcer: Secondary | ICD-10-CM | POA: Insufficient documentation

## 2018-08-18 DIAGNOSIS — G4730 Sleep apnea, unspecified: Secondary | ICD-10-CM | POA: Diagnosis not present

## 2018-08-18 DIAGNOSIS — L97312 Non-pressure chronic ulcer of right ankle with fat layer exposed: Secondary | ICD-10-CM | POA: Diagnosis not present

## 2018-08-18 DIAGNOSIS — I10 Essential (primary) hypertension: Secondary | ICD-10-CM | POA: Insufficient documentation

## 2018-09-01 DIAGNOSIS — E11622 Type 2 diabetes mellitus with other skin ulcer: Secondary | ICD-10-CM | POA: Diagnosis not present

## 2018-09-12 ENCOUNTER — Encounter: Payer: Self-pay | Admitting: Podiatry

## 2018-09-15 ENCOUNTER — Encounter (HOSPITAL_BASED_OUTPATIENT_CLINIC_OR_DEPARTMENT_OTHER): Payer: Medicare Other | Attending: Internal Medicine

## 2018-09-15 DIAGNOSIS — E11622 Type 2 diabetes mellitus with other skin ulcer: Secondary | ICD-10-CM | POA: Insufficient documentation

## 2018-09-15 DIAGNOSIS — J449 Chronic obstructive pulmonary disease, unspecified: Secondary | ICD-10-CM | POA: Diagnosis not present

## 2018-09-15 DIAGNOSIS — I10 Essential (primary) hypertension: Secondary | ICD-10-CM | POA: Diagnosis not present

## 2018-09-15 DIAGNOSIS — G4730 Sleep apnea, unspecified: Secondary | ICD-10-CM | POA: Diagnosis not present

## 2018-09-15 DIAGNOSIS — L97311 Non-pressure chronic ulcer of right ankle limited to breakdown of skin: Secondary | ICD-10-CM | POA: Diagnosis not present

## 2018-09-16 NOTE — Progress Notes (Signed)
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   Procedure: ESOPHAGOGASTRODUODENOSCOPY (EGD) WITH PROPOFOL;  Surgeon: Daneil Dolin, MD;  [facility];  Service: Endoscopy;  Laterality: N/A;  10:15am   FINGER SURGERY     HEMORRHOID BANDING  2017   Dr.[name]   LEFT ANKLE LIGAMENT REPAIR     x 2   LEFT ELBOW REPAIR     MALONEY DILATION N/A 05/06/2017   Procedure: MALONEY DILATION;  Surgeon: Daneil Dolin, MD;  [facility];  Service: Endoscopy;  Laterality: N/A;   MAXIMUM ACCESS (MAS)POSTERIOR LUMBAR INTERBODY FUSION (PLIF) 1 LEVEL N/A 04/24/2014   Procedure: Lumbar four-five Maximum access Surgery posterior lumbar interbody fusion;  Surgeon: [name], MD;  Location: [place] [facility];  Service: Neurosurgery;  Laterality: N/A;  Lumbar four-five Maximum access Surgery posterior lumbar interbody fusion   MULTIPLE EXTRACTIONS WITH ALVEOLOPLASTY N/A 06/18/2017   Procedure: MULTIPLE EXTRACTION;  Surgeon: Diona Browner, DDS;  Location: [place];  Service: Oral Surgery;  Laterality: N/A;   PARTIAL HYSTERECTOMY     POLYPECTOMY  03/04/2015   Procedure: POLYPECTOMY (descending colon);  Surgeon: Daneil Dolin, MD;  Location: [facility];  Service: Endoscopy;;   PULSE GENERATOR IMPLANT Bilateral 12/15/2013  Procedure: BILATERAL PULSE GENERATOR  IMPLANT;  Surgeon: [name], MD;  Location: [place] [facility];  Service: Neurosurgery;  Laterality: Bilateral;  BILATERAL   RIGHT BREAST CYST     benign   SUBTHALAMIC STIMULATOR BATTERY REPLACEMENT Bilateral 06/24/2018   Procedure: Bilateral implantable pulse generator battery change;  Surgeon: [name], MD;  Location: [place];  Service: Neurosurgery;  Laterality: Bilateral;  bilateral   SUBTHALAMIC STIMULATOR INSERTION Bilateral 12/04/2013   Procedure: Bilateral Deep brain stimulator placement;  Surgeon: [name], MD;  Location: [place] [facility];  Service: Neurosurgery;  Laterality: Bilateral;  Bilateral Deep brain stimulator placement   TONSILLECTOMY      Social History   Socioeconomic History   Marital status: Legally Separated    Spouse name: Not on file   Number of children: Not on file   Years of education: Not on file   Highest education level: Bachelor's degree (e.g., BA, AB, BS)  Occupational History   Occupation: retired  Scientist, product/process development strain: Not on file   Food insecurity    Worry: Not on file    Inability: Not on Lexicographer needs    Medical: Not on file    Non-medical: Not on file  Tobacco Use   Smoking status: Former Smoker    Packs/day: 0.30    Years: 10.00    Pack years: 3.00    Types: Cigarettes    Quit date: 04/23/1993    Years since quitting: 25.4   Smokeless tobacco: Never Used   Tobacco comment: quit smoking 25+yrs ago  Substance and Sexual Activity   Alcohol use: No    Alcohol/week: 0.0 standard drinks   Drug use: No   Sexual activity: Not Currently  Lifestyle   Physical activity    Days per week: Not on file    Minutes per session: Not on file   Stress: Not on file  Relationships   Social connections    Talks on phone: Not on file    Gets together: Not on file    Attends religious service: Not on file    Active member of club or organization: Not on file    Attends meetings of clubs or  organizations: Not on file    Relationship status: Not on file   Intimate partner violence    Fear of current or ex partner: Not on file    Emotionally abused: Not on file    Physically abused: Not on file    Forced sexual activity: Not on file  Other Topics Concern   Not on file  Social History Narrative   Married, 1 daughte, living.  Lives with husband in [place], [place].   1 child died brain tumor at age 88.   Two grandchildren.   Works at [employer], patient currently lost her job.   Retired 2011.   No tobacco.   Alcohol: none in 30 yrs.  Distant history of heavy use.   No drug use.    Family Status  Relation Name Status   Father  Deceased       lung cancer, diabetes   Mother  Deceased       COPD, diabetes   Son  Deceased at age 38       brain tumor (neuroblastoma)   Sister  Deceased       blood clot   Daughter  Alive       healthy   Brother  (Not Specified)   Neg  Hx  (Not Specified)    Review of Systems ROS    Objective:   VITALS:   Vitals:   09/19/18 1448  BP: (!) 148/78  Pulse: 83  Temp: 98.7 F (37.1 C)  SpO2: 98%  Weight: 200 lb 12.8 oz (91.1 kg)   Wt Readings from Last 3 Encounters:  09/19/18 200 lb 12.8 oz (91.1 kg)  06/24/18 184 lb (83.5 kg)  06/16/18 194 lb (88 kg)      Gen: Appears stated age and in NAD.   HEENT: Normocephalic, AT.  MMM.  She is edentulous.   Cardiovascular: Regular rate rhythm Lungs: Clear to auscultation bilaterally Neck: No bruits bilaterally  NEUROLOGICAL:  Orientation: The patient is alert and oriented x 3.  Cranial nerves: There is good facial symmetry.  Speech is fluent and clear but has a pseudobulbar quality, which is unchanged from previous. Soft palate rises symmetrically and there is no tongue deviation. Hearing is intact to conversational tone.  Tone: Tone is good throughout.  Sensation: Sensation is intact to light touch throughout. Coordination: The patient has no dysdiadichokinesia.     Motor: Strength is at least antigravity x4 Gait and Station: [employer]   MOVEMENT EXAM:  Tremor: Patient has tremor of the bilateral upper extremities, but has a significant ataxic component to it as well.  The right is significantly worse than the left.  When asked to draw Archimedes spirals, she does not do well, but it is more ataxic than pure tremor.    DBS programming was performed today which is described in more detail in a separate programming procedural notes.     Assessment/Plan:   1.  Essential Tremor.  -This is evidenced by the symmetrical nature and longstanding hx of gradually getting worse.  -The patient is status post bilateral VIM DBS on 12/04/2013 with bilateral generator placement on 12/15/2013 and 06/24/18.  Postoperative imaging indicates that leads are perhaps just slightly too lateral and more inferior than anticipated.    -I was worried that the device was contributing to mood change.  However, the device was turned off for 5 weeks and the facility reports that her mood was not only better, but that it was worse.  In addition, tremor was markedly worse.    -Reiterated to the patient that Depakote and quetiapine both can contribute to tremor.  -only on primidone 50 mg daily.  Wants to stay on that. 2.  Diabetic peripheral neuropathy  -on gabapentin with pain management.  Feels that neuropathy is getting worse and told her to discuss with pain management. 3.  L4 radiculopathy  -S/P surgery and back pain was better but has returned and on opioids still 4.  Multifactorial balance d/o  -There is no longer walking except with physical therapy. 5.  F/u 6-8 months

## 2018-09-19 ENCOUNTER — Other Ambulatory Visit: Payer: Self-pay

## 2018-09-19 ENCOUNTER — Ambulatory Visit (INDEPENDENT_AMBULATORY_CARE_PROVIDER_SITE_OTHER): Payer: Medicare Other | Admitting: Neurology

## 2018-09-19 ENCOUNTER — Encounter: Payer: Self-pay | Admitting: Neurology

## 2018-09-19 VITALS — BP 148/78 | HR 83 | Temp 98.7°F | Wt 200.8 lb

## 2018-09-19 DIAGNOSIS — G251 Drug-induced tremor: Secondary | ICD-10-CM

## 2018-09-19 DIAGNOSIS — G250 Essential tremor: Secondary | ICD-10-CM | POA: Diagnosis not present

## 2018-09-19 NOTE — Procedures (Signed)
DBS Programming was performed.    Total time spent programming was 15 minutes.  Device was confirmed to be on  Soft start was confirmed to be on.  Impedences were checked and were within normal limits.  Battery was checked and was determined to be at end of life on the right only (2.97 on the right and 2.95 on the left).    Final settings were as follows, after multiple settings were tried on the left brain:  Left brain electrode:     1-C+  ; Amplitude  3.3   V   ; Pulse width 60 microseconds;   Frequency   130   Hz.  Right brain electrode:     3-2+          ; Amplitude   3.8  V ;  Pulse width 90  microseconds;  Frequency   140    Hz.

## 2018-09-22 ENCOUNTER — Ambulatory Visit: Payer: Medicare Other | Admitting: Podiatry

## 2018-09-27 ENCOUNTER — Encounter: Payer: Self-pay | Admitting: Gastroenterology

## 2018-09-27 NOTE — Progress Notes (Signed)
Referring Provider: Abran Richard, MD Primary Care Physician:  Abran Richard, MD  Primary GI: Dr. Gala Romney  Chief Complaint  Patient presents with  . Dysphagia    trouble with foods and liquids, hard time going down, also has spasms in throat x 3 weeks    HPI:   [name] is a 68 y.o. female presenting today for dysphagia.   Last seen in our office on 06/16/18 for drug induced constipation, GERD, and esophageal stenosis. Overall, was doing well at that time with only having trouble swallowing with potato chips which was resolved with drinking plenty of water. Recommendations to continue PPI for GERD, MOM, and Lactulose as needed for constipation, and drinking plenty of fluids to help with dysphagia.   Today patient states for the last 2 months she has been having solid food dysphagia. Typically happens with meats, tries drinking water to help move food down, but this isn't working and she has to bring food back up.  No trouble with soft foods. Remembers one time getting chocked on liquids. Drinking liquids through straw. Only has half and hour to eat. She eats slowly bc swallowing difficulty. When she feels rushed at meals sometimes feels like her throat tightens up. States these symptoms are similar to in the past prior to dilations.   Upper abdominal pain x2 weeks. Thinks it started when she was coughing. She has been having a bad cough and is taking robitussin. Thinks heartburn medication makes it settle down a little while. Admits to having "bad reflux" about 4 times a week and states she is not getting Protonix daily before breakfast, but that she has to ask for this when she has reflux.    Occasional nausea, every once in a while. No vomiting. Nausea goes away on its own. Was recently on antibiotic for bladder infection. While on antibiotic, she had post prandial diarrhea. This has stopped and BMs are starting to get back to normal. Diarrhea once yesterday.  Will take imodium  occasionally if diarrhea. No hematochezia or melena. No other abdominal pain.   Belly button is sore. Started yesterday. Put finger in it and something round came out. Showed to med tech and they told her to take a Q-tip with water to clean it out. White stuff came out, doesn't know if it was soap or infection.   Takes ibuprofen as needed for pain. She has a lot of chronic back and joint pain.  States she has a lot of congestion symptoms. Has COPD and Asthma. Some shortness of breath, at her baseline.   Past Medical History:  Diagnosis Date  . Allergic rhinitis    uses Nasonex daily as needed  . Allergy to angiotensin receptor blockers (ARB) 01/22/2014   Angioedema  . Anxiety    takes Ativan daily as needed  . Aspiration pneumonia ([place])   . Asthma    Albuterol inhaler and Neb daily as needed  . 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 [name], residual palpitations  . Bipolar 1 disorder ([place])   . Chronic back pain    reason unknown  . Constipation   . COPD (chronic obstructive pulmonary disease) ([place])   . DDD (degenerative disc disease), lumbar   . Depression    takes Zoloft daily as well as Cymbalta  . Diabetes mellitus    Type II  . Dysphagia   . Dysrhythmia    SVT ==ablation done by Dr. Rayann Heman  2007  . Esophageal reflux    takes Zantac daily  . Frequent headaches   . History of bronchitis Dec 2014  . Hypertension    takes Losartan daily  . Hypothyroidism    takes Synthroid daily  . Joint pain   . Joint swelling    left ankle  . Low sodium syndrome   . Lumbar radiculopathy   . Seizures ([place]) 04-05-13   one time ran out of Primidone and had stopped it cold [place]  . SVT (supraventricular tachycardia) ([place])   . Tremor, essential    takes Mysoline and Neurontin daily.  . Vocal cord dysfunction   . Weakness    in left arm    Past Surgical History:  Procedure Laterality Date  . ABDOMINAL HYSTERECTOMY    . ABLATION FOR  AVNRT    . APPENDECTOMY    . BARTHOLIN GLAND CYST EXCISION     x 2  . BIOPSY  03/04/2015   Procedure: BIOPSY (Duodenal, Gastric);  Surgeon: Daneil Dolin, MD;  [facility];  Service: Endoscopy;;  . BREAST SURGERY    . CESAREAN SECTION    . CHOLECYSTECTOMY    . COLONOSCOPY  01/16/2004   YCX:[id number] rectum/colon  . COLONOSCOPY WITH PROPOFOL N/A 03/04/2015   Dr.[name]- internal hemorrhoids, pancolonic diverticulosis, colonic polyp= tubular adenoma  . DEEP BRAIN STIMULATOR PLACEMENT    . DENTAL SURGERY    . ESOPHAGEAL DILATION N/A 03/04/2015   Procedure: ESOPHAGEAL DILATION WITH 54FR MALONEY DILATOR;  Surgeon: Daneil Dolin, MD;  Location[facility];  Service: Endoscopy;  Laterality: N/A;  . ESOPHAGOGASTRODUODENOSCOPY (EGD) WITH ESOPHAGEAL DILATION  04/03/2002   UDJ:[id number]'C ring, otherwise normal esophagus, status post dilation with 56 F/Normal stomach  . ESOPHAGOGASTRODUODENOSCOPY (EGD) WITH ESOPHAGEAL DILATION N/A 11/08/2012   Dr. [name]:schatzki's ring s/p dilation/hiatal hernia  . ESOPHAGOGASTRODUODENOSCOPY (EGD) WITH PROPOFOL N/A 03/04/2015   Dr.[name]- schatzki's ring, hiatal hernia, scattered erosions bx= chronic inactive gastritis  . ESOPHAGOGASTRODUODENOSCOPY (EGD) WITH PROPOFOL N/A 05/06/2017   Normal esophagus. Dilated. Small hiatal hernia. Normal duodenal bulb and second portion of duodenum.  Marland Kitchen FINGER SURGERY    . HEMORRHOID BANDING  2017   Dr.[name]  . LEFT ANKLE LIGAMENT REPAIR     x 2  . LEFT ELBOW REPAIR    . MALONEY DILATION N/A 05/06/2017   Procedure: Venia Minks DILATION;  Surgeon: Daneil Dolin, MD;  [facility];  Service: Endoscopy;  Laterality: N/A;  . MAXIMUM ACCESS (MAS)POSTERIOR LUMBAR INTERBODY FUSION (PLIF) 1 LEVEL N/A 04/24/2014   Procedure: Lumbar four-five Maximum access Surgery posterior lumbar interbody fusion;  Surgeon: [name], MD;  Location: [place] [facility];  Service: Neurosurgery;  Laterality: N/A;  Lumbar four-five Maximum access Surgery  posterior lumbar interbody fusion  . MULTIPLE EXTRACTIONS WITH ALVEOLOPLASTY N/A 06/18/2017   Procedure: MULTIPLE EXTRACTION;  Surgeon: Diona Browner, DDS;  Location: [place];  Service: Oral Surgery;  Laterality: N/A;  . PARTIAL HYSTERECTOMY    . POLYPECTOMY  03/04/2015   Procedure: POLYPECTOMY (descending colon);  Surgeon: Daneil Dolin, MD;  [facility];  Service: Endoscopy;;  . PULSE GENERATOR IMPLANT Bilateral 12/15/2013   Procedure: BILATERAL PULSE GENERATOR IMPLANT;  Surgeon: [name], MD;  Location: [place] [facility];  Service: Neurosurgery;  Laterality: Bilateral;  BILATERAL  . RIGHT BREAST CYST     benign  . SUBTHALAMIC STIMULATOR BATTERY REPLACEMENT Bilateral 06/24/2018   Procedure: Bilateral implantable pulse generator battery change;  Surgeon: [name], MD;  Location: [facility];  Service: Neurosurgery;  Laterality: Bilateral;  bilateral  . SUBTHALAMIC STIMULATOR INSERTION Bilateral 12/04/2013   Procedure: Bilateral Deep brain stimulator placement;  Surgeon: [name], MD;  Location: [place] [facility];  Service: Neurosurgery;  Laterality: Bilateral;  Bilateral Deep brain stimulator placement  . TONSILLECTOMY      Current Outpatient Medications  Medication Sig Dispense Refill  . acetaminophen (TYLENOL) 500 MG tablet Take 500 mg by mouth every 4 (four) hours as needed.    Marland Kitchen albuterol (PROAIR HFA) 108 (90 Base) MCG/ACT inhaler Inhale 2 puffs every 6 hours for asthma - rescue inhaler (Patient taking differently: Inhale 2 puffs into the lungs every 6 (six) hours as needed for shortness of breath. ) 1 Inhaler 12  . albuterol (PROVENTIL) (2.5 MG/3ML) 0.083% nebulizer solution Take 2.5 mg by nebulization every 6 (six) hours as needed for wheezing or shortness of breath.    Marland Kitchen alum & mag hydroxide-simeth (MAALOX/MYLANTA) 200-200-20 MG/5ML suspension Take 30 mLs by mouth as needed for indigestion or heartburn. Max 4 doses per 24 hours    . AMBULATORY NON FORMULARY MEDICATION [place],  Spoon, and Knife  DX: G25.0 1 Device 0  . aspirin 81 MG chewable tablet Chew 81 mg by mouth daily.     . baclofen (LIORESAL) 10 MG tablet Take 10 mg by mouth at bedtime.    . Carboxymethylcellulose Sod PF (REFRESH PLUS) 0.5 % SOLN Place 1 drop into both eyes 2 (two) times daily.     . clotrimazole-betamethasone (LOTRISONE) cream Apply 1 application topically 2 (two) times daily.    Marland Kitchen dextromethorphan (DELSYM) 30 MG/5ML liquid Take 5 mLs (30 mg total) by mouth 2 (two) times daily as needed for cough (Every 12 hours as needed). 280 mL 5  . divalproex (DEPAKOTE) 500 MG DR tablet Take 500 mg by mouth 3 (three) times daily.     Marland Kitchen docusate sodium (COLACE) 100 MG capsule Take 1 capsule (100 mg total) by mouth daily. 30 capsule 0  . fluticasone furoate-vilanterol (BREO ELLIPTA) 100-25 MCG/INH AEPB Inhale 1 puff into the lungs daily. 1 each 11  . gabapentin (NEURONTIN) 300 MG capsule Take 300-600 mg by mouth See admin instructions. Take 300 mg in the morning and 600 mg at bedtime    . HYDROcodone-acetaminophen (NORCO/VICODIN) 5-325 MG tablet Take 1 tablet by mouth 3 (three) times daily.    . hydrocortisone (PROCTOCORT) 1 % CREA Apply 1 application topically 2 (two) times daily as needed (hemorrhoid flare).    Marland Kitchen ibuprofen (ADVIL,MOTRIN) 200 MG tablet Take 200 mg by mouth every 6 (six) hours as needed for fever, headache or moderate pain.     Marland Kitchen insulin glargine (LANTUS) 100 UNIT/ML injection Inject 20 Units into the skin at bedtime.     Marland Kitchen ipratropium (ATROVENT) 0.03 % nasal spray 1-2 puffs each nostril 3 times daily, before meals (Patient taking differently: Place 1 spray into both nostrils 3 (three) times daily. ) 30 mL 11  . lactulose (CHRONULAC) 10 GM/15ML solution Take 10 g by mouth daily.     Marland Kitchen levothyroxine (SYNTHROID, LEVOTHROID) 75 MCG tablet TAKE (1) TABLET BY MOUTH ONCE DAILY BEFORE BREAKFAST. (Patient taking differently: Take 75 mcg by mouth daily before breakfast. ) 30 tablet 5  . loperamide  (IMODIUM A-D) 2 MG tablet Take 2 mg by mouth 4 (four) times daily as needed for diarrhea or loose stools.     . magnesium hydroxide (MILK OF MAGNESIA) 400 MG/5ML suspension Take 30 mLs by mouth at bedtime as  needed for mild constipation.     . Menthol, Topical Analgesic, (BIOFREEZE) 4 % GEL Apply 1 application topically 2 (two) times daily. Apply to lower back and base of right thumb twice daily.    . metoprolol succinate (TOPROL-XL) 50 MG 24 hr tablet Take 50 mg by mouth daily. Take with or immediately following a meal.    . Multiple Vitamins-Minerals (MULTIVITAMINS THER. W/MINERALS) TABS tablet TAKE ONE TABLET BY MOUTH ONCE DAILY. (Patient taking differently: Take 1 tablet by mouth daily. ) 30 each 11  . neomycin-bacitracin-polymyxin (NEOSPORIN) ointment Apply 1 application topically as needed for wound care.    Marland Kitchen NOVOLOG FLEXPEN 100 UNIT/ML FlexPen Inject 8 Units into the skin 3 (three) times daily before meals.     . Omega 3 1000 MG CAPS TAKE (1) CAPSULE BY MOUTH ONCE DAILY. (Patient taking differently: Take 1,000 mg by mouth daily. ) 30 capsule 11  . pantoprazole (PROTONIX) 40 MG tablet Take 40 mg by mouth daily.    Vladimir Faster Glycol-Propyl Glycol (SYSTANE ULTRA) 0.4-0.3 % SOLN Place 1 drop into both eyes 4 (four) times daily as needed (dry eyes).    . polyethylene glycol (MIRALAX / GLYCOLAX) packet Take 17 g by mouth daily as needed for moderate constipation. 14 each 0  . potassium chloride SA (K-DUR,KLOR-CON) 20 MEQ tablet Take 20 mEq by mouth daily.    . primidone (MYSOLINE) 50 MG tablet Take 50 mg by mouth daily.     . QUEtiapine (SEROQUEL) 100 MG tablet Take 100 mg by mouth at bedtime.    Marland Kitchen Respiratory Therapy Supplies (FLUTTER) DEVI Use flutter device 3 times a day 1 each 0  . simvastatin (ZOCOR) 5 MG tablet Take 5 mg by mouth at bedtime.     . sitaGLIPtin (JANUVIA) 100 MG tablet Take 100 mg by mouth daily.    . sodium chloride (OCEAN) 0.65 % SOLN nasal spray Take 1-2 sprays in nostril  every hour as needed for rhinitis (Patient taking differently: Place 1 spray into both nostrils every hour as needed for congestion. ) 30 mL PRN  . sodium chloride 1 g tablet Take 1 g by mouth 2 (two) times daily.    . sucralfate (CARAFATE) 1 g tablet Take 1 g by mouth 2 (two) times daily.      No current facility-administered medications for this visit.     Allergies as of 09/28/2018 - Review Complete 09/28/2018  Allergen Reaction Noted  . Invokana [canagliflozin] Other (See Comments) 12/05/2014  . Losartan Other (See Comments) 01/22/2014  . Metformin and related Diarrhea 01/18/2013  . Latex Other (See Comments) 07/02/2014  . Other Other (See Comments) 07/02/2014  . Oxycodone-acetaminophen Itching and Other (See Comments) 05/11/2011  . Mucinex [guaifenesin er] Other (See Comments)     Family History  Problem Relation Age of Onset  . Lung cancer Father        DIED AGE 89 LUNG CA  . Alcohol abuse Father   . Anxiety disorder Father   . Depression Father   . COPD Mother        DIED AGE 37,MRSA,COPD,PNEUMONIA  . Pneumonia Mother        DIED AGE 37,MRSA,COPD,PNEUMONIA  . Supraventricular tachycardia Mother   . Anxiety disorder Mother   . Depression Mother   . Alcohol abuse Mother   . Heart disease Sister        DIED AGE 34, SMOKER,?HEART  . COPD Brother        AGE 53  .  Colon cancer Neg Hx     Social History   Socioeconomic History  . Marital status: Legally Separated    Spouse name: Not on file  . Number of children: Not on file  . Years of education: Not on file  . Highest education level: Bachelor's degree (e.g., BA, AB, BS)  Occupational History  . Occupation: retired  Scientific laboratory technician  . Financial resource strain: Not on file  . Food insecurity    Worry: Not on file    Inability: Not on file  . Transportation needs    Medical: Not on file    Non-medical: Not on file  Tobacco Use  . Smoking status: Former Smoker    Packs/day: 0.30    Years: 10.00    Pack  years: 3.00    Types: Cigarettes    Quit date: 04/23/1993    Years since quitting: 25.4  . Smokeless tobacco: Never Used  . Tobacco comment: quit smoking 25+yrs ago  Substance and Sexual Activity  . Alcohol use: No    Alcohol/week: 0.0 standard drinks  . Drug use: No  . Sexual activity: Not Currently  Lifestyle  . Physical activity    Days per week: Not on file    Minutes per session: Not on file  . Stress: Not on file  Relationships  . Social Herbalist on phone: Not on file    Gets together: Not on file    Attends religious service: Not on file    Active member of club or organization: Not on file    Attends meetings of clubs or organizations: Not on file    Relationship status: Not on file  Other Topics Concern  . Not on file  Social History Narrative   Married, 1 daughte, living.  Lives with husband in [place], [place].   1 child died brain tumor at age 4.   Two grandchildren.   Works at [employer], patient currently lost her job.   Retired 2011.   No tobacco.   Alcohol: none in 30 yrs.  Distant history of heavy use.   No drug use.    Review of Systems: Gen: Denies fever, chills.  CV: Denies chest pain, palpitations Resp: See HPI GI: See HPI Heme: Notes some bruising from insulin injections.   Physical Exam: BP 131/89   Pulse 90   Temp 97.9 F (36.6 C) (Oral)   Ht 5' 4.5" (1.638 m)   Wt 200 lb (90.7 kg)   BMI 33.80 kg/m  General:   Alert and oriented.  In a wheelchair. No distress noted. Pleasant and cooperative.  Head:  Normocephalic and atraumatic. Eyes:  Conjuctiva clear without scleral icterus. Heart:  S1, S2 present without murmurs appreciated. Lungs:  Clear to auscultation bilaterally. No wheezes, rales, or rhonchi. No distress.  Abdomen:  +BS, soft, non-distended. Minimal tenderness around bellybutton. Belly button has mild erythema at the edges and inside. No evidence of puss, swelling, or anything obstructing. No rebound or  guarding. No HSM or masses noted. Msk:  Symmetrical without gross deformities.  Neurologic:  Alert and  oriented x4 Psych:  Alert and cooperative. Normal mood and affect.

## 2018-09-28 ENCOUNTER — Other Ambulatory Visit: Payer: Self-pay

## 2018-09-28 ENCOUNTER — Ambulatory Visit (INDEPENDENT_AMBULATORY_CARE_PROVIDER_SITE_OTHER): Payer: Medicare Other | Admitting: Gastroenterology

## 2018-09-28 ENCOUNTER — Encounter: Payer: Self-pay | Admitting: Gastroenterology

## 2018-09-28 VITALS — BP 131/89 | HR 90 | Temp 97.9°F | Ht 64.5 in | Wt 200.0 lb

## 2018-09-28 DIAGNOSIS — R1310 Dysphagia, unspecified: Secondary | ICD-10-CM

## 2018-09-28 DIAGNOSIS — K219 Gastro-esophageal reflux disease without esophagitis: Secondary | ICD-10-CM | POA: Diagnosis not present

## 2018-09-28 DIAGNOSIS — R1033 Periumbilical pain: Secondary | ICD-10-CM | POA: Diagnosis not present

## 2018-09-28 DIAGNOSIS — L539 Erythematous condition, unspecified: Secondary | ICD-10-CM | POA: Insufficient documentation

## 2018-09-28 NOTE — Assessment & Plan Note (Signed)
Patient with one day of soreness of her belly button. Patient tried cleaning her belly button yesterday. Something round came out and later she thinks something white came out after putting a Q-tip in. On exam, there was mild erythema, no puss, no swelling, no obstruction or skin tears. Recommend using neosporin. Follow-up with primary physician if not improvement.

## 2018-09-28 NOTE — Patient Instructions (Addendum)
1. Proceed with upper endoscopy with Dr. Gala Romney in the near future.   Please hold diabetes medications the morning of the procedure.   2. Please resume taking your Protonix 40 mg daily 30 minutes before breakfast   3. You may apply triple antibiotic ointment to your belly button.    We will see you back in 3 months.   Aliene Altes, PA-C [facility]

## 2018-09-28 NOTE — Assessment & Plan Note (Addendum)
Patient has a long history of GERD that had been well controlled on Protonix 40 mg daily at last visit. Now with worsening reflux symptoms about 4 times a week. She has not been taking her Protonix daily although on her med list. States she has to ask for the medication when she has symptoms. Also now with mild epigastric discomfort and worsening dysphagia. Daily aspirin and occasional ibuprofen use.   Resume taking Protonix 40 mg daily 30 minutes before breakfast.  Proceed with EGD with propofol with Dr. Gala Romney in the near future as described below.  Limit ibuprofen as much as possible.

## 2018-09-28 NOTE — Assessment & Plan Note (Deleted)
Patient with one day of soreness of her belly button. Patient tried cleaning her belly button yesterday. Something round came out and later she thinks something white came out after putting a Q-tip in. On exam, there was mild erythema, no puss, no swelling, no obstruction or skin tears. Recommend using neosporin. Follow-up with primary physician if not improvement.

## 2018-09-28 NOTE — Assessment & Plan Note (Signed)
Patient has a long history of dysphagia with schatzki rings noted on past EGDs. She has had 4 dilations in the past, at least 2 documented schatzki's rings. Last EGD 05/06/2017 with normal esophagus and dilation at that time due to dysphagia. Patient had improvement in symptoms and was doing well at her last office visit; however, she has not been taking Protonix daily and started having a lot of reflux again and has subsequently also developed solid food dysphagia again, mostly with meats, having to bring the food back up. Considering her history of schatzki's rings and her return of reflux off PPI, I suspect she may have developed a stricture.   Resume Protonix 40 mg daily 30 minutes before breakfast. Proceed with EGD with possible dilation with propofol in the near future with Dr. Gala Romney. The risks, benefits, and alternatives have been discussed in detail with patient. They have stated understanding and desire to proceed.   Would consider BPE if further dysphagia.

## 2018-09-28 NOTE — Progress Notes (Signed)
cc'ed to pcp °

## 2018-09-29 DIAGNOSIS — E11622 Type 2 diabetes mellitus with other skin ulcer: Secondary | ICD-10-CM | POA: Diagnosis not present

## 2018-10-06 ENCOUNTER — Other Ambulatory Visit: Payer: Self-pay

## 2018-10-06 ENCOUNTER — Encounter: Payer: Self-pay | Admitting: Nurse Practitioner

## 2018-10-06 ENCOUNTER — Ambulatory Visit (INDEPENDENT_AMBULATORY_CARE_PROVIDER_SITE_OTHER): Payer: Medicare Other | Admitting: Nurse Practitioner

## 2018-10-06 ENCOUNTER — Ambulatory Visit: Payer: Medicare Other | Admitting: Nurse Practitioner

## 2018-10-06 DIAGNOSIS — J4540 Moderate persistent asthma, uncomplicated: Secondary | ICD-10-CM

## 2018-10-06 DIAGNOSIS — J45909 Unspecified asthma, uncomplicated: Secondary | ICD-10-CM | POA: Diagnosis not present

## 2018-10-06 MED ORDER — AMOXICILLIN-POT CLAVULANATE 875-125 MG PO TABS: 1.0000 | ORAL_TABLET | Freq: Two times a day (BID) | ORAL | 0 refills | Status: DC

## 2018-10-06 MED ORDER — GUAIFENESIN 100 MG/5ML PO SOLN: 5.0000 mL | ORAL | 0 refills | Status: DC | PRN

## 2018-10-06 NOTE — Progress Notes (Signed)
Virtual Visit via Telephone Note  I connected with [name] on 10/06/18 at 10:00 AM EDT by telephone and verified that I am speaking with the correct person using two identifiers.  Location: Patient: home Provider: office   I discussed the limitations, risks, security and privacy concerns of performing an evaluation and management service by telephone and the availability of in person appointments. I also discussed with the patient that there may be a patient responsible charge related to this service. The patient expressed understanding and agreed to proceed.   History of Present Illness: 68 year old female former smoker with asthma, allergic rhinitis who is followed by Dr. Annamaria Boots. PMH: Anxiety/VCD, CAD, GERD, DM2, tachycardia, tremor/deep brain stimulator, hypertension, now living in assisted living. Maintenance: Breo, Proventil nebulizer, pro-air inhaler  Patient has a tele-visit today for a follow-up visit.  Patient states that she has had increased cough that is productive of yellow sputum recently.  She states that she notices this more when eating.  She has been getting choked on solid foods and liquids during mealtimes.  She has been seen by GI on 09/28/2018 and they are following her for drug-induced constipation, GERD, and esophageal stenosis.  They are planning on doing an EGD in September to evaluate the symptoms further.  They have started her back on Protonix.  She states that she is compliant with Breo and uses Proventil as needed. Denies f/c/s, n/v/d, hemoptysis, PND, leg swelling.    Observations/Objective:  CXR 04/20/18 - No active cardiopulmonary disease.  Allergy Profile 01/10/2013-total IgE 166 with significant elevations for most common allergens except molds She had been on allergy vaccine years ago. Office Spirometry 12/13/2014-mild obstruction, mild restriction of forced vital capacity Upper endoscopy 2016-[name]'s ring and gastric erosions  Assessment  and Plan: Patient has a tele-visit today for a follow-up visit.  Patient states that she has had increased cough that is productive of yellow sputum recently.  She states that she notices this more when eating.  She has been getting choked on solid foods and liquids during mealtimes.  She has been seen by GI on 09/28/2018 and they are following her for drug-induced constipation, GERD, and esophageal stenosis.  They are planning on doing an EGD in September to evaluate the symptoms further.  They have started her back on Protonix.  She states that she is compliant with Breo and uses Proventil as needed.  Patient is requesting Robitussin to help relieve cough.  Discussed concern for possible aspiration pneumonia -will order Augmentin.  Patient Instructions  Will order Augmentin Will order robitussin Continue Breo Continue Proventil Continue proair    Follow Up Instructions:  Follow up with Dr. Annamaria Boots in 1-2 months or sooner if needed    I discussed the assessment and treatment plan with the patient. The patient was provided an opportunity to ask questions and all were answered. The patient agreed with the plan and demonstrated an understanding of the instructions.   The patient was advised to call back or seek an in-person evaluation if the symptoms worsen or if the condition fails to improve as anticipated.  I provided 22 minutes of non-face-to-face time during this encounter.   Fenton Foy, NP

## 2018-10-06 NOTE — Assessment & Plan Note (Signed)
Patient has a tele-visit today for a follow-up visit.  Patient states that she has had increased cough that is productive of yellow sputum recently.  She states that she notices this more when eating.  She has been getting choked on solid foods and liquids during mealtimes.  She has been seen by GI on 09/28/2018 and they are following her for drug-induced constipation, GERD, and esophageal stenosis.  They are planning on doing an EGD in September to evaluate the symptoms further.  They have started her back on Protonix.  She states that she is compliant with Breo and uses Proventil as needed.  Patient is requesting Robitussin to help relieve cough.  Discussed concern for possible aspiration pneumonia -will order Augmentin.  Patient Instructions  Will order Augmentin Will order robitussin Continue Breo Continue Proventil Continue proair  Follow up: Follow up with Dr. Annamaria Boots in 1-2 months or sooner if needed

## 2018-10-06 NOTE — Patient Instructions (Signed)
Will order Augmentin Will order robitussin Continue Breo Continue Proventil Continue proair  Follow up: Follow up with Dr. Annamaria Boots in 1-2 months or sooner if needed

## 2018-10-18 ENCOUNTER — Encounter: Payer: Self-pay | Admitting: Podiatry

## 2018-10-18 ENCOUNTER — Other Ambulatory Visit: Payer: Self-pay

## 2018-10-18 ENCOUNTER — Ambulatory Visit (INDEPENDENT_AMBULATORY_CARE_PROVIDER_SITE_OTHER): Payer: Medicare Other | Admitting: Podiatry

## 2018-10-18 DIAGNOSIS — E1142 Type 2 diabetes mellitus with diabetic polyneuropathy: Secondary | ICD-10-CM

## 2018-10-18 DIAGNOSIS — M79675 Pain in left toe(s): Secondary | ICD-10-CM | POA: Diagnosis not present

## 2018-10-18 DIAGNOSIS — M79674 Pain in right toe(s): Secondary | ICD-10-CM

## 2018-10-18 DIAGNOSIS — B351 Tinea unguium: Secondary | ICD-10-CM | POA: Diagnosis not present

## 2018-10-18 NOTE — Patient Instructions (Signed)
Diabetes Mellitus and Foot Care Foot care is an important part of your health, especially when you have diabetes. Diabetes may cause you to have problems because of poor blood flow (circulation) to your feet and legs, which can cause your skin to:  Become thinner and drier.  Break more easily.  Heal more slowly.  Peel and crack. You may also have nerve damage (neuropathy) in your legs and feet, causing decreased feeling in them. This means that you may not notice minor injuries to your feet that could lead to more serious problems. Noticing and addressing any potential problems early is the best way to prevent future foot problems. How to care for your feet Foot hygiene  Wash your feet daily with warm water and mild soap. Do not use hot water. Then, pat your feet and the areas between your toes until they are completely dry. Do not soak your feet as this can dry your skin.  Trim your toenails straight across. Do not dig under them or around the cuticle. File the edges of your nails with an emery board or nail file.  Apply a moisturizing lotion or petroleum jelly to the skin on your feet and to dry, brittle toenails. Use lotion that does not contain alcohol and is unscented. Do not apply lotion between your toes. Shoes and socks  Wear clean socks or stockings every day. Make sure they are not too tight. Do not wear knee-high stockings since they may decrease blood flow to your legs.  Wear shoes that fit properly and have enough cushioning. Always look in your shoes before you put them on to be sure there are no objects inside.  To break in new shoes, wear them for just a few hours a day. This prevents injuries on your feet. Wounds, scrapes, corns, and calluses  Check your feet daily for blisters, cuts, bruises, sores, and redness. If you cannot see the bottom of your feet, use a mirror or ask someone for help.  Do not cut corns or calluses or try to remove them with medicine.  If you  find a minor scrape, cut, or break in the skin on your feet, keep it and the skin around it clean and dry. You may clean these areas with mild soap and water. Do not clean the area with peroxide, alcohol, or iodine.  If you have a wound, scrape, corn, or callus on your foot, look at it several times a day to make sure it is healing and not infected. Check for: ? Redness, swelling, or pain. ? Fluid or blood. ? Warmth. ? Pus or a bad smell. General instructions  Do not cross your legs. This may decrease blood flow to your feet.  Do not use heating pads or hot water bottles on your feet. They may burn your skin. If you have lost feeling in your feet or legs, you may not know this is happening until it is too late.  Protect your feet from hot and cold by wearing shoes, such as at the beach or on hot pavement.  Schedule a complete foot exam at least once a year (annually) or more often if you have foot problems. If you have foot problems, report any cuts, sores, or bruises to your health care provider immediately. Contact a health care provider if:  You have a medical condition that increases your risk of infection and you have any cuts, sores, or bruises on your feet.  You have an injury that is not   healing.  You have redness on your legs or feet.  You feel burning or tingling in your legs or feet.  You have pain or cramps in your legs and feet.  Your legs or feet are numb.  Your feet always feel cold.  You have pain around a toenail. Get help right away if:  You have a wound, scrape, corn, or callus on your foot and: ? You have pain, swelling, or redness that gets worse. ? You have fluid or blood coming from the wound, scrape, corn, or callus. ? Your wound, scrape, corn, or callus feels warm to the touch. ? You have pus or a bad smell coming from the wound, scrape, corn, or callus. ? You have a fever. ? You have a red line going up your leg. Summary  Check your feet every day  for cuts, sores, red spots, swelling, and blisters.  Moisturize feet and legs daily.  Wear shoes that fit properly and have enough cushioning.  If you have foot problems, report any cuts, sores, or bruises to your health care provider immediately.  Schedule a complete foot exam at least once a year (annually) or more often if you have foot problems. This information is not intended to replace advice given to you by your health care provider. Make sure you discuss any questions you have with your health care provider. Document Released: 03/20/2000 Document Revised: 05/05/2017 Document Reviewed: 04/24/2016 Elsevier Patient Education  2020 [employer]   Onychomycosis/Fungal Toenails  WHAT IS IT? An infection that lies within the keratin of your nail plate that is caused by a fungus.  WHY ME? Fungal infections affect all ages, sexes, races, and creeds.  There may be many factors that predispose you to a fungal infection such as age, coexisting medical conditions such as diabetes, or an autoimmune disease; stress, medications, fatigue, genetics, etc.  Bottom line: fungus thrives in a warm, moist environment and your shoes offer such a location.  IS IT CONTAGIOUS? Theoretically, yes.  You do not want to share shoes, nail clippers or files with someone who has fungal toenails.  Walking around barefoot in the same room or sleeping in the same bed is unlikely to transfer the organism.  It is important to realize, however, that fungus can spread easily from one nail to the next on the same foot.  HOW DO WE TREAT THIS?  There are several ways to treat this condition.  Treatment may depend on many factors such as age, medications, pregnancy, liver and kidney conditions, etc.  It is best to ask your doctor which options are available to you.  1. No treatment.   Unlike many other medical concerns, you can live with this condition.  However for many people this can be a painful condition and may lead to  ingrown toenails or a bacterial infection.  It is recommended that you keep the nails cut short to help reduce the amount of fungal nail. 2. Topical treatment.  These range from herbal remedies to prescription strength nail lacquers.  About 40-50% effective, topicals require twice daily application for approximately 9 to 12 months or until an entirely new nail has grown out.  The most effective topicals are medical grade medications available through physicians offices. 3. Oral antifungal medications.  With an 80-90% cure rate, the most common oral medication requires 3 to 4 months of therapy and stays in your system for a year as the new nail grows out.  Oral antifungal medications do require   blood work to make sure it is a safe drug for you.  A liver function panel will be performed prior to starting the medication and after the first month of treatment.  It is important to have the blood work performed to avoid any harmful side effects.  In general, this medication safe but blood work is required. 4. Laser Therapy.  This treatment is performed by applying a specialized laser to the affected nail plate.  This therapy is noninvasive, fast, and non-painful.  It is not covered by insurance and is therefore, out of pocket.  The results have been very good with a 80-95% cure rate.  [facility] is the only practice in the area to offer this therapy. 5. Permanent Nail Avulsion.  Removing the entire nail so that a new nail will not grow back. 

## 2018-10-18 NOTE — Progress Notes (Signed)
Subjective: [name] presents today with history of neuropathy. Patient seen for follow up of chronic, painful mycotic toenails and callus(es)/corn(s) which interfere with daily activities and routine tasks.  Pain is aggravated when wearing enclosed shoe gear. Pain is getting progressively worse and relieved with periodic professional debridement.   Abran Richard, MD is her PCP.    Current Outpatient Medications:  .  acetaminophen (TYLENOL) 500 MG tablet, Take 500 mg by mouth every 4 (four) hours as needed., Disp: , Rfl:  .  albuterol (PROAIR HFA) 108 (90 Base) MCG/ACT inhaler, Inhale 2 puffs every 6 hours for asthma - rescue inhaler (Patient taking differently: Inhale 2 puffs into the lungs every 6 (six) hours as needed for shortness of breath. ), Disp: 1 Inhaler, Rfl: 12 .  albuterol (PROVENTIL) (2.5 MG/3ML) 0.083% nebulizer solution, Take 2.5 mg by nebulization every 6 (six) hours as needed for wheezing or shortness of breath., Disp: , Rfl:  .  alum & mag hydroxide-simeth (MAALOX/MYLANTA) 384-536-46 MG/5ML suspension, Take 30 mLs by mouth as needed for indigestion or heartburn. Max 4 doses per 24 hours, Disp: , Rfl:  .  AMBULATORY NON FORMULARY MEDICATION, [place], Spoon, and Knife  DX: G25.0, Disp: 1 Device, Rfl: 0 .  amoxicillin-clavulanate (AUGMENTIN) 875-125 MG tablet, Take 1 tablet by mouth 2 (two) times daily., Disp: 14 tablet, Rfl: 0 .  aspirin 81 MG chewable tablet, Chew 81 mg by mouth daily. , Disp: , Rfl:  .  Aspirin Buf,CaCarb-MgCarb-MgO, 81 MG TABS, Take by mouth., Disp: , Rfl:  .  baclofen (LIORESAL) 10 MG tablet, Take 10 mg by mouth at bedtime., Disp: , Rfl:  .  Carboxymethylcellulose Sod PF (REFRESH PLUS) 0.5 % SOLN, Place 1 drop into both eyes 2 (two) times daily. , Disp: , Rfl:  .  clotrimazole-betamethasone (LOTRISONE) cream, Apply 1 application topically 2 (two) times daily., Disp: , Rfl:  .  dextromethorphan (DELSYM) 30 MG/5ML liquid, Take 5 mLs (30 mg  total) by mouth 2 (two) times daily as needed for cough (Every 12 hours as needed)., Disp: 280 mL, Rfl: 5 .  divalproex (DEPAKOTE) 500 MG DR tablet, Take 500 mg by mouth 3 (three) times daily. , Disp: , Rfl:  .  docusate sodium (COLACE) 100 MG capsule, Take 1 capsule (100 mg total) by mouth daily., Disp: 30 capsule, Rfl: 0 .  doxycycline (VIBRA-TABS) 100 MG tablet, doxycycline hyclate 100 mg tablet, Disp: , Rfl:  .  ENULOSE 10 GM/15ML SOLN, , Disp: , Rfl:  .  fluticasone furoate-vilanterol (BREO ELLIPTA) 100-25 MCG/INH AEPB, Inhale 1 puff into the lungs daily., Disp: 1 each, Rfl: 11 .  gabapentin (NEURONTIN) 300 MG capsule, Take 300-600 mg by mouth See admin instructions. Take 300 mg in the morning and 600 mg at bedtime, Disp: , Rfl:  .  guaiFENesin (ROBITUSSIN) 100 MG/5ML SOLN, Take 5 mLs (100 mg total) by mouth every 4 (four) hours as needed for cough or to loosen phlegm., Disp: 236 mL, Rfl: 0 .  HYDROcodone-acetaminophen (NORCO/VICODIN) 5-325 MG tablet, Take 1 tablet by mouth 3 (three) times daily., Disp: , Rfl:  .  hydrocortisone (PROCTOCORT) 1 % CREA, Apply 1 application topically 2 (two) times daily as needed (hemorrhoid flare)., Disp: , Rfl:  .  ibuprofen (ADVIL,MOTRIN) 200 MG tablet, Take 200 mg by mouth every 6 (six) hours as needed for fever, headache or moderate pain. , Disp: , Rfl:  .  insulin glargine (LANTUS) 100 UNIT/ML injection, Inject 20 Units into the skin  at bedtime. , Disp: , Rfl:  .  ipratropium (ATROVENT) 0.03 % nasal spray, 1-2 puffs each nostril 3 times daily, before meals (Patient taking differently: Place 1 spray into both nostrils 3 (three) times daily. ), Disp: 30 mL, Rfl: 11 .  lactulose (CHRONULAC) 10 GM/15ML solution, Take 10 g by mouth daily. , Disp: , Rfl:  .  levothyroxine (SYNTHROID, LEVOTHROID) 75 MCG tablet, TAKE (1) TABLET BY MOUTH ONCE DAILY BEFORE BREAKFAST. (Patient taking differently: Take 75 mcg by mouth daily before breakfast. ), Disp: 30 tablet, Rfl: 5 .   loperamide (IMODIUM A-D) 2 MG tablet, Take 2 mg by mouth 4 (four) times daily as needed for diarrhea or loose stools. , Disp: , Rfl:  .  magnesium hydroxide (MILK OF MAGNESIA) 400 MG/5ML suspension, Take 30 mLs by mouth at bedtime as needed for mild constipation. , Disp: , Rfl:  .  Menthol, Topical Analgesic, (BIOFREEZE) 4 % GEL, Apply 1 application topically 2 (two) times daily. Apply to lower back and base of right thumb twice daily., Disp: , Rfl:  .  metoprolol succinate (TOPROL-XL) 50 MG 24 hr tablet, Take 50 mg by mouth daily. Take with or immediately following a meal., Disp: , Rfl:  .  Multiple Vitamins-Minerals (MULTIVITAMINS THER. W/MINERALS) TABS tablet, TAKE ONE TABLET BY MOUTH ONCE DAILY. (Patient taking differently: Take 1 tablet by mouth daily. ), Disp: 30 each, Rfl: 11 .  neomycin-bacitracin-polymyxin (NEOSPORIN) ointment, Apply 1 application topically as needed for wound care., Disp: , Rfl:  .  NOVOLOG FLEXPEN 100 UNIT/ML FlexPen, Inject 8 Units into the skin 3 (three) times daily before meals. , Disp: , Rfl:  .  Omega 3 1000 MG CAPS, TAKE (1) CAPSULE BY MOUTH ONCE DAILY. (Patient taking differently: Take 1,000 mg by mouth daily. ), Disp: 30 capsule, Rfl: 11 .  pantoprazole (PROTONIX) 40 MG tablet, Take 40 mg by mouth daily., Disp: , Rfl:  .  PARoxetine (PAXIL) 10 MG tablet, , Disp: , Rfl:  .  Polyethyl Glycol-Propyl Glycol (SYSTANE ULTRA) 0.4-0.3 % SOLN, Place 1 drop into both eyes 4 (four) times daily as needed (dry eyes)., Disp: , Rfl:  .  polyethylene glycol (MIRALAX / GLYCOLAX) packet, Take 17 g by mouth daily as needed for moderate constipation., Disp: 14 each, Rfl: 0 .  potassium chloride SA (K-DUR,KLOR-CON) 20 MEQ tablet, Take 20 mEq by mouth daily., Disp: , Rfl:  .  predniSONE (DELTASONE) 10 MG tablet, prednisone 10 mg tablet, Disp: , Rfl:  .  primidone (MYSOLINE) 50 MG tablet, Take 50 mg by mouth daily. , Disp: , Rfl:  .  QUEtiapine (SEROQUEL) 100 MG tablet, Take 100 mg by  mouth at bedtime., Disp: , Rfl:  .  Respiratory Therapy Supplies (FLUTTER) DEVI, Use flutter device 3 times a day, Disp: 1 each, Rfl: 0 .  simvastatin (ZOCOR) 5 MG tablet, Take 5 mg by mouth at bedtime. , Disp: , Rfl:  .  sitaGLIPtin (JANUVIA) 100 MG tablet, Take 100 mg by mouth daily., Disp: , Rfl:  .  sodium chloride (OCEAN) 0.65 % SOLN nasal spray, Take 1-2 sprays in nostril every hour as needed for rhinitis (Patient taking differently: Place 1 spray into both nostrils every hour as needed for congestion. ), Disp: 30 mL, Rfl: PRN .  sodium chloride 1 g tablet, Take 1 g by mouth 2 (two) times daily., Disp: , Rfl:  .  sucralfate (CARAFATE) 1 g tablet, Take 1 g by mouth 2 (two) times daily. , Disp: ,  Rfl:   Allergies  Allergen Reactions  . Invokana [Canagliflozin] Other (See Comments)    Yeast infectios  . Losartan Other (See Comments)    Angioedema   . Metformin And Related Diarrhea  . Latex Other (See Comments)    Other Reaction: redness, blisters  . Other Other (See Comments)    Air fresheners - on MAR  . Oxycodone-Acetaminophen Itching and Other (See Comments)    TYLOX. Caused internal itching per patient   . Mucinex [Guaifenesin Er] Other (See Comments)    MUCINEX REACTION: asthma attacks    Objective:  Vascular Examination: Capillary refill time immediate x 10 digits.  Dorsalis pedis pulses present b/l.  Posterior tibial pulses present b/l.  Digital hair sparse x 10 digits.  Skin temperature WNL b/l.  Dermatological Examination: Skin with normal turgor, texture and tone b/l.  Toenails 1-5 b/l discolored, thick, dystrophic with subungual debris and pain with palpation to nailbeds due to thickness of nails.   Musculoskeletal: Muscle strength 5/5 to all LE muscle groups.  No gross bony abnormalities b/l.  Neurological: Sensation diminished with 10 gram monofilament.  Assessment: 1. Painful onychomycosis toenails 1-5 b/l 2. NIDDM with  neuropathy   Plan: 1. Toenails 1-5 b/l were debrided in length and girth without iatrogenic bleeding. 2. Patient to continue soft, supportive shoe gear daily. 3. Patient to report any pedal injuries to medical professional immediately. 4. Follow up 3 months.  5. Patient/POA to call should there be a concern in the interim.

## 2018-11-08 ENCOUNTER — Ambulatory Visit: Payer: Medicare Other | Admitting: Nurse Practitioner

## 2018-11-28 ENCOUNTER — Other Ambulatory Visit: Payer: Self-pay

## 2018-11-28 ENCOUNTER — Emergency Department (HOSPITAL_COMMUNITY): Payer: Medicare Other

## 2018-11-28 ENCOUNTER — Emergency Department (HOSPITAL_COMMUNITY)
Admission: EM | Admit: 2018-11-28 | Discharge: 2018-11-29 | Disposition: A | Discharge status: Home / Self Care | Payer: Medicare Other | Attending: Emergency Medicine | Admitting: Emergency Medicine

## 2018-11-28 ENCOUNTER — Encounter (HOSPITAL_COMMUNITY): Payer: Self-pay

## 2018-11-28 DIAGNOSIS — E119 Type 2 diabetes mellitus without complications: Secondary | ICD-10-CM | POA: Diagnosis not present

## 2018-11-28 DIAGNOSIS — Z87891 Personal history of nicotine dependence: Secondary | ICD-10-CM | POA: Insufficient documentation

## 2018-11-28 DIAGNOSIS — Z91040 Latex allergy status: Secondary | ICD-10-CM | POA: Insufficient documentation

## 2018-11-28 DIAGNOSIS — Z794 Long term (current) use of insulin: Secondary | ICD-10-CM | POA: Insufficient documentation

## 2018-11-28 DIAGNOSIS — Z7982 Long term (current) use of aspirin: Secondary | ICD-10-CM | POA: Diagnosis not present

## 2018-11-28 DIAGNOSIS — R0602 Shortness of breath: Secondary | ICD-10-CM | POA: Diagnosis present

## 2018-11-28 DIAGNOSIS — R059 Cough, unspecified: Secondary | ICD-10-CM

## 2018-11-28 DIAGNOSIS — Z79899 Other long term (current) drug therapy: Secondary | ICD-10-CM | POA: Insufficient documentation

## 2018-11-28 DIAGNOSIS — E039 Hypothyroidism, unspecified: Secondary | ICD-10-CM | POA: Insufficient documentation

## 2018-11-28 DIAGNOSIS — R05 Cough: Secondary | ICD-10-CM | POA: Insufficient documentation

## 2018-11-28 DIAGNOSIS — I10 Essential (primary) hypertension: Secondary | ICD-10-CM | POA: Diagnosis not present

## 2018-11-28 LAB — URINALYSIS, ROUTINE W REFLEX MICROSCOPIC
Bilirubin Urine: NEGATIVE
Glucose, UA: NEGATIVE mg/dL
Hgb urine dipstick: NEGATIVE
Ketones, ur: 5 mg/dL — AB
Leukocytes,Ua: NEGATIVE
Nitrite: NEGATIVE
Protein, ur: NEGATIVE mg/dL
Specific Gravity, Urine: 1.01 (ref 1.005–1.030)
pH: 7 (ref 5.0–8.0)

## 2018-11-28 LAB — CBG MONITORING, ED
Glucose-Capillary: 160 mg/dL — ABNORMAL HIGH (ref 70–99)
Glucose-Capillary: 153 mg/dL — ABNORMAL HIGH (ref 70–99)

## 2018-11-28 NOTE — ED Notes (Signed)
X-ray at bedside

## 2018-11-28 NOTE — Discharge Instructions (Addendum)
Chest xray shows no pneumonia.  Return if worse.
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
## 2018-11-29 NOTE — ED Notes (Signed)
Pt d/c transported back to [place] by [facility].

## 2018-12-08 ENCOUNTER — Telehealth: Payer: Self-pay | Admitting: Internal Medicine

## 2018-12-08 MED ORDER — AZITHROMYCIN 250 MG PO TABS: ORAL_TABLET | ORAL | 0 refills | Status: DC

## 2018-12-08 NOTE — Telephone Encounter (Signed)
Offer Zpak    250 mg, # 6, 2 today then one daily 

## 2018-12-08 NOTE — Telephone Encounter (Signed)
Called and spoke with Deloise, pt's nurse, regarding CY's recommendations. Deloise verbalized understanding and stated this prescription needs to go to [facility]. I let her know we would be sending the prescription for pt.   Rx for Zpak 250 mg, # 6, 2 today then one daily has been sent to pharmacy. Nothing further needed at this time.

## 2018-12-08 NOTE — Telephone Encounter (Signed)
Called and spoke to [employer], nurse. She states the pt has been c/o prod cough with green mucus. [name] denies any change in her SOB, f/c/s, swelling, and the pt denied CP/tightness. [name] noticed the pt experiencing more fatigue and sleepiness lately. She is requesting abx and not an appt.   Dr. Annamaria Boots please advise. Thanks.   Allergies  Allergen Reactions  . Invokana [Canagliflozin] Other (See Comments)    Yeast infectios  . Losartan Other (See Comments)    Angioedema   . Metformin And Related Diarrhea  . Latex Other (See Comments)    Other Reaction: redness, blisters  . Other Other (See Comments)    Air fresheners - on MAR  . Oxycodone-Acetaminophen Itching and Other (See Comments)    TYLOX. Caused internal itching per patient   . Mucinex [Guaifenesin Er] Other (See Comments)    MUCINEX REACTION: asthma attacks    Current Outpatient Medications on File Prior to Visit  Medication Sig Dispense Refill  . acetaminophen (TYLENOL) 500 MG tablet Take 500 mg by mouth every 4 (four) hours as needed.    Marland Kitchen albuterol (PROAIR HFA) 108 (90 Base) MCG/ACT inhaler Inhale 2 puffs every 6 hours for asthma - rescue inhaler (Patient taking differently: Inhale 2 puffs into the lungs every 6 (six) hours as needed for shortness of breath. ) 1 Inhaler 12  . albuterol (PROVENTIL) (2.5 MG/3ML) 0.083% nebulizer solution Take 2.5 mg by nebulization every 6 (six) hours as needed for wheezing or shortness of breath.    Marland Kitchen alum & mag hydroxide-simeth (MAALOX/MYLANTA) 200-200-20 MG/5ML suspension Take 30 mLs by mouth as needed for indigestion or heartburn. Max 4 doses per 24 hours    . AMBULATORY NON FORMULARY MEDICATION [place], Spoon, and Knife  DX: G25.0 1 Device 0  . amoxicillin-clavulanate (AUGMENTIN) 875-125 MG tablet Take 1 tablet by mouth 2 (two) times daily. 14 tablet 0  . aspirin 81 MG chewable tablet Chew 81 mg by mouth daily.     . Aspirin Buf,CaCarb-MgCarb-MgO, 81 MG TABS Take by mouth.    .  baclofen (LIORESAL) 10 MG tablet Take 10 mg by mouth at bedtime.    . Carboxymethylcellulose Sod PF (REFRESH PLUS) 0.5 % SOLN Place 1 drop into both eyes 2 (two) times daily.     . clotrimazole-betamethasone (LOTRISONE) cream Apply 1 application topically 2 (two) times daily.    Marland Kitchen dextromethorphan (DELSYM) 30 MG/5ML liquid Take 5 mLs (30 mg total) by mouth 2 (two) times daily as needed for cough (Every 12 hours as needed). 280 mL 5  . divalproex (DEPAKOTE) 500 MG DR tablet Take 500 mg by mouth 3 (three) times daily.     Marland Kitchen docusate sodium (COLACE) 100 MG capsule Take 1 capsule (100 mg total) by mouth daily. 30 capsule 0  . doxycycline (VIBRA-TABS) 100 MG tablet doxycycline hyclate 100 mg tablet    . ENULOSE 10 GM/15ML SOLN     . fluticasone furoate-vilanterol (BREO ELLIPTA) 100-25 MCG/INH AEPB Inhale 1 puff into the lungs daily. 1 each 11  . gabapentin (NEURONTIN) 300 MG capsule Take 300-600 mg by mouth See admin instructions. Take 300 mg in the morning and 600 mg at bedtime    . guaiFENesin (ROBITUSSIN) 100 MG/5ML SOLN Take 5 mLs (100 mg total) by mouth every 4 (four) hours as needed for cough or to loosen phlegm. 236 mL 0  . HYDROcodone-acetaminophen (NORCO/VICODIN) 5-325 MG tablet Take 1 tablet by mouth 3 (three) times daily.    . hydrocortisone (  PROCTOCORT) 1 % CREA Apply 1 application topically 2 (two) times daily as needed (hemorrhoid flare).    Marland Kitchen ibuprofen (ADVIL,MOTRIN) 200 MG tablet Take 200 mg by mouth every 6 (six) hours as needed for fever, headache or moderate pain.     Marland Kitchen insulin glargine (LANTUS) 100 UNIT/ML injection Inject 20 Units into the skin at bedtime.     Marland Kitchen ipratropium (ATROVENT) 0.03 % nasal spray 1-2 puffs each nostril 3 times daily, before meals (Patient taking differently: Place 1 spray into both nostrils 3 (three) times daily. ) 30 mL 11  . lactulose (CHRONULAC) 10 GM/15ML solution Take 10 g by mouth daily.     Marland Kitchen levothyroxine (SYNTHROID, LEVOTHROID) 75 MCG tablet TAKE (1)  TABLET BY MOUTH ONCE DAILY BEFORE BREAKFAST. (Patient taking differently: Take 75 mcg by mouth daily before breakfast. ) 30 tablet 5  . loperamide (IMODIUM A-D) 2 MG tablet Take 2 mg by mouth 4 (four) times daily as needed for diarrhea or loose stools.     . magnesium hydroxide (MILK OF MAGNESIA) 400 MG/5ML suspension Take 30 mLs by mouth at bedtime as needed for mild constipation.     . Menthol, Topical Analgesic, (BIOFREEZE) 4 % GEL Apply 1 application topically 2 (two) times daily. Apply to lower back and base of right thumb twice daily.    . metoprolol succinate (TOPROL-XL) 50 MG 24 hr tablet Take 50 mg by mouth daily. Take with or immediately following a meal.    . Multiple Vitamins-Minerals (MULTIVITAMINS THER. W/MINERALS) TABS tablet TAKE ONE TABLET BY MOUTH ONCE DAILY. (Patient taking differently: Take 1 tablet by mouth daily. ) 30 each 11  . neomycin-bacitracin-polymyxin (NEOSPORIN) ointment Apply 1 application topically as needed for wound care.    Marland Kitchen NOVOLOG FLEXPEN 100 UNIT/ML FlexPen Inject 8 Units into the skin 3 (three) times daily before meals.     . Omega 3 1000 MG CAPS TAKE (1) CAPSULE BY MOUTH ONCE DAILY. (Patient taking differently: Take 1,000 mg by mouth daily. ) 30 capsule 11  . pantoprazole (PROTONIX) 40 MG tablet Take 40 mg by mouth daily.    Marland Kitchen PARoxetine (PAXIL) 10 MG tablet     . Polyethyl Glycol-Propyl Glycol (SYSTANE ULTRA) 0.4-0.3 % SOLN Place 1 drop into both eyes 4 (four) times daily as needed (dry eyes).    . polyethylene glycol (MIRALAX / GLYCOLAX) packet Take 17 g by mouth daily as needed for moderate constipation. 14 each 0  . potassium chloride SA (K-DUR,KLOR-CON) 20 MEQ tablet Take 20 mEq by mouth daily.    . predniSONE (DELTASONE) 10 MG tablet prednisone 10 mg tablet    . primidone (MYSOLINE) 50 MG tablet Take 50 mg by mouth daily.     . QUEtiapine (SEROQUEL) 100 MG tablet Take 100 mg by mouth at bedtime.    Marland Kitchen Respiratory Therapy Supplies (FLUTTER) DEVI Use  flutter device 3 times a day 1 each 0  . simvastatin (ZOCOR) 5 MG tablet Take 5 mg by mouth at bedtime.     . sitaGLIPtin (JANUVIA) 100 MG tablet Take 100 mg by mouth daily.    . sodium chloride (OCEAN) 0.65 % SOLN nasal spray Take 1-2 sprays in nostril every hour as needed for rhinitis (Patient taking differently: Place 1 spray into both nostrils every hour as needed for congestion. ) 30 mL PRN  . sodium chloride 1 g tablet Take 1 g by mouth 2 (two) times daily.    . sucralfate (CARAFATE) 1 g tablet Take 1  g by mouth 2 (two) times daily.      No current facility-administered medications on file prior to visit.

## 2018-12-14 ENCOUNTER — Encounter (HOSPITAL_COMMUNITY)
Admission: RE | Admit: 2018-12-14 | Discharge: 2018-12-14 | Disposition: A | Discharge status: Home / Self Care | Payer: Medicare Other | Source: Ambulatory Visit | Attending: Internal Medicine | Admitting: Internal Medicine

## 2018-12-14 ENCOUNTER — Other Ambulatory Visit: Payer: Self-pay

## 2018-12-14 DIAGNOSIS — R1310 Dysphagia, unspecified: Secondary | ICD-10-CM | POA: Diagnosis not present

## 2018-12-14 DIAGNOSIS — K222 Esophageal obstruction: Secondary | ICD-10-CM | POA: Insufficient documentation

## 2018-12-14 DIAGNOSIS — R198 Other specified symptoms and signs involving the digestive system and abdomen: Secondary | ICD-10-CM | POA: Insufficient documentation

## 2018-12-14 DIAGNOSIS — Z01812 Encounter for preprocedural laboratory examination: Secondary | ICD-10-CM | POA: Diagnosis not present

## 2018-12-14 DIAGNOSIS — K219 Gastro-esophageal reflux disease without esophagitis: Secondary | ICD-10-CM | POA: Insufficient documentation

## 2018-12-14 LAB — BASIC METABOLIC PANEL
Anion gap: 8 (ref 5–15)
BUN: 8 mg/dL (ref 8–23)
CO2: 28 mmol/L (ref 22–32)
Calcium: 8.8 mg/dL — ABNORMAL LOW (ref 8.9–10.3)
Chloride: 103 mmol/L (ref 98–111)
Creatinine, Ser: 0.65 mg/dL (ref 0.44–1.00)
GFR calc Af Amer: >60 mL/min (ref >60)
GFR calc non Af Amer: >60 mL/min (ref >60)
Glucose, Bld: 53 mg/dL — ABNORMAL LOW (ref 70–99)
Potassium: 3.4 mmol/L — ABNORMAL LOW (ref 3.5–5.1)
Sodium: 139 mmol/L (ref 135–145)

## 2018-12-14 NOTE — Patient Instructions (Signed)
[name]  12/14/2018     @PREFPERIOPPHARMACY @   Your procedure is scheduled on Monday, 12/19/18.  [facility]  Call this number if you have problems the morning of surgery:  [phone]   Remember:  Do not eat or drink after midnight.  Follow the instructions given to you by Dr. Roseanne Kaufman office.   Take these medicines the morning of surgery with A SIP OF WATER albuterol, depakote, gabapentin, noro if needed, levothyroxine, metoprolol, protonix, paxil and sucralfate, Take half of your Lantus the night before.    Do not wear jewelry, make-up or nail polish.  Do not wear lotions, powders, or perfumes, or deodorant.  Do not shave 48 hours prior to surgery.  Men may shave face and neck.  Do not bring valuables to the hospital.  [facility] is not responsible for any belongings or valuables.  Contacts, dentures or bridgework may not be worn into surgery.  Leave your suitcase in the car.  After surgery it may be brought to your room.  For patients admitted to the hospital, discharge time will be determined by your treatment team.  Patients discharged the day of surgery will not be allowed to drive home.   Name and phone number of your driver:   family  Special instructions:     Please read over the following fact sheets that you were given. Care and Recovery After Surgery   Upper Endoscopy, Adult Upper endoscopy is a procedure to look inside the upper GI (gastrointestinal) tract. The upper GI tract is made up of:  The part of the body that moves food from your mouth to your stomach (esophagus).  The stomach.  The first part of your small intestine (duodenum). This procedure is also called esophagogastroduodenoscopy (EGD) or gastroscopy. In this procedure, your health care provider passes a thin, flexible tube (endoscope) through your mouth and down your esophagus into your stomach. A small camera is attached to the end of the tube. Images from  the camera appear on a monitor in the exam room. During this procedure, your health care provider may also remove a small piece of tissue to be sent to a lab and examined under a microscope (biopsy). Your health care provider may do an upper endoscopy to diagnose cancers of the upper GI tract. You may also have this procedure to find the cause of other conditions, such as:  Stomach pain.  Heartburn.  Pain or problems when swallowing.  Nausea and vomiting.  Stomach bleeding.  Stomach ulcers. Tell a health care provider about:  Any allergies you have.  All medicines you are taking, including vitamins, herbs, eye drops, creams, and over-the-counter medicines.  Any problems you or family members have had with anesthetic medicines.  Any blood disorders you have.  Any surgeries you have had.  Any medical conditions you have.  Whether you are pregnant or may be pregnant. What are the risks? Generally, this is a safe procedure. However, problems may occur, including:  Infection.  Bleeding.  Allergic reactions to medicines.  A tear or hole (perforation) in the esophagus, stomach, or duodenum. What happens before the procedure? Staying hydrated Follow instructions from your health care provider about hydration, which may include:  Up to 2 hours before the procedure - you may continue to drink clear liquids, such as water, clear fruit juice, black coffee, and plain tea.  Eating and drinking restrictions Follow instructions from your health care provider about eating and  drinking, which may include:  8 hours before the procedure - stop eating heavy meals or foods, such as meat, fried foods, or fatty foods.  6 hours before the procedure - stop eating light meals or foods, such as toast or cereal.  6 hours before the procedure - stop drinking milk or drinks that contain milk.  2 hours before the procedure - stop drinking clear liquids. Medicines Ask your health care provider  about:  Changing or stopping your regular medicines. This is especially important if you are taking diabetes medicines or blood thinners.  Taking medicines such as aspirin and ibuprofen. These medicines can thin your blood. Do not take these medicines unless your health care provider tells you to take them.  Taking over-the-counter medicines, vitamins, herbs, and supplements. General instructions  Plan to have someone take you home from the hospital or clinic.  If you will be going home right after the procedure, plan to have someone with you for 24 hours.  Ask your health care provider what steps will be taken to help prevent infection. What happens during the procedure?   An IV will be inserted into one of your veins.  You may be given one or more of the following: ? A medicine to help you relax (sedative). ? A medicine to numb the throat (local anesthetic).  You will lie on your left side on an exam table.  Your health care provider will pass the endoscope through your mouth and down your esophagus.  Your health care provider will use the scope to check the inside of your esophagus, stomach, and duodenum. Biopsies may be taken.  The endoscope will be removed. The procedure may vary among health care providers and hospitals. What happens after the procedure?  Your blood pressure, heart rate, breathing rate, and blood oxygen level will be monitored until you leave the hospital or clinic.  Do not drive for 24 hours if you were given a sedative during your procedure.  When your throat is no longer numb, you may be given some fluids to drink.  It is up to you to get the results of your procedure. Ask your health care provider, or the department that is doing the procedure, when your results will be ready. Summary  Upper endoscopy is a procedure to look inside the upper GI tract.  During the procedure, an IV will be inserted into one of your veins. You may be given a medicine  to help you relax.  A medicine will be used to numb your throat.  The endoscope will be passed through your mouth and down your esophagus. This information is not intended to replace advice given to you by your health care provider. Make sure you discuss any questions you have with your health care provider. Document Released: 03/20/2000 Document Revised: 09/15/2017 Document Reviewed: 08/23/2017 Elsevier Patient Education  2020 [place] [employer].  Upper Endoscopy, Adult, Care After This sheet gives you information about how to care for yourself after your procedure. Your health care provider may also give you more specific instructions. If you have problems or questions, contact your health care provider. What can I expect after the procedure? After the procedure, it is common to have:  A sore throat.  Mild stomach pain or discomfort.  Bloating.  Nausea. Follow these instructions at home:   Follow instructions from your health care provider about what to eat or drink after your procedure.  Return to your normal activities as told by your health care  provider. Ask your health care provider what activities are safe for you.  Take over-the-counter and prescription medicines only as told by your health care provider.  Do not drive for 24 hours if you were given a sedative during your procedure.  Keep all follow-up visits as told by your health care provider. This is important. Contact a health care provider if you have:  A sore throat that lasts longer than one day.  Trouble swallowing. Get help right away if:  You vomit blood or your vomit looks like coffee grounds.  You have: ? A fever. ? Bloody, black, or tarry stools. ? A severe sore throat or you cannot swallow. ? Difficulty breathing. ? Severe pain in your chest or abdomen. Summary  After the procedure, it is common to have a sore throat, mild stomach discomfort, bloating, and nausea.  Do not drive for 24 hours if  you were given a sedative during the procedure.  Follow instructions from your health care provider about what to eat or drink after your procedure.  Return to your normal activities as told by your health care provider. This information is not intended to replace advice given to you by your health care provider. Make sure you discuss any questions you have with your health care provider. Document Released: 09/22/2011 Document Revised: 09/14/2017 Document Reviewed: 08/23/2017 Elsevier Patient Education  2020 [employer].

## 2018-12-19 ENCOUNTER — Ambulatory Visit (HOSPITAL_COMMUNITY): Payer: Medicare Other | Admitting: Anesthesiology

## 2018-12-19 ENCOUNTER — Encounter (HOSPITAL_COMMUNITY): Payer: Self-pay | Admitting: Anesthesiology

## 2018-12-19 ENCOUNTER — Encounter (HOSPITAL_COMMUNITY)
Admission: RE | Disposition: A | Discharge status: Nursing Home (Includes ICF) | Payer: Self-pay | Source: Home / Self Care | Attending: Internal Medicine

## 2018-12-19 ENCOUNTER — Other Ambulatory Visit: Payer: Self-pay

## 2018-12-19 ENCOUNTER — Other Ambulatory Visit (HOSPITAL_COMMUNITY)
Admission: RE | Admit: 2018-12-19 | Discharge: 2018-12-19 | Disposition: A | Discharge status: Home / Self Care | Payer: Medicare Other | Source: Ambulatory Visit | Attending: Internal Medicine | Admitting: Internal Medicine

## 2018-12-19 ENCOUNTER — Ambulatory Visit (HOSPITAL_COMMUNITY)
Admission: RE | Admit: 2018-12-19 | Discharge: 2018-12-19 | Disposition: A | Discharge status: Other Acute Inpatient Hospital | Payer: Medicare Other | Attending: Internal Medicine | Admitting: Internal Medicine

## 2018-12-19 DIAGNOSIS — Z20828 Contact with and (suspected) exposure to other viral communicable diseases: Secondary | ICD-10-CM | POA: Insufficient documentation

## 2018-12-19 DIAGNOSIS — Z79890 Hormone replacement therapy (postmenopausal): Secondary | ICD-10-CM | POA: Diagnosis not present

## 2018-12-19 DIAGNOSIS — Z888 Allergy status to other drugs, medicaments and biological substances status: Secondary | ICD-10-CM | POA: Diagnosis not present

## 2018-12-19 DIAGNOSIS — K222 Esophageal obstruction: Secondary | ICD-10-CM

## 2018-12-19 DIAGNOSIS — E039 Hypothyroidism, unspecified: Secondary | ICD-10-CM | POA: Insufficient documentation

## 2018-12-19 DIAGNOSIS — R251 Tremor, unspecified: Secondary | ICD-10-CM | POA: Insufficient documentation

## 2018-12-19 DIAGNOSIS — Z7982 Long term (current) use of aspirin: Secondary | ICD-10-CM | POA: Insufficient documentation

## 2018-12-19 DIAGNOSIS — R1310 Dysphagia, unspecified: Secondary | ICD-10-CM | POA: Diagnosis not present

## 2018-12-19 DIAGNOSIS — K3189 Other diseases of stomach and duodenum: Secondary | ICD-10-CM | POA: Insufficient documentation

## 2018-12-19 DIAGNOSIS — J449 Chronic obstructive pulmonary disease, unspecified: Secondary | ICD-10-CM | POA: Diagnosis not present

## 2018-12-19 DIAGNOSIS — F319 Bipolar disorder, unspecified: Secondary | ICD-10-CM | POA: Diagnosis not present

## 2018-12-19 DIAGNOSIS — G4730 Sleep apnea, unspecified: Secondary | ICD-10-CM | POA: Insufficient documentation

## 2018-12-19 DIAGNOSIS — Z7951 Long term (current) use of inhaled steroids: Secondary | ICD-10-CM | POA: Diagnosis not present

## 2018-12-19 DIAGNOSIS — Z794 Long term (current) use of insulin: Secondary | ICD-10-CM | POA: Diagnosis not present

## 2018-12-19 DIAGNOSIS — F419 Anxiety disorder, unspecified: Secondary | ICD-10-CM | POA: Insufficient documentation

## 2018-12-19 DIAGNOSIS — I10 Essential (primary) hypertension: Secondary | ICD-10-CM | POA: Diagnosis not present

## 2018-12-19 DIAGNOSIS — Z885 Allergy status to narcotic agent status: Secondary | ICD-10-CM | POA: Diagnosis not present

## 2018-12-19 DIAGNOSIS — E119 Type 2 diabetes mellitus without complications: Secondary | ICD-10-CM | POA: Insufficient documentation

## 2018-12-19 DIAGNOSIS — Z87891 Personal history of nicotine dependence: Secondary | ICD-10-CM | POA: Insufficient documentation

## 2018-12-19 DIAGNOSIS — Z79899 Other long term (current) drug therapy: Secondary | ICD-10-CM | POA: Insufficient documentation

## 2018-12-19 DIAGNOSIS — Z79891 Long term (current) use of opiate analgesic: Secondary | ICD-10-CM | POA: Insufficient documentation

## 2018-12-19 DIAGNOSIS — R569 Unspecified convulsions: Secondary | ICD-10-CM | POA: Insufficient documentation

## 2018-12-19 DIAGNOSIS — K219 Gastro-esophageal reflux disease without esophagitis: Secondary | ICD-10-CM | POA: Insufficient documentation

## 2018-12-19 DIAGNOSIS — M1990 Unspecified osteoarthritis, unspecified site: Secondary | ICD-10-CM | POA: Insufficient documentation

## 2018-12-19 HISTORY — PX: ESOPHAGOGASTRODUODENOSCOPY (EGD) WITH PROPOFOL: SHX5813

## 2018-12-19 HISTORY — PX: MALONEY DILATION: SHX5535

## 2018-12-19 HISTORY — PX: BIOPSY: SHX5522

## 2018-12-19 HISTORY — PX: ESOPHAGEAL DILATION: SHX303

## 2018-12-19 LAB — GLUCOSE, CAPILLARY
Glucose-Capillary: 126 mg/dL — ABNORMAL HIGH (ref 70–99)
Glucose-Capillary: 146 mg/dL — ABNORMAL HIGH (ref 70–99)

## 2018-12-19 LAB — SARS CORONAVIRUS 2 BY RT PCR (HOSPITAL ORDER, PERFORMED IN ~~LOC~~ HOSPITAL LAB): SARS Coronavirus 2: NEGATIVE

## 2018-12-19 SURGERY — ESOPHAGOGASTRODUODENOSCOPY (EGD) WITH PROPOFOL
Anesthesia: General

## 2018-12-19 MED ORDER — KETAMINE HCL 50 MG/5ML IJ SOSY
PREFILLED_SYRINGE | INTRAMUSCULAR | Status: AC
  Filled 2018-12-19: qty 5

## 2018-12-19 MED ORDER — KETAMINE HCL 10 MG/ML IJ SOLN
INTRAMUSCULAR | Status: DC | PRN
  Administered 2018-12-19: 5 mg via INTRAVENOUS
  Administered 2018-12-19: 10 mg via INTRAVENOUS

## 2018-12-19 MED ORDER — CHLORHEXIDINE GLUCONATE CLOTH 2 % EX PADS: 6.0000 | MEDICATED_PAD | Freq: Once | CUTANEOUS | Status: DC

## 2018-12-19 MED ORDER — LACTATED RINGERS IV SOLN
Freq: Once | INTRAVENOUS | Status: AC
  Administered 2018-12-19: 10:00:00 via INTRAVENOUS

## 2018-12-19 MED ORDER — PROPOFOL 500 MG/50ML IV EMUL
INTRAVENOUS | Status: DC | PRN
  Administered 2018-12-19: 150 ug/kg/min via INTRAVENOUS

## 2018-12-19 NOTE — Progress Notes (Signed)
Erythema noted to right lower leg with approximately 1+ edema, previous bandaid from rest home removed, noted small ulceration approxicmately 1cm x 1cm with "they put ointment on it" , clear opsite placed and Dr.[name] notified.

## 2018-12-19 NOTE — H&P (Signed)
@LOGO @   Primary Care Physician:  Abran Richard, MD Primary Gastroenterologist:  Dr. Daiva Nakayama  Pre-Procedure History & Physical: HPI:  [name] is a 68 y.o. female here for for further evaluation of a recurrent esophageal dysphagia.  History of GERD noncompliance with therapy.  Went back on Protonix 40 mg daily.  Reflux fairly well controlled.  Dysphagia actually improved but not totally resolved with resumption of acid suppression therapy.  Past Medical History:  Diagnosis Date  . Allergic rhinitis    uses Nasonex daily as needed  . Allergy to angiotensin receptor blockers (ARB) 01/22/2014   Angioedema  . Anxiety    takes Ativan daily as needed  . Aspiration pneumonia ([place])   . Asthma    Albuterol inhaler and Neb daily as needed  . 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 [name], residual palpitations  . Bipolar 1 disorder ([place])   . Chronic back pain    reason unknown  . Constipation   . COPD (chronic obstructive pulmonary disease) ([place])   . DDD (degenerative disc disease), lumbar   . Depression    takes Zoloft daily as well as Cymbalta  . Diabetes mellitus    Type II  . Dysphagia   . Dysrhythmia    SVT ==ablation done by Dr. Rayann Heman 2007  . Esophageal reflux    takes Zantac daily  . Frequent headaches   . History of bronchitis Dec 2014  . Hypertension    takes Losartan daily  . Hypothyroidism    takes Synthroid daily  . Joint pain   . Joint swelling    left ankle  . Low sodium syndrome   . Lumbar radiculopathy   . Seizures ([place]) 04-05-13   one time ran out of Primidone and had stopped it cold [place]  . SVT (supraventricular tachycardia) ([place])   . Tremor, essential    takes Mysoline and Neurontin daily.  . Vocal cord dysfunction   . Weakness    in left arm    Past Surgical History:  Procedure Laterality Date  . ABDOMINAL HYSTERECTOMY    . ABLATION FOR AVNRT    . APPENDECTOMY    . BARTHOLIN GLAND CYST  EXCISION     x 2  . BIOPSY  03/04/2015   Procedure: BIOPSY (Duodenal, Gastric);  Surgeon: Daneil Dolin, MD;  Location: [facility];  Service: Endoscopy;;  . BREAST SURGERY    . CESAREAN SECTION    . CHOLECYSTECTOMY    . COLONOSCOPY  01/16/2004   [id number] rectum/colon  . COLONOSCOPY WITH PROPOFOL N/A 03/04/2015   Dr.Vale Peraza- internal hemorrhoids, pancolonic diverticulosis, colonic polyp= tubular adenoma  . DEEP BRAIN STIMULATOR PLACEMENT    . DENTAL SURGERY    . ESOPHAGEAL DILATION N/A 03/04/2015   Procedure: ESOPHAGEAL DILATION WITH 54FR MALONEY DILATOR;  Surgeon: Daneil Dolin, MD;  Location: [facility];  Service: Endoscopy;  Laterality: N/A;  . ESOPHAGOGASTRODUODENOSCOPY (EGD) WITH ESOPHAGEAL DILATION  04/03/2002   [id number] ring, otherwise normal esophagus, status post dilation with 56 F/Normal stomach  . ESOPHAGOGASTRODUODENOSCOPY (EGD) WITH ESOPHAGEAL DILATION N/A 11/08/2012   Dr. Shivani Barrantes:schatzki's ring s/p dilation/hiatal hernia  . ESOPHAGOGASTRODUODENOSCOPY (EGD) WITH PROPOFOL N/A 03/04/2015   Dr.Keith Cancio- schatzki's ring, hiatal hernia, scattered erosions bx= chronic inactive gastritis  . ESOPHAGOGASTRODUODENOSCOPY (EGD) WITH PROPOFOL N/A 05/06/2017   Normal esophagus. Dilated. Small hiatal hernia. Normal duodenal bulb and second portion of duodenum.  Marland Kitchen FINGER SURGERY    . HEMORRHOID BANDING  2017   Dr.Zekiah Coen  . LEFT ANKLE LIGAMENT REPAIR     x 2  . LEFT ELBOW REPAIR    . MALONEY DILATION N/A 05/06/2017   Procedure: Venia Minks DILATION;  Surgeon: Daneil Dolin, MD;  [facility];  Service: Endoscopy;  Laterality: N/A;  . MAXIMUM ACCESS (MAS)POSTERIOR LUMBAR INTERBODY FUSION (PLIF) 1 LEVEL N/A 04/24/2014   Procedure: Lumbar four-five Maximum access Surgery posterior lumbar interbody fusion;  Surgeon: [name], MD;  Location: [facility];  Service: Neurosurgery;  Laterality: N/A;  Lumbar four-five Maximum access Surgery posterior lumbar interbody fusion  . MULTIPLE  EXTRACTIONS WITH ALVEOLOPLASTY N/A 06/18/2017   Procedure: MULTIPLE EXTRACTION;  Surgeon: Diona Browner, DDS;  Location: [facility];  Service: Oral Surgery;  Laterality: N/A;  . PARTIAL HYSTERECTOMY    . POLYPECTOMY  03/04/2015   Procedure: POLYPECTOMY (descending colon);  Surgeon: Daneil Dolin, MD;  Location: [facility];  Service: Endoscopy;;  . PULSE GENERATOR IMPLANT Bilateral 12/15/2013   Procedure: BILATERAL PULSE GENERATOR IMPLANT;  Surgeon: [name], MD;  Location: [facility];  Service: Neurosurgery;  Laterality: Bilateral;  BILATERAL  . RIGHT BREAST CYST     benign  . SUBTHALAMIC STIMULATOR BATTERY REPLACEMENT Bilateral 06/24/2018   Procedure: Bilateral implantable pulse generator battery change;  Surgeon: [name], MD;  Location: [facility];  Service: Neurosurgery;  Laterality: Bilateral;  bilateral  . SUBTHALAMIC STIMULATOR INSERTION Bilateral 12/04/2013   Procedure: Bilateral Deep brain stimulator placement;  Surgeon: [name], MD;  Location: [facility];  Service: Neurosurgery;  Laterality: Bilateral;  Bilateral Deep brain stimulator placement  . TONSILLECTOMY      Prior to Admission medications   Medication Sig Start Date End Date Taking? Authorizing Provider  acetaminophen (TYLENOL) 500 MG tablet Take 500 mg by mouth every 4 (four) hours as needed for mild pain, fever or headache.    Yes [provider]  albuterol (PROAIR HFA) 108 (90 Base) MCG/ACT inhaler Inhale 2 puffs every 6 hours for asthma - rescue inhaler Patient taking differently: Inhale 2 puffs into the lungs every 6 (six) hours as needed for shortness of breath.  05/28/17  Yes Young, Clinton D, MD  albuterol (PROVENTIL) (2.5 MG/3ML) 0.083% nebulizer solution Take 2.5 mg by nebulization every 6 (six) hours as needed for wheezing or shortness of breath.   Yes [provider]  alum & mag hydroxide-simeth (MAALOX/MYLANTA) 200-200-20 MG/5ML suspension Take 30 mLs by mouth as needed for indigestion or  heartburn. Max 4 doses per 24 hours   Yes [provider]  AMBULATORY NON [place], Spoon, and Knife  DX: G25.0 05/26/18  Yes Tat, Eustace Quail, DO  aspirin 81 MG chewable tablet Chew 81 mg by mouth daily.    Yes [provider]  baclofen (LIORESAL) 10 MG tablet Take 10 mg by mouth at bedtime.   Yes [provider]  Carboxymethylcellulose Sod PF (REFRESH PLUS) 0.5 % SOLN Place 1 drop into both eyes 2 (two) times daily.    Yes [provider]  clotrimazole-betamethasone (LOTRISONE) cream Apply 1 application topically 2 (two) times daily.   Yes [provider]  dextromethorphan (DELSYM) 30 MG/5ML liquid Take 5 mLs (30 mg total) by mouth 2 (two) times daily as needed for cough (Every 12 hours as needed). 07/25/18  Yes Young, Tarri Fuller D, MD  divalproex (DEPAKOTE) 500 MG DR tablet Take 500 mg by mouth 3 (three) times daily.    Yes [provider]  docusate  sodium (COLACE) 100 MG capsule Take 1 capsule (100 mg total) by mouth daily. 10/01/17  Yes Pollina, Gwenyth Allegra, MD  ENULOSE 10 GM/15ML SOLN Take 10 g by mouth daily as needed.  08/22/18  Yes [provider]  fluticasone furoate-vilanterol (BREO ELLIPTA) 100-25 MCG/INH AEPB Inhale 1 puff into the lungs daily. Patient taking differently: Inhale 1 puff into the lungs daily. RINSE MOUTH WITH WATER AFTER USE 12/17/16  Yes Young, Tarri Fuller D, MD  gabapentin (NEURONTIN) 300 MG capsule Take 300-600 mg by mouth See admin instructions. Take 300 mg by mouth in the morning and 600 mg at bedtime   Yes [provider]  HYDROcodone-acetaminophen (NORCO/VICODIN) 5-325 MG tablet Take 1 tablet by mouth 3 (three) times daily.   Yes [provider]  hydrocortisone (PROCTOCORT) 1 % CREA Apply 1 application topically 2 (two) times daily as needed (hemorrhoid flare).   Yes [provider]  ibuprofen (ADVIL,MOTRIN) 200 MG tablet Take 200 mg by mouth every 6 (six) hours  as needed for fever, headache or moderate pain.    Yes [provider]  insulin glargine (LANTUS) 100 UNIT/ML injection Inject 15 Units into the skin at bedtime. *Hold if blood glucose is less than 160*   Yes [provider]  ipratropium (ATROVENT) 0.03 % nasal spray 1-2 puffs each nostril 3 times daily, before meals Patient taking differently: Place 1 spray into both nostrils 3 (three) times daily before meals.  01/12/17  Yes Young, Tarri Fuller D, MD  levothyroxine (SYNTHROID, LEVOTHROID) 75 MCG tablet TAKE (1) TABLET BY MOUTH ONCE DAILY BEFORE BREAKFAST. Patient taking differently: Take 75 mcg by mouth daily before breakfast.  01/12/15  Yes Janith Lima, MD  loperamide (IMODIUM A-D) 2 MG tablet Take 2 mg by mouth 4 (four) times daily as needed for diarrhea or loose stools.    Yes [provider]  magnesium hydroxide (MILK OF MAGNESIA) 400 MG/5ML suspension Take 30 mLs by mouth at bedtime as needed for mild constipation.    Yes [provider]  metoprolol succinate (TOPROL-XL) 50 MG 24 hr tablet Take 50 mg by mouth daily. Take with or immediately following a meal.   Yes [provider]  Multiple Vitamins-Minerals (MULTIVITAMINS THER. W/MINERALS) TABS tablet TAKE ONE TABLET BY MOUTH ONCE DAILY. Patient taking differently: Take 1 tablet by mouth daily.  04/16/15  Yes Janith Lima, MD  NOVOLOG FLEXPEN 100 UNIT/ML FlexPen Inject 8 Units into the skin 3 (three) times daily before meals.  07/01/15  Yes [provider]  Omega 3 1000 MG CAPS TAKE (1) CAPSULE BY MOUTH ONCE DAILY. Patient taking differently: Take 1,000 mg by mouth daily.  04/16/15  Yes Janith Lima, MD  pantoprazole (PROTONIX) 40 MG tablet Take 40 mg by mouth daily.   Yes [provider]  PARoxetine (PAXIL) 10 MG tablet Take 10 mg by mouth daily.  10/07/18  Yes [provider]  Polyethyl Glycol-Propyl Glycol (SYSTANE ULTRA) 0.4-0.3 % SOLN Place 1 drop into both eyes 4  (four) times daily as needed (dry eyes).   Yes [provider]  polyethylene glycol (MIRALAX / GLYCOLAX) packet Take 17 g by mouth daily as needed for moderate constipation. 10/01/17  Yes Pollina, Gwenyth Allegra, MD  potassium chloride SA (K-DUR,KLOR-CON) 20 MEQ tablet Take 20 mEq by mouth daily.   Yes [provider]  primidone (MYSOLINE) 50 MG tablet Take 50 mg by mouth daily.    Yes [provider]  QUEtiapine (SEROQUEL) 100 MG  tablet Take 100 mg by mouth at bedtime.   Yes [provider]  Respiratory Therapy Supplies (FLUTTER) DEVI Use flutter device 3 times a day 01/11/18  Yes Martyn Ehrich, NP  simvastatin (ZOCOR) 5 MG tablet Take 5 mg by mouth at bedtime.  08/21/17  Yes [provider]  sitaGLIPtin (JANUVIA) 100 MG tablet Take 100 mg by mouth daily.   Yes [provider]  sodium chloride (OCEAN) 0.65 % SOLN nasal spray Take 1-2 sprays in nostril every hour as needed for rhinitis Patient taking differently: Place 1 spray into both nostrils every hour as needed for congestion.  06/08/17  Yes Young, Tarri Fuller D, MD  sodium chloride 1 g tablet Take 1 g by mouth 2 (two) times daily.   Yes [provider]  sucralfate (CARAFATE) 1 g tablet Take 1 g by mouth 2 (two) times daily. ON AN EMPTY STOMACH 04/24/16  Yes [provider]  amoxicillin-clavulanate (AUGMENTIN) 875-125 MG tablet Take 1 tablet by mouth 2 (two) times daily. Patient not taking: Reported on 12/09/2018 10/06/18   Fenton Foy, NP  azithromycin (ZITHROMAX) 250 MG tablet Take as directed. Patient taking differently: Take 250-500 mg by mouth See admin instructions. Take 500 mg by mouth on day 1, then 250 mg daily for days 2-5 12/08/18   Baird Lyons D, MD  glucose 4 GM chewable tablet Chew 5 tablets by mouth as needed for low blood sugar.    [provider]  guaiFENesin (ROBITUSSIN) 100 MG/5ML SOLN Take 5 mLs (100 mg total) by mouth every 4 (four) hours as needed  for cough or to loosen phlegm. Patient taking differently: Take 10 mLs by mouth every 4 (four) hours as needed for cough or to loosen phlegm.  10/06/18   Fenton Foy, NP  Menthol, Topical Analgesic, (BIOFREEZE) 4 % GEL Apply 1 application topically 2 (two) times daily. Apply to lower back and base of right thumb twice daily.    [provider]  neomycin-bacitracin-polymyxin (NEOSPORIN) ointment Apply 1 application topically as needed for wound care.    [provider]    Allergies as of 09/28/2018 - Review Complete 09/28/2018  Allergen Reaction Noted  . Invokana [canagliflozin] Other (See Comments) 12/05/2014  . Losartan Other (See Comments) 01/22/2014  . Metformin and related Diarrhea 01/18/2013  . Latex Other (See Comments) 07/02/2014  . Other Other (See Comments) 07/02/2014  . Oxycodone-acetaminophen Itching and Other (See Comments) 05/11/2011  . Mucinex [guaifenesin er] Other (See Comments)     Family History  Problem Relation Age of Onset  . Lung cancer Father        DIED AGE 75 LUNG CA  . Alcohol abuse Father   . Anxiety disorder Father   . Depression Father   . COPD Mother        DIED AGE 8,MRSA,COPD,PNEUMONIA  . Pneumonia Mother        DIED AGE 8,MRSA,COPD,PNEUMONIA  . Supraventricular tachycardia Mother   . Anxiety disorder Mother   . Depression Mother   . Alcohol abuse Mother   . Heart disease Sister        DIED AGE 101, SMOKER,?HEART  . COPD Brother        AGE 10  . Colon cancer Neg Hx     Social History   Socioeconomic History  . Marital status: Legally Separated    Spouse name: Not on file  . Number of children: Not on file  . Years of education: Not on  file  . Highest education level: Bachelor's degree (e.g., BA, AB, BS)  Occupational History  . Occupation: retired  Scientific laboratory technician  . Financial resource strain: Not on file  . Food insecurity    Worry: Not on file    Inability: Not on file  . Transportation needs    Medical: Not  on file    Non-medical: Not on file  Tobacco Use  . Smoking status: Former Smoker    Packs/day: 0.30    Years: 10.00    Pack years: 3.00    Types: Cigarettes    Quit date: 04/23/1993    Years since quitting: 25.6  . Smokeless tobacco: Never Used  . Tobacco comment: quit smoking 25+yrs ago  Substance and Sexual Activity  . Alcohol use: No    Alcohol/week: 0.0 standard drinks  . Drug use: No  . Sexual activity: Not Currently  Lifestyle  . Physical activity    Days per week: Not on file    Minutes per session: Not on file  . Stress: Not on file  Relationships  . Social Herbalist on phone: Not on file    Gets together: Not on file    Attends religious service: Not on file    Active member of club or organization: Not on file    Attends meetings of clubs or organizations: Not on file    Relationship status: Not on file  . Intimate partner violence    Fear of current or ex partner: Not on file    Emotionally abused: Not on file    Physically abused: Not on file    Forced sexual activity: Not on file  Other Topics Concern  . Not on file  Social History Narrative   Married, 1 daughte, living.  Lives with husband in [place], [place].   1 child died brain tumor at age 36.   Two grandchildren.   Works at [employer], patient currently lost her job.   Retired 2011.   No tobacco.   Alcohol: none in 30 yrs.  Distant history of heavy use.   No drug use.    Review of Systems: See HPI, otherwise negative ROS  Physical Exam: BP (!) 88/55   Pulse 72   Temp 98.4 F (36.9 C) (Oral)   Resp 18   SpO2 90%  General:   Alert,  Well-developed, well-nourished, pleasant and cooperative in NAD Neck:  Supple; no masses or thyromegaly. No significant cervical adenopathy. Lungs:  Clear throughout to auscultation.   No wheezes, crackles, or rhonchi. No acute distress. Heart:  Regular rate and rhythm; no murmurs, clicks, rubs,  or gallops. Abdomen: Non-distended, normal bowel  sounds.  Soft and nontender without appreciable mass or hepatosplenomegaly.  Pulses:  Normal pulses noted. Extremities:  Without clubbing or edema.  Impression/Plan: 69 year old lady with longstanding GERD now with recurrent esophageal dysphagia improved with resumption of acid suppression therapy.  EGD with dilation as feasible/appropriate today per plan.  The risks, benefits, limitations, alternatives and imponderables have been reviewed with the patient. Potential for esophageal dilation, biopsy, etc. have also been reviewed.  Questions have been answered. All parties agreeable.     Notice: This dictation was prepared with Dragon dictation along with smaller phrase technology. Any transcriptional errors that result from this process are unintentional and may not be corrected upon review.

## 2018-12-19 NOTE — Anesthesia Postprocedure Evaluation (Signed)
Anesthesia Post Note  Patient: [name]  Procedure(s) Performed: ESOPHAGOGASTRODUODENOSCOPY (EGD) WITH PROPOFOL (N/A ) MALONEY DILATION (N/A ) ESOPHAGEAL DILATION BIOPSY  Patient location during evaluation: PACU Anesthesia Type: MAC Level of consciousness: awake Pain management: pain level controlled Vital Signs Assessment: post-procedure vital signs reviewed and stable Respiratory status: spontaneous breathing Cardiovascular status: stable Postop Assessment: no apparent nausea or vomiting Anesthetic complications: no     Last Vitals:  Vitals:   12/19/18 1005 12/19/18 1023  BP: (!) 88/55   Pulse:  72  Resp: 18   Temp: 36.9 C   SpO2: 90%     Last Pain:  Vitals:   12/19/18 1050  TempSrc:   PainSc: 0-No pain                 Everette Rank

## 2018-12-19 NOTE — Addendum Note (Signed)
Addendum  created 12/19/18 1130 by Georgeanne Nim, CRNA   Intraprocedure Event edited

## 2018-12-19 NOTE — Discharge Instructions (Signed)
Gastroesophageal Reflux Disease, Adult Gastroesophageal reflux (GER) happens when acid from the stomach flows up into the tube that connects the mouth and the stomach (esophagus). Normally, food travels down the esophagus and stays in the stomach to be digested. However, when a person has GER, food and stomach acid sometimes move back up into the esophagus. If this becomes a more serious problem, the person may be diagnosed with a disease called gastroesophageal reflux disease (GERD). GERD occurs when the reflux:  Happens often.  Causes frequent or severe symptoms.  Causes problems such as damage to the esophagus. When stomach acid comes in contact with the esophagus, the acid may cause soreness (inflammation) in the esophagus. Over time, GERD may create small holes (ulcers) in the lining of the esophagus. What are the causes? This condition is caused by a problem with the muscle between the esophagus and the stomach (lower esophageal sphincter, or LES). Normally, the LES muscle closes after food passes through the esophagus to the stomach. When the LES is weakened or abnormal, it does not close properly, and that allows food and stomach acid to go back up into the esophagus. The LES can be weakened by certain dietary substances, medicines, and medical conditions, including:  Tobacco use.  Pregnancy.  Having a hiatal hernia.  Alcohol use.  Certain foods and beverages, such as coffee, chocolate, onions, and peppermint. What increases the risk? You are more likely to develop this condition if you:  Have an increased body weight.  Have a connective tissue disorder.  Use NSAID medicines. What are the signs or symptoms? Symptoms of this condition include:  Heartburn.  Difficult or painful swallowing.  The feeling of having a lump in the throat.  Abitter taste in the mouth.  Bad breath.  Having a large amount of saliva.  Having an upset or bloated  stomach.  Belching.  Chest pain. Different conditions can cause chest pain. Make sure you see your health care provider if you experience chest pain.  Shortness of breath or wheezing.  Ongoing (chronic) cough or a night-time cough.  Wearing away of tooth enamel.  Weight loss. How is this diagnosed? Your health care provider will take a medical history and perform a physical exam. To determine if you have mild or severe GERD, your health care provider may also monitor how you respond to treatment. You may also have tests, including:  A test to examine your stomach and esophagus with a small camera (endoscopy).  A test thatmeasures the acidity level in your esophagus.  A test thatmeasures how much pressure is on your esophagus.  A barium swallow or modified barium swallow test to show the shape, size, and functioning of your esophagus. How is this treated? The goal of treatment is to help relieve your symptoms and to prevent complications. Treatment for this condition may vary depending on how severe your symptoms are. Your health care provider may recommend:  Changes to your diet.  Medicine.  Surgery. Follow these instructions at home: Eating and drinking   Follow a diet as recommended by your health care provider. This may involve avoiding foods and drinks such as: ? Coffee and tea (with or without caffeine). ? Drinks that containalcohol. ? Energy drinks and sports drinks. ? Carbonated drinks or sodas. ? Chocolate and cocoa. ? Peppermint and mint flavorings. ? Garlic and onions. ? Horseradish. ? Spicy and acidic foods, including peppers, chili powder, curry powder, vinegar, hot sauces, and barbecue sauce. ? Citrus fruit juices and citrus  fruits, such as oranges, lemons, and limes. ? Tomato-based foods, such as red sauce, chili, salsa, and pizza with red sauce. ? Fried and fatty foods, such as donuts, french fries, potato chips, and high-fat dressings. ? High-fat  meats, such as hot dogs and fatty cuts of red and white meats, such as rib eye steak, sausage, ham, and bacon. ? High-fat dairy items, such as whole milk, butter, and cream cheese.  Eat small, frequent meals instead of large meals.  Avoid drinking large amounts of liquid with your meals.  Avoid eating meals during the 2-3 hours before bedtime.  Avoid lying down right after you eat.  Do not exercise right after you eat. Lifestyle   Do not use any products that contain nicotine or tobacco, such as cigarettes, e-cigarettes, and chewing tobacco. If you need help quitting, ask your health care provider.  Try to reduce your stress by using methods such as yoga or meditation. If you need help reducing stress, ask your health care provider.  If you are overweight, reduce your weight to an amount that is healthy for you. Ask your health care provider for guidance about a safe weight loss goal. General instructions  Pay attention to any changes in your symptoms.  Take over-the-counter and prescription medicines only as told by your health care provider. Do not take aspirin, ibuprofen, or other NSAIDs unless your health care provider told you to do so.  Wear loose-fitting clothing. Do not wear anything tight around your waist that causes pressure on your abdomen.  Raise (elevate) the head of your bed about 6 inches (15 cm).  Avoid bending over if this makes your symptoms worse.  Keep all follow-up visits as told by your health care provider. This is important. Contact a health care provider if:  You have: ? New symptoms. ? Unexplained weight loss. ? Difficulty swallowing or it hurts to swallow. ? Wheezing or a persistent cough. ? A hoarse voice.  Your symptoms do not improve with treatment. Get help right away if you:  Have pain in your arms, neck, jaw, teeth, or back.  Feel sweaty, dizzy, or light-headed.  Have chest pain or shortness of breath.  Vomit and your vomit looks  like blood or coffee grounds.  Faint.  Have stool that is bloody or black.  Cannot swallow, drink, or eat. Summary  Gastroesophageal reflux happens when acid from the stomach flows up into the esophagus. GERD is a disease in which the reflux happens often, causes frequent or severe symptoms, or causes problems such as damage to the esophagus.  Treatment for this condition may vary depending on how severe your symptoms are. Your health care provider may recommend diet and lifestyle changes, medicine, or surgery.  Contact a health care provider if you have new or worsening symptoms.  Take over-the-counter and prescription medicines only as told by your health care provider. Do not take aspirin, ibuprofen, or other NSAIDs unless your health care provider told you to do so.  Keep all follow-up visits as told by your health care provider. This is important. This information is not intended to replace advice given to you by your health care provider. Make sure you discuss any questions you have with your health care provider. Document Released: 12/31/2004 Document Revised: 09/29/2017 Document Reviewed: 09/29/2017 Elsevier Patient Education  [place]. EGD Discharge instructions Please read the instructions outlined below and refer to this sheet in the next few weeks. These discharge instructions provide you with general information  on caring for yourself after you leave the hospital. Your doctor may also give you specific instructions. While your treatment has been planned according to the most current medical practices available, unavoidable complications occasionally occur. If you have any problems or questions after discharge, please call your doctor. ACTIVITY  You may resume your regular activity but move at a slower pace for the next 24 hours.   Take frequent rest periods for the next 24 hours.   Walking will help expel (get rid of) the air and reduce the bloated feeling in your  abdomen.   No driving for 24 hours (because of the anesthesia (medicine) used during the test).   You may shower.   Do not sign any important legal documents or operate any machinery for 24 hours (because of the anesthesia used during the test).  NUTRITION  Drink plenty of fluids.   You may resume your normal diet.   Begin with a light meal and progress to your normal diet.   Avoid alcoholic beverages for 24 hours or as instructed by your caregiver.  MEDICATIONS  You may resume your normal medications unless your caregiver tells you otherwise.  WHAT YOU CAN EXPECT TODAY  You may experience abdominal discomfort such as a feeling of fullness or gas pains.  FOLLOW-UP  Your doctor will discuss the results of your test with you.  SEEK IMMEDIATE MEDICAL ATTENTION IF ANY OF THE FOLLOWING OCCUR:  Excessive nausea (feeling sick to your stomach) and/or vomiting.   Severe abdominal pain and distention (swelling).   Trouble swallowing.   Temperature over 101 F (37.8 C).   Rectal bleeding or vomiting of blood.    Check with your primary care doctor regarding the appropriate dose of azithromycin  GERD information provided  Continue Protonix 40 mg every day.  Further recommendations to follow pending review of pathology report  Office visit with [place] in 1 year  Patient has what looks like cellulitis right lower extremity.  This needs to be addressed by primary care physician if not already done

## 2018-12-19 NOTE — Transfer of Care (Signed)
Immediate Anesthesia Transfer of Care Note  Patient: [name]  Procedure(s) Performed: ESOPHAGOGASTRODUODENOSCOPY (EGD) WITH PROPOFOL (N/A ) MALONEY DILATION (N/A ) ESOPHAGEAL DILATION BIOPSY  Patient Location: PACU  Anesthesia Type:MAC  Level of Consciousness: awake and patient cooperative  Airway & Oxygen Therapy: Patient Spontanous Breathing  Post-op Assessment: Report given to RN and Post -op Vital signs reviewed and stable  Post vital signs: Reviewed and stable  Last Vitals:  Vitals Value Taken Time  BP 120/72 12/19/18 1116  Temp    Pulse 75 12/19/18 1118  Resp 20 12/19/18 1118  SpO2 94 % 12/19/18 1118  Vitals shown include unvalidated device data.  Last Pain:  Vitals:   12/19/18 1050  TempSrc:   PainSc: 0-No pain         Complications: No apparent anesthesia complications

## 2018-12-19 NOTE — Anesthesia Preprocedure Evaluation (Addendum)
Anesthesia Evaluation  Patient identified by MRN, date of birth, ID band Patient awake    Reviewed: Allergy & Precautions, NPO status , Patient's Chart, lab work & pertinent test results, reviewed documented beta blocker date and time   Airway Mallampati: III  TM Distance: >3 FB Neck ROM: Full    Dental  (+) Edentulous Upper,    Pulmonary asthma , sleep apnea , pneumonia, COPD, former smoker,    Pulmonary exam normal        Cardiovascular Exercise Tolerance: Poor hypertension (took metoprolol this morning), Pt. on home beta blockers and Pt. 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   Neuromuscular disease    GI/Hepatic GERD  Poorly Controlled and Medicated,  Endo/Other  diabetes, Well Controlled, Type 2, Insulin DependentHypothyroidism   Renal/GU      Musculoskeletal  (+) Arthritis ,   Abdominal   Peds  Hematology   Anesthesia Other Findings   Reproductive/Obstetrics                             Anesthesia Physical Anesthesia Plan  ASA: IV  Anesthesia Plan: General   Post-op Pain Management:    Induction: Intravenous  PONV Risk Score and Plan:   Airway Management Planned: Natural Airway, Nasal Cannula and Simple Face Mask  Additional Equipment:   Intra-op Plan:   Post-operative Plan:   Informed Consent: I have reviewed the patients History and Physical, chart, labs and discussed the procedure including the risks, benefits and alternatives for the proposed anesthesia with the patient or authorized representative who has indicated his/her understanding and acceptance.       Plan Discussed with: CRNA  Anesthesia Plan Comments:         Anesthesia Quick Evaluation

## 2018-12-21 ENCOUNTER — Other Ambulatory Visit: Payer: Self-pay

## 2018-12-21 ENCOUNTER — Encounter: Payer: Self-pay | Admitting: Internal Medicine

## 2018-12-21 ENCOUNTER — Ambulatory Visit (INDEPENDENT_AMBULATORY_CARE_PROVIDER_SITE_OTHER): Payer: Medicare Other | Admitting: Nurse Practitioner

## 2018-12-21 ENCOUNTER — Encounter: Payer: Self-pay | Admitting: Nurse Practitioner

## 2018-12-21 VITALS — BP 134/82 | HR 83 | Temp 97.0°F | Ht 64.5 in | Wt 193.6 lb

## 2018-12-21 DIAGNOSIS — K5903 Drug induced constipation: Secondary | ICD-10-CM

## 2018-12-21 DIAGNOSIS — K219 Gastro-esophageal reflux disease without esophagitis: Secondary | ICD-10-CM

## 2018-12-21 DIAGNOSIS — R1310 Dysphagia, unspecified: Secondary | ICD-10-CM | POA: Diagnosis not present

## 2018-12-21 NOTE — Assessment & Plan Note (Signed)
Solid food dysphagia status post EGD with dilation and improved PPI compliance.  Symptoms have significantly improved.  She has had one episode of dysphagia since her procedure which was on dry potato chips.  She did not keep fluids on hand.  Reviewed dysphagia prevention interventions including slow eating, deliberate chewing, soft foods, keeping fluids on hand to help.  Recommend continued PPI.  Follow-up in 6 months.

## 2018-12-21 NOTE — Assessment & Plan Note (Signed)
History of chronic constipation currently well managed on MiraLAX.  Recommend she continue her current medications and follow-up in 6 months.

## 2018-12-21 NOTE — Assessment & Plan Note (Signed)
GERD symptoms significantly improved on PPI compliance.  Recommend she continue her PPI follow-up in 6 months.

## 2018-12-21 NOTE — Patient Instructions (Signed)
Your health issues we discussed today were:   GERD (reflux/heartburn) with dysphagia (swallowing difficulties): 1. Continue taking Protonix (your acid blocker) every day 2. As we discussed, obtaining your dentures will help with your chewing 3. Consume soft foods, chew adequately, keep fluids on hand while eating in case something gets "stuck". 4. Call us if you have any worsening or severe symptoms  Constipation: 1. Continue using MiraLAX to help with constipation 2. Call us if you have any severe worsening symptoms  Overall I recommend:  1. Continue your other current medications 2. Follow-up in 6 months 3. Call us if you have any questions or concerns.   Because of recent events of COVID-19 ("Coronavirus"), follow CDC recommendations:  1. Wash your hand frequently 2. Avoid touching your face 3. Stay away from people who are sick 4. If you have symptoms such as fever, cough, shortness of breath then call your healthcare provider for further guidance 5. If you are sick, STAY AT HOME unless otherwise directed by your healthcare provider. 6. Follow directions from state and national officials regarding staying safe   At [facility] we value your feedback. You may receive a survey about your visit today. Please share your experience as we strive to create trusting relationships with our patients to provide genuine, compassionate, quality care.  We appreciate your understanding and patience as we review any laboratory studies, imaging, and other diagnostic tests that are ordered as we care for you. Our office policy is 5 business days for review of these results, and any emergent or urgent results are addressed in a timely manner for your best interest. If you do not hear from our office in 1 week, please contact us.   We also encourage the use of MyChart, which contains your medical information for your review as well. If you are not enrolled in this feature, an access  code is on this after visit summary for your convenience. Thank you for allowing [place] to be involved in your care.  It was great to see you today!  I hope you have a great Fall!!

## 2018-12-21 NOTE — Op Note (Signed)
[facility] Patient Name: [name] Procedure Date: 12/19/2018 11:10 AM MRN: [id number] Date of Birth: 1950-04-18 Attending MD: Norvel Richards , MD CSN: [id number] Age: 68 Admit Type: Outpatient Procedure:                Upper GI endoscopy Indications:              Dysphagia Providers:                Norvel Richards, MD Referring MD:              Medicines:                Propofol per Anesthesia Complications:            No immediate complications. Estimated Blood Loss:     Estimated blood loss was minimal. Estimated blood                            loss was minimal. Procedure:                Pre-Anesthesia Assessment:                           - Prior to the procedure, a History and Physical                            was performed, and patient medications and                            allergies were reviewed. The patient's tolerance of                            previous anesthesia was also reviewed. The risks                            and benefits of the procedure and the sedation                            options and risks were discussed with the patient.                            All questions were answered, and informed consent                            was obtained. Prior Anticoagulants: The patient has                            taken no previous anticoagulant or antiplatelet                            agents. ASA Grade Assessment: II - A patient with                            mild systemic disease. After reviewing the risks  and benefits, the patient was deemed in                            satisfactory condition to undergo the procedure.                           After obtaining informed consent, the endoscope was                            passed under direct vision. Throughout the                            procedure, the patient's blood pressure, pulse, and                            oxygen saturations were monitored  continuously. The                            upper GI endoscopy was accomplished without                            difficulty. The patient tolerated the procedure                            well. The [id number]-[id number] scope was introduced                            through the and advanced to the second part of                            duodenum. Findings:      A mild [name] ring was found at the gastroesophageal junction.      The exam was otherwise without abnormality.      Multiple dispersed erosions were found in the entire examined stomach. .      The duodenal bulb and second portion of the duodenum were normal. The       scope was withdrawn. Dilation was performed with a Maloney dilator with       mild resistance at 56 Fr. The dilation site was examined following       endoscope reinsertion and showed moderate improvement in luminal       narrowing. Estimated blood loss was minimal. Finally, stomach was       biopsied with a cold forceps for histology. Estimated blood loss was       minimal Impression:               - Mild [name] ring. Dilated.                           - The examination was otherwise normal.                           - Erosive gastropathy. Biopsied.                           - Normal duodenal bulb and second portion  of the                            duodenum. Moderate Sedation:      Moderate (conscious) sedation was personally administered by an       anesthesia professional. The following parameters were monitored: oxygen       saturation, heart rate, blood pressure, respiratory rate, EKG, adequacy       of pulmonary ventilation, and response to care. Recommendation:           - Patient has a contact number available for                            emergencies. The signs and symptoms of potential                            delayed complications were discussed with the                            patient. Return to normal activities tomorrow.                             Written discharge instructions were provided to the                            patient.                           - Resume previous diet.                           - Continue present medications. Continue Protonix                            40 mg daily. Appears to have a cellulitis right                            lower extremity. Recommend follow-up with PCP for                            further evaluation.                           - Await pathology results.                           - - Return to my office in 1 year. Procedure Code(s):        --- Professional ---                           [phone], Esophagogastroduodenoscopy, flexible,                            transoral; with biopsy, single or multiple                           43450, Dilation of esophagus, by  unguided sound or                            bougie, single or multiple passes Diagnosis Code(s):        --- Professional ---                           K22.2, Esophageal obstruction                           K31.89, Other diseases of stomach and duodenum                           R13.10, Dysphagia, unspecified CPT copyright 2019 [employer]. All rights reserved. The codes documented in this report are preliminary and upon coder review may  be revised to meet current compliance requirements. Cristopher Estimable. Ramar Nobrega, MD Norvel Richards, MD 12/21/2018 2:54:43 PM This report has been signed electronically. Number of Addenda: 0

## 2018-12-21 NOTE — Progress Notes (Signed)
Referring Provider: Abran Richard, MD Primary Care Physician:  Abran Richard, MD Primary GI:  Dr. Gala Romney  Chief Complaint  Patient presents with   Gastroesophageal Reflux    EGD done 9/14   Constipation    relates to her pain medications    HPI:   [name] is a 68 y.o. female who presents for follow-up on GERD and constipation.  Patient was last seen in our office 09/28/2022 GERD, dysphagia, umbilical pain.  History of drug-induced constipation, GERD, esophageal stenosis.  Her last visit noted 2 months of solid food dysphagia typically with meats and frequent regurgitation.  Worsening symptoms if she is rushed to eat.  Abdominal pain for 2 weeks which started posttussive with a bad cough.  Admits to having "bad reflux" about 4 times a week and not getting Protonix daily before breakfast.  Patient with nausea but no vomiting which typically self resolves.  Postprandial diarrhea while on antibiotic but this is resolved.  Bellybutton soreness and something round came out for which a med tech told her to clean with a Q-tip and water.  Ibuprofen as needed for pain.  Recommended EGD, resume Protonix 40 mg daily, triple antibiotic ointment to bellybutton, follow-up in 3 months.  EGD with dilation completed 12/19/2018 and on the day of noted reflux well controlled on PPI dysphagia improved but not totally resolved with resumption of acid suppression therapy. Surgical pathology of gastric biopsies found chronic gastritis without histopathological changes.   Today she states she's doing well. She states the procedure went well. Has had a decreased appetite. GERD significantly improved on PPI. Has only had one recurrence of dysphagia last night with potato chips. She did not drink anything afterward and just "coughed them up." Has some difficulty chewing because she doesn't have any upper teeth currently, but has already had the impressions made and should be getting dentures soon. Denies  abdominal pain, N/V, hematochezia, melena, fever, chills, unintentional weight loss. Takes MiraLAX for constipation which works well for her. Denies URI or flu-like symptoms. Denies loss of sense of taste or smell. Has her temperature checked every day due to a wound on her leg; pending referral to wound care center in [place]. Denies chest pain, dyspnea, dizziness, lightheadedness, syncope, near syncope. Denies any other upper or lower GI symptoms.  TCS up to date, next due 2021. She is on recall.  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 [name], residual palpitations   Bipolar 1 disorder ([place])    Chronic back pain    reason unknown   Constipation    COPD (chronic obstructive pulmonary disease) ([place])    DDD (degenerative disc disease), lumbar    Depression    takes Zoloft daily as well as Cymbalta   Diabetes mellitus    Type II   Dysphagia    Dysrhythmia    SVT ==ablation done by Dr. Rayann Heman 2007   Esophageal reflux    takes Zantac daily   Frequent headaches    History of bronchitis Dec 2014   Hypertension    takes Losartan daily  Hypothyroidism    takes Synthroid daily   Joint pain    Joint swelling    left ankle   Low sodium syndrome    Lumbar radiculopathy    Seizures ([place]) 04-05-13   one time ran out of Primidone and had stopped it cold [place]   SVT (supraventricular tachycardia) ([name])    Tremor, essential    takes Mysoline and Neurontin daily.   Vocal cord dysfunction    Weakness    in left arm    Past Surgical History:  Procedure Laterality Date   ABDOMINAL HYSTERECTOMY     ABLATION FOR  AVNRT     APPENDECTOMY     BARTHOLIN GLAND CYST EXCISION     x 2   BIOPSY  03/04/2015   Procedure: BIOPSY (Duodenal, Gastric);  Surgeon: Daneil Dolin, MD;  Location: [facility];  Service: Endoscopy;;   BREAST SURGERY     CESAREAN SECTION     CHOLECYSTECTOMY     COLONOSCOPY  01/16/2004   [id number] rectum/colon   COLONOSCOPY WITH PROPOFOL N/A 03/04/2015   Dr.[name]- internal hemorrhoids, pancolonic diverticulosis, colonic polyp= tubular adenoma   DEEP BRAIN STIMULATOR PLACEMENT     DENTAL SURGERY     ESOPHAGEAL DILATION N/A 03/04/2015   Procedure: ESOPHAGEAL DILATION WITH 54FR MALONEY DILATOR;  Surgeon: Daneil Dolin, MD;  Location: [facility];  Service: Endoscopy;  Laterality: N/A;   ESOPHAGOGASTRODUODENOSCOPY (EGD) WITH ESOPHAGEAL DILATION  04/03/2002   [id number] ring, otherwise normal esophagus, status post dilation with 56 F/Normal stomach   ESOPHAGOGASTRODUODENOSCOPY (EGD) WITH ESOPHAGEAL DILATION N/A 11/08/2012   Dr. [name]:schatzki's ring s/p dilation/hiatal hernia   ESOPHAGOGASTRODUODENOSCOPY (EGD) WITH PROPOFOL N/A 03/04/2015   Dr.[name]- schatzki's ring, hiatal hernia, scattered erosions bx= chronic inactive gastritis   ESOPHAGOGASTRODUODENOSCOPY (EGD) WITH PROPOFOL N/A 05/06/2017   Normal esophagus. Dilated. Small hiatal hernia. Normal duodenal bulb and second portion of duodenum.   FINGER SURGERY     HEMORRHOID BANDING  2017   Dr.[name]   LEFT ANKLE LIGAMENT REPAIR     x 2   LEFT ELBOW REPAIR     MALONEY DILATION N/A 05/06/2017   Procedure: Venia Minks DILATION;  Surgeon: Daneil Dolin, MD;  [facility];  Service: Endoscopy;  Laterality: N/A;   MAXIMUM ACCESS (MAS)POSTERIOR LUMBAR INTERBODY FUSION (PLIF) 1 LEVEL N/A 04/24/2014   Procedure: Lumbar four-five Maximum access Surgery posterior lumbar interbody fusion;  Surgeon: [name], MD;  Location: [facility];  Service: Neurosurgery;  Laterality: N/A;  Lumbar four-five Maximum access Surgery  posterior lumbar interbody fusion   MULTIPLE EXTRACTIONS WITH ALVEOLOPLASTY N/A 06/18/2017   Procedure: MULTIPLE EXTRACTION;  Surgeon: Diona Browner, DDS;  Location: [facility];  Service: Oral Surgery;  Laterality: N/A;   PARTIAL HYSTERECTOMY     POLYPECTOMY  03/04/2015   Procedure: POLYPECTOMY (descending colon);  Surgeon: Daneil Dolin, MD;  Location: [facility];  Service: Endoscopy;;   PULSE GENERATOR IMPLANT Bilateral 12/15/2013   Procedure: BILATERAL PULSE GENERATOR IMPLANT;  Surgeon: [name], MD;  Location: [facility];  Service: Neurosurgery;  Laterality: Bilateral;  BILATERAL   RIGHT BREAST CYST     benign   SUBTHALAMIC STIMULATOR BATTERY REPLACEMENT Bilateral 06/24/2018   Procedure: Bilateral implantable pulse generator battery change;  Surgeon: [name], MD;  Location: [facility];  Service: Neurosurgery;  Laterality: Bilateral;  bilateral   SUBTHALAMIC STIMULATOR INSERTION Bilateral 12/04/2013   Procedure: Bilateral Deep brain stimulator placement;  Surgeon: [name], MD;  Location: [facility];  Service:  Neurosurgery;  Laterality: Bilateral;  Bilateral Deep brain stimulator placement   TONSILLECTOMY      Current Outpatient Medications  Medication Sig Dispense Refill   acetaminophen (TYLENOL) 500 MG tablet Take 500 mg by mouth every 4 (four) hours as needed for mild pain, fever or headache.      albuterol (PROAIR HFA) 108 (90 Base) MCG/ACT inhaler Inhale 2 puffs every 6 hours for asthma - rescue inhaler (Patient taking differently: Inhale 2 puffs into the lungs every 6 (six) hours as needed for shortness of breath. ) 1 Inhaler 12   albuterol (PROVENTIL) (2.5 MG/3ML) 0.083% nebulizer solution Take 2.5 mg by nebulization every 6 (six) hours as needed for wheezing or shortness of breath.     alum & mag hydroxide-simeth (MAALOX/MYLANTA) 200-200-20 MG/5ML suspension Take 30 mLs by mouth as needed for indigestion or heartburn. Max 4 doses per 24 hours     aspirin 81 MG chewable  tablet Chew 81 mg by mouth daily.      baclofen (LIORESAL) 10 MG tablet Take 10 mg by mouth at bedtime.     Carboxymethylcellulose Sod PF (REFRESH PLUS) 0.5 % SOLN Place 1 drop into both eyes 2 (two) times daily.      clotrimazole-betamethasone (LOTRISONE) cream Apply 1 application topically 2 (two) times daily.     dextromethorphan (DELSYM) 30 MG/5ML liquid Take 5 mLs (30 mg total) by mouth 2 (two) times daily as needed for cough (Every 12 hours as needed). 280 mL 5   divalproex (DEPAKOTE) 500 MG DR tablet Take 500 mg by mouth 3 (three) times daily.      docusate sodium (COLACE) 100 MG capsule Take 1 capsule (100 mg total) by mouth daily. 30 capsule 0   ENULOSE 10 GM/15ML SOLN Take 10 g by mouth daily as needed.      fluticasone furoate-vilanterol (BREO ELLIPTA) 100-25 MCG/INH AEPB Inhale 1 puff into the lungs daily. (Patient taking differently: Inhale 1 puff into the lungs daily. RINSE MOUTH WITH WATER AFTER USE) 1 each 11   gabapentin (NEURONTIN) 300 MG capsule Take 300-600 mg by mouth See admin instructions. Take 300 mg by mouth in the morning and 600 mg at bedtime     glucose 4 GM chewable tablet Chew 5 tablets by mouth as needed for low blood sugar.     guaiFENesin (ROBITUSSIN) 100 MG/5ML SOLN Take 5 mLs (100 mg total) by mouth every 4 (four) hours as needed for cough or to loosen phlegm. (Patient taking differently: Take 10 mLs by mouth every 6 (six) hours as needed for cough or to loosen phlegm. ) 236 mL 0   HYDROcodone-acetaminophen (NORCO/VICODIN) 5-325 MG tablet Take 1 tablet by mouth 3 (three) times daily.     hydrocortisone (PROCTOCORT) 1 % CREA Apply 1 application topically 2 (two) times daily as needed (hemorrhoid flare).     ibuprofen (ADVIL,MOTRIN) 200 MG tablet Take 200 mg by mouth every 6 (six) hours as needed for fever, headache or moderate pain.      insulin glargine (LANTUS) 100 UNIT/ML injection Inject 15 Units into the skin at bedtime. *Hold if blood glucose is  less than 160*     ipratropium (ATROVENT) 0.03 % nasal spray 1-2 puffs each nostril 3 times daily, before meals (Patient taking differently: Place 1 spray into both nostrils 3 (three) times daily before meals. ) 30 mL 11   levothyroxine (SYNTHROID, LEVOTHROID) 75 MCG tablet TAKE (1) TABLET BY MOUTH ONCE DAILY BEFORE BREAKFAST. (Patient taking differently: Take 75  mcg by mouth daily before breakfast. ) 30 tablet 5   loperamide (IMODIUM A-D) 2 MG tablet Take 2 mg by mouth 4 (four) times daily as needed for diarrhea or loose stools.      magnesium hydroxide (MILK OF MAGNESIA) 400 MG/5ML suspension Take 30 mLs by mouth at bedtime as needed for mild constipation.      Menthol, Topical Analgesic, (BIOFREEZE) 4 % GEL Apply 1 application topically 2 (two) times daily. Apply to lower back and base of right thumb twice daily.     metoprolol succinate (TOPROL-XL) 50 MG 24 hr tablet Take 50 mg by mouth daily. Take with or immediately following a meal.     Multiple Vitamins-Minerals (MULTIVITAMINS THER. W/MINERALS) TABS tablet TAKE ONE TABLET BY MOUTH ONCE DAILY. (Patient taking differently: Take 1 tablet by mouth daily. ) 30 each 11   neomycin-bacitracin-polymyxin (NEOSPORIN) ointment Apply 1 application topically as needed for wound care.     NOVOLOG FLEXPEN 100 UNIT/ML FlexPen Inject 8 Units into the skin 3 (three) times daily before meals.      Omega 3 1000 MG CAPS TAKE (1) CAPSULE BY MOUTH ONCE DAILY. (Patient taking differently: Take 1,000 mg by mouth daily. ) 30 capsule 11   pantoprazole (PROTONIX) 40 MG tablet Take 40 mg by mouth daily.     PARoxetine (PAXIL) 10 MG tablet Take 10 mg by mouth daily.      Polyethyl Glycol-Propyl Glycol (SYSTANE ULTRA) 0.4-0.3 % SOLN Place 1 drop into both eyes 4 (four) times daily as needed (dry eyes).     polyethylene glycol (MIRALAX / GLYCOLAX) packet Take 17 g by mouth daily as needed for moderate constipation. 14 each 0   potassium chloride SA  (K-DUR,KLOR-CON) 20 MEQ tablet Take 20 mEq by mouth daily.     primidone (MYSOLINE) 50 MG tablet Take 50 mg by mouth daily.      QUEtiapine (SEROQUEL) 100 MG tablet Take 100 mg by mouth at bedtime.     Respiratory Therapy Supplies (FLUTTER) DEVI Use flutter device 3 times a day 1 each 0   simvastatin (ZOCOR) 5 MG tablet Take 5 mg by mouth at bedtime.      sitaGLIPtin (JANUVIA) 100 MG tablet Take 100 mg by mouth daily.     sodium chloride (OCEAN) 0.65 % SOLN nasal spray Take 1-2 sprays in nostril every hour as needed for rhinitis (Patient taking differently: Place 1 spray into both nostrils every hour as needed for congestion. ) 30 mL PRN   sodium chloride 1 g tablet Take 1 g by mouth 2 (two) times daily.     sucralfate (CARAFATE) 1 g tablet Take 1 g by mouth 2 (two) times daily. ON AN EMPTY STOMACH     No current facility-administered medications for this visit.     Allergies as of 12/21/2018 - Review Complete 12/21/2018  Allergen Reaction Noted   Invokana [canagliflozin] Other (See Comments) 12/05/2014   Losartan Other (See Comments) 01/22/2014   Metformin and related Diarrhea 01/18/2013   Latex Other (See Comments) 07/02/2014   Other Other (See Comments) 07/02/2014   Oxycodone-acetaminophen Itching and Other (See Comments) 05/11/2011   Mucinex [guaifenesin er] Other (See Comments)     Family History  Problem Relation Age of Onset   Lung cancer Father        DIED AGE 61 LUNG CA   Alcohol abuse Father    Anxiety disorder Father    Depression Father    COPD Mother  DIED AGE 52,MRSA,COPD,PNEUMONIA   Pneumonia Mother        DIED AGE 52,MRSA,COPD,PNEUMONIA   Supraventricular tachycardia Mother    Anxiety disorder Mother    Depression Mother    Alcohol abuse Mother    Heart disease Sister        DIED AGE 50, SMOKER,?HEART   COPD Brother        AGE 38   Colon cancer Neg Hx     Social History   Socioeconomic History   Marital status:  Legally Separated    Spouse name: Not on file   Number of children: Not on file   Years of education: Not on file   Highest education level: Bachelor's degree (e.g., BA, AB, BS)  Occupational History   Occupation: retired  Scientist, product/process development strain: Not on file   Food insecurity    Worry: Not on file    Inability: Not on Lexicographer needs    Medical: Not on file    Non-medical: Not on file  Tobacco Use   Smoking status: Former Smoker    Packs/day: 0.30    Years: 10.00    Pack years: 3.00    Types: Cigarettes    Quit date: 04/23/1993    Years since quitting: 25.6   Smokeless tobacco: Never Used   Tobacco comment: quit smoking 25+yrs ago  Substance and Sexual Activity   Alcohol use: No    Alcohol/week: 0.0 standard drinks   Drug use: No   Sexual activity: Not Currently  Lifestyle   Physical activity    Days per week: Not on file    Minutes per session: Not on file   Stress: Not on file  Relationships   Social connections    Talks on phone: Not on file    Gets together: Not on file    Attends religious service: Not on file    Active member of club or organization: Not on file    Attends meetings of clubs or organizations: Not on file    Relationship status: Not on file  Other Topics Concern   Not on file  Social History Narrative   Married, 1 daughte, living.  Lives with husband in [place], [place].   1 child died brain tumor at age 39.   Two grandchildren.   Works at [employer], patient currently lost her job.   Retired 2011.   No tobacco.   Alcohol: none in 30 yrs.  Distant history of heavy use.   No drug use.    Review of Systems: General: Negative for anorexia, weight loss, fever, chills, fatigue, weakness. ENT: Negative for hoarseness, difficulty swallowing. CV: Negative for chest pain, angina, palpitations, peripheral edema.  Respiratory: Negative for dyspnea at rest, cough, sputum, wheezing.  GI: See  history of present illness. Endo: Negative for unusual weight change.  Heme: Negative for bruising or bleeding. Allergy: Negative for rash or hives.   Physical Exam: BP 134/82    Pulse 83    Temp (!) 97 F (36.1 C) (Oral)    Ht 5' 4.5" (1.638 m)    Wt 193 lb 9.6 oz (87.8 kg)    BMI 32.72 kg/m  General:   Alert and oriented. Pleasant and cooperative. Well-nourished and well-developed. Sitting in a wheelchair. Eyes:  Without icterus, sclera clear and conjunctiva pink.  Ears:  Normal auditory acuity. Cardiovascular:  S1, S2 present without murmurs appreciated. Extremities without clubbing or edema. Respiratory:  Clear to auscultation  bilaterally. No wheezes, rales, or rhonchi. No distress.  Gastrointestinal:  +BS, soft, non-tender and non-distended. No HSM noted. No guarding or rebound. No masses appreciated.  Rectal:  Deferred  Musculoskalatal:  Symmetrical without gross deformities. Skin:  Intact without significant lesions or rashes. Neurologic:  Alert and oriented x4;  Slow, deliberate speech (at baseline) consistent with history; otherwise grossly normal neurologically. Psych:  Alert and cooperative. Normal mood and affect. Heme/Lymph/Immune: No excessive bruising noted.    12/21/2018 11:25 AM   Disclaimer: This note was dictated with voice recognition software. Similar sounding words can inadvertently be transcribed and may not be corrected upon review.

## 2018-12-25 NOTE — Progress Notes (Signed)
CC'ED TO PCP 

## 2018-12-30 ENCOUNTER — Encounter (HOSPITAL_COMMUNITY): Payer: Self-pay | Admitting: Internal Medicine

## 2019-01-06 ENCOUNTER — Other Ambulatory Visit (HOSPITAL_COMMUNITY)
Admission: RE | Admit: 2019-01-06 | Discharge: 2019-01-06 | Disposition: A | Discharge status: Home / Self Care | Payer: Medicare Other | Source: Other Acute Inpatient Hospital | Attending: Internal Medicine | Admitting: Internal Medicine

## 2019-01-06 ENCOUNTER — Encounter (HOSPITAL_BASED_OUTPATIENT_CLINIC_OR_DEPARTMENT_OTHER): Payer: Medicare Other | Admitting: Internal Medicine

## 2019-01-06 ENCOUNTER — Other Ambulatory Visit: Payer: Self-pay

## 2019-01-06 DIAGNOSIS — L97811 Non-pressure chronic ulcer of other part of right lower leg limited to breakdown of skin: Secondary | ICD-10-CM | POA: Diagnosis not present

## 2019-01-06 DIAGNOSIS — E11622 Type 2 diabetes mellitus with other skin ulcer: Secondary | ICD-10-CM | POA: Diagnosis not present

## 2019-01-06 DIAGNOSIS — E1140 Type 2 diabetes mellitus with diabetic neuropathy, unspecified: Secondary | ICD-10-CM | POA: Insufficient documentation

## 2019-01-06 DIAGNOSIS — L97812 Non-pressure chronic ulcer of other part of right lower leg with fat layer exposed: Secondary | ICD-10-CM | POA: Diagnosis present

## 2019-01-06 DIAGNOSIS — J449 Chronic obstructive pulmonary disease, unspecified: Secondary | ICD-10-CM | POA: Insufficient documentation

## 2019-01-06 DIAGNOSIS — I10 Essential (primary) hypertension: Secondary | ICD-10-CM | POA: Insufficient documentation

## 2019-01-06 DIAGNOSIS — G4730 Sleep apnea, unspecified: Secondary | ICD-10-CM | POA: Diagnosis not present

## 2019-01-09 LAB — AEROBIC CULTURE W GRAM STAIN (SUPERFICIAL SPECIMEN): Culture: NO GROWTH

## 2019-01-09 NOTE — Progress Notes (Signed)
TANICE, CONDOR ([id number]) Visit Report for 01/06/2019 HPI Details Patient Name: Date of Service: [name], [name] 01/06/2019 2:45 PM Medical Record [id number] Patient Account Number: [id number] Date of Birth/Sex: Treating RN: [birth year] (68 y.o. Elam Dutch Primary Care Provider: Abran Richard Other Clinician: Referring Provider: Treating Provider/Extender:Robson, Kandice Hams, Karolee Ohs in Treatment: 0 History of Present Illness HPI Description: ADMISSION 02/14/2018 Mrs. Goodly is a 68 year old woman who lives in an assisted living in [place]. She is a type II diabetic on insulin and a remote smoker. She tells [place] she had a wound on the right lateral ankle for up the past month. Not totally clear how this was discovered. She has been using some form of "white-cream" on this. She normally sleeps on her right side and keeps the right leg on a pillow. ABI on the right was 1.18 in this clinic clinic. The patient has a history of asthma, severe essential tremor status post placement of a brain stimulator, gastroesophageal reflux disease, osteoarthritis, supraventricular tachycardia status post attempts at ablation and a lumbar fusion. 03/02/18 on evaluation today patient actually appears to be doing excellent at this point in regard to her ankle ulcer. It seems to be showing signs of measuring just a little bit smaller and overall is cleaning up compared to previous as far as the slough on the surface of the wound is concerned. With that being said she does not appear to have any evidence of infection at this time which is good news. No fevers, chills, nausea, or vomiting noted at this time. 03/10/2018; right lateral malleolus ulcer. Using Iodoflex. She lives in an assisted living in [place] and has home health. 03/17/2018; right lateral malleolus ulcer. Using Iodoflex. She has home health and lives in assisted living in [place]. The wound  dimensions are not any better however the wound surface looks a lot better. Still requiring debridement. We will run puraply through insurance. 03/24/2018 right lateral malleolus ulcer. Presumably a pressure ulcer. We have been using Iodoflex. We have been able to get down to a healthy looking wound surface this is a deep worrisome wound clearly down to muscle layer with close proximity to bone. She has a co-pay with pure apply however she has Medicaid as a secondary. We will go ahead and order this for next time we use Prisma today. She does not appear to have an arterial issue 04/01/2018; right lateral malleolus ulcer. Presumably a pressure ulcer. The depth of this is not come in at all this week. Wound bed does not look too bad but it is precariously close to bone. Puraply #1 04/08/2018; right lateral malleolus ulcer. Presumably a pressure ulcer. Surface of the wound looks better although not much improvement in depth. Still precariously close to the underlying lateral malleolus itself. Puraply #2 04/15/2018; perhaps some improvement in the wound area but certainly not the volume. This is a deep wound on the right lateral malleolus. Puraply #3 not much improvement so far. 04/22/2018.; I really do not see much improvement here. Still a deep probing area. We had some initial improvement but I am not seeing much in the last month. The patient has a very robust dorsalis pedis and a palpable popliteal but I do not feel a posterior tibial pulse. There is generally no surrounding erythema. We have used 3 Puraply's but I do not see much improvement here. 04/29/18; small orifice but still a deep probing area. Puraply #5 applied. She did not get either x-ray or arterial  studies done as apparently the order did not make it to any pending radiology. I believe they actually sent the note that I sent to the assisted living where she lives 1/31; small orifice but still deep tunneling. Base of the wound looks  better. Puraply #6. X-ray was negative for osteomyelitis. Culture I did last time was also negative 05/18/18 on evaluation today patient actually appears to be doing well in regard to her lateral ankle ulcer on the right. Fortunately there's no sign of infection at this time. She's been tolerating the dressing changes without complication. We have been utilizing PuraPly Matrix. 2/20; right lateral malleolus. We have been using puraply. I have not seen this in a few weeks and actually this looks quite a bit better. 2/27; right lateral malleolus. We have been using puraply although we did not have one to apply today we actually used endoform. ARTERIAL STUDIES; ABI on the right at 1.03 triphasic waveforms. TBI at 1.20 both of which were normal. Study on the left was also fairly good no evidence of significant left lower extremity arterial disease 3/3; patient was brought back for reapplication of puraply. This is now a very small wound in terms of orifice but with 0.4 cm of depth and some undermining. This is not something that would be easy reopen. 3/13 still 0.4 cm in depth but without much undermining. Puraply #9 today 3/23; absolutely no improvement. Still 0.4 cm in depth with not much change in the overall appearance of this deep punched out area. I did not reapplied puraply #10 substituting endoform. 3/31; not a lot of improvement here. She still has depth with undermining. I have been using endoform on this. Visibly the wound is smaller with healthy tissue however there is not much granulation filling in the wound here. 4/9; really no improvement. There is still above 0.4 cm of depth with some undermining. The superficial surface area of the wound has gotten smaller but not the depth or undermining. I been using endoform. She had a full course of Puraply. The recent PCR culture I did last week only showed a small amount of Corynebacterium which is unlikely to be a significant pathogen 4/16;  small wound. 0.5 cm in depth. Too small to really determine the undermining but I think there probably is some. I have been using Iodoflex since last week. 4/30; she has an unmanageable co-pay for Grafix PL. 0.7 cm in depth absolutely no improvement. Still using Iodoflex. I am not certain whether the home health people are packing this gently into the wound 5/14; surprisingly the patient arrives in with a very small open area remaining on the right lateral malleolus. She is using Iodoflex we are offloading this with felt 5/28; the patient's depth is 0.4 cm which is probably a little more than last time at which time it looks surprisingly shallow. We have been using Iodoflex for about a month, I went back to endoform today. She claims she is offloading this religiously 6/11; wound is smaller but still with roughly 0.2 cm of depth. Endoform started last visit which seems to be helping. 6/25; the wound is closed over. Eschar removed there is no open wound. This was presumably a pressure ulcer at one point with considerable depth albeit a small wound. This took on a more nightly long period of time to get to fill- in. Ultimately it seemed to respond best to endoform. She did have a course of puraply READMISSION 01/06/2019 This patient is a type II diabetic  who we cared for in this clinic with a very difficult to close area over her right lateral malleolus. We eventually got this to close in June she wears support stockings. She comes back in after sustaining a fall 2 weeks ago. She tells me she scraped her leg anteriorly in the mid tibia. There is some surrounding blisters. They have been using Neosporin. There is some erythema and tenderness around the wound area. She has not been in hospital there is been no medical change. She is not felt to have an arterial issue last ABI in our clinic was 1.03 Electronic Signature(s) Signed: 01/06/2019 6:19:43 PM By: Linton Ham MD Entered By: Linton Ham on 01/06/2019 17:07:02 -------------------------------------------------------------------------------- Physical Exam Details Patient Name: Date of Service: [name], [name] 01/06/2019 2:45 PM Medical Record [id number] Patient Account Number: [id number] Date of Birth/Sex: Treating RN: 06-23-1950 (68 y.o. Elam Dutch Primary Care Provider: Abran Richard Other Clinician: Referring Provider: Treating Provider/Extender:Robson, Kandice Hams, Karolee Ohs in Treatment: 0 Constitutional Sitting or standing Blood Pressure is within target range for patient.. Pulse regular and within target range for patient.Marland Kitchen Respirations regular, non-labored and within target range.. Temperature is normal and within the target range for the patient.Marland Kitchen Appears in no distress. Eyes Conjunctivae clear. No discharge.no icterus. Respiratory work of breathing is normal. Bilateral breath sounds are clear and equal in all lobes with no wheezes, rales or rhonchi.. Cardiovascular Heart rhythm and rate regular, without murmur or gallop.. Edema present in the right leg. Tenderness around the wound but no clear calf tenderness.. Integumentary (Hair, Skin) No primary cutaneous issue although there is swelling in the lower leg on the right. Psychiatric appears at normal baseline. Notes Wound exam; the areas in the right mid tibia small superficial area. There is 2 blisters just laterally to this area and the area is boggy and tender. Electronic Signature(s) Signed: 01/06/2019 6:19:43 PM By: Linton Ham MD Entered By: Linton Ham on 01/06/2019 17:08:39 -------------------------------------------------------------------------------- Physician Orders Details Patient Name: Date of Service: ALLEX, KASSON 01/06/2019 2:45 PM Medical Record [id number] Patient Account Number: [id number] Date of Birth/Sex: Treating RN: June 26, 1950 (68 y.o. Elam Dutch Primary Care Provider:  Abran Richard Other Clinician: Referring Provider: Treating Provider/Extender:Robson, Kandice Hams, Karolee Ohs in Treatment: 0 Verbal / Phone Orders: No Diagnosis Coding Follow-up Appointments Return Appointment in 1 week. Dressing Change Frequency Other: - 2 times per week, once by home health, Friday at wound center Skin Barriers/Peri-Wound Care TCA Cream or Ointment - mixed with lotion to leg Wound Cleansing Clean wound with Wound Cleanser May shower with protection. Primary Wound Dressing Wound #3 Right,Anterior Lower Leg Calcium Alginate with Silver Secondary Dressing Wound #3 Right,Anterior Lower Leg Dry Gauze Edema Control 3 Layer Compression System - Right Lower Extremity Avoid standing for long periods of time Elevate legs to the level of the heart or above for 30 minutes daily and/or when sitting, a frequency of: Exercise regularly [place] [facility] for wound care Laboratory Bacteria identified in Unspecified specimen by Anaerobe culture (MICRO) - right lower leg LOINC Code: 635-3 Convenience Name: Anerobic culture Patient Medications Allergies: losartan, Mucinex, Invokana, metformin, latex, oxycodone Notifications Medication Indication Start End Keflex infection 01/06/2019 DOSE 1 - oral 500 mg capsule - 1 capsule oral 3 times per day Electronic Signature(s) Signed: 01/06/2019 7:05:19 PM By: Baruch Gouty RN, BSN Signed: 01/09/2019 6:11:18 PM By: Linton Ham MD Previous Signature: 01/06/2019 6:19:43 PM Version By: Linton Ham MD Entered By:  Baruch Gouty on 01/06/2019 18:37:25 -------------------------------------------------------------------------------- Prescription 01/06/2019 Patient Name: IMONI, CHAMPOUX. Provider: Linton Ham MD Date of Birth: December 05, 1950 NPI#: [id number] Sex: F DEA#: [id number] Phone #: [phone] License #: [id number] Patient Address: [place] [address], [place] [address] [place], [place] [address] [phone] Allergies losartan Reaction: angioedema Severity: Severe Mucinex Reaction: asthma attack Severity: Severe Invokana Reaction: yeast infection metformin Reaction: diarrhea latex Reaction: rash, blisters oxycodone Reaction: itching Medication Medication: Route: Strength: Form: Keflex 500 mg capsule oral 500 mg capsule Class: CEPHALOSPORIN ANTIBIOTICS - 1ST GENERATION Dose: Frequency / Time: Indication: 1 1 capsule oral 3 times per day infection Number of Refills: Number of Units: 0 Twenty One (21) Capsule(s) Generic Substitution: Start Date: End Date: One Time Use: Substitution Permitted [id number] No Note to Pharmacy: Signature(s): Date(s): Electronic Signature(s) Signed: 01/06/2019 7:05:19 PM By: Baruch Gouty RN, BSN Signed: 01/09/2019 6:11:18 PM By: Linton Ham MD Previous Signature: 01/06/2019 6:19:43 PM Version By: Linton Ham MD Entered By: Baruch Gouty on 01/06/2019 18:37:25 --------------------------------------------------------------------------------  Problem List Details Patient Name: Date of Service: [name], [name] 01/06/2019 2:45 PM Medical Record [id number] Patient Account Number: [id number] Date of Birth/Sex: Treating RN: July 17, 1950 (68 y.o. 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 Elam Dutch Primary Care Provider: Abran Richard Other Clinician: Referring Provider: Treating Provider/Extender:Robson, Kandice Hams, Karolee Ohs in Treatment: 0 Subjective History of Present Illness (HPI) ADMISSION 02/14/2018 Mrs. Amaral is a 68 year old woman who lives in an assisted living in [place]. She is a type II diabetic on insulin and a remote smoker. She tells [place] she had a wound on the right lateral ankle for up the past month. Not totally clear how this was discovered. She has been using some form of "white-cream" on this. She normally sleeps on her right side and keeps the right leg on a pillow. ABI on the right was 1.18 in this clinic clinic. The patient has a history of asthma, severe essential tremor status post placement of a brain stimulator, gastroesophageal reflux disease, osteoarthritis, supraventricular tachycardia status post attempts at ablation and a lumbar fusion. 03/02/18 on evaluation today patient actually appears to be doing excellent at this point in regard to her ankle ulcer. It seems to be showing signs of measuring just a little bit smaller and overall is cleaning up compared to previous as far as the slough on the surface of the wound is concerned. With that being said she does not appear to have any evidence of infection at this time which is good news. No fevers, chills, nausea, or vomiting noted at this time. 03/10/2018; right lateral malleolus ulcer. Using Iodoflex. She lives in  an assisted living in [place] and has home  health. 03/17/2018; right lateral malleolus ulcer. Using Iodoflex. She has home health and lives in assisted living in [place]. The wound dimensions are not any better however the wound surface looks a lot better. Still requiring debridement. We will run puraply through insurance. 03/24/2018 right lateral malleolus ulcer. Presumably a pressure ulcer. We have been using Iodoflex. We have been able to get down to a healthy looking wound surface this is a deep worrisome wound clearly down to muscle layer with close proximity to bone. She has a co-pay with pure apply however she has Medicaid as a secondary. We will go ahead and order this for next time we use Prisma today. She does not appear to have an arterial issue 04/01/2018; right lateral malleolus ulcer. Presumably a pressure ulcer. The depth of this is not come in at all this week. Wound bed does not look too bad but it is precariously close to bone. Puraply #1 04/08/2018; right lateral malleolus ulcer. Presumably a pressure ulcer. Surface of the wound looks better although not much improvement in depth. Still precariously close to the underlying lateral malleolus itself. Puraply #2 04/15/2018; perhaps some improvement in the wound area but certainly not the volume. This is a deep wound on the right lateral malleolus. Puraply #3 not much improvement so far. 04/22/2018.; I really do not see much improvement here. Still a deep probing area. We had some initial improvement but I am not seeing much in the last month. The patient has a very robust dorsalis pedis and a palpable popliteal but I do not feel a posterior tibial pulse. There is generally no surrounding erythema. We have used 3 Puraply's but I do not see much improvement here. 04/29/18; small orifice but still a deep probing area. Puraply #5 applied. She did not get either x-ray or arterial studies done as apparently the order did not  make it to any pending radiology. I believe they actually sent the note that I sent to the assisted living where she lives 1/31; small orifice but still deep tunneling. Base of the wound looks better. Puraply #6. X-ray was negative for osteomyelitis. Culture I did last time was also negative 05/18/18 on evaluation today patient actually appears to be doing well in regard to her lateral ankle ulcer on the right. Fortunately there's no sign of infection at this time. She's been tolerating the dressing changes without complication. We have been utilizing PuraPly Matrix. 2/20; right lateral malleolus. We have been using puraply. I have not seen this in a few weeks and actually this looks quite a bit better. 2/27; right lateral malleolus. We have been using puraply although we did not have one to apply today we actually used endoform. ARTERIAL STUDIES; ABI on the right at 1.03 triphasic waveforms. TBI at 1.20 both of which were normal. Study on the left was also fairly good no evidence of significant left lower extremity arterial disease 3/3; patient was brought back for reapplication of puraply. This is now a very small wound in terms of orifice but with 0.4 cm of depth and some undermining. This is not something that would be easy reopen. 3/13 still 0.4 cm in depth but without much undermining. Puraply #9 today 3/23; absolutely no improvement. Still 0.4 cm in depth with not much change in the overall appearance of this deep punched out area. I did not reapplied puraply #10 substituting endoform. 3/31; not a lot of improvement here. She still has depth with undermining. I have been using endoform on  this. Visibly the wound is smaller with healthy tissue however there is not much granulation filling in the wound here. 4/9; really no improvement. There is still above 0.4 cm of depth with some undermining. The superficial surface area of the wound has gotten smaller but not the depth or undermining. I  been using endoform. She had a full course of Puraply. The recent PCR culture I did last week only showed a small amount of Corynebacterium which is unlikely to be a significant pathogen 4/16; small wound. 0.5 cm in depth. Too small to really determine the undermining but I think there probably is some. I have been using Iodoflex since last week. 4/30; she has an unmanageable co-pay for Grafix PL. 0.7 cm in depth absolutely no improvement. Still using Iodoflex. I am not certain whether the home health people are packing this gently into the wound 5/14; surprisingly the patient arrives in with a very small open area remaining on the right lateral malleolus. She is using Iodoflex we are offloading this with felt 5/28; the patient's depth is 0.4 cm which is probably a little more than last time at which time it looks surprisingly shallow. We have been using Iodoflex for about a month, I went back to endoform today. She claims she is offloading this religiously 6/11; wound is smaller but still with roughly 0.2 cm of depth. Endoform started last visit which seems to be helping. 6/25; the wound is closed over. Eschar removed there is no open wound. This was presumably a pressure ulcer at one point with considerable depth albeit a small wound. This took on a more nightly long period of time to get to fill- in. Ultimately it seemed to respond best to endoform. She did have a course of puraply READMISSION 01/06/2019 This patient is a type II diabetic who we cared for in this clinic with a very difficult to close area over her right lateral malleolus. We eventually got this to close in June she wears support stockings. She comes back in after sustaining a fall 2 weeks ago. She tells me she scraped her leg anteriorly in the mid tibia. There is some surrounding blisters. They have been using Neosporin. There is some erythema and tenderness around the wound area. She has not been in hospital there is been no  medical change. She is not felt to have an arterial issue last ABI in our clinic was 1.03 Patient History Information obtained from Patient. Allergies Invokana (Reaction: yeast infection), losartan (Severity: Severe, Reaction: angioedema), metformin (Reaction: diarrhea), latex (Reaction: rash, blisters), oxycodone (Reaction: itching), Mucinex (Severity: Severe, Reaction: asthma attack) Family History Cancer - Father, Diabetes - Mother,Father,Siblings, Heart Disease - Mother, Lung Disease - Father, Thyroid Problems - Mother, No family history of Hereditary Spherocytosis, Hypertension, Kidney Disease, Seizures, Stroke, Tuberculosis. Social History Former smoker - quit >20 yrs ago, Marital Status - Divorced, Alcohol Use - Never, Drug Use - No History, Caffeine Use - Daily - coffee. Medical History Eyes Denies history of Cataracts, Glaucoma, Optic Neuritis Ear/Nose/Mouth/Throat Denies history of Chronic sinus problems/congestion, Middle ear problems Hematologic/Lymphatic Denies history of Anemia, Hemophilia, Human Immunodeficiency Virus, Lymphedema, Sickle Cell Disease Respiratory Patient has history of Asthma, Chronic Obstructive Pulmonary Disease (COPD), Sleep Apnea Denies history of Aspiration, Pneumothorax, Tuberculosis Cardiovascular Patient has history of Arrhythmia - AV nodal reentry tachycardia, Hypertension Denies history of Angina, Congestive Heart Failure, Coronary Artery Disease, Deep Vein Thrombosis, Hypotension, Myocardial Infarction, Peripheral Arterial Disease, Peripheral Venous Disease, Phlebitis, Vasculitis Gastrointestinal Denies history of Cirrhosis ,  Colitis, Crohnoos, Hepatitis A, Hepatitis B, Hepatitis C Endocrine Patient has history of Type II Diabetes Denies history of Type I Diabetes Genitourinary Denies history of End Stage Renal Disease Immunological Denies history of Lupus Erythematosus, Raynaudoos, Scleroderma Integumentary (Skin) Denies history of  History of Burn Musculoskeletal Patient has history of Osteoarthritis Denies history of Gout, Rheumatoid Arthritis, Osteomyelitis Neurologic Patient has history of Neuropathy, Seizure Disorder Denies history of Dementia, Quadriplegia, Paraplegia Oncologic Denies history of Received Chemotherapy, Received Radiation Psychiatric Denies history of Anorexia/bulimia, Confinement Anxiety Hospitalization/Surgery History - lumbar fusion. - left arm fx with hardware. - brain stimulator implanted. - battery replaced on brain stimulator. Medical And Surgical History Notes Constitutional Symptoms (General Health) obesity Ear/Nose/Mouth/Throat allergic rhinitis, vocal cord dysfunction, dysphagia Cardiovascular HX SVT with ablation Gastrointestinal GERD Endocrine hypothyroid Neurologic essential tremors Psychiatric Bipolar disorder, depression hx suicide attempts in past x2 Review of Systems (ROS) Constitutional Symptoms (General Health) Denies complaints or symptoms of Fatigue, Fever, Chills, Marked Weight Change. Eyes Complains or has symptoms of Glasses / Contacts - glasses. Ear/Nose/Mouth/Throat Denies complaints or symptoms of Chronic sinus problems or rhinitis. Cardiovascular Denies complaints or symptoms of Chest pain. Genitourinary Denies complaints or symptoms of Frequent urination. Integumentary (Skin) Complains or has symptoms of Wounds - wound on right lower leg. Objective Constitutional Sitting or standing Blood Pressure is within target range for patient.. Pulse regular and within target range for patient.Marland Kitchen Respirations regular, non-labored and within target range.. Temperature is normal and within the target range for the patient.Marland Kitchen Appears in no distress. Vitals Time Taken: 3:18 PM, Height: 64 in, Source: Stated, Weight: 130 lbs, Source: Stated, BMI: 22.3, Temperature: 98.1 F, Pulse: 72 bpm, Respiratory Rate: 18 breaths/min, Blood Pressure: 125/61 mmHg,  Capillary Blood Glucose: 256 mg/dl. Eyes Conjunctivae clear. No discharge.no icterus. Respiratory work of breathing is normal. Bilateral breath sounds are clear and equal in all lobes with no wheezes, rales or rhonchi.. Cardiovascular Heart rhythm and rate regular, without murmur or gallop.. Edema present in the right leg. Tenderness around the wound but no clear calf tenderness.Marland Kitchen Psychiatric appears at normal baseline. General Notes: Wound exam; the areas in the right mid tibia small superficial area. There is 2 blisters just laterally to this area and the area is boggy and tender. Integumentary (Hair, Skin) No primary cutaneous issue although there is swelling in the lower leg on the right. Wound #3 status is Open. Original cause of wound was Trauma. The wound is located on the Right,Anterior Lower Leg. The wound measures 1.6cm length x 0.8cm width x 0.1cm depth; 1.005cm^2 area and 0.101cm^3 volume. There is Fat Layer (Subcutaneous Tissue) Exposed exposed. There is no tunneling or undermining noted. There is a small amount of serosanguineous drainage noted. The wound margin is flat and intact. There is large (67-100%) red granulation within the wound bed. There is no necrotic tissue within the wound bed. 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 There was a Three Layer Compression Therapy Procedure by Deon Pilling, RN. Post procedure Diagnosis Wound #3: Same as Pre-Procedure Plan Follow-up Appointments: Return Appointment in 1 week. Dressing Change Frequency: Other: - 2 times per week, once by home health, Friday at wound center Skin Barriers/Peri-Wound Care: TCA  Cream or  Ointment - mixed with lotion to leg Wound Cleansing: Clean wound with Wound Cleanser May shower with protection. Primary Wound Dressing: Wound #3 Right,Anterior Lower Leg: Calcium Alginate with Silver Secondary Dressing: Wound #3 Right,Anterior Lower Leg: Dry Gauze Edema Control: 3 Layer Compression System - Right Lower Extremity Avoid standing for long periods of time Elevate legs to the level of the heart or above for 30 minutes daily and/or when sitting, a frequency of: Exercise regularly Home Health: Admit to [facility] for wound care Laboratory ordered were: Anerobic culture - right lower leg The following medication(s) was prescribed: Keflex oral 500 mg capsule 1 1 capsule oral 3 times per day for infection starting 01/06/2019 1. We put silver alginate ABDs over the wound under 3 layer compression 2. I opened and cultured 1 of the blisters next of the wound wondering whether there could be purulence. This was not obvious nevertheless the culture was sent 3. I do not really know why she has so much swelling in the right leg. This is nonpitting I do not remember this when we are dealing with her right lateral malleolus last time. I could not see any evidence of a DVT. No obvious systemic fluid overload Electronic Signature(s) Signed: 01/06/2019 7:05:19 PM By: Baruch Gouty RN, BSN Signed: 01/09/2019 6:11:18 PM By: Linton Ham MD Previous Signature: 01/06/2019 6:19:43 PM Version By: Linton Ham MD Entered By: Baruch Gouty on 01/06/2019 18:37:40 -------------------------------------------------------------------------------- HxROS Details Patient Name: Date of Service: [name], [name] 01/06/2019 2:45 PM Medical Record [id number] Patient Account Number: [id number] Date of Birth/Sex: Treating RN: [birth year] (68 y.o. [name] Primary Care Provider: Abran Richard Other Clinician: Referring Provider: Treating  Provider/Extender:Robson, Kandice Hams, Karolee Ohs in Treatment: 0 Information Obtained From Patient Constitutional Symptoms (General Health) Complaints and Symptoms: Negative for: Fatigue; Fever; Chills; Marked Weight Change Medical History: Past Medical History Notes: obesity Eyes Complaints and Symptoms: Positive for: Glasses / Contacts - glasses Medical History: Negative for: Cataracts; Glaucoma; Optic Neuritis Ear/Nose/Mouth/Throat Complaints and Symptoms: Negative for: Chronic sinus problems or rhinitis Medical History: Negative for: Chronic sinus problems/congestion; Middle ear problems Past Medical History Notes: allergic rhinitis, vocal cord dysfunction, dysphagia Cardiovascular Complaints and Symptoms: Negative for: Chest pain Medical History: Positive for: Arrhythmia - AV nodal reentry tachycardia; Hypertension Negative for: Angina; Congestive Heart Failure; Coronary Artery Disease; Deep Vein Thrombosis; Hypotension; Myocardial Infarction; Peripheral Arterial Disease; Peripheral Venous Disease; Phlebitis; Vasculitis Past Medical History Notes: HX SVT with ablation Genitourinary Complaints and Symptoms: Negative for: Frequent urination Medical History: Negative for: End Stage Renal Disease Integumentary (Skin) Complaints and Symptoms: Positive for: Wounds - wound on right lower leg Medical History: Negative for: History of Burn Hematologic/Lymphatic Medical History: Negative for: Anemia; Hemophilia; Human Immunodeficiency Virus; Lymphedema; Sickle Cell Disease Respiratory Medical History: Positive for: Asthma; Chronic Obstructive Pulmonary Disease (COPD); Sleep Apnea Negative for: Aspiration; Pneumothorax; Tuberculosis Gastrointestinal Medical History: Negative for: Cirrhosis ; Colitis; Crohns; Hepatitis A; Hepatitis B; Hepatitis C Past Medical History Notes: GERD Endocrine Medical History: Positive for: Type II Diabetes Negative for: Type I  Diabetes Past Medical History Notes: hypothyroid Time with diabetes: age 59 Treated with: Insulin, Oral agents Blood sugar tested every day: Yes Tested : 4 times per day Blood sugar testing results: Breakfast: <170 Immunological Medical History: Negative for: Lupus Erythematosus; Raynauds; Scleroderma Musculoskeletal Medical History: Positive for: Osteoarthritis Negative for: Gout; Rheumatoid Arthritis; Osteomyelitis Neurologic Medical History: Positive for: Neuropathy; Seizure Disorder Negative for: Dementia; Quadriplegia; Paraplegia Past Medical History Notes:  essential tremors Oncologic Medical History: Negative for: Received Chemotherapy; Received Radiation Psychiatric Medical History: Negative for: Anorexia/bulimia; Confinement Anxiety Past Medical History Notes: Bipolar disorder, depression hx suicide attempts in past x2 Immunizations Pneumococcal Vaccine: Received Pneumococcal Vaccination: Yes Tetanus Vaccine: Last tetanus shot: 12/26/2018 Implantable Devices None Hospitalization / Surgery History Type of Hospitalization/Surgery lumbar fusion left arm fx with hardware 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 Family and Social History Cancer: Yes - Father; Diabetes: Yes - Mother,Father,Siblings; Heart Disease: Yes - Mother; Hereditary Spherocytosis: No; Hypertension: No; Kidney Disease: No; Lung Disease: Yes - Father; Seizures: No; Stroke: No; Thyroid Problems: Yes - Mother; Tuberculosis: No; Former smoker - quit >20 yrs ago; Marital Status - Divorced; Alcohol Use: Never; Drug Use: No History; Caffeine Use: Daily - coffee; Financial Concerns: No; Food, Clothing or Shelter Needs: No; Support System Lacking: No; Transportation Concerns: No Electronic Signature(s) Signed: 01/06/2019 6:19:43 PM By: Linton Ham MD Signed: 01/09/2019 6:17:12 PM By: Levan Hurst RN, BSN Entered By: Levan Hurst on 01/06/2019  15:23:53 -------------------------------------------------------------------------------- [place] Details Patient Name: Date of Service: [name], [name] 01/06/2019 Medical Record [id number] Patient Account Number: [id number] Date of Birth/Sex: Treating RN: 17-May-1950 (69 y.o. Martyn Malay, Linda Primary Care Provider: Abran Richard Other Clinician: Referring Provider: Treating Provider/Extender:Robson, Kandice Hams, Karolee Ohs in Treatment: 0 Diagnosis Coding ICD-10 Codes Code Description [phone] Laceration without foreign body, right lower leg, subsequent encounter L97.811 Non-pressure chronic ulcer of other part of right lower leg limited to breakdown of skin E11.622 Type 2 diabetes mellitus with other skin ulcer L03.115 Cellulitis of right lower limb Facility Procedures CPT4 Code Description: [id number] - WOUND CARE VISIT-LEV 3 EST PT Modifier: 25 Quantity: 1 CPT4 Code Description: [id number] (Facility Use Only) [phone] - [facility] COMPRS LWR RT LEG Modifier: Quantity: 1 Physician Procedures CPT4 Code Description: [id number] - WC PHYS LEVEL 4 - EST PT ICD-10 Diagnosis Description S81.811D Laceration without foreign body, right lower leg, subsequen L97.811 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

## 2019-01-09 NOTE — Progress Notes (Signed)
[name], [name] ([id number]) Visit Report for 01/06/2019 Abuse/Suicide Risk Screen Details Patient Name: Date of Service: [name], [name] 01/06/2019 2:45 PM Medical Record [id number] Patient Account Number: [id number] Date of Birth/Sex: Treating RN: Jun 19, 1950 (68 y.o. [name] Primary Care Decorey Wahlert: Abran Richard Other Clinician: Referring Pakou Rainbow: Treating Katoria Yetman/Extender:Robson, Kandice Hams, Karolee Ohs in Treatment: 0 Abuse/Suicide Risk Screen Items Answer ABUSE RISK SCREEN: Has anyone close to you tried to hurt or harm you recentlyo No Do you feel uncomfortable with anyone in your familyo No Has anyone forced you do things that you didnt want to doo No Electronic Signature(s) Signed: 01/09/2019 6:17:12 PM By: Levan Hurst RN, BSN Entered By: Levan Hurst on 01/06/2019 15:23:58 -------------------------------------------------------------------------------- Activities of Daily Living Details Patient Name: Date of Service: [name], [name] 01/06/2019 2:45 PM Medical Record [id number] Patient Account Number: [id number] Date of Birth/Sex: Treating RN: Aug 17, 1950 (68 y.o. [name] Primary Care Yumalay Circle: Abran Richard Other Clinician: Referring Jais Demir: Treating Chloee Tena/Extender:Robson, Kandice Hams, Karolee Ohs in Treatment: 0 Activities of Daily Living Items Answer Activities of Daily Living (Please select one for each item) Drive Automobile Not Able Take Medications Need Assistance Use Telephone Completely Able Care for Appearance Need Assistance Use Toilet Need Assistance Bath / Shower Need Assistance Dress Self Need Assistance Feed Self Completely Able Walk Need Assistance Get In / Out Bed Need Assistance Housework Need Assistance Prepare Meals Not Able Handle Money Need Assistance Shop for Self Need Assistance Electronic Signature(s) Signed: 01/09/2019 6:17:12 PM By: Levan Hurst RN, BSN Entered By: Levan Hurst on 01/06/2019 15:24:40 -------------------------------------------------------------------------------- Education Screening Details Patient Name: Date of Service: [name] 01/06/2019 2:45 PM Medical Record [id number] Patient Account Number: [id number] Date of Birth/Sex: Treating RN: [birth year] (68 y.o. [name] Primary Care Taje Tondreau: Abran Richard Other Clinician: Referring Sheyann Sulton: Treating Brantley Wiley/Extender:Robson, Kandice Hams, Karolee Ohs in Treatment: 0 Primary Learner Assessed: Patient Learning Preferences/Education Level/Primary Language Learning Preference: Explanation, Demonstration, Printed Material Highest Education Level: College or Above Preferred Language: English Cognitive Barrier Language Barrier: No Translator Needed: No Memory Deficit: No Emotional Barrier: No Cultural/Religious Beliefs Affecting Medical Care: No Physical Barrier Impaired Vision: No Impaired Hearing: No Decreased Hand dexterity: No Knowledge/Comprehension Knowledge Level: High Comprehension Level: High Ability to understand written High instructions: Ability to understand verbal High instructions: Motivation Anxiety Level: Calm Cooperation: Cooperative Education Importance: Acknowledges Need Interest in Health Problems: Asks Questions Perception: Coherent Willingness to Engage in Self- High Management Activities: Readiness to Engage in Self- High Management Activities: Electronic Signature(s) Signed: 01/09/2019 6:17:12 PM By: Levan Hurst RN, BSN Entered By: Levan Hurst on 01/06/2019 15:25:11 -------------------------------------------------------------------------------- Fall Risk Assessment Details Patient Name: Date of Service: [name] 01/06/2019 2:45 PM Medical Record [id number] Patient Account Number: [id number] Date of Birth/Sex: Treating RN: 11/19/1950 (68 y.o. [name] Primary Care Paytyn Mesta: Abran Richard Other Clinician: Referring Jaquelynn Wanamaker: Treating Danny Zimny/Extender:Robson, Kandice Hams, Karolee Ohs in Treatment: 0 Fall Risk Assessment Items Have you had 2 or more falls in the last 12 monthso 0 Yes Have you had any fall that resulted in injury in the last 12 monthso 0 No FALLS RISK SCREEN History of falling - immediate or within 3 months 25 Yes Secondary diagnosis (Do you have 2 or more medical diagnoseso) 0 No Ambulatory aid None/bed rest/wheelchair/nurse 0 Yes Crutches/cane/walker 0 No Furniture 0 No Intravenous therapy Access/Saline/Heparin Lock 0 No Weak (short steps with or without shuffle, stooped but able to lift head 10 Yes while walking, may seek support from furniture) Impaired (  short steps with shuffle, may have difficulty arising from chair, 0 No head down, impaired balance) Mental Status Oriented to own ability 0 Yes Overestimates or forgets limitations 0 No Risk Level: Medium Risk Score: 35 Electronic Signature(s) Signed: 01/09/2019 6:17:12 PM By: Levan Hurst RN, BSN Entered By: Levan Hurst on 01/06/2019 15:25:34 -------------------------------------------------------------------------------- Foot Assessment Details Patient Name: Date of Service: [name] 01/06/2019 2:45 PM Medical Record [id number] Patient Account Number: [id number] Date of Birth/Sex: Treating RN: 07-01-1950 (68 y.o. [name] Primary Care Hieu Herms: Abran Richard Other Clinician: Referring Deserie Dirks: Treating Caeden Foots/Extender:Robson, Kandice Hams, Karolee Ohs in Treatment: 0 Foot Assessment Items Site Locations + = Sensation present, - = Sensation absent, C = Callus, U = Ulcer R = Redness, W = Warmth, M = Maceration, PU = Pre-ulcerative lesion F = Fissure, S = Swelling, D = Dryness Assessment Right: Left: Other Deformity: No No Prior Foot Ulcer: No No Prior Amputation: No No Charcot Joint: No No Ambulatory Status: Ambulatory With  Help Assistance Device: Wheelchair Gait: Steady Electronic Signature(s) Signed: 01/09/2019 6:17:12 PM By: Levan Hurst RN, BSN Entered By: Levan Hurst on 01/06/2019 15:26:56 -------------------------------------------------------------------------------- Nutrition Risk Screening Details Patient Name: Date of Service: [name], [name] 01/06/2019 2:45 PM Medical Record [id number] Patient Account Number: [id number] Date of Birth/Sex: Treating RN: 1950-06-24 (68 y.o. [name] Primary Care Dabria Wadas: Abran Richard Other Clinician: Referring Dashana Guizar: Treating Autry Prust/Extender:Robson, Kandice Hams, Karolee Ohs in Treatment: 0 Height (in): 64 Weight (lbs): 130 Body Mass Index (BMI): 22.3 Nutrition Risk Screening Items Score Screening NUTRITION RISK SCREEN: I have an illness or condition that made me change the kind and/or 2 Yes amount of food I eat I eat fewer than two meals per day 0 No I eat few fruits and vegetables, or milk products 0 No I have three or more drinks of beer, liquor or wine almost every day 0 No I have tooth or mouth problems that make it hard for me to eat 0 No I don't always have enough money to buy the food I need 0 No I eat alone most of the time 0 No I take three or more different prescribed or over-the-counter drugs a day 1 Yes 0 No Without wanting to, I have lost or gained 10 pounds in the last six months I am not always physically able to shop, cook and/or feed myself 2 Yes Nutrition Protocols Good Risk Protocol Provide education on Moderate Risk Protocol 0 nutrition High Risk Proctocol Risk Level: Moderate Risk Score: 5 Electronic Signature(s) Signed: 01/09/2019 6:17:12 PM By: Levan Hurst RN, BSN Entered By: Levan Hurst on 01/06/2019 15:25:43

## 2019-01-09 NOTE — Progress Notes (Signed)
AEOWYN, BRIGANTI ([id number]) Visit Report for 01/06/2019 Allergy List Details Patient Name: Date of Service: [name], [name] 01/06/2019 2:45 PM Medical Record [id number] Patient Account Number: [id number] Date of Birth/Sex: Treating RN: 1951-03-08 (68 y.o. [name] Primary Care Lorielle Boehning: Abran Richard Other Clinician: Referring Ezekial Arns: Treating Jasher Barkan/Extender:Robson, Kandice Hams, Karolee Ohs in Treatment: 0 Allergies Active Allergies Invokana Reaction: yeast infection losartan Reaction: angioedema Severity: Severe metformin Reaction: diarrhea latex Reaction: rash, blisters oxycodone Reaction: itching Mucinex Reaction: asthma attack Severity: Severe Allergy Notes Electronic Signature(s) Signed: 01/09/2019 6:17:12 PM By: Levan Hurst RN, BSN Entered By: Levan Hurst on 01/06/2019 15:21:11 -------------------------------------------------------------------------------- Arrival Information Details Patient Name: Date of Service: Paulita Cradle 01/06/2019 2:45 PM Medical Record [id number] Patient Account Number: [id number] Date of Birth/Sex: Treating RN: 1951-01-04 (68 y.o. [name] Primary Care Kerryann Allaire: Other Clinician: Abran Richard Referring Tamon Parkerson: Treating Latisha Lasch/Extender:Robson, Kandice Hams, Karolee Ohs in Treatment: 0 Visit Information Patient Arrived: Wheel Chair Arrival Time: 15:18 Accompanied By: alone Transfer Assistance: None Patient Identification Verified: Yes Secondary Verification Process Completed: Yes Patient Requires Transmission-Based No Precautions: Patient Has Alerts: Yes Patient Alerts: R ABI = 1.03 History Since Last Visit Added or deleted any medications: No Any new allergies or adverse reactions: No Had a fall or experienced change in activities of daily living that may affect risk of falls: No Signs or symptoms of abuse/neglect since last visito No Hospitalized since last  visit: No Implantable device outside of the clinic excluding cellular tissue based products placed in the center since last visit: No Electronic Signature(s) Signed: 01/09/2019 6:17:12 PM By: Levan Hurst RN, BSN Entered By: Levan Hurst on 01/06/2019 15:31:00 -------------------------------------------------------------------------------- Clinic Level of Care Assessment Details Patient Name: Date of Service: IKEISHA, ROWBOTTOM 01/06/2019 2:45 PM Medical Record [id number] Patient Account Number: [id number] Date of Birth/Sex: Treating RN: 03/28/1951 (68 y.o. Elam Dutch Primary Care Dashiell Franchino: Abran Richard Other Clinician: Referring Layanna Charo: Treating Andrue Dini/Extender:Robson, Kandice Hams, Karolee Ohs in Treatment: 0 Clinic Level of Care Assessment Items TOOL 1 Quantity Score []  - Use when EandM and Procedure is performed on INITIAL visit 0 ASSESSMENTS - Nursing Assessment / Reassessment X - General Physical Exam (combine w/ comprehensive assessment (listed just below) 1 20 when performed on new pt. evals) X - Comprehensive Assessment (HX, ROS, Risk Assessments, Wounds Hx, etc.) 1 25 ASSESSMENTS - Wound and Skin Assessment / Reassessment []  - Dermatologic / Skin Assessment (not related to wound area) 0 ASSESSMENTS - Ostomy and/or Continence Assessment and Care []  - Incontinence Assessment and Management 0 []  - Ostomy Care Assessment and Management (repouching, etc.) 0 PROCESS - Coordination of Care X - Simple Patient / Family Education for ongoing care 1 15 []  - Complex (extensive) Patient / Family Education for ongoing care 0 X - Staff obtains Programmer, systems, Records, Test Results / Process Orders 1 10 X - Staff telephones HHA, Nursing Homes / Clarify orders / etc 1 10 []  - Routine Transfer to another Facility (non-emergent condition) 0 []  - Routine Hospital Admission (non-emergent condition) 0 X - New Admissions / Biomedical engineer / Ordering NPWT, Apligraf,  etc. 1 15 []  - Emergency Hospital Admission (emergent condition) 0 PROCESS - Special Needs []  - Pediatric / Minor Patient Management 0 []  - Isolation Patient Management 0 []  - Hearing / Language / Visual special needs 0 []  - Assessment of Community assistance (transportation, D/C planning, etc.) 0 []  - Additional assistance / Altered mentation 0 []  - Support Surface(s) Assessment (bed, cushion, seat, etc.) 0 INTERVENTIONS -  Miscellaneous []  - External ear exam 0 []  - Patient Transfer (multiple staff / Harrel Lemon Lift / Similar devices) 0 []  - Simple Staple / Suture removal (25 or less) 0 []  - Complex Staple / Suture removal (26 or more) 0 []  - Hypo/Hyperglycemic Management (do not check if billed separately) 0 []  - Ankle / Brachial Index (ABI) - do not check if billed separately 0 Has the patient been seen at the hospital within the last three years: Yes Total Score: 95 Level Of Care: New/Established - Level 3 Electronic Signature(s) Signed: 01/06/2019 7:05:19 PM By: Baruch Gouty RN, BSN Entered By: Baruch Gouty on 01/06/2019 18:03:30 -------------------------------------------------------------------------------- Compression Therapy Details Patient Name: Date of Service: Paulita Cradle 01/06/2019 2:45 PM Medical Record [id number] Patient Account Number: [id number] Date of Birth/Sex: Treating RN: 17-Jan-1951 (68 y.o. Elam Dutch Primary Care Garek Schuneman: Abran Richard Other Clinician: Referring Estefano Victory: Treating Zaccheaus Storlie/Extender:Robson, Kandice Hams, Karolee Ohs in Treatment: 0 Compression Therapy Performed for Wound Wound #3 Right,Anterior Lower Leg Assessment: Performed By: Clinician Deon Pilling, RN Compression Type: Three Layer Post Procedure Diagnosis Same as Pre-procedure Electronic Signature(s) Signed: 01/06/2019 7:05:19 PM By: Baruch Gouty RN, BSN Entered By: Baruch Gouty on 01/06/2019  16:22:34 -------------------------------------------------------------------------------- Encounter Discharge Information Details Patient Name: Date of Service: ALEINA, CONTANT 01/06/2019 2:45 PM Medical Record [id number] Patient Account Number: [id number] Date of Birth/Sex: Treating RN: [birth year] (68 y.o. Debby Bud Primary Care Cori Henningsen: Abran Richard Other Clinician: Referring Kimm Sider: Treating Faige Seely/Extender:Robson, Kandice Hams, Karolee Ohs in Treatment: 0 Encounter Discharge Information Items Discharge Condition: Stable Ambulatory Status: Wheelchair Discharge Destination: Home Transportation: Private Auto Accompanied By: self Schedule Follow-up Appointment: Yes Clinical Summary of Care: Electronic Signature(s) Signed: 01/06/2019 6:13:58 PM By: Deon Pilling Entered By: Deon Pilling on 01/06/2019 18:08:25 -------------------------------------------------------------------------------- Lower Extremity Assessment Details Patient Name: Date of Service: ZEYA, VICENTI 01/06/2019 2:45 PM Medical Record [id number] Patient Account Number: [id number] Date of Birth/Sex: Treating RN: 1950-06-23 (68 y.o. [name] Primary Care Adiya Selmer: Abran Richard Other Clinician: Referring Alistar Mcenery: Treating Carmilla Granville/Extender:Robson, Kandice Hams, Karolee Ohs in Treatment: 0 Edema Assessment Assessed: [Left: No] [Right: No] Edema: [Left: Ye] [Right: s] Calf Left: Right: Point of Measurement: 31 cm From Medial Instep cm 36 cm Ankle Left: Right: Point of Measurement: 9 cm From Medial Instep cm 23.8 cm Vascular Assessment Pulses: Dorsalis Pedis Palpable: [Right:Yes] Electronic Signature(s) Signed: 01/09/2019 6:17:12 PM By: Levan Hurst RN, BSN Entered By: Levan Hurst on 01/06/2019 15:30:20 -------------------------------------------------------------------------------- Multi Wound Chart Details Patient Name: Date of Service: Paulita Cradle 01/06/2019 2:45 PM Medical Record [id number] Patient Account Number: [id number] Date of Birth/Sex: Treating RN: [birth year] (68 y.o. Elam Dutch Primary Care Leighanna Kirn: Abran Richard Other Clinician: Referring Shania Bjelland: Treating Jolean Madariaga/Extender:Robson, Kandice Hams, Karolee Ohs in Treatment: 0 Vital Signs Height(in): 64 Capillary Blood 256 Glucose(mg/dl): Weight(lbs): 130 Pulse(bpm): 40 Body Mass Index(BMI): 22 Blood Pressure(mmHg): 125/61 Temperature(F): 98.1 Respiratory 18 Rate(breaths/min): Photos: [3:No Photos] [N/A:N/A] Wound Location: [3:Right Lower Leg - Anterior N/A] Wounding Event: [3:Trauma] [N/A:N/A] Primary Etiology: [3:Venous Leg Ulcer] [N/A:N/A] Comorbid History: [3:Asthma, Chronic Obstructive Pulmonary Disease (COPD), Sleep Apnea, Arrhythmia, Hypertension, Type II Diabetes, Osteoarthritis, Neuropathy, Seizure Disorder] [N/A:N/A] Date Acquired: [3:12/19/2018] [N/A:N/A] Weeks of Treatment: [3:0] [N/A:N/A] Wound Status: [3:Open] [N/A:N/A] Measurements L x W x D 1.6x0.8x0.1 [N/A:N/A] (cm) Area (cm) : [3:1.005] [N/A:N/A] Volume (cm) : [3:0.101] [N/A:N/A] Classification: [3:Full Thickness Without Exposed Support Structures] [N/A:N/A] Exudate Amount: [3:Small] [N/A:N/A] Exudate Type: [3:Serosanguineous] [N/A:N/A] Exudate Color: [3:red, brown] [N/A:N/A] Wound Margin: [3:Flat and Intact] [N/A:N/A] Granulation Amount: [3:Large (  67-100%)] [N/A:N/A] Granulation Quality: [3:Red] [N/A:N/A] Necrotic Amount: [3:None Present (0%)] [N/A:N/A] Exposed Structures: [3:Fat Layer (Subcutaneous N/A Tissue) Exposed: Yes Fascia: No Tendon: No Muscle: No Joint: No Bone: No] Epithelialization: [3:None] [N/A:N/A N/A] Treatment Notes Electronic Signature(s) Signed: 01/06/2019 6:19:43 PM By: Linton Ham MD Signed: 01/06/2019 7:05:19 PM By: Baruch Gouty RN, BSN Entered By: Linton Ham on 01/06/2019  17:05:43 -------------------------------------------------------------------------------- Multi-Disciplinary Care Plan Details Patient Name: Date of Service: TARAMARIE, SCHNAKE 01/06/2019 2:45 PM Medical Record [id number] Patient Account Number: [id number] Date of Birth/Sex: Treating RN: 1950-07-16 (68 y.o. Elam Dutch Primary Care Ancil Dewan: Abran Richard Other Clinician: Referring Murna Backer: Treating Gabriana Wilmott/Extender:Robson, Kandice Hams, Karolee Ohs in Treatment: 0 Active Inactive Abuse / Safety / Falls / Self Care Management Nursing Diagnoses: History of Falls Potential for falls Goals: Patient will remain injury free related to falls Date Initiated: 01/06/2019 Target Resolution Date: 02/03/2019 Goal Status: Active Patient/caregiver will verbalize/demonstrate measures taken to prevent injury and/or falls Date Initiated: 01/06/2019 Target Resolution Date: 02/03/2019 Goal Status: Active Interventions: Assess fall risk on admission and as needed Assess impairment of mobility on admission and as needed per policy Notes: Venous Leg Ulcer Nursing Diagnoses: Knowledge deficit related to disease process and management Potential for venous Insuffiency (use before diagnosis confirmed) Goals: Patient will maintain optimal edema control Date Initiated: 01/06/2019 Target Resolution Date: 02/03/2019 Goal Status: Active Interventions: Assess peripheral edema status every visit. Compression as ordered Provide education on venous insufficiency Treatment Activities: Therapeutic compression applied : 01/06/2019 Notes: Wound/Skin Impairment Nursing Diagnoses: Impaired tissue integrity Knowledge deficit related to ulceration/compromised skin integrity Goals: Patient/caregiver will verbalize understanding of skin care regimen Date Initiated: 01/06/2019 Target Resolution Date: 02/03/2019 Goal Status: Active Ulcer/skin breakdown will have a volume reduction of 30% by  week 4 Date Initiated: 01/06/2019 Target Resolution Date: 02/03/2019 Goal Status: Active Interventions: Assess patient/caregiver ability to obtain necessary supplies Assess patient/caregiver ability to perform ulcer/skin care regimen upon admission and as needed Assess ulceration(s) every visit Notes: Electronic Signature(s) Signed: 01/06/2019 7:05:19 PM By: Baruch Gouty RN, BSN Entered By: Baruch Gouty on 01/06/2019 16:17:03 -------------------------------------------------------------------------------- Pain Assessment Details Patient Name: Date of Service: Paulita Cradle 01/06/2019 2:45 PM Medical Record [id number] Patient Account Number: [id number] Date of Birth/Sex: Treating RN: 16-Jun-1950 (68 y.o. [name] Primary Care Sarra Rachels: Abran Richard Other Clinician: Referring Livio Ledwith: Treating Ethelmae Ringel/Extender:Robson, Kandice Hams, Karolee Ohs in Treatment: 0 Active Problems Location of Pain Severity and Description of Pain Patient Has Paino Yes Site Locations Pain Location: Pain in Ulcers With Dressing Change: Yes Duration of the Pain. Constant / Intermittento Intermittent Rate the pain. Current Pain Level: 3 Worst Pain Level: 10 Least Pain Level: 0 Character of Pain Describe the Pain: Sharp Pain Management and Medication Current Pain Management: Medication: Yes Cold Application: No Rest: No Massage: No Activity: No T.E.N.S.: No Heat Application: No Leg drop or elevation: No Is the Current Pain Management Adequate: Adequate How does your wound impact your activities of daily livingo Sleep: No Bathing: No Appetite: No Relationship With Others: No Bladder Continence: No Emotions: No Bowel Continence: No Work: No Toileting: No Drive: No Dressing: No Hobbies: No Electronic Signature(s) Signed: 01/09/2019 6:17:12 PM By: Levan Hurst RN, BSN Entered By: Levan Hurst on 01/06/2019  15:33:09 -------------------------------------------------------------------------------- Patient/Caregiver Education Details Patient Name: Date of Service: Paulita Cradle 10/2/2020andnbsp2:45 PM Medical Record [id number] Patient Account Number: [id number] Date of Birth/Gender: Treating RN: 07/07/1950 (68 y.o. Elam Dutch Primary Care Physician: Abran Richard Other Clinician: Referring Physician: Treating Physician/Extender:Robson, Kandice Hams, Langley Gauss  Weeks in Treatment: 0 Education Assessment Education Provided To: Patient Education Topics Provided Infection: Methods: Explain/Verbal Responses: Reinforcements needed, State content correctly Venous: Methods: Explain/Verbal Responses: Reinforcements needed, State content correctly Electronic Signature(s) Signed: 01/06/2019 7:05:19 PM By: Baruch Gouty RN, BSN Entered By: Baruch Gouty on 01/06/2019 16:20:39 -------------------------------------------------------------------------------- Wound Assessment Details Patient Name: Date of Service: REEVES, [place]SE[place] 01/06/2019 2:45 PM Medical Record [id number] Patient Account Number: [id number] Date of Birth/Sex: Treating RN: 11-Oct-1950 (68 y.o. [name] Primary Care Destinie Thornsberry: Abran Richard Other Clinician: Referring Farah Lepak: Treating Alyona Romack/Extender:Robson, Kandice Hams, Karolee Ohs in Treatment: 0 Wound Status Wound Number: 3 Primary Venous Leg Ulcer Etiology: Wound Location: Right Lower Leg - Anterior Wound Open Wounding Event: Trauma Status: Date Acquired: 12/19/2018 Comorbid Asthma, Chronic Obstructive Pulmonary Weeks Of Treatment: 0 History: Disease (COPD), Sleep Apnea, Arrhythmia, Clustered Wound: No Hypertension, Type II Diabetes, Osteoarthritis, Neuropathy, Seizure Disorder Photos Wound Measurements Length: (cm) 1.6 % Reduct Width: (cm) 0.8 % Reduct Depth: (cm) 0.1 Epitheli Area: (cm) 1.005 Tunneli Volume: (cm)  0.101 Undermi Wound Description Classification: Full Thickness Without Exposed Support Foul Odo Structures Slough/F Wound Flat and Intact Margin: Exudate Small Amount: Exudate Serosanguineous Type: Exudate red, brown Color: Wound Bed Granulation Amount: Large (67-100%) Granulation Quality: Red Fascia E Necrotic Amount: None Present (0%) Fat Laye Tendon E Muscle E Joint Ex Bone Exp r After Cleansing: No ibrino No Exposed Structure xposed: No r (Subcutaneous Tissue) Exposed: Yes xposed: No xposed: No posed: No osed: No ion in Area: 0% ion in Volume: 0% alization: None ng: No ning: No Treatment Notes Wound #3 (Right, Anterior Lower Leg) 1. Cleanse With Wound Cleanser 2. Periwound Care Moisturizing lotion 3. Primary Dressing Applied Calcium Alginate Ag 4. Secondary Dressing Dry Gauze 6. Support Layer Applied 3 layer compression wrap Notes netting. Electronic Signature(s) Signed: 01/09/2019 4:14:10 PM By: Mikeal Hawthorne EMT/HBOT Signed: 01/09/2019 6:17:12 PM By: Levan Hurst RN, BSN Entered By: Mikeal Hawthorne on 01/09/2019 10:28:18 -------------------------------------------------------------------------------- [place] Details Patient Name: Date of Service: ANEITA, ARBELO 01/06/2019 2:45 PM Medical Record [id number] Patient Account Number: [id number] Date of Birth/Sex: Treating RN: 07/27/1950 (68 y.o. [name] Primary Care Izac Faulkenberry: Abran Richard Other Clinician: Referring Silvester Reierson: Treating Kenitha Glendinning/Extender:Robson, Kandice Hams, Karolee Ohs in Treatment: 0 Vital Signs Time Taken: 15:18 Temperature (F): 98.1 Height (in): 64 Pulse (bpm): 72 Source: Stated Respiratory Rate (breaths/min): 18 Weight (lbs): 130 Blood Pressure (mmHg): 125/61 Source: Stated Capillary Blood Glucose (mg/dl): 256 Body Mass Index (BMI): 22.3 Reference Range: 80 - 120 mg / dl Electronic Signature(s) Signed: 01/09/2019 6:17:12 PM By: Levan Hurst  RN, BSN Entered By: Levan Hurst on 01/06/2019 15:21:07

## 2019-01-16 ENCOUNTER — Other Ambulatory Visit: Payer: Self-pay

## 2019-01-16 ENCOUNTER — Encounter (HOSPITAL_BASED_OUTPATIENT_CLINIC_OR_DEPARTMENT_OTHER): Payer: Medicare Other | Attending: Internal Medicine | Admitting: Internal Medicine

## 2019-01-16 DIAGNOSIS — E11622 Type 2 diabetes mellitus with other skin ulcer: Secondary | ICD-10-CM | POA: Diagnosis not present

## 2019-01-16 NOTE — Progress Notes (Signed)
[name], [name] ([id number]) Visit Report for 01/16/2019 HPI Details Patient Name: Date of Service: [name], [name] 01/16/2019 10:15 AM Medical Record [id number] Patient Account Number: [id number] Date of Birth/Sex: Treating RN: 12-22-1950 (68 y.o. [name] Primary Care Provider: Abran Richard Other Clinician: Referring Provider: Treating Provider/Extender:Lakiah Dhingra, Kandice Hams, Karolee Ohs in Treatment: 1 History of Present Illness HPI Description: ADMISSION 02/14/2018 [name] is a 68 year old woman who lives in an assisted living in [place]. She is a type II diabetic on insulin and a remote smoker. She tells [place] she had a wound on the right lateral ankle for up the past month. Not totally clear how this was discovered. She has been using some form of "white-cream" on this. She normally sleeps on her right side and keeps the right leg on a pillow. ABI on the right was 1.18 in this clinic clinic. The patient has a history of asthma, severe essential tremor status post placement of a brain stimulator, gastroesophageal reflux disease, osteoarthritis, supraventricular tachycardia status post attempts at ablation and a lumbar fusion. 03/02/18 on evaluation today patient actually appears to be doing excellent at this point in regard to her ankle ulcer. It seems to be showing signs of measuring just a little bit smaller and overall is cleaning up compared to previous as far as the slough on the surface of the wound is concerned. With that being said she does not appear to have any evidence of infection at this time which is good news. No fevers, chills, nausea, or vomiting noted at this time. 03/10/2018; right lateral malleolus ulcer. Using Iodoflex. She lives in an assisted living in [place] and has home health. 03/17/2018; right lateral malleolus ulcer. Using Iodoflex. She has home health and lives in assisted living in [place]. The wound  dimensions are not any better however the wound surface looks a lot better. Still requiring debridement. We will run puraply through insurance. 03/24/2018 right lateral malleolus ulcer. Presumably a pressure ulcer. We have been using Iodoflex. We have been able to get down to a healthy looking wound surface this is a deep worrisome wound clearly down to muscle layer with close proximity to bone. She has a co-pay with pure apply however she has Medicaid as a secondary. We will go ahead and order this for next time we use Prisma today. She does not appear to have an arterial issue 04/01/2018; right lateral malleolus ulcer. Presumably a pressure ulcer. The depth of this is not come in at all this week. Wound bed does not look too bad but it is precariously close to bone. Puraply #1 04/08/2018; right lateral malleolus ulcer. Presumably a pressure ulcer. Surface of the wound looks better although not much improvement in depth. Still precariously close to the underlying lateral malleolus itself. Puraply #2 04/15/2018; perhaps some improvement in the wound area but certainly not the volume. This is a deep wound on the right lateral malleolus. Puraply #3 not much improvement so far. 04/22/2018.; I really do not see much improvement here. Still a deep probing area. We had some initial improvement but I am not seeing much in the last month. The patient has a very robust dorsalis pedis and a palpable popliteal but I do not feel a posterior tibial pulse. There is generally no surrounding erythema. We have used 3 Puraply's but I do not see much improvement here. 04/29/18; small orifice but still a deep probing area. Puraply #5 applied. She did not get either x-ray or arterial  studies done as apparently the order did not make it to any pending radiology. I believe they actually sent the note that I sent to the assisted living where she lives 1/31; small orifice but still deep tunneling. Base of the wound looks  better. Puraply #6. X-ray was negative for osteomyelitis. Culture I did last time was also negative 05/18/18 on evaluation today patient actually appears to be doing well in regard to her lateral ankle ulcer on the right. Fortunately there's no sign of infection at this time. She's been tolerating the dressing changes without complication. We have been utilizing PuraPly Matrix. 2/20; right lateral malleolus. We have been using puraply. I have not seen this in a few weeks and actually this looks quite a bit better. 2/27; right lateral malleolus. We have been using puraply although we did not have one to apply today we actually used endoform. ARTERIAL STUDIES; ABI on the right at 1.03 triphasic waveforms. TBI at 1.20 both of which were normal. Study on the left was also fairly good no evidence of significant left lower extremity arterial disease 3/3; patient was brought back for reapplication of puraply. This is now a very small wound in terms of orifice but with 0.4 cm of depth and some undermining. This is not something that would be easy reopen. 3/13 still 0.4 cm in depth but without much undermining. Puraply #9 today 3/23; absolutely no improvement. Still 0.4 cm in depth with not much change in the overall appearance of this deep punched out area. I did not reapplied puraply #10 substituting endoform. 3/31; not a lot of improvement here. She still has depth with undermining. I have been using endoform on this. Visibly the wound is smaller with healthy tissue however there is not much granulation filling in the wound here. 4/9; really no improvement. There is still above 0.4 cm of depth with some undermining. The superficial surface area of the wound has gotten smaller but not the depth or undermining. I been using endoform. She had a full course of Puraply. The recent PCR culture I did last week only showed a small amount of Corynebacterium which is unlikely to be a significant pathogen 4/16;  small wound. 0.5 cm in depth. Too small to really determine the undermining but I think there probably is some. I have been using Iodoflex since last week. 4/30; she has an unmanageable co-pay for Grafix PL. 0.7 cm in depth absolutely no improvement. Still using Iodoflex. I am not certain whether the home health people are packing this gently into the wound 5/14; surprisingly the patient arrives in with a very small open area remaining on the right lateral malleolus. She is using Iodoflex we are offloading this with felt 5/28; the patient's depth is 0.4 cm which is probably a little more than last time at which time it looks surprisingly shallow. We have been using Iodoflex for about a month, I went back to endoform today. She claims she is offloading this religiously 6/11; wound is smaller but still with roughly 0.2 cm of depth. Endoform started last visit which seems to be helping. 6/25; the wound is closed over. Eschar removed there is no open wound. This was presumably a pressure ulcer at one point with considerable depth albeit a small wound. This took on a more nightly long period of time to get to fill- in. Ultimately it seemed to respond best to endoform. She did have a course of puraply READMISSION 01/06/2019 This patient is a type II diabetic  who we cared for in this clinic with a very difficult to close area over her right lateral malleolus. We eventually got this to close in June she wears support stockings. She comes back in after sustaining a fall 2 weeks ago. She tells me she scraped her leg anteriorly in the mid tibia. There is some surrounding blisters. They have been using Neosporin. There is some erythema and tenderness around the wound area. She has not been in hospital there is been no medical change. She is not felt to have an arterial issue last ABI in our clinic was 1.03 10/12; culture I did last week was negative. We have been using silver alginate under 3 layer  compression Electronic Signature(s) Signed: 01/16/2019 5:50:55 PM By: Linton Ham MD Entered By: Linton Ham on 01/16/2019 15:39:34 -------------------------------------------------------------------------------- Physical Exam Details Patient Name: Date of Service: [name], [name] 01/16/2019 10:15 AM Medical Record [id number] Patient Account Number: [id number] Date of Birth/Sex: Treating RN: [birth year] (68 y.o. [name] Primary Care Provider: Abran Richard Other Clinician: Referring Provider: Treating Provider/Extender:Dani Danis, Kandice Hams, Karolee Ohs in Treatment: 1 Constitutional Patient is hypertensive.. Pulse regular and within target range for patient.Marland Kitchen Respirations regular, non-labored and within target range.. Temperature is normal and within the target range for the patient.Marland Kitchen Appears in no distress. Respiratory work of breathing is normal. Cardiovascular No pulses palpable. Good edema control. Integumentary (Hair, Skin) No erythema around the wound. Psychiatric appears at normal baseline. Notes Wound exam; areas in the right mid tibia this is superficial and epithelializing. Electronic Signature(s) Signed: 01/16/2019 5:50:55 PM By: Linton Ham MD Entered By: Linton Ham on 01/16/2019 15:42:32 -------------------------------------------------------------------------------- Physician Orders Details Patient Name: Date of Service: [name], [name] 01/16/2019 10:15 AM Medical Record [id number] Patient Account Number: [id number] Date of Birth/Sex: Treating RN: Nov 03, 1950 (68 y.o. Clearnce Sorrel Primary Care Provider: Abran Richard Other Clinician: Referring Provider: Treating Provider/Extender:Honesty Menta, Kandice Hams, Karolee Ohs in Treatment: 1 Verbal / Phone Orders: No Diagnosis Coding Follow-up Appointments Return Appointment in 2 weeks. Dressing Change Frequency Wound #3 Right,Anterior Lower Leg Other: - 2  times per week by home health Skin Barriers/Peri-Wound Care Wound #3 Right,Anterior Lower Leg TCA Cream or Ointment - mixed with lotion to leg Wound Cleansing Wound #3 Right,Anterior Lower Leg Clean wound with Wound Cleanser May shower with protection. Primary Wound Dressing Wound #3 Right,Anterior Lower Leg Calcium Alginate with Silver Secondary Dressing Wound #3 Right,Anterior Lower Leg Dry Gauze Edema Control 3 Layer Compression System - Right Lower Extremity Avoid standing for long periods of time Elevate legs to the level of the heart or above for 30 minutes daily and/or when sitting, a frequency of: Exercise regularly [facility] for wound care. - [facility] Electronic Signature(s) Signed: 01/16/2019 5:18:03 PM By: Kela Millin Signed: 01/16/2019 5:50:55 PM By: Linton Ham MD Entered By: Kela Millin on 01/16/2019 11:03:30 -------------------------------------------------------------------------------- Problem List Details Patient Name: Date of Service: [name], [name] 01/16/2019 10:15 AM Medical Record [id number] Patient Account Number: [id number] Date of Birth/Sex: Treating RN: 1950/11/01 (68 y.o. 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 [name] Primary Care Provider: Abran Richard Other Clinician: Referring Provider: Treating Provider/Extender:Paullette Mckain, Kandice Hams, Karolee Ohs in Treatment: 1 Subjective History of Present Illness (HPI) ADMISSION 02/14/2018 Mrs. Linen is a 68 year old woman who lives in an assisted living in [place]. She is a type II diabetic on insulin and a remote smoker. She tells [place] she had a wound on the right lateral ankle for up the past month. Not totally clear how this was discovered. She has been using some form of "white-cream" on this. She normally sleeps on her right side and keeps the right leg on a pillow. ABI on the right was 1.18 in this clinic clinic. The patient has a history of asthma, severe essential tremor status post placement of a brain stimulator, gastroesophageal reflux disease, osteoarthritis, supraventricular tachycardia status post attempts at ablation and a lumbar fusion. 03/02/18 on evaluation today patient actually appears to be doing excellent at this point in regard to her ankle ulcer. It seems to be showing signs of measuring just a little bit smaller and overall is cleaning up compared to previous as far as the slough on the surface of the wound is concerned. With that being said she does not appear to have any evidence of infection at this time which is good news. No fevers, chills, nausea, or vomiting noted at this time. 03/10/2018; right lateral malleolus ulcer. Using Iodoflex. She lives in an assisted living in [place] and has home health. 03/17/2018; right lateral malleolus ulcer. Using Iodoflex. She has home health and lives in assisted  living in [place]. The wound dimensions are not any better however the wound surface looks a lot better. Still requiring debridement. We will run puraply through insurance. 03/24/2018 right lateral malleolus ulcer. Presumably a pressure ulcer. We have been using Iodoflex. We have been able to get down to a healthy looking wound surface this is a deep worrisome wound clearly down to muscle layer with close proximity to bone. She has a co-pay with pure apply however she has Medicaid as a secondary. We will go ahead and order this for next time we use Prisma today. She does not appear to have an arterial issue 04/01/2018; right lateral malleolus ulcer. Presumably a pressure ulcer. The depth of this is not come in at all this week. Wound bed does not look too bad but it is precariously close to bone. Puraply #1 04/08/2018; right lateral malleolus ulcer. Presumably a pressure ulcer. Surface of the wound looks better although not much improvement in depth. Still precariously close to the underlying lateral malleolus itself. Puraply #2 04/15/2018; perhaps some improvement in the wound area but certainly not the volume. This is a deep wound on the right lateral malleolus. Puraply #3 not much improvement so far. 04/22/2018.; I really do not see much improvement here. Still a deep probing area. We had some initial improvement but I am not seeing much in the last month. The patient has a very robust dorsalis pedis and a palpable popliteal but I do not feel a posterior tibial pulse. There is generally no surrounding erythema. We have used 3 Puraply's but I do not see much improvement here. 04/29/18; small orifice but still  a deep probing area. Puraply #5 applied. She did not get either x-ray or arterial studies done as apparently the order did not make it to any pending radiology. I believe they actually sent the note that I sent to the assisted living where she lives 1/31; small orifice but still deep  tunneling. Base of the wound looks better. Puraply #6. X-ray was negative for osteomyelitis. Culture I did last time was also negative 05/18/18 on evaluation today patient actually appears to be doing well in regard to her lateral ankle ulcer on the right. Fortunately there's no sign of infection at this time. She's been tolerating the dressing changes without complication. We have been utilizing PuraPly Matrix. 2/20; right lateral malleolus. We have been using puraply. I have not seen this in a few weeks and actually this looks quite a bit better. 2/27; right lateral malleolus. We have been using puraply although we did not have one to apply today we actually used endoform. ARTERIAL STUDIES; ABI on the right at 1.03 triphasic waveforms. TBI at 1.20 both of which were normal. Study on the left was also fairly good no evidence of significant left lower extremity arterial disease 3/3; patient was brought back for reapplication of puraply. This is now a very small wound in terms of orifice but with 0.4 cm of depth and some undermining. This is not something that would be easy reopen. 3/13 still 0.4 cm in depth but without much undermining. Puraply #9 today 3/23; absolutely no improvement. Still 0.4 cm in depth with not much change in the overall appearance of this deep punched out area. I did not reapplied puraply #10 substituting endoform. 3/31; not a lot of improvement here. She still has depth with undermining. I have been using endoform on this. Visibly the wound is smaller with healthy tissue however there is not much granulation filling in the wound here. 4/9; really no improvement. There is still above 0.4 cm of depth with some undermining. The superficial surface area of the wound has gotten smaller but not the depth or undermining. I been using endoform. She had a full course of Puraply. The recent PCR culture I did last week only showed a small amount of Corynebacterium which is  unlikely to be a significant pathogen 4/16; small wound. 0.5 cm in depth. Too small to really determine the undermining but I think there probably is some. I have been using Iodoflex since last week. 4/30; she has an unmanageable co-pay for Grafix PL. 0.7 cm in depth absolutely no improvement. Still using Iodoflex. I am not certain whether the home health people are packing this gently into the wound 5/14; surprisingly the patient arrives in with a very small open area remaining on the right lateral malleolus. She is using Iodoflex we are offloading this with felt 5/28; the patient's depth is 0.4 cm which is probably a little more than last time at which time it looks surprisingly shallow. We have been using Iodoflex for about a month, I went back to endoform today. She claims she is offloading this religiously 6/11; wound is smaller but still with roughly 0.2 cm of depth. Endoform started last visit which seems to be helping. 6/25; the wound is closed over. Eschar removed there is no open wound. This was presumably a pressure ulcer at one point with considerable depth albeit a small wound. This took on a more nightly long period of time to get to fill- in. Ultimately it seemed to respond best to endoform. She  did have a course of puraply READMISSION 01/06/2019 This patient is a type II diabetic who we cared for in this clinic with a very difficult to close area over her right lateral malleolus. We eventually got this to close in June she wears support stockings. She comes back in after sustaining a fall 2 weeks ago. She tells me she scraped her leg anteriorly in the mid tibia. There is some surrounding blisters. They have been using Neosporin. There is some erythema and tenderness around the wound area. She has not been in hospital there is been no medical change. She is not felt to have an arterial issue last ABI in our clinic was 1.03 10/12; culture I did last week was negative. We have been  using silver alginate under 3 layer compression Objective Constitutional Patient is hypertensive.. Pulse regular and within target range for patient.Marland Kitchen Respirations regular, non-labored and within target range.. Temperature is normal and within the target range for the patient.Marland Kitchen Appears in no distress. Vitals Time Taken: 10:40 AM, Height: 64 in, Weight: 130 lbs, BMI: 22.3, Temperature: 98.4 F, Pulse: 80 bpm, Respiratory Rate: 18 breaths/min, Blood Pressure: 172/90 mmHg. Respiratory work of breathing is normal. Cardiovascular No pulses palpable. Good edema control. Psychiatric appears at normal baseline. General Notes: Wound exam; areas in the right mid tibia this is superficial and epithelializing. Integumentary (Hair, Skin) No erythema around the wound. Wound #3 status is Open. Original cause of wound was Trauma. The wound is located on the Right,Anterior Lower Leg. The wound measures 2.5cm length x 1.6cm width x 0.1cm depth; 3.142cm^2 area and 0.314cm^3 volume. There is Fat Layer (Subcutaneous Tissue) Exposed exposed. There is no tunneling or undermining noted. There is a small amount of serosanguineous drainage noted. The wound margin is flat and intact. There is large (67-100%) red granulation within the wound bed. There is no necrotic tissue within the wound bed. 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 There was a Three Layer Compression Therapy Procedure by Kela Millin, RN. Post procedure Diagnosis Wound #3: Same as Pre-Procedure Plan Follow-up Appointments: Return Appointment in 2 weeks. Dressing Change Frequency: Wound #3 Right,Anterior Lower  Leg: Other: - 2 times per week by home health Skin Barriers/Peri-Wound Care: Wound #3 Right,Anterior Lower Leg: TCA Cream or Ointment - mixed with lotion to leg Wound Cleansing: Wound #3 Right,Anterior Lower Leg: Clean wound with Wound Cleanser May shower with protection. Primary Wound Dressing: Wound #3 Right,Anterior Lower Leg: Calcium Alginate with Silver Secondary Dressing: Wound #3 Right,Anterior Lower Leg: Dry Gauze Edema Control: 3 Layer Compression System - Right Lower Extremity Avoid standing for long periods of time Elevate legs to the level of the heart or above for 30 minutes daily and/or when sitting, a frequency of: Exercise regularly Home Health: [facility] for wound care. - [facility]. Continue with silver alginate under 3 layer compression. We have home health changing the dressing Electronic Signature(s) Signed: 01/16/2019 5:50:55 PM By: Linton Ham MD Entered By: Linton Ham on 01/16/2019 15:44:00 -------------------------------------------------------------------------------- SuperBill Details Patient Name: Date of Service: [name], [name] 01/16/2019 Medical Record [id number] Patient Account Number: [id number] Date of Birth/Sex: Treating RN: 1951-03-29 (68 y.o. Clearnce Sorrel Primary Care Provider: Yong Channel,  Langley Gauss Other Clinician: Referring Provider: Treating Provider/Extender:Tecumseh Yeagley, Kandice Hams, Karolee Ohs in Treatment: 1 Diagnosis Coding ICD-10 Codes Code Description [phone] Laceration without foreign body, right lower leg, subsequent encounter L97.811 Non-pressure chronic ulcer of other part of right lower leg limited to breakdown of skin E11.622 Type 2 diabetes mellitus with other skin ulcer L03.115 Cellulitis of right lower limb Facility Procedures CPT4 Code Description: [id number] (Facility Use Only) [phone] - APPLY MULTLAY COMPRS LWR RT LEG Modifier: Quantity: 1 Physician Procedures CPT4  Code Description: [id number] [facility] - WC PHYS LEVEL 3 - EST PT ICD-10 Diagnosis Description S81.811D Laceration without foreign body, right lower leg, subsequ L97.811 Non-pressure chronic ulcer of other part of right lower l skin E11.622 Type 2 diabetes  mellitus with other skin ulcer Modifier: ent encounter eg limited to b Quantity: 1 reakdown of Electronic Signature(s) Signed: 01/16/2019 5:50:55 PM By: Linton Ham MD Entered By: Linton Ham on 01/16/2019 15:44:25

## 2019-01-23 ENCOUNTER — Other Ambulatory Visit: Payer: Self-pay

## 2019-01-23 ENCOUNTER — Encounter: Payer: Self-pay | Admitting: Podiatry

## 2019-01-23 ENCOUNTER — Ambulatory Visit (INDEPENDENT_AMBULATORY_CARE_PROVIDER_SITE_OTHER): Payer: Medicare Other | Admitting: Podiatry

## 2019-01-23 DIAGNOSIS — B351 Tinea unguium: Secondary | ICD-10-CM

## 2019-01-23 DIAGNOSIS — M79675 Pain in left toe(s): Secondary | ICD-10-CM

## 2019-01-23 DIAGNOSIS — M79674 Pain in right toe(s): Secondary | ICD-10-CM

## 2019-01-23 NOTE — Patient Instructions (Signed)
Diabetes Mellitus and Foot Care Foot care is an important part of your health, especially when you have diabetes. Diabetes may cause you to have problems because of poor blood flow (circulation) to your feet and legs, which can cause your skin to:  Become thinner and drier.  Break more easily.  Heal more slowly.  Peel and crack. You may also have nerve damage (neuropathy) in your legs and feet, causing decreased feeling in them. This means that you may not notice minor injuries to your feet that could lead to more serious problems. Noticing and addressing any potential problems early is the best way to prevent future foot problems. How to care for your feet Foot hygiene  Wash your feet daily with warm water and mild soap. Do not use hot water. Then, pat your feet and the areas between your toes until they are completely dry. Do not soak your feet as this can dry your skin.  Trim your toenails straight across. Do not dig under them or around the cuticle. File the edges of your nails with an emery board or nail file.  Apply a moisturizing lotion or petroleum jelly to the skin on your feet and to dry, brittle toenails. Use lotion that does not contain alcohol and is unscented. Do not apply lotion between your toes. Shoes and socks  Wear clean socks or stockings every day. Make sure they are not too tight. Do not wear knee-high stockings since they may decrease blood flow to your legs.  Wear shoes that fit properly and have enough cushioning. Always look in your shoes before you put them on to be sure there are no objects inside.  To break in new shoes, wear them for just a few hours a day. This prevents injuries on your feet. Wounds, scrapes, corns, and calluses  Check your feet daily for blisters, cuts, bruises, sores, and redness. If you cannot see the bottom of your feet, use a mirror or ask someone for help.  Do not cut corns or calluses or try to remove them with medicine.  If you  find a minor scrape, cut, or break in the skin on your feet, keep it and the skin around it clean and dry. You may clean these areas with mild soap and water. Do not clean the area with peroxide, alcohol, or iodine.  If you have a wound, scrape, corn, or callus on your foot, look at it several times a day to make sure it is healing and not infected. Check for: ? Redness, swelling, or pain. ? Fluid or blood. ? Warmth. ? Pus or a bad smell. General instructions  Do not cross your legs. This may decrease blood flow to your feet.  Do not use heating pads or hot water bottles on your feet. They may burn your skin. If you have lost feeling in your feet or legs, you may not know this is happening until it is too late.  Protect your feet from hot and cold by wearing shoes, such as at the beach or on hot pavement.  Schedule a complete foot exam at least once a year (annually) or more often if you have foot problems. If you have foot problems, report any cuts, sores, or bruises to your health care provider immediately. Contact a health care provider if:  You have a medical condition that increases your risk of infection and you have any cuts, sores, or bruises on your feet.  You have an injury that is not   healing.  You have redness on your legs or feet.  You feel burning or tingling in your legs or feet.  You have pain or cramps in your legs and feet.  Your legs or feet are numb.  Your feet always feel cold.  You have pain around a toenail. Get help right away if:  You have a wound, scrape, corn, or callus on your foot and: ? You have pain, swelling, or redness that gets worse. ? You have fluid or blood coming from the wound, scrape, corn, or callus. ? Your wound, scrape, corn, or callus feels warm to the touch. ? You have pus or a bad smell coming from the wound, scrape, corn, or callus. ? You have a fever. ? You have a red line going up your leg. Summary  Check your feet every day  for cuts, sores, red spots, swelling, and blisters.  Moisturize feet and legs daily.  Wear shoes that fit properly and have enough cushioning.  If you have foot problems, report any cuts, sores, or bruises to your health care provider immediately.  Schedule a complete foot exam at least once a year (annually) or more often if you have foot problems. This information is not intended to replace advice given to you by your health care provider. Make sure you discuss any questions you have with your health care provider. Document Released: 03/20/2000 Document Revised: 05/05/2017 Document Reviewed: 04/24/2016 [employer] Patient Education  2020 [employer]  

## 2019-01-26 NOTE — Progress Notes (Signed)
Patient was not seen today. [name] had a Podiatrist come to the home and do the resident's toenails 2 weeks ago. [name] would not be eligible for today. She states her desire is to continue to come to our clinic. I have asked her to discuss with the administrator of her facility. We will schedule her for 9 weeks.

## 2019-01-30 ENCOUNTER — Encounter (HOSPITAL_BASED_OUTPATIENT_CLINIC_OR_DEPARTMENT_OTHER): Payer: Medicare Other | Admitting: Internal Medicine

## 2019-01-30 ENCOUNTER — Other Ambulatory Visit: Payer: Self-pay

## 2019-01-30 DIAGNOSIS — E11622 Type 2 diabetes mellitus with other skin ulcer: Secondary | ICD-10-CM | POA: Diagnosis not present

## 2019-01-30 NOTE — Progress Notes (Signed)
[name], [name] ([id number]) Visit Report for 01/30/2019 HPI Details Patient Name: Date of Service: [name], [name] 01/30/2019 10:15 AM Medical Record [id number] Patient Account Number: [id number] Date of Birth/Sex: Treating RN: [birth year] (68 y.o. [name] Primary Care Provider: Abran Richard Other Clinician: Referring Provider: Treating Provider/Extender:Drianna Chandran, Kandice Hams, Karolee Ohs in Treatment: 3 History of Present Illness HPI Description: ADMISSION 02/14/2018 [name] is a 68 year old woman who lives in an assisted living in [place]. She is a type II diabetic on insulin and a remote smoker. She tells [place] she had a wound on the right lateral ankle for up the past month. Not totally clear how this was discovered. She has been using some form of "white-cream" on this. She normally sleeps on her right side and keeps the right leg on a pillow. ABI on the right was 1.18 in this clinic clinic. The patient has a history of asthma, severe essential tremor status post placement of a brain stimulator, gastroesophageal reflux disease, osteoarthritis, supraventricular tachycardia status post attempts at ablation and a lumbar fusion. 03/02/18 on evaluation today patient actually appears to be doing excellent at this point in regard to her ankle ulcer. It seems to be showing signs of measuring just a little bit smaller and overall is cleaning up compared to previous as far as the slough on the surface of the wound is concerned. With that being said she does not appear to have any evidence of infection at this time which is good news. No fevers, chills, nausea, or vomiting noted at this time. 03/10/2018; right lateral malleolus ulcer. Using Iodoflex. She lives in an assisted living in [place] and has home health. 03/17/2018; right lateral malleolus ulcer. Using Iodoflex. She has home health and lives in assisted living in [place]. The wound  dimensions are not any better however the wound surface looks a lot better. Still requiring debridement. We will run puraply through insurance. 03/24/2018 right lateral malleolus ulcer. Presumably a pressure ulcer. We have been using Iodoflex. We have been able to get down to a healthy looking wound surface this is a deep worrisome wound clearly down to muscle layer with close proximity to bone. She has a co-pay with pure apply however she has Medicaid as a secondary. We will go ahead and order this for next time we use Prisma today. She does not appear to have an arterial issue 04/01/2018; right lateral malleolus ulcer. Presumably a pressure ulcer. The depth of this is not come in at all this week. Wound bed does not look too bad but it is precariously close to bone. Puraply #1 04/08/2018; right lateral malleolus ulcer. Presumably a pressure ulcer. Surface of the wound looks better although not much improvement in depth. Still precariously close to the underlying lateral malleolus itself. Puraply #2 04/15/2018; perhaps some improvement in the wound area but certainly not the volume. This is a deep wound on the right lateral malleolus. Puraply #3 not much improvement so far. 04/22/2018.; I really do not see much improvement here. Still a deep probing area. We had some initial improvement but I am not seeing much in the last month. The patient has a very robust dorsalis pedis and a palpable popliteal but I do not feel a posterior tibial pulse. There is generally no surrounding erythema. We have used 3 Puraply's but I do not see much improvement here. 04/29/18; small orifice but still a deep probing area. Puraply #5 applied. She did not get either x-ray or arterial  studies done as apparently the order did not make it to any pending radiology. I believe they actually sent the note that I sent to the assisted living where she lives 1/31; small orifice but still deep tunneling. Base of the wound looks  better. Puraply #6. X-ray was negative for osteomyelitis. Culture I did last time was also negative 05/18/18 on evaluation today patient actually appears to be doing well in regard to her lateral ankle ulcer on the right. Fortunately there's no sign of infection at this time. She's been tolerating the dressing changes without complication. We have been utilizing PuraPly Matrix. 2/20; right lateral malleolus. We have been using puraply. I have not seen this in a few weeks and actually this looks quite a bit better. 2/27; right lateral malleolus. We have been using puraply although we did not have one to apply today we actually used endoform. ARTERIAL STUDIES; ABI on the right at 1.03 triphasic waveforms. TBI at 1.20 both of which were normal. Study on the left was also fairly good no evidence of significant left lower extremity arterial disease 3/3; patient was brought back for reapplication of puraply. This is now a very small wound in terms of orifice but with 0.4 cm of depth and some undermining. This is not something that would be easy reopen. 3/13 still 0.4 cm in depth but without much undermining. Puraply #9 today 3/23; absolutely no improvement. Still 0.4 cm in depth with not much change in the overall appearance of this deep punched out area. I did not reapplied puraply #10 substituting endoform. 3/31; not a lot of improvement here. She still has depth with undermining. I have been using endoform on this. Visibly the wound is smaller with healthy tissue however there is not much granulation filling in the wound here. 4/9; really no improvement. There is still above 0.4 cm of depth with some undermining. The superficial surface area of the wound has gotten smaller but not the depth or undermining. I been using endoform. She had a full course of Puraply. The recent PCR culture I did last week only showed a small amount of Corynebacterium which is unlikely to be a significant pathogen 4/16;  small wound. 0.5 cm in depth. Too small to really determine the undermining but I think there probably is some. I have been using Iodoflex since last week. 4/30; she has an unmanageable co-pay for Grafix PL. 0.7 cm in depth absolutely no improvement. Still using Iodoflex. I am not certain whether the home health people are packing this gently into the wound 5/14; surprisingly the patient arrives in with a very small open area remaining on the right lateral malleolus. She is using Iodoflex we are offloading this with felt 5/28; the patient's depth is 0.4 cm which is probably a little more than last time at which time it looks surprisingly shallow. We have been using Iodoflex for about a month, I went back to endoform today. She claims she is offloading this religiously 6/11; wound is smaller but still with roughly 0.2 cm of depth. Endoform started last visit which seems to be helping. 6/25; the wound is closed over. Eschar removed there is no open wound. This was presumably a pressure ulcer at one point with considerable depth albeit a small wound. This took on a more nightly long period of time to get to fill- in. Ultimately it seemed to respond best to endoform. She did have a course of puraply READMISSION 01/06/2019 This patient is a type II diabetic  who we cared for in this clinic with a very difficult to close area over her right lateral malleolus. We eventually got this to close in June she wears support stockings. She comes back in after sustaining a fall 2 weeks ago. She tells me she scraped her leg anteriorly in the mid tibia. There is some surrounding blisters. They have been using Neosporin. There is some erythema and tenderness around the wound area. She has not been in hospital there is been no medical change. She is not felt to have an arterial issue last ABI in our clinic was 1.03 10/12; culture I did last week was negative. We have been using silver alginate under 3 layer  compression 10/26; the area on her right mid tibia is completely closed. She has what sounds like support stockings she wears. Electronic Signature(s) Signed: 01/30/2019 5:48:14 PM By: Linton Ham MD Entered By: Linton Ham on 01/30/2019 13:18:03 -------------------------------------------------------------------------------- Physical Exam Details Patient Name: Date of Service: [name], [name] 01/30/2019 10:15 AM Medical Record [id number] Patient Account Number: [id number] Date of Birth/Sex: Treating RN: 06/22/1950 (68 y.o. [name] Primary Care Provider: Abran Richard Other Clinician: Referring Provider: Treating Provider/Extender:Kamber Vignola, Kandice Hams, Karolee Ohs in Treatment: 3 Constitutional Patient is hypertensive.. Pulse regular and within target range for patient.Marland Kitchen Respirations regular, non-labored and within target range.. Temperature is normal and within the target range for the patient.Marland Kitchen Appears in no distress. Respiratory work of breathing is normal. Cardiovascular Pedal pulses are palpable. Psychiatric appears at normal baseline. Notes Wound exam; the patient has no open wound on the right leg. There is some swelling and she definitely needs stockings Electronic Signature(s) Signed: 01/30/2019 5:48:14 PM By: Linton Ham MD Entered By: Linton Ham on 01/30/2019 13:19:06 -------------------------------------------------------------------------------- Physician Orders Details Patient Name: Date of Service: [name], [name] 01/30/2019 10:15 AM Medical Record [id number] Patient Account Number: [id number] Date of Birth/Sex: Treating RN: [birth year] (68 y.o. [name] Primary Care Provider: Abran Richard Other Clinician: Referring Provider: Treating Provider/Extender:Esmerelda Finnigan, Kandice Hams, Karolee Ohs in Treatment: 3 Verbal / Phone Orders: No Diagnosis Coding ICD-10 Coding Code Description S81.811D Laceration  without foreign body, right lower leg, subsequent encounter L97.811 Non-pressure chronic ulcer of other part of right lower leg limited to breakdown of skin E11.622 Type 2 diabetes mellitus with other skin ulcer Discharge From [facility] Services Discharge from [place] Edema Control Elevate legs to the level of the heart or above for 30 minutes daily and/or when sitting, a frequency of: - throughout the day Support Garment 20-30 mm/Hg pressure to: - both legs daily [facility] - may d/c skilled nursing for wound care - wound healed Electronic Signature(s) Signed: 01/30/2019 5:48:14 PM By: Linton Ham MD Signed: 01/30/2019 6:04:10 PM By: Levan Hurst RN, BSN Entered By: Levan Hurst on 01/30/2019 11:04:50 -------------------------------------------------------------------------------- Problem List Details Patient Name: Date of Service: [name], [name] 01/30/2019 10:15 AM Medical Record [id number] Patient Account Number: [id number] Date of Birth/Sex: Treating RN: 1951/01/17 (68 y.o. Benjamine Sprague, Briant Cedar Primary Care Provider: Abran Richard Other Clinician: Referring Provider: Treating Provider/Extender:Katrina Brosh, Kandice Hams, Karolee Ohs in Treatment: 3 Active Problems ICD-10 Evaluated Encounter Code Description Active Date Today Diagnosis S81.811D Laceration without foreign body, right lower leg, 01/06/2019 No Yes subsequent encounter L97.811 Non-pressure chronic ulcer of other part of right lower 01/06/2019 No Yes leg limited to breakdown of skin E11.622 Type 2 diabetes mellitus with other skin ulcer 01/06/2019 No Yes Inactive Problems Resolved Problems Electronic Signature(s) Signed: 01/30/2019 5:48:14 PM By:  Linton Ham MD Entered By: Linton Ham on 01/30/2019 13:15:56 -------------------------------------------------------------------------------- Progress Note Details Patient Name: Date of Service: [name], [name]  01/30/2019 10:15 AM Medical Record [id number] Patient Account Number: [id number] Date of Birth/Sex: Treating RN: 1951/03/21 (68 y.o. [name] Primary Care Provider: Abran Richard Other Clinician: Referring Provider: Treating Provider/Extender:Adden Strout, Kandice Hams, Karolee Ohs in Treatment: 3 Subjective History of Present Illness (HPI) ADMISSION 02/14/2018 Mrs. Akamine is a 68 year old woman who lives in an assisted living in [place]. She is a type II diabetic on insulin and a remote smoker. She tells [place] she had a wound on the right lateral ankle for up the past month. Not totally clear how this was discovered. She has been using some form of "white-cream" on this. She normally sleeps on her right side and keeps the right leg on a pillow. ABI on the right was 1.18 in this clinic clinic. The patient has a history of asthma, severe essential tremor status post placement of a brain stimulator, gastroesophageal reflux disease, osteoarthritis, supraventricular tachycardia status post attempts at ablation and a lumbar fusion. 03/02/18 on evaluation today patient actually appears to be doing excellent at this point in regard to her ankle ulcer. It seems to be showing signs of measuring just a little bit smaller and overall is cleaning up compared to previous as far as the slough on the surface of the wound is concerned. With that being said she does not appear to have any evidence of infection at this time which is good news. No fevers, chills, nausea, or vomiting noted at this time. 03/10/2018; right lateral malleolus ulcer. Using Iodoflex. She lives in an assisted living in [place] and has home health. 03/17/2018; right lateral malleolus ulcer. Using Iodoflex. She has home health and lives in assisted living in [place]. The wound dimensions are not any better however the wound surface looks a lot better. Still requiring debridement. We will run puraply  through insurance. 03/24/2018 right lateral malleolus ulcer. Presumably a pressure ulcer. We have been using Iodoflex. We have been able to get down to a healthy looking wound surface this is a deep worrisome wound clearly down to muscle layer with close proximity to bone. She has a co-pay with pure apply however she has Medicaid as a secondary. We will go ahead and order this for next time we use Prisma today. She does not appear to have an arterial issue 04/01/2018; right lateral malleolus ulcer. Presumably a pressure ulcer. The depth of this is not come in at all this week. Wound bed does not look too bad but it is precariously close to bone. Puraply #1 04/08/2018; right lateral malleolus ulcer. Presumably a pressure ulcer. Surface of the wound looks better although not much improvement in depth. Still precariously close to the underlying lateral malleolus itself. Puraply #2 04/15/2018; perhaps some improvement in the wound area but certainly not the volume. This is a deep wound on the right lateral malleolus. Puraply #3 not much improvement so far. 04/22/2018.; I really do not see much improvement here. Still a deep probing area. We had some initial improvement but I am not seeing much in the last month. The patient has a very robust dorsalis pedis and a palpable popliteal but I do not feel a posterior tibial pulse. There is generally no surrounding erythema. We have used 3 Puraply's but I do not see much improvement here. 04/29/18; small orifice but still a deep probing area. Puraply #5 applied. She did not  get either x-ray or arterial studies done as apparently the order did not make it to any pending radiology. I believe they actually sent the note that I sent to the assisted living where she lives 1/31; small orifice but still deep tunneling. Base of the wound looks better. Puraply #6. X-ray was negative for osteomyelitis. Culture I did last time was also negative 05/18/18 on evaluation today  patient actually appears to be doing well in regard to her lateral ankle ulcer on the right. Fortunately there's no sign of infection at this time. She's been tolerating the dressing changes without complication. We have been utilizing PuraPly Matrix. 2/20; right lateral malleolus. We have been using puraply. I have not seen this in a few weeks and actually this looks quite a bit better. 2/27; right lateral malleolus. We have been using puraply although we did not have one to apply today we actually used endoform. ARTERIAL STUDIES; ABI on the right at 1.03 triphasic waveforms. TBI at 1.20 both of which were normal. Study on the left was also fairly good no evidence of significant left lower extremity arterial disease 3/3; patient was brought back for reapplication of puraply. This is now a very small wound in terms of orifice but with 0.4 cm of depth and some undermining. This is not something that would be easy reopen. 3/13 still 0.4 cm in depth but without much undermining. Puraply #9 today 3/23; absolutely no improvement. Still 0.4 cm in depth with not much change in the overall appearance of this deep punched out area. I did not reapplied puraply #10 substituting endoform. 3/31; not a lot of improvement here. She still has depth with undermining. I have been using endoform on this. Visibly the wound is smaller with healthy tissue however there is not much granulation filling in the wound here. 4/9; really no improvement. There is still above 0.4 cm of depth with some undermining. The superficial surface area of the wound has gotten smaller but not the depth or undermining. I been using endoform. She had a full course of Puraply. The recent PCR culture I did last week only showed a small amount of Corynebacterium which is unlikely to be a significant pathogen 4/16; small wound. 0.5 cm in depth. Too small to really determine the undermining but I think there probably is some. I have been using  Iodoflex since last week. 4/30; she has an unmanageable co-pay for Grafix PL. 0.7 cm in depth absolutely no improvement. Still using Iodoflex. I am not certain whether the home health people are packing this gently into the wound 5/14; surprisingly the patient arrives in with a very small open area remaining on the right lateral malleolus. She is using Iodoflex we are offloading this with felt 5/28; the patient's depth is 0.4 cm which is probably a little more than last time at which time it looks surprisingly shallow. We have been using Iodoflex for about a month, I went back to endoform today. She claims she is offloading this religiously 6/11; wound is smaller but still with roughly 0.2 cm of depth. Endoform started last visit which seems to be helping. 6/25; the wound is closed over. Eschar removed there is no open wound. This was presumably a pressure ulcer at one point with considerable depth albeit a small wound. This took on a more nightly long period of time to get to fill- in. Ultimately it seemed to respond best to endoform. She did have a course of puraply READMISSION 01/06/2019 This patient  is a type II diabetic who we cared for in this clinic with a very difficult to close area over her right lateral malleolus. We eventually got this to close in June she wears support stockings. She comes back in after sustaining a fall 2 weeks ago. She tells me she scraped her leg anteriorly in the mid tibia. There is some surrounding blisters. They have been using Neosporin. There is some erythema and tenderness around the wound area. She has not been in hospital there is been no medical change. She is not felt to have an arterial issue last ABI in our clinic was 1.03 10/12; culture I did last week was negative. We have been using silver alginate under 3 layer compression 10/26; the area on her right mid tibia is completely closed. She has what sounds like support stockings she  wears. Objective Constitutional Patient is hypertensive.. Pulse regular and within target range for patient.Marland Kitchen Respirations regular, non-labored and within target range.. Temperature is normal and within the target range for the patient.Marland Kitchen Appears in no distress. Vitals Time Taken: 10:24 AM, Height: 64 in, Weight: 130 lbs, BMI: 22.3, Temperature: 98.4 F, Pulse: 75 bpm, Respiratory Rate: 18 breaths/min, Blood Pressure: 146/94 mmHg. Respiratory work of breathing is normal. Cardiovascular Pedal pulses are palpable. Psychiatric appears at normal baseline. General Notes: Wound exam; the patient has no open wound on the right leg. There is some swelling and she definitely needs stockings Integumentary (Hair, Skin) Wound #3 status is Healed - Epithelialized. Original cause of wound was Trauma. The wound is located on the Right,Anterior Lower Leg. The wound measures 0cm length x 0cm width x 0cm depth; 0cm^2 area and 0cm^3 volume. There is no tunneling or undermining noted. There is a none present amount of drainage noted. The wound margin is flat and intact. There is no granulation within the wound bed. There is no necrotic tissue within the wound bed. Assessment Active Problems ICD-10 Laceration without foreign body, right lower leg, subsequent encounter Non-pressure chronic ulcer of other part of right lower leg limited to breakdown of skin Type 2 diabetes mellitus with other skin ulcer Plan Discharge From [facility] Services: Discharge from [place] Edema Control: Elevate legs to the level of the heart or above for 30 minutes daily and/or when sitting, a frequency of: - throughout the day Support Garment 20-30 mm/Hg pressure to: - both legs daily Home Health: [facility] - may d/c skilled nursing for wound care - wound healed 1. The patient can be discharged 2. She has support stockings by her description not the 20/30 stockings that are quoted above. She  definitely needs these as there is some swelling in the right leg. However the cause of this wound was traumatic. I do not think she needs any further compression Electronic Signature(s) Signed: 01/30/2019 5:48:14 PM By: Linton Ham MD Entered By: Linton Ham on 01/30/2019 13:19:55 -------------------------------------------------------------------------------- SuperBill Details Patient Name: Date of Service: [name], [name] 01/30/2019 Medical Record [id number] Patient Account Number: [id number] Date of Birth/Sex: Treating RN: [birth year] (68 y.o. [name] Primary Care Provider: Abran Richard Other Clinician: Referring Provider: Treating Provider/Extender:Kaleiyah Polsky, Kandice Hams, Karolee Ohs in Treatment: 3 Diagnosis Coding ICD-10 Codes Code Description [phone] Laceration without foreign body, right lower leg, subsequent encounter L97.811 Non-pressure chronic ulcer of other part of right lower leg limited to breakdown of skin E11.622 Type 2 diabetes mellitus with other skin ulcer Facility Procedures CPT4 Code: [id number] Description: 99213 - WOUND CARE VISIT-LEV 3 EST PT Modifier:  Quantity: 1 Physician Procedures CPT4 Code Description: [id number] - WC PHYS LEVEL 2 - EST PT ICD-10 Diagnosis Description S81.811D Laceration without foreign body, right lower leg, subseque L97.811 Non-pressure chronic ulcer of other part of right lower le skin Modifier: nt encounter g limited to breakdo Quantity: 1 wn of Electronic Signature(s) Signed: 01/30/2019 5:48:14 PM By: Linton Ham MD Entered By: Linton Ham on 01/30/2019 13:20:11

## 2019-02-01 ENCOUNTER — Other Ambulatory Visit: Payer: Self-pay

## 2019-02-01 ENCOUNTER — Ambulatory Visit (INDEPENDENT_AMBULATORY_CARE_PROVIDER_SITE_OTHER): Payer: Medicare Other | Admitting: Internal Medicine

## 2019-02-01 ENCOUNTER — Encounter: Payer: Self-pay | Admitting: Internal Medicine

## 2019-02-01 DIAGNOSIS — J4540 Moderate persistent asthma, uncomplicated: Secondary | ICD-10-CM

## 2019-02-01 DIAGNOSIS — K219 Gastro-esophageal reflux disease without esophagitis: Secondary | ICD-10-CM | POA: Diagnosis not present

## 2019-02-01 MED ORDER — DEXTROMETHORPHAN POLISTIREX ER 30 MG/5ML PO SUER: ORAL | 5 refills | Status: DC

## 2019-02-01 MED ORDER — DEXTROMETHORPHAN-GUAIFENESIN 5-100 MG/5ML PO LIQD: ORAL | 99 refills | Status: DC

## 2019-02-01 NOTE — Assessment & Plan Note (Signed)
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 Not clear if there is dementia associated.

## 2019-02-01 NOTE — Patient Instructions (Addendum)
Ok to continue current breathing meds.  Order- Delsym cough syrup to be given 10 ml by mouth every 12 hours, scheduled. May give extra dose every 4 hours if needed for cough, chest congestion.  Please call as needed

## 2019-02-01 NOTE — Progress Notes (Signed)
Patient ID: [name], female    DOB: [birth year], 68 y.o.   MRN: [id number]  HPI  female former smoker followed for Allergic rhinitis, Asthma, complicated by anxiety/VCD, CAD, GERD, DM2, tachycardia, tremor/deep brain stimulator, HBP, now living in assisted living Allergy Profile 01/10/2013-total IgE 166 with significant elevations for most common allergens except molds She had been on allergy vaccine years ago. Office Spirometry 12/13/2014-mild obstruction, mild restriction of forced vital capacity Upper endoscopy 2016-[name]'s ring and gastric erosions ----------------------------------------------------------   04/20/2018- 68 year old female former smoker followed for Allergic rhinitis, Asthma, complicated by anxiety/VCD, CAD, GERD, DM, tachycardia, tremor/deep brain stimulator, HBP, now living in assisted living/ [facility] Recent upper endoscopy-normal with dilatation done -----Asthma: Wheezing and congestion with cough-yellow in color.  Pro-air HFA, neb albuterol, Breo 100, Atrovent 0.03% nasal spray, Variable cough-not new.  She does not think she has an infection.  Has noted some tussive soreness right lower lateral rib area intermittently over the past month.  Soreness is associated with cough and does not bother her during sleep.  No fever, sore throat, blood or GI changes. Treated at wound center for pressure sore right lateral malleolus.  02/01/2019- Virtual Visit via Telephone Note  I connected with [name] on 02/01/19 at 11:00 AM EDT by telephone and verified that I am speaking with the correct person using two identifiers.  Location: Patient: [facility] Provider: O   I discussed the limitations, risks, security and privacy concerns of performing an evaluation and management service by telephone and the availability of in person appointments. I also discussed with the patient that there may be a patient responsible charge related to  this service. The patient expressed understanding and agreed to proceed.   History of Present Illness: 68 year old female former smoker followed for Allergic rhinitis, Asthma, complicated by anxiety/VCD, CAD, GERD, DM, tachycardia, tremor/deep brain stimulator, HBP, now living in assisted living/ [facility] upper endoscopy-normal with dilatation done -----pt states her breathing has worsened since LOV, increased shortness of breath, wheezing - more frequently having to take breathing treatments Televisit in July w NP gave augmentin w concern for aspiration. Was working w GI on choking and constipation.  had flu vax Says augmentin helped this summer. Denies fever now. Admits some choking with eating at times. Wheeze helped by her neb treatments. She asks that we order scheduled Delsym so she doesn't have to wait for it. Discussed.  Observations/Objective: Slow speech with no evident distress over phone. Not coughing.   Assessment and Plan: Asthma complicated by LPR- Plan continue nebs. Ok to try scheduled Delsym as she requests. Neurologic disorder w tremor- High risk for LPR/ aspiration- continue working with GI and Neurology. Follow Up Instructions: 6 months   I discussed the assessment and treatment plan with the patient. The patient was provided an opportunity to ask questions and all were answered. The patient agreed with the plan and demonstrated an understanding of the instructions.   The patient was advised to call back or seek an in-person evaluation if the symptoms worsen or if the condition fails to improve as anticipated.  I provided 21 minutes of non-face-to-face time during this encounter.   Baird Lyons, MD    ROS-see HPI    "+" = positive Constitutional:    weight loss, night sweats, fevers, chills, fatigue, lassitude. HEENT:    headaches, difficulty swallowing, tooth/dental problems, sore throat,       sneezing, itching, ear ache, nasal congestion, +post  nasal  drip, snoring CV:    chest pain, orthopnea, PND, swelling in lower extremities, anasarca,                                                 dizziness, palpitations Resp:   +shortness of breath with exertion or at rest.                +productive cough,    non-productive cough, coughing up of blood.              +change in color of mucus.  +wheezing.   Skin:    rash or lesions. GI:  + heartburn, indigestion, abdominal pain, nausea, vomiting, GU:  MS:   joint pain, stiffness, decreased range of motion, back pain. Neuro-    + chronic tremor, +unsteady/ walker Psych:  +change in mood or affect.  +depression or anxiety.   memory loss.  OBJ- Physical Exam General- Alert, Oriented, Affect-appropriate, Distress- none acute, talkative,+obese,           +wheelchair Skin- rash-none, lesions- none, excoriation- none Lymphadenopathy- none Head- atraumatic            Eyes- Gross vision intact, PERRLA, conjunctivae and secretions clear            Ears- Hearing, canals-normal            Nose- Clear, no-Septal dev, mucus, polyps, erosion, perforation             Throat- Mallampati II , mucosa -tongue slightly coated , drainage- none, tonsils-                  atrophic, + missing teeth    Neck- flexible , trachea midline, no stridor , thyroid nl, carotid no bruit Chest - symmetrical excursion , unlabored           Heart/CV- RRR , no murmur , no gallop  , no rub, nl s1 s2                           - JVD- none , edema- none, stasis changes- none, varices- none           Lung-  unlabored, wheeze -none, cough-none , dullness-none, rub- none, + few                           crackles right posterior base           Chest wall-+ pacemaker  (deep brain stimulator) left Abd-  Br/ Gen/ Rectal- Not done, not indicated Extrem- cyanosis- none, clubbing, none, atrophy- none, strength- nl,  Neuro- + tremor and head bob,+ mild dysphonia, + unsteady

## 2019-02-01 NOTE — Assessment & Plan Note (Signed)
She says she is "worse" but can't define. Nebs help. Doesn't have good motor control for inhalers. I agreed to her request to schedule Delsym. She wanted it every 4 hours, which would be too much on regular basis, but will ok occasional prn dose.

## 2019-02-01 NOTE — Assessment & Plan Note (Signed)
She is working with GI. Needs ongoing care with reflux precautions to minimize aspiration.

## 2019-02-02 ENCOUNTER — Telehealth: Payer: Self-pay | Admitting: Internal Medicine

## 2019-02-02 MED ORDER — DEXTROMETHORPHAN-GUAIFENESIN 5-100 MG/5ML PO LIQD: ORAL | 99 refills | Status: DC

## 2019-02-02 MED ORDER — DEXTROMETHORPHAN POLISTIREX ER 30 MG/5ML PO SUER: ORAL | 5 refills | Status: DC

## 2019-02-02 NOTE — Addendum Note (Signed)
Addended by: Vivia Ewing on: 02/02/2019 02:39 PM   Modules accepted: Orders

## 2019-02-02 NOTE — Telephone Encounter (Signed)
Call returned to [facility], Spoke with [employer]. Confirmed patient and DOB. Confirmed fax number. Aware the prescription has been faxed and if they do not receive it to give [place] a call back. Voiced understanding. Nothing further needed at this time.

## 2019-02-24 ENCOUNTER — Telehealth: Payer: Self-pay | Admitting: Internal Medicine

## 2019-02-24 NOTE — Telephone Encounter (Signed)
Spoke with med tech at [facility], states that pt is requesting to change her Delsym to be taken q12H, at 8am and 8pm. Med tech states she faxed a form to our office that must be signed and faxed back.  I could not locate this form on fax machine, in CY's cubby in C pod, or up front where faxes are separated. Called back [place] to request that form be refaxed.  Will await form.

## 2019-02-28 ENCOUNTER — Encounter: Payer: Self-pay | Admitting: Pulmonary Disease

## 2019-02-28 ENCOUNTER — Ambulatory Visit (INDEPENDENT_AMBULATORY_CARE_PROVIDER_SITE_OTHER): Payer: Medicare Other | Admitting: Pulmonary Disease

## 2019-02-28 DIAGNOSIS — R1310 Dysphagia, unspecified: Secondary | ICD-10-CM | POA: Diagnosis not present

## 2019-02-28 DIAGNOSIS — J4540 Moderate persistent asthma, uncomplicated: Secondary | ICD-10-CM

## 2019-02-28 DIAGNOSIS — J189 Pneumonia, unspecified organism: Secondary | ICD-10-CM

## 2019-02-28 MED ORDER — AMOXICILLIN-POT CLAVULANATE 875-125 MG PO TABS: 1.0000 | ORAL_TABLET | Freq: Two times a day (BID) | ORAL | 0 refills | Status: DC

## 2019-02-28 NOTE — Assessment & Plan Note (Signed)
Patient's worsened dysphagia is likely driving most of her symptoms today Difficult to fully assess as this is a telephonic visit Patient has not followed up with gastroenterology since September/2020 it was recommended that she follow-up in 6 months Patient reporting that for the last month she feels that she has had worsened swallowing Clinical staff at facility agrees that she has had worsened swallowing  Plan: Patient needs to schedule appointment with gastroenterology Will order chest x-ray today Will empirically start on Augmentin Clinical staff reports today that they will help the patient with getting established with gastroenterology

## 2019-02-28 NOTE — Telephone Encounter (Signed)
Found form up front in CY's inbox. Will have Aaron Edelman to sign the requested form and fax back to [facility]. I have already faxed over a copy of patient's AVS and CXR order from her televisit.   Will close this encounter.

## 2019-02-28 NOTE — Telephone Encounter (Signed)
Attempted to contact [facility] [place] [place].   Will route message to APP to see if they would be willing to sign order for Deslym 55ml every 12 hours per patient request. Will fax order once signed.   BM please advise. Thanks.

## 2019-02-28 NOTE — Telephone Encounter (Signed)
I am fine to sign that.  Wyn Quaker, FNP

## 2019-02-28 NOTE — Assessment & Plan Note (Signed)
Difficult to fully assess asthma symptoms Sounds like patient is having worsening aspiration Productive cough with discolored yellow mucus Wheezing  Plan: Augmentin today Continue Breo Ellipta 100 We will fax over order for chest x-ray Patient needs to follow-up with gastroenterology

## 2019-02-28 NOTE — Progress Notes (Signed)
Virtual Visit via Telephone Note  I connected with [name] on 02/28/19 at 10:30 AM EST by telephone and verified that I am speaking with the correct person using two identifiers.  Location: Patient: Pt located at [place].  Provider: [facility] - [id number], [place], [place], [place] [address]   I discussed the limitations, risks, security and privacy concerns of performing an evaluation and management service by telephone and the availability of in person appointments. I also discussed with the patient that there may be a patient responsible charge related to this service. The patient expressed understanding and agreed to proceed.  Patient consented to consult via telephone: Yes People present and their role in pt care: Pt   History of Present Illness:  68 year old female former smoker followed in our office for asthma and obstructive sleep apnea  Past medical history: Hypertension, hypothyroidism, GERD, IBS, hypokalemia, type 2 diabetes, depression, dysphagia Smoking history: Former smoker.  Quit 1995.  3-pack-year smoking history Maintenance: Breo 100 Patient of Dr. Annamaria Boots  Immunization History  Administered Date(s) Administered  . Influenza Split 12/17/2010, 02/16/2012, 12/27/2012, 11/29/2014, 01/04/2018  . Influenza Whole 12/16/2009  . Influenza, High Dose Seasonal PF 12/11/2016  . Influenza, Quadrivalent, Recombinant, Inj, Pf 01/04/2019  . Influenza,inj,Quad PF,6+ Mos 01/18/2013  . Influenza,inj,quad, With Preservative 01/04/2017  . Influenza-Unspecified 12/19/2013, 11/30/2015  . PPD Test 11/21/2014  . Pneumococcal Conjugate-13 03/02/2014  . Pneumococcal Polysaccharide-23 02/08/2004, 04/06/2010  . Tdap 11/21/2008  . Zoster 02/12/2014, 11/28/2014    Social History   Tobacco Use  Smoking Status Former Smoker  . Packs/day: 0.30  . Years: 10.00  . Pack years: 3.00  . Types: Cigarettes  . Quit date: 04/23/1993  . Years since  quitting: 25.8  Smokeless Tobacco Never Used  Tobacco Comment   quit smoking 25+yrs ago    Chief complaint:    68 year old female former smoker followed in our office for asthma.  Patient completing telephone visit with our office today due to worsened cough and congestion.  Patient was last evaluated in our office on 02/01/2019 this was a virtual visit.  Patient reporting today that she is had a worsened productive cough with yellow discolored mucus over the last week.  She feels that she is having worsened issues swallowing.  She has not followed up with gastroenterology for this.  She was recently treated with antibiotics with a Z-Pak.  She reports that she has not had any fevers.  She has no recent chest x-ray imaging.  She is currently not using her flutter valve.  Observations/Objective:  12/19/2018-SARS-CoV-2-negative  04/20/2018-CBC with differential-eosinophils relative 4.1, eosinophils absolute 0.2  11/28/2018-chest x-ray-no active cardiopulmonary disease  12/13/2014-spirometry-ratio 77, FEV1 1.7 (72% predicted), FVC 2.2 (71% predicted)  Assessment and Plan:  Asthma, moderate persistent, poorly-controlled Difficult to fully assess asthma symptoms Sounds like patient is having worsening aspiration Productive cough with discolored yellow mucus Wheezing  Plan: Augmentin today Continue Breo Ellipta 100 We will fax over order for chest x-ray Patient needs to follow-up with gastroenterology  CAP (community acquired pneumonia) High risk for aspiration pneumonia History of aspiration pneumonia  Plan: We will obtain chest x-ray today given symptoms Augmentin ordered  Dysphagia Patient's worsened dysphagia is likely driving most of her symptoms today Difficult to fully assess as this is a telephonic visit Patient has not followed up with gastroenterology since September/2020 it was recommended that she follow-up in 6 months Patient reporting that for the last month she feels  that  she has had worsened swallowing Clinical staff at facility agrees that she has had worsened swallowing  Plan: Patient needs to schedule appointment with gastroenterology Will order chest x-ray today Will empirically start on Augmentin Clinical staff reports today that they will help the patient with getting established with gastroenterology   Follow Up Instructions:  Return in about 5 months (around 07/29/2019), or if symptoms worsen or fail to improve, for Follow up with Dr. Annamaria Boots.   I discussed the assessment and treatment plan with the patient. The patient was provided an opportunity to ask questions and all were answered. The patient agreed with the plan and demonstrated an understanding of the instructions.   The patient was advised to call back or seek an in-person evaluation if the symptoms worsen or if the condition fails to improve as anticipated.  I provided 23 minutes of non-face-to-face time during this encounter.   Elizabeth Rinne, NP

## 2019-02-28 NOTE — Assessment & Plan Note (Signed)
High risk for aspiration pneumonia History of aspiration pneumonia  Plan: We will obtain chest x-ray today given symptoms Augmentin ordered

## 2019-02-28 NOTE — Patient Instructions (Addendum)
You were seen today by Lauraine Rinne, NP  for:   1. Asthma, moderate persistent, poorly-controlled  - amoxicillin-clavulanate (AUGMENTIN) 875-125 MG tablet; Take 1 tablet by mouth 2 (two) times daily.  Dispense: 14 tablet; Refill: 0  Breo Ellipta 100 >>> Take 1 puff daily in the morning right when you wake up >>>Rinse your mouth out after use >>>This is a daily maintenance inhaler, NOT a rescue inhaler >>>Contact our office if you are having difficulties affording or obtaining this medication >>>It is important for you to be able to take this daily and not miss any doses    2. Dysphagia, unspecified type  - amoxicillin-clavulanate (AUGMENTIN) 875-125 MG tablet; Take 1 tablet by mouth 2 (two) times daily.  Dispense: 14 tablet; Refill: 0 - DG Chest 2 View; Future  We will fax the printed chest x-ray order to: [phone] - fax  Attn kelly   I am concerned that you may have worsened aspiration.  I really would recommend that you get scheduled with gastroenterology as soon as possible.  3. Community acquired pneumonia of right lower lobe of lung  - amoxicillin-clavulanate (AUGMENTIN) 875-125 MG tablet; Take 1 tablet by mouth 2 (two) times daily.  Dispense: 14 tablet; Refill: 0 - DG Chest 2 View; Future  High risk for aspiration pneumonia Worsened productive cough  We will obtain chest x-ray today as well start empiric antibiotics Follow-up with gastroenterology as you are at high risk of aspiration  Use a flutter valve 10 breaths twice a day or 4 to 5 breaths 4-5 times a day to help clear mucus out    We recommend today:  Orders Placed This Encounter  Procedures  . DG Chest 2 View    Standing Status:   Future    Standing Expiration Date:   04/29/2020    Order Specific Question:   Reason for Exam (SYMPTOM  OR DIAGNOSIS REQUIRED)    Answer:   hx of aspiration pnu    Order Specific Question:   Preferred imaging location?    Answer:   External    Order Specific Question:    Radiology Contrast Protocol - do NOT remove file path    Answer:   [email or web address]   Orders Placed This Encounter  Procedures  . DG Chest 2 View   Meds ordered this encounter  Medications  . amoxicillin-clavulanate (AUGMENTIN) 875-125 MG tablet    Sig: Take 1 tablet by mouth 2 (two) times daily.    Dispense:  14 tablet    Refill:  0    Follow Up:    Return in about 5 months (around 07/29/2019), or if symptoms worsen or fail to improve, for Follow up with Dr. Annamaria Boots.   Please do your part to reduce the spread of COVID-19:      Reduce your risk of any infection  and COVID19 by using the similar precautions used for avoiding the common cold or flu:  Marland Kitchen Wash your hands often with soap and warm water for at least 20 seconds.  If soap and water are not readily available, use an alcohol-based hand sanitizer with at least 60% alcohol.  . If coughing or sneezing, cover your mouth and nose by coughing or sneezing into the elbow areas of your shirt or coat, into a tissue or into your sleeve (not your hands). Langley Gauss A MASK when in public  . Avoid shaking hands with others and consider head nods or verbal greetings only. . Avoid touching your  eyes, nose, or mouth with unwashed hands.  . Avoid close contact with people who are sick. . Avoid places or events with large numbers of people in one location, like concerts or sporting events. . If you have some symptoms but not all symptoms, continue to monitor at home and seek medical attention if your symptoms worsen. . If you are having a medical emergency, call 911.   [place] / e-Visit: [email or web address]         [facility]: [facility]: [id number]                   [facility]: [id number]     It is flu season:   >>> Best ways to  protect herself from the flu: Receive the yearly flu vaccine, practice good hand hygiene washing with soap and also using hand sanitizer when available, eat a nutritious meals, get adequate rest, hydrate appropriately   Please contact the office if your symptoms worsen or you have concerns that you are not improving.   Thank you for choosing [facility] for your healthcare, and for allowing [place] to partner with you on your healthcare journey. I am thankful to be able to provide care to you today.   Wyn Quaker FNP-C   Aspiration Pneumonia Aspiration pneumonia is an infection in the lungs. It occurs when saliva or liquid contaminated with bacteria is inhaled (aspirated) into the lungs. When these things get into the lungs, swelling (inflammation) and infection can occur. This can make it difficult to breathe. Aspiration pneumonia is a serious condition and can be life threatening. What are the causes? This condition is caused when saliva or liquid from the mouth, throat, or stomach is inhaled into the lungs, and when those fluids are contaminated with bacteria. What increases the risk? The following factors may make you more likely to develop this condition:  A narrowing of the tube that carries food to the stomach (esophageal narrowing).  Having gastroesophageal reflux disease (GERD).  Having a weak immune system.  Having diabetes.  Having poor oral hygiene.  Being malnourished. The condition is more likely to occur when a person's cough (gag) reflex, or ability to swallow, has decreased. Some things that can cause this decrease include:  Having a brain injury or disease, such as stroke, seizures, Parkinson disease, dementia, or amyotrophic lateral sclerosis (ALS).  Being given a general anesthetic for procedures.  Drinking too much alcohol. If a person passes out and vomits, vomit can be inhaled into the lungs.  Taking certain medicines, such as tranquilizers or  sedatives. What are the signs or symptoms? Symptoms of this condition include:  Fever.  A cough with secretions that are yellow, tan, or green.  Breathing problems, such as wheezing or shortness of breath.  Chest pain.  Being more tired than usual (fatigue).  Having a history of coughing while eating or drinking.  Bad breath.  Bluish color to the lips, skin, or fingers. How is this diagnosed? This condition may be diagnosed based on:  A physical exam.  Tests, such as: ? Chest X-ray. ? Sputum culture. Saliva and mucus (sputum) are collected from the lungs or the tubes that carry air to the lungs (bronchi). The sputum is then tested for bacteria. ? Oximetry. A sensor or clip is placed on areas such as a finger, earlobe, or toe to measure the oxygen level in your blood. ?  Blood tests. ? Swallowing study. This test looks at how food is swallowed and whether it goes into your breathing tube (trachea) or esophagus. ? Bronchoscopy. This test uses a flexible tube (bronchoscope) to see inside the lungs. How is this treated? This condition may be treated with:  Medicines. Antibiotic medicine will be given to kill the pneumonia bacteria. Other medicines may also be used to reduce fever or pain.  Breathing assistance and oxygen therapy. Depending on how well you are breathing, you may need to be given oxygen, or you may need breathing support from a breathing machine (ventilator).  Thoracentesis. This is a procedure to remove fluid that has built up in the space between the linings of the chest wall and the lungs.  Feeding tube and diet change. For people who have difficulty swallowing, a feeding tube might be placed in the stomach, or they may be asked to avoid certain food textures or liquids when eating. Follow these instructions at home: Medicines  Take over-the-counter and prescription medicines only as told by your health care provider. ? If you were prescribed an antibiotic  medicine, take it as told by your health care provider. Do not stop taking the antibiotic even if you start to feel better. ? Take cough medicine only if you are losing sleep. Cough medicine can prevent your body's natural ability to remove mucus from your lungs. General instructions  Carefully follow any eating instructions you were given, such as avoiding certain food textures or thickening your liquids. Thickening liquids reduces the risk of developing aspiration pneumonia again.  Use breathing exercises such as postural drainage, deep breathing, and incentive spirometry to help expel secretions.  Rest as instructed by your health care provider.  Sleep in a semi-upright position at night. Try to sleep in a reclining chair, or place a few pillows under your head.  Do not use any products that contain nicotine or tobacco, such as cigarettes and e-cigarettes. If you need help quitting, ask your health care provider.  Keep all follow-up visits as told by your health care provider. This is important. Contact a health care provider if:  You have a fever.  You have a worsening cough with yellow, tan, or green secretions.  You have coughing while eating or drinking. Get help right away if:  You have worsening shortness of breath, wheezing, or difficulty breathing.  You have chest pain. Summary  Aspiration pneumonia is an infection in the lungs. It is caused when saliva or liquid from the mouth, throat, or stomach is inhaled into the lungs.  Aspiration pneumonia is more likely to occur when a person's cough reflex or ability to swallow has decreased.  Symptoms of aspiration pneumonia include coughing, breathing problems, fever, and chest pain.  Aspiration pneumonia may be treated with antibiotic medicine, other medicines to reduce pain or fever, and breathing assistance or oxygen therapy. This information is not intended to replace advice given to you by your health care provider. Make  sure you discuss any questions you have with your health care provider. Document Released: 01/18/2009 Document Revised: 03/05/2017 Document Reviewed: 04/28/2016 Elsevier Patient Education  2020 [employer].

## 2019-03-07 ENCOUNTER — Ambulatory Visit (INDEPENDENT_AMBULATORY_CARE_PROVIDER_SITE_OTHER): Payer: Medicare Other | Admitting: Nurse Practitioner

## 2019-03-07 ENCOUNTER — Other Ambulatory Visit: Payer: Self-pay | Admitting: *Deleted

## 2019-03-07 ENCOUNTER — Encounter: Payer: Self-pay | Admitting: Nurse Practitioner

## 2019-03-07 ENCOUNTER — Other Ambulatory Visit: Payer: Self-pay

## 2019-03-07 VITALS — BP 154/82 | HR 85 | Temp 96.8°F | Ht 64.5 in | Wt 192.8 lb

## 2019-03-07 DIAGNOSIS — R1310 Dysphagia, unspecified: Secondary | ICD-10-CM | POA: Diagnosis not present

## 2019-03-07 DIAGNOSIS — K5903 Drug induced constipation: Secondary | ICD-10-CM

## 2019-03-07 DIAGNOSIS — K219 Gastro-esophageal reflux disease without esophagitis: Secondary | ICD-10-CM

## 2019-03-07 NOTE — Assessment & Plan Note (Signed)
History of dysphagia and [name]'s ring.  Recent EGD with dilation 12/19/2018.  At her last follow-up she was not having significant dysphagia symptoms.  However, recently had pulmonary she describes worsening dysphagia and it was felt that she had aspiration pneumonia.  Recommended return to GI it reduced interval to further evaluate.  She admits worsening symptoms with every meal.  Occasional pill dysphagia with large pills.  At this point given her recent EGD I will set her up for speech-language pathology evaluation and swallow evaluation for recommendations and risk for aspiration pneumonia.  Follow-up in 2 months.

## 2019-03-07 NOTE — Assessment & Plan Note (Signed)
GERD symptoms generally well managed on PPI.  Recommend she continue her medications and follow-up in 2 months.

## 2019-03-07 NOTE — Patient Instructions (Signed)
Your health issues we discussed today were:   Dysphagia (swallowing difficulties): 1. Continue to chew as adequately as possible 2. In the meantime eat a soft diet with foods that are easy to chew without your upper teeth 3. We will schedule an evaluation with speech-language pathology to evaluate your swallowing function 4. Further recommendations to follow  Constipation: 1. Start taking MiraLAX once a day, every day.  You can mix this into a beverage of your choice 2. If needed, you can increase this to twice a day every day 3. Call us if you have any worsening or severe constipation or significant bleeding in your stools  GERD (reflux/heartburn): 1. Continue taking Protonix (your acid blocker). 2. Because of you have any worsening or severe symptoms  Overall I recommend:  1. Continue your other current medications 2. Return for follow-up in 2 months 3. Call us if you have any questions or concerns.   Because of recent events of COVID-19 ("Coronavirus"), follow CDC recommendations:  Wash your hand frequently Avoid touching your face Stay away from people who are sick If you have symptoms such as fever, cough, shortness of breath then call your healthcare provider for further guidance If you are sick, STAY AT HOME unless otherwise directed by your healthcare provider. Follow directions from state and national officials regarding staying safe   At [facility] we value your feedback. You may receive a survey about your visit today. Please share your experience as we strive to create trusting relationships with our patients to provide genuine, compassionate, quality care.  We appreciate your understanding and patience as we review any laboratory studies, imaging, and other diagnostic tests that are ordered as we care for you. Our office policy is 5 business days for review of these results, and any emergent or urgent results are addressed in a timely manner for your  best interest. If you do not hear from our office in 1 week, please contact us.   We also encourage the use of MyChart, which contains your medical information for your review as well. If you are not enrolled in this feature, an access code is on this after visit summary for your convenience. Thank you for allowing [place] to be involved in your care.  It was great to see you today!  I hope you have a Merry Christmas!!

## 2019-03-07 NOTE — Progress Notes (Signed)
Referring Provider: Abran Richard, MD Primary Care Physician:  Abran Richard, MD Primary GI:  Dr. Gala Romney  Chief Complaint  Patient presents with   Dysphagia   Constipation    HPI:   [name] is a 68 y.o. female who presents for follow-up on dysphagia.  The patient was last seen in our office 12/21/2018 for GERD, dysphagia, drug-induced constipation.  History of chronic GERD, drug-induced constipation, history of esophageal stenosis.  Symptoms worse if she is rushed to eat.  Abdominal pain for 2 weeks started with posttussive cough.  "Bad reflux" about 4 times a week and not getting Protonix daily before breakfast.  EGD updated 12/19/2018 and at that time noted reflux well controlled on PPI, dysphagia improved but not resolved with resumption of acid suppression.  Surgical pathology of gastric biopsies found gastritis without histopathological changes.  Last visit she noted decreased appetite significantly improved on PPI with only 1 recurrence of dysphagia while eating potato chips.  Did not drink anything afterward and just "coughed them up."  Difficulty chewing due to no upper teeth currently but impressions have been made and dentures to be made soon.  MiraLAX for constipation which works well for her.  Colonoscopy up-to-date next due in 2021.  No other overt GI complaints.  Recommended continue PPI daily, obtain dentures as soon as possible, chewing and swallowing precautions reviewed, continue MiraLAX, follow-up in 6 months.  The patient recently saw pulmonary on 02/28/2019 for asthma, dysphagia, community-acquired pneumonia.  This was a virtual office visit due to COVID-19/coronavirus pandemic.  Asthma deemed moderately persistent, poorly controlled with difficulty assessing for symptoms although sounds like "having worsening aspiration" with productive cough and discolored mucus.  Recommended Augmentin, chest x-ray, follow-up with GI.  Patient felt to be having worsening  dysphagia driving most of her symptoms although difficult to assess at a telephone visit.  Reporting for the last month feels like she is having worsening swallowing and clinical staff where she lives agrees.  Recommended follow-up with GI.  Chest x-ray still pending.  No recent barium pill esophagram or speech therapy evaluation.  Today she states she's doing ok overall. Still doesn't have upper teeth. Trying to chew as good as able. Still with solid food dysphagia with every meal. Has a lot of coughing and coughs up food. Unsure when dentures will be completed. Occasional pill dysphagia with large pills, drinks plenty of water to get them to go down. Is using a straw. Having constipation, bowel movement about once every 3-4 days, hard stools and straining occasionally. Takes MiraLAX "as needed" for no bowel movement in a couple days. When she takes this it is effective. Denies hematochezia, melena. Admits intermittent RLQ abdominal discomfort which resolved with a bowel movement. Also known cyst right ovary. Denies N/V, fever, chills, unintentional weight loss. Denies URI or flu-like symptoms. Denies loss of sense of taste or smell. Denies chest pain, dyspnea, dizziness, lightheadedness, syncope, near syncope. Denies any other upper or lower GI symptoms.  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 [name], residual palpitations   Bipolar 1 disorder (  [place])    Chronic back pain    reason unknown   Constipation    COPD (chronic obstructive pulmonary disease) ([name])    DDD (degenerative disc disease), lumbar    Depression    takes Zoloft daily as well as  Cymbalta   Diabetes mellitus    Type II   Dysphagia    Dysrhythmia    SVT ==ablation done by Dr. Rayann Heman 2007   Esophageal reflux    takes Zantac daily   Frequent headaches    History of bronchitis Dec 2014   Hypertension    takes Losartan daily   Hypothyroidism    takes Synthroid daily   Joint pain    Joint swelling    left ankle   Low sodium syndrome    Lumbar radiculopathy    Seizures ([place]) 04-05-13   one time ran out of Primidone and had stopped it cold [place]   SVT (supraventricular tachycardia) ([name])    Tremor, essential    takes Mysoline and Neurontin daily.   Vocal cord dysfunction    Weakness    in left arm    Past Surgical History:  Procedure Laterality Date   ABDOMINAL HYSTERECTOMY     ABLATION FOR AVNRT     APPENDECTOMY     BARTHOLIN GLAND CYST EXCISION     x 2   BIOPSY  03/04/2015   Procedure: BIOPSY (Duodenal, Gastric);  Surgeon: Daneil Dolin, MD;  Location: [facility];  Service: Endoscopy;;   BIOPSY  12/19/2018   Procedure: BIOPSY;  Surgeon: Daneil Dolin, MD;  [facility];  Service: Endoscopy;;  Gastric Biopsies     BREAST SURGERY     CESAREAN SECTION     CHOLECYSTECTOMY     COLONOSCOPY  01/16/2004   [id number] rectum/colon   COLONOSCOPY WITH PROPOFOL N/A 03/04/2015   Dr.[name]- internal hemorrhoids, pancolonic diverticulosis, colonic polyp= tubular adenoma   DEEP BRAIN STIMULATOR PLACEMENT     DENTAL SURGERY     ESOPHAGEAL DILATION N/A 03/04/2015   Procedure: ESOPHAGEAL DILATION WITH 54FR MALONEY DILATOR;  Surgeon: Daneil Dolin, MD;  Location: [facility];  Service: Endoscopy;  Laterality: N/A;   ESOPHAGEAL DILATION  12/19/2018   Procedure: ESOPHAGEAL DILATION;  Surgeon: Daneil Dolin, MD;  [facility];  Service: Endoscopy;;   ESOPHAGOGASTRODUODENOSCOPY (EGD) WITH ESOPHAGEAL DILATION  04/03/2002   [id number] ring, otherwise normal esophagus, status post dilation with 77 F/Normal stomach     ESOPHAGOGASTRODUODENOSCOPY (EGD) WITH ESOPHAGEAL DILATION N/A 11/08/2012   Dr. [name]:schatzki's ring s/p dilation/hiatal hernia   ESOPHAGOGASTRODUODENOSCOPY (EGD) WITH PROPOFOL N/A 03/04/2015   Dr.[name]- schatzki's ring, hiatal hernia, scattered erosions bx= chronic inactive gastritis   ESOPHAGOGASTRODUODENOSCOPY (EGD) WITH PROPOFOL N/A 05/06/2017   Normal esophagus. Dilated. Small hiatal hernia. Normal duodenal bulb and second portion of duodenum.   ESOPHAGOGASTRODUODENOSCOPY (EGD) WITH PROPOFOL N/A 12/19/2018   Procedure: ESOPHAGOGASTRODUODENOSCOPY (EGD) WITH PROPOFOL;  Surgeon: Daneil Dolin, MD;  [facility];  Service: Endoscopy;  Laterality: N/A;  11:15am   FINGER SURGERY     HEMORRHOID BANDING  2017   Dr.[name]   LEFT ANKLE LIGAMENT REPAIR     x 2   LEFT ELBOW REPAIR     MALONEY DILATION N/A 05/06/2017   Procedure: MALONEY DILATION;  Surgeon: Daneil Dolin, MD;  [facility];  Service: Endoscopy;  Laterality: N/A;   MALONEY DILATION N/A 12/19/2018   Procedure: Venia Minks DILATION;  Surgeon: Daneil Dolin, MD;  [facility];  Service: Endoscopy;  Laterality: N/A;   MAXIMUM ACCESS (MAS)POSTERIOR LUMBAR INTERBODY FUSION (PLIF) 1 LEVEL N/A 04/24/2014   Procedure: Lumbar four-five Maximum access Surgery posterior lumbar interbody fusion;  Surgeon: [name], MD;  Location: [place] [facility];  Service: Neurosurgery;  Laterality: N/A;  Lumbar four-five Maximum access Surgery posterior lumbar interbody fusion   MULTIPLE EXTRACTIONS WITH ALVEOLOPLASTY N/A 06/18/2017   Procedure: MULTIPLE EXTRACTION;  Surgeon: Diona Browner, DDS;  Location: [place];  Service: Oral Surgery;  Laterality: N/A;   PARTIAL HYSTERECTOMY     POLYPECTOMY  03/04/2015   Procedure: POLYPECTOMY (descending colon);  Surgeon: Daneil Dolin, MD;  Location: [facility];  Service: Endoscopy;;   PULSE GENERATOR IMPLANT Bilateral 12/15/2013   Procedure: BILATERAL PULSE GENERATOR IMPLANT;   Surgeon: [name], MD;  Location: [place] [facility];  Service: Neurosurgery;  Laterality: Bilateral;  BILATERAL   RIGHT BREAST CYST     benign   SUBTHALAMIC STIMULATOR BATTERY REPLACEMENT Bilateral 06/24/2018   Procedure: Bilateral implantable pulse generator battery change;  Surgeon: [name], MD;  Location: [place];  Service: Neurosurgery;  Laterality: Bilateral;  bilateral   SUBTHALAMIC STIMULATOR INSERTION Bilateral 12/04/2013   Procedure: Bilateral Deep brain stimulator placement;  Surgeon: [name], MD;  Location: [place] [facility];  Service: Neurosurgery;  Laterality: Bilateral;  Bilateral Deep brain stimulator placement   TONSILLECTOMY      Current Outpatient Medications  Medication Sig Dispense Refill   acetaminophen (TYLENOL) 500 MG tablet Take 500 mg by mouth every 4 (four) hours as needed for mild pain, fever or headache.      albuterol (PROAIR HFA) 108 (90 Base) MCG/ACT inhaler Inhale 2 puffs every 6 hours for asthma - rescue inhaler (Patient taking differently: Inhale 2 puffs into the lungs every 6 (six) hours as needed for shortness of breath. ) 1 Inhaler 12   albuterol (PROVENTIL) (2.5 MG/3ML) 0.083% nebulizer solution Take 2.5 mg by nebulization every 6 (six) hours as needed for wheezing or shortness of breath.     alum & mag hydroxide-simeth (MAALOX/MYLANTA) 200-200-20 MG/5ML suspension Take 30 mLs by mouth as needed for indigestion or heartburn. Max 4 doses per 24 hours     amoxicillin-clavulanate (AUGMENTIN) 875-125 MG tablet Take 1 tablet by mouth 2 (two) times daily. 14 tablet 0   aspirin 81 MG chewable tablet Chew 81 mg by mouth daily.      baclofen (LIORESAL) 10 MG tablet Take 10 mg by mouth at bedtime.     divalproex (DEPAKOTE) 500 MG DR tablet Take 500 mg by mouth 3 (three) times daily.      docusate sodium (COLACE) 100 MG capsule Take 1 capsule (100 mg total) by mouth daily. 30 capsule 0   ENULOSE 10 GM/15ML SOLN Take 10 g by mouth daily as needed.       fluticasone furoate-vilanterol (BREO ELLIPTA) 100-25 MCG/INH AEPB Inhale 1 puff into the lungs daily. (Patient taking differently: Inhale 1 puff into the lungs daily. RINSE MOUTH WITH WATER AFTER USE) 1 each 11   gabapentin (NEURONTIN) 300 MG capsule Take 300-600 mg by mouth See admin instructions. Take 300 mg by mouth in the morning and 600 mg at bedtime     glucose 4 GM chewable tablet Chew 5 tablets by mouth as needed for low blood sugar.     HYDROcodone-acetaminophen (NORCO/VICODIN) 5-325 MG tablet Take 1 tablet by mouth 3 (three) times daily.     insulin glargine (LANTUS) 100 UNIT/ML injection Inject 15 Units into  the skin at bedtime. *Hold if blood glucose is less than 160*     ipratropium (ATROVENT) 0.03 % nasal spray 1-2 puffs each nostril 3 times daily, before meals (Patient taking differently: Place 1 spray into both nostrils 3 (three) times daily before meals. ) 30 mL 11   levothyroxine (SYNTHROID, LEVOTHROID) 75 MCG tablet TAKE (1) TABLET BY MOUTH ONCE DAILY BEFORE BREAKFAST. (Patient taking differently: Take 75 mcg by mouth daily before breakfast. ) 30 tablet 5   loperamide (IMODIUM A-D) 2 MG tablet Take 2 mg by mouth 4 (four) times daily as needed for diarrhea or loose stools.      magnesium hydroxide (MILK OF MAGNESIA) 400 MG/5ML suspension Take 30 mLs by mouth at bedtime as needed for mild constipation.      Menthol, Topical Analgesic, (BIOFREEZE) 4 % GEL Apply 1 application topically 2 (two) times daily. Apply to lower back and base of right thumb twice daily.     metoprolol succinate (TOPROL-XL) 50 MG 24 hr tablet Take 50 mg by mouth daily. Take with or immediately following a meal.     Multiple Vitamin (MULTIVITAMIN) capsule Take 1 capsule by mouth daily.     Multiple Vitamins-Minerals (MULTIVITAMINS THER. W/MINERALS) TABS tablet TAKE ONE TABLET BY MOUTH ONCE DAILY. (Patient taking differently: Take 1 tablet by mouth daily. ) 30 each 11   NOVOLOG FLEXPEN 100 UNIT/ML  FlexPen Inject 8 Units into the skin 3 (three) times daily before meals.      Omega 3 1000 MG CAPS TAKE (1) CAPSULE BY MOUTH ONCE DAILY. (Patient taking differently: Take 1,000 mg by mouth daily. ) 30 capsule 11   pantoprazole (PROTONIX) 40 MG tablet Take 40 mg by mouth daily.     PARoxetine (PAXIL) 10 MG tablet Take 10 mg by mouth daily.      Polyethyl Glycol-Propyl Glycol (SYSTANE ULTRA) 0.4-0.3 % SOLN Place 1 drop into both eyes 4 (four) times daily as needed (dry eyes).     polyethylene glycol (MIRALAX / GLYCOLAX) packet Take 17 g by mouth daily as needed for moderate constipation. 14 each 0   potassium chloride SA (K-DUR,KLOR-CON) 20 MEQ tablet Take 20 mEq by mouth daily.     primidone (MYSOLINE) 50 MG tablet Take 50 mg by mouth daily.      QUEtiapine (SEROQUEL) 100 MG tablet Take 100 mg by mouth at bedtime.     simvastatin (ZOCOR) 5 MG tablet Take 5 mg by mouth at bedtime.      sitaGLIPtin (JANUVIA) 100 MG tablet Take 100 mg by mouth daily.     sodium chloride (OCEAN) 0.65 % SOLN nasal spray Take 1-2 sprays in nostril every hour as needed for rhinitis (Patient taking differently: Place 1 spray into both nostrils every hour as needed for congestion. ) 30 mL PRN   sodium chloride 1 g tablet Take 1 g by mouth 2 (two) times daily.     sucralfate (CARAFATE) 1 g tablet Take 1 g by mouth 2 (two) times daily. ON AN EMPTY STOMACH     Carboxymethylcellulose Sod PF (REFRESH PLUS) 0.5 % SOLN Place 1 drop into both eyes 2 (two) times daily.      cephALEXin (KEFLEX) 500 MG capsule      clotrimazole-betamethasone (LOTRISONE) cream Apply 1 application topically 2 (two) times daily.     dextromethorphan (DELSYM) 30 MG/5ML liquid 10 ml scheduled every 12 hours. Extra dose every 4 hours if needed for cough 280 mL 5   Dextromethorphan-guaiFENesin 5-100 MG/5ML  LIQD 10 ml by mouth every 12 hours, and may take extra dose every 6 hours if needed 118 mL prn   hydrocortisone (PROCTOCORT) 1 % CREA  Apply 1 application topically 2 (two) times daily as needed (hemorrhoid flare).     ibuprofen (ADVIL,MOTRIN) 200 MG tablet Take 200 mg by mouth every 6 (six) hours as needed for fever, headache or moderate pain.      neomycin-bacitracin-polymyxin (NEOSPORIN) ointment Apply 1 application topically as needed for wound care.     Respiratory Therapy Supplies (FLUTTER) DEVI Use flutter device 3 times a day 1 each 0   No current facility-administered medications for this visit.     Allergies as of 03/07/2019 - Review Complete 03/07/2019  Allergen Reaction Noted   Invokana [canagliflozin] Other (See Comments) 12/05/2014   Losartan Other (See Comments) 01/22/2014   Metformin and related Diarrhea 01/18/2013   Latex Other (See Comments) 07/02/2014   Other Other (See Comments) 07/02/2014   Oxycodone-acetaminophen Itching and Other (See Comments) 05/11/2011   Mucinex [guaifenesin er] Other (See Comments)     Family History  Problem Relation Age of Onset   Lung cancer Father        DIED AGE 68 LUNG CA   Alcohol abuse Father    Anxiety disorder Father    Depression Father    COPD Mother        DIED AGE 50,MRSA,COPD,PNEUMONIA   Pneumonia Mother        DIED AGE 50,MRSA,COPD,PNEUMONIA   Supraventricular tachycardia Mother    Anxiety disorder Mother    Depression Mother    Alcohol abuse Mother    Heart disease Sister        DIED AGE 58, SMOKER,?HEART   COPD Brother        AGE 70   Colon cancer Neg Hx     Social History   Socioeconomic History   Marital status: Legally Separated    Spouse name: Not on file   Number of children: Not on file   Years of education: Not on file   Highest education level: Bachelor's degree (e.g., BA, AB, BS)  Occupational History   Occupation: retired  Scientist, product/process development strain: Not on file   Food insecurity    Worry: Not on file    Inability: Not on Lexicographer needs    Medical: Not on file     Non-medical: Not on file  Tobacco Use   Smoking status: Former Smoker    Packs/day: 0.30    Years: 10.00    Pack years: 3.00    Types: Cigarettes    Quit date: 04/23/1993    Years since quitting: 25.8   Smokeless tobacco: Never Used   Tobacco comment: quit smoking 25+yrs ago  Substance and Sexual Activity   Alcohol use: No    Alcohol/week: 0.0 standard drinks   Drug use: No   Sexual activity: Not Currently  Lifestyle   Physical activity    Days per week: Not on file    Minutes per session: Not on file   Stress: Not on file  Relationships   Social connections    Talks on phone: Not on file    Gets together: Not on file    Attends religious service: Not on file    Active member of club or organization: Not on file    Attends meetings of clubs or organizations: Not on file    Relationship status: Not on file  Other  Topics Concern   Not on file  Social History Narrative   Married, 1 daughte, living.  Lives with husband in [place], [place].   1 child died brain tumor at age 54.   Two grandchildren.   Works at [employer], patient currently lost her job.   Retired 2011.   No tobacco.   Alcohol: none in 30 yrs.  Distant history of heavy use.   No drug use.    Review of Systems: General: Negative for anorexia, weight loss, fever, chills, fatigue, weakness. ENT: Negative for hoarseness. Admits difficulty swallowing. CV: Negative for chest pain, angina, palpitations, peripheral edema.  Respiratory: Negative for dyspnea at rest, cough, sputum, wheezing.  GI: See history of present illness. Endo: Negative for unusual weight change.  Heme: Negative for bruising or bleeding. Allergy: Negative for rash or hives.   Physical Exam: BP (!) 154/82    Pulse 85    Temp (!) 96.8 F (36 C) (Temporal)    Ht 5' 4.5" (1.638 m)    Wt 192 lb 12.8 oz (87.5 kg)    BMI 32.58 kg/m  General:   Alert and oriented. Pleasant and cooperative. Well-nourished and well-developed. Slow  and slurred speech at baseline for this patient.  Eyes:  Without icterus, sclera clear and conjunctiva pink.  Ears:  Normal auditory acuity. Cardiovascular:  S1, S2 present without murmurs appreciated. Extremities without clubbing or edema. Respiratory:  Clear to auscultation bilaterally. No wheezes, rales, or rhonchi. No distress.  Gastrointestinal:  +BS, soft, and non-distended. Mild RLQ TTP. No HSM noted. No guarding or rebound. No masses appreciated.  Rectal:  Deferred  Musculoskalatal:  Symmetrical without gross deformities. Skin:  Intact without significant lesions or rashes. Neurologic:  Alert and oriented x4;  grossly normal neurologically. Psych:  Alert and cooperative. Normal mood and affect. Heme/Lymph/Immune: No excessive bruising noted.    03/07/2019 10:01 AM   Disclaimer: This note was dictated with voice recognition software. Similar sounding words can inadvertently be transcribed and may not be corrected upon review.

## 2019-03-07 NOTE — Assessment & Plan Note (Signed)
Noted worsening constipation with a bowel movement every 3 to 4 days with hard stools and straining.  She takes MiraLAX "as needed for no bowel movement in several days".  At this point I will have her start taking MiraLAX once daily.  She can increase this to twice daily if needed.  Follow-up in 2 months.

## 2019-03-08 ENCOUNTER — Telehealth: Payer: Self-pay | Admitting: Internal Medicine

## 2019-03-08 ENCOUNTER — Other Ambulatory Visit (HOSPITAL_COMMUNITY): Payer: Self-pay | Admitting: Specialist

## 2019-03-08 DIAGNOSIS — J690 Pneumonitis due to inhalation of food and vomit: Secondary | ICD-10-CM

## 2019-03-08 DIAGNOSIS — R1313 Dysphagia, pharyngeal phase: Secondary | ICD-10-CM

## 2019-03-08 MED ORDER — AZITHROMYCIN 250 MG PO TABS: ORAL_TABLET | ORAL | 0 refills | Status: DC

## 2019-03-08 NOTE — Telephone Encounter (Signed)
Ok Z pak   250 mg, # 6, 2 today then one daily 

## 2019-03-08 NOTE — Telephone Encounter (Signed)
Medication sent to [facility] pharmacy.   Call returned to [place], made aware the medication has been sent to pharmacy. Requested the order be sent to them as well. Faxed with confirmation received.   Nothing further needed at this time.

## 2019-03-08 NOTE — Telephone Encounter (Signed)
[facility] due to pt wanting them to call us. Pt stated that she finished the augmentin last night 12/1 but she is still coughing up green phlegm.  Pt is requesting to have a zpak sent to pharmacy as this usually works the best for her to help with her symptoms when she is in a flare.  Dr. Annamaria Boots, please advise if you are okay with [place] sending Zpak to pt's pharmacy.  Allergies  Allergen Reactions  . Invokana [Canagliflozin] Other (See Comments)    Yeast infectios  . Losartan Other (See Comments)    Angioedema   . Metformin And Related Diarrhea  . Latex Other (See Comments)    Other Reaction: redness, blisters  . Other Other (See Comments)    Air fresheners - on MAR  . Oxycodone-Acetaminophen Itching and Other (See Comments)    TYLOX. Caused internal itching per patient   . Mucinex [Guaifenesin Er] Other (See Comments)    MUCINEX REACTION: asthma attacks     Current Outpatient Medications:  .  acetaminophen (TYLENOL) 500 MG tablet, Take 500 mg by mouth every 4 (four) hours as needed for mild pain, fever or headache. , Disp: , Rfl:  .  albuterol (PROAIR HFA) 108 (90 Base) MCG/ACT inhaler, Inhale 2 puffs every 6 hours for asthma - rescue inhaler (Patient taking differently: Inhale 2 puffs into the lungs every 6 (six) hours as needed for shortness of breath. ), Disp: 1 Inhaler, Rfl: 12 .  albuterol (PROVENTIL) (2.5 MG/3ML) 0.083% nebulizer solution, Take 2.5 mg by nebulization every 6 (six) hours as needed for wheezing or shortness of breath., Disp: , Rfl:  .  alum & mag hydroxide-simeth (MAALOX/MYLANTA) [id number] MG/5ML suspension, Take 30 mLs by mouth as needed for indigestion or heartburn. Max 4 doses per 24 hours, Disp: , Rfl:  .  amoxicillin-clavulanate (AUGMENTIN) 875-125 MG tablet, Take 1 tablet by mouth 2 (two) times daily., Disp: 14 tablet, Rfl: 0 .  aspirin 81 MG chewable tablet, Chew 81 mg by mouth daily. , Disp: , Rfl:  .  baclofen (LIORESAL) 10 MG tablet, Take 10 mg  by mouth at bedtime., Disp: , Rfl:  .  Carboxymethylcellulose Sod PF (REFRESH PLUS) 0.5 % SOLN, Place 1 drop into both eyes 2 (two) times daily. , Disp: , Rfl:  .  cephALEXin (KEFLEX) 500 MG capsule, , Disp: , Rfl:  .  clotrimazole-betamethasone (LOTRISONE) cream, Apply 1 application topically 2 (two) times daily., Disp: , Rfl:  .  dextromethorphan (DELSYM) 30 MG/5ML liquid, 10 ml scheduled every 12 hours. Extra dose every 4 hours if needed for cough, Disp: 280 mL, Rfl: 5 .  Dextromethorphan-guaiFENesin 5-100 MG/5ML LIQD, 10 ml by mouth every 12 hours, and may take extra dose every 6 hours if needed, Disp: 118 mL, Rfl: prn .  divalproex (DEPAKOTE) 500 MG DR tablet, Take 500 mg by mouth 3 (three) times daily. , Disp: , Rfl:  .  docusate sodium (COLACE) 100 MG capsule, Take 1 capsule (100 mg total) by mouth daily., Disp: 30 capsule, Rfl: 0 .  ENULOSE 10 GM/15ML SOLN, Take 10 g by mouth daily as needed. , Disp: , Rfl:  .  fluticasone furoate-vilanterol (BREO ELLIPTA) 100-25 MCG/INH AEPB, Inhale 1 puff into the lungs daily. (Patient taking differently: Inhale 1 puff into the lungs daily. RINSE MOUTH WITH WATER AFTER USE), Disp: 1 each, Rfl: 11 .  gabapentin (NEURONTIN) 300 MG capsule, Take 300-600 mg by mouth See admin instructions. Take 300 mg by mouth  in the morning and 600 mg at bedtime, Disp: , Rfl:  .  glucose 4 GM chewable tablet, Chew 5 tablets by mouth as needed for low blood sugar., Disp: , Rfl:  .  HYDROcodone-acetaminophen (NORCO/VICODIN) 5-325 MG tablet, Take 1 tablet by mouth 3 (three) times daily., Disp: , Rfl:  .  hydrocortisone (PROCTOCORT) 1 % CREA, Apply 1 application topically 2 (two) times daily as needed (hemorrhoid flare)., Disp: , Rfl:  .  ibuprofen (ADVIL,MOTRIN) 200 MG tablet, Take 200 mg by mouth every 6 (six) hours as needed for fever, headache or moderate pain. , Disp: , Rfl:  .  insulin glargine (LANTUS) 100 UNIT/ML injection, Inject 15 Units into the skin at bedtime. *Hold if  blood glucose is less than 160*, Disp: , Rfl:  .  ipratropium (ATROVENT) 0.03 % nasal spray, 1-2 puffs each nostril 3 times daily, before meals (Patient taking differently: Place 1 spray into both nostrils 3 (three) times daily before meals. ), Disp: 30 mL, Rfl: 11 .  levothyroxine (SYNTHROID, LEVOTHROID) 75 MCG tablet, TAKE (1) TABLET BY MOUTH ONCE DAILY BEFORE BREAKFAST. (Patient taking differently: Take 75 mcg by mouth daily before breakfast. ), Disp: 30 tablet, Rfl: 5 .  loperamide (IMODIUM A-D) 2 MG tablet, Take 2 mg by mouth 4 (four) times daily as needed for diarrhea or loose stools. , Disp: , Rfl:  .  magnesium hydroxide (MILK OF MAGNESIA) 400 MG/5ML suspension, Take 30 mLs by mouth at bedtime as needed for mild constipation. , Disp: , Rfl:  .  Menthol, Topical Analgesic, (BIOFREEZE) 4 % GEL, Apply 1 application topically 2 (two) times daily. Apply to lower back and base of right thumb twice daily., Disp: , Rfl:  .  metoprolol succinate (TOPROL-XL) 50 MG 24 hr tablet, Take 50 mg by mouth daily. Take with or immediately following a meal., Disp: , Rfl:  .  Multiple Vitamin (MULTIVITAMIN) capsule, Take 1 capsule by mouth daily., Disp: , Rfl:  .  Multiple Vitamins-Minerals (MULTIVITAMINS THER. W/MINERALS) TABS tablet, TAKE ONE TABLET BY MOUTH ONCE DAILY. (Patient taking differently: Take 1 tablet by mouth daily. ), Disp: 30 each, Rfl: 11 .  neomycin-bacitracin-polymyxin (NEOSPORIN) ointment, Apply 1 application topically as needed for wound care., Disp: , Rfl:  .  NOVOLOG FLEXPEN 100 UNIT/ML FlexPen, Inject 8 Units into the skin 3 (three) times daily before meals. , Disp: , Rfl:  .  Omega 3 1000 MG CAPS, TAKE (1) CAPSULE BY MOUTH ONCE DAILY. (Patient taking differently: Take 1,000 mg by mouth daily. ), Disp: 30 capsule, Rfl: 11 .  pantoprazole (PROTONIX) 40 MG tablet, Take 40 mg by mouth daily., Disp: , Rfl:  .  PARoxetine (PAXIL) 10 MG tablet, Take 10 mg by mouth daily. , Disp: , Rfl:  .   Polyethyl Glycol-Propyl Glycol (SYSTANE ULTRA) 0.4-0.3 % SOLN, Place 1 drop into both eyes 4 (four) times daily as needed (dry eyes)., Disp: , Rfl:  .  polyethylene glycol (MIRALAX / GLYCOLAX) packet, Take 17 g by mouth daily as needed for moderate constipation., Disp: 14 each, Rfl: 0 .  potassium chloride SA (K-DUR,KLOR-CON) 20 MEQ tablet, Take 20 mEq by mouth daily., Disp: , Rfl:  .  primidone (MYSOLINE) 50 MG tablet, Take 50 mg by mouth daily. , Disp: , Rfl:  .  QUEtiapine (SEROQUEL) 100 MG tablet, Take 100 mg by mouth at bedtime., Disp: , Rfl:  .  Respiratory Therapy Supplies (FLUTTER) DEVI, Use flutter device 3 times a day, Disp: 1 each,  Rfl: 0 .  simvastatin (ZOCOR) 5 MG tablet, Take 5 mg by mouth at bedtime. , Disp: , Rfl:  .  sitaGLIPtin (JANUVIA) 100 MG tablet, Take 100 mg by mouth daily., Disp: , Rfl:  .  sodium chloride (OCEAN) 0.65 % SOLN nasal spray, Take 1-2 sprays in nostril every hour as needed for rhinitis (Patient taking differently: Place 1 spray into both nostrils every hour as needed for congestion. ), Disp: 30 mL, Rfl: PRN .  sodium chloride 1 g tablet, Take 1 g by mouth 2 (two) times daily., Disp: , Rfl:  .  sucralfate (CARAFATE) 1 g tablet, Take 1 g by mouth 2 (two) times daily. ON AN EMPTY STOMACH, Disp: , Rfl:

## 2019-03-12 IMAGING — DX DG ANKLE 2V *R*
2 series · 2 of 2 positions shown · non-contrast
Comparison: None.

CLINICAL DATA: Right ankle chronic ulcer.

EXAM:
RIGHT ANKLE - 2 VIEW

[ankle ap]
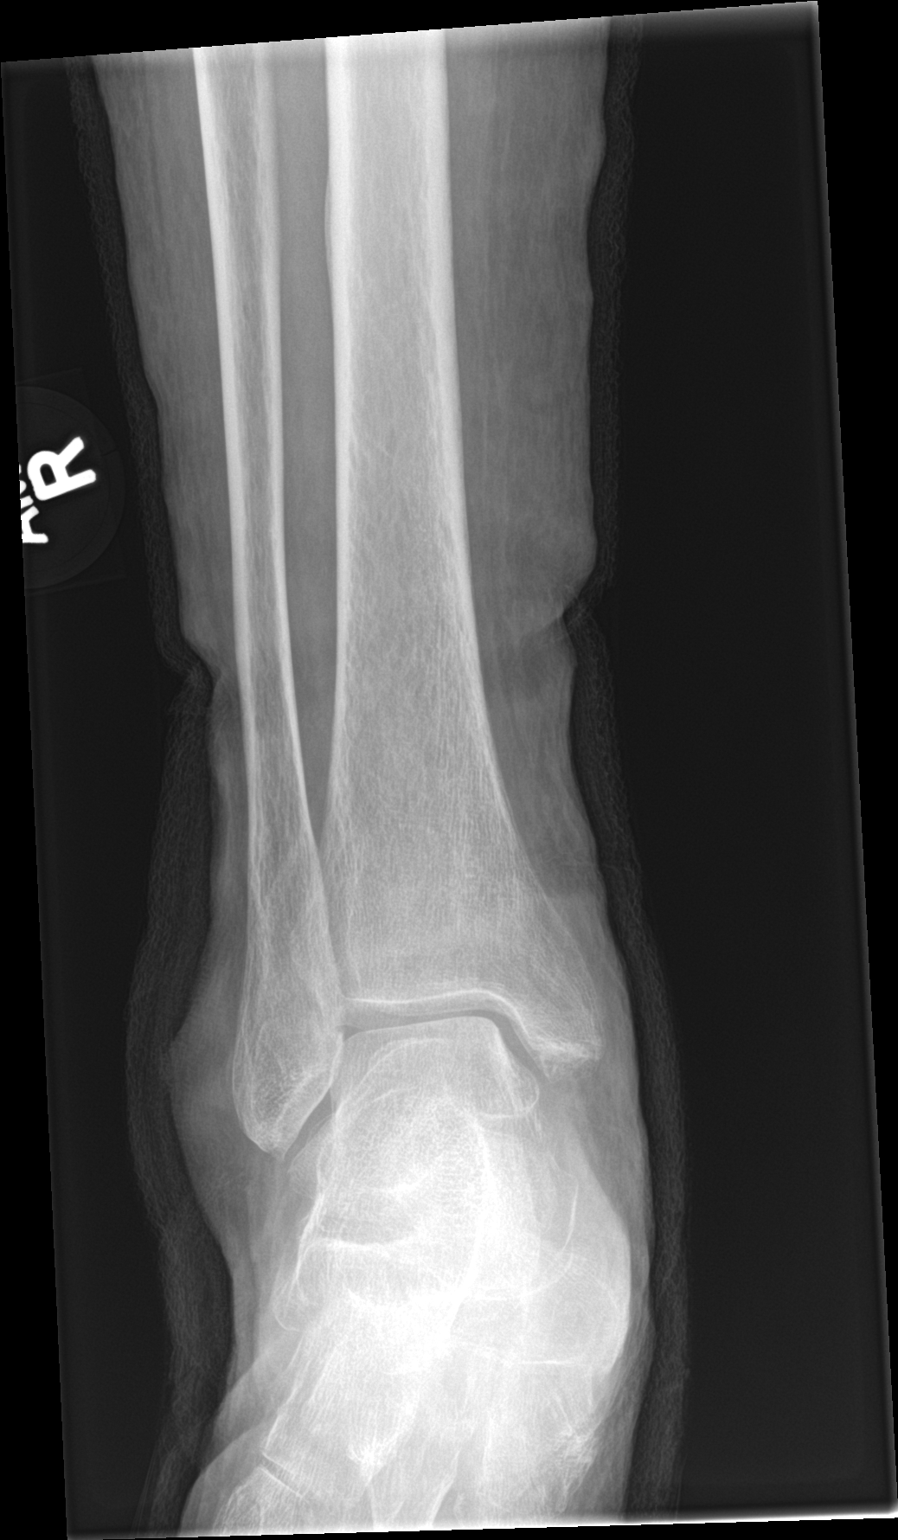

[ankle lat]
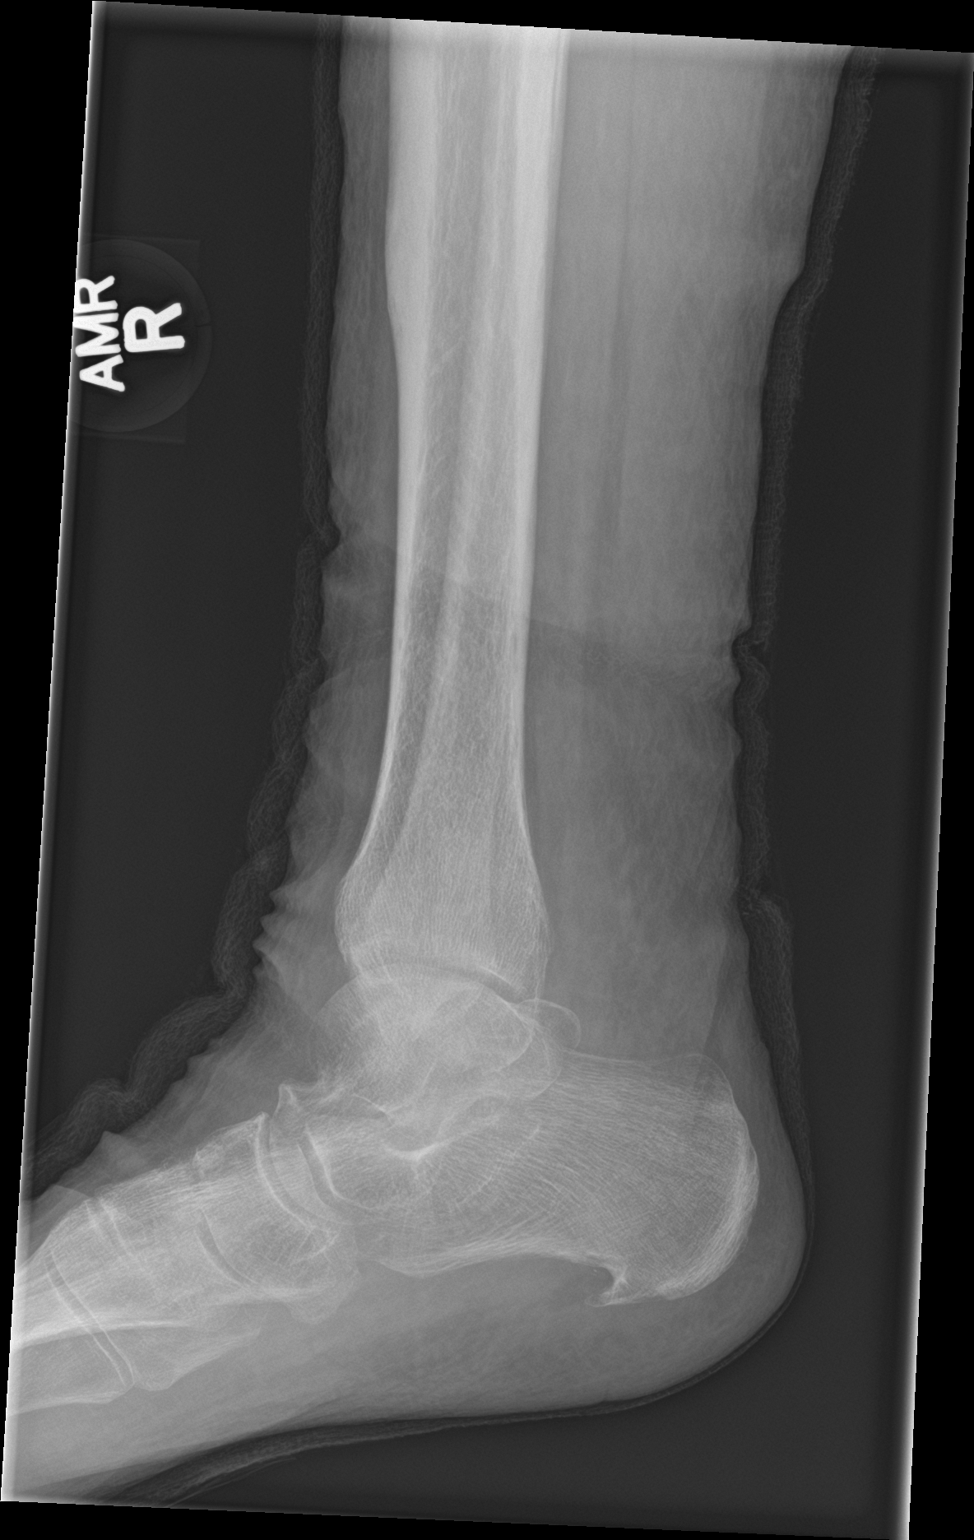

[2 of 2 positions shown; findings below may reference images not displayed]

FINDINGS: There is no evidence of fracture, dislocation, or joint effusion.
There is no evidence of arthropathy or other focal bone abnormality.
No radiopaque foreign body is noted. No definite ulceration is
identified, although may be obscured by overlying bandages.
IMPRESSION: No lytic destruction is seen to suggest osteomyelitis.

## 2019-03-14 NOTE — Progress Notes (Signed)
HEAVIN, [name] ([id number]) Visit Report for 01/16/2019 Arrival Information Details Patient Name: Date of Service: [name], [name] 01/16/2019 10:15 AM Medical Record [id number] Patient Account Number: [id number] Date of Birth/Sex: Treating RN: 04-26-1950 (68 y.o. [name] Primary Care [name]: [name] Other Clinician: Referring [name]: Treating [name]/Extender:[name], [name] in Treatment: 1 Visit Information History Since Last Visit All ordered tests and consults were completed: No Patient Arrived: Wheel Chair Added or deleted any medications: No Arrival Time: 10:40 Any new allergies or adverse reactions: No Accompanied By: self Had a fall or experienced change in No activities of daily living that may affect Transfer Assistance: None risk of falls: Patient Identification Verified: Yes Signs or symptoms of abuse/neglect since last No Secondary Verification Process Completed: Yes visito Patient Requires Transmission-Based No Hospitalized since last visit: No Precautions: Implantable device outside of the clinic excluding No Patient Has Alerts: Yes cellular tissue based products placed in the center Patient Alerts: R ABI = since last visit: 1.03 Has Dressing in Place as Prescribed: Yes Has Compression in Place as Prescribed: Yes Pain Present Now: No Electronic Signature(s) Signed: 03/14/2019 3:01:15 PM By: Elizabeth Coria RN Entered By: [name] on 01/16/2019 10:40:34 -------------------------------------------------------------------------------- Compression Therapy Details Patient Name: Date of Service: [name], [name] 01/16/2019 10:15 AM Medical Record [id number] Patient Account Number: [id number] Date of Birth/Sex: Treating RN: [birth year] (68 y.o. [name] Primary Care Alveena Taira: [name] Other Clinician: Referring Francia Verry: Treating [name]/Extender:[name],  [name] in Treatment: 1 Compression Therapy Performed for Wound Wound #3 Right,Anterior Lower Leg Assessment: Performed By: Clinician Elizabeth Millin, RN Compression Type: Three Layer Post Procedure Diagnosis Same as Pre-procedure Electronic Signature(s) Signed: 01/16/2019 5:18:03 PM By: [name] Entered By: [name] on 01/16/2019 11:04:10 -------------------------------------------------------------------------------- Encounter Discharge Information Details Patient Name: Date of Service: [name], [name] 01/16/2019 10:15 AM Medical Record [id number] Patient Account Number: [id number] Date of Birth/Sex: Treating RN: [birth year] (68 y.o. [name] Primary Care Toshiye Kever: [name] Other Clinician: Referring Reilley Latorre: Treating Cedric Denison/Extender:[name], [name] in Treatment: 1 Encounter Discharge Information Items Discharge Condition: Stable Ambulatory Status: Wheelchair Discharge Destination: Skilled Nursing Facility Telephoned: No Orders Sent: Yes Transportation: Private Auto Accompanied By: self Schedule Follow-up Appointment: Yes Clinical Summary of Care: Electronic Signature(s) Signed: 01/16/2019 6:01:15 PM By: [name] Entered By: [name] on 01/16/2019 11:31:23 -------------------------------------------------------------------------------- Lower Extremity Assessment Details Patient Name: Date of Service: [name], [name] 01/16/2019 10:15 AM Medical Record [id number] Patient Account Number: [id number] Date of Birth/Sex: Treating RN: May 20, 1950 (68 y.o. [name] Primary Care Ajane Novella: [name] Other Clinician: Referring Nareg Breighner: Treating Zaylyn Bergdoll/Extender:[name], [name] in Treatment: 1 Edema Assessment Assessed: [Left: No] [Right: No] Edema: [Left: Ye] [Right: s] Calf Left: Right: Point of Measurement: 31 cm From Medial Instep cm 31.5  cm Ankle Left: Right: Point of Measurement: 9 cm From Medial Instep cm 20.5 cm Electronic Signature(s) Signed: 03/14/2019 3:01:15 PM By: Elizabeth Coria RN Entered By: [name] on 01/16/2019 10:41:21 -------------------------------------------------------------------------------- Multi Wound Chart Details Patient Name: Date of Service: [name] 01/16/2019 10:15 AM Medical Record [id number] Patient Account Number: [id number] Date of Birth/Sex: Treating RN: [birth year] (68 y.o. [name] Primary Care Marsalis Beaulieu: [name] Other Clinician: Referring Future Yeldell: Treating Marcellis Frampton/Extender:[name], [name] in Treatment: 1 Vital Signs Height(in): 64 Pulse(bpm): 80 Weight(lbs): 130 Blood Pressure(mmHg): 172/90 Body Mass Index(BMI): 22 Temperature(F): 98.4 Respiratory 18 Rate(breaths/min): Photos: [3:No Photos] [N/A:N/A] Wound Location: [3:Right Lower Leg - Anterior] [N/A:N/A] Wounding  Event: [3:Trauma] [N/A:N/A] Primary Etiology: [3:Venous Leg Ulcer] [N/A:N/A] Comorbid History: [3:Asthma, Chronic Obstructive Pulmonary Disease (COPD), Sleep Apnea, Arrhythmia, Hypertension, Type II Diabetes, Osteoarthritis, Neuropathy, Seizure Disorder] [N/A:N/A] Date Acquired: [3:12/19/2018] [N/A:N/A] Weeks of Treatment: [3:1] [N/A:N/A] Wound Status: [3:Open] [N/A:N/A] Measurements L x W x D 2.5x1.6x0.1 [N/A:N/A] (cm) Area (cm) : [3:3.142] [N/A:N/A] Volume (cm) : [3:0.314] [N/A:N/A] % Reduction in Area: [3:-212.60%] [N/A:N/A] % Reduction in Volume: [3:-210.90%] [N/A:N/A] Classification: [3:Full Thickness Without Exposed Support Structures] [N/A:N/A] Exudate Amount: [3:Small] [N/A:N/A] Exudate Type: [3:Serosanguineous] [N/A:N/A] Exudate Color: [3:red, brown] [N/A:N/A] Wound Margin: [3:Flat and Intact] [N/A:N/A] Granulation Amount: [3:Large (67-100%)] [N/A:N/A] Granulation Quality: [3:Red] [N/A:N/A] Necrotic Amount: [3:None Present (0%)]  [N/A:N/A] Exposed Structures: [3:Fat Layer (Subcutaneous N/A Tissue) Exposed: Yes Fascia: No Tendon: No Muscle: No Joint: No Bone: No] Epithelialization: [3:None Compression Therapy] [N/A:N/A N/A] Treatment Notes Wound #3 (Right, Anterior Lower Leg) 1. Cleanse With Wound Cleanser Soap and water 2. Periwound Care Moisturizing lotion 3. Primary Dressing Applied Calcium Alginate Ag 4. Secondary Dressing Dry Gauze 6. Support Layer Applied 3 layer compression wrap Notes netting. Electronic Signature(s) Signed: 01/16/2019 5:50:55 PM By: Linton Ham MD Signed: 01/17/2019 5:52:09 PM By: Levan Hurst RN, BSN Entered By: Linton Ham on 01/16/2019 15:38:55 -------------------------------------------------------------------------------- Multi-Disciplinary Care Plan Details Patient Name: Date of Service: [name], [name] 01/16/2019 10:15 AM Medical Record [id number] Patient Account Number: [id number] Date of Birth/Sex: Treating RN: [birth year] (68 y.o. [name] Primary Care Rajvi Armentor: [name] Other Clinician: Referring Anjelo Pullman: Treating Annalisia Ingber/Extender:[name], [name] in Treatment: 1 Active Inactive Abuse / Safety / Falls / Self Care Management Nursing Diagnoses: History of Falls Potential for falls Goals: Patient will remain injury free related to falls Date Initiated: 01/06/2019 Target Resolution Date: 02/03/2019 Goal Status: Active Patient/caregiver will verbalize/demonstrate measures taken to prevent injury and/or falls Date Initiated: 01/06/2019 Target Resolution Date: 02/03/2019 Goal Status: Active Interventions: Assess fall risk on admission and as needed Assess impairment of mobility on admission and as needed per policy Notes: Venous Leg Ulcer Nursing Diagnoses: Knowledge deficit related to disease process and management Potential for venous Insuffiency (use before diagnosis confirmed) Goals: Patient will  maintain optimal edema control Date Initiated: 01/06/2019 Target Resolution Date: 02/03/2019 Goal Status: Active Interventions: Assess peripheral edema status every visit. Compression as ordered Provide education on venous insufficiency Treatment Activities: Therapeutic compression applied : 01/06/2019 Notes: Wound/Skin Impairment Nursing Diagnoses: Impaired tissue integrity Knowledge deficit related to ulceration/compromised skin integrity Goals: Patient/caregiver will verbalize understanding of skin care regimen Date Initiated: 01/06/2019 Target Resolution Date: 02/03/2019 Goal Status: Active Ulcer/skin breakdown will have a volume reduction of 30% by week 4 Date Initiated: 01/06/2019 Target Resolution Date: 02/03/2019 Goal Status: Active Interventions: Assess patient/caregiver ability to obtain necessary supplies Assess patient/caregiver ability to perform ulcer/skin care regimen upon admission and as needed Assess ulceration(s) every visit Notes: Electronic Signature(s) Signed: 01/16/2019 5:18:03 PM By: [name] Entered By: [name] on 01/16/2019 10:48:36 -------------------------------------------------------------------------------- Pain Assessment Details Patient Name: Date of Service: [name], [name] 01/16/2019 10:15 AM Medical Record [id number] Patient Account Number: [id number] Date of Birth/Sex: Treating RN: 1950-12-02 (68 y.o. [name] Primary Care Destony Prevost: [name] Other Clinician: Referring Evanie Buckle: Treating Raeann Offner/Extender:[name], [name] in Treatment: 1 Active Problems Location of Pain Severity and Description of Pain Patient Has Paino No Site Locations Pain Management and Medication Current Pain Management: Electronic Signature(s) Signed: 03/14/2019 3:01:15 PM By: Elizabeth Coria RN Entered By: [name] on 01/16/2019  10:41:05 -------------------------------------------------------------------------------- Patient/Caregiver Education Details Patient Name: Date of  Service: [name], [name] 10/12/2020andnbsp10:15 AM Medical Record Patient Account Number: [id number] Number: Treating RN: [name] Date of Birth/Gender: 03-Jun-1950 (68 y.o. F) Other Clinician: Primary Care Physician: Roxanne Mins Referring Physician: Physician/Extender: Cecelia Byars in Treatment: 1 Education Assessment Education Provided To: Patient Education Topics Provided Venous: Methods: Explain/Verbal Responses: State content correctly Electronic Signature(s) Signed: 01/16/2019 5:18:03 PM By: [name] Entered By: [name] on 01/16/2019 10:48:50 -------------------------------------------------------------------------------- Wound Assessment Details Patient Name: Date of Service: [name], [name] 01/16/2019 10:15 AM Medical Record [id number] Patient Account Number: [id number] Date of Birth/Sex: Treating RN: 1950/06/01 (68 y.o. [name] Primary Care Zahava Quant: [name] Other Clinician: Referring Kehlani Vancamp: Treating Hadrian Yarbrough/Extender:[name], [name] in Treatment: 1 Wound Status Wound Number: 3 Primary Venous Leg Ulcer Etiology: Wound Location: Right Lower Leg - Anterior Wound Open Wounding Event: Trauma Status: Date Acquired: 12/19/2018 Comorbid Asthma, Chronic Obstructive Pulmonary Weeks Of Treatment: 1 History: Disease (COPD), Sleep Apnea, Arrhythmia, Clustered Wound: No Hypertension, Type II Diabetes, Osteoarthritis, Neuropathy, Seizure Disorder Photos Wound Measurements Length: (cm) 2.5 % Reduc Width: (cm) 1.6 % Reduc Depth: (cm) 0.1 Epithel Area: (cm) 3.142 Tunnel Volume: (cm) 0.314 Underm Wound Description Full Thickness Without Exposed Support Foul O Classification:  Structures Slough Wound Flat and Intact Margin: Exudate Small Amount: Exudate Serosanguineous Type: Exudate red, brown Color: Wound Bed Granulation Amount: Large (67-100%) Granulation Quality: Red Fascia Necrotic Amount: None Present (0%) Fat Lay Tendon Muscle Joint E Bone Ex dor After Cleansing: No /Fibrino No Exposed Structure Exposed: No er (Subcutaneous Tissue) Exposed: Yes Exposed: No Exposed: No xposed: No posed: No tion in Area: -212.6% tion in Volume: -210.9% ialization: None ing: No ining: No Electronic Signature(s) Signed: 02/08/2019 4:01:42 PM By: Mikeal Hawthorne EMT/HBOT Signed: 03/14/2019 3:01:15 PM By: Elizabeth Coria RN Entered By: Mikeal Hawthorne on 01/17/2019 09:54:12 -------------------------------------------------------------------------------- Vitals Details Patient Name: Date of Service: [name] 01/16/2019 10:15 AM Medical Record [id number] Patient Account Number: [id number] Date of Birth/Sex: Treating RN: 13-Jun-1950 (68 y.o. [name] Primary Care Eudell Julian: [name] Other Clinician: Referring Tirrell Buchberger: Treating Geoffrey Hynes/Extender:[name], [name] in Treatment: 1 Vital Signs Time Taken: 10:40 Temperature (F): 98.4 Height (in): 64 Pulse (bpm): 80 Weight (lbs): 130 Respiratory Rate (breaths/min): 18 Body Mass Index (BMI): 22.3 Blood Pressure (mmHg): 172/90 Reference Range: 80 - 120 mg / dl Electronic Signature(s) Signed: 03/14/2019 3:01:15 PM By: Elizabeth Coria RN Entered By: [name] on 01/16/2019 10:40:58

## 2019-03-14 NOTE — Progress Notes (Signed)
[name], [name] ([id number]) Visit Report for 01/30/2019 Arrival Information Details Patient Name: Date of Service: [name], [name] 01/30/2019 10:15 AM Medical Record [id number] Patient Account Number: [id number] Date of Birth/Sex: Treating RN: March 17, 1951 (68 y.o. Orvan Falconer Primary Care Jahki Witham: Abran Richard Other Clinician: Referring Kishawn Pickar: Treating Coulton Schlink/Extender:Robson, Kandice Hams, Karolee Ohs in Treatment: 3 Visit Information History Since Last Visit All ordered tests and consults were completed: No Patient Arrived: Wheel Chair Added or deleted any medications: No Arrival Time: 10:23 Any new allergies or adverse reactions: No Accompanied By: self Had a fall or experienced change in No activities of daily living that may affect Transfer Assistance: None risk of falls: Patient Identification Verified: Yes Signs or symptoms of abuse/neglect since last No Secondary Verification Process Completed: Yes visito Patient Requires Transmission-Based No Hospitalized since last visit: No Precautions: Implantable device outside of the clinic excluding No Patient Has Alerts: Yes cellular tissue based products placed in the center Patient Alerts: R ABI = since last visit: 1.03 Has Dressing in Place as Prescribed: Yes Has Compression in Place as Prescribed: Yes Pain Present Now: No Electronic Signature(s) Signed: 03/14/2019 3:01:15 PM By: Carlene Coria RN Entered By: Carlene Coria on 01/30/2019 10:23:47 -------------------------------------------------------------------------------- Clinic Level of Care Assessment Details Patient Name: Date of Service: [name], [name] 01/30/2019 10:15 AM Medical Record [id number] Patient Account Number: [id number] Date of Birth/Sex: Treating RN: 1951/02/11 (68 y.o. [name] Primary Care Olia Hinderliter: Abran Richard Other Clinician: Referring Concettina Leth: Treating Felipa Laroche/Extender:Robson, Kandice Hams,  Karolee Ohs in Treatment: 3 Clinic Level of Care Assessment Items TOOL 4 Quantity Score X - Use when only an EandM is performed on FOLLOW-UP visit 1 0 ASSESSMENTS - Nursing Assessment / Reassessment X - Reassessment of Co-morbidities (includes updates in patient status) 1 10 X - Reassessment of Adherence to Treatment Plan 1 5 ASSESSMENTS - Wound and Skin Assessment / Reassessment X - Simple Wound Assessment / Reassessment - one wound 1 5 []  - Complex Wound Assessment / Reassessment - multiple wounds 0 []  - Dermatologic / Skin Assessment (not related to wound area) 0 ASSESSMENTS - Focused Assessment []  - Circumferential Edema Measurements - multi extremities 0 []  - Nutritional Assessment / Counseling / Intervention 0 X - Lower Extremity Assessment (monofilament, tuning fork, pulses) 1 5 []  - Peripheral Arterial Disease Assessment (using hand held doppler) 0 ASSESSMENTS - Ostomy and/or Continence Assessment and Care []  - Incontinence Assessment and Management 0 []  - Ostomy Care Assessment and Management (repouching, etc.) 0 PROCESS - Coordination of Care X - Simple Patient / Family Education for ongoing care 1 15 []  - Complex (extensive) Patient / Family Education for ongoing care 0 X - Staff obtains Programmer, systems, Records, Test Results / Process Orders 1 10 X - Staff telephones HHA, Nursing Homes / Clarify orders / etc 1 10 []  - Routine Transfer to another Facility (non-emergent condition) 0 []  - Routine Hospital Admission (non-emergent condition) 0 []  - New Admissions / Biomedical engineer / Ordering NPWT, Apligraf, etc. 0 []  - Emergency Hospital Admission (emergent condition) 0 X - Simple Discharge Coordination 1 10 []  - Complex (extensive) Discharge Coordination 0 PROCESS - Special Needs []  - Pediatric / Minor Patient Management 0 []  - Isolation Patient Management 0 []  - Hearing / Language / Visual special needs 0 []  - Assessment of Community assistance (transportation, D/C  planning, etc.) 0 []  - Additional assistance / Altered mentation 0 []  - Support Surface(s) Assessment (bed, cushion, seat, etc.) 0 INTERVENTIONS - Wound Cleansing / Measurement  X - Simple Wound Cleansing - one wound 1 5 []  - Complex Wound Cleansing - multiple wounds 0 X - Wound Imaging (photographs - any number of wounds) 1 5 []  - Wound Tracing (instead of photographs) 0 X - Simple Wound Measurement - one wound 1 5 []  - Complex Wound Measurement - multiple wounds 0 INTERVENTIONS - Wound Dressings []  - Small Wound Dressing one or multiple wounds 0 []  - Medium Wound Dressing one or multiple wounds 0 []  - Large Wound Dressing one or multiple wounds 0 []  - Application of Medications - topical 0 []  - Application of Medications - injection 0 INTERVENTIONS - Miscellaneous []  - External ear exam 0 []  - Specimen Collection (cultures, biopsies, blood, body fluids, etc.) 0 []  - Specimen(s) / Culture(s) sent or taken to Lab for analysis 0 []  - Patient Transfer (multiple staff / Civil Service fast streamer / Similar devices) 0 []  - Simple Staple / Suture removal (25 or less) 0 []  - Complex Staple / Suture removal (26 or more) 0 []  - Hypo / Hyperglycemic Management (close monitor of Blood Glucose) 0 []  - Ankle / Brachial Index (ABI) - do not check if billed separately 0 X - Vital Signs 1 5 Has the patient been seen at the hospital within the last three years: Yes Total Score: 90 Level Of Care: New/Established - Level 3 Electronic Signature(s) Signed: 01/30/2019 6:04:10 PM By: Levan Hurst RN, BSN Entered By: Levan Hurst on 01/30/2019 12:12:17 -------------------------------------------------------------------------------- Lower Extremity Assessment Details Patient Name: Date of Service: [name], [name] 01/30/2019 10:15 AM Medical Record [id number] Patient Account Number: [id number] Date of Birth/Sex: Treating RN: 1950-10-12 (68 y.o. [name] Primary Care Jennie Hannay: Abran Richard Other  Clinician: Referring Francie Keeling: Treating Tinie Mcgloin/Extender:Robson, Kandice Hams, Karolee Ohs in Treatment: 3 Edema Assessment Assessed: [Left: No] [Right: No] Edema: [Left: Ye] [Right: s] Calf Left: Right: Point of Measurement: 31 cm From Medial Instep cm 31.5 cm Ankle Left: Right: Point of Measurement: 9 cm From Medial Instep cm 20.5 cm Vascular Assessment Pulses: Dorsalis Pedis Palpable: [Right:Yes] Electronic Signature(s) Signed: 01/30/2019 6:04:10 PM By: Levan Hurst RN, BSN Entered By: Levan Hurst on 01/30/2019 11:00:50 -------------------------------------------------------------------------------- Multi Wound Chart Details Patient Name: Date of Service: [name] 01/30/2019 10:15 AM Medical Record [id number] Patient Account Number: [id number] Date of Birth/Sex: Treating RN: 05/08/1950 (68 y.o. [name] Primary Care Blessings Inglett: Abran Richard Other Clinician: Referring Selene Peltzer: Treating Stefany Starace/Extender:Robson, Kandice Hams, Karolee Ohs in Treatment: 3 Vital Signs Height(in): 64 Pulse(bpm): 75 Weight(lbs): 130 Blood Pressure(mmHg): 146/94 Body Mass Index(BMI): 22 Temperature(F): 98.4 Respiratory 18 Rate(breaths/min): Photos: [3:No Photos] [N/A:N/A] Wound Location: [3:Right Lower Leg - Anterior N/A] Wounding Event: [3:Trauma] [N/A:N/A] Primary Etiology: [3:Venous Leg Ulcer] [N/A:N/A] Comorbid History: [3:Asthma, Chronic Obstructive Pulmonary Disease (COPD), Sleep Apnea, Arrhythmia, Hypertension, Type II Diabetes, Osteoarthritis, Neuropathy, Seizure Disorder] [N/A:N/A] Date Acquired: [3:12/19/2018] [N/A:N/A] Weeks of Treatment: [3:3] [N/A:N/A] Wound Status: [3:Healed - Epithelialized] [N/A:N/A] Measurements L x W x D 0x0x0 [N/A:N/A] (cm) Area (cm) : [3:0] [N/A:N/A] Volume (cm) : [3:0] [N/A:N/A] % Reduction in Area: [3:100.00%] [N/A:N/A] % Reduction in Volume: 100.00% [N/A:N/A] Classification: [3:Full Thickness Without  Exposed Support Structures] [N/A:N/A] Exudate Amount: [3:None Present] [N/A:N/A] Wound Margin: [3:Flat and Intact] [N/A:N/A] Granulation Amount: [3:None Present (0%)] [N/A:N/A] Necrotic Amount: [3:None Present (0%)] [N/A:N/A] Exposed Structures: [3:Fascia: No Fat Layer (Subcutaneous Tissue) Exposed: No Tendon: No Muscle: No Joint: No Bone: No Large (67-100%)] [N/A:N/A N/A] Treatment Notes Electronic Signature(s) Signed: 01/30/2019 5:48:14 PM By: Linton Ham MD Signed: 01/30/2019 6:04:10 PM By:  Levan Hurst RN, BSN Entered By: Linton Ham on 01/30/2019 13:16:26 -------------------------------------------------------------------------------- Multi-Disciplinary Care Plan Details Patient Name: Date of Service: [name], [name] 01/30/2019 10:15 AM Medical Record [id number] Patient Account Number: [id number] Date of Birth/Sex: Treating RN: 06/11/1950 (68 y.o. [name] Primary Care Keiton Cosma: Abran Richard Other Clinician: Referring Marwan Lipe: Treating Oasis Goehring/Extender:Robson, Kandice Hams, Karolee Ohs in Treatment: 3 Active Inactive Electronic Signature(s) Signed: 01/30/2019 6:04:10 PM By: Levan Hurst RN, BSN Entered By: Levan Hurst on 01/30/2019 12:02:25 -------------------------------------------------------------------------------- Pain Assessment Details Patient Name: Date of Service: [name], [name] 01/30/2019 10:15 AM Medical Record [id number] Patient Account Number: [id number] Date of Birth/Sex: Treating RN: [birth year] (68 y.o. Orvan Falconer Primary Care Brittin Belnap: Abran Richard Other Clinician: Referring Yair Dusza: Treating Kinleigh Nault/Extender:Robson, Kandice Hams, Karolee Ohs in Treatment: 3 Active Problems Location of Pain Severity and Description of Pain Patient Has Paino No Site Locations Pain Management and Medication Current Pain Management: Electronic Signature(s) Signed: 03/14/2019 3:01:15 PM By: Carlene Coria  RN Entered By: Carlene Coria on 01/30/2019 10:26:40 -------------------------------------------------------------------------------- Patient/Caregiver Education Details Patient Name: Date of Service: [name] 10/26/2020andnbsp10:15 AM Medical Record Patient Account Number: [id number] Number: Treating RN: Levan Hurst Date of Birth/Gender: Aug 10, 1950 (68 y.o. F) Other Clinician: Primary Care Physician: Roxanne Mins Referring Physician: Physician/Extender: Cecelia Byars in Treatment: 3 Education Assessment Education Provided To: Patient Education Topics Provided Wound/Skin Impairment: Methods: Explain/Verbal Responses: State content correctly Electronic Signature(s) Signed: 01/30/2019 6:04:10 PM By: Levan Hurst RN, BSN Entered By: Levan Hurst on 01/30/2019 12:02:34 -------------------------------------------------------------------------------- Wound Assessment Details Patient Name: Date of Service: [name], [name] 01/30/2019 10:15 AM Medical Record [id number] Patient Account Number: [id number] Date of Birth/Sex: Treating RN: 27-Apr-1950 (68 y.o. Orvan Falconer Primary Care Kamara Allan: Abran Richard Other Clinician: Referring Bharat Antillon: Treating Arlisha Patalano/Extender:Robson, Kandice Hams, Karolee Ohs in Treatment: 3 Wound Status Wound Number: 3 Primary Venous Leg Ulcer Etiology: Wound Location: Right Lower Leg - Anterior Wound Healed - Epithelialized Wounding Event: Trauma Status: Date Acquired: 12/19/2018 Comorbid Asthma, Chronic Obstructive Pulmonary Weeks Of Treatment: 3 History: Disease (COPD), Sleep Apnea, Arrhythmia, Clustered Wound: No Hypertension, Type II Diabetes, Osteoarthritis, Neuropathy, Seizure Disorder Wound Measurements Length: (cm) 0 % Reducti Width: (cm) 0 % Reducti Depth: (cm) 0 Epithelia Area: (cm) 0 Tunnelin Volume: (cm) 0 Undermin Wound Description Full Thickness Without  Exposed Support Classification: Structures Wound Flat and Intact Margin: Exudate None Present Amount: Wound Bed Granulation Amount: None Present (0%) Necrotic Amount: None Present (0%) Foul Odor After Cleansing: No Slough/Fibrino No Exposed Structure Fascia Exposed: No Fat Layer (Subcutaneous Tissue) Exposed: No Tendon Exposed: No Muscle Exposed: No Joint Exposed: No Bone Exposed: No on in Area: 100% on in Volume: 100% lization: Large (67-100%) g: No ing: No Electronic Signature(s) Signed: 01/30/2019 6:04:10 PM By: Levan Hurst RN, BSN Signed: 03/14/2019 3:01:15 PM By: Carlene Coria RN Entered By: Levan Hurst on 01/30/2019 11:02:50 -------------------------------------------------------------------------------- Vitals Details Patient Name: Date of Service: [name] 01/30/2019 10:15 AM Medical Record [id number] Patient Account Number: [id number] Date of Birth/Sex: Treating RN: 08-12-1950 (68 y.o. Orvan Falconer Primary Care Derel Mcglasson: Abran Richard Other Clinician: Referring Natayla Cadenhead: Treating Callia Swim/Extender:Robson, Kandice Hams, Karolee Ohs in Treatment: 3 Vital Signs Time Taken: 10:24 Temperature (F): 98.4 Height (in): 64 Pulse (bpm): 75 Weight (lbs): 130 Respiratory Rate (breaths/min): 18 Body Mass Index (BMI): 22.3 Blood Pressure (mmHg): 146/94 Reference Range: 80 - 120 mg / dl Electronic Signature(s) Signed: 03/14/2019 3:01:15 PM By: Carlene Coria RN Entered By: Carlene Coria on 01/30/2019 10:26:34

## 2019-03-16 ENCOUNTER — Ambulatory Visit (HOSPITAL_COMMUNITY): Payer: Medicare Other | Attending: Nurse Practitioner | Admitting: Speech Pathology

## 2019-03-16 ENCOUNTER — Encounter (HOSPITAL_COMMUNITY): Payer: Self-pay | Admitting: Speech Pathology

## 2019-03-16 ENCOUNTER — Other Ambulatory Visit: Payer: Self-pay

## 2019-03-16 ENCOUNTER — Ambulatory Visit (HOSPITAL_COMMUNITY)
Admission: RE | Admit: 2019-03-16 | Discharge: 2019-03-16 | Disposition: A | Discharge status: Home / Self Care | Payer: Medicare Other | Source: Ambulatory Visit | Attending: Nurse Practitioner | Admitting: Nurse Practitioner

## 2019-03-16 DIAGNOSIS — R1313 Dysphagia, pharyngeal phase: Secondary | ICD-10-CM | POA: Insufficient documentation

## 2019-03-16 DIAGNOSIS — R1312 Dysphagia, oropharyngeal phase: Secondary | ICD-10-CM | POA: Insufficient documentation

## 2019-03-16 DIAGNOSIS — J690 Pneumonitis due to inhalation of food and vomit: Secondary | ICD-10-CM | POA: Diagnosis present

## 2019-03-16 NOTE — Therapy (Signed)
[place], [place], [address] Phone: [phone]   Fax:  [phone]  Modified Barium Swallow  Patient Details  Name: [name] MRN: [id number] Date of Birth: December 21, 1950 No data recorded  Encounter Date: 03/16/2019  End of Session - 03/16/19 1522    Visit Number  1    Number of Visits  1    Authorization Type  Medicare    SLP Start Time  1150    SLP Stop Time   1232    SLP Time Calculation (min)  42 min    Activity Tolerance  Patient tolerated treatment well       Past Medical History:  Diagnosis Date  . Allergic rhinitis    uses Nasonex daily as needed  . Allergy to angiotensin receptor blockers (ARB) 01/22/2014   Angioedema  . Anxiety    takes Ativan daily as needed  . Aspiration pneumonia ([place])   . Asthma    Albuterol inhaler and Neb daily as needed  . 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 [name], residual palpitations  . Bipolar 1 disorder ([place])   . Chronic back pain    reason unknown  . Constipation   . COPD (chronic obstructive pulmonary disease) ([place])   . DDD (degenerative disc disease), lumbar   . Depression    takes Zoloft daily as well as Cymbalta  . Diabetes mellitus    Type II  . Dysphagia   . Dysrhythmia    SVT ==ablation done by Dr. Rayann Heman 2007  . Esophageal reflux    takes Zantac daily  . Frequent headaches   . History of bronchitis Dec 2014  . Hypertension    takes Losartan daily  . Hypothyroidism    takes Synthroid daily  . Joint pain   . Joint swelling    left ankle  . Low sodium syndrome   . Lumbar radiculopathy   . Seizures ([place]) 04-05-13   one time ran out of Primidone and had stopped it cold [place]  . SVT (supraventricular tachycardia) ([place])   . Tremor, essential    takes Mysoline and Neurontin daily.  . Vocal cord dysfunction   . Weakness    in left arm    Past Surgical History:  Procedure  Laterality Date  . ABDOMINAL HYSTERECTOMY    . ABLATION FOR AVNRT    . APPENDECTOMY    . BARTHOLIN GLAND CYST EXCISION     x 2  . BIOPSY  03/04/2015   Procedure: BIOPSY (Duodenal, Gastric);  Surgeon: Daneil Dolin, MD;  Location: [facility];  Service: Endoscopy;;  . BIOPSY  12/19/2018   Procedure: BIOPSY;  Surgeon: Daneil Dolin, MD;  [facility]: [facility];  Service: Endoscopy;;  Gastric Biopsies    . BREAST SURGERY    . CESAREAN SECTION    . CHOLECYSTECTOMY    . COLONOSCOPY  01/16/2004   [id number] rectum/colon  . COLONOSCOPY WITH PROPOFOL N/A 03/04/2015   Dr.[name]- internal hemorrhoids, pancolonic diverticulosis, colonic polyp= tubular adenoma  . DEEP BRAIN STIMULATOR PLACEMENT    . DENTAL SURGERY    . ESOPHAGEAL DILATION N/A 03/04/2015   Procedure: ESOPHAGEAL DILATION WITH 54FR MALONEY DILATOR;  Surgeon: Daneil Dolin, MD;  Location: [facility];  Service: Endoscopy;  Laterality: N/A;  . ESOPHAGEAL DILATION  12/19/2018   Procedure: ESOPHAGEAL DILATION;  Surgeon: Daneil Dolin, MD;  [facility];  Service:  Endoscopy;;  . ESOPHAGOGASTRODUODENOSCOPY (EGD) WITH ESOPHAGEAL DILATION  04/03/2002   [id number] ring, otherwise normal esophagus, status post dilation with 56 F/Normal stomach  . ESOPHAGOGASTRODUODENOSCOPY (EGD) WITH ESOPHAGEAL DILATION N/A 11/08/2012   Dr. [name]:schatzki's ring s/p dilation/hiatal hernia  . ESOPHAGOGASTRODUODENOSCOPY (EGD) WITH PROPOFOL N/A 03/04/2015   Dr.[name]- schatzki's ring, hiatal hernia, scattered erosions bx= chronic inactive gastritis  . ESOPHAGOGASTRODUODENOSCOPY (EGD) WITH PROPOFOL N/A 05/06/2017   Normal esophagus. Dilated. Small hiatal hernia. Normal duodenal bulb and second portion of duodenum.  . ESOPHAGOGASTRODUODENOSCOPY (EGD) WITH PROPOFOL N/A 12/19/2018   Procedure: ESOPHAGOGASTRODUODENOSCOPY (EGD) WITH PROPOFOL;  Surgeon: Daneil Dolin, MD;  [facility];  Service: Endoscopy;  Laterality: N/A;  11:15am  . FINGER  SURGERY    . HEMORRHOID BANDING  2017   Dr.[name]  . LEFT ANKLE LIGAMENT REPAIR     x 2  . LEFT ELBOW REPAIR    . MALONEY DILATION N/A 05/06/2017   Procedure: Venia Minks DILATION;  Surgeon: Daneil Dolin, MD;  [facility];  Service: Endoscopy;  Laterality: N/A;  . Venia Minks DILATION N/A 12/19/2018   Procedure: Venia Minks DILATION;  Surgeon: Daneil Dolin, MD;  [facility];  Service: Endoscopy;  Laterality: N/A;  . MAXIMUM ACCESS (MAS)POSTERIOR LUMBAR INTERBODY FUSION (PLIF) 1 LEVEL N/A 04/24/2014   Procedure: Lumbar four-five Maximum access Surgery posterior lumbar interbody fusion;  Surgeon: [name], MD;  Location: [facility];  Service: Neurosurgery;  Laterality: N/A;  Lumbar four-five Maximum access Surgery posterior lumbar interbody fusion  . MULTIPLE EXTRACTIONS WITH ALVEOLOPLASTY N/A 06/18/2017   Procedure: MULTIPLE EXTRACTION;  Surgeon: Diona Browner, DDS;  Location: [facility];  Service: Oral Surgery;  Laterality: N/A;  . PARTIAL HYSTERECTOMY    . POLYPECTOMY  03/04/2015   Procedure: POLYPECTOMY (descending colon);  Surgeon: Daneil Dolin, MD;  [facility]: [facility];  Service: Endoscopy;;  . PULSE GENERATOR IMPLANT Bilateral 12/15/2013   Procedure: BILATERAL PULSE GENERATOR IMPLANT;  Surgeon: [name], MD;  Location: [facility];  Service: Neurosurgery;  Laterality: Bilateral;  BILATERAL  . RIGHT BREAST CYST     benign  . SUBTHALAMIC STIMULATOR BATTERY REPLACEMENT Bilateral 06/24/2018   Procedure: Bilateral implantable pulse generator battery change;  Surgeon: [name], MD;  Location: [facility];  Service: Neurosurgery;  Laterality: Bilateral;  bilateral  . SUBTHALAMIC STIMULATOR INSERTION Bilateral 12/04/2013   Procedure: Bilateral Deep brain stimulator placement;  Surgeon: [name], MD;  Location: [facility];  Service: Neurosurgery;  Laterality: Bilateral;  Bilateral Deep brain stimulator placement  . TONSILLECTOMY      There were no vitals filed for this  visit.  Subjective Assessment - 03/16/19 1433    Subjective  "I have more trouble with liquids."    Special Tests  MBSS    Currently in Pain?  No/denies          General - 03/16/19 1433      General Information   Date of Onset  03/08/19    HPI  [name] is a 68 y.o. female who was referred by [facility] for MBSS due reports of dysphagia. The Pt tells me (SLP) that she thinks she aspirates on liquids.  History of chronic GERD, drug-induced constipation, history of esophageal stenosis.  Symptoms worse if she is rushed to eat.  Abdominal pain for 2 weeks started with posttussive cough.  "Bad reflux" about 4 times a week and not getting Protonix daily before breakfast.  EGD updated 12/19/2018 and at that time  noted reflux well controlled on PPI, dysphagia improved but not resolved with resumption of acid suppression.  Surgical pathology of gastric biopsies found gastritis without histopathological changes. She sees Dr. [name] for essential tremor and had DBS surgery to the bilateral VIM done 12/04/13 with bilateral generator placement on 12/15/13. The Pt lives at [facility]. Pt with pseudobulbar quality to her speech (dysarthria).    Type of Study  MBS-Modified Barium Swallow Study    Previous Swallow Assessment  None on record    Diet Prior to this Study  Regular;Thin liquids    Temperature Spikes Noted  No    Respiratory Status  Room air    History of Recent Intubation  No    Behavior/Cognition  Alert;Cooperative    Oral Cavity Assessment  Within Functional Limits    Oral Care Completed by SLP  No    Oral Cavity - Dentition  Poor condition;Missing dentition    Vision  Functional for self feeding    Self-Feeding Abilities  Able to feed self    Patient Positioning  Upright in chair    Baseline Vocal Quality  Normal    Volitional Cough  Strong    Volitional Swallow  Able to elicit    Anatomy  Within functional limits    Pharyngeal Secretions  Not observed secondary MBS          Oral Preparation/Oral Phase - 03/16/19 1512      Oral Preparation/Oral Phase   Oral Phase  Impaired      Oral - Nectar   Oral - Nectar Cup  Within functional limits      Oral - Thin   Oral - Thin Teaspoon  Within functional limits    Oral - Thin Cup  Decreased bolus cohesion;Piecemeal swallowing    Oral - Thin Straw  Within functional limits      Oral - Solids   Oral - Puree  Within functional limits    Oral - Regular  Within functional limits;Delayed A-P transit    Oral - Pill  Within functional limits      Electrical stimulation - Oral Phase   Was Electrical Stimulation Used  No       Pharyngeal Phase - 03/16/19 1515      Pharyngeal Phase   Pharyngeal Phase  Impaired      Pharyngeal - Nectar   Pharyngeal- Nectar Cup  Swallow initiation at vallecula;Swallow initiation at pyriform sinus;Pharyngeal residue - valleculae;Penetration/Aspiration during swallow    Pharyngeal  Material does not enter airway;Material enters airway, remains ABOVE vocal cords then ejected out      Pharyngeal - Thin   Pharyngeal- Thin Teaspoon  Swallow initiation at pyriform sinus;Penetration/Aspiration before swallow;Pharyngeal residue - valleculae    Pharyngeal  Material does not enter airway;Material enters airway, remains ABOVE vocal cords then ejected out    Pharyngeal- Thin Cup  Swallow initiation at pyriform sinus;Penetration/Aspiration during swallow;Pharyngeal residue - valleculae    Pharyngeal  Material does not enter airway;Material enters airway, remains ABOVE vocal cords then ejected out    Pharyngeal- Thin Straw  Swallow initiation at pyriform sinus;Reduced airway/laryngeal closure;Penetration/Apiration after swallow;Trace aspiration;Pharyngeal residue - valleculae    Pharyngeal  Material does not enter airway;Material enters airway, remains ABOVE vocal cords then ejected out;Material enters airway, passes BELOW cords then ejected out   cued Pt for cough after no response, but  wet vocal quality     Pharyngeal - Solids   Pharyngeal- Puree  Within functional limits  Pharyngeal- Regular  Within functional limits       Cricopharyngeal Phase - 03/16/19 1520      Cervical Esophageal Phase   Cervical Esophageal Phase  Impaired      Cervical Esophageal Phase - Thin   Thin Cup  Other (Comment);Prominent cricopharyngeal segment      Cervical Esophageal Phase - Comment   Cervical Esophageal Comment  Small Zenker's diverticulum identified and confirmed by radiologist               Plan - 03/16/19 1522    Clinical Impression Statement  Pt presents with mild oropharyngeal dysphagia characterized by mildly decreased lingual control with reduced bolus cohesiveness and premature spillage into pharynx resulting in delay in swallow initiation and variable flash penetration (underepiglottic coating) of thins and a single episode of trace, silent aspiration of thins after the swallow when taking sequential straw sips of thin liquid. This was not immediately sensed by the Pt, however she did present with mild wet vocal quality and when cued to clear her throat, she effectively removed aspirate. Pt with mild vallecular pooling after the swallowing with liquids, which cleared with spontaneoud secondary swallow. Recommend regular textures and thin liquids, no straws, with standard aspiration and reflux precautions (Pt should swallow 2x for each bite/sip and clear her throat periodically after thin liquids). Attention should also be given to good oral care regimen. Pt can follow up with GI regarding small Zenker's diverticulum noted near CP segment (this did not appear clinically significant at this time).    Potential to Achieve Goals  Good    Consulted and Agree with Plan of Care  Patient       Patient will benefit from skilled therapeutic intervention in order to improve the following deficits and impairments:   Dysphagia, oropharyngeal  phase    Recommendations/Treatment - 03/16/19 1521      Swallow Evaluation Recommendations   SLP Diet Recommendations  Thin;Age appropriate regular    Liquid Administration via  No straw;Cup    Medication Administration  Whole meds with liquid    Supervision  Patient able to self feed    Compensations  Multiple dry swallows after each bite/sip;Clear throat intermittently    Postural Changes  Seated upright at 90 degrees;Remain upright for at least 30 minutes after feeds/meals       Prognosis - 03/16/19 1521      Prognosis   Prognosis for Safe Diet Advancement  Good      Individuals Consulted   Consulted and Agree with Results and Recommendations  Patient    Report Sent to   Referring physician       Problem List Patient Active Problem List   Diagnosis Date Noted  . Uncomplicated asthma [id number]  . Skin erythema 09/28/2018  . Umbilical pain [id number]  . Constipation 03/18/2017  . CAP (community acquired pneumonia) 04/21/2016  . History of colonic polyps   . Diverticulosis of colon without hemorrhage   . Other hemorrhoids   . [name]'s ring   . Mucosal abnormality of stomach   . Dysphagia   . Encounter for screening colonoscopy 02/21/2015  . Upper airway cough syndrome 01/18/2015  . OAB (overactive bladder) 12/30/2014  . Incontinence in female 12/25/2014  . MDD (major depressive disorder), recurrent severe, without psychosis ([place]) 12/14/2014  . Thrush of mouth and esophagus ([place]) 11/23/2014  . Depression with somatization 11/05/2014  . Type II diabetes mellitus with manifestations ([place]) 10/23/2014  . Visit for screening mammogram 08/21/2014  .  Hypokalemia 05/30/2014  . Tinea corporis 05/30/2014  . Lumbar scoliosis 04/24/2014  . Obstructive sleep apnea 04/23/2014  . Moderate persistent asthma with acute bronchitis and acute exacerbation 03/02/2014  . Allergy to angiotensin receptor blockers (ARB) 01/22/2014  . S/P deep brain stimulator placement 01/10/2014  .  Greater trochanteric bursitis of left hip 11/07/2013  . Spinal stenosis of lumbar region 10/30/2013  . Right thyroid nodule 10/12/2013  . CMC arthritis, thumb, degenerative 06/15/2013  . GERD (gastroesophageal reflux disease) 11/03/2012  . IBS (irritable bowel syndrome) 11/03/2012  . Esophageal stenosis 11/01/2012  . Asthma, moderate persistent, poorly-controlled 09/01/2012  . Hypothyroidism 12/22/2011  . Hyperlipidemia with target LDL less than 100 06/22/2008  . Essential hypertension 09/04/2007  . TREMOR, ESSENTIAL 04/19/2007  . Seasonal and perennial allergic rhinitis 04/19/2007   Thank you,  Genene Churn, [place]  [facility] 03/16/2019, 3:33 PM  [place] [address] [place], [place], [address] Phone: [phone]   Fax:  [phone]  Name: [name] MRN: [id number] Date of Birth: 03/24/1951

## 2019-03-29 NOTE — Progress Notes (Signed)
Thank you will forward to Dr. Annamaria Boots.   Aaron Edelman

## 2019-04-21 NOTE — Progress Notes (Deleted)
[name] was seen today in follow up for essential tremor.  My previous records were reviewed prior to todays visit.  Outside medical records have also been reviewed.  She has been following with pulmonary.  She has also been following gastroenterology for dysphagia which is felt likely due to reflux.    Current prescribed movement disorder medications: ***   PREVIOUS MEDICATIONS: {Parkinson's RX:18200}  ALLERGIES:   Allergies  Allergen Reactions  . Invokana [Canagliflozin] Other (See Comments)    Yeast infectios  . Losartan Other (See Comments)    Angioedema   . Metformin And Related Diarrhea  . Latex Other (See Comments)    Other Reaction: redness, blisters  . Other Other (See Comments)    Air fresheners - on MAR  . Oxycodone-Acetaminophen Itching and Other (See Comments)    TYLOX. Caused internal itching per patient   . Mucinex [Guaifenesin Er] Other (See Comments)    MUCINEX REACTION: asthma attacks    CURRENT MEDICATIONS:  Outpatient Encounter Medications as of 04/24/2019  Medication Sig  . acetaminophen (TYLENOL) 500 MG tablet Take 500 mg by mouth every 4 (four) hours as needed for mild pain, fever or headache.   . albuterol (PROAIR HFA) 108 (90 Base) MCG/ACT inhaler Inhale 2 puffs every 6 hours for asthma - rescue inhaler (Patient taking differently: Inhale 2 puffs into the lungs every 6 (six) hours as needed for shortness of breath. )  . albuterol (PROVENTIL) (2.5 MG/3ML) 0.083% nebulizer solution Take 2.5 mg by nebulization every 6 (six) hours as needed for wheezing or shortness of breath.  Marland Kitchen alum & mag hydroxide-simeth (MAALOX/MYLANTA) 200-200-20 MG/5ML suspension Take 30 mLs by mouth as needed for indigestion or heartburn. Max 4 doses per 24 hours  . amoxicillin-clavulanate (AUGMENTIN) 875-125 MG tablet Take 1 tablet by mouth 2 (two) times daily.  Marland Kitchen aspirin 81 MG chewable tablet Chew 81 mg by mouth daily.   Marland Kitchen azithromycin (ZITHROMAX) 250 MG tablet Take  2 tablets today, then 1 tablet daily until gone.  . baclofen (LIORESAL) 10 MG tablet Take 10 mg by mouth at bedtime.  . Carboxymethylcellulose Sod PF (REFRESH PLUS) 0.5 % SOLN Place 1 drop into both eyes 2 (two) times daily.   . cephALEXin (KEFLEX) 500 MG capsule   . clotrimazole-betamethasone (LOTRISONE) cream Apply 1 application topically 2 (two) times daily.  Marland Kitchen dextromethorphan (DELSYM) 30 MG/5ML liquid 10 ml scheduled every 12 hours. Extra dose every 4 hours if needed for cough  . Dextromethorphan-guaiFENesin 5-100 MG/5ML LIQD 10 ml by mouth every 12 hours, and may take extra dose every 6 hours if needed  . divalproex (DEPAKOTE) 500 MG DR tablet Take 500 mg by mouth 3 (three) times daily.   Marland Kitchen docusate sodium (COLACE) 100 MG capsule Take 1 capsule (100 mg total) by mouth daily.  Marland Kitchen ENULOSE 10 GM/15ML SOLN Take 10 g by mouth daily as needed.   . fluticasone furoate-vilanterol (BREO ELLIPTA) 100-25 MCG/INH AEPB Inhale 1 puff into the lungs daily. (Patient taking differently: Inhale 1 puff into the lungs daily. RINSE MOUTH WITH WATER AFTER USE)  . gabapentin (NEURONTIN) 300 MG capsule Take 300-600 mg by mouth See admin instructions. Take 300 mg by mouth in the morning and 600 mg at bedtime  . glucose 4 GM chewable tablet Chew 5 tablets by mouth as needed for low blood sugar.  . HYDROcodone-acetaminophen (NORCO/VICODIN) 5-325 MG tablet Take 1 tablet by mouth 3 (three) times daily.  . hydrocortisone (PROCTOCORT) 1 %  CREA Apply 1 application topically 2 (two) times daily as needed (hemorrhoid flare).  Marland Kitchen ibuprofen (ADVIL,MOTRIN) 200 MG tablet Take 200 mg by mouth every 6 (six) hours as needed for fever, headache or moderate pain.   Marland Kitchen insulin glargine (LANTUS) 100 UNIT/ML injection Inject 15 Units into the skin at bedtime. *Hold if blood glucose is less than 160*  . ipratropium (ATROVENT) 0.03 % nasal spray 1-2 puffs each nostril 3 times daily, before meals (Patient taking differently: Place 1 spray into  both nostrils 3 (three) times daily before meals. )  . levothyroxine (SYNTHROID, LEVOTHROID) 75 MCG tablet TAKE (1) TABLET BY MOUTH ONCE DAILY BEFORE BREAKFAST. (Patient taking differently: Take 75 mcg by mouth daily before breakfast. )  . loperamide (IMODIUM A-D) 2 MG tablet Take 2 mg by mouth 4 (four) times daily as needed for diarrhea or loose stools.   . magnesium hydroxide (MILK OF MAGNESIA) 400 MG/5ML suspension Take 30 mLs by mouth at bedtime as needed for mild constipation.   . Menthol, Topical Analgesic, (BIOFREEZE) 4 % GEL Apply 1 application topically 2 (two) times daily. Apply to lower back and base of right thumb twice daily.  . metoprolol succinate (TOPROL-XL) 50 MG 24 hr tablet Take 50 mg by mouth daily. Take with or immediately following a meal.  . Multiple Vitamin (MULTIVITAMIN) capsule Take 1 capsule by mouth daily.  . Multiple Vitamins-Minerals (MULTIVITAMINS THER. W/MINERALS) TABS tablet TAKE ONE TABLET BY MOUTH ONCE DAILY. (Patient taking differently: Take 1 tablet by mouth daily. )  . neomycin-bacitracin-polymyxin (NEOSPORIN) ointment Apply 1 application topically as needed for wound care.  Marland Kitchen NOVOLOG FLEXPEN 100 UNIT/ML FlexPen Inject 8 Units into the skin 3 (three) times daily before meals.   . Omega 3 1000 MG CAPS TAKE (1) CAPSULE BY MOUTH ONCE DAILY. (Patient taking differently: Take 1,000 mg by mouth daily. )  . pantoprazole (PROTONIX) 40 MG tablet Take 40 mg by mouth daily.  Marland Kitchen PARoxetine (PAXIL) 10 MG tablet Take 10 mg by mouth daily.   Vladimir Faster Glycol-Propyl Glycol (SYSTANE ULTRA) 0.4-0.3 % SOLN Place 1 drop into both eyes 4 (four) times daily as needed (dry eyes).  . polyethylene glycol (MIRALAX / GLYCOLAX) packet Take 17 g by mouth daily as needed for moderate constipation.  . potassium chloride SA (K-DUR,KLOR-CON) 20 MEQ tablet Take 20 mEq by mouth daily.  . primidone (MYSOLINE) 50 MG tablet Take 50 mg by mouth daily.   . QUEtiapine (SEROQUEL) 100 MG tablet Take 100  mg by mouth at bedtime.  Marland Kitchen Respiratory Therapy Supplies (FLUTTER) DEVI Use flutter device 3 times a day  . simvastatin (ZOCOR) 5 MG tablet Take 5 mg by mouth at bedtime.   . sitaGLIPtin (JANUVIA) 100 MG tablet Take 100 mg by mouth daily.  . sodium chloride (OCEAN) 0.65 % SOLN nasal spray Take 1-2 sprays in nostril every hour as needed for rhinitis (Patient taking differently: Place 1 spray into both nostrils every hour as needed for congestion. )  . sodium chloride 1 g tablet Take 1 g by mouth 2 (two) times daily.  . sucralfate (CARAFATE) 1 g tablet Take 1 g by mouth 2 (two) times daily. ON AN EMPTY STOMACH   No facility-administered encounter medications on file as of 04/24/2019.    PHYSICAL EXAMINATION:    VITALS:  There were no vitals filed for this visit.  GEN:  The patient appears stated age and is in NAD. HEENT:  Normocephalic, atraumatic.  The mucous membranes are moist.  The superficial temporal arteries are without ropiness or tenderness. CV:  RRR Lungs:  CTAB Neck/HEME:  There are no carotid bruits bilaterally.  Neurological examination:  Orientation: The patient is alert and oriented x3. Cranial nerves: There is good facial symmetry. The speech is fluent and clear. Soft palate rises symmetrically and there is no tongue deviation. Hearing is intact to conversational tone. Sensation: Sensation is intact to light touch throughout Motor: Strength is at least antigravity x4.  Movement examination: Tone: There is normal tone in the UE/LE Abnormal movements: *** Coordination:  There is *** decremation with RAM's, *** Gait and Station: The patient has *** difficulty arising out of a deep-seated chair without the use of the hands. The patient's stride length is good   ASSESSMENT/PLAN:  1.  Essential Tremor  ***Patient is status post bilateral VIM DBS on 12/04/2013 with generator last replaced in March, 2020.  Postop imaging indicates late leads or perhaps just slightly to lateral  and more inferior than anticipated.  -I have been worried in the past that device was contributing to mood change.  However, the device has been turned off for over a month and facility reported that mood was not any better, but was worse with the device off.  Tremor was also markedly worse.  -Continue on primidone, 50 mg daily. 2.  Chronic pain syndrome, both with diabetic peripheral neuropathy and L4 radiculopathy.  -Patient on chronic opioid pain management.  -Patient on gabapentin.  Total time spent on today's visit was ***greater than 30 minutes, including both face-to-face time and nonface-to-face time.  Time included that spent on review of records (prior notes available to me/labs/imaging if pertinent), discussing treatment and goals, answering patient's questions and coordinating care.  Cc:  Abran Richard, MD

## 2019-04-24 ENCOUNTER — Encounter: Payer: Medicare Other | Admitting: Neurology

## 2019-05-01 ENCOUNTER — Ambulatory Visit: Payer: Medicare Other | Admitting: Podiatry

## 2019-05-10 ENCOUNTER — Encounter: Payer: Self-pay | Admitting: Nurse Practitioner

## 2019-05-10 ENCOUNTER — Other Ambulatory Visit: Payer: Self-pay

## 2019-05-10 ENCOUNTER — Ambulatory Visit (INDEPENDENT_AMBULATORY_CARE_PROVIDER_SITE_OTHER): Payer: Medicare Other | Admitting: Nurse Practitioner

## 2019-05-10 VITALS — BP 155/87 | HR 80 | Temp 96.9°F | Ht 64.0 in | Wt 191.2 lb

## 2019-05-10 DIAGNOSIS — R1310 Dysphagia, unspecified: Secondary | ICD-10-CM

## 2019-05-10 DIAGNOSIS — K5903 Drug induced constipation: Secondary | ICD-10-CM

## 2019-05-10 DIAGNOSIS — K219 Gastro-esophageal reflux disease without esophagitis: Secondary | ICD-10-CM

## 2019-05-10 NOTE — Assessment & Plan Note (Signed)
History of esophageal stenosis and dysphagia status post recent EGD and speech-language pathology evaluation and treatment.  See HPI for details on SLP notes.  Patient recommended to clear her throat after every swallow.  She is not using straws, as recommended by SLP.  She states she often forgets to clear her throat.  Occasionally will have a cough while drinking liquids.  I reinforced for her to please clear her throat with every swallow of food and liquids.  I will put this on her instructions and asked the facility to help reinforce this during mealtimes as well.  Follow-up in 6 months, call us for any worsening or severe symptoms.

## 2019-05-10 NOTE — Progress Notes (Signed)
Cc'ed to pcp °

## 2019-05-10 NOTE — Patient Instructions (Signed)
Your health issues we discussed today were:   Constipation: 1. I am giving you samples of Amitiza 24 mcg.  Take this twice a day, on a full stomach (after a meal). 2. Continue to use MiraLAX once a day 3. If you develop diarrhea you can hold the MiraLAX 4. Call us in 1 to 2 weeks and let us know if this helps you have better bowel movements 5. Continue to drink adequate water to help have softer stools  GERD (reflux/heartburn): 1. Continue taking your acid blocker as it seems to be working well for you 2. Let us know if you have any worsening or severe symptoms  Dysphagia (swallowing difficulties) with coughing while eating: 1. Continue to avoid using straws 2. It is very important to clear your throat every time you swallow food or liquids 3. Call us if you have any worsening or severe symptoms  Overall I recommend:  1. Continue your other current medications 2. Return for follow-up in 6 months 3. Call us if you have any questions or concerns   ---------------------------------------------------------------  COVID-19 Vaccine Information can be found at: [email or web address] For questions related to vaccine distribution or appointments, please email [email or web address] or call [phone].   ---------------------------------------------------------------   At [facility] we value your feedback. You may receive a survey about your visit today. Please share your experience as we strive to create trusting relationships with our patients to provide genuine, compassionate, quality care.  We appreciate your understanding and patience as we review any laboratory studies, imaging, and other diagnostic tests that are ordered as we care for you. Our office policy is 5 business days for review of these results, and any emergent or urgent results are addressed in a timely manner for your best interest. If you do not  hear from our office in 1 week, please contact us.   We also encourage the use of MyChart, which contains your medical information for your review as well. If you are not enrolled in this feature, an access code is on this after visit summary for your convenience. Thank you for allowing [place] to be involved in your care.  It was great to see you today!  I hope you have a great day!!

## 2019-05-10 NOTE — Progress Notes (Addendum)
Referring Provider: Abran Richard, MD Primary Care Physician:  Abran Richard, MD Primary GI:  Dr. Gala Romney  Chief Complaint  Patient presents with  . Constipation    Miralax and MOM not working    HPI:   [name] is a 69 y.o. female who presents for follow-up on GERD and constipation.  The patient was last seen in our office 03/07/2019 for GERD, dysphagia, drug-induced constipation.  History of chronic GERD, drug-induced constipation, esophageal stenosis.  Dysphagia typically worse if she is rushed to eat.  EGD updated September 2020 with reflux well controlled on PPI and dysphagia improved but not resolved.  Recent pulmonary office visit with asthma, dysphagia, community-acquired pneumonia.  Moderately persistent asthma that is poorly controlled with difficulty assessing symptoms but query worsening aspiration with a productive cough.  She was started on Augmentin and a chest x-ray and recommended follow-up for GI.  At her last visit chest x-ray was still pending, no recent BPE or speech therapy evaluation.  At her last visit she noted she does not have upper teeth and trying to chew his well as possible but still with solid food dysphagia, frequent coughing, coughing up food.  Not sure when dentures will be completed.  Occasional pill dysphagia with large pills but they passed with plenty of water.  Bowel movement every 3 to 4 days with hard stools and straining but she takes MiraLAX as needed for no bowel movement in couple days.  Felt to be effective when she takes it.  No other overt GI complaints.  Recommended chewing and swallowing precautions, evaluation with speech-language pathology, take MiraLAX every day or increase to twice a day as needed, continue Protonix, follow-up in 2 months.  Patient saw speech-language pathology on 03/16/2019.  See notes for details.  Some mild pharyngeal aspiration with wet quality that improved after clearing her throat.  Recommended regular  textures and thin liquids but no straws and standard aspiration reflux precautions.  Swallow 2 times for each bite/sip and clear throat periodically.  Follow-up with GI regarding small Zenker's diverticulum noted near CP segment that did not appear clinically significant.  Recommended sit upright 90 degrees and remain upright for 30 minutes after eating.  Patient states she's doing ok overall. Still with constipation despite MiraLAX. Goes about 3-4 days without a bowel movement. Stools sometimes hard, somewhat better if drinking enough fluids. Some straining. Denies hematochezia, melena. GERD doing well on PPI (Protonix) and no breakthrough if she avoids triggers, but it depends on who is cooking that day at her [place]. Denies N/V, fever, chills, unintentional weight loss. She tested COVID+ about 3 weeks ago and was admitted to [facility] in [place], [place] with double pneumonia. States she got the COVID vaccine but "had a reaction." Is feeling much better now. Good appetite, no dyspnea. Does have mild, rare cough typically only when she drinks liquids. Denies chest pain, dyspnea, dizziness, lightheadedness, syncope, near syncope. Denies any other upper or lower GI symptoms.  Reinforced precautions to clear throat after each swallow (foods and liquids); States she often forgets.  Past Medical History:  Diagnosis Date  . Allergic rhinitis    uses Nasonex daily as needed  . Allergy to angiotensin receptor blockers (ARB) 01/22/2014   Angioedema  . Anxiety    takes Ativan daily as needed  . Aspiration pneumonia ([place])   . Asthma    Albuterol inhaler and Neb daily as needed  . 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 [name], residual palpitations  . Bipolar 1 disorder ([place])   . Chronic back pain    reason unknown  . Constipation   . COPD (chronic obstructive pulmonary disease) ([place])   . DDD (degenerative disc disease), lumbar   . Depression      takes Zoloft daily as well as Cymbalta  . Diabetes mellitus    Type II  . Dysphagia   . Dysrhythmia    SVT ==ablation done by Dr. Rayann Heman 2007  . Esophageal reflux    takes Zantac daily  . Frequent headaches   . History of bronchitis Dec 2014  . Hypertension    takes Losartan daily  . Hypothyroidism    takes Synthroid daily  . Joint pain   . Joint swelling    left ankle  . Low sodium syndrome   . Lumbar radiculopathy   . Seizures ([place]) 04-05-13   one time ran out of Primidone and had stopped it cold [place]  . SVT (supraventricular tachycardia) ([place])   . Tremor, essential    takes Mysoline and Neurontin daily.  . Vocal cord dysfunction   . Weakness    in left arm    Past Surgical History:  Procedure Laterality Date  . ABDOMINAL HYSTERECTOMY    . ABLATION FOR AVNRT    . APPENDECTOMY    . BARTHOLIN GLAND CYST EXCISION     x 2  . BIOPSY  03/04/2015   Procedure: BIOPSY (Duodenal, Gastric);  Surgeon: Daneil Dolin, MD;  Location: [facility];  Service: Endoscopy;;  . BIOPSY  12/19/2018   Procedure: BIOPSY;  Surgeon: Daneil Dolin, MD;  [facility];  Service: Endoscopy;;  Gastric Biopsies    . BREAST SURGERY    . CESAREAN SECTION    . CHOLECYSTECTOMY    . COLONOSCOPY  01/16/2004   [id number] rectum/colon  . COLONOSCOPY WITH PROPOFOL N/A 03/04/2015   Dr.[name]- internal hemorrhoids, pancolonic diverticulosis, colonic polyp= tubular adenoma  . DEEP BRAIN STIMULATOR PLACEMENT    . DENTAL SURGERY    . ESOPHAGEAL DILATION N/A 03/04/2015   Procedure: ESOPHAGEAL DILATION WITH 54FR MALONEY DILATOR;  Surgeon: Daneil Dolin, MD;  Location: [facility];  Service: Endoscopy;  Laterality: N/A;  . ESOPHAGEAL DILATION  12/19/2018   Procedure: ESOPHAGEAL DILATION;  Surgeon: Daneil Dolin, MD;  [facility];  Service: Endoscopy;;  . ESOPHAGOGASTRODUODENOSCOPY (EGD) WITH ESOPHAGEAL DILATION  04/03/2002   [id number] ring, otherwise normal esophagus, status post  dilation with 54 F/Normal stomach  . ESOPHAGOGASTRODUODENOSCOPY (EGD) WITH ESOPHAGEAL DILATION N/A 11/08/2012   Dr. [name]:schatzki's ring s/p dilation/hiatal hernia  . ESOPHAGOGASTRODUODENOSCOPY (EGD) WITH PROPOFOL N/A 03/04/2015   Dr.[name]- schatzki's ring, hiatal hernia, scattered erosions bx= chronic inactive gastritis  . ESOPHAGOGASTRODUODENOSCOPY (EGD) WITH PROPOFOL N/A 05/06/2017   Normal esophagus. Dilated. Small hiatal hernia. Normal duodenal bulb and second portion of duodenum.  . ESOPHAGOGASTRODUODENOSCOPY (EGD) WITH PROPOFOL N/A 12/19/2018   Procedure: ESOPHAGOGASTRODUODENOSCOPY (EGD) WITH PROPOFOL;  Surgeon: Daneil Dolin, MD;  [facility];  Service: Endoscopy;  Laterality: N/A;  11:15am  . FINGER SURGERY    . HEMORRHOID BANDING  2017   Dr.[name]  . LEFT ANKLE LIGAMENT REPAIR     x 2  . LEFT ELBOW REPAIR    . MALONEY DILATION N/A 05/06/2017   Procedure: Venia Minks DILATION;  Surgeon: Daneil Dolin, MD;  [facility];  Service: Endoscopy;  Laterality: N/A;  . MALONEY DILATION  N/A 12/19/2018   Procedure: Venia Minks DILATION;  Surgeon: Daneil Dolin, MD;  [facility];  Service: Endoscopy;  Laterality: N/A;  . MAXIMUM ACCESS (MAS)POSTERIOR LUMBAR INTERBODY FUSION (PLIF) 1 LEVEL N/A 04/24/2014   Procedure: Lumbar four-five Maximum access Surgery posterior lumbar interbody fusion;  Surgeon: [name], MD;  Location: [place] [facility];  Service: Neurosurgery;  Laterality: N/A;  Lumbar four-five Maximum access Surgery posterior lumbar interbody fusion  . MULTIPLE EXTRACTIONS WITH ALVEOLOPLASTY N/A 06/18/2017   Procedure: MULTIPLE EXTRACTION;  Surgeon: Diona Browner, DDS;  Location: [place];  Service: Oral Surgery;  Laterality: N/A;  . PARTIAL HYSTERECTOMY    . POLYPECTOMY  03/04/2015   Procedure: POLYPECTOMY (descending colon);  Surgeon: Daneil Dolin, MD;  Location[facility];  Service: Endoscopy;;  . PULSE GENERATOR IMPLANT Bilateral 12/15/2013   Procedure:  BILATERAL PULSE GENERATOR IMPLANT;  Surgeon: [name], MD;  Location: [place] [facility];  Service: Neurosurgery;  Laterality: Bilateral;  BILATERAL  . RIGHT BREAST CYST     benign  . SUBTHALAMIC STIMULATOR BATTERY REPLACEMENT Bilateral 06/24/2018   Procedure: Bilateral implantable pulse generator battery change;  Surgeon: [name], MD;  Location: [facility];  Service: Neurosurgery;  Laterality: Bilateral;  bilateral  . SUBTHALAMIC STIMULATOR INSERTION Bilateral 12/04/2013   Procedure: Bilateral Deep brain stimulator placement;  Surgeon: [name], MD;  Location: [place] [facility];  Service: Neurosurgery;  Laterality: Bilateral;  Bilateral Deep brain stimulator placement  . TONSILLECTOMY      Current Outpatient Medications  Medication Sig Dispense Refill  . acetaminophen (TYLENOL) 500 MG tablet Take 500 mg by mouth every 4 (four) hours as needed for mild pain, fever or headache.     . albuterol (PROAIR HFA) 108 (90 Base) MCG/ACT inhaler Inhale 2 puffs every 6 hours for asthma - rescue inhaler (Patient taking differently: Inhale 2 puffs into the lungs every 6 (six) hours as needed for shortness of breath. ) 1 Inhaler 12  . albuterol (PROVENTIL) (2.5 MG/3ML) 0.083% nebulizer solution Take 2.5 mg by nebulization every 6 (six) hours as needed for wheezing or shortness of breath.    Marland Kitchen alum & mag hydroxide-simeth (MAALOX/MYLANTA) 200-200-20 MG/5ML suspension Take 30 mLs by mouth as needed for indigestion or heartburn. Max 4 doses per 24 hours    . aspirin 81 MG chewable tablet Chew 81 mg by mouth daily.     . baclofen (LIORESAL) 10 MG tablet Take 10 mg by mouth at bedtime.    . Carboxymethylcellulose Sod PF (REFRESH PLUS) 0.5 % SOLN Place 1 drop into both eyes 2 (two) times daily.     . Cholecalciferol (VITAMIN D-3) 125 MCG (5000 UT) TABS Take by mouth daily.    Marland Kitchen dextromethorphan (DELSYM) 30 MG/5ML liquid Take by mouth as needed for cough.    . divalproex (DEPAKOTE) 500 MG DR tablet Take 500 mg by mouth 3  (three) times daily.     Marland Kitchen docusate sodium (COLACE) 100 MG capsule Take 100 mg by mouth daily.    Marland Kitchen ENULOSE 10 GM/15ML SOLN Take 10 g by mouth daily as needed.     . fluticasone furoate-vilanterol (BREO ELLIPTA) 100-25 MCG/INH AEPB Inhale 1 puff into the lungs daily. (Patient taking differently: Inhale 1 puff into the lungs daily. RINSE MOUTH WITH WATER AFTER USE) 1 each 11  . gabapentin (NEURONTIN) 300 MG capsule Take 300-600 mg by mouth See admin instructions. Take 300 mg by mouth in the morning and 600 mg at bedtime    .  glucose 4 GM chewable tablet Chew 5 tablets by mouth as needed for low blood sugar.    . guaifenesin (ROBITUSSIN) 100 MG/5ML syrup Take 200 mg by mouth as needed for cough.    Marland Kitchen HYDROcodone-acetaminophen (NORCO/VICODIN) 5-325 MG tablet Take 1 tablet by mouth 3 (three) times daily.    . hydrocortisone 1 % ointment Apply 1 application topically 2 (two) times daily.    . insulin glargine (LANTUS) 100 UNIT/ML injection Inject 15 Units into the skin at bedtime. *Hold if blood glucose is less than 160*    . ipratropium (ATROVENT) 0.03 % nasal spray 1-2 puffs each nostril 3 times daily, before meals (Patient taking differently: Place 1 spray into both nostrils 3 (three) times daily before meals. ) 30 mL 11  . levothyroxine (SYNTHROID, LEVOTHROID) 75 MCG tablet TAKE (1) TABLET BY MOUTH ONCE DAILY BEFORE BREAKFAST. (Patient taking differently: Take 75 mcg by mouth daily before breakfast. ) 30 tablet 5  . loperamide (IMODIUM A-D) 2 MG tablet Take 2 mg by mouth 4 (four) times daily as needed for diarrhea or loose stools.     . magnesium hydroxide (MILK OF MAGNESIA) 400 MG/5ML suspension Take 30 mLs by mouth at bedtime as needed for mild constipation.     . Menthol, Topical Analgesic, (BIOFREEZE) 4 % GEL Apply 1 application topically 2 (two) times daily. Apply to lower back and base of right thumb twice daily.    . metoprolol succinate (TOPROL-XL) 50 MG 24 hr tablet Take 50 mg by mouth daily.  Take with or immediately following a meal.    . Multiple Vitamin (MULTIVITAMIN) capsule Take 1 capsule by mouth daily.    . Multiple Vitamins-Minerals (MULTIVITAMINS THER. W/MINERALS) TABS tablet TAKE ONE TABLET BY MOUTH ONCE DAILY. (Patient taking differently: Take 1 tablet by mouth daily. ) 30 each 11  . neomycin-bacitracin-polymyxin (NEOSPORIN) ointment Apply 1 application topically as needed for wound care.    Marland Kitchen NOVOLOG FLEXPEN 100 UNIT/ML FlexPen Inject 8 Units into the skin 3 (three) times daily before meals.     . Omega 3 1000 MG CAPS TAKE (1) CAPSULE BY MOUTH ONCE DAILY. (Patient taking differently: Take 1,000 mg by mouth daily. ) 30 capsule 11  . pantoprazole (PROTONIX) 40 MG tablet Take 40 mg by mouth daily.    Marland Kitchen PARoxetine (PAXIL) 10 MG tablet Take 10 mg by mouth daily.     Vladimir Faster Glycol-Propyl Glycol (SYSTANE ULTRA) 0.4-0.3 % SOLN Place 1 drop into both eyes 4 (four) times daily as needed (dry eyes).    . polyethylene glycol (MIRALAX / GLYCOLAX) packet Take 17 g by mouth daily as needed for moderate constipation. 14 each 0  . potassium chloride SA (K-DUR,KLOR-CON) 20 MEQ tablet Take 20 mEq by mouth daily.    . primidone (MYSOLINE) 50 MG tablet Take 50 mg by mouth daily.     . QUEtiapine (SEROQUEL) 100 MG tablet Take 100 mg by mouth at bedtime.    Marland Kitchen Respiratory Therapy Supplies (FLUTTER) DEVI Use flutter device 3 times a day 1 each 0  . simvastatin (ZOCOR) 5 MG tablet Take 5 mg by mouth at bedtime.     . sitaGLIPtin (JANUVIA) 100 MG tablet Take 100 mg by mouth daily.    . sodium chloride (OCEAN) 0.65 % SOLN nasal spray Take 1-2 sprays in nostril every hour as needed for rhinitis (Patient taking differently: Place 1 spray into both nostrils every hour as needed for congestion. ) 30 mL PRN  . sodium  chloride 1 g tablet Take 1 g by mouth 2 (two) times daily.    . sucralfate (CARAFATE) 1 g tablet Take 1 g by mouth 2 (two) times daily. ON AN EMPTY STOMACH     No current  facility-administered medications for this visit.    Allergies as of 05/10/2019 - Review Complete 05/10/2019  Allergen Reaction Noted  . Invokana [canagliflozin] Other (See Comments) 12/05/2014  . Losartan Other (See Comments) 01/22/2014  . Metformin and related Diarrhea 01/18/2013  . Latex Other (See Comments) 07/02/2014  . Other Other (See Comments) 07/02/2014  . Oxycodone-acetaminophen Itching and Other (See Comments) 05/11/2011  . Mucinex [guaifenesin er] Other (See Comments)     Family History  Problem Relation Age of Onset  . Lung cancer Father        DIED AGE 74 LUNG CA  . Alcohol abuse Father   . Anxiety disorder Father   . Depression Father   . COPD Mother        DIED AGE 21,MRSA,COPD,PNEUMONIA  . Pneumonia Mother        DIED AGE 21,MRSA,COPD,PNEUMONIA  . Supraventricular tachycardia Mother   . Anxiety disorder Mother   . Depression Mother   . Alcohol abuse Mother   . Heart disease Sister        DIED AGE 63, SMOKER,?HEART  . COPD Brother        AGE 7  . Colon cancer Neg Hx     Social History   Socioeconomic History  . Marital status: Legally Separated    Spouse name: Not on file  . Number of children: Not on file  . Years of education: Not on file  . Highest education level: Bachelor's degree (e.g., BA, AB, BS)  Occupational History  . Occupation: retired  Tobacco Use  . Smoking status: Former Smoker    Packs/day: 0.30    Years: 10.00    Pack years: 3.00    Types: Cigarettes    Quit date: 04/23/1993    Years since quitting: 26.0  . Smokeless tobacco: Never Used  . Tobacco comment: quit smoking 25+yrs ago  Substance and Sexual Activity  . Alcohol use: No    Alcohol/week: 0.0 standard drinks  . Drug use: No  . Sexual activity: Not Currently  Other Topics Concern  . Not on file  Social History Narrative   Married, 1 daughte, living.  Lives with husband in [place], [place].   1 child died brain tumor at age 7.   Two grandchildren.   Works at  [employer], patient currently lost her job.   Retired 2011.   No tobacco.   Alcohol: none in 30 yrs.  Distant history of heavy use.   No drug use.   Social Determinants of Health   Financial Resource Strain:   . Difficulty of Paying Living Expenses: Not on file  Food Insecurity:   . Worried About Charity fundraiser in the Last Year: Not on file  . Ran Out of Food in the Last Year: Not on file  Transportation Needs:   . Lack of Transportation (Medical): Not on file  . Lack of Transportation (Non-Medical): Not on file  Physical Activity:   . Days of Exercise per Week: Not on file  . Minutes of Exercise per Session: Not on file  Stress:   . Feeling of Stress : Not on file  Social Connections:   . Frequency of Communication with Friends and Family: Not on file  . Frequency of  Social Gatherings with Friends and Family: Not on file  . Attends Religious Services: Not on file  . Active Member of Clubs or Organizations: Not on file  . Attends Archivist Meetings: Not on file  . Marital Status: Not on file    Review of Systems: General: Negative for anorexia, weight loss, fever, chills, fatigue, weakness. ENT: Negative for hoarseness, difficulty swallowing. CV: Negative for chest pain, angina, palpitations, peripheral edema.  Respiratory: Negative for dyspnea at rest, sputum, wheezing.  GI: See history of present illness.  MS: Negative for joint pain, low back pain.  Endo: Negative for unusual weight change.  Heme: Negative for bruising or bleeding. Allergy: Negative for rash or hives.   Physical Exam: BP (!) 155/87   Pulse 80   Temp (!) 96.9 F (36.1 C) (Temporal)   Ht 5\' 4"  (1.626 m)   Wt 191 lb 3.2 oz (86.7 kg)   BMI 32.82 kg/m  General:   Alert and oriented. Pleasant and cooperative. Well-nourished and well-developed.  Eyes:  Without icterus, sclera clear and conjunctiva pink.  Ears:  Normal auditory acuity. Cardiovascular:  S1, S2 present  without murmurs appreciated. Extremities without clubbing or edema. Respiratory:  Noted very mild end expiratory wheeze bilaterally. No rales, or rhonchi. No distress.  Gastrointestinal:  +BS, soft, non-tender and non-distended. No HSM noted. No guarding or rebound. No masses appreciated.  Rectal:  Deferred  Musculoskalatal:  Symmetrical without gross deformities. Neurologic:  Alert and oriented x4;  grossly normal neurologically. Psych:  Alert and cooperative. Normal mood and affect. Heme/Lymph/Immune: No excessive bruising noted.    05/10/2019 12:57 PM   Disclaimer: This note was dictated with voice recognition software. Similar sounding words can inadvertently be transcribed and may not be corrected upon review.

## 2019-05-10 NOTE — Assessment & Plan Note (Signed)
GERD currently doing well on PPI.  Recommend she continue her current medications and follow-up in 6 months.

## 2019-05-10 NOTE — Addendum Note (Signed)
Addended by: Gordy Levan, Marlana Mckowen A on: 05/10/2019 12:57 PM   Modules accepted: Orders

## 2019-05-10 NOTE — Assessment & Plan Note (Signed)
Constipation is not adequately controlled with MiraLAX and milk of magnesia.  Will often go 3 to 4 days without a bowel movement and typically has hard stools with straining, although she is drinking lots of water her stools will be a bit softer.  Still no frequent bowel movements.  I think this point I will have her start OIC directed medication in Amitiza 24 mcg twice daily.  She was previously on this but it has been a few years.  She is also previously been on Movantik as well.  If no improvement with Amitiza in addition to MiraLAX then we can switch to [place] and possibly Motegrity.  Request progress for 1 to 2 weeks.  Follow-up in 6 months.

## 2019-05-11 ENCOUNTER — Telehealth: Payer: Self-pay | Admitting: Internal Medicine

## 2019-05-11 NOTE — Telephone Encounter (Signed)
[facility] called to say that they needed an order for the Amitiza  the patient was put on yesterday. She asked for [place] to fax it to [phone]

## 2019-05-11 NOTE — Telephone Encounter (Signed)
Spoke with pts nurse. She has received the fax from our office.

## 2019-05-15 ENCOUNTER — Telehealth: Payer: Self-pay | Admitting: Internal Medicine

## 2019-05-15 MED ORDER — LUBIPROSTONE 24 MCG PO CAPS: 24.0000 ug | ORAL_CAPSULE | Freq: Two times a day (BID) | ORAL | 3 refills | Status: DC

## 2019-05-15 NOTE — Addendum Note (Signed)
Addended by: Annitta Needs on: 05/15/2019 11:25 AM   Modules accepted: Orders

## 2019-05-15 NOTE — Telephone Encounter (Signed)
Completed.

## 2019-05-15 NOTE — Telephone Encounter (Signed)
ERROR

## 2019-05-15 NOTE — Telephone Encounter (Signed)
Patient called and said the samples eric gave her worked and  needs her prescription sent to [facility] [place]

## 2019-05-15 NOTE — Telephone Encounter (Signed)
Amitiza 24 mcg is the sample pt was given at her apt last week. Rx is working and pt would like it sent to her pharmacy. Please advise in the absence of EG.

## 2019-05-18 ENCOUNTER — Telehealth: Payer: Self-pay | Admitting: Internal Medicine

## 2019-05-18 MED ORDER — DOXYCYCLINE HYCLATE 100 MG PO TABS: ORAL_TABLET | ORAL | 0 refills | Status: DC

## 2019-05-18 NOTE — Telephone Encounter (Signed)
[facility] but unable to speak to anyone there about pt. Left message for someone to return call.

## 2019-05-18 NOTE — Telephone Encounter (Signed)
Andreas Ohm, NT routed conversation to [employer] 11 minutes ago (4:20 PM)  [place], [place] [phone]  Andreas Ohm, NT 12 minutes ago (4:20 PM)   pt calling returning a phone call because she had not heard back about her having asthma problems   Incoming call    Spoke with the pt  She is c/o increased cough with thick yellow sputum, wheezing x 3 days  Her cough is prod with thick, yellow sputum  She has had no fever- temp today is 97.0  No chills, SOB, body aches  She is taking robitussin 3 x daily without any relief  Please advise thanks! Allergies  Allergen Reactions  . Invokana [Canagliflozin] Other (See Comments)    Yeast infectios  . Losartan Other (See Comments)    Angioedema   . Metformin And Related Diarrhea  . Latex Other (See Comments)    Other Reaction: redness, blisters  . Other Other (See Comments)    Air fresheners - on MAR  . Oxycodone-Acetaminophen Itching and Other (See Comments)    TYLOX. Caused internal itching per patient   . Mucinex [Guaifenesin Er] Other (See Comments)    MUCINEX REACTION: asthma attacks   Current Outpatient Medications on File Prior to Visit  Medication Sig Dispense Refill  . acetaminophen (TYLENOL) 500 MG tablet Take 500 mg by mouth every 4 (four) hours as needed for mild pain, fever or headache.     . albuterol (PROAIR HFA) 108 (90 Base) MCG/ACT inhaler Inhale 2 puffs every 6 hours for asthma - rescue inhaler (Patient taking differently: Inhale 2 puffs into the lungs every 6 (six) hours as needed for shortness of breath. ) 1 Inhaler 12  . albuterol (PROVENTIL) (2.5 MG/3ML) 0.083% nebulizer solution Take 2.5 mg by nebulization every 6 (six) hours as needed for wheezing or shortness of breath.    Marland Kitchen alum & mag hydroxide-simeth (MAALOX/MYLANTA) 200-200-20 MG/5ML suspension Take 30 mLs by mouth as needed for indigestion or heartburn. Max 4 doses per 24 hours    . aspirin 81 MG chewable tablet Chew 81 mg by mouth daily.      . baclofen (LIORESAL) 10 MG tablet Take 10 mg by mouth at bedtime.    . Carboxymethylcellulose Sod PF (REFRESH PLUS) 0.5 % SOLN Place 1 drop into both eyes 2 (two) times daily.     . Cholecalciferol (VITAMIN D-3) 125 MCG (5000 UT) TABS Take by mouth daily.    Marland Kitchen dextromethorphan (DELSYM) 30 MG/5ML liquid Take by mouth as needed for cough.    . divalproex (DEPAKOTE) 500 MG DR tablet Take 500 mg by mouth 3 (three) times daily.     Marland Kitchen docusate sodium (COLACE) 100 MG capsule Take 100 mg by mouth daily.    Marland Kitchen ENULOSE 10 GM/15ML SOLN Take 10 g by mouth daily as needed.     . fluticasone furoate-vilanterol (BREO ELLIPTA) 100-25 MCG/INH AEPB Inhale 1 puff into the lungs daily. (Patient taking differently: Inhale 1 puff into the lungs daily. RINSE MOUTH WITH WATER AFTER USE) 1 each 11  . gabapentin (NEURONTIN) 300 MG capsule Take 300-600 mg by mouth See admin instructions. Take 300 mg by mouth in the morning and 600 mg at bedtime    . glucose 4 GM chewable tablet Chew 5 tablets by mouth as needed for low blood sugar.    . guaifenesin (ROBITUSSIN) 100 MG/5ML syrup Take 200 mg by mouth as needed for cough.    Marland Kitchen HYDROcodone-acetaminophen (NORCO/VICODIN) 5-325 MG tablet Take  1 tablet by mouth 3 (three) times daily.    . hydrocortisone 1 % ointment Apply 1 application topically 2 (two) times daily.    . insulin glargine (LANTUS) 100 UNIT/ML injection Inject 15 Units into the skin at bedtime. *Hold if blood glucose is less than 160*    . ipratropium (ATROVENT) 0.03 % nasal spray 1-2 puffs each nostril 3 times daily, before meals (Patient taking differently: Place 1 spray into both nostrils 3 (three) times daily before meals. ) 30 mL 11  . levothyroxine (SYNTHROID, LEVOTHROID) 75 MCG tablet TAKE (1) TABLET BY MOUTH ONCE DAILY BEFORE BREAKFAST. (Patient taking differently: Take 75 mcg by mouth daily before breakfast. ) 30 tablet 5  . loperamide (IMODIUM A-D) 2 MG tablet Take 2 mg by mouth 4 (four) times daily as  needed for diarrhea or loose stools.     . lubiprostone (AMITIZA) 24 MCG capsule Take 1 capsule (24 mcg total) by mouth 2 (two) times daily with a meal. 60 capsule 3  . magnesium hydroxide (MILK OF MAGNESIA) 400 MG/5ML suspension Take 30 mLs by mouth at bedtime as needed for mild constipation.     . Menthol, Topical Analgesic, (BIOFREEZE) 4 % GEL Apply 1 application topically 2 (two) times daily. Apply to lower back and base of right thumb twice daily.    . metoprolol succinate (TOPROL-XL) 50 MG 24 hr tablet Take 50 mg by mouth daily. Take with or immediately following a meal.    . Multiple Vitamin (MULTIVITAMIN) capsule Take 1 capsule by mouth daily.    . Multiple Vitamins-Minerals (MULTIVITAMINS THER. W/MINERALS) TABS tablet TAKE ONE TABLET BY MOUTH ONCE DAILY. (Patient taking differently: Take 1 tablet by mouth daily. ) 30 each 11  . neomycin-bacitracin-polymyxin (NEOSPORIN) ointment Apply 1 application topically as needed for wound care.    Marland Kitchen NOVOLOG FLEXPEN 100 UNIT/ML FlexPen Inject 8 Units into the skin 3 (three) times daily before meals.     . Omega 3 1000 MG CAPS TAKE (1) CAPSULE BY MOUTH ONCE DAILY. (Patient taking differently: Take 1,000 mg by mouth daily. ) 30 capsule 11  . pantoprazole (PROTONIX) 40 MG tablet Take 40 mg by mouth daily.    Marland Kitchen PARoxetine (PAXIL) 10 MG tablet Take 10 mg by mouth daily.     Vladimir Faster Glycol-Propyl Glycol (SYSTANE ULTRA) 0.4-0.3 % SOLN Place 1 drop into both eyes 4 (four) times daily as needed (dry eyes).    . polyethylene glycol (MIRALAX / GLYCOLAX) packet Take 17 g by mouth daily as needed for moderate constipation. 14 each 0  . potassium chloride SA (K-DUR,KLOR-CON) 20 MEQ tablet Take 20 mEq by mouth daily.    . primidone (MYSOLINE) 50 MG tablet Take 50 mg by mouth daily.     . QUEtiapine (SEROQUEL) 100 MG tablet Take 100 mg by mouth at bedtime.    Marland Kitchen Respiratory Therapy Supplies (FLUTTER) DEVI Use flutter device 3 times a day 1 each 0  . simvastatin  (ZOCOR) 5 MG tablet Take 5 mg by mouth at bedtime.     . sitaGLIPtin (JANUVIA) 100 MG tablet Take 100 mg by mouth daily.    . sodium chloride (OCEAN) 0.65 % SOLN nasal spray Take 1-2 sprays in nostril every hour as needed for rhinitis (Patient taking differently: Place 1 spray into both nostrils every hour as needed for congestion. ) 30 mL PRN  . sodium chloride 1 g tablet Take 1 g by mouth 2 (two) times daily.    . sucralfate (  CARAFATE) 1 g tablet Take 1 g by mouth 2 (two) times daily. ON AN EMPTY STOMACH     No current facility-administered medications on file prior to visit.

## 2019-05-18 NOTE — Telephone Encounter (Signed)
Called and spoke to pt. Informed her of the recs per CY. Rx sent to preferred pharmacy. Pt verbalized understanding and denied any further questions or concerns at this time.   

## 2019-05-18 NOTE — Telephone Encounter (Signed)
Suggest offering doxycycline 100 mg, # 8, 2 today then one daily

## 2019-06-21 ENCOUNTER — Ambulatory Visit: Payer: Medicare Other | Admitting: Nurse Practitioner

## 2019-06-22 ENCOUNTER — Telehealth: Payer: Self-pay | Admitting: Internal Medicine

## 2019-06-27 ENCOUNTER — Other Ambulatory Visit: Payer: Self-pay

## 2019-06-27 ENCOUNTER — Encounter: Payer: Self-pay | Admitting: Nurse Practitioner

## 2019-06-27 ENCOUNTER — Ambulatory Visit (INDEPENDENT_AMBULATORY_CARE_PROVIDER_SITE_OTHER): Payer: Medicare Other | Admitting: Nurse Practitioner

## 2019-06-27 VITALS — BP 136/85 | HR 86 | Temp 97.3°F | Ht 64.0 in | Wt 184.0 lb

## 2019-06-27 DIAGNOSIS — R1310 Dysphagia, unspecified: Secondary | ICD-10-CM

## 2019-06-27 DIAGNOSIS — K581 Irritable bowel syndrome with constipation: Secondary | ICD-10-CM | POA: Diagnosis not present

## 2019-06-27 MED ORDER — LUBIPROSTONE 8 MCG PO CAPS: 8.0000 ug | ORAL_CAPSULE | Freq: Two times a day (BID) | ORAL | 3 refills | Status: DC

## 2019-06-27 NOTE — Assessment & Plan Note (Signed)
History of IBS mixed type.  Currently she is having more diarrhea.  We have previously asked by the facility to change her Amitiza to "as needed".  However, there is no FDA indication for as needed Amitiza.  She is currently on 24 mcg daily.  However, when I investigated her more further she is also getting daily MiraLAX, daily stool softener, as needed MiraLAX.  At this point I recommended reducing her Amitiza dose to 8 mcg twice daily.  Change MiraLAX and Colace to as needed only.  If she is having diarrhea, hold MiraLAX and Colace.  If she becomes more constipated these can be added back and as needed.  Return for follow-up in 6 months.

## 2019-06-27 NOTE — Progress Notes (Signed)
Referring Provider: Abran Richard, MD Primary Care Physician:  Abran Richard, MD Primary GI:  Dr. Gala Romney  Chief Complaint  Patient presents with  . Dysphagia  . Diarrhea    on and off    HPI:   [name] is a 69 y.o. female who presents for follow-up on dysphagia.  The patient was last seen in our office 05/10/2019 for GERD, drug-induced constipation, dysphagia.  Noted history of chronic GERD, drug-induced constipation, esophageal stenosis.  Dysphagia typically worse if she is rushed to eat.  EGD in September 2020 with reflux well controlled on PPI and dysphagia improved but not resolved.  Prior to her last visit she had a recent pulmonary office visit with asthma and dysphagia with community-acquired pneumonia.  Recommended chest x-ray and follow-up with GI, started on Augmentin.  Noted no recent BPE or speech therapy evaluation.  Chronic issues with dentition.  Speech-language pathology evaluation on 03/16/2019 with noted mild pharyngeal aspiration with wet quality that improved after clearing of throat and recommended regular textures and thin liquids but no straws and standard aspiration reflux precautions.  Swallow 2 times for each bite/sip and clear throat periodically.  Follow-up with GI regarding small Zenker's diverticulum noted near CP segment that did not appear clinically significant.  Recommended sit upright 90 degrees and remain upright for 30 minutes after eating.  At her last visit noted constipation despite MiraLAX with 3 to 4 days between bowel movements that are sometimes hard, occasional straining.  Somewhat improved with enough fluids.  GERD doing well on Protonix with no breakthrough if she avoids triggers.  Previous Covid +3 weeks prior and admission to [facility] in [place], [place] with double pneumonia.  Chewing and swallowing precautions reinforced, indicates she often forgets.  Recommended Amitiza 24 mcg twice a day, MiraLAX once a day, hold MiraLAX for  diarrhea, progress report 1 to 2 weeks, continue PPI, reinforced dysphagia precautions, follow-up in 6 months.  Today she states she's doing ok overall. Has intermittent diarrhea, has been given MiraLAX despite diarrhea and previous recommendations; also on daily stool softener. Has been refusing Amitiza as well due to diarrhea. Having RLQ pain for the past couple weeks. Pain is intermittent, triggers include standing to transfer (ie- commode, bed, etc). She is not receiving any OTC pain medications for pain although does have Hydrocodone for back pain. Intermittent/rare solid food dysphagia; SLP precautions in place as per above. Denies N/V, hematochezia, melena, fever, chills, unintentional weight loss. Denies URI or flu-like symptoms. Denies loss of sense of taste or smell. She has received her COVID-19 vaccine. Denies chest pain, dyspnea, dizziness, lightheadedness, syncope, near syncope. Denies any other upper or lower GI symptoms.  Past Medical History:  Diagnosis Date  . Allergic rhinitis    uses Nasonex daily as needed  . Allergy to angiotensin receptor blockers (ARB) 01/22/2014   Angioedema  . Anxiety    takes Ativan daily as needed  . Aspiration pneumonia ([place])   . Asthma    Albuterol inhaler and Neb daily as needed  . 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 [name], residual palpitations  . Bipolar 1 disorder ([place])   . Chronic back pain    reason unknown  . Constipation   . COPD (chronic obstructive pulmonary disease) ([place])   . DDD (degenerative disc disease), lumbar   . Depression    takes Zoloft daily as well as Cymbalta  . Diabetes mellitus  Type II  . Dysphagia   . Dysrhythmia    SVT ==ablation done by Dr. Rayann Heman 2007  . Esophageal reflux    takes Zantac daily  . Frequent headaches   . History of bronchitis Dec 2014  . Hypertension    takes Losartan daily  . Hypothyroidism    takes Synthroid daily  . Joint pain   .  Joint swelling    left ankle  . Low sodium syndrome   . Lumbar radiculopathy   . Seizures ([place]) 04-05-13   one time ran out of Primidone and had stopped it cold [place]  . SVT (supraventricular tachycardia) ([place])   . Tremor, essential    takes Mysoline and Neurontin daily.  . Vocal cord dysfunction   . Weakness    in left arm    Past Surgical History:  Procedure Laterality Date  . ABDOMINAL HYSTERECTOMY    . ABLATION FOR AVNRT    . APPENDECTOMY    . BARTHOLIN GLAND CYST EXCISION     x 2  . BIOPSY  03/04/2015   Procedure: BIOPSY (Duodenal, Gastric);  Surgeon: Daneil Dolin, MD;  Location: [facility];  Service: Endoscopy;;  . BIOPSY  12/19/2018   Procedure: BIOPSY;  Surgeon: Daneil Dolin, MD;  [facility];  Service: Endoscopy;;  Gastric Biopsies    . BREAST SURGERY    . CESAREAN SECTION    . CHOLECYSTECTOMY    . COLONOSCOPY  01/16/2004   [id number] rectum/colon  . COLONOSCOPY WITH PROPOFOL N/A 03/04/2015   Dr.[name]- internal hemorrhoids, pancolonic diverticulosis, colonic polyp= tubular adenoma  . DEEP BRAIN STIMULATOR PLACEMENT    . DENTAL SURGERY    . ESOPHAGEAL DILATION N/A 03/04/2015   Procedure: ESOPHAGEAL DILATION WITH 54FR MALONEY DILATOR;  Surgeon: Daneil Dolin, MD;  Location: [facility];  Service: Endoscopy;  Laterality: N/A;  . ESOPHAGEAL DILATION  12/19/2018   Procedure: ESOPHAGEAL DILATION;  Surgeon: Daneil Dolin, MD;  [facility];  Service: Endoscopy;;  . ESOPHAGOGASTRODUODENOSCOPY (EGD) WITH ESOPHAGEAL DILATION  04/03/2002   [id number] ring, otherwise normal esophagus, status post dilation with 54 F/Normal stomach  . ESOPHAGOGASTRODUODENOSCOPY (EGD) WITH ESOPHAGEAL DILATION N/A 11/08/2012   Dr. [name]:schatzki's ring s/p dilation/hiatal hernia  . ESOPHAGOGASTRODUODENOSCOPY (EGD) WITH PROPOFOL N/A 03/04/2015   Dr.[name]- schatzki's ring, hiatal hernia, scattered erosions bx= chronic inactive gastritis  . ESOPHAGOGASTRODUODENOSCOPY (EGD)  WITH PROPOFOL N/A 05/06/2017   Normal esophagus. Dilated. Small hiatal hernia. Normal duodenal bulb and second portion of duodenum.  . ESOPHAGOGASTRODUODENOSCOPY (EGD) WITH PROPOFOL N/A 12/19/2018   Procedure: ESOPHAGOGASTRODUODENOSCOPY (EGD) WITH PROPOFOL;  Surgeon: Daneil Dolin, MD;  [facility];  Service: Endoscopy;  Laterality: N/A;  11:15am  . FINGER SURGERY    . HEMORRHOID BANDING  2017   Dr.[name]  . LEFT ANKLE LIGAMENT REPAIR     x 2  . LEFT ELBOW REPAIR    . MALONEY DILATION N/A 05/06/2017   Procedure: Venia Minks DILATION;  Surgeon: Daneil Dolin, MD;  [facility];  Service: Endoscopy;  Laterality: N/A;  . Venia Minks DILATION N/A 12/19/2018   Procedure: Venia Minks DILATION;  Surgeon: Daneil Dolin, MD;  [facility];  Service: Endoscopy;  Laterality: N/A;  . MAXIMUM ACCESS (MAS)POSTERIOR LUMBAR INTERBODY FUSION (PLIF) 1 LEVEL N/A 04/24/2014   Procedure: Lumbar four-five Maximum access Surgery posterior lumbar interbody fusion;  Surgeon: [name], MD;  Location: [facility];  Service: Neurosurgery;  Laterality: N/A;  Lumbar four-five Maximum access  Surgery posterior lumbar interbody fusion  . MULTIPLE EXTRACTIONS WITH ALVEOLOPLASTY N/A 06/18/2017   Procedure: MULTIPLE EXTRACTION;  Surgeon: Diona Browner, DDS;  Location: [facility];  Service: Oral Surgery;  Laterality: N/A;  . PARTIAL HYSTERECTOMY    . POLYPECTOMY  03/04/2015   Procedure: POLYPECTOMY (descending colon);  Surgeon: Daneil Dolin, MD;  Location[facility];  Service: Endoscopy;;  . PULSE GENERATOR IMPLANT Bilateral 12/15/2013   Procedure: BILATERAL PULSE GENERATOR IMPLANT;  Surgeon: [name], MD;  Location: [facility];  Service: Neurosurgery;  Laterality: Bilateral;  BILATERAL  . RIGHT BREAST CYST     benign  . SUBTHALAMIC STIMULATOR BATTERY REPLACEMENT Bilateral 06/24/2018   Procedure: Bilateral implantable pulse generator battery change;  Surgeon: [name], MD;  Location: [facility];   Service: Neurosurgery;  Laterality: Bilateral;  bilateral  . SUBTHALAMIC STIMULATOR INSERTION Bilateral 12/04/2013   Procedure: Bilateral Deep brain stimulator placement;  Surgeon: [name], MD;  Location: [facility];  Service: Neurosurgery;  Laterality: Bilateral;  Bilateral Deep brain stimulator placement  . TONSILLECTOMY      Current Outpatient Medications  Medication Sig Dispense Refill  . acetaminophen (TYLENOL) 500 MG tablet Take 500 mg by mouth every 4 (four) hours as needed for mild pain, fever or headache.     . albuterol (PROAIR HFA) 108 (90 Base) MCG/ACT inhaler Inhale 2 puffs every 6 hours for asthma - rescue inhaler (Patient taking differently: Inhale 2 puffs into the lungs every 6 (six) hours as needed for shortness of breath. ) 1 Inhaler 12  . albuterol (PROVENTIL) (2.5 MG/3ML) 0.083% nebulizer solution Take 2.5 mg by nebulization every 6 (six) hours as needed for wheezing or shortness of breath.    Marland Kitchen alum & mag hydroxide-simeth (MAALOX/MYLANTA) 200-200-20 MG/5ML suspension Take 30 mLs by mouth as needed for indigestion or heartburn. Max 4 doses per 24 hours    . aspirin 81 MG chewable tablet Chew 81 mg by mouth daily.     . baclofen (LIORESAL) 10 MG tablet Take 10 mg by mouth at bedtime.    . Carboxymethylcellulose Sod PF (REFRESH PLUS) 0.5 % SOLN Place 1 drop into both eyes 2 (two) times daily.     . Cholecalciferol (VITAMIN D-3) 125 MCG (5000 UT) TABS Take by mouth daily.    Marland Kitchen dextromethorphan (DELSYM) 30 MG/5ML liquid Take by mouth as needed for cough.    . divalproex (DEPAKOTE) 500 MG DR tablet Take 500 mg by mouth 3 (three) times daily.     Marland Kitchen docusate sodium (COLACE) 100 MG capsule Take 100 mg by mouth daily.    Marland Kitchen ENULOSE 10 GM/15ML SOLN Take 10 g by mouth daily as needed.     . fluticasone furoate-vilanterol (BREO ELLIPTA) 100-25 MCG/INH AEPB Inhale 1 puff into the lungs daily. (Patient taking differently: Inhale 1 puff into the lungs daily. RINSE MOUTH WITH WATER AFTER  USE) 1 each 11  . gabapentin (NEURONTIN) 300 MG capsule Take 300-600 mg by mouth See admin instructions. Take 300 mg by mouth in the morning and 600 mg at bedtime    . glucose 4 GM chewable tablet Chew 5 tablets by mouth as needed for low blood sugar.    . guaifenesin (ROBITUSSIN) 100 MG/5ML syrup Take 200 mg by mouth as needed for cough.    Marland Kitchen HYDROcodone-acetaminophen (NORCO/VICODIN) 5-325 MG tablet Take 1 tablet by mouth 3 (three) times daily.    . hydrocortisone 1 % ointment Apply 1 application topically 2 (two) times daily.    Marland Kitchen  insulin glargine (LANTUS) 100 UNIT/ML injection Inject 15 Units into the skin at bedtime. *Hold if blood glucose is less than 160*    . ipratropium (ATROVENT) 0.03 % nasal spray 1-2 puffs each nostril 3 times daily, before meals (Patient taking differently: Place 1 spray into both nostrils 3 (three) times daily before meals. ) 30 mL 11  . levothyroxine (SYNTHROID, LEVOTHROID) 75 MCG tablet TAKE (1) TABLET BY MOUTH ONCE DAILY BEFORE BREAKFAST. (Patient taking differently: Take 75 mcg by mouth daily before breakfast. ) 30 tablet 5  . loperamide (IMODIUM A-D) 2 MG tablet Take 2 mg by mouth 4 (four) times daily as needed for diarrhea or loose stools.     . magnesium hydroxide (MILK OF MAGNESIA) 400 MG/5ML suspension Take 30 mLs by mouth at bedtime as needed for mild constipation.     . Menthol, Topical Analgesic, (BIOFREEZE) 4 % GEL Apply 1 application topically 2 (two) times daily. Apply to lower back and base of right thumb twice daily.    . metoprolol succinate (TOPROL-XL) 50 MG 24 hr tablet Take 50 mg by mouth daily. Take with or immediately following a meal.    . Multiple Vitamin (MULTIVITAMIN) capsule Take 1 capsule by mouth daily.    . Multiple Vitamins-Minerals (MULTIVITAMINS THER. W/MINERALS) TABS tablet TAKE ONE TABLET BY MOUTH ONCE DAILY. (Patient taking differently: Take 1 tablet by mouth daily. ) 30 each 11  . neomycin-bacitracin-polymyxin (NEOSPORIN) ointment  Apply 1 application topically as needed for wound care.    Marland Kitchen NOVOLOG FLEXPEN 100 UNIT/ML FlexPen Inject 8 Units into the skin 3 (three) times daily before meals.     . Omega 3 1000 MG CAPS TAKE (1) CAPSULE BY MOUTH ONCE DAILY. (Patient taking differently: Take 1,000 mg by mouth daily. ) 30 capsule 11  . pantoprazole (PROTONIX) 40 MG tablet Take 40 mg by mouth daily.    Marland Kitchen PARoxetine (PAXIL) 10 MG tablet Take 10 mg by mouth daily.     Vladimir Faster Glycol-Propyl Glycol (SYSTANE ULTRA) 0.4-0.3 % SOLN Place 1 drop into both eyes 4 (four) times daily as needed (dry eyes).    . polyethylene glycol (MIRALAX / GLYCOLAX) 17 g packet Take 17 g by mouth daily.    . polyethylene glycol (MIRALAX / GLYCOLAX) packet Take 17 g by mouth daily as needed for moderate constipation. 14 each 0  . potassium chloride SA (K-DUR,KLOR-CON) 20 MEQ tablet Take 20 mEq by mouth daily.    . primidone (MYSOLINE) 50 MG tablet Take 50 mg by mouth daily.     . QUEtiapine (SEROQUEL) 100 MG tablet Take 100 mg by mouth at bedtime.    Marland Kitchen Respiratory Therapy Supplies (FLUTTER) DEVI Use flutter device 3 times a day 1 each 0  . simvastatin (ZOCOR) 5 MG tablet Take 5 mg by mouth at bedtime.     . sitaGLIPtin (JANUVIA) 100 MG tablet Take 100 mg by mouth daily.    . sodium chloride (OCEAN) 0.65 % SOLN nasal spray Take 1-2 sprays in nostril every hour as needed for rhinitis (Patient taking differently: Place 1 spray into both nostrils every hour as needed for congestion. ) 30 mL PRN  . sodium chloride 1 g tablet Take 1 g by mouth 2 (two) times daily.    . sucralfate (CARAFATE) 1 g tablet Take 1 g by mouth 2 (two) times daily. ON AN EMPTY STOMACH    . zinc gluconate 50 MG tablet Take 50 mg by mouth daily.    Marland Kitchen  lubiprostone (AMITIZA) 8 MCG capsule Take 1 capsule (8 mcg total) by mouth 2 (two) times daily with a meal. 180 capsule 3   No current facility-administered medications for this visit.    Allergies as of 06/27/2019 - Review Complete  06/27/2019  Allergen Reaction Noted  . Invokana [canagliflozin] Other (See Comments) 12/05/2014  . Losartan Other (See Comments) 01/22/2014  . Metformin and related Diarrhea 01/18/2013  . Latex Other (See Comments) 07/02/2014  . Other Other (See Comments) 07/02/2014  . Oxycodone-acetaminophen Itching and Other (See Comments) 05/11/2011  . Mucinex [guaifenesin er] Other (See Comments)     Family History  Problem Relation Age of Onset  . Lung cancer Father        DIED AGE 20 LUNG CA  . Alcohol abuse Father   . Anxiety disorder Father   . Depression Father   . COPD Mother        DIED AGE 44,MRSA,COPD,PNEUMONIA  . Pneumonia Mother        DIED AGE 44,MRSA,COPD,PNEUMONIA  . Supraventricular tachycardia Mother   . Anxiety disorder Mother   . Depression Mother   . Alcohol abuse Mother   . Heart disease Sister        DIED AGE 54, SMOKER,?HEART  . COPD Brother        AGE 29  . Colon cancer Neg Hx     Social History   Socioeconomic History  . Marital status: Legally Separated    Spouse name: Not on file  . Number of children: Not on file  . Years of education: Not on file  . Highest education level: Bachelor's degree (e.g., BA, AB, BS)  Occupational History  . Occupation: retired  Tobacco Use  . Smoking status: Former Smoker    Packs/day: 0.30    Years: 10.00    Pack years: 3.00    Types: Cigarettes    Quit date: 04/23/1993    Years since quitting: 26.1  . Smokeless tobacco: Never Used  . Tobacco comment: quit smoking 25+yrs ago  Substance and Sexual Activity  . Alcohol use: No    Alcohol/week: 0.0 standard drinks  . Drug use: No  . Sexual activity: Not Currently  Other Topics Concern  . Not on file  Social History Narrative   Married, 1 daughte, living.  Lives with husband in [place], [place].   1 child died brain tumor at age 53.   Two grandchildren.   Works at [employer], patient currently lost her job.   Retired 2011.   No tobacco.   Alcohol: none in 30  yrs.  Distant history of heavy use.   No drug use.   Social Determinants of Health   Financial Resource Strain:   . Difficulty of Paying Living Expenses:   Food Insecurity:   . Worried About Charity fundraiser in the Last Year:   . Arboriculturist in the Last Year:   Transportation Needs:   . Film/video editor (Medical):   Marland Kitchen Lack of Transportation (Non-Medical):   Physical Activity:   . Days of Exercise per Week:   . Minutes of Exercise per Session:   Stress:   . Feeling of Stress :   Social Connections:   . Frequency of Communication with Friends and Family:   . Frequency of Social Gatherings with Friends and Family:   . Attends Religious Services:   . Active Member of Clubs or Organizations:   . Attends Archivist Meetings:   .  Marital Status:     Review of Systems  Constitutional: Negative for chills, fever, malaise/fatigue and weight loss.  HENT: Negative for congestion and sore throat.   Respiratory: Negative for cough and shortness of breath.   Cardiovascular: Negative for chest pain and palpitations.  Gastrointestinal: Negative for abdominal pain, blood in stool, diarrhea, melena, nausea and vomiting.  Musculoskeletal: Positive for back pain and joint pain. Negative for myalgias.       Right hip area.  Skin: Negative for rash.  Neurological: Negative for dizziness and weakness.  Endo/Heme/Allergies: Does not bruise/bleed easily.  Psychiatric/Behavioral: Negative for depression. The patient is not nervous/anxious.   All other systems reviewed and are negative.    VS: BP 136/85   Pulse 86   Temp (!) 97.3 F (36.3 C) (Temporal)   Ht 5\' 4"  (1.626 m)   Wt 184 lb (83.5 kg) Comment: stated by pt  BMI 31.58 kg/m  Physical Exam Vitals and nursing note reviewed.  Constitutional:      General: She is not in acute distress.    Appearance: She is well-developed. She is obese. She is not ill-appearing, toxic-appearing or diaphoretic.  HENT:     Head:  Normocephalic and atraumatic.  Cardiovascular:     Rate and Rhythm: Normal rate and regular rhythm.     Heart sounds: Normal heart sounds.  Pulmonary:     Effort: Pulmonary effort is normal. No respiratory distress.     Breath sounds: Normal breath sounds.  Abdominal:     General: Bowel sounds are normal.     Palpations: Abdomen is soft. There is no hepatomegaly, splenomegaly or mass.     Tenderness: There is no abdominal tenderness. There is no guarding or rebound.     Hernia: No hernia is present.  Musculoskeletal:        General: No deformity.     Comments: In wheelchair  Skin:    General: Skin is warm and dry.     Coloration: Skin is not jaundiced.     Findings: No rash.  Neurological:     Mental Status: She is alert and oriented to person, place, and time. Mental status is at baseline.     Motor: Tremor present.     Comments: Slurred speech at baseline. Tremors at baseline.  Psychiatric:        Attention and Perception: Attention normal.        Mood and Affect: Mood normal.        Speech: Speech normal.        Behavior: Behavior normal.        Thought Content: Thought content normal.        Cognition and Memory: Cognition and memory normal.       06/27/2019 10:57 AM   Disclaimer: This note was dictated with voice recognition software. Similar sounding words can inadvertently be transcribed and may not be corrected upon review.

## 2019-06-27 NOTE — Patient Instructions (Signed)
Your health issues we discussed today were:   Irritable bowel syndrome mixed type: 1. I have changed her Amitiza to 8 mcg twice a day 2. Change her other constipation medicines (MiraLAX and Colace) to "as needed" only 3. If you are having diarrhea, stop MiraLAX and Colace 4. If you are having more constipation then MiraLAX and Colace can be added back in as needed 5. Call for any worsening or severe symptoms  Dysphagia (swallowing difficulties): 1. There is no need for repeat endoscopy at this time 2. Continue to follow the recommendations made by speech-language pathology after swallow eval 3. Call us if you have any worsening or severe symptoms  Overall I recommend:  1. Continue your other current medications 2. Return for follow-up in 6 months 3. Call us with any questions or concerns.   At [facility] we value your feedback. You may receive a survey about your visit today. Please share your experience as we strive to create trusting relationships with our patients to provide genuine, compassionate, quality care.  We appreciate your understanding and patience as we review any laboratory studies, imaging, and other diagnostic tests that are ordered as we care for you. Our office policy is 5 business days for review of these results, and any emergent or urgent results are addressed in a timely manner for your best interest. If you do not hear from our office in 1 week, please contact us.   We also encourage the use of MyChart, which contains your medical information for your review as well. If you are not enrolled in this feature, an access code is on this after visit summary for your convenience. Thank you for allowing [place] to be involved in your care.  It was great to see you today!  I hope you have a great day!!

## 2019-06-27 NOTE — Telephone Encounter (Signed)
Nothing noted in encounter, will sign off as an error.

## 2019-06-27 NOTE — Assessment & Plan Note (Signed)
Intermittent/rare dysphagia.  EGD less than a year ago with mild [name]'s ring status post dilation.  A few months after her EGD she began having more dysphagia.  Speech-language pathology completed evaluation and recommendations as per HPI.  I have reinforced these recommendations to her.  Call for any worsening or severe symptoms.  Adequate chewing and swallowing precautions.  Other recommendations per speech-language pathology.  Follow-up in 6 months.

## 2019-07-14 ENCOUNTER — Encounter: Payer: Self-pay | Admitting: Cardiovascular Disease

## 2019-07-14 ENCOUNTER — Other Ambulatory Visit: Payer: Self-pay

## 2019-07-14 ENCOUNTER — Ambulatory Visit (INDEPENDENT_AMBULATORY_CARE_PROVIDER_SITE_OTHER): Payer: Medicare Other | Admitting: Cardiovascular Disease

## 2019-07-14 VITALS — BP 132/64 | HR 81 | Ht 64.5 in | Wt 192.0 lb

## 2019-07-14 DIAGNOSIS — Z9889 Other specified postprocedural states: Secondary | ICD-10-CM

## 2019-07-14 DIAGNOSIS — I10 Essential (primary) hypertension: Secondary | ICD-10-CM | POA: Diagnosis not present

## 2019-07-14 DIAGNOSIS — R600 Localized edema: Secondary | ICD-10-CM | POA: Diagnosis not present

## 2019-07-14 DIAGNOSIS — R000 Tachycardia, unspecified: Secondary | ICD-10-CM | POA: Diagnosis not present

## 2019-07-14 DIAGNOSIS — R002 Palpitations: Secondary | ICD-10-CM

## 2019-07-14 NOTE — Patient Instructions (Signed)

## 2019-07-14 NOTE — Progress Notes (Signed)
SUBJECTIVE:  The patient returns for follow-up of tachycardia and leg swelling.  Additional history includes essential tremor with deep brain stimulator placementanddiabetic peripheral neuropathy as well as GERD and esophageal dilatation.  She has not been in the hospital recently.  She denies chest pain and palpitations. She denies orthopnea and paroxysmal nocturnal dyspnea.  She has been bothered by seasonal allergies.  She has chronic leg swelling.  She does not keep her feet elevated.   Soc Hx: Divorced after 10 yrs of marriage. Her ex-husband is also my patient.  Review of Systems: As per "subjective", otherwise negative.  Allergies  Allergen Reactions  . Invokana [Canagliflozin] Other (See Comments)    Yeast infectios  . Losartan Other (See Comments)    Angioedema   . Metformin And Related Diarrhea  . Latex Other (See Comments)    Other Reaction: redness, blisters  . Other Other (See Comments)    Air fresheners - on MAR  . Oxycodone-Acetaminophen Itching and Other (See Comments)    TYLOX. Caused internal itching per patient   . Mucinex [Guaifenesin Er] Other (See Comments)    MUCINEX REACTION: asthma attacks    Current Outpatient Medications  Medication Sig Dispense Refill  . acetaminophen (TYLENOL) 500 MG tablet Take 500 mg by mouth every 4 (four) hours as needed for mild pain, fever or headache.     . albuterol (PROAIR HFA) 108 (90 Base) MCG/ACT inhaler Inhale 2 puffs every 6 hours for asthma - rescue inhaler (Patient taking differently: Inhale 2 puffs into the lungs every 6 (six) hours as needed for shortness of breath. ) 1 Inhaler 12  . albuterol (PROVENTIL) (2.5 MG/3ML) 0.083% nebulizer solution Take 2.5 mg by nebulization every 6 (six) hours as needed for wheezing or shortness of breath.    Marland Kitchen alum & mag hydroxide-simeth (MAALOX/MYLANTA) 200-200-20 MG/5ML suspension Take 30 mLs by mouth as needed for indigestion or heartburn. Max 4 doses per 24 hours    .  aspirin 81 MG chewable tablet Chew 81 mg by mouth daily.     . baclofen (LIORESAL) 10 MG tablet Take 10 mg by mouth at bedtime.    . Carboxymethylcellulose Sod PF (REFRESH PLUS) 0.5 % SOLN Place 1 drop into both eyes 2 (two) times daily.     . Cholecalciferol (VITAMIN D-3) 125 MCG (5000 UT) TABS Take by mouth daily.    Marland Kitchen dextromethorphan (DELSYM) 30 MG/5ML liquid Take by mouth as needed for cough.    . divalproex (DEPAKOTE) 500 MG DR tablet Take 500 mg by mouth 3 (three) times daily.     Marland Kitchen docusate sodium (COLACE) 100 MG capsule Take 100 mg by mouth daily.    Marland Kitchen ENULOSE 10 GM/15ML SOLN Take 10 g by mouth daily as needed.     . fluticasone furoate-vilanterol (BREO ELLIPTA) 100-25 MCG/INH AEPB Inhale 1 puff into the lungs daily. (Patient taking differently: Inhale 1 puff into the lungs daily. RINSE MOUTH WITH WATER AFTER USE) 1 each 11  . gabapentin (NEURONTIN) 300 MG capsule Take 300-600 mg by mouth See admin instructions. Take 300 mg by mouth in the morning and 600 mg at bedtime    . glucose 4 GM chewable tablet Chew 5 tablets by mouth as needed for low blood sugar.    . guaifenesin (ROBITUSSIN) 100 MG/5ML syrup Take 200 mg by mouth as needed for cough.    Marland Kitchen HYDROcodone-acetaminophen (NORCO/VICODIN) 5-325 MG tablet Take 1 tablet by mouth 3 (three) times daily.    Marland Kitchen  hydrocortisone 1 % ointment Apply 1 application topically 2 (two) times daily.    . insulin glargine (LANTUS) 100 UNIT/ML injection Inject 15 Units into the skin at bedtime. *Hold if blood glucose is less than 160*    . ipratropium (ATROVENT) 0.03 % nasal spray 1-2 puffs each nostril 3 times daily, before meals (Patient taking differently: Place 1 spray into both nostrils 3 (three) times daily before meals. ) 30 mL 11  . levothyroxine (SYNTHROID, LEVOTHROID) 75 MCG tablet TAKE (1) TABLET BY MOUTH ONCE DAILY BEFORE BREAKFAST. (Patient taking differently: Take 75 mcg by mouth daily before breakfast. ) 30 tablet 5  . loperamide (IMODIUM A-D)  2 MG tablet Take 2 mg by mouth 4 (four) times daily as needed for diarrhea or loose stools.     . lubiprostone (AMITIZA) 8 MCG capsule Take 1 capsule (8 mcg total) by mouth 2 (two) times daily with a meal. 180 capsule 3  . magnesium hydroxide (MILK OF MAGNESIA) 400 MG/5ML suspension Take 30 mLs by mouth at bedtime as needed for mild constipation.     . Menthol, Topical Analgesic, (BIOFREEZE) 4 % GEL Apply 1 application topically 2 (two) times daily. Apply to lower back and base of right thumb twice daily.    . metoprolol succinate (TOPROL-XL) 50 MG 24 hr tablet Take 50 mg by mouth daily. Take with or immediately following a meal.    . Multiple Vitamin (MULTIVITAMIN) capsule Take 1 capsule by mouth daily.    . Multiple Vitamins-Minerals (MULTIVITAMINS THER. W/MINERALS) TABS tablet TAKE ONE TABLET BY MOUTH ONCE DAILY. (Patient taking differently: Take 1 tablet by mouth daily. ) 30 each 11  . neomycin-bacitracin-polymyxin (NEOSPORIN) ointment Apply 1 application topically as needed for wound care.    Marland Kitchen NOVOLOG FLEXPEN 100 UNIT/ML FlexPen Inject 8 Units into the skin 3 (three) times daily before meals.     . Omega 3 1000 MG CAPS TAKE (1) CAPSULE BY MOUTH ONCE DAILY. (Patient taking differently: Take 1,000 mg by mouth daily. ) 30 capsule 11  . pantoprazole (PROTONIX) 40 MG tablet Take 40 mg by mouth daily.    Marland Kitchen PARoxetine (PAXIL) 10 MG tablet Take 10 mg by mouth daily.     Vladimir Faster Glycol-Propyl Glycol (SYSTANE ULTRA) 0.4-0.3 % SOLN Place 1 drop into both eyes 4 (four) times daily as needed (dry eyes).    . polyethylene glycol (MIRALAX / GLYCOLAX) 17 g packet Take 17 g by mouth daily.    . polyethylene glycol (MIRALAX / GLYCOLAX) packet Take 17 g by mouth daily as needed for moderate constipation. 14 each 0  . potassium chloride SA (K-DUR,KLOR-CON) 20 MEQ tablet Take 20 mEq by mouth daily.    . primidone (MYSOLINE) 50 MG tablet Take 50 mg by mouth daily.     . QUEtiapine (SEROQUEL) 100 MG tablet Take  100 mg by mouth at bedtime.    Marland Kitchen Respiratory Therapy Supplies (FLUTTER) DEVI Use flutter device 3 times a day 1 each 0  . simvastatin (ZOCOR) 5 MG tablet Take 5 mg by mouth at bedtime.     . sitaGLIPtin (JANUVIA) 100 MG tablet Take 100 mg by mouth daily.    . sodium chloride (OCEAN) 0.65 % SOLN nasal spray Take 1-2 sprays in nostril every hour as needed for rhinitis (Patient taking differently: Place 1 spray into both nostrils every hour as needed for congestion. ) 30 mL PRN  . sodium chloride 1 g tablet Take 1 g by mouth 2 (two) times  daily.    . sucralfate (CARAFATE) 1 g tablet Take 1 g by mouth 2 (two) times daily. ON AN EMPTY STOMACH    . zinc gluconate 50 MG tablet Take 50 mg by mouth daily.     No current facility-administered medications for this visit.    Past Medical History:  Diagnosis Date  . Allergic rhinitis    uses Nasonex daily as needed  . Allergy to angiotensin receptor blockers (ARB) 01/22/2014   Angioedema  . Anxiety    takes Ativan daily as needed  . Aspiration pneumonia ([place])   . Asthma    Albuterol inhaler and Neb daily as needed  . 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 [name], residual palpitations  . Bipolar 1 disorder ([place])   . Chronic back pain    reason unknown  . Constipation   . COPD (chronic obstructive pulmonary disease) ([place])   . DDD (degenerative disc disease), lumbar   . Depression    takes Zoloft daily as well as Cymbalta  . Diabetes mellitus    Type II  . Dysphagia   . Dysrhythmia    SVT ==ablation done by Dr. Rayann Heman 2007  . Esophageal reflux    takes Zantac daily  . Frequent headaches   . History of bronchitis Dec 2014  . Hypertension    takes Losartan daily  . Hypothyroidism    takes Synthroid daily  . Joint pain   . Joint swelling    left ankle  . Low sodium syndrome   . Lumbar radiculopathy   . Seizures ([place]) 04-05-13   one time ran out of Primidone and had stopped it cold  [place]  . SVT (supraventricular tachycardia) ([place])   . Tremor, essential    takes Mysoline and Neurontin daily.  . Vocal cord dysfunction   . Weakness    in left arm    Past Surgical History:  Procedure Laterality Date  . ABDOMINAL HYSTERECTOMY    . ABLATION FOR AVNRT    . APPENDECTOMY    . BARTHOLIN GLAND CYST EXCISION     x 2  . BIOPSY  03/04/2015   Procedure: BIOPSY (Duodenal, Gastric);  Surgeon: Daneil Dolin, MD;  Location: [facility];  Service: Endoscopy;;  . BIOPSY  12/19/2018   Procedure: BIOPSY;  Surgeon: Daneil Dolin, MD;  [facility];  Service: Endoscopy;;  Gastric Biopsies    . BREAST SURGERY    . CESAREAN SECTION    . CHOLECYSTECTOMY    . COLONOSCOPY  01/16/2004   [id number] rectum/colon  . COLONOSCOPY WITH PROPOFOL N/A 03/04/2015   Dr.[name]- internal hemorrhoids, pancolonic diverticulosis, colonic polyp= tubular adenoma  . DEEP BRAIN STIMULATOR PLACEMENT    . DENTAL SURGERY    . ESOPHAGEAL DILATION N/A 03/04/2015   Procedure: ESOPHAGEAL DILATION WITH 54FR MALONEY DILATOR;  Surgeon: Daneil Dolin, MD;  Location: [facility];  Service: Endoscopy;  Laterality: N/A;  . ESOPHAGEAL DILATION  12/19/2018   Procedure: ESOPHAGEAL DILATION;  Surgeon: Daneil Dolin, MD;  [facility];  Service: Endoscopy;;  . ESOPHAGOGASTRODUODENOSCOPY (EGD) WITH ESOPHAGEAL DILATION  04/03/2002   [id number] ring, otherwise normal esophagus, status post dilation with 56 F/Normal stomach  . ESOPHAGOGASTRODUODENOSCOPY (EGD) WITH ESOPHAGEAL DILATION N/A 11/08/2012   Dr. [name]:schatzki's ring s/p dilation/hiatal hernia  . ESOPHAGOGASTRODUODENOSCOPY (EGD) WITH PROPOFOL N/A 03/04/2015   Dr.[name]- schatzki's ring, hiatal hernia, scattered erosions bx= chronic inactive gastritis  . ESOPHAGOGASTRODUODENOSCOPY (EGD)  WITH PROPOFOL N/A 05/06/2017   Normal esophagus. Dilated. Small hiatal hernia. Normal duodenal bulb and second portion of duodenum.  . ESOPHAGOGASTRODUODENOSCOPY  (EGD) WITH PROPOFOL N/A 12/19/2018   Procedure: ESOPHAGOGASTRODUODENOSCOPY (EGD) WITH PROPOFOL;  Surgeon: Daneil Dolin, MD;  [facility];  Service: Endoscopy;  Laterality: N/A;  11:15am  . FINGER SURGERY    . HEMORRHOID BANDING  2017   Dr.[name]  . LEFT ANKLE LIGAMENT REPAIR     x 2  . LEFT ELBOW REPAIR    . MALONEY DILATION N/A 05/06/2017   Procedure: Venia Minks DILATION;  Surgeon: Daneil Dolin, MD;  [facility];  Service: Endoscopy;  Laterality: N/A;  . Venia Minks DILATION N/A 12/19/2018   Procedure: Venia Minks DILATION;  Surgeon: Daneil Dolin, MD;  [facility];  Service: Endoscopy;  Laterality: N/A;  . MAXIMUM ACCESS (MAS)POSTERIOR LUMBAR INTERBODY FUSION (PLIF) 1 LEVEL N/A 04/24/2014   Procedure: Lumbar four-five Maximum access Surgery posterior lumbar interbody fusion;  Surgeon: [name], MD;  Location: [place];  Service: Neurosurgery;  Laterality: N/A;  Lumbar four-five Maximum access Surgery posterior lumbar interbody fusion  . MULTIPLE EXTRACTIONS WITH ALVEOLOPLASTY N/A 06/18/2017   Procedure: MULTIPLE EXTRACTION;  Surgeon: Diona Browner, DDS;  Location: [facility];  Service: Oral Surgery;  Laterality: N/A;  . PARTIAL HYSTERECTOMY    . POLYPECTOMY  03/04/2015   Procedure: POLYPECTOMY (descending colon);  Surgeon: Daneil Dolin, MD;  Location: [facility];  Service: Endoscopy;;  . PULSE GENERATOR IMPLANT Bilateral 12/15/2013   Procedure: BILATERAL PULSE GENERATOR IMPLANT;  Surgeon: [name], MD;  Location: [place] [facility];  Service: Neurosurgery;  Laterality: Bilateral;  BILATERAL  . RIGHT BREAST CYST     benign  . SUBTHALAMIC STIMULATOR BATTERY REPLACEMENT Bilateral 06/24/2018   Procedure: Bilateral implantable pulse generator battery change;  Surgeon: [name], MD;  Location: [place] [facility];  Service: Neurosurgery;  Laterality: Bilateral;  bilateral  . SUBTHALAMIC STIMULATOR INSERTION Bilateral 12/04/2013   Procedure: Bilateral Deep brain stimulator  placement;  Surgeon: [name], MD;  Location: [facility];  Service: Neurosurgery;  Laterality: Bilateral;  Bilateral Deep brain stimulator placement  . TONSILLECTOMY      Social History   Socioeconomic History  . Marital status: Legally Separated    Spouse name: Not on file  . Number of children: Not on file  . Years of education: Not on file  . Highest education level: Bachelor's degree (e.g., BA, AB, BS)  Occupational History  . Occupation: retired  Tobacco Use  . Smoking status: Former Smoker    Packs/day: 0.30    Years: 10.00    Pack years: 3.00    Types: Cigarettes    Quit date: 04/23/1993    Years since quitting: 26.2  . Smokeless tobacco: Never Used  . Tobacco comment: quit smoking 25+yrs ago  Substance and Sexual Activity  . Alcohol use: No    Alcohol/week: 0.0 standard drinks  . Drug use: No  . Sexual activity: Not Currently  Other Topics Concern  . Not on file  Social History Narrative   Married, 1 daughte, living.  Lives with husband in [place], [place].   1 child died brain tumor at age 71.   Two grandchildren.   Works at [employer], patient currently lost her job.   Retired 2011.   No tobacco.   Alcohol: none in 30 yrs.  Distant history of heavy use.   No drug use.   Social Determinants of Health  Financial Resource Strain:   . Difficulty of Paying Living Expenses:   Food Insecurity:   . Worried About Charity fundraiser in the Last Year:   . Arboriculturist in the Last Year:   Transportation Needs:   . Film/video editor (Medical):   Marland Kitchen Lack of Transportation (Non-Medical):   Physical Activity:   . Days of Exercise per Week:   . Minutes of Exercise per Session:   Stress:   . Feeling of Stress :   Social Connections:   . Frequency of Communication with Friends and Family:   . Frequency of Social Gatherings with Friends and Family:   . Attends Religious Services:   . Active Member of Clubs or Organizations:   . Attends Theatre manager Meetings:   Marland Kitchen Marital Status:   Intimate Partner Violence:   . Fear of Current or Ex-Partner:   . Emotionally Abused:   Marland Kitchen Physically Abused:   . Sexually Abused:    Orson Slick, LPN was present throughout the entirety of the encounter.  Vitals:   07/14/19 0934  BP: 132/64  Pulse: 81  SpO2: 96%  Weight: 192 lb (87.1 kg)  Height: 5' 4.5" (1.638 m)    Wt Readings from Last 3 Encounters:  07/14/19 192 lb (87.1 kg)  06/27/19 184 lb (83.5 kg)  05/10/19 191 lb 3.2 oz (86.7 kg)     PHYSICAL EXAM General: NAD HEENT: Mild head tremor noted. Neck: No JVD, no thyromegaly. Lungs: Clear to auscultation bilaterally. CV: Regular rate and rhythm, normal S1/S2, no S3/S4, no murmur.  Trace, chronic bilateral lower extremity edema.   Abdomen: Soft, nontender, no distention.  Neurologic: Alert and oriented.  Psych: Normal affect. Skin: Normal. Musculoskeletal: No gross deformities.    ECG: Reviewed above under Subjective   Labs: Lab Results  Component Value Date/Time   K 3.4 (L) 12/14/2018 11:08 AM   BUN 8 12/14/2018 11:08 AM   CREATININE 0.65 12/14/2018 11:08 AM   CREATININE 0.70 12/27/2012 10:06 AM   ALT 22 09/30/2017 11:25 PM   TSH 1.01 05/21/2015 11:57 AM   HGB 12.8 04/20/2018 11:15 AM     Lipids: Lab Results  Component Value Date/Time   LDLCALC 80 02/13/2015 02:55 PM   CHOL 180 02/13/2015 02:55 PM   TRIG 175.0 (H) 02/13/2015 02:55 PM   HDL 64.80 02/13/2015 02:55 PM       ASSESSMENT AND PLAN:  1.  Tachycardia and palpitations: S/p AVNRT ablation in 2009 with residual sinus tachycardia. Symptomatically stable without recurrences. Continue metoprolol succinate 50 mg daily for tachycardia.  2. Essential HTN: Blood pressure is normal. No changes.  3.  Bilateral leg edema: This is chronic and due to venous insufficiency. We previously talked about the possible use of compression stockings but she deferred.  I encouraged her to keep her legs elevated.     Disposition: Follow up 1 yr   Kate Sable, M.D., F.A.C.C.

## 2019-08-02 ENCOUNTER — Ambulatory Visit (INDEPENDENT_AMBULATORY_CARE_PROVIDER_SITE_OTHER): Payer: Medicare Other | Admitting: Internal Medicine

## 2019-08-02 ENCOUNTER — Encounter: Payer: Self-pay | Admitting: Internal Medicine

## 2019-08-02 ENCOUNTER — Other Ambulatory Visit: Payer: Self-pay

## 2019-08-02 ENCOUNTER — Ambulatory Visit (INDEPENDENT_AMBULATORY_CARE_PROVIDER_SITE_OTHER): Payer: Medicare Other

## 2019-08-02 VITALS — BP 124/78 | HR 79 | Temp 98.0°F | Ht 64.5 in | Wt 193.0 lb

## 2019-08-02 DIAGNOSIS — K219 Gastro-esophageal reflux disease without esophagitis: Secondary | ICD-10-CM | POA: Diagnosis not present

## 2019-08-02 DIAGNOSIS — F332 Major depressive disorder, recurrent severe without psychotic features: Secondary | ICD-10-CM | POA: Diagnosis not present

## 2019-08-02 DIAGNOSIS — J4540 Moderate persistent asthma, uncomplicated: Secondary | ICD-10-CM

## 2019-08-02 DIAGNOSIS — J4541 Moderate persistent asthma with (acute) exacerbation: Secondary | ICD-10-CM

## 2019-08-02 DIAGNOSIS — J209 Acute bronchitis, unspecified: Secondary | ICD-10-CM

## 2019-08-02 NOTE — Assessment & Plan Note (Signed)
She has had active GI and Speech Therapy assessment. Plan- continue reflux precautions

## 2019-08-02 NOTE — Progress Notes (Signed)
Patient ID: [name], female    DOB: 09/10/1950, 69 y.o.   MRN: [id number]  HPI  female former smoker followed for Allergic rhinitis, Asthma, complicated by anxiety/VCD, CAD, GERD, DM2, tachycardia, tremor/deep brain stimulator, HBP, now living in assisted living Allergy Profile 01/10/2013-total IgE 166 with significant elevations for most common allergens except molds She had been on allergy vaccine years ago. Office Spirometry 12/13/2014-mild obstruction, mild restriction of forced vital capacity Upper endoscopy 2016-[name]'s ring and gastric erosions ----------------------------------------------------------   02/01/2019- Virtual Visit via Telephone Note  Location: Patient: [facility] Provider: O   History of Present Illness: 69 year old female former smoker followed for Allergic rhinitis, Asthma, complicated by anxiety/VCD, CAD, GERD, DM, tachycardia, tremor/deep brain stimulator, HBP, now living in assisted living/ [facility] upper endoscopy-normal with dilatation done -----pt states her breathing has worsened since LOV, increased shortness of breath, wheezing - more frequently having to take breathing treatments Televisit in July w NP gave augmentin w concern for aspiration. Was working w GI on choking and constipation.  had flu vax Says augmentin helped this summer. Denies fever now. Admits some choking with eating at times. Wheeze helped by her neb treatments. She asks that we order scheduled Delsym so she doesn't have to wait for it. Discussed.  Observations/Objective: Slow speech with no evident distress over phone. Not coughing.   Assessment and Plan: Asthma complicated by LPR- Plan continue nebs. Ok to try scheduled Delsym as she requests. Neurologic disorder w tremor- High risk for LPR/ aspiration- continue working with GI and Neurology. Follow Up Instructions: 6 months   Baird Lyons, MD  08/02/19- 69 year old female former  smoker followed for Allergic rhinitis, Asthma, complicated by anxiety/VCD, CAD, GERD/ LPR, DM, tachycardia, tremor/deep brain stimulator, HBP, now living in assisted living/ [facility] -----f/u Asthma moderate persistent. Swallowing eval in Dec showed airway penetration. Rec thin liquids, vertical. ProAir hfa, Breo 100, neb albuterol,  Had 2 Covax- not sure which. Outside CXR reported clear 03/01/2019. She says after that she was hosp in [place] for Covid pneumonia sometime last winter. Mostly back to baseline. Used neb and rescue inhaler yesterday. Denies fever, discolored sputum. Trying to be careful swallowing.   ROS-see HPI    "+" = positive Constitutional:    weight loss, night sweats, fevers, chills, fatigue, lassitude. HEENT:    headaches, difficulty swallowing, tooth/dental problems, sore throat,       sneezing, itching, ear ache, nasal congestion, +post nasal drip, snoring CV:    chest pain, orthopnea, PND, swelling in lower extremities, anasarca,                                                 dizziness, palpitations Resp:   +shortness of breath with exertion or at rest.                productive cough,    non-productive cough, coughing up of blood.              change in color of mucus.  +wheezing.   Skin:    rash or lesions. GI:  + heartburn, indigestion, abdominal pain, nausea, vomiting, GU:  MS:   joint pain, stiffness, decreased range of motion, back pain. Neuro-    + chronic tremor, +unsteady/ walker Psych:  +change in mood or affect.  +depression or anxiety.  memory loss.  OBJ- Physical Exam General- Alert, Oriented, Affect-appropriate, Distress- none acute, talkative,+obese,           +wheelchair Skin- rash-none, lesions- none, excoriation- none Lymphadenopathy- none Head- atraumatic            Eyes- Gross vision intact, PERRLA, conjunctivae and secretions clear            Ears- Hearing, canals-normal            Nose- Clear, no-Septal dev, mucus, polyps, erosion,  perforation             Throat- Mallampati II , mucosa -tongue slightly coated , drainage- none, tonsils-                  atrophic, + missing teeth    Neck- flexible , trachea midline, no stridor , thyroid nl, carotid no bruit Chest - symmetrical excursion , unlabored           Heart/CV- RRR , no murmur , no gallop  , no rub, nl s1 s2                           - JVD- none , edema- none, stasis changes- none, varices- none           Lung-  unlabored, wheeze -none, cough-none , dullness-none, rub- none,            Chest wall-+ pacemaker  (deep brain stimulator) left Abd-  Br/ Gen/ Rectal- Not done, not indicated Extrem- cyanosis- none, clubbing, none, atrophy- none, strength- nl,  Neuro- + tremor and head bob,+ mild dysphonia, + unsteady

## 2019-08-02 NOTE — Patient Instructions (Signed)
We can continue current meds  Please be careful to follow the guidelines to prevent reflux and aspiration.  Order- CXR   Dx Asthma moderate persistent uncomplicated  Please call if we can help

## 2019-08-02 NOTE — Assessment & Plan Note (Signed)
Now clear. Baseline includes use of rescue inhaler, nebulizer most days, with on-going Breo. Major trigger aspiration, with significant anxiety component.  Given her report of Covid pneumonia during the winter, we will update CXR. Plan- CXR, continue current meds

## 2019-08-02 NOTE — Assessment & Plan Note (Signed)
Wheelchair for stability, but tremor is less overt at this visit. Plan- continue with Neurology

## 2019-08-04 ENCOUNTER — Telehealth: Payer: Self-pay

## 2019-08-04 MED ORDER — AZITHROMYCIN 250 MG PO TABS: 250.0000 mg | ORAL_TABLET | Freq: Every day | ORAL | 0 refills | Status: DC

## 2019-08-04 NOTE — Telephone Encounter (Signed)
Offer Zpak 250 mg, # 6   2 today then one daily

## 2019-08-04 NOTE — Telephone Encounter (Signed)
Spoke with patient regarding her CXR result's. Patient stated she has been very congested today with coughing up yellow flem . Patient stated she this been going on for awhile and forgot to mention at her last office visit . Dr. Annamaria Boots can you please advise.   Allergies Invokana [Canagliflozin] Other (See Comments) High 12/05/2014  Yeast infectios  Losartan Other (See Comments) High 01/22/2014  Angioedema   Latex Other (See Comments) Not Specified 07/02/2014  Other Reaction: redness, blisters  Other Other (See Comments) Not Specified 07/02/2014  Air fresheners - on [facility]  Oxycodone-acetaminophen Itching, Other (See Comments) Not Specified Allergy 05/11/2011  TYLOX. Caused internal itching per patient   Mucinex [Guaifenesin Er] Other (See Comments) Low Allergy  MUCINEX REACTION: asthma attacks  Adverse Reactions/Drug Intolerances Metformin And Related Diarrhea Medium Intolerance 01/18/2013  acetaminophen (TYLENOL) 500 MG tablet  Take 500 mg by mouth every 4 (four) hours as needed for mild pain, fever or headache. Informant: Nursing Home Medication Administration Guide (MAG), Last Dose: Not Recorded  TakingNot TakingUnknown  albuterol (PROAIR HFA) 108 (90 Base) MCG/ACT inhaler  Inhale 2 puffs every 6 hours for asthma - rescue inhaler, Print  Patient taking differently: 2 puff Inhalation Every 6 hours PRN, shortness of breath, (No instructions reported), Reason: Free Text Sig Edit, Informant: Nursing Home Medication Administration Guide (MAG), Reported on 06/17/2018, Last Dose: Not Recorded  Refills:12 ordered Pharmacy:[facility], [place] [employer].  TakingNot TakingUnknown  albuterol (PROVENTIL) (2.5 MG/3ML) 0.083% nebulizer solution  Take 2.5 mg by nebulization every 6 (six) hours as needed for wheezing or shortness of breath. Informant: Nursing Home Medication Administration Guide (MAG), Last Dose: Not Recorded  TakingNot TakingUnknown  alum & mag hydroxide-simeth  (MAALOX/MYLANTA) 200-200-20 MG/5ML suspension  Take 30 mLs by mouth as needed for indigestion or heartburn. Max 4 doses per 24 hours, Informant: Nursing Home Medication Administration Guide (MAG), Last Dose: Not Recorded  TakingNot TakingUnknown  aspirin 81 MG chewable tablet  Chew 81 mg by mouth daily. Informant: Nursing Home Medication Administration Guide (MAG), Last Dose: Not Recorded  TakingNot TakingUnknown  baclofen (LIORESAL) 10 MG tablet  Take 10 mg by mouth at bedtime. Informant: Nursing Home Medication Administration Guide (MAG), Last Dose: Not Recorded  TakingNot TakingUnknown  Carboxymethylcellulose Sod PF (REFRESH PLUS) 0.5 % SOLN  Place 1 drop into both eyes 2 (two) times daily. Informant: Nursing Home Medication Administration Guide (MAG), Last Dose: Not Recorded  TakingNot TakingUnknown  Cholecalciferol (VITAMIN D-3) 125 MCG (5000 UT) TABS  Take by mouth daily. Last Dose: Not Recorded  TakingNot TakingUnknown  divalproex (DEPAKOTE) 500 MG DR tablet  Take 500 mg by mouth 3 (three) times daily. Informant: Nursing Home Medication Administration Guide (MAG), Last Dose: Not Recorded  TakingNot TakingUnknown  docusate sodium (COLACE) 100 MG capsule  Take 100 mg by mouth daily. Last Dose: Not Recorded  TakingNot TakingUnknown  ENULOSE 10 GM/15ML SOLN  Take 10 g by mouth daily as needed. Informant: Nursing Home Medication Administration Guide (MAG), Last Dose: Not Recorded  Received from: External Pharmacy  TakingNot TakingUnknown  fluticasone furoate-vilanterol (BREO ELLIPTA) 100-25 MCG/INH AEPB  Inhale 1 puff into the lungs daily., Starting Thu 12/17/2016, Normal  Patient taking differently: 1 puff Inhalation Daily, RINSE MOUTH WITH WATER AFTER USE, Reason: Free Text Sig Edit, Informant: Nursing Home Medication Administration Guide (MAG), Reported on 12/09/2018, Last Dose: Not Recorded  Refills:11 ordered Pharmacy:[facility] [place], [name].   TakingNot TakingUnknown  gabapentin (NEURONTIN) 300 MG capsule  Take 300-600 mg by mouth See admin instructions. Take 300 mg by mouth in the morning and 600 mg at bedtime, Informant: Nursing Home Medication Administration Guide (MAG), Last Dose: Not Recorded  TakingNot TakingUnknown  glucose 4 GM chewable tablet  Chew 5 tablets by mouth as needed for low blood sugar. Informant: Nursing Home Medication Administration Guide (MAG), Last Dose: Not Recorded  TakingNot TakingUnknown  guaifenesin (ROBITUSSIN) 100 MG/5ML syrup  Take 200 mg by mouth as needed for cough. Last Dose: Not Recorded  TakingNot TakingUnknown  HYDROcodone-acetaminophen (NORCO/VICODIN) 5-325 MG tablet  Take 1 tablet by mouth 3 (three) times daily. Informant: Nursing Home Medication Administration Guide (MAG), Last Dose: Not Recorded  TakingNot TakingUnknown  hydrocortisone 1 % ointment  Apply 1 application topically 2 (two) times daily. Last Dose: Not Recorded  TakingNot TakingUnknown  insulin glargine (LANTUS) 100 UNIT/ML injection  Inject 15 Units into the skin at bedtime. *Hold if blood glucose is less than 160* Informant: Nursing Home Medication Administration Guide (MAG), Last Dose: Not Recorded  TakingNot TakingUnknown  ipratropium (ATROVENT) 0.03 % nasal spray  1-2 puffs each nostril 3 times daily, before meals, Print  Patient taking differently: 1 spray Each Nare 3 times daily before meals, (No instructions reported), Reason: Added Indication / FREQ , Informant: Nursing Home Medication Administration Guide (MAG), Reported on 12/09/2018, Last Dose: Not Recorded  Refills:11 ordered Pharmacy:[facility], [place] TakingUnknown  levothyroxine (SYNTHROID, LEVOTHROID) 75 MCG tablet  TAKE (1) TABLET BY MOUTH ONCE DAILY BEFORE BREAKFAST., Normal  Patient taking differently: 75 mcg Oral Daily before breakfast, Reason: Free Text Sig Edit, Informant: Nursing Home Medication  Administration Guide (MAG), Reported on 06/17/2018, Last Dose: Not Recorded  Refills:5 ordered Pharmacy:[facility] [place], [place] - [address]  TakingNot TakingUnknown  loperamide (IMODIUM A-D) 2 MG tablet  Take 2 mg by mouth 4 (four) times daily as needed for diarrhea or loose stools. Informant: Nursing Home Medication Administration Guide (MAG), Last Dose: Not Recorded  Received from: [place] TakingUnknown  lubiprostone (AMITIZA) 8 MCG capsule  Take 1 capsule (8 mcg total) by mouth 2 (two) times daily with a meal., Starting Tue 06/27/2019, Normal Last Dose: Not Recorded  Refills:3 ordered Pharmacy:[facility] [place], [place].  TakingNot TakingUnknown  magnesium hydroxide (MILK OF MAGNESIA) 400 MG/5ML suspension  Take 30 mLs by mouth at bedtime as needed for mild constipation. Informant: Nursing Home Medication Administration Guide (MAG), Last Dose: Not Recorded  TakingNot TakingUnknown  Menthol, Topical Analgesic, (BIOFREEZE) 4 % GEL  Apply 1 application topically 2 (two) times daily. Apply to lower back and base of right thumb twice daily. Informant: Nursing Home Medication Administration Guide (MAG), Last Dose: Not Recorded  TakingNot TakingUnknown  metoprolol succinate (TOPROL-XL) 50 MG 24 hr tablet  Take 50 mg by mouth daily. Take with or immediately following a meal. Informant: Nursing Home Medication Administration Guide (MAG), Last Dose: Not Recorded  TakingNot TakingUnknown  Multiple Vitamin (MULTIVITAMIN) capsule  Take 1 capsule by mouth daily. Last Dose: Not Recorded  TakingNot TakingUnknown  Multiple Vitamins-Minerals (MULTIVITAMINS THER. W/MINERALS) TABS tablet  TAKE ONE TABLET BY MOUTH ONCE DAILY., Normal  Patient taking differently: 1 tablet Oral Daily, Reason: Free Text Sig Edit, Informant: Nursing Home Medication Administration Guide (MAG), Reported on 06/17/2018, Last Dose: Not Recorded  Refills:11 ordered  Pharmacy:[facility] [place], [place]  TakingUnknown  neomycin-bacitracin-polymyxin (NEOSPORIN) ointment  Apply 1 application topically as needed for wound care. Informant: Nursing Home Medication Administration Guide (MAG), Last Dose: Not Recorded  TakingNot TakingUnknown  NOVOLOG FLEXPEN 100 UNIT/ML FlexPen  Inject 8 Units into the skin 3 (three) times daily before meals. Informant: Nursing Home Medication Administration Guide (MAG), Last Dose: Not Recorded  TakingNot TakingUnknown  Omega 3 1000 MG CAPS  TAKE (1) CAPSULE BY MOUTH ONCE DAILY., Normal  Patient taking differently: 1,000 mg Oral Daily, Reason: Free Text Sig Edit, Informant: Nursing Home Medication Administration Guide (MAG), Reported on 06/17/2018, Last Dose: Not Recorded  Refills:11 ordered Pharmacy:[facility] - [place], [place] - [address]  TakingNot TakingUnknown  pantoprazole (PROTONIX) 40 MG tablet  Take 40 mg by mouth daily. Informant: Nursing Home Medication Administration Guide (MAG), Last Dose: Not Recorded  TakingNot TakingUnknown  PARoxetine (PAXIL) 10 MG tablet  Take 10 mg by mouth daily. Informant: Nursing Home Medication Administration Guide (MAG), Last Dose: Not Recorded  Received from: External Pharmacy  TakingNot TakingUnknown  Polyethyl Glycol-Propyl Glycol (SYSTANE ULTRA) 0.4-0.3 % SOLN  Place 1 drop into both eyes 4 (four) times daily as needed (dry eyes). Informant: Nursing Home Medication Administration Guide (MAG), Last Dose: Not Recorded  TakingNot TakingUnknown  polyethylene glycol (MIRALAX / GLYCOLAX) 17 g packet  Take 17 g by mouth daily. Informant: Nursing Home Medication Administration Guide (MAG), Last Dose: Not Recorded  TakingNot TakingUnknown  polyethylene glycol (MIRALAX / GLYCOLAX) packet  Take 17 g by mouth daily as needed for moderate constipation., Starting Fri 10/01/2017, Print, Last Dose: Not Recorded  Refills:0 ordered Pharmacy:[facility] [place] TakingUnknown  potassium chloride SA (K-DUR,KLOR-CON) 20 MEQ tablet  Take 20 mEq by mouth daily. Informant: Nursing Home Medication Administration Guide (MAG), Last Dose: Not Recorded  TakingNot TakingUnknown  primidone (MYSOLINE) 50 MG tablet  Take 50 mg by mouth daily. Informant: Nursing Home Medication Administration Guide (MAG), Last Dose: Not Recorded  TakingNot TakingUnknown  QUEtiapine (SEROQUEL) 100 MG tablet  Take 100 mg by mouth at bedtime. Informant: Nursing Home Medication Administration Guide (MAG), Last Dose: Not Recorded  TakingNot TakingUnknown  Respiratory Therapy Supplies (FLUTTER) DEVI  Use flutter device 3 times a day, Print, Last Dose: Not Recorded  Refills:0 ordered Pharmacy:[facility] [place], [place] [employer].  TakingNot TakingUnknown  simvastatin (ZOCOR) 5 MG tablet  Take 5 mg by mouth at bedtime. Informant: Nursing Home Medication Administration Guide (MAG), Last Dose: Not Recorded  Received from: External Pharmacy  TakingNot TakingUnknown  sitaGLIPtin (JANUVIA) 100 MG tablet  Take 100 mg by mouth daily. Informant: Nursing Home Medication Administration Guide (MAG), Last Dose: Not Recorded  TakingNot TakingUnknown  sodium chloride (OCEAN) 0.65 % SOLN nasal spray  Take 1-2 sprays in nostril every hour as needed for rhinitis, Normal  Patient taking differently: 1 spray Each Nare Every 1 hour PRN, congestion, (No instructions reported), Reason: Added Indication / FREQ , Informant: Nursing Home Medication Administration Guide (MAG), Reported on 06/17/2018, Last Dose: Not Recorded  Refills:Ordered: PRN Pharmacy:[facility] [place], [place].  TakingNot TakingUnknown  sodium chloride 1 g tablet  Take 1 g by mouth 2 (two) times daily. Informant: Nursing Home Medication Administration Guide (MAG), Last Dose: Not Recorded  TakingNot TakingUnknown  sucralfate (CARAFATE)  1 g tablet  Take 1 g by mouth 2 (two) times daily. ON AN EMPTY STOMACH, Informant: Nursing Home Medication  Administration Guide (MAG), Last Dose: Not Recorded  Received from: External Pharmacy  TakingNot TakingUnknown  zinc gluconate 50 MG tablet  Take 50 mg by mouth daily. Last Dose: Not Recorded

## 2019-08-04 NOTE — Addendum Note (Signed)
Addended by: Lia Foyer R on: 08/04/2019 04:28 PM   Modules accepted: Orders

## 2019-08-04 NOTE — Telephone Encounter (Signed)
Called and spoke with patient she verified pharmacy and RX has been sent in. Nothing further needed at this time.

## 2019-08-04 NOTE — Telephone Encounter (Signed)
Left message for patient to call back  

## 2019-08-25 ENCOUNTER — Telehealth: Payer: Self-pay | Admitting: *Deleted

## 2019-08-25 ENCOUNTER — Encounter: Payer: Medicare Other | Admitting: Adult Health

## 2019-08-25 ENCOUNTER — Other Ambulatory Visit: Payer: Self-pay

## 2019-08-27 ENCOUNTER — Other Ambulatory Visit: Payer: Self-pay

## 2019-08-27 ENCOUNTER — Emergency Department (HOSPITAL_COMMUNITY): Payer: Medicare Other

## 2019-08-27 ENCOUNTER — Emergency Department (HOSPITAL_COMMUNITY)
Admission: EM | Admit: 2019-08-27 | Discharge: 2019-08-27 | Disposition: A | Discharge status: Home / Self Care | Payer: Medicare Other | Attending: Emergency Medicine | Admitting: Emergency Medicine

## 2019-08-27 ENCOUNTER — Encounter (HOSPITAL_COMMUNITY): Payer: Self-pay | Admitting: Emergency Medicine

## 2019-08-27 DIAGNOSIS — Z79899 Other long term (current) drug therapy: Secondary | ICD-10-CM | POA: Diagnosis not present

## 2019-08-27 DIAGNOSIS — Z7982 Long term (current) use of aspirin: Secondary | ICD-10-CM | POA: Insufficient documentation

## 2019-08-27 DIAGNOSIS — E119 Type 2 diabetes mellitus without complications: Secondary | ICD-10-CM | POA: Insufficient documentation

## 2019-08-27 DIAGNOSIS — Z87891 Personal history of nicotine dependence: Secondary | ICD-10-CM | POA: Insufficient documentation

## 2019-08-27 DIAGNOSIS — Z794 Long term (current) use of insulin: Secondary | ICD-10-CM | POA: Insufficient documentation

## 2019-08-27 DIAGNOSIS — Z91040 Latex allergy status: Secondary | ICD-10-CM | POA: Diagnosis not present

## 2019-08-27 DIAGNOSIS — Z20822 Contact with and (suspected) exposure to covid-19: Secondary | ICD-10-CM | POA: Diagnosis not present

## 2019-08-27 DIAGNOSIS — I10 Essential (primary) hypertension: Secondary | ICD-10-CM | POA: Diagnosis not present

## 2019-08-27 DIAGNOSIS — E039 Hypothyroidism, unspecified: Secondary | ICD-10-CM | POA: Diagnosis not present

## 2019-08-27 DIAGNOSIS — R0602 Shortness of breath: Secondary | ICD-10-CM | POA: Diagnosis present

## 2019-08-27 DIAGNOSIS — J441 Chronic obstructive pulmonary disease with (acute) exacerbation: Secondary | ICD-10-CM | POA: Diagnosis not present

## 2019-08-27 LAB — CBC WITH DIFFERENTIAL/PLATELET
Abs Immature Granulocytes: 0.02 10*3/uL (ref 0.00–0.07)
Basophils Absolute: 0 10*3/uL (ref 0.0–0.1)
Basophils Relative: 0 %
Eosinophils Absolute: 0.2 10*3/uL (ref 0.0–0.5)
Eosinophils Relative: 3 %
HCT: 37.7 % (ref 36.0–46.0)
Hemoglobin: 12.7 g/dL (ref 12.0–15.0)
Immature Granulocytes: 0 %
Lymphocytes Relative: 38 %
Lymphs Abs: 1.9 10*3/uL (ref 0.7–4.0)
MCH: 34.3 pg — ABNORMAL HIGH (ref 26.0–34.0)
MCHC: 33.7 g/dL (ref 30.0–36.0)
MCV: 101.9 fL — ABNORMAL HIGH (ref 80.0–100.0)
Monocytes Absolute: 0.3 10*3/uL (ref 0.1–1.0)
Monocytes Relative: 7 %
Neutro Abs: 2.5 10*3/uL (ref 1.7–7.7)
Neutrophils Relative %: 52 %
Platelets: 105 10*3/uL — ABNORMAL LOW (ref 150–400)
RBC: 3.7 MIL/uL — ABNORMAL LOW (ref 3.87–5.11)
RDW: 13.2 % (ref 11.5–15.5)
WBC: 4.9 10*3/uL (ref 4.0–10.5)
nRBC: 0 % (ref 0.0–0.2)

## 2019-08-27 LAB — URINALYSIS, ROUTINE W REFLEX MICROSCOPIC
Bilirubin Urine: NEGATIVE
Glucose, UA: NEGATIVE mg/dL
Hgb urine dipstick: NEGATIVE
Ketones, ur: NEGATIVE mg/dL
Leukocytes,Ua: NEGATIVE
Nitrite: NEGATIVE
Protein, ur: NEGATIVE mg/dL
Specific Gravity, Urine: 1.01 (ref 1.005–1.030)
pH: 8 (ref 5.0–8.0)

## 2019-08-27 LAB — COMPREHENSIVE METABOLIC PANEL
ALT: 18 U/L (ref 0–44)
AST: 20 U/L (ref 15–41)
Albumin: 3.7 g/dL (ref 3.5–5.0)
Alkaline Phosphatase: 53 U/L (ref 38–126)
Anion gap: 8 (ref 5–15)
BUN: 9 mg/dL (ref 8–23)
CO2: 30 mmol/L (ref 22–32)
Calcium: 9.1 mg/dL (ref 8.9–10.3)
Chloride: 96 mmol/L — ABNORMAL LOW (ref 98–111)
Creatinine, Ser: 0.62 mg/dL (ref 0.44–1.00)
GFR calc Af Amer: >60 mL/min (ref >60)
GFR calc non Af Amer: >60 mL/min (ref >60)
Glucose, Bld: 166 mg/dL — ABNORMAL HIGH (ref 70–99)
Potassium: 4.7 mmol/L (ref 3.5–5.1)
Sodium: 134 mmol/L — ABNORMAL LOW (ref 135–145)
Total Bilirubin: 0.7 mg/dL (ref 0.3–1.2)
Total Protein: 6.7 g/dL (ref 6.5–8.1)

## 2019-08-27 LAB — BRAIN NATRIURETIC PEPTIDE: B Natriuretic Peptide: 81 pg/mL (ref 0.0–100.0)

## 2019-08-27 LAB — SARS CORONAVIRUS 2 BY RT PCR (HOSPITAL ORDER, PERFORMED IN ~~LOC~~ HOSPITAL LAB): SARS Coronavirus 2: NEGATIVE

## 2019-08-27 MED ORDER — ALBUTEROL SULFATE HFA 108 (90 BASE) MCG/ACT IN AERS
4.0000 | INHALATION_SPRAY | Freq: Once | RESPIRATORY_TRACT | Status: AC
  Administered 2019-08-27: 4 via RESPIRATORY_TRACT
  Filled 2019-08-27: qty 6.7

## 2019-08-27 MED ORDER — IPRATROPIUM-ALBUTEROL 0.5-2.5 (3) MG/3ML IN SOLN
3.0000 mL | Freq: Once | RESPIRATORY_TRACT | Status: AC
  Administered 2019-08-27: 3 mL via RESPIRATORY_TRACT
  Filled 2019-08-27: qty 3

## 2019-08-27 MED ORDER — AEROCHAMBER PLUS FLO-VU MEDIUM MISC
1.0000 | Freq: Once | Status: AC
  Administered 2019-08-27: 1
  Filled 2019-08-27 (×2): qty 1

## 2019-08-27 MED ORDER — PREDNISONE 10 MG (21) PO TBPK
ORAL_TABLET | Freq: Every day | ORAL | 0 refills | Status: DC
Start: 2019-08-27 — End: 2019-09-15

## 2019-08-27 MED ORDER — AMOXICILLIN-POT CLAVULANATE 875-125 MG PO TABS: 1.0000 | ORAL_TABLET | Freq: Two times a day (BID) | ORAL | 0 refills | Status: DC

## 2019-08-27 MED ORDER — METHYLPREDNISOLONE SODIUM SUCC 125 MG IJ SOLR
125.0000 mg | Freq: Once | INTRAMUSCULAR | Status: AC
  Administered 2019-08-27: 125 mg via INTRAVENOUS
  Filled 2019-08-27: qty 2

## 2019-08-27 NOTE — Discharge Instructions (Addendum)
You were seen in the emergency department today for cough and trouble breathing.  We suspect you have a COPD exacerbation.  We are sending you home with steroids as well as Augmentin to treat this exacerbation.  We have prescribed you new medication(s) today. Discuss the medications prescribed today with your pharmacist as they can have adverse effects and interactions with your other medicines including over the counter and prescribed medications. Seek medical evaluation if you start to experience new or abnormal symptoms after taking one of these medicines, seek care immediately if you start to experience difficulty breathing, feeling of your throat closing, facial swelling, or rash as these could be indications of a more serious allergic reaction  Please use your albuterol inhaler 2 puffs every 4-6 hours continuously for the next 48 hours then as needed for trouble breathing/wheezing.  Please follow-up with your primary care provider within 3 days for reevaluation.  Return to the ER for new or worsening symptoms including but not limited to increased trouble breathing, chest pain, coughing up blood, passing out, or any other concerns.

## 2019-08-27 NOTE — ED Notes (Signed)
[facility] at this time will call EMS to transport patient back to facility.

## 2019-08-27 NOTE — ED Notes (Signed)
Patient given food and apple juice because she said that her blood sugar was getting low. Patient waiting on EMS transport. Pure Wick completely full. Pure wick canister changed.

## 2019-08-27 NOTE — ED Notes (Signed)
[facility] called to setup transport back to facility at this time. 

## 2019-08-27 NOTE — ED Triage Notes (Signed)
Patient brought in via EMS. Alert and oriented. Airway patent. Patient c/o shortness of breath with cough. Per patient productive cough, thick clear sputum. Denies any fevers. Per patient took cough syrup that aggravated cough more. Patient diabetic, per paramedic blood sugar 150.

## 2019-08-27 NOTE — ED Provider Notes (Signed)
[facility] Provider Note   CSN: [id number] Arrival date & time: 08/27/19  1120     History Chief Complaint  Patient presents with  . Shortness of Breath    [name] is a 69 y.o. female with a history of COPD, SVT, seizures, anxiety, asthma, diabetes mellitus, hypertension, and hypothyroidism who presents to the emergency department with complaints of cough that began this morning.  Patient states that she went outside and started to cough up thick yellow to white-colored sputum with associated shortness of breath.  No alleviating or aggravating factors to her symptoms.  No intervention prior to arrival.  She is unsure if this could be her allergies.  She does note that she has had some lower extremity swelling but this is not new and is bilateral. She denies fever, chills, chest pain, vomiting, diarrhea, abdominal pain, hemoptysis, prior cancer, or history of VTE.  She has received both of her Covid vaccines.   HPI     Past Medical History:  Diagnosis Date  . Allergic rhinitis    uses Nasonex daily as needed  . Allergy to angiotensin receptor blockers (ARB) 01/22/2014   Angioedema  . Anxiety    takes Ativan daily as needed  . Aspiration pneumonia ([place])   . Asthma    Albuterol inhaler and Neb daily as needed  . 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 [name], residual palpitations  . Bipolar 1 disorder ([place])   . Chronic back pain    reason unknown  . Constipation   . COPD (chronic obstructive pulmonary disease) ([place])   . DDD (degenerative disc disease), lumbar   . Depression    takes Zoloft daily as well as Cymbalta  . Diabetes mellitus    Type II  . Dysphagia   . Dysrhythmia    SVT ==ablation done by Dr. Rayann Heman 2007  . Esophageal reflux    takes Zantac daily  . Frequent headaches   . History of bronchitis Dec 2014  . Hypertension    takes Losartan daily  . Hypothyroidism    takes  Synthroid daily  . Joint pain   . Joint swelling    left ankle  . Low sodium syndrome   . Lumbar radiculopathy   . Seizures ([place]) 04-05-13   one time ran out of Primidone and had stopped it cold [place]  . SVT (supraventricular tachycardia) ([place])   . Tremor, essential    takes Mysoline and Neurontin daily.  . Vocal cord dysfunction   . Weakness    in left arm    Patient Active Problem List   Diagnosis Date Noted  . Uncomplicated asthma [id number]  . Skin erythema 09/28/2018  . Umbilical pain [id number]  . Constipation 03/18/2017  . CAP (community acquired pneumonia) 04/21/2016  . History of colonic polyps   . Diverticulosis of colon without hemorrhage   . Other hemorrhoids   . [name]'s ring   . Mucosal abnormality of stomach   . Dysphagia   . Encounter for screening colonoscopy 02/21/2015  . Upper airway cough syndrome 01/18/2015  . OAB (overactive bladder) 12/30/2014  . Incontinence in female 12/25/2014  . MDD (major depressive disorder), recurrent severe, without psychosis ([place]) 12/14/2014  . Thrush of mouth and esophagus ([place]) 11/23/2014  . Depression with somatization 11/05/2014  . Type II diabetes mellitus with manifestations ([place]) 10/23/2014  . Visit for screening mammogram 08/21/2014  . Hypokalemia 05/30/2014  . Tinea  corporis 05/30/2014  . Lumbar scoliosis 04/24/2014  . Obstructive sleep apnea 04/23/2014  . Moderate persistent asthma with acute bronchitis and acute exacerbation 03/02/2014  . Allergy to angiotensin receptor blockers (ARB) 01/22/2014  . S/P deep brain stimulator placement 01/10/2014  . Greater trochanteric bursitis of left hip 11/07/2013  . Spinal stenosis of lumbar region 10/30/2013  . Right thyroid nodule 10/12/2013  . CMC arthritis, thumb, degenerative 06/15/2013  . GERD (gastroesophageal reflux disease) 11/03/2012  . IBS (irritable bowel syndrome) 11/03/2012  . Esophageal stenosis 11/01/2012  . Asthma, moderate persistent,  poorly-controlled 09/01/2012  . Hypothyroidism 12/22/2011  . Hyperlipidemia with target LDL less than 100 06/22/2008  . Essential hypertension 09/04/2007  . TREMOR, ESSENTIAL 04/19/2007  . Seasonal and perennial allergic rhinitis 04/19/2007    Past Surgical History:  Procedure Laterality Date  . ABDOMINAL HYSTERECTOMY    . ABLATION FOR AVNRT    . APPENDECTOMY    . BARTHOLIN GLAND CYST EXCISION     x 2  . BIOPSY  03/04/2015   Procedure: BIOPSY (Duodenal, Gastric);  Surgeon: Daneil Dolin, MD;  Location: [facility];  Service: Endoscopy;;  . BIOPSY  12/19/2018   Procedure: BIOPSY;  Surgeon: Daneil Dolin, MD;  Location: [facility];  Service: Endoscopy;;  Gastric Biopsies    . BREAST SURGERY    . CESAREAN SECTION    . CHOLECYSTECTOMY    . COLONOSCOPY  01/16/2004   [id number] rectum/colon  . COLONOSCOPY WITH PROPOFOL N/A 03/04/2015   Dr.[name]- internal hemorrhoids, pancolonic diverticulosis, colonic polyp= tubular adenoma  . DEEP BRAIN STIMULATOR PLACEMENT    . DENTAL SURGERY    . ESOPHAGEAL DILATION N/A 03/04/2015   Procedure: ESOPHAGEAL DILATION WITH 54FR MALONEY DILATOR;  Surgeon: Daneil Dolin, MD;  Location: [facility];  Service: Endoscopy;  Laterality: N/A;  . ESOPHAGEAL DILATION  12/19/2018   Procedure: ESOPHAGEAL DILATION;  Surgeon: Daneil Dolin, MD;  [facility];  Service: Endoscopy;;  . ESOPHAGOGASTRODUODENOSCOPY (EGD) WITH ESOPHAGEAL DILATION  04/03/2002   [id number] ring, otherwise normal esophagus, status post dilation with 56 F/Normal stomach  . ESOPHAGOGASTRODUODENOSCOPY (EGD) WITH ESOPHAGEAL DILATION N/A 11/08/2012   Dr. [name]:schatzki's ring s/p dilation/hiatal hernia  . ESOPHAGOGASTRODUODENOSCOPY (EGD) WITH PROPOFOL N/A 03/04/2015   Dr.[name]- schatzki's ring, hiatal hernia, scattered erosions bx= chronic inactive gastritis  . ESOPHAGOGASTRODUODENOSCOPY (EGD) WITH PROPOFOL N/A 05/06/2017   Normal esophagus. Dilated. Small hiatal hernia. Normal  duodenal bulb and second portion of duodenum.  . ESOPHAGOGASTRODUODENOSCOPY (EGD) WITH PROPOFOL N/A 12/19/2018   Procedure: ESOPHAGOGASTRODUODENOSCOPY (EGD) WITH PROPOFOL;  Surgeon: Daneil Dolin, MD;  [facility];  Service: Endoscopy;  Laterality: N/A;  11:15am  . FINGER SURGERY    . HEMORRHOID BANDING  2017   Dr.[name]  . LEFT ANKLE LIGAMENT REPAIR     x 2  . LEFT ELBOW REPAIR    . MALONEY DILATION N/A 05/06/2017   Procedure: Venia Minks DILATION;  Surgeon: Daneil Dolin, MD;  [facility];  Service: Endoscopy;  Laterality: N/A;  . Venia Minks DILATION N/A 12/19/2018   Procedure: Venia Minks DILATION;  Surgeon: Daneil Dolin, MD;  [facility];  Service: Endoscopy;  Laterality: N/A;  . MAXIMUM ACCESS (MAS)POSTERIOR LUMBAR INTERBODY FUSION (PLIF) 1 LEVEL N/A 04/24/2014   Procedure: Lumbar four-five Maximum access Surgery posterior lumbar interbody fusion;  Surgeon: [name], MD;  Location: [facility];  Service: Neurosurgery;  Laterality: N/A;  Lumbar four-five Maximum access Surgery posterior lumbar interbody fusion  .  MULTIPLE EXTRACTIONS WITH ALVEOLOPLASTY N/A 06/18/2017   Procedure: MULTIPLE EXTRACTION;  Surgeon: Diona Browner, DDS;  Location: [facility];  Service: Oral Surgery;  Laterality: N/A;  . PARTIAL HYSTERECTOMY    . POLYPECTOMY  03/04/2015   Procedure: POLYPECTOMY (descending colon);  Surgeon: Daneil Dolin, MD;  Location: [facility];  Service: Endoscopy;;  . PULSE GENERATOR IMPLANT Bilateral 12/15/2013   Procedure: BILATERAL PULSE GENERATOR IMPLANT;  Surgeon: [name], MD;  Location: [facility];  Service: Neurosurgery;  Laterality: Bilateral;  BILATERAL  . RIGHT BREAST CYST     benign  . SUBTHALAMIC STIMULATOR BATTERY REPLACEMENT Bilateral 06/24/2018   Procedure: Bilateral implantable pulse generator battery change;  Surgeon: [name], MD;  Location: [facility];  Service: Neurosurgery;  Laterality: Bilateral;  bilateral  . SUBTHALAMIC STIMULATOR  INSERTION Bilateral 12/04/2013   Procedure: Bilateral Deep brain stimulator placement;  Surgeon: [name], MD;  Location: [facility];  Service: Neurosurgery;  Laterality: Bilateral;  Bilateral Deep brain stimulator placement  . TONSILLECTOMY       OB History    Gravida  2   Para  2   Term  2   Preterm      AB      Living  1     SAB      TAB      Ectopic      Multiple      Live Births              Family History  Problem Relation Age of Onset  . Lung cancer Father        DIED AGE 73 LUNG CA  . Alcohol abuse Father   . Anxiety disorder Father   . Depression Father   . COPD Mother        DIED AGE 42,MRSA,COPD,PNEUMONIA  . Pneumonia Mother        DIED AGE 42,MRSA,COPD,PNEUMONIA  . Supraventricular tachycardia Mother   . Anxiety disorder Mother   . Depression Mother   . Alcohol abuse Mother   . Heart disease Sister        DIED AGE 43, SMOKER,?HEART  . COPD Brother        AGE 68  . Colon cancer Neg Hx     Social History   Tobacco Use  . Smoking status: Former Smoker    Packs/day: 0.30    Years: 10.00    Pack years: 3.00    Types: Cigarettes    Quit date: 04/23/1993    Years since quitting: 26.3  . Smokeless tobacco: Never Used  . Tobacco comment: quit smoking 25+yrs ago  Substance Use Topics  . Alcohol use: No    Alcohol/week: 0.0 standard drinks  . Drug use: No    Home Medications Prior to Admission medications   Medication Sig Start Date End Date Taking? Authorizing Provider  acetaminophen (TYLENOL) 500 MG tablet Take 500 mg by mouth every 4 (four) hours as needed for mild pain, fever or headache.     [provider]  albuterol (PROAIR HFA) 108 (90 Base) MCG/ACT inhaler Inhale 2 puffs every 6 hours for asthma - rescue inhaler Patient taking differently: Inhale 2 puffs into the lungs every 6 (six) hours as needed for shortness of breath.  05/28/17   Baird Lyons D, MD  albuterol (PROVENTIL) (2.5 MG/3ML) 0.083% nebulizer solution  Take 2.5 mg by nebulization every 6 (six) hours as needed for wheezing or shortness of breath.    [provider]  alum & mag hydroxide-simeth (MAALOX/MYLANTA) 200-200-20 MG/5ML suspension Take 30 mLs by mouth as needed for indigestion or heartburn. Max 4 doses per 24 hours    [provider]  aspirin 81 MG chewable tablet Chew 81 mg by mouth daily.     [provider]  baclofen (LIORESAL) 10 MG tablet Take 10 mg by mouth at bedtime.    [provider]  Carboxymethylcellulose Sod PF (REFRESH PLUS) 0.5 % SOLN Place 1 drop into both eyes 2 (two) times daily.     [provider]  Cholecalciferol (VITAMIN D-3) 125 MCG (5000 UT) TABS Take by mouth daily.    [provider]  divalproex (DEPAKOTE) 500 MG DR tablet Take 500 mg by mouth 3 (three) times daily.     [provider]  docusate sodium (COLACE) 100 MG capsule Take 100 mg by mouth daily.    [provider]  ENULOSE 10 GM/15ML SOLN Take 10 g by mouth daily as needed.  08/22/18   [provider]  fluticasone furoate-vilanterol (BREO ELLIPTA) 100-25 MCG/INH AEPB Inhale 1 puff into the lungs daily. Patient taking differently: Inhale 1 puff into the lungs daily. RINSE MOUTH WITH WATER AFTER USE 12/17/16   Baird Lyons D, MD  gabapentin (NEURONTIN) 300 MG capsule Take 300-600 mg by mouth See admin instructions. Take 300 mg by mouth in the morning and 600 mg at bedtime    [provider]  glucose 4 GM chewable tablet Chew 5 tablets by mouth as needed for low blood sugar.    [provider]  guaifenesin (ROBITUSSIN) 100 MG/5ML syrup Take 200 mg by mouth as needed for cough.    [provider]  HYDROcodone-acetaminophen (NORCO/VICODIN) 5-325 MG tablet Take 1 tablet by mouth 3 (three) times daily.    [provider]  hydrocortisone 1 % ointment Apply 1 application topically 2 (two) times daily.    [provider]  insulin glargine  (LANTUS) 100 UNIT/ML injection Inject 15 Units into the skin at bedtime. *Hold if blood glucose is less than 160*    [provider]  ipratropium (ATROVENT) 0.03 % nasal spray 1-2 puffs each nostril 3 times daily, before meals Patient taking differently: Place 1 spray into both nostrils 3 (three) times daily before meals.  01/12/17   Deneise Lever, MD  levothyroxine (SYNTHROID, LEVOTHROID) 75 MCG tablet TAKE (1) TABLET BY MOUTH ONCE DAILY BEFORE BREAKFAST. Patient taking differently: Take 75 mcg by mouth daily before breakfast.  01/12/15   Janith Lima, MD  loperamide (IMODIUM A-D) 2 MG tablet Take 2 mg by mouth 4 (four) times daily as needed for diarrhea or loose stools.     [provider]  lubiprostone (AMITIZA) 8 MCG capsule Take 1 capsule (8 mcg total) by mouth 2 (two) times daily with a meal. 06/27/19   Carlis Stable, NP  magnesium hydroxide (MILK OF MAGNESIA) 400 MG/5ML suspension Take 30 mLs by mouth at bedtime as needed for mild constipation.     [provider]  Menthol, Topical Analgesic, (BIOFREEZE) 4 % GEL Apply 1 application topically 2 (two) times daily. Apply to lower back and base of right thumb twice daily.    [provider]  metoprolol succinate (TOPROL-XL) 50 MG 24 hr tablet Take 50 mg by mouth daily. Take with or immediately following a meal.    [provider]  Multiple Vitamin (MULTIVITAMIN) capsule Take 1 capsule by mouth daily.    [provider]  Multiple Vitamins-Minerals (MULTIVITAMINS THER. W/MINERALS) TABS tablet TAKE ONE TABLET BY MOUTH ONCE DAILY. Patient taking differently: Take 1 tablet by mouth daily.  04/16/15   Janith Lima, MD  neomycin-bacitracin-polymyxin (NEOSPORIN) ointment Apply 1 application topically as needed for wound care.    [provider]  NOVOLOG FLEXPEN 100 UNIT/ML FlexPen Inject 8 Units into the skin 3 (three) times daily before meals.  07/01/15   [provider]  Omega 3  1000 MG CAPS TAKE (1) CAPSULE BY MOUTH ONCE DAILY. Patient taking differently: Take 1,000 mg by mouth daily.  04/16/15   Janith Lima, MD  pantoprazole (PROTONIX) 40 MG tablet Take 40 mg by mouth daily.    [provider]  PARoxetine (PAXIL) 10 MG tablet Take 10 mg by mouth daily.  10/07/18   [provider]  Polyethyl Glycol-Propyl Glycol (SYSTANE ULTRA) 0.4-0.3 % SOLN Place 1 drop into both eyes 4 (four) times daily as needed (dry eyes).    [provider]  polyethylene glycol (MIRALAX / GLYCOLAX) 17 g packet Take 17 g by mouth daily.    [provider]  polyethylene glycol (MIRALAX / GLYCOLAX) packet Take 17 g by mouth daily as needed for moderate constipation. 10/01/17   Orpah Greek, MD  potassium chloride SA (K-DUR,KLOR-CON) 20 MEQ tablet Take 20 mEq by mouth daily.    [provider]  primidone (MYSOLINE) 50 MG tablet Take 50 mg by mouth daily.     [provider]  QUEtiapine (SEROQUEL) 100 MG tablet Take 100 mg by mouth at bedtime.    [provider]  Respiratory Therapy Supplies (FLUTTER) DEVI Use flutter device 3 times a day 01/11/18   Martyn Ehrich, NP  simvastatin (ZOCOR) 5 MG tablet Take 5 mg by mouth at bedtime.  08/21/17   [provider]  sitaGLIPtin (JANUVIA) 100 MG tablet Take 100 mg by mouth daily.    [provider]  sodium chloride (OCEAN) 0.65 % SOLN nasal spray Take 1-2 sprays in nostril every hour as needed for rhinitis Patient taking differently: Place 1 spray into both nostrils every hour as needed for congestion.  06/08/17   Baird Lyons D, MD  sodium chloride 1 g tablet Take 1 g by mouth 2 (two) times daily.    [provider]  sucralfate (CARAFATE) 1 g tablet Take 1 g by mouth 2 (two) times daily. ON AN EMPTY STOMACH 04/24/16   [provider]  zinc gluconate 50 MG tablet Take 50 mg by mouth daily.    [provider]    Allergies    Invokana  [canagliflozin], Losartan, Metformin and related, Latex, Other, Oxycodone-acetaminophen, and Mucinex [guaifenesin er]  Review of Systems   Review of Systems  Constitutional: Negative for chills and fever.  HENT: Negative for congestion, ear pain and sore throat.   Respiratory: Positive for cough and shortness of breath.   Cardiovascular: Positive for leg swelling. Negative for chest pain.  Gastrointestinal: Negative for abdominal pain, diarrhea and vomiting.  Neurological: Negative for syncope.  All other systems reviewed and are negative.   Physical Exam Updated Vital Signs BP (!) 125/52   Pulse 83   Temp 98.9 F (37.2 C) (Oral)   Resp 18   Ht 5' 4.5" (1.638 m)   Wt 87.5 kg   SpO2 100%   BMI 32.62 kg/m   Physical Exam Vitals and nursing note reviewed.  Constitutional:      General: She is not in acute  distress.    Appearance: She is well-developed. She is not toxic-appearing.  HENT:     Head: Normocephalic and atraumatic.  Eyes:     General:        Right eye: No discharge.        Left eye: No discharge.     Conjunctiva/sclera: Conjunctivae normal.  Cardiovascular:     Rate and Rhythm: Normal rate and regular rhythm.  Pulmonary:     Effort: No respiratory distress.     Breath sounds: Transmitted upper airway sounds present. Wheezing and rhonchi present.     Comments: Abnormal breath sounds throughout, however worse at the bases. Abdominal:     General: There is no distension.     Palpations: Abdomen is soft.     Tenderness: There is no abdominal tenderness.  Musculoskeletal:     Cervical back: Neck supple.     Comments: Patient has 2+ symmetric pitting edema to the bilateral lower legs.  No significant erythema or warmth.  Skin:    General: Skin is warm and dry.     Findings: No rash.  Neurological:     Mental Status: She is alert.     Comments: Clear speech.   Psychiatric:        Behavior: Behavior normal.    ED Results / Procedures / Treatments    Labs (all labs ordered are listed, but only abnormal results are displayed) Labs Reviewed  CBC WITH DIFFERENTIAL/PLATELET - Abnormal; Notable for the following components:      Result Value   RBC 3.70 (*)    MCV 101.9 (*)    MCH 34.3 (*)    Platelets 105 (*)    All other components within normal limits  COMPREHENSIVE METABOLIC PANEL - Abnormal; Notable for the following components:   Sodium 134 (*)    Chloride 96 (*)    Glucose, Bld 166 (*)    All other components within normal limits  SARS CORONAVIRUS 2 BY RT PCR (HOSPITAL ORDER, [facility])  BRAIN NATRIURETIC PEPTIDE  URINALYSIS, ROUTINE W REFLEX MICROSCOPIC    EKG None  Radiology DG Chest Port 1 View  Result Date: 08/27/2019 CLINICAL DATA:  Dyspnea EXAM: PORTABLE CHEST 1 VIEW COMPARISON:  08/02/2019 FINDINGS: The heart size and mediastinal contours are within normal limits. Both lungs are clear. The visualized skeletal structures are unremarkable. IMPRESSION: No acute abnormality of the lungs in [facility] portable projection. Electronically Signed   By: Eddie Candle M.D.   On: 08/27/2019 13:17    Procedures Procedures (including critical care time)  Medications Ordered in ED Medications  methylPREDNISolone sodium succinate (SOLU-MEDROL) 125 mg/2 mL injection 125 mg (has no administration in time range)  albuterol (VENTOLIN HFA) 108 (90 Base) MCG/ACT inhaler 4 puff (4 puffs Inhalation Given 08/27/19 1235)  AeroChamber Plus Flo-Vu Medium MISC 1 each (1 each Other Given 08/27/19 1236)  ipratropium-albuterol (DUONEB) 0.5-2.5 (3) MG/3ML nebulizer solution 3 mL (3 mLs Nebulization Given 08/27/19 1544)    ED Course  I have reviewed the triage vital signs and the nursing notes.  Pertinent labs & imaging results that were available during my care of the patient were reviewed by me and considered in my medical decision making (see chart for details).    Leeandra Oelke was evaluated in Emergency  Department on 08/27/2019 for the symptoms described in the history of present illness. He/she was evaluated in the context of the global COVID-19 pandemic, which necessitated consideration that the patient  might be at risk for infection with the SARS-CoV-2 virus that causes COVID-19. Institutional protocols and algorithms that pertain to the evaluation of patients at risk for COVID-19 are in a state of rapid change based on information released by regulatory bodies including the CDC and federal and state organizations. These policies and algorithms were followed during the patient's care in the ED.  MDM Rules/Calculators/A&P                     Patient presents to the ED with complaints of cough and dyspnea which began today.  She is nontoxic, resting comfortably, her vitals are without significant abnormality.  She does not appear to be in respiratory distress.  She has a productive cough while I am in the exam room, transmitted upper airway sounds limit lung exam but she is wheezing with rhonchi.  Will give 4 puffs of albuterol with a spacer.  Care initiated with labs, Covid swab, chest x-ray and EKG.  Ddx: Pneumonia, COPD exacerbation, new onset CHF, allergies, pneumothorax, critical anemia, COVID-19, PE.   Additional history obtained:  Additional history obtained from nursing note and prior visit reviewed.  EKG: significant artifact, no STEMI Lab Tests:  I Ordered, reviewed, and interpreted labs, which included:  CBC: No significant anemia or leukocytosis.  Mild thrombocytopenia similar to prior. CMP: Hyperglycemia without acidosis or anion gap elevation.  No significant electrolyte derangement. BNP: Within normal limits. Urinalysis: No UTI COVID testing: Negative Imaging Studies ordered:  I ordered imaging studies which included CXR, I independently visualized and interpreted imaging which showed No acute abnormality of the lungs in [facility] portable projection  ED Course:  Re-assuring work-up.   Overall H&P seems consistent with COPD exacerbation.  15:40: Lungs remain with wheezing at the bases. Duoneb & steroids ordered.   16:20: RE-EVAL: Breath sounds significant improved, patient is not hypoxic, she is not in respiratory distress, she does not ambulate at baseline (uses wheelchair). She appears appropriate for discharge home with steroids & abx with PCP follow up. I discussed results, treatment plan, need for follow-up, and return precautions with the patient. Provided opportunity for questions, patient confirmed understanding and is in agreement with plan.   Findings and plan of care discussed with supervising physician Dr. Rogene Houston who is in agreement.   Portions of this note were generated with [employer]obby[employer]. Dictation errors may occur despite best attempts at proofreading.  Final Clinical Impression(s) / ED Diagnoses Final diagnoses:  COPD exacerbation ([place])    Rx / DC Orders ED Discharge Orders         Ordered    predniSONE (STERAPRED UNI-PAK 21 TAB) 10 MG (21) TBPK tablet  Daily     08/27/19 1646    amoxicillin-clavulanate (AUGMENTIN) 875-125 MG tablet  Every 12 hours     08/27/19 1646           Rohith Fauth, [place] R, PA-C 08/27/19 1647    Fredia Sorrow, MD 09/26/19 2240

## 2019-08-28 ENCOUNTER — Telehealth: Payer: Self-pay | Admitting: Internal Medicine

## 2019-08-28 MED ORDER — ALBUTEROL SULFATE (2.5 MG/3ML) 0.083% IN NEBU: 2.5000 mg | INHALATION_SOLUTION | Freq: Four times a day (QID) | RESPIRATORY_TRACT | 11 refills | Status: DC | PRN

## 2019-08-28 NOTE — Telephone Encounter (Signed)
[facility] and spoke with Moro about pt's call. Stated to her that we are able to see the notes from pt's recent ED visit. Also stated to her that pt called and was needing Rx for neb sol to be called in. Per [facility], pt had 4 boxes of albuterol neb sol but stated if we wanted to refill Rx we could. Since the Rx is an old Rx, I have sent refill to pharmacy for pt. Nothing further needed.

## 2019-08-31 ENCOUNTER — Other Ambulatory Visit: Payer: Self-pay

## 2019-08-31 ENCOUNTER — Ambulatory Visit (INDEPENDENT_AMBULATORY_CARE_PROVIDER_SITE_OTHER): Payer: Medicare Other | Admitting: Primary Care

## 2019-08-31 DIAGNOSIS — J4540 Moderate persistent asthma, uncomplicated: Secondary | ICD-10-CM

## 2019-08-31 DIAGNOSIS — J449 Chronic obstructive pulmonary disease, unspecified: Secondary | ICD-10-CM | POA: Diagnosis not present

## 2019-08-31 NOTE — Patient Instructions (Addendum)
Recommendations: Continue Breo inhaler one puff daily as prescribed  Advise Robitussin 12 hour cough relief twice daily  Finish amoxicillin as prescribed Finish prednisone taper as prescribe   Follow-up: 2-3 months with Dr. Annamaria Boots or sooner if needed

## 2019-08-31 NOTE — Progress Notes (Signed)
Virtual Visit via Telephone Note  I connected with [name] on 08/31/19 at 11:30 AM EDT by telephone and verified that I am speaking with the correct person using two identifiers.  Location: Patient: Home Provider: Office   I discussed the limitations, risks, security and privacy concerns of performing an evaluation and management service by telephone and the availability of in person appointments. I also discussed with the patient that there may be a patient responsible charge related to this service. The patient expressed understanding and agreed to proceed.   History of Present Illness: 69 year old female, former smoker. PMH significant for moderate persistent asthma, upper airway cough syndrome, OSA, seasonal allergic rhinitis, hypertension, GERD, esophageal stenosis, hypothyroidism, type 2 diabetes, MDD, tremor. Patient of Dr. Annamaria Boots, she was scheduled to be seen on 5/21 but left without being seen. She lives in [place].  08/31/2019 Patient was seen in ED on 5/23 for COPD exacerbation treated with Amoxicillin-clavulanate and prednisone taper. She had originally reported a cough and dyspnea x 1 day. She is feeling better, reports that her breathing is "good". She continues to have a productive cough with clear mucus. She has been taking generic robitussin which has helped. States that she is not currently coughing. Reports compliance with Breo 100 one puff daily. Thinks she might have a sensitivity milk making her cough/mucus production worse. Denies shortness of breath.   Observations/Objective:  - Able to speak in full sentences; no overt shortness of breath, wheezing or cough   Assessment and Plan:   COPD exacerbation: - Continue course of Augmentin and prednisone taper per ED until completed - Take guaifenesin 64ml (200mg ) every 6 hours for cough    Moderate persistent asthma: - Continue Breo 100 one puff daily; albuterol hfa 2 puffs every 6 hours prn shortness of  breath/wheezing   Follow Up Instructions: 2-3 months with Dr. Annamaria Boots or sooner if needed    I discussed the assessment and treatment plan with the patient. The patient was provided an opportunity to ask questions and all were answered. The patient agreed with the plan and demonstrated an understanding of the instructions.   The patient was advised to call back or seek an in-person evaluation if the symptoms worsen or if the condition fails to improve as anticipated.  I provided 18 minutes of non-face-to-face time during this encounter.   Martyn Ehrich, NP

## 2019-09-08 ENCOUNTER — Other Ambulatory Visit: Payer: Self-pay

## 2019-09-08 ENCOUNTER — Ambulatory Visit (INDEPENDENT_AMBULATORY_CARE_PROVIDER_SITE_OTHER): Payer: Medicare Other | Admitting: Podiatry

## 2019-09-08 DIAGNOSIS — E1142 Type 2 diabetes mellitus with diabetic polyneuropathy: Secondary | ICD-10-CM | POA: Diagnosis not present

## 2019-09-08 DIAGNOSIS — S91205A Unspecified open wound of left lesser toe(s) with damage to nail, initial encounter: Secondary | ICD-10-CM

## 2019-09-08 DIAGNOSIS — S91209A Unspecified open wound of unspecified toe(s) with damage to nail, initial encounter: Secondary | ICD-10-CM

## 2019-09-10 ENCOUNTER — Emergency Department (HOSPITAL_COMMUNITY): Payer: Medicare Other

## 2019-09-10 ENCOUNTER — Inpatient Hospital Stay (HOSPITAL_COMMUNITY)
Admission: EM | Admit: 2019-09-10 | Discharge: 2019-09-15 | DRG: 177 | Disposition: A | Discharge status: Skilled Nursing Facility | Payer: Medicare Other | Source: Skilled Nursing Facility | Attending: Internal Medicine | Admitting: Internal Medicine

## 2019-09-10 ENCOUNTER — Encounter (HOSPITAL_COMMUNITY): Payer: Self-pay | Admitting: Emergency Medicine

## 2019-09-10 ENCOUNTER — Other Ambulatory Visit: Payer: Self-pay

## 2019-09-10 DIAGNOSIS — G92 Toxic encephalopathy: Secondary | ICD-10-CM | POA: Diagnosis present

## 2019-09-10 DIAGNOSIS — G250 Essential tremor: Secondary | ICD-10-CM | POA: Diagnosis present

## 2019-09-10 DIAGNOSIS — D539 Nutritional anemia, unspecified: Secondary | ICD-10-CM

## 2019-09-10 DIAGNOSIS — R569 Unspecified convulsions: Secondary | ICD-10-CM | POA: Diagnosis present

## 2019-09-10 DIAGNOSIS — E1165 Type 2 diabetes mellitus with hyperglycemia: Secondary | ICD-10-CM | POA: Diagnosis present

## 2019-09-10 DIAGNOSIS — Z86010 Personal history of colonic polyps: Secondary | ICD-10-CM

## 2019-09-10 DIAGNOSIS — R339 Retention of urine, unspecified: Secondary | ICD-10-CM | POA: Diagnosis not present

## 2019-09-10 DIAGNOSIS — I10 Essential (primary) hypertension: Secondary | ICD-10-CM | POA: Diagnosis present

## 2019-09-10 DIAGNOSIS — K219 Gastro-esophageal reflux disease without esophagitis: Secondary | ICD-10-CM | POA: Diagnosis present

## 2019-09-10 DIAGNOSIS — J189 Pneumonia, unspecified organism: Secondary | ICD-10-CM

## 2019-09-10 DIAGNOSIS — Z818 Family history of other mental and behavioral disorders: Secondary | ICD-10-CM

## 2019-09-10 DIAGNOSIS — Z91040 Latex allergy status: Secondary | ICD-10-CM

## 2019-09-10 DIAGNOSIS — I959 Hypotension, unspecified: Secondary | ICD-10-CM | POA: Diagnosis present

## 2019-09-10 DIAGNOSIS — U071 COVID-19: Principal | ICD-10-CM | POA: Diagnosis present

## 2019-09-10 DIAGNOSIS — Z8616 Personal history of COVID-19: Secondary | ICD-10-CM

## 2019-09-10 DIAGNOSIS — E785 Hyperlipidemia, unspecified: Secondary | ICD-10-CM | POA: Diagnosis present

## 2019-09-10 DIAGNOSIS — M5116 Intervertebral disc disorders with radiculopathy, lumbar region: Secondary | ICD-10-CM | POA: Diagnosis present

## 2019-09-10 DIAGNOSIS — J440 Chronic obstructive pulmonary disease with acute lower respiratory infection: Secondary | ICD-10-CM | POA: Diagnosis present

## 2019-09-10 DIAGNOSIS — Z79899 Other long term (current) drug therapy: Secondary | ICD-10-CM

## 2019-09-10 DIAGNOSIS — T402X1A Poisoning by other opioids, accidental (unintentional), initial encounter: Secondary | ICD-10-CM | POA: Diagnosis present

## 2019-09-10 DIAGNOSIS — Z7982 Long term (current) use of aspirin: Secondary | ICD-10-CM

## 2019-09-10 DIAGNOSIS — D696 Thrombocytopenia, unspecified: Secondary | ICD-10-CM

## 2019-09-10 DIAGNOSIS — E871 Hypo-osmolality and hyponatremia: Secondary | ICD-10-CM | POA: Diagnosis present

## 2019-09-10 DIAGNOSIS — J9601 Acute respiratory failure with hypoxia: Secondary | ICD-10-CM | POA: Diagnosis present

## 2019-09-10 DIAGNOSIS — R4182 Altered mental status, unspecified: Secondary | ICD-10-CM

## 2019-09-10 DIAGNOSIS — G894 Chronic pain syndrome: Secondary | ICD-10-CM | POA: Diagnosis present

## 2019-09-10 DIAGNOSIS — G2401 Drug induced subacute dyskinesia: Secondary | ICD-10-CM | POA: Diagnosis present

## 2019-09-10 DIAGNOSIS — Z79890 Hormone replacement therapy (postmenopausal): Secondary | ICD-10-CM

## 2019-09-10 DIAGNOSIS — J309 Allergic rhinitis, unspecified: Secondary | ICD-10-CM | POA: Diagnosis present

## 2019-09-10 DIAGNOSIS — E118 Type 2 diabetes mellitus with unspecified complications: Secondary | ICD-10-CM

## 2019-09-10 DIAGNOSIS — F319 Bipolar disorder, unspecified: Secondary | ICD-10-CM | POA: Diagnosis present

## 2019-09-10 DIAGNOSIS — F419 Anxiety disorder, unspecified: Secondary | ICD-10-CM | POA: Diagnosis present

## 2019-09-10 DIAGNOSIS — Z825 Family history of asthma and other chronic lower respiratory diseases: Secondary | ICD-10-CM

## 2019-09-10 DIAGNOSIS — Z794 Long term (current) use of insulin: Secondary | ICD-10-CM

## 2019-09-10 DIAGNOSIS — Z87891 Personal history of nicotine dependence: Secondary | ICD-10-CM

## 2019-09-10 DIAGNOSIS — E039 Hypothyroidism, unspecified: Secondary | ICD-10-CM | POA: Diagnosis present

## 2019-09-10 DIAGNOSIS — Z888 Allergy status to other drugs, medicaments and biological substances status: Secondary | ICD-10-CM

## 2019-09-10 DIAGNOSIS — T380X5A Adverse effect of glucocorticoids and synthetic analogues, initial encounter: Secondary | ICD-10-CM | POA: Diagnosis present

## 2019-09-10 DIAGNOSIS — Z885 Allergy status to narcotic agent status: Secondary | ICD-10-CM

## 2019-09-10 DIAGNOSIS — Z9049 Acquired absence of other specified parts of digestive tract: Secondary | ICD-10-CM

## 2019-09-10 DIAGNOSIS — J1282 Pneumonia due to coronavirus disease 2019: Secondary | ICD-10-CM | POA: Diagnosis present

## 2019-09-10 LAB — VALPROIC ACID LEVEL: Valproic Acid Lvl: 95 ug/mL (ref 50.0–100.0)

## 2019-09-10 LAB — CBC WITH DIFFERENTIAL/PLATELET
Abs Immature Granulocytes: 0.01 10*3/uL (ref 0.00–0.07)
Basophils Absolute: 0 10*3/uL (ref 0.0–0.1)
Basophils Relative: 0 %
Eosinophils Absolute: 0.1 10*3/uL (ref 0.0–0.5)
Eosinophils Relative: 2 %
HCT: 32.9 % — ABNORMAL LOW (ref 36.0–46.0)
Hemoglobin: 11.2 g/dL — ABNORMAL LOW (ref 12.0–15.0)
Immature Granulocytes: 0 %
Lymphocytes Relative: 33 %
Lymphs Abs: 1.8 10*3/uL (ref 0.7–4.0)
MCH: 34.4 pg — ABNORMAL HIGH (ref 26.0–34.0)
MCHC: 34 g/dL (ref 30.0–36.0)
MCV: 100.9 fL — ABNORMAL HIGH (ref 80.0–100.0)
Monocytes Absolute: 0.4 10*3/uL (ref 0.1–1.0)
Monocytes Relative: 7 %
Neutro Abs: 3.2 10*3/uL (ref 1.7–7.7)
Neutrophils Relative %: 58 %
Platelets: 100 10*3/uL — ABNORMAL LOW (ref 150–400)
RBC: 3.26 MIL/uL — ABNORMAL LOW (ref 3.87–5.11)
RDW: 12.9 % (ref 11.5–15.5)
WBC: 5.6 10*3/uL (ref 4.0–10.5)
nRBC: 0 % (ref 0.0–0.2)

## 2019-09-10 LAB — COMPREHENSIVE METABOLIC PANEL
ALT: 14 U/L (ref 0–44)
AST: 26 U/L (ref 15–41)
Albumin: 3.3 g/dL — ABNORMAL LOW (ref 3.5–5.0)
Alkaline Phosphatase: 42 U/L (ref 38–126)
Anion gap: 9 (ref 5–15)
BUN: 10 mg/dL (ref 8–23)
CO2: 26 mmol/L (ref 22–32)
Calcium: 8.6 mg/dL — ABNORMAL LOW (ref 8.9–10.3)
Chloride: 96 mmol/L — ABNORMAL LOW (ref 98–111)
Creatinine, Ser: 0.68 mg/dL (ref 0.44–1.00)
GFR calc Af Amer: >60 mL/min (ref >60)
GFR calc non Af Amer: >60 mL/min (ref >60)
Glucose, Bld: 157 mg/dL — ABNORMAL HIGH (ref 70–99)
Potassium: 4 mmol/L (ref 3.5–5.1)
Sodium: 131 mmol/L — ABNORMAL LOW (ref 135–145)
Total Bilirubin: 1.1 mg/dL (ref 0.3–1.2)
Total Protein: 6.3 g/dL — ABNORMAL LOW (ref 6.5–8.1)

## 2019-09-10 LAB — BLOOD GAS, ARTERIAL
Acid-Base Excess: 3.2 mmol/L — ABNORMAL HIGH (ref 0.0–2.0)
Bicarbonate: 27 mmol/L (ref 20.0–28.0)
FIO2: 37
O2 Saturation: 94.7 %
Patient temperature: 37
pCO2 arterial: 43.8 mmHg (ref 32.0–48.0)
pH, Arterial: 7.413 (ref 7.350–7.450)
pO2, Arterial: 73.8 mmHg — ABNORMAL LOW (ref 83.0–108.0)

## 2019-09-10 LAB — RAPID URINE DRUG SCREEN, HOSP PERFORMED
Amphetamines: NOT DETECTED
Barbiturates: POSITIVE — AB
Benzodiazepines: NOT DETECTED
Cocaine: NOT DETECTED
Opiates: POSITIVE — AB
Tetrahydrocannabinol: NOT DETECTED

## 2019-09-10 LAB — URINALYSIS, ROUTINE W REFLEX MICROSCOPIC
Bilirubin Urine: NEGATIVE
Glucose, UA: 50 mg/dL — AB
Hgb urine dipstick: NEGATIVE
Ketones, ur: 20 mg/dL — AB
Leukocytes,Ua: NEGATIVE
Nitrite: NEGATIVE
Protein, ur: NEGATIVE mg/dL
Specific Gravity, Urine: 1.023 (ref 1.005–1.030)
pH: 6 (ref 5.0–8.0)

## 2019-09-10 LAB — LACTIC ACID, PLASMA: Lactic Acid, Venous: 0.9 mmol/L (ref 0.5–1.9)

## 2019-09-10 LAB — AMMONIA: Ammonia: 12 umol/L (ref 9–35)

## 2019-09-10 LAB — CBG MONITORING, ED: Glucose-Capillary: 148 mg/dL — ABNORMAL HIGH (ref 70–99)

## 2019-09-10 LAB — ETHANOL: Alcohol, Ethyl (B): <10 mg/dL (ref <10)

## 2019-09-10 NOTE — ED Notes (Signed)
Pt placed on 2LPM via [place] 

## 2019-09-10 NOTE — ED Notes (Signed)
Pt's O2 dropped to 84% on RA. This RN placed pt back on O2, O2 increased to 97% on 2LPM.

## 2019-09-10 NOTE — ED Triage Notes (Signed)
[facility] - pt fell at [facility]. Pt hard to arouse in triage with snoring respirations at times. Per EMS, SNF staff states that the pt is normally like this at night after she takes her narcotics. Staff states pt refused her narcotics and lantus tonight, and think the pt is hoarding some medication in her room. Pupils are pinpoint.

## 2019-09-10 NOTE — ED Provider Notes (Signed)
[facility] EMERGENCY DEPARTMENT Provider Note   CSN: [id number] Arrival date & time: 09/10/19  2234   History Chief Complaint  Patient presents with  . Fall    [name] is a 69 y.o. female.  The history is provided by the nursing home. The history is limited by the condition of the patient (Altered mental status).  Fall  She has history of hypertension, diabetes, hyperlipidemia, COPD and was sent from the skilled nursing center where she lives because of altered mental status.  She was reported to be acting like she was drunk since last night and was noted to be somnolent today.  Apparently, she frequently becomes somnolent after taking her nighttime medications, but she refused those medications today.  She is on hydrocodone-acetaminophen 5-325 3 times a day.  Other medications she is on that might contribute to altered mentation include divalproex, gabapentin, paroxetine, baclofen, quetiapine.  Past Medical History:  Diagnosis Date  . Allergic rhinitis    uses Nasonex daily as needed  . Allergy to angiotensin receptor blockers (ARB) 01/22/2014   Angioedema  . Anxiety    takes Ativan daily as needed  . Aspiration pneumonia ([place])   . Asthma    Albuterol inhaler and Neb daily as needed  . 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 [name], residual palpitations  . Bipolar 1 disorder ([place])   . Chronic back pain    reason unknown  . Constipation   . COPD (chronic obstructive pulmonary disease) ([place])   . DDD (degenerative disc disease), lumbar   . Depression    takes Zoloft daily as well as Cymbalta  . Diabetes mellitus    Type II  . Dysphagia   . Dysrhythmia    SVT ==ablation done by Dr. Rayann Heman 2007  . Esophageal reflux    takes Zantac daily  . Frequent headaches   . History of bronchitis Dec 2014  . Hypertension    takes Losartan daily  . Hypothyroidism    takes Synthroid daily  . Joint pain   . Joint swelling     left ankle  . Low sodium syndrome   . Lumbar radiculopathy   . Seizures ([place]) 04-05-13   one time ran out of Primidone and had stopped it cold [place]  . SVT (supraventricular tachycardia) ([place])   . Tremor, essential    takes Mysoline and Neurontin daily.  . Vocal cord dysfunction   . Weakness    in left arm    Patient Active Problem List   Diagnosis Date Noted  . Uncomplicated asthma 71/09/2692  . Skin erythema 09/28/2018  . Umbilical pain 85/46/2703  . Constipation 03/18/2017  . CAP (community acquired pneumonia) 04/21/2016  . History of colonic polyps   . Diverticulosis of colon without hemorrhage   . Other hemorrhoids   . [name]'s ring   . Mucosal abnormality of stomach   . Dysphagia   . Encounter for screening colonoscopy 02/21/2015  . Upper airway cough syndrome 01/18/2015  . OAB (overactive bladder) 12/30/2014  . Incontinence in female 12/25/2014  . 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 Thrush of mouth and esophagus ([place]) 11/23/2014  . Depression with somatization 11/05/2014  . Type II diabetes mellitus with manifestations ([place]) 10/23/2014  . Visit for screening mammogram 08/21/2014  . Hypokalemia 05/30/2014  . Tinea corporis 05/30/2014  . Lumbar scoliosis 04/24/2014  . Obstructive sleep apnea 04/23/2014  . Moderate  persistent asthma with acute bronchitis and acute exacerbation 03/02/2014  . Allergy to angiotensin receptor blockers (ARB) 01/22/2014  . S/P deep brain stimulator placement 01/10/2014  . Greater trochanteric bursitis of left hip 11/07/2013  . Spinal stenosis of lumbar region 10/30/2013  . Right thyroid nodule 10/12/2013  . CMC arthritis, thumb, degenerative 06/15/2013  . GERD (gastroesophageal reflux disease) 11/03/2012  . IBS (irritable bowel syndrome) 11/03/2012  . Esophageal stenosis 11/01/2012  . Asthma, moderate persistent, poorly-controlled 09/01/2012  . Hypothyroidism 12/22/2011  .  Hyperlipidemia with target LDL less than 100 06/22/2008  . Essential hypertension 09/04/2007  . TREMOR, ESSENTIAL 04/19/2007  . Seasonal and perennial allergic rhinitis 04/19/2007    Past Surgical History:  Procedure Laterality Date  . ABDOMINAL HYSTERECTOMY    . ABLATION FOR AVNRT    . APPENDECTOMY    . BARTHOLIN GLAND CYST EXCISION     x 2  . BIOPSY  03/04/2015   Procedure: BIOPSY (Duodenal, Gastric);  Surgeon: Daneil Dolin, MD;  Location: [facility];  Service: Endoscopy;;  . BIOPSY  12/19/2018   Procedure: BIOPSY;  Surgeon: Daneil Dolin, MD;  Location: [facility];  Service: Endoscopy;;  Gastric Biopsies    . BREAST SURGERY    . CESAREAN SECTION    . CHOLECYSTECTOMY    . COLONOSCOPY  01/16/2004   EQA:STMHDQ rectum/colon  . COLONOSCOPY WITH PROPOFOL N/A 03/04/2015   Dr.[name]- internal hemorrhoids, pancolonic diverticulosis, colonic polyp= tubular adenoma  . DEEP BRAIN STIMULATOR PLACEMENT    . DENTAL SURGERY    . ESOPHAGEAL DILATION N/A 03/04/2015   Procedure: ESOPHAGEAL DILATION WITH 54FR MALONEY DILATOR;  Surgeon: Daneil Dolin, MD;  Location: [facility];  Service: Endoscopy;  Laterality: N/A;  . ESOPHAGEAL DILATION  12/19/2018   Procedure: ESOPHAGEAL DILATION;  Surgeon: Daneil Dolin, MD;  [facility]: [facility];  Service: Endoscopy;;  . ESOPHAGOGASTRODUODENOSCOPY (EGD) WITH ESOPHAGEAL DILATION  04/03/2002   QIW:LNLGXQJJ'H ring, otherwise normal esophagus, status post dilation with 56 F/Normal stomach  . ESOPHAGOGASTRODUODENOSCOPY (EGD) WITH ESOPHAGEAL DILATION N/A 11/08/2012   Dr. [name]:schatzki's ring s/p dilation/hiatal hernia  . ESOPHAGOGASTRODUODENOSCOPY (EGD) WITH PROPOFOL N/A 03/04/2015   Dr.[name]- schatzki's ring, hiatal hernia, scattered erosions bx= chronic inactive gastritis  . ESOPHAGOGASTRODUODENOSCOPY (EGD) WITH PROPOFOL N/A 05/06/2017   Normal esophagus. Dilated. Small hiatal hernia. Normal duodenal bulb and second portion of duodenum.  .  ESOPHAGOGASTRODUODENOSCOPY (EGD) WITH PROPOFOL N/A 12/19/2018   Procedure: ESOPHAGOGASTRODUODENOSCOPY (EGD) WITH PROPOFOL;  Surgeon: Daneil Dolin, MD;  [facility];  Service: Endoscopy;  Laterality: N/A;  11:15am  . FINGER SURGERY    . HEMORRHOID BANDING  2017   Dr.[name]  . LEFT ANKLE LIGAMENT REPAIR     x 2  . LEFT ELBOW REPAIR    . MALONEY DILATION N/A 05/06/2017   Procedure: Venia Minks DILATION;  Surgeon: Daneil Dolin, MD;  [facility];  Service: Endoscopy;  Laterality: N/A;  . Venia Minks DILATION N/A 12/19/2018   Procedure: Venia Minks DILATION;  Surgeon: Daneil Dolin, MD;  [facility];  Service: Endoscopy;  Laterality: N/A;  . MAXIMUM ACCESS (MAS)POSTERIOR LUMBAR INTERBODY FUSION (PLIF) 1 LEVEL N/A 04/24/2014   Procedure: Lumbar four-five Maximum access Surgery posterior lumbar interbody fusion;  Surgeon: [name], MD;  Location: [facility];  Service: Neurosurgery;  Laterality: N/A;  Lumbar four-five Maximum access Surgery posterior lumbar interbody fusion  . MULTIPLE EXTRACTIONS WITH ALVEOLOPLASTY N/A 06/18/2017   Procedure: MULTIPLE EXTRACTION;  Surgeon: Diona Browner, DDS;  Location: [facility];  Service: Oral Surgery;  Laterality: N/A;  . PARTIAL HYSTERECTOMY    . POLYPECTOMY  03/04/2015   Procedure: POLYPECTOMY (descending colon);  Surgeon: Daneil Dolin, MD;  Location: [facility];  Service: Endoscopy;;  . PULSE GENERATOR IMPLANT Bilateral 12/15/2013   Procedure: BILATERAL PULSE GENERATOR IMPLANT;  Surgeon: [name], MD;  Location: [facility] [facility];  Service: Neurosurgery;  Laterality: Bilateral;  BILATERAL  . RIGHT BREAST CYST     benign  . SUBTHALAMIC STIMULATOR BATTERY REPLACEMENT Bilateral 06/24/2018   Procedure: Bilateral implantable pulse generator battery change;  Surgeon: [name], MD;  Location: [facility];  Service: Neurosurgery;  Laterality: Bilateral;  bilateral  . SUBTHALAMIC STIMULATOR INSERTION Bilateral 12/04/2013   Procedure: Bilateral  Deep brain stimulator placement;  Surgeon: [name], MD;  Location: [place];  Service: Neurosurgery;  Laterality: Bilateral;  Bilateral Deep brain stimulator placement  . TONSILLECTOMY       OB History    Gravida  2   Para  2   Term  2   Preterm      AB      Living  1     SAB      TAB      Ectopic      Multiple      Live Births              Family History  Problem Relation Age of Onset  . Lung cancer Father        DIED AGE 41 LUNG CA  . Alcohol abuse Father   . Anxiety disorder Father   . Depression Father   . COPD Mother        DIED AGE 78,MRSA,COPD,PNEUMONIA  . Pneumonia Mother        DIED AGE 78,MRSA,COPD,PNEUMONIA  . Supraventricular tachycardia Mother   . Anxiety disorder Mother   . Depression Mother   . Alcohol abuse Mother   . Heart disease Sister        DIED AGE 73, SMOKER,?HEART  . COPD Brother        AGE 32  . Colon cancer Neg Hx     Social History   Tobacco Use  . Smoking status: Former Smoker    Packs/day: 0.30    Years: 10.00    Pack years: 3.00    Types: Cigarettes    Quit date: 04/23/1993    Years since quitting: 26.4  . Smokeless tobacco: Never Used  . Tobacco comment: quit smoking 25+yrs ago  Substance Use Topics  . Alcohol use: No    Alcohol/week: 0.0 standard drinks  . Drug use: No    Home Medications Prior to Admission medications   Medication Sig Start Date End Date Taking? Authorizing Provider  acetaminophen (TYLENOL) 500 MG tablet Take 500 mg by mouth every 4 (four) hours as needed for mild pain, fever or headache.     [provider]  albuterol (PROAIR HFA) 108 (90 Base) MCG/ACT inhaler Inhale 2 puffs every 6 hours for asthma - rescue inhaler Patient taking differently: Inhale 2 puffs into the lungs every 6 (six) hours as needed for shortness of breath.  05/28/17   Baird Lyons D, MD  albuterol (PROVENTIL) (2.5 MG/3ML) 0.083% nebulizer solution Take 3 mLs (2.5 mg total) by nebulization every 6  (six) hours as needed for wheezing or shortness of breath. 08/28/19   Baird Lyons D, MD  alum & mag hydroxide-simeth (MAALOX/MYLANTA) 200-200-20 MG/5ML suspension Take 30 mLs by  mouth as needed for indigestion or heartburn. Max 4 doses per 24 hours    [provider]  amoxicillin-clavulanate (AUGMENTIN) 875-125 MG tablet Take 1 tablet by mouth every 12 (twelve) hours. 08/27/19   Petrucelli, Glynda Jaeger, PA-C  Ascorbic Acid (VITAMIN C WITH ROSE HIPS) 500 MG tablet Take 500 mg by mouth in the morning.    [provider]  aspirin 81 MG chewable tablet Chew 81 mg by mouth in the morning.     [provider]  baclofen (LIORESAL) 10 MG tablet Take 10 mg by mouth at bedtime.    [provider]  Carboxymethylcellulose Sod PF (REFRESH PLUS) 0.5 % SOLN Place 1 drop into both eyes 2 (two) times daily.     [provider]  Cholecalciferol (VITAMIN D-3) 125 MCG (5000 UT) TABS Take 5,000 Units by mouth in the morning.     [provider]  dextromethorphan (DELSYM) 30 MG/5ML liquid Take 60 mg by mouth every 4 (four) hours as needed for cough.    [provider]  divalproex (DEPAKOTE) 500 MG DR tablet Take 500 mg by mouth 3 (three) times daily.     [provider]  docusate sodium (COLACE) 100 MG capsule Take 100 mg by mouth in the morning.     [provider]  ENULOSE 10 GM/15ML SOLN Take 10 g by mouth daily as needed (for mild constipation).  08/22/18   [provider]  fluticasone furoate-vilanterol (BREO ELLIPTA) 100-25 MCG/INH AEPB Inhale 1 puff into the lungs daily. Patient taking differently: Inhale 1 puff into the lungs daily. RINSE MOUTH WITH WATER AFTER USE 12/17/16   Baird Lyons D, MD  gabapentin (NEURONTIN) 300 MG capsule Take 300-600 mg by mouth See admin instructions. Take 300 mg by mouth in the morning and 600 mg at bedtime    [provider]  guaifenesin (ROBITUSSIN) 100 MG/5ML syrup Take 200 mg by  mouth every 6 (six) hours as needed for cough.     [provider]  HYDROcodone-acetaminophen (NORCO/VICODIN) 5-325 MG tablet Take 1 tablet by mouth 3 (three) times daily.    [provider]  hydrocortisone 1 % ointment Apply 1 application topically 2 (two) times daily as needed ((hemorrhoidal flare)).     [provider]  insulin glargine (LANTUS) 100 UNIT/ML injection Inject 15 Units into the skin at bedtime. *Hold if blood glucose is less than 160*    [provider]  ipratropium (ATROVENT) 0.03 % nasal spray 1-2 puffs each nostril 3 times daily, before meals Patient taking differently: Place 1 spray into both nostrils 3 (three) times daily before meals.  01/12/17   Deneise Lever, MD  levothyroxine (SYNTHROID, LEVOTHROID) 75 MCG tablet TAKE (1) TABLET BY MOUTH ONCE DAILY BEFORE BREAKFAST. Patient taking differently: Take 75 mcg by mouth daily before breakfast.  01/12/15   Janith Lima, MD  loperamide (IMODIUM A-D) 2 MG tablet Take 2 mg by mouth 4 (four) times daily as needed for diarrhea or loose stools.     [provider]  lubiprostone (AMITIZA) 8 MCG capsule Take 1 capsule (8 mcg total) by mouth 2 (two) times daily with a meal. Patient taking differently: Take 8 mcg by mouth 2 (two) times daily as needed for constipation.  06/27/19   Carlis Stable, NP  magnesium hydroxide (MILK OF MAGNESIA) 400 MG/5ML suspension Take 30 mLs by mouth at bedtime as needed for mild constipation.     [provider]  Menthol,  Topical Analgesic, (BIOFREEZE) 4 % GEL Apply 1 application topically 2 (two) times daily. Apply to lower back and base of right thumb twice daily.    [provider]  metoprolol succinate (TOPROL-XL) 50 MG 24 hr tablet Take 50 mg by mouth in the morning. Take with or immediately following a meal.     [provider]  Multiple Vitamins-Minerals (MULTIVITAMINS THER. W/MINERALS) TABS tablet TAKE ONE TABLET BY MOUTH ONCE  DAILY. Patient taking differently: Take 1 tablet by mouth in the morning.  04/16/15   Janith Lima, MD  neomycin-bacitracin-polymyxin (NEOSPORIN) ointment Apply 1 application topically as needed for wound care.    [provider]  NOVOLOG FLEXPEN 100 UNIT/ML FlexPen Inject 8 Units into the skin 3 (three) times daily before meals.  07/01/15   [provider]  Omega 3 1000 MG CAPS TAKE (1) CAPSULE BY MOUTH ONCE DAILY. Patient taking differently: Take 1,000 mg by mouth in the morning.  04/16/15   Janith Lima, MD  pantoprazole (PROTONIX) 40 MG tablet Take 40 mg by mouth daily before breakfast.     [provider]  PARoxetine (PAXIL) 10 MG tablet Take 10 mg by mouth daily.  10/07/18   [provider]  Polyethyl Glycol-Propyl Glycol (SYSTANE ULTRA) 0.4-0.3 % SOLN Place 1 drop into both eyes 4 (four) times daily as needed (dry eyes).    [provider]  polyethylene glycol (MIRALAX / GLYCOLAX) packet Take 17 g by mouth daily as needed for moderate constipation. Patient taking differently: Take 17 g by mouth 2 (two) times daily as needed for moderate constipation.  10/01/17   Orpah Greek, MD  potassium chloride SA (K-DUR,KLOR-CON) 20 MEQ tablet Take 20 mEq by mouth daily.    [provider]  predniSONE (STERAPRED UNI-PAK 21 TAB) 10 MG (21) TBPK tablet Take by mouth daily. 6, 5, 4, 3, 2, 1 take as written 08/27/19   Petrucelli, Samantha R, PA-C  primidone (MYSOLINE) 50 MG tablet Take 50 mg by mouth in the morning.     [provider]  QUEtiapine (SEROQUEL) 100 MG tablet Take 100 mg by mouth at bedtime.    [provider]  simvastatin (ZOCOR) 5 MG tablet Take 5 mg by mouth at bedtime.  08/21/17   [provider]  sitaGLIPtin (JANUVIA) 100 MG tablet Take 100 mg by mouth daily.    [provider]  sodium chloride (OCEAN) 0.65 % SOLN nasal spray Take 1-2 sprays in nostril every hour as needed for rhinitis Patient  taking differently: Place 1 spray into both nostrils every hour as needed for congestion.  06/08/17   Baird Lyons D, MD  sodium chloride 1 g tablet Take 1 g by mouth 2 (two) times daily.    [provider]  Valbenazine Tosylate (INGREZZA) 40 MG CAPS Take 40 mg by mouth at bedtime.    [provider]  zinc gluconate 50 MG tablet Take 50 mg by mouth daily.    [provider]    Allergies    Invokana [canagliflozin], Losartan, Metformin and related, Latex, Other, Oxycodone-acetaminophen, and Mucinex [guaifenesin er]  Review of Systems   Review of Systems  Unable to perform ROS: Mental status change    Physical Exam Updated Vital Signs BP 105/86   Pulse 78   Temp 97.8 F (36.6 C)   Resp 16   Ht 5' 4.5" (1.638 m)   Wt 87.5 kg   SpO2 97%   BMI 32.60 kg/m  Physical Exam Vitals and nursing note reviewed.   69 year old female, resting comfortably and in no acute distress. Vital signs are normal. Oxygen saturation is 97%, which is normal. Head is normocephalic and atraumatic.  Pupils are 3 mm and reactive, unable to test extraocular movements. Oropharynx is clear. Neck is nontender and supple without adenopathy or JVD. Back is nontender and there is no CVA tenderness. Lungs have scattered inspiratory and expiratory wheezes without rales or rhonchi. Chest is nontender. Heart has regular rate and rhythm without murmur. Abdomen is soft, flat, nontender without masses or hepatosplenomegaly and peristalsis is normoactive. Extremities have 2+ edema.  There is mild erythema and warmth of the right lower leg. Skin is warm and dry without rash. Neurologic: Responsive only to deep painful stimuli, speaks unintelligibly when aroused, cranial nerves are grossly intact.  She moves all extremities equally.  ED Results / Procedures / Treatments   Labs (all labs ordered are listed, but only abnormal results are displayed) Labs Reviewed  COMPREHENSIVE METABOLIC PANEL -  Abnormal; Notable for the following components:      Result Value   Sodium 131 (*)    Chloride 96 (*)    Glucose, Bld 157 (*)    Calcium 8.6 (*)    Total Protein 6.3 (*)    Albumin 3.3 (*)    All other components within normal limits  CBC WITH DIFFERENTIAL/PLATELET - Abnormal; Notable for the following components:   RBC 3.26 (*)    Hemoglobin 11.2 (*)    HCT 32.9 (*)    MCV 100.9 (*)    MCH 34.4 (*)    Platelets 100 (*)    All other components within normal limits  BLOOD GAS, ARTERIAL - Abnormal; Notable for the following components:   pO2, Arterial 73.8 (*)    Acid-Base Excess 3.2 (*)    All other components within normal limits  URINALYSIS, ROUTINE W REFLEX MICROSCOPIC - Abnormal; Notable for the following components:   Color, Urine AMBER (*)    Glucose, UA 50 (*)    Ketones, ur 20 (*)    All other components within normal limits  RAPID URINE DRUG SCREEN, HOSP PERFORMED - Abnormal; Notable for the following components:   Opiates POSITIVE (*)    Barbiturates POSITIVE (*)    All other components within normal limits  CBG MONITORING, ED - Abnormal; Notable for the following components:   Glucose-Capillary 148 (*)    All other components within normal limits  ETHANOL  LACTIC ACID, PLASMA  AMMONIA  VALPROIC ACID LEVEL    Radiology CT Head Wo Contrast  Result Date: 09/11/2019 CLINICAL DATA:  Altered mental status EXAM: CT HEAD WITHOUT CONTRAST TECHNIQUE: Contiguous axial images were obtained from the base of the skull through the vertex without intravenous contrast. COMPARISON:  CT 09/28/2014 FINDINGS: Brain: No evidence of acute infarction, hemorrhage, hydrocephalus, extra-axial collection or mass lesion/mass effect. Symmetric prominence of the ventricles, cisterns and sulci compatible with parenchymal volume loss. Patchy areas of white matter hypoattenuation are most compatible with chronic microvascular angiopathy. Vascular: Atherosclerotic calcification of the carotid  siphons. No hyperdense vessel. Skull: No calvarial fracture or suspicious osseous lesion. Small burr holes towards the vertex for stimulator leads. Coiled leads along the scalp. No scalp swelling or hematoma. Sinuses/Orbits: Paranasal sinuses and mastoid air cells are predominantly clear. Included orbital structures are unremarkable. Other: Bilateral deep brain stimulator leads terminate in stable position within the subthalamic region bilaterally. Resulting streak artifact. No evidence of lead discontinuity  along the intracranial or extracranial course. IMPRESSION: 1. No acute intracranial findings. 2. Stable parenchymal volume loss and chronic microvascular angiopathy. 3. Bilateral deep brain stimulator leads terminate in stable position within the subthalamic region bilaterally. No evidence of lead discontinuity along the intracranial or extracranial course. Electronically Signed   By: Lovena Le M.D.   On: 09/11/2019 00:09   DG Chest Port 1 View  Result Date: 09/10/2019 CLINICAL DATA:  Altered mental status EXAM: PORTABLE CHEST 1 VIEW COMPARISON:  Radiograph 08/27/2019 FINDINGS: Bilateral neurostimulator generators project over the left and right chest walls without lead discontinuity. Telemetry leads overlie the chest. New opacity is seen in the periphery of the left lung base. No other focal consolidation. Low volumes with atelectasis. No pneumothorax or effusion. No frank edema. Cardiomediastinal contours are similar to prior counting for differences in technique with a calcified aorta. No acute osseous or soft tissue abnormality. IMPRESSION: New opacity in the periphery of the left lung base, concerning for pneumonia or aspiration in the appropriate clinical setting. Electronically Signed   By: Lovena Le M.D.   On: 09/10/2019 23:51    Procedures Procedures  CRITICAL CARE Performed by: Delora Fuel Total critical care time: 40 minutes Critical care time was exclusive of separately billable  procedures and treating other patients. Critical care was necessary to treat or prevent imminent or life-threatening deterioration. Critical care was time spent personally by me on the following activities: development of treatment plan with patient and/or surrogate as well as nursing, discussions with consultants, evaluation of patient's response to treatment, examination of patient, obtaining history from patient or surrogate, ordering and performing treatments and interventions, ordering and review of laboratory studies, ordering and review of radiographic studies, pulse oximetry and re-evaluation of patient's condition.  Medications Ordered in ED Medications  cefTRIAXone (ROCEPHIN) 2 g in sodium chloride 0.9 % 100 mL IVPB (2 g Intravenous New Bag/Given 09/11/19 0052)  azithromycin (ZITHROMAX) 500 mg in sodium chloride 0.9 % 250 mL IVPB (has no administration in time range)    ED Course  I have reviewed the triage vital signs and the nursing notes.  Pertinent labs & imaging results that were available during my care of the patient were reviewed by me and considered in my medical decision making (see chart for details).  MDM Rules/Calculators/A&P Altered mental status of uncertain cause.  In patient with history of COPD, need to consider acute hypercarbic respiratory failure.  Need to consider medication side effect/overdose.  Will also screen for sepsis and send for CT of head to rule out occult intracranial injury.  Old records are reviewed, and she does have previous ED visits for COPD and for falls.  There is 1 arterial blood gas on record from December 2014, and she had a normal PCO2 at that time.  Chest x-ray does appear to show a faint left lower lobe infiltrate.  Urinalysis does not show any evidence of infection.  CT of the head shows no acute process.  Lactate is normal.  WBC is normal with normal differential.  Mild anemia is present which is new, thrombocytopenia is present and  unchanged from recent values.  ABG shows normal PCO2 and pH.  Mild hyponatremia is present, not felt to be clinically significant.  Ammonia level is normal.  Valproic acid level is therapeutic.  Drug screen is positive for opiates and barbiturates, consistent with her medication history.  At this point, mental status changes felt to be secondary to infection and possible medication effects.  Case is discussed with Dr. Humphrey Rolls of [facility], who agrees to admit the patient.  Final Clinical Impression(s) / ED Diagnoses Final diagnoses:  Altered mental status, unspecified altered mental status type  Community acquired pneumonia of left lower lobe of lung  Macrocytic anemia  Hyponatremia  Thrombocytopenia ([place])    Rx / DC Orders ED Discharge Orders    None       Delora Fuel, MD 85/92/76 [id number]

## 2019-09-10 NOTE — ED Notes (Signed)
Patient was on room air when abg was obtained not 37 percent oxygen. Lab result is in correct.

## 2019-09-10 NOTE — ED Notes (Signed)
Per Lenna Sciara, RN at [facility], pt has been acting "like she is drunk" since last night. RN stated that pt seemed more altered than normal since last night and did not return to normal during the day.

## 2019-09-11 ENCOUNTER — Other Ambulatory Visit: Payer: Self-pay

## 2019-09-11 DIAGNOSIS — D696 Thrombocytopenia, unspecified: Secondary | ICD-10-CM | POA: Diagnosis present

## 2019-09-11 DIAGNOSIS — G894 Chronic pain syndrome: Secondary | ICD-10-CM | POA: Diagnosis present

## 2019-09-11 DIAGNOSIS — R569 Unspecified convulsions: Secondary | ICD-10-CM | POA: Diagnosis present

## 2019-09-11 DIAGNOSIS — K219 Gastro-esophageal reflux disease without esophagitis: Secondary | ICD-10-CM | POA: Diagnosis present

## 2019-09-11 DIAGNOSIS — U071 COVID-19: Secondary | ICD-10-CM | POA: Diagnosis present

## 2019-09-11 DIAGNOSIS — R4182 Altered mental status, unspecified: Secondary | ICD-10-CM | POA: Diagnosis not present

## 2019-09-11 DIAGNOSIS — I10 Essential (primary) hypertension: Secondary | ICD-10-CM | POA: Diagnosis present

## 2019-09-11 DIAGNOSIS — E039 Hypothyroidism, unspecified: Secondary | ICD-10-CM | POA: Diagnosis present

## 2019-09-11 DIAGNOSIS — E1165 Type 2 diabetes mellitus with hyperglycemia: Secondary | ICD-10-CM | POA: Diagnosis present

## 2019-09-11 DIAGNOSIS — J309 Allergic rhinitis, unspecified: Secondary | ICD-10-CM | POA: Diagnosis present

## 2019-09-11 DIAGNOSIS — J9601 Acute respiratory failure with hypoxia: Secondary | ICD-10-CM | POA: Diagnosis present

## 2019-09-11 DIAGNOSIS — R339 Retention of urine, unspecified: Secondary | ICD-10-CM | POA: Diagnosis not present

## 2019-09-11 DIAGNOSIS — I959 Hypotension, unspecified: Secondary | ICD-10-CM | POA: Diagnosis present

## 2019-09-11 DIAGNOSIS — E785 Hyperlipidemia, unspecified: Secondary | ICD-10-CM | POA: Diagnosis present

## 2019-09-11 DIAGNOSIS — E871 Hypo-osmolality and hyponatremia: Secondary | ICD-10-CM | POA: Diagnosis present

## 2019-09-11 DIAGNOSIS — G92 Toxic encephalopathy: Secondary | ICD-10-CM | POA: Diagnosis present

## 2019-09-11 DIAGNOSIS — T402X1A Poisoning by other opioids, accidental (unintentional), initial encounter: Secondary | ICD-10-CM | POA: Diagnosis present

## 2019-09-11 DIAGNOSIS — J1282 Pneumonia due to coronavirus disease 2019: Secondary | ICD-10-CM | POA: Diagnosis present

## 2019-09-11 DIAGNOSIS — F419 Anxiety disorder, unspecified: Secondary | ICD-10-CM | POA: Diagnosis present

## 2019-09-11 DIAGNOSIS — F319 Bipolar disorder, unspecified: Secondary | ICD-10-CM | POA: Diagnosis present

## 2019-09-11 DIAGNOSIS — D539 Nutritional anemia, unspecified: Secondary | ICD-10-CM | POA: Diagnosis present

## 2019-09-11 DIAGNOSIS — G250 Essential tremor: Secondary | ICD-10-CM | POA: Diagnosis present

## 2019-09-11 DIAGNOSIS — J440 Chronic obstructive pulmonary disease with acute lower respiratory infection: Secondary | ICD-10-CM | POA: Diagnosis present

## 2019-09-11 DIAGNOSIS — T380X5A Adverse effect of glucocorticoids and synthetic analogues, initial encounter: Secondary | ICD-10-CM | POA: Diagnosis present

## 2019-09-11 DIAGNOSIS — E038 Other specified hypothyroidism: Secondary | ICD-10-CM | POA: Diagnosis not present

## 2019-09-11 DIAGNOSIS — M5116 Intervertebral disc disorders with radiculopathy, lumbar region: Secondary | ICD-10-CM | POA: Diagnosis present

## 2019-09-11 DIAGNOSIS — G2401 Drug induced subacute dyskinesia: Secondary | ICD-10-CM | POA: Diagnosis present

## 2019-09-11 LAB — CBC
HCT: 31.5 % — ABNORMAL LOW (ref 36.0–46.0)
Hemoglobin: 10.4 g/dL — ABNORMAL LOW (ref 12.0–15.0)
MCH: 33.9 pg (ref 26.0–34.0)
MCHC: 33 g/dL (ref 30.0–36.0)
MCV: 102.6 fL — ABNORMAL HIGH (ref 80.0–100.0)
Platelets: 87 10*3/uL — ABNORMAL LOW (ref 150–400)
RBC: 3.07 MIL/uL — ABNORMAL LOW (ref 3.87–5.11)
RDW: 13.2 % (ref 11.5–15.5)
WBC: 4.8 10*3/uL (ref 4.0–10.5)
nRBC: 0 % (ref 0.0–0.2)

## 2019-09-11 LAB — IRON AND TIBC
Iron: 67 ug/dL (ref 28–170)
Saturation Ratios: 28 % (ref 10.4–31.8)
TIBC: 243 ug/dL — ABNORMAL LOW (ref 250–450)
UIBC: 176 ug/dL

## 2019-09-11 LAB — ABO/RH: ABO/RH(D): O POS

## 2019-09-11 LAB — CBG MONITORING, ED: Glucose-Capillary: 165 mg/dL — ABNORMAL HIGH (ref 70–99)

## 2019-09-11 LAB — BASIC METABOLIC PANEL
Anion gap: 7 (ref 5–15)
BUN: 8 mg/dL (ref 8–23)
CO2: 27 mmol/L (ref 22–32)
Calcium: 8 mg/dL — ABNORMAL LOW (ref 8.9–10.3)
Chloride: 102 mmol/L (ref 98–111)
Creatinine, Ser: 0.59 mg/dL (ref 0.44–1.00)
GFR calc Af Amer: >60 mL/min (ref >60)
GFR calc non Af Amer: >60 mL/min (ref >60)
Glucose, Bld: 177 mg/dL — ABNORMAL HIGH (ref 70–99)
Potassium: 4.1 mmol/L (ref 3.5–5.1)
Sodium: 136 mmol/L (ref 135–145)

## 2019-09-11 LAB — SARS CORONAVIRUS 2 BY RT PCR (HOSPITAL ORDER, PERFORMED IN ~~LOC~~ HOSPITAL LAB): SARS Coronavirus 2: POSITIVE — AB

## 2019-09-11 LAB — RETICULOCYTES
Immature Retic Fract: 17.2 % — ABNORMAL HIGH (ref 2.3–15.9)
RBC.: 3.27 MIL/uL — ABNORMAL LOW (ref 3.87–5.11)
Retic Count, Absolute: 81.8 10*3/uL (ref 19.0–186.0)
Retic Ct Pct: 2.5 % (ref 0.4–3.1)

## 2019-09-11 LAB — HIV ANTIBODY (ROUTINE TESTING W REFLEX): HIV Screen 4th Generation wRfx: NONREACTIVE

## 2019-09-11 LAB — GLUCOSE, CAPILLARY
Glucose-Capillary: 234 mg/dL — ABNORMAL HIGH (ref 70–99)
Glucose-Capillary: 184 mg/dL — ABNORMAL HIGH (ref 70–99)

## 2019-09-11 LAB — FOLATE: Folate: 26 ng/mL (ref >5.9)

## 2019-09-11 LAB — FERRITIN
Ferritin: 98 ng/mL (ref 11–307)
Ferritin: 106 ng/mL (ref 11–307)

## 2019-09-11 LAB — C-REACTIVE PROTEIN: CRP: 4 mg/dL — ABNORMAL HIGH (ref <1.0)

## 2019-09-11 LAB — VITAMIN B12: Vitamin B-12: 316 pg/mL (ref 180–914)

## 2019-09-11 LAB — HEMOGLOBIN A1C
Hgb A1c MFr Bld: 7.4 % — ABNORMAL HIGH (ref 4.8–5.6)
Mean Plasma Glucose: 165.68 mg/dL

## 2019-09-11 LAB — D-DIMER, QUANTITATIVE: D-Dimer, Quant: 0.64 ug/mL-FEU — ABNORMAL HIGH (ref 0.00–0.50)

## 2019-09-11 LAB — PROCALCITONIN: Procalcitonin: <0.1 ng/mL

## 2019-09-11 MED ORDER — INSULIN ASPART 100 UNIT/ML ~~LOC~~ SOLN
0.0000 [IU] | Freq: Every day | SUBCUTANEOUS | Status: DC
  Administered 2019-09-12: 2 [IU] via SUBCUTANEOUS

## 2019-09-11 MED ORDER — PAROXETINE HCL 10 MG PO TABS
10.0000 mg | ORAL_TABLET | Freq: Every day | ORAL | Status: DC
  Administered 2019-09-11 – 2019-09-15 (×5): 10 mg via ORAL
  Filled 2019-09-11 (×5): qty 1

## 2019-09-11 MED ORDER — SODIUM CHLORIDE 1 G PO TABS
1.0000 g | ORAL_TABLET | Freq: Two times a day (BID) | ORAL | Status: DC
  Administered 2019-09-12 – 2019-09-15 (×7): 1 g via ORAL
  Filled 2019-09-11 (×8): qty 1

## 2019-09-11 MED ORDER — SODIUM CHLORIDE 0.9 % IV BOLUS
1000.0000 mL | Freq: Once | INTRAVENOUS | Status: AC
  Administered 2019-09-11: 1000 mL via INTRAVENOUS

## 2019-09-11 MED ORDER — SODIUM CHLORIDE 0.9 % IV SOLN
100.0000 mg | Freq: Every day | INTRAVENOUS | Status: AC
  Administered 2019-09-12 – 2019-09-15 (×4): 100 mg via INTRAVENOUS
  Filled 2019-09-11 (×5): qty 20

## 2019-09-11 MED ORDER — INSULIN ASPART 100 UNIT/ML ~~LOC~~ SOLN
0.0000 [IU] | Freq: Three times a day (TID) | SUBCUTANEOUS | Status: DC
  Administered 2019-09-11: 3 [IU] via SUBCUTANEOUS
  Administered 2019-09-11: 2 [IU] via SUBCUTANEOUS
  Administered 2019-09-12: 3 [IU] via SUBCUTANEOUS
  Administered 2019-09-12: 2 [IU] via SUBCUTANEOUS
  Administered 2019-09-12 – 2019-09-13 (×3): 3 [IU] via SUBCUTANEOUS
  Administered 2019-09-13: 2 [IU] via SUBCUTANEOUS
  Administered 2019-09-14: 5 [IU] via SUBCUTANEOUS
  Administered 2019-09-14: 2 [IU] via SUBCUTANEOUS
  Administered 2019-09-14: 3 [IU] via SUBCUTANEOUS
  Administered 2019-09-15: 2 [IU] via SUBCUTANEOUS
  Filled 2019-09-11: qty 1

## 2019-09-11 MED ORDER — GABAPENTIN 300 MG PO CAPS
300.0000 mg | ORAL_CAPSULE | Freq: Every morning | ORAL | Status: DC
  Administered 2019-09-11 – 2019-09-15 (×5): 300 mg via ORAL
  Filled 2019-09-11 (×4): qty 1

## 2019-09-11 MED ORDER — SODIUM CHLORIDE 0.9 % IV SOLN: 500.0000 mg | INTRAVENOUS | Status: DC

## 2019-09-11 MED ORDER — INSULIN DETEMIR 100 UNIT/ML ~~LOC~~ SOLN
0.1500 [IU]/kg | Freq: Two times a day (BID) | SUBCUTANEOUS | Status: DC
  Administered 2019-09-11 – 2019-09-15 (×9): 13 [IU] via SUBCUTANEOUS
  Filled 2019-09-11 (×13): qty 0.13

## 2019-09-11 MED ORDER — GUAIFENESIN-DM 100-10 MG/5ML PO SYRP: 10.0000 mL | ORAL_SOLUTION | ORAL | Status: DC | PRN

## 2019-09-11 MED ORDER — VITAMIN D3 25 MCG (1000 UNIT) PO TABS
5000.0000 [IU] | ORAL_TABLET | Freq: Every morning | ORAL | Status: DC
  Administered 2019-09-11 – 2019-09-15 (×5): 5000 [IU] via ORAL
  Filled 2019-09-11 (×8): qty 5

## 2019-09-11 MED ORDER — ASCORBIC ACID 500 MG PO TABS
500.0000 mg | ORAL_TABLET | Freq: Every day | ORAL | Status: DC
  Administered 2019-09-11 – 2019-09-15 (×5): 500 mg via ORAL
  Filled 2019-09-11 (×5): qty 1

## 2019-09-11 MED ORDER — SODIUM CHLORIDE 0.9 % IV SOLN
2.0000 g | Freq: Once | INTRAVENOUS | Status: AC
  Administered 2019-09-11: 2 g via INTRAVENOUS
  Filled 2019-09-11: qty 20

## 2019-09-11 MED ORDER — ASPIRIN 81 MG PO CHEW
81.0000 mg | CHEWABLE_TABLET | Freq: Every morning | ORAL | Status: DC
  Administered 2019-09-11 – 2019-09-15 (×5): 81 mg via ORAL
  Filled 2019-09-11 (×5): qty 1

## 2019-09-11 MED ORDER — ONDANSETRON HCL 4 MG/2ML IJ SOLN: 4.0000 mg | Freq: Four times a day (QID) | INTRAMUSCULAR | Status: DC | PRN

## 2019-09-11 MED ORDER — HYDROCOD POLST-CPM POLST ER 10-8 MG/5ML PO SUER: 5.0000 mL | Freq: Two times a day (BID) | ORAL | Status: DC | PRN

## 2019-09-11 MED ORDER — INSULIN ASPART 100 UNIT/ML ~~LOC~~ SOLN: 0.0000 [IU] | Freq: Every day | SUBCUTANEOUS | Status: DC

## 2019-09-11 MED ORDER — SIMVASTATIN 5 MG PO TABS
5.0000 mg | ORAL_TABLET | Freq: Every day | ORAL | Status: DC
  Administered 2019-09-11 – 2019-09-14 (×4): 5 mg via ORAL
  Filled 2019-09-11 (×6): qty 1

## 2019-09-11 MED ORDER — LEVOTHYROXINE SODIUM 75 MCG PO TABS
75.0000 ug | ORAL_TABLET | Freq: Every day | ORAL | Status: DC
  Administered 2019-09-12 – 2019-09-15 (×4): 75 ug via ORAL
  Filled 2019-09-11 (×4): qty 1

## 2019-09-11 MED ORDER — PANTOPRAZOLE SODIUM 40 MG PO TBEC
40.0000 mg | DELAYED_RELEASE_TABLET | Freq: Every day | ORAL | Status: DC
  Administered 2019-09-12 – 2019-09-15 (×4): 40 mg via ORAL
  Filled 2019-09-11 (×4): qty 1

## 2019-09-11 MED ORDER — NALOXONE HCL 2 MG/2ML IJ SOSY
PREFILLED_SYRINGE | INTRAMUSCULAR | Status: AC
  Administered 2019-09-11: 2 mg
  Filled 2019-09-11: qty 2

## 2019-09-11 MED ORDER — DIVALPROEX SODIUM 250 MG PO DR TAB
500.0000 mg | DELAYED_RELEASE_TABLET | Freq: Three times a day (TID) | ORAL | Status: DC
  Administered 2019-09-11 – 2019-09-15 (×13): 500 mg via ORAL
  Filled 2019-09-11 (×13): qty 2

## 2019-09-11 MED ORDER — LINAGLIPTIN 5 MG PO TABS
5.0000 mg | ORAL_TABLET | Freq: Every day | ORAL | Status: DC
  Administered 2019-09-11 – 2019-09-15 (×5): 5 mg via ORAL
  Filled 2019-09-11 (×7): qty 1

## 2019-09-11 MED ORDER — NALOXONE HCL 0.4 MG/ML IJ SOLN
2.0000 mg | Freq: Once | INTRAMUSCULAR | Status: AC
  Administered 2019-09-11: 2 mg via INTRAVENOUS
  Filled 2019-09-11: qty 5

## 2019-09-11 MED ORDER — SODIUM CHLORIDE 0.9 % IV SOLN
500.0000 mg | INTRAVENOUS | Status: DC
  Administered 2019-09-11: 500 mg via INTRAVENOUS
  Filled 2019-09-11: qty 500

## 2019-09-11 MED ORDER — INSULIN ASPART 100 UNIT/ML ~~LOC~~ SOLN: 0.0000 [IU] | Freq: Three times a day (TID) | SUBCUTANEOUS | Status: DC

## 2019-09-11 MED ORDER — METOPROLOL SUCCINATE ER 50 MG PO TB24
50.0000 mg | ORAL_TABLET | Freq: Every morning | ORAL | Status: DC
  Administered 2019-09-11 – 2019-09-15 (×5): 50 mg via ORAL
  Filled 2019-09-11 (×3): qty 1
  Filled 2019-09-11: qty 2
  Filled 2019-09-11: qty 1

## 2019-09-11 MED ORDER — ONDANSETRON HCL 4 MG PO TABS: 4.0000 mg | ORAL_TABLET | Freq: Four times a day (QID) | ORAL | Status: DC | PRN

## 2019-09-11 MED ORDER — VALBENAZINE TOSYLATE 40 MG PO CAPS
40.0000 mg | ORAL_CAPSULE | Freq: Every day | ORAL | Status: DC
  Administered 2019-09-12 – 2019-09-14 (×3): 40 mg via ORAL
  Filled 2019-09-11 (×5): qty 1

## 2019-09-11 MED ORDER — SODIUM CHLORIDE 0.9 % IV SOLN: 1.0000 g | INTRAVENOUS | Status: DC

## 2019-09-11 MED ORDER — SODIUM CHLORIDE 0.9 % IV SOLN
200.0000 mg | Freq: Once | INTRAVENOUS | Status: AC
  Administered 2019-09-11: 200 mg via INTRAVENOUS
  Filled 2019-09-11: qty 40

## 2019-09-11 MED ORDER — METHYLPREDNISOLONE SODIUM SUCC 125 MG IJ SOLR
0.5000 mg/kg | Freq: Two times a day (BID) | INTRAMUSCULAR | Status: DC
  Administered 2019-09-11 – 2019-09-13 (×5): 43.75 mg via INTRAVENOUS
  Filled 2019-09-11 (×5): qty 2

## 2019-09-11 MED ORDER — ALBUTEROL SULFATE HFA 108 (90 BASE) MCG/ACT IN AERS
1.0000 | INHALATION_SPRAY | RESPIRATORY_TRACT | Status: DC | PRN
  Filled 2019-09-11: qty 6.7

## 2019-09-11 MED ORDER — ZINC SULFATE 220 (50 ZN) MG PO CAPS
220.0000 mg | ORAL_CAPSULE | Freq: Every day | ORAL | Status: DC
  Administered 2019-09-11 – 2019-09-15 (×5): 220 mg via ORAL
  Filled 2019-09-11 (×5): qty 1

## 2019-09-11 MED ORDER — ENOXAPARIN SODIUM 40 MG/0.4ML ~~LOC~~ SOLN
40.0000 mg | Freq: Every day | SUBCUTANEOUS | Status: DC
  Administered 2019-09-11: 40 mg via SUBCUTANEOUS
  Filled 2019-09-11: qty 0.4

## 2019-09-11 MED ORDER — QUETIAPINE FUMARATE 25 MG PO TABS
100.0000 mg | ORAL_TABLET | Freq: Every day | ORAL | Status: DC
  Administered 2019-09-11 – 2019-09-14 (×4): 100 mg via ORAL
  Filled 2019-09-11 (×4): qty 4

## 2019-09-11 MED ORDER — GABAPENTIN 300 MG PO CAPS
600.0000 mg | ORAL_CAPSULE | Freq: Every day | ORAL | Status: DC
  Administered 2019-09-11 – 2019-09-14 (×4): 600 mg via ORAL
  Filled 2019-09-11 (×5): qty 2

## 2019-09-11 MED ORDER — ZINC GLUCONATE 50 MG PO TABS: 50.0000 mg | ORAL_TABLET | Freq: Every day | ORAL | Status: DC

## 2019-09-11 MED ORDER — PRIMIDONE 50 MG PO TABS
50.0000 mg | ORAL_TABLET | Freq: Every morning | ORAL | Status: DC
  Administered 2019-09-11 – 2019-09-15 (×5): 50 mg via ORAL
  Filled 2019-09-11 (×5): qty 1

## 2019-09-11 NOTE — H&P (Addendum)
History and Physical    [name] DOB: 09-07-1950 DOA: 09/10/2019  PCP: Abran Richard, MD (Confirm with patient/family/NH records and if not entered, this has to be entered at [facility] point of entry) Patient coming from: SNF  I have personally briefly reviewed patient's old medical records in [place]  Chief Complaint: Altered mental status  HPI: [name] is a 69 y.o. female with medical history significant of hypertension, hyperlipidemia, diabetes mellitus, COPD, seizure disorder and depression is brought by EMS from skilled nursing facility for decreased responsiveness.  During my evaluation patient is deeply asleep and difficult to arouse therefore history is gathered from ED physician and medical records.  According to the ED nurse it was reported by nurse from [place] that patient has been acting like she is drunk since last night and more altered than her baseline and did not return to her normal baseline during the day  It usually happens after she takes her narcotics.  It was also reported by the staff at skilled nursing facility that patient refused her narcotics and Lantus during the night and they think that patient is hoarding some medications in her room.  Patient is on hydrocodone-acetaminophen 5-325 3 times a day and she is also on divalproex, gabapentin, paroxetine, baclofen and quetiapine.  According to the ED physician patient is living in a skilled nursing facility because of altered mental status and multiple psychiatric problems.   ED Course: On arrival to the ED patient had blood pressure of 105 486, heart rate 78, respiratory rate 16, temperature 97.8 and oxygen saturation was 97% on room air in the ED dropped suddenly to 84% while in the ED and patient started on oxygen supplementation with nasal cannula.  Blood work showed WBC 5.6, hemoglobin 11.2, sodium 131, potassium 4, BUN 10, creatinine 0.6, blood glucose 157.  Ammonia level  was 12 while lactic acid 0.9, valproate level is 95 UA negative for UTI, alcohol level less than 10 and UDS positive for opiates and barbiturates.  Chest x-ray showed stable infiltrates in right and left lower lobes may be due to aspiration or entity acquired pneumonia.  CT head negative for acute intracranial bleed or pathology.  Patient was given IV ceftriaxone and IV azithromycin in the ED.                                         During my evaluation in the ED, patient was deeply sleeping and was difficult to arouse with verbal and chest rubbing.  Pupils were pinpoint.  I ordered a dose of Narcan in the ED and patient started opening her eyes on rubbing her chest but still going back to sleep in few seconds.  Review of Systems: As per HPI otherwise 10 point review of systems negative.  Unacceptable ROS statements: "10 systems reviewed," "Extensive" (without elaboration).  Acceptable ROS statements: "All others negative," "All others reviewed and are negative," and "All others unremarkable," with at [place] documented Can't double dip - if using for HPI can't use for ROS  Past Medical History:  Diagnosis Date  . Allergic rhinitis    uses Nasonex daily as needed  . Allergy to angiotensin receptor blockers (ARB) 01/22/2014   Angioedema  . Anxiety    takes Ativan daily as needed  . Aspiration pneumonia ([place])   . Asthma    Albuterol inhaler  and Neb daily as needed  . 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 [name], residual palpitations  . Bipolar 1 disorder ([place])   . Chronic back pain    reason unknown  . Constipation   . COPD (chronic obstructive pulmonary disease) ([place])   . DDD (degenerative disc disease), lumbar   . Depression    takes Zoloft daily as well as Cymbalta  . Diabetes mellitus    Type II  . Dysphagia   . Dysrhythmia    SVT ==ablation done by Dr. Rayann Heman 2007  . Esophageal reflux    takes Zantac daily  . Frequent  headaches   . History of bronchitis Dec 2014  . Hypertension    takes Losartan daily  . Hypothyroidism    takes Synthroid daily  . Joint pain   . Joint swelling    left ankle  . Low sodium syndrome   . Lumbar radiculopathy   . Seizures ([place]) 04-05-13   one time ran out of Primidone and had stopped it cold [place]  . SVT (supraventricular tachycardia) ([place])   . Tremor, essential    takes Mysoline and Neurontin daily.  . Vocal cord dysfunction   . Weakness    in left arm    Past Surgical History:  Procedure Laterality Date  . ABDOMINAL HYSTERECTOMY    . ABLATION FOR AVNRT    . APPENDECTOMY    . BARTHOLIN GLAND CYST EXCISION     x 2  . BIOPSY  03/04/2015   Procedure: BIOPSY (Duodenal, Gastric);  Surgeon: Daneil Dolin, MD;  Location: [facility];  Service: Endoscopy;;  . BIOPSY  12/19/2018   Procedure: BIOPSY;  Surgeon: Daneil Dolin, MD;  [facility];  Service: Endoscopy;;  Gastric Biopsies    . BREAST SURGERY    . CESAREAN SECTION    . CHOLECYSTECTOMY    . COLONOSCOPY  01/16/2004   XBD:ZHGDJM rectum/colon  . COLONOSCOPY WITH PROPOFOL N/A 03/04/2015   Dr.[name]- internal hemorrhoids, pancolonic diverticulosis, colonic polyp= tubular adenoma  . DEEP BRAIN STIMULATOR PLACEMENT    . DENTAL SURGERY    . ESOPHAGEAL DILATION N/A 03/04/2015   Procedure: ESOPHAGEAL DILATION WITH 54FR MALONEY DILATOR;  Surgeon: Daneil Dolin, MD;  Location: [facility];  Service: Endoscopy;  Laterality: N/A;  . ESOPHAGEAL DILATION  12/19/2018   Procedure: ESOPHAGEAL DILATION;  Surgeon: Daneil Dolin, MD;  [facility];  Service: Endoscopy;;  . ESOPHAGOGASTRODUODENOSCOPY (EGD) WITH ESOPHAGEAL DILATION  04/03/2002   EQA:[id number]'[id number] ring, otherwise normal esophagus, status post dilation with 56 F/Normal stomach  . ESOPHAGOGASTRODUODENOSCOPY (EGD) WITH ESOPHAGEAL DILATION N/A 11/08/2012   Dr. [name]:schatzki's ring s/p dilation/hiatal hernia  . ESOPHAGOGASTRODUODENOSCOPY (EGD) WITH  PROPOFOL N/A 03/04/2015   Dr.[name]- schatzki's ring, hiatal hernia, scattered erosions bx= chronic inactive gastritis  . ESOPHAGOGASTRODUODENOSCOPY (EGD) WITH PROPOFOL N/A 05/06/2017   Normal esophagus. Dilated. Small hiatal hernia. Normal duodenal bulb and second portion of duodenum.  . ESOPHAGOGASTRODUODENOSCOPY (EGD) WITH PROPOFOL N/A 12/19/2018   Procedure: ESOPHAGOGASTRODUODENOSCOPY (EGD) WITH PROPOFOL;  Surgeon: Daneil Dolin, MD;  [facility];  Service: Endoscopy;  Laterality: N/A;  11:15am  . FINGER SURGERY    . HEMORRHOID BANDING  2017   Dr.[name]  . LEFT ANKLE LIGAMENT REPAIR     x 2  . LEFT ELBOW REPAIR    . MALONEY DILATION N/A 05/06/2017   Procedure: Venia Minks DILATION;  Surgeon: Daneil Dolin, MD;  [facility];  Service: Endoscopy;  Laterality: N/A;  . Venia Minks DILATION N/A 12/19/2018   Procedure: Venia Minks DILATION;  Surgeon: Daneil Dolin, MD;  [facility];  Service: Endoscopy;  Laterality: N/A;  . MAXIMUM ACCESS (MAS)POSTERIOR LUMBAR INTERBODY FUSION (PLIF) 1 LEVEL N/A 04/24/2014   Procedure: Lumbar four-five Maximum access Surgery posterior lumbar interbody fusion;  Surgeon: [name], MD;  Location: [place] [facility];  Service: Neurosurgery;  Laterality: N/A;  Lumbar four-five Maximum access Surgery posterior lumbar interbody fusion  . MULTIPLE EXTRACTIONS WITH ALVEOLOPLASTY N/A 06/18/2017   Procedure: MULTIPLE EXTRACTION;  Surgeon: Diona Browner, DDS;  Location: [place];  Service: Oral Surgery;  Laterality: N/A;  . PARTIAL HYSTERECTOMY    . POLYPECTOMY  03/04/2015   Procedure: POLYPECTOMY (descending colon);  Surgeon: Daneil Dolin, MD;  [facility];  Service: Endoscopy;;  . PULSE GENERATOR IMPLANT Bilateral 12/15/2013   Procedure: BILATERAL PULSE GENERATOR IMPLANT;  Surgeon: [name], MD;  Location: [place] [facility] OR[facility];  Service: Neurosurgery;  Laterality: Bilateral;  BILATERAL  . RIGHT BREAST CYST     benign  . SUBTHALAMIC STIMULATOR  BATTERY REPLACEMENT Bilateral 06/24/2018   Procedure: Bilateral implantable pulse generator battery change;  Surgeon: [name], MD;  Location: [place];  Service: Neurosurgery;  Laterality: Bilateral;  bilateral  . SUBTHALAMIC STIMULATOR INSERTION Bilateral 12/04/2013   Procedure: Bilateral Deep brain stimulator placement;  Surgeon: [name], MD;  Location: [place] [facility] OR[facility];  Service: Neurosurgery;  Laterality: Bilateral;  Bilateral Deep brain stimulator placement  . TONSILLECTOMY       reports that she quit smoking about 26 years ago. Her smoking use included cigarettes. She has a 3.00 pack-year smoking history. She has never used smokeless tobacco. She reports that she does not drink alcohol or use drugs.  Allergies  Allergen Reactions  . Invokana [Canagliflozin] Other (See Comments)    Yeast infectios  . Losartan Other (See Comments)    Angioedema   . Metformin And Related Diarrhea  . Latex Other (See Comments)    Other Reaction: redness, blisters  . Other Other (See Comments)    Air fresheners - on MAR  . Oxycodone-Acetaminophen Itching and Other (See Comments)    TYLOX. Caused internal itching per patient   . Mucinex [Guaifenesin Er] Other (See Comments)    MUCINEX REACTION: asthma attacks    Family History  Problem Relation Age of Onset  . Lung cancer Father        DIED AGE 15 LUNG CA  . Alcohol abuse Father   . Anxiety disorder Father   . Depression Father   . COPD Mother        DIED AGE 50,MRSA,COPD,PNEUMONIA  . Pneumonia Mother        DIED AGE 50,MRSA,COPD,PNEUMONIA  . Supraventricular tachycardia Mother   . Anxiety disorder Mother   . Depression Mother   . Alcohol abuse Mother   . Heart disease Sister        DIED AGE 5, SMOKER,?HEART  . COPD Brother        AGE 81  . Colon cancer Neg Hx     Unacceptable: Noncontributory, unremarkable, or negative. Acceptable: (example)Family history negative for heart disease  Prior to Admission medications   Medication  Sig Start Date End Date Taking? Authorizing Provider  acetaminophen (TYLENOL) 500 MG tablet Take 500 mg by mouth every 4 (four) hours as needed for mild pain, fever or headache.     [provider]  albuterol [facility]  HFA) 108 (90 Base) MCG/ACT inhaler Inhale 2 puffs every 6 hours for asthma - rescue inhaler Patient taking differently: Inhale 2 puffs into the lungs every 6 (six) hours as needed for shortness of breath.  05/28/17   Baird Lyons D, MD  albuterol (PROVENTIL) (2.5 MG/3ML) 0.083% nebulizer solution Take 3 mLs (2.5 mg total) by nebulization every 6 (six) hours as needed for wheezing or shortness of breath. 08/28/19   Deneise Lever, MD  alum & mag hydroxide-simeth (MAALOX/MYLANTA) 200-200-20 MG/5ML suspension Take 30 mLs by mouth as needed for indigestion or heartburn. Max 4 doses per 24 hours    [provider]  amoxicillin-clavulanate (AUGMENTIN) 875-125 MG tablet Take 1 tablet by mouth every 12 (twelve) hours. 08/27/19   Petrucelli, Glynda Jaeger, PA-C  Ascorbic Acid (VITAMIN C WITH ROSE HIPS) 500 MG tablet Take 500 mg by mouth in the morning.    [provider]  aspirin 81 MG chewable tablet Chew 81 mg by mouth in the morning.     [provider]  baclofen (LIORESAL) 10 MG tablet Take 10 mg by mouth at bedtime.    [provider]  Carboxymethylcellulose Sod PF (REFRESH PLUS) 0.5 % SOLN Place 1 drop into both eyes 2 (two) times daily.     [provider]  Cholecalciferol (VITAMIN D-3) 125 MCG (5000 UT) TABS Take 5,000 Units by mouth in the morning.     [provider]  dextromethorphan (DELSYM) 30 MG/5ML liquid Take 60 mg by mouth every 4 (four) hours as needed for cough.     [provider]  divalproex (DEPAKOTE) 500 MG DR tablet Take 500 mg by mouth 3 (three) times daily.     [provider]  docusate sodium (COLACE) 100 MG capsule Take 100 mg by mouth in the morning.     [provider]  ENULOSE 10  GM/15ML SOLN Take 10 g by mouth daily as needed (for mild constipation).  08/22/18   [provider]  fluticasone furoate-vilanterol (BREO ELLIPTA) 100-25 MCG/INH AEPB Inhale 1 puff into the lungs daily. Patient taking differently: Inhale 1 puff into the lungs daily. RINSE MOUTH WITH WATER AFTER USE 12/17/16   Baird Lyons D, MD  gabapentin (NEURONTIN) 300 MG capsule Take 300-600 mg by mouth See admin instructions. Take 300 mg by mouth in the morning and 600 mg at bedtime    [provider]  guaifenesin (ROBITUSSIN) 100 MG/5ML syrup Take 200 mg by mouth every 6 (six) hours as needed for cough.     [provider]  HYDROcodone-acetaminophen (NORCO/VICODIN) 5-325 MG tablet Take 1 tablet by mouth 3 (three) times daily.    [provider]  hydrocortisone 1 % ointment Apply 1 application topically 2 (two) times daily as needed ((hemorrhoidal flare)).     [provider]  insulin glargine (LANTUS) 100 UNIT/ML injection Inject 15 Units into the skin at bedtime. *Hold if blood glucose is less than 160*    [provider]  ipratropium (ATROVENT) 0.03 % nasal spray 1-2 puffs each nostril 3 times daily, before meals Patient taking differently: Place 1 spray into both nostrils 3 (three) times daily before meals.  01/12/17   Deneise Lever, MD  levothyroxine (SYNTHROID, LEVOTHROID) 75 MCG tablet TAKE (1) TABLET BY MOUTH ONCE DAILY BEFORE BREAKFAST. Patient taking differently: Take 75 mcg by mouth daily before breakfast.  01/12/15   Janith Lima, MD  loperamide (IMODIUM A-D) 2 MG tablet Take 2 mg by mouth  4 (four) times daily as needed for diarrhea or loose stools.     [provider]  lubiprostone (AMITIZA) 8 MCG capsule Take 1 capsule (8 mcg total) by mouth 2 (two) times daily with a meal. Patient taking differently: Take 8 mcg by mouth 2 (two) times daily as needed for constipation.  06/27/19   Carlis Stable, NP  magnesium hydroxide (MILK OF  MAGNESIA) 400 MG/5ML suspension Take 30 mLs by mouth at bedtime as needed for mild constipation.     [provider]  Menthol, Topical Analgesic, (BIOFREEZE) 4 % GEL Apply 1 application topically 2 (two) times daily. Apply to lower back and base of right thumb twice daily.    [provider]  metoprolol succinate (TOPROL-XL) 50 MG 24 hr tablet Take 50 mg by mouth in the morning. Take with or immediately following a meal.     [provider]  Multiple Vitamins-Minerals (MULTIVITAMINS THER. W/MINERALS) TABS tablet TAKE ONE TABLET BY MOUTH ONCE DAILY. Patient taking differently: Take 1 tablet by mouth in the morning.  04/16/15   Janith Lima, MD  neomycin-bacitracin-polymyxin (NEOSPORIN) ointment Apply 1 application topically as needed for wound care.    [provider]  NOVOLOG FLEXPEN 100 UNIT/ML FlexPen Inject 8 Units into the skin 3 (three) times daily before meals.  07/01/15   [provider]  Omega 3 1000 MG CAPS TAKE (1) CAPSULE BY MOUTH ONCE DAILY. Patient taking differently: Take 1,000 mg by mouth in the morning.  04/16/15   Janith Lima, MD  pantoprazole (PROTONIX) 40 MG tablet Take 40 mg by mouth daily before breakfast.     [provider]  PARoxetine (PAXIL) 10 MG tablet Take 10 mg by mouth daily.  10/07/18   [provider]  Polyethyl Glycol-Propyl Glycol (SYSTANE ULTRA) 0.4-0.3 % SOLN Place 1 drop into both eyes 4 (four) times daily as needed (dry eyes).    [provider]  polyethylene glycol (MIRALAX / GLYCOLAX) packet Take 17 g by mouth daily as needed for moderate constipation. Patient taking differently: Take 17 g by mouth 2 (two) times daily as needed for moderate constipation.  10/01/17   Orpah Greek, MD  potassium chloride SA (K-DUR,KLOR-CON) 20 MEQ tablet Take 20 mEq by mouth daily.    [provider]  predniSONE (STERAPRED UNI-PAK 21 TAB) 10 MG (21) TBPK tablet Take by mouth daily. 6, 5, 4,  3, 2, 1 take as written 08/27/19   Petrucelli, Samantha R, PA-C  primidone (MYSOLINE) 50 MG tablet Take 50 mg by mouth in the morning.     [provider]  QUEtiapine (SEROQUEL) 100 MG tablet Take 100 mg by mouth at bedtime.    [provider]  simvastatin (ZOCOR) 5 MG tablet Take 5 mg by mouth at bedtime.  08/21/17   [provider]  sitaGLIPtin (JANUVIA) 100 MG tablet Take 100 mg by mouth daily.    [provider]  sodium chloride (OCEAN) 0.65 % SOLN nasal spray Take 1-2 sprays in nostril every hour as needed for rhinitis Patient taking differently: Place 1 spray into both nostrils every hour as needed for congestion.  06/08/17   Baird Lyons D, MD  sodium chloride 1 g tablet Take 1 g by mouth 2 (two) times daily.    [provider]  Valbenazine Tosylate (INGREZZA) 40 MG CAPS Take 40 mg by mouth at bedtime.    [provider]  zinc gluconate 50 MG tablet Take 50  mg by mouth daily.    [provider]    Physical Exam: Vitals:   09/11/19 0145 09/11/19 0200 09/11/19 0216 09/11/19 0237  BP: 101/80 (!) 87/71 102/90 124/80  Pulse: (!) 101 94 94 89  Resp: 15 19 17 15   Temp:      TempSrc:      SpO2: 97% 100% 100% 100%  Weight:      Height:        Constitutional: NAD, calm, comfortable Vitals:   09/11/19 0145 09/11/19 0200 09/11/19 0216 09/11/19 0237  BP: 101/80 (!) 87/71 102/90 124/80  Pulse: (!) 101 94 94 89  Resp: 15 19 17 15   Temp:      TempSrc:      SpO2: 97% 100% 100% 100%  Weight:      Height:        General: Patient is an obese 69 year old Caucasian female who is not pointing to verbal commands and on 2 L of oxygen with nasal cannula. Eyes: Bilateral pinpoint pupils, eye lids and conjunctivae normal ENMT: Mucous membranes are moist. Posterior pharynx clear of any exudate or lesions.Normal dentition.  Neck: normal, supple, no masses, no thyromegaly Respiratory: Patient is on 2 L of oxygen with nasal cannula.   Diminished breath sounds in bilateral lower lobes.  Mild rhonchi in left lower lobe but no wheezes or rails and crepitations on auscultation.    Cardiovascular: Regular rate and rhythm, no murmurs / rubs / gallops. No extremity edema. 2+ pedal pulses. No carotid bruits.  Abdomen: no tenderness, no masses palpated. No hepatosplenomegaly. Bowel sounds positive.  Musculoskeletal: no clubbing / cyanosis. No joint deformity upper and lower extremities.  Range of motion could not be checked because of patient's current condition Skin: no rashes, lesions, ulcers. No induration Neurologic: Patient is sleeping deeply and difficult to arouse with verbal commands although she started opening her eyes with rubbing her chest after a dose of Narcan given in the ED.  The complete neurological examination could not be done because of patient's current condition.  Patient is unable to answer any questions after opening her eyes and go back to sleep in 3 to 4 seconds. (Anything < 9 systems with 2 bullets each down codes to level 1) (If patient refuses exam can't bill higher level) (Make sure to document decubitus ulcers present on admission -- if possible -- and whether patient has chronic indwelling catheter at time of admission)  Labs on Admission: I have personally reviewed following labs and imaging studies  CBC: Recent Labs  Lab 09/10/19 2307  WBC 5.6  NEUTROABS 3.2  HGB 11.2*  HCT 32.9*  MCV 100.9*  PLT 378*   Basic Metabolic Panel: Recent Labs  Lab 09/10/19 2307  NA 131*  K 4.0  CL 96*  CO2 26  GLUCOSE 157*  BUN 10  CREATININE 0.68  CALCIUM 8.6*   GFR: Estimated Creatinine Clearance: 70.8 mL/min (by C-G formula based on SCr of 0.68 mg/dL). Liver Function Tests: Recent Labs  Lab 09/10/19 2307  AST 26  ALT 14  ALKPHOS 42  BILITOT 1.1  PROT 6.3*  ALBUMIN 3.3*   No results for input(s): LIPASE, AMYLASE in the last 168 hours. Recent Labs  Lab 09/10/19 2322  AMMONIA 12    Coagulation Profile: No results for input(s): INR, PROTIME in the last 168 hours. Cardiac Enzymes: No results for input(s): CKTOTAL, CKMB, CKMBINDEX, TROPONINI in the last 168 hours. BNP (last 3 results) No results for input(s): PROBNP in the  last 8760 hours. HbA1C: No results for input(s): HGBA1C in the last 72 hours. CBG: Recent Labs  Lab 09/10/19 2242  GLUCAP 148*   Lipid Profile: No results for input(s): CHOL, HDL, LDLCALC, TRIG, CHOLHDL, LDLDIRECT in the last 72 hours. Thyroid Function Tests: No results for input(s): TSH, T4TOTAL, FREET4, T3FREE, THYROIDAB in the last 72 hours. Anemia Panel: No results for input(s): VITAMINB12, FOLATE, FERRITIN, TIBC, IRON, RETICCTPCT in the last 72 hours. Urine analysis:    Component Value Date/Time   COLORURINE AMBER (A) 09/10/2019 2307   APPEARANCEUR CLEAR 09/10/2019 2307   LABSPEC 1.023 09/10/2019 2307   PHURINE 6.0 09/10/2019 2307   GLUCOSEU 50 (A) 09/10/2019 2307   GLUCOSEU NEGATIVE 02/13/2015 1455   HGBUR NEGATIVE 09/10/2019 2307   BILIRUBINUR NEGATIVE 09/10/2019 2307   KETONESUR 20 (A) 09/10/2019 2307   PROTEINUR NEGATIVE 09/10/2019 2307   UROBILINOGEN 0.2 02/13/2015 1455   NITRITE NEGATIVE 09/10/2019 2307   LEUKOCYTESUR NEGATIVE 09/10/2019 2307    Radiological Exams on Admission: CT Head Wo Contrast  Result Date: 09/11/2019 CLINICAL DATA:  Altered mental status EXAM: CT HEAD WITHOUT CONTRAST TECHNIQUE: Contiguous axial images were obtained from the base of the skull through the vertex without intravenous contrast. COMPARISON:  CT 09/28/2014 FINDINGS: Brain: No evidence of acute infarction, hemorrhage, hydrocephalus, extra-axial collection or mass lesion/mass effect. Symmetric prominence of the ventricles, cisterns and sulci compatible with parenchymal volume loss. Patchy areas of white matter hypoattenuation are most compatible with chronic microvascular angiopathy. Vascular: Atherosclerotic calcification of the carotid  siphons. No hyperdense vessel. Skull: No calvarial fracture or suspicious osseous lesion. Small burr holes towards the vertex for stimulator leads. Coiled leads along the scalp. No scalp swelling or hematoma. Sinuses/Orbits: Paranasal sinuses and mastoid air cells are predominantly clear. Included orbital structures are unremarkable. Other: Bilateral deep brain stimulator leads terminate in stable position within the subthalamic region bilaterally. Resulting streak artifact. No evidence of lead discontinuity along the intracranial or extracranial course. IMPRESSION: 1. No acute intracranial findings. 2. Stable parenchymal volume loss and chronic microvascular angiopathy. 3. Bilateral deep brain stimulator leads terminate in stable position within the subthalamic region bilaterally. No evidence of lead discontinuity along the intracranial or extracranial course. Electronically Signed   By: Lovena Le M.D.   On: 09/11/2019 00:09   DG Chest Port 1 View  Result Date: 09/10/2019 CLINICAL DATA:  Altered mental status EXAM: PORTABLE CHEST 1 VIEW COMPARISON:  Radiograph 08/27/2019 FINDINGS: Bilateral neurostimulator generators project over the left and right chest walls without lead discontinuity. Telemetry leads overlie the chest. New opacity is seen in the periphery of the left lung base. No other focal consolidation. Low volumes with atelectasis. No pneumothorax or effusion. No frank edema. Cardiomediastinal contours are similar to prior counting for differences in technique with a calcified aorta. No acute osseous or soft tissue abnormality. IMPRESSION: New opacity in the periphery of the left lung base, concerning for pneumonia or aspiration in the appropriate clinical setting. Electronically Signed   By: Lovena Le M.D.   On: 09/10/2019 23:51    EKG: Independently reviewed.   Assessment/Plan Principal Problem:   Altered mental status Patient presented with altered mental status and decreased  responsiveness from skilled nursing facility.  CT head, ammonia level, alcohol level and valproic level within normal limits.  UDS positive for opiates and barbiturates despite of missing her doses in the skilled nursing facility because of confusion.  Chest x-ray showed bilateral lower lobe infiltrates more pronounced on left side most likely  secondary to aspiration with previous history of aspiration or community-acquired pneumonia.  Patient is given a dose of Narcan in the ED and patient has started opening eyes but still very drowsy and not able to answer any questions.  Patient was started on IV ceftriaxone and IV azithromycin for pneumonia we will continue both antibiotics.  Blood cultures ordered.  During my evaluation patient was also severely hypotensive with blood pressure of 87/50 and a bolus of normal saline ordered.  Patient was also hypoxic in the ED with oxygen saturation of 84% and started on 2 L of oxygen with nasal cannula.  Continuous telemetry monitoring.  Active Problems: Opioids and barbiturates overdose Patient most probably overdosed opioids and barbiturates.  ED nurse reported that the staff from skilled nursing facility reported that patient is most probably hoarding medications in her room.  Patient was given a dose of Narcan and she is arousable with chest rubbing now but still not able to answer any question and go back to sleep in few seconds.  Continue to monitor  Hypoxia Patient required 2 L of oxygen in the ED for maintaining her oxygen saturation within normal limits.  Chest x-ray positive for bilateral lower lobe infiltrates.  COVID-19 test ordered and pending.  Aspiration versus community-acquired pneumonia Chest x-ray positive for bilateral lower lobe infiltrates and there is high suspicious of aspiration pneumonia because of the previous history of aspiration pneumonia or it could be community-acquired pneumonia.  Continue IV Rocephin and IV azithromycin.  Blood  cultures ordered.  Albuterol inhaler 2 puffs every 4 hours as needed for shortness of breath.  Hypotension Patient has a history of hypertension but severely hypotensive at this time.  1 L bolus of normal saline ordered and will continue to monitor the blood pressure.  Hold home blood pressure medication.  Hyperglycemia Moderate dose sliding scale insulin ordered. Blood glucose monitoring and hypoglycemic protocol in place.  Asthma Stable Albuterol inhaler 2 puffs every 4 hours as needed ordered for shortness of breath.    Hypothyroidism Patient will be started on home hypothyroidism medications once the patient become awake alert and oriented and medications confirmation by the pharmacy.   Hyperlipidemia Continue home medications after patient become fully oriented and able to take p.o. medications.  Medications not confirmed by pharmacy at this time.  Depression Home medications will be restarted after confirmation by the pharmacy       DVT prophylaxis: Lovenox (Lovenox/Heparin/SCD's/anticoagulated/None (if comfort care) Code Status: Full code (Full/Partial (specify details) Family Communication: No family member present at bedside Disposition Plan: Patient will be discharged to skilled nursing facility Consults called: None Admission status: Observation/telemetry   Edmonia Lynch MD [facility] Pager [phone]   If 7PM-7AM, please contact night-coverage [email or web address] Password   09/11/2019, 3:19 AM   Note: Patient's Covid 19 test result came back positive.

## 2019-09-11 NOTE — Plan of Care (Signed)
  Problem: Education: Goal: Knowledge of General Education information will improve Description: Including pain rating scale, medication(s)/side effects and non-pharmacologic comfort measures Outcome: Not Progressing   Problem: Health Behavior/Discharge Planning: Goal: Ability to manage health-related needs will improve Outcome: Not Progressing   Problem: Clinical Measurements: Goal: Ability to maintain clinical measurements within normal limits will improve Outcome: Not Progressing Goal: Will remain free from infection Outcome: Not Progressing Goal: Diagnostic test results will improve Outcome: Not Progressing Goal: Respiratory complications will improve Outcome: Not Progressing Goal: Cardiovascular complication will be avoided Outcome: Not Progressing   Problem: Nutrition: Goal: Adequate nutrition will be maintained Outcome: Not Progressing   Problem: Activity: Goal: Risk for activity intolerance will decrease Outcome: Not Progressing

## 2019-09-11 NOTE — ED Notes (Signed)
Pt noted to be more alert with slurred speech and drooling. MD made aware and coming to bedside.

## 2019-09-11 NOTE — TOC Initial Note (Signed)
Transition of Care [facility]) - Initial/Assessment Note    Patient Details  Name: [name] MRN: [id number] Date of Birth: 25-May-1950  Transition of Care [facility]) CM/SW Contact:    Shade Flood, LCSW Phone Number: 09/11/2019, 11:21 AM  Clinical Narrative:                  Pt admitted from [facility]. Pt has positive COVID test in ED. Spoke with staff at ALF to assess. Staff state that pt had COVID in December. Pt reportedly receives minimal assist with ADLs at the facility. She cannot ambulate but can pivot. She uses a wheelchair independently.   Pt can return to the facility at dc. She will need EMS transport.   TOC will follow.  Expected Discharge Plan: Assisted Living Barriers to Discharge: Continued Medical Work up   Patient Goals and CMS Choice        Expected Discharge Plan and Services Expected Discharge Plan: Assisted Living In-house Referral: Clinical Social Work   Post Acute Care Choice: Resumption of Svcs/PTA Provider Living arrangements for the past 2 months: [place]                                      Prior Living Arrangements/Services Living arrangements for the past 2 months: [place] Lives with:: Facility Resident Patient language and need for interpreter reviewed:: Yes Do you feel safe going back to the place where you live?: Yes      Need for Family Participation in Patient Care: No (Comment) Care giver support system in place?: Yes (comment) Current home services: DME Criminal Activity/Legal Involvement Pertinent to Current Situation/Hospitalization: No - Comment as needed  Activities of Daily Living      Permission Sought/Granted                  Emotional Assessment       Orientation: : Oriented to Self Alcohol / Substance Use: Not Applicable Psych Involvement: No (comment)  Admission diagnosis:  Altered mental status [R41.82] Acute hypoxemic respiratory failure due to  COVID-19 ([place]) [U07.1, J96.01] Patient Active Problem List   Diagnosis Date Noted  . Altered mental status 09/11/2019  . Opioid overdose ([place]) 09/11/2019  . Hypotension 09/11/2019  . Acute hypoxemic respiratory failure due to COVID-19 ([place]) 09/11/2019  . Uncomplicated asthma 28/36/6294  . Skin erythema 09/28/2018  . Umbilical pain 76/54/6503  . Constipation 03/18/2017  . CAP (community acquired pneumonia) 04/21/2016  . History of colonic polyps   . Diverticulosis of colon without hemorrhage   . Other hemorrhoids   . [name]'s ring   . Mucosal abnormality of stomach   . Dysphagia   . Encounter for screening colonoscopy 02/21/2015  . Upper airway cough syndrome 01/18/2015  . OAB (overactive bladder) 12/30/2014  . Incontinence in female 12/25/2014  . MDD (major depressive disorder), recurrent severe, without psychosis ([place]) 12/14/2014  . Thrush of mouth and esophagus ([place]) 11/23/2014  . Depression with somatization 11/05/2014  . Type II diabetes mellitus with manifestations ([place]) 10/23/2014  . Visit for screening mammogram 08/21/2014  . Hypokalemia 05/30/2014  . Tinea corporis 05/30/2014  . Lumbar scoliosis 04/24/2014  . Obstructive sleep apnea 04/23/2014  . Moderate persistent asthma with acute bronchitis and acute exacerbation 03/02/2014  . Allergy to angiotensin receptor blockers (ARB) 01/22/2014  . S/P deep brain stimulator placement 01/10/2014  . Greater trochanteric bursitis of left  hip 11/07/2013  . Spinal stenosis of lumbar region 10/30/2013  . Right thyroid nodule 10/12/2013  . CMC arthritis, thumb, degenerative 06/15/2013  . GERD (gastroesophageal reflux disease) 11/03/2012  . IBS (irritable bowel syndrome) 11/03/2012  . Esophageal stenosis 11/01/2012  . Asthma, moderate persistent, poorly-controlled 09/01/2012  . Hypothyroidism 12/22/2011  . Hyperlipidemia with target LDL less than 100 06/22/2008  . Essential hypertension 09/04/2007  . TREMOR, ESSENTIAL  04/19/2007  . Seasonal and perennial allergic rhinitis 04/19/2007   PCP:  Abran Richard, MD Pharmacy:   [facility], [address] [place]. [place] [address] Phone: [phone] Fax: [phone]  [facility], [place] [address] [place] [address] Phone: [phone] Fax: [phone]     Social Determinants of Health (SDOH) Interventions    Readmission Risk Interventions Readmission Risk Prevention Plan 09/11/2019  Transportation Screening Complete  Medication Review (RN CM) Complete  Some recent data might be hidden

## 2019-09-11 NOTE — Progress Notes (Addendum)
PROGRESS NOTE    [name]  YPP:[id number] DOB: 07-Dec-1950 DOA: 09/10/2019 PCP: Abran Richard, MD   Brief Narrative:  Per HPI:  [name] is a 69 y.o. female with medical history significant of hypertension, hyperlipidemia, diabetes mellitus, COPD, seizure disorder and depression is brought by EMS from skilled nursing facility for decreased responsiveness.  During my evaluation patient is deeply asleep and difficult to arouse therefore history is gathered from ED physician and medical records.  According to the ED nurse it was reported by nurse from [facility] that patient has been acting like she is drunk since last night and more altered than her baseline and did not return to her normal baseline during the day  It usually happens after she takes her narcotics.  It was also reported by the staff at skilled nursing facility that patient refused her narcotics and Lantus during the night and they think that patient is hoarding some medications in her room.  Patient is on hydrocodone-acetaminophen 5-325 3 times a day and she is also on divalproex, gabapentin, paroxetine, baclofen and quetiapine.  According to the ED physician patient is living in a skilled nursing facility because of altered mental status and multiple psychiatric problems.   -Patient was admitted with acute encephalopathy likely related to polypharmacy in the setting of opiate use.  She also states that she has recently had some medication increase in the setting of benign essential tremors and appears to be on baclofen as well as primidone.  She was also noted to have some mild shortness of breath as well as dry cough and was initially thought to have community-acquired pneumonia for which she was started on azithromycin and Rocephin with bilateral infiltrates noted on chest x-ray.  She was also requiring 2 L of oxygen due to hypoxemia.  Her Covid test returned positive and she has now been started on treatment  with IV steroids as well as remdesivir.  She will need to be maintained on isolation precautions and will be transferred to [facility] telemetry bed for further care.  Azithromycin and Rocephin have been discontinued as procalcitonin is low and she does not have leukocytosis.  She is noted to have minimal elevation in inflammatory markers.  Continue to trend with labs ordered in a.m.  Assessment & Plan:   Principal Problem:   Altered mental status Active Problems:   Hypothyroidism   GERD (gastroesophageal reflux disease)   Opioid overdose ([name])   Hypotension   Acute hypoxemic respiratory failure due to COVID-19 [facility])   Acute toxic versus metabolic encephalopathy-improved -Likely related to polypharmacy with multiple sedating medications -There is question of overdose and hoarding of medications at her facility, but patient appears to deny this -Plan to review med reconciliation and reinitiate some of these medications, but hold narcotics and barbiturates; this does not appear to be present on her med reconciliation, so questionable where she received these medications  Acute hypoxemic respiratory failure secondary to COVID-19 pneumonia -Procalcitonin level and therefore antibiotics have been discontinued -Continue on IV methylprednisolone -Remdesivir per pharmacy -Antitussives -Albuterol as needed for shortness of breath or wheezing -Vitamin C and zinc -Isolation precautions and transferred to [facility] for further treatment -Follow repeat labs as ordered  Hypertension -Initially hypotensive -Plan to resume home blood pressure medications and monitor carefully  Hyperglycemia in setting of type 2 diabetes -Maintain on aggressive sliding scale insulin especially in setting of steroids -Started on Tradjenta -Holding home oral agents for now -Monitor carefully on carb  modified diet -Hemoglobin A1c 7.4%  Thrombocytopenia -Hold Lovenox in place on SCDs  Anemia -Plan to check  anemia panel and stool occult  History of asthma -No active wheezing currently noted -Albuterol as ordered for shortness of breath or wheezing  Hypothyroidism -Resume home levothyroxine  Dyslipidemia -Resume home medications  Tardive dyskinesia -Resume home [place]  Depression -Resume home medications  DVT prophylaxis: Lovenox to SCDs Code Status: Full code Family Communication: None at bedside Disposition Plan: Transfer to [facility] for ongoing COVID-19 pneumonia treatment Status is: Inpatient  Remains inpatient appropriate because:IV treatments appropriate due to intensity of illness or inability to take PO   Dispo: The patient is from: SNF              Anticipated d/c is to: SNF              Anticipated d/c date is: 2 days              Patient currently is not medically stable to d/c.   Consultants:   None  Procedures:   See below  Antimicrobials:  Anti-infectives (From admission, onward)   Start     Dose/Rate Route Frequency Ordered Stop   09/12/19 1000  remdesivir 100 mg in sodium chloride 0.9 % 100 mL IVPB     100 mg 200 mL/hr over 30 Minutes Intravenous Daily 09/11/19 0727 09/16/19 0959   09/12/19 0030  azithromycin (ZITHROMAX) 500 mg in sodium chloride 0.9 % 250 mL IVPB  Status:  Discontinued    Note to Pharmacy: Pharmacy to adjust the timing of next dose   500 mg 250 mL/hr over 60 Minutes Intravenous Every 24 hours 09/11/19 0224 09/11/19 0225   09/11/19 2200  cefTRIAXone (ROCEPHIN) 1 g in sodium chloride 0.9 % 100 mL IVPB  Status:  Discontinued    Note to Pharmacy: Pharmacy to adjust timing for next dose   1 g 200 mL/hr over 30 Minutes Intravenous Every 24 hours 09/11/19 0212 09/11/19 1058   09/11/19 2200  azithromycin (ZITHROMAX) 500 mg in sodium chloride 0.9 % 250 mL IVPB  Status:  Discontinued     500 mg 250 mL/hr over 60 Minutes Intravenous Every 24 hours 09/11/19 0218 09/11/19 1058   09/11/19 0830  remdesivir 200 mg in sodium chloride 0.9% 250  mL IVPB     200 mg 580 mL/hr over 30 Minutes Intravenous Once 09/11/19 0727 09/11/19 1037   09/11/19 0030  cefTRIAXone (ROCEPHIN) 2 g in sodium chloride 0.9 % 100 mL IVPB     2 g 200 mL/hr over 30 Minutes Intravenous  Once 09/11/19 0017 09/11/19 0131   09/11/19 0030  azithromycin (ZITHROMAX) 500 mg in sodium chloride 0.9 % 250 mL IVPB  Status:  Discontinued     500 mg 250 mL/hr over 60 Minutes Intravenous Every 24 hours 09/11/19 0017 09/11/19 0224       Subjective: Patient seen and evaluated today with no new acute complaints or concerns. No acute concerns or events noted overnight.  She is much more awake and alert this morning and is able to respond appropriately.  She continues to have a mild cough.  She does have difficulty with her speech and is edentulous.  Objective: Vitals:   09/11/19 0945 09/11/19 1000 09/11/19 1015 09/11/19 1030  BP: (!) 143/76     Pulse: 79   81  Resp: 16 18 17 17   Temp:      TempSrc:      SpO2: 99%  100%  Weight:      Height:       No intake or output data in the 24 hours ending 09/11/19 1105 Filed Weights   09/10/19 2244  Weight: 87.5 kg    Examination:  General exam: Appears calm and comfortable, edentulous Respiratory system: Clear to auscultation. Respiratory effort normal.  Currently on 2 L nasal cannula oxygen. Cardiovascular system: S1 & S2 heard, RRR. No JVD, murmurs, rubs, gallops or clicks. No pedal edema. Gastrointestinal system: Abdomen is nondistended, soft and nontender. No organomegaly or masses felt. Normal bowel sounds heard. Central nervous system: Alert and oriented. No focal neurological deficits. Extremities: Symmetric 5 x 5 power. Skin: No rashes, lesions or ulcers Psychiatry: Judgement and insight appear normal. Mood & affect appropriate.     Data Reviewed: I have personally reviewed following labs and imaging studies  CBC: Recent Labs  Lab 09/10/19 2307 09/11/19 0423  WBC 5.6 4.8  NEUTROABS 3.2  --   HGB  11.2* 10.4*  HCT 32.9* 31.5*  MCV 100.9* 102.6*  PLT 100* 87*   Basic Metabolic Panel: Recent Labs  Lab 09/10/19 2307 09/11/19 0423  NA 131* 136  K 4.0 4.1  CL 96* 102  CO2 26 27  GLUCOSE 157* 177*  BUN 10 8  CREATININE 0.68 0.59  CALCIUM 8.6* 8.0*   GFR: Estimated Creatinine Clearance: 70.8 mL/min (by C-G formula based on SCr of 0.59 mg/dL). Liver Function Tests: Recent Labs  Lab 09/10/19 2307  AST 26  ALT 14  ALKPHOS 42  BILITOT 1.1  PROT 6.3*  ALBUMIN 3.3*   No results for input(s): LIPASE, AMYLASE in the last 168 hours. Recent Labs  Lab 09/10/19 2322  AMMONIA 12   Coagulation Profile: No results for input(s): INR, PROTIME in the last 168 hours. Cardiac Enzymes: No results for input(s): CKTOTAL, CKMB, CKMBINDEX, TROPONINI in the last 168 hours. BNP (last 3 results) No results for input(s): PROBNP in the last 8760 hours. HbA1C: Recent Labs    09/10/19 2307  HGBA1C 7.4*   CBG: Recent Labs  Lab 09/10/19 2242 09/11/19 0748  GLUCAP 148* 165*   Lipid Profile: No results for input(s): CHOL, HDL, LDLCALC, TRIG, CHOLHDL, LDLDIRECT in the last 72 hours. Thyroid Function Tests: No results for input(s): TSH, T4TOTAL, FREET4, T3FREE, THYROIDAB in the last 72 hours. Anemia Panel: Recent Labs    09/11/19 0738  FERRITIN 106   Sepsis Labs: Recent Labs  Lab 09/10/19 2322 09/11/19 0738  PROCALCITON  --  <0.10  LATICACIDVEN 0.9  --     Recent Results (from the past 240 hour(s))  SARS Coronavirus 2 by RT PCR (hospital order, performed in [facility] hospital lab) Nasopharyngeal Nasopharyngeal Swab     Status: Abnormal   Collection Time: 09/11/19  1:34 AM   Specimen: Nasopharyngeal Swab  Result Value Ref Range Status   SARS Coronavirus 2 POSITIVE (A) NEGATIVE Final    Comment: RESULT CALLED TO, READ BACK BY AND VERIFIED WITH: W[place],C  0328 ON 09/11/19 BY JUW (NOTE) SARS-CoV-2 target nucleic acids are DETECTED SARS-CoV-2 RNA is generally detectable  in upper respiratory specimens  during the acute phase of infection.  Positive results are indicative  of the presence of the identified virus, but do not rule out bacterial infection or co-infection with other pathogens not detected by the test.  Clinical correlation with patient history and  other diagnostic information is necessary to determine patient infection status.  The expected result is negative. Fact Sheet for Patients:  StrictlyIdeas.no  Fact Sheet for Healthcare Providers:   BankingDealers.co.za   This test is not yet approved or cleared by the [place] FDA and  has been authorized for detection and/or diagnosis of SARS-CoV-2 by FDA under an Emergency Use Authorization (EUA).  This EUA will remain in effect (meaning this test can  be used) for the duration of  the COVID-19 declaration under Section 564(b)(1) of the Act, 21 U.S.C. section 360-bbb-3(b)(1), unless the authorization is terminated or revoked sooner. Performed at [facility], [address], [place], [place] [address]   Culture, blood (routine x 2)     Status: None (Preliminary result)   Collection Time: 09/11/19  4:23 AM   Specimen: BLOOD  Result Value Ref Range Status   Specimen Description BLOOD RIGHT ANTECUBITAL  Final   Special Requests   Final    BOTTLES DRAWN AEROBIC AND ANAEROBIC Blood Culture adequate volume   Culture   Final    NO GROWTH < 12 HOURS Performed at [facility], [address] [place], [place] [address]    Report Status PENDING  Incomplete         Radiology Studies: CT Head Wo Contrast  Result Date: 09/11/2019 CLINICAL DATA:  Altered mental status EXAM: CT HEAD WITHOUT CONTRAST TECHNIQUE: Contiguous axial images were obtained from the base of the skull through the vertex without intravenous contrast. COMPARISON:  CT 09/28/2014 FINDINGS: Brain: No evidence of acute infarction, hemorrhage, hydrocephalus, extra-axial collection or  mass lesion/mass effect. Symmetric prominence of the ventricles, cisterns and sulci compatible with parenchymal volume loss. Patchy areas of white matter hypoattenuation are most compatible with chronic microvascular angiopathy. Vascular: Atherosclerotic calcification of the carotid siphons. No hyperdense vessel. Skull: No calvarial fracture or suspicious osseous lesion. Small burr holes towards the vertex for stimulator leads. Coiled leads along the scalp. No scalp swelling or hematoma. Sinuses/Orbits: Paranasal sinuses and mastoid air cells are predominantly clear. Included orbital structures are unremarkable. Other: Bilateral deep brain stimulator leads terminate in stable position within the subthalamic region bilaterally. Resulting streak artifact. No evidence of lead discontinuity along the intracranial or extracranial course. IMPRESSION: 1. No acute intracranial findings. 2. Stable parenchymal volume loss and chronic microvascular angiopathy. 3. Bilateral deep brain stimulator leads terminate in stable position within the subthalamic region bilaterally. No evidence of lead discontinuity along the intracranial or extracranial course. Electronically Signed   By: Lovena Le M.D.   On: 09/11/2019 00:09   DG Chest Port 1 View  Result Date: 09/10/2019 CLINICAL DATA:  Altered mental status EXAM: PORTABLE CHEST 1 VIEW COMPARISON:  Radiograph 08/27/2019 FINDINGS: Bilateral neurostimulator generators project over the left and right chest walls without lead discontinuity. Telemetry leads overlie the chest. New opacity is seen in the periphery of the left lung base. No other focal consolidation. Low volumes with atelectasis. No pneumothorax or effusion. No frank edema. Cardiomediastinal contours are similar to prior counting for differences in technique with a calcified aorta. No acute osseous or soft tissue abnormality. IMPRESSION: New opacity in the periphery of the left lung base, concerning for pneumonia or  aspiration in the appropriate clinical setting. Electronically Signed   By: Lovena Le M.D.   On: 09/10/2019 23:51        Scheduled Meds: . vitamin C  500 mg Oral Daily  . enoxaparin (LOVENOX) injection  40 mg Subcutaneous Daily  . insulin aspart  0-5 Units Subcutaneous QHS  . insulin aspart  0-9 Units Subcutaneous TID WC  . insulin detemir  0.15 Units/kg Subcutaneous BID  . linagliptin  5 mg Oral Daily  . methylPREDNISolone (SOLU-MEDROL) injection  0.5 mg/kg Intravenous Q12H  . zinc sulfate  220 mg Oral Daily   Continuous Infusions: . [START ON 09/12/2019] remdesivir 100 mg in NS 100 mL       LOS: 0 days    Time spent: 45 minutes    Mabell Esguerra Darleen Crocker, DO [facility]  If 7PM-7AM, please contact night-coverage [email or web address] 09/11/2019, 11:05 AM

## 2019-09-11 NOTE — Plan of Care (Signed)
  Problem: Education: Goal: Knowledge of General Education information will improve Description: Including pain rating scale, medication(s)/side effects and non-pharmacologic comfort measures 09/11/2019 1736 by Jenita Seashore, RN Outcome: Not Progressing 09/11/2019 1602 by Jenita Seashore, RN Outcome: Not Progressing   Problem: Health Behavior/Discharge Planning: Goal: Ability to manage health-related needs will improve 09/11/2019 1736 by Jenita Seashore, RN Outcome: Not Progressing 09/11/2019 1602 by Jenita Seashore, RN Outcome: Not Progressing   Problem: Clinical Measurements: Goal: Ability to maintain clinical measurements within normal limits will improve 09/11/2019 1736 by Jenita Seashore, RN Outcome: Not Progressing 09/11/2019 1602 by Jenita Seashore, RN Outcome: Not Progressing Goal: Will remain free from infection 09/11/2019 1736 by Jenita Seashore, RN Outcome: Not Progressing 09/11/2019 1602 by Jenita Seashore, RN Outcome: Not Progressing Goal: Diagnostic test results will improve 09/11/2019 1736 by Jenita Seashore, RN Outcome: Not Progressing 09/11/2019 1602 by Jenita Seashore, RN Outcome: Not Progressing Goal: Respiratory complications will improve 09/11/2019 1736 by Jenita Seashore, RN Outcome: Not Progressing 09/11/2019 1602 by Jenita Seashore, RN Outcome: Not Progressing Goal: Cardiovascular complication will be avoided 09/11/2019 1736 by Jenita Seashore, RN Outcome: Not Progressing 09/11/2019 1602 by Jenita Seashore, RN Outcome: Not Progressing   Problem: Nutrition: Goal: Adequate nutrition will be maintained 09/11/2019 1736 by Jenita Seashore, RN Outcome: Not Progressing 09/11/2019 1602 by Jenita Seashore, RN Outcome: Not Progressing   Problem: Activity: Goal: Risk for activity intolerance will decrease 09/11/2019 1736 by Jenita Seashore,  RN Outcome: Not Progressing 09/11/2019 1602 by Jenita Seashore, RN Outcome: Not Progressing   Problem: Coping: Goal: Level of anxiety will decrease 09/11/2019 1736 by Jenita Seashore, RN Outcome: Not Progressing 09/11/2019 1602 by Jenita Seashore, RN Outcome: Not Progressing   Problem: Pain Managment: Goal: General experience of comfort will improve 09/11/2019 1736 by Jenita Seashore, RN Outcome: Not Progressing 09/11/2019 1602 by Jenita Seashore, RN Outcome: Not Progressing   Problem: Safety: Goal: Ability to remain free from injury will improve 09/11/2019 1736 by Jenita Seashore, RN Outcome: Not Progressing 09/11/2019 1602 by Jenita Seashore, RN Outcome: Not Progressing

## 2019-09-11 NOTE — ED Notes (Signed)
Left message for [name] about transfer to [facility].

## 2019-09-11 NOTE — Progress Notes (Signed)
Patient arrived via EMS on floor.  Skin assessment was completed by myself and Jacqulyn Bath, Therapist, sports. Patient has minor abrasion to her left knee from a fall at her assisted living.  Patient is alert and oriented x4 with slurry speech.  Patient was evaluated at previous facility for stroke and there is no stroke found. Patient states she's always had slurry speech. Patient was reported to use a walker at [facility] but she states she feels weak in her legs and cannot ambulate.  Patient is fully aware of surroundings and is pleasant toward all staff.  Patient was transferred from stretcher to her bed without any difficulty. Patient resting comfortably in bed which is in low position and wheels are locked. Patient has call bell in her reach and with ekg monitor (bedside )  On in place. Initial vitals obtained.  Central Telemetry called & verified all information.  Patient has nasal cannula 2 LPM on in place and does not complain of any dyspnea.  Patient has no difficulty moving extremities x4.  Fraser Din has purwick in place as well.  2 skide rales are up.

## 2019-09-11 NOTE — ED Notes (Signed)
Date and time results received: 09/11/19 3:30 AM  Test: Covid Critical Value: +  Name of Provider Notified: Dr. Humphrey Rolls  Orders Received? Or Actions Taken?: Actions Taken: n/a

## 2019-09-12 DIAGNOSIS — U071 COVID-19: Principal | ICD-10-CM

## 2019-09-12 DIAGNOSIS — D696 Thrombocytopenia, unspecified: Secondary | ICD-10-CM

## 2019-09-12 LAB — COMPREHENSIVE METABOLIC PANEL
ALT: 16 U/L (ref 0–44)
AST: 14 U/L — ABNORMAL LOW (ref 15–41)
Albumin: 2.7 g/dL — ABNORMAL LOW (ref 3.5–5.0)
Alkaline Phosphatase: 41 U/L (ref 38–126)
Anion gap: 9 (ref 5–15)
BUN: 8 mg/dL (ref 8–23)
CO2: 26 mmol/L (ref 22–32)
Calcium: 8.6 mg/dL — ABNORMAL LOW (ref 8.9–10.3)
Chloride: 103 mmol/L (ref 98–111)
Creatinine, Ser: 0.54 mg/dL (ref 0.44–1.00)
GFR calc Af Amer: >60 mL/min (ref >60)
GFR calc non Af Amer: >60 mL/min (ref >60)
Glucose, Bld: 203 mg/dL — ABNORMAL HIGH (ref 70–99)
Potassium: 3.9 mmol/L (ref 3.5–5.1)
Sodium: 138 mmol/L (ref 135–145)
Total Bilirubin: 0.3 mg/dL (ref 0.3–1.2)
Total Protein: 5.4 g/dL — ABNORMAL LOW (ref 6.5–8.1)

## 2019-09-12 LAB — FERRITIN: Ferritin: 128 ng/mL (ref 11–307)

## 2019-09-12 LAB — CBC WITH DIFFERENTIAL/PLATELET
Abs Immature Granulocytes: 0.01 10*3/uL (ref 0.00–0.07)
Basophils Absolute: 0 10*3/uL (ref 0.0–0.1)
Basophils Relative: 0 %
Eosinophils Absolute: 0 10*3/uL (ref 0.0–0.5)
Eosinophils Relative: 0 %
HCT: 33.2 % — ABNORMAL LOW (ref 36.0–46.0)
Hemoglobin: 11.1 g/dL — ABNORMAL LOW (ref 12.0–15.0)
Immature Granulocytes: 0 %
Lymphocytes Relative: 25 %
Lymphs Abs: 1.1 10*3/uL (ref 0.7–4.0)
MCH: 34 pg (ref 26.0–34.0)
MCHC: 33.4 g/dL (ref 30.0–36.0)
MCV: 101.8 fL — ABNORMAL HIGH (ref 80.0–100.0)
Monocytes Absolute: 0.1 10*3/uL (ref 0.1–1.0)
Monocytes Relative: 3 %
Neutro Abs: 3.2 10*3/uL (ref 1.7–7.7)
Neutrophils Relative %: 72 %
Platelets: 102 10*3/uL — ABNORMAL LOW (ref 150–400)
RBC: 3.26 MIL/uL — ABNORMAL LOW (ref 3.87–5.11)
RDW: 12.8 % (ref 11.5–15.5)
WBC: 4.5 10*3/uL (ref 4.0–10.5)
nRBC: 0 % (ref 0.0–0.2)

## 2019-09-12 LAB — MAGNESIUM: Magnesium: 1.9 mg/dL (ref 1.7–2.4)

## 2019-09-12 LAB — GLUCOSE, CAPILLARY
Glucose-Capillary: 167 mg/dL — ABNORMAL HIGH (ref 70–99)
Glucose-Capillary: 246 mg/dL — ABNORMAL HIGH (ref 70–99)
Glucose-Capillary: 236 mg/dL — ABNORMAL HIGH (ref 70–99)
Glucose-Capillary: 204 mg/dL — ABNORMAL HIGH (ref 70–99)

## 2019-09-12 LAB — C-REACTIVE PROTEIN: CRP: 2 mg/dL — ABNORMAL HIGH (ref <1.0)

## 2019-09-12 LAB — D-DIMER, QUANTITATIVE: D-Dimer, Quant: 0.41 ug/mL-FEU (ref 0.00–0.50)

## 2019-09-12 MED ORDER — ENOXAPARIN SODIUM 30 MG/0.3ML ~~LOC~~ SOLN
30.0000 mg | SUBCUTANEOUS | Status: DC
  Administered 2019-09-12: 30 mg via SUBCUTANEOUS
  Filled 2019-09-12: qty 0.3

## 2019-09-12 MED ORDER — ENOXAPARIN SODIUM 40 MG/0.4ML ~~LOC~~ SOLN
40.0000 mg | SUBCUTANEOUS | Status: DC
  Administered 2019-09-13 – 2019-09-15 (×3): 40 mg via SUBCUTANEOUS
  Filled 2019-09-12 (×3): qty 0.4

## 2019-09-12 MED ORDER — INSULIN ASPART 100 UNIT/ML ~~LOC~~ SOLN
4.0000 [IU] | Freq: Three times a day (TID) | SUBCUTANEOUS | Status: DC
  Administered 2019-09-12 – 2019-09-15 (×8): 4 [IU] via SUBCUTANEOUS

## 2019-09-12 NOTE — TOC Progression Note (Signed)
Transition of Care [facility]) - Progression Note    Patient Details  Name: [name] MRN: [id number] Date of Birth: 1950/12/17  Transition of Care [facility]) CM/SW [place], LCSW Phone Number: 09/12/2019, 11:17 AM  Clinical Narrative:    CSW spoke with [facility] RN. She reports patient has received both COVID vaccines. She was not on oxygen and was on a regular diet. If patient requires home health services, the facility will be able to order it if on the FL2. Will fax info as available (f. [phone]).   Expected Discharge Plan: Assisted Living Barriers to Discharge: Continued Medical Work up  Expected Discharge Plan and Services Expected Discharge Plan: Assisted Living In-house Referral: Clinical Social Work   Post Acute Care Choice: Resumption of Svcs/PTA Provider Living arrangements for the past 2 months: Assisted Living Facility                                       Social Determinants of Health (SDOH) Interventions    Readmission Risk Interventions Readmission Risk Prevention Plan 09/11/2019  Transportation Screening Complete  Medication Review (RN CM) Complete  Some recent data might be hidden

## 2019-09-12 NOTE — Evaluation (Signed)
Physical Therapy Evaluation Patient Details Name: [name] MRN: [id number] DOB: 04-09-1950 Today's Date: 09/12/2019   History of Present Illness  69 y.o. female with medical history significant of hypertension, hyperlipidemia, diabetes mellitus, COPD, seizure disorder, essential tremors with deep brain stimulator, and depression is brought by EMS from [place]  for decreased responsiveness. Admitted 09/10/19 for acute toxic encephalopathy and found to be COVID positive.   Clinical Impression  PTA pt living at [facility] where she reports she was able to transfer from bed to [facility], and wheelchair, however pt reports that last year she was ambulating with Rollator. Facility provides assist for bathing/dressing and iADLs. Pt is currently limited in safe mobility by decreased cognition, in presence of essential tremors, generalized weakness and decreased balance. Pt currently requires min A for bed mobility and modA for stand pivot transfer to recliner. PT recommends SNF level rehab at discharge. PT will continue to follow acutely.     Follow Up Recommendations SNF    Equipment Recommendations  None recommended by PT    Recommendations for Other Services       Precautions / Restrictions Precautions Precautions: Fall Restrictions Weight Bearing Restrictions: Yes      Mobility  Bed Mobility Overal bed mobility: Needs Assistance Bed Mobility: Supine to Sit     Supine to sit: Min assist     General bed mobility comments: pt able to bring LE to EoB and push trunk to upright, requires min A for pad scoot of hips to EoB  Transfers Overall transfer level: Needs assistance Equipment used: 1 person hand held assist Transfers: Stand Pivot Transfers   Stand pivot transfers: Mod assist       General transfer comment: good power up, requires modA for steadying and stepping to drop arm recliner on her R , increased cuing and physical assist to bring R UE to R  armrest  Ambulation/Gait             General Gait Details: reports she does not ambulate at baseline        Balance Overall balance assessment: Needs assistance Sitting-balance support: Feet supported;Bilateral upper extremity supported Sitting balance-Leahy Scale: Fair     Standing balance support: Bilateral upper extremity supported;During functional activity Standing balance-Leahy Scale: Zero                               Pertinent Vitals/Pain Pain Assessment: No/denies pain    Home Living Family/patient expects to be discharged to:: Assisted living               Home Equipment: Wheelchair - manual;Bedside commode;Walker - 4 wheels      Prior Function Level of Independence: Needs assistance   Gait / Transfers Assistance Needed: reports she a year ago she ambulated with a Rollator, however the last year she has been transferring to [facility] and wheelchair  ADL's / Homemaking Assistance Needed: assist for bathing and dressing, iADLs provided by facility         Hand Dominance        Extremity/Trunk Assessment   Upper Extremity Assessment Upper Extremity Assessment: Generalized weakness;Difficult to assess due to impaired cognition(tremors make it difficult to assess strength )    Lower Extremity Assessment Lower Extremity Assessment: Generalized weakness;Difficult to assess due to impaired cognition(tremors make it difficult to assess strength )       Communication   Communication: Expressive difficulties  Cognition  Arousal/Alertness: Awake/alert Behavior During Therapy: WFL for tasks assessed/performed Overall Cognitive Status: History of cognitive impairments - at baseline                                 General Comments: alert and oriented, difficulty with sequencing       General Comments General comments (skin integrity, edema, etc.): VSS on RA     Exercises     Assessment/Plan    PT Assessment Patient needs  continued PT services  PT Problem List Decreased strength;Decreased range of motion;Decreased balance;Decreased mobility;Decreased coordination;Decreased cognition;Decreased safety awareness       PT Treatment Interventions DME instruction;Gait training;Functional mobility training;Therapeutic activities;Therapeutic exercise;Balance training;Cognitive remediation;Patient/family education    PT Goals (Current goals can be found in the Care Plan section)  Acute Rehab PT Goals Patient Stated Goal: keep some independence PT Goal Formulation: With patient Time For Goal Achievement: 09/26/19 Potential to Achieve Goals: Fair    Frequency Min 3X/week    AM-PAC PT "6 Clicks" Mobility  Outcome Measure Help needed turning from your back to your side while in a flat bed without using bedrails?: A Little Help needed moving from lying on your back to sitting on the side of a flat bed without using bedrails?: A Lot Help needed moving to and from a bed to a chair (including a wheelchair)?: A Lot Help needed standing up from a chair using your arms (e.g., wheelchair or bedside chair)?: A Lot Help needed to walk in hospital room?: Total Help needed climbing 3-5 steps with a railing? : Total 6 Click Score: 11    End of Session Equipment Utilized During Treatment: Gait belt Activity Tolerance: Patient tolerated treatment well Patient left: in chair;with call bell/phone within reach;with chair alarm set Nurse Communication: Mobility status PT Visit Diagnosis: Unsteadiness on feet (R26.81);Other abnormalities of gait and mobility (R26.89);Repeated falls (R29.6);Muscle weakness (generalized) (M62.81);History of falling (Z91.81);Difficulty in walking, not elsewhere classified (R26.2);Other symptoms and signs involving the nervous system (R29.898);Ataxic gait (R26.0)    Time: 1532-1550 PT Time Calculation (min) (ACUTE ONLY): 18 min   Charges:   PT Evaluation $PT Eval Moderate Complexity: 1 Mod           Alajah Witman B. Migdalia Dk PT, DPT Acute Rehabilitation Services Pager [phone] Office [phone]   [place] 09/12/2019, 4:51 PM

## 2019-09-12 NOTE — Progress Notes (Signed)
Patient had not voided into suction container by 0700. Bladder scan done with result = 438. Order received from Dr. Waldron Labs to straight cath patient. Straight cath done with return of 500 ml of clear, amber urine. Tolerated procedure well.

## 2019-09-12 NOTE — Progress Notes (Signed)
Inpatient Diabetes Program Recommendations  AACE/ADA: New Consensus Statement on Inpatient Glycemic Control (2015)  Target Ranges:  Prepandial:   less than 140 mg/dL      Peak postprandial:   less than 180 mg/dL (1-2 hours)      Critically ill patients:  140 - 180 mg/dL   Lab Results  Component Value Date   GLUCAP 246 (H) 09/12/2019   HGBA1C 7.4 (H) 09/10/2019    Review of Glycemic Control  Results for MEDINA, DEGRAFFENREID (MRN [id number]) as of 09/12/2019 12:17  Ref. Range 09/11/2019 07:48 09/11/2019 16:15 09/11/2019 21:43 09/12/2019 08:09 09/12/2019 12:10  Glucose-Capillary Latest Ref Range: 70 - 99 mg/dL 165 (H) 234 (H) 184 (H) 167 (H) 246 (H)   Diabetes history: DM 2 Outpatient Diabetes medications:  Lantus 15 units q HS, Novolog 8 units tid with meals, Januvia 100 mg daily Current orders for Inpatient glycemic control:  Novolog sensitive tid with meals and HS Levemir 13 units bid Tradjenta 5 mg daily Solumedrol 43.75 mg bid Inpatient Diabetes Program Recommendations:    Please consider adding Novolog meal coverage 4 units tid with meals (hold if patient eats less than 50% or NPO).   Thanks,  Adah Perl, RN, BC-ADM Inpatient Diabetes Coordinator Pager [phone] (8a-5p)

## 2019-09-12 NOTE — Progress Notes (Signed)
PROGRESS NOTE    Ervin Rothbauer  IDP:[id number] DOB: 14-Jul-1950 DOA: 09/10/2019 PCP: Abran Richard, MD   Brief Narrative:    [name] is a 69 y.o. female with medical history significant of hypertension, hyperlipidemia, diabetes mellitus, COPD, seizure disorder, essential tremors with deep brain stimulator, and depression is brought by EMS from skilled nursing facility for decreased responsiveness.  Where her presentation was significant initially for increased lethargy and stupor, there was no seizure activity noted, initial evaluation was thought it secondary to polypharmacy, as [place] [facility] this is how she is usually after receiving her narcotics, and apparently she is on hydrocodone/acetaminophen 3 times daily,  It was also reported by the staff at skilled nursing facility that patient refused her narcotics and Lantus during the night and they think that patient is hoarding some medications in her room.  Patient is on hydrocodone-acetaminophen 5-325 3 times a day and she is also on divalproex, gabapentin, paroxetine, baclofen, primidone and quetiapine.  According to the ED physician patient is living in a skilled nursing facility because of altered mental status and multiple psychiatric problems.  -Patient tested positive for COVID-19, as well confirmed with her SNF facility, she had COVID-19 infection last December, as well she received her vaccine in January/February.  He was transferred to [facility] given her positive COVID-19 status.  Subjective: Patient denies any acute complaints or concerns, urinary retention this morning where she required in and out .  Assessment & Plan:   Principal Problem:   Altered mental status Active Problems:   Hypothyroidism   GERD (gastroesophageal reflux disease)   Opioid overdose ([name])   Hypotension   Acute hypoxemic respiratory failure due to COVID-19 [facility])   Acute toxic  encephalopathy -Likely related to polypharmacy  with multiple sedating medications, likely due to narcotics, there was question about patient holding her hydrocodone.  Patient denies that. -She is on multiple medication would contribute to her altered mental status, she needs to continue on these medications given her significant history for essential tremors and seizure disorder, so she is back on primidone, Depakote 10, and Seroquel -CT head with no acute findings.. -Consult PT/OT.  History tremor status post deep brain stimulator/history of seizures -She was resumed on her home medications -Condition does not appear to be related to seizures. -Urine drug screen showing positive phenobarbital  related to her taking primidone.  Acute hypoxemic respiratory failure secondary to COVID-19 pneumonia -Procalcitonin level and therefore antibiotics have been discontinued -By reviewing her history, patient had COVID-19 infection in December, and actually she did receive her Covid vaccine x2 and January/February, this was confirmed by her facility. -So I would anticipate mild case of COVID-19, now I will continue with IV steroids and remdesivir given some opacity on her imaging, but low threshold to discontinue.  Urinary retention -Required in and out this morning, will monitor closely  Hypertension -Initially hypotensive, but this has resolved, she is currently back on her home medications  Hyperglycemia in setting of type 2 diabetes -Continue with sliding scale insulin especially in setting of steroids -Started on Tradjenta -Holding home oral agents for now -Monitor carefully on carb modified diet -Hemoglobin A1c 7.4%  Thrombocytopenia -Improving, monitor closely  History of asthma -No active wheezing currently noted -Albuterol as ordered for shortness of breath or wheezing  Hypothyroidism -Resume home levothyroxine  Dyslipidemia -Resume home medications  Tardive dyskinesia -Resume home Ingrezza  Depression -Resume  homemedications  DVT prophylaxis: Lovenox  Code Status: Full code Family Communication:  Tried to call daughter, left her voicemail Disposition Plan: PT/OT consulted, likely will go back to [facility] inpatient appropriate because:IV treatments appropriate due to intensity of illness or inability to take PO   Dispo: The patient is from: SNF              Anticipated d/c is to: SNF              Anticipated d/c date is: 2 days              Patient currently is not medically stable to d/c.   Consultants:   None  Procedures:   See below  Antimicrobials:  Anti-infectives (From admission, onward)   Start     Dose/Rate Route Frequency Ordered Stop   09/12/19 1000  remdesivir 100 mg in sodium chloride 0.9 % 100 mL IVPB     100 mg 200 mL/hr over 30 Minutes Intravenous Daily 09/11/19 0727 09/16/19 0959   09/12/19 0030  azithromycin (ZITHROMAX) 500 mg in sodium chloride 0.9 % 250 mL IVPB  Status:  Discontinued    Note to Pharmacy: Pharmacy to adjust the timing of next dose   500 mg 250 mL/hr over 60 Minutes Intravenous Every 24 hours 09/11/19 0224 09/11/19 0225   09/11/19 2200  cefTRIAXone (ROCEPHIN) 1 g in sodium chloride 0.9 % 100 mL IVPB  Status:  Discontinued    Note to Pharmacy: Pharmacy to adjust timing for next dose   1 g 200 mL/hr over 30 Minutes Intravenous Every 24 hours 09/11/19 0212 09/11/19 1058   09/11/19 2200  azithromycin (ZITHROMAX) 500 mg in sodium chloride 0.9 % 250 mL IVPB  Status:  Discontinued     500 mg 250 mL/hr over 60 Minutes Intravenous Every 24 hours 09/11/19 0218 09/11/19 1058   09/11/19 0830  remdesivir 200 mg in sodium chloride 0.9% 250 mL IVPB     200 mg 580 mL/hr over 30 Minutes Intravenous Once 09/11/19 0727 09/11/19 1305   09/11/19 0030  cefTRIAXone (ROCEPHIN) 2 g in sodium chloride 0.9 % 100 mL IVPB     2 g 200 mL/hr over 30 Minutes Intravenous  Once 09/11/19 0017 09/11/19 0131   09/11/19 0030  azithromycin (ZITHROMAX) 500 mg in  sodium chloride 0.9 % 250 mL IVPB  Status:  Discontinued     500 mg 250 mL/hr over 60 Minutes Intravenous Every 24 hours 09/11/19 0017 09/11/19 0224        Objective: Vitals:   09/11/19 2024 09/11/19 2334 09/12/19 0327 09/12/19 0809  BP: 127/77 113/68 112/66 122/68  Pulse:      Resp:      Temp: 98.4 F (36.9 C) 98.6 F (37 C) 98.6 F (37 C)   TempSrc: Oral Oral Oral   SpO2:      Weight:      Height:        Intake/Output Summary (Last 24 hours) at 09/12/2019 1112 Last data filed at 09/12/2019 0700 Gross per 24 hour  Intake 240 ml  Output 800 ml  Net -560 ml   Filed Weights   09/10/19 2244 09/11/19 1537  Weight: 87.5 kg 89.1 kg    Examination:  Awake Alert, Oriented X 3, No new F.N deficits, Normal affect, she is with slow speech pattern(appears to be chronic once reviewed her neurology note). Symmetrical Chest wall movement, Good air movement bilaterally, CTAB RRR,No Gallops,Rubs or new Murmurs, No Parasternal Heave +ve B.Sounds, Abd Soft, No tenderness, No rebound - guarding or rigidity.  No Cyanosis, Clubbing or edema, No new Rash or bruise      Data Reviewed: I have personally reviewed following labs and imaging studies  CBC: Recent Labs  Lab 09/10/19 2307 09/11/19 0423 09/12/19 0444  WBC 5.6 4.8 4.5  NEUTROABS 3.2  --  3.2  HGB 11.2* 10.4* 11.1*  HCT 32.9* 31.5* 33.2*  MCV 100.9* 102.6* 101.8*  PLT 100* 87* 191*   Basic Metabolic Panel: Recent Labs  Lab 09/10/19 2307 09/11/19 0423 09/12/19 0444  NA 131* 136 138  K 4.0 4.1 3.9  CL 96* 102 103  CO2 26 27 26   GLUCOSE 157* 177* 203*  BUN 10 8 8   CREATININE 0.68 0.59 0.54  CALCIUM 8.6* 8.0* 8.6*  MG  --   --  1.9   GFR: Estimated Creatinine Clearance: 71.5 mL/min (by C-G formula based on SCr of 0.54 mg/dL). Liver Function Tests: Recent Labs  Lab 09/10/19 2307 09/12/19 0444  AST 26 14*  ALT 14 16  ALKPHOS 42 41  BILITOT 1.1 0.3  PROT 6.3* 5.4*  ALBUMIN 3.3* 2.7*   No results for  input(s): LIPASE, AMYLASE in the last 168 hours. Recent Labs  Lab 09/10/19 2322  AMMONIA 12   Coagulation Profile: No results for input(s): INR, PROTIME in the last 168 hours. Cardiac Enzymes: No results for input(s): CKTOTAL, CKMB, CKMBINDEX, TROPONINI in the last 168 hours. BNP (last 3 results) No results for input(s): PROBNP in the last 8760 hours. HbA1C: Recent Labs    09/10/19 2307  HGBA1C 7.4*   CBG: Recent Labs  Lab 09/10/19 2242 09/11/19 0748 09/11/19 1615 09/11/19 2143 09/12/19 0809  GLUCAP 148* 165* 234* 184* 167*   Lipid Profile: No results for input(s): CHOL, HDL, LDLCALC, TRIG, CHOLHDL, LDLDIRECT in the last 72 hours. Thyroid Function Tests: No results for input(s): TSH, T4TOTAL, FREET4, T3FREE, THYROIDAB in the last 72 hours. Anemia Panel: Recent Labs    09/11/19 0423 09/11/19 0423 09/11/19 0738 09/12/19 0444  VITAMINB12 316  --   --   --   FOLATE 26.0  --   --   --   FERRITIN 98   < > 106 128  TIBC 243*  --   --   --   IRON 67  --   --   --   RETICCTPCT  --   --  2.5  --    < > = values in this interval not displayed.   Sepsis Labs: Recent Labs  Lab 09/10/19 2322 09/11/19 0738  PROCALCITON  --  <0.10  LATICACIDVEN 0.9  --     Recent Results (from the past 240 hour(s))  SARS Coronavirus 2 by RT PCR (hospital order, performed in [facility] hospital lab) Nasopharyngeal Nasopharyngeal Swab     Status: Abnormal   Collection Time: 09/11/19  1:34 AM   Specimen: Nasopharyngeal Swab  Result Value Ref Range Status   SARS Coronavirus 2 POSITIVE (A) NEGATIVE Final    Comment: RESULT CALLED TO, READ BACK BY AND VERIFIED WITH: WATLINGTON,C  0328 ON 09/11/19 BY JUW (NOTE) SARS-CoV-2 target nucleic acids are DETECTED SARS-CoV-2 RNA is generally detectable in upper respiratory specimens  during the acute phase of infection.  Positive results are indicative  of the presence of the identified virus, but do not rule out bacterial infection or  co-infection with other pathogens not detected by the test.  Clinical correlation with patient history and  other diagnostic information is necessary to determine patient infection status.  The expected  result is negative. Fact Sheet for Patients:   StrictlyIdeas.no  Fact Sheet for Healthcare Providers:   BankingDealers.co.za   This test is not yet approved or cleared by the [place] FDA and  has been authorized for detection and/or diagnosis of SARS-CoV-2 by FDA under an Emergency Use Authorization (EUA).  This EUA will remain in effect (meaning this test can  be used) for the duration of  the COVID-19 declaration under Section 564(b)(1) of the Act, 21 U.S.C. section 360-bbb-3(b)(1), unless the authorization is terminated or revoked sooner. Performed at [facility], [address], [place], [place] [address]   Culture, blood (routine x 2)     Status: None (Preliminary result)   Collection Time: 09/11/19  4:23 AM   Specimen: BLOOD  Result Value Ref Range Status   Specimen Description BLOOD RIGHT ANTECUBITAL  Final   Special Requests   Final    BOTTLES DRAWN AEROBIC AND ANAEROBIC Blood Culture adequate volume   Culture   Final    NO GROWTH 1 DAY Performed at [facility], [address] [place], [place] [address]    Report Status PENDING  Incomplete         Radiology Studies: CT Head Wo Contrast  Result Date: 09/11/2019 CLINICAL DATA:  Altered mental status EXAM: CT HEAD WITHOUT CONTRAST TECHNIQUE: Contiguous axial images were obtained from the base of the skull through the vertex without intravenous contrast. COMPARISON:  CT 09/28/2014 FINDINGS: Brain: No evidence of acute infarction, hemorrhage, hydrocephalus, extra-axial collection or mass lesion/mass effect. Symmetric prominence of the ventricles, cisterns and sulci compatible with parenchymal volume loss. Patchy areas of white matter hypoattenuation are most compatible with  chronic microvascular angiopathy. Vascular: Atherosclerotic calcification of the carotid siphons. No hyperdense vessel. Skull: No calvarial fracture or suspicious osseous lesion. Small burr holes towards the vertex for stimulator leads. Coiled leads along the scalp. No scalp swelling or hematoma. Sinuses/Orbits: Paranasal sinuses and mastoid air cells are predominantly clear. Included orbital structures are unremarkable. Other: Bilateral deep brain stimulator leads terminate in stable position within the subthalamic region bilaterally. Resulting streak artifact. No evidence of lead discontinuity along the intracranial or extracranial course. IMPRESSION: 1. No acute intracranial findings. 2. Stable parenchymal volume loss and chronic microvascular angiopathy. 3. Bilateral deep brain stimulator leads terminate in stable position within the subthalamic region bilaterally. No evidence of lead discontinuity along the intracranial or extracranial course. Electronically Signed   By: Lovena Le M.D.   On: 09/11/2019 00:09   DG Chest Port 1 View  Result Date: 09/10/2019 CLINICAL DATA:  Altered mental status EXAM: PORTABLE CHEST 1 VIEW COMPARISON:  Radiograph 08/27/2019 FINDINGS: Bilateral neurostimulator generators project over the left and right chest walls without lead discontinuity. Telemetry leads overlie the chest. New opacity is seen in the periphery of the left lung base. No other focal consolidation. Low volumes with atelectasis. No pneumothorax or effusion. No frank edema. Cardiomediastinal contours are similar to prior counting for differences in technique with a calcified aorta. No acute osseous or soft tissue abnormality. IMPRESSION: New opacity in the periphery of the left lung base, concerning for pneumonia or aspiration in the appropriate clinical setting. Electronically Signed   By: Lovena Le M.D.   On: 09/10/2019 23:51        Scheduled Meds: . vitamin C  500 mg Oral Daily  . aspirin  81 mg  Oral q AM  . cholecalciferol  5,000 Units Oral q AM  . divalproex  500 mg Oral TID  .  gabapentin  300 mg Oral q AM  . gabapentin  600 mg Oral QHS  . insulin aspart  0-5 Units Subcutaneous QHS  . insulin aspart  0-9 Units Subcutaneous TID WC  . insulin detemir  0.15 Units/kg Subcutaneous BID  . levothyroxine  75 mcg Oral QAC breakfast  . linagliptin  5 mg Oral Daily  . methylPREDNISolone (SOLU-MEDROL) injection  0.5 mg/kg Intravenous Q12H  . metoprolol succinate  50 mg Oral q AM  . pantoprazole  40 mg Oral QAC breakfast  . PARoxetine  10 mg Oral Daily  . primidone  50 mg Oral q AM  . QUEtiapine  100 mg Oral QHS  . simvastatin  5 mg Oral QHS  . sodium chloride  1 g Oral BID  . Valbenazine Tosylate  40 mg Oral QHS  . zinc sulfate  220 mg Oral Daily   Continuous Infusions: . remdesivir 100 mg in NS 100 mL 100 mg (09/12/19 0958)     LOS: 1 day       Phillips Climes, MD [facility]  If 7PM-7AM, please contact night-coverage [email or web address] 09/12/2019, 11:12 AM

## 2019-09-13 ENCOUNTER — Encounter: Payer: Self-pay | Admitting: Podiatry

## 2019-09-13 DIAGNOSIS — E038 Other specified hypothyroidism: Secondary | ICD-10-CM

## 2019-09-13 DIAGNOSIS — J9601 Acute respiratory failure with hypoxia: Secondary | ICD-10-CM

## 2019-09-13 LAB — COMPREHENSIVE METABOLIC PANEL
ALT: 15 U/L (ref 0–44)
AST: 14 U/L — ABNORMAL LOW (ref 15–41)
Albumin: 3 g/dL — ABNORMAL LOW (ref 3.5–5.0)
Alkaline Phosphatase: 40 U/L (ref 38–126)
Anion gap: 9 (ref 5–15)
BUN: 14 mg/dL (ref 8–23)
CO2: 26 mmol/L (ref 22–32)
Calcium: 8.8 mg/dL — ABNORMAL LOW (ref 8.9–10.3)
Chloride: 101 mmol/L (ref 98–111)
Creatinine, Ser: 0.56 mg/dL (ref 0.44–1.00)
GFR calc Af Amer: >60 mL/min (ref >60)
GFR calc non Af Amer: >60 mL/min (ref >60)
Glucose, Bld: 190 mg/dL — ABNORMAL HIGH (ref 70–99)
Potassium: 4.3 mmol/L (ref 3.5–5.1)
Sodium: 136 mmol/L (ref 135–145)
Total Bilirubin: 0.6 mg/dL (ref 0.3–1.2)
Total Protein: 5.7 g/dL — ABNORMAL LOW (ref 6.5–8.1)

## 2019-09-13 LAB — CBC WITH DIFFERENTIAL/PLATELET
Abs Immature Granulocytes: 0.03 10*3/uL (ref 0.00–0.07)
Basophils Absolute: 0 10*3/uL (ref 0.0–0.1)
Basophils Relative: 0 %
Eosinophils Absolute: 0 10*3/uL (ref 0.0–0.5)
Eosinophils Relative: 0 %
HCT: 34.1 % — ABNORMAL LOW (ref 36.0–46.0)
Hemoglobin: 11.6 g/dL — ABNORMAL LOW (ref 12.0–15.0)
Immature Granulocytes: 1 %
Lymphocytes Relative: 23 %
Lymphs Abs: 1.2 10*3/uL (ref 0.7–4.0)
MCH: 33.7 pg (ref 26.0–34.0)
MCHC: 34 g/dL (ref 30.0–36.0)
MCV: 99.1 fL (ref 80.0–100.0)
Monocytes Absolute: 0.1 10*3/uL (ref 0.1–1.0)
Monocytes Relative: 3 %
Neutro Abs: 3.8 10*3/uL (ref 1.7–7.7)
Neutrophils Relative %: 73 %
Platelets: 114 10*3/uL — ABNORMAL LOW (ref 150–400)
RBC: 3.44 MIL/uL — ABNORMAL LOW (ref 3.87–5.11)
RDW: 12.5 % (ref 11.5–15.5)
WBC: 5.2 10*3/uL (ref 4.0–10.5)
nRBC: 0 % (ref 0.0–0.2)

## 2019-09-13 LAB — GLUCOSE, CAPILLARY
Glucose-Capillary: 190 mg/dL — ABNORMAL HIGH (ref 70–99)
Glucose-Capillary: 235 mg/dL — ABNORMAL HIGH (ref 70–99)
Glucose-Capillary: 219 mg/dL — ABNORMAL HIGH (ref 70–99)
Glucose-Capillary: 151 mg/dL — ABNORMAL HIGH (ref 70–99)

## 2019-09-13 LAB — MAGNESIUM: Magnesium: 1.7 mg/dL (ref 1.7–2.4)

## 2019-09-13 LAB — D-DIMER, QUANTITATIVE: D-Dimer, Quant: 0.28 ug/mL-FEU (ref 0.00–0.50)

## 2019-09-13 LAB — FERRITIN: Ferritin: 146 ng/mL (ref 11–307)

## 2019-09-13 LAB — C-REACTIVE PROTEIN: CRP: 0.8 mg/dL (ref <1.0)

## 2019-09-13 MED ORDER — ALBUTEROL SULFATE HFA 108 (90 BASE) MCG/ACT IN AERS: 2.0000 | INHALATION_SPRAY | Freq: Four times a day (QID) | RESPIRATORY_TRACT | Status: DC | PRN

## 2019-09-13 MED ORDER — OXYCODONE HCL 5 MG PO TABS: 5.0000 mg | ORAL_TABLET | Freq: Four times a day (QID) | ORAL | Status: DC | PRN

## 2019-09-13 MED ORDER — METHYLPREDNISOLONE SODIUM SUCC 40 MG IJ SOLR
20.0000 mg | Freq: Two times a day (BID) | INTRAMUSCULAR | Status: DC
  Administered 2019-09-13 – 2019-09-15 (×4): 20 mg via INTRAVENOUS
  Filled 2019-09-13 (×4): qty 1

## 2019-09-13 MED ORDER — FLUTICASONE FUROATE-VILANTEROL 100-25 MCG/INH IN AEPB
1.0000 | INHALATION_SPRAY | Freq: Every day | RESPIRATORY_TRACT | Status: DC
  Administered 2019-09-13 – 2019-09-15 (×3): 1 via RESPIRATORY_TRACT
  Filled 2019-09-13: qty 28

## 2019-09-13 MED ORDER — HYDROCODONE-ACETAMINOPHEN 5-325 MG PO TABS
1.0000 | ORAL_TABLET | Freq: Four times a day (QID) | ORAL | Status: DC | PRN
  Administered 2019-09-13 – 2019-09-14 (×3): 1 via ORAL
  Filled 2019-09-13 (×3): qty 1

## 2019-09-13 NOTE — Progress Notes (Signed)
Occupational Therapy Evaluation Patient Details Name: [name] MRN: [id number] DOB: December 18, 1950 Today's Date: 09/13/2019    History of Present Illness 69 y.o. female with medical history significant of hypertension, hyperlipidemia, diabetes mellitus, COPD, seizure disorder, essential tremors with deep brain stimulator, and depression is brought by EMS from [place]  for decreased responsiveness. Admitted 09/10/19 for acute toxic encephalopathy and found to be COVID positive.    Clinical Impression   Patient came from ALF where she typically requires assistance with ADLs.  She has been using wheelchair and is able to transfer self to wheelchair and BSC from bed.  Patient has cognition deficits at baseline, and today seemed close to baseline.  Able to respond appropriately, recall, and give history.  Did display poor attention and safety awareness, though.  Patient performed bed mobility with min assist and seated UB ADLs with min assist.  LB ALDs with mod assist.  Patient declining to attempt stand due to back pain (RN notified).  Will continue to follow with OT acutely to address the deficits listed below.      Follow Up Recommendations  SNF;Supervision/Assistance - 24 hour(Return to ALF - though patient said may be moving to a SNF)    Equipment Recommendations  Other (comment)(defer to next venue)    Recommendations for Other Services       Precautions / Restrictions Precautions Precautions: Fall      Mobility Bed Mobility Overal bed mobility: Needs Assistance Bed Mobility: Supine to Sit;Sit to Supine     Supine to sit: Min assist Sit to supine: Min assist   General bed mobility comments: One hand held assist to scoot and pull up right at end  Transfers Overall transfer level: Needs assistance               General transfer comment: Declined due to pain at this time    Balance Overall balance assessment: Needs assistance Sitting-balance support:  Feet supported;Bilateral upper extremity supported Sitting balance-Leahy Scale: Fair                                     ADL either performed or assessed with clinical judgement   ADL Overall ADL's : Needs assistance/impaired Eating/Feeding: Set up;Minimal assistance;Sitting   Grooming: Minimal assistance;Sitting   Upper Body Bathing: Min guard;Sitting   Lower Body Bathing: Moderate assistance;Sit to/from stand   Upper Body Dressing : Minimal assistance;Sitting   Lower Body Dressing: Moderate assistance;Sit to/from stand               Functional mobility during ADLs: Minimal assistance(bed mob) General ADL Comments: Patient declining to stand due to back pain     Vision Baseline Vision/History: Wears glasses       Perception     Praxis      Pertinent Vitals/Pain Pain Assessment: Faces Faces Pain Scale: Hurts little more Pain Location: Low back - chronic Pain Descriptors / Indicators: Aching Pain Intervention(s): Monitored during session;Repositioned;Patient requesting pain meds-RN notified     Hand Dominance Right   Extremity/Trunk Assessment Upper Extremity Assessment Upper Extremity Assessment: Generalized weakness(tremors)           Communication Communication Communication: Expressive difficulties   Cognition Arousal/Alertness: Awake/alert Behavior During Therapy: WFL for tasks assessed/performed Overall Cognitive Status: History of cognitive impairments - at baseline  General Comments: Patient's attention is poor, but other aspects of cognition seem near baseline.  Able to carry on conversation, respond to questions appropriately, give history, was oriented.   General Comments  Patient on RA throughout session and SpO2 >90.  Sometimes getting good read was tough due to her tremors, though no SOB.Marland Kitchen    Exercises     Shoulder Instructions      Home Living Family/patient expects to  be discharged to:: Assisted living                             Home Equipment: Wheelchair - manual;Bedside commode;Walker - 4 wheels          Prior Functioning/Environment Level of Independence: Needs assistance  Gait / Transfers Assistance Needed: reports she a year ago she ambulated with a Rollator, however the last year she has been transferring to [facility] and wheelchair ADL's / [place] Needed: assist for bathing and dressing, iADLs provided by facility             OT Problem List: Decreased strength;Decreased activity tolerance;Decreased coordination;Decreased cognition;Impaired UE functional use;Pain      OT Treatment/Interventions: Self-care/ADL training;Therapeutic exercise;Energy conservation;Therapeutic activities;Cognitive remediation/compensation;Patient/family education;Balance training    OT Goals(Current goals can be found in the care plan section) Acute Rehab OT Goals Patient Stated Goal: keep some independence OT Goal Formulation: With patient Time For Goal Achievement: 09/27/19 Potential to Achieve Goals: Good  OT Frequency: Min 2X/week   Barriers to D/C:            Co-evaluation              AM-PAC OT "6 Clicks" Daily Activity     Outcome Measure Help from another person eating meals?: A Little Help from another person taking care of personal grooming?: A Little Help from another person toileting, which includes using toliet, bedpan, or urinal?: A Lot Help from another person bathing (including washing, rinsing, drying)?: A Lot Help from another person to put on and taking off regular upper body clothing?: A Little Help from another person to put on and taking off regular lower body clothing?: A Lot 6 Click Score: 15   End of Session Nurse Communication: Mobility status  Activity Tolerance: Patient tolerated treatment well;Patient limited by pain Patient left: in bed;with call bell/phone within reach  OT Visit Diagnosis:  Unsteadiness on feet (R26.81);Muscle weakness (generalized) (M62.81);Ataxia, unspecified (R27.0);Other symptoms and signs involving the nervous system (R29.898);Other symptoms and signs involving cognitive function;Cognitive communication deficit (R41.841);Pain Pain - part of body: (low back)                Time: 2081-3887 OT Time Calculation (min): 26 min Charges:  OT General Charges $OT Visit: 1 Visit OT Evaluation $OT Eval Moderate Complexity: 1 Mod OT Treatments $Self Care/Home Management : 8-22 mins  August Luz, OTR/L  Phylliss Bob 09/13/2019, 1:29 PM

## 2019-09-13 NOTE — Progress Notes (Signed)
RE: Camera Krienke Date of Birth: [birth year] Date: 09/13/19  Please be advised that the above-named patient will require a short-term nursing home stay - anticipated 30 days or less for rehabilitation and strengthening.  The plan is for return home.

## 2019-09-13 NOTE — NC FL2 (Signed)
[place] LEVEL OF CARE SCREENING TOOL     IDENTIFICATION  Patient Name: [name] Birthdate: [birth year] Sex: female Admission Date (Current Location): 09/10/2019  [facility] and [place] Number:  [employer] and Address:  [facility] [place]. [facility], [place] [address], [place], [place] [address]      Provider Number: [id number]  Attending Physician Name and Address:  Jonetta Osgood, MD  Relative Name and Phone Number:  Gerald Stabs, daughter, [phone]    Current Level of Care: Hospital Recommended Level of Care: [place] Prior Approval Number:    Date Approved/Denied:   PASRR Number:    Discharge Plan: SNF    Current Diagnoses: Patient Active Problem List   Diagnosis Date Noted  . Altered mental status 09/11/2019  . Opioid overdose ([place]) 09/11/2019  . Hypotension 09/11/2019  . Acute hypoxemic respiratory failure due to COVID-19 ([place]) 09/11/2019  . Uncomplicated asthma 51/88/4166  . Skin erythema 09/28/2018  . Umbilical pain 10/04/1599  . Constipation 03/18/2017  . CAP (community acquired pneumonia) 04/21/2016  . History of colonic polyps   . Diverticulosis of colon without hemorrhage   . Other hemorrhoids   . [name]'s ring   . Mucosal abnormality of stomach   . Dysphagia   . Encounter for screening colonoscopy 02/21/2015  . Upper airway cough syndrome 01/18/2015  . OAB (overactive bladder) 12/30/2014  . Incontinence in female 12/25/2014  . MDD (major depressive disorder), recurrent severe, without psychosis ([place]) 12/14/2014  . Thrush of mouth and esophagus ([place]) 11/23/2014  . Depression with somatization 11/05/2014  . Type II diabetes mellitus with manifestations ([place]) 10/23/2014  . Visit for screening mammogram 08/21/2014  . Hypokalemia 05/30/2014  . Tinea corporis 05/30/2014  . Lumbar scoliosis 04/24/2014  . Obstructive sleep apnea 04/23/2014  . Moderate persistent asthma with acute bronchitis and  acute exacerbation 03/02/2014  . Allergy to angiotensin receptor blockers (ARB) 01/22/2014  . S/P deep brain stimulator placement 01/10/2014  . Greater trochanteric bursitis of left hip 11/07/2013  . Spinal stenosis of lumbar region 10/30/2013  . Right thyroid nodule 10/12/2013  . CMC arthritis, thumb, degenerative 06/15/2013  . GERD (gastroesophageal reflux disease) 11/03/2012  . IBS (irritable bowel syndrome) 11/03/2012  . Esophageal stenosis 11/01/2012  . Asthma, moderate persistent, poorly-controlled 09/01/2012  . Hypothyroidism 12/22/2011  . Hyperlipidemia with target LDL less than 100 06/22/2008  . Essential hypertension 09/04/2007  . TREMOR, ESSENTIAL 04/19/2007  . Seasonal and perennial allergic rhinitis 04/19/2007    Orientation RESPIRATION BLADDER Height & Weight     Self, Time, Situation, Place  Normal Continent Weight: 196 lb 6.9 oz (89.1 kg) Height:  5' 4.5" (163.8 cm)  BEHAVIORAL SYMPTOMS/MOOD NEUROLOGICAL BOWEL NUTRITION STATUS      Continent Diet(Regular diet with chopped meats)  AMBULATORY STATUS COMMUNICATION OF NEEDS Skin   Extensive Assist Verbally Normal                       Personal Care Assistance Level of Assistance  Bathing, Feeding, Dressing Bathing Assistance: Maximum assistance Feeding assistance: Limited assistance Dressing Assistance: Limited assistance     Functional Limitations Info  Sight, Hearing, Speech Sight Info: Adequate Hearing Info: Adequate Speech Info: Adequate    SPECIAL CARE FACTORS FREQUENCY  PT (By licensed PT), OT (By licensed OT)     PT Frequency: 5x/week OT Frequency: 4x/week            Contractures Contractures Info: Not present  Additional Factors Info  Isolation Precautions Code Status Info: Full Allergies Info: Invokana, Losartan, Metformin and related, Latex, Oxycodone-acetaminophen, Mucinex     Isolation Precautions Info: COVID+     Current Medications (09/13/2019):  This is the current  hospital active medication list Current Facility-Administered Medications  Medication Dose Route Frequency Provider Last Rate Last Admin  . albuterol (VENTOLIN HFA) 108 (90 Base) MCG/ACT inhaler 1-2 puff  1-2 puff Inhalation Q4H PRN Bunnie Pion Z, DO      . ascorbic acid (VITAMIN C) tablet 500 mg  500 mg Oral Daily Manuella Ghazi, Pratik D, DO   500 mg at 09/13/19 0819  . aspirin chewable tablet 81 mg  81 mg Oral q AM Manuella Ghazi, Pratik D, DO   81 mg at 09/13/19 0827  . cholecalciferol (VITAMIN D) tablet 5,000 Units  5,000 Units Oral q AM Heath Lark D, DO   5,000 Units at 09/13/19 7209  . divalproex (DEPAKOTE) DR tablet 500 mg  500 mg Oral TID Manuella Ghazi, Pratik D, DO   500 mg at 09/13/19 1523  . enoxaparin (LOVENOX) injection 40 mg  40 mg Subcutaneous Q24H Elgergawy, Silver Huguenin, MD   40 mg at 09/13/19 1216  . fluticasone furoate-vilanterol (BREO ELLIPTA) 100-25 MCG/INH 1 puff  1 puff Inhalation Daily Jonetta Osgood, MD   1 puff at 09/13/19 1400  . gabapentin (NEURONTIN) capsule 300 mg  300 mg Oral q AM Manuella Ghazi, Pratik D, DO   300 mg at 09/13/19 0830  . gabapentin (NEURONTIN) capsule 600 mg  600 mg Oral QHS Shah, Pratik D, DO   600 mg at 09/12/19 2129  . HYDROcodone-acetaminophen (NORCO/VICODIN) 5-325 MG per tablet 1 tablet  1 tablet Oral Q6H PRN Jonetta Osgood, MD   1 tablet at 09/13/19 1317  . insulin aspart (novoLOG) injection 0-5 Units  0-5 Units Subcutaneous QHS Heath Lark D, DO   2 Units at 09/12/19 2128  . insulin aspart (novoLOG) injection 0-9 Units  0-9 Units Subcutaneous TID WC Shah, Pratik D, DO   3 Units at 09/13/19 1216  . insulin aspart (novoLOG) injection 4 Units  4 Units Subcutaneous TID WC Elgergawy, Silver Huguenin, MD   4 Units at 09/13/19 1216  . insulin detemir (LEVEMIR) injection 13 Units  0.15 Units/kg Subcutaneous BID Heath Lark D, DO   13 Units at 09/13/19 [phone]  . levothyroxine (SYNTHROID) tablet 75 mcg  75 mcg Oral QAC breakfast Heath Lark D, DO   75 mcg at 09/13/19 0546  . linagliptin  (TRADJENTA) tablet 5 mg  5 mg Oral Daily Manuella Ghazi, Pratik D, DO   5 mg at 09/13/19 0820  . methylPREDNISolone sodium succinate (SOLU-MEDROL) 40 mg/mL injection 20 mg  20 mg Intravenous Q12H Ghimire, Henreitta Leber, MD      . metoprolol succinate (TOPROL-XL) 24 hr tablet 50 mg  50 mg Oral q AM Manuella Ghazi, Pratik D, DO   50 mg at 09/13/19 0827  . ondansetron (ZOFRAN) tablet 4 mg  4 mg Oral Q6H PRN Bunnie Pion Z, DO       Or  . ondansetron [facility]) injection 4 mg  4 mg Intravenous Q6H PRN Bunnie Pion Z, DO      . pantoprazole (PROTONIX) EC tablet 40 mg  40 mg Oral QAC breakfast Manuella Ghazi, Pratik D, DO   40 mg at 09/13/19 6283  . PARoxetine (PAXIL) tablet 10 mg  10 mg Oral Daily Manuella Ghazi, Pratik D, DO   10 mg at 09/13/19 0819  . primidone (MYSOLINE) tablet  50 mg  50 mg Oral q AM Manuella Ghazi, Pratik D, DO   50 mg at 09/13/19 0818  . QUEtiapine (SEROQUEL) tablet 100 mg  100 mg Oral QHS Shah, Pratik D, DO   100 mg at 09/12/19 2130  . remdesivir 100 mg in sodium chloride 0.9 % 100 mL IVPB  100 mg Intravenous Daily Manuella Ghazi, Pratik D, DO 200 mL/hr at 09/13/19 0931 100 mg at 09/13/19 0931  . simvastatin (ZOCOR) tablet 5 mg  5 mg Oral QHS Shah, Pratik D, DO   5 mg at 09/12/19 2156  . sodium chloride tablet 1 g  1 g Oral BID Manuella Ghazi, Pratik D, DO   1 g at 09/13/19 0820  . Valbenazine Tosylate CAPS 40 mg  40 mg Oral QHS Shah, Pratik D, DO   40 mg at 09/12/19 2130  . zinc sulfate capsule 220 mg  220 mg Oral Daily Heath Lark D, DO   220 mg at 09/13/19 3976     Discharge Medications: Please see discharge summary for a list of discharge medications.  Relevant Imaging Results:  Relevant Lab Results:   Additional Information SSN: [id number] [place], [place]

## 2019-09-13 NOTE — Progress Notes (Addendum)
Subjective: [name] is a pleasant 69 y.o. female patient seen today suspected trauma of L 5th toe. Pt states toenail got caught on sock and has partially come off. Toe is tender with nail still attached. She denies any fever, chills, nightsweats, nausea or vomiting.   She states her facility, [facility],  has a Podiatrist that comes and trims their nails and he comes next month for her nail care.  Patient Active Problem List   Diagnosis Date Noted  . Altered mental status 09/11/2019  . Opioid overdose ([place]) 09/11/2019  . Hypotension 09/11/2019  . Acute hypoxemic respiratory failure due to COVID-19 ([place]) 09/11/2019  . Uncomplicated asthma 40/98/1191  . Skin erythema 09/28/2018  . Umbilical pain 47/82/9562  . Constipation 03/18/2017  . CAP (community acquired pneumonia) 04/21/2016  . History of colonic polyps   . Diverticulosis of colon without hemorrhage   . Other hemorrhoids   . [name]'s ring   . Mucosal abnormality of stomach   . Dysphagia   . Encounter for screening colonoscopy 02/21/2015  . Upper airway cough syndrome 01/18/2015  . OAB (overactive bladder) 12/30/2014  . Incontinence in female 12/25/2014  . MDD (major depressive disorder), recurrent severe, without psychosis ([place]) 12/14/2014  . Thrush of mouth and esophagus ([place]) 11/23/2014  . Depression with somatization 11/05/2014  . Type II diabetes mellitus with manifestations ([place]) 10/23/2014  . Visit for screening mammogram 08/21/2014  . Hypokalemia 05/30/2014  . Tinea corporis 05/30/2014  . Lumbar scoliosis 04/24/2014  . Obstructive sleep apnea 04/23/2014  . Moderate persistent asthma with acute bronchitis and acute exacerbation 03/02/2014  . Allergy to angiotensin receptor blockers (ARB) 01/22/2014  . S/P deep brain stimulator placement 01/10/2014  . Greater trochanteric bursitis of left hip 11/07/2013  . Spinal stenosis of lumbar region 10/30/2013  . Right thyroid nodule 10/12/2013   . CMC arthritis, thumb, degenerative 06/15/2013  . GERD (gastroesophageal reflux disease) 11/03/2012  . IBS (irritable bowel syndrome) 11/03/2012  . Esophageal stenosis 11/01/2012  . Asthma, moderate persistent, poorly-controlled 09/01/2012  . Hypothyroidism 12/22/2011  . Hyperlipidemia with target LDL less than 100 06/22/2008  . Essential hypertension 09/04/2007  . TREMOR, ESSENTIAL 04/19/2007  . Seasonal and perennial allergic rhinitis 04/19/2007    No current facility-administered medications on file prior to visit.   Current Outpatient Medications on File Prior to Visit  Medication Sig Dispense Refill  . acetaminophen (TYLENOL) 500 MG tablet Take 500 mg by mouth every 4 (four) hours as needed for mild pain, fever or headache.     . albuterol (PROAIR HFA) 108 (90 Base) MCG/ACT inhaler Inhale 2 puffs every 6 hours for asthma - rescue inhaler (Patient taking differently: Inhale 2 puffs into the lungs every 6 (six) hours as needed for shortness of breath. ) 1 Inhaler 12  . albuterol (PROVENTIL) (2.5 MG/3ML) 0.083% nebulizer solution Take 3 mLs (2.5 mg total) by nebulization every 6 (six) hours as needed for wheezing or shortness of breath. 75 mL 11  . alum & mag hydroxide-simeth (MAALOX/MYLANTA) 200-200-20 MG/5ML suspension Take 30 mLs by mouth as needed for indigestion or heartburn. Max 4 doses per 24 hours    . amoxicillin-clavulanate (AUGMENTIN) 875-125 MG tablet Take 1 tablet by mouth every 12 (twelve) hours. (Patient not taking: Reported on 09/11/2019) 14 tablet 0  . Ascorbic Acid (VITAMIN C WITH ROSE HIPS) 500 MG tablet Take 500 mg by mouth in the morning.    Marland Kitchen aspirin 81 MG chewable tablet Chew 81 mg by  mouth in the morning.     . baclofen (LIORESAL) 10 MG tablet Take 10 mg by mouth at bedtime.    . Carboxymethylcellulose Sod PF (REFRESH PLUS) 0.5 % SOLN Place 1 drop into both eyes 2 (two) times daily.     . Cholecalciferol (VITAMIN D-3) 125 MCG (5000 UT) TABS Take 5,000 Units by  mouth in the morning.     Marland Kitchen dextromethorphan (DELSYM) 30 MG/5ML liquid Take 60 mg by mouth every 4 (four) hours as needed for cough.     . docusate sodium (COLACE) 100 MG capsule Take 100 mg by mouth in the morning.     Marland Kitchen ENULOSE 10 GM/15ML SOLN Take 10 g by mouth daily as needed (for mild constipation).     . fluticasone furoate-vilanterol (BREO ELLIPTA) 100-25 MCG/INH AEPB Inhale 1 puff into the lungs daily. (Patient taking differently: Inhale 1 puff into the lungs daily. RINSE MOUTH WITH WATER AFTER USE) 1 each 11  . gabapentin (NEURONTIN) 300 MG capsule Take 300 mg by mouth See admin instructions. Take 300 mg by mouth in the morning and 600 mg at bedtime    . guaifenesin (ROBITUSSIN) 100 MG/5ML syrup Take 200 mg by mouth every 6 (six) hours as needed for cough.     . hydrocortisone cream 1 % Apply 1 application topically 2 (two) times daily as needed ((hemorrhoidal flare)).     Marland Kitchen insulin glargine (LANTUS) 100 UNIT/ML injection Inject 15 Units into the skin at bedtime. *Hold if blood glucose is less than 160*    . ipratropium (ATROVENT) 0.03 % nasal spray 1-2 puffs each nostril 3 times daily, before meals (Patient taking differently: Place 1 spray into both nostrils 3 (three) times daily before meals. ) 30 mL 11  . levothyroxine (SYNTHROID, LEVOTHROID) 75 MCG tablet TAKE (1) TABLET BY MOUTH ONCE DAILY BEFORE BREAKFAST. (Patient taking differently: Take 75 mcg by mouth daily before breakfast. ) 30 tablet 5  . loperamide (IMODIUM A-D) 2 MG tablet Take 2 mg by mouth 3 (three) times daily as needed for diarrhea or loose stools.     . lubiprostone (AMITIZA) 8 MCG capsule Take 1 capsule (8 mcg total) by mouth 2 (two) times daily with a meal. (Patient taking differently: Take 8 mcg by mouth 2 (two) times daily as needed for constipation. ) 180 capsule 3  . magnesium hydroxide (MILK OF MAGNESIA) 400 MG/5ML suspension Take 30 mLs by mouth at bedtime as needed for mild constipation.     . Menthol, Topical  Analgesic, (BIOFREEZE) 4 % GEL Apply 1 application topically 2 (two) times daily. Apply to lower back and base of right thumb twice daily.    . metoprolol succinate (TOPROL-XL) 50 MG 24 hr tablet Take 50 mg by mouth in the morning. Take with or immediately following a meal.     . Multiple Vitamins-Minerals (MULTIVITAMINS THER. W/MINERALS) TABS tablet TAKE ONE TABLET BY MOUTH ONCE DAILY. (Patient taking differently: Take 1 tablet by mouth in the morning. ) 30 each 11  . neomycin-bacitracin-polymyxin (NEOSPORIN) ointment Apply 1 application topically as needed for wound care.    Marland Kitchen NOVOLOG FLEXPEN 100 UNIT/ML FlexPen Inject 8 Units into the skin 3 (three) times daily before meals.     . pantoprazole (PROTONIX) 40 MG tablet Take 40 mg by mouth daily before breakfast.     . PARoxetine (PAXIL) 10 MG tablet Take 10 mg by mouth daily.     Vladimir Faster Glycol-Propyl Glycol (SYSTANE ULTRA) 0.4-0.3 % SOLN Place  1 drop into both eyes 4 (four) times daily as needed (dry eyes).    . polyethylene glycol (MIRALAX / GLYCOLAX) packet Take 17 g by mouth daily as needed for moderate constipation. (Patient taking differently: Take 17 g by mouth 2 (two) times daily as needed for moderate constipation. ) 14 each 0  . potassium chloride SA (K-DUR,KLOR-CON) 20 MEQ tablet Take 20 mEq by mouth daily.    . predniSONE (STERAPRED UNI-PAK 21 TAB) 10 MG (21) TBPK tablet Take by mouth daily. 6, 5, 4, 3, 2, 1 take as written (Patient not taking: Reported on 09/11/2019) 21 tablet 0  . primidone (MYSOLINE) 50 MG tablet Take 50 mg by mouth in the morning.     Marland Kitchen QUEtiapine (SEROQUEL) 100 MG tablet Take 100 mg by mouth at bedtime.    . simvastatin (ZOCOR) 5 MG tablet Take 5 mg by mouth at bedtime.     . sitaGLIPtin (JANUVIA) 100 MG tablet Take 100 mg by mouth daily.    . sodium chloride (OCEAN) 0.65 % SOLN nasal spray Take 1-2 sprays in nostril every hour as needed for rhinitis (Patient taking differently: Place 1 spray into both nostrils  every hour as needed for congestion. ) 30 mL PRN  . sodium chloride 1 g tablet Take 1 g by mouth 2 (two) times daily.    Marland Kitchen zinc gluconate 50 MG tablet Take 50 mg by mouth daily.      Allergies  Allergen Reactions  . Invokana [Canagliflozin] Other (See Comments)    Yeast infectios  . Losartan Other (See Comments)    Angioedema   . Metformin And Related Diarrhea  . Latex Other (See Comments)    Other Reaction: redness, blisters  . Other Other (See Comments)    Air fresheners - on MAR  . Oxycodone-Acetaminophen Itching and Other (See Comments)    TYLOX. Caused internal itching per patient   . Mucinex [Guaifenesin Er] Other (See Comments)    MUCINEX REACTION: asthma attacks  Objective: Physical Exam  General: [name] is a pleasant 69 y.o. Caucasian female, in NAD. AAO x 3.   Vascular:  Neurovascular status unchanged b/l lower extremities. Capillary refill time to digits immediate b/l. Palpable DP pulses b/l. Palpable PT pulses b/l. Pedal hair sparse b/l. Skin temperature gradient within normal limits b/l.  Dermatological:  Pedal skin with normal turgor, texture and tone bilaterally. No open wounds bilaterally. No interdigital macerations bilaterally. Left 5th digit toenail with majority of toenail avulsed and is attached by to a small area of skin. Nailbed exposed. No erythema, no edema, no drainage. No purulence.   Remaining toenails 1-4 b/l and right 5th digit are slightly elongated and mycotic.No erythema, no edema, no drainage, no flocculence.  Musculoskeletal:  Normal muscle strength 5/5 to all lower extremity muscle groups bilaterally. No pain crepitus or joint limitation noted with ROM b/l. No gross bony deformities bilaterally.  Neurological:  Protective sensation diminished with 10g monofilament b/l.  Assessment and Plan:  1. Open wound of toe with avulsion of toenail, initial encounter   2. Diabetic peripheral neuropathy associated with type 2 diabetes  mellitus ([place])    -Examined patient. -Patient to continue soft, supportive shoe gear daily. -Patient to report any pedal injuries to medical professional immediately. -Left 5th digit toenail gently removed from its remaining attachment digit. Nailbed cleansed with wound cleanser. Triple antibiotic ointment and band-aid applied. Order written for facility to apply Mupirocin ointment to left 5th digit nailbed once daily until  healed. -She is to continue care with facility Podiatrist for her routine care . -Patient/POA to call should there be question/concern in the interim.  No follow-ups on file.  Marzetta Board, DPM

## 2019-09-13 NOTE — Progress Notes (Signed)
Inpatient Diabetes Program Recommendations  AACE/ADA: New Consensus Statement on Inpatient Glycemic Control (2015)  Target Ranges:  Prepandial:   less than 140 mg/dL      Peak postprandial:   less than 180 mg/dL (1-2 hours)      Critically ill patients:  140 - 180 mg/dL   Lab Results  Component Value Date   GLUCAP 235 (H) 09/13/2019   HGBA1C 7.4 (H) 09/10/2019  Results for [name], [name] (MRN [id number]) as of 09/13/2019 13:36  Ref. Range 09/12/2019 12:10 09/12/2019 16:43 09/12/2019 20:09 09/13/2019 07:50 09/13/2019 12:01  Glucose-Capillary Latest Ref Range: 70 - 99 mg/dL 246 (H) 236 (H) 204 (H) 190 (H) 235 (H)   Diabetes history: DM 2 Outpatient Diabetes medications:  Lantus 15 units q HS, Novolog 8 units tid with meals, Januvia 100 mg daily Current orders for Inpatient glycemic control:  Novolog sensitive tid with meals and HS Levemir 13 units bid Tradjenta 5 mg daily Solumedrol 43.75 mg bid Novolog 4 units tid with meals Inpatient Diabetes Program Recommendations:    Please consider increasing Levemir to 16 units bid.   Thanks, Adah Perl, RN, BC-ADM Inpatient Diabetes Coordinator Pager [phone] (8a-5p)

## 2019-09-13 NOTE — TOC Initial Note (Addendum)
Transition of Care [facility]) - Initial/Assessment Note    Patient Details  Name: [name] MRN: [id number] Date of Birth: 12-27-1950  Transition of Care [facility]) CM/SW Contact:    Benard Halsted, LCSW Phone Number: 09/13/2019, 5:05 PM  Clinical Narrative:                 CSW received consult for possible SNF placement at time of discharge. CSW spoke with patient regarding PT recommendation of SNF placement at time of discharge. Patient reported that patient's current ALF is currently unable to provide needed care for patient. Patient expressed understanding of PT recommendation and is agreeable to SNF placement at time of discharge. CSW provided the three COVID accepting SNFs. Patient reports preference for [facility]. CSW discussed insurance authorization process and provided Medicare SNF ratings list. Patient expressed being hopeful for rehab and to feel better soon. No further questions reported at this time. CSW awaiting a response from [facility]. Patient's pasrr has gone to Level II in person review. Patient declined for CSW to contact her daughter. CSW to continue to follow and assist with discharge planning needs.   Expected Discharge Plan: Skilled Nursing Facility Barriers to Discharge: Continued Medical Work up, [place] (PASRR), Insurance Authorization   Patient Goals and CMS Choice Patient states their goals for this hospitalization and ongoing recovery are:: Rehab CMS Medicare.gov Compare Post Acute Care list provided to:: Patient Choice offered to / list presented to : Patient  Expected Discharge Plan and Services Expected Discharge Plan: [place] In-house Referral: Clinical Social Work   Post Acute Care Choice: [place] Living arrangements for the past 2 months: [place]                                      Prior Living Arrangements/Services Living arrangements for the past 2 months: [place] Lives with:: Facility Resident Patient language and need for interpreter reviewed:: Yes Do you feel safe going back to the place where you live?: Yes      Need for Family Participation in Patient Care: No (Comment) Care giver support system in place?: Yes (comment) Current home services: DME Criminal Activity/Legal Involvement Pertinent to Current Situation/Hospitalization: No - Comment as needed  Activities of Daily Living Home Assistive Devices/Equipment: [place] Rile (specify type) ADL Screening (condition at time of admission) Patient's cognitive ability adequate to safely complete daily activities?: Yes Is the patient deaf or have difficulty hearing?: No Does the patient have difficulty seeing, even when wearing glasses/contacts?: Yes Does the patient have difficulty concentrating, remembering, or making decisions?: Yes Patient able to express need for assistance with ADLs?: Yes Does the patient have difficulty dressing or bathing?: Yes Independently performs ADLs?: No Communication: Dependent Is this a change from baseline?: Pre-admission baseline Dressing (OT): Dependent Is this a change from baseline?: Pre-admission baseline Grooming: Dependent Is this a change from baseline?: Pre-admission baseline Feeding: Independent Bathing: Needs assistance Is this a change from baseline?: Pre-admission baseline Toileting: Needs assistance Is this a change from baseline?: Pre-admission baseline In/Out Bed: Dependent Is this a change from baseline?: Pre-admission baseline Walks in Home: Dependent Is this a change from baseline?: Pre-admission baseline Does the patient have difficulty walking or climbing stairs?: Yes Weakness of Legs: Both Weakness of Arms/Hands: Both  Permission Sought/Granted Permission sought to share information with : Facility Sport and exercise psychologist Permission granted to share information with :  Yes, Verbal Permission Granted     Permission granted  to share info w AGENCY: SNFs        Emotional Assessment   Attitude/Demeanor/Rapport: Gracious, Engaged Affect (typically observed): Accepting, Appropriate, Pleasant Orientation: : Oriented to Self, Oriented to Place, Oriented to  Time, Oriented to Situation Alcohol / Substance Use: Not Applicable Psych Involvement: No (comment)  Admission diagnosis:  Hyponatremia [E87.1] Altered mental status [R41.82] Thrombocytopenia ([name]) [D69.6] Macrocytic anemia [D53.9] Altered mental status, unspecified altered mental status type [R41.82] Community acquired pneumonia of left lower lobe of lung [J18.9] Acute hypoxemic respiratory failure due to COVID-19 ([place]) [U07.1, J96.01] Patient Active Problem List   Diagnosis Date Noted  . Altered mental status 09/11/2019  . Opioid overdose ([place]) 09/11/2019  . Hypotension 09/11/2019  . Acute hypoxemic respiratory failure due to COVID-19 ([place]) 09/11/2019  . Uncomplicated asthma 41/32/4401  . Skin erythema 09/28/2018  . Umbilical pain 02/72/5366  . Constipation 03/18/2017  . CAP (community acquired pneumonia) 04/21/2016  . History of colonic polyps   . Diverticulosis of colon without hemorrhage   . Other hemorrhoids   . [name]'s ring   . Mucosal abnormality of stomach   . Dysphagia   . Encounter for screening colonoscopy 02/21/2015  . Upper airway cough syndrome 01/18/2015  . OAB (overactive bladder) 12/30/2014  . Incontinence in female 12/25/2014  . MDD (major depressive disorder), recurrent severe, without psychosis ([place]) 12/14/2014  . Thrush of mouth and esophagus ([place]) 11/23/2014  . Depression with somatization 11/05/2014  . Type II diabetes mellitus with manifestations ([place]) 10/23/2014  . Visit for screening mammogram 08/21/2014  . Hypokalemia 05/30/2014  . Tinea corporis 05/30/2014  . Lumbar scoliosis 04/24/2014  . Obstructive sleep apnea 04/23/2014  . Moderate persistent asthma with acute bronchitis and acute exacerbation 03/02/2014   . Allergy to angiotensin receptor blockers (ARB) 01/22/2014  . S/P deep brain stimulator placement 01/10/2014  . Greater trochanteric bursitis of left hip 11/07/2013  . Spinal stenosis of lumbar region 10/30/2013  . Right thyroid nodule 10/12/2013  . CMC arthritis, thumb, degenerative 06/15/2013  . GERD (gastroesophageal reflux disease) 11/03/2012  . IBS (irritable bowel syndrome) 11/03/2012  . Esophageal stenosis 11/01/2012  . Asthma, moderate persistent, poorly-controlled 09/01/2012  . Hypothyroidism 12/22/2011  . Hyperlipidemia with target LDL less than 100 06/22/2008  . Essential hypertension 09/04/2007  . TREMOR, ESSENTIAL 04/19/2007  . Seasonal and perennial allergic rhinitis 04/19/2007   PCP:  Abran Richard, MD Pharmacy:   [facility], [place] [place] [address] Phone: [phone] Fax: [phone]  [facility], [place] [address] [place] [address] Phone: [phone] Fax: [phone]     Social Determinants of Health (SDOH) Interventions    Readmission Risk Interventions Readmission Risk Prevention Plan 09/13/2019 09/11/2019  Transportation Screening - Complete  PCP or Specialist Appt within 3-5 Days Complete -  Medication Review (RN CM) - Complete  HRI or Home Care Consult Complete -  Social Work Consult for [place] Planning/Counseling Complete -  Palliative Care Screening Not Applicable -  Medication Review (RN Care Manager) Referral to Pharmacy -  Some recent data might be hidden

## 2019-09-13 NOTE — Progress Notes (Signed)
PROGRESS NOTE                                                                                                                                                                                                             Patient Demographics:    [name], is a 69 y.o. female, DOB - [birth year], ZOX:[id number]  Outpatient Primary MD for the patient is Abran Richard, MD   Admit date - 09/10/2019   LOS - 2  Chief Complaint  Patient presents with  . Fall       Brief Narrative: Patient is a 69 y.o. female with PMHx of HTN, HLD, DM-2, COPD, essential tremors, seizure disorder, chronic pain syndrome-who presented to [facility] for altered mental status secondary to polypharmacy-she was also found to have mild hypoxemia secondary to COVID-19.  She was subsequently transferred to [facility] on 6/7  Significant Events: 6/6>> presented to [facility] with acute toxic encephalopathy secondary to polypharmacy-found to have mild hypoxemia secondary to COVID-19 pneumonia. 6/7>> transferred to [facility].  Significant studies:  6/6>> CT head: No acute intracranial findings. 6/6>> chest x-ray: New opacity in the periphery of the left lung base  COVID-19 medications: Steroids: 6/6>> Remdesivir: 6/6>>  Antibiotics: Rocephin: 6/6 x 1 Zithromax: 6/6 x 1  Microbiology data: 6/7>> blood culture: Negative  DVT prophylaxis: SQ Lovenox  Procedures: None  Consults: None    Subjective:    [name] today remains without any complaints-she is not on oxygen this morning.   Assessment  & Plan :   Acute toxic encephalopathy: Related to polypharmacy-resolved-she is completely awake and alert.  She has a history of essential tremors/seizure disorder-as well she has been started back on primidone, Depakote, Seroquel, gabapentin-we will need to hold these medications if she develops any signs of sedation.  Her outpatient physicians will need  to consider down titration/minimization of polypharmacy.  Acute Hypoxic Resp Failure due to Covid 19 Viral pneumonia: Improved-hypoxia has resolved-continue remdesivir for a few more days-start titrating steroids.  Fever: afebrile Prone/Incentive Spirometry: encouraged incentive spirometry use 3-4/hour. O2 requirements:  SpO2: 95 % O2 Flow Rate (L/min): 2 L/min   COVID-19 Labs: Recent Labs    09/11/19 0738 09/12/19 0444 09/13/19 0231  DDIMER 0.64* 0.41 0.28  FERRITIN 106 128 146  CRP 4.0* 2.0* 0.8       Component Value  Date/Time   BNP 81.0 08/27/2019 1156    Recent Labs  Lab 09/11/19 0738  PROCALCITON <0.10    Lab Results  Component Value Date   SARSCOV2NAA POSITIVE (A) 09/11/2019   [place] NEGATIVE 08/27/2019   [place] NEGATIVE 12/19/2018     Thrombocytopenia: Could be secondary to viral illness-has resolved.  Acute urinary retention: Required in/out catheterization on 6/8-continue to monitor closely.  HTN: Controlled-continue metoprolol  Hypothyroidism: Continue levothyroxine  DM-2 with uncontrolled hyperglycemia secondary to steroids: CBGs still on the higher side-have decreased steroid dosing-for now continue with current dosing of Levemir 13 units daily and 4 units of NovoLog with meals along with SSI.  Follow and adjust.  Recent Labs    09/12/19 2009 09/13/19 0750 09/13/19 1201  GLUCAP 204* 190* 235*   History of bronchial asthma: Stable-continue bronchodilators.  History of chronic back pain: Reports back pain which is a chronic issue apparently was on narcotics prior to admission-we will ask pharmacy to verify this-given polypharmacy-we will need to be very cautious on resuming narcotics-but if she has been on narcotics chronically-she is at risk risk of withdrawal symptoms.  We will start low-dose hydrocodone/Tylenol-and see how she does.  History of essential tremor s/p deep brain stimulator: Continue primidone  History of seizures:  Continue Depakote, Neurontin  History of depression: Continue Seroquel/Paxil-mood appears to be pleasant this morning.  History of tardive dyskinesia: Continue valbenazine  Obesity: Estimated body mass index is 33.2 kg/m as calculated from the following:   Height as of this encounter: 5' 4.5" (1.638 m).   Weight as of this encounter: 89.1 kg.   ABG:    Component Value Date/Time   PHART 7.413 09/10/2019 2330   PCO2ART 43.8 09/10/2019 2330   PO2ART 73.8 (L) 09/10/2019 2330   HCO3 27.0 09/10/2019 2330   TCO2 25 06/19/2017 1622   ACIDBASEDEF 10.8 (H) 04/05/2013 2235   O2SAT 94.7 09/10/2019 2330    Vent Settings: N/A  Condition - Stable  Family Communication  :    Code Status :  Full Code  Diet :  Diet Order            Diet heart healthy/carb modified Room service appropriate? Yes; Fluid consistency: Thin  Diet effective now               Disposition Plan  :   Status is: Inpatient  Remains inpatient appropriate because:Inpatient level of care appropriate due to severity of illness   Dispo: The patient is from: ALF              Anticipated d/c is to: SNF              Anticipated d/c date is: 2 days              Patient currently is not medically stable to d/c.  Barriers to discharge: Complete 5 days of IV Remdesivir  Antimicorbials  :    Anti-infectives (From admission, onward)   Start     Dose/Rate Route Frequency Ordered Stop   09/12/19 1000  remdesivir 100 mg in sodium chloride 0.9 % 100 mL IVPB     100 mg 200 mL/hr over 30 Minutes Intravenous Daily 09/11/19 0727 09/16/19 0959   09/12/19 0030  azithromycin (ZITHROMAX) 500 mg in sodium chloride 0.9 % 250 mL IVPB  Status:  Discontinued    Note to Pharmacy: Pharmacy to adjust the timing of next dose   500 mg 250 mL/hr over 60 Minutes Intravenous Every 24  hours 09/11/19 0224 09/11/19 0225   09/11/19 2200  cefTRIAXone (ROCEPHIN) 1 g in sodium chloride 0.9 % 100 mL IVPB  Status:  Discontinued    Note to  Pharmacy: Pharmacy to adjust timing for next dose   1 g 200 mL/hr over 30 Minutes Intravenous Every 24 hours 09/11/19 0212 09/11/19 1058   09/11/19 2200  azithromycin (ZITHROMAX) 500 mg in sodium chloride 0.9 % 250 mL IVPB  Status:  Discontinued     500 mg 250 mL/hr over 60 Minutes Intravenous Every 24 hours 09/11/19 0218 09/11/19 1058   09/11/19 0830  remdesivir 200 mg in sodium chloride 0.9% 250 mL IVPB     200 mg 580 mL/hr over 30 Minutes Intravenous Once 09/11/19 0727 09/11/19 1305   09/11/19 0030  cefTRIAXone (ROCEPHIN) 2 g in sodium chloride 0.9 % 100 mL IVPB     2 g 200 mL/hr over 30 Minutes Intravenous  Once 09/11/19 0017 09/11/19 0131   09/11/19 0030  azithromycin (ZITHROMAX) 500 mg in sodium chloride 0.9 % 250 mL IVPB  Status:  Discontinued     500 mg 250 mL/hr over 60 Minutes Intravenous Every 24 hours 09/11/19 0017 09/11/19 0224      Inpatient Medications  Scheduled Meds: . vitamin C  500 mg Oral Daily  . aspirin  81 mg Oral q AM  . cholecalciferol  5,000 Units Oral q AM  . divalproex  500 mg Oral TID  . enoxaparin (LOVENOX) injection  40 mg Subcutaneous Q24H  . gabapentin  300 mg Oral q AM  . gabapentin  600 mg Oral QHS  . insulin aspart  0-5 Units Subcutaneous QHS  . insulin aspart  0-9 Units Subcutaneous TID WC  . insulin aspart  4 Units Subcutaneous TID WC  . insulin detemir  0.15 Units/kg Subcutaneous BID  . levothyroxine  75 mcg Oral QAC breakfast  . linagliptin  5 mg Oral Daily  . methylPREDNISolone (SOLU-MEDROL) injection  0.5 mg/kg Intravenous Q12H  . metoprolol succinate  50 mg Oral q AM  . pantoprazole  40 mg Oral QAC breakfast  . PARoxetine  10 mg Oral Daily  . primidone  50 mg Oral q AM  . QUEtiapine  100 mg Oral QHS  . simvastatin  5 mg Oral QHS  . sodium chloride  1 g Oral BID  . Valbenazine Tosylate  40 mg Oral QHS  . zinc sulfate  220 mg Oral Daily   Continuous Infusions: . remdesivir 100 mg in NS 100 mL 100 mg (09/13/19 0931)   PRN  Meds:.albuterol, ondansetron **OR** ondansetron (ZOFRAN) IV   Time Spent in minutes  25  See all Orders from today for further details   Oren Binet M.D on 09/13/2019 at 12:17 PM  To page go to [email or web address] - use universal password  [facility] -  Office  [phone]    Objective:   Vitals:   09/12/19 0809 09/12/19 1513 09/12/19 2005 09/13/19 0602  BP: 122/68 (!) 154/95 139/77 (!) 146/83  Pulse:  83 81   Resp:  14 20 15   Temp:  98.4 F (36.9 C) 98.3 F (36.8 C) 98.3 F (36.8 C)  TempSrc:  Oral Oral Oral  SpO2:  100% 99% 95%  Weight:      Height:        Wt Readings from Last 3 Encounters:  09/11/19 89.1 kg  08/27/19 87.5 kg  08/02/19 87.5 kg     Intake/Output Summary (Last 24 hours) at 09/13/2019 1217 Last  data filed at 09/13/2019 0900 Gross per 24 hour  Intake 340 ml  Output 1450 ml  Net -1110 ml     Physical Exam Gen Exam:Alert awake-not in any distress HEENT:atraumatic, normocephalic Chest: B/L clear to auscultation anteriorly CVS:S1S2 regular Abdomen:soft non tender, non distended Extremities:no edema Neurology: Non focal Skin: no rash   Data Review:    CBC Recent Labs  Lab 09/10/19 2307 09/11/19 0423 09/12/19 0444 09/13/19 0231  WBC 5.6 4.8 4.5 5.2  HGB 11.2* 10.4* 11.1* 11.6*  HCT 32.9* 31.5* 33.2* 34.1*  PLT 100* 87* 102* 114*  MCV 100.9* 102.6* 101.8* 99.1  MCH 34.4* 33.9 34.0 33.7  MCHC 34.0 33.0 33.4 34.0  RDW 12.9 13.2 12.8 12.5  LYMPHSABS 1.8  --  1.1 1.2  MONOABS 0.4  --  0.1 0.1  EOSABS 0.1  --  0.0 0.0  BASOSABS 0.0  --  0.0 0.0    Chemistries  Recent Labs  Lab 09/10/19 2307 09/11/19 0423 09/12/19 0444 09/13/19 0231  NA 131* 136 138 136  K 4.0 4.1 3.9 4.3  CL 96* 102 103 101  CO2 26 27 26 26   GLUCOSE 157* 177* 203* 190*  BUN 10 8 8 14   CREATININE 0.68 0.59 0.54 0.56  CALCIUM 8.6* 8.0* 8.6* 8.8*  MG  --   --  1.9 1.7  AST 26  --  14* 14*  ALT 14  --  16 15  ALKPHOS 42  --  41 40  BILITOT 1.1  --   0.3 0.6   ------------------------------------------------------------------------------------------------------------------ No results for input(s): CHOL, HDL, LDLCALC, TRIG, CHOLHDL, LDLDIRECT in the last 72 hours.  Lab Results  Component Value Date   HGBA1C 7.4 (H) 09/10/2019   ------------------------------------------------------------------------------------------------------------------ No results for input(s): TSH, T4TOTAL, T3FREE, THYROIDAB in the last 72 hours.  Invalid input(s): FREET3 ------------------------------------------------------------------------------------------------------------------ Recent Labs    09/11/19 0423 09/11/19 0423 09/11/19 2500 09/11/19 3704 09/12/19 0444 09/13/19 0231  [id number] 316  --   --   --   --   --   FOLATE 26.0  --   --   --   --   --   FERRITIN 98   < > 106   < > 128 146  TIBC 243*  --   --   --   --   --   IRON 67  --   --   --   --   --   RETICCTPCT  --   --  2.5  --   --   --    < > = values in this interval not displayed.    Coagulation profile No results for input(s): INR, PROTIME in the last 168 hours.  Recent Labs    09/12/19 0444 09/13/19 0231  DDIMER 0.41 0.28    Cardiac Enzymes No results for input(s): CKMB, TROPONINI, MYOGLOBIN in the last 168 hours.  Invalid input(s): CK ------------------------------------------------------------------------------------------------------------------    Component Value Date/Time   BNP 81.0 08/27/2019 1156    Micro Results Recent Results (from the past 240 hour(s))  SARS Coronavirus 2 by RT PCR (hospital order, performed in [facility] hospital lab) Nasopharyngeal Nasopharyngeal Swab     Status: Abnormal   Collection Time: 09/11/19  1:34 AM   Specimen: Nasopharyngeal Swab  Result Value Ref Range Status   SARS Coronavirus 2 POSITIVE (A) NEGATIVE Final    Comment: RESULT CALLED TO, READ BACK BY AND VERIFIED WITH: [place],C  [id number] ON 09/11/19 BY  JUW (NOTE) SARS-CoV-2 target  nucleic acids are DETECTED SARS-CoV-2 RNA is generally detectable in upper respiratory specimens  during the acute phase of infection.  Positive results are indicative  of the presence of the identified virus, but do not rule out bacterial infection or co-infection with other pathogens not detected by the test.  Clinical correlation with patient history and  other diagnostic information is necessary to determine patient infection status.  The expected result is negative. Fact Sheet for Patients:   StrictlyIdeas.no  Fact Sheet for Healthcare Providers:   BankingDealers.co.za   This test is not yet approved or cleared by the [place] FDA and  has been authorized for detection and/or diagnosis of SARS-CoV-2 by FDA under an Emergency Use Authorization (EUA).  This EUA will remain in effect (meaning this test can  be used) for the duration of  the COVID-19 declaration under Section 564(b)(1) of the Act, 21 U.S.C. section 360-bbb-3(b)(1), unless the authorization is terminated or revoked sooner. Performed at [facility], [address] [place], [place] [address]   Culture, blood (routine x 2)     Status: None (Preliminary result)   Collection Time: 09/11/19  4:23 AM   Specimen: BLOOD  Result Value Ref Range Status   Specimen Description BLOOD RIGHT ANTECUBITAL  Final   Special Requests   Final    BOTTLES DRAWN AEROBIC AND ANAEROBIC Blood Culture adequate volume   Culture   Final    NO GROWTH 2 DAYS Performed at [facility], [address], [place], [place] [address]    Report Status PENDING  Incomplete    Radiology Reports CT Head Wo Contrast  Result Date: 09/11/2019 CLINICAL DATA:  Altered mental status EXAM: CT HEAD WITHOUT CONTRAST TECHNIQUE: Contiguous axial images were obtained from the base of the skull through the vertex without intravenous contrast. COMPARISON:  CT 09/28/2014 FINDINGS: Brain: No  evidence of acute infarction, hemorrhage, hydrocephalus, extra-axial collection or mass lesion/mass effect. Symmetric prominence of the ventricles, cisterns and sulci compatible with parenchymal volume loss. Patchy areas of white matter hypoattenuation are most compatible with chronic microvascular angiopathy. Vascular: Atherosclerotic calcification of the carotid siphons. No hyperdense vessel. Skull: No calvarial fracture or suspicious osseous lesion. Small burr holes towards the vertex for stimulator leads. Coiled leads along the scalp. No scalp swelling or hematoma. Sinuses/Orbits: Paranasal sinuses and mastoid air cells are predominantly clear. Included orbital structures are unremarkable. Other: Bilateral deep brain stimulator leads terminate in stable position within the subthalamic region bilaterally. Resulting streak artifact. No evidence of lead discontinuity along the intracranial or extracranial course. IMPRESSION: 1. No acute intracranial findings. 2. Stable parenchymal volume loss and chronic microvascular angiopathy. 3. Bilateral deep brain stimulator leads terminate in stable position within the subthalamic region bilaterally. No evidence of lead discontinuity along the intracranial or extracranial course. Electronically Signed   By: Lovena Le M.D.   On: 09/11/2019 00:09   DG Chest Port 1 View  Result Date: 09/10/2019 CLINICAL DATA:  Altered mental status EXAM: PORTABLE CHEST 1 VIEW COMPARISON:  Radiograph 08/27/2019 FINDINGS: Bilateral neurostimulator generators project over the left and right chest walls without lead discontinuity. Telemetry leads overlie the chest. New opacity is seen in the periphery of the left lung base. No other focal consolidation. Low volumes with atelectasis. No pneumothorax or effusion. No frank edema. Cardiomediastinal contours are similar to prior counting for differences in technique with a calcified aorta. No acute osseous or soft tissue abnormality. IMPRESSION:  New opacity in the periphery of the left lung base, concerning  for pneumonia or aspiration in the appropriate clinical setting. Electronically Signed   By: Lovena Le M.D.   On: 09/10/2019 23:51   DG Chest Port 1 View  Result Date: 08/27/2019 CLINICAL DATA:  Dyspnea EXAM: PORTABLE CHEST 1 VIEW COMPARISON:  08/02/2019 FINDINGS: The heart size and mediastinal contours are within normal limits. Both lungs are clear. The visualized skeletal structures are unremarkable. IMPRESSION: No acute abnormality of the lungs in AP portable projection. Electronically Signed   By: Eddie Candle M.D.   On: 08/27/2019 13:17

## 2019-09-14 LAB — COMPREHENSIVE METABOLIC PANEL
ALT: 12 U/L (ref 0–44)
AST: 10 U/L — ABNORMAL LOW (ref 15–41)
Albumin: 2.8 g/dL — ABNORMAL LOW (ref 3.5–5.0)
Alkaline Phosphatase: 37 U/L — ABNORMAL LOW (ref 38–126)
Anion gap: 8 (ref 5–15)
BUN: 15 mg/dL (ref 8–23)
CO2: 30 mmol/L (ref 22–32)
Calcium: 8.8 mg/dL — ABNORMAL LOW (ref 8.9–10.3)
Chloride: 96 mmol/L — ABNORMAL LOW (ref 98–111)
Creatinine, Ser: 0.8 mg/dL (ref 0.44–1.00)
GFR calc Af Amer: >60 mL/min (ref >60)
GFR calc non Af Amer: >60 mL/min (ref >60)
Glucose, Bld: 231 mg/dL — ABNORMAL HIGH (ref 70–99)
Potassium: 4.2 mmol/L (ref 3.5–5.1)
Sodium: 134 mmol/L — ABNORMAL LOW (ref 135–145)
Total Bilirubin: 0.5 mg/dL (ref 0.3–1.2)
Total Protein: 5.3 g/dL — ABNORMAL LOW (ref 6.5–8.1)

## 2019-09-14 LAB — CBC WITH DIFFERENTIAL/PLATELET
Abs Immature Granulocytes: 0.04 10*3/uL (ref 0.00–0.07)
Basophils Absolute: 0 10*3/uL (ref 0.0–0.1)
Basophils Relative: 0 %
Eosinophils Absolute: 0 10*3/uL (ref 0.0–0.5)
Eosinophils Relative: 0 %
HCT: 33.3 % — ABNORMAL LOW (ref 36.0–46.0)
Hemoglobin: 11.6 g/dL — ABNORMAL LOW (ref 12.0–15.0)
Immature Granulocytes: 1 %
Lymphocytes Relative: 34 %
Lymphs Abs: 1.7 10*3/uL (ref 0.7–4.0)
MCH: 34 pg (ref 26.0–34.0)
MCHC: 34.8 g/dL (ref 30.0–36.0)
MCV: 97.7 fL (ref 80.0–100.0)
Monocytes Absolute: 0.2 10*3/uL (ref 0.1–1.0)
Monocytes Relative: 4 %
Neutro Abs: 3.1 10*3/uL (ref 1.7–7.7)
Neutrophils Relative %: 61 %
Platelets: 121 10*3/uL — ABNORMAL LOW (ref 150–400)
RBC: 3.41 MIL/uL — ABNORMAL LOW (ref 3.87–5.11)
RDW: 12.4 % (ref 11.5–15.5)
WBC: 5.1 10*3/uL (ref 4.0–10.5)
nRBC: 0 % (ref 0.0–0.2)

## 2019-09-14 LAB — GLUCOSE, CAPILLARY
Glucose-Capillary: 191 mg/dL — ABNORMAL HIGH (ref 70–99)
Glucose-Capillary: 267 mg/dL — ABNORMAL HIGH (ref 70–99)
Glucose-Capillary: 240 mg/dL — ABNORMAL HIGH (ref 70–99)
Glucose-Capillary: 113 mg/dL — ABNORMAL HIGH (ref 70–99)

## 2019-09-14 LAB — C-REACTIVE PROTEIN: CRP: 0.6 mg/dL (ref <1.0)

## 2019-09-14 LAB — D-DIMER, QUANTITATIVE: D-Dimer, Quant: <0.27 ug/mL-FEU (ref 0.00–0.50)

## 2019-09-14 LAB — MAGNESIUM: Magnesium: 1.7 mg/dL (ref 1.7–2.4)

## 2019-09-14 LAB — FERRITIN: Ferritin: 120 ng/mL (ref 11–307)

## 2019-09-14 MED ORDER — HYDROCODONE-ACETAMINOPHEN 5-325 MG PO TABS
1.0000 | ORAL_TABLET | Freq: Three times a day (TID) | ORAL | Status: DC | PRN
  Administered 2019-09-14 – 2019-09-15 (×2): 1 via ORAL
  Filled 2019-09-14 (×2): qty 1

## 2019-09-14 MED ORDER — MAGNESIUM SULFATE 2 GM/50ML IV SOLN
2.0000 g | Freq: Once | INTRAVENOUS | Status: AC
  Administered 2019-09-14: 2 g via INTRAVENOUS
  Filled 2019-09-14: qty 50

## 2019-09-14 NOTE — Progress Notes (Signed)
Physical Therapy Treatment Patient Details Name: [name] MRN: [id number] DOB: July 22, 1950 Today's Date: 09/14/2019    History of Present Illness 69 y.o. female with medical history significant of hypertension, hyperlipidemia, diabetes mellitus, COPD, seizure disorder, essential tremors with deep brain stimulator, and depression is brought by EMS from [place]  for decreased responsiveness. Admitted 09/10/19 for acute toxic encephalopathy and found to be COVID positive.     PT Comments    Patient limited to bed to chair for transitions due to fear of falling with history of falls.  For activity progression will need to work on w/c mobility and possibly UE ergometer.  PT to follow up and progress as tolerated.     Follow Up Recommendations  SNF     Equipment Recommendations  None recommended by PT    Recommendations for Other Services       Precautions / Restrictions Precautions Precautions: Fall    Mobility  Bed Mobility Overal bed mobility: Needs Assistance Bed Mobility: Supine to Sit     Supine to sit: Min assist     General bed mobility comments: reaching for a hand hold to come up to sit, pt in wet bed so assisted getting gown changed at EOB  Transfers Overall transfer level: Needs assistance Equipment used: Rolling walker (2 wheeled) Transfers: Sit to/from [facility] Sit to Stand: Min assist Stand pivot transfers: Mod assist       General transfer comment: up with A for balance pt with tremors/ataxia, step to chair with RW and A to manage walker and for balance with gait belt; A to wash perineal area in standing  Ambulation/Gait             General Gait Details: too fearful to try ambulation due to history of falls   Stairs             Wheelchair Mobility    Modified Rankin (Stroke Patients Only)       Balance Overall balance assessment: Needs assistance Sitting-balance support: Feet  supported Sitting balance-Leahy Scale: Fair Sitting balance - Comments: stable sitting EOB   Standing balance support: Bilateral upper extremity supported;During functional activity Standing balance-Leahy Scale: Poor Standing balance comment: min A for safety with RW for balance statically, mod A for dynamic balance                            Cognition Arousal/Alertness: Awake/alert Behavior During Therapy: WFL for tasks assessed/performed Overall Cognitive Status: History of cognitive impairments - at baseline                                 General Comments: No major issues noted, she has decreased attention and has slower processing, but remembers about call from case manager planned for this pm and is oriented, responses to commands/questions [facility]      Exercises      General Comments General comments (skin integrity, edema, etc.): on RA during session, SpO2 probe repositioned on her finger with 94% reading, but due to tremors inconsistent readings      Pertinent Vitals/Pain Pain Assessment: No/denies pain    Home Living                      Prior Function            PT Goals (current goals can now  be found in the care plan section) Progress towards PT goals: Progressing toward goals    Frequency    Min 3X/week      PT Plan Current plan remains appropriate    Co-evaluation              AM-PAC PT "6 Clicks" Mobility   Outcome Measure  Help needed turning from your back to your side while in a flat bed without using bedrails?: A Little Help needed moving from lying on your back to sitting on the side of a flat bed without using bedrails?: A Little Help needed moving to and from a bed to a chair (including a wheelchair)?: A Lot Help needed standing up from a chair using your arms (e.g., wheelchair or bedside chair)?: A Lot Help needed to walk in hospital room?: Total Help needed climbing 3-5 steps with a railing? :  Total 6 Click Score: 12    End of Session Equipment Utilized During Treatment: Gait belt Activity Tolerance: Patient tolerated treatment well Patient left: in chair;with call bell/phone within reach;with chair alarm set   PT Visit Diagnosis: Other abnormalities of gait and mobility (R26.89);Repeated falls (R29.6);Muscle weakness (generalized) (M62.81);History of falling (Z91.81);Ataxic gait (R26.0)     Time: 1410-1436 PT Time Calculation (min) (ACUTE ONLY): 26 min  Charges:  $Therapeutic Activity: 23-37 mins                     Magda Kiel, PT Acute Rehabilitation Services SIDXF:[phone] Office:[phone] 09/14/2019    Reginia Naas 09/14/2019, 6:03 PM

## 2019-09-14 NOTE — TOC Progression Note (Signed)
Transition of Care [facility]) - Progression Note    Patient Details  Name: [name] MRN: [id number] Date of Birth: 05/12/1950  [facility] CM/SW [place], LCSW Phone Number: 09/14/2019, 2:48 PM  Clinical Narrative:    CSW spoke with Pasrr, B. Akins. She will complete assessment with patient over the phone this afternoon. CSW faxed clinicals to insurance. Still no COVID SNF bed offers; awaiting response from [place]. [place] has declined.    Expected Discharge Plan: Skilled Nursing Facility Barriers to Discharge: Continued Medical Work up, [place] ([place]), Insurance Authorization  Expected Discharge Plan and Services Expected Discharge Plan: [place] In-house Referral: Clinical Social Work   Post Acute Care Choice: [place] Living arrangements for the past 2 months: [place]                                       Social Determinants of Health (SDOH) Interventions    Readmission Risk Interventions Readmission Risk Prevention Plan 09/13/2019 09/11/2019  Transportation Screening - Complete  PCP or Specialist Appt within 3-5 Days Complete -  Medication Review (RN CM) - Complete  HRI or Home Care Consult Complete -  Social Work Consult for Lead Planning/Counseling Complete -  Palliative Care Screening Not Applicable -  Medication Review (RN Care Manager) Referral to Pharmacy -  Some recent data might be hidden

## 2019-09-14 NOTE — Plan of Care (Signed)
  Problem: Clinical Measurements: Goal: Ability to maintain clinical measurements within normal limits will improve Outcome: Progressing Goal: Will remain free from infection Outcome: Progressing Goal: Diagnostic test results will improve Outcome: Progressing Goal: Respiratory complications will improve Outcome: Progressing   Problem: Coping: Goal: Level of anxiety will decrease Outcome: Progressing

## 2019-09-14 NOTE — Progress Notes (Signed)
PROGRESS NOTE                                                                                                                                                                                                             Patient Demographics:    [name], is a 69 y.o. female, DOB - 03-Jan-1951, ZOX:[id number]  Outpatient Primary MD for the patient is Abran Richard, MD   Admit date - 09/10/2019   LOS - 3  Chief Complaint  Patient presents with  . Fall       Brief Narrative: Patient is a 69 y.o. female with PMHx of HTN, HLD, DM-2, COPD, essential tremors, seizure disorder, chronic pain syndrome-who presented to [facility] for altered mental status secondary to polypharmacy-she was also found to have mild hypoxemia secondary to COVID-19.  She was subsequently transferred to [facility] on 6/7  Significant Events: 6/6>> presented to [facility] with acute toxic encephalopathy secondary to polypharmacy-found to have mild hypoxemia secondary to COVID-19 pneumonia. 6/7>> transferred to [facility].  Significant studies:  6/6>> CT head: No acute intracranial findings. 6/6>> chest x-ray: New opacity in the periphery of the left lung base  COVID-19 medications: Steroids: 6/6>> Remdesivir: 6/6>>  Antibiotics: Rocephin: 6/6 x 1 Zithromax: 6/6 x 1  Microbiology data: 6/7>> blood culture: Negative  DVT prophylaxis: SQ Lovenox  Procedures: None  Consults: None    Subjective:   Stable overnight-no major issues-no nausea/vomiting.  Chronic back pain is stable.   Assessment  & Plan :   Acute toxic encephalopathy: Related to polypharmacy-resolved-she is completely awake and alert.  She has a history of essential tremors/seizure disorder-as well she has [name] started back on primidone, Depakote, Seroquel, gabapentin-we will need to hold these medications if she develops any signs of sedation.  Her outpatient physicians will need to  consider down titration/minimization of polypharmacy.  Acute Hypoxic Resp Failure due to Covid 19 Viral pneumonia: Improved-hypoxia has resolved-we complete 5 days of remdesivir on 6/11-steroids will be stopped on 6/11.  Fever: afebrile Prone/Incentive Spirometry: encouraged incentive spirometry use 3-4/hour. O2 requirements:  SpO2: 96 % O2 Flow Rate (L/min): 2 L/min   COVID-19 Labs: Recent Labs    09/12/19 0444 09/13/19 0231 09/14/19 0335  DDIMER 0.41 0.28 <0.27  FERRITIN 128 146 120  CRP 2.0* 0.8 0.6       Component  Value Date/Time   BNP 81.0 08/27/2019 1156    Recent Labs  Lab 09/11/19 0738  PROCALCITON <0.10    Lab Results  Component Value Date   SARSCOV2NAA POSITIVE (A) 09/11/2019   [place] NEGATIVE 08/27/2019   [place] NEGATIVE 12/19/2018     Thrombocytopenia: Could be secondary to viral illness-has resolved.  Acute urinary retention: Required in/out catheterization on 6/8-continue to monitor closely.  HTN: Controlled-continue metoprolol  Hypothyroidism: Continue levothyroxine  DM-2 with uncontrolled hyperglycemia secondary to steroids: CBGs relatively stable-suspected it will continue to improve now that steroid dosage has [name] decreased significantly-continue Levemir 13 units daily, Fornage of NovoLog with meals and SSI.  Follow and adjust.   Recent Labs    09/13/19 1644 09/13/19 2116 09/14/19 0739  GLUCAP 219* 151* 191*   History of bronchial asthma: Stable-continue bronchodilators.  History of chronic back pain: Reports back pain which is a chronic issue apparently was on narcotics prior to admission-verified with pharmacy-she has [name] on hydrocodone/Tylenol 5/325 3 times a day-have restarted narcotics as needed-given risk of withdrawal symptoms.  Her outpatient physicians will need to consider slow titration-and subsequently discontinue.  History of essential tremor s/p deep brain stimulator: Continue primidone  History of seizures:  Continue Depakote, Neurontin  History of depression: Continue Seroquel/Paxil-mood appears to be pleasant this morning.  History of tardive dyskinesia: Continue valbenazine  Obesity: Estimated body mass index is 33.2 kg/m as calculated from the following:   Height as of this encounter: 5' 4.5" (1.638 m).   Weight as of this encounter: 89.1 kg.   ABG:    Component Value Date/Time   PHART 7.413 09/10/2019 2330   PCO2ART 43.8 09/10/2019 2330   PO2ART 73.8 (L) 09/10/2019 2330   HCO3 27.0 09/10/2019 2330   TCO2 25 06/19/2017 1622   ACIDBASEDEF 10.8 (H) 04/05/2013 2235   O2SAT 94.7 09/10/2019 2330    Vent Settings: N/A  Condition - Stable  Family Communication  : Left a voicemail for daughter on 6/10  Code Status :  Full Code  Diet :  Diet Order            Diet heart healthy/carb modified Room service appropriate? Yes; Fluid consistency: Thin  Diet effective now                  Disposition Plan  :   Status is: Inpatient  Remains inpatient appropriate because:Inpatient level of care appropriate due to severity of illness   Dispo: The patient is from: ALF              Anticipated d/c is to: SNF              Anticipated d/c date is: 2 days              Patient currently is not medically stable to d/c.  Barriers to discharge: Complete 5 days of IV Remdesivir  Antimicorbials  :    Anti-infectives (From admission, onward)   Start     Dose/Rate Route Frequency Ordered Stop   09/12/19 1000  remdesivir 100 mg in sodium chloride 0.9 % 100 mL IVPB     Discontinue    "Followed by" Linked Group Details   100 mg 200 mL/hr over 30 Minutes Intravenous Daily 09/11/19 0727 09/16/19 0959   09/12/19 0030  azithromycin (ZITHROMAX) 500 mg in sodium chloride 0.9 % 250 mL IVPB  Status:  Discontinued       Note to Pharmacy: Pharmacy to adjust the timing of next  dose   500 mg 250 mL/hr over 60 Minutes Intravenous Every 24 hours 09/11/19 0224 09/11/19 0225   09/11/19 2200   cefTRIAXone (ROCEPHIN) 1 g in sodium chloride 0.9 % 100 mL IVPB  Status:  Discontinued       Note to Pharmacy: Pharmacy to adjust timing for next dose   1 g 200 mL/hr over 30 Minutes Intravenous Every 24 hours 09/11/19 0212 09/11/19 1058   09/11/19 2200  azithromycin (ZITHROMAX) 500 mg in sodium chloride 0.9 % 250 mL IVPB  Status:  Discontinued        500 mg 250 mL/hr over 60 Minutes Intravenous Every 24 hours 09/11/19 0218 09/11/19 1058   09/11/19 0830  remdesivir 200 mg in sodium chloride 0.9% 250 mL IVPB       "Followed by" Linked Group Details   200 mg 580 mL/hr over 30 Minutes Intravenous Once 09/11/19 0727 09/11/19 1305   09/11/19 0030  cefTRIAXone (ROCEPHIN) 2 g in sodium chloride 0.9 % 100 mL IVPB        2 g 200 mL/hr over 30 Minutes Intravenous  Once 09/11/19 0017 09/11/19 0131   09/11/19 0030  azithromycin (ZITHROMAX) 500 mg in sodium chloride 0.9 % 250 mL IVPB  Status:  Discontinued        500 mg 250 mL/hr over 60 Minutes Intravenous Every 24 hours 09/11/19 0017 09/11/19 0224      Inpatient Medications  Scheduled Meds: . vitamin C  500 mg Oral Daily  . aspirin  81 mg Oral q AM  . cholecalciferol  5,000 Units Oral q AM  . divalproex  500 mg Oral TID  . enoxaparin (LOVENOX) injection  40 mg Subcutaneous Q24H  . fluticasone furoate-vilanterol  1 puff Inhalation Daily  . gabapentin  300 mg Oral q AM  . gabapentin  600 mg Oral QHS  . insulin aspart  0-5 Units Subcutaneous QHS  . insulin aspart  0-9 Units Subcutaneous TID WC  . insulin aspart  4 Units Subcutaneous TID WC  . insulin detemir  0.15 Units/kg Subcutaneous BID  . levothyroxine  75 mcg Oral QAC breakfast  . linagliptin  5 mg Oral Daily  . methylPREDNISolone (SOLU-MEDROL) injection  20 mg Intravenous Q12H  . metoprolol succinate  50 mg Oral q AM  . pantoprazole  40 mg Oral QAC breakfast  . PARoxetine  10 mg Oral Daily  . primidone  50 mg Oral q AM  . QUEtiapine  100 mg Oral QHS  . simvastatin  5 mg Oral QHS    . sodium chloride  1 g Oral BID  . Valbenazine Tosylate  40 mg Oral QHS  . zinc sulfate  220 mg Oral Daily   Continuous Infusions: . remdesivir 100 mg in NS 100 mL 100 mg (09/14/19 0944)   PRN Meds:.albuterol, HYDROcodone-acetaminophen, ondansetron **OR** ondansetron (ZOFRAN) IV   Time Spent in minutes  25  See all Orders from today for further details   Oren Binet M.D on 09/14/2019 at 11:28 AM  To page go to [email or web address] - use universal password  [facility] -  Office  [phone]    Objective:   Vitals:   09/13/19 0602 09/13/19 1421 09/13/19 2000 09/14/19 0539  BP: (!) 146/83 (!) 151/76  136/67  Pulse:  74  63  Resp: 15 14  15   Temp: 98.3 F (36.8 C) 98.5 F (36.9 C) 98.4 F (36.9 C) 98.3 F (36.8 C)  TempSrc: Oral Oral Oral Oral  SpO2: 95% 97%  96%  Weight:      Height:        Wt Readings from Last 3 Encounters:  09/11/19 89.1 kg  08/27/19 87.5 kg  08/02/19 87.5 kg     Intake/Output Summary (Last 24 hours) at 09/14/2019 1128 Last data filed at 09/14/2019 0540 Gross per 24 hour  Intake 600 ml  Output 1950 ml  Net -1350 ml     Physical Exam Gen Exam:Alert awake-not in any distress HEENT:atraumatic, normocephalic Chest: B/L clear to auscultation anteriorly CVS:S1S2 regular Abdomen:soft non tender, non distended Extremities:no edema Neurology: Non focal Skin: no rash   Data Review:    CBC Recent Labs  Lab 09/10/19 2307 09/11/19 0423 09/12/19 0444 09/13/19 0231 09/14/19 0335  WBC 5.6 4.8 4.5 5.2 5.1  HGB 11.2* 10.4* 11.1* 11.6* 11.6*  HCT 32.9* 31.5* 33.2* 34.1* 33.3*  PLT 100* 87* 102* 114* 121*  MCV 100.9* 102.6* 101.8* 99.1 97.7  MCH 34.4* 33.9 34.0 33.7 34.0  MCHC 34.0 33.0 33.4 34.0 34.8  RDW 12.9 13.2 12.8 12.5 12.4  LYMPHSABS 1.8  --  1.1 1.2 1.7  MONOABS 0.4  --  0.1 0.1 0.2  EOSABS 0.1  --  0.0 0.0 0.0  BASOSABS 0.0  --  0.0 0.0 0.0    Chemistries  Recent Labs  Lab 09/10/19 2307 09/11/19 0423  09/12/19 0444 09/13/19 0231 09/14/19 0335  NA 131* 136 138 136 134*  K 4.0 4.1 3.9 4.3 4.2  CL 96* 102 103 101 96*  CO2 26 27 26 26 30   GLUCOSE 157* 177* 203* 190* 231*  BUN 10 8 8 14 15   CREATININE 0.68 0.59 0.54 0.56 0.80  CALCIUM 8.6* 8.0* 8.6* 8.8* 8.8*  MG  --   --  1.9 1.7 1.7  AST 26  --  14* 14* 10*  ALT 14  --  16 15 12   ALKPHOS 42  --  41 40 37*  BILITOT 1.1  --  0.3 0.6 0.5   ------------------------------------------------------------------------------------------------------------------ No results for input(s): CHOL, HDL, LDLCALC, TRIG, CHOLHDL, LDLDIRECT in the last 72 hours.  Lab Results  Component Value Date   HGBA1C 7.4 (H) 09/10/2019   ------------------------------------------------------------------------------------------------------------------ No results for input(s): TSH, T4TOTAL, T3FREE, THYROIDAB in the last 72 hours.  Invalid input(s): FREET3 ------------------------------------------------------------------------------------------------------------------ Recent Labs    09/13/19 0231 09/14/19 0335  FERRITIN 146 120    Coagulation profile No results for input(s): INR, PROTIME in the last 168 hours.  Recent Labs    09/13/19 0231 09/14/19 0335  DDIMER 0.28 <0.27    Cardiac Enzymes No results for input(s): CKMB, TROPONINI, MYOGLOBIN in the last 168 hours.  Invalid input(s): CK ------------------------------------------------------------------------------------------------------------------    Component Value Date/Time   BNP 81.0 08/27/2019 1156    Micro Results Recent Results (from the past 240 hour(s))  SARS Coronavirus 2 by RT PCR (hospital order, performed in [facility] hospital lab) Nasopharyngeal Nasopharyngeal Swab     Status: Abnormal   Collection Time: 09/11/19  1:34 AM   Specimen: Nasopharyngeal Swab  Result Value Ref Range Status   SARS Coronavirus 2 POSITIVE (A) NEGATIVE Final    Comment: RESULT CALLED TO, READ BACK BY  AND VERIFIED WITH: [place],C  0328 ON 09/11/19 BY JUW (NOTE) SARS-CoV-2 target nucleic acids are DETECTED SARS-CoV-2 RNA is generally detectable in upper respiratory specimens  during the acute phase of infection.  Positive results are indicative  of the presence of the identified virus, but do not rule out bacterial infection or co-infection with  other pathogens not detected by the test.  Clinical correlation with patient history and  other diagnostic information is necessary to determine patient infection status.  The expected result is negative. Fact Sheet for Patients:   StrictlyIdeas.no  Fact Sheet for Healthcare Providers:   BankingDealers.co.za   This test is not yet approved or cleared by the [place] FDA and  has [name] authorized for detection and/or diagnosis of SARS-CoV-2 by FDA under an Emergency Use Authorization (EUA).  This EUA will remain in effect (meaning this test can  be used) for the duration of  the COVID-19 declaration under Section 564(b)(1) of the Act, 21 U.S.C. section 360-bbb-3(b)(1), unless the authorization is terminated or revoked sooner. Performed at [facility], [address], [place], [place] [address]   Culture, blood (routine x 2)     Status: None (Preliminary result)   Collection Time: 09/11/19  4:23 AM   Specimen: BLOOD  Result Value Ref Range Status   Specimen Description BLOOD RIGHT ANTECUBITAL  Final   Special Requests   Final    BOTTLES DRAWN AEROBIC AND ANAEROBIC Blood Culture adequate volume   Culture   Final    NO GROWTH 3 DAYS Performed at [facility], [address] [place], [place] [address]    Report Status PENDING  Incomplete    Radiology Reports CT Head Wo Contrast  Result Date: 09/11/2019 CLINICAL DATA:  Altered mental status EXAM: CT HEAD WITHOUT CONTRAST TECHNIQUE: Contiguous axial images were obtained from the base of the skull through the vertex without intravenous  contrast. COMPARISON:  CT 09/28/2014 FINDINGS: Brain: No evidence of acute infarction, hemorrhage, hydrocephalus, extra-axial collection or mass lesion/mass effect. Symmetric prominence of the ventricles, cisterns and sulci compatible with parenchymal volume loss. Patchy areas of white matter hypoattenuation are most compatible with chronic microvascular angiopathy. Vascular: Atherosclerotic calcification of the carotid siphons. No hyperdense vessel. Skull: No calvarial fracture or suspicious osseous lesion. Small burr holes towards the vertex for stimulator leads. Coiled leads along the scalp. No scalp swelling or hematoma. Sinuses/Orbits: Paranasal sinuses and mastoid air cells are predominantly clear. Included orbital structures are unremarkable. Other: Bilateral deep brain stimulator leads terminate in stable position within the subthalamic region bilaterally. Resulting streak artifact. No evidence of lead discontinuity along the intracranial or extracranial course. IMPRESSION: 1. No acute intracranial findings. 2. Stable parenchymal volume loss and chronic microvascular angiopathy. 3. Bilateral deep brain stimulator leads terminate in stable position within the subthalamic region bilaterally. No evidence of lead discontinuity along the intracranial or extracranial course. Electronically Signed   By: Lovena Le M.D.   On: 09/11/2019 00:09   DG Chest Port 1 View  Result Date: 09/10/2019 CLINICAL DATA:  Altered mental status EXAM: PORTABLE CHEST 1 VIEW COMPARISON:  Radiograph 08/27/2019 FINDINGS: Bilateral neurostimulator generators project over the left and right chest walls without lead discontinuity. Telemetry leads overlie the chest. New opacity is seen in the periphery of the left lung base. No other focal consolidation. Low volumes with atelectasis. No pneumothorax or effusion. No frank edema. Cardiomediastinal contours are similar to prior counting for differences in technique with a calcified aorta.  No acute osseous or soft tissue abnormality. IMPRESSION: New opacity in the periphery of the left lung base, concerning for pneumonia or aspiration in the appropriate clinical setting. Electronically Signed   By: Lovena Le M.D.   On: 09/10/2019 23:51   DG Chest Port 1 View  Result Date: 08/27/2019 CLINICAL DATA:  Dyspnea EXAM: PORTABLE CHEST 1 VIEW  COMPARISON:  08/02/2019 FINDINGS: The heart size and mediastinal contours are within normal limits. Both lungs are clear. The visualized skeletal structures are unremarkable. IMPRESSION: No acute abnormality of the lungs in AP portable projection. Electronically Signed   By: Eddie Candle M.D.   On: 08/27/2019 13:17

## 2019-09-15 DIAGNOSIS — K219 Gastro-esophageal reflux disease without esophagitis: Secondary | ICD-10-CM

## 2019-09-15 LAB — C-REACTIVE PROTEIN: CRP: 0.5 mg/dL (ref <1.0)

## 2019-09-15 LAB — CBC WITH DIFFERENTIAL/PLATELET
Abs Immature Granulocytes: 0.04 10*3/uL (ref 0.00–0.07)
Basophils Absolute: 0 10*3/uL (ref 0.0–0.1)
Basophils Relative: 0 %
Eosinophils Absolute: 0.1 10*3/uL (ref 0.0–0.5)
Eosinophils Relative: 1 %
HCT: 37.1 % (ref 36.0–46.0)
Hemoglobin: 12.6 g/dL (ref 12.0–15.0)
Immature Granulocytes: 1 %
Lymphocytes Relative: 40 %
Lymphs Abs: 2.3 10*3/uL (ref 0.7–4.0)
MCH: 33.4 pg (ref 26.0–34.0)
MCHC: 34 g/dL (ref 30.0–36.0)
MCV: 98.4 fL (ref 80.0–100.0)
Monocytes Absolute: 0.3 10*3/uL (ref 0.1–1.0)
Monocytes Relative: 6 %
Neutro Abs: 2.9 10*3/uL (ref 1.7–7.7)
Neutrophils Relative %: 52 %
Platelets: 128 10*3/uL — ABNORMAL LOW (ref 150–400)
RBC: 3.77 MIL/uL — ABNORMAL LOW (ref 3.87–5.11)
RDW: 12.5 % (ref 11.5–15.5)
WBC: 5.6 10*3/uL (ref 4.0–10.5)
nRBC: 0 % (ref 0.0–0.2)

## 2019-09-15 LAB — COMPREHENSIVE METABOLIC PANEL
ALT: 14 U/L (ref 0–44)
AST: 13 U/L — ABNORMAL LOW (ref 15–41)
Albumin: 3 g/dL — ABNORMAL LOW (ref 3.5–5.0)
Alkaline Phosphatase: 39 U/L (ref 38–126)
Anion gap: 6 (ref 5–15)
BUN: 18 mg/dL (ref 8–23)
CO2: 31 mmol/L (ref 22–32)
Calcium: 8.8 mg/dL — ABNORMAL LOW (ref 8.9–10.3)
Chloride: 94 mmol/L — ABNORMAL LOW (ref 98–111)
Creatinine, Ser: 0.73 mg/dL (ref 0.44–1.00)
GFR calc Af Amer: >60 mL/min (ref >60)
GFR calc non Af Amer: >60 mL/min (ref >60)
Glucose, Bld: 238 mg/dL — ABNORMAL HIGH (ref 70–99)
Potassium: 3.7 mmol/L (ref 3.5–5.1)
Sodium: 131 mmol/L — ABNORMAL LOW (ref 135–145)
Total Bilirubin: 0.5 mg/dL (ref 0.3–1.2)
Total Protein: 5.6 g/dL — ABNORMAL LOW (ref 6.5–8.1)

## 2019-09-15 LAB — GLUCOSE, CAPILLARY
Glucose-Capillary: 166 mg/dL — ABNORMAL HIGH (ref 70–99)
Glucose-Capillary: 76 mg/dL (ref 70–99)
Glucose-Capillary: 95 mg/dL (ref 70–99)

## 2019-09-15 MED ORDER — HYDROCODONE-ACETAMINOPHEN 5-325 MG PO TABS: 1.0000 | ORAL_TABLET | Freq: Three times a day (TID) | ORAL | 0 refills | Status: DC | PRN

## 2019-09-15 MED ORDER — NOVOLOG FLEXPEN 100 UNIT/ML ~~LOC~~ SOPN: PEN_INJECTOR | SUBCUTANEOUS | 11 refills | Status: DC

## 2019-09-15 NOTE — Progress Notes (Addendum)
[name] to be D/C'd Skilled nursing facility per MD order.  Discussed with the patient and all questions fully answered.  VSS, Skin clean, dry and intact without evidence of skin break down, no evidence of skin tears noted. IV catheter discontinued intact. Site without signs and symptoms of complications. Dressing and pressure applied.  Discharge packet given to PTAR. Prescription placed in AVS packet and given to PTAR. All belongings given to PTAR.   D/c education completed with SNF including follow up instructions, medication list, d/c activities limitations if indicated, with other d/c instructions as indicated by MD - patient and SNF facility able to verbalize understanding, all questions fully answered.    Patient escorted via stretcher, and D/C to SNF via private Ptar.  [name] 09/15/2019 3:13 PM

## 2019-09-15 NOTE — Discharge Summary (Addendum)
PATIENT DETAILS Name: [name] Age: 69 y.o. Sex: female Date of Birth: Aug 21, 1950 MRN: [id number]. Admitting Physician: Rodena Goldmann, DO UDJ:[id number], Langley Gauss, MD  Admit Date: 09/10/2019 Discharge date: 09/15/2019  Recommendations for Outpatient Follow-up:  1. Follow up with PCP in 1-2 weeks 2. Please obtain CMP/CBC in one week 3. Repeat Chest Xray in 4-6 week 4. Consider minimizing polypharmacy-consider slowly titrating down/discontinuing narcotics.  Admitted From:  ALF   Disposition: SNF   Home Health: No  Equipment/Devices: None  Discharge Condition: Stable  CODE STATUS: FULL CODE  Diet recommendation:  Diet Order            Diet - low sodium heart healthy           Diet Carb Modified           Diet heart healthy/carb modified Room service appropriate? Yes; Fluid consistency: Thin  Diet effective now                  Brief Narrative: Patient is a 69 y.o. female with PMHx of HTN, HLD, DM-2, COPD, essential tremors, seizure disorder, chronic pain syndrome-who presented to [facility] for altered mental status secondary to polypharmacy-she was also found to have mild hypoxemia secondary to COVID-19.  She was subsequently transferred to [facility] on 6/7  Significant Events: 6/6>> presented to [facility] with acute toxic encephalopathy secondary to polypharmacy-found to have mild hypoxemia secondary to COVID-19 pneumonia. 6/7>> transferred to [facility].  Significant studies:  6/6>> CT head: No acute intracranial findings. 6/6>> chest x-ray: New opacity in the periphery of the left lung base  COVID-19 medications: Steroids: 6/6>>6/11 Remdesivir: 6/6>>6/11  Antibiotics: Rocephin: 6/6 x 1 Zithromax: 6/6 x 1  Microbiology data: 6/7>> blood culture: Negative  Procedures: None  Consults: None  Brief Hospital Course: Acute toxic encephalopathy: Related to polypharmacy-resolved-she is completely awake and alert.  She has a history of essential  tremors/seizure disorder-as well she has been started back on primidone, Depakote, Seroquel, gabapentin-we will need to hold these medications if she develops any signs of sedation/lethargy.  Her outpatient physicians will need to consider gradual down titration/minimization of polypharmacy.  Acute Hypoxic Resp Failure due to Covid 19 Viral pneumonia: Improved-hypoxia has resolved-both steroids/remdesivir will be discontinued on 6/11.  Does not need any further steroids as patient is not hypoxemic.  COVID-19 Labs:  Recent Labs    09/13/19 0231 09/14/19 0335 09/15/19 0346  DDIMER 0.28 <0.27  --   FERRITIN 146 120  --   CRP 0.8 0.6 0.5    Lab Results  Component Value Date   SARSCOV2NAA POSITIVE (A) 09/11/2019   [place] NEGATIVE 08/27/2019   [place] NEGATIVE 12/19/2018    Thrombocytopenia: Could be secondary to viral illness-has resolved.  Acute urinary retention: Required in/out catheterization on 6/8-continue to monitor closely.  HTN: Controlled-continue metoprolol  Hypothyroidism: Continue levothyroxine  DM-2 with uncontrolled hyperglycemia secondary to steroids: CBGs relatively stable-did have some hyperglycemia related to steroids but has stabilized.  Resume usual home regimen of insulin-continue to optimize in the outpatient setting  History of bronchial asthma: Stable-continue bronchodilators.  History of chronic back pain: Reports back pain which is a chronic issue apparently was on narcotics prior to admission-verified with pharmacy-she has been on hydrocodone/Tylenol 5/325 3 times a day scheduled-have restarted narcotics as needed-given risk of withdrawal symptoms.  Her outpatient physicians will need to consider slow titration-and subsequently discontinue.  History of essential tremor s/p deep brain stimulator: Continue primidone  History of seizures: Continue  Depakote, Neurontin  History of depression: Continue Seroquel/Paxil-mood appears to be  pleasant this morning.  History of tardive dyskinesia: Continue valbenazine  Obesity: Estimated body mass index is 33.2 kg/m as calculated from the following:   Height as of this encounter: 5' 4.5" (1.638 m).   Weight as of this encounter: 89.1 kg   Discharge Diagnoses:  Principal Problem:   Altered mental status Active Problems:   Hypothyroidism   GERD (gastroesophageal reflux disease)   Opioid overdose ([place])   Hypotension   Acute hypoxemic respiratory failure due to COVID-19 [facility])   Discharge Instructions:    Person Under Monitoring Name: [name]  Location: [facility] [address] [place] [address]   Infection Prevention Recommendations for Individuals Confirmed to have, or Being Evaluated for, 2019 Novel Coronavirus (COVID-19) Infection Who Receive Care at Home  Individuals who are confirmed to have, or are being evaluated for, COVID-19 should follow the prevention steps below until a healthcare provider or local or state health department says they can return to normal activities.  Stay home except to get medical care You should restrict activities outside your home, except for getting medical care. Do not go to work, school, or public areas, and do not use public transportation or taxis.  Call ahead before visiting your doctor Before your medical appointment, call the healthcare provider and tell them that you have, or are being evaluated for, COVID-19 infection. This will help the healthcare provider's office take steps to keep other people from getting infected. Ask your healthcare provider to call the local or state health department.  Monitor your symptoms Seek prompt medical attention if your illness is worsening (e.g., difficulty breathing). Before going to your medical appointment, call the healthcare provider and tell them that you have, or are being evaluated for, COVID-19 infection. Ask your healthcare provider to call the  local or state health department.  Wear a facemask You should wear a facemask that covers your nose and mouth when you are in the same room with other people and when you visit a healthcare provider. People who live with or visit you should also wear a facemask while they are in the same room with you.  Separate yourself from other people in your home As much as possible, you should stay in a different room from other people in your home. Also, you should use a separate bathroom, if available.  Avoid sharing household items You should not share dishes, drinking glasses, cups, eating utensils, towels, bedding, or other items with other people in your home. After using these items, you should wash them thoroughly with soap and water.  Cover your coughs and sneezes Cover your mouth and nose with a tissue when you cough or sneeze, or you can cough or sneeze into your sleeve. Throw used tissues in a lined trash can, and immediately wash your hands with soap and water for at least 20 seconds or use an alcohol-based hand rub.  Wash your [facility] your hands often and thoroughly with soap and water for at least 20 seconds. You can use an alcohol-based hand sanitizer if soap and water are not available and if your hands are not visibly dirty. Avoid touching your eyes, nose, and mouth with unwashed hands.   Prevention Steps for Caregivers and Household Members of Individuals Confirmed to have, or Being Evaluated for, COVID-19 Infection Being Cared for in the Home  If you live with, or provide care at home for, a  person confirmed to have, or being evaluated for, COVID-19 infection please follow these guidelines to prevent infection:  Follow healthcare provider's instructions Make sure that you understand and can help the patient follow any healthcare provider instructions for all care.  Provide for the patient's basic needs You should help the patient with basic needs in the home and  provide support for getting groceries, prescriptions, and other personal needs.  Monitor the patient's symptoms If they are getting sicker, call his or her medical provider and tell them that the patient has, or is being evaluated for, COVID-19 infection. This will help the healthcare provider's office take steps to keep other people from getting infected. Ask the healthcare provider to call the local or state health department.  Limit the number of people who have contact with the patient  If possible, have only one caregiver for the patient.  Other household members should stay in another home or place of residence. If this is not possible, they should stay  in another room, or be separated from the patient as much as possible. Use a separate bathroom, if available.  Restrict visitors who do not have an essential need to be in the home.  Keep older adults, very young children, and other sick people away from the patient Keep older adults, very young children, and those who have compromised immune systems or chronic health conditions away from the patient. This includes people with chronic heart, lung, or kidney conditions, diabetes, and cancer.  Ensure good ventilation Make sure that shared spaces in the home have good air flow, such as from an air conditioner or an opened window, weather permitting.  Wash your hands often  Wash your hands often and thoroughly with soap and water for at least 20 seconds. You can use an alcohol based hand sanitizer if soap and water are not available and if your hands are not visibly dirty.  Avoid touching your eyes, nose, and mouth with unwashed hands.  Use disposable paper towels to dry your hands. If not available, use dedicated cloth towels and replace them when they become wet.  Wear a facemask and gloves  Wear a disposable facemask at all times in the room and gloves when you touch or have contact with the patient's blood, body fluids,  and/or secretions or excretions, such as sweat, saliva, sputum, nasal mucus, vomit, urine, or feces.  Ensure the mask fits over your nose and mouth tightly, and do not touch it during use.  Throw out disposable facemasks and gloves after using them. Do not reuse.  Wash your hands immediately after removing your facemask and gloves.  If your personal clothing becomes contaminated, carefully remove clothing and launder. Wash your hands after handling contaminated clothing.  Place all used disposable facemasks, gloves, and other waste in a lined container before disposing them with other household waste.  Remove gloves and wash your hands immediately after handling these items.  Do not share dishes, glasses, or other household items with the patient  Avoid sharing household items. You should not share dishes, drinking glasses, cups, eating utensils, towels, bedding, or other items with a patient who is confirmed to have, or being evaluated for, COVID-19 infection.  After the person uses these items, you should wash them thoroughly with soap and water.  Wash laundry thoroughly  Immediately remove and wash clothes or bedding that have blood, body fluids, and/or secretions or excretions, such as sweat, saliva, sputum, nasal mucus, vomit, urine, or feces, on them.  Wear gloves when handling laundry from the patient.  Read and follow directions on labels of laundry or clothing items and detergent. In general, wash and dry with the warmest temperatures recommended on the label.  Clean all areas the individual has used often  Clean all touchable surfaces, such as counters, tabletops, doorknobs, bathroom fixtures, toilets, phones, keyboards, tablets, and bedside tables, every day. Also, clean any surfaces that may have blood, body fluids, and/or secretions or excretions on them.  Wear gloves when cleaning surfaces the patient has come in contact with.  Use a diluted bleach solution (e.g., dilute  bleach with 1 part bleach and 10 parts water) or a household disinfectant with a label that says EPA-registered for coronaviruses. To make a bleach solution at home, add 1 tablespoon of bleach to 1 quart (4 cups) of water. For a larger supply, add  cup of bleach to 1 gallon (16 cups) of water.  Read labels of cleaning products and follow recommendations provided on product labels. Labels contain instructions for safe and effective use of the cleaning product including precautions you should take when applying the product, such as wearing gloves or eye protection and making sure you have good ventilation during use of the product.  Remove gloves and wash hands immediately after cleaning.  Monitor yourself for signs and symptoms of illness Caregivers and household members are considered close contacts, should monitor their health, and will be asked to limit movement outside of the home to the extent possible. Follow the monitoring steps for close contacts listed on the symptom monitoring form.   ? If you have additional questions, contact your local health department or call the epidemiologist on call at [phone] (available 24/7). ? This guidance is subject to change. For the most up-to-date guidance from CDC, please refer to their website: YouBlogs.pl    Activity:  As tolerated with Full fall precautions use walker/cane & assistance as needed  Discharge Instructions    Diet - low sodium heart healthy   Complete by: As directed    Diet Carb Modified   Complete by: As directed    Increase activity slowly   Complete by: As directed      Allergies as of 09/15/2019      Reactions   Invokana [canagliflozin] Other (See Comments)   Yeast infectios   Losartan Other (See Comments)   Angioedema   Metformin And Related Diarrhea   Latex Other (See Comments)   Other Reaction: redness, blisters   Other Other (See Comments)    Air fresheners - on MAR   Oxycodone-acetaminophen Itching, Other (See Comments)   TYLOX. Caused internal itching per patient    Mucinex [guaifenesin Er] Other (See Comments)   MUCINEX REACTION: asthma attacks      Medication List    STOP taking these medications   amoxicillin-clavulanate 875-125 MG tablet Commonly known as: AUGMENTIN   baclofen 10 MG tablet Commonly known as: LIORESAL   Delsym 30 MG/5ML liquid Generic drug: dextromethorphan   guaifenesin 100 MG/5ML syrup Commonly known as: ROBITUSSIN   predniSONE 10 MG (21) Tbpk tablet Commonly known as: STERAPRED UNI-PAK 21 TAB     TAKE these medications   acetaminophen 500 MG tablet Commonly known as: TYLENOL Take 500 mg by mouth every 4 (four) hours as needed for mild pain, fever or headache.   albuterol 108 (90 Base) MCG/ACT inhaler Commonly known as: ProAir HFA Inhale 2 puffs every 6 hours for asthma - rescue inhaler What changed:   how  much to take  how to take this  when to take this  reasons to take this  additional instructions  Another medication with the same name was removed. Continue taking this medication, and follow the directions you see here.   alum & mag hydroxide-simeth 200-200-20 MG/5ML suspension Commonly known as: MAALOX/MYLANTA Take 30 mLs by mouth as needed for indigestion or heartburn. Max 4 doses per 24 hours   aspirin 81 MG chewable tablet Chew 81 mg by mouth in the morning.   Biofreeze 4 % Gel Generic drug: Menthol (Topical Analgesic) Apply 1 application topically 2 (two) times daily. Apply to lower back and base of right thumb twice daily.   divalproex 500 MG DR tablet Commonly known as: DEPAKOTE Take 500 mg by mouth 3 (three) times daily.   docusate sodium 100 MG capsule Commonly known as: COLACE Take 100 mg by mouth in the morning.   Enulose 10 GM/15ML Soln Generic drug: lactulose (encephalopathy) Take 10 g by mouth daily as needed (for mild constipation).     fluticasone furoate-vilanterol 100-25 MCG/INH Aepb Commonly known as: Breo Ellipta Inhale 1 puff into the lungs daily. What changed: additional instructions   gabapentin 300 MG capsule Commonly known as: NEURONTIN Take 300 mg by mouth See admin instructions. Take 300 mg by mouth in the morning and 600 mg at bedtime   glucose 4 GM chewable tablet Chew 5 tablets by mouth as needed for low blood sugar. Recheck FSBS in 15 minutes and repeat if <40   HYDROcodone-acetaminophen 5-325 MG tablet Commonly known as: NORCO/VICODIN Take 1 tablet by mouth 3 (three) times daily as needed for moderate pain (Please do not give if sedated or lethargic.). What changed:   when to take this  reasons to take this   hydrocortisone cream 1 % Apply 1 application topically 2 (two) times daily as needed ((hemorrhoidal flare)).   insulin glargine 100 UNIT/ML injection Commonly known as: LANTUS Inject 15 Units into the skin at bedtime. *Hold if blood glucose is less than 160*   ipratropium 0.03 % nasal spray Commonly known as: ATROVENT 1-2 puffs each nostril 3 times daily, before meals What changed:   how much to take  how to take this  when to take this  additional instructions   levothyroxine 75 MCG tablet Commonly known as: SYNTHROID TAKE (1) TABLET BY MOUTH ONCE DAILY BEFORE BREAKFAST. What changed: See the new instructions.   loperamide 2 MG tablet Commonly known as: IMODIUM A-D Take 2 mg by mouth 3 (three) times daily as needed for diarrhea or loose stools.   lubiprostone 8 MCG capsule Commonly known as: Amitiza Take 1 capsule (8 mcg total) by mouth 2 (two) times daily with a meal. What changed:   when to take this  reasons to take this   magnesium hydroxide 400 MG/5ML suspension Commonly known as: MILK OF MAGNESIA Take 30 mLs by mouth at bedtime as needed for mild constipation.   metoprolol succinate 50 MG 24 hr tablet Commonly known as: TOPROL-XL Take 50 mg by mouth in  the morning. Take with or immediately following a meal.   multivitamins ther. w/minerals Tabs tablet TAKE ONE TABLET BY MOUTH ONCE DAILY. What changed: when to take this   mupirocin ointment 2 % Commonly known as: BACTROBAN Apply 1 application topically daily. Apply to left 5th digit nailbed every day until healed.   neomycin-bacitracin-polymyxin ointment Commonly known as: NEOSPORIN Apply 1 application topically as needed for wound care.   NovoLOG FlexPen  100 UNIT/ML FlexPen Generic drug: insulin aspart 0-9 Units, Subcutaneous, 3 times daily with meals CBG < 70: Implement Hypoglycemia meaures CBG 70 - 120: 0 units CBG 121 - 150: 1 unit CBG 151 - 200: 2 units CBG 201 - 250: 3 units CBG 251 - 300: 5 units CBG 301 - 350: 7 units CBG 351 - 400: 9 units CBG > 400: call MD What changed:   how much to take  how to take this  when to take this  additional instructions   omega-3 acid ethyl esters 1 g capsule Commonly known as: LOVAZA Take 1 g by mouth daily.   pantoprazole 40 MG tablet Commonly known as: PROTONIX Take 40 mg by mouth daily before breakfast.   PARoxetine 10 MG tablet Commonly known as: PAXIL Take 10 mg by mouth daily.   polyethylene glycol 17 g packet Commonly known as: MIRALAX / GLYCOLAX Take 17 g by mouth daily as needed for moderate constipation. What changed: when to take this   potassium chloride SA 20 MEQ tablet Commonly known as: KLOR-CON Take 20 mEq by mouth daily.   primidone 50 MG tablet Commonly known as: MYSOLINE Take 50 mg by mouth in the morning.   QUEtiapine 100 MG tablet Commonly known as: SEROQUEL Take 100 mg by mouth at bedtime.   Refresh Plus 0.5 % Soln Generic drug: Carboxymethylcellulose Sod PF Place 1 drop into both eyes 2 (two) times daily.   simvastatin 5 MG tablet Commonly known as: ZOCOR Take 5 mg by mouth at bedtime.   sitaGLIPtin 100 MG tablet Commonly known as: JANUVIA Take 100 mg by mouth daily.   sodium  chloride 0.65 % Soln nasal spray Commonly known as: OCEAN Take 1-2 sprays in nostril every hour as needed for rhinitis What changed:   how much to take  how to take this  when to take this  reasons to take this  additional instructions   sodium chloride 1 g tablet Take 1 g by mouth 2 (two) times daily.   Systane Ultra 0.4-0.3 % Soln Generic drug: Polyethyl Glycol-Propyl Glycol Place 1 drop into both eyes 4 (four) times daily as needed (dry eyes).   Valbenazine Tosylate 40 MG Caps Take 40 mg by mouth daily.   vitamin C with rose hips 500 MG tablet Take 500 mg by mouth in the morning.   Vitamin D-3 125 MCG (5000 UT) Tabs Take 5,000 Units by mouth in the morning.   zinc gluconate 50 MG tablet Take 50 mg by mouth daily.       Contact information for follow-up providers    Abran Richard, MD. Schedule an appointment as soon as possible for a visit in 1 week(s).   Specialty: Internal Medicine Contact information: [address] [place] [address] [phone]        Herminio Commons, MD. Schedule an appointment as soon as possible for a visit in 1 month(s).   Specialty: Cardiology Contact information: [place] [address] [phone]            Contact information for after-discharge care    Destination    [facility] .   Service: Skilled Nursing Contact information: [address] [address] [phone]                 Allergies  Allergen Reactions  . Invokana [Canagliflozin] Other (See Comments)    Yeast infectios  . Losartan  Other (See Comments)    Angioedema   . Metformin And Related Diarrhea  . Latex Other (See Comments)    Other Reaction: redness, blisters  . Other Other (See Comments)    Air fresheners - on MAR  . Oxycodone-Acetaminophen Itching and Other (See Comments)    TYLOX. Caused internal itching per patient   . Mucinex [Guaifenesin Er]  Other (See Comments)    MUCINEX REACTION: asthma attacks      Other Procedures/Studies: CT Head Wo Contrast  Result Date: 09/11/2019 CLINICAL DATA:  Altered mental status EXAM: CT HEAD WITHOUT CONTRAST TECHNIQUE: Contiguous axial images were obtained from the base of the skull through the vertex without intravenous contrast. COMPARISON:  CT 09/28/2014 FINDINGS: Brain: No evidence of acute infarction, hemorrhage, hydrocephalus, extra-axial collection or mass lesion/mass effect. Symmetric prominence of the ventricles, cisterns and sulci compatible with parenchymal volume loss. Patchy areas of white matter hypoattenuation are most compatible with chronic microvascular angiopathy. Vascular: Atherosclerotic calcification of the carotid siphons. No hyperdense vessel. Skull: No calvarial fracture or suspicious osseous lesion. Small burr holes towards the vertex for stimulator leads. Coiled leads along the scalp. No scalp swelling or hematoma. Sinuses/Orbits: Paranasal sinuses and mastoid air cells are predominantly clear. Included orbital structures are unremarkable. Other: Bilateral deep brain stimulator leads terminate in stable position within the subthalamic region bilaterally. Resulting streak artifact. No evidence of lead discontinuity along the intracranial or extracranial course. IMPRESSION: 1. No acute intracranial findings. 2. Stable parenchymal volume loss and chronic microvascular angiopathy. 3. Bilateral deep brain stimulator leads terminate in stable position within the subthalamic region bilaterally. No evidence of lead discontinuity along the intracranial or extracranial course. Electronically Signed   By: Lovena Le M.D.   On: 09/11/2019 00:09   DG Chest Port 1 View  Result Date: 09/10/2019 CLINICAL DATA:  Altered mental status EXAM: PORTABLE CHEST 1 VIEW COMPARISON:  Radiograph 08/27/2019 FINDINGS: Bilateral neurostimulator generators project over the left and right chest walls without lead  discontinuity. Telemetry leads overlie the chest. New opacity is seen in the periphery of the left lung base. No other focal consolidation. Low volumes with atelectasis. No pneumothorax or effusion. No frank edema. Cardiomediastinal contours are similar to prior counting for differences in technique with a calcified aorta. No acute osseous or soft tissue abnormality. IMPRESSION: New opacity in the periphery of the left lung base, concerning for pneumonia or aspiration in the appropriate clinical setting. Electronically Signed   By: Lovena Le M.D.   On: 09/10/2019 23:51   DG Chest Port 1 View  Result Date: 08/27/2019 CLINICAL DATA:  Dyspnea EXAM: PORTABLE CHEST 1 VIEW COMPARISON:  08/02/2019 FINDINGS: The heart size and mediastinal contours are within normal limits. Both lungs are clear. The visualized skeletal structures are unremarkable. IMPRESSION: No acute abnormality of the lungs in AP portable projection. Electronically Signed   By: Eddie Candle M.D.   On: 08/27/2019 13:17     TODAY-DAY OF DISCHARGE:  Subjective:   [name] today has no headache,no chest abdominal pain,no new weakness tingling or numbness, feels much better wants to go home today.   Objective:   Blood pressure 101/60, pulse 84, temperature 97.8 F (36.6 C), temperature source Oral, resp. rate 17, height 5' 4.5" (1.638 m), weight 89.1 kg, SpO2 92 %.  Intake/Output Summary (Last 24 hours) at 09/15/2019 1216 Last data filed at 09/15/2019 1100 Gross per 24 hour  Intake 940 ml  Output 2250 ml  Net -1310 ml   [employer]  09/10/19 2244 09/11/19 1537  Weight: 87.5 kg 89.1 kg    Exam: Awake Alert, Oriented *3, No new F.N deficits, Normal affect [place].AT,PERRAL Supple Neck,No JVD, No cervical lymphadenopathy appriciated.  Symmetrical Chest wall movement, Good air movement bilaterally, CTAB RRR,No Gallops,Rubs or new Murmurs, No Parasternal Heave +ve B.Sounds, Abd Soft, Non tender, No organomegaly appriciated,  No rebound -guarding or rigidity. No Cyanosis, Clubbing or edema, No new Rash or bruise   PERTINENT RADIOLOGIC STUDIES: CT Head Wo Contrast  Result Date: 09/11/2019 CLINICAL DATA:  Altered mental status EXAM: CT HEAD WITHOUT CONTRAST TECHNIQUE: Contiguous axial images were obtained from the base of the skull through the vertex without intravenous contrast. COMPARISON:  CT 09/28/2014 FINDINGS: Brain: No evidence of acute infarction, hemorrhage, hydrocephalus, extra-axial collection or mass lesion/mass effect. Symmetric prominence of the ventricles, cisterns and sulci compatible with parenchymal volume loss. Patchy areas of white matter hypoattenuation are most compatible with chronic microvascular angiopathy. Vascular: Atherosclerotic calcification of the carotid siphons. No hyperdense vessel. Skull: No calvarial fracture or suspicious osseous lesion. Small burr holes towards the vertex for stimulator leads. Coiled leads along the scalp. No scalp swelling or hematoma. Sinuses/Orbits: Paranasal sinuses and mastoid air cells are predominantly clear. Included orbital structures are unremarkable. Other: Bilateral deep brain stimulator leads terminate in stable position within the subthalamic region bilaterally. Resulting streak artifact. No evidence of lead discontinuity along the intracranial or extracranial course. IMPRESSION: 1. No acute intracranial findings. 2. Stable parenchymal volume loss and chronic microvascular angiopathy. 3. Bilateral deep brain stimulator leads terminate in stable position within the subthalamic region bilaterally. No evidence of lead discontinuity along the intracranial or extracranial course. Electronically Signed   By: Lovena Le M.D.   On: 09/11/2019 00:09   DG Chest Port 1 View  Result Date: 09/10/2019 CLINICAL DATA:  Altered mental status EXAM: PORTABLE CHEST 1 VIEW COMPARISON:  Radiograph 08/27/2019 FINDINGS: Bilateral neurostimulator generators project over the left and  right chest walls without lead discontinuity. Telemetry leads overlie the chest. New opacity is seen in the periphery of the left lung base. No other focal consolidation. Low volumes with atelectasis. No pneumothorax or effusion. No frank edema. Cardiomediastinal contours are similar to prior counting for differences in technique with a calcified aorta. No acute osseous or soft tissue abnormality. IMPRESSION: New opacity in the periphery of the left lung base, concerning for pneumonia or aspiration in the appropriate clinical setting. Electronically Signed   By: Lovena Le M.D.   On: 09/10/2019 23:51   DG Chest Port 1 View  Result Date: 08/27/2019 CLINICAL DATA:  Dyspnea EXAM: PORTABLE CHEST 1 VIEW COMPARISON:  08/02/2019 FINDINGS: The heart size and mediastinal contours are within normal limits. Both lungs are clear. The visualized skeletal structures are unremarkable. IMPRESSION: No acute abnormality of the lungs in AP portable projection. Electronically Signed   By: Eddie Candle M.D.   On: 08/27/2019 13:17     PERTINENT LAB RESULTS: CBC: Recent Labs    09/14/19 0335 09/15/19 0346  WBC 5.1 5.6  HGB 11.6* 12.6  HCT 33.3* 37.1  PLT 121* 128*   CMET CMP     Component Value Date/Time   NA 131 (L) 09/15/2019 0346   K 3.7 09/15/2019 0346   CL 94 (L) 09/15/2019 0346   CO2 31 09/15/2019 0346   GLUCOSE 238 (H) 09/15/2019 0346   BUN 18 09/15/2019 0346   CREATININE 0.73 09/15/2019 0346   CREATININE 0.70 12/27/2012 1006   CALCIUM 8.8 (L) 09/15/2019 0346  PROT 5.6 (L) 09/15/2019 0346   ALBUMIN 3.0 (L) 09/15/2019 0346   AST 13 (L) 09/15/2019 0346   ALT 14 09/15/2019 0346   ALKPHOS 39 09/15/2019 0346   BILITOT 0.5 09/15/2019 0346   GFRNONAA >60 09/15/2019 0346   GFRAA >60 09/15/2019 0346    GFR Estimated Creatinine Clearance: 71.5 mL/min (by C-G formula based on SCr of 0.73 mg/dL). No results for input(s): LIPASE, AMYLASE in the last 72 hours. No results for input(s): CKTOTAL,  CKMB, CKMBINDEX, TROPONINI in the last 72 hours. Invalid input(s): POCBNP Recent Labs    09/13/19 0231 09/14/19 0335  DDIMER 0.28 <0.27   No results for input(s): HGBA1C in the last 72 hours. No results for input(s): CHOL, HDL, LDLCALC, TRIG, CHOLHDL, LDLDIRECT in the last 72 hours. No results for input(s): TSH, T4TOTAL, T3FREE, THYROIDAB in the last 72 hours.  Invalid input(s): FREET3 Recent Labs    09/13/19 0231 09/14/19 0335  FERRITIN 146 120   Coags: No results for input(s): INR in the last 72 hours.  Invalid input(s): PT Microbiology: Recent Results (from the past 240 hour(s))  SARS Coronavirus 2 by RT PCR (hospital order, performed in [facility] hospital lab) Nasopharyngeal Nasopharyngeal Swab     Status: Abnormal   Collection Time: 09/11/19  1:34 AM   Specimen: Nasopharyngeal Swab  Result Value Ref Range Status   SARS Coronavirus 2 POSITIVE (A) NEGATIVE Final    Comment: RESULT CALLED TO, READ BACK BY AND VERIFIED WITH: W[place],C  0328 ON 09/11/19 BY JUW (NOTE) SARS-CoV-2 target nucleic acids are DETECTED SARS-CoV-2 RNA is generally detectable in upper respiratory specimens  during the acute phase of infection.  Positive results are indicative  of the presence of the identified virus, but do not rule out bacterial infection or co-infection with other pathogens not detected by the test.  Clinical correlation with patient history and  other diagnostic information is necessary to determine patient infection status.  The expected result is negative. Fact Sheet for Patients:   StrictlyIdeas.no  Fact Sheet for Healthcare Providers:   BankingDealers.co.za   This test is not yet approved or cleared by the [place] FDA and  has been authorized for detection and/or diagnosis of SARS-CoV-2 by FDA under an Emergency Use Authorization (EUA).  This EUA will remain in effect (meaning this test can  be used) for the duration  of  the COVID-19 declaration under Section 564(b)(1) of the Act, 21 U.S.C. section 360-bbb-3(b)(1), unless the authorization is terminated or revoked sooner. Performed at [facility], [address], [place], [place] [address]   Culture, blood (routine x 2)     Status: None (Preliminary result)   Collection Time: 09/11/19  4:23 AM   Specimen: BLOOD  Result Value Ref Range Status   Specimen Description BLOOD RIGHT ANTECUBITAL  Final   Special Requests   Final    BOTTLES DRAWN AEROBIC AND ANAEROBIC Blood Culture adequate volume   Culture   Final    NO GROWTH 4 DAYS Performed at [facility], [address], [place], [place] [address]    Report Status PENDING  Incomplete    FURTHER DISCHARGE INSTRUCTIONS:  Get Medicines reviewed and adjusted: Please take all your medications with you for your next visit with your Primary MD  Laboratory/radiological data: Please request your Primary MD to go over all hospital tests and procedure/radiological results at the follow up, please ask your Primary MD to get all Hospital records sent to his/her office.  In some cases,  they will be blood work, cultures and biopsy results pending at the time of your discharge. Please request that your primary care M.D. goes through all the records of your hospital data and follows up on these results.  Also Note the following: If you experience worsening of your admission symptoms, develop shortness of breath, life threatening emergency, suicidal or homicidal thoughts you must seek medical attention immediately by calling 911 or calling your MD immediately  if symptoms less severe.  You must read complete instructions/literature along with all the possible adverse reactions/side effects for all the Medicines you take and that have been prescribed to you. Take any new Medicines after you have completely understood and accpet all the possible adverse reactions/side effects.   Do not drive when taking Pain  medications or sleeping medications (Benzodaizepines)  Do not take more than prescribed Pain, Sleep and Anxiety Medications. It is not advisable to combine anxiety,sleep and pain medications without talking with your primary care practitioner  Special Instructions: If you have smoked or chewed Tobacco  in the last 2 yrs please stop smoking, stop any regular Alcohol  and or any Recreational drug use.  Wear Seat belts while driving.  Please note: You were cared for by a hospitalist during your hospital stay. Once you are discharged, your primary care physician will handle any further medical issues. Please note that NO REFILLS for any discharge medications will be authorized once you are discharged, as it is imperative that you return to your primary care physician (or establish a relationship with a primary care physician if you do not have one) for your post hospital discharge needs so that they can reassess your need for medications and monitor your lab values.  Total Time spent coordinating discharge including counseling, education and face to face time equals 35 minutes.  SignedOren Binet 09/15/2019 12:16 PM

## 2019-09-15 NOTE — Progress Notes (Signed)
Hypoglycemic Event  CBG: 76  Treatment: 8 oz juice/soda  Symptoms: None  Follow-up CBG: Time:1248 CBG Result:95  Possible Reasons for Event: Unknown  Comments/MD notified: Ghimire,MD    Littie Deeds Josilyn Shippee

## 2019-09-15 NOTE — TOC Transition Note (Addendum)
Transition of Care [facility]) - CM/SW Discharge Note   Patient Details  Name: [name] MRN: [id number] Date of Birth: Dec 26, 1950  [facility]) CM/SW Contact:  Benard Halsted, LCSW Phone Number: 09/15/2019, 1:30 PM   Clinical Narrative:    Patient will DC to: [facility] Anticipated DC date: 09/15/19 Family notified: Pt declined and stated she will alert her daughter Transport by: Corey Harold   Per MD patient ready for DC to [place]. RN, patient, patient's family, and facility notified of DC. Discharge Summary and FL2 sent to facility. RN to call report prior to discharge [phone] or [phone] DC packet on chart, including signed script. Ambulance transport requested for patient.   CSW will sign off for now as social work intervention is no longer needed. Please consult [place] again if new needs arise.      Final next level of care: Skilled Nursing Facility Barriers to Discharge: No Barriers Identified   Patient Goals and CMS Choice Patient states their goals for this hospitalization and ongoing recovery are:: Rehab CMS Medicare.gov Compare Post Acute Care list provided to:: Patient Choice offered to / list presented to : Patient  Discharge Placement PASRR number recieved:  (Still pending but screening completed)            Patient [facility] bed at: [facility] Patient to be transferred to facility by: [facility] Name of family member notified: Patient declined Patient and family notified of of transfer: 09/15/19  Discharge Plan and Services In-house Referral: Clinical Social Work   Post Acute Care Choice: [place]                               Social Determinants of Health (SDOH) Interventions     Readmission Risk Interventions Readmission Risk Prevention Plan 09/13/2019 09/11/2019  Transportation Screening - Complete  PCP or Specialist Appt within 3-5 Days Complete -  Medication Review (RN CM) - Complete  HRI or Home Care  Consult Complete -  Social Work Consult for [place] Planning/Counseling Complete -  Palliative Care Screening Not Applicable -  Medication Review (RN Care Manager) Referral to Pharmacy -  Some recent data might be hidden

## 2019-09-15 NOTE — TOC Progression Note (Addendum)
Transition of Care [facility]) - Progression Note    Patient Details  Name: [name] MRN: [id number] Date of Birth: 15-Feb-1951  Transition of Care [facility]) CM/SW [place], LCSW Phone Number: 09/15/2019, 10:22 AM  Clinical Narrative:    10:22am-[facility] able to accept patient. Pasrr and insurance approval still still pending.   11:25am-Per Tonya at [facility], they are able to accept patient today even though pasrr has not published yet (screening assessment done by B. Akin yesterday).    11:28am-Insurance approval received: Ref # [id number], [id number] through 09/22/19.  Expected Discharge Plan: Skilled Nursing Facility Barriers to Discharge: Continued Medical Work up, [place] ([place]), Insurance Authorization  Expected Discharge Plan and Services Expected Discharge Plan: [place] In-house Referral: Clinical Social Work   Post Acute Care Choice: [place] Living arrangements for the past 2 months: [place]                                       Social Determinants of Health (SDOH) Interventions    Readmission Risk Interventions Readmission Risk Prevention Plan 09/13/2019 09/11/2019  Transportation Screening - Complete  PCP or Specialist Appt within 3-5 Days Complete -  Medication Review (RN CM) - Complete  HRI or Home Care Consult Complete -  Social Work Consult for [place] Planning/Counseling Complete -  Palliative Care Screening Not Applicable -  Medication Review (RN Care Manager) Referral to Pharmacy -  Some recent data might be hidden

## 2019-09-16 LAB — CULTURE, BLOOD (ROUTINE X 2)
Culture: NO GROWTH
Special Requests: ADEQUATE

## 2019-09-18 ENCOUNTER — Non-Acute Institutional Stay (SKILLED_NURSING_FACILITY): Payer: Medicare Other | Admitting: Adult Health

## 2019-09-18 ENCOUNTER — Encounter: Payer: Self-pay | Admitting: Adult Health

## 2019-09-18 DIAGNOSIS — G9340 Encephalopathy, unspecified: Secondary | ICD-10-CM | POA: Diagnosis not present

## 2019-09-18 DIAGNOSIS — I10 Essential (primary) hypertension: Secondary | ICD-10-CM

## 2019-09-18 DIAGNOSIS — F339 Major depressive disorder, recurrent, unspecified: Secondary | ICD-10-CM

## 2019-09-18 DIAGNOSIS — M549 Dorsalgia, unspecified: Secondary | ICD-10-CM | POA: Insufficient documentation

## 2019-09-18 DIAGNOSIS — E038 Other specified hypothyroidism: Secondary | ICD-10-CM

## 2019-09-18 DIAGNOSIS — M545 Low back pain, unspecified: Secondary | ICD-10-CM

## 2019-09-18 DIAGNOSIS — U071 COVID-19: Secondary | ICD-10-CM

## 2019-09-18 DIAGNOSIS — E118 Type 2 diabetes mellitus with unspecified complications: Secondary | ICD-10-CM

## 2019-09-18 DIAGNOSIS — R569 Unspecified convulsions: Secondary | ICD-10-CM

## 2019-09-18 DIAGNOSIS — G250 Essential tremor: Secondary | ICD-10-CM

## 2019-09-18 DIAGNOSIS — J1282 Pneumonia due to coronavirus disease 2019: Secondary | ICD-10-CM

## 2019-09-18 DIAGNOSIS — J9601 Acute respiratory failure with hypoxia: Secondary | ICD-10-CM | POA: Diagnosis not present

## 2019-09-18 DIAGNOSIS — J4540 Moderate persistent asthma, uncomplicated: Secondary | ICD-10-CM

## 2019-09-18 DIAGNOSIS — G8929 Other chronic pain: Secondary | ICD-10-CM

## 2019-09-18 DIAGNOSIS — G2401 Drug induced subacute dyskinesia: Secondary | ICD-10-CM

## 2019-09-18 NOTE — Progress Notes (Signed)
Location:  [facility] Room Number: 109-N Place of Service:  SNF (31) Provider:  Durenda Age, DNP, FNP-BC  Patient Care Team: Abran Richard, MD as PCP - General (Internal Medicine) Herminio Commons, MD as PCP - Cardiology (Cardiology) Deneise Lever, MD as Consulting Physician (Pulmonary Disease) Minus Breeding, MD as Consulting Physician (Cardiology) Tat, Eustace Quail, DO as Consulting Physician (Neurology) Herminio Commons, MD as Consulting Physician (Cardiology) Gala Romney Cristopher Estimable, MD as Consulting Physician (Gastroenterology)  Extended Emergency Contact Information Primary Emergency Contact: [name], [place] [address] [place] Phone: [phone] Relation: Daughter  Code Status:  FULL CODE  Goals of care: Advanced Directive information Advanced Directives 09/11/2019  Does Patient Have a Medical Advance Directive? No  Type of Advance Directive -  Does patient want to make changes to medical advance directive? -  Copy of [place] in Chart? -  Would patient like information on creating a medical advance directive? Yes (Inpatient - patient defers creating a medical advance directive at this time - Information given)  Pre-existing out of facility DNR order (yellow form or pink MOST form) -     Chief Complaint  Patient presents with  . Acute Visit    Patient is seen for hospital followup, status post admission at [facility] 6/6-6/11/21 for altered mental status.    HPI:  Pt is a 69 y.o. female who was admitted to [facility] on 09/15/2019 post [facility] hospitalization 09/10/2019 to 09/15/2019 for acute toxic encephalopathy.  She has a PMH of bipolar 1 disorder, COPD, DDD, dysphagia, dysarthria, diabetes mellitus, hypertension, SVT, tremor and hypothyroidism.  On 09/10/2019, she presented to [facility] for altered mental status secondary to polypharmacy. She was thought to be in deep  sleep and difficult to arouse. Her O2 sat was at 97% on room air and suddenly dropped to 84% while in the ED. UA was negative for UTI, was less than 10.  UDS positive for opiates and barbiturates. Staff in in skilled nursing facility reported that she most probably was hoarding medications in her room.  She was given a dose of Narcan and was arousable which is rubbing. Chest x-ray showed bilateral lower lobe infiltrates. CT head negative for acute intracranial bleed or pathology.  She was given IV ceftriaxone and IV azithromycin in the ED. COVID-19 test results came back positive.  Azithromycin and Rocephin has been discontinued as procalcitonin was low and she does not have a leukocytosis. She was noted to have minimal elevation in inflammatory markers.  She was started on IV steroids and remdesivir. It was confirmed by SNF facility that she had COVID-19 infection last December and received her vaccine in January/February. he was subsequently transferred to [facility] on 09/11/2019 for further care. She was given steroids and remdesivir on 6/6-6/11.  She was seen in her room today. She said that she uses a wheelchair to ambulate in her skilled facility and would like to go back there after her short-term rehabilitation.  Past Medical History:  Diagnosis Date  . Allergic rhinitis    uses Nasonex daily as needed  . Allergy to angiotensin receptor blockers (ARB) 01/22/2014   Angioedema  . Anxiety    takes Ativan daily as needed  . Aspiration pneumonia ([place])   . Asthma    Albuterol inhaler and Neb daily as needed  . 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 [name], residual palpitations  . Bipolar 1 disorder ([place])   . Chronic back pain    reason unknown  . Constipation   . COPD (chronic obstructive pulmonary disease) ([place])   . DDD (degenerative disc disease), lumbar   . Depression    takes Zoloft daily as well as Cymbalta  . Diabetes mellitus      Type II  . Dysphagia   . Dysrhythmia    SVT ==ablation done by Dr. Rayann Heman 2007  . Esophageal reflux    takes Zantac daily  . Frequent headaches   . History of bronchitis Dec 2014  . Hypertension    takes Losartan daily  . Hypothyroidism    takes Synthroid daily  . Joint pain   . Joint swelling    left ankle  . Low sodium syndrome   . Lumbar radiculopathy   . Seizures ([place]) 04-05-13   one time ran out of Primidone and had stopped it cold [place]  . SVT (supraventricular tachycardia) ([place])   . Tremor, essential    takes Mysoline and Neurontin daily.  . Vocal cord dysfunction   . Weakness    in left arm   Past Surgical History:  Procedure Laterality Date  . ABDOMINAL HYSTERECTOMY    . ABLATION FOR AVNRT    . APPENDECTOMY    . BARTHOLIN GLAND CYST EXCISION     x 2  . BIOPSY  03/04/2015   Procedure: BIOPSY (Duodenal, Gastric);  Surgeon: Daneil Dolin, MD;  Location: [facility] ORS;  Service: Endoscopy;;  . BIOPSY  12/19/2018   Procedure: BIOPSY;  Surgeon: Daneil Dolin, MD;  Location: [facility] ENDO SU[facility];  Service: Endoscopy;;  Gastric Biopsies    . BREAST SURGERY    . CESAREAN SECTION    . CHOLECYSTECTOMY    . COLONOSCOPY  01/16/2004   EYC:XKGYJE rectum/colon  . COLONOSCOPY WITH PROPOFOL N/A 03/04/2015   Dr.[name]- internal hemorrhoids, pancolonic diverticulosis, colonic polyp= tubular adenoma  . DEEP BRAIN STIMULATOR PLACEMENT    . DENTAL SURGERY    . ESOPHAGEAL DILATION N/A 03/04/2015   Procedure: ESOPHAGEAL DILATION WITH 54FR MALONEY DILATOR;  Surgeon: Daneil Dolin, MD;  Location: [facility] ORS;  Service: Endoscopy;  Laterality: N/A;  . ESOPHAGEAL DILATION  12/19/2018   Procedure: ESOPHAGEAL DILATION;  Surgeon: Daneil Dolin, MD;  Location[facility];  Service: Endoscopy;;  . ESOPHAGOGASTRODUODENOSCOPY (EGD) WITH ESOPHAGEAL DILATION  04/03/2002   HUD:[id number]'Z ring, otherwise normal esophagus, status post dilation with 56 F/Normal stomach  . ESOPHAGOGASTRODUODENOSCOPY (EGD)  WITH ESOPHAGEAL DILATION N/A 11/08/2012   Dr. [name]:schatzki's ring s/p dilation/hiatal hernia  . ESOPHAGOGASTRODUODENOSCOPY (EGD) WITH PROPOFOL N/A 03/04/2015   Dr.[name]- schatzki's ring, hiatal hernia, scattered erosions bx= chronic inactive gastritis  . ESOPHAGOGASTRODUODENOSCOPY (EGD) WITH PROPOFOL N/A 05/06/2017   Normal esophagus. Dilated. Small hiatal hernia. Normal duodenal bulb and second portion of duodenum.  . ESOPHAGOGASTRODUODENOSCOPY (EGD) WITH PROPOFOL N/A 12/19/2018   Procedure: ESOPHAGOGASTRODUODENOSCOPY (EGD) WITH PROPOFOL;  Surgeon: Daneil Dolin, MD;  [facility];  Service: Endoscopy;  Laterality: N/A;  11:15am  . FINGER SURGERY    . HEMORRHOID BANDING  2017   Dr.[name]  . LEFT ANKLE LIGAMENT REPAIR     x 2  . LEFT ELBOW REPAIR    . MALONEY DILATION N/A 05/06/2017   Procedure: Venia Minks DILATION;  Surgeon: Daneil Dolin, MD;  [facility];  Service: Endoscopy;  Laterality: N/A;  . MALONEY DILATION N/A 12/19/2018  Procedure: MALONEY DILATION;  Surgeon: Daneil Dolin, MD;  [facility];  Service: Endoscopy;  Laterality: N/A;  . MAXIMUM ACCESS (MAS)POSTERIOR LUMBAR INTERBODY FUSION (PLIF) 1 LEVEL N/A 04/24/2014   Procedure: Lumbar four-five Maximum access Surgery posterior lumbar interbody fusion;  Surgeon: [name], MD;  Location: [facility] NEURO ORS;  Service: Neurosurgery;  Laterality: N/A;  Lumbar four-five Maximum access Surgery posterior lumbar interbody fusion  . MULTIPLE EXTRACTIONS WITH ALVEOLOPLASTY N/A 06/18/2017   Procedure: MULTIPLE EXTRACTION;  Surgeon: Diona Browner, DDS;  Location: [facility];  Service: Oral Surgery;  Laterality: N/A;  . PARTIAL HYSTERECTOMY    . POLYPECTOMY  03/04/2015   Procedure: POLYPECTOMY (descending colon);  Surgeon: Daneil Dolin, MD;  Location: [facility];  Service: Endoscopy;;  . PULSE GENERATOR IMPLANT Bilateral 12/15/2013   Procedure: BILATERAL PULSE GENERATOR IMPLANT;  Surgeon: [name], MD;  Location: [facility]  NEURO ORS;  Service: Neurosurgery;  Laterality: Bilateral;  BILATERAL  . RIGHT BREAST CYST     benign  . SUBTHALAMIC STIMULATOR BATTERY REPLACEMENT Bilateral 06/24/2018   Procedure: Bilateral implantable pulse generator battery change;  Surgeon: [name], MD;  Location: [facility];  Service: Neurosurgery;  Laterality: Bilateral;  bilateral  . SUBTHALAMIC STIMULATOR INSERTION Bilateral 12/04/2013   Procedure: Bilateral Deep brain stimulator placement;  Surgeon: [name], MD;  Location: [facility] NEURO ORS;  Service: Neurosurgery;  Laterality: Bilateral;  Bilateral Deep brain stimulator placement  . TONSILLECTOMY      Allergies  Allergen Reactions  . Invokana [Canagliflozin] Other (See Comments)    Yeast infectios  . Losartan Other (See Comments)    Angioedema   . Metformin And Related Diarrhea  . Latex Other (See Comments)    Other Reaction: redness, blisters  . Other Other (See Comments)    Air fresheners - on MAR  . Oxycodone-Acetaminophen Itching and Other (See Comments)    TYLOX. Caused internal itching per patient   . Mucinex [Guaifenesin Er] Other (See Comments)    MUCINEX REACTION: asthma attacks    Outpatient Encounter Medications as of 09/18/2019  Medication Sig  . acetaminophen (TYLENOL) 500 MG tablet Take 500 mg by mouth every 4 (four) hours as needed for mild pain, fever or headache.   . albuterol (PROAIR HFA) 108 (90 Base) MCG/ACT inhaler Inhale 2 puffs every 6 hours for asthma - rescue inhaler  . alum & mag hydroxide-simeth (MAALOX/MYLANTA) 200-200-20 MG/5ML suspension Take 30 mLs by mouth as needed for indigestion or heartburn. Max 4 doses per 24 hours  . Ascorbic Acid (VITAMIN C WITH ROSE HIPS) 500 MG tablet Take 500 mg by mouth in the morning.  Marland Kitchen aspirin 81 MG chewable tablet Chew 81 mg by mouth in the morning.   . Carboxymethylcellulose Sod PF (REFRESH PLUS) 0.5 % SOLN Place 1 drop into both eyes 2 (two) times daily.   . Cholecalciferol (VITAMIN D-3) 125 MCG (5000 UT)  TABS Take 5,000 Units by mouth in the morning.   . divalproex (DEPAKOTE) 500 MG DR tablet Take 500 mg by mouth 3 (three) times daily.   Marland Kitchen docusate sodium (COLACE) 100 MG capsule Take 100 mg by mouth in the morning.   Marland Kitchen ENULOSE 10 GM/15ML SOLN Take 10 g by mouth daily as needed (for mild constipation).   . fluticasone furoate-vilanterol (BREO ELLIPTA) 100-25 MCG/INH AEPB Inhale 1 puff into the lungs daily.  Marland Kitchen gabapentin (NEURONTIN) 300 MG capsule Take 300 mg by mouth See admin instructions. Take 300 mg by mouth in the morning and  600 mg at bedtime  . glucose 4 GM chewable tablet Chew 5 tablets by mouth as needed for low blood sugar. Recheck FSBS in 15 minutes and repeat if <40  . HYDROcodone-acetaminophen (NORCO/VICODIN) 5-325 MG tablet Take 1 tablet by mouth 3 (three) times daily as needed for moderate pain (Please do not give if sedated or lethargic.).  Marland Kitchen hydrocortisone cream 1 % Apply 1 application topically 2 (two) times daily as needed ((hemorrhoidal flare)).   Marland Kitchen insulin glargine (LANTUS) 100 UNIT/ML injection Inject 15 Units into the skin at bedtime. *Hold if blood glucose is less than 160*  . ipratropium (ATROVENT) 0.03 % nasal spray 1-2 puffs each nostril 3 times daily, before meals  . levothyroxine (SYNTHROID, LEVOTHROID) 75 MCG tablet TAKE (1) TABLET BY MOUTH ONCE DAILY BEFORE BREAKFAST.  Marland Kitchen loperamide (IMODIUM A-D) 2 MG tablet Take 2 mg by mouth 3 (three) times daily as needed for diarrhea or loose stools.   . lubiprostone (AMITIZA) 8 MCG capsule Take 1 capsule (8 mcg total) by mouth 2 (two) times daily with a meal.  . magnesium hydroxide (MILK OF MAGNESIA) 400 MG/5ML suspension Take 30 mLs by mouth at bedtime as needed for mild constipation.   . Menthol, Topical Analgesic, (BIOFREEZE) 4 % GEL Apply 1 application topically 2 (two) times daily. Apply to lower back and base of right thumb twice daily.  . metoprolol succinate (TOPROL-XL) 50 MG 24 hr tablet Take 50 mg by mouth in the morning.  Take with or immediately following a meal.   . Multiple Vitamins-Minerals (MULTIVITAMINS THER. W/MINERALS) TABS tablet TAKE ONE TABLET BY MOUTH ONCE DAILY.  . mupirocin ointment (BACTROBAN) 2 % Apply 1 application topically daily. Apply to left 5th digit nailbed every day until healed.  Marland Kitchen NOVOLOG FLEXPEN 100 UNIT/ML FlexPen 0-9 Units, Subcutaneous, 3 times daily with meals CBG < 70: Implement Hypoglycemia meaures CBG 70 - 120: 0 units CBG 121 - 150: 1 unit CBG 151 - 200: 2 units CBG 201 - 250: 3 units CBG 251 - 300: 5 units CBG 301 - 350: 7 units CBG 351 - 400: 9 units CBG > 400: call MD  . omega-3 acid ethyl esters (LOVAZA) 1 g capsule Take 1 g by mouth daily.  . pantoprazole (PROTONIX) 40 MG tablet Take 40 mg by mouth daily before breakfast.   . PARoxetine (PAXIL) 10 MG tablet Take 10 mg by mouth daily.   . polyethylene glycol (MIRALAX / GLYCOLAX) packet Take 17 g by mouth daily as needed for moderate constipation.  . potassium chloride SA (K-DUR,KLOR-CON) 20 MEQ tablet Take 20 mEq by mouth daily.  . primidone (MYSOLINE) 50 MG tablet Take 50 mg by mouth in the morning.   Marland Kitchen QUEtiapine (SEROQUEL) 100 MG tablet Take 100 mg by mouth at bedtime.  . simvastatin (ZOCOR) 5 MG tablet Take 5 mg by mouth at bedtime.   . sitaGLIPtin (JANUVIA) 100 MG tablet Take 100 mg by mouth daily.  . sodium chloride (OCEAN) 0.65 % nasal spray Place 2 sprays into the nose as needed (Rhinitis). Both nostrils  . sodium chloride 1 g tablet Take 1 g by mouth 2 (two) times daily.  Minus [place] Tosylate 40 MG CAPS Take 40 mg by mouth daily.  Marland Kitchen zinc gluconate 50 MG tablet Take 50 mg by mouth daily.  . [DISCONTINUED] neomycin-bacitracin-polymyxin (NEOSPORIN) ointment Apply 1 application topically as needed for wound care.  . [DISCONTINUED] Polyethyl Glycol-Propyl Glycol (SYSTANE ULTRA) 0.4-0.3 % SOLN Place 1 drop into both eyes  4 (four) times daily as needed (dry eyes).  . [DISCONTINUED] sodium chloride (OCEAN) 0.65 %  SOLN nasal spray Take 1-2 sprays in nostril every hour as needed for rhinitis   No facility-administered encounter medications on file as of 09/18/2019.    Review of Systems  GENERAL: No change in appetite, no fatigue, no weight changes, no fever, chills or weakness MOUTH and THROAT: Denies oral discomfort, gingival pain or bleeding RESPIRATORY: no cough, SOB, DOE, wheezing, hemoptysis CARDIAC: No chest pain, edema or palpitations GI: No abdominal pain, diarrhea, constipation, heart burn, nausea or vomiting GU: Denies dysuria, frequency, hematuria, incontinence, or discharge NEUROLOGICAL: Denies dizziness, syncope, numbness, or headache PSYCHIATRIC: Denies feelings of depression or anxiety. No report of hallucinations, insomnia, paranoia, or agitation   Immunization History  Administered Date(s) Administered  . Influenza Split 12/17/2010, 02/16/2012, 12/27/2012, 11/29/2014, 01/04/2018  . Influenza Whole 12/16/2009  . Influenza, High Dose Seasonal PF 12/11/2016  . Influenza, Quadrivalent, Recombinant, Inj, Pf 01/04/2019  . Influenza,inj,Quad PF,6+ Mos 01/18/2013  . Influenza,inj,quad, With Preservative 01/04/2017  . Influenza-Unspecified 12/19/2013, 11/30/2015  . Moderna SARS-COVID-2 Vaccination 04/29/2019, 05/20/2019  . PPD Test 11/21/2014  . Pneumococcal Conjugate-13 03/02/2014  . Pneumococcal Polysaccharide-23 02/08/2004, 04/06/2010  . Tdap 11/21/2008  . Zoster 02/12/2014, 11/28/2014   Pertinent  Health Maintenance Due  Topic Date Due  . PNA vac Low Risk Adult (2 of 2 - PPSV23) 04/07/2015  . URINE MICROALBUMIN  02/13/2016  . OPHTHALMOLOGY EXAM  04/17/2016  . MAMMOGRAM  10/17/2016  . FOOT EXAM  06/17/2019  . INFLUENZA VACCINE  11/05/2019  . HEMOGLOBIN A1C  03/11/2020  . COLONOSCOPY  03/03/2025  . DEXA SCAN  Completed   Fall Risk  09/19/2018 05/26/2018 12/28/2017 08/20/2017 07/15/2017  Falls in the past year? 0 1 Yes No No  Number falls in past yr: 0 0 2 or more - -  Injury  with Fall? 0 0 No - -  Risk Factor Category  - - High Fall Risk - -  Risk for fall due to : - - - - -  Follow up - Falls evaluation completed Falls evaluation completed - -     Vitals:   09/18/19 1250  BP: 120/73  Pulse: (!) 102  Resp: 20  Temp: 98.1 F (36.7 C)  TempSrc: Oral  SpO2: 92%  Weight: 185 lb 9.6 oz (84.2 kg)  Height: 5\' 4"  (1.626 m)   Body mass index is 31.86 kg/m.  Physical Exam  GENERAL APPEARANCE: Well nourished. In no acute distress. Obese SKIN:  Skin is warm and dry.  MOUTH and THROAT: Lips are without lesions. Oral mucosa is moist and without lesions. Tongue is normal in shape, size, and color and without lesions RESPIRATORY: Breathing is even & unlabored, BS CTAB CARDIAC: RRR, no murmur,no extra heart sounds, no edema GI: Abdomen soft, normal BS, no masses, no tenderness EXTREMITIES: Able to move x4 extremities NEUROLOGICAL: Has tremors. Speech is clear. Alert and oriented X 3. PSYCHIATRIC:  Affect and behavior are appropriate  Labs reviewed: Recent Labs    09/12/19 0444 09/12/19 0444 09/13/19 0231 09/14/19 0335 09/15/19 0346  NA 138   < > 136 134* 131*  K 3.9   < > 4.3 4.2 3.7  CL 103   < > 101 96* 94*  CO2 26   < > 26 30 31   GLUCOSE 203*   < > 190* 231* 238*  BUN 8   < > 14 15 18   CREATININE 0.54   < >  0.56 0.80 0.73  CALCIUM 8.6*   < > 8.8* 8.8* 8.8*  MG 1.9  --  1.7 1.7  --    < > = values in this interval not displayed.   Recent Labs    09/13/19 0231 09/14/19 0335 09/15/19 0346  AST 14* 10* 13*  ALT 15 12 14   ALKPHOS 40 37* 39  BILITOT 0.6 0.5 0.5  PROT 5.7* 5.3* 5.6*  ALBUMIN 3.0* 2.8* 3.0*   Recent Labs    09/13/19 0231 09/14/19 0335 09/15/19 0346  WBC 5.2 5.1 5.6  NEUTROABS 3.8 3.1 2.9  HGB 11.6* 11.6* 12.6  HCT 34.1* 33.3* 37.1  MCV 99.1 97.7 98.4  PLT 114* 121* 128*   Lab Results  Component Value Date   TSH 1.01 05/21/2015   Lab Results  Component Value Date   HGBA1C 7.4 (H) 09/10/2019   Lab Results    Component Value Date   CHOL 180 02/13/2015   HDL 64.80 02/13/2015   LDLCALC 80 02/13/2015   TRIG 175.0 (H) 02/13/2015   CHOLHDL 3 02/13/2015    Significant Diagnostic Results in last 30 days:  CT Head Wo Contrast  Result Date: 09/11/2019 CLINICAL DATA:  Altered mental status EXAM: CT HEAD WITHOUT CONTRAST TECHNIQUE: Contiguous axial images were obtained from the base of the skull through the vertex without intravenous contrast. COMPARISON:  CT 09/28/2014 FINDINGS: Brain: No evidence of acute infarction, hemorrhage, hydrocephalus, extra-axial collection or mass lesion/mass effect. Symmetric prominence of the ventricles, cisterns and sulci compatible with parenchymal volume loss. Patchy areas of white matter hypoattenuation are most compatible with chronic microvascular angiopathy. Vascular: Atherosclerotic calcification of the carotid siphons. No hyperdense vessel. Skull: No calvarial fracture or suspicious osseous lesion. Small burr holes towards the vertex for stimulator leads. Coiled leads along the scalp. No scalp swelling or hematoma. Sinuses/Orbits: Paranasal sinuses and mastoid air cells are predominantly clear. Included orbital structures are unremarkable. Other: Bilateral deep brain stimulator leads terminate in stable position within the subthalamic region bilaterally. Resulting streak artifact. No evidence of lead discontinuity along the intracranial or extracranial course. IMPRESSION: 1. No acute intracranial findings. 2. Stable parenchymal volume loss and chronic microvascular angiopathy. 3. Bilateral deep brain stimulator leads terminate in stable position within the subthalamic region bilaterally. No evidence of lead discontinuity along the intracranial or extracranial course. Electronically Signed   By: Lovena Le M.D.   On: 09/11/2019 00:09   DG Chest Port 1 View  Result Date: 09/10/2019 CLINICAL DATA:  Altered mental status EXAM: PORTABLE CHEST 1 VIEW COMPARISON:  Radiograph  08/27/2019 FINDINGS: Bilateral neurostimulator generators project over the left and right chest walls without lead discontinuity. Telemetry leads overlie the chest. New opacity is seen in the periphery of the left lung base. No other focal consolidation. Low volumes with atelectasis. No pneumothorax or effusion. No frank edema. Cardiomediastinal contours are similar to prior counting for differences in technique with a calcified aorta. No acute osseous or soft tissue abnormality. IMPRESSION: New opacity in the periphery of the left lung base, concerning for pneumonia or aspiration in the appropriate clinical setting. Electronically Signed   By: Lovena Le M.D.   On: 09/10/2019 23:51   DG Chest Port 1 View  Result Date: 08/27/2019 CLINICAL DATA:  Dyspnea EXAM: PORTABLE CHEST 1 VIEW COMPARISON:  08/02/2019 FINDINGS: The heart size and mediastinal contours are within normal limits. Both lungs are clear. The visualized skeletal structures are unremarkable. IMPRESSION: No acute abnormality of the lungs in AP portable projection. Electronically  Signed   By: Eddie Candle M.D.   On: 08/27/2019 13:17    Assessment/Plan  1. Acute encephalopathy -Thought to be from polypharmacy, was given a dose of Narcan in the ED - will need to consider a gradual dose reduction of her medications -  Will refer to psych NP  2. Acute respiratory failure with hypoxemia ([name]) -Thought to be due to COVID-19 viral pneumonia -Now on room air, resolved  3. Pneumonia due to COVID-19 virus - Azithromycin and Rocephin has been discontinued after 1 dose as procalcitonin was low and she does not have a leukocytosis. She was noted to have minimal elevation in inflammatory markers. -was given steroids and remdesivir on 6/6-6/11.  4. Type II diabetes mellitus with manifestations ([place]) Lab Results  Component Value Date   HGBA1C 7.4 (H) 09/10/2019   -Continue NovoLog sliding scale 3 times a day, Lantus 15 units at bedtime and  Januvia 100 mg 1 tab daily  5. Essential hypertension -Continue Toprol-XL 50 mg 1 tab daily  6. Tremor, essential - has bilateral brain stimulator on chest -Continue primidone 50 mg 1 tab in the morning  7. Other specified hypothyroidism Lab Results  Component Value Date   TSH 1.01 05/21/2015   -Continue Synthroid 25 mcg 1 tab daily  8. Asthma, moderate persistent, poorly-controlled -No wheezing, continue Breo Ellipta and PRN albuterol  9. Chronic low back pain, unspecified back pain laterality, unspecified whether sciatica present -Controlled, continue PRN Norco  10. Major depression, recurrent, chronic ([name]) -Continue Seroquel and Paxil  11.  Seizures -Continue Depakote 500 mg 1 tab 3 times a day and Neurontin 300 mg 1 capsule in the morning and 2 capsules = 600 mg at bedtime  12.  Tardive dyskinesia -Continue Ingrezza 40 mg 1 capsule daily     Family/ staff Communication: Discussed plan of care with resident and charge nurse.  Labs/tests ordered: TSH  Goals of care:   Short-term care   Durenda Age, DNP, FNP-BC [facility] [phone] (Monday-Friday 8:00 a.m. - 5:00 p.m.) [phone] (after hours)

## 2019-09-19 ENCOUNTER — Encounter: Payer: Self-pay | Admitting: Internal Medicine

## 2019-09-19 ENCOUNTER — Non-Acute Institutional Stay (SKILLED_NURSING_FACILITY): Payer: Medicare Other | Admitting: Internal Medicine

## 2019-09-19 ENCOUNTER — Other Ambulatory Visit: Payer: Self-pay | Admitting: Adult Health

## 2019-09-19 DIAGNOSIS — E118 Type 2 diabetes mellitus with unspecified complications: Secondary | ICD-10-CM

## 2019-09-19 DIAGNOSIS — D539 Nutritional anemia, unspecified: Secondary | ICD-10-CM | POA: Diagnosis not present

## 2019-09-19 DIAGNOSIS — D696 Thrombocytopenia, unspecified: Secondary | ICD-10-CM

## 2019-09-19 DIAGNOSIS — J9601 Acute respiratory failure with hypoxia: Secondary | ICD-10-CM

## 2019-09-19 DIAGNOSIS — U071 COVID-19: Secondary | ICD-10-CM

## 2019-09-19 LAB — TSH: TSH: 0.72 (ref 0.41–5.90)

## 2019-09-19 MED ORDER — HYDROCODONE-ACETAMINOPHEN 5-325 MG PO TABS: 1.0000 | ORAL_TABLET | Freq: Three times a day (TID) | ORAL | 0 refills | Status: DC | PRN

## 2019-09-19 NOTE — Patient Instructions (Signed)
See assessment and plan under each diagnosis in the problem list and acutely for this visit 

## 2019-09-19 NOTE — Assessment & Plan Note (Addendum)
Hyperglycemia exacerbated by steroids administered for COVID-19 PNA during 6/6-6/02/2020 hospitalization At SNF FBS 158-170 and p.m. 122-258.  The latter is an outlier as majority of the glucoses are less than 200. No change in present regimen indicated.  Anticipate improvement in glucoses as now off steroids.

## 2019-09-19 NOTE — Assessment & Plan Note (Signed)
Anemia resolved and no bleeding dyscrasias reported.

## 2019-09-19 NOTE — Assessment & Plan Note (Signed)
No bleeding dyscrasias reported by staff

## 2019-09-19 NOTE — Assessment & Plan Note (Signed)
Afebrile with excellent vital signs.  O2 sats low normal.  Continue present pulmonary toilet protocol

## 2019-09-19 NOTE — Progress Notes (Signed)
NURSING HOME LOCATION:  [facility] ROOM NUMBER:  [id number]  CODE STATUS:  FULL CODE  PCP:  Abran Richard, MD  [address] [place] [id number]  This is a comprehensive admission note to [facility] performed on this date less than 30 days from date of admission. Included are preadmission medical/surgical history; reconciled medication list; family history; social history and comprehensive review of systems.  Corrections and additions to the records were documented. Comprehensive physical exam was also performed. Additionally a clinical summary was entered for each active diagnosis pertinent to this admission in the Problem List to enhance continuity of care.  HPI: Patient was hospitalized 6/6-6/02/2020 presenting with AMS in the context of polypharmacy.  CT of the head revealed no acute changes.  Chest x-ray revealed a new opacity in the periphery of the left lung base with mild hypoxemia attributed to COVID-19 infection.  Initially she received 1 dose of Rocephin and azithromycin in ED. She was transferred from [facility] to [facility] 6/7. Steroids and remdesivir initiated 6/6 were subsequently continued until 6/11. Thrombocytopenia was attributed to the viral illness; platelet nadir was 87,000.  At discharge it was 128,000.  Course was complicated by acute urinary retention requiring in and out catheterization on 6/8. The steroids did result in significant hyperglycemia; this did stabilize with completion of the steroids. Mental status improved and primidone, Depakote, Seroquel, and gabapentin were reinitiated.  These were to be held should she develop any signs of sedation/lethargy.  Past medical and surgical history: Includes a history of essential tremors, seizure disorder, essential hypertension, hypothyroidism, insulin-dependent diabetes, history of bronchial asthma, depression, history of tardive dyskinesia, bipolar 1 disorder, vocal cord dysfunction, SVT, and chronic low  back pain on scheduled hydrocodone. Deep brain stimulator had been implanted for essential tremor.  Other surgeries include abdominal hysterectomy, ablation for AVNRT, cholecystectomy, and colon polypectomy.  Social history: Nondrinker; former smoker.  Family history: Extensive history reviewed.   Review of systems: She actually gives a very good history.  She describes "feeling pretty good".  Her cough has improved but she still produces yellow flame.  Dysphagia is a chronic issue.  She also validates she wheezes after fluids intermittently.  She has a neuropathy with tingling in her legs.  Chronic constipation is attributed to her oxycodone.  She describes chronic low back pain as well as pain from bone spurs in her hands.  She made the comment that having back surgery was "the world's worst mistake".  In reference to anemia and thrombocytopenia; she denies any bleeding dyscrasias.  Constitutional: No fever, significant weight change  Eyes: No redness, discharge, pain, vision change ENT/mouth: No nasal congestion, purulent discharge, earache, change in hearing, sore throat  Cardiovascular: No chest pain, palpitations, paroxysmal nocturnal dyspnea, edema  Respiratory: No hemoptysis, DOE, significant snoring, apnea  Gastrointestinal: No heartburn, dysphagia, abdominal pain, nausea /vomiting, rectal bleeding, melena Genitourinary: No dysuria, hematuria, pyuria, incontinence, nocturia Dermatologic: No rash, pruritus, change in appearance of skin Neurologic: No dizziness, headache, syncope, seizures, numbness Psychiatric: No significant anxiety, depression, insomnia, anorexia Endocrine: No change in hair/skin/nails, excessive thirst, excessive hunger, excessive urination  Hematologic/lymphatic: No significant bruising, lymphadenopathy, abnormal bleeding Allergy/immunology: No itchy/watery eyes, significant sneezing, urticaria, angioedema  Physical exam:  Pertinent or positive findings: She has a  high-pitched, squeaky voice.  She speaks in a singsong pattern.  She exhibits a bobbing tremor of her head as well as coarse jerking tremor of the extremities with intentional movements or with opposition.  She is not wearing the upper dental plate.  She has fine hirsutism of the chin.  Breath sounds are decreased in the lower lung fields.  Pedal pulses are decreased.  She has resolving bruising over the dorsum of the right hand.  She is generally weak to opposition but most in the left upper extremity.  General appearance: Adequately nourished; no acute distress, increased work of breathing is present.   Lymphatic: No lymphadenopathy about the head, neck, axilla. Eyes: No conjunctival inflammation or lid edema is present. There is no scleral icterus. Ears:  External ear exam shows no significant lesions or deformities.   Nose:  External nasal examination shows no deformity or inflammation. Nasal mucosa are pink and moist without lesions, exudates Oral exam: Lips and gums are healthy appearing.There is no oropharyngeal erythema or exudate. Neck:  No thyromegaly, masses, tenderness noted.    Heart:  Normal rate and regular rhythm. S1 and S2 normal without gallop, murmur, click, rub.  Lungs: without wheezes, rhonchi, rales, rubs. Abdomen: Bowel sounds are normal.  Abdomen is soft and nontender with no organomegaly, hernias, masses. GU: Deferred  Extremities:  No cyanosis, clubbing, edema. Neurologic exam: Balance, Rhomberg, finger to nose testing could not be completed due to clinical state Skin: Warm & dry w/o tenting. No significant lesions or rash.  See clinical summary under each active problem in the Problem List with associated updated therapeutic plan

## 2019-09-21 LAB — HEPATIC FUNCTION PANEL
ALT: 10 (ref 7–35)
AST: 13 (ref 13–35)
Alkaline Phosphatase: 45 (ref 25–125)
Bilirubin, Total: 0.4

## 2019-09-21 LAB — CBC AND DIFFERENTIAL
HCT: 35 — AB (ref 36–46)
Hemoglobin: 11.8 — AB (ref 12.0–16.0)
Platelets: 82 — AB (ref 150–399)
WBC: 4.5

## 2019-09-21 LAB — BASIC METABOLIC PANEL
BUN: 12 (ref 4–21)
CO2: 23 — AB (ref 13–22)
Chloride: 103 (ref 99–108)
Creatinine: 0.5 (ref 0.5–1.1)
Glucose: 166
Potassium: 3.9 (ref 3.4–5.3)
Sodium: 136 — AB (ref 137–147)

## 2019-09-21 LAB — COMPREHENSIVE METABOLIC PANEL
Albumin: 3.5 (ref 3.5–5.0)
Calcium: 9.2 (ref 8.7–10.7)
GFR calc Af Amer: 90
GFR calc non Af Amer: 90
Globulin: 1.7

## 2019-09-21 LAB — CBC: RBC: 3.46 — AB (ref 3.87–5.11)

## 2019-09-28 ENCOUNTER — Non-Acute Institutional Stay (SKILLED_NURSING_FACILITY): Payer: Medicare Other | Admitting: Adult Health

## 2019-09-28 ENCOUNTER — Encounter: Payer: Self-pay | Admitting: Adult Health

## 2019-09-28 DIAGNOSIS — E118 Type 2 diabetes mellitus with unspecified complications: Secondary | ICD-10-CM

## 2019-09-28 DIAGNOSIS — J4540 Moderate persistent asthma, uncomplicated: Secondary | ICD-10-CM | POA: Diagnosis not present

## 2019-09-28 DIAGNOSIS — U071 COVID-19: Secondary | ICD-10-CM

## 2019-09-28 DIAGNOSIS — G250 Essential tremor: Secondary | ICD-10-CM

## 2019-09-28 DIAGNOSIS — I10 Essential (primary) hypertension: Secondary | ICD-10-CM

## 2019-09-28 DIAGNOSIS — J1282 Pneumonia due to coronavirus disease 2019: Secondary | ICD-10-CM

## 2019-09-28 NOTE — Progress Notes (Signed)
Location:  [facility] Room Number: 654-Y Place of Service:  SNF (31) Provider:  Durenda Age, DNP, FNP-BC  Patient Care Team: Abran Richard, MD as PCP - General (Internal Medicine) Herminio Commons, MD as PCP - Cardiology (Cardiology) Deneise Lever, MD as Consulting Physician (Pulmonary Disease) Minus Breeding, MD as Consulting Physician (Cardiology) Tat, Eustace Quail, DO as Consulting Physician (Neurology) Herminio Commons, MD as Consulting Physician (Cardiology) Gala Romney Cristopher Estimable, MD as Consulting Physician (Gastroenterology)  Extended Emergency Contact Information Primary Emergency Contact: [name], [place] [address] [place] Phone: [phone] Relation: Daughter  Code Status:  FULL CODE  Goals of care: Advanced Directive information Advanced Directives 09/11/2019  Does Patient Have a Medical Advance Directive? No  Type of Advance Directive -  Does patient want to make changes to medical advance directive? -  Copy of [place] in Chart? -  Would patient like information on creating a medical advance directive? Yes (Inpatient - patient defers creating a medical advance directive at this time - Information given)  Pre-existing out of facility DNR order (yellow form or pink MOST form) -     Chief Complaint  Patient presents with  . Medical Management of Chronic Issues    Patient is seen for a routine short-term rehabilitation visit.     HPI:  Pt is a 69 y.o. female seen today for a routine short-term rehabilitation visit.  She has a PMH of bipolar 1 disorder, COPD, DDD, dysphagia, dysarthria, diabetes mellitus, hypertension, SVT, tremor and thyroidism. She was admitted to [facility] on 09/15/2019 post [facility] hospitalization 09/10/2019 to 09/15/2019 for acute toxic encephalopathy.:  09/10/2019, she presented to [facility] for altered mental status secondary to polypharmacy.   She was thought to be in deep sleep and difficult to arouse with verbal and chest rubbing. Her O2 sat was at 97% on room air and suddenly dropped to 84% while in the ED.  UA was negative for UTI.  UDS positive for opiates and barbiturates.  Staff in the skilled nursing facility, where she was living, reported that she most probably was hoarding medications in her room.  She was given a dose of Narcan and became arousable.  Chest x-ray showed bilateral lower lobe infiltrates.  CT head negative for acute intracranial bleed or pathology.  She was given IV ceftriaxone and IV azithromycin in the ED.  COVID-19 test results came back positive.  Azithromycin and Rocephin were discontinued as procalcitonin was low and she does not have a leukocytosis.  She was noted to have minimal elevation in inflammatory markers.  She was a started on IV steroids and remdesivir.  It was continued by SNF facility that she had COVID-19 infection last December and received her vaccine in January/February.  He was subsequently transferred to [facility] on 09/11/2019 for further care.  She was given steroids and remdesivir on 6/6-6/11.  She was seen in her room today and complained of productive phlegm. No reported SOB.  No reported fever.  She takes Breo Ellipta and PRN albuterol for Asthma.  SBP's ranging from 99-127.  She currently takes metoprolol succinate for hypertension.  CBGs ranging from 120-193, with an outlier of 255.  She takes Januvia, Lantus and NovoLog sliding scale for diabetes mellitus.   Past Medical History:  Diagnosis Date  . Allergic rhinitis    uses Nasonex daily as needed  . Allergy to  angiotensin receptor blockers (ARB) 01/22/2014   Angioedema  . Anxiety    takes Ativan daily as needed  . Aspiration pneumonia ([place])   . Asthma    Albuterol inhaler and Neb daily as needed  . 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 [name], residual palpitations  .  Bipolar 1 disorder ([place])   . Chronic back pain    reason unknown  . Constipation   . COPD (chronic obstructive pulmonary disease) ([place])   . DDD (degenerative disc disease), lumbar   . Depression    takes Zoloft daily as well as Cymbalta  . Diabetes mellitus    Type II  . Dysphagia   . Dysrhythmia    SVT ==ablation done by Dr. Rayann Heman 2007  . Esophageal reflux    takes Zantac daily  . Frequent headaches   . History of bronchitis Dec 2014  . Hypertension    takes Losartan daily  . Hypothyroidism    takes Synthroid daily  . Joint swelling    left ankle  . Low sodium syndrome   . Lumbar radiculopathy   . Seizures ([place]) 04-05-13   one time ran out of Primidone and had stopped it cold [place]  . SVT (supraventricular tachycardia) ([place])   . Tremor, essential    takes Mysoline and Neurontin daily.  . Vocal cord dysfunction   . Weakness    in left arm   Past Surgical History:  Procedure Laterality Date  . ABDOMINAL HYSTERECTOMY    . ABLATION FOR AVNRT    . APPENDECTOMY    . BARTHOLIN GLAND CYST EXCISION     x 2  . BIOPSY  03/04/2015   Procedure: BIOPSY (Duodenal, Gastric);  Surgeon: Daneil Dolin, MD;  Location: [facility] OR[facility];  Service: Endoscopy;;  . BIOPSY  12/19/2018   Procedure: BIOPSY;  Surgeon: Daneil Dolin, MD;  Location[facility];  Service: Endoscopy;;  Gastric Biopsies    . BREAST SURGERY    . CESAREAN SECTION    . CHOLECYSTECTOMY    . COLONOSCOPY  01/16/2004   ZDG:LOVFIE rectum/colon  . COLONOSCOPY WITH PROPOFOL N/A 03/04/2015   Dr.[name]- internal hemorrhoids, pancolonic diverticulosis, colonic polyp= tubular adenoma  . DEEP BRAIN STIMULATOR PLACEMENT    . DENTAL SURGERY    . ESOPHAGEAL DILATION N/A 03/04/2015   Procedure: ESOPHAGEAL DILATION WITH 54FR MALONEY DILATOR;  Surgeon: Daneil Dolin, MD;  Location: [facility];  Service: Endoscopy;  Laterality: N/A;  . ESOPHAGEAL DILATION  12/19/2018   Procedure: ESOPHAGEAL DILATION;  Surgeon: Daneil Dolin, MD;   [facility];  Service: Endoscopy;;  . ESOPHAGOGASTRODUODENOSCOPY (EGD) WITH ESOPHAGEAL DILATION  04/03/2002   PPI:RJJOACZY'S ring, otherwise normal esophagus, status post dilation with 56 F/Normal stomach  . ESOPHAGOGASTRODUODENOSCOPY (EGD) WITH ESOPHAGEAL DILATION N/A 11/08/2012   Dr. [name]:schatzki's ring s/p dilation/hiatal hernia  . ESOPHAGOGASTRODUODENOSCOPY (EGD) WITH PROPOFOL N/A 03/04/2015   Dr.[name]- schatzki's ring, hiatal hernia, scattered erosions bx= chronic inactive gastritis  . ESOPHAGOGASTRODUODENOSCOPY (EGD) WITH PROPOFOL N/A 05/06/2017   Normal esophagus. Dilated. Small hiatal hernia. Normal duodenal bulb and second portion of duodenum.  . ESOPHAGOGASTRODUODENOSCOPY (EGD) WITH PROPOFOL N/A 12/19/2018   Procedure: ESOPHAGOGASTRODUODENOSCOPY (EGD) WITH PROPOFOL;  Surgeon: Daneil Dolin, MD;  [facility];  Service: Endoscopy;  Laterality: N/A;  11:15am  . FINGER SURGERY    . HEMORRHOID BANDING  2017   Dr.[name]  . LEFT ANKLE LIGAMENT REPAIR  x 2  . LEFT ELBOW REPAIR    . MALONEY DILATION N/A 05/06/2017   Procedure: Venia Minks DILATION;  Surgeon: Daneil Dolin, MD;  [facility];  Service: Endoscopy;  Laterality: N/A;  . Venia Minks DILATION N/A 12/19/2018   Procedure: Venia Minks DILATION;  Surgeon: Daneil Dolin, MD;  [facility];  Service: Endoscopy;  Laterality: N/A;  . MAXIMUM ACCESS (MAS)POSTERIOR LUMBAR INTERBODY FUSION (PLIF) 1 LEVEL N/A 04/24/2014   Procedure: Lumbar four-five Maximum access Surgery posterior lumbar interbody fusion;  Surgeon: [name], MD;  Location: [facility] NEURO ORS;  Service: Neurosurgery;  Laterality: N/A;  Lumbar four-five Maximum access Surgery posterior lumbar interbody fusion  . MULTIPLE EXTRACTIONS WITH ALVEOLOPLASTY N/A 06/18/2017   Procedure: MULTIPLE EXTRACTION;  Surgeon: Diona Browner, DDS;  Location: [facility];  Service: Oral Surgery;  Laterality: N/A;  . PARTIAL HYSTERECTOMY    . POLYPECTOMY  03/04/2015     Procedure: POLYPECTOMY (descending colon);  Surgeon: Daneil Dolin, MD;  Location: [facility];  Service: Endoscopy;;  . PULSE GENERATOR IMPLANT Bilateral 12/15/2013   Procedure: BILATERAL PULSE GENERATOR IMPLANT;  Surgeon: [name], MD;  Location: [place] NEURO ORS;  Service: Neurosurgery;  Laterality: Bilateral;  BILATERAL  . RIGHT BREAST CYST     benign  . SUBTHALAMIC STIMULATOR BATTERY REPLACEMENT Bilateral 06/24/2018   Procedure: Bilateral implantable pulse generator battery change;  Surgeon: [name], MD;  Location: [facility];  Service: Neurosurgery;  Laterality: Bilateral;  bilateral  . SUBTHALAMIC STIMULATOR INSERTION Bilateral 12/04/2013   Procedure: Bilateral Deep brain stimulator placement;  Surgeon: [name], MD;  Location: [place] NEURO ORS;  Service: Neurosurgery;  Laterality: Bilateral;  Bilateral Deep brain stimulator placement  . TONSILLECTOMY      Allergies  Allergen Reactions  . Invokana [Canagliflozin] Other (See Comments)    Yeast infectios  . Losartan Other (See Comments)    Angioedema   . Metformin And Related Diarrhea  . Ace Inhibitors     History of angioedema with losartan, ARB  . Latex Other (See Comments)    Other Reaction: redness, blisters  . Other Other (See Comments)    Air fresheners - on MAR  . Oxycodone-Acetaminophen Itching and Other (See Comments)    TYLOX. Caused internal itching per patient   . Mucinex [Guaifenesin Er] Other (See Comments)    MUCINEX REACTION: asthma attacks    Outpatient Encounter Medications as of 09/28/2019  Medication Sig  . acetaminophen (TYLENOL) 500 MG tablet Take 500 mg by mouth every 6 (six) hours as needed for mild pain, fever or headache.   . albuterol (PROAIR HFA) 108 (90 Base) MCG/ACT inhaler Inhale 2 puffs every 6 hours for asthma - rescue inhaler  . alum & mag hydroxide-simeth (MAALOX/MYLANTA) 200-200-20 MG/5ML suspension Take 30 mLs by mouth as needed for indigestion or heartburn. Max 4 doses per 24 hours  .  Ascorbic Acid (VITAMIN C WITH ROSE HIPS) 500 MG tablet Take 500 mg by mouth in the morning.  Marland Kitchen aspirin 81 MG chewable tablet Chew 81 mg by mouth in the morning.   . Carboxymethylcellulose Sod PF (REFRESH PLUS) 0.5 % SOLN Place 1 drop into both eyes 2 (two) times daily.   . Cholecalciferol (VITAMIN D-3) 125 MCG (5000 UT) TABS Take 5,000 Units by mouth in the morning.   . divalproex (DEPAKOTE) 500 MG DR tablet Take 500 mg by mouth 3 (three) times daily.   Marland Kitchen docusate sodium (COLACE) 100 MG capsule Take 100 mg by mouth in the  morning.   . fluticasone furoate-vilanterol (BREO ELLIPTA) 100-25 MCG/INH AEPB Inhale 1 puff into the lungs daily.  Marland Kitchen gabapentin (NEURONTIN) 300 MG capsule Take 300 mg by mouth See admin instructions. Take 300 mg by mouth in the morning and 600 mg at bedtime  . glucose 4 GM chewable tablet Chew 5 tablets by mouth as needed for low blood sugar. Recheck FSBS in 15 minutes and repeat if <40  . HYDROcodone-acetaminophen (NORCO/VICODIN) 5-325 MG tablet Take 1 tablet by mouth 3 (three) times daily as needed for moderate pain (Please do not give if sedated or lethargic.).  Marland Kitchen hydrocortisone cream 1 % Apply 1 application topically 2 (two) times daily as needed ((hemorrhoidal flare)).   Marland Kitchen insulin glargine (LANTUS) 100 UNIT/ML injection Inject 15 Units into the skin at bedtime. *Hold if blood glucose is less than 160*  . ipratropium (ATROVENT) 0.03 % nasal spray Place 2 sprays into both nostrils 3 (three) times daily. Before meals  . lactulose (CHRONULAC) 10 GM/15ML solution Take 10 g by mouth daily as needed for mild constipation.  Marland Kitchen levothyroxine (SYNTHROID, LEVOTHROID) 75 MCG tablet TAKE (1) TABLET BY MOUTH ONCE DAILY BEFORE BREAKFAST.  Marland Kitchen loperamide (IMODIUM A-D) 2 MG tablet Take 2 mg by mouth 3 (three) times daily as needed for diarrhea or loose stools.   . lubiprostone (AMITIZA) 8 MCG capsule Take 1 capsule (8 mcg total) by mouth 2 (two) times daily with a meal.  . magnesium hydroxide  (MILK OF MAGNESIA) 400 MG/5ML suspension Take 30 mLs by mouth at bedtime as needed for mild constipation.   . Menthol, Topical Analgesic, (BIOFREEZE) 4 % GEL Apply 1 application topically 2 (two) times daily. Apply to lower back and base of right thumb twice daily.  . metoprolol succinate (TOPROL-XL) 50 MG 24 hr tablet Take 50 mg by mouth in the morning. Take with or immediately following a meal.   . Multiple Vitamins-Minerals (MULTIVITAMINS THER. W/MINERALS) TABS tablet TAKE ONE TABLET BY MOUTH ONCE DAILY.  . mupirocin ointment (BACTROBAN) 2 % Apply 1 application topically daily. Apply to left 5th digit nailbed every day until healed.  Marland Kitchen NOVOLOG FLEXPEN 100 UNIT/ML FlexPen 0-9 Units, Subcutaneous, 3 times daily with meals CBG < 70: Implement Hypoglycemia meaures CBG 70 - 120: 0 units CBG 121 - 150: 1 unit CBG 151 - 200: 2 units CBG 201 - 250: 3 units CBG 251 - 300: 5 units CBG 301 - 350: 7 units CBG 351 - 400: 9 units CBG > 400: call MD  . omega-3 acid ethyl esters (LOVAZA) 1 g capsule Take 1 g by mouth daily.  . pantoprazole (PROTONIX) 40 MG tablet Take 40 mg by mouth daily before breakfast.   . PARoxetine (PAXIL) 10 MG tablet Take 10 mg by mouth daily.   . polyethylene glycol (MIRALAX / GLYCOLAX) packet Take 17 g by mouth daily as needed for moderate constipation.  . potassium chloride SA (K-DUR,KLOR-CON) 20 MEQ tablet Take 20 mEq by mouth daily.  . primidone (MYSOLINE) 50 MG tablet Take 50 mg by mouth in the morning.   Marland Kitchen QUEtiapine (SEROQUEL) 100 MG tablet Take 100 mg by mouth at bedtime.  . simvastatin (ZOCOR) 5 MG tablet Take 5 mg by mouth at bedtime.   . sitaGLIPtin (JANUVIA) 100 MG tablet Take 100 mg by mouth daily.  . sodium chloride (OCEAN) 0.65 % nasal spray Place 2 sprays into the nose as needed (Rhinitis). Both nostrils  . sodium chloride 1 g tablet Take 1 g by  mouth 2 (two) times daily.  Min[place] Tosylate 40 MG CAPS Take 40 mg by mouth daily.  Marland Kitchen zinc gluconate 50 MG  tablet Take 50 mg by mouth daily.  . [DISCONTINUED] ENULOSE 10 GM/15ML SOLN Take 10 g by mouth daily as needed (for mild constipation).   . [DISCONTINUED] ipratropium (ATROVENT) 0.03 % nasal spray 1-2 puffs each nostril 3 times daily, before meals   No facility-administered encounter medications on file as of 09/28/2019.    Review of Systems  GENERAL: No change in appetite, no fatigue, no weight changes, no fever, chills or weakness MOUTH and THROAT: Denies oral discomfort, gingival pain or bleeding RESPIRATORY: no SOB, DOE, wheezing, hemoptysis, +cough,  CARDIAC: No chest pain, edema or palpitations GI: No abdominal pain, diarrhea, constipation, heart burn, nausea or vomiting GU: Denies dysuria, frequency, hematuria or discharge NEUROLOGICAL: Denies dizziness, syncope, numbness, or headache PSYCHIATRIC: Denies feelings of depression or anxiety. No report of hallucinations, insomnia, paranoia, or agitation   Immunization History  Administered Date(s) Administered  . Influenza Split 12/17/2010, 02/16/2012, 12/27/2012, 11/29/2014, 01/04/2018  . Influenza Whole 12/16/2009  . Influenza, High Dose Seasonal PF 12/11/2016  . Influenza, Quadrivalent, Recombinant, Inj, Pf 01/04/2019  . Influenza,inj,Quad PF,6+ Mos 01/18/2013  . Influenza,inj,quad, With Preservative 01/04/2017  . Influenza-Unspecified 12/19/2013, 11/30/2015  . Moderna SARS-COVID-2 Vaccination 04/29/2019, 05/20/2019  . PPD Test 11/21/2014  . Pneumococcal Conjugate-13 03/02/2014  . Pneumococcal Polysaccharide-23 02/08/2004, 04/06/2010  . Tdap 11/21/2008  . Zoster 02/12/2014, 11/28/2014   Pertinent  Health Maintenance Due  Topic Date Due  . PNA vac Low Risk Adult (2 of 2 - PPSV23) 04/07/2015  . URINE MICROALBUMIN  02/13/2016  . OPHTHALMOLOGY EXAM  04/17/2016  . MAMMOGRAM  10/17/2016  . FOOT EXAM  06/17/2019  . INFLUENZA VACCINE  11/05/2019  . HEMOGLOBIN A1C  03/11/2020  . COLONOSCOPY  03/03/2025  . DEXA SCAN  Completed    Fall Risk  09/19/2018 05/26/2018 12/28/2017 08/20/2017 07/15/2017  Falls in the past year? 0 1 Yes No No  Number falls in past yr: 0 0 2 or more - -  Injury with Fall? 0 0 No - -  Risk Factor Category  - - High Fall Risk - -  Risk for fall due to : - - - - -  Follow up - Falls evaluation completed Falls evaluation completed - -     Vitals:   09/28/19 1129  BP: 115/72  Pulse: 70  Resp: 20  Temp: (!) 97.5 F (36.4 C)  TempSrc: Oral  SpO2: 94%  Weight: 183 lb (83 kg)  Height: 5\' 4"  (1.626 m)   Body mass index is 31.41 kg/m.  Physical Exam  GENERAL APPEARANCE: Well nourished. In no acute distress. Obese SKIN:  Skin is warm and dry.  MOUTH and THROAT: Lips are without lesions. Oral mucosa is moist and without lesions. Tongue is normal in shape, size, and color and without lesions RESPIRATORY: Breathing is even & unlabored, BS CTAB CARDIAC: RRR, no murmur,no extra heart sounds, no edema GI: Abdomen soft, normal BS, no masses, no tenderness EXTREMITIES:  Able to move X 4 extremities. NEUROLOGICAL: Has tremors. Speech is clear. Alert and oriented X 3. PSYCHIATRIC:  Affect and behavior are appropriate  Labs reviewed: Recent Labs    09/12/19 0444 09/12/19 0444 09/13/19 0231 09/14/19 0335 09/15/19 0346  NA 138   < > 136 134* 131*  K 3.9   < > 4.3 4.2 3.7  CL 103   < > 101 96*  94*  CO2 26   < > 26 30 31   GLUCOSE 203*   < > 190* 231* 238*  BUN 8   < > 14 15 18   CREATININE 0.54   < > 0.56 0.80 0.73  CALCIUM 8.6*   < > 8.8* 8.8* 8.8*  MG 1.9  --  1.7 1.7  --    < > = values in this interval not displayed.   Recent Labs    09/13/19 0231 09/14/19 0335 09/15/19 0346  AST 14* 10* 13*  ALT 15 12 14   ALKPHOS 40 37* 39  BILITOT 0.6 0.5 0.5  PROT 5.7* 5.3* 5.6*  ALBUMIN 3.0* 2.8* 3.0*   Recent Labs    09/13/19 0231 09/14/19 0335 09/15/19 0346  WBC 5.2 5.1 5.6  NEUTROABS 3.8 3.1 2.9  HGB 11.6* 11.6* 12.6  HCT 34.1* 33.3* 37.1  MCV 99.1 97.7 98.4  PLT 114* 121*  128*   Lab Results  Component Value Date   TSH 0.72 09/19/2019   Lab Results  Component Value Date   HGBA1C 7.4 (H) 09/10/2019   Lab Results  Component Value Date   CHOL 180 02/13/2015   HDL 64.80 02/13/2015   LDLCALC 80 02/13/2015   TRIG 175.0 (H) 02/13/2015   CHOLHDL 3 02/13/2015    Significant Diagnostic Results in last 30 days:  CT Head Wo Contrast  Result Date: 09/11/2019 CLINICAL DATA:  Altered mental status EXAM: CT HEAD WITHOUT CONTRAST TECHNIQUE: Contiguous axial images were obtained from the base of the skull through the vertex without intravenous contrast. COMPARISON:  CT 09/28/2014 FINDINGS: Brain: No evidence of acute infarction, hemorrhage, hydrocephalus, extra-axial collection or mass lesion/mass effect. Symmetric prominence of the ventricles, cisterns and sulci compatible with parenchymal volume loss. Patchy areas of white matter hypoattenuation are most compatible with chronic microvascular angiopathy. Vascular: Atherosclerotic calcification of the carotid siphons. No hyperdense vessel. Skull: No calvarial fracture or suspicious osseous lesion. Small burr holes towards the vertex for stimulator leads. Coiled leads along the scalp. No scalp swelling or hematoma. Sinuses/Orbits: Paranasal sinuses and mastoid air cells are predominantly clear. Included orbital structures are unremarkable. Other: Bilateral deep brain stimulator leads terminate in stable position within the subthalamic region bilaterally. Resulting streak artifact. No evidence of lead discontinuity along the intracranial or extracranial course. IMPRESSION: 1. No acute intracranial findings. 2. Stable parenchymal volume loss and chronic microvascular angiopathy. 3. Bilateral deep brain stimulator leads terminate in stable position within the subthalamic region bilaterally. No evidence of lead discontinuity along the intracranial or extracranial course. Electronically Signed   By: Lovena Le M.D.   On: 09/11/2019  00:09   DG Chest Port 1 View  Result Date: 09/10/2019 CLINICAL DATA:  Altered mental status EXAM: PORTABLE CHEST 1 VIEW COMPARISON:  Radiograph 08/27/2019 FINDINGS: Bilateral neurostimulator generators project over the left and right chest walls without lead discontinuity. Telemetry leads overlie the chest. New opacity is seen in the periphery of the left lung base. No other focal consolidation. Low volumes with atelectasis. No pneumothorax or effusion. No frank edema. Cardiomediastinal contours are similar to prior counting for differences in technique with a calcified aorta. No acute osseous or soft tissue abnormality. IMPRESSION: New opacity in the periphery of the left lung base, concerning for pneumonia or aspiration in the appropriate clinical setting. Electronically Signed   By: Lovena Le M.D.   On: 09/10/2019 23:51    Assessment/Plan  1. Pneumonia due to COVID-19 virus -  S/P steroids and remdesivir on 6/6-6/11  in the hospital - No reported fever - Was started on Tessalon Perles 100 mg TID PRN for cough   2. Type II diabetes mellitus with manifestations ([name]) Lab Results  Component Value Date   HGBA1C 7.4 (H) 09/10/2019   - Stable, continue Januvia, Lantus and NovoLog sliding scale  3. Essential hypertension - Controlled, continue Toprol-XL  4. Asthma, moderate persistent, poorly-controlled - no wheezing/SOB, continue PRN albuterol and Breo Ellipta  5. Tremor, essential - Has bilateral brain stimulator on chest - Continue primidone - Fall precautions    Family/ staff Communication:  Discussed plan of care with resident and charge nurse.  Labs/tests ordered: None  Goals of care:   Short-term care  Durenda Age, DNP, FNP-BC [facility] and [facility] [phone] (Monday-Friday 8:00 a.m. - 5:00 p.m.) [phone] (after hours)

## 2019-09-29 MED ORDER — BENZONATATE 100 MG PO CAPS
100.0000 mg | ORAL_CAPSULE | Freq: Three times a day (TID) | ORAL | 0 refills | Status: DC | PRN
Start: 2019-09-29 — End: 2020-07-01

## 2019-10-02 ENCOUNTER — Non-Acute Institutional Stay (SKILLED_NURSING_FACILITY): Payer: Medicare Other | Admitting: Adult Health

## 2019-10-02 ENCOUNTER — Encounter: Payer: Self-pay | Admitting: Adult Health

## 2019-10-02 DIAGNOSIS — J1282 Pneumonia due to coronavirus disease 2019: Secondary | ICD-10-CM

## 2019-10-02 DIAGNOSIS — G2401 Drug induced subacute dyskinesia: Secondary | ICD-10-CM

## 2019-10-02 DIAGNOSIS — F332 Major depressive disorder, recurrent severe without psychotic features: Secondary | ICD-10-CM

## 2019-10-02 DIAGNOSIS — I10 Essential (primary) hypertension: Secondary | ICD-10-CM

## 2019-10-02 DIAGNOSIS — E118 Type 2 diabetes mellitus with unspecified complications: Secondary | ICD-10-CM | POA: Diagnosis not present

## 2019-10-02 DIAGNOSIS — U071 COVID-19: Secondary | ICD-10-CM | POA: Diagnosis not present

## 2019-10-02 DIAGNOSIS — G250 Essential tremor: Secondary | ICD-10-CM

## 2019-10-02 DIAGNOSIS — E038 Other specified hypothyroidism: Secondary | ICD-10-CM

## 2019-10-02 DIAGNOSIS — J4540 Moderate persistent asthma, uncomplicated: Secondary | ICD-10-CM

## 2019-10-02 DIAGNOSIS — R569 Unspecified convulsions: Secondary | ICD-10-CM

## 2019-10-02 DIAGNOSIS — F3131 Bipolar disorder, current episode depressed, mild: Secondary | ICD-10-CM

## 2019-10-02 NOTE — Progress Notes (Signed)
Location:  [place] Room Number: 119-A Place of Service:  SNF (31) Provider:  Durenda Age, DNP, FNP-BC  Patient Care Team: Abran Richard, MD as PCP - General (Internal Medicine) Herminio Commons, MD as PCP - Cardiology (Cardiology) Deneise Lever, MD as Consulting Physician (Pulmonary Disease) Minus Breeding, MD as Consulting Physician (Cardiology) Tat, Eustace Quail, DO as Consulting Physician (Neurology) Herminio Commons, MD as Consulting Physician (Cardiology) Gala Romney Cristopher Estimable, MD as Consulting Physician (Gastroenterology)  Extended Emergency Contact Information Primary Emergency Contact: [name],  [address] [place] Phone: [phone] Relation: Daughter  Code Status:  Full Code  Goals of care: Advanced Directive information Advanced Directives 09/11/2019  Does Patient Have a Medical Advance Directive? No  Type of Advance Directive -  Does patient want to make changes to medical advance directive? -  Copy of [place] in Chart? -  Would patient like information on creating a medical advance directive? Yes (Inpatient - patient defers creating a medical advance directive at this time - Information given)  Pre-existing out of facility DNR order (yellow form or pink MOST form) -     Chief Complaint  Patient presents with   Discharge Note    Patient is seen for discharge from SNF    HPI:  Pt is a 69 y.o. female who is for discharge to [facility] on 10/03/19.  She was admitted to [place] on 09/15/19 post [facility] hospitalization 09/10/2019 to 09/15/2019 for acute toxic encephalopathy.  On 09/10/2019, she presented to [facility] for altered mental status secondary to polypharmacy.  She was thought to be in deep sleep and difficult to arouse with verbal and chest rubbing.  Her O2 sat was 97% on room air and suddenly dropped to 84% while in the ED.  UA was negative  for UTI.  UDS positive for opiates and barbiturates.  Staff in the skilled nursing facility, where she was sleeping, reported that she most probably was hoarding medications in her room.  She was given a dose of Narcan and became arousable.  Chest x-ray showed bilateral lower lobe infiltrates.  CT head negative for acute intracranial bleed or pathology.  She was given IV ceftriaxone and IV azithromycin in the ED.  COVID-19 test results came back positive.  She was subsequently transferred to [facility] on 09/11/2019 for further care.  She was given steroids and remdesivir on 6/6-6/11.  Azithromycin and Rocephin were discontinued as procalcitonin was low and she does not have leukocytosis.  She was noted to have minimal elevation in inflammatory markers.  She has a PMH of bipolar 1 disorder, COPD, DDD, dysphagia, dysarthria, diabetes mellitus, hypertension, SVT, tremor and hypothyroidism.  Patient was admitted to this facility for short-term rehabilitation after the patient's recent hospitalization.  Patient has completed SNF rehabilitation and therapy has cleared the patient for discharge.   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 [name], residual palpitations   Bipolar 1 disorder ([place])    Chronic back  pain    reason unknown   Constipation    COPD (chronic obstructive pulmonary disease) ([name])    DDD (degenerative disc disease), lumbar    Depression    takes Zoloft daily as well as Cymbalta   Diabetes mellitus    Type II   Dysphagia    Dysrhythmia    SVT ==ablation done by Dr. Rayann Heman 2007   Esophageal reflux    takes Zantac daily     Frequent headaches    History of bronchitis Dec 2014   Hypertension    takes Losartan daily   Hypothyroidism    takes Synthroid daily   Joint swelling    left ankle   Low sodium syndrome    Lumbar radiculopathy    Seizures ([place]) 04-05-13   one time ran out of Primidone and had stopped it cold [place]   SVT (supraventricular tachycardia) ([name])    Tremor, essential    takes Mysoline and Neurontin daily.   Vocal cord dysfunction    Weakness    in left arm   Past Surgical History:  Procedure Laterality Date   ABDOMINAL HYSTERECTOMY     ABLATION FOR AVNRT     APPENDECTOMY     BARTHOLIN GLAND CYST EXCISION     x 2   BIOPSY  03/04/2015   Procedure: BIOPSY (Duodenal, Gastric);  Surgeon: Daneil Dolin, MD;  Location: [facility];  Service: Endoscopy;;   BIOPSY  12/19/2018   Procedure: BIOPSY;  Surgeon: Daneil Dolin, MD;  [facility];  Service: Endoscopy;;  Gastric Biopsies     BREAST SURGERY     CESAREAN SECTION     CHOLECYSTECTOMY     COLONOSCOPY  01/16/2004   MEB:RAXENM rectum/colon   COLONOSCOPY WITH PROPOFOL N/A 03/04/2015   Dr.[name]- internal hemorrhoids, pancolonic diverticulosis, colonic polyp= tubular adenoma   DEEP BRAIN STIMULATOR PLACEMENT     DENTAL SURGERY     ESOPHAGEAL DILATION N/A 03/04/2015   Procedure: ESOPHAGEAL DILATION WITH 54FR MALONEY DILATOR;  Surgeon: Daneil Dolin, MD;  Location: [facility];  Service: Endoscopy;  Laterality: N/A;   ESOPHAGEAL DILATION  12/19/2018   Procedure: ESOPHAGEAL DILATION;  Surgeon: Daneil Dolin, MD;  [facility];  Service: Endoscopy;;   ESOPHAGOGASTRODUODENOSCOPY (EGD) WITH ESOPHAGEAL DILATION  04/03/2002   MHW:[id number] ring, otherwise normal esophagus, status post dilation with 17 F/Normal stomach   ESOPHAGOGASTRODUODENOSCOPY (EGD) WITH ESOPHAGEAL DILATION N/A 11/08/2012   Dr. [name]:schatzki's ring s/p dilation/hiatal hernia   ESOPHAGOGASTRODUODENOSCOPY (EGD) WITH PROPOFOL  N/A 03/04/2015   Dr.[name]- schatzki's ring, hiatal hernia, scattered erosions bx= chronic inactive gastritis   ESOPHAGOGASTRODUODENOSCOPY (EGD) WITH PROPOFOL N/A 05/06/2017   Normal esophagus. Dilated. Small hiatal hernia. Normal duodenal bulb and second portion of duodenum.   ESOPHAGOGASTRODUODENOSCOPY (EGD) WITH PROPOFOL N/A 12/19/2018   Procedure: ESOPHAGOGASTRODUODENOSCOPY (EGD) WITH PROPOFOL;  Surgeon: Daneil Dolin, MD;  [facility];  Service: Endoscopy;  Laterality: N/A;  11:15am   FINGER SURGERY     HEMORRHOID BANDING  2017   Dr.[name]   LEFT ANKLE LIGAMENT REPAIR     x 2   LEFT ELBOW REPAIR     MALONEY DILATION N/A 05/06/2017   Procedure: MALONEY DILATION;  Surgeon: Daneil Dolin, MD;  [facility];  Service: Endoscopy;  Laterality: N/A;   MALONEY DILATION N/A 12/19/2018   Procedure: Venia Minks DILATION;  Surgeon: Daneil Dolin, MD;  [facility];  Service: Endoscopy;  Laterality: N/A;   MAXIMUM ACCESS (  MAS)POSTERIOR LUMBAR INTERBODY FUSION (PLIF) 1 LEVEL N/A 04/24/2014   Procedure: Lumbar four-five Maximum access Surgery posterior lumbar interbody fusion;  Surgeon: [name], MD;  Location: [place] [facility]UR[facility] ORS;  Service: Neurosurgery;  Laterality: N/A;  Lumbar four-five Maximum access Surgery posterior lumbar interbody fusion   MULTIPLE EXTRACTIONS WITH ALVEOLOPLASTY N/A 06/18/2017   Procedure: MULTIPLE EXTRACTION;  Surgeon: Diona Browner, DDS;  Location: [place];  Service: Oral Surgery;  Laterality: N/A;   PARTIAL HYSTERECTOMY     POLYPECTOMY  03/04/2015   Procedure: POLYPECTOMY (descending colon);  Surgeon: Daneil Dolin, MD;  Location: [facility];  Service: Endoscopy;;   PULSE GENERATOR IMPLANT Bilateral 12/15/2013   Procedure: BILATERAL PULSE GENERATOR IMPLANT;  Surgeon: [name], MD;  Location: [place] NEURO ORS;  Service: Neurosurgery;  Laterality: Bilateral;  BILATERAL   RIGHT BREAST CYST     benign   SUBTHALAMIC STIMULATOR BATTERY  REPLACEMENT Bilateral 06/24/2018   Procedure: Bilateral implantable pulse generator battery change;  Surgeon: [name], MD;  Location: [place];  Service: Neurosurgery;  Laterality: Bilateral;  bilateral   SUBTHALAMIC STIMULATOR INSERTION Bilateral 12/04/2013   Procedure: Bilateral Deep brain stimulator placement;  Surgeon: [name], MD;  Location: [place] NEURO ORS;  Service: Neurosurgery;  Laterality: Bilateral;  Bilateral Deep brain stimulator placement   TONSILLECTOMY      Allergies  Allergen Reactions   Invokana [Canagliflozin] Other (See Comments)    Yeast infectios   Losartan Other (See Comments)    Angioedema    Metformin And Related Diarrhea   Ace Inhibitors     History of angioedema with losartan, ARB   Latex Other (See Comments)    Other Reaction: redness, blisters   Other Other (See Comments)    Air fresheners - on MAR   Oxycodone-Acetaminophen Itching and Other (See Comments)    TYLOX. Caused internal itching per patient    Mucinex [Guaifenesin Er] Other (See Comments)    MUCINEX REACTION: asthma attacks    Outpatient Encounter Medications as of 10/02/2019  Medication Sig   acetaminophen (TYLENOL) 500 MG tablet Take 500 mg by mouth every 6 (six) hours as needed for mild pain, fever or headache.    albuterol (PROAIR HFA) 108 (90 Base) MCG/ACT inhaler Inhale 2 puffs every 6 hours for asthma - rescue inhaler   alum & mag hydroxide-simeth (MAALOX/MYLANTA) 200-200-20 MG/5ML suspension Take 30 mLs by mouth as needed for indigestion or heartburn. Max 4 doses per 24 hours   Ascorbic Acid (VITAMIN C WITH ROSE HIPS) 500 MG tablet Take 500 mg by mouth in the morning.   aspirin 81 MG chewable tablet Chew 81 mg by mouth in the morning.    benzonatate (TESSALON PERLES) 100 MG capsule Take 1 capsule (100 mg total) by mouth 3 (three) times daily as needed for cough.   Carboxymethylcellulose Sod PF (REFRESH PLUS) 0.5 % SOLN Place 1 drop into both eyes 2 (two) times  daily.    Cholecalciferol (VITAMIN D-3) 125 MCG (5000 UT) TABS Take 5,000 Units by mouth in the morning.    divalproex (DEPAKOTE) 500 MG DR tablet Take 500 mg by mouth 3 (three) times daily.    docusate sodium (COLACE) 100 MG capsule Take 100 mg by mouth in the morning.    fluticasone furoate-vilanterol (BREO ELLIPTA) 100-25 MCG/INH AEPB Inhale 1 puff into the lungs daily.   gabapentin (NEURONTIN) 300 MG capsule Take 300 mg by mouth See admin instructions. Take 300 mg by mouth in the morning and 600 mg at bedtime  glucose 4 GM chewable tablet Chew 5 tablets by mouth as needed for low blood sugar. Recheck FSBS in 15 minutes and repeat if <40   HYDROcodone-acetaminophen (NORCO/VICODIN) 5-325 MG tablet Take 1 tablet by mouth 3 (three) times daily as needed for moderate pain (Please do not give if sedated or lethargic.).   hydrocortisone cream 1 % Apply 1 application topically 2 (two) times daily as needed ((hemorrhoidal flare)).    insulin glargine (LANTUS) 100 UNIT/ML injection Inject 15 Units into the skin at bedtime. *Hold if blood glucose is less than 160*   ipratropium (ATROVENT) 0.03 % nasal spray Place 2 sprays into both nostrils 3 (three) times daily. Before meals   lactulose (CHRONULAC) 10 GM/15ML solution Take 10 g by mouth daily as needed for mild constipation.   levothyroxine (SYNTHROID, LEVOTHROID) 75 MCG tablet TAKE (1) TABLET BY MOUTH ONCE DAILY BEFORE BREAKFAST.   loperamide (IMODIUM A-D) 2 MG tablet Take 2 mg by mouth 3 (three) times daily as needed for diarrhea or loose stools.    lubiprostone (AMITIZA) 8 MCG capsule Take 1 capsule (8 mcg total) by mouth 2 (two) times daily with a meal.   magnesium hydroxide (MILK OF MAGNESIA) 400 MG/5ML suspension Take 30 mLs by mouth at bedtime as needed for mild constipation.    Menthol, Topical Analgesic, (BIOFREEZE) 4 % GEL Apply 1 application topically 2 (two) times daily. Apply to lower back and base of right thumb twice daily.     metoprolol succinate (TOPROL-XL) 50 MG 24 hr tablet Take 50 mg by mouth in the morning. Take with or immediately following a meal.    Multiple Vitamin (MULTIVITAMIN) tablet Take 1 tablet by mouth daily.   mupirocin ointment (BACTROBAN) 2 % Apply 1 application topically daily. Apply to left 5th digit nailbed every day until healed.   NOVOLOG FLEXPEN 100 UNIT/ML FlexPen 0-9 Units, Subcutaneous, 3 times daily with meals CBG < 70: Implement Hypoglycemia meaures CBG 70 - 120: 0 units CBG 121 - 150: 1 unit CBG 151 - 200: 2 units CBG 201 - 250: 3 units CBG 251 - 300: 5 units CBG 301 - 350: 7 units CBG 351 - 400: 9 units CBG > 400: call MD   omega-3 acid ethyl esters (LOVAZA) 1 g capsule Take 1 g by mouth daily.   pantoprazole (PROTONIX) 40 MG tablet Take 40 mg by mouth daily before breakfast.    PARoxetine (PAXIL) 10 MG tablet Take 10 mg by mouth daily.    polyethylene glycol (MIRALAX / GLYCOLAX) packet Take 17 g by mouth daily as needed for moderate constipation.   potassium chloride SA (K-DUR,KLOR-CON) 20 MEQ tablet Take 20 mEq by mouth daily.   primidone (MYSOLINE) 50 MG tablet Take 50 mg by mouth in the morning.    QUEtiapine (SEROQUEL) 100 MG tablet Take 100 mg by mouth at bedtime.   simvastatin (ZOCOR) 5 MG tablet Take 5 mg by mouth at bedtime.    sitaGLIPtin (JANUVIA) 100 MG tablet Take 100 mg by mouth daily.   sodium chloride (OCEAN) 0.65 % nasal spray Place 2 sprays into the nose as needed (Rhinitis). Both nostrils   sodium chloride 1 g tablet Take 1 g by mouth 2 (two) times daily.   Valbenazine Tosylate 40 MG CAPS Take 40 mg by mouth daily.   zinc gluconate 50 MG tablet Take 50 mg by mouth daily.   [DISCONTINUED] Multiple Vitamins-Minerals (MULTIVITAMINS THER. W/MINERALS) TABS tablet TAKE ONE TABLET BY MOUTH ONCE DAILY.   No  facility-administered encounter medications on file as of 10/02/2019.    Review of Systems  GENERAL: No change in appetite, no fatigue, no  weight changes, no fever, chills or weakness MOUTH and THROAT: Denies oral discomfort, gingival pain or bleeding RESPIRATORY: no cough, SOB, DOE, wheezing, hemoptysis CARDIAC: No chest pain, edema or palpitations GI: No abdominal pain, diarrhea, constipation, heart burn, nausea or vomiting GU: Denies dysuria, frequency, hematuria, incontinence, or discharge NEUROLOGICAL: Denies dizziness, syncope, numbness, or headache PSYCHIATRIC: Denies feelings of depression or anxiety. No report of hallucinations, insomnia, paranoia, or agitation   Immunization History  Administered Date(s) Administered   Influenza Split 12/17/2010, 02/16/2012, 12/27/2012, 11/29/2014, 01/04/2018   Influenza Whole 12/16/2009   Influenza, High Dose Seasonal PF 12/11/2016   Influenza, Quadrivalent, Recombinant, Inj, Pf 01/04/2019   Influenza,inj,Quad PF,6+ Mos 01/18/2013   Influenza,inj,quad, With Preservative 01/04/2017   Influenza-Unspecified 12/19/2013, 11/30/2015   Moderna SARS-COVID-2 Vaccination 04/29/2019, 05/20/2019   PPD Test 11/21/2014   Pneumococcal Conjugate-13 03/02/2014   Pneumococcal Polysaccharide-23 02/08/2004, 04/06/2010   Tdap 11/21/2008   Zoster 02/12/2014, 11/28/2014   Pertinent  Health Maintenance Due  Topic Date Due   PNA vac Low Risk Adult (2 of 2 - PPSV23) 04/07/2015   URINE MICROALBUMIN  02/13/2016   OPHTHALMOLOGY EXAM  04/17/2016   MAMMOGRAM  10/17/2016   FOOT EXAM  06/17/2019   INFLUENZA VACCINE  11/05/2019   HEMOGLOBIN A1C  03/11/2020   COLONOSCOPY  03/03/2025   DEXA SCAN  Completed   Fall Risk  09/19/2018 05/26/2018 12/28/2017 08/20/2017 07/15/2017  Falls in the past year? 0 1 Yes No No  Number falls in past yr: 0 0 2 or more - -  Injury with Fall? 0 0 No - -  Risk Factor Category  - - High Fall Risk - -  Risk for fall due to : - - - - -  Follow up - Falls evaluation completed Falls evaluation completed - -     Vitals:   10/02/19 1258  BP: 116/63    Pulse: 76  Resp: 18  Temp: 97.7 F (36.5 C)  TempSrc: Oral  SpO2: 94%  Weight: 183 lb (83 kg)  Height: 5\' 4"  (1.626 m)   Body mass index is 31.41 kg/m.  Physical Exam  GENERAL APPEARANCE: Well nourished. In no acute distress.  Obese SKIN:  Skin is warm and dry.  MOUTH and THROAT: Lips are without lesions. Oral mucosa is moist and without lesions. Tongue is normal in shape, size, and color and without lesions. No teeth. RESPIRATORY: Breathing is even & unlabored, BS CTAB.  Has bilateral chest brain stimulator CARDIAC: RRR, no murmur,no extra heart sounds, no edema GI: Abdomen soft, normal BS, no masses, no tenderness NEUROLOGICAL: Has tremors. Speech is clear. Alert and oriented X 3.  PSYCHIATRIC:  Affect and behavior are appropriate  Labs reviewed: Recent Labs    09/12/19 0444 09/12/19 0444 09/13/19 0231 09/13/19 0231 09/14/19 0335 09/15/19 0346 09/21/19 0000  NA 138   < > 136   < > 134* 131* 136*  K 3.9   < > 4.3   < > 4.2 3.7 3.9  CL 103   < > 101   < > 96* 94* 103  CO2 26   < > 26   < > 30 31 23*  GLUCOSE 203*   < > 190*  --  231* 238*  --   BUN 8   < > 14   < > 15 18 12   CREATININE 0.54   < >  0.56   < > 0.80 0.73 0.5  CALCIUM 8.6*   < > 8.8*   < > 8.8* 8.8* 9.2  MG 1.9  --  1.7  --  1.7  --   --    < > = values in this interval not displayed.   Recent Labs    09/13/19 0231 09/13/19 0231 09/14/19 0335 09/15/19 0346 09/21/19 0000  AST 14*   < > 10* 13* 13  ALT 15   < > 12 14 10   ALKPHOS 40   < > 37* 39 45  BILITOT 0.6  --  0.5 0.5  --   PROT 5.7*  --  5.3* 5.6*  --   ALBUMIN 3.0*   < > 2.8* 3.0* 3.5   < > = values in this interval not displayed.   Recent Labs    09/13/19 0231 09/13/19 0231 09/14/19 0335 09/15/19 0346 09/21/19 0000  WBC 5.2   < > 5.1 5.6 4.5  NEUTROABS 3.8  --  3.1 2.9  --   HGB 11.6*   < > 11.6* 12.6 11.8*  HCT 34.1*   < > 33.3* 37.1 35*  MCV 99.1  --  97.7 98.4  --   PLT 114*   < > 121* 128* 82*   < > = values in this  interval not displayed.   Lab Results  Component Value Date   TSH 0.72 09/19/2019   Lab Results  Component Value Date   HGBA1C 7.4 (H) 09/10/2019   Lab Results  Component Value Date   CHOL 180 02/13/2015   HDL 64.80 02/13/2015   LDLCALC 80 02/13/2015   TRIG 175.0 (H) 02/13/2015   CHOLHDL 3 02/13/2015    Significant Diagnostic Results in last 30 days:  CT Head Wo Contrast  Result Date: 09/11/2019 CLINICAL DATA:  Altered mental status EXAM: CT HEAD WITHOUT CONTRAST TECHNIQUE: Contiguous axial images were obtained from the base of the skull through the vertex without intravenous contrast. COMPARISON:  CT 09/28/2014 FINDINGS: Brain: No evidence of acute infarction, hemorrhage, hydrocephalus, extra-axial collection or mass lesion/mass effect. Symmetric prominence of the ventricles, cisterns and sulci compatible with parenchymal volume loss. Patchy areas of white matter hypoattenuation are most compatible with chronic microvascular angiopathy. Vascular: Atherosclerotic calcification of the carotid siphons. No hyperdense vessel. Skull: No calvarial fracture or suspicious osseous lesion. Small burr holes towards the vertex for stimulator leads. Coiled leads along the scalp. No scalp swelling or hematoma. Sinuses/Orbits: Paranasal sinuses and mastoid air cells are predominantly clear. Included orbital structures are unremarkable. Other: Bilateral deep brain stimulator leads terminate in stable position within the subthalamic region bilaterally. Resulting streak artifact. No evidence of lead discontinuity along the intracranial or extracranial course. IMPRESSION: 1. No acute intracranial findings. 2. Stable parenchymal volume loss and chronic microvascular angiopathy. 3. Bilateral deep brain stimulator leads terminate in stable position within the subthalamic region bilaterally. No evidence of lead discontinuity along the intracranial or extracranial course. Electronically Signed   By: Lovena Le M.D.    On: 09/11/2019 00:09   DG Chest Port 1 View  Result Date: 09/10/2019 CLINICAL DATA:  Altered mental status EXAM: PORTABLE CHEST 1 VIEW COMPARISON:  Radiograph 08/27/2019 FINDINGS: Bilateral neurostimulator generators project over the left and right chest walls without lead discontinuity. Telemetry leads overlie the chest. New opacity is seen in the periphery of the left lung base. No other focal consolidation. Low volumes with atelectasis. No pneumothorax or effusion. No frank edema. Cardiomediastinal contours are similar  to prior counting for differences in technique with a calcified aorta. No acute osseous or soft tissue abnormality. IMPRESSION: New opacity in the periphery of the left lung base, concerning for pneumonia or aspiration in the appropriate clinical setting. Electronically Signed   By: Lovena Le M.D.   On: 09/10/2019 23:51    Assessment/Plan  1. Pneumonia due to COVID-19 virus -  S/P steroids and remdesivir on 6/6-6/11 in the hospital -Continue Tessalon perles 100 mg 3 times daily as needed for cough  2. Essential hypertension -Controlled, continue metoprolol succinate ER 50 mg 1 daily  3. Tremor, essential -Continue primidone 50 mg 1 tab daily  4. Type II diabetes mellitus with manifestations ([name]) Lab Results  Component Value Date   HGBA1C 7.4 (H) 09/10/2019   -Continue Januvia 100 mg 1 tab daily, Lantus 15 units subcutaneously at Bedtime and NovoLog sliding scale TID  5. Asthma, moderate persistent, poorly-controlled -No wheezing -Continue Breo Ellipta 100-25 mcg INH inhale 1 puff into the lungs daily  6.  Tardive dyskinesia -, Continue Ingrezza 40 mg 1 capsule daily  7.  Seizure -Continue gabapentin 200 mg 1 capsule in the morning and 2 capsules = 600 mg at bedtime and divalproex 500 mg 1 tab 3 times a day  8.  Major depression disorder, recurrent severe, without psychosis -Continue paroxetine 10 mg 1 tab daily  9.  Bipolar affective disorder -Continue  quetiapine 100 mg 1 tablet at bedtime  10.  Hypothyroidism Lab Results  Component Value Date   TSH 0.72 09/19/2019   Continue levothyroxine 75 mcg 1 tab daily   I have filled out patient's discharge paperwork.  DME provided: None  Total discharge time: Greater than 30 minutes Greater than 50% was spent in counseling and coordination of care.  Discharge time involved coordination of the discharge process with social worker, nursing staff and therapy department.    Durenda Age, DNP, FNP-BC [facility] [phone] (Monday-Friday 8:00 a.m. - 5:00 p.m.) [phone] (after hours)

## 2019-10-21 ENCOUNTER — Other Ambulatory Visit: Payer: Self-pay

## 2019-10-21 ENCOUNTER — Emergency Department (HOSPITAL_COMMUNITY)
Admission: EM | Admit: 2019-10-21 | Discharge: 2019-10-22 | Disposition: A | Discharge status: Home / Self Care | Payer: Medicare Other | Attending: Emergency Medicine | Admitting: Emergency Medicine

## 2019-10-21 ENCOUNTER — Encounter (HOSPITAL_COMMUNITY): Payer: Self-pay | Admitting: Emergency Medicine

## 2019-10-21 DIAGNOSIS — Z87891 Personal history of nicotine dependence: Secondary | ICD-10-CM | POA: Diagnosis not present

## 2019-10-21 DIAGNOSIS — I10 Essential (primary) hypertension: Secondary | ICD-10-CM | POA: Diagnosis not present

## 2019-10-21 DIAGNOSIS — Z7951 Long term (current) use of inhaled steroids: Secondary | ICD-10-CM | POA: Diagnosis not present

## 2019-10-21 DIAGNOSIS — J449 Chronic obstructive pulmonary disease, unspecified: Secondary | ICD-10-CM | POA: Diagnosis not present

## 2019-10-21 DIAGNOSIS — E039 Hypothyroidism, unspecified: Secondary | ICD-10-CM | POA: Insufficient documentation

## 2019-10-21 DIAGNOSIS — Z91040 Latex allergy status: Secondary | ICD-10-CM | POA: Insufficient documentation

## 2019-10-21 DIAGNOSIS — J45909 Unspecified asthma, uncomplicated: Secondary | ICD-10-CM | POA: Diagnosis not present

## 2019-10-21 DIAGNOSIS — R21 Rash and other nonspecific skin eruption: Secondary | ICD-10-CM | POA: Diagnosis not present

## 2019-10-21 MED ORDER — MUPIROCIN CALCIUM 2 % EX CREA
1.0000 | TOPICAL_CREAM | Freq: Two times a day (BID) | CUTANEOUS | 0 refills | Status: DC
Start: 2019-10-21 — End: 2020-05-14

## 2019-10-21 NOTE — Discharge Instructions (Signed)
Rash on your abdomen may be impetigo.  This is a superficial infraction, that tends to spread.  However it may be a simple allergic/irritant reaction.  The recommended treatment is to cleanse daily with soap and water, rinse well and get dry.  After that apply an antibiotic ointment.  We are giving you a prescription of Bactroban to use twice a day.  In the meantime, use a triple antibiotic ointment 2 or 3 times a day to help improve the condition.  See your doctor next week if not improving.

## 2019-10-21 NOTE — ED Triage Notes (Signed)
Pt from [place]. Pt C/O rash and blisters to her abdomen X 2 weeks.

## 2019-10-21 NOTE — ED Provider Notes (Signed)
[facility] EMERGENCY DEPARTMENT Provider Note   CSN: [id number] Arrival date & time: 10/21/19  2036     History Chief Complaint  Patient presents with  . Rash    [name] is a 69 y.o. female.  HPI She is here for evaluation of rash on her abdomen is been present for 2 weeks.  She states that it is irritating, but not particularly painful.  No prior similar problem.  No other symptoms.  She has been using triple antibiotic cream on it to treat it, with some improvement.  She denies nausea, vomiting, fever, chills, weakness or dizziness.  She has not had any bleeding from her mouth.  There are no other known modifying factors.    Past Medical History:  Diagnosis Date  . Allergic rhinitis    uses Nasonex daily as needed  . Allergy to angiotensin receptor blockers (ARB) 01/22/2014   Angioedema  . Anxiety    takes Ativan daily as needed  . Aspiration pneumonia ([place])   . Asthma    Albuterol inhaler and Neb daily as needed  . 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 [name], residual palpitations  . Bipolar 1 disorder ([place])   . Chronic back pain    reason unknown  . Constipation   . COPD (chronic obstructive pulmonary disease) ([place])   . DDD (degenerative disc disease), lumbar   . Depression    takes Zoloft daily as well as Cymbalta  . Diabetes mellitus    Type II  . Dysphagia   . Dysrhythmia    SVT ==ablation done by Dr. Rayann Heman 2007  . Esophageal reflux    takes Zantac daily  . Frequent headaches   . History of bronchitis Dec 2014  . Hypertension    takes Losartan daily  . Hypothyroidism    takes Synthroid daily  . Joint swelling    left ankle  . Low sodium syndrome   . Lumbar radiculopathy   . Seizures ([place]) 04-05-13   one time ran out of Primidone and had stopped it cold [place]  . SVT (supraventricular tachycardia) ([place])   . Tremor, essential    takes Mysoline and Neurontin daily.  . Vocal cord  dysfunction   . Weakness    in left arm    Patient Active Problem List   Diagnosis Date Noted  . Macrocytic anemia 09/19/2019  . Thrombocytopenia ([place]) 09/19/2019  . Chronic back pain   . Altered mental status 09/11/2019  . Opioid overdose ([place]) 09/11/2019  . Hypotension 09/11/2019  . Acute hypoxemic respiratory failure due to COVID-19 ([place]) 09/11/2019  . Uncomplicated asthma 14/43/1540  . Skin erythema 09/28/2018  . Umbilical pain 08/67/6195  . Constipation 03/18/2017  . CAP (community acquired pneumonia) 04/21/2016  . History of colonic polyps   . Diverticulosis of colon without hemorrhage   . Other hemorrhoids   . [name]'s ring   . Mucosal abnormality of stomach   . Dysphagia   . Encounter for screening colonoscopy 02/21/2015  . Upper airway cough syndrome 01/18/2015  . OAB (overactive bladder) 12/30/2014  . Incontinence in female 12/25/2014  . MDD (major depressive disorder), recurrent severe, without psychosis ([place]) 12/14/2014  . Thrush of mouth and esophagus ([place]) 11/23/2014  . Depression with somatization 11/05/2014  . Type II diabetes mellitus with manifestations ([place]) 10/23/2014  . Visit for screening mammogram 08/21/2014  . Hypokalemia 05/30/2014  . Tinea corporis 05/30/2014  . Lumbar scoliosis 04/24/2014  .  Obstructive sleep apnea 04/23/2014  . Moderate persistent asthma with acute bronchitis and acute exacerbation 03/02/2014  . Allergy to angiotensin receptor blockers (ARB) 01/22/2014  . S/P deep brain stimulator placement 01/10/2014  . Greater trochanteric bursitis of left hip 11/07/2013  . Spinal stenosis of lumbar region 10/30/2013  . Right thyroid nodule 10/12/2013  . CMC arthritis, thumb, degenerative 06/15/2013  . GERD (gastroesophageal reflux disease) 11/03/2012  . IBS (irritable bowel syndrome) 11/03/2012  . Esophageal stenosis 11/01/2012  . Asthma, moderate persistent, poorly-controlled 09/01/2012  . Hypothyroidism 12/22/2011  . Hyperlipidemia  with target LDL less than 100 06/22/2008  . Essential hypertension 09/04/2007  . Tremor, essential 04/19/2007  . Seasonal and perennial allergic rhinitis 04/19/2007    Past Surgical History:  Procedure Laterality Date  . ABDOMINAL HYSTERECTOMY    . ABLATION FOR AVNRT    . APPENDECTOMY    . BARTHOLIN GLAND CYST EXCISION     x 2  . BIOPSY  03/04/2015   Procedure: BIOPSY (Duodenal, Gastric);  Surgeon: Daneil Dolin, MD;  Location: [facility] OR[facility];  Service: Endoscopy;;  . BIOPSY  12/19/2018   Procedure: BIOPSY;  Surgeon: Daneil Dolin, MD;  Location: [facility];  Service: Endoscopy;;  Gastric Biopsies    . BREAST SURGERY    . CESAREAN SECTION    . CHOLECYSTECTOMY    . COLONOSCOPY  01/16/2004   [id number] rectum/colon  . COLONOSCOPY WITH PROPOFOL N/A 03/04/2015   Dr.[name]- internal hemorrhoids, pancolonic diverticulosis, colonic polyp= tubular adenoma  . DEEP BRAIN STIMULATOR PLACEMENT    . DENTAL SURGERY    . ESOPHAGEAL DILATION N/A 03/04/2015   Procedure: ESOPHAGEAL DILATION WITH 54FR MALONEY DILATOR;  Surgeon: Daneil Dolin, MD;  Location: [facility];  Service: Endoscopy;  Laterality: N/A;  . ESOPHAGEAL DILATION  12/19/2018   Procedure: ESOPHAGEAL DILATION;  Surgeon: Daneil Dolin, MD;  [facility];  Service: Endoscopy;;  . ESOPHAGOGASTRODUODENOSCOPY (EGD) WITH ESOPHAGEAL DILATION  04/03/2002   LEX:[id number] ring, otherwise normal esophagus, status post dilation with 56 F/Normal stomach  . ESOPHAGOGASTRODUODENOSCOPY (EGD) WITH ESOPHAGEAL DILATION N/A 11/08/2012   Dr. [name]:schatzki's ring s/p dilation/hiatal hernia  . ESOPHAGOGASTRODUODENOSCOPY (EGD) WITH PROPOFOL N/A 03/04/2015   Dr.[name]- schatzki's ring, hiatal hernia, scattered erosions bx= chronic inactive gastritis  . ESOPHAGOGASTRODUODENOSCOPY (EGD) WITH PROPOFOL N/A 05/06/2017   Normal esophagus. Dilated. Small hiatal hernia. Normal duodenal bulb and second portion of duodenum.  . ESOPHAGOGASTRODUODENOSCOPY (EGD)  WITH PROPOFOL N/A 12/19/2018   Procedure: ESOPHAGOGASTRODUODENOSCOPY (EGD) WITH PROPOFOL;  Surgeon: Daneil Dolin, MD;  [facility];  Service: Endoscopy;  Laterality: N/A;  11:15am  . FINGER SURGERY    . HEMORRHOID BANDING  2017   Dr.[name]  . LEFT ANKLE LIGAMENT REPAIR     x 2  . LEFT ELBOW REPAIR    . MALONEY DILATION N/A 05/06/2017   Procedure: Venia Minks DILATION;  Surgeon: Daneil Dolin, MD;  [facility];  Service: Endoscopy;  Laterality: N/A;  . Venia Minks DILATION N/A 12/19/2018   Procedure: Venia Minks DILATION;  Surgeon: Daneil Dolin, MD;  [facility];  Service: Endoscopy;  Laterality: N/A;  . MAXIMUM ACCESS (MAS)POSTERIOR LUMBAR INTERBODY FUSION (PLIF) 1 LEVEL N/A 04/24/2014   Procedure: Lumbar four-five Maximum access Surgery posterior lumbar interbody fusion;  Surgeon: [name], MD;  Location: [place] NEURO ORS;  Service: Neurosurgery;  Laterality: N/A;  Lumbar four-five Maximum access Surgery posterior lumbar interbody fusion  . MULTIPLE EXTRACTIONS WITH ALVEOLOPLASTY N/A 06/18/2017   Procedure:  MULTIPLE EXTRACTION;  Surgeon: Diona Browner, DDS;  Location: [place];  Service: Oral Surgery;  Laterality: N/A;  . PARTIAL HYSTERECTOMY    . POLYPECTOMY  03/04/2015   Procedure: POLYPECTOMY (descending colon);  Surgeon: Daneil Dolin, MD;  Location: [facility];  Service: Endoscopy;;  . PULSE GENERATOR IMPLANT Bilateral 12/15/2013   Procedure: BILATERAL PULSE GENERATOR IMPLANT;  Surgeon: [name], MD;  Location: [place] NEURO ORS;  Service: Neurosurgery;  Laterality: Bilateral;  BILATERAL  . RIGHT BREAST CYST     benign  . SUBTHALAMIC STIMULATOR BATTERY REPLACEMENT Bilateral 06/24/2018   Procedure: Bilateral implantable pulse generator battery change;  Surgeon: [name], MD;  Location: [place];  Service: Neurosurgery;  Laterality: Bilateral;  bilateral  . SUBTHALAMIC STIMULATOR INSERTION Bilateral 12/04/2013   Procedure: Bilateral Deep brain stimulator placement;   Surgeon: [name], MD;  Location: [place] NEURO ORS;  Service: Neurosurgery;  Laterality: Bilateral;  Bilateral Deep brain stimulator placement  . TONSILLECTOMY       OB History    Gravida  2   Para  2   Term  2   Preterm      AB      Living  1     SAB      TAB      Ectopic      Multiple      Live Births              Family History  Problem Relation Age of Onset  . Lung cancer Father        DIED AGE 76 LUNG CA  . Alcohol abuse Father   . Anxiety disorder Father   . Depression Father   . COPD Mother        DIED AGE 23,MRSA,COPD,PNEUMONIA  . Pneumonia Mother        DIED AGE 23,MRSA,COPD,PNEUMONIA  . Supraventricular tachycardia Mother   . Anxiety disorder Mother   . Depression Mother   . Alcohol abuse Mother   . Heart disease Sister        DIED AGE 1, SMOKER,?HEART  . COPD Brother        AGE 67  . Colon cancer Neg Hx     Social History   Tobacco Use  . Smoking status: Former Smoker    Packs/day: 0.30    Years: 10.00    Pack years: 3.00    Types: Cigarettes    Quit date: 04/23/1993    Years since quitting: 26.5  . Smokeless tobacco: Never Used  . Tobacco comment: quit smoking 25+yrs ago  Vaping Use  . Vaping Use: Never used  Substance Use Topics  . Alcohol use: No    Alcohol/week: 0.0 standard drinks  . Drug use: No    Home Medications Prior to Admission medications   Medication Sig Start Date End Date Taking? Authorizing Provider  acetaminophen (TYLENOL) 500 MG tablet Take 500 mg by mouth every 6 (six) hours as needed for mild pain, fever or headache.     [provider]  albuterol (PROAIR HFA) 108 (90 Base) MCG/ACT inhaler Inhale 2 puffs every 6 hours for asthma - rescue inhaler 05/28/17   Baird Lyons D, MD  alum & mag hydroxide-simeth (MAALOX/MYLANTA) 200-200-20 MG/5ML suspension Take 30 mLs by mouth as needed for indigestion or heartburn. Max 4 doses per 24 hours    [provider]  Ascorbic Acid (VITAMIN C WITH ROSE  HIPS) 500 MG tablet Take 500 mg by mouth in the morning.  [provider]  aspirin 81 MG chewable tablet Chew 81 mg by mouth in the morning.     [provider]  benzonatate (TESSALON PERLES) 100 MG capsule Take 1 capsule (100 mg total) by mouth 3 (three) times daily as needed for cough. 09/29/19   Medina-Vargas, Monina C, NP  Carboxymethylcellulose Sod PF (REFRESH PLUS) 0.5 % SOLN Place 1 drop into both eyes 2 (two) times daily.     [provider]  Cholecalciferol (VITAMIN D-3) 125 MCG (5000 UT) TABS Take 5,000 Units by mouth in the morning.     [provider]  divalproex (DEPAKOTE) 500 MG DR tablet Take 500 mg by mouth 3 (three) times daily.     [provider]  docusate sodium (COLACE) 100 MG capsule Take 100 mg by mouth in the morning.     [provider]  fluticasone furoate-vilanterol (BREO ELLIPTA) 100-25 MCG/INH AEPB Inhale 1 puff into the lungs daily. 12/17/16   Baird Lyons D, MD  gabapentin (NEURONTIN) 300 MG capsule Take 300 mg by mouth See admin instructions. Take 300 mg by mouth in the morning and 600 mg at bedtime    [provider]  glucose 4 GM chewable tablet Chew 5 tablets by mouth as needed for low blood sugar. Recheck FSBS in 15 minutes and repeat if <40    [provider]  HYDROcodone-acetaminophen (NORCO/VICODIN) 5-325 MG tablet Take 1 tablet by mouth 3 (three) times daily as needed for moderate pain (Please do not give if sedated or lethargic.). 09/19/19   Medina-Vargas, Monina C, NP  hydrocortisone cream 1 % Apply 1 application topically 2 (two) times daily as needed ((hemorrhoidal flare)).     [provider]  insulin glargine (LANTUS) 100 UNIT/ML injection Inject 15 Units into the skin at bedtime. *Hold if blood glucose is less than 160*    [provider]  ipratropium (ATROVENT) 0.03 % nasal spray Place 2 sprays into both nostrils 3 (three) times daily. Before meals    [provider]  lactulose (CHRONULAC) 10 GM/15ML solution Take 10 g by mouth daily as needed for mild constipation.    [provider]  levothyroxine (SYNTHROID, LEVOTHROID) 75 MCG tablet TAKE (1) TABLET BY MOUTH ONCE DAILY BEFORE BREAKFAST. 01/12/15   Janith Lima, MD  loperamide (IMODIUM A-D) 2 MG tablet Take 2 mg by mouth 3 (three) times daily as needed for diarrhea or loose stools.     [provider]  lubiprostone (AMITIZA) 8 MCG capsule Take 1 capsule (8 mcg total) by mouth 2 (two) times daily with a meal. 06/27/19   Carlis Stable, NP  magnesium hydroxide (MILK OF MAGNESIA) 400 MG/5ML suspension Take 30 mLs by mouth at bedtime as needed for mild constipation.     [provider]  Menthol, Topical Analgesic, (BIOFREEZE) 4 % GEL Apply 1 application topically 2 (two) times daily. Apply to lower back and base of right thumb twice daily.    [provider]  metoprolol succinate (TOPROL-XL) 50 MG 24 hr tablet Take 50 mg by mouth in the morning. Take with or immediately following a meal.     [provider]  Multiple Vitamin (MULTIVITAMIN) tablet Take 1 tablet by mouth daily.    [provider]  mupirocin cream (BACTROBAN) 2 % Apply 1 application topically 2 (two) times daily. Apply to rash on abdomen 10/21/19   Daleen Bo, MD  mupirocin ointment (BACTROBAN) 2 % Apply 1 application topically daily. Apply  to left 5th digit nailbed every day until healed. 09/08/19   [provider]  NOVOLOG FLEXPEN 100 UNIT/ML FlexPen 0-9 Units, Subcutaneous, 3 times daily with meals CBG < 70: Implement Hypoglycemia meaures CBG 70 - 120: 0 units CBG 121 - 150: 1 unit CBG 151 - 200: 2 units CBG 201 - 250: 3 units CBG 251 - 300: 5 units CBG 301 - 350: 7 units CBG 351 - 400: 9 units CBG > 400: call MD 09/15/19   Jonetta Osgood, MD  omega-3 acid ethyl esters (LOVAZA) 1 g capsule Take 1 g by mouth daily.    [provider]  pantoprazole  (PROTONIX) 40 MG tablet Take 40 mg by mouth daily before breakfast.     [provider]  PARoxetine (PAXIL) 10 MG tablet Take 10 mg by mouth daily.  10/07/18   [provider]  polyethylene glycol (MIRALAX / GLYCOLAX) packet Take 17 g by mouth daily as needed for moderate constipation. 10/01/17   Orpah Greek, MD  potassium chloride SA (K-DUR,KLOR-CON) 20 MEQ tablet Take 20 mEq by mouth daily.    [provider]  primidone (MYSOLINE) 50 MG tablet Take 50 mg by mouth in the morning.     [provider]  QUEtiapine (SEROQUEL) 100 MG tablet Take 100 mg by mouth at bedtime.    [provider]  simvastatin (ZOCOR) 5 MG tablet Take 5 mg by mouth at bedtime.  08/21/17   [provider]  sitaGLIPtin (JANUVIA) 100 MG tablet Take 100 mg by mouth daily.    [provider]  sodium chloride (OCEAN) 0.65 % nasal spray Place 2 sprays into the nose as needed (Rhinitis). Both nostrils    [provider]  sodium chloride 1 g tablet Take 1 g by mouth 2 (two) times daily.    [provider]  Valbenazine Tosylate 40 MG CAPS Take 40 mg by mouth daily.    [provider]  zinc gluconate 50 MG tablet Take 50 mg by mouth daily.    [provider]    Allergies    Invokana [canagliflozin], Losartan, Metformin and related, Ace inhibitors, Latex, Other, Oxycodone-acetaminophen, and Mucinex [guaifenesin er]  Review of Systems   Review of Systems  All other systems reviewed and are negative.   Physical Exam Updated Vital Signs BP (!) 125/95   Pulse 73   Temp 98.8 F (37.1 C) (Oral)   Resp 18   SpO2 95%   Physical Exam Vitals and nursing note reviewed.  Constitutional:      Appearance: She is well-developed.  HENT:     Head: Normocephalic and atraumatic.     Right Ear: External ear normal.     Left Ear: External ear normal.  Eyes:     Conjunctiva/sclera: Conjunctivae normal.     Pupils: Pupils are equal,  round, and reactive to light.  Neck:     Trachea: Phonation normal.  Cardiovascular:     Rate and Rhythm: Normal rate.  Pulmonary:     Effort: Pulmonary effort is normal.  Abdominal:     General: There is no distension.  Musculoskeletal:        General: Normal range of motion.     Cervical back: Normal range of motion and neck supple.  Skin:    General: Skin is warm and dry.     Comments: Superficial rash on abdomen, with varied appearance.  No areas of superficial skin breakdown, without ulcers.  There are  areas of drainage, with crusting.  There are a couple small superficial blisters which contain serosanguineous material.  There is no tenderness to touch the rash or the abdominal wall.  There are no areas of petechiae, or frank bleeding.  Neurological:     Mental Status: She is alert and oriented to person, place, and time.     Cranial Nerves: No cranial nerve deficit.     Sensory: No sensory deficit.     Motor: No abnormal muscle tone.     Coordination: Coordination normal.  Psychiatric:        Mood and Affect: Mood normal.        Behavior: Behavior normal.        Thought Content: Thought content normal.        Judgment: Judgment normal.     ED Results / Procedures / Treatments   Labs (all labs ordered are listed, but only abnormal results are displayed) Labs Reviewed - No data to display  EKG None  Radiology No results found.  Procedures Procedures (including critical care time)  Medications Ordered in ED Medications - No data to display  ED Course  I have reviewed the triage vital signs and the nursing notes.  Pertinent labs & imaging results that were available during my care of the patient were reviewed by me and considered in my medical decision making (see chart for details).    MDM Rules/Calculators/A&P                           Patient Vitals for the past 24 hrs:  BP Temp Temp src Pulse Resp SpO2  10/21/19 2100 (!) 125/95 -- -- 73 -- 95 %   10/21/19 2044 (!) 131/111 98.8 F (37.1 C) Oral 75 18 97 %    9:20 PM Reevaluation with update and discussion. After initial assessment and treatment, an updated evaluation reveals there are no further complaints.  Patient discussed with things appropriately, and is lucid.  All questions answered. Daleen Bo   Medical Decision Making:  This patient is presenting for evaluation of rash, present for 2 weeks, which does not require a range of treatment options, and is not a complaint that involves a high risk of morbidity and mortality. The differential diagnoses include allergic dermatitis, superficial skin infection, systemic illness. I decided to review old records, and in summary elderly female who is a nursing care facility, and manages her own care there..  I did not require additional historical information from anyone.    Critical Interventions-clinical evaluation, discussion with patient  After These Interventions, the Patient was reevaluated and was found stable for discharge.  Doubt systemic illness, or significant allergic reaction.  Possible impetigo versus nonspecific dermatitis.  CRITICAL CARE-no Performed by: Daleen Bo  Nursing Notes Reviewed/ Care Coordinated Applicable Imaging Reviewed Interpretation of Laboratory Data incorporated into ED treatment  The patient appears reasonably screened and/or stabilized for discharge and I doubt any other medical condition or other [facility] requiring further screening, evaluation, or treatment in the ED at this time prior to discharge.  Plan: Home Medications-continue usual including antibiotic ointment, until obtaining prescription; Home Treatments-daily cleansings; return here if the recommended treatment, does not improve the symptoms; Recommended follow up-PCP, as needed     Final Clinical Impression(s) / ED Diagnoses Final diagnoses:  Rash    Rx / DC Orders ED Discharge Orders         Ordered    mupirocin cream  (  BACTROBAN) 2 %  2 times daily     Discontinue  Reprint     10/21/19 2118           Daleen Bo, MD 10/21/19 2122

## 2019-11-07 ENCOUNTER — Encounter: Payer: Self-pay | Admitting: Nurse Practitioner

## 2019-11-07 ENCOUNTER — Ambulatory Visit (INDEPENDENT_AMBULATORY_CARE_PROVIDER_SITE_OTHER): Payer: Medicare Other | Admitting: Nurse Practitioner

## 2019-11-07 ENCOUNTER — Other Ambulatory Visit: Payer: Self-pay

## 2019-11-07 ENCOUNTER — Telehealth: Payer: Self-pay

## 2019-11-07 VITALS — BP 136/88 | HR 81 | Temp 97.5°F | Ht 64.0 in | Wt 184.4 lb

## 2019-11-07 DIAGNOSIS — K219 Gastro-esophageal reflux disease without esophagitis: Secondary | ICD-10-CM | POA: Diagnosis not present

## 2019-11-07 DIAGNOSIS — R1310 Dysphagia, unspecified: Secondary | ICD-10-CM

## 2019-11-07 DIAGNOSIS — K581 Irritable bowel syndrome with constipation: Secondary | ICD-10-CM

## 2019-11-07 NOTE — Telephone Encounter (Signed)
Pt came to the office today for an office visit with Walden Field NP. Pt left of office with a staff member from the [facility]. Staff members name is Airline pilot.  When Silva Bandy was trying to get the patient into their [place]i, using their step stool that they provided for the pt, the pt lost her footing and fell. Office was alerted to [place] RMA and myself went out and found the patient with her feet in the step stool and her body partially in the [place] and partially on the ground. We assisted getting the patient into her seat. Asked the patient several questions and she answered them correctly, she stated she was fine and not having any pain. Patient did not hit her head and did not loose consciousness.  No injuries noted on patient. The patient had a small bruise on each knee but the patient stated she fell at the [facility] last week and those bruises were from that fall and not today's fall.  Phyllis fastened the patients seatbelt and stated she would get someone to assist her with the patient when she gets to the [facility] and left with the patient.

## 2019-11-07 NOTE — Assessment & Plan Note (Signed)
GERD symptoms currently doing well on Protonix 40 mg daily. Recommend she continue her current medications. Let us know for any worsening symptoms, otherwise follow-up in 6 months.

## 2019-11-07 NOTE — Assessment & Plan Note (Signed)
History of IBS mixed type. She has been having some significant constipation. When I further discussed this with her, it seems she is having fecal urgency and soft stools. I reviewed her medications and she is on quite a bit of constipation medication including Amitiza 8 mcg twice daily, Colace daily, MiraLAX daily as needed, lactulose daily as needed but states she gets lactulose 2-3 times a day. At this point I will have them hold off on lactulose for now unless it is really needed for significant constipation. Follow-up in 6 months. Call for any worsening or changing symptoms and we can further address.

## 2019-11-07 NOTE — Progress Notes (Signed)
Referring Provider: Abran Richard, MD Primary Care Physician:  Abran Richard, MD Primary GI:  Dr. Gala Romney  Chief Complaint  Patient presents with  . Gastroesophageal Reflux    doing well, watches what she eats  . Constipation    still ongoing issue    HPI:   [name] is a 69 y.o. female who presents for follow-up on constipation and GERD.  The patient was last seen in our office 06/27/2019 for IBS and dysphagia.  Noted history of chronic GERD, drug-induced constipation, esophageal stenosis.  EGD September 2020 with reflux well controlled on PPI dysphagia improved not resolved after procedure.  No recent BPE or speech therapy evaluation.  Chronic issues with dentition.  SLP evaluation in December 2020 with mild pharyngeal aspiration with wet quality to improve after clearing of the throat recommended regular textures and thin liquids but no straws and standard aspiration reflux precautions.  Sit upright 90 degrees for 30 minutes after eating.  At her last visit noted intermittent diarrhea, still on stool softener and MiraLAX.  Has been refusing Amitiza due to diarrhea.  Right lower quadrant pain for the past couple weeks that is intermittent and triggered with standing or transferring to the bed or commode.  Not receiving over-the-counter pain medications although she does have hydrocodone for back pain.  Intermittent/rare solid food dysphagia with SLP precautions in place as recommended.  No other overt GI complaints.  Recommended change of Amitiza to 8 mcg twice a day, change MiraLAX and Colace to "as needed", stop MiraLAX and Colace when having diarrhea but readd them back in to regimen if constipation returns.  Recommended no need for repeat endoscopy.  Follow-up in 6 months.  Today she states she's doing ok overall. She is having more/worse constipation currently. States she is using colace and MiraLAX. Stools are mushy, and when questioned it doesn't seem like she is having a  lot of straining. Did have some fecal urgency. When discussed further she agrees she likely isn't aving constipation. May be having too much in the way of stool softeners/MirLAX. Occasional RUQ pain where her gallbladder used to be. Pain isn't bad and not very frequent; not particularly bothersome. Occasional nausea, but no vomiting. Appetite is good. GERD doing well, avoids triggers. Denies fever, chills, hematochezia, melena, unintentional weight loss. Denies URI or flu-like symptoms. Denies loss of sense of taste or smell. The patient has received COVID-19 vaccination(s). Denies chest pain, dyspnea, dizziness, lightheadedness, syncope, near syncope. Denies any other upper or lower GI symptoms.  Review of medications currently on Colace daily, lactulose daily prn, Amitiza 8 mcg bid, MiraLAX daily prn.  Past Medical History:  Diagnosis Date  . Allergic rhinitis    uses Nasonex daily as needed  . Allergy to angiotensin receptor blockers (ARB) 01/22/2014   Angioedema  . Anxiety    takes Ativan daily as needed  . Aspiration pneumonia ([place])   . Asthma    Albuterol inhaler and Neb daily as needed  . 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 [name], residual palpitations  . Bipolar 1 disorder ([place])   . Chronic back pain    reason unknown  . Constipation   . COPD (chronic obstructive pulmonary disease) ([place])   . DDD (degenerative disc disease), lumbar   . Depression    takes Zoloft daily as well as Cymbalta  . Diabetes mellitus    Type II  . Dysphagia   . Dysrhythmia  SVT ==ablation done by Dr. Rayann Heman 2007  . Esophageal reflux    takes Zantac daily  . Frequent headaches   . History of bronchitis Dec 2014  . Hypertension    takes Losartan daily  . Hypothyroidism    takes Synthroid daily  . Joint swelling    left ankle  . Low sodium syndrome   . Lumbar radiculopathy   . Seizures ([place]) 04-05-13   one time ran out of Primidone and had  stopped it cold [place]  . SVT (supraventricular tachycardia) ([place])   . Tremor, essential    takes Mysoline and Neurontin daily.  . Vocal cord dysfunction   . Weakness    in left arm    Past Surgical History:  Procedure Laterality Date  . ABDOMINAL HYSTERECTOMY    . ABLATION FOR AVNRT    . APPENDECTOMY    . BARTHOLIN GLAND CYST EXCISION     x 2  . BIOPSY  03/04/2015   Procedure: BIOPSY (Duodenal, Gastric);  Surgeon: Daneil Dolin, MD;  Location: [facility];  Service: Endoscopy;;  . BIOPSY  12/19/2018   Procedure: BIOPSY;  Surgeon: Daneil Dolin, MD;  [facility];  Service: Endoscopy;;  Gastric Biopsies    . BREAST SURGERY    . CESAREAN SECTION    . CHOLECYSTECTOMY    . COLONOSCOPY  01/16/2004   OYD:XAJOIN rectum/colon  . COLONOSCOPY WITH PROPOFOL N/A 03/04/2015   Dr.[name]- internal hemorrhoids, pancolonic diverticulosis, colonic polyp= tubular adenoma  . DEEP BRAIN STIMULATOR PLACEMENT    . DENTAL SURGERY    . ESOPHAGEAL DILATION N/A 03/04/2015   Procedure: ESOPHAGEAL DILATION WITH 54FR MALONEY DILATOR;  Surgeon: Daneil Dolin, MD;  Location: [facility];  Service: Endoscopy;  Laterality: N/A;  . ESOPHAGEAL DILATION  12/19/2018   Procedure: ESOPHAGEAL DILATION;  Surgeon: Daneil Dolin, MD;  [facility];  Service: Endoscopy;;  . ESOPHAGOGASTRODUODENOSCOPY (EGD) WITH ESOPHAGEAL DILATION  04/03/2002   OMV:[id number]'J ring, otherwise normal esophagus, status post dilation with 56 F/Normal stomach  . ESOPHAGOGASTRODUODENOSCOPY (EGD) WITH ESOPHAGEAL DILATION N/A 11/08/2012   Dr. [name]:schatzki's ring s/p dilation/hiatal hernia  . ESOPHAGOGASTRODUODENOSCOPY (EGD) WITH PROPOFOL N/A 03/04/2015   Dr.[name]- schatzki's ring, hiatal hernia, scattered erosions bx= chronic inactive gastritis  . ESOPHAGOGASTRODUODENOSCOPY (EGD) WITH PROPOFOL N/A 05/06/2017   Normal esophagus. Dilated. Small hiatal hernia. Normal duodenal bulb and second portion of duodenum.  .  ESOPHAGOGASTRODUODENOSCOPY (EGD) WITH PROPOFOL N/A 12/19/2018   Procedure: ESOPHAGOGASTRODUODENOSCOPY (EGD) WITH PROPOFOL;  Surgeon: Daneil Dolin, MD;  [facility];  Service: Endoscopy;  Laterality: N/A;  11:15am  . FINGER SURGERY    . HEMORRHOID BANDING  2017   Dr.[name]  . LEFT ANKLE LIGAMENT REPAIR     x 2  . LEFT ELBOW REPAIR    . MALONEY DILATION N/A 05/06/2017   Procedure: Venia Minks DILATION;  Surgeon: Daneil Dolin, MD;  [facility];  Service: Endoscopy;  Laterality: N/A;  . Venia Minks DILATION N/A 12/19/2018   Procedure: Venia Minks DILATION;  Surgeon: Daneil Dolin, MD;  [facility];  Service: Endoscopy;  Laterality: N/A;  . MAXIMUM ACCESS (MAS)POSTERIOR LUMBAR INTERBODY FUSION (PLIF) 1 LEVEL N/A 04/24/2014   Procedure: Lumbar four-five Maximum access Surgery posterior lumbar interbody fusion;  Surgeon: [name], MD;  Location: [facility];  Service: Neurosurgery;  Laterality: N/A;  Lumbar four-five Maximum access Surgery posterior lumbar interbody fusion  . MULTIPLE EXTRACTIONS WITH ALVEOLOPLASTY N/A 06/18/2017   Procedure: MULTIPLE  EXTRACTION;  Surgeon: Diona Browner, DDS;  Location: [facility];  Service: Oral Surgery;  Laterality: N/A;  . PARTIAL HYSTERECTOMY    . POLYPECTOMY  03/04/2015   Procedure: POLYPECTOMY (descending colon);  Surgeon: Daneil Dolin, MD;  Location[facility];  Service: Endoscopy;;  . PULSE GENERATOR IMPLANT Bilateral 12/15/2013   Procedure: BILATERAL PULSE GENERATOR IMPLANT;  Surgeon: [name], MD;  Location: [facility];  Service: Neurosurgery;  Laterality: Bilateral;  BILATERAL  . RIGHT BREAST CYST     benign  . SUBTHALAMIC STIMULATOR BATTERY REPLACEMENT Bilateral 06/24/2018   Procedure: Bilateral implantable pulse generator battery change;  Surgeon: [name], MD;  Location: [facility];  Service: Neurosurgery;  Laterality: Bilateral;  bilateral  . SUBTHALAMIC STIMULATOR INSERTION Bilateral 12/04/2013   Procedure: Bilateral  Deep brain stimulator placement;  Surgeon: [name], MD;  Location: [facility];  Service: Neurosurgery;  Laterality: Bilateral;  Bilateral Deep brain stimulator placement  . TONSILLECTOMY      Current Outpatient Medications  Medication Sig Dispense Refill  . acetaminophen (TYLENOL) 500 MG tablet Take 500 mg by mouth every 6 (six) hours as needed for mild pain, fever or headache.     . albuterol (PROAIR HFA) 108 (90 Base) MCG/ACT inhaler Inhale 2 puffs every 6 hours for asthma - rescue inhaler 1 Inhaler 12  . alum & mag hydroxide-simeth (MAALOX/MYLANTA) 200-200-20 MG/5ML suspension Take 30 mLs by mouth as needed for indigestion or heartburn. Max 4 doses per 24 hours    . Ascorbic Acid (VITAMIN C WITH ROSE HIPS) 500 MG tablet Take 500 mg by mouth in the morning.    Marland Kitchen aspirin 81 MG chewable tablet Chew 81 mg by mouth in the morning.     . benzonatate (TESSALON PERLES) 100 MG capsule Take 1 capsule (100 mg total) by mouth 3 (three) times daily as needed for cough. 30 capsule 0  . Carboxymethylcellulose Sod PF (REFRESH PLUS) 0.5 % SOLN Place 1 drop into both eyes 2 (two) times daily.     . Cholecalciferol (VITAMIN D-3) 125 MCG (5000 UT) TABS Take 5,000 Units by mouth in the morning.     . divalproex (DEPAKOTE) 500 MG DR tablet Take 500 mg by mouth 3 (three) times daily.     Marland Kitchen docusate sodium (COLACE) 100 MG capsule Take 100 mg by mouth in the morning.     . fluticasone furoate-vilanterol (BREO ELLIPTA) 100-25 MCG/INH AEPB Inhale 1 puff into the lungs daily. 1 each 11  . gabapentin (NEURONTIN) 300 MG capsule Take 300 mg by mouth See admin instructions. Take 300 mg by mouth in the morning and 600 mg at bedtime    . glucose 4 GM chewable tablet Chew 5 tablets by mouth as needed for low blood sugar. Recheck FSBS in 15 minutes and repeat if <40    . HYDROcodone-acetaminophen (NORCO/VICODIN) 5-325 MG tablet Take 1 tablet by mouth 3 (three) times daily as needed for moderate pain (Please do not give if  sedated or lethargic.). 30 tablet 0  . hydrocortisone cream 1 % Apply 1 application topically 2 (two) times daily as needed ((hemorrhoidal flare)).     Marland Kitchen insulin glargine (LANTUS) 100 UNIT/ML injection Inject 15 Units into the skin at bedtime. *Hold if blood glucose is less than 160*    . ipratropium (ATROVENT) 0.03 % nasal spray Place 2 sprays into both nostrils 3 (three) times daily. Before meals    . lactulose (CHRONULAC) 10 GM/15ML solution Take 15 g by mouth daily  as needed for mild constipation.     Marland Kitchen levothyroxine (SYNTHROID, LEVOTHROID) 75 MCG tablet TAKE (1) TABLET BY MOUTH ONCE DAILY BEFORE BREAKFAST. 30 tablet 5  . loperamide (IMODIUM A-D) 2 MG tablet Take 2 mg by mouth 3 (three) times daily as needed for diarrhea or loose stools.     . lubiprostone (AMITIZA) 8 MCG capsule Take 1 capsule (8 mcg total) by mouth 2 (two) times daily with a meal. 180 capsule 3  . magnesium hydroxide (MILK OF MAGNESIA) 400 MG/5ML suspension Take 30 mLs by mouth at bedtime as needed for mild constipation.     . Menthol, Topical Analgesic, (BIOFREEZE) 4 % GEL Apply 1 application topically 2 (two) times daily. Apply to lower back and base of right thumb twice daily.    . metoprolol succinate (TOPROL-XL) 50 MG 24 hr tablet Take 50 mg by mouth in the morning. Take with or immediately following a meal.     . Multiple Vitamin (MULTIVITAMIN) tablet Take 1 tablet by mouth daily.    . mupirocin cream (BACTROBAN) 2 % Apply 1 application topically 2 (two) times daily. Apply to rash on abdomen 15 g 0  . mupirocin ointment (BACTROBAN) 2 % Apply 1 application topically daily. Apply to left 5th digit nailbed every day until healed.    Marland Kitchen NOVOLOG FLEXPEN 100 UNIT/ML FlexPen 0-9 Units, Subcutaneous, 3 times daily with meals CBG < 70: Implement Hypoglycemia meaures CBG 70 - 120: 0 units CBG 121 - 150: 1 unit CBG 151 - 200: 2 units CBG 201 - 250: 3 units CBG 251 - 300: 5 units CBG 301 - 350: 7 units CBG 351 - 400: 9  units CBG > 400: call MD 15 mL 11  . omega-3 acid ethyl esters (LOVAZA) 1 g capsule Take 1 g by mouth daily.    . pantoprazole (PROTONIX) 40 MG tablet Take 40 mg by mouth daily before breakfast.     . PARoxetine (PAXIL) 10 MG tablet Take 10 mg by mouth daily.     . polyethylene glycol (MIRALAX / GLYCOLAX) packet Take 17 g by mouth daily as needed for moderate constipation. 14 each 0  . potassium chloride SA (K-DUR,KLOR-CON) 20 MEQ tablet Take 20 mEq by mouth daily.    . primidone (MYSOLINE) 50 MG tablet Take 50 mg by mouth in the morning.     Marland Kitchen QUEtiapine (SEROQUEL) 100 MG tablet Take 100 mg by mouth at bedtime.    . simvastatin (ZOCOR) 5 MG tablet Take 5 mg by mouth at bedtime.     . sitaGLIPtin (JANUVIA) 100 MG tablet Take 100 mg by mouth daily.    . sodium chloride (OCEAN) 0.65 % nasal spray Place 2 sprays into the nose as needed (Rhinitis). Both nostrils    . sodium chloride 1 g tablet Take 1 g by mouth 2 (two) times daily.    [place] Tosylate 40 MG CAPS Take 40 mg by mouth daily.    Marland Kitchen zinc gluconate 50 MG tablet Take 50 mg by mouth daily.     No current facility-administered medications for this visit.    Allergies as of 11/07/2019 - Review Complete 11/07/2019  Allergen Reaction Noted  . Invokana [canagliflozin] Other (See Comments) 12/05/2014  . Losartan Other (See Comments) 01/22/2014  . Metformin and related Diarrhea 01/18/2013  . Ace inhibitors  09/19/2019  . Latex Other (See Comments) 07/02/2014  . Other Other (See Comments) 07/02/2014  . Oxycodone-acetaminophen Itching and Other (See Comments) 05/11/2011  .  Mucinex [guaifenesin er] Other (See Comments)     Family History  Problem Relation Age of Onset  . Lung cancer Father        DIED AGE 72 LUNG CA  . Alcohol abuse Father   . Anxiety disorder Father   . Depression Father   . COPD Mother        DIED AGE 68,MRSA,COPD,PNEUMONIA  . Pneumonia Mother        DIED AGE 68,MRSA,COPD,PNEUMONIA  . Supraventricular  tachycardia Mother   . Anxiety disorder Mother   . Depression Mother   . Alcohol abuse Mother   . Heart disease Sister        DIED AGE 70, SMOKER,?HEART  . COPD Brother        AGE 38  . Colon cancer Neg Hx     Social History   Socioeconomic History  . Marital status: Legally Separated    Spouse name: Not on file  . Number of children: Not on file  . Years of education: Not on file  . Highest education level: Bachelor's degree (e.g., BA, AB, BS)  Occupational History  . Occupation: retired  Tobacco Use  . Smoking status: Former Smoker    Packs/day: 0.30    Years: 10.00    Pack years: 3.00    Types: Cigarettes    Quit date: 04/23/1993    Years since quitting: 26.5  . Smokeless tobacco: Never Used  . Tobacco comment: quit smoking 25+yrs ago  Vaping Use  . Vaping Use: Never used  Substance and Sexual Activity  . Alcohol use: No    Alcohol/week: 0.0 standard drinks  . Drug use: No  . Sexual activity: Not Currently  Other Topics Concern  . Not on file  Social History Narrative   Married, 1 daughte, living.  Lives with husband in [place], [place].   1 child died brain tumor at age 71.   Two grandchildren.   Works at [employer], patient currently lost her job.   Retired 2011.   No tobacco.   Alcohol: none in 30 yrs.  Distant history of heavy use.   No drug use.   Social Determinants of Health   Financial Resource Strain:   . Difficulty of Paying Living Expenses:   Food Insecurity:   . Worried About Charity fundraiser in the Last Year:   . Arboriculturist in the Last Year:   Transportation Needs:   . Film/video editor (Medical):   Marland Kitchen Lack of Transportation (Non-Medical):   Physical Activity:   . Days of Exercise per Week:   . Minutes of Exercise per Session:   Stress:   . Feeling of Stress :   Social Connections:   . Frequency of Communication with Friends and Family:   . Frequency of Social Gatherings with Friends and Family:   . Attends Religious  Services:   . Active Member of Clubs or Organizations:   . Attends Archivist Meetings:   Marland Kitchen Marital Status:     Subjective: Review of Systems  Constitutional: Negative for chills, fever, malaise/fatigue and weight loss.  HENT: Negative for congestion and sore throat.   Respiratory: Negative for cough and shortness of breath.   Cardiovascular: Negative for chest pain and palpitations.  Gastrointestinal: Positive for abdominal pain (itnermittent/rare, not bothersome). Negative for blood in stool, diarrhea, melena, nausea and vomiting.       Notes soft stools  Musculoskeletal: Negative for joint pain and myalgias.  Skin:  Negative for rash.  Neurological: Negative for dizziness and weakness.  Endo/Heme/Allergies: Does not bruise/bleed easily.  Psychiatric/Behavioral: Negative for depression. The patient is not nervous/anxious.   All other systems reviewed and are negative.    Objective: BP 136/88   Pulse 81   Temp (!) 97.5 F (36.4 C)   Ht 5\' 4"  (1.626 m)   Wt 184 lb 6.4 oz (83.6 kg)   BMI 31.65 kg/m  Physical Exam Vitals and nursing note reviewed.  Constitutional:      General: She is not in acute distress.    Appearance: Normal appearance. She is well-developed. She is obese. She is not ill-appearing, toxic-appearing or diaphoretic.  HENT:     Head: Normocephalic and atraumatic.     Nose: No congestion or rhinorrhea.  Eyes:     General: No scleral icterus. Cardiovascular:     Rate and Rhythm: Normal rate and regular rhythm.     Heart sounds: Normal heart sounds.  Pulmonary:     Effort: Pulmonary effort is normal. No respiratory distress.     Breath sounds: Normal breath sounds.  Abdominal:     General: Bowel sounds are normal.     Palpations: Abdomen is soft. There is no hepatomegaly, splenomegaly or mass.     Tenderness: There is no abdominal tenderness. There is no guarding or rebound.     Hernia: No hernia is present.  Skin:    General: Skin is warm  and dry.     Coloration: Skin is not jaundiced.     Findings: No rash.  Neurological:     General: No focal deficit present.     Mental Status: She is alert and oriented to person, place, and time.  Psychiatric:        Attention and Perception: Attention normal.        Mood and Affect: Mood normal.        Speech: Speech normal.        Behavior: Behavior normal.        Thought Content: Thought content normal.        Cognition and Memory: Cognition and memory normal.       11/07/2019 11:57 AM   Disclaimer: This note was dictated with voice recognition software. Similar sounding words can inadvertently be transcribed and may not be corrected upon review.

## 2019-11-07 NOTE — Assessment & Plan Note (Signed)
Overall dysphagia is doing well. She does have difficulty chewing due to poor dentition, as noted per HPI. However, she states that they are continuing the SLP precautions recommended by speech therapy. I again discussed taking her time to chew, chewing adequately, prefer soft foods. Take pills with copious amounts of fluids. Call for any worsening or severe symptoms. Follow-up in 6 months.

## 2019-11-07 NOTE — Telephone Encounter (Signed)
Reviewed

## 2019-11-07 NOTE — Patient Instructions (Signed)
Your health issues we discussed today were:   GERD (heartburn/reflux) with dysphagia (swallowing difficulties): 1. Continue to take Protonix daily as you have been for your heartburn 2. Take your time eating, chew your food thoroughly, take small bites 3. Remain sitting upright for at least 30 minutes after eating 4. Keep fluids on hand while eating and take sips of water/beverage as you eat 5. Take your pills with significant amounts of fluid to help ensure the pills go down 6. Notify us of any other symptoms or worsening symptoms  Irritable bowel syndrome (IBS) with constipation/soft stools: 1. As we discussed, continue Amitiza 8 mcg twice daily, MiraLAX daily as needed, Colace daily as needed 2. Hold lactulose for now unless you really needed for significant constipation 3. Call us for any worsening or severe symptoms  Overall I recommend:  1. Continue your other current medications 2. Return for follow-up in 6 months 3. Call us if you have any questions or concerns.   ---------------------------------------------------------------  I am glad you have gotten your COVID-19 vaccination!  Even though you are fully vaccinated you should continue to follow CDC and state/local guidelines.  ---------------------------------------------------------------   At [facility] we value your feedback. You may receive a survey about your visit today. Please share your experience as we strive to create trusting relationships with our patients to provide genuine, compassionate, quality care.  We appreciate your understanding and patience as we review any laboratory studies, imaging, and other diagnostic tests that are ordered as we care for you. Our office policy is 5 business days for review of these results, and any emergent or urgent results are addressed in a timely manner for your best interest. If you do not hear from our office in 1 week, please contact us.   We also encourage  the use of MyChart, which contains your medical information for your review as well. If you are not enrolled in this feature, an access code is on this after visit summary for your convenience. Thank you for allowing [place] to be involved in your care.  It was great to see you today!  I hope you have a great Summer!!

## 2019-11-09 ENCOUNTER — Other Ambulatory Visit: Payer: Self-pay | Admitting: Internal Medicine

## 2019-11-13 NOTE — Telephone Encounter (Signed)
Noted  

## 2019-11-14 ENCOUNTER — Other Ambulatory Visit: Payer: Self-pay | Admitting: Internal Medicine

## 2019-11-19 ENCOUNTER — Emergency Department (HOSPITAL_COMMUNITY): Payer: Medicare Other

## 2019-11-19 ENCOUNTER — Other Ambulatory Visit: Payer: Self-pay

## 2019-11-19 ENCOUNTER — Emergency Department (HOSPITAL_COMMUNITY)
Admission: EM | Admit: 2019-11-19 | Discharge: 2019-11-19 | Disposition: A | Discharge status: Home / Self Care | Payer: Medicare Other | Attending: Emergency Medicine | Admitting: Emergency Medicine

## 2019-11-19 ENCOUNTER — Encounter (HOSPITAL_COMMUNITY): Payer: Self-pay | Admitting: Emergency Medicine

## 2019-11-19 DIAGNOSIS — W19XXXA Unspecified fall, initial encounter: Secondary | ICD-10-CM | POA: Diagnosis not present

## 2019-11-19 DIAGNOSIS — Y9289 Other specified places as the place of occurrence of the external cause: Secondary | ICD-10-CM | POA: Insufficient documentation

## 2019-11-19 DIAGNOSIS — T07XXXA Unspecified multiple injuries, initial encounter: Secondary | ICD-10-CM | POA: Insufficient documentation

## 2019-11-19 DIAGNOSIS — E1169 Type 2 diabetes mellitus with other specified complication: Secondary | ICD-10-CM | POA: Diagnosis not present

## 2019-11-19 DIAGNOSIS — Z794 Long term (current) use of insulin: Secondary | ICD-10-CM | POA: Diagnosis not present

## 2019-11-19 DIAGNOSIS — Y998 Other external cause status: Secondary | ICD-10-CM | POA: Diagnosis not present

## 2019-11-19 DIAGNOSIS — Z7951 Long term (current) use of inhaled steroids: Secondary | ICD-10-CM | POA: Insufficient documentation

## 2019-11-19 DIAGNOSIS — Z91040 Latex allergy status: Secondary | ICD-10-CM | POA: Insufficient documentation

## 2019-11-19 DIAGNOSIS — E039 Hypothyroidism, unspecified: Secondary | ICD-10-CM | POA: Diagnosis not present

## 2019-11-19 DIAGNOSIS — J449 Chronic obstructive pulmonary disease, unspecified: Secondary | ICD-10-CM | POA: Insufficient documentation

## 2019-11-19 DIAGNOSIS — Y9389 Activity, other specified: Secondary | ICD-10-CM | POA: Diagnosis not present

## 2019-11-19 DIAGNOSIS — I10 Essential (primary) hypertension: Secondary | ICD-10-CM | POA: Diagnosis not present

## 2019-11-19 DIAGNOSIS — Z87891 Personal history of nicotine dependence: Secondary | ICD-10-CM | POA: Insufficient documentation

## 2019-11-19 DIAGNOSIS — Z79899 Other long term (current) drug therapy: Secondary | ICD-10-CM | POA: Insufficient documentation

## 2019-11-19 DIAGNOSIS — R0789 Other chest pain: Secondary | ICD-10-CM | POA: Diagnosis present

## 2019-11-19 LAB — CBG MONITORING, ED: Glucose-Capillary: 105 mg/dL — ABNORMAL HIGH (ref 70–99)

## 2019-11-19 NOTE — Discharge Instructions (Signed)
The x-rays did not show any serious injuries.  Use Tylenol if needed for pain.  See your doctor as needed for problems.

## 2019-11-19 NOTE — ED Triage Notes (Signed)
Pt reports falling on Thursday and she was gotten up by staff.  C/o right hip pain and rates the pain 8/10.

## 2019-11-19 NOTE — ED Notes (Signed)
Pt placed on purewick 

## 2019-11-19 NOTE — ED Triage Notes (Signed)
Spoke to [place], [place] at facility, tech reports pt only c/o to her today,  No pain medication given, pt refused tylenol.  Facility denies injury or fall.

## 2019-11-19 NOTE — ED Provider Notes (Signed)
[facility] EMERGENCY DEPARTMENT Provider Note   CSN: [id number] Arrival date & time: 11/19/19  1453     History No chief complaint on file.   [name] is a 69 y.o. female.  HPI She is here for evaluation of injuries from fall 2 weeks ago.  She localizes the pain to the right lower chest wall in the right pelvic area.  She denies head injury, neck pain or back pain.  No recurrent falls.  She lives at a nursing care facility.  She denies recent fever, chills, cough, nausea or vomiting.  There are no other known modifying factors.  She saw her GI doctor 2 weeks ago for dysphagia, and GERD.  There are no other known modifying factors.    Past Medical History:  Diagnosis Date  . Allergic rhinitis    uses Nasonex daily as needed  . Allergy to angiotensin receptor blockers (ARB) 01/22/2014   Angioedema  . Anxiety    takes Ativan daily as needed  . Aspiration pneumonia ([place])   . Asthma    Albuterol inhaler and Neb daily as needed  . 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 [name], residual palpitations  . Bipolar 1 disorder ([place])   . Chronic back pain    reason unknown  . Constipation   . COPD (chronic obstructive pulmonary disease) ([place])   . DDD (degenerative disc disease), lumbar   . Depression    takes Zoloft daily as well as Cymbalta  . Diabetes mellitus    Type II  . Dysphagia   . Dysrhythmia    SVT ==ablation done by Dr. Rayann Heman 2007  . Esophageal reflux    takes Zantac daily  . Frequent headaches   . History of bronchitis Dec 2014  . Hypertension    takes Losartan daily  . Hypothyroidism    takes Synthroid daily  . Joint swelling    left ankle  . Low sodium syndrome   . Lumbar radiculopathy   . Seizures ([place]) 04-05-13   one time ran out of Primidone and had stopped it cold [place]  . SVT (supraventricular tachycardia) ([place])   . Tremor, essential    takes Mysoline and Neurontin daily.  . Vocal cord  dysfunction   . Weakness    in left arm    Patient Active Problem List   Diagnosis Date Noted  . Macrocytic anemia 09/19/2019  . Thrombocytopenia ([place]) 09/19/2019  . Chronic back pain   . Altered mental status 09/11/2019  . Opioid overdose ([place]) 09/11/2019  . Hypotension 09/11/2019  . Acute hypoxemic respiratory failure due to COVID-19 ([place]) 09/11/2019  . Uncomplicated asthma 17/61/6073  . Skin erythema 09/28/2018  . Umbilical pain 71/09/2692  . Constipation 03/18/2017  . CAP (community acquired pneumonia) 04/21/2016  . History of colonic polyps   . Diverticulosis of colon without hemorrhage   . Other hemorrhoids   . [name]'s ring   . Mucosal abnormality of stomach   . Dysphagia   . Encounter for screening colonoscopy 02/21/2015  . Upper airway cough syndrome 01/18/2015  . OAB (overactive bladder) 12/30/2014  . Incontinence in female 12/25/2014  . MDD (major depressive disorder), recurrent severe, without psychosis ([place]) 12/14/2014  . Thrush of mouth and esophagus ([place]) 11/23/2014  . Depression with somatization 11/05/2014  . Type II diabetes mellitus with manifestations ([place]) 10/23/2014  . Visit for screening mammogram 08/21/2014  . Hypokalemia 05/30/2014  . Tinea corporis 05/30/2014  .  Lumbar scoliosis 04/24/2014  . Obstructive sleep apnea 04/23/2014  . Moderate persistent asthma with acute bronchitis and acute exacerbation 03/02/2014  . Allergy to angiotensin receptor blockers (ARB) 01/22/2014  . S/P deep brain stimulator placement 01/10/2014  . Greater trochanteric bursitis of left hip 11/07/2013  . Spinal stenosis of lumbar region 10/30/2013  . Right thyroid nodule 10/12/2013  . CMC arthritis, thumb, degenerative 06/15/2013  . GERD (gastroesophageal reflux disease) 11/03/2012  . IBS (irritable bowel syndrome) 11/03/2012  . Esophageal stenosis 11/01/2012  . Asthma, moderate persistent, poorly-controlled 09/01/2012  . Hypothyroidism 12/22/2011  . Hyperlipidemia  with target LDL less than 100 06/22/2008  . Essential hypertension 09/04/2007  . Tremor, essential 04/19/2007  . Seasonal and perennial allergic rhinitis 04/19/2007    Past Surgical History:  Procedure Laterality Date  . ABDOMINAL HYSTERECTOMY    . ABLATION FOR AVNRT    . APPENDECTOMY    . BARTHOLIN GLAND CYST EXCISION     x 2  . BIOPSY  03/04/2015   Procedure: BIOPSY (Duodenal, Gastric);  Surgeon: Daneil Dolin, MD;  Location: [facility];  Service: Endoscopy;;  . BIOPSY  12/19/2018   Procedure: BIOPSY;  Surgeon: Daneil Dolin, MD;  Location: [facility];  Service: Endoscopy;;  Gastric Biopsies    . BREAST SURGERY    . CESAREAN SECTION    . CHOLECYSTECTOMY    . COLONOSCOPY  01/16/2004   URK:YHCWCB rectum/colon  . COLONOSCOPY WITH PROPOFOL N/A 03/04/2015   Dr.[name]- internal hemorrhoids, pancolonic diverticulosis, colonic polyp= tubular adenoma  . DEEP BRAIN STIMULATOR PLACEMENT    . DENTAL SURGERY    . ESOPHAGEAL DILATION N/A 03/04/2015   Procedure: ESOPHAGEAL DILATION WITH 54FR MALONEY DILATOR;  Surgeon: Daneil Dolin, MD;  Location: [facility];  Service: Endoscopy;  Laterality: N/A;  . ESOPHAGEAL DILATION  12/19/2018   Procedure: ESOPHAGEAL DILATION;  Surgeon: Daneil Dolin, MD;  [facility];  Service: Endoscopy;;  . ESOPHAGOGASTRODUODENOSCOPY (EGD) WITH ESOPHAGEAL DILATION  04/03/2002   JSE:GBTDVVOH'Y ring, otherwise normal esophagus, status post dilation with 56 F/Normal stomach  . ESOPHAGOGASTRODUODENOSCOPY (EGD) WITH ESOPHAGEAL DILATION N/A 11/08/2012   Dr. [name]:schatzki's ring s/p dilation/hiatal hernia  . ESOPHAGOGASTRODUODENOSCOPY (EGD) WITH PROPOFOL N/A 03/04/2015   Dr.[name]- schatzki's ring, hiatal hernia, scattered erosions bx= chronic inactive gastritis  . ESOPHAGOGASTRODUODENOSCOPY (EGD) WITH PROPOFOL N/A 05/06/2017   Normal esophagus. Dilated. Small hiatal hernia. Normal duodenal bulb and second portion of duodenum.  . ESOPHAGOGASTRODUODENOSCOPY (EGD)  WITH PROPOFOL N/A 12/19/2018   Procedure: ESOPHAGOGASTRODUODENOSCOPY (EGD) WITH PROPOFOL;  Surgeon: Daneil Dolin, MD;  [facility];  Service: Endoscopy;  Laterality: N/A;  11:15am  . FINGER SURGERY    . HEMORRHOID BANDING  2017   Dr.[name]  . LEFT ANKLE LIGAMENT REPAIR     x 2  . LEFT ELBOW REPAIR    . MALONEY DILATION N/A 05/06/2017   Procedure: Venia Minks DILATION;  Surgeon: Daneil Dolin, MD;  [facility];  Service: Endoscopy;  Laterality: N/A;  . Venia Minks DILATION N/A 12/19/2018   Procedure: Venia Minks DILATION;  Surgeon: Daneil Dolin, MD;  [facility];  Service: Endoscopy;  Laterality: N/A;  . MAXIMUM ACCESS (MAS)POSTERIOR LUMBAR INTERBODY FUSION (PLIF) 1 LEVEL N/A 04/24/2014   Procedure: Lumbar four-five Maximum access Surgery posterior lumbar interbody fusion;  Surgeon: [name], MD;  Location: [place] [facility];  Service: Neurosurgery;  Laterality: N/A;  Lumbar four-five Maximum access Surgery posterior lumbar interbody fusion  . MULTIPLE EXTRACTIONS WITH ALVEOLOPLASTY  N/A 06/18/2017   Procedure: MULTIPLE EXTRACTION;  Surgeon: Diona Browner, DDS;  Location: [place];  Service: Oral Surgery;  Laterality: N/A;  . PARTIAL HYSTERECTOMY    . POLYPECTOMY  03/04/2015   Procedure: POLYPECTOMY (descending colon);  Surgeon: Daneil Dolin, MD;  Location: [facility];  Service: Endoscopy;;  . PULSE GENERATOR IMPLANT Bilateral 12/15/2013   Procedure: BILATERAL PULSE GENERATOR IMPLANT;  Surgeon: [name], MD;  Location: [place] [facility];  Service: Neurosurgery;  Laterality: Bilateral;  BILATERAL  . RIGHT BREAST CYST     benign  . SUBTHALAMIC STIMULATOR BATTERY REPLACEMENT Bilateral 06/24/2018   Procedure: Bilateral implantable pulse generator battery change;  Surgeon: [name], MD;  Location: [place];  Service: Neurosurgery;  Laterality: Bilateral;  bilateral  . SUBTHALAMIC STIMULATOR INSERTION Bilateral 12/04/2013   Procedure: Bilateral Deep brain stimulator placement;   Surgeon: [name], MD;  Location: [place] [facility] OR[facility];  Service: Neurosurgery;  Laterality: Bilateral;  Bilateral Deep brain stimulator placement  . TONSILLECTOMY       OB History    Gravida  2   Para  2   Term  2   Preterm      AB      Living  1     SAB      TAB      Ectopic      Multiple      Live Births              Family History  Problem Relation Age of Onset  . Lung cancer Father        DIED AGE 48 LUNG CA  . Alcohol abuse Father   . Anxiety disorder Father   . Depression Father   . COPD Mother        DIED AGE 73,MRSA,COPD,PNEUMONIA  . Pneumonia Mother        DIED AGE 73,MRSA,COPD,PNEUMONIA  . Supraventricular tachycardia Mother   . Anxiety disorder Mother   . Depression Mother   . Alcohol abuse Mother   . Heart disease Sister        DIED AGE 98, SMOKER,?HEART  . COPD Brother        AGE 71  . Colon cancer Neg Hx     Social History   Tobacco Use  . Smoking status: Former Smoker    Packs/day: 0.30    Years: 10.00    Pack years: 3.00    Types: Cigarettes    Quit date: 04/23/1993    Years since quitting: 26.5  . Smokeless tobacco: Never Used  . Tobacco comment: quit smoking 25+yrs ago  Vaping Use  . Vaping Use: Never used  Substance Use Topics  . Alcohol use: No    Alcohol/week: 0.0 standard drinks  . Drug use: No    Home Medications Prior to Admission medications   Medication Sig Start Date End Date Taking? Authorizing Provider  acetaminophen (TYLENOL) 500 MG tablet Take 500 mg by mouth every 6 (six) hours as needed for mild pain, fever or headache.     [provider]  albuterol (PROAIR HFA) 108 (90 Base) MCG/ACT inhaler Inhale 2 puffs every 6 hours for asthma - rescue inhaler 05/28/17   Baird Lyons D, MD  alum & mag hydroxide-simeth (MAALOX/MYLANTA) 200-200-20 MG/5ML suspension Take 30 mLs by mouth as needed for indigestion or heartburn. Max 4 doses per 24 hours    [provider]  Ascorbic Acid (VITAMIN C WITH ROSE  HIPS) 500 MG tablet Take 500 mg  by mouth in the morning.    [provider]  aspirin 81 MG chewable tablet Chew 81 mg by mouth in the morning.     [provider]  benzonatate (TESSALON PERLES) 100 MG capsule Take 1 capsule (100 mg total) by mouth 3 (three) times daily as needed for cough. 09/29/19   Medina-Vargas, Monina C, NP  Carboxymethylcellulose Sod PF (REFRESH PLUS) 0.5 % SOLN Place 1 drop into both eyes 2 (two) times daily.     [provider]  Cholecalciferol (VITAMIN D-3) 125 MCG (5000 UT) TABS Take 5,000 Units by mouth in the morning.     [provider]  divalproex (DEPAKOTE) 500 MG DR tablet Take 500 mg by mouth 3 (three) times daily.     [provider]  docusate sodium (COLACE) 100 MG capsule Take 100 mg by mouth in the morning.     [provider]  fluticasone furoate-vilanterol (BREO ELLIPTA) 100-25 MCG/INH AEPB Inhale 1 puff into the lungs daily. 12/17/16   Baird Lyons D, MD  gabapentin (NEURONTIN) 300 MG capsule Take 300 mg by mouth See admin instructions. Take 300 mg by mouth in the morning and 600 mg at bedtime    [provider]  glucose 4 GM chewable tablet Chew 5 tablets by mouth as needed for low blood sugar. Recheck FSBS in 15 minutes and repeat if <40    [provider]  HYDROcodone-acetaminophen (NORCO/VICODIN) 5-325 MG tablet Take 1 tablet by mouth 3 (three) times daily as needed for moderate pain (Please do not give if sedated or lethargic.). 09/19/19   Medina-Vargas, Monina C, NP  hydrocortisone cream 1 % Apply 1 application topically 2 (two) times daily as needed ((hemorrhoidal flare)).     [provider]  insulin glargine (LANTUS) 100 UNIT/ML injection Inject 15 Units into the skin at bedtime. *Hold if blood glucose is less than 160*    [provider]  ipratropium (ATROVENT) 0.03 % nasal spray Place 2 sprays into both nostrils 3 (three) times daily. Before meals    [provider]  lactulose (CHRONULAC) 10 GM/15ML solution Take 15 g by mouth daily as needed for mild constipation.     [provider]  levothyroxine (SYNTHROID, LEVOTHROID) 75 MCG tablet TAKE (1) TABLET BY MOUTH ONCE DAILY BEFORE BREAKFAST. 01/12/15   Janith Lima, MD  loperamide (IMODIUM A-D) 2 MG tablet Take 2 mg by mouth 3 (three) times daily as needed for diarrhea or loose stools.     [provider]  lubiprostone (AMITIZA) 8 MCG capsule Take 1 capsule (8 mcg total) by mouth 2 (two) times daily with a meal. 06/27/19   Carlis Stable, NP  magnesium hydroxide (MILK OF MAGNESIA) 400 MG/5ML suspension Take 30 mLs by mouth at bedtime as needed for mild constipation.     [provider]  Menthol, Topical Analgesic, (BIOFREEZE) 4 % GEL Apply 1 application topically 2 (two) times daily. Apply to lower back and base of right thumb twice daily.    [provider]  metoprolol succinate (TOPROL-XL) 50 MG 24 hr tablet Take 50 mg by mouth in the morning. Take with or immediately following a meal.     [provider]  Multiple Vitamin (MULTIVITAMIN) tablet Take 1 tablet by mouth daily.    [provider]  mupirocin cream (BACTROBAN) 2 % Apply 1 application topically 2 (two) times daily. Apply to rash on abdomen 10/21/19   Daleen Bo, MD  mupirocin ointment (  BACTROBAN) 2 % Apply 1 application topically daily. Apply to left 5th digit nailbed every day until healed. 09/08/19   [provider]  NOVOLOG FLEXPEN 100 UNIT/ML FlexPen 0-9 Units, Subcutaneous, 3 times daily with meals CBG < 70: Implement Hypoglycemia meaures CBG 70 - 120: 0 units CBG 121 - 150: 1 unit CBG 151 - 200: 2 units CBG 201 - 250: 3 units CBG 251 - 300: 5 units CBG 301 - 350: 7 units CBG 351 - 400: 9 units CBG > 400: call MD 09/15/19   Jonetta Osgood, MD  omega-3 acid ethyl esters (LOVAZA) 1 g capsule Take 1 g by mouth daily.    [provider]  pantoprazole  (PROTONIX) 40 MG tablet Take 40 mg by mouth daily before breakfast.     [provider]  PARoxetine (PAXIL) 10 MG tablet Take 10 mg by mouth daily.  10/07/18   [provider]  polyethylene glycol (MIRALAX / GLYCOLAX) packet Take 17 g by mouth daily as needed for moderate constipation. 10/01/17   Orpah Greek, MD  potassium chloride SA (K-DUR,KLOR-CON) 20 MEQ tablet Take 20 mEq by mouth daily.    [provider]  primidone (MYSOLINE) 50 MG tablet Take 50 mg by mouth in the morning.     [provider]  QUEtiapine (SEROQUEL) 100 MG tablet Take 100 mg by mouth at bedtime.    [provider]  simvastatin (ZOCOR) 5 MG tablet Take 5 mg by mouth at bedtime.  08/21/17   [provider]  sitaGLIPtin (JANUVIA) 100 MG tablet Take 100 mg by mouth daily.    [provider]  sodium chloride (OCEAN) 0.65 % nasal spray Place 2 sprays into the nose as needed (Rhinitis). Both nostrils    [provider]  sodium chloride 1 g tablet Take 1 g by mouth 2 (two) times daily.    [provider]  Valbenazine Tosylate 40 MG CAPS Take 40 mg by mouth daily.    [provider]  zinc gluconate 50 MG tablet Take 50 mg by mouth daily.    [provider]    Allergies    Invokana [canagliflozin], Losartan, Metformin and related, Ace inhibitors, Latex, Other, Oxycodone-acetaminophen, and Mucinex [guaifenesin er]  Review of Systems   Review of Systems  All other systems reviewed and are negative.   Physical Exam Updated Vital Signs BP 131/73   Pulse 79   Temp 99 F (37.2 C) (Oral)   Resp 18   SpO2 100%   Physical Exam Vitals and nursing note reviewed.  Constitutional:      General: She is not in acute distress.    Appearance: She is well-developed. She is not ill-appearing.  HENT:     Head: Normocephalic and atraumatic.     Right Ear: External ear normal.     Left Ear: External ear normal.  Eyes:      Conjunctiva/sclera: Conjunctivae normal.     Pupils: Pupils are equal, round, and reactive to light.  Neck:     Trachea: Phonation normal.  Cardiovascular:     Rate and Rhythm: Normal rate and regular rhythm.     Heart sounds: Normal heart sounds.  Pulmonary:     Effort: Pulmonary effort is normal.     Breath sounds: Normal breath sounds.  Chest:     Chest wall: Tenderness (Right lower, diffuse, without crepitation or deformity) present.  Abdominal:     General: There is no distension.  Palpations: Abdomen is soft.     Tenderness: There is no abdominal tenderness.  Musculoskeletal:        General: Normal range of motion.     Cervical back: Normal range of motion and neck supple.     Comments: Normal active and passive range of motion of both arms and legs.  Mildly tender in the right pelvic wing, without crepitation or overlying skin changes.  Skin:    General: Skin is warm and dry.  Neurological:     Mental Status: She is alert and oriented to person, place, and time.     Cranial Nerves: No cranial nerve deficit.     Sensory: No sensory deficit.     Motor: No abnormal muscle tone.     Coordination: Coordination normal.     Comments: Unusual voice pattern.  Tremor arms and legs, bilaterally.  Normal strength arms legs bilaterally.  Psychiatric:        Mood and Affect: Mood normal.        Behavior: Behavior normal.        Thought Content: Thought content normal.        Judgment: Judgment normal.     ED Results / Procedures / Treatments   Labs (all labs ordered are listed, but only abnormal results are displayed) Labs Reviewed - No data to display  EKG None  Radiology DG Ribs Unilateral W/Chest Right  Result Date: 11/19/2019 CLINICAL DATA:  Status post recent fall. EXAM: RIGHT RIBS AND CHEST - 3+ VIEW COMPARISON:  September 10, 2019 FINDINGS: No fracture or other bone lesions are seen involving the ribs. Radiopaque pedicle screws are seen within the lower lumbar spine.  There is no evidence of pneumothorax or pleural effusion. Both lungs are clear. Radiopaque stimulators and associated stimulator wires are noted, bilaterally. These are present on the prior study. Heart size and mediastinal contours are within normal limits. Radiopaque surgical clips are seen overlying the right upper quadrant. IMPRESSION: 1. No acute or active cardiopulmonary disease. 2. No evidence of rib fracture. Electronically Signed   By: Virgina Norfolk M.D.   On: 11/19/2019 16:21   DG Pelvis 1-2 Views  Result Date: 11/19/2019 CLINICAL DATA:  Status post recent fall. EXAM: PELVIS - 1-2 VIEW COMPARISON:  None. FINDINGS: There is no evidence of pelvic fracture or diastasis. Bilateral radiopaque pedicle screws are seen at the levels of L4 and L5 vertebral bodies. No pelvic bone lesions are seen. IMPRESSION: No acute osseous abnormality. Electronically Signed   By: Virgina Norfolk M.D.   On: 11/19/2019 16:22    Procedures Procedures (including critical care time)  Medications Ordered in ED Medications - No data to display  ED Course  I have reviewed the triage vital signs and the nursing notes.  Pertinent labs & imaging results that were available during my care of the patient were reviewed by me and considered in my medical decision making (see chart for details).    MDM Rules/Calculators/A&P                           Patient Vitals for the past 24 hrs:  BP Temp Temp src Pulse Resp SpO2  11/19/19 1514 131/73 99 F (37.2 C) Oral 79 18 100 %    6:02 PM Reevaluation with update and discussion. After initial assessment and treatment, an updated evaluation reveals no further complaints, findings discussed and questions answered. Daleen Bo   Medical Decision Making:  This  patient is presenting for evaluation of subacute fall, which does not require a range of treatment options, and is not a complaint that involves a high risk of morbidity and mortality. The differential  diagnoses include contusion, fracture. I decided to review old records, and in summary with unsteady gait who lives in a nursing care facility presenting for evaluation of subacute injuries.  I did not require additional historical information from anyone.   Radiologic Tests Ordered, included right ribs, chest, pelvis.  I independently Visualized: Radiographic images, which show no fracture   Critical Interventions-clinical evaluation, radiographs ordered, observation reassessment  After These Interventions, the Patient was reevaluated and was found stable for discharge.  No fracture, vital signs normal, no indication for further ED treatment or hospitalization at this time.  CRITICAL CARE-no Performed by: Daleen Bo  Nursing Notes Reviewed/ Care Coordinated Applicable Imaging Reviewed Interpretation of Laboratory Data incorporated into ED treatment  The patient appears reasonably screened and/or stabilized for discharge and I doubt any other medical condition or other [facility] requiring further screening, evaluation, or treatment in the ED at this time prior to discharge.  Plan: Home Medications-Tylenol for pain, usual medicines; Home Treatments-gradually advance activity; return here if the recommended treatment, does not improve the symptoms; Recommended follow up-PCP, as needed     Final Clinical Impression(s) / ED Diagnoses Final diagnoses:  Contusion, multiple sites    Rx / DC Orders ED Discharge Orders    None       Daleen Bo, MD 11/19/19 [id number]

## 2019-11-19 NOTE — ED Triage Notes (Addendum)
Pt brought in by [facility]. EMS repots Pt c/o right hip pain for last 2 weeks.  Family was called by facility.   Spoke with Abigail Butts at facility [phone], she report pt told here today that she was having pt to right hip and refused tylenol.

## 2020-01-31 NOTE — Progress Notes (Signed)
Patient ID: [name], female    DOB: [birth year], 69 y.o.   MRN: [id number]  HPI  female former smoker followed for Allergic rhinitis, Asthma, complicated by anxiety/VCD, CAD, GERD, DM2, tachycardia, tremor/deep brain stimulator, HBP, now living in assisted living Allergy Profile 01/10/2013-total IgE 166 with significant elevations for most common allergens except molds She had been on allergy vaccine years ago. Office Spirometry 12/13/2014-mild obstruction, mild restriction of forced vital capacity Upper endoscopy 2016-[name]'s ring and gastric erosions ----------------------------------------------------------   08/02/19- 69 year old female former smoker followed for Allergic rhinitis, Asthma, complicated by anxiety/VCD, CAD, GERD/ LPR, DM, tachycardia, tremor/deep brain stimulator, HBP, now living in assisted living/ [place] -----f/u Asthma moderate persistent. Swallowing eval in Dec showed airway penetration. Rec thin liquids, vertical. ProAir hfa, Breo 100, neb albuterol,  Had 2 Covax- not sure which. Outside CXR reported clear 03/01/2019. She says after that she was hosp in [place] for Covid pneumonia sometime last winter. Mostly back to baseline. Used neb and rescue inhaler yesterday. Denies fever, discolored sputum. Trying to be careful swallowing.   02/01/20- 69 year old female former smoker followed for Allergic rhinitis, Asthma, complicated by anxiety/VCD, CAD, GERD/ LPR, DM, tachycardia, tremor/deep brain stimulator, BiPolar,  HBP, now living in assisted living/ [place], Covid infection 09/2019,  Hosp-June- altered mental status due to polypharmacy, also Covid infection ED in July for rash and in August for R sided pain after a fall 2 weeks prior.  Covid vax- 2 Moderna Flu vax- had Albuterol hfa, Breo 100, tessalon perles 100,  With Fall weather has been using neb about 3x daily, with only clear mucus. Doesn't feel sick. I can't tell that she recognizes any  residual from reported Covid infection earlier this summer.We discussed meds. Reports f/u CXR done yesterday.  CXR/ ribs 11/19/19-  IMPRESSION: 1. No acute or active cardiopulmonary disease. 2. No evidence of rib fracture.  ROS-see HPI    "+" = positive Constitutional:    weight loss, night sweats, fevers, chills, fatigue, lassitude. HEENT:    headaches, difficulty swallowing, tooth/dental problems, sore throat,       sneezing, itching, ear ache, nasal congestion, +post nasal drip, snoring CV:    chest pain, orthopnea, PND, swelling in lower extremities, anasarca,              dizziness, palpitations Resp:   +shortness of breath with exertion or at rest.                productive cough,    non-productive cough, coughing up of blood.              change in color of mucus.  +wheezing.   Skin:    rash or lesions. GI:  + heartburn, indigestion, abdominal pain, nausea, vomiting, GU:  MS:   joint pain, stiffness, decreased range of motion, back pain. Neuro-    + chronic tremor, +unsteady/ walker Psych:  +change in mood or affect.  +depression or anxiety.   memory loss.  OBJ- Physical Exam General- Alert, Oriented, Affect-appropriate, Distress- none acute, + wheelchair talkative,+obese,           Skin- rash-none, lesions- none, excoriation- none Lymphadenopathy- none Head- atraumatic            Eyes- Gross vision intact, PERRLA, conjunctivae and secretions clear            Ears- Hearing, canals-normal            Nose- Clear, no-Septal dev, mucus, polyps, erosion, perforation  Throat- Mallampati II , mucosa -tongue slightly coated , drainage- none, tonsils-                  atrophic, + missing teeth    Neck- flexible , trachea midline, no stridor , thyroid nl, carotid no bruit Chest - symmetrical excursion , unlabored           Heart/CV- RRR , no murmur , no gallop  , no rub, nl s1 s2                           - JVD- none , edema- none, stasis changes- none, varices- none            Lung-  unlabored, wheeze -none, cough-none , dullness-none, rub- none,            Chest wall-+ pacemaker  (deep brain stimulator) left Abd-  Br/ Gen/ Rectal- Not done, not indicated Extrem- cyanosis- none, clubbing, none, atrophy- none, strength- nl,  Neuro- + tremor and head bob,+ some dysarthria- I had to ask her to repeat several timess,

## 2020-02-01 ENCOUNTER — Other Ambulatory Visit: Payer: Self-pay

## 2020-02-01 ENCOUNTER — Encounter: Payer: Self-pay | Admitting: Internal Medicine

## 2020-02-01 ENCOUNTER — Ambulatory Visit (INDEPENDENT_AMBULATORY_CARE_PROVIDER_SITE_OTHER): Payer: Medicare Other | Admitting: Internal Medicine

## 2020-02-01 DIAGNOSIS — J9601 Acute respiratory failure with hypoxia: Secondary | ICD-10-CM

## 2020-02-01 DIAGNOSIS — U071 COVID-19: Secondary | ICD-10-CM

## 2020-02-01 DIAGNOSIS — J45909 Unspecified asthma, uncomplicated: Secondary | ICD-10-CM | POA: Diagnosis not present

## 2020-02-01 NOTE — Assessment & Plan Note (Signed)
Moderate persistent. Uncertain if mild exacerbation, but current meds should be sufficient.  Plan- she can use her nebulizer up to 4 x daily along with her other meds, if needed.

## 2020-02-01 NOTE — Patient Instructions (Signed)
We can continue current meds  You can use your nebulizer up to 4 times daily, nasal spray up to 2 times daily and albuterol rescue inhaler up to 4 times daily, if needed  Continue Breo inhaler 1 times daily  Please call if we can help

## 2020-02-01 NOTE — Assessment & Plan Note (Signed)
No ongoing need for O2 identified.

## 2020-02-19 ENCOUNTER — Encounter: Payer: Self-pay | Admitting: Internal Medicine

## 2020-05-14 ENCOUNTER — Ambulatory Visit (INDEPENDENT_AMBULATORY_CARE_PROVIDER_SITE_OTHER): Payer: 59 | Admitting: Gastroenterology

## 2020-05-14 ENCOUNTER — Encounter: Payer: Self-pay | Admitting: Gastroenterology

## 2020-05-14 ENCOUNTER — Other Ambulatory Visit: Payer: Self-pay

## 2020-05-14 ENCOUNTER — Ambulatory Visit: Payer: Medicare Other | Admitting: Nurse Practitioner

## 2020-05-14 VITALS — BP 107/70 | HR 86 | Temp 96.8°F | Ht 64.5 in | Wt 188.0 lb

## 2020-05-14 DIAGNOSIS — Z86010 Personal history of colonic polyps: Secondary | ICD-10-CM

## 2020-05-14 DIAGNOSIS — D649 Anemia, unspecified: Secondary | ICD-10-CM | POA: Insufficient documentation

## 2020-05-14 DIAGNOSIS — K219 Gastro-esophageal reflux disease without esophagitis: Secondary | ICD-10-CM | POA: Diagnosis not present

## 2020-05-14 DIAGNOSIS — K5900 Constipation, unspecified: Secondary | ICD-10-CM

## 2020-05-14 MED ORDER — LUBIPROSTONE 24 MCG PO CAPS
24.0000 ug | ORAL_CAPSULE | Freq: Two times a day (BID) | ORAL | 5 refills | Status: DC
Start: 2020-05-14 — End: 2021-03-20

## 2020-05-14 NOTE — Patient Instructions (Addendum)
1. For constipation: Increase Amitiza to 24 mcg twice daily with food.  Prescription sent to [facility]. 2. Call if patient continues to have constipation issues. 3. Please collect labs, CBC, iron, ferritin, TIBC 4. We will see her back in 4-6 weeks.  At that time schedule her for colonoscopy assuming constipation has been adequately managed.

## 2020-05-14 NOTE — Progress Notes (Signed)
Primary Care Physician: Abran Richard, MD  Primary Gastroenterologist:  Garfield Cornea, MD   Chief Complaint  Patient presents with  . Emesis    Vomiting every 3 weeks    HPI: [name] is a 70 y.o. female here for follow-up.  Last seen in August.  History of GERD and constipation.  EGD September 2020, mild [name] ring status post dilation.  Multiple erosions in the stomach, biopsies negative.  Colonoscopy November 2016, diverticulosis, internal hemorrhoids, tubular adenoma removed.  Consider colonoscopy in 5 years.  SLP evaluation December 2020 with mild pharyngeal aspiration with wet quality to improve after clearing of the throat recommended regular textures and thin liquids but no straws and standard aspiration reflux precautions.  Patient's daughter, [name].  [phone]  Patient resides at [facility] in [place].  She presents alone today.  Seems to be a good historian.  Biggest complaint is constipation.  Has a bowel movement only twice per week at most.  She denies diarrhea, melena, rectal bleeding, abdominal pain.  Denies heartburn.  No dysphagia.  Has vomiting occasionally.  Estimates once every 3 to 4 weeks.  Typically occurs just 1 time and does not limit her oral intake.  She believes her constipation is aggravated by her diet.  She states the food at the [facility] and is awful.  She is surviving in peanut butter and jelly sandwiches.  Generally drinks 3 glasses of water daily, 2 glasses of tea.  Drinks Coke when she has it, no more than once per day.  For her constipation, she takes Amitiza 8 mcg twice daily, Colace 100 mg daily.  She is not taking any of her as needed medications for constipation.  Son age 81, died with brain tumor.   Current Outpatient Medications  Medication Sig Dispense Refill  . acetaminophen (TYLENOL) 500 MG tablet Take 500 mg by mouth every 6 (six) hours as needed for mild pain, fever or headache.     . albuterol  (PROAIR HFA) 108 (90 Base) MCG/ACT inhaler Inhale 2 puffs every 6 hours for asthma - rescue inhaler 1 Inhaler 12  . alum & mag hydroxide-simeth (MAALOX/MYLANTA) 200-200-20 MG/5ML suspension Take 30 mLs by mouth as needed for indigestion or heartburn. Max 4 doses per 24 hours    . Ascorbic Acid (VITAMIN C WITH ROSE HIPS) 500 MG tablet Take 500 mg by mouth in the morning.    Marland Kitchen aspirin 81 MG chewable tablet Chew 81 mg by mouth in the morning.     . baclofen (LIORESAL) 10 MG tablet Take 10 mg by mouth at bedtime.    . Carboxymethylcellulose Sod PF 0.5 % SOLN Place 1 drop into both eyes 2 (two) times daily.     . divalproex (DEPAKOTE) 500 MG DR tablet Take 500 mg by mouth 3 (three) times daily.     Marland Kitchen docusate sodium (COLACE) 100 MG capsule Take 100 mg by mouth in the morning.     . fluticasone furoate-vilanterol (BREO ELLIPTA) 100-25 MCG/INH AEPB Inhale 1 puff into the lungs daily. 1 each 11  . gabapentin (NEURONTIN) 300 MG capsule Take 300 mg by mouth See admin instructions. Take 300 mg by mouth in the morning and 600 mg at bedtime    . glucose 4 GM chewable tablet Chew 5 tablets by mouth as needed for low blood sugar. Recheck FSBS in 15 minutes and repeat if <40    . guaifenesin (HUMIBID E) 400 MG TABS tablet Take 400  mg by mouth every 4 (four) hours as needed.    Marland Kitchen ibuprofen (ADVIL) 800 MG tablet Take 800 mg by mouth every 8 (eight) hours as needed.    . insulin glargine (LANTUS) 100 UNIT/ML injection Inject 15 Units into the skin at bedtime. *Hold if blood glucose is less than 160*    . insulin lispro (HUMALOG) 100 UNIT/ML injection Inject into the skin 3 (three) times daily before meals. 0-59 call MD 60-200=0 201-250= 2 units 251-300=4 units 301-350= 6 units 351-400= 8 units 401-450= 10 units 451+ Call MD    . ipratropium (ATROVENT) 0.03 % nasal spray Place 2 sprays into both nostrils 3 (three) times daily. Before meals    . lactulose (CHRONULAC) 10 GM/15ML solution Take 15 g by mouth daily as  needed for mild constipation.     Marland Kitchen levothyroxine (SYNTHROID, LEVOTHROID) 75 MCG tablet TAKE (1) TABLET BY MOUTH ONCE DAILY BEFORE BREAKFAST. 30 tablet 5  . loperamide (IMODIUM A-D) 2 MG tablet Take 2 mg by mouth 3 (three) times daily as needed for diarrhea or loose stools.     . lubiprostone (AMITIZA) 8 MCG capsule Take 1 capsule (8 mcg total) by mouth 2 (two) times daily with a meal. 180 capsule 3  . magnesium hydroxide (MILK OF MAGNESIA) 400 MG/5ML suspension Take 30 mLs by mouth at bedtime as needed for mild constipation.     . metFORMIN (GLUCOPHAGE) 500 MG tablet Take by mouth 2 (two) times daily with a meal.    . metoprolol succinate (TOPROL-XL) 50 MG 24 hr tablet Take 50 mg by mouth in the morning. Take with or immediately following a meal.    . Multiple Vitamin (MULTIVITAMIN) tablet Take 1 tablet by mouth daily.    Marland Kitchen omega-3 acid ethyl esters (LOVAZA) 1 g capsule Take 1 g by mouth daily.    . pantoprazole (PROTONIX) 40 MG tablet Take 40 mg by mouth daily before breakfast.     . PARoxetine (PAXIL) 20 MG tablet Take 20 mg by mouth daily.    Vladimir Faster Glycol-Propyl Glycol (SYSTANE) 0.4-0.3 % SOLN Apply to eye as needed.    . polyethylene glycol (MIRALAX / GLYCOLAX) packet Take 17 g by mouth daily as needed for moderate constipation. 14 each 0  . potassium chloride SA (K-DUR,KLOR-CON) 20 MEQ tablet Take 20 mEq by mouth daily.    . primidone (MYSOLINE) 50 MG tablet Take 50 mg by mouth in the morning.     Marland Kitchen QUEtiapine (SEROQUEL) 100 MG tablet Take 100 mg by mouth at bedtime.    . senna (SENOKOT) 8.6 MG TABS tablet Take 2 tablets by mouth daily as needed for mild constipation.    . sitaGLIPtin (JANUVIA) 100 MG tablet Take 100 mg by mouth daily.    . sodium chloride (OCEAN) 0.65 % nasal spray Place 2 sprays into the nose as needed (Rhinitis). Both nostrils    . sodium chloride 1 g tablet Take 1 g by mouth 2 (two) times daily.    Minus Liberty Tosylate 40 MG CAPS Take 40 mg by mouth daily.    Marland Kitchen  zinc gluconate 50 MG tablet Take 50 mg by mouth daily.    . benzonatate (TESSALON PERLES) 100 MG capsule Take 1 capsule (100 mg total) by mouth 3 (three) times daily as needed for cough. 30 capsule 0  . Cholecalciferol (VITAMIN D-3) 125 MCG (5000 UT) TABS Take 5,000 Units by mouth in the morning.      No current facility-administered medications  for this visit.    Allergies as of 05/14/2020 - Review Complete 05/14/2020  Allergen Reaction Noted  . Invokana [canagliflozin] Other (See Comments) 12/05/2014  . Losartan Other (See Comments) 01/22/2014  . Metformin and related Diarrhea 01/18/2013  . Ace inhibitors  09/19/2019  . Latex Other (See Comments) 07/02/2014  . Other Other (See Comments) 07/02/2014  . Oxycodone-acetaminophen Itching and Other (See Comments) 05/11/2011  . Mucinex [guaifenesin er] Other (See Comments)     ROS:  General: Negative for anorexia, weight loss, fever, chills, fatigue, weakness. ENT: Negative for hoarseness, difficulty swallowing , nasal congestion. CV: Negative for chest pain, angina, palpitations, dyspnea on exertion, peripheral edema.  Respiratory: Negative for dyspnea at rest, dyspnea on exertion, cough, sputum, wheezing.  GI: See history of present illness. GU:  Negative for dysuria, hematuria, urinary incontinence, urinary frequency, nocturnal urination.  Endo: Negative for unusual weight change.    Physical Examination:   BP 107/70   Pulse 86   Temp (!) 96.8 F (36 C) (Temporal)   Ht 5' 4.5" (1.638 m)   Wt 188 lb (85.3 kg) Comment: Pt stated  BMI 31.77 kg/m   General: Well-nourished, well-developed in no acute distress.  Eyes: No icterus. Mouth: masked Lungs: Clear to auscultation bilaterally.  Heart: Regular rate and rhythm, no murmurs rubs or gallops.  Abdomen: Bowel sounds are normal, nontender, nondistended, no hepatosplenomegaly or masses, no abdominal bruits or hernia , no rebound or guarding.   Extremities:trace bialteral lower  extremity edema. No clubbing or deformities. Neuro: Alert and oriented x 4   Skin: Warm and dry, no jaundice.   Psych: Alert and cooperative, normal mood and affect.  Labs:  Lab Results  Component Value Date   ALT 10 09/21/2019   AST 13 09/21/2019   ALKPHOS 45 09/21/2019   BILITOT 0.5 09/15/2019   Lab Results  Component Value Date   CREATININE 0.5 09/21/2019   BUN 12 09/21/2019   NA 136 (A) 09/21/2019   K 3.9 09/21/2019   CL 103 09/21/2019   CO2 23 (A) 09/21/2019   Lab Results  Component Value Date   WBC 4.5 09/21/2019   HGB 11.8 (A) 09/21/2019   HCT 35 (A) 09/21/2019   MCV 98.4 09/15/2019   PLT 82 (A) 09/21/2019   Lab Results  Component Value Date   IRON 67 09/11/2019   TIBC 243 (L) 09/11/2019   FERRITIN 120 09/14/2019   Lab Results  Component Value Date   VITAMINB12 316 09/11/2019   Lab Results  Component Value Date   FOLATE 26.0 09/11/2019     Imaging Studies: No results found.  Assessment/plan:  Pleasant 69 year old female with multiple medical problems as outlined above presenting to follow-up on constipation, GERD, dysphagia.  GERD: Doing well on pantoprazole 40 mg daily.  Occasional vomiting every 3 to 4 weeks, single episode may be related to GERD or food intolerance.  Patient is not happy with meals provided at her current facility.  She denies weight loss.  Overall her swallowing is stable, no complaints today.  Constipation: Biggest concern.  BM only 1-2 times per week.  Taking Colace 100 mg daily and Amitiza 8 mcg twice daily.  She never request as needed medications available to her for constipation.  We will plan to increase on Amitiza 24 mcg twice daily.  Will have staff call if she has ongoing constipation concerns.  Otherwise we will see her back in 4 to 6 weeks and assuming her constipation is adequately managed, we  will plan for surveillance colonoscopy at that time.  Patient is agreeable to plan.  Anemia/thrombocytopenia: Noted during  hospitalization in June when she presented with Covid.  Follow-up on labs.

## 2020-07-01 ENCOUNTER — Other Ambulatory Visit: Payer: Self-pay

## 2020-07-01 ENCOUNTER — Ambulatory Visit (INDEPENDENT_AMBULATORY_CARE_PROVIDER_SITE_OTHER): Payer: 59 | Admitting: Gastroenterology

## 2020-07-01 ENCOUNTER — Encounter: Payer: Self-pay | Admitting: Gastroenterology

## 2020-07-01 VITALS — BP 128/86 | HR 77 | Temp 97.1°F | Ht 64.5 in | Wt 188.0 lb

## 2020-07-01 DIAGNOSIS — D649 Anemia, unspecified: Secondary | ICD-10-CM | POA: Diagnosis not present

## 2020-07-01 DIAGNOSIS — K5900 Constipation, unspecified: Secondary | ICD-10-CM | POA: Diagnosis not present

## 2020-07-01 NOTE — Patient Instructions (Signed)
1. Please send copy of latest labs done in the past 8 weeks regarding anemia, i.e. CBC, iron, ferritin.  Fax to [phone]. 2. Please call and let us know what dose patient is taking Amitiza.  It is not clear by the records sent today.  Patient states that her dose has been decreased and/or stopped.  Call [phone]. 3. We will be in touch to schedule colonoscopy in future.

## 2020-07-01 NOTE — Progress Notes (Signed)
Primary Care Physician: Abran Richard, MD  Primary Gastroenterologist:  Garfield Cornea, MD   Chief Complaint  Patient presents with  . Anemia    F/u  . Constipation    Doing better. Amitiza was causing diarrhea. PA at [facility] cut it back to once a day and not having diarrhea    HPI: [name] is a 70 y.o. female here for follow-up.  Patient last seen in February 2022.  History of GERD and constipation.  After last visit we increased her Amitiza from 8 mcg twice daily to 24 mcg twice daily.  Patient states she started having a lot of diarrhea.  Seem to be constant.  States that the provider at the nursing home decreased her Amitiza to once daily.  We need to verify however because it is not clear based on the records sent to [place] today.  Patient also states that she started taking her medications with food over the weekend and feels like that is helped.  Last bowel movement was Saturday.  No melena or rectal bleeding.  No abdominal pain.  No nausea or vomiting.  No heartburn.  No dysphagia.  Multiple medications available to her for management of constipation on a as needed basis including lactulose 10 g daily as needed, milk of magnesia 30 cc nightly as needed, MiraLAX 17 g daily as needed.  She does receive Colace (docusate sodium) 100 mg every morning.  Amitiza 24 mcg twice daily ordered but it does not clear if she is actually receiving at this time.  Patient states that her nurse is [employer].  EGD September 2020, mild [name] ring status post dilation.  Multiple erosions in the stomach, biopsies negative.  Colonoscopy November 2016, diverticulosis, internal hemorrhoids, tubular adenoma removed. Consider colonoscopy in 5 years.  SLP evaluation December 2020 with mild pharyngeal aspiration with wet quality to improve after clearing of the throat recommended regular textures and thin liquids but no straws and standard aspiration reflux precautions.  Patient's  daughter, [name].  [phone]    Current Outpatient Medications  Medication Sig Dispense Refill  . acetaminophen (TYLENOL) 500 MG tablet Take 500 mg by mouth every 6 (six) hours as needed for mild pain, fever or headache.     . albuterol (PROAIR HFA) 108 (90 Base) MCG/ACT inhaler Inhale 2 puffs every 6 hours for asthma - rescue inhaler 1 Inhaler 12  . alum & mag hydroxide-simeth (MAALOX/MYLANTA) 200-200-20 MG/5ML suspension Take 30 mLs by mouth as needed for indigestion or heartburn. Max 4 doses per 24 hours    . Ascorbic Acid (VITAMIN C WITH ROSE HIPS) 500 MG tablet Take 500 mg by mouth in the morning.    Marland Kitchen aspirin 81 MG chewable tablet Chew 81 mg by mouth in the morning.     . baclofen (LIORESAL) 10 MG tablet Take 10 mg by mouth at bedtime.    . Carboxymethylcellulose Sod PF 0.5 % SOLN Place 1 drop into both eyes 2 (two) times daily.     . divalproex (DEPAKOTE) 500 MG DR tablet Take 500 mg by mouth 3 (three) times daily.     Marland Kitchen docusate sodium (COLACE) 100 MG capsule Take 100 mg by mouth in the morning.     . fluticasone furoate-vilanterol (BREO ELLIPTA) 100-25 MCG/INH AEPB Inhale 1 puff into the lungs daily. 1 each 11  . gabapentin (NEURONTIN) 300 MG capsule Take 300 mg by mouth See admin instructions. Take 300 mg by mouth in the morning  and 600 mg at bedtime    . Glucagon, rDNA, (GLUCAGON EMERGENCY) 1 MG KIT Inject as directed. Inject 1 mg intramuscularly every 24 hours as needed for low blood glucose blood glucose below 60 and unable to take oral glucose replacement.    Marland Kitchen guaiFENesin-dextromethorphan (ROBITUSSIN DM) 100-10 MG/5ML syrup Take 5 mLs by mouth every 4 (four) hours as needed for cough.    Marland Kitchen ibuprofen (ADVIL) 800 MG tablet Take 800 mg by mouth every 8 (eight) hours as needed.    . insulin glargine (LANTUS) 100 UNIT/ML injection Inject 15 Units into the skin at bedtime. *Hold if blood glucose is less than 160*    . insulin lispro (HUMALOG) 100 UNIT/ML injection Inject into  the skin 3 (three) times daily before meals. 0-59 call MD 60-200=0 201-250= 2 units 251-300=4 units 301-350= 6 units 351-400= 8 units 401-450= 10 units 451+ Call MD    . ipratropium (ATROVENT) 0.03 % nasal spray Place 2 sprays into both nostrils 3 (three) times daily. Before meals    . ipratropium-albuterol (DUONEB) 0.5-2.5 (3) MG/3ML SOLN Take 3 mLs by nebulization. 3 ml inhale orally every 6 hours as needed for SOB/wheezing    . lactulose (CHRONULAC) 10 GM/15ML solution Take 15 g by mouth daily as needed for mild constipation.     Marland Kitchen levothyroxine (SYNTHROID, LEVOTHROID) 75 MCG tablet TAKE (1) TABLET BY MOUTH ONCE DAILY BEFORE BREAKFAST. 30 tablet 5  . loperamide (IMODIUM A-D) 2 MG tablet Take 2 mg by mouth 3 (three) times daily as needed for diarrhea or loose stools.     . lubiprostone (AMITIZA) 24 MCG capsule Take 1 capsule (24 mcg total) by mouth 2 (two) times daily with a meal. 60 capsule 5  . magnesium hydroxide (MILK OF MAGNESIA) 400 MG/5ML suspension Take 30 mLs by mouth at bedtime as needed for mild constipation.     . Menthol, Topical Analgesic, (BIOFREEZE) 4 % GEL Apply topically 2 (two) times daily. Apply to lower back topically 2 times a day for back pain.    . metFORMIN (GLUCOPHAGE) 500 MG tablet Take by mouth 2 (two) times daily with a meal.    . metoprolol succinate (TOPROL-XL) 50 MG 24 hr tablet Take 50 mg by mouth in the morning. Take with or immediately following a meal.    . Multiple Vitamin (MULTIVITAMIN) tablet Take 1 tablet by mouth daily.    Marland Kitchen omega-3 acid ethyl esters (LOVAZA) 1 g capsule Take 1 g by mouth daily.    . pantoprazole (PROTONIX) 40 MG tablet Take 40 mg by mouth daily before breakfast.     . Polyethyl Glycol-Propyl Glycol (SYSTANE) 0.4-0.3 % SOLN Apply to eye as needed.    . polyethylene glycol (MIRALAX / GLYCOLAX) packet Take 17 g by mouth daily as needed for moderate constipation. 14 each 0  . potassium chloride SA (K-DUR,KLOR-CON) 20 MEQ tablet Take 20  mEq by mouth daily.    . primidone (MYSOLINE) 50 MG tablet Take 50 mg by mouth in the morning.     Marland Kitchen QUEtiapine (SEROQUEL) 100 MG tablet Take 100 mg by mouth at bedtime.    . senna (SENOKOT) 8.6 MG TABS tablet Take 2 tablets by mouth daily as needed for mild constipation.    . sertraline (ZOLOFT) 50 MG tablet Take 50 mg by mouth daily.    . sitaGLIPtin (JANUVIA) 100 MG tablet Take 100 mg by mouth daily.    . sodium chloride (OCEAN) 0.65 % nasal spray Place 2 sprays  into the nose as needed (Rhinitis). Both nostrils    . sodium chloride 1 g tablet Take 1 g by mouth 2 (two) times daily.    Minus [place] Tosylate 40 MG CAPS Take 40 mg by mouth daily.    Marland Kitchen zinc gluconate 50 MG tablet Take 50 mg by mouth daily.     No current facility-administered medications for this visit.    Allergies as of 07/01/2020 - Review Complete 07/01/2020  Allergen Reaction Noted  . Invokana [canagliflozin] Other (See Comments) 12/05/2014  . Losartan Other (See Comments) 01/22/2014  . Metformin and related Diarrhea 01/18/2013  . Ace inhibitors  09/19/2019  . Latex Other (See Comments) 07/02/2014  . Other Other (See Comments) 07/02/2014  . Oxycodone-acetaminophen Itching and Other (See Comments) 05/11/2011  . Mucinex [guaifenesin er] Other (See Comments)    Past Medical History:  Diagnosis Date  . Allergic rhinitis    uses Nasonex daily as needed  . Allergy to angiotensin receptor blockers (ARB) 01/22/2014   Angioedema  . Anxiety    takes Ativan daily as needed  . Aspiration pneumonia ([place])   . Asthma    Albuterol inhaler and Neb daily as needed  . 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 [name], residual palpitations  . Bipolar 1 disorder ([place])   . Chronic back pain    reason unknown  . Constipation   . COPD (chronic obstructive pulmonary disease) ([place])   . DDD (degenerative disc disease), lumbar   . Depression    takes Zoloft daily as well as Cymbalta  .  Diabetes mellitus    Type II  . Dysphagia   . Dysrhythmia    SVT ==ablation done by Dr. Rayann Heman 2007  . Esophageal reflux    takes Zantac daily  . Frequent headaches   . History of bronchitis Dec 2014  . Hypertension    takes Losartan daily  . Hypothyroidism    takes Synthroid daily  . Joint swelling    left ankle  . Low sodium syndrome   . Lumbar radiculopathy   . Seizures ([place]) 04-05-13   one time ran out of Primidone and had stopped it cold [place]  . SVT (supraventricular tachycardia) ([place])   . Tremor, essential    takes Mysoline and Neurontin daily.  . Vocal cord dysfunction   . Weakness    in left arm   Past Surgical History:  Procedure Laterality Date  . ABDOMINAL HYSTERECTOMY    . ABLATION FOR AVNRT    . APPENDECTOMY    . BARTHOLIN GLAND CYST EXCISION     x 2  . BIOPSY  03/04/2015   Procedure: BIOPSY (Duodenal, Gastric);  Surgeon: Daneil Dolin, MD;  Location: [facility];  Service: Endoscopy;;  . BIOPSY  12/19/2018   Procedure: BIOPSY;  Surgeon: Daneil Dolin, MD;  Location: [facility];  Service: Endoscopy;;  Gastric Biopsies    . BREAST SURGERY    . CESAREAN SECTION    . CHOLECYSTECTOMY    . COLONOSCOPY  01/16/2004   [id number] rectum/colon  . COLONOSCOPY WITH PROPOFOL N/A 03/04/2015   Dr.[name]- internal hemorrhoids, pancolonic diverticulosis, colonic polyp= tubular adenoma  . DEEP BRAIN STIMULATOR PLACEMENT    . DENTAL SURGERY    . ESOPHAGEAL DILATION N/A 03/04/2015   Procedure: ESOPHAGEAL DILATION WITH 54FR MALONEY DILATOR;  Surgeon: Daneil Dolin, MD;  Location: [facility];  Service: Endoscopy;  Laterality: N/A;  . ESOPHAGEAL DILATION  12/19/2018   Procedure: ESOPHAGEAL DILATION;  Surgeon: Daneil Dolin, MD;  [facility];  Service: Endoscopy;;  . ESOPHAGOGASTRODUODENOSCOPY (EGD) WITH ESOPHAGEAL DILATION  04/03/2002   [id number] ring, otherwise normal esophagus, status post dilation with 56 F/Normal stomach  . ESOPHAGOGASTRODUODENOSCOPY  (EGD) WITH ESOPHAGEAL DILATION N/A 11/08/2012   Dr. [name]:schatzki's ring s/p dilation/hiatal hernia  . ESOPHAGOGASTRODUODENOSCOPY (EGD) WITH PROPOFOL N/A 03/04/2015   Dr.[name]- schatzki's ring, hiatal hernia, scattered erosions bx= chronic inactive gastritis  . ESOPHAGOGASTRODUODENOSCOPY (EGD) WITH PROPOFOL N/A 05/06/2017   Normal esophagus. Dilated. Small hiatal hernia. Normal duodenal bulb and second portion of duodenum.  . ESOPHAGOGASTRODUODENOSCOPY (EGD) WITH PROPOFOL N/A 12/19/2018   [name]: Mild [name] ring status post dilation, multiple erosions in the stomach, biopsies negative.  Marland Kitchen FINGER SURGERY    . HEMORRHOID BANDING  2017   Dr.[name]  . LEFT ANKLE LIGAMENT REPAIR     x 2  . LEFT ELBOW REPAIR    . MALONEY DILATION N/A 05/06/2017   Procedure: Venia Minks DILATION;  Surgeon: Daneil Dolin, MD;  [facility];  Service: Endoscopy;  Laterality: N/A;  . Venia Minks DILATION N/A 12/19/2018   Procedure: Venia Minks DILATION;  Surgeon: Daneil Dolin, MD;  [facility];  Service: Endoscopy;  Laterality: N/A;  . MAXIMUM ACCESS (MAS)POSTERIOR LUMBAR INTERBODY FUSION (PLIF) 1 LEVEL N/A 04/24/2014   Procedure: Lumbar four-five Maximum access Surgery posterior lumbar interbody fusion;  Surgeon: [name], MD;  Location: [facility];  Service: Neurosurgery;  Laterality: N/A;  Lumbar four-five Maximum access Surgery posterior lumbar interbody fusion  . MULTIPLE EXTRACTIONS WITH ALVEOLOPLASTY N/A 06/18/2017   Procedure: MULTIPLE EXTRACTION;  Surgeon: Diona Browner, DDS;  Location: [facility];  Service: Oral Surgery;  Laterality: N/A;  . PARTIAL HYSTERECTOMY    . POLYPECTOMY  03/04/2015   Procedure: POLYPECTOMY (descending colon);  Surgeon: Daneil Dolin, MD;  Location: [facility];  Service: Endoscopy;;  . PULSE GENERATOR IMPLANT Bilateral 12/15/2013   Procedure: BILATERAL PULSE GENERATOR IMPLANT;  Surgeon: [name], MD;  Location: [facility];  Service: Neurosurgery;  Laterality: Bilateral;   BILATERAL  . RIGHT BREAST CYST     benign  . SUBTHALAMIC STIMULATOR BATTERY REPLACEMENT Bilateral 06/24/2018   Procedure: Bilateral implantable pulse generator battery change;  Surgeon: [name], MD;  Location: [facility];  Service: Neurosurgery;  Laterality: Bilateral;  bilateral  . SUBTHALAMIC STIMULATOR INSERTION Bilateral 12/04/2013   Procedure: Bilateral Deep brain stimulator placement;  Surgeon: [name], MD;  Location: [facility];  Service: Neurosurgery;  Laterality: Bilateral;  Bilateral Deep brain stimulator placement  . TONSILLECTOMY     Family History  Problem Relation Age of Onset  . Lung cancer Father        DIED AGE 56 LUNG CA  . Alcohol abuse Father   . Anxiety disorder Father   . Depression Father   . COPD Mother        DIED AGE 51,MRSA,COPD,PNEUMONIA  . Pneumonia Mother        DIED AGE 51,MRSA,COPD,PNEUMONIA  . Supraventricular tachycardia Mother   . Anxiety disorder Mother   . Depression Mother   . Alcohol abuse Mother   . Brain cancer Son        Died at age 67  . Heart disease Sister        DIED AGE 46, SMOKER,?HEART  . COPD Brother        AGE 4  . Colon cancer Neg Hx    Social History  Tobacco Use  . Smoking status: Former Smoker    Packs/day: 0.30    Years: 10.00    Pack years: 3.00    Types: Cigarettes    Quit date: 04/23/1993    Years since quitting: 27.2  . Smokeless tobacco: Never Used  . Tobacco comment: quit smoking 25+yrs ago  Vaping Use  . Vaping Use: Never used  Substance Use Topics  . Alcohol use: No    Alcohol/week: 0.0 standard drinks  . Drug use: No    ROS:  General: Negative for anorexia, weight loss, fever, chills, fatigue, weakness. ENT: Negative for hoarseness, difficulty swallowing , nasal congestion. CV: Negative for chest pain, angina, palpitations, dyspnea on exertion, peripheral edema.  Respiratory: Negative for dyspnea at rest, dyspnea on exertion, cough, sputum, wheezing.  GI: See history of present  illness. GU:  Negative for dysuria, hematuria, urinary incontinence, urinary frequency, nocturnal urination.  Endo: Negative for unusual weight change.    Physical Examination:   BP 128/86   Pulse 77   Temp (!) 97.1 F (36.2 C)   Ht 5' 4.5" (1.638 m)   Wt 188 lb (85.3 kg)   BMI 31.77 kg/m   General: Well-nourished, well-developed in no acute distress.  Eyes: No icterus. Mouth: masked Lungs: Clear to auscultation bilaterally.  Heart: Regular rate and rhythm, no murmurs rubs or gallops.  Abdomen: Bowel sounds are normal, nontender, nondistended, no hepatosplenomegaly or masses, no abdominal bruits or hernia , no rebound or guarding.   Extremities: No lower extremity edema. No clubbing or deformities. Neuro: Alert and oriented x 4   Skin: Warm and dry, no jaundice.   Psych: Alert and cooperative, normal mood and affect.  Labs:  Requested  Imaging Studies: No results found.  Assessment/plan:  Pleasant 70 year old female with multiple medical problems as outlined previously, here for follow-up of constipation.  She also has a history of GERD/dysphagia.  GERD: Well-controlled at this time.  Really no complaints of dysphagia.  Continue current regimen.  Constipation: Back in February when patient was seen she reported 1-2 BMs per week.  At that time she was taking Colace 100 mg daily and Amitiza 8 mcg twice daily.  She reported never requesting as needed medications for constipation.  We increased her to Amitiza 24 mcg twice daily.  Patient states she started having a lot of diarrhea and the provider at the facility decreased her to once daily.  Records available to me are not clear.  We will make contact with the facility to find out more information about current dosage she is receiving.  Last bowel movement at this point was 2 days ago.  Suspect diarrhea related to increased Amitiza dosage.  Anemia/thrombocytopenia: Noted during hospitalization back in June when she presented with  Covid.  We ordered labs back in February, never received.  Request most recent labs for follow-up.  History of adenomatous colon polyps: Patient's last colonoscopy November 2016.  She is interested in pursuing surveillance colonoscopy at this time.  Once we verify medications, we will schedule her for procedure. She will need propofol. ASA IV.  I have discussed the risks, alternatives, benefits with regards to but not limited to the risk of reaction to medication, bleeding, infection, perforation and the patient is agreeable to proceed. Written consent to be obtained.

## 2020-07-02 ENCOUNTER — Encounter: Payer: Self-pay | Admitting: Gastroenterology

## 2020-07-09 NOTE — Progress Notes (Signed)
SUBJECTIVE:  70 y.o. new to me Previously seen by SK.  History of tachycardia / palpitaitons post AVNRT ablation 2009 She has multiple co morbid conditions She is former smoker with asthma, anxiety, GERD, DM-2, tremor with deep brain stimulator She has some aspiration and [name] ring. She has had COVID x 2 last winter/summer and has had Moderna vaccine x 2. History of MS changes due to polypharmacy Chronic LE edema from venous insufficiency   Daughter [name] [phone] does not visit her much at [facility]  She sees Dr Tat for her movement disorder   No cardiac complaints   Soc Hx: Divorced  Lives in assisted living [place]: As per "subjective", otherwise negative.  Allergies  Allergen Reactions  . Invokana [Canagliflozin] Other (See Comments)    Yeast infectios  . Losartan Other (See Comments)    Angioedema   . Metformin And Related Diarrhea  . Ace Inhibitors     History of angioedema with losartan, ARB  . Latex Other (See Comments)    Other Reaction: redness, blisters  . Other Other (See Comments)    Air fresheners - on MAR  . Oxycodone-Acetaminophen Itching and Other (See Comments)    TYLOX. Caused internal itching per patient   . Mucinex [Guaifenesin Er] Other (See Comments)    MUCINEX REACTION: asthma attacks    Current Outpatient Medications  Medication Sig Dispense Refill  . acetaminophen (TYLENOL) 500 MG tablet Take 500 mg by mouth every 6 (six) hours as needed for mild pain, fever or headache.     . albuterol (PROAIR HFA) 108 (90 Base) MCG/ACT inhaler Inhale 2 puffs every 6 hours for asthma - rescue inhaler 1 Inhaler 12  . alum & mag hydroxide-simeth (MAALOX/MYLANTA) 200-200-20 MG/5ML suspension Take 30 mLs by mouth as needed for indigestion or heartburn. Max 4 doses per 24 hours    . Ascorbic Acid (VITAMIN C WITH ROSE HIPS) 500 MG tablet Take 500 mg by mouth in the morning.    Marland Kitchen aspirin 81 MG chewable tablet Chew 81 mg by  mouth in the morning.     . baclofen (LIORESAL) 10 MG tablet Take 10 mg by mouth at bedtime.    . Carboxymethylcellulose Sod PF 0.5 % SOLN Place 1 drop into both eyes 2 (two) times daily.     . divalproex (DEPAKOTE) 500 MG DR tablet Take 500 mg by mouth 3 (three) times daily.     Marland Kitchen docusate sodium (COLACE) 100 MG capsule Take 100 mg by mouth in the morning.     . fluticasone furoate-vilanterol (BREO ELLIPTA) 100-25 MCG/INH AEPB Inhale 1 puff into the lungs daily. 1 each 11  . gabapentin (NEURONTIN) 300 MG capsule Take 300 mg by mouth See admin instructions. Take 300 mg by mouth in the morning and 600 mg at bedtime    . Glucagon, rDNA, (GLUCAGON EMERGENCY) 1 MG KIT Inject as directed. Inject 1 mg intramuscularly every 24 hours as needed for low blood glucose blood glucose below 60 and unable to take oral glucose replacement.    Marland Kitchen guaiFENesin-dextromethorphan (ROBITUSSIN DM) 100-10 MG/5ML syrup Take 5 mLs by mouth every 4 (four) hours as needed for cough.    Marland Kitchen ibuprofen (ADVIL) 800 MG tablet Take 800 mg by mouth every 8 (eight) hours as needed.    . insulin glargine (LANTUS) 100 UNIT/ML injection Inject 15 Units into the skin at bedtime. *Hold if blood glucose is less than 160*    .  insulin lispro (HUMALOG) 100 UNIT/ML injection Inject into the skin 3 (three) times daily before meals. 0-59 call MD 60-200=0 201-250= 2 units 251-300=4 units 301-350= 6 units 351-400= 8 units 401-450= 10 units 451+ Call MD    . ipratropium (ATROVENT) 0.03 % nasal spray Place 2 sprays into both nostrils 3 (three) times daily. Before meals    . ipratropium-albuterol (DUONEB) 0.5-2.5 (3) MG/3ML SOLN Take 3 mLs by nebulization. 3 ml inhale orally every 6 hours as needed for SOB/wheezing    . lactulose (CHRONULAC) 10 GM/15ML solution Take 10 g by mouth daily as needed for mild constipation.    Marland Kitchen levothyroxine (SYNTHROID, LEVOTHROID) 75 MCG tablet TAKE (1) TABLET BY MOUTH ONCE DAILY BEFORE BREAKFAST. 30 tablet 5  .  loperamide (IMODIUM A-D) 2 MG tablet Take 2 mg by mouth 3 (three) times daily as needed for diarrhea or loose stools.     . lubiprostone (AMITIZA) 24 MCG capsule Take 1 capsule (24 mcg total) by mouth 2 (two) times daily with a meal. 60 capsule 5  . magnesium hydroxide (MILK OF MAGNESIA) 400 MG/5ML suspension Take 30 mLs by mouth at bedtime as needed for mild constipation.     . Menthol, Topical Analgesic, (BIOFREEZE) 4 % GEL Apply topically 2 (two) times daily. Apply to lower back topically 2 times a day for back pain.    . metFORMIN (GLUCOPHAGE) 500 MG tablet Take by mouth 2 (two) times daily with a meal.    . metoprolol succinate (TOPROL-XL) 50 MG 24 hr tablet Take 50 mg by mouth in the morning. Take with or immediately following a meal.    . Multiple Vitamin (MULTIVITAMIN) tablet Take 1 tablet by mouth daily.    Marland Kitchen omega-3 acid ethyl esters (LOVAZA) 1 g capsule Take 1 g by mouth daily.    . pantoprazole (PROTONIX) 40 MG tablet Take 40 mg by mouth daily before breakfast.     . Polyethyl Glycol-Propyl Glycol (SYSTANE) 0.4-0.3 % SOLN Apply to eye as needed.    . polyethylene glycol (MIRALAX / GLYCOLAX) packet Take 17 g by mouth daily as needed for moderate constipation. 14 each 0  . potassium chloride SA (K-DUR,KLOR-CON) 20 MEQ tablet Take 20 mEq by mouth daily.    . primidone (MYSOLINE) 50 MG tablet Take 50 mg by mouth in the morning.     Marland Kitchen QUEtiapine (SEROQUEL) 100 MG tablet Take 100 mg by mouth at bedtime.    . senna (SENOKOT) 8.6 MG TABS tablet Take 2 tablets by mouth daily as needed for mild constipation.    . sertraline (ZOLOFT) 50 MG tablet Take 50 mg by mouth daily.    . sitaGLIPtin (JANUVIA) 100 MG tablet Take 100 mg by mouth daily.    . sodium chloride (OCEAN) 0.65 % nasal spray Place 2 sprays into the nose as needed (Rhinitis). Both nostrils    . sodium chloride 1 g tablet Take 1 g by mouth 2 (two) times daily.    Minus [place] Tosylate 40 MG CAPS Take 40 mg by mouth daily.    Marland Kitchen zinc  gluconate 50 MG tablet Take 50 mg by mouth daily.     No current facility-administered medications for this visit.    Past Medical History:  Diagnosis Date  . Allergic rhinitis    uses Nasonex daily as needed  . Allergy to angiotensin receptor blockers (ARB) 01/22/2014   Angioedema  . Anxiety    takes Ativan daily as needed  . Aspiration pneumonia ([place])   .  Asthma    Albuterol inhaler and Neb daily as needed  . 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 [name], residual palpitations  . Bipolar 1 disorder ([place])   . Chronic back pain    reason unknown  . Constipation   . COPD (chronic obstructive pulmonary disease) ([place])   . DDD (degenerative disc disease), lumbar   . Depression    takes Zoloft daily as well as Cymbalta  . Diabetes mellitus    Type II  . Dysphagia   . Dysrhythmia    SVT ==ablation done by Dr. Rayann Heman 2007  . Esophageal reflux    takes Zantac daily  . Frequent headaches   . History of bronchitis Dec 2014  . Hypertension    takes Losartan daily  . Hypothyroidism    takes Synthroid daily  . Joint swelling    left ankle  . Low sodium syndrome   . Lumbar radiculopathy   . Seizures ([place]) 04-05-13   one time ran out of Primidone and had stopped it cold [place]  . SVT (supraventricular tachycardia) ([place])   . Tremor, essential    takes Mysoline and Neurontin daily.  . Vocal cord dysfunction   . Weakness    in left arm    Past Surgical History:  Procedure Laterality Date  . ABDOMINAL HYSTERECTOMY    . ABLATION FOR AVNRT    . APPENDECTOMY    . BARTHOLIN GLAND CYST EXCISION     x 2  . BIOPSY  03/04/2015   Procedure: BIOPSY (Duodenal, Gastric);  Surgeon: Daneil Dolin, MD;  Location: [facility];  Service: Endoscopy;;  . BIOPSY  12/19/2018   Procedure: BIOPSY;  Surgeon: Daneil Dolin, MD;  [facility];  Service: Endoscopy;;  Gastric Biopsies    . BREAST SURGERY    . CESAREAN SECTION    .  CHOLECYSTECTOMY    . COLONOSCOPY  01/16/2004   [id number] rectum/colon  . COLONOSCOPY WITH PROPOFOL N/A 03/04/2015   Dr.[name]- internal hemorrhoids, pancolonic diverticulosis, colonic polyp= tubular adenoma  . DEEP BRAIN STIMULATOR PLACEMENT    . DENTAL SURGERY    . ESOPHAGEAL DILATION N/A 03/04/2015   Procedure: ESOPHAGEAL DILATION WITH 54FR MALONEY DILATOR;  Surgeon: Daneil Dolin, MD;  Location: [facility];  Service: Endoscopy;  Laterality: N/A;  . ESOPHAGEAL DILATION  12/19/2018   Procedure: ESOPHAGEAL DILATION;  Surgeon: Daneil Dolin, MD;  [facility];  Service: Endoscopy;;  . ESOPHAGOGASTRODUODENOSCOPY (EGD) WITH ESOPHAGEAL DILATION  04/03/2002   LOV:[id number]'J ring, otherwise normal esophagus, status post dilation with 56 F/Normal stomach  . ESOPHAGOGASTRODUODENOSCOPY (EGD) WITH ESOPHAGEAL DILATION N/A 11/08/2012   Dr. [name]:schatzki's ring s/p dilation/hiatal hernia  . ESOPHAGOGASTRODUODENOSCOPY (EGD) WITH PROPOFOL N/A 03/04/2015   Dr.[name]- schatzki's ring, hiatal hernia, scattered erosions bx= chronic inactive gastritis  . ESOPHAGOGASTRODUODENOSCOPY (EGD) WITH PROPOFOL N/A 05/06/2017   Normal esophagus. Dilated. Small hiatal hernia. Normal duodenal bulb and second portion of duodenum.  . ESOPHAGOGASTRODUODENOSCOPY (EGD) WITH PROPOFOL N/A 12/19/2018   [name]: Mild [name] ring status post dilation, multiple erosions in the stomach, biopsies negative.  Marland Kitchen FINGER SURGERY    . HEMORRHOID BANDING  2017   Dr.[name]  . LEFT ANKLE LIGAMENT REPAIR     x 2  . LEFT ELBOW REPAIR    . MALONEY DILATION N/A 05/06/2017   Procedure: Venia Minks DILATION;  Surgeon: Daneil Dolin, MD;  [facility];  Service: Endoscopy;  Laterality: N/A;  .  MALONEY DILATION N/A 12/19/2018   Procedure: Venia Minks DILATION;  Surgeon: Daneil Dolin, MD;  [facility];  Service: Endoscopy;  Laterality: N/A;  . MAXIMUM ACCESS (MAS)POSTERIOR LUMBAR INTERBODY FUSION (PLIF) 1 LEVEL N/A 04/24/2014    Procedure: Lumbar four-five Maximum access Surgery posterior lumbar interbody fusion;  Surgeon: [name], MD;  Location: [place] [facility];  Service: Neurosurgery;  Laterality: N/A;  Lumbar four-five Maximum access Surgery posterior lumbar interbody fusion  . MULTIPLE EXTRACTIONS WITH ALVEOLOPLASTY N/A 06/18/2017   Procedure: MULTIPLE EXTRACTION;  Surgeon: Diona Browner, DDS;  Location: [place];  Service: Oral Surgery;  Laterality: N/A;  . PARTIAL HYSTERECTOMY    . POLYPECTOMY  03/04/2015   Procedure: POLYPECTOMY (descending colon);  Surgeon: Daneil Dolin, MD;  Location[facility];  Service: Endoscopy;;  . PULSE GENERATOR IMPLANT Bilateral 12/15/2013   Procedure: BILATERAL PULSE GENERATOR IMPLANT;  Surgeon: [name], MD;  Location: [place] [facility];  Service: Neurosurgery;  Laterality: Bilateral;  BILATERAL  . RIGHT BREAST CYST     benign  . SUBTHALAMIC STIMULATOR BATTERY REPLACEMENT Bilateral 06/24/2018   Procedure: Bilateral implantable pulse generator battery change;  Surgeon: [name], MD;  Location: [place];  Service: Neurosurgery;  Laterality: Bilateral;  bilateral  . SUBTHALAMIC STIMULATOR INSERTION Bilateral 12/04/2013   Procedure: Bilateral Deep brain stimulator placement;  Surgeon: [name], MD;  Location: [place] [facility];  Service: Neurosurgery;  Laterality: Bilateral;  Bilateral Deep brain stimulator placement  . TONSILLECTOMY      Social History   Socioeconomic History  . Marital status: Legally Separated    Spouse name: Not on file  . Number of children: Not on file  . Years of education: Not on file  . Highest education level: Bachelor's degree (e.g., BA, AB, BS)  Occupational History  . 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 Smoking status: Former Smoker    Packs/day: 0.30    Years: 10.00    Pack years: 3.00    Types: Cigarettes    Quit date: 04/23/1993    Years since quitting: 27.2  . Smokeless tobacco: Never Used   . Tobacco comment: quit smoking 25+yrs ago  Vaping Use  . Vaping Use: Never used  Substance and Sexual Activity  . Alcohol use: No    Alcohol/week: 0.0 standard drinks  . Drug use: No  . Sexual activity: Not Currently  Other Topics Concern  . Not on file  Social History Narrative   Married, 1 daughte, living.  Lives with husband in [place], [place].   1 child died brain tumor at age 32.   Two grandchildren.   Works at [employer], patient currently lost her job.   Retired 2011.   No tobacco.   Alcohol: none in 30 yrs.  Distant history of heavy use.   No drug use.   Social Determinants of Health   Financial Resource Strain: Not on file  Food Insecurity: Not on file  Transportation Needs: Not on file  Physical Activity: Not on file  Stress: Not on file  Social Connections: Not on file  Intimate Partner Violence: Not on file   [place], LPN was present throughout the entirety of the encounter.  There were no vitals filed for this visit.  Wt Readings from Last 3 Encounters:  07/01/20 85.3 kg  05/14/20 85.3 kg  02/01/20 84.5 kg     PHYSICAL EXAM Affect appropriate Chronically ill  HEENT: normal  Neck supple with no adenopathy JVP normal no bruits no thyromegaly Lungs clear with no wheezing and good diaphragmatic motion Heart:  S1/S2 no murmur, no rub, gallop or click PMI normal stimulator battery for Neuro under left clavicle  Abdomen: benighn, BS positve, no tenderness, no AAA no bruit.  No HSM or HJR Distal pulses intact with no bruits No edema UE and head course tremors  Skin warm and dry No muscular weakness   ECG:  08/29/19 poor quality SR normal rate 81 bpm 07/17/2020 Artifact but appears NSR    Labs: Lab Results  Component Value Date/Time   K 3.9 09/21/2019 12:00 AM   BUN 12 09/21/2019 12:00 AM   CREATININE 0.5 09/21/2019 12:00 AM   CREATININE 0.73 09/15/2019 03:46 AM   CREATININE 0.70 12/27/2012 10:06 AM   ALT 10 09/21/2019 12:00 AM    TSH 0.72 09/19/2019 12:00 AM   TSH 1.01 05/21/2015 11:57 AM   HGB 11.8 (A) 09/21/2019 12:00 AM     Lipids: Lab Results  Component Value Date/Time   LDLCALC 80 02/13/2015 02:55 PM   CHOL 180 02/13/2015 02:55 PM   TRIG 175.0 (H) 02/13/2015 02:55 PM   HDL 64.80 02/13/2015 02:55 PM       ASSESSMENT AND PLAN:  1.  Tachycardia and palpitations: S/p AVNRT ablation in 2009 Continue Toprol stable   2. Essential HTN: Well controlled.  Continue current medications and low sodium Dash type diet.    3.  Bilateral leg edema: chronic venous insufficiency stable   4. DM:  Discussed low carb diet.  Target hemoglobin A1c is 6.5 or less.  Continue current medications.  5. Asthma:  F/u Dr Annamaria Boots risk of aspiration inhalers no need for oxygen  6. GERD:  [name] ring f/u GI continue protonix also has constipation   7. Tremor:  Deep brain stimulator Mysoline and other psychoacitve drugs include Zoloft, seroquel Has f/u with Dr Tat  8. Thyroid:  On synthroid labs with primary      Disposition: Follow up 1 yr   Jenkins Rouge MD [facility]

## 2020-07-10 ENCOUNTER — Telehealth: Payer: Self-pay | Admitting: Internal Medicine

## 2020-07-10 NOTE — Telephone Encounter (Signed)
[phone] ask for tiffany at [facility]     Needs to speak to nurse about patient Amiteza

## 2020-07-10 NOTE — Telephone Encounter (Signed)
Spoke with [name]. Pt is taking Amitiza 24 mcg twice daily. [name] reprots that the nurse practitioner put the Amitiza on hold due to pt having too many loose stools. They aren't sure if [name], PA would like to reduce the dosage.

## 2020-07-10 NOTE — Telephone Encounter (Signed)
May leave for [facility] for tomorrow.

## 2020-07-10 NOTE — Telephone Encounter (Signed)
Noted  

## 2020-07-15 NOTE — Telephone Encounter (Signed)
Noted. Will schedule patient once we receive Dr. Roseanne Kaufman schedule

## 2020-07-15 NOTE — Telephone Encounter (Addendum)
She previously did not have good control of constipation on Amitiza 36mcg BID so we increased to 59mcg BID.  1. Let's have nursing home change order to Amitiza 58mcg once daily (Hold for 24 hours if more then 3 stools per day).  2. Schedule for colonoscopy (h/o adenomatous colon polyps). With [name]. With propofol. ASA IV. Patient was agreeable at time of OV. 3. Day of prep: lantus 7 units at bedtime (hold if blood glucose less than 160). Metformin 250mg  BID. Januvia 50mg  daily. 4. AM of TCS: hold metformin and januvia.  5. DO NOT GIVE MIRALAX PREP. 6. Please note on colonoscopy order that pt has deep brain stimulator and pulse generator implant.

## 2020-07-16 NOTE — Telephone Encounter (Signed)
Faxing new order for Amitiza to nursing facility at [phone]. The facility is aware that order will be faxed.

## 2020-07-17 ENCOUNTER — Other Ambulatory Visit: Payer: Self-pay

## 2020-07-17 ENCOUNTER — Encounter: Payer: Self-pay | Admitting: Cardiovascular Disease

## 2020-07-17 ENCOUNTER — Ambulatory Visit (INDEPENDENT_AMBULATORY_CARE_PROVIDER_SITE_OTHER): Payer: 59 | Admitting: Cardiovascular Disease

## 2020-07-17 VITALS — BP 106/66 | HR 75 | Ht 64.5 in | Wt 177.8 lb

## 2020-07-17 DIAGNOSIS — I10 Essential (primary) hypertension: Secondary | ICD-10-CM

## 2020-07-17 DIAGNOSIS — I471 Supraventricular tachycardia: Secondary | ICD-10-CM

## 2020-07-17 NOTE — Addendum Note (Signed)
Addended by: Levonne Hubert on: 07/17/2020 05:22 PM   Modules accepted: Orders

## 2020-07-17 NOTE — Patient Instructions (Signed)
Medication Instructions:  Your physician recommends that you continue on your current medications as directed. Please refer to the Current Medication list given to you today.  *If you need a refill on your cardiac medications before your next appointment, please call your pharmacy*   Lab Work: None If you have labs (blood work) drawn today and your tests are completely normal, you will receive your results only by: Marland Kitchen MyChart Message (if you have MyChart) OR . A paper copy in the mail If you have any lab test that is abnormal or we need to change your treatment, we will call you to review the results.   Testing/Procedures: None   Follow-Up: At [facility], you and your health needs are our priority.  As part of our continuing mission to provide you with exceptional heart care, we have created designated Provider Care Teams.  These Care Teams include your primary Cardiologist (physician) and Advanced Practice Providers (APPs -  Physician Assistants and Nurse Practitioners) who all work together to provide you with the care you need, when you need it.  We recommend signing up for the patient portal called "MyChart".  Sign up information is provided on this After Visit Summary.  MyChart is used to connect with patients for Virtual Visits (Telemedicine).  Patients are able to view lab/test results, encounter notes, upcoming appointments, etc.  Non-urgent messages can be sent to your provider as well.   To learn more about what you can do with MyChart, go to [email or web address].    Your next appointment:   12 month(s)  The format for your next appointment:   In Person  Provider:   Jenkins Rouge, MD   Other Instructions None

## 2020-07-22 ENCOUNTER — Telehealth: Payer: Self-pay | Admitting: *Deleted

## 2020-07-22 MED ORDER — PEG 3350-KCL-NA BICARB-NACL 420 G PO SOLR
ORAL | 0 refills | Status: DC
Start: 2020-07-22 — End: 2020-11-19

## 2020-07-22 NOTE — Telephone Encounter (Signed)
Called [facility] and spoke with Claiborne Billings. TCS with propofol, asa 3 scheduled for 6/30 at 11:30am. Aware will need to arrive 3 hrs prior for rapid covid test appt. Fax# to fax instructions [phone]

## 2020-07-23 ENCOUNTER — Encounter: Payer: Self-pay | Admitting: *Deleted

## 2020-09-02 ENCOUNTER — Other Ambulatory Visit: Payer: Self-pay

## 2020-09-02 ENCOUNTER — Emergency Department (HOSPITAL_COMMUNITY): Payer: 59

## 2020-09-02 ENCOUNTER — Inpatient Hospital Stay (HOSPITAL_COMMUNITY)
Admission: EM | Admit: 2020-09-02 | Discharge: 2020-09-06 | DRG: 871 | Disposition: A | Discharge status: Skilled Nursing Facility | Payer: 59 | Attending: Internal Medicine | Admitting: Internal Medicine

## 2020-09-02 ENCOUNTER — Encounter (HOSPITAL_COMMUNITY): Payer: Self-pay | Admitting: Emergency Medicine

## 2020-09-02 DIAGNOSIS — E118 Type 2 diabetes mellitus with unspecified complications: Secondary | ICD-10-CM | POA: Diagnosis not present

## 2020-09-02 DIAGNOSIS — J440 Chronic obstructive pulmonary disease with acute lower respiratory infection: Secondary | ICD-10-CM | POA: Diagnosis present

## 2020-09-02 DIAGNOSIS — Z825 Family history of asthma and other chronic lower respiratory diseases: Secondary | ICD-10-CM

## 2020-09-02 DIAGNOSIS — I10 Essential (primary) hypertension: Secondary | ICD-10-CM | POA: Diagnosis not present

## 2020-09-02 DIAGNOSIS — R509 Fever, unspecified: Secondary | ICD-10-CM

## 2020-09-02 DIAGNOSIS — Z79890 Hormone replacement therapy (postmenopausal): Secondary | ICD-10-CM

## 2020-09-02 DIAGNOSIS — Z8249 Family history of ischemic heart disease and other diseases of the circulatory system: Secondary | ICD-10-CM

## 2020-09-02 DIAGNOSIS — R569 Unspecified convulsions: Secondary | ICD-10-CM | POA: Diagnosis present

## 2020-09-02 DIAGNOSIS — Z87891 Personal history of nicotine dependence: Secondary | ICD-10-CM

## 2020-09-02 DIAGNOSIS — G8929 Other chronic pain: Secondary | ICD-10-CM | POA: Diagnosis present

## 2020-09-02 DIAGNOSIS — D696 Thrombocytopenia, unspecified: Secondary | ICD-10-CM | POA: Diagnosis not present

## 2020-09-02 DIAGNOSIS — G4733 Obstructive sleep apnea (adult) (pediatric): Secondary | ICD-10-CM | POA: Diagnosis present

## 2020-09-02 DIAGNOSIS — Z818 Family history of other mental and behavioral disorders: Secondary | ICD-10-CM

## 2020-09-02 DIAGNOSIS — Z20822 Contact with and (suspected) exposure to covid-19: Secondary | ICD-10-CM | POA: Diagnosis present

## 2020-09-02 DIAGNOSIS — K219 Gastro-esophageal reflux disease without esophagitis: Secondary | ICD-10-CM | POA: Diagnosis present

## 2020-09-02 DIAGNOSIS — J309 Allergic rhinitis, unspecified: Secondary | ICD-10-CM | POA: Diagnosis present

## 2020-09-02 DIAGNOSIS — J189 Pneumonia, unspecified organism: Secondary | ICD-10-CM | POA: Diagnosis not present

## 2020-09-02 DIAGNOSIS — Z90710 Acquired absence of both cervix and uterus: Secondary | ICD-10-CM

## 2020-09-02 DIAGNOSIS — Z8701 Personal history of pneumonia (recurrent): Secondary | ICD-10-CM

## 2020-09-02 DIAGNOSIS — F419 Anxiety disorder, unspecified: Secondary | ICD-10-CM | POA: Diagnosis present

## 2020-09-02 DIAGNOSIS — J383 Other diseases of vocal cords: Secondary | ICD-10-CM | POA: Diagnosis present

## 2020-09-02 DIAGNOSIS — J9611 Chronic respiratory failure with hypoxia: Secondary | ICD-10-CM | POA: Diagnosis present

## 2020-09-02 DIAGNOSIS — F319 Bipolar disorder, unspecified: Secondary | ICD-10-CM | POA: Diagnosis present

## 2020-09-02 DIAGNOSIS — E876 Hypokalemia: Secondary | ICD-10-CM | POA: Diagnosis present

## 2020-09-02 DIAGNOSIS — R0602 Shortness of breath: Secondary | ICD-10-CM

## 2020-09-02 DIAGNOSIS — Z794 Long term (current) use of insulin: Secondary | ICD-10-CM

## 2020-09-02 DIAGNOSIS — A419 Sepsis, unspecified organism: Principal | ICD-10-CM | POA: Diagnosis present

## 2020-09-02 DIAGNOSIS — Z7982 Long term (current) use of aspirin: Secondary | ICD-10-CM

## 2020-09-02 DIAGNOSIS — G250 Essential tremor: Secondary | ICD-10-CM | POA: Diagnosis present

## 2020-09-02 DIAGNOSIS — Z888 Allergy status to other drugs, medicaments and biological substances status: Secondary | ICD-10-CM

## 2020-09-02 DIAGNOSIS — K2289 Other specified disease of esophagus: Secondary | ICD-10-CM | POA: Diagnosis present

## 2020-09-02 DIAGNOSIS — M549 Dorsalgia, unspecified: Secondary | ICD-10-CM | POA: Diagnosis present

## 2020-09-02 DIAGNOSIS — Z90711 Acquired absence of uterus with remaining cervical stump: Secondary | ICD-10-CM

## 2020-09-02 DIAGNOSIS — Z9981 Dependence on supplemental oxygen: Secondary | ICD-10-CM

## 2020-09-02 DIAGNOSIS — E039 Hypothyroidism, unspecified: Secondary | ICD-10-CM | POA: Diagnosis present

## 2020-09-02 DIAGNOSIS — K5730 Diverticulosis of large intestine without perforation or abscess without bleeding: Secondary | ICD-10-CM | POA: Diagnosis present

## 2020-09-02 DIAGNOSIS — E1165 Type 2 diabetes mellitus with hyperglycemia: Secondary | ICD-10-CM | POA: Diagnosis present

## 2020-09-02 DIAGNOSIS — J4540 Moderate persistent asthma, uncomplicated: Secondary | ICD-10-CM | POA: Diagnosis present

## 2020-09-02 DIAGNOSIS — R000 Tachycardia, unspecified: Secondary | ICD-10-CM | POA: Diagnosis not present

## 2020-09-02 DIAGNOSIS — E038 Other specified hypothyroidism: Secondary | ICD-10-CM | POA: Diagnosis not present

## 2020-09-02 DIAGNOSIS — Z885 Allergy status to narcotic agent status: Secondary | ICD-10-CM

## 2020-09-02 DIAGNOSIS — Z7984 Long term (current) use of oral hypoglycemic drugs: Secondary | ICD-10-CM

## 2020-09-02 DIAGNOSIS — Z882 Allergy status to sulfonamides status: Secondary | ICD-10-CM

## 2020-09-02 DIAGNOSIS — Z981 Arthrodesis status: Secondary | ICD-10-CM

## 2020-09-02 DIAGNOSIS — Z7951 Long term (current) use of inhaled steroids: Secondary | ICD-10-CM

## 2020-09-02 DIAGNOSIS — Z9682 Presence of neurostimulator: Secondary | ICD-10-CM

## 2020-09-02 DIAGNOSIS — Z79899 Other long term (current) drug therapy: Secondary | ICD-10-CM

## 2020-09-02 DIAGNOSIS — Z91040 Latex allergy status: Secondary | ICD-10-CM

## 2020-09-02 LAB — CBC WITH DIFFERENTIAL/PLATELET
Abs Immature Granulocytes: 0.06 10*3/uL (ref 0.00–0.07)
Basophils Absolute: 0.1 10*3/uL (ref 0.0–0.1)
Basophils Relative: 1 %
Eosinophils Absolute: 0 10*3/uL (ref 0.0–0.5)
Eosinophils Relative: 0 %
HCT: 32.7 % — ABNORMAL LOW (ref 36.0–46.0)
Hemoglobin: 11.1 g/dL — ABNORMAL LOW (ref 12.0–15.0)
Immature Granulocytes: 1 %
Lymphocytes Relative: 12 %
Lymphs Abs: 1.1 10*3/uL (ref 0.7–4.0)
MCH: 37.4 pg — ABNORMAL HIGH (ref 26.0–34.0)
MCHC: 33.9 g/dL (ref 30.0–36.0)
MCV: 110.1 fL — ABNORMAL HIGH (ref 80.0–100.0)
Monocytes Absolute: 0.6 10*3/uL (ref 0.1–1.0)
Monocytes Relative: 6 %
Neutro Abs: 7.3 10*3/uL (ref 1.7–7.7)
Neutrophils Relative %: 80 %
Platelets: 83 10*3/uL — ABNORMAL LOW (ref 150–400)
RBC: 2.97 MIL/uL — ABNORMAL LOW (ref 3.87–5.11)
RDW: 13.5 % (ref 11.5–15.5)
WBC: 9.1 10*3/uL (ref 4.0–10.5)
nRBC: 0 % (ref 0.0–0.2)

## 2020-09-02 LAB — URINALYSIS, ROUTINE W REFLEX MICROSCOPIC
Bilirubin Urine: NEGATIVE
Glucose, UA: >=500 mg/dL — AB
Hgb urine dipstick: NEGATIVE
Ketones, ur: 80 mg/dL — AB
Leukocytes,Ua: NEGATIVE
Nitrite: NEGATIVE
Protein, ur: 100 mg/dL — AB
Specific Gravity, Urine: 1.024 (ref 1.005–1.030)
pH: 5 (ref 5.0–8.0)

## 2020-09-02 LAB — RESP PANEL BY RT-PCR (FLU A&B, COVID) ARPGX2
Influenza A by PCR: NEGATIVE
Influenza B by PCR: NEGATIVE
SARS Coronavirus 2 by RT PCR: NEGATIVE

## 2020-09-02 LAB — COMPREHENSIVE METABOLIC PANEL
ALT: 9 U/L (ref 0–44)
AST: 12 U/L — ABNORMAL LOW (ref 15–41)
Albumin: 3.1 g/dL — ABNORMAL LOW (ref 3.5–5.0)
Alkaline Phosphatase: 45 U/L (ref 38–126)
Anion gap: 12 (ref 5–15)
BUN: 10 mg/dL (ref 8–23)
CO2: 23 mmol/L (ref 22–32)
Calcium: 8.7 mg/dL — ABNORMAL LOW (ref 8.9–10.3)
Chloride: 99 mmol/L (ref 98–111)
Creatinine, Ser: 0.43 mg/dL — ABNORMAL LOW (ref 0.44–1.00)
GFR, Estimated: >60 mL/min (ref >60)
Glucose, Bld: 211 mg/dL — ABNORMAL HIGH (ref 70–99)
Potassium: 3.2 mmol/L — ABNORMAL LOW (ref 3.5–5.1)
Sodium: 134 mmol/L — ABNORMAL LOW (ref 135–145)
Total Bilirubin: 1.6 mg/dL — ABNORMAL HIGH (ref 0.3–1.2)
Total Protein: 6.7 g/dL (ref 6.5–8.1)

## 2020-09-02 LAB — TROPONIN I (HIGH SENSITIVITY)
Troponin I (High Sensitivity): 5 ng/L (ref <18)
Troponin I (High Sensitivity): 5 ng/L (ref <18)

## 2020-09-02 LAB — MAGNESIUM: Magnesium: 1.6 mg/dL — ABNORMAL LOW (ref 1.7–2.4)

## 2020-09-02 LAB — LACTIC ACID, PLASMA: Lactic Acid, Venous: 1 mmol/L (ref 0.5–1.9)

## 2020-09-02 LAB — LIPASE, BLOOD: Lipase: 22 U/L (ref 11–51)

## 2020-09-02 LAB — CBG MONITORING, ED: Glucose-Capillary: 182 mg/dL — ABNORMAL HIGH (ref 70–99)

## 2020-09-02 MED ORDER — SODIUM CHLORIDE 0.9 % IV BOLUS
1000.0000 mL | Freq: Once | INTRAVENOUS | Status: AC
  Administered 2020-09-02: 1000 mL via INTRAVENOUS

## 2020-09-02 MED ORDER — ACETAMINOPHEN 325 MG PO TABS: 650.0000 mg | ORAL_TABLET | Freq: Four times a day (QID) | ORAL | Status: DC | PRN

## 2020-09-02 MED ORDER — FLUTICASONE FUROATE-VILANTEROL 100-25 MCG/INH IN AEPB
1.0000 | INHALATION_SPRAY | Freq: Every day | RESPIRATORY_TRACT | Status: DC
  Administered 2020-09-03 – 2020-09-06 (×4): 1 via RESPIRATORY_TRACT
  Filled 2020-09-02: qty 28

## 2020-09-02 MED ORDER — VALPROATE SODIUM 500 MG/5ML IV SOLN
INTRAVENOUS | Status: AC
  Filled 2020-09-02: qty 5

## 2020-09-02 MED ORDER — MAGNESIUM SULFATE 2 GM/50ML IV SOLN
2.0000 g | Freq: Once | INTRAVENOUS | Status: AC
  Administered 2020-09-02: 2 g via INTRAVENOUS
  Filled 2020-09-02: qty 50

## 2020-09-02 MED ORDER — VALPROATE SODIUM 100 MG/ML IV SOLN
500.0000 mg | Freq: Three times a day (TID) | INTRAVENOUS | Status: DC
  Administered 2020-09-02 – 2020-09-06 (×12): 500 mg via INTRAVENOUS
  Filled 2020-09-02 (×21): qty 5

## 2020-09-02 MED ORDER — IPRATROPIUM-ALBUTEROL 0.5-2.5 (3) MG/3ML IN SOLN
3.0000 mL | Freq: Four times a day (QID) | RESPIRATORY_TRACT | Status: DC
  Filled 2020-09-02 (×2): qty 3

## 2020-09-02 MED ORDER — INSULIN ASPART 100 UNIT/ML IJ SOLN
0.0000 [IU] | Freq: Four times a day (QID) | INTRAMUSCULAR | Status: DC
  Administered 2020-09-02 – 2020-09-03 (×4): 2 [IU] via SUBCUTANEOUS
  Filled 2020-09-02: qty 1

## 2020-09-02 MED ORDER — SODIUM CHLORIDE 0.9 % IV SOLN
2.0000 g | Freq: Once | INTRAVENOUS | Status: AC
  Administered 2020-09-02: 2 g via INTRAVENOUS
  Filled 2020-09-02: qty 2

## 2020-09-02 MED ORDER — METRONIDAZOLE 500 MG/100ML IV SOLN
500.0000 mg | Freq: Once | INTRAVENOUS | Status: AC
  Administered 2020-09-02: 500 mg via INTRAVENOUS
  Filled 2020-09-02: qty 100

## 2020-09-02 MED ORDER — ONDANSETRON HCL 4 MG/2ML IJ SOLN: 4.0000 mg | Freq: Four times a day (QID) | INTRAMUSCULAR | Status: DC | PRN

## 2020-09-02 MED ORDER — POTASSIUM CHLORIDE IN NACL 40-0.9 MEQ/L-% IV SOLN
INTRAVENOUS | Status: AC
  Filled 2020-09-02 (×4): qty 1000

## 2020-09-02 MED ORDER — VANCOMYCIN HCL IN DEXTROSE 1-5 GM/200ML-% IV SOLN
1000.0000 mg | Freq: Once | INTRAVENOUS | Status: AC
  Administered 2020-09-02: 1000 mg via INTRAVENOUS
  Filled 2020-09-02: qty 200

## 2020-09-02 MED ORDER — ONDANSETRON HCL 4 MG PO TABS: 4.0000 mg | ORAL_TABLET | Freq: Four times a day (QID) | ORAL | Status: DC | PRN

## 2020-09-02 MED ORDER — ACETAMINOPHEN 500 MG PO TABS
1000.0000 mg | ORAL_TABLET | Freq: Once | ORAL | Status: AC
  Administered 2020-09-02: 1000 mg via ORAL
  Filled 2020-09-02: qty 2

## 2020-09-02 MED ORDER — ACETAMINOPHEN 650 MG RE SUPP: 650.0000 mg | Freq: Four times a day (QID) | RECTAL | Status: DC | PRN

## 2020-09-02 MED ORDER — LEVALBUTEROL HCL 0.63 MG/3ML IN NEBU
0.6300 mg | INHALATION_SOLUTION | Freq: Four times a day (QID) | RESPIRATORY_TRACT | Status: DC
  Administered 2020-09-03: 0.63 mg via RESPIRATORY_TRACT
  Filled 2020-09-02: qty 3

## 2020-09-02 MED ORDER — IPRATROPIUM-ALBUTEROL 0.5-2.5 (3) MG/3ML IN SOLN
3.0000 mL | RESPIRATORY_TRACT | Status: DC | PRN
  Administered 2020-09-02 – 2020-09-03 (×2): 3 mL via RESPIRATORY_TRACT

## 2020-09-02 MED ORDER — SODIUM CHLORIDE 0.9 % IV SOLN
3.0000 g | Freq: Four times a day (QID) | INTRAVENOUS | Status: DC
  Administered 2020-09-02 – 2020-09-06 (×16): 3 g via INTRAVENOUS
  Filled 2020-09-02 (×16): qty 8
  Filled 2020-09-02: qty 3
  Filled 2020-09-02 (×2): qty 8
  Filled 2020-09-02: qty 3
  Filled 2020-09-02 (×9): qty 8

## 2020-09-02 MED ORDER — LACTATED RINGERS IV SOLN: INTRAVENOUS | Status: DC

## 2020-09-02 MED ORDER — SODIUM CHLORIDE 0.9 % IV SOLN
500.0000 mg | INTRAVENOUS | Status: DC
  Administered 2020-09-02 – 2020-09-05 (×4): 500 mg via INTRAVENOUS
  Filled 2020-09-02 (×4): qty 500

## 2020-09-02 MED ORDER — VANCOMYCIN HCL IN DEXTROSE 1-5 GM/200ML-% IV SOLN: 1000.0000 mg | Freq: Two times a day (BID) | INTRAVENOUS | Status: DC

## 2020-09-02 MED ORDER — SODIUM CHLORIDE 0.9 % IV SOLN: 2.0000 g | Freq: Three times a day (TID) | INTRAVENOUS | Status: DC

## 2020-09-02 NOTE — ED Notes (Signed)
EDP at bedside to complete MSE 

## 2020-09-02 NOTE — ED Triage Notes (Signed)
Pt from [facility] for SOB with productive cough; reports yellow sputum; pt has had 2 negative ch xrays; normally on 2L [place]; sats 90s on 4L 

## 2020-09-02 NOTE — ED Provider Notes (Signed)
Emergency Department Provider Note   I have reviewed the triage vital signs and the nursing notes.   HISTORY  Chief Complaint Shortness of Breath   HPI [name] is a 70 y.o. female with past medical history reviewed below including COPD, prior aspiration pneumonia, and vocal cord dysfunction  presents to the emergency department by EMS from her nursing facility with cough, shortness of breath, loud respirations.  No report from staff of recent vomiting.  Patient describes shortness of breath but denies pain in her chest or abdomen.  Patient is on 2 L of nasal cannula oxygen at baseline but when EMS arrived patient was hypoxic on this.  They increased it to 4 L and her sats improved.  Staff report the patient has been coughing up yellow sputum.  They had apparently performed x-rays which are described as normal.  Patient also apparently negative by rapid COVID test at the facility. No radiation of symptoms or modifying factors.   Level 5 caveat: Vocal cord dysfunction    Past Medical History:  Diagnosis Date  . Allergic rhinitis    uses Nasonex daily as needed  . Allergy to angiotensin receptor blockers (ARB) 01/22/2014   Angioedema  . Anxiety    takes Ativan daily as needed  . Aspiration pneumonia ([place])   . Asthma    Albuterol inhaler and Neb daily as needed  . 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 [name], residual palpitations  . Bipolar 1 disorder ([place])   . Chronic back pain    reason unknown  . Constipation   . COPD (chronic obstructive pulmonary disease) ([place])   . DDD (degenerative disc disease), lumbar   . Depression    takes Zoloft daily as well as Cymbalta  . Diabetes mellitus    Type II  . Dysphagia   . Dysrhythmia    SVT ==ablation done by Dr. Rayann Heman 2007  . Esophageal reflux    takes Zantac daily  . Frequent headaches   . History of bronchitis Dec 2014  . Hypertension    takes Losartan daily  .  Hypothyroidism    takes Synthroid daily  . Joint swelling    left ankle  . Low sodium syndrome   . Lumbar radiculopathy   . Seizures ([place]) 04-05-13   one time ran out of Primidone and had stopped it cold [place]  . SVT (supraventricular tachycardia) ([place])   . Tremor, essential    takes Mysoline and Neurontin daily.  . Vocal cord dysfunction   . Weakness    in left arm    Patient Active Problem List   Diagnosis Date Noted  . Pneumonia 09/02/2020  . Anemia 05/14/2020  . Macrocytic anemia 09/19/2019  . Thrombocytopenia ([place]) 09/19/2019  . Chronic back pain   . Altered mental status 09/11/2019  . Opioid overdose ([place]) 09/11/2019  . Hypotension 09/11/2019  . Acute hypoxemic respiratory failure due to COVID-19 ([place]) 09/11/2019  . Uncomplicated asthma 15/17/6160  . Skin erythema 09/28/2018  . Umbilical pain 73/71/0626  . Constipation 03/18/2017  . CAP (community acquired pneumonia) 04/21/2016  . History of colonic polyps   . Diverticulosis of colon without hemorrhage   . Other hemorrhoids   . [name]'s ring   . Mucosal abnormality of stomach   . Dysphagia   . Encounter for screening colonoscopy 02/21/2015  . Upper airway cough syndrome 01/18/2015  . OAB (overactive bladder) 12/30/2014  . Incontinence in female  12/25/2014  . MDD (major depressive disorder), recurrent severe, without psychosis ([place]) 12/14/2014  . Thrush of mouth and esophagus ([place]) 11/23/2014  . Depression with somatization 11/05/2014  . Type II diabetes mellitus with manifestations ([place]) 10/23/2014  . Visit for screening mammogram 08/21/2014  . Hypokalemia 05/30/2014  . Tinea corporis 05/30/2014  . Lumbar scoliosis 04/24/2014  . Obstructive sleep apnea 04/23/2014  . Moderate persistent asthma with acute bronchitis and acute exacerbation 03/02/2014  . Allergy to angiotensin receptor blockers (ARB) 01/22/2014  . S/P deep brain stimulator placement 01/10/2014  . Greater trochanteric bursitis of left hip  11/07/2013  . Spinal stenosis of lumbar region 10/30/2013  . Right thyroid nodule 10/12/2013  . CMC arthritis, thumb, degenerative 06/15/2013  . GERD (gastroesophageal reflux disease) 11/03/2012  . IBS (irritable bowel syndrome) 11/03/2012  . Esophageal stenosis 11/01/2012  . Asthma, moderate persistent, poorly-controlled 09/01/2012  . Hypothyroidism 12/22/2011  . Hyperlipidemia with target LDL less than 100 06/22/2008  . Essential hypertension 09/04/2007  . Tremor, essential 04/19/2007  . Seasonal and perennial allergic rhinitis 04/19/2007    Past Surgical History:  Procedure Laterality Date  . ABDOMINAL HYSTERECTOMY    . ABLATION FOR AVNRT    . APPENDECTOMY    . BARTHOLIN GLAND CYST EXCISION     x 2  . BIOPSY  03/04/2015   Procedure: BIOPSY (Duodenal, Gastric);  Surgeon: Daneil Dolin, MD;  [facility]: [facility];  Service: Endoscopy;;  . BIOPSY  12/19/2018   Procedure: BIOPSY;  Surgeon: Daneil Dolin, MD;  [facility];  Service: Endoscopy;;  Gastric Biopsies    . BREAST SURGERY    . CESAREAN SECTION    . CHOLECYSTECTOMY    . COLONOSCOPY  01/16/2004   XFG:HWEXHB rectum/colon  . COLONOSCOPY WITH PROPOFOL N/A 03/04/2015   Dr.[name]- internal hemorrhoids, pancolonic diverticulosis, colonic polyp= tubular adenoma  . DEEP BRAIN STIMULATOR PLACEMENT    . DENTAL SURGERY    . ESOPHAGEAL DILATION N/A 03/04/2015   Procedure: ESOPHAGEAL DILATION WITH 54FR MALONEY DILATOR;  Surgeon: Daneil Dolin, MD;  Location: [facility];  Service: Endoscopy;  Laterality: N/A;  . ESOPHAGEAL DILATION  12/19/2018   Procedure: ESOPHAGEAL DILATION;  Surgeon: Daneil Dolin, MD;  [facility];  Service: Endoscopy;;  . ESOPHAGOGASTRODUODENOSCOPY (EGD) WITH ESOPHAGEAL DILATION  04/03/2002   ZJI:[id number]'O ring, otherwise normal esophagus, status post dilation with 56 F/Normal stomach  . ESOPHAGOGASTRODUODENOSCOPY (EGD) WITH ESOPHAGEAL DILATION N/A 11/08/2012   Dr. [name]:schatzki's ring s/p  dilation/hiatal hernia  . ESOPHAGOGASTRODUODENOSCOPY (EGD) WITH PROPOFOL N/A 03/04/2015   Dr.[name]- schatzki's ring, hiatal hernia, scattered erosions bx= chronic inactive gastritis  . ESOPHAGOGASTRODUODENOSCOPY (EGD) WITH PROPOFOL N/A 05/06/2017   Normal esophagus. Dilated. Small hiatal hernia. Normal duodenal bulb and second portion of duodenum.  . ESOPHAGOGASTRODUODENOSCOPY (EGD) WITH PROPOFOL N/A 12/19/2018   [name]: Mild [name] ring status post dilation, multiple erosions in the stomach, biopsies negative.  Marland Kitchen FINGER SURGERY    . HEMORRHOID BANDING  2017   Dr.[name]  . LEFT ANKLE LIGAMENT REPAIR     x 2  . LEFT ELBOW REPAIR    . MALONEY DILATION N/A 05/06/2017   Procedure: Venia Minks DILATION;  Surgeon: Daneil Dolin, MD;  [facility];  Service: Endoscopy;  Laterality: N/A;  . Venia Minks DILATION N/A 12/19/2018   Procedure: Venia Minks DILATION;  Surgeon: Daneil Dolin, MD;  [facility];  Service: Endoscopy;  Laterality: N/A;  . MAXIMUM ACCESS (MAS)POSTERIOR LUMBAR INTERBODY FUSION (PLIF) 1 LEVEL  N/A 04/24/2014   Procedure: Lumbar four-five Maximum access Surgery posterior lumbar interbody fusion;  Surgeon: [name], MD;  Location: [place] [facility];  Service: Neurosurgery;  Laterality: N/A;  Lumbar four-five Maximum access Surgery posterior lumbar interbody fusion  . MULTIPLE EXTRACTIONS WITH ALVEOLOPLASTY N/A 06/18/2017   Procedure: MULTIPLE EXTRACTION;  Surgeon: Diona Browner, DDS;  Location: [place];  Service: Oral Surgery;  Laterality: N/A;  . PARTIAL HYSTERECTOMY    . POLYPECTOMY  03/04/2015   Procedure: POLYPECTOMY (descending colon);  Surgeon: Daneil Dolin, MD;  [facility];  Service: Endoscopy;;  . PULSE GENERATOR IMPLANT Bilateral 12/15/2013   Procedure: BILATERAL PULSE GENERATOR IMPLANT;  Surgeon: [name], MD;  Location: [place] [facility];  Service: Neurosurgery;  Laterality: Bilateral;  BILATERAL  . RIGHT BREAST CYST     benign  . SUBTHALAMIC STIMULATOR  BATTERY REPLACEMENT Bilateral 06/24/2018   Procedure: Bilateral implantable pulse generator battery change;  Surgeon: [name], MD;  Location: [place];  Service: Neurosurgery;  Laterality: Bilateral;  bilateral  . SUBTHALAMIC STIMULATOR INSERTION Bilateral 12/04/2013   Procedure: Bilateral Deep brain stimulator placement;  Surgeon: [name], MD;  Location: [place] [facility];  Service: Neurosurgery;  Laterality: Bilateral;  Bilateral Deep brain stimulator placement  . TONSILLECTOMY      Allergies Invokana [canagliflozin], Losartan, Metformin and related, Ace inhibitors, Latex, Other, Oxycodone-acetaminophen, Sulfa antibiotics, and Mucinex [guaifenesin er]  Family History  Problem Relation Age of Onset  . Lung cancer Father        DIED AGE 70 LUNG CA  . Alcohol abuse Father   . Anxiety disorder Father   . Depression Father   . COPD Mother        DIED AGE 23,MRSA,COPD,PNEUMONIA  . Pneumonia Mother        DIED AGE 23,MRSA,COPD,PNEUMONIA  . Supraventricular tachycardia Mother   . Anxiety disorder Mother   . Depression Mother   . Alcohol abuse Mother   . Brain cancer Son        Died at age 60  . Heart disease Sister        DIED AGE 1, SMOKER,?HEART  . COPD Brother        AGE 81  . Colon cancer Neg Hx     Social History Social History   Tobacco Use  . Smoking status: Former Smoker    Packs/day: 0.30    Years: 10.00    Pack years: 3.00    Types: Cigarettes    Quit date: 04/23/1993    Years since quitting: 27.3  . Smokeless tobacco: Never Used  . Tobacco comment: quit smoking 25+yrs ago  Vaping Use  . Vaping Use: Never used  Substance Use Topics  . Alcohol use: No    Alcohol/week: 0.0 standard drinks  . Drug use: No    Review of Systems  Constitutional: No fever/chills Eyes: No visual changes. ENT: No sore throat. Cardiovascular: Denies chest pain. Respiratory: Positive shortness of breath and productive cough.  Gastrointestinal: No abdominal pain.  No nausea, no  vomiting.  No diarrhea.  No constipation. Genitourinary: Negative for dysuria. Musculoskeletal: Negative for back pain. Skin: Negative for rash. Neurological: Negative for headaches, focal weakness or numbness.  10-point ROS otherwise negative.  ____________________________________________   PHYSICAL EXAM:  VITAL SIGNS: ED Triage Vitals  Enc Vitals Group     BP 09/02/20 1100 (!) 106/45     Pulse Rate 09/02/20 1100 (!) 121     Resp 09/02/20 1100 (!) 22  Temp 09/02/20 1100 99 F (37.2 C)     Temp Source 09/02/20 1100 Oral     SpO2 09/02/20 1100 97 %     Weight 09/02/20 1107 177 lb 11.1 oz (80.6 kg)     Height 09/02/20 1107 5\' 4"  (1.626 m)   Constitutional: Alert and oriented. Well appearing and in no acute distress. Eyes: Conjunctivae are normal.  Head: Atraumatic. Nose: No congestion/rhinnorhea. Mouth/Throat: Mucous membranes are moist.  Neck: No stridor.  Cardiovascular: Tachycardia. Good peripheral circulation. Grossly normal heart sounds.   Respiratory: Increased respiratory effort w/ frequent cough.  No retractions. Lungs with rhonchi throughout, worse at the bases. No wheezing.  Gastrointestinal: Soft and nontender. No distention.  Musculoskeletal: No lower extremity tenderness w/ trace edema. No gross deformities of extremities. Neurologic:  Normal speech and language.  Skin:  Skin is warm, dry and intact. No rash noted.   ____________________________________________   LABS (all labs ordered are listed, but only abnormal results are displayed)  Labs Reviewed  MRSA PCR SCREENING - Abnormal; Notable for the following components:      Result Value   MRSA by PCR POSITIVE (*)    All other components within normal limits  COMPREHENSIVE METABOLIC PANEL - Abnormal; Notable for the following components:   Sodium 134 (*)    Potassium 3.2 (*)    Glucose, Bld 211 (*)    Creatinine, Ser 0.43 (*)    Calcium 8.7 (*)    Albumin 3.1 (*)    AST 12 (*)    Total  Bilirubin 1.6 (*)    All other components within normal limits  CBC WITH DIFFERENTIAL/PLATELET - Abnormal; Notable for the following components:   RBC 2.97 (*)    Hemoglobin 11.1 (*)    HCT 32.7 (*)    MCV 110.1 (*)    MCH 37.4 (*)    Platelets 83 (*)    All other components within normal limits  URINALYSIS, ROUTINE W REFLEX MICROSCOPIC - Abnormal; Notable for the following components:   APPearance HAZY (*)    Glucose, UA >=500 (*)    Ketones, ur 80 (*)    Protein, ur 100 (*)    Bacteria, UA RARE (*)    All other components within normal limits  MAGNESIUM - Abnormal; Notable for the following components:   Magnesium 1.6 (*)    All other components within normal limits  BASIC METABOLIC PANEL - Abnormal; Notable for the following components:   Potassium 3.4 (*)    Glucose, Bld 160 (*)    BUN 7 (*)    Calcium 8.1 (*)    All other components within normal limits  CBC - Abnormal; Notable for the following components:   RBC 2.61 (*)    Hemoglobin 9.6 (*)    HCT 29.8 (*)    MCV 114.2 (*)    MCH 36.8 (*)    Platelets 71 (*)    All other components within normal limits  GLUCOSE, CAPILLARY - Abnormal; Notable for the following components:   Glucose-Capillary 170 (*)    All other components within normal limits  GLUCOSE, CAPILLARY - Abnormal; Notable for the following components:   Glucose-Capillary 175 (*)    All other components within normal limits  GLUCOSE, CAPILLARY - Abnormal; Notable for the following components:   Glucose-Capillary 165 (*)    All other components within normal limits  COMPREHENSIVE METABOLIC PANEL - Abnormal; Notable for the following components:   Glucose, Bld 224 (*)    Creatinine,  Ser 0.38 (*)    Total Protein 6.0 (*)    Albumin 2.6 (*)    All other components within normal limits  CBC WITH DIFFERENTIAL/PLATELET - Abnormal; Notable for the following components:   RBC 2.39 (*)    Hemoglobin 8.7 (*)    HCT 26.8 (*)    MCV 112.1 (*)    MCH 36.4 (*)     Platelets 86 (*)    Monocytes Absolute 0.0 (*)    All other components within normal limits  GLUCOSE, CAPILLARY - Abnormal; Notable for the following components:   Glucose-Capillary 182 (*)    All other components within normal limits  HEMOGLOBIN A1C - Abnormal; Notable for the following components:   Hgb A1c MFr Bld 5.7 (*)    All other components within normal limits  GLUCOSE, CAPILLARY - Abnormal; Notable for the following components:   Glucose-Capillary 233 (*)    All other components within normal limits  GLUCOSE, CAPILLARY - Abnormal; Notable for the following components:   Glucose-Capillary 227 (*)    All other components within normal limits  GLUCOSE, CAPILLARY - Abnormal; Notable for the following components:   Glucose-Capillary 209 (*)    All other components within normal limits  GLUCOSE, CAPILLARY - Abnormal; Notable for the following components:   Glucose-Capillary 228 (*)    All other components within normal limits  GLUCOSE, CAPILLARY - Abnormal; Notable for the following components:   Glucose-Capillary 214 (*)    All other components within normal limits  TSH - Abnormal; Notable for the following components:   TSH 0.060 (*)    All other components within normal limits  COMPREHENSIVE METABOLIC PANEL - Abnormal; Notable for the following components:   Glucose, Bld 230 (*)    Total Protein 6.0 (*)    Albumin 2.6 (*)    All other components within normal limits  CBC WITH DIFFERENTIAL/PLATELET - Abnormal; Notable for the following components:   RBC 2.56 (*)    Hemoglobin 9.3 (*)    HCT 28.0 (*)    MCV 109.4 (*)    MCH 36.3 (*)    Platelets 122 (*)    nRBC 0.4 (*)    Abs Immature Granulocytes 0.16 (*)    All other components within normal limits  GLUCOSE, CAPILLARY - Abnormal; Notable for the following components:   Glucose-Capillary 222 (*)    All other components within normal limits  GLUCOSE, CAPILLARY - Abnormal; Notable for the following components:    Glucose-Capillary 281 (*)    All other components within normal limits  GLUCOSE, CAPILLARY - Abnormal; Notable for the following components:   Glucose-Capillary 257 (*)    All other components within normal limits  CBG MONITORING, ED - Abnormal; Notable for the following components:   Glucose-Capillary 182 (*)    All other components within normal limits  CBG MONITORING, ED - Abnormal; Notable for the following components:   Glucose-Capillary 172 (*)    All other components within normal limits  RESP PANEL BY RT-PCR (FLU A&B, COVID) ARPGX2  CULTURE, BLOOD (ROUTINE X 2)  CULTURE, BLOOD (ROUTINE X 2)  URINE CULTURE  RESPIRATORY PANEL BY PCR  EXPECTORATED SPUTUM ASSESSMENT W GRAM STAIN, RFLX TO RESP C  LIPASE, BLOOD  LACTIC ACID, PLASMA  HEMOGLOBIN A1C  POTASSIUM  MAGNESIUM  MAGNESIUM  PHOSPHORUS  LIPID PANEL  LACTIC ACID, PLASMA  PROCALCITONIN  MAGNESIUM  PHOSPHORUS  TROPONIN I (HIGH SENSITIVITY)  TROPONIN I (HIGH SENSITIVITY)   ____________________________________________  EKG  EKG Interpretation  Date/Time:  Monday Sep 02 2020 18:02:25 EDT Ventricular Rate:  120 PR Interval:  176 QRS Duration: 68 QT Interval:  299 QTC Calculation: 423 R Axis:   78 Text Interpretation: Sinus tachycardia Consider left atrial enlargement Low voltage, extremity leads Nonspecific T abnormalities, lateral leads Borderline ST elevation Artifact in lead(s) I II aVR aVL aVF V1 V2 V3 V4 V5 V6 No significant change since last tracing Confirmed by Gareth Morgan [phone]) on 09/03/2020 3:11:18 PM       ____________________________________________  RADIOLOGY  DG CHEST PORT 1 VIEW  Result Date: 09/04/2020 CLINICAL DATA:  Shortness of breath, COPD EXAM: PORTABLE CHEST 1 VIEW COMPARISON:  09/02/2020 FINDINGS: No focal consolidation. No pleural effusion or pneumothorax. Heart and mediastinal contours are unremarkable. Deep brain stimulator power packs projecting over the right and left anterior  chest walls. No acute osseous abnormality. IMPRESSION: No active disease. Electronically Signed   By: Kathreen Devoid   On: 09/04/2020 09:49   DG Swallowing Func-Speech Pathology  Result Date: 09/04/2020 Objective Swallowing Evaluation: Type of Study: MBS-Modified Barium Swallow Study  Patient Details Name: [name] MRN: [id number] Date of Birth: [birth year] Today's Date: 09/04/2020 Time: SLP Start Time (ACUTE ONLY): 1210 -SLP Stop Time (ACUTE ONLY): 1238 SLP Time Calculation (min) (ACUTE ONLY): 28 min Past Medical History: Past Medical History: Diagnosis Date . Allergic rhinitis   uses Nasonex daily as needed . Allergy to angiotensin receptor blockers (ARB) 01/22/2014  Angioedema . Anxiety   takes Ativan daily as needed . Aspiration pneumonia ([place])  . Asthma   Albuterol inhaler and Neb daily as needed . 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 [name], residual palpitations . Bipolar 1 disorder ([place])  . Chronic back pain   reason unknown . Constipation  . COPD (chronic obstructive pulmonary disease) ([place])  . DDD (degenerative disc disease), lumbar  . Depression   takes Zoloft daily as well as Cymbalta . Diabetes mellitus   Type II . Dysphagia  . Dysrhythmia   SVT ==ablation done by Dr. Rayann Heman 2007 . Esophageal reflux   takes Zantac daily . Frequent headaches  . History of bronchitis Dec 2014 . Hypertension   takes Losartan daily . Hypothyroidism   takes Synthroid daily . Joint swelling   left ankle . Low sodium syndrome  . Lumbar radiculopathy  . Seizures ([place]) 04-05-13  one time ran out of Primidone and had stopped it cold [place] . SVT (supraventricular tachycardia) ([place])  . Tremor, essential   takes Mysoline and Neurontin daily. . Vocal cord dysfunction  . Weakness   in left arm Past Surgical History: Past Surgical History: Procedure Laterality Date . ABDOMINAL HYSTERECTOMY   . ABLATION FOR AVNRT   . APPENDECTOMY   . BARTHOLIN GLAND CYST EXCISION    x 2 . BIOPSY   03/04/2015  Procedure: BIOPSY (Duodenal, Gastric);  Surgeon: Daneil Dolin, MD;  [facility];  Service: Endoscopy;; . BIOPSY  12/19/2018  Procedure: BIOPSY;  Surgeon: Daneil Dolin, MD;  [facility];  Service: Endoscopy;;  Gastric Biopsies  . BREAST SURGERY   . CESAREAN SECTION   . CHOLECYSTECTOMY   . COLONOSCOPY  01/16/2004  XTG:GYIRSW rectum/colon . COLONOSCOPY WITH PROPOFOL N/A 03/04/2015  Dr.[name]- internal hemorrhoids, pancolonic diverticulosis, colonic polyp= tubular adenoma . DEEP BRAIN STIMULATOR PLACEMENT   . DENTAL SURGERY   . ESOPHAGEAL DILATION N/A 03/04/2015  Procedure: ESOPHAGEAL DILATION WITH 54FR MALONEY DILATOR;  Surgeon:  Daneil Dolin, MD;  [facility];  Service: Endoscopy;  Laterality: N/A; . ESOPHAGEAL DILATION  12/19/2018  Procedure: ESOPHAGEAL DILATION;  Surgeon: Daneil Dolin, MD;  [facility];  Service: Endoscopy;; . ESOPHAGOGASTRODUODENOSCOPY (EGD) WITH ESOPHAGEAL DILATION  04/03/2002  WIO:[id number]'M ring, otherwise normal esophagus, status post dilation with 56 F/Normal stomach . ESOPHAGOGASTRODUODENOSCOPY (EGD) WITH ESOPHAGEAL DILATION N/A 11/08/2012  Dr. [name]:schatzki's ring s/p dilation/hiatal hernia . ESOPHAGOGASTRODUODENOSCOPY (EGD) WITH PROPOFOL N/A 03/04/2015  Dr.[name]- schatzki's ring, hiatal hernia, scattered erosions bx= chronic inactive gastritis . ESOPHAGOGASTRODUODENOSCOPY (EGD) WITH PROPOFOL N/A 05/06/2017  Normal esophagus. Dilated. Small hiatal hernia. Normal duodenal bulb and second portion of duodenum. . ESOPHAGOGASTRODUODENOSCOPY (EGD) WITH PROPOFOL N/A 12/19/2018  [name]: Mild [name] ring status post dilation, multiple erosions in the stomach, biopsies negative. Marland Kitchen FINGER SURGERY   . HEMORRHOID BANDING  2017  Dr.[name] . LEFT ANKLE LIGAMENT REPAIR    x 2 . LEFT ELBOW REPAIR   . MALONEY DILATION N/A 05/06/2017  Procedure: Venia Minks DILATION;  Surgeon: Daneil Dolin, MD;  [facility];  Service: Endoscopy;  Laterality: N/A; .  Venia Minks DILATION N/A 12/19/2018  Procedure: Venia Minks DILATION;  Surgeon: Daneil Dolin, MD;  [facility];  Service: Endoscopy;  Laterality: N/A; . MAXIMUM ACCESS (MAS)POSTERIOR LUMBAR INTERBODY FUSION (PLIF) 1 LEVEL N/A 04/24/2014  Procedure: Lumbar four-five Maximum access Surgery posterior lumbar interbody fusion;  Surgeon: [name], MD;  Location: [place] [facility];  Service: Neurosurgery;  Laterality: N/A;  Lumbar four-five Maximum access Surgery posterior lumbar interbody fusion . MULTIPLE EXTRACTIONS WITH ALVEOLOPLASTY N/A 06/18/2017  Procedure: MULTIPLE EXTRACTION;  Surgeon: Diona Browner, DDS;  Location: [place];  Service: Oral Surgery;  Laterality: N/A; . PARTIAL HYSTERECTOMY   . POLYPECTOMY  03/04/2015  Procedure: POLYPECTOMY (descending colon);  Surgeon: Daneil Dolin, MD;  [facility];  Service: Endoscopy;; . PULSE GENERATOR IMPLANT Bilateral 12/15/2013  Procedure: BILATERAL PULSE GENERATOR IMPLANT;  Surgeon: [name], MD;  Location: [place] [facility];  Service: Neurosurgery;  Laterality: Bilateral;  BILATERAL . RIGHT BREAST CYST    benign . SUBTHALAMIC STIMULATOR BATTERY REPLACEMENT Bilateral 06/24/2018  Procedure: Bilateral implantable pulse generator battery change;  Surgeon: [name], MD;  Location: [place];  Service: Neurosurgery;  Laterality: Bilateral;  bilateral . SUBTHALAMIC STIMULATOR INSERTION Bilateral 12/04/2013  Procedure: Bilateral Deep brain stimulator placement;  Surgeon: [name], MD;  Location: [place] [facility];  Service: Neurosurgery;  Laterality: Bilateral;  Bilateral Deep brain stimulator placement . TONSILLECTOMY   HPI: 70 y.o. WF PMHx Depression, COPD, asthma with chronic respiratory failure on 2 L O2 at home, OSA depression, HTN, thrombocytopenia, aspiration pneumonia, tremor with deep brain stimulator. Patient presented to the ED for complaints of difficulty breathing, with cough productive of yellowish sputum. Pt had EGD September 2020, mild [name] ring status  post dilation.  Multiple erosions in the stomach, biopsies negative. MBSS December 2020 with mild pharyngeal aspiration with wet quality to improve after clearing of the throat recommended regular textures and thin liquids but no straws and standard aspiration reflux precautions. Per CT chest, circumferential thickening of the distal third of the esophagus differentials reflux esophagitis versus esophageal metaplasia or neoplasia, further evaluation with nonemergent endoscopy should be considered. BSE requested.  Subjective: "I have had trouble swallowing for years." Assessment / Plan / Recommendation CHL IP CLINICAL IMPRESSIONS 09/04/2020 Clinical Impression Pt presents with mild oropharyngeal phase dysphagia with impaired lingual movement resulting in premature spillage of liquids (pt unable to hold thin liquids in  oral cavity without spilling over base of tongue despite cues) and prolonged oral transit of solids, min delay in swallow initiation but more due to premature spillage with reduced laryngeal vestibule closure resulting in trace penetration and aspiration of thins (below the cords and then expelled with occasional verbal cue) and min vallecular residue. No appreciable difference with NTL. Esophageal sweep revealed delayed emptying and retrograde bolus movement in distal esophagus. Recommend regular textures and thin liquids when Pt is sitting upright, ok for small sips via straw and po medications whole with straw, pt to clear throat/cough intermittently and repeat swallow. SLP Visit Diagnosis Dysphagia, oropharyngeal phase (R13.12) Attention and concentration deficit following -- Frontal lobe and executive function deficit following -- Impact on safety and function Mild aspiration risk   [facility] IP TREATMENT RECOMMENDATION 09/04/2020 Treatment Recommendations Therapy as outlined in treatment plan below   Prognosis 09/04/2020 Prognosis for Safe Diet Advancement Fair Barriers to Reach Goals Severity of deficits  Barriers/Prognosis Comment -- [facility] IP DIET RECOMMENDATION 09/04/2020 SLP Diet Recommendations Regular solids;Thin liquid Liquid Administration via Cup;Straw Medication Administration Whole meds with liquid Compensations Small sips/bites;Slow rate;Clear throat intermittently Postural Changes Remain semi-upright after after feeds/meals (Comment);Seated upright at 90 degrees   [facility] IP OTHER RECOMMENDATIONS 09/04/2020 Recommended Consults -- Oral Care Recommendations Oral care BID;Staff/trained caregiver to provide oral care Other Recommendations Clarify dietary restrictions   [facility] IP FOLLOW UP RECOMMENDATIONS 09/04/2020 Follow up Recommendations Skilled Nursing facility   [facility] IP FREQUENCY AND DURATION 09/04/2020 Speech Therapy Frequency (ACUTE ONLY) min 2x/week Treatment Duration 1 week      [facility] IP ORAL PHASE 09/04/2020 Oral Phase Impaired Oral - Pudding Teaspoon -- Oral - Pudding Cup -- Oral - Honey Teaspoon -- Oral - Honey Cup -- Oral - Nectar Teaspoon -- Oral - Nectar Cup -- Oral - Nectar Straw Piecemeal swallowing;Lingual/palatal residue Oral - Thin Teaspoon Lingual/palatal residue;Piecemeal swallowing;Premature spillage Oral - Thin Cup -- Oral - Thin Straw Lingual/palatal residue;Piecemeal swallowing;Premature spillage Oral - Puree WFL Oral - Mech Soft -- Oral - Regular Delayed oral transit;Piecemeal swallowing Oral - Multi-Consistency -- Oral - Pill WFL Oral Phase - Comment --  [facility] IP PHARYNGEAL PHASE 09/04/2020 Pharyngeal Phase Impaired Pharyngeal- Pudding Teaspoon -- Pharyngeal -- Pharyngeal- Pudding Cup -- Pharyngeal -- Pharyngeal- Honey Teaspoon -- Pharyngeal -- Pharyngeal- Honey Cup -- Pharyngeal -- Pharyngeal- Nectar Teaspoon -- Pharyngeal -- Pharyngeal- Nectar Cup -- Pharyngeal -- Pharyngeal- Nectar Straw Delayed swallow initiation-pyriform sinuses;Pharyngeal residue - valleculae Pharyngeal -- Pharyngeal- Thin Teaspoon Delayed swallow initiation-pyriform sinuses Pharyngeal -- Pharyngeal- Thin Cup -- Pharyngeal --  Pharyngeal- Thin Straw Delayed swallow initiation-pyriform sinuses;Penetration/Aspiration during swallow;Trace aspiration;Pharyngeal residue - valleculae Pharyngeal Material does not enter airway;Material enters airway, passes BELOW cords then ejected out Pharyngeal- Puree Delayed swallow initiation-vallecula Pharyngeal -- Pharyngeal- Mechanical Soft -- Pharyngeal -- Pharyngeal- Regular Delayed swallow initiation-vallecula Pharyngeal -- Pharyngeal- Multi-consistency -- Pharyngeal -- Pharyngeal- Pill WFL Pharyngeal -- Pharyngeal Comment --  [facility] IP CERVICAL ESOPHAGEAL PHASE 09/04/2020 Cervical Esophageal Phase WFL Pudding Teaspoon -- Pudding Cup -- Honey Teaspoon -- Honey Cup -- Nectar Teaspoon -- Nectar Cup -- Nectar Straw -- Thin Teaspoon -- Thin Cup -- Thin Straw -- Puree -- Mechanical Soft -- Regular -- Multi-consistency -- Pill -- Cervical Esophageal Comment -- Thank you, Genene Churn, CCC-SLP [phone] PORTER,DABNEY 09/04/2020, 1:11 PM               ____________________________________________   PROCEDURES  Procedure(s) performed:   Procedures  CRITICAL CARE Performed by: Margette Fast Total critical care  time: 35 minutes Critical care time was exclusive of separately billable procedures and treating other patients. Critical care was necessary to treat or prevent imminent or life-threatening deterioration. Critical care was time spent personally by me on the following activities: development of treatment plan with patient and/or surrogate as well as nursing, discussions with consultants, evaluation of patient's response to treatment, examination of patient, obtaining history from patient or surrogate, ordering and performing treatments and interventions, ordering and review of laboratory studies, ordering and review of radiographic studies, pulse oximetry and re-evaluation of patient's condition.  Nanda Quinton, MD Emergency Medicine  ____________________________________________   INITIAL  IMPRESSION / ASSESSMENT AND PLAN / ED COURSE  Pertinent labs & imaging results that were available during my care of the patient were reviewed by me and considered in my medical decision making (see chart for details).   Patient presents to the emergency department with productive cough.  She has loud, rattling respirations.  No report of vomiting per EMS or staff at the facility but patient does have a stain on her gown which looks to be from either cough or vomiting.  Question aspiration pneumonia.  She does have history of this in the past.  She does have some vocal cord dysfunction which makes history somewhat limited.  She is requiring increased oxygen.   CT of the abdomen pelvis reviewed.  No active abdominal process.  CT shows pneumonia which correlates clinically with patient's symptoms.    Discussed patient's case with [facility] to request admission. Patient and family (if present) updated with plan. Care transferred to [facility] service.  I reviewed all nursing notes, vitals, pertinent old records, EKGs, labs, imaging (as available).  ____________________________________________  FINAL CLINICAL IMPRESSION(S) / ED DIAGNOSES  Final diagnoses:  SOB (shortness of breath)  Fever, unspecified fever cause     MEDICATIONS GIVEN DURING THIS VISIT:  Medications  0.9 % NaCl with KCl 40 mEq / L  infusion (0 mL/hr Intravenous Stopped 09/03/20 1453)  acetaminophen (TYLENOL) tablet 650 mg (650 mg Oral Not Given 09/02/20 2201)    Or  acetaminophen (TYLENOL) suppository 650 mg ( Rectal See Alternative 09/02/20 2201)  ondansetron (ZOFRAN) tablet 4 mg (has no administration in time range)    Or  ondansetron (ZOFRAN) injection 4 mg (has no administration in time range)  fluticasone furoate-vilanterol (BREO ELLIPTA) 100-25 MCG/INH 1 puff (1 puff Inhalation Given 09/04/20 0814)  ipratropium-albuterol (DUONEB) 0.5-2.5 (3) MG/3ML nebulizer solution 3 mL (3 mLs Nebulization Given 09/03/20 1255)  valproate (DEPACON)  500 mg in dextrose 5 % 50 mL IVPB (0 mg Intravenous Stopped 09/04/20 2221)  Ampicillin-Sulbactam (UNASYN) 3 g in sodium chloride 0.9 % 100 mL IVPB (3 g Intravenous New Bag/Given 09/05/20 0627)  azithromycin (ZITHROMAX) 500 mg in sodium chloride 0.9 % 250 mL IVPB (0 mg Intravenous Stopped 09/04/20 2036)  mupirocin ointment (BACTROBAN) 2 % 1 application (1 application Nasal Given 09/04/20 2126)  Chlorhexidine Gluconate Cloth 2 % PADS 6 each (6 each Topical Given 09/05/20 0628)  methylPREDNISolone sodium succinate (SOLU-MEDROL) 125 mg/2 mL injection 60 mg (60 mg Intravenous Given 09/05/20 0204)  acidophilus (RISAQUAD) capsule 1 capsule (1 capsule Oral Given 09/04/20 1733)  metoprolol tartrate (LOPRESSOR) tablet 50 mg (50 mg Oral Given 09/04/20 2133)  diltiazem (CARDIZEM) tablet 60 mg (60 mg Oral Given 09/05/20 0627)  lactated ringers infusion (0 mLs Intravenous Stopped 09/04/20 2123)  levothyroxine (SYNTHROID) tablet 75 mcg (75 mcg Oral Given 09/05/20 0627)  ipratropium (ATROVENT) nebulizer solution 0.5 mg (0.5 mg Nebulization Given  09/05/20 0112)  levalbuterol (XOPENEX) nebulizer solution 1.25 mg (1.25 mg Nebulization Given 09/05/20 0112)  insulin aspart (novoLOG) injection 0-15 Units (has no administration in time range)  insulin aspart (novoLOG) injection 0-5 Units (3 Units Subcutaneous Given 09/04/20 2319)  acetaminophen (TYLENOL) tablet 1,000 mg (1,000 mg Oral Given 09/02/20 1323)  sodium chloride 0.9 % bolus 1,000 mL (0 mLs Intravenous Stopped 09/02/20 1607)  ceFEPIme (MAXIPIME) 2 g in sodium chloride 0.9 % 100 mL IVPB (0 g Intravenous Stopped 09/02/20 1432)  metroNIDAZOLE (FLAGYL) IVPB 500 mg (0 mg Intravenous Stopped 09/02/20 1534)  vancomycin (VANCOCIN) IVPB 1000 mg/200 mL premix (0 mg Intravenous Stopped 09/02/20 1432)  magnesium sulfate IVPB 2 g 50 mL (0 g Intravenous Stopped 09/02/20 1949)  sodium chloride 0.9 % bolus 1,000 mL (0 mLs Intravenous Stopped 09/02/20 2246)  acetaminophen (TYLENOL) tablet 1,000 mg (1,000 mg  Oral Given 09/02/20 2051)  potassium chloride 10 mEq in 100 mL IVPB (0 mEq Intravenous Stopped 09/03/20 1846)  magnesium sulfate IVPB 2 g 50 mL (0 g Intravenous Stopped 09/04/20 1419)  potassium chloride SA (KLOR-CON) CR tablet 20 mEq (20 mEq Oral Given 09/04/20 1315)    Note:  This document was prepared using Dragon voice recognition software and may include unintentional dictation errors.  Nanda Quinton, MD, [facility] Emergency Medicine    Liandra Mendia, W[facility], MD 09/05/20 [phone]

## 2020-09-02 NOTE — ED Notes (Signed)
In and out cath with RN Eustaquio Maize and Darnelle Maffucci, tolerated well, purewick placed

## 2020-09-02 NOTE — ED Notes (Signed)
Pt has removed [place], pt satting 89%, replaced [place], pt now satting back in the mid to high 90s

## 2020-09-02 NOTE — ED Notes (Signed)
Pt moved to er room number 3, pt placed on cardiac monitor and O2 sat monitor with alarms, pt eyes open, pt oriented to self and place, doesn't know day of the week, re oriented pt, pt hot to the touch, rectal temp 101.2, md notified,

## 2020-09-02 NOTE — Progress Notes (Signed)
Pharmacy Antibiotic Note  [name] a 70 y.o. female admitted on 09/02/2020 with sepsis.  Pharmacy has been consulted for vancomycin and cefepime dosing.  Plan: Vancomycin 1000mg  IV every 12 hours.  Goal trough 15-20 mcg/mL. Cefepime 2gm IV every 8 hours.  Medical History: Past Medical History:  Diagnosis Date  . Allergic rhinitis    uses Nasonex daily as needed  . Allergy to angiotensin receptor blockers (ARB) 01/22/2014   Angioedema  . Anxiety    takes Ativan daily as needed  . Aspiration pneumonia ([place])   . Asthma    Albuterol inhaler and Neb daily as needed  . 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 [name], residual palpitations  . Bipolar 1 disorder ([place])   . Chronic back pain    reason unknown  . Constipation   . COPD (chronic obstructive pulmonary disease) ([place])   . DDD (degenerative disc disease), lumbar   . Depression    takes Zoloft daily as well as Cymbalta  . Diabetes mellitus    Type II  . Dysphagia   . Dysrhythmia    SVT ==ablation done by Dr. Rayann Heman 2007  . Esophageal reflux    takes Zantac daily  . Frequent headaches   . History of bronchitis Dec 2014  . Hypertension    takes Losartan daily  . Hypothyroidism    takes Synthroid daily  . Joint swelling    left ankle  . Low sodium syndrome   . Lumbar radiculopathy   . Seizures ([place]) 04-05-13   one time ran out of Primidone and had stopped it cold [place]  . SVT (supraventricular tachycardia) ([place])   . Tremor, essential    takes Mysoline and Neurontin daily.  . Vocal cord dysfunction   . Weakness    in left arm    Allergies:  Allergies  Allergen Reactions  . Invokana [Canagliflozin] Other (See Comments)    Yeast infectios  . Losartan Other (See Comments)    Angioedema   . Metformin And Related Diarrhea  . Ace Inhibitors     History of angioedema with losartan, ARB  . Latex Other (See Comments)    Other Reaction: redness, blisters   . Other Other (See Comments)    Air fresheners - on MAR  . Oxycodone-Acetaminophen Itching and Other (See Comments)    TYLOX. Caused internal itching per patient   . Sulfa Antibiotics Other (See Comments)  . Mucinex [Guaifenesin Er] Other (See Comments)    MUCINEX REACTION: asthma attacks    Filed Weights   09/02/20 1107  Weight: 80.6 kg (177 lb 11.1 oz)    CBC Latest Ref Rng & Units 09/02/2020 09/21/2019 09/15/2019  WBC 4.0 - 10.5 K/uL 9.1 4.5 5.6  Hemoglobin 12.0 - 15.0 g/dL 11.1(L) 11.8(A) 12.6  Hematocrit 36.0 - 46.0 % 32.7(L) 35(A) 37.1  Platelets 150 - 400 K/uL 83(L) 82(A) 128(L)     Estimated Creatinine Clearance: 66.3 mL/min (A) (by C-G formula based on SCr of 0.43 mg/dL (L)).  Antibiotics Given (last 72 hours)    None      Antimicrobials this admission:  Cefepime 09/02/2020  >>  vancomycin 09/02/2020  >>  Metronidazole 09/02/2020   x 1   Microbiology results: 09/02/2020  BCx: sent 09/02/2020  UCx: sent 09/02/2020  Resp Panel: negative 09/02/2020  MRSA PCR: sent  Thank you for allowing pharmacy to be a part of this patient's care.  Thomasenia Sales, PharmD Clinical  Pharmacist

## 2020-09-02 NOTE — ED Notes (Signed)
Pt unable to sign MSE 

## 2020-09-02 NOTE — Progress Notes (Signed)
Pharmacy Antibiotic Note  [name] a 70 y.o. female admitted on 09/02/2020 with aspiration pneumonia.  Pharmacy has been consulted for unasyn dosing.  Plan: unasyn 3gm iv q6h   Medical History: Past Medical History:  Diagnosis Date  . Allergic rhinitis    uses Nasonex daily as needed  . Allergy to angiotensin receptor blockers (ARB) 01/22/2014   Angioedema  . Anxiety    takes Ativan daily as needed  . Aspiration pneumonia ([place])   . Asthma    Albuterol inhaler and Neb daily as needed  . 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 [name], residual palpitations  . Bipolar 1 disorder ([place])   . Chronic back pain    reason unknown  . Constipation   . COPD (chronic obstructive pulmonary disease) ([place])   . DDD (degenerative disc disease), lumbar   . Depression    takes Zoloft daily as well as Cymbalta  . Diabetes mellitus    Type II  . Dysphagia   . Dysrhythmia    SVT ==ablation done by Dr. Rayann Heman 2007  . Esophageal reflux    takes Zantac daily  . Frequent headaches   . History of bronchitis Dec 2014  . Hypertension    takes Losartan daily  . Hypothyroidism    takes Synthroid daily  . Joint swelling    left ankle  . Low sodium syndrome   . Lumbar radiculopathy   . Seizures ([place]) 04-05-13   one time ran out of Primidone and had stopped it cold [place]  . SVT (supraventricular tachycardia) ([place])   . Tremor, essential    takes Mysoline and Neurontin daily.  . Vocal cord dysfunction   . Weakness    in left arm    Allergies:  Allergies  Allergen Reactions  . Invokana [Canagliflozin] Other (See Comments)    Yeast infectios  . Losartan Other (See Comments)    Angioedema   . Metformin And Related Diarrhea  . Ace Inhibitors     History of angioedema with losartan, ARB  . Latex Other (See Comments)    Other Reaction: redness, blisters  . Other Other (See Comments)    Air fresheners - on MAR  .  Oxycodone-Acetaminophen Itching and Other (See Comments)    TYLOX. Caused internal itching per patient   . Sulfa Antibiotics Other (See Comments)  . Mucinex [Guaifenesin Er] Other (See Comments)    MUCINEX REACTION: asthma attacks    Filed Weights   09/02/20 1107  Weight: 80.6 kg (177 lb 11.1 oz)    CBC Latest Ref Rng & Units 09/02/2020 09/21/2019 09/15/2019  WBC 4.0 - 10.5 K/uL 9.1 4.5 5.6  Hemoglobin 12.0 - 15.0 g/dL 11.1(L) 11.8(A) 12.6  Hematocrit 36.0 - 46.0 % 32.7(L) 35(A) 37.1  Platelets 150 - 400 K/uL 83(L) 82(A) 128(L)     Estimated Creatinine Clearance: 66.3 mL/min (A) (by C-G formula based on SCr of 0.43 mg/dL (L)).  Antibiotics Given (last 72 hours)    Date/Time Action Medication Dose Rate   09/02/20 1326 New Bag/Given   ceFEPIme (MAXIPIME) 2 g in sodium chloride 0.9 % 100 mL IVPB 2 g 200 mL/hr   09/02/20 1329 New Bag/Given   vancomycin (VANCOCIN) IVPB 1000 mg/200 mL premix 1,000 mg 200 mL/hr   09/02/20 1434 New Bag/Given   metroNIDAZOLE (FLAGYL) IVPB 500 mg 500 mg 100 mL/hr      Antimicrobials this admission:  Cefepime 09/02/2020  >> 5/30 vancomycin  09/02/2020  >> 5/30 Metronidazole 09/02/2020   x 1  unasyn 09/02/2020>>  Microbiology results: 09/02/2020  BCx: sent 09/02/2020  UCx: sent 09/02/2020  Resp Panel: negative 09/02/2020  MRSA PCR: sent  Thank you for allowing pharmacy to be a part of this patient's care.  Thomasenia Sales, PharmD Clinical Pharmacist

## 2020-09-02 NOTE — Progress Notes (Signed)
Patient has been having HR in 130s and reached 140 just a short time ago.  Did not give Duoneb treatment due to this.  Explained to RN.  Put in order for Xopenex if MD wants patient to have it instead of Duoneb so that we are not adding to issue of increased HR.

## 2020-09-02 NOTE — H&P (Signed)
History and Physical    [name] QXI:[id number] DOB: [birth year] DOA: 09/02/2020  PCP: Abran Richard, MD   Patient coming from: [facility]  I have personally briefly reviewed patient's old medical records in [place]  Chief Complaint: Difficulty breathing  HPI: [name] is a 70 y.o. female with medical history significant for COPD, asthma with chronic respiratory failure on 2 L depression, hypertension, OSA, thrombocytopenia, aspiration pneumonia, tremor with deep brain stimulator. Patient presented to the ED for complaints of difficulty breathing, with cough productive of yellowish sputum.  Patient is able to answer very few questions, as at the time of my evaluation, patient starts coughing significantly limiting further history.  Patient tells me she has been having difficulty swallowing liquids, for which she had a barium swallow done.  She has not had any problems with solids.  This is a chronic issue for patient.  She denies vomiting. At the facility, patient's O2 sat had to be increased to 4 L  ED Course: Febrile - 101.2, tachycardic to 126, respiratory rate 18-28, blood pressure systolic 03-491.  O2 sats 93 - 97% on 4 L.  WBC 9.1.  Lactic acid 1.  UA ketones protein and rare bacteria.  Chest CT shows multilobar bilateral pneumonia most severe in the right lower lobe.  Small parapneumonic right-sided pleural effusion. IV vancomycin cefepime and metronidazole started.  1 L bolus given and Ringer's lactate at 150 cc/h started.  Hospitalist to admit.  Review of Systems: As per HPI all other systems reviewed and negative.  Past Medical History:  Diagnosis Date  . Allergic rhinitis    uses Nasonex daily as needed  . Allergy to angiotensin receptor blockers (ARB) 01/22/2014   Angioedema  . Anxiety    takes Ativan daily as needed  . Aspiration pneumonia ([place])   . Asthma    Albuterol inhaler and Neb daily as needed  . 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 [name], residual palpitations  . Bipolar 1 disorder ([place])   . Chronic back pain    reason unknown  . Constipation   . COPD (chronic obstructive pulmonary disease) ([place])   . DDD (degenerative disc disease), lumbar   . Depression    takes Zoloft daily as well as Cymbalta  . Diabetes mellitus    Type II  . Dysphagia   . Dysrhythmia    SVT ==ablation done by Dr. Rayann Heman 2007  . Esophageal reflux    takes Zantac daily  . Frequent headaches   . History of bronchitis Dec 2014  . Hypertension    takes Losartan daily  . Hypothyroidism    takes Synthroid daily  . Joint swelling    left ankle  . Low sodium syndrome   . Lumbar radiculopathy   . Seizures ([place]) 04-05-13   one time ran out of Primidone and had stopped it cold [place]  . SVT (supraventricular tachycardia) ([place])   . Tremor, essential    takes Mysoline and Neurontin daily.  . Vocal cord dysfunction   . Weakness    in left arm    Past Surgical History:  Procedure Laterality Date  . ABDOMINAL HYSTERECTOMY    . ABLATION FOR AVNRT    . APPENDECTOMY    . BARTHOLIN GLAND CYST EXCISION     x 2  . BIOPSY  03/04/2015   Procedure: BIOPSY (Duodenal, Gastric);  Surgeon: Daneil Dolin, MD;  Location: [facility];  Service: Endoscopy;;  . BIOPSY  12/19/2018   Procedure: BIOPSY;  Surgeon: Daneil Dolin, MD;  [facility]: [facility];  Service: Endoscopy;;  Gastric Biopsies    . BREAST SURGERY    . CESAREAN SECTION    . CHOLECYSTECTOMY    . COLONOSCOPY  01/16/2004   PIR:JJOACZ rectum/colon  . COLONOSCOPY WITH PROPOFOL N/A 03/04/2015   Dr.[name]- internal hemorrhoids, pancolonic diverticulosis, colonic polyp= tubular adenoma  . DEEP BRAIN STIMULATOR PLACEMENT    . DENTAL SURGERY    . ESOPHAGEAL DILATION N/A 03/04/2015   Procedure: ESOPHAGEAL DILATION WITH 54FR MALONEY DILATOR;  Surgeon: Daneil Dolin, MD;  Location: [facility];  Service: Endoscopy;  Laterality: N/A;  .  ESOPHAGEAL DILATION  12/19/2018   Procedure: ESOPHAGEAL DILATION;  Surgeon: Daneil Dolin, MD;  [facility];  Service: Endoscopy;;  . ESOPHAGOGASTRODUODENOSCOPY (EGD) WITH ESOPHAGEAL DILATION  04/03/2002   YSA:[id number] ring, otherwise normal esophagus, status post dilation with 56 F/Normal stomach  . ESOPHAGOGASTRODUODENOSCOPY (EGD) WITH ESOPHAGEAL DILATION N/A 11/08/2012   Dr. [name]:schatzki's ring s/p dilation/hiatal hernia  . ESOPHAGOGASTRODUODENOSCOPY (EGD) WITH PROPOFOL N/A 03/04/2015   Dr.[name]- schatzki's ring, hiatal hernia, scattered erosions bx= chronic inactive gastritis  . ESOPHAGOGASTRODUODENOSCOPY (EGD) WITH PROPOFOL N/A 05/06/2017   Normal esophagus. Dilated. Small hiatal hernia. Normal duodenal bulb and second portion of duodenum.  . ESOPHAGOGASTRODUODENOSCOPY (EGD) WITH PROPOFOL N/A 12/19/2018   [name]: Mild [name] ring status post dilation, multiple erosions in the stomach, biopsies negative.  Marland Kitchen FINGER SURGERY    . HEMORRHOID BANDING  2017   Dr.[name]  . LEFT ANKLE LIGAMENT REPAIR     x 2  . LEFT ELBOW REPAIR    . MALONEY DILATION N/A 05/06/2017   Procedure: Venia Minks DILATION;  Surgeon: Daneil Dolin, MD;  [facility];  Service: Endoscopy;  Laterality: N/A;  . Venia Minks DILATION N/A 12/19/2018   Procedure: Venia Minks DILATION;  Surgeon: Daneil Dolin, MD;  [facility];  Service: Endoscopy;  Laterality: N/A;  . MAXIMUM ACCESS (MAS)POSTERIOR LUMBAR INTERBODY FUSION (PLIF) 1 LEVEL N/A 04/24/2014   Procedure: Lumbar four-five Maximum access Surgery posterior lumbar interbody fusion;  Surgeon: [name], MD;  Location: [place] [facility];  Service: Neurosurgery;  Laterality: N/A;  Lumbar four-five Maximum access Surgery posterior lumbar interbody fusion  . MULTIPLE EXTRACTIONS WITH ALVEOLOPLASTY N/A 06/18/2017   Procedure: MULTIPLE EXTRACTION;  Surgeon: Diona Browner, DDS;  Location: [place];  Service: Oral Surgery;  Laterality: N/A;  . PARTIAL  HYSTERECTOMY    . POLYPECTOMY  03/04/2015   Procedure: POLYPECTOMY (descending colon);  Surgeon: Daneil Dolin, MD;  Location: [facility];  Service: Endoscopy;;  . PULSE GENERATOR IMPLANT Bilateral 12/15/2013   Procedure: BILATERAL PULSE GENERATOR IMPLANT;  Surgeon: [name], MD;  Location: [place] [facility];  Service: Neurosurgery;  Laterality: Bilateral;  BILATERAL  . RIGHT BREAST CYST     benign  . SUBTHALAMIC STIMULATOR BATTERY REPLACEMENT Bilateral 06/24/2018   Procedure: Bilateral implantable pulse generator battery change;  Surgeon: [name], MD;  Location: [facility];  Service: Neurosurgery;  Laterality: Bilateral;  bilateral  . SUBTHALAMIC STIMULATOR INSERTION Bilateral 12/04/2013   Procedure: Bilateral Deep brain stimulator placement;  Surgeon: [name], MD;  Location: [place] [facility];  Service: Neurosurgery;  Laterality: Bilateral;  Bilateral Deep brain stimulator placement  . TONSILLECTOMY       reports that she quit smoking about 27 years ago. Her smoking use included cigarettes. She has a 3.00 pack-year smoking history. She has  never used smokeless tobacco. She reports that she does not drink alcohol and does not use drugs.  Allergies  Allergen Reactions  . Invokana [Canagliflozin] Other (See Comments)    Yeast infectios  . Losartan Other (See Comments)    Angioedema   . Metformin And Related Diarrhea  . Ace Inhibitors     History of angioedema with losartan, ARB  . Latex Other (See Comments)    Other Reaction: redness, blisters  . Other Other (See Comments)    Air fresheners - on MAR  . Oxycodone-Acetaminophen Itching and Other (See Comments)    TYLOX. Caused internal itching per patient   . Sulfa Antibiotics Other (See Comments)  . Mucinex [Guaifenesin Er] Other (See Comments)    MUCINEX REACTION: asthma attacks    Family History  Problem Relation Age of Onset  . Lung cancer Father        DIED AGE 8 LUNG CA  . Alcohol abuse Father   . Anxiety disorder Father   .  Depression Father   . COPD Mother        DIED AGE 55,MRSA,COPD,PNEUMONIA  . Pneumonia Mother        DIED AGE 55,MRSA,COPD,PNEUMONIA  . Supraventricular tachycardia Mother   . Anxiety disorder Mother   . Depression Mother   . Alcohol abuse Mother   . Brain cancer Son        Died at age 39  . Heart disease Sister        DIED AGE 77, SMOKER,?HEART  . COPD Brother        AGE 24  . Colon cancer Neg Hx     Prior to Admission medications   Medication Sig Start Date End Date Taking? Authorizing Provider  acetaminophen (TYLENOL) 500 MG tablet Take 500 mg by mouth every 6 (six) hours as needed for mild pain, fever or headache.    Yes [provider]  albuterol (PROAIR HFA) 108 (90 Base) MCG/ACT inhaler Inhale 2 puffs every 6 hours for asthma - rescue inhaler 05/28/17  Yes Young, Tarri Fuller D, MD  alum & mag hydroxide-simeth (MAALOX/MYLANTA) 200-200-20 MG/5ML suspension Take 30 mLs by mouth as needed for indigestion or heartburn. Max 4 doses per 24 hours   Yes [provider]  aspirin 81 MG chewable tablet Chew 81 mg by mouth in the morning.    Yes [provider]  baclofen (LIORESAL) 10 MG tablet Take 5 mg by mouth at bedtime.   Yes [provider]  Carboxymethylcellulose Sod PF 0.5 % SOLN Place 1 drop into both eyes 2 (two) times daily.    Yes [provider]  divalproex (DEPAKOTE) 500 MG DR tablet Take 500 mg by mouth 3 (three) times daily.    Yes [provider]  docusate sodium (COLACE) 100 MG capsule Take 100 mg by mouth in the morning.    Yes [provider]  fluticasone furoate-vilanterol (BREO ELLIPTA) 100-25 MCG/INH AEPB Inhale 1 puff into the lungs daily. 12/17/16  Yes Young, Tarri Fuller D, MD  gabapentin (NEURONTIN) 300 MG capsule Take 300 mg by mouth See admin instructions. Take 300 mg by mouth in the morning and 600 mg at bedtime   Yes [provider]  guaiFENesin-dextromethorphan (ROBITUSSIN DM) 100-10 MG/5ML syrup Take 5  mLs by mouth every 4 (four) hours as needed for cough.   Yes [provider]  ibuprofen (ADVIL) 800 MG tablet Take 800 mg by mouth every 8 (eight) hours as needed.   Yes [provider]  insulin glargine (LANTUS) 100 UNIT/ML injection Inject 15 Units into the skin at bedtime. *Hold if blood glucose is less than 160*   Yes [provider]  insulin lispro (HUMALOG) 100 UNIT/ML injection Inject into the skin 3 (three) times daily before meals. 0-59 call MD 60-200=0 201-250= 2 units 251-300=4 units 301-350= 6 units 351-400= 8 units 401-450= 10 units 451+ Call MD   Yes [provider]  ipratropium (ATROVENT) 0.03 % nasal spray Place 2 sprays into both nostrils 3 (three) times daily. Before meals   Yes [provider]  ipratropium-albuterol (DUONEB) 0.5-2.5 (3) MG/3ML SOLN Take 3 mLs by nebulization. 3 ml inhale orally every 6 hours as needed for SOB/wheezing   Yes [provider]  lactulose (CHRONULAC) 10 GM/15ML solution Take 10 g by mouth daily as needed for mild constipation.   Yes [provider]  levothyroxine (SYNTHROID, LEVOTHROID) 75 MCG tablet TAKE (1) TABLET BY MOUTH ONCE DAILY BEFORE BREAKFAST. Patient taking differently: Take 75 mcg by mouth daily before breakfast. 01/12/15  Yes Janith Lima, MD  loperamide (IMODIUM A-D) 2 MG tablet Take 2 mg by mouth 3 (three) times daily as needed for diarrhea or loose stools.    Yes [provider]  lubiprostone (AMITIZA) 24 MCG capsule Take 1 capsule (24 mcg total) by mouth 2 (two) times daily with a meal. 05/14/20  Yes Mahala Menghini, PA-C  magnesium hydroxide (MILK OF MAGNESIA) 400 MG/5ML suspension Take 30 mLs by mouth at bedtime as needed for mild constipation.    Yes [provider]  Menthol, Topical Analgesic, (BIOFREEZE) 4 % GEL Apply topically 2 (two) times daily. Apply to lower back topically 2 times a day for back pain.   Yes [provider]  metFORMIN  (GLUCOPHAGE) 500 MG tablet Take by mouth 2 (two) times daily with a meal.   Yes [provider]  metoprolol succinate (TOPROL-XL) 50 MG 24 hr tablet Take 50 mg by mouth in the morning. Take with or immediately following a meal.   Yes [provider]  omega-3 acid ethyl esters (LOVAZA) 1 g capsule Take 1 g by mouth daily.   Yes [provider]  pantoprazole (PROTONIX) 40 MG tablet Take 40 mg by mouth daily before breakfast.    Yes [provider]  Polyethyl Glycol-Propyl Glycol (SYSTANE) 0.4-0.3 % SOLN Apply to eye as needed.   Yes [provider]  polyethylene glycol (MIRALAX / GLYCOLAX) packet Take 17 g by mouth daily as needed for moderate constipation. 10/01/17  Yes Pollina, Gwenyth Allegra, MD  potassium chloride SA (K-DUR,KLOR-CON) 20 MEQ tablet Take 20 mEq by mouth daily.   Yes [provider]  primidone (MYSOLINE) 50 MG tablet Take 50 mg by mouth in the morning.    Yes [provider]  QUEtiapine (SEROQUEL) 100 MG tablet Take 100 mg by mouth at bedtime.   Yes [provider]  senna (SENOKOT) 8.6 MG TABS tablet Take 2 tablets by mouth daily as needed for mild constipation.   Yes [provider]  sertraline (ZOLOFT) 50 MG tablet Take 50 mg by mouth daily.   Yes [provider]  sitaGLIPtin (JANUVIA) 100 MG tablet Take 100 mg by mouth daily.   Yes [provider]  sodium chloride (OCEAN) 0.65 % nasal spray Place 2 sprays into the nose as needed (Rhinitis). Both nostrils   Yes [provider]  sodium chloride 1 g tablet Take 1 g by mouth 2 (  two) times daily.   Yes [provider]  Valbenazine Tosylate 40 MG CAPS Take 40 mg by mouth daily.   Yes [provider]  Glucagon, rDNA, (GLUCAGON EMERGENCY) 1 MG KIT Inject as directed. Inject 1 mg intramuscularly every 24 hours as needed for low blood glucose blood glucose below 60 and unable to take oral glucose replacement.     [provider]  polyethylene glycol-electrolytes (NULYTELY) 420 g solution As directed 07/22/20   Daneil Dolin, MD    Physical Exam: Vitals:   09/02/20 1257 09/02/20 1300 09/02/20 1330 09/02/20 1435  BP: (!) 116/94 133/89 98/87 131/66  Pulse: (!) 125 (!) 125 (!) 126 (!) 121  Resp: (!) 25 19 (!) 24 (!) 28  Temp: (!) 101.2 F (38.4 C)   99.7 F (37.6 C)  TempSrc: Rectal   Oral  SpO2: 97% 95% 93% 99%  Weight:      Height:        Constitutional: Coughing significantly, having difficulty getting her words out, I am unable to check her O2 sats on her home 2 L. Vitals:   09/02/20 1257 09/02/20 1300 09/02/20 1330 09/02/20 1435  BP: (!) 116/94 133/89 98/87 131/66  Pulse: (!) 125 (!) 125 (!) 126 (!) 121  Resp: (!) 25 19 (!) 24 (!) 28  Temp: (!) 101.2 F (38.4 C)   99.7 F (37.6 C)  TempSrc: Rectal   Oral  SpO2: 97% 95% 93% 99%  Weight:      Height:       Eyes: PERRL, lids and conjunctivae normal ENMT: Mucous membranes are moist.  Neck: normal, supple, no masses, no thyromegaly Respiratory: Upper airway transmitted sounds, sounds slightly congested, no wheezing, no crackles.  Mild increased work of breathing due to coughing.  Cardiovascular: Tachycardic, regular rate and rhythm, no murmurs / rubs / gallops. No extremity edema. 2+ pedal pulses.  Abdomen: no tenderness, no masses palpated. No hepatosplenomegaly. Bowel sounds positive.  Musculoskeletal: no clubbing / cyanosis. No joint deformity upper and lower extremities. Good ROM, no contractures. Normal muscle tone.  Skin: no rashes, lesions, ulcers. No induration Neurologic: No Apparent cranial abnormality, moving extremities spontaneously. Psychiatric: Normal judgment and insight. Alert and oriented x 3. Normal mood.   Labs on Admission: I have personally reviewed following labs and imaging studies  CBC: Recent Labs  Lab 09/02/20 1133  WBC 9.1  NEUTROABS 7.3  HGB 11.1*  HCT 32.7*  MCV 110.1*  PLT 83*    Basic Metabolic Panel: Recent Labs  Lab 09/02/20 1133  NA 134*  K 3.2*  CL 99  CO2 23  GLUCOSE 211*  BUN 10  CREATININE 0.43*  CALCIUM 8.7*   Liver Function Tests: Recent Labs  Lab 09/02/20 1133  AST 12*  ALT 9  ALKPHOS 45  BILITOT 1.6*  PROT 6.7  ALBUMIN 3.1*   Recent Labs  Lab 09/02/20 1133  LIPASE 22   Urine analysis:    Component Value Date/Time   COLORURINE YELLOW 09/02/2020 1410   APPEARANCEUR HAZY (A) 09/02/2020 1410   LABSPEC 1.024 09/02/2020 1410   PHURINE 5.0 09/02/2020 1410   GLUCOSEU >=500 (A) 09/02/2020 1410   GLUCOSEU NEGATIVE 02/13/2015 1455   HGBUR NEGATIVE 09/02/2020 1410   BILIRUBINUR NEGATIVE 09/02/2020 1410   KETONESUR 80 (A) 09/02/2020 1410   PROTEINUR 100 (A) 09/02/2020 1410   UROBILINOGEN 0.2 02/13/2015 1455   NITRITE NEGATIVE 09/02/2020 1410   LEUKOCYTESUR NEGATIVE 09/02/2020 1410    Radiological Exams on Admission: CT  ABDOMEN PELVIS WO CONTRAST  Result Date: 09/02/2020 CLINICAL DATA:  70 year old female with history of acute nonlocalized abdominal pain. Shortness of breath and productive cough with yellow sputum. EXAM: CT CHEST, ABDOMEN AND PELVIS WITHOUT CONTRAST TECHNIQUE: Multidetector CT imaging of the chest, abdomen and pelvis was performed following the standard protocol without IV contrast. COMPARISON:  CT the abdomen and pelvis 10/01/2017. FINDINGS: CT CHEST FINDINGS Cardiovascular: Heart size is normal. There is no significant pericardial fluid, thickening or pericardial calcification. There is aortic atherosclerosis, as well as atherosclerosis of the great vessels of the mediastinum and the coronary arteries, including calcified atherosclerotic plaque in the left anterior descending and left circumflex coronary arteries. Mediastinum/Nodes: No pathologically enlarged mediastinal or hilar lymph nodes. Please note that accurate exclusion of hilar adenopathy is limited on noncontrast CT scans. Thickening of the distal esophagus. No  axillary lymphadenopathy. Lungs/Pleura: Extensive airspace consolidation noted in the lungs, most confluent in the right lower lobe but also involving the posterior aspect of the right upper lobe and left lower lobe. Small right pleural effusion. No definite suspicious appearing pulmonary nodules or masses are noted. Musculoskeletal: There are no aggressive appearing lytic or blastic lesions noted in the visualized portions of the skeleton. CT ABDOMEN PELVIS FINDINGS Hepatobiliary: No definite suspicious cystic or solid hepatic lesions are confidently identified on today's noncontrast CT examination. Status post cholecystectomy. Pancreas: No definite pancreatic mass or peripancreatic fluid collections or inflammatory changes are noted on today's noncontrast CT examination. Spleen: Unremarkable. Adrenals/Urinary Tract: Unenhanced appearance of the kidneys and bilateral adrenal glands is normal. No hydroureteronephrosis. Small amount of gas non dependently within the lumen of the urinary bladder. Urinary bladder is otherwise normal in appearance. Stomach/Bowel: Unenhanced appearance of the stomach is normal. There is no pathologic dilatation of small bowel or colon. Numerous colonic diverticulae are noted, without surrounding inflammatory changes to suggest an acute diverticulitis at this time. The appendix is not confidently identified and may be surgically absent. Regardless, there are no inflammatory changes noted adjacent to the cecum to suggest the presence of an acute appendicitis at this time. Vascular/Lymphatic: Aortic atherosclerosis. No lymphadenopathy noted in the abdomen or pelvis. Reproductive: Status post hysterectomy. Right ovary is atrophic. Low-attenuation lesions in the left ovary, decreased in size compared to the prior study, largest of which measures 4.2 x 2.7 cm (axial image 96 of series 2), incompletely characterize, but presumably benign given the slight regression. Other: No significant volume  of ascites.  No pneumoperitoneum. Musculoskeletal: Status post PLIF at L4-L5. There are no aggressive appearing lytic or blastic lesions noted in the visualized portions of the skeleton. IMPRESSION: 1. Multilobar bilateral pneumonia, most severe in the right lower lobe. Small parapneumonic right-sided pleural effusion. 2. Circumferential thickening of the distal third of the esophagus. This may simply reflect reflux esophagitis, however, if there is any clinical concern for esophageal metaplasia or neoplasia, further evaluation with nonemergent endoscopy should be considered. 3. No acute findings are noted in the abdomen or pelvis to account for the patient's symptoms. 4. Colonic diverticulosis without evidence of acute diverticulitis at this time. 5. Aortic atherosclerosis, in addition to left anterior descending and left circumflex coronary artery disease. Please note that although the presence of coronary artery calcium documents the presence of coronary artery disease, the severity of this disease and any potential stenosis cannot be assessed on this non-gated CT examination. Assessment for potential risk factor modification, dietary therapy or pharmacologic therapy may be warranted, if clinically indicated. Electronically Signed   By: Quillian Quince  Entrikin M.D.   On: 09/02/2020 14:49   DG Chest 1 View  Result Date: 09/02/2020 CLINICAL DATA:  Short of breath.  Productive cough. EXAM: CHEST  1 VIEW COMPARISON:  11/19/2019. FINDINGS: Cardiac silhouette is normal in size. No mediastinal or hilar masses. Mild linear opacity extends inferolaterally from the right hilum consistent with atelectasis. Mild prominence of the interstitial markings bilaterally, stable. Lungs otherwise clear. No convincing pleural effusion.  No pneumothorax. Skeletal structures are grossly intact. IMPRESSION: No acute cardiopulmonary disease. Electronically Signed   By: Lajean Manes M.D.   On: 09/02/2020 12:01   CT Chest Wo  Contrast  Result Date: 09/02/2020 CLINICAL DATA:  70 year old female with history of acute nonlocalized abdominal pain. Shortness of breath and productive cough with yellow sputum. EXAM: CT CHEST, ABDOMEN AND PELVIS WITHOUT CONTRAST TECHNIQUE: Multidetector CT imaging of the chest, abdomen and pelvis was performed following the standard protocol without IV contrast. COMPARISON:  CT the abdomen and pelvis 10/01/2017. FINDINGS: CT CHEST FINDINGS Cardiovascular: Heart size is normal. There is no significant pericardial fluid, thickening or pericardial calcification. There is aortic atherosclerosis, as well as atherosclerosis of the great vessels of the mediastinum and the coronary arteries, including calcified atherosclerotic plaque in the left anterior descending and left circumflex coronary arteries. Mediastinum/Nodes: No pathologically enlarged mediastinal or hilar lymph nodes. Please note that accurate exclusion of hilar adenopathy is limited on noncontrast CT scans. Thickening of the distal esophagus. No axillary lymphadenopathy. Lungs/Pleura: Extensive airspace consolidation noted in the lungs, most confluent in the right lower lobe but also involving the posterior aspect of the right upper lobe and left lower lobe. Small right pleural effusion. No definite suspicious appearing pulmonary nodules or masses are noted. Musculoskeletal: There are no aggressive appearing lytic or blastic lesions noted in the visualized portions of the skeleton. CT ABDOMEN PELVIS FINDINGS Hepatobiliary: No definite suspicious cystic or solid hepatic lesions are confidently identified on today's noncontrast CT examination. Status post cholecystectomy. Pancreas: No definite pancreatic mass or peripancreatic fluid collections or inflammatory changes are noted on today's noncontrast CT examination. Spleen: Unremarkable. Adrenals/Urinary Tract: Unenhanced appearance of the kidneys and bilateral adrenal glands is normal. No  hydroureteronephrosis. Small amount of gas non dependently within the lumen of the urinary bladder. Urinary bladder is otherwise normal in appearance. Stomach/Bowel: Unenhanced appearance of the stomach is normal. There is no pathologic dilatation of small bowel or colon. Numerous colonic diverticulae are noted, without surrounding inflammatory changes to suggest an acute diverticulitis at this time. The appendix is not confidently identified and may be surgically absent. Regardless, there are no inflammatory changes noted adjacent to the cecum to suggest the presence of an acute appendicitis at this time. Vascular/Lymphatic: Aortic atherosclerosis. No lymphadenopathy noted in the abdomen or pelvis. Reproductive: Status post hysterectomy. Right ovary is atrophic. Low-attenuation lesions in the left ovary, decreased in size compared to the prior study, largest of which measures 4.2 x 2.7 cm (axial image 96 of series 2), incompletely characterize, but presumably benign given the slight regression. Other: No significant volume of ascites.  No pneumoperitoneum. Musculoskeletal: Status post PLIF at L4-L5. There are no aggressive appearing lytic or blastic lesions noted in the visualized portions of the skeleton. IMPRESSION: 1. Multilobar bilateral pneumonia, most severe in the right lower lobe. Small parapneumonic right-sided pleural effusion. 2. Circumferential thickening of the distal third of the esophagus. This may simply reflect reflux esophagitis, however, if there is any clinical concern for esophageal metaplasia or neoplasia, further evaluation with nonemergent endoscopy should  be considered. 3. No acute findings are noted in the abdomen or pelvis to account for the patient's symptoms. 4. Colonic diverticulosis without evidence of acute diverticulitis at this time. 5. Aortic atherosclerosis, in addition to left anterior descending and left circumflex coronary artery disease. Please note that although the presence  of coronary artery calcium documents the presence of coronary artery disease, the severity of this disease and any potential stenosis cannot be assessed on this non-gated CT examination. Assessment for potential risk factor modification, dietary therapy or pharmacologic therapy may be warranted, if clinically indicated. Electronically Signed   By: Vinnie Langton M.D.   On: 09/02/2020 14:49    EKG: Sinus tachycardia rate 120, QTc 423.  No significant change from prior.  Assessment/Plan Principal Problem:   Pneumonia Active Problems:   Essential hypertension   Hypothyroidism   Asthma, moderate persistent, poorly-controlled   Obstructive sleep apnea   Type II diabetes mellitus with manifestations ([name])   Thrombocytopenia ([name])    Multilobar bilateral pneumonia with sepsis- dyspneic, significant productive cough, history of aspiration pneumonia, endorses difficulty swallowing, meets sepsis criteria with tachycardia to 126, and fever of 101.2.  Lactic acid normal 1.  Chest CT without contrast shows multilobar bilateral pneumonia most severe in the right lower lobe, no parapneumonic right-sided pleural effusion.  COVID and influenza test negative. -Start IV Unasyn for aspiration pneumonia,and add IV azithromycin for atypical coverage -BMP, CBC  -Duonebs, supplemental O2, -Remain n.p.o. -Speech therapy evaluation - N/s + 40 kcl 100cc/hr x 15hrs  Hypokalemia - k 3.2 - Replete  -Check magnesium  Thrombocytopenia-platelets 83, baseline over the past year 82-128. -SCDs for DVT prophylaxis  Esophageal wall thickening-Per CT chest, circumferential thickening of the distal third of the esophagus differentials reflux esophagitis versus esophageal metaplasia or neoplasia, further evaluation with nonemergent endoscopy should be considered.  Last EGD by Dr. Gala Romney 2020 - mild [name] ring and erosive gastropathy. -Follow-up as outpatient with her gastroenterologist  Asthma, obstructive sleep  apnea, chronic respiratory failure-on 2 L at baseline, currently on 4 L with saturations 93 to 97%. -DuoNebs as needed and scheduled, resume home bronchodilators  Hypertension-blood pressure systolic currently soft at 102. -Hold metoprolol for now  Diabetes mellitus-random glucose 211 - SSI- S q4h - HgbA1c - Hold home Lantus at reduced dose 10 units nightly(home dose 15 units)  Seizures-  -Resume home Depakote as IV valproic acid 500 mg 3 times daily  Bipolar disorder -Hold home sertraline and Seroquel for now while n.p.o. pending swallow evaluation  Hypothyroidism - Hold home Synthroid  Tremor- deep brain stimulator.  Sees [name]. -Resume primidone when able   DVT prophylaxis: SCDs Code Status: Full code Family Communication: None at bedside Disposition Plan: ~/> 2 days Consults called: None Admission status: Inpt, tele I certify that at the point of admission it is my clinical judgment that the patient will require inpatient hospital care spanning beyond 2 midnights from the point of admission due to high intensity of service, high risk for further deterioration and high frequency of surveillance required.     Bethena Roys MD [facility]  09/02/2020, 6:46 PM

## 2020-09-02 NOTE — ED Notes (Signed)
Pt back from ct, 2 abx complete, flagyl started.  Pt remains on cm and O2 sat monitor.

## 2020-09-03 ENCOUNTER — Encounter (HOSPITAL_COMMUNITY): Payer: Self-pay | Admitting: Internal Medicine

## 2020-09-03 DIAGNOSIS — D696 Thrombocytopenia, unspecified: Secondary | ICD-10-CM

## 2020-09-03 DIAGNOSIS — G4733 Obstructive sleep apnea (adult) (pediatric): Secondary | ICD-10-CM

## 2020-09-03 DIAGNOSIS — E038 Other specified hypothyroidism: Secondary | ICD-10-CM | POA: Diagnosis not present

## 2020-09-03 DIAGNOSIS — I10 Essential (primary) hypertension: Secondary | ICD-10-CM | POA: Diagnosis not present

## 2020-09-03 DIAGNOSIS — J4540 Moderate persistent asthma, uncomplicated: Secondary | ICD-10-CM

## 2020-09-03 DIAGNOSIS — R509 Fever, unspecified: Secondary | ICD-10-CM

## 2020-09-03 DIAGNOSIS — R0602 Shortness of breath: Secondary | ICD-10-CM

## 2020-09-03 DIAGNOSIS — R000 Tachycardia, unspecified: Secondary | ICD-10-CM

## 2020-09-03 DIAGNOSIS — E118 Type 2 diabetes mellitus with unspecified complications: Secondary | ICD-10-CM

## 2020-09-03 LAB — RESPIRATORY PANEL BY PCR

## 2020-09-03 LAB — MAGNESIUM: Magnesium: 1.8 mg/dL (ref 1.7–2.4)

## 2020-09-03 LAB — GLUCOSE, CAPILLARY
Glucose-Capillary: 165 mg/dL — ABNORMAL HIGH (ref 70–99)
Glucose-Capillary: 170 mg/dL — ABNORMAL HIGH (ref 70–99)
Glucose-Capillary: 175 mg/dL — ABNORMAL HIGH (ref 70–99)
Glucose-Capillary: 182 mg/dL — ABNORMAL HIGH (ref 70–99)
Glucose-Capillary: 227 mg/dL — ABNORMAL HIGH (ref 70–99)
Glucose-Capillary: 233 mg/dL — ABNORMAL HIGH (ref 70–99)

## 2020-09-03 LAB — CBC
HCT: 29.8 % — ABNORMAL LOW (ref 36.0–46.0)
Hemoglobin: 9.6 g/dL — ABNORMAL LOW (ref 12.0–15.0)
MCH: 36.8 pg — ABNORMAL HIGH (ref 26.0–34.0)
MCHC: 32.2 g/dL (ref 30.0–36.0)
MCV: 114.2 fL — ABNORMAL HIGH (ref 80.0–100.0)
Platelets: 71 10*3/uL — ABNORMAL LOW (ref 150–400)
RBC: 2.61 MIL/uL — ABNORMAL LOW (ref 3.87–5.11)
RDW: 13.8 % (ref 11.5–15.5)
WBC: 6.2 10*3/uL (ref 4.0–10.5)
nRBC: 0 % (ref 0.0–0.2)

## 2020-09-03 LAB — HEMOGLOBIN A1C
Hgb A1c MFr Bld: 5.6 % (ref 4.8–5.6)
Mean Plasma Glucose: 114 mg/dL

## 2020-09-03 LAB — BASIC METABOLIC PANEL
Anion gap: 7 (ref 5–15)
BUN: 7 mg/dL — ABNORMAL LOW (ref 8–23)
CO2: 27 mmol/L (ref 22–32)
Calcium: 8.1 mg/dL — ABNORMAL LOW (ref 8.9–10.3)
Chloride: 106 mmol/L (ref 98–111)
Creatinine, Ser: 0.44 mg/dL (ref 0.44–1.00)
GFR, Estimated: >60 mL/min (ref >60)
Glucose, Bld: 160 mg/dL — ABNORMAL HIGH (ref 70–99)
Potassium: 3.4 mmol/L — ABNORMAL LOW (ref 3.5–5.1)
Sodium: 140 mmol/L (ref 135–145)

## 2020-09-03 LAB — MRSA PCR SCREENING: MRSA by PCR: POSITIVE — AB

## 2020-09-03 LAB — EXPECTORATED SPUTUM ASSESSMENT W GRAM STAIN, RFLX TO RESP C

## 2020-09-03 LAB — POTASSIUM: Potassium: 3.7 mmol/L (ref 3.5–5.1)

## 2020-09-03 LAB — CBG MONITORING, ED: Glucose-Capillary: 172 mg/dL — ABNORMAL HIGH (ref 70–99)

## 2020-09-03 MED ORDER — INSULIN ASPART 100 UNIT/ML IJ SOLN
0.0000 [IU] | INTRAMUSCULAR | Status: DC
  Administered 2020-09-03 – 2020-09-04 (×5): 5 [IU] via SUBCUTANEOUS
  Administered 2020-09-04: 8 [IU] via SUBCUTANEOUS
  Administered 2020-09-04: 5 [IU] via SUBCUTANEOUS

## 2020-09-03 MED ORDER — VALPROATE SODIUM 500 MG/5ML IV SOLN
INTRAVENOUS | Status: AC
  Filled 2020-09-03: qty 5

## 2020-09-03 MED ORDER — MUPIROCIN 2 % EX OINT
1.0000 "application " | TOPICAL_OINTMENT | Freq: Two times a day (BID) | CUTANEOUS | Status: DC
  Administered 2020-09-03 – 2020-09-06 (×7): 1 via NASAL
  Filled 2020-09-03 (×2): qty 22

## 2020-09-03 MED ORDER — POTASSIUM CHLORIDE 10 MEQ/100ML IV SOLN
10.0000 meq | INTRAVENOUS | Status: AC
  Administered 2020-09-03 (×6): 10 meq via INTRAVENOUS
  Filled 2020-09-03 (×6): qty 100

## 2020-09-03 MED ORDER — CHLORHEXIDINE GLUCONATE CLOTH 2 % EX PADS
6.0000 | MEDICATED_PAD | Freq: Every day | CUTANEOUS | Status: DC
  Administered 2020-09-03 – 2020-09-06 (×4): 6 via TOPICAL

## 2020-09-03 MED ORDER — METHYLPREDNISOLONE SODIUM SUCC 125 MG IJ SOLR
60.0000 mg | Freq: Two times a day (BID) | INTRAMUSCULAR | Status: DC
  Administered 2020-09-03 – 2020-09-06 (×7): 60 mg via INTRAVENOUS
  Filled 2020-09-03 (×7): qty 2

## 2020-09-03 MED ORDER — IPRATROPIUM-ALBUTEROL 0.5-2.5 (3) MG/3ML IN SOLN
3.0000 mL | Freq: Four times a day (QID) | RESPIRATORY_TRACT | Status: DC
  Administered 2020-09-03 – 2020-09-04 (×4): 3 mL via RESPIRATORY_TRACT
  Filled 2020-09-03 (×5): qty 3

## 2020-09-03 MED ORDER — DILTIAZEM HCL-DEXTROSE 125-5 MG/125ML-% IV SOLN (PREMIX)
5.0000 mg/h | INTRAVENOUS | Status: DC
  Administered 2020-09-03: 5 mg/h via INTRAVENOUS
  Administered 2020-09-03: 10 mg/h via INTRAVENOUS
  Filled 2020-09-03 (×3): qty 125

## 2020-09-03 NOTE — Progress Notes (Signed)
   09/03/20 0133  Assess: MEWS Score  Temp 98.7 F (37.1 C)  BP (!) 125/52  Pulse Rate (!) 118  Resp 20  SpO2 97 %  O2 Device Nasal Cannula  O2 Flow Rate (L/min) 4 L/min  Assess: MEWS Score  MEWS Temp 0  MEWS Systolic 0  MEWS Pulse 2  MEWS RR 0  MEWS LOC 0  MEWS Score 2  MEWS Score Color Yellow  Assess: if the MEWS score is Yellow or Red  Were vital signs taken at a resting state? Yes  Focused Assessment No change from prior assessment  Early Detection of Sepsis Score *See Row Information* Medium  MEWS guidelines implemented *See Row Information* Yes  Treat  MEWS Interventions Other (Comment) (provider aware of tachycardia in the 110s)  Pain Scale 0-10  Pain Score 0  Take Vital Signs  Increase Vital Sign Frequency  Yellow: Q 2hr X 2 then Q 4hr X 2, if remains yellow, continue Q 4hrs  Escalate  MEWS: Escalate Yellow: discuss with charge nurse/RN and consider discussing with provider and RRT  Notify: Charge Nurse/RN  Name of Charge Nurse/RN Notified heather evans, RN (notified her since I am charge RN tonight)  Date Charge Nurse/RN Notified 09/03/20  Time Charge Nurse/RN Notified 0133  Notify: Provider  Provider Name/Title Dr. Clearence Ped  Date Provider Notified 09/03/20  Time Provider Notified 0159  Notification Type  (secrue chat since patient was already yellow MEWS with tachy provider aware of in the ED prior to admissin)  Notification Reason Other (Comment) (yellow MEWS, tachy in 110s at baseline for admiission)  Provider response No new orders

## 2020-09-03 NOTE — Progress Notes (Signed)
Pt BP: 138/98 HR 125 O2 76% on 3L [place]. No complaints of SOB.  MD called informed of HR intermittently running in the 130s V TACH with paired PVCs back to ST 110s and yellow mews. MD ordered Cardizem drip, STAT EKG, Respiratory panel, and Sputum cultures to be done. Charge nurse updated and informed. Pt will need to be transferred to ICU for drip. Unasyn infusing. Pt has very congested cough with thick/frothy sputum clear/yellow in color. Respiratory called came to beside breathing treatment given and adjusted O2 down to 4L and patient maintained @ 100%. Will continue to monitor patient closely.

## 2020-09-03 NOTE — Progress Notes (Signed)
   09/03/20 0534  Provider Notification  Provider Name/Title Dr. Clearence Ped  Date Provider Notified 09/03/20  Time Provider Notified [phone]  Notification Type  (secure chat  message)  Notification Reason Other (Comment) (MRSA PCR positive)  Provider response No new orders  Date of Provider Response 09/03/20  Time of Provider Response [phone]

## 2020-09-03 NOTE — Progress Notes (Signed)
   09/03/20 0240  Notify: Provider  Provider response No new orders (monitor and notify if patient worsens or becomes symptomatic with tachycardia)  Date of Provider Response 09/03/20  Time of Provider Response 0240  Document  Patient Outcome Other (Comment) (remains at baseline)  Progress note created (see row info) Yes

## 2020-09-03 NOTE — Progress Notes (Signed)
PROGRESS NOTE    [name]  TGG:[id number] DOB: Sep 12, 1950 DOA: 09/02/2020 PCP: Abran Richard, MD     Brief Narrative:   70 y.o. WF PMHx Depression, COPD, asthma with chronic respiratory failure on 2 L O2 at home, OSA depression, HTN, thrombocytopenia, aspiration pneumonia, tremor with deep brain stimulator.  Patient presented to the ED for complaints of difficulty breathing, with cough productive of yellowish sputum.  Patient is able to answer very few questions, as at the time of my evaluation, patient starts coughing significantly limiting further history.  Patient tells me she has been having difficulty swallowing liquids, for which she had a barium swallow done.  She has not had any problems with solids.  This is a chronic issue for patient.  She denies vomiting. At the facility, patient's O2 sat had to be increased to 4 L  ED Course: Febrile - 101.2, tachycardic to 126, respiratory rate 18-28, blood pressure systolic 70-350.  O2 sats 93 - 97% on 4 L.  WBC 9.1.  Lactic acid 1.  UA ketones protein and rare bacteria.  Chest CT shows multilobar bilateral pneumonia most severe in the right lower lobe.  Small parapneumonic right-sided pleural effusion. IV vancomycin cefepime and metronidazole started.  1 L bolus given and Ringer's lactate at 150 cc/h started.  Hospitalist to admit.   Subjective: T-max overnight 38.4 C, A/O x4, dysarthria, tremor.   Assessment & Plan: Covid vaccination; vaccinated 3/3   Principal Problem:   Pneumonia Active Problems:   Essential hypertension   Hypothyroidism   Asthma, moderate persistent, poorly-controlled   Obstructive sleep apnea   Type II diabetes mellitus with manifestations ([name])   Thrombocytopenia ([name])  Multilobar bilateral pneumonia with sepsis-  -upon admission met criteria for sepsis: HR> 90, RR > 20, T> 38 C, site of infection lungs.  P - Chest CT without contrast shows multilobar bilateral pneumonia most severe in the  right lower lobe, no parapneumonic right-sided pleural effusion.  COVID and influenza test negative. -Start IV Unasyn for aspiration pneumonia,and add IV azithromycin for atypical coverage - 0.9% saline with KCl 40 mEq 128m/hr -DuoNeb - Flutter valve - Incentive spirometry - Solu-Medrol 60 mg BID -Acute respiratory panel - 5/31 sputum pending - Swallow study pending patient most likely aspirating  Hypokalemia -Potassium goal 4 -Potassium IV 60 mEq - Recheck K/Mg '@1600'   Thrombocytopenia (chronic) - Baseline platelets 82-128 - Currently no obvious sites of bleeding but will need to monitor closely  Esophageal wall thickening -Per CT chest, circumferential thickening of the distal third of the esophagus differentials reflux esophagitis versus esophageal metaplasia or neoplasia, further evaluation with nonemergent endoscopy should be considered.  Last EGD by Dr. RGala Romney2020  - mild [name] ring and erosive gastropathy. -Follow-up as outpatient with her gastroenterologist  Asthma, obstructive sleep apnea, chronic respiratory failure- -On 2 L O2 per [place] at baseline, currently on 4 L with saturations 93 to 97%. -DuoNebs as needed and scheduled, resume home bronchodilators -Titrate O2 to maintain SPO2> 92%  Hypertension -blood pressure systolic currently soft at 102. -Hold metoprolol for now  A. fib RVR  Vs sinus tachycardia - Obtain EKG twelve-lead - Cardizem drip  DM type II uncontrolled with complication -60/9/3818hemoglobin A1c SSI= 7.4- - 5/31 hemoglobin A1c pending - 5/31 lipid panel pending - Moderate SSI  Seizures-  - Depacon 500 mg TID  Bipolar disorder -Hold home sertraline and Seroquel for now while n.p.o. pending swallow evaluation  Hypothyroidism -Hold Synthroid until patient passes swallow evaluation  Tremor-  -Deep brain stimulator.  Sees [name]. -Resume primidone when able      DVT prophylaxis: SCD Code Status: Full Family Communication:   Status is: Inpatient    Dispo: The patient is from: Home              Anticipated d/c is to: Home              Anticipated d/c date is: 6/3              Patient currently unstable      Consultants:    Procedures/Significant Events:  1/30 okay CTabdomen and chest  WO contrast Multilobar bilateral pneumonia, most severe in the right lower lobe. Small parapneumonic right-sided pleural effusion. 2. Circumferential thickening of the distal third of the esophagus. This may simply reflect reflux esophagitis, however, if there is any clinical concern for esophageal metaplasia or neoplasia, further evaluation with nonemergent endoscopy should be considered. 3. No acute findings are noted in the abdomen or pelvis to account for the patient's symptoms. 4. Colonic diverticulosis without evidence of acute diverticulitis at this time. 5. Aortic atherosclerosis, in addition to left anterior descending and left circumflex coronary artery disease. Please note that although the presence of coronary artery calcium documents the presence of coronary artery disease, the severity of this disease and any potential stenosis cannot be assessed on this non-gated CT examination. Assessment for potential risk factor modification, dietary therapy or pharmacologic therapy may be warranted, if clinically indicated.   I have personally reviewed and interpreted all radiology studies and my findings are as above.  VENTILATOR SETTINGS: Nasal cannula 5/31 Flow 4 L/min SPO2 97%   Cultures   Antimicrobials: Anti-infectives (From admission, onward)   Start     Ordered Stop   09/02/20 2200  ceFEPIme (MAXIPIME) 2 g in sodium chloride 0.9 % 100 mL IVPB  Status:  Discontinued        09/02/20 1304 09/02/20 1659   09/02/20 2100  vancomycin (VANCOCIN) IVPB 1000 mg/200 mL premix  Status:  Discontinued        09/02/20 1304 09/02/20 1659   09/02/20 1900  azithromycin (ZITHROMAX) 500 mg in sodium chloride  0.9 % 250 mL IVPB        09/02/20 1844     09/02/20 1730  Ampicillin-Sulbactam (UNASYN) 3 g in sodium chloride 0.9 % 100 mL IVPB        09/02/20 1710     09/02/20 1315  ceFEPIme (MAXIPIME) 2 g in sodium chloride 0.9 % 100 mL IVPB        09/02/20 1302 09/02/20 1432   09/02/20 1315  metroNIDAZOLE (FLAGYL) IVPB 500 mg        09/02/20 1302 09/02/20 1534   09/02/20 1315  vancomycin (VANCOCIN) IVPB 1000 mg/200 mL premix        09/02/20 1302 09/02/20 1432       Devices    LINES / TUBES:      Continuous Infusions: . 0.9 % NaCl with KCl 40 mEq / L 100 mL/hr at 09/03/20 0300  . ampicillin-sulbactam (UNASYN) IV 3 g (09/03/20 0452)  . azithromycin Stopped (09/02/20 2100)  . valproate sodium 500 mg (09/03/20 0621)     Objective: Vitals:   09/02/20 2330 09/03/20 0015 09/03/20 0133 09/03/20 0356  BP: (!) 106/54 (!) 130/41 (!) 125/52 (!) 100/48  Pulse: (!) 108  (!) 118 98  Resp:  '19 20 20  ' Temp:  98.9 F (37.2 C) 98.7 F (37.1  C) 98.5 F (36.9 C)  TempSrc:   Oral   SpO2: 98% 96% 97% 97%  Weight:      Height:        Intake/Output Summary (Last 24 hours) at 09/03/2020 9381 Last data filed at 09/03/2020 0300 Gross per 24 hour  Intake 4126.72 ml  Output --  Net 4126.72 ml   Filed Weights   09/02/20 1107  Weight: 80.6 kg    Examination:  General: A/O x4, positive acute respiratory distress Eyes: negative scleral hemorrhage, negative anisocoria, negative icterus ENT: Negative Runny nose, negative gingival bleeding, Neck:  Negative scars, masses, torticollis, lymphadenopathy, JVD Lungs: decreased breath sounds RLL greatest bilaterally without wheezes or crackles Cardiovascular: Regular rate and rhythm without murmur gallop or rub normal S1 and S2 Abdomen: negative abdominal pain, nondistended, positive soft, bowel sounds, no rebound, no ascites, no appreciable mass Extremities: No significant cyanosis, clubbing, or edema bilateral lower extremities Skin: Negative rashes,  lesions, ulcers Psychiatric:  Negative depression, negative anxiety, negative fatigue, negative mania  Central nervous system:  Cranial nerves II through XII intact, tongue/uvula midline, all extremities muscle strength 5/5, sensation intact throughout, positive dysarthria, negativeexpressive aphasia, negative receptive aphasia.  .     Data Reviewed: Care during the described time interval was provided by me .  I have reviewed this patient's available data, including medical history, events of note, physical examination, and all test results as part of my evaluation.  CBC: Recent Labs  Lab 09/02/20 1133 09/03/20 0611  WBC 9.1 6.2  NEUTROABS 7.3  --   HGB 11.1* 9.6*  HCT 32.7* 29.8*  MCV 110.1* 114.2*  PLT 83* 71*   Basic Metabolic Panel: Recent Labs  Lab 09/02/20 1133 09/02/20 1653 09/03/20 0611  NA 134*  --  140  K 3.2*  --  3.4*  CL 99  --  106  CO2 23  --  27  GLUCOSE 211*  --  160*  BUN 10  --  7*  CREATININE 0.43*  --  0.44  CALCIUM 8.7*  --  8.1*  MG  --  1.6*  --    GFR: Estimated Creatinine Clearance: 66.3 mL/min (by C-G formula based on SCr of 0.44 mg/dL). Liver Function Tests: Recent Labs  Lab 09/02/20 1133  AST 12*  ALT 9  ALKPHOS 45  BILITOT 1.6*  PROT 6.7  ALBUMIN 3.1*   Recent Labs  Lab 09/02/20 1133  LIPASE 22   No results for input(s): AMMONIA in the last 168 hours. Coagulation Profile: No results for input(s): INR, PROTIME in the last 168 hours. Cardiac Enzymes: No results for input(s): CKTOTAL, CKMB, CKMBINDEX, TROPONINI in the last 168 hours. BNP (last 3 results) No results for input(s): PROBNP in the last 8760 hours. HbA1C: No results for input(s): HGBA1C in the last 72 hours. CBG: Recent Labs  Lab 09/02/20 2020 09/03/20 0042 09/03/20 0307  GLUCAP 182* 172* 170*   Lipid Profile: No results for input(s): CHOL, HDL, LDLCALC, TRIG, CHOLHDL, LDLDIRECT in the last 72 hours. Thyroid Function Tests: No results for input(s): TSH,  T4TOTAL, FREET4, T3FREE, THYROIDAB in the last 72 hours. Anemia Panel: No results for input(s): VITAMINB12, FOLATE, FERRITIN, TIBC, IRON, RETICCTPCT in the last 72 hours. Sepsis Labs: Recent Labs  Lab 09/02/20 1135  LATICACIDVEN 1.0    Recent Results (from the past 240 hour(s))  Resp Panel by RT-PCR (Flu A&B, Covid) Nasopharyngeal Swab     Status: None   Collection Time: 09/02/20 10:59 AM   Specimen:  Nasopharyngeal Swab; Nasopharyngeal(NP) swabs in vial transport medium  Result Value Ref Range Status   SARS Coronavirus 2 by RT PCR NEGATIVE NEGATIVE Final    Comment: (NOTE) SARS-CoV-2 target nucleic acids are NOT DETECTED.  The SARS-CoV-2 RNA is generally detectable in upper respiratory specimens during the acute phase of infection. The lowest concentration of SARS-CoV-2 viral copies this assay can detect is 138 copies/mL. A negative result does not preclude SARS-Cov-2 infection and should not be used as the sole basis for treatment or other patient management decisions. A negative result may occur with  improper specimen collection/handling, submission of specimen other than nasopharyngeal swab, presence of viral mutation(s) within the areas targeted by this assay, and inadequate number of viral copies(<138 copies/mL). A negative result must be combined with clinical observations, patient history, and epidemiological information. The expected result is Negative.  Fact Sheet for Patients:  EntrepreneurPulse.com.au  Fact Sheet for Healthcare Providers:  IncredibleEmployment.be  This test is no t yet approved or cleared by the [place] FDA and  has been authorized for detection and/or diagnosis of SARS-CoV-2 by FDA under an Emergency Use Authorization (EUA). This EUA will remain  in effect (meaning this test can be used) for the duration of the COVID-19 declaration under Section 564(b)(1) of the Act, 21 U.S.C.section 360bbb-3(b)(1),  unless the authorization is terminated  or revoked sooner.       Influenza A by PCR NEGATIVE NEGATIVE Final   Influenza B by PCR NEGATIVE NEGATIVE Final    Comment: (NOTE) The Xpert Xpress SARS-CoV-2/FLU/RSV plus assay is intended as an aid in the diagnosis of influenza from Nasopharyngeal swab specimens and should not be used as a sole basis for treatment. Nasal washings and aspirates are unacceptable for Xpert Xpress SARS-CoV-2/FLU/RSV testing.  Fact Sheet for Patients: EntrepreneurPulse.com.au  Fact Sheet for Healthcare Providers: IncredibleEmployment.be  This test is not yet approved or cleared by the [place] FDA and has been authorized for detection and/or diagnosis of SARS-CoV-2 by FDA under an Emergency Use Authorization (EUA). This EUA will remain in effect (meaning this test can be used) for the duration of the COVID-19 declaration under Section 564(b)(1) of the Act, 21 U.S.C. section 360bbb-3(b)(1), unless the authorization is terminated or revoked.  Performed at [facility], [address] [place], [place] [address]   Blood Culture (routine x 2)     Status: None (Preliminary result)   Collection Time: 09/02/20  1:16 PM   Specimen: BLOOD RIGHT WRIST  Result Value Ref Range Status   Specimen Description   Final    BLOOD RIGHT WRIST BOTTLES DRAWN AEROBIC AND ANAEROBIC   Special Requests Blood Culture adequate volume  Final   Culture   Final    NO GROWTH < 24 HOURS Performed at [facility], [address] [place], [place] [address]    Report Status PENDING  Incomplete  Blood Culture (routine x 2)     Status: None (Preliminary result)   Collection Time: 09/02/20  1:16 PM   Specimen: Right Antecubital; Blood  Result Value Ref Range Status   Specimen Description   Final    RIGHT ANTECUBITAL BOTTLES DRAWN AEROBIC AND ANAEROBIC   Special Requests Blood Culture adequate volume  Final   Culture   Final    NO GROWTH < 24  HOURS Performed at [facility], [address], [place], [place] [address]    Report Status PENDING  Incomplete  MRSA PCR Screening     Status: Abnormal   Collection  Time: 09/03/20  3:34 AM   Specimen: Nasopharyngeal  Result Value Ref Range Status   MRSA by PCR POSITIVE (A) NEGATIVE Final    Comment:        The GeneXpert MRSA Assay (FDA approved for NASAL specimens only), is one component of a comprehensive MRSA colonization surveillance program. It is not intended to diagnose MRSA infection nor to guide or monitor treatment for MRSA infections. RESULT CALLED TO, READ BACK BY AND VERIFIED WITH: A ROGERS,RN'@[id number]'  09/03/20 [facility] Performed at [facility], [address], [place], [place] [address]          Radiology Studies: CT ABDOMEN PELVIS WO CONTRAST  Result Date: 09/02/2020 CLINICAL DATA:  70 year old female with history of acute nonlocalized abdominal pain. Shortness of breath and productive cough with yellow sputum. EXAM: CT CHEST, ABDOMEN AND PELVIS WITHOUT CONTRAST TECHNIQUE: Multidetector CT imaging of the chest, abdomen and pelvis was performed following the standard protocol without IV contrast. COMPARISON:  CT the abdomen and pelvis 10/01/2017. FINDINGS: CT CHEST FINDINGS Cardiovascular: Heart size is normal. There is no significant pericardial fluid, thickening or pericardial calcification. There is aortic atherosclerosis, as well as atherosclerosis of the great vessels of the mediastinum and the coronary arteries, including calcified atherosclerotic plaque in the left anterior descending and left circumflex coronary arteries. Mediastinum/Nodes: No pathologically enlarged mediastinal or hilar lymph nodes. Please note that accurate exclusion of hilar adenopathy is limited on noncontrast CT scans. Thickening of the distal esophagus. No axillary lymphadenopathy. Lungs/Pleura: Extensive airspace consolidation noted in the lungs, most confluent in the right lower lobe but also  involving the posterior aspect of the right upper lobe and left lower lobe. Small right pleural effusion. No definite suspicious appearing pulmonary nodules or masses are noted. Musculoskeletal: There are no aggressive appearing lytic or blastic lesions noted in the visualized portions of the skeleton. CT ABDOMEN PELVIS FINDINGS Hepatobiliary: No definite suspicious cystic or solid hepatic lesions are confidently identified on today's noncontrast CT examination. Status post cholecystectomy. Pancreas: No definite pancreatic mass or peripancreatic fluid collections or inflammatory changes are noted on today's noncontrast CT examination. Spleen: Unremarkable. Adrenals/Urinary Tract: Unenhanced appearance of the kidneys and bilateral adrenal glands is normal. No hydroureteronephrosis. Small amount of gas non dependently within the lumen of the urinary bladder. Urinary bladder is otherwise normal in appearance. Stomach/Bowel: Unenhanced appearance of the stomach is normal. There is no pathologic dilatation of small bowel or colon. Numerous colonic diverticulae are noted, without surrounding inflammatory changes to suggest an acute diverticulitis at this time. The appendix is not confidently identified and may be surgically absent. Regardless, there are no inflammatory changes noted adjacent to the cecum to suggest the presence of an acute appendicitis at this time. Vascular/Lymphatic: Aortic atherosclerosis. No lymphadenopathy noted in the abdomen or pelvis. Reproductive: Status post hysterectomy. Right ovary is atrophic. Low-attenuation lesions in the left ovary, decreased in size compared to the prior study, largest of which measures 4.2 x 2.7 cm (axial image 96 of series 2), incompletely characterize, but presumably benign given the slight regression. Other: No significant volume of ascites.  No pneumoperitoneum. Musculoskeletal: Status post PLIF at L4-L5. There are no aggressive appearing lytic or blastic lesions  noted in the visualized portions of the skeleton. IMPRESSION: 1. Multilobar bilateral pneumonia, most severe in the right lower lobe. Small parapneumonic right-sided pleural effusion. 2. Circumferential thickening of the distal third of the esophagus. This may simply reflect reflux esophagitis, however, if there is any clinical concern for esophageal metaplasia or neoplasia,  further evaluation with nonemergent endoscopy should be considered. 3. No acute findings are noted in the abdomen or pelvis to account for the patient's symptoms. 4. Colonic diverticulosis without evidence of acute diverticulitis at this time. 5. Aortic atherosclerosis, in addition to left anterior descending and left circumflex coronary artery disease. Please note that although the presence of coronary artery calcium documents the presence of coronary artery disease, the severity of this disease and any potential stenosis cannot be assessed on this non-gated CT examination. Assessment for potential risk factor modification, dietary therapy or pharmacologic therapy may be warranted, if clinically indicated. Electronically Signed   By: Vinnie Langton M.D.   On: 09/02/2020 14:49   DG Chest 1 View  Result Date: 09/02/2020 CLINICAL DATA:  Short of breath.  Productive cough. EXAM: CHEST  1 VIEW COMPARISON:  11/19/2019. FINDINGS: Cardiac silhouette is normal in size. No mediastinal or hilar masses. Mild linear opacity extends inferolaterally from the right hilum consistent with atelectasis. Mild prominence of the interstitial markings bilaterally, stable. Lungs otherwise clear. No convincing pleural effusion.  No pneumothorax. Skeletal structures are grossly intact. IMPRESSION: No acute cardiopulmonary disease. Electronically Signed   By: Lajean Manes M.D.   On: 09/02/2020 12:01   CT Chest Wo Contrast  Result Date: 09/02/2020 CLINICAL DATA:  70 year old female with history of acute nonlocalized abdominal pain. Shortness of breath and  productive cough with yellow sputum. EXAM: CT CHEST, ABDOMEN AND PELVIS WITHOUT CONTRAST TECHNIQUE: Multidetector CT imaging of the chest, abdomen and pelvis was performed following the standard protocol without IV contrast. COMPARISON:  CT the abdomen and pelvis 10/01/2017. FINDINGS: CT CHEST FINDINGS Cardiovascular: Heart size is normal. There is no significant pericardial fluid, thickening or pericardial calcification. There is aortic atherosclerosis, as well as atherosclerosis of the great vessels of the mediastinum and the coronary arteries, including calcified atherosclerotic plaque in the left anterior descending and left circumflex coronary arteries. Mediastinum/Nodes: No pathologically enlarged mediastinal or hilar lymph nodes. Please note that accurate exclusion of hilar adenopathy is limited on noncontrast CT scans. Thickening of the distal esophagus. No axillary lymphadenopathy. Lungs/Pleura: Extensive airspace consolidation noted in the lungs, most confluent in the right lower lobe but also involving the posterior aspect of the right upper lobe and left lower lobe. Small right pleural effusion. No definite suspicious appearing pulmonary nodules or masses are noted. Musculoskeletal: There are no aggressive appearing lytic or blastic lesions noted in the visualized portions of the skeleton. CT ABDOMEN PELVIS FINDINGS Hepatobiliary: No definite suspicious cystic or solid hepatic lesions are confidently identified on today's noncontrast CT examination. Status post cholecystectomy. Pancreas: No definite pancreatic mass or peripancreatic fluid collections or inflammatory changes are noted on today's noncontrast CT examination. Spleen: Unremarkable. Adrenals/Urinary Tract: Unenhanced appearance of the kidneys and bilateral adrenal glands is normal. No hydroureteronephrosis. Small amount of gas non dependently within the lumen of the urinary bladder. Urinary bladder is otherwise normal in appearance.  Stomach/Bowel: Unenhanced appearance of the stomach is normal. There is no pathologic dilatation of small bowel or colon. Numerous colonic diverticulae are noted, without surrounding inflammatory changes to suggest an acute diverticulitis at this time. The appendix is not confidently identified and may be surgically absent. Regardless, there are no inflammatory changes noted adjacent to the cecum to suggest the presence of an acute appendicitis at this time. Vascular/Lymphatic: Aortic atherosclerosis. No lymphadenopathy noted in the abdomen or pelvis. Reproductive: Status post hysterectomy. Right ovary is atrophic. Low-attenuation lesions in the left ovary, decreased in size compared to  the prior study, largest of which measures 4.2 x 2.7 cm (axial image 96 of series 2), incompletely characterize, but presumably benign given the slight regression. Other: No significant volume of ascites.  No pneumoperitoneum. Musculoskeletal: Status post PLIF at L4-L5. There are no aggressive appearing lytic or blastic lesions noted in the visualized portions of the skeleton. IMPRESSION: 1. Multilobar bilateral pneumonia, most severe in the right lower lobe. Small parapneumonic right-sided pleural effusion. 2. Circumferential thickening of the distal third of the esophagus. This may simply reflect reflux esophagitis, however, if there is any clinical concern for esophageal metaplasia or neoplasia, further evaluation with nonemergent endoscopy should be considered. 3. No acute findings are noted in the abdomen or pelvis to account for the patient's symptoms. 4. Colonic diverticulosis without evidence of acute diverticulitis at this time. 5. Aortic atherosclerosis, in addition to left anterior descending and left circumflex coronary artery disease. Please note that although the presence of coronary artery calcium documents the presence of coronary artery disease, the severity of this disease and any potential stenosis cannot be  assessed on this non-gated CT examination. Assessment for potential risk factor modification, dietary therapy or pharmacologic therapy may be warranted, if clinically indicated. Electronically Signed   By: Vinnie Langton M.D.   On: 09/02/2020 14:49        Scheduled Meds: . Chlorhexidine Gluconate Cloth  6 each Topical Q0600  . fluticasone furoate-vilanterol  1 puff Inhalation Daily  . insulin aspart  0-9 Units Subcutaneous Q6H  . levalbuterol  0.63 mg Nebulization Q6H  . mupirocin ointment  1 application Nasal BID   Continuous Infusions: . 0.9 % NaCl with KCl 40 mEq / L 100 mL/hr at 09/03/20 0300  . ampicillin-sulbactam (UNASYN) IV 3 g (09/03/20 0452)  . azithromycin Stopped (09/02/20 2100)  . valproate sodium 500 mg (09/03/20 0621)     LOS: 1 day    Time spent:40 min    [name], Geraldo Docker, MD [facility]   If 7PM-7AM, please contact night-coverage 09/03/2020, 8:33 AM

## 2020-09-03 NOTE — Progress Notes (Signed)
   09/03/20 1247  Vitals  BP (!) 138/98  MAP (mmHg) 111  BP Location Left Arm  BP Method Automatic  Patient Position (if appropriate) Sitting  Pulse Rate (!) 125  Pulse Rate Source Monitor  Resp 19  Level of Consciousness  Level of Consciousness Alert  MEWS COLOR  MEWS Score Color Yellow  Oxygen Therapy  SpO2 (!) 76 % (MD made aware increased to 5L 100% on that)  O2 Device Nasal Cannula  O2 Flow Rate (L/min) 3 L/min  MEWS Score  MEWS Temp 0  MEWS Systolic 0  MEWS Pulse 2  MEWS RR 0  MEWS LOC 0  MEWS Score 2  Provider Notification  Provider Name/Title Dr Sherral Hammers  Date Provider Notified 09/03/20  Time Provider Notified 5329  Notification Type Page Shea Evans)  Notification Reason Other (Comment) (Oxygen)  Provider response See new orders (apply 5L o2)  Date of Provider Response 09/03/20  Time of Provider Response 1251

## 2020-09-03 NOTE — Progress Notes (Signed)
Initial Nutrition Assessment  DOCUMENTATION CODES:   Not applicable  INTERVENTION:  Follow for diet advancement    NUTRITION DIAGNOSIS:   Inadequate oral intake related to acute and chronic  (acute pneumonia and chronic COPD, dysphagia) as evidenced by NPO status,mild muscle depletion (wt loss 4.7 kg the past 3 months).   GOAL:  Patient will meet greater than or equal to 90% of their needs   MONITOR:  Diet advancement,Supplement acceptance,Weight trends  REASON FOR ASSESSMENT:   Malnutrition Screening Tool    ASSESSMENT: patient is a 70 yo from [facility] who has a hx of COPD, asthma, Chronic O2 @ 2 liters, dysphagia, constipation, aspiration pneumonia. Presents with shortness of breath, productive cough.   Patient is NPO. She doesn't have her dentures with her. Recommend soft foods when she advances diet. Able to feed herself.   Weight: 80.6 kg per chart reflects a loss of 4.7 kg (6%) since February. At risk for malnutrition (chronic) given her unplanned wt loss and mild muscle/ fat loss.   Medications reviewed and include: insulin, solumedrol.   Labs: BMP Latest Ref Rng & Units 09/03/2020 09/02/2020 09/21/2019  Glucose 70 - 99 mg/dL 160(H) 211(H) -  BUN 8 - 23 mg/dL 7(L) 10 12  Creatinine 0.44 - 1.00 mg/dL 0.44 0.43(L) 0.5  Sodium 135 - 145 mmol/L 140 134(L) 136(A)  Potassium 3.5 - 5.1 mmol/L 3.4(L) 3.2(L) 3.9  Chloride 98 - 111 mmol/L 106 99 103  CO2 22 - 32 mmol/L 27 23 23(A)  Calcium 8.9 - 10.3 mg/dL 8.1(L) 8.7(L) 9.2     NUTRITION - FOCUSED PHYSICAL EXAM:  Flowsheet Row Most Recent Value  Orbital Region No depletion  Upper Arm Region Mild depletion  Thoracic and Lumbar Region No depletion  Buccal Region No depletion  Temple Region Mild depletion  Clavicle Bone Region No depletion  Clavicle and Acromion Bone Region Mild depletion  Dorsal Hand No depletion  Patellar Region Moderate depletion  Posterior Calf Region Mild depletion  Edema (RD Assessment) None  Hair  Reviewed  Eyes Reviewed  Mouth Reviewed  Skin Reviewed  Nails Reviewed      Diet Order:   Diet Order            Diet NPO time specified  Diet effective now                 EDUCATION NEEDS:  Education needs have been addressed  Skin:  Skin Assessment: Reviewed RN Assessment  Last BM:  5/30  Height:   Ht Readings from Last 1 Encounters:  09/02/20 5\' 4"  (1.626 m)    Weight:   Wt Readings from Last 1 Encounters:  09/02/20 80.6 kg    Ideal Body Weight:   55 kg  BMI:  Body mass index is 30.5 kg/m.  Estimated Nutritional Needs:   Kcal:  1500-1700  Protein:  82-88 gr  Fluid:  1.5-1.7 liters daily   Colman Cater MS,RD,CSG,LDN Contact: [email or web address]

## 2020-09-03 NOTE — Progress Notes (Signed)
Orders to transfer patient to ICU. Report called to [employer]. Transferred patient safely room 10 in the ICU. Dr Sherral Hammers at bedside there.

## 2020-09-04 ENCOUNTER — Inpatient Hospital Stay (HOSPITAL_COMMUNITY): Payer: 59

## 2020-09-04 DIAGNOSIS — J189 Pneumonia, unspecified organism: Secondary | ICD-10-CM | POA: Diagnosis not present

## 2020-09-04 DIAGNOSIS — E038 Other specified hypothyroidism: Secondary | ICD-10-CM | POA: Diagnosis not present

## 2020-09-04 DIAGNOSIS — J4540 Moderate persistent asthma, uncomplicated: Secondary | ICD-10-CM | POA: Diagnosis not present

## 2020-09-04 DIAGNOSIS — I10 Essential (primary) hypertension: Secondary | ICD-10-CM | POA: Diagnosis not present

## 2020-09-04 LAB — CBC WITH DIFFERENTIAL/PLATELET
Band Neutrophils: 7 %
Basophils Absolute: 0 10*3/uL (ref 0.0–0.1)
Basophils Relative: 0 %
Eosinophils Absolute: 0 10*3/uL (ref 0.0–0.5)
Eosinophils Relative: 0 %
HCT: 26.8 % — ABNORMAL LOW (ref 36.0–46.0)
Hemoglobin: 8.7 g/dL — ABNORMAL LOW (ref 12.0–15.0)
Lymphocytes Relative: 16 %
Lymphs Abs: 0.7 10*3/uL (ref 0.7–4.0)
MCH: 36.4 pg — ABNORMAL HIGH (ref 26.0–34.0)
MCHC: 32.5 g/dL (ref 30.0–36.0)
MCV: 112.1 fL — ABNORMAL HIGH (ref 80.0–100.0)
Metamyelocytes Relative: 1 %
Monocytes Absolute: 0 10*3/uL — ABNORMAL LOW (ref 0.1–1.0)
Monocytes Relative: 1 %
Myelocytes: 1 %
Neutro Abs: 3.5 10*3/uL (ref 1.7–7.7)
Neutrophils Relative %: 74 %
Platelets: 86 10*3/uL — ABNORMAL LOW (ref 150–400)
RBC: 2.39 MIL/uL — ABNORMAL LOW (ref 3.87–5.11)
RDW: 14 % (ref 11.5–15.5)
WBC: 4.3 10*3/uL (ref 4.0–10.5)
nRBC: 0 % (ref 0.0–0.2)

## 2020-09-04 LAB — LIPID PANEL
Cholesterol: 136 mg/dL (ref 0–200)
HDL: 51 mg/dL (ref >40)
LDL Cholesterol: 70 mg/dL (ref 0–99)
Total CHOL/HDL Ratio: 2.7 RATIO
Triglycerides: 74 mg/dL (ref <150)
VLDL: 15 mg/dL (ref 0–40)

## 2020-09-04 LAB — COMPREHENSIVE METABOLIC PANEL
ALT: 15 U/L (ref 0–44)
AST: 20 U/L (ref 15–41)
Albumin: 2.6 g/dL — ABNORMAL LOW (ref 3.5–5.0)
Alkaline Phosphatase: 48 U/L (ref 38–126)
Anion gap: 10 (ref 5–15)
BUN: 10 mg/dL (ref 8–23)
CO2: 24 mmol/L (ref 22–32)
Calcium: 8.9 mg/dL (ref 8.9–10.3)
Chloride: 104 mmol/L (ref 98–111)
Creatinine, Ser: 0.38 mg/dL — ABNORMAL LOW (ref 0.44–1.00)
GFR, Estimated: >60 mL/min (ref >60)
Glucose, Bld: 224 mg/dL — ABNORMAL HIGH (ref 70–99)
Potassium: 3.8 mmol/L (ref 3.5–5.1)
Sodium: 138 mmol/L (ref 135–145)
Total Bilirubin: 1 mg/dL (ref 0.3–1.2)
Total Protein: 6 g/dL — ABNORMAL LOW (ref 6.5–8.1)

## 2020-09-04 LAB — GLUCOSE, CAPILLARY
Glucose-Capillary: 209 mg/dL — ABNORMAL HIGH (ref 70–99)
Glucose-Capillary: 228 mg/dL — ABNORMAL HIGH (ref 70–99)
Glucose-Capillary: 214 mg/dL — ABNORMAL HIGH (ref 70–99)
Glucose-Capillary: 222 mg/dL — ABNORMAL HIGH (ref 70–99)
Glucose-Capillary: 281 mg/dL — ABNORMAL HIGH (ref 70–99)
Glucose-Capillary: 257 mg/dL — ABNORMAL HIGH (ref 70–99)

## 2020-09-04 LAB — MAGNESIUM: Magnesium: 1.8 mg/dL (ref 1.7–2.4)

## 2020-09-04 LAB — PHOSPHORUS: Phosphorus: 3.1 mg/dL (ref 2.5–4.6)

## 2020-09-04 LAB — URINE CULTURE: Culture: NO GROWTH

## 2020-09-04 LAB — TSH: TSH: 0.06 u[IU]/mL — ABNORMAL LOW (ref 0.350–4.500)

## 2020-09-04 LAB — PROCALCITONIN: Procalcitonin: 0.18 ng/mL

## 2020-09-04 LAB — LACTIC ACID, PLASMA: Lactic Acid, Venous: 1.4 mmol/L (ref 0.5–1.9)

## 2020-09-04 MED ORDER — IPRATROPIUM BROMIDE 0.02 % IN SOLN
0.5000 mg | RESPIRATORY_TRACT | Status: DC
  Administered 2020-09-04: 0.5 mg via RESPIRATORY_TRACT
  Filled 2020-09-04: qty 2.5

## 2020-09-04 MED ORDER — LACTATED RINGERS IV SOLN: INTRAVENOUS | Status: AC

## 2020-09-04 MED ORDER — FLORANEX PO PACK
1.0000 g | PACK | Freq: Three times a day (TID) | ORAL | Status: DC
  Filled 2020-09-04 (×8): qty 1

## 2020-09-04 MED ORDER — MAGNESIUM SULFATE 2 GM/50ML IV SOLN
2.0000 g | Freq: Once | INTRAVENOUS | Status: AC
  Administered 2020-09-04: 2 g via INTRAVENOUS
  Filled 2020-09-04: qty 50

## 2020-09-04 MED ORDER — INSULIN ASPART 100 UNIT/ML IJ SOLN
0.0000 [IU] | Freq: Every day | INTRAMUSCULAR | Status: DC
  Administered 2020-09-04: 3 [IU] via SUBCUTANEOUS
  Administered 2020-09-05: 4 [IU] via SUBCUTANEOUS

## 2020-09-04 MED ORDER — POTASSIUM CHLORIDE CRYS ER 20 MEQ PO TBCR: 40.0000 meq | EXTENDED_RELEASE_TABLET | Freq: Once | ORAL | Status: DC

## 2020-09-04 MED ORDER — LEVOTHYROXINE SODIUM 75 MCG PO TABS
75.0000 ug | ORAL_TABLET | Freq: Every day | ORAL | Status: DC
  Administered 2020-09-05 – 2020-09-06 (×2): 75 ug via ORAL
  Filled 2020-09-04 (×2): qty 3

## 2020-09-04 MED ORDER — INSULIN ASPART 100 UNIT/ML IJ SOLN
0.0000 [IU] | Freq: Three times a day (TID) | INTRAMUSCULAR | Status: DC
  Administered 2020-09-05: 11 [IU] via SUBCUTANEOUS
  Administered 2020-09-05: 5 [IU] via SUBCUTANEOUS
  Administered 2020-09-05 – 2020-09-06 (×2): 11 [IU] via SUBCUTANEOUS
  Administered 2020-09-06: 8 [IU] via SUBCUTANEOUS
  Administered 2020-09-06: 5 [IU] via SUBCUTANEOUS

## 2020-09-04 MED ORDER — METOPROLOL TARTRATE 50 MG PO TABS
50.0000 mg | ORAL_TABLET | Freq: Two times a day (BID) | ORAL | Status: DC
  Administered 2020-09-04 – 2020-09-05 (×3): 50 mg via ORAL
  Filled 2020-09-04 (×3): qty 1

## 2020-09-04 MED ORDER — RISAQUAD PO CAPS
1.0000 | ORAL_CAPSULE | Freq: Three times a day (TID) | ORAL | Status: DC
  Administered 2020-09-04 – 2020-09-06 (×7): 1 via ORAL
  Filled 2020-09-04 (×7): qty 1

## 2020-09-04 MED ORDER — IPRATROPIUM BROMIDE 0.02 % IN SOLN
0.5000 mg | Freq: Four times a day (QID) | RESPIRATORY_TRACT | Status: DC
  Administered 2020-09-04 – 2020-09-05 (×3): 0.5 mg via RESPIRATORY_TRACT
  Filled 2020-09-04 (×3): qty 2.5

## 2020-09-04 MED ORDER — DILTIAZEM HCL 60 MG PO TABS
60.0000 mg | ORAL_TABLET | Freq: Four times a day (QID) | ORAL | Status: DC
  Administered 2020-09-04 – 2020-09-05 (×4): 60 mg via ORAL
  Filled 2020-09-04 (×4): qty 1

## 2020-09-04 MED ORDER — LEVALBUTEROL HCL 1.25 MG/0.5ML IN NEBU
1.2500 mg | INHALATION_SOLUTION | Freq: Four times a day (QID) | RESPIRATORY_TRACT | Status: DC
  Administered 2020-09-04: 1.25 mg via RESPIRATORY_TRACT
  Filled 2020-09-04: qty 0.5

## 2020-09-04 MED ORDER — POTASSIUM CHLORIDE CRYS ER 20 MEQ PO TBCR
20.0000 meq | EXTENDED_RELEASE_TABLET | Freq: Once | ORAL | Status: AC
  Administered 2020-09-04: 20 meq via ORAL
  Filled 2020-09-04: qty 1

## 2020-09-04 MED ORDER — LEVALBUTEROL HCL 1.25 MG/0.5ML IN NEBU
1.2500 mg | INHALATION_SOLUTION | Freq: Four times a day (QID) | RESPIRATORY_TRACT | Status: DC
  Administered 2020-09-04 – 2020-09-05 (×3): 1.25 mg via RESPIRATORY_TRACT
  Filled 2020-09-04 (×3): qty 0.5

## 2020-09-04 NOTE — Progress Notes (Signed)
Modified Barium Swallow Progress Note  Patient Details  Name: [name] MRN: [id number] Date of Birth: 10-10-1950  Today's Date: 09/04/2020  Modified Barium Swallow completed.  Full report located under Chart Review in the Imaging Section.  Brief recommendations include the following:  Clinical Impression  Pt presents with mild oropharyngeal phase dysphagia with impaired lingual movement resulting in premature spillage of liquids (pt unable to hold thin liquids in oral cavity without spilling over base of tongue despite cues) and prolonged oral transit of solids, min delay in swallow initiation but more due to premature spillage with reduced laryngeal vestibule closure resulting in trace penetration and aspiration of thins (below the cords and then expelled with occasional verbal cue) and min vallecular residue. No appreciable difference with NTL. Esophageal sweep revealed delayed emptying and retrograde bolus movement in distal esophagus. Recommend regular textures and thin liquids when Pt is sitting upright, ok for small sips via straw and po medications whole with straw, pt to clear throat/cough intermittently and repeat swallow.   Swallow Evaluation Recommendations       SLP Diet Recommendations: Regular solids;Thin liquid   Liquid Administration via: Cup;Straw   Medication Administration: Whole meds with liquid   Supervision: Full assist for feeding;Staff to assist with self feeding   Compensations: Small sips/bites;Slow rate;Clear throat intermittently   Postural Changes: Remain semi-upright after after feeds/meals (Comment);Seated upright at 90 degrees   Oral Care Recommendations: Oral care BID;Staff/trained caregiver to provide oral care   Other Recommendations: Clarify dietary restrictions   Thank you,  Genene Churn, [place]  Marites Nath 09/04/2020,1:10 PM

## 2020-09-04 NOTE — Progress Notes (Signed)
PROGRESS NOTE    [name]  WNI:[id number] DOB: Jul 20, 1950 DOA: 09/02/2020 PCP: Abran Richard, MD     Brief Narrative:   70 y.o. WF PMHx Depression, COPD, asthma with chronic respiratory failure on 2 L O2 at home, OSA depression, HTN, thrombocytopenia, aspiration pneumonia, tremor with deep brain stimulator.  Patient presented to the ED for complaints of difficulty breathing, with cough productive of yellowish sputum.  Patient is able to answer very few questions, as at the time of my evaluation, patient starts coughing significantly limiting further history.  Patient tells me she has been having difficulty swallowing liquids, for which she had a barium swallow done.  She has not had any problems with solids.  This is a chronic issue for patient.  She denies vomiting. At the facility, patient's O2 sat had to be increased to 4 L  ED Course: Febrile - 101.2, tachycardic to 126, respiratory rate 18-28, blood pressure systolic 38-182.  O2 sats 93 - 97% on 4 L.  WBC 9.1.  Lactic acid 1.  UA ketones protein and rare bacteria.  Chest CT shows multilobar bilateral pneumonia most severe in the right lower lobe.  Small parapneumonic right-sided pleural effusion. IV vancomycin cefepime and metronidazole started.  1 L bolus given and Ringer's lactate at 150 cc/h started.  Hospitalist to admit.   Subjective:  The patient was seen and examined this morning, alert awake alert, following command, voice shaky with obvious head tremors. Hemodynamically stable exception of tachycardia heart rate 114, tachypneic respiratory rate of 23, satting 97% on  2 L of oxygen with cannula -down from 4 L  She was transferred to ICU due to hypoxia, tachypnea, tachycardia-severe sepsis physiology   Assessment & Plan: Covid vaccination; vaccinated 3/3   Principal Problem:   Pneumonia Active Problems:   Essential hypertension   Hypothyroidism   Asthma, moderate persistent, poorly-controlled   Obstructive  sleep apnea   Type II diabetes mellitus with manifestations ([name])   Thrombocytopenia ([place])  Multilobar bilateral pneumonia with sepsis-  -sepsis physiology continue to improve but remain tachypneic and tachycardic heart rate 114, respirate of 23 O2 demand has improved from 42 L, satting 97% -upon admission met criteria for sepsis: HR> 90, RR > 20, T> 38 C, site of infection lungs.  P - Chest CT without contrast shows multilobar bilateral pneumonia most severe in the right lower lobe, no parapneumonic right-sided pleural effusion.  COVID and influenza test negative. -Start IV Unasyn for aspiration pneumonia + IV azithromycin for atypical coverage - 0.9% saline with KCl 40 mEq 181m/hr >>> will change to fluids to LR, -Lactic acid 1.4, procalcitonin 0.18, WBC 4.3, -Influenza A/B negative, SARS-CoV-2 negative -DuoNeb - Flutter valve - Incentive spirometry - Solu-Medrol 60 mg BID >> continue - 5/31 sputum pending - Swallow study pending patient most likely aspirating  Hypokalemia -Potassium goal 4 -Potassium IV 60 mEq 09/03/2020 - Recheck K/Mg - repleted accordingly  Thrombocytopenia (chronic) -unknown physiology - Baseline platelets 82-128 -Currently 71, 86 - Currently no obvious sites of bleeding but will need to monitor closely -Antiplatelet therapy  Esophageal wall thickening -Per CT chest, circumferential thickening of the distal third of the esophagus differentials reflux esophagitis versus esophageal metaplasia or neoplasia, further evaluation with nonemergent endoscopy should be considered.  Last EGD by Dr. RGala Romney2020  - mild [name] ring and erosive gastropathy. -Follow-up as outpatient with her gastroenterologist  Asthma, obstructive sleep apnea, chronic respiratory failure- -On 2 L O2 per [facility] at baseline, currently on 4  L with saturations 93 to 97%.>>>  Back to 2 L this a.m. satting 97% -DuoNebs as needed and scheduled, resume home bronchodilators -Titrate O2 to  maintain SPO2> 92%  Hypertension -Blood pressure has been running soft but improved this a.m. -Hold metoprolol for now  sinus tachycardia - EKGs >>> sinus tachycardia -A. fib was ruled out - Cardizem drip >>> we will discontinue  DM type II uncontrolled with complication -09/06/1306 hemoglobin A1c SSI= 7.4- - 5/31 hemoglobin A1c pending - 5/31 lipid panel pending - Moderate SSI  Seizures-  - Depacon 500 mg TID  Bipolar disorder -Hold home sertraline and Seroquel for now while n.p.o. pending swallow evaluation  Hypothyroidism -Hold Synthroid until patient passes swallow evaluation  Tremor-  -Deep brain stimulator.  Sees [name]. -Resume primidone when able      DVT prophylaxis: SCD Code Status: Full Family Communication:  Status is: Inpatient    Dispo: The patient is from: Home              Anticipated d/c is to: Home              Anticipated d/c date is: 6/3              Patient currently unstable      Consultants:    Procedures/Significant Events:  1/30 okay CTabdomen and chest  WO contrast Multilobar bilateral pneumonia, most severe in the right lower lobe. Small parapneumonic right-sided pleural effusion. 2. Circumferential thickening of the distal third of the esophagus. This may simply reflect reflux esophagitis, however, if there is any clinical concern for esophageal metaplasia or neoplasia, further evaluation with nonemergent endoscopy should be considered. 3. No acute findings are noted in the abdomen or pelvis to account for the patient's symptoms. 4. Colonic diverticulosis without evidence of acute diverticulitis at this time. 5. Aortic atherosclerosis, in addition to left anterior descending and left circumflex coronary artery disease. Please note that although the presence of coronary artery calcium documents the presence of coronary artery disease, the severity of this disease and any potential stenosis cannot be assessed on this  non-gated CT examination. Assessment for potential risk factor modification, dietary therapy or pharmacologic therapy may be warranted, if clinically indicated.   I have personally reviewed and interpreted all radiology studies and my findings are as above.  VENTILATOR SETTINGS: Nasal cannula 5/31 Flow 4 L/min SPO2 97%   Cultures   Antimicrobials: Anti-infectives (From admission, onward)   Start     Ordered Stop   09/02/20 2200  ceFEPIme (MAXIPIME) 2 g in sodium chloride 0.9 % 100 mL IVPB  Status:  Discontinued        09/02/20 1304 09/02/20 1659   09/02/20 2100  vancomycin (VANCOCIN) IVPB 1000 mg/200 mL premix  Status:  Discontinued        09/02/20 1304 09/02/20 1659   09/02/20 1900  azithromycin (ZITHROMAX) 500 mg in sodium chloride 0.9 % 250 mL IVPB        09/02/20 1844     09/02/20 1730  Ampicillin-Sulbactam (UNASYN) 3 g in sodium chloride 0.9 % 100 mL IVPB        09/02/20 1710     09/02/20 1315  ceFEPIme (MAXIPIME) 2 g in sodium chloride 0.9 % 100 mL IVPB        09/02/20 1302 09/02/20 1432   09/02/20 1315  metroNIDAZOLE (FLAGYL) IVPB 500 mg        09/02/20 1302 09/02/20 1534   09/02/20 1315  vancomycin (VANCOCIN) IVPB  1000 mg/200 mL premix        09/02/20 1302 09/02/20 1432       Devices    LINES / TUBES:      Continuous Infusions: . ampicillin-sulbactam (UNASYN) IV 3 g (09/04/20 0606)  . azithromycin Stopped (09/03/20 2014)  . diltiazem (CARDIZEM) infusion Stopped (09/04/20 0156)  . valproate sodium 500 mg (09/04/20 0658)     Objective: Vitals:   09/04/20 0700 09/04/20 0800 09/04/20 0822 09/04/20 0900  BP: (!) 119/45 (!) 144/53  120/86  Pulse: 79 86  (!) 114  Resp: 19 17  (!) 23  Temp:   98.1 F (36.7 C)   TempSrc:   Axillary   SpO2: 100% 100%  97%  Weight:      Height:        Intake/Output Summary (Last 24 hours) at 09/04/2020 1131 Last data filed at 09/04/2020 0502 Gross per 24 hour  Intake 1463.91 ml  Output 850 ml  Net 613.91 ml   Filed  Weights   09/02/20 1107 09/03/20 1444  Weight: 80.6 kg 82.2 kg       Physical Exam:   General:  Alert, oriented, cooperative, head tremors noted,  HEENT:  Normocephalic, PERRL, otherwise with in Normal limits   Neuro:  CNII-XII intact. , normal motor and sensation, reflexes intact   Lungs:   Clear to auscultation BL, Respirations unlabored, no wheezes / crackles  Cardio:    S1/S2, RRR, No murmure, No Rubs or Gallops   Abdomen:   Soft, non-tender, bowel sounds active all four quadrants,  no guarding or peritoneal signs.  Muscular skeletal:  Limited exam - in bed, able to move all 4 extremities, Normal strength,  2+ pulses,  symmetric, No pitting edema  Skin:  Dry, warm to touch, negative for any Rashes,  Wounds: Please see nursing documentation          Data Reviewed: Care during the described time interval was provided by me .  I have reviewed this patient's available data, including medical history, events of note, physical examination, and all test results as part of my evaluation.  CBC: Recent Labs  Lab 09/02/20 1133 09/03/20 0611 09/04/20 0453  WBC 9.1 6.2 4.3  NEUTROABS 7.3  --  3.5  HGB 11.1* 9.6* 8.7*  HCT 32.7* 29.8* 26.8*  MCV 110.1* 114.2* 112.1*  PLT 83* 71* 86*   Basic Metabolic Panel: Recent Labs  Lab 09/02/20 1133 09/02/20 1653 09/03/20 0611 09/03/20 1550 09/04/20 0453  NA 134*  --  140  --  138  K 3.2*  --  3.4* 3.7 3.8  CL 99  --  106  --  104  CO2 23  --  27  --  24  GLUCOSE 211*  --  160*  --  224*  BUN 10  --  7*  --  10  CREATININE 0.43*  --  0.44  --  0.38*  CALCIUM 8.7*  --  8.1*  --  8.9  MG  --  1.6*  --  1.8 1.8  PHOS  --   --   --   --  3.1   GFR: Estimated Creatinine Clearance: 66.9 mL/min (A) (by C-G formula based on SCr of 0.38 mg/dL (L)). Liver Function Tests: Recent Labs  Lab 09/02/20 1133 09/04/20 0453  AST 12* 20  ALT 9 15  ALKPHOS 45 48  BILITOT 1.6* 1.0  PROT 6.7 6.0*  ALBUMIN 3.1* 2.6*   Recent Labs  Lab  09/02/20  1133  LIPASE 22   No results for input(s): AMMONIA in the last 168 hours. Coagulation Profile: No results for input(s): INR, PROTIME in the last 168 hours. Cardiac Enzymes: No results for input(s): CKTOTAL, CKMB, CKMBINDEX, TROPONINI in the last 168 hours. BNP (last 3 results) No results for input(s): PROBNP in the last 8760 hours. HbA1C: Recent Labs    09/02/20 1711  HGBA1C 5.6   CBG: Recent Labs  Lab 09/03/20 2016 09/03/20 2336 09/04/20 0458 09/04/20 0752 09/04/20 1122  GLUCAP 233* 227* 209* 228* 214*   Lipid Profile: Recent Labs    09/04/20 0453  CHOL 136  HDL 51  LDLCALC 70  TRIG 74  CHOLHDL 2.7   Thyroid Function Tests: No results for input(s): TSH, T4TOTAL, FREET4, T3FREE, THYROIDAB in the last 72 hours. Anemia Panel: No results for input(s): VITAMINB12, FOLATE, FERRITIN, TIBC, IRON, RETICCTPCT in the last 72 hours. Sepsis Labs: Recent Labs  Lab 09/02/20 1135 09/04/20 0453 09/04/20 0929  PROCALCITON  --  0.18  --   LATICACIDVEN 1.0  --  1.4    Recent Results (from the past 240 hour(s))  Resp Panel by RT-PCR (Flu A&B, Covid) Nasopharyngeal Swab     Status: None   Collection Time: 09/02/20 10:59 AM   Specimen: Nasopharyngeal Swab; Nasopharyngeal(NP) swabs in vial transport medium  Result Value Ref Range Status   SARS Coronavirus 2 by RT PCR NEGATIVE NEGATIVE Final    Comment: (NOTE) SARS-CoV-2 target nucleic acids are NOT DETECTED.  The SARS-CoV-2 RNA is generally detectable in upper respiratory specimens during the acute phase of infection. The lowest concentration of SARS-CoV-2 viral copies this assay can detect is 138 copies/mL. A negative result does not preclude SARS-Cov-2 infection and should not be used as the sole basis for treatment or other patient management decisions. A negative result may occur with  improper specimen collection/handling, submission of specimen other than nasopharyngeal swab, presence of viral mutation(s)  within the areas targeted by this assay, and inadequate number of viral copies(<138 copies/mL). A negative result must be combined with clinical observations, patient history, and epidemiological information. The expected result is Negative.  Fact Sheet for Patients:  EntrepreneurPulse.com.au  Fact Sheet for Healthcare Providers:  IncredibleEmployment.be  This test is no t yet approved or cleared by the [place] FDA and  has been authorized for detection and/or diagnosis of SARS-CoV-2 by FDA under an Emergency Use Authorization (EUA). This EUA will remain  in effect (meaning this test can be used) for the duration of the COVID-19 declaration under Section 564(b)(1) of the Act, 21 U.S.C.section 360bbb-3(b)(1), unless the authorization is terminated  or revoked sooner.       Influenza A by PCR NEGATIVE NEGATIVE Final   Influenza B by PCR NEGATIVE NEGATIVE Final    Comment: (NOTE) The Xpert Xpress SARS-CoV-2/FLU/RSV plus assay is intended as an aid in the diagnosis of influenza from Nasopharyngeal swab specimens and should not be used as a sole basis for treatment. Nasal washings and aspirates are unacceptable for Xpert Xpress SARS-CoV-2/FLU/RSV testing.  Fact Sheet for Patients: EntrepreneurPulse.com.au  Fact Sheet for Healthcare Providers: IncredibleEmployment.be  This test is not yet approved or cleared by the [place] FDA and has been authorized for detection and/or diagnosis of SARS-CoV-2 by FDA under an Emergency Use Authorization (EUA). This EUA will remain in effect (meaning this test can be used) for the duration of the COVID-19 declaration under Section 564(b)(1) of the Act, 21 U.S.C. section 360bbb-3(b)(1), unless the authorization is  terminated or revoked.  Performed at [facility], [address] [place], [place] [address]   Blood Culture (routine x 2)     Status: None  (Preliminary result)   Collection Time: 09/02/20  1:16 PM   Specimen: BLOOD RIGHT WRIST  Result Value Ref Range Status   Specimen Description   Final    BLOOD RIGHT WRIST BOTTLES DRAWN AEROBIC AND ANAEROBIC   Special Requests Blood Culture adequate volume  Final   Culture   Final    NO GROWTH 2 DAYS Performed at [facility], [address], [place], [place] [address]    Report Status PENDING  Incomplete  Blood Culture (routine x 2)     Status: None (Preliminary result)   Collection Time: 09/02/20  1:16 PM   Specimen: Right Antecubital; Blood  Result Value Ref Range Status   Specimen Description   Final    RIGHT ANTECUBITAL BOTTLES DRAWN AEROBIC AND ANAEROBIC   Special Requests Blood Culture adequate volume  Final   Culture   Final    NO GROWTH 2 DAYS Performed at [facility], [address] [place], [place] [address]    Report Status PENDING  Incomplete  Urine culture     Status: None   Collection Time: 09/02/20  2:10 PM   Specimen: Urine, Catheterized  Result Value Ref Range Status   Specimen Description   Final    URINE, CATHETERIZED Performed at [facility], [address] [place], [place] [address]    Special Requests   Final    NONE Performed at [facility], [address], [place], [place] [address]    Culture   Final    NO GROWTH Performed at [facility], [place] [address], [place], [place] [address]    Report Status 09/04/2020 FINAL  Final  MRSA PCR Screening     Status: Abnormal   Collection Time: 09/03/20  3:34 AM   Specimen: Nasopharyngeal  Result Value Ref Range Status   MRSA by PCR POSITIVE (A) NEGATIVE Final    Comment:        The GeneXpert MRSA Assay (FDA approved for NASAL specimens only), is one component of a comprehensive MRSA colonization surveillance program. It is not intended to diagnose MRSA infection nor to guide or monitor treatment for MRSA infections. RESULT CALLED TO, READ BACK BY AND VERIFIED WITH: A ROGERS,RN'@[id number]'  09/03/20  MKELLY Performed at [facility], [address] [place], [place] [address]   Respiratory (~20 pathogens) panel by PCR     Status: None   Collection Time: 09/03/20  1:15 PM   Specimen: Nasopharyngeal Swab; Respiratory  Result Value Ref Range Status   Adenovirus NOT DETECTED NOT DETECTED Final   Coronavirus 229E NOT DETECTED NOT DETECTED Final    Comment: (NOTE) The Coronavirus on the Respiratory Panel, DOES NOT test for the novel  Coronavirus (2019 nCoV)    Coronavirus HKU1 NOT DETECTED NOT DETECTED Final   Coronavirus NL63 NOT DETECTED NOT DETECTED Final   Coronavirus OC43 NOT DETECTED NOT DETECTED Final   Metapneumovirus NOT DETECTED NOT DETECTED Final   Rhinovirus / Enterovirus NOT DETECTED NOT DETECTED Final   Influenza A NOT DETECTED NOT DETECTED Final   Influenza B NOT DETECTED NOT DETECTED Final   Parainfluenza Virus 1 NOT DETECTED NOT DETECTED Final   Parainfluenza Virus 2 NOT DETECTED NOT DETECTED Final   Parainfluenza Virus 3 NOT DETECTED NOT DETECTED Final   Parainfluenza Virus 4 NOT DETECTED NOT DETECTED Final  Respiratory Syncytial Virus NOT DETECTED NOT DETECTED Final   Bordetella pertussis NOT DETECTED NOT DETECTED Final   Bordetella Parapertussis NOT DETECTED NOT DETECTED Final   Chlamydophila pneumoniae NOT DETECTED NOT DETECTED Final   Mycoplasma pneumoniae NOT DETECTED NOT DETECTED Final    Comment: Performed at [facility], [place] [address] [place], [place] [address]  Expectorated Sputum Assessment w Gram Stain, Rflx to Resp Cult     Status: None   Collection Time: 09/03/20  1:22 PM   Specimen: Sputum  Result Value Ref Range Status   Specimen Description SPUTUM  Final   Special Requests NONE  Final   Sputum evaluation   Final    Sputum specimen not acceptable for testing.  Please recollect.   Performed at [facility], [address], [place], [place] [address]    Report Status 09/03/2020 FINAL  Final         Radiology Studies: CT ABDOMEN  PELVIS WO CONTRAST  Result Date: 09/02/2020 CLINICAL DATA:  70 year old female with history of acute nonlocalized abdominal pain. Shortness of breath and productive cough with yellow sputum. EXAM: CT CHEST, ABDOMEN AND PELVIS WITHOUT CONTRAST TECHNIQUE: Multidetector CT imaging of the chest, abdomen and pelvis was performed following the standard protocol without IV contrast. COMPARISON:  CT the abdomen and pelvis 10/01/2017. FINDINGS: CT CHEST FINDINGS Cardiovascular: Heart size is normal. There is no significant pericardial fluid, thickening or pericardial calcification. There is aortic atherosclerosis, as well as atherosclerosis of the great vessels of the mediastinum and the coronary arteries, including calcified atherosclerotic plaque in the left anterior descending and left circumflex coronary arteries. Mediastinum/Nodes: No pathologically enlarged mediastinal or hilar lymph nodes. Please note that accurate exclusion of hilar adenopathy is limited on noncontrast CT scans. Thickening of the distal esophagus. No axillary lymphadenopathy. Lungs/Pleura: Extensive airspace consolidation noted in the lungs, most confluent in the right lower lobe but also involving the posterior aspect of the right upper lobe and left lower lobe. Small right pleural effusion. No definite suspicious appearing pulmonary nodules or masses are noted. Musculoskeletal: There are no aggressive appearing lytic or blastic lesions noted in the visualized portions of the skeleton. CT ABDOMEN PELVIS FINDINGS Hepatobiliary: No definite suspicious cystic or solid hepatic lesions are confidently identified on today's noncontrast CT examination. Status post cholecystectomy. Pancreas: No definite pancreatic mass or peripancreatic fluid collections or inflammatory changes are noted on today's noncontrast CT examination. Spleen: Unremarkable. Adrenals/Urinary Tract: Unenhanced appearance of the kidneys and bilateral adrenal glands is normal. No  hydroureteronephrosis. Small amount of gas non dependently within the lumen of the urinary bladder. Urinary bladder is otherwise normal in appearance. Stomach/Bowel: Unenhanced appearance of the stomach is normal. There is no pathologic dilatation of small bowel or colon. Numerous colonic diverticulae are noted, without surrounding inflammatory changes to suggest an acute diverticulitis at this time. The appendix is not confidently identified and may be surgically absent. Regardless, there are no inflammatory changes noted adjacent to the cecum to suggest the presence of an acute appendicitis at this time. Vascular/Lymphatic: Aortic atherosclerosis. No lymphadenopathy noted in the abdomen or pelvis. Reproductive: Status post hysterectomy. Right ovary is atrophic. Low-attenuation lesions in the left ovary, decreased in size compared to the prior study, largest of which measures 4.2 x 2.7 cm (axial image 96 of series 2), incompletely characterize, but presumably benign given the slight regression. Other: No significant volume of ascites.  No pneumoperitoneum. Musculoskeletal: Status post PLIF at L4-L5. There are no aggressive appearing lytic or blastic  lesions noted in the visualized portions of the skeleton. IMPRESSION: 1. Multilobar bilateral pneumonia, most severe in the right lower lobe. Small parapneumonic right-sided pleural effusion. 2. Circumferential thickening of the distal third of the esophagus. This may simply reflect reflux esophagitis, however, if there is any clinical concern for esophageal metaplasia or neoplasia, further evaluation with nonemergent endoscopy should be considered. 3. No acute findings are noted in the abdomen or pelvis to account for the patient's symptoms. 4. Colonic diverticulosis without evidence of acute diverticulitis at this time. 5. Aortic atherosclerosis, in addition to left anterior descending and left circumflex coronary artery disease. Please note that although the presence  of coronary artery calcium documents the presence of coronary artery disease, the severity of this disease and any potential stenosis cannot be assessed on this non-gated CT examination. Assessment for potential risk factor modification, dietary therapy or pharmacologic therapy may be warranted, if clinically indicated. Electronically Signed   By: Vinnie Langton M.D.   On: 09/02/2020 14:49   DG Chest 1 View  Result Date: 09/02/2020 CLINICAL DATA:  Short of breath.  Productive cough. EXAM: CHEST  1 VIEW COMPARISON:  11/19/2019. FINDINGS: Cardiac silhouette is normal in size. No mediastinal or hilar masses. Mild linear opacity extends inferolaterally from the right hilum consistent with atelectasis. Mild prominence of the interstitial markings bilaterally, stable. Lungs otherwise clear. No convincing pleural effusion.  No pneumothorax. Skeletal structures are grossly intact. IMPRESSION: No acute cardiopulmonary disease. Electronically Signed   By: Lajean Manes M.D.   On: 09/02/2020 12:01   CT Chest Wo Contrast  Result Date: 09/02/2020 CLINICAL DATA:  70 year old female with history of acute nonlocalized abdominal pain. Shortness of breath and productive cough with yellow sputum. EXAM: CT CHEST, ABDOMEN AND PELVIS WITHOUT CONTRAST TECHNIQUE: Multidetector CT imaging of the chest, abdomen and pelvis was performed following the standard protocol without IV contrast. COMPARISON:  CT the abdomen and pelvis 10/01/2017. FINDINGS: CT CHEST FINDINGS Cardiovascular: Heart size is normal. There is no significant pericardial fluid, thickening or pericardial calcification. There is aortic atherosclerosis, as well as atherosclerosis of the great vessels of the mediastinum and the coronary arteries, including calcified atherosclerotic plaque in the left anterior descending and left circumflex coronary arteries. Mediastinum/Nodes: No pathologically enlarged mediastinal or hilar lymph nodes. Please note that accurate  exclusion of hilar adenopathy is limited on noncontrast CT scans. Thickening of the distal esophagus. No axillary lymphadenopathy. Lungs/Pleura: Extensive airspace consolidation noted in the lungs, most confluent in the right lower lobe but also involving the posterior aspect of the right upper lobe and left lower lobe. Small right pleural effusion. No definite suspicious appearing pulmonary nodules or masses are noted. Musculoskeletal: There are no aggressive appearing lytic or blastic lesions noted in the visualized portions of the skeleton. CT ABDOMEN PELVIS FINDINGS Hepatobiliary: No definite suspicious cystic or solid hepatic lesions are confidently identified on today's noncontrast CT examination. Status post cholecystectomy. Pancreas: No definite pancreatic mass or peripancreatic fluid collections or inflammatory changes are noted on today's noncontrast CT examination. Spleen: Unremarkable. Adrenals/Urinary Tract: Unenhanced appearance of the kidneys and bilateral adrenal glands is normal. No hydroureteronephrosis. Small amount of gas non dependently within the lumen of the urinary bladder. Urinary bladder is otherwise normal in appearance. Stomach/Bowel: Unenhanced appearance of the stomach is normal. There is no pathologic dilatation of small bowel or colon. Numerous colonic diverticulae are noted, without surrounding inflammatory changes to suggest an acute diverticulitis at this time. The appendix is not confidently identified and may be  surgically absent. Regardless, there are no inflammatory changes noted adjacent to the cecum to suggest the presence of an acute appendicitis at this time. Vascular/Lymphatic: Aortic atherosclerosis. No lymphadenopathy noted in the abdomen or pelvis. Reproductive: Status post hysterectomy. Right ovary is atrophic. Low-attenuation lesions in the left ovary, decreased in size compared to the prior study, largest of which measures 4.2 x 2.7 cm (axial image 96 of series 2),  incompletely characterize, but presumably benign given the slight regression. Other: No significant volume of ascites.  No pneumoperitoneum. Musculoskeletal: Status post PLIF at L4-L5. There are no aggressive appearing lytic or blastic lesions noted in the visualized portions of the skeleton. IMPRESSION: 1. Multilobar bilateral pneumonia, most severe in the right lower lobe. Small parapneumonic right-sided pleural effusion. 2. Circumferential thickening of the distal third of the esophagus. This may simply reflect reflux esophagitis, however, if there is any clinical concern for esophageal metaplasia or neoplasia, further evaluation with nonemergent endoscopy should be considered. 3. No acute findings are noted in the abdomen or pelvis to account for the patient's symptoms. 4. Colonic diverticulosis without evidence of acute diverticulitis at this time. 5. Aortic atherosclerosis, in addition to left anterior descending and left circumflex coronary artery disease. Please note that although the presence of coronary artery calcium documents the presence of coronary artery disease, the severity of this disease and any potential stenosis cannot be assessed on this non-gated CT examination. Assessment for potential risk factor modification, dietary therapy or pharmacologic therapy may be warranted, if clinically indicated. Electronically Signed   By: Vinnie Langton M.D.   On: 09/02/2020 14:49   DG CHEST PORT 1 VIEW  Result Date: 09/04/2020 CLINICAL DATA:  Shortness of breath, COPD EXAM: PORTABLE CHEST 1 VIEW COMPARISON:  09/02/2020 FINDINGS: No focal consolidation. No pleural effusion or pneumothorax. Heart and mediastinal contours are unremarkable. Deep brain stimulator power packs projecting over the right and left anterior chest walls. No acute osseous abnormality. IMPRESSION: No active disease. Electronically Signed   By: Kathreen Devoid   On: 09/04/2020 09:49        Scheduled Meds: . acidophilus  1 capsule  Oral TID WC  . Chlorhexidine Gluconate Cloth  6 each Topical Q0600  . fluticasone furoate-vilanterol  1 puff Inhalation Daily  . insulin aspart  0-15 Units Subcutaneous Q4H  . ipratropium-albuterol  3 mL Nebulization QID  . methylPREDNISolone (SOLU-MEDROL) injection  60 mg Intravenous Q12H  . mupirocin ointment  1 application Nasal BID   Continuous Infusions: . ampicillin-sulbactam (UNASYN) IV 3 g (09/04/20 0606)  . azithromycin Stopped (09/03/20 2014)  . diltiazem (CARDIZEM) infusion Stopped (09/04/20 0156)  . valproate sodium 500 mg (09/04/20 0658)     LOS: 2 days    Time spent:40 min    Deatra James, MD [facility]   If 7PM-7AM, please contact night-coverage 09/04/2020, 11:31 AM

## 2020-09-04 NOTE — Evaluation (Signed)
Clinical/Bedside Swallow Evaluation Patient Details  Name: [name] MRN: [id number] Date of Birth: Apr 09, 1950  Today's Date: 09/04/2020 Time: SLP Start Time (ACUTE ONLY): 0830 SLP Stop Time (ACUTE ONLY): 0854 SLP Time Calculation (min) (ACUTE ONLY): 24 min  Past Medical History:  Past Medical History:  Diagnosis Date  . Allergic rhinitis    uses Nasonex daily as needed  . Allergy to angiotensin receptor blockers (ARB) 01/22/2014   Angioedema  . Anxiety    takes Ativan daily as needed  . Aspiration pneumonia ([place])   . Asthma    Albuterol inhaler and Neb daily as needed  . 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 [name], residual palpitations  . Bipolar 1 disorder ([place])   . Chronic back pain    reason unknown  . Constipation   . COPD (chronic obstructive pulmonary disease) ([place])   . DDD (degenerative disc disease), lumbar   . Depression    takes Zoloft daily as well as Cymbalta  . Diabetes mellitus    Type II  . Dysphagia   . Dysrhythmia    SVT ==ablation done by Dr. Rayann Heman 2007  . Esophageal reflux    takes Zantac daily  . Frequent headaches   . History of bronchitis Dec 2014  . Hypertension    takes Losartan daily  . Hypothyroidism    takes Synthroid daily  . Joint swelling    left ankle  . Low sodium syndrome   . Lumbar radiculopathy   . Seizures ([place]) 04-05-13   one time ran out of Primidone and had stopped it cold [place]  . SVT (supraventricular tachycardia) ([place])   . Tremor, essential    takes Mysoline and Neurontin daily.  . Vocal cord dysfunction   . Weakness    in left arm   Past Surgical History:  Past Surgical History:  Procedure Laterality Date  . ABDOMINAL HYSTERECTOMY    . ABLATION FOR AVNRT    . APPENDECTOMY    . BARTHOLIN GLAND CYST EXCISION     x 2  . BIOPSY  03/04/2015   Procedure: BIOPSY (Duodenal, Gastric);  Surgeon: Daneil Dolin, MD;  Location: [facility];  Service: Endoscopy;;  .  BIOPSY  12/19/2018   Procedure: BIOPSY;  Surgeon: Daneil Dolin, MD;  [facility]: [facility];  Service: Endoscopy;;  Gastric Biopsies    . BREAST SURGERY    . CESAREAN SECTION    . CHOLECYSTECTOMY    . COLONOSCOPY  01/16/2004   UXL:[id number] rectum/colon  . COLONOSCOPY WITH PROPOFOL N/A 03/04/2015   Dr.[name]- internal hemorrhoids, pancolonic diverticulosis, colonic polyp= tubular adenoma  . DEEP BRAIN STIMULATOR PLACEMENT    . DENTAL SURGERY    . ESOPHAGEAL DILATION N/A 03/04/2015   Procedure: ESOPHAGEAL DILATION WITH 54FR MALONEY DILATOR;  Surgeon: Daneil Dolin, MD;  Location: [facility];  Service: Endoscopy;  Laterality: N/A;  . ESOPHAGEAL DILATION  12/19/2018   Procedure: ESOPHAGEAL DILATION;  Surgeon: Daneil Dolin, MD;  [facility];  Service: Endoscopy;;  . ESOPHAGOGASTRODUODENOSCOPY (EGD) WITH ESOPHAGEAL DILATION  04/03/2002   UVO:[id number] ring, otherwise normal esophagus, status post dilation with 56 F/Normal stomach  . ESOPHAGOGASTRODUODENOSCOPY (EGD) WITH ESOPHAGEAL DILATION N/A 11/08/2012   Dr. [name]:schatzki's ring s/p dilation/hiatal hernia  . ESOPHAGOGASTRODUODENOSCOPY (EGD) WITH PROPOFOL N/A 03/04/2015   Dr.[name]- schatzki's ring, hiatal hernia, scattered erosions bx= chronic inactive gastritis  . ESOPHAGOGASTRODUODENOSCOPY (EGD) WITH PROPOFOL N/A 05/06/2017   Normal esophagus.  Dilated. Small hiatal hernia. Normal duodenal bulb and second portion of duodenum.  . ESOPHAGOGASTRODUODENOSCOPY (EGD) WITH PROPOFOL N/A 12/19/2018   [name]: Mild [name] ring status post dilation, multiple erosions in the stomach, biopsies negative.  Marland Kitchen FINGER SURGERY    . HEMORRHOID BANDING  2017   Dr.[name]  . LEFT ANKLE LIGAMENT REPAIR     x 2  . LEFT ELBOW REPAIR    . MALONEY DILATION N/A 05/06/2017   Procedure: Venia Minks DILATION;  Surgeon: Daneil Dolin, MD;  [facility];  Service: Endoscopy;  Laterality: N/A;  . Venia Minks DILATION N/A 12/19/2018   Procedure: Venia Minks  DILATION;  Surgeon: Daneil Dolin, MD;  [facility];  Service: Endoscopy;  Laterality: N/A;  . MAXIMUM ACCESS (MAS)POSTERIOR LUMBAR INTERBODY FUSION (PLIF) 1 LEVEL N/A 04/24/2014   Procedure: Lumbar four-five Maximum access Surgery posterior lumbar interbody fusion;  Surgeon: [name], MD;  Location: [place] [facility];  Service: Neurosurgery;  Laterality: N/A;  Lumbar four-five Maximum access Surgery posterior lumbar interbody fusion  . MULTIPLE EXTRACTIONS WITH ALVEOLOPLASTY N/A 06/18/2017   Procedure: MULTIPLE EXTRACTION;  Surgeon: Diona Browner, DDS;  Location: [place];  Service: Oral Surgery;  Laterality: N/A;  . PARTIAL HYSTERECTOMY    . POLYPECTOMY  03/04/2015   Procedure: POLYPECTOMY (descending colon);  Surgeon: Daneil Dolin, MD;  Location: [facility];  Service: Endoscopy;;  . PULSE GENERATOR IMPLANT Bilateral 12/15/2013   Procedure: BILATERAL PULSE GENERATOR IMPLANT;  Surgeon: [name], MD;  Location: [place] [facility];  Service: Neurosurgery;  Laterality: Bilateral;  BILATERAL  . RIGHT BREAST CYST     benign  . SUBTHALAMIC STIMULATOR BATTERY REPLACEMENT Bilateral 06/24/2018   Procedure: Bilateral implantable pulse generator battery change;  Surgeon: [name], MD;  Location: [place];  Service: Neurosurgery;  Laterality: Bilateral;  bilateral  . SUBTHALAMIC STIMULATOR INSERTION Bilateral 12/04/2013   Procedure: Bilateral Deep brain stimulator placement;  Surgeon: [name], MD;  Location: [place] [facility];  Service: Neurosurgery;  Laterality: Bilateral;  Bilateral Deep brain stimulator placement  . TONSILLECTOMY     HPI:  70 y.o. WF PMHx Depression, COPD, asthma with chronic respiratory failure on 2 L O2 at home, OSA depression, HTN, thrombocytopenia, aspiration pneumonia, tremor with deep brain stimulator. Patient presented to the ED for complaints of difficulty breathing, with cough productive of yellowish sputum. Pt had EGD September 2020, mild [name] ring status post dilation.   Multiple erosions in the stomach, biopsies negative. MBSS December 2020 with mild pharyngeal aspiration with wet quality to improve after clearing of the throat recommended regular textures and thin liquids but no straws and standard aspiration reflux precautions. Per CT chest, circumferential thickening of the distal third of the esophagus differentials reflux esophagitis versus esophageal metaplasia or neoplasia, further evaluation with nonemergent endoscopy should be considered. BSE requested.   Assessment / Plan / Recommendation Clinical Impression  Clinical swallow evaluation completed at bedside. Pt known to SLP from previous MBSS completed in December 2020 with recommendation for regular textures and thin liquids, no straws with trace aspiration of thins with straw. Pt has a tremor (has DBS) and bulbar quality to her speech with some hyponasality. Unsure if this could be related to her tremor and/or previous/current use of psychiatric medications (? Dyskinesia). She indicates that she uses a cup with a large spout/straw at the [facility], likely due to tremor, however this could be increasing her risk for aspiration. Given h/o dysphagia and current PNA, will proceed with MBSS later today.  Above to RN.  SLP Visit Diagnosis: Dysphagia, unspecified (R13.10)    Aspiration Risk  Mild aspiration risk    Diet Recommendation     Medication Administration: Whole meds with liquid Supervision: Patient able to self feed    Other  Recommendations     Follow up Recommendations Skilled Nursing facility      Frequency and Duration min 2x/week  1 week       Prognosis Prognosis for Safe Diet Advancement: Fair Barriers to Reach Goals: Severity of deficits      Swallow Study   General Date of Onset: 09/03/20 HPI: 70 y.o. WF PMHx Depression, COPD, asthma with chronic respiratory failure on 2 L O2 at home, OSA depression, HTN, thrombocytopenia, aspiration pneumonia, tremor with deep brain  stimulator. Patient presented to the ED for complaints of difficulty breathing, with cough productive of yellowish sputum. Pt had EGD September 2020, mild [name] ring status post dilation.  Multiple erosions in the stomach, biopsies negative. MBSS December 2020 with mild pharyngeal aspiration with wet quality to improve after clearing of the throat recommended regular textures and thin liquids but no straws and standard aspiration reflux precautions. Per CT chest, circumferential thickening of the distal third of the esophagus differentials reflux esophagitis versus esophageal metaplasia or neoplasia, further evaluation with nonemergent endoscopy should be considered. BSE requested. Type of Study: Bedside Swallow Evaluation Previous Swallow Assessment: MBSS 12/20 reg/thin, no straws Diet Prior to this Study: NPO Temperature Spikes Noted: No Respiratory Status: Nasal cannula History of Recent Intubation: No Behavior/Cognition: Alert;Cooperative;Pleasant mood Oral Cavity Assessment: Within Functional Limits Oral Care Completed by SLP: Recent completion by staff Oral Cavity - Dentition: Adequate natural dentition;Missing dentition Vision: Functional for self-feeding (difficulty with self feeding due to tremor) Self-Feeding Abilities: Needs assist Patient Positioning: Upright in bed Baseline Vocal Quality: Normal (mild harshness/bulbar quality?) Volitional Cough: Strong;Congested Volitional Swallow: Able to elicit    Oral/Motor/Sensory Function Overall Oral Motor/Sensory Function: Mild impairment   Ice Chips Ice chips: Within functional limits Presentation: Spoon   Thin Liquid Thin Liquid: Impaired Presentation: Cup;Straw Pharyngeal  Phase Impairments: Suspected delayed Swallow;Throat Clearing - Delayed Other Comments:  (trial with coffee stirrer straw)    Nectar Thick Nectar Thick Liquid: Not tested   Honey Thick Honey Thick Liquid: Not tested   Puree Puree: Within functional  limits Presentation: Spoon   Solid     Solid: Within functional limits Presentation: Self Fed     Thank you,  Genene Churn, [place]  Oather Mu[place] 09/04/2020,10:54 AM

## 2020-09-05 DIAGNOSIS — J189 Pneumonia, unspecified organism: Secondary | ICD-10-CM | POA: Diagnosis not present

## 2020-09-05 DIAGNOSIS — J4540 Moderate persistent asthma, uncomplicated: Secondary | ICD-10-CM | POA: Diagnosis not present

## 2020-09-05 DIAGNOSIS — I10 Essential (primary) hypertension: Secondary | ICD-10-CM | POA: Diagnosis not present

## 2020-09-05 DIAGNOSIS — E038 Other specified hypothyroidism: Secondary | ICD-10-CM | POA: Diagnosis not present

## 2020-09-05 LAB — CBC WITH DIFFERENTIAL/PLATELET
Abs Immature Granulocytes: 0.16 10*3/uL — ABNORMAL HIGH (ref 0.00–0.07)
Band Neutrophils: 8 %
Basophils Absolute: 0 10*3/uL (ref 0.0–0.1)
Basophils Relative: 0 %
Blasts: 0 %
Eosinophils Absolute: 0 10*3/uL (ref 0.0–0.5)
Eosinophils Relative: 0 %
HCT: 28 % — ABNORMAL LOW (ref 36.0–46.0)
Hemoglobin: 9.3 g/dL — ABNORMAL LOW (ref 12.0–15.0)
Lymphocytes Relative: 15 %
Lymphs Abs: 0.8 10*3/uL (ref 0.7–4.0)
MCH: 36.3 pg — ABNORMAL HIGH (ref 26.0–34.0)
MCHC: 33.2 g/dL (ref 30.0–36.0)
MCV: 109.4 fL — ABNORMAL HIGH (ref 80.0–100.0)
Metamyelocytes Relative: 2 %
Monocytes Absolute: 0.1 10*3/uL (ref 0.1–1.0)
Monocytes Relative: 1 %
Myelocytes: 1 %
Neutro Abs: 4.2 10*3/uL (ref 1.7–7.7)
Neutrophils Relative %: 73 %
Other: 0 %
Platelets: 122 10*3/uL — ABNORMAL LOW (ref 150–400)
Promyelocytes Relative: 0 %
RBC: 2.56 MIL/uL — ABNORMAL LOW (ref 3.87–5.11)
RDW: 13.7 % (ref 11.5–15.5)
WBC: 5.3 10*3/uL (ref 4.0–10.5)
nRBC: 0 /100 WBC
nRBC: 0.4 % — ABNORMAL HIGH (ref 0.0–0.2)

## 2020-09-05 LAB — COMPREHENSIVE METABOLIC PANEL
ALT: 21 U/L (ref 0–44)
AST: 21 U/L (ref 15–41)
Albumin: 2.6 g/dL — ABNORMAL LOW (ref 3.5–5.0)
Alkaline Phosphatase: 50 U/L (ref 38–126)
Anion gap: 9 (ref 5–15)
BUN: 14 mg/dL (ref 8–23)
CO2: 26 mmol/L (ref 22–32)
Calcium: 9 mg/dL (ref 8.9–10.3)
Chloride: 105 mmol/L (ref 98–111)
Creatinine, Ser: 0.47 mg/dL (ref 0.44–1.00)
GFR, Estimated: >60 mL/min (ref >60)
Glucose, Bld: 230 mg/dL — ABNORMAL HIGH (ref 70–99)
Potassium: 3.5 mmol/L (ref 3.5–5.1)
Sodium: 140 mmol/L (ref 135–145)
Total Bilirubin: 0.9 mg/dL (ref 0.3–1.2)
Total Protein: 6 g/dL — ABNORMAL LOW (ref 6.5–8.1)

## 2020-09-05 LAB — HEMOGLOBIN A1C
Hgb A1c MFr Bld: 5.7 % — ABNORMAL HIGH (ref 4.8–5.6)
Mean Plasma Glucose: 117 mg/dL

## 2020-09-05 LAB — MAGNESIUM: Magnesium: 2 mg/dL (ref 1.7–2.4)

## 2020-09-05 LAB — GLUCOSE, CAPILLARY
Glucose-Capillary: 241 mg/dL — ABNORMAL HIGH (ref 70–99)
Glucose-Capillary: 308 mg/dL — ABNORMAL HIGH (ref 70–99)
Glucose-Capillary: 302 mg/dL — ABNORMAL HIGH (ref 70–99)
Glucose-Capillary: 319 mg/dL — ABNORMAL HIGH (ref 70–99)

## 2020-09-05 LAB — PHOSPHORUS: Phosphorus: 2.7 mg/dL (ref 2.5–4.6)

## 2020-09-05 LAB — T4, FREE: Free T4: 1.19 ng/dL — ABNORMAL HIGH (ref 0.61–1.12)

## 2020-09-05 MED ORDER — METOPROLOL TARTRATE 50 MG PO TABS
50.0000 mg | ORAL_TABLET | Freq: Two times a day (BID) | ORAL | Status: DC
  Administered 2020-09-06: 50 mg via ORAL
  Filled 2020-09-05: qty 1

## 2020-09-05 MED ORDER — SODIUM CHLORIDE 0.9 % IV BOLUS
500.0000 mL | Freq: Once | INTRAVENOUS | Status: AC
  Administered 2020-09-05: 500 mL via INTRAVENOUS

## 2020-09-05 MED ORDER — VALPROATE SODIUM 500 MG/5ML IV SOLN
INTRAVENOUS | Status: AC
  Filled 2020-09-05: qty 10

## 2020-09-05 MED ORDER — LEVALBUTEROL HCL 1.25 MG/0.5ML IN NEBU
1.2500 mg | INHALATION_SOLUTION | Freq: Three times a day (TID) | RESPIRATORY_TRACT | Status: DC
  Administered 2020-09-05 – 2020-09-06 (×4): 1.25 mg via RESPIRATORY_TRACT
  Filled 2020-09-05 (×4): qty 0.5

## 2020-09-05 MED ORDER — IPRATROPIUM BROMIDE 0.02 % IN SOLN
0.5000 mg | Freq: Three times a day (TID) | RESPIRATORY_TRACT | Status: DC
  Administered 2020-09-05 – 2020-09-06 (×4): 0.5 mg via RESPIRATORY_TRACT
  Filled 2020-09-05 (×4): qty 2.5

## 2020-09-05 MED ORDER — DILTIAZEM HCL 60 MG PO TABS
60.0000 mg | ORAL_TABLET | Freq: Three times a day (TID) | ORAL | Status: DC
  Administered 2020-09-05 – 2020-09-06 (×3): 60 mg via ORAL
  Filled 2020-09-05 (×3): qty 1

## 2020-09-05 NOTE — Progress Notes (Signed)
PROGRESS NOTE    [name]  WPV:[id number] DOB: 06/16/1950 DOA: 09/02/2020 PCP: Abran Richard, MD     Brief Narrative:   70 y.o. WF PMHx Depression, COPD, asthma with chronic respiratory failure on 2 L O2 at home, OSA depression, HTN, thrombocytopenia, aspiration pneumonia, tremor with deep brain stimulator.  Patient presented to the ED for complaints of difficulty breathing, with cough productive of yellowish sputum.  Patient is able to answer very few questions, as at the time of my evaluation, patient starts coughing significantly limiting further history.  Patient tells me she has been having difficulty swallowing liquids, for which she had a barium swallow done.  She has not had any problems with solids.  This is a chronic issue for patient.  She denies vomiting. At the facility, patient's O2 sat had to be increased to 4 L  ED Course: Febrile - 101.2, tachycardic to 126, respiratory rate 18-28, blood pressure systolic 74-827.  O2 sats 93 - 97% on 4 L.  WBC 9.1.  Lactic acid 1.  UA ketones protein and rare bacteria.  Chest CT shows multilobar bilateral pneumonia most severe in the right lower lobe.  Small parapneumonic right-sided pleural effusion. IV vancomycin cefepime and metronidazole started.  1 L bolus given and Ringer's lactate at 150 cc/h started.  Hospitalist to admit.   Subjective:  The patient was seen and examined this morning, awake alert oriented mentation has much improved.  Blood pressure remains low 82/63, but asymptomatic  Improved hypoxia currently satting 98% on room air, was weaned off oxygen successfully     Assessment & Plan: Covid vaccination; vaccinated 3/3   Principal Problem:   Pneumonia Active Problems:   Essential hypertension   Hypothyroidism   Asthma, moderate persistent, poorly-controlled   Obstructive sleep apnea   Type II diabetes mellitus with manifestations ([name])   Thrombocytopenia ([place])  Multilobar bilateral pneumonia with  sepsis-  -moved sepsis pathophysiology, hemodynamically stable exception of blood pressure mildly on the low side 82/63 improved with bolus IV fluid 500 mL of normal saline blood pressure currently 133/50, afebrile  -Much improved tachycardia, tachypnea  -upon admission met criteria for sepsis: HR> 90, RR > 20, T> 38 C, site of infection lungs.  P - Chest CT without contrast shows multilobar bilateral pneumonia most severe in the right lower lobe, no parapneumonic right-sided pleural effusion.  COVID and influenza test negative. -Start IV Unasyn for aspiration pneumonia + IV azithromycin for atypical coverage - 0.9% saline with KCl 40 mEq 127m/hr >>> will change to fluids to LR, -Lactic acid 1.4, procalcitonin 0.18, WBC 4.3, -Influenza A/B negative, SARS-CoV-2 negative -DuoNeb - Flutter valve - Incentive spirometry - Solu-Medrol 60 mg BID >> continue - 5/31 sputum still pending - Status post speech evaluation, no significant aspiration  Hypokalemia -Potassium 3.5 this a.m. -Potassium IV 60 mEq 09/03/2020 - Recheck K/Mg - repleted accordingly  Thrombocytopenia (chronic) -unknown physiology - Baseline platelets 82-128 -Currently 71, 86 >>> 122 - Currently no obvious sites of bleeding but will need to monitor closely -Antiplatelet therapy  Esophageal wall thickening -Per CT chest, circumferential thickening of the distal third of the esophagus differentials reflux esophagitis versus esophageal metaplasia or neoplasia, further evaluation with nonemergent endoscopy should be considered.  Last EGD by Dr. RGala Romney2020  - mild [name] ring and erosive gastropathy. -Follow-up as outpatient with her gastroenterologist  Asthma, obstructive sleep apnea, chronic respiratory failure- -On 2 L O2 per [place] at baseline,  -Was on BiPAP then weaned down to  4 L, then 2 L, this morning on room air was satting 98% -Continue O2 supplement as needed to maintain O2 sat greater 92% -DuoNebs as needed and  scheduled, resume home bronchodilators -Titrate O2 to maintain SPO2> 92%  Hypertension -tensive -Blood pressure has been running soft but improved this a.m. -Hold metoprolol for now  sinus tachycardia - EKGs >>> sinus tachycardia -A. fib was ruled out - Cardizem drip >>> was discontinued, was placed on small dose 60 mg of Cardizem every 8 hours (Will monitor heart rate after metoprolol initiated, we may take the patient off Cardizem altogether)  DM type II uncontrolled with complication -07/08/1538 hemoglobin A1c SSI= 7.4- - 5/31 hemoglobin A1c pending - 5/31 lipid panel, LDL 70  - Moderate SSI  Seizures-  - Depacon 500 mg TID  Bipolar disorder -Hold home sertraline and Seroquel for now while n.p.o. pending swallow evaluation  Hypothyroidism -Hold Synthroid until patient passes swallow evaluation  Tremor-  -Deep brain stimulator.  Sees [name]. -Resume primidone when able      DVT prophylaxis: SCD Code Status: Full Family Communication:  Status is: Inpatient    Dispo: The patient is from: Home              Anticipated d/c is to: Home              Anticipated d/c date is: 6/3              Patient currently unstable      Consultants:    Procedures/Significant Events:  1/30 okay CTabdomen and chest  WO contrast Multilobar bilateral pneumonia, most severe in the right lower lobe. Small parapneumonic right-sided pleural effusion. 2. Circumferential thickening of the distal third of the esophagus. This may simply reflect reflux esophagitis, however, if there is any clinical concern for esophageal metaplasia or neoplasia, further evaluation with nonemergent endoscopy should be considered. 3. No acute findings are noted in the abdomen or pelvis to account for the patient's symptoms. 4. Colonic diverticulosis without evidence of acute diverticulitis at this time. 5. Aortic atherosclerosis, in addition to left anterior descending and left circumflex  coronary artery disease. Please note that although the presence of coronary artery calcium documents the presence of coronary artery disease, the severity of this disease and any potential stenosis cannot be assessed on this non-gated CT examination. Assessment for potential risk factor modification, dietary therapy or pharmacologic therapy may be warranted, if clinically indicated.   I have personally reviewed and interpreted all radiology studies and my findings are as above.  VENTILATOR SETTINGS: Nasal cannula 5/31 Flow 4 L/min SPO2 97%   Cultures   Antimicrobials: Anti-infectives (From admission, onward)   Start     Ordered Stop   09/02/20 2200  ceFEPIme (MAXIPIME) 2 g in sodium chloride 0.9 % 100 mL IVPB  Status:  Discontinued        09/02/20 1304 09/02/20 1659   09/02/20 2100  vancomycin (VANCOCIN) IVPB 1000 mg/200 mL premix  Status:  Discontinued        09/02/20 1304 09/02/20 1659   09/02/20 1900  azithromycin (ZITHROMAX) 500 mg in sodium chloride 0.9 % 250 mL IVPB        09/02/20 1844     09/02/20 1730  Ampicillin-Sulbactam (UNASYN) 3 g in sodium chloride 0.9 % 100 mL IVPB        09/02/20 1710     09/02/20 1315  ceFEPIme (MAXIPIME) 2 g in sodium chloride 0.9 % 100 mL IVPB  09/02/20 1302 09/02/20 1432   09/02/20 1315  metroNIDAZOLE (FLAGYL) IVPB 500 mg        09/02/20 1302 09/02/20 1534   09/02/20 1315  vancomycin (VANCOCIN) IVPB 1000 mg/200 mL premix        09/02/20 1302 09/02/20 1432       Devices    LINES / TUBES:      Continuous Infusions: . ampicillin-sulbactam (UNASYN) IV 3 g (09/05/20 1108)  . azithromycin Stopped (09/04/20 2036)  . valproate sodium Stopped (09/05/20 0821)     Objective: Vitals:   09/05/20 1105 09/05/20 1130 09/05/20 1200 09/05/20 1309  BP: (!) 133/103  (!) 133/50   Pulse: 83  87   Resp: (!) 27  15   Temp:  98.5 F (36.9 C)    TempSrc:  Oral    SpO2: 96%  96% 98%  Weight:      Height:        Intake/Output  Summary (Last 24 hours) at 09/05/2020 1400 Last data filed at 09/05/2020 1100 Gross per 24 hour  Intake 2080.65 ml  Output 1400 ml  Net 680.65 ml   Filed Weights   09/02/20 1107 09/03/20 1444  Weight: 80.6 kg 82.2 kg       Physical Exam:   General:  Alert, oriented, cooperative, no distress;   HEENT:  Normocephalic, PERRL, otherwise with in Normal limits   Neuro:  CNII-XII intact. , normal motor and sensation, reflexes intact   Lungs:   Clear to auscultation BL, Respirations unlabored, no wheezes / crackles  Cardio:    S1/S2, RRR, No murmure, No Rubs or Gallops   Abdomen:   Soft, non-tender, bowel sounds active all four quadrants,  no guarding or peritoneal signs.  Muscular skeletal:  Limited exam - in bed, able to move all 4 extremities, Normal strength,  2+ pulses,  symmetric, No pitting edema  Skin:  Dry, warm to touch, negative for any Rashes,  Wounds: Please see nursing documentation          Data Reviewed: Care during the described time interval was provided by me .  I have reviewed this patient's available data, including medical history, events of note, physical examination, and all test results as part of my evaluation.  CBC: Recent Labs  Lab 09/02/20 1133 09/03/20 0611 09/04/20 0453 09/05/20 0437  WBC 9.1 6.2 4.3 5.3  NEUTROABS 7.3  --  3.5 4.2  HGB 11.1* 9.6* 8.7* 9.3*  HCT 32.7* 29.8* 26.8* 28.0*  MCV 110.1* 114.2* 112.1* 109.4*  PLT 83* 71* 86* 034*   Basic Metabolic Panel: Recent Labs  Lab 09/02/20 1133 09/02/20 1653 09/03/20 0611 09/03/20 1550 09/04/20 0453 09/05/20 0437  NA 134*  --  140  --  138 140  K 3.2*  --  3.4* 3.7 3.8 3.5  CL 99  --  106  --  104 105  CO2 23  --  27  --  24 26  GLUCOSE 211*  --  160*  --  224* 230*  BUN 10  --  7*  --  10 14  CREATININE 0.43*  --  0.44  --  0.38* 0.47  CALCIUM 8.7*  --  8.1*  --  8.9 9.0  MG  --  1.6*  --  1.8 1.8 2.0  PHOS  --   --   --   --  3.1 2.7   GFR: Estimated Creatinine Clearance: 66.9  mL/min (by C-G formula based on SCr of 0.47 mg/dL). Liver  Function Tests: Recent Labs  Lab 09/02/20 1133 09/04/20 0453 09/05/20 0437  AST 12* 20 21  ALT _0 ALKPHOS 45 48 50  BILITOT 1.6* 1.0 0.9  PROT 6.7 6.0* 6.0*  ALBUMIN 3.1* 2.6* 2.6*   Recent Labs  Lab 09/02/20 1133  LIPASE 22   No results for input(s): AMMONIA in the last 168 hours. Coagulation Profile: No results for input(s): INR, PROTIME in the last 168 hours. Cardiac Enzymes: No results for input(s): CKTOTAL, CKMB, CKMBINDEX, TROPONINI in the last 168 hours. BNP (last 3 results) No results for input(s): PROBNP in the last 8760 hours. HbA1C: Recent Labs    09/02/20 1711 09/04/20 0453  HGBA1C 5.6 5.7*   CBG: Recent Labs  Lab 09/04/20 1715 09/04/20 2017 09/04/20 2321 09/05/20 0746 09/05/20 1129  GLUCAP 222* 281* 257* 241* 308*   Lipid Profile: Recent Labs    09/04/20 0453  CHOL 136  HDL 51  LDLCALC 70  TRIG 74  CHOLHDL 2.7   Thyroid Function Tests: Recent Labs    09/04/20 0929  TSH 0.060*   Anemia Panel: No results for input(s): VITAMINB12, FOLATE, FERRITIN, TIBC, IRON, RETICCTPCT in the last 72 hours. Sepsis Labs: Recent Labs  Lab 09/02/20 1135 09/04/20 0453 09/04/20 0929  PROCALCITON  --  0.18  --   LATICACIDVEN 1.0  --  1.4    Recent Results (from the past 240 hour(s))  Resp Panel by RT-PCR (Flu A&B, Covid) Nasopharyngeal Swab     Status: None   Collection Time: 09/02/20 10:59 AM   Specimen: Nasopharyngeal Swab; Nasopharyngeal(NP) swabs in vial transport medium  Result Value Ref Range Status   SARS Coronavirus 2 by RT PCR NEGATIVE NEGATIVE Final    Comment: (NOTE) SARS-CoV-2 target nucleic acids are NOT DETECTED.  The SARS-CoV-2 RNA is generally detectable in upper respiratory specimens during the acute phase of infection. The lowest concentration of SARS-CoV-2 viral copies this assay can detect is 138 copies/mL. A negative result does not preclude  SARS-Cov-2 infection and should not be used as the sole basis for treatment or other patient management decisions. A negative result may occur with  improper specimen collection/handling, submission of specimen other than nasopharyngeal swab, presence of viral mutation(s) within the areas targeted by this assay, and inadequate number of viral copies(<138 copies/mL). A negative result must be combined with clinical observations, patient history, and epidemiological information. The expected result is Negative.  Fact Sheet for Patients:  EntrepreneurPulse.com.au  Fact Sheet for Healthcare Providers:  IncredibleEmployment.be  This test is no t yet approved or cleared by the [place] FDA and  has been authorized for detection and/or diagnosis of SARS-CoV-2 by FDA under an Emergency Use Authorization (EUA). This EUA will remain  in effect (meaning this test can be used) for the duration of the COVID-19 declaration under Section 564(b)(1) of the Act, 21 U.S.C.section 360bbb-3(b)(1), unless the authorization is terminated  or revoked sooner.       Influenza A by PCR NEGATIVE NEGATIVE Final   Influenza B by PCR NEGATIVE NEGATIVE Final    Comment: (NOTE) The Xpert Xpress SARS-CoV-2/FLU/RSV plus assay is intended as an aid in the diagnosis of influenza from Nasopharyngeal swab specimens and should not be used as a sole basis for treatment. Nasal washings and aspirates are unacceptable for Xpert Xpress SARS-CoV-2/FLU/RSV testing.  Fact Sheet for Patients: EntrepreneurPulse.com.au  Fact Sheet for Healthcare Providers: IncredibleEmployment.be  This test is not yet approved or cleared by the [place] and  has been authorized for detection and/or diagnosis of SARS-CoV-2 by FDA under an Emergency Use Authorization (EUA). This EUA will remain in effect (meaning this test can be used) for the duration of  the COVID-19 declaration under Section 564(b)(1) of the Act, 21 U.S.C. section 360bbb-3(b)(1), unless the authorization is terminated or revoked.  Performed at [facility], [address], [place], [place] [address]   Blood Culture (routine x 2)     Status: None (Preliminary result)   Collection Time: 09/02/20  1:16 PM   Specimen: BLOOD RIGHT WRIST  Result Value Ref Range Status   Specimen Description   Final    BLOOD RIGHT WRIST BOTTLES DRAWN AEROBIC AND ANAEROBIC   Special Requests Blood Culture adequate volume  Final   Culture   Final    NO GROWTH 3 DAYS Performed at [facility], [address] [place], [place] [address]    Report Status PENDING  Incomplete  Blood Culture (routine x 2)     Status: None (Preliminary result)   Collection Time: 09/02/20  1:16 PM   Specimen: Right Antecubital; Blood  Result Value Ref Range Status   Specimen Description   Final    RIGHT ANTECUBITAL BOTTLES DRAWN AEROBIC AND ANAEROBIC   Special Requests Blood Culture adequate volume  Final   Culture   Final    NO GROWTH 3 DAYS Performed at [facility], [address], [place], [place] [address]    Report Status PENDING  Incomplete  Urine culture     Status: None   Collection Time: 09/02/20  2:10 PM   Specimen: Urine, Catheterized  Result Value Ref Range Status   Specimen Description   Final    URINE, CATHETERIZED Performed at [facility], [address] [place], [place] [address]    Special Requests   Final    NONE Performed at [facility], [address], [place], [place] [address]    Culture   Final    NO GROWTH Performed at [facility], [place] [address], [place], [place] [address]    Report Status 09/04/2020 FINAL  Final  MRSA PCR Screening     Status: Abnormal   Collection Time: 09/03/20  3:34 AM   Specimen: Nasopharyngeal  Result Value Ref Range Status   MRSA by PCR POSITIVE (A) NEGATIVE Final    Comment:        The GeneXpert MRSA Assay (FDA approved for NASAL  specimens only), is one component of a comprehensive MRSA colonization surveillance program. It is not intended to diagnose MRSA infection nor to guide or monitor treatment for MRSA infections. RESULT CALLED TO, READ BACK BY AND VERIFIED WITH: A ROGERS,RN_0  09/03/20 MKELLY Performed at [facility], [address] [place], [place] [address]   Respiratory (~20 pathogens) panel by PCR     Status: None   Collection Time: 09/03/20  1:15 PM   Specimen: Nasopharyngeal Swab; Respiratory  Result Value Ref Range Status   Adenovirus NOT DETECTED NOT DETECTED Final   Coronavirus 229E NOT DETECTED NOT DETECTED Final    Comment: (NOTE) The Coronavirus on the Respiratory Panel, DOES NOT test for the novel  Coronavirus (2019 nCoV)    Coronavirus HKU1 NOT DETECTED NOT DETECTED Final   Coronavirus NL63 NOT DETECTED NOT DETECTED Final   Coronavirus OC43 NOT DETECTED NOT DETECTED Final   Metapneumovirus NOT DETECTED NOT DETECTED Final   Rhinovirus / Enterovirus NOT DETECTED NOT DETECTED Final   Influenza A NOT DETECTED NOT DETECTED Final  Influenza B NOT DETECTED NOT DETECTED Final   Parainfluenza Virus 1 NOT DETECTED NOT DETECTED Final   Parainfluenza Virus 2 NOT DETECTED NOT DETECTED Final   Parainfluenza Virus 3 NOT DETECTED NOT DETECTED Final   Parainfluenza Virus 4 NOT DETECTED NOT DETECTED Final   Respiratory Syncytial Virus NOT DETECTED NOT DETECTED Final   Bordetella pertussis NOT DETECTED NOT DETECTED Final   Bordetella Parapertussis NOT DETECTED NOT DETECTED Final   Chlamydophila pneumoniae NOT DETECTED NOT DETECTED Final   Mycoplasma pneumoniae NOT DETECTED NOT DETECTED Final    Comment: Performed at [facility], [place] [address] [place], [place] [address]  Expectorated Sputum Assessment w Gram Stain, Rflx to Resp Cult     Status: None   Collection Time: 09/03/20  1:22 PM   Specimen: Sputum  Result Value Ref Range Status   Specimen Description SPUTUM  Final   Special  Requests NONE  Final   Sputum evaluation   Final    Sputum specimen not acceptable for testing.  Please recollect.   Performed at [facility], [address] [place], [place] [address]    Report Status 09/03/2020 FINAL  Final         Radiology Studies: DG CHEST PORT 1 VIEW  Result Date: 09/04/2020 CLINICAL DATA:  Shortness of breath, COPD EXAM: PORTABLE CHEST 1 VIEW COMPARISON:  09/02/2020 FINDINGS: No focal consolidation. No pleural effusion or pneumothorax. Heart and mediastinal contours are unremarkable. Deep brain stimulator power packs projecting over the right and left anterior chest walls. No acute osseous abnormality. IMPRESSION: No active disease. Electronically Signed   By: Kathreen Devoid   On: 09/04/2020 09:49   DG Swallowing Func-Speech Pathology  Result Date: 09/04/2020 Objective Swallowing Evaluation: Type of Study: MBS-Modified Barium Swallow Study  Patient Details Name: [name] MRN: [id number] Date of Birth: [birth year] Today's Date: 09/04/2020 Time: SLP Start Time (ACUTE ONLY): 1210 -SLP Stop Time (ACUTE ONLY): 1238 SLP Time Calculation (min) (ACUTE ONLY): 28 min Past Medical History: Past Medical History: Diagnosis Date . Allergic rhinitis   uses Nasonex daily as needed . Allergy to angiotensin receptor blockers (ARB) 01/22/2014  Angioedema . Anxiety   takes Ativan daily as needed . Aspiration pneumonia ([place])  . Asthma   Albuterol inhaler and Neb daily as needed . 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 [name], residual palpitations . Bipolar 1 disorder ([place])  . Chronic back pain   reason unknown . Constipation  . COPD (chronic obstructive pulmonary disease) ([place])  . DDD (degenerative disc disease), lumbar  . Depression   takes Zoloft daily as well as Cymbalta . Diabetes mellitus   Type II . Dysphagia  . Dysrhythmia   SVT ==ablation done by Dr. Rayann Heman 2007 . Esophageal reflux   takes Zantac daily . Frequent headaches  . History of  bronchitis Dec 2014 . Hypertension   takes Losartan daily . Hypothyroidism   takes Synthroid daily . Joint swelling   left ankle . Low sodium syndrome  . Lumbar radiculopathy  . Seizures ([place]) 04-05-13  one time ran out of Primidone and had stopped it cold [place] . SVT (supraventricular tachycardia) ([place])  . Tremor, essential   takes Mysoline and Neurontin daily. . Vocal cord dysfunction  . Weakness   in left arm Past Surgical History: Past Surgical History: Procedure Laterality Date . ABDOMINAL HYSTERECTOMY   . ABLATION FOR AVNRT   . APPENDECTOMY   . BARTHOLIN GLAND CYST  EXCISION    x 2 . BIOPSY  03/04/2015  Procedure: BIOPSY (Duodenal, Gastric);  Surgeon: Daneil Dolin, MD;  Location: [facility];  Service: Endoscopy;; . BIOPSY  12/19/2018  Procedure: BIOPSY;  Surgeon: Daneil Dolin, MD;  [facility];  Service: Endoscopy;;  Gastric Biopsies  . BREAST SURGERY   . CESAREAN SECTION   . CHOLECYSTECTOMY   . COLONOSCOPY  01/16/2004  TMH:DQQIWL rectum/colon . COLONOSCOPY WITH PROPOFOL N/A 03/04/2015  Dr.[name]- internal hemorrhoids, pancolonic diverticulosis, colonic polyp= tubular adenoma . DEEP BRAIN STIMULATOR PLACEMENT   . DENTAL SURGERY   . ESOPHAGEAL DILATION N/A 03/04/2015  Procedure: ESOPHAGEAL DILATION WITH 54FR MALONEY DILATOR;  Surgeon: Daneil Dolin, MD;  Location: [facility];  Service: Endoscopy;  Laterality: N/A; . ESOPHAGEAL DILATION  12/19/2018  Procedure: ESOPHAGEAL DILATION;  Surgeon: Daneil Dolin, MD;  [facility];  Service: Endoscopy;; . ESOPHAGOGASTRODUODENOSCOPY (EGD) WITH ESOPHAGEAL DILATION  04/03/2002  NLG:[id number]'E ring, otherwise normal esophagus, status post dilation with 56 F/Normal stomach . ESOPHAGOGASTRODUODENOSCOPY (EGD) WITH ESOPHAGEAL DILATION N/A 11/08/2012  Dr. [name]:schatzki's ring s/p dilation/hiatal hernia . ESOPHAGOGASTRODUODENOSCOPY (EGD) WITH PROPOFOL N/A 03/04/2015  Dr.[name]- schatzki's ring, hiatal hernia, scattered erosions bx= chronic inactive gastritis .  ESOPHAGOGASTRODUODENOSCOPY (EGD) WITH PROPOFOL N/A 05/06/2017  Normal esophagus. Dilated. Small hiatal hernia. Normal duodenal bulb and second portion of duodenum. . ESOPHAGOGASTRODUODENOSCOPY (EGD) WITH PROPOFOL N/A 12/19/2018  [name]: Mild [name] ring status post dilation, multiple erosions in the stomach, biopsies negative. Marland Kitchen FINGER SURGERY   . HEMORRHOID BANDING  2017  Dr.[name] . LEFT ANKLE LIGAMENT REPAIR    x 2 . LEFT ELBOW REPAIR   . MALONEY DILATION N/A 05/06/2017  Procedure: Venia Minks DILATION;  Surgeon: Daneil Dolin, MD;  [facility];  Service: Endoscopy;  Laterality: N/A; . Venia Minks DILATION N/A 12/19/2018  Procedure: Venia Minks DILATION;  Surgeon: Daneil Dolin, MD;  [facility];  Service: Endoscopy;  Laterality: N/A; . MAXIMUM ACCESS (MAS)POSTERIOR LUMBAR INTERBODY FUSION (PLIF) 1 LEVEL N/A 04/24/2014  Procedure: Lumbar four-five Maximum access Surgery posterior lumbar interbody fusion;  Surgeon: [name], MD;  Location: [place] [facility];  Service: Neurosurgery;  Laterality: N/A;  Lumbar four-five Maximum access Surgery posterior lumbar interbody fusion . MULTIPLE EXTRACTIONS WITH ALVEOLOPLASTY N/A 06/18/2017  Procedure: MULTIPLE EXTRACTION;  Surgeon: Diona Browner, DDS;  Location: [place];  Service: Oral Surgery;  Laterality: N/A; . PARTIAL HYSTERECTOMY   . POLYPECTOMY  03/04/2015  Procedure: POLYPECTOMY (descending colon);  Surgeon: Daneil Dolin, MD;  Location: [facility];  Service: Endoscopy;; . PULSE GENERATOR IMPLANT Bilateral 12/15/2013  Procedure: BILATERAL PULSE GENERATOR IMPLANT;  Surgeon: [name], MD;  Location: [place] [facility];  Service: Neurosurgery;  Laterality: Bilateral;  BILATERAL . RIGHT BREAST CYST    benign . SUBTHALAMIC STIMULATOR BATTERY REPLACEMENT Bilateral 06/24/2018  Procedure: Bilateral implantable pulse generator battery change;  Surgeon: [name], MD;  Location: [place];  Service: Neurosurgery;  Laterality: Bilateral;  bilateral . SUBTHALAMIC STIMULATOR  INSERTION Bilateral 12/04/2013  Procedure: Bilateral Deep brain stimulator placement;  Surgeon: [name], MD;  Location: [place] [facility];  Service: Neurosurgery;  Laterality: Bilateral;  Bilateral Deep brain stimulator placement . TONSILLECTOMY   HPI: 70 y.o. WF PMHx Depression, COPD, asthma with chronic respiratory failure on 2 L O2 at home, OSA depression, HTN, thrombocytopenia, aspiration pneumonia, tremor with deep brain stimulator. Patient presented to the ED for complaints of difficulty breathing, with cough productive of yellowish sputum. Pt had EGD September 2020, mild [name] ring  status post dilation.  Multiple erosions in the stomach, biopsies negative. MBSS December 2020 with mild pharyngeal aspiration with wet quality to improve after clearing of the throat recommended regular textures and thin liquids but no straws and standard aspiration reflux precautions. Per CT chest, circumferential thickening of the distal third of the esophagus differentials reflux esophagitis versus esophageal metaplasia or neoplasia, further evaluation with nonemergent endoscopy should be considered. BSE requested.  Subjective: "I have had trouble swallowing for years." Assessment / Plan / Recommendation CHL IP CLINICAL IMPRESSIONS 09/04/2020 Clinical Impression Pt presents with mild oropharyngeal phase dysphagia with impaired lingual movement resulting in premature spillage of liquids (pt unable to hold thin liquids in oral cavity without spilling over base of tongue despite cues) and prolonged oral transit of solids, min delay in swallow initiation but more due to premature spillage with reduced laryngeal vestibule closure resulting in trace penetration and aspiration of thins (below the cords and then expelled with occasional verbal cue) and min vallecular residue. No appreciable difference with NTL. Esophageal sweep revealed delayed emptying and retrograde bolus movement in distal esophagus. Recommend regular textures and  thin liquids when Pt is sitting upright, ok for small sips via straw and po medications whole with straw, pt to clear throat/cough intermittently and repeat swallow. SLP Visit Diagnosis Dysphagia, oropharyngeal phase (R13.12) Attention and concentration deficit following -- Frontal lobe and executive function deficit following -- Impact on safety and function Mild aspiration risk   [facility] IP TREATMENT RECOMMENDATION 09/04/2020 Treatment Recommendations Therapy as outlined in treatment plan below   Prognosis 09/04/2020 Prognosis for Safe Diet Advancement Fair Barriers to Reach Goals Severity of deficits Barriers/Prognosis Comment -- [facility] IP DIET RECOMMENDATION 09/04/2020 SLP Diet Recommendations Regular solids;Thin liquid Liquid Administration via Cup;Straw Medication Administration Whole meds with liquid Compensations Small sips/bites;Slow rate;Clear throat intermittently Postural Changes Remain semi-upright after after feeds/meals (Comment);Seated upright at 90 degrees   CH[facility] IP OTHER RECOMMENDATIONS 09/04/2020 Recommended Consults -- Oral Care Recommendations Oral care BID;Staff/trained caregiver to provide oral care Other Recommendations Clarify dietary restrictions   [facility] IP FOLLOW UP RECOMMENDATIONS 09/04/2020 Follow up Recommendations Skilled Nursing facility   [facility] IP FREQUENCY AND DURATION 09/04/2020 Speech Therapy Frequency (ACUTE ONLY) min 2x/week Treatment Duration 1 week      [facility] IP ORAL PHASE 09/04/2020 Oral Phase Impaired Oral - Pudding Teaspoon -- Oral - Pudding Cup -- Oral - Honey Teaspoon -- Oral - Honey Cup -- Oral - Nectar Teaspoon -- Oral - Nectar Cup -- Oral - Nectar Straw Piecemeal swallowing;Lingual/palatal residue Oral - Thin Teaspoon Lingual/palatal residue;Piecemeal swallowing;Premature spillage Oral - Thin Cup -- Oral - Thin Straw Lingual/palatal residue;Piecemeal swallowing;Premature spillage Oral - Puree WFL Oral - Mech Soft -- Oral - Regular Delayed oral transit;Piecemeal swallowing Oral - Multi-Consistency  -- Oral - Pill WFL Oral Phase - Comment --  [facility] IP PHARYNGEAL PHASE 09/04/2020 Pharyngeal Phase Impaired Pharyngeal- Pudding Teaspoon -- Pharyngeal -- Pharyngeal- Pudding Cup -- Pharyngeal -- Pharyngeal- Honey Teaspoon -- Pharyngeal -- Pharyngeal- Honey Cup -- Pharyngeal -- Pharyngeal- Nectar Teaspoon -- Pharyngeal -- Pharyngeal- Nectar Cup -- Pharyngeal -- Pharyngeal- Nectar Straw Delayed swallow initiation-pyriform sinuses;Pharyngeal residue - valleculae Pharyngeal -- Pharyngeal- Thin Teaspoon Delayed swallow initiation-pyriform sinuses Pharyngeal -- Pharyngeal- Thin Cup -- Pharyngeal -- Pharyngeal- Thin Straw Delayed swallow initiation-pyriform sinuses;Penetration/Aspiration during swallow;Trace aspiration;Pharyngeal residue - valleculae Pharyngeal Material does not enter airway;Material enters airway, passes BELOW cords then ejected out Pharyngeal- Puree Delayed swallow initiation-vallecula Pharyngeal -- Pharyngeal- Mechanical Soft -- Pharyngeal -- Pharyngeal- Regular Delayed swallow  initiation-vallecula Pharyngeal -- Pharyngeal- Multi-consistency -- Pharyngeal -- Pharyngeal- Pill WFL Pharyngeal -- Pharyngeal Comment --  [facility] IP CERVICAL ESOPHAGEAL PHASE 09/04/2020 Cervical Esophageal Phase WFL Pudding Teaspoon -- Pudding Cup -- Honey Teaspoon -- Honey Cup -- Nectar Teaspoon -- Nectar Cup -- Nectar Straw -- Thin Teaspoon -- Thin Cup -- Thin Straw -- Puree -- Mechanical Soft -- Regular -- Multi-consistency -- Pill -- Cervical Esophageal Comment -- Thank you, Genene Churn, [place] PORTER,DABNEY 09/04/2020, 1:11 PM                   Scheduled Meds: . acidophilus  1 capsule Oral TID WC  . Chlorhexidine Gluconate Cloth  6 each Topical Q0600  . diltiazem  60 mg Oral Q8H  . fluticasone furoate-vilanterol  1 puff Inhalation Daily  . insulin aspart  0-15 Units Subcutaneous TID WC  . insulin aspart  0-5 Units Subcutaneous QHS  . ipratropium  0.5 mg Nebulization TID  . levalbuterol  1.25 mg  Nebulization TID  . levothyroxine  75 mcg Oral Q0600  . methylPREDNISolone (SOLU-MEDROL) injection  60 mg Intravenous Q12H  . [START ON 09/06/2020] metoprolol tartrate  50 mg Oral BID  . mupirocin ointment  1 application Nasal BID   Continuous Infusions: . ampicillin-sulbactam (UNASYN) IV 3 g (09/05/20 1108)  . azithromycin Stopped (09/04/20 2036)  . valproate sodium Stopped (09/05/20 0821)     LOS: 3 days    Time spent:40 min    Deatra James, MD [facility]   If 7PM-7AM, please contact night-coverage 09/05/2020, 2:00 PM

## 2020-09-05 NOTE — Progress Notes (Signed)
Inpatient Diabetes Program Recommendations  AACE/ADA: New Consensus Statement on Inpatient Glycemic Control (2015)  Target Ranges:  Prepandial:   less than 140 mg/dL      Peak postprandial:   less than 180 mg/dL (1-2 hours)      Critically ill patients:  140 - 180 mg/dL   Lab Results  Component Value Date   GLUCAP 308 (H) 09/05/2020   HGBA1C 5.7 (H) 09/04/2020    Review of Glycemic Control  Diabetes history: DM2 Outpatient Diabetes medications: Lantus 15 units QHS (hold if CBG < 160), Humalog 2-10 units TID with meals, metformin 500 mg BID, Januvia 100 mg QD Current orders for Inpatient glycemic control: Novolog 0-15 units TID with meals and 0-5 HS  HgbA1C - 5.7% - doubt this is accurate with low H/H CBGs 230, 241, 308 mg/dL  Inpatient Diabetes Program Recommendations:     Add Lantus 10 units QHS Add Novolog 4 units TID with meals if eating > 50% meal Change diet to CHO mod med  Will continue to follow glucose trends.  Thank you. Lorenda Peck, RD, LDN, CDE Inpatient Diabetes Coordinator [phone]

## 2020-09-06 DIAGNOSIS — E038 Other specified hypothyroidism: Secondary | ICD-10-CM | POA: Diagnosis not present

## 2020-09-06 DIAGNOSIS — J4540 Moderate persistent asthma, uncomplicated: Secondary | ICD-10-CM | POA: Diagnosis not present

## 2020-09-06 DIAGNOSIS — I10 Essential (primary) hypertension: Secondary | ICD-10-CM | POA: Diagnosis not present

## 2020-09-06 DIAGNOSIS — J189 Pneumonia, unspecified organism: Secondary | ICD-10-CM | POA: Diagnosis not present

## 2020-09-06 LAB — CBC WITH DIFFERENTIAL/PLATELET
Abs Immature Granulocytes: 0.13 10*3/uL — ABNORMAL HIGH (ref 0.00–0.07)
Basophils Absolute: 0 10*3/uL (ref 0.0–0.1)
Basophils Relative: 1 %
Eosinophils Absolute: 0 10*3/uL (ref 0.0–0.5)
Eosinophils Relative: 0 %
HCT: 25.6 % — ABNORMAL LOW (ref 36.0–46.0)
Hemoglobin: 8.5 g/dL — ABNORMAL LOW (ref 12.0–15.0)
Immature Granulocytes: 3 %
Lymphocytes Relative: 22 %
Lymphs Abs: 1 10*3/uL (ref 0.7–4.0)
MCH: 36.8 pg — ABNORMAL HIGH (ref 26.0–34.0)
MCHC: 33.2 g/dL (ref 30.0–36.0)
MCV: 110.8 fL — ABNORMAL HIGH (ref 80.0–100.0)
Monocytes Absolute: 0.1 10*3/uL (ref 0.1–1.0)
Monocytes Relative: 3 %
Neutro Abs: 3 10*3/uL (ref 1.7–7.7)
Neutrophils Relative %: 71 %
Platelets: 102 10*3/uL — ABNORMAL LOW (ref 150–400)
RBC: 2.31 MIL/uL — ABNORMAL LOW (ref 3.87–5.11)
RDW: 13.7 % (ref 11.5–15.5)
WBC: 4.3 10*3/uL (ref 4.0–10.5)
nRBC: 0.5 % — ABNORMAL HIGH (ref 0.0–0.2)

## 2020-09-06 LAB — COMPREHENSIVE METABOLIC PANEL
ALT: 14 U/L (ref 0–44)
AST: 14 U/L — ABNORMAL LOW (ref 15–41)
Albumin: 2.5 g/dL — ABNORMAL LOW (ref 3.5–5.0)
Alkaline Phosphatase: 42 U/L (ref 38–126)
Anion gap: 10 (ref 5–15)
BUN: 11 mg/dL (ref 8–23)
CO2: 26 mmol/L (ref 22–32)
Calcium: 8.5 mg/dL — ABNORMAL LOW (ref 8.9–10.3)
Chloride: 100 mmol/L (ref 98–111)
Creatinine, Ser: 0.38 mg/dL — ABNORMAL LOW (ref 0.44–1.00)
GFR, Estimated: >60 mL/min (ref >60)
Glucose, Bld: 283 mg/dL — ABNORMAL HIGH (ref 70–99)
Potassium: 3.4 mmol/L — ABNORMAL LOW (ref 3.5–5.1)
Sodium: 136 mmol/L (ref 135–145)
Total Bilirubin: 1 mg/dL (ref 0.3–1.2)
Total Protein: 5.5 g/dL — ABNORMAL LOW (ref 6.5–8.1)

## 2020-09-06 LAB — GLUCOSE, CAPILLARY
Glucose-Capillary: 285 mg/dL — ABNORMAL HIGH (ref 70–99)
Glucose-Capillary: 312 mg/dL — ABNORMAL HIGH (ref 70–99)
Glucose-Capillary: 241 mg/dL — ABNORMAL HIGH (ref 70–99)

## 2020-09-06 LAB — RESP PANEL BY RT-PCR (FLU A&B, COVID) ARPGX2
Influenza A by PCR: NEGATIVE
Influenza B by PCR: NEGATIVE
SARS Coronavirus 2 by RT PCR: NEGATIVE

## 2020-09-06 LAB — T3, FREE: T3, Free: 1.6 pg/mL — ABNORMAL LOW (ref 2.0–4.4)

## 2020-09-06 LAB — PHOSPHORUS: Phosphorus: 3.1 mg/dL (ref 2.5–4.6)

## 2020-09-06 LAB — MAGNESIUM: Magnesium: 1.9 mg/dL (ref 1.7–2.4)

## 2020-09-06 MED ORDER — PREDNISONE 20 MG PO TABS: 40.0000 mg | ORAL_TABLET | Freq: Every day | ORAL | Status: DC

## 2020-09-06 MED ORDER — DILTIAZEM HCL ER COATED BEADS 180 MG PO CP24: 180.0000 mg | ORAL_CAPSULE | Freq: Every day | ORAL | Status: DC

## 2020-09-06 MED ORDER — DILTIAZEM HCL ER COATED BEADS 180 MG PO CP24
180.0000 mg | ORAL_CAPSULE | Freq: Every day | ORAL | Status: DC
  Administered 2020-09-06: 180 mg via ORAL
  Filled 2020-09-06: qty 1

## 2020-09-06 MED ORDER — AMOXICILLIN-POT CLAVULANATE 875-125 MG PO TABS: 1.0000 | ORAL_TABLET | Freq: Two times a day (BID) | ORAL | 0 refills | Status: AC

## 2020-09-06 MED ORDER — METOPROLOL TARTRATE 50 MG PO TABS: 50.0000 mg | ORAL_TABLET | Freq: Two times a day (BID) | ORAL | Status: DC

## 2020-09-06 MED ORDER — LEVOTHYROXINE SODIUM 50 MCG PO TABS: 50.0000 ug | ORAL_TABLET | Freq: Every day | ORAL | Status: AC

## 2020-09-06 MED ORDER — POTASSIUM CHLORIDE CRYS ER 20 MEQ PO TBCR
40.0000 meq | EXTENDED_RELEASE_TABLET | Freq: Once | ORAL | Status: AC
  Administered 2020-09-06: 40 meq via ORAL
  Filled 2020-09-06: qty 2

## 2020-09-06 NOTE — NC FL2 (Signed)
[place] LEVEL OF CARE SCREENING TOOL     IDENTIFICATION  Patient Name: [name] Birthdate: [birth year] Sex: female Admission Date (Current Location): 09/02/2020  [facility] and [place] Number:  [employer] and Address:  [place] [address] [place]      Provider Number: [id number]  Attending Physician Name and Address:  Kathie Dike, MD  Relative Name and Phone Number:  [name] (Daughter)   [phone]    Current Level of Care: Hospital Recommended Level of Care: Nursing Facility Prior Approval Number:    Date Approved/Denied:   PASRR Number:    Discharge Plan: Other (Comment) (Nursing home)    Current Diagnoses: Patient Active Problem List   Diagnosis Date Noted  . Pneumonia 09/02/2020  . Anemia 05/14/2020  . Macrocytic anemia 09/19/2019  . Thrombocytopenia ([place]) 09/19/2019  . Chronic back pain   . Altered mental status 09/11/2019  . Opioid overdose ([place]) 09/11/2019  . Hypotension 09/11/2019  . Acute hypoxemic respiratory failure due to COVID-19 ([place]) 09/11/2019  . Uncomplicated asthma 76/16/0737  . Skin erythema 09/28/2018  . Umbilical pain 10/62/6948  . Constipation 03/18/2017  . CAP (community acquired pneumonia) 04/21/2016  . History of colonic polyps   . Diverticulosis of colon without hemorrhage   . Other hemorrhoids   . [name]'s ring   . Mucosal abnormality of stomach   . Dysphagia   . Encounter for screening colonoscopy 02/21/2015  . Upper airway cough syndrome 01/18/2015  . OAB (overactive bladder) 12/30/2014  . Incontinence in female 12/25/2014  . MDD (major depressive disorder), recurrent severe, without psychosis ([place]) 12/14/2014  . Thrush of mouth and esophagus ([place]) 11/23/2014  . Depression with somatization 11/05/2014  . Type II diabetes mellitus with manifestations ([place]) 10/23/2014  . Visit for screening mammogram 08/21/2014  . Hypokalemia 05/30/2014  . Tinea  corporis 05/30/2014  . Lumbar scoliosis 04/24/2014  . Obstructive sleep apnea 04/23/2014  . Moderate persistent asthma with acute bronchitis and acute exacerbation 03/02/2014  . Allergy to angiotensin receptor blockers (ARB) 01/22/2014  . S/P deep brain stimulator placement 01/10/2014  . Greater trochanteric bursitis of left hip 11/07/2013  . Spinal stenosis of lumbar region 10/30/2013  . Right thyroid nodule 10/12/2013  . CMC arthritis, thumb, degenerative 06/15/2013  . GERD (gastroesophageal reflux disease) 11/03/2012  . IBS (irritable bowel syndrome) 11/03/2012  . Esophageal stenosis 11/01/2012  . Asthma, moderate persistent, poorly-controlled 09/01/2012  . Hypothyroidism 12/22/2011  . Hyperlipidemia with target LDL less than 100 06/22/2008  . Essential hypertension 09/04/2007  . Tremor, essential 04/19/2007  . Seasonal and perennial allergic rhinitis 04/19/2007    Orientation RESPIRATION BLADDER Height & Weight     Self,Time,Place  O2 (2L) Incontinent Weight: 184 lb 8.4 oz (83.7 kg) Height:  5\' 4"  (162.6 cm)  BEHAVIORAL SYMPTOMS/MOOD NEUROLOGICAL BOWEL NUTRITION STATUS      Continent Diet (Regular)  AMBULATORY STATUS COMMUNICATION OF NEEDS Skin   Extensive Assist Verbally Skin abrasions,Other (Comment) (Abrasion on foot and Ecchymosis arm and back)                       Personal Care Assistance Level of Assistance  Bathing,Feeding,Dressing,Total care Bathing Assistance: Maximum assistance Feeding assistance: Independent Dressing Assistance: Limited assistance Total Care Assistance: Maximum assistance   Functional Limitations Info  Sight,Hearing,Speech Sight Info: Adequate Hearing Info: Adequate Speech Info: Impaired    SPECIAL CARE FACTORS FREQUENCY  Contractures Contractures Info: Not present    Additional Factors Info  Code Status,Allergies Code Status Info: FULL Allergies Info: Invokana (canagliflozin, losartan, metformin  and related, ace inhibitors, latex, other (air freshners), oxycodone-acetaminophen, sulfa antibiotics, mucinex (guaifenesin er)           Current Medications (09/06/2020):  This is the current hospital active medication list Current Facility-Administered Medications  Medication Dose Route Frequency Provider Last Rate Last Admin  . acetaminophen (TYLENOL) tablet 650 mg  650 mg Oral Q6H PRN Emokpae, Ejiroghene E, MD       Or  . acetaminophen (TYLENOL) suppository 650 mg  650 mg Rectal Q6H PRN Emokpae, Ejiroghene E, MD      . acidophilus (RISAQUAD) capsule 1 capsule  1 capsule Oral TID WC Shahmehdi, Seyed A, MD   1 capsule at 09/06/20 1138  . Ampicillin-Sulbactam (UNASYN) 3 g in sodium chloride 0.9 % 100 mL IVPB  3 g Intravenous Q6H Allie Bossier, MD 200 mL/hr at 09/06/20 0608 3 g at 09/06/20 0608  . azithromycin (ZITHROMAX) 500 mg in sodium chloride 0.9 % 250 mL IVPB  500 mg Intravenous Q24H Bethena Roys, MD   Stopped at 09/05/20 2222  . Chlorhexidine Gluconate Cloth 2 % PADS 6 each  6 each Topical Q0600 Zierle-Ghosh, Asia B, DO   6 each at 09/06/20 0606  . diltiazem (CARDIZEM CD) 24 hr capsule 180 mg  180 mg Oral Daily Memon, Jehanzeb, MD      . fluticasone furoate-vilanterol (BREO ELLIPTA) 100-25 MCG/INH 1 puff  1 puff Inhalation Daily Emokpae, Ejiroghene E, MD   1 puff at 09/06/20 0720  . insulin aspart (novoLOG) injection 0-15 Units  0-15 Units Subcutaneous TID WC Adefeso, Oladapo, DO   11 Units at 09/06/20 1138  . insulin aspart (novoLOG) injection 0-5 Units  0-5 Units Subcutaneous QHS Adefeso, Oladapo, DO   4 Units at 09/05/20 2126  . ipratropium (ATROVENT) nebulizer solution 0.5 mg  0.5 mg Nebulization TID Skipper Cliche A, MD   0.5 mg at 09/06/20 0720  . ipratropium-albuterol (DUONEB) 0.5-2.5 (3) MG/3ML nebulizer solution 3 mL  3 mL Nebulization Q4H PRN Emokpae, Ejiroghene E, MD   3 mL at 09/03/20 1255  . levalbuterol (XOPENEX) nebulizer solution 1.25 mg  1.25 mg Nebulization  TID Skipper Cliche A, MD   1.25 mg at 09/06/20 0720  . levothyroxine (SYNTHROID) tablet 75 mcg  75 mcg Oral Q0600 Skipper Cliche A, MD   75 mcg at 09/06/20 0606  . methylPREDNISolone sodium succinate (SOLU-MEDROL) 125 mg/2 mL injection 60 mg  60 mg Intravenous Q12H Allie Bossier, MD   60 mg at 09/06/20 0237  . metoprolol tartrate (LOPRESSOR) tablet 50 mg  50 mg Oral BID Shahmehdi, Seyed A, MD   50 mg at 09/06/20 1038  . mupirocin ointment (BACTROBAN) 2 % 1 application  1 application Nasal BID Zierle-Ghosh, Asia B, DO   1 application at 97/67/34 1038  . ondansetron (ZOFRAN) tablet 4 mg  4 mg Oral Q6H PRN Emokpae, Ejiroghene E, MD       Or  . ondansetron (ZOFRAN) injection 4 mg  4 mg Intravenous Q6H PRN Emokpae, Ejiroghene E, MD      . potassium chloride SA (KLOR-CON) CR tablet 40 mEq  40 mEq Oral Once Kathie Dike, MD      . valproate (DEPACON) 500 mg in dextrose 5 % 50 mL IVPB  500 mg Intravenous Q8H Emokpae, Ejiroghene E, MD 55 mL/hr at 09/06/20 0642 500 mg at  09/06/20 3403     Discharge Medications: Please see discharge summary for a list of discharge medications.  Relevant Imaging Results:  Relevant Lab Results:   Additional Information SSN: [place] [address] [place]

## 2020-09-06 NOTE — Care Management Important Message (Signed)
Important Message  Patient Details  Name: [name] MRN: [id number] Date of Birth: 02-28-1951   Medicare Important Message Given:  Yes     Tommy Medal 09/06/2020, 1:09 PM

## 2020-09-06 NOTE — Discharge Summary (Signed)
Physician Discharge Summary  [name] BLT:[id number] DOB: 11/12/1950 DOA: 09/02/2020  PCP: Abran Richard, MD  Admit date: 09/02/2020 Discharge date: 09/06/2020  Admitted From: Skilled nursing facility Disposition: Skilled nursing facility  Recommendations for Outpatient Follow-up:  1. Follow up with PCP in 1-2 weeks 2. Please obtain BMP/CBC in one week 3. Patient will need outpatient referral to GI for further evaluation of esophageal wall thickening.  She has previously seen Dr. Gala Romney in 2020 4. Repeat chest x-ray in 3 to 4 weeks to document resolution of pneumonia/parapneumonic effusion 5. Levothyroxine dose has been adjusted, will need repeat thyroid function studies in 4 weeks  Discharge Condition: Stable CODE STATUS: Full code Diet recommendation: Heart healthy carb modified  Brief/Interim Summary: 70 year old female with a history of COPD/asthma, chronic respiratory failure on 2 L of oxygen, obstructive sleep apnea, tremor with deep brain stimulator, thrombocytopenia, hypothyroidism, bipolar disorder, diabetes, presents to the emergency room with difficulty breathing, productive cough.  She was noted to be febrile, tachycardic.  Imaging indicated multilobar bilateral pneumonia.  She initially required BiPAP and was admitted for further antibiotic treatment.  Discharge Diagnoses:  Principal Problem:   Pneumonia Active Problems:   Essential hypertension   Hypothyroidism   Asthma, moderate persistent, poorly-controlled   Obstructive sleep apnea   Type II diabetes mellitus with manifestations ([name])   Thrombocytopenia ([name])  Sepsis secondary to multilobar bilateral pneumonia -She met criteria for sepsis and she was noted to be tachycardic, febrile, tachypneic. -She was treated with intravenous antibiotics -Overall sepsis physiology has resolved -She was treated with Augmentin, azithromycin and Solu-Medrol  Pneumonia -She is completed 5 days of Unasyn, will  transition to Augmentin -She was also treated with intravenous steroids, will transition to a short course of prednisone  Hypokalemia -Replaced  Thrombocytopenia, chronic -Appears to be near baseline, no obvious bleeding -Continue to follow as an outpatient  Hypertension -Blood pressure stable on metoprolol and Cardizem  Sinus tachycardia -Initially treated with Cardizem drip, subsequently transitioned to oral Cardizem -Heart rate is currently stable on metoprolol and Cardizem -Suspect this was driven by underlying sepsis  Type 2 diabetes -Uncontrolled, likely in the setting of steroids -Resume basal insulin on discharge with sliding scale, she is also on metformin and sitagliptin -Anticipate blood sugar should further stabilize as steroids are tapered  Bipolar disorder -Continue on Seroquel, Depakote, sertraline  History of tremors -Continue on primidone -She has a deep brain stimulator and follows with neurology  Hypothyroidism -TSH noted to be low at 0.06 -Free T4 elevated at 1.19 -Levothyroxine dose decreased from 29mg to 567m -She will need repeat thyroid function studies in 4 weeks  Chronic respiratory failure with asthma -Initially required BiPAP therapy, subsequently weaned back down to her baseline requirement of 2 L -She is continued on bronchodilators -She was receiving intravenous steroids, but has been transitioned to prednisone  Esophageal wall thickening -Incidental finding on CT chest -Continue on PPI -Further follow-up with GI as an outpatient for EGD   Discharge Instructions  Discharge Instructions    Diet - low sodium heart healthy   Complete by: As directed    Increase activity slowly   Complete by: As directed      Allergies as of 09/06/2020      Reactions   Invokana [canagliflozin] Other (See Comments)   Yeast infectios   Losartan Other (See Comments)   Angioedema   Metformin And Related Diarrhea   Ace Inhibitors    History of  angioedema with losartan, ARB  Latex Other (See Comments)   Other Reaction: redness, blisters   Other Other (See Comments)   Air fresheners - on MAR   Oxycodone-acetaminophen Itching, Other (See Comments)   TYLOX. Caused internal itching per patient    Sulfa Antibiotics Other (See Comments)   Mucinex [guaifenesin Er] Other (See Comments)   MUCINEX REACTION: asthma attacks      Medication List    STOP taking these medications   ibuprofen 800 MG tablet Commonly known as: ADVIL   metoprolol succinate 50 MG 24 hr tablet Commonly known as: TOPROL-XL     TAKE these medications   acetaminophen 500 MG tablet Commonly known as: TYLENOL Take 500 mg by mouth every 6 (six) hours as needed for mild pain, fever or headache.   albuterol 108 (90 Base) MCG/ACT inhaler Commonly known as: ProAir HFA Inhale 2 puffs every 6 hours for asthma - rescue inhaler   alum & mag hydroxide-simeth 200-200-20 MG/5ML suspension Commonly known as: MAALOX/MYLANTA Take 30 mLs by mouth as needed for indigestion or heartburn. Max 4 doses per 24 hours   amoxicillin-clavulanate 875-125 MG tablet Commonly known as: Augmentin Take 1 tablet by mouth 2 (two) times daily for 5 days.   aspirin 81 MG chewable tablet Chew 81 mg by mouth in the morning.   baclofen 10 MG tablet Commonly known as: LIORESAL Take 5 mg by mouth at bedtime.   Biofreeze 4 % Gel Generic drug: Menthol (Topical Analgesic) Apply topically 2 (two) times daily. Apply to lower back topically 2 times a day for back pain.   Carboxymethylcellulose Sod PF 0.5 % Soln Place 1 drop into both eyes 2 (two) times daily.   diltiazem 180 MG 24 hr capsule Commonly known as: CARDIZEM CD Take 1 capsule (180 mg total) by mouth daily.   divalproex 500 MG DR tablet Commonly known as: DEPAKOTE Take 500 mg by mouth 3 (three) times daily.   docusate sodium 100 MG capsule Commonly known as: COLACE Take 100 mg by mouth in the morning.   fluticasone  furoate-vilanterol 100-25 MCG/INH Aepb Commonly known as: Breo Ellipta Inhale 1 puff into the lungs daily.   gabapentin 300 MG capsule Commonly known as: NEURONTIN Take 300 mg by mouth See admin instructions. Take 300 mg by mouth in the morning and 600 mg at bedtime   Glucagon Emergency 1 MG Kit Inject as directed. Inject 1 mg intramuscularly every 24 hours as needed for low blood glucose blood glucose below 60 and unable to take oral glucose replacement.   guaiFENesin-dextromethorphan 100-10 MG/5ML syrup Commonly known as: ROBITUSSIN DM Take 5 mLs by mouth every 4 (four) hours as needed for cough.   insulin glargine 100 UNIT/ML injection Commonly known as: LANTUS Inject 15 Units into the skin at bedtime. *Hold if blood glucose is less than 160*   insulin lispro 100 UNIT/ML injection Commonly known as: HUMALOG Inject into the skin 3 (three) times daily before meals. 0-59 call MD 60-200=0 201-250= 2 units 251-300=4 units 301-350= 6 units 351-400= 8 units 401-450= 10 units 451+ Call MD   ipratropium 0.03 % nasal spray Commonly known as: ATROVENT Place 2 sprays into both nostrils 3 (three) times daily. Before meals   ipratropium-albuterol 0.5-2.5 (3) MG/3ML Soln Commonly known as: DUONEB Take 3 mLs by nebulization. 3 ml inhale orally every 6 hours as needed for SOB/wheezing   lactulose 10 GM/15ML solution Commonly known as: CHRONULAC Take 10 g by mouth daily as needed for mild constipation.   levothyroxine 50  MCG tablet Commonly known as: SYNTHROID Take 1 tablet (50 mcg total) by mouth daily before breakfast. What changed:   medication strength  See the new instructions.   loperamide 2 MG tablet Commonly known as: IMODIUM A-D Take 2 mg by mouth 3 (three) times daily as needed for diarrhea or loose stools.   lubiprostone 24 MCG capsule Commonly known as: AMITIZA Take 1 capsule (24 mcg total) by mouth 2 (two) times daily with a meal.   magnesium hydroxide 400  MG/5ML suspension Commonly known as: MILK OF MAGNESIA Take 30 mLs by mouth at bedtime as needed for mild constipation.   metFORMIN 500 MG tablet Commonly known as: GLUCOPHAGE Take by mouth 2 (two) times daily with a meal.   metoprolol tartrate 50 MG tablet Commonly known as: LOPRESSOR Take 1 tablet (50 mg total) by mouth 2 (two) times daily.   omega-3 acid ethyl esters 1 g capsule Commonly known as: LOVAZA Take 1 g by mouth daily.   pantoprazole 40 MG tablet Commonly known as: PROTONIX Take 40 mg by mouth daily before breakfast.   polyethylene glycol 17 g packet Commonly known as: MIRALAX / GLYCOLAX Take 17 g by mouth daily as needed for moderate constipation.   polyethylene glycol-electrolytes 420 g solution Commonly known as: NuLYTELY As directed   potassium chloride SA 20 MEQ tablet Commonly known as: KLOR-CON Take 20 mEq by mouth daily.   predniSONE 20 MG tablet Commonly known as: DELTASONE Take 2 tablets (40 mg total) by mouth daily. 3 days   primidone 50 MG tablet Commonly known as: MYSOLINE Take 50 mg by mouth in the morning.   QUEtiapine 100 MG tablet Commonly known as: SEROQUEL Take 100 mg by mouth at bedtime.   senna 8.6 MG Tabs tablet Commonly known as: SENOKOT Take 2 tablets by mouth daily as needed for mild constipation.   sertraline 50 MG tablet Commonly known as: ZOLOFT Take 50 mg by mouth daily.   sitaGLIPtin 100 MG tablet Commonly known as: JANUVIA Take 100 mg by mouth daily.   sodium chloride 0.65 % nasal spray Commonly known as: OCEAN Place 2 sprays into the nose as needed (Rhinitis). Both nostrils   sodium chloride 1 g tablet Take 1 g by mouth 2 (two) times daily.   Systane 0.4-0.3 % Soln Generic drug: Polyethyl Glycol-Propyl Glycol Apply to eye as needed.   valbenazine 40 MG capsule Commonly known as: INGREZZA Take 40 mg by mouth daily.       Allergies  Allergen Reactions  . Invokana [Canagliflozin] Other (See Comments)     Yeast infectios  . Losartan Other (See Comments)    Angioedema   . Metformin And Related Diarrhea  . Ace Inhibitors     History of angioedema with losartan, ARB  . Latex Other (See Comments)    Other Reaction: redness, blisters  . Other Other (See Comments)    Air fresheners - on MAR  . Oxycodone-Acetaminophen Itching and Other (See Comments)    TYLOX. Caused internal itching per patient   . Sulfa Antibiotics Other (See Comments)  . Mucinex [Guaifenesin Er] Other (See Comments)    MUCINEX REACTION: asthma attacks    Consultations:     Procedures/Studies: CT ABDOMEN PELVIS WO CONTRAST  Result Date: 09/02/2020 CLINICAL DATA:  70 year old female with history of acute nonlocalized abdominal pain. Shortness of breath and productive cough with yellow sputum. EXAM: CT CHEST, ABDOMEN AND PELVIS WITHOUT CONTRAST TECHNIQUE: Multidetector CT imaging of the chest, abdomen and pelvis  was performed following the standard protocol without IV contrast. COMPARISON:  CT the abdomen and pelvis 10/01/2017. FINDINGS: CT CHEST FINDINGS Cardiovascular: Heart size is normal. There is no significant pericardial fluid, thickening or pericardial calcification. There is aortic atherosclerosis, as well as atherosclerosis of the great vessels of the mediastinum and the coronary arteries, including calcified atherosclerotic plaque in the left anterior descending and left circumflex coronary arteries. Mediastinum/Nodes: No pathologically enlarged mediastinal or hilar lymph nodes. Please note that accurate exclusion of hilar adenopathy is limited on noncontrast CT scans. Thickening of the distal esophagus. No axillary lymphadenopathy. Lungs/Pleura: Extensive airspace consolidation noted in the lungs, most confluent in the right lower lobe but also involving the posterior aspect of the right upper lobe and left lower lobe. Small right pleural effusion. No definite suspicious appearing pulmonary nodules or masses are  noted. Musculoskeletal: There are no aggressive appearing lytic or blastic lesions noted in the visualized portions of the skeleton. CT ABDOMEN PELVIS FINDINGS Hepatobiliary: No definite suspicious cystic or solid hepatic lesions are confidently identified on today's noncontrast CT examination. Status post cholecystectomy. Pancreas: No definite pancreatic mass or peripancreatic fluid collections or inflammatory changes are noted on today's noncontrast CT examination. Spleen: Unremarkable. Adrenals/Urinary Tract: Unenhanced appearance of the kidneys and bilateral adrenal glands is normal. No hydroureteronephrosis. Small amount of gas non dependently within the lumen of the urinary bladder. Urinary bladder is otherwise normal in appearance. Stomach/Bowel: Unenhanced appearance of the stomach is normal. There is no pathologic dilatation of small bowel or colon. Numerous colonic diverticulae are noted, without surrounding inflammatory changes to suggest an acute diverticulitis at this time. The appendix is not confidently identified and may be surgically absent. Regardless, there are no inflammatory changes noted adjacent to the cecum to suggest the presence of an acute appendicitis at this time. Vascular/Lymphatic: Aortic atherosclerosis. No lymphadenopathy noted in the abdomen or pelvis. Reproductive: Status post hysterectomy. Right ovary is atrophic. Low-attenuation lesions in the left ovary, decreased in size compared to the prior study, largest of which measures 4.2 x 2.7 cm (axial image 96 of series 2), incompletely characterize, but presumably benign given the slight regression. Other: No significant volume of ascites.  No pneumoperitoneum. Musculoskeletal: Status post PLIF at L4-L5. There are no aggressive appearing lytic or blastic lesions noted in the visualized portions of the skeleton. IMPRESSION: 1. Multilobar bilateral pneumonia, most severe in the right lower lobe. Small parapneumonic right-sided pleural  effusion. 2. Circumferential thickening of the distal third of the esophagus. This may simply reflect reflux esophagitis, however, if there is any clinical concern for esophageal metaplasia or neoplasia, further evaluation with nonemergent endoscopy should be considered. 3. No acute findings are noted in the abdomen or pelvis to account for the patient's symptoms. 4. Colonic diverticulosis without evidence of acute diverticulitis at this time. 5. Aortic atherosclerosis, in addition to left anterior descending and left circumflex coronary artery disease. Please note that although the presence of coronary artery calcium documents the presence of coronary artery disease, the severity of this disease and any potential stenosis cannot be assessed on this non-gated CT examination. Assessment for potential risk factor modification, dietary therapy or pharmacologic therapy may be warranted, if clinically indicated. Electronically Signed   By: Vinnie Langton M.D.   On: 09/02/2020 14:49   DG Chest 1 View  Result Date: 09/02/2020 CLINICAL DATA:  Short of breath.  Productive cough. EXAM: CHEST  1 VIEW COMPARISON:  11/19/2019. FINDINGS: Cardiac silhouette is normal in size. No mediastinal or hilar masses.  Mild linear opacity extends inferolaterally from the right hilum consistent with atelectasis. Mild prominence of the interstitial markings bilaterally, stable. Lungs otherwise clear. No convincing pleural effusion.  No pneumothorax. Skeletal structures are grossly intact. IMPRESSION: No acute cardiopulmonary disease. Electronically Signed   By: Lajean Manes M.D.   On: 09/02/2020 12:01   CT Chest Wo Contrast  Result Date: 09/02/2020 CLINICAL DATA:  70 year old female with history of acute nonlocalized abdominal pain. Shortness of breath and productive cough with yellow sputum. EXAM: CT CHEST, ABDOMEN AND PELVIS WITHOUT CONTRAST TECHNIQUE: Multidetector CT imaging of the chest, abdomen and pelvis was performed  following the standard protocol without IV contrast. COMPARISON:  CT the abdomen and pelvis 10/01/2017. FINDINGS: CT CHEST FINDINGS Cardiovascular: Heart size is normal. There is no significant pericardial fluid, thickening or pericardial calcification. There is aortic atherosclerosis, as well as atherosclerosis of the great vessels of the mediastinum and the coronary arteries, including calcified atherosclerotic plaque in the left anterior descending and left circumflex coronary arteries. Mediastinum/Nodes: No pathologically enlarged mediastinal or hilar lymph nodes. Please note that accurate exclusion of hilar adenopathy is limited on noncontrast CT scans. Thickening of the distal esophagus. No axillary lymphadenopathy. Lungs/Pleura: Extensive airspace consolidation noted in the lungs, most confluent in the right lower lobe but also involving the posterior aspect of the right upper lobe and left lower lobe. Small right pleural effusion. No definite suspicious appearing pulmonary nodules or masses are noted. Musculoskeletal: There are no aggressive appearing lytic or blastic lesions noted in the visualized portions of the skeleton. CT ABDOMEN PELVIS FINDINGS Hepatobiliary: No definite suspicious cystic or solid hepatic lesions are confidently identified on today's noncontrast CT examination. Status post cholecystectomy. Pancreas: No definite pancreatic mass or peripancreatic fluid collections or inflammatory changes are noted on today's noncontrast CT examination. Spleen: Unremarkable. Adrenals/Urinary Tract: Unenhanced appearance of the kidneys and bilateral adrenal glands is normal. No hydroureteronephrosis. Small amount of gas non dependently within the lumen of the urinary bladder. Urinary bladder is otherwise normal in appearance. Stomach/Bowel: Unenhanced appearance of the stomach is normal. There is no pathologic dilatation of small bowel or colon. Numerous colonic diverticulae are noted, without surrounding  inflammatory changes to suggest an acute diverticulitis at this time. The appendix is not confidently identified and may be surgically absent. Regardless, there are no inflammatory changes noted adjacent to the cecum to suggest the presence of an acute appendicitis at this time. Vascular/Lymphatic: Aortic atherosclerosis. No lymphadenopathy noted in the abdomen or pelvis. Reproductive: Status post hysterectomy. Right ovary is atrophic. Low-attenuation lesions in the left ovary, decreased in size compared to the prior study, largest of which measures 4.2 x 2.7 cm (axial image 96 of series 2), incompletely characterize, but presumably benign given the slight regression. Other: No significant volume of ascites.  No pneumoperitoneum. Musculoskeletal: Status post PLIF at L4-L5. There are no aggressive appearing lytic or blastic lesions noted in the visualized portions of the skeleton. IMPRESSION: 1. Multilobar bilateral pneumonia, most severe in the right lower lobe. Small parapneumonic right-sided pleural effusion. 2. Circumferential thickening of the distal third of the esophagus. This may simply reflect reflux esophagitis, however, if there is any clinical concern for esophageal metaplasia or neoplasia, further evaluation with nonemergent endoscopy should be considered. 3. No acute findings are noted in the abdomen or pelvis to account for the patient's symptoms. 4. Colonic diverticulosis without evidence of acute diverticulitis at this time. 5. Aortic atherosclerosis, in addition to left anterior descending and left circumflex coronary artery disease. Please  note that although the presence of coronary artery calcium documents the presence of coronary artery disease, the severity of this disease and any potential stenosis cannot be assessed on this non-gated CT examination. Assessment for potential risk factor modification, dietary therapy or pharmacologic therapy may be warranted, if clinically indicated.  Electronically Signed   By: Vinnie Langton M.D.   On: 09/02/2020 14:49   DG CHEST PORT 1 VIEW  Result Date: 09/04/2020 CLINICAL DATA:  Shortness of breath, COPD EXAM: PORTABLE CHEST 1 VIEW COMPARISON:  09/02/2020 FINDINGS: No focal consolidation. No pleural effusion or pneumothorax. Heart and mediastinal contours are unremarkable. Deep brain stimulator power packs projecting over the right and left anterior chest walls. No acute osseous abnormality. IMPRESSION: No active disease. Electronically Signed   By: Kathreen Devoid   On: 09/04/2020 09:49   DG Swallowing Func-Speech Pathology  Result Date: 09/04/2020 Objective Swallowing Evaluation: Type of Study: MBS-Modified Barium Swallow Study  Patient Details Name: Rosena Bartle MRN: [id number] Date of Birth: Oct 25, 1950 Today's Date: 09/04/2020 Time: SLP Start Time (ACUTE ONLY): 1210 -SLP Stop Time (ACUTE ONLY): 1238 SLP Time Calculation (min) (ACUTE ONLY): 28 min Past Medical History: Past Medical History: Diagnosis Date . Allergic rhinitis   uses Nasonex daily as needed . Allergy to angiotensin receptor blockers (ARB) 01/22/2014  Angioedema . Anxiety   takes Ativan daily as needed . Aspiration pneumonia ([place])  . Asthma   Albuterol inhaler and Neb daily as needed . 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 [name], residual palpitations . Bipolar 1 disorder ([place])  . Chronic back pain   reason unknown . Constipation  . COPD (chronic obstructive pulmonary disease) ([place])  . DDD (degenerative disc disease), lumbar  . Depression   takes Zoloft daily as well as Cymbalta . Diabetes mellitus   Type II . Dysphagia  . Dysrhythmia   SVT ==ablation done by Dr. Rayann Heman 2007 . Esophageal reflux   takes Zantac daily . Frequent headaches  . History of bronchitis Dec 2014 . Hypertension   takes Losartan daily . Hypothyroidism   takes Synthroid daily . Joint swelling   left ankle . Low sodium syndrome  . Lumbar radiculopathy  . Seizures  ([place]) 04-05-13  one time ran out of Primidone and had stopped it cold [place] . SVT (supraventricular tachycardia) ([place])  . Tremor, essential   takes Mysoline and Neurontin daily. . Vocal cord dysfunction  . Weakness   in left arm Past Surgical History: Past Surgical History: Procedure Laterality Date . ABDOMINAL HYSTERECTOMY   . ABLATION FOR AVNRT   . APPENDECTOMY   . BARTHOLIN GLAND CYST EXCISION    x 2 . BIOPSY  03/04/2015  Procedure: BIOPSY (Duodenal, Gastric);  Surgeon: Daneil Dolin, MD;  Location: [facility];  Service: Endoscopy;; . BIOPSY  12/19/2018  Procedure: BIOPSY;  Surgeon: Daneil Dolin, MD;  Location: [facility];  Service: Endoscopy;;  Gastric Biopsies  . BREAST SURGERY   . CESAREAN SECTION   . CHOLECYSTECTOMY   . COLONOSCOPY  01/16/2004  KGS:[id number] rectum/colon . COLONOSCOPY WITH PROPOFOL N/A 03/04/2015  Dr.[name]- internal hemorrhoids, pancolonic diverticulosis, colonic polyp= tubular adenoma . DEEP BRAIN STIMULATOR PLACEMENT   . DENTAL SURGERY   . ESOPHAGEAL DILATION N/A 03/04/2015  Procedure: ESOPHAGEAL DILATION WITH 54FR MALONEY DILATOR;  Surgeon: Daneil Dolin, MD;  Location: [facility];  Service: Endoscopy;  Laterality: N/A; . ESOPHAGEAL DILATION  12/19/2018  Procedure: ESOPHAGEAL DILATION;  Surgeon: Manus Rudd  M, MD;  Location: [facility];  Service: Endoscopy;; . ESOPHAGOGASTRODUODENOSCOPY (EGD) WITH ESOPHAGEAL DILATION  04/03/2002  LZJ:[id number] ring, otherwise normal esophagus, status post dilation with 56 F/Normal stomach . ESOPHAGOGASTRODUODENOSCOPY (EGD) WITH ESOPHAGEAL DILATION N/A 11/08/2012  Dr. [name]:schatzki's ring s/p dilation/hiatal hernia . ESOPHAGOGASTRODUODENOSCOPY (EGD) WITH PROPOFOL N/A 03/04/2015  Dr.[name]- schatzki's ring, hiatal hernia, scattered erosions bx= chronic inactive gastritis . ESOPHAGOGASTRODUODENOSCOPY (EGD) WITH PROPOFOL N/A 05/06/2017  Normal esophagus. Dilated. Small hiatal hernia. Normal duodenal bulb and second portion of duodenum. .  ESOPHAGOGASTRODUODENOSCOPY (EGD) WITH PROPOFOL N/A 12/19/2018  [name]: Mild [name] ring status post dilation, multiple erosions in the stomach, biopsies negative. Marland Kitchen FINGER SURGERY   . HEMORRHOID BANDING  2017  Dr.[name] . LEFT ANKLE LIGAMENT REPAIR    x 2 . LEFT ELBOW REPAIR   . MALONEY DILATION N/A 05/06/2017  Procedure: Venia Minks DILATION;  Surgeon: Daneil Dolin, MD;  [facility];  Service: Endoscopy;  Laterality: N/A; . Venia Minks DILATION N/A 12/19/2018  Procedure: Venia Minks DILATION;  Surgeon: Daneil Dolin, MD;  [facility];  Service: Endoscopy;  Laterality: N/A; . MAXIMUM ACCESS (MAS)POSTERIOR LUMBAR INTERBODY FUSION (PLIF) 1 LEVEL N/A 04/24/2014  Procedure: Lumbar four-five Maximum access Surgery posterior lumbar interbody fusion;  Surgeon: [name], MD;  Location: [facility];  Service: Neurosurgery;  Laterality: N/A;  Lumbar four-five Maximum access Surgery posterior lumbar interbody fusion . MULTIPLE EXTRACTIONS WITH ALVEOLOPLASTY N/A 06/18/2017  Procedure: MULTIPLE EXTRACTION;  Surgeon: Diona Browner, DDS;  Location: [facility];  Service: Oral Surgery;  Laterality: N/A; . PARTIAL HYSTERECTOMY   . POLYPECTOMY  03/04/2015  Procedure: POLYPECTOMY (descending colon);  Surgeon: Daneil Dolin, MD;  Location: [facility];  Service: Endoscopy;; . PULSE GENERATOR IMPLANT Bilateral 12/15/2013  Procedure: BILATERAL PULSE GENERATOR IMPLANT;  Surgeon: [name], MD;  Location: [facility];  Service: Neurosurgery;  Laterality: Bilateral;  BILATERAL . RIGHT BREAST CYST    benign . SUBTHALAMIC STIMULATOR BATTERY REPLACEMENT Bilateral 06/24/2018  Procedure: Bilateral implantable pulse generator battery change;  Surgeon: [name], MD;  Location: [facility];  Service: Neurosurgery;  Laterality: Bilateral;  bilateral . SUBTHALAMIC STIMULATOR INSERTION Bilateral 12/04/2013  Procedure: Bilateral Deep brain stimulator placement;  Surgeon: [name], MD;  Location: [facility];  Service: Neurosurgery;   Laterality: Bilateral;  Bilateral Deep brain stimulator placement . TONSILLECTOMY   HPI: 70 y.o. WF PMHx Depression, COPD, asthma with chronic respiratory failure on 2 L O2 at home, OSA depression, HTN, thrombocytopenia, aspiration pneumonia, tremor with deep brain stimulator. Patient presented to the ED for complaints of difficulty breathing, with cough productive of yellowish sputum. Pt had EGD September 2020, mild [name] ring status post dilation.  Multiple erosions in the stomach, biopsies negative. MBSS December 2020 with mild pharyngeal aspiration with wet quality to improve after clearing of the throat recommended regular textures and thin liquids but no straws and standard aspiration reflux precautions. Per CT chest, circumferential thickening of the distal third of the esophagus differentials reflux esophagitis versus esophageal metaplasia or neoplasia, further evaluation with nonemergent endoscopy should be considered. BSE requested.  Subjective: "I have had trouble swallowing for years." Assessment / Plan / Recommendation [facility] IP CLINICAL IMPRESSIONS 09/04/2020 Clinical Impression Pt presents with mild oropharyngeal phase dysphagia with impaired lingual movement resulting in premature spillage of liquids (pt unable to hold thin liquids in oral cavity without spilling over base of tongue despite cues) and prolonged oral transit of solids, min delay in swallow initiation but more due to premature spillage  with reduced laryngeal vestibule closure resulting in trace penetration and aspiration of thins (below the cords and then expelled with occasional verbal cue) and min vallecular residue. No appreciable difference with NTL. Esophageal sweep revealed delayed emptying and retrograde bolus movement in distal esophagus. Recommend regular textures and thin liquids when Pt is sitting upright, ok for small sips via straw and po medications whole with straw, pt to clear throat/cough intermittently and repeat  swallow. SLP Visit Diagnosis Dysphagia, oropharyngeal phase (R13.12) Attention and concentration deficit following -- Frontal lobe and executive function deficit following -- Impact on safety and function Mild aspiration risk   CH[facility] IP TREATMENT RECOMMENDATION 09/04/2020 Treatment Recommendations Therapy as outlined in treatment plan below   Prognosis 09/04/2020 Prognosis for Safe Diet Advancement Fair Barriers to Reach Goals Severity of deficits Barriers/Prognosis Comment -- [facility] IP DIET RECOMMENDATION 09/04/2020 SLP Diet Recommendations Regular solids;Thin liquid Liquid Administration via Cup;Straw Medication Administration Whole meds with liquid Compensations Small sips/bites;Slow rate;Clear throat intermittently Postural Changes Remain semi-upright after after feeds/meals (Comment);Seated upright at 90 degrees   CH[facility] IP OTHER RECOMMENDATIONS 09/04/2020 Recommended Consults -- Oral Care Recommendations Oral care BID;Staff/trained caregiver to provide oral care Other Recommendations Clarify dietary restrictions   [facility] IP FOLLOW UP RECOMMENDATIONS 09/04/2020 Follow up Recommendations Skilled Nursing facility   [facility] IP FREQUENCY AND DURATION 09/04/2020 Speech Therapy Frequency (ACUTE ONLY) min 2x/week Treatment Duration 1 week      CH[facility] IP ORAL PHASE 09/04/2020 Oral Phase Impaired Oral - Pudding Teaspoon -- Oral - Pudding Cup -- Oral - Honey Teaspoon -- Oral - Honey Cup -- Oral - Nectar Teaspoon -- Oral - Nectar Cup -- Oral - Nectar Straw Piecemeal swallowing;Lingual/palatal residue Oral - Thin Teaspoon Lingual/palatal residue;Piecemeal swallowing;Premature spillage Oral - Thin Cup -- Oral - Thin Straw Lingual/palatal residue;Piecemeal swallowing;Premature spillage Oral - Puree WFL Oral - Mech Soft -- Oral - Regular Delayed oral transit;Piecemeal swallowing Oral - Multi-Consistency -- Oral - Pill WFL Oral Phase - Comment --  CH[facility] IP PHARYNGEAL PHASE 09/04/2020 Pharyngeal Phase Impaired Pharyngeal- Pudding Teaspoon -- Pharyngeal --  Pharyngeal- Pudding Cup -- Pharyngeal -- Pharyngeal- Honey Teaspoon -- Pharyngeal -- Pharyngeal- Honey Cup -- Pharyngeal -- Pharyngeal- Nectar Teaspoon -- Pharyngeal -- Pharyngeal- Nectar Cup -- Pharyngeal -- Pharyngeal- Nectar Straw Delayed swallow initiation-pyriform sinuses;Pharyngeal residue - valleculae Pharyngeal -- Pharyngeal- Thin Teaspoon Delayed swallow initiation-pyriform sinuses Pharyngeal -- Pharyngeal- Thin Cup -- Pharyngeal -- Pharyngeal- Thin Straw Delayed swallow initiation-pyriform sinuses;Penetration/Aspiration during swallow;Trace aspiration;Pharyngeal residue - valleculae Pharyngeal Material does not enter airway;Material enters airway, passes BELOW cords then ejected out Pharyngeal- Puree Delayed swallow initiation-vallecula Pharyngeal -- Pharyngeal- Mechanical Soft -- Pharyngeal -- Pharyngeal- Regular Delayed swallow initiation-vallecula Pharyngeal -- Pharyngeal- Multi-consistency -- Pharyngeal -- Pharyngeal- Pill WFL Pharyngeal -- Pharyngeal Comment --  CH[facility] IP CERVICAL ESOPHAGEAL PHASE 09/04/2020 Cervical Esophageal Phase WFL Pudding Teaspoon -- Pudding Cup -- Honey Teaspoon -- Honey Cup -- Nectar Teaspoon -- Nectar Cup -- Nectar Straw -- Thin Teaspoon -- Thin Cup -- Thin Straw -- Puree -- Mechanical Soft -- Regular -- Multi-consistency -- Pill -- Cervical Esophageal Comment -- Thank you, Genene Churn, [place] PORTER,DABNEY 09/04/2020, 1:11 PM                 Subjective: Patient is feeling better today.  She feels that her cough has improved.  She is currently seen on room air appears to be breathing comfortably.  Discharge Exam: Vitals:   09/06/20 0528 09/06/20 0605 09/06/20 0720 09/06/20 1038  BP:  (!) 145/67  Marland Kitchen)  156/95  Pulse:    81  Resp:      Temp: 97.7 F (36.5 C)     TempSrc: Axillary     SpO2:   97%   Weight: 83.7 kg     Height:        General: Pt is alert, awake, not in acute distress Cardiovascular: RRR, S1/S2 +, no rubs, no gallops Respiratory:  CTA bilaterally, no wheezing, no rhonchi Abdominal: Soft, NT, ND, bowel sounds + Extremities: no edema, no cyanosis    The results of significant diagnostics from this hospitalization (including imaging, microbiology, ancillary and laboratory) are listed below for reference.     Microbiology: Recent Results (from the past 240 hour(s))  Resp Panel by RT-PCR (Flu A&B, Covid) Nasopharyngeal Swab     Status: None   Collection Time: 09/02/20 10:59 AM   Specimen: Nasopharyngeal Swab; Nasopharyngeal(NP) swabs in vial transport medium  Result Value Ref Range Status   SARS Coronavirus 2 by RT PCR NEGATIVE NEGATIVE Final    Comment: (NOTE) SARS-CoV-2 target nucleic acids are NOT DETECTED.  The SARS-CoV-2 RNA is generally detectable in upper respiratory specimens during the acute phase of infection. The lowest concentration of SARS-CoV-2 viral copies this assay can detect is 138 copies/mL. A negative result does not preclude SARS-Cov-2 infection and should not be used as the sole basis for treatment or other patient management decisions. A negative result may occur with  improper specimen collection/handling, submission of specimen other than nasopharyngeal swab, presence of viral mutation(s) within the areas targeted by this assay, and inadequate number of viral copies(<138 copies/mL). A negative result must be combined with clinical observations, patient history, and epidemiological information. The expected result is Negative.  Fact Sheet for Patients:  EntrepreneurPulse.com.au  Fact Sheet for Healthcare Providers:  IncredibleEmployment.be  This test is no t yet approved or cleared by the [place] FDA and  has been authorized for detection and/or diagnosis of SARS-CoV-2 by FDA under an Emergency Use Authorization (EUA). This EUA will remain  in effect (meaning this test can be used) for the duration of the COVID-19 declaration under Section  564(b)(1) of the Act, 21 U.S.C.section 360bbb-3(b)(1), unless the authorization is terminated  or revoked sooner.       Influenza A by PCR NEGATIVE NEGATIVE Final   Influenza B by PCR NEGATIVE NEGATIVE Final    Comment: (NOTE) The Xpert Xpress SARS-CoV-2/FLU/RSV plus assay is intended as an aid in the diagnosis of influenza from Nasopharyngeal swab specimens and should not be used as a sole basis for treatment. Nasal washings and aspirates are unacceptable for Xpert Xpress SARS-CoV-2/FLU/RSV testing.  Fact Sheet for Patients: EntrepreneurPulse.com.au  Fact Sheet for Healthcare Providers: IncredibleEmployment.be  This test is not yet approved or cleared by the [place] FDA and has been authorized for detection and/or diagnosis of SARS-CoV-2 by FDA under an Emergency Use Authorization (EUA). This EUA will remain in effect (meaning this test can be used) for the duration of the COVID-19 declaration under Section 564(b)(1) of the Act, 21 U.S.C. section 360bbb-3(b)(1), unless the authorization is terminated or revoked.  Performed at [facility], [address], [place], [place] [address]   Blood Culture (routine x 2)     Status: None (Preliminary result)   Collection Time: 09/02/20  1:16 PM   Specimen: BLOOD RIGHT WRIST  Result Value Ref Range Status   Specimen Description   Final    BLOOD RIGHT WRIST BOTTLES DRAWN AEROBIC AND ANAEROBIC   Special Requests  Blood Culture adequate volume  Final   Culture   Final    NO GROWTH 4 DAYS Performed at [facility], [address], [place], [place] [address]    Report Status PENDING  Incomplete  Blood Culture (routine x 2)     Status: None (Preliminary result)   Collection Time: 09/02/20  1:16 PM   Specimen: Right Antecubital; Blood  Result Value Ref Range Status   Specimen Description   Final    RIGHT ANTECUBITAL BOTTLES DRAWN AEROBIC AND ANAEROBIC   Special Requests Blood Culture adequate  volume  Final   Culture   Final    NO GROWTH 4 DAYS Performed at [facility], [address] [place], [place] [address]    Report Status PENDING  Incomplete  Urine culture     Status: None   Collection Time: 09/02/20  2:10 PM   Specimen: Urine, Catheterized  Result Value Ref Range Status   Specimen Description   Final    URINE, CATHETERIZED Performed at [facility], [address] [place], [place] [address]    Special Requests   Final    NONE Performed at [facility], [address], [place], [place] [address]    Culture   Final    NO GROWTH Performed at [facility], [place] [address] [place], [place] [address]    Report Status 09/04/2020 FINAL  Final  MRSA PCR Screening     Status: Abnormal   Collection Time: 09/03/20  3:34 AM   Specimen: Nasopharyngeal  Result Value Ref Range Status   MRSA by PCR POSITIVE (A) NEGATIVE Final    Comment:        The GeneXpert MRSA Assay (FDA approved for NASAL specimens only), is one component of a comprehensive MRSA colonization surveillance program. It is not intended to diagnose MRSA infection nor to guide or monitor treatment for MRSA infections. RESULT CALLED TO, READ BACK BY AND VERIFIED WITH: A ROGERS,RN'@0531'  09/03/20 MKELLY Performed at [facility], [address], [place], [place] [address]   Respiratory (~20 pathogens) panel by PCR     Status: None   Collection Time: 09/03/20  1:15 PM   Specimen: Nasopharyngeal Swab; Respiratory  Result Value Ref Range Status   Adenovirus NOT DETECTED NOT DETECTED Final   Coronavirus 229E NOT DETECTED NOT DETECTED Final    Comment: (NOTE) The Coronavirus on the Respiratory Panel, DOES NOT test for the novel  Coronavirus (2019 nCoV)    Coronavirus HKU1 NOT DETECTED NOT DETECTED Final   Coronavirus NL63 NOT DETECTED NOT DETECTED Final   Coronavirus OC43 NOT DETECTED NOT DETECTED Final   Metapneumovirus NOT DETECTED NOT DETECTED Final   Rhinovirus / Enterovirus NOT DETECTED NOT  DETECTED Final   Influenza A NOT DETECTED NOT DETECTED Final   Influenza B NOT DETECTED NOT DETECTED Final   Parainfluenza Virus 1 NOT DETECTED NOT DETECTED Final   Parainfluenza Virus 2 NOT DETECTED NOT DETECTED Final   Parainfluenza Virus 3 NOT DETECTED NOT DETECTED Final   Parainfluenza Virus 4 NOT DETECTED NOT DETECTED Final   Respiratory Syncytial Virus NOT DETECTED NOT DETECTED Final   Bordetella pertussis NOT DETECTED NOT DETECTED Final   Bordetella Parapertussis NOT DETECTED NOT DETECTED Final   Chlamydophila pneumoniae NOT DETECTED NOT DETECTED Final   Mycoplasma pneumoniae NOT DETECTED NOT DETECTED Final    Comment: Performed at [facility], [place] [address], [place], [place] [address]  Expectorated Sputum Assessment w Gram Stain, Rflx to Resp Cult  Status: None   Collection Time: 09/03/20  1:22 PM   Specimen: Sputum  Result Value Ref Range Status   Specimen Description SPUTUM  Final   Special Requests NONE  Final   Sputum evaluation   Final    Sputum specimen not acceptable for testing.  Please recollect.   Performed at [facility], [address] [place], [place] [address]    Report Status 09/03/2020 FINAL  Final     Labs: BNP (last 3 results) No results for input(s): BNP in the last 8760 hours. Basic Metabolic Panel: Recent Labs  Lab 09/02/20 1133 09/02/20 1653 09/03/20 0611 09/03/20 1550 09/04/20 0453 09/05/20 0437 09/06/20 0502  NA 134*  --  140  --  138 140 136  K 3.2*  --  3.4* 3.7 3.8 3.5 3.4*  CL 99  --  106  --  104 105 100  CO2 23  --  27  --  '24 26 26  ' GLUCOSE 211*  --  160*  --  224* 230* 283*  BUN 10  --  7*  --  '10 14 11  ' CREATININE 0.43*  --  0.44  --  0.38* 0.47 0.38*  CALCIUM 8.7*  --  8.1*  --  8.9 9.0 8.5*  MG  --  1.6*  --  1.8 1.8 2.0 1.9  PHOS  --   --   --   --  3.1 2.7 3.1   Liver Function Tests: Recent Labs  Lab 09/02/20 1133 09/04/20 0453 09/05/20 0437 09/06/20 0502  AST 12* 20 21 14*  ALT '9 15 21 14  ' ALKPHOS 45  48 50 42  BILITOT 1.6* 1.0 0.9 1.0  PROT 6.7 6.0* 6.0* 5.5*  ALBUMIN 3.1* 2.6* 2.6* 2.5*   Recent Labs  Lab 09/02/20 1133  LIPASE 22   No results for input(s): AMMONIA in the last 168 hours. CBC: Recent Labs  Lab 09/02/20 1133 09/03/20 0611 09/04/20 0453 09/05/20 0437 09/06/20 0502  WBC 9.1 6.2 4.3 5.3 4.3  NEUTROABS 7.3  --  3.5 4.2 3.0  HGB 11.1* 9.6* 8.7* 9.3* 8.5*  HCT 32.7* 29.8* 26.8* 28.0* 25.6*  MCV 110.1* 114.2* 112.1* 109.4* 110.8*  PLT 83* 71* 86* 122* 102*   Cardiac Enzymes: No results for input(s): CKTOTAL, CKMB, CKMBINDEX, TROPONINI in the last 168 hours. BNP: Invalid input(s): POCBNP CBG: Recent Labs  Lab 09/05/20 0746 09/05/20 1129 09/05/20 1626 09/05/20 2127 09/06/20 1114  GLUCAP 241* 308* 302* 319* 312*   D-Dimer No results for input(s): DDIMER in the last 72 hours. Hgb A1c Recent Labs    09/04/20 0453  HGBA1C 5.7*   Lipid Profile Recent Labs    09/04/20 0453  CHOL 136  HDL 51  LDLCALC 70  TRIG 74  CHOLHDL 2.7   Thyroid function studies Recent Labs    09/04/20 0929 09/05/20 1627  TSH 0.060*  --   T3FREE  --  1.6*   Anemia work up No results for input(s): VITAMINB12, FOLATE, FERRITIN, TIBC, IRON, RETICCTPCT in the last 72 hours. Urinalysis    Component Value Date/Time   COLORURINE YELLOW 09/02/2020 1410   APPEARANCEUR HAZY (A) 09/02/2020 1410   LABSPEC 1.024 09/02/2020 1410   PHURINE 5.0 09/02/2020 1410   GLUCOSEU >=500 (A) 09/02/2020 1410   GLUCOSEU NEGATIVE 02/13/2015 1455   HGBUR NEGATIVE 09/02/2020 1410   BILIRUBINUR NEGATIVE 09/02/2020 1410   KETONESUR 80 (A) 09/02/2020 1410   PROTEINUR 100 (A) 09/02/2020 1410   UROBILINOGEN 0.2 02/13/2015 1455  NITRITE NEGATIVE 09/02/2020 1410   LEUKOCYTESUR NEGATIVE 09/02/2020 1410   Sepsis Labs Invalid input(s): PROCALCITONIN,  WBC,  LACTICIDVEN Microbiology Recent Results (from the past 240 hour(s))  Resp Panel by RT-PCR (Flu A&B, Covid) Nasopharyngeal Swab     Status:  None   Collection Time: 09/02/20 10:59 AM   Specimen: Nasopharyngeal Swab; Nasopharyngeal(NP) swabs in vial transport medium  Result Value Ref Range Status   SARS Coronavirus 2 by RT PCR NEGATIVE NEGATIVE Final    Comment: (NOTE) SARS-CoV-2 target nucleic acids are NOT DETECTED.  The SARS-CoV-2 RNA is generally detectable in upper respiratory specimens during the acute phase of infection. The lowest concentration of SARS-CoV-2 viral copies this assay can detect is 138 copies/mL. A negative result does not preclude SARS-Cov-2 infection and should not be used as the sole basis for treatment or other patient management decisions. A negative result may occur with  improper specimen collection/handling, submission of specimen other than nasopharyngeal swab, presence of viral mutation(s) within the areas targeted by this assay, and inadequate number of viral copies(<138 copies/mL). A negative result must be combined with clinical observations, patient history, and epidemiological information. The expected result is Negative.  Fact Sheet for Patients:  EntrepreneurPulse.com.au  Fact Sheet for Healthcare Providers:  IncredibleEmployment.be  This test is no t yet approved or cleared by the [place] FDA and  has been authorized for detection and/or diagnosis of SARS-CoV-2 by FDA under an Emergency Use Authorization (EUA). This EUA will remain  in effect (meaning this test can be used) for the duration of the COVID-19 declaration under Section 564(b)(1) of the Act, 21 U.S.C.section 360bbb-3(b)(1), unless the authorization is terminated  or revoked sooner.       Influenza A by PCR NEGATIVE NEGATIVE Final   Influenza B by PCR NEGATIVE NEGATIVE Final    Comment: (NOTE) The Xpert Xpress SARS-CoV-2/FLU/RSV plus assay is intended as an aid in the diagnosis of influenza from Nasopharyngeal swab specimens and should not be used as a sole basis for  treatment. Nasal washings and aspirates are unacceptable for Xpert Xpress SARS-CoV-2/FLU/RSV testing.  Fact Sheet for Patients: EntrepreneurPulse.com.au  Fact Sheet for Healthcare Providers: IncredibleEmployment.be  This test is not yet approved or cleared by the [place] FDA and has been authorized for detection and/or diagnosis of SARS-CoV-2 by FDA under an Emergency Use Authorization (EUA). This EUA will remain in effect (meaning this test can be used) for the duration of the COVID-19 declaration under Section 564(b)(1) of the Act, 21 U.S.C. section 360bbb-3(b)(1), unless the authorization is terminated or revoked.  Performed at [facility], [address], [place], [place] [address]   Blood Culture (routine x 2)     Status: None (Preliminary result)   Collection Time: 09/02/20  1:16 PM   Specimen: BLOOD RIGHT WRIST  Result Value Ref Range Status   Specimen Description   Final    BLOOD RIGHT WRIST BOTTLES DRAWN AEROBIC AND ANAEROBIC   Special Requests Blood Culture adequate volume  Final   Culture   Final    NO GROWTH 4 DAYS Performed at [facility], [address] [place], [place] [address]    Report Status PENDING  Incomplete  Blood Culture (routine x 2)     Status: None (Preliminary result)   Collection Time: 09/02/20  1:16 PM   Specimen: Right Antecubital; Blood  Result Value Ref Range Status   Specimen Description   Final    RIGHT ANTECUBITAL BOTTLES DRAWN AEROBIC AND ANAEROBIC  Special Requests Blood Culture adequate volume  Final   Culture   Final    NO GROWTH 4 DAYS Performed at [facility], [address], [place], [place] [address]    Report Status PENDING  Incomplete  Urine culture     Status: None   Collection Time: 09/02/20  2:10 PM   Specimen: Urine, Catheterized  Result Value Ref Range Status   Specimen Description   Final    URINE, CATHETERIZED Performed at [facility], [address] [place], [place]  [address]    Special Requests   Final    NONE Performed at [facility], [address] [place], [place] [address]    Culture   Final    NO GROWTH Performed at [facility], [place] [address], [place], [place] [address]    Report Status 09/04/2020 FINAL  Final  MRSA PCR Screening     Status: Abnormal   Collection Time: 09/03/20  3:34 AM   Specimen: Nasopharyngeal  Result Value Ref Range Status   MRSA by PCR POSITIVE (A) NEGATIVE Final    Comment:        The GeneXpert MRSA Assay (FDA approved for NASAL specimens only), is one component of a comprehensive MRSA colonization surveillance program. It is not intended to diagnose MRSA infection nor to guide or monitor treatment for MRSA infections. RESULT CALLED TO, READ BACK BY AND VERIFIED WITH: A ROGERS,RN'@0531'  09/03/20 MKELLY Performed at [facility], [address], [place], [place] [address]   Respiratory (~20 pathogens) panel by PCR     Status: None   Collection Time: 09/03/20  1:15 PM   Specimen: Nasopharyngeal Swab; Respiratory  Result Value Ref Range Status   Adenovirus NOT DETECTED NOT DETECTED Final   Coronavirus 229E NOT DETECTED NOT DETECTED Final    Comment: (NOTE) The Coronavirus on the Respiratory Panel, DOES NOT test for the novel  Coronavirus (2019 nCoV)    Coronavirus HKU1 NOT DETECTED NOT DETECTED Final   Coronavirus NL63 NOT DETECTED NOT DETECTED Final   Coronavirus OC43 NOT DETECTED NOT DETECTED Final   Metapneumovirus NOT DETECTED NOT DETECTED Final   Rhinovirus / Enterovirus NOT DETECTED NOT DETECTED Final   Influenza A NOT DETECTED NOT DETECTED Final   Influenza B NOT DETECTED NOT DETECTED Final   Parainfluenza Virus 1 NOT DETECTED NOT DETECTED Final   Parainfluenza Virus 2 NOT DETECTED NOT DETECTED Final   Parainfluenza Virus 3 NOT DETECTED NOT DETECTED Final   Parainfluenza Virus 4 NOT DETECTED NOT DETECTED Final   Respiratory Syncytial Virus NOT DETECTED NOT DETECTED Final   Bordetella  pertussis NOT DETECTED NOT DETECTED Final   Bordetella Parapertussis NOT DETECTED NOT DETECTED Final   Chlamydophila pneumoniae NOT DETECTED NOT DETECTED Final   Mycoplasma pneumoniae NOT DETECTED NOT DETECTED Final    Comment: Performed at [facility], [place] [address] [place], [place] [address]  Expectorated Sputum Assessment w Gram Stain, Rflx to Resp Cult     Status: None   Collection Time: 09/03/20  1:22 PM   Specimen: Sputum  Result Value Ref Range Status   Specimen Description SPUTUM  Final   Special Requests NONE  Final   Sputum evaluation   Final    Sputum specimen not acceptable for testing.  Please recollect.   Performed at [facility], [address], [place], [place] [address]    Report Status 09/03/2020 FINAL  Final     Time coordinating discharge: 67mns  SIGNED:  Kathie Dike, MD  [facility] 09/06/2020, 12:19 PM   If 7PM-7AM, please contact night-coverage [email or web address]

## 2020-09-06 NOTE — Progress Notes (Signed)
Nsg Discharge Note  Admit Date:  09/02/2020 Discharge date: 09/06/2020   Hargun Spurling to be D/C'd Skilled nursing facility per MD order.  AVS completed.  Copy for chart, and copy for patient signed, and dated. Patient/caregiver able to verbalize understanding.  Discharge Medication: Allergies as of 09/06/2020      Reactions   Invokana [canagliflozin] Other (See Comments)   Yeast infectios   Losartan Other (See Comments)   Angioedema   Metformin And Related Diarrhea   Ace Inhibitors    History of angioedema with losartan, ARB   Latex Other (See Comments)   Other Reaction: redness, blisters   Other Other (See Comments)   Air fresheners - on MAR   Oxycodone-acetaminophen Itching, Other (See Comments)   TYLOX. Caused internal itching per patient    Sulfa Antibiotics Other (See Comments)   Mucinex [guaifenesin Er] Other (See Comments)   MUCINEX REACTION: asthma attacks      Medication List    STOP taking these medications   ibuprofen 800 MG tablet Commonly known as: ADVIL   metoprolol succinate 50 MG 24 hr tablet Commonly known as: TOPROL-XL     TAKE these medications   acetaminophen 500 MG tablet Commonly known as: TYLENOL Take 500 mg by mouth every 6 (six) hours as needed for mild pain, fever or headache.   albuterol 108 (90 Base) MCG/ACT inhaler Commonly known as: ProAir HFA Inhale 2 puffs every 6 hours for asthma - rescue inhaler   alum & mag hydroxide-simeth 200-200-20 MG/5ML suspension Commonly known as: MAALOX/MYLANTA Take 30 mLs by mouth as needed for indigestion or heartburn. Max 4 doses per 24 hours   amoxicillin-clavulanate 875-125 MG tablet Commonly known as: Augmentin Take 1 tablet by mouth 2 (two) times daily for 5 days.   aspirin 81 MG chewable tablet Chew 81 mg by mouth in the morning.   baclofen 10 MG tablet Commonly known as: LIORESAL Take 5 mg by mouth at bedtime.   Biofreeze 4 % Gel Generic drug: Menthol (Topical Analgesic) Apply  topically 2 (two) times daily. Apply to lower back topically 2 times a day for back pain.   Carboxymethylcellulose Sod PF 0.5 % Soln Place 1 drop into both eyes 2 (two) times daily.   diltiazem 180 MG 24 hr capsule Commonly known as: CARDIZEM CD Take 1 capsule (180 mg total) by mouth daily.   divalproex 500 MG DR tablet Commonly known as: DEPAKOTE Take 500 mg by mouth 3 (three) times daily.   docusate sodium 100 MG capsule Commonly known as: COLACE Take 100 mg by mouth in the morning.   fluticasone furoate-vilanterol 100-25 MCG/INH Aepb Commonly known as: Breo Ellipta Inhale 1 puff into the lungs daily.   gabapentin 300 MG capsule Commonly known as: NEURONTIN Take 300 mg by mouth See admin instructions. Take 300 mg by mouth in the morning and 600 mg at bedtime   Glucagon Emergency 1 MG Kit Inject as directed. Inject 1 mg intramuscularly every 24 hours as needed for low blood glucose blood glucose below 60 and unable to take oral glucose replacement.   guaiFENesin-dextromethorphan 100-10 MG/5ML syrup Commonly known as: ROBITUSSIN DM Take 5 mLs by mouth every 4 (four) hours as needed for cough.   insulin glargine 100 UNIT/ML injection Commonly known as: LANTUS Inject 15 Units into the skin at bedtime. *Hold if blood glucose is less than 160*   insulin lispro 100 UNIT/ML injection Commonly known as: HUMALOG Inject into the skin 3 (three) times daily  before meals. 0-59 call MD 60-200=0 201-250= 2 units 251-300=4 units 301-350= 6 units 351-400= 8 units 401-450= 10 units 451+ Call MD   ipratropium 0.03 % nasal spray Commonly known as: ATROVENT Place 2 sprays into both nostrils 3 (three) times daily. Before meals   ipratropium-albuterol 0.5-2.5 (3) MG/3ML Soln Commonly known as: DUONEB Take 3 mLs by nebulization. 3 ml inhale orally every 6 hours as needed for SOB/wheezing   lactulose 10 GM/15ML solution Commonly known as: CHRONULAC Take 10 g by mouth daily as needed  for mild constipation.   levothyroxine 50 MCG tablet Commonly known as: SYNTHROID Take 1 tablet (50 mcg total) by mouth daily before breakfast. What changed:   medication strength  See the new instructions.   loperamide 2 MG tablet Commonly known as: IMODIUM A-D Take 2 mg by mouth 3 (three) times daily as needed for diarrhea or loose stools.   lubiprostone 24 MCG capsule Commonly known as: AMITIZA Take 1 capsule (24 mcg total) by mouth 2 (two) times daily with a meal.   magnesium hydroxide 400 MG/5ML suspension Commonly known as: MILK OF MAGNESIA Take 30 mLs by mouth at bedtime as needed for mild constipation.   metFORMIN 500 MG tablet Commonly known as: GLUCOPHAGE Take by mouth 2 (two) times daily with a meal.   metoprolol tartrate 50 MG tablet Commonly known as: LOPRESSOR Take 1 tablet (50 mg total) by mouth 2 (two) times daily.   omega-3 acid ethyl esters 1 g capsule Commonly known as: LOVAZA Take 1 g by mouth daily.   pantoprazole 40 MG tablet Commonly known as: PROTONIX Take 40 mg by mouth daily before breakfast.   polyethylene glycol 17 g packet Commonly known as: MIRALAX / GLYCOLAX Take 17 g by mouth daily as needed for moderate constipation.   polyethylene glycol-electrolytes 420 g solution Commonly known as: NuLYTELY As directed   potassium chloride SA 20 MEQ tablet Commonly known as: KLOR-CON Take 20 mEq by mouth daily.   predniSONE 20 MG tablet Commonly known as: DELTASONE Take 2 tablets (40 mg total) by mouth daily. 3 days   primidone 50 MG tablet Commonly known as: MYSOLINE Take 50 mg by mouth in the morning.   QUEtiapine 100 MG tablet Commonly known as: SEROQUEL Take 100 mg by mouth at bedtime.   senna 8.6 MG Tabs tablet Commonly known as: SENOKOT Take 2 tablets by mouth daily as needed for mild constipation.   sertraline 50 MG tablet Commonly known as: ZOLOFT Take 50 mg by mouth daily.   sitaGLIPtin 100 MG tablet Commonly known  as: JANUVIA Take 100 mg by mouth daily.   sodium chloride 0.65 % nasal spray Commonly known as: OCEAN Place 2 sprays into the nose as needed (Rhinitis). Both nostrils   sodium chloride 1 g tablet Take 1 g by mouth 2 (two) times daily.   Systane 0.4-0.3 % Soln Generic drug: Polyethyl Glycol-Propyl Glycol Apply to eye as needed.   valbenazine 40 MG capsule Commonly known as: INGREZZA Take 40 mg by mouth daily.       Discharge Assessment: Vitals:   09/06/20 1258 09/06/20 1320  BP:  124/86  Pulse:  83  Resp:  18  Temp:  98.2 F (36.8 C)  SpO2: 96% 96%   Skin clean, dry and intact without evidence of skin break down, no evidence of skin tears noted. IV catheter discontinued intact. Site without signs and symptoms of complications - no redness or edema noted at insertion site, patient denies  c/o pain - only slight tenderness at site.  Dressing with slight pressure applied.  D/c Instructions-Education: Discharge instructions given to patient/family with verbalized understanding. D/c education completed with patient/family including follow up instructions, medication list, d/c activities limitations if indicated, with other d/c instructions as indicated by MD - patient able to verbalize understanding, all questions fully answered. Patient instructed to return to ED, call 911, or call MD for any changes in condition.  Patient escorted via [place], and D/C home via private auto.  Dorcas Mcmurray, LPN 11/13/226 4:06 PM

## 2020-09-06 NOTE — Plan of Care (Signed)
  Problem: Acute Rehab PT Goals(only PT should resolve) Goal: Pt Will Go Supine/Side To Sit Outcome: Progressing Flowsheets (Taken 09/06/2020 1354) Pt will go Supine/Side to Sit: with minimal assist Goal: Pt Will Go Sit To Supine/Side Outcome: Progressing Flowsheets (Taken 09/06/2020 1354) Pt will go Sit to Supine/Side: with minimal assist Goal: Patient Will Transfer Sit To/From Stand Outcome: Progressing Flowsheets (Taken 09/06/2020 1354) Patient will transfer sit to/from stand: with moderate assist Goal: Pt Will Transfer Bed To Chair/Chair To Bed Outcome: Progressing Flowsheets (Taken 09/06/2020 1354) Pt will Transfer Bed to Chair/Chair to Bed: with mod assist  1:54 PM, 09/06/20 Mearl Latin PT, DPT Physical Therapist at [facility]

## 2020-09-06 NOTE — Progress Notes (Cosign Needed)
PT eyes open, sitting in bed alert to person place and time, watching tv. PT denies pain but stated she has  a little tickle in throat.

## 2020-09-06 NOTE — Progress Notes (Signed)
Discharged to [facility] [place],report called and given to [facility] LPN. Transported by [facility] to awaiting facility. Vital signs stable.

## 2020-09-06 NOTE — Evaluation (Signed)
Physical Therapy Evaluation Patient Details Name: Nelli Swalley MRN: [id number] DOB: 1950/05/09 Today's Date: 09/06/2020   History of Present Illness  [name] is a 70 y.o. female with medical history significant for COPD, asthma with chronic respiratory failure on 2 L depression, hypertension, OSA, thrombocytopenia, aspiration pneumonia, tremor with deep brain stimulator.  Patient presented to the ED for complaints of difficulty breathing, with cough productive of yellowish sputum.  Patient is able to answer very few questions, as at the time of my evaluation, patient starts coughing significantly limiting further history.  Patient tells me she has been having difficulty swallowing liquids, for which she had a barium swallow done.  She has not had any problems with solids.  This is a chronic issue for patient.  She denies vomiting.  At the facility, patient's O2 sat had to be increased to 4 L    Clinical Impression  Patient limited for functional mobility as stated below secondary to BLE weakness, fatigue and impaired balance. Patient completes bed mobility with slow, labored movements with use of bed rails and assist to transition to seated EOB. Patient requires bilateral UE support and intermittent assist for balance secondary to weakness/tremors. Attempted to transfer to standing twice but patient unable to secondary to weakness despite assist and use of RW. Patient assisted back to bed at end of session. Patient will benefit from continued physical therapy in hospital and recommended venue below to increase strength, balance, endurance for safe ADLs and gait.     Follow Up Recommendations SNF    Equipment Recommendations  None recommended by PT    Recommendations for Other Services       Precautions / Restrictions Precautions Precautions: Fall Restrictions Weight Bearing Restrictions: No      Mobility  Bed Mobility Overal bed mobility: Needs Assistance Bed  Mobility: Supine to Sit;Sit to Supine     Supine to sit: Mod assist Sit to supine: Mod assist   General bed mobility comments: slow, labored assist to transition to seated EOB with use of bedrails    Transfers                 General transfer comment: attempted twice with RW, patient unable to trasnfer to standing with assist  Ambulation/Gait                Stairs            Wheelchair Mobility    Modified Rankin (Stroke Patients Only)       Balance Overall balance assessment: Needs assistance Sitting-balance support: Bilateral upper extremity supported;Feet supported Sitting balance-Leahy Scale: Poor Sitting balance - Comments: fair /poor                                     Pertinent Vitals/Pain Pain Assessment: No/denies pain    Home Living Family/patient expects to be discharged to:: Skilled nursing facility                      Prior Function Level of [place]: Needs assistance   Gait / Transfers Assistance Needed: Patient states she was using a rollator until she fell last week and is now using a wheelchair  ADL's / Homemaking Assistance Needed: assisted by staff at facility        Hand Dominance        Extremity/Trunk Assessment   Upper Extremity Assessment Upper Extremity  Assessment: Generalized weakness    Lower Extremity Assessment Lower Extremity Assessment: Generalized weakness    Cervical / Trunk Assessment Cervical / Trunk Assessment: Normal  Communication   Communication: Expressive difficulties  Cognition Arousal/Alertness: Awake/alert Behavior During Therapy: WFL for tasks assessed/performed Overall Cognitive Status: Within Functional Limits for tasks assessed                                        General Comments      Exercises     Assessment/Plan    PT Assessment Patient needs continued PT services  PT Problem List Decreased strength;Decreased activity  tolerance;Decreased balance;Decreased mobility       PT Treatment Interventions DME instruction;Balance training;Gait training;Neuromuscular re-education;Stair training;Patient/family education;Functional mobility training;Therapeutic activities;Therapeutic exercise    PT Goals (Current goals can be found in the Care Plan section)  Acute Rehab PT Goals Patient Stated Goal: Return home PT Goal Formulation: With patient Time For Goal Achievement: 09/13/20 Potential to Achieve Goals: Good    Frequency Min 3X/week   Barriers to discharge        Co-evaluation               AM-PAC PT "6 Clicks" Mobility  Outcome Measure Help needed turning from your back to your side while in a flat bed without using bedrails?: A Little Help needed moving from lying on your back to sitting on the side of a flat bed without using bedrails?: A Lot Help needed moving to and from a bed to a chair (including a wheelchair)?: Total Help needed standing up from a chair using your arms (e.g., wheelchair or bedside chair)?: Total Help needed to walk in hospital room?: Total Help needed climbing 3-5 steps with a railing? : Total 6 Click Score: 9    End of Session Equipment Utilized During Treatment: Gait belt Activity Tolerance: Patient tolerated treatment well Patient left: in bed;with call bell/phone within reach;with bed alarm set Nurse Communication: Mobility status PT Visit Diagnosis: Other abnormalities of gait and mobility (R26.89);Unsteadiness on feet (R26.81);History of falling (Z91.81);Muscle weakness (generalized) (M62.81)    Time: 7544-9201 PT Time Calculation (min) (ACUTE ONLY): 18 min   Charges:   PT Evaluation $PT Eval Low Complexity: 1 Low          1:51 PM, 09/06/20 Mearl Latin PT, DPT Physical Therapist at [facility]

## 2020-09-06 NOTE — TOC Transition Note (Signed)
Transition of Care [facility]) - CM/SW Discharge Note   Patient Details  Name: [name] MRN: [id number] Date of Birth: 03-21-1951  Transition of Care [facility] CM/SW Contact:  Iona Beard, [place] Phone Number: 09/06/2020, 2:29 PM   Clinical Narrative:    CSW spoke with Janett Billow at [facility] who states that pt is a long term resident and can return when medically cleared. CSW completed updated Fl2 for facility. CSW sent Fl2 and discharge summary in HUB to facility. Facility requiring COVID test completed before pt can return. Results are pending. CSW completed the med necessity form and will print it to the floor. Pt will need EMS transport once COVID has been completed. CSW updated pts daughter [name] that pt will be returning to [facility] today, she is understanding. TOC signing off.    Final next level of care: Long Term Nursing Home Barriers to Discharge: Barriers Resolved   Patient Goals and CMS Choice Patient states their goals for this hospitalization and ongoing recovery are:: Go back to [facility] CMS Medicare.gov Compare Post Acute Care list provided to:: Patient Choice offered to / list presented to : Patient  Discharge Placement              Patient chooses bed at: Other - please specify in the comment section below: ([facility] [place]) Patient to be transferred to facility by: [place] Name of family member notified: [name] (daughter) Patient and family notified of of transfer: 09/06/20  Discharge Plan and Services                DME Arranged: N/A DME Agency: NA       HH Arranged: NA [place] Agency: NA        Social Determinants of Health (SDOH) Interventions     Readmission Risk Interventions Readmission Risk Prevention Plan 09/13/2019 09/11/2019  Transportation Screening - Complete  PCP or Specialist Appt within 3-5 Days Complete -  Medication Review (RN CM) - Complete  HRI or Home Care Consult Complete -  Social Work Consult for  [place] Planning/Counseling Complete -  Palliative Care Screening Not Applicable -  Medication Review (RN Care Manager) Referral to Pharmacy -  Some recent data might be hidden

## 2020-09-07 LAB — CULTURE, BLOOD (ROUTINE X 2)
Culture: NO GROWTH
Culture: NO GROWTH
Special Requests: ADEQUATE
Special Requests: ADEQUATE

## 2020-09-16 ENCOUNTER — Telehealth: Payer: Self-pay

## 2020-09-16 NOTE — Telephone Encounter (Signed)
Tried to call [facility], covid test no longer required prior to procedure. No answer. Pt has upcoming pre-op appt and will be given arrival time for procedure

## 2020-09-23 ENCOUNTER — Encounter (HOSPITAL_COMMUNITY): Payer: Self-pay | Admitting: *Deleted

## 2020-09-23 ENCOUNTER — Emergency Department (HOSPITAL_COMMUNITY)
Admission: EM | Admit: 2020-09-23 | Discharge: 2020-09-23 | Disposition: A | Discharge status: Home / Self Care | Payer: 59 | Attending: Emergency Medicine | Admitting: Emergency Medicine

## 2020-09-23 ENCOUNTER — Emergency Department (HOSPITAL_COMMUNITY): Payer: 59

## 2020-09-23 ENCOUNTER — Other Ambulatory Visit: Payer: Self-pay

## 2020-09-23 DIAGNOSIS — Z7952 Long term (current) use of systemic steroids: Secondary | ICD-10-CM | POA: Diagnosis not present

## 2020-09-23 DIAGNOSIS — J449 Chronic obstructive pulmonary disease, unspecified: Secondary | ICD-10-CM | POA: Insufficient documentation

## 2020-09-23 DIAGNOSIS — I10 Essential (primary) hypertension: Secondary | ICD-10-CM | POA: Insufficient documentation

## 2020-09-23 DIAGNOSIS — E119 Type 2 diabetes mellitus without complications: Secondary | ICD-10-CM | POA: Diagnosis not present

## 2020-09-23 DIAGNOSIS — Z7982 Long term (current) use of aspirin: Secondary | ICD-10-CM | POA: Diagnosis not present

## 2020-09-23 DIAGNOSIS — W050XXA Fall from non-moving wheelchair, initial encounter: Secondary | ICD-10-CM | POA: Diagnosis not present

## 2020-09-23 DIAGNOSIS — J45909 Unspecified asthma, uncomplicated: Secondary | ICD-10-CM | POA: Insufficient documentation

## 2020-09-23 DIAGNOSIS — S90811A Abrasion, right foot, initial encounter: Secondary | ICD-10-CM | POA: Diagnosis not present

## 2020-09-23 DIAGNOSIS — W19XXXA Unspecified fall, initial encounter: Secondary | ICD-10-CM

## 2020-09-23 DIAGNOSIS — Z87891 Personal history of nicotine dependence: Secondary | ICD-10-CM | POA: Diagnosis not present

## 2020-09-23 DIAGNOSIS — Z79899 Other long term (current) drug therapy: Secondary | ICD-10-CM | POA: Insufficient documentation

## 2020-09-23 DIAGNOSIS — Z794 Long term (current) use of insulin: Secondary | ICD-10-CM | POA: Insufficient documentation

## 2020-09-23 DIAGNOSIS — S0083XA Contusion of other part of head, initial encounter: Secondary | ICD-10-CM | POA: Diagnosis not present

## 2020-09-23 DIAGNOSIS — Z7984 Long term (current) use of oral hypoglycemic drugs: Secondary | ICD-10-CM | POA: Diagnosis not present

## 2020-09-23 DIAGNOSIS — S0990XA Unspecified injury of head, initial encounter: Secondary | ICD-10-CM

## 2020-09-23 DIAGNOSIS — E039 Hypothyroidism, unspecified: Secondary | ICD-10-CM | POA: Diagnosis not present

## 2020-09-23 DIAGNOSIS — Z91040 Latex allergy status: Secondary | ICD-10-CM | POA: Insufficient documentation

## 2020-09-23 LAB — BASIC METABOLIC PANEL
Anion gap: 11 (ref 5–15)
BUN: 11 mg/dL (ref 8–23)
CO2: 25 mmol/L (ref 22–32)
Calcium: 8.9 mg/dL (ref 8.9–10.3)
Chloride: 99 mmol/L (ref 98–111)
Creatinine, Ser: 0.55 mg/dL (ref 0.44–1.00)
GFR, Estimated: >60 mL/min (ref >60)
Glucose, Bld: 168 mg/dL — ABNORMAL HIGH (ref 70–99)
Potassium: 4 mmol/L (ref 3.5–5.1)
Sodium: 135 mmol/L (ref 135–145)

## 2020-09-23 LAB — CBC
HCT: 29.4 % — ABNORMAL LOW (ref 36.0–46.0)
Hemoglobin: 9.4 g/dL — ABNORMAL LOW (ref 12.0–15.0)
MCH: 35.9 pg — ABNORMAL HIGH (ref 26.0–34.0)
MCHC: 32 g/dL (ref 30.0–36.0)
MCV: 112.2 fL — ABNORMAL HIGH (ref 80.0–100.0)
Platelets: 98 10*3/uL — ABNORMAL LOW (ref 150–400)
RBC: 2.62 MIL/uL — ABNORMAL LOW (ref 3.87–5.11)
RDW: 14.3 % (ref 11.5–15.5)
WBC: 5 10*3/uL (ref 4.0–10.5)
nRBC: 0.4 % — ABNORMAL HIGH (ref 0.0–0.2)

## 2020-09-23 LAB — VALPROIC ACID LEVEL: Valproic Acid Lvl: 68 ug/mL (ref 50.0–100.0)

## 2020-09-23 NOTE — ED Triage Notes (Signed)
P[t is from [place] center and fell out of wheelchair, striking her head on an end table; pt has hematoma to right side of head; pt states she was trying to get up to cut ac off; pt is alert and oriented and was just discharged from hospital for pneumonia and covid

## 2020-09-23 NOTE — Discharge Instructions (Addendum)
Your CT imaging is negative for acute injury from today's fall.  Your lab work obtained also is stable.  Your Depakote level was measured today to ensure that the level was not too high in your system as this can cause significant symptoms.  It is not, the measurement today is 68 ug/mL.

## 2020-09-23 NOTE — ED Provider Notes (Signed)
Medical screening examination/treatment/procedure(s) were conducted as a shared visit with non-physician practitioner(s) and myself.  I personally evaluated the patient during the encounter.  Clinical Impression:   Final diagnoses:  Fall, initial encounter  Injury of head, initial encounter      This patient is on Depakote and aspirin, she presents to the hospital today after having a fall from her wheelchair, states she was trying to get out of the wheelchair to turn the air conditioning off in her room stumbling forward and falling onto the ground with her right forehead.  She was in her normal level of alertness when the paramedics came to see her.  They transported her to the hospital but stated that when they got here she was a little more sleepy than usual, they noticed that she might a bit hypotensive at 88/65, on my exam the patient has great pulses, normal level of alertness, is able to interact and give my answers.  She moves all 4 extremities with normal range of motion and strength, she has no facial droop and is able to speak although it is slow and a little bit labored she states that is normal for her.  She has a normal cardiac exam, she has a palpable pacemaker in her chest wall, she moves all 4 extremities without any obvious deformities though she has some scattered bruising of different ages across her arms which she states is from occasional falls and bumping into things.  CT scan of the brain, cardiac monitoring, otherwise in her usual state of health   Noemi Chapel, MD 09/24/20 [phone]

## 2020-09-23 NOTE — ED Provider Notes (Signed)
[facility] Provider Note   CSN: [id number] Arrival date & time: 09/23/20  1334     History Chief Complaint  Patient presents with  . Fall    [name] is a 70 y.o. female with a history as outlined below, most significant for history of COPD, type 2 diabetes, chronic dysphagia, hypertension, essential tremor, bipolar disorder presenting from her nursing home for evaluation of a fall.  She is predominantly wheelchair-bound but can transfer from wheelchair to bed or chair, usually with assistance per patient report, was attempting to transfer herself when she fell out of the chair.  This injury occurred just prior to arrival.  She struck the side of her head on the floor.  She denies LOC.  She called for assistance and was helped back into her chair by nursing staff.  She denies any complaints of pain from this fall except for localized pain at a site of bruising at her right forehead/temple region.  She denies nausea or vomiting, headache, vision changes, she does have some mild right sided neck soreness.  Denies any new weakness in her upper extremities but does have chronic left arm weakness.  She does have an abrasion/wound on her right posterior heel from this fall but denies significant pain at the site.  She endorses frequent falls without assistance.  Review of MAR indicates that she is on Depakote, she denies seizure disorder, this medicine is taken for psychiatric treatment.  The history is provided by the patient and the nursing home.  Fall Pertinent negatives include no chest pain, no abdominal pain, no headaches and no shortness of breath.      Past Medical History:  Diagnosis Date  . Allergic rhinitis    uses Nasonex daily as needed  . Allergy to angiotensin receptor blockers (ARB) 01/22/2014   Angioedema  . Anxiety    takes Ativan daily as needed  . Aspiration pneumonia ([place])   . Asthma    Albuterol inhaler and Neb daily as needed  . 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 [name], residual palpitations  . Bipolar 1 disorder ([place])   . Chronic back pain    reason unknown  . Constipation   . COPD (chronic obstructive pulmonary disease) ([place])   . DDD (degenerative disc disease), lumbar   . Depression    takes Zoloft daily as well as Cymbalta  . Diabetes mellitus    Type II  . Dysphagia   . Dysrhythmia    SVT ==ablation done by Dr. Rayann Heman 2007  . Esophageal reflux    takes Zantac daily  . Frequent headaches   . History of bronchitis Dec 2014  . Hypertension    takes Losartan daily  . Hypothyroidism    takes Synthroid daily  . Joint swelling    left ankle  . Low sodium syndrome   . Lumbar radiculopathy   . Seizures ([place]) 04-05-13   one time ran out of Primidone and had stopped it cold [place]  . SVT (supraventricular tachycardia) ([place])   . Tremor, essential    takes Mysoline and Neurontin daily.  . Vocal cord dysfunction   . Weakness    in left arm    Patient Active Problem List   Diagnosis Date Noted  . Pneumonia 09/02/2020  . Anemia 05/14/2020  . Macrocytic anemia 09/19/2019  . Thrombocytopenia ([place]) 09/19/2019  . Chronic back pain   . Altered mental status 09/11/2019  .  Opioid overdose ([place]) 09/11/2019  . Hypotension 09/11/2019  . Acute hypoxemic respiratory failure due to COVID-19 ([place]) 09/11/2019  . Uncomplicated asthma 00/37/0488  . Skin erythema 09/28/2018  . Umbilical pain 89/16/9450  . Constipation 03/18/2017  . CAP (community acquired pneumonia) 04/21/2016  . History of colonic polyps   . Diverticulosis of colon without hemorrhage   . Other hemorrhoids   . [name]'s ring   . Mucosal abnormality of stomach   . Dysphagia   . Encounter for screening colonoscopy 02/21/2015  . Upper airway cough syndrome 01/18/2015  . OAB (overactive bladder) 12/30/2014  . Incontinence in female 12/25/2014  . MDD (major depressive disorder), recurrent severe,  without psychosis ([place]) 12/14/2014  . Thrush of mouth and esophagus ([place]) 11/23/2014  . Depression with somatization 11/05/2014  . Type II diabetes mellitus with manifestations ([place]) 10/23/2014  . Visit for screening mammogram 08/21/2014  . Hypokalemia 05/30/2014  . Tinea corporis 05/30/2014  . Lumbar scoliosis 04/24/2014  . Obstructive sleep apnea 04/23/2014  . Moderate persistent asthma with acute bronchitis and acute exacerbation 03/02/2014  . Allergy to angiotensin receptor blockers (ARB) 01/22/2014  . S/P deep brain stimulator placement 01/10/2014  . Greater trochanteric bursitis of left hip 11/07/2013  . Spinal stenosis of lumbar region 10/30/2013  . Right thyroid nodule 10/12/2013  . CMC arthritis, thumb, degenerative 06/15/2013  . GERD (gastroesophageal reflux disease) 11/03/2012  . IBS (irritable bowel syndrome) 11/03/2012  . Esophageal stenosis 11/01/2012  . Asthma, moderate persistent, poorly-controlled 09/01/2012  . Hypothyroidism 12/22/2011  . Hyperlipidemia with target LDL less than 100 06/22/2008  . Essential hypertension 09/04/2007  . Tremor, essential 04/19/2007  . Seasonal and perennial allergic rhinitis 04/19/2007    Past Surgical History:  Procedure Laterality Date  . ABDOMINAL HYSTERECTOMY    . ABLATION FOR AVNRT    . APPENDECTOMY    . BARTHOLIN GLAND CYST EXCISION     x 2  . BIOPSY  03/04/2015   Procedure: BIOPSY (Duodenal, Gastric);  Surgeon: Daneil Dolin, MD;  Location: [facility];  Service: Endoscopy;;  . BIOPSY  12/19/2018   Procedure: BIOPSY;  Surgeon: Daneil Dolin, MD;  [facility]: [facility];  Service: Endoscopy;;  Gastric Biopsies    . BREAST SURGERY    . CESAREAN SECTION    . CHOLECYSTECTOMY    . COLONOSCOPY  01/16/2004   [id number] rectum/colon  . COLONOSCOPY WITH PROPOFOL N/A 03/04/2015   Dr.[name]- internal hemorrhoids, pancolonic diverticulosis, colonic polyp= tubular adenoma  . DEEP BRAIN STIMULATOR PLACEMENT    . DENTAL SURGERY     . ESOPHAGEAL DILATION N/A 03/04/2015   Procedure: ESOPHAGEAL DILATION WITH 54FR MALONEY DILATOR;  Surgeon: Daneil Dolin, MD;  Location: [facility];  Service: Endoscopy;  Laterality: N/A;  . ESOPHAGEAL DILATION  12/19/2018   Procedure: ESOPHAGEAL DILATION;  Surgeon: Daneil Dolin, MD;  [facility];  Service: Endoscopy;;  . ESOPHAGOGASTRODUODENOSCOPY (EGD) WITH ESOPHAGEAL DILATION  04/03/2002   KJZ:PHXTAVWP'V ring, otherwise normal esophagus, status post dilation with 56 F/Normal stomach  . ESOPHAGOGASTRODUODENOSCOPY (EGD) WITH ESOPHAGEAL DILATION N/A 11/08/2012   Dr. [name]:schatzki's ring s/p dilation/hiatal hernia  . ESOPHAGOGASTRODUODENOSCOPY (EGD) WITH PROPOFOL N/A 03/04/2015   Dr.[name]- schatzki's ring, hiatal hernia, scattered erosions bx= chronic inactive gastritis  . ESOPHAGOGASTRODUODENOSCOPY (EGD) WITH PROPOFOL N/A 05/06/2017   Normal esophagus. Dilated. Small hiatal hernia. Normal duodenal bulb and second portion of duodenum.  . ESOPHAGOGASTRODUODENOSCOPY (EGD) WITH PROPOFOL N/A 12/19/2018   [name]: Mild [name] ring status post dilation, multiple erosions  in the stomach, biopsies negative.  Marland Kitchen FINGER SURGERY    . HEMORRHOID BANDING  2017   Dr.[name]  . LEFT ANKLE LIGAMENT REPAIR     x 2  . LEFT ELBOW REPAIR    . MALONEY DILATION N/A 05/06/2017   Procedure: Venia Minks DILATION;  Surgeon: Daneil Dolin, MD;  [facility];  Service: Endoscopy;  Laterality: N/A;  . Venia Minks DILATION N/A 12/19/2018   Procedure: Venia Minks DILATION;  Surgeon: Daneil Dolin, MD;  [facility];  Service: Endoscopy;  Laterality: N/A;  . MAXIMUM ACCESS (MAS)POSTERIOR LUMBAR INTERBODY FUSION (PLIF) 1 LEVEL N/A 04/24/2014   Procedure: Lumbar four-five Maximum access Surgery posterior lumbar interbody fusion;  Surgeon: [name], MD;  Location: [facility];  Service: Neurosurgery;  Laterality: N/A;  Lumbar four-five Maximum access Surgery posterior lumbar interbody fusion  . MULTIPLE  EXTRACTIONS WITH ALVEOLOPLASTY N/A 06/18/2017   Procedure: MULTIPLE EXTRACTION;  Surgeon: Diona Browner, DDS;  Location: [facility];  Service: Oral Surgery;  Laterality: N/A;  . PARTIAL HYSTERECTOMY    . POLYPECTOMY  03/04/2015   Procedure: POLYPECTOMY (descending colon);  Surgeon: Daneil Dolin, MD;  Location: [facility];  Service: Endoscopy;;  . PULSE GENERATOR IMPLANT Bilateral 12/15/2013   Procedure: BILATERAL PULSE GENERATOR IMPLANT;  Surgeon: [name], MD;  Location: [facility];  Service: Neurosurgery;  Laterality: Bilateral;  BILATERAL  . RIGHT BREAST CYST     benign  . SUBTHALAMIC STIMULATOR BATTERY REPLACEMENT Bilateral 06/24/2018   Procedure: Bilateral implantable pulse generator battery change;  Surgeon: [name], MD;  Location: [facility];  Service: Neurosurgery;  Laterality: Bilateral;  bilateral  . SUBTHALAMIC STIMULATOR INSERTION Bilateral 12/04/2013   Procedure: Bilateral Deep brain stimulator placement;  Surgeon: [name], MD;  Location: [facility];  Service: Neurosurgery;  Laterality: Bilateral;  Bilateral Deep brain stimulator placement  . TONSILLECTOMY       OB History     Gravida  2   Para  2   Term  2   Preterm      AB      Living  1      SAB      IAB      Ectopic      Multiple      Live Births              Family History  Problem Relation Age of Onset  . Lung cancer Father        DIED AGE 46 LUNG CA  . Alcohol abuse Father   . Anxiety disorder Father   . Depression Father   . COPD Mother        DIED AGE 1,MRSA,COPD,PNEUMONIA  . Pneumonia Mother        DIED AGE 1,MRSA,COPD,PNEUMONIA  . Supraventricular tachycardia Mother   . Anxiety disorder Mother   . Depression Mother   . Alcohol abuse Mother   . Brain cancer Son        Died at age 6  . Heart disease Sister        DIED AGE 69, SMOKER,?HEART  . COPD Brother        AGE 66  . Colon cancer Neg Hx     Social History   Tobacco Use  . Smoking status: Former    Packs/day:  0.30    Years: 10.00    Pack years: 3.00    Types: Cigarettes    Quit date: 04/23/1993    Years since  quitting: 27.4  . Smokeless tobacco: Never  . Tobacco comments:    quit smoking 25+yrs ago  Vaping Use  . Vaping Use: Never used  Substance Use Topics  . Alcohol use: No    Alcohol/week: 0.0 standard drinks  . Drug use: No    Home Medications Prior to Admission medications   Medication Sig Start Date End Date Taking? Authorizing Provider  acetaminophen (TYLENOL) 500 MG tablet Take 500 mg by mouth every 6 (six) hours as needed for mild pain, fever or headache.     [provider]  albuterol (PROAIR HFA) 108 (90 Base) MCG/ACT inhaler Inhale 2 puffs every 6 hours for asthma - rescue inhaler 05/28/17   Baird Lyons D, MD  alum & mag hydroxide-simeth (MAALOX/MYLANTA) 200-200-20 MG/5ML suspension Take 30 mLs by mouth as needed for indigestion or heartburn. Max 4 doses per 24 hours    [provider]  aspirin 81 MG chewable tablet Chew 81 mg by mouth in the morning.     [provider]  baclofen (LIORESAL) 10 MG tablet Take 5 mg by mouth at bedtime.    [provider]  Carboxymethylcellulose Sod PF 0.5 % SOLN Place 1 drop into both eyes 2 (two) times daily.     [provider]  diltiazem (CARDIZEM CD) 180 MG 24 hr capsule Take 1 capsule (180 mg total) by mouth daily. 09/06/20   Kathie Dike, MD  divalproex (DEPAKOTE) 500 MG DR tablet Take 500 mg by mouth 3 (three) times daily.     [provider]  docusate sodium (COLACE) 100 MG capsule Take 100 mg by mouth in the morning.     [provider]  fluticasone furoate-vilanterol (BREO ELLIPTA) 100-25 MCG/INH AEPB Inhale 1 puff into the lungs daily. 12/17/16   Baird Lyons D, MD  gabapentin (NEURONTIN) 300 MG capsule Take 300 mg by mouth See admin instructions. Take 300 mg by mouth in the morning and 600 mg at bedtime    [provider]  Glucagon, rDNA, (GLUCAGON EMERGENCY) 1  MG KIT Inject as directed. Inject 1 mg intramuscularly every 24 hours as needed for low blood glucose blood glucose below 60 and unable to take oral glucose replacement.    [provider]  guaiFENesin-dextromethorphan (ROBITUSSIN DM) 100-10 MG/5ML syrup Take 5 mLs by mouth every 4 (four) hours as needed for cough.    [provider]  insulin glargine (LANTUS) 100 UNIT/ML injection Inject 15 Units into the skin at bedtime. *Hold if blood glucose is less than 160*    [provider]  insulin lispro (HUMALOG) 100 UNIT/ML injection Inject into the skin 3 (three) times daily before meals. 0-59 call MD 60-200=0 201-250= 2 units 251-300=4 units 301-350= 6 units 351-400= 8 units 401-450= 10 units 451+ Call MD    [provider]  ipratropium (ATROVENT) 0.03 % nasal spray Place 2 sprays into both nostrils 3 (three) times daily. Before meals    [provider]  ipratropium-albuterol (DUONEB) 0.5-2.5 (3) MG/3ML SOLN Take 3 mLs by nebulization. 3 ml inhale orally every 6 hours as needed for SOB/wheezing    [provider]  lactulose (CHRONULAC) 10 GM/15ML solution Take 10 g by mouth daily as needed for mild constipation.    [provider]  levothyroxine (SYNTHROID) 50 MCG tablet Take 1 tablet (50 mcg total) by mouth daily before breakfast. 09/06/20   Kathie Dike, MD  loperamide (IMODIUM A-D) 2 MG tablet Take 2 mg by mouth 3 (three)  times daily as needed for diarrhea or loose stools.     [provider]  lubiprostone (AMITIZA) 24 MCG capsule Take 1 capsule (24 mcg total) by mouth 2 (two) times daily with a meal. 05/14/20   Mahala Menghini, PA-C  magnesium hydroxide (MILK OF MAGNESIA) 400 MG/5ML suspension Take 30 mLs by mouth at bedtime as needed for mild constipation.     [provider]  Menthol, Topical Analgesic, (BIOFREEZE) 4 % GEL Apply topically 2 (two) times daily. Apply to lower back topically 2 times a day for back pain.     [provider]  metFORMIN (GLUCOPHAGE) 500 MG tablet Take by mouth 2 (two) times daily with a meal.    [provider]  metoprolol tartrate (LOPRESSOR) 50 MG tablet Take 1 tablet (50 mg total) by mouth 2 (two) times daily. 09/06/20   Kathie Dike, MD  omega-3 acid ethyl esters (LOVAZA) 1 g capsule Take 1 g by mouth daily.    [provider]  pantoprazole (PROTONIX) 40 MG tablet Take 40 mg by mouth daily before breakfast.     [provider]  Polyethyl Glycol-Propyl Glycol (SYSTANE) 0.4-0.3 % SOLN Apply to eye as needed.    [provider]  polyethylene glycol (MIRALAX / GLYCOLAX) packet Take 17 g by mouth daily as needed for moderate constipation. 10/01/17   Orpah Greek, MD  polyethylene glycol-electrolytes (NULYTELY) 420 g solution As directed 07/22/20   [name], Cristopher Estimable, MD  potassium chloride SA (K-DUR,KLOR-CON) 20 MEQ tablet Take 20 mEq by mouth daily.    [provider]  predniSONE (DELTASONE) 20 MG tablet Take 2 tablets (40 mg total) by mouth daily. 3 days 09/06/20   Kathie Dike, MD  primidone (MYSOLINE) 50 MG tablet Take 50 mg by mouth in the morning.     [provider]  QUEtiapine (SEROQUEL) 100 MG tablet Take 100 mg by mouth at bedtime.    [provider]  senna (SENOKOT) 8.6 MG TABS tablet Take 2 tablets by mouth daily as needed for mild constipation.    [provider]  sertraline (ZOLOFT) 50 MG tablet Take 50 mg by mouth daily.    [provider]  sitaGLIPtin (JANUVIA) 100 MG tablet Take 100 mg by mouth daily.    [provider]  sodium chloride (OCEAN) 0.65 % nasal spray Place 2 sprays into the nose as needed (Rhinitis). Both nostrils    [provider]  sodium chloride 1 g tablet Take 1 g by mouth 2 (two) times daily.    [provider]  Valbenazine Tosylate 40 MG CAPS Take 40 mg by mouth daily.    [provider]    Allergies    Invokana  [canagliflozin], Losartan, Metformin and related, Ace inhibitors, Latex, Other, Oxycodone-acetaminophen, Sulfa antibiotics, and Mucinex [guaifenesin er]  Review of Systems   Review of Systems  Constitutional:  Negative for chills and fever.  HENT:  Negative for congestion and sore throat.   Eyes: Negative.   Respiratory:  Negative for chest tightness and shortness of breath.   Cardiovascular:  Negative for chest pain.  Gastrointestinal:  Negative for abdominal pain, nausea and vomiting.  Genitourinary: Negative.   Musculoskeletal:  Positive for neck pain. Negative for arthralgias and joint swelling.  Skin: Negative.  Negative for rash and wound.  Neurological:  Positive for weakness. Negative for dizziness, light-headedness, numbness and headaches.       Chronic weakness per hpi.   Psychiatric/Behavioral: Negative.  Physical Exam Updated Vital Signs BP 98/60   Pulse 89   Temp 98.4 F (36.9 C) (Oral)   Resp 16   Ht _0  (1.626 m)   Wt 83.7 kg   SpO2 95%   BMI 31.67 kg/m   Physical Exam Vitals and nursing note reviewed.  Constitutional:      Appearance: She is well-developed.  HENT:     Head: Normocephalic.     Comments: 3 cm bruising noted right lateral forehead.  No appreciable hematoma.    Right Ear: Tympanic membrane normal.     Left Ear: Tympanic membrane normal.  Eyes:     Conjunctiva/sclera: Conjunctivae normal.  Cardiovascular:     Rate and Rhythm: Normal rate and regular rhythm.     Heart sounds: Normal heart sounds.  Pulmonary:     Effort: Pulmonary effort is normal.     Breath sounds: Normal breath sounds. No wheezing.  Abdominal:     General: Bowel sounds are normal.     Palpations: Abdomen is soft.     Tenderness: There is no abdominal tenderness.  Musculoskeletal:        General: Normal range of motion.     Cervical back: Normal range of motion.  Skin:    General: Skin is warm and dry.     Comments: Bleeding abrasion right heel.     Neurological:     General: No focal deficit present.     Mental Status: She is alert and oriented to person, place, and time.     Comments: Moves all extremities.  Her speech is difficult to understand given history of vocal cord dysfunction.  A&O x3.  Psychiatric:        Mood and Affect: Mood normal.        Thought Content: Thought content normal.    ED Results / Procedures / Treatments   Labs (all labs ordered are listed, but only abnormal results are displayed) Labs Reviewed  CBC - Abnormal; Notable for the following components:      Result Value   RBC 2.62 (*)    Hemoglobin 9.4 (*)    HCT 29.4 (*)    MCV 112.2 (*)    MCH 35.9 (*)    Platelets 98 (*)    nRBC 0.4 (*)    All other components within normal limits  BASIC METABOLIC PANEL - Abnormal; Notable for the following components:   Glucose, Bld 168 (*)    All other components within normal limits  VALPROIC ACID LEVEL    EKG None  Radiology CT Head Wo Contrast  Result Date: 09/23/2020 CLINICAL DATA:  Pt is from [facility] center and fell out of wheelchair, striking her head on an end table; pt has hematoma to right side of headDM, hx of seizures EXAM: CT HEAD WITHOUT CONTRAST CT CERVICAL SPINE WITHOUT CONTRAST TECHNIQUE: Multidetector CT imaging of the head and cervical spine was performed following the standard protocol without intravenous contrast. Multiplanar CT image reconstructions of the cervical spine were also generated. COMPARISON:  09/10/2019. FINDINGS: CT HEAD FINDINGS Brain: No evidence of acute infarction, hemorrhage, hydrocephalus, extra-axial collection or mass lesion/mass effect. Stable deep brain stimulators extend from the superior frontal bones to the thalami. Vascular: No hyperdense vessel or unexpected calcification. Skull: Normal. Negative for fracture or focal lesion. Sinuses/Orbits: Globes and orbits are unremarkable. Sinuses are clear. Other: Small right frontal scalp contusion. CT CERVICAL SPINE FINDINGS  Alignment: Slight reversal the normal cervical lordosis, apex at C5-C6. Mild degenerative  anterolisthesis of C3 and C4. Skull base and vertebrae: No acute fracture. No primary bone lesion or focal pathologic process. Soft tissues and spinal canal: No prevertebral fluid or swelling. No visible canal hematoma. Disc levels: Moderate loss of disc height at C4-C5, C5-C6, C6-C7 and C7-T1. Endplate spurring and mild disc bulging noted at these levels. There are facet degenerative changes greatest on the right at C3-C4. No convincing disc herniation. Upper chest: No acute findings. Other: None. IMPRESSION: HEAD CT 1. No acute intracranial abnormalities.  No skull fracture. 2. Small right frontal scalp contusion. CERVICAL CT 1. No fracture or acute finding. Electronically Signed   By: Lajean Manes M.D.   On: 09/23/2020 15:01   CT Cervical Spine Wo Contrast  Result Date: 09/23/2020 CLINICAL DATA:  Pt is from [facility] center and fell out of wheelchair, striking her head on an end table; pt has hematoma to right side of headDM, hx of seizures EXAM: CT HEAD WITHOUT CONTRAST CT CERVICAL SPINE WITHOUT CONTRAST TECHNIQUE: Multidetector CT imaging of the head and cervical spine was performed following the standard protocol without intravenous contrast. Multiplanar CT image reconstructions of the cervical spine were also generated. COMPARISON:  09/10/2019. FINDINGS: CT HEAD FINDINGS Brain: No evidence of acute infarction, hemorrhage, hydrocephalus, extra-axial collection or mass lesion/mass effect. Stable deep brain stimulators extend from the superior frontal bones to the thalami. Vascular: No hyperdense vessel or unexpected calcification. Skull: Normal. Negative for fracture or focal lesion. Sinuses/Orbits: Globes and orbits are unremarkable. Sinuses are clear. Other: Small right frontal scalp contusion. CT CERVICAL SPINE FINDINGS Alignment: Slight reversal the normal cervical lordosis, apex at C5-C6. Mild degenerative  anterolisthesis of C3 and C4. Skull base and vertebrae: No acute fracture. No primary bone lesion or focal pathologic process. Soft tissues and spinal canal: No prevertebral fluid or swelling. No visible canal hematoma. Disc levels: Moderate loss of disc height at C4-C5, C5-C6, C6-C7 and C7-T1. Endplate spurring and mild disc bulging noted at these levels. There are facet degenerative changes greatest on the right at C3-C4. No convincing disc herniation. Upper chest: No acute findings. Other: None. IMPRESSION: HEAD CT 1. No acute intracranial abnormalities.  No skull fracture. 2. Small right frontal scalp contusion. CERVICAL CT 1. No fracture or acute finding. Electronically Signed   By: Lajean Manes M.D.   On: 09/23/2020 15:01    Procedures Procedures   Medications Ordered in ED Medications - No data to display  ED Course  I have reviewed the triage vital signs and the nursing notes.  Pertinent labs & imaging results that were available during my care of the patient were reviewed by me and considered in my medical decision making (see chart for details).    MDM Rules/Calculators/A&P                          Labs and imaging reviewed and discussed with patient.  Everything is stable today.  She was given reassurance.  Plan follow-up with her PCP as needed.  Her hemoglobin is low at 9.4 however this is a stable finding.  Her Depakote level is within normal range and should not be causing adverse side effects. Final Clinical Impression(s) / ED Diagnoses Final diagnoses:  Fall, initial encounter  Injury of head, initial encounter    Rx / DC Orders ED Discharge Orders     None        Landis Martins 09/23/20 1631    Noemi Chapel, MD 09/24/20  [place]

## 2020-09-23 NOTE — ED Notes (Signed)
Put pt on cardiac monitor 

## 2020-09-26 NOTE — Patient Instructions (Signed)
[name]  09/26/2020     @PREFPERIOPPHARMACY @   Your procedure is scheduled on  10/03/2020.   Report to [facility] at  [place].M.   Call this number if you have problems the morning of surgery:  [phone]   Remember:  Follow the diet and pre instructions given to you by the office.  The day of your prep, decrease Metformin to 50 mg twice a day and juauvia 50 mg that day.  Take 7.0 units of your lantus that night but hold the lantus if your glucose is 160 or below.  DO NOT take any medications for diabetes the morning of your procedure.      Take these medicines the morning of surgery with A SIP OF WATER       diltiazem, depakote, gabapentin, levothyroxine, metoprolol, protonox, primidone, zoloft, valbenazine.   Use your nebulizer and your inhalers before your come and bring your rescue inhaler with you.     Please brush your teeth.  Do not wear jewelry, make-up or nail polish.  Do not wear lotions, powders, or perfumes, or deodorant.  Do not shave 48 hours prior to surgery.  Men may shave face and neck.  Do not bring valuables to the hospital.  [facility] is not responsible for any belongings or valuables.  Contacts, dentures or bridgework may not be worn into surgery.  Leave your suitcase in the car.  After surgery it may be brought to your room.  For patients admitted to the hospital, discharge time will be determined by your treatment team.  Patients discharged the day of surgery will not be allowed to drive home and must have someone with them for 24 hours.    Special instructions:   DO NOT smoke tobacco or vape for 24 hours before your procedure.  Please read over the following fact sheets that you were given. Anesthesia Post-op Instructions and Care and Recovery After Surgery      Colonoscopy, Adult, Care After This sheet gives you information about how to care for yourself after your procedure. Your health care provider may also  give you more specific instructions. If you have problems or questions, contact your health careprovider. What can I expect after the procedure? After the procedure, it is common to have: A small amount of blood in your stool for 24 hours after the procedure. Some gas. Mild cramping or bloating of your abdomen. Follow these instructions at home: Eating and drinking  Drink enough fluid to keep your urine pale yellow. Follow instructions from your health care provider about eating or drinking restrictions. Resume your normal diet as instructed by your health care provider. Avoid heavy or fried foods that are hard to digest.  Activity Rest as told by your health care provider. Avoid sitting for a long time without moving. Get up to take short walks every 1-2 hours. This is important to improve blood flow and breathing. Ask for help if you feel weak or unsteady. Return to your normal activities as told by your health care provider. Ask your health care provider what activities are safe for you. Managing cramping and bloating  Try walking around when you have cramps or feel bloated. Apply heat to your abdomen as told by your health care provider. Use the heat source that your health care provider recommends, such as a moist heat pack or a heating pad. Place a towel between your skin and the heat source. Leave the heat  on for 20-30 minutes. Remove the heat if your skin turns bright red. This is especially important if you are unable to feel pain, heat, or cold. You may have a greater risk of getting burned.  General instructions If you were given a sedative during the procedure, it can affect you for several hours. Do not drive or operate machinery until your health care provider says that it is safe. For the first 24 hours after the procedure: Do not sign important documents. Do not drink alcohol. Do your regular daily activities at a slower pace than normal. Eat soft foods that are easy to  digest. Take over-the-counter and prescription medicines only as told by your health care provider. Keep all follow-up visits as told by your health care provider. This is important. Contact a health care provider if: You have blood in your stool 2-3 days after the procedure. Get help right away if you have: More than a small spotting of blood in your stool. Large blood clots in your stool. Swelling of your abdomen. Nausea or vomiting. A fever. Increasing pain in your abdomen that is not relieved with medicine. Summary After the procedure, it is common to have a small amount of blood in your stool. You may also have mild cramping and bloating of your abdomen. If you were given a sedative during the procedure, it can affect you for several hours. Do not drive or operate machinery until your health care provider says that it is safe. Get help right away if you have a lot of blood in your stool, nausea or vomiting, a fever, or increased pain in your abdomen. This information is not intended to replace advice given to you by your health care provider. Make sure you discuss any questions you have with your healthcare provider. Document Revised: 03/17/2019 Document Reviewed: 10/17/2018 Elsevier Patient Education  [place] After This sheet gives you information about how to care for yourself after your procedure. Your health care provider may also give you more specific instructions. If you have problems or questions, contact your health careprovider. What can I expect after the procedure? After the procedure, it is common to have: Tiredness. Forgetfulness about what happened after the procedure. Impaired judgment for important decisions. Nausea or vomiting. Some difficulty with balance. Follow these instructions at home: For the time period you were told by your health care provider:     Rest as needed. Do not participate in activities where you  could fall or become injured. Do not drive or use machinery. Do not drink alcohol. Do not take sleeping pills or medicines that cause drowsiness. Do not make important decisions or sign legal documents. Do not take care of children on your own. Eating and drinking Follow the diet that is recommended by your health care provider. Drink enough fluid to keep your urine pale yellow. If you vomit: Drink water, juice, or soup when you can drink without vomiting. Make sure you have little or no nausea before eating solid foods. General instructions Have a responsible adult stay with you for the time you are told. It is important to have someone help care for you until you are awake and alert. Take over-the-counter and prescription medicines only as told by your health care provider. If you have sleep apnea, surgery and certain medicines can increase your risk for breathing problems. Follow instructions from your health care provider about wearing your sleep device: Anytime you are sleeping, including during daytime naps.  While taking prescription pain medicines, sleeping medicines, or medicines that make you drowsy. Avoid smoking. Keep all follow-up visits as told by your health care provider. This is important. Contact a health care provider if: You keep feeling nauseous or you keep vomiting. You feel light-headed. You are still sleepy or having trouble with balance after 24 hours. You develop a rash. You have a fever. You have redness or swelling around the IV site. Get help right away if: You have trouble breathing. You have new-onset confusion at home. Summary For several hours after your procedure, you may feel tired. You may also be forgetful and have poor judgment. Have a responsible adult stay with you for the time you are told. It is important to have someone help care for you until you are awake and alert. Rest as told. Do not drive or operate machinery. Do not drink alcohol or  take sleeping pills. Get help right away if you have trouble breathing, or if you suddenly become confused. This information is not intended to replace advice given to you by your health care provider. Make sure you discuss any questions you have with your healthcare provider. Document Revised: 12/07/2019 Document Reviewed: 02/23/2019 Elsevier Patient Education  2022 [employer].

## 2020-09-30 ENCOUNTER — Other Ambulatory Visit: Payer: Self-pay

## 2020-09-30 ENCOUNTER — Telehealth: Payer: Self-pay

## 2020-09-30 NOTE — Telephone Encounter (Signed)
[place] at pre-service center called office. TCS scheduled for 10/03/20 now requires PA from [facility].   PA for TCS submitted via [facility] website. PA# [id number], valid 10/03/20-01/01/21. Informed [place] of PA.

## 2020-10-01 ENCOUNTER — Telehealth: Payer: Self-pay | Admitting: Internal Medicine

## 2020-10-01 ENCOUNTER — Encounter (HOSPITAL_COMMUNITY): Payer: Self-pay

## 2020-10-01 ENCOUNTER — Encounter (HOSPITAL_COMMUNITY)
Admission: RE | Admit: 2020-10-01 | Discharge: 2020-10-01 | Disposition: A | Discharge status: Home / Self Care | Payer: 59 | Source: Ambulatory Visit | Attending: Internal Medicine | Admitting: Internal Medicine

## 2020-10-01 NOTE — Telephone Encounter (Signed)
Spoke with Jodell Cipro at [facility]. Patient has been in/out of hospital several times and is feeling very weak. Last OV 06/2020. Does patient need OV to r/s? thanks

## 2020-10-01 NOTE — Telephone Encounter (Signed)
Jodell Cipro from the [facility] in [place] called to say that patient needed to reschedule her colonoscopy with RMR on 6/30. [phone]

## 2020-10-01 NOTE — Telephone Encounter (Signed)
Stacey please schedule thanks 

## 2020-10-03 ENCOUNTER — Ambulatory Visit (HOSPITAL_COMMUNITY): Admission: RE | Admit: 2020-10-03 | Payer: 59 | Source: Home / Self Care | Admitting: Internal Medicine

## 2020-10-03 ENCOUNTER — Encounter (HOSPITAL_COMMUNITY): Admission: RE | Payer: Self-pay | Source: Home / Self Care

## 2020-10-03 ENCOUNTER — Other Ambulatory Visit (HOSPITAL_COMMUNITY): Payer: 59

## 2020-10-03 SURGERY — COLONOSCOPY WITH PROPOFOL
Anesthesia: Monitor Anesthesia Care

## 2020-10-05 ENCOUNTER — Emergency Department (HOSPITAL_COMMUNITY): Payer: 59

## 2020-10-05 ENCOUNTER — Other Ambulatory Visit: Payer: Self-pay

## 2020-10-05 ENCOUNTER — Inpatient Hospital Stay (HOSPITAL_COMMUNITY)
Admission: EM | Admit: 2020-10-05 | Discharge: 2020-10-08 | DRG: 871 | Disposition: A | Discharge status: Skilled Nursing Facility | Payer: 59 | Attending: Family Medicine | Admitting: Family Medicine

## 2020-10-05 DIAGNOSIS — J189 Pneumonia, unspecified organism: Secondary | ICD-10-CM | POA: Diagnosis present

## 2020-10-05 DIAGNOSIS — A419 Sepsis, unspecified organism: Principal | ICD-10-CM

## 2020-10-05 DIAGNOSIS — I10 Essential (primary) hypertension: Secondary | ICD-10-CM | POA: Diagnosis present

## 2020-10-05 DIAGNOSIS — J441 Chronic obstructive pulmonary disease with (acute) exacerbation: Secondary | ICD-10-CM | POA: Diagnosis not present

## 2020-10-05 DIAGNOSIS — F419 Anxiety disorder, unspecified: Secondary | ICD-10-CM | POA: Diagnosis present

## 2020-10-05 DIAGNOSIS — R0689 Other abnormalities of breathing: Secondary | ICD-10-CM

## 2020-10-05 DIAGNOSIS — E039 Hypothyroidism, unspecified: Secondary | ICD-10-CM | POA: Diagnosis present

## 2020-10-05 DIAGNOSIS — L8990 Pressure ulcer of unspecified site, unspecified stage: Secondary | ICD-10-CM | POA: Insufficient documentation

## 2020-10-05 DIAGNOSIS — J690 Pneumonitis due to inhalation of food and vomit: Secondary | ICD-10-CM | POA: Diagnosis not present

## 2020-10-05 DIAGNOSIS — Z20822 Contact with and (suspected) exposure to covid-19: Secondary | ICD-10-CM | POA: Diagnosis present

## 2020-10-05 DIAGNOSIS — Z90711 Acquired absence of uterus with remaining cervical stump: Secondary | ICD-10-CM

## 2020-10-05 DIAGNOSIS — R6520 Severe sepsis without septic shock: Secondary | ICD-10-CM

## 2020-10-05 DIAGNOSIS — E119 Type 2 diabetes mellitus without complications: Secondary | ICD-10-CM | POA: Diagnosis present

## 2020-10-05 DIAGNOSIS — F32A Depression, unspecified: Secondary | ICD-10-CM | POA: Diagnosis present

## 2020-10-05 DIAGNOSIS — Z9981 Dependence on supplemental oxygen: Secondary | ICD-10-CM

## 2020-10-05 DIAGNOSIS — Z8249 Family history of ischemic heart disease and other diseases of the circulatory system: Secondary | ICD-10-CM

## 2020-10-05 DIAGNOSIS — B373 Candidiasis of vulva and vagina: Secondary | ICD-10-CM | POA: Diagnosis present

## 2020-10-05 DIAGNOSIS — D6959 Other secondary thrombocytopenia: Secondary | ICD-10-CM | POA: Diagnosis present

## 2020-10-05 DIAGNOSIS — J180 Bronchopneumonia, unspecified organism: Secondary | ICD-10-CM | POA: Diagnosis present

## 2020-10-05 DIAGNOSIS — G4733 Obstructive sleep apnea (adult) (pediatric): Secondary | ICD-10-CM | POA: Diagnosis present

## 2020-10-05 DIAGNOSIS — I878 Other specified disorders of veins: Secondary | ICD-10-CM

## 2020-10-05 DIAGNOSIS — Z66 Do not resuscitate: Secondary | ICD-10-CM | POA: Diagnosis present

## 2020-10-05 DIAGNOSIS — L304 Erythema intertrigo: Secondary | ICD-10-CM | POA: Diagnosis present

## 2020-10-05 DIAGNOSIS — G250 Essential tremor: Secondary | ICD-10-CM | POA: Diagnosis present

## 2020-10-05 DIAGNOSIS — B372 Candidiasis of skin and nail: Secondary | ICD-10-CM | POA: Diagnosis present

## 2020-10-05 DIAGNOSIS — F332 Major depressive disorder, recurrent severe without psychotic features: Secondary | ICD-10-CM | POA: Diagnosis present

## 2020-10-05 DIAGNOSIS — K219 Gastro-esophageal reflux disease without esophagitis: Secondary | ICD-10-CM | POA: Diagnosis present

## 2020-10-05 DIAGNOSIS — Z9689 Presence of other specified functional implants: Secondary | ICD-10-CM

## 2020-10-05 DIAGNOSIS — F319 Bipolar disorder, unspecified: Secondary | ICD-10-CM | POA: Diagnosis present

## 2020-10-05 DIAGNOSIS — E118 Type 2 diabetes mellitus with unspecified complications: Secondary | ICD-10-CM | POA: Diagnosis present

## 2020-10-05 DIAGNOSIS — D6489 Other specified anemias: Secondary | ICD-10-CM | POA: Diagnosis present

## 2020-10-05 DIAGNOSIS — J9621 Acute and chronic respiratory failure with hypoxia: Secondary | ICD-10-CM | POA: Diagnosis present

## 2020-10-05 LAB — COMPREHENSIVE METABOLIC PANEL
ALT: 8 U/L (ref 0–44)
AST: 16 U/L (ref 15–41)
Albumin: 2.8 g/dL — ABNORMAL LOW (ref 3.5–5.0)
Alkaline Phosphatase: 47 U/L (ref 38–126)
Anion gap: 8 (ref 5–15)
BUN: 10 mg/dL (ref 8–23)
CO2: 29 mmol/L (ref 22–32)
Calcium: 8.7 mg/dL — ABNORMAL LOW (ref 8.9–10.3)
Chloride: 97 mmol/L — ABNORMAL LOW (ref 98–111)
Creatinine, Ser: 0.42 mg/dL — ABNORMAL LOW (ref 0.44–1.00)
GFR, Estimated: >60 mL/min (ref >60)
Glucose, Bld: 87 mg/dL (ref 70–99)
Potassium: 3.7 mmol/L (ref 3.5–5.1)
Sodium: 134 mmol/L — ABNORMAL LOW (ref 135–145)
Total Bilirubin: 0.5 mg/dL (ref 0.3–1.2)
Total Protein: 6.5 g/dL (ref 6.5–8.1)

## 2020-10-05 LAB — BLOOD GAS, ARTERIAL
Acid-Base Excess: 5.8 mmol/L — ABNORMAL HIGH (ref 0.0–2.0)
Bicarbonate: 29.2 mmol/L — ABNORMAL HIGH (ref 20.0–28.0)
FIO2: 100
O2 Saturation: 94.5 %
Patient temperature: 37
pCO2 arterial: 47.5 mmHg (ref 32.0–48.0)
pH, Arterial: 7.419 (ref 7.350–7.450)
pO2, Arterial: 74.2 mmHg — ABNORMAL LOW (ref 83.0–108.0)

## 2020-10-05 LAB — CBC WITH DIFFERENTIAL/PLATELET
Abs Immature Granulocytes: 0.06 10*3/uL (ref 0.00–0.07)
Basophils Absolute: 0 10*3/uL (ref 0.0–0.1)
Basophils Relative: 0 %
Eosinophils Absolute: 0 10*3/uL (ref 0.0–0.5)
Eosinophils Relative: 1 %
HCT: 30.9 % — ABNORMAL LOW (ref 36.0–46.0)
Hemoglobin: 10 g/dL — ABNORMAL LOW (ref 12.0–15.0)
Immature Granulocytes: 1 %
Lymphocytes Relative: 32 %
Lymphs Abs: 1.9 10*3/uL (ref 0.7–4.0)
MCH: 36.5 pg — ABNORMAL HIGH (ref 26.0–34.0)
MCHC: 32.4 g/dL (ref 30.0–36.0)
MCV: 112.8 fL — ABNORMAL HIGH (ref 80.0–100.0)
Monocytes Absolute: 0.4 10*3/uL (ref 0.1–1.0)
Monocytes Relative: 7 %
Neutro Abs: 3.4 10*3/uL (ref 1.7–7.7)
Neutrophils Relative %: 59 %
Platelets: 133 10*3/uL — ABNORMAL LOW (ref 150–400)
RBC: 2.74 MIL/uL — ABNORMAL LOW (ref 3.87–5.11)
RDW: 15.5 % (ref 11.5–15.5)
WBC: 5.8 10*3/uL (ref 4.0–10.5)
nRBC: 0.5 % — ABNORMAL HIGH (ref 0.0–0.2)

## 2020-10-05 LAB — RAPID URINE DRUG SCREEN, HOSP PERFORMED
Amphetamines: NOT DETECTED
Barbiturates: POSITIVE — AB
Benzodiazepines: NOT DETECTED
Cocaine: NOT DETECTED
Opiates: NOT DETECTED
Tetrahydrocannabinol: NOT DETECTED

## 2020-10-05 LAB — URINALYSIS, ROUTINE W REFLEX MICROSCOPIC
Bilirubin Urine: NEGATIVE
Glucose, UA: NEGATIVE mg/dL
Ketones, ur: NEGATIVE mg/dL
Leukocytes,Ua: NEGATIVE
Nitrite: NEGATIVE
Protein, ur: NEGATIVE mg/dL
Specific Gravity, Urine: 1.01 (ref 1.005–1.030)
pH: 5.5 (ref 5.0–8.0)

## 2020-10-05 LAB — RESP PANEL BY RT-PCR (FLU A&B, COVID) ARPGX2
Influenza A by PCR: NEGATIVE
Influenza B by PCR: NEGATIVE
SARS Coronavirus 2 by RT PCR: NEGATIVE

## 2020-10-05 LAB — STREP PNEUMONIAE URINARY ANTIGEN: Strep Pneumo Urinary Antigen: NEGATIVE

## 2020-10-05 LAB — GLUCOSE, CAPILLARY
Glucose-Capillary: 132 mg/dL — ABNORMAL HIGH (ref 70–99)
Glucose-Capillary: 153 mg/dL — ABNORMAL HIGH (ref 70–99)

## 2020-10-05 LAB — LACTIC ACID, PLASMA
Lactic Acid, Venous: 1.2 mmol/L (ref 0.5–1.9)
Lactic Acid, Venous: 0.6 mmol/L (ref 0.5–1.9)

## 2020-10-05 LAB — PROTIME-INR
INR: 1.2 (ref 0.8–1.2)
Prothrombin Time: 14.7 seconds (ref 11.4–15.2)

## 2020-10-05 LAB — MRSA NEXT GEN BY PCR, NASAL: MRSA by PCR Next Gen: DETECTED — AB

## 2020-10-05 LAB — CBG MONITORING, ED: Glucose-Capillary: 83 mg/dL (ref 70–99)

## 2020-10-05 LAB — URINALYSIS, MICROSCOPIC (REFLEX)

## 2020-10-05 LAB — APTT: aPTT: 29 seconds (ref 24–36)

## 2020-10-05 MED ORDER — INSULIN ASPART 100 UNIT/ML IJ SOLN
0.0000 [IU] | Freq: Three times a day (TID) | INTRAMUSCULAR | Status: DC
  Administered 2020-10-05: 1 [IU] via SUBCUTANEOUS
  Administered 2020-10-06: 2 [IU] via SUBCUTANEOUS
  Administered 2020-10-06: 3 [IU] via SUBCUTANEOUS
  Administered 2020-10-06: 2 [IU] via SUBCUTANEOUS
  Administered 2020-10-07 (×2): 3 [IU] via SUBCUTANEOUS
  Administered 2020-10-07 – 2020-10-08 (×3): 2 [IU] via SUBCUTANEOUS

## 2020-10-05 MED ORDER — INSULIN GLARGINE 100 UNIT/ML ~~LOC~~ SOLN
15.0000 [IU] | Freq: Every day | SUBCUTANEOUS | Status: DC
  Administered 2020-10-05 – 2020-10-06 (×2): 15 [IU] via SUBCUTANEOUS
  Filled 2020-10-05 (×3): qty 0.15

## 2020-10-05 MED ORDER — DILTIAZEM HCL ER COATED BEADS 180 MG PO CP24: 180.0000 mg | ORAL_CAPSULE | Freq: Every day | ORAL | Status: DC

## 2020-10-05 MED ORDER — IOHEXOL 350 MG/ML SOLN
100.0000 mL | Freq: Once | INTRAVENOUS | Status: AC | PRN
  Administered 2020-10-05: 100 mL via INTRAVENOUS

## 2020-10-05 MED ORDER — ONDANSETRON HCL 4 MG/2ML IJ SOLN: 4.0000 mg | Freq: Four times a day (QID) | INTRAMUSCULAR | Status: DC | PRN

## 2020-10-05 MED ORDER — POLYETHYLENE GLYCOL 3350 17 G PO PACK
17.0000 g | PACK | Freq: Every day | ORAL | Status: DC
  Administered 2020-10-06 – 2020-10-07 (×2): 17 g via ORAL
  Filled 2020-10-05 (×3): qty 1

## 2020-10-05 MED ORDER — BISACODYL 10 MG RE SUPP: 10.0000 mg | Freq: Every day | RECTAL | Status: DC | PRN

## 2020-10-05 MED ORDER — SODIUM CHLORIDE 0.9 % IV SOLN: INTRAVENOUS | Status: DC

## 2020-10-05 MED ORDER — GLUCERNA 1.2 CAL PO LIQD
237.0000 mL | Freq: Two times a day (BID) | ORAL | Status: DC
  Administered 2020-10-06 – 2020-10-07 (×3): 237 mL via ORAL
  Filled 2020-10-05 (×5): qty 237

## 2020-10-05 MED ORDER — QUETIAPINE FUMARATE 100 MG PO TABS
100.0000 mg | ORAL_TABLET | Freq: Every day | ORAL | Status: DC
  Administered 2020-10-05: 100 mg via ORAL
  Filled 2020-10-05: qty 1

## 2020-10-05 MED ORDER — MUSCLE RUB 10-15 % EX CREA
1.0000 "application " | TOPICAL_CREAM | Freq: Two times a day (BID) | CUTANEOUS | Status: DC
  Administered 2020-10-06 – 2020-10-08 (×5): 1 via TOPICAL
  Filled 2020-10-05: qty 85

## 2020-10-05 MED ORDER — IPRATROPIUM-ALBUTEROL 0.5-2.5 (3) MG/3ML IN SOLN
3.0000 mL | Freq: Four times a day (QID) | RESPIRATORY_TRACT | Status: DC
  Administered 2020-10-05 – 2020-10-07 (×10): 3 mL via RESPIRATORY_TRACT
  Filled 2020-10-05 (×9): qty 3

## 2020-10-05 MED ORDER — PANTOPRAZOLE SODIUM 40 MG PO TBEC
40.0000 mg | DELAYED_RELEASE_TABLET | Freq: Every morning | ORAL | Status: DC
  Administered 2020-10-06 – 2020-10-08 (×3): 40 mg via ORAL
  Filled 2020-10-05 (×3): qty 1

## 2020-10-05 MED ORDER — SODIUM CHLORIDE 0.9% FLUSH
10.0000 mL | Freq: Two times a day (BID) | INTRAVENOUS | Status: DC
Start: 2020-10-05 — End: 2020-10-08
  Administered 2020-10-05: 30 mL
  Administered 2020-10-05 – 2020-10-07 (×5): 10 mL
  Administered 2020-10-08: 30 mL

## 2020-10-05 MED ORDER — GABAPENTIN 300 MG PO CAPS
300.0000 mg | ORAL_CAPSULE | Freq: Every morning | ORAL | Status: DC
  Administered 2020-10-06 – 2020-10-08 (×3): 300 mg via ORAL
  Filled 2020-10-05 (×3): qty 1

## 2020-10-05 MED ORDER — POTASSIUM CHLORIDE CRYS ER 20 MEQ PO TBCR
20.0000 meq | EXTENDED_RELEASE_TABLET | Freq: Every morning | ORAL | Status: DC
  Administered 2020-10-06 – 2020-10-08 (×3): 20 meq via ORAL
  Filled 2020-10-05 (×3): qty 1

## 2020-10-05 MED ORDER — SODIUM CHLORIDE 0.9 % IV SOLN: 250.0000 mL | INTRAVENOUS | Status: DC | PRN

## 2020-10-05 MED ORDER — SALINE SPRAY 0.65 % NA SOLN
2.0000 | NASAL | Status: DC | PRN
  Filled 2020-10-05: qty 44

## 2020-10-05 MED ORDER — CHLORHEXIDINE GLUCONATE CLOTH 2 % EX PADS
6.0000 | MEDICATED_PAD | Freq: Every day | CUTANEOUS | Status: DC
  Administered 2020-10-05 – 2020-10-08 (×4): 6 via TOPICAL

## 2020-10-05 MED ORDER — SODIUM CHLORIDE 0.9% FLUSH: 10.0000 mL | INTRAVENOUS | Status: DC | PRN

## 2020-10-05 MED ORDER — NOREPINEPHRINE 4 MG/250ML-% IV SOLN
0.0000 ug/min | INTRAVENOUS | Status: DC
  Filled 2020-10-05: qty 250

## 2020-10-05 MED ORDER — FLUTICASONE FUROATE-VILANTEROL 100-25 MCG/INH IN AEPB
1.0000 | INHALATION_SPRAY | Freq: Every day | RESPIRATORY_TRACT | Status: DC
  Administered 2020-10-06 – 2020-10-08 (×3): 1 via RESPIRATORY_TRACT
  Filled 2020-10-05: qty 28

## 2020-10-05 MED ORDER — GUAIFENESIN-DM 100-10 MG/5ML PO SYRP: 5.0000 mL | ORAL_SOLUTION | ORAL | Status: DC | PRN

## 2020-10-05 MED ORDER — SODIUM CHLORIDE 0.9 % IV SOLN
500.0000 mg | INTRAVENOUS | Status: DC
  Administered 2020-10-05 – 2020-10-08 (×4): 500 mg via INTRAVENOUS
  Filled 2020-10-05 (×4): qty 500

## 2020-10-05 MED ORDER — ASPIRIN 81 MG PO CHEW
81.0000 mg | CHEWABLE_TABLET | Freq: Every morning | ORAL | Status: DC
  Administered 2020-10-06 – 2020-10-08 (×3): 81 mg via ORAL
  Filled 2020-10-05 (×3): qty 1

## 2020-10-05 MED ORDER — SERTRALINE HCL 50 MG PO TABS
50.0000 mg | ORAL_TABLET | Freq: Every day | ORAL | Status: DC
  Administered 2020-10-06 – 2020-10-08 (×3): 50 mg via ORAL
  Filled 2020-10-05 (×3): qty 1

## 2020-10-05 MED ORDER — POLYVINYL ALCOHOL 1.4 % OP SOLN
1.0000 [drp] | Freq: Two times a day (BID) | OPHTHALMIC | Status: DC
  Administered 2020-10-05 – 2020-10-08 (×6): 1 [drp] via OPHTHALMIC
  Filled 2020-10-05 (×2): qty 15

## 2020-10-05 MED ORDER — LUBIPROSTONE 24 MCG PO CAPS
24.0000 ug | ORAL_CAPSULE | Freq: Two times a day (BID) | ORAL | Status: DC
  Administered 2020-10-06 – 2020-10-07 (×4): 24 ug via ORAL
  Filled 2020-10-05 (×5): qty 1

## 2020-10-05 MED ORDER — DIVALPROEX SODIUM 250 MG PO DR TAB
500.0000 mg | DELAYED_RELEASE_TABLET | Freq: Three times a day (TID) | ORAL | Status: DC
  Administered 2020-10-05 – 2020-10-08 (×8): 500 mg via ORAL
  Filled 2020-10-05 (×8): qty 2

## 2020-10-05 MED ORDER — METOPROLOL SUCCINATE ER 50 MG PO TB24: 50.0000 mg | ORAL_TABLET | Freq: Every day | ORAL | Status: DC

## 2020-10-05 MED ORDER — ONDANSETRON HCL 4 MG PO TABS: 4.0000 mg | ORAL_TABLET | Freq: Four times a day (QID) | ORAL | Status: DC | PRN

## 2020-10-05 MED ORDER — FLUCONAZOLE IN SODIUM CHLORIDE 200-0.9 MG/100ML-% IV SOLN
200.0000 mg | INTRAVENOUS | Status: DC
  Administered 2020-10-05 – 2020-10-07 (×3): 200 mg via INTRAVENOUS
  Filled 2020-10-05 (×4): qty 100

## 2020-10-05 MED ORDER — LEVOTHYROXINE SODIUM 50 MCG PO TABS
50.0000 ug | ORAL_TABLET | Freq: Every day | ORAL | Status: DC
  Administered 2020-10-06 – 2020-10-08 (×3): 50 ug via ORAL
  Filled 2020-10-05: qty 1
  Filled 2020-10-05: qty 2
  Filled 2020-10-05: qty 1

## 2020-10-05 MED ORDER — SODIUM CHLORIDE 0.9% FLUSH
3.0000 mL | Freq: Two times a day (BID) | INTRAVENOUS | Status: DC
  Administered 2020-10-05 – 2020-10-07 (×5): 3 mL via INTRAVENOUS

## 2020-10-05 MED ORDER — ACETAMINOPHEN 325 MG PO TABS: 650.0000 mg | ORAL_TABLET | Freq: Four times a day (QID) | ORAL | Status: DC | PRN

## 2020-10-05 MED ORDER — SENNOSIDES-DOCUSATE SODIUM 8.6-50 MG PO TABS
2.0000 | ORAL_TABLET | Freq: Every day | ORAL | Status: DC
  Administered 2020-10-05 – 2020-10-06 (×2): 2 via ORAL
  Filled 2020-10-05 (×3): qty 2

## 2020-10-05 MED ORDER — BACLOFEN 10 MG PO TABS
5.0000 mg | ORAL_TABLET | Freq: Every day | ORAL | Status: DC
  Administered 2020-10-05 – 2020-10-07 (×3): 5 mg via ORAL
  Filled 2020-10-05 (×3): qty 1

## 2020-10-05 MED ORDER — ALBUTEROL SULFATE (2.5 MG/3ML) 0.083% IN NEBU: 2.5000 mg | INHALATION_SOLUTION | RESPIRATORY_TRACT | Status: DC | PRN

## 2020-10-05 MED ORDER — SODIUM CHLORIDE 0.9 % IV BOLUS
1000.0000 mL | Freq: Once | INTRAVENOUS | Status: AC
  Administered 2020-10-05: 1000 mL via INTRAVENOUS

## 2020-10-05 MED ORDER — SODIUM CHLORIDE 1 G PO TABS
1.0000 g | ORAL_TABLET | Freq: Two times a day (BID) | ORAL | Status: DC
  Administered 2020-10-05 – 2020-10-08 (×6): 1 g via ORAL
  Filled 2020-10-05 (×11): qty 1

## 2020-10-05 MED ORDER — VANCOMYCIN HCL IN DEXTROSE 1-5 GM/200ML-% IV SOLN
1000.0000 mg | Freq: Two times a day (BID) | INTRAVENOUS | Status: DC
  Administered 2020-10-06 – 2020-10-08 (×5): 1000 mg via INTRAVENOUS
  Filled 2020-10-05 (×5): qty 200

## 2020-10-05 MED ORDER — POLYETHYLENE GLYCOL 3350 17 G PO PACK: 17.0000 g | PACK | Freq: Every day | ORAL | Status: DC | PRN

## 2020-10-05 MED ORDER — OMEGA-3-ACID ETHYL ESTERS 1 G PO CAPS
1.0000 g | ORAL_CAPSULE | Freq: Every morning | ORAL | Status: DC
  Administered 2020-10-06 – 2020-10-08 (×3): 1 g via ORAL
  Filled 2020-10-05 (×3): qty 1

## 2020-10-05 MED ORDER — VANCOMYCIN HCL 1500 MG/300ML IV SOLN
1500.0000 mg | Freq: Once | INTRAVENOUS | Status: AC
  Administered 2020-10-05: 1500 mg via INTRAVENOUS
  Filled 2020-10-05: qty 300

## 2020-10-05 MED ORDER — PRIMIDONE 50 MG PO TABS
50.0000 mg | ORAL_TABLET | Freq: Every morning | ORAL | Status: DC
  Administered 2020-10-06 – 2020-10-08 (×3): 50 mg via ORAL
  Filled 2020-10-05 (×5): qty 1

## 2020-10-05 MED ORDER — METHYLPREDNISOLONE SODIUM SUCC 40 MG IJ SOLR
40.0000 mg | Freq: Two times a day (BID) | INTRAMUSCULAR | Status: DC
  Administered 2020-10-05 – 2020-10-08 (×7): 40 mg via INTRAVENOUS
  Filled 2020-10-05 (×7): qty 1

## 2020-10-05 MED ORDER — SODIUM CHLORIDE 0.9% FLUSH: 3.0000 mL | INTRAVENOUS | Status: DC | PRN

## 2020-10-05 MED ORDER — POLYETHYL GLYCOL-PROPYL GLYCOL 0.4-0.3 % OP SOLN: 1.0000 [drp] | Freq: Three times a day (TID) | OPHTHALMIC | Status: DC | PRN

## 2020-10-05 MED ORDER — AZITHROMYCIN 500 MG IV SOLR: 500.0000 mg | INTRAVENOUS | Status: DC

## 2020-10-05 MED ORDER — NYSTATIN-TRIAMCINOLONE 100000-0.1 UNIT/GM-% EX OINT
TOPICAL_OINTMENT | Freq: Two times a day (BID) | CUTANEOUS | Status: DC
  Filled 2020-10-05: qty 15

## 2020-10-05 MED ORDER — VALBENAZINE TOSYLATE 40 MG PO CAPS
40.0000 mg | ORAL_CAPSULE | Freq: Every morning | ORAL | Status: DC
  Administered 2020-10-06 – 2020-10-08 (×3): 40 mg via ORAL
  Filled 2020-10-05 (×7): qty 1

## 2020-10-05 MED ORDER — INSULIN ASPART 100 UNIT/ML IJ SOLN
4.0000 [IU] | Freq: Three times a day (TID) | INTRAMUSCULAR | Status: DC
  Administered 2020-10-06 – 2020-10-07 (×6): 4 [IU] via SUBCUTANEOUS

## 2020-10-05 MED ORDER — TRAZODONE HCL 50 MG PO TABS
50.0000 mg | ORAL_TABLET | Freq: Every evening | ORAL | Status: DC | PRN
Start: 2020-10-05 — End: 2020-10-08
  Administered 2020-10-07: 50 mg via ORAL
  Filled 2020-10-05: qty 1

## 2020-10-05 MED ORDER — GABAPENTIN 300 MG PO CAPS
600.0000 mg | ORAL_CAPSULE | Freq: Every day | ORAL | Status: DC
  Administered 2020-10-05 – 2020-10-07 (×3): 600 mg via ORAL
  Filled 2020-10-05 (×3): qty 2

## 2020-10-05 MED ORDER — SODIUM CHLORIDE 0.9% FLUSH
3.0000 mL | Freq: Two times a day (BID) | INTRAVENOUS | Status: DC
  Administered 2020-10-06 – 2020-10-07 (×4): 3 mL via INTRAVENOUS

## 2020-10-05 MED ORDER — MUPIROCIN 2 % EX OINT
1.0000 "application " | TOPICAL_OINTMENT | Freq: Two times a day (BID) | CUTANEOUS | Status: DC
  Administered 2020-10-05 – 2020-10-08 (×6): 1 via NASAL
  Filled 2020-10-05 (×2): qty 22

## 2020-10-05 MED ORDER — SODIUM CHLORIDE 0.9 % IV SOLN: 1.0000 g | INTRAVENOUS | Status: DC

## 2020-10-05 MED ORDER — ENOXAPARIN SODIUM 40 MG/0.4ML IJ SOSY
40.0000 mg | PREFILLED_SYRINGE | INTRAMUSCULAR | Status: DC
  Administered 2020-10-05 – 2020-10-08 (×4): 40 mg via SUBCUTANEOUS
  Filled 2020-10-05 (×4): qty 0.4

## 2020-10-05 MED ORDER — ACETAMINOPHEN 650 MG RE SUPP: 650.0000 mg | Freq: Four times a day (QID) | RECTAL | Status: DC | PRN

## 2020-10-05 MED ORDER — INSULIN ASPART 100 UNIT/ML IJ SOLN
0.0000 [IU] | Freq: Every day | INTRAMUSCULAR | Status: DC
  Administered 2020-10-07: 2 [IU] via SUBCUTANEOUS

## 2020-10-05 MED ORDER — PIPERACILLIN-TAZOBACTAM 3.375 G IVPB 30 MIN
3.3750 g | Freq: Once | INTRAVENOUS | Status: AC
  Administered 2020-10-05: 3.375 g via INTRAVENOUS
  Filled 2020-10-05: qty 50

## 2020-10-05 MED ORDER — SODIUM CHLORIDE 0.9 % IV SOLN
2.0000 g | Freq: Three times a day (TID) | INTRAVENOUS | Status: DC
  Administered 2020-10-05 – 2020-10-08 (×10): 2 g via INTRAVENOUS
  Filled 2020-10-05 (×10): qty 2

## 2020-10-05 MED ORDER — PREDNISONE 20 MG PO TABS: 40.0000 mg | ORAL_TABLET | Freq: Every day | ORAL | Status: DC

## 2020-10-05 NOTE — ED Notes (Signed)
Hospitalist notified of decreasing bp and decreased responsiveness. Pt is responsive to painful stimuli and only minimally responsive to voice.

## 2020-10-05 NOTE — Progress Notes (Signed)
Pharmacy Antibiotic Note  Irlene Crudup a 70 y.o. female admitted on 10/05/2020 with  CAP .  Pharmacy has been consulted for cefepime dosing.  Plan: Cefepime 2gm IV every 8 hours x 5 days.  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 [name], residual palpitations   Bipolar 1 disorder ([place])    Chronic back pain    reason unknown   Constipation    COPD (chronic obstructive pulmonary disease) ([place])    DDD (degenerative disc disease), lumbar    Depression    takes Zoloft daily as well as Cymbalta   Diabetes mellitus    Type II   Dysphagia    Dysrhythmia    SVT ==ablation done by Dr. Rayann Heman 2007   Esophageal reflux    takes Zantac daily   Frequent headaches    History of bronchitis Dec 2014   Hypertension    takes Losartan daily   Hypothyroidism    takes Synthroid daily   Joint swelling    left ankle   Low sodium syndrome    Lumbar radiculopathy    Seizures ([place]) 04-05-13   one time ran out of Primidone and had stopped it cold [place]   SVT (supraventricular tachycardia) ([name])    Tremor, essential    takes Mysoline and Neurontin daily.   Vocal cord dysfunction    Weakness    in left arm    Allergies:  Allergies  Allergen Reactions   Invokana [Canagliflozin] Other (See Comments)    Yeast infectios   Losartan Other (See Comments)    Angioedema    Metformin And Related Diarrhea   Ace Inhibitors     History of angioedema with losartan, ARB   Latex Other (See Comments)    Other Reaction: redness, blisters   Other Other (See Comments)    Air fresheners - on MAR   Oxycodone-Acetaminophen Itching and Other (See  Comments)    TYLOX. Caused internal itching per patient    Sulfa Antibiotics Other (See Comments)   Mucinex [Guaifenesin Er] Other (See Comments)    MUCINEX REACTION: asthma attacks    Filed Weights   10/05/20 0340  Weight: 83.7 kg (184 lb 8.4 oz)    CBC Latest Ref Rng & Units 10/05/2020 09/23/2020 09/06/2020  WBC 4.0 - 10.5 K/uL 5.8 5.0 4.3  Hemoglobin 12.0 - 15.0 g/dL 10.0(L) 9.4(L) 8.5(L)  Hematocrit 36.0 - 46.0 % 30.9(L) 29.4(L) 25.6(L)  Platelets 150 - 400 K/uL 133(L) 98(L) 102(L)     Estimated Creatinine Clearance: 67.5 mL/min (A) (by C-G formula based on SCr of 0.42 mg/dL (L)).  Antibiotics Given (last 72 hours)     Date/Time Action Medication Dose Rate   10/05/20 0718 New Bag/Given   piperacillin-tazobactam (ZOSYN) IVPB 3.375 g 3.375 g 100 mL/hr       Antimicrobials this admission: Cefepime 10/05/2020  >>  azithromycin 10/05/2020  >>   Microbiology results: 10/05/2020  BCx: sent 10/05/2020  UCx: sent 10/05/2020  Resp Panel: negative   Thank you for allowing pharmacy to be a part of this patient's care.  Donna Christen  Sibley Rolison, PharmD Clinical Pharmacist

## 2020-10-05 NOTE — Progress Notes (Signed)
Pharmacy Antibiotic Note  [name] a 70 y.o. female admitted on 10/05/2020 with sepsis.  Pharmacy has been consulted for vancomycin dosing.  Plan: Vancomycin 1000mg  IV every 12 hours.  Goal trough 15-20 mcg/mL.  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 [name], residual palpitations   Bipolar 1 disorder ([place])    Chronic back pain    reason unknown   Constipation    COPD (chronic obstructive pulmonary disease) ([place])    DDD (degenerative disc disease), lumbar    Depression    takes Zoloft daily as well as Cymbalta   Diabetes mellitus    Type II   Dysphagia    Dysrhythmia    SVT ==ablation done by Dr. Rayann Heman 2007   Esophageal reflux    takes Zantac daily   Frequent headaches    History of bronchitis Dec 2014   Hypertension    takes Losartan daily   Hypothyroidism    takes Synthroid daily   Joint swelling    left ankle   Low sodium syndrome    Lumbar radiculopathy    Seizures ([place]) 04-05-13   one time ran out of Primidone and had stopped it cold [place]   SVT (supraventricular tachycardia) ([name])    Tremor, essential    takes Mysoline and Neurontin daily.   Vocal cord dysfunction    Weakness    in left arm    Allergies:  Allergies  Allergen Reactions   Invokana [Canagliflozin] Other (See Comments)    Yeast infectios   Losartan Other (See Comments)    Angioedema    Metformin And Related Diarrhea   Ace Inhibitors     History of angioedema with losartan, ARB   Latex Other (See Comments)    Other Reaction: redness, blisters   Other Other (See Comments)    Air fresheners - on MAR    Oxycodone-Acetaminophen Itching and Other (See Comments)    TYLOX. Caused internal itching per patient    Sulfa Antibiotics Other (See Comments)   Mucinex [Guaifenesin Er] Other (See Comments)    MUCINEX REACTION: asthma attacks    Filed Weights   10/05/20 0340  Weight: 83.7 kg (184 lb 8.4 oz)    CBC Latest Ref Rng & Units 10/05/2020 09/23/2020 09/06/2020  WBC 4.0 - 10.5 K/uL 5.8 5.0 4.3  Hemoglobin 12.0 - 15.0 g/dL 10.0(L) 9.4(L) 8.5(L)  Hematocrit 36.0 - 46.0 % 30.9(L) 29.4(L) 25.6(L)  Platelets 150 - 400 K/uL 133(L) 98(L) 102(L)     Estimated Creatinine Clearance: 67.5 mL/min (A) (by C-G formula based on SCr of 0.42 mg/dL (L)).  Antibiotics Given (last 72 hours)     Date/Time Action Medication Dose Rate   10/05/20 0718 New Bag/Given   piperacillin-tazobactam (ZOSYN) IVPB 3.375 g 3.375 g 100 mL/hr   10/05/20 0859 New Bag/Given   azithromycin (ZITHROMAX) 500 mg in sodium chloride 0.9 % 250 mL IVPB 500 mg 250 mL/hr   10/05/20 1014 New Bag/Given  [med was out of stock in pyxis. had to wait for pharmacy to restock]   ceFEPIme (MAXIPIME) 2 g in  sodium chloride 0.9 % 100 mL IVPB 2 g 200 mL/hr       Antimicrobials this admission: vancomycin 10/05/2020  >>  Cefepime 10/05/2020 >> Azithromycin 10/05/2020 >>  Microbiology results: 10/05/2020  BCx: sent 10/05/2020  UCx: sent 10/05/2020  Resp Panel: negative  10/05/2020  MRSA PCR: sent  Thank you for allowing pharmacy to be a part of this patient's care.  Thomasenia Sales, PharmD Clinical Pharmacist

## 2020-10-05 NOTE — ED Notes (Signed)
Per [name], MD, all p.o. medications to be held until weaned off of bi-pap.

## 2020-10-05 NOTE — ED Triage Notes (Signed)
[facility] from [facility] for cc of SOB. Per ems states that last week patient was dx with pneumonia. However over the last 12 hours patient has been increasingly short of breath. When ems arrived patient was 80% on 6l. Pt is 95% on NRB at this time.  At bedside patient has a DNR

## 2020-10-05 NOTE — ED Provider Notes (Signed)
[facility] EMERGENCY DEPARTMENT Provider Note   CSN: [id number] Arrival date & time: 10/05/20  0334     History Chief Complaint  Patient presents with   Shortness of Breath    [name] is a 70 y.o. female.  Patient presents to the emergency department for evaluation of hypoxia.  Patient is sent to the emergency department from skilled nursing facility.  She reportedly started having increased difficulty breathing around 12 hours ago.  She is normally on 2 L of nasal cannula oxygen, nursing home had been progressively increasing her oxygen without improvement.  EMS report that the patient was 80% when they arrived at the facility.  Patient was on 6 L at that time.  She was placed on a nonrebreather and brought to the ER for further evaluation.      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 [name], residual palpitations   Bipolar 1 disorder ([place])    Chronic back pain    reason unknown   Constipation    COPD (chronic obstructive pulmonary disease) ([place])    DDD (degenerative disc disease), lumbar    Depression    takes Zoloft daily as well as Cymbalta   Diabetes mellitus    Type II   Dysphagia    Dysrhythmia    SVT ==ablation done by Dr. Rayann Heman 2007   Esophageal reflux    takes Zantac daily   Frequent headaches    History of bronchitis Dec 2014   Hypertension    takes Losartan daily   Hypothyroidism    takes Synthroid daily   Joint swelling    left ankle   Low sodium syndrome    Lumbar radiculopathy    Seizures ([place]) 04-05-13   one time ran out of Primidone and had stopped it cold [place]   SVT (supraventricular  tachycardia) ([name])    Tremor, essential    takes Mysoline and Neurontin daily.   Vocal cord dysfunction    Weakness    in left arm    Patient Active Problem List   Diagnosis Date Noted   COPD with acute exacerbation ([place]) 10/05/2020   Aspiration pneumonitis ([place]) 10/05/2020   Acute on chronic respiratory failure with hypoxia ([place]) 10/05/2020   Severe sepsis with Hypoxia and Hypotension- due to PNA 10/05/2020   Intertrigo of genitocrural region due to Candida/candidal intertrigo/severe cutaneous candidiasis 10/05/2020   Sepsis due to PNA 10/05/2020   Poor venous access    Pneumonia 09/02/2020   Anemia 05/14/2020   Macrocytic anemia 09/19/2019   Thrombocytopenia ([place]) 09/19/2019   Chronic back pain    Altered mental status 09/11/2019   Opioid overdose ([place]) 09/11/2019   Hypotension 09/11/2019   Acute hypoxemic respiratory failure due to COVID-19 [facility]) 36/62/9476   Uncomplicated asthma 54/65/0354   Skin erythema 65/68/1275   Umbilical pain 17/00/1749   Constipation 03/18/2017   CAP (community acquired pneumonia) 04/21/2016   History of colonic polyps    Diverticulosis of colon without hemorrhage    Other hemorrhoids    [name]'s ring    Mucosal abnormality of stomach  Dysphagia    Encounter for screening colonoscopy 02/21/2015   Upper airway cough syndrome 01/18/2015   OAB (overactive bladder) 12/30/2014   Incontinence in female 12/25/2014   MDD (major depressive disorder), recurrent severe, 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/14/2014   Thrush of mouth and esophagus ([place]) 11/23/2014   Depression with somatization 11/05/2014   Type II diabetes mellitus with manifestations ([place]) 10/23/2014   Visit for screening mammogram 08/21/2014   Hypokalemia 05/30/2014   Tinea corporis 05/30/2014   Lumbar scoliosis 04/24/2014   Obstructive sleep apnea 04/23/2014   Moderate persistent asthma with acute bronchitis and acute exacerbation 03/02/2014   Allergy to angiotensin receptor blockers  (ARB) 01/22/2014   S/P deep brain stimulator placement 01/10/2014   Greater trochanteric bursitis of left hip 11/07/2013   Spinal stenosis of lumbar region 10/30/2013   Right thyroid nodule 10/12/2013   CMC arthritis, thumb, degenerative 06/15/2013   GERD (gastroesophageal reflux disease) 11/03/2012   IBS (irritable bowel syndrome) 11/03/2012   Esophageal stenosis 11/01/2012   Asthma, moderate persistent, poorly-controlled 09/01/2012   Hypothyroidism 12/22/2011   Hyperlipidemia with target LDL less than 100 06/22/2008   Essential hypertension 09/04/2007   Tremor, essential 04/19/2007   Seasonal and perennial allergic rhinitis 04/19/2007    Past Surgical History:  Procedure Laterality Date   ABDOMINAL HYSTERECTOMY     ABLATION FOR AVNRT     APPENDECTOMY     BARTHOLIN GLAND CYST EXCISION     x 2   BIOPSY  03/04/2015   Procedure: BIOPSY (Duodenal, Gastric);  Surgeon: Daneil Dolin, MD;  Location: [facility];  Service: Endoscopy;;   BIOPSY  12/19/2018   Procedure: BIOPSY;  Surgeon: Daneil Dolin, MD;  [facility];  Service: Endoscopy;;  Gastric Biopsies     BREAST SURGERY     CESAREAN SECTION     CHOLECYSTECTOMY     COLONOSCOPY  01/16/2004   [id number] rectum/colon   COLONOSCOPY WITH PROPOFOL N/A 03/04/2015   Dr.[name]- internal hemorrhoids, pancolonic diverticulosis, colonic polyp= tubular adenoma   DEEP BRAIN STIMULATOR PLACEMENT     DENTAL SURGERY     ESOPHAGEAL DILATION N/A 03/04/2015   Procedure: ESOPHAGEAL DILATION WITH 54FR MALONEY DILATOR;  Surgeon: Daneil Dolin, MD;  Location: [facility];  Service: Endoscopy;  Laterality: N/A;   ESOPHAGEAL DILATION  12/19/2018   Procedure: ESOPHAGEAL DILATION;  Surgeon: Daneil Dolin, MD;  [facility]: [facility];  Service: Endoscopy;;   ESOPHAGOGASTRODUODENOSCOPY (EGD) WITH ESOPHAGEAL DILATION  04/03/2002   FVW:[id number] ring, otherwise normal esophagus, status post dilation with 79 F/Normal stomach    ESOPHAGOGASTRODUODENOSCOPY (EGD) WITH ESOPHAGEAL DILATION N/A 11/08/2012   Dr. [name]:schatzki's ring s/p dilation/hiatal hernia   ESOPHAGOGASTRODUODENOSCOPY (EGD) WITH PROPOFOL N/A 03/04/2015   Dr.[name]- schatzki's ring, hiatal hernia, scattered erosions bx= chronic inactive gastritis   ESOPHAGOGASTRODUODENOSCOPY (EGD) WITH PROPOFOL N/A 05/06/2017   Normal esophagus. Dilated. Small hiatal hernia. Normal duodenal bulb and second portion of duodenum.   ESOPHAGOGASTRODUODENOSCOPY (EGD) WITH PROPOFOL N/A 12/19/2018   [name]: Mild [name] ring status post dilation, multiple erosions in the stomach, biopsies negative.   FINGER SURGERY     HEMORRHOID BANDING  2017   Dr.[name]   LEFT ANKLE LIGAMENT REPAIR     x 2   LEFT ELBOW REPAIR     MALONEY DILATION N/A 05/06/2017   Procedure: Venia Minks DILATION;  Surgeon: Daneil Dolin, MD;  [facility];  Service: Endoscopy;  Laterality: N/A;   MALONEY DILATION N/A 12/19/2018   Procedure: MALONEY DILATION;  Surgeon: Daneil Dolin, MD;  [facility];  Service: Endoscopy;  Laterality: N/A;   MAXIMUM ACCESS (MAS)POSTERIOR LUMBAR INTERBODY FUSION (PLIF) 1 LEVEL N/A 04/24/2014   Procedure: Lumbar four-five Maximum access Surgery posterior lumbar interbody fusion;  Surgeon: [name], MD;  Location: [place] [facility];  Service: Neurosurgery;  Laterality: N/A;  Lumbar four-five Maximum access Surgery posterior lumbar interbody fusion   MULTIPLE EXTRACTIONS WITH ALVEOLOPLASTY N/A 06/18/2017   Procedure: MULTIPLE EXTRACTION;  Surgeon: Diona Browner, DDS;  Location: [place];  Service: Oral Surgery;  Laterality: N/A;   PARTIAL HYSTERECTOMY     POLYPECTOMY  03/04/2015   Procedure: POLYPECTOMY (descending colon);  Surgeon: Daneil Dolin, MD;  Location: [facility];  Service: Endoscopy;;   PULSE GENERATOR IMPLANT Bilateral 12/15/2013   Procedure: BILATERAL PULSE GENERATOR IMPLANT;  Surgeon: [name], MD;  Location: [place] [facility];  Service: Neurosurgery;   Laterality: Bilateral;  BILATERAL   RIGHT BREAST CYST     benign   SUBTHALAMIC STIMULATOR BATTERY REPLACEMENT Bilateral 06/24/2018   Procedure: Bilateral implantable pulse generator battery change;  Surgeon: [name], MD;  Location: [place];  Service: Neurosurgery;  Laterality: Bilateral;  bilateral   SUBTHALAMIC STIMULATOR INSERTION Bilateral 12/04/2013   Procedure: Bilateral Deep brain stimulator placement;  Surgeon: [name], MD;  Location: [place] [facility];  Service: Neurosurgery;  Laterality: Bilateral;  Bilateral Deep brain stimulator placement   TONSILLECTOMY       OB History     Gravida  2   Para  2   Term  2   Preterm      AB      Living  1      SAB      IAB      Ectopic      Multiple      Live Births              Family History  Problem Relation Age of Onset   Lung cancer Father        DIED AGE 49 LUNG CA   Alcohol abuse Father    Anxiety disorder Father    Depression Father    COPD Mother        DIED AGE 41,MRSA,COPD,PNEUMONIA   Pneumonia Mother        DIED AGE 41,MRSA,COPD,PNEUMONIA   Supraventricular tachycardia Mother    Anxiety disorder Mother    Depression Mother    Alcohol abuse Mother    Brain cancer Son        Died at age 55   Heart disease Sister        DIED AGE 84, SMOKER,?HEART   COPD Brother        AGE 70   Colon cancer Neg Hx     Social History   Tobacco Use   Smoking status: Former    Packs/day: 0.30    Years: 10.00    Pack years: 3.00    Types: Cigarettes    Quit date: 04/23/1993    Years since quitting: 27.4   Smokeless tobacco: Never   Tobacco comments:    quit smoking 25+yrs ago  Vaping Use   Vaping Use: Never used  Substance Use Topics   Alcohol use: No    Alcohol/week: 0.0 standard drinks   Drug use: No    Home Medications Prior to Admission medications   Medication Sig Start Date End Date Taking? Authorizing Provider  albuterol (PROAIR HFA) 108 (90 Base) MCG/ACT inhaler Inhale 2 puffs  every 6 hours  for asthma - rescue inhaler Patient taking differently: Inhale 2 puffs into the lungs every 6 (six) hours as needed for wheezing or shortness of breath. 05/28/17  Yes Young, Tarri Fuller D, MD  aspirin 81 MG chewable tablet Chew 81 mg by mouth in the morning.    Yes [provider]  Baclofen 5 MG TABS Take 5 mg by mouth at bedtime.   Yes [provider]  Carboxymethylcellulose Sod PF 0.5 % SOLN Place 1 drop into both eyes 2 (two) times daily.    Yes [provider]  diltiazem (CARDIZEM CD) 180 MG 24 hr capsule Take 1 capsule (180 mg total) by mouth daily. 09/06/20  Yes Kathie Dike, MD  divalproex (DEPAKOTE) 500 MG DR tablet Take 500 mg by mouth 3 (three) times daily.    Yes [provider]  docusate sodium (COLACE) 100 MG capsule Take 100 mg by mouth in the morning.    Yes [provider]  fluticasone furoate-vilanterol (BREO ELLIPTA) 100-25 MCG/INH AEPB Inhale 1 puff into the lungs daily. 12/17/16  Yes Young, Tarri Fuller D, MD  gabapentin (NEURONTIN) 300 MG capsule Take 300 mg by mouth in the morning.   Yes [provider]  gabapentin (NEURONTIN) 600 MG tablet Take 600 mg by mouth at bedtime.   Yes [provider]  Glucagon, rDNA, (GLUCAGON EMERGENCY) 1 MG KIT Inject as directed. Inject 1 mg intramuscularly every 24 hours as needed for low blood glucose blood glucose below 60 and unable to take oral glucose replacement.   Yes [provider]  Glucerna (GLUCERNA) LIQD Take 237 mLs by mouth in the morning and at bedtime.   Yes [provider]  guaiFENesin-dextromethorphan (ROBITUSSIN DM) 100-10 MG/5ML syrup Take 5 mLs by mouth every 4 (four) hours as needed for cough.   Yes [provider]  insulin glargine (LANTUS) 100 UNIT/ML injection Inject 15 Units into the skin at bedtime.   Yes [provider]  insulin lispro (HUMALOG) 100 UNIT/ML injection Inject 0-10 Units into the skin 3 (three) times daily before meals.  0-59 call MD 60-200=0 201-250= 2 units 251-300=4 units 301-350= 6 units 351-400= 8 units 401-450= 10 units 451+ Call MD   Yes [provider]  ipratropium (ATROVENT) 0.03 % nasal spray Place 2 sprays into both nostrils 3 (three) times daily before meals.   Yes [provider]  ipratropium-albuterol (DUONEB) 0.5-2.5 (3) MG/3ML SOLN Take 3 mLs by nebulization every 6 (six) hours as needed (wheezing or shortness of breath).   Yes [provider]  lactulose (CHRONULAC) 10 GM/15ML solution Take 10 g by mouth daily as needed for mild constipation.   Yes [provider]  loperamide (IMODIUM A-D) 2 MG tablet Take 2 mg by mouth every 8 (eight) hours as needed for diarrhea or loose stools.   Yes [provider]  lubiprostone (AMITIZA) 24 MCG capsule Take 1 capsule (24 mcg total) by mouth 2 (two) times daily with a meal. 05/14/20  Yes Mahala Menghini, PA-C  magnesium hydroxide (MILK OF MAGNESIA) 400 MG/5ML suspension Take 30 mLs by mouth at bedtime as needed for mild constipation.    Yes [provider]  Menthol, Topical Analgesic, (BIOFREEZE) 4 % GEL Apply 1 application topically 2 (two) times daily. Apply to lower back   Yes [provider]  metFORMIN (GLUCOPHAGE) 500 MG tablet Take 500 mg by mouth 2 (two) times daily with a meal.   Yes [provider]  nystatin (  MYCOSTATIN/NYSTOP) powder Apply 1 application topically every 8 (eight) hours.   Yes [provider]  omega-3 acid ethyl esters (LOVAZA) 1 g capsule Take 1 g by mouth in the morning.   Yes [provider]  OXYGEN Inhale 2-5 L into the lungs as needed (if resident oxygen is 90% or less).   Yes [provider]  pantoprazole (PROTONIX) 40 MG tablet Take 40 mg by mouth in the morning.   Yes [provider]  Polyethyl Glycol-Propyl Glycol (SYSTANE) 0.4-0.3 % SOLN Place 1 drop into both eyes every 8 (eight) hours as needed (dry eyes).   Yes  [provider]  polyethylene glycol (MIRALAX / GLYCOLAX) packet Take 17 g by mouth daily as needed for moderate constipation. Patient taking differently: Take 17 g by mouth every 12 (twelve) hours as needed for moderate constipation. 10/01/17  Yes Ahmaud Duthie, Gwenyth Allegra, MD  potassium chloride SA (K-DUR,KLOR-CON) 20 MEQ tablet Take 20 mEq by mouth in the morning.   Yes [provider]  primidone (MYSOLINE) 50 MG tablet Take 50 mg by mouth in the morning.    Yes [provider]  QUEtiapine (SEROQUEL) 100 MG tablet Take 100 mg by mouth at bedtime.   Yes [provider]  senna (SENOKOT) 8.6 MG TABS tablet Take 2 tablets by mouth daily as needed for mild constipation.   Yes [provider]  sertraline (ZOLOFT) 50 MG tablet Take 50 mg by mouth daily.   Yes [provider]  sitaGLIPtin (JANUVIA) 100 MG tablet Take 100 mg by mouth in the morning.   Yes [provider]  sodium chloride (OCEAN) 0.65 % nasal spray Place 2 sprays into the nose as needed (Rhinitis). Both nostrils   Yes [provider]  sodium chloride 1 g tablet Take 1 g by mouth 2 (two) times daily.   Yes [provider]  Valbenazine Tosylate 40 MG CAPS Take 40 mg by mouth in the morning.   Yes [provider]  levothyroxine (SYNTHROID) 50 MCG tablet Take 1 tablet (50 mcg total) by mouth daily before breakfast. Patient not taking: Reported on 10/05/2020 09/06/20   Kathie Dike, MD  metoprolol succinate (TOPROL-XL) 50 MG 24 hr tablet Take 50 mg by mouth daily. Patient not taking: Reported on 10/05/2020 09/06/20   [provider]  metoprolol tartrate (LOPRESSOR) 50 MG tablet Take 1 tablet (50 mg total) by mouth 2 (two) times daily. Patient not taking: No sig reported 09/06/20   Kathie Dike, MD  polyethylene glycol-electrolytes (NULYTELY) 420 g solution As directed Patient taking differently: Take 4,000 mLs by mouth once. As directed 07/22/20   [name],  Cristopher Estimable, MD  predniSONE (DELTASONE) 20 MG tablet Take 2 tablets (40 mg total) by mouth daily. 3 days Patient not taking: No sig reported 09/06/20   Kathie Dike, MD    Allergies    Ace inhibitors, Invokana [canagliflozin], Losartan, Metformin and related, Other, Latex, Mucinex [guaifenesin er], Oxycodone-acetaminophen, and Sulfa antibiotics  Review of Systems   Review of Systems  Respiratory:  Positive for shortness of breath.   All other systems reviewed and are negative.  Physical Exam Updated Vital Signs BP (!) 110/50   Pulse 91   Temp 97.6 F (36.4 C) (Axillary)   Resp (!) 21   Ht '5\' 4"'  (1.626 m)   Wt 74.3 kg   SpO2 100%   BMI 28.12 kg/m   Physical Exam Vitals and nursing note reviewed.  Constitutional:  General: She is not in acute distress.    Appearance: Normal appearance. She is well-developed.  HENT:     Head: Normocephalic and atraumatic.     Right Ear: Hearing normal.     Left Ear: Hearing normal.     Nose: Nose normal.  Eyes:     Conjunctiva/sclera: Conjunctivae normal.     Pupils: Pupils are equal, round, and reactive to light.  Cardiovascular:     Rate and Rhythm: Regular rhythm.     Heart sounds: S1 normal and S2 normal. No murmur heard.   No friction rub. No gallop.  Pulmonary:     Effort: Tachypnea and accessory muscle usage present. No respiratory distress.     Breath sounds: Decreased air movement present. Decreased breath sounds and rhonchi present.  Chest:     Chest wall: No tenderness.  Abdominal:     General: Bowel sounds are normal.     Palpations: Abdomen is soft.     Tenderness: There is no abdominal tenderness. There is no guarding or rebound. Negative signs include Murphy's sign and McBurney's sign.     Hernia: No hernia is present.  Musculoskeletal:        General: Normal range of motion.     Cervical back: Normal range of motion and neck supple.  Skin:    General: Skin is warm and dry.     Findings: No rash.  Neurological:      Mental Status: She is alert and oriented to person, place, and time.     GCS: GCS eye subscore is 4. GCS verbal subscore is 5. GCS motor subscore is 6.     Cranial Nerves: No cranial nerve deficit.     Sensory: No sensory deficit.     Coordination: Coordination normal.  Psychiatric:        Speech: Speech normal.        Behavior: Behavior normal.        Thought Content: Thought content normal.    ED Results / Procedures / Treatments   Labs (all labs ordered are listed, but only abnormal results are displayed) Labs Reviewed  MRSA NEXT GEN BY PCR, NASAL - Abnormal; Notable for the following components:      Result Value   MRSA by PCR Next Gen DETECTED (*)    All other components within normal limits  COMPREHENSIVE METABOLIC PANEL - Abnormal; Notable for the following components:   Sodium 134 (*)    Chloride 97 (*)    Creatinine, Ser 0.42 (*)    Calcium 8.7 (*)    Albumin 2.8 (*)    All other components within normal limits  CBC WITH DIFFERENTIAL/PLATELET - Abnormal; Notable for the following components:   RBC 2.74 (*)    Hemoglobin 10.0 (*)    HCT 30.9 (*)    MCV 112.8 (*)    MCH 36.5 (*)    Platelets 133 (*)    nRBC 0.5 (*)    All other components within normal limits  URINALYSIS, ROUTINE W REFLEX MICROSCOPIC - Abnormal; Notable for the following components:   Hgb urine dipstick SMALL (*)    All other components within normal limits  BLOOD GAS, ARTERIAL - Abnormal; Notable for the following components:   pO2, Arterial 74.2 (*)    Bicarbonate 29.2 (*)    Acid-Base Excess 5.8 (*)    All other components within normal limits  URINALYSIS, MICROSCOPIC (REFLEX) - Abnormal; Notable for the following components:   Bacteria, UA FEW (*)  All other components within normal limits  RAPID URINE DRUG SCREEN, HOSP PERFORMED - Abnormal; Notable for the following components:   Barbiturates POSITIVE (*)    All other components within normal limits  GLUCOSE, CAPILLARY - Abnormal;  Notable for the following components:   Glucose-Capillary 132 (*)    All other components within normal limits  BASIC METABOLIC PANEL - Abnormal; Notable for the following components:   Glucose, Bld 217 (*)    BUN 7 (*)    Creatinine, Ser 0.43 (*)    Calcium 8.0 (*)    All other components within normal limits  CBC - Abnormal; Notable for the following components:   RBC 2.11 (*)    Hemoglobin 7.6 (*)    HCT 24.2 (*)    MCV 114.7 (*)    MCH 36.0 (*)    Platelets 88 (*)    nRBC 0.4 (*)    All other components within normal limits  IRON AND TIBC - Abnormal; Notable for the following components:   Iron 20 (*)    TIBC 185 (*)    All other components within normal limits  GLUCOSE, CAPILLARY - Abnormal; Notable for the following components:   Glucose-Capillary 153 (*)    All other components within normal limits  HEMOGLOBIN AND HEMATOCRIT, BLOOD - Abnormal; Notable for the following components:   Hemoglobin 7.0 (*)    HCT 22.0 (*)    All other components within normal limits  CULTURE, BLOOD (SINGLE)  RESP PANEL BY RT-PCR (FLU A&B, COVID) ARPGX2  CULTURE, BLOOD (ROUTINE X 2)  URINE CULTURE  EXPECTORATED SPUTUM ASSESSMENT W GRAM STAIN, RFLX TO RESP C  LACTIC ACID, PLASMA  LACTIC ACID, PLASMA  PROTIME-INR  APTT  STREP PNEUMONIAE URINARY ANTIGEN  FERRITIN  VITAMIN B12  FOLATE  LEGIONELLA PNEUMOPHILA SEROGP 1 UR AG  CBG MONITORING, ED  TYPE AND SCREEN  PREPARE RBC (CROSSMATCH)    EKG None  Radiology CT Angio Chest Pulmonary Embolism (PE) W or WO Contrast  Result Date: 10/05/2020 CLINICAL DATA:  Shortness of breath. Diagnosed with pneumonia last week. EXAM: CT ANGIOGRAPHY CHEST WITH CONTRAST TECHNIQUE: Multidetector CT imaging of the chest was performed using the standard protocol during bolus administration of intravenous contrast. Multiplanar CT image reconstructions and MIPs were obtained to evaluate the vascular anatomy. CONTRAST:  139m OMNIPAQUE IOHEXOL 350 MG/ML SOLN  COMPARISON:  Chest x-ray 10/05/2020, CT chest abdomen pelvis 09/02/2020, chest x-ray 09/10/2019 FINDINGS: Cardiovascular: Satisfactory opacification of the pulmonary arteries to the segmental level. No evidence of pulmonary embolism. Normal heart size. No significant pericardial effusion. The thoracic aorta is normal in caliber. Mild atherosclerotic plaque of the thoracic aorta. No coronary artery calcifications. Mediastinum/Nodes: Multiple prominent but not enlarged right hilar lymph nodes there are difficult to measure due to timing of intravenous contrast. No enlarged mediastinal, hilar, or axillary lymph nodes. Subcentimeter thyroid gland hypodense nodules. Patulous esophagus. Trace debris within the trachea. Otherwise trachea and esophagus demonstrate no significant findings. Lungs/Pleura: Bilateral lower lobe heterogeneous consolidations. Interval increase in patchy nodular-like airspace opacities within the dependent right lower lobe. Persistent but improved right lower lobe heterogeneous consolidation compared to CT chest 09/02/2020. Interval increase in heterogeneous left lower lobe consolidation involving the majority of the lobe. Upper Abdomen: No acute abnormality. Musculoskeletal: Bilateral chest wall neural stimulators with leads coursing cranially and tips collimated off view. No suspicious lytic or blastic osseous lesions. No acute displaced fracture. Multilevel degenerative changes of the spine. Review of the MIP images confirms the above  findings. IMPRESSION: 1. Interval worsening (compared to CT chest 09/02/2020) multifocal pneumonia/aspiration pneumonia with underlying malignancy not excluded. 2. Nodular density noted on chest x-ray correlates nodular-like airspace densities within the dependent right upper lobe. 3. No pulmonary embolus. Electronically Signed   By: Iven Finn M.D.   On: 10/05/2020 06:11   DG Chest Port 1 View  Result Date: 10/05/2020 CLINICAL DATA:  Question sepsis.   Pneumonia diagnosis last week. EXAM: PORTABLE CHEST 1 VIEW COMPARISON:  Chest x-ray 09/04/2020, CT chest 09/02/2020 FINDINGS: Bilateral chest wall neural stimulators noted with leads extending cranially and tips collimated off view. The heart size and mediastinal contours are unchanged. Residual nodular-like 1.5 cm density at the right base. Associated linear atelectasis versus scarring. Poorly visualized left base due to patient rotation. No pulmonary edema. No pleural effusion. No pneumothorax. No acute osseous abnormality. IMPRESSION: 1. Residual nodular-like 1.5 cm density at the right base. Recommend CT chest with intravenous contrast for further evaluation. 2. Limited evaluation of the left base due to patient rotation. This can be further evaluated on CT chest with intravenous contrast. Electronically Signed   By: Iven Finn M.D.   On: 10/05/2020 04:16    Procedures Procedures   Medications Ordered in ED Medications  azithromycin (ZITHROMAX) 500 mg in sodium chloride 0.9 % 250 mL IVPB (0 mg Intravenous Stopped 10/05/20 0959)  ipratropium-albuterol (DUONEB) 0.5-2.5 (3) MG/3ML nebulizer solution 3 mL (3 mLs Nebulization Given 10/06/20 0100)  albuterol (PROVENTIL) (2.5 MG/3ML) 0.083% nebulizer solution 2.5 mg (has no administration in time range)  fluticasone furoate-vilanterol (BREO ELLIPTA) 100-25 MCG/INH 1 puff (1 puff Inhalation Not Given 10/05/20 0917)  aspirin chewable tablet 81 mg (0 mg Oral Hold 10/05/20 0841)  insulin glargine (LANTUS) injection 15 Units (15 Units Subcutaneous Given 10/05/20 2256)  gabapentin (NEURONTIN) capsule 300 mg (0 mg Oral Hold 10/05/20 0843)  pantoprazole (PROTONIX) EC tablet 40 mg (0 mg Oral Hold 10/05/20 0845)  potassium chloride SA (KLOR-CON) CR tablet 20 mEq (0 mEq Oral Hold 10/05/20 0845)  sodium chloride tablet 1 g (1 g Oral Given 10/05/20 2150)  QUEtiapine (SEROQUEL) tablet 100 mg (100 mg Oral Given 10/05/20 2149)  polyethylene glycol (MIRALAX / GLYCOLAX) packet 17 g  (0 g Oral Hold 10/05/20 0845)  primidone (MYSOLINE) tablet 50 mg (0 mg Oral Hold 10/05/20 0845)  polyvinyl alcohol (LIQUIFILM TEARS) 1.4 % ophthalmic solution 1 drop (1 drop Both Eyes Given 10/05/20 2149)  divalproex (DEPAKOTE) DR tablet 500 mg (500 mg Oral Given 10/05/20 2150)  valbenazine (INGREZZA) capsule 40 mg (0 mg Oral Hold 10/05/20 0846)  omega-3 acid ethyl esters (LOVAZA) capsule 1 g (0 g Oral Hold 10/05/20 0844)  sodium chloride (OCEAN) 0.65 % nasal spray 2 spray (has no administration in time range)  baclofen (LIORESAL) tablet 5 mg (5 mg Oral Given 10/05/20 2149)  lubiprostone (AMITIZA) capsule 24 mcg (24 mcg Oral Not Given 10/05/20 1645)  Muscle Rub CREA 1 application (1 application Topical Not Given 10/05/20 0923)  sertraline (ZOLOFT) tablet 50 mg (0 mg Oral Hold 10/05/20 0846)  levothyroxine (SYNTHROID) tablet 50 mcg (50 mcg Oral Given 10/06/20 0526)  gabapentin (NEURONTIN) capsule 600 mg (600 mg Oral Given 10/05/20 2150)  feeding supplement (GLUCERNA 1.2 CAL) liquid 237 mL (237 mLs Oral Not Given 10/05/20 1407)  insulin aspart (novoLOG) injection 0-9 Units (1 Units Subcutaneous Given 10/05/20 1649)  insulin aspart (novoLOG) injection 0-5 Units (0 Units Subcutaneous Not Given 10/05/20 2211)  insulin aspart (novoLOG) injection 4 Units (4 Units Subcutaneous Not  Given 10/05/20 1645)  senna-docusate (Senokot-S) tablet 2 tablet (2 tablets Oral Given 10/05/20 2149)  methylPREDNISolone sodium succinate (SOLU-MEDROL) 40 mg/mL injection 40 mg (40 mg Intravenous Given 10/05/20 2147)  sodium chloride flush (NS) 0.9 % injection 3 mL (3 mLs Intravenous Not Given 10/05/20 2152)  sodium chloride flush (NS) 0.9 % injection 3 mL (3 mLs Intravenous Given 10/05/20 2151)  sodium chloride flush (NS) 0.9 % injection 3 mL (has no administration in time range)  0.9 %  sodium chloride infusion (has no administration in time range)  acetaminophen (TYLENOL) tablet 650 mg (has no administration in time range)    Or  acetaminophen (TYLENOL)  suppository 650 mg (has no administration in time range)  traZODone (DESYREL) tablet 50 mg (has no administration in time range)  polyethylene glycol (MIRALAX / GLYCOLAX) packet 17 g (has no administration in time range)  bisacodyl (DULCOLAX) suppository 10 mg (has no administration in time range)  ondansetron (ZOFRAN) tablet 4 mg (has no administration in time range)    Or  ondansetron (ZOFRAN) injection 4 mg (has no administration in time range)  enoxaparin (LOVENOX) injection 40 mg (40 mg Subcutaneous Given 10/05/20 0853)  ceFEPIme (MAXIPIME) 2 g in sodium chloride 0.9 % 100 mL IVPB (2 g Intravenous New Bag/Given 10/06/20 0526)  norepinephrine (LEVOPHED) 34m in 2550mpremix infusion (0 mcg/min Intravenous Hold 10/05/20 1346)  0.9 %  sodium chloride infusion ( Intravenous New Bag/Given 10/06/20 0334)  sodium chloride flush (NS) 0.9 % injection 10-40 mL (30 mLs Intracatheter Given 10/05/20 2151)  sodium chloride flush (NS) 0.9 % injection 10-40 mL (has no administration in time range)  Chlorhexidine Gluconate Cloth 2 % PADS 6 each (6 each Topical Given 10/05/20 1600)  vancomycin (VANCOCIN) IVPB 1000 mg/200 mL premix (1,000 mg Intravenous New Bag/Given 10/06/20 0334)  nystatin-triamcinolone ointment (MYCOLOG) ( Topical Not Given 10/05/20 1827)  fluconazole (DIFLUCAN) IVPB 200 mg ( Intravenous Infusion Verify 10/05/20 1832)  mupirocin ointment (BACTROBAN) 2 % 1 application (1 application Nasal Given 10/05/20 2149)  0.9 %  sodium chloride infusion (Manually program via Guardrails IV Fluids) (has no administration in time range)  iohexol (OMNIPAQUE) 350 MG/ML injection 100 mL (100 mLs Intravenous Contrast Given 10/05/20 0512)  piperacillin-tazobactam (ZOSYN) IVPB 3.375 g (0 g Intravenous Stopped 10/05/20 0744)  sodium chloride 0.9 % bolus 1,000 mL (0 mLs Intravenous Stopped 10/05/20 1347)  vancomycin (VANCOREADY) IVPB 1500 mg/300 mL (0 mg Intravenous Stopped 10/05/20 1613)    ED Course  I have reviewed the triage vital  signs and the nursing notes.  Pertinent labs & imaging results that were available during my care of the patient were reviewed by me and considered in my medical decision making (see chart for details).    MDM Rules/Calculators/A&P                          Patient presents to the emergency department for hypoxia.  Patient comes from nursing home.  Patient noted to have progressively worsening hypoxia and increased work of breathing through the course of the day.  Patient arrives on a nonrebreather to maintain her oxygen saturations.  Blood gas does not show any CO2 retention.  She is profoundly hypoxic, however, initiated on BiPAP.  Chest x-ray does not show any obvious abnormality.  Reviewing records does reveal history of aspiration.  Etiology of her hypoxia is likely recurrent aspiration.  CT angio without evidence of PE but are findings are consistent with an aspiration  pneumonia.  Added Zosyn.  Admit patient to hospital.  CRITICAL CARE Performed by: Orpah Greek   Total critical care time: 40 minutes  Critical care time was exclusive of separately billable procedures and treating other patients.  Critical care was necessary to treat or prevent imminent or life-threatening deterioration.  Critical care was time spent personally by me on the following activities: development of treatment plan with patient and/or surrogate as well as nursing, discussions with consultants, evaluation of patient's response to treatment, examination of patient, obtaining history from patient or surrogate, ordering and performing treatments and interventions, ordering and review of laboratory studies, ordering and review of radiographic studies, pulse oximetry and re-evaluation of patient's condition.   Final Clinical Impression(s) / ED Diagnoses Final diagnoses:  Dyspnea and respiratory abnormalities    Rx / DC Orders ED Discharge Orders     None        Keinan Brouillet, Gwenyth Allegra,  MD 10/06/20 [phone]

## 2020-10-05 NOTE — Progress Notes (Signed)
Bipap removed from pt per MD request to do a trial run and see how patient does. Placed on 6L HFNC. Tolerating well with sat's in the 90-94% range. Pt tolerating liquids and ice cream well. Will continue to monitor.

## 2020-10-05 NOTE — H&P (Addendum)
Patient Demographics:    [name], is a 70 y.o. female  MRN: [id number]   DOB - 01/25/1951  Admit Date - 10/05/2020  Outpatient Primary MD for the patient is Abran Richard, MD   Assessment & Plan:    Principal Problem:   Severe sepsis with Hypoxia and Hypotension- due to PNA Active Problems:   CAP (community acquired pneumonia)   Aspiration pneumonitis ([name])   Acute on chronic respiratory failure with hypoxia ([name])   Intertrigo of genitocrural region due to Candida/candidal intertrigo/severe cutaneous candidiasis   Sepsis due to PNA   Tremor, essential   Essential hypertension   Hypothyroidism   S/P deep brain stimulator placement   Obstructive sleep apnea   Type II diabetes mellitus with manifestations ([name])   Depression with somatization   MDD (major depressive disorder), recurrent severe, without psychosis ([name])   COPD with acute exacerbation ([name])    1)Severe Sepsis Secondary to Pneumonia--- patient with tachycardia, tachypnea, hypotension and worsening hypoxia becoming BiPAP dependent -Lactic acid is not elevated , WBC not elevated -Give vancomycin, azithromycin and cefepime pending culture data -Continue IV fluids -May use Levophed for pressure support if hypotension persist  2)Multifocal Pneumonia--?HCAP, ???  Aspiration related -CTA chest from 10/05/2020 shows Interval worsening (compared to CT chest 09/02/2020) multifocal pneumonia/aspiration pneumonia with underlying malignancy not excluded. -Antibiotics as above #1  3)Acute on chronic Hypoxic Respiratory Failure secondary to #1 and #2 above--- -Patient remains hypoxic despite high flow nasal cannula, required BiPAP for several hours for respiratory support -Antibiotics as above #1  4)HTN--hold metoprolol and Cardizem due to sepsis  related hypotension  5)Chronic Tremors--status post prior deep brain stimulator -Patient sees Dr. [name] (Neurology) -Continue on primidone  6)Bipolar Disorder--- okay to continue Depakote, Zoloft and Seroquel  7)DM2-A1c is 5.7 reflecting excellent diabetic control PTA Use Novolog/Humalog Sliding scale insulin with Accu-Cheks/Fingersticks as ordered   8) chronic anemia--elevated MCV and MCH noted -Hemoglobin currently at baseline which is around 10 -Iron work-up ordered -No evidence of ongoing bleeding  9) hypothyroidism--continue levothyroxine  10) social/ethics--patient has a DNR form, patient also has a MOST form, ---patient's daughter [name] is her only surviving child----[phone]------ they have not seen each other in about 5 years and they really speak.  The relationship is not very close -Patient has history of mental illness as well as prior history of substance abuse  11) candidal intertrigo/severe cutaneous candidiasis--- topical Mycolog ointment and IV Diflucan as ordered  Disposition/Need for in-Hospital Stay- patient unable to be discharged at this time due to -severe sepsis with hypoxic respiratory failure and hypotension requiring IV fluids and IV antibiotics and BiPAP for respiratory support*  Status is: Inpatient  Remains inpatient appropriate because: Please see disposition above  Dispo: The patient is from: SNF              Anticipated d/c is to: SNF  Anticipated d/c date is: 3 days              Patient currently is not medically stable to d/c. 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 [name], residual palpitations   Bipolar 1 disorder ([place])    Chronic back pain    reason unknown   Constipation    COPD (chronic obstructive pulmonary disease) ([name])    DDD (degenerative disc disease), lumbar    Depression    takes Zoloft daily as well as Cymbalta   Diabetes mellitus    Type II   Dysphagia    Dysrhythmia    SVT ==ablation done by Dr. Rayann Heman 2007   Esophageal reflux    takes Zantac daily   Frequent headaches    History of bronchitis Dec 2014   Hypertension    takes Losartan daily   Hypothyroidism    takes Synthroid daily   Joint swelling    left ankle   Low sodium syndrome    Lumbar radiculopathy    Seizures ([name]) 04-05-13   one time ran out of Primidone and had stopped it cold [place]   SVT (supraventricular tachycardia) ([name])    Tremor, essential    takes Mysoline and Neurontin daily.   Vocal cord dysfunction    Weakness    in left arm      Past Surgical History:  Procedure Laterality Date   ABDOMINAL HYSTERECTOMY     ABLATION FOR AVNRT     APPENDECTOMY     BARTHOLIN GLAND CYST EXCISION     x 2   BIOPSY  03/04/2015   Procedure: BIOPSY (Duodenal, Gastric);  Surgeon: Daneil Dolin, MD;  Location: [facility];  Service: Endoscopy;;   BIOPSY  12/19/2018   Procedure: BIOPSY;  Surgeon: Daneil Dolin, MD;  [facility];  Service: Endoscopy;;  Gastric Biopsies     BREAST SURGERY     CESAREAN SECTION     CHOLECYSTECTOMY     COLONOSCOPY  01/16/2004   TJQ:[id number] rectum/colon   COLONOSCOPY WITH PROPOFOL N/A 03/04/2015   Dr.[name]- internal hemorrhoids, pancolonic diverticulosis, colonic polyp= tubular adenoma   DEEP BRAIN STIMULATOR PLACEMENT     DENTAL SURGERY     ESOPHAGEAL DILATION N/A 03/04/2015   Procedure: ESOPHAGEAL DILATION WITH 54FR MALONEY DILATOR;  Surgeon: Daneil Dolin, MD;  Location: [facility];  Service: Endoscopy;  Laterality: N/A;   ESOPHAGEAL DILATION  12/19/2018    Procedure: ESOPHAGEAL DILATION;  Surgeon: Daneil Dolin, MD;  [facility];  Service: Endoscopy;;   ESOPHAGOGASTRODUODENOSCOPY (EGD) WITH ESOPHAGEAL DILATION  04/03/2002   RAQ:[id number] ring, otherwise normal esophagus, status post dilation with 81 F/Normal stomach   ESOPHAGOGASTRODUODENOSCOPY (EGD) WITH ESOPHAGEAL DILATION N/A 11/08/2012   Dr. [name]:schatzki's ring s/p dilation/hiatal hernia   ESOPHAGOGASTRODUODENOSCOPY (EGD) WITH PROPOFOL N/A 03/04/2015   Dr.[name]- schatzki's ring, hiatal hernia, scattered erosions bx= chronic inactive gastritis   ESOPHAGOGASTRODUODENOSCOPY (EGD) WITH PROPOFOL N/A 05/06/2017   Normal esophagus. Dilated. Small hiatal hernia. Normal  duodenal bulb and second portion of duodenum.   ESOPHAGOGASTRODUODENOSCOPY (EGD) WITH PROPOFOL N/A 12/19/2018   [name]: Mild [name] ring status post dilation, multiple erosions in the stomach, biopsies negative.   FINGER SURGERY     HEMORRHOID BANDING  2017   Dr.[name]   LEFT ANKLE LIGAMENT REPAIR     x 2   LEFT ELBOW REPAIR     MALONEY DILATION N/A 05/06/2017   Procedure: Venia Minks DILATION;  Surgeon: Daneil Dolin, MD;  [facility];  Service: Endoscopy;  Laterality: N/A;   MALONEY DILATION N/A 12/19/2018   Procedure: Venia Minks DILATION;  Surgeon: Daneil Dolin, MD;  [facility];  Service: Endoscopy;  Laterality: N/A;   MAXIMUM ACCESS (MAS)POSTERIOR LUMBAR INTERBODY FUSION (PLIF) 1 LEVEL N/A 04/24/2014   Procedure: Lumbar four-five Maximum access Surgery posterior lumbar interbody fusion;  Surgeon: [name], MD;  Location: [place] [facility];  Service: Neurosurgery;  Laterality: N/A;  Lumbar four-five Maximum access Surgery posterior lumbar interbody fusion   MULTIPLE EXTRACTIONS WITH ALVEOLOPLASTY N/A 06/18/2017   Procedure: MULTIPLE EXTRACTION;  Surgeon: Diona Browner, DDS;  Location: [place];  Service: Oral Surgery;  Laterality: N/A;   PARTIAL HYSTERECTOMY     POLYPECTOMY  03/04/2015    Procedure: POLYPECTOMY (descending colon);  Surgeon: Daneil Dolin, MD;  [facility];  Service: Endoscopy;;   PULSE GENERATOR IMPLANT Bilateral 12/15/2013   Procedure: BILATERAL PULSE GENERATOR IMPLANT;  Surgeon: [name], MD;  Location: [place] [facility];  Service: Neurosurgery;  Laterality: Bilateral;  BILATERAL   RIGHT BREAST CYST     benign   SUBTHALAMIC STIMULATOR BATTERY REPLACEMENT Bilateral 06/24/2018   Procedure: Bilateral implantable pulse generator battery change;  Surgeon: [name], MD;  Location: [place];  Service: Neurosurgery;  Laterality: Bilateral;  bilateral   SUBTHALAMIC STIMULATOR INSERTION Bilateral 12/04/2013   Procedure: Bilateral Deep brain stimulator placement;  Surgeon: [name], MD;  Location: [place] [facility];  Service: Neurosurgery;  Laterality: Bilateral;  Bilateral Deep brain stimulator placement   TONSILLECTOMY        Chief Complaint  Patient presents with   Shortness of Breath      HPI:    [name]  is a 70 y.o. female  with Pmhx of COPD/asthma, chronic respiratory failure on 2 L of oxygen, obstructive sleep apnea, tremor with deep brain stimulator, thrombocytopenia, hypothyroidism, bipolar disorder, diabetes who was recently discharged from the hospital after treatment for pneumonia presents from [facility] by EMS due to worsening hypoxia and lethargy -Patient required BiPAP in the ED for prolonged hours -Remained tachycardic, tachypneic and hypotensive required fluid boluses -Lactic acid and WBC was not elevated  -Patient is lethargic and disoriented -Phone call placed to patient's daughter, questions answered -CTA chest consistent with worsening pneumonia--concern for possible aspiration -   Review of systems:    In addition to the HPI above,   A full Review of  Systems was done, all other systems reviewed are negative except as noted above in HPI , .    Social History:  Reviewed by me    Social History    Tobacco Use   Smoking status: Former    Packs/day: 0.30    Years: 10.00    Pack years: 3.00    Types: Cigarettes    Quit date: 04/23/1993    Years since quitting: 27.4   Smokeless tobacco: Never   Tobacco comments:    quit smoking 25+yrs ago  Substance Use Topics  Alcohol use: No    Alcohol/week: 0.0 standard drinks       Family History :  Reviewed by me    Family History  Problem Relation Age of Onset   Lung cancer Father        DIED AGE 109 LUNG CA   Alcohol abuse Father    Anxiety disorder Father    Depression Father    COPD Mother        DIED AGE 55,MRSA,COPD,PNEUMONIA   Pneumonia Mother        DIED AGE 55,MRSA,COPD,PNEUMONIA   Supraventricular tachycardia Mother    Anxiety disorder Mother    Depression Mother    Alcohol abuse Mother    Brain cancer Son        Died at age 76   Heart disease Sister        DIED AGE 45, SMOKER,?HEART   COPD Brother        AGE 80   Colon cancer Neg Hx     Home Medications:   Prior to Admission medications   Medication Sig Start Date End Date Taking? Authorizing Provider  albuterol (PROAIR HFA) 108 (90 Base) MCG/ACT inhaler Inhale 2 puffs every 6 hours for asthma - rescue inhaler Patient taking differently: Inhale 2 puffs into the lungs every 6 (six) hours as needed for wheezing or shortness of breath. 05/28/17   Deneise Lever, MD  aspirin 81 MG chewable tablet Chew 81 mg by mouth in the morning.     [provider]  Baclofen 5 MG TABS Take 5 mg by mouth at bedtime.    [provider]  Carboxymethylcellulose Sod PF 0.5 % SOLN Place 1 drop into both eyes 2 (two) times daily.     [provider]  cefdinir (OMNICEF) 300 MG capsule Take 300 mg by mouth 2 (two) times daily. 09/28/20   [provider]  diltiazem (CARDIZEM CD) 180 MG 24 hr capsule Take 1 capsule (180 mg total) by mouth daily. 09/06/20   Kathie Dike, MD  divalproex (DEPAKOTE) 500 MG DR tablet Take 500 mg by mouth 3 (three) times  daily.     [provider]  docusate sodium (COLACE) 100 MG capsule Take 100 mg by mouth in the morning.     [provider]  fluticasone furoate-vilanterol (BREO ELLIPTA) 100-25 MCG/INH AEPB Inhale 1 puff into the lungs daily. 12/17/16   Baird Lyons D, MD  gabapentin (NEURONTIN) 300 MG capsule Take 300 mg by mouth in the morning.    [provider]  gabapentin (NEURONTIN) 600 MG tablet Take 600 mg by mouth at bedtime.    [provider]  Glucagon, rDNA, (GLUCAGON EMERGENCY) 1 MG KIT Inject as directed. Inject 1 mg intramuscularly every 24 hours as needed for low blood glucose blood glucose below 60 and unable to take oral glucose replacement.    [provider]  Glucerna (GLUCERNA) LIQD Take 237 mLs by mouth in the morning and at bedtime.    [provider]  guaiFENesin-dextromethorphan (ROBITUSSIN DM) 100-10 MG/5ML syrup Take 5 mLs by mouth every 4 (four) hours as needed for cough.    [provider]  insulin glargine (LANTUS) 100 UNIT/ML injection Inject 15 Units into the skin at bedtime.    [provider]  insulin lispro (HUMALOG) 100 UNIT/ML injection Inject 0-10 Units into the skin 3 (three) times daily before meals. 0-59 call MD 60-200=0 201-250= 2 units 251-300=4 units 301-350= 6 units 351-400= 8 units 401-450=  10 units 451+ Call MD    [provider]  ipratropium (ATROVENT) 0.03 % nasal spray Place 2 sprays into both nostrils 3 (three) times daily before meals.    [provider]  ipratropium-albuterol (DUONEB) 0.5-2.5 (3) MG/3ML SOLN Take 3 mLs by nebulization every 6 (six) hours as needed (wheezing or shortness of breath).    [provider]  lactulose (CHRONULAC) 10 GM/15ML solution Take 10 g by mouth daily as needed for mild constipation.    [provider]  levothyroxine (SYNTHROID) 50 MCG tablet Take 1 tablet (50 mcg total) by mouth daily before breakfast. 09/06/20   Kathie Dike, MD  loperamide (IMODIUM A-D) 2 MG tablet Take 2 mg by mouth every 8 (eight) hours as needed for diarrhea or loose stools.    [provider]  lubiprostone (AMITIZA) 24 MCG capsule Take 1 capsule (24 mcg total) by mouth 2 (two) times daily with a meal. 05/14/20   Mahala Menghini, PA-C  magnesium hydroxide (MILK OF MAGNESIA) 400 MG/5ML suspension Take 30 mLs by mouth at bedtime as needed for mild constipation.     [provider]  Menthol, Topical Analgesic, (BIOFREEZE) 4 % GEL Apply 1 application topically 2 (two) times daily. Apply to lower back    [provider]  metFORMIN (GLUCOPHAGE) 500 MG tablet Take 500 mg by mouth 2 (two) times daily with a meal.    [provider]  metoprolol succinate (TOPROL-XL) 50 MG 24 hr tablet Take 50 mg by mouth daily. 09/06/20   [provider]  metoprolol tartrate (LOPRESSOR) 50 MG tablet Take 1 tablet (50 mg total) by mouth 2 (two) times daily. Patient not taking: No sig reported 09/06/20   Kathie Dike, MD  nystatin (MYCOSTATIN/NYSTOP) powder Apply 1 application topically every 8 (eight) hours.    [provider]  omega-3 acid ethyl esters (LOVAZA) 1 g capsule Take 1 g by mouth in the morning.    [provider]  OXYGEN Inhale 2-5 L into the lungs as needed (if resident oxygen is 90% or less).    [provider]  pantoprazole (PROTONIX) 40 MG tablet Take 40 mg by mouth in the morning.    [provider]  Polyethyl Glycol-Propyl Glycol (SYSTANE) 0.4-0.3 % SOLN Place 1 drop into both eyes every 8 (eight) hours as needed (dry eyes).    [provider]  polyethylene glycol (MIRALAX / GLYCOLAX) packet Take 17 g by mouth daily as needed for moderate constipation. Patient taking differently: Take 17 g by mouth every 12 (twelve) hours as needed for moderate constipation. 10/01/17   Orpah Greek, MD  polyethylene glycol-electrolytes (NULYTELY) 420 g solution As  directed 07/22/20   [name], Cristopher Estimable, MD  potassium chloride SA (K-DUR,KLOR-CON) 20 MEQ tablet Take 20 mEq by mouth in the morning.    [provider]  predniSONE (DELTASONE) 20 MG tablet Take 2 tablets (40 mg total) by mouth daily. 3 days Patient not taking: No sig reported 09/06/20   Kathie Dike, MD  primidone (MYSOLINE) 50 MG tablet Take 50 mg by mouth in the morning.     [provider]  QUEtiapine (SEROQUEL) 100 MG tablet Take 100 mg by mouth at bedtime.    [provider]  senna (SENOKOT) 8.6 MG TABS tablet Take 2 tablets by mouth daily as needed for mild constipation.    [provider]  sertraline (ZOLOFT) 50 MG tablet Take 50 mg by mouth daily.  [provider]  sitaGLIPtin (JANUVIA) 100 MG tablet Take 100 mg by mouth in the morning.    [provider]  sodium chloride (OCEAN) 0.65 % nasal spray Place 2 sprays into the nose as needed (Rhinitis). Both nostrils    [provider]  sodium chloride 1 g tablet Take 1 g by mouth 2 (two) times daily.    [provider]  Valbenazine Tosylate 40 MG CAPS Take 40 mg by mouth in the morning.    [provider]     Allergies:     Allergies  Allergen Reactions   Invokana [Canagliflozin] Other (See Comments)    Yeast infectios   Losartan Other (See Comments)    Angioedema    Metformin And Related Diarrhea   Ace Inhibitors     History of angioedema with losartan, ARB   Latex Other (See Comments)    Other Reaction: redness, blisters   Other Other (See Comments)    Air fresheners - on MAR   Oxycodone-Acetaminophen Itching and Other (See Comments)    TYLOX. Caused internal itching per patient    Sulfa Antibiotics Other (See Comments)   Mucinex [Guaifenesin Er] Other (See Comments)    MUCINEX REACTION: asthma attacks     Physical Exam:   Vitals  Blood pressure (!) 115/53, pulse (!) 102, temperature 97.7 F (36.5 C), temperature source Oral, resp. rate  17, height '5\' 4"'  (1.626 m), weight 74.3 kg, SpO2 100 %.  Physical Examination: General appearance -lethargic, wakes up to answer questions mental status -intermittently disoriented, HEAD--left frontal area with neurostimulator Eyes - sclera anicteric Neck - supple, no JVD elevation,neurostimulator wires palpated in the neck area Chest -diminished breath sounds with scattered rhonchi bilaterally  heart - S1 and S2 normal, regular , tachycardic Abdomen - soft, nontender, nondistended, +BS Neurological -generalized weakness, lethargy, disorientation  extremities - no pedal edema noted, intact peripheral pulses  Skin/GU-extensive cutaneous candidiasis/candidal intertrigo please see photos in epic    Data Review:    CBC Recent Labs  Lab 10/05/20 0346  WBC 5.8  HGB 10.0*  HCT 30.9*  PLT 133*  MCV 112.8*  MCH 36.5*  MCHC 32.4  RDW 15.5  LYMPHSABS 1.9  MONOABS 0.4  EOSABS 0.0  BASOSABS 0.0    Chemistries  Recent Labs  Lab 10/05/20 0346  NA 134*  K 3.7  CL 97*  CO2 29  GLUCOSE 87  BUN 10  CREATININE 0.42*  CALCIUM 8.7*  AST 16  ALT 8  ALKPHOS 47  BILITOT 0.5   ------------------------------------------------------------------------------------------------------------------ estimated creatinine clearance is 63.6 mL/min (A) (by C-G formula based on SCr of 0.42 mg/dL (L)). ------------------------------------------------------------------------------------------------------------------ No results for input(s): TSH, T4TOTAL, T3FREE, THYROIDAB in the last 72 hours.  Invalid input(s): FREET3   Coagulation profile Recent Labs  Lab 10/05/20 0346  INR 1.2   ------------------------------------------------------------------------------------------------------------------- No results for input(s): DDIMER in the last 72 hours. -------------------------------------------------------------------------------------------------------------------  Cardiac Enzymes No results  for input(s): CKMB, TROPONINI, MYOGLOBIN in the last 168 hours.  Invalid input(s): CK ------------------------------------------------------------------------------------------------------------------    Component Value Date/Time   BNP 81.0 08/27/2019 1156    Urinalysis    Component Value Date/Time   COLORURINE YELLOW 10/05/2020 [place] 10/05/2020 0953   LABSPEC 1.010 10/05/2020 0953   PHURINE 5.5 10/05/2020 [place] 10/05/2020 [place] 02/13/2015 1455   [place] (A) 10/05/2020 [place] NEGATIVE 10/05/2020 [place] 10/05/2020 7616  PROTEINUR NEGATIVE 10/05/2020 0953   UROBILINOGEN 0.2 02/13/2015 1455   NITRITE NEGATIVE 10/05/2020 0953   LEUKOCYTESUR NEGATIVE 10/05/2020 8546    ----------------------------------------------------------------------------------------------------------------   Imaging Results:    CT Angio Chest Pulmonary Embolism (PE) W or WO Contrast  Result Date: 10/05/2020 CLINICAL DATA:  Shortness of breath. Diagnosed with pneumonia last week. EXAM: CT ANGIOGRAPHY CHEST WITH CONTRAST TECHNIQUE: Multidetector CT imaging of the chest was performed using the standard protocol during bolus administration of intravenous contrast. Multiplanar CT image reconstructions and MIPs were obtained to evaluate the vascular anatomy. CONTRAST:  166m OMNIPAQUE IOHEXOL 350 MG/ML SOLN COMPARISON:  Chest x-ray 10/05/2020, CT chest abdomen pelvis 09/02/2020, chest x-ray 09/10/2019 FINDINGS: Cardiovascular: Satisfactory opacification of the pulmonary arteries to the segmental level. No evidence of pulmonary embolism. Normal heart size. No significant pericardial effusion. The thoracic aorta is normal in caliber. Mild atherosclerotic plaque of the thoracic aorta. No coronary artery calcifications. Mediastinum/Nodes: Multiple prominent but not enlarged right hilar lymph nodes there are difficult to measure due  to timing of intravenous contrast. No enlarged mediastinal, hilar, or axillary lymph nodes. Subcentimeter thyroid gland hypodense nodules. Patulous esophagus. Trace debris within the trachea. Otherwise trachea and esophagus demonstrate no significant findings. Lungs/Pleura: Bilateral lower lobe heterogeneous consolidations. Interval increase in patchy nodular-like airspace opacities within the dependent right lower lobe. Persistent but improved right lower lobe heterogeneous consolidation compared to CT chest 09/02/2020. Interval increase in heterogeneous left lower lobe consolidation involving the majority of the lobe. Upper Abdomen: No acute abnormality. Musculoskeletal: Bilateral chest wall neural stimulators with leads coursing cranially and tips collimated off view. No suspicious lytic or blastic osseous lesions. No acute displaced fracture. Multilevel degenerative changes of the spine. Review of the MIP images confirms the above findings. IMPRESSION: 1. Interval worsening (compared to CT chest 09/02/2020) multifocal pneumonia/aspiration pneumonia with underlying malignancy not excluded. 2. Nodular density noted on chest x-ray correlates nodular-like airspace densities within the dependent right upper lobe. 3. No pulmonary embolus. Electronically Signed   By: MIven FinnM.D.   On: 10/05/2020 06:11   DG Chest Port 1 View  Result Date: 10/05/2020 CLINICAL DATA:  Question sepsis.  Pneumonia diagnosis last week. EXAM: PORTABLE CHEST 1 VIEW COMPARISON:  Chest x-ray 09/04/2020, CT chest 09/02/2020 FINDINGS: Bilateral chest wall neural stimulators noted with leads extending cranially and tips collimated off view. The heart size and mediastinal contours are unchanged. Residual nodular-like 1.5 cm density at the right base. Associated linear atelectasis versus scarring. Poorly visualized left base due to patient rotation. No pulmonary edema. No pleural effusion. No pneumothorax. No acute osseous abnormality.  IMPRESSION: 1. Residual nodular-like 1.5 cm density at the right base. Recommend CT chest with intravenous contrast for further evaluation. 2. Limited evaluation of the left base due to patient rotation. This can be further evaluated on CT chest with intravenous contrast. Electronically Signed   By: MIven FinnM.D.   On: 10/05/2020 04:16    Radiological Exams on Admission: CT Angio Chest Pulmonary Embolism (PE) W or WO Contrast  Result Date: 10/05/2020 CLINICAL DATA:  Shortness of breath. Diagnosed with pneumonia last week. EXAM: CT ANGIOGRAPHY CHEST WITH CONTRAST TECHNIQUE: Multidetector CT imaging of the chest was performed using the standard protocol during bolus administration of intravenous contrast. Multiplanar CT image reconstructions and MIPs were obtained to evaluate the vascular anatomy. CONTRAST:  1087mOMNIPAQUE IOHEXOL 350 MG/ML SOLN COMPARISON:  Chest x-ray 10/05/2020, CT chest abdomen pelvis 09/02/2020, chest x-ray 09/10/2019 FINDINGS: Cardiovascular: Satisfactory opacification of the pulmonary  arteries to the segmental level. No evidence of pulmonary embolism. Normal heart size. No significant pericardial effusion. The thoracic aorta is normal in caliber. Mild atherosclerotic plaque of the thoracic aorta. No coronary artery calcifications. Mediastinum/Nodes: Multiple prominent but not enlarged right hilar lymph nodes there are difficult to measure due to timing of intravenous contrast. No enlarged mediastinal, hilar, or axillary lymph nodes. Subcentimeter thyroid gland hypodense nodules. Patulous esophagus. Trace debris within the trachea. Otherwise trachea and esophagus demonstrate no significant findings. Lungs/Pleura: Bilateral lower lobe heterogeneous consolidations. Interval increase in patchy nodular-like airspace opacities within the dependent right lower lobe. Persistent but improved right lower lobe heterogeneous consolidation compared to CT chest 09/02/2020. Interval increase in  heterogeneous left lower lobe consolidation involving the majority of the lobe. Upper Abdomen: No acute abnormality. Musculoskeletal: Bilateral chest wall neural stimulators with leads coursing cranially and tips collimated off view. No suspicious lytic or blastic osseous lesions. No acute displaced fracture. Multilevel degenerative changes of the spine. Review of the MIP images confirms the above findings. IMPRESSION: 1. Interval worsening (compared to CT chest 09/02/2020) multifocal pneumonia/aspiration pneumonia with underlying malignancy not excluded. 2. Nodular density noted on chest x-ray correlates nodular-like airspace densities within the dependent right upper lobe. 3. No pulmonary embolus. Electronically Signed   By: Iven Finn M.D.   On: 10/05/2020 06:11   DG Chest Port 1 View  Result Date: 10/05/2020 CLINICAL DATA:  Question sepsis.  Pneumonia diagnosis last week. EXAM: PORTABLE CHEST 1 VIEW COMPARISON:  Chest x-ray 09/04/2020, CT chest 09/02/2020 FINDINGS: Bilateral chest wall neural stimulators noted with leads extending cranially and tips collimated off view. The heart size and mediastinal contours are unchanged. Residual nodular-like 1.5 cm density at the right base. Associated linear atelectasis versus scarring. Poorly visualized left base due to patient rotation. No pulmonary edema. No pleural effusion. No pneumothorax. No acute osseous abnormality. IMPRESSION: 1. Residual nodular-like 1.5 cm density at the right base. Recommend CT chest with intravenous contrast for further evaluation. 2. Limited evaluation of the left base due to patient rotation. This can be further evaluated on CT chest with intravenous contrast. Electronically Signed   By: Iven Finn M.D.   On: 10/05/2020 04:16    DVT Prophylaxis -SCD /lovenox AM Labs Ordered, also please review Full Orders  Family Communication: Admission, patients condition and plan of care including tests being ordered have been discussed  with the patient and daughter  who indicate understanding and agree with the plan   Code Status - DNR  Likely DC to  back to SNF after resolution of sepsis pathophysiology  Condition   -Fair  Roxan Hockey M.D on 10/05/2020 at 5:33 PM Go to [email or web address] -  for contact info  [facility] - Office  [phone]

## 2020-10-05 NOTE — Op Note (Addendum)
Patient:  [name]  DOB:  1950-10-28  MRN:  [id number]   Preop Diagnosis: Hypotension, COPD  Postop Diagnosis: Same  Procedure: Central line placement  Surgeon: Aviva Signs, MD  Anes: Local  Indications: Patient is a 70 year old white female who is being admitted to the hospital for acute exacerbation of her COPD as well as hypotension.  Due to the need for possible pressor support, the patient needs central line placement.  The risks and benefits of the procedure were explained to the patient, who gave informed consent.  Bilateral chest wall stimulators in place, thus will place femoral line.  Procedure note: The procedure was done at bedside in the emergency room.  Full sterile technique was performed.  The right groin was prepped and draped using the usual sterile technique with ChloraPrep.  Surgical site confirmation was performed.  1% Xylocaine was used for local anesthesia.  The right femoral vein was accessed using the Seldinger technique without difficulty.  A guidewire was then passed.  An introducer was placed over the guidewire.  The triple-lumen catheter was then placed over the guidewire and the guidewire was removed.  Good backflow of venous blood was noted on aspiration of all 3 ports.  All 3 ports were flushed with saline.  It was secured in the place using 3-0 silk sutures.  A dry sterile dressing was applied.  The patient tolerated the procedure well.  Complications: None  EBL: Minimal  Specimen: None

## 2020-10-06 ENCOUNTER — Observation Stay (HOSPITAL_COMMUNITY): Payer: 59

## 2020-10-06 DIAGNOSIS — L304 Erythema intertrigo: Secondary | ICD-10-CM | POA: Diagnosis present

## 2020-10-06 DIAGNOSIS — R6520 Severe sepsis without septic shock: Secondary | ICD-10-CM | POA: Diagnosis present

## 2020-10-06 DIAGNOSIS — J180 Bronchopneumonia, unspecified organism: Secondary | ICD-10-CM | POA: Diagnosis present

## 2020-10-06 DIAGNOSIS — F419 Anxiety disorder, unspecified: Secondary | ICD-10-CM | POA: Diagnosis present

## 2020-10-06 DIAGNOSIS — E119 Type 2 diabetes mellitus without complications: Secondary | ICD-10-CM | POA: Diagnosis present

## 2020-10-06 DIAGNOSIS — B372 Candidiasis of skin and nail: Secondary | ICD-10-CM | POA: Diagnosis present

## 2020-10-06 DIAGNOSIS — E039 Hypothyroidism, unspecified: Secondary | ICD-10-CM | POA: Diagnosis present

## 2020-10-06 DIAGNOSIS — L8990 Pressure ulcer of unspecified site, unspecified stage: Secondary | ICD-10-CM | POA: Insufficient documentation

## 2020-10-06 DIAGNOSIS — D6489 Other specified anemias: Secondary | ICD-10-CM | POA: Diagnosis present

## 2020-10-06 DIAGNOSIS — Z90711 Acquired absence of uterus with remaining cervical stump: Secondary | ICD-10-CM | POA: Diagnosis not present

## 2020-10-06 DIAGNOSIS — J690 Pneumonitis due to inhalation of food and vomit: Secondary | ICD-10-CM | POA: Diagnosis present

## 2020-10-06 DIAGNOSIS — F319 Bipolar disorder, unspecified: Secondary | ICD-10-CM | POA: Diagnosis present

## 2020-10-06 DIAGNOSIS — D6959 Other secondary thrombocytopenia: Secondary | ICD-10-CM | POA: Diagnosis present

## 2020-10-06 DIAGNOSIS — Z9981 Dependence on supplemental oxygen: Secondary | ICD-10-CM | POA: Diagnosis not present

## 2020-10-06 DIAGNOSIS — Z8249 Family history of ischemic heart disease and other diseases of the circulatory system: Secondary | ICD-10-CM | POA: Diagnosis not present

## 2020-10-06 DIAGNOSIS — G4733 Obstructive sleep apnea (adult) (pediatric): Secondary | ICD-10-CM | POA: Diagnosis present

## 2020-10-06 DIAGNOSIS — G250 Essential tremor: Secondary | ICD-10-CM | POA: Diagnosis present

## 2020-10-06 DIAGNOSIS — J189 Pneumonia, unspecified organism: Secondary | ICD-10-CM | POA: Diagnosis present

## 2020-10-06 DIAGNOSIS — K219 Gastro-esophageal reflux disease without esophagitis: Secondary | ICD-10-CM | POA: Diagnosis present

## 2020-10-06 DIAGNOSIS — I10 Essential (primary) hypertension: Secondary | ICD-10-CM | POA: Diagnosis present

## 2020-10-06 DIAGNOSIS — Z66 Do not resuscitate: Secondary | ICD-10-CM | POA: Diagnosis present

## 2020-10-06 DIAGNOSIS — J9621 Acute and chronic respiratory failure with hypoxia: Secondary | ICD-10-CM | POA: Diagnosis present

## 2020-10-06 DIAGNOSIS — A419 Sepsis, unspecified organism: Secondary | ICD-10-CM | POA: Diagnosis present

## 2020-10-06 DIAGNOSIS — Z20822 Contact with and (suspected) exposure to covid-19: Secondary | ICD-10-CM | POA: Diagnosis present

## 2020-10-06 DIAGNOSIS — J441 Chronic obstructive pulmonary disease with (acute) exacerbation: Secondary | ICD-10-CM | POA: Diagnosis present

## 2020-10-06 DIAGNOSIS — B373 Candidiasis of vulva and vagina: Secondary | ICD-10-CM | POA: Diagnosis present

## 2020-10-06 LAB — CBC
HCT: 24.2 % — ABNORMAL LOW (ref 36.0–46.0)
HCT: 21.8 % — ABNORMAL LOW (ref 36.0–46.0)
Hemoglobin: 7.6 g/dL — ABNORMAL LOW (ref 12.0–15.0)
Hemoglobin: 7.1 g/dL — ABNORMAL LOW (ref 12.0–15.0)
MCH: 36 pg — ABNORMAL HIGH (ref 26.0–34.0)
MCH: 37.4 pg — ABNORMAL HIGH (ref 26.0–34.0)
MCHC: 31.4 g/dL (ref 30.0–36.0)
MCHC: 32.6 g/dL (ref 30.0–36.0)
MCV: 114.7 fL — ABNORMAL HIGH (ref 80.0–100.0)
MCV: 114.7 fL — ABNORMAL HIGH (ref 80.0–100.0)
Platelets: 88 10*3/uL — ABNORMAL LOW (ref 150–400)
Platelets: 98 10*3/uL — ABNORMAL LOW (ref 150–400)
RBC: 2.11 MIL/uL — ABNORMAL LOW (ref 3.87–5.11)
RBC: 1.9 MIL/uL — ABNORMAL LOW (ref 3.87–5.11)
RDW: 15.2 % (ref 11.5–15.5)
RDW: 15.8 % — ABNORMAL HIGH (ref 11.5–15.5)
WBC: 5.3 10*3/uL (ref 4.0–10.5)
WBC: 6.8 10*3/uL (ref 4.0–10.5)
nRBC: 0.4 % — ABNORMAL HIGH (ref 0.0–0.2)
nRBC: 0.3 % — ABNORMAL HIGH (ref 0.0–0.2)

## 2020-10-06 LAB — GLUCOSE, CAPILLARY
Glucose-Capillary: 196 mg/dL — ABNORMAL HIGH (ref 70–99)
Glucose-Capillary: 200 mg/dL — ABNORMAL HIGH (ref 70–99)
Glucose-Capillary: 232 mg/dL — ABNORMAL HIGH (ref 70–99)
Glucose-Capillary: 140 mg/dL — ABNORMAL HIGH (ref 70–99)

## 2020-10-06 LAB — IRON AND TIBC
Iron: 20 ug/dL — ABNORMAL LOW (ref 28–170)
Saturation Ratios: 11 % (ref 10.4–31.8)
TIBC: 185 ug/dL — ABNORMAL LOW (ref 250–450)
UIBC: 165 ug/dL

## 2020-10-06 LAB — BASIC METABOLIC PANEL
Anion gap: 6 (ref 5–15)
BUN: 7 mg/dL — ABNORMAL LOW (ref 8–23)
CO2: 28 mmol/L (ref 22–32)
Calcium: 8 mg/dL — ABNORMAL LOW (ref 8.9–10.3)
Chloride: 104 mmol/L (ref 98–111)
Creatinine, Ser: 0.43 mg/dL — ABNORMAL LOW (ref 0.44–1.00)
GFR, Estimated: >60 mL/min (ref >60)
Glucose, Bld: 217 mg/dL — ABNORMAL HIGH (ref 70–99)
Potassium: 4.2 mmol/L (ref 3.5–5.1)
Sodium: 138 mmol/L (ref 135–145)

## 2020-10-06 LAB — HEMOGLOBIN AND HEMATOCRIT, BLOOD
HCT: 22 % — ABNORMAL LOW (ref 36.0–46.0)
Hemoglobin: 7 g/dL — ABNORMAL LOW (ref 12.0–15.0)

## 2020-10-06 LAB — PREPARE RBC (CROSSMATCH)

## 2020-10-06 LAB — FOLATE: Folate: 8 ng/mL (ref >5.9)

## 2020-10-06 LAB — FERRITIN: Ferritin: 214 ng/mL (ref 11–307)

## 2020-10-06 LAB — VITAMIN B12: Vitamin B-12: 675 pg/mL (ref 180–914)

## 2020-10-06 MED ORDER — QUETIAPINE FUMARATE 25 MG PO TABS
25.0000 mg | ORAL_TABLET | Freq: Every day | ORAL | Status: DC
  Administered 2020-10-06 – 2020-10-07 (×2): 25 mg via ORAL
  Filled 2020-10-06 (×2): qty 1

## 2020-10-06 MED ORDER — NYSTATIN 100000 UNIT/GM EX OINT
TOPICAL_OINTMENT | Freq: Three times a day (TID) | CUTANEOUS | Status: DC
  Administered 2020-10-06: 1 via TOPICAL
  Filled 2020-10-06: qty 15

## 2020-10-06 MED ORDER — SODIUM CHLORIDE 0.9% IV SOLUTION: Freq: Once | INTRAVENOUS | Status: DC

## 2020-10-06 MED ORDER — TRIAMCINOLONE ACETONIDE 0.1 % EX OINT
TOPICAL_OINTMENT | Freq: Three times a day (TID) | CUTANEOUS | Status: DC
  Administered 2020-10-06: 1 via TOPICAL
  Filled 2020-10-06: qty 15

## 2020-10-06 MED ORDER — FUROSEMIDE 10 MG/ML IJ SOLN
20.0000 mg | Freq: Once | INTRAMUSCULAR | Status: AC
Start: 2020-10-06 — End: 2020-10-06
  Administered 2020-10-06: 20 mg via INTRAVENOUS
  Filled 2020-10-06: qty 2

## 2020-10-06 MED ORDER — SODIUM CHLORIDE 0.9% IV SOLUTION: Freq: Once | INTRAVENOUS | Status: AC

## 2020-10-06 NOTE — Progress Notes (Signed)
RN called due to patient's hemoglobin dropping about 3 units within last 24 hours (Hgb 10 >>7.6 >>7.0).  There is no obvious sign of bleeding and this appears to be dilutional since patient has received about 4 L of IV fluid per RN.  Type and screen will be done and 2 units of blood will be reserved without need to start transfusion at this time.

## 2020-10-06 NOTE — Progress Notes (Signed)
Held pressure to patient's femoral central line site for 10 minutes, changed femoral line dressing using sterile technique. Patient sats 94% on RA. Assisted with meal set up for lunch and provided patient coffee.

## 2020-10-06 NOTE — Progress Notes (Addendum)
Patient Demographics:    [name], is a 70 y.o. female, DOB - 07/20/1950, XLK:[id number]  Admit date - 10/05/2020   Admitting Physician Pressley Tadesse Denton Brick, MD  Outpatient Primary MD for the patient is Elizabeth Richard, MD  LOS - 0   Chief Complaint  Patient presents with   Shortness of Breath        Subjective:    [name] today has no fevers, no emesis,  No chest pain, -Somewhat lethargic , no obvious bleeding concerns except for right femoral catheter site that is slightly oozing Cough dyspnea and hypoxia persist---  -overall respiratory status improving- -Patient remains off BiPAP -Weaning off oxygen nicely  Assessment  & Plan :    Principal Problem:   Severe sepsis with Hypoxia and Hypotension- due to PNA Active Problems:   CAP (community acquired pneumonia)   Aspiration pneumonitis ([name])   Acute on chronic respiratory failure with hypoxia ([name])   Intertrigo of genitocrural region due to Candida/candidal intertrigo/severe cutaneous candidiasis   Sepsis due to PNA   Tremor, essential   Essential hypertension   Hypothyroidism   S/P deep brain stimulator placement   Obstructive sleep apnea   Type II diabetes mellitus with manifestations ([name])   Depression with somatization   MDD (major depressive disorder), recurrent severe, without psychosis ([name])   COPD with acute exacerbation ([name])   Pressure injury of skin   PNA (pneumonia)   A/p 1)Severe Sepsis Secondary to Pneumonia--- POA -On admission patient had tachycardia, tachypnea, hypotension and worsening hypoxia becoming BiPAP dependent -Lactic acid is not elevated , WBC not elevated -Continue vancomycin, azithromycin and cefepime pending culture data -Continue IV fluids -May use Levophed for pressure support if hypotension persist -Cough and dyspnea persist -Hypoxia improving weaning off oxygen -Repeat chest x-ray on  10/07/2020   2)Multifocal Pneumonia--?HCAP, ???  Aspiration related -CTA chest from 10/05/2020 shows Interval worsening (compared to CT chest 09/02/2020) multifocal pneumonia/aspiration pneumonia with underlying malignancy not excluded. -Antibiotics as above #1   3)Acute on chronic Hypoxic Respiratory Failure secondary to #1 and #2 above--- -Patient remains hypoxic despite high flow nasal cannula, required BiPAP for several hours for respiratory support -Antibiotics as above #1 -Patient remains off BiPAP -Weaning off oxygen nicely   4)HTN--hold metoprolol and Cardizem due to sepsis related hypotension   5)Chronic Tremors--status post prior deep brain stimulator -Patient sees Dr. Wells Guiles [name] (Neurology) -Continue on primidone   6)Bipolar Disorder---  continue Depakote, Zoloft, will decrease Seroquel to 25 mg nightly due to excessive sleepiness   7)DM2-A1c is 5.7 reflecting excellent diabetic control PTA -Continue Lantus insulin 15 units along with sliding scale coverage Use Novolog/Humalog Sliding scale insulin with Accu-Cheks/Fingersticks as ordered    8) acute on chronic chronic anemia--elevated MCV and MCH noted -Hemoglobin is down to 7.6 from 10.0 on admission, baseline appears to be around 9 usually--- suspect some component of hemodilution due to IV fluids for sepsis protocol -B12 and folate are not low, ferritin WNL, serum iron is 20 ,TIBC 185 -Iron work-up ordered -No evidence of ongoing bleeding -Patient states she is not a Jehovah Witness, RN Collene Leyden and myself spoke with patient about possible blood transfusion--- patient is coherent, states she has No objection to blood transfusion if needed  9) hypothyroidism--continue levothyroxine   10) social/ethics--patient has a DNR form, patient also has a MOST form, ---patient's daughter [name] is her only surviving child----[phone]------ they have not seen each other in about 5 years and they rarely speak.  The  relationship is not very close -Patient has history of mental illness as well as prior history of substance abuse   11) candidal intertrigo/severe cutaneous candidiasis--- rather extensive candidiasis, see photos in epic -- topical Mycolog ointment and IV Diflucan as ordered  12) thrombocytopenia----monitor closely no active bleeding noted, platelet count currently close to prior baseline   Disposition/Need for in-Hospital Stay- patient unable to be discharged at this time due to -severe sepsis with hypoxic respiratory failure and hypotension requiring IV fluids and IV antibiotics and BiPAP for respiratory support*   Status is: Inpatient   Remains inpatient appropriate because: Please see disposition above   Dispo: The patient is from: SNF              Anticipated d/c is to: SNF              Anticipated d/c date is: 3 days              Patient currently is not medically stable to d/c. Barriers: Not Clinically Stable-  Code Status :  -  Code Status: DNR   Family Communication:   (patient is alert, awake and coherent)  -daughter [name] is her only surviving child----[phone]------ they have not seen each other in about 5 years and they rarely speak. Consults  :  na  DVT Prophylaxis  :   - SCDs  enoxaparin (LOVENOX) injection 40 mg Start: 10/05/20 0830 SCDs Start: 10/05/20 0818 Place TED hose Start: 10/05/20 0818    Lab Results  Component Value Date   PLT 88 (L) 10/06/2020    Inpatient Medications  Scheduled Meds:  sodium chloride   Intravenous Once   aspirin  81 mg Oral q AM   baclofen  5 mg Oral QHS   Chlorhexidine Gluconate Cloth  6 each Topical Daily   divalproex  500 mg Oral TID   enoxaparin (LOVENOX) injection  40 mg Subcutaneous Q24H   feeding supplement (GLUCERNA 1.2 CAL)  237 mL Oral BID BM   fluticasone furoate-vilanterol  1 puff Inhalation Daily   gabapentin  300 mg Oral q AM   gabapentin  600 mg Oral QHS   insulin aspart  0-5 Units Subcutaneous QHS    insulin aspart  0-9 Units Subcutaneous TID WC   insulin aspart  4 Units Subcutaneous TID WC   insulin glargine  15 Units Subcutaneous QHS   ipratropium-albuterol  3 mL Nebulization Q6H   levothyroxine  50 mcg Oral QAC breakfast   lubiprostone  24 mcg Oral BID WC   methylPREDNISolone (SOLU-MEDROL) injection  40 mg Intravenous Q12H   mupirocin ointment  1 application Nasal BID   Muscle Rub  1 application Topical BID   nystatin ointment   Topical TID   omega-3 acid ethyl esters  1 g Oral q AM   pantoprazole  40 mg Oral q AM   polyethylene glycol  17 g Oral Daily   polyvinyl alcohol  1 drop Both Eyes BID   potassium chloride SA  20 mEq Oral q AM   primidone  50 mg Oral q AM   QUEtiapine  100 mg Oral QHS   senna-docusate  2 tablet Oral QHS   sertraline  50 mg Oral Daily   sodium  chloride flush  10-40 mL Intracatheter Q12H   sodium chloride flush  3 mL Intravenous Q12H   sodium chloride flush  3 mL Intravenous Q12H   sodium chloride  1 g Oral BID   triamcinolone ointment   Topical TID   valbenazine  40 mg Oral q AM   Continuous Infusions:  sodium chloride     sodium chloride 150 mL/hr at 10/06/20 0334   azithromycin 500 mg (10/06/20 0854)   ceFEPime (MAXIPIME) IV 2 g (10/06/20 0526)   fluconazole (DIFLUCAN) IV 100 mL/hr at 10/05/20 1832   norepinephrine (LEVOPHED) Adult infusion Stopped (10/05/20 1346)   vancomycin 1,000 mg (10/06/20 0334)   PRN Meds:.sodium chloride, acetaminophen **OR** acetaminophen, albuterol, bisacodyl, ondansetron **OR** ondansetron (ZOFRAN) IV, polyethylene glycol, sodium chloride, sodium chloride flush, sodium chloride flush, traZODone    Anti-infectives (From admission, onward)    Start     Dose/Rate Route Frequency Ordered Stop   10/06/20 0400  vancomycin (VANCOCIN) IVPB 1000 mg/200 mL premix        1,000 mg 200 mL/hr over 60 Minutes Intravenous Every 12 hours 10/05/20 1355     10/05/20 1800  fluconazole (DIFLUCAN) IVPB 200 mg        200 mg 100  mL/hr over 60 Minutes Intravenous Every 24 hours 10/05/20 1707     10/05/20 1400  vancomycin (VANCOREADY) IVPB 1500 mg/300 mL        1,500 mg 150 mL/hr over 120 Minutes Intravenous  Once 10/05/20 1351 10/05/20 1613   10/05/20 0830  azithromycin (ZITHROMAX) 500 mg in sodium chloride 0.9 % 250 mL IVPB  Status:  Discontinued        500 mg 250 mL/hr over 60 Minutes Intravenous Every 24 hours 10/05/20 0821 10/05/20 0823   10/05/20 0830  ceFEPIme (MAXIPIME) 2 g in sodium chloride 0.9 % 100 mL IVPB        2 g 200 mL/hr over 30 Minutes Intravenous Every 8 hours 10/05/20 0821 10/10/20 0559   10/05/20 0815  cefTRIAXone (ROCEPHIN) 1 g in sodium chloride 0.9 % 100 mL IVPB  Status:  Discontinued        1 g 200 mL/hr over 30 Minutes Intravenous Every 24 hours 10/05/20 0809 10/05/20 0815   10/05/20 0815  azithromycin (ZITHROMAX) 500 mg in sodium chloride 0.9 % 250 mL IVPB        500 mg 250 mL/hr over 60 Minutes Intravenous Every 24 hours 10/05/20 0809     10/05/20 0700  piperacillin-tazobactam (ZOSYN) IVPB 3.375 g        3.375 g 100 mL/hr over 30 Minutes Intravenous  Once 10/05/20 0655 10/05/20 0744         Objective:   Vitals:   10/06/20 0500 10/06/20 0600 10/06/20 0700 10/06/20 0800  BP: (!) 110/50 (!) 91/42 (!) 97/48   Pulse: 91 94 94 90  Resp: (!) 21 19 (!) 21 20  Temp:    97.7 F (36.5 C)  TempSrc:    Axillary  SpO2: 100% 90% 95% 100%  Weight: 74.5 kg     Height:        Wt Readings from Last 3 Encounters:  10/06/20 74.5 kg  09/23/20 83.7 kg  09/06/20 83.7 kg     Intake/Output Summary (Last 24 hours) at 10/06/2020 1112 Last data filed at 10/06/2020 0700 Gross per 24 hour  Intake 4478.41 ml  Output 400 ml  Net 4078.41 ml     Physical Exam  Gen:- Awake , sleepy, no conversational  dyspnea HEENT:- [place].AT, No sclera icterus, left frontal area with neurostimulator Neck-Supple Neck,No JVD, neurostimulator wires palpated in the neck area Lungs-diminished breath sounds with  scattered rhonchi especially on the right  CV- S1, S2 normal, regular, neurostimulator palpated over both subclavian areas Abd-  +ve B.Sounds, Abd Soft, No tenderness,    ExtremitNo  edema, pedal pulses present  Psych-affect is appropriate, oriented x3 Neuro-generalized weakness ,no new focal deficits, no tremors -Skin/GU-extensive cutaneous candidiasis/candidal intertrigo please see photos in epic MSK-right femoral catheter site with slight oozing   Data Review:   Micro Results Recent Results (from the past 240 hour(s))  Resp Panel by RT-PCR (Flu A&B, Covid) Nasopharyngeal Swab     Status: None   Collection Time: 10/05/20  3:45 AM   Specimen: Nasopharyngeal Swab; Nasopharyngeal(NP) swabs in vial transport medium  Result Value Ref Range Status   SARS Coronavirus 2 by RT PCR NEGATIVE NEGATIVE Final    Comment: (NOTE) SARS-CoV-2 target nucleic acids are NOT DETECTED.  The SARS-CoV-2 RNA is generally detectable in upper respiratory specimens during the acute phase of infection. The lowest concentration of SARS-CoV-2 viral copies this assay can detect is 138 copies/mL. A negative result does not preclude SARS-Cov-2 infection and should not be used as the sole basis for treatment or other patient management decisions. A negative result may occur with  improper specimen collection/handling, submission of specimen other than nasopharyngeal swab, presence of viral mutation(s) within the areas targeted by this assay, and inadequate number of viral copies(<138 copies/mL). A negative result must be combined with clinical observations, patient history, and epidemiological information. The expected result is Negative.  Fact Sheet for Patients:  EntrepreneurPulse.com.au  Fact Sheet for Healthcare Providers:  IncredibleEmployment.be  This test is no t yet approved or cleared by the [place] FDA and  has been authorized for detection and/or diagnosis  of SARS-CoV-2 by FDA under an Emergency Use Authorization (EUA). This EUA will remain  in effect (meaning this test can be used) for the duration of the COVID-19 declaration under Section 564(b)(1) of the Act, 21 U.S.C.section 360bbb-3(b)(1), unless the authorization is terminated  or revoked sooner.       Influenza A by PCR NEGATIVE NEGATIVE Final   Influenza B by PCR NEGATIVE NEGATIVE Final    Comment: (NOTE) The Xpert Xpress SARS-CoV-2/FLU/RSV plus assay is intended as an aid in the diagnosis of influenza from Nasopharyngeal swab specimens and should not be used as a sole basis for treatment. Nasal washings and aspirates are unacceptable for Xpert Xpress SARS-CoV-2/FLU/RSV testing.  Fact Sheet for Patients: EntrepreneurPulse.com.au  Fact Sheet for Healthcare Providers: IncredibleEmployment.be  This test is not yet approved or cleared by the [place] FDA and has been authorized for detection and/or diagnosis of SARS-CoV-2 by FDA under an Emergency Use Authorization (EUA). This EUA will remain in effect (meaning this test can be used) for the duration of the COVID-19 declaration under Section 564(b)(1) of the Act, 21 U.S.C. section 360bbb-3(b)(1), unless the authorization is terminated or revoked.  Performed at [facility], [address] [place], [place] [address]   Blood culture (routine single)     Status: None (Preliminary result)   Collection Time: 10/05/20  4:24 AM   Specimen: BLOOD LEFT ARM  Result Value Ref Range Status   Specimen Description BLOOD LEFT ARM  Final   Special Requests   Final    BOTTLES DRAWN AEROBIC AND ANAEROBIC Blood Culture adequate volume   Culture   Final  NO GROWTH < 12 HOURS Performed at [facility], [address] [place], [place] [address]    Report Status PENDING  Incomplete  Culture, blood (Routine X 2) w Reflex to ID Panel     Status: None (Preliminary result)   Collection Time: 10/05/20   8:44 AM   Specimen: Right Antecubital; Blood  Result Value Ref Range Status   Specimen Description   Final    RIGHT ANTECUBITAL BOTTLES DRAWN AEROBIC AND ANAEROBIC   Special Requests   Final    Blood Culture adequate volume Performed at [facility], [address] [place], [place] [address]    Culture PENDING  Incomplete   Report Status PENDING  Incomplete  MRSA Next Gen by PCR, Nasal     Status: Abnormal   Collection Time: 10/05/20  3:50 PM   Specimen: Nasal Mucosa; Nasal Swab  Result Value Ref Range Status   MRSA by PCR Next Gen DETECTED (A) NOT DETECTED Final    Comment: CRITICAL RESULT CALLED TO, READ BACK BY AND VERIFIED WITH: MAYNARD,R 2058 10/05/2020 COLEMAN,R (NOTE) The GeneXpert MRSA Assay (FDA approved for NASAL specimens only), is one component of a comprehensive MRSA colonization surveillance program. It is not intended to diagnose MRSA infection nor to guide or monitor treatment for MRSA infections. Test performance is not FDA approved in patients less than 58 years old. Performed at [facility], [address] [place], [place] [address]     Radiology Reports CT Head Wo Contrast  Result Date: 09/23/2020 CLINICAL DATA:  Pt is from [place] [facility] and fell out of wheelchair, striking her head on an end table; pt has hematoma to right side of headDM, hx of seizures EXAM: CT HEAD WITHOUT CONTRAST CT CERVICAL SPINE WITHOUT CONTRAST TECHNIQUE: Multidetector CT imaging of the head and cervical spine was performed following the standard protocol without intravenous contrast. Multiplanar CT image reconstructions of the cervical spine were also generated. COMPARISON:  09/10/2019. FINDINGS: CT HEAD FINDINGS Brain: No evidence of acute infarction, hemorrhage, hydrocephalus, extra-axial collection or mass lesion/mass effect. Stable deep brain stimulators extend from the superior frontal bones to the thalami. Vascular: No hyperdense vessel or unexpected calcification. Skull: Normal.  Negative for fracture or focal lesion. Sinuses/Orbits: Globes and orbits are unremarkable. Sinuses are clear. Other: Small right frontal scalp contusion. CT CERVICAL SPINE FINDINGS Alignment: Slight reversal the normal cervical lordosis, apex at C5-C6. Mild degenerative anterolisthesis of C3 and C4. Skull base and vertebrae: No acute fracture. No primary bone lesion or focal pathologic process. Soft tissues and spinal canal: No prevertebral fluid or swelling. No visible canal hematoma. Disc levels: Moderate loss of disc height at C4-C5, C5-C6, C6-C7 and C7-T1. Endplate spurring and mild disc bulging noted at these levels. There are facet degenerative changes greatest on the right at C3-C4. No convincing disc herniation. Upper chest: No acute findings. Other: None. IMPRESSION: HEAD CT 1. No acute intracranial abnormalities.  No skull fracture. 2. Small right frontal scalp contusion. CERVICAL CT 1. No fracture or acute finding. Electronically Signed   By: Lajean Manes M.D.   On: 09/23/2020 15:01   CT Angio Chest Pulmonary Embolism (PE) W or WO Contrast  Result Date: 10/05/2020 CLINICAL DATA:  Shortness of breath. Diagnosed with pneumonia last week. EXAM: CT ANGIOGRAPHY CHEST WITH CONTRAST TECHNIQUE: Multidetector CT imaging of the chest was performed using the standard protocol during bolus administration of intravenous contrast. Multiplanar CT image reconstructions and MIPs were obtained to evaluate the vascular anatomy. CONTRAST:  157mL OMNIPAQUE  IOHEXOL 350 MG/ML SOLN COMPARISON:  Chest x-ray 10/05/2020, CT chest abdomen pelvis 09/02/2020, chest x-ray 09/10/2019 FINDINGS: Cardiovascular: Satisfactory opacification of the pulmonary arteries to the segmental level. No evidence of pulmonary embolism. Normal heart size. No significant pericardial effusion. The thoracic aorta is normal in caliber. Mild atherosclerotic plaque of the thoracic aorta. No coronary artery calcifications. Mediastinum/Nodes: Multiple  prominent but not enlarged right hilar lymph nodes there are difficult to measure due to timing of intravenous contrast. No enlarged mediastinal, hilar, or axillary lymph nodes. Subcentimeter thyroid gland hypodense nodules. Patulous esophagus. Trace debris within the trachea. Otherwise trachea and esophagus demonstrate no significant findings. Lungs/Pleura: Bilateral lower lobe heterogeneous consolidations. Interval increase in patchy nodular-like airspace opacities within the dependent right lower lobe. Persistent but improved right lower lobe heterogeneous consolidation compared to CT chest 09/02/2020. Interval increase in heterogeneous left lower lobe consolidation involving the majority of the lobe. Upper Abdomen: No acute abnormality. Musculoskeletal: Bilateral chest wall neural stimulators with leads coursing cranially and tips collimated off view. No suspicious lytic or blastic osseous lesions. No acute displaced fracture. Multilevel degenerative changes of the spine. Review of the MIP images confirms the above findings. IMPRESSION: 1. Interval worsening (compared to CT chest 09/02/2020) multifocal pneumonia/aspiration pneumonia with underlying malignancy not excluded. 2. Nodular density noted on chest x-ray correlates nodular-like airspace densities within the dependent right upper lobe. 3. No pulmonary embolus. Electronically Signed   By: Iven Finn M.D.   On: 10/05/2020 06:11   CT Cervical Spine Wo Contrast  Result Date: 09/23/2020 CLINICAL DATA:  Pt is from [place] [facility] and fell out of wheelchair, striking her head on an end table; pt has hematoma to right side of headDM, hx of seizures EXAM: CT HEAD WITHOUT CONTRAST CT CERVICAL SPINE WITHOUT CONTRAST TECHNIQUE: Multidetector CT imaging of the head and cervical spine was performed following the standard protocol without intravenous contrast. Multiplanar CT image reconstructions of the cervical spine were also generated. COMPARISON:  09/10/2019.  FINDINGS: CT HEAD FINDINGS Brain: No evidence of acute infarction, hemorrhage, hydrocephalus, extra-axial collection or mass lesion/mass effect. Stable deep brain stimulators extend from the superior frontal bones to the thalami. Vascular: No hyperdense vessel or unexpected calcification. Skull: Normal. Negative for fracture or focal lesion. Sinuses/Orbits: Globes and orbits are unremarkable. Sinuses are clear. Other: Small right frontal scalp contusion. CT CERVICAL SPINE FINDINGS Alignment: Slight reversal the normal cervical lordosis, apex at C5-C6. Mild degenerative anterolisthesis of C3 and C4. Skull base and vertebrae: No acute fracture. No primary bone lesion or focal pathologic process. Soft tissues and spinal canal: No prevertebral fluid or swelling. No visible canal hematoma. Disc levels: Moderate loss of disc height at C4-C5, C5-C6, C6-C7 and C7-T1. Endplate spurring and mild disc bulging noted at these levels. There are facet degenerative changes greatest on the right at C3-C4. No convincing disc herniation. Upper chest: No acute findings. Other: None. IMPRESSION: HEAD CT 1. No acute intracranial abnormalities.  No skull fracture. 2. Small right frontal scalp contusion. CERVICAL CT 1. No fracture or acute finding. Electronically Signed   By: Lajean Manes M.D.   On: 09/23/2020 15:01   DG CHEST PORT 1 VIEW  Result Date: 10/06/2020 CLINICAL DATA:  70 year old female with shortness of breath. Hypertension. EXAM: PORTABLE CHEST 1 VIEW COMPARISON:  Chest CTA yesterday, and earlier. FINDINGS: Portable AP semi upright view at 0620 hours. Bilateral chest generator devices related to deep brain stimuli. Continued low lung volumes. Stable cardiac size and mediastinal contours. No pneumothorax or pulmonary  edema. No pleural effusion. Left greater than right lower lobe consolidation better demonstrated by CT yesterday. Patchy asymmetric peribronchial opacity in the right upper lobe appears stable. No areas of  worsening ventilation. Visible bowel-gas pattern within normal limits, mild gas in the stomach. Stable cholecystectomy clips. IMPRESSION: 1. Stable ventilation from yesterday where left greater than right lower lobe collapse or consolidation was better demonstrated by CT. Right upper lobe bronchopneumonia. 2. No new cardiopulmonary abnormality. Electronically Signed   By: Genevie Ann M.D.   On: 10/06/2020 06:35   DG Chest Port 1 View  Result Date: 10/05/2020 CLINICAL DATA:  Question sepsis.  Pneumonia diagnosis last week. EXAM: PORTABLE CHEST 1 VIEW COMPARISON:  Chest x-ray 09/04/2020, CT chest 09/02/2020 FINDINGS: Bilateral chest wall neural stimulators noted with leads extending cranially and tips collimated off view. The heart size and mediastinal contours are unchanged. Residual nodular-like 1.5 cm density at the right base. Associated linear atelectasis versus scarring. Poorly visualized left base due to patient rotation. No pulmonary edema. No pleural effusion. No pneumothorax. No acute osseous abnormality. IMPRESSION: 1. Residual nodular-like 1.5 cm density at the right base. Recommend CT chest with intravenous contrast for further evaluation. 2. Limited evaluation of the left base due to patient rotation. This can be further evaluated on CT chest with intravenous contrast. Electronically Signed   By: Iven Finn M.D.   On: 10/05/2020 04:16     CBC Recent Labs  Lab 10/05/20 0346 10/06/20 0340 10/06/20 0444  WBC 5.8 5.3  --   HGB 10.0* 7.6* 7.0*  HCT 30.9* 24.2* 22.0*  PLT 133* 88*  --   MCV 112.8* 114.7*  --   MCH 36.5* 36.0*  --   MCHC 32.4 31.4  --   RDW 15.5 15.2  --   LYMPHSABS 1.9  --   --   MONOABS 0.4  --   --   EOSABS 0.0  --   --   BASOSABS 0.0  --   --     Chemistries  Recent Labs  Lab 10/05/20 0346 10/06/20 0340  NA 134* 138  K 3.7 4.2  CL 97* 104  CO2 29 28  GLUCOSE 87 217*  BUN 10 7*  CREATININE 0.42* 0.43*  CALCIUM 8.7* 8.0*  AST 16  --   ALT 8  --    ALKPHOS 47  --   BILITOT 0.5  --    ------------------------------------------------------------------------------------------------------------------ No results for input(s): CHOL, HDL, LDLCALC, TRIG, CHOLHDL, LDLDIRECT in the last 72 hours.  Lab Results  Component Value Date   HGBA1C 5.7 (H) 09/04/2020   ------------------------------------------------------------------------------------------------------------------ No results for input(s): TSH, T4TOTAL, T3FREE, THYROIDAB in the last 72 hours.  Invalid input(s): FREET3 ------------------------------------------------------------------------------------------------------------------ Recent Labs    10/06/20 0340  VITAMINB12 675  FOLATE 8.0  FERRITIN 214  TIBC 185*  IRON 20*    Coagulation profile Recent Labs  Lab 10/05/20 0346  INR 1.2    No results for input(s): DDIMER in the last 72 hours.  Cardiac Enzymes No results for input(s): CKMB, TROPONINI, MYOGLOBIN in the last 168 hours.  Invalid input(s): CK ------------------------------------------------------------------------------------------------------------------    Component Value Date/Time   BNP 81.0 08/27/2019 1156     Daysy Santini M.D on 10/06/2020 at 11:12 AM  Go to [email or web address] - for contact info  [facility] - Office  [phone]

## 2020-10-06 NOTE — Progress Notes (Signed)
Patient noted to be alert and oriented x 3. MD and RN present at patient's bedside. Patient made aware her HGB is low (7.0), and educated about blood products and blood transfusion. Pt verbally consents to receive blood products including PRBC with MD and RN present.

## 2020-10-07 LAB — CBC
HCT: 29.7 % — ABNORMAL LOW (ref 36.0–46.0)
Hemoglobin: 9.7 g/dL — ABNORMAL LOW (ref 12.0–15.0)
MCH: 34.8 pg — ABNORMAL HIGH (ref 26.0–34.0)
MCHC: 32.7 g/dL (ref 30.0–36.0)
MCV: 106.5 fL — ABNORMAL HIGH (ref 80.0–100.0)
Platelets: 107 10*3/uL — ABNORMAL LOW (ref 150–400)
RBC: 2.79 MIL/uL — ABNORMAL LOW (ref 3.87–5.11)
RDW: 19.9 % — ABNORMAL HIGH (ref 11.5–15.5)
WBC: 8.8 10*3/uL (ref 4.0–10.5)
nRBC: 0.5 % — ABNORMAL HIGH (ref 0.0–0.2)

## 2020-10-07 LAB — BASIC METABOLIC PANEL
Anion gap: 6 (ref 5–15)
BUN: 5 mg/dL — ABNORMAL LOW (ref 8–23)
CO2: 31 mmol/L (ref 22–32)
Calcium: 8.1 mg/dL — ABNORMAL LOW (ref 8.9–10.3)
Chloride: 98 mmol/L (ref 98–111)
Creatinine, Ser: 0.37 mg/dL — ABNORMAL LOW (ref 0.44–1.00)
GFR, Estimated: >60 mL/min (ref >60)
Glucose, Bld: 271 mg/dL — ABNORMAL HIGH (ref 70–99)
Potassium: 3.7 mmol/L (ref 3.5–5.1)
Sodium: 135 mmol/L (ref 135–145)

## 2020-10-07 LAB — GLUCOSE, CAPILLARY
Glucose-Capillary: 224 mg/dL — ABNORMAL HIGH (ref 70–99)
Glucose-Capillary: 183 mg/dL — ABNORMAL HIGH (ref 70–99)
Glucose-Capillary: 244 mg/dL — ABNORMAL HIGH (ref 70–99)
Glucose-Capillary: 202 mg/dL — ABNORMAL HIGH (ref 70–99)

## 2020-10-07 LAB — URINE CULTURE

## 2020-10-07 LAB — LEGIONELLA PNEUMOPHILA SEROGP 1 UR AG: L. pneumophila Serogp 1 Ur Ag: NEGATIVE

## 2020-10-07 MED ORDER — INSULIN GLARGINE 100 UNIT/ML ~~LOC~~ SOLN
18.0000 [IU] | Freq: Every day | SUBCUTANEOUS | Status: DC
  Administered 2020-10-07: 18 [IU] via SUBCUTANEOUS
  Filled 2020-10-07 (×2): qty 0.18

## 2020-10-07 MED ORDER — INSULIN ASPART 100 UNIT/ML IJ SOLN
3.0000 [IU] | Freq: Three times a day (TID) | INTRAMUSCULAR | Status: DC
  Administered 2020-10-08: 3 [IU] via SUBCUTANEOUS

## 2020-10-07 MED ORDER — METOPROLOL TARTRATE 25 MG PO TABS
12.5000 mg | ORAL_TABLET | Freq: Two times a day (BID) | ORAL | Status: DC
  Administered 2020-10-07 – 2020-10-08 (×2): 12.5 mg via ORAL
  Filled 2020-10-07 (×2): qty 1

## 2020-10-07 NOTE — Progress Notes (Signed)
Patient Demographics:    [name], is a 70 y.o. female, DOB - 12-15-1950, QIW:[id number]  Admit date - 10/05/2020   Admitting Physician Elizabeth Krauser Denton Brick, MD  Outpatient Primary MD for the patient is Elizabeth Richard, MD  LOS - 1   Chief Complaint  Patient presents with   Shortness of Breath        Subjective:    [name] today has no fevers, no emesis,  No chest pain, More awake, more coherent, more talkative, eating and drinking better  Assessment  & Plan :    Principal Problem:   Severe sepsis with Hypoxia and Hypotension- due to PNA Active Problems:   CAP (community acquired pneumonia)   Aspiration pneumonitis ([name])   Acute on chronic respiratory failure with hypoxia ([place])   Intertrigo of genitocrural region due to Candida/candidal intertrigo/severe cutaneous candidiasis   Sepsis due to PNA   Tremor, essential   Essential hypertension   Hypothyroidism   S/P deep brain stimulator placement   Obstructive sleep apnea   Type II diabetes mellitus with manifestations ([place])   Depression with somatization   MDD (major depressive disorder), recurrent severe, without psychosis ([place])   COPD with acute exacerbation ([name])   Pressure injury of skin   PNA (pneumonia)   A/p 1)Severe Sepsis Secondary to Pneumonia--- POA -On admission patient had tachycardia, tachypnea, hypotension and worsening hypoxia becoming BiPAP dependent -Lactic acid is not elevated , WBC not elevated -Continue vancomycin, azithromycin and cefepime pending culture data -Continue IV fluids -May use Levophed for pressure support if hypotension persist -Cough and dyspnea persist -Hypoxia improving weaning off oxygen    2)Multifocal Pneumonia--?HCAP, ???  Aspiration related -CTA chest from 10/05/2020 shows Interval worsening (compared to CT chest 09/02/2020) multifocal pneumonia/aspiration pneumonia with underlying  malignancy not excluded. -Chest x-ray from 10/06/2020 stable with right upper lobe bronchopneumonia -Antibiotics as above #1   3)Acute on chronic Hypoxic Respiratory Failure secondary to #1 and #2 above--- -Patient remains hypoxic despite high flow nasal cannula, required BiPAP for several hours for respiratory support -Antibiotics as above #1 Hypoxia and overall respiratory status continues to improve, mentation also continues to improve   4)HTN--okay to give metoprolol, continue to hold Cardizem due to sepsis related soft BP   5)Chronic Tremors--status post prior deep brain stimulator -Patient sees [name] (Neurology) -Continue on primidone   6)Bipolar Disorder---  continue Depakote, Zoloft, will decrease Seroquel to 25 mg nightly due to excessive sleepiness   7)DM2-A1c is 5.7 reflecting excellent diabetic control PTA -Hyperglycemia or worsening increase Lantus to 18 units Use Novolog/Humalog Sliding scale insulin with Accu-Cheks/Fingersticks as ordered    8) acute on chronic chronic anemia--elevated MCV and MCH noted -Hemoglobin is up to 9.7 from 7.6 after transfusion of 1 unit of PRBC on 10/06/2020 - baseline appears to be around 9 usually--- suspect some component of hemodilution due to IV fluids for sepsis protocol -B12 and folate are not low, ferritin WNL, serum iron is 20 ,TIBC 185 -Iron work-up ordered -No evidence of ongoing bleeding -Patient states she is not a Jehovah Witness, RN [name] and myself spoke with patient about possible blood transfusion--- patient is coherent, states she has No objection to blood transfusion if needed   9) hypothyroidism--continue levothyroxine  10) social/ethics--patient has a DNR form, patient also has a MOST form, ---patient's daughter [name] is her only surviving child----[phone]------ they have not seen each other in about 5 years and they rarely speak.  The relationship is not very close -Patient has history of mental  illness as well as prior history of substance abuse   11) candidal intertrigo/severe cutaneous candidiasis--- rather extensive candidiasis, see photos in epic -- topical Mycolog ointment and IV Diflucan as ordered  12) thrombocytopenia----monitor closely no active bleeding noted, platelet count currently close to prior baseline   Disposition/Need for in-Hospital Stay- patient unable to be discharged at this time due to -severe sepsis with hypoxic respiratory failure and hypotension requiring IV fluids and IV antibiotics    Status is: Inpatient   Remains inpatient appropriate because: Please see disposition above   Dispo: The patient is from: SNF              Anticipated d/c is to: SNF              Anticipated d/c date is: 3 days              Patient currently is not medically stable to d/c. Barriers: Not Clinically Stable-  Code Status :  -  Code Status: DNR   Family Communication:   (patient is alert, awake and coherent)  -daughter [name] is her only surviving child----[phone]------ they have not seen each other in about 5 years and they rarely speak. Consults  :  na  DVT Prophylaxis  :   - SCDs  enoxaparin (LOVENOX) injection 40 mg Start: 10/05/20 0830 SCDs Start: 10/05/20 0818 Place TED hose Start: 10/05/20 0818    Lab Results  Component Value Date   PLT 107 (L) 10/07/2020    Inpatient Medications  Scheduled Meds:  sodium chloride   Intravenous Once   aspirin  81 mg Oral q AM   baclofen  5 mg Oral QHS   Chlorhexidine Gluconate Cloth  6 each Topical Daily   divalproex  500 mg Oral TID   enoxaparin (LOVENOX) injection  40 mg Subcutaneous Q24H   feeding supplement (GLUCERNA 1.2 CAL)  237 mL Oral BID BM   fluticasone furoate-vilanterol  1 puff Inhalation Daily   gabapentin  300 mg Oral q AM   gabapentin  600 mg Oral QHS   insulin aspart  0-5 Units Subcutaneous QHS   insulin aspart  0-9 Units Subcutaneous TID WC   insulin aspart  4 Units Subcutaneous TID WC    insulin glargine  15 Units Subcutaneous QHS   ipratropium-albuterol  3 mL Nebulization Q6H   levothyroxine  50 mcg Oral QAC breakfast   lubiprostone  24 mcg Oral BID WC   methylPREDNISolone (SOLU-MEDROL) injection  40 mg Intravenous Q12H   mupirocin ointment  1 application Nasal BID   Muscle Rub  1 application Topical BID   nystatin ointment   Topical TID   omega-3 acid ethyl esters  1 g Oral q AM   pantoprazole  40 mg Oral q AM   polyethylene glycol  17 g Oral Daily   polyvinyl alcohol  1 drop Both Eyes BID   potassium chloride SA  20 mEq Oral q AM   primidone  50 mg Oral q AM   QUEtiapine  25 mg Oral QHS   senna-docusate  2 tablet Oral QHS   sertraline  50 mg Oral Daily   sodium chloride flush  10-40 mL Intracatheter Q12H  sodium chloride flush  3 mL Intravenous Q12H   sodium chloride flush  3 mL Intravenous Q12H   sodium chloride  1 g Oral BID   triamcinolone ointment   Topical TID   valbenazine  40 mg Oral q AM   Continuous Infusions:  sodium chloride     sodium chloride 150 mL/hr at 10/07/20 1301   azithromycin 500 mg (10/07/20 0946)   ceFEPime (MAXIPIME) IV 2 g (10/07/20 1308)   fluconazole (DIFLUCAN) IV 200 mg (10/07/20 1737)   norepinephrine (LEVOPHED) Adult infusion Stopped (10/05/20 1346)   vancomycin 1,000 mg (10/07/20 1611)   PRN Meds:.sodium chloride, acetaminophen **OR** acetaminophen, albuterol, bisacodyl, ondansetron **OR** ondansetron (ZOFRAN) IV, polyethylene glycol, sodium chloride, sodium chloride flush, sodium chloride flush, traZODone    Anti-infectives (From admission, onward)    Start     Dose/Rate Route Frequency Ordered Stop   10/06/20 0400  vancomycin (VANCOCIN) IVPB 1000 mg/200 mL premix        1,000 mg 200 mL/hr over 60 Minutes Intravenous Every 12 hours 10/05/20 1355     10/05/20 1800  fluconazole (DIFLUCAN) IVPB 200 mg        200 mg 100 mL/hr over 60 Minutes Intravenous Every 24 hours 10/05/20 1707     10/05/20 1400  vancomycin  (VANCOREADY) IVPB 1500 mg/300 mL        1,500 mg 150 mL/hr over 120 Minutes Intravenous  Once 10/05/20 1351 10/05/20 1613   10/05/20 0830  azithromycin (ZITHROMAX) 500 mg in sodium chloride 0.9 % 250 mL IVPB  Status:  Discontinued        500 mg 250 mL/hr over 60 Minutes Intravenous Every 24 hours 10/05/20 0821 10/05/20 0823   10/05/20 0830  ceFEPIme (MAXIPIME) 2 g in sodium chloride 0.9 % 100 mL IVPB        2 g 200 mL/hr over 30 Minutes Intravenous Every 8 hours 10/05/20 0821 10/10/20 0559   10/05/20 0815  cefTRIAXone (ROCEPHIN) 1 g in sodium chloride 0.9 % 100 mL IVPB  Status:  Discontinued        1 g 200 mL/hr over 30 Minutes Intravenous Every 24 hours 10/05/20 0809 10/05/20 0815   10/05/20 0815  azithromycin (ZITHROMAX) 500 mg in sodium chloride 0.9 % 250 mL IVPB        500 mg 250 mL/hr over 60 Minutes Intravenous Every 24 hours 10/05/20 0809     10/05/20 0700  piperacillin-tazobactam (ZOSYN) IVPB 3.375 g        3.375 g 100 mL/hr over 30 Minutes Intravenous  Once 10/05/20 0655 10/05/20 0744         Objective:   Vitals:   10/07/20 0224 10/07/20 0500 10/07/20 0722 10/07/20 1345  BP: 118/72 122/60    Pulse: 96 90    Resp: 16 16    Temp: 98.4 F (36.9 C) 99.4 F (37.4 C)    TempSrc: Oral Oral    SpO2: 96% 96% 98% 98%  Weight:      Height:        Wt Readings from Last 3 Encounters:  10/06/20 74.5 kg  09/23/20 83.7 kg  09/06/20 83.7 kg     Intake/Output Summary (Last 24 hours) at 10/07/2020 1821 Last data filed at 10/07/2020 1345 Gross per 24 hour  Intake 2367.25 ml  Output 2900 ml  Net -532.75 ml     Physical Exam  Gen:- Awake , sleepy, no conversational dyspnea HEENT:- [place].AT, No sclera icterus, left frontal area with neurostimulator Nose- [place]  3L/min Neck-Supple Neck,No JVD, neurostimulator wires palpated in the neck area Lungs-diminished breath sounds with scattered rhonchi especially on the right  CV- S1, S2 normal, regular, neurostimulator palpated over both  subclavian areas Abd-  +ve B.Sounds, Abd Soft, No tenderness,    ExtremitNo  edema, pedal pulses present  Psych-affect is appropriate, oriented x3 Neuro-generalized weakness ,no new focal deficits, no tremors -Skin/GU-extensive cutaneous candidiasis/candidal intertrigo please see photos in epic MSK-right femoral catheter site hemostatic   Data Review:   Micro Results Recent Results (from the past 240 hour(s))  Resp Panel by RT-PCR (Flu A&B, Covid) Nasopharyngeal Swab     Status: None   Collection Time: 10/05/20  3:45 AM   Specimen: Nasopharyngeal Swab; Nasopharyngeal(NP) swabs in vial transport medium  Result Value Ref Range Status   SARS Coronavirus 2 by RT PCR NEGATIVE NEGATIVE Final    Comment: (NOTE) SARS-CoV-2 target nucleic acids are NOT DETECTED.  The SARS-CoV-2 RNA is generally detectable in upper respiratory specimens during the acute phase of infection. The lowest concentration of SARS-CoV-2 viral copies this assay can detect is 138 copies/mL. A negative result does not preclude SARS-Cov-2 infection and should not be used as the sole basis for treatment or other patient management decisions. A negative result may occur with  improper specimen collection/handling, submission of specimen other than nasopharyngeal swab, presence of viral mutation(s) within the areas targeted by this assay, and inadequate number of viral copies(<138 copies/mL). A negative result must be combined with clinical observations, patient history, and epidemiological information. The expected result is Negative.  Fact Sheet for Patients:  EntrepreneurPulse.com.au  Fact Sheet for Healthcare Providers:  IncredibleEmployment.be  This test is no t yet approved or cleared by the [place] FDA and  has been authorized for detection and/or diagnosis of SARS-CoV-2 by FDA under an Emergency Use Authorization (EUA). This EUA will remain  in effect (meaning this  test can be used) for the duration of the COVID-19 declaration under Section 564(b)(1) of the Act, 21 U.S.C.section 360bbb-3(b)(1), unless the authorization is terminated  or revoked sooner.       Influenza A by PCR NEGATIVE NEGATIVE Final   Influenza B by PCR NEGATIVE NEGATIVE Final    Comment: (NOTE) The Xpert Xpress SARS-CoV-2/FLU/RSV plus assay is intended as an aid in the diagnosis of influenza from Nasopharyngeal swab specimens and should not be used as a sole basis for treatment. Nasal washings and aspirates are unacceptable for Xpert Xpress SARS-CoV-2/FLU/RSV testing.  Fact Sheet for Patients: EntrepreneurPulse.com.au  Fact Sheet for Healthcare Providers: IncredibleEmployment.be  This test is not yet approved or cleared by the [place] FDA and has been authorized for detection and/or diagnosis of SARS-CoV-2 by FDA under an Emergency Use Authorization (EUA). This EUA will remain in effect (meaning this test can be used) for the duration of the COVID-19 declaration under Section 564(b)(1) of the Act, 21 U.S.C. section 360bbb-3(b)(1), unless the authorization is terminated or revoked.  Performed at [facility], [address], [place], [place] [address]   Blood culture (routine single)     Status: None (Preliminary result)   Collection Time: 10/05/20  4:24 AM   Specimen: BLOOD LEFT ARM  Result Value Ref Range Status   Specimen Description BLOOD LEFT ARM  Final   Special Requests   Final    BOTTLES DRAWN AEROBIC AND ANAEROBIC Blood Culture adequate volume   Culture   Final    NO GROWTH 1 DAY Performed at [facility], [address]  [place], [place] [address]    Report Status PENDING  Incomplete  Culture, blood (Routine X 2) w Reflex to ID Panel     Status: None (Preliminary result)   Collection Time: 10/05/20  8:44 AM   Specimen: Right Antecubital; Blood  Result Value Ref Range Status   Specimen Description   Final    RIGHT  ANTECUBITAL BOTTLES DRAWN AEROBIC AND ANAEROBIC   Special Requests Blood Culture adequate volume  Final   Culture   Final    NO GROWTH 1 DAY Performed at [facility], [address] [place], [place] [address]    Report Status PENDING  Incomplete  Urine culture     Status: Abnormal   Collection Time: 10/05/20  9:53 AM   Specimen: Urine, Catheterized  Result Value Ref Range Status   Specimen Description   Final    URINE, CATHETERIZED Performed at [facility], [address], [place], [place] [address]    Special Requests   Final    NONE Performed at [facility], [address], [place], [place] [address]    Culture MULTIPLE SPECIES PRESENT, SUGGEST RECOLLECTION (A)  Final   Report Status 10/07/2020 FINAL  Final  MRSA Next Gen by PCR, Nasal     Status: Abnormal   Collection Time: 10/05/20  3:50 PM   Specimen: Nasal Mucosa; Nasal Swab  Result Value Ref Range Status   MRSA by PCR Next Gen DETECTED (A) NOT DETECTED Final    Comment: CRITICAL RESULT CALLED TO, READ BACK BY AND VERIFIED WITH: MAYNARD,R 2058 10/05/2020 COLEMAN,R (NOTE) The GeneXpert MRSA Assay (FDA approved for NASAL specimens only), is one component of a comprehensive MRSA colonization surveillance program. It is not intended to diagnose MRSA infection nor to guide or monitor treatment for MRSA infections. Test performance is not FDA approved in patients less than 63 years old. Performed at [facility], [address], [place], [place] [address]     Radiology Reports CT Head Wo Contrast  Result Date: 09/23/2020 CLINICAL DATA:  Pt is from [place] center and fell out of wheelchair, striking her head on an end table; pt has hematoma to right side of headDM, hx of seizures EXAM: CT HEAD WITHOUT CONTRAST CT CERVICAL SPINE WITHOUT CONTRAST TECHNIQUE: Multidetector CT imaging of the head and cervical spine was performed following the standard protocol without intravenous contrast. Multiplanar CT image reconstructions of the  cervical spine were also generated. COMPARISON:  09/10/2019. FINDINGS: CT HEAD FINDINGS Brain: No evidence of acute infarction, hemorrhage, hydrocephalus, extra-axial collection or mass lesion/mass effect. Stable deep brain stimulators extend from the superior frontal bones to the thalami. Vascular: No hyperdense vessel or unexpected calcification. Skull: Normal. Negative for fracture or focal lesion. Sinuses/Orbits: Globes and orbits are unremarkable. Sinuses are clear. Other: Small right frontal scalp contusion. CT CERVICAL SPINE FINDINGS Alignment: Slight reversal the normal cervical lordosis, apex at C5-C6. Mild degenerative anterolisthesis of C3 and C4. Skull base and vertebrae: No acute fracture. No primary bone lesion or focal pathologic process. Soft tissues and spinal canal: No prevertebral fluid or swelling. No visible canal hematoma. Disc levels: Moderate loss of disc height at C4-C5, C5-C6, C6-C7 and C7-T1. Endplate spurring and mild disc bulging noted at these levels. There are facet degenerative changes greatest on the right at C3-C4. No convincing disc herniation. Upper chest: No acute findings. Other: None. IMPRESSION: HEAD CT 1. No acute intracranial abnormalities.  No skull fracture. 2. Small right frontal scalp contusion. CERVICAL CT 1. No fracture or acute  finding. Electronically Signed   By: Lajean Manes M.D.   On: 09/23/2020 15:01   CT Angio Chest Pulmonary Embolism (PE) W or WO Contrast  Result Date: 10/05/2020 CLINICAL DATA:  Shortness of breath. Diagnosed with pneumonia last week. EXAM: CT ANGIOGRAPHY CHEST WITH CONTRAST TECHNIQUE: Multidetector CT imaging of the chest was performed using the standard protocol during bolus administration of intravenous contrast. Multiplanar CT image reconstructions and MIPs were obtained to evaluate the vascular anatomy. CONTRAST:  158mL OMNIPAQUE IOHEXOL 350 MG/ML SOLN COMPARISON:  Chest x-ray 10/05/2020, CT chest abdomen pelvis 09/02/2020, chest x-ray  09/10/2019 FINDINGS: Cardiovascular: Satisfactory opacification of the pulmonary arteries to the segmental level. No evidence of pulmonary embolism. Normal heart size. No significant pericardial effusion. The thoracic aorta is normal in caliber. Mild atherosclerotic plaque of the thoracic aorta. No coronary artery calcifications. Mediastinum/Nodes: Multiple prominent but not enlarged right hilar lymph nodes there are difficult to measure due to timing of intravenous contrast. No enlarged mediastinal, hilar, or axillary lymph nodes. Subcentimeter thyroid gland hypodense nodules. Patulous esophagus. Trace debris within the trachea. Otherwise trachea and esophagus demonstrate no significant findings. Lungs/Pleura: Bilateral lower lobe heterogeneous consolidations. Interval increase in patchy nodular-like airspace opacities within the dependent right lower lobe. Persistent but improved right lower lobe heterogeneous consolidation compared to CT chest 09/02/2020. Interval increase in heterogeneous left lower lobe consolidation involving the majority of the lobe. Upper Abdomen: No acute abnormality. Musculoskeletal: Bilateral chest wall neural stimulators with leads coursing cranially and tips collimated off view. No suspicious lytic or blastic osseous lesions. No acute displaced fracture. Multilevel degenerative changes of the spine. Review of the MIP images confirms the above findings. IMPRESSION: 1. Interval worsening (compared to CT chest 09/02/2020) multifocal pneumonia/aspiration pneumonia with underlying malignancy not excluded. 2. Nodular density noted on chest x-ray correlates nodular-like airspace densities within the dependent right upper lobe. 3. No pulmonary embolus. Electronically Signed   By: Iven Finn M.D.   On: 10/05/2020 06:11   CT Cervical Spine Wo Contrast  Result Date: 09/23/2020 CLINICAL DATA:  Pt is from [place] [facility] and fell out of wheelchair, striking her head on an end table; pt has  hematoma to right side of headDM, hx of seizures EXAM: CT HEAD WITHOUT CONTRAST CT CERVICAL SPINE WITHOUT CONTRAST TECHNIQUE: Multidetector CT imaging of the head and cervical spine was performed following the standard protocol without intravenous contrast. Multiplanar CT image reconstructions of the cervical spine were also generated. COMPARISON:  09/10/2019. FINDINGS: CT HEAD FINDINGS Brain: No evidence of acute infarction, hemorrhage, hydrocephalus, extra-axial collection or mass lesion/mass effect. Stable deep brain stimulators extend from the superior frontal bones to the thalami. Vascular: No hyperdense vessel or unexpected calcification. Skull: Normal. Negative for fracture or focal lesion. Sinuses/Orbits: Globes and orbits are unremarkable. Sinuses are clear. Other: Small right frontal scalp contusion. CT CERVICAL SPINE FINDINGS Alignment: Slight reversal the normal cervical lordosis, apex at C5-C6. Mild degenerative anterolisthesis of C3 and C4. Skull base and vertebrae: No acute fracture. No primary bone lesion or focal pathologic process. Soft tissues and spinal canal: No prevertebral fluid or swelling. No visible canal hematoma. Disc levels: Moderate loss of disc height at C4-C5, C5-C6, C6-C7 and C7-T1. Endplate spurring and mild disc bulging noted at these levels. There are facet degenerative changes greatest on the right at C3-C4. No convincing disc herniation. Upper chest: No acute findings. Other: None. IMPRESSION: HEAD CT 1. No acute intracranial abnormalities.  No skull fracture. 2. Small right frontal scalp contusion. CERVICAL CT 1.  No fracture or acute finding. Electronically Signed   By: Lajean Manes M.D.   On: 09/23/2020 15:01   DG CHEST PORT 1 VIEW  Result Date: 10/06/2020 CLINICAL DATA:  70 year old female with shortness of breath. Hypertension. EXAM: PORTABLE CHEST 1 VIEW COMPARISON:  Chest CTA yesterday, and earlier. FINDINGS: Portable AP semi upright view at 0620 hours. Bilateral chest  generator devices related to deep brain stimuli. Continued low lung volumes. Stable cardiac size and mediastinal contours. No pneumothorax or pulmonary edema. No pleural effusion. Left greater than right lower lobe consolidation better demonstrated by CT yesterday. Patchy asymmetric peribronchial opacity in the right upper lobe appears stable. No areas of worsening ventilation. Visible bowel-gas pattern within normal limits, mild gas in the stomach. Stable cholecystectomy clips. IMPRESSION: 1. Stable ventilation from yesterday where left greater than right lower lobe collapse or consolidation was better demonstrated by CT. Right upper lobe bronchopneumonia. 2. No new cardiopulmonary abnormality. Electronically Signed   By: Genevie Ann M.D.   On: 10/06/2020 06:35   DG Chest Port 1 View  Result Date: 10/05/2020 CLINICAL DATA:  Question sepsis.  Pneumonia diagnosis last week. EXAM: PORTABLE CHEST 1 VIEW COMPARISON:  Chest x-ray 09/04/2020, CT chest 09/02/2020 FINDINGS: Bilateral chest wall neural stimulators noted with leads extending cranially and tips collimated off view. The heart size and mediastinal contours are unchanged. Residual nodular-like 1.5 cm density at the right base. Associated linear atelectasis versus scarring. Poorly visualized left base due to patient rotation. No pulmonary edema. No pleural effusion. No pneumothorax. No acute osseous abnormality. IMPRESSION: 1. Residual nodular-like 1.5 cm density at the right base. Recommend CT chest with intravenous contrast for further evaluation. 2. Limited evaluation of the left base due to patient rotation. This can be further evaluated on CT chest with intravenous contrast. Electronically Signed   By: Iven Finn M.D.   On: 10/05/2020 04:16     CBC Recent Labs  Lab 10/05/20 0346 10/06/20 0340 10/06/20 0444 10/06/20 1347 10/07/20 0600  WBC 5.8 5.3  --  6.8 8.8  HGB 10.0* 7.6* 7.0* 7.1* 9.7*  HCT 30.9* 24.2* 22.0* 21.8* 29.7*  PLT 133* 88*  --   98* 107*  MCV 112.8* 114.7*  --  114.7* 106.5*  MCH 36.5* 36.0*  --  37.4* 34.8*  MCHC 32.4 31.4  --  32.6 32.7  RDW 15.5 15.2  --  15.8* 19.9*  LYMPHSABS 1.9  --   --   --   --   MONOABS 0.4  --   --   --   --   EOSABS 0.0  --   --   --   --   BASOSABS 0.0  --   --   --   --     Chemistries  Recent Labs  Lab 10/05/20 0346 10/06/20 0340 10/07/20 0600  NA 134* 138 135  K 3.7 4.2 3.7  CL 97* 104 98  CO2 29 28 31   GLUCOSE 87 217* 271*  BUN 10 7* 5*  CREATININE 0.42* 0.43* 0.37*  CALCIUM 8.7* 8.0* 8.1*  AST 16  --   --   ALT 8  --   --   ALKPHOS 47  --   --   BILITOT 0.5  --   --    ------------------------------------------------------------------------------------------------------------------ No results for input(s): CHOL, HDL, LDLCALC, TRIG, CHOLHDL, LDLDIRECT in the last 72 hours.  Lab Results  Component Value Date   HGBA1C 5.7 (H) 09/04/2020   ------------------------------------------------------------------------------------------------------------------ No results for input(s):  TSH, T4TOTAL, T3FREE, THYROIDAB in the last 72 hours.  Invalid input(s): FREET3 ------------------------------------------------------------------------------------------------------------------ Recent Labs    10/06/20 0340  VITAMINB12 675  FOLATE 8.0  FERRITIN 214  TIBC 185*  IRON 20*    Coagulation profile Recent Labs  Lab 10/05/20 0346  INR 1.2    No results for input(s): DDIMER in the last 72 hours.  Cardiac Enzymes No results for input(s): CKMB, TROPONINI, MYOGLOBIN in the last 168 hours.  Invalid input(s): CK ------------------------------------------------------------------------------------------------------------------    Component Value Date/Time   BNP 81.0 08/27/2019 1156     Balin Vandegrift M.D on 10/07/2020 at 6:21 PM  Go to [email or web address] - for contact info  [facility] - Office  [phone]

## 2020-10-08 LAB — GLUCOSE, CAPILLARY
Glucose-Capillary: 184 mg/dL — ABNORMAL HIGH (ref 70–99)
Glucose-Capillary: 169 mg/dL — ABNORMAL HIGH (ref 70–99)

## 2020-10-08 LAB — VANCOMYCIN, TROUGH: Vancomycin Tr: 37 ug/mL (ref 15–20)

## 2020-10-08 LAB — CBC
HCT: 28 % — ABNORMAL LOW (ref 36.0–46.0)
Hemoglobin: 9.1 g/dL — ABNORMAL LOW (ref 12.0–15.0)
MCH: 34.9 pg — ABNORMAL HIGH (ref 26.0–34.0)
MCHC: 32.5 g/dL (ref 30.0–36.0)
MCV: 107.3 fL — ABNORMAL HIGH (ref 80.0–100.0)
Platelets: 103 10*3/uL — ABNORMAL LOW (ref 150–400)
RBC: 2.61 MIL/uL — ABNORMAL LOW (ref 3.87–5.11)
RDW: 19.2 % — ABNORMAL HIGH (ref 11.5–15.5)
WBC: 6 10*3/uL (ref 4.0–10.5)
nRBC: 0 % (ref 0.0–0.2)

## 2020-10-08 MED ORDER — QUETIAPINE FUMARATE 50 MG PO TABS: 50.0000 mg | ORAL_TABLET | Freq: Every day | ORAL | 1 refills | Status: AC

## 2020-10-08 MED ORDER — ASPIRIN EC 81 MG PO TBEC: 81.0000 mg | DELAYED_RELEASE_TABLET | Freq: Every day | ORAL | 2 refills | Status: AC

## 2020-10-08 MED ORDER — CEFDINIR 300 MG PO CAPS: 300.0000 mg | ORAL_CAPSULE | Freq: Two times a day (BID) | ORAL | 0 refills | Status: AC

## 2020-10-08 MED ORDER — FLUCONAZOLE 100 MG PO TABS: 100.0000 mg | ORAL_TABLET | Freq: Every day | ORAL | 0 refills | Status: AC

## 2020-10-08 MED ORDER — FLUTICASONE FUROATE-VILANTEROL 200-25 MCG/INH IN AEPB: 1.0000 | INHALATION_SPRAY | Freq: Every day | RESPIRATORY_TRACT | 11 refills | Status: DC

## 2020-10-08 MED ORDER — IPRATROPIUM-ALBUTEROL 0.5-2.5 (3) MG/3ML IN SOLN
3.0000 mL | Freq: Three times a day (TID) | RESPIRATORY_TRACT | Status: DC
  Administered 2020-10-08 (×2): 3 mL via RESPIRATORY_TRACT
  Filled 2020-10-08 (×2): qty 3

## 2020-10-08 MED ORDER — METOPROLOL SUCCINATE ER 50 MG PO TB24: 50.0000 mg | ORAL_TABLET | Freq: Every day | ORAL | 3 refills | Status: AC

## 2020-10-08 MED ORDER — ALBUTEROL SULFATE HFA 108 (90 BASE) MCG/ACT IN AERS: 2.0000 | INHALATION_SPRAY | Freq: Four times a day (QID) | RESPIRATORY_TRACT | 1 refills | Status: AC | PRN

## 2020-10-08 MED ORDER — DOXYCYCLINE HYCLATE 100 MG PO TABS: 100.0000 mg | ORAL_TABLET | Freq: Two times a day (BID) | ORAL | 0 refills | Status: AC

## 2020-10-08 MED ORDER — SENNOSIDES-DOCUSATE SODIUM 8.6-50 MG PO TABS: 2.0000 | ORAL_TABLET | Freq: Every day | ORAL | 1 refills | Status: DC

## 2020-10-08 MED ORDER — CLOTRIMAZOLE-BETAMETHASONE 1-0.05 % EX CREA: 1.0000 "application " | TOPICAL_CREAM | Freq: Two times a day (BID) | CUTANEOUS | 0 refills | Status: AC

## 2020-10-08 MED ORDER — ACETAMINOPHEN 325 MG PO TABS: 650.0000 mg | ORAL_TABLET | Freq: Four times a day (QID) | ORAL | 1 refills | Status: AC | PRN

## 2020-10-08 NOTE — NC FL2 (Signed)
[place] LEVEL OF CARE SCREENING TOOL     IDENTIFICATION  Patient Name: [name] Birthdate: 1950-11-12 Sex: female Admission Date (Current Location): 10/05/2020  [facility] and [place] Number:  [employer] and Address:  [facility] [address], [place]      Provider Number: [id number]  Attending Physician Name and Address:  Roxan Hockey, MD  Relative Name and Phone Number:  [name] (Daughter)   [phone]    Current Level of Care: Hospital Recommended Level of Care: Other (Comment) (LTC) Prior Approval Number:    Date Approved/Denied:   PASRR Number:    Discharge Plan: Other (Comment) (LTC)    Current Diagnoses: Patient Active Problem List   Diagnosis Date Noted   Pressure injury of skin 10/06/2020   PNA (pneumonia) 10/06/2020   COPD with acute exacerbation ([name]) 10/05/2020   Aspiration pneumonitis ([name]) 10/05/2020   Acute on chronic respiratory failure with hypoxia ([name]) 10/05/2020   Severe sepsis with Hypoxia and Hypotension- due to PNA 10/05/2020   Intertrigo of genitocrural region due to Candida/candidal intertrigo/severe cutaneous candidiasis 10/05/2020   Sepsis due to PNA 10/05/2020   Poor venous access    Pneumonia 09/02/2020   Anemia 05/14/2020   Macrocytic anemia 09/19/2019   Thrombocytopenia ([place]) 09/19/2019   Chronic back pain    Altered mental status 09/11/2019   Opioid overdose ([name]) 09/11/2019   Hypotension 09/11/2019   Acute hypoxemic respiratory failure due to COVID-19 ([name]) 69/62/9528   Uncomplicated asthma 41/32/4401   Skin erythema 02/72/5366   Umbilical pain 44/06/4740   Constipation 03/18/2017   CAP (community acquired pneumonia) 04/21/2016   History of colonic polyps    Diverticulosis of colon without hemorrhage    Other hemorrhoids    [name]'s ring    Mucosal abnormality of stomach    Dysphagia    Encounter for screening colonoscopy 02/21/2015   Upper airway  cough syndrome 01/18/2015   OAB (overactive bladder) 12/30/2014   Incontinence in female 12/25/2014   MDD (major depressive disorder), recurrent severe, without psychosis ([name]) 12/14/2014   Thrush of mouth and esophagus ([name]) 11/23/2014   Depression with somatization 11/05/2014   Type II diabetes mellitus with manifestations ([name]) 10/23/2014   Visit for screening mammogram 08/21/2014   Hypokalemia 05/30/2014   Tinea corporis 05/30/2014   Lumbar scoliosis 04/24/2014   Obstructive sleep apnea 04/23/2014   Moderate persistent asthma with acute bronchitis and acute exacerbation 03/02/2014   Allergy to angiotensin receptor blockers (ARB) 01/22/2014   S/P deep brain stimulator placement 01/10/2014   Greater trochanteric bursitis of left hip 11/07/2013   Spinal stenosis of lumbar region 10/30/2013   Right thyroid nodule 10/12/2013   CMC arthritis, thumb, degenerative 06/15/2013   GERD (gastroesophageal reflux disease) 11/03/2012   IBS (irritable bowel syndrome) 11/03/2012   Esophageal stenosis 11/01/2012   Asthma, moderate persistent, poorly-controlled 09/01/2012   Hypothyroidism 12/22/2011   Hyperlipidemia with target LDL less than 100 06/22/2008   Essential hypertension 09/04/2007   Tremor, essential 04/19/2007   Seasonal and perennial allergic rhinitis 04/19/2007    Orientation RESPIRATION BLADDER Height & Weight     Self, Time, Situation, Place  Normal Incontinent Weight: 164 lb 3.9 oz (74.5 kg) Height:  5\' 4"  (162.6 cm)  BEHAVIORAL SYMPTOMS/MOOD NEUROLOGICAL BOWEL NUTRITION STATUS      Incontinent Diet (Diet heart healthy/carb modified Room service appropriate? Yes; Fluid consistency: Thin)  AMBULATORY STATUS COMMUNICATION OF NEEDS Skin   Extensive Assist Verbally Other (Comment) (Pressure  injury stage 2 sacrum Pressure injury bilateral foot)                       Personal Care Assistance Level of Assistance  Bathing, Feeding, Dressing Bathing Assistance: Maximum  assistance Feeding assistance: Independent Dressing Assistance: Maximum assistance     Functional Limitations Info  Sight, Hearing, Speech Sight Info: Adequate Hearing Info: Adequate Speech Info: Impaired    SPECIAL CARE FACTORS FREQUENCY                       Contractures Contractures Info: Not present    Additional Factors Info  Code Status, Allergies, Insulin Sliding Scale Code Status Info: DNR Allergies Info: Ace Inhibitors, Invokana (canagliflozin), Metformin And Related,Air fresheners, Latex, Mucinex (guaifenesin Er), Oxycodone-acetaminophen, Sulfa Antibiotics   Insulin Sliding Scale Info: CBG < 70: Implement Hypoglycemia Standing Orders and refer to Hypoglycemia Standing Orders sidebar report   CBG 70 - 120: 0 units   CBG 121 - 150: 0 units   CBG 151 - 200: 0 units   CBG 201 - 250: 2 units   CBG 251 - 300: 3 units   CBG 301 - 350: 4 units   CBG 351 - 400: 5 units   CBG > 400 call MD and obtain STAT lab verification       Current Medications (10/08/2020):  This is the current hospital active medication list Current Facility-Administered Medications  Medication Dose Route Frequency Provider Last Rate Last Admin   0.9 %  sodium chloride infusion (Manually program via Guardrails IV Fluids)   Intravenous Once Adefeso, Olad[place], DO       0.9 %  sodium chloride infusion  250 mL Intravenous PRN Emokpae, [name], MD       0.9 %  sodium chloride infusion   Intravenous Continuous Emokpae, [name], MD 150 mL/hr at 10/08/20 0721 New Bag at 10/08/20 0721   acetaminophen (TYLENOL) tablet 650 mg  650 mg Oral Q6H PRN Roxan Hockey, MD       Or   acetaminophen (TYLENOL) suppository 650 mg  650 mg Rectal Q6H PRN Emokpae, [name], MD       albuterol (PROVENTIL) (2.5 MG/3ML) 0.083% nebulizer solution 2.5 mg  2.5 mg Nebulization Q2H PRN Emokpae, [name], MD       aspirin chewable tablet 81 mg  81 mg Oral q AM Emokpae, [name], MD   81 mg at 10/08/20 1007   azithromycin (ZITHROMAX) 500 mg  in sodium chloride 0.9 % 250 mL IVPB  500 mg Intravenous Q24H Emokpae, [name], MD 250 mL/hr at 10/08/20 0956 500 mg at 10/08/20 0956   baclofen (LIORESAL) tablet 5 mg  5 mg Oral QHS Emokpae, [name], MD   5 mg at 10/07/20 2232   bisacodyl (DULCOLAX) suppository 10 mg  10 mg Rectal Daily PRN Emokpae, [name], MD       ceFEPIme (MAXIPIME) 2 g in sodium chloride 0.9 % 100 mL IVPB  2 g Intravenous Q8H Emokpae, [name], MD 200 mL/hr at 10/08/20 1248 2 g at 10/08/20 1248   Chlorhexidine Gluconate Cloth 2 % PADS 6 each  6 each Topical Daily Aviva Signs, MD   6 each at 10/08/20 1119   divalproex (DEPAKOTE) DR tablet 500 mg  500 mg Oral TID Roxan Hockey, MD   500 mg at 10/08/20 0949   enoxaparin (LOVENOX) injection 40 mg  40 mg Subcutaneous Q24H Emokpae, [name], MD   40 mg at 10/08/20 0940   feeding  supplement (GLUCERNA 1.2 CAL) liquid 237 mL  237 mL Oral BID BM Emokpae, [name], MD   237 mL at 10/07/20 0942   fluconazole (DIFLUCAN) IVPB 200 mg  200 mg Intravenous Q24H Emokpae, [name], MD 100 mL/hr at 10/07/20 1737 200 mg at 10/07/20 1737   fluticasone furoate-vilanterol (BREO ELLIPTA) 100-25 MCG/INH 1 puff  1 puff Inhalation Daily Emokpae, [name], MD   1 puff at 10/08/20 0717   gabapentin (NEURONTIN) capsule 300 mg  300 mg Oral q AM Emokpae, [name], MD   300 mg at 10/08/20 1006   gabapentin (NEURONTIN) capsule 600 mg  600 mg Oral QHS Emokpae, [name], MD   600 mg at 10/07/20 2233   insulin aspart (novoLOG) injection 0-5 Units  0-5 Units Subcutaneous QHS Emokpae, [name], MD   2 Units at 10/07/20 2235   insulin aspart (novoLOG) injection 0-9 Units  0-9 Units Subcutaneous TID WC Emokpae, [name], MD   2 Units at 10/08/20 1241   insulin aspart (novoLOG) injection 3 Units  3 Units Subcutaneous TID WC Emokpae, [name], MD   3 Units at 10/08/20 0941   insulin glargine (LANTUS) injection 18 Units  18 Units Subcutaneous QHS Emokpae, [name], MD   18 Units at 10/07/20 2247   ipratropium-albuterol (DUONEB)  0.5-2.5 (3) MG/3ML nebulizer solution 3 mL  3 mL Nebulization TID Denton Brick, [name], MD   3 mL at 10/08/20 1344   levothyroxine (SYNTHROID) tablet 50 mcg  50 mcg Oral QAC breakfast Emokpae, [name], MD   50 mcg at 10/08/20 0543   lubiprostone (AMITIZA) capsule 24 mcg  24 mcg Oral BID WC Emokpae, [name], MD   24 mcg at 10/07/20 1612   methylPREDNISolone sodium succinate (SOLU-MEDROL) 40 mg/mL injection 40 mg  40 mg Intravenous Q12H Emokpae, [name], MD   40 mg at 10/08/20 0934   metoprolol tartrate (LOPRESSOR) tablet 12.5 mg  12.5 mg Oral BID Emokpae, [name], MD   12.5 mg at 10/08/20 0946   mupirocin ointment (BACTROBAN) 2 % 1 application  1 application Nasal BID Roxan Hockey, MD   1 application at 90/24/09 0958   Muscle Rub CREA 1 application  1 application Topical BID Roxan Hockey, MD   1 application at 73/53/29 0957   nystatin ointment (MYCOSTATIN)   Topical TID Roxan Hockey, MD   Given at 10/08/20 9242   omega-3 acid ethyl esters (LOVAZA) capsule 1 g  1 g Oral q AM Emokpae, [name], MD   1 g at 10/08/20 1007   ondansetron (ZOFRAN) tablet 4 mg  4 mg Oral Q6H PRN Emokpae, [name], MD       Or   ondansetron (ZOFRAN) injection 4 mg  4 mg Intravenous Q6H PRN Emokpae, [name], MD       pantoprazole (PROTONIX) EC tablet 40 mg  40 mg Oral q AM Emokpae, [name], MD   40 mg at 10/08/20 1007   polyethylene glycol (MIRALAX / GLYCOLAX) packet 17 g  17 g Oral Daily Emokpae, [name], MD   17 g at 10/07/20 0917   polyethylene glycol (MIRALAX / GLYCOLAX) packet 17 g  17 g Oral Daily PRN Emokpae, [name], MD       polyvinyl alcohol (LIQUIFILM TEARS) 1.4 % ophthalmic solution 1 drop  1 drop Both Eyes BID Emokpae, [name], MD   1 drop at 10/08/20 0958   potassium chloride SA (KLOR-CON) CR tablet 20 mEq  20 mEq Oral q AM Emokpae, [name], MD   20 mEq at 10/08/20 1006   primidone (MYSOLINE) tablet 50 mg  50 mg Oral q AM Emokpae, [name], MD   50 mg at 10/08/20 1007   QUEtiapine (SEROQUEL) tablet 25 mg   25 mg Oral QHS Emokpae, [name], MD   25 mg at 10/07/20 2232   senna-docusate (Senokot-S) tablet 2 tablet  2 tablet Oral QHS Roxan Hockey, MD   2 tablet at 10/06/20 2249   sertraline (ZOLOFT) tablet 50 mg  50 mg Oral Daily Emokpae, [name], MD   50 mg at 10/08/20 0946   sodium chloride (OCEAN) 0.65 % nasal spray 2 spray  2 spray Nasal PRN Emokpae, [name], MD       sodium chloride flush (NS) 0.9 % injection 10-40 mL  10-40 mL Intracatheter Q12H Aviva Signs, MD   30 mL at 10/08/20 1120   sodium chloride flush (NS) 0.9 % injection 10-40 mL  10-40 mL Intracatheter PRN Aviva Signs, MD       sodium chloride flush (NS) 0.9 % injection 3 mL  3 mL Intravenous Q12H Emokpae, [name], MD   3 mL at 10/07/20 2317   sodium chloride flush (NS) 0.9 % injection 3 mL  3 mL Intravenous Q12H Emokpae, [name], MD   3 mL at 10/07/20 2317   sodium chloride flush (NS) 0.9 % injection 3 mL  3 mL Intravenous PRN Emokpae, [name], MD       sodium chloride tablet 1 g  1 g Oral BID Emokpae, [name], MD   1 g at 10/08/20 0946   traZODone (DESYREL) tablet 50 mg  50 mg Oral QHS PRN Roxan Hockey, MD   50 mg at 10/07/20 2234   triamcinolone ointment (KENALOG) 0.1 %   Topical TID Roxan Hockey, MD   Given at 10/08/20 0957   valbenazine (INGREZZA) capsule 40 mg  40 mg Oral q AM Denton Brick, [name], MD   40 mg at 10/08/20 1006     Discharge Medications: Please see discharge summary for a list of discharge medications.  Relevant Imaging Results:  Relevant Lab Results:   Additional Information gabapentin, senna-docusate, sertraline  Natasha Bence, LCSW

## 2020-10-08 NOTE — Progress Notes (Signed)
Patient refused Miralax, Amitiza, and glucerna this morning due to having "diarrhea yesterday and last night." Patient stated she wanted to "hold off for today and see what happens." Will continue to monitor.

## 2020-10-08 NOTE — Evaluation (Addendum)
Physical Therapy Evaluation Patient Details Name: [name] MRN: [id number] DOB: 01-Aug-1950 Today's Date: 10/08/2020   History of Present Illness  [name]  is a 70 y.o. female  with Pmhx of COPD/asthma, chronic respiratory failure on 2 L of oxygen, obstructive sleep apnea, tremor with deep brain stimulator, thrombocytopenia, hypothyroidism, bipolar disorder, diabetes who was recently discharged from the hospital after treatment for pneumonia presents from [facility] by EMS due to worsening hypoxia and lethargy   Clinical Impression  Patient presents in bed and willing to participate in therapy. Patent demonstrated fair bed mobility with min/mod assist for rolling and side lying to sitting at EOB. Patient reported that at rehab facility the staff used a lift to transfer her to and from the chair. Patient discharged to care of nursing for out of bed as tolerated using mechanical lift for length of stay.     Follow Up Recommendations SNF    Equipment Recommendations  None recommended by PT    Recommendations for Other Services       Precautions / Restrictions Precautions Precautions: Fall Restrictions Weight Bearing Restrictions: No      Mobility  Bed Mobility Overal bed mobility: Needs Assistance Bed Mobility: Rolling;Sidelying to Sit Rolling: Mod assist;Min assist Sidelying to sit: Mod assist;Min assist       General bed mobility comments: at EOB Patient Response: Cooperative  Transfers Overall transfer level: Needs assistance               General transfer comment: patient unable to stand  Ambulation/Gait                Stairs            Wheelchair Mobility    Modified Rankin (Stroke Patients Only)       Balance Overall balance assessment: Needs assistance Sitting-balance support: Feet supported;Bilateral upper extremity supported Sitting balance-Leahy Scale: Poor Sitting balance - Comments: fair/poor at  EOB Postural control: Posterior lean                                   Pertinent Vitals/Pain Pain Assessment: No/denies pain    Home Living Family/patient expects to be discharged to:: Skilled nursing facility                      Prior Function Level of Independence: Needs assistance   Gait / Transfers Assistance Needed: Patient uses WC  ADL's / Homemaking Assistance Needed: Assisted by staff at facility  Comments: patient used lift to transfer form bed to [facility]     Hand Dominance   Dominant Hand: Right    Extremity/Trunk Assessment   Upper Extremity Assessment Upper Extremity Assessment: Generalized weakness    Lower Extremity Assessment Lower Extremity Assessment: Generalized weakness    Cervical / Trunk Assessment Cervical / Trunk Assessment: Kyphotic  Communication   Communication: No difficulties  Cognition Arousal/Alertness: Awake/alert Behavior During Therapy: WFL for tasks assessed/performed Overall Cognitive Status: Within Functional Limits for tasks assessed                                        General Comments      Exercises     Assessment/Plan    PT Assessment Patent does not need any further PT services (patient is non ambulatory at  baseline)  PT Problem List Decreased strength;Decreased activity tolerance;Decreased balance;Decreased mobility;Decreased coordination       PT Treatment Interventions      PT Goals (Current goals can be found in the Care Plan section)  Acute Rehab PT Goals Patient Stated Goal: return to ALF PT Goal Formulation: With patient Time For Goal Achievement: 10/22/20 Potential to Achieve Goals: Good    Frequency     Barriers to discharge        Co-evaluation               AM-PAC PT "6 Clicks" Mobility  Outcome Measure Help needed turning from your back to your side while in a flat bed without using bedrails?: A Lot Help needed moving from lying on your back to  sitting on the side of a flat bed without using bedrails?: A Lot Help needed moving to and from a bed to a chair (including a wheelchair)?: Total Help needed standing up from a chair using your arms (e.g., wheelchair or bedside chair)?: Total Help needed to walk in hospital room?: Total Help needed climbing 3-5 steps with a railing? : Total 6 Click Score: 8    End of Session   Activity Tolerance: Patient tolerated treatment well;Patient limited by fatigue Patient left: in bed;with call bell/phone within reach Nurse Communication: Mobility status PT Visit Diagnosis: Difficulty in walking, not elsewhere classified (R26.2);Other abnormalities of gait and mobility (R26.89);Muscle weakness (generalized) (M62.81)    Time: 0211-1552 PT Time Calculation (min) (ACUTE ONLY): 21 min   Charges:   PT Evaluation $PT Eval Moderate Complexity: 1 Mod PT Treatments $Therapeutic Activity: 8-22 mins   10:38 AM, 10/08/20 Sinclair Ship SPT  10:38 AM, 10/08/20 Lonell Grandchild, MPT Physical Therapist with [facility] [phone] office [phone] mobile phone

## 2020-10-08 NOTE — Discharge Instructions (Signed)
1)Apply clotrimazole-betamethasone cream spirally twice daily to perineum and buttock area yeast/fungal rash 2) please note that there has been several changes to medications 3) repeat CBC and BMP blood test on 10/14/2020 advised 4) patient with extensive cutaneous candidiasis of the perineum buttocks and trunk--- PCP to evaluate skin and perineal area--- may need prolonged antifungal treatment

## 2020-10-08 NOTE — Discharge Summary (Signed)
[name], is a 71 y.o. female  DOB 29-Oct-1950  MRN [id number].  Admission date:  10/05/2020  Admitting Physician  Roxan Hockey, MD  Discharge Date:  10/08/2020   Primary MD  Abran Richard, MD  Recommendations for primary care physician for things to follow:   1)Apply clotrimazole-betamethasone cream spirally twice daily to perineum and buttock area yeast/fungal rash 2) please note that there has been several changes to medications 3) repeat CBC and BMP blood test on 10/14/2020 advised 4) patient with extensive cutaneous candidiasis of the perineum buttocks and trunk--- PCP to evaluate skin and perineal area--- may need prolonged antifungal treatment  Admission Diagnosis  Aspiration pneumonitis ([name]) [J69.0] COPD with acute exacerbation ([place]) [J44.1] Dyspnea and respiratory abnormalities [R06.00, R06.89] PNA (pneumonia) [J18.9]   Discharge Diagnosis  Aspiration pneumonitis ([place]) [J69.0] COPD with acute exacerbation ([place]) [J44.1] Dyspnea and respiratory abnormalities [R06.00, R06.89] PNA (pneumonia) [J18.9]    Principal Problem:   Severe sepsis with Hypoxia and Hypotension- due to PNA Active Problems:   CAP (community acquired pneumonia)   Aspiration pneumonitis ([place])   Acute on chronic respiratory failure with hypoxia ([place])   Intertrigo of genitocrural region due to Candida/candidal intertrigo/severe cutaneous candidiasis   Sepsis due to PNA   Tremor, essential   Essential hypertension   Hypothyroidism   S/P deep brain stimulator placement   Obstructive sleep apnea   Type II diabetes mellitus with manifestations ([place])   Depression with somatization   MDD (major depressive disorder), recurrent severe, without psychosis ([place])   COPD with acute exacerbation ([place])   Pressure injury of skin   PNA (pneumonia)      Past Medical History:  Diagnosis Date   Allergic rhinitis    uses  Nasonex daily as needed   Allergy to angiotensin receptor blockers (ARB) 01/22/2014   Angioedema   Anxiety    takes Ativan daily as needed   Aspiration pneumonia ([name])    Asthma    Albuterol inhaler and Neb daily as needed   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 [name], residual palpitations   Bipolar 1 disorder ([place])    Chronic back pain    reason unknown   Constipation    COPD (chronic obstructive pulmonary disease) ([place])    DDD (degenerative disc disease), lumbar    Depression    takes Zoloft daily as well as Cymbalta   Diabetes mellitus    Type II   Dysphagia    Dysrhythmia    SVT ==ablation done by Dr. Rayann Heman 2007   Esophageal reflux    takes Zantac daily   Frequent headaches    History of bronchitis Dec 2014   Hypertension    takes Losartan daily   Hypothyroidism    takes Synthroid daily   Joint swelling    left ankle   Low sodium syndrome    Lumbar radiculopathy    Seizures ([place]) 04-05-13   one time ran out of Primidone and had stopped  it cold [place]   SVT (supraventricular tachycardia) ([name])    Tremor, essential    takes Mysoline and Neurontin daily.   Vocal cord dysfunction    Weakness    in left arm    Past Surgical History:  Procedure Laterality Date   ABDOMINAL HYSTERECTOMY     ABLATION FOR AVNRT     APPENDECTOMY     BARTHOLIN GLAND CYST EXCISION     x 2   BIOPSY  03/04/2015   Procedure: BIOPSY (Duodenal, Gastric);  Surgeon: Daneil Dolin, MD;  Location: [facility];  Service: Endoscopy;;   BIOPSY  12/19/2018   Procedure: BIOPSY;  Surgeon: Daneil Dolin, MD;  [facility];  Service: Endoscopy;;  Gastric Biopsies     BREAST SURGERY     CESAREAN SECTION     CHOLECYSTECTOMY     COLONOSCOPY  01/16/2004   QZE:[id number] rectum/colon   COLONOSCOPY WITH PROPOFOL N/A 03/04/2015   Dr.[name]- internal hemorrhoids, pancolonic diverticulosis, colonic polyp= tubular adenoma   DEEP BRAIN STIMULATOR  PLACEMENT     DENTAL SURGERY     ESOPHAGEAL DILATION N/A 03/04/2015   Procedure: ESOPHAGEAL DILATION WITH 54FR MALONEY DILATOR;  Surgeon: Daneil Dolin, MD;  Location: [facility];  Service: Endoscopy;  Laterality: N/A;   ESOPHAGEAL DILATION  12/19/2018   Procedure: ESOPHAGEAL DILATION;  Surgeon: Daneil Dolin, MD;  [facility];  Service: Endoscopy;;   ESOPHAGOGASTRODUODENOSCOPY (EGD) WITH ESOPHAGEAL DILATION  04/03/2002   QTM:[id number]'T ring, otherwise normal esophagus, status post dilation with 38 F/Normal stomach   ESOPHAGOGASTRODUODENOSCOPY (EGD) WITH ESOPHAGEAL DILATION N/A 11/08/2012   Dr. [name]:schatzki's ring s/p dilation/hiatal hernia   ESOPHAGOGASTRODUODENOSCOPY (EGD) WITH PROPOFOL N/A 03/04/2015   Dr.[name]- schatzki's ring, hiatal hernia, scattered erosions bx= chronic inactive gastritis   ESOPHAGOGASTRODUODENOSCOPY (EGD) WITH PROPOFOL N/A 05/06/2017   Normal esophagus. Dilated. Small hiatal hernia. Normal duodenal bulb and second portion of duodenum.   ESOPHAGOGASTRODUODENOSCOPY (EGD) WITH PROPOFOL N/A 12/19/2018   [name]: Mild [name] ring status post dilation, multiple erosions in the stomach, biopsies negative.   FINGER SURGERY     HEMORRHOID BANDING  2017   Dr.[name]   LEFT ANKLE LIGAMENT REPAIR     x 2   LEFT ELBOW REPAIR     MALONEY DILATION N/A 05/06/2017   Procedure: Venia Minks DILATION;  Surgeon: Daneil Dolin, MD;  [facility];  Service: Endoscopy;  Laterality: N/A;   MALONEY DILATION N/A 12/19/2018   Procedure: Venia Minks DILATION;  Surgeon: Daneil Dolin, MD;  [facility];  Service: Endoscopy;  Laterality: N/A;   MAXIMUM ACCESS (MAS)POSTERIOR LUMBAR INTERBODY FUSION (PLIF) 1 LEVEL N/A 04/24/2014   Procedure: Lumbar four-five Maximum access Surgery posterior lumbar interbody fusion;  Surgeon: [name], MD;  Location: [place] [facility];  Service: Neurosurgery;  Laterality: N/A;  Lumbar four-five Maximum access Surgery posterior lumbar interbody  fusion   MULTIPLE EXTRACTIONS WITH ALVEOLOPLASTY N/A 06/18/2017   Procedure: MULTIPLE EXTRACTION;  Surgeon: Diona Browner, DDS;  Location: [facility];  Service: Oral Surgery;  Laterality: N/A;   PARTIAL HYSTERECTOMY     POLYPECTOMY  03/04/2015   Procedure: POLYPECTOMY (descending colon);  Surgeon: Daneil Dolin, MD;  Location[facility];  Service: Endoscopy;;   PULSE GENERATOR IMPLANT Bilateral 12/15/2013   Procedure: BILATERAL PULSE GENERATOR IMPLANT;  Surgeon: [name], MD;  Location: [place] [facility];  Service: Neurosurgery;  Laterality: Bilateral;  BILATERAL   RIGHT BREAST CYST     benign   SUBTHALAMIC STIMULATOR  BATTERY REPLACEMENT Bilateral 06/24/2018   Procedure: Bilateral implantable pulse generator battery change;  Surgeon: [name], MD;  Location: [facility];  Service: Neurosurgery;  Laterality: Bilateral;  bilateral   SUBTHALAMIC STIMULATOR INSERTION Bilateral 12/04/2013   Procedure: Bilateral Deep brain stimulator placement;  Surgeon: [name], MD;  Location: [place] [facility];  Service: Neurosurgery;  Laterality: Bilateral;  Bilateral Deep brain stimulator placement   TONSILLECTOMY        HPI  from the history and physical done on the day of admission:       [name]  is a 70 y.o. female  with Pmhx of COPD/asthma, chronic respiratory failure on 2 L of oxygen, obstructive sleep apnea, tremor with deep brain stimulator, thrombocytopenia, hypothyroidism, bipolar disorder, diabetes who was recently discharged from the hospital after treatment for pneumonia presents from [facility] by EMS due to worsening hypoxia and lethargy -Patient required BiPAP in the ED for prolonged hours -Remained tachycardic, tachypneic and hypotensive required fluid boluses -Lactic acid and WBC was not elevated   -Patient is lethargic and disoriented -Phone call placed to patient's daughter, questions answered -CTA chest consistent with worsening pneumonia--concern for possible  aspiration     Hospital Course:    A/p 1)Severe Sepsis Secondary to Pneumonia--- POA -On admission patient had tachycardia, tachypnea, hypotension and worsening hypoxia becoming BiPAP dependent -Lactic acid is not elevated , WBC not elevated -Treated with IV vancomycin, azithromycin and cefepime and IV fluids --Had transient hypotension resolved with IV fluids, did not end up requiring Levophed for pressure support  -Cough and dyspnea improved significantly -Hypoxia resolved, patient has been weaned off oxygen -Urine and blood cultures NGTD -Sepsis pathophysiology has resolved, hemodynamically stable at this time -Okay to discharge on p.o. Omnicef and doxycycline -Vancomycin level was documented as trough in the lab section is more actually like a peak level because it was drawn shortly after vancomycin was administered  2)Multifocal Pneumonia--?HCAP, ???  Aspiration related -CTA chest from 10/05/2020 shows Interval worsening (compared to CT chest 09/02/2020) multifocal pneumonia/aspiration pneumonia with underlying malignancy not excluded. -Chest x-ray from 10/06/2020 stable with right upper lobe bronchopneumonia -Antibiotics as above #1   3)Acute on chronic Hypoxic Respiratory Failure secondary to #1 and #2 above--- -Patient remains hypoxic despite high flow nasal cannula, required BiPAP for several hours for respiratory support -Antibiotics as above #1 Hypoxia resolved  -overall respiratory status improved significantly- -Patient's mentation is back to baseline -PTA patient used oxygen via nasal cannula at the facility only as needed at this time she is on room air not requiring any oxygen  4)HTN--stable, okay to resume metoprolol and Cardizem  5)Chronic Tremors--status post prior deep brain stimulator -Patient sees Dr. [name] (Neurology) -Continue on primidone   6)Bipolar Disorder---  continue Depakote, Zoloft, will decrease Seroquel to 50 mg nightly due to excessive  sleepiness   7)DM2-A1c is 5.7 reflecting excellent diabetic control PTA -Okay to resume metformin and Lantus insulin   8) acute on chronic chronic anemia--elevated MCV and MCH noted -Hemoglobin is up to 9.1 from 7.6 after transfusion of 1 unit of PRBC on 10/06/2020 - baseline appears to be around 9 usually--- suspect some component of hemodilution due to IV fluids for sepsis protocol -B12 and folate are not low, ferritin WNL, serum iron is 20 ,TIBC 185 -No evidence of ongoing bleeding -Patient states she is not a Jehov[employer] Witness, RN Collene Leyden and myself spoke with patient about possible blood transfusion--- patient is coherent, states she has No  objection to blood transfusion if needed   9) hypothyroidism--continue levothyroxine   10) social/ethics--patient has a DNR form, patient also has a MOST form, ---patient's daughter [name] is her only surviving child----[phone]------ they have not seen each other in about 5 years and they rarely speak.  The relationship is not very close -Patient has history of mental illness as well as prior history of substance abuse   11) candidal intertrigo/severe cutaneous candidiasis--- rather extensive candidiasis, see photos in epic -- -Treated with Mycolog ointment and IV Diflucan here in the hospital okay to discharge on oral Diflucan and Clotrimazole-betamethasone cream  -patient with extensive cutaneous candidiasis of the perineum buttocks and trunk--- PCP to evaluate skin and perineal area--- may need prolonged antifungal treatment   12) thrombocytopenia----monitor closely no active bleeding noted, platelet count currently close to prior baseline   Disposition/--- discharge back to SNF with PT   Dispo: The patient is from: SNF              Anticipated d/c is to: SNF                Code Status :  -  Code Status: DNR  Family Communication:   (patient is alert, awake and coherent) -daughter [name] is her only surviving  child----[phone]------ they have not seen each other in about 5 years and they rarely speak. Consults  :  na  Discharge Condition: stable  Follow UP-pcp   Diet and Activity recommendation:  As advised  Discharge Instructions    Discharge Instructions     Call MD for:  difficulty breathing, headache or visual disturbances   Complete by: As directed    Call MD for:  persistant dizziness or light-headedness   Complete by: As directed    Call MD for:  persistant nausea and vomiting   Complete by: As directed    Call MD for:  temperature >100.4   Complete by: As directed    Diet - low sodium heart healthy   Complete by: As directed    Discharge instructions   Complete by: As directed    1)Apply clotrimazole-betamethasone cream spirally twice daily to perineum and buttock area yeast/fungal rash 2) please note that there has been several changes to medications 3) repeat CBC and BMP blood test on 10/14/2020 advised 4) patient with extensive cutaneous candidiasis of the perineum buttocks and trunk--- PCP to evaluate skin and perineal area--- may need prolonged antifungal treatment   Discharge wound care:   Complete by: As directed    Wound care as above--clotrimazole-betamethasone cream twice a day to affected areas   Increase activity slowly   Complete by: As directed          Discharge Medications     Allergies as of 10/08/2020       Reactions   Ace Inhibitors Other (See Comments)   History of angioedema with losartan, ARB   Invokana [canagliflozin] Other (See Comments)   Yeast infectios   Losartan Other (See Comments)   Angioedema   Metformin And Related Diarrhea   Other Other (See Comments)   Air fresheners - on MAR   Latex Other (See Comments)   Other Reaction: redness, blisters   Mucinex [guaifenesin Er] Other (See Comments)   MUCINEX REACTION: asthma attacks   Oxycodone-acetaminophen Itching, Other (See Comments)   TYLOX. Caused internal itching per patient     Sulfa Antibiotics Other (See Comments)        Medication List  STOP taking these medications    aspirin 81 MG chewable tablet Replaced by: aspirin EC 81 MG tablet   docusate sodium 100 MG capsule Commonly known as: COLACE   fluticasone furoate-vilanterol 100-25 MCG/INH Aepb Commonly known as: Breo Ellipta Replaced by: fluticasone furoate-vilanterol 200-25 MCG/INH Aepb   metoprolol tartrate 50 MG tablet Commonly known as: LOPRESSOR   predniSONE 20 MG tablet Commonly known as: DELTASONE       TAKE these medications    acetaminophen 325 MG tablet Commonly known as: TYLENOL Take 2 tablets (650 mg total) by mouth every 6 (six) hours as needed for mild pain (or Fever >/= 101).   albuterol 108 (90 Base) MCG/ACT inhaler Commonly known as: ProAir HFA Inhale 2 puffs into the lungs every 6 (six) hours as needed for wheezing or shortness of breath.   aspirin EC 81 MG tablet Take 1 tablet (81 mg total) by mouth daily with breakfast. Replaces: aspirin 81 MG chewable tablet   Baclofen 5 MG Tabs Take 5 mg by mouth at bedtime.   Biofreeze 4 % Gel Generic drug: Menthol (Topical Analgesic) Apply 1 application topically 2 (two) times daily. Apply to lower back   Carboxymethylcellulose Sod PF 0.5 % Soln Place 1 drop into both eyes 2 (two) times daily.   cefdinir 300 MG capsule Commonly known as: OMNICEF Take 1 capsule (300 mg total) by mouth 2 (two) times daily for 5 days.   clotrimazole-betamethasone cream Commonly known as: LOTRISONE Apply 1 application topically 2 (two) times daily. Apply spirally twice daily to perineum and buttock area yeast/fungal rash   diltiazem 180 MG 24 hr capsule Commonly known as: CARDIZEM CD Take 1 capsule (180 mg total) by mouth daily.   divalproex 500 MG DR tablet Commonly known as: DEPAKOTE Take 500 mg by mouth 3 (three) times daily.   doxycycline 100 MG tablet Commonly known as: VIBRA-TABS Take 1 tablet (100 mg total) by mouth  2 (two) times daily for 5 days.   fluconazole 100 MG tablet Commonly known as: Diflucan Take 1 tablet (100 mg total) by mouth daily for 7 days.   fluticasone furoate-vilanterol 200-25 MCG/INH Aepb Commonly known as: Breo Ellipta Inhale 1 puff into the lungs daily. Replaces: fluticasone furoate-vilanterol 100-25 MCG/INH Aepb   gabapentin 300 MG capsule Commonly known as: NEURONTIN Take 300 mg by mouth in the morning.   gabapentin 600 MG tablet Commonly known as: NEURONTIN Take 600 mg by mouth at bedtime.   Glucagon Emergency 1 MG Kit Inject as directed. Inject 1 mg intramuscularly every 24 hours as needed for low blood glucose blood glucose below 60 and unable to take oral glucose replacement.   Glucerna Liqd Take 237 mLs by mouth in the morning and at bedtime.   guaiFENesin-dextromethorphan 100-10 MG/5ML syrup Commonly known as: ROBITUSSIN DM Take 5 mLs by mouth every 4 (four) hours as needed for cough.   insulin glargine 100 UNIT/ML injection Commonly known as: LANTUS Inject 15 Units into the skin at bedtime.   insulin lispro 100 UNIT/ML injection Commonly known as: HUMALOG Inject 0-10 Units into the skin 3 (three) times daily before meals. 0-59 call MD 60-200=0 201-250= 2 units 251-300=4 units 301-350= 6 units 351-400= 8 units 401-450= 10 units 451+ Call MD   ipratropium 0.03 % nasal spray Commonly known as: ATROVENT Place 2 sprays into both nostrils 3 (three) times daily before meals.   ipratropium-albuterol 0.5-2.5 (3) MG/3ML Soln Commonly known as: DUONEB Take 3 mLs by nebulization every 6 (six)  hours as needed (wheezing or shortness of breath).   lactulose 10 GM/15ML solution Commonly known as: CHRONULAC Take 10 g by mouth daily as needed for mild constipation.   levothyroxine 50 MCG tablet Commonly known as: SYNTHROID Take 1 tablet (50 mcg total) by mouth daily before breakfast.   loperamide 2 MG tablet Commonly known as: IMODIUM A-D Take 2 mg by  mouth every 8 (eight) hours as needed for diarrhea or loose stools.   lubiprostone 24 MCG capsule Commonly known as: AMITIZA Take 1 capsule (24 mcg total) by mouth 2 (two) times daily with a meal.   magnesium hydroxide 400 MG/5ML suspension Commonly known as: MILK OF MAGNESIA Take 30 mLs by mouth at bedtime as needed for mild constipation.   metFORMIN 500 MG tablet Commonly known as: GLUCOPHAGE Take 500 mg by mouth 2 (two) times daily with a meal.   metoprolol succinate 50 MG 24 hr tablet Commonly known as: TOPROL-XL Take 1 tablet (50 mg total) by mouth daily.   nystatin powder Commonly known as: MYCOSTATIN/NYSTOP Apply 1 application topically every 8 (eight) hours.   omega-3 acid ethyl esters 1 g capsule Commonly known as: LOVAZA Take 1 g by mouth in the morning.   OXYGEN Inhale 2-5 L into the lungs as needed (if resident oxygen is 90% or less).   pantoprazole 40 MG tablet Commonly known as: PROTONIX Take 40 mg by mouth in the morning.   potassium chloride SA 20 MEQ tablet Commonly known as: KLOR-CON Take 20 mEq by mouth in the morning.   primidone 50 MG tablet Commonly known as: MYSOLINE Take 50 mg by mouth in the morning.   QUEtiapine 50 MG tablet Commonly known as: SEROQUEL Take 1 tablet (50 mg total) by mouth at bedtime. What changed:  medication strength how much to take   senna 8.6 MG Tabs tablet Commonly known as: SENOKOT Take 2 tablets by mouth daily as needed for mild constipation.   senna-docusate 8.6-50 MG tablet Commonly known as: Senokot-S Take 2 tablets by mouth at bedtime.   sertraline 50 MG tablet Commonly known as: ZOLOFT Take 50 mg by mouth daily.   sitaGLIPtin 100 MG tablet Commonly known as: JANUVIA Take 100 mg by mouth in the morning.   sodium chloride 0.65 % nasal spray Commonly known as: OCEAN Place 2 sprays into the nose as needed (Rhinitis). Both nostrils   sodium chloride 1 g tablet Take 1 g by mouth 2 (two) times  daily.   Systane 0.4-0.3 % Soln Generic drug: Polyethyl Glycol-Propyl Glycol Place 1 drop into both eyes every 8 (eight) hours as needed (dry eyes).   valbenazine 40 MG capsule Commonly known as: INGREZZA Take 40 mg by mouth in the morning.       ASK your doctor about these medications    polyethylene glycol 17 g packet Commonly known as: MIRALAX / GLYCOLAX Take 17 g by mouth daily as needed for moderate constipation.   polyethylene glycol-electrolytes 420 g solution Commonly known as: NuLYTELY As directed               Discharge Care Instructions  (From admission, onward)           Start     Ordered   10/08/20 0000  Discharge wound care:       Comments: Wound care as above--clotrimazole-betamethasone cream twice a day to affected areas   10/08/20 1325            Major procedures and Radiology  Reports - PLEASE review detailed and final reports for all details, in brief -      CT Head Wo Contrast  Result Date: 09/23/2020 CLINICAL DATA:  Pt is from [place] [facility] and fell out of wheelchair, striking her head on an end table; pt has hematoma to right side of headDM, hx of seizures EXAM: CT HEAD WITHOUT CONTRAST CT CERVICAL SPINE WITHOUT CONTRAST TECHNIQUE: Multidetector CT imaging of the head and cervical spine was performed following the standard protocol without intravenous contrast. Multiplanar CT image reconstructions of the cervical spine were also generated. COMPARISON:  09/10/2019. FINDINGS: CT HEAD FINDINGS Brain: No evidence of acute infarction, hemorrhage, hydrocephalus, extra-axial collection or mass lesion/mass effect. Stable deep brain stimulators extend from the superior frontal bones to the thalami. Vascular: No hyperdense vessel or unexpected calcification. Skull: Normal. Negative for fracture or focal lesion. Sinuses/Orbits: Globes and orbits are unremarkable. Sinuses are clear. Other: Small right frontal scalp contusion. CT CERVICAL SPINE FINDINGS  Alignment: Slight reversal the normal cervical lordosis, apex at C5-C6. Mild degenerative anterolisthesis of C3 and C4. Skull base and vertebrae: No acute fracture. No primary bone lesion or focal pathologic process. Soft tissues and spinal canal: No prevertebral fluid or swelling. No visible canal hematoma. Disc levels: Moderate loss of disc height at C4-C5, C5-C6, C6-C7 and C7-T1. Endplate spurring and mild disc bulging noted at these levels. There are facet degenerative changes greatest on the right at C3-C4. No convincing disc herniation. Upper chest: No acute findings. Other: None. IMPRESSION: HEAD CT 1. No acute intracranial abnormalities.  No skull fracture. 2. Small right frontal scalp contusion. CERVICAL CT 1. No fracture or acute finding. Electronically Signed   By: Lajean Manes M.D.   On: 09/23/2020 15:01   CT Angio Chest Pulmonary Embolism (PE) W or WO Contrast  Result Date: 10/05/2020 CLINICAL DATA:  Shortness of breath. Diagnosed with pneumonia last week. EXAM: CT ANGIOGRAPHY CHEST WITH CONTRAST TECHNIQUE: Multidetector CT imaging of the chest was performed using the standard protocol during bolus administration of intravenous contrast. Multiplanar CT image reconstructions and MIPs were obtained to evaluate the vascular anatomy. CONTRAST:  126m OMNIPAQUE IOHEXOL 350 MG/ML SOLN COMPARISON:  Chest x-ray 10/05/2020, CT chest abdomen pelvis 09/02/2020, chest x-ray 09/10/2019 FINDINGS: Cardiovascular: Satisfactory opacification of the pulmonary arteries to the segmental level. No evidence of pulmonary embolism. Normal heart size. No significant pericardial effusion. The thoracic aorta is normal in caliber. Mild atherosclerotic plaque of the thoracic aorta. No coronary artery calcifications. Mediastinum/Nodes: Multiple prominent but not enlarged right hilar lymph nodes there are difficult to measure due to timing of intravenous contrast. No enlarged mediastinal, hilar, or axillary lymph nodes.  Subcentimeter thyroid gland hypodense nodules. Patulous esophagus. Trace debris within the trachea. Otherwise trachea and esophagus demonstrate no significant findings. Lungs/Pleura: Bilateral lower lobe heterogeneous consolidations. Interval increase in patchy nodular-like airspace opacities within the dependent right lower lobe. Persistent but improved right lower lobe heterogeneous consolidation compared to CT chest 09/02/2020. Interval increase in heterogeneous left lower lobe consolidation involving the majority of the lobe. Upper Abdomen: No acute abnormality. Musculoskeletal: Bilateral chest wall neural stimulators with leads coursing cranially and tips collimated off view. No suspicious lytic or blastic osseous lesions. No acute displaced fracture. Multilevel degenerative changes of the spine. Review of the MIP images confirms the above findings. IMPRESSION: 1. Interval worsening (compared to CT chest 09/02/2020) multifocal pneumonia/aspiration pneumonia with underlying malignancy not excluded. 2. Nodular density noted on chest x-ray correlates nodular-like airspace densities within the dependent right  upper lobe. 3. No pulmonary embolus. Electronically Signed   By: Iven Finn M.D.   On: 10/05/2020 06:11   CT Cervical Spine Wo Contrast  Result Date: 09/23/2020 CLINICAL DATA:  Pt is from [place] [facility] and fell out of wheelchair, striking her head on an end table; pt has hematoma to right side of headDM, hx of seizures EXAM: CT HEAD WITHOUT CONTRAST CT CERVICAL SPINE WITHOUT CONTRAST TECHNIQUE: Multidetector CT imaging of the head and cervical spine was performed following the standard protocol without intravenous contrast. Multiplanar CT image reconstructions of the cervical spine were also generated. COMPARISON:  09/10/2019. FINDINGS: CT HEAD FINDINGS Brain: No evidence of acute infarction, hemorrhage, hydrocephalus, extra-axial collection or mass lesion/mass effect. Stable deep brain stimulators  extend from the superior frontal bones to the thalami. Vascular: No hyperdense vessel or unexpected calcification. Skull: Normal. Negative for fracture or focal lesion. Sinuses/Orbits: Globes and orbits are unremarkable. Sinuses are clear. Other: Small right frontal scalp contusion. CT CERVICAL SPINE FINDINGS Alignment: Slight reversal the normal cervical lordosis, apex at C5-C6. Mild degenerative anterolisthesis of C3 and C4. Skull base and vertebrae: No acute fracture. No primary bone lesion or focal pathologic process. Soft tissues and spinal canal: No prevertebral fluid or swelling. No visible canal hematoma. Disc levels: Moderate loss of disc height at C4-C5, C5-C6, C6-C7 and C7-T1. Endplate spurring and mild disc bulging noted at these levels. There are facet degenerative changes greatest on the right at C3-C4. No convincing disc herniation. Upper chest: No acute findings. Other: None. IMPRESSION: HEAD CT 1. No acute intracranial abnormalities.  No skull fracture. 2. Small right frontal scalp contusion. CERVICAL CT 1. No fracture or acute finding. Electronically Signed   By: Lajean Manes M.D.   On: 09/23/2020 15:01   DG CHEST PORT 1 VIEW  Result Date: 10/06/2020 CLINICAL DATA:  70 year old female with shortness of breath. Hypertension. EXAM: PORTABLE CHEST 1 VIEW COMPARISON:  Chest CTA yesterday, and earlier. FINDINGS: Portable AP semi upright view at 0620 hours. Bilateral chest generator devices related to deep brain stimuli. Continued low lung volumes. Stable cardiac size and mediastinal contours. No pneumothorax or pulmonary edema. No pleural effusion. Left greater than right lower lobe consolidation better demonstrated by CT yesterday. Patchy asymmetric peribronchial opacity in the right upper lobe appears stable. No areas of worsening ventilation. Visible bowel-gas pattern within normal limits, mild gas in the stomach. Stable cholecystectomy clips. IMPRESSION: 1. Stable ventilation from yesterday  where left greater than right lower lobe collapse or consolidation was better demonstrated by CT. Right upper lobe bronchopneumonia. 2. No new cardiopulmonary abnormality. Electronically Signed   By: Genevie Ann M.D.   On: 10/06/2020 06:35   DG Chest Port 1 View  Result Date: 10/05/2020 CLINICAL DATA:  Question sepsis.  Pneumonia diagnosis last week. EXAM: PORTABLE CHEST 1 VIEW COMPARISON:  Chest x-ray 09/04/2020, CT chest 09/02/2020 FINDINGS: Bilateral chest wall neural stimulators noted with leads extending cranially and tips collimated off view. The heart size and mediastinal contours are unchanged. Residual nodular-like 1.5 cm density at the right base. Associated linear atelectasis versus scarring. Poorly visualized left base due to patient rotation. No pulmonary edema. No pleural effusion. No pneumothorax. No acute osseous abnormality. IMPRESSION: 1. Residual nodular-like 1.5 cm density at the right base. Recommend CT chest with intravenous contrast for further evaluation. 2. Limited evaluation of the left base due to patient rotation. This can be further evaluated on CT chest with intravenous contrast. Electronically Signed   By: Iven Finn  M.D.   On: 10/05/2020 04:16    Micro Results   Recent Results (from the past 240 hour(s))  Resp Panel by RT-PCR (Flu A&B, Covid) Nasopharyngeal Swab     Status: None   Collection Time: 10/05/20  3:45 AM   Specimen: Nasopharyngeal Swab; Nasopharyngeal(NP) swabs in vial transport medium  Result Value Ref Range Status   SARS Coronavirus 2 by RT PCR NEGATIVE NEGATIVE Final    Comment: (NOTE) SARS-CoV-2 target nucleic acids are NOT DETECTED.  The SARS-CoV-2 RNA is generally detectable in upper respiratory specimens during the acute phase of infection. The lowest concentration of SARS-CoV-2 viral copies this assay can detect is 138 copies/mL. A negative result does not preclude SARS-Cov-2 infection and should not be used as the sole basis for treatment  or other patient management decisions. A negative result may occur with  improper specimen collection/handling, submission of specimen other than nasopharyngeal swab, presence of viral mutation(s) within the areas targeted by this assay, and inadequate number of viral copies(<138 copies/mL). A negative result must be combined with clinical observations, patient history, and epidemiological information. The expected result is Negative.  Fact Sheet for Patients:  EntrepreneurPulse.com.au  Fact Sheet for Healthcare Providers:  IncredibleEmployment.be  This test is no t yet approved or cleared by the [place] FDA and  has been authorized for detection and/or diagnosis of SARS-CoV-2 by FDA under an Emergency Use Authorization (EUA). This EUA will remain  in effect (meaning this test can be used) for the duration of the COVID-19 declaration under Section 564(b)(1) of the Act, 21 U.S.C.section 360bbb-3(b)(1), unless the authorization is terminated  or revoked sooner.       Influenza A by PCR NEGATIVE NEGATIVE Final   Influenza B by PCR NEGATIVE NEGATIVE Final    Comment: (NOTE) The Xpert Xpress SARS-CoV-2/FLU/RSV plus assay is intended as an aid in the diagnosis of influenza from Nasopharyngeal swab specimens and should not be used as a sole basis for treatment. Nasal washings and aspirates are unacceptable for Xpert Xpress SARS-CoV-2/FLU/RSV testing.  Fact Sheet for Patients: EntrepreneurPulse.com.au  Fact Sheet for Healthcare Providers: IncredibleEmployment.be  This test is not yet approved or cleared by the [place] FDA and has been authorized for detection and/or diagnosis of SARS-CoV-2 by FDA under an Emergency Use Authorization (EUA). This EUA will remain in effect (meaning this test can be used) for the duration of the COVID-19 declaration under Section 564(b)(1) of the Act, 21 U.S.C. section  360bbb-3(b)(1), unless the authorization is terminated or revoked.  Performed at [facility], [address] [place], [place] [address]   Blood culture (routine single)     Status: None (Preliminary result)   Collection Time: 10/05/20  4:24 AM   Specimen: BLOOD LEFT ARM  Result Value Ref Range Status   Specimen Description BLOOD LEFT ARM  Final   Special Requests   Final    BOTTLES DRAWN AEROBIC AND ANAEROBIC Blood Culture adequate volume   Culture   Final    NO GROWTH 1 DAY Performed at [facility], [address], [place], [place] [address]    Report Status PENDING  Incomplete  Culture, blood (Routine X 2) w Reflex to ID Panel     Status: None (Preliminary result)   Collection Time: 10/05/20  8:44 AM   Specimen: Right Antecubital; Blood  Result Value Ref Range Status   Specimen Description   Final    RIGHT ANTECUBITAL BOTTLES DRAWN AEROBIC AND ANAEROBIC   Special Requests Blood Culture  adequate volume  Final   Culture   Final    NO GROWTH 1 DAY Performed at [facility], [address] [place], [place] [address]    Report Status PENDING  Incomplete  Urine culture     Status: Abnormal   Collection Time: 10/05/20  9:53 AM   Specimen: Urine, Catheterized  Result Value Ref Range Status   Specimen Description   Final    URINE, CATHETERIZED Performed at [facility], [address], [place], [place] [address]    Special Requests   Final    NONE Performed at [facility], [address], [place], [place] [address]    Culture MULTIPLE SPECIES PRESENT, SUGGEST RECOLLECTION (A)  Final   Report Status 10/07/2020 FINAL  Final  MRSA Next Gen by PCR, Nasal     Status: Abnormal   Collection Time: 10/05/20  3:50 PM   Specimen: Nasal Mucosa; Nasal Swab  Result Value Ref Range Status   MRSA by PCR Next Gen DETECTED (A) NOT DETECTED Final    Comment: CRITICAL RESULT CALLED TO, READ BACK BY AND VERIFIED WITH: MAYNARD,R [id number] 10/05/2020 COLEMAN,R (NOTE) The GeneXpert MRSA Assay (FDA  approved for NASAL specimens only), is one component of a comprehensive MRSA colonization surveillance program. It is not intended to diagnose MRSA infection nor to guide or monitor treatment for MRSA infections. Test performance is not FDA approved in patients less than 85 years old. Performed at [facility], [address], [place], [place] [address]        Today   Subjective    Rashia Mckesson today has no new complaints  -Comfortable,  No fever  Or chills  No nausea, vomiting, diarrhea -Completely weaned off oxygen at this time        Patient has been seen and examined prior to discharge   Objective   Blood pressure (!) 155/95, pulse 86, temperature 98.6 F (37 C), temperature source Oral, resp. rate 16, height _0  (1.626 m), weight 74.5 kg, SpO2 100 %.   Intake/Output Summary (Last 24 hours) at 10/08/2020 1326 Last data filed at 10/08/2020 1258 Gross per 24 hour  Intake 4456.91 ml  Output 5100 ml  Net -643.09 ml    Exam Gen:- Awake , no acute distress, no conversational dyspnea HEENT:- [address].AT, No sclera icterus, left frontal area with neurostimulator Neck-Supple Neck,No JVD, neurostimulator wires palpated in the neck area Lungs-improved air movement, no wheezing CV- S1, S2 normal, regular, neurostimulator palpated over both subclavian areas Abd-  +ve B.Sounds, Abd Soft, No tenderness,    ExtremitNo  edema, pedal pulses present Psych-affect is appropriate, oriented x3 Neuro-generalized weakness ,no new focal deficits, no tremors -Skin/GU-extensive cutaneous candidiasis/candidal intertrigo please see photos in epic MSK-right femoral catheter site hemostatic--- right femoral catheter removed prior to discharge  - Media Information         Document Information  Photos  Perineum  10/05/2020 17:01  Attached To:  Hospital Encounter on 10/05/20   Source Information  Roxan Hockey, MD  [facility]   -  Media  Information         Document Information  Photos  Buttocks   10/05/2020 17:02  Attached To:  Hospital Encounter on 10/05/20   Source Information  Roxan Hockey, MD  [facility]   - Media Information         Document Information  Photos  Rash on back   10/05/2020 17:03  Attached To:  Hospital Encounter on 10/05/20   Source Information  Roxan Hockey, MD  [facility]     Data Review   CBC w Diff:  Lab Results  Component Value Date   WBC 6.0 10/08/2020   HGB 9.1 (L) 10/08/2020   HCT 28.0 (L) 10/08/2020   PLT 103 (L) 10/08/2020   LYMPHOPCT 32 10/05/2020   BANDSPCT 8 09/05/2020   MONOPCT 7 10/05/2020   EOSPCT 1 10/05/2020   BASOPCT 0 10/05/2020    CMP:  Lab Results  Component Value Date   NA 135 10/07/2020   NA 136 (A) 09/21/2019   K 3.7 10/07/2020   CL 98 10/07/2020   CO2 31 10/07/2020   BUN 5 (L) 10/07/2020   BUN 12 09/21/2019   CREATININE 0.37 (L) 10/07/2020   CREATININE 0.70 12/27/2012   GLU 166 09/21/2019   PROT 6.5 10/05/2020   ALBUMIN 2.8 (L) 10/05/2020   BILITOT 0.5 10/05/2020   ALKPHOS 47 10/05/2020   AST 16 10/05/2020   ALT 8 10/05/2020  .   Total Discharge time is about 33 minutes  Roxan Hockey M.D on 10/08/2020 at 1:26 PM  Go to [email or web address] -  for contact info  [facility] - Office  [phone]

## 2020-10-08 NOTE — TOC Transition Note (Signed)
Transition of Care [facility]) - CM/SW Discharge Note   Patient Details  Name: [name] MRN: [id number] Date of Birth: [birth year]  Transition [facility] CM/SW Contact:  Natasha Bence, LCSW Phone Number: 10/08/2020, 2:14 PM   Clinical Narrative:    [facility] is agreeable to accept patient today and provide transportation. Nurse to call report. TOC signing off.    Final next level of care: Long Term Nursing Home Barriers to Discharge: Barriers Resolved   Patient Goals and CMS Choice Patient states their goals for this hospitalization and ongoing recovery are:: Return to [facility] as LTC CMS Medicare.gov Compare Post Acute Care list provided to:: Patient Choice offered to / list presented to : Patient  Discharge Placement                Patient to be transferred to facility by: [facility] Name of family member notified: [name] (Daughter)   [phone] Patient and family notified of of transfer: 10/08/20  Discharge Plan and Services                                     Social Determinants of Health ([place]) Interventions     Readmission Risk Interventions Readmission Risk Prevention Plan 09/13/2019 09/11/2019  Transportation Screening - Complete  PCP or Specialist Appt within 3-5 Days Complete -  Medication Review (RN CM) - Complete  HRI or Home Care Consult Complete -  Social Work Consult for [place] Planning/Counseling Complete -  Palliative Care Screening Not Applicable -  Medication Review (RN Care Manager) Referral to Pharmacy -  Some recent data might be hidden

## 2020-10-10 LAB — TYPE AND SCREEN
ABO/RH(D): O POS
Antibody Screen: NEGATIVE
Unit division: 0
Unit division: 0

## 2020-10-10 LAB — BPAM RBC
Blood Product Expiration Date: 202208052359
Blood Product Expiration Date: 202208052359
ISSUE DATE / TIME: 202207031625
Unit Type and Rh: 5100
Unit Type and Rh: 5100

## 2020-10-10 LAB — CULTURE, BLOOD (ROUTINE X 2)
Culture: NO GROWTH
Special Requests: ADEQUATE

## 2020-10-10 LAB — CULTURE, BLOOD (SINGLE)
Culture: NO GROWTH
Special Requests: ADEQUATE

## 2020-11-19 ENCOUNTER — Ambulatory Visit (INDEPENDENT_AMBULATORY_CARE_PROVIDER_SITE_OTHER): Payer: 59 | Admitting: Internal Medicine

## 2020-11-19 ENCOUNTER — Encounter: Payer: Self-pay | Admitting: Internal Medicine

## 2020-11-19 ENCOUNTER — Other Ambulatory Visit: Payer: Self-pay

## 2020-11-19 VITALS — BP 128/72 | HR 66 | Temp 97.5°F | Ht 66.0 in | Wt 184.0 lb

## 2020-11-19 DIAGNOSIS — K219 Gastro-esophageal reflux disease without esophagitis: Secondary | ICD-10-CM | POA: Diagnosis not present

## 2020-11-19 DIAGNOSIS — K5903 Drug induced constipation: Secondary | ICD-10-CM | POA: Diagnosis not present

## 2020-11-19 DIAGNOSIS — K591 Functional diarrhea: Secondary | ICD-10-CM | POA: Diagnosis not present

## 2020-11-19 DIAGNOSIS — R1310 Dysphagia, unspecified: Secondary | ICD-10-CM

## 2020-11-19 NOTE — Progress Notes (Signed)
Primary Care Physician:  Abran Richard, MD Primary Gastroenterologist:  Dr. Gala Romney  Pre-Procedure History & Physical: HPI:  [name] is a 70 y.o. female here for ollow-up on GERD and constipation.  She has chronically altered bowel function.  She is on multiple agents cited on her nursing home MAR.  Has been taking Amitiza, Imodium, magnesium preparations and senna.  States she goes a week without a bowel movement - when she has one , its loose.  She has not passed any blood whatsoever.  No GERD symptoms on Protonix 40 mg daily.  She has no dysphagia.  Her esophagus has been empirically dilated previously.  In 2016, she had 1 small adenoma removed from her left colon.  Tentative plans were for her to come back for surveillance colonoscopy.  In the interim, she has been hospitalized with a severe illness including pneumonia with pulmonary sepsis.  Extensive cutaneous candidiasis.  She is quite debilitated.  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 [name], residual palpitations   Bipolar 1 disorder ([place])    Chronic back pain    reason unknown   Constipation    COPD (chronic obstructive pulmonary disease) ([place])    DDD (degenerative disc disease), lumbar    Depression    takes Zoloft daily as well as Cymbalta   Diabetes mellitus    Type II   Dysphagia    Dysrhythmia    SVT ==ablation done by Dr. Rayann Heman 2007   Esophageal reflux    takes Zantac daily   Frequent headaches    History of bronchitis Dec 2014   Hypertension    takes Losartan daily   Hypothyroidism    takes Synthroid daily   Joint swelling    left ankle   Low sodium  syndrome    Lumbar radiculopathy    Seizures ([place]) 04-05-13   one time ran out of Primidone and had stopped it cold [place]   SVT (supraventricular tachycardia) ([name])    Tremor, essential    takes Mysoline and Neurontin daily.   Vocal cord dysfunction    Weakness    in left arm    Past Surgical History:  Procedure Laterality Date   ABDOMINAL HYSTERECTOMY     ABLATION FOR AVNRT     APPENDECTOMY     BARTHOLIN GLAND CYST EXCISION     x 2   BIOPSY  03/04/2015   Procedure: BIOPSY (Duodenal, Gastric);  Surgeon: Daneil Dolin, MD;  Location: [facility];  Service: Endoscopy;;   BIOPSY  12/19/2018   Procedure: BIOPSY;  Surgeon: Daneil Dolin, MD;  [facility];  Service: Endoscopy;;  Gastric Biopsies     BREAST SURGERY     CESAREAN SECTION     CHOLECYSTECTOMY     COLONOSCOPY  01/16/2004   [facility]:[id number] rectum/colon   COLONOSCOPY WITH PROPOFOL N/A 03/04/2015   Dr.Haedyn Ancrum- internal hemorrhoids, pancolonic diverticulosis, colonic polyp= tubular adenoma   DEEP BRAIN STIMULATOR PLACEMENT     DENTAL SURGERY     ESOPHAGEAL DILATION N/A 03/04/2015   Procedure: ESOPHAGEAL DILATION WITH 54FR  MALONEY DILATOR;  Surgeon: Daneil Dolin, MD;  Location: [facility];  Service: Endoscopy;  Laterality: N/A;   ESOPHAGEAL DILATION  12/19/2018   Procedure: ESOPHAGEAL DILATION;  Surgeon: Daneil Dolin, MD;  [facility];  Service: Endoscopy;;   ESOPHAGOGASTRODUODENOSCOPY (EGD) WITH ESOPHAGEAL DILATION  04/03/2002   EXB:[id number] ring, otherwise normal esophagus, status post dilation with 78 F/Normal stomach   ESOPHAGOGASTRODUODENOSCOPY (EGD) WITH ESOPHAGEAL DILATION N/A 11/08/2012   Dr. Daveda Larock:schatzki's ring s/p dilation/hiatal hernia   ESOPHAGOGASTRODUODENOSCOPY (EGD) WITH PROPOFOL N/A 03/04/2015   Dr.Josiah Wojtaszek- schatzki's ring, hiatal hernia, scattered erosions bx= chronic inactive gastritis   ESOPHAGOGASTRODUODENOSCOPY (EGD) WITH PROPOFOL N/A 05/06/2017   Normal esophagus. Dilated. Small hiatal  hernia. Normal duodenal bulb and second portion of duodenum.   ESOPHAGOGASTRODUODENOSCOPY (EGD) WITH PROPOFOL N/A 12/19/2018   Costantino Kohlbeck: Mild [name] ring status post dilation, multiple erosions in the stomach, biopsies negative.   FINGER SURGERY     HEMORRHOID BANDING  2017   Dr.Marijean Montanye   LEFT ANKLE LIGAMENT REPAIR     x 2   LEFT ELBOW REPAIR     MALONEY DILATION N/A 05/06/2017   Procedure: Venia Minks DILATION;  Surgeon: Daneil Dolin, MD;  [facility];  Service: Endoscopy;  Laterality: N/A;   MALONEY DILATION N/A 12/19/2018   Procedure: Venia Minks DILATION;  Surgeon: Daneil Dolin, MD;  [facility];  Service: Endoscopy;  Laterality: N/A;   MAXIMUM ACCESS (MAS)POSTERIOR LUMBAR INTERBODY FUSION (PLIF) 1 LEVEL N/A 04/24/2014   Procedure: Lumbar four-five Maximum access Surgery posterior lumbar interbody fusion;  Surgeon: [name], MD;  Location: [place] [facility];  Service: Neurosurgery;  Laterality: N/A;  Lumbar four-five Maximum access Surgery posterior lumbar interbody fusion   MULTIPLE EXTRACTIONS WITH ALVEOLOPLASTY N/A 06/18/2017   Procedure: MULTIPLE EXTRACTION;  Surgeon: Diona Browner, DDS;  Location: [place] [facility];  Service: Oral Surgery;  Laterality: N/A;   PARTIAL HYSTERECTOMY     POLYPECTOMY  03/04/2015   Procedure: POLYPECTOMY (descending colon);  Surgeon: Daneil Dolin, MD;  [facility];  Service: Endoscopy;;   PULSE GENERATOR IMPLANT Bilateral 12/15/2013   Procedure: BILATERAL PULSE GENERATOR IMPLANT;  Surgeon: [name], MD;  Location: [place] [facility];  Service: Neurosurgery;  Laterality: Bilateral;  BILATERAL   RIGHT BREAST CYST     benign   SUBTHALAMIC STIMULATOR BATTERY REPLACEMENT Bilateral 06/24/2018   Procedure: Bilateral implantable pulse generator battery change;  Surgeon: [name], MD;  Location: [facility];  Service: Neurosurgery;  Laterality: Bilateral;  bilateral   SUBTHALAMIC STIMULATOR INSERTION Bilateral 12/04/2013   Procedure: Bilateral Deep brain  stimulator placement;  Surgeon: [name], MD;  Location: [facility] [facility];  Service: Neurosurgery;  Laterality: Bilateral;  Bilateral Deep brain stimulator placement   TONSILLECTOMY      Prior to Admission medications   Medication Sig Start Date End Date Taking? Authorizing Provider  acetaminophen (TYLENOL) 325 MG tablet Take 2 tablets (650 mg total) by mouth every 6 (six) hours as needed for mild pain (or Fever >/= 101). 10/08/20  Yes Emokpae, [name], MD  albuterol (PROAIR HFA) 108 (90 Base) MCG/ACT inhaler Inhale 2 puffs into the lungs every 6 (six) hours as needed for wheezing or shortness of breath. 10/08/20  Yes Roxan Hockey, MD  aspirin EC 81 MG tablet Take 1 tablet (81 mg total) by mouth daily with breakfast. 10/08/20 10/08/21 Yes Emokpae, [name], MD  Baclofen 5 MG TABS Take 5 mg by mouth at bedtime.   Yes [provider]  Carboxymethylcellulose Sod  PF 0.5 % SOLN Place 1 drop into both eyes 2 (two) times daily.    Yes [provider]  clotrimazole-betamethasone (LOTRISONE) cream Apply 1 application topically 2 (two) times daily. Apply spirally twice daily to perineum and buttock area yeast/fungal rash 10/08/20  Yes Emokpae, [name], MD  diltiazem (CARDIZEM CD) 180 MG 24 hr capsule Take 1 capsule (180 mg total) by mouth daily. 09/06/20  Yes Kathie Dike, MD  divalproex (DEPAKOTE) 500 MG DR tablet Take 500 mg by mouth 3 (three) times daily.    Yes [provider]  fluticasone furoate-vilanterol (BREO ELLIPTA) 200-25 MCG/INH AEPB Inhale 1 puff into the lungs daily. 10/08/20  Yes Emokpae, [name], MD  gabapentin (NEURONTIN) 300 MG capsule Take 300 mg by mouth in the morning.   Yes [provider]  gabapentin (NEURONTIN) 600 MG tablet Take 600 mg by mouth at bedtime.   Yes [provider]  Glucagon, rDNA, (GLUCAGON EMERGENCY) 1 MG KIT Inject as directed. Inject 1 mg intramuscularly every 24 hours as needed for low blood glucose blood glucose below 60 and  unable to take oral glucose replacement.   Yes [provider]  Glucerna (GLUCERNA) LIQD Take 237 mLs by mouth in the morning and at bedtime.   Yes [provider]  guaiFENesin-dextromethorphan (ROBITUSSIN DM) 100-10 MG/5ML syrup Take 5 mLs by mouth every 4 (four) hours as needed for cough.   Yes [provider]  insulin glargine (LANTUS) 100 UNIT/ML injection Inject 15 Units into the skin at bedtime.   Yes [provider]  insulin lispro (HUMALOG) 100 UNIT/ML injection Inject 0-10 Units into the skin 3 (three) times daily before meals. 0-59 call MD 60-200=0 201-250= 2 units 251-300=4 units 301-350= 6 units 351-400= 8 units 401-450= 10 units 451+ Call MD   Yes [provider]  ipratropium (ATROVENT) 0.03 % nasal spray Place 2 sprays into both nostrils 3 (three) times daily before meals.   Yes [provider]  ipratropium-albuterol (DUONEB) 0.5-2.5 (3) MG/3ML SOLN Take 3 mLs by nebulization every 6 (six) hours as needed (wheezing or shortness of breath).   Yes [provider]  lactulose (CHRONULAC) 10 GM/15ML solution Take 10 g by mouth daily as needed for mild constipation.   Yes [provider]  levothyroxine (SYNTHROID) 50 MCG tablet Take 1 tablet (50 mcg total) by mouth daily before breakfast. 09/06/20  Yes Kathie Dike, MD  loperamide (IMODIUM A-D) 2 MG tablet Take 2 mg by mouth every 8 (eight) hours as needed for diarrhea or loose stools.   Yes [provider]  lubiprostone (AMITIZA) 24 MCG capsule Take 1 capsule (24 mcg total) by mouth 2 (two) times daily with a meal. 05/14/20  Yes Mahala Menghini, PA-C  magnesium hydroxide (MILK OF MAGNESIA) 400 MG/5ML suspension Take 30 mLs by mouth at bedtime as needed for mild constipation.    Yes [provider]  Menthol, Topical Analgesic, (BIOFREEZE) 4 % GEL Apply 1 application topically 2 (two) times daily. Apply to lower back   Yes [provider]   metFORMIN (GLUCOPHAGE) 500 MG tablet Take 500 mg by mouth 2 (two) times daily with a meal.   Yes [provider]  metoprolol succinate (TOPROL-XL) 50 MG 24 hr tablet Take 1 tablet (50 mg total) by mouth daily. 10/08/20  Yes Emokpae, [name], MD  nystatin (MYCOSTATIN/NYSTOP) powder Apply 1 application topically every 8 (eight) hours.   Yes [provider]  omega-3 acid ethyl esters (LOVAZA) 1 g capsule  Take 1 g by mouth in the morning.   Yes [provider]  OXYGEN Inhale 2-5 L into the lungs as needed (if resident oxygen is 90% or less).   Yes [provider]  pantoprazole (PROTONIX) 40 MG tablet Take 40 mg by mouth in the morning.   Yes [provider]  Polyethyl Glycol-Propyl Glycol (SYSTANE) 0.4-0.3 % SOLN Place 1 drop into both eyes every 8 (eight) hours as needed (dry eyes).   Yes [provider]  polyethylene glycol (MIRALAX / GLYCOLAX) packet Take 17 g by mouth daily as needed for moderate constipation. Patient taking differently: Take 17 g by mouth every 12 (twelve) hours as needed for moderate constipation. 10/01/17  Yes Pollina, Gwenyth Allegra, MD  potassium chloride SA (K-DUR,KLOR-CON) 20 MEQ tablet Take 20 mEq by mouth in the morning.   Yes [provider]  primidone (MYSOLINE) 50 MG tablet Take 50 mg by mouth in the morning.    Yes [provider]  QUEtiapine (SEROQUEL) 50 MG tablet Take 1 tablet (50 mg total) by mouth at bedtime. 10/08/20  Yes Roxan Hockey, MD  senna (SENOKOT) 8.6 MG TABS tablet Take 2 tablets by mouth daily as needed for mild constipation.   Yes [provider]  sertraline (ZOLOFT) 50 MG tablet Take 50 mg by mouth daily.   Yes [provider]  sitaGLIPtin (JANUVIA) 100 MG tablet Take 100 mg by mouth in the morning.   Yes [provider]  sodium chloride (OCEAN) 0.65 % nasal spray Place 2 sprays into the nose as needed (Rhinitis). Both nostrils   Yes [provider]  sodium chloride 1 g tablet Take 1 g by mouth 2 (two) times daily.   Yes [provider]    Allergies as of 11/19/2020 - Review Complete 11/19/2020  Allergen Reaction Noted   Ace inhibitors Other (See Comments) 09/19/2019   Invokana [canagliflozin] Other (See Comments) 12/05/2014   Losartan Other (See Comments) 01/22/2014   Metformin and related Diarrhea 01/18/2013   Other Other (See Comments) 07/02/2014   Latex Other (See Comments) 07/02/2014   Mucinex [guaifenesin er] Other (See Comments)    Oxycodone-acetaminophen Itching and Other (See Comments) 05/11/2011   Sulfa antibiotics Other (See Comments) 10/03/2019    Family History  Problem Relation Age of Onset   Lung cancer Father        DIED AGE 61 LUNG CA   Alcohol abuse Father    Anxiety disorder Father    Depression Father    COPD Mother        DIED AGE 40,MRSA,COPD,PNEUMONIA   Pneumonia Mother        DIED AGE 40,MRSA,COPD,PNEUMONIA   Supraventricular tachycardia Mother    Anxiety disorder Mother    Depression Mother    Alcohol abuse Mother    Brain cancer Son        Died at age 53   Heart disease Sister        DIED AGE 32, SMOKER,?HEART   COPD Brother        AGE 58   Colon cancer Neg Hx     Social History   Socioeconomic History   Marital status: Legally Separated    Spouse name: Not on file   Number of children: Not on file   Years of education: Not on file   Highest education level: Bachelor's degree (e.g., BA, AB, BS)  Occupational History   Occupation: retired    Comment: Worked as a Therapist, 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  Lives with husband in [place], [place].   1 child died brain tumor at age 38.   Two grandchildren.   Works at [employer], patient currently lost her job.   Retired 2011.   No tobacco.   Alcohol: none in 30 yrs.  Distant history of heavy use.   No drug use.   Social Determinants of Health   Financial Resource Strain: Not on file  Food Insecurity: Not on file  Transportation Needs: Not on file  Physical Activity: Not on file  Stress: Not on file  Social Connections: Not on file  Intimate Partner Violence: Not on file    Review of Systems: See HPI, otherwise negative ROS  Physical Exam: BP 128/72   Pulse 66   Temp (!) 97.5 F (36.4 C)   Ht _0  (1.676 m)   Wt 184 lb (83.5 kg)   BMI 29.70 kg/m  General:   Wheelchair bound.  Greeted me by my name.  Some difficulty with speech.  Pleasant and cooperative in NAD Lungs:  Clear throughout to auscultation.   No wheezes, crackles, or rhonchi. No acute distress. Heart:  Regular rate and rhythm; no murmurs, clicks, rubs,  or gallops. Abdomen: Non-distended, normal bowel sounds.  Soft and nontender without appreciable mass or hepatosplenomegaly.  Pulses:  Normal pulses noted. Extremities:  Without clubbing or edema. Rectal: She has marked erythema both buttocks.  Digital exam reveals empty rectal vault.  No masses.  Scant brown stool is Hemoccult negative.  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  Infrequent bowel movements but it is reported they are often loose when they occur. Spurious diarrhea due to an impaction has been ruled out by DRE today.  She is on multiple agents for both diarrhea and constipation.  It is difficult to assess efficacy of these agents.  She is also taking metformin and baclofen  which could be contributing factors to bowel dysfunction.  Her bowel regimen needs to be simplified.  History of a simple colonic adenoma removed in 2016.  At this time, I do not recommend a surveillance colonoscopy now or in the future as the risks would not outweigh the benefits.  Of course, should the clinical picture change, plan/recommendations may need to be modified.  GERD well-controlled on Protonix.  No dysphagia reported.  Recommendations:  Take 17 g of MiraLAX each day at bedtime if no bowel movement on that particular day  Begin Benefiber 2 tablespoons every day take regardless of bowel function  Stop senna, Amitiza,, loperamide, magnesium hydroxide suspension, Mylanta.  Continue Protonix 40 mg daily for GERD.  Office visit with [place] in 4 months.      Notice: This dictation was prepared with Dragon dictation along with smaller phrase technology. Any  transcriptional errors that result from this process are unintentional and may not be corrected upon review.

## 2020-11-19 NOTE — Patient Instructions (Signed)
You were not impacted today.  There was no blood in your stool.  Your bowel regimen is particularly complicated.  It needs to be simplified.  As discussed, a surveillance colonoscopy is not recommended now or in the future.  Given all of your other associated medical problems, the benefits not likely worth the risks as discussed.  Should new symptoms develop, plans/recommendations could change.  For now:  Take 17 g of MiraLAX each day at bedtime if no bowel movement on that particular day  Begin Benefiber 2 tablespoons every day take regardless of bowel function  Stop senna, Amitiza,, loperamide, magnesium hydroxide suspension, Mylanta.  Continue Protonix 40 mg daily for GERD.  Office visit with [place] in 4 months.

## 2020-12-30 ENCOUNTER — Other Ambulatory Visit: Payer: Self-pay

## 2020-12-30 DIAGNOSIS — I739 Peripheral vascular disease, unspecified: Secondary | ICD-10-CM

## 2021-01-07 ENCOUNTER — Encounter: Payer: Self-pay | Admitting: Vascular Surgery

## 2021-01-07 ENCOUNTER — Ambulatory Visit (INDEPENDENT_AMBULATORY_CARE_PROVIDER_SITE_OTHER): Payer: 59 | Admitting: Vascular Surgery

## 2021-01-07 ENCOUNTER — Ambulatory Visit (HOSPITAL_COMMUNITY)
Admission: RE | Admit: 2021-01-07 | Discharge: 2021-01-07 | Disposition: A | Discharge status: Home / Self Care | Payer: 59 | Source: Ambulatory Visit | Attending: Vascular Surgery | Admitting: Vascular Surgery

## 2021-01-07 ENCOUNTER — Other Ambulatory Visit: Payer: Self-pay

## 2021-01-07 DIAGNOSIS — I739 Peripheral vascular disease, unspecified: Secondary | ICD-10-CM | POA: Diagnosis not present

## 2021-01-07 DIAGNOSIS — S91309A Unspecified open wound, unspecified foot, initial encounter: Secondary | ICD-10-CM

## 2021-01-07 NOTE — Progress Notes (Signed)
Patient name: [name] MRN: [id number] DOB: January 22, 1951 Sex: female  REASON FOR CONSULT: Evaluate bilateral heel wounds  HPI: [name] is a 70 y.o. female, with history of COPD, chronic respiratory failure, hypertension, diabetes that presents for evaluation of bilateral heel wounds.  Patient currently presents from her nursing facility where she is currently a resident.  She was recently hospitalized for pneumonia and respiratory failure with sepsis last month.  States she first noticed these heel wounds several months ago.  She is here with a nurse aide who states she really does not stand or transfer and most of her transfers are with a Harrel Lemon lift.  Denies any previous lower extremity revascularizations.  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 [name], residual palpitations   Bipolar 1 disorder ([place])    Chronic back pain    reason unknown   Constipation    COPD (chronic obstructive pulmonary disease) ([place])    DDD (degenerative disc disease), lumbar    Depression    takes Zoloft daily as well as Cymbalta   Diabetes mellitus    Type II   Dysphagia    Dysrhythmia    SVT ==ablation done by Dr. Rayann Heman 2007   Esophageal reflux    takes Zantac daily   Frequent headaches    History of bronchitis Dec 2014   Hypertension    takes Losartan daily   Hypothyroidism    takes Synthroid daily   Joint swelling    left ankle   Low sodium syndrome    Lumbar radiculopathy    Seizures ([place]) 04-05-13   one time ran out of Primidone and had stopped it cold [place]   SVT (supraventricular tachycardia) ([name])    Tremor, essential     takes Mysoline and Neurontin daily.   Vocal cord dysfunction    Weakness    in left arm    Past Surgical History:  Procedure Laterality Date   ABDOMINAL HYSTERECTOMY     ABLATION FOR AVNRT     APPENDECTOMY     BARTHOLIN GLAND CYST EXCISION     x 2   BIOPSY  03/04/2015   Procedure: BIOPSY (Duodenal, Gastric);  Surgeon: Daneil Dolin, MD;  Location: [facility];  Service: Endoscopy;;   BIOPSY  12/19/2018   Procedure: BIOPSY;  Surgeon: Daneil Dolin, MD;  [facility];  Service: Endoscopy;;  Gastric Biopsies     BREAST SURGERY     CESAREAN SECTION     CHOLECYSTECTOMY     COLONOSCOPY  01/16/2004   HUO:HFGBMS rectum/colon   COLONOSCOPY WITH PROPOFOL N/A 03/04/2015   Dr.[name]- internal hemorrhoids, pancolonic diverticulosis, colonic polyp= tubular adenoma   DEEP BRAIN STIMULATOR PLACEMENT     DENTAL SURGERY     ESOPHAGEAL DILATION N/A 03/04/2015   Procedure: ESOPHAGEAL DILATION WITH 54FR MALONEY DILATOR;  Surgeon: Daneil Dolin, MD;  Location: [facility];  Service: Endoscopy;  Laterality: N/A;   ESOPHAGEAL DILATION  12/19/2018   Procedure: ESOPHAGEAL DILATION;  Surgeon: Daneil Dolin, MD;  [facility];  Service: Endoscopy;;  ESOPHAGOGASTRODUODENOSCOPY (EGD) WITH ESOPHAGEAL DILATION  04/03/2002   ZYS:[id number] ring, otherwise normal esophagus, status post dilation with 56 F/Normal stomach   ESOPHAGOGASTRODUODENOSCOPY (EGD) WITH ESOPHAGEAL DILATION N/A 11/08/2012   Dr. [name]:schatzki's ring s/p dilation/hiatal hernia   ESOPHAGOGASTRODUODENOSCOPY (EGD) WITH PROPOFOL N/A 03/04/2015   Dr.[name]- schatzki's ring, hiatal hernia, scattered erosions bx= chronic inactive gastritis   ESOPHAGOGASTRODUODENOSCOPY (EGD) WITH PROPOFOL N/A 05/06/2017   Normal esophagus. Dilated. Small hiatal hernia. Normal duodenal bulb and second portion of duodenum.   ESOPHAGOGASTRODUODENOSCOPY (EGD) WITH PROPOFOL N/A 12/19/2018   [name]: Mild [name] ring status post dilation, multiple erosions in  the stomach, biopsies negative.   FINGER SURGERY     HEMORRHOID BANDING  2017   Dr.[name]   LEFT ANKLE LIGAMENT REPAIR     x 2   LEFT ELBOW REPAIR     MALONEY DILATION N/A 05/06/2017   Procedure: Venia Minks DILATION;  Surgeon: Daneil Dolin, MD;  [facility];  Service: Endoscopy;  Laterality: N/A;   MALONEY DILATION N/A 12/19/2018   Procedure: Venia Minks DILATION;  Surgeon: Daneil Dolin, MD;  [facility];  Service: Endoscopy;  Laterality: N/A;   MAXIMUM ACCESS (MAS)POSTERIOR LUMBAR INTERBODY FUSION (PLIF) 1 LEVEL N/A 04/24/2014   Procedure: Lumbar four-five Maximum access Surgery posterior lumbar interbody fusion;  Surgeon: [name], MD;  Location: [place] [facility];  Service: Neurosurgery;  Laterality: N/A;  Lumbar four-five Maximum access Surgery posterior lumbar interbody fusion   MULTIPLE EXTRACTIONS WITH ALVEOLOPLASTY N/A 06/18/2017   Procedure: MULTIPLE EXTRACTION;  Surgeon: Diona Browner, DDS;  Location: [place];  Service: Oral Surgery;  Laterality: N/A;   PARTIAL HYSTERECTOMY     POLYPECTOMY  03/04/2015   Procedure: POLYPECTOMY (descending colon);  Surgeon: Daneil Dolin, MD;  [facility];  Service: Endoscopy;;   PULSE GENERATOR IMPLANT Bilateral 12/15/2013   Procedure: BILATERAL PULSE GENERATOR IMPLANT;  Surgeon: [name], MD;  Location: [place] [facility];  Service: Neurosurgery;  Laterality: Bilateral;  BILATERAL   RIGHT BREAST CYST     benign   SUBTHALAMIC STIMULATOR BATTERY REPLACEMENT Bilateral 06/24/2018   Procedure: Bilateral implantable pulse generator battery change;  Surgeon: [name], MD;  Location: [place];  Service: Neurosurgery;  Laterality: Bilateral;  bilateral   SUBTHALAMIC STIMULATOR INSERTION Bilateral 12/04/2013   Procedure: Bilateral Deep brain stimulator placement;  Surgeon: [name], MD;  Location: [place] [facility];  Service: Neurosurgery;  Laterality: Bilateral;  Bilateral Deep brain stimulator placement   TONSILLECTOMY      Family  History  Problem Relation Age of Onset   Lung cancer Father        DIED AGE 68 LUNG CA   Alcohol abuse Father    Anxiety disorder Father    Depression Father    COPD Mother        DIED AGE 74,MRSA,COPD,PNEUMONIA   Pneumonia Mother        DIED AGE 74,MRSA,COPD,PNEUMONIA   Supraventricular tachycardia Mother    Anxiety disorder Mother    Depression Mother    Alcohol abuse Mother    Brain cancer Son        Died at age 53   Heart disease Sister        DIED AGE 67, SMOKER,?HEART   COPD Brother        AGE 13   Colon cancer Neg Hx     SOCIAL HISTORY: Social History   Socioeconomic History   Marital status: Legally Separated    Spouse name: Not on file  Number of children: Not on file   Years of education: Not on file   Highest education level: Bachelor's degree (e.g., BA, AB, BS)  Occupational History   Occupation: retired    Comment: Worked as a Therapist, 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  Lives with husband in [place], [place].   1 child died brain tumor at age 35.   Two grandchildren.   Works at [employer], patient currently lost her job.   Retired 2011.   No tobacco.   Alcohol: none in 30 yrs.  Distant history of heavy use.   No drug use.   Social Determinants of Health   Financial Resource Strain: Not on file  Food Insecurity: Not on file  Transportation Needs: Not on file  Physical Activity: Not on file  Stress: Not on file  Social Connections: Not on file   Intimate Partner Violence: Not on file    Allergies  Allergen Reactions   Ace Inhibitors Other (See Comments)    History of angioedema with losartan, ARB   Invokana [Canagliflozin] Other (See Comments)    Yeast infectios   Losartan Other (See Comments)    Angioedema    Metformin And Related Diarrhea   Other Other (See Comments)    Air fresheners - on MAR   Latex Other (See Comments)    Other Reaction: redness, blisters   Mucinex [Guaifenesin Er] Other (See Comments)    MUCINEX REACTION: asthma attacks   Oxycodone-Acetaminophen Itching and Other (See Comments)    TYLOX. Caused internal itching per patient    Sulfa Antibiotics Other (See Comments)    Current Outpatient Medications  Medication Sig Dispense Refill   acetaminophen (TYLENOL) 325 MG tablet Take 2 tablets (650 mg total) by mouth every 6 (six) hours as needed for mild pain (or Fever >/= 101). 30 tablet 1   albuterol (PROAIR HFA) 108 (90 Base) MCG/ACT inhaler Inhale 2 puffs into the lungs every 6 (six) hours as needed for wheezing or shortness of breath. 18 g 1   aspirin EC 81 MG tablet Take 1 tablet (81 mg total) by mouth daily with breakfast. 30 tablet 2   Baclofen 5 MG TABS Take 5 mg by mouth at bedtime.     Carboxymethylcellulose Sod PF 0.5 % SOLN Place 1 drop into both eyes 2 (two) times daily.      clotrimazole-betamethasone (LOTRISONE) cream Apply 1 application topically 2 (two) times daily. Apply spirally twice daily to perineum and buttock area yeast/fungal rash 30 g 0   diltiazem (CARDIZEM CD) 180 MG 24 hr capsule Take 1 capsule (180 mg total) by mouth daily.     divalproex (DEPAKOTE) 500 MG DR tablet Take 500 mg by mouth 3 (three) times daily.      fluticasone furoate-vilanterol (BREO ELLIPTA) 200-25 MCG/INH AEPB Inhale 1 puff into the lungs daily.  28 each 11   gabapentin (NEURONTIN) 300 MG capsule Take 300 mg by mouth in the morning.     gabapentin (NEURONTIN) 600 MG tablet Take 600 mg by mouth at bedtime.      Glucagon, rDNA, (GLUCAGON EMERGENCY) 1 MG KIT Inject as directed. Inject 1 mg intramuscularly every 24 hours as needed for low blood glucose blood glucose below 60 and unable to take oral glucose replacement.     Glucerna (GLUCERNA) LIQD Take 237 mLs by mouth in the morning and at bedtime.     guaiFENesin-dextromethorphan (ROBITUSSIN DM) 100-10 MG/5ML syrup Take 5 mLs by mouth every 4 (four) hours as needed for cough.     insulin glargine (LANTUS) 100 UNIT/ML injection Inject 15 Units into the skin at bedtime.     insulin lispro (HUMALOG) 100 UNIT/ML injection Inject 0-10 Units into the skin 3 (three) times daily before meals. 0-59 call MD 60-200=0 201-250= 2 units 251-300=4 units 301-350= 6 units 351-400= 8 units 401-450= 10 units 451+ Call MD     ipratropium (ATROVENT) 0.03 % nasal spray Place 2 sprays into both nostrils 3 (three) times daily before meals.     ipratropium-albuterol (DUONEB) 0.5-2.5 (3) MG/3ML SOLN Take 3 mLs by nebulization every 6 (six) hours as needed (wheezing or shortness of breath).     lactulose (CHRONULAC) 10 GM/15ML solution Take 10 g by mouth daily as needed for mild constipation.     levothyroxine (SYNTHROID) 50 MCG tablet Take 1 tablet (50 mcg total) by mouth daily before breakfast.     loperamide (IMODIUM A-D) 2 MG tablet Take 2 mg by mouth every 8 (eight) hours as needed for diarrhea or loose stools.     lubiprostone (AMITIZA) 24 MCG capsule Take 1 capsule (24 mcg total) by mouth 2 (two) times daily with a meal. 60 capsule 5   magnesium hydroxide (MILK OF MAGNESIA) 400 MG/5ML suspension Take 30 mLs by mouth at bedtime as needed for mild constipation.      Menthol, Topical Analgesic, (BIOFREEZE) 4 % GEL Apply 1 application topically 2 (two) times daily. Apply to lower back     metFORMIN (GLUCOPHAGE) 500 MG tablet Take 500 mg by mouth 2 (two) times daily with a meal.     metoprolol succinate (TOPROL-XL) 50 MG 24 hr tablet Take 1 tablet (50 mg total) by mouth  daily. 30 tablet 3   nystatin (MYCOSTATIN/NYSTOP) powder Apply 1 application topically every 8 (eight) hours.     omega-3 acid ethyl esters (LOVAZA) 1 g capsule Take 1 g by mouth in the morning.     OXYGEN Inhale 2-5 L into the lungs as needed (if resident oxygen is 90% or less).     pantoprazole (PROTONIX) 40 MG tablet Take 40 mg by mouth in the morning.     Polyethyl Glycol-Propyl Glycol (SYSTANE) 0.4-0.3 % SOLN Place 1 drop into both eyes every 8 (eight) hours as needed (dry eyes).     polyethylene glycol (MIRALAX / GLYCOLAX) packet Take 17 g by mouth daily as needed for moderate constipation. (Patient taking differently: Take 17 g by mouth every 12 (twelve) hours as needed for moderate constipation.) 14 each 0   potassium chloride SA (K-DUR,KLOR-CON) 20 MEQ tablet Take 20 mEq by mouth in the morning.     primidone (MYSOLINE) 50 MG tablet Take 50 mg by mouth in the morning.      QUEtiapine (SEROQUEL) 50 MG tablet Take 1 tablet (50 mg total) by mouth at bedtime. 30 tablet  1   senna (SENOKOT) 8.6 MG TABS tablet Take 2 tablets by mouth daily as needed for mild constipation.     sertraline (ZOLOFT) 50 MG tablet Take 50 mg by mouth daily.     sitaGLIPtin (JANUVIA) 100 MG tablet Take 100 mg by mouth in the morning.     sodium chloride (OCEAN) 0.65 % nasal spray Place 2 sprays into the nose as needed (Rhinitis). Both nostrils     sodium chloride 1 g tablet Take 1 g by mouth 2 (two) times daily.     No current facility-administered medications for this visit.    REVIEW OF SYSTEMS:  _0  denotes positive finding, _1  denotes negative finding Cardiac  Comments:  Chest pain or chest pressure:    Shortness of breath upon exertion:    Short of breath when lying flat:    Irregular heart rhythm:        Vascular    Pain in calf, thigh, or hip brought on by ambulation:    Pain in feet at night that wakes you up from your sleep:     Blood clot in your veins:    Leg swelling:         Pulmonary     Oxygen at home:    Productive cough:     Wheezing:         Neurologic    Sudden weakness in arms or legs:     Sudden numbness in arms or legs:     Sudden onset of difficulty speaking or slurred speech:    Temporary loss of vision in one eye:     Problems with dizziness:         Gastrointestinal    Blood in stool:     Vomited blood:         Genitourinary    Burning when urinating:     Blood in urine:        Psychiatric    Major depression:         Hematologic    Bleeding problems:    Problems with blood clotting too easily:        Skin    Rashes or ulcers:        Constitutional    Fever or chills:      PHYSICAL EXAM: Vitals:   01/07/21 1146  BP: 108/63  Pulse: 82  Resp: 14  Temp: 98 F (36.7 C)  TempSrc: Temporal  SpO2: 97%  Weight: 184 lb (83.5 kg)  Height: _2  (1.6 m)    GENERAL: The patient is a well-nourished female, in no acute distress. The vital signs are documented above. CARDIAC: There is a regular rate and rhythm.  VASCULAR:  Femoral pulses are palpable bilateral bilateral Bilateral dorsalis pedis pulses are palpable Bilateral heel wounds PULMONARY: No respiratory distress ABDOMEN: Soft and non-tender  MUSCULOSKELETAL: There are no major deformities or cyanosis. NEUROLOGIC: No focal weakness or paresthesias are detected. SKIN: There are no ulcers or rashes noted. PSYCHIATRIC: The patient has a normal affect.  DATA:   ABIs are noncompressible but she does have biphasic waveforms at the right ankle and triphasic waveforms at the left ankle with pulsatile toe tracings  Assessment/Plan:  70 year old female with multiple medical comorbidities who presents for evaluation of bilateral heel wounds.  She was recently hospitalized for pneumonia with respiratory failure and sepsis and is now residing in a SNF.  According to the nurse aide she really does not stand or transfer and  all of her transfers are with a [facility] lift.  I suspect her heel wounds  are pressure related as I discussed with her today.  Even though her ABIs are noncompressible she has fairly normal waveforms at the ankle and I can appreciate dorsalis pedis pulses on exam.  I do not think she needs any vascular intervention and these do not appear to be primary arterial insufficiency as the etiology.  I think she needs to continue to offload her heels with good wound care as I discussed today.  She can follow-up with me as needed.   Marty Heck, MD [facility] Office: [phone]

## 2021-03-06 ENCOUNTER — Ambulatory Visit: Payer: 59 | Admitting: Neurology

## 2021-03-11 NOTE — Progress Notes (Signed)
Assessment/Plan:    1.  Essential Tremor.             -This is evidenced by the symmetrical nature and longstanding hx of gradually getting worse.             -The patient is status post bilateral VIM DBS on 12/04/2013 with bilateral generator placement on 12/15/2013 and 06/24/18.  Postoperative imaging indicates that leads are perhaps just slightly too lateral and more inferior than anticipated.  Her tremor is clearly not adequately controlled, but she is not a candidate to go back in for revision.             -I was worried that the device was contributing to mood change.  However, the device was turned off for 5 weeks and the facility reports that her mood was not only not better, but that it was worse.  In addition, tremor was markedly worse.               -Reiterated to the patient that Depakote and quetiapine both can contribute to tremor.             -only on primidone 50 mg daily.  Wants to stay on that.  -Patient on Ingrezza currently.  It is unclear why she is.  Records since our last visit indicate that patient has a history of tardive dyskinesia.  I do not see that on examination.  She is not on any medicines that should cause that.  She is on quetiapine, but only 12.5 mg at night and that certainly should not cause this.  I did send a note back to the facility asking for explanation. 2.  Diabetic peripheral neuropathy             -off of gabapentin per records today. 3.  L4 radiculopathy             -S/P surgery   -Appears her opioids were tapered off after presenting to the hospital with mental status change. 4.  Multifactorial balance d/o             -Patient no longer ambulates. Subjective:   [name] was seen today in follow up for essential tremor.  My previous records were reviewed prior to todays visit. Pt has not followed up in our clinic for 2-1/2 years.  She was in the emergency room in June for a fall, which occurred during a transfer.  CT brain imaging was  unremarkable intracranially.  She saw vascular surgery in October, and I reviewed those notes in detail.  She presented with bilateral heel wounds.  According to those notes, patient does not stand or transfer and all transfers are now with a [facility] lift.  Heel wounds were felt to be pressure ulcers.  No intervention was recommended, with the exception of keeping pressure off of the heels.  Pt comes in today on ingrezza, unclear why.  Records state in June, 2021 that she had history of tardive dyskinesia (I had never seen that previously).  She was in the hospital in June, 2021 with mental status change.  It appears that this is due to medications.  It appears that her opioid medications have been titrated off since that time.   ALLERGIES:   Allergies  Allergen Reactions   Ace Inhibitors Other (See Comments)    History of angioedema with losartan, ARB   Invokana [Canagliflozin] Other (See Comments)    Yeast infectios   Losartan Other (See Comments)  Angioedema    Metformin And Related Diarrhea   Other Other (See Comments)    Air fresheners - on MAR   Latex Other (See Comments)    Other Reaction: redness, blisters   Mucinex [Guaifenesin Er] Other (See Comments)    MUCINEX REACTION: asthma attacks   Oxycodone-Acetaminophen Itching and Other (See Comments)    TYLOX. Caused internal itching per patient    Sulfa Antibiotics Other (See Comments)    CURRENT MEDICATIONS:  Outpatient Encounter Medications as of 03/13/2021  Medication Sig   acetaminophen (TYLENOL) 325 MG tablet Take 2 tablets (650 mg total) by mouth every 6 (six) hours as needed for mild pain (or Fever >/= 101).   albuterol (PROAIR HFA) 108 (90 Base) MCG/ACT inhaler Inhale 2 puffs into the lungs every 6 (six) hours as needed for wheezing or shortness of breath.   aspirin EC 81 MG tablet Take 1 tablet (81 mg total) by mouth daily with breakfast.   Baclofen 5 MG TABS Take 5 mg by mouth at bedtime.   Carboxymethylcellulose Sod PF  0.5 % SOLN Place 1 drop into both eyes 2 (two) times daily.    divalproex (DEPAKOTE) 500 MG DR tablet Take 500 mg by mouth 3 (three) times daily.    Glucerna (GLUCERNA) LIQD Take 237 mLs by mouth in the morning and at bedtime.   guaiFENesin-dextromethorphan (ROBITUSSIN DM) 100-10 MG/5ML syrup Take 5 mLs by mouth every 4 (four) hours as needed for cough.   INGREZZA 40 MG capsule Take 40 mg by mouth daily.   ipratropium (ATROVENT) 0.03 % nasal spray Place 2 sprays into both nostrils 3 (three) times daily before meals.   ipratropium-albuterol (DUONEB) 0.5-2.5 (3) MG/3ML SOLN Take 3 mLs by nebulization every 6 (six) hours as needed (wheezing or shortness of breath).   lactulose (CHRONULAC) 10 GM/15ML solution Take 10 g by mouth daily as needed for mild constipation.   loperamide (IMODIUM A-D) 2 MG tablet Take 2 mg by mouth every 8 (eight) hours as needed for diarrhea or loose stools.   magnesium hydroxide (MILK OF MAGNESIA) 400 MG/5ML suspension Take 30 mLs by mouth at bedtime as needed for mild constipation.    Menthol, Topical Analgesic, (BIOFREEZE) 4 % GEL Apply 1 application topically 2 (two) times daily. Apply to lower back   metFORMIN (GLUCOPHAGE) 500 MG tablet Take 500 mg by mouth 2 (two) times daily with a meal.   metoprolol succinate (TOPROL-XL) 50 MG 24 hr tablet Take 1 tablet (50 mg total) by mouth daily.   metoprolol tartrate (LOPRESSOR) 50 MG tablet Take 50 mg by mouth 2 (two) times daily.   nystatin (MYCOSTATIN/NYSTOP) powder Apply 1 application topically every 8 (eight) hours.   OXYGEN Inhale 2-5 L into the lungs as needed (if resident oxygen is 90% or less).   primidone (MYSOLINE) 50 MG tablet Take 50 mg by mouth in the morning.    QUEtiapine (SEROQUEL) 50 MG tablet Take 1 tablet (50 mg total) by mouth at bedtime.   senna (SENOKOT) 8.6 MG TABS tablet Take 2 tablets by mouth daily as needed for mild constipation.   sertraline (ZOLOFT) 50 MG tablet Take 50 mg by mouth daily.   sodium  chloride (OCEAN) 0.65 % nasal spray Place 2 sprays into the nose as needed (Rhinitis). Both nostrils   sodium chloride 1 g tablet Take 1 g by mouth 2 (two) times daily.   clotrimazole-betamethasone (LOTRISONE) cream Apply 1 application topically 2 (two) times daily. Apply spirally twice daily to  perineum and buttock area yeast/fungal rash   diltiazem (CARDIZEM CD) 180 MG 24 hr capsule Take 1 capsule (180 mg total) by mouth daily.   fluticasone furoate-vilanterol (BREO ELLIPTA) 200-25 MCG/INH AEPB Inhale 1 puff into the lungs daily.   gabapentin (NEURONTIN) 300 MG capsule Take 300 mg by mouth in the morning. (Patient not taking: Reported on 03/13/2021)   gabapentin (NEURONTIN) 600 MG tablet Take 600 mg by mouth at bedtime.   Glucagon, rDNA, (GLUCAGON EMERGENCY) 1 MG KIT Inject as directed. Inject 1 mg intramuscularly every 24 hours as needed for low blood glucose blood glucose below 60 and unable to take oral glucose replacement.   insulin glargine (LANTUS) 100 UNIT/ML injection Inject 15 Units into the skin at bedtime.   insulin lispro (HUMALOG) 100 UNIT/ML injection Inject 0-10 Units into the skin 3 (three) times daily before meals. 0-59 call MD 60-200=0 201-250= 2 units 251-300=4 units 301-350= 6 units 351-400= 8 units 401-450= 10 units 451+ Call MD   levothyroxine (SYNTHROID) 50 MCG tablet Take 1 tablet (50 mcg total) by mouth daily before breakfast.   lubiprostone (AMITIZA) 24 MCG capsule Take 1 capsule (24 mcg total) by mouth 2 (two) times daily with a meal.   omega-3 acid ethyl esters (LOVAZA) 1 g capsule Take 1 g by mouth in the morning. (Patient not taking: Reported on 03/13/2021)   pantoprazole (PROTONIX) 40 MG tablet Take 40 mg by mouth in the morning.   Polyethyl Glycol-Propyl Glycol (SYSTANE) 0.4-0.3 % SOLN Place 1 drop into both eyes every 8 (eight) hours as needed (dry eyes).   polyethylene glycol (MIRALAX / GLYCOLAX) packet Take 17 g by mouth daily as needed for moderate  constipation. (Patient taking differently: Take 17 g by mouth every 12 (twelve) hours as needed for moderate constipation.)   potassium chloride SA (K-DUR,KLOR-CON) 20 MEQ tablet Take 20 mEq by mouth in the morning. (Patient not taking: Reported on 03/13/2021)   sitaGLIPtin (JANUVIA) 100 MG tablet Take 100 mg by mouth in the morning. (Patient not taking: Reported on 03/13/2021)   sulfamethoxazole-trimethoprim (BACTRIM DS) 800-160 MG tablet Take 1 tablet by mouth daily. (Patient not taking: Reported on 03/13/2021)   No facility-administered encounter medications on file as of 03/13/2021.     Objective:    PHYSICAL EXAMINATION:    VITALS:   Vitals:   03/13/21 1031  BP: 127/62  Pulse: 82  SpO2: 91%    GEN:  The patient appears stated age and is in NAD. HEENT:  Normocephalic, atraumatic.  The mucous membranes are moist.   Neurological examination:  Orientation: The patient is alert and oriented x3. Cranial nerves: There is good facial symmetry. The speech is fluent but somewhat dysarthric. Soft palate rises symmetrically and there is no tongue deviation. Hearing is intact to conversational tone. Sensation: Sensation is intact to light touch throughout Motor: Strength is at least antigravity x4.  Movement examination: Tone: There is normal tone in the UE/LE Gait and Station: Patient is unable to ambulate I have reviewed and interpreted the following labs independently   Chemistry      Component Value Date/Time   NA 135 10/07/2020 0600   NA 136 (A) 09/21/2019 0000   K 3.7 10/07/2020 0600   CL 98 10/07/2020 0600   CO2 31 10/07/2020 0600   BUN 5 (L) 10/07/2020 0600   BUN 12 09/21/2019 0000   CREATININE 0.37 (L) 10/07/2020 0600   CREATININE 0.70 12/27/2012 1006   GLU 166 09/21/2019 0000  Component Value Date/Time   CALCIUM 8.1 (L) 10/07/2020 0600   ALKPHOS 47 10/05/2020 0346   AST 16 10/05/2020 0346   ALT 8 10/05/2020 0346   BILITOT 0.5 10/05/2020 0346      Lab  Results  Component Value Date   WBC 6.0 10/08/2020   HGB 9.1 (L) 10/08/2020   HCT 28.0 (L) 10/08/2020   MCV 107.3 (H) 10/08/2020   PLT 103 (L) 10/08/2020   Lab Results  Component Value Date   TSH 0.060 (L) 09/04/2020     Chemistry      Component Value Date/Time   NA 135 10/07/2020 0600   NA 136 (A) 09/21/2019 0000   K 3.7 10/07/2020 0600   CL 98 10/07/2020 0600   CO2 31 10/07/2020 0600   BUN 5 (L) 10/07/2020 0600   BUN 12 09/21/2019 0000   CREATININE 0.37 (L) 10/07/2020 0600   CREATININE 0.70 12/27/2012 1006   GLU 166 09/21/2019 0000      Component Value Date/Time   CALCIUM 8.1 (L) 10/07/2020 0600   ALKPHOS 47 10/05/2020 0346   AST 16 10/05/2020 0346   ALT 8 10/05/2020 0346   BILITOT 0.5 10/05/2020 0346        Cc:  Abran Richard, MD

## 2021-03-13 ENCOUNTER — Ambulatory Visit (INDEPENDENT_AMBULATORY_CARE_PROVIDER_SITE_OTHER): Payer: 59 | Admitting: Neurology

## 2021-03-13 ENCOUNTER — Other Ambulatory Visit: Payer: Self-pay

## 2021-03-13 ENCOUNTER — Encounter: Payer: Self-pay | Admitting: Neurology

## 2021-03-13 DIAGNOSIS — G250 Essential tremor: Secondary | ICD-10-CM

## 2021-03-13 NOTE — Progress Notes (Signed)
Patient ID: [name], female    DOB: Nov 28, 1950, 70 y.o.   MRN: [id number]  HPI  female former smoker followed for Allergic rhinitis, Asthma, complicated by anxiety/VCD, CAD, GERD, DM2, tachycardia, tremor/deep brain stimulator, HBP, now living in assisted living Allergy Profile 01/10/2013-total IgE 166 with significant elevations for most common allergens except molds She had been on allergy vaccine years ago. Office Spirometry 12/13/2014-mild obstruction, mild restriction of forced vital capacity Upper endoscopy 2016-[name]'s ring and gastric erosions ----------------------------------------------------------   02/01/20- 70 year old female former smoker followed for Allergic rhinitis, Asthma, complicated by anxiety/VCD, CAD, GERD/ LPR, DM, tachycardia, tremor/deep brain stimulator, BiPolar,  HBP, now living in assisted living/ [place], Covid infection 09/2019,  Hosp-June- altered mental status due to polypharmacy, also Covid infection ED in July for rash and in August for R sided pain after a fall 2 weeks prior.  Covid vax- 2 Moderna Flu vax- had Albuterol hfa, Breo 100, tessalon perles 100,  With Fall weather has been using neb about 3x daily, with only clear mucus. Doesn't feel sick. I can't tell that she recognizes any residual from reported Covid infection earlier this summer.We discussed meds. Reports f/u CXR done yesterday.  CXR/ ribs 11/19/19-  IMPRESSION: 1. No acute or active cardiopulmonary disease. 2. No evidence of rib fracture.  03/14/21- 70 year old female former smoker followed for Allergic rhinitis, Asthma, complicated by anxiety/VCD, CAD, GERD/ LPR, DM, tachycardia, tremor/deep brain stimulator, BiPolar,  HBP, now living in assisted living/ [address] [place], Covid infection 09/2019,   Covid vax- 2 Moderna Flu vax-  -Albuterol hfa, Breo 100, tessalon perles 100,  Hosp [facility] 7/2-5/22- Asp pneumonia, COPD exacerbation,    GI Dr Gala Romney follows She keeps  a cough routinely and describes scant clear sputum with no major exacerbations since hospitalization in July.  She has a nebulizer machine which she says she can manage herself, but has not been using.  She does not remember having a Flutter for airway clearance. Using Robitussin for cough control and I explained that we do not want to suppress cough excessively-important to clear her airways. CTa chest 10/05/20- IMPRESSION: 1. Interval worsening (compared to CT chest 09/02/2020) multifocal pneumonia/aspiration pneumonia with underlying malignancy not excluded. 2. Nodular density noted on chest x-ray correlates nodular-like airspace densities within the dependent right upper lobe. 3. No pulmonary embolus.   ROS-see HPI    "+" = positive Constitutional:    weight loss, night sweats, fevers, chills, fatigue, lassitude. HEENT:    headaches, difficulty swallowing, tooth/dental problems, sore throat,       sneezing, itching, ear ache, nasal congestion, +post nasal drip, snoring CV:    chest pain, orthopnea, PND, swelling in lower extremities, anasarca,              dizziness, palpitations Resp:   +shortness of breath with exertion or at rest.                +productive cough,    non-productive cough, coughing up of blood.              change in color of mucus.  +wheezing.   Skin:    rash or lesions. GI:  + heartburn, indigestion, abdominal pain, nausea, vomiting, GU:  MS:   joint pain, stiffness, decreased range of motion, back pain. Neuro-    + chronic tremor, +unsteady/ walker Psych:  change in mood or affect.  +depression or anxiety.   memory loss.  OBJ- Physical Exam General- Alert, Oriented, Affect-appropriate, Distress-  none acute, + wheelchair talkative,+obese,           Skin- rash-none, lesions- none, excoriation- none Lymphadenopathy- none Head- atraumatic            Eyes- Gross vision intact, PERRLA, conjunctivae and secretions clear            Ears- Hearing, canals-normal             Nose- Clear, no-Septal dev, mucus, polyps, erosion, perforation             Throat- Mallampati II , mucosa -tongue slightly coated , drainage- none, tonsils- atrophic,  + missing teeth    Neck- flexible , trachea midline, no stridor , thyroid nl, carotid no bruit Chest - symmetrical excursion , unlabored           Heart/CV- RRR , no murmur , no gallop  , no rub, nl s1 s2                           - JVD- none , edema- none, stasis changes- none, varices- none           Lung-  unlabored, wheeze -none, cough+mild bronchitic , dullness-none, rub- none,            Chest wall-+ pacemaker  (deep brain stimulator) left Abd-  Br/ Gen/ Rectal- Not done, not indicated Extrem- cyanosis- none, clubbing, none, atrophy- none, strength- nl,  Neuro- + tremor and head bob,+ some dysarthria- I had to ask her to repeat several timess,

## 2021-03-13 NOTE — Procedures (Signed)
DBS Programming was performed.    Manufacturer of DBS device: [employer]onic  Total time spent programming was 25 minutes.  Device was confirmed to be on.  Soft start was confirmed to be on.  Impedences were checked and were within normal limits.  Battery was checked and was determined to be functioning normally and not near the end of life.  Final settings were as follows:   Active Contact Amplitude  PW (ms) Frequency (hz) Side Effects Battery  Left Brain        03/13/21 1-C+ 3.6V 60 130  2.87                          Right Brain        03/13/21 3-2+ 4.1 90 150  2.77

## 2021-03-14 ENCOUNTER — Ambulatory Visit (INDEPENDENT_AMBULATORY_CARE_PROVIDER_SITE_OTHER): Payer: 59

## 2021-03-14 ENCOUNTER — Ambulatory Visit (INDEPENDENT_AMBULATORY_CARE_PROVIDER_SITE_OTHER): Payer: 59 | Admitting: Internal Medicine

## 2021-03-14 ENCOUNTER — Telehealth: Payer: Self-pay | Admitting: Internal Medicine

## 2021-03-14 ENCOUNTER — Encounter: Payer: Self-pay | Admitting: Internal Medicine

## 2021-03-14 VITALS — BP 124/70 | HR 78 | Temp 98.3°F

## 2021-03-14 DIAGNOSIS — R059 Cough, unspecified: Secondary | ICD-10-CM

## 2021-03-14 DIAGNOSIS — J45909 Unspecified asthma, uncomplicated: Secondary | ICD-10-CM | POA: Diagnosis not present

## 2021-03-14 DIAGNOSIS — J4540 Moderate persistent asthma, uncomplicated: Secondary | ICD-10-CM | POA: Diagnosis not present

## 2021-03-14 DIAGNOSIS — J690 Pneumonitis due to inhalation of food and vomit: Secondary | ICD-10-CM

## 2021-03-14 NOTE — Patient Instructions (Signed)
Order- CXR   dx chronic bronchitis  Order- Flutter device     blow through 3 times per set, 2 sets daily  Please call if we can help

## 2021-03-14 NOTE — Assessment & Plan Note (Signed)
Chronic problem.  Hospitalized in July, 2022.  Probable continuous microaspiration. Plan-emphasis on maintaining pulmonary toilet measures.  Resume use of nebulizer machine.  Add flutter device.  She would not be able to manage a pneumatic vest herself and I am not sure that is a practical option for her currently.

## 2021-03-14 NOTE — Assessment & Plan Note (Signed)
I have recommended she begin using her nebulizer machine with ipratropium-albuterol on a twice daily regular basis.  She says that she can manage this herself.  We will also try to get her a Flutter to facilitate airway clearance.

## 2021-03-14 NOTE — Telephone Encounter (Signed)
Received call report from [facility] with [facility] on patient's cxr done on 03/14/21. Dr. Annamaria Boots,  please review the result/impression copied below:   IMPRESSION: Small right pleural effusion and adjacent new right medial basilar consolidation, which could be atelectasis or infection.

## 2021-03-18 ENCOUNTER — Telehealth: Payer: Self-pay | Admitting: Internal Medicine

## 2021-03-18 MED ORDER — AMOXICILLIN-POT CLAVULANATE 500-125 MG PO TABS: 1.0000 | ORAL_TABLET | Freq: Two times a day (BID) | ORAL | 0 refills | Status: AC

## 2021-03-18 NOTE — Telephone Encounter (Signed)
Called [facility] but she did not answer. Left message for her to call back.

## 2021-03-18 NOTE — Telephone Encounter (Signed)
Will call [facility] back in the morning since it is after 5pm and their office is already closed for the day.

## 2021-03-18 NOTE — Telephone Encounter (Signed)
Lacie Scotts is returning phone call. [facility] phone number is [phone].

## 2021-03-18 NOTE — Telephone Encounter (Signed)
[name], [place]  03/18/2021 10:13 AM EST     Spoke with the pt's nurse at [facility] and gave results. I have sent augmentin.    Deneise Lever, MD  03/14/2021  3:57 PM EST     CXR- small right pleural effusion with adjacent dense area that could be a pneumonia.   Order- augmentin 500 mg, # 14, 1 twice daily- to drug store used by [facility]

## 2021-03-18 NOTE — Progress Notes (Signed)
Spoke with the pt's nurse at [facility] and gave results. I have sent augmentin.

## 2021-03-19 ENCOUNTER — Ambulatory Visit (INDEPENDENT_AMBULATORY_CARE_PROVIDER_SITE_OTHER): Payer: 59 | Admitting: Gastroenterology

## 2021-03-19 ENCOUNTER — Other Ambulatory Visit: Payer: Self-pay

## 2021-03-19 ENCOUNTER — Encounter: Payer: Self-pay | Admitting: Gastroenterology

## 2021-03-19 VITALS — BP 103/72 | HR 84 | Temp 96.8°F | Ht 63.0 in | Wt 148.0 lb

## 2021-03-19 DIAGNOSIS — K5900 Constipation, unspecified: Secondary | ICD-10-CM

## 2021-03-19 NOTE — Patient Instructions (Signed)
I would like for you to take Miralax every day, not just as needed.  I have asked that they hold this for the days you have diarrhea.  We will see you in 3 months!  Have a Merry Christmas!  I enjoyed seeing you again today! As you know, I value our relationship and want to provide genuine, compassionate, and quality care. I welcome your feedback. If you receive a survey regarding your visit,  I greatly appreciate you taking time to fill this out. See you next time!  Annitta Needs, PhD, ANP-BC [facility]

## 2021-03-19 NOTE — Progress Notes (Signed)
Referring Provider: Abran Richard, MD Primary Care Physician:  Abran Richard, MD Primary GI: Dr. Gala Romney  Chief Complaint  Patient presents with   Constipation   Diarrhea    HPI:   [name] is a 70 y.o. female presenting today with a history of GERD and constipation. Resides I nursing home. History of adenoma in 2016, with no surveillance planned barring any clinical changes.   She is in a wheelchair. Notes no BM for 4 days at a time. Benefiber daily, Lactulose 15 ml daily prn, Miralax daily prn. No abdominal pain. No N/V. No dysphagia. Multiple other agents have been trialed for constipation without ideal response.    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 [name], residual palpitations   Bipolar 1 disorder ([place])    Chronic back pain    reason unknown   Constipation    COPD (chronic obstructive pulmonary disease) ([place])    DDD (degenerative disc disease), lumbar    Depression    takes Zoloft daily as well as Cymbalta   Diabetes mellitus    Type II   Dysphagia    Dysrhythmia    SVT ==ablation done by Dr. Rayann Heman 2007   Esophageal reflux    takes Zantac daily   Frequent headaches    History of bronchitis Dec 2014   Hypertension    takes Losartan daily   Hypothyroidism    takes Synthroid daily   Joint swelling    left ankle   Low sodium syndrome    Lumbar radiculopathy    Seizures ([place]) 04-05-13   one time ran out of Primidone and had stopped it cold [place]   SVT (supraventricular tachycardia) ([name])    Tremor, essential    takes Mysoline and Neurontin daily.   Vocal cord dysfunction    Weakness    in left arm     Past Surgical History:  Procedure Laterality Date   ABDOMINAL HYSTERECTOMY     ABLATION FOR AVNRT     APPENDECTOMY     BARTHOLIN GLAND CYST EXCISION     x 2   BIOPSY  03/04/2015   Procedure: BIOPSY (Duodenal, Gastric);  Surgeon: Daneil Dolin, MD;  Location: [facility];  Service: Endoscopy;;   BIOPSY  12/19/2018   Procedure: BIOPSY;  Surgeon: Daneil Dolin, MD;  [facility];  Service: Endoscopy;;  Gastric Biopsies     BREAST SURGERY     CESAREAN SECTION     CHOLECYSTECTOMY     COLONOSCOPY  01/16/2004   VXY:[id number] rectum/colon   COLONOSCOPY WITH PROPOFOL N/A 03/04/2015   Dr.[name]- internal hemorrhoids, pancolonic diverticulosis, colonic polyp= tubular adenoma   DEEP BRAIN STIMULATOR PLACEMENT     DENTAL SURGERY     ESOPHAGEAL DILATION N/A 03/04/2015   Procedure: ESOPHAGEAL DILATION WITH 54FR MALONEY DILATOR;  Surgeon: Daneil Dolin, MD;  Location: [facility];  Service: Endoscopy;  Laterality: N/A;   ESOPHAGEAL DILATION  12/19/2018   Procedure: ESOPHAGEAL DILATION;  Surgeon: Daneil Dolin, MD;  [facility];  Service: Endoscopy;;   ESOPHAGOGASTRODUODENOSCOPY (EGD)  WITH ESOPHAGEAL DILATION  04/03/2002   IOE:[id number] ring, otherwise normal esophagus, status post dilation with 56 F/Normal stomach   ESOPHAGOGASTRODUODENOSCOPY (EGD) WITH ESOPHAGEAL DILATION N/A 11/08/2012   Dr. [name]:schatzki's ring s/p dilation/hiatal hernia   ESOPHAGOGASTRODUODENOSCOPY (EGD) WITH PROPOFOL N/A 03/04/2015   Dr.[name]- schatzki's ring, hiatal hernia, scattered erosions bx= chronic inactive gastritis   ESOPHAGOGASTRODUODENOSCOPY (EGD) WITH PROPOFOL N/A 05/06/2017   Normal esophagus. Dilated. Small hiatal hernia. Normal duodenal bulb and second portion of duodenum.   ESOPHAGOGASTRODUODENOSCOPY (EGD) WITH PROPOFOL N/A 12/19/2018   [name]: Mild [name] ring status post dilation, multiple erosions in the stomach, biopsies negative.   FINGER SURGERY     HEMORRHOID BANDING  2017    Dr.[name]   LEFT ANKLE LIGAMENT REPAIR     x 2   LEFT ELBOW REPAIR     MALONEY DILATION N/A 05/06/2017   Procedure: Venia Minks DILATION;  Surgeon: Daneil Dolin, MD;  [facility];  Service: Endoscopy;  Laterality: N/A;   MALONEY DILATION N/A 12/19/2018   Procedure: Venia Minks DILATION;  Surgeon: Daneil Dolin, MD;  [facility];  Service: Endoscopy;  Laterality: N/A;   MAXIMUM ACCESS (MAS)POSTERIOR LUMBAR INTERBODY FUSION (PLIF) 1 LEVEL N/A 04/24/2014   Procedure: Lumbar four-five Maximum access Surgery posterior lumbar interbody fusion;  Surgeon: [name], MD;  Location: [place];  Service: Neurosurgery;  Laterality: N/A;  Lumbar four-five Maximum access Surgery posterior lumbar interbody fusion   MULTIPLE EXTRACTIONS WITH ALVEOLOPLASTY N/A 06/18/2017   Procedure: MULTIPLE EXTRACTION;  Surgeon: Diona Browner, DDS;  Location: [place];  Service: Oral Surgery;  Laterality: N/A;   PARTIAL HYSTERECTOMY     POLYPECTOMY  03/04/2015   Procedure: POLYPECTOMY (descending colon);  Surgeon: Daneil Dolin, MD;  Location: [facility];  Service: Endoscopy;;   PULSE GENERATOR IMPLANT Bilateral 12/15/2013   Procedure: BILATERAL PULSE GENERATOR IMPLANT;  Surgeon: [name], MD;  Location: [place] [facility];  Service: Neurosurgery;  Laterality: Bilateral;  BILATERAL   RIGHT BREAST CYST     benign   SUBTHALAMIC STIMULATOR BATTERY REPLACEMENT Bilateral 06/24/2018   Procedure: Bilateral implantable pulse generator battery change;  Surgeon: [name], MD;  Location: [place];  Service: Neurosurgery;  Laterality: Bilateral;  bilateral   SUBTHALAMIC STIMULATOR INSERTION Bilateral 12/04/2013   Procedure: Bilateral Deep brain stimulator placement;  Surgeon: [name], MD;  Location: [place];  Service: Neurosurgery;  Laterality: Bilateral;  Bilateral Deep brain stimulator placement   TONSILLECTOMY      Current Outpatient Medications  Medication Sig Dispense Refill   acetaminophen (TYLENOL) 325  MG tablet Take 2 tablets (650 mg total) by mouth every 6 (six) hours as needed for mild pain (or Fever >/= 101). 30 tablet 1   albuterol (PROAIR HFA) 108 (90 Base) MCG/ACT inhaler Inhale 2 puffs into the lungs every 6 (six) hours as needed for wheezing or shortness of breath. 18 g 1   amoxicillin-clavulanate (AUGMENTIN) 500-125 MG tablet Take 1 tablet (500 mg total) by mouth in the morning and at bedtime. 14 tablet 0   aspirin EC 81 MG tablet Take 1 tablet (81 mg total) by mouth daily with breakfast. 30 tablet 2   Baclofen 5 MG TABS Take 5 mg by mouth at bedtime.     Carboxymethylcellulose Sod PF 0.5 % SOLN Place 1 drop into both eyes 2 (two) times daily.      clotrimazole-betamethasone (LOTRISONE) cream Apply 1 application topically 2 (two) times daily. Apply spirally twice daily to perineum and buttock  area yeast/fungal rash 30 g 0   divalproex (DEPAKOTE) 500 MG DR tablet Take 500 mg by mouth 3 (three) times daily.      fluticasone furoate-vilanterol (BREO ELLIPTA) 100-25 MCG/ACT AEPB Inhale 1 puff into the lungs in the morning and at bedtime.     Glucagon, rDNA, (GLUCAGON EMERGENCY) 1 MG KIT Inject as directed. Inject 1 mg intramuscularly every 24 hours as needed for low blood glucose blood glucose below 60 and unable to take oral glucose replacement.     guaiFENesin-dextromethorphan (ROBITUSSIN DM) 100-10 MG/5ML syrup Take 5 mLs by mouth every 4 (four) hours as needed for cough.     INGREZZA 40 MG capsule Take 40 mg by mouth daily.     ipratropium (ATROVENT) 0.03 % nasal spray Place 2 sprays into both nostrils 3 (three) times daily before meals.     ipratropium-albuterol (DUONEB) 0.5-2.5 (3) MG/3ML SOLN Take 3 mLs by nebulization every 6 (six) hours as needed (wheezing or shortness of breath).     lactulose (CHRONULAC) 10 GM/15ML solution Take 10 g by mouth daily as needed for mild constipation.     levothyroxine (SYNTHROID) 50 MCG tablet Take 1 tablet (50 mcg total) by mouth daily before  breakfast.     Menthol, Topical Analgesic, (BIOFREEZE) 4 % GEL Apply 1 application topically 2 (two) times daily. Apply to lower back     metFORMIN (GLUCOPHAGE) 500 MG tablet Take 500 mg by mouth 2 (two) times daily with a meal.     metoprolol succinate (TOPROL-XL) 50 MG 24 hr tablet Take 1 tablet (50 mg total) by mouth daily. 30 tablet 3   nystatin (MYCOSTATIN/NYSTOP) powder Apply 1 application topically every 8 (eight) hours.     Polyethyl Glycol-Propyl Glycol (SYSTANE) 0.4-0.3 % SOLN Place 1 drop into both eyes every 8 (eight) hours as needed (dry eyes).     polyethylene glycol (MIRALAX / GLYCOLAX) packet Take 17 g by mouth daily as needed for moderate constipation. (Patient taking differently: Take 17 g by mouth every 12 (twelve) hours as needed for moderate constipation.) 14 each 0   potassium chloride SA (K-DUR,KLOR-CON) 20 MEQ tablet Take 20 mEq by mouth in the morning.     primidone (MYSOLINE) 50 MG tablet Take 50 mg by mouth in the morning.      QUEtiapine (SEROQUEL) 50 MG tablet Take 1 tablet (50 mg total) by mouth at bedtime. (Patient taking differently: Take 12.5 mg by mouth at bedtime.) 30 tablet 1   sertraline (ZOLOFT) 50 MG tablet Take 75 mg by mouth daily.     sitaGLIPtin (JANUVIA) 100 MG tablet Take 100 mg by mouth in the morning.     sodium chloride (OCEAN) 0.65 % nasal spray Place 2 sprays into the nose as needed (Rhinitis). Both nostrils     sodium chloride 1 g tablet Take 1 g by mouth 2 (two) times daily.     Wheat Dextrin (BENEFIBER PO) Take by mouth. 2 tbsp once daily     OXYGEN Inhale 2-5 L into the lungs as needed (if resident oxygen is 90% or less).     sulfamethoxazole-trimethoprim (BACTRIM DS) 800-160 MG tablet Take 1 tablet by mouth daily. (Patient not taking: Reported on 03/19/2021)     No current facility-administered medications for this visit.    Allergies as of 03/19/2021 - Review Complete 03/19/2021  Allergen Reaction Noted   Ace inhibitors Other (See  Comments) 09/19/2019   Invokana [canagliflozin] Other (See Comments) 12/05/2014   Losartan Other (See  Comments) 01/22/2014   Metformin and related Diarrhea 01/18/2013   Other Other (See Comments) 07/02/2014   Latex Other (See Comments) 07/02/2014   Mucinex [guaifenesin er] Other (See Comments)    Oxycodone-acetaminophen Itching and Other (See Comments) 05/11/2011   Sulfa antibiotics Other (See Comments) 10/03/2019    Family History  Problem Relation Age of Onset   Lung cancer Father        DIED AGE 24 LUNG CA   Alcohol abuse Father    Anxiety disorder Father    Depression Father    COPD Mother        DIED AGE 14,MRSA,COPD,PNEUMONIA   Pneumonia Mother        DIED AGE 14,MRSA,COPD,PNEUMONIA   Supraventricular tachycardia Mother    Anxiety disorder Mother    Depression Mother    Alcohol abuse Mother    Brain cancer Son        Died at age 91   Heart disease Sister        DIED AGE 97, SMOKER,?HEART   COPD Brother        AGE 56   Colon cancer Neg Hx     Social History   Socioeconomic History   Marital status: Legally Separated    Spouse name: Not on file   Number of children: Not on file   Years of education: Not on file   Highest education level: Bachelor's degree (e.g., BA, AB, BS)  Occupational History   Occupation: retired    Comment: Worked as a Therapist, 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  Lives with husband in [place], [place].   1 child died brain tumor at age 58.   Two  grandchildren.   Works at [employer], patient currently lost her job.   Retired 2011.   No tobacco.   Alcohol: none in 30 yrs.  Distant history of heavy use.   No drug use.   Social Determinants of Health   Financial Resource Strain: Not on file  Food Insecurity: Not on file  Transportation Needs: Not on file  Physical Activity: Not on file  Stress: Not on file  Social Connections: Not on file    Review of Systems: Gen: Denies fever, chills, anorexia. Denies fatigue, weakness, weight loss.  CV: Denies chest pain, palpitations, syncope, peripheral edema, and claudication. Resp: Denies dyspnea at rest, cough, wheezing, coughing up blood, and pleurisy. GI: see HPI Derm: Denies rash, itching, dry skin Psych: Denies depression, anxiety, memory loss, confusion. No homicidal or suicidal ideation.  Heme: Denies bruising, bleeding, and enlarged lymph nodes.  Physical Exam: BP 103/72    Pulse 84    Temp (!) 96.8 F (36 C) (Temporal)    Ht '5\' 3"'  (1.6 m)    Wt 148 lb (67.1 kg) Comment: stated by patient   BMI 26.22 kg/m  General:  Alert and oriented. No distress noted. Pleasant and cooperative.  Head:  Normocephalic and atraumatic. Eyes:  Conjuctiva clear without scleral icterus. Mouth:  mask in place Abdomen:  +BS, soft, non-tender and non-distended. No rebound or guarding. No HSM or masses noted. Msk:  in wheelchair Extremities:  With bilateral pedal edema Neurologic:  Alert and  oriented x4 Psych:  Alert and cooperative. Normal mood and affect.  ASSESSMENT/PLAN: [name] is a 70 y.o. female presenting today with a history of GERD and constipation, resident of nursing home. Multiple health issues.   Bowel regimen still not ideal, as she is going several days without BM. Instead of having prn orders at her facility, I have requested that she have MIralax 1 capful daily, not prn. If diarrhea, may hold that day's dose.   We will have her return in 3 months  for close follow-up.  Annitta Needs, PhD, ANP-BC [facility]

## 2021-03-25 NOTE — Telephone Encounter (Signed)
Attempted to call pt to see if she ever received a flutter valve but after the line rang 3 times, the line went to a busy tone. Will try to call back later.

## 2021-03-27 NOTE — Telephone Encounter (Signed)
Will forward to triage to see if we have flutter valves to put up front for pt. Unsure if she was given one at the visit. If we have a flutter valve at the office we can call back to see if it can be picked up.

## 2021-04-16 ENCOUNTER — Telehealth: Payer: Self-pay | Admitting: Internal Medicine

## 2021-04-16 NOTE — Telephone Encounter (Signed)
Noted. Sorry to hear.

## 2021-04-16 NOTE — Telephone Encounter (Signed)
Pt is deceased. 

## 2021-05-07 DEATH — deceased

## 2021-07-22 ENCOUNTER — Ambulatory Visit: Payer: 59 | Admitting: Gastroenterology

## 2021-09-12 ENCOUNTER — Encounter: Payer: 59 | Admitting: Neurology

## 2021-11-19 NOTE — Telephone Encounter (Signed)
Closing encounter
